# Patient Record
Sex: Female | Born: 1950 | Race: White | Hispanic: No | Smoking: Never smoker
Health system: Southern US, Community
[De-identification: ages and names within clinical notes are randomized; demographics above are authoritative.]

## PROBLEM LIST (undated history)

## (undated) DIAGNOSIS — G4730 Sleep apnea, unspecified: Secondary | ICD-10-CM

## (undated) DIAGNOSIS — K589 Irritable bowel syndrome without diarrhea: Secondary | ICD-10-CM

## (undated) DIAGNOSIS — IMO0001 Reserved for inherently not codable concepts without codable children: Secondary | ICD-10-CM

## (undated) DIAGNOSIS — R7303 Prediabetes: Secondary | ICD-10-CM

## (undated) DIAGNOSIS — M51369 Other intervertebral disc degeneration, lumbar region without mention of lumbar back pain or lower extremity pain: Secondary | ICD-10-CM

## (undated) DIAGNOSIS — R202 Paresthesia of skin: Secondary | ICD-10-CM

## (undated) DIAGNOSIS — R011 Cardiac murmur, unspecified: Secondary | ICD-10-CM

## (undated) DIAGNOSIS — I509 Heart failure, unspecified: Secondary | ICD-10-CM

## (undated) DIAGNOSIS — M069 Rheumatoid arthritis, unspecified: Secondary | ICD-10-CM

## (undated) DIAGNOSIS — M2550 Pain in unspecified joint: Secondary | ICD-10-CM

## (undated) DIAGNOSIS — M1990 Unspecified osteoarthritis, unspecified site: Secondary | ICD-10-CM

## (undated) DIAGNOSIS — R1310 Dysphagia, unspecified: Secondary | ICD-10-CM

## (undated) DIAGNOSIS — Z5189 Encounter for other specified aftercare: Secondary | ICD-10-CM

## (undated) DIAGNOSIS — D649 Anemia, unspecified: Secondary | ICD-10-CM

## (undated) DIAGNOSIS — R200 Anesthesia of skin: Secondary | ICD-10-CM

## (undated) DIAGNOSIS — K449 Diaphragmatic hernia without obstruction or gangrene: Secondary | ICD-10-CM

## (undated) DIAGNOSIS — G629 Polyneuropathy, unspecified: Secondary | ICD-10-CM

## (undated) DIAGNOSIS — K219 Gastro-esophageal reflux disease without esophagitis: Secondary | ICD-10-CM

## (undated) DIAGNOSIS — M797 Fibromyalgia: Secondary | ICD-10-CM

## (undated) DIAGNOSIS — R600 Localized edema: Secondary | ICD-10-CM

## (undated) DIAGNOSIS — E559 Vitamin D deficiency, unspecified: Secondary | ICD-10-CM

## (undated) DIAGNOSIS — G9009 Other idiopathic peripheral autonomic neuropathy: Secondary | ICD-10-CM

## (undated) DIAGNOSIS — M5030 Other cervical disc degeneration, unspecified cervical region: Secondary | ICD-10-CM

## (undated) DIAGNOSIS — M3500 Sicca syndrome, unspecified: Secondary | ICD-10-CM

## (undated) DIAGNOSIS — I5032 Chronic diastolic (congestive) heart failure: Secondary | ICD-10-CM

## (undated) DIAGNOSIS — C801 Malignant (primary) neoplasm, unspecified: Secondary | ICD-10-CM

## (undated) DIAGNOSIS — N189 Chronic kidney disease, unspecified: Secondary | ICD-10-CM

## (undated) DIAGNOSIS — M5136 Other intervertebral disc degeneration, lumbar region: Secondary | ICD-10-CM

## (undated) DIAGNOSIS — I10 Essential (primary) hypertension: Secondary | ICD-10-CM

## (undated) DIAGNOSIS — G4733 Obstructive sleep apnea (adult) (pediatric): Secondary | ICD-10-CM

## (undated) DIAGNOSIS — J189 Pneumonia, unspecified organism: Secondary | ICD-10-CM

## (undated) DIAGNOSIS — I493 Ventricular premature depolarization: Secondary | ICD-10-CM

## (undated) DIAGNOSIS — K829 Disease of gallbladder, unspecified: Secondary | ICD-10-CM

## (undated) DIAGNOSIS — U071 COVID-19: Secondary | ICD-10-CM

## (undated) DIAGNOSIS — I7300 Raynaud's syndrome without gangrene: Secondary | ICD-10-CM

## (undated) DIAGNOSIS — M549 Dorsalgia, unspecified: Secondary | ICD-10-CM

## (undated) DIAGNOSIS — R0602 Shortness of breath: Secondary | ICD-10-CM

## (undated) DIAGNOSIS — E7800 Pure hypercholesterolemia, unspecified: Secondary | ICD-10-CM

## (undated) DIAGNOSIS — L509 Urticaria, unspecified: Secondary | ICD-10-CM

## (undated) HISTORY — PX: BREAST SURGERY: SHX581

## (undated) HISTORY — DX: Anesthesia of skin: R20.0

## (undated) HISTORY — DX: COVID-19: U07.1

## (undated) HISTORY — DX: Polyneuropathy, unspecified: G62.9

## (undated) HISTORY — DX: Dorsalgia, unspecified: M54.9

## (undated) HISTORY — DX: Heart failure, unspecified: I50.9

## (undated) HISTORY — DX: Rheumatoid arthritis, unspecified: M06.9

## (undated) HISTORY — DX: Prediabetes: R73.03

## (undated) HISTORY — DX: Chronic diastolic (congestive) heart failure: I50.32

## (undated) HISTORY — DX: Other cervical disc degeneration, unspecified cervical region: M50.30

## (undated) HISTORY — DX: Other idiopathic peripheral autonomic neuropathy: G90.09

## (undated) HISTORY — DX: Pure hypercholesterolemia, unspecified: E78.00

## (undated) HISTORY — DX: Unspecified osteoarthritis, unspecified site: M19.90

## (undated) HISTORY — DX: Other intervertebral disc degeneration, lumbar region without mention of lumbar back pain or lower extremity pain: M51.369

## (undated) HISTORY — DX: Pain in unspecified joint: M25.50

## (undated) HISTORY — DX: Dysphagia, unspecified: R13.10

## (undated) HISTORY — DX: Essential (primary) hypertension: I10

## (undated) HISTORY — DX: Pneumonia, unspecified organism: J18.9

## (undated) HISTORY — PX: CHOLECYSTECTOMY: SHX55

## (undated) HISTORY — PX: TONSILLECTOMY: SUR1361

## (undated) HISTORY — DX: Sjogren syndrome, unspecified: M35.00

## (undated) HISTORY — PX: BREAST EXCISIONAL BIOPSY: SUR124

## (undated) HISTORY — DX: Disease of gallbladder, unspecified: K82.9

## (undated) HISTORY — DX: Urticaria, unspecified: L50.9

## (undated) HISTORY — PX: FEMUR FRACTURE SURGERY: SHX633

## (undated) HISTORY — DX: Shortness of breath: R06.02

## (undated) HISTORY — DX: Anesthesia of skin: R20.2

## (undated) HISTORY — DX: Fibromyalgia: M79.7

## (undated) HISTORY — PX: DIAGNOSTIC LAPAROSCOPY: SUR761

## (undated) HISTORY — PX: OTHER SURGICAL HISTORY: SHX169

## (undated) HISTORY — DX: Gastro-esophageal reflux disease without esophagitis: K21.9

## (undated) HISTORY — DX: Reserved for inherently not codable concepts without codable children: IMO0001

## (undated) HISTORY — PX: KNEE ARTHROSCOPY: SUR90

## (undated) HISTORY — DX: Raynaud's syndrome without gangrene: I73.00

## (undated) HISTORY — DX: Encounter for other specified aftercare: Z51.89

## (undated) HISTORY — DX: Ventricular premature depolarization: I49.3

## (undated) HISTORY — PX: ABDOMINAL HYSTERECTOMY: SHX81

## (undated) HISTORY — DX: Other intervertebral disc degeneration, lumbar region: M51.36

## (undated) HISTORY — DX: Cardiac murmur, unspecified: R01.1

## (undated) HISTORY — DX: Irritable bowel syndrome, unspecified: K58.9

## (undated) HISTORY — DX: Localized edema: R60.0

## (undated) HISTORY — DX: Diaphragmatic hernia without obstruction or gangrene: K44.9

## (undated) HISTORY — DX: Sleep apnea, unspecified: G47.30

## (undated) HISTORY — DX: Obstructive sleep apnea (adult) (pediatric): G47.33

## (undated) HISTORY — DX: Vitamin D deficiency, unspecified: E55.9

## (undated) HISTORY — PX: TUBAL LIGATION: SHX77

## (undated) HISTORY — PX: ADENOIDECTOMY: SUR15

---
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
## 1978-08-14 HISTORY — PX: TENDON REPAIR: SHX5111

## 1998-04-16 ENCOUNTER — Encounter: Admission: RE | Admit: 1998-04-16 | Discharge: 1998-07-15 | Payer: Self-pay | Admitting: Endocrinology

## 1999-03-18 ENCOUNTER — Encounter: Payer: Self-pay | Admitting: *Deleted

## 1999-03-18 ENCOUNTER — Ambulatory Visit (HOSPITAL_COMMUNITY): Admission: RE | Admit: 1999-03-18 | Discharge: 1999-03-18 | Payer: Self-pay | Admitting: *Deleted

## 1999-04-07 ENCOUNTER — Ambulatory Visit (HOSPITAL_COMMUNITY): Admission: RE | Admit: 1999-04-07 | Discharge: 1999-04-07 | Payer: Self-pay | Admitting: *Deleted

## 1999-04-07 ENCOUNTER — Encounter: Payer: Self-pay | Admitting: *Deleted

## 1999-04-22 ENCOUNTER — Ambulatory Visit (HOSPITAL_COMMUNITY): Admission: RE | Admit: 1999-04-22 | Discharge: 1999-04-22 | Payer: Self-pay | Admitting: *Deleted

## 1999-04-22 ENCOUNTER — Encounter (INDEPENDENT_AMBULATORY_CARE_PROVIDER_SITE_OTHER): Payer: Self-pay | Admitting: Specialist

## 1999-06-20 ENCOUNTER — Other Ambulatory Visit: Admission: RE | Admit: 1999-06-20 | Discharge: 1999-06-20 | Payer: Self-pay | Admitting: Otolaryngology

## 1999-07-18 ENCOUNTER — Ambulatory Visit (HOSPITAL_BASED_OUTPATIENT_CLINIC_OR_DEPARTMENT_OTHER): Admission: RE | Admit: 1999-07-18 | Discharge: 1999-07-19 | Payer: Self-pay | Admitting: Otolaryngology

## 1999-07-18 ENCOUNTER — Encounter (INDEPENDENT_AMBULATORY_CARE_PROVIDER_SITE_OTHER): Payer: Self-pay | Admitting: Specialist

## 2001-05-03 ENCOUNTER — Encounter: Payer: Self-pay | Admitting: Gastroenterology

## 2001-05-03 ENCOUNTER — Ambulatory Visit (HOSPITAL_COMMUNITY): Admission: RE | Admit: 2001-05-03 | Discharge: 2001-05-03 | Payer: Self-pay | Admitting: Gastroenterology

## 2001-05-08 ENCOUNTER — Encounter (INDEPENDENT_AMBULATORY_CARE_PROVIDER_SITE_OTHER): Payer: Self-pay | Admitting: *Deleted

## 2001-05-08 ENCOUNTER — Ambulatory Visit (HOSPITAL_COMMUNITY): Admission: RE | Admit: 2001-05-08 | Discharge: 2001-05-08 | Payer: Self-pay | Admitting: Gastroenterology

## 2001-05-21 ENCOUNTER — Encounter: Payer: Self-pay | Admitting: Gastroenterology

## 2001-05-21 ENCOUNTER — Ambulatory Visit (HOSPITAL_COMMUNITY): Admission: RE | Admit: 2001-05-21 | Discharge: 2001-05-21 | Payer: Self-pay | Admitting: Gastroenterology

## 2001-12-06 ENCOUNTER — Encounter: Admission: RE | Admit: 2001-12-06 | Discharge: 2001-12-06 | Payer: Self-pay | Admitting: Otolaryngology

## 2001-12-06 ENCOUNTER — Encounter: Payer: Self-pay | Admitting: Otolaryngology

## 2003-02-06 ENCOUNTER — Encounter (INDEPENDENT_AMBULATORY_CARE_PROVIDER_SITE_OTHER): Payer: Self-pay | Admitting: *Deleted

## 2003-02-06 ENCOUNTER — Ambulatory Visit (HOSPITAL_COMMUNITY): Admission: RE | Admit: 2003-02-06 | Discharge: 2003-02-06 | Payer: Self-pay | Admitting: Gastroenterology

## 2003-03-06 ENCOUNTER — Ambulatory Visit (HOSPITAL_COMMUNITY): Admission: RE | Admit: 2003-03-06 | Discharge: 2003-03-06 | Payer: Self-pay | Admitting: Gastroenterology

## 2003-03-06 ENCOUNTER — Encounter (INDEPENDENT_AMBULATORY_CARE_PROVIDER_SITE_OTHER): Payer: Self-pay | Admitting: Specialist

## 2003-12-10 ENCOUNTER — Encounter: Admission: RE | Admit: 2003-12-10 | Discharge: 2003-12-10 | Payer: Self-pay | Admitting: Rheumatology

## 2004-05-30 ENCOUNTER — Ambulatory Visit (HOSPITAL_COMMUNITY): Admission: RE | Admit: 2004-05-30 | Discharge: 2004-05-30 | Payer: Self-pay | Admitting: Internal Medicine

## 2004-09-02 ENCOUNTER — Encounter: Payer: Self-pay | Admitting: Gastroenterology

## 2005-06-09 ENCOUNTER — Ambulatory Visit (HOSPITAL_COMMUNITY): Admission: RE | Admit: 2005-06-09 | Discharge: 2005-06-09 | Payer: Self-pay | Admitting: Gastroenterology

## 2005-06-09 ENCOUNTER — Encounter (INDEPENDENT_AMBULATORY_CARE_PROVIDER_SITE_OTHER): Payer: Self-pay | Admitting: *Deleted

## 2007-02-11 ENCOUNTER — Encounter: Payer: Self-pay | Admitting: Cardiology

## 2007-10-13 ENCOUNTER — Emergency Department (HOSPITAL_COMMUNITY): Admission: EM | Admit: 2007-10-13 | Discharge: 2007-10-13 | Payer: Self-pay | Admitting: Emergency Medicine

## 2009-03-29 ENCOUNTER — Encounter: Admission: RE | Admit: 2009-03-29 | Discharge: 2009-03-29 | Payer: Self-pay | Admitting: Geriatric Medicine

## 2009-12-14 ENCOUNTER — Encounter: Admission: RE | Admit: 2009-12-14 | Discharge: 2009-12-14 | Payer: Self-pay | Admitting: Otolaryngology

## 2010-04-06 ENCOUNTER — Encounter: Admission: RE | Admit: 2010-04-06 | Discharge: 2010-04-06 | Payer: Self-pay | Admitting: Rheumatology

## 2010-04-11 ENCOUNTER — Encounter: Admission: RE | Admit: 2010-04-11 | Discharge: 2010-04-11 | Payer: Self-pay | Admitting: Internal Medicine

## 2010-12-30 NOTE — Op Note (Signed)
NAME:  [name], [name]                          ACCOUNT NO.:  [id number]   MEDICAL RECORD NO.:  [id number]                   PATIENT TYPE:  AMB   LOCATION:  ENDO                                 FACILITY:  [facility]   PHYSICIAN:  Nelwyn Salisbury, M.D.               DATE OF BIRTH:  13-Jul-1951   DATE OF PROCEDURE:  02/06/2003  DATE OF DISCHARGE:                                 OPERATIVE REPORT   PROCEDURE:  Esophagogastroduodenoscopy with cold biopsies.   ENDOSCOPIST:  Nelwyn Salisbury, M.D.   INSTRUMENT USED:  Olympus video panendoscope.   INDICATIONS FOR PROCEDURE:  History of Barrett's mucosa and ongoing reflux  with diarrhea and abdominal discomfort mostly in the epigastric and  periumbilical area in a 60 year old white female, rule out peptic ulcer  disease, esophagitis, gastritis, etc.   PREPROCEDURE PREPARATION:  Informed consent was procured from the patient.  The patient fasted for eight hours prior to the procedure.   PREPROCEDURE PHYSICAL EXAMINATION:  VITAL SIGNS: Stable.  NECK:  Supple.  CHEST: Clear to auscultation. S1 and S2 regular.  ABDOMEN: Soft with normal bowel sounds.  Obese.  There is some periumbilical  tenderness on palpation.   DESCRIPTION OF PROCEDURE:  The patient was placed in the left lateral  decubitus position and sedated with 100 mcg of fentanyl and 10 mg of Versed  intravenously. Once the patient was adequately sedated and maintained on low  flow oxygen and continuous cardiac monitoring, the Olympus video  panendoscope was advanced through the mouthpiece, over the tongue, and into  the esophagus under direct vision. The entire esophagus appeared normal with  no evidence of ring, stricture, mass, esophagitis, or Barrett's mucosa. The  Barrett's mucosa seen on previous endoscopy was not longer evident. The  scope was then advanced to the stomach. A small hiatal hernia was seen on  high retroflexion. A small sessile polyp was biopsied from the mid body  of  the stomach, a flat lesion was biopsied from the antrum. No erosions,  ulcerations, masses, or polyps were seen.  Random small bowel biopsies were  done from the post bulbar area to evaluate the patient's diarrhea and rule  out sprue.  No ulcers or erosions were seen in the proximal small bowel. The  patient tolerated the procedure well without complications.   IMPRESSION:  1. Normal appearing esophagus and proximal small bowel.  2. Random small bowel biopsies done to evaluate the patient's diarrhea.  3. Small sessile polyp biopsied from mid body of the stomach and a flat     lesion biopsied from the antrum.  This bled easily, but the bleeding     stopped and this was confirmed before the scope was withdrawn.  4. Small hiatal hernia.   RECOMMENDATIONS:  1. Continue PPI's as before.  2. Await pathology results.  Further recommendations made thereafter.  3.     Avoid nonsteroidals.  4.  Antireflux measures and a weight loss program were advocated.  5. Outpatient follow-up in the next two weeks for further recommendations.                                               Nelwyn Salisbury, M.D.    JNM/MEDQ  D:  02/06/2003  T:  02/07/2003  Job:  [id number]   cc:   Harriette Bouillon, M.D.  [place] [place]  [place] [address]  Fax: [phone]

## 2010-12-30 NOTE — Op Note (Signed)
[place]. [facility]  Patient:    [name]                        MRN: [id number] Proc. Date: 07/18/99 Adm. Date:  [id number] Attending:  Beckie Salts CC:         Donneta Romberg, M.D.                           Operative Report  PREOPERATIVE DIAGNOSIS:  Left parotid mass.  POSTOPERATIVE DIAGNOSIS:  Left parotid mass.  PROCEDURE:  Left superficial parotidectomy with dissection and preservation of he facial nerve.  SURGEON:  Jefry H. Constance Holster, M.D.  ASSISTANT:  Windell Moment, M.D.  ANESTHESIA:  General endotracheal anesthesia.  COMPLICATIONS:  None.  FINDINGS:  Multilobular mass approximately 3-4 cm in diameter.  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  ESTIMATED BLOOD LOSS:  30 cc.  REFERRING PHYSICIAN:  Donneta Romberg, M.D.  BRIEF HISTORY:  This is a 60 year old lady with a history of a left-sided parotid mass.  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  The risks, benefits, alternatives, and complications of the procedure were explained to the patient, and she seemed to understand and agreed to surgery.  DESCRIPTION OF PROCEDURE:  The patient was taken to the operating room and placed on the operating table in the supine position.  Following the induction of general endotracheal anesthesia, the table was turned 90 degrees, and the patients face  was prepped and draped in a standard fashion.  A parotidectomy incision was outlined in a preauricular crease down behind the earlobe, around the mastoid process and down into a cervical crease.  Electrocautery was used to incision the skin and subcutaneous tissue.  A flap was developed anteriorly, just superficial to the parotid fascia.  The great auricular nerve was identified, and the posterior branch to the auricle was dissected out and preserved.  The parotid tail was cleaned off of the upper sternocleidomastoid muscle.  The  gland was then brought forward off of the tragal cartilage and the ear canal.  The posterior belly of he digastric nerve was identified.  The tympanomastoid suture line was identified.  Careful dissection in this area using hemostats, bipolar cautery, and dissecting scissors were used to identify the main trunk of the facial nerve.  The main trunk was then dissected out off of the overlying parotid tissue, out to the pes anserinus.  All five branches of the nerve were then dissected carefully, taking the parotid gland off laterally.  The mass was in between the upper and lower division, right at the pes, and great care was taken in this area of the dissection to make sure not to disrupt the capsule of the tumor.  There was a separate lymph node identified in the superior anterior aspect of the parotid bed that was removed as well.  The entire lateral lobe was dissected free of the five branches and sent for pathologic evaluation.  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 Then 4-0 silk ties and bipolar cautery were used as needed for hemostasis of the parotid bed.  The main trunk and upper and lower divisions were all able to be stimulated at 0.5 mA.  The wound was irrigated with saline and closed in layers  using 4-0 chromic subcutaneous and running 5-0 nylon on the skin.  A #7 [place] round Jackson-Pratt drain was left in  the wound, exited through the inferior aspect of the incision, and secured in place with a nylon suture.  The patient was awakened, extubated, and transferred to the recovery room in stable condition. DD:  07/18/99 TD:  07/19/99 Job: [id number]

## 2010-12-30 NOTE — Op Note (Signed)
[place]. [facility]  Patient:    [name], [name] Visit Number: [id number] MRN: [id number]          Service Type: END Location: ENDO Attending Physician:  Juanita Craver Proc. Date: 05/08/01 Admit Date:  05/08/2001   CC:         Sandy Salaam. Chevis Pretty., M.D.   Operative Report  DATE OF BIRTH:  03/17/1951  PROCEDURE:  Colonoscopy.  ENDOSCOPIST:  Nelwyn Salisbury, M.D.  INSTRUMENT:  Olympus video colonoscope.  INDICATIONS:  Rectal bleeding in a 60 year old white female, rule out colonic polyps, masses, hemorrhoids, etc.  PREPROCEDURE PREPARATION:  Informed consent was procured from the patient. Tpx was fasted for eight hours prior to the procedure and prepped with a bottle magnesium citrate and a gallon of Nulytely the night prior to the procedure.  PREPROCEDURE PHYSICAL EXAMINATION:  VITAL SIGNS: Stable.  NECK: Supple. CHEST: Clear to auscultation.  S1 and S2 regular.  ABDOMEN: Soft with normal bowel sounds.  DESCRIPTION OF PROCEDURE:  The patient was placed in the left lateral decubitus position and sedated with an additional 30 mg of Demerol and 3.5 mg of Versed intravenously.  Once the patient was adequately sedated and maintained on low flow oxygen and continuous cardiac monitoring, the Olympus video colonoscope was advanced from the rectum to cecum and terminal ileum without difficulty.  The entire colonic mucosa appeared healthy with a normal vascular pattern.  No erosions, ulcerations, masses, or polyps were seen. There was one isolated diverticulum seen in the left colon at about 30 cm.  IMPRESSION: 1. 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 2. Otherwise healthy appearing colon from the rectum to cecum and terminal    ileum.  RECOMMENDATIONS:  Small bowel followthrough will be done to further evaluation the patients symptoms and recommendations made as needed. Attending Physician:  Juanita Craver DD:  05/08/01 TD:   05/08/01 Job: [id number]

## 2010-12-30 NOTE — Op Note (Signed)
NAME:  NOVELLA, HUBLEY                          ACCOUNT NO.:  [id number]   MEDICAL RECORD NO.:  [id number]                   PATIENT TYPE:  AMB   LOCATION:  ENDO                                 FACILITY:  [facility]   PHYSICIAN:  Nelwyn Salisbury, M.D.               DATE OF BIRTH:  Feb 04, 1951   DATE OF PROCEDURE:  03/06/2003  DATE OF DISCHARGE:                                 OPERATIVE REPORT   PROCEDURE PERFORMED:  Colonoscopy with random biopsies.   ENDOSCOPIST:  Nelwyn Salisbury, M.D.   INSTRUMENT USED:  Olympus video colonoscope.   INDICATION FOR PROCEDURE:  A 60 year old white female with a history of  rectal bleeding and change in bowel habits, severe diarrhea, and abdominal  pain.  Rule out polyps, masses, etc.  Random biopsies are planned to rule  out collagenous versus microscopic colitis if the mucosa appears normal.   PREPROCEDURE PREPARATION:  Informed consent was procured from the patient.  The patient had fasted for eight hours prior to the procedure and prepped  with a bottle of magnesium citrate and a gallon of GoLYTELY the night prior  to the procedure.   PREPROCEDURE PHYSICAL:  VITAL SIGNS:  The patient had stable vital signs.  NECK:  Supple.  CHEST:  Clear to auscultation.  S1, S2 regular.  ABDOMEN:  Soft with normal bowel sounds.   DESCRIPTION OF PROCEDURE:  The patient was placed in the left lateral  decubitus position and sedated with 60 mcg of fentanyl and 6 mg of Versed  intravenously.  Once the patient was adequately sedate and maintained on low-  flow oxygen and continuous cardiac monitoring, the Olympus video colonoscope  was advanced from the rectum to the cecum and terminal ileum.  There was a  significant amount of residual stool in the colon.  Multiple washes were  done.  No masses, polyps, erosions, ulcerations, or diverticula were seen.  The appendiceal orifice and the ileocecal valve were identified and  photographed.  The terminal ileum appeared  normal and without lesions.  Retroflexion in the rectum revealed no abnormalities.  Random colon biopsies  were done to rule out collagenous versus microscopic colitis.  The patient  tolerated the procedure well without complications.  Small lesions could  been missed secondary to a relatively poor prep.   IMPRESSION:  1. Normal colonoscopy up to the terminal ileum.  No masses, polyps,     diverticulosis, erosions, or ulcerations noted.  2. Random colon biopsies done to rule out microscopic versus collagenous     colitis.   RECOMMENDATIONS:  1. Await pathology results.  2.     Continue a high-fiber diet with liberal fluid intake.  3. Outpatient follow-up in the next two weeks.  We will try to procure the     results of a 24-hour urine study for 5HIAA and make further     recommendations in follow-up.  Nelwyn Salisbury, M.D.    JNM/MEDQ  D:  03/06/2003  T:  03/07/2003  Job:  [id number]   cc:   Harriette Bouillon, M.D.  [place]. [address] [place]  [place] [address]  Fax: [phone]

## 2010-12-30 NOTE — Op Note (Signed)
[place]. [facility]  Patient:    [name], [name] Visit Number: [id number] MRN: [id number]          Service Type: END Location: ENDO Attending Physician:  Juanita Craver Proc. Date: 05/08/01 Admit Date:  05/08/2001   CC:         Sandy Salaam. Chevis Pretty., M.D.   Operative Report  DATE OF BIRTH:  1951-06-27  PROCEDURE:  Esophagogastroduodenoscopy with biopsies.  ENDOSCOPIST:  Nelwyn Salisbury, M.D.  INSTRUMENT:  Olympus video panendoscope.  INDICATIONS:  Previous history of Barretts in a 60 year old white female whose repeat EGD is being done in two years to rule out dysplasia.  PREPROCEDURE PREPARATION:  Informed consent was procured from the patient. The patient was fasted for eight hours prior to the procedure.  PREPROCEDURE PHYSICAL EXAMINATION:  The patient had stable vital signs.  NECK: Supple.  CHEST: Clear to auscultation.  S1 and S2 regular.  ABDOMEN: Soft with normal bowel sounds.  DESCRIPTION OF PROCEDURE:  The patient was placed in the left lateral decubitus position and sedated with 40 mg of Demerol and 4 mg of Versed intravenously.  Once the patient was adequately sedated and maintained on low flow oxygen and continuous cardiac monitoring, the Olympus video panendoscope was advanced through the mouthpiece, over the tongue, and into the esophagus under direct vision.  A small patch of what appeared to be Barretts mucosa was biopsied above the Z-line.  No other abnormalities were seen in the esophagus.  The Z-line was otherwise healthy.  On advancing the scope into the stomach, there were some prominent folds seen in the antrum.  These were biopsied for pathology.  No ulcers, erosions, masses, or polyps were seen. The proximal small bowel appeared normal.  IMPRESSION: 1. Question Barretts mucosa above the Z-line, biopsies done, results pending,    otherwise normal esophagus. 2. Prominent gastric folds in the antrum, biopsies  done, results pending. 3. No erosions, ulcerations, masses, or polyps seen.  RECOMMENDATIONS:  Await pathology results.  Proceed with colonoscopy at this time. Attending Physician:  Juanita Craver DD:  05/08/01 TD:  05/08/01 Job: [id number]

## 2010-12-30 NOTE — Op Note (Signed)
[name], [name]                ACCOUNT NO.:  [id number]   MEDICAL RECORD NO.:  [id number]          PATIENT TYPE:  AMB   LOCATION:  ENDO                         FACILITY:  [facility]   PHYSICIAN:  Nelwyn Salisbury, M.D.  DATE OF BIRTH:  June 28, 1951   DATE OF PROCEDURE:  06/09/2005  DATE OF DISCHARGE:                                 OPERATIVE REPORT   PROCEDURE:  Esophagogastroduodenoscopy with antral biopsy.   ENDOSCOPIST:  Juanita Craver, M.D.   INSTRUMENT USED:  Olympus video panendoscope.   INDICATIONS FOR PROCEDURE:  60 year old white female with a history of  Barrett's mucosa undergoing repeat EGD to rule out dysphagia.   PREPROCEDURE PREPARATION:  Informed consent was obtained from the patient.  The patient was fasted for eight hours prior to the procedure.   PREPROCEDURE PHYSICAL:  Patient with stable vital signs.  Neck supple.  Chest clear to auscultation.  S1 and S2 regular.  Abdomen soft with normal  bowel sounds.   DESCRIPTION OF PROCEDURE:  The patient was placed in the left lateral  decubitus position, sedated with 60 mcg of Fentanyl and 5 mg Versed in slow  incremental doses.  Once the patient was adequately sedated, maintained on  low flow oxygen and continuous cardiac monitoring, the Olympus video  panendoscope was advanced through the mouth piece over the tongue into the  esophagus under direct vision.  The entire esophagus appeared very healthy  with no evidence of ring, strictures, masses, esophagitis, or Barrett's  mucosa.  The scope was then advanced into the stomach.  Antral gastritis  with erosions were noted.  Biopsies were done to rule out the presence of H.  pylori.  The proximal small bowel appeared normal.  Retroflexion in the high  cardia revealed no abnormalities.   IMPRESSION:  1.Normal appearing esophagus and proximal small bowel.  2.Antral gastritis with erosions noted, biopsies done, results pending.   RECOMMENDATIONS:  1.Await pathology results.  2.Continue PPIs.  3.Treat with antibiotics if H. pylori present on biopsies.  4.Repeat EGD in the next two years or earlier if needed.      Nelwyn Salisbury, M.D.  Electronically Signed     JNM/MEDQ  D:  06/09/2005  T:  06/09/2005  Job:  [id number]   cc:   Harriette Bouillon, M.D.  Fax: [phone]

## 2011-04-04 ENCOUNTER — Encounter: Payer: Self-pay | Admitting: Cardiology

## 2011-06-13 ENCOUNTER — Encounter (HOSPITAL_COMMUNITY): Payer: Managed Care, Other (non HMO)

## 2011-06-13 ENCOUNTER — Ambulatory Visit (HOSPITAL_COMMUNITY)
Admission: RE | Admit: 2011-06-13 | Discharge: 2011-06-13 | Disposition: A | Discharge status: Home / Self Care | Payer: Managed Care, Other (non HMO) | Source: Ambulatory Visit | Attending: Orthopedic Surgery | Admitting: Orthopedic Surgery

## 2011-06-13 ENCOUNTER — Other Ambulatory Visit (HOSPITAL_COMMUNITY): Payer: Self-pay | Admitting: Orthopedic Surgery

## 2011-06-13 ENCOUNTER — Encounter (HOSPITAL_COMMUNITY): Payer: Self-pay

## 2011-06-13 DIAGNOSIS — I519 Heart disease, unspecified: Secondary | ICD-10-CM | POA: Insufficient documentation

## 2011-06-13 DIAGNOSIS — Z01818 Encounter for other preprocedural examination: Secondary | ICD-10-CM

## 2011-06-13 DIAGNOSIS — E119 Type 2 diabetes mellitus without complications: Secondary | ICD-10-CM | POA: Insufficient documentation

## 2011-06-13 DIAGNOSIS — Z01812 Encounter for preprocedural laboratory examination: Secondary | ICD-10-CM | POA: Insufficient documentation

## 2011-06-13 DIAGNOSIS — I517 Cardiomegaly: Secondary | ICD-10-CM | POA: Insufficient documentation

## 2011-06-13 LAB — URINALYSIS, ROUTINE W REFLEX MICROSCOPIC
Nitrite: NEGATIVE
Specific Gravity, Urine: 1.018 (ref 1.005–1.030)
Urobilinogen, UA: 0.2 mg/dL (ref 0.0–1.0)
pH: 6 (ref 5.0–8.0)

## 2011-06-13 LAB — SURGICAL PCR SCREEN
MRSA, PCR: NEGATIVE
Staphylococcus aureus: NEGATIVE

## 2011-06-13 LAB — COMPREHENSIVE METABOLIC PANEL
BUN: 13 mg/dL (ref 6–23)
Calcium: 9.8 mg/dL (ref 8.4–10.5)
Creatinine, Ser: 0.7 mg/dL (ref 0.50–1.10)
GFR calc Af Amer: >90 mL/min (ref >90)
Glucose, Bld: 86 mg/dL (ref 70–99)
Total Protein: 6.7 g/dL (ref 6.0–8.3)

## 2011-06-13 LAB — PROTIME-INR
INR: 0.96 (ref 0.00–1.49)
Prothrombin Time: 13 seconds (ref 11.6–15.2)

## 2011-06-13 LAB — CBC
HCT: 44.4 % (ref 36.0–46.0)
Hemoglobin: 14.9 g/dL (ref 12.0–15.0)
MCHC: 33.6 g/dL (ref 30.0–36.0)
MCV: 88.1 fL (ref 78.0–100.0)
WBC: 5.5 10*3/uL (ref 4.0–10.5)

## 2011-06-13 LAB — APTT: aPTT: 30 seconds (ref 24–37)

## 2011-06-13 NOTE — Patient Instructions (Signed)
20 AD[address] W[address]ST  06/13/2011   Your procedure is scheduled on:  06/21/11  Report to [facility] at [place] AM.  Call this number if you have problems the morning of surgery: [phone]   Remember:   Do not eat food:After Midnight. Tuesday night  Do not drink clear liquids: After Midnight.Tuesday night  Take these medicines the morning of surgery with A SIP OF WATER: Nexium with sip water, Vicodin with sip water if needed   Do not wear jewelry, make-up or nail polish.  Do not wear lotions, powders, or perfumes. You may wear deodorant.  Do not shave 48 hours prior to surgery.  Do not bring valuables to the hospital.  Contacts, dentures or bridgework may not be worn into surgery.  Leave suitcase in the car. After surgery it may be brought to your room.  For patients admitted to the hospital, checkout time is 11:00 AM the day of discharge.              Regular soap face and privates,  No shaving x 48 hrs pre soap shower  Patients discharged the day of surgery will not be allowed to drive home.  Name and phone number of your driver: husband/- going to [employer] in [place] and bring it with you on the day of surgery.   Please read over the following fact sheets that you were given: Blood Transfusion Information

## 2011-06-14 NOTE — Pre-Procedure Instructions (Signed)
Eccho report and stress test 8/12 on chart with clearence Dr Grace Isaac RN

## 2011-06-16 MED ORDER — CHLORHEXIDINE GLUCONATE 4 % EX LIQD: 60.0000 mL | Freq: Once | CUTANEOUS | Status: DC

## 2011-06-20 ENCOUNTER — Other Ambulatory Visit: Payer: Self-pay | Admitting: Orthopedic Surgery

## 2011-06-20 MED ORDER — BUPIVACAINE 0.25 % ON-Q PUMP SINGLE CATH 300ML: 300.0000 mL | INJECTION | Status: DC

## 2011-06-20 MED ORDER — BUPIVACAINE 0.25 % ON-Q PUMP SINGLE CATH 300ML
300.0000 mL | INJECTION | Status: DC
  Administered 2011-06-21: 300 mL
  Filled 2011-06-20: qty 300

## 2011-06-20 NOTE — H&P (Signed)
[name] DOB: 09-Oct-1950  Date of Admission: 06/21/2011   History of Present Illness The patient is a 60 year old female who comes in today for a preoperative History and Physical. The patient is scheduled for a right total knee arthroplasty to be performed by Dr. Dione Plover. Aluisio, MD at [facility] on 06/21/2011 .  Allergies IV contrast DEMEROL. IV ADHESIVE. (CAN USE PAPER TAPE AND STERI-STRIPS) Morphine Derivatives. NO ALLERGY but intolerance - not very effective  Medications Lyrica Phenergan Plaquenil Nexium Vicodin Folic Acid Hyscomine Lidocaine Vit. D Enbrel  Family History Cancer. father and grandmother fathers side Cerebrovascular Accident. grandfather mothers side mother and grandfather mothers side Chronic Obstructive Lung Disease. father Congestive Heart Failure. grandmother fathers side and grandfather fathers side sister and grandfather fathers side Depression. mother Heart Disease. mother, brother, grandmother fathers side and grandfather fathers side mother, sister, grandmother fathers side and grandfather fathers side Heart disease in female family member before age 72 Hypertension. mother, brother, grandfather mothers side, grandmother fathers side and grandfather fathers side mother, brother, grandmother mothers side, grandmother fathers side, grandfather fathers side and child Osteoarthritis. mother, brother, grandmother mothers side and grandmother fathers side Rheumatoid Arthritis. grandmother fathers side Severe allergy. child   Social History Alcohol use. current drinker; drinks beer, wine and hard liquor; only occasionally per week current drinker; drinks wine and hard liquor; only occasionally per week Children. 2 Current work status. disabled Drug/Alcohol Rehab (Currently). no Copy of Drug/Alcohol Rehab (Previously). no Exercise. Exercises rarely; does running / walking and other Exercises  weekly; does running / walking Illicit drug use. no Living situation. live with spouse Marital status. married Number of flights of stairs before winded. greater than 5 2-3 Pain Contract. yes Tobacco / smoke exposure. no yes outdoors only Tobacco use. never smoker   Past Surgical History Ankle Surgery. Date: 77. left, tendon repair Appendectomy Arthroscopy of Knee. right, 1998 and 2001 Breast Biopsy. left Colon Polyp Removal - Colonoscopy Dilation and Curettage of Uterus Gallbladder Surgery. laporoscopic Hysterectomy. Date: 73. partial (non-cancerous) Tonsillectomy. Date: 31. Tubal Ligation Paotid Gland Tumor Removal (Noncancerous) . Date: 28. Ovary Removal - Both Bladder Tack Procedure. Date: 64. Wisdom Teeth Extraction. Date: 58.  Medical History Cardiac Arrhythmia . Frequent PVC's Chronic Pain Diabetes Mellitus, Type II. Diet-Controlled Fibromyalgia Gastroesophageal Reflux Disease Hypercholesterolemia Irritable bowel syndrome Migraine Headache Osteoarthritis Osteoporosis Peripheral Neuropathy Rheumatoid Arthritis Skin Cancer. Basal Cell Sleep Apnea. CPAP Barrett's Esophagus Hiatal Hernia Past History of Gastritis Urinary Incontinence. 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 Skin:Not Present- Itching, Rash, Skin Color Changes, Ulcer, Psoriasis and Change in Hair or Nails. HEENT:Not Present- Sensitivity to light, Hearing problems, Nose Bleed and Ringing in the Ears. Neck:Present- Swollen Glands. Not Present- Neck Mass. Respiratory:Not Present- Snoring, Chronic Cough, Bloody sputum and Dyspnea. Cardiovascular:Present- Leg Cramps and Palpitations. Not Present- Shortness of Breath, Chest Pain and Swelling of  Extremities. Gastrointestinal:Not Present- Bloody Stool, Heartburn, Abdominal Pain, Vomiting, Nausea and Incontinence of Stool. Female Genitourinary:Not Present- Blood in Urine, Menstrual Irregularities, Frequency, Incontinence and Nocturia. Musculoskeletal:Present- Joint Stiffness and Joint Pain. Not Present- Muscle Weakness, Muscle Pain, Joint Swelling and Back Pain. Neurological:Present- Tingling and Numbness. Not Present- Burning, Tremor, Headaches and Dizziness. Psychiatric:Not Present- Anxiety, Depression and Memory Loss. Endocrine:Present- Cold Intolerance. Not Present- Heat Intolerance, Excessive hunger and Excessive Thirst. Hematology:Present- Easy Bruising. Not Present-  Abnormal Bleeding, Anemia and Blood Clots.   Vitals Weight: 239 lb Height: 61 in Weight was reported by patient. Height was reported by patient. Body Surface Area: 2.16 m Body Mass Index: 45.16 kg/m Pulse: 68 (Regular) Resp.: 12 (Unlabored) BP: 118/72 (Sitting, Right Arm, Standard)    Physical Exam The physical exam findings are as follows:GENERAL: Patient is a 60 y.o. female, well-nourished, well-developed, no acute distress. Alert, oriented, cooperative. HENT:  Normocephalic, atraumatic. Pupils round and reactive. EOMs intact. NECK:  Supple, no bruits. CHEST:  Clear to anterior and posterior chest walls. No rhonchi, rales, wheezes.  Scoliosis noted. HEART:  Regular, rate and rhythm with occasional ectopic beat.  No murmurs.  S1 and S2 noted. ABDOMEN:  Soft, nontender, bowel sounds present. RECTAL/BREAST/GENITALIA:  Not done, not pertinent to present illness. EXTREMITIES:  Lower Extremity Knee/Patella:Examination of right knee reveals - no swelling or edema. Inspection and Palpation:Crepitus- coarse, (R). Examination reveals - no ecchymosis, (R). Assessment of pain reveals the following findings:: Location:Right- over the lateral aspect and over the medial  aspect. ROJM: Right: Flexion:AROM- 95 . Extension:AROM- 10 .   Assessment & Plan Osteoarthritis, knee (715.96) Impression: Right Knee  Patient to undergo a Right Total Knee Replacement. Risks and benefits of the surgery have been discussed with the patient and they elect to proceed with surgery.  There are on active contraindications to upcoming procedure such as ongoing infection or progressive neurological disease. Patient wants to look into skilled rehab, possible [facility].

## 2011-06-21 ENCOUNTER — Encounter (HOSPITAL_COMMUNITY)
Admission: RE | Disposition: A | Discharge status: Skilled Nursing Facility | Payer: Self-pay | Source: Ambulatory Visit | Attending: Orthopedic Surgery

## 2011-06-21 ENCOUNTER — Inpatient Hospital Stay (HOSPITAL_COMMUNITY): Payer: Managed Care, Other (non HMO) | Admitting: Anesthesiology

## 2011-06-21 ENCOUNTER — Encounter (HOSPITAL_COMMUNITY): Payer: Self-pay | Admitting: *Deleted

## 2011-06-21 ENCOUNTER — Encounter (HOSPITAL_COMMUNITY): Payer: Self-pay | Admitting: Orthopedic Surgery

## 2011-06-21 ENCOUNTER — Inpatient Hospital Stay (HOSPITAL_COMMUNITY)
Admission: RE | Admit: 2011-06-21 | Discharge: 2011-06-24 | DRG: 470 | Disposition: A | Discharge status: Skilled Nursing Facility | Payer: Managed Care, Other (non HMO) | Source: Ambulatory Visit | Attending: Orthopedic Surgery | Admitting: Orthopedic Surgery

## 2011-06-21 ENCOUNTER — Encounter (HOSPITAL_COMMUNITY): Payer: Self-pay | Admitting: Anesthesiology

## 2011-06-21 DIAGNOSIS — Z96651 Presence of right artificial knee joint: Secondary | ICD-10-CM

## 2011-06-21 DIAGNOSIS — K219 Gastro-esophageal reflux disease without esophagitis: Secondary | ICD-10-CM | POA: Diagnosis present

## 2011-06-21 DIAGNOSIS — M1711 Unilateral primary osteoarthritis, right knee: Secondary | ICD-10-CM

## 2011-06-21 DIAGNOSIS — IMO0001 Reserved for inherently not codable concepts without codable children: Secondary | ICD-10-CM | POA: Diagnosis present

## 2011-06-21 DIAGNOSIS — E119 Type 2 diabetes mellitus without complications: Secondary | ICD-10-CM | POA: Diagnosis present

## 2011-06-21 DIAGNOSIS — M1712 Unilateral primary osteoarthritis, left knee: Secondary | ICD-10-CM

## 2011-06-21 DIAGNOSIS — M3500 Sicca syndrome, unspecified: Secondary | ICD-10-CM | POA: Diagnosis present

## 2011-06-21 DIAGNOSIS — G609 Hereditary and idiopathic neuropathy, unspecified: Secondary | ICD-10-CM | POA: Diagnosis present

## 2011-06-21 DIAGNOSIS — E7800 Pure hypercholesterolemia, unspecified: Secondary | ICD-10-CM | POA: Diagnosis present

## 2011-06-21 DIAGNOSIS — M1710 Unilateral primary osteoarthritis, unspecified knee: Principal | ICD-10-CM | POA: Diagnosis present

## 2011-06-21 DIAGNOSIS — I7300 Raynaud's syndrome without gangrene: Secondary | ICD-10-CM | POA: Diagnosis present

## 2011-06-21 HISTORY — PX: TOTAL KNEE ARTHROPLASTY: SHX125

## 2011-06-21 LAB — TYPE AND SCREEN: Antibody Screen: NEGATIVE

## 2011-06-21 LAB — GLUCOSE, CAPILLARY
Glucose-Capillary: 104 mg/dL — ABNORMAL HIGH (ref 70–99)
Glucose-Capillary: 108 mg/dL — ABNORMAL HIGH (ref 70–99)
Glucose-Capillary: 121 mg/dL — ABNORMAL HIGH (ref 70–99)

## 2011-06-21 SURGERY — ARTHROPLASTY, KNEE, TOTAL
Anesthesia: Spinal | Site: Knee | Laterality: Right | Wound class: Clean

## 2011-06-21 MED ORDER — PREGABALIN 75 MG PO CAPS
150.0000 mg | ORAL_CAPSULE | Freq: Every day | ORAL | Status: DC
  Administered 2011-06-21 – 2011-06-23 (×3): 150 mg via ORAL
  Filled 2011-06-21 (×3): qty 2

## 2011-06-21 MED ORDER — METHOCARBAMOL 500 MG PO TABS
500.0000 mg | ORAL_TABLET | Freq: Four times a day (QID) | ORAL | Status: DC | PRN
  Administered 2011-06-21 – 2011-06-24 (×8): 500 mg via ORAL
  Filled 2011-06-21 (×9): qty 1

## 2011-06-21 MED ORDER — NALOXONE HCL 0.4 MG/ML IJ SOLN: 0.4000 mg | INTRAMUSCULAR | Status: DC | PRN

## 2011-06-21 MED ORDER — CEFAZOLIN SODIUM-DEXTROSE 2-3 GM-% IV SOLR
2.0000 g | INTRAVENOUS | Status: AC
  Administered 2011-06-21: 2 g via INTRAVENOUS
  Filled 2011-06-21: qty 50

## 2011-06-21 MED ORDER — CEFAZOLIN SODIUM 1-5 GM-% IV SOLN
1.0000 g | Freq: Four times a day (QID) | INTRAVENOUS | Status: AC
  Administered 2011-06-21 – 2011-06-22 (×3): 1 g via INTRAVENOUS
  Filled 2011-06-21 (×6): qty 50

## 2011-06-21 MED ORDER — PROPOFOL 10 MG/ML IV EMUL
INTRAVENOUS | Status: DC | PRN
  Administered 2011-06-21: 100 ug/kg/min via INTRAVENOUS

## 2011-06-21 MED ORDER — PROMETHAZINE HCL 25 MG/ML IJ SOLN: 6.2500 mg | INTRAMUSCULAR | Status: DC | PRN

## 2011-06-21 MED ORDER — DIPHENHYDRAMINE HCL 50 MG/ML IJ SOLN: 12.5000 mg | Freq: Four times a day (QID) | INTRAMUSCULAR | Status: DC | PRN

## 2011-06-21 MED ORDER — HYOSCYAMINE SULFATE ER 0.375 MG PO TB12
0.3750 mg | ORAL_TABLET | Freq: Two times a day (BID) | ORAL | Status: DC
  Administered 2011-06-21 – 2011-06-24 (×6): 0.375 mg via ORAL
  Filled 2011-06-21 (×8): qty 1

## 2011-06-21 MED ORDER — TEMAZEPAM 15 MG PO CAPS: 15.0000 mg | ORAL_CAPSULE | Freq: Every evening | ORAL | Status: DC | PRN

## 2011-06-21 MED ORDER — MAGNESIUM HYDROXIDE 400 MG/5ML PO SUSP: 30.0000 mL | Freq: Two times a day (BID) | ORAL | Status: DC | PRN

## 2011-06-21 MED ORDER — SODIUM CHLORIDE 0.9 % IR SOLN
Status: DC | PRN
  Administered 2011-06-21: 1000 mL

## 2011-06-21 MED ORDER — METHOCARBAMOL 100 MG/ML IJ SOLN
500.0000 mg | Freq: Four times a day (QID) | INTRAVENOUS | Status: DC | PRN
  Filled 2011-06-21: qty 5

## 2011-06-21 MED ORDER — HYDROMORPHONE 0.3 MG/ML IV SOLN
INTRAVENOUS | Status: DC
  Administered 2011-06-21 (×2): 7.5 mg via INTRAVENOUS
  Administered 2011-06-21: 2.29 mg via INTRAVENOUS
  Administered 2011-06-21: 1.19 mg via INTRAVENOUS
  Administered 2011-06-22: 1.6 mg via INTRAVENOUS
  Filled 2011-06-21 (×2): qty 25

## 2011-06-21 MED ORDER — DOCUSATE SODIUM 100 MG PO CAPS
100.0000 mg | ORAL_CAPSULE | Freq: Two times a day (BID) | ORAL | Status: DC
  Administered 2011-06-21 – 2011-06-24 (×6): 100 mg via ORAL
  Filled 2011-06-21 (×8): qty 1

## 2011-06-21 MED ORDER — RIVAROXABAN 10 MG PO TABS
10.0000 mg | ORAL_TABLET | ORAL | Status: DC
  Administered 2011-06-22 – 2011-06-24 (×3): 10 mg via ORAL
  Filled 2011-06-21 (×3): qty 1

## 2011-06-21 MED ORDER — ONDANSETRON HCL 4 MG PO TABS: 4.0000 mg | ORAL_TABLET | Freq: Four times a day (QID) | ORAL | Status: DC | PRN

## 2011-06-21 MED ORDER — FENTANYL CITRATE 0.05 MG/ML IJ SOLN
INTRAMUSCULAR | Status: DC | PRN
  Administered 2011-06-21: 50 ug via INTRAVENOUS

## 2011-06-21 MED ORDER — METHOCARBAMOL 100 MG/ML IJ SOLN
500.0000 mg | INTRAMUSCULAR | Status: AC
  Administered 2011-06-21: 500 mg via INTRAVENOUS
  Filled 2011-06-21: qty 5

## 2011-06-21 MED ORDER — DIPHENHYDRAMINE HCL 12.5 MG/5ML PO ELIX: 12.5000 mg | ORAL_SOLUTION | Freq: Four times a day (QID) | ORAL | Status: DC | PRN

## 2011-06-21 MED ORDER — SODIUM CHLORIDE 0.9 % IJ SOLN: 9.0000 mL | INTRAMUSCULAR | Status: DC | PRN

## 2011-06-21 MED ORDER — LACTATED RINGERS IV SOLN
INTRAVENOUS | Status: DC | PRN
  Administered 2011-06-21 (×2): via INTRAVENOUS

## 2011-06-21 MED ORDER — BISACODYL 5 MG PO TBEC: 10.0000 mg | DELAYED_RELEASE_TABLET | Freq: Every day | ORAL | Status: DC | PRN

## 2011-06-21 MED ORDER — PANTOPRAZOLE SODIUM 40 MG PO TBEC
80.0000 mg | DELAYED_RELEASE_TABLET | Freq: Every day | ORAL | Status: DC
  Administered 2011-06-21 – 2011-06-23 (×3): 80 mg via ORAL
  Filled 2011-06-21 (×5): qty 2

## 2011-06-21 MED ORDER — ACETAMINOPHEN 650 MG RE SUPP: 650.0000 mg | Freq: Four times a day (QID) | RECTAL | Status: DC | PRN

## 2011-06-21 MED ORDER — PREGABALIN 75 MG PO CAPS
75.0000 mg | ORAL_CAPSULE | Freq: Two times a day (BID) | ORAL | Status: DC
  Administered 2011-06-21 – 2011-06-24 (×6): 75 mg via ORAL
  Filled 2011-06-21 (×6): qty 1

## 2011-06-21 MED ORDER — PROMETHAZINE HCL 25 MG PO TABS: 25.0000 mg | ORAL_TABLET | Freq: Four times a day (QID) | ORAL | Status: DC | PRN

## 2011-06-21 MED ORDER — METOCLOPRAMIDE HCL 5 MG/ML IJ SOLN: 5.0000 mg | Freq: Three times a day (TID) | INTRAMUSCULAR | Status: DC | PRN

## 2011-06-21 MED ORDER — MIDAZOLAM HCL 5 MG/5ML IJ SOLN
INTRAMUSCULAR | Status: DC | PRN
  Administered 2011-06-21: 2 mg via INTRAVENOUS

## 2011-06-21 MED ORDER — POLYETHYLENE GLYCOL 3350 17 G PO PACK
17.0000 g | PACK | Freq: Every day | ORAL | Status: DC | PRN
  Filled 2011-06-21: qty 1

## 2011-06-21 MED ORDER — METHYLPREDNISOLONE ACETATE PF 80 MG/ML IJ SUSP
INTRAMUSCULAR | Status: DC | PRN
  Administered 2011-06-21: 80 mg

## 2011-06-21 MED ORDER — ACETAMINOPHEN 10 MG/ML IV SOLN
1000.0000 mg | Freq: Four times a day (QID) | INTRAVENOUS | Status: AC
  Administered 2011-06-21 – 2011-06-22 (×4): 1000 mg via INTRAVENOUS
  Filled 2011-06-21 (×5): qty 100

## 2011-06-21 MED ORDER — PANTOPRAZOLE SODIUM 40 MG PO TBEC: 40.0000 mg | DELAYED_RELEASE_TABLET | Freq: Every day | ORAL | Status: DC

## 2011-06-21 MED ORDER — OXYCODONE HCL 5 MG PO TABS
5.0000 mg | ORAL_TABLET | ORAL | Status: DC | PRN
  Administered 2011-06-22 – 2011-06-24 (×11): 10 mg via ORAL
  Filled 2011-06-21 (×12): qty 2

## 2011-06-21 MED ORDER — BISACODYL 10 MG RE SUPP: 10.0000 mg | Freq: Every day | RECTAL | Status: DC | PRN

## 2011-06-21 MED ORDER — KCL IN DEXTROSE-NACL 20-5-0.45 MEQ/L-%-% IV SOLN
INTRAVENOUS | Status: DC
  Administered 2011-06-21 – 2011-06-23 (×3): via INTRAVENOUS
  Filled 2011-06-21 (×9): qty 1000

## 2011-06-21 MED ORDER — ONDANSETRON HCL 4 MG/2ML IJ SOLN: 4.0000 mg | Freq: Four times a day (QID) | INTRAMUSCULAR | Status: DC | PRN

## 2011-06-21 MED ORDER — MENTHOL 3 MG MT LOZG
1.0000 | LOZENGE | OROMUCOSAL | Status: DC | PRN
  Filled 2011-06-21: qty 9

## 2011-06-21 MED ORDER — PHENOL 1.4 % MT LIQD
1.0000 | OROMUCOSAL | Status: DC | PRN
  Filled 2011-06-21: qty 177

## 2011-06-21 MED ORDER — ACETAMINOPHEN 325 MG PO TABS
650.0000 mg | ORAL_TABLET | Freq: Four times a day (QID) | ORAL | Status: DC | PRN
  Administered 2011-06-22 – 2011-06-24 (×2): 650 mg via ORAL
  Filled 2011-06-21 (×2): qty 2

## 2011-06-21 MED ORDER — DIPHENHYDRAMINE HCL 12.5 MG/5ML PO ELIX: 12.5000 mg | ORAL_SOLUTION | ORAL | Status: DC | PRN

## 2011-06-21 MED ORDER — FLEET ENEMA 7-19 GM/118ML RE ENEM: 1.0000 | ENEMA | Freq: Every day | RECTAL | Status: DC | PRN

## 2011-06-21 MED ORDER — BUPIVACAINE HCL 0.75 % IJ SOLN
INTRAMUSCULAR | Status: DC | PRN
  Administered 2011-06-21: 1.7 mL

## 2011-06-21 MED ORDER — CEFAZOLIN SODIUM 1-5 GM-% IV SOLN: 1.0000 g | Freq: Four times a day (QID) | INTRAVENOUS | Status: DC

## 2011-06-21 MED ORDER — EPHEDRINE SULFATE 50 MG/ML IJ SOLN
INTRAMUSCULAR | Status: DC | PRN
  Administered 2011-06-21: 10 mg via INTRAVENOUS

## 2011-06-21 MED ORDER — CYCLOSPORINE 0.05 % OP EMUL
2.0000 [drp] | Freq: Two times a day (BID) | OPHTHALMIC | Status: DC
  Administered 2011-06-21 – 2011-06-24 (×6): 2 [drp] via OPHTHALMIC
  Filled 2011-06-21 (×10): qty 1

## 2011-06-21 MED ORDER — METOCLOPRAMIDE HCL 10 MG PO TABS: 5.0000 mg | ORAL_TABLET | Freq: Three times a day (TID) | ORAL | Status: DC | PRN

## 2011-06-21 MED ORDER — BUPIVACAINE ON-Q PAIN PUMP (FOR ORDER SET NO CHG)
INJECTION | Status: DC
  Filled 2011-06-21: qty 1

## 2011-06-21 MED ORDER — HYDROMORPHONE HCL PF 1 MG/ML IJ SOLN
0.2500 mg | INTRAMUSCULAR | Status: DC | PRN
  Administered 2011-06-21 (×2): 0.5 mg via INTRAVENOUS

## 2011-06-21 MED ORDER — ACETAMINOPHEN 10 MG/ML IV SOLN
INTRAVENOUS | Status: DC | PRN
  Administered 2011-06-21: 1000 mg via INTRAVENOUS

## 2011-06-21 SURGICAL SUPPLY — 60 items
BAG SPEC THK2 15X12 ZIP CLS (MISCELLANEOUS) ×1
BAG ZIPLOCK 12X15 (MISCELLANEOUS) ×2 IMPLANT
BANDAGE ELASTIC 6 VELCRO ST LF (GAUZE/BANDAGES/DRESSINGS) ×2 IMPLANT
BANDAGE ESMARK 6X9 LF (GAUZE/BANDAGES/DRESSINGS) ×1 IMPLANT
BLADE SAG 18X100X1.27 (BLADE) ×2 IMPLANT
BLADE SAW SGTL 11.0X1.19X90.0M (BLADE) ×2 IMPLANT
BNDG CMPR 9X6 STRL LF SNTH (GAUZE/BANDAGES/DRESSINGS) ×1
BNDG ESMARK 6X9 LF (GAUZE/BANDAGES/DRESSINGS) ×2
BOWL SMART MIX CTS (DISPOSABLE) ×2 IMPLANT
CATH KIT ON-Q SILVERSOAK 5 (CATHETERS) ×1 IMPLANT
CATH KIT ON-Q SILVERSOAK 5IN (CATHETERS) ×2 IMPLANT
CEMENT HV SMART SET (Cement) ×4 IMPLANT
CEMENT TIBIA MBT SIZE 2.5 (Knees) IMPLANT
CLOTH BEACON ORANGE TIMEOUT ST (SAFETY) ×2 IMPLANT
CUFF TOURN SGL QUICK 34 (TOURNIQUET CUFF) ×2
CUFF TRNQT CYL 34X4X40X1 (TOURNIQUET CUFF) ×1 IMPLANT
DRAPE EXTREMITY T 121X128X90 (DRAPE) ×2 IMPLANT
DRAPE POUCH INSTRU U-SHP 10X18 (DRAPES) ×2 IMPLANT
DRAPE U-SHAPE 47X51 STRL (DRAPES) ×2 IMPLANT
DRSG ADAPTIC 3X8 NADH LF (GAUZE/BANDAGES/DRESSINGS) ×2 IMPLANT
DRSG TEGADERM 4X4.75 (GAUZE/BANDAGES/DRESSINGS) ×1 IMPLANT
DURAPREP 26ML APPLICATOR (WOUND CARE) ×2 IMPLANT
ELECT REM PT RETURN 9FT ADLT (ELECTROSURGICAL) ×2
ELECTRODE REM PT RTRN 9FT ADLT (ELECTROSURGICAL) ×1 IMPLANT
EVACUATOR 1/8 PVC DRAIN (DRAIN) ×2 IMPLANT
FACESHIELD LNG OPTICON STERILE (SAFETY) ×10 IMPLANT
FEMUR SIGMA PS SZ 3.0 R (Femur) ×1 IMPLANT
GLOVE BIO SURGEON STRL SZ7.5 (GLOVE) ×2 IMPLANT
GLOVE BIO SURGEON STRL SZ8 (GLOVE) ×2 IMPLANT
GLOVE BIOGEL PI IND STRL 8 (GLOVE) ×2 IMPLANT
GLOVE BIOGEL PI INDICATOR 8 (GLOVE) ×2
GOWN PREVENTION PLUS XLARGE (GOWN DISPOSABLE) ×2 IMPLANT
GOWN STRL REIN XL XLG (GOWN DISPOSABLE) ×2 IMPLANT
HANDPIECE INTERPULSE COAX TIP (DISPOSABLE) ×2
IMMOBILIZER KNEE 20 (SOFTGOODS) ×2
IMMOBILIZER KNEE 20 THIGH 36 (SOFTGOODS) ×1 IMPLANT
IMMOBILIZER KNEE 22 UNIV (SOFTGOODS) ×1 IMPLANT
KIT BASIN OR (CUSTOM PROCEDURE TRAY) ×2 IMPLANT
MANIFOLD NEPTUNE II (INSTRUMENTS) ×2 IMPLANT
NS IRRIG 1000ML POUR BTL (IV SOLUTION) ×2 IMPLANT
PACK TOTAL JOINT (CUSTOM PROCEDURE TRAY) ×2 IMPLANT
PAD ABD 7.5X8 STRL (GAUZE/BANDAGES/DRESSINGS) ×2 IMPLANT
PADDING CAST COTTON 6X4 STRL (CAST SUPPLIES) ×6 IMPLANT
PADDING WEBRIL 6 STERILE (GAUZE/BANDAGES/DRESSINGS) ×1 IMPLANT
PATELLA DOME PFC 35MM (Knees) ×2 IMPLANT
PLATE ROT INSERT 12.5MM SIZE 3 (Plate) ×2 IMPLANT
POSITIONER SURGICAL ARM (MISCELLANEOUS) ×2 IMPLANT
SET HNDPC FAN SPRY TIP SCT (DISPOSABLE) ×1 IMPLANT
SPONGE GAUZE 4X4 12PLY (GAUZE/BANDAGES/DRESSINGS) ×2 IMPLANT
STRIP CLOSURE SKIN 1/2X4 (GAUZE/BANDAGES/DRESSINGS) ×4 IMPLANT
SUCTION FRAZIER 12FR DISP (SUCTIONS) ×2 IMPLANT
SUT MNCRL AB 4-0 PS2 18 (SUTURE) ×2 IMPLANT
SUT PDS AB 1 CT1 27 (SUTURE) ×6 IMPLANT
SUT VIC AB 2-0 CT1 27 (SUTURE) ×6
SUT VIC AB 2-0 CT1 TAPERPNT 27 (SUTURE) ×3 IMPLANT
TIBIA MBT CEMENT SIZE 2.5 (Knees) ×2 IMPLANT
TOWEL OR 17X26 10 PK STRL BLUE (TOWEL DISPOSABLE) ×4 IMPLANT
TRAY FOLEY CATH 14FRSI W/METER (CATHETERS) ×2 IMPLANT
WATER STERILE IRR 1500ML POUR (IV SOLUTION) ×2 IMPLANT
WRAP KNEE MAXI GEL POST OP (GAUZE/BANDAGES/DRESSINGS) ×2 IMPLANT

## 2011-06-21 NOTE — Op Note (Addendum)
Pre-operative diagnosis- Osteoarthritis Right knee(s)  Post-operative diagnosis- Osteoarthritis Right knee(s)  Surgeon- Dione Plover. Orbin Mayeux, MD  Assistant- Arlee Muslim, PA-C  Anesthesia- Spinal  EBL minimal  Drains Hemovac  Tourniquet time- 31 minutes at [id number]  Complications- None  Condition Stable to PACU  Brief Clinical Note Brief Clinical Note [name] is a 60 y.o. year old female with end stage OA of his left knee with progressively worsening pain and dysfunction. She has constant pain, with activity and at rest and significant functional deficits with difficulties even with ADLs. Se has had extensive non-op management including analgesics, injections of cortisone and viscosupplements, and home exercise program, but remains in significant pain with significant dysfunction. SHe has bone on bone arthritis in the medial and patellofemoral compartments and tibial subluxation on the femur. She presents now for left Total Knee Arthroplasty.  Procedure in detail---  The patient is brought into the operating room and positioned supine on the operating table. After successful administration of Spinal, a tourniquet is placed high on the Right thigh(s) and the lower extremity is prepped and draped in the usual sterile fashion. Time out is performed by the operating team and then the Right lower extremity is wrapped in Esmarch, knee flexed and the tourniquet inflated to 300 mmHg.  A midline incision is made with a ten blade through the subcutaneous tissue to the level of the extensor mechanism. A fresh blade is used to make a medial parapatellar arthrotomy. Soft tissue over the proximal medial tibia is subperiosteally elevated to the joint line with a knife and into the semimembranosus bursa with a Cobb elevator. Soft tissue over the proximal lateral tibia is elevated with attention being paid to avoiding the patellar tendon on the tibial tubercle. The patella is everted, knee flexed 90 degrees and the  ACL and PCL are removed. Findings are bone on bone arthritis of the medial and patellofemoral compartments with marginal osteophytes off the tibia.  The drill is used to create a starting hole in the distal femur and the canal is thoroughly irrigated with sterile saline to remove the fatty contents. The 5 degree Right valgus alignment guide is placed into the femoral canal and the distal femoral cutting block is pinned to remove 11 mm off the distal femur. Resection is made with an oscillating saw.  The tibia is subluxed forward and the menisci are removed. The extramedullary alignment guide is placed referencing proximally at the medial aspect of the tibial tubercle and distally along the second metatarsal axis and tibial crest. The block is pinned to remove 60mm off the more deficient medial side. Resection is made with an oscillating saw. Size 2.5is the most appropriate size for the tibia and the proximal tibia is prepared with the modular drill and keel punch for that size.  The femoral sizing guide is placed and size 3 is most appropriate. Rotation is marked off the epicondylar axis and confirmed by creating a rectangular flexion gap at 90 degrees. The size 3 cutting block is pinned in this rotation and the anterior, posterior and chamfer cuts are made with the oscillating saw. The intercondylar block is then placed and that cut is made.  Trial size 2.5 tibial component, trial size 3 posterior stabilized femur and a 12.5 mm posterior stabilized rotating platform insert trial is placed. Full extension is achieved with excellent varus/valgus and anterior/posterior balance throughout full range of motion. The patella is everted and thickness measured to be 22 mm. Free hand resection is taken  to 12 mm, a 35 template is placed, lug holes are drilled, trial patella is placed, and it tracks normally. Osteophytes are removed off the posterior femur with the trial in place. All trials are removed and the cut bone  surfaces prepared with pulsatile lavage. Cement is mixed and once ready for implantation, the size 2.5 tibial implant, size 3 posterior stabilized femoral component, and the size 35 patella are cemented in place and the patella is held with the clamp. The trial insert is placed and the knee held in full extension. All extruded cement is removed and once the cement is hard the permanent 12.5 mm posterior stabilized rotating platform insert is placed into the tibial tray.  The wound is copiously irrigated with saline solution and the extensor mechanism closed over a hemovac drain with #1 PDS suture. The tourniquet is released for a total tourniquet time of 31 minutes. Flexion against gravity is 140 degrees and the patella tracks normally. Subcutaneous tissue is closed with 2.0 vicryl and subcuticular with running 4.0 Monocryl. The catheter for the Marcaine pain pump is placed and the pump is initiated. The incision is cleaned and dried and steri-strips and a bulky sterile dressing are applied. The limb is placed into a knee immobilizer and the patient is awakened and transported to recovery in stable condition.  Please note that a surgical assistant was a medical necessity for this procedure in order to perform it in a safe and expeditious manner. Surgical assistant was necessary to retract the ligaments and vital neurovascular structures to prevent injury to them and also necessary for proper positioning of the limb to allow for anatomic placement of the prosthesis. 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 At the conclusion of our procedure, after a sterile prep the right trochanteric bursa is injected with 80 mg (85ml) of Depomedrol without difficulty.  Dione Plover Marisa Huyett, MD    06/21/2011, 7:00 PM

## 2011-06-21 NOTE — Anesthesia Procedure Notes (Addendum)
Spinal  Start time: 06/21/2011 8:41 AM Staffing Anesthesiologist: Salley Scarlet CRNA/Resident: [place] Jaycee Mckellips C Performed by: resident/CRNA  Spinal Block Patient position: sitting Prep: Betadine and site prepped and draped Patient monitoring: heart rate, continuous pulse ox and blood pressure Approach: midline Location: L3-4 Injection technique: single-shot Needle Needle type: Sprotte  Needle gauge: 24 G Needle length: 10 cm Assessment Sensory level: T4 Additional Notes No Parasthesia, Clear CSF before and after injection of ).75% Bupivicaine with Epi wash Tray Lot # [id number] Exp. [id number]

## 2011-06-21 NOTE — Brief Op Note (Signed)
06/21/2011  9:34 AM  PATIENT:  [name]  60 y.o. female  PRE-OPERATIVE DIAGNOSIS:  osteoarthris right knee   POST-OPERATIVE DIAGNOSIS:  osteoarthris right knee   PROCEDURE:  Procedure(s): TOTAL KNEE ARTHROPLASTY  SURGEON:  Surgeon(s): Dione Plover Reade Trefz  PHYSICIAN ASSISTANT:   ASSISTANTS: Arlee Muslim, PA-C   ANESTHESIA:   spinal  EBL:  Total I/O In: 1000 [I.V.:1000] Out: 175 [Urine:100; Blood:75]  BLOOD ADMINISTERED:none  DRAINS: (Medium) Hemovact drain(s) in the left knee with  Suction Open   LOCAL MEDICATIONS USED:  NONE  SPECIMEN:  No Specimen  DISPOSITION OF SPECIMEN:  N/A  COUNTS:  YES  TOURNIQUET:  * Missing tourniquet times found for documented tourniquets in log:  5174 *  DICTATION: .Note written in EPIC  PLAN OF CARE: Admit to inpatient   PATIENT DISPOSITION:  PACU - hemodynamically stable.   Delay start of Pharmacological VTE agent (>24hrs) due to surgical blood loss or risk of bleeding:  yes              Pre-operative diagnosis- Osteoarthritis  Right knee(s)  Post-operative diagnosis- Osteoarthritis Right knee(s)  Surgeon- Dione Plover. Dayanira Giovannetti, MD  Assistant- Arlee Muslim, PA-C   Anesthesia-  Spinal EBL minimal  Drains Hemovac  Tourniquet time- 31  minutes at [id number]   Complications- None  Condition Stable to PACU  Brief Clinical Note Brief Clinical Note  [name] is a 60 y.o. year old female with end stage OA of his left knee with progressively worsening pain and dysfunction. She has constant pain, with activity and at rest and significant functional deficits with difficulties even with ADLs. Se has had extensive non-op management including analgesics, injections of cortisone and viscosupplements, and home exercise program, but remains in significant pain with significant dysfunction. SHe has bone on bone arthritis in the medial and patellofemoral compartments and tibial subluxation on the femur. She presents now for left  Total Knee Arthroplasty.     Procedure in detail---   The patient is brought into the operating room and positioned supine on the operating table. After successful administration of  Spinal,   a tourniquet is placed high on the  Right thigh(s) and the lower extremity is prepped and draped in the usual sterile fashion. Time out is performed by the operating team and then the  Right lower extremity is wrapped in Esmarch, knee flexed and the tourniquet inflated to 300 mmHg.       A midline incision is made with a ten blade through the subcutaneous tissue to the level of the extensor mechanism. A fresh blade is used to make a medial parapatellar arthrotomy. Soft tissue over the proximal medial tibia is subperiosteally elevated to the joint line with a knife and into the semimembranosus bursa with a Cobb elevator. Soft tissue over the proximal lateral tibia is elevated with attention being paid to avoiding the patellar tendon on the tibial tubercle. The patella is everted, knee flexed 90 degrees and the ACL and PCL are removed. Findings are bone on bone arthritis of the medial and patellofemoral compartments with marginal osteophytes off the tibia.        The drill is used to create a starting hole in the distal femur and the canal is thoroughly irrigated with sterile saline to remove the fatty contents. The 5 degree Right  valgus alignment guide is placed into the femoral canal and the distal femoral cutting block is pinned to remove 11 mm off the distal femur. Resection is made  with an oscillating saw.      The tibia is subluxed forward and the menisci are removed. The extramedullary alignment guide is placed referencing proximally at the medial aspect of the tibial tubercle and distally along the second metatarsal axis and tibial crest. The block is pinned to remove 48mm off the more deficient medial  side. Resection is made with an oscillating saw. Size 2.5is the most appropriate size for the tibia and the  proximal tibia is prepared with the modular drill and keel punch for that size.      The femoral sizing guide is placed and size 3 is most appropriate. Rotation is marked off the epicondylar axis and confirmed by creating a rectangular flexion gap at 90 degrees. The size 3 cutting block is pinned in this rotation and the anterior, posterior and chamfer cuts are made with the oscillating saw. The intercondylar block is then placed and that cut is made.      Trial size 2.5 tibial component, trial size 3 posterior stabilized femur and a 12.5  mm posterior stabilized rotating platform insert trial is placed. Full extension is achieved with excellent varus/valgus and anterior/posterior balance throughout full range of motion. The patella is everted and thickness measured to be 22  mm. Free hand resection is taken to 12 mm, a 35 template is placed, lug holes are drilled, trial patella is placed, and it tracks normally. Osteophytes are removed off the posterior femur with the trial in place. All trials are removed and the cut bone surfaces prepared with pulsatile lavage. Cement is mixed and once ready for implantation, the size 2.5 tibial implant, size 3 posterior stabilized femoral component, and the size 35 patella are cemented in place and the patella is held with the clamp. The trial insert is placed and the knee held in full extension. All extruded cement is removed and once the cement is hard the permanent 12.5 mm posterior stabilized rotating platform insert is placed into the tibial tray.      The wound is copiously irrigated with saline solution and the extensor mechanism closed over a hemovac drain with #1 PDS suture. The tourniquet is released for a total tourniquet time of 31  minutes. Flexion against gravity is 140 degrees and the patella tracks normally. Subcutaneous tissue is closed with 2.0 vicryl and subcuticular with running 4.0 Monocryl. The catheter for the Marcaine pain pump is placed and the pump  is initiated. The incision is cleaned and dried and steri-strips and a bulky sterile dressing are applied. The limb is placed into a knee immobilizer and the patient is awakened and transported to recovery in stable condition.      Please note that a surgical assistant was a medical necessity for this procedure in order to perform it in a safe and expeditious manner. Surgical assistant was necessary to retract the ligaments and vital neurovascular structures to prevent injury to them and also necessary for proper positioning of the limb to allow for anatomic placement of the prosthesis.  Dione Plover Kendrik Mcshan, MD    06/21/2011, 9:43 AM

## 2011-06-21 NOTE — Preoperative (Signed)
Beta Blockers   Reason not to administer Beta Blockers:Not Applicable 

## 2011-06-21 NOTE — Transfer of Care (Signed)
Immediate Anesthesia Transfer of Care Note  Patient: [name]  Procedure(s) Performed:  TOTAL KNEE ARTHROPLASTY  Patient Location: PACU  Anesthesia Type: MAC and Spinal  Level of Consciousness: awake and oriented  Airway & Oxygen Therapy: Patient Spontanous Breathing and Patient connected to face mask oxygen  Post-op Assessment: Report given to PACU RN and Post -op Vital signs reviewed and stable  Post vital signs: Reviewed and stable  Complications: No apparent anesthesia complications

## 2011-06-21 NOTE — Anesthesia Postprocedure Evaluation (Signed)
  Anesthesia Post-op Note  Patient: [name]  Procedure(s) Performed:  TOTAL KNEE ARTHROPLASTY  Patient Location: PACU  Anesthesia Type: Spinal  Level of Consciousness: awake and alert   Airway and Oxygen Therapy: Patient Spontanous Breathing  Post-op Pain: mild  Post-op Assessment: Post-op Vital signs reviewed, Patient's Cardiovascular Status Stable, Respiratory Function Stable, Patent Airway and No signs of Nausea or vomiting  Post-op Vital Signs: stable  Complications: No apparent anesthesia complications; moving both feet

## 2011-06-21 NOTE — H&P (Signed)
[name] DOB: Dec 04, 1950  Date of Admission: 06/21/2011   History of Present Illness The patient is a 60 year old female who comes in today for a preoperative History and Physical. The patient is scheduled for a right total knee arthroplasty to be performed by Dr. Dione Plover. Waylan Busta, MD at [facility] on 06/21/2011 .  Allergies IV contrast DEMEROL. IV ADHESIVE. (CAN USE PAPER TAPE AND STERI-STRIPS) Morphine Derivatives. NO ALLERGY but intolerance - not very effective  Medications Lyrica Phenergan Plaquenil Nexium Vicodin Folic Acid Hyscomine Lidocaine Vit. D Enbrel  Family History Cancer. father and grandmother fathers side Cerebrovascular Accident. grandfather mothers side mother and grandfather mothers side Chronic Obstructive Lung Disease. father Congestive Heart Failure. grandmother fathers side and grandfather fathers side sister and grandfather fathers side Depression. mother Heart Disease. mother, brother, grandmother fathers side and grandfather fathers side mother, sister, grandmother fathers side and grandfather fathers side Heart disease in female family member before age 9 Hypertension. mother, brother, grandfather mothers side, grandmother fathers side and grandfather fathers side mother, brother, grandmother mothers side, grandmother fathers side, grandfather fathers side and child Osteoarthritis. mother, brother, grandmother mothers side and grandmother fathers side Rheumatoid Arthritis. grandmother fathers side Severe allergy. child   Social History Alcohol use. current drinker; drinks beer, wine and hard liquor; only occasionally per week current drinker; drinks wine and hard liquor; only occasionally per week Children. 2 Current work status. disabled Drug/Alcohol Rehab (Currently). no Copy of Drug/Alcohol Rehab (Previously). no Exercise. Exercises rarely; does running / walking and other Exercises  weekly; does running / walking Illicit drug use. no Living situation. live with spouse Marital status. married Number of flights of stairs before winded. greater than 5 2-3 Pain Contract. yes Tobacco / smoke exposure. no yes outdoors only Tobacco use. never smoker   Past Surgical History Ankle Surgery. Date: 63. left, tendon repair Appendectomy Arthroscopy of Knee. right, 1998 and 2001 Breast Biopsy. left Colon Polyp Removal - Colonoscopy Dilation and Curettage of Uterus Gallbladder Surgery. laporoscopic Hysterectomy. Date: 38. partial (non-cancerous) Tonsillectomy. Date: 12. Tubal Ligation Paotid Gland Tumor Removal (Noncancerous) . Date: 69. Ovary Removal - Both Bladder Tack Procedure. Date: 53. Wisdom Teeth Extraction. Date: 36.  Medical History Cardiac Arrhythmia . Frequent PVC's Chronic Pain Diabetes Mellitus, Type II. Diet-Controlled Fibromyalgia Gastroesophageal Reflux Disease Hypercholesterolemia Irritable bowel syndrome Migraine Headache Osteoarthritis Osteoporosis Peripheral Neuropathy Rheumatoid Arthritis Skin Cancer. Basal Cell Sleep Apnea. CPAP Barrett's Esophagus Hiatal Hernia Past History of Gastritis Urinary Incontinence. 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 Skin:Not Present- Itching, Rash, Skin Color Changes, Ulcer, Psoriasis and Change in Hair or Nails. HEENT:Not Present- Sensitivity to light, Hearing problems, Nose Bleed and Ringing in the Ears. Neck:Present- Swollen Glands. Not Present- Neck Mass. Respiratory:Not Present- Snoring, Chronic Cough, Bloody sputum and Dyspnea. Cardiovascular:Present- Leg Cramps and Palpitations. Not Present- Shortness of Breath, Chest Pain and Swelling of  Extremities. Gastrointestinal:Not Present- Bloody Stool, Heartburn, Abdominal Pain, Vomiting, Nausea and Incontinence of Stool. Female Genitourinary:Not Present- Blood in Urine, Menstrual Irregularities, Frequency, Incontinence and Nocturia. Musculoskeletal:Present- Joint Stiffness and Joint Pain. Not Present- Muscle Weakness, Muscle Pain, Joint Swelling and Back Pain. Neurological:Present- Tingling and Numbness. Not Present- Burning, Tremor, Headaches and Dizziness. Psychiatric:Not Present- Anxiety, Depression and Memory Loss. Endocrine:Present- Cold Intolerance. Not Present- Heat Intolerance, Excessive hunger and Excessive Thirst. Hematology:Present- Easy Bruising. Not  Present- Abnormal Bleeding, Anemia and Blood Clots.   Vitals Weight: 239 lb Height: 61 in Weight was reported by patient. Height was reported by patient. Body Surface Area: 2.16 m Body Mass Index: 45.16 kg/m Pulse: 68 (Regular) Resp.: 12 (Unlabored) BP: 118/72 (Sitting, Right Arm, Standard)    Physical Exam The physical exam findings are as follows:GENERAL: Patient is a 60 y.o. female, well-nourished, well-developed, no acute distress. Alert, oriented, cooperative. HENT:  Normocephalic, atraumatic. Pupils round and reactive. EOMs intact. NECK:  Supple, no bruits. CHEST:  Clear to anterior and posterior chest walls. No rhonchi, rales, wheezes.  Scoliosis noted. HEART:  Regular, rate and rhythm with occasional ectopic beat.  No murmurs.  S1 and S2 noted. ABDOMEN:  Soft, nontender, bowel sounds present. RECTAL/BREAST/GENITALIA:  Not done, not pertinent to present illness. EXTREMITIES:  Lower Extremity Knee/Patella:Examination of right knee reveals - no swelling or edema. Inspection and Palpation:Crepitus- coarse, (R). Examination reveals - no ecchymosis, (R). Assessment of pain reveals the following findings:: Location:Right- over the lateral aspect and over the medial  aspect. ROJM: Right: Flexion:AROM- 95 . Extension:AROM- 10 .   Assessment & Plan Osteoarthritis, knee (715.96) Impression: Right Knee  Patient to undergo a Right Total Knee Replacement. Risks and benefits of the surgery have been discussed with the patient and they elect to proceed with surgery.  There are on active contraindications to upcoming procedure such as ongoing infection or progressive neurological disease. Patient wants to look into skilled rehab, possible [facility].  Pt examined. History and physical unchanged Dione Plover. Jamari Diana, MD    06/21/2011, 8:21 AM

## 2011-06-21 NOTE — Progress Notes (Signed)
Orthopedic Tech Progress Note Patient Details:  [name] 1950/09/21 [id number] Applied CPM at 12:40p CPM Right Knee CPM Right Knee: On Right Knee Flexion (Degrees): 40  Right Knee Extension (Degrees): 5    [name] 06/21/2011, 12:44 PM

## 2011-06-21 NOTE — Anesthesia Preprocedure Evaluation (Addendum)
Anesthesia Evaluation  Patient identified by MRN, date of birth, ID band Patient awake    Reviewed: Allergy & Precautions, H&P , NPO status , Patient's Chart, lab work & pertinent test results, reviewed documented beta blocker date and time   Airway Mallampati: II TM Distance: >3 FB Neck ROM: full    Dental No notable dental hx. (+) Poor Dentition and Missing   Pulmonary neg pulmonary ROS, sleep apnea and Continuous Positive Airway Pressure Ventilation ,  clear to auscultation  Pulmonary exam normal       Cardiovascular Exercise Tolerance: Good neg cardio ROS - pacemakerregular Normal    Neuro/Psych Negative Neurological ROS  Negative Psych ROS   GI/Hepatic negative GI ROS, Neg liver ROS, hiatal hernia, GERD-  ,  Endo/Other  Negative Endocrine ROSDiabetes mellitus-, Well Controlled, Type 2  Renal/GU negative Renal ROS  Genitourinary negative   Musculoskeletal negative musculoskeletal ROS (+) Arthritis -, Rheumatoid disorders,  Fibromyalgia -  Abdominal (+) obese,   Peds negative pediatric ROS (+)  Hematology negative hematology ROS (+)   Anesthesia Other Findings   Reproductive/Obstetrics negative OB ROS                        Anesthesia Physical Anesthesia Plan  ASA: III  Anesthesia Plan: Spinal   Post-op Pain Management:    Induction:   Airway Management Planned: Simple Face Mask  Additional Equipment:   Intra-op Plan:   Post-operative Plan:   Informed Consent: I have reviewed the patients History and Physical, chart, labs and discussed the procedure including the risks, benefits and alternatives for the proposed anesthesia with the patient or authorized representative who has indicated his/her understanding and acceptance.   Dental Advisory Given  Plan Discussed with: CRNA  Anesthesia Plan Comments:        Anesthesia Quick Evaluation

## 2011-06-21 NOTE — Progress Notes (Signed)
Pt placed on CPAP 12cm H2O with 2lpm O2 bled in. Pt has nasal pillows from home. Pt states she is comfortable and no distress noted.

## 2011-06-22 DIAGNOSIS — M1712 Unilateral primary osteoarthritis, left knee: Secondary | ICD-10-CM

## 2011-06-22 LAB — BASIC METABOLIC PANEL
CO2: 28 mEq/L (ref 19–32)
Chloride: 102 mEq/L (ref 96–112)
Sodium: 137 mEq/L (ref 135–145)

## 2011-06-22 LAB — CBC
Platelets: 139 10*3/uL — ABNORMAL LOW (ref 150–400)
RBC: 4.09 MIL/uL (ref 3.87–5.11)
WBC: 6.3 10*3/uL (ref 4.0–10.5)

## 2011-06-22 LAB — GLUCOSE, CAPILLARY: Glucose-Capillary: 140 mg/dL — ABNORMAL HIGH (ref 70–99)

## 2011-06-22 MED ORDER — HYDROMORPHONE HCL PF 1 MG/ML IJ SOLN
1.0000 mg | INTRAMUSCULAR | Status: DC | PRN
  Administered 2011-06-22: 1 mg via INTRAVENOUS
  Filled 2011-06-22: qty 1

## 2011-06-22 NOTE — Progress Notes (Signed)
Physical Therapy Treatment Patient Details Name: [name] MRN: [id number] DOB: 1951-04-21 Today's Date: 06/22/2011 1336 - 31  GT X1 PT Assessment/Plan  PT - Assessment/Plan PT Plan: Discharge plan remains appropriate PT Frequency: 7X/week Follow Up Recommendations: Skilled nursing facility PT Goals  Acute Rehab PT Goals PT Goal: Supine/Side to Sit - Progress: Partly met PT Goal: Sit to Supine/Side - Progress: Partly met PT Transfer Goal: Sit to Stand/Stand to Sit - Progress: Partly met PT Goal: Ambulate - Progress: Partly met  PT Treatment Precautions/Restrictions  Precautions Precautions: Knee Required Braces or Orthoses: Yes Knee Immobilizer: On when out of bed or walking Restrictions Weight Bearing Restrictions: No Mobility (including Balance) Bed Mobility Bed Mobility: Yes Sit to Supine - Right: 3: Mod assist Transfers Transfers: Yes Sit to Stand: 3: Mod assist Stand to Sit: 4: Min assist;3: Mod assist Ambulation/Gait Ambulation/Gait: Yes Ambulation/Gait Assistance: 4: Min assist;3: Mod assist Ambulation/Gait Assistance Details (indicate cue type and reason): cues for posture and position from RW Ambulation Distance (Feet): 78 Feet Assistive device: Rolling walker Gait Pattern: Step-to pattern    Exercise    End of Session PT - End of Session Activity Tolerance: Patient tolerated treatment well Patient left: in bed General Behavior During Session: [facility] for tasks performed Cognition: [facility] for tasks performed  [name] 06/22/2011, 2:42 PM

## 2011-06-22 NOTE — Progress Notes (Signed)
Subjective: 1 Day Post-Op Procedure(s) (LRB): TOTAL KNEE ARTHROPLASTY (Right) Patient reports pain as moderate.  Rested fairly well last night  Objective: Vital signs in last 24 hours: Temp:  [96.7 F (35.9 C)-99.2 F (37.3 C)] 98.4 F (36.9 C) (11/08 0430) Pulse Rate:  [60-90] 81  (11/08 0430) Resp:  [8-20] 18  (11/08 0430) BP: (104-149)/(48-91) 115/74 mmHg (11/08 0430) SpO2:  [92 %-100 %] 94 % (11/08 0430) Weight:  [107.502 kg (237 lb)] 237 lb (107.502 kg) (11/07 1500)  Intake/Output from previous day: 11/07 0701 - 11/08 0700 In: 3745 [P.O.:240; I.V.:3300; IV Piggyback:205] Out: 3670 [Urine:3125; Drains:470; Blood:75] Intake/Output this shift:     Basename 06/22/11 0447  HGB 12.2    Basename 06/22/11 0447  WBC 6.3  RBC 4.09  HCT 36.9  PLT 139*    Basename 06/22/11 0447  NA 137  K 4.5  CL 102  CO2 28  BUN 7  CREATININE 0.64  GLUCOSE 109*  CALCIUM 8.8   No results found for this basename: LABPT:2,INR:2 in the last 72 hours  Neurovascular intact Dressing C/D/I   Assessment/Plan: 1 Day Post-Op Procedure(s) (LRB): TOTAL KNEE ARTHROPLASTY (Right) Advance diet Up with therapy Discharge to SNF Hemovac D/C'ed  Gearlean Alf 06/22/2011, 7:35 AM

## 2011-06-22 NOTE — Progress Notes (Signed)
Physical Therapy Evaluation Patient Details Name: [name] MRN: [id number] DOB: 01-28-1951 Today's Date: 06/22/2011 1032 - 1  Eval, TE Problem List: There is no problem list on file for this patient.   Past Medical History:  Past Medical History  Diagnosis Date  . Heart murmur     as a child  . Pneumonia     1980's  . Sleep apnea     States SEVERE- last study  8/12/CPAP- states setting on 12  . Diabetes mellitus     diet controlled  . GERD (gastroesophageal reflux disease)     Barretts esophagitis per pt  . Blood transfusion     1981  . Arthritis     Rheumatoid arthritis,   . Hiatal hernia     sjorgens syndrome  . Fibromyalgia   . Peripheral autonomic neuropathy of unknown cause   . No pertinent past medical history     [place] note Dr s.Stoneking, [place] on chart   . Dysrhythmia     /benign PVC's/ EKG 8/12 on  chart   Past Surgical History:  Past Surgical History  Procedure Date  . Tonsillectomy   . Knee arthroscopy     x2  . Diagnostic laparoscopy     x3  . Tendon repair 1980    left ankle and tibia  . Abdominal hysterectomy     BTL, BSO  . Cholecystectomy   . Tubal ligation   . Patotid cystectomy     PT Assessment/Plan/Recommendation PT Assessment Clinical Impression Statement: Pt with R TKE 06/21/11 presents with decreased strength, ROM and functional mobility and would benefit from skilled PT intervention to maximize I for next venue of care and eventual return home with spouse PT Recommendation/Assessment: Patient will need skilled PT in the acute care venue PT Problem List: Decreased strength;Decreased range of motion;Decreased activity tolerance;Decreased mobility;Decreased knowledge of use of DME;Decreased knowledge of precautions;Obesity PT Therapy Diagnosis : Difficulty walking PT Plan PT Frequency: 7X/week PT Treatment/Interventions: DME instruction;Gait training;Stair training;Functional mobility training;Therapeutic  exercise;Patient/family education PT Recommendation Recommendations for Other Services: OT consult Follow Up Recommendations: Skilled nursing facility Equipment Recommended: Defer to next venue PT Goals  Acute Rehab PT Goals PT Goal Formulation: With patient Time For Goal Achievement: 7 days Pt will go Supine/Side to Sit: with supervision PT Goal: Supine/Side to Sit - Progress: Not met Pt will go Sit to Supine/Side: with supervision PT Goal: Sit to Supine/Side - Progress: Not met Pt will Transfer Sit to Stand/Stand to Sit: with supervision PT Transfer Goal: Sit to Stand/Stand to Sit - Progress: Not met Pt will Ambulate: 51 - 150 feet PT Goal: Ambulate - Progress: Not met  PT Evaluation Precautions/Restrictions  Precautions Precautions: Knee Required Braces or Orthoses: Yes Knee Immobilizer: On when out of bed or walking Restrictions Weight Bearing Restrictions: No Prior Functioning  Home Living Lives With: Spouse Receives Help From: Family Type of Home: House Home Layout: One level Home Access: Stairs to enter Entrance Stairs-Rails: Right Entrance Stairs-Number of Steps: 3 Prior Function Level of Independence: Independent with gait;Independent with transfers Able to Take Stairs?: Yes Cognition Cognition Arousal/Alertness: Awake/alert Overall Cognitive Status: Appears within functional limits for tasks assessed Orientation Level: Oriented X4 Sensation/Coordination Coordination Gross Motor Movements are Fluid and Coordinated: Yes Fine Motor Movements are Fluid and Coordinated: Yes Extremity Assessment RUE Assessment RUE Assessment: Within Functional Limits LUE Assessment LUE Assessment: Within Functional Limits RLE Assessment RLE Assessment: Exceptions to [facility] RLE PROM (degrees) Right Knee Extension 0-130: -10  (-  10) Right Knee Flexion 0-140: 35  LLE Assessment LLE Assessment: Within Functional Limits Mobility (including Balance) Bed Mobility Bed Mobility:  Yes Supine to Sit: 3: Mod assist Transfers Transfers: Yes Sit to Stand: 1: +2 Total assist Sit to Stand Details (indicate cue type and reason): pt 60% Stand to Sit: 1: +2 Total assist Stand to Sit Details: pt 60% with assist to move R LE fwd Ambulation/Gait Ambulation/Gait: Yes Ambulation/Gait Assistance: 1: +2 Total assist Ambulation/Gait Assistance Details (indicate cue type and reason): pt 70% with cues for sequence, position from RW, and posture Ambulation Distance (Feet): 39 Feet Assistive device: Rolling walker Gait Pattern: Step-to pattern    Exercise  Total Joint Exercises Ankle Circles/Pumps: AROM;10 reps;Supine Quad Sets: AROM;10 reps;Supine Heel Slides: AAROM;10 reps;Supine Straight Leg Raises: AAROM;10 reps;Supine End of Session PT - End of Session Activity Tolerance: Patient tolerated treatment well Patient left: in chair Nurse Communication: Mobility status for transfers;Mobility status for ambulation General Behavior During Session: [facility] for tasks performed Cognition: [facility] for tasks performed  Lauri Purdum 06/22/2011, 11:29 AM

## 2011-06-22 NOTE — Progress Notes (Signed)
CSW has confirmed with [facility] that Mrs. Holderfield can transfer to their facility upon discharge from the hospital. [facility] is aware that discharge may be over the weekend and have agreed to accept her. [facility] request with [facility] for SNF rehab. CSW will follow-up tomorrow to verify that insurance approval has been received for a weekend transfer. [facility] would benefit from receiving the discharge summary and medication list on Friday if possible. CSW assessment is located in shadow chart. FL2 has been completed and placed in shadow chart for MD signature. Mechele Claude, LCSW Clinical Social Worker [phone].

## 2011-06-23 LAB — CBC
MCH: 29.6 pg (ref 26.0–34.0)
MCV: 90.8 fL (ref 78.0–100.0)
Platelets: 143 10*3/uL — ABNORMAL LOW (ref 150–400)
RDW: 12.9 % (ref 11.5–15.5)

## 2011-06-23 LAB — BASIC METABOLIC PANEL
CO2: 29 mEq/L (ref 19–32)
Calcium: 9 mg/dL (ref 8.4–10.5)
Creatinine, Ser: 0.65 mg/dL (ref 0.50–1.10)
GFR calc Af Amer: >90 mL/min (ref >90)

## 2011-06-23 LAB — GLUCOSE, CAPILLARY: Glucose-Capillary: 139 mg/dL — ABNORMAL HIGH (ref 70–99)

## 2011-06-23 MED ORDER — OXYCODONE HCL 5 MG PO TABS: 5.0000 mg | ORAL_TABLET | ORAL | Status: AC | PRN

## 2011-06-23 MED ORDER — METHOCARBAMOL 500 MG PO TABS: 500.0000 mg | ORAL_TABLET | Freq: Four times a day (QID) | ORAL | Status: AC | PRN

## 2011-06-23 NOTE — Progress Notes (Signed)
Order received, noted that plan is for pt to D/C to [facility] hopefully sometime over the weekend. Will defer OT eval to SNF--acute OT signing off.  If for some reason the eval needs to be completed for insurance purposes while pt at hospital you can page [employer], OTR/L at [id number] until 4:30 today (06/23/2011) or Pauline Aus at [phone] tomorrow 3:00 (06/24/2011).

## 2011-06-23 NOTE — Progress Notes (Signed)
Physical Therapy Treatment Patient Details Name: [name] MRN: [id number] DOB: October 24, 1950 Today's Date: 06/23/2011 [id number]  TE PT Assessment/Plan  PT - Assessment/Plan Comments on Treatment Session:  (Pt limited by Pain - OOB deferred at pt request.) PT Plan: Discharge plan remains appropriate PT Frequency: 7X/week Follow Up Recommendations: Skilled nursing facility PT Goals     PT Treatment Precautions/Restrictions  Precautions Precautions: Knee Required Braces or Orthoses: Yes Knee Immobilizer: On when out of bed or walking Restrictions Weight Bearing Restrictions: No Mobility (including Balance) Bed Mobility Bed Mobility: No Transfers Transfers: No Ambulation/Gait Ambulation/Gait: No    Exercise  Total Joint Exercises Ankle Circles/Pumps: AROM;20 reps;Supine Quad Sets: AROM;20 reps;Supine Heel Slides: AAROM;10 reps;Supine Straight Leg Raises: AAROM;Right;20 reps;Supine End of Session PT - End of Session Activity Tolerance: Patient limited by pain Patient left: in bed;with call bell in reach;with family/visitor present General Behavior During Session: [facility] for tasks performed Cognition: [facility] for tasks performed  [name] 06/23/2011, 12:23 PM

## 2011-06-23 NOTE — Progress Notes (Deleted)
Attempted to see pt for OT evaluation.  Pt with 10/10 pain and deferring evaluation at this time.  PT stated pt in great pain during session this am.  Will return as schedule allows.

## 2011-06-23 NOTE — Progress Notes (Signed)
Subjective: 2 Days Post-Op Procedure(s) (LRB): TOTAL KNEE ARTHROPLASTY (Right) Patient reports pain as mild.   Patient seen in rounds with Dr. Wynelle Link. Patient has complaints of only mild pain but was worse last night.  Plan is to go to SNF tomorrow.  Staff said bed will be available at [facility] on Saturday.  Will set up for transfer tomorrow.  Objective: Vital signs in last 24 hours: Temp:  [98.6 F (37 C)-102.6 F (39.2 C)] 99 F (37.2 C) (11/09 0600) Pulse Rate:  [56-96] 93  (11/09 0600) Resp:  [16-18] 18  (11/09 0600) BP: (135-145)/(66-88) 135/71 mmHg (11/09 0600) SpO2:  [90 %-97 %] 97 % (11/09 0600)  Intake/Output from previous day:  Intake/Output Summary (Last 24 hours) at 06/23/11 1247 Last data filed at 06/23/11 0730  Gross per 24 hour  Intake   2245 ml  Output   4800 ml  Net  -2555 ml    Intake/Output this shift: Total I/O In: 240 [P.O.:240] Out: -    Basename 06/23/11 0441 06/22/11 0447  HGB 11.9* 12.2    Basename 06/23/11 0441 06/22/11 0447  WBC 9.5 6.3  RBC 4.02 4.09  HCT 36.5 36.9  PLT 143* 139*    Basename 06/23/11 0441 06/22/11 0447  NA 136 137  K 4.2 4.5  CL 101 102  CO2 29 28  BUN 6 7  CREATININE 0.65 0.64  GLUCOSE 131* 109*  CALCIUM 9.0 8.8    Exam - Neurovascular intact Sensation intact distally Intact pulses distally Dressing/Incision - clean, dry, no drainage Motor function intact - moving foot and toes well on exam.   Assessment/Plan: 2 Days Post-Op Procedure(s) (LRB): TOTAL KNEE ARTHROPLASTY (Right)  Advance diet Up with therapy Discharge to SNF tomorrow morning.  Past Medical History  Diagnosis Date  . Heart murmur     as a child  . Pneumonia     1980's  . Sleep apnea     States SEVERE- last study  8/12/CPAP- states setting on 12  . Diabetes mellitus     diet controlled  . GERD (gastroesophageal reflux disease)     Barretts esophagitis per pt  . Blood transfusion     1981  . Arthritis    Rheumatoid arthritis,   . Hiatal hernia     sjorgens syndrome  . Fibromyalgia   . Peripheral autonomic neuropathy of unknown cause   . No pertinent past medical history     [place] note Dr s.Stoneking, [place] on chart   . Dysrhythmia     /benign PVC's/ EKG 8/12 on  chart    DVT Prophylaxis - Xarelto  Protocol Weight-Bearing as tolerated to right leg Continue therapy. Saline lock IV Transfer tomorrow to SNF - [place] 06/23/2011, 12:47 PM

## 2011-06-23 NOTE — Progress Notes (Addendum)
CSW spoke with [name] today. They have received the approval from [employer] for [name] to transfer to them tomorrow. CSW has updated [name] and [name] at bedside. FL2 in shadow chart still needs MD signature. Also discharge summary is needed and paper signed prescriptions for any narcotics. [name] agrees that ambulance transport is needed. Mechele Claude, LCSW Clinical Social Worker [phone].

## 2011-06-23 NOTE — Progress Notes (Signed)
Physical Therapy Treatment Patient Details Name: [name] MRN: [id number] DOB: [birth year] Today's Date: 06/23/2011 1355 - 64  GT PT Assessment/Plan  PT - Assessment/Plan PT Plan: Discharge plan remains appropriate PT Frequency: 7X/week Follow Up Recommendations: Skilled nursing facility Equipment Recommended: Defer to next venue PT Goals  Acute Rehab PT Goals PT Goal: Supine/Side to Sit - Progress: Partly met PT Goal: Sit to Supine/Side - Progress: Partly met PT Transfer Goal: Sit to Stand/Stand to Sit - Progress: Partly met PT Goal: Ambulate - Progress: Partly met  PT Treatment Precautions/Restrictions  Precautions Precautions: Knee Required Braces or Orthoses: Yes Knee Immobilizer: On when out of bed or walking Restrictions Weight Bearing Restrictions: No Mobility (including Balance) Bed Mobility Bed Mobility: Yes Supine to Sit: 3: Mod assist Sit to Supine - Right: 3: Mod assist Transfers Transfers: Yes Sit to Stand: 3: Mod assist Sit to Stand Details (indicate cue type and reason): pt 70% with cues for use of UEs Stand to Sit: 3: Mod assist Stand to Sit Details: pt 60% with cues for LE position and use of UEs Ambulation/Gait Ambulation/Gait: Yes Ambulation/Gait Assistance: 4: Min assist;3: Mod assist Ambulation/Gait Assistance Details (indicate cue type and reason): 70% with cues for position from RW and posture Ambulation Distance (Feet): 78 Feet Assistive device: Rolling walker Gait Pattern: Step-to pattern    Exercise    End of Session PT - End of Session Activity Tolerance: Patient tolerated treatment well Patient left: in bed Nurse Communication: Other (comment) (Pt pain level communicated and RN assessing) General Behavior During Session: [facility] for tasks performed Cognition: [facility] for tasks performed  [name] 06/23/2011, 2:23 PM

## 2011-06-23 NOTE — Discharge Summary (Signed)
Physician Discharge Summary   Patient ID: [name] MRN: [id number] DOB/AGE: 1950-09-24 60 y.o.  Admit date: 06/21/2011 Discharge date: 06/24/2011  Primary Diagnosis: Osteoarthritis, knee (715.96)  Impression: Right Knee  Admission Diagnoses: Cardiac Arrhythmia . Frequent PVC's  Chronic Pain  Diabetes Mellitus, Type II. Diet-Controlled  Fibromyalgia  Gastroesophageal Reflux Disease  Hypercholesterolemia  Irritable bowel syndrome  Migraine Headache  Osteoarthritis  Osteoporosis  Peripheral Neuropathy  Rheumatoid Arthritis  Skin Cancer. Basal Cell  Sleep Apnea. CPAP  Barrett's Esophagus  Hiatal Hernia  Past History of Gastritis  Urinary Incontinence. 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 year old female with end stage OA of his left knee with progressively worsening pain and dysfunction. She has constant pain, with activity and at rest and significant functional deficits with difficulties even with ADLs. Se has had extensive non-op management including analgesics, injections of cortisone and viscosupplements, and home exercise program, but remains in significant pain with significant dysfunction. SHe has bone on bone arthritis in the medial and patellofemoral compartments and tibial subluxation on the femur. She presents now for left Total Knee Arthroplasty.   Laboratory Data: [facility]  06/23/11 0441  06/22/11 0447   HGB  11.9*  12.2     Basename  06/23/11 0441  06/22/11 0447   WBC  9.5  6.3   RBC  4.02  4.09   HCT  36.5  36.9   PLT  143*  139*     Basename  06/23/11 0441  06/22/11 0447   NA  136  137   K  4.2  4.5   CL  101  102   CO2  29  28   BUN  6  7     CREATININE  0.65  0.64   GLUCOSE  131*  109*   CALCIUM  9.0  8.8      X-Rays:Dg Chest 2 View  06/13/2011  *RADIOLOGY REPORT*  Clinical Data: Preoperative respiratory films.  CHEST - 2 VIEW  Comparison: PA and lateral chest 04/06/2010.  Findings: Lungs are clear.  Cardiomegaly is noted.  No pneumothorax or pleural effusion.  Thoracolumbar scoliosis is again seen.  IMPRESSION: No acute finding.  Stable compared prior exam.  Original Report Authenticated By: Arvid Right. D'ALESSIO, M.D.    EKG:No orders found for this or any previous visit.   Hospital Course:  Patient was admitted to [facility] and taken to the OR and underwent the above state procedure without complications.  Patient tolerated the procedure well and was later transferred to the recovery room and then to the orthopaedic floor for postoperative care.  They were given PO and IV analgesics for pain control following their surgery.  They were given 24 hours of postoperative antibiotics and started on DVT prophylaxis.   PT and OT were ordered for total joint protocol.  Discharge planning consulted to help with postop disposition and equipment needs.  Patient had a decent  night on the evening of surgery and started to get up with therapy on day one.  PCA was discontinued and they were weaned over to PO meds.  Hemovac drain was pulled without difficulty.  Continued to progress with therapy into  day two.  Dressing was changed on day two and the incision was healing well.  We got social worker involved right after surgery because the patient wanted to look into SNF after the stay.  She wanted to look into [facility].  On the afternoon of postop day 2, the staff let Dr. Wynelle Link know that the bed would be available on Saturday for the patient.  We setup discharge for the weekend.  As long as she progresses and doing well on Saturday, she will be transferred over the weekend.    Discharge Medications: cycloSPORINE 2 drop,  Both Eyes, BID   docusate sodium 100 mg, PO, BID   hyoscyamine 0.375 mg, PO, BID   Nexium 40mg  PO Bid pregabalin 75 mg, PO, BID  pregabalin (LYRICA) capsule 150 mg, PO, QHS  rivaroxaban 10 mg, PO, Q24H for 18 more days then discontinue. acetaminophen 650 mg, PO, Q6H PRN bisacodyl 10 mg, PO, Daily PRN diphenhydrAMINE 12.5-25 mg, PO, Q4H PRN magnesium hydroxide 30 mL, PO, Q12H PRN methocarbamol 500 mg, PO, Q6H PRN ondansetron 4 mg, PO, Q6H PRN oxyCODONE 10 mg, PO, Q4H PRN polyethylene glycol 17 g, PO, Daily PRN temazepam 15-30 mg, PO, QHS PRN   Diet: cardiac, heart-healthy  Activity:WBAT   Follow-up:in 2 weeks  Disposition: [facility]  Discharged Condition: stable, transferred when improved and bed available.      SignedMickel Crow 06/23/2011, 6:17 PM

## 2011-06-23 NOTE — Progress Notes (Signed)
Physical Therapy Treatment Patient Details Name: [name] MRN: [id number] DOB: 09-08-1950 Today's Date: 06/23/2011 1105 - 1127  GT PT Assessment/Plan  PT - Assessment/Plan Comments on Treatment Session:  (Pt limited by Pain - OOB deferred at pt request.) PT Plan: Discharge plan needs to be updated PT Frequency: 7X/week Follow Up Recommendations: Skilled nursing facility PT Goals  Acute Rehab PT Goals PT Goal: Supine/Side to Sit - Progress: Not met PT Goal: Sit to Supine/Side - Progress: Not met PT Transfer Goal: Sit to Stand/Stand to Sit - Progress: Not met PT Goal: Ambulate - Progress: Not met  PT Treatment Precautions/Restrictions  Precautions Precautions: Knee Required Braces or Orthoses: Yes Knee Immobilizer: On when out of bed or walking Restrictions Weight Bearing Restrictions: No Mobility (including Balance) Bed Mobility Bed Mobility: Yes Supine to Sit: 3: Mod assist Sit to Supine - Right: 3: Mod assist Transfers Transfers: Yes Sit to Stand: 1: +2 Total assist Sit to Stand Details (indicate cue type and reason): pt 70% with cues for use of UEs Stand to Sit: 1: +2 Total assist Stand to Sit Details: pt 60% with cues for LE position and use of UEs Ambulation/Gait Ambulation/Gait: Yes Ambulation/Gait Assistance: 1: +2 Total assist;Patient percentage (comment) Ambulation/Gait Assistance Details (indicate cue type and reason): 70% with cues for position from RW and posture Ambulation Distance (Feet): 14 Feet Assistive device: Rolling walker Gait Pattern: Step-to pattern   End of Session PT - End of Session Activity Tolerance: Patient limited by pain Patient left: in chair Nurse Communication: Other (comment) (Pt pain level communicated and RN assessing) General Behavior During Session: [facility] for tasks performed Cognition: [facility] for tasks performed  [name] 06/23/2011, 12:31 PM

## 2011-06-24 DIAGNOSIS — Z96651 Presence of right artificial knee joint: Secondary | ICD-10-CM

## 2011-06-24 LAB — CBC
HCT: 36 % (ref 36.0–46.0)
Hemoglobin: 11.7 g/dL — ABNORMAL LOW (ref 12.0–15.0)
MCHC: 32.5 g/dL (ref 30.0–36.0)

## 2011-06-24 MED ORDER — RIVAROXABAN 10 MG PO TABS: 10.0000 mg | ORAL_TABLET | ORAL | Status: DC

## 2011-06-24 MED ORDER — RIVAROXABAN 10 MG PO TABS: 10.0000 mg | ORAL_TABLET | ORAL | Status: AC

## 2011-06-24 NOTE — Progress Notes (Signed)
Contacted facility.  Facility requesting D/c summary.  Faxed D/c summary.  Spoke with Pt and husband.  Discussed D/c and transportation to facility.  Printed information from [facility] for facility.  Contacted EMS.  Pt d/c'd.  Bernita Raisin, [place]

## 2011-06-24 NOTE — Progress Notes (Signed)
Physical Therapy Treatment Patient Details Name: [name] MRN: [id number] DOB: 1951/07/26 Today's Date: 06/24/2011 0920 - 1000  GT, 2TE PT Assessment/Plan  PT - Assessment/Plan Comments on Treatment Session: Marked improvement in activity tolerance.  Knee flex continues ltd by Pain/muscle guarding PT Plan: Discharge plan remains appropriate PT Goals  Acute Rehab PT Goals PT Goal: Supine/Side to Sit - Progress: Partly met PT Goal: Sit to Supine/Side - Progress: Partly met PT Transfer Goal: Sit to Stand/Stand to Sit - Progress: Partly met PT Goal: Ambulate - Progress: Partly met  PT Treatment Precautions/Restrictions  Precautions Precautions: Knee Required Braces or Orthoses: Yes Knee Immobilizer: On when out of bed or walking Restrictions Weight Bearing Restrictions: No Mobility (including Balance) Bed Mobility Supine to Sit: 4: Min assist Transfers Transfers: Yes Sit to Stand: 4: Min assist Stand to Sit: 4: Min assist Ambulation/Gait Ambulation/Gait Assistance: 4: Min assist Ambulation/Gait Assistance Details (indicate cue type and reason): cues for position from RW Ambulation Distance (Feet): 100 Feet Assistive device: Rolling walker Gait Pattern: Step-to pattern    Exercise  Total Joint Exercises Ankle Circles/Pumps: AROM;Both;20 reps;Supine Quad Sets: AROM;Both;20 reps;Supine Heel Slides: AAROM;Right;15 reps;Supine Straight Leg Raises: AAROM;Right;20 reps;Supine End of Session PT - End of Session Activity Tolerance: Patient tolerated treatment well Patient left: in chair;with call bell in reach;with family/visitor present General Behavior During Session: [facility] for tasks performed Cognition: [facility] for tasks performed  Kimball Appleby 06/24/2011, 12:20 PM

## 2011-06-24 NOTE — Progress Notes (Signed)
[name]  MRN: [id number] DOB/Age: 59-Jun-1952 59 y.o. Physician: Ander Slade, M.D. 3 Days Post-Op Procedure(s) (LRB): TOTAL KNEE ARTHROPLASTY (Right)  Subjective: Feeling much better today. Anxious for dc to snf Vital Signs Temp:  [98.6 F (37 C)-101 F (38.3 C)] 98.6 F (37 C) (11/10 0509) Pulse Rate:  [51-65] 65  (11/10 0509) Resp:  [16] 16  (11/10 0509) BP: (106-121)/(67-68) 121/67 mmHg (11/10 0509) SpO2:  [93 %-95 %] 95 % (11/10 0509)  Lab Results  Basename 06/24/11 0450 06/23/11 0441  WBC 8.3 9.5  HGB 11.7* 11.9*  HCT 36.0 36.5  PLT 151 143*   BMET  Basename 06/23/11 0441 06/22/11 0447  NA 136 137  K 4.2 4.5  CL 101 102  CO2 29 28  GLUCOSE 131* 109*  BUN 6 7  CREATININE 0.65 0.64  CALCIUM 9.0 8.8   INR  Date Value Range Status  06/13/2011 0.96  0.00-1.49 (no units) Final     Exam  Dressings dry, nvi,   Plan Dc to snf [name] 06/24/2011, 9:10 AM

## 2011-06-24 NOTE — Progress Notes (Signed)
Report called to sandra at the rehab facility.vw,rn.

## 2011-06-26 ENCOUNTER — Encounter (HOSPITAL_COMMUNITY): Payer: Self-pay | Admitting: Orthopedic Surgery

## 2012-03-26 ENCOUNTER — Other Ambulatory Visit: Payer: Self-pay | Admitting: Otolaryngology

## 2012-03-26 ENCOUNTER — Other Ambulatory Visit (HOSPITAL_COMMUNITY)
Admission: RE | Admit: 2012-03-26 | Discharge: 2012-03-26 | Disposition: A | Discharge status: Home / Self Care | Payer: Managed Care, Other (non HMO) | Source: Ambulatory Visit | Attending: Otolaryngology | Admitting: Otolaryngology

## 2012-03-26 DIAGNOSIS — R221 Localized swelling, mass and lump, neck: Secondary | ICD-10-CM | POA: Insufficient documentation

## 2012-03-26 DIAGNOSIS — R220 Localized swelling, mass and lump, head: Secondary | ICD-10-CM | POA: Insufficient documentation

## 2012-04-03 ENCOUNTER — Other Ambulatory Visit: Payer: Self-pay | Admitting: Otolaryngology

## 2012-04-03 DIAGNOSIS — R221 Localized swelling, mass and lump, neck: Secondary | ICD-10-CM

## 2012-04-05 ENCOUNTER — Ambulatory Visit
Admission: RE | Admit: 2012-04-05 | Discharge: 2012-04-05 | Disposition: A | Discharge status: Home / Self Care | Payer: Managed Care, Other (non HMO) | Source: Ambulatory Visit | Attending: Otolaryngology | Admitting: Otolaryngology

## 2012-04-05 DIAGNOSIS — R221 Localized swelling, mass and lump, neck: Secondary | ICD-10-CM

## 2012-04-05 MED ORDER — IOHEXOL 300 MG/ML  SOLN
75.0000 mL | Freq: Once | INTRAMUSCULAR | Status: AC | PRN
  Administered 2012-04-05: 75 mL via INTRAVENOUS

## 2012-11-20 LAB — HM COLONOSCOPY

## 2013-05-29 ENCOUNTER — Other Ambulatory Visit (HOSPITAL_COMMUNITY): Payer: Self-pay

## 2013-05-30 ENCOUNTER — Ambulatory Visit (HOSPITAL_COMMUNITY)
Admission: RE | Admit: 2013-05-30 | Discharge: 2013-05-30 | Disposition: A | Discharge status: Home / Self Care | Payer: Managed Care, Other (non HMO) | Source: Ambulatory Visit | Attending: Rheumatology | Admitting: Rheumatology

## 2013-05-30 DIAGNOSIS — M810 Age-related osteoporosis without current pathological fracture: Secondary | ICD-10-CM | POA: Insufficient documentation

## 2013-05-30 MED ORDER — ZOLEDRONIC ACID 5 MG/100ML IV SOLN
INTRAVENOUS | Status: AC
  Filled 2013-05-30: qty 100

## 2013-05-30 MED ORDER — ZOLEDRONIC ACID 5 MG/100ML IV SOLN
5.0000 mg | Freq: Once | INTRAVENOUS | Status: AC
  Administered 2013-05-30: 5 mg via INTRAVENOUS

## 2013-06-26 ENCOUNTER — Other Ambulatory Visit: Payer: Self-pay | Admitting: Cardiology

## 2013-10-06 ENCOUNTER — Encounter: Payer: Self-pay | Admitting: Cardiology

## 2013-10-06 DIAGNOSIS — R011 Cardiac murmur, unspecified: Secondary | ICD-10-CM | POA: Insufficient documentation

## 2013-10-06 DIAGNOSIS — G9009 Other idiopathic peripheral autonomic neuropathy: Secondary | ICD-10-CM | POA: Insufficient documentation

## 2013-10-06 DIAGNOSIS — K449 Diaphragmatic hernia without obstruction or gangrene: Secondary | ICD-10-CM | POA: Insufficient documentation

## 2013-10-06 DIAGNOSIS — M069 Rheumatoid arthritis, unspecified: Secondary | ICD-10-CM | POA: Insufficient documentation

## 2013-10-06 DIAGNOSIS — M797 Fibromyalgia: Secondary | ICD-10-CM | POA: Insufficient documentation

## 2013-10-06 DIAGNOSIS — I493 Ventricular premature depolarization: Secondary | ICD-10-CM | POA: Insufficient documentation

## 2013-10-06 DIAGNOSIS — G4733 Obstructive sleep apnea (adult) (pediatric): Secondary | ICD-10-CM | POA: Insufficient documentation

## 2013-10-09 ENCOUNTER — Ambulatory Visit: Payer: Medicare Other | Admitting: Cardiology

## 2013-11-16 ENCOUNTER — Encounter: Payer: Self-pay | Admitting: Cardiology

## 2013-11-17 ENCOUNTER — Ambulatory Visit (INDEPENDENT_AMBULATORY_CARE_PROVIDER_SITE_OTHER): Payer: Private Health Insurance - Indemnity | Admitting: Cardiology

## 2013-11-17 ENCOUNTER — Encounter: Payer: Self-pay | Admitting: Cardiology

## 2013-11-17 ENCOUNTER — Telehealth: Payer: Self-pay

## 2013-11-17 VITALS — BP 128/40 | HR 61 | Ht 62.0 in | Wt 234.6 lb

## 2013-11-17 DIAGNOSIS — G4733 Obstructive sleep apnea (adult) (pediatric): Secondary | ICD-10-CM

## 2013-11-17 DIAGNOSIS — I493 Ventricular premature depolarization: Secondary | ICD-10-CM

## 2013-11-17 DIAGNOSIS — I4949 Other premature depolarization: Secondary | ICD-10-CM

## 2013-11-17 DIAGNOSIS — Z6836 Body mass index (BMI) 36.0-36.9, adult: Secondary | ICD-10-CM | POA: Insufficient documentation

## 2013-11-17 NOTE — Progress Notes (Signed)
[address], [place], [place]  [address] Phone: [phone] Fax:  [phone]  Date:  11/17/2013   ID:  [name], DOB December 11, 1950, MRN [id number]  PCP:  Mathews Argyle, MD  Cardiologist:  Fransico Him, MD     History of Present Illness: [name] is a 63 y.o. female with a history of PVC's, obesity and OSA who presents today for followup.  She is doing well.  She denies any palpitations.  She tolerates her CPAP without any problems.  She feels rested in the am and has no daytime sleepiness.  She tolerates the nasal pillow mask with chin strap and feels the pressure is adequate.  She has had a lot of insomnia and her rheumatologist has given her a prescription for ambien.  She denies any chest pain, SOB, dizziness or syncope.  She has chronic PVC's with she feels but they do not bother her   Wt Readings from Last 3 Encounters:  11/17/13 234 lb 9.6 oz (106.414 kg)  05/30/13 227 lb (102.967 kg)  06/21/11 237 lb (107.502 kg)     Past Medical History  Diagnosis Date  . Heart murmur     as a child  . Pneumonia     1980's  . Sleep apnea     States SEVERE- last study  8/12/CPAP- states setting on 12  . Diabetes mellitus     diet controlled  . GERD (gastroesophageal reflux disease)     Barretts esophagitis per pt  . Blood transfusion     1981  . Arthritis     Rheumatoid arthritis,   . Hiatal hernia     sjorgens syndrome  . Fibromyalgia   . Peripheral autonomic neuropathy of unknown cause   . No pertinent past medical history     [facility] note Dr s.Stoneking, [place] on chart   . Dysrhythmia     /benign PVC's/ EKG 8/12 on  chart  . Diastolic dysfunction     Current Outpatient Prescriptions  Medication Sig Dispense Refill  . cycloSPORINE (RESTASIS) 0.05 % ophthalmic emulsion Place 2 drops into both eyes 2 (two) times daily.       . diphenoxylate-atropine (LOMOTIL) 2.5-0.025 MG per tablet Take 1 tablet by mouth as needed.       [name] SURECLICK  50 MG/ML injection Inject 50 mg into the skin once a week.       . gabapentin (NEURONTIN) 400 MG capsule Take 400 mg by mouth at bedtime.       [name] HYDROcodone-acetaminophen (NORCO/VICODIN) 5-325 MG per tablet Take 1 tablet by mouth every 6 (six) hours as needed for pain.      . hydroxychloroquine (PLAQUENIL) 200 MG tablet Take 200 mg by mouth 2 (two) times daily.      . hyoscyamine (LEVBID) 0.375 MG 12 hr tablet Take 0.375 mg by mouth daily.       . metoprolol succinate (TOPROL-XL) 25 MG 24 hr tablet TAKE 1 TABLET DAILY  90 tablet  1  . Multiple Vit-Min-Calcium-FA (FOLGARD OS PO) Take 1 tablet by mouth 2 (two) times daily.       . pantoprazole (PROTONIX) 40 MG tablet Take 40 mg by mouth daily.      . promethazine (PHENERGAN) 25 MG tablet Take 25 mg by mouth every 6 (six) hours as needed.        . zolpidem (AMBIEN) 10 MG tablet Take 10 mg by mouth at bedtime.  No current facility-administered medications for this visit.   Facility-Administered Medications Ordered in Other Visits  Medication Dose Route Frequency Provider Last Rate Last Dose  . chlorhexidine (HIBICLENS) 4 % liquid 4 application  60 mL Topical Once Gearlean Alf, MD      . chlorhexidine (HIBICLENS) 4 % liquid 4 application  60 mL Topical Once Gearlean Alf, MD        Allergies:    Allergies  Allergen Reactions  . Demerol Hives and Other (See Comments)    Fever   . Ivp Dye [Iodinated Diagnostic Agents] Hives and Other (See Comments)    Fever   . Morphine And Related Other (See Comments)    ineffective  . Adhesive [Tape] Rash    Social History:  The patient  reports that she has never smoked. She has never used smokeless tobacco. She reports that she drinks alcohol. She reports that she does not use illicit drugs.   Family History:  The patient's family history includes Alzheimer's disease in her mother; Cancer in her father; Heart attack in her brother; Heart disease in her brother; Hyperlipidemia in her brother.    ROS:  Please see the history of present illness.      All other systems reviewed and negative.   PHYSICAL EXAM: VS:  Ht 5\' 2"  (1.575 m)  Wt 234 lb 9.6 oz (106.414 kg)  BMI 42.90 kg/m2 Well nourished, well developed, in no acute distress HEENT: normal Neck: no JVD Cardiac:  normal S1, S2; RRR; no murmur Lungs:  clear to auscultation bilaterally, no wheezing, rhonchi or rales Abd: soft, nontender, no hepatomegaly Ext: no edema Skin: warm and dry Neuro:  CNs 2-12 intact, no focal abnormalities noted  EKG:   NSR with PVC's  ASSESSMENT AND PLAN:  1. PVC's - tolerates well - continue metoprolol 2. OSA on CPAP and tolerating well - I will get a download from her DME 3. Obesity - Her exercise is limited by her peripheral neuropathy and back pain  Followup with me in 6 months  Signed, Fransico Him, MD 11/17/2013 11:53 AM

## 2013-11-17 NOTE — Patient Instructions (Signed)
Your physician recommends that you continue on your current medications as directed. Please refer to the Current Medication list given to you today.  [facility] to [facility] to [facility]  Your physician wants you to follow-up in: 6 months You will receive a reminder letter in the mail two months in advance. If you don't receive a letter, please call our office to schedule the follow-up appointment.

## 2013-11-17 NOTE — Telephone Encounter (Signed)
lmom for pt to call back.called to move up pt appt time

## 2013-11-21 ENCOUNTER — Encounter: Payer: Self-pay | Admitting: General Surgery

## 2014-01-09 ENCOUNTER — Other Ambulatory Visit: Payer: Self-pay | Admitting: Cardiology

## 2014-01-19 ENCOUNTER — Ambulatory Visit: Payer: Medicare Other | Admitting: Neurology

## 2014-02-02 ENCOUNTER — Ambulatory Visit (INDEPENDENT_AMBULATORY_CARE_PROVIDER_SITE_OTHER): Payer: Private Health Insurance - Indemnity | Admitting: Neurology

## 2014-02-02 ENCOUNTER — Encounter (INDEPENDENT_AMBULATORY_CARE_PROVIDER_SITE_OTHER): Payer: Self-pay

## 2014-02-02 ENCOUNTER — Encounter: Payer: Self-pay | Admitting: Neurology

## 2014-02-02 VITALS — BP 106/58 | HR 68 | Ht 61.5 in | Wt 234.0 lb

## 2014-02-02 DIAGNOSIS — M797 Fibromyalgia: Secondary | ICD-10-CM

## 2014-02-02 DIAGNOSIS — E1142 Type 2 diabetes mellitus with diabetic polyneuropathy: Secondary | ICD-10-CM

## 2014-02-02 DIAGNOSIS — G629 Polyneuropathy, unspecified: Secondary | ICD-10-CM | POA: Insufficient documentation

## 2014-02-02 DIAGNOSIS — IMO0001 Reserved for inherently not codable concepts without codable children: Secondary | ICD-10-CM

## 2014-02-02 MED ORDER — GABAPENTIN 300 MG PO CAPS: ORAL_CAPSULE | ORAL | Status: DC

## 2014-02-02 NOTE — Progress Notes (Signed)
Reason for visit: Peripheral neuropathy  [name] is a 63 y.o. female  History of present illness:  [name] is a 63 year old right-handed white female with a history of a peripheral neuropathy. The patient has been felt in the past to have diabetes, but her hemoglobin A1c readings have been within normal limits over a number of years. The patient was first felt to have a peripheral neuropathy in 1999, but the symptoms have gradually worsened over the last several years, particularly over the last 6-12 months. She had nerve conduction studies done through this office in 2011 confirming a peripheral neuropathy. She has Sjogren syndrome as well. She indicates that she has some tingling sensations in the feet and up the legs up to the knees, and some tingling in the hands. The symptoms are worse at night, better during the day. She has some chronic low back pain as well. The patient has been on a number of medications in the past that include Lyrica, Cymbalta, and Topamax. She currently is on gabapentin only 400 mg at night. The patient has some gait instability, but she uses a walker for ambulation. She last fell about one year ago. The patient is sent to this office for further evaluation.  Past Medical History  Diagnosis Date  . Heart murmur     as a child  . Pneumonia     1980's  . Sleep apnea     States SEVERE- last study  8/12/CPAP- states setting on 12  . Diabetes mellitus     diet controlled  . GERD (gastroesophageal reflux disease)     Barretts esophagitis per pt  . Blood transfusion     1981  . Arthritis     Rheumatoid arthritis,   . Hiatal hernia     sjorgens syndrome  . Fibromyalgia   . Peripheral autonomic neuropathy of unknown cause   . No pertinent past medical history     [facility]arence note Dr s.Stoneking, [place] on chart   . Dysrhythmia     /benign PVC's/ EKG 8/12 on  chart  . Diastolic dysfunction   . Polyneuropathy in diabetes(357.2) 02/02/2014    Past  Surgical History  Procedure Laterality Date  . Tonsillectomy    . Knee arthroscopy      x2  . Diagnostic laparoscopy      x3  . Tendon repair  1980    left ankle and tibia  . Abdominal hysterectomy      BTL, BSO  . Cholecystectomy    . Tubal ligation    . Patotid cystectomy    . Total knee arthroplasty  06/21/2011    Procedure: TOTAL KNEE ARTHROPLASTY;  Surgeon: Gearlean Alf;  Location: [facility];  Service: Orthopedics;  Laterality: Right;    Family History  Problem Relation Age of Onset  . Alzheimer's disease Mother   . Heart attack Mother   . Hypertension Mother   . Glaucoma Mother   . Cancer Father   . Heart attack Brother   . Heart disease Brother   . Glaucoma Brother   . Hyperlipidemia Brother   . Glaucoma Brother     Social history:  reports that she has never smoked. She has never used smokeless tobacco. She reports that she drinks alcohol. She reports that she does not use illicit drugs.  Medications:  Current Outpatient Prescriptions on File Prior to Visit  Medication Sig Dispense Refill  . cycloSPORINE (RESTASIS) 0.05 % ophthalmic emulsion Place 2 drops  into both eyes 2 (two) times daily.       . diphenoxylate-atropine (LOMOTIL) 2.5-0.025 MG per tablet Take 1 tablet by mouth as needed.       [name] SURECLICK 50 MG/ML injection Inject 50 mg into the skin once a week.       [name] HYDROcodone-acetaminophen (NORCO/VICODIN) 5-325 MG per tablet Take 1 tablet by mouth every 6 (six) hours as needed for pain.      . hydroxychloroquine (PLAQUENIL) 200 MG tablet Take 200 mg by mouth 2 (two) times daily.      . hyoscyamine (LEVBID) 0.375 MG 12 hr tablet Take 0.375 mg by mouth 2 (two) times daily.       . metoprolol succinate (TOPROL-XL) 25 MG 24 hr tablet TAKE 1 TABLET DAILY  90 tablet  0  . Multiple Vit-Min-Calcium-FA (FOLGARD OS PO) Take 1 tablet by mouth 2 (two) times daily.       . pantoprazole (PROTONIX) 40 MG tablet Take 40 mg by mouth daily.      . promethazine  (PHENERGAN) 25 MG tablet Take 25 mg by mouth every 6 (six) hours as needed.        . zolpidem (AMBIEN) 10 MG tablet Take 10 mg by mouth at bedtime as needed.        Current Facility-Administered Medications on File Prior to Visit  Medication Dose Route Frequency Provider Last Rate Last Dose  . chlorhexidine (HIBICLENS) 4 % liquid 4 application  60 mL Topical Once Gaynelle Arabian V, MD      . chlorhexidine (HIBICLENS) 4 % liquid 4 application  60 mL Topical Once Gearlean Alf, MD          Allergies  Allergen Reactions  . Demerol Hives and Other (See Comments)    Fever   . Ivp Dye [Iodinated Diagnostic Agents] Hives and Other (See Comments)    Fever   . Morphine And Related Other (See Comments)    ineffective  . Adhesive [Tape] Rash    ROS:  Out of a complete 14 system review of symptoms, the patient complains only of the following symptoms, and all other reviewed systems are negative.  Weight gain, fatigue Palpitations of the heart Itching Diarrhea Urinary incontinence Easy bruising Feeling cold, flushing Joint pain, joint swelling, muscle cramps, achy muscles Allergies, runny nose Numbness, weakness, tremor Not enough sleep, decreased energy Insomnia, restless legs  Blood pressure 106/58, pulse 68, height 5' 1.5" (1.562 m), weight 234 lb (106.142 kg).  Physical Exam  General: The patient is alert and cooperative at the time of the examination. The patient is markedly obese.  Eyes: Pupils are equal, round, and reactive to light. Discs are flat bilaterally.  Neck: The neck is supple, no carotid bruits are noted.  Respiratory: The respiratory examination is clear.  Cardiovascular: The cardiovascular examination reveals a regular rate and rhythm, no obvious murmurs or rubs are noted.  Skin: Extremities are without significant edema.  Neurologic Exam  Mental status: The patient is alert and oriented x 3 at the time of the examination. The patient has apparent normal  recent and remote memory, with an apparently normal attention span and concentration ability.  Cranial nerves: Facial symmetry is present. There is good sensation of the face to pinprick and soft touch bilaterally. The strength of the facial muscles and the muscles to head turning and shoulder shrug are normal bilaterally. Speech is well enunciated, no aphasia or dysarthria is noted. Extraocular movements are full. Visual fields  are full. The tongue is midline, and the patient has symmetric elevation of the soft palate. No obvious hearing deficits are noted.  Motor: The motor testing reveals 5 over 5 strength of all 4 extremities. Good symmetric motor tone is noted throughout.  Sensory: Sensory testing is intact to pinprick, soft touch, vibration sensation, and position sense on all 4 extremities, with exception that there is a stocking pattern pinprick sensory deficit up to the knees bilaterally. No evidence of extinction is noted.  Coordination: Cerebellar testing reveals good finger-nose-finger and heel-to-shin bilaterally.  Gait and station: Gait is wide-based, slightly unsteady. The patient uses a walker for ambulation. The patient was unable to perform tandem gait. Romberg is negative. No drift is seen.  Reflexes: Deep tendon reflexes are symmetric, but are depressed bilaterally. Toes are downgoing bilaterally.   Assessment/Plan:  1. Obesity  2. Peripheral neuropathy  3. Sjogren's syndrome  4. Fibromyalgia  The patient will be increased on the gabapentin dosing. The patient has been on a number of other medications in the past with incomplete tolerance or incomplete pain control. The patient will have blood work done today. She will followup in 4 months. The patient uses a walker for ambulation with good stability.  Jill Alexanders MD 02/02/2014 8:22 PM  [facility] [address] [place], [place] [address]  Phone [phone] Fax  [phone]

## 2014-02-02 NOTE — Patient Instructions (Signed)
Peripheral Neuropathy Peripheral neuropathy is a type of nerve damage. It affects nerves that carry signals between the spinal cord and other parts of the body. These are called peripheral nerves. With peripheral neuropathy, one nerve or a group of nerves may be damaged.  CAUSES  Many things can damage peripheral nerves. For some people with peripheral neuropathy, the cause is unknown. Some causes include:  Diabetes. This is the most common cause of peripheral neuropathy.  Injury to a nerve.  Pressure or stress on a nerve that lasts a long time.  Too little vitamin B. Alcoholism can lead to this.  Infections.  Autoimmune diseases, such as multiple sclerosis and systemic lupus erythematosus.  Inherited nerve diseases.  Some medicines, such as cancer drugs.  Toxic substances, such as lead and mercury.  Too little blood flowing to the legs.  Kidney disease.  Thyroid disease. SIGNS AND SYMPTOMS  Different people have different symptoms. The symptoms you have will depend on which of your nerves is damaged. Common symptoms include:  Loss of feeling (numbness) in the feet and hands.  Tingling in the feet and hands.  Pain that burns.  Very sensitive skin.  Weakness.  Not being able to move a part of the body (paralysis).  Muscle twitching.  Clumsiness or poor coordination.  Loss of balance.  Not being able to control your bladder.  Feeling dizzy.  Sexual problems. DIAGNOSIS  Peripheral neuropathy is a symptom, not a disease. Finding the cause of peripheral neuropathy can be hard. To figure that out, your health care Hazaiah Edgecombe will take a medical history and do a physical exam. A neurological exam will also be done. This involves checking things affected by your brain, spinal cord, and nerves (nervous system). For example, your health care Deshay Blumenfeld will check your reflexes, how you move, and what you can feel.  Other types of tests may also be ordered, such as:  Blood  tests.  A test of the fluid in your spinal cord.  Imaging tests, such as CT scans or an MRI.  Electromyography (EMG). This test checks the nerves that control muscles.  Nerve conduction velocity tests. These tests check how fast messages pass through your nerves.  Nerve biopsy. A small piece of nerve is removed. It is then checked under a microscope. TREATMENT   Medicine is often used to treat peripheral neuropathy. Medicines may include:  Pain-relieving medicines. Prescription or over-the-counter medicine may be suggested.  Antiseizure medicine. This may be used for pain.  Antidepressants. These also may help ease pain from neuropathy.  Lidocaine. This is a numbing medicine. You might wear a patch or be given a shot.  Mexiletine. This medicine is typically used to help control irregular heart rhythms.  Surgery. Surgery may be needed to relieve pressure on a nerve or to destroy a nerve that is causing pain.  Physical therapy to help movement.  Assistive devices to help movement. HOME CARE INSTRUCTIONS   Only take over-the-counter or prescription medicines as directed by your health care Skyeler Smola. Follow the instructions carefully for any given medicines. Do not take any other medicines without first getting approval from your health care Geordan Xu.  If you have diabetes, work closely with your health care Tehran Rabenold to keep your blood sugar under control.  If you have numbness in your feet:  Check every day for signs of injury or infection. Watch for redness, warmth, and swelling.  Wear padded socks and comfortable shoes. These help protect your feet.  Do not do   things that put pressure on your damaged nerve.  Do not smoke. Smoking keeps blood from getting to damaged nerves.  Avoid or limit alcohol. Too much alcohol can cause a lack of B vitamins. These vitamins are needed for healthy nerves.  Develop a good support system. Coping with peripheral neuropathy can be  stressful. Talk to a mental health specialist or join a support group if you are struggling.  Follow up with your health care Katlin Bortner as directed. SEEK MEDICAL CARE IF:   You have new signs or symptoms of peripheral neuropathy.  You are struggling emotionally from dealing with peripheral neuropathy.  You have a fever. SEEK IMMEDIATE MEDICAL CARE IF:   You have an injury or infection that is not healing.  You feel very dizzy or begin vomiting.  You have chest pain.  You have trouble breathing. Document Released: 07/21/2002 Document Revised: 04/12/2011 Document Reviewed: 04/07/2013 Exit[employer] Patient Information 2015 [employer]. This information is not intended to replace advice given to you by your health care Kais Monje. Make sure you discuss any questions you have with your health care Orpha Dain.  

## 2014-02-05 LAB — IFE AND PE, SERUM
ALPHA 1: 0.2 g/dL (ref 0.1–0.4)
Albumin SerPl Elph-Mcnc: 3.8 g/dL (ref 3.2–5.6)
Albumin/Glob SerPl: 1.6 (ref 0.7–2.0)
Alpha2 Glob SerPl Elph-Mcnc: 0.7 g/dL (ref 0.4–1.2)
B-Globulin SerPl Elph-Mcnc: 0.8 g/dL (ref 0.6–1.3)
Gamma Glob SerPl Elph-Mcnc: 0.7 g/dL (ref 0.5–1.6)
Globulin, Total: 2.5 g/dL (ref 2.0–4.5)
IGA/IMMUNOGLOBULIN A, SERUM: 237 mg/dL (ref 91–414)
IgG (Immunoglobin G), Serum: 753 mg/dL (ref 700–1600)
IgM (Immunoglobulin M), Srm: 171 mg/dL (ref 40–230)
TOTAL PROTEIN: 6.3 g/dL (ref 6.0–8.5)

## 2014-02-05 LAB — VITAMIN B12

## 2014-02-05 LAB — COPPER, SERUM: Copper: 90 ug/dL (ref 72–166)

## 2014-02-05 LAB — ANGIOTENSIN CONVERTING ENZYME: Angio Convert Enzyme: 29 U/L (ref 14–82)

## 2014-02-06 NOTE — Progress Notes (Signed)
Quick Note:  Called and shared labs with patient thru VM message ______

## 2014-03-18 ENCOUNTER — Other Ambulatory Visit: Payer: Self-pay | Admitting: Cardiology

## 2014-03-31 ENCOUNTER — Other Ambulatory Visit: Payer: Self-pay

## 2014-03-31 MED ORDER — GABAPENTIN 300 MG PO CAPS: ORAL_CAPSULE | ORAL | Status: DC

## 2014-05-08 ENCOUNTER — Ambulatory Visit: Payer: Private Health Insurance - Indemnity | Admitting: *Deleted

## 2014-06-04 ENCOUNTER — Encounter: Payer: Self-pay | Admitting: Adult Health

## 2014-06-04 ENCOUNTER — Ambulatory Visit (INDEPENDENT_AMBULATORY_CARE_PROVIDER_SITE_OTHER): Payer: Private Health Insurance - Indemnity | Admitting: Adult Health

## 2014-06-04 VITALS — BP 126/75 | HR 63 | Ht 61.5 in | Wt 238.0 lb

## 2014-06-04 DIAGNOSIS — M797 Fibromyalgia: Secondary | ICD-10-CM

## 2014-06-04 DIAGNOSIS — G609 Hereditary and idiopathic neuropathy, unspecified: Secondary | ICD-10-CM

## 2014-06-04 MED ORDER — GABAPENTIN 300 MG PO CAPS: ORAL_CAPSULE | ORAL | Status: DC

## 2014-06-04 NOTE — Patient Instructions (Signed)

## 2014-06-04 NOTE — Progress Notes (Signed)
I have read the note, and I agree with the clinical assessment and plan.  WILLIS,CHARLES KEITH   

## 2014-06-04 NOTE — Progress Notes (Signed)
PATIENT: [name] DOB: [birth year]  REASON FOR VISIT: follow up HISTORY FROM: patient  HISTORY OF PRESENT ILLNESS: [name] is a 63 year old female with a history of peripheral neuropathy. She returns today for follow-up. She is currently taking gabapentin 300 mg 1 capsule in the AM and 2 capsules in the PM. She reports that she can tell there was some improvement but she does continue to still have discomfort at night. She feels that the pain- burning and tingling- is usually in the toes she also has numbness. She uses a Corporate investment banker when ambulating. Denies any recent falls She does have some insomnia at night due to bursitis in the right hip and bone spurs on the spine. She will be getting injections in the spine as well as starting a prednisone Dosepak.   HISTORY 02/02/14 (CW): 63 year old right-handed white female with a history of a peripheral neuropathy. The patient has been felt in the past to have diabetes, but her hemoglobin A1c readings have been within normal limits over a number of years. The patient was first felt to have a peripheral neuropathy in 1999, but the symptoms have gradually worsened over the last several years, particularly over the last 6-12 months. She had nerve conduction studies done through this office in 2011 confirming a peripheral neuropathy. She has Sjogren syndrome as well. She indicates that she has some tingling sensations in the feet and up the legs up to the knees, and some tingling in the hands. The symptoms are worse at night, better during the day. She has some chronic low back pain as well. The patient has been on a number of medications in the past that include Lyrica, Cymbalta, and Topamax. She currently is on gabapentin only 400 mg at night. The patient has some gait instability, but she uses a walker for ambulation. She last fell about one year ago. The patient is sent to this office for further evaluation.  REVIEW OF SYSTEMS: Full 14 system review of  systems performed and notable only for:  Constitutional: activity change, fatigue Eyes: N/A Ear/Nose/Throat: runny nose  Skin: N/A  Cardiovascular: palpitations Respiratory: N/A  Gastrointestinal: swollen abdomen, diarrhea, nausea Genitourinary: incontinence of bladder Hematology/Lymphatic: swollen lymph nodes Endocrine: cole intolerance Musculoskeletal: joint pain, joint swelling, back pain, aching muscles, walking difficulty Allergy/Immunology: environmental allergies Neurological: memory loss, numbness, weakness, tremor Psychiatric: N/A Sleep: restless leg, insomnia, daytime sleepiness   ALLERGIES: Allergies  Allergen Reactions  . Demerol Hives and Other (See Comments)    Fever   . Ivp Dye [Iodinated Diagnostic Agents] Hives and Other (See Comments)    Fever   . Morphine And Related Other (See Comments)    ineffective  . Adhesive [Tape] Rash    HOME MEDICATIONS: Outpatient Prescriptions Prior to Visit  Medication Sig Dispense Refill  . cycloSPORINE (RESTASIS) 0.05 % ophthalmic emulsion Place 2 drops into both eyes 2 (two) times daily.       . diphenoxylate-atropine (LOMOTIL) 2.5-0.025 MG per tablet Take 1 tablet by mouth as needed.       [name] SURECLICK 50 MG/ML injection Inject 50 mg into the skin once a week.       . gabapentin (NEURONTIN) 300 MG capsule One capsule in the morning and two capsules in the evening  270 capsule  0  . HYDROcodone-acetaminophen (NORCO/VICODIN) 5-325 MG per tablet Take 1 tablet by mouth every 6 (six) hours as needed for pain.      . hydroxychloroquine (PLAQUENIL) 200  MG tablet Take 200 mg by mouth 2 (two) times daily.      . hyoscyamine (LEVBID) 0.375 MG 12 hr tablet Take 0.375 mg by mouth 2 (two) times daily.       . Lido-Capsaicin-Men-Methyl Sal (MEDI-PATCH-LIDOCAINE) 0.5-0.035-5-20 % PTCH Apply 1 patch topically every 12 (twelve) hours as needed.      . metoprolol succinate (TOPROL-XL) 25 MG 24 hr tablet TAKE 1 TABLET DAILY  90 tablet  0   . Multiple Vit-Min-Calcium-FA (FOLGARD OS PO) Take 1 tablet by mouth 2 (two) times daily.       . pantoprazole (PROTONIX) 40 MG tablet Take 40 mg by mouth daily.      . Probiotic Product (ALIGN) 4 MG CAPS Take 1 capsule by mouth daily.      . Probiotic Product ([name]) 170 MG CAPS Take 1 capsule by mouth every morning.      . promethazine (PHENERGAN) 25 MG tablet Take 25 mg by mouth every 6 (six) hours as needed.        . zolpidem (AMBIEN) 10 MG tablet Take 10 mg by mouth at bedtime as needed.        Facility-Administered Medications Prior to Visit  Medication Dose Route Frequency Provider Last Rate Last Dose  . chlorhexidine (HIBICLENS) 4 % liquid 4 application  60 mL Topical Once Marisa Arabian V, MD      . chlorhexidine (HIBICLENS) 4 % liquid 4 application  60 mL Topical Once Marisa Alf, MD        PAST MEDICAL HISTORY: Past Medical History  Diagnosis Date  . Heart murmur     as a child  . Pneumonia     1980's  . Sleep apnea     States SEVERE- last study  8/12/CPAP- states setting on 12  . Diabetes mellitus     diet controlled  . GERD (gastroesophageal reflux disease)     Barretts esophagitis per pt  . Blood transfusion     1981  . Arthritis     Rheumatoid arthritis,   . Hiatal hernia     sjorgens syndrome  . Fibromyalgia   . Peripheral autonomic neuropathy of unknown cause   . No pertinent past medical history     [name], [name] on chart   . Dysrhythmia     /benign PVC's/ EKG 8/12 on  chart  . Diastolic dysfunction   . Polyneuropathy in diabetes(357.2) 02/02/2014    PAST SURGICAL HISTORY: Past Surgical History  Procedure Laterality Date  . Tonsillectomy    . Knee arthroscopy      x2  . Diagnostic laparoscopy      x3  . Tendon repair  1980    left ankle and tibia  . Abdominal hysterectomy      BTL, BSO  . Cholecystectomy    . Tubal ligation    . Patotid cystectomy    . Total knee arthroplasty  06/21/2011     Procedure: TOTAL KNEE ARTHROPLASTY;  Surgeon: [name];  Location: [facility];  Service: Orthopedics;  Laterality: Right;    FAMILY HISTORY: Family History  Problem Relation Age of Onset  . Alzheimer's disease Mother   . Heart attack Mother   . Hypertension Mother   . Glaucoma Mother   . Cancer Father   . Heart attack Brother   . Heart disease Brother   . Glaucoma Brother   . Hyperlipidemia Brother   . Glaucoma Brother  SOCIAL HISTORY: History   Social History  . Marital Status: Married    Spouse Name: N/A    Number of Children: 2  . Years of Education: AS   Occupational History  . disability   . RN    Social History Main Topics  . Smoking status: Never Smoker   . Smokeless tobacco: Never Used  . Alcohol Use: Yes     Comment: less than 1 month  . Drug Use: No  . Sexual Activity: Not on file   Other Topics Concern  . Not on file   Social History Narrative  . No narrative on file      PHYSICAL EXAM  Filed Vitals:   06/04/14 1334  Weight: 238 lb (107.956 kg)   Body mass index is 44.25 kg/(m^2).  Generalized: Well developed, in no acute distress   Neurological examination  Mentation: Alert oriented to time, place, history taking. Follows all commands speech and language fluent Cranial nerve II-XII: Pupils were equal round reactive to light. Extraocular movements were full, visual field were full on confrontational test. Facial sensation and strength were normal.  Uvula tongue midline. Head turning and shoulder shrug  were normal and symmetric. Motor: The motor testing reveals 5 over 5 strength of all 4 extremities. Good symmetric motor tone is noted throughout.  Sensory: Sensory testing is intact to soft touch on all 4 extremities except decreased to light touch on the right lower extremity. No evidence of extinction is noted.  Coordination: Cerebellar testing reveals good finger-nose-finger and heel-to-shin bilaterally.  Gait and station: Gait is  normal. Tandem gait not attempted. Romberg is negative. No drift is seen.  Reflexes: Deep tendon reflexes are symmetric and normal bilaterally.  [name]   DIAGNOSTIC DATA (LABS, IMAGING, TESTING) - I reviewed patient records, labs, notes, testing and imaging myself where available.  Lab Results  Component Value Date   WBC 8.3 06/24/2011   HGB 11.7* 06/24/2011   HCT 36.0 06/24/2011   MCV 91.4 06/24/2011   PLT 151 06/24/2011      Component Value Date/Time   NA 136 06/23/2011 0441   K 4.2 06/23/2011 0441   CL 101 06/23/2011 0441   CO2 29 06/23/2011 0441   GLUCOSE 131* 06/23/2011 0441   BUN 6 06/23/2011 0441   CREATININE 0.65 06/23/2011 0441   CALCIUM 9.0 06/23/2011 0441   PROT 6.3 02/02/2014 1521   PROT 6.7 06/13/2011 1300   ALBUMIN 3.8 06/13/2011 1300   AST 25 06/13/2011 1300   ALT 24 06/13/2011 1300   ALKPHOS 90 06/13/2011 1300   BILITOT 0.5 06/13/2011 1300   GFRNONAA >90 06/23/2011 0441   GFRAA >90 06/23/2011 0441     Lab Results  Component Value Date   VITAMINB12 >1999* 02/02/2014       ASSESSMENT AND PLAN 63 y.o. year old female  has a past medical history of Heart murmur; Pneumonia; Sleep apnea; Diabetes mellitus; GERD (gastroesophageal reflux disease); Blood transfusion; Arthritis; Hiatal hernia; Fibromyalgia; Peripheral autonomic neuropathy of unknown cause; No pertinent past medical history; Dysrhythmia; Diastolic dysfunction; and Polyneuropathy in diabetes(357.2) (02/02/2014). here with:  1. Peripheral neuropathy 2. Fibromyalgia  Patient continues to have discomfort in the lower extremities at night. She states that the increase in gabapentin helped but she continues to have discomfort affecting her sleep. I will increase gabapentin to 1 capsule during the day and 3 capsules at night. In the future the patient may be tried on nortriptyline if the gabapentin does not control her discomfort.  If her symptoms worsen or she develops new symptoms she should let us know. Otherwise she  will follow-up in 6 months or sooner if needed.   Ward Givens, MSN, NP-C 06/04/2014, 1:38 PM [facility] [address] [place], [place] [address] [phone]  Note: This document was prepared with digital dictation and possible smart phrase technology. Any transcriptional errors that result from this process are unintentional.

## 2014-06-16 ENCOUNTER — Other Ambulatory Visit: Payer: Self-pay | Admitting: Cardiology

## 2014-06-22 ENCOUNTER — Ambulatory Visit: Payer: Private Health Insurance - Indemnity | Admitting: Dietician

## 2014-06-24 ENCOUNTER — Encounter: Payer: Managed Care, Other (non HMO) | Attending: Geriatric Medicine | Admitting: Dietician

## 2014-06-24 ENCOUNTER — Encounter: Payer: Self-pay | Admitting: Dietician

## 2014-06-24 DIAGNOSIS — Z6841 Body Mass Index (BMI) 40.0 and over, adult: Secondary | ICD-10-CM | POA: Diagnosis not present

## 2014-06-24 DIAGNOSIS — Z713 Dietary counseling and surveillance: Secondary | ICD-10-CM | POA: Diagnosis not present

## 2014-06-24 NOTE — Progress Notes (Signed)
Medical Nutrition Therapy:  Appt start time: 1510 end time:  1610.  Assessment:  Primary concerns today: [name] is here today since since she would like to lose weight. Has a lot of mobility issues, peripheral neuropathy, and problems with knees. Dosage of gabapentin has gone up over the last month and weight has been going up (about 4 lbs). Travelled recently to [place]. Her weight now is closest to her highest now. Previously was diagnosed with diabetes but blood sugar has been in the normal range recently. Has some episodes of hypoglycemia.   Lives with her husband. She does most of the meal preparation and they both do the shopping. Buys very low sodium items d/t husband's kidney issues. Will miss about 5-6 meals per week (mostly breakfast) since she is not hungry. Has been eating the same way for awhile. Eats about 2-3 meals per week.  Was doing diet soda in January and was told to avoid artificial sweetener since it irritates her stomach. Drinking regular soda but not as much as she used to drink.  Feels like she needs to "move around more". Stress level is high since her husband's health has not been well (in and out of the hospital since 10/1). Was doing physical therapy for a while and mobility is better than it used to be.   Would like to weigh less than 200 lbs.   Preferred Learning Style:   No preference indicated   Learning Readiness:   Ready  MEDICATIONS: see list   DIETARY INTAKE:  Usual eating pattern includes 2-3 meals and 0-1 snacks per day.  Avoided foods include:  none  24-hr recall:  B ( AM): cereal (Cheerios, Shredded Wheat, Frosted Flakes) with skim milk  or eggs with cheese (sometimes), yogurt, pancakes on vacation with water  and juice or part of a soda Snk ( AM): none L ( PM): egg salad, grilled cheese, hamburger, sub (if out), Lean Cuisine Snk ( PM): none D ( PM): pork chops on the grill, salt free chili, meatloaf and has 2 vegetables with dinner  meals Snk ( PM): popcorn, dessert/frozen bars (not often), sherbert, vanilla wafers Beverages: 24 oz of juice per week, iced tea if she out (sometimes sweet), water, 16+ oz soda per day  Usual physical activity: walks around shopping centers with walker about 2 x week  Estimated energy needs: 1600 calories 180 g carbohydrates 120 g protein 44 g fat  Progress Towards Goal(s):  In progress.   Nutritional Diagnosis:  [place]yx-3.3 Overweight/obesity As related to excess consumption of sugar sweetened beverages and limited mobility.  As evidenced by BMI of 45.1.    Intervention:  Nutrition counseling. Plan: Plan to walk with your walker on the street for about 10 minutes and increase by 5 minutes as you are comfortable. Try to arm/chair exercise each day.  Aim to eat 3 meals per day (don't go more than 5 hours without eating).  Consider checking blood sugar a couple times per week and different times of the day (2 hours after a meal).  Aim to make beverages sugar free (calorie free). Try unsweet hot/iced tea.  Trying having a little bit of juice with seltzer water.  Fill half of your plate with vegetables and limit starches to a quarter of your plate.  Consider turning off the TV while eating dinner meal (to help pay attention to fullness feelings).  Teaching Method Utilized:  Visual Auditory Hands on  Handouts given during visit include:  MyPlate Handout  Barriers to learning/adherence to lifestyle change: limited mobility, stress  Demonstrated degree of understanding via:  Teach Back   Monitoring/Evaluation:  Dietary intake, exercise, and body weight in 4 week(s).

## 2014-06-24 NOTE — Patient Instructions (Addendum)
Plan to walk with your walker on the street for about 10 minutes and increase by 5 minutes as you are comfortable. Try to arm/chair exercise each day.  Aim to eat 3 meals per day (don't go more than 5 hours without eating).  Consider checking blood sugar a couple times per week and different times of the day (2 hours after a meal).  Aim to make beverages sugar free (calorie free). Try unsweet hot/iced tea.  Trying having a little bit of juice with seltzer water.  Fill half of your plate with vegetables and limit starches to a quarter of your plate.  Consider turning off the TV while eating dinner meal (to help pay attention to fullness feelings).

## 2014-06-29 ENCOUNTER — Telehealth: Payer: Self-pay | Admitting: Adult Health

## 2014-06-29 MED ORDER — GABAPENTIN 300 MG PO CAPS: ORAL_CAPSULE | ORAL | Status: DC

## 2014-06-29 NOTE — Telephone Encounter (Signed)
I called the patient. She continues to have burning and tingling pain in her feet especially her toes at night. She states she tries to go to bed between 8 and 9 PM and sometimes the pain is so bad that it keeps her up. I will increase her gabapentin to 1 capsule in the AM and 4 capsules in the p.m. The patient verbalized understanding.

## 2014-06-29 NOTE — Telephone Encounter (Signed)
Patient calling to state that she is still having difficulties with her neuropathy in her feet at night, especially in the toes and her feet and wants to know if her medication can be increased. Please return call and advise.

## 2014-07-01 ENCOUNTER — Encounter: Payer: Self-pay | Admitting: Neurology

## 2014-07-07 ENCOUNTER — Encounter: Payer: Self-pay | Admitting: Neurology

## 2014-07-28 ENCOUNTER — Encounter: Payer: Managed Care, Other (non HMO) | Attending: Geriatric Medicine | Admitting: Dietician

## 2014-07-28 DIAGNOSIS — Z713 Dietary counseling and surveillance: Secondary | ICD-10-CM | POA: Diagnosis not present

## 2014-07-28 DIAGNOSIS — Z6841 Body Mass Index (BMI) 40.0 and over, adult: Secondary | ICD-10-CM | POA: Diagnosis not present

## 2014-07-28 NOTE — Patient Instructions (Addendum)
Plan to walk with your walker on the street for about 10 minutes and increase by 5 minutes as you are comfortable. Try to arm/chair exercise each day.  Aim to eat 3 meals per day (don't go more than 5 hours without eating).  Have protein with carbs for snacks/small meals (fruit with nuts, cheese, peanut butter). [place] Protein bars for a snack.  Try a Hydrographic surveyor for a meal (when you are not very hungry).  Aim to make beverages sugar free (calorie free). Try unsweet hot/iced tea.  Trying having a bit seltzer water.  Consider turning off the TV while eating dinner meal (to help pay attention to fullness feelings).

## 2014-07-28 NOTE — Progress Notes (Signed)
  Medical Nutrition Therapy:  Appt start time: 1410 end time:  1430.  Assessment:  Primary concerns today: Pickerill returns today with a 5 lbs weight gain. Has a lot of fluid on her legs. Has been having problems with her health this past month.  Feels like she might not eat enough food. Not feeling very hungry. Drinking less soda. Has not been able to walk much d/t pain.   Will use her husband's meter sometimes and averaging between 90-124 mg/dl. Had one low blood sugar in the past month when she did not eat.    Preferred Learning Style:   No preference indicated   Learning Readiness:   Ready  MEDICATIONS: see list   DIETARY INTAKE:  Usual eating pattern includes 2-3 meals and 0-1 snacks per day.  Avoided foods include:  none  24-hr recall:  B ( AM): cereal (Cheerios, Shredded Wheat, Frosted Flakes) with skim milk  or eggs with cheese (sometimes), yogurt, pancakes on vacation with water  and juice or part of a soda Snk ( AM): none L ( PM): egg salad, grilled cheese, hamburger, sub (if out), Lean Cuisine Snk ( PM): none D ( PM): pork chops on the grill, salt free chili, meatloaf and has 2 vegetables with dinner meals Snk ( PM): popcorn, dessert/frozen bars (not often), sherbert, vanilla wafers Beverages:15 calorie lemonade,  24 oz of juice per week, iced tea if she out (sometimes sweet), water, 10-12 oz soda per day  Usual physical activity: no regular exercise, though will do some chair exercises  Estimated energy needs: 1600 calories 180 g carbohydrates 120 g protein 44 g fat  Progress Towards Goal(s):  In progress.   Nutritional Diagnosis:  [place]ard-3.3 Overweight/obesity As related to excess consumption of sugar sweetened beverages and limited mobility.  As evidenced by BMI of 45.1.    Intervention:  Nutrition counseling. Plan: Plan to walk with your walker on the street for about 10 minutes and increase by 5 minutes as you are comfortable. Try to arm/chair exercise each  day.  Aim to eat 3 meals per day (don't go more than 5 hours without eating).  Have protein with carbs for snacks/small meals (fruit with nuts, cheese, peanut butter). [place] Protein bars for a snack.  Try a Hydrographic surveyor for a meal (when you are not very hungry).  Aim to make beverages sugar free (calorie free). Try unsweet hot/iced tea.  Trying having a bit seltzer water.  Consider turning off the TV while eating dinner meal (to help pay attention to fullness feelings).  Teaching Method Utilized:  Visual Auditory Hands on  Handouts given during visit include:  MyPlate Handout  Barriers to learning/adherence to lifestyle change: limited mobility, stress  Demonstrated degree of understanding via:  Teach Back   Monitoring/Evaluation:  Dietary intake, exercise, and body weight in 6 week(s).

## 2014-07-29 ENCOUNTER — Ambulatory Visit (HOSPITAL_COMMUNITY)
Admission: RE | Admit: 2014-07-29 | Discharge: 2014-07-29 | Disposition: A | Discharge status: Home / Self Care | Payer: Managed Care, Other (non HMO) | Source: Ambulatory Visit | Attending: Geriatric Medicine | Admitting: Geriatric Medicine

## 2014-07-29 ENCOUNTER — Other Ambulatory Visit (HOSPITAL_COMMUNITY): Payer: Self-pay | Admitting: Geriatric Medicine

## 2014-07-29 DIAGNOSIS — R600 Localized edema: Secondary | ICD-10-CM | POA: Insufficient documentation

## 2014-07-29 DIAGNOSIS — R609 Edema, unspecified: Secondary | ICD-10-CM

## 2014-07-29 DIAGNOSIS — M79609 Pain in unspecified limb: Secondary | ICD-10-CM

## 2014-07-29 NOTE — Progress Notes (Signed)
VASCULAR LAB PRELIMINARY  PRELIMINARY  PRELIMINARY  PRELIMINARY  Right lower extremity venous duplex completed.    Preliminary report:  Right:  No evidence of DVT, superficial thrombosis, or Baker's cyst.  Osmin Welz, RVS 07/29/2014, 4:07 PM

## 2014-08-11 ENCOUNTER — Encounter: Payer: Self-pay | Admitting: Nurse Practitioner

## 2014-08-11 ENCOUNTER — Ambulatory Visit (INDEPENDENT_AMBULATORY_CARE_PROVIDER_SITE_OTHER): Payer: Managed Care, Other (non HMO) | Admitting: Nurse Practitioner

## 2014-08-11 VITALS — BP 108/80 | HR 44 | Ht 61.5 in | Wt 244.0 lb

## 2014-08-11 DIAGNOSIS — R0600 Dyspnea, unspecified: Secondary | ICD-10-CM

## 2014-08-11 DIAGNOSIS — G4733 Obstructive sleep apnea (adult) (pediatric): Secondary | ICD-10-CM

## 2014-08-11 DIAGNOSIS — I493 Ventricular premature depolarization: Secondary | ICD-10-CM

## 2014-08-11 DIAGNOSIS — R609 Edema, unspecified: Secondary | ICD-10-CM

## 2014-08-11 LAB — TSH: TSH: 1.23 u[IU]/mL (ref 0.35–4.50)

## 2014-08-11 LAB — BASIC METABOLIC PANEL
BUN: 16 mg/dL (ref 6–23)
CO2: 28 mEq/L (ref 19–32)
Calcium: 9.2 mg/dL (ref 8.4–10.5)
Chloride: 106 mEq/L (ref 96–112)
Creatinine, Ser: 0.8 mg/dL (ref 0.4–1.2)
GFR: 77.21 mL/min (ref >60.00)
Glucose, Bld: 96 mg/dL (ref 70–99)
Potassium: 4.1 mEq/L (ref 3.5–5.1)
Sodium: 141 mEq/L (ref 135–145)

## 2014-08-11 LAB — CBC
HCT: 42.5 % (ref 36.0–46.0)
Hemoglobin: 14.1 g/dL (ref 12.0–15.0)
MCHC: 33.2 g/dL (ref 30.0–36.0)
MCV: 87.1 fl (ref 78.0–100.0)
Platelets: 181 10*3/uL (ref 150.0–400.0)
RBC: 4.88 Mil/uL (ref 3.87–5.11)
RDW: 12.8 % (ref 11.5–15.5)
WBC: 5.4 10*3/uL (ref 4.0–10.5)

## 2014-08-11 LAB — HEPATIC FUNCTION PANEL
ALT: 24 U/L (ref 0–35)
AST: 24 U/L (ref 0–37)
Albumin: 3.9 g/dL (ref 3.5–5.2)
Alkaline Phosphatase: 65 U/L (ref 39–117)
Bilirubin, Direct: 0.1 mg/dL (ref 0.0–0.3)
Total Bilirubin: 0.6 mg/dL (ref 0.2–1.2)
Total Protein: 6.9 g/dL (ref 6.0–8.3)

## 2014-08-11 LAB — BRAIN NATRIURETIC PEPTIDE: Pro B Natriuretic peptide (BNP): 105 pg/mL — ABNORMAL HIGH (ref 0.0–100.0)

## 2014-08-11 MED ORDER — FUROSEMIDE 20 MG PO TABS: 20.0000 mg | ORAL_TABLET | Freq: Every day | ORAL | Status: DC

## 2014-08-11 NOTE — Progress Notes (Signed)
[name] Date of Birth: 04-Sep-1950 Medical Record [id number]  History of Present Illness: [name] is seen back today for a work in visit. Seen for Dr. Radford Pax. She is a 63 y.o. female with a history of PVC's, obesity and OSA. Remote echo in 2012 noted. Mild LVH with normal EF and grade II diastolic dysfunction.   Her other issues include fibromyalgia, neuropathy, chronic pain & NIDDM.   She was last seen here in April - felt to be doing ok.  Comes back today. Here with her husband, [name] - I have seen him in the past as well. She says she is referring herself back here due to swelling. She notes that for the past 2 weeks she has had more swelling in her legs and even in her abdomen. She went to see her PCP - saw the NP - would not give her diuretic due to low BP. Probably gets too much salt. Has RX for support stockings but has not gotten yet. Very sedentary. Morbidly obese. No chest pain. May have some shortness of breath at times. Has chronic PVC's - no syncope reported.   Current Outpatient Prescriptions  Medication Sig Dispense Refill  . cycloSPORINE (RESTASIS) 0.05 % ophthalmic emulsion Place 2 drops into both eyes 2 (two) times daily.     . diphenoxylate-atropine (LOMOTIL) 2.5-0.025 MG per tablet Take 1 tablet by mouth as needed.     [name] SURECLICK 50 MG/ML injection Inject 50 mg into the skin once a week.     . gabapentin (NEURONTIN) 300 MG capsule One capsule in the morning and four capsules in the evening 450 capsule 3  . HYDROcodone-acetaminophen (NORCO/VICODIN) 5-325 MG per tablet Take 1 tablet by mouth every 6 (six) hours as needed for pain.    . hydroxychloroquine (PLAQUENIL) 200 MG tablet Take 200 mg by mouth 2 (two) times daily. 200 in the am 100 in the pm    . hyoscyamine (LEVBID) 0.375 MG 12 hr tablet Take 0.375 mg by mouth 2 (two) times daily.     . Lido-Capsaicin-Men-Methyl Sal (MEDI-PATCH-LIDOCAINE) 0.5-0.035-5-20 % PTCH Apply 1 patch topically every 12  (twelve) hours as needed.    . metoprolol succinate (TOPROL-XL) 25 MG 24 hr tablet TAKE 1 TABLET DAILY 90 tablet 0  . Multiple Vit-Min-Calcium-FA (FOLGARD OS PO) Take 1 tablet by mouth 2 (two) times daily.     . pantoprazole (PROTONIX) 40 MG tablet Take 40 mg by mouth daily.    . Probiotic Product (ALIGN) 4 MG CAPS Take 1 capsule by mouth daily.    . Probiotic Product ([place]) 170 MG CAPS Take 1 capsule by mouth every morning.    . promethazine (PHENERGAN) 25 MG tablet Take 25 mg by mouth every 6 (six) hours as needed.      [name] XIFAXAN 550 MG TABS tablet Take 550 mg by mouth 3 (three) times daily.      No current facility-administered medications for this visit.   Facility-Administered Medications Ordered in Other Visits  Medication Dose Route Frequency Provider Last Rate Last Dose  . chlorhexidine (HIBICLENS) 4 % liquid 4 application  60 mL Topical Once Gaynelle Arabian V, MD      . chlorhexidine (HIBICLENS) 4 % liquid 4 application  60 mL Topical Once Gearlean Alf, MD        Allergies  Allergen Reactions  . Demerol Hives and Other (See Comments)    Fever   . Ivp Dye [Iodinated Diagnostic Agents]  Hives and Other (See Comments)    Fever   . Morphine And Related Other (See Comments)    ineffective  . Adhesive [Tape] Rash    Past Medical History  Diagnosis Date  . Heart murmur     as a child  . Pneumonia     1980's  . Sleep apnea     States SEVERE- last study  8/12/CPAP- states setting on 12  . Diabetes mellitus     diet controlled  . GERD (gastroesophageal reflux disease)     Barretts esophagitis per pt  . Blood transfusion     1981  . Arthritis     Rheumatoid arthritis,   . Hiatal hernia     sjorgens syndrome  . Fibromyalgia   . Peripheral autonomic neuropathy of unknown cause   . No pertinent past medical history     [facility] note Dr s.Stoneking, Turner on chart   . Dysrhythmia     /benign PVC's/ EKG 8/12 on  chart  . Diastolic dysfunction   .  Polyneuropathy in diabetes(357.2) 02/02/2014    Past Surgical History  Procedure Laterality Date  . Tonsillectomy    . Knee arthroscopy      x2  . Diagnostic laparoscopy      x3  . Tendon repair  1980    left ankle and tibia  . Abdominal hysterectomy      BTL, BSO  . Cholecystectomy    . Tubal ligation    . Patotid cystectomy    . Total knee arthroplasty  06/21/2011    Procedure: TOTAL KNEE ARTHROPLASTY;  Surgeon: Gearlean Alf;  Location: [facility];  Service: Orthopedics;  Laterality: Right;    History  Smoking status  . Never Smoker   Smokeless tobacco  . Never Used    History  Alcohol Use  . Yes    Comment: less than 1 month    Family History  Problem Relation Age of Onset  . Alzheimer's disease Mother   . Heart attack Mother   . Hypertension Mother   . Glaucoma Mother   . Cancer Father   . Heart attack Brother   . Heart disease Brother   . Glaucoma Brother   . Hyperlipidemia Brother   . Glaucoma Brother     Review of Systems: The review of systems is per the HPI.  All other systems were reviewed and are negative.  Physical Exam: BP 108/80 mmHg  Pulse 44  Ht 5' 1.5" (1.562 m)  Wt 244 lb (110.678 kg)  BMI 45.36 kg/m2  SpO2 95%  BP was 120/80 by me. Patient is very pleasant and in no acute distress. She is morbidly obese. Skin is warm and dry. Color is normal.  HEENT is unremarkable. Normocephalic/atraumatic. PERRL. Sclera are nonicteric. Neck is supple. No masses. No JVD. Lungs are clear. Cardiac exam shows a regular rate and rhythm. Frequent ectopics.  Abdomen is soft. Extremities are without edema. Gait and ROM are intact. No gross neurologic deficits noted.  Wt Readings from Last 3 Encounters:  08/11/14 244 lb (110.678 kg)  07/28/14 247 lb 11.2 oz (112.356 kg)  06/24/14 242 lb 9.6 oz (110.043 kg)    LABORATORY DATA/PROCEDURES:  EKG today with NSR with PVCs - rate 68.   Lab Results  Component Value Date   WBC 8.3 06/24/2011   HGB 11.7*  06/24/2011   HCT 36.0 06/24/2011   PLT 151 06/24/2011   GLUCOSE 131* 06/23/2011   ALT 24 06/13/2011  AST 25 06/13/2011   NA 136 06/23/2011   K 4.2 06/23/2011   CL 101 06/23/2011   CREATININE 0.65 06/23/2011   BUN 6 06/23/2011   CO2 29 06/23/2011   INR 0.96 06/13/2011    BNP (last 3 results) No results for input(s): PROBNP in the last 8760 hours.   Assessment / Plan:  1. PVCs - this is chronic - may need assessment of PVC burden - will defer to follow up visit. Will see what her echo looks like. I have left her on her beta blocker.   2. Swelling - most likely from too much salt and her obesity - checking labs today. Adding low dose Lasix 20 mg daily for a week and then to take just prn. Needs to really restrict her salt. Stop her soda. Will get her echo updated.   3. Morbid obesity -  This is the crux of her issues. I do not see this changing unfortunately.   4. OSA - on CPAP  Will check labs, update her echo. Needs to get the support stockings, restrict salt. Low dose diuretic. Will get her back to see Dr. Radford Pax in 4 to 6 weeks.   Patient is agreeable to this plan and will call if any problems develop in the interim.   Burtis Junes, RN, [facility] [address] [place], [place]  [address] [phone]

## 2014-08-11 NOTE — Patient Instructions (Addendum)
We will be checking the following labs today - BNP, BMET, CBC, TSH, HPF  We will get an echocardiogram  Stay on your current medicines but I am adding Lasix 20 mg a day - to take daily for one week and then only as needed.  See Dr. Radford Pax in 6 weeks  Limit your salt  Stop the soda  Wear the support stockings - go to [facility] to order/pick up  Call the [place] office at [phone] if you have any questions, problems or concerns.

## 2014-08-12 ENCOUNTER — Ambulatory Visit (HOSPITAL_COMMUNITY): Payer: Managed Care, Other (non HMO) | Attending: Cardiology | Admitting: Cardiology

## 2014-08-12 DIAGNOSIS — R609 Edema, unspecified: Secondary | ICD-10-CM

## 2014-08-12 DIAGNOSIS — E119 Type 2 diabetes mellitus without complications: Secondary | ICD-10-CM | POA: Insufficient documentation

## 2014-08-12 DIAGNOSIS — R0600 Dyspnea, unspecified: Secondary | ICD-10-CM

## 2014-08-12 DIAGNOSIS — G4733 Obstructive sleep apnea (adult) (pediatric): Secondary | ICD-10-CM

## 2014-08-12 DIAGNOSIS — I493 Ventricular premature depolarization: Secondary | ICD-10-CM

## 2014-08-12 NOTE — Progress Notes (Signed)
Echo performed. 

## 2014-08-26 ENCOUNTER — Other Ambulatory Visit: Payer: Self-pay | Admitting: Cardiology

## 2014-08-27 ENCOUNTER — Telehealth: Payer: Self-pay | Admitting: Adult Health

## 2014-08-27 MED ORDER — GABAPENTIN 300 MG PO CAPS: ORAL_CAPSULE | ORAL | Status: DC

## 2014-08-27 NOTE — Telephone Encounter (Signed)
Rx has been sent.  I called back, got no answer.

## 2014-08-27 NOTE — Telephone Encounter (Signed)
Patient requesting 90 day supply of Rx gabapentin (NEURONTIN) 300 MG capsule with new instructions to reflect 300 mg in am and 1200 in pm, forwarded to [facility].  Please call and advise.

## 2014-09-09 ENCOUNTER — Ambulatory Visit: Payer: Private Health Insurance - Indemnity | Admitting: Dietician

## 2014-09-16 ENCOUNTER — Ambulatory Visit: Payer: Managed Care, Other (non HMO) | Admitting: Cardiology

## 2014-09-28 ENCOUNTER — Encounter: Payer: Self-pay | Admitting: Cardiology

## 2014-09-28 NOTE — Progress Notes (Signed)
Cardiology Office Note   Date:  09/29/2014   ID:  [name], DOB Dec 26, 1950, MRN [id number]  PCP:  Mathews Argyle, MD  Cardiologist:   Sueanne Margarita, MD   Chief Complaint  Patient presents with  . Sleep Apnea  . Obesity      History of Present Illness: [name] is a 64 y.o. female with a history of PVC's, obesity and OSA who presents today for followup. She is doing well. She denies any palpitations. She tolerates her CPAP without any problems. She feels rested in the am and has no daytime sleepiness. She tolerates the nasal mask with chin strap and feels the pressure is adequate. She has not used it in a week because her husband is in the hospital.   She denies any chest pain, SOB, dizziness or syncope. She has chronic PVC's with she feels but they do not bother her.  She saw my NP back in December for some LE edema felt secondary to dietary indiscretion with sodium.  Lasix was added PRN.  Her LE edema has improved with compression stockings.  She is very stressed due to her husband's recent CVA and is still in [facility]       Past Medical History  Diagnosis Date  . Heart murmur     as a child  . Pneumonia     1980's  . Sleep apnea     States SEVERE-on CPAP at 12cm H2O  . Diabetes mellitus     diet controlled  . GERD (gastroesophageal reflux disease)     Barretts esophagitis per pt  . Blood transfusion     1981  . Arthritis     Rheumatoid arthritis,   . Hiatal hernia     sjorgens syndrome  . Fibromyalgia   . Peripheral autonomic neuropathy of unknown cause   . No pertinent past medical history     [place] note Dr s.Stoneking, Turner on chart   . Dysrhythmia     /benign PVC's/ EKG 8/12 on  chart  . Diastolic dysfunction   . Polyneuropathy in diabetes(357.2) 02/02/2014    Past Surgical History  Procedure Laterality Date  . Tonsillectomy    . Knee arthroscopy      x2  . Diagnostic laparoscopy      x3  . Tendon repair  1980    left  ankle and tibia  . Abdominal hysterectomy      BTL, BSO  . Cholecystectomy    . Tubal ligation    . Patotid cystectomy    . Total knee arthroplasty  06/21/2011    Procedure: TOTAL KNEE ARTHROPLASTY;  Surgeon: Gearlean Alf;  Location: [facility];  Service: Orthopedics;  Laterality: Right;     Current Outpatient Prescriptions  Medication Sig Dispense Refill  . cycloSPORINE (RESTASIS) 0.05 % ophthalmic emulsion Place 2 drops into both eyes 2 (two) times daily.     . diphenoxylate-atropine (LOMOTIL) 2.5-0.025 MG per tablet Take 1 tablet by mouth as needed.     [name] SURECLICK 50 MG/ML injection Inject 50 mg into the skin once a week.     . furosemide (LASIX) 20 MG tablet Take 1 tablet (20 mg total) by mouth daily. 30 tablet 3  . gabapentin (NEURONTIN) 300 MG capsule One capsule in the morning and four capsules in the evening 450 capsule 3  . HYDROcodone-acetaminophen (NORCO/VICODIN) 5-325 MG per tablet Take 1 tablet by mouth every 6 (six) hours as needed for pain.    [name]  hydroxychloroquine (PLAQUENIL) 200 MG tablet Take 200 mg by mouth 2 (two) times daily. 200 in the am 100 in the pm    . hyoscyamine (LEVBID) 0.375 MG 12 hr tablet Take 0.375 mg by mouth 2 (two) times daily.     . Lido-Capsaicin-Men-Methyl Sal (MEDI-PATCH-LIDOCAINE) 0.5-0.035-5-20 % PTCH Apply 1 patch topically every 12 (twelve) hours as needed.    . metoprolol succinate (TOPROL-XL) 25 MG 24 hr tablet TAKE 1 TABLET DAILY (PATIENT NEEDS AN APPOINTMENT) 90 tablet 1  . Multiple Vit-Min-Calcium-FA (FOLGARD OS PO) Take 1 tablet by mouth 2 (two) times daily.     . pantoprazole (PROTONIX) 40 MG tablet Take 40 mg by mouth daily.    . Probiotic Product (ALIGN) 4 MG CAPS Take 1 capsule by mouth daily.    . Probiotic Product ([place]) 170 MG CAPS Take 1 capsule by mouth every morning.    . promethazine (PHENERGAN) 25 MG tablet Take 25 mg by mouth every 6 (six) hours as needed.      [name] XIFAXAN 550 MG TABS tablet Take 550 mg  by mouth 3 (three) times daily.      No current facility-administered medications for this visit.   Facility-Administered Medications Ordered in Other Visits  Medication Dose Route Frequency Provider Last Rate Last Dose  . chlorhexidine (HIBICLENS) 4 % liquid 4 application  60 mL Topical Once Gaynelle Arabian V, MD      . chlorhexidine (HIBICLENS) 4 % liquid 4 application  60 mL Topical Once Gearlean Alf, MD        Allergies:   Demerol; Ivp dye; Morphine and related; and Adhesive    Social History:  The patient  reports that she has never smoked. She has never used smokeless tobacco. She reports that she drinks alcohol. She reports that she does not use illicit drugs.   Family History:  The patient's family history includes Alzheimer's disease in her mother; Cancer in her father; Glaucoma in her brother, brother, and mother; Heart attack in her brother and mother; Heart disease in her brother; Hyperlipidemia in her brother; Hypertension in her mother.    ROS:  Please see the history of present illness.   Otherwise, review of systems are positive for none.   All other systems are reviewed and negative.    PHYSICAL EXAM: VS:  BP 102/72 mmHg  Pulse 69  Ht 5' 1.5" (1.562 m)  Wt 240 lb (108.863 kg)  BMI 44.62 kg/m2 , BMI Body mass index is 44.62 kg/(m^2). GEN: Well nourished, well developed, in no acute distress HEENT: normal Neck: no JVD, carotid bruits, or masses Cardiac: RRR; no murmurs, rubs, or gallops,no edema  Respiratory:  clear to auscultation bilaterally, normal work of breathing GI: soft, nontender, nondistended, + BS MS: no deformity or atrophy Skin: warm and dry, no rash Neuro:  Strength and sensation are intact Psych: euthymic mood, full affect   EKG:  EKG is not ordered today.    Recent Labs: 08/11/2014: ALT 24; BUN 16; Creatinine 0.8; Hemoglobin 14.1; Platelets 181.0; Potassium 4.1; Pro B Natriuretic peptide (BNP) 105.0*; Sodium 141; TSH 1.23    Lipid  Panel No results found for: CHOL, TRIG, HDL, CHOLHDL, VLDL, LDLCALC, LDLDIRECT    Wt Readings from Last 3 Encounters:  09/29/14 240 lb (108.863 kg)  08/11/14 244 lb (110.678 kg)  07/28/14 247 lb 11.2 oz (112.356 kg)        ASSESSMENT AND PLAN:  1. PVC's - tolerates well - continue metoprolol  2. OSA on CPAP and tolerating well - I will get a download from her DME 3. Obesity - Her exercise is limited by her peripheral neuropathy and back pain 4. LE edema - improved on PRN Lasix and compression hose  Current medicines are reviewed at length with the patient today.  The patient does not have concerns regarding medicines.  The following changes have been made:  no change  Labs/ tests ordered today include: None  No orders of the defined types were placed in this encounter.     Disposition:   FU with me in 6 months   Signed, Sueanne Margarita, MD  09/29/2014 2:13 PM    [facility] [place], [place], [place]  [address] Phone: [phone]; Fax: [phone]

## 2014-09-29 ENCOUNTER — Ambulatory Visit: Payer: Managed Care, Other (non HMO) | Admitting: Dietician

## 2014-09-29 ENCOUNTER — Ambulatory Visit (INDEPENDENT_AMBULATORY_CARE_PROVIDER_SITE_OTHER): Payer: Managed Care, Other (non HMO) | Admitting: Cardiology

## 2014-09-29 ENCOUNTER — Encounter: Payer: Self-pay | Admitting: Cardiology

## 2014-09-29 VITALS — BP 102/72 | HR 69 | Ht 61.5 in | Wt 240.0 lb

## 2014-09-29 DIAGNOSIS — I493 Ventricular premature depolarization: Secondary | ICD-10-CM

## 2014-09-29 DIAGNOSIS — G4733 Obstructive sleep apnea (adult) (pediatric): Secondary | ICD-10-CM

## 2014-09-29 NOTE — Patient Instructions (Signed)
Your physician recommends that you continue on your current medications as directed. Please refer to the Current Medication list given to you today.  Your physician wants you to follow-up in: 6 months with Dr Turner You will receive a reminder letter in the mail two months in advance. If you don't receive a letter, please call our office to schedule the follow-up appointment.  

## 2014-10-28 ENCOUNTER — Ambulatory Visit (INDEPENDENT_AMBULATORY_CARE_PROVIDER_SITE_OTHER): Payer: Private Health Insurance - Indemnity | Admitting: Emergency Medicine

## 2014-10-28 VITALS — BP 125/78 | HR 67 | Temp 98.5°F | Resp 16 | Ht 61.0 in | Wt 245.6 lb

## 2014-10-28 DIAGNOSIS — J40 Bronchitis, not specified as acute or chronic: Secondary | ICD-10-CM

## 2014-10-28 DIAGNOSIS — J209 Acute bronchitis, unspecified: Secondary | ICD-10-CM

## 2014-10-28 MED ORDER — CLARITHROMYCIN 500 MG PO TABS: 500.0000 mg | ORAL_TABLET | Freq: Two times a day (BID) | ORAL | Status: DC

## 2014-10-28 MED ORDER — ALBUTEROL SULFATE (2.5 MG/3ML) 0.083% IN NEBU
5.0000 mg | INHALATION_SOLUTION | Freq: Once | RESPIRATORY_TRACT | Status: AC
Start: 2014-10-28 — End: 2014-10-28
  Administered 2014-10-28: 5 mg via RESPIRATORY_TRACT

## 2014-10-28 MED ORDER — ALBUTEROL SULFATE HFA 108 (90 BASE) MCG/ACT IN AERS: 2.0000 | INHALATION_SPRAY | RESPIRATORY_TRACT | Status: DC | PRN

## 2014-10-28 MED ORDER — IPRATROPIUM BROMIDE 0.02 % IN SOLN
0.5000 mg | Freq: Once | RESPIRATORY_TRACT | Status: AC
  Administered 2014-10-28: 0.5 mg via RESPIRATORY_TRACT

## 2014-10-28 MED ORDER — HYDROCOD POLST-CHLORPHEN POLST 10-8 MG/5ML PO LQCR: 5.0000 mL | Freq: Two times a day (BID) | ORAL | Status: DC | PRN

## 2014-10-28 NOTE — Progress Notes (Signed)
[facility] [address], [place] [address] [phone]  Date:  10/28/2014   Name:  [name]   DOB:  [birth year]   MRN:  [id number]  PCP:  Mathews Argyle, MD    Chief Complaint: Shortness of Breath and Cough   History of Present Illness:  [name] is a 64 y.o. very pleasant female patient who presents with the following:  Ill since Friday with progressive cough, wheezing and shortness of breath Some nasal congestion and drainage. Productive purulent sputum Fever at home No chills No nausea or vomiting No stool change No improvement with over the counter medications or other home remedies.  Denies other complaint or health concern today.   Patient Active Problem List   Diagnosis Date Noted  . Diabetic polyneuropathy 02/02/2014  . Morbid obesity 11/17/2013  . Diastolic dysfunction   . Heart murmur   . OSA (obstructive sleep apnea)   . Arthritis   . Hiatal hernia   . Fibromyalgia   . Peripheral autonomic neuropathy of unknown cause   . PVC's (premature ventricular contractions)   . 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 Osteoarthritis of right knee 06/22/2011    Past Medical History  Diagnosis Date  . Heart murmur     as a child  . Pneumonia     1980's  . Sleep apnea     States SEVERE-on CPAP at 12cm H2O  . Diabetes mellitus     diet controlled  . GERD (gastroesophageal reflux disease)     Barretts esophagitis per pt  . Blood transfusion     1981  . Arthritis     Rheumatoid arthritis,   . Hiatal hernia     sjorgens syndrome  . Fibromyalgia   . Peripheral autonomic neuropathy of unknown cause   . No pertinent past medical history     [place] note Dr s.Stoneking, Turner on chart   . Dysrhythmia     /benign PVC's/ EKG 8/12 on  chart  . Diastolic dysfunction   . Polyneuropathy in diabetes(357.2) 02/02/2014    Past Surgical History  Procedure Laterality Date  . Tonsillectomy    . Knee arthroscopy       x2  . Diagnostic laparoscopy      x3  . Tendon repair  1980    left ankle and tibia  . Abdominal hysterectomy      BTL, BSO  . Cholecystectomy    . Tubal ligation    . Patotid cystectomy    . Total knee arthroplasty  06/21/2011    Procedure: TOTAL KNEE ARTHROPLASTY;  Surgeon: Gearlean Alf;  Location: [facility];  Service: Orthopedics;  Laterality: Right;    History  Substance Use Topics  . Smoking status: Never Smoker   . Smokeless tobacco: Never Used  . Alcohol Use: Yes     Comment: less than 1 month    Family History  Problem Relation Age of Onset  . Alzheimer's disease Mother   . Heart attack Mother   . Hypertension Mother   . Glaucoma Mother   . Cancer Father   . Heart attack Brother   . Heart disease Brother   . Glaucoma Brother   . Hyperlipidemia Brother   . Glaucoma Brother     Allergies  Allergen Reactions  . Demerol Hives and Other (See Comments)    Fever   . Ivp Dye [Iodinated Diagnostic Agents] Hives and Other (See Comments)  Fever   . Morphine And Related Other (See Comments)    ineffective  . Adhesive [Tape] Rash    Medication list has been reviewed and updated.  Current Outpatient Prescriptions on File Prior to Visit  Medication Sig Dispense Refill  . cycloSPORINE (RESTASIS) 0.05 % ophthalmic emulsion Place 2 drops into both eyes 2 (two) times daily.     . diphenoxylate-atropine (LOMOTIL) 2.5-0.025 MG per tablet Take 1 tablet by mouth as needed.     [name] SURECLICK 50 MG/ML injection Inject 50 mg into the skin once a week.     . furosemide (LASIX) 20 MG tablet Take 1 tablet (20 mg total) by mouth daily. 30 tablet 3  . gabapentin (NEURONTIN) 300 MG capsule One capsule in the morning and four capsules in the evening 450 capsule 3  . HYDROcodone-acetaminophen (NORCO/VICODIN) 5-325 MG per tablet Take 1 tablet by mouth every 6 (six) hours as needed for pain.    . hydroxychloroquine (PLAQUENIL) 200 MG tablet Take 200 mg by mouth 2 (two) times  daily. 200 in the am 100 in the pm    . hyoscyamine (LEVBID) 0.375 MG 12 hr tablet Take 0.375 mg by mouth 2 (two) times daily.     . Lido-Capsaicin-Men-Methyl Sal (MEDI-PATCH-LIDOCAINE) 0.5-0.035-5-20 % PTCH Apply 1 patch topically every 12 (twelve) hours as needed.    . metoprolol succinate (TOPROL-XL) 25 MG 24 hr tablet TAKE 1 TABLET DAILY (PATIENT NEEDS AN APPOINTMENT) 90 tablet 1  . Multiple Vit-Min-Calcium-FA (FOLGARD OS PO) Take 1 tablet by mouth 2 (two) times daily.     . pantoprazole (PROTONIX) 40 MG tablet Take 40 mg by mouth daily.    . Probiotic Product (ALIGN) 4 MG CAPS Take 1 capsule by mouth daily.    . promethazine (PHENERGAN) 25 MG tablet Take 25 mg by mouth every 6 (six) hours as needed.      . Probiotic Product ([place]) 170 MG CAPS Take 1 capsule by mouth every morning.    [name] XIFAXAN 550 MG TABS tablet Take 550 mg by mouth 3 (three) times daily.      Current Facility-Administered Medications on File Prior to Visit  Medication Dose Route Frequency Provider Last Rate Last Dose  . chlorhexidine (HIBICLENS) 4 % liquid 4 application  60 mL Topical Once Gaynelle Arabian, MD      . chlorhexidine (HIBICLENS) 4 % liquid 4 application  60 mL Topical Once Gaynelle Arabian, MD        Review of Systems:  As per HPI, otherwise negative.    Physical Examination: Filed Vitals:   10/28/14 1916  BP: 125/78  Pulse: 67  Temp: 98.5 F (36.9 C)  Resp: 16   Filed Vitals:   10/28/14 1916  Height: 5\' 1"  (1.549 m)  Weight: 245 lb 9.6 oz (111.403 kg)   Body mass index is 46.43 kg/(m^2). Ideal Body Weight: Weight in (lb) to have BMI = 25: 132  GEN: morbid obesity, NAD, Non-toxic, A & O x 3 HEENT: Atraumatic, Normocephalic. Neck supple. No masses, No LAD. Ears and Nose: No external deformity. CV: RRR, No M/G/R. No JVD. No thrill. No extra heart sounds. PULM: CTA B, diffuse wheezes, no crackles, rhonchi. No retractions. No resp. distress. No accessory muscle use. ABD: S,  NT, ND, +BS. No rebound. No HSM. EXTR: No c/c/e NEURO Normal gait.  PSYCH: Normally interactive. Conversant. Not depressed or anxious appearing.  Calm demeanor.    Assessment and Plan: Bronchitis with bronchospasm biaxin Albuterol  tussionex   Signed,  Ellison Carwin, MD

## 2014-10-28 NOTE — Patient Instructions (Signed)
Metered Dose Inhaler (No Spacer Used)  Inhaled medicines are the basis of treatment for asthma and other breathing problems. Inhaled medicine can only be effective if used properly. Good technique assures that the medicine reaches the lungs.  Metered dose inhalers (MDIs) are used to deliver a variety of inhaled medicines. These include quick relief or rescue medicines (such as bronchodilators) and controller medicines (such as corticosteroids). The medicine is delivered by pushing down on a metal canister to release a set amount of spray.  If you are using different kinds of inhalers, use your quick relief medicine to open the airways 10-15 minutes before using a steroid, if instructed to do so by your health care provider. If you are unsure which inhalers to use and the order of using them, ask your health care provider, nurse, or respiratory therapist.  HOW TO USE THE INHALER  1. Remove the cap from the inhaler.  2. If you are using the inhaler for the first time, you will need to prime it. Shake the inhaler for 5 seconds and release four puffs into the air, away from your face. Ask your health care provider or pharmacist if you have questions about priming your inhaler.  3. Shake the inhaler for 5 seconds before each breath in (inhalation).  4. Position the inhaler so that the top of the canister faces up.  5. Put your index finger on the top of the medicine canister. Your thumb supports the bottom of the inhaler.  6. Open your mouth.  7. Either place the inhaler between your teeth and place your lips tightly around the mouthpiece, or hold the inhaler 1-2 inches away from your open mouth. If you are unsure of which technique to use, ask your health care provider.  8. Breathe out (exhale) normally and as completely as possible.  9. Press the canister down with the index finger to release the medicine.  10. At the same time as the canister is pressed, inhale deeply and slowly until your lungs are completely filled.  This should take 4-6 seconds. Keep your tongue down.  11. Hold the medicine in your lungs for 5-10 seconds (10 seconds is best). This helps the medicine get into the small airways of your lungs.  12. Breathe out slowly, through pursed lips. Whistling is an example of pursed lips.  13. Wait at least 1 minute between puffs. Continue with the above steps until you have taken the number of puffs your health care provider has ordered. Do not use the inhaler more than your health care provider directs you to.  14. Replace the cap on the inhaler.  15. Follow the directions from your health care provider or the inhaler insert for cleaning the inhaler.  If you are using a steroid inhaler, after your last puff, rinse your mouth with water, gargle, and spit out the water. Do not swallow the water.  AVOID:  · Inhaling before or after starting the spray of medicine. It takes practice to coordinate your breathing with triggering the spray.  · Inhaling through the nose (rather than the mouth) when triggering the spray.  HOW TO DETERMINE IF YOUR INHALER IS FULL OR NEARLY EMPTY  You cannot know when an inhaler is empty by shaking it. Some inhalers are now being made with dose counters. Ask your health care provider for a prescription that has a dose counter if you feel you need that extra help. If your inhaler does not have a counter, ask your health care   provider to help you determine the date you need to refill your inhaler. Write the refill date on a calendar or your inhaler canister. Refill your inhaler 7-10 days before it runs out. Be sure to keep an adequate supply of medicine. This includes making sure it has not expired, and making sure you have a spare inhaler.  SEEK MEDICAL CARE IF:  · Symptoms are only partially relieved with your inhaler.  · You are having trouble using your inhaler.  · You experience an increase in phlegm.  SEEK IMMEDIATE MEDICAL CARE IF:  · You feel little or no relief with your inhalers. You are still  wheezing and feeling shortness of breath, tightness in your chest, or both.  · You have dizziness, headaches, or a fast heart rate.  · You have chills, fever, or night sweats.  · There is a noticeable increase in phlegm production, or there is blood in the phlegm.  MAKE SURE YOU:  · Understand these instructions.  · Will watch your condition.  · Will get help right away if you are not doing well or get worse.  Document Released: 05/28/2007 Document Revised: 12/15/2013 Document Reviewed: 01/16/2013  ExitCare® Patient Information ©2015 [employer]. This information is not intended to replace advice given to you by your health care provider. Make sure you discuss any questions you have with your health care provider.

## 2014-10-28 NOTE — Addendum Note (Signed)
Addended by: Suszanne Finch on: 10/28/2014 07:54 PM   Modules accepted: Orders

## 2014-10-28 NOTE — Addendum Note (Signed)
Addended by: Suszanne Finch on: 10/28/2014 07:47 PM   Modules accepted: Orders

## 2014-11-26 ENCOUNTER — Encounter: Payer: Self-pay | Admitting: Adult Health

## 2014-11-26 ENCOUNTER — Ambulatory Visit (INDEPENDENT_AMBULATORY_CARE_PROVIDER_SITE_OTHER): Payer: Managed Care, Other (non HMO) | Admitting: Adult Health

## 2014-11-26 VITALS — BP 128/65 | HR 66 | Ht 61.0 in | Wt 237.0 lb

## 2014-11-26 DIAGNOSIS — R269 Unspecified abnormalities of gait and mobility: Secondary | ICD-10-CM

## 2014-11-26 DIAGNOSIS — E1142 Type 2 diabetes mellitus with diabetic polyneuropathy: Secondary | ICD-10-CM | POA: Diagnosis not present

## 2014-11-26 MED ORDER — GABAPENTIN 300 MG PO CAPS: ORAL_CAPSULE | ORAL | Status: DC

## 2014-11-26 NOTE — Progress Notes (Signed)
I have read the note, and I agree with the clinical assessment and plan.  Yalena Colon KEITH   

## 2014-11-26 NOTE — Progress Notes (Signed)
PATIENT: [name] DOB: August 09, 1951  REASON FOR VISIT: follow up- peripheral neuropathy HISTORY FROM: patient  HISTORY OF PRESENT ILLNESS: [name] is a 64 year old Cooper with a history of peripheral neuropathy. She returns today for follow-up. Her gabapentin was increased to 1 capsule in the morning and 4 capsules in the evening. She states that she has noticed improvement. However she states that she does have some burning and tingling during the day. She is sleeping better at night with the increase in dosage. She has been concerned about increasing her dose in the day due to drowsiness. The patient continues to use a Rollator when ambulating. She states she has had 2 falls since the last visit. One was when she was going to the bathroom and she is unable to use her Rollator. The second fall occurred when she was walking into a convenience store and again she was not using her Rollator. She denies any new medical issues. She returns today for follow-up.  HISTORY 06/04/14: [name] is a 64 year old Cooper with a history of peripheral neuropathy. She returns today for follow-up. She is currently taking gabapentin 300 mg 1 capsule in the AM and 2 capsules in the PM. She reports that she can tell there was some improvement but she does continue to still have discomfort at night. She feels that the pain- burning and tingling- is usually in the toes she also has numbness. She uses a Corporate investment banker when ambulating. Denies any recent falls She does have some insomnia at night due to bursitis in the right hip and bone spurs on the spine. She will be getting injections in the spine as well as starting a prednisone Dosepak.   HISTORY 02/02/14 (CW): [name] with a history of a peripheral neuropathy. The patient has been felt in the past to have diabetes, but her hemoglobin A1c readings have been within normal limits over a number of years. The patient was first felt to have a  peripheral neuropathy in 1999, but the symptoms have gradually worsened over the last several years, particularly over the last 6-12 months. She had nerve conduction studies done through this office in 2011 confirming a peripheral neuropathy. She has Sjogren syndrome as well. She indicates that she has some tingling sensations in the feet and up the legs up to the knees, and some tingling in the hands. The symptoms are worse at night, better during the day. She has some chronic low back pain as well. The patient has been on a number of medications in the past that include Lyrica, Cymbalta, and Topamax. She currently is on gabapentin only 400 mg at night. The patient has some gait instability, but she uses a walker for ambulation. She last fell about one year ago. The patient is sent to this office for further evaluation.  REVIEW OF SYSTEMS: Out of a complete 14 system review of symptoms, the patient complains only of the following symptoms, and all other reviewed systems are negative.  Activity change, unexpected weight change, runny nose, trouble swallowing, leg swelling, palpitations, eye itching, cold intolerance, diarrhea, nausea, insomnia, apnea, environmental allergies, incontinence of bladder, joint pain, back pain, aching muscles, muscle cramps, walking difficulty, itching, numbness, weakness, tremors, swollen lymph nodes  ALLERGIES: Allergies  Allergen Reactions  . Demerol Hives and Other (See Comments)    Fever   . Ivp Dye [Iodinated Diagnostic Agents] Hives and Other (See Comments)    Fever   . Morphine And Related  Other (See Comments)    ineffective  . Adhesive [Tape] Rash    HOME MEDICATIONS: Outpatient Prescriptions Prior to Visit  Medication Sig Dispense Refill  . albuterol (PROVENTIL HFA;VENTOLIN HFA) 108 (90 BASE) MCG/ACT inhaler Inhale 2 puffs into the lungs every 4 (four) hours as needed for wheezing or shortness of breath (cough, shortness of breath or wheezing.). 1 Inhaler 1   . chlorpheniramine-HYDROcodone (TUSSIONEX PENNKINETIC ER) 10-8 MG/5ML LQCR Take 5 mLs by mouth every 12 (twelve) hours as needed. 60 mL 0  . clarithromycin (BIAXIN) 500 MG tablet Take 1 tablet (500 mg total) by mouth 2 (two) times daily. 20 tablet 0  . cycloSPORINE (RESTASIS) 0.05 % ophthalmic emulsion Place 2 drops into both eyes 2 (two) times daily.     . diphenoxylate-atropine (LOMOTIL) 2.5-0.025 MG per tablet Take 1 tablet by mouth as needed.     [name] SURECLICK 50 MG/ML injection Inject 50 mg into the skin once a week.     . furosemide (LASIX) 20 MG tablet Take 1 tablet (20 mg total) by mouth daily. 30 tablet 3  . gabapentin (NEURONTIN) 300 MG capsule One capsule in the morning and four capsules in the evening 450 capsule 3  . HYDROcodone-acetaminophen (NORCO/VICODIN) 5-325 MG per tablet Take 1 tablet by mouth every 6 (six) hours as needed for pain.    . hydroxychloroquine (PLAQUENIL) 200 MG tablet Take 200 mg by mouth 2 (two) times daily. 200 in the am 100 in the pm    . hyoscyamine (LEVBID) 0.375 MG 12 hr tablet Take 0.375 mg by mouth 2 (two) times daily.     . Lido-Capsaicin-Men-Methyl Sal (MEDI-PATCH-LIDOCAINE) 0.5-0.035-5-20 % PTCH Apply 1 patch topically every 12 (twelve) hours as needed.    . metoprolol succinate (TOPROL-XL) 25 MG 24 hr tablet TAKE 1 TABLET DAILY (PATIENT NEEDS AN APPOINTMENT) 90 tablet 1  . Multiple Vit-Min-Calcium-FA (FOLGARD OS PO) Take 1 tablet by mouth 2 (two) times daily.     . pantoprazole (PROTONIX) 40 MG tablet Take 40 mg by mouth daily.    . Probiotic Product (ALIGN) 4 MG CAPS Take 1 capsule by mouth daily.    . Probiotic Product ([place]) 170 MG CAPS Take 1 capsule by mouth every morning.    . promethazine (PHENERGAN) 25 MG tablet Take 25 mg by mouth every 6 (six) hours as needed.      [name] XIFAXAN 550 MG TABS tablet Take 550 mg by mouth 3 (three) times daily.      Facility-Administered Medications Prior to Visit  Medication Dose Route  Frequency Provider Last Rate Last Dose  . chlorhexidine (HIBICLENS) 4 % liquid 4 application  60 mL Topical Once Gaynelle Arabian, MD      . chlorhexidine (HIBICLENS) 4 % liquid 4 application  60 mL Topical Once Gaynelle Arabian, MD        PAST MEDICAL HISTORY: Past Medical History  Diagnosis Date  . Heart murmur     as a child  . Pneumonia     1980's  . Sleep apnea     States SEVERE-on CPAP at 12cm H2O  . Diabetes mellitus     diet controlled  . GERD (gastroesophageal reflux disease)     Barretts esophagitis per pt  . Blood transfusion     1981  . Arthritis     Rheumatoid arthritis,   . Hiatal hernia     sjorgens syndrome  . Fibromyalgia   . Peripheral autonomic neuropathy of unknown cause   .  No pertinent past medical history     Clearence note Dr s.Stoneking, [place] on chart   . Dysrhythmia     /benign PVC's/ EKG 8/12 on  chart  . Diastolic dysfunction   . Polyneuropathy in diabetes(357.2) 02/02/2014    PAST SURGICAL HISTORY: Past Surgical History  Procedure Laterality Date  . Tonsillectomy    . Knee arthroscopy      x2  . Diagnostic laparoscopy      x3  . Tendon repair  1980    left ankle and tibia  . Abdominal hysterectomy      BTL, BSO  . Cholecystectomy    . Tubal ligation    . Patotid cystectomy    . Total knee arthroplasty  06/21/2011    Procedure: TOTAL KNEE ARTHROPLASTY;  Surgeon: Gearlean Alf;  Location: [facility];  Service: Orthopedics;  Laterality: Right;    FAMILY HISTORY: Family History  Problem Relation Age of Onset  . Alzheimer's disease Mother   . Heart attack Mother   . Hypertension Mother   . Glaucoma Mother   . Cancer Father   . Heart attack Brother   . Heart disease Brother   . Glaucoma Brother   . Hyperlipidemia Brother   . Glaucoma Brother     SOCIAL HISTORY: History   Social History  . Marital Status: Married    Spouse Name: N/A  . Number of Children: 2  . Years of Education: AS   Occupational History  . disability   .  RN    Social History Main Topics  . Smoking status: Never Smoker   . Smokeless tobacco: Never Used  . Alcohol Use: Yes     Comment: less than 1 month  . Drug Use: No  . Sexual Activity: Not on file   Other Topics Concern  . Not on file   Social History Narrative      PHYSICAL EXAM  Filed Vitals:   11/26/14 1153  BP: 128/65  Pulse: 66  Height: 5\' 1"  (1.549 m)  Weight: 237 lb (107.502 kg)   Body mass index is 44.8 kg/(m^2).  Generalized: Well developed, in no acute distress   Neurological examination  Mentation: Alert oriented to time, place, history taking. Follows all commands speech and language fluent Cranial nerve II-XII: Pupils were equal round reactive to light. Extraocular movements were full, visual field were full on confrontational test. Facial sensation and strength were normal. Uvula tongue midline. Head turning and shoulder shrug  were normal and symmetric. Motor: The motor testing reveals 5 over 5 strength of all 4 extremities. Good symmetric motor tone is noted throughout.  Sensory: Sensory testing is intact to soft touch on all 4 extremities. No evidence of extinction is noted.  Coordination: Cerebellar testing reveals good finger-nose-finger and heel-to-shin bilaterally.  Gait and station: Uses a Rollator when ambulating. Tandem gait not attended. Reflexes: Deep tendon reflexes are symmetric and normal bilaterally.    DIAGNOSTIC DATA (LABS, IMAGING, TESTING) - I reviewed patient records, labs, notes, testing and imaging myself where available.  Lab Results  Component Value Date   WBC 5.4 08/11/2014   HGB 14.1 08/11/2014   HCT 42.5 08/11/2014   MCV 87.1 08/11/2014   PLT 181.0 08/11/2014      Component Value Date/Time   NA 141 08/11/2014 1059   K 4.1 08/11/2014 1059   CL 106 08/11/2014 1059   CO2 28 08/11/2014 1059   GLUCOSE 96 08/11/2014 1059   BUN 16 08/11/2014 1059  CREATININE 0.8 08/11/2014 1059   CALCIUM 9.2 08/11/2014 1059   PROT 6.9  08/11/2014 1059   PROT 6.3 02/02/2014 1521   ALBUMIN 3.9 08/11/2014 1059   AST 24 08/11/2014 1059   ALT 24 08/11/2014 1059   ALKPHOS 65 08/11/2014 1059   BILITOT 0.6 08/11/2014 1059   GFRNONAA >90 06/23/2011 0441   GFRAA >90 06/23/2011 0441     Lab Results  Component Value Date   VITAMINB12 >1999* 02/02/2014   Lab Results  Component Value Date   TSH 1.23 08/11/2014      ASSESSMENT AND PLAN 64 y.o. year old Cooper  has a past medical history of Heart murmur; Pneumonia; Sleep apnea; Diabetes mellitus; GERD (gastroesophageal reflux disease); Blood transfusion; Arthritis; Hiatal hernia; Fibromyalgia; Peripheral autonomic neuropathy of unknown cause; No pertinent past medical history; Dysrhythmia; Diastolic dysfunction; and Polyneuropathy in diabetes(357.2) (02/02/2014). here with:  1. Peripheral neuropathy  The patient does report some burning and tingling during the day. I will increase her gabapentin to 1 tablet in the morning one tablet at noon and 4 tablets at bedtime. Patient advised that if this causes drowsiness she can decrease the lunch time dose. Will let us know if this is not beneficial. If her symptoms worsen or she develops new symptoms she she'll let us know. Otherwise she'll follow-up in 6 months or sooner if needed.     Ward Givens, MSN, NP-C 11/26/2014, 12:06 PM [facility] [address] [place], [place] [address] [phone]  Note: This document was prepared with digital dictation and possible smart phrase technology. Any transcriptional errors that result from this process are unintentional.

## 2014-11-26 NOTE — Patient Instructions (Signed)
Increase gabapentin to 1 tablet in the morning and noon and 4 tablets at bedtime.  If this is not beneficial then let us know.

## 2015-01-01 ENCOUNTER — Encounter: Payer: Self-pay | Admitting: Cardiology

## 2015-02-08 ENCOUNTER — Other Ambulatory Visit: Payer: Self-pay

## 2015-02-18 ENCOUNTER — Other Ambulatory Visit: Payer: Self-pay | Admitting: Cardiology

## 2015-02-18 ENCOUNTER — Other Ambulatory Visit: Payer: Self-pay

## 2015-02-18 MED ORDER — METOPROLOL SUCCINATE ER 25 MG PO TB24: ORAL_TABLET | ORAL | Status: DC

## 2015-04-19 NOTE — Progress Notes (Signed)
Cardiology Office Note   Date:  04/20/2015   ID:  [name], DOB 1951-04-16, MRN [id number]  PCP:  Mathews Argyle, MD    Chief Complaint  Patient presents with  . OSA      History of Present Illness: [name] is a 64 y.o. female with a history of PVC's, obesity and OSA who presents today for followup. She is doing well. She denies any palpitations. She tolerates her CPAP without any problems but does not sleep well at night because she is primary caregiver for her husband who is bedbound due to massive CVA.[name] She does feel sleepy in the am and has some daytime sleepiness due to not sleeping well and her RA.[name] She tolerates the full face mask and feels the pressure is adequate. She denies any chest pain, dizziness or syncope. She has chronic PVC's with she feels but they do not bother her. She has chronic DOE and mild LE edema.   Past Medical History  Diagnosis Date  . Heart murmur     as a child  . Pneumonia     1980's  . Sleep apnea     States SEVERE-on CPAP at 12cm H2O  . Diabetes mellitus     diet controlled  . GERD (gastroesophageal reflux disease)     Barretts esophagitis per pt  . Blood transfusion     1981  . Arthritis     Rheumatoid arthritis,   . Hiatal hernia     sjorgens syndrome  . Fibromyalgia   . Peripheral autonomic neuropathy of unknown cause   . No pertinent past medical history     Clearence note Dr s.Stoneking, [place] on chart   . Dysrhythmia     /benign PVC's/ EKG 8/12 on  chart  . Diastolic dysfunction   . Polyneuropathy in diabetes(357.2) 02/02/2014    Past Surgical History  Procedure Laterality Date  . Tonsillectomy    . Knee arthroscopy      x2  . Diagnostic laparoscopy      x3  . Tendon repair  1980    left ankle and tibia  . Abdominal hysterectomy      BTL, BSO  . Cholecystectomy    . Tubal ligation    . Patotid cystectomy    . Total knee arthroplasty  06/21/2011    Procedure: TOTAL KNEE  ARTHROPLASTY;  Surgeon: Gearlean Alf;  Location: [facility];  Service: Orthopedics;  Laterality: Right;     Current Outpatient Prescriptions  Medication Sig Dispense Refill  . Abatacept (ORENCIA) 125 MG/ML SOSY Inject 125 mg into the skin once a week.    [name] albuterol (PROVENTIL HFA;VENTOLIN HFA) 108 (90 BASE) MCG/ACT inhaler Inhale 2 puffs into the lungs every 4 (four) hours as needed for wheezing or shortness of breath (cough, shortness of breath or wheezing.). 1 Inhaler 1  . chlorpheniramine-HYDROcodone (TUSSIONEX PENNKINETIC ER) 10-8 MG/5ML LQCR Take 5 mLs by mouth every 12 (twelve) hours as needed. 60 mL 0  . cycloSPORINE (RESTASIS) 0.05 % ophthalmic emulsion Place 2 drops into both eyes 2 (two) times daily.     . diclofenac (FLECTOR) 1.3 % PTCH Place 1 patch onto the skin as needed.    . diphenoxylate-atropine (LOMOTIL) 2.5-0.025 MG per tablet Take 1 tablet by mouth 4 (four) times daily as needed for diarrhea or loose stools.    . Eluxadoline (VIBERZI) 100 MG TABS  Take 100 tablets by mouth 2 (two) times daily.    . furosemide (LASIX) 20 MG tablet Take 1 tablet (20 mg total) by mouth daily. 30 tablet 3  . gabapentin (NEURONTIN) 300 MG capsule One capsule in the morning and at noon and four capsules in the evening 450 capsule 3  . hydroxychloroquine (PLAQUENIL) 200 MG tablet Take 200 mg by mouth 2 (two) times daily. 200 in the am 100 in the pm    . hyoscyamine (LEVBID) 0.375 MG 12 hr tablet Take 0.375 mg by mouth 2 (two) times daily.     . Lido-Capsaicin-Men-Methyl Sal (MEDI-PATCH-LIDOCAINE) 0.5-0.035-5-20 % PTCH Apply 1 patch topically every 12 (twelve) hours as needed.    . lidocaine (LIDODERM) 5 % Place 1 patch onto the skin as needed. Remove & Discard patch within 12 hours or as directed by MD    . methocarbamol (ROBAXIN) 500 MG tablet Take 500 mg by mouth 4 (four) times daily.    . metoprolol succinate (TOPROL-XL) 25 MG 24 hr tablet TAKE 1 TABLET DAILY (PATIENT NEEDS AN APPOINTMENT) 90  tablet 0  . Multiple Vit-Min-Calcium-FA (FOLGARD OS) 500-1.1 MG TABS Take 1 tablet by mouth 2 (two) times daily.    . pantoprazole (PROTONIX) 40 MG tablet Take 40 mg by mouth daily.    . Probiotic Product (ALIGN) 4 MG CAPS Take 1 capsule by mouth daily.    . Probiotic Product (Comfort) 170 MG CAPS Take 1 capsule by mouth every morning.    . promethazine (PHENERGAN) 25 MG tablet Take 25 mg by mouth every 6 (six) hours as needed.      . silver sulfADIAZINE (SILVADENE) 1 % cream Apply 1 application topically as needed.     No current facility-administered medications for this visit.   Facility-Administered Medications Ordered in Other Visits  Medication Dose Route Frequency Provider Last Rate Last Dose  . chlorhexidine (HIBICLENS) 4 % liquid 4 application  60 mL Topical Once Gaynelle Arabian, MD      . chlorhexidine (HIBICLENS) 4 % liquid 4 application  60 mL Topical Once Gaynelle Arabian, MD        Allergies:   Demerol; Ivp dye; Morphine and related; and Adhesive    Social History:  The patient  reports that she has never smoked. She has never used smokeless tobacco. She reports that she drinks alcohol. She reports that she does not use illicit drugs.   Family History:  The patient's family history includes Alzheimer's disease in her mother; Cancer in her father; Glaucoma in her brother, brother, and mother; Heart attack in her brother and mother; Heart disease in her brother; Hyperlipidemia in her brother; Hypertension in her mother.    ROS:  Please see the history of present illness.   Otherwise, review of systems are positive for none.   All other systems are reviewed and negative.    PHYSICAL EXAM: VS:  BP 118/64 mmHg  Pulse 68  Ht 5\' 1"  (1.549 m)  Wt 224 lb (101.606 kg)  BMI 42.35 kg/m2  SpO2 96% , BMI Body mass index is 42.35 kg/(m^2). GEN: Well nourished, well developed, in no acute distress HEENT: normal Neck: no JVD, carotid bruits, or masses Cardiac: RRR; no  murmurs, rubs, or gallops,no edema  Respiratory:  clear to auscultation bilaterally, normal work of breathing GI: soft, nontender, nondistended, + BS MS: no deformity or atrophy Skin: warm and dry, no rash Neuro:  Strength and sensation are intact Psych: euthymic mood, full affect  EKG:  EKG is not ordered today.    Recent Labs: 08/11/2014: ALT 24; BUN 16; Creatinine, Ser 0.8; Hemoglobin 14.1; Platelets 181.0; Potassium 4.1; Pro B Natriuretic peptide (BNP) 105.0*; Sodium 141; TSH 1.23    Lipid Panel No results found for: CHOL, TRIG, HDL, CHOLHDL, VLDL, LDLCALC, LDLDIRECT    Wt Readings from Last 3 Encounters:  04/20/15 224 lb (101.606 kg)  11/26/14 237 lb (107.502 kg)  10/28/14 245 lb 9.6 oz (111.403 kg)     ASSESSMENT AND PLAN:  1. PVC's - tolerates well - continue metoprolol 2. OSA on CPAP and tolerating well - I will get a download from her DME.  She would like to get a new machine so I will check with her DME. 3. Obesity - Her exercise is limited by her peripheral neuropathy and back pain 4. Chronic diastolic CHF with LE edema - she has LE edema on exam today.  I have recommended that we increase lasix to 20mg  2 tablets daily for 3 days and then daily.   Check BMET in 1 week   Current medicines are reviewed at length with the patient today.  The patient does not have concerns regarding medicines.  The following changes have been made:  no change  Labs/ tests ordered today: See above Assessment and Plan No orders of the defined types were placed in this encounter.     Disposition:   FU with me in 6 months  Signed, Sueanne Margarita, MD  04/20/2015 2:16 PM    [facility] [place], [place], [place]  [address] Phone: [phone]; Fax: [phone]

## 2015-04-20 ENCOUNTER — Encounter: Payer: Self-pay | Admitting: Cardiology

## 2015-04-20 ENCOUNTER — Ambulatory Visit (INDEPENDENT_AMBULATORY_CARE_PROVIDER_SITE_OTHER): Payer: Managed Care, Other (non HMO) | Admitting: Cardiology

## 2015-04-20 VITALS — BP 118/64 | HR 68 | Ht 61.0 in | Wt 224.0 lb

## 2015-04-20 DIAGNOSIS — G4733 Obstructive sleep apnea (adult) (pediatric): Secondary | ICD-10-CM | POA: Diagnosis not present

## 2015-04-20 DIAGNOSIS — I493 Ventricular premature depolarization: Secondary | ICD-10-CM

## 2015-04-20 DIAGNOSIS — I5032 Chronic diastolic (congestive) heart failure: Secondary | ICD-10-CM | POA: Insufficient documentation

## 2015-04-20 MED ORDER — FUROSEMIDE 20 MG PO TABS: 20.0000 mg | ORAL_TABLET | Freq: Every day | ORAL | Status: DC

## 2015-04-20 MED ORDER — METOPROLOL SUCCINATE ER 25 MG PO TB24: 25.0000 mg | ORAL_TABLET | Freq: Every day | ORAL | Status: DC

## 2015-04-20 NOTE — Patient Instructions (Signed)
Medication Instructions:  Your physician has recommended you make the following change in your medication: 1) INCREASE LASIX to 20 mg 2 tablets daily for three days then resume 1 tablet daily  Labwork: Your physician recommends that you return for lab work in: [place] ([facility])  Testing/Procedures: None  Follow-Up: Your physician wants you to follow-up in: 6 months with Dr. Radford Pax. You will receive a reminder letter in the mail two months in advance. If you don't receive a letter, please call our office to schedule the follow-up appointment.   Any Other Special Instructions Will Be Listed Below (If Applicable). [facility] will be calling you soon to talk about new machine eligibility.

## 2015-04-27 ENCOUNTER — Other Ambulatory Visit (INDEPENDENT_AMBULATORY_CARE_PROVIDER_SITE_OTHER): Payer: Managed Care, Other (non HMO) | Admitting: *Deleted

## 2015-04-27 DIAGNOSIS — I5032 Chronic diastolic (congestive) heart failure: Secondary | ICD-10-CM

## 2015-04-27 LAB — BASIC METABOLIC PANEL
BUN: 16 mg/dL (ref 6–23)
CHLORIDE: 102 meq/L (ref 96–112)
CO2: 30 meq/L (ref 19–32)
CREATININE: 0.84 mg/dL (ref 0.40–1.20)
Calcium: 9.5 mg/dL (ref 8.4–10.5)
GFR: 72.81 mL/min (ref >60.00)
Glucose, Bld: 111 mg/dL — ABNORMAL HIGH (ref 70–99)
POTASSIUM: 3.8 meq/L (ref 3.5–5.1)
SODIUM: 140 meq/L (ref 135–145)

## 2015-04-27 NOTE — Addendum Note (Signed)
Addended by: Eulis Foster on: 04/27/2015 02:43 PM   Modules accepted: Orders

## 2015-05-20 ENCOUNTER — Encounter: Payer: Self-pay | Admitting: Cardiology

## 2015-05-27 ENCOUNTER — Encounter: Payer: Self-pay | Admitting: Adult Health

## 2015-05-27 ENCOUNTER — Ambulatory Visit (INDEPENDENT_AMBULATORY_CARE_PROVIDER_SITE_OTHER): Payer: Managed Care, Other (non HMO) | Admitting: Adult Health

## 2015-05-27 VITALS — BP 105/55 | HR 61 | Ht 61.0 in | Wt 237.0 lb

## 2015-05-27 DIAGNOSIS — G609 Hereditary and idiopathic neuropathy, unspecified: Secondary | ICD-10-CM

## 2015-05-27 MED ORDER — GABAPENTIN 300 MG PO CAPS: ORAL_CAPSULE | ORAL | Status: DC

## 2015-05-27 NOTE — Progress Notes (Signed)
I have read the note, and I agree with the clinical assessment and plan.  Sherrill Buikema KEITH   

## 2015-05-27 NOTE — Patient Instructions (Addendum)
Increase gabapentin to 5 tablets at bedtime and 2 tablets in the AM. I will call in compound cream for legs. If your symptoms worsen or you develop new symptoms please let us know.

## 2015-05-27 NOTE — Progress Notes (Signed)
PATIENT: [name] DOB: 20-Dec-1950  REASON FOR VISIT: follow up- peripheral neuropathy HISTORY FROM: patient  HISTORY OF PRESENT ILLNESS: [name] is a 64 year old female with a history of peripheral neuropathy. She returns today for follow-up. At the last visit her gabapentin was increased to 1 tablet in the morning, 1 tablet at noon and 4 tablets at bedtime. She reports that she was often missing the noon dose. Therefore she begin taking 2 tablets in the morning. She states this has helped her discomfort during the day. She states it is now tolerable. She states that she  is continuing to have more discomfort at bedtime. She describes her pain as sharp shooting pains in the legs and in the toes. Denies any changes with her gait or balance. She uses her Rollator consistently. Denies any recent falls. She does report that she is under more stress at this time as her husband is in hospice. She returns today for an evaluation.  HISTORY 11/26/14: [name] is a 64 year old female with a history of peripheral neuropathy. She returns today for follow-up. Her gabapentin was increased to 1 capsule in the morning and 4 capsules in the evening. She states that she has noticed improvement. However she states that she does have some burning and tingling during the day. She is sleeping better at night with the increase in dosage. She has been concerned about increasing her dose in the day due to drowsiness. The patient continues to use a Rollator when ambulating. She states she has had 2 falls since the last visit. One was when she was going to the bathroom and she is unable to use her Rollator. The second fall occurred when she was walking into a convenience store and again she was not using her Rollator. She denies any new medical issues. She returns today for follow-up.  REVIEW OF SYSTEMS: Out of a complete 14 system review of symptoms, the patient complains only of the following symptoms, and all  other reviewed systems are negative.  Appetite change, cold intolerance, leg swelling, restless leg, insomnia, apnea, frequent waking, daytime sleepiness, incontinence of bladder, joint pain, joint swelling, back pain, aching muscles, walking difficulty, neck stiffness, depression, weakness, numbness, swollen lymph nodes  ALLERGIES: Allergies  Allergen Reactions  . Demerol Hives and Other (See Comments)    Fever   . Ivp Dye [Iodinated Diagnostic Agents] Hives and Other (See Comments)    Fever   . Morphine And Related Other (See Comments)    ineffective  . Adhesive [Tape] Rash    HOME MEDICATIONS: Outpatient Prescriptions Prior to Visit  Medication Sig Dispense Refill  . Abatacept (ORENCIA) 125 MG/ML SOSY Inject 125 mg into the skin once a week.    [name] albuterol (PROVENTIL HFA;VENTOLIN HFA) 108 (90 BASE) MCG/ACT inhaler Inhale 2 puffs into the lungs every 4 (four) hours as needed for wheezing or shortness of breath (cough, shortness of breath or wheezing.). 1 Inhaler 1  . chlorpheniramine-HYDROcodone (TUSSIONEX PENNKINETIC ER) 10-8 MG/5ML LQCR Take 5 mLs by mouth every 12 (twelve) hours as needed. 60 mL 0  . cycloSPORINE (RESTASIS) 0.05 % ophthalmic emulsion Place 2 drops into both eyes 2 (two) times daily.     . diclofenac (FLECTOR) 1.3 % PTCH Place 1 patch onto the skin as needed.    . diphenoxylate-atropine (LOMOTIL) 2.5-0.025 MG per tablet Take 1 tablet by mouth 4 (four) times daily as needed for diarrhea or loose stools.    . Eluxadoline (VIBERZI) 100 MG  TABS Take 100 tablets by mouth 2 (two) times daily.    . furosemide (LASIX) 20 MG tablet Take 1 tablet (20 mg total) by mouth daily. 90 tablet 3  . gabapentin (NEURONTIN) 300 MG capsule One capsule in the morning and at noon and four capsules in the evening 450 capsule 3  . hydroxychloroquine (PLAQUENIL) 200 MG tablet Take 200 mg by mouth 2 (two) times daily. 200 in the am 100 in the pm    . hyoscyamine (LEVBID) 0.375 MG 12 hr tablet  Take 0.375 mg by mouth 2 (two) times daily.     [name] lidocaine (LIDODERM) 5 % Place 1 patch onto the skin as needed. Remove & Discard patch within 12 hours or as directed by MD    . methocarbamol (ROBAXIN) 500 MG tablet Take 500 mg by mouth 4 (four) times daily.    . metoprolol succinate (TOPROL-XL) 25 MG 24 hr tablet Take 1 tablet (25 mg total) by mouth daily. 90 tablet 3  . Multiple Vit-Min-Calcium-FA (FOLGARD OS) 500-1.1 MG TABS Take 1 tablet by mouth 2 (two) times daily.    . pantoprazole (PROTONIX) 40 MG tablet Take 40 mg by mouth daily.    . Probiotic Product (ALIGN) 4 MG CAPS Take 1 capsule by mouth daily.    . Probiotic Product ([place]) 170 MG CAPS Take 1 capsule by mouth every morning.    . promethazine (PHENERGAN) 25 MG tablet Take 25 mg by mouth every 6 (six) hours as needed.      . silver sulfADIAZINE (SILVADENE) 1 % cream Apply 1 application topically as needed.    . Lido-Capsaicin-Men-Methyl Sal (MEDI-PATCH-LIDOCAINE) 0.5-0.035-5-20 % PTCH Apply 1 patch topically every 12 (twelve) hours as needed.     Facility-Administered Medications Prior to Visit  Medication Dose Route Frequency Provider Last Rate Last Dose  . chlorhexidine (HIBICLENS) 4 % liquid 4 application  60 mL Topical Once Gaynelle Arabian, MD      . chlorhexidine (HIBICLENS) 4 % liquid 4 application  60 mL Topical Once Gaynelle Arabian, MD        PAST MEDICAL HISTORY: Past Medical History  Diagnosis Date  . Heart murmur     as a child  . Pneumonia     1980's  . Sleep apnea     States SEVERE-on CPAP at 12cm H2O  . Diabetes mellitus     diet controlled  . GERD (gastroesophageal reflux disease)     Barretts esophagitis per pt  . Blood transfusion     1981  . Arthritis     Rheumatoid arthritis,   . Hiatal hernia     sjorgens syndrome  . Fibromyalgia   . Peripheral autonomic neuropathy of unknown cause   . No pertinent past medical history     [place] note Dr s.Stoneking, [place] on chart   .  Dysrhythmia     /benign PVC's/ EKG 8/12 on  chart  . Diastolic dysfunction   . Polyneuropathy in diabetes(357.2) 02/02/2014    PAST SURGICAL HISTORY: Past Surgical History  Procedure Laterality Date  . Tonsillectomy    . Knee arthroscopy      x2  . Diagnostic laparoscopy      x3  . Tendon repair  1980    left ankle and tibia  . Abdominal hysterectomy      BTL, BSO  . Cholecystectomy    . Tubal ligation    . Patotid cystectomy    . Total knee arthroplasty  06/21/2011  Procedure: TOTAL KNEE ARTHROPLASTY;  Surgeon: Gearlean Alf;  Location: [facility];  Service: Orthopedics;  Laterality: Right;    FAMILY HISTORY: Family History  Problem Relation Age of Onset  . Alzheimer's disease Mother   . Heart attack Mother   . Hypertension Mother   . Glaucoma Mother   . Cancer Father   . Heart attack Brother   . Heart disease Brother   . Glaucoma Brother   . Hyperlipidemia Brother   . Glaucoma Brother     SOCIAL HISTORY: Social History   Social History  . Marital Status: Married    Spouse Name: N/A  . Number of Children: 2  . Years of Education: AS   Occupational History  . disability   . RN    Social History Main Topics  . Smoking status: Never Smoker   . Smokeless tobacco: Never Used  . Alcohol Use: Yes     Comment: less than 1 month  . Drug Use: No  . Sexual Activity: Not on file   Other Topics Concern  . Not on file   Social History Narrative      PHYSICAL EXAM  Filed Vitals:   05/27/15 1139  BP: 105/55  Pulse: 61  Height: 5\' 1"  (1.549 m)  Weight: 237 lb (107.502 kg)   Body mass index is 44.8 kg/(m^2).  Generalized: Well developed, in no acute distress   Neurological examination  Mentation: Alert oriented to time, place, history taking. Follows all commands speech and language fluent Cranial nerve II-XII: Pupils were equal round reactive to light. Extraocular movements were full, visual field were full on confrontational test. Facial sensation  and strength were normal. Uvula tongue midline. Head turning and shoulder shrug  were normal and symmetric. Motor: The motor testing reveals 5 over 5 strength of all 4 extremities. Good symmetric motor tone is noted throughout. No sores or open wounds on the feet. Sensory: Sensory testing is intact to soft touch on all 4 extremities. No evidence of extinction is noted.  Coordination: Cerebellar testing reveals good finger-nose-finger and heel-to-shin bilaterally.  Gait and station: Patient uses a Rollator when ambulating. Tandem gait not attempted. Reflexes: Deep tendon reflexes are symmetric and normal bilaterally.   DIAGNOSTIC DATA (LABS, IMAGING, TESTING) - I reviewed patient records, labs, notes, testing and imaging myself where available.  Lab Results  Component Value Date   WBC 5.4 08/11/2014   HGB 14.1 08/11/2014   HCT 42.5 08/11/2014   MCV 87.1 08/11/2014   PLT 181.0 08/11/2014      Component Value Date/Time   NA 140 04/27/2015 1443   K 3.8 04/27/2015 1443   CL 102 04/27/2015 1443   CO2 30 04/27/2015 1443   GLUCOSE 111* 04/27/2015 1443   BUN 16 04/27/2015 1443   CREATININE 0.84 04/27/2015 1443   CALCIUM 9.5 04/27/2015 1443   PROT 6.9 08/11/2014 1059   PROT 6.3 02/02/2014 1521   ALBUMIN 3.9 08/11/2014 1059   AST 24 08/11/2014 1059   ALT 24 08/11/2014 1059   ALKPHOS 65 08/11/2014 1059   BILITOT 0.6 08/11/2014 1059   GFRNONAA >90 06/23/2011 0441   GFRAA >90 06/23/2011 0441    ASSESSMENT AND PLAN 64 y.o. year old female  has a past medical history of Heart murmur; Pneumonia; Sleep apnea; Diabetes mellitus; GERD (gastroesophageal reflux disease); Blood transfusion; Arthritis; Hiatal hernia; Fibromyalgia; Peripheral autonomic neuropathy of unknown cause; No pertinent past medical history; Dysrhythmia; Diastolic dysfunction; and Polyneuropathy in diabetes(357.2) (02/02/2014). here with:  1.  Peripheral neuropathy  The patient continues to have some discomfort at bedtime.  For that reason I'll increase her gabapentin to 5 tablets at bedtime. She will continue taking 2 tablets in the a.m. I will also call  in compounded cream for the patient to use at bedtime. She will let me know if her insurance does not cover this. Patient advised that if her symptoms worsen or she develops new symptoms she should let us know. She will follow-up in 6 months or sooner if needed.    Ward Givens, MSN, NP-C 05/27/2015, 11:47 AM [facility] [address], [place], [place] [address] [phone]

## 2015-06-21 ENCOUNTER — Encounter (INDEPENDENT_AMBULATORY_CARE_PROVIDER_SITE_OTHER): Payer: Medicare Other | Admitting: Ophthalmology

## 2015-06-30 ENCOUNTER — Encounter (INDEPENDENT_AMBULATORY_CARE_PROVIDER_SITE_OTHER): Payer: Managed Care, Other (non HMO) | Admitting: Ophthalmology

## 2015-07-13 ENCOUNTER — Encounter (INDEPENDENT_AMBULATORY_CARE_PROVIDER_SITE_OTHER): Payer: Managed Care, Other (non HMO) | Admitting: Ophthalmology

## 2015-07-13 DIAGNOSIS — M069 Rheumatoid arthritis, unspecified: Secondary | ICD-10-CM | POA: Diagnosis not present

## 2015-07-13 DIAGNOSIS — H43813 Vitreous degeneration, bilateral: Secondary | ICD-10-CM | POA: Diagnosis not present

## 2015-07-13 DIAGNOSIS — H2513 Age-related nuclear cataract, bilateral: Secondary | ICD-10-CM | POA: Diagnosis not present

## 2015-07-13 DIAGNOSIS — Z79899 Other long term (current) drug therapy: Secondary | ICD-10-CM

## 2015-07-29 ENCOUNTER — Telehealth: Payer: Self-pay | Admitting: Adult Health

## 2015-07-29 MED ORDER — GABAPENTIN 300 MG PO CAPS: ORAL_CAPSULE | ORAL | Status: DC

## 2015-07-29 NOTE — Telephone Encounter (Signed)
Rx has been sent.  Receipt confirmed by pharmacy.   

## 2015-07-29 NOTE — Telephone Encounter (Signed)
Patient called to request Rx renewal for gabapentin (NEURONTIN) 300 MG capsule. Request that Rx be faxed to [facility].

## 2015-10-05 DIAGNOSIS — M0579 Rheumatoid arthritis with rheumatoid factor of multiple sites without organ or systems involvement: Secondary | ICD-10-CM | POA: Diagnosis not present

## 2015-10-05 DIAGNOSIS — M797 Fibromyalgia: Secondary | ICD-10-CM | POA: Diagnosis not present

## 2015-10-05 DIAGNOSIS — I7300 Raynaud's syndrome without gangrene: Secondary | ICD-10-CM | POA: Diagnosis not present

## 2015-10-05 DIAGNOSIS — Z09 Encounter for follow-up examination after completed treatment for conditions other than malignant neoplasm: Secondary | ICD-10-CM | POA: Diagnosis not present

## 2015-10-15 DIAGNOSIS — Z5181 Encounter for therapeutic drug level monitoring: Secondary | ICD-10-CM | POA: Diagnosis not present

## 2015-10-22 DIAGNOSIS — Z79899 Other long term (current) drug therapy: Secondary | ICD-10-CM | POA: Diagnosis not present

## 2015-10-25 NOTE — Progress Notes (Signed)
Cardiology Office Note   Date:  10/26/2015   ID:  [name], DOB 28-Oct-1950, MRN [id number]  PCP:  Mathews Argyle, MD    Chief Complaint  Patient presents with  . Sleep Apnea  . Hypertension  . Congestive Heart Failure      History of Present Illness: [name] is a 65 y.o. female with a history of PVC's, obesity and OSA who presents today for followup. She is doing well. She denies any palpitations. She tolerates her CPAP without any problems . She tolerates the full face mask and feels the pressure is adequate. She denies any chest pain, dizziness or syncope. She has chronic PVC's with she feels but they do not bother her. She has chronic DOE and mild LE edema but these are stable.  She has some problems with insomnia.  She goes to sleep without any problems and then wakes up at 4-5am.  She has been trying melatonin.     Past Medical History  Diagnosis Date  . Heart murmur     as a child  . Pneumonia     1980's  . Sleep apnea     States SEVERE-on CPAP at 12cm H2O  . Diabetes mellitus     diet controlled  . GERD (gastroesophageal reflux disease)     Barretts esophagitis per pt  . Blood transfusion     1981  . Arthritis     Rheumatoid arthritis,   . Hiatal hernia     sjorgens syndrome  . Fibromyalgia   . Peripheral autonomic neuropathy of unknown cause   . No pertinent past medical history     [place] note Dr s.Stoneking, Frederico Gerling on chart   . Dysrhythmia     /benign PVC's/ EKG 8/12 on  chart  . Diastolic dysfunction   . Polyneuropathy in diabetes(357.2) 02/02/2014    Past Surgical History  Procedure Laterality Date  . Tonsillectomy    . Knee arthroscopy      x2  . Diagnostic laparoscopy      x3  . Tendon repair  1980    left ankle and tibia  . Abdominal hysterectomy      BTL, BSO  . Cholecystectomy    . Tubal ligation    . Patotid cystectomy    . Total knee arthroplasty  06/21/2011    Procedure: TOTAL KNEE  ARTHROPLASTY;  Surgeon: Gearlean Alf;  Location: [facility];  Service: Orthopedics;  Laterality: Right;     Current Outpatient Prescriptions  Medication Sig Dispense Refill  . cycloSPORINE (RESTASIS) 0.05 % ophthalmic emulsion Place 2 drops into both eyes 2 (two) times daily.     . diclofenac (FLECTOR) 1.3 % PTCH Place 1 patch onto the skin as needed.    . diphenoxylate-atropine (LOMOTIL) 2.5-0.025 MG per tablet Take 1 tablet by mouth 4 (four) times daily as needed for diarrhea or loose stools.    . Eluxadoline (VIBERZI) 100 MG TABS Take 100 tablets by mouth 2 (two) times daily.    . furosemide (LASIX) 20 MG tablet Take 1 tablet (20 mg total) by mouth daily. 90 tablet 3  . gabapentin (NEURONTIN) 300 MG capsule two capsule in the morning and 5 tablets at bedtime 630 capsule 3  . hyoscyamine (LEVBID) 0.375 MG 12 hr tablet Take 0.375 mg by mouth 2 (two) times daily.     [name] leflunomide (ARAVA) 20 MG tablet  Take 20 mg by mouth daily.    [name] lidocaine (LIDODERM) 5 % Place 1 patch onto the skin as needed. Remove & Discard patch within 12 hours or as directed by MD    . methocarbamol (ROBAXIN) 500 MG tablet Take 500 mg by mouth 4 (four) times daily.    . metoprolol succinate (TOPROL-XL) 25 MG 24 hr tablet Take 1 tablet (25 mg total) by mouth daily. 90 tablet 3  . Multiple Vit-Min-Calcium-FA (FOLGARD OS) 500-1.1 MG TABS Take 1 tablet by mouth 2 (two) times daily.    . pantoprazole (PROTONIX) 40 MG tablet Take 40 mg by mouth daily.    . Probiotic Product (ALIGN) 4 MG CAPS Take 1 capsule by mouth daily.    . Probiotic Product ([place]) 170 MG CAPS Take 1 capsule by mouth every morning.    . promethazine (PHENERGAN) 25 MG tablet Take 25 mg by mouth every 6 (six) hours as needed.      . silver sulfADIAZINE (SILVADENE) 1 % cream Apply 1 application topically as needed.    . Abatacept (ORENCIA) 125 MG/ML SOSY Inject 125 mg into the skin once a week. Reported on 10/26/2015     No current  facility-administered medications for this visit.   Facility-Administered Medications Ordered in Other Visits  Medication Dose Route Frequency Provider Last Rate Last Dose  . chlorhexidine (HIBICLENS) 4 % liquid 4 application  60 mL Topical Once Gaynelle Arabian, MD      . chlorhexidine (HIBICLENS) 4 % liquid 4 application  60 mL Topical Once Gaynelle Arabian, MD        Allergies:   Demerol; Ivp dye; Morphine and related; and Adhesive    Social History:  The patient  reports that she has never smoked. She has never used smokeless tobacco. She reports that she drinks alcohol. She reports that she does not use illicit drugs.   Family History:  The patient's family history includes Alzheimer's disease in her mother; Cancer in her father; Glaucoma in her brother, brother, and mother; Heart attack in her brother and mother; Heart disease in her brother; Hyperlipidemia in her brother; Hypertension in her mother.    ROS:  Please see the history of present illness.   Otherwise, review of systems are positive for none.   All other systems are reviewed and negative.    PHYSICAL EXAM: VS:  BP 110/60 mmHg  Pulse 64  Ht 5\' 1"  (1.549 m)  Wt 239 lb (108.41 kg)  BMI 45.18 kg/m2 , BMI Body mass index is 45.18 kg/(m^2). GEN: Well nourished, well developed, in no acute distress HEENT: normal Neck: no JVD, carotid bruits, or masses Cardiac: RRR; no murmurs, rubs, or gallops,no edema  Respiratory:  clear to auscultation bilaterally, normal work of breathing GI: soft, nontender, nondistended, + BS MS: no deformity or atrophy Skin: warm and dry, no rash Neuro:  Strength and sensation are intact Psych: euthymic mood, full affect   EKG:  EKG is not ordered today.    Recent Labs: 04/27/2015: BUN 16; Creatinine, Ser 0.84; Potassium 3.8; Sodium 140    Lipid Panel No results found for: CHOL, TRIG, HDL, CHOLHDL, VLDL, LDLCALC, LDLDIRECT    Wt Readings from Last 3 Encounters:  10/26/15 239 lb (108.41 kg)   05/27/15 237 lb (107.502 kg)  04/20/15 224 lb (101.606 kg)     ASSESSMENT AND PLAN:  1. PVC's - tolerates well - continue metoprolol 2. OSA on CPAP and tolerating well. Her d/l showed an  AHI of 4.8/hr on 12cm H2O but only 47% compliant in using more than 4 hours nightly.  She has had some problems sleeping recently due to her neruopathy.   3. Obesity - Her exercise is limited by her peripheral neuropathy and back pain   4.       Chronic diastolic CHF - she appears euvolemic on exam.  Continue Lasix/BB.    Current medicines are reviewed at length with the patient today.  The patient does not have concerns regarding medicines.  The following changes have been made:  no change  Labs/ tests ordered today: See above Assessment and Plan No orders of the defined types were placed in this encounter.     Disposition:   FU with me in 6 months  Signed, Sueanne Margarita, MD  10/26/2015 11:31 AM    [facility] [place], [place], [place]  [address] Phone: [phone]; Fax: [phone]

## 2015-10-26 ENCOUNTER — Ambulatory Visit (INDEPENDENT_AMBULATORY_CARE_PROVIDER_SITE_OTHER): Payer: PPO | Admitting: Cardiology

## 2015-10-26 ENCOUNTER — Encounter: Payer: Self-pay | Admitting: Cardiology

## 2015-10-26 VITALS — BP 110/60 | HR 64 | Ht 61.0 in | Wt 239.0 lb

## 2015-10-26 DIAGNOSIS — I5032 Chronic diastolic (congestive) heart failure: Secondary | ICD-10-CM | POA: Diagnosis not present

## 2015-10-26 DIAGNOSIS — G4733 Obstructive sleep apnea (adult) (pediatric): Secondary | ICD-10-CM

## 2015-10-26 DIAGNOSIS — I493 Ventricular premature depolarization: Secondary | ICD-10-CM | POA: Diagnosis not present

## 2015-10-26 NOTE — Patient Instructions (Signed)

## 2015-10-27 ENCOUNTER — Ambulatory Visit: Payer: Medicare Other | Admitting: Adult Health

## 2015-11-01 ENCOUNTER — Encounter: Payer: Self-pay | Admitting: Cardiology

## 2015-11-03 ENCOUNTER — Ambulatory Visit (INDEPENDENT_AMBULATORY_CARE_PROVIDER_SITE_OTHER): Payer: PPO | Admitting: Adult Health

## 2015-11-03 ENCOUNTER — Encounter: Payer: Self-pay | Admitting: Adult Health

## 2015-11-03 VITALS — BP 131/72 | HR 59 | Ht 61.0 in | Wt 242.0 lb

## 2015-11-03 DIAGNOSIS — G609 Hereditary and idiopathic neuropathy, unspecified: Secondary | ICD-10-CM | POA: Diagnosis not present

## 2015-11-03 DIAGNOSIS — R269 Unspecified abnormalities of gait and mobility: Secondary | ICD-10-CM | POA: Diagnosis not present

## 2015-11-03 NOTE — Progress Notes (Signed)
I have read the note, and I agree with the clinical assessment and plan.  [name]   

## 2015-11-03 NOTE — Progress Notes (Signed)
PATIENT: [name] DOB: [birth year]  REASON FOR VISIT: follow up-peripheral neuropathy HISTORY FROM: patient  HISTORY OF PRESENT ILLNESS: [name] is a 65 year old female with a history of peripheral neuropathy. She returns today for follow-up. The patient states that even with increasing gabapentin she continues to have discomfort at night. She states that she has burning tingling sensations in the lower extremities. She states that this can become very painful and makes it hard for her to fall asleep and stay asleep. She states that she was able to use some of the cream and found it beneficial however her insurance changed and would not cover the cream any longer. She states that since then she's now on a new insurance. She continues to use a Rollator when ambulating. She denies any recent falls.  HISTORY 05/27/15: [name] is a 65 year old female with a history of peripheral neuropathy. She returns today for follow-up. At the last visit her gabapentin was increased to 1 tablet in the morning, 1 tablet at noon and 4 tablets at bedtime. She reports that she was often missing the noon dose. Therefore she begin taking 2 tablets in the morning. She states this has helped her discomfort during the day. She states it is now tolerable. She states that she is continuing to have more discomfort at bedtime. She describes her pain as sharp shooting pains in the legs and in the toes. Denies any changes with her gait or balance. She uses her Rollator consistently. Denies any recent falls. She does report that she is under more stress at this time as her husband is in hospice. She returns today for an evaluation.  HISTORY 11/26/14: [name] is a 65 year old female with a history of peripheral neuropathy. She returns today for follow-up. Her gabapentin was increased to 1 capsule in the morning and 4 capsules in the evening. She states that she has noticed improvement. However she states that she does  have some burning and tingling during the day. She is sleeping better at night with the increase in dosage. She has been concerned about increasing her dose in the day due to drowsiness. The patient continues to use a Rollator when ambulating. She states she has had 2 falls since the last visit. One was when she was going to the bathroom and she is unable to use her Rollator. The second fall occurred when she was walking into a convenience store and again she was not using her Rollator. She denies any new medical issues. She returns today for follow-up.   REVIEW OF SYSTEMS: Out of a complete 14 system review of symptoms, the patient complains only of the following symptoms, and all other reviewed systems are negative.  Fatigue, unexpected weight change, runny nose, leg swelling, palpitations, insomnia, apnea, frequent waking, daytime sleepiness, nausea, cold intolerance, incontinence of bladder, joint pain, joint swelling, back pain, aching muscles, muscle cramps, itching, numbness, swollen lymph nodes  ALLERGIES: Allergies  Allergen Reactions  . Demerol Hives and Other (See Comments)    Fever   . Ivp Dye [Iodinated Diagnostic Agents] Hives and Other (See Comments)    Fever   . Morphine And Related Other (See Comments)    ineffective  . Adhesive [Tape] Rash    HOME MEDICATIONS: Outpatient Prescriptions Prior to Visit  Medication Sig Dispense Refill  . cycloSPORINE (RESTASIS) 0.05 % ophthalmic emulsion Place 2 drops into both eyes 2 (two) times daily.     . diclofenac (FLECTOR) 1.3 % PTCH Place 1  patch onto the skin as needed.    . diphenoxylate-atropine (LOMOTIL) 2.5-0.025 MG per tablet Take 1 tablet by mouth 4 (four) times daily as needed for diarrhea or loose stools.    . Eluxadoline (VIBERZI) 100 MG TABS Take 100 tablets by mouth 2 (two) times daily.    . furosemide (LASIX) 20 MG tablet Take 1 tablet (20 mg total) by mouth daily. 90 tablet 3  . gabapentin (NEURONTIN) 300 MG capsule two  capsule in the morning and 5 tablets at bedtime 630 capsule 3  . hyoscyamine (LEVBID) 0.375 MG 12 hr tablet Take 0.375 mg by mouth 2 (two) times daily.     [name] leflunomide (ARAVA) 20 MG tablet Take 20 mg by mouth daily.    [name] lidocaine (LIDODERM) 5 % Place 1 patch onto the skin as needed. Remove & Discard patch within 12 hours or as directed by MD    . metoprolol succinate (TOPROL-XL) 25 MG 24 hr tablet Take 1 tablet (25 mg total) by mouth daily. 90 tablet 3  . Multiple Vit-Min-Calcium-FA (FOLGARD OS) 500-1.1 MG TABS Take 1 tablet by mouth 2 (two) times daily.    . pantoprazole (PROTONIX) 40 MG tablet Take 40 mg by mouth daily.    . Probiotic Product (ALIGN) 4 MG CAPS Take 1 capsule by mouth daily.    . Probiotic Product (Empire) 170 MG CAPS Take 1 capsule by mouth every morning.    . promethazine (PHENERGAN) 25 MG tablet Take 25 mg by mouth every 6 (six) hours as needed.      . silver sulfADIAZINE (SILVADENE) 1 % cream Apply 1 application topically as needed.    . Abatacept (ORENCIA) 125 MG/ML SOSY Inject 125 mg into the skin once a week. Reported on 10/26/2015    . methocarbamol (ROBAXIN) 500 MG tablet Take 500 mg by mouth 4 (four) times daily.     Facility-Administered Medications Prior to Visit  Medication Dose Route Frequency Provider Last Rate Last Dose  . chlorhexidine (HIBICLENS) 4 % liquid 4 application  60 mL Topical Once Gaynelle Arabian, MD      . chlorhexidine (HIBICLENS) 4 % liquid 4 application  60 mL Topical Once Gaynelle Arabian, MD        PAST MEDICAL HISTORY: Past Medical History  Diagnosis Date  . Heart murmur     as a child  . Pneumonia     1980's  . Sleep apnea     States SEVERE-on CPAP at 12cm H2O  . Diabetes mellitus     diet controlled  . GERD (gastroesophageal reflux disease)     Barretts esophagitis per pt  . Blood transfusion     1981  . Arthritis     Rheumatoid arthritis,   . Hiatal hernia     sjorgens syndrome  . Fibromyalgia   .  Peripheral autonomic neuropathy of unknown cause   . No pertinent past medical history     [facility] note Dr s.Stoneking, [place] on chart   . Dysrhythmia     /benign PVC's/ EKG 8/12 on  chart  . Diastolic dysfunction   . Polyneuropathy in diabetes(357.2) 02/02/2014    PAST SURGICAL HISTORY: Past Surgical History  Procedure Laterality Date  . Tonsillectomy    . Knee arthroscopy      x2  . Diagnostic laparoscopy      x3  . Tendon repair  1980    left ankle and tibia  . Abdominal hysterectomy  BTL, BSO  . Cholecystectomy    . Tubal ligation    . Patotid cystectomy    . Total knee arthroplasty  06/21/2011    Procedure: TOTAL KNEE ARTHROPLASTY;  Surgeon: Gearlean Alf;  Location: [facility];  Service: Orthopedics;  Laterality: Right;    FAMILY HISTORY: Family History  Problem Relation Age of Onset  . Alzheimer's disease Mother   . Heart attack Mother   . Hypertension Mother   . Glaucoma Mother   . Cancer Father   . Heart attack Brother   . Heart disease Brother   . Glaucoma Brother   . Hyperlipidemia Brother   . Glaucoma Brother     SOCIAL HISTORY: Social History   Social History  . Marital Status: Married    Spouse Name: N/A  . Number of Children: 2  . Years of Education: AS   Occupational History  . disability   . RN    Social History Main Topics  . Smoking status: Never Smoker   . Smokeless tobacco: Never Used  . Alcohol Use: Yes     Comment: less than 1 month  . Drug Use: No  . Sexual Activity: Not on file   Other Topics Concern  . Not on file   Social History Narrative      PHYSICAL EXAM  Filed Vitals:   11/03/15 0834  BP: 131/72  Pulse: 59  Height: 5\' 1"  (1.549 m)  Weight: 242 lb (109.77 kg)   Body mass index is 45.75 kg/(m^2).  Generalized: Well developed, in no acute distress   Neurological examination  Mentation: Alert oriented to time, place, history taking. Follows all commands speech and language fluent Cranial nerve  II-XII: Pupils were equal round reactive to light. Extraocular movements were full, visual field were full on confrontational test. Facial sensation and strength were normal. Uvula tongue midline. Head turning and shoulder shrug  were normal and symmetric. Motor: The motor testing reveals 5 over 5 strength of all 4 extremities. Good symmetric motor tone is noted throughout.  Sensory: Sensory testing is intact to soft touch on all 4 extremities. No evidence of extinction is noted.  Coordination: Cerebellar testing reveals good finger-nose-finger and heel-to-shin bilaterally.  Gait and station: Gait is normal. Tandem gait is normal. Romberg is negative. No drift is seen.  Reflexes: Deep tendon reflexes are symmetric and normal bilaterally.   DIAGNOSTIC DATA (LABS, IMAGING, TESTING) - I reviewed patient records, labs, notes, testing and imaging myself where available.      Component Value Date/Time   NA 140 04/27/2015 1443   K 3.8 04/27/2015 1443   CL 102 04/27/2015 1443   CO2 30 04/27/2015 1443   GLUCOSE 111* 04/27/2015 1443   BUN 16 04/27/2015 1443   CREATININE 0.84 04/27/2015 1443   CALCIUM 9.5 04/27/2015 1443   PROT 6.9 08/11/2014 1059   PROT 6.3 02/02/2014 1521   ALBUMIN 3.9 08/11/2014 1059   AST 24 08/11/2014 1059   ALT 24 08/11/2014 1059   ALKPHOS 65 08/11/2014 1059   BILITOT 0.6 08/11/2014 1059   GFRNONAA >90 06/23/2011 0441   GFRAA >90 06/23/2011 0441       ASSESSMENT AND PLAN 65 y.o. year old female  has a past medical history of Heart murmur; Pneumonia; Sleep apnea; Diabetes mellitus; GERD (gastroesophageal reflux disease); Blood transfusion; Arthritis; Hiatal hernia; Fibromyalgia; Peripheral autonomic neuropathy of unknown cause; No pertinent past medical history; Dysrhythmia; Diastolic dysfunction; and Polyneuropathy in diabetes(357.2) (02/02/2014). here with:  1. Peripheral neuropathy  2. Abnormality of gait  The patient continues to have significant discomfort at  night. She will continue on gabapentin 300 mg 2 tablets in the morning and 5 tablets at bedtime. She did receive some benefit from the compounded cream. I will reorder this. If this does not control her discomfort at night then we May consider increasing the gabapentin or adding on an additional medication. Patient should continue using the Rollator when ambulating. She is advised that if her symptoms worsen or she develops any new symptoms she should let us know. She will follow-up in 6 months with Dr. Jannifer Franklin.  Ward Givens, MSN, NP-C 11/03/2015, 8:37 AM [facility] [address] [place], [place] [address] [phone]

## 2015-11-03 NOTE — Patient Instructions (Signed)
Continue gabapentin  Will order compounded cream for legs If your symptoms worsen or you develop new symptoms please let us know.

## 2015-11-04 ENCOUNTER — Telehealth: Payer: Self-pay | Admitting: Adult Health

## 2015-11-04 MED ORDER — GABAPENTIN 300 MG PO CAPS: ORAL_CAPSULE | ORAL | Status: DC

## 2015-11-04 NOTE — Telephone Encounter (Signed)
Patient needs refill sent to a different pharmacy due to insurance changes.

## 2015-11-04 NOTE — Telephone Encounter (Signed)
Pt called requesting refill for gabapentin (NEURONTIN) 300 MG capsule . Please send to [facility] on [address]. Pt will need this on Saturday

## 2015-11-05 DIAGNOSIS — Z79899 Other long term (current) drug therapy: Secondary | ICD-10-CM | POA: Diagnosis not present

## 2015-11-12 ENCOUNTER — Encounter: Payer: Self-pay | Admitting: Cardiology

## 2015-11-23 ENCOUNTER — Other Ambulatory Visit (HOSPITAL_COMMUNITY): Payer: Self-pay | Admitting: *Deleted

## 2015-11-24 ENCOUNTER — Ambulatory Visit (HOSPITAL_COMMUNITY)
Admission: RE | Admit: 2015-11-24 | Discharge: 2015-11-24 | Disposition: A | Discharge status: Home / Self Care | Payer: PPO | Source: Ambulatory Visit | Attending: Rheumatology | Admitting: Rheumatology

## 2015-11-24 DIAGNOSIS — M069 Rheumatoid arthritis, unspecified: Secondary | ICD-10-CM | POA: Diagnosis not present

## 2015-11-24 LAB — CBC WITH DIFFERENTIAL/PLATELET
BASOS PCT: 1 %
Basophils Absolute: 0 10*3/uL (ref 0.0–0.1)
EOS PCT: 3 %
Eosinophils Absolute: 0.1 10*3/uL (ref 0.0–0.7)
HEMATOCRIT: 40.9 % (ref 36.0–46.0)
HEMOGLOBIN: 13.9 g/dL (ref 12.0–15.0)
LYMPHS ABS: 1.7 10*3/uL (ref 0.7–4.0)
LYMPHS PCT: 35 %
MCH: 29.4 pg (ref 26.0–34.0)
MCHC: 34 g/dL (ref 30.0–36.0)
MCV: 86.7 fL (ref 78.0–100.0)
MONOS PCT: 9 %
Monocytes Absolute: 0.4 10*3/uL (ref 0.1–1.0)
NEUTROS ABS: 2.6 10*3/uL (ref 1.7–7.7)
Neutrophils Relative %: 52 %
Platelets: 185 10*3/uL (ref 150–400)
RBC: 4.72 MIL/uL (ref 3.87–5.11)
RDW: 13.5 % (ref 11.5–15.5)
WBC: 4.8 10*3/uL (ref 4.0–10.5)

## 2015-11-24 LAB — COMPREHENSIVE METABOLIC PANEL
ALK PHOS: 72 U/L (ref 38–126)
ALT: 22 U/L (ref 14–54)
AST: 35 U/L (ref 15–41)
Albumin: 3.5 g/dL (ref 3.5–5.0)
Anion gap: 14 (ref 5–15)
BILIRUBIN TOTAL: 1 mg/dL (ref 0.3–1.2)
BUN: 11 mg/dL (ref 6–20)
CALCIUM: 9 mg/dL (ref 8.9–10.3)
CHLORIDE: 105 mmol/L (ref 101–111)
CO2: 23 mmol/L (ref 22–32)
CREATININE: 0.73 mg/dL (ref 0.44–1.00)
Glucose, Bld: 127 mg/dL — ABNORMAL HIGH (ref 65–99)
Potassium: 4 mmol/L (ref 3.5–5.1)
Sodium: 142 mmol/L (ref 135–145)
Total Protein: 6.7 g/dL (ref 6.5–8.1)

## 2015-11-24 MED ORDER — SODIUM CHLORIDE 0.9 % IV SOLN
INTRAVENOUS | Status: DC
  Administered 2015-11-24 (×2): via INTRAVENOUS

## 2015-11-24 MED ORDER — DIPHENHYDRAMINE HCL 25 MG PO CAPS
25.0000 mg | ORAL_CAPSULE | ORAL | Status: DC
  Administered 2015-11-24: 25 mg via ORAL

## 2015-11-24 MED ORDER — ACETAMINOPHEN 325 MG PO TABS
ORAL_TABLET | ORAL | Status: AC
  Filled 2015-11-24: qty 2

## 2015-11-24 MED ORDER — ACETAMINOPHEN 325 MG PO TABS
650.0000 mg | ORAL_TABLET | ORAL | Status: DC
Start: 2015-11-24 — End: 2015-11-26
  Administered 2015-11-24: 650 mg via ORAL

## 2015-11-24 MED ORDER — DIPHENHYDRAMINE HCL 25 MG PO CAPS
ORAL_CAPSULE | ORAL | Status: AC
  Filled 2015-11-24: qty 1

## 2015-11-24 MED ORDER — SODIUM CHLORIDE 0.9 % IV SOLN
1000.0000 mg | INTRAVENOUS | Status: DC
  Administered 2015-11-24: 1000 mg via INTRAVENOUS
  Filled 2015-11-24: qty 40

## 2015-11-25 ENCOUNTER — Ambulatory Visit: Payer: Medicare Other | Admitting: Adult Health

## 2015-12-07 ENCOUNTER — Other Ambulatory Visit (HOSPITAL_COMMUNITY): Payer: Self-pay | Admitting: *Deleted

## 2015-12-08 ENCOUNTER — Encounter (HOSPITAL_COMMUNITY)
Admission: RE | Admit: 2015-12-08 | Discharge: 2015-12-08 | Disposition: A | Discharge status: Home / Self Care | Payer: PPO | Source: Ambulatory Visit | Attending: Rheumatology | Admitting: Rheumatology

## 2015-12-08 DIAGNOSIS — M069 Rheumatoid arthritis, unspecified: Secondary | ICD-10-CM | POA: Diagnosis not present

## 2015-12-08 MED ORDER — ACETAMINOPHEN 325 MG PO TABS: 650.0000 mg | ORAL_TABLET | ORAL | Status: DC

## 2015-12-08 MED ORDER — SODIUM CHLORIDE 0.9 % IV SOLN
1000.0000 mg | INTRAVENOUS | Status: DC
  Administered 2015-12-08: 1000 mg via INTRAVENOUS
  Filled 2015-12-08: qty 40

## 2015-12-08 MED ORDER — DIPHENHYDRAMINE HCL 25 MG PO CAPS
ORAL_CAPSULE | ORAL | Status: AC
  Filled 2015-12-08: qty 1

## 2015-12-08 MED ORDER — SODIUM CHLORIDE 0.9 % IV SOLN
INTRAVENOUS | Status: DC
  Administered 2015-12-08: 12:00:00 via INTRAVENOUS

## 2015-12-08 MED ORDER — DIPHENHYDRAMINE HCL 25 MG PO CAPS
25.0000 mg | ORAL_CAPSULE | ORAL | Status: DC
  Administered 2015-12-08: 25 mg via ORAL

## 2015-12-31 ENCOUNTER — Other Ambulatory Visit: Payer: Self-pay | Admitting: *Deleted

## 2015-12-31 MED ORDER — METOPROLOL SUCCINATE ER 25 MG PO TB24: 25.0000 mg | ORAL_TABLET | Freq: Every day | ORAL | Status: DC

## 2016-01-03 DIAGNOSIS — Z85828 Personal history of other malignant neoplasm of skin: Secondary | ICD-10-CM | POA: Diagnosis not present

## 2016-01-03 DIAGNOSIS — L570 Actinic keratosis: Secondary | ICD-10-CM | POA: Diagnosis not present

## 2016-01-03 DIAGNOSIS — L821 Other seborrheic keratosis: Secondary | ICD-10-CM | POA: Diagnosis not present

## 2016-01-03 DIAGNOSIS — L820 Inflamed seborrheic keratosis: Secondary | ICD-10-CM | POA: Diagnosis not present

## 2016-01-03 DIAGNOSIS — C44712 Basal cell carcinoma of skin of right lower limb, including hip: Secondary | ICD-10-CM | POA: Diagnosis not present

## 2016-01-03 DIAGNOSIS — D1801 Hemangioma of skin and subcutaneous tissue: Secondary | ICD-10-CM | POA: Diagnosis not present

## 2016-01-03 DIAGNOSIS — D225 Melanocytic nevi of trunk: Secondary | ICD-10-CM | POA: Diagnosis not present

## 2016-01-03 DIAGNOSIS — D2262 Melanocytic nevi of left upper limb, including shoulder: Secondary | ICD-10-CM | POA: Diagnosis not present

## 2016-01-03 DIAGNOSIS — D485 Neoplasm of uncertain behavior of skin: Secondary | ICD-10-CM | POA: Diagnosis not present

## 2016-01-05 ENCOUNTER — Ambulatory Visit (HOSPITAL_COMMUNITY): Payer: PPO

## 2016-01-06 DIAGNOSIS — Z09 Encounter for follow-up examination after completed treatment for conditions other than malignant neoplasm: Secondary | ICD-10-CM | POA: Diagnosis not present

## 2016-01-06 DIAGNOSIS — I7300 Raynaud's syndrome without gangrene: Secondary | ICD-10-CM | POA: Diagnosis not present

## 2016-01-06 DIAGNOSIS — M0579 Rheumatoid arthritis with rheumatoid factor of multiple sites without organ or systems involvement: Secondary | ICD-10-CM | POA: Diagnosis not present

## 2016-01-06 DIAGNOSIS — M797 Fibromyalgia: Secondary | ICD-10-CM | POA: Diagnosis not present

## 2016-01-07 DIAGNOSIS — Z79899 Other long term (current) drug therapy: Secondary | ICD-10-CM | POA: Diagnosis not present

## 2016-01-07 DIAGNOSIS — G4733 Obstructive sleep apnea (adult) (pediatric): Secondary | ICD-10-CM | POA: Diagnosis not present

## 2016-01-07 DIAGNOSIS — R269 Unspecified abnormalities of gait and mobility: Secondary | ICD-10-CM | POA: Diagnosis not present

## 2016-01-07 DIAGNOSIS — Z9225 Personal history of immunosupression therapy: Secondary | ICD-10-CM | POA: Diagnosis not present

## 2016-01-19 ENCOUNTER — Encounter: Payer: Self-pay | Admitting: Family Medicine

## 2016-01-19 ENCOUNTER — Ambulatory Visit (INDEPENDENT_AMBULATORY_CARE_PROVIDER_SITE_OTHER): Payer: PPO | Admitting: Family Medicine

## 2016-01-19 VITALS — BP 112/74 | HR 76 | Ht 60.75 in | Wt 235.7 lb

## 2016-01-19 DIAGNOSIS — M1990 Unspecified osteoarthritis, unspecified site: Secondary | ICD-10-CM

## 2016-01-19 DIAGNOSIS — G629 Polyneuropathy, unspecified: Secondary | ICD-10-CM

## 2016-01-19 DIAGNOSIS — K219 Gastro-esophageal reflux disease without esophagitis: Secondary | ICD-10-CM | POA: Insufficient documentation

## 2016-01-19 DIAGNOSIS — R05 Cough: Secondary | ICD-10-CM | POA: Diagnosis not present

## 2016-01-19 DIAGNOSIS — G4733 Obstructive sleep apnea (adult) (pediatric): Secondary | ICD-10-CM

## 2016-01-19 DIAGNOSIS — R059 Cough, unspecified: Secondary | ICD-10-CM

## 2016-01-19 DIAGNOSIS — R7302 Impaired glucose tolerance (oral): Secondary | ICD-10-CM | POA: Insufficient documentation

## 2016-01-19 DIAGNOSIS — R011 Cardiac murmur, unspecified: Secondary | ICD-10-CM

## 2016-01-19 NOTE — Assessment & Plan Note (Signed)
It is unclear to me whether or not patient truly has diabetes mellitus. Does not appear so as patient states there was just one lone abnormality of her A1c and otherwise all A1c labs have been in the 5.5 range.  MOST RECENT A1c done on 07/29/2015  was 5.4

## 2016-01-19 NOTE — Assessment & Plan Note (Addendum)
Counseling provided. Dietary modifications discussed with patient.     - Recommend bariatric surgery consultation.  Patient states she needs to lose 60 pounds before her insurance will cover it so she declines referral

## 2016-01-19 NOTE — Progress Notes (Signed)
Subjective:    CC: New pt, here to establish care.  - Previously seen by [facility] physicians: Dr. Lajean Manes, last office visit 07/28/2015   HPI:   [name] is a pleasant 65 y.o. female who presents to [place] at [facility] today To become an established patient of ours.  She has an extensive past medical history which I reviewed with her in person today.    Sjogren's syndrome, RA  and FM:  Patient follows with Dr. Patrecia Pour of rheumatology.   Peripheral neuropathy of unknown etiology:   follows with Dr. Jannifer Franklin of neurology. NCV 03/14/2010  PVCs, chronic diastolic CHF:   Dr. Radford Pax of cardiology  OSA:  Wears CPAP nightly, followed by Dr. Radford Pax of cardiology  Barrett's esophagus, GERD, IBS, hiatal hernia:  Dr. Collene Mares of gastroenterology, Had Endo 02//2011 and 05/2012  Urinary incontinence: Dr. Risa Grill  ENT: Dr. Constance Holster  Ophthalmology: Dr. Gershon Crane and Dr. Zigmund Daniel   CC:   Coughing up mucus- thick, mostly white, always with eating- everytime.  At least 2 months.  Sometimes in nite b-4 bed.  More sob with activity then usual.   New dentures so difficult to swallow.    No palpitations/ cp.  everytime she eats- coughs 2-3 times up mucus and then sx are over.  Worst in the evening - at dinner.   Emotionally well: Patient lost her husband this past October 2016 and apparently has been doing quite well with coping. PHQ2 is negative.   Other than losing her medical insurance and now cost of meds is a real issue for her, she has been emotionally stable.    Past Medical History  Diagnosis Date  . Heart murmur     as a child  . Pneumonia     1980's  . Sleep apnea     States SEVERE-on CPAP at 12cm H2O  . Diabetes mellitus     diet controlled  . GERD (gastroesophageal reflux disease)     Barretts esophagitis per pt  . Blood transfusion     1981  . Arthritis     Rheumatoid arthritis,   . Hiatal hernia     sjorgens syndrome  . Fibromyalgia   . Peripheral  autonomic neuropathy of unknown cause   . No pertinent past medical history     [place] note Dr s.Stoneking, [place] on chart   . Dysrhythmia     /benign PVC's/ EKG 8/12 on  chart  . Diastolic dysfunction   . Polyneuropathy in diabetes(357.2) 02/02/2014    Past Surgical History  Procedure Laterality Date  . Tonsillectomy    . Knee arthroscopy      x2  . Diagnostic laparoscopy      x3  . Tendon repair  1980    left ankle and tibia  . Abdominal hysterectomy      BTL, BSO  . Cholecystectomy    . Tubal ligation    . Patotid cystectomy    . Total knee arthroplasty  06/21/2011    Procedure: TOTAL KNEE ARTHROPLASTY;  Surgeon: Gearlean Alf;  Location: [facility];  Service: Orthopedics;  Laterality: Right;    Family History  Problem Relation Age of Onset  . Alzheimer's disease Mother   . Heart attack Mother   . Hypertension Mother   . Glaucoma Mother   . Cancer Father   . Heart attack Brother   . Heart disease Brother   . Glaucoma Brother   . Hyperlipidemia Brother   .  Glaucoma Brother     History  Drug Use No  ,  History  Alcohol Use  . Yes    Comment: less than 1 month  ,  History  Smoking status  . Never Smoker   Smokeless tobacco  . Never Used  ,  History  Sexual Activity  . Sexual Activity: Not on file    Patient's Medications  New Prescriptions   No medications on file  Previous Medications   CYCLOSPORINE (RESTASIS) 0.05 % OPHTHALMIC EMULSION    Place 2 drops into both eyes 2 (two) times daily.    DIPHENOXYLATE-ATROPINE (LOMOTIL) 2.5-0.025 MG PER TABLET    Take 1 tablet by mouth 4 (four) times daily as needed for diarrhea or loose stools.   ELUXADOLINE (VIBERZI) 100 MG TABS    Take 100 tablets by mouth 2 (two) times daily.   FUROSEMIDE (LASIX) 20 MG TABLET    Take 1 tablet (20 mg total) by mouth daily.   GABAPENTIN (NEURONTIN) 300 MG CAPSULE    two capsule in the morning and 5 tablets at bedtime   HYDROCODONE-ACETAMINOPHEN (NORCO/VICODIN) 5-325 MG  TABLET    Take 1 tablet by mouth as needed.   HYOSCYAMINE (LEVBID) 0.375 MG 12 HR TABLET    Take 0.375 mg by mouth 2 (two) times daily.    LEFLUNOMIDE (ARAVA) 20 MG TABLET    Take 20 mg by mouth daily.   LIDOCAINE (LIDODERM) 5 %    Place 1 patch onto the skin as needed. Remove & Discard patch within 12 hours or as directed by MD   METOPROLOL SUCCINATE (TOPROL-XL) 25 MG 24 HR TABLET    Take 1 tablet (25 mg total) by mouth daily.   MULTIPLE VIT-MIN-CALCIUM-FA (FOLGARD OS) 500-1.1 MG TABS    Take 1 tablet by mouth 2 (two) times daily.   PANTOPRAZOLE (PROTONIX) 40 MG TABLET    Take 40 mg by mouth daily.   PROBIOTIC PRODUCT (ALIGN) 4 MG CAPS    Take 1 capsule by mouth daily.   PROBIOTIC PRODUCT (ULTRAFLORA IMMUNE HEALTH) 170 MG CAPS    Take 1 capsule by mouth every morning.   PROMETHAZINE (PHENERGAN) 25 MG TABLET    Take 25 mg by mouth every 6 (six) hours as needed.     SILVER SULFADIAZINE (SILVADENE) 1 % CREAM    Apply 1 application topically as needed.   SULFASALAZINE (AZULFIDINE) 500 MG TABLET    Take 500 mg by mouth 2 (two) times daily.   TIZANIDINE (ZANAFLEX) 4 MG TABLET    Take 1 tablet by mouth as needed.  Modified Medications   No medications on file  Discontinued Medications   DICLOFENAC (FLECTOR) 1.3 % PTCH    Place 1 patch onto the skin as needed.    ALLERGIES: Demerol; Ivp dye; Morphine and related; and Adhesive   Review of Systems: Full 14 point ROS performed via "adult medical history form".  Negative except for noted above and chronic heart palpitations, chronic nausea, chronic problems leaking urine, chronic problem of muscle and joint pain, chronic problem with difficulty of sleep, and chronic cough   Objective:   Blood pressure 112/74, pulse 76, height 5' 0.75" (1.543 m), weight 235 lb 11.2 oz (106.913 kg). Body mass index is 44.91 kg/(m^2).  General:  Developed, well nourished, and in no acute distress.  Neuro: Alert and oriented x3, extra-ocular muscles intact, sensation  grossly intact.  HEENT: Normocephalic, atraumatic, pupils equal round reactive to light, neck supple, no gross masses, No  TM, no carotid bruits, no JVD apprec Skin: no gross suspicious lesions or rashes  Cardiac: Regular rate and rhythm, no murmurs rubs or gallops but difficult to auscultate due to body habitus.  Respiratory: Essentially clear to auscultation bilaterally but distant. Not using accessory muscles, speaking in full sentences.  Abdominal: Soft, morbidly obese. Musculoskeletal: Ambulates w/o diff, FROM * 4 ext.  Vasc: less 2 sec cap RF, warm and pink  Psych:  No HI/SI, judgement and insight good.    Impression and Recommendations:    The patient was counselled, risk factors were discussed, anticipatory guidance given.  Gross side effects, risk and benefits, and alternatives of medications discussed with patient.  Patient is aware that all medications have potential side effects and we are unable to predict every sideeffect or drug-drug interaction that may occur.  Expresses verbal understanding and consents to current therapy plan and treatment regiment.  Note: This document was prepared using Dragon voice recognition software and may include unintentional dictation errors.

## 2016-01-19 NOTE — Assessment & Plan Note (Signed)
Care per neuro, Dr. Jannifer Franklin

## 2016-01-19 NOTE — Assessment & Plan Note (Signed)
Continue CPAP and assessment/treatment per cardiology

## 2016-01-19 NOTE — Assessment & Plan Note (Signed)
Patient understands that I am not sure if her cough is coming from reflux or not but it appears to be related as patient only gets the symptoms with eating or lying down in the evenings.   Keep food and symptom journal and bring in in one month.  Will follow up with Dr. Collene Mares as planned and when necessary

## 2016-01-19 NOTE — Assessment & Plan Note (Signed)
-  It is not clear why patient is coughing up thick mucus 2-3 times whenever she eats.  -She also complains of some nausea from time to time which is chronic in nature. I explained this could be reflux related symptoms.  -I recommend she track her diet along with occurrence of symptoms. -Elevate head of bed -Avoid eating within 3-4 hours of lying down.

## 2016-01-19 NOTE — Patient Instructions (Addendum)
Drink 1/2 weight in ounces of water  Keep a food/ sx diary  Vit D 5,00 IU D3 per day- reck in 91months  Lift head of bed a little higher while sleeping- pt with automatic bed       Cough, Adult Coughing is a reflex that clears your throat and your airways. Coughing helps to heal and protect your lungs. It is normal to cough occasionally, but a cough that happens with other symptoms or lasts a long time may be a sign of a condition that needs treatment. A cough may last only 2-3 weeks (acute), or it may last longer than 8 weeks (chronic). CAUSES Coughing is commonly caused by:  Breathing in substances that irritate your lungs.  A viral or bacterial respiratory infection.  Allergies.  Asthma.  Postnasal drip.  Smoking.  Acid backing up from the stomach into the esophagus (gastroesophageal reflux).  Certain medicines.  Chronic lung problems, including COPD (or rarely, lung cancer).  Other medical conditions such as heart failure. HOME CARE INSTRUCTIONS  Pay attention to any changes in your symptoms. Take these actions to help with your discomfort:  Take medicines only as told by your health care provider.  If you were prescribed an antibiotic medicine, take it as told by your health care provider. Do not stop taking the antibiotic even if you start to feel better.  Talk with your health care provider before you take a cough suppressant medicine.  Drink enough fluid to keep your urine clear or pale yellow.  If the air is dry, use a cold steam vaporizer or humidifier in your bedroom or your home to help loosen secretions.  Avoid anything that causes you to cough at work or at home.  If your cough is worse at night, try sleeping in a semi-upright position.  Avoid cigarette smoke. If you smoke, quit smoking. If you need help quitting, ask your health care provider.  Avoid caffeine.  Avoid alcohol.  Rest as needed. SEEK MEDICAL CARE IF:   You have new  symptoms.  You cough up pus.  Your cough does not get better after 2-3 weeks, or your cough gets worse.  You cannot control your cough with suppressant medicines and you are losing sleep.  You develop pain that is getting worse or pain that is not controlled with pain medicines.  You have a fever.  You have unexplained weight loss.  You have night sweats. SEEK IMMEDIATE MEDICAL CARE IF:  You cough up blood.  You have difficulty breathing.  Your heartbeat is very fast.   This information is not intended to replace advice given to you by your health care provider. Make sure you discuss any questions you have with your health care provider.   Document Released: 01/27/2011 Document Revised: 04/21/2015 Document Reviewed: 10/07/2014 Elsevier Interactive Patient Education 2016 [place] for Gastroesophageal Reflux Disease, Adult When you have gastroesophageal reflux disease (GERD), the foods you eat and your eating habits are very important. Choosing the right foods can help ease the discomfort of GERD. WHAT GENERAL GUIDELINES DO I NEED TO FOLLOW?  Choose fruits, vegetables, whole grains, low-fat dairy products, and low-fat meat, fish, and poultry.  Limit fats such as oils, salad dressings, butter, nuts, and avocado.  Keep a food diary to identify foods that cause symptoms.  Avoid foods that cause reflux. These may be different for different people.  Eat frequent small meals instead of three large meals each  day.  Eat your meals slowly, in a relaxed setting.  Limit fried foods.  Cook foods using methods other than frying.  Avoid drinking alcohol.  Avoid drinking large amounts of liquids with your meals.  Avoid bending over or lying down until 2-3 hours after eating. WHAT FOODS ARE NOT RECOMMENDED? The following are some foods and drinks that may worsen your symptoms: Vegetables Tomatoes. Tomato juice. Tomato and spaghetti sauce. Chili  peppers. Onion and garlic. Horseradish. Fruits Oranges, grapefruit, and lemon (fruit and juice). Meats High-fat meats, fish, and poultry. This includes hot dogs, ribs, ham, sausage, salami, and bacon. Dairy Whole milk and chocolate milk. Sour cream. Cream. Butter. Ice cream. Cream cheese.  Beverages Coffee and tea, with or without caffeine. Carbonated beverages or energy drinks. Condiments Hot sauce. Barbecue sauce.  Sweets/Desserts Chocolate and cocoa. Donuts. Peppermint and spearmint. Fats and Oils High-fat foods, including [place] fries and potato chips. Other Vinegar. Strong spices, such as black pepper, white pepper, red pepper, cayenne, curry powder, cloves, ginger, and chili powder. The items listed above may not be a complete list of foods and beverages to avoid. Contact your dietitian for more information.   This information is not intended to replace advice given to you by your health care provider. Make sure you discuss any questions you have with your health care provider.   Document Released: 07/31/2005 Document Revised: 08/21/2014 Document Reviewed: 06/04/2013 Elsevier Interactive Patient Education [employer].

## 2016-01-19 NOTE — Assessment & Plan Note (Signed)
Patient understands that her being overweight is making her fibromyalgia and chronic joint pains worse 

## 2016-01-24 DIAGNOSIS — R269 Unspecified abnormalities of gait and mobility: Secondary | ICD-10-CM | POA: Diagnosis not present

## 2016-01-24 DIAGNOSIS — G4733 Obstructive sleep apnea (adult) (pediatric): Secondary | ICD-10-CM | POA: Diagnosis not present

## 2016-01-27 ENCOUNTER — Telehealth: Payer: Self-pay | Admitting: Neurology

## 2016-01-27 NOTE — Telephone Encounter (Signed)
Pt called said her feet and legs are burning, shooting pain, hot to touch almost constant increasing over the past couple of months. She has an appt with Dr Jannifer Franklin 9/26, she wants to be seen sooner. She can not come in on Monday 01/31/16, her [facility] is not working and that is the day they are to come. The last appt she had with Jinny Blossom, she advised her it had been so long since she saw Dr Jannifer Franklin she would need to see him the next visit.

## 2016-01-28 NOTE — Telephone Encounter (Signed)
Called both home and cell #s but no answer. Left VM mssg for pt to return call to schedule sooner appt w/ Dr. Jannifer Franklin.

## 2016-01-31 NOTE — Telephone Encounter (Signed)
Patient returned Jennifer's call.

## 2016-01-31 NOTE — Telephone Encounter (Signed)
Returned pt TC. Appt scheduled for next Tues at noon.

## 2016-02-07 NOTE — Telephone Encounter (Signed)
Returned pt TC and appt rescheduled for Thurs @ noon.

## 2016-02-07 NOTE — Telephone Encounter (Addendum)
Pt called in due to having to cancel her 20-Nov-2022 appt . Death in the family. She still needs to come in. Can we work her in?  Please call [phone]

## 2016-02-08 ENCOUNTER — Ambulatory Visit: Payer: Self-pay | Admitting: Neurology

## 2016-02-09 ENCOUNTER — Telehealth: Payer: Self-pay

## 2016-02-09 ENCOUNTER — Encounter: Payer: Self-pay | Admitting: Neurology

## 2016-02-09 ENCOUNTER — Ambulatory Visit (INDEPENDENT_AMBULATORY_CARE_PROVIDER_SITE_OTHER): Payer: PPO | Admitting: Neurology

## 2016-02-09 VITALS — BP 128/70 | HR 60 | Ht 61.0 in | Wt 233.0 lb

## 2016-02-09 DIAGNOSIS — G629 Polyneuropathy, unspecified: Secondary | ICD-10-CM

## 2016-02-09 MED ORDER — CARBAMAZEPINE 200 MG PO TABS: ORAL_TABLET | ORAL | Status: DC

## 2016-02-09 NOTE — Progress Notes (Signed)
Reason for visit: Peripheral neuropathy  [name] is an 64 y.o. female  History of present illness:  [name] is a 65 year old right-handed white female with a history of Sjogren syndrome and rheumatoid arthritis. The patient has a peripheral neuropathy that may be associated with the Sjogren's syndrome. She has been on gabapentin without complete benefit, in the past she has not been able tolerate Cymbalta as she gets "manic" off the medication. She does have some gait instability, she uses a Rollator for ambulation. She has not had any falls since last seen. She believes the symptoms of tingling and sharp lancinating pains have gradually worsened over time, she has difficulty sleeping at night because of it. The symptoms seem to worsen around 4 PM in the evening until around 1 AM. The patient is now having some numbness in the hands. She still is able to operate a motor vehicle. She returns for an evaluation.  Past Medical History  Diagnosis Date  . Heart murmur     as a child  . Blood transfusion     1981  . Arthritis     Rheumatoid arthritis,   . Hiatal hernia     sjorgens syndrome  . Fibromyalgia   . Peripheral autonomic neuropathy of unknown cause     Past Surgical History  Procedure Laterality Date  . Tonsillectomy    . Knee arthroscopy      x2  . Diagnostic laparoscopy      x3  . Tendon repair  1980    left ankle and tibia  . Abdominal hysterectomy      BTL, BSO  . Cholecystectomy    . Tubal ligation    . Patotid cystectomy    . Total knee arthroplasty  06/21/2011    Procedure: TOTAL KNEE ARTHROPLASTY;  Surgeon: Gearlean Alf;  Location: [facility];  Service: Orthopedics;  Laterality: Right;    Family History  Problem Relation Age of Onset  . Alzheimer's disease Mother   . Heart attack Mother   . Hypertension Mother   . Glaucoma Mother   . Cancer Father   . Heart attack Brother   . Heart disease Brother   . Glaucoma Brother   . Hyperlipidemia  Brother   . Glaucoma Brother     Social history:  reports that she has never smoked. She has never used smokeless tobacco. She reports that she drinks alcohol. She reports that she does not use illicit drugs.    Allergies  Allergen Reactions  . Cymbalta [Duloxetine Hcl]     manic  . Demerol Hives and Other (See Comments)    Fever   . Ivp Dye [Iodinated Diagnostic Agents] Hives and Other (See Comments)    Fever   . Morphine And Related Other (See Comments)    ineffective  . Adhesive [Tape] Rash    Medications:  Prior to Admission medications   Medication Sig Start Date End Date Taking? Authorizing Provider  cycloSPORINE (RESTASIS) 0.05 % ophthalmic emulsion Place 2 drops into both eyes 2 (two) times daily.    Yes Historical Provider, MD  diphenoxylate-atropine (LOMOTIL) 2.5-0.025 MG per tablet Take 1 tablet by mouth 4 (four) times daily as needed for diarrhea or loose stools.   Yes Historical Provider, MD  Eluxadoline (VIBERZI) 100 MG TABS Take 100 tablets by mouth 2 (two) times daily.   Yes Historical Provider, MD  furosemide (LASIX) 20 MG tablet Take 1 tablet (20 mg total) by mouth daily.  04/20/15  Yes Sueanne Margarita, MD  gabapentin (NEURONTIN) 300 MG capsule two capsule in the morning and 5 tablets at bedtime 11/04/15  Yes Kathrynn Ducking, MD  HYDROcodone-acetaminophen (NORCO/VICODIN) 5-325 MG tablet Take 1 tablet by mouth as needed. 12/25/15  Yes Historical Provider, MD  hyoscyamine (LEVBID) 0.375 MG 12 hr tablet Take 0.375 mg by mouth 2 (two) times daily.    Yes Historical Provider, MD  leflunomide (ARAVA) 20 MG tablet Take 20 mg by mouth daily.   Yes Historical Provider, MD  lidocaine (LIDODERM) 5 % Place 1 patch onto the skin as needed. Remove & Discard patch within 12 hours or as directed by MD   Yes Historical Provider, MD  metoprolol succinate (TOPROL-XL) 25 MG 24 hr tablet Take 1 tablet (25 mg total) by mouth daily. 12/31/15  Yes Sueanne Margarita, MD  Multiple Vit-Min-Calcium-FA  (FOLGARD OS) 500-1.1 MG TABS Take 1 tablet by mouth 2 (two) times daily.   Yes Historical Provider, MD  pantoprazole (PROTONIX) 40 MG tablet Take 40 mg by mouth daily.   Yes Historical Provider, MD  Probiotic Product (ALIGN) 4 MG CAPS Take 1 capsule by mouth daily.   Yes Historical Provider, MD  Probiotic Product ([place]) 170 MG CAPS Take 1 capsule by mouth every morning.   Yes Historical Provider, MD  promethazine (PHENERGAN) 25 MG tablet Take 25 mg by mouth every 6 (six) hours as needed.     Yes Historical Provider, MD  silver sulfADIAZINE (SILVADENE) 1 % cream Apply 1 application topically as needed.   Yes Historical Provider, MD  sulfaSALAzine (AZULFIDINE) 500 MG EC tablet  01/19/16  Yes Historical Provider, MD  tiZANidine (ZANAFLEX) 4 MG tablet Take 1 tablet by mouth as needed. 01/07/16  Yes Historical Provider, MD    ROS:  Out of a complete 14 system review of symptoms, the patient complains only of the following symptoms, and all other reviewed systems are negative.  Fatigue Runny nose Leg swelling, palpitations of the heart Cold intolerance, heat intolerance Nausea Restless legs, insomnia, sleep apnea Environmental allergies Incontinence of the bladder Joint pain, achy muscles, walking difficulty Swollen lymph nodes Numbness, weakness, tremors  Blood pressure 128/70, pulse 60, height 5\' 1"  (1.549 m), weight 233 lb (105.688 kg).  Physical Exam  General: The patient is alert and cooperative at the time of the examination. The patient is markedly obese.  Skin: 2+ edema below the knees is noted bilaterally.   Neurologic Exam  Mental status: The patient is alert and oriented x 3 at the time of the examination. The patient has apparent normal recent and remote memory, with an apparently normal attention span and concentration ability.   Cranial nerves: Facial symmetry is present. Speech is normal, no aphasia or dysarthria is noted. Extraocular movements are  full. Visual fields are full.  Motor: The patient has good strength in all 4 extremities.  Sensory examination: Soft touch sensation is symmetric on the face, arms, and legs. There is a stocking pattern pinprick sensory deficit up to the knees bilaterally.  Coordination: The patient has good finger-nose-finger and heel-to-shin bilaterally.  Gait and station: The patient has a slightly wide-based, unsteady gait. The patient uses a Rollator for ambulation. Tandem gait was not attempted. Romberg is negative. No drift is seen.  Reflexes: Deep tendon reflexes are symmetric, but are depressed.   Assessment/Plan:  1. Peripheral neuropathy  2. Gait disturbance  3. Sjogren syndrome, rheumatoid arthritis  The patient is followed by Dr.  Deveshwar for the Sjogren syndrome and rheumatoid arthritis. She gets blood work every 3 months or so. She will be started on carbamazepine taking 100 mg twice daily for 3 weeks, then go to 100 mg in the morning and 200 mg in the evening. Hopefully, this may help some of the lancinating pains in the evening hours. The patient will remain on the gabapentin, I do not think that going up on the dose will help her. She will follow-up for her next scheduled revisit in September.  Jill Alexanders MD 02/09/2016 6:42 PM  [facility] [address] [place], [place] [address]  Phone [phone] Fax [phone]

## 2016-02-09 NOTE — Telephone Encounter (Signed)
Pt called earlier to verify appt. Re-scheduled to open appt today.

## 2016-02-11 ENCOUNTER — Ambulatory Visit: Payer: Self-pay | Admitting: Neurology

## 2016-02-22 DIAGNOSIS — Z79899 Other long term (current) drug therapy: Secondary | ICD-10-CM | POA: Diagnosis not present

## 2016-02-22 LAB — HEPATIC FUNCTION PANEL
ALT: 20 U/L (ref 7–35)
AST: 23 U/L (ref 13–35)
Alkaline Phosphatase: 76 U/L (ref 25–125)
BILIRUBIN, TOTAL: 0.5 mg/dL

## 2016-02-22 LAB — CBC AND DIFFERENTIAL
HCT: 40 % (ref 36–46)
HEMOGLOBIN: 13.6 g/dL (ref 12.0–16.0)
PLATELETS: 197 10*3/uL (ref 150–399)
WBC: 5 10*3/mL

## 2016-02-22 LAB — BASIC METABOLIC PANEL
BUN: 11 mg/dL (ref 4–21)
Creatinine: 0.6 mg/dL (ref 0.5–1.1)
GLUCOSE: 132 mg/dL
POTASSIUM: 3.6 mmol/L (ref 3.4–5.3)
Sodium: 141 mmol/L (ref 137–147)

## 2016-02-23 ENCOUNTER — Ambulatory Visit: Payer: PPO | Admitting: Family Medicine

## 2016-02-29 ENCOUNTER — Ambulatory Visit (INDEPENDENT_AMBULATORY_CARE_PROVIDER_SITE_OTHER): Payer: PPO | Admitting: Family Medicine

## 2016-02-29 ENCOUNTER — Encounter: Payer: Self-pay | Admitting: Family Medicine

## 2016-02-29 VITALS — BP 124/74 | HR 82 | Resp 17 | Wt 234.0 lb

## 2016-02-29 DIAGNOSIS — I5032 Chronic diastolic (congestive) heart failure: Secondary | ICD-10-CM | POA: Diagnosis not present

## 2016-02-29 DIAGNOSIS — G629 Polyneuropathy, unspecified: Secondary | ICD-10-CM

## 2016-02-29 DIAGNOSIS — R7302 Impaired glucose tolerance (oral): Secondary | ICD-10-CM

## 2016-02-29 DIAGNOSIS — K219 Gastro-esophageal reflux disease without esophagitis: Secondary | ICD-10-CM | POA: Diagnosis not present

## 2016-02-29 DIAGNOSIS — M3500 Sicca syndrome, unspecified: Secondary | ICD-10-CM | POA: Diagnosis not present

## 2016-02-29 DIAGNOSIS — R05 Cough: Secondary | ICD-10-CM

## 2016-02-29 DIAGNOSIS — Z79899 Other long term (current) drug therapy: Secondary | ICD-10-CM | POA: Insufficient documentation

## 2016-02-29 DIAGNOSIS — R059 Cough, unspecified: Secondary | ICD-10-CM

## 2016-02-29 MED ORDER — POTASSIUM CHLORIDE CRYS ER 10 MEQ PO TBCR: EXTENDED_RELEASE_TABLET | ORAL | Status: DC

## 2016-02-29 NOTE — Patient Instructions (Addendum)
Drink more water and take her potassium daily. Recheck K level needed in 2-3 weeks.  Did d/c pt that she will need a CPE/ yrly with me in near future- obtain A1c etc.  She will look at her records and see when she is due for it.

## 2016-02-29 NOTE — Assessment & Plan Note (Signed)
Continue current medications given to her by GI including Phenergan, Viberzi, probiotics and pantoprazole.  Encouraged patient to continue to keep a diary of food and when she gets symptoms. Told her to bring this to Dr. Lorie Apley office for her per her review at follow-up visit in near future. Explained patient since cough is only related to eating and food intake, Dr. Collene Mares would be the best person to advise further workup at this time we will await her input.

## 2016-02-29 NOTE — Assessment & Plan Note (Addendum)
Dr Jannifer Franklin thinks may be secondary to Sjorens Syn

## 2016-02-29 NOTE — Progress Notes (Signed)
Subjective:    Chief Complaint  Patient presents with  . Cough    HPI: [name] is a 66 y.o. female who presents to [place] at [facility] In follow-up today.   kept food diary- could not id any food that caused sx more than any other.  HOB elevation- no help, And also did not eat within 3-4 hours of lying down and that was little help.Still Coughing when eats- mostly with solids, not with liquids. Occurs Every time.  Sx not W, not better.  Sometimes Nauseated- these sx are nothing new, funny taste in mouth- "weird, sour taste of something."    Has appointmnt with her gastroenterologist Dr. Collene Mares on the 25 th of this month, next week.  Dentures working a little better- staying in.   CHF- sees cards in Sept 25 th.  Denies any new SOB/ CP/palp etc.  Stable.  Less swelling in legs.  She has been on Lasix for many months now and she used to be on potassium supplements and is no longer on them and has questions whether or not she should use them again.  Past Medical History  Diagnosis Date  . Heart murmur     as a child  . Blood transfusion     1981  . Arthritis     Rheumatoid arthritis,   . Hiatal hernia     sjorgens syndrome  . Fibromyalgia   . Peripheral autonomic neuropathy of unknown cause      Past Surgical History  Procedure Laterality Date  . Tonsillectomy    . Knee arthroscopy      x2  . Diagnostic laparoscopy      x3  . Tendon repair  1980    left ankle and tibia  . Abdominal hysterectomy      BTL, BSO  . Cholecystectomy    . Tubal ligation    . Patotid cystectomy    . Total knee arthroplasty  06/21/2011    Procedure: TOTAL KNEE ARTHROPLASTY;  Surgeon: Gearlean Alf;  Location: [facility];  Service: Orthopedics;  Laterality: Right;     Family History  Problem Relation Age of Onset  . Alzheimer's disease Mother   . Heart attack Mother   . Hypertension Mother   . Glaucoma Mother   . Cancer Father   . Heart attack Brother   . Heart  disease Brother   . Glaucoma Brother   . Hyperlipidemia Brother   . Glaucoma Brother      History  Drug Use No  ,  History  Alcohol Use  . Yes    Comment: less than 1 month  ,  History  Smoking status  . Never Smoker   Smokeless tobacco  . Never Used  ,  History  Sexual Activity  . Sexual Activity: Not on file      Current Outpatient Prescriptions on File Prior to Visit  Medication Sig Dispense Refill  . carbamazepine (TEGRETOL) 200 MG tablet 1/2 tablet twice a day for 3 weeks, then take 1/2 tablet in the morning and 1 tablet in the evening 45 tablet 3  . cycloSPORINE (RESTASIS) 0.05 % ophthalmic emulsion Place 2 drops into both eyes 2 (two) times daily.     . diphenoxylate-atropine (LOMOTIL) 2.5-0.025 MG per tablet Take 1 tablet by mouth 4 (four) times daily as needed for diarrhea or loose stools.    . Eluxadoline (VIBERZI) 100 MG TABS Take 100 tablets  by mouth 2 (two) times daily.    . furosemide (LASIX) 20 MG tablet Take 1 tablet (20 mg total) by mouth daily. 90 tablet 3  . gabapentin (NEURONTIN) 300 MG capsule two capsule in the morning and 5 tablets at bedtime 630 capsule 3  . HYDROcodone-acetaminophen (NORCO/VICODIN) 5-325 MG tablet Take 1 tablet by mouth as needed.  0  . hyoscyamine (LEVBID) 0.375 MG 12 hr tablet Take 0.375 mg by mouth 2 (two) times daily.     [name] leflunomide (ARAVA) 20 MG tablet Take 20 mg by mouth daily.    [name] lidocaine (LIDODERM) 5 % Place 1 patch onto the skin as needed. Remove & Discard patch within 12 hours or as directed by MD    . metoprolol succinate (TOPROL-XL) 25 MG 24 hr tablet Take 1 tablet (25 mg total) by mouth daily. 90 tablet 2  . Multiple Vit-Min-Calcium-FA (FOLGARD OS) 500-1.1 MG TABS Take 1 tablet by mouth 2 (two) times daily.    . pantoprazole (PROTONIX) 40 MG tablet Take 40 mg by mouth daily.    . Probiotic Product (ALIGN) 4 MG CAPS Take 1 capsule by mouth daily.    . Probiotic Product ([place]) 170 MG CAPS Take 1  capsule by mouth every morning.    . promethazine (PHENERGAN) 25 MG tablet Take 25 mg by mouth every 6 (six) hours as needed.      . silver sulfADIAZINE (SILVADENE) 1 % cream Apply 1 application topically as needed.    . sulfaSALAzine (AZULFIDINE) 500 MG EC tablet   2  . tiZANidine (ZANAFLEX) 4 MG tablet Take 1 tablet by mouth as needed.  1   Current Facility-Administered Medications on File Prior to Visit  Medication Dose Route Frequency Provider Last Rate Last Dose  . chlorhexidine (HIBICLENS) 4 % liquid 4 application  60 mL Topical Once Gaynelle Arabian, MD      . chlorhexidine (HIBICLENS) 4 % liquid 4 application  60 mL Topical Once Gaynelle Arabian, MD        Allergies  Allergen Reactions  . Cymbalta [Duloxetine Hcl]     manic  . Demerol Hives and Other (See Comments)    Fever   . Ivp Dye [Iodinated Diagnostic Agents] Hives and Other (See Comments)    Fever   . Morphine And Related Other (See Comments)    ineffective  . Adhesive [Tape] Rash      Review of Systems:  ( Completed via adult medical history intake form today ) General:  Denies fever, chills, appetite changes, unexplained weight loss.  Respiratory: Denies SOB, DOE, cough, wheezing.  Cardiovascular: Denies chest pain, palpitations.  Gastrointestinal: Denies nausea, vomiting, diarrhea, abdominal pain.  Genitourinary: Denies dysuria, increased frequency, flank pain. Endocrine: Denies hot or cold intolerance, polyuria, polydipsia. Musculoskeletal: Denies myalgias, back pain, joint swelling, arthralgias, gait problems.  Skin: Denies pallor, rash, suspicious lesions.  Neurological: Denies dizziness, seizures, syncope, unexplained weakness, lightheadedness, numbness and headaches.  Psychiatric/Behavioral: Denies mood changes, suicidal or homicidal ideations, hallucinations, sleep disturbances.    Objective:    Blood pressure 124/74, pulse 82, resp. rate 17, weight 234 lb (106.142 kg), SpO2 96 %. Body mass index is  44.24 kg/(m^2). General: Well Developed, well nourished, and in no acute distress.  HEENT: Normocephalic, atraumatic, pupils equal round reactive to light, neck supple, No carotid bruits no JVD Skin: Warm and dry, cap RF less 2 sec Cardiac: distant but ess regular rate and rhythm, S1, S2 WNL's, no murmurs rubs or gallops  Respiratory: distant but ECTA B/L, Not using accessory muscles, speaking in full sentences. NeuroM-Sk: Ambulates w/o assistance, moves ext * 4, sensation grossly intact.  Psych: A and O *3, judgement and insight good.    Recent Results (from the past 2160 hour(s))  CBC and differential     Status: None   Collection Time: 02/22/16 12:00 AM  Result Value Ref Range   Hemoglobin 13.6 12.0 - 16.0 g/dL   HCT 40 36 - 46 %   Platelets 197 150 - 399 K/L   WBC 5.0 123456  Basic metabolic panel     Status: None   Collection Time: 02/22/16 12:00 AM  Result Value Ref Range   Glucose 132 mg/dL   BUN 11 4 - 21 mg/dL   Creatinine 0.6 0.5 - 1.1 mg/dL   Potassium 3.6 3.4 - 5.3 mmol/L   Sodium 141 137 - 147 mmol/L  Hepatic function panel     Status: None   Collection Time: 02/22/16 12:00 AM  Result Value Ref Range   Alkaline Phosphatase 76 25 - 125 U/L   ALT 20 7 - 35 U/L   AST 23 13 - 35 U/L   Bilirubin, Total 0.5 mg/dL        Impression and Recommendations:    The patient was counseled, risk factors were discussed, anticipatory guidance given.   Peripheral polyneuropathy [facility]) Dr Jannifer Franklin thinks may be secondary to Sjorens Syn  Cough Continue current medications given to her by GI including Phenergan, Viberzi, probiotics and pantoprazole.  Encouraged patient to continue to keep a diary of food and when she gets symptoms. Told her to bring this to Dr. Lorie Apley office for her per her review at follow-up visit in near future. Explained patient since cough is only related to eating and food intake, Dr. Collene Mares would be the best person to advise further workup at this time we  will await her input.  GERD (gastroesophageal reflux disease) Encouraged prudent diet and avoid foods that typically make reflux worse.  Chronic diastolic CHF (congestive heart failure) (HCC) Since patient's potassium is 3.6 and is in low normal range and considering the fact patient has needed potassium supplements in the past, we'll start very low dose. Patient to follow-up with her cardiologist in the near future and will discuss that change with them as well.  Drink more water and take her potassium daily. Recheck K level needed in 2-3 weeks.  Did d/c pt that she will need a CPE/ yrly with me in near future- obtain A1c etc.  She will look at her records and see when she is due for it.   Meds ordered this encounter  Medications  . potassium chloride SA (K-DUR,KLOR-CON) 10 MEQ tablet    Sig: Take daily    Dispense:  30 tablet    Refill:  0    Please see AVS handed out to patient at the end of our visit for further patient instructions/ counseling done pertaining to today's office visit.  Gross side effects, risk and benefits, and alternatives of medications discussed with patient.  Patient is aware that all medications have potential side effects and we are unable to predict every side effect or drug-drug interaction that may occur.  Expresses verbal understanding and consents to current therapy plan and treatment regiment.  Note: This document was prepared using Dragon voice recognition software and may include unintentional dictation errors.

## 2016-02-29 NOTE — Assessment & Plan Note (Signed)
Encouraged prudent diet and avoid foods that typically make reflux worse.

## 2016-02-29 NOTE — Assessment & Plan Note (Signed)
Since patient's potassium is 3.6 and is in low normal range and considering the fact patient has needed potassium supplements in the past, we'll start very low dose. Patient to follow-up with her cardiologist in the near future and will discuss that change with them as well.

## 2016-03-07 ENCOUNTER — Other Ambulatory Visit: Payer: Self-pay | Admitting: Gastroenterology

## 2016-03-07 DIAGNOSIS — R1310 Dysphagia, unspecified: Secondary | ICD-10-CM

## 2016-03-07 DIAGNOSIS — K219 Gastro-esophageal reflux disease without esophagitis: Secondary | ICD-10-CM | POA: Diagnosis not present

## 2016-03-07 DIAGNOSIS — K580 Irritable bowel syndrome with diarrhea: Secondary | ICD-10-CM | POA: Diagnosis not present

## 2016-03-09 ENCOUNTER — Ambulatory Visit: Payer: Self-pay | Admitting: Neurology

## 2016-03-10 ENCOUNTER — Ambulatory Visit
Admission: RE | Admit: 2016-03-10 | Discharge: 2016-03-10 | Disposition: A | Discharge status: Home / Self Care | Payer: PPO | Source: Ambulatory Visit | Attending: Gastroenterology | Admitting: Gastroenterology

## 2016-03-10 DIAGNOSIS — R1310 Dysphagia, unspecified: Secondary | ICD-10-CM | POA: Diagnosis not present

## 2016-03-22 ENCOUNTER — Ambulatory Visit (INDEPENDENT_AMBULATORY_CARE_PROVIDER_SITE_OTHER): Payer: PPO | Admitting: Family Medicine

## 2016-03-22 ENCOUNTER — Encounter: Payer: Self-pay | Admitting: Family Medicine

## 2016-03-22 ENCOUNTER — Other Ambulatory Visit: Payer: Self-pay

## 2016-03-22 VITALS — BP 121/74 | HR 87 | Wt 229.0 lb

## 2016-03-22 DIAGNOSIS — R7302 Impaired glucose tolerance (oral): Secondary | ICD-10-CM | POA: Diagnosis not present

## 2016-03-22 DIAGNOSIS — Z79899 Other long term (current) drug therapy: Secondary | ICD-10-CM | POA: Diagnosis not present

## 2016-03-22 DIAGNOSIS — I5032 Chronic diastolic (congestive) heart failure: Secondary | ICD-10-CM

## 2016-03-22 DIAGNOSIS — E876 Hypokalemia: Secondary | ICD-10-CM | POA: Diagnosis not present

## 2016-03-22 MED ORDER — HYDROXYZINE HCL 50 MG PO TABS: 50.0000 mg | ORAL_TABLET | Freq: Every day | ORAL | 0 refills | Status: DC

## 2016-03-22 MED ORDER — TRIAMCINOLONE ACETONIDE 0.1 % EX OINT: TOPICAL_OINTMENT | CUTANEOUS | 1 refills | Status: DC

## 2016-03-22 NOTE — Progress Notes (Signed)
Impression and Recommendations:    1. Hypokalemia   2. Morbid obesity, unspecified obesity type ([place])   3. Glucose intolerance (impaired glucose tolerance)   4. On potassium wasting diuretic therapy      Obtain K level today depending on results, will adjust potassium dose  Strongly encouraged weight loss. Counseling regarding exercise and nutrition done today.  Reminded patient of prudent diet of low carbohydrate, low fat and high protein. Offered to refer patient to nutritionist which she declined.  Recheck A1c 4 months after last one.  Patient will follow up with her multiple specialists as planned.   Orders Placed This Encounter  Procedures  . Potassium    Patient's Medications  New Prescriptions   HYDROXYZINE (ATARAX/VISTARIL) 50 MG TABLET    Take 1 tablet (50 mg total) by mouth at bedtime. For itching   TRIAMCINOLONE OINTMENT (KENALOG) 0.1 %    To affected areas of itching Three times daily  Previous Medications   CARBAMAZEPINE (TEGRETOL) 200 MG TABLET    1/2 tablet twice a day for 3 weeks, then take 1/2 tablet in the morning and 1 tablet in the evening   CYCLOSPORINE (RESTASIS) 0.05 % OPHTHALMIC EMULSION    Place 2 drops into both eyes 2 (two) times daily.    DIPHENOXYLATE-ATROPINE (LOMOTIL) 2.5-0.025 MG PER TABLET    Take 1 tablet by mouth 4 (four) times daily as needed for diarrhea or loose stools.   FUROSEMIDE (LASIX) 20 MG TABLET    Take 1 tablet (20 mg total) by mouth daily.   GABAPENTIN (NEURONTIN) 300 MG CAPSULE    two capsule in the morning and 5 tablets at bedtime   HYDROCODONE-ACETAMINOPHEN (NORCO/VICODIN) 5-325 MG TABLET    Take 1 tablet by mouth as needed.   HYOSCYAMINE (LEVBID) 0.375 MG 12 HR TABLET    Take 0.375 mg by mouth 2 (two) times daily.    LEFLUNOMIDE (ARAVA) 20 MG TABLET    Take 20 mg by mouth daily.   LIDOCAINE (LIDODERM) 5 %    Place 1 patch onto the skin as needed. Remove & Discard patch within 12 hours or as directed by MD   METOPROLOL  SUCCINATE (TOPROL-XL) 25 MG 24 HR TABLET    Take 1 tablet (25 mg total) by mouth daily.   MULTIPLE VIT-MIN-CALCIUM-FA (FOLGARD OS) 500-1.1 MG TABS    Take 1 tablet by mouth 2 (two) times daily.   PANTOPRAZOLE (PROTONIX) 40 MG TABLET    Take 40 mg by mouth daily.   POTASSIUM CHLORIDE SA (K-DUR,KLOR-CON) 10 MEQ TABLET    Take daily   PROBIOTIC PRODUCT (ALIGN) 4 MG CAPS    Take 1 capsule by mouth daily.   PROBIOTIC PRODUCT (ULTRAFLORA IMMUNE HEALTH) 170 MG CAPS    Take 1 capsule by mouth every morning.   PROMETHAZINE (PHENERGAN) 25 MG TABLET    Take 25 mg by mouth every 6 (six) hours as needed.     SILVER SULFADIAZINE (SILVADENE) 1 % CREAM    Apply 1 application topically as needed.   SULFASALAZINE (AZULFIDINE) 500 MG EC TABLET       TIZANIDINE (ZANAFLEX) 4 MG TABLET    Take 1 tablet by mouth as needed.  Modified Medications   No medications on file  Discontinued Medications   ELUXADOLINE (VIBERZI) 100 MG TABS    Take 100 tablets by mouth 2 (two) times daily.    Return as scheduled already.  The patient was counseled, risk factors were discussed, anticipatory  guidance given.  Gross side effects, risk and benefits, and alternatives of medications discussed with patient.  Patient is aware that all medications have potential side effects and we are unable to predict every side effect or drug-drug interaction that may occur.  Expresses verbal understanding and consents to current therapy plan and treatment regimen.  Please see AVS handed out to patient at the end of our visit for further patient instructions/ counseling done pertaining to today's office visit.    Note: This document was prepared using Dragon voice recognition software and may include unintentional dictation  errors.   --------------------------------------------------------------------------------------------------------------------------------------------------------------------------------------------------------------------------------------------    Subjective:    CC:  Chief Complaint  Patient presents with  . Abnormal Lab    HPI: [name] is a 65 y.o. female who presents to [facility] today for issues as discussed below.   Pt taking the K supp tabs that we started last OV.  Tol well, NO S-E.  Went for barium swallow since last seen.  They think it is Barrett's Esophagitis again.  She has an EGD scheduled with her GI doctor Dr. Collene Mares towards the end of this month. No follow-up office visit with her however. Patient still with chronic cough and sputum production sometimes worse after eating. Recommend patient make follow-up with her.  Wt Readings from Last 3 Encounters:  03/22/16 229 lb (103.9 kg)  02/29/16 234 lb (106.1 kg)  02/09/16 233 lb (105.7 kg)   BP Readings from Last 3 Encounters:  03/22/16 121/74  02/29/16 124/74  02/09/16 128/70   Pulse Readings from Last 3 Encounters:  03/22/16 87  02/29/16 82  02/09/16 60     Patient Active Problem List   Diagnosis Date Noted  . Peripheral polyneuropathy ([place]) 02/02/2014    Priority: High  . Morbid obesity ([place]) 11/17/2013    Priority: High  . Hypokalemia 03/22/2016  . 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 On potassium wasting diuretic therapy 02/29/2016  . GERD (gastroesophageal reflux disease) 01/19/2016  . Cough 01/19/2016  . Glucose intolerance (impaired glucose tolerance) 01/19/2016  . Chronic diastolic CHF (congestive heart failure) ([place]) 04/20/2015  . Heart murmur   . OSA (obstructive sleep apnea)   . Arthritis   . Hiatal hernia   . Fibromyalgia   . Peripheral autonomic neuropathy of unknown cause   . PVC's (premature ventricular contractions)   . Status post total knee  replacement 06/24/2011  . Osteoarthritis of right knee 06/22/2011    Past Medical history, Surgical history, Family history, Social history, Allergies and Medications have been entered into the medical record, reviewed and changed as needed.   Allergies:  Allergies  Allergen Reactions  . Cymbalta [Duloxetine Hcl]     manic  . Demerol Hives and Other (See Comments)    Fever   . Ivp Dye [Iodinated Diagnostic Agents] Hives and Other (See Comments)    Fever   . Morphine And Related Other (See Comments)    ineffective  . Adhesive [Tape] Rash    Review of Systems: No fever/ chills, night sweats, no unintended weight loss, No chest pain, or increased shortness of breath. No N/V/D.  Pertinent positives and negatives noted in HPI above    Objective:   Blood pressure 121/74, pulse 87, weight 229 lb (103.9 kg), SpO2 95 %. Body mass index is 43.27 kg/m.  General: Well Developed, well nourished, appropriate for stated age.  Neuro: Alert and oriented x3, extra-ocular muscles intact, sensation grossly intact.  HEENT:  Normocephalic, atraumatic, neck supple   Skin: Warm and dry, no gross rash. Cardiac: RRR, S1 S2,  no murmurs rubs or gallops.  Respiratory: ECTA B/L, Not using accessory muscles, speaking in full sentences-unlabored. Vascular:  No gross lower ext edema, cap RF less 2 sec. Psych: No SI/HI, Insight and judgement good

## 2016-03-22 NOTE — Patient Instructions (Signed)
We will see you in October for your complete physical exam. Please make sure that's okay with your insurance company.        Mediterranean Diet  Why follow it? Research shows. . Those who follow the Mediterranean diet have a reduced risk of heart disease  . The diet is associated with a reduced incidence of Parkinson's and Alzheimer's diseases . People following the diet may have longer life expectancies and lower rates of chronic diseases  . The Dietary Guidelines for Americans recommends the Mediterranean diet as an eating plan to promote health and prevent disease  What Is the Mediterranean Diet?  . Healthy eating plan based on typical foods and recipes of Mediterranean-style cooking . The diet is primarily a plant based diet; these foods should make up a majority of meals   Starches - Plant based foods should make up a majority of meals - They are an important sources of vitamins, minerals, energy, antioxidants, and fiber - Choose whole grains, foods high in fiber and minimally processed items  - Typical grain sources include wheat, oats, barley, corn, brown rice, bulgar, farro, millet, polenta, couscous  - Various types of beans include chickpeas, lentils, fava beans, black beans, white beans   Fruits  Veggies - Large quantities of antioxidant rich fruits & veggies; 6 or more servings  - Vegetables can be eaten raw or lightly drizzled with oil and cooked  - Vegetables common to the traditional Mediterranean Diet include: artichokes, arugula, beets, broccoli, brussel sprouts, cabbage, carrots, celery, collard greens, cucumbers, eggplant, kale, leeks, lemons, lettuce, mushrooms, okra, onions, peas, peppers, potatoes, pumpkin, radishes, rutabaga, shallots, spinach, sweet potatoes, turnips, zucchini - Fruits common to the Mediterranean Diet include: apples, apricots, avocados, cherries, clementines, dates, figs, grapefruits, grapes, melons, nectarines, oranges, peaches, pears,  pomegranates, strawberries, tangerines  Fats - Replace butter and margarine with healthy oils, such as olive oil, canola oil, and tahini  - Limit nuts to no more than a handful a day  - Nuts include walnuts, almonds, pecans, pistachios, pine nuts  - Limit or avoid candied, honey roasted or heavily salted nuts - Olives are central to the [employer] - can be eaten whole or used in a variety of dishes   Meats Protein - Limiting red meat: no more than a few times a month - When eating red meat: choose lean cuts and keep the portion to the size of deck of cards - Eggs: approx. 0 to 4 times a week  - Fish and lean poultry: at least 2 a week  - Healthy protein sources include, chicken, [place], lean beef, lamb - Increase intake of seafood such as tuna, salmon, trout, mackerel, shrimp, scallops - Avoid or limit high fat processed meats such as sausage and bacon  Dairy - Include moderate amounts of low fat dairy products  - Focus on healthy dairy such as fat free yogurt, skim milk, low or reduced fat cheese - Limit dairy products higher in fat such as whole or 2% milk, cheese, ice cream  Alcohol - Moderate amounts of red wine is ok  - No more than 5 oz daily for women (all ages) and men older than age 69  - No more than 10 oz of wine daily for men younger than 30  Other - Limit sweets and other desserts  - Use herbs and spices instead of salt to flavor foods  - Herbs and spices common to the traditional Mediterranean Diet include: basil, bay leaves, chives, cloves, cumin,  fennel, garlic, lavender, marjoram, mint, oregano, parsley, pepper, rosemary, sage, savory, sumac, tarragon, thyme   It's not just a diet, it's a lifestyle:  . The Mediterranean diet includes lifestyle factors typical of those in the region  . Foods, drinks and meals are best eaten with others and savored . Daily physical activity is important for overall good health . This could be strenuous exercise like running and  aerobics . This could also be more leisurely activities such as walking, housework, yard-work, or taking the stairs . Moderation is the key; a balanced and healthy diet accommodates most foods and drinks . Consider portion sizes and frequency of consumption of certain foods   Meal Ideas & Options:  . Breakfast:  o Whole wheat toast or whole wheat English muffins with peanut butter & hard boiled egg o Steel cut oats topped with apples & cinnamon and skim milk  o Fresh fruit: banana, strawberries, melon, berries, peaches  o Smoothies: strawberries, bananas, greek yogurt, peanut butter o Low fat greek yogurt with blueberries and granola  o Egg white omelet with spinach and mushrooms o Breakfast couscous: whole wheat couscous, apricots, skim milk, cranberries  . Sandwiches:  o Hummus and grilled vegetables (peppers, zucchini, squash) on whole wheat bread   o Grilled chicken on whole wheat pita with lettuce, tomatoes, cucumbers or tzatziki  o Tuna salad on whole wheat bread: tuna salad made with greek yogurt, olives, red peppers, capers, green onions o Garlic rosemary lamb pita: lamb sauted with garlic, rosemary, salt & pepper; add lettuce, cucumber, greek yogurt to pita - flavor with lemon juice and black pepper  . Seafood:  o Mediterranean grilled salmon, seasoned with garlic, basil, parsley, lemon juice and black pepper o Shrimp, lemon, and spinach whole-grain pasta salad made with low fat greek yogurt  o Seared scallops with lemon orzo  o Seared tuna steaks seasoned salt, pepper, coriander topped with tomato mixture of olives, tomatoes, olive oil, minced garlic, parsley, green onions and cappers  . Meats:  o Herbed greek chicken salad with kalamata olives, cucumber, feta  o Red bell peppers stuffed with spinach, bulgur, lean ground beef (or lentils) & topped with feta   o Kebabs: skewers of chicken, tomatoes, onions, zucchini, squash  o [place] burgers: made with red onions, mint, dill,  lemon juice, feta cheese topped with roasted red peppers . Vegetarian o Cucumber salad: cucumbers, artichoke hearts, celery, red onion, feta cheese, tossed in olive oil & lemon juice  o Hummus and whole grain pita points with a greek salad (lettuce, tomato, feta, olives, cucumbers, red onion) o Lentil soup with celery, carrots made with vegetable broth, garlic, salt and pepper  o Tabouli salad: parsley, bulgur, mint, scallions, cucumbers, tomato, radishes, lemon juice, olive oil, salt and pepper.  Exercising to Lose Weight Exercising can help you to lose weight. In order to lose weight through exercise, you need to do vigorous-intensity exercise. You can tell that you are exercising with vigorous intensity if you are breathing very hard and fast and cannot hold a conversation while exercising. Moderate-intensity exercise helps to maintain your current weight. You can tell that you are exercising at a moderate level if you have a higher heart rate and faster breathing, but you are still able to hold a conversation. HOW OFTEN SHOULD I EXERCISE? Choose an activity that you enjoy and set realistic goals. Your health care provider can help you to make an activity plan that works for you. Exercise regularly as directed by  your health care provider. This may include:  Doing resistance training twice each week, such as:  Push-ups.  Sit-ups.  Lifting weights.  Using resistance bands.  Doing a given intensity of exercise for a given amount of time. Choose from these options:  150 minutes of moderate-intensity exercise every week.  75 minutes of vigorous-intensity exercise every week.  A mix of moderate-intensity and vigorous-intensity exercise every week. Children, pregnant women, people who are out of shape, people who are overweight, and older adults may need to consult a health care provider for individual recommendations. If you have any sort of medical condition, be sure to consult your  health care provider before starting a new exercise program. WHAT ARE SOME ACTIVITIES THAT CAN HELP ME TO LOSE WEIGHT?   Walking at a rate of at least 4.5 miles an hour.  Jogging or running at a rate of 5 miles per hour.  Biking at a rate of at least 10 miles per hour.  Lap swimming.  Roller-skating or in-line skating.  Cross-country skiing.  Vigorous competitive sports, such as football, basketball, and soccer.  Jumping rope.  Aerobic dancing. HOW CAN I BE MORE ACTIVE IN MY DAY-TO-DAY ACTIVITIES?  Use the stairs instead of the elevator.  Take a walk during your lunch break.  If you drive, park your car farther away from work or school.  If you take public transportation, get off one stop early and walk the rest of the way.  Make all of your phone calls while standing up and walking around.  Get up, stretch, and walk around every 30 minutes throughout the day. WHAT GUIDELINES SHOULD I FOLLOW WHILE EXERCISING?  Do not exercise so much that you hurt yourself, feel dizzy, or get very short of breath.  Consult your health care provider prior to starting a new exercise program.  Wear comfortable clothes and shoes with good support.  Drink plenty of water while you exercise to prevent dehydration or heat stroke. Body water is lost during exercise and must be replaced.  Work out until you breathe faster and your heart beats faster.   This information is not intended to replace advice given to you by your health care provider. Make sure you discuss any questions you have with your health care provider.   Document Released: 09/02/2010 Document Revised: 08/21/2014 Document Reviewed: 01/01/2014 Elsevier Interactive Patient Education [employer].

## 2016-03-23 LAB — POTASSIUM: POTASSIUM: 3.7 mmol/L (ref 3.5–5.3)

## 2016-03-24 ENCOUNTER — Other Ambulatory Visit: Payer: Self-pay

## 2016-03-24 MED ORDER — POTASSIUM CHLORIDE CRYS ER 10 MEQ PO TBCR
EXTENDED_RELEASE_TABLET | ORAL | 2 refills | Status: DC
Start: 2016-03-24 — End: 2016-07-18

## 2016-04-07 DIAGNOSIS — K219 Gastro-esophageal reflux disease without esophagitis: Secondary | ICD-10-CM | POA: Diagnosis not present

## 2016-04-07 DIAGNOSIS — K2270 Barrett's esophagus without dysplasia: Secondary | ICD-10-CM | POA: Diagnosis not present

## 2016-04-07 DIAGNOSIS — K200 Eosinophilic esophagitis: Secondary | ICD-10-CM | POA: Diagnosis not present

## 2016-04-07 DIAGNOSIS — K210 Gastro-esophageal reflux disease with esophagitis: Secondary | ICD-10-CM | POA: Diagnosis not present

## 2016-04-07 DIAGNOSIS — K228 Other specified diseases of esophagus: Secondary | ICD-10-CM | POA: Diagnosis not present

## 2016-04-07 DIAGNOSIS — R1310 Dysphagia, unspecified: Secondary | ICD-10-CM | POA: Diagnosis not present

## 2016-04-12 DIAGNOSIS — M79642 Pain in left hand: Secondary | ICD-10-CM | POA: Diagnosis not present

## 2016-04-12 DIAGNOSIS — R5383 Other fatigue: Secondary | ICD-10-CM | POA: Diagnosis not present

## 2016-04-12 DIAGNOSIS — M797 Fibromyalgia: Secondary | ICD-10-CM | POA: Diagnosis not present

## 2016-04-12 DIAGNOSIS — M79641 Pain in right hand: Secondary | ICD-10-CM | POA: Diagnosis not present

## 2016-04-28 ENCOUNTER — Other Ambulatory Visit: Payer: Self-pay | Admitting: Family Medicine

## 2016-04-28 DIAGNOSIS — Z1231 Encounter for screening mammogram for malignant neoplasm of breast: Secondary | ICD-10-CM

## 2016-05-02 DIAGNOSIS — K2270 Barrett's esophagus without dysplasia: Secondary | ICD-10-CM | POA: Diagnosis not present

## 2016-05-02 DIAGNOSIS — K580 Irritable bowel syndrome with diarrhea: Secondary | ICD-10-CM | POA: Diagnosis not present

## 2016-05-02 DIAGNOSIS — K219 Gastro-esophageal reflux disease without esophagitis: Secondary | ICD-10-CM | POA: Diagnosis not present

## 2016-05-08 ENCOUNTER — Ambulatory Visit (INDEPENDENT_AMBULATORY_CARE_PROVIDER_SITE_OTHER): Payer: PPO | Admitting: Cardiology

## 2016-05-08 ENCOUNTER — Encounter: Payer: Self-pay | Admitting: Cardiology

## 2016-05-08 VITALS — BP 120/84 | HR 80 | Ht 61.0 in | Wt 224.4 lb

## 2016-05-08 DIAGNOSIS — G4733 Obstructive sleep apnea (adult) (pediatric): Secondary | ICD-10-CM | POA: Diagnosis not present

## 2016-05-08 DIAGNOSIS — I493 Ventricular premature depolarization: Secondary | ICD-10-CM

## 2016-05-08 DIAGNOSIS — I5032 Chronic diastolic (congestive) heart failure: Secondary | ICD-10-CM | POA: Diagnosis not present

## 2016-05-08 NOTE — Patient Instructions (Signed)

## 2016-05-08 NOTE — Progress Notes (Signed)
Cardiology Office Note    Date:  05/08/2016   ID:  [name], DOB [birth year], MRN [id number]  PCP:  Mellody Dance, DO  Cardiologist:  Fransico Him, MD   Chief Complaint  Patient presents with  . Sleep Apnea  . Hypertension  . Congestive Heart Failure    History of Present Illness:  [name] is a 65 y.o. female  with a history of PVC's, chronic diastolic CHF, obesity and OSA who presents today for followup. She is doing well. She denies any palpitations. She tolerates her CPAP without any problems.  She tolerates the full face mask and feels the pressure is adequate.She denies any chest pain, dizziness or syncope. She has chronic PVC's with she feels but they do not bother her. She has chronic DOE and mild LE edema but these are stable.    She goes to sleep without any problems and sleeps well at night.     Past Medical History:  Diagnosis Date  . Arthritis    Rheumatoid arthritis,   . Blood transfusion    1981  . Fibromyalgia   . Heart murmur    as a child  . Hiatal hernia    sjorgens syndrome  . Peripheral autonomic neuropathy of unknown cause     Past Surgical History:  Procedure Laterality Date  . ABDOMINAL HYSTERECTOMY     BTL, BSO  . CHOLECYSTECTOMY    . DIAGNOSTIC LAPAROSCOPY     x3  . KNEE ARTHROSCOPY     x2  . patotid cystectomy    . TENDON REPAIR  1980   left ankle and tibia  . TONSILLECTOMY    . TOTAL KNEE ARTHROPLASTY  06/21/2011   Procedure: TOTAL KNEE ARTHROPLASTY;  Surgeon: Gearlean Alf;  Location: [facility];  Service: Orthopedics;  Laterality: Right;  . TUBAL LIGATION      Current Medications: Outpatient Medications Prior to Visit  Medication Sig Dispense Refill  . carbamazepine (TEGRETOL) 200 MG tablet 1/2 tablet twice a day for 3 weeks, then take 1/2 tablet in the morning and 1 tablet in the evening 45 tablet 3  . cycloSPORINE (RESTASIS) 0.05 % ophthalmic emulsion Place 2 drops into both eyes 2 (two) times daily.     .  diphenoxylate-atropine (LOMOTIL) 2.5-0.025 MG per tablet Take 1 tablet by mouth 4 (four) times daily as needed for diarrhea or loose stools.    . furosemide (LASIX) 20 MG tablet Take 1 tablet (20 mg total) by mouth daily. 90 tablet 3  . gabapentin (NEURONTIN) 300 MG capsule two capsule in the morning and 5 tablets at bedtime 630 capsule 3  . HYDROcodone-acetaminophen (NORCO/VICODIN) 5-325 MG tablet Take 1 tablet by mouth as needed.  0  . hydrOXYzine (ATARAX/VISTARIL) 50 MG tablet Take 1 tablet (50 mg total) by mouth at bedtime. For itching 30 tablet 0  . hyoscyamine (LEVBID) 0.375 MG 12 hr tablet Take 0.375 mg by mouth daily.     [name] leflunomide (ARAVA) 20 MG tablet Take 20 mg by mouth daily.    [name] lidocaine (LIDODERM) 5 % Place 1 patch onto the skin as needed. Remove & Discard patch within 12 hours or as directed by MD    . metoprolol succinate (TOPROL-XL) 25 MG 24 hr tablet Take 1 tablet (25 mg total) by mouth daily. 90 tablet 2  . Multiple Vit-Min-Calcium-FA (FOLGARD OS) 500-1.1 MG TABS Take 1 tablet by mouth 2 (two) times daily.    . pantoprazole (PROTONIX) 40 MG  tablet Take 40 mg by mouth daily.    . potassium chloride (K-DUR,KLOR-CON) 10 MEQ tablet Take daily 30 tablet 2  . Probiotic Product (ALIGN) 4 MG CAPS Take 1 capsule by mouth daily.    . Probiotic Product ([place]) 170 MG CAPS Take 1 capsule by mouth every morning.    . promethazine (PHENERGAN) 25 MG tablet Take 25 mg by mouth every 6 (six) hours as needed.      . silver sulfADIAZINE (SILVADENE) 1 % cream Apply 1 application topically as needed.    . sulfaSALAzine (AZULFIDINE) 500 MG EC tablet   2  . tiZANidine (ZANAFLEX) 4 MG tablet Take 1 tablet by mouth as needed.  1  . triamcinolone ointment (KENALOG) 0.1 % To affected areas of itching Three times daily 80 g 1   Facility-Administered Medications Prior to Visit  Medication Dose Route Frequency Provider Last Rate Last Dose  . chlorhexidine (HIBICLENS) 4 % liquid 4  application  60 mL Topical Once Gaynelle Arabian, MD      . chlorhexidine (HIBICLENS) 4 % liquid 4 application  60 mL Topical Once Gaynelle Arabian, MD         Allergies:   Cymbalta [duloxetine hcl]; Demerol; Ivp dye [iodinated diagnostic agents]; Morphine and related; and Adhesive [tape]   Social History   Social History  . Marital status: Married    Spouse name: N/A  . Number of children: 2  . Years of education: AS   Occupational History  . disability   . RN    Social History Main Topics  . Smoking status: Never Smoker  . Smokeless tobacco: Never Used  . Alcohol use Yes     Comment: less than 1 month  . Drug use: No  . Sexual activity: Not Asked   Other Topics Concern  . None   Social History Narrative   Lives at home alone   Right-handed   Drinks 1 or less cups of coffee and 2 servings of either tea or soda per day     Family History:  The patient's family history includes Alzheimer's disease in her mother; Cancer in her father; Glaucoma in her brother, brother, and mother; Heart attack in her brother and mother; Heart disease in her brother; Hyperlipidemia in her brother; Hypertension in her mother.   ROS:   Please see the history of present illness.    ROS All other systems reviewed and are negative.  No flowsheet data found.     PHYSICAL EXAM:   VS:  BP 120/84   Pulse 80   Ht 5\' 1"  (1.549 m)   Wt 224 lb 6.4 oz (101.8 kg)   SpO2 96%   BMI 42.40 kg/m    GEN: Well nourished, well developed, in no acute distress  HEENT: normal  Neck: no JVD, carotid bruits, or masses Cardiac: RRR; no murmurs, rubs, or gallops,no edema.  Intact distal pulses bilaterally.  Respiratory:  clear to auscultation bilaterally, normal work of breathing GI: soft, nontender, nondistended, + BS MS: no deformity or atrophy  Skin: warm and dry, no rash Neuro:  Alert and Oriented x 3, Strength and sensation are intact Psych: euthymic mood, full affect  Wt Readings from Last 3  Encounters:  05/08/16 224 lb 6.4 oz (101.8 kg)  03/22/16 229 lb (103.9 kg)  02/29/16 234 lb (106.1 kg)      Studies/Labs Reviewed:   EKG:  EKG is not ordered today.    Recent Labs: 02/22/2016: ALT 20;  BUN 11; Creatinine 0.6; Hemoglobin 13.6; Platelets 197; Sodium 141 03/22/2016: Potassium 3.7   Lipid Panel No results found for: CHOL, TRIG, HDL, CHOLHDL, VLDL, LDLCALC, LDLDIRECT  Additional studies/ records that were reviewed today include:  CPAP d/l    ASSESSMENT:    1. OSA (obstructive sleep apnea)   2. Morbid obesity, unspecified obesity type ([place])   3. Chronic diastolic CHF (congestive heart failure) ([place])   4. PVC's (premature ventricular contractions)      PLAN:  In order of problems listed above:  OSA - the patient is tolerating PAP therapy well without any problems. The patient has been using and benefiting from CPAP use and will continue to benefit from therapy.  Her machine is old and it is now not working.  She would like to get a new machine.  I will order her a new one and then get a download. 2.   I have encouraged him to get into a routine exercise program and cut back on carbs and portions but exercise is limited by her arthritic problems.  3.   Chronic diastolic CHF - she appears euvolemic on exam. Continue diuretic and BB.  4.   PVCs - fairly well suppressed on BB.      Medication Adjustments/Labs and Tests Ordered: Current medicines are reviewed at length with the patient today.  Concerns regarding medicines are outlined above.  Medication changes, Labs and Tests ordered today are listed in the Patient Instructions below.  There are no Patient Instructions on file for this visit.   Signed, Fransico Him, MD  05/08/2016 1:38 PM    [facility] [place], [place], [place]  [address] Phone: [phone]; Fax: [phone]

## 2016-05-09 ENCOUNTER — Ambulatory Visit (INDEPENDENT_AMBULATORY_CARE_PROVIDER_SITE_OTHER): Payer: PPO | Admitting: Neurology

## 2016-05-09 ENCOUNTER — Encounter: Payer: Self-pay | Admitting: Neurology

## 2016-05-09 VITALS — BP 115/74 | HR 76 | Ht 61.0 in | Wt 224.0 lb

## 2016-05-09 DIAGNOSIS — G629 Polyneuropathy, unspecified: Secondary | ICD-10-CM

## 2016-05-09 DIAGNOSIS — R269 Unspecified abnormalities of gait and mobility: Secondary | ICD-10-CM | POA: Insufficient documentation

## 2016-05-09 DIAGNOSIS — Z5181 Encounter for therapeutic drug level monitoring: Secondary | ICD-10-CM | POA: Diagnosis not present

## 2016-05-09 MED ORDER — CARBAMAZEPINE 200 MG PO TABS
200.0000 mg | ORAL_TABLET | Freq: Two times a day (BID) | ORAL | 5 refills | Status: DC
Start: 2016-05-09 — End: 2016-11-07

## 2016-05-09 NOTE — Patient Instructions (Addendum)
Fall Prevention in the Home  Falls can cause injuries and can affect people from all age groups. There are many simple things that you can do to make your home safe and to help prevent falls. WHAT CAN I DO ON THE OUTSIDE OF MY HOME?  Regularly repair the edges of walkways and driveways and fix any cracks.  Remove high doorway thresholds.  Trim any shrubbery on the main path into your home.  Use bright outdoor lighting.  Clear walkways of debris and clutter, including tools and rocks.  Regularly check that handrails are securely fastened and in good repair. Both sides of any steps should have handrails.  Install guardrails along the edges of any raised decks or porches.  Have leaves, snow, and ice cleared regularly.  Use sand or salt on walkways during winter months.  In the garage, clean up any spills right away, including grease or oil spills. WHAT CAN I DO IN THE BATHROOM?  Use night lights.  Install grab bars by the toilet and in the tub and shower. Do not use towel bars as grab bars.  Use non-skid mats or decals on the floor of the tub or shower.  If you need to sit down while you are in the shower, use a plastic, non-slip stool..  Keep the floor dry. Immediately clean up any water that spills on the floor.  Remove soap buildup in the tub or shower on a regular basis.  Attach bath mats securely with double-sided non-slip rug tape.  Remove throw rugs and other tripping hazards from the floor. WHAT CAN I DO IN THE BEDROOM?  Use night lights.  Make sure that a bedside light is easy to reach.  Do not use oversized bedding that drapes onto the floor.  Have a firm chair that has side arms to use for getting dressed.  Remove throw rugs and other tripping hazards from the floor. WHAT CAN I DO IN THE KITCHEN?   Clean up any spills right away.  Avoid walking on wet floors.  Place frequently used items in easy-to-reach places.  If you need to reach for something  above you, use a sturdy step stool that has a grab bar.  Keep electrical cables out of the way.  Do not use floor polish or wax that makes floors slippery. If you have to use wax, make sure that it is non-skid floor wax.  Remove throw rugs and other tripping hazards from the floor. WHAT CAN I DO IN THE STAIRWAYS?  Do not leave any items on the stairs.  Make sure that there are handrails on both sides of the stairs. Fix handrails that are broken or loose. Make sure that handrails are as long as the stairways.  Check any carpeting to make sure that it is firmly attached to the stairs. Fix any carpet that is loose or worn.  Avoid having throw rugs at the top or bottom of stairways, or secure the rugs with carpet tape to prevent them from moving.  Make sure that you have a light switch at the top of the stairs and the bottom of the stairs. If you do not have them, have them installed. WHAT ARE SOME OTHER FALL PREVENTION TIPS?  Wear closed-toe shoes that fit well and support your feet. Wear shoes that have rubber soles or low heels.  When you use a stepladder, make sure that it is completely opened and that the sides are firmly locked. Have someone hold the ladder while you   are using it. Do not climb a closed stepladder.  Add color or contrast paint or tape to grab bars and handrails in your home. Place contrasting color strips on the first and last steps.  Use mobility aids as needed, such as canes, walkers, scooters, and crutches.  Turn on lights if it is dark. Replace any light bulbs that burn out.  Set up furniture so that there are clear paths. Keep the furniture in the same spot.  Fix any uneven floor surfaces.  Choose a carpet design that does not hide the edge of steps of a stairway.  Be aware of any and all pets.  Review your medicines with your healthcare provider. Some medicines can cause dizziness or changes in blood pressure, which increase your risk of falling. Talk  with your health care provider about other ways that you can decrease your risk of falls. This may include working with a physical therapist or trainer to improve your strength, balance, and endurance.   This information is not intended to replace advice given to you by your health care provider. Make sure you discuss any questions you have with your health care provider.   Document Released: 07/21/2002 Document Revised: 12/15/2014 Document Reviewed: 09/04/2014 Elsevier Interactive Patient Education 2016 [employer]vier Inc.  

## 2016-05-09 NOTE — Progress Notes (Signed)
Reason for visit: Peripheral neuropathy  [name] is an 65 y.o. female  History of present illness:  [name] is a 65 year old right-handed white female with a history of morbid obesity, rheumatoid arthritis, and Sjogren syndrome. 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 She walks with a walker, she has not had any falls since last seen. [name] is sleeping well, she uses Zanaflex at night. [name] has had some lancinating pains with her neuropathy, Marisa use of carbamazepine has been helpful. [name] has been able lose 20 pounds since last seen, she reports that carbamazepine has suppressed her appetite. [name] has Marisa most discomfort between 6 PM and 12 at night. She is having increased problems with arthritis pain. She returns to this office for an evaluation. She takes gabapentin as well for her neuropathy.  Past Medical History:  Diagnosis Date  . Arthritis    Rheumatoid arthritis,   . Blood transfusion    1981  . Fibromyalgia   . Heart murmur    as a child  . Hiatal hernia    sjorgens syndrome  . Peripheral autonomic neuropathy of unknown cause     Past Surgical History:  Procedure Laterality Date  . ABDOMINAL HYSTERECTOMY     BTL, BSO  . CHOLECYSTECTOMY    . DIAGNOSTIC LAPAROSCOPY     x3  . KNEE ARTHROSCOPY     x2  . patotid cystectomy    . TENDON REPAIR  1980   left ankle and tibia  . TONSILLECTOMY    . TOTAL KNEE ARTHROPLASTY  06/21/2011   Procedure: TOTAL KNEE ARTHROPLASTY;  Surgeon: Gearlean Alf;  Location: [facility];  Service: Orthopedics;  Laterality: Right;  . TUBAL LIGATION      Family History  Problem Relation Age of Onset  . Alzheimer's disease Mother   . Heart attack Mother   . Hypertension Mother   . Glaucoma Mother   . Cancer Father   . Heart attack Brother   . Heart disease Brother   . Glaucoma Brother   . Hyperlipidemia Brother   . Glaucoma Brother     Social history:  reports that  she has never smoked. She has never used smokeless tobacco. She reports that she drinks alcohol. She reports that she does not use drugs.    Allergies  Allergen Reactions  . Cymbalta [Duloxetine Hcl]     manic  . Demerol Hives and Other (See Comments)    Fever   . Ivp Dye [Iodinated Diagnostic Agents] Hives and Other (See Comments)    Fever   . Morphine And Related Other (See Comments)    ineffective  . Adhesive [Tape] Rash    Medications:  Prior to Admission medications   Medication Sig Start Date End Date Taking? Authorizing Provider  carbamazepine (TEGRETOL) 200 MG tablet 1/2 tablet twice a day for 3 weeks, then take 1/2 tablet in Marisa morning and 1 tablet in Marisa evening 02/09/16  Yes Kathrynn Ducking, MD  Cholecalciferol (VITAMIN D3) 5000 units TABS Take by mouth daily.   Yes Historical Provider, MD  Cholesterol POWD Take by mouth daily.   Yes Historical Provider, MD  cycloSPORINE (RESTASIS) 0.05 % ophthalmic emulsion Place 2 drops into both eyes 2 (two) times daily.    Yes Historical Provider, MD  diphenoxylate-atropine (LOMOTIL) 2.5-0.025 MG per tablet Take 1 tablet by mouth 4 (four) times daily as needed for diarrhea or loose stools.  Yes Historical Provider, MD  furosemide (LASIX) 20 MG tablet Take 1 tablet (20 mg total) by mouth daily. 04/20/15  Yes Sueanne Margarita, MD  gabapentin (NEURONTIN) 300 MG capsule two capsule in Marisa morning and 5 tablets at bedtime 11/04/15  Yes Kathrynn Ducking, MD  HYDROcodone-acetaminophen (NORCO/VICODIN) 5-325 MG tablet Take 1 tablet by mouth as needed. 12/25/15  Yes Historical Provider, MD  hydroxychloroquine (PLAQUENIL) 200 MG tablet Take 200 mg by mouth as directed. 1 tab in am and 1/2 tab in pm   Yes Historical Provider, MD  hydrOXYzine (ATARAX/VISTARIL) 50 MG tablet Take 1 tablet (50 mg total) by mouth at bedtime. For itching 03/22/16  Yes Deborah Opalski, DO  hyoscyamine (LEVBID) 0.375 MG 12 hr tablet Take 0.375 mg by mouth daily.    Yes Historical  Provider, MD  leflunomide (ARAVA) 20 MG tablet Take 20 mg by mouth daily.   Yes Historical Provider, MD  lidocaine (LIDODERM) 5 % Place 1 patch onto Marisa skin as needed. Remove & Discard patch within 12 hours or as directed by MD   Yes Historical Provider, MD  metoprolol succinate (TOPROL-XL) 25 MG 24 hr tablet Take 1 tablet (25 mg total) by mouth daily. 12/31/15  Yes Sueanne Margarita, MD  Multiple Vit-Min-Calcium-FA (FOLGARD OS) 500-1.1 MG TABS Take 1 tablet by mouth 2 (two) times daily.   Yes Historical Provider, MD  pantoprazole (PROTONIX) 40 MG tablet Take 40 mg by mouth daily.   Yes Historical Provider, MD  potassium chloride (K-DUR,KLOR-CON) 10 MEQ tablet Take daily 03/24/16  Yes Deborah Opalski, DO  Probiotic Product (ALIGN) 4 MG CAPS Take 1 capsule by mouth daily.   Yes Historical Provider, MD  Probiotic Product ([place]) 170 MG CAPS Take 1 capsule by mouth every morning.   Yes Historical Provider, MD  promethazine (PHENERGAN) 25 MG tablet Take 25 mg by mouth every 6 (six) hours as needed.     Yes Historical Provider, MD  silver sulfADIAZINE (SILVADENE) 1 % cream Apply 1 application topically as needed.   Yes Historical Provider, MD  sulfaSALAzine (AZULFIDINE) 500 MG EC tablet  01/19/16  Yes Historical Provider, MD  tiZANidine (ZANAFLEX) 4 MG tablet Take 1 tablet by mouth as needed. 01/07/16  Yes Historical Provider, MD  triamcinolone ointment (KENALOG) 0.1 % To affected areas of itching Three times daily 03/22/16  Yes Deborah Opalski, DO    ROS:  Out of a complete 14 system review of symptoms, [name] complains only of Marisa following symptoms, and all other reviewed systems are negative.  Decreased appetite, weight loss Difficulty swallowing, drooling Cough Leg swelling, palpitations of Marisa heart Cold intolerance, heat intolerance Diarrhea, nausea Sleep apnea on CPAP Incontinence of Marisa bladder Joint pain, joint swelling, back pain, achy muscles, walking  difficulty Itching Swollen lymph nodes, easy bruising Numbness, weakness  Blood pressure 115/74, pulse 76, height 5\' 1"  (1.549 m), weight 224 lb (101.6 kg).  Physical Exam  General: [name] is alert and cooperative at Marisa time of Marisa examination. [name] is morbidly obese.  Skin: 1+ edema at ankles is noted bilaterally.   Neurologic Exam  Mental status: [name] is alert and oriented x 3 at Marisa time of Marisa examination. [name] has apparent normal recent and remote memory, with an apparently normal attention span and concentration ability.   Cranial nerves: Facial symmetry is present. Speech is normal, no aphasia or dysarthria is noted. Extraocular movements are full. Visual fields are full.  Motor:  [name] has good strength in all 4 extremities.  Sensory examination: Soft touch sensation is symmetric on Marisa face, arms, and legs.  Coordination: [name] has good finger-nose-finger and heel-to-shin bilaterally.  Gait and station: [name] has a wide-based gait, [name] walks with a walker. Romberg is negative, tandem gait was not attempted.  Reflexes: Deep tendon reflexes are symmetric, but are depressed.   Assessment/Plan:  1. Peripheral neuropathy  2. Gait disorder  [name] will be increased on Marisa carbamazepine taking 200 mg twice daily. [name] will have blood work done today, she will follow-up in 6 months, sooner if needed.  Jill Alexanders MD 05/09/2016 2:34 PM  [facility] [address] [place], [place] [address]  Phone [phone] Fax [phone]

## 2016-05-10 LAB — CBC WITH DIFFERENTIAL/PLATELET
BASOS: 1 %
Basophils Absolute: 0 10*3/uL (ref 0.0–0.2)
EOS (ABSOLUTE): 0.1 10*3/uL (ref 0.0–0.4)
EOS: 1 %
HEMATOCRIT: 39.5 % (ref 34.0–46.6)
HEMOGLOBIN: 12.9 g/dL (ref 11.1–15.9)
IMMATURE GRANS (ABS): 0 10*3/uL (ref 0.0–0.1)
Immature Granulocytes: 0 %
LYMPHS: 27 %
Lymphocytes Absolute: 1.3 10*3/uL (ref 0.7–3.1)
MCH: 30.1 pg (ref 26.6–33.0)
MCHC: 32.7 g/dL (ref 31.5–35.7)
MCV: 92 fL (ref 79–97)
Monocytes Absolute: 0.6 10*3/uL (ref 0.1–0.9)
Monocytes: 12 %
NEUTROS ABS: 2.7 10*3/uL (ref 1.4–7.0)
Neutrophils: 59 %
PLATELETS: 180 10*3/uL (ref 150–379)
RBC: 4.29 x10E6/uL (ref 3.77–5.28)
RDW: 13.3 % (ref 12.3–15.4)
WBC: 4.7 10*3/uL (ref 3.4–10.8)

## 2016-05-10 LAB — CARBAMAZEPINE LEVEL, TOTAL: Carbamazepine (Tegretol), S: 4.4 ug/mL (ref 4.0–12.0)

## 2016-05-10 LAB — COMPREHENSIVE METABOLIC PANEL
ALBUMIN: 4 g/dL (ref 3.6–4.8)
ALK PHOS: 98 IU/L (ref 39–117)
ALT: 13 IU/L (ref 0–32)
AST: 17 IU/L (ref 0–40)
Albumin/Globulin Ratio: 1.6 (ref 1.2–2.2)
BILIRUBIN TOTAL: 0.3 mg/dL (ref 0.0–1.2)
BUN / CREAT RATIO: 15 (ref 12–28)
BUN: 9 mg/dL (ref 8–27)
CHLORIDE: 104 mmol/L (ref 96–106)
CO2: 24 mmol/L (ref 18–29)
Calcium: 9.3 mg/dL (ref 8.7–10.3)
Creatinine, Ser: 0.6 mg/dL (ref 0.57–1.00)
GFR calc Af Amer: 112 mL/min/{1.73_m2} (ref >59)
GFR calc non Af Amer: 97 mL/min/{1.73_m2} (ref >59)
GLOBULIN, TOTAL: 2.5 g/dL (ref 1.5–4.5)
Glucose: 89 mg/dL (ref 65–99)
Potassium: 4.1 mmol/L (ref 3.5–5.2)
SODIUM: 143 mmol/L (ref 134–144)
Total Protein: 6.5 g/dL (ref 6.0–8.5)

## 2016-05-11 ENCOUNTER — Ambulatory Visit: Payer: PPO

## 2016-05-18 ENCOUNTER — Ambulatory Visit
Admission: RE | Admit: 2016-05-18 | Discharge: 2016-05-18 | Disposition: A | Discharge status: Home / Self Care | Payer: PPO | Source: Ambulatory Visit | Attending: Family Medicine | Admitting: Family Medicine

## 2016-05-18 DIAGNOSIS — G4733 Obstructive sleep apnea (adult) (pediatric): Secondary | ICD-10-CM | POA: Diagnosis not present

## 2016-05-18 DIAGNOSIS — Z1231 Encounter for screening mammogram for malignant neoplasm of breast: Secondary | ICD-10-CM | POA: Diagnosis not present

## 2016-05-18 DIAGNOSIS — R269 Unspecified abnormalities of gait and mobility: Secondary | ICD-10-CM | POA: Diagnosis not present

## 2016-05-23 ENCOUNTER — Ambulatory Visit (INDEPENDENT_AMBULATORY_CARE_PROVIDER_SITE_OTHER): Payer: PPO | Admitting: Family Medicine

## 2016-05-23 ENCOUNTER — Encounter: Payer: Self-pay | Admitting: Family Medicine

## 2016-05-23 VITALS — BP 114/67 | HR 82 | Ht 61.0 in | Wt 229.7 lb

## 2016-05-23 DIAGNOSIS — Z1159 Encounter for screening for other viral diseases: Secondary | ICD-10-CM | POA: Diagnosis not present

## 2016-05-23 DIAGNOSIS — B354 Tinea corporis: Secondary | ICD-10-CM

## 2016-05-23 DIAGNOSIS — Z114 Encounter for screening for human immunodeficiency virus [HIV]: Secondary | ICD-10-CM

## 2016-05-23 DIAGNOSIS — R7302 Impaired glucose tolerance (oral): Secondary | ICD-10-CM

## 2016-05-23 DIAGNOSIS — Z23 Encounter for immunization: Secondary | ICD-10-CM

## 2016-05-23 DIAGNOSIS — Z0000 Encounter for general adult medical examination without abnormal findings: Secondary | ICD-10-CM | POA: Insufficient documentation

## 2016-05-23 LAB — POCT GLYCOSYLATED HEMOGLOBIN (HGB A1C): Hemoglobin A1C: 4.5

## 2016-05-23 NOTE — Progress Notes (Signed)
Impression and Recommendations:    1. Encounter for wellness examination   2. Glucose intolerance (impaired glucose tolerance)   3. Need for hepatitis C screening test   4. Need for prophylactic vaccination and inoculation against influenza   5. Morbid obesity ([place])   6. Tinea corporis- breast   7. Encounter for screening for HIV     Please see orders section below for further details of actions taken during this office visit.  Gross side effects, risk and benefits, and alternatives of medications discussed with patient.  Patient is aware that all medications have potential side effects and we are unable to predict every side effect or drug-drug interaction that may occur.  Expresses verbal understanding and consents to current therapy plan and treatment regiment.  1) Anticipatory Guidance: Discussed importance of wearing a seatbelt while driving, not texting while driving; sunscreen when outside along with yearly skin surveillance; eating a well balanced and modest diet; physical activity at least 25 minutes per day or 150 min/ week of moderate to intense activity.  2) Immunizations / Screenings / Labs:  All immunizations and screenings that patient agrees to, are up-to-date per recommendations or will be updated today.  Patient understands the needs for q 39mo dental and yearly vision screens which pt will schedule independently. Obtain CBC, CMP, HgA1c, Lipid panel, TSH and vit D when fasting if not already done recently.   3) Weight:   Discussed goal of losing even 5-10% of current body weight which would improve overall feelings of well being and improve objective health data significantly.   Improve nutrient density of diet through increasing intake of fruits and vegetables and decreasing saturated/trans fats, white flour products and refined sugar products.  -  Treat tinea corporis - OTC lamisil. Education given.  - recheck labs.  - educated on diet.  F-up preventative CPE in 1  year. F/up sooner for chronic care management as discussed and/or prn. Orders Placed This Encounter  Procedures  . Flu Vaccine QUAD 36+ mos IM  . Hepatitis C Antibody  . TSH  . Lipid panel  . HIV antibody (with reflex)  . VITAMIN D 25 Hydroxy (Vit-D Deficiency, Fractures)  . Vitamin B12    Standing Status:   Future    Number of Occurrences:   1    Standing Expiration Date:   05/23/2017  . POCT glycosylated hemoglobin (Hb A1C)    Please see orders placed and AVS handed out to patient at the end of our visit for further patient instructions/ counseling done pertaining to today's office visit.   .  Subjective:    Chief Complaint  Patient presents with  . Annual Exam    HPI: [name] is a 65 y.o. female who presents to [facility] today a yearly health maintenance exam.  c/o rash underneath breast. Redness with mild irritation. No vesiclse or associated sxs.    Health Maintenance Summary Reviewed and updated, unless pt declines services.  Alcohol:        No concerns, no excessive use Exercise Habits:     Trying, but not regular exercise STD concerns:     no Drug Use:     None Birth control method:    n/a Menses regular:      N/a- HYST 1981--> TAH w ooph Lumps or breast concerns:      No    Breast Cancer Family History:      no COlonoscopy- UTD- Dr  Collene Mares Has a dermatologist who does yearly skin screening  Wt Readings from Last 3 Encounters:  05/23/16 229 lb 11.2 oz (104.2 kg)  05/09/16 224 lb (101.6 kg)  05/08/16 224 lb 6.4 oz (101.8 kg)   BP Readings from Last 3 Encounters:  05/23/16 114/67  05/09/16 115/74  05/08/16 120/84   Pulse Readings from Last 3 Encounters:  05/23/16 82  05/09/16 76  05/08/16 80     Past Medical History:  Diagnosis Date  . Arthritis    Rheumatoid arthritis,   . Blood transfusion    1981  . Fibromyalgia   . Heart murmur    as a child  . Hiatal hernia    sjorgens syndrome  . Peripheral  autonomic neuropathy of unknown cause       Past Surgical History:  Procedure Laterality Date  . ABDOMINAL HYSTERECTOMY     BTL, BSO  . CHOLECYSTECTOMY    . DIAGNOSTIC LAPAROSCOPY     x3  . KNEE ARTHROSCOPY     x2  . patotid cystectomy    . TENDON REPAIR  1980   left ankle and tibia  . TONSILLECTOMY    . TOTAL KNEE ARTHROPLASTY  06/21/2011   Procedure: TOTAL KNEE ARTHROPLASTY;  Surgeon: Gearlean Alf;  Location: [facility];  Service: Orthopedics;  Laterality: Right;  . TUBAL LIGATION        Family History  Problem Relation Age of Onset  . Alzheimer's disease Mother   . Heart attack Mother   . Hypertension Mother   . Glaucoma Mother   . Cancer Father   . Heart attack Brother   . Heart disease Brother   . Glaucoma Brother   . Hyperlipidemia Brother   . Glaucoma Brother       History  Drug Use No  ,   History  Alcohol Use  . Yes    Comment: less than 1 month  ,   History  Smoking Status  . Never Smoker  Smokeless Tobacco  . Never Used  ,   History  Sexual Activity  . Sexual activity: No    Current Outpatient Prescriptions on File Prior to Visit  Medication Sig Dispense Refill  . carbamazepine (TEGRETOL) 200 MG tablet Take 1 tablet (200 mg total) by mouth 2 (two) times daily. 60 tablet 5  . Cholecalciferol (VITAMIN D3) 5000 units TABS Take by mouth daily.    . Cholesterol POWD Take by mouth daily.    . cycloSPORINE (RESTASIS) 0.05 % ophthalmic emulsion Place 2 drops into both eyes 2 (two) times daily.     . diphenoxylate-atropine (LOMOTIL) 2.5-0.025 MG per tablet Take 1 tablet by mouth 4 (four) times daily as needed for diarrhea or loose stools.    . furosemide (LASIX) 20 MG tablet Take 1 tablet (20 mg total) by mouth daily. 90 tablet 3  . HYDROcodone-acetaminophen (NORCO/VICODIN) 5-325 MG tablet Take 1 tablet by mouth as needed.  0  . hydroxychloroquine (PLAQUENIL) 200 MG tablet Take 200 mg by mouth as directed. 1 tab in am and 1/2 tab in pm    .  hydrOXYzine (ATARAX/VISTARIL) 50 MG tablet Take 1 tablet (50 mg total) by mouth at bedtime. For itching (Patient taking differently: Take 50 mg by mouth as needed. For itching) 30 tablet 0  . hyoscyamine (LEVBID) 0.375 MG 12 hr tablet Take 0.375 mg by mouth daily.     [name] leflunomide (ARAVA) 20 MG tablet Take 20 mg by mouth daily.    [name]  lidocaine (LIDODERM) 5 % Place 1 patch onto the skin as needed. Remove & Discard patch within 12 hours or as directed by MD    . metoprolol succinate (TOPROL-XL) 25 MG 24 hr tablet Take 1 tablet (25 mg total) by mouth daily. 90 tablet 2  . Multiple Vit-Min-Calcium-FA (FOLGARD OS) 500-1.1 MG TABS Take 1 tablet by mouth 2 (two) times daily.    . pantoprazole (PROTONIX) 40 MG tablet Take 40 mg by mouth daily.    . potassium chloride (K-DUR,KLOR-CON) 10 MEQ tablet Take daily 30 tablet 2  . Probiotic Product (ALIGN) 4 MG CAPS Take 1 capsule by mouth daily.    . Probiotic Product (Nazareth) 170 MG CAPS Take 1 capsule by mouth every morning.    . promethazine (PHENERGAN) 25 MG tablet Take 25 mg by mouth every 6 (six) hours as needed.      . silver sulfADIAZINE (SILVADENE) 1 % cream Apply 1 application topically as needed.    . sulfaSALAzine (AZULFIDINE) 500 MG EC tablet Take 1,000 mg by mouth 2 (two) times daily.   2  . tiZANidine (ZANAFLEX) 4 MG tablet Take 1 tablet by mouth as needed.  1  . triamcinolone ointment (KENALOG) 0.1 % To affected areas of itching Three times daily 80 g 1   Current Facility-Administered Medications on File Prior to Visit  Medication Dose Route Frequency Provider Last Rate Last Dose  . chlorhexidine (HIBICLENS) 4 % liquid 4 application  60 mL Topical Once Gaynelle Arabian, MD      . chlorhexidine (HIBICLENS) 4 % liquid 4 application  60 mL Topical Once Gaynelle Arabian, MD        Allergies: Cymbalta [duloxetine hcl]; Demerol; Ivp dye [iodinated diagnostic agents]; Morphine and related; and Adhesive [tape]  Review of Systems    Constitutional: Negative.  Negative for chills, diaphoresis, fever, malaise/fatigue and weight loss.  HENT: Negative.  Negative for congestion, sore throat and tinnitus.   Eyes: Negative.  Negative for blurred vision, double vision and photophobia.  Respiratory: Positive for cough and sputum production. Negative for wheezing.   Cardiovascular: Positive for palpitations. Negative for chest pain.  Gastrointestinal: Negative for blood in stool, nausea and vomiting.  Genitourinary: Negative.  Negative for dysuria, frequency and urgency.  Musculoskeletal: Positive for back pain, joint pain and myalgias.  Skin: Positive for itching. Negative for rash.       Chronic issue  Neurological: Negative.  Negative for dizziness, focal weakness, weakness and headaches.  Endo/Heme/Allergies: Negative for environmental allergies and polydipsia. Bruises/bleeds easily.  Psychiatric/Behavioral: Negative.  Negative for depression and memory loss. The patient is not nervous/anxious and does not have insomnia.      Objective:    Blood pressure 114/67, pulse 82, height 5\' 1"  (1.549 m), weight 229 lb 11.2 oz (104.2 kg). Body mass index is 43.4 kg/m. General Appearance:    Alert, cooperative, no distress, appears stated age  Head:    Normocephalic, without obvious abnormality, atraumatic  Eyes:    PERRL, conjunctiva/corneas clear, EOM's intact, fundi    benign, both eyes  Ears:    Normal TM's and external ear canals, both ears  Nose:   Nares normal, septum midline, mucosa normal, no drainage    or sinus tenderness  Throat:   Lips w/o lesion, mucosa moist, and tongue normal; teeth and   gums normal  Neck:   Supple, symmetrical, trachea midline, no adenopathy;    thyroid:  no enlargement/tenderness/nodules; no carotid   bruit or JVD  Back:     Symmetric, no curvature, ROM normal, no CVA tenderness  Lungs:     Clear to auscultation bilaterally, respirations unlabored, no       Wh/ R/ R  Chest Wall:    No  tenderness or gross deformity; normal excursion   Heart:    Regular rate and rhythm, S1 and S2 normal, no murmur, rub   or gallop  Breast Exam:    Tenia corporis in the breast folds b/l inferiorly   Abdomen:     Soft, non-tender, bowel sounds active all four quadrants, NO   G/R/R, no masses, no organomegaly  Genitalia:  deferred  Rectal:    Extremities:   Extremities normal, atraumatic, no cyanosis or gross edema  Pulses:   2+ and symmetric all extremities  Skin:   Warm, dry, Skin color, texture, turgor normal, rashes under breast- candida Psych: No HI/SI, judgement and insight good, Euthymic mood. Full Affect.  Neurologic:   CNII-XII intact, normal strength, sensation and reflexes    Throughout

## 2016-05-23 NOTE — Patient Instructions (Signed)
Preventive Care for Adults, Female A healthy lifestyle and preventive care can promote health and wellness. Preventive health guidelines for women include the following key practices.   A routine yearly physical is a good way to check with your health care provider about your health and preventive screening. It is a chance to share any concerns and updates on your health and to receive a thorough exam.   Visit your dentist for a routine exam and preventive care every 6 months. Brush your teeth twice a day and floss once a day. Good oral hygiene prevents tooth decay and gum disease.   The frequency of eye exams is based on your age, health, family medical history, use of contact lenses, and other factors. Follow your health care provider's recommendations for frequency of eye exams.   Eat a healthy diet. Foods like vegetables, fruits, whole grains, low-fat dairy products, and lean protein foods contain the nutrients you need without too many calories. Decrease your intake of foods high in solid fats, added sugars, and salt. Eat the right amount of calories for you.Get information about a proper diet from your health care provider, if necessary.   Regular physical exercise is one of the most important things you can do for your health. Most adults should get at least 150 minutes of moderate-intensity exercise (any activity that increases your heart rate and causes you to sweat) each week. In addition, most adults need muscle-strengthening exercises on 2 or more days a week.   Maintain a healthy weight. The body mass index (BMI) is a screening tool to identify possible weight problems. It provides an estimate of body fat based on height and weight. Your health care provider can find your BMI, and can help you achieve or maintain a healthy weight.For adults 20 years and older:   - A BMI below 18.5 is considered underweight.   - A BMI of 18.5 to 24.9 is normal.   - A BMI of 25 to 29.9 is  considered overweight.   - A BMI of 30 and above is considered obese.   Maintain normal blood lipids and cholesterol levels by exercising and minimizing your intake of trans and saturated fats.  Eat a balanced diet with plenty of fruit and vegetables. Blood tests for lipids and cholesterol should begin at age 85 and be repeated every 5 years minimum.  If your lipid or cholesterol levels are high, you are over 40, or you are at high risk for heart disease, you may need your cholesterol levels checked more frequently.Ongoing high lipid and cholesterol levels should be treated with medicines if diet and exercise are not working.   If you smoke, find out from your health care provider how to quit. If you do not use tobacco, do not start.   Lung cancer screening is recommended for adults aged 69-80 years who are at high risk for developing lung cancer because of a history of smoking. A yearly low-dose CT scan of the lungs is recommended for people who have at least a 30-pack-year history of smoking and are a current smoker or have quit within the past 15 years. A pack year of smoking is smoking an average of 1 pack of cigarettes a day for 1 year (for example: 1 pack a day for 30 years or 2 packs a day for 15 years). Yearly screening should continue until the smoker has stopped smoking for at least 15 years. Yearly screening should be stopped for people who develop a health  problem that would prevent them from having lung cancer treatment.   If you are pregnant, do not drink alcohol. If you are breastfeeding, be very cautious about drinking alcohol. If you are not pregnant and choose to drink alcohol, do not have more than 1 drink per day. One drink is considered to be 12 ounces (355 mL) of beer, 5 ounces (148 mL) of wine, or 1.5 ounces (44 mL) of liquor.   Avoid use of street drugs. Do not share needles with anyone. Ask for help if you need support or instructions about stopping the use of  drugs.   High blood pressure causes heart disease and increases the risk of stroke. Your blood pressure should be checked at least yearly.  Ongoing high blood pressure should be treated with medicines if weight loss and exercise do not work.   If you are 62-70 years old, ask your health care provider if you should take aspirin to prevent strokes.   Diabetes screening involves taking a blood sample to check your fasting blood sugar level. This should be done once every 3 years, after age 58, if you are within normal weight and without risk factors for diabetes. Testing should be considered at a younger age or be carried out more frequently if you are overweight and have at least 1 risk factor for diabetes.   Breast cancer screening is essential preventive care for women. You should practice "breast self-awareness."  This means understanding the normal appearance and feel of your breasts and may include breast self-examination.  Any changes detected, no matter how small, should be reported to a health care provider.  Women in their 64s and 30s should have a clinical breast exam (CBE) by a health care provider as part of a regular health exam every 1 to 3 years.  After age 54, women should have a CBE every year.  Starting at age 59, women should consider having a mammogram (breast X-ray test) every year.  Women who have a family history of breast cancer should talk to their health care provider about genetic screening.  Women at a high risk of breast cancer should talk to their health care providers about having an MRI and a mammogram every year.   -Breast cancer gene (BRCA)-related cancer risk assessment is recommended for women who have family members with BRCA-related cancers. BRCA-related cancers include breast, ovarian, tubal, and peritoneal cancers. Having family members with these cancers may be associated with an increased risk for harmful changes (mutations) in the breast cancer genes BRCA1 and  BRCA2. Results of the assessment will determine the need for genetic counseling and BRCA1 and BRCA2 testing.   The Pap test is a screening test for cervical cancer. A Pap test can show cell changes on the cervix that might become cervical cancer if left untreated. A Pap test is a procedure in which cells are obtained and examined from the lower end of the uterus (cervix).   - Women should have a Pap test starting at age 5.   - Between ages 61 and 15, Pap tests should be repeated every 2 years.   - Beginning at age 19, you should have a Pap test every 3 years as long as the past 3 Pap tests have been normal.   - Some women have medical problems that increase the chance of getting cervical cancer. Talk to your health care provider about these problems. It is especially important to talk to your health care provider if a new  problem develops soon after your last Pap test. In these cases, your health care provider may recommend more frequent screening and Pap tests.   - The above recommendations are the same for women who have or have not gotten the vaccine for human papillomavirus (HPV).   - If you had a hysterectomy for a problem that was not cancer or a condition that could lead to cancer, then you no longer need Pap tests. Even if you no longer need a Pap test, a regular exam is a good idea to make sure no other problems are starting.   - If you are between ages 60 and 39 years, and you have had normal Pap tests going back 10 years, you no longer need Pap tests. Even if you no longer need a Pap test, a regular exam is a good idea to make sure no other problems are starting.   - If you have had past treatment for cervical cancer or a condition that could lead to cancer, you need Pap tests and screening for cancer for at least 20 years after your treatment.   - If Pap tests have been discontinued, risk factors (such as a new sexual partner) need to be reassessed to determine if screening should  be resumed.   - The HPV test is an additional test that may be used for cervical cancer screening. The HPV test looks for the virus that can cause the cell changes on the cervix. The cells collected during the Pap test can be tested for HPV. The HPV test could be used to screen women aged 73 years and older, and should be used in women of any age who have unclear Pap test results. After the age of 70, women should have HPV testing at the same frequency as a Pap test.   Colorectal cancer can be detected and often prevented. Most routine colorectal cancer screening begins at the age of 63 years and continues through age 92 years. However, your health care provider may recommend screening at an earlier age if you have risk factors for colon cancer. On a yearly basis, your health care provider may provide home test kits to check for hidden blood in the stool.  Use of a small camera at the end of a tube, to directly examine the colon (sigmoidoscopy or colonoscopy), can detect the earliest forms of colorectal cancer. Talk to your health care provider about this at age 49, when routine screening begins. Direct exam of the colon should be repeated every 5 -10 years through age 54 years, unless early forms of pre-cancerous polyps or small growths are found.   People who are at an increased risk for hepatitis B should be screened for this virus. You are considered at high risk for hepatitis B if:  -You were born in a country where hepatitis B occurs often. Talk with your health care provider about which countries are considered high risk.  - Your parents were born in a high-risk country and you have not received a shot to protect against hepatitis B (hepatitis B vaccine).  - You have HIV or AIDS.  - You use needles to inject street drugs.  - You live with, or have sex with, someone who has Hepatitis B.  - You get hemodialysis treatment.  - You take certain medicines for conditions like cancer, organ  transplantation, and autoimmune conditions.   Hepatitis C blood testing is recommended for all people born from [birth year] through 1965 and any individual with  known risks for hepatitis C.   Practice safe sex. Use condoms and avoid high-risk sexual practices to reduce the spread of sexually transmitted infections (STIs). STIs include gonorrhea, chlamydia, syphilis, trichomonas, herpes, HPV, and human immunodeficiency virus (HIV). Herpes, HIV, and HPV are viral illnesses that have no cure. They can result in disability, cancer, and death. Sexually active women aged 63 years and younger should be checked for chlamydia. Older women with new or multiple partners should also be tested for chlamydia. Testing for other STIs is recommended if you are sexually active and at increased risk.   Osteoporosis is a disease in which the bones lose minerals and strength with aging. This can result in serious bone fractures or breaks. The risk of osteoporosis can be identified using a bone density scan. Women ages 60 years and over and women at risk for fractures or osteoporosis should discuss screening with their health care providers. Ask your health care provider whether you should take a calcium supplement or vitamin D to reduce the rate of osteoporosis.   Menopause can be associated with physical symptoms and risks. Hormone replacement therapy is available to decrease symptoms and risks. You should talk to your health care provider about whether hormone replacement therapy is right for you.   Use sunscreen. Apply sunscreen liberally and repeatedly throughout the day. You should seek shade when your shadow is shorter than you. Protect yourself by wearing long sleeves, pants, a wide-brimmed hat, and sunglasses year round, whenever you are outdoors.   Once a month, do a whole body skin exam, using a mirror to look at the skin on your back. Tell your health care provider of new moles, moles that have irregular borders,  moles that are larger than a pencil eraser, or moles that have changed in shape or color.   Stay current with required vaccines (immunizations).   Influenza vaccine. All adults should be immunized every year.   Tetanus, diphtheria, and acellular pertussis (Td, Tdap) vaccine. Pregnant women should receive 1 dose of Tdap vaccine during each pregnancy. The dose should be obtained regardless of the length of time since the last dose. Immunization is preferred during the 27th 36th week of gestation. An adult who has not previously received Tdap or who does not know her vaccine status should receive 1 dose of Tdap. This initial dose should be followed by tetanus and diphtheria toxoids (Td) booster doses every 10 years. Adults with an unknown or incomplete history of completing a 3-dose immunization series with Td-containing vaccines should begin or complete a primary immunization series including a Tdap dose. Adults should receive a Td booster every 10 years.   Varicella vaccine. An adult without evidence of immunity to varicella should receive 2 doses or a second dose if she has previously received 1 dose. Pregnant females who do not have evidence of immunity should receive the first dose after pregnancy. This first dose should be obtained before leaving the health care facility. The second dose should be obtained 4 8 weeks after the first dose.   Human papillomavirus (HPV) vaccine. Females aged 83 26 years who have not received the vaccine previously should obtain the 3-dose series. The vaccine is not recommended for use in pregnant females. However, pregnancy testing is not needed before receiving a dose. If a female is found to be pregnant after receiving a dose, no treatment is needed. In that case, the remaining doses should be delayed until after the pregnancy. Immunization is recommended for any person with  an immunocompromised condition through the age of 28 years if she did not get any or all  doses earlier. During the 3-dose series, the second dose should be obtained 4 8 weeks after the first dose. The third dose should be obtained 24 weeks after the first dose and 16 weeks after the second dose.   Zoster vaccine. One dose is recommended for adults aged 35 years or older unless certain conditions are present.   Measles, mumps, and rubella (MMR) vaccine. Adults born before 65 generally are considered immune to measles and mumps. Adults born in [birth year] or later should have 1 or more doses of MMR vaccine unless there is a contraindication to the vaccine or there is laboratory evidence of immunity to each of the three diseases. A routine second dose of MMR vaccine should be obtained at least 28 days after the first dose for students attending postsecondary schools, health care workers, or international travelers. People who received inactivated measles vaccine or an unknown type of measles vaccine during 1963 1967 should receive 2 doses of MMR vaccine. People who received inactivated mumps vaccine or an unknown type of mumps vaccine before 1979 and are at high risk for mumps infection should consider immunization with 2 doses of MMR vaccine. For females of childbearing age, rubella immunity should be determined. If there is no evidence of immunity, females who are not pregnant should be vaccinated. If there is no evidence of immunity, females who are pregnant should delay immunization until after pregnancy. Unvaccinated health care workers born before [birth year] who lack laboratory evidence of measles, mumps, or rubella immunity or laboratory confirmation of disease should consider measles and mumps immunization with 2 doses of MMR vaccine or rubella immunization with 1 dose of MMR vaccine.   Pneumococcal 13-valent conjugate (PCV13) vaccine. When indicated, a person who is uncertain of her immunization history and has no record of immunization should receive the PCV13 vaccine. An adult aged 77 years or  older who has certain medical conditions and has not been previously immunized should receive 1 dose of PCV13 vaccine. This PCV13 should be followed with a dose of pneumococcal polysaccharide (PPSV23) vaccine. The PPSV23 vaccine dose should be obtained at least 8 weeks after the dose of PCV13 vaccine. An adult aged 108 years or older who has certain medical conditions and previously received 1 or more doses of PPSV23 vaccine should receive 1 dose of PCV13. The PCV13 vaccine dose should be obtained 1 or more years after the last PPSV23 vaccine dose.   Pneumococcal polysaccharide (PPSV23) vaccine. When PCV13 is also indicated, PCV13 should be obtained first. All adults aged 52 years and older should be immunized. An adult younger than age 19 years who has certain medical conditions should be immunized. Any person who resides in a nursing home or long-term care facility should be immunized. An adult smoker should be immunized. People with an immunocompromised condition and certain other conditions should receive both PCV13 and PPSV23 vaccines. People with human immunodeficiency virus (HIV) infection should be immunized as soon as possible after diagnosis. Immunization during chemotherapy or radiation therapy should be avoided. Routine use of PPSV23 vaccine is not recommended for American Indians, [place]atives, or people younger than 65 years unless there are medical conditions that require PPSV23 vaccine. When indicated, people who have unknown immunization and have no record of immunization should receive PPSV23 vaccine. One-time revaccination 5 years after the first dose of PPSV23 is recommended for people aged 58 64 years who have chronic  kidney failure, nephrotic syndrome, asplenia, or immunocompromised conditions. People who received 1 2 doses of PPSV23 before age 34 years should receive another dose of PPSV23 vaccine at age 28 years or later if at least 5 years have passed since the previous dose. Doses of  PPSV23 are not needed for people immunized with PPSV23 at or after age 48 years.   Meningococcal vaccine. Adults with asplenia or persistent complement component deficiencies should receive 2 doses of quadrivalent meningococcal conjugate (MenACWY-D) vaccine. The doses should be obtained at least 2 months apart. Microbiologists working with certain meningococcal bacteria, [employer] recruits, people at risk during an outbreak, and people who travel to or live in countries with a high rate of meningitis should be immunized. A first-year college student up through age 75 years who is living in a residence hall should receive a dose if she did not receive a dose on or after her 16th birthday. Adults who have certain high-risk conditions should receive one or more doses of vaccine.   Hepatitis A vaccine. Adults who wish to be protected from this disease, have certain high-risk conditions, work with hepatitis A-infected animals, work in hepatitis A research labs, or travel to or work in countries with a high rate of hepatitis A should be immunized. Adults who were previously unvaccinated and who anticipate close contact with an international adoptee during the first 60 days after arrival in the [place] from a country with a high rate of hepatitis A should be immunized.   Hepatitis B vaccine. Adults who wish to be protected from this disease, have certain high-risk conditions, may be exposed to blood or other infectious body fluids, are household contacts or sex partners of hepatitis B positive people, are clients or workers in certain care facilities, or travel to or work in countries with a high rate of hepatitis B should be immunized.   Haemophilus influenzae type b (Hib) vaccine. A previously unvaccinated person with asplenia or sickle cell disease or having a scheduled splenectomy should receive 1 dose of Hib vaccine. Regardless of previous immunization, a recipient of a hematopoietic stem cell  transplant should receive a 3-dose series 6 12 months after her successful transplant. Hib vaccine is not recommended for adults with HIV infection.  Preventive Services / Frequency Ages 35 to 39years  Blood pressure check.** / Every 1 to 2 years.  Lipid and cholesterol check.** / Every 5 years beginning at age 28.  Clinical breast exam.** / Every 3 years for women in their 31s and 7s.  BRCA-related cancer risk assessment.** / For women who have family members with a BRCA-related cancer (breast, ovarian, tubal, or peritoneal cancers).  Pap test.** / Every 2 years from ages 42 through 56. Every 3 years starting at age 68 through age 20 or 27 with a history of 3 consecutive normal Pap tests.  HPV screening.** / Every 3 years from ages 81 through ages 42 to 39 with a history of 3 consecutive normal Pap tests.  Hepatitis C blood test.** / For any individual with known risks for hepatitis C.  Skin self-exam. / Monthly.  Influenza vaccine. / Every year.  Tetanus, diphtheria, and acellular pertussis (Tdap, Td) vaccine.** / Consult your health care provider. Pregnant women should receive 1 dose of Tdap vaccine during each pregnancy. 1 dose of Td every 10 years.  Varicella vaccine.** / Consult your health care provider. Pregnant females who do not have evidence of immunity should receive the first dose after pregnancy.  HPV vaccine. /  3 doses over 6 months, if 26 and younger. The vaccine is not recommended for use in pregnant females. However, pregnancy testing is not needed before receiving a dose.  Measles, mumps, rubella (MMR) vaccine.** / You need at least 1 dose of MMR if you were born in 1957 or later. You may also need a 2nd dose. For females of childbearing age, rubella immunity should be determined. If there is no evidence of immunity, females who are not pregnant should be vaccinated. If there is no evidence of immunity, females who are pregnant should delay immunization until after  pregnancy.  Pneumococcal 13-valent conjugate (PCV13) vaccine.** / Consult your health care provider.  Pneumococcal polysaccharide (PPSV23) vaccine.** / 1 to 2 doses if you smoke cigarettes or if you have certain conditions.  Meningococcal vaccine.** / 1 dose if you are age 26 to 27 years and a Market researcher living in a residence hall, or have one of several medical conditions, you need to get vaccinated against meningococcal disease. You may also need additional booster doses.  Hepatitis A vaccine.** / Consult your health care provider.  Hepatitis B vaccine.** / Consult your health care provider.  Haemophilus influenzae type b (Hib) vaccine.** / Consult your health care provider.  Ages 52 to 64years  Blood pressure check.** / Every 1 to 2 years.  Lipid and cholesterol check.** / Every 5 years beginning at age 12 years.  Lung cancer screening. / Every year if you are aged 75 80 years and have a 30-pack-year history of smoking and currently smoke or have quit within the past 15 years. Yearly screening is stopped once you have quit smoking for at least 15 years or develop a health problem that would prevent you from having lung cancer treatment.  Clinical breast exam.** / Every year after age 42 years.  BRCA-related cancer risk assessment.** / For women who have family members with a BRCA-related cancer (breast, ovarian, tubal, or peritoneal cancers).  Mammogram.** / Every year beginning at age 27 years and continuing for as long as you are in good health. Consult with your health care provider.  Pap test.** / Every 3 years starting at age 32 years through age 80 or 10 years with a history of 3 consecutive normal Pap tests.  HPV screening.** / Every 3 years from ages 72 years through ages 66 to 85 years with a history of 3 consecutive normal Pap tests.  Fecal occult blood test (FOBT) of stool. / Every year beginning at age 3 years and continuing until age 100 years. You may  not need to do this test if you get a colonoscopy every 10 years.  Flexible sigmoidoscopy or colonoscopy.** / Every 5 years for a flexible sigmoidoscopy or every 10 years for a colonoscopy beginning at age 67 years and continuing until age 1 years.  Hepatitis C blood test.** / For all people born from [birth year] through 1964 and any individual with known risks for hepatitis C.  Skin self-exam. / Monthly.  Influenza vaccine. / Every year.  Tetanus, diphtheria, and acellular pertussis (Tdap/Td) vaccine.** / Consult your health care provider. Pregnant women should receive 1 dose of Tdap vaccine during each pregnancy. 1 dose of Td every 10 years.  Varicella vaccine.** / Consult your health care provider. Pregnant females who do not have evidence of immunity should receive the first dose after pregnancy.  Zoster vaccine.** / 1 dose for adults aged 69 years or older.  Measles, mumps, rubella (MMR) vaccine.** / You need at  least 1 dose of MMR if you were born in 1957 or later. You may also need a 2nd dose. For females of childbearing age, rubella immunity should be determined. If there is no evidence of immunity, females who are not pregnant should be vaccinated. If there is no evidence of immunity, females who are pregnant should delay immunization until after pregnancy.  Pneumococcal 13-valent conjugate (PCV13) vaccine.** / Consult your health care provider.  Pneumococcal polysaccharide (PPSV23) vaccine.** / 1 to 2 doses if you smoke cigarettes or if you have certain conditions.  Meningococcal vaccine.** / Consult your health care provider.  Hepatitis A vaccine.** / Consult your health care provider.  Hepatitis B vaccine.** / Consult your health care provider.  Haemophilus influenzae type b (Hib) vaccine.** / Consult your health care provider.  Ages 79 years and over  Blood pressure check.** / Every 1 to 2 years.  Lipid and cholesterol check.** / Every 5 years beginning at age 38  years.  Lung cancer screening. / Every year if you are aged 38 80 years and have a 30-pack-year history of smoking and currently smoke or have quit within the past 15 years. Yearly screening is stopped once you have quit smoking for at least 15 years or develop a health problem that would prevent you from having lung cancer treatment.  Clinical breast exam.** / Every year after age 39 years.  BRCA-related cancer risk assessment.** / For women who have family members with a BRCA-related cancer (breast, ovarian, tubal, or peritoneal cancers).  Mammogram.** / Every year beginning at age 26 years and continuing for as long as you are in good health. Consult with your health care provider.  Pap test.** / Every 3 years starting at age 3 years through age 70 or 79 years with 3 consecutive normal Pap tests. Testing can be stopped between 65 and 70 years with 3 consecutive normal Pap tests and no abnormal Pap or HPV tests in the past 10 years.  HPV screening.** / Every 3 years from ages 44 years through ages 71 or 8 years with a history of 3 consecutive normal Pap tests. Testing can be stopped between 65 and 70 years with 3 consecutive normal Pap tests and no abnormal Pap or HPV tests in the past 10 years.  Fecal occult blood test (FOBT) of stool. / Every year beginning at age 105 years and continuing until age 4 years. You may not need to do this test if you get a colonoscopy every 10 years.  Flexible sigmoidoscopy or colonoscopy.** / Every 5 years for a flexible sigmoidoscopy or every 10 years for a colonoscopy beginning at age 53 years and continuing until age 59 years.  Hepatitis C blood test.** / For all people born from [birth year] through 1964 and any individual with known risks for hepatitis C.  Osteoporosis screening.** / A one-time screening for women ages 67 years and over and women at risk for fractures or osteoporosis.  Skin self-exam. / Monthly.  Influenza vaccine. / Every year.  Tetanus,  diphtheria, and acellular pertussis (Tdap/Td) vaccine.** / 1 dose of Td every 10 years.  Varicella vaccine.** / Consult your health care provider.  Zoster vaccine.** / 1 dose for adults aged 3 years or older.  Pneumococcal 13-valent conjugate (PCV13) vaccine.** / Consult your health care provider.  Pneumococcal polysaccharide (PPSV23) vaccine.** / 1 dose for all adults aged 46 years and older.  Meningococcal vaccine.** / Consult your health care provider.  Hepatitis A vaccine.** / Consult your health  care provider.  Hepatitis B vaccine.** / Consult your health care provider.  Haemophilus influenzae type b (Hib) vaccine.** / Consult your health care provider.     ** Family history and personal history of risk and conditions may change your health care provider's recommendations. Document Released: 09/26/2001 Document Revised: 05/21/2013  Exit[employer] Patient Information 2014 [employer]   EXERCISE AND DIET:  We recommended that you start or continue a regular exercise program for good health. Regular exercise means any activity that makes your heart beat faster and makes you sweat.  We recommend exercising at least 30 minutes per day at least 3 days a week, preferably 5.  We also recommend a diet low in fat and sugar / carbohydrates.  Inactivity, poor dietary choices and obesity can cause diabetes, heart attack, stroke, and kidney damage, among others.     ALCOHOL AND SMOKING:  Women should limit their alcohol intake to no more than 7 drinks/beers/glasses of wine (combined, not each!) per week. Moderation of alcohol intake to this level decreases your risk of breast cancer and liver damage.  ( And of course, no recreational drugs are part of a healthy lifestyle.)  Also, you should not be smoking at all or even being exposed to second hand smoke. Most people know smoking can cause cancer, and various heart and lung diseases, but did you know it also contributes to weakening of your  bones?  Aging of your skin?  Yellowing of your teeth and nails?   CALCIUM AND VITAMIN D:  Adequate intake of calcium and Vitamin D are recommended.  The recommendations for exact amounts of these supplements seem to change often, but generally speaking 600 mg of calcium (either carbonate or citrate) and 800 units of Vitamin D per day seems prudent. Certain women may benefit from higher intake of Vitamin D.  If you are among these women, your doctor will have told you during your visit.     PAP SMEARS:  Pap smears, to check for cervical cancer or precancers,  have traditionally been done yearly, although recent scientific advances have shown that most women can have pap smears less often.  However, every woman still should have a physical exam from her gynecologist or primary care physician every year. It will include a breast check, inspection of the vulva and vagina to check for abnormal growths or skin changes, a visual exam of the cervix, and then an exam to evaluate the size and shape of the uterus and ovaries.  And after 64 years of age, a rectal exam is indicated to check for rectal cancers. We will also provide age appropriate advice regarding health maintenance, like when you should have certain vaccines, screening for sexually transmitted diseases, bone density testing, colonoscopy, mammograms, etc.    MAMMOGRAMS:  All women over 40 years old should have a yearly mammogram. Many facilities now offer a "3D" mammogram, which may cost around $50 extra out of pocket. If possible,  we recommend you accept the option to have the 3D mammogram performed.  It both reduces the number of women who will be called back for extra views which then turn out to be normal, and it is better than the routine mammogram at detecting truly abnormal areas.     COLONOSCOPY:  Colonoscopy to screen for colon cancer is recommended for all women at age 50.  We know, you hate the idea of the prep.  We agree, BUT, having  colon cancer and not knowing it is worse!!    Colon cancer so often starts as a polyp that can be seen and removed at colonscopy, which can quite literally save your life!  And if your first colonoscopy is normal and you have no family history of colon cancer, most women don't have to have it again for 10 years.  Once every ten years, you can do something that may end up saving your life, right?  We will be happy to help you get it scheduled when you are ready.  Be sure to check your insurance coverage so you understand how much it will cost.  It may be covered as a preventative service at no cost, but you should check your particular policy.   

## 2016-05-24 DIAGNOSIS — Z96651 Presence of right artificial knee joint: Secondary | ICD-10-CM | POA: Diagnosis not present

## 2016-05-24 DIAGNOSIS — Z471 Aftercare following joint replacement surgery: Secondary | ICD-10-CM | POA: Diagnosis not present

## 2016-05-24 DIAGNOSIS — M1712 Unilateral primary osteoarthritis, left knee: Secondary | ICD-10-CM | POA: Diagnosis not present

## 2016-05-24 LAB — LIPID PANEL
Cholesterol: 195 mg/dL (ref 125–200)
HDL: 41 mg/dL — ABNORMAL LOW (ref >=46)
LDL CALC: 89 mg/dL (ref <130)
TRIGLYCERIDES: 323 mg/dL — AB (ref <150)
Total CHOL/HDL Ratio: 4.8 Ratio (ref <=5.0)
VLDL: 65 mg/dL — AB (ref <30)

## 2016-05-24 LAB — VITAMIN D 25 HYDROXY (VIT D DEFICIENCY, FRACTURES): VIT D 25 HYDROXY: 53 ng/mL (ref 30–100)

## 2016-05-24 LAB — VITAMIN B12: VITAMIN B 12: 405 pg/mL (ref 200–1100)

## 2016-05-24 LAB — HIV ANTIBODY (ROUTINE TESTING W REFLEX): HIV 1&2 Ab, 4th Generation: NONREACTIVE

## 2016-05-24 LAB — TSH: TSH: 0.9 m[IU]/L

## 2016-05-24 LAB — HEPATITIS C ANTIBODY: HCV AB: NEGATIVE

## 2016-05-25 ENCOUNTER — Other Ambulatory Visit: Payer: Self-pay | Admitting: Family Medicine

## 2016-05-25 DIAGNOSIS — R928 Other abnormal and inconclusive findings on diagnostic imaging of breast: Secondary | ICD-10-CM

## 2016-05-29 ENCOUNTER — Other Ambulatory Visit: Payer: Self-pay | Admitting: Cardiology

## 2016-05-30 ENCOUNTER — Ambulatory Visit
Admission: RE | Admit: 2016-05-30 | Discharge: 2016-05-30 | Disposition: A | Discharge status: Home / Self Care | Payer: PPO | Source: Ambulatory Visit | Attending: Family Medicine | Admitting: Family Medicine

## 2016-05-30 ENCOUNTER — Other Ambulatory Visit: Payer: Self-pay | Admitting: Family Medicine

## 2016-05-30 DIAGNOSIS — N6323 Unspecified lump in the left breast, lower outer quadrant: Secondary | ICD-10-CM | POA: Diagnosis not present

## 2016-05-30 DIAGNOSIS — R928 Other abnormal and inconclusive findings on diagnostic imaging of breast: Secondary | ICD-10-CM

## 2016-06-06 ENCOUNTER — Other Ambulatory Visit: Payer: Self-pay | Admitting: Family Medicine

## 2016-06-06 DIAGNOSIS — R928 Other abnormal and inconclusive findings on diagnostic imaging of breast: Secondary | ICD-10-CM

## 2016-06-07 ENCOUNTER — Ambulatory Visit
Admission: RE | Admit: 2016-06-07 | Discharge: 2016-06-07 | Disposition: A | Discharge status: Home / Self Care | Payer: PPO | Source: Ambulatory Visit | Attending: Family Medicine | Admitting: Family Medicine

## 2016-06-07 ENCOUNTER — Ambulatory Visit: Payer: PPO | Admitting: Family Medicine

## 2016-06-07 ENCOUNTER — Other Ambulatory Visit: Payer: Self-pay | Admitting: Family Medicine

## 2016-06-07 DIAGNOSIS — R928 Other abnormal and inconclusive findings on diagnostic imaging of breast: Secondary | ICD-10-CM

## 2016-06-07 DIAGNOSIS — N6489 Other specified disorders of breast: Secondary | ICD-10-CM | POA: Diagnosis not present

## 2016-06-12 ENCOUNTER — Ambulatory Visit: Payer: PPO | Admitting: Cardiology

## 2016-06-14 ENCOUNTER — Encounter: Payer: Self-pay | Admitting: Family Medicine

## 2016-06-14 DIAGNOSIS — N6002 Solitary cyst of left breast: Secondary | ICD-10-CM | POA: Insufficient documentation

## 2016-06-15 ENCOUNTER — Other Ambulatory Visit (INDEPENDENT_AMBULATORY_CARE_PROVIDER_SITE_OTHER): Payer: Self-pay | Admitting: *Deleted

## 2016-06-15 DIAGNOSIS — Z5181 Encounter for therapeutic drug level monitoring: Secondary | ICD-10-CM

## 2016-06-15 NOTE — Telephone Encounter (Signed)
Patient called and states she needs a refill of her Hydrocodone . Patient states you can call her when the RX is ready for pick up. Her number is [phone]. Please advise provider. Thank you

## 2016-06-16 NOTE — Telephone Encounter (Signed)
04/12/16 last visit next visit 09/04/16 due for UDS gave her the order with last RX, no results Will call her about this

## 2016-06-16 NOTE — Telephone Encounter (Signed)
Called patient discussed with her [name] states she will come in Monday have put in order

## 2016-06-18 ENCOUNTER — Encounter: Payer: Self-pay | Admitting: Cardiology

## 2016-06-18 DIAGNOSIS — G4733 Obstructive sleep apnea (adult) (pediatric): Secondary | ICD-10-CM | POA: Diagnosis not present

## 2016-06-18 DIAGNOSIS — R269 Unspecified abnormalities of gait and mobility: Secondary | ICD-10-CM | POA: Diagnosis not present

## 2016-06-20 DIAGNOSIS — Z79899 Other long term (current) drug therapy: Secondary | ICD-10-CM | POA: Diagnosis not present

## 2016-06-20 DIAGNOSIS — H25013 Cortical age-related cataract, bilateral: Secondary | ICD-10-CM | POA: Diagnosis not present

## 2016-06-20 DIAGNOSIS — H35033 Hypertensive retinopathy, bilateral: Secondary | ICD-10-CM | POA: Diagnosis not present

## 2016-06-20 DIAGNOSIS — H40013 Open angle with borderline findings, low risk, bilateral: Secondary | ICD-10-CM | POA: Diagnosis not present

## 2016-06-21 ENCOUNTER — Telehealth: Payer: Self-pay | Admitting: Radiology

## 2016-06-21 NOTE — Telephone Encounter (Signed)
I received correspondence that Last PLQ visual field was in 2014. Patient is sch for VF Dec 2017. However, there is a note in the patients chart from Dr Gershon Crane that VF was normal 04/2015. This patient appears to go back and forth between Dr Gershon Crane and Dr Herbert Deaner

## 2016-06-26 ENCOUNTER — Other Ambulatory Visit: Payer: Self-pay | Admitting: Radiology

## 2016-06-26 DIAGNOSIS — Z5181 Encounter for therapeutic drug level monitoring: Secondary | ICD-10-CM | POA: Diagnosis not present

## 2016-06-27 LAB — PAIN MGMT, PROFILE 5 W/CONF, U
Amphetamines: NEGATIVE ng/mL (ref <500)
BARBITURATES: NEGATIVE ng/mL (ref <300)
BENZODIAZEPINES: NEGATIVE ng/mL (ref <100)
COCAINE METABOLITE: NEGATIVE ng/mL (ref <150)
CREATININE: 40.5 mg/dL (ref >=20.0)
Marijuana Metabolite: NEGATIVE ng/mL (ref <20)
Methadone Metabolite: NEGATIVE ng/mL (ref <100)
OPIATES: NEGATIVE ng/mL (ref <100)
OXYCODONE: NEGATIVE ng/mL (ref <100)
Oxidant: NEGATIVE ug/mL (ref <200)
pH: 4.75 (ref 4.5–9.0)

## 2016-06-30 ENCOUNTER — Telehealth: Payer: Self-pay | Admitting: Rheumatology

## 2016-06-30 ENCOUNTER — Other Ambulatory Visit: Payer: Self-pay | Admitting: Rheumatology

## 2016-06-30 MED ORDER — HYDROCODONE-ACETAMINOPHEN 5-325 MG PO TABS: 1.0000 | ORAL_TABLET | Freq: Every evening | ORAL | 0 refills | Status: DC | PRN

## 2016-06-30 NOTE — Telephone Encounter (Signed)
Patient would like to know if results of drug screen are back yet? Patient states she would like to pick up hydrocodone rx on Monday if possible.

## 2016-06-30 NOTE — Telephone Encounter (Signed)
Prescription printed and it is on your desk  Last prescription was 05/16/2016 so she is able to get this refilled.  Hydrocodone 5/325 1 pill daily at bedtime thirty-day supply with no refills

## 2016-06-30 NOTE — Telephone Encounter (Signed)
Last Visit: 04/12/16 Next Visit: 09/04/16 UDS:06/27/16 Narc Agreement: 02/29/16  Okay to refill Hydrocodone?

## 2016-06-30 NOTE — Telephone Encounter (Signed)
Patient advised her prescription is ready for pick up

## 2016-07-03 LAB — HM DIABETES EYE EXAM

## 2016-07-12 ENCOUNTER — Ambulatory Visit (INDEPENDENT_AMBULATORY_CARE_PROVIDER_SITE_OTHER): Payer: Medicare Other | Admitting: Ophthalmology

## 2016-07-18 ENCOUNTER — Ambulatory Visit (INDEPENDENT_AMBULATORY_CARE_PROVIDER_SITE_OTHER): Payer: PPO | Admitting: Family Medicine

## 2016-07-18 ENCOUNTER — Encounter: Payer: Self-pay | Admitting: Family Medicine

## 2016-07-18 VITALS — BP 120/71 | HR 78 | Temp 98.8°F | Ht 61.0 in | Wt 226.3 lb

## 2016-07-18 DIAGNOSIS — R269 Unspecified abnormalities of gait and mobility: Secondary | ICD-10-CM | POA: Diagnosis not present

## 2016-07-18 DIAGNOSIS — J069 Acute upper respiratory infection, unspecified: Secondary | ICD-10-CM | POA: Diagnosis not present

## 2016-07-18 DIAGNOSIS — R509 Fever, unspecified: Secondary | ICD-10-CM

## 2016-07-18 DIAGNOSIS — J310 Chronic rhinitis: Secondary | ICD-10-CM | POA: Diagnosis not present

## 2016-07-18 DIAGNOSIS — E781 Pure hyperglyceridemia: Secondary | ICD-10-CM

## 2016-07-18 DIAGNOSIS — G4733 Obstructive sleep apnea (adult) (pediatric): Secondary | ICD-10-CM

## 2016-07-18 DIAGNOSIS — R059 Cough, unspecified: Secondary | ICD-10-CM

## 2016-07-18 DIAGNOSIS — R05 Cough: Secondary | ICD-10-CM | POA: Diagnosis not present

## 2016-07-18 DIAGNOSIS — E876 Hypokalemia: Secondary | ICD-10-CM

## 2016-07-18 MED ORDER — PHENYLEPHRINE-CHLORPHEN-DM 10-4-12.5 MG/5ML PO LIQD: 5.0000 mL | Freq: Three times a day (TID) | ORAL | 0 refills | Status: DC | PRN

## 2016-07-18 MED ORDER — PREDNISONE 20 MG PO TABS: 60.0000 mg | ORAL_TABLET | Freq: Every day | ORAL | 0 refills | Status: DC

## 2016-07-18 MED ORDER — AMOXICILLIN-POT CLAVULANATE 875-125 MG PO TABS: 1.0000 | ORAL_TABLET | Freq: Two times a day (BID) | ORAL | 0 refills | Status: AC

## 2016-07-18 MED ORDER — POTASSIUM CHLORIDE CRYS ER 10 MEQ PO TBCR: EXTENDED_RELEASE_TABLET | ORAL | 1 refills | Status: DC

## 2016-07-18 MED ORDER — HYDROCOD POLST-CPM POLST ER 10-8 MG/5ML PO SUER: 5.0000 mL | Freq: Two times a day (BID) | ORAL | 0 refills | Status: DC | PRN

## 2016-07-18 MED ORDER — METHYLPREDNISOLONE ACETATE 40 MG/ML IJ SUSP
40.0000 mg | Freq: Once | INTRAMUSCULAR | Status: AC
  Administered 2016-07-18: 40 mg via INTRAMUSCULAR

## 2016-07-18 NOTE — Patient Instructions (Signed)
Upper Respiratory Infection, Adult Most upper respiratory infections (URIs) are a viral infection of the air passages leading to the lungs. A URI affects the nose, throat, and upper air passages. The most common type of URI is nasopharyngitis and is typically referred to as "the common cold." URIs run their course and usually go away on their own. Most of the time, a URI does not require medical attention, but sometimes a bacterial infection in the upper airways can follow a viral infection. This is called a secondary infection. Sinus and middle ear infections are common types of secondary upper respiratory infections. Bacterial pneumonia can also complicate a URI. A URI can worsen asthma and chronic obstructive pulmonary disease (COPD). Sometimes, these complications can require emergency medical care and may be life threatening. What are the causes? Almost all URIs are caused by viruses. A virus is a type of germ and can spread from one person to another. What increases the risk? You may be at risk for a URI if:  You smoke.  You have chronic heart or lung disease.  You have a weakened defense (immune) system.  You are very young or very old.  You have nasal allergies or asthma.  You work in crowded or poorly ventilated areas.  You work in health care facilities or schools.  What are the signs or symptoms? Symptoms typically develop 2-3 days after you come in contact with a cold virus. Most viral URIs last 7-10 days. However, viral URIs from the influenza virus (flu virus) can last 14-18 days and are typically more severe. Symptoms may include:  Runny or stuffy (congested) nose.  Sneezing.  Cough.  Sore throat.  Headache.  Fatigue.  Fever.  Loss of appetite.  Pain in your forehead, behind your eyes, and over your cheekbones (sinus pain).  Muscle aches.  How is this diagnosed? Your health care provider may diagnose a URI by:  Physical exam.  Tests to check that your  symptoms are not due to another condition such as: ? Strep throat. ? Sinusitis. ? Pneumonia. ? Asthma.  How is this treated? A URI goes away on its own with time. It cannot be cured with medicines, but medicines may be prescribed or recommended to relieve symptoms. Medicines may help:  Reduce your fever.  Reduce your cough.  Relieve nasal congestion.  Follow these instructions at home:  Take medicines only as directed by your health care provider.  Gargle warm saltwater or take cough drops to comfort your throat as directed by your health care provider.  Use a warm mist humidifier or inhale steam from a shower to increase air moisture. This may make it easier to breathe.  Drink enough fluid to keep your urine clear or pale yellow.  Eat soups and other clear broths and maintain good nutrition.  Rest as needed.  Return to work when your temperature has returned to normal or as your health care provider advises. You may need to stay home longer to avoid infecting others. You can also use a face mask and careful hand washing to prevent spread of the virus.  Increase the usage of your inhaler if you have asthma.  Do not use any tobacco products, including cigarettes, chewing tobacco, or electronic cigarettes. If you need help quitting, ask your health care provider. How is this prevented? The best way to protect yourself from getting a cold is to practice good hygiene.  Avoid oral or hand contact with people with cold symptoms.  Wash your   hands often if contact occurs.  There is no clear evidence that vitamin C, vitamin E, echinacea, or exercise reduces the chance of developing a cold. However, it is always recommended to get plenty of rest, exercise, and practice good nutrition. Contact a health care provider if:  You are getting worse rather than better.  Your symptoms are not controlled by medicine.  You have chills.  You have worsening shortness of breath.  You have  brown or red mucus.  You have yellow or brown nasal discharge.  You have pain in your face, especially when you bend forward.  You have a fever.  You have swollen neck glands.  You have pain while swallowing.  You have white areas in the back of your throat. Get help right away if:  You have severe or persistent: ? Headache. ? Ear pain. ? Sinus pain. ? Chest pain.  You have chronic lung disease and any of the following: ? Wheezing. ? Prolonged cough. ? Coughing up blood. ? A change in your usual mucus.  You have a stiff neck.  You have changes in your: ? Vision. ? Hearing. ? Thinking. ? Mood. This information is not intended to replace advice given to you by your health care provider. Make sure you discuss any questions you have with your health care provider. Document Released: 01/24/2001 Document Revised: 04/02/2016 Document Reviewed: 11/05/2013 Elsevier Interactive Patient Education  2017 [employer]  

## 2016-07-18 NOTE — Progress Notes (Signed)
Assessment and plan:  1. Fever, unspecified fever cause   2. Cough   3. Rhinitis, unspecified chronicity, unspecified type   4. Acute upper respiratory infection   5. Hypokalemia   6. OSA (obstructive sleep apnea)   7. Hypertriglyceridemia    1. URI / SINUSITIS  2. Meds per below 3. Counseling done.  Anticipatory guidance and routine counseling done re: condition, txmnt options and need for follow up. All questions of patient's were answered.  - Viral vs Allergic vs Bacterial causes for pt's symptoms reveiwed.    - Supportive care and various OTC medications discussed in addition to any prescribed. - Call or RTC if new symptoms, or if no improvement or worse over next couple days.   - Will consider ABX at that time if sx continue past 5-7 days and worsening.    New Prescriptions   CHLORPHENIRAMINE-HYDROCODONE (TUSSIONEX) 10-8 MG/5ML SUER    Take 5 mLs by mouth every 12 (twelve) hours as needed for cough (cough, will cause drowsiness.).   PHENYLEPHRINE-CHLORPHEN-DM (NOREL CS) 05-18-11.5 MG/5ML LIQD    Take 5 mLs by mouth 3 (three) times daily between meals as needed.   PREDNISONE (DELTASONE) 20 MG TABLET    Take 3 tablets (60 mg total) by mouth daily.    Modified Medications   Modified Medication Previous Medication   LEFLUNOMIDE (ARAVA) 20 MG TABLET leflunomide (ARAVA) 20 MG tablet      TAKE 1 TABLET BY MOUTH DAILY.    Take 20 mg by mouth daily.   POTASSIUM CHLORIDE (K-DUR,KLOR-CON) 10 MEQ TABLET potassium chloride (K-DUR,KLOR-CON) 10 MEQ tablet      Take daily    Take daily   TIZANIDINE (ZANAFLEX) 4 MG TABLET tiZANidine (ZANAFLEX) 4 MG tablet      TAKE 1 TABLET BY MOUTH DAILY AT BEDTIME    Take 1 tablet by mouth as needed.    Discontinued Medications   HYDROXYZINE (ATARAX/VISTARIL) 50 MG TABLET    Take 1 tablet (50 mg total) by mouth at bedtime. For itching     Gross side effects, risk and benefits, and alternatives of medications discussed with patient.  Patient is aware  that all medications have potential side effects and we are unable to predict every sideeffect or drug-drug interaction that may occur.  Expresses verbal understanding and consents to current therapy plan and treatment regiment.  Return for Fasting blood work near future, then office visit with me sometime in January or February..  Please see AVS handed out to patient at the end of our visit for additional patient instructions/ counseling done pertaining to today's office visit.  Note: This document was prepared using Dragon voice recognition software and may include unintentional dictation errors.    Subjective:    Chief Complaint  Patient presents with  . URI    HPI:  Pt presents with URI sx for 3 days.      C/o rhinorrhea, headache, and cough.   + F/ C- 101 last night.  Tylenol, sudafed.    Denies objective F/C, No face pain or ear pain, No N/V/D, No SOB/DIB, No Rash.  Cant sleep; exhausted, RN- pouring    Patient Active Problem List   Diagnosis Date Noted  . Peripheral polyneuropathy ([place]) 02/02/2014    Priority: High  . Morbid obesity ([place]) 11/17/2013    Priority: High  . Breast cyst, left 06/14/2016  . Encounter for wellness examination 05/23/2016  . Abnormality of gait 05/09/2016  . Hypokalemia 03/22/2016  . Sjoegren syndrome (  [place]) 02/29/2016  . On potassium wasting diuretic therapy 02/29/2016  . GERD (gastroesophageal reflux disease) 01/19/2016  . Cough 01/19/2016  . Glucose intolerance (impaired glucose tolerance) 01/19/2016  . Chronic diastolic CHF (congestive heart failure) ([place]) 04/20/2015  . Heart murmur   . OSA (obstructive sleep apnea)   . Arthritis   . Hiatal hernia   . Fibromyalgia   . Peripheral autonomic neuropathy of unknown cause   . PVC's (premature ventricular contractions)   . 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 Osteoarthritis of right knee 06/22/2011    Past medical history, Surgical history, Family history reviewed and  noted below, Social history, Allergies, and Medications have been entered into the medical record, reviewed and changed as needed.   Allergies  Allergen Reactions  . Cymbalta [Duloxetine Hcl]     manic  . Demerol Hives and Other (See Comments)    Fever   . Ivp Dye [Iodinated Diagnostic Agents] Hives and Other (See Comments)    Fever   . Morphine And Related Other (See Comments)    ineffective  . Adhesive [Tape] Rash    Review of Systems  Constitutional: Positive for fever. Negative for chills, diaphoresis, malaise/fatigue and weight loss.  HENT: Positive for congestion and sinus pain. Negative for sore throat and tinnitus.   Eyes: Negative.  Negative for blurred vision, double vision and photophobia.  Respiratory: Positive for cough and sputum production. Negative for wheezing.   Cardiovascular: Negative.  Negative for chest pain and palpitations.  Gastrointestinal: Positive for nausea. Negative for blood in stool, diarrhea and vomiting.  Genitourinary: Negative.  Negative for dysuria, frequency and urgency.  Musculoskeletal: Negative.  Negative for joint pain and myalgias.  Skin: Negative.  Negative for itching and rash.  Neurological: Negative.  Negative for dizziness, focal weakness, weakness and headaches.  Endo/Heme/Allergies: Negative.  Negative for environmental allergies and polydipsia. Does not bruise/bleed easily.  Psychiatric/Behavioral: Negative.  Negative for depression and memory loss. The patient is not nervous/anxious and does not have insomnia.     Objective:   Blood pressure 120/71, pulse 78, temperature 98.8 F (37.1 C), temperature source Oral, height 5\' 1"  (1.549 m), weight 226 lb 4.8 oz (102.6 kg). Body mass index is 42.76 kg/m. General: Well Developed, well nourished, appropriate for stated age.  Neuro: Alert and oriented x3, extra-ocular muscles intact, sensation grossly intact.  HEENT: Normocephalic, atraumatic, pupils equal round reactive to light, neck  supple, no masses, no painful lymphadenopathy- But present bilateral anterior cervical, TM's intact B/L,  no acute findings. Nares- patent, clear d/c, OP- clear, mild erythema, No TTP sinuses Skin: Warm and dry, no gross rash. Cardiac: RRR, S1 S2,  no murmurs rubs or gallops.  Respiratory: ECTA B/L and A/P, Not using accessory muscles, speaking in full sentences- unlabored. Vascular:  No gross lower ext edema, cap RF less 2 sec. Psych: No HI/SI, judgement and insight good, Euthymic mood. Full Affect.   Patient Care Team    Relationship Specialty Notifications Start End  Mellody Dance, DO PCP - General Family Medicine  01/19/16   Bo Merino, MD Consulting Physician Rheumatology  02/02/14   Sueanne Margarita, MD Consulting Physician Cardiology  05/23/16   Gaynelle Arabian, MD Consulting Physician Orthopedic Surgery  05/23/16   Kathrynn Ducking, MD Consulting Physician Neurology  05/23/16   Juanita Craver, MD Consulting Physician Gastroenterology  05/23/16   Amy Martinique, MD Consulting Physician Dermatology  05/23/16

## 2016-07-25 ENCOUNTER — Other Ambulatory Visit: Payer: Self-pay | Admitting: Rheumatology

## 2016-07-25 ENCOUNTER — Other Ambulatory Visit: Payer: PPO

## 2016-07-25 ENCOUNTER — Other Ambulatory Visit (INDEPENDENT_AMBULATORY_CARE_PROVIDER_SITE_OTHER): Payer: PPO

## 2016-07-25 DIAGNOSIS — Z1211 Encounter for screening for malignant neoplasm of colon: Secondary | ICD-10-CM

## 2016-07-25 LAB — POC HEMOCCULT BLD/STL (HOME/3-CARD/SCREEN)
Card #3 Fecal Occult Blood, POC: NEGATIVE
FECAL OCCULT BLD: NEGATIVE
Fecal Occult Blood, POC: NEGATIVE

## 2016-07-25 NOTE — Telephone Encounter (Signed)
Last Visit: 04/12/16 Next Visit: 09/04/16 Labs: 05/10/16 WNL Patient to update labs next week.   Okay to Tizanidine and Arava?

## 2016-07-27 ENCOUNTER — Encounter: Payer: Self-pay | Admitting: Cardiology

## 2016-07-31 ENCOUNTER — Ambulatory Visit (INDEPENDENT_AMBULATORY_CARE_PROVIDER_SITE_OTHER): Payer: PPO | Admitting: Cardiology

## 2016-07-31 ENCOUNTER — Encounter: Payer: Self-pay | Admitting: Cardiology

## 2016-07-31 VITALS — BP 122/74 | HR 68 | Resp 18 | Wt 223.0 lb

## 2016-07-31 DIAGNOSIS — I5032 Chronic diastolic (congestive) heart failure: Secondary | ICD-10-CM

## 2016-07-31 DIAGNOSIS — G4733 Obstructive sleep apnea (adult) (pediatric): Secondary | ICD-10-CM

## 2016-07-31 DIAGNOSIS — I493 Ventricular premature depolarization: Secondary | ICD-10-CM | POA: Diagnosis not present

## 2016-07-31 LAB — BASIC METABOLIC PANEL
BUN: 11 mg/dL (ref 7–25)
CALCIUM: 8.9 mg/dL (ref 8.6–10.4)
CO2: 27 mmol/L (ref 20–31)
CREATININE: 0.62 mg/dL (ref 0.50–0.99)
Chloride: 106 mmol/L (ref 98–110)
Glucose, Bld: 102 mg/dL — ABNORMAL HIGH (ref 65–99)
Potassium: 4.1 mmol/L (ref 3.5–5.3)
Sodium: 142 mmol/L (ref 135–146)

## 2016-07-31 NOTE — Patient Instructions (Signed)

## 2016-07-31 NOTE — Progress Notes (Signed)
Cardiology Office Note    Date:  07/31/2016   ID:  [name], DOB 1950-11-24, MRN [id number]  PCP:  Mellody Dance, DO  Cardiologist:  Fransico Him, MD   Chief Complaint  Patient presents with  . Office Visit    10 week sleep  . Sleep Apnea  . Hypertension    History of Present Illness:  [name] is a 65 y.o. female with a history of PVC's, chronic diastolic CHF, obesity and OSA who presents today for followup. She is doing well.  She tolerates her CPAP without any problems.  She tolerates the full face mask and feels the pressure is adequate.She sleeps well at night and feels rested in the am and has no daytime sleepiness. She denies any chest pain, SOB, DOE, PND, orthopnea, dizziness or syncope. She has chronic PVC's with she feels but they do not bother her.  Past Medical History:  Diagnosis Date  . Arthritis    Rheumatoid arthritis,   . Blood transfusion    1981  . Chronic diastolic CHF (congestive heart failure) ([place])   . Fibromyalgia   . Heart murmur    as a child  . Hiatal hernia    sjorgens syndrome  . OSA (obstructive sleep apnea)   . Peripheral autonomic neuropathy of unknown cause     Past Surgical History:  Procedure Laterality Date  . ABDOMINAL HYSTERECTOMY     BTL, BSO  . CHOLECYSTECTOMY    . DIAGNOSTIC LAPAROSCOPY     x3  . KNEE ARTHROSCOPY     x2  . patotid cystectomy    . TENDON REPAIR  1980   left ankle and tibia  . TONSILLECTOMY    . TOTAL KNEE ARTHROPLASTY  06/21/2011   Procedure: TOTAL KNEE ARTHROPLASTY;  Surgeon: Gearlean Alf;  Location: [facility];  Service: Orthopedics;  Laterality: Right;  . TUBAL LIGATION      Current Medications: Outpatient Medications Prior to Visit  Medication Sig Dispense Refill  . carbamazepine (TEGRETOL) 200 MG tablet Take 1 tablet (200 mg total) by mouth 2 (two) times daily. 60 tablet 5  . Cholecalciferol (VITAMIN D3) 5000 units TABS Take by mouth daily.    . Cholesterol POWD Take by mouth  as needed.     . cycloSPORINE (RESTASIS) 0.05 % ophthalmic emulsion Place 2 drops into both eyes 2 (two) times daily.     . diphenoxylate-atropine (LOMOTIL) 2.5-0.025 MG per tablet Take 1 tablet by mouth 4 (four) times daily as needed for diarrhea or loose stools.    . furosemide (LASIX) 20 MG tablet TAKE 1 TABLET (20 MG TOTAL) BY MOUTH DAILY. 90 tablet 2  . gabapentin (NEURONTIN) 300 MG capsule Take 300 mg by mouth. Take 2 capsules every morning, 2 capsules in the afternoon and 3 capsules at bedtime    . HYDROcodone-acetaminophen (NORCO/VICODIN) 5-325 MG tablet Take 1 tablet by mouth at bedtime as needed. 30 tablet 0  . hydroxychloroquine (PLAQUENIL) 200 MG tablet Take 200 mg by mouth as directed. 1 tab in am and 1/2 tab in pm    . hyoscyamine (LEVBID) 0.375 MG 12 hr tablet Take 0.375 mg by mouth as needed.     . leflunomide (ARAVA) 20 MG tablet TAKE 1 TABLET BY MOUTH DAILY. 90 tablet 0  . lidocaine (LIDODERM) 5 % Place 1 patch onto the skin as needed. Remove & Discard patch within 12 hours or as directed by MD    . metoprolol succinate (TOPROL-XL) 25  MG 24 hr tablet Take 1 tablet (25 mg total) by mouth daily. 90 tablet 2  . Multiple Vit-Min-Calcium-FA (FOLGARD OS) 500-1.1 MG TABS Take 1 tablet by mouth 2 (two) times daily.    . pantoprazole (PROTONIX) 40 MG tablet Take 40 mg by mouth daily.    . potassium chloride (K-DUR,KLOR-CON) 10 MEQ tablet Take daily 90 tablet 1  . Probiotic Product (ALIGN) 4 MG CAPS Take 1 capsule by mouth daily.    . Probiotic Product ([place]) 170 MG CAPS Take 1 capsule by mouth every morning.    . promethazine (PHENERGAN) 25 MG tablet Take 25 mg by mouth every 6 (six) hours as needed.      . silver sulfADIAZINE (SILVADENE) 1 % cream Apply 1 application topically as needed.    . sulfaSALAzine (AZULFIDINE) 500 MG EC tablet Take 1,000 mg by mouth 2 (two) times daily.   2  . tiZANidine (ZANAFLEX) 4 MG tablet TAKE 1 TABLET BY MOUTH DAILY AT BEDTIME 90 tablet  1  . triamcinolone ointment (KENALOG) 0.1 % To affected areas of itching Three times daily (Patient taking differently: as needed. To affected areas of itching Three times daily) 80 g 1  . chlorpheniramine-HYDROcodone (TUSSIONEX) 10-8 MG/5ML SUER Take 5 mLs by mouth every 12 (twelve) hours as needed for cough (cough, will cause drowsiness.). 120 mL 0  . Phenylephrine-Chlorphen-DM (NOREL CS) 05-18-11.5 MG/5ML LIQD Take 5 mLs by mouth 3 (three) times daily between meals as needed. 120 mL 0  . predniSONE (DELTASONE) 20 MG tablet Take 3 tablets (60 mg total) by mouth daily. 15 tablet 0   Facility-Administered Medications Prior to Visit  Medication Dose Route Frequency Provider Last Rate Last Dose  . chlorhexidine (HIBICLENS) 4 % liquid 4 application  60 mL Topical Once Gaynelle Arabian, MD      . chlorhexidine (HIBICLENS) 4 % liquid 4 application  60 mL Topical Once Gaynelle Arabian, MD         Allergies:   Cymbalta [duloxetine hcl]; Demerol; Ivp dye [iodinated diagnostic agents]; Morphine and related; and Adhesive [tape]   Social History   Social History  . Marital status: Married    Spouse name: N/A  . Number of children: 2  . Years of education: AS   Occupational History  . disability   . RN    Social History Main Topics  . Smoking status: Never Smoker  . Smokeless tobacco: Never Used  . Alcohol use Yes     Comment: less than 1 month  . Drug use: No  . Sexual activity: No   Other Topics Concern  . None   Social History Narrative   Lives at home alone   Right-handed   Drinks 1 or less cups of coffee and 2 servings of either tea or soda per day     Family History:  The patient's family history includes Alzheimer's disease in her mother; Cancer in her father; Glaucoma in her brother, brother, and mother; Heart attack in her brother and mother; Heart disease in her brother; Hyperlipidemia in her brother; Hypertension in her mother.   ROS:   Please see the history of present illness.     ROS All other systems reviewed and are negative.  No flowsheet data found.     PHYSICAL EXAM:   VS:  BP 122/74   Pulse 68   Resp 18   Wt 223 lb (101.2 kg)   SpO2 97%   BMI 42.14 kg/m  GEN: Well nourished, well developed, in no acute distress  HEENT: normal  Neck: no JVD, carotid bruits, or masses Cardiac: RRR; no murmurs, rubs, or gallops. Trace LLE edema.  Intact distal pulses bilaterally.  Respiratory:  clear to auscultation bilaterally, normal work of breathing GI: soft, nontender, nondistended, + BS MS: no deformity or atrophy  Skin: warm and dry, no rash Neuro:  Alert and Oriented x 3, Strength and sensation are intact Psych: euthymic mood, full affect  Wt Readings from Last 3 Encounters:  07/31/16 223 lb (101.2 kg)  07/18/16 226 lb 4.8 oz (102.6 kg)  05/23/16 229 lb 11.2 oz (104.2 kg)      Studies/Labs Reviewed:   EKG:  EKG is not ordered today.    Recent Labs: 02/22/2016: Hemoglobin 13.6 05/09/2016: ALT 13; BUN 9; Creatinine, Ser 0.60; Platelets 180; Potassium 4.1; Sodium 143 05/23/2016: TSH 0.90   Lipid Panel    Component Value Date/Time   CHOL 195 05/23/2016 1426   TRIG 323 (H) 05/23/2016 1426   HDL 41 (L) 05/23/2016 1426   CHOLHDL 4.8 05/23/2016 1426   VLDL 65 (H) 05/23/2016 1426   LDLCALC 89 05/23/2016 1426    Additional studies/ records that were reviewed today include:  CPAP download    ASSESSMENT:    1. OSA (obstructive sleep apnea)   2. Chronic diastolic CHF (congestive heart failure) ([place])   3. PVC's (premature ventricular contractions)   4. Morbid obesity ([place])      PLAN:  In order of problems listed above:  OSA - the patient is tolerating PAP therapy well without any problems. The PAP download was reviewed today and showed an AHI of 3.5/hr on 12 cm H2O with 100% compliance in using more than 4 hours nightly.  The patient has been using and benefiting from CPAP use and will continue to benefit from therapy.  Chronic diastolic  CHF - she appears euvolemic on exam. Continue Lasix/BB.  Check BMET. PVCs - controlled on BB therapy. Morbid obesity - she is limited to exercise due to her scoliosis and neuropathy    Medication Adjustments/Labs and Tests Ordered: Current medicines are reviewed at length with the patient today.  Concerns regarding medicines are outlined above.  Medication changes, Labs and Tests ordered today are listed in the Patient Instructions below.  There are no Patient Instructions on file for this visit.   Signed, Fransico Him, MD  07/31/2016 9:38 AM    [address], [place], [place]  [address] Phone: [phone]; Fax: [phone]

## 2016-08-08 DIAGNOSIS — Z79899 Other long term (current) drug therapy: Secondary | ICD-10-CM | POA: Diagnosis not present

## 2016-08-08 DIAGNOSIS — H40013 Open angle with borderline findings, low risk, bilateral: Secondary | ICD-10-CM | POA: Diagnosis not present

## 2016-08-08 DIAGNOSIS — M069 Rheumatoid arthritis, unspecified: Secondary | ICD-10-CM | POA: Diagnosis not present

## 2016-08-11 ENCOUNTER — Other Ambulatory Visit (INDEPENDENT_AMBULATORY_CARE_PROVIDER_SITE_OTHER): Payer: PPO

## 2016-08-11 DIAGNOSIS — E781 Pure hyperglyceridemia: Secondary | ICD-10-CM

## 2016-08-12 LAB — CBC WITH DIFFERENTIAL/PLATELET
BASOS ABS: 44 {cells}/uL (ref 0–200)
Basophils Relative: 1 %
Eosinophils Absolute: 88 cells/uL (ref 15–500)
Eosinophils Relative: 2 %
HEMATOCRIT: 41.1 % (ref 35.0–45.0)
HEMOGLOBIN: 13.2 g/dL (ref 11.7–15.5)
LYMPHS ABS: 1232 {cells}/uL (ref 850–3900)
Lymphocytes Relative: 28 %
MCH: 28.4 pg (ref 27.0–33.0)
MCHC: 32.1 g/dL (ref 32.0–36.0)
MCV: 88.4 fL (ref 80.0–100.0)
MONO ABS: 484 {cells}/uL (ref 200–950)
MPV: 11 fL (ref 7.5–12.5)
Monocytes Relative: 11 %
NEUTROS PCT: 58 %
Neutro Abs: 2552 cells/uL (ref 1500–7800)
Platelets: 196 10*3/uL (ref 140–400)
RBC: 4.65 MIL/uL (ref 3.80–5.10)
RDW: 13.6 % (ref 11.0–15.0)
WBC: 4.4 10*3/uL (ref 3.8–10.8)

## 2016-08-12 LAB — COMPLETE METABOLIC PANEL WITH GFR
ALBUMIN: 3.9 g/dL (ref 3.6–5.1)
ALK PHOS: 83 U/L (ref 33–130)
ALT: 11 U/L (ref 6–29)
AST: 16 U/L (ref 10–35)
BILIRUBIN TOTAL: 0.3 mg/dL (ref 0.2–1.2)
BUN: 8 mg/dL (ref 7–25)
CALCIUM: 9.1 mg/dL (ref 8.6–10.4)
CO2: 26 mmol/L (ref 20–31)
Chloride: 108 mmol/L (ref 98–110)
Creat: 0.56 mg/dL (ref 0.50–0.99)
GFR, Est African American: >89 mL/min (ref >=60)
Glucose, Bld: 81 mg/dL (ref 65–99)
POTASSIUM: 4.3 mmol/L (ref 3.5–5.3)
Sodium: 142 mmol/L (ref 135–146)
TOTAL PROTEIN: 6.2 g/dL (ref 6.1–8.1)

## 2016-08-12 LAB — LIPID PANEL
CHOL/HDL RATIO: 4.7 ratio (ref <5.0)
CHOLESTEROL: 201 mg/dL — AB (ref <200)
HDL: 43 mg/dL — ABNORMAL LOW (ref >50)
LDL Cholesterol: 99 mg/dL (ref <100)
Triglycerides: 293 mg/dL — ABNORMAL HIGH (ref <150)
VLDL: 59 mg/dL — ABNORMAL HIGH (ref <30)

## 2016-08-17 ENCOUNTER — Other Ambulatory Visit: Payer: Self-pay | Admitting: Rheumatology

## 2016-08-17 DIAGNOSIS — R269 Unspecified abnormalities of gait and mobility: Secondary | ICD-10-CM | POA: Diagnosis not present

## 2016-08-17 DIAGNOSIS — G4733 Obstructive sleep apnea (adult) (pediatric): Secondary | ICD-10-CM | POA: Diagnosis not present

## 2016-08-17 MED ORDER — HYDROCODONE-ACETAMINOPHEN 5-325 MG PO TABS: 1.0000 | ORAL_TABLET | Freq: Every evening | ORAL | 0 refills | Status: DC | PRN

## 2016-08-17 NOTE — Telephone Encounter (Signed)
Patient states she had labs done last week and they are available to view through Epic.   She is also requesting refill of hydrocodone and is requesting to pick that up tomorrow afternoon.

## 2016-08-17 NOTE — Telephone Encounter (Signed)
Last Visit: 04/12/16 Next Visit: 09/04/16 UDS:06/27/16 Narc Agreement: 02/29/16  Okay to refill Hydrocodone?

## 2016-08-18 DIAGNOSIS — G4733 Obstructive sleep apnea (adult) (pediatric): Secondary | ICD-10-CM | POA: Diagnosis not present

## 2016-08-18 DIAGNOSIS — R269 Unspecified abnormalities of gait and mobility: Secondary | ICD-10-CM | POA: Diagnosis not present

## 2016-08-18 NOTE — Telephone Encounter (Signed)
Patient advised prescription ready for pick up

## 2016-08-23 ENCOUNTER — Ambulatory Visit: Payer: PPO | Admitting: Cardiology

## 2016-08-23 ENCOUNTER — Encounter: Payer: Self-pay | Admitting: Family Medicine

## 2016-08-23 ENCOUNTER — Ambulatory Visit (INDEPENDENT_AMBULATORY_CARE_PROVIDER_SITE_OTHER): Payer: PPO | Admitting: Family Medicine

## 2016-08-23 VITALS — BP 122/75 | HR 67 | Resp 16 | Wt 224.0 lb

## 2016-08-23 DIAGNOSIS — E782 Mixed hyperlipidemia: Secondary | ICD-10-CM | POA: Diagnosis not present

## 2016-08-23 DIAGNOSIS — R7302 Impaired glucose tolerance (oral): Secondary | ICD-10-CM | POA: Diagnosis not present

## 2016-08-23 DIAGNOSIS — M069 Rheumatoid arthritis, unspecified: Secondary | ICD-10-CM

## 2016-08-23 DIAGNOSIS — E876 Hypokalemia: Secondary | ICD-10-CM

## 2016-08-23 DIAGNOSIS — R748 Abnormal levels of other serum enzymes: Secondary | ICD-10-CM | POA: Diagnosis not present

## 2016-08-23 MED ORDER — NIACIN ER (ANTIHYPERLIPIDEMIC) 1000 MG PO TBCR: 1000.0000 mg | EXTENDED_RELEASE_TABLET | Freq: Every day | ORAL | 1 refills | Status: DC

## 2016-08-23 NOTE — Assessment & Plan Note (Signed)
Potassium within normal limits at 4.3.[name]

## 2016-08-23 NOTE — Progress Notes (Signed)
Impression and Recommendations:    1. Elevated triglycerides with high cholesterol   2. Low serum HDL   3. Glucose intolerance (impaired glucose tolerance)   4. Morbid obesity ([place])   5. Rheumatoid arthritis involving multiple sites, unspecified rheumatoid factor presence ([place])   6. Hypokalemia     Morbid obesity ([place]) Counseling done- advised wt loss.  Explained to patient that weight loss is 80-90% of what you eat much removed. Encouraged to join Weight Watchers  If interested in actively managing weight:  Use lose it or my fitness pal---> track all food and drinks.   Discussed with patient importance of weight loss to help achieve health goals and how increasing weight, correlates to increasing risk of various diseases. (or increasing risk of not controlling existing diseases.)  F/up sooner than planned if you would like to further discuss strategies to lose weight  - Weight watchers recommended;   Declines nutrition counseling with dietary specialist  Glucose intolerance (impaired glucose tolerance) A1c was 4.5 when last checked in October 2017.  We'll continue to monitor yearly    Elevated triglycerides with high cholesterol Niacin CR given.  Did discuss with the patient sensation of flushing- take aspirin 45 minutes prior to niacin   Prudent diet and exercise discussed.   Handouts on how to lower triglycerides given to patient.   Low serum HDL Eat foods high in mono unsaturated and polyunsaturated fats.  Try to move more.  Rheumatoid arthritis ([place]) Patient understands that her being overweight is making her fibromyalgia and chronic joint pains worse  Hypokalemia Potassium within normal limits at 4.3..    No orders of the defined types were placed in this encounter.    New Prescriptions   NIACIN (NIASPAN) 1000 MG CR TABLET    Take 1 tablet (1,000 mg total) by mouth at bedtime.    Modified Medications   No medications on file    Discontinued  Medications   No medications on file    The patient was counseled, risk factors were discussed, anticipatory guidance given.  Gross side effects, risk and benefits, and alternatives of medications and treatment plan in general discussed with patient.  Patient is aware that all medications have potential side effects and we are unable to predict every side effect or drug-drug interaction that may occur.   Patient will call with any questions prior to using medication if they have concerns.  Expresses verbal understanding and consents to current therapy and treatment regimen.  No barriers to understanding were identified.  Red flag symptoms and signs discussed in detail.  Patient expressed understanding regarding what to do in case of emergency\urgent symptoms  Return in about 6 months (around 02/20/2017).  Please see AVS handed out to patient at the end of our visit for further patient instructions/ counseling done pertaining to today's office visit.    Note: This document was prepared using Dragon voice recognition software and may include unintentional dictation errors.   --------------------------------------------------------------------------------------------------------------------------------------------------------------------------------------------------------------------------------------------    Subjective:    CC:  Chief Complaint  Patient presents with  . Follow-up    HPI: [name] is a 66 y.o. female who presents to [place] at [facility] today for issues as discussed below.   Here to review most recent labs were done.  Lipid panel, CBC, CMP all below.  Patient has some complaints about her peripheral neuropathy _0 fibromyalgia-this significantly limits her ability to walk and exercise per patient.   She's  taking medicines from her rheumatologist and is due to follow up with Dr. Patrecia Pour very soon.   She had some chol abn and has not been eating  well with the holidays and all.   Hypertension-tolerating metoprolol well but patient states she takes this for her PVCs and elevated heart rate.   CBC with Differential/Platelet     Status: None   Collection Time: 08/11/16 10:17 AM  Result Value Ref Range   WBC 4.4 3.8 - 10.8 K/uL   RBC 4.65 3.80 - 5.10 MIL/uL   Hemoglobin 13.2 11.7 - 15.5 g/dL   HCT 41.1 35.0 - 45.0 %   MCV 88.4 80.0 - 100.0 fL   MCH 28.4 27.0 - 33.0 pg   MCHC 32.1 32.0 - 36.0 g/dL   RDW 13.6 11.0 - 15.0 %   Platelets 196 140 - 400 K/uL   MPV 11.0 7.5 - 12.5 fL   Neutro Abs 2,552 1,500 - 7,800 cells/uL   Lymphs Abs 1,232 850 - 3,900 cells/uL   Monocytes Absolute 484 200 - 950 cells/uL   Eosinophils Absolute 88 15 - 500 cells/uL   Basophils Absolute 44 0 - 200 cells/uL   Neutrophils Relative % 58 %   Lymphocytes Relative 28 %   Monocytes Relative 11 %   Eosinophils Relative 2 %   Basophils Relative 1 %   Smear Review Criteria for review not met   COMPLETE METABOLIC PANEL WITH GFR     Status: None   Collection Time: 08/11/16 10:17 AM  Result Value Ref Range   Sodium 142 135 - 146 mmol/L   Potassium 4.3 3.5 - 5.3 mmol/L   Chloride 108 98 - 110 mmol/L   CO2 26 20 - 31 mmol/L   Glucose, Bld 81 65 - 99 mg/dL   BUN 8 7 - 25 mg/dL   Creat 0.56 0.50 - 0.99 mg/dL    Comment:   For patients > or = 65 years of age: The upper reference limit for Creatinine is approximately 13% higher for people identified as African-American.      Total Bilirubin 0.3 0.2 - 1.2 mg/dL   Alkaline Phosphatase 83 33 - 130 U/L   AST 16 10 - 35 U/L   ALT 11 6 - 29 U/L   Total Protein 6.2 6.1 - 8.1 g/dL   Albumin 3.9 3.6 - 5.1 g/dL   Calcium 9.1 8.6 - 10.4 mg/dL   GFR, Est African American >89 >=60 mL/min   GFR, Est Non African American >89 >=60 mL/min  Lipid panel     Status: Abnormal   Collection Time: 08/11/16 10:17 AM  Result Value Ref Range   Cholesterol 201 (H) <200 mg/dL   Triglycerides 293 (H) <150 mg/dL   HDL 43 (L)  >50 mg/dL   Total CHOL/HDL Ratio 4.7 <5.0 Ratio   VLDL 59 (H) <30 mg/dL   LDL Cholesterol 99 <100 mg/dL     Wt Readings from Last 3 Encounters:  08/23/16 224 lb (101.6 kg)  07/31/16 223 lb (101.2 kg)  07/18/16 226 lb 4.8 oz (102.6 kg)   BP Readings from Last 3 Encounters:  08/23/16 122/75  07/31/16 122/74  07/18/16 120/71   Pulse Readings from Last 3 Encounters:  08/23/16 67  07/31/16 68  07/18/16 78   BMI Readings from Last 3 Encounters:  08/23/16 42.32 kg/m  07/31/16 42.14 kg/m  07/18/16 42.76 kg/m     Patient Care Team    Relationship Specialty Notifications Start End  Neoma Laming  Raliegh Scarlet, DO PCP - General Family Medicine  01/19/16   Bo Merino, MD Consulting Physician Rheumatology  02/02/14   Sueanne Margarita, MD Consulting Physician Cardiology  05/23/16   Gaynelle Arabian, MD Consulting Physician Orthopedic Surgery  05/23/16   Kathrynn Ducking, MD Consulting Physician Neurology  05/23/16   Juanita Craver, MD Consulting Physician Gastroenterology  05/23/16   Amy Martinique, Central Physician Dermatology  05/23/16     Patient Active Problem List   Diagnosis Date Noted  . Elevated triglycerides with high cholesterol 08/23/2016    Priority: High  . Peripheral polyneuropathy ([place]) 02/02/2014    Priority: High  . Morbid obesity ([place]) 11/17/2013    Priority: High  . OSA (obstructive sleep apnea)     Priority: High  . Low serum HDL 08/23/2016    Priority: Medium  . GERD (gastroesophageal reflux disease) 01/19/2016    Priority: Medium  . Fibromyalgia     Priority: Medium  . 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 On potassium wasting diuretic therapy 02/29/2016    Priority: Low  . Glucose intolerance (impaired glucose tolerance) 01/19/2016    Priority: Low  . Chronic diastolic CHF (congestive heart failure) ([place]) 04/20/2015    Priority: Low  . Rheumatoid arthritis ([place])     Priority: Low  . Peripheral autonomic neuropathy of unknown cause      Priority: Low  . Breast cyst, left 06/14/2016  . Encounter for wellness examination 05/23/2016  . Abnormality of gait 05/09/2016  . Hypokalemia 03/22/2016  . Heart murmur   . Hiatal hernia   . PVC's (premature ventricular contractions)   . 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 Osteoarthritis of right knee 06/22/2011    Past Medical history, Surgical history, Family history, Social history, Allergies and Medications have been entered into the medical record, reviewed and changed as needed.   Allergies:  Allergies  Allergen Reactions  . Cymbalta [Duloxetine Hcl]     manic  . Demerol Hives and Other (See Comments)    Fever   . Ivp Dye [Iodinated Diagnostic Agents] Hives and Other (See Comments)    Fever   . Morphine And Related Other (See Comments)    ineffective  . Adhesive [Tape] Rash    Review of Systems  Musculoskeletal: Positive for joint pain and myalgias.       Chronic pain due to OA and fibromyalgia.     Objective:   Blood pressure 122/75, pulse 67, resp. rate 16, weight 224 lb (101.6 kg), SpO2 96 %. Body mass index is 42.32 kg/m. General: Well Developed, well nourished, appropriate for stated age.  Neuro: Alert and oriented x3, extra-ocular muscles intact, sensation grossly intact.  HEENT: Normocephalic, atraumatic, neck supple, no carotid bruits appreciated  Skin: no gross rash. Cardiac: RRR, S1 S2 Respiratory: ECTA B/L, Not using accessory muscles, speaking in full sentences-unlabored. Vascular:  Ext warm, dry, pink; cap RF less 2 sec. Psych: No HI/SI, judgement and insight good, Euthymic mood. Full Affect.

## 2016-08-23 NOTE — Assessment & Plan Note (Signed)
Patient understands that her being overweight is making her fibromyalgia and chronic joint pains worse

## 2016-08-23 NOTE — Assessment & Plan Note (Signed)
Niacin CR given.  Did discuss with the patient sensation of flushing- take aspirin 45 minutes prior to niacin   Prudent diet and exercise discussed.   Handouts on how to lower triglycerides given to patient.

## 2016-08-23 NOTE — Assessment & Plan Note (Addendum)
A1c was 4.5 when last checked in October 2017.  We'll continue to monitor yearly

## 2016-08-23 NOTE — Assessment & Plan Note (Addendum)
Counseling done- advised wt loss.  Explained to patient that weight loss is 80-90% of what you eat much removed. Encouraged to join Weight Watchers  If interested in actively managing weight:  Use lose it or my fitness pal---> track all food and drinks.   Discussed with patient importance of weight loss to help achieve health goals and how increasing weight, correlates to increasing risk of various diseases. (or increasing risk of not controlling existing diseases.)  F/up sooner than planned if you would like to further discuss strategies to lose weight  - Weight watchers recommended;   Declines nutrition counseling with dietary specialist

## 2016-08-23 NOTE — Patient Instructions (Addendum)
The quick and dirty--> lower triglyceride levels more through...  1) - Beware of bad fats: Cutting back on saturated fat (in red meat and full-fat dairy foods) and trans fats (in restaurant fried foods and commercially prepared baked goods) can lower triglycerides.  2) - Go for good carbs: Easily digested carbohydrates (such as white bread, white rice, cornflakes, and sugary sodas) give triglycerides a definite boost.   3) - Eating whole grains and cutting back on soda can help control triglycerides.  4) - Check your alcohol use. In some people, alcohol dramatically boosts triglycerides. The only way to know if this is true for you is to avoid alcohol for a few weeks and have your triglycerides tested again.  5) - Go fish. Omega-3 fats in salmon, tuna, sardines, and other fatty fish can lower triglycerides. Having fish twice a week is fine.  6) - Aim for a healthy weight. If you are overweight, losing just 5% to 10% of your weight can help drive down triglycerides.  7) - Get moving. Exercise lowers triglycerides and boosts heart-healthy HDL cholesterol.  8) - quit smoking if you do    For more detailed info----->   For those diagnosed with high triglycerides, it's important to take action to lower your levels and improve your heart health.  Triglyceride is just a fancy word for fat - the fat in our bodies is stored in the form of triglycerides. Triglycerides are found in foods and manufactured in our bodies.  Normal triglyceride levels are defined as less than 150 mg/dL; 150 to 199 is considered borderline high; 200 to 499 is high; and 500 or higher is officially called very high. To me, anything over 150 is a red flag indicating my patient needs to take immediate steps to get the situation under control.   What is the significance of high triglycerides? High triglyceride levels make blood thicker and stickier, which means that it is more likely to form clots. Studies have shown  that triglyceride levels are associated with increased risks of cardiovascular disease and stroke - in both men and women - alone or in combination with other risk factors (high triglycerides combined with high LDL cholesterol can be a particularly deadly combination). For example, in one ground-breaking study, high triglycerides alone increased the risk of cardiovascular disease by 14 percent in men, and by 45 percent in women. But when the test subjects also had low HDL cholesterol (that's the good cholesterol) and other risk factors, high triglycerides increased the risk of disease by 32 percent in men and 76 percent in women.   Fortunately, triglycerides can sometimes be controlled with several diet and lifestyle changes.    What Factors Can Increase Triglycerides? As with cholesterol, eating too much of the wrong kinds of fats will raise your blood triglycerides.  Therefore, it's important to restrict the amounts of saturated fats and trans fats you allow into your diet.  Triglyceride levels can also shoot up after eating foods that are high in carbohydrates or after drinking alcohol.  That's why triglyceride blood tests require an overnight fast.  If you have elevated triglycerides, it's especially important to avoid sugary and refined carbohydrates, including sugar, honey, and other sweeteners, soda and other sugary drinks, candy, baked goods, and anything made with white (refined or enriched) flour, including white bread, rolls, cereals, buns, pastries, regular pasta, and white rice.  You'll also want to limit dried fruit and fruit juice since they're dense in simple sugar.  All of these low-quality carbs cause a sudden rise in insulin, which may lead to a spike in triglycerides.  Triglycerides can also become elevated as a reaction to having diabetes, hypothyroidism, or kidney disease. As with most other heart-related factors, being overweight and inactive also contribute to abnormal triglycerides.  And unfortunately, some people have a genetic predisposition that causes them to manufacture way too much triglycerides on their own, no matter how carefully they eat.   How Can You Lower Your Triglyceride Levels? If you are diagnosed with high triglycerides, it's important to take action. There are several things you can do to help lower your triglyceride levels and improve your heart health:  Lose weight if you are overweight.  There is a clear correlation between obesity and high triglycerides - the heavier people are, the higher their triglyceride levels are likely to be. The good news is that losing weight can significantly lower triglycerides. In a large study of individuals with type 2 diabetes, those assigned to the "lifestyle intervention group" - which involved counseling, a low-calorie meal plan, and customized exercise program - lost 8.6% of their body weight and lowered their triglyceride levels by more than 16%. If you're overweight, find a weight loss plan that works for you and commit to shedding the pounds and getting healthier.  Reduce the amount of saturated fat and trans fat in your diet.  Start by avoiding or dramatically limiting butter, cream cheese, lard, sour cream, doughnuts, cakes, cookies, candy bars, regular ice cream, fried foods, pizza, cheese sauce, cream-based sauces and salad dressings, high-fat meats (including fatty hamburgers, bologna, pepperoni, sausage, bacon, salami, pastrami, spareribs, and hot dogs), high-fat cuts of beef and pork, and whole-milk dairy products.   Other ways to cut back: Choose lean meats only (including skinless chicken and [place], lean beef, lean pork), fish, and reduced-fat or fat-free dairy products.   Experiment with adding whole soy foods to your diet. Although soy itself may not reduce risk of heart disease, it replaces hazardous animal fats with healthier proteins. Choose high-quality soy foods, such as tofu, tempeh, soy milk, and  edamame (whole soybeans).  Always remove skin from poultry.  Prepare foods by baking, roasting, broiling, boiling, poaching, steaming, grilling, or stir-frying in vegetable oil.  Most stick margarines contain trans fats, and trans fats are also found in some packaged baked goods, potato chips, snack foods, fried foods, and fast food that use or create hydrogenated oils.    (All food labels must now list the amount of trans fats, right after the amount of saturated fats - good news for consumers. As a result, many food companies have now reformulated their products to be trans fat free.many, but not all! So it's still just as important to read labels and make sure the packaged foods you buy don't contain trans fats.)     If you use margarine, purchase soft-tub margarine spreads that contain 0 grams trans fats and don't list any partially hydrogenated oils in the ingredients list. By substituting olive oil or vegetable oil for trans fats in just 2 percent of your daily calories, you can reduce your risk of heart disease by 53 percent.   There is no safe amount of trans fats, so try to keep them as far from your plate as possible.  Avoid foods that are concentrated in sugar (even dried fruit and fruit juice). Sugary foods can elevate triglyceride levels in the blood, so keep them to a bare minimum.  Swap out refined  carbohydrates for whole grains.  Refined carbohydrates - like white rice, regular pasta, and anything made with white or "enriched" flour (including white bread, rolls, cereals, buns, and crackers) - raise blood sugar and insulin levels more than fiber-rich whole grains. Higher insulin levels, in turn, can lead to a higher rise in triglycerides after a meal. So, make the switch to whole wheat bread, whole grain pasta, brown or wild rice, and whole grain versions of cereals, crackers, and other bread products. However, it's important to know that individuals with high triglycerides should  moderate even their intake of high-quality starches (since all starches raise blood sugar) - I recommend 1 to 2 servings per meal.  Cut way back on alcohol.  If you have high triglycerides, alcohol should be considered a rare treat - if you indulge at all, since even small amounts of alcohol can dramatically increase triglyceride levels.  Incorporate omega-3 fats.  Heart-healthy fish oils are especially rich in omega-3 fatty acids. In multiple studies over the past two decades, people who ate diets high in omega-3s had 30 to 40 percent reductions in heart disease. Although we don't yet know why fish oil works so well, there are several possibilities. Omega-3s seem to reduce inflammation, reduce high blood pressure, decrease triglycerides, raise HDL cholesterol, and make blood thinner and less sticky so it is less likely to clot. It's as close to a food prescription for heart health as it gets. If you have high triglycerides, I recommend eating at least three servings of one of the omega-3-rich fish every week (fatty fish is the most concentrated food form of omega three fats). If you cannot manage to eat that much fish, speak with your physician about taking fish oil capsules, which offer similar benefits.The best foods for omega-3 fatty acids include wild salmon (fresh, canned), herring, mackerel (not king), sardines, anchovies, rainbow trout, and Pacific oysters. Non-fish sources of omega-3 fats include omega-3-fortified eggs, ground flaxseed, chia seeds, walnuts, butternuts (white walnuts), seaweed, walnut oil, canola oil, and soybeans.  Quit smoking.  Smoking causes inflammation, not just in your lungs, but throughout your body. Inflammation can contribute to atherosclerosis, blood clots, and risk of heart attack. Smoking makes all heart health indicators worse. If you have high cholesterol, high triglycerides, or high blood pressure, smoking magnifies the danger.  Become more physically active.   Even moderate exercise can help improve cholesterol, triglycerides, and blood pressure. Aerobic exercise seems to be able to stop the sharp rise of triglycerides after eating, perhaps because of a decrease in the amount of triglyceride released by the liver, or because active muscle clears triglycerides out of the blood stream more quickly than inactive muscle. If you haven't exercised regularly (or at all) for years, I recommend starting slowly, by walking at an easy pace for 15 minutes a day. Then, as you feel more comfortable, increase the amount. Your ultimate goal should be at least 30 minutes of moderate physical activity, at least five days a week.     Guidelines for Losing Weight  We want weight loss that will last so you should lose 1-2 pounds a week.  THAT IS IT! Please pick THREE things a month to change. Once it is a habit check off the item. Then pick another three items off the list to become habits.  If you are already doing a habit on the list GREAT!  Cross that item off!  Don't drink your calories. Ie, alcohol, soda, fruit juice, and sweet tea.  Drink more water. Drink a glass when you feel hungry or before each meal.   Eat breakfast - Complex carb and protein (likeDannon light and fit yogurt, oatmeal, fruit, eggs, [place] bacon).  Measure your cereal.  Eat no more than one cup a day. (ie Kashi)  Eat an apple a day.  Add a vegetable a day.  Try a new vegetable a month.  Use Pam! Stop using oil or butter to cook.  Don't finish your plate or use smaller plates.  Share your dessert.  Eat sugar free Jello for dessert or frozen grapes.  Don't eat 2-3 hours before bed.  Switch to whole wheat bread, pasta, and brown rice.  Make healthier choices when you eat out. No fries!  Pick baked chicken, NOT fried.  Don't forget to SLOW DOWN when you eat. It is not going anywhere.   Take the stairs.  Park far away in the parking lot  Lift soup cans (or weights) for 10  minutes while watching TV.  Walk at work for 10 minutes during break.  Walk outside 1 time a week with your friend, kids, dog, or significant other.  Start a walking group at church.  Walk the mall as much as you can tolerate.   Keep a food diary.  Weigh yourself daily.  Walk for 15 minutes 3 days per week.  Cook at home more often and eat out less. If life happens and you go back to old habits, it is okay.  Just start over. You can do it!  If you experience chest pain, get short of breath, or tired during the exercise, please stop immediately and inform your doctor.    Before you even begin to attack a weight-loss plan, it pays to remember this: You are not fat. You have fat. Losing weight isn't about blame or shame; it's simply another achievement to accomplish. Dieting is like any other skill-you have to buckle down and work at it. As long as you act in a smart, reasonable way, you'll ultimately get where you want to be. Here are some weight loss pearls for you.   1. It's Not a Diet. It's a Lifestyle Thinking of a diet as something you're on and suffering through only for the short term doesn't work. To shed weight and keep it off, you need to make permanent changes to the way you eat. It's OK to indulge occasionally, of course, but if you cut calories temporarily and then revert to your old way of eating, you'll gain back the weight quicker than you can say yo-yo. Use it to lose it. Research shows that one of the best predictors of long-term weight loss is how many pounds you drop in the first month. For that reason, nutritionists often suggest being stricter for the first two weeks of your new eating strategy to build momentum. Cut out added sugar and alcohol and avoid unrefined carbs. After that, figure out how you can reincorporate them in a way that's healthy and maintainable.  2. There's a Right Way to Exercise Working out burns calories and fat and boosts your metabolism by  building muscle. But those trying to lose weight are notorious for overestimating the number of calories they burn and underestimating the amount they take in. Unfortunately, your system is biologically programmed to hold on to extra pounds and that means when you start exercising, your body senses the deficit and ramps up its hunger signals. If you're not diligent, you'll eat everything you burn  and then some. Use it, to lose it. Cardio gets all the exercise glory, but strength and interval training are the real heroes. They help you build lean muscle, which in turn increases your metabolism and calorie-burning ability 3. Don't Overreact to Mild Hunger Some people have a hard time losing weight because of hunger anxiety. To them, being hungry is bad-something to be avoided at all costs-so they carry snacks with them and eat when they don't need to. Others eat because they're stressed out or bored. While you never want to get to the point of being ravenous (that's when bingeing is likely to happen), a hunger pang, a craving, or the fact that it's 3:00 p.m. should not send you racing for the vending machine or obsessing about the energy bar in your purse. Ideally, you should put off eating until your stomach is growling and it's difficult to concentrate.  Use it to lose it. When you feel the urge to eat, use the HALT method. Ask yourself, Am I really hungry? Or am I angry or anxious, lonely or bored, or tired? If you're still not certain, try the apple test. If you're truly hungry, an apple should seem delicious; if it doesn't, something else is going on. Or you can try drinking water and making yourself busy, if you are still hungry try a healthy snack.  4. Not All Calories Are Created Equal The mechanics of weight loss are pretty simple: Take in fewer calories than you use for energy. But the kind of food you eat makes all the difference. Processed food that's high in saturated fat and refined starch or sugar  can cause inflammation that disrupts the hormone signals that tell your brain you're full. The result: You eat a lot more.  Use it to lose it. Clean up your diet. Swap in whole, unprocessed foods, including vegetables, lean protein, and healthy fats that will fill you up and give you the biggest nutritional bang for your calorie buck. In a few weeks, as your brain starts receiving regular hunger and fullness signals once again, you'll notice that you feel less hungry overall and naturally start cutting back on the amount you eat.  5. Protein, Produce, and Plant-Based Fats Are Your Weight-Loss Trinity Here's why eating the three Ps regularly will help you drop pounds. Protein fills you up. You need it to build lean muscle, which keeps your metabolism humming so that you can torch more fat. People in a weight-loss program who ate double the recommended daily allowance for protein (about 110 grams for a 150-pound woman) lost 70 percent of their weight from fat, while people who ate the RDA lost only about 40 percent, one study found. Produce is packed with filling fiber. "It's very difficult to consume too many calories if you're eating a lot of vegetables. Example: Three cups of broccoli is a lot of food, yet only 93 calories. (Fruit is another story. It can be easy to overeat and can contain a lot of calories from sugar, so be sure to monitor your intake.) Plant-based fats like olive oil and those in avocados and nuts are healthy and extra satiating.  Use it to lose it. Aim to incorporate each of the three Ps into every meal and snack. People who eat protein throughout the day are able to keep weight off, according to a study in the [place] of Clinical Nutrition. In addition to meat, poultry and seafood, good sources are beans, lentils, eggs, tofu, and yogurt. As  for fat, keep portion sizes in check by measuring out salad dressing, oil, and nut butters (shoot for one to two tablespoons). Finally, eat  veggies or a little fruit at every meal. People who did that consumed 308 fewer calories but didn't feel any hungrier than when they didn't eat more produce.  7. How You Eat Is As Important As What You Eat In order for your brain to register that you're full, you need to focus on what you're eating. Sit down whenever you eat, preferably at a table. Turn off the TV or computer, put down your phone, and look at your food. Smell it. Chew slowly, and don't put another bite on your fork until you swallow. When women ate lunch this attentively, they consumed 30 percent less when snacking later than those who listened to an audiobook at lunchtime, according to a study in the [place] of Nutrition. 8. Weighing Yourself Really Works The scale provides the best evidence about whether your efforts are paying off. Seeing the numbers tick up or down or stagnate is motivation to keep going-or to rethink your approach. A 2015 study at [facility] found that daily weigh-ins helped people lose more weight, keep it off, and maintain that loss, even after two years. Use it to lose it. Step on the scale at the same time every day for the best results. If your weight shoots up several pounds from one weigh-in to the next, don't freak out. Eating a lot of salt the night before or having your period is the likely culprit. The number should return to normal in a day or two. It's a steady climb that you need to do something about. 9. Too Much Stress and Too Little Sleep Are Your Enemies When you're tired and frazzled, your body cranks up the production of cortisol, the stress hormone that can cause carb cravings. Not getting enough sleep also boosts your levels of ghrelin, a hormone associated with hunger, while suppressing leptin, a hormone that signals fullness and satiety. People on a diet who slept only five and a half hours a night for two weeks lost 55 percent less fat and were hungrier than those who slept eight  and a half hours, according to a study in the [place]. Use it to lose it. Prioritize sleep, aiming for seven hours or more a night, which research shows helps lower stress. And make sure you're getting quality zzz's. If a snoring spouse or a fidgety cat wakes you up frequently throughout the night, you may end up getting the equivalent of just four hours of sleep, according to a study from [facility]. Keep pets out of the bedroom, and use a white-noise app to drown out snoring. 10. You Will Hit a plateau-And You Can Bust Through It As you slim down, your body releases much less leptin, the fullness hormone.  If you're not strength training, start right now. Building muscle can raise your metabolism to help you overcome a plateau. To keep your body challenged and burning calories, incorporate new moves and more intense intervals into your workouts or add another sweat session to your weekly routine. Alternatively, cut an extra 100 calories or so a day from your diet. Now that you've lost weight, your body simply doesn't need as much fuel.      Since food equals calories, in order to lose weight you must either eat fewer calories, exercise more to burn off calories with activity, or both. Food  that is not used to fuel the body is stored as fat. A major component of losing weight is to make smarter food choices. Here's how:  1)   Limit non-nutritious foods, such as: Sugar, honey, syrups and candy Pastries, donuts, pies, cakes and cookies Soft drinks, sweetened juices and alcoholic beverages  2)  Cut down on high-fat foods by: - Choosing poultry, fish or lean red meat - Choosing low-fat cooking methods, such as baking, broiling, steaming, grilling and boiling - Using low-fat or non-fat dairy products - Using vinaigrette, herbs, lemon or fat-free salad dressings - Avoiding fatty meats, such as bacon, sausage, franks, ribs and luncheon meats - Avoiding high-fat  snacks like nuts, chips and chocolate - Avoiding fried foods - Using less butter, margarine, oil and mayonnaise - Avoiding high-fat gravies, cream sauces and cream-based soups  3) Eat a variety of foods, including: - Fruit and vegetables that are raw, steamed or baked - Whole grains, breads, cereal, rice and pasta - Dairy products, such as low-fat or non-fat milk or yogurt, low-fat cottage cheese and low-fat cheese - Protein-rich foods like chicken, [place], fish, lean meat and legumes, or beans  4) Change your eating habits by: - Eat three balanced meals a day to help control your hunger - Watch portion sizes and eat small servings of a variety of foods - Choose low-calorie snacks - Eat only when you are hungry and stop when you are satisfied - Eat slowly and try not to perform other tasks while eating - Find other activities to distract you from food, such as walking, taking up a hobby or being involved in the community - Include regular exercise in your daily routine ( minimum of 20 min of moderate-intensity exercise at least 5 days/week)  - Find a support group, if necessary, for emotional support in your weight loss journey      Easy ways to cut 100 calories  1. Eat your eggs with hot sauce OR salsa instead of cheese.  Eggs are great for breakfast, but many people consider eggs and cheese to be BFFs. Instead of cheese-1 oz. of cheddar has 114 calories-top your eggs with hot sauce, which contains no calories and helps with satiety and metabolism. Salsa is also a great option!!  2. Top your toast, waffles or pancakes with fresh berries instead of jelly or syrup. Half a cup of berries-fresh, frozen or thawed-has about 40 calories, compared with 2 tbsp. of maple syrup or jelly, which both have about 100 calories. The berries will also give you a good punch of fiber, which helps keep you full and satisfied and won't spike blood sugar quickly like the jelly or syrup. 3. Swap the non-fat  latte for black coffee with a splash of half-and-half. Contrary to its name, that non-fat latte has 130 calories and a startling 19g of carbohydrates per 16 oz. serving. Replacing that 'light' drinkable dessert with a black coffee with a splash of half-and-half saves you more than 100 calories per 16 oz. serving. 4. Sprinkle salads with freeze-dried raspberries instead of dried cranberries. If you want a sweet addition to your nutritious salad, stay away from dried cranberries. They have a whopping 130 calories per  cup and 30g carbohydrates. Instead, sprinkle freeze-dried raspberries guilt-free and save more than 100 calories per  cup serving, adding 3g of belly-filling fiber. 5. Go for mustard in place of mayo on your sandwich. Mustard can add really nice flavor to any sandwich, and there are tons of varieties,  from spicy to honey. A serving of mayo is 95 calories, versus 10 calories in a serving of mustard.  Or try an avocado mayo spread: You can find the recipe few click this link: https://www.californiaavocado.com/recipes/recipe-container/[place]-avocado-mayo 6. Choose a DIY salad dressing instead of the store-bought kind. Mix Dijon or whole grain mustard with low-fat Kefir or red wine vinegar and garlic. 7. Use hummus as a spread instead of a dip. Use hummus as a spread on a high-fiber cracker or tortilla with a sandwich and save on calories without sacrificing taste. 8. Pick just one salad "accessory." Salad isn't automatically a calorie winner. It's easy to over-accessorize with toppings. Instead of topping your salad with nuts, avocado and cranberries (all three will clock in at 313 calories), just pick one. The next day, choose a different accessory, which will also keep your salad interesting. You don't wear all your jewelry every day, right? 9. Ditch the white pasta in favor of spaghetti squash. One cup of cooked spaghetti squash has about 40 calories, compared with traditional  spaghetti, which comes with more than 200. Spaghetti squash is also nutrient-dense. It's a good source of fiber and Vitamins A and C, and it can be eaten just like you would eat pasta-with a great tomato sauce and [place] meatballs or with pesto, tofu and spinach, for example. 10. Dress up your chili, soups and stews with non-fat [place] yogurt instead of sour cream. Just a 'dollop' of sour cream can set you back 115 calories and a whopping 12g of fat-seven of which are of the artery-clogging variety. Added bonus: [place] yogurt is packed with muscle-building protein, calcium and B Vitamins. 11. Mash cauliflower instead of mashed potatoes. One cup of traditional mashed potatoes-in all their creamy goodness-has more than 200 calories, compared to mashed cauliflower, which you can typically eat for less than 100 calories per 1 cup serving. Cauliflower is a great source of the antioxidant indole-3-carbinol (I3C), which may help reduce the risk of some cancers, like breast cancer. 12. Ditch the ice cream sundae in favor of a [place] yogurt parfait. Instead of a cup of ice cream or fro-yo for dessert, try 1 cup of nonfat Greek yogurt topped with fresh berries and a sprinkle of cacao nibs. Both toppings are packed with antioxidants, which can help reduce cellular inflammation and oxidative damage. And the comparison is a no-brainer: One cup of ice cream has about 275 calories; one cup of frozen yogurt has about 230; and a cup of Greek yogurt has just 130, plus twice the protein, so you're less likely to return to the freezer for a second helping. 13. Put olive oil in a spray container instead of using it directly from the bottle. Each tablespoon of olive oil is 120 calories and 15g of fat. Use a mister instead of pouring it straight into the pan or onto a salad. This allows for portion control and will save you more than 100 calories. 14. When baking, substitute canned pumpkin for butter or oil. Canned pumpkin-not  pumpkin pie mix-is loaded with Vitamin A, which is important for skin and eye health, as well as immunity. And the comparisons are pretty crazy:  cup of canned pumpkin has about 40 calories, compared to butter or oil, which has more than 800 calories. Yes, 800 calories. Applesauce and mashed banana can also serve as good substitutions for butter or oil, usually in a 1:1 ratio. 15. Top casseroles with high-fiber cereal instead of breadcrumbs. Breadcrumbs are typically made with white bread,  while breakfast cereals contain 5-9g of fiber per serving. Not only will you save more than 150 calories per  cup serving, the swap will also keep you more full and you'll get a metabolism boost from the added fiber. 16. Snack on pistachios instead of macadamia nuts. Believe it or not, you get the same amount of calories from 35 pistachios (100 calories) as you would from only five macadamia nuts. 17. Chow down on kale chips rather than potato chips. This is my favorite 'don't knock it 'till you try it' swap. Kale chips are so easy to make at home, and you can spice them up with a little grated parmesan or chili powder. Plus, they're a mere fraction of the calories of potato chips, but with the same crunch factor we crave so often. 18. Add seltzer and some fruit slices to your cocktail instead of soda or fruit juice. One cup of soda or fruit juice can pack on as much as 140 calories. Instead, use seltzer and fruit slices. The fruit provides valuable phytochemicals, such as flavonoids and anthocyanins, which help to combat cancer and stave off the aging process.

## 2016-08-23 NOTE — Assessment & Plan Note (Signed)
Eat foods high in mono unsaturated and polyunsaturated fats.  Try to move more.

## 2016-09-02 DIAGNOSIS — Z8669 Personal history of other diseases of the nervous system and sense organs: Secondary | ICD-10-CM | POA: Insufficient documentation

## 2016-09-02 DIAGNOSIS — M359 Systemic involvement of connective tissue, unspecified: Secondary | ICD-10-CM | POA: Insufficient documentation

## 2016-09-02 DIAGNOSIS — Z8719 Personal history of other diseases of the digestive system: Secondary | ICD-10-CM | POA: Insufficient documentation

## 2016-09-02 DIAGNOSIS — Z79899 Other long term (current) drug therapy: Secondary | ICD-10-CM | POA: Insufficient documentation

## 2016-09-02 DIAGNOSIS — Z8679 Personal history of other diseases of the circulatory system: Secondary | ICD-10-CM | POA: Insufficient documentation

## 2016-09-02 NOTE — Progress Notes (Deleted)
Office Visit Note  Patient: [name]             Date of Birth: Dec 19, 1950           MRN: [id number]             PCP: Mellody Dance, DO Referring: Mellody Dance, DO Visit Date: 09/04/2016 Occupation: _0 @    Subjective:  No chief complaint on file.   History of Present Illness: [name] is a 66 y.o. female ***   Activities of Daily Living:  Patient reports morning stiffness for *** {minute/hour:19697}.   Patient {ACTIONS;DENIES/REPORTS:21021675::"Denies"} nocturnal pain.  Difficulty dressing/grooming: {ACTIONS;DENIES/REPORTS:21021675::"Denies"} Difficulty climbing stairs: {ACTIONS;DENIES/REPORTS:21021675::"Denies"} Difficulty getting out of chair: {ACTIONS;DENIES/REPORTS:21021675::"Denies"} Difficulty using hands for taps, buttons, cutlery, and/or writing: {ACTIONS;DENIES/REPORTS:21021675::"Denies"}   No Rheumatology ROS completed.   PMFS History:  Patient Active Problem List   Diagnosis Date Noted  . ANA positive 09/02/2016  . High risk medication use 09/02/2016  . History of esophagitis 09/02/2016  . Elevated triglycerides with high cholesterol 08/23/2016  . Low serum HDL 08/23/2016  . Breast cyst, left 06/14/2016  . Encounter for wellness examination 05/23/2016  . Abnormality of gait 05/09/2016  . Hypokalemia 03/22/2016  . Sjoegren syndrome ([place]) 02/29/2016  . On potassium wasting diuretic therapy 02/29/2016  . GERD (gastroesophageal reflux disease) 01/19/2016  . Glucose intolerance (impaired glucose tolerance) 01/19/2016  . Chronic diastolic CHF (congestive heart failure) ([place]) 04/20/2015  . Peripheral polyneuropathy ([place]) 02/02/2014  . Morbid obesity ([place]) 11/17/2013  . Heart murmur   . OSA (obstructive sleep apnea)   . Rheumatoid arthritis ([place])   . Hiatal hernia   . Fibromyalgia   . Peripheral autonomic neuropathy of unknown cause   . PVC's (premature ventricular contractions)   . History of total knee arthroplasty, right  06/24/2011  . Osteoarthritis of right knee 06/22/2011    Past Medical History:  Diagnosis Date  . Arthritis    Rheumatoid arthritis,   . Blood transfusion    1981  . Chronic diastolic CHF (congestive heart failure) ([place])   . Fibromyalgia   . Heart murmur    as a child  . Hiatal hernia    sjorgens syndrome  . OSA (obstructive sleep apnea)   . Peripheral autonomic neuropathy of unknown cause     Family History  Problem Relation Age of Onset  . Alzheimer's disease Mother   . Heart attack Mother   . Hypertension Mother   . Glaucoma Mother   . Cancer Father   . Heart attack Brother   . Heart disease Brother   . Glaucoma Brother   . Hyperlipidemia Brother   . Glaucoma Brother    Past Surgical History:  Procedure Laterality Date  . ABDOMINAL HYSTERECTOMY     BTL, BSO  . CHOLECYSTECTOMY    . DIAGNOSTIC LAPAROSCOPY     x3  . KNEE ARTHROSCOPY     x2  . patotid cystectomy    . TENDON REPAIR  1980   left ankle and tibia  . TONSILLECTOMY    . TOTAL KNEE ARTHROPLASTY  06/21/2011   Procedure: TOTAL KNEE ARTHROPLASTY;  Surgeon: Gearlean Alf;  Location: [facility];  Service: Orthopedics;  Laterality: Right;  . TUBAL LIGATION     Social History   Social History Narrative   Lives at home alone   Right-handed   Drinks 1 or less cups of coffee and 2 servings of either tea or soda per day     Objective: Vital Signs:  There were no vitals taken for this visit.   Physical Exam   Musculoskeletal Exam: ***  CDAI Exam: No CDAI exam completed.    Investigation: Findings:  04/12/2016 RAPID 3 shows a raw score of 21.3 and index of 7 consistent with high severity. 08/11/2016 CBC normal, CMP normal Records review:  In January of 2011, her ANA was negative.  In October of 2009, her ANA and ENA were negative.  She has had positive ANA prior to her coming to our office which was several years ago.  In 2005, her rheumatoid factor was positive at 29.    0711/2015 bilateral hand films  today which showed no MCP intercarpal joint space changes and no erosive changes.  She has DIP and PIP narrowing. X-rays of bilateral ankle joints showed bilateral calcaneal spurs and ankle joint spurring without any joint space narrowing or erosive changes.   Lab on 08/11/2016  Component Date Value Ref Range Status  . WBC 08/11/2016 4.4  3.8 - 10.8 K/uL Final  . RBC 08/11/2016 4.65  3.80 - 5.10 MIL/uL Final  . Hemoglobin 08/11/2016 13.2  11.7 - 15.5 g/dL Final  . HCT 08/11/2016 41.1  35.0 - 45.0 % Final  . MCV 08/11/2016 88.4  80.0 - 100.0 fL Final  . MCH 08/11/2016 28.4  27.0 - 33.0 pg Final  . MCHC 08/11/2016 32.1  32.0 - 36.0 g/dL Final  . RDW 08/11/2016 13.6  11.0 - 15.0 % Final  . Platelets 08/11/2016 196  140 - 400 K/uL Final  . MPV 08/11/2016 11.0  7.5 - 12.5 fL Final  . Neutro Abs 08/11/2016 2552  1,500 - 7,800 cells/uL Final  . Lymphs Abs 08/11/2016 1232  850 - 3,900 cells/uL Final  . Monocytes Absolute 08/11/2016 484  200 - 950 cells/uL Final  . Eosinophils Absolute 08/11/2016 88  15 - 500 cells/uL Final  . Basophils Absolute 08/11/2016 44  0 - 200 cells/uL Final  . Neutrophils Relative % 08/11/2016 58  % Final  . Lymphocytes Relative 08/11/2016 28  % Final  . Monocytes Relative 08/11/2016 11  % Final  . Eosinophils Relative 08/11/2016 2  % Final  . Basophils Relative 08/11/2016 1  % Final  . Smear Review 08/11/2016 Criteria for review not met   Final  . Sodium 08/11/2016 142  135 - 146 mmol/L Final  . Potassium 08/11/2016 4.3  3.5 - 5.3 mmol/L Final  . Chloride 08/11/2016 108  98 - 110 mmol/L Final  . CO2 08/11/2016 26  20 - 31 mmol/L Final  . Glucose, Bld 08/11/2016 81  65 - 99 mg/dL Final  . BUN 08/11/2016 8  7 - 25 mg/dL Final  . Creat 08/11/2016 0.56  0.50 - 0.99 mg/dL Final   Comment:   For patients > or = 65 years of age: The upper reference limit for Creatinine is approximately 13% higher for people identified as African-American.     . Total Bilirubin  08/11/2016 0.3  0.2 - 1.2 mg/dL Final  . Alkaline Phosphatase 08/11/2016 83  33 - 130 U/L Final  . AST 08/11/2016 16  10 - 35 U/L Final  . ALT 08/11/2016 11  6 - 29 U/L Final  . Total Protein 08/11/2016 6.2  6.1 - 8.1 g/dL Final  . Albumin 08/11/2016 3.9  3.6 - 5.1 g/dL Final  . Calcium 08/11/2016 9.1  8.6 - 10.4 mg/dL Final  . GFR, Est African American 08/11/2016 >89  >=60 mL/min Final  . GFR, Est Non  African American 08/11/2016 >89  >=60 mL/min Final  . Cholesterol 08/11/2016 201* <200 mg/dL Final  . Triglycerides 08/11/2016 293* <150 mg/dL Final  . HDL 08/11/2016 43* >50 mg/dL Final  . Total CHOL/HDL Ratio 08/11/2016 4.7  <5.0 Ratio Final  . VLDL 08/11/2016 59* <30 mg/dL Final  . LDL Cholesterol 08/11/2016 99  <100 mg/dL Final  Office Visit on 07/31/2016  Component Date Value Ref Range Status  . Sodium 07/31/2016 142  135 - 146 mmol/L Final  . Potassium 07/31/2016 4.1  3.5 - 5.3 mmol/L Final  . Chloride 07/31/2016 106  98 - 110 mmol/L Final  . CO2 07/31/2016 27  20 - 31 mmol/L Final  . Glucose, Bld 07/31/2016 102* 65 - 99 mg/dL Final  . BUN 07/31/2016 11  7 - 25 mg/dL Final  . Creat 07/31/2016 0.62  0.50 - 0.99 mg/dL Final   Comment:   For patients > or = 65 years of age: The upper reference limit for Creatinine is approximately 13% higher for people identified as African-American.     . Calcium 07/31/2016 8.9  8.6 - 10.4 mg/dL Final  Orders Only on 07/25/2016  Component Date Value Ref Range Status  . Card #1 Date 07/25/2016 07/19/16   Final  . Fecal Occult Blood, POC 07/25/2016 Negative  Negative Final  . Card #2 Date 07/25/2016 07/20/16   Final  . Card #2 Fecal Occult Blod, POC 07/25/2016 Negative   Final  . Card #3 Date 07/25/2016 07/21/16   Final  . Card #3 Fecal Occult Blood, POC 07/25/2016 Negative   Final  Abstract on 07/10/2016  Component Date Value Ref Range Status  . HM Diabetic Eye Exam 07/03/2016 No Retinopathy  No Retinopathy Final     Imaging: No results  found.  Speciality Comments: No specialty comments available.    Procedures:  No procedures performed Allergies: Cymbalta [duloxetine hcl]; Demerol; Ivp dye [iodinated diagnostic agents]; Morphine and related; and Adhesive [tape]   Assessment / Plan:     Visit Diagnoses: Rheumatoid arthritis involving multiple sites with positive rheumatoid factor (HCC)  ANA positive  High risk medication use - Arava PLQ and SSZ   History of esophagitis - Dr Collene Mares  History of total knee arthroplasty, right    Orders: No orders of the defined types were placed in this encounter.  No orders of the defined types were placed in this encounter.   Face-to-face time spent with patient was *** minutes. 50% of time was spent in counseling and coordination of care.  Follow-Up Instructions: No Follow-up on file.   Amy Littrell, RT  Note - This record has been created using [employer].  Chart creation errors have been sought, but may not always  have been located. Such creation errors do not reflect on  the standard of medical care.

## 2016-09-03 DIAGNOSIS — M810 Age-related osteoporosis without current pathological fracture: Secondary | ICD-10-CM | POA: Insufficient documentation

## 2016-09-03 DIAGNOSIS — Z8719 Personal history of other diseases of the digestive system: Secondary | ICD-10-CM | POA: Insufficient documentation

## 2016-09-04 ENCOUNTER — Ambulatory Visit: Payer: PPO | Admitting: Rheumatology

## 2016-09-09 DIAGNOSIS — M47816 Spondylosis without myelopathy or radiculopathy, lumbar region: Secondary | ICD-10-CM | POA: Insufficient documentation

## 2016-09-09 DIAGNOSIS — Z8639 Personal history of other endocrine, nutritional and metabolic disease: Secondary | ICD-10-CM | POA: Insufficient documentation

## 2016-09-09 NOTE — Progress Notes (Deleted)
Office Visit Note  Patient: [name]             Date of Birth: 1951-03-16           MRN: [id number]             PCP: Mellody Dance, DO Referring: Mellody Dance, DO Visit Date: 09/12/2016 Occupation: @GUAROCC @    Subjective:  No chief complaint on file.   History of Present Illness: [name] is a 66 y.o. female ***   Activities of Daily Living:  Patient reports morning stiffness for *** {minute/hour:19697}.   Patient {ACTIONS;DENIES/REPORTS:21021675::"Denies"} nocturnal pain.  Difficulty dressing/grooming: {ACTIONS;DENIES/REPORTS:21021675::"Denies"} Difficulty climbing stairs: {ACTIONS;DENIES/REPORTS:21021675::"Denies"} Difficulty getting out of chair: {ACTIONS;DENIES/REPORTS:21021675::"Denies"} Difficulty using hands for taps, buttons, cutlery, and/or writing: {ACTIONS;DENIES/REPORTS:21021675::"Denies"}   No Rheumatology ROS completed.   PMFS History:  Patient Active Problem List   Diagnosis Date Noted  . 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 History of diabetes mellitus 09/09/2016  . History of diverticulitis 09/03/2016  . Osteoporosis 09/03/2016  . ANA positive 09/02/2016  . High risk medication use 09/02/2016  . History of esophagitis 09/02/2016  . Elevated triglycerides with high cholesterol 08/23/2016  . Low serum HDL 08/23/2016  . Breast cyst, left 06/14/2016  . Encounter for wellness examination 05/23/2016  . Abnormality of gait 05/09/2016  . Hypokalemia 03/22/2016  . Sjoegren syndrome ([place]) 02/29/2016  . On potassium wasting diuretic therapy 02/29/2016  . GERD (gastroesophageal reflux disease) 01/19/2016  . Glucose intolerance (impaired glucose tolerance) 01/19/2016  . Chronic diastolic CHF (congestive heart failure) ([place]) 04/20/2015  . Peripheral polyneuropathy ([place]) 02/02/2014  . Morbid obesity ([place]) 11/17/2013  . Heart murmur   . OSA (obstructive sleep apnea)   . Rheumatoid arthritis ([place])   . Hiatal hernia   . Fibromyalgia    . PVC's (premature ventricular contractions)   . 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 Osteoarthritis of right knee 06/22/2011    Past Medical History:  Diagnosis Date  . Arthritis    Rheumatoid arthritis,   . Blood transfusion    1981  . Chronic diastolic CHF (congestive heart failure) ([place])   . Fibromyalgia   . Heart murmur    as a child  . Hiatal hernia    sjorgens syndrome  . OSA (obstructive sleep apnea)   . Peripheral autonomic neuropathy of unknown cause     Family History  Problem Relation Age of Onset  . Alzheimer's disease Mother   . Heart attack Mother   . Hypertension Mother   . Glaucoma Mother   . Cancer Father   . Heart attack Brother   . Heart disease Brother   . Glaucoma Brother   . Hyperlipidemia Brother   . Glaucoma Brother    Past Surgical History:  Procedure Laterality Date  . ABDOMINAL HYSTERECTOMY     BTL, BSO  . CHOLECYSTECTOMY    . DIAGNOSTIC LAPAROSCOPY     x3  . KNEE ARTHROSCOPY     x2  . patotid cystectomy    . TENDON REPAIR  1980   left ankle and tibia  . TONSILLECTOMY    . TOTAL KNEE ARTHROPLASTY  06/21/2011   Procedure: TOTAL KNEE ARTHROPLASTY;  Surgeon: Gearlean Alf;  Location: [facility];  Service: Orthopedics;  Laterality: Right;  . TUBAL LIGATION     Social History   Social History Narrative   Lives at home alone   Right-handed   Drinks 1 or less cups of coffee and 2  servings of either tea or soda per day     Objective: Vital Signs: There were no vitals taken for this visit.   Physical Exam   Musculoskeletal Exam: ***  CDAI Exam: No CDAI exam completed.    Investigation: Findings:  Methotrexate caused hair loss, Plaquenil and Orencia discontinued due to cost, October 2015 TB Gold negative 08/11/2016 CBC normal, CMP normal    Imaging: No results found.  Speciality Comments: No specialty comments available.    Procedures:  No procedures performed Allergies: Cymbalta [duloxetine hcl];  Demerol; Ivp dye [iodinated diagnostic agents]; Morphine and related; and Adhesive [tape]   Assessment / Plan:     Visit Diagnoses: Rheumatoid arthritis involving multiple sites with positive rheumatoid factor (HCC)  Sjogren's syndrome with keratoconjunctivitis sicca (HCC) - Positive ANA, parotitis, Raynauds  High risk medication use - Arava 20 mg by mouth daily, sulfasalazine 500 mg 2 tablets twice a day (Orencia IV discontinued due to cost)  Fibromyalgia  History of total knee replacement, right - November 2012 Dr. Maureen Ralphs  DDD lumbar spine - Severe scoliosis  Osteoporosis - Status post [facility] January 2016 T score -2.7  OSA (obstructive sleep apnea)  Morbid obesity (HCC)  Chronic diastolic CHF (congestive heart failure) (HCC)  Gastroesophageal reflux disease with esophagitis  Elevated triglycerides with high cholesterol  History of eosinophilic esophagitis  Peripheral polyneuropathy ([place])  History of diabetes mellitus    Orders: No orders of the defined types were placed in this encounter.  No orders of the defined types were placed in this encounter.   Face-to-face time spent with patient was *** minutes. 50% of time was spent in counseling and coordination of care.  Follow-Up Instructions: No Follow-up on file.   Bo Merino, MD  Note - This record has been created using [employer].  Chart creation errors have been sought, but may not always  have been located. Such creation errors do not reflect on  the standard of medical care.

## 2016-09-11 ENCOUNTER — Other Ambulatory Visit: Payer: Self-pay | Admitting: Rheumatology

## 2016-09-11 NOTE — Telephone Encounter (Signed)
Last Visit: 04/12/16 Next Visit: 09/12/16 Labs: 08/11/16 WNL PLQ Eye Exam: 06/20/16 WNL  Okay to refill PLQ and SSZ?

## 2016-09-12 ENCOUNTER — Ambulatory Visit: Payer: PPO | Admitting: Rheumatology

## 2016-09-18 DIAGNOSIS — G4733 Obstructive sleep apnea (adult) (pediatric): Secondary | ICD-10-CM | POA: Diagnosis not present

## 2016-09-18 DIAGNOSIS — R269 Unspecified abnormalities of gait and mobility: Secondary | ICD-10-CM | POA: Diagnosis not present

## 2016-10-04 DIAGNOSIS — Z8639 Personal history of other endocrine, nutritional and metabolic disease: Secondary | ICD-10-CM | POA: Insufficient documentation

## 2016-10-04 NOTE — Progress Notes (Signed)
Office Visit Note  Patient: [name]             Date of Birth: 1951-05-02           MRN: [id number]             PCP: Mellody Dance, DO Referring: Mellody Dance, DO Visit Date: 10/13/2016 Occupation: '@GUAROCC'$ @    Subjective: Pain hands   History of Present Illness: [name] is a 66 y.o. female with history of rheumatoid arthritis. According to her she is doing fairly well with some discomfort in her hands. He gets intermittent swelling in her hands. She continues to have sicca symptoms. She is tolerating combination therapy of's Arava, sulfasalazine and Plaquenil . She had her eye exam in December 2017 which was normal. She's off and on discomfort in her knee joints. Lower back is doing better. Her fibromyalgia is well controlled with muscle relaxers.  Activities of Daily Living:  Patient reports morning stiffness for minutes.   Patient Denies nocturnal pain.  Difficulty dressing/grooming: Denies Difficulty climbing stairs: Reports Difficulty getting out of chair: Reports Difficulty using hands for taps, buttons, cutlery, and/or writing: Denies   Review of Systems  Constitutional: Positive for fatigue. Negative for night sweats, weight gain, weight loss and weakness.  HENT: Positive for mouth dryness. Negative for mouth sores, trouble swallowing, trouble swallowing and nose dryness.   Eyes: Positive for dryness. Negative for pain, redness and visual disturbance.  Respiratory: Negative for cough, shortness of breath and difficulty breathing.   Cardiovascular: Positive for palpitations. Negative for chest pain, hypertension, irregular heartbeat and swelling in legs/feet.       History of PVCs  Gastrointestinal: Negative for blood in stool, constipation and diarrhea.  Endocrine: Negative for increased urination.  Genitourinary: Negative for vaginal dryness.  Musculoskeletal: Positive for arthralgias, joint pain, myalgias, morning stiffness and myalgias. Negative  for joint swelling, muscle weakness and muscle tenderness.  Skin: Negative for color change, rash, hair loss, skin tightness, ulcers and sensitivity to sunlight.  Allergic/Immunologic: Negative for susceptible to infections.  Neurological: Negative for dizziness, memory loss and night sweats.  Hematological: Negative for swollen glands.  Psychiatric/Behavioral: Negative for depressed mood and sleep disturbance. The patient is not nervous/anxious.     PMFS History:  Patient Active Problem List   Diagnosis Date Noted  . Eosinophilic esophagitis 81/08/7508  . History of hyperlipidemia 10/04/2016  . 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 History of diabetes mellitus 09/09/2016  . History of diverticulitis 09/03/2016  . Osteoporosis 09/03/2016  . ANA positive 09/02/2016  . High risk medication use 09/02/2016  . History of esophagitis 09/02/2016  . Elevated triglycerides with high cholesterol 08/23/2016  . Low serum HDL 08/23/2016  . Breast cyst, left 06/14/2016  . Encounter for wellness examination 05/23/2016  . Abnormality of gait 05/09/2016  . Hypokalemia 03/22/2016  . Sjoegren syndrome ([place]) 02/29/2016  . On potassium wasting diuretic therapy 02/29/2016  . GERD (gastroesophageal reflux disease) 01/19/2016  . Glucose intolerance (impaired glucose tolerance) 01/19/2016  . Chronic diastolic CHF (congestive heart failure) ([place]) 04/20/2015  . Peripheral polyneuropathy ([place]) 02/02/2014  . Morbid obesity ([place]) 11/17/2013  . Heart murmur   . OSA (obstructive sleep apnea)   . Rheumatoid arthritis ([place])   . Hiatal hernia   . Fibromyalgia   . PVC's (premature ventricular contractions)   . 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 Osteoarthritis of right knee 06/22/2011    Past Medical History:  Diagnosis Date  .  Arthritis    Rheumatoid arthritis,   . Blood transfusion    1981  . Chronic diastolic CHF (congestive heart failure) ([place])   . Fibromyalgia   . Heart  murmur    as a child  . Hiatal hernia    sjorgens syndrome  . OSA (obstructive sleep apnea)   . Peripheral autonomic neuropathy of unknown cause     Family History  Problem Relation Age of Onset  . Alzheimer's disease Mother   . Heart attack Mother   . Hypertension Mother   . Glaucoma Mother   . Cancer Father   . Heart attack Brother   . Heart disease Brother   . Glaucoma Brother   . Hyperlipidemia Brother   . Glaucoma Brother    Past Surgical History:  Procedure Laterality Date  . ABDOMINAL HYSTERECTOMY     BTL, BSO  . CHOLECYSTECTOMY    . DIAGNOSTIC LAPAROSCOPY     x3  . KNEE ARTHROSCOPY     x2  . patotid cystectomy    . TENDON REPAIR  1980   left ankle and tibia  . TONSILLECTOMY    . TOTAL KNEE ARTHROPLASTY  06/21/2011   Procedure: TOTAL KNEE ARTHROPLASTY;  Surgeon: Gearlean Alf;  Location: [facility];  Service: Orthopedics;  Laterality: Right;  . TUBAL LIGATION     Social History   Social History Narrative   Lives at home alone   Right-handed   Drinks 1 or less cups of coffee and 2 servings of either tea or soda per day     Objective: Vital Signs: BP 124/68   Pulse 78   Resp 14   Ht 5' 1.5" (1.562 m)   Wt 222 lb (100.7 kg)   BMI 41.27 kg/m    Physical Exam  Constitutional: She is oriented to person, place, and time. She appears well-developed and well-nourished.  HENT:  Head: Normocephalic and atraumatic.  Eyes: Conjunctivae and EOM are normal.  Neck: Normal range of motion.  Cardiovascular: Normal rate, regular rhythm, normal heart sounds and intact distal pulses.   Pulmonary/Chest: Effort normal and breath sounds normal.  Abdominal: Soft. Bowel sounds are normal.  Lymphadenopathy:    She has no cervical adenopathy.  Neurological: She is alert and oriented to person, place, and time.  Skin: Skin is warm and dry. Capillary refill takes less than 2 seconds.  Psychiatric: She has a normal mood and affect. Her behavior is normal.  Nursing note and  vitals reviewed.    Musculoskeletal Exam: C-spine, thoracic, lumbar spine limited range of motion she has thoracic kyphosis. Shoulder joints elbow joints are good range of motion. She is tenderness over her wrist joints and MCP joints. She is synovial thickening over bilateral MCP joints. As described below. Hip joints is limited range of motion area and her right total knee replacement is doing well. Left knee joint has some warmth on palpation. Ankle joints are good range of motion. She is some MTP thickening.  CDAI Exam: CDAI Homunculus Exam:   Tenderness:  RUE: wrist LUE: wrist Right hand: 2nd MCP Left hand: 2nd MCP and 3rd MCP LLE: tibiofemoral  Swelling:  Left hand: 2nd MCP  Joint Counts:  CDAI Tender Joint count: 6 CDAI Swollen Joint count: 1  Global Assessments:  Patient Global Assessment: 5 Provider Global Assessment: 5  CDAI Calculated Score: 17    Investigation: Findings:  02/25/2010 We obtained bilateral hand films today which showed no MCP intercarpal joint space changes and no erosive  changes.  She has DIP and PIP narrowing. X-rays of bilateral ankle joints showed bilateral calcaneal spurs and ankle joint spurring without any joint space narrowing or erosive changes.  Rheumatoid arthritis.  Positive rheumatoid factor and positive ANA  06/20/16 eye exam negative for plaquenil toxicity. 08/11/2016 CBC normal, CMP normal  Lab on 08/11/2016  Component Date Value Ref Range Status  . WBC 08/11/2016 4.4  3.8 - 10.8 K/uL Final  . RBC 08/11/2016 4.65  3.80 - 5.10 MIL/uL Final  . Hemoglobin 08/11/2016 13.2  11.7 - 15.5 g/dL Final  . HCT 08/11/2016 41.1  35.0 - 45.0 % Final  . MCV 08/11/2016 88.4  80.0 - 100.0 fL Final  . MCH 08/11/2016 28.4  27.0 - 33.0 pg Final  . MCHC 08/11/2016 32.1  32.0 - 36.0 g/dL Final  . RDW 08/11/2016 13.6  11.0 - 15.0 % Final  . Platelets 08/11/2016 196  140 - 400 K/uL Final  . MPV 08/11/2016 11.0  7.5 - 12.5 fL Final  . Neutro Abs  08/11/2016 2552  1,500 - 7,800 cells/uL Final  . Lymphs Abs 08/11/2016 1232  850 - 3,900 cells/uL Final  . Monocytes Absolute 08/11/2016 484  200 - 950 cells/uL Final  . Eosinophils Absolute 08/11/2016 88  15 - 500 cells/uL Final  . Basophils Absolute 08/11/2016 44  0 - 200 cells/uL Final  . Neutrophils Relative % 08/11/2016 58  % Final  . Lymphocytes Relative 08/11/2016 28  % Final  . Monocytes Relative 08/11/2016 11  % Final  . Eosinophils Relative 08/11/2016 2  % Final  . Basophils Relative 08/11/2016 1  % Final  . Smear Review 08/11/2016 Criteria for review not met   Final  . Sodium 08/11/2016 142  135 - 146 mmol/L Final  . Potassium 08/11/2016 4.3  3.5 - 5.3 mmol/L Final  . Chloride 08/11/2016 108  98 - 110 mmol/L Final  . CO2 08/11/2016 26  20 - 31 mmol/L Final  . Glucose, Bld 08/11/2016 81  65 - 99 mg/dL Final  . BUN 08/11/2016 8  7 - 25 mg/dL Final  . Creat 08/11/2016 0.56  0.50 - 0.99 mg/dL Final   Comment:   For patients > or = 65 years of age: The upper reference limit for Creatinine is approximately 13% higher for people identified as African-American.     . Total Bilirubin 08/11/2016 0.3  0.2 - 1.2 mg/dL Final  . Alkaline Phosphatase 08/11/2016 83  33 - 130 U/L Final  . AST 08/11/2016 16  10 - 35 U/L Final  . ALT 08/11/2016 11  6 - 29 U/L Final  . Total Protein 08/11/2016 6.2  6.1 - 8.1 g/dL Final  . Albumin 08/11/2016 3.9  3.6 - 5.1 g/dL Final  . Calcium 08/11/2016 9.1  8.6 - 10.4 mg/dL Final  . GFR, Est African American 08/11/2016 >89  >=60 mL/min Final  . GFR, Est Non African American 08/11/2016 >89  >=60 mL/min Final  . Cholesterol 08/11/2016 201* <200 mg/dL Final  . Triglycerides 08/11/2016 293* <150 mg/dL Final  . HDL 08/11/2016 43* >50 mg/dL Final  . Total CHOL/HDL Ratio 08/11/2016 4.7  <5.0 Ratio Final  . VLDL 08/11/2016 59* <30 mg/dL Final  . LDL Cholesterol 08/11/2016 99  <100 mg/dL Final  Office Visit on 07/31/2016  Component Date Value Ref Range Status   . Sodium 07/31/2016 142  135 - 146 mmol/L Final  . Potassium 07/31/2016 4.1  3.5 - 5.3 mmol/L Final  . Chloride 07/31/2016  106  98 - 110 mmol/L Final  . CO2 07/31/2016 27  20 - 31 mmol/L Final  . Glucose, Bld 07/31/2016 102* 65 - 99 mg/dL Final  . BUN 07/31/2016 11  7 - 25 mg/dL Final  . Creat 07/31/2016 0.62  0.50 - 0.99 mg/dL Final   Comment:   For patients > or = 65 years of age: The upper reference limit for Creatinine is approximately 13% higher for people identified as African-American.     . Calcium 07/31/2016 8.9  8.6 - 10.4 mg/dL Final  Orders Only on 07/25/2016  Component Date Value Ref Range Status  . Card #1 Date 07/25/2016 07/19/16   Final  . Fecal Occult Blood, POC 07/25/2016 Negative  Negative Final  . Card #2 Date 07/25/2016 07/20/16   Final  . Card #2 Fecal Occult Blod, POC 07/25/2016 Negative   Final  . Card #3 Date 07/25/2016 07/21/16   Final  . Card #3 Fecal Occult Blood, POC 07/25/2016 Negative   Final     Imaging: No results found.  Speciality Comments: No specialty comments available.    Procedures:  No procedures performed Allergies: Cymbalta [duloxetine hcl]; Demerol; Ivp dye [iodinated diagnostic agents]; Morphine and related; and Adhesive [tape]   Assessment / Plan:     Visit Diagnoses: Rheumatoid arthritis involving multiple sites with positive rheumatoid factor (HCC) - Positive RF, positive ANA. She has minimal synovitis over her MCPs and some synovial thickening overall she is doing quite well as regards to her rheumatoid arthritis.  Sjogren's syndrome with keratoconjunctivitis sicca ([place]): Over-the-counter products were discussed.  High risk medication use - triple therapy Arava SSZ PLQ (Orencia IV discontinued due to cost) - Plan: CBC with Differential/Platelet, COMPLETE METABOLIC PANEL WITH GFR, CBC with Differential/Platelet, COMPLETE METABOLIC PANEL WITH GFR and every 3 months.  Primary osteoarthritis of right knee: Chronic  pain  History of total knee replacement, right - 2012 Dr. Ricki Rodriguez: Doing well  Spondylosis of lumbar region without myelopathy or radiculopathy [place] pain is tolerable  Fibromyalgia: She continues to have some generalized pain and discomfort persists tolerable with the muscle relaxers  Osteoporosis - Status post Forteo.I will schedule bone density again. She was on Reclast infusions which she discontinued due to the co-pay cost. Once we have DEXA results available then we can discuss other treatment options.  Peripheral polyneuropathy (HCC)  Morbid obesity (HCC)  History of esophagitis - Barrett's / treated by Dr Collene Mares  History of diverticulitis  History of diabetes mellitus  Abnormality of gait  History of hyperlipidemia  Eosinophilic esophagitis    Orders: Orders Placed This Encounter  Procedures  . DG DXA FRACTURE ASSESSMENT  . CBC with Differential/Platelet  . COMPLETE METABOLIC PANEL WITH GFR   Meds ordered this encounter  Medications  . leflunomide (ARAVA) 20 MG tablet    Sig: Take 1 tablet (20 mg total) by mouth daily.    Dispense:  90 tablet    Refill:  0    Face-to-face time spent with patient was 30 minutes. 50% of time was spent in counseling and coordination of care.  Follow-Up Instructions: Return in about 5 months (around 03/15/2017) for Rheumatoid arthritis.   Bo Merino, MD  Note - This record has been created using Editor, commissioning.  Chart creation errors have been sought, but may not always  have been located. Such creation errors do not reflect on  the standard of medical care.

## 2016-10-10 ENCOUNTER — Other Ambulatory Visit: Payer: Self-pay | Admitting: Cardiology

## 2016-10-12 DIAGNOSIS — K200 Eosinophilic esophagitis: Secondary | ICD-10-CM | POA: Insufficient documentation

## 2016-10-13 ENCOUNTER — Ambulatory Visit (INDEPENDENT_AMBULATORY_CARE_PROVIDER_SITE_OTHER): Payer: PPO | Admitting: Rheumatology

## 2016-10-13 ENCOUNTER — Encounter: Payer: Self-pay | Admitting: Rheumatology

## 2016-10-13 VITALS — BP 124/68 | HR 78 | Resp 14 | Ht 61.5 in | Wt 222.0 lb

## 2016-10-13 DIAGNOSIS — M1711 Unilateral primary osteoarthritis, right knee: Secondary | ICD-10-CM | POA: Diagnosis not present

## 2016-10-13 DIAGNOSIS — M3501 Sicca syndrome with keratoconjunctivitis: Secondary | ICD-10-CM

## 2016-10-13 DIAGNOSIS — Z96651 Presence of right artificial knee joint: Secondary | ICD-10-CM | POA: Diagnosis not present

## 2016-10-13 DIAGNOSIS — M797 Fibromyalgia: Secondary | ICD-10-CM

## 2016-10-13 DIAGNOSIS — R269 Unspecified abnormalities of gait and mobility: Secondary | ICD-10-CM

## 2016-10-13 DIAGNOSIS — G629 Polyneuropathy, unspecified: Secondary | ICD-10-CM

## 2016-10-13 DIAGNOSIS — M0579 Rheumatoid arthritis with rheumatoid factor of multiple sites without organ or systems involvement: Secondary | ICD-10-CM

## 2016-10-13 DIAGNOSIS — Z8639 Personal history of other endocrine, nutritional and metabolic disease: Secondary | ICD-10-CM | POA: Diagnosis not present

## 2016-10-13 DIAGNOSIS — M47816 Spondylosis without myelopathy or radiculopathy, lumbar region: Secondary | ICD-10-CM

## 2016-10-13 DIAGNOSIS — K200 Eosinophilic esophagitis: Secondary | ICD-10-CM

## 2016-10-13 DIAGNOSIS — Z8719 Personal history of other diseases of the digestive system: Secondary | ICD-10-CM

## 2016-10-13 DIAGNOSIS — Z79899 Other long term (current) drug therapy: Secondary | ICD-10-CM | POA: Diagnosis not present

## 2016-10-13 DIAGNOSIS — M810 Age-related osteoporosis without current pathological fracture: Secondary | ICD-10-CM | POA: Diagnosis not present

## 2016-10-13 LAB — CBC WITH DIFFERENTIAL/PLATELET
BASOS PCT: 1 %
Basophils Absolute: 44 cells/uL (ref 0–200)
EOS ABS: 44 {cells}/uL (ref 15–500)
EOS PCT: 1 %
HCT: 40.4 % (ref 35.0–45.0)
Hemoglobin: 13.1 g/dL (ref 11.7–15.5)
Lymphocytes Relative: 30 %
Lymphs Abs: 1320 cells/uL (ref 850–3900)
MCH: 28.5 pg (ref 27.0–33.0)
MCHC: 32.4 g/dL (ref 32.0–36.0)
MCV: 88 fL (ref 80.0–100.0)
MONOS PCT: 9 %
MPV: 10.3 fL (ref 7.5–12.5)
Monocytes Absolute: 396 cells/uL (ref 200–950)
NEUTROS ABS: 2596 {cells}/uL (ref 1500–7800)
Neutrophils Relative %: 59 %
PLATELETS: 171 10*3/uL (ref 140–400)
RBC: 4.59 MIL/uL (ref 3.80–5.10)
RDW: 13.9 % (ref 11.0–15.0)
WBC: 4.4 10*3/uL (ref 3.8–10.8)

## 2016-10-13 LAB — COMPLETE METABOLIC PANEL WITH GFR
ALT: 10 U/L (ref 6–29)
AST: 14 U/L (ref 10–35)
Albumin: 3.9 g/dL (ref 3.6–5.1)
Alkaline Phosphatase: 91 U/L (ref 33–130)
BUN: 12 mg/dL (ref 7–25)
CO2: 23 mmol/L (ref 20–31)
CREATININE: 0.7 mg/dL (ref 0.50–0.99)
Calcium: 9 mg/dL (ref 8.6–10.4)
Chloride: 107 mmol/L (ref 98–110)
GFR, Est African American: >89 mL/min (ref >=60)
GFR, Est Non African American: >89 mL/min (ref >=60)
GLUCOSE: 96 mg/dL (ref 65–99)
Potassium: 3.9 mmol/L (ref 3.5–5.3)
SODIUM: 141 mmol/L (ref 135–146)
TOTAL PROTEIN: 6.2 g/dL (ref 6.1–8.1)
Total Bilirubin: 0.4 mg/dL (ref 0.2–1.2)

## 2016-10-13 MED ORDER — LEFLUNOMIDE 20 MG PO TABS: 20.0000 mg | ORAL_TABLET | Freq: Every day | ORAL | 0 refills | Status: DC

## 2016-10-13 NOTE — Patient Instructions (Signed)
Standing Labs We placed an order today for your standing lab work.    Please come back and get your standing labs in June 2018 and every 3 months.    We have open lab Monday through Friday from 8:30-11:30 AM and 1:30-4 PM at the office of Dr. Tresa Moore, PA.   The office is located at [address] [place], [place], [place] [address] No appointment is necessary.   Labs are drawn by [facility].  You may receive a bill from [facility] for your lab work.

## 2016-10-13 NOTE — Progress Notes (Addendum)
Rheumatology Medication Review by a Pharmacist Does the patient feel that his/her medications are working for him/her?  Yes Has the patient been experiencing any side effects to the medications prescribed?  No Does the patient have any problems obtaining medications?  No  Issues to address at subsequent visits: None   Pharmacist comments:  [name] is a pleasant 66 yo F who presents for follow up of rheumatoid arthritis.  She is currently taking leflunomide 20 mg daily, sulfasalazine 1000 mg BID, and hydroxychloroquine 200 mg in the morning and 100 mg in the evening.  Her most recent standing labs were on 08/11/16 which were normal.  She is due for labs this month and prefers to have them drawn today while she is in the office.  Patient had hydroxychloroquine eye exam on 06/20/16 which was normal.  Patient denies any questions or concerns regarding her medications at this time.   Patient will be getting a repeat bone density.  Briefly discussed treatment options for osteoporosis depending on patient's bone density.  Patient was previously on Reclast in the past but stopped as insurance was not covering the medication.  I reviewed with her that medicare will pay 80% of Reclast infusion cost, but patient will be responsible for the other 20%.  Advised patient I was unsure how much that 20% would be, but she could maybe call pre-services to see how much the 20% would be.  I reviewed grant foundations and identified that [employer] currently has funding available for osteoporosis.  Patient signed up for the [place] in clinic and was approved.  This grant will assist in paying $500 toward the cost of osteoporosis medications.  I am unsure if this grant will assist in covering Reclast, but could assist in coverage of Prolia if that is considered after patient's bone density.    Member ID: [id number] Group ID: [id number] RxBIN ID: [id number] PCN: [id number] Eligibility start date:  07/15/2016 Eligibility end date: 10/12/2017   Elisabeth Most, Pharm.D., BCPS, CPP Clinical Pharmacist Pager: [phone] Phone: [phone] 10/13/2016 11:08 AM

## 2016-10-16 DIAGNOSIS — G4733 Obstructive sleep apnea (adult) (pediatric): Secondary | ICD-10-CM | POA: Diagnosis not present

## 2016-10-16 DIAGNOSIS — R269 Unspecified abnormalities of gait and mobility: Secondary | ICD-10-CM | POA: Diagnosis not present

## 2016-10-16 NOTE — Progress Notes (Signed)
Labs normal.

## 2016-11-07 ENCOUNTER — Ambulatory Visit (INDEPENDENT_AMBULATORY_CARE_PROVIDER_SITE_OTHER): Payer: PPO | Admitting: Neurology

## 2016-11-07 ENCOUNTER — Encounter: Payer: Self-pay | Admitting: Neurology

## 2016-11-07 VITALS — BP 134/75 | HR 83 | Ht 61.5 in | Wt 219.5 lb

## 2016-11-07 DIAGNOSIS — G629 Polyneuropathy, unspecified: Secondary | ICD-10-CM

## 2016-11-07 NOTE — Progress Notes (Signed)
Reason for visit: Peripheral neuropathy  [name] is an 66 y.o. female  History of present illness:  [name] is a 66 year old right-handed white female with a history of rheumatoid arthritis and obesity. The patient has a peripheral neuropathy, she has done relatively well on gabapentin and with carbamazepine. She has cut back a little bit on the gabapentin, she has noticed an improvement in her gait instability. The patient is no longer having any of the lancinating pains in the feet and legs. She is resting fairly well at night, she does have sleep apnea. The patient is walking with a cane, she no longer needs a walker. She comes back to the office today for an evaluation.  Past Medical History:  Diagnosis Date  . Arthritis    Rheumatoid arthritis,   . Blood transfusion    1981  . Chronic diastolic CHF (congestive heart failure) ([place])   . Fibromyalgia   . Heart murmur    as a child  . Hiatal hernia    sjorgens syndrome  . OSA (obstructive sleep apnea)   . Peripheral autonomic neuropathy of unknown cause     Past Surgical History:  Procedure Laterality Date  . ABDOMINAL HYSTERECTOMY     BTL, BSO  . CHOLECYSTECTOMY    . DIAGNOSTIC LAPAROSCOPY     x3  . KNEE ARTHROSCOPY     x2  . patotid cystectomy    . TENDON REPAIR  1980   left ankle and tibia  . TONSILLECTOMY    . TOTAL KNEE ARTHROPLASTY  06/21/2011   Procedure: TOTAL KNEE ARTHROPLASTY;  Surgeon: Gearlean Alf;  Location: [facility];  Service: Orthopedics;  Laterality: Right;  . TUBAL LIGATION      Family History  Problem Relation Age of Onset  . Alzheimer's disease Mother   . Heart attack Mother   . Hypertension Mother   . Glaucoma Mother   . Cancer Father   . Heart attack Brother   . Heart disease Brother   . Glaucoma Brother   . Hyperlipidemia Brother   . Glaucoma Brother     Social history:  reports that she has never smoked. She has never used smokeless tobacco. She reports that she drinks  alcohol. She reports that she does not use drugs.    Allergies  Allergen Reactions  . Cymbalta [Duloxetine Hcl]     manic  . Demerol Hives and Other (See Comments)    Fever   . Ivp Dye [Iodinated Diagnostic Agents] Hives and Other (See Comments)    Fever   . Morphine And Related Other (See Comments)    ineffective  . Adhesive [Tape] Rash    Medications:  Prior to Admission medications   Medication Sig Start Date End Date Taking? Authorizing Provider  Cholecalciferol (VITAMIN D3) 5000 units TABS Take by mouth daily.   Yes Historical Provider, MD  Cholesterol POWD Take by mouth as needed.    Yes Historical Provider, MD  cycloSPORINE (RESTASIS) 0.05 % ophthalmic emulsion Place 2 drops into both eyes 2 (two) times daily.    Yes Historical Provider, MD  diphenoxylate-atropine (LOMOTIL) 2.5-0.025 MG per tablet Take 1 tablet by mouth 4 (four) times daily as needed for diarrhea or loose stools.   Yes Historical Provider, MD  furosemide (LASIX) 20 MG tablet TAKE 1 TABLET (20 MG TOTAL) BY MOUTH DAILY. 05/29/16  Yes Sueanne Margarita, MD  gabapentin (NEURONTIN) 300 MG capsule Take 300 mg by mouth. Take 2 capsules  every morning and 3 capsules at bedtime   Yes Historical Provider, MD  HYDROcodone-acetaminophen (NORCO/VICODIN) 5-325 MG tablet Take 1 tablet by mouth at bedtime as needed. 08/17/16  Yes Naitik Panwala, PA-C  hydroxychloroquine (PLAQUENIL) 200 MG tablet TAKE 1 TABLET BY MOUTH EVERY MORNING AND 1/2 TABLET AT BEDTIME. 09/11/16  Yes Naitik Panwala, PA-C  hyoscyamine (LEVBID) 0.375 MG 12 hr tablet Take 0.375 mg by mouth as needed.    Yes Historical Provider, MD  leflunomide (ARAVA) 20 MG tablet Take 1 tablet (20 mg total) by mouth daily. 10/13/16  Yes Bo Merino, MD  lidocaine (LIDODERM) 5 % Place 1 patch onto the skin as needed. Remove & Discard patch within 12 hours or as directed by MD   Yes Historical Provider, MD  metoprolol succinate (TOPROL-XL) 25 MG 24 hr tablet TAKE 1 TABLET BY MOUTH  DAILY. 10/11/16  Yes Sueanne Margarita, MD  Multiple Vit-Min-Calcium-FA (FOLGARD OS) 500-1.1 MG TABS Take 1 tablet by mouth 2 (two) times daily.   Yes Historical Provider, MD  pantoprazole (PROTONIX) 40 MG tablet Take 40 mg by mouth daily.   Yes Historical Provider, MD  potassium chloride (K-DUR,KLOR-CON) 10 MEQ tablet Take daily 07/18/16  Yes Deborah Opalski, DO  Probiotic Product (Frazee) 170 MG CAPS Take 1 capsule by mouth every morning.   Yes Historical Provider, MD  promethazine (PHENERGAN) 25 MG tablet Take 25 mg by mouth every 6 (six) hours as needed.     Yes Historical Provider, MD  silver sulfADIAZINE (SILVADENE) 1 % cream Apply 1 application topically as needed.   Yes Historical Provider, MD  sulfaSALAzine (AZULFIDINE) 500 MG EC tablet TAKE 2 TABLETS BY MOUTH 2 TIMES A DAY. 09/11/16  Yes Naitik Panwala, PA-C  tiZANidine (ZANAFLEX) 4 MG tablet TAKE 1 TABLET BY MOUTH DAILY AT BEDTIME 07/25/16  Yes Naitik Panwala, PA-C  triamcinolone ointment (KENALOG) 0.1 % To affected areas of itching Three times daily 03/22/16  Yes Deborah Opalski, DO  niacin (NIASPAN) 1000 MG CR tablet Take 1 tablet (1,000 mg total) by mouth at bedtime. Patient not taking: Reported on 11/07/2016 08/23/16   Mellody Dance, DO    ROS:  Out of a complete 14 system review of symptoms, the patient complains only of the following symptoms, and all other reviewed systems are negative.  Ringing in the ears, runny nose Eye itching Cough Palpitations of the heart Cold intolerance Sleep apnea Environmental allergies, incontinence of the bladder Joint swelling Joint pain, walking difficulty Itching Swollen lymph nodes Numbness, tremors  Blood pressure 134/75, pulse 83, height 5' 1.5" (1.562 m), weight 219 lb 8 oz (99.6 kg).  Physical Exam  General: The patient is alert and cooperative at the time of the examination. The patient is markedly obese.  Skin: No significant peripheral edema is  noted.   Neurologic Exam  Mental status: The patient is alert and oriented x 3 at the time of the examination. The patient has apparent normal recent and remote memory, with an apparently normal attention span and concentration ability.   Cranial nerves: Facial symmetry is present. Speech is normal, no aphasia or dysarthria is noted. Extraocular movements are full. Visual fields are full.  Motor: The patient has good strength in all 4 extremities.  Sensory examination: Soft touch sensation is symmetric on the face, arms, and legs.  Coordination: The patient has good finger-nose-finger and heel-to-shin bilaterally.  Gait and station: The patient has a slightly wide-based gait, the patient walks with a cane. Tandem gait  is unsteady. Romberg is negative. No drift is seen.  Reflexes: Deep tendon reflexes are symmetric, but are depressed.   Assessment/Plan:  1. Peripheral neuropathy  2. Gait instability  3. Rheumatoid arthritis  The patient is doing relatively well at this time, we will try to get her off of the carbamazepine, she will go to 100 mg twice daily for 3 weeks, then stop the drug. If she has recurrence of pain, she can go back on the medication. In the future, we may try to lower the dose of the gabapentin as well. She will follow-up in 6 months.  Jill Alexanders MD 11/07/2016 2:48 PM  [facility] [address] [place], [place] [address]  Phone [phone] Fax [phone]

## 2016-11-07 NOTE — Patient Instructions (Signed)
   With the carbamazepine 200 mg tablets, take 1/2 tablet twice a day for 3 weeks, then stop the medication.

## 2016-11-08 ENCOUNTER — Other Ambulatory Visit: Payer: Self-pay | Admitting: Rheumatology

## 2016-11-08 ENCOUNTER — Telehealth: Payer: Self-pay | Admitting: Rheumatology

## 2016-11-08 NOTE — Telephone Encounter (Signed)
ok 

## 2016-11-08 NOTE — Telephone Encounter (Signed)
Patient left message on voice mail stating that she left her Rx TIZANIDINE down in [place].  She states that she needs  a new Rx called in @ [facility] today or tomorrow morning.

## 2016-11-08 NOTE — Telephone Encounter (Signed)
Prescription sent to the pharmacy.

## 2016-11-08 NOTE — Telephone Encounter (Signed)
Last Visit: 10/13/16 Next Visit: 03/15/17  Okay to refill Tizanidine?

## 2016-11-16 DIAGNOSIS — R269 Unspecified abnormalities of gait and mobility: Secondary | ICD-10-CM | POA: Diagnosis not present

## 2016-11-16 DIAGNOSIS — G4733 Obstructive sleep apnea (adult) (pediatric): Secondary | ICD-10-CM | POA: Diagnosis not present

## 2016-11-17 DIAGNOSIS — R269 Unspecified abnormalities of gait and mobility: Secondary | ICD-10-CM | POA: Diagnosis not present

## 2016-11-17 DIAGNOSIS — G4733 Obstructive sleep apnea (adult) (pediatric): Secondary | ICD-10-CM | POA: Diagnosis not present

## 2016-11-21 ENCOUNTER — Ambulatory Visit: Payer: PPO | Admitting: Family Medicine

## 2016-11-24 ENCOUNTER — Telehealth: Payer: Self-pay | Admitting: Family Medicine

## 2016-11-24 DIAGNOSIS — M818 Other osteoporosis without current pathological fracture: Secondary | ICD-10-CM

## 2016-11-24 NOTE — Telephone Encounter (Signed)
Patient is requesting an order for a bone density. [facility]

## 2016-11-27 NOTE — Addendum Note (Signed)
Addended by: Fonnie Mu on: 11/27/2016 10:32 AM   Modules accepted: Orders

## 2016-11-27 NOTE — Telephone Encounter (Signed)
Order placed per pt request.  [name], CMA

## 2016-12-07 ENCOUNTER — Other Ambulatory Visit: Payer: Self-pay | Admitting: Rheumatology

## 2016-12-07 NOTE — Telephone Encounter (Signed)
10/13/16 last visit  03/15/17 next visit  Labs 10/13/16 Eye exam 06/20/16 Ok to refill per Dr Estanislado Pandy

## 2016-12-16 DIAGNOSIS — R269 Unspecified abnormalities of gait and mobility: Secondary | ICD-10-CM | POA: Diagnosis not present

## 2016-12-16 DIAGNOSIS — G4733 Obstructive sleep apnea (adult) (pediatric): Secondary | ICD-10-CM | POA: Diagnosis not present

## 2016-12-19 ENCOUNTER — Other Ambulatory Visit: Payer: Self-pay

## 2016-12-19 ENCOUNTER — Other Ambulatory Visit: Payer: Self-pay | Admitting: Neurology

## 2016-12-19 DIAGNOSIS — M8589 Other specified disorders of bone density and structure, multiple sites: Secondary | ICD-10-CM

## 2016-12-19 NOTE — Telephone Encounter (Signed)
Called and spoke with patient. She stated she was not able to taper off of carbamazepine. Neuropathy pain became severe. She is currently taking 1/2 tablet in the morning and 1 tablet at night. Advised I will send request to CW,MD.  If there are any further questions or concerns, we will call back. If not, we will refill.  She verbalized understanding.

## 2017-01-09 ENCOUNTER — Other Ambulatory Visit: Payer: Self-pay | Admitting: Family Medicine

## 2017-01-09 ENCOUNTER — Other Ambulatory Visit: Payer: Self-pay | Admitting: Neurology

## 2017-01-09 ENCOUNTER — Other Ambulatory Visit: Payer: Self-pay | Admitting: Rheumatology

## 2017-01-09 MED ORDER — GABAPENTIN 300 MG PO CAPS: 300.0000 mg | ORAL_CAPSULE | Freq: Two times a day (BID) | ORAL | 6 refills | Status: DC

## 2017-01-09 NOTE — Telephone Encounter (Signed)
ok 

## 2017-01-09 NOTE — Telephone Encounter (Signed)
10/13/16 last visit  03/15/17 next visit   Okay to refill Phenergan?

## 2017-01-10 ENCOUNTER — Other Ambulatory Visit: Payer: PPO

## 2017-01-12 ENCOUNTER — Other Ambulatory Visit: Payer: PPO

## 2017-01-16 ENCOUNTER — Other Ambulatory Visit: Payer: PPO

## 2017-01-16 DIAGNOSIS — G4733 Obstructive sleep apnea (adult) (pediatric): Secondary | ICD-10-CM | POA: Diagnosis not present

## 2017-01-16 DIAGNOSIS — R269 Unspecified abnormalities of gait and mobility: Secondary | ICD-10-CM | POA: Diagnosis not present

## 2017-01-18 ENCOUNTER — Ambulatory Visit
Admission: RE | Admit: 2017-01-18 | Discharge: 2017-01-18 | Disposition: A | Discharge status: Home / Self Care | Payer: PPO | Source: Ambulatory Visit | Attending: Family Medicine | Admitting: Family Medicine

## 2017-01-18 DIAGNOSIS — M8589 Other specified disorders of bone density and structure, multiple sites: Secondary | ICD-10-CM | POA: Diagnosis not present

## 2017-01-19 ENCOUNTER — Ambulatory Visit: Payer: PPO | Admitting: Family Medicine

## 2017-01-31 ENCOUNTER — Ambulatory Visit (INDEPENDENT_AMBULATORY_CARE_PROVIDER_SITE_OTHER): Payer: PPO | Admitting: Family Medicine

## 2017-01-31 ENCOUNTER — Encounter: Payer: Self-pay | Admitting: Family Medicine

## 2017-01-31 VITALS — BP 117/72 | HR 72 | Temp 98.7°F | Ht 61.5 in | Wt 217.0 lb

## 2017-01-31 DIAGNOSIS — N6002 Solitary cyst of left breast: Secondary | ICD-10-CM | POA: Insufficient documentation

## 2017-01-31 NOTE — Patient Instructions (Signed)
Breast Cyst A breast cyst is a sac in the breast that is filled with fluid. Breast cysts are usually noncancerous (benign). They are common among women, and they are most often located in the upper, outer portion of the breast. One or more cysts may develop. They form when fluid builds up inside of the breast glands. There are several types of breast cysts:  Macrocyst. This is a cyst that is about 2 inches (5.1 cm) across (in diameter).  Microcyst. This is a very small cyst that you cannot feel, but it can be seen with imaging tests such as an X-ray of the breast (mammogram) or ultrasound.  Galactocele. This is a cyst that contains milk. It may develop if you suddenly stop breastfeeding.  Breast cysts do not increase your risk of breast cancer. They usually disappear after menopause, unless you take artificial hormones (are on hormone therapy). What are the causes? The exact cause of breast cysts is not known. Possible causes include:  Blockage of tubes (ducts) in the breast glands, which leads to fluid buildup. Duct blockage may result from: ? Fibrocystic breast changes. This is a common, benign condition that occurs when women go through hormonal changes during the menstrual cycle. This is a common cause of multiple breast cysts. ? Overgrowth of breast tissue or breast glands. ? Scar tissue in the breast from previous surgery.  Changes in certain female hormones (estrogen and progesterone).  What increases the risk? You may be more likely to develop breast cysts if you have not gone through menopause. What are the signs or symptoms? Symptoms of a breast cyst may include:  Feeling one or more smooth, round, soft lumps (like grapes) in the breast that are easily moveable. The lump(s) may get bigger and more painful before your period and get smaller after your period.  Breast discomfort or pain.  How is this diagnosed? A cyst can be felt during a physical exam by your health care  provider. A mammogram and ultrasound will be done to confirm the diagnosis. Fluid may be removed from the cyst with a needle (fine-needle aspiration) and tested to make sure the cyst is not cancerous. How is this treated? Treatment may not be necessary. Your health care provider may monitor the cyst to see if it goes away on its own. If the cyst is uncomfortable or gets bigger, or if you do not like how the cyst makes your breast look, you may need treatment. Treatment may include:  Hormone treatment.  Fine-needle aspiration, to drain fluid from the cyst. There is a chance of the cyst coming back (recurring) after aspiration.  Surgery to remove the cyst.  Follow these instructions at home:  See your health care provider regularly. ? Get a yearly physical exam. ? If you are 20-40 years old, get a clinical breast exam every 1-3 years. After age 40, get this exam every year. ? Get mammograms as often as directed.  Do a breast self-exam every month, or as often as directed. Having many breast cysts, or "lumpy" breasts, may make it harder to feel for new lumps. Understand how your breasts normally look and feel, and write down any changes in your breasts so you can tell your health care provider about the changes. A breast self-exam involves: ? Comparing your breasts in the mirror. ? Looking for visible changes in your skin or nipples. ? Feeling for lumps or changes.  Take over-the-counter and prescription medicines only as told by your health care   provider.  Wear a supportive bra, especially when exercising.  Follow instructions from your health care provider about eating and drinking restrictions. ? Avoid caffeine. ? Cut down on salt (sodium) in what you eat and drink, especially before your menstrual period. Too much sodium can cause fluid buildup (retention), breast swelling, and discomfort.  Keep all follow-up visits as told your health care provider. This is important. Contact a  health care provider if:  You feel, or think you feel, a lump in your breast.  You notice that both breasts look or feel different than usual.  Your breast is still causing pain after your menstrual period is over.  You find new lumps or bumps that were not there before.  You feel lumps in your armpit (axilla). Get help right away if:  You have severe pain, tenderness, redness, or warmth in your breast.  You have fluid or blood leaking from your nipple.  Your breast lump becomes hard and painful.  You notice dimpling or wrinkling of the breast or nipple. This information is not intended to replace advice given to you by your health care provider. Make sure you discuss any questions you have with your health care provider. Document Released: 07/31/2005 Document Revised: 04/21/2016 Document Reviewed: 04/21/2016 Elsevier Interactive Patient Education  2017 [employer]vier Inc.  

## 2017-01-31 NOTE — Progress Notes (Signed)
Pt here for an acute care OV today   Impression and Recommendations:    1. Cyst (solitary) of breast, left    - Order for ultrasound (breast to be done today or tomorrow.  The patient was counseled, risk factors were discussed, anticipatory guidance given.   Orders Placed This Encounter  Procedures  . MM DIAG BREAST TOMO UNI LEFT  . US BREAST LTD UNI LEFT INC AXILLA     Please see AVS handed out to patient at the end of our visit for further patient instructions/ counseling done pertaining to today's office visit.   Return for F-up of current med issues as previously d/c pt.     Note: This document was prepared using Dragon voice recognition software and may include unintentional dictation errors.  Mellody Dance 2:41 PM --------------------------------------------------------------------------------------------------------------------------------------------------------------------------------------------------------------------------------------------    Subjective:    CC:  Chief Complaint  Patient presents with  . Skin Problem    2 bumps on back noticed 1 month ago   . Breast Problem    skin lesion under left side x 6 wks - bleeding 2 weeks ago     HPI: [name] is a 66 y.o. female who presents to [place] at [facility] today for issues as discussed below.   Has had L breast known cyst which had been ultrasound in October 2017.  She had nothing external on her skin at that time.  It was not a palpable cyst either.    But, "Now something is on the outside".   It has been there for 6 wks she has noticed it.   Occ bloody d/c that she notices on her bra strap at times- 2-3 times.  No pain, no sx at all otherwise.       Problem  Cyst (Solitary) of Breast, Left     Wt Readings from Last 3 Encounters:  01/31/17 217 lb (98.4 kg)  11/07/16 219 lb 8 oz (99.6 kg)  10/13/16 222 lb (100.7 kg)   BP Readings from Last 3 Encounters:    01/31/17 117/72  11/07/16 134/75  10/13/16 124/68   BMI Readings from Last 3 Encounters:  01/31/17 40.34 kg/m  11/07/16 40.80 kg/m  10/13/16 41.27 kg/m     Patient Care Team    Relationship Specialty Notifications Start End  Mellody Dance, DO PCP - General Family Medicine  01/19/16   Bo Merino, MD Consulting Physician Rheumatology  02/02/14   Sueanne Margarita, MD Consulting Physician Cardiology  05/23/16   Gaynelle Arabian, MD Consulting Physician Orthopedic Surgery  05/23/16   Kathrynn Ducking, MD Consulting Physician Neurology  05/23/16   Juanita Craver, MD Consulting Physician Gastroenterology  05/23/16   [place], Amy, [place] Physician Dermatology  05/23/16      Patient Active Problem List   Diagnosis Date Noted  . Elevated triglycerides with high cholesterol 08/23/2016    Priority: High  . Peripheral polyneuropathy 02/02/2014    Priority: High  . Morbid obesity ([place]) 11/17/2013    Priority: High  . OSA (obstructive sleep apnea)     Priority: High  . Low serum HDL 08/23/2016    Priority: Medium  . GERD (gastroesophageal reflux disease) 01/19/2016    Priority: Medium  . Fibromyalgia     Priority: Medium  . Sjoegren syndrome ([place]) 02/29/2016    Priority: Low  . On potassium wasting diuretic therapy 02/29/2016    Priority: Low  . Glucose intolerance (impaired glucose tolerance) 01/19/2016  Priority: Low  . Chronic diastolic CHF (congestive heart failure) ([place]) 04/20/2015    Priority: Low  . Rheumatoid arthritis ([place])     Priority: Low  . Cyst (solitary) of breast, left 01/31/2017  . Eosinophilic esophagitis 89/21/1940  . History of hyperlipidemia 10/04/2016  . 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 History of diabetes mellitus 09/09/2016  . History of diverticulitis 09/03/2016  . Osteoporosis 09/03/2016  . ANA positive 09/02/2016  . High risk medication use 09/02/2016  . History of esophagitis 09/02/2016  . Breast cyst, left 06/14/2016   . Encounter for wellness examination 05/23/2016  . Abnormality of gait 05/09/2016  . Hypokalemia 03/22/2016  . Heart murmur   . Hiatal hernia   . PVC's (premature ventricular contractions)   . 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 Osteoarthritis of right knee 06/22/2011    Past Medical history, Surgical history, Family history, Social history, Allergies and Medications have been entered into the medical record, reviewed and changed as needed.    Current Meds  Medication Sig  . carbamazepine (TEGRETOL) 200 MG tablet 1/2 tablet in the morning and 1 tablet in the evening  . Cholecalciferol (VITAMIN D3) 5000 units TABS Take by mouth daily.  . Cholesterol POWD Take by mouth as needed.   . cycloSPORINE (RESTASIS) 0.05 % ophthalmic emulsion Place 2 drops into both eyes 2 (two) times daily.   . diphenoxylate-atropine (LOMOTIL) 2.5-0.025 MG per tablet Take 1 tablet by mouth 4 (four) times daily as needed for diarrhea or loose stools.  . furosemide (LASIX) 20 MG tablet TAKE 1 TABLET (20 MG TOTAL) BY MOUTH DAILY.  [name] gabapentin (NEURONTIN) 300 MG capsule TAKE 2 CAPSULES IN THE MORNING AND 5 CAPSULES AT BEDTIME  . gabapentin (NEURONTIN) 300 MG capsule Take 1 capsule (300 mg total) by mouth 2 (two) times daily. Take 2 capsules every morning and 3 capsules at bedtime  . HYDROcodone-acetaminophen (NORCO/VICODIN) 5-325 MG tablet Take 1 tablet by mouth at bedtime as needed.  . hydroxychloroquine (PLAQUENIL) 200 MG tablet TAKE 1 TABLET BY MOUTH EVERY MORNING AND 1/2 TABLET AT BEDTIME.  . hyoscyamine (LEVBID) 0.375 MG 12 hr tablet Take 0.375 mg by mouth as needed.   . leflunomide (ARAVA) 20 MG tablet Take 1 tablet (20 mg total) by mouth daily.  [name] lidocaine (LIDODERM) 5 % Place 1 patch onto the skin as needed. Remove & Discard patch within 12 hours or as directed by MD  . metoprolol succinate (TOPROL-XL) 25 MG 24 hr tablet TAKE 1 TABLET BY MOUTH DAILY.  . Multiple Vit-Min-Calcium-FA  (FOLGARD OS) 500-1.1 MG TABS Take 1 tablet by mouth 2 (two) times daily.  . niacin (NIASPAN) 1000 MG CR tablet Take 1 tablet (1,000 mg total) by mouth at bedtime.  . pantoprazole (PROTONIX) 40 MG tablet Take 40 mg by mouth daily.  . potassium chloride (K-DUR) 10 MEQ tablet TAKE 1 TABLET BY MOUTH DAILY  . Probiotic Product ([place]) 170 MG CAPS Take 1 capsule by mouth every morning.  . promethazine (PHENERGAN) 25 MG tablet TAKE 1 TABLET BY MOUTH EVERY 6 HOURS AS NEEDED.  [name] silver sulfADIAZINE (SILVADENE) 1 % cream Apply 1 application topically as needed.  . sulfaSALAzine (AZULFIDINE) 500 MG EC tablet TAKE 2 TABLETS BY MOUTH 2 TIMES A DAY.  [name] tiZANidine (ZANAFLEX) 4 MG tablet TAKE 1 TABLET BY MOUTH DAILY AT BEDTIME  . triamcinolone ointment (KENALOG) 0.1 % To affected areas of itching Three times daily    Allergies:  Allergies  Allergen Reactions  . Cymbalta [Duloxetine Hcl]     manic  . Demerol Hives and Other (See Comments)    Fever   . Ivp Dye [Iodinated Diagnostic Agents] Hives and Other (See Comments)    Fever   . Morphine And Related Other (See Comments)    ineffective  . Adhesive [Tape] Rash     Review of Systems: General:   Denies fever, chills, unexplained weight loss.  Optho/Auditory:   Denies visual changes, blurred vision/LOV Respiratory:   Denies wheeze, DOE more than baseline levels.  Cardiovascular:   Denies chest pain, palpitations, new onset peripheral edema  Gastrointestinal:   Denies nausea, vomiting, diarrhea, abd pain.  Genitourinary: Denies dysuria, freq/ urgency, flank pain or discharge from genitals.  Endocrine:     Denies hot or cold intolerance, polyuria, polydipsia. Musculoskeletal:   Denies unexplained myalgias, joint swelling, unexplained arthralgias, gait problems.  Skin:  Denies new onset rash, suspicious lesions Neurological:     Denies dizziness, unexplained weakness, numbness  Psychiatric/Behavioral:   Denies mood changes,  suicidal or homicidal ideations, hallucinations    Objective:   Blood pressure 117/72, pulse 72, temperature 98.7 F (37.1 C), height 5' 1.5" (1.562 m), weight 217 lb (98.4 kg), SpO2 97 %. Body mass index is 40.34 kg/m. General:  Well Developed, well nourished, appropriate for stated age.  Neuro:  Alert and oriented,  extra-ocular muscles intact  HEENT:  Normocephalic, atraumatic, neck supple Skin:  no gross rash, warm, pink.  Left breast around 5:00 position near skin fold is a 1 cm indurated spherical mass and superficial skin.  Not very mobile.  There is no fluctuance or pustule.  No evidence of infection/ cellulitis, the rest of her left breast is unremarkable, no palpable masses appreciated.  No lymphadenopathy in the axilla region.  No bloody nipple discharge. unlabored. Vascular:  Ext warm, no cyanosis apprec.; cap RF less 2 sec. Psych:  No HI/SI, judgement and insight good, Euthymic mood. Full Affect.

## 2017-02-01 ENCOUNTER — Ambulatory Visit
Admission: RE | Admit: 2017-02-01 | Discharge: 2017-02-01 | Disposition: A | Discharge status: Home / Self Care | Payer: PPO | Source: Ambulatory Visit | Attending: Family Medicine | Admitting: Family Medicine

## 2017-02-01 DIAGNOSIS — N6323 Unspecified lump in the left breast, lower outer quadrant: Secondary | ICD-10-CM | POA: Diagnosis not present

## 2017-02-01 DIAGNOSIS — N6002 Solitary cyst of left breast: Secondary | ICD-10-CM

## 2017-02-01 DIAGNOSIS — R928 Other abnormal and inconclusive findings on diagnostic imaging of breast: Secondary | ICD-10-CM | POA: Diagnosis not present

## 2017-02-03 ENCOUNTER — Ambulatory Visit (HOSPITAL_COMMUNITY)
Admission: EM | Admit: 2017-02-03 | Discharge: 2017-02-03 | Disposition: A | Discharge status: Home / Self Care | Payer: PPO | Attending: Physician Assistant | Admitting: Physician Assistant

## 2017-02-03 ENCOUNTER — Encounter (HOSPITAL_COMMUNITY): Payer: Self-pay | Admitting: Emergency Medicine

## 2017-02-03 DIAGNOSIS — S61012A Laceration without foreign body of left thumb without damage to nail, initial encounter: Secondary | ICD-10-CM | POA: Diagnosis not present

## 2017-02-03 DIAGNOSIS — W268XXA Contact with other sharp object(s), not elsewhere classified, initial encounter: Secondary | ICD-10-CM

## 2017-02-03 NOTE — ED Provider Notes (Signed)
CSN: [id number]     Arrival date & time 02/03/17  1452 History   None    Chief Complaint  Patient presents with  . Finger Injury   (Consider location/radiation/quality/duration/timing/severity/associated sxs/prior Treatment) 66 yo female comes in with 1 day history of left thumb laceration. She was removing her food processor, and sliced her left thumb on the blade last night. Was able to control bleeding but would like to have checked. She washed wound with soap and water. Denies foreign object. Denies fever, numbness, tingling. Denies increased warmth, redness. Patient is on NSAID for RA, which does cause more bleeding than her baseline. No blood disorders.       Past Medical History:  Diagnosis Date  . Arthritis    Rheumatoid arthritis,   . Blood transfusion    1981  . Chronic diastolic CHF (congestive heart failure) ([place])   . Fibromyalgia   . Heart murmur    as a child  . Hiatal hernia    sjorgens syndrome  . OSA (obstructive sleep apnea)   . Peripheral autonomic neuropathy of unknown cause    Past Surgical History:  Procedure Laterality Date  . ABDOMINAL HYSTERECTOMY     BTL, BSO  . CHOLECYSTECTOMY    . DIAGNOSTIC LAPAROSCOPY     x3  . KNEE ARTHROSCOPY     x2  . patotid cystectomy    . TENDON REPAIR  1980   left ankle and tibia  . TONSILLECTOMY    . TOTAL KNEE ARTHROPLASTY  06/21/2011   Procedure: TOTAL KNEE ARTHROPLASTY;  Surgeon: Gearlean Alf;  Location: [facility];  Service: Orthopedics;  Laterality: Right;  . TUBAL LIGATION     Family History  Problem Relation Age of Onset  . Alzheimer's disease Mother   . Heart attack Mother   . Hypertension Mother   . Glaucoma Mother   . Cancer Father   . Heart attack Brother   . Heart disease Brother   . Glaucoma Brother   . Hyperlipidemia Brother   . Glaucoma Brother    Social History  Substance Use Topics  . Smoking status: Never Smoker  . Smokeless tobacco: Never Used  . Alcohol use Yes     Comment: less  than 1 month   OB History    No data available     Review of Systems  Constitutional: Negative for chills, diaphoresis and fever.  Musculoskeletal: Negative for arthralgias, joint swelling and myalgias.  Skin: Positive for wound. Negative for rash.  Neurological: Negative for numbness.    Allergies  Cymbalta [duloxetine hcl]; Demerol; Ivp dye [iodinated diagnostic agents]; Morphine and related; and Adhesive [tape]  Home Medications   Prior to Admission medications   Medication Sig Start Date End Date Taking? Authorizing Provider  carbamazepine (TEGRETOL) 200 MG tablet 1/2 tablet in the morning and 1 tablet in the evening 12/19/16   Kathrynn Ducking, MD  Cholecalciferol (VITAMIN D3) 5000 units TABS Take by mouth daily.    [provider]  Cholesterol POWD Take by mouth as needed.     [provider]  cycloSPORINE (RESTASIS) 0.05 % ophthalmic emulsion Place 2 drops into both eyes 2 (two) times daily.     [provider]  diphenoxylate-atropine (LOMOTIL) 2.5-0.025 MG per tablet Take 1 tablet by mouth 4 (four) times daily as needed for diarrhea or loose stools.    [provider]  furosemide (LASIX) 20 MG tablet TAKE 1 TABLET (20 MG TOTAL) BY MOUTH DAILY. 05/29/16  Sueanne Margarita, MD  gabapentin (NEURONTIN) 300 MG capsule TAKE 2 CAPSULES IN THE MORNING AND 5 CAPSULES AT BEDTIME 01/09/17   Kathrynn Ducking, MD  gabapentin (NEURONTIN) 300 MG capsule Take 1 capsule (300 mg total) by mouth 2 (two) times daily. Take 2 capsules every morning and 3 capsules at bedtime 01/09/17   Kathrynn Ducking, MD  HYDROcodone-acetaminophen (NORCO/VICODIN) 5-325 MG tablet Take 1 tablet by mouth at bedtime as needed. 08/17/16   Panwala, Naitik, PA-C  hydroxychloroquine (PLAQUENIL) 200 MG tablet TAKE 1 TABLET BY MOUTH EVERY MORNING AND 1/2 TABLET AT BEDTIME. 12/07/16   Deveshwar, Abel Presto, MD  hyoscyamine (LEVBID) 0.375 MG 12 hr tablet Take 0.375 mg by mouth as needed.     [provider]  leflunomide (ARAVA) 20 MG tablet Take 1 tablet (20 mg total) by mouth daily. 10/13/16   Bo Merino, MD  lidocaine (LIDODERM) 5 % Place 1 patch onto the skin as needed. Remove & Discard patch within 12 hours or as directed by MD    [provider]  metoprolol succinate (TOPROL-XL) 25 MG 24 hr tablet TAKE 1 TABLET BY MOUTH DAILY. 10/11/16   Sueanne Margarita, MD  Multiple Vit-Min-Calcium-FA (FOLGARD OS) 500-1.1 MG TABS Take 1 tablet by mouth 2 (two) times daily.    [provider]  niacin (NIASPAN) 1000 MG CR tablet Take 1 tablet (1,000 mg total) by mouth at bedtime. 08/23/16   Opalski, Deborah, DO  pantoprazole (PROTONIX) 40 MG tablet Take 40 mg by mouth daily.    [provider]  potassium chloride (K-DUR) 10 MEQ tablet TAKE 1 TABLET BY MOUTH DAILY 01/09/17   Opalski, Neoma Laming, DO  Probiotic Product ([place]) 170 MG CAPS Take 1 capsule by mouth every morning.    [provider]  promethazine (PHENERGAN) 25 MG tablet TAKE 1 TABLET BY MOUTH EVERY 6 HOURS AS NEEDED. 01/09/17   Bo Merino, MD  silver sulfADIAZINE (SILVADENE) 1 % cream Apply 1 application topically as needed.    [provider]  sulfaSALAzine (AZULFIDINE) 500 MG EC tablet TAKE 2 TABLETS BY MOUTH 2 TIMES A DAY. 12/07/16   Bo Merino, MD  tiZANidine (ZANAFLEX) 4 MG tablet TAKE 1 TABLET BY MOUTH DAILY AT BEDTIME 11/08/16   Bo Merino, MD  triamcinolone ointment (KENALOG) 0.1 % To affected areas of itching Three times daily 03/22/16   Mellody Dance, DO   Meds Ordered and Administered this Visit  Medications - No data to display  BP (!) 121/91 (BP Location: Right Arm)   Pulse (!) 1   Temp 98.6 F (37 C) (Oral)   Resp (!) 1   Ht 5\' 1"  (1.549 m)   Wt 217 lb (98.4 kg)   SpO2 97%   BMI 41.00 kg/m  No data found.   Physical Exam  Constitutional: She is oriented to person, place, and time. She appears well-developed and well-nourished. No  distress.  HENT:  Head: Normocephalic and atraumatic.  Eyes: Conjunctivae are normal. Pupils are equal, round, and reactive to light.  Cardiovascular: Normal rate and regular rhythm.   Pulmonary/Chest: Effort normal and breath sounds normal.  Musculoskeletal:  1.5cm laceration of left thumb. No swelling, redness, increased warmth. Full ROM of fingers and wrist. Strength normal and equal bilaterally. Sensation intact. No bony tenderness  Neurological: She is alert and oriented to person, place, and time. She has normal strength. No sensory deficit.  Skin: Skin is warm and dry. Capillary refill takes less than 2  seconds.  Psychiatric: She has a normal mood and affect. Her behavior is normal. Judgment normal.    Urgent Care Course     .Marland KitchenLaceration Repair Date/Time: 02/03/2017 4:07 PM Performed by: Tasia Catchings Ramani Riva V Authorized by: Cathlean Sauer V   Consent:    Consent obtained:  Verbal   Consent given by:  Patient   Risks discussed:  Infection, pain, need for additional repair and poor cosmetic result   Alternatives discussed:  No treatment Anesthesia (see MAR for exact dosages):    Anesthesia method:  None Laceration details:    Location:  Finger   Finger location:  L thumb   Length (cm):  1.5   Depth (mm):  1 (at 45 degrees angle) Repair type:    Repair type:  Simple Exploration:    Hemostasis obtained with: No bleeding at examination.   Wound exploration: wound explored through full range of motion     Wound extent: no foreign bodies/material noted, no muscle damage noted, no tendon damage noted and no underlying fracture noted     Contaminated: no   Treatment:    Area cleansed with:  Betadine   Amount of cleaning:  Standard   Visualized foreign bodies/material removed: no   Skin repair:    Repair method:  Steri-Strips   Number of Steri-Strips:  2 Post-procedure details:    Dressing:  Bulky dressing   Patient tolerance of procedure:  Tolerated well, no immediate complications     (including critical care time)  Labs Review Labs Reviewed - No data to display  Imaging Review    MDM   1. Laceration of left thumb without foreign body without damage to nail, initial encounter    1. Discussed with patient options of suture vs steristrips. Given laceration is at an angle, steristrips was chosen to approximate wound. Patient expressed understanding and agrees to plan. Wound wrapped with gauze to prevent bending of the left thumb and pulling of the wound. Wound care instructions given. Patient to monitor for increased redness, warmth, discharge.  2. Patient last Tdap in 2009. Given small/clean laceration, tdap booster not indicated. Patient expressed understanding.     Ok Edwards, PA-C 02/03/17 [place], Bud Kaeser V, PA-C 02/03/17 1635

## 2017-02-03 NOTE — ED Triage Notes (Signed)
Pt. Stated, cut of left thumb

## 2017-02-03 NOTE — ED Notes (Signed)
Wrong documentation Resp 16 not 1

## 2017-02-06 ENCOUNTER — Ambulatory Visit (INDEPENDENT_AMBULATORY_CARE_PROVIDER_SITE_OTHER): Payer: PPO | Admitting: Adult Health

## 2017-02-06 ENCOUNTER — Encounter: Payer: Self-pay | Admitting: Adult Health

## 2017-02-06 ENCOUNTER — Telehealth: Payer: Self-pay

## 2017-02-06 ENCOUNTER — Telehealth: Payer: Self-pay | Admitting: Cardiology

## 2017-02-06 ENCOUNTER — Ambulatory Visit: Payer: PPO

## 2017-02-06 ENCOUNTER — Other Ambulatory Visit: Payer: Self-pay | Admitting: Rheumatology

## 2017-02-06 VITALS — BP 121/72 | HR 83 | Temp 98.7°F | Ht 61.0 in | Wt 230.0 lb

## 2017-02-06 DIAGNOSIS — B029 Zoster without complications: Secondary | ICD-10-CM | POA: Diagnosis not present

## 2017-02-06 DIAGNOSIS — I5032 Chronic diastolic (congestive) heart failure: Secondary | ICD-10-CM

## 2017-02-06 DIAGNOSIS — R635 Abnormal weight gain: Secondary | ICD-10-CM | POA: Insufficient documentation

## 2017-02-06 DIAGNOSIS — I509 Heart failure, unspecified: Secondary | ICD-10-CM | POA: Diagnosis not present

## 2017-02-06 MED ORDER — FUROSEMIDE 20 MG PO TABS: 20.0000 mg | ORAL_TABLET | Freq: Two times a day (BID) | ORAL | 2 refills | Status: DC

## 2017-02-06 MED ORDER — FAMCICLOVIR 500 MG PO TABS: 500.0000 mg | ORAL_TABLET | Freq: Three times a day (TID) | ORAL | 0 refills | Status: DC

## 2017-02-06 NOTE — Assessment & Plan Note (Signed)
Famciclovir 500mg  TID x 7 days. Continue gabapentin 300mg  as directed. OTC Acetaminophen. F/u PRN.

## 2017-02-06 NOTE — Telephone Encounter (Signed)
-----   Message from Esaw Grandchild, NP sent at 02/06/2017  4:23 PM EDT ----- Please call Ms. Ator and share that her CXR: IMPRESSION: No acute cardiopulmonary disease. Mild thoracic aortic Atherosclerosis. Please continue with increase in Furosemide dosage and cards appt tomorrow. Thanks! Valetta Fuller

## 2017-02-06 NOTE — Patient Instructions (Addendum)
Shingles Shingles, which is also known as herpes zoster, is an infection that causes a painful skin rash and fluid-filled blisters. Shingles is not related to genital herpes, which is a sexually transmitted infection. Shingles only develops in people who:  Have had chickenpox.  Have received the chickenpox vaccine. (This is rare.)  What are the causes? Shingles is caused by varicella-zoster virus (VZV). This is the same virus that causes chickenpox. After exposure to VZV, the virus stays in the body in an inactive (dormant) state. Shingles develops if the virus reactivates. This can happen many years after the initial exposure to VZV. It is not known what causes this virus to reactivate. What increases the risk? People who have had chickenpox or received the chickenpox vaccine are at risk for shingles. Infection is more common in people who:  Are older than age 50.  Have a weakened defense (immune) system, such as those with HIV, AIDS, or cancer.  Are taking medicines that weaken the immune system, such as transplant medicines.  Are under great stress.  What are the signs or symptoms? Early symptoms of this condition include itching, tingling, and pain in an area on your skin. Pain may be described as burning, stabbing, or throbbing. A few days or weeks after symptoms start, a painful red rash appears, usually on one side of the body in a bandlike or beltlike pattern. The rash eventually turns into fluid-filled blisters that break open, scab over, and dry up in about 2-3 weeks. At any time during the infection, you may also develop:  A fever.  Chills.  A headache.  An upset stomach.  How is this diagnosed? This condition is diagnosed with a skin exam. Sometimes, skin or fluid samples are taken from the blisters before a diagnosis is made. These samples are examined under a microscope or sent to a lab for testing. How is this treated? There is no specific cure for this condition.  Your health care provider will probably prescribe medicines to help you manage pain, recover more quickly, and avoid long-term problems. Medicines may include:  Antiviral drugs.  Anti-inflammatory drugs.  Pain medicines.  If the area involved is on your face, you may be referred to a specialist, such as an eye doctor (ophthalmologist) or an ear, nose, and throat (ENT) doctor to help you avoid eye problems, chronic pain, or disability. Follow these instructions at home: Medicines  Take medicines only as directed by your health care provider.  Apply an anti-itch or numbing cream to the affected area as directed by your health care provider. Blister and Rash Care  Take a cool bath or apply cool compresses to the area of the rash or blisters as directed by your health care provider. This may help with pain and itching.  Keep your rash covered with a loose bandage (dressing). Wear loose-fitting clothing to help ease the pain of material rubbing against the rash.  Keep your rash and blisters clean with mild soap and cool water or as directed by your health care provider.  Check your rash every day for signs of infection. These include redness, swelling, and pain that lasts or increases.  Do not pick your blisters.  Do not scratch your rash. General instructions  Rest as directed by your health care provider.  Keep all follow-up visits as directed by your health care provider. This is important.  Until your blisters scab over, your infection can cause chickenpox in people who have never had it or been vaccinated   against it. To prevent this from happening, avoid contact with other people, especially: ? Babies. ? Pregnant women. ? Children who have eczema. ? Elderly people who have transplants. ? People who have chronic illnesses, such as leukemia or AIDS. Contact a health care provider if:  Your pain is not relieved with prescribed medicines.  Your pain does not get better after  the rash heals.  Your rash looks infected. Signs of infection include redness, swelling, and pain that lasts or increases. Get help right away if:  The rash is on your face or nose.  You have facial pain, pain around your eye area, or loss of feeling on one side of your face.  You have ear pain or you have ringing in your ear.  You have loss of taste.  Your condition gets worse. This information is not intended to replace advice given to you by your health care provider. Make sure you discuss any questions you have with your health care provider. Document Released: 07/31/2005 Document Revised: 03/26/2016 Document Reviewed: 06/11/2014 Elsevier Interactive Patient Education  2017 [place].  Famciclovir 500mg  three times day, for 7 days. Continue Gabapentin as directed. OTC Acetaminophen per manufacturer's instructions.    Heart Failure Heart failure is a condition in which the heart has trouble pumping blood because it has become weak or stiff. This means that the heart does not pump blood efficiently for the body to work well. For some people with heart failure, fluid may back up into the lungs and there may be swelling (edema) in the lower legs. Heart failure is usually a long-term (chronic) condition. It is important for you to take good care of yourself and follow the treatment plan from your health care provider. What are the causes? This condition is caused by some health problems, including:  High blood pressure (hypertension). Hypertension causes the heart muscle to work harder than normal. High blood pressure eventually causes the heart to become stiff and weak.  Coronary artery disease (CAD). CAD is the buildup of cholesterol and fat (plaques) in the arteries of the heart.  Heart attack (myocardial infarction). Injured tissue, which is caused by the heart attack, does not contract as well and the heart's ability to pump blood is weakened.  Abnormal heart valves. When  the heart valves do not open and close properly, the heart muscle must pump harder to keep the blood flowing.  Heart muscle disease (cardiomyopathy or myocarditis). Heart muscle disease is damage to the heart muscle from a variety of causes, such as drug or alcohol abuse, infections, or unknown causes. These can increase the risk of heart failure.  Lung disease. When the lungs do not work properly, the heart must work harder.  What increases the risk? Risk of heart failure increases as a person ages. This condition is also more likely to develop in people who:  Are overweight.  Are female.  Smoke or chew tobacco.  Abuse alcohol or illegal drugs.  Have taken medicines that can damage the heart, such as chemotherapy drugs.  Have diabetes. ? High blood sugar (glucose) is associated with high fat (lipid) levels in the blood. ? Diabetes can also damage tiny blood vessels that carry nutrients to the heart muscle.  Have abnormal heart rhythms.  Have thyroid problems.  Have low blood counts (anemia).  What are the signs or symptoms? Symptoms of this condition include:  Shortness of breath with activity, such as when climbing stairs.  Persistent cough.  Swelling of the feet,  ankles, legs, or abdomen.  Unexplained weight gain.  Difficulty breathing when lying flat (orthopnea).  Waking from sleep because of the need to sit up and get more air.  Rapid heartbeat.  Fatigue and loss of energy.  Feeling light-headed, dizzy, or close to fainting.  Loss of appetite.  Nausea.  Increased urination during the night (nocturia).  Confusion.  How is this diagnosed? This condition is diagnosed based on:  Medical history, symptoms, and a physical exam.  Diagnostic tests, which may include: ? Echocardiogram. ? Electrocardiogram (ECG). ? Chest X-ray. ? Blood tests. ? Exercise stress test. ? Radionuclide scans. ? Cardiac catheterization and angiogram.  How is this  treated? Treatment for this condition is aimed at managing the symptoms of heart failure. Medicines, behavioral changes, or other treatments may be necessary to treat heart failure. Medicines These may include:  Angiotensin-converting enzyme (ACE) inhibitors. This type of medicine blocks the effects of a blood protein called angiotensin-converting enzyme. ACE inhibitors relax (dilate) the blood vessels and help to lower blood pressure.  Angiotensin receptor blockers (ARBs). This type of medicine blocks the actions of a blood protein called angiotensin. ARBs dilate the blood vessels and help to lower blood pressure.  Water pills (diuretics). Diuretics cause the kidneys to remove salt and water from the blood. The extra fluid is removed through urination, leaving a lower volume of blood that the heart has to pump.  Beta blockers. These improve heart muscle strength and they prevent the heart from beating too quickly.  Digoxin. This increases the force of the heartbeat.  Healthy behavior changes These may include:  Reaching and maintaining a healthy weight.  Stopping smoking or chewing tobacco.  Eating heart-healthy foods.  Limiting or avoiding alcohol.  Stopping use of street drugs (illegal drugs).  Physical activity.  Other treatments These may include:  Surgery to open blocked coronary arteries or repair damaged heart valves.  Placement of a biventricular pacemaker to improve heart muscle function (cardiac resynchronization therapy). This device paces both the right ventricle and left ventricle.  Placement of a device to treat serious abnormal heart rhythms (implantable cardioverter defibrillator, or ICD).  Placement of a device to improve the pumping ability of the heart (left ventricular assist device, or LVAD).  Heart transplant. This can cure heart failure, and it is considered for certain patients who do not improve with other therapies.  Follow these instructions at  home: Medicines  Take over-the-counter and prescription medicines only as told by your health care provider. Medicines are important in reducing the workload of your heart, slowing the progression of heart failure, and improving your symptoms. ? Do not stop taking your medicine unless your health care provider told you to do that. ? Do not skip any dose of medicine. ? Refill your prescriptions before you run out of medicine. You need your medicines every day. Eating and drinking   Eat heart-healthy foods. Talk with a dietitian to make an eating plan that is right for you. ? Choose foods that contain no trans fat and are low in saturated fat and cholesterol. Healthy choices include fresh or frozen fruits and vegetables, fish, lean meats, legumes, fat-free or low-fat dairy products, and whole-grain or high-fiber foods. ? Limit salt (sodium) if directed by your health care provider. Sodium restriction may reduce symptoms of heart failure. Ask a dietitian to recommend heart-healthy seasonings. ? Use healthy cooking methods instead of frying. Healthy methods include roasting, grilling, broiling, baking, poaching, steaming, and stir-frying.  Limit your fluid  intake if directed by your health care provider. Fluid restriction may reduce symptoms of heart failure. Lifestyle  Stop smoking or using chewing tobacco. Nicotine and tobacco can damage your heart and your blood vessels. Do not use nicotine gum or patches before talking to your health care provider.  Limit alcohol intake to no more than 1 drink per day for non-pregnant women and 2 drinks per day for men. One drink equals 12 oz of beer, 5 oz of wine, or 1 oz of hard liquor. ? Drinking more than that is harmful to your heart. Tell your health care provider if you drink alcohol several times a week. ? Talk with your health care provider about whether any level of alcohol use is safe for you. ? If your heart has already been damaged by alcohol or  you have severe heart failure, drinking alcohol should be stopped completely.  Stop use of illegal drugs.  Lose weight if directed by your health care provider. Weight loss may reduce symptoms of heart failure.  Do moderate physical activity if directed by your health care provider. People who are elderly and people with severe heart failure should consult with a health care provider for physical activity recommendations. Monitor important information  Weigh yourself every day. Keeping track of your weight daily helps you to notice excess fluid sooner. ? Weigh yourself every morning after you urinate and before you eat breakfast. ? Wear the same amount of clothing each time you weigh yourself. ? Record your daily weight. Provide your health care provider with your weight record.  Monitor and record your blood pressure as told by your health care provider.  Check your pulse as told by your health care provider. Dealing with extreme temperatures  If the weather is extremely hot: ? Avoid vigorous physical activity. ? Use air conditioning or fans or seek a cooler location. ? Avoid caffeine and alcohol. ? Wear loose-fitting, lightweight, and light-colored clothing.  If the weather is extremely cold: ? Avoid vigorous physical activity. ? Layer your clothes. ? Wear mittens or gloves, a hat, and a scarf when you go outside. ? Avoid alcohol. General instructions  Manage other health conditions such as hypertension, diabetes, thyroid disease, or abnormal heart rhythms as told by your health care provider.  Learn to manage stress. If you need help to do this, ask your health care provider.  Plan rest periods when fatigued.  Get ongoing education and support as needed.  Participate in or seek rehabilitation as needed to maintain or improve independence and quality of life.  Stay up to date with immunizations. Keeping current on pneumococcal and influenza immunizations is especially  important to prevent respiratory infections.  Keep all follow-up visits as told by your health care provider. This is important. Contact a health care provider if:  You have a rapid weight gain.  You have increasing shortness of breath that is unusual for you.  You are unable to participate in your usual physical activities.  You tire easily.  You cough more than normal, especially with physical activity.  You have any swelling or more swelling in areas such as your hands, feet, ankles, or abdomen.  You are unable to sleep because it is hard to breathe.  You feel like your heart is beating quickly (palpitations).  You become dizzy or light-headed when you stand up. Get help right away if:  You have difficulty breathing.  You notice or your family notices a change in your awareness, such as having  trouble staying awake or having difficulty with concentration.  You have pain or discomfort in your chest.  You have an episode of fainting (syncope). This information is not intended to replace advice given to you by your health care provider. Make sure you discuss any questions you have with your health care provider. Document Released: 07/31/2005 Document Revised: 04/04/2016 Document Reviewed: 02/23/2016 Elsevier Interactive Patient Education  2017 [place].  Increase Furosemide 20mg  from once a day to twice a day. Cards appt scheduled for tomorrow. Will need follow-up for  BMP on Friday-either here or with cards. We will call when CXR and lab results are available. Please call clinic with any questions/concerns.

## 2017-02-06 NOTE — Progress Notes (Signed)
Subjective:    Patient ID: [name], female    DOB: 02/20/1951, 66 y.o.   MRN: [id number]  HPI:  [name] presents with "red rash and tingling" that developed on RLQ approx 24 hrs ago.  She has previous shingles outbreak in 2013 and "this feels exactly the same". She reports that the area is tender to the touch, rated at 4/10.  She has not taken anything to treat sx's.  Of Note:  Her wt today on clinic scale 230, wt on same clinic scale 217 at last OV 01/31/17- just 6 days ago.  She does not weight at home. She has hx of Diastolic HF and is followed by cards/Dr. Radford Pax, last OV 07/2017 She denies SOB, however she reports "felling puny" and "just off today".  She has taken all medication as directed and denies any change in diet/fluid intake, etc in last week. She denies this ever occurring before, esp the significant increase in wt (13lbs in last 6 days).    Review of Systems  Constitutional: Positive for fatigue and unexpected weight change. Negative for activity change, appetite change, chills, diaphoresis and fever.  Eyes: Negative for visual disturbance.  Respiratory: Negative for cough, chest tightness, shortness of breath, wheezing and stridor.   Cardiovascular: Positive for leg swelling. Negative for chest pain and palpitations.  Gastrointestinal: Negative for abdominal distention, abdominal pain, blood in stool, constipation, diarrhea, nausea and vomiting.  Endocrine: Negative for cold intolerance, heat intolerance, polydipsia, polyphagia and polyuria.  Genitourinary: Negative for difficulty urinating, flank pain and hematuria.  Musculoskeletal: Positive for arthralgias, back pain, gait problem, joint swelling and myalgias.  Skin: Negative for color change, pallor, rash and wound.  Neurological: Negative for dizziness and headaches.  Hematological: Does not bruise/bleed easily.  Psychiatric/Behavioral: The patient is not nervous/anxious.        Objective:   Physical Exam    Constitutional: She is oriented to person, place, and time. She appears well-developed and well-nourished. No distress.  HENT:  Head: Normocephalic and atraumatic.  Right Ear: External ear normal.  Left Ear: External ear normal.  Eyes: Conjunctivae are normal. Pupils are equal, round, and reactive to light.  Neck: Normal range of motion. Neck supple.  Cardiovascular: Normal rate, regular rhythm, normal heart sounds and intact distal pulses.   No murmur heard. Pulmonary/Chest: Effort normal and breath sounds normal. No respiratory distress. She has no wheezes. She has no rales. She exhibits no tenderness.  Musculoskeletal: She exhibits edema, tenderness and deformity.       Right ankle: She exhibits swelling.       Left ankle: She exhibits swelling.  R ankle edema<L ankle edema  Lymphadenopathy:    She has no cervical adenopathy.  Neurological: She is alert and oriented to person, place, and time.  Skin: Skin is warm and dry. Rash noted. She is not diaphoretic. There is erythema. No pallor.     Herpes zoster outbreak. No open tissue/drainage/streaking/excessive warmth noted.   Psychiatric: She has a normal mood and affect. Her behavior is normal. Judgment and thought content normal.  Nursing note and vitals reviewed.         Assessment & Plan:   1. Abnormal weight gain   2. Chronic diastolic CHF (congestive heart failure) (HCC)   3. Herpes zoster without complication     Chronic diastolic CHF (congestive heart failure) ([place]) CXR-normal-pt called/updated. Increased Furosemide 20mg  from daily to BID. Instructed to RTC Friday to check BMP. She has appt with cards/Dr.  Turner this week.   Shingles outbreak Famciclovir 500mg  TID x 7 days. Continue gabapentin 300mg  as directed. OTC Acetaminophen. F/u PRN.    FOLLOW-UP:  Return in about 3 days (around 02/09/2017) for Unusual Wt Gain.

## 2017-02-06 NOTE — Telephone Encounter (Signed)
New Message     Please follow up with patient , doctors office called in to schedule appt for 02/07/17 with Cecilie Kicks 10a due to pt having swelling.  They are not requesting a call back , but the patient has had a 15lb wt gain in 3 days.

## 2017-02-06 NOTE — Telephone Encounter (Signed)
Informed patient of xray results and appointment information.

## 2017-02-06 NOTE — Telephone Encounter (Signed)
10/13/16 last visit  03/15/17 next visit  Labs: 10/13/16 WNL  Okay to refill Arava per Dr. Estanislado Pandy.  Patient updated labs with PCP today.

## 2017-02-06 NOTE — Telephone Encounter (Signed)
Left message for patient to call back. Reviewed patient's office visit today with Mina Marble NP at her PCP's office. Patient has gained 13 lbs in 6 days. Patient's PCP increase patient's Lasix to 20 mg by mouth twice daily. Patient is suppose to have BMET on Friday or sooner if ordered by Cecilie Kicks NP at office visit on Thursday. Will see how patient is doing when she returns our call.

## 2017-02-06 NOTE — Assessment & Plan Note (Signed)
CXR-normal-pt called/updated. Increased Furosemide 20mg  from daily to BID. Instructed to RTC Friday to check BMP. She has appt with cards/Dr. Radford Pax this week.

## 2017-02-07 ENCOUNTER — Telehealth: Payer: Self-pay

## 2017-02-07 LAB — CBC WITH DIFFERENTIAL/PLATELET
BASOS: 1 %
Basophils Absolute: 0 10*3/uL (ref 0.0–0.2)
EOS (ABSOLUTE): 0.1 10*3/uL (ref 0.0–0.4)
Eos: 2 %
HEMATOCRIT: 42.2 % (ref 34.0–46.6)
Hemoglobin: 13.3 g/dL (ref 11.1–15.9)
IMMATURE GRANULOCYTES: 0 %
Immature Grans (Abs): 0 10*3/uL (ref 0.0–0.1)
LYMPHS ABS: 1.5 10*3/uL (ref 0.7–3.1)
Lymphs: 28 %
MCH: 29.6 pg (ref 26.6–33.0)
MCHC: 31.5 g/dL (ref 31.5–35.7)
MCV: 94 fL (ref 79–97)
MONOS ABS: 0.5 10*3/uL (ref 0.1–0.9)
Monocytes: 10 %
NEUTROS ABS: 3.1 10*3/uL (ref 1.4–7.0)
NEUTROS PCT: 59 %
PLATELETS: 185 10*3/uL (ref 150–379)
RBC: 4.49 x10E6/uL (ref 3.77–5.28)
RDW: 13.7 % (ref 12.3–15.4)
WBC: 5.2 10*3/uL (ref 3.4–10.8)

## 2017-02-07 LAB — COMPREHENSIVE METABOLIC PANEL
A/G RATIO: 2 (ref 1.2–2.2)
ALT: 15 IU/L (ref 0–32)
AST: 21 IU/L (ref 0–40)
Albumin: 4.3 g/dL (ref 3.6–4.8)
Alkaline Phosphatase: 98 IU/L (ref 39–117)
BILIRUBIN TOTAL: 0.2 mg/dL (ref 0.0–1.2)
BUN/Creatinine Ratio: 24 (ref 12–28)
BUN: 13 mg/dL (ref 8–27)
CHLORIDE: 103 mmol/L (ref 96–106)
CO2: 23 mmol/L (ref 20–29)
Calcium: 9.4 mg/dL (ref 8.7–10.3)
Creatinine, Ser: 0.54 mg/dL — ABNORMAL LOW (ref 0.57–1.00)
GFR calc Af Amer: 115 mL/min/{1.73_m2} (ref >59)
GFR calc non Af Amer: 100 mL/min/{1.73_m2} (ref >59)
GLOBULIN, TOTAL: 2.2 g/dL (ref 1.5–4.5)
Glucose: 128 mg/dL — ABNORMAL HIGH (ref 65–99)
POTASSIUM: 4.2 mmol/L (ref 3.5–5.2)
SODIUM: 145 mmol/L — AB (ref 134–144)
Total Protein: 6.5 g/dL (ref 6.0–8.5)

## 2017-02-07 NOTE — Telephone Encounter (Signed)
Patient aware of lab results.

## 2017-02-07 NOTE — Progress Notes (Signed)
Cardiology Office Note   Date:  02/08/2017   ID:  [name], DOB March 10, 1951, MRN [id number]  PCP:  Mellody Dance, DO  Cardiologist:  Dr. Radford Pax    Chief Complaint  Patient presents with  . Edema      History of Present Illness: [name] is a 66 y.o. female who presents for edema, 13-15 lb wt gain in 3 days.    Pt with hx of PVC's, chronic diastolic HF, obesity, OSA on Cpap.  Last seem by Dr.Turner 07/2016.  Echo in 2015 with EF 45-50%,  No RWMA, G1DD. Trivial pulmonic regurg.   Today her wt is down 5 lbs after being her lasix has been increased to 20 mg BID.  She suddenly gained the weight, did not change diet, or increase salt, no new meds.  She was not aware of swelling but found wt to be up at PCP office..  She as no SOB or chest pain.   Sleeping on same amount of pillows.  She thinks her PVCs have increased but not significantly.   She is stressed about the fluid, also feels the fluid is in her abd as well. She also has shingles.  Past Medical History:  Diagnosis Date  . Arthritis    Rheumatoid arthritis,   . Blood transfusion    1981  . Chronic diastolic CHF (congestive heart failure) ([place])   . Fibromyalgia   . Heart murmur    as a child  . Hiatal hernia    sjorgens syndrome  . OSA (obstructive sleep apnea)   . Peripheral autonomic neuropathy of unknown cause     Past Surgical History:  Procedure Laterality Date  . ABDOMINAL HYSTERECTOMY     BTL, BSO  . CHOLECYSTECTOMY    . DIAGNOSTIC LAPAROSCOPY     x3  . KNEE ARTHROSCOPY     x2  . patotid cystectomy    . TENDON REPAIR  1980   left ankle and tibia  . TONSILLECTOMY    . TOTAL KNEE ARTHROPLASTY  06/21/2011   Procedure: TOTAL KNEE ARTHROPLASTY;  Surgeon: Gearlean Alf;  Location: [facility];  Service: Orthopedics;  Laterality: Right;  . TUBAL LIGATION       Current Outpatient Prescriptions  Medication Sig Dispense Refill  . carbamazepine (TEGRETOL) 200 MG tablet 1/2 tablet in the morning and  1 tablet in the evening 135 tablet 3  . Cholecalciferol (VITAMIN D3) 5000 units TABS Take by mouth daily.    . Cholesterol POWD Take by mouth as needed.     . cycloSPORINE (RESTASIS) 0.05 % ophthalmic emulsion Place 2 drops into both eyes 2 (two) times daily.     . diphenoxylate-atropine (LOMOTIL) 2.5-0.025 MG per tablet Take 1 tablet by mouth 4 (four) times daily as needed for diarrhea or loose stools.    . famciclovir (FAMVIR) 500 MG tablet Take 1 tablet (500 mg total) by mouth 3 (three) times daily. 21 tablet 0  . furosemide (LASIX) 20 MG tablet Take 1 tablet (20 mg total) by mouth 2 (two) times daily. 90 tablet 2  . gabapentin (NEURONTIN) 300 MG capsule Take 300 mg by mouth 2 (two) times daily. Take 2 capsules in the morning and 3 capsules at bedtime    . HYDROcodone-acetaminophen (NORCO/VICODIN) 5-325 MG tablet Take 1 tablet by mouth at bedtime as needed. 30 tablet 0  . hydroxychloroquine (PLAQUENIL) 200 MG tablet TAKE 1 TABLET BY MOUTH EVERY MORNING AND 1/2 TABLET AT BEDTIME. 135 tablet 1  .  hyoscyamine (LEVBID) 0.375 MG 12 hr tablet Take 0.375 mg by mouth as needed.     . leflunomide (ARAVA) 20 MG tablet TAKE 1 TABLET (20 MG TOTAL) BY MOUTH DAILY. 90 tablet 0  . metoprolol succinate (TOPROL-XL) 25 MG 24 hr tablet TAKE 1 TABLET BY MOUTH DAILY. 90 tablet 2  . pantoprazole (PROTONIX) 40 MG tablet Take 40 mg by mouth daily.    . potassium chloride (K-DUR) 10 MEQ tablet TAKE 1 TABLET BY MOUTH DAILY 90 tablet 0  . Probiotic Product ([place]) 170 MG CAPS Take 1 capsule by mouth every morning.    . promethazine (PHENERGAN) 25 MG tablet TAKE 1 TABLET BY MOUTH EVERY 6 HOURS AS NEEDED. 120 tablet 2  . silver sulfADIAZINE (SILVADENE) 1 % cream Apply 1 application topically as needed.    . sulfaSALAzine (AZULFIDINE) 500 MG EC tablet TAKE 2 TABLETS BY MOUTH 2 TIMES A DAY. 360 tablet 0  . tiZANidine (ZANAFLEX) 4 MG tablet TAKE 1 TABLET BY MOUTH DAILY AT BEDTIME 90 tablet 1  .  triamcinolone ointment (KENALOG) 0.1 % To affected areas of itching Three times daily 80 g 1   No current facility-administered medications for this visit.    Facility-Administered Medications Ordered in Other Visits  Medication Dose Route Frequency Provider Last Rate Last Dose  . chlorhexidine (HIBICLENS) 4 % liquid 4 application  60 mL Topical Once Aluisio, Pilar Plate, MD      . chlorhexidine (HIBICLENS) 4 % liquid 4 application  60 mL Topical Once Gaynelle Arabian, MD        Allergies:   Cymbalta [duloxetine hcl]; Demerol; Ivp dye [iodinated diagnostic agents]; Morphine and related; and Adhesive [tape]    Social History:  The patient  reports that she has never smoked. She has never used smokeless tobacco. She reports that she drinks alcohol. She reports that she does not use drugs.   Family History:  The patient's family history includes Alzheimer's disease in her mother; Cancer in her father; Glaucoma in her brother, brother, and mother; Heart attack in her brother and mother; Heart disease in her brother; Hyperlipidemia in her brother; Hypertension in her mother.    ROS:  General:no colds or fevers, weight up 13 lbs and now back down 5 lbs  Skin:no rashes or ulcers--+ shingles HEENT:no blurred vision, no congestion CV:see HPI PUL:see HPI GI:no diarrhea constipation or melena, no indigestion GU:no hematuria, no dysuria MS:+RA pain, no claudication Neuro:no syncope, no lightheadedness Endo:no diabetes, no thyroid disease  Wt Readings from Last 3 Encounters:  02/08/17 225 lb (102.1 kg)  02/06/17 230 lb (104.3 kg)  02/03/17 217 lb (98.4 kg)     PHYSICAL EXAM: VS:  BP 120/80   Pulse 85   Ht 5\' 1"  (1.549 m)   Wt 225 lb (102.1 kg)   SpO2 93%   BMI 42.51 kg/m  , BMI Body mass index is 42.51 kg/m. General:Pleasant affect, NAD Skin:Warm and dry, brisk capillary refill HEENT:normocephalic, sclera clear, mucus membranes moist Neck:supple, no JVD, no bruits  Heart:S1S2 RRR without  murmur, gallup, rub or click Lungs:clear without rales, rhonchi, or wheezes QVZ:DGLO, non tender, + BS, do not palpate liver spleen or masses Ext:no lower ext edema, 2+ pedal pulses, 2+ radial pulses Neuro:alert and oriented X 3, MAE, follows commands, + facial symmetry    EKG:  EKG is ordered today. The ekg ordered today demonstrates SR and incomplete RBBB   Recent Labs: 05/23/2016: TSH 0.90 02/06/2017: ALT 15; BUN 13; Creatinine,  Ser 0.54; Hemoglobin 13.3; Platelets 185; Potassium 4.2; Sodium 145    Lipid Panel    Component Value Date/Time   CHOL 201 (H) 08/11/2016 1017   TRIG 293 (H) 08/11/2016 1017   HDL 43 (L) 08/11/2016 1017   CHOLHDL 4.7 08/11/2016 1017   VLDL 59 (H) 08/11/2016 1017   LDLCALC 99 08/11/2016 1017       Other studies Reviewed: Additional studies/ records that were reviewed today include:  Echo 08/12/14  Study Conclusions  - Left ventricle: The cavity size was normal. There was mild concentric hypertrophy. Systolic function was mildly reduced. The estimated ejection fraction was in the range of 45% to 50%. Wall motion was normal; there were no regional wall motion abnormalities. There was an increased relative contribution of atrial contraction to ventricular filling. Doppler parameters are consistent with abnormal left ventricular relaxation (grade 1 diastolic dysfunction). - Ventricular septum: Septal motion showed paradox. - Pulmonic valve: There was trivial regurgitation.  ASSESSMENT AND PLAN:  1.  Leg edema - wt is down 5 lbs since increase of lasix.  Will have her take 40 in AM and check BMP today.  If no improvement she may need higher dose or metolazone.   Will check Echo to eval LV function.      2. Chronic diastolic HF.  Will recheck echo with leg edema  3. PCVs still with occ, check Echo to see if has caused decrease in EF.  4.  Morbid obesity, with her RA exercise is difficult. She does not eat salt, but does need  diet .  Will discuss more once edema resolved.  5. OSA continue CPAP.  Current medicines are reviewed with the patient today.  The patient Has no concerns regarding medicines.  The following changes have been made:  See above Labs/ tests ordered today include:see above  Disposition:   FU:  see above  Signed, Cecilie Kicks, NP  02/08/2017 10:35 AM    [address], [place], [place], [place] Phone: [phone]; Fax: [phone]

## 2017-02-07 NOTE — Telephone Encounter (Signed)
-----   Message from Esaw Grandchild, NP sent at 02/07/2017  8:22 AM EDT ----- Please call Ms. Eulas Post and share that her CBC and CMP are both stable- she is a retired Biomedical scientist, so she has a strong understanding of the labs, CXR, measure we took yesterday. Thanks! Valetta Fuller

## 2017-02-08 ENCOUNTER — Ambulatory Visit (INDEPENDENT_AMBULATORY_CARE_PROVIDER_SITE_OTHER): Payer: PPO | Admitting: Cardiology

## 2017-02-08 ENCOUNTER — Encounter: Payer: Self-pay | Admitting: Cardiology

## 2017-02-08 VITALS — BP 120/80 | HR 85 | Ht 61.0 in | Wt 225.0 lb

## 2017-02-08 DIAGNOSIS — R0602 Shortness of breath: Secondary | ICD-10-CM

## 2017-02-08 DIAGNOSIS — R609 Edema, unspecified: Secondary | ICD-10-CM

## 2017-02-08 DIAGNOSIS — G4733 Obstructive sleep apnea (adult) (pediatric): Secondary | ICD-10-CM

## 2017-02-08 DIAGNOSIS — I5032 Chronic diastolic (congestive) heart failure: Secondary | ICD-10-CM | POA: Diagnosis not present

## 2017-02-08 DIAGNOSIS — R635 Abnormal weight gain: Secondary | ICD-10-CM

## 2017-02-08 LAB — BASIC METABOLIC PANEL
BUN / CREAT RATIO: 18 (ref 12–28)
BUN: 12 mg/dL (ref 8–27)
CHLORIDE: 101 mmol/L (ref 96–106)
CO2: 24 mmol/L (ref 20–29)
Calcium: 9.5 mg/dL (ref 8.7–10.3)
Creatinine, Ser: 0.66 mg/dL (ref 0.57–1.00)
GFR calc Af Amer: 108 mL/min/{1.73_m2} (ref >59)
GFR calc non Af Amer: 94 mL/min/{1.73_m2} (ref >59)
GLUCOSE: 93 mg/dL (ref 65–99)
Potassium: 4 mmol/L (ref 3.5–5.2)
SODIUM: 140 mmol/L (ref 134–144)

## 2017-02-08 MED ORDER — FUROSEMIDE 20 MG PO TABS: 40.0000 mg | ORAL_TABLET | Freq: Every day | ORAL | 3 refills | Status: DC

## 2017-02-08 NOTE — Patient Instructions (Signed)
Medication Instructions:  1. CHANGE LASIX TO 40 MG EVERY MORNING, (THIS WILL BE 2 OF THE 20 MG TABLETS EVERY MORNING)  2. OK TO TAKE OTC CLARITIN OR [name] FOR ALLERGIES, MAKE SURE TO TAKE JUST THE PLAIN VERSION OF MEDICATION AND THAT THEY DO NOT HAVE THE D ON THE BOTTLE   Labwork: TODAY BMET  Testing/Procedures: Your physician has requested that you have an echocardiogram. Echocardiography is a painless test that uses sound waves to create images of your heart. It provides your doctor with information about the size and shape of your heart and how well your heart's chambers and valves are working. This procedure takes approximately one hour. There are no restrictions for this procedure. NOT AN URGENT ECHO,  TO BE DONE IF POSSIBLE WITHIN THE NEXT 2 WEEKS    Follow-Up: 2-3 WEEKS WITH Cecilie Kicks, NP  Any Other Special Instructions Will Be Listed Below (If Applicable).     If you need a refill on your cardiac medications before your next appointment, please call your pharmacy.

## 2017-02-08 NOTE — Telephone Encounter (Signed)
Pt seen in office today by Cecilie Kicks, NP.  Please see that note for further information.

## 2017-02-09 ENCOUNTER — Ambulatory Visit: Payer: PPO | Admitting: Family Medicine

## 2017-02-15 ENCOUNTER — Other Ambulatory Visit: Payer: Self-pay | Admitting: Rheumatology

## 2017-02-15 ENCOUNTER — Telehealth: Payer: Self-pay | Admitting: Cardiology

## 2017-02-15 DIAGNOSIS — R269 Unspecified abnormalities of gait and mobility: Secondary | ICD-10-CM | POA: Diagnosis not present

## 2017-02-15 DIAGNOSIS — G4733 Obstructive sleep apnea (adult) (pediatric): Secondary | ICD-10-CM | POA: Diagnosis not present

## 2017-02-15 MED ORDER — HYDROCODONE-ACETAMINOPHEN 5-325 MG PO TABS: 1.0000 | ORAL_TABLET | Freq: Every evening | ORAL | 0 refills | Status: DC | PRN

## 2017-02-15 NOTE — Telephone Encounter (Signed)
Returned pts call.  She advises that she has increased her Lasix 40mg  daily and has been watching her fluid/sodium intake and she has gained 3 lbs in 24 hours. She states her weight this am is 228 Pt advised I would send Cecilie Kicks, NP, a message and get her recommendations and call her back. Pt verbalized understanding.  Please advise!

## 2017-02-15 NOTE — Telephone Encounter (Signed)
New message    Pt was instructed to call and notify us of her weight gain and she states that she has gained 3lbs in 1 day. She wants medications to be added to help her.

## 2017-02-15 NOTE — Telephone Encounter (Signed)
Patient requesting a refill on Hydrocodone. Patient has had labs just last week with Cardiologist, and PCP whom are both with [facility]. Patient would like to pick up rx tomorrow, 6th if possible. Please call if any questions, or if patient can not pick up rx Friday the 6th.

## 2017-02-15 NOTE — Telephone Encounter (Signed)
Patient advised prescription ready for pick up. Patient advised will need to update narcotic agreement and UDS

## 2017-02-15 NOTE — Telephone Encounter (Signed)
10/13/16 last visit  03/15/17 next visit  UDS:06/27/16 Narc Agreement: 02/29/16  Okay to refill Hydrocodone?

## 2017-02-15 NOTE — Telephone Encounter (Signed)
Patient returned your call.

## 2017-02-16 ENCOUNTER — Ambulatory Visit: Payer: PPO | Admitting: Family Medicine

## 2017-02-16 ENCOUNTER — Ambulatory Visit (HOSPITAL_COMMUNITY): Payer: PPO | Attending: Cardiovascular Disease

## 2017-02-16 ENCOUNTER — Telehealth: Payer: Self-pay | Admitting: *Deleted

## 2017-02-16 ENCOUNTER — Other Ambulatory Visit: Payer: Self-pay | Admitting: *Deleted

## 2017-02-16 ENCOUNTER — Other Ambulatory Visit: Payer: Self-pay

## 2017-02-16 DIAGNOSIS — Z5181 Encounter for therapeutic drug level monitoring: Secondary | ICD-10-CM

## 2017-02-16 DIAGNOSIS — R0602 Shortness of breath: Secondary | ICD-10-CM | POA: Diagnosis not present

## 2017-02-16 DIAGNOSIS — I5032 Chronic diastolic (congestive) heart failure: Secondary | ICD-10-CM | POA: Diagnosis not present

## 2017-02-16 DIAGNOSIS — R011 Cardiac murmur, unspecified: Secondary | ICD-10-CM | POA: Diagnosis not present

## 2017-02-16 DIAGNOSIS — G4733 Obstructive sleep apnea (adult) (pediatric): Secondary | ICD-10-CM | POA: Insufficient documentation

## 2017-02-16 DIAGNOSIS — R609 Edema, unspecified: Secondary | ICD-10-CM

## 2017-02-16 DIAGNOSIS — M797 Fibromyalgia: Secondary | ICD-10-CM | POA: Insufficient documentation

## 2017-02-16 DIAGNOSIS — Z79899 Other long term (current) drug therapy: Secondary | ICD-10-CM

## 2017-02-16 MED ORDER — METOLAZONE 2.5 MG PO TABS: ORAL_TABLET | ORAL | 0 refills | Status: DC

## 2017-02-16 NOTE — Telephone Encounter (Signed)
Pt asked to speak to me after her echo was done.  She has been advised to start Metolazone 2.5 mg daily 30 mins prior to taking Lasix, for 4 days, then drop the Metolazone to qod.  Pt also advised to get her some compression stockings.  Pt verbalized understanding.

## 2017-02-16 NOTE — Telephone Encounter (Signed)
-----   Message from Isaiah Serge, NP sent at 02/16/2017  3:20 PM EDT ----- Echo looks good, normal pump action and very mild LV stiffness.  BMP on Monday.

## 2017-02-16 NOTE — Telephone Encounter (Signed)
Add metolazone 2.5 mg daily 30 min prior to lasix for 4 days then go to every other day for metolazone and daily for lasix.  She needs support stocking the ones at drug store are good for now.  Continue jwith daily weights.

## 2017-02-17 LAB — PAIN MGMT, PROFILE 5 W/CONF, U
Amphetamines: NEGATIVE ng/mL (ref <500)
Barbiturates: NEGATIVE ng/mL (ref <300)
Benzodiazepines: NEGATIVE ng/mL (ref <100)
CREATININE: 47.9 mg/dL (ref >=20.0)
Cocaine Metabolite: NEGATIVE ng/mL (ref <150)
MARIJUANA METABOLITE: NEGATIVE ng/mL (ref <20)
Methadone Metabolite: NEGATIVE ng/mL (ref <100)
OXYCODONE: NEGATIVE ng/mL (ref <100)
Opiates: NEGATIVE ng/mL (ref <100)
Oxidant: NEGATIVE ug/mL (ref <200)
PH: 5.68 (ref 4.5–9.0)

## 2017-02-19 ENCOUNTER — Other Ambulatory Visit: Payer: PPO

## 2017-02-19 ENCOUNTER — Telehealth: Payer: Self-pay | Admitting: Cardiology

## 2017-02-19 NOTE — Telephone Encounter (Signed)
New message    Pt is calling. She states that she was supposed to start a dieretic Friday, but she had to go out of town and is going to start it today. She wants to move lab appt to Thursday when she is here to see Mickel Baas.

## 2017-02-19 NOTE — Progress Notes (Signed)
Labs are consistent with treatment

## 2017-02-19 NOTE — Telephone Encounter (Signed)
Pt called and advised me that she wasn't able to start the Metolazone until Today, due to a family emergency.  She already has a f/u with you on Thursday, so she will just repeat bmet at that time.

## 2017-02-21 ENCOUNTER — Ambulatory Visit: Payer: PPO | Admitting: Family Medicine

## 2017-02-21 DIAGNOSIS — G4733 Obstructive sleep apnea (adult) (pediatric): Secondary | ICD-10-CM | POA: Diagnosis not present

## 2017-02-21 DIAGNOSIS — R269 Unspecified abnormalities of gait and mobility: Secondary | ICD-10-CM | POA: Diagnosis not present

## 2017-02-21 NOTE — Progress Notes (Signed)
11

## 2017-02-22 ENCOUNTER — Ambulatory Visit (INDEPENDENT_AMBULATORY_CARE_PROVIDER_SITE_OTHER): Payer: PPO | Admitting: Cardiology

## 2017-02-22 ENCOUNTER — Encounter: Payer: Self-pay | Admitting: Cardiology

## 2017-02-22 ENCOUNTER — Other Ambulatory Visit: Payer: PPO | Admitting: *Deleted

## 2017-02-22 VITALS — BP 124/76 | HR 80 | Ht 61.0 in | Wt 224.0 lb

## 2017-02-22 DIAGNOSIS — I5032 Chronic diastolic (congestive) heart failure: Secondary | ICD-10-CM

## 2017-02-22 DIAGNOSIS — Z79899 Other long term (current) drug therapy: Secondary | ICD-10-CM

## 2017-02-22 DIAGNOSIS — I493 Ventricular premature depolarization: Secondary | ICD-10-CM

## 2017-02-22 DIAGNOSIS — R609 Edema, unspecified: Secondary | ICD-10-CM | POA: Diagnosis not present

## 2017-02-22 DIAGNOSIS — R635 Abnormal weight gain: Secondary | ICD-10-CM

## 2017-02-22 NOTE — Patient Instructions (Signed)
Medication Instructions:   Your physician recommends that you continue on your current medications as directed. Please refer to the Current Medication list given to you today.   If you need a refill on your cardiac medications before your next appointment, please call your pharmacy.  Labwork:  BMET TODAY     Testing/Procedures: NONE ORDERED  TODAY    Follow-Up: IN 2 MONTHS WITH DR TURNER    Any Other Special Instructions Will Be Listed Below (If Applicable).

## 2017-02-22 NOTE — Progress Notes (Signed)
Cardiology Office Note   Date:  02/22/2017   ID:  [name], DOB Sep 13, 1950, MRN [id number]  PCP:  Mellody Dance, DO  Cardiologist:  Dr. Radford Pax    Chief Complaint  Patient presents with  . Leg Swelling      History of Present Illness: [name] is a 66 y.o. female who presents for lower ext edema follow up.  Edema did not improve with increased lasix so metolazone was started.   Pt with hx of PVC's, chronic diastolic HF, obesity, OSA on Cpap. Last seem by Dr.Turner 07/2016. Echo in 2015 with EF 45-50%, No RWMA, G1DD. Trivial pulmonic regurg   I ordered a Echo which revealed EF 60-65% with moderate LVH, G1DD, MV mildly calcified annulus.  EF had improved.from 45-50%.     Today more frustrated that she has not lost more weight.  She is voiding more with the metolazone.  She cannot exercise due to RA --BMI is 42 - she has done [employer] in the past but nothing recently.  We discussed different groups to lose wt- [employer], [facility] and [facility] [employer] [facility] wt loss. She should look at programs and decide.   No chest pain and no SOB.  We reviewed her echo which is improved.    Past Medical History:  Diagnosis Date  . Arthritis    Rheumatoid arthritis,   . Blood transfusion    1981  . Chronic diastolic CHF (congestive heart failure) ([place])   . Fibromyalgia   . Heart murmur    as a child  . Hiatal hernia    sjorgens syndrome  . OSA (obstructive sleep apnea)   . Peripheral autonomic neuropathy of unknown cause     Past Surgical History:  Procedure Laterality Date  . ABDOMINAL HYSTERECTOMY     BTL, BSO  . CHOLECYSTECTOMY    . DIAGNOSTIC LAPAROSCOPY     x3  . KNEE ARTHROSCOPY     x2  . patotid cystectomy    . TENDON REPAIR  1980   left ankle and tibia  . TONSILLECTOMY    . TOTAL KNEE ARTHROPLASTY  06/21/2011   Procedure: TOTAL KNEE ARTHROPLASTY;  Surgeon: Gearlean Alf;  Location: [facility];  Service: Orthopedics;  Laterality: Right;  . TUBAL LIGATION       Current  Outpatient Prescriptions  Medication Sig Dispense Refill  . carbamazepine (TEGRETOL) 200 MG tablet 1/2 tablet in the morning and 1 tablet in the evening 135 tablet 3  . Cholecalciferol (VITAMIN D3) 5000 units TABS Take by mouth daily.    . Cholesterol POWD Take by mouth as needed.     . cycloSPORINE (RESTASIS) 0.05 % ophthalmic emulsion Place 2 drops into both eyes 2 (two) times daily.     . diphenoxylate-atropine (LOMOTIL) 2.5-0.025 MG per tablet Take 1 tablet by mouth 4 (four) times daily as needed for diarrhea or loose stools.    . famciclovir (FAMVIR) 500 MG tablet Take 1 tablet (500 mg total) by mouth 3 (three) times daily. 21 tablet 0  . furosemide (LASIX) 20 MG tablet Take 2 tablets (40 mg total) by mouth daily. 180 tablet 3  . gabapentin (NEURONTIN) 300 MG capsule Take 300 mg by mouth 2 (two) times daily. Take 2 capsules in the morning and 3 capsules at bedtime    . HYDROcodone-acetaminophen (NORCO/VICODIN) 5-325 MG tablet Take 1 tablet by mouth at bedtime as needed. 30 tablet 0  . hydroxychloroquine (PLAQUENIL) 200 MG tablet TAKE 1 TABLET BY MOUTH  EVERY MORNING AND 1/2 TABLET AT BEDTIME. 135 tablet 1  . hyoscyamine (LEVBID) 0.375 MG 12 hr tablet Take 0.375 mg by mouth as needed.     . leflunomide (ARAVA) 20 MG tablet TAKE 1 TABLET (20 MG TOTAL) BY MOUTH DAILY. 90 tablet 0  . metolazone (ZAROXOLYN) 2.5 MG tablet TAKE 1 TABLET BY MOUTH 30 MINS BEFORE LASIX FOR 4 DAYS THEN GO TO TAKING 1 TABLET BY MOUTH 3O MINS BEFORE LASIX EVERY OTHER DAY 40 tablet 0  . metoprolol succinate (TOPROL-XL) 25 MG 24 hr tablet TAKE 1 TABLET BY MOUTH DAILY. 90 tablet 2  . pantoprazole (PROTONIX) 40 MG tablet Take 40 mg by mouth daily.    . potassium chloride (K-DUR) 10 MEQ tablet TAKE 1 TABLET BY MOUTH DAILY 90 tablet 0  . Probiotic Product ([place]) 170 MG CAPS Take 1 capsule by mouth every morning.    . promethazine (PHENERGAN) 25 MG tablet TAKE 1 TABLET BY MOUTH EVERY 6 HOURS AS NEEDED. 120  tablet 2  . silver sulfADIAZINE (SILVADENE) 1 % cream Apply 1 application topically as needed.    . sulfaSALAzine (AZULFIDINE) 500 MG EC tablet TAKE 2 TABLETS BY MOUTH 2 TIMES A DAY. 360 tablet 0  . tiZANidine (ZANAFLEX) 4 MG tablet TAKE 1 TABLET BY MOUTH DAILY AT BEDTIME 90 tablet 1  . triamcinolone ointment (KENALOG) 0.1 % To affected areas of itching Three times daily 80 g 1   No current facility-administered medications for this visit.    Facility-Administered Medications Ordered in Other Visits  Medication Dose Route Frequency Provider Last Rate Last Dose  . chlorhexidine (HIBICLENS) 4 % liquid 4 application  60 mL Topical Once Aluisio, Pilar Plate, MD      . chlorhexidine (HIBICLENS) 4 % liquid 4 application  60 mL Topical Once Gaynelle Arabian, MD        Allergies:   Cymbalta [duloxetine hcl]; Demerol; Ivp dye [iodinated diagnostic agents]; Morphine and related; and Adhesive [tape]    Social History:  The patient  reports that she has never smoked. She has never used smokeless tobacco. She reports that she drinks alcohol. She reports that she does not use drugs.   Family History:  The patient's family history includes Alzheimer's disease in her mother; Cancer in her father; Glaucoma in her brother, brother, and mother; Heart attack in her brother and mother; Heart disease in her brother; Hyperlipidemia in her brother; Hypertension in her mother.    ROS:  General:no colds or fevers, mild weight changes down Skin:no rashes or ulcers HEENT:no blurred vision, no congestion CV:see HPI PUL:see HPI GI:no diarrhea constipation or melena, no indigestion GU:no hematuria, no dysuria MS:+ joint pain, no claudication Neuro:no syncope, no lightheadedness Endo:no diabetes, no thyroid disease  Wt Readings from Last 3 Encounters:  02/22/17 224 lb (101.6 kg)  02/08/17 225 lb (102.1 kg)  02/06/17 230 lb (104.3 kg)     PHYSICAL EXAM: VS:  BP 124/76   Pulse 80   Ht 5\' 1"  (1.549 m)   Wt 224 lb  (101.6 kg)   BMI 42.32 kg/m  , BMI Body mass index is 42.32 kg/m. General:Pleasant affect, NAD HEENT:normocephalic, sclera clear, mucus membranes moist Heart:S1S2 RRR without murmur, gallup, rub or click Lungs:clear without rales, rhonchi, or wheezes WUJ:WJXBJ,YNWG, non tender, + BS, do not palpate liver spleen or masses Ext:tr to 1+ lower ext edema, 2+ pedal pulses, 2+ radial pulses Neuro:alert and oriented X 3, MAE, follows commands, + facial symmetry  EKG:  EKG is NOT ordered today.    Recent Labs: 05/23/2016: TSH 0.90 02/06/2017: ALT 15; Hemoglobin 13.3; Platelets 185 02/08/2017: BUN 12; Creatinine, Ser 0.66; Potassium 4.0; Sodium 140    Lipid Panel    Component Value Date/Time   CHOL 201 (H) 08/11/2016 1017   TRIG 293 (H) 08/11/2016 1017   HDL 43 (L) 08/11/2016 1017   CHOLHDL 4.7 08/11/2016 1017   VLDL 59 (H) 08/11/2016 1017   LDLCALC 99 08/11/2016 1017       Other studies Reviewed: Additional studies/ records that were reviewed today include: . Echo 02/16/17 Study Conclusions  - Left ventricle: The cavity size was normal. Wall thickness was   increased in a pattern of moderate LVH. Systolic function was   normal. The estimated ejection fraction was in the range of 60%   to 65%. Wall motion was normal; there were no regional wall   motion abnormalities. Doppler parameters are consistent with   abnormal left ventricular relaxation (grade 1 diastolic   dysfunction). - Mitral valve: Mildly calcified annulus.   ASSESSMENT AND PLAN:  1.  Edema with wt gain, continues to lose wt.  Doing better with lasix and metolazone but will recheck BMP today to make sure stable if it is will leave on current dose for a month.  Will need refill of metolazone.  Keep to low salt diet. -follow up with Dr. Radford Pax in 2-3 months.  2. Chronic diastolic HF stable  3. PVCs occ.  EF has improved  4.  Morbid obesity discussed diet.  Perhaps return to [facility] or other wt loss program.     Current medicines are reviewed with the patient today.  The patient Has no concerns regarding medicines.  The following changes have been made:  See above Labs/ tests ordered today include:see above  Disposition:   FU:  see above  Signed, Cecilie Kicks, NP  02/22/2017 11:55 AM    [address], [place], [place], [place] Phone: [phone]; Fax: [phone]

## 2017-02-23 ENCOUNTER — Telehealth: Payer: Self-pay

## 2017-02-23 DIAGNOSIS — I5032 Chronic diastolic (congestive) heart failure: Secondary | ICD-10-CM

## 2017-02-23 LAB — BASIC METABOLIC PANEL
BUN / CREAT RATIO: 18 (ref 12–28)
BUN: 12 mg/dL (ref 8–27)
CO2: 25 mmol/L (ref 20–29)
CREATININE: 0.65 mg/dL (ref 0.57–1.00)
Calcium: 9.8 mg/dL (ref 8.7–10.3)
Chloride: 97 mmol/L (ref 96–106)
GFR calc Af Amer: 109 mL/min/{1.73_m2} (ref >59)
GFR, EST NON AFRICAN AMERICAN: 94 mL/min/{1.73_m2} (ref >59)
GLUCOSE: 92 mg/dL (ref 65–99)
Potassium: 3.9 mmol/L (ref 3.5–5.2)
SODIUM: 142 mmol/L (ref 134–144)

## 2017-02-23 MED ORDER — METOLAZONE 2.5 MG PO TABS: ORAL_TABLET | ORAL | 6 refills | Status: DC

## 2017-02-23 NOTE — Telephone Encounter (Signed)
Informed patient of results and verbal understanding expressed.  Patient understands to continue current medications. Repeat BMET scheduled 8/10. Patient agrees with treatment plan.

## 2017-02-23 NOTE — Addendum Note (Signed)
Addended by: Harland German A on: 02/23/2017 10:25 AM   Modules accepted: Orders

## 2017-02-23 NOTE — Telephone Encounter (Signed)
-----   Message from Isaiah Serge, NP sent at 02/23/2017  9:59 AM EDT ----- Labs are good continue meds lasix and metolazone for 4 weeks then repeat BMP.  She may need refills.

## 2017-02-26 ENCOUNTER — Ambulatory Visit: Payer: Self-pay | Admitting: Surgery

## 2017-02-26 DIAGNOSIS — N6320 Unspecified lump in the left breast, unspecified quadrant: Secondary | ICD-10-CM | POA: Diagnosis not present

## 2017-02-26 NOTE — H&P (Signed)
History of Present Illness [name]. Donatello Kleve MD; 02/26/2017 9:58 AM) The patient is a 66 year old female who presents with a breast mass. Cardiology-Dr. Golden Hurter Referred by Dr. Denyce Robert for left breast mass This is a 66 year old female with multiple medical problems who presents with a one-year history of a left breast mass. Initially this was just seen on mammogram. This was determined that it was just below the skin at the inframammary crease and likely represented a sebaceous cyst. However this has become larger and now protrudes through the skin. This area has reduced occasionally. Ultrasound showed that there does seem to be blood flow through this area. Based on these concerns, she is now referred for excision of this area.  Family history of breast cancer only in a maternal aunt  CLINICAL DATA: 32 year old patient presents for evaluation of the left breast. The patient was scheduled for a stereotactic left breast biopsy for an asymmetry in the lower outer quadrant of the left breast in October 2017. When she presented for biopsy, it was determined that the mass was localized to the skin, and on ultrasound, it had appearances consistent with a sebaceous cyst in the 5 o'clock position near the inframammary fold. The patient wanted the area evaluated today, as she feels it is more outwardly projecting at this time. She states that there has been bloody fluid emanate from this focal lesion in the skin.  EXAM: 2D DIGITAL DIAGNOSTIC LEFT MAMMOGRAM WITH CAD AND ADJUNCT TOMO  ULTRASOUND LEFT BREAST  COMPARISON: Previous exam(s).  ACR Breast Density Category a: The breast tissue is almost entirely fatty.  FINDINGS: A metallic skin marker was placed on the site of palpable concern near the inframammary fold of the left breast, 5 o'clock region. On mammography, this superficial density is slightly less apparent on today's exam, which may partially be related to the  presence of the metallic skin marker. No breast mass is identified. Negative for architectural distortion or suspicious microcalcification.  Mammographic images were processed with CAD.  On physical exam, there is an approximately 1.0 cm firm superficial lump near the inframammary fold of the left breast at 5 o'clock position. The overlying skin appears intact and normal in color. There is no fluid drainage from this area at this time. No prominent pore is identified in the overlying skin.  Targeted ultrasound is performed, showing an irregularly-shaped hypoechoic mass within the dermis at the site of the palpable lump at 5 o'clock position inframammary fold. This dermal mass measures approximately 1.1 x 0.4 x 0.6 cm and shows no significant change in size compared to the prior ultrasound of October 2017. On color and Doppler evaluation, there is definite arterial vascular flow within this lesion.  The left axilla is negative for lymphadenopathy.  IMPRESSION: 1.1 cm irregular palpable mass in the dermis of the left breast near the inframammary fold at 5 o'clock position. Given the presence of definite arterial flow within the mass, malignancy cannot be excluded.  The patient is due for bilateral mammography in October 2018.  RECOMMENDATION: Surgical consultation for excisional biopsy is suggested. Our staff will contact [facility] to arrange a surgical consultation for the patient.  I have discussed the findings and recommendations with the patient. Results were also provided in writing at the conclusion of the visit. If applicable, a reminder letter will be sent to the patient regarding the next appointment.  BI-RADS CATEGORY 4: Suspicious.   Electronically Signed By: Curlene Dolphin M.D. On: 02/01/2017 16:50  Allergies (Tanisha A. Owens Shark, RMA; 02/26/2017 9:36 AM) Cymbalta *ANTIDEPRESSANTS* Demerol *ANALGESICS - OPIOID* Dyes Morphine Sulfate  (PF) *ANALGESICS - OPIOID* Adhesive Tape Allergies Reconciled  Medication History (Tanisha A. Owens Shark, [place]; 02/26/2017 9:37 AM) Hydrocodone-Acetaminophen (5-325MG  Tablet, Oral) Active. CarBAMazepine (200MG  Tablet, Oral) Active. Cholestyramine (4GM Packet, Oral) Active. Furosemide (20MG  Tablet, Oral) Active. Gabapentin (300MG  Capsule, Oral) Active. Hydroxychloroquine Sulfate (200MG  Tablet, Oral) Active. Leflunomide (20MG  Tablet, Oral) Active. MetOLazone (2.5MG  Tablet, Oral) Active. Metoprolol Succinate ER (25MG  Tablet ER 24HR, Oral) Active. Pantoprazole Sodium (40MG  Tablet DR, Oral) Active. Potassium Chloride ER (10MEQ Tablet ER, Oral) Active. Promethazine HCl (25MG  Tablet, Oral) Active. Restasis Multidose (0.05% Emulsion, Ophthalmic) Active. TiZANidine HCl (4MG  Tablet, Oral) Active. Medications Reconciled    Vitals (Tanisha A. Brown RMA; 02/26/2017 9:35 AM) 02/26/2017 9:34 AM Weight: 216.4 lb Height: 62in Body Surface Area: 1.98 m Body Mass Index: 39.58 kg/m  Temp.: 97.22F  Pulse: 88 (Regular)  P.OX: 93% (Room air) BP: 124/84 (Sitting, Left Arm, Standard)      Physical Exam Rodman Key K. [place]ville Dani MD; 02/26/2017 10:00 AM)  The physical exam findings are as follows: Note:WDWN in NAD Eyes: Pupils equal, round; sclera anicteric HENT: Oral mucosa moist; good dentition Neck: No masses palpated, no thyromegaly Lungs: CTA bilaterally; normal respiratory effort Breasts: symmetric; pendulous; no nipple retraction or discharge No axillary lymphadenopathy No dominant masses in right breast Left breast - inframammary crease at 5:00 - 1.5 cm protruding mass with underlying firmness; no drainage currently CV: Regular rate and rhythm; no murmurs; extremities well-perfused with no edema Abd: +bowel sounds, soft, non-tender, no palpable organomegaly; no palpable hernias Skin: Warm, dry; no sign of jaundice Psychiatric - alert and oriented x 4; calm mood and  affect    Assessment & Plan Rodman Key K. Anivea Velasques MD; 02/26/2017 9:50 AM)  LEFT BREAST MASS (N63.20) Impression: 1.5 cm - inframammary crease  Current Plans Schedule for Surgery - Excision of left breast mass. The surgical procedure has been discussed with the patient. Potential risks, benefits, alternative treatments, and expected outcomes have been explained. All of the patient's questions at this time have been answered. The likelihood of reaching the patient's treatment goal is good. The patient understand the proposed surgical procedure and wishes to proceed.  [name]. Georgette Dover, MD, [facility]  General/ Trauma Surgery  02/26/2017 10:00 AM

## 2017-03-09 ENCOUNTER — Other Ambulatory Visit: Payer: Self-pay | Admitting: Rheumatology

## 2017-03-09 NOTE — Telephone Encounter (Signed)
10/13/16 last visit  03/15/17 next visit  Labs: 02/06/17 stable   Okay to refill per Dr. Estanislado Pandy

## 2017-03-12 DIAGNOSIS — I7300 Raynaud's syndrome without gangrene: Secondary | ICD-10-CM | POA: Insufficient documentation

## 2017-03-12 NOTE — Progress Notes (Deleted)
Office Visit Note  Patient: [name]             Date of Birth: 06/06/1951           MRN: [id number]             PCP: Mellody Dance, DO Referring: Mellody Dance, DO Visit Date: 03/15/2017 Occupation: @GUAROCC @    Subjective:  No chief complaint on file.   History of Present Illness: [name] is a 66 y.o. female ***   Activities of Daily Living:  Patient reports morning stiffness for *** {minute/hour:19697}.   Patient {ACTIONS;DENIES/REPORTS:21021675::"Denies"} nocturnal pain.  Difficulty dressing/grooming: {ACTIONS;DENIES/REPORTS:21021675::"Denies"} Difficulty climbing stairs: {ACTIONS;DENIES/REPORTS:21021675::"Denies"} Difficulty getting out of chair: {ACTIONS;DENIES/REPORTS:21021675::"Denies"} Difficulty using hands for taps, buttons, cutlery, and/or writing: {ACTIONS;DENIES/REPORTS:21021675::"Denies"}   No Rheumatology ROS completed.   PMFS History:  Patient Active Problem List   Diagnosis Date Noted  . Raynaud's disease without gangrene 03/12/2017  . Abnormal weight gain 02/06/2017  . Shingles outbreak 02/06/2017  . Cyst (solitary) of breast, left 01/31/2017  . Eosinophilic esophagitis 62/69/4854  . History of hyperlipidemia 10/04/2016  . 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 History of diabetes mellitus 09/09/2016  . History of diverticulitis 09/03/2016  . Osteoporosis 09/03/2016  . Autoimmune disease ([employer]) 09/02/2016  . High risk medication use 09/02/2016  . History of esophagitis 09/02/2016  . Elevated triglycerides with high cholesterol 08/23/2016  . Low serum HDL 08/23/2016  . Breast cyst, left 06/14/2016  . Encounter for wellness examination 05/23/2016  . Abnormality of gait 05/09/2016  . Hypokalemia 03/22/2016  . Sjoegren syndrome ([place]) 02/29/2016  . On potassium wasting diuretic therapy 02/29/2016  . GERD (gastroesophageal reflux disease) 01/19/2016  . Glucose intolerance (impaired glucose tolerance) 01/19/2016  . Chronic  diastolic CHF (congestive heart failure) ([place]) 04/20/2015  . Peripheral polyneuropathy 02/02/2014  . Morbid obesity ([place]) 11/17/2013  . Heart murmur   . OSA (obstructive sleep apnea)   . Rheumatoid arthritis ([place])   . Hiatal hernia   . Fibromyalgia   . PVC's (premature ventricular contractions)   . 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 Unilateral primary osteoarthritis, left knee 06/22/2011    Past Medical History:  Diagnosis Date  . Arthritis    Rheumatoid arthritis,   . Blood transfusion    1981  . Chronic diastolic CHF (congestive heart failure) ([place])   . Fibromyalgia   . Heart murmur    as a child  . Hiatal hernia    sjorgens syndrome  . OSA (obstructive sleep apnea)   . Peripheral autonomic neuropathy of unknown cause     Family History  Problem Relation Age of Onset  . Alzheimer's disease Mother   . Heart attack Mother   . Hypertension Mother   . Glaucoma Mother   . Cancer Father   . Heart attack Brother   . Heart disease Brother   . Glaucoma Brother   . Hyperlipidemia Brother   . Glaucoma Brother    Past Surgical History:  Procedure Laterality Date  . ABDOMINAL HYSTERECTOMY     BTL, BSO  . CHOLECYSTECTOMY    . DIAGNOSTIC LAPAROSCOPY     x3  . KNEE ARTHROSCOPY     x2  . patotid cystectomy    . TENDON REPAIR  1980   left ankle and tibia  . TONSILLECTOMY    . TOTAL KNEE ARTHROPLASTY  06/21/2011   Procedure: TOTAL KNEE ARTHROPLASTY;  Surgeon: Gearlean Alf;  Location: [facility];  Service: Orthopedics;  Laterality: Right;  . TUBAL LIGATION     Social History   Social History Narrative   Lives at home alone   Right-handed   Drinks 1 or less cups of coffee and 2 servings of either tea or soda per day     Objective: Vital Signs: There were no vitals taken for this visit.   Physical Exam   Musculoskeletal Exam: ***  CDAI Exam: No CDAI exam completed.    Investigation: Findings:  UDS and Narcotic agreement  02/16/2017  06/2015  eye exam no Plaquenil Toxicity   CBC Latest Ref Rng & Units 02/06/2017 10/13/2016 08/11/2016  WBC 3.4 - 10.8 x10E3/uL 5.2 4.4 4.4  Hemoglobin 11.1 - 15.9 g/dL 13.3 13.1 13.2  Hematocrit 34.0 - 46.6 % 42.2 40.4 41.1  Platelets 150 - 379 x10E3/uL 185 171 196   CMP Latest Ref Rng & Units 02/22/2017 02/08/2017 02/06/2017  Glucose 65 - 99 mg/dL 92 93 128(H)  BUN 8 - 27 mg/dL 12 12 13   Creatinine 0.57 - 1.00 mg/dL 0.65 0.66 0.54(L)  Sodium 134 - 144 mmol/L 142 140 145(H)  Potassium 3.5 - 5.2 mmol/L 3.9 4.0 4.2  Chloride 96 - 106 mmol/L 97 101 103  CO2 20 - 29 mmol/L 25 24 23   Calcium 8.7 - 10.3 mg/dL 9.8 9.5 9.4  Total Protein 6.0 - 8.5 g/dL - - 6.5  Total Bilirubin 0.0 - 1.2 mg/dL - - 0.2  Alkaline Phos 39 - 117 IU/L - - 98  AST 0 - 40 IU/L - - 21  ALT 0 - 32 IU/L - - 15    Imaging: No results found.  Speciality Comments: No specialty comments available.    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  No orders of the defined types were placed in this encounter.   Face-to-face time spent with patient was *** minutes. 50% of time was spent in counseling and  coordination of care.  Follow-Up Instructions: No Follow-up on file.   Amy Littrell, RT  Note - This record has been created using [employer].  Chart creation errors have been sought, but may not always  have been located. Such creation errors do not reflect on  the standard of medical care.

## 2017-03-13 ENCOUNTER — Telehealth: Payer: Self-pay | Admitting: Cardiology

## 2017-03-13 ENCOUNTER — Encounter: Payer: Self-pay | Admitting: *Deleted

## 2017-03-13 NOTE — Telephone Encounter (Signed)
Yes pt is low cardiac risk for breast mass excision.

## 2017-03-13 NOTE — Telephone Encounter (Signed)
Called pt back and let her know, per Cecilie Kicks, NP, that pt could be cleared for her upcoming surgery at a low risk. Mickel Baas is out of the office so I will take it to a colleague to get her clearance signed.  Pt thanked me for all my help.

## 2017-03-13 NOTE — Telephone Encounter (Signed)
F/u message  Pt call requesting to speak with RN about her Cardiac clearance. Please call back to discuss

## 2017-03-13 NOTE — Telephone Encounter (Signed)
Returned pts call.  Pt is awaiting to be scheduled for Excision of Left Breast Mass and is needing cardiac clearance. Pt was just seen in office 02/22/17 with Cecilie Kicks, NP, and has had a recent Echo done 02/19/17. I advised pt I would send Mickel Baas a message to see if she could clear pt based off that appt or would she need another one.  Pt advised that I would call her back when I hear back from [place]. Pt verbalized appreciation and understanding.   Mickel Baas, please advise!

## 2017-03-15 ENCOUNTER — Ambulatory Visit: Payer: PPO | Admitting: Rheumatology

## 2017-03-18 DIAGNOSIS — R269 Unspecified abnormalities of gait and mobility: Secondary | ICD-10-CM | POA: Diagnosis not present

## 2017-03-18 DIAGNOSIS — G4733 Obstructive sleep apnea (adult) (pediatric): Secondary | ICD-10-CM | POA: Diagnosis not present

## 2017-03-19 NOTE — Progress Notes (Signed)
Cardiac clearance Dorene Ar NP [phone] epic  LOV cardiac Dorene Ar NP 02-22-17 epic  BMP 02-22-17 epic  EKG 02-08-17 epicCXR 02-06-17 epic

## 2017-03-19 NOTE — Patient Instructions (Signed)
[name]  03/19/2017   Your procedure is scheduled on: 03-22-17  Report to [place]  elevators to 3rd floor to  [place] at [facility].    Call this number if you have problems the morning of surgery [phone]   Remember: ONLY 1 PERSON MAY GO WITH YOU TO SHORT STAY TO GET  READY MORNING OF [place].  Do not eat food or drink liquids :After Midnight.     Take these medicines the morning of surgery with A SIP OF WATER: carbamazepine(tegretol) , eye drops, hydrocodone as needed, pantoprazole(protonix)                                 You may not have any metal on your body including hair pins and              piercings  Do not wear jewelry, make-up, lotions, powders or perfumes, deodorant             Do not wear nail polish.  Do not shave  48 hours prior to surgery.        Do not bring valuables to the hospital. [place].  Contacts, dentures or bridgework may not be worn into surgery.      Patients discharged the day of surgery will not be allowed to drive home.  Name and phone number of your driver:  Special Instructions: N/A              Please read over the following fact sheets you were given: _____________________________________________________________________           [facility] - Preparing for Surgery Before surgery, you can play an important role.  Because skin is not sterile, your skin needs to be as free of germs as possible.  You can reduce the number of germs on your skin by washing with CHG (chlorahexidine gluconate) soap before surgery.  CHG is an antiseptic cleaner which kills germs and bonds with the skin to continue killing germs even after washing. Please DO NOT use if you have an allergy to CHG or antibacterial soaps.  If your skin becomes reddened/irritated stop using the CHG and inform your nurse when you arrive at Short Stay. Do not shave  (including legs and underarms) for at least 48 hours prior to the first CHG shower.  You may shave your face/neck. Please follow these instructions carefully:  1.  Shower with CHG Soap the night before surgery and the  morning of Surgery.  2.  If you choose to wash your hair, wash your hair first as usual with your  normal  shampoo.  3.  After you shampoo, rinse your hair and body thoroughly to remove the  shampoo.                           4.  Use CHG as you would any other liquid soap.  You can apply chg directly  to the skin and wash                       Gently with a scrungie or clean washcloth.  5.  Apply the CHG Soap to your body ONLY FROM THE NECK DOWN.   Do not use on face/ open                           Wound or open sores. Avoid contact with eyes, ears mouth and genitals (private parts).                       Wash face,  Genitals (private parts) with your normal soap.             6.  Wash thoroughly, paying special attention to the area where your surgery  will be performed.  7.  Thoroughly rinse your body with warm water from the neck down.  8.  DO NOT shower/wash with your normal soap after using and rinsing off  the CHG Soap.                9.  Pat yourself dry with a clean towel.            10.  Wear clean pajamas.            11.  Place clean sheets on your bed the night of your first shower and do not  sleep with pets. Day of Surgery : Do not apply any lotions/deodorants the morning of surgery.  Please wear clean clothes to the hospital/surgery center.  FAILURE TO FOLLOW THESE INSTRUCTIONS MAY RESULT IN THE CANCELLATION OF YOUR SURGERY PATIENT SIGNATURE_________________________________  NURSE SIGNATURE__________________________________  ________________________________________________________________________

## 2017-03-20 ENCOUNTER — Other Ambulatory Visit (HOSPITAL_COMMUNITY): Payer: Self-pay | Admitting: Emergency Medicine

## 2017-03-20 ENCOUNTER — Encounter (HOSPITAL_COMMUNITY)
Admission: RE | Admit: 2017-03-20 | Discharge: 2017-03-20 | Disposition: A | Discharge status: Home / Self Care | Payer: PPO | Source: Ambulatory Visit | Attending: Surgery | Admitting: Surgery

## 2017-03-20 ENCOUNTER — Encounter (HOSPITAL_COMMUNITY): Payer: Self-pay

## 2017-03-20 DIAGNOSIS — Z79899 Other long term (current) drug therapy: Secondary | ICD-10-CM | POA: Diagnosis not present

## 2017-03-20 DIAGNOSIS — N6320 Unspecified lump in the left breast, unspecified quadrant: Secondary | ICD-10-CM | POA: Diagnosis not present

## 2017-03-20 DIAGNOSIS — M797 Fibromyalgia: Secondary | ICD-10-CM | POA: Diagnosis not present

## 2017-03-20 DIAGNOSIS — M1990 Unspecified osteoarthritis, unspecified site: Secondary | ICD-10-CM | POA: Diagnosis not present

## 2017-03-20 DIAGNOSIS — I509 Heart failure, unspecified: Secondary | ICD-10-CM | POA: Diagnosis not present

## 2017-03-20 DIAGNOSIS — K219 Gastro-esophageal reflux disease without esophagitis: Secondary | ICD-10-CM | POA: Diagnosis not present

## 2017-03-20 DIAGNOSIS — D242 Benign neoplasm of left breast: Secondary | ICD-10-CM | POA: Diagnosis not present

## 2017-03-20 DIAGNOSIS — G4730 Sleep apnea, unspecified: Secondary | ICD-10-CM | POA: Diagnosis not present

## 2017-03-20 LAB — CBC
HEMATOCRIT: 40.3 % (ref 36.0–46.0)
Hemoglobin: 13.8 g/dL (ref 12.0–15.0)
MCH: 29.7 pg (ref 26.0–34.0)
MCHC: 34.2 g/dL (ref 30.0–36.0)
MCV: 86.9 fL (ref 78.0–100.0)
PLATELETS: 177 10*3/uL (ref 150–400)
RBC: 4.64 MIL/uL (ref 3.87–5.11)
RDW: 13 % (ref 11.5–15.5)
WBC: 5.8 10*3/uL (ref 4.0–10.5)

## 2017-03-20 LAB — BASIC METABOLIC PANEL
Anion gap: 14 (ref 5–15)
BUN: 13 mg/dL (ref 6–20)
CALCIUM: 9.3 mg/dL (ref 8.9–10.3)
CO2: 34 mmol/L — ABNORMAL HIGH (ref 22–32)
CREATININE: 0.69 mg/dL (ref 0.44–1.00)
Chloride: 90 mmol/L — ABNORMAL LOW (ref 101–111)
GFR calc Af Amer: >60 mL/min (ref >60)
GLUCOSE: 122 mg/dL — AB (ref 65–99)
POTASSIUM: 2.6 mmol/L — AB (ref 3.5–5.1)
Sodium: 138 mmol/L (ref 135–145)

## 2017-03-20 NOTE — Progress Notes (Signed)
[name], please get this patient on KCl 40 mEq PO BID as soon as possible.  Thanks

## 2017-03-20 NOTE — Progress Notes (Signed)
CRITICAL VALUE ALERT  Critical Value:  Potassium 2.6  Date & Time Notied:  03-20-17; 1521  Provider Notified: RN called [facility] at 1326;  spoke to Abigail Butts , she says she will have to page Dr Gershon Crane ; gave Abigail Butts RN contact info.   1548 call received by Jeralene Peters RN as she is holding phone for Dr Gershon Crane that is currently doing surgery. RN gave critical lab , Vaughan Basta called it out to [facility], [facility] wanting medication orders for patient, RN informed no doctor here to put in orders and RN can only put in anesthesia orders. Per Catha Brow says ok, when he finishes surgery , he will call RN back.  Orders Received/Actions taken: currently none received at 1548   Update 1745 No call back from [place]. RN called [place] [facility], spoke to robin. She says [facility]se[facility] is out of surgery now and she gave RN his pager. Rn paged. Waiting for reply.   Update 1752. Dr Gershon Crane called back. Says he will have his nurse call in rx for patient. Also would like repeat of potassium day of surgery. RN used [facility] to confirm recommendation and will place anesthesia order for repeat lab

## 2017-03-22 ENCOUNTER — Ambulatory Visit (HOSPITAL_COMMUNITY): Payer: PPO | Admitting: Registered Nurse

## 2017-03-22 ENCOUNTER — Ambulatory Visit (HOSPITAL_COMMUNITY)
Admission: RE | Admit: 2017-03-22 | Discharge: 2017-03-22 | Disposition: A | Discharge status: Home / Self Care | Payer: PPO | Source: Ambulatory Visit | Attending: Surgery | Admitting: Surgery

## 2017-03-22 ENCOUNTER — Encounter (HOSPITAL_COMMUNITY)
Admission: RE | Disposition: A | Discharge status: Home / Self Care | Payer: Self-pay | Source: Ambulatory Visit | Attending: Surgery

## 2017-03-22 DIAGNOSIS — M797 Fibromyalgia: Secondary | ICD-10-CM | POA: Insufficient documentation

## 2017-03-22 DIAGNOSIS — N6320 Unspecified lump in the left breast, unspecified quadrant: Secondary | ICD-10-CM | POA: Diagnosis not present

## 2017-03-22 DIAGNOSIS — G4730 Sleep apnea, unspecified: Secondary | ICD-10-CM | POA: Insufficient documentation

## 2017-03-22 DIAGNOSIS — I509 Heart failure, unspecified: Secondary | ICD-10-CM | POA: Diagnosis not present

## 2017-03-22 DIAGNOSIS — M1990 Unspecified osteoarthritis, unspecified site: Secondary | ICD-10-CM | POA: Diagnosis not present

## 2017-03-22 DIAGNOSIS — D242 Benign neoplasm of left breast: Secondary | ICD-10-CM | POA: Diagnosis not present

## 2017-03-22 DIAGNOSIS — G4733 Obstructive sleep apnea (adult) (pediatric): Secondary | ICD-10-CM | POA: Diagnosis not present

## 2017-03-22 DIAGNOSIS — K219 Gastro-esophageal reflux disease without esophagitis: Secondary | ICD-10-CM | POA: Diagnosis not present

## 2017-03-22 DIAGNOSIS — Z79899 Other long term (current) drug therapy: Secondary | ICD-10-CM | POA: Diagnosis not present

## 2017-03-22 HISTORY — PX: MASS EXCISION: SHX2000

## 2017-03-22 LAB — BASIC METABOLIC PANEL
Anion gap: 12 (ref 5–15)
BUN: 9 mg/dL (ref 6–20)
CALCIUM: 9.2 mg/dL (ref 8.9–10.3)
CO2: 29 mmol/L (ref 22–32)
CREATININE: 0.67 mg/dL (ref 0.44–1.00)
Chloride: 95 mmol/L — ABNORMAL LOW (ref 101–111)
GFR calc Af Amer: >60 mL/min (ref >60)
GLUCOSE: 122 mg/dL — AB (ref 65–99)
POTASSIUM: 3.4 mmol/L — AB (ref 3.5–5.1)
SODIUM: 136 mmol/L (ref 135–145)

## 2017-03-22 SURGERY — EXCISION MASS
Anesthesia: General | Site: Breast | Laterality: Left

## 2017-03-22 MED ORDER — ACETAMINOPHEN 500 MG PO TABS
1000.0000 mg | ORAL_TABLET | ORAL | Status: AC
  Administered 2017-03-22: 1000 mg via ORAL
  Filled 2017-03-22: qty 2

## 2017-03-22 MED ORDER — BUPIVACAINE-EPINEPHRINE (PF) 0.25% -1:200000 IJ SOLN
INTRAMUSCULAR | Status: AC
  Filled 2017-03-22: qty 30

## 2017-03-22 MED ORDER — LACTATED RINGERS IV SOLN
INTRAVENOUS | Status: DC
  Administered 2017-03-22: 09:00:00 via INTRAVENOUS

## 2017-03-22 MED ORDER — CEFAZOLIN SODIUM-DEXTROSE 2-4 GM/100ML-% IV SOLN
2.0000 g | INTRAVENOUS | Status: AC
  Administered 2017-03-22: 2 g via INTRAVENOUS
  Filled 2017-03-22: qty 100

## 2017-03-22 MED ORDER — MIDAZOLAM HCL 2 MG/2ML IJ SOLN
INTRAMUSCULAR | Status: AC
  Filled 2017-03-22: qty 2

## 2017-03-22 MED ORDER — BUPIVACAINE HCL (PF) 0.25 % IJ SOLN
INTRAMUSCULAR | Status: AC
  Filled 2017-03-22: qty 30

## 2017-03-22 MED ORDER — FENTANYL CITRATE (PF) 100 MCG/2ML IJ SOLN
INTRAMUSCULAR | Status: DC | PRN
  Administered 2017-03-22: 50 ug via INTRAVENOUS

## 2017-03-22 MED ORDER — GLYCOPYRROLATE 0.2 MG/ML IJ SOLN
INTRAMUSCULAR | Status: DC | PRN
  Administered 2017-03-22: 0.2 mg via INTRAVENOUS

## 2017-03-22 MED ORDER — BUPIVACAINE-EPINEPHRINE 0.25% -1:200000 IJ SOLN
INTRAMUSCULAR | Status: DC | PRN
  Administered 2017-03-22: 10 mL

## 2017-03-22 MED ORDER — PROPOFOL 10 MG/ML IV BOLUS
INTRAVENOUS | Status: AC
  Filled 2017-03-22: qty 20

## 2017-03-22 MED ORDER — HYDROCODONE-ACETAMINOPHEN 5-325 MG PO TABS
1.0000 | ORAL_TABLET | ORAL | Status: DC | PRN
  Administered 2017-03-22: 1 via ORAL
  Filled 2017-03-22: qty 1

## 2017-03-22 MED ORDER — DEXAMETHASONE SODIUM PHOSPHATE 10 MG/ML IJ SOLN
INTRAMUSCULAR | Status: AC
  Filled 2017-03-22: qty 1

## 2017-03-22 MED ORDER — FENTANYL CITRATE (PF) 100 MCG/2ML IJ SOLN
INTRAMUSCULAR | Status: AC
  Filled 2017-03-22: qty 2

## 2017-03-22 MED ORDER — LIDOCAINE 2% (20 MG/ML) 5 ML SYRINGE
INTRAMUSCULAR | Status: AC
  Filled 2017-03-22: qty 5

## 2017-03-22 MED ORDER — MIDAZOLAM HCL 5 MG/5ML IJ SOLN
INTRAMUSCULAR | Status: DC | PRN
  Administered 2017-03-22: 2 mg via INTRAVENOUS

## 2017-03-22 MED ORDER — LIDOCAINE HCL (CARDIAC) 10 MG/ML IV SOLN
INTRAVENOUS | Status: DC | PRN
  Administered 2017-03-22: 100 mg via INTRAVENOUS

## 2017-03-22 MED ORDER — ONDANSETRON HCL 4 MG/2ML IJ SOLN
INTRAMUSCULAR | Status: DC | PRN
  Administered 2017-03-22: 4 mg via INTRAVENOUS

## 2017-03-22 MED ORDER — PROPOFOL 10 MG/ML IV BOLUS
INTRAVENOUS | Status: DC | PRN
  Administered 2017-03-22: 200 mg via INTRAVENOUS

## 2017-03-22 MED ORDER — CHLORHEXIDINE GLUCONATE CLOTH 2 % EX PADS: 6.0000 | MEDICATED_PAD | Freq: Once | CUTANEOUS | Status: DC

## 2017-03-22 MED ORDER — ONDANSETRON HCL 4 MG/2ML IJ SOLN
INTRAMUSCULAR | Status: AC
  Filled 2017-03-22: qty 2

## 2017-03-22 MED ORDER — LIDOCAINE HCL 1 % IJ SOLN
INTRAMUSCULAR | Status: AC
  Filled 2017-03-22: qty 20

## 2017-03-22 MED ORDER — GABAPENTIN 300 MG PO CAPS
300.0000 mg | ORAL_CAPSULE | ORAL | Status: AC
  Administered 2017-03-22: 300 mg via ORAL
  Filled 2017-03-22: qty 1

## 2017-03-22 MED ORDER — FENTANYL CITRATE (PF) 100 MCG/2ML IJ SOLN: 25.0000 ug | INTRAMUSCULAR | Status: DC | PRN

## 2017-03-22 MED ORDER — DEXAMETHASONE SODIUM PHOSPHATE 10 MG/ML IJ SOLN
INTRAMUSCULAR | Status: DC | PRN
  Administered 2017-03-22: 10 mg via INTRAVENOUS

## 2017-03-22 SURGICAL SUPPLY — 37 items
ADH SKN CLS APL DERMABOND .7 (GAUZE/BANDAGES/DRESSINGS)
APL SKNCLS STERI-STRIP NONHPOA (GAUZE/BANDAGES/DRESSINGS) ×1
BENZOIN TINCTURE PRP APPL 2/3 (GAUZE/BANDAGES/DRESSINGS) ×2 IMPLANT
BLADE HEX COATED 2.75 (ELECTRODE) IMPLANT
BLADE SURG 15 STRL LF DISP TIS (BLADE) ×1 IMPLANT
BLADE SURG 15 STRL SS (BLADE) ×3
CANISTER SUCTION 1200CC (MISCELLANEOUS) IMPLANT
CHLORAPREP W/TINT 26ML (MISCELLANEOUS) ×3 IMPLANT
CLOSURE WOUND 1/2 X4 (GAUZE/BANDAGES/DRESSINGS) ×1
DECANTER SPIKE VIAL GLASS SM (MISCELLANEOUS) IMPLANT
DERMABOND ADVANCED (GAUZE/BANDAGES/DRESSINGS)
DERMABOND ADVANCED .7 DNX12 (GAUZE/BANDAGES/DRESSINGS) ×1 IMPLANT
DRAPE UTILITY XL STRL (DRAPES) ×3 IMPLANT
DRSG TEGADERM 4X4.75 (GAUZE/BANDAGES/DRESSINGS) ×3 IMPLANT
ELECT COATED BLADE 2.86 ST (ELECTRODE) ×2 IMPLANT
ELECT REM PT RETURN 15FT ADLT (MISCELLANEOUS) ×3 IMPLANT
GAUZE PACKING IODOFORM 1/4X15 (GAUZE/BANDAGES/DRESSINGS) IMPLANT
GAUZE SPONGE 2X2 8PLY STRL LF (GAUZE/BANDAGES/DRESSINGS) ×1 IMPLANT
GAUZE SPONGE 4X4 12PLY STRL (GAUZE/BANDAGES/DRESSINGS) IMPLANT
GLOVE BIO SURGEON STRL SZ7 (GLOVE) ×3 IMPLANT
GLOVE BIOGEL PI IND STRL 7.5 (GLOVE) ×1 IMPLANT
GLOVE BIOGEL PI INDICATOR 7.5 (GLOVE) ×2
GOWN STRL REUS W/TWL LRG LVL3 (GOWN DISPOSABLE) ×3 IMPLANT
GOWN STRL REUS W/TWL XL LVL3 (GOWN DISPOSABLE) ×1 IMPLANT
NEEDLE HYPO 25X1 1.5 SAFETY (NEEDLE) ×3 IMPLANT
NS IRRIG 1000ML POUR BTL (IV SOLUTION) ×2 IMPLANT
PACK GENERAL/GYN (CUSTOM PROCEDURE TRAY) ×3 IMPLANT
SPONGE GAUZE 2X2 STER 10/PKG (GAUZE/BANDAGES/DRESSINGS) ×2
STRIP CLOSURE SKIN 1/2X4 (GAUZE/BANDAGES/DRESSINGS) ×1 IMPLANT
SUT ETHILON 4 0 PS 2 18 (SUTURE) IMPLANT
SUT MNCRL AB 4-0 PS2 18 (SUTURE) ×3 IMPLANT
SUT SILK 2 0 SH (SUTURE) IMPLANT
SUT VIC AB 3-0 SH 27 (SUTURE) ×3
SUT VIC AB 3-0 SH 27XBRD (SUTURE) ×1 IMPLANT
SUT VIC AB 4-0 SH 18 (SUTURE) IMPLANT
SYR CONTROL 10ML LL (SYRINGE) ×3 IMPLANT
TOWEL OR 17X26 10 PK STRL BLUE (TOWEL DISPOSABLE) ×3 IMPLANT

## 2017-03-22 NOTE — Anesthesia Postprocedure Evaluation (Signed)
Anesthesia Post Note  Patient: [name]  Procedure(s) Performed: Procedure(s) (LRB): EXCISION OF LEFT BREAST MASS (Left)     Patient location during evaluation: PACU Anesthesia Type: General Level of consciousness: awake Pain management: pain level controlled Vital Signs Assessment: post-procedure vital signs reviewed and stable Respiratory status: spontaneous breathing Cardiovascular status: stable Postop Assessment: no signs of nausea or vomiting Anesthetic complications: no    Last Vitals:  Vitals:   03/22/17 1202 03/22/17 1247  BP: 133/70 132/60  Pulse: 68 76  Resp: 16 18  Temp: 36.7 C 36.8 C  SpO2: 96% 94%    Last Pain:  Vitals:   03/22/17 1247  TempSrc: Oral  PainSc: 2                  Makih Stefanko

## 2017-03-22 NOTE — Interval H&P Note (Signed)
History and Physical Interval Note:  03/22/2017 8:56 AM  [name]  has presented today for surgery, with the diagnosis of Left breast mass - inframammary crease  The various methods of treatment have been discussed with the patient and family. After consideration of risks, benefits and other options for treatment, the patient has consented to  Procedure(s): EXCISION OF LEFT BREAST MASS (Left) as a surgical intervention .  The patient's history has been reviewed, patient examined, no change in status, stable for surgery.  I have reviewed the patient's chart and labs.  Questions were answered to the patient's satisfaction.     [name]

## 2017-03-22 NOTE — Anesthesia Procedure Notes (Signed)
Procedure Name: LMA Insertion Date/Time: 03/22/2017 10:43 AM Performed by: Enrigue Catena E Pre-anesthesia Checklist: Patient identified, Emergency Drugs available, Suction available and Patient being monitored Patient Re-evaluated:Patient Re-evaluated prior to induction Oxygen Delivery Method: Circle system utilized Preoxygenation: Pre-oxygenation with 100% oxygen Induction Type: IV induction Ventilation: Mask ventilation without difficulty LMA: LMA with gastric port inserted LMA Size: 4.0 Number of attempts: 1 Airway Equipment and Method: Oral airway Placement Confirmation: positive ETCO2 Tube secured with: Tape Dental Injury: Teeth and Oropharynx as per pre-operative assessment

## 2017-03-22 NOTE — Op Note (Signed)
Preop diagnosis: Left breast mass Postop diagnosis: same Procedure performed: Leftexcisional breast biopsy Surgeon:TSUEI,MATTHEW K. Anesthesia: Gen. Via LMA Indications: This is a 65-year-old female who presents with a one-year history of a palpable left breast mass. This area is just below the skin at the inframammary crease. This has become larger and is more easily palpable. She presents now for excision of this area.  Description of procedure: The patient is brought to the operating room and placed in a supine position on the operative table. After an adequate level general anesthesia was obtained she was placed in  Trendelenburg nd her breast was taped superiorly. We prepped the lower part of the breast and inframammary crease with ChloraPrep and draped in sterile fashion.  Thin mass is easily palpable measures about 2 cm across.A timeout was taken to ensure the proper patient proper procedure. We infiltrated the area ov the mass with 0.25% Marcaine with epinephrine. I made an elliptical incision around the entire mass. We dissected down to the dermis down into normal breast tissue. We completely excised the mass. The mass was oriented with a paint kit sent for pathologic examination. The wound was inspected for hemostasis. We closed the wound with 3-0 Vicryl and 4 Monocryl. Benzoin Steri-Strips were applied. The patient was then extubated and brought to the recovery room in stable condition. All sponge, instrument and needle counts are correct.  Matthew K. Tsuei, MD, FACS [facility]  General/ Trauma Surgery  03/22/2017 11:18 AM  

## 2017-03-22 NOTE — H&P (View-Only) (Signed)
History of Present Illness [name]. Marisa Swift MD; 02/26/2017 9:58 AM) The patient is a 66 year old female who presents with a breast mass. Cardiology-Dr. Golden Hurter Referred by Dr. Denyce Robert for left breast mass This is a 65 year old female with multiple medical problems who presents with a one-year history of a left breast mass. Initially this was just seen on mammogram. This was determined that it was just below the skin at the inframammary crease and likely represented a sebaceous cyst. However this has become larger and now protrudes through the skin. This area has reduced occasionally. Ultrasound showed that there does seem to be blood flow through this area. Based on these concerns, she is now referred for excision of this area.  Family history of breast cancer only in a maternal aunt  CLINICAL DATA: 58 year old patient presents for evaluation of the left breast. The patient was scheduled for a stereotactic left breast biopsy for an asymmetry in the lower outer quadrant of the left breast in October 2017. When she presented for biopsy, it was determined that the mass was localized to the skin, and on ultrasound, it had appearances consistent with a sebaceous cyst in the 5 o'clock position near the inframammary fold. The patient wanted the area evaluated today, as she feels it is more outwardly projecting at this time. She states that there has been bloody fluid emanate from this focal lesion in the skin.  EXAM: 2D DIGITAL DIAGNOSTIC LEFT MAMMOGRAM WITH CAD AND ADJUNCT TOMO  ULTRASOUND LEFT BREAST  COMPARISON: Previous exam(s).  ACR Breast Density Category a: The breast tissue is almost entirely fatty.  FINDINGS: A metallic skin marker was placed on the site of palpable concern near the inframammary fold of the left breast, 5 o'clock region. On mammography, this superficial density is slightly less apparent on today's exam, which may partially be related to the  presence of the metallic skin marker. No breast mass is identified. Negative for architectural distortion or suspicious microcalcification.  Mammographic images were processed with CAD.  On physical exam, there is an approximately 1.0 cm firm superficial lump near the inframammary fold of the left breast at 5 o'clock position. The overlying skin appears intact and normal in color. There is no fluid drainage from this area at this time. No prominent pore is identified in the overlying skin.  Targeted ultrasound is performed, showing an irregularly-shaped hypoechoic mass within the dermis at the site of the palpable lump at 5 o'clock position inframammary fold. This dermal mass measures approximately 1.1 x 0.4 x 0.6 cm and shows no significant change in size compared to the prior ultrasound of October 2017. On color and Doppler evaluation, there is definite arterial vascular flow within this lesion.  The left axilla is negative for lymphadenopathy.  IMPRESSION: 1.1 cm irregular palpable mass in the dermis of the left breast near the inframammary fold at 5 o'clock position. Given the presence of definite arterial flow within the mass, malignancy cannot be excluded.  The patient is due for bilateral mammography in October 2018.  RECOMMENDATION: Surgical consultation for excisional biopsy is suggested. Our staff will contact [facility] to arrange a surgical consultation for the patient.  I have discussed the findings and recommendations with the patient. Results were also provided in writing at the conclusion of the visit. If applicable, a reminder letter will be sent to the patient regarding the next appointment.  BI-RADS CATEGORY 4: Suspicious.   Electronically Signed By: Curlene Dolphin M.D. On: 02/01/2017 16:50  Allergies (Tanisha A. Owens Shark, RMA; 02/26/2017 9:36 AM) Cymbalta *ANTIDEPRESSANTS* Demerol *ANALGESICS - OPIOID* Dyes Morphine Sulfate  (PF) *ANALGESICS - OPIOID* Adhesive Tape Allergies Reconciled  Medication History (Tanisha A. Owens Shark, [place]; 02/26/2017 9:37 AM) Hydrocodone-Acetaminophen (5-325MG  Tablet, Oral) Active. CarBAMazepine (200MG  Tablet, Oral) Active. Cholestyramine (4GM Packet, Oral) Active. Furosemide (20MG  Tablet, Oral) Active. Gabapentin (300MG  Capsule, Oral) Active. Hydroxychloroquine Sulfate (200MG  Tablet, Oral) Active. Leflunomide (20MG  Tablet, Oral) Active. MetOLazone (2.5MG  Tablet, Oral) Active. Metoprolol Succinate ER (25MG  Tablet ER 24HR, Oral) Active. Pantoprazole Sodium (40MG  Tablet DR, Oral) Active. Potassium Chloride ER (10MEQ Tablet ER, Oral) Active. Promethazine HCl (25MG  Tablet, Oral) Active. Restasis Multidose (0.05% Emulsion, Ophthalmic) Active. TiZANidine HCl (4MG  Tablet, Oral) Active. Medications Reconciled    Vitals (Tanisha A. Brown RMA; 02/26/2017 9:35 AM) 02/26/2017 9:34 AM Weight: 216.4 lb Height: 62in Body Surface Area: 1.98 m Body Mass Index: 39.58 kg/m  Temp.: 97.22F  Pulse: 88 (Regular)  P.OX: 93% (Room air) BP: 124/84 (Sitting, Left Arm, Standard)      Physical Exam Rodman Key K. Vivian Neuwirth MD; 02/26/2017 10:00 AM)  The physical exam findings are as follows: Note:WDWN in NAD Eyes: Pupils equal, round; sclera anicteric HENT: Oral mucosa moist; good dentition Neck: No masses palpated, no thyromegaly Lungs: CTA bilaterally; normal respiratory effort Breasts: symmetric; pendulous; no nipple retraction or discharge No axillary lymphadenopathy No dominant masses in right breast Left breast - inframammary crease at 5:00 - 1.5 cm protruding mass with underlying firmness; no drainage currently CV: Regular rate and rhythm; no murmurs; extremities well-perfused with no edema Abd: +bowel sounds, soft, non-tender, no palpable organomegaly; no palpable hernias Skin: Warm, dry; no sign of jaundice Psychiatric - alert and oriented x 4; calm mood and  affect    Assessment & Plan Rodman Key K. Demarie Hyneman MD; 02/26/2017 9:50 AM)  LEFT BREAST MASS (N63.20) Impression: 1.5 cm - inframammary crease  Current Plans Schedule for Surgery - Excision of left breast mass. The surgical procedure has been discussed with the patient. Potential risks, benefits, alternative treatments, and expected outcomes have been explained. All of the patient's questions at this time have been answered. The likelihood of reaching the patient's treatment goal is good. The patient understand the proposed surgical procedure and wishes to proceed.  [name]. Georgette Dover, MD, [facility]  General/ Trauma Surgery  02/26/2017 10:00 AM

## 2017-03-22 NOTE — Anesthesia Preprocedure Evaluation (Signed)
Anesthesia Evaluation  Patient identified by MRN, date of birth, ID band Patient awake    Reviewed: Allergy & Precautions, NPO status , Patient's Chart, lab work & pertinent test results  Airway Mallampati: II  TM Distance: >3 FB     Dental   Pulmonary sleep apnea ,    breath sounds clear to auscultation       Cardiovascular + Peripheral Vascular Disease and +CHF  + Valvular Problems/Murmurs  Rhythm:Regular Rate:Normal     Neuro/Psych  Neuromuscular disease    GI/Hepatic Neg liver ROS, hiatal hernia, GERD  ,  Endo/Other  negative endocrine ROS  Renal/GU negative Renal ROS     Musculoskeletal  (+) Arthritis , Fibromyalgia -  Abdominal   Peds  Hematology   Anesthesia Other Findings   Reproductive/Obstetrics                             Anesthesia Physical Anesthesia Plan  ASA: III  Anesthesia Plan: General   Post-op Pain Management:    Induction: Intravenous  PONV Risk Score and Plan: 3 and Ondansetron, Dexamethasone, Midazolam, Propofol infusion and Treatment may vary due to age or medical condition  Airway Management Planned: Oral ETT  Additional Equipment:   Intra-op Plan:   Post-operative Plan: Extubation in OR  Informed Consent: I have reviewed the patients History and Physical, chart, labs and discussed the procedure including the risks, benefits and alternatives for the proposed anesthesia with the patient or authorized representative who has indicated his/her understanding and acceptance.   Dental advisory given  Plan Discussed with: CRNA and Anesthesiologist  Anesthesia Plan Comments:         Anesthesia Quick Evaluation

## 2017-03-22 NOTE — Transfer of Care (Signed)
Immediate Anesthesia Transfer of Care Note  Patient: [name]  Procedure(s) Performed: Procedure(s): EXCISION OF LEFT BREAST MASS (Left)  Patient Location: PACU  Anesthesia Type:General  Level of Consciousness: awake, alert , oriented and patient cooperative  Airway & Oxygen Therapy: Patient Spontanous Breathing and Patient connected to face mask oxygen  Post-op Assessment: Report given to RN, Post -op Vital signs reviewed and stable and Patient moving all extremities X 4  Post vital signs: stable  Last Vitals:  Vitals:   03/22/17 0859 03/22/17 1120  BP: (!) 132/102   Pulse: 76   Resp: 16   Temp: 36.8 C (P) 36.9 C  SpO2: 95%     Last Pain:  Vitals:   03/22/17 0859  TempSrc: Oral      Patients Stated Pain Goal: 4 (54/00/86 [id number])  Complications: No apparent anesthesia complications

## 2017-03-22 NOTE — Discharge Instructions (Signed)
[facility] °Office Phone Number [phone] ° °BREAST BIOPSY/ PARTIAL MASTECTOMY: POST OP INSTRUCTIONS ° °Always review your discharge instruction sheet given to you by the facility where your surgery was performed. ° °IF YOU HAVE DISABILITY OR FAMILY LEAVE FORMS, YOU MUST BRING THEM TO THE OFFICE FOR PROCESSING.  DO NOT GIVE THEM TO YOUR DOCTOR. ° °1. A prescription for pain medication may be given to you upon discharge.  Take your pain medication as prescribed, if needed.  If narcotic pain medicine is not needed, then you may take acetaminophen (Tylenol) or ibuprofen (Advil) as needed. °2. Take your usually prescribed medications unless otherwise directed °3. If you need a refill on your pain medication, please contact your pharmacy.  They will contact our office to request authorization.  Prescriptions will not be filled after 5pm or on week-ends. °4. You should eat very light the first 24 hours after surgery, such as soup, crackers, pudding, etc.  Resume your normal diet the day after surgery. °5. Most patients will experience some swelling and bruising in the breast.  Ice packs and a good support bra will help.  Swelling and bruising can take several days to resolve.  °6. It is common to experience some constipation if taking pain medication after surgery.  Increasing fluid intake and taking a stool softener will usually help or prevent this problem from occurring.  A mild laxative (Milk of Magnesia or Miralax) should be taken according to package directions if there are no bowel movements after 48 hours. °7. Unless discharge instructions indicate otherwise, you may remove your bandages 24-48 hours after surgery, and you may shower at that time.  You may have steri-strips (small skin tapes) in place directly over the incision.  These strips should be left on the skin for 7-10 days.  If your surgeon used skin glue on the incision, you may shower in 24 hours.  The glue will flake off over the  next 2-3 weeks.  Any sutures or staples will be removed at the office during your follow-up visit. °8. ACTIVITIES:  You may resume regular daily activities (gradually increasing) beginning the next day.  Wearing a good support bra or sports bra minimizes pain and swelling.  You may have sexual intercourse when it is comfortable. °a. You may drive when you no longer are taking prescription pain medication, you can comfortably wear a seatbelt, and you can safely maneuver your car and apply brakes. °b. RETURN TO WORK:  ______________________________________________________________________________________ °9. You should see your doctor in the office for a follow-up appointment approximately two weeks after your surgery.  Your doctor’s nurse will typically make your follow-up appointment when she calls you with your pathology report.  Expect your pathology report 2-3 business days after your surgery.  You may call to check if you do not hear from us after three days. °10. OTHER INSTRUCTIONS: _______________________________________________________________________________________________ _____________________________________________________________________________________________________________________________________ °_____________________________________________________________________________________________________________________________________ °_____________________________________________________________________________________________________________________________________ ° °WHEN TO CALL YOUR DOCTOR: °1. Fever over 101.0 °2. Nausea and/or vomiting. °3. Extreme swelling or bruising. °4. Continued bleeding from incision. °5. Increased pain, redness, or drainage from the incision. ° °The clinic staff is available to answer your questions during regular business hours.  Please don’t hesitate to call and ask to speak to one of the nurses for clinical concerns.  If you have a medical emergency, go to the nearest  emergency room or call 911.  A surgeon from [facility] is always on call at the hospital. ° °For further questions, please visit [email or web address]  ° ° °  General Anesthesia, Adult, Care After These instructions provide you with information about caring for yourself after your procedure. Your health care provider may also give you more specific instructions. Your treatment has been planned according to current medical practices, but problems sometimes occur. Call your health care provider if you have any problems or questions after your procedure. What can I expect after the procedure? After the procedure, it is common to have:  Vomiting.  A sore throat.  Mental slowness.  It is common to feel:  Nauseous.  Cold or shivery.  Sleepy.  Tired.  Sore or achy, even in parts of your body where you did not have surgery.  Follow these instructions at home: For at least 24 hours after the procedure:  Do not: ? Participate in activities where you could fall or become injured. ? Drive. ? Use heavy machinery. ? Drink alcohol. ? Take sleeping pills or medicines that cause drowsiness. ? Make important decisions or sign legal documents. ? Take care of children on your own.  Rest. Eating and drinking  If you vomit, drink water, juice, or soup when you can drink without vomiting.  Drink enough fluid to keep your urine clear or pale yellow.  Make sure you have little or no nausea before eating solid foods.  Follow the diet recommended by your health care provider. General instructions  Have a responsible adult stay with you until you are awake and alert.  Return to your normal activities as told by your health care provider. Ask your health care provider what activities are safe for you.  Take over-the-counter and prescription medicines only as told by your health care provider.  If you smoke, do not smoke without supervision.  Keep all follow-up visits as  told by your health care provider. This is important. Contact a health care provider if:  You continue to have nausea or vomiting at home, and medicines are not helpful.  You cannot drink fluids or start eating again.  You cannot urinate after 8-12 hours.  You develop a skin rash.  You have fever.  You have increasing redness at the site of your procedure. Get help right away if:  You have difficulty breathing.  You have chest pain.  You have unexpected bleeding.  You feel that you are having a life-threatening or urgent problem. This information is not intended to replace advice given to you by your health care provider. Make sure you discuss any questions you have with your health care provider. Document Released: 11/06/2000 Document Revised: 01/03/2016 Document Reviewed: 07/15/2015 Elsevier Interactive Patient Education  [employer].

## 2017-03-23 ENCOUNTER — Encounter (HOSPITAL_COMMUNITY): Payer: Self-pay | Admitting: Surgery

## 2017-03-23 ENCOUNTER — Other Ambulatory Visit: Payer: PPO

## 2017-03-27 NOTE — Progress Notes (Signed)
Office Visit Note  Patient: [name]             Date of Birth: 02-Oct-1950           MRN: [id number]             PCP: Mellody Dance, DO Referring: Mellody Dance, DO Visit Date: 04/03/2017 Occupation: @GUAROCC @    Subjective:  Pain in hands and hips.   History of Present Illness: [name] is a 66 y.o. female with history of sero positive rheumatoid arthritis. She states she is doing quite well on combination therapy for rheumatoid arthritis. She has some discomfort in her hands and bilateral hips. She reports some swelling in her bilateral first MCP joints. Her Raynaud's get activated by exposure to cold temperatures. Dry mouth and dry eyes persist. Right total knee replacement is doing well. She is some discomfort in her left knee. She describes some discomfort over trochanteric area. She has some lower back pain which could be related to disc disease of lumbar spine. She has some generalized muscle pain. Any other concerns. Coronary stiff in the morning and 30  Activities of Daily Living:  Patient reports morning stiffness for  minutes.   Patient Denies nocturnal pain.  Difficulty dressing/grooming: Reports Difficulty climbing stairs: Denies Difficulty getting out of chair: Reports Difficulty using hands for taps, buttons, cutlery, and/or writing: Reports   Review of Systems  Constitutional: Negative for fatigue, night sweats, weight gain, weight loss and weakness.  HENT: Positive for mouth dryness. Negative for mouth sores, trouble swallowing, trouble swallowing and nose dryness.   Eyes: Positive for dryness. Negative for pain, redness and visual disturbance.  Respiratory: Negative for cough, shortness of breath and difficulty breathing.   Cardiovascular: Positive for palpitations. Negative for chest pain, hypertension, irregular heartbeat and swelling in legs/feet.       History  of PVCs   Gastrointestinal: Negative for blood in stool, constipation and  diarrhea.  Endocrine: Negative for increased urination.  Genitourinary: Negative for vaginal dryness.  Musculoskeletal: Positive for arthralgias, joint pain and morning stiffness. Negative for joint swelling, myalgias, muscle weakness, muscle tenderness and myalgias.  Skin: Positive for color change. Negative for rash, hair loss, skin tightness, ulcers and sensitivity to sunlight.  Allergic/Immunologic: Negative for susceptible to infections.  Neurological: Negative for dizziness, memory loss and night sweats.  Hematological: Negative for swollen glands.  Psychiatric/Behavioral: Positive for sleep disturbance. Negative for depressed mood. The patient is not nervous/anxious.     PMFS History:  Patient Active Problem List   Diagnosis Date Noted  . Raynaud's disease without gangrene 03/12/2017  . Abnormal weight gain 02/06/2017  . Shingles outbreak 02/06/2017  . Cyst (solitary) of breast, left 01/31/2017  . Eosinophilic esophagitis 03/70/4888  . History of hyperlipidemia 10/04/2016  . 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 History of diabetes mellitus 09/09/2016  . History of diverticulitis 09/03/2016  . Osteoporosis 09/03/2016  . Autoimmune disease ([place]) 09/02/2016  . High risk medication use 09/02/2016  . History of esophagitis 09/02/2016  . Elevated triglycerides with high cholesterol 08/23/2016  . Low serum HDL 08/23/2016  . Breast cyst, left 06/14/2016  . Encounter for wellness examination 05/23/2016  . Abnormality of gait 05/09/2016  . Hypokalemia 03/22/2016  . Sjoegren syndrome ([place]) 02/29/2016  . On potassium wasting diuretic therapy 02/29/2016  . GERD (gastroesophageal reflux disease) 01/19/2016  . Glucose intolerance (impaired glucose tolerance) 01/19/2016  . Chronic diastolic CHF (congestive heart failure) ([place]) 04/20/2015  . Peripheral  polyneuropathy 02/02/2014  . Morbid obesity ([place]) 11/17/2013  . Heart murmur   . OSA (obstructive sleep apnea)   . Rheumatoid  arthritis ([place])   . Hiatal hernia   . Fibromyalgia   . PVC's (premature ventricular contractions)   . 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 Unilateral primary osteoarthritis, left knee 06/22/2011    Past Medical History:  Diagnosis Date  . Arthritis    Rheumatoid arthritis,   . Blood transfusion    1981  . Chronic diastolic CHF (congestive heart failure) (HCC)    diastolic   . Fibromyalgia   . Heart murmur    as a child  . Hiatal hernia    sjorgens syndrome  . OSA (obstructive sleep apnea)   . Peripheral autonomic neuropathy of unknown cause     Family History  Problem Relation Age of Onset  . Alzheimer's disease Mother   . Heart attack Mother   . Hypertension Mother   . Glaucoma Mother   . Cancer Father   . Heart attack Brother   . Heart disease Brother   . Glaucoma Brother   . Hyperlipidemia Brother   . Glaucoma Brother    Past Surgical History:  Procedure Laterality Date  . ABDOMINAL HYSTERECTOMY     BTL, BSO  . BREAST SURGERY     mass removal   . CHOLECYSTECTOMY    . DIAGNOSTIC LAPAROSCOPY     x3  . KNEE ARTHROSCOPY     x2  . MASS EXCISION Left 03/22/2017   Procedure: EXCISION OF LEFT BREAST MASS;  Surgeon: Donnie Mesa, MD;  Location: [facility];  Service: General;  Laterality: Left;  . patotid cystectomy    . TENDON REPAIR  1980   left ankle and tibia  . TONSILLECTOMY    . TOTAL KNEE ARTHROPLASTY  06/21/2011   Procedure: TOTAL KNEE ARTHROPLASTY;  Surgeon: Gearlean Alf;  Location: [facility];  Service: Orthopedics;  Laterality: Right;  . TUBAL LIGATION     Social History   Social History Narrative   Lives at home alone   Right-handed   Drinks 1 or less cups of coffee and 2 servings of either tea or soda per day     Objective: Vital Signs: BP 120/78   Pulse 76   Resp 18   Ht 5\' 2"  (1.575 m)   Wt 223 lb (101.2 kg)   BMI 40.79 kg/m    Physical Exam  Constitutional: She is oriented to person, place, and time. She appears  well-developed and well-nourished.  HENT:  Head: Normocephalic and atraumatic.  Eyes: Conjunctivae and EOM are normal.  Neck: Normal range of motion.  Cardiovascular: Normal rate, regular rhythm, normal heart sounds and intact distal pulses.   Pulmonary/Chest: Effort normal and breath sounds normal.  Abdominal: Soft. Bowel sounds are normal.  Lymphadenopathy:    She has no cervical adenopathy.  Neurological: She is alert and oriented to person, place, and time.  Skin: Skin is warm and dry. Capillary refill takes less than 2 seconds.  Psychiatric: She has a normal mood and affect. Her behavior is normal.  Nursing note and vitals reviewed.    Musculoskeletal Exam: C-spine good range of motion. She has limited range of motion of thoracic lumbar spine with thoracolumbar scoliosis. Shoulder joints although joints wrist joints MCPs PIPs DIPs are good range of motion. She has thickening of MCPs PIPs DIP joints but no active synovitis. Hip joints are good range of motion. Her right total  knee replacement is doing well. She acute in her points.  CDAI Exam: CDAI Homunculus Exam:   Joint Counts:  CDAI Tender Joint count: 0 CDAI Swollen Joint count: 0  Global Assessments:  Patient Global Assessment: 3 Provider Global Assessment: 3  CDAI Calculated Score: 6    Investigation: Eye exam December 2017 No additional findings. CBC Latest Ref Rng & Units 03/20/2017 02/06/2017 10/13/2016  WBC 4.0 - 10.5 K/uL 5.8 5.2 4.4  Hemoglobin 12.0 - 15.0 g/dL 13.8 13.3 13.1  Hematocrit 36.0 - 46.0 % 40.3 42.2 40.4  Platelets 150 - 400 K/uL 177 185 171   CMP Latest Ref Rng & Units 03/22/2017 03/20/2017 02/22/2017  Glucose 65 - 99 mg/dL 122(H) 122(H) 92  BUN 6 - 20 mg/dL 9 13 12   Creatinine 0.44 - 1.00 mg/dL 0.67 0.69 0.65  Sodium 135 - 145 mmol/L 136 138 142  Potassium 3.5 - 5.1 mmol/L 3.4(L) 2.6(LL) 3.9  Chloride 101 - 111 mmol/L 95(L) 90(L) 97  CO2 22 - 32 mmol/L 29 34(H) 25  Calcium 8.9 - 10.3 mg/dL 9.2 9.3  9.8  Total Protein 6.0 - 8.5 g/dL - - -  Total Bilirubin 0.0 - 1.2 mg/dL - - -  Alkaline Phos 39 - 117 IU/L - - -  AST 0 - 40 IU/L - - -  ALT 0 - 32 IU/L - - -   Imaging: No results found.  Speciality Comments: No specialty comments available.    Procedures:  No procedures performed Allergies: Cymbalta [duloxetine hcl]; Demerol; Ivp dye [iodinated diagnostic agents]; Morphine and related; and Adhesive [tape]   Assessment / Plan:     Visit Diagnoses: Rheumatoid arthritis involving multiple sites with positive rheumatoid factor (HCC) - Positive RF, positive ANA. She has no synovitis on examination. She has some synovial thickening only.  High risk medication use - triple therapy Arava SSZ PLQ (Orencia IV discontinued due to cost) - Plan: COMPLETE METABOLIC PANEL WITH GFR as her potassium was low recently. Her next labs will be due in November and then every 3 months to monitor for drug toxicity.  Raynaud's disease without gangrene: She continues to have mild symptoms.  Sjogren's syndrome with keratoconjunctivitis sicca ([place]): Over-the-counter products were discussed.  Unilateral primary osteoarthritis, left knee: Chronic pain   History of total knee replacement, right - 2012: Doing well  DDD (degenerative disc disease), lumbar: Chronic pain  Fibromyalgia: She has minimal discomfort  Age-related osteoporosis without current pathological fracture - Status post Forteo. Treated with Reclast infusions in the past which were discontinued due to the cost. DXA 01/2017 showed T score of -1.8 right femoral neck   Other medical problems are listed as follows:[name]  History of diabetes mellitus  History of diverticulitis  History of eosinophilic esophagitis and Barretts esophagus  - Dr Collene Mares   History of hyperlipidemia  History of peripheral neuropathy  History of cardiac murmur/ history of PVC's  History of depression    Orders: Orders Placed This Encounter  Procedures  .  COMPLETE METABOLIC PANEL WITH GFR   No orders of the defined types were placed in this encounter.   Face-to-face time spent with patient was 30 minutes. Greater than 50% of time was spent in counseling and coordination of care.  Follow-Up Instructions: Return in about 5 months (around 09/03/2017) for Rheumatoid arthritis.   Bo Merino, MD  Note - This record has been created using Editor, commissioning.  Chart creation errors have been sought, but may not always  have been located.  Such creation errors do not reflect on  the standard of medical care.

## 2017-04-03 ENCOUNTER — Ambulatory Visit (INDEPENDENT_AMBULATORY_CARE_PROVIDER_SITE_OTHER): Payer: PPO | Admitting: Rheumatology

## 2017-04-03 ENCOUNTER — Encounter: Payer: Self-pay | Admitting: Rheumatology

## 2017-04-03 VITALS — BP 120/78 | HR 76 | Resp 18 | Ht 62.0 in | Wt 223.0 lb

## 2017-04-03 DIAGNOSIS — I7300 Raynaud's syndrome without gangrene: Secondary | ICD-10-CM | POA: Diagnosis not present

## 2017-04-03 DIAGNOSIS — M810 Age-related osteoporosis without current pathological fracture: Secondary | ICD-10-CM

## 2017-04-03 DIAGNOSIS — M51369 Other intervertebral disc degeneration, lumbar region without mention of lumbar back pain or lower extremity pain: Secondary | ICD-10-CM

## 2017-04-03 DIAGNOSIS — M3501 Sicca syndrome with keratoconjunctivitis: Secondary | ICD-10-CM

## 2017-04-03 DIAGNOSIS — L821 Other seborrheic keratosis: Secondary | ICD-10-CM | POA: Diagnosis not present

## 2017-04-03 DIAGNOSIS — Z8639 Personal history of other endocrine, nutritional and metabolic disease: Secondary | ICD-10-CM

## 2017-04-03 DIAGNOSIS — M5136 Other intervertebral disc degeneration, lumbar region: Secondary | ICD-10-CM | POA: Diagnosis not present

## 2017-04-03 DIAGNOSIS — D2211 Melanocytic nevi of right eyelid, including canthus: Secondary | ICD-10-CM | POA: Diagnosis not present

## 2017-04-03 DIAGNOSIS — M1712 Unilateral primary osteoarthritis, left knee: Secondary | ICD-10-CM | POA: Diagnosis not present

## 2017-04-03 DIAGNOSIS — D2262 Melanocytic nevi of left upper limb, including shoulder: Secondary | ICD-10-CM | POA: Diagnosis not present

## 2017-04-03 DIAGNOSIS — Z8669 Personal history of other diseases of the nervous system and sense organs: Secondary | ICD-10-CM | POA: Diagnosis not present

## 2017-04-03 DIAGNOSIS — Z79899 Other long term (current) drug therapy: Secondary | ICD-10-CM | POA: Diagnosis not present

## 2017-04-03 DIAGNOSIS — Z8719 Personal history of other diseases of the digestive system: Secondary | ICD-10-CM

## 2017-04-03 DIAGNOSIS — L700 Acne vulgaris: Secondary | ICD-10-CM | POA: Diagnosis not present

## 2017-04-03 DIAGNOSIS — M0579 Rheumatoid arthritis with rheumatoid factor of multiple sites without organ or systems involvement: Secondary | ICD-10-CM

## 2017-04-03 DIAGNOSIS — L720 Epidermal cyst: Secondary | ICD-10-CM | POA: Diagnosis not present

## 2017-04-03 DIAGNOSIS — Z8659 Personal history of other mental and behavioral disorders: Secondary | ICD-10-CM

## 2017-04-03 DIAGNOSIS — M797 Fibromyalgia: Secondary | ICD-10-CM

## 2017-04-03 DIAGNOSIS — Z85828 Personal history of other malignant neoplasm of skin: Secondary | ICD-10-CM | POA: Diagnosis not present

## 2017-04-03 DIAGNOSIS — D225 Melanocytic nevi of trunk: Secondary | ICD-10-CM | POA: Diagnosis not present

## 2017-04-03 DIAGNOSIS — Z96651 Presence of right artificial knee joint: Secondary | ICD-10-CM

## 2017-04-03 DIAGNOSIS — L570 Actinic keratosis: Secondary | ICD-10-CM | POA: Diagnosis not present

## 2017-04-03 DIAGNOSIS — Z8679 Personal history of other diseases of the circulatory system: Secondary | ICD-10-CM

## 2017-04-03 DIAGNOSIS — D2272 Melanocytic nevi of left lower limb, including hip: Secondary | ICD-10-CM | POA: Diagnosis not present

## 2017-04-03 NOTE — Progress Notes (Signed)
Rheumatology Medication Review by a Pharmacist Does the patient feel that his/her medications are working for him/her?  Yes Has the patient been experiencing any side effects to the medications prescribed?  No Does the patient have any problems obtaining medications?  No  Issues to address at subsequent visits: None   Pharmacist comments:  [name] is a pleasant 65 yo F who presents for follow up of rheumatoid arthritis.  She is currently taking leflunomide 20 mg daily, sulfasalazine 1000 mg BID, and hydroxychloroquine 200 mg in the morning and 100 mg in the evening.  Her most recent CBC was normal on 03/20/17.  Most recent BMP was normal on 03/22/17 except for potassium of 3.4.  Patient requests repeat CMP today.  She will be due for standing labs again in November 2018 and every 3 months.  Patient had eye exam in November 2017 and was scheduled to get visual field exam in December.  I spoke to [name] at [facility] who confirms patient had visual field exam on 08/08/16.  She will fax results to our office.  Patient confirms she already has eye exam scheduled for this year.  Patient denies any questions or concerns regarding her medications at this time.   Elisabeth Most, Pharm.D., BCPS, CPP Clinical Pharmacist Pager: [phone] Phone: [phone] 04/03/2017 10:36 AM

## 2017-04-03 NOTE — Patient Instructions (Addendum)
Standing Labs We placed an order today for your standing lab work.    Please come back and get your standing labs in November 2018 and every 3 months.  We have open lab Monday through Friday from 8:30-11:30 AM and 1:30-4 PM at the office of Dr. Bo Merino.   The office is located at [address], [place], [place], [place] [address] No appointment is necessary.   Labs are drawn by [facility].  You may receive a bill from [facility] for your lab work. If you have any questions regarding directions or hours of operation,  please call [phone].     [facility] [address], [place], [place], [place] [address] Telephone: [phone]  Fax: [phone]  Our mutual patient _____________________ (DOB: _____ / _____ / _____) is in need of a BASELINE hydroxychloroquine (Plaquenil) toxicity eye exam or FOLLOW UP Plaquenil eye exam.   Please fill out the form below and fax to our office when the Plaquenil eye exam is completed.   Thank you for your assistance in this matter.   Sincerely,   Bo Merino, MD         Date of Plaquenil Toxicity Eye Exam: _____ / _____ / _____  Plaquenil Toxicity Eye Exam Was:   Normal   ABNORMAL  Plaquenil Should Be:     Continued  DISCONTINUED  Date of Follow-up Plaquenil Eye Exam:  6 mo.          12 mo.     Other: ___ / ___ / ___    Practice Name:       Name of Eye Doctor:      ___________________________   ___________________________     Practice Phone Number:     Signature of Eye Doctor:      ___________________________              ___________________________

## 2017-04-04 ENCOUNTER — Telehealth: Payer: Self-pay | Admitting: Cardiology

## 2017-04-04 DIAGNOSIS — I5032 Chronic diastolic (congestive) heart failure: Secondary | ICD-10-CM

## 2017-04-04 LAB — COMPLETE METABOLIC PANEL WITH GFR
ALBUMIN: 4.1 g/dL (ref 3.6–5.1)
ALK PHOS: 104 U/L (ref 33–130)
ALT: 14 U/L (ref 6–29)
AST: 18 U/L (ref 10–35)
BUN: 14 mg/dL (ref 7–25)
CO2: 27 mmol/L (ref 20–32)
Calcium: 9.1 mg/dL (ref 8.6–10.4)
Chloride: 94 mmol/L — ABNORMAL LOW (ref 98–110)
Creat: 0.66 mg/dL (ref 0.50–0.99)
GFR, Est African American: >89 mL/min (ref >=60)
GLUCOSE: 135 mg/dL — AB (ref 65–99)
Potassium: 3 mmol/L — ABNORMAL LOW (ref 3.5–5.3)
SODIUM: 137 mmol/L (ref 135–146)
Total Bilirubin: 0.5 mg/dL (ref 0.2–1.2)
Total Protein: 6.4 g/dL (ref 6.1–8.1)

## 2017-04-04 NOTE — Telephone Encounter (Signed)
[name] is calling about her potassium . Her potassium is 3.0 and wants to know if she needs to up her potassium .   Please call on either home or cell phone , If after 4 call the home number

## 2017-04-04 NOTE — Progress Notes (Signed)
Potassium is low, please notify patient and fax results to her PCP

## 2017-04-04 NOTE — Telephone Encounter (Signed)
Left message for patient to get in touch with her PCP ASAP for K instructions.   Will call in the AM to follow up.

## 2017-04-05 MED ORDER — KLOR-CON 10 10 MEQ PO TBCR: 20.0000 meq | EXTENDED_RELEASE_TABLET | ORAL | 3 refills | Status: DC

## 2017-04-05 NOTE — Telephone Encounter (Signed)
Spoke with Dr. Raliegh Scarlet about patient's results. Informed her patient would be called with instructions.  Confirmed with patient she is taking Lasix 40 mg daily, Kdur 20 meq daily, and metolazone 2.5 mg qod. Instructed patient to take an extra 40 meq of Kdur  NOW and an extra 20 meq in the morning with her AM dose. Instructed patient to INCREASE KDUR to 40 meq on the days she takes metolazone and continue 20 meq days she does not after that.  BMET scheduled Monday. Patient agrees with treatment plan.

## 2017-04-09 ENCOUNTER — Ambulatory Visit: Payer: PPO | Admitting: Family Medicine

## 2017-04-09 ENCOUNTER — Other Ambulatory Visit: Payer: PPO | Admitting: *Deleted

## 2017-04-09 DIAGNOSIS — I5032 Chronic diastolic (congestive) heart failure: Secondary | ICD-10-CM

## 2017-04-10 ENCOUNTER — Telehealth: Payer: Self-pay

## 2017-04-10 DIAGNOSIS — I5032 Chronic diastolic (congestive) heart failure: Secondary | ICD-10-CM

## 2017-04-10 LAB — BASIC METABOLIC PANEL
BUN / CREAT RATIO: 22 (ref 12–28)
BUN: 18 mg/dL (ref 8–27)
CO2: 22 mmol/L (ref 20–29)
CREATININE: 0.83 mg/dL (ref 0.57–1.00)
Calcium: 9.2 mg/dL (ref 8.7–10.3)
Chloride: 98 mmol/L (ref 96–106)
GFR, EST AFRICAN AMERICAN: 86 mL/min/{1.73_m2} (ref >59)
GFR, EST NON AFRICAN AMERICAN: 75 mL/min/{1.73_m2} (ref >59)
GLUCOSE: 163 mg/dL — AB (ref 65–99)
Potassium: 3.3 mmol/L — ABNORMAL LOW (ref 3.5–5.2)
SODIUM: 142 mmol/L (ref 134–144)

## 2017-04-10 MED ORDER — KLOR-CON 10 10 MEQ PO TBCR: 40.0000 meq | EXTENDED_RELEASE_TABLET | Freq: Every day | ORAL | 3 refills | Status: DC

## 2017-04-10 NOTE — Telephone Encounter (Signed)
Informed patient of results and verbal understanding expressed.  Instructed patient to INCREASE KDUR to 40 meq daily. Per Dr. Radford Pax, instructed her to take an extra Kdur 40 meq now. BMET scheduled 9/4. Patient agrees with treatment plan.

## 2017-04-10 NOTE — Telephone Encounter (Signed)
-----   Message from Sueanne Margarita, MD sent at 04/10/2017 10:45 AM EDT ----- Increase Kdur to 59meq daily and repeat BMET in 1 weeks

## 2017-04-17 ENCOUNTER — Other Ambulatory Visit: Payer: PPO

## 2017-04-18 ENCOUNTER — Other Ambulatory Visit: Payer: PPO

## 2017-04-18 DIAGNOSIS — G4733 Obstructive sleep apnea (adult) (pediatric): Secondary | ICD-10-CM | POA: Diagnosis not present

## 2017-04-18 DIAGNOSIS — R269 Unspecified abnormalities of gait and mobility: Secondary | ICD-10-CM | POA: Diagnosis not present

## 2017-04-20 ENCOUNTER — Other Ambulatory Visit: Payer: PPO | Admitting: *Deleted

## 2017-04-20 DIAGNOSIS — I5032 Chronic diastolic (congestive) heart failure: Secondary | ICD-10-CM | POA: Diagnosis not present

## 2017-04-21 LAB — BASIC METABOLIC PANEL
BUN / CREAT RATIO: 22 (ref 12–28)
BUN: 13 mg/dL (ref 8–27)
CALCIUM: 9.8 mg/dL (ref 8.7–10.3)
CHLORIDE: 97 mmol/L (ref 96–106)
CO2: 25 mmol/L (ref 20–29)
Creatinine, Ser: 0.6 mg/dL (ref 0.57–1.00)
GFR calc non Af Amer: 97 mL/min/{1.73_m2} (ref >59)
GFR, EST AFRICAN AMERICAN: 111 mL/min/{1.73_m2} (ref >59)
Glucose: 95 mg/dL (ref 65–99)
POTASSIUM: 3.3 mmol/L — AB (ref 3.5–5.2)
Sodium: 142 mmol/L (ref 134–144)

## 2017-04-23 ENCOUNTER — Telehealth: Payer: Self-pay

## 2017-04-23 DIAGNOSIS — I5032 Chronic diastolic (congestive) heart failure: Secondary | ICD-10-CM

## 2017-04-23 MED ORDER — KLOR-CON 10 10 MEQ PO TBCR: 40.0000 meq | EXTENDED_RELEASE_TABLET | Freq: Two times a day (BID) | ORAL | 3 refills | Status: DC

## 2017-04-23 NOTE — Telephone Encounter (Signed)
Instructed patient to INCREASE KDUR to 40 meq BID. She cannot come Friday for blood work. She will come Tuesday for repeat BMET. She was grateful for assistance.

## 2017-04-23 NOTE — Telephone Encounter (Signed)
-----   Message from Sueanne Margarita, MD sent at 04/23/2017  5:02 PM EDT ----- Increase Kdur to 36meq BID and repeat BMET Friday

## 2017-04-30 ENCOUNTER — Telehealth: Payer: Self-pay | Admitting: Rheumatology

## 2017-04-30 MED ORDER — PREDNISONE 5 MG PO TABS: ORAL_TABLET | ORAL | 0 refills | Status: DC

## 2017-04-30 MED ORDER — LIDOCAINE 5 % EX PTCH: 1.0000 | MEDICATED_PATCH | CUTANEOUS | 0 refills | Status: DC

## 2017-04-30 NOTE — Telephone Encounter (Signed)
Patient calling due to back/ bil. hip issue x2 weeks. Patient requesting rx for Prednisone, and box of Lidocaine patches. Patient has done everything she can think of to help, with no relief. Patient uses [facility] on [address]. Please call patient to advise.

## 2017-04-30 NOTE — Telephone Encounter (Signed)
Patient states she is having flare for about 2 weeks. Patient states she is having pain in her lower back and bilateral hips. Patient states she has tried her Hydrocodone at bedtime and the Zanaflex. Patient states she is having trouble with sleeping and her activities. Patient is not getting any relief with anything she has tried. Patient is requesting a prescription for Prednisone and Lidocaine patches. Please advise.

## 2017-04-30 NOTE — Telephone Encounter (Signed)
Pred taper 5mg  15mg  x2d, 10 mg x2d, 5mg  x2d, 2.5 mg x2d . Ok to fill Lidoderm patch

## 2017-04-30 NOTE — Telephone Encounter (Signed)
Patient advised prescription sent to the pharmacy.  

## 2017-05-01 ENCOUNTER — Other Ambulatory Visit: Payer: PPO

## 2017-05-01 ENCOUNTER — Telehealth: Payer: Self-pay

## 2017-05-01 DIAGNOSIS — I5032 Chronic diastolic (congestive) heart failure: Secondary | ICD-10-CM

## 2017-05-01 NOTE — Telephone Encounter (Signed)
Received a fax from [facility] regarding a prior authorization DENIAL for Lidocaine 5% Patch.   Reference [id number] Phone [phone]  Will send document to scan center.  Called patient to update her. Left message.   Khamari Sheehan, [place], CPhT 3:05 PM

## 2017-05-01 NOTE — Telephone Encounter (Signed)
A prior authorization for Lidocain 5% patches has been submitted to pt insurance via cover my meds. Will update once we receive a response.   Svetlana Bagby, [place], CPhT 10:26 AM

## 2017-05-02 ENCOUNTER — Telehealth: Payer: Self-pay

## 2017-05-02 DIAGNOSIS — E876 Hypokalemia: Secondary | ICD-10-CM

## 2017-05-02 LAB — BASIC METABOLIC PANEL
BUN / CREAT RATIO: 24 (ref 12–28)
BUN: 16 mg/dL (ref 8–27)
CALCIUM: 9.2 mg/dL (ref 8.7–10.3)
CHLORIDE: 96 mmol/L (ref 96–106)
CO2: 26 mmol/L (ref 20–29)
CREATININE: 0.67 mg/dL (ref 0.57–1.00)
GFR calc Af Amer: 107 mL/min/{1.73_m2} (ref >59)
GFR calc non Af Amer: 93 mL/min/{1.73_m2} (ref >59)
GLUCOSE: 164 mg/dL — AB (ref 65–99)
Potassium: 3 mmol/L — ABNORMAL LOW (ref 3.5–5.2)
Sodium: 142 mmol/L (ref 134–144)

## 2017-05-02 NOTE — Telephone Encounter (Signed)
-----   Message from Sueanne Margarita, MD sent at 05/02/2017 10:05 AM EDT ----- Please refer to nephrology for persistent hypokalemia - please document again how much Kdur she is taking

## 2017-05-02 NOTE — Telephone Encounter (Signed)
Pt aware of lab results and Dr.Turner's recommendation.  "Please refer to nephrology for persistent hypokalemia - please document again how much Kdur she is taking"  Pt verifies that she is taking K-Dur 40 meq bid Pt is agreeable with ref to nephrology. Pt rqst to see Dr.Goldsborough @ [facility]. Ref placed in epic. Pt would like to know what she needs to do in the meantime. She has a upcoming trip planned and she will be out of town for 1 week, she is concerned that her potassium levels will continue to drop, and wants to know if it is ok to travel. Adv her that I will fwd her concern to Dr.Turner and call back with her response. Pt verbalized understanding and agreeable with plan

## 2017-05-07 ENCOUNTER — Other Ambulatory Visit: Payer: Self-pay | Admitting: *Deleted

## 2017-05-07 ENCOUNTER — Other Ambulatory Visit: Payer: Self-pay | Admitting: Cardiology

## 2017-05-07 DIAGNOSIS — E876 Hypokalemia: Secondary | ICD-10-CM

## 2017-05-07 NOTE — Telephone Encounter (Signed)
Medication Detail    Disp Refills Start End   metolazone (ZAROXOLYN) 2.5 MG tablet 15 tablet 6 02/23/2017    Sig: TAKE 1 TABLET BY MOUTH 30 MINS BEFORE LASIX EVERY OTHER DAY   Sent to pharmacy as: metolazone (ZAROXOLYN) 2.5 MG tablet   E-Prescribing Status: Receipt confirmed by pharmacy (02/23/2017 10:25 AM EDT)   Pharmacy   [facility], [place]

## 2017-05-16 ENCOUNTER — Ambulatory Visit (INDEPENDENT_AMBULATORY_CARE_PROVIDER_SITE_OTHER): Payer: PPO | Admitting: Neurology

## 2017-05-16 ENCOUNTER — Encounter: Payer: Self-pay | Admitting: Neurology

## 2017-05-16 ENCOUNTER — Other Ambulatory Visit: Payer: PPO

## 2017-05-16 ENCOUNTER — Ambulatory Visit (INDEPENDENT_AMBULATORY_CARE_PROVIDER_SITE_OTHER): Payer: PPO | Admitting: Cardiology

## 2017-05-16 ENCOUNTER — Encounter: Payer: Self-pay | Admitting: Cardiology

## 2017-05-16 VITALS — BP 118/74 | HR 80 | Ht 62.0 in | Wt 229.0 lb

## 2017-05-16 VITALS — BP 118/62 | HR 89 | Ht 62.0 in

## 2017-05-16 DIAGNOSIS — M797 Fibromyalgia: Secondary | ICD-10-CM | POA: Diagnosis not present

## 2017-05-16 DIAGNOSIS — G629 Polyneuropathy, unspecified: Secondary | ICD-10-CM | POA: Diagnosis not present

## 2017-05-16 DIAGNOSIS — G4733 Obstructive sleep apnea (adult) (pediatric): Secondary | ICD-10-CM | POA: Diagnosis not present

## 2017-05-16 DIAGNOSIS — E876 Hypokalemia: Secondary | ICD-10-CM

## 2017-05-16 DIAGNOSIS — I493 Ventricular premature depolarization: Secondary | ICD-10-CM

## 2017-05-16 DIAGNOSIS — I5032 Chronic diastolic (congestive) heart failure: Secondary | ICD-10-CM

## 2017-05-16 MED ORDER — SPIRONOLACTONE 25 MG PO TABS: 25.0000 mg | ORAL_TABLET | Freq: Every day | ORAL | 1 refills | Status: DC

## 2017-05-16 MED ORDER — TORSEMIDE 20 MG PO TABS: 20.0000 mg | ORAL_TABLET | Freq: Two times a day (BID) | ORAL | 1 refills | Status: DC

## 2017-05-16 NOTE — Progress Notes (Signed)
Cardiology Office Note:    Date:  05/16/2017   ID:  [name], DOB 05/31/1951, MRN [id number]  PCP:  Mellody Dance, DO  Cardiologist:  Fransico Him, MD   Referring MD: Mellody Dance, DO   Chief Complaint  Patient presents with  . Congestive Heart Failure  . Cardiomyopathy  . Sleep Apnea    History of Present Illness:    [name] is a 66 y.o. female with a hx of  PVC's, chronic diastolic CHF, DCM with normalization of LVF with EF 60-65%, obesity and OSA on CPAP.  She has had some problems with her potassium being low despite significant potassium repletion.    She is here today for followup and is doing well but just got back from [facility] and now is 9lbs up and has had increased LE edema.  She ate out a lot in [place].  She denies any anginal chest pain or pressure, SOB, DOE, PND, orthopnea,  Dizziness, or syncope. She occasionally will feel her PVCs. She has chronic LE edema which is controlled on diuretics.  She is compliant with her meds and is tolerating meds with no SE.  She is doing well with her CPAP device and thinks that she has gotten used to it.  She tolerates the full face mask and feels the pressure is adequate.  She is sleepy some during the day but she is not sleeping well due to severe back pain and waking up several times weekly.   She has Sjogrens so she has chronic mouth dryness.  Past Medical History:  Diagnosis Date  . Arthritis    Rheumatoid arthritis,   . Blood transfusion    1981  . Chronic diastolic CHF (congestive heart failure) (HCC)    diastolic   . Fibromyalgia   . Heart murmur    as a child  . Hiatal hernia    sjorgens syndrome  . OSA (obstructive sleep apnea)   . Peripheral autonomic neuropathy of unknown cause     Past Surgical History:  Procedure Laterality Date  . ABDOMINAL HYSTERECTOMY     BTL, BSO  . BREAST SURGERY     mass removal   . CHOLECYSTECTOMY    . DIAGNOSTIC LAPAROSCOPY     x3  . KNEE ARTHROSCOPY     x2    . MASS EXCISION Left 03/22/2017   Procedure: EXCISION OF LEFT BREAST MASS;  Surgeon: Donnie Mesa, MD;  Location: [facility];  Service: General;  Laterality: Left;  . patotid cystectomy    . TENDON REPAIR  1980   left ankle and tibia  . TONSILLECTOMY    . TOTAL KNEE ARTHROPLASTY  06/21/2011   Procedure: TOTAL KNEE ARTHROPLASTY;  Surgeon: Gearlean Alf;  Location: [facility];  Service: Orthopedics;  Laterality: Right;  . TUBAL LIGATION      Current Medications: Current Meds  Medication Sig  . carbamazepine (TEGRETOL) 200 MG tablet 1/2 tablet in the morning and 1 tablet in the evening (Patient taking differently: Take 100-200 mg by mouth 2 (two) times daily. 1/2 tablet in the morning and 1 tablet in the evening)  . Cholecalciferol (VITAMIN D3) 5000 units TABS Take 5,000 Units by mouth daily.   . cycloSPORINE (RESTASIS) 0.05 % ophthalmic emulsion Place 2 drops into both eyes 2 (two) times daily.   . diphenoxylate-atropine (LOMOTIL) 2.5-0.025 MG per tablet Take 1 tablet by mouth 4 (four) times daily as needed for diarrhea or loose stools.  . gabapentin (NEURONTIN) 300 MG  capsule Take 600-900 mg by mouth 2 (two) times daily. Take 2 capsules in the morning and 3 capsules at bedtime   . HYDROcodone-acetaminophen (NORCO/VICODIN) 5-325 MG tablet Take 1 tablet by mouth at bedtime as needed.  . hydroxychloroquine (PLAQUENIL) 200 MG tablet TAKE 1 TABLET BY MOUTH EVERY MORNING AND 1/2 TABLET AT BEDTIME.  . hyoscyamine (LEVBID) 0.375 MG 12 hr tablet Take 0.375 mg by mouth 2 (two) times daily as needed (for abdominal cramping/pain.).   [name] KLOR-CON 10 10 MEQ tablet Take 4 tablets (40 mEq total) by mouth 2 (two) times daily.  [name] leflunomide (ARAVA) 20 MG tablet TAKE 1 TABLET (20 MG TOTAL) BY MOUTH DAILY.  [name] lidocaine (LIDODERM) 5 % Place 1 patch onto the skin daily. Remove & Discard patch within 12 hours or as directed by MD  . metolazone (ZAROXOLYN) 2.5 MG tablet TAKE 1 TABLET BY MOUTH 30 MINS BEFORE LASIX EVERY  OTHER DAY  . metoprolol succinate (TOPROL-XL) 25 MG 24 hr tablet TAKE 1 TABLET BY MOUTH DAILY. (Patient taking differently: TAKE 1 TABLET BY MOUTH DAILY AT NIGHT)  . Multiple Vitamin (MULTIVITAMIN WITH MINERALS) TABS tablet Take 1 tablet by mouth daily.  . pantoprazole (PROTONIX) 40 MG tablet Take 40 mg by mouth daily before breakfast.   . Probiotic Product ([place]) 170 MG CAPS Take 1 capsule by mouth every morning.  . promethazine (PHENERGAN) 25 MG tablet TAKE 1 TABLET BY MOUTH EVERY 6 HOURS AS NEEDED. (Patient taking differently: TAKE 1 TABLET BY MOUTH EVERY 6 HOURS AS NEEDED FOR NAUSEA/VOMITING)  . silver sulfADIAZINE (SILVADENE) 1 % cream Apply 1 application topically daily as needed (for skin infection/irritation.).   [name] sulfaSALAzine (AZULFIDINE) 500 MG EC tablet TAKE 2 TABLETS BY MOUTH 2 TIMES A DAY.  [name] tiZANidine (ZANAFLEX) 4 MG tablet TAKE 1 TABLET BY MOUTH DAILY AT BEDTIME  . triamcinolone ointment (KENALOG) 0.1 % To affected areas of itching Three times daily (Patient taking differently: Apply 1 application topically 3 (three) times daily as needed (FOR ITCHY SKIN.). )     Allergies:   Cymbalta [duloxetine hcl]; Demerol; Ivp dye [iodinated diagnostic agents]; Morphine and related; and Adhesive [tape]   Social History   Social History  . Marital status: Married    Spouse name: N/A  . Number of children: 2  . Years of education: AS   Occupational History  . disability   . RN    Social History Main Topics  . Smoking status: Never Smoker  . Smokeless tobacco: Never Used  . Alcohol use Yes     Comment: less than 1 month  . Drug use: No  . Sexual activity: No   Other Topics Concern  . None   Social History Narrative   Lives at home alone   Right-handed   Drinks 1 or less cups of coffee and 2 servings of either tea or soda per day     Family History: The patient's family history includes Alzheimer's disease in her mother; Cancer in her father; Glaucoma  in her brother, brother, and mother; Heart attack in her brother and mother; Heart disease in her brother; Hyperlipidemia in her brother; Hypertension in her mother.  ROS:   Please see the history of present illness.    ROS  All other systems reviewed and negative.   EKGs/Labs/Other Studies Reviewed:    The following studies were reviewed today: CPAP download  EKG:  EKG is not ordered today.    Recent Labs: 05/23/2016: TSH  0.90 03/20/2017: Hemoglobin 13.8; Platelets 177 04/03/2017: ALT 14 05/01/2017: BUN 16; Creatinine, Ser 0.67; Potassium 3.0; Sodium 142   Recent Lipid Panel    Component Value Date/Time   CHOL 201 (H) 08/11/2016 1017   TRIG 293 (H) 08/11/2016 1017   HDL 43 (L) 08/11/2016 1017   CHOLHDL 4.7 08/11/2016 1017   VLDL 59 (H) 08/11/2016 1017   LDLCALC 99 08/11/2016 1017    Physical Exam:    VS:  BP 118/62   Pulse 89   Ht 5\' 2"  (1.575 m)   SpO2 94%     Wt Readings from Last 3 Encounters:  04/03/17 223 lb (101.2 kg)  03/22/17 221 lb (100.2 kg)  03/20/17 221 lb 6.4 oz (100.4 kg)     GEN:  Well nourished, well developed in no acute distress HEENT: Normal NECK: No JVD; No carotid bruits LYMPHATICS: No lymphadenopathy CARDIAC: RRR, no murmurs, rubs, gallops RESPIRATORY:  Clear to auscultation without rales, wheezing or rhonchi  ABDOMEN: Soft, non-tender, non-distended MUSCULOSKELETAL:  No edema; No deformity  SKIN: Warm and dry4.3 NEUROLOGIC:  Alert and oriented x 3 PSYCHIATRIC:  Normal affect   ASSESSMENT:    1. Chronic diastolic CHF (congestive heart failure) ([place])   2. PVC's (premature ventricular contractions)   3. OSA (obstructive sleep apnea)   4. Morbid obesity ([place])    PLAN:    In order of problems listed above:  1.  Chronic diastolic CHF - she appears volume overloaded today.  She is up 10lbs in the past week and feels it in her abdomen and has LE edema.  The Lasix dose not work well for her even with the metolazone.  I have recommended  changing her to Demadex 20mg  BID to see if she has better absorption and add aldactone 25mg  daily as additional diuretic which will help retain K+ since we are having a hard time keep her potassium repleted on diuretic. She will stop the Metolazone.  Stressed again the importance of a low sodium diet and encouraged to limit sodium to 1500mg  daily.  I will check a BMET today and in 1 week as well as the PA.  2.  PVC's - her PVCs are well suppressed on BB.  3.  OSA - the patient is tolerating PAP therapy well without any problems. The PAP download was reviewed today and showed an AHI of 4.3/hr on 12cm H2O with 100% compliance in using more than 4 hours nightly.  The patient has been using and benefiting from CPAP use and will continue to benefit from therapy.   4.  Morbid obesity - her exercise is limited by orthopedic problems.    Medication Adjustments/Labs and Tests Ordered: Current medicines are reviewed at length with the patient today.  Concerns regarding medicines are outlined above.  No orders of the defined types were placed in this encounter.  No orders of the defined types were placed in this encounter.   Signed, Fransico Him, MD  05/16/2017 1:19 PM    [facility]

## 2017-05-16 NOTE — Progress Notes (Signed)
Reason for visit: Peripheral neuropathy  [name] is an 66 y.o. female  History of present illness:  [name] is a 66 year old right-handed white female with a history of rheumatoid arthritis and fibromyalgia as well as a peripheral neuropathy. The patient has had lancinating pains associated with her neuropathy that has been improved significantly by the use of carbamazepine and gabapentin. She is on the 200 mg tablet of carbamazepine taking one half tablet in the morning and at full tablet at night. She takes 600 mg of gabapentin in the morning and 900 mg at night. These medications have offered good benefit with her discomfort. The patient is sleeping fairly well. She does have some mild gait instability, she denies any recent falls. She may occasionally use a cane when outside the house. She returns to this office for further evaluation.  Past Medical History:  Diagnosis Date  . Arthritis    Rheumatoid arthritis,   . Blood transfusion    1981  . Chronic diastolic CHF (congestive heart failure) (HCC)    diastolic   . Fibromyalgia   . Heart murmur    as a child  . Hiatal hernia    sjorgens syndrome  . OSA (obstructive sleep apnea)   . Peripheral autonomic neuropathy of unknown cause     Past Surgical History:  Procedure Laterality Date  . ABDOMINAL HYSTERECTOMY     BTL, BSO  . BREAST SURGERY     mass removal   . CHOLECYSTECTOMY    . DIAGNOSTIC LAPAROSCOPY     x3  . KNEE ARTHROSCOPY     x2  . MASS EXCISION Left 03/22/2017   Procedure: EXCISION OF LEFT BREAST MASS;  Surgeon: Donnie Mesa, MD;  Location: [facility];  Service: General;  Laterality: Left;  . patotid cystectomy    . TENDON REPAIR  1980   left ankle and tibia  . TONSILLECTOMY    . TOTAL KNEE ARTHROPLASTY  06/21/2011   Procedure: TOTAL KNEE ARTHROPLASTY;  Surgeon: Gearlean Alf;  Location: [facility];  Service: Orthopedics;  Laterality: Right;  . TUBAL LIGATION      Family History  Problem Relation  Age of Onset  . Alzheimer's disease Mother   . Heart attack Mother   . Hypertension Mother   . Glaucoma Mother   . Cancer Father   . Heart attack Brother   . Heart disease Brother   . Glaucoma Brother   . Hyperlipidemia Brother   . Glaucoma Brother     Social history:  reports that she has never smoked. She has never used smokeless tobacco. She reports that she drinks alcohol. She reports that she does not use drugs.    Allergies  Allergen Reactions  . Cymbalta [Duloxetine Hcl]     manic  . Demerol Hives and Other (See Comments)    Fever   . Ivp Dye [Iodinated Diagnostic Agents] Hives and Other (See Comments)    Fever   . Morphine And Related Other (See Comments)    ineffective  . Adhesive [Tape] Rash    Medications:  Prior to Admission medications   Medication Sig Start Date End Date Taking? Authorizing Provider  carbamazepine (TEGRETOL) 200 MG tablet 1/2 tablet in the morning and 1 tablet in the evening Patient taking differently: Take 100-200 mg by mouth 2 (two) times daily. 1/2 tablet in the morning and 1 tablet in the evening 12/19/16  Yes Kathrynn Ducking, MD  Cholecalciferol (VITAMIN D3) 5000 units TABS  Take 5,000 Units by mouth daily.    Yes [provider]  cycloSPORINE (RESTASIS) 0.05 % ophthalmic emulsion Place 2 drops into both eyes 2 (two) times daily.    Yes [provider]  diphenoxylate-atropine (LOMOTIL) 2.5-0.025 MG per tablet Take 1 tablet by mouth 4 (four) times daily as needed for diarrhea or loose stools.   Yes [provider]  gabapentin (NEURONTIN) 300 MG capsule Take 600-900 mg by mouth 2 (two) times daily. Take 2 capsules in the morning and 3 capsules at bedtime    Yes [provider]  HYDROcodone-acetaminophen (NORCO/VICODIN) 5-325 MG tablet Take 1 tablet by mouth at bedtime as needed. 02/15/17  Yes Deveshwar, Abel Presto, MD  hydroxychloroquine (PLAQUENIL) 200 MG tablet TAKE 1 TABLET BY MOUTH EVERY MORNING AND 1/2 TABLET  AT BEDTIME. 12/07/16  Yes Deveshwar, Abel Presto, MD  hyoscyamine (LEVBID) 0.375 MG 12 hr tablet Take 0.375 mg by mouth 2 (two) times daily as needed (for abdominal cramping/pain.).    Yes [provider]  KLOR-CON 10 10 MEQ tablet Take 4 tablets (40 mEq total) by mouth 2 (two) times daily. 04/23/17 04/18/18 Yes Turner, Eber Hong, MD  leflunomide (ARAVA) 20 MG tablet TAKE 1 TABLET (20 MG TOTAL) BY MOUTH DAILY. 02/06/17  Yes Deveshwar, Abel Presto, MD  lidocaine (LIDODERM) 5 % Place 1 patch onto the skin daily. Remove & Discard patch within 12 hours or as directed by MD 04/30/17  Yes Deveshwar, Abel Presto, MD  metoprolol succinate (TOPROL-XL) 25 MG 24 hr tablet TAKE 1 TABLET BY MOUTH DAILY. Patient taking differently: TAKE 1 TABLET BY MOUTH DAILY AT NIGHT 10/11/16  Yes Turner, Eber Hong, MD  Multiple Vitamin (MULTIVITAMIN WITH MINERALS) TABS tablet Take 1 tablet by mouth daily.   Yes [provider]  pantoprazole (PROTONIX) 40 MG tablet Take 40 mg by mouth daily before breakfast.    Yes [provider]  Probiotic Product ([place]) 170 MG CAPS Take 1 capsule by mouth every morning.   Yes [provider]  promethazine (PHENERGAN) 25 MG tablet TAKE 1 TABLET BY MOUTH EVERY 6 HOURS AS NEEDED. Patient taking differently: TAKE 1 TABLET BY MOUTH EVERY 6 HOURS AS NEEDED FOR NAUSEA/VOMITING 01/09/17  Yes Deveshwar, Abel Presto, MD  silver sulfADIAZINE (SILVADENE) 1 % cream Apply 1 application topically daily as needed (for skin infection/irritation.).    Yes [provider]  spironolactone (ALDACTONE) 25 MG tablet Take 1 tablet (25 mg total) by mouth daily. 05/16/17 08/14/17 Yes Turner, Eber Hong, MD  sulfaSALAzine (AZULFIDINE) 500 MG EC tablet TAKE 2 TABLETS BY MOUTH 2 TIMES A DAY. 03/09/17  Yes Deveshwar, Abel Presto, MD  tiZANidine (ZANAFLEX) 4 MG tablet TAKE 1 TABLET BY MOUTH DAILY AT BEDTIME 11/08/16  Yes Deveshwar, Abel Presto, MD  torsemide (DEMADEX) 20 MG tablet Take 1 tablet (20 mg total)  by mouth 2 (two) times daily. 05/16/17 08/14/17 Yes Turner, Eber Hong, MD  triamcinolone ointment (KENALOG) 0.1 % To affected areas of itching Three times daily Patient taking differently: Apply 1 application topically 3 (three) times daily as needed (FOR ITCHY SKIN.).  03/22/16  Yes Opalski, Deborah, DO    ROS:  Out of a complete 14 system review of symptoms, the patient complains only of the following symptoms, and all other reviewed systems are negative.  Unexpected weight change, excessive sweating Leg swelling, palpitations of the heart Cold intolerance Swollen abdomen Insomnia, sleep apnea, daytime sleepiness Environmental allergies Joint pain, swelling, back pain, muscle cramps, neck stiffness Itching numbness, weakness  Blood pressure 118/74, pulse 80, height 5\' 2"  (1.575 m), weight 229 lb (103.9 kg).  Physical Exam  General: The patient is alert and cooperative at the time of the examination.The patient is markedly obese.  Skin: 2+ edema below the knees is noted bilaterally.   Neurologic Exam  Mental status: The patient is alert and oriented x 3 at the time of the examination. The patient has apparent normal recent and remote memory, with an apparently normal attention span and concentration ability.   Cranial nerves: Facial symmetry is present. Speech is normal, no aphasia or dysarthria is noted. Extraocular movements are full. Visual fields are full.  Motor: The patient has good strength in all 4 extremities.  Sensory examination: Soft touch sensation is symmetric on the face, arms, and legs.  Coordination: The patient has good finger-nose-finger and heel-to-shin bilaterally.  Gait and station: The patient has a wide-based gait, the patient is able to ambulate independently. Tandem gait is unsteady.  Romberg is negative. No drift is seen.  Reflexes: Deep tendon reflexes are symmetric, but they are depressed.   Assessment/Plan:  1. Peripheral neuropathy   2.  Fibromyalgia   3. Gait instability   The patient will continue her current doses of gabapentin and carbamazepine, she is doing well with this. She has had blood work done recently that included a CBC and a comprehensive metabolic profile. The patient will follow-up through this office in 6 months.   Jill Alexanders MD 05/16/2017 2:48 PM  [facility] [address] [place], [place] [address]  Phone [phone] Fax [phone]

## 2017-05-16 NOTE — Patient Instructions (Addendum)
Medication Instructions:  Stop lasix (furosemide).  Stop metolazone.  Start torsemide 20 mg two times a day.  Start aldactone 25 mg daily.  Labwork: Your physician recommends that you have  lab work today--BMET.  Your physician recommends that you return for lab work in: 1 week--BMET.    Testing/Procedures: None  Follow-Up: Your physician recommends that you schedule a follow-up appointment in: 1 week with [facility] or [facility].  Your physician recommends that you schedule a follow-up appointment in: 3 months with Dr Radford Pax.      Any Other Special Instructions Will Be Listed Below (If Applicable). You have been referred to Nephrology.   If you need a refill on your cardiac medications before your next appointment, please call your pharmacy.

## 2017-05-17 ENCOUNTER — Telehealth: Payer: Self-pay | Admitting: *Deleted

## 2017-05-17 LAB — BASIC METABOLIC PANEL
BUN / CREAT RATIO: 15 (ref 12–28)
BUN: 11 mg/dL (ref 8–27)
CALCIUM: 9.2 mg/dL (ref 8.7–10.3)
CO2: 24 mmol/L (ref 20–29)
CREATININE: 0.75 mg/dL (ref 0.57–1.00)
Chloride: 103 mmol/L (ref 96–106)
GFR calc non Af Amer: 85 mL/min/{1.73_m2} (ref >59)
GFR, EST AFRICAN AMERICAN: 97 mL/min/{1.73_m2} (ref >59)
Glucose: 99 mg/dL (ref 65–99)
Potassium: 4.1 mmol/L (ref 3.5–5.2)
Sodium: 142 mmol/L (ref 134–144)

## 2017-05-17 MED ORDER — KLOR-CON 10 10 MEQ PO TBCR: 40.0000 meq | EXTENDED_RELEASE_TABLET | Freq: Every day | ORAL | 3 refills | Status: DC

## 2017-05-17 NOTE — Telephone Encounter (Signed)
-----   Message from Sueanne Margarita, MD sent at 05/17/2017  9:03 AM EDT ----- Please let patient know that labs were normal.  Please drop Kdur to 35meq daily since we added aldactone and make sure BMET is ordered for 1 week

## 2017-05-17 NOTE — Telephone Encounter (Signed)
Patient informed and verbalized understanding of plan. BMET already scheduled for 05/22/17. Rx sent to pharmacy with message to profile/patient will call when ready for refill.

## 2017-05-18 ENCOUNTER — Other Ambulatory Visit: Payer: Self-pay | Admitting: Rheumatology

## 2017-05-18 DIAGNOSIS — R269 Unspecified abnormalities of gait and mobility: Secondary | ICD-10-CM | POA: Diagnosis not present

## 2017-05-18 DIAGNOSIS — G4733 Obstructive sleep apnea (adult) (pediatric): Secondary | ICD-10-CM | POA: Diagnosis not present

## 2017-05-21 ENCOUNTER — Telehealth: Payer: Self-pay | Admitting: Cardiology

## 2017-05-21 NOTE — Telephone Encounter (Signed)
Spoke with patient who states she has stopped the spironolactone after dose on Saturday 10/6 due to side effects. She states she took 3 doses of spironolactone 25 mg, all of which made her have redness, tingling, and heat in her arms and face, followed by intense itching in her extremities. She states the symptoms resolved on their own without having to take benadryl. She reports her weight is down only 2 lb since last Wednesday. She states the swelling in her lower extremities is not improved. I advised that I will review with Dr. Radford Pax and call her back with her advice. She has a lab appointment for tomorrow 10/9 which I advised may need to be postponed. She thanked me for the call.

## 2017-05-21 NOTE — Telephone Encounter (Signed)
Advised patient that after talking to her earlier I realized Dr. Radford Pax is out of the office until tomorrow. I advised that we will cancel lab appointment for tomorrow and that I will call her back tomorrow with Dr. Theodosia Blender advice about her medications. She verbalized understanding and agreement and thanked me for the call.

## 2017-05-21 NOTE — Telephone Encounter (Signed)
Last Visit: 04/03/17 Next Visit: 09/06/17 Labs: 04/03/17 Low potassium and PCP aware  Okay to refill per Dr. Estanislado Pandy

## 2017-05-21 NOTE — Telephone Encounter (Signed)
New message    Pt c/o medication issue:  1. Name of Medication: spironolactone  2. How are you currently taking this medication (dosage and times per day)? Once daily  3. Are you having a reaction (difficulty breathing--STAT)? Red in face, tingly, burning, itching  4. What is your medication issue? Pt states she stopped taking the medication.  Pt c/o swelling: STAT is pt has developed SOB within 24 hours  1) How much weight have you gained and in what time span? 2 lbs since Wednesday   2) If swelling, where is the swelling located? Legs and feet  3) Are you currently taking a fluid pill? yes  4) Are you currently SOB? No   5) Do you have a log of your daily weights (if so, list)? no  6) Have you gained 3 pounds in a day or 5 pounds in a week? Lost two pounds since Wednesday  7) Have you traveled recently? no

## 2017-05-22 ENCOUNTER — Other Ambulatory Visit: Payer: PPO

## 2017-05-22 MED ORDER — TORSEMIDE 20 MG PO TABS: 40.0000 mg | ORAL_TABLET | Freq: Two times a day (BID) | ORAL | 11 refills | Status: DC

## 2017-05-22 MED ORDER — POTASSIUM CHLORIDE ER 10 MEQ PO TBCR: 40.0000 meq | EXTENDED_RELEASE_TABLET | Freq: Two times a day (BID) | ORAL | 11 refills | Status: DC

## 2017-05-22 NOTE — Telephone Encounter (Signed)
Received call back from patient and reviewed medication changes per Dr. Radford Pax. She verbalized understanding and agreement to d/c spironolactone and increase torsemide (demadex) to 40 mg BID and Kdur to 40 meq BID. She is scheduled to see Ferol Luz, PA next Thursday 10/18 and will get a bmet at that time. I advised her to call back with questions or concerns or if symptoms do not improve prior to appointment. She verbalized understanding and agreement and thanked me for the call.

## 2017-05-22 NOTE — Telephone Encounter (Signed)
Increase demadex to 40mg  BID and check BMET in 1 week.  Stop spironolactone.  She will need to increase her Kdur back to 21meq BID.  Please get her in to see PA in 1 week.

## 2017-05-23 ENCOUNTER — Telehealth: Payer: Self-pay | Admitting: Cardiology

## 2017-05-23 NOTE — Telephone Encounter (Signed)
Patient calling because she received a phone call earlier stating that her appointment with PA on 10/18 needed to be rescheduled because the provider is not going to be in the office. She states that she was rescheduled for 11/2.   Patient was instructed 2 days ago to increase demadex to 40mg  BID and Kdur 40 meq BID, stop spironolactone and check BMET and see PA in 1 week and patient was scheduled for appointment with PA and lab appointment on 10/18.   Patient states that she is not SOB and that her weight is down today to 225 lbs from 232 lbs on 10/7 and that the swelling in her legs and feet are better. Since patient is feeling better and symptoms have improved, I advised for patient to keep her lab appointment on 10/18 and we can re-evaluate her K+ and Cr and go from there. Advised patient to let us know if her symptoms worsen. Patient verbalized understanding and thanked me for the call.

## 2017-05-23 NOTE — Telephone Encounter (Signed)
New message     Patient wanting to be seen sooner that 11/2. Patient concerned this is too long to wait for appt.       Pt c/o swelling: STAT is pt has developed SOB within 24 hours  1) How much weight have you gained and in what time span? 11 lbs in 1 week  2) If swelling, where is the swelling located? Legs and ankles  3) Are you currently taking a fluid pill? yes  4) Are you currently SOB? no  5) Do you have a log of your daily weights (if so, list)? 219 on 9/25, today 225, was 232 on 10/7  6) Have you gained 3 pounds in a day or 5 pounds in a week? yes  7) Have you traveled recently? no

## 2017-05-28 DIAGNOSIS — R269 Unspecified abnormalities of gait and mobility: Secondary | ICD-10-CM | POA: Diagnosis not present

## 2017-05-28 DIAGNOSIS — G4733 Obstructive sleep apnea (adult) (pediatric): Secondary | ICD-10-CM | POA: Diagnosis not present

## 2017-05-31 ENCOUNTER — Ambulatory Visit: Payer: PPO | Admitting: Physician Assistant

## 2017-05-31 ENCOUNTER — Other Ambulatory Visit: Payer: PPO

## 2017-06-01 ENCOUNTER — Other Ambulatory Visit (INDEPENDENT_AMBULATORY_CARE_PROVIDER_SITE_OTHER): Payer: PPO | Admitting: *Deleted

## 2017-06-01 DIAGNOSIS — Z23 Encounter for immunization: Secondary | ICD-10-CM | POA: Diagnosis not present

## 2017-06-01 DIAGNOSIS — G4733 Obstructive sleep apnea (adult) (pediatric): Secondary | ICD-10-CM

## 2017-06-01 DIAGNOSIS — I5032 Chronic diastolic (congestive) heart failure: Secondary | ICD-10-CM

## 2017-06-01 DIAGNOSIS — I493 Ventricular premature depolarization: Secondary | ICD-10-CM | POA: Diagnosis not present

## 2017-06-02 LAB — BASIC METABOLIC PANEL
BUN / CREAT RATIO: 11 — AB (ref 12–28)
BUN: 8 mg/dL (ref 8–27)
CO2: 25 mmol/L (ref 20–29)
CREATININE: 0.7 mg/dL (ref 0.57–1.00)
Calcium: 9.2 mg/dL (ref 8.7–10.3)
Chloride: 103 mmol/L (ref 96–106)
GFR calc Af Amer: 106 mL/min/{1.73_m2} (ref >59)
GFR, EST NON AFRICAN AMERICAN: 92 mL/min/{1.73_m2} (ref >59)
Glucose: 156 mg/dL — ABNORMAL HIGH (ref 65–99)
Potassium: 3.5 mmol/L (ref 3.5–5.2)
SODIUM: 144 mmol/L (ref 134–144)

## 2017-06-04 ENCOUNTER — Telehealth: Payer: Self-pay

## 2017-06-04 DIAGNOSIS — I5032 Chronic diastolic (congestive) heart failure: Secondary | ICD-10-CM

## 2017-06-04 MED ORDER — POTASSIUM CHLORIDE ER 20 MEQ PO TBCR: 40.0000 meq | EXTENDED_RELEASE_TABLET | Freq: Three times a day (TID) | ORAL | 11 refills | Status: DC

## 2017-06-04 NOTE — Telephone Encounter (Signed)
Per Dr. Theodosia Blender note, called patient to verify how much potassium she is taking. She stated she is taking the 40 meq's twice daily as prescribed. She also stated she is taking Torsemide 2 tablets twice daily. She would also like to know about her Creatinine and BUN. Let patient know I will forward to Dr. Radford Pax and wait for her response and get back to her.

## 2017-06-04 NOTE — Telephone Encounter (Signed)
-----   Message from Sueanne Margarita, MD sent at 06/04/2017 11:31 AM EDT ----- Creatinine and BUN are fine.  Increase kdur to 65meq TID and repeat BMET in 1 week

## 2017-06-04 NOTE — Telephone Encounter (Signed)
Instructed patient to INCREASE KDUR to 40 meq TID. Patient will have BMET at [facility] with L. Ingold next Friday. She agrees with treatment plan.

## 2017-06-14 NOTE — Progress Notes (Deleted)
Cardiology Office Note   Date:  06/14/2017   ID:  [name], DOB 23-Jan-1951, MRN [id number]  PCP:  Mellody Dance, DO  Cardiologist:  Dr. Radford Pax    No chief complaint on file.     History of Present Illness: [name] is a 66 y.o. female who presents for ***  hx of PVC's, chronic diastolic CHF, DCM with normalization of LVF with EF 60-65%, obesity and OSA on CPAP.  She has had some problems with her potassium being low despite significant potassium repletion.  Volume overloaded on last fvisit and PVCs are suppressed with BB OSA  Last seen by Dr. Radford Pax 05/16/17   Past Medical History:  Diagnosis Date  . Arthritis    Rheumatoid arthritis,   . Blood transfusion    1981  . Chronic diastolic CHF (congestive heart failure) (HCC)    diastolic   . Fibromyalgia   . Heart murmur    as a child  . Hiatal hernia    sjorgens syndrome  . OSA (obstructive sleep apnea)   . Peripheral autonomic neuropathy of unknown cause     Past Surgical History:  Procedure Laterality Date  . ABDOMINAL HYSTERECTOMY     BTL, BSO  . BREAST SURGERY     mass removal   . CHOLECYSTECTOMY    . DIAGNOSTIC LAPAROSCOPY     x3  . KNEE ARTHROSCOPY     x2  . MASS EXCISION Left 03/22/2017   Procedure: EXCISION OF LEFT BREAST MASS;  Surgeon: Donnie Mesa, MD;  Location: [facility];  Service: General;  Laterality: Left;  . patotid cystectomy    . TENDON REPAIR  1980   left ankle and tibia  . TONSILLECTOMY    . TOTAL KNEE ARTHROPLASTY  06/21/2011   Procedure: TOTAL KNEE ARTHROPLASTY;  Surgeon: Gearlean Alf;  Location: [facility];  Service: Orthopedics;  Laterality: Right;  . TUBAL LIGATION       Current Outpatient Prescriptions  Medication Sig Dispense Refill  . carbamazepine (TEGRETOL) 200 MG tablet 1/2 tablet in the morning and 1 tablet in the evening (Patient taking differently: Take 100-200 mg by mouth 2 (two) times daily. 1/2 tablet in the morning and 1 tablet in the evening) 135 tablet 3    . Cholecalciferol (VITAMIN D3) 5000 units TABS Take 5,000 Units by mouth daily.     . cycloSPORINE (RESTASIS) 0.05 % ophthalmic emulsion Place 2 drops into both eyes 2 (two) times daily.     . diphenoxylate-atropine (LOMOTIL) 2.5-0.025 MG per tablet Take 1 tablet by mouth 4 (four) times daily as needed for diarrhea or loose stools.    . gabapentin (NEURONTIN) 300 MG capsule Take 600-900 mg by mouth 2 (two) times daily. Take 2 capsules in the morning and 3 capsules at bedtime     . HYDROcodone-acetaminophen (NORCO/VICODIN) 5-325 MG tablet Take 1 tablet by mouth at bedtime as needed. 30 tablet 0  . hydroxychloroquine (PLAQUENIL) 200 MG tablet TAKE 1 TABLET BY MOUTH EVERY MORNING AND 1/2 TABLET AT BEDTIME. 135 tablet 1  . hyoscyamine (LEVBID) 0.375 MG 12 hr tablet Take 0.375 mg by mouth 2 (two) times daily as needed (for abdominal cramping/pain.).     [name] leflunomide (ARAVA) 20 MG tablet TAKE 1 TABLET BY MOUTH DAILY. 90 tablet 0  . lidocaine (LIDODERM) 5 % Place 1 patch onto the skin daily. Remove & Discard patch within 12 hours or as directed by MD 30 patch 0  . metoprolol succinate (TOPROL-XL)  25 MG 24 hr tablet TAKE 1 TABLET BY MOUTH DAILY. (Patient taking differently: TAKE 1 TABLET BY MOUTH DAILY AT NIGHT) 90 tablet 2  . Multiple Vitamin (MULTIVITAMIN WITH MINERALS) TABS tablet Take 1 tablet by mouth daily.    . pantoprazole (PROTONIX) 40 MG tablet Take 40 mg by mouth daily before breakfast.     . potassium chloride 20 MEQ TBCR Take 40 mEq by mouth 3 (three) times daily. 180 tablet 11  . Probiotic Product ([place]) 170 MG CAPS Take 1 capsule by mouth every morning.    . promethazine (PHENERGAN) 25 MG tablet TAKE 1 TABLET BY MOUTH EVERY 6 HOURS AS NEEDED. (Patient taking differently: TAKE 1 TABLET BY MOUTH EVERY 6 HOURS AS NEEDED FOR NAUSEA/VOMITING) 120 tablet 2  . silver sulfADIAZINE (SILVADENE) 1 % cream Apply 1 application topically daily as needed (for skin  infection/irritation.).     [name] sulfaSALAzine (AZULFIDINE) 500 MG EC tablet TAKE 2 TABLETS BY MOUTH 2 TIMES A DAY. 360 tablet 0  . tiZANidine (ZANAFLEX) 4 MG tablet TAKE 1 TABLET BY MOUTH DAILY AT BEDTIME 90 tablet 1  . torsemide (DEMADEX) 20 MG tablet Take 2 tablets (40 mg total) by mouth 2 (two) times daily. 120 tablet 11  . triamcinolone ointment (KENALOG) 0.1 % To affected areas of itching Three times daily (Patient taking differently: Apply 1 application topically 3 (three) times daily as needed (FOR ITCHY SKIN.). ) 80 g 1   No current facility-administered medications for this visit.     Allergies:   Cymbalta [duloxetine hcl]; Demerol; Ivp dye [iodinated diagnostic agents]; Morphine and related; Spironolactone; and Adhesive [tape]    Social History:  The patient  reports that she has never smoked. She has never used smokeless tobacco. She reports that she drinks alcohol. She reports that she does not use drugs.   Family History:  The patient's ***family history includes Alzheimer's disease in her mother; Cancer in her father; Glaucoma in her brother, brother, and mother; Heart attack in her brother and mother; Heart disease in her brother; Hyperlipidemia in her brother; Hypertension in her mother.    ROS:  General:no colds or fevers, no weight changes Skin:no rashes or ulcers HEENT:no blurred vision, no congestion CV:see HPI PUL:see HPI GI:no diarrhea constipation or melena, no indigestion GU:no hematuria, no dysuria MS:no joint pain, no claudication Neuro:no syncope, no lightheadedness Endo:no diabetes, no thyroid disease Wt Readings from Last 3 Encounters:  05/16/17 229 lb (103.9 kg)  04/03/17 223 lb (101.2 kg)  03/22/17 221 lb (100.2 kg)     PHYSICAL EXAM: VS:  There were no vitals taken for this visit. , BMI There is no height or weight on file to calculate BMI. General:Pleasant affect, NAD Skin:Warm and dry, brisk capillary refill HEENT:normocephalic, sclera clear, mucus  membranes moist Neck:supple, no JVD, no bruits  Heart:S1S2 RRR without murmur, gallup, rub or click Lungs:clear without rales, rhonchi, or wheezes QQP:YPPJ, non tender, + BS, do not palpate liver spleen or masses Ext:no lower ext edema, 2+ pedal pulses, 2+ radial pulses Neuro:alert and oriented, MAE, follows commands, + facial symmetry    EKG:  EKG is ordered today. The ekg ordered today demonstrates ***   Recent Labs: 03/20/2017: Hemoglobin 13.8; Platelets 177 04/03/2017: ALT 14 06/01/2017: BUN 8; Creatinine, Ser 0.70; Potassium 3.5; Sodium 144    Lipid Panel    Component Value Date/Time   CHOL 201 (H) 08/11/2016 1017   TRIG 293 (H) 08/11/2016 1017   HDL 43 (L)  08/11/2016 1017   CHOLHDL 4.7 08/11/2016 1017   VLDL 59 (H) 08/11/2016 1017   LDLCALC 99 08/11/2016 1017       Other studies Reviewed: Additional studies/ records that were reviewed today include: ***.   ASSESSMENT AND PLAN:  1.  ***   Current medicines are reviewed with the patient today.  The patient Has no concerns regarding medicines.  The following changes have been made:  See above Labs/ tests ordered today include:see above  Disposition:   FU:  see above  Signed, Cecilie Kicks, NP  06/14/2017 10:55 PM    [address], [place], [place], [place] Phone: [phone]; Fax: [phone]

## 2017-06-15 ENCOUNTER — Other Ambulatory Visit: Payer: PPO

## 2017-06-15 ENCOUNTER — Ambulatory Visit: Payer: PPO | Admitting: Cardiology

## 2017-06-18 ENCOUNTER — Other Ambulatory Visit: Payer: Self-pay | Admitting: Rheumatology

## 2017-06-18 NOTE — Telephone Encounter (Addendum)
Last Visit: 04/03/17 Next Visit:09/06/17 Labs: 03/20/17 Decreased potassium (being followed by cardiology)  PLQ Eye Exam: 07/2016 WNL  Okay to refill per Dr. Estanislado Pandy

## 2017-06-19 ENCOUNTER — Other Ambulatory Visit: Payer: PPO

## 2017-06-20 ENCOUNTER — Telehealth: Payer: Self-pay

## 2017-06-20 ENCOUNTER — Telehealth: Payer: Self-pay | Admitting: Rheumatology

## 2017-06-20 ENCOUNTER — Other Ambulatory Visit: Payer: PPO

## 2017-06-20 ENCOUNTER — Other Ambulatory Visit: Payer: Self-pay

## 2017-06-20 ENCOUNTER — Other Ambulatory Visit: Payer: Self-pay | Admitting: Rheumatology

## 2017-06-20 DIAGNOSIS — I5032 Chronic diastolic (congestive) heart failure: Secondary | ICD-10-CM

## 2017-06-20 NOTE — Telephone Encounter (Signed)
FYI- Patient wanted to let our office know that she had her lab work drawn at Dr. Theodosia Blender office.

## 2017-06-20 NOTE — Telephone Encounter (Signed)
Patient needs refill on Plaquenil to [facility].

## 2017-06-20 NOTE — Telephone Encounter (Signed)
Spoke with patient and advised need updated labs. Patient will update. Prescription sent to pharmacy.

## 2017-06-21 DIAGNOSIS — I5032 Chronic diastolic (congestive) heart failure: Secondary | ICD-10-CM | POA: Diagnosis not present

## 2017-06-21 DIAGNOSIS — E876 Hypokalemia: Secondary | ICD-10-CM | POA: Diagnosis not present

## 2017-06-21 LAB — COMPREHENSIVE METABOLIC PANEL WITH GFR
ALT: 16 IU/L (ref 0–32)
AST: 19 IU/L (ref 0–40)
Albumin/Globulin Ratio: 2 (ref 1.2–2.2)
Albumin: 4.7 g/dL (ref 3.6–4.8)
Alkaline Phosphatase: 147 IU/L — ABNORMAL HIGH (ref 39–117)
BUN/Creatinine Ratio: 27 (ref 12–28)
BUN: 23 mg/dL (ref 8–27)
Bilirubin Total: 0.2 mg/dL (ref 0.0–1.2)
CO2: 30 mmol/L — ABNORMAL HIGH (ref 20–29)
Calcium: 10.3 mg/dL (ref 8.7–10.3)
Chloride: 97 mmol/L (ref 96–106)
Creatinine, Ser: 0.84 mg/dL (ref 0.57–1.00)
GFR calc Af Amer: 84 mL/min/1.73
GFR calc non Af Amer: 73 mL/min/1.73
Globulin, Total: 2.4 g/dL (ref 1.5–4.5)
Glucose: 94 mg/dL (ref 65–99)
Potassium: 3.8 mmol/L (ref 3.5–5.2)
Sodium: 144 mmol/L (ref 134–144)
Total Protein: 7.1 g/dL (ref 6.0–8.5)

## 2017-06-21 LAB — CBC
Hematocrit: 42.8 % (ref 34.0–46.6)
Hemoglobin: 14.4 g/dL (ref 11.1–15.9)
MCH: 29.8 pg (ref 26.6–33.0)
MCHC: 33.6 g/dL (ref 31.5–35.7)
MCV: 88 fL (ref 79–97)
PLATELETS: 290 10*3/uL (ref 150–379)
RBC: 4.84 x10E6/uL (ref 3.77–5.28)
RDW: 13.5 % (ref 12.3–15.4)
WBC: 8.2 10*3/uL (ref 3.4–10.8)

## 2017-06-22 ENCOUNTER — Encounter: Payer: Self-pay | Admitting: Cardiology

## 2017-06-25 ENCOUNTER — Telehealth: Payer: Self-pay

## 2017-06-25 NOTE — Telephone Encounter (Signed)
Informed patient that per Dr. Radford Pax she should follow up with Dr. Moshe Cipro for further recommendations on switching the torsemide back to lasix. Patient verbalized understanding and thanked me for the call.

## 2017-06-25 NOTE — Telephone Encounter (Signed)
Called to inform patient of lab results. Patient stated that her weight is continuing to fluctuate and the torsemide is not working as well as the lasix with the metolazone. Patient is requesting if she could go back on the lasix and metolazone regimen because it worked better for her. Patient also stated that Dr. Bonnita Hollow started her back on spironolactone 25 mg daily on 06/21/17 and is tolerating it, patient instructed to increase to BID on 06/28/17 since she is able to tolerate once a day. Labs sent over to pcp. Informed her that I would send to Dr. Radford Pax for further recommendations. Patient verbalized understanding and thanked me for the call.

## 2017-06-25 NOTE — Telephone Encounter (Signed)
I would like her to check with Dr. Moshe Cipro on her recommendations for Lasix vx. Torsemide - I'm fine with either

## 2017-06-28 ENCOUNTER — Telehealth: Payer: Self-pay

## 2017-06-28 NOTE — Telephone Encounter (Signed)
Patient calling to get clarification on PLQ RX directions.  Stated that the directions were different than before.  Please advise.  Cb# is [phone].  Thank You.

## 2017-06-29 NOTE — Telephone Encounter (Signed)
Patient advised she is to take PLQ 1 tablet twice a day Monday- Friday only. Patient verbalized understanding.

## 2017-07-02 ENCOUNTER — Encounter: Payer: Self-pay | Admitting: Family Medicine

## 2017-07-02 ENCOUNTER — Ambulatory Visit (INDEPENDENT_AMBULATORY_CARE_PROVIDER_SITE_OTHER): Payer: PPO | Admitting: Family Medicine

## 2017-07-02 VITALS — BP 96/61 | HR 66 | Ht 62.0 in | Wt 228.0 lb

## 2017-07-02 DIAGNOSIS — R7302 Impaired glucose tolerance (oral): Secondary | ICD-10-CM

## 2017-07-02 DIAGNOSIS — R748 Abnormal levels of other serum enzymes: Secondary | ICD-10-CM | POA: Diagnosis not present

## 2017-07-02 DIAGNOSIS — E782 Mixed hyperlipidemia: Secondary | ICD-10-CM | POA: Diagnosis not present

## 2017-07-02 DIAGNOSIS — M359 Systemic involvement of connective tissue, unspecified: Secondary | ICD-10-CM

## 2017-07-02 DIAGNOSIS — M810 Age-related osteoporosis without current pathological fracture: Secondary | ICD-10-CM

## 2017-07-02 DIAGNOSIS — E876 Hypokalemia: Secondary | ICD-10-CM | POA: Diagnosis not present

## 2017-07-02 DIAGNOSIS — D8989 Other specified disorders involving the immune mechanism, not elsewhere classified: Secondary | ICD-10-CM

## 2017-07-02 DIAGNOSIS — Z0000 Encounter for general adult medical examination without abnormal findings: Secondary | ICD-10-CM

## 2017-07-02 DIAGNOSIS — M0579 Rheumatoid arthritis with rheumatoid factor of multiple sites without organ or systems involvement: Secondary | ICD-10-CM

## 2017-07-02 DIAGNOSIS — Z23 Encounter for immunization: Secondary | ICD-10-CM

## 2017-07-02 MED ORDER — PNEUMOCOCCAL 13-VAL CONJ VACC IM SUSP
0.5000 mL | Freq: Once | INTRAMUSCULAR | Status: AC
  Administered 2017-07-02: 0.5 mL via INTRAMUSCULAR

## 2017-07-02 MED ORDER — PNEUMOCOCCAL VAC POLYVALENT 25 MCG/0.5ML IJ INJ: 0.5000 mL | INJECTION | INTRAMUSCULAR | Status: DC

## 2017-07-02 NOTE — Patient Instructions (Addendum)
[name], please have [name] update this pt's picture in the chart prior to leaving the office today. Thank you.   - Please update allergies-DC spironolactone from the list at sulfa, add niacin as intolerant.  -Please order full set of fasting labs on patient- to be drawn today.   She will follow-up in 2 weeks for those results.  Please send patient home with home stool cards.    Preventive Care for Adults, Female  A healthy lifestyle and preventive care can promote health and wellness. Preventive health guidelines for women include the following key practices.   A routine yearly physical is a good way to check with your health care provider about your health and preventive screening. It is a chance to share any concerns and updates on your health and to receive a thorough exam.   Visit your dentist for a routine exam and preventive care every 6 months. Brush your teeth twice a day and floss once a day. Good oral hygiene prevents tooth decay and gum disease.   The frequency of eye exams is based on your age, health, family medical history, use of contact lenses, and other factors. Follow your health care provider's recommendations for frequency of eye exams.   Eat a healthy diet. Foods like vegetables, fruits, whole grains, low-fat dairy products, and lean protein foods contain the nutrients you need without too many calories. Decrease your intake of foods high in solid fats, added sugars, and salt. Eat the right amount of calories for you.Get information about a proper diet from your health care provider, if necessary.   Regular physical exercise is one of the most important things you can do for your health. Most adults should get at least 150 minutes of moderate-intensity exercise (any activity that increases your heart rate and causes you to sweat) each week. In addition, most adults need muscle-strengthening exercises on 2 or more days a week.   Maintain a healthy weight. The body mass  index (BMI) is a screening tool to identify possible weight problems. It provides an estimate of body fat based on height and weight. Your health care provider can find your BMI, and can help you achieve or maintain a healthy weight.For adults 20 years and older:   - A BMI below 18.5 is considered underweight.   - A BMI of 18.5 to 24.9 is normal.   - A BMI of 25 to 29.9 is considered overweight.   - A BMI of 30 and above is considered obese.   Maintain normal blood lipids and cholesterol levels by exercising and minimizing your intake of trans and saturated fats.  Eat a balanced diet with plenty of fruit and vegetables. Blood tests for lipids and cholesterol should begin at age 5 and be repeated every 5 years minimum.  If your lipid or cholesterol levels are high, you are over 40, or you are at high risk for heart disease, you may need your cholesterol levels checked more frequently.Ongoing high lipid and cholesterol levels should be treated with medicines if diet and exercise are not working.   If you smoke, find out from your health care provider how to quit. If you do not use tobacco, do not start.   Lung cancer screening is recommended for adults aged 57-80 years who are at high risk for developing lung cancer because of a history of smoking. A yearly low-dose CT scan of the lungs is recommended for people who have at least a 30-pack-year history of smoking and are  a current smoker or have quit within the past 15 years. A pack year of smoking is smoking an average of 1 pack of cigarettes a day for 1 year (for example: 1 pack a day for 30 years or 2 packs a day for 15 years). Yearly screening should continue until the smoker has stopped smoking for at least 15 years. Yearly screening should be stopped for people who develop a health problem that would prevent them from having lung cancer treatment.   If you are pregnant, do not drink alcohol. If you are breastfeeding, be very cautious about  drinking alcohol. If you are not pregnant and choose to drink alcohol, do not have more than 1 drink per day. One drink is considered to be 12 ounces (355 mL) of beer, 5 ounces (148 mL) of wine, or 1.5 ounces (44 mL) of liquor.   Avoid use of street drugs. Do not share needles with anyone. Ask for help if you need support or instructions about stopping the use of drugs.   High blood pressure causes heart disease and increases the risk of stroke. Your blood pressure should be checked at least yearly.  Ongoing high blood pressure should be treated with medicines if weight loss and exercise do not work.   If you are 67-32 years old, ask your health care provider if you should take aspirin to prevent strokes.   Diabetes screening involves taking a blood sample to check your fasting blood sugar level. This should be done once every 3 years, after age 47, if you are within normal weight and without risk factors for diabetes. Testing should be considered at a younger age or be carried out more frequently if you are overweight and have at least 1 risk factor for diabetes.   Breast cancer screening is essential preventive care for women. You should practice "breast self-awareness."  This means understanding the normal appearance and feel of your breasts and may include breast self-examination.  Any changes detected, no matter how small, should be reported to a health care provider.  Women in their 72s and 30s should have a clinical breast exam (CBE) by a health care provider as part of a regular health exam every 1 to 3 years.  After age 38, women should have a CBE every year.  Starting at age 49, women should consider having a mammogram (breast X-ray test) every year.  Women who have a family history of breast cancer should talk to their health care provider about genetic screening.  Women at a high risk of breast cancer should talk to their health care providers about having an MRI and a mammogram every  year.   -Breast cancer gene (BRCA)-related cancer risk assessment is recommended for women who have family members with BRCA-related cancers. BRCA-related cancers include breast, ovarian, tubal, and peritoneal cancers. Having family members with these cancers may be associated with an increased risk for harmful changes (mutations) in the breast cancer genes BRCA1 and BRCA2. Results of the assessment will determine the need for genetic counseling and BRCA1 and BRCA2 testing.   The Pap test is a screening test for cervical cancer. A Pap test can show cell changes on the cervix that might become cervical cancer if left untreated. A Pap test is a procedure in which cells are obtained and examined from the lower end of the uterus (cervix).   - Women should have a Pap test starting at age 69.   - Between ages 26 and 55, Pap  tests should be repeated every 2 years.   - Beginning at age 59, you should have a Pap test every 3 years as long as the past 3 Pap tests have been normal.   - Some women have medical problems that increase the chance of getting cervical cancer. Talk to your health care provider about these problems. It is especially important to talk to your health care provider if a new problem develops soon after your last Pap test. In these cases, your health care provider may recommend more frequent screening and Pap tests.   - The above recommendations are the same for women who have or have not gotten the vaccine for human papillomavirus (HPV).   - If you had a hysterectomy for a problem that was not cancer or a condition that could lead to cancer, then you no longer need Pap tests. Even if you no longer need a Pap test, a regular exam is a good idea to make sure no other problems are starting.   - If you are between ages 6 and 2 years, and you have had normal Pap tests going back 10 years, you no longer need Pap tests. Even if you no longer need a Pap test, a regular exam is a good idea to  make sure no other problems are starting.   - If you have had past treatment for cervical cancer or a condition that could lead to cancer, you need Pap tests and screening for cancer for at least 20 years after your treatment.   - If Pap tests have been discontinued, risk factors (such as a new sexual partner) need to be reassessed to determine if screening should be resumed.   - The HPV test is an additional test that may be used for cervical cancer screening. The HPV test looks for the virus that can cause the cell changes on the cervix. The cells collected during the Pap test can be tested for HPV. The HPV test could be used to screen women aged 51 years and older, and should be used in women of any age who have unclear Pap test results. After the age of 34, women should have HPV testing at the same frequency as a Pap test.   Colorectal cancer can be detected and often prevented. Most routine colorectal cancer screening begins at the age of 42 years and continues through age 28 years. However, your health care provider may recommend screening at an earlier age if you have risk factors for colon cancer. On a yearly basis, your health care provider may provide home test kits to check for hidden blood in the stool.  Use of a small camera at the end of a tube, to directly examine the colon (sigmoidoscopy or colonoscopy), can detect the earliest forms of colorectal cancer. Talk to your health care provider about this at age 15, when routine screening begins. Direct exam of the colon should be repeated every 5 -10 years through age 41 years, unless early forms of pre-cancerous polyps or small growths are found.   People who are at an increased risk for hepatitis B should be screened for this virus. You are considered at high risk for hepatitis B if:  -You were born in a country where hepatitis B occurs often. Talk with your health care provider about which countries are considered high risk.  - Your  parents were born in a high-risk country and you have not received a shot to protect against hepatitis B (hepatitis  B vaccine).  - You have HIV or AIDS.  - You use needles to inject street drugs.  - You live with, or have sex with, someone who has Hepatitis B.  - You get hemodialysis treatment.  - You take certain medicines for conditions like cancer, organ transplantation, and autoimmune conditions.   Hepatitis C blood testing is recommended for all people born from [birth year] through 1965 and any individual with known risks for hepatitis C.   Practice safe sex. Use condoms and avoid high-risk sexual practices to reduce the spread of sexually transmitted infections (STIs). STIs include gonorrhea, chlamydia, syphilis, trichomonas, herpes, HPV, and human immunodeficiency virus (HIV). Herpes, HIV, and HPV are viral illnesses that have no cure. They can result in disability, cancer, and death. Sexually active women aged 81 years and younger should be checked for chlamydia. Older women with new or multiple partners should also be tested for chlamydia. Testing for other STIs is recommended if you are sexually active and at increased risk.   Osteoporosis is a disease in which the bones lose minerals and strength with aging. This can result in serious bone fractures or breaks. The risk of osteoporosis can be identified using a bone density scan. Women ages 28 years and over and women at risk for fractures or osteoporosis should discuss screening with their health care providers. Ask your health care provider whether you should take a calcium supplement or vitamin D to There are also several preventive steps women can take to avoid osteoporosis and resulting fractures or to keep osteoporosis from worsening. -->Recommendations include:  Eat a balanced diet high in fruits, vegetables, calcium, and vitamins.  Get enough calcium. The recommended total intake of is 1,200 mg daily; for best absorption, if taking  supplements, divide doses into 250-500 mg doses throughout the day. Of the two types of calcium, calcium carbonate is best absorbed when taken with food but calcium citrate can be taken on an empty stomach.  Get enough vitamin D. [employer] and the [employer] recommend at least 1,000 IU per day for women age 90 and over who are at risk of vitamin D deficiency. Vitamin D deficiency can be caused by inadequate sun exposure (for example, those who live in [place]).  Avoid alcohol and smoking. Heavy alcohol intake (more than 7 drinks per week) increases the risk of falls and hip fracture and women smokers tend to lose bone more rapidly and have lower bone mass than nonsmokers. Stopping smoking is one of the most important changes women can make to improve their health and decrease risk for disease.  Be physically active every day. Weight-bearing exercise (for example, fast walking, hiking, jogging, and weight training) may strengthen bones or slow the rate of bone loss that comes with aging. Balancing and muscle-strengthening exercises can reduce the risk of falling and fracture.  Consider therapeutic medications. Currently, several types of effective drugs are available. Healthcare providers can recommend the type most appropriate for each woman.  Eliminate environmental factors that may contribute to accidents. Falls cause nearly 90% of all osteoporotic fractures, so reducing this risk is an important bone-health strategy. Measures include ample lighting, removing obstructions to walking, using nonskid rugs on floors, and placing mats and/or grab bars in showers.  Be aware of medication side effects. Some common medicines make bones weaker. These include a type of steroid drug called glucocorticoids used for arthritis and asthma, some antiseizure drugs, certain sleeping pills, treatments for endometriosis, and some cancer drugs. An overactive  thyroid gland or using too much  thyroid hormone for an underactive thyroid can also be a problem. If you are taking these medicines, talk to your doctor about what you can do to help protect your bones.reduce the rate of osteoporosis.    Menopause can be associated with physical symptoms and risks. Hormone replacement therapy is available to decrease symptoms and risks. You should talk to your health care provider about whether hormone replacement therapy is right for you.   Use sunscreen. Apply sunscreen liberally and repeatedly throughout the day. You should seek shade when your shadow is shorter than you. Protect yourself by wearing long sleeves, pants, a wide-brimmed hat, and sunglasses year round, whenever you are outdoors.   Once a month, do a whole body skin exam, using a mirror to look at the skin on your back. Tell your health care provider of new moles, moles that have irregular borders, moles that are larger than a pencil eraser, or moles that have changed in shape or color.   -Stay current with required vaccines (immunizations).   Influenza vaccine. All adults should be immunized every year.  Tetanus, diphtheria, and acellular pertussis (Td, Tdap) vaccine. Pregnant women should receive 1 dose of Tdap vaccine during each pregnancy. The dose should be obtained regardless of the length of time since the last dose. Immunization is preferred during the 27th 36th week of gestation. An adult who has not previously received Tdap or who does not know her vaccine status should receive 1 dose of Tdap. This initial dose should be followed by tetanus and diphtheria toxoids (Td) booster doses every 10 years. Adults with an unknown or incomplete history of completing a 3-dose immunization series with Td-containing vaccines should begin or complete a primary immunization series including a Tdap dose. Adults should receive a Td booster every 10 years.  Varicella vaccine. An adult without evidence of immunity to varicella should  receive 2 doses or a second dose if she has previously received 1 dose. Pregnant females who do not have evidence of immunity should receive the first dose after pregnancy. This first dose should be obtained before leaving the health care facility. The second dose should be obtained 4 8 weeks after the first dose.  Human papillomavirus (HPV) vaccine. Females aged 35 26 years who have not received the vaccine previously should obtain the 3-dose series. The vaccine is not recommended for use in pregnant females. However, pregnancy testing is not needed before receiving a dose. If a female is found to be pregnant after receiving a dose, no treatment is needed. In that case, the remaining doses should be delayed until after the pregnancy. Immunization is recommended for any person with an immunocompromised condition through the age of 47 years if she did not get any or all doses earlier. During the 3-dose series, the second dose should be obtained 4 8 weeks after the first dose. The third dose should be obtained 24 weeks after the first dose and 16 weeks after the second dose.  Zoster vaccine. One dose is recommended for adults aged 25 years or older unless certain conditions are present.  Measles, mumps, and rubella (MMR) vaccine. Adults born before [birth year] generally are considered immune to measles and mumps. Adults born in [birth year] or later should have 1 or more doses of MMR vaccine unless there is a contraindication to the vaccine or there is laboratory evidence of immunity to each of the three diseases. A routine second dose of MMR vaccine should be obtained  at least 28 days after the first dose for students attending postsecondary schools, health care workers, or international travelers. People who received inactivated measles vaccine or an unknown type of measles vaccine during 1963 1967 should receive 2 doses of MMR vaccine. People who received inactivated mumps vaccine or an unknown type of mumps vaccine before  1979 and are at high risk for mumps infection should consider immunization with 2 doses of MMR vaccine. For females of childbearing age, rubella immunity should be determined. If there is no evidence of immunity, females who are not pregnant should be vaccinated. If there is no evidence of immunity, females who are pregnant should delay immunization until after pregnancy. Unvaccinated health care workers born before 75 who lack laboratory evidence of measles, mumps, or rubella immunity or laboratory confirmation of disease should consider measles and mumps immunization with 2 doses of MMR vaccine or rubella immunization with 1 dose of MMR vaccine.  Pneumococcal 13-valent conjugate (PCV13) vaccine. When indicated, a person who is uncertain of her immunization history and has no record of immunization should receive the PCV13 vaccine. An adult aged 40 years or older who has certain medical conditions and has not been previously immunized should receive 1 dose of PCV13 vaccine. This PCV13 should be followed with a dose of pneumococcal polysaccharide (PPSV23) vaccine. The PPSV23 vaccine dose should be obtained at least 8 weeks after the dose of PCV13 vaccine. An adult aged 23 years or older who has certain medical conditions and previously received 1 or more doses of PPSV23 vaccine should receive 1 dose of PCV13. The PCV13 vaccine dose should be obtained 1 or more years after the last PPSV23 vaccine dose.  Pneumococcal polysaccharide (PPSV23) vaccine. When PCV13 is also indicated, PCV13 should be obtained first. All adults aged 17 years and older should be immunized. An adult younger than age 24 years who has certain medical conditions should be immunized. Any person who resides in a nursing home or long-term care facility should be immunized. An adult smoker should be immunized. People with an immunocompromised condition and certain other conditions should receive both PCV13 and PPSV23 vaccines. People with human  immunodeficiency virus (HIV) infection should be immunized as soon as possible after diagnosis. Immunization during chemotherapy or radiation therapy should be avoided. Routine use of PPSV23 vaccine is not recommended for American Indians, [place] Natives, or people younger than 65 years unless there are medical conditions that require PPSV23 vaccine. When indicated, people who have unknown immunization and have no record of immunization should receive PPSV23 vaccine. One-time revaccination 5 years after the first dose of PPSV23 is recommended for people aged 68 64 years who have chronic kidney failure, nephrotic syndrome, asplenia, or immunocompromised conditions. People who received 1 2 doses of PPSV23 before age 9 years should receive another dose of PPSV23 vaccine at age 53 years or later if at least 5 years have passed since the previous dose. Doses of PPSV23 are not needed for people immunized with PPSV23 at or after age 1 years.  Meningococcal vaccine. Adults with asplenia or persistent complement component deficiencies should receive 2 doses of quadrivalent meningococcal conjugate (MenACWY-D) vaccine. The doses should be obtained at least 2 months apart. Microbiologists working with certain meningococcal bacteria, [employer] recruits, people at risk during an outbreak, and people who travel to or live in countries with a high rate of meningitis should be immunized. A first-year college student up through age 14 years who is living in a residence hall should receive a  dose if she did not receive a dose on or after her 16th birthday. Adults who have certain high-risk conditions should receive one or more doses of vaccine.  Hepatitis A vaccine. Adults who wish to be protected from this disease, have certain high-risk conditions, work with hepatitis A-infected animals, work in hepatitis A research labs, or travel to or work in countries with a high rate of hepatitis A should be immunized. Adults who were  previously unvaccinated and who anticipate close contact with an international adoptee during the first 60 days after arrival in the [place] from a country with a high rate of hepatitis A should be immunized.  Hepatitis B vaccine.  Adults who wish to be protected from this disease, have certain high-risk conditions, may be exposed to blood or other infectious body fluids, are household contacts or sex partners of hepatitis B positive people, are clients or workers in certain care facilities, or travel to or work in countries with a high rate of hepatitis B should be immunized.  Haemophilus influenzae type b (Hib) vaccine. A previously unvaccinated person with asplenia or sickle cell disease or having a scheduled splenectomy should receive 1 dose of Hib vaccine. Regardless of previous immunization, a recipient of a hematopoietic stem cell transplant should receive a 3-dose series 6 12 months after her successful transplant. Hib vaccine is not recommended for adults with HIV infection.  Preventive Services / Frequency Ages 54 to 39years  Blood pressure check.** / Every 1 to 2 years.  Lipid and cholesterol check.** / Every 5 years beginning at age 40.  Clinical breast exam.** / Every 3 years for women in their 88s and 73s.  BRCA-related cancer risk assessment.** / For women who have family members with a BRCA-related cancer (breast, ovarian, tubal, or peritoneal cancers).  Pap test.** / Every 2 years from ages 40 through 45. Every 3 years starting at age 2 through age 81 or 43 with a history of 3 consecutive normal Pap tests.  HPV screening.** / Every 3 years from ages 67 through ages 61 to 39 with a history of 3 consecutive normal Pap tests.  Hepatitis C blood test.** / For any individual with known risks for hepatitis C.  Skin self-exam. / Monthly.  Influenza vaccine. / Every year.  Tetanus, diphtheria, and acellular pertussis (Tdap, Td) vaccine.** / Consult your health care  provider. Pregnant women should receive 1 dose of Tdap vaccine during each pregnancy. 1 dose of Td every 10 years.  Varicella vaccine.** / Consult your health care provider. Pregnant females who do not have evidence of immunity should receive the first dose after pregnancy.  HPV vaccine. / 3 doses over 6 months, if 17 and younger. The vaccine is not recommended for use in pregnant females. However, pregnancy testing is not needed before receiving a dose.  Measles, mumps, rubella (MMR) vaccine.** / You need at least 1 dose of MMR if you were born in 1957 or later. You may also need a 2nd dose. For females of childbearing age, rubella immunity should be determined. If there is no evidence of immunity, females who are not pregnant should be vaccinated. If there is no evidence of immunity, females who are pregnant should delay immunization until after pregnancy.  Pneumococcal 13-valent conjugate (PCV13) vaccine.** / Consult your health care provider.  Pneumococcal polysaccharide (PPSV23) vaccine.** / 1 to 2 doses if you smoke cigarettes or if you have certain conditions.  Meningococcal vaccine.** / 1 dose if you are age 58 to  21 years and a Market researcher living in a residence hall, or have one of several medical conditions, you need to get vaccinated against meningococcal disease. You may also need additional booster doses.  Hepatitis A vaccine.** / Consult your health care provider.  Hepatitis B vaccine.** / Consult your health care provider.  Haemophilus influenzae type b (Hib) vaccine.** / Consult your health care provider.  Ages 48 to 64years  Blood pressure check.** / Every 1 to 2 years.  Lipid and cholesterol check.** / Every 5 years beginning at age 37 years.  Lung cancer screening. / Every year if you are aged 37 80 years and have a 30-pack-year history of smoking and currently smoke or have quit within the past 15 years. Yearly screening is stopped once you have quit  smoking for at least 15 years or develop a health problem that would prevent you from having lung cancer treatment.  Clinical breast exam.** / Every year after age 20 years.  BRCA-related cancer risk assessment.** / For women who have family members with a BRCA-related cancer (breast, ovarian, tubal, or peritoneal cancers).  Mammogram.** / Every year beginning at age 65 years and continuing for as long as you are in good health. Consult with your health care provider.  Pap test.** / Every 3 years starting at age 96 years through age 98 or 76 years with a history of 3 consecutive normal Pap tests.  HPV screening.** / Every 3 years from ages 27 years through ages 42 to 47 years with a history of 3 consecutive normal Pap tests.  Fecal occult blood test (FOBT) of stool. / Every year beginning at age 28 years and continuing until age 24 years. You may not need to do this test if you get a colonoscopy every 10 years.  Flexible sigmoidoscopy or colonoscopy.** / Every 5 years for a flexible sigmoidoscopy or every 10 years for a colonoscopy beginning at age 101 years and continuing until age 40 years.  Hepatitis C blood test.** / For all people born from [birth year] through 1965 and any individual with known risks for hepatitis C.  Skin self-exam. / Monthly.  Influenza vaccine. / Every year.  Tetanus, diphtheria, and acellular pertussis (Tdap/Td) vaccine.** / Consult your health care provider. Pregnant women should receive 1 dose of Tdap vaccine during each pregnancy. 1 dose of Td every 10 years.  Varicella vaccine.** / Consult your health care provider. Pregnant females who do not have evidence of immunity should receive the first dose after pregnancy.  Zoster vaccine.** / 1 dose for adults aged 4 years or older.  Measles, mumps, rubella (MMR) vaccine.** / You need at least 1 dose of MMR if you were born in 1957 or later. You may also need a 2nd dose. For females of childbearing age, rubella immunity  should be determined. If there is no evidence of immunity, females who are not pregnant should be vaccinated. If there is no evidence of immunity, females who are pregnant should delay immunization until after pregnancy.  Pneumococcal 13-valent conjugate (PCV13) vaccine.** / Consult your health care provider.  Pneumococcal polysaccharide (PPSV23) vaccine.** / 1 to 2 doses if you smoke cigarettes or if you have certain conditions.  Meningococcal vaccine.** / Consult your health care provider.  Hepatitis A vaccine.** / Consult your health care provider.  Hepatitis B vaccine.** / Consult your health care provider.  Haemophilus influenzae type b (Hib) vaccine.** / Consult your health care provider.  Ages 21 years and over  Blood pressure  check.** / Every 1 to 2 years.  Lipid and cholesterol check.** / Every 5 years beginning at age 82 years.  Lung cancer screening. / Every year if you are aged 48 80 years and have a 30-pack-year history of smoking and currently smoke or have quit within the past 15 years. Yearly screening is stopped once you have quit smoking for at least 15 years or develop a health problem that would prevent you from having lung cancer treatment.  Clinical breast exam.** / Every year after age 91 years.  BRCA-related cancer risk assessment.** / For women who have family members with a BRCA-related cancer (breast, ovarian, tubal, or peritoneal cancers).  Mammogram.** / Every year beginning at age 3 years and continuing for as long as you are in good health. Consult with your health care provider.  Pap test.** / Every 3 years starting at age 94 years through age 33 or 56 years with 3 consecutive normal Pap tests. Testing can be stopped between 65 and 70 years with 3 consecutive normal Pap tests and no abnormal Pap or HPV tests in the past 10 years.  HPV screening.** / Every 3 years from ages 41 years through ages 38 or 37 years with a history of 3 consecutive normal Pap  tests. Testing can be stopped between 65 and 70 years with 3 consecutive normal Pap tests and no abnormal Pap or HPV tests in the past 10 years.  Fecal occult blood test (FOBT) of stool. / Every year beginning at age 43 years and continuing until age 31 years. You may not need to do this test if you get a colonoscopy every 10 years.  Flexible sigmoidoscopy or colonoscopy.** / Every 5 years for a flexible sigmoidoscopy or every 10 years for a colonoscopy beginning at age 39 years and continuing until age 27 years.  Hepatitis C blood test.** / For all people born from [birth year] through 1964 and any individual with known risks for hepatitis C.  Osteoporosis screening.** / A one-time screening for women ages 75 years and over and women at risk for fractures or osteoporosis.  Skin self-exam. / Monthly.  Influenza vaccine. / Every year.  Tetanus, diphtheria, and acellular pertussis (Tdap/Td) vaccine.** / 1 dose of Td every 10 years.  Varicella vaccine.** / Consult your health care provider.  Zoster vaccine.** / 1 dose for adults aged 65 years or older.  Pneumococcal 13-valent conjugate (PCV13) vaccine.** / Consult your health care provider.  Pneumococcal polysaccharide (PPSV23) vaccine.** / 1 dose for all adults aged 99 years and older.  Meningococcal vaccine.** / Consult your health care provider.  Hepatitis A vaccine.** / Consult your health care provider.  Hepatitis B vaccine.** / Consult your health care provider.  Haemophilus influenzae type b (Hib) vaccine.** / Consult your health care provider. ** Family history and personal history of risk and conditions may change your health care provider's recommendations. Document Released: 09/26/2001 Document Revised: 05/21/2013  [facility] Patient Information 2014 [place], [place].   EXERCISE AND DIET:  We recommended that you start or continue a regular exercise program for good health. Regular exercise means any activity that makes your heart  beat faster and makes you sweat.  We recommend exercising at least 30 minutes per day at least 3 days a week, preferably 5.  We also recommend a diet low in fat and sugar / carbohydrates.  Inactivity, poor dietary choices and obesity can cause diabetes, heart attack, stroke, and kidney damage, among others.     ALCOHOL AND  SMOKING:  Women should limit their alcohol intake to no more than 7 drinks/beers/glasses of wine (combined, not each!) per week. Moderation of alcohol intake to this level decreases your risk of breast cancer and liver damage.  ( And of course, no recreational drugs are part of a healthy lifestyle.)  Also, you should not be smoking at all or even being exposed to second hand smoke. Most people know smoking can cause cancer, and various heart and lung diseases, but did you know it also contributes to weakening of your bones?  Aging of your skin?  Yellowing of your teeth and nails?   CALCIUM AND VITAMIN D:  Adequate intake of calcium and Vitamin D are recommended.  The recommendations for exact amounts of these supplements seem to change often, but generally speaking 600 mg of calcium (either carbonate or citrate) and 800 units of Vitamin D per day seems prudent. Certain women may benefit from higher intake of Vitamin D.  If you are among these women, your doctor will have told you during your visit.     PAP SMEARS:  Pap smears, to check for cervical cancer or precancers,  have traditionally been done yearly, although recent scientific advances have shown that most women can have pap smears less often.  However, every woman still should have a physical exam from her gynecologist or primary care physician every year. It will include a breast check, inspection of the vulva and vagina to check for abnormal growths or skin changes, a visual exam of the cervix, and then an exam to evaluate the size and shape of the uterus and ovaries.  And after 65 years of age, a rectal exam is indicated to  check for rectal cancers. We will also provide age appropriate advice regarding health maintenance, like when you should have certain vaccines, screening for sexually transmitted diseases, bone density testing, colonoscopy, mammograms, etc.    MAMMOGRAMS:  All women over 45 years old should have a yearly mammogram. Many facilities now offer a "3D" mammogram, which may cost around $50 extra out of pocket. If possible,  we recommend you accept the option to have the 3D mammogram performed.  It both reduces the number of women who will be called back for extra views which then turn out to be normal, and it is better than the routine mammogram at detecting truly abnormal areas.     COLONOSCOPY:  Colonoscopy to screen for colon cancer is recommended for all women at age 8.  We know, you hate the idea of the prep.  We agree, BUT, having colon cancer and not knowing it is worse!!  Colon cancer so often starts as a polyp that can be seen and removed at colonscopy, which can quite literally save your life!  And if your first colonoscopy is normal and you have no family history of colon cancer, most women don't have to have it again for 10 years.  Once every ten years, you can do something that may end up saving your life, right?  We will be happy to help you get it scheduled when you are ready.  Be sure to check your insurance coverage so you understand how much it will cost.  It may be covered as a preventative service at no cost, but you should check your particular policy.

## 2017-07-02 NOTE — Progress Notes (Signed)
Impression and Recommendations:    No diagnosis found.  Please see orders section below for further details of actions taken during this office visit.  Gross side effects, risk and benefits, and alternatives of medications discussed with patient.  Patient is aware that all medications have potential side effects and we are unable to predict every side effect or drug-drug interaction that may occur.  Expresses verbal understanding and consents to current therapy plan and treatment regiment.  1) Anticipatory Guidance: Discussed importance of wearing a seatbelt while driving, not texting while driving; sunscreen when outside along with yearly skin surveillance; eating a well balanced and modest diet; physical activity at least 25 minutes per day or 150 min/ week of moderate to intense activity.  2) Immunizations / Screenings / Labs:  All immunizations and screenings that patient agrees to, are up-to-date per recommendations or will be updated today.  Patient understands the needs for q 15mo dental and yearly vision screens which pt will schedule independently. Obtain CBC, CMP, HgA1c, Lipid panel, TSH and vit D when fasting if not already done recently.   3) Weight:   Discussed goal of losing even 5-10% of current body weight which would improve overall feelings of well being and improve objective health data significantly.   Improve nutrient density of diet through increasing intake of fruits and vegetables and decreasing saturated/trans fats, white flour products and refined sugar products.   F-up preventative CPE in 1 year. F/up sooner for chronic care management as discussed and/or prn.   No problem-specific Assessment & Plan notes found for this encounter.    No orders of the defined types were placed in this encounter.    Meds ordered this encounter  Medications  . hydroxychloroquine (PLAQUENIL) 200 MG tablet    Sig: Take 1 tablet daily by mouth.    Refill:  0     This  SmartLink is deprecated. Use AVSMEDLIST instead to display the medication list for a patient.    Please see orders placed and AVS handed out to patient at the end of our visit for further patient instructions/ counseling done pertaining to today's office visit.     Subjective:    Chief Complaint  Patient presents with  . Annual Exam    HPI: [name] is a 66 y.o. female who presents to [facility] today a yearly health maintenance exam.  Health Maintenance Summary Reviewed and updated, unless pt declines services.  Aspirin: administering 81 mg daily Tobacco History Reviewed:   Y  Alcohol:    No concerns, no excessive use Exercise Habits:   Not meeting AHA guidelines STD concerns:   none Drug Use:   None Birth control method:   n/a Menses regular:     n/a Lumps or breast concerns:     None currently.  Recently had biopsy. Breast Cancer Family History:      No Bone/ DEXA scan: Up-to-date per health maintenance -She goes for every 6 month post skin screenings with Dr. Amy Martinique.    Health Maintenance  Topic Date Due  . Pneumonia vaccines (1 of 2 - PCV13) 06/11/2017  . Tetanus Vaccine  10/15/2017  . Mammogram  05/18/2018  . Colon Cancer Screening  11/21/2022  . Flu Shot  Completed  . DEXA scan (bone density measurement)  Completed  .  Hepatitis C: One time screening is recommended by Center for Disease Control  (CDC) for  adults born from [birth year] through 1964.  Completed  . HIV Screening  Completed  . Pap Smear  Discontinued     Immunization History  Administered Date(s) Administered  . Influenza,inj,Quad PF,6+ Mos 05/23/2016, 06/01/2017  . Pneumococcal Polysaccharide-23 05/17/2005  . Td 10/16/2007      Health Maintenance  Topic Date Due  . PNA vac Low Risk Adult (1 of 2 - PCV13) 06/11/2017  . TETANUS/TDAP  10/15/2017  . MAMMOGRAM  05/18/2018  . COLONOSCOPY  11/21/2022  . INFLUENZA VACCINE  Completed  . DEXA SCAN  Completed    . Hepatitis C Screening  Completed  . HIV Screening  Completed  . PAP SMEAR  Discontinued     Wt Readings from Last 3 Encounters:  07/02/17 228 lb (103.4 kg)  05/16/17 229 lb (103.9 kg)  04/03/17 223 lb (101.2 kg)   BP Readings from Last 3 Encounters:  07/02/17 96/61  05/16/17 118/74  05/16/17 118/62   Pulse Readings from Last 3 Encounters:  07/02/17 66  05/16/17 80  05/16/17 89     Past Medical History:  Diagnosis Date  . Arthritis    Rheumatoid arthritis,   . Blood transfusion    1981  . Chronic diastolic CHF (congestive heart failure) (HCC)    diastolic   . Fibromyalgia   . Heart murmur    as a child  . Hiatal hernia    sjorgens syndrome  . OSA (obstructive sleep apnea)   . Peripheral autonomic neuropathy of unknown cause       Past Surgical History:  Procedure Laterality Date  . ABDOMINAL HYSTERECTOMY     BTL, BSO  . BREAST SURGERY     mass removal   . CHOLECYSTECTOMY    . DIAGNOSTIC LAPAROSCOPY     x3  . EXCISION OF LEFT BREAST MASS Left 03/22/2017   Performed by Donnie Mesa, MD at [facility] ORS  . KNEE ARTHROSCOPY     x2  . patotid cystectomy    . TENDON REPAIR  1980   left ankle and tibia  . TONSILLECTOMY    . TOTAL KNEE ARTHROPLASTY Right 06/21/2011   Performed by Gearlean Alf, MD at [facility] ORS  . TUBAL LIGATION        Family History  Problem Relation Age of Onset  . Alzheimer's disease Mother   . Heart attack Mother   . Hypertension Mother   . Glaucoma Mother   . Cancer Father   . Heart attack Brother   . Heart disease Brother   . Glaucoma Brother   . Hyperlipidemia Brother   . Glaucoma Brother       Social History   Substance and Sexual Activity  Drug Use No  ,   Social History   Substance and Sexual Activity  Alcohol Use Yes   Comment: less than 1 month  ,   Social History   Tobacco Use  Smoking Status Never Smoker  Smokeless Tobacco Never Used  ,   Social History   Substance and Sexual Activity  Sexual  Activity No    Current Outpatient Medications on File Prior to Visit  Medication Sig Dispense Refill  . carbamazepine (TEGRETOL) 200 MG tablet 1/2 tablet in the morning and 1 tablet in the evening (Patient taking differently: Take 100-200 mg by mouth 2 (two) times daily. 1/2 tablet in the morning and 1 tablet in the evening) 135 tablet 3  . Cholecalciferol (VITAMIN D3) 5000 units TABS Take 5,000 Units by mouth daily.     . cycloSPORINE (RESTASIS)  0.05 % ophthalmic emulsion Place 2 drops into both eyes 2 (two) times daily.     . diphenoxylate-atropine (LOMOTIL) 2.5-0.025 MG per tablet Take 1 tablet by mouth 4 (four) times daily as needed for diarrhea or loose stools.    . gabapentin (NEURONTIN) 300 MG capsule Take 600-900 mg by mouth 2 (two) times daily. Take 2 capsules in the morning and 3 capsules at bedtime     . HYDROcodone-acetaminophen (NORCO/VICODIN) 5-325 MG tablet Take 1 tablet by mouth at bedtime as needed. 30 tablet 0  . lidocaine (LIDODERM) 5 % Place 1 patch onto the skin daily. Remove & Discard patch within 12 hours or as directed by MD 30 patch 0  . metoprolol succinate (TOPROL-XL) 25 MG 24 hr tablet TAKE 1 TABLET BY MOUTH DAILY. (Patient taking differently: TAKE 1 TABLET BY MOUTH DAILY AT NIGHT) 90 tablet 2  . Multiple Vitamin (MULTIVITAMIN WITH MINERALS) TABS tablet Take 1 tablet by mouth daily.    . pantoprazole (PROTONIX) 40 MG tablet Take 40 mg by mouth daily before breakfast.     . potassium chloride 20 MEQ TBCR Take 40 mEq by mouth 3 (three) times daily. 180 tablet 11  . Probiotic Product ([place]) 170 MG CAPS Take 1 capsule by mouth every morning.    . promethazine (PHENERGAN) 25 MG tablet TAKE 1 TABLET BY MOUTH EVERY 6 HOURS AS NEEDED. (Patient taking differently: TAKE 1 TABLET BY MOUTH EVERY 6 HOURS AS NEEDED FOR NAUSEA/VOMITING) 120 tablet 2  . silver sulfADIAZINE (SILVADENE) 1 % cream Apply 1 application topically daily as needed (for skin  infection/irritation.).     [name] spironolactone (ALDACTONE) 25 MG tablet Take 25 mg daily by mouth. Patient will increase to 25 mg BID on 06/28/17,if tolerating once a day.    . sulfaSALAzine (AZULFIDINE) 500 MG EC tablet TAKE 2 TABLETS BY MOUTH 2 TIMES A DAY. 360 tablet 0  . tiZANidine (ZANAFLEX) 4 MG tablet TAKE 1 TABLET BY MOUTH DAILY AT BEDTIME 90 tablet 1  . torsemide (DEMADEX) 20 MG tablet Take 2 tablets (40 mg total) by mouth 2 (two) times daily. 120 tablet 11  . triamcinolone ointment (KENALOG) 0.1 % To affected areas of itching Three times daily (Patient taking differently: Apply 1 application topically 3 (three) times daily as needed (FOR ITCHY SKIN.). ) 80 g 1  . hydroxychloroquine (PLAQUENIL) 200 MG tablet Take 1 tablet daily by mouth.  0   No current facility-administered medications on file prior to visit.     Allergies: Cymbalta [duloxetine hcl]; Demerol; Ivp dye [iodinated diagnostic agents]; Morphine and related; Spironolactone; and Adhesive [tape]  Review of Systems: General:   Denies fever, chills, unexplained weight loss.  Optho/Auditory:   Denies visual changes, blurred vision/LOV Respiratory:   Denies SOB, DOE more than baseline levels.  Cardiovascular:   Denies chest pain, palpitations, new onset peripheral edema  Gastrointestinal:   Denies nausea, vomiting, diarrhea.  Genitourinary: Denies dysuria, freq/ urgency, flank pain or discharge from genitals.  Endocrine:     Denies hot or cold intolerance, polyuria, polydipsia. Musculoskeletal:   Denies unexplained myalgias, joint swelling, unexplained arthralgias, gait problems.  Skin:  Denies rash, suspicious lesions Neurological:     Denies dizziness, unexplained weakness, numbness  Psychiatric/Behavioral:   Denies mood changes, suicidal or homicidal ideations, hallucinations    Objective:    Blood pressure 96/61, pulse 66, height 5\' 2"  (1.575 m), weight 228 lb (103.4 kg). Body mass index is 41.7 kg/m. General  Appearance:  Alert, cooperative, no distress, appears stated age  Head:    Normocephalic, without obvious abnormality, atraumatic  Eyes:    PERRL, conjunctiva/corneas clear, EOM's intact, fundi    benign, both eyes  Ears:    Normal TM's and external ear canals, both ears  Nose:   Nares normal, septum midline, mucosa normal, no drainage    or sinus tenderness  Throat:   Lips w/o lesion, mucosa moist, and tongue normal; teeth and   gums normal  Neck:   Supple, symmetrical, trachea midline, no adenopathy;    thyroid:  no enlargement/tenderness/nodules; no carotid   bruit or JVD  Back:     Symmetric, no curvature, ROM normal, no CVA tenderness  Lungs:     Clear to auscultation bilaterally, respirations unlabored, no       Wh/ R/ R  Chest Wall:    No tenderness or gross deformity; normal excursion   Heart:    Regular rate and rhythm, S1 and S2 normal, no murmur, rub   or gallop  Breast Exam:    No tenderness, masses, or nipple abnormality b/l; no d/c  Abdomen:     Soft, non-tender, bowel sounds active all four quadrants, NO   G/R/R, no masses, no organomegaly  Genitalia:    Ext genitalia: without lesion, no rash or discharge, No         tenderness;  Cervix: WNL's w/o discharge or lesion;        Adnexa:  No tenderness or palpable masses   Rectal:    Normal tone, no masses or tenderness;   guaiac negative stool  Extremities:   Extremities normal, atraumatic, no cyanosis or gross edema  Pulses:   2+ and symmetric all extremities  Skin:   Warm, dry, Skin color, texture, turgor normal, no obvious rashes or lesions Psych: No HI/SI, judgement and insight good, Euthymic mood. Full Affect.  Neurologic:   CNII-XII intact, normal strength, sensation and reflexes    Throughout   DEXA:    [email or web address]   Based on the [place] FRAX tool, a 65 year old white woman with no other risk factors has a 9.3% 10-year risk for any osteoporotic fracture. White women between the ages of 9 and 64  years with equivalent or greater 10-year fracture risks based on specific risk factors include but are not limited to the following persons: 33) a 65 year old current smoker with a BMI less than 21 kg/m2, daily alcohol use, and parental fracture history; 44) a 65 year old woman with a parental fracture history; 18) a 65 year old woman with a BMI less than 21 kg/m2 and daily alcohol use; and 65) a 65 year old current smoker with daily alcohol use.   The FRAX tool also predicts 10-year fracture risks for black, Asian, and Hispanic women in the [place]. In general, estimated fracture risks in nonwhite women are lower than those for white women of the same age. Although the USPSTF recommends using a 9.3% 10-year fracture risk threshold to screen women aged 33 to 106 years,

## 2017-07-03 LAB — LIPID PANEL
CHOL/HDL RATIO: 5 ratio — AB (ref 0.0–4.4)
Cholesterol, Total: 249 mg/dL — ABNORMAL HIGH (ref 100–199)
HDL: 50 mg/dL (ref >39)
LDL CALC: 138 mg/dL — AB (ref 0–99)
Triglycerides: 304 mg/dL — ABNORMAL HIGH (ref 0–149)
VLDL Cholesterol Cal: 61 mg/dL — ABNORMAL HIGH (ref 5–40)

## 2017-07-03 LAB — CBC WITH DIFFERENTIAL/PLATELET
BASOS ABS: 0.1 10*3/uL (ref 0.0–0.2)
Basos: 1 %
EOS (ABSOLUTE): 0.1 10*3/uL (ref 0.0–0.4)
Eos: 1 %
Hematocrit: 38.9 % (ref 34.0–46.6)
Hemoglobin: 13.4 g/dL (ref 11.1–15.9)
Immature Grans (Abs): 0 10*3/uL (ref 0.0–0.1)
Immature Granulocytes: 0 %
LYMPHS ABS: 1.7 10*3/uL (ref 0.7–3.1)
Lymphs: 27 %
MCH: 30.3 pg (ref 26.6–33.0)
MCHC: 34.4 g/dL (ref 31.5–35.7)
MCV: 88 fL (ref 79–97)
MONOCYTES: 10 %
Monocytes Absolute: 0.6 10*3/uL (ref 0.1–0.9)
Neutrophils Absolute: 3.8 10*3/uL (ref 1.4–7.0)
Neutrophils: 61 %
PLATELETS: 176 10*3/uL (ref 150–379)
RBC: 4.42 x10E6/uL (ref 3.77–5.28)
RDW: 13.3 % (ref 12.3–15.4)
WBC: 6.3 10*3/uL (ref 3.4–10.8)

## 2017-07-03 LAB — VITAMIN B12: VITAMIN B 12: 491 pg/mL (ref 232–1245)

## 2017-07-03 LAB — VITAMIN D 25 HYDROXY (VIT D DEFICIENCY, FRACTURES): Vit D, 25-Hydroxy: 36.3 ng/mL (ref 30.0–100.0)

## 2017-07-03 LAB — COMPREHENSIVE METABOLIC PANEL
A/G RATIO: 1.8 (ref 1.2–2.2)
ALT: 15 IU/L (ref 0–32)
AST: 17 IU/L (ref 0–40)
Albumin: 4.4 g/dL (ref 3.6–4.8)
Alkaline Phosphatase: 134 IU/L — ABNORMAL HIGH (ref 39–117)
BUN/Creatinine Ratio: 27 (ref 12–28)
BUN: 23 mg/dL (ref 8–27)
Bilirubin Total: 0.3 mg/dL (ref 0.0–1.2)
CALCIUM: 9.4 mg/dL (ref 8.7–10.3)
CO2: 27 mmol/L (ref 20–29)
CREATININE: 0.84 mg/dL (ref 0.57–1.00)
Chloride: 101 mmol/L (ref 96–106)
GFR calc Af Amer: 84 mL/min/{1.73_m2} (ref >59)
GFR, EST NON AFRICAN AMERICAN: 73 mL/min/{1.73_m2} (ref >59)
GLUCOSE: 98 mg/dL (ref 65–99)
Globulin, Total: 2.5 g/dL (ref 1.5–4.5)
POTASSIUM: 4 mmol/L (ref 3.5–5.2)
Sodium: 144 mmol/L (ref 134–144)
Total Protein: 6.9 g/dL (ref 6.0–8.5)

## 2017-07-03 LAB — HEMOGLOBIN A1C
Est. average glucose Bld gHb Est-mCnc: 120 mg/dL
HEMOGLOBIN A1C: 5.8 % — AB (ref 4.8–5.6)

## 2017-07-03 LAB — TSH: TSH: 1.25 u[IU]/mL (ref 0.450–4.500)

## 2017-07-11 NOTE — Progress Notes (Signed)
Cardiology Office Note   Date:  07/12/2017   ID:  [name], DOB [birth year], MRN [id number]  PCP:  Mellody Dance, DO  Cardiologist:  Dr. Radford Pax    Chief Complaint  Patient presents with  . Leg Swelling      History of Present Illness: [name] is a 65 y.o. female who presents for edema.  She has a hx of PVC's, chronic diastolic CHF, DCM with normalization of LVF with EF 60-65%, obesity and OSA on CPAP.  She has had some problems with her potassium being low despite significant potassium repletion.  She is doing well with her CPAP device and thinks that she has gotten used to it.  She tolerates the full face mask and feels the pressure is adequate  On last visit her wt was up 10 lbs and + edema.  She was changed from lasix and metolazone to demadex 20 mg BID.  And aldactone 25 mg  Added.    She originally did not tolerate the aldactone.  She had stopped then saw Dr.Goldsborough with [facility] and plan was to try again, so pt began slowly and is now at 25 mg BID and tolerating.  She is not sure the torsemide is helping but her wt is labile up and down 3-6 lbs in a week.  We discussed options.  No chest pain and no SOB.  She cannot exercise so we discussed water exercise.  She will check on this.  Last K+ was 4.0.    Pt has 10 meq of Kdur and takes 40 meq TID.      Past Medical History:  Diagnosis Date  . Arthritis    Rheumatoid arthritis,   . Blood transfusion    1981  . Chronic diastolic CHF (congestive heart failure) (HCC)    diastolic   . Fibromyalgia   . Heart murmur    as a child  . Hiatal hernia    sjorgens syndrome  . OSA (obstructive sleep apnea)   . Peripheral autonomic neuropathy of unknown cause     Past Surgical History:  Procedure Laterality Date  . ABDOMINAL HYSTERECTOMY     BTL, BSO  . BREAST SURGERY     mass removal   . CHOLECYSTECTOMY    . DIAGNOSTIC LAPAROSCOPY     x3  . KNEE ARTHROSCOPY     x2  . MASS EXCISION Left  03/22/2017   Procedure: EXCISION OF LEFT BREAST MASS;  Surgeon: Donnie Mesa, MD;  Location: [facility];  Service: General;  Laterality: Left;  . patotid cystectomy    . TENDON REPAIR  1980   left ankle and tibia  . TONSILLECTOMY    . TOTAL KNEE ARTHROPLASTY  06/21/2011   Procedure: TOTAL KNEE ARTHROPLASTY;  Surgeon: Gearlean Alf;  Location: [facility];  Service: Orthopedics;  Laterality: Right;  . TUBAL LIGATION       Current Outpatient Medications  Medication Sig Dispense Refill  . carbamazepine (TEGRETOL) 200 MG tablet 1/2 tablet in the morning and 1 tablet in the evening (Patient taking differently: Take 100-200 mg by mouth 2 (two) times daily. 1/2 tablet in the morning and 1 tablet in the evening) 135 tablet 3  . Cholecalciferol (VITAMIN D3) 5000 units TABS Take 5,000 Units by mouth daily.     . cycloSPORINE (RESTASIS) 0.05 % ophthalmic emulsion Place 2 drops into both eyes 2 (two) times daily.     . diphenoxylate-atropine (LOMOTIL) 2.5-0.025 MG per tablet Take 1 tablet  by mouth 4 (four) times daily as needed for diarrhea or loose stools.    . gabapentin (NEURONTIN) 300 MG capsule Take 600-900 mg by mouth 2 (two) times daily. Take 2 capsules in the morning and 3 capsules at bedtime     . HYDROcodone-acetaminophen (NORCO/VICODIN) 5-325 MG tablet Take 1 tablet by mouth at bedtime as needed. 30 tablet 0  . hydroxychloroquine (PLAQUENIL) 200 MG tablet Take 1 tablet by mouth 2 (two) times daily. Monday though Friday  0  . lidocaine (LIDODERM) 5 % Place 1 patch onto the skin daily. Remove & Discard patch within 12 hours or as directed by MD 30 patch 0  . metoprolol succinate (TOPROL-XL) 25 MG 24 hr tablet TAKE 1 TABLET BY MOUTH DAILY. (Patient taking differently: TAKE 1 TABLET BY MOUTH DAILY AT NIGHT) 90 tablet 2  . Multiple Vitamin (MULTIVITAMIN WITH MINERALS) TABS tablet Take 1 tablet by mouth daily.    . pantoprazole (PROTONIX) 40 MG tablet Take 40 mg by mouth daily before breakfast.     .  potassium chloride 20 MEQ TBCR Take 40 mEq by mouth 3 (three) times daily. 180 tablet 11  . Probiotic Product (Keyes) 170 MG CAPS Take 1 capsule by mouth every morning.    . promethazine (PHENERGAN) 25 MG tablet TAKE 1 TABLET BY MOUTH EVERY 6 HOURS AS NEEDED. (Patient taking differently: TAKE 1 TABLET BY MOUTH EVERY 6 HOURS AS NEEDED FOR NAUSEA/VOMITING) 120 tablet 2  . silver sulfADIAZINE (SILVADENE) 1 % cream Apply 1 application topically daily as needed (for skin infection/irritation.).     [name] spironolactone (ALDACTONE) 25 MG tablet Take 25 mg daily by mouth. Patient will increase to 25 mg BID on 06/28/17,if tolerating once a day.    [name] tiZANidine (ZANAFLEX) 4 MG tablet TAKE 1 TABLET BY MOUTH DAILY AT BEDTIME 90 tablet 1  . torsemide (DEMADEX) 20 MG tablet Take 2 tablets (40 mg total) by mouth 2 (two) times daily. 120 tablet 11  . triamcinolone ointment (KENALOG) 0.1 % To affected areas of itching Three times daily (Patient taking differently: Apply 1 application topically 3 (three) times daily as needed (FOR ITCHY SKIN.). ) 80 g 1   No current facility-administered medications for this visit.     Allergies:   Sulfa antibiotics; Cymbalta [duloxetine hcl]; Demerol; Ivp dye [iodinated diagnostic agents]; Morphine and related; and Adhesive [tape]    Social History:  The patient  reports that  has never smoked. she has never used smokeless tobacco. She reports that she drinks alcohol. She reports that she does not use drugs.   Family History:  The patient's family history includes Alzheimer's disease in her mother; Cancer in her father; Glaucoma in her brother, brother, and mother; Heart attack in her brother and mother; Heart disease in her brother; Hyperlipidemia in her brother; Hypertension in her mother.    ROS:  General:no colds or fevers, no weight changes Skin:no rashes or ulcers HEENT:no blurred vision, no congestion CV:see HPI PUL:see HPI GI:no diarrhea constipation  or melena, no indigestion GU:no hematuria, no dysuria MS:no joint pain, no claudication, + RA and raynaud's  Neuro:no syncope, no lightheadedness Endo:no diabetes, no thyroid disease  Wt Readings from Last 3 Encounters:  07/12/17 229 lb (103.9 kg)  07/02/17 228 lb (103.4 kg)  05/16/17 229 lb (103.9 kg)     PHYSICAL EXAM: VS:  BP 102/72   Pulse (!) 55   Resp 16   Ht 5\' 1"  (1.549 m)   Wt  229 lb (103.9 kg)   SpO2 97%   BMI 43.27 kg/m  , BMI Body mass index is 43.27 kg/m. General:Pleasant affect, NAD Skin:Warm and dry, brisk capillary refill HEENT:normocephalic, sclera clear, mucus membranes moist Neck:supple, no JVD, no bruits  Heart:S1S2 RRR without murmur, gallup, rub or click Lungs:clear without rales, rhonchi, or wheezes DEY:CXKG, non tender, + BS, do not palpate liver spleen or masses Ext:no to trace lower ext edema, 2+ pedal pulses, 2+ radial pulses Neuro:alert and oriented X 3, MAE, follows commands, + facial symmetry    EKG:  EKG is NOT ordered today.    Recent Labs: 07/02/2017: ALT 15; BUN 23; Creatinine, Ser 0.84; Hemoglobin 13.4; Platelets 176; Potassium 4.0; Sodium 144; TSH 1.250    Lipid Panel    Component Value Date/Time   CHOL 249 (H) 07/02/2017 1415   TRIG 304 (H) 07/02/2017 1415   HDL 50 07/02/2017 1415   CHOLHDL 5.0 (H) 07/02/2017 1415   CHOLHDL 4.7 08/11/2016 1017   VLDL 59 (H) 08/11/2016 1017   LDLCALC 138 (H) 07/02/2017 1415       Other studies Reviewed: Additional studies/ records that were reviewed today include: . ECHO 02/16/17 Study Conclusions  - Left ventricle: The cavity size was normal. Wall thickness was   increased in a pattern of moderate LVH. Systolic function was   normal. The estimated ejection fraction was in the range of 60%   to 65%. Wall motion was normal; there were no regional wall   motion abnormalities. Doppler parameters are consistent with   abnormal left ventricular relaxation (grade 1 diastolic    dysfunction). - Mitral valve: Mildly calcified annulus.   ASSESSMENT AND PLAN:  1.  Chronic diastolic HF now back on aldactone and is on torsemide will check with Dr. Radford Pax if pt could take PRN metolazone with the torsemide.  Follow up with Dr. Radford Pax.    2.  Hypokalemia will check BMP today with addition of aldactone.  Has seen Nephrology  3.  PVCs stable  4.  Increased raynaud's I have asked her to follow with rheumatology for this - they are following the RA     5.  Morbid obesity, needs wt loss and exercise, water aerobics would be beneficial with her joint issues.   Current medicines are reviewed with the patient today.  The patient Has no concerns regarding medicines.  The following changes have been made:  See above Labs/ tests ordered today include:see above  Disposition:   FU:  see above  Signed, Cecilie Kicks, NP  07/12/2017 10:09 AM    [facility], [place] [place], [place], [place] Phone: [phone]; Fax: [phone]

## 2017-07-12 ENCOUNTER — Encounter: Payer: Self-pay | Admitting: Cardiology

## 2017-07-12 ENCOUNTER — Ambulatory Visit: Payer: PPO | Admitting: Cardiology

## 2017-07-12 VITALS — BP 102/72 | HR 55 | Resp 16 | Ht 61.0 in | Wt 229.0 lb

## 2017-07-12 DIAGNOSIS — I5032 Chronic diastolic (congestive) heart failure: Secondary | ICD-10-CM

## 2017-07-12 DIAGNOSIS — I493 Ventricular premature depolarization: Secondary | ICD-10-CM | POA: Diagnosis not present

## 2017-07-12 DIAGNOSIS — I7300 Raynaud's syndrome without gangrene: Secondary | ICD-10-CM

## 2017-07-12 DIAGNOSIS — E876 Hypokalemia: Secondary | ICD-10-CM

## 2017-07-12 LAB — BASIC METABOLIC PANEL
BUN/Creatinine Ratio: 21 (ref 12–28)
BUN: 21 mg/dL (ref 8–27)
CO2: 25 mmol/L (ref 20–29)
CREATININE: 1 mg/dL (ref 0.57–1.00)
Calcium: 9.3 mg/dL (ref 8.7–10.3)
Chloride: 97 mmol/L (ref 96–106)
GFR, EST AFRICAN AMERICAN: 68 mL/min/{1.73_m2} (ref >59)
GFR, EST NON AFRICAN AMERICAN: 59 mL/min/{1.73_m2} — AB (ref >59)
Glucose: 97 mg/dL (ref 65–99)
Potassium: 4.1 mmol/L (ref 3.5–5.2)
SODIUM: 139 mmol/L (ref 134–144)

## 2017-07-12 NOTE — Patient Instructions (Addendum)
Medication Instructions:  None ordered  Labwork: TODAY:  BMET  Testing/Procedures: None ordered  Follow-Up: Your physician recommends that you schedule a follow-up appointment in: [place]    Any Other Special Instructions Will Be Listed Below (If Applicable). THESE ARE THE OVER THE COUNTER MEDICATIONS THAT YOU CAN USE: [facility]  If you need a refill on your cardiac medications before your next appointment, please call your pharmacy.

## 2017-07-17 ENCOUNTER — Ambulatory Visit: Payer: PPO | Admitting: Family Medicine

## 2017-07-17 ENCOUNTER — Encounter: Payer: Self-pay | Admitting: Family Medicine

## 2017-07-17 VITALS — BP 121/78 | HR 68 | Ht 61.0 in | Wt 229.9 lb

## 2017-07-17 DIAGNOSIS — R7302 Impaired glucose tolerance (oral): Secondary | ICD-10-CM | POA: Diagnosis not present

## 2017-07-17 DIAGNOSIS — E559 Vitamin D deficiency, unspecified: Secondary | ICD-10-CM | POA: Diagnosis not present

## 2017-07-17 DIAGNOSIS — Z8249 Family history of ischemic heart disease and other diseases of the circulatory system: Secondary | ICD-10-CM | POA: Insufficient documentation

## 2017-07-17 DIAGNOSIS — R748 Abnormal levels of other serum enzymes: Secondary | ICD-10-CM

## 2017-07-17 DIAGNOSIS — Z8639 Personal history of other endocrine, nutritional and metabolic disease: Secondary | ICD-10-CM | POA: Diagnosis not present

## 2017-07-17 DIAGNOSIS — E782 Mixed hyperlipidemia: Secondary | ICD-10-CM | POA: Diagnosis not present

## 2017-07-17 HISTORY — DX: Family history of ischemic heart disease and other diseases of the circulatory system: Z82.49

## 2017-07-17 MED ORDER — ATORVASTATIN CALCIUM 20 MG PO TABS: 20.0000 mg | ORAL_TABLET | Freq: Every day | ORAL | 3 refills | Status: DC

## 2017-07-17 MED ORDER — VITAMIN D (ERGOCALCIFEROL) 1.25 MG (50000 UNIT) PO CAPS: 50000.0000 [IU] | ORAL_CAPSULE | ORAL | 10 refills | Status: DC

## 2017-07-17 NOTE — Patient Instructions (Addendum)
Recheck ALT in 4-6 weeks.   Risk factors for prediabetes and type 2 diabetes  Researchers don't fully understand why some people develop prediabetes and type 2 diabetes and others don't.  It's clear that certain factors increase the risk, however, including:  Weight. The more fatty tissue you have, the more resistant your cells become to insulin.  Inactivity. The less active you are, the greater your risk. Physical activity helps you control your weight, uses up glucose as energy and makes your cells more sensitive to insulin.  Family history. Your risk increases if a parent or sibling has type 2 diabetes.  Race. Although it's unclear why, people of certain races - including blacks, Hispanics, American Indians and Asian-Americans - are at higher risk.  Age. Your risk increases as you get older. This may be because you tend to exercise less, lose muscle mass and gain weight as you age. But type 2 diabetes is also increasing dramatically among children, adolescents and younger adults.  Gestational diabetes. If you developed gestational diabetes when you were pregnant, your risk of developing prediabetes and type 2 diabetes later increases. If you gave birth to a baby weighing more than 9 pounds (4 kilograms), you're also at risk of type 2 diabetes.  Polycystic ovary syndrome. For women, having polycystic ovary syndrome - a common condition characterized by irregular menstrual periods, excess hair growth and obesity - increases the risk of diabetes.  High blood pressure. Having blood pressure over 140/90 millimeters of mercury (mm Hg) is linked to an increased risk of type 2 diabetes.  Abnormal cholesterol and triglyceride levels. If you have low levels of high-density lipoprotein (HDL), or "good," cholesterol, your risk of type 2 diabetes is higher. Triglycerides are another type of fat carried in the blood. People with high levels of triglycerides have an increased risk of type 2 diabetes. Your doctor  can let you know what your cholesterol and triglyceride levels are.  A good guide to good carbs: The glycemic index ---If you have diabetes, or at risk for diabetes, you know all too well that when you eat carbohydrates, your blood sugar goes up. The total amount of carbs you consume at a meal or in a snack mostly determines what your blood sugar will do. But the food itself also plays a role. A serving of white rice has almost the same effect as eating pure table sugar - a quick, high spike in blood sugar. A serving of lentils has a slower, smaller effect.  ---Picking good sources of carbs can help you control your blood sugar and your weight. Even if you don't have diabetes, eating healthier carbohydrate-rich foods can help ward off a host of chronic conditions, from heart disease to various cancers to, well, diabetes.  ---One way to choose foods is with the glycemic index (GI). This tool measures how much a food boosts blood sugar.  The glycemic index rates the effect of a specific amount of a food on blood sugar compared with the same amount of pure glucose. A food with a glycemic index of 28 boosts blood sugar only 28% as much as pure glucose. One with a GI of 95 acts like pure glucose.    High glycemic foods result in a quick spike in insulin and blood sugar (also known as blood glucose).  Low glycemic foods have a slower, smaller effect- these are healthier for you.   Using the glycemic index Using the glycemic index is easy: choose foods in the low GI  category instead of those in the high GI category (see below), and go easy on those in between. Low glycemic index (GI of 55 or less): Most fruits and vegetables, beans, minimally processed grains, pasta, low-fat dairy foods, and nuts.  Moderate glycemic index (GI 56 to 69): White and sweet potatoes, corn, white rice, couscous, breakfast cereals such as Cream of Wheat and Mini Wheats.  High glycemic index (GI of 70 or higher): White bread, rice  cakes, most crackers, bagels, cakes, doughnuts, croissants, most packaged breakfast cereals. You can see the values for 100 commons foods and get links to more at www.[email or web address].si.  Swaps for lowering glycemic index  Instead of this high-glycemic index food Eat this lower-glycemic index food  White rice Brown rice or converted rice  Instant oatmeal Steel-cut oats  Cornflakes Bran flakes  Baked potato Pasta, bulgur  White bread Whole-grain bread  Corn Peas or leafy greens       Prediabetes Eating Plan  Prediabetes--also called impaired glucose tolerance or impaired fasting glucose--is a condition that causes blood sugar (blood glucose) levels to be higher than normal. Following a healthy diet can help to keep prediabetes under control. It can also help to lower the risk of type 2 diabetes and heart disease, which are increased in people who have prediabetes. Along with regular exercise, a healthy diet:  Promotes weight loss.  Helps to control blood sugar levels.  Helps to improve the way that the body uses insulin.   WHAT DO I NEED TO KNOW ABOUT THIS EATING PLAN?   Use the glycemic index (GI) to plan your meals. The index tells you how quickly a food will raise your blood sugar. Choose low-GI foods. These foods take a longer time to raise blood sugar.  Pay close attention to the amount of carbohydrates in the food that you eat. Carbohydrates increase blood sugar levels.  Keep track of how many calories you take in. Eating the right amount of calories will help you to achieve a healthy weight. Losing about 7 percent of your starting weight can help to prevent type 2 diabetes.  You may want to follow a Mediterranean diet. This diet includes a lot of vegetables, lean meats or fish, whole grains, fruits, and healthy oils and fats.   WHAT FOODS CAN I EAT?  Grains Whole grains, such as whole-wheat or whole-grain breads, crackers, cereals, and pasta. Unsweetened  oatmeal. Bulgur. Barley. Quinoa. Brown rice. Corn or whole-wheat flour tortillas or taco shells. Vegetables Lettuce. Spinach. Peas. Beets. Cauliflower. Cabbage. Broccoli. Carrots. Tomatoes. Squash. Eggplant. Herbs. Peppers. Onions. Cucumbers. Brussels sprouts. Fruits Berries. Bananas. Apples. Oranges. Grapes. Papaya. Mango. Pomegranate. Kiwi. Grapefruit. Cherries. Meats and Other Protein Sources Seafood. Lean meats, such as chicken and [place] or lean cuts of pork and beef. Tofu. Eggs. Nuts. Beans. Dairy Low-fat or fat-free dairy products, such as yogurt, cottage cheese, and cheese. Beverages Water. Tea. Coffee. Sugar-free or diet soda. Seltzer water. Milk. Milk alternatives, such as soy or almond milk. Condiments Mustard. Relish. Low-fat, low-sugar ketchup. Low-fat, low-sugar barbecue sauce. Low-fat or fat-free mayonnaise. Sweets and Desserts Sugar-free or low-fat pudding. Sugar-free or low-fat ice cream and other frozen treats. Fats and Oils Avocado. Walnuts. Olive oil. The items listed above may not be a complete list of recommended foods or beverages. Contact your dietitian for more options.    WHAT FOODS ARE NOT RECOMMENDED?  Grains Refined white flour and flour products, such as bread, pasta, snack foods, and cereals. Beverages Sweetened drinks, such as sweet  iced tea and soda. Sweets and Desserts Baked goods, such as cake, cupcakes, pastries, cookies, and cheesecake. The items listed above may not be a complete list of foods and beverages to avoid. Contact your dietitian for more information.   This information is not intended to replace advice given to you by your health care provider. Make sure you discuss any questions you have with your health care provider.   Document Released: 12/15/2014 Document Reviewed: 12/15/2014 Elsevier Interactive Patient Education 2016 [place] and dirty--> lower triglyceride levels more through...  1) - Beware of bad fats:  Cutting back on saturated fat (in red meat and full-fat dairy foods) and trans fats (in restaurant fried foods and commercially prepared baked goods) can lower triglycerides.  2) - Go for good carbs: Easily digested carbohydrates (such as white bread, white rice, cornflakes, and sugary sodas) give triglycerides a definite boost.   3) - Eating whole grains and cutting back on soda can help control triglycerides.  4) - Check your alcohol use. In some people, alcohol dramatically boosts triglycerides. The only way to know if this is true for you is to avoid alcohol for a few weeks and have your triglycerides tested again.  5) - Go fish. Omega-3 fats in salmon, tuna, sardines, and other fatty fish can lower triglycerides. Having fish twice a week is fine.  6) - Aim for a healthy weight. If you are overweight, losing just 5% to 10% of your weight can help drive down triglycerides.  7) - Get moving. Exercise lowers triglycerides and boosts heart-healthy HDL cholesterol.  8) - quit smoking if you do  --> for more information, see below; or go to  www.heart.org  and do a search for desired topics   For those diagnosed with high triglycerides, it's important to take action to lower your levels and improve your heart health.  Triglyceride is just a fancy word for fat - the fat in our bodies is stored in the form of triglycerides. Triglycerides are found in foods and manufactured in our bodies.  Normal triglyceride levels are defined as less than 150 mg/dL; 150 to 199 is considered borderline high; 200 to 499 is high; and 500 or higher is officially called very high. To me, anything over 150 is a red flag indicating my patient needs to take immediate steps to get the situation under control.   What is the significance of high triglycerides? High triglyceride levels make blood thicker and stickier, which means that it is more likely to form clots. Studies have shown that triglyceride levels are  associated with increased risks of cardiovascular disease and stroke - in both men and women - alone or in combination with other risk factors (high triglycerides combined with high LDL cholesterol can be a particularly deadly combination). For example, in one ground-breaking study, high triglycerides alone increased the risk of cardiovascular disease by 14 percent in men, and by 14 percent in women. But when the test subjects also had low HDL cholesterol (that's the good cholesterol) and other risk factors, high triglycerides increased the risk of disease by 32 percent in men and 76 percent in women.   Fortunately, triglycerides can sometimes be controlled with several diet and lifestyle changes.    What Factors Can Increase Triglycerides? As with cholesterol, eating too much of the wrong kinds of fats will raise your blood triglycerides.  Therefore, it's important to restrict the amounts of saturated fats and trans fats you allow into  your diet.  Triglyceride levels can also shoot up after eating foods that are high in carbohydrates or after drinking alcohol.  That's why triglyceride blood tests require an overnight fast.  If you have elevated triglycerides, it's especially important to avoid sugary and refined carbohydrates, including sugar, honey, and other sweeteners, soda and other sugary drinks, candy, baked goods, and anything made with white (refined or enriched) flour, including white bread, rolls, cereals, buns, pastries, regular pasta, and white rice.  You'll also want to limit dried fruit and fruit juice since they're dense in simple sugar.  All of these low-quality carbs cause a sudden rise in insulin, which may lead to a spike in triglycerides.  Triglycerides can also become elevated as a reaction to having diabetes, hypothyroidism, or kidney disease. As with most other heart-related factors, being overweight and inactive also contribute to abnormal triglycerides. And unfortunately, some people  have a genetic predisposition that causes them to manufacture way too much triglycerides on their own, no matter how carefully they eat.     How Can You Lower Your Triglyceride Levels? If you are diagnosed with high triglycerides, it's important to take action. There are several things you can do to help lower your triglyceride levels and improve your heart health:  --> Lose weight if you are overweight.  There is a clear correlation between obesity and high triglycerides - the heavier people are, the higher their triglyceride levels are likely to be. The good news is that losing weight can significantly lower triglycerides. In a large study of individuals with type 2 diabetes, those assigned to the "lifestyle intervention group" - which involved counseling, a low-calorie meal plan, and customized exercise program - lost 8.6% of their body weight and lowered their triglyceride levels by more than 16%. If you're overweight, find a weight loss plan that works for you and commit to shedding the pounds and getting healthier.  --> Reduce the amount of saturated fat and trans fat in your diet.  Start by avoiding or dramatically limiting butter, cream cheese, lard, sour cream, doughnuts, cakes, cookies, candy bars, regular ice cream, fried foods, pizza, cheese sauce, cream-based sauces and salad dressings, high-fat meats (including fatty hamburgers, bologna, pepperoni, sausage, bacon, salami, pastrami, spareribs, and hot dogs), high-fat cuts of beef and pork, and whole-milk dairy products.   Other ways to cut back: Choose lean meats only (including skinless chicken and [place], lean beef, lean pork), fish, and reduced-fat or fat-free dairy products.   Experiment with adding whole soy foods to your diet. Although soy itself may not reduce risk of heart disease, it replaces hazardous animal fats with healthier proteins. Choose high-quality soy foods, such as tofu, tempeh, soy milk, and edamame (whole  soybeans).  Always remove skin from poultry.  Prepare foods by baking, roasting, broiling, boiling, poaching, steaming, grilling, or stir-frying in vegetable oil.  Most stick margarines contain trans fats, and trans fats are also found in some packaged baked goods, potato chips, snack foods, fried foods, and fast food that use or create hydrogenated oils.    (All food labels must now list the amount of trans fats, right after the amount of saturated fats - good news for consumers. As a result, many food companies have now reformulated their products to be trans fat free.many, but not all! So it's still just as important to read labels and make sure the packaged foods you buy don't contain trans fats.)     If you use margarine, purchase soft-tub margarine  spreads that contain 0 grams trans fats and don't list any partially hydrogenated oils in the ingredients list. By substituting olive oil or vegetable oil for trans fats in just 2 percent of your daily calories, you can reduce your risk of heart disease by 53 percent.   There is no safe amount of trans fats, so try to keep them as far from your plate as possible.  -->  Avoid foods that are concentrated in sugar (even dried fruit and fruit juice). Sugary foods can elevate triglyceride levels in the blood, so keep them to a bare minimum.  --> Swap out refined carbohydrates for whole grains.  Refined carbohydrates - like white rice, regular pasta, and anything made with white or "enriched" flour (including white bread, rolls, cereals, buns, and crackers) - raise blood sugar and insulin levels more than fiber-rich whole grains. Higher insulin levels, in turn, can lead to a higher rise in triglycerides after a meal. So, make the switch to whole wheat bread, whole grain pasta, brown or wild rice, and whole grain versions of cereals, crackers, and other bread products. However, it's important to know that individuals with high triglycerides should moderate  even their intake of high-quality starches (since all starches raise blood sugar) - I recommend 1 to 2 servings per meal.  --> Cut way back on alcohol.  If you have high triglycerides, alcohol should be considered a rare treat - if you indulge at all, since even small amounts of alcohol can dramatically increase triglyceride levels.  --> Incorporate omega-3 fats.  Heart-healthy fish oils are especially rich in omega-3 fatty acids. In multiple studies over the past two decades, people who ate diets high in omega-3s had 30 to 40 percent reductions in heart disease. Although we don't yet know why fish oil works so well, there are several possibilities. Omega-3s seem to reduce inflammation, reduce high blood pressure, decrease triglycerides, raise HDL cholesterol, and make blood thinner and less sticky so it is less likely to clot. It's as close to a food prescription for heart health as it gets. If you have high triglycerides, I recommend eating at least three servings of one of the omega-3-rich fish every week (fatty fish is the most concentrated food form of omega three fats). If you cannot manage to eat that much fish, speak with your physician about taking fish oil capsules, which offer similar benefits.The best foods for omega-3 fatty acids include wild salmon (fresh, canned), herring, mackerel (not king), sardines, anchovies, rainbow trout, and Pacific oysters. Non-fish sources of omega-3 fats include omega-3-fortified eggs, ground flaxseed, chia seeds, walnuts, butternuts (white walnuts), seaweed, walnut oil, canola oil, and soybeans.  --> Quit smoking.  Smoking causes inflammation, not just in your lungs, but throughout your body. Inflammation can contribute to atherosclerosis, blood clots, and risk of heart attack. Smoking makes all heart health indicators worse. If you have high cholesterol, high triglycerides, or high blood pressure, smoking magnifies the danger.  --> Become more physically  active.  Even moderate exercise can help improve cholesterol, triglycerides, and blood pressure. Aerobic exercise seems to be able to stop the sharp rise of triglycerides after eating, perhaps because of a decrease in the amount of triglyceride released by the liver, or because active muscle clears triglycerides out of the blood stream more quickly than inactive muscle. If you haven't exercised regularly (or at all) for years, I recommend starting slowly, by walking at an easy pace for 15 minutes a day. Then, as you feel more  comfortable, increase the amount. Your ultimate goal should be at least 30 minutes of moderate physical activity, at least five days a week.

## 2017-07-17 NOTE — Progress Notes (Signed)
Assessment and plan:  1. Mixed hyperlipidemia   2. Family history of coronary arteriosclerosis- strong fam h/o CAD and early CAD.    3. Vitamin D deficiency   4. Morbid obesity ([place])   5. Low serum HDL   6. Elevated triglycerides with high cholesterol   7. History of diabetes mellitus   8. Glucose intolerance (impaired glucose tolerance)     Education and routine counseling performed. Handouts provided.  Cont meds; start lipitor after r/b meds d/c pt  Return for 4-6 wks reck ALT, then OV with me 9mo sooner for wt loss if desired..   Anticipatory guidance and routine counseling done re: condition, txmnt options and need for follow up. All questions of patient's were answered.   Gross side effects, risk and benefits, and alternatives of medications discussed with patient.  Patient is aware that all medications have potential side effects and we are unable to predict every sideeffect or drug-drug interaction that may occur.  Expresses verbal understanding and consents to current therapy plan and treatment regiment.  Please see AVS handed out to patient at the end of our visit for additional patient instructions/ counseling done pertaining to today's office visit.  Note: This document was prepared using Dragon voice recognition software and may include unintentional dictation errors.   ----------------------------------------------------------------------------------------------------------------------  Subjective:   CC:   Marisa DUFFis a 66y.o. female [facility] that was done.  1. All recent blood work that we ordered was reviewed with patient today.  Patient was counseled on all abnormalities and we discussed dietary and lifestyle changes that could help those values (also medications when appropriate).  Extensive  health counseling performed and all patient's concerns/ questions were addressed.     Wt Readings from Last 3 Encounters:  11/20/17 237 lb 6.4 oz (107.7 kg)  10/11/17 232 lb 4 oz (105.3 kg)  09/26/17 235 lb (106.6 kg)   BP Readings from Last 3 Encounters:  11/20/17 120/76  10/11/17 120/60  09/27/17 111/72   Pulse Readings from Last 3 Encounters:  11/20/17 76  10/11/17 84  09/27/17 86   BMI Readings from Last 3 Encounters:  11/20/17 43.42 kg/m  10/11/17 42.48 kg/m  09/26/17 42.98 kg/m     Patient Care Team    Relationship Specialty Notifications Start End  OMellody Dance DO PCP - General Family Medicine  01/19/16   DBo Merino MD Consulting Physician Rheumatology  02/02/14   TSueanne Margarita MD Consulting Physician Cardiology  05/23/16   AGaynelle Arabian MD Consulting Physician Orthopedic Surgery  05/23/16   WKathrynn Ducking MD Consulting Physician Neurology  05/23/16   MJuanita Craver MD Consulting Physician Gastroenterology  05/23/16   JMartinique Amy, MD Consulting Physician Dermatology  05/23/16   GCorliss Parish MD Consulting Physician Nephrology  07/02/17    Comment: Seen for hypokalemia.    Full medical history updated and reviewed in the office today  Patient Active Problem List   Diagnosis Date Noted  . Elevated triglycerides with high cholesterol 08/23/2016    Priority: High  . Peripheral polyneuropathy 02/02/2014    Priority: High  . Morbid obesity ([place] 11/17/2013    Priority: High  . OSA (obstructive sleep apnea)     Priority: High  . Low serum HDL 08/23/2016    Priority: Medium  . GERD (gastroesophageal reflux disease) 01/19/2016    Priority: Medium  .  Fibromyalgia     Priority: Medium  . Sjoegren syndrome 02/29/2016    Priority: Low  . On potassium wasting diuretic therapy 02/29/2016    Priority: Low  . Glucose intolerance (impaired glucose tolerance) 01/19/2016    Priority: Low  . Chronic diastolic CHF (congestive heart failure)  ([place]) 04/20/2015    Priority: Low  . Rheumatoid arthritis ([place])     Priority: Low  . Vitamin D deficiency 11/20/2017  . Mixed hyperlipidemia 11/20/2017  . Family history of coronary arteriosclerosis- strong fam h/o CAD and early CAD.  07/17/2017  . Raynaud's disease without gangrene 03/12/2017  . Abnormal weight gain 02/06/2017  . Shingles outbreak 02/06/2017  . Cyst (solitary) of breast, left 01/31/2017  . Eosinophilic esophagitis 81/27/5170  . History of hyperlipidemia 10/04/2016  . 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 History of diabetes mellitus 09/09/2016  . History of diverticulitis 09/03/2016  . Osteoporosis 09/03/2016  . Autoimmune disease ([place]) 09/02/2016  . High risk medication use 09/02/2016  . History of esophagitis 09/02/2016  . Breast cyst, left 06/14/2016  . Encounter for wellness examination 05/23/2016  . Abnormality of gait 05/09/2016  . Hypokalemia 03/22/2016  . Heart murmur   . Hiatal hernia   . PVC's (premature ventricular contractions)   . 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 Unilateral primary osteoarthritis, left knee 06/22/2011    Past Medical History:  Diagnosis Date  . Arthritis    Rheumatoid arthritis,   . Blood transfusion    1981  . Chronic diastolic CHF (congestive heart failure) ([facility])    diastolic   . Fibromyalgia   . Heart murmur    as a child  . Hiatal hernia    sjorgens syndrome  . OSA (obstructive sleep apnea)   . Peripheral autonomic neuropathy of unknown cause     Past Surgical History:  Procedure Laterality Date  . ABDOMINAL HYSTERECTOMY     BTL, BSO  . BREAST SURGERY     mass removal   . CHOLECYSTECTOMY    . DIAGNOSTIC LAPAROSCOPY     x3  . KNEE ARTHROSCOPY     x2  . MASS EXCISION Left 03/22/2017   Procedure: EXCISION OF LEFT BREAST MASS;  Surgeon: Donnie Mesa, MD;  Location: [facility];  Service: General;  Laterality: Left;  . patotid cystectomy    . TENDON REPAIR  1980   left ankle and tibia   . TONSILLECTOMY    . TOTAL KNEE ARTHROPLASTY  06/21/2011   Procedure: TOTAL KNEE ARTHROPLASTY;  Surgeon: Gearlean Alf;  Location: [facility];  Service: Orthopedics;  Laterality: Right;  . TUBAL LIGATION      Social History   Tobacco Use  . Smoking status: Never Smoker  . Smokeless tobacco: Never Used  Substance Use Topics  . Alcohol use: Yes    Comment: socially     Family Hx: Family History  Problem Relation Age of Onset  . Alzheimer's disease Mother   . Heart attack Mother   . Hypertension Mother   . Glaucoma Mother   . Cancer Father   . Heart attack Brother   . Heart disease Brother   . Glaucoma Brother   . Hyperlipidemia Brother   . Glaucoma Brother      Medications: Current Outpatient Medications  Medication Sig Dispense Refill  . cycloSPORINE (RESTASIS) 0.05 % ophthalmic emulsion Place 2 drops into both eyes 2 (two) times daily.     . diphenoxylate-atropine (LOMOTIL) 2.5-0.025 MG per tablet Take  1 tablet by mouth 4 (four) times daily as needed for diarrhea or loose stools.    . gabapentin (NEURONTIN) 300 MG capsule Take 600-900 mg by mouth See admin instructions. 600 mg by mouth in the morning and 900 mg at bedtime    . hydroxychloroquine (PLAQUENIL) 200 MG tablet Take 200 mg by mouth See admin instructions. 200 mg by mouth two times a day on Mon/Tues/Wed/Thurs/Fri only  0  . lidocaine (LIDODERM) 5 % Place 1 patch onto the skin daily. Remove & Discard patch within 12 hours or as directed by MD (Patient taking differently: Place 1 patch onto the skin daily as needed (for pain). Remove & Discard patch within 12 hours or as directed by MD) 30 patch 0  . Multiple Vitamin (MULTIVITAMIN WITH MINERALS) TABS tablet Take 1 tablet by mouth daily.    . Probiotic Product ([place]) 170 MG CAPS Take 1 capsule by mouth every morning.    . promethazine (PHENERGAN) 25 MG tablet TAKE 1 TABLET BY MOUTH EVERY 6 HOURS AS NEEDED. 120 tablet 2  . silver sulfADIAZINE  (SILVADENE) 1 % cream Apply 1 application topically daily as needed (for skin infection/irritation.).     [name] aspirin EC 81 MG tablet Take 81 mg by mouth at bedtime.    [name] atorvastatin (LIPITOR) 20 MG tablet Take 1 tablet (20 mg total) by mouth daily. 90 tablet 3  . carbamazepine (TEGRETOL) 200 MG tablet Take 200 mg by mouth at bedtime.    [name] HYDROcodone-acetaminophen (NORCO/VICODIN) 5-325 MG tablet Take 1 tablet by mouth at bedtime as needed. 30 tablet 0  . ibuprofen (ADVIL,MOTRIN) 400 MG tablet Take 1 tablet (400 mg total) by mouth every 6 (six) hours as needed. 30 tablet 0  . leflunomide (ARAVA) 20 MG tablet Take 20 mg by mouth daily.    . metolazone (ZAROXOLYN) 2.5 MG tablet Take 2.5 mg by mouth daily as needed.    . metoprolol succinate (TOPROL-XL) 25 MG 24 hr tablet TAKE 1 TABLET BY MOUTH DAILY. 90 tablet 2  . NONFORMULARY OR COMPOUNDED ITEM Triamcinolone 0.1% & Silvadene cream 1:1- Apply as directed to affected areas    . potassium chloride (K-DUR) 10 MEQ tablet Take 6 tablets (60 mEq total) by mouth every morning AND 4 tablets (40 mEq total) daily with lunch AND 4 tablets (40 mEq total) every evening. 1260 tablet 3  . spironolactone (ALDACTONE) 25 MG tablet Take 25 mg by mouth 2 (two) times daily.    [name] tiZANidine (ZANAFLEX) 4 MG tablet TAKE 1 TABLET BY MOUTH DAILY AT BEDTIME 90 tablet 1  . torsemide (DEMADEX) 20 MG tablet Take 3 tablets (60 mg total) by mouth 2 (two) times daily. 540 tablet 3  . UNABLE TO FIND CPAP: AT bedtime; setting is "12"    . Vitamin D, Ergocalciferol, (DRISDOL) 50000 units CAPS capsule Take 50,000 Units by mouth every Tuesday.      No current facility-administered medications for this visit.     Allergies:  Allergies  Allergen Reactions  . Sulfa Antibiotics Hives and Itching    Flushing also  . Cymbalta [Duloxetine Hcl] Other (See Comments)    Caused a manic reaction  . Demerol Hives and Other (See Comments)    Fever also  . Ivp Dye [Iodinated Diagnostic  Agents] Hives and Other (See Comments)    Fever also  . Morphine And Related Other (See Comments)    ineffective  . Sulfasalazine Other (See Comments)    Reaction ??  .  Adhesive [Tape] Rash     Review of Systems: General:   No F/C, wt loss Pulm:   No DIB, SOB, pleuritic chest pain Card:  No CP, palpitations Abd:  No n/v/d or pain Ext:  No inc edema from baseline  Objective:  Blood pressure 121/78, pulse 68, height '5\' 1"'$  (1.549 m), weight 229 lb 14.4 oz (104.3 kg), SpO2 99 %. Body mass index is 43.44 kg/m. Gen:   Well NAD, A and O *3 HEENT:    [place]/AT, EOMI,  MMM Lungs:   Normal work of breathing. CTA B/L, no Wh, rhonchi Heart:   RRR, S1, S2 WNL's, no MRG Abd:   No gross distention Exts:    warm, pink,  Brisk capillary refill, warm and well perfused.  Psych:    No HI/SI, judgement and insight good, Euthymic mood. Full Affect.   Recent Results (from the past 2160 hour(s))  Basic metabolic panel     Status: Abnormal   Collection Time: 08/30/17  2:42 PM  Result Value Ref Range   Glucose 109 (H) 65 - 99 mg/dL   BUN 20 8 - 27 mg/dL   Creatinine, Ser 1.07 (H) 0.57 - 1.00 mg/dL   GFR calc non Af Amer 55 (L) >59 mL/min/1.73   GFR calc Af Amer 63 >59 mL/min/1.73   BUN/Creatinine Ratio 19 12 - 28   Sodium 142 134 - 144 mmol/L   Potassium 3.5 3.5 - 5.2 mmol/L   Chloride 94 (L) 96 - 106 mmol/L   CO2 27 20 - 29 mmol/L   Calcium 9.7 8.7 - 10.3 mg/dL  CBC with Differential/Platelet     Status: None   Collection Time: 09/06/17  3:23 PM  Result Value Ref Range   WBC 7.9 3.8 - 10.8 Thousand/uL   RBC 4.63 3.80 - 5.10 Million/uL   Hemoglobin 13.7 11.7 - 15.5 g/dL   HCT 40.1 35.0 - 45.0 %   MCV 86.6 80.0 - 100.0 fL   MCH 29.6 27.0 - 33.0 pg   MCHC 34.2 32.0 - 36.0 g/dL   RDW 13.2 11.0 - 15.0 %   Platelets 213 140 - 400 Thousand/uL   MPV 11.4 7.5 - 12.5 fL   Neutro Abs 5,246 1,500 - 7,800 cells/uL   Lymphs Abs 1,699 850 - 3,900 cells/uL   WBC mixed population 822 200 - 950 cells/uL    Eosinophils Absolute 71 15 - 500 cells/uL   Basophils Absolute 63 0 - 200 cells/uL   Neutrophils Relative % 66.4 %   Total Lymphocyte 21.5 %   Monocytes Relative 10.4 %   Eosinophils Relative 0.9 %   Basophils Relative 0.8 %  COMPLETE METABOLIC PANEL WITH GFR     Status: None   Collection Time: 09/06/17  3:23 PM  Result Value Ref Range   Glucose, Bld 89 65 - 99 mg/dL    Comment: .            Fasting reference interval .    BUN 21 7 - 25 mg/dL   Creat 0.90 0.50 - 0.99 mg/dL    Comment: For patients >47 years of age, the reference limit for Creatinine is approximately 13% higher for people identified as African-American. .    GFR, Est Non African American 67 > OR = 60 mL/min/1.90m   GFR, Est African American 78 > OR = 60 mL/min/1.777m  BUN/Creatinine Ratio NOT APPLICABLE 6 - 22 (calc)   Sodium 141 135 - 146 mmol/L   Potassium 4.1 3.5 -  5.3 mmol/L   Chloride 102 98 - 110 mmol/L   CO2 29 20 - 32 mmol/L   Calcium 9.8 8.6 - 10.4 mg/dL   Total Protein 6.8 6.1 - 8.1 g/dL   Albumin 4.3 3.6 - 5.1 g/dL   Globulin 2.5 1.9 - 3.7 g/dL (calc)   AG Ratio 1.7 1.0 - 2.5 (calc)   Total Bilirubin 0.3 0.2 - 1.2 mg/dL   Alkaline phosphatase (APISO) 110 33 - 130 U/L   AST 23 10 - 35 U/L   ALT 17 6 - 29 U/L  Pain Mgmt, Profile 5 w/Conf, U     Status: None   Collection Time: 09/06/17  3:23 PM  Result Value Ref Range   Creatinine 49.7 > or = 20. mg/dL   pH 6.14 4.5 - 9.0   Oxidant NEGATIVE <200 mcg/mL   Amphetamines NEGATIVE <500 ng/mL   medMATCH Amphetamines CONSISTENT    Barbiturates NEGATIVE <300 ng/mL   medMATCH Barbiturates CONSISTENT    Benzodiazepines NEGATIVE <100 ng/mL   medMATCH Benzodiazepines CONSISTENT    Marijuana Metabolite NEGATIVE <20 ng/mL   medMATCH Marijuana Metab CONSISTENT    Cocaine Metabolite NEGATIVE <150 ng/mL   medMATCH Cocaine Metab CONSISTENT    Methadone Metabolite NEGATIVE <100 ng/mL   [facility] Methadone Metab CONSISTENT    Opiates NEGATIVE <100 ng/mL    medMATCH Opiates CONSISTENT    Oxycodone NEGATIVE <100 ng/mL   medMATCH Oxycodone CONSISTENT     Comment: This drug testing is for medical treatment only.   Analysis was performed as non-forensic testing and  these results should be used only by healthcare  providers to render diagnosis or treatment, or to  monitor progress of medical conditions. Hazel Sams comments are:  - present when drug test results may be the result of     metabolism of one or more drugs or when results are     inconsistent with prescribed medication(s) listed.  - may be blank when drug results are consistent with     prescribed medication(s) listed. . For assistance with interpreting these drug results,  please contact a [employer] Toxicology  Specialist: [phone] [place] [phone]), M-F,  8am-6pm EST.   Basic metabolic panel     Status: Abnormal   Collection Time: 09/20/17 12:37 PM  Result Value Ref Range   Sodium 138 135 - 145 mmol/L   Potassium 3.9 3.5 - 5.1 mmol/L   Chloride 102 101 - 111 mmol/L   CO2 25 22 - 32 mmol/L   Glucose, Bld 107 (H) 65 - 99 mg/dL   BUN 14 6 - 20 mg/dL   Creatinine, Ser 0.82 0.44 - 1.00 mg/dL   Calcium 9.3 8.9 - 10.3 mg/dL   GFR calc non Af Amer >60 >60 mL/min   GFR calc Af Amer >60 >60 mL/min    Comment: (NOTE) The eGFR has been calculated using the CKD EPI equation. This calculation has not been validated in all clinical situations. eGFR's persistently <60 mL/min signify possible Chronic Kidney Disease.    Anion gap 11 5 - 15    Comment: Performed at [facility] [address] [place], [place] [address]  Basic metabolic panel     Status: Abnormal   Collection Time: 09/26/17  1:22 PM  Result Value Ref Range   Sodium 140 135 - 145 mmol/L   Potassium 4.1 3.5 - 5.1 mmol/L   Chloride 101 101 - 111 mmol/L   CO2 24 22 - 32 mmol/L   Glucose,  Bld 119 (H) 65 - 99 mg/dL   BUN 17 6 - 20 mg/dL   Creatinine, Ser 1.14 (H) 0.44 - 1.00 mg/dL   Calcium  9.1 8.9 - 10.3 mg/dL   GFR calc non Af Amer 49 (L) >60 mL/min   GFR calc Af Amer 57 (L) >60 mL/min    Comment: (NOTE) The eGFR has been calculated using the CKD EPI equation. This calculation has not been validated in all clinical situations. eGFR's persistently <60 mL/min signify possible Chronic Kidney Disease.    Anion gap 15 5 - 15    Comment: Performed at [facility] [address], [place], [place] [address]  CBC     Status: Abnormal   Collection Time: 09/26/17  1:22 PM  Result Value Ref Range   WBC 11.0 (H) 4.0 - 10.5 K/uL   RBC 4.82 3.87 - 5.11 MIL/uL   Hemoglobin 14.5 12.0 - 15.0 g/dL   HCT 43.5 36.0 - 46.0 %   MCV 90.2 78.0 - 100.0 fL   MCH 30.1 26.0 - 34.0 pg   MCHC 33.3 30.0 - 36.0 g/dL   RDW 13.4 11.5 - 15.5 %   Platelets 198 150 - 400 K/uL    Comment: Performed at [facility] [address], [place], [place] [address]  Brain natriuretic peptide     Status: None   Collection Time: 09/26/17  1:25 PM  Result Value Ref Range   B Natriuretic Peptide 44.4 0.0 - 100.0 pg/mL    Comment: Performed at [facility] [address] [place], [place] [address]  I-stat troponin, ED     Status: None   Collection Time: 09/26/17  1:35 PM  Result Value Ref Range   Troponin i, poc 0.00 0.00 - 0.08 ng/mL   Comment 3            Comment: Due to the release kinetics of cTnI, a negative result within the first hours of the onset of symptoms does not rule out myocardial infarction with certainty. If myocardial infarction is still suspected, repeat the test at appropriate intervals.   I-stat troponin, ED     Status: None   Collection Time: 09/26/17  8:27 PM  Result Value Ref Range   Troponin i, poc 0.00 0.00 - 0.08 ng/mL   Comment 3            Comment: Due to the release kinetics of cTnI, a negative result within the first hours of the onset of symptoms does not rule out myocardial infarction with certainty. If myocardial infarction is still  suspected, repeat the test at appropriate intervals.   Basic metabolic panel     Status: Abnormal   Collection Time: 10/11/17 11:46 AM  Result Value Ref Range   Sodium 138 135 - 145 mmol/L   Potassium 3.8 3.5 - 5.1 mmol/L   Chloride 94 (L) 101 - 111 mmol/L   CO2 27 22 - 32 mmol/L   Glucose, Bld 126 (H) 65 - 99 mg/dL   BUN 18 6 - 20 mg/dL   Creatinine, Ser 1.08 (H) 0.44 - 1.00 mg/dL   Calcium 9.3 8.9 - 10.3 mg/dL   GFR calc non Af Amer 53 (L) >60 mL/min   GFR calc Af Amer >60 >60 mL/min    Comment: (NOTE) The eGFR has been calculated using the CKD EPI equation. This calculation has not been validated in all clinical situations. eGFR's persistently <60 mL/min signify possible Chronic Kidney Disease.  Anion gap 17 (H) 5 - 15    Comment: Performed at [facility], [place] [address], [place], [place] [address]

## 2017-07-18 ENCOUNTER — Other Ambulatory Visit: Payer: Self-pay | Admitting: Rheumatology

## 2017-07-18 NOTE — Telephone Encounter (Signed)
Patient call requesting an RX refill on Hydrocodone.  Also she wanted to let Dr. Estanislado Pandy know that her sulfa drug caused a reaction so her PCP took her off the medication.  CB#[phone].  Thank you.

## 2017-07-19 MED ORDER — HYDROCODONE-ACETAMINOPHEN 5-325 MG PO TABS: 1.0000 | ORAL_TABLET | Freq: Every evening | ORAL | 0 refills | Status: DC | PRN

## 2017-07-19 NOTE — Telephone Encounter (Signed)
ok 

## 2017-07-19 NOTE — Telephone Encounter (Signed)
Also she wanted to let Dr. Estanislado Pandy know that her sulfa drug caused a reaction so her PCP took her off the medication.  Patietn was on SSZ, Arava and PLQ   Last Visit: 04/03/17 Next Visit:09/06/17 UDS: 02/16/17 Narc Agreement: 02/16/17  Okay to refill Hydrocodone?

## 2017-07-30 ENCOUNTER — Other Ambulatory Visit: Payer: Self-pay | Admitting: Rheumatology

## 2017-07-30 NOTE — Telephone Encounter (Signed)
Last Visit: 04/03/17 Next Visit:09/06/17  Okay to refill per Dr. Estanislado Pandy

## 2017-08-13 ENCOUNTER — Other Ambulatory Visit: Payer: PPO

## 2017-08-13 ENCOUNTER — Other Ambulatory Visit: Payer: Self-pay

## 2017-08-13 DIAGNOSIS — Z79899 Other long term (current) drug therapy: Secondary | ICD-10-CM

## 2017-08-13 NOTE — Progress Notes (Signed)
alt

## 2017-08-15 ENCOUNTER — Other Ambulatory Visit (INDEPENDENT_AMBULATORY_CARE_PROVIDER_SITE_OTHER): Payer: PPO

## 2017-08-15 DIAGNOSIS — Z79899 Other long term (current) drug therapy: Secondary | ICD-10-CM | POA: Diagnosis not present

## 2017-08-16 LAB — ALT: ALT: 18 IU/L (ref 0–32)

## 2017-08-23 NOTE — Progress Notes (Signed)
Office Visit Note  Patient: [name]             Date of Birth: 04/03/1951           MRN: [id number]             PCP: Mellody Dance, DO Referring: Mellody Dance, DO Visit Date: 09/06/2017 Occupation: @GUAROCC @    Subjective:  Right hip and  Pain in hands.   History of Present Illness: [name] is a 67 y.o. female with history of some sero positive rheumatoid arthritis, Sjogren's and osteoarthritis. She had to come off [place] for about 3 months due to being and doughnut hole. She is resumed her [place] in January and is taking 20 mg a day along with Plaquenil. She tried sulfasalazine but had allergic reaction to sulfa and discontinued the medication. She states she's been having some discomfort in her hands and right hip. None of the joints or swelling. She continues to have sicca symptoms. Her right total knee replacement distal: Some discomfort. She continues to have lower back pain. She's been having increased muscle pain recently. She is uncertain if it's rated to Lipitor.  Activities of Daily Living:  Patient reports morning stiffness for 10  minutes.   Patient Reports nocturnal pain.  Difficulty dressing/grooming: Denies Difficulty climbing stairs: Reports Difficulty getting out of chair: Reports Difficulty using hands for taps, buttons, cutlery, and/or writing: Reports   Review of Systems  Constitutional: Positive for fatigue. Negative for night sweats, weight gain, weight loss and weakness.  HENT: Positive for mouth dryness. Negative for mouth sores, trouble swallowing, trouble swallowing and nose dryness.   Eyes: Positive for dryness. Negative for pain, redness and visual disturbance.  Respiratory: Negative for cough, shortness of breath and difficulty breathing.   Cardiovascular: Positive for swelling in legs/feet. Negative for chest pain, palpitations, hypertension and irregular heartbeat.  Gastrointestinal: Negative for blood in stool, constipation and  diarrhea.  Endocrine: Negative for increased urination.  Genitourinary: Negative for vaginal dryness.  Musculoskeletal: Positive for arthralgias, joint pain and morning stiffness. Negative for joint swelling, myalgias, muscle weakness, muscle tenderness and myalgias.  Skin: Negative for color change, rash, hair loss, skin tightness, ulcers and sensitivity to sunlight.  Allergic/Immunologic: Negative for susceptible to infections.  Neurological: Negative for dizziness, memory loss and night sweats.  Hematological: Negative for swollen glands.  Psychiatric/Behavioral: Positive for sleep disturbance. Negative for depressed mood. The patient is not nervous/anxious.     PMFS History:  Patient Active Problem List   Diagnosis Date Noted  . Family history of coronary arteriosclerosis- strong fam h/o CAD and early CAD.  07/17/2017  . Raynaud's disease without gangrene 03/12/2017  . Abnormal weight gain 02/06/2017  . Shingles outbreak 02/06/2017  . Cyst (solitary) of breast, left 01/31/2017  . Eosinophilic esophagitis 33/82/5053  . History of hyperlipidemia 10/04/2016  . 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 History of diabetes mellitus 09/09/2016  . History of diverticulitis 09/03/2016  . Osteoporosis 09/03/2016  . Autoimmune disease ([place]) 09/02/2016  . High risk medication use 09/02/2016  . History of esophagitis 09/02/2016  . Elevated triglycerides with high cholesterol 08/23/2016  . Low serum HDL 08/23/2016  . Breast cyst, left 06/14/2016  . Encounter for wellness examination 05/23/2016  . Abnormality of gait 05/09/2016  . Hypokalemia 03/22/2016  . Sjoegren syndrome ([place]) 02/29/2016  . On potassium wasting diuretic therapy 02/29/2016  . GERD (gastroesophageal reflux disease) 01/19/2016  . Glucose intolerance (impaired glucose tolerance) 01/19/2016  .  Chronic diastolic CHF (congestive heart failure) ([place]) 04/20/2015  . Peripheral polyneuropathy 02/02/2014  . Morbid obesity  ([place]) 11/17/2013  . Heart murmur   . OSA (obstructive sleep apnea)   . Rheumatoid arthritis ([place])   . Hiatal hernia   . Fibromyalgia   . PVC's (premature ventricular contractions)   . 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 Unilateral primary osteoarthritis, left knee 06/22/2011    Past Medical History:  Diagnosis Date  . Arthritis    Rheumatoid arthritis,   . Blood transfusion    1981  . Chronic diastolic CHF (congestive heart failure) (HCC)    diastolic   . Fibromyalgia   . Heart murmur    as a child  . Hiatal hernia    sjorgens syndrome  . OSA (obstructive sleep apnea)   . Peripheral autonomic neuropathy of unknown cause     Family History  Problem Relation Age of Onset  . Alzheimer's disease Mother   . Heart attack Mother   . Hypertension Mother   . Glaucoma Mother   . Cancer Father   . Heart attack Brother   . Heart disease Brother   . Glaucoma Brother   . Hyperlipidemia Brother   . Glaucoma Brother    Past Surgical History:  Procedure Laterality Date  . ABDOMINAL HYSTERECTOMY     BTL, BSO  . BREAST SURGERY     mass removal   . CHOLECYSTECTOMY    . DIAGNOSTIC LAPAROSCOPY     x3  . KNEE ARTHROSCOPY     x2  . MASS EXCISION Left 03/22/2017   Procedure: EXCISION OF LEFT BREAST MASS;  Surgeon: Donnie Mesa, MD;  Location: [facility];  Service: General;  Laterality: Left;  . patotid cystectomy    . TENDON REPAIR  1980   left ankle and tibia  . TONSILLECTOMY    . TOTAL KNEE ARTHROPLASTY  06/21/2011   Procedure: TOTAL KNEE ARTHROPLASTY;  Surgeon: Gearlean Alf;  Location: [facility];  Service: Orthopedics;  Laterality: Right;  . TUBAL LIGATION     Social History   Social History Narrative   Lives at home alone   Right-handed   Drinks 1 or less cups of coffee and 2 servings of either tea or soda per day     Objective: Vital Signs: BP 122/64 (BP Location: Left Arm, Patient Position: Sitting, Cuff Size: Normal)   Pulse 78   Resp 17   Ht 5\' 2"   (1.575 m)   Wt 240 lb (108.9 kg)   BMI 43.90 kg/m    Physical Exam  Constitutional: She is oriented to person, place, and time. She appears well-developed and well-nourished.  HENT:  Head: Normocephalic and atraumatic.  Eyes: Conjunctivae and EOM are normal.  Neck: Normal range of motion.  Cardiovascular: Normal rate, regular rhythm, normal heart sounds and intact distal pulses.  Pulmonary/Chest: Effort normal and breath sounds normal.  Abdominal: Soft. Bowel sounds are normal.  Lymphadenopathy:    She has no cervical adenopathy.  Neurological: She is alert and oriented to person, place, and time.  Skin: Skin is warm and dry. Capillary refill takes less than 2 seconds.  Psychiatric: She has a normal mood and affect. Her behavior is normal.  Nursing note and vitals reviewed.    Musculoskeletal Exam: C-spine good range of motion. She has limited range of motion thoracic spine with thoracic kyphosis and scoliosis. She's synovial thickening over her MCP joints without any synovitis. She has PIP/DIP thickening in her  hands and feet. She has right total knee replacement without any effusion. She is painful range of motion of her hip joints. Fibromyalgia tender points with 10 out of 18 positive.  CDAI Exam: CDAI Homunculus Exam:   Joint Counts:  CDAI Tender Joint count: 0 CDAI Swollen Joint count: 0  Global Assessments:  Patient Global Assessment: 4 Provider Global Assessment: 3  CDAI Calculated Score: 7    Investigation: No additional findings. CBC Latest Ref Rng & Units 07/02/2017 06/20/2017 03/20/2017  WBC 3.4 - 10.8 x10E3/uL 6.3 8.2 5.8  Hemoglobin 11.1 - 15.9 g/dL 13.4 14.4 13.8  Hematocrit 34.0 - 46.6 % 38.9 42.8 40.3  Platelets 150 - 379 x10E3/uL 176 290 177   CMP Latest Ref Rng & Units 08/30/2017 08/15/2017 07/12/2017  Glucose 65 - 99 mg/dL 109(H) - 97  BUN 8 - 27 mg/dL 20 - 21  Creatinine 0.57 - 1.00 mg/dL 1.07(H) - 1.00  Sodium 134 - 144 mmol/L 142 - 139  Potassium  3.5 - 5.2 mmol/L 3.5 - 4.1  Chloride 96 - 106 mmol/L 94(L) - 97  CO2 20 - 29 mmol/L 27 - 25  Calcium 8.7 - 10.3 mg/dL 9.7 - 9.3  Total Protein 6.0 - 8.5 g/dL - - -  Total Bilirubin 0.0 - 1.2 mg/dL - - -  Alkaline Phos 39 - 117 IU/L - - -  AST 0 - 40 IU/L - - -  ALT 0 - 32 IU/L - 18 -    Imaging: No results found.  Speciality Comments: No specialty comments available.    Procedures:  No procedures performed Allergies: Sulfa antibiotics; Cymbalta [duloxetine hcl]; Demerol; Ivp dye [iodinated diagnostic agents]; Morphine and related; Sulfasalazine; and Adhesive [tape]   Assessment / Plan:     Visit Diagnoses: Rheumatoid arthritis involving multiple sites with positive rheumatoid factor (HCC) - Positive RF, positive ANA. She has no synovitis on examination although she continues to have some arthralgias. She had interruption in her therapy due to insurance issues. She has resumed [place] now.  High risk medication use - Arava 20 mg po qd, PLQ 200 (Orencia IV discontinued due to cost), hydrocodone,SSZ- reactionNarc agreement: 7/6/2018UDS: 02/16/2017 - Plan: CBC with Differential/Platelet, COMPLETE METABOLIC PANEL WITH GFR, Pain Mgmt, Profile 5 w/Conf, U  Raynaud's disease without gangrene: Currently not very active.  Sjogren's syndrome with keratoconjunctivitis sicca ([place]): She continues to have sicca symptoms.  Unilateral primary osteoarthritis, left knee: Chronic pain  History of total knee replacement, right - 2012: She continues to have a lot of discomfort.  Fibromyalgia: She's having increased myalgias which she relates to statin use.  DDD (degenerative disc disease), lumbar: She continues to have lower back discomfort. She's been taking hydrocodone for pain management which is been helpful she uses sparingly.  Chronic pain: She is on hydrocodone. As She refill was given today. UDS was also obtained.  Age-related osteoporosis without current pathological fracture - Status post  [place]. Treated with Reclast infusions in the past which were discontinued due to the cost. DXA 01/2017 showed T score of -1.8 right femoral neck   History of depression  History of diabetes mellitus  History of cardiac murmur - history of PVC's  History of diverticulitis  History of hyperlipidemia  Eosinophilic esophagitis - history of and and Barretts esophagus  - Dr Collene Mares   History of peripheral neuropathy    Orders: Orders Placed This Encounter  Procedures  . CBC with Differential/Platelet  . COMPLETE METABOLIC PANEL WITH GFR  . Pain Mgmt,  Profile 5 w/Conf, U   No orders of the defined types were placed in this encounter.   Face-to-face time spent with patient was 30 minutes. Greater than 50% of time was spent in counseling and coordination of care.  Follow-Up Instructions: Return in about 5 months (around 02/04/2018) for Rheumatoid arthritis, Osteoarthritis, Osteoporosis.   Bo Merino, MD  Note - This record has been created using Editor, commissioning.  Chart creation errors have been sought, but may not always  have been located. Such creation errors do not reflect on  the standard of medical care.

## 2017-08-24 ENCOUNTER — Telehealth: Payer: Self-pay | Admitting: Cardiology

## 2017-08-24 NOTE — Telephone Encounter (Signed)
Informed patient of Dr.Turner's recommendation to follow up with PCP for LE edema as well as follow up with Dr. Radford Pax on 1/17. Patient verbalized understanding and thanked me for the call.

## 2017-08-24 NOTE — Telephone Encounter (Signed)
Returned call to patient. Patient states she went to the pharmacy to refill her potassium. Per pharmacy its to soon for a refill and they have the patient listed as taking kdur 46meq every other day. Per chart patient is taking kdur 40 mEq 3 times a day. Patient states that is how she has been taking. She stated she purchased some over the counter and will follow up with correct dosage at her follow up appt with Dr. Radford Pax on 08/30/17.   Patient also stated she has been having some increased swelling within the past week and half that is not getting better with diuretics. She denies sob or chest pain. Patient states she feels okay to wait till her follow up appt on 08/30/17. Informed patient to keep her appt and if symptoms get worse to call us a call back. Patient in agreement with plan and verbalized understanding, and thanked me for the call.

## 2017-08-24 NOTE — Telephone Encounter (Signed)
New message  Patient calling to confirm the dosage and frequency of medication. Please call  Pt c/o medication issue:  1. Name of Medication: potassium chloride 20 MEQ TBCR  2. How are you currently taking this medication (dosage and times per day)? n/a  3. Are you having a reaction (difficulty breathing--STAT)? No  4. What is your medication issue? Pt wants to confirm  how she should be taking medication.

## 2017-08-24 NOTE — Telephone Encounter (Signed)
Please have patient see her PCP early next week for LE edema.

## 2017-08-29 NOTE — Progress Notes (Signed)
Cardiology Office Note:    Date:  08/30/2017   ID:  [name], DOB 07-26-1951, MRN [id number]  PCP:  Mellody Dance, DO  Cardiologist:  No primary care provider on file.    Referring MD: Mellody Dance, DO   Chief Complaint  Patient presents with  . Congestive Heart Failure  . Sleep Apnea    History of Present Illness:    [name] is a 67 y.o. female with a hx of PVC's, chronic diastolic CHF,DCM with normalization of LVF with EF 60-65%,obesity and OSAon CPAP.  She also has had significant problems with hypokalemia despite aggressive potassium repletion.  She has had some problems with volume overload in the past and was changed from Lasix and metolazone to Demadex and aldactone to help keep potassium up.  She was seen by Cecilie Kicks, NP in November 2018 and was doing well.    She is here today for followup and is doing well.  She denies any chest pain or pressure, SOB, DOE, PND, orthopnea, dizziness, palpitations or syncope. She has been having more LE edema and has gained 6lbs.  She is compliant with her meds and is tolerating meds with no SE.  She is doing well with her CPAP device.  She tolerates the mask and feels the pressure is adequate.  Since going on CPAP she feels rested in the am and has no significant daytime sleepiness.  She denies any significant mouth or nasal dryness or nasal congestion.  She does not think that he snores.     Past Medical History:  Diagnosis Date  . Arthritis    Rheumatoid arthritis,   . Blood transfusion    1981  . Chronic diastolic CHF (congestive heart failure) (HCC)    diastolic   . Fibromyalgia   . Heart murmur    as a child  . Hiatal hernia    sjorgens syndrome  . OSA (obstructive sleep apnea)   . Peripheral autonomic neuropathy of unknown cause     Past Surgical History:  Procedure Laterality Date  . ABDOMINAL HYSTERECTOMY     BTL, BSO  . BREAST SURGERY     mass removal   . CHOLECYSTECTOMY    . DIAGNOSTIC  LAPAROSCOPY     x3  . KNEE ARTHROSCOPY     x2  . MASS EXCISION Left 03/22/2017   Procedure: EXCISION OF LEFT BREAST MASS;  Surgeon: Donnie Mesa, MD;  Location: [facility];  Service: General;  Laterality: Left;  . patotid cystectomy    . TENDON REPAIR  1980   left ankle and tibia  . TONSILLECTOMY    . TOTAL KNEE ARTHROPLASTY  06/21/2011   Procedure: TOTAL KNEE ARTHROPLASTY;  Surgeon: Gearlean Alf;  Location: [facility];  Service: Orthopedics;  Laterality: Right;  . TUBAL LIGATION      Current Medications: Current Meds  Medication Sig  . atorvastatin (LIPITOR) 20 MG tablet Take 1 tablet (20 mg total) by mouth daily.  . carbamazepine (TEGRETOL) 200 MG tablet 1/2 tablet in the morning and 1 tablet in the evening (Patient taking differently: Take 100-200 mg by mouth 2 (two) times daily. 1/2 tablet in the morning and 1 tablet in the evening)  . Cholecalciferol (VITAMIN D3) 5000 units TABS Take 5,000 Units by mouth daily.   . cycloSPORINE (RESTASIS) 0.05 % ophthalmic emulsion Place 2 drops into both eyes 2 (two) times daily.   . diphenoxylate-atropine (LOMOTIL) 2.5-0.025 MG per tablet Take 1 tablet by mouth 4 (four)  times daily as needed for diarrhea or loose stools.  . gabapentin (NEURONTIN) 300 MG capsule Take 600-900 mg by mouth 2 (two) times daily. Take 2 capsules in the morning and 3 capsules at bedtime   . HYDROcodone-acetaminophen (NORCO/VICODIN) 5-325 MG tablet Take 1 tablet by mouth at bedtime as needed.  . hydroxychloroquine (PLAQUENIL) 200 MG tablet Take 1 tablet by mouth 2 (two) times daily. Monday though Friday  . leflunomide (ARAVA) 20 MG tablet Take 20 mg by mouth daily.  [name] lidocaine (LIDODERM) 5 % Place 1 patch onto the skin daily. Remove & Discard patch within 12 hours or as directed by MD  . metoprolol succinate (TOPROL-XL) 25 MG 24 hr tablet TAKE 1 TABLET BY MOUTH DAILY. (Patient taking differently: TAKE 1 TABLET BY MOUTH DAILY AT NIGHT)  . Multiple Vitamin (MULTIVITAMIN WITH  MINERALS) TABS tablet Take 1 tablet by mouth daily.  . pantoprazole (PROTONIX) 40 MG tablet Take 40 mg by mouth daily before breakfast.   . potassium chloride 20 MEQ TBCR Take 40 mEq by mouth 3 (three) times daily.  . Probiotic Product ([place]) 170 MG CAPS Take 1 capsule by mouth every morning.  . promethazine (PHENERGAN) 25 MG tablet TAKE 1 TABLET BY MOUTH EVERY 6 HOURS AS NEEDED. (Patient taking differently: TAKE 1 TABLET BY MOUTH EVERY 6 HOURS AS NEEDED FOR NAUSEA/VOMITING)  . silver sulfADIAZINE (SILVADENE) 1 % cream Apply 1 application topically daily as needed (for skin infection/irritation.).   [name] spironolactone (ALDACTONE) 25 MG tablet Take 25 mg daily by mouth. Patient will increase to 25 mg BID on 06/28/17,if tolerating once a day.  [name] tiZANidine (ZANAFLEX) 4 MG tablet TAKE 1 TABLET BY MOUTH DAILY AT BEDTIME  . torsemide (DEMADEX) 20 MG tablet Take 2 tablets (40 mg total) by mouth 2 (two) times daily.  . Vitamin D, Ergocalciferol, (DRISDOL) 50000 units CAPS capsule Take 1 capsule (50,000 Units total) by mouth every 7 (seven) days.     Allergies:   Sulfa antibiotics; Cymbalta [duloxetine hcl]; Demerol; Ivp dye [iodinated diagnostic agents]; Morphine and related; and Adhesive [tape]   Social History   Socioeconomic History  . Marital status: Married    Spouse name: None  . Number of children: 2  . Years of education: AS  . Highest education level: None  Social Needs  . Financial resource strain: None  . Food insecurity - worry: None  . Food insecurity - inability: None  . Transportation needs - medical: None  . Transportation needs - non-medical: None  Occupational History  . Occupation: disability  . Occupation: Therapist, sports  Tobacco Use  . Smoking status: Never Smoker  . Smokeless tobacco: Never Used  Substance and Sexual Activity  . Alcohol use: Yes    Comment: less than 1 month  . Drug use: No  . Sexual activity: No  Other Topics Concern  . None  Social  History Narrative   Lives at home alone   Right-handed   Drinks 1 or less cups of coffee and 2 servings of either tea or soda per day     Family History: The patient's family history includes Alzheimer's disease in her mother; Cancer in her father; Glaucoma in her brother, brother, and mother; Heart attack in her brother and mother; Heart disease in her brother; Hyperlipidemia in her brother; Hypertension in her mother.  ROS:   Please see the history of present illness.    ROS  All other systems reviewed and negative.   EKGs/Labs/Other Studies  Reviewed:    The following studies were reviewed today: CPAP download  EKG:  EKG is not ordered today.    Recent Labs: 07/02/2017: Hemoglobin 13.4; Platelets 176; TSH 1.250 07/12/2017: BUN 21; Creatinine, Ser 1.00; Potassium 4.1; Sodium 139 08/15/2017: ALT 18   Recent Lipid Panel    Component Value Date/Time   CHOL 249 (H) 07/02/2017 1415   TRIG 304 (H) 07/02/2017 1415   HDL 50 07/02/2017 1415   CHOLHDL 5.0 (H) 07/02/2017 1415   CHOLHDL 4.7 08/11/2016 1017   VLDL 59 (H) 08/11/2016 1017   LDLCALC 138 (H) 07/02/2017 1415    Physical Exam:    VS:  BP (!) 141/83   Pulse 76   Ht 5\' 1"  (1.549 m)   Wt 235 lb 9.6 oz (106.9 kg)   BMI 44.52 kg/m     Wt Readings from Last 3 Encounters:  08/30/17 235 lb 9.6 oz (106.9 kg)  07/17/17 229 lb 14.4 oz (104.3 kg)  07/12/17 229 lb (103.9 kg)     GEN:  Well nourished, well developed in no acute distress HEENT: Normal NECK: No JVD; No carotid bruits LYMPHATICS: No lymphadenopathy CARDIAC: RRR, no murmurs, rubs, gallops RESPIRATORY:  Clear to auscultation without rales, wheezing or rhonchi  ABDOMEN: Soft, non-tender, non-distended MUSCULOSKELETAL:  No edema; No deformity  SKIN: Warm and dry NEUROLOGIC:  Alert and oriented x 3 PSYCHIATRIC:  Normal affect   ASSESSMENT:    1. Chronic diastolic CHF (congestive heart failure) ([place])   2. PVC's (premature ventricular contractions)   3. OSA  (obstructive sleep apnea)   4. Morbid obesity ([place])   5. Hypokalemia    PLAN:    In order of problems listed above:  1.  Chronic diastolic CHF - she appears mildly volume overloaded on exam.  Her weight is stable.  She thinks that the addition of metolazone in the past helped keep the fluid off but she had profound hypokalemia on it.  I have recommended that we increase demadex to 60mg  BID and continue aldactone.  I asked her to call me next week and let me know if she has lost the weight and edema improved.  If not then will add metolazone 2.5mg  twice weekly and refer to AHF service.   2.  PVC's - well suppressed on Toprol XL 25mg  daily  3.  OSA - the patient is tolerating PAP therapy well without any problems. The PAP download was reviewed today and showed an AHI of 2.5/hr on 12 cm H2O with 77% compliance in using more than 4 hours nightly.  The patient has been using and benefiting from PAP use and will continue to benefit from therapy.   4.  Morbid obesity - I have encouraged her to get into a routine exercise program and cut back on carbs and portions.   5 .  Hypokalemia - secondary to chronic diuretic use.  She will continue on Aldactone and demadex.  Her last potassium in November was 4.1.  She is followed by [facility].  I will repeat a BMET today.     Medication Adjustments/Labs and Tests Ordered: Current medicines are reviewed at length with the patient today.  Concerns regarding medicines are outlined above.  No orders of the defined types were placed in this encounter.  No orders of the defined types were placed in this encounter.   Signed, Fransico Him, MD  08/30/2017 2:02 PM    [place]

## 2017-08-30 ENCOUNTER — Ambulatory Visit: Payer: PPO | Admitting: Cardiology

## 2017-08-30 ENCOUNTER — Encounter: Payer: Self-pay | Admitting: Cardiology

## 2017-08-30 VITALS — BP 141/83 | HR 76 | Ht 61.0 in | Wt 235.6 lb

## 2017-08-30 DIAGNOSIS — G4733 Obstructive sleep apnea (adult) (pediatric): Secondary | ICD-10-CM | POA: Diagnosis not present

## 2017-08-30 DIAGNOSIS — E876 Hypokalemia: Secondary | ICD-10-CM

## 2017-08-30 DIAGNOSIS — I493 Ventricular premature depolarization: Secondary | ICD-10-CM

## 2017-08-30 DIAGNOSIS — I5032 Chronic diastolic (congestive) heart failure: Secondary | ICD-10-CM

## 2017-08-30 MED ORDER — TORSEMIDE 20 MG PO TABS: 60.0000 mg | ORAL_TABLET | Freq: Two times a day (BID) | ORAL | 3 refills | Status: DC

## 2017-08-30 MED ORDER — POTASSIUM CHLORIDE ER 20 MEQ PO TBCR: 40.0000 meq | EXTENDED_RELEASE_TABLET | Freq: Three times a day (TID) | ORAL | 11 refills | Status: DC

## 2017-08-30 NOTE — Patient Instructions (Signed)
Medication Instructions:  Your physician has recommended you make the following change in your medication:   INCREASE: demadex to 60 mg (3 tablets) two times a day    Labwork: Today for kidney function test  Your physician recommends that you return for lab work in 1 week for repeat BMET(kidney function)   Testing/Procedures: None ordered   Follow-Up: Your physician recommends that you schedule a follow-up appointment in 2 weeks with APP.   Your physician wants you to follow-up in: 6 months with Dr. Radford Pax. You will receive a reminder letter in the mail two months in advance. If you don't receive a letter, please call our office to schedule the follow-up appointment.  Any Other Special Instructions Will Be Listed Below (If Applicable).     If you need a refill on your cardiac medications before your next appointment, please call your pharmacy.

## 2017-08-31 LAB — BASIC METABOLIC PANEL
BUN / CREAT RATIO: 19 (ref 12–28)
BUN: 20 mg/dL (ref 8–27)
CHLORIDE: 94 mmol/L — AB (ref 96–106)
CO2: 27 mmol/L (ref 20–29)
Calcium: 9.7 mg/dL (ref 8.7–10.3)
Creatinine, Ser: 1.07 mg/dL — ABNORMAL HIGH (ref 0.57–1.00)
GFR calc non Af Amer: 55 mL/min/{1.73_m2} — ABNORMAL LOW (ref >59)
GFR, EST AFRICAN AMERICAN: 63 mL/min/{1.73_m2} (ref >59)
Glucose: 109 mg/dL — ABNORMAL HIGH (ref 65–99)
POTASSIUM: 3.5 mmol/L (ref 3.5–5.2)
Sodium: 142 mmol/L (ref 134–144)

## 2017-09-03 ENCOUNTER — Other Ambulatory Visit: Payer: PPO

## 2017-09-04 ENCOUNTER — Other Ambulatory Visit: Payer: PPO

## 2017-09-05 ENCOUNTER — Telehealth: Payer: Self-pay

## 2017-09-05 MED ORDER — POTASSIUM CHLORIDE ER 10 MEQ PO TBCR: EXTENDED_RELEASE_TABLET | ORAL | 3 refills | Status: DC

## 2017-09-05 NOTE — Telephone Encounter (Signed)
Returned call to patient. Per Dr. Theodosia Blender recommendation, Instructed patient to increase kdur to 60 mEq in the morning and 40 mEq lunch and evening.   Patient states she has a follow up appt with [place], [place] on 09/13/17 and needs to know if she should keep that appt or follow up at the HF clinic per Dr. Radford Pax. Patient also wanted to know if she needs to start metolazone 2.5 mg twice a week with the demadex. Informed patient I would send to Dr. Radford Pax for further recommendations. Patient verbalized understanding and thanked me for the call.

## 2017-09-05 NOTE — Telephone Encounter (Signed)
Informed patient that Dr. Radford Pax would rather patient be seen in HF clinic, and informed patient its okay to restart Metolazone. Patient stated she took that with lasix and she will wait till she is seen in the HF clinic before starting. Patient in agreement with plan and thanked me for the call.

## 2017-09-05 NOTE — Telephone Encounter (Signed)
I would like her to see AHF clinic instead of [facility] in the next week.  OK to to restart Metolazone.

## 2017-09-06 ENCOUNTER — Other Ambulatory Visit: Payer: PPO

## 2017-09-06 ENCOUNTER — Ambulatory Visit: Payer: PPO | Admitting: Rheumatology

## 2017-09-06 ENCOUNTER — Telehealth: Payer: Self-pay

## 2017-09-06 ENCOUNTER — Encounter: Payer: Self-pay | Admitting: Rheumatology

## 2017-09-06 VITALS — BP 122/64 | HR 78 | Resp 17 | Ht 62.0 in | Wt 240.0 lb

## 2017-09-06 DIAGNOSIS — K200 Eosinophilic esophagitis: Secondary | ICD-10-CM

## 2017-09-06 DIAGNOSIS — M3501 Sicca syndrome with keratoconjunctivitis: Secondary | ICD-10-CM | POA: Diagnosis not present

## 2017-09-06 DIAGNOSIS — M797 Fibromyalgia: Secondary | ICD-10-CM | POA: Diagnosis not present

## 2017-09-06 DIAGNOSIS — M810 Age-related osteoporosis without current pathological fracture: Secondary | ICD-10-CM | POA: Diagnosis not present

## 2017-09-06 DIAGNOSIS — Z8719 Personal history of other diseases of the digestive system: Secondary | ICD-10-CM

## 2017-09-06 DIAGNOSIS — Z8679 Personal history of other diseases of the circulatory system: Secondary | ICD-10-CM

## 2017-09-06 DIAGNOSIS — Z79899 Other long term (current) drug therapy: Secondary | ICD-10-CM | POA: Diagnosis not present

## 2017-09-06 DIAGNOSIS — M1712 Unilateral primary osteoarthritis, left knee: Secondary | ICD-10-CM | POA: Diagnosis not present

## 2017-09-06 DIAGNOSIS — Z8669 Personal history of other diseases of the nervous system and sense organs: Secondary | ICD-10-CM

## 2017-09-06 DIAGNOSIS — Z96651 Presence of right artificial knee joint: Secondary | ICD-10-CM | POA: Diagnosis not present

## 2017-09-06 DIAGNOSIS — Z8639 Personal history of other endocrine, nutritional and metabolic disease: Secondary | ICD-10-CM | POA: Diagnosis not present

## 2017-09-06 DIAGNOSIS — M5136 Other intervertebral disc degeneration, lumbar region: Secondary | ICD-10-CM | POA: Diagnosis not present

## 2017-09-06 DIAGNOSIS — I7300 Raynaud's syndrome without gangrene: Secondary | ICD-10-CM | POA: Diagnosis not present

## 2017-09-06 DIAGNOSIS — Z8659 Personal history of other mental and behavioral disorders: Secondary | ICD-10-CM | POA: Diagnosis not present

## 2017-09-06 DIAGNOSIS — M0579 Rheumatoid arthritis with rheumatoid factor of multiple sites without organ or systems involvement: Secondary | ICD-10-CM

## 2017-09-06 MED ORDER — HYDROCODONE-ACETAMINOPHEN 5-325 MG PO TABS: 1.0000 | ORAL_TABLET | Freq: Every evening | ORAL | 0 refills | Status: DC | PRN

## 2017-09-06 NOTE — Patient Instructions (Signed)
Standing Labs We placed an order today for your standing lab work.    Please come back and get your standing labs in May and every 3 months  We have open lab Monday through Friday from 8:30-11:30 AM and 1:30-4 PM at the office of Dr. Bo Merino.   The office is located at [address], [place], [place], [place] [address] No appointment is necessary.   Labs are drawn by [facility].  You may receive a bill from [facility] for your lab work. If you have any questions regarding directions or hours of operation,  please call [phone].

## 2017-09-06 NOTE — Telephone Encounter (Signed)
Informed patient that [facility] prescription will be at the front desk for pick up when she comes in for labs today. Patient in agreement with plan and thanked me for the call.

## 2017-09-07 LAB — COMPLETE METABOLIC PANEL WITH GFR
AG Ratio: 1.7 (calc) (ref 1.0–2.5)
ALKALINE PHOSPHATASE (APISO): 110 U/L (ref 33–130)
ALT: 17 U/L (ref 6–29)
AST: 23 U/L (ref 10–35)
Albumin: 4.3 g/dL (ref 3.6–5.1)
BILIRUBIN TOTAL: 0.3 mg/dL (ref 0.2–1.2)
BUN: 21 mg/dL (ref 7–25)
CHLORIDE: 102 mmol/L (ref 98–110)
CO2: 29 mmol/L (ref 20–32)
CREATININE: 0.9 mg/dL (ref 0.50–0.99)
Calcium: 9.8 mg/dL (ref 8.6–10.4)
GFR, Est African American: 78 mL/min/{1.73_m2} (ref >=60)
GFR, Est Non African American: 67 mL/min/{1.73_m2} (ref >=60)
GLOBULIN: 2.5 g/dL (ref 1.9–3.7)
GLUCOSE: 89 mg/dL (ref 65–99)
POTASSIUM: 4.1 mmol/L (ref 3.5–5.3)
SODIUM: 141 mmol/L (ref 135–146)
Total Protein: 6.8 g/dL (ref 6.1–8.1)

## 2017-09-07 LAB — PAIN MGMT, PROFILE 5 W/CONF, U
Amphetamines: NEGATIVE ng/mL (ref <500)
Barbiturates: NEGATIVE ng/mL (ref <300)
Benzodiazepines: NEGATIVE ng/mL (ref <100)
CREATININE: 49.7 mg/dL
Cocaine Metabolite: NEGATIVE ng/mL (ref <150)
MARIJUANA METABOLITE: NEGATIVE ng/mL (ref <20)
Methadone Metabolite: NEGATIVE ng/mL (ref <100)
OPIATES: NEGATIVE ng/mL (ref <100)
OXIDANT: NEGATIVE ug/mL (ref <200)
OXYCODONE: NEGATIVE ng/mL (ref <100)
PH: 6.14 (ref 4.5–9.0)

## 2017-09-07 LAB — CBC WITH DIFFERENTIAL/PLATELET
BASOS PCT: 0.8 %
Basophils Absolute: 63 cells/uL (ref 0–200)
EOS ABS: 71 {cells}/uL (ref 15–500)
Eosinophils Relative: 0.9 %
HCT: 40.1 % (ref 35.0–45.0)
HEMOGLOBIN: 13.7 g/dL (ref 11.7–15.5)
Lymphs Abs: 1699 cells/uL (ref 850–3900)
MCH: 29.6 pg (ref 27.0–33.0)
MCHC: 34.2 g/dL (ref 32.0–36.0)
MCV: 86.6 fL (ref 80.0–100.0)
MONOS PCT: 10.4 %
MPV: 11.4 fL (ref 7.5–12.5)
NEUTROS ABS: 5246 {cells}/uL (ref 1500–7800)
Neutrophils Relative %: 66.4 %
PLATELETS: 213 10*3/uL (ref 140–400)
RBC: 4.63 10*6/uL (ref 3.80–5.10)
RDW: 13.2 % (ref 11.0–15.0)
Total Lymphocyte: 21.5 %
WBC: 7.9 10*3/uL (ref 3.8–10.8)
WBCMIX: 822 {cells}/uL (ref 200–950)

## 2017-09-07 NOTE — Progress Notes (Signed)
WNL

## 2017-09-10 ENCOUNTER — Other Ambulatory Visit: Payer: Self-pay | Admitting: Rheumatology

## 2017-09-10 ENCOUNTER — Encounter (HOSPITAL_COMMUNITY): Payer: PPO

## 2017-09-11 NOTE — Telephone Encounter (Addendum)
Last Visit: 09/06/17 Next Visit: 02/05/18 Labs: 09/06/17 WNL PLQ Eye Exam: 06/20/16 WNL  Patient has an appointment scheduled for 10/16/17 as it was the soonest appointment she could get.   Okay to refill per Dr. Estanislado Pandy

## 2017-09-12 ENCOUNTER — Encounter (HOSPITAL_COMMUNITY): Payer: PPO

## 2017-09-12 ENCOUNTER — Ambulatory Visit (HOSPITAL_COMMUNITY)
Admission: RE | Admit: 2017-09-12 | Discharge: 2017-09-12 | Disposition: A | Discharge status: Home / Self Care | Payer: PPO | Source: Ambulatory Visit | Attending: Cardiology | Admitting: Cardiology

## 2017-09-12 VITALS — BP 138/78 | HR 89 | Wt 235.5 lb

## 2017-09-12 DIAGNOSIS — Z8249 Family history of ischemic heart disease and other diseases of the circulatory system: Secondary | ICD-10-CM | POA: Diagnosis not present

## 2017-09-12 DIAGNOSIS — M069 Rheumatoid arthritis, unspecified: Secondary | ICD-10-CM | POA: Insufficient documentation

## 2017-09-12 DIAGNOSIS — I110 Hypertensive heart disease with heart failure: Secondary | ICD-10-CM | POA: Diagnosis not present

## 2017-09-12 DIAGNOSIS — Z79891 Long term (current) use of opiate analgesic: Secondary | ICD-10-CM | POA: Insufficient documentation

## 2017-09-12 DIAGNOSIS — Z8489 Family history of other specified conditions: Secondary | ICD-10-CM | POA: Insufficient documentation

## 2017-09-12 DIAGNOSIS — G4733 Obstructive sleep apnea (adult) (pediatric): Secondary | ICD-10-CM | POA: Diagnosis not present

## 2017-09-12 DIAGNOSIS — L03031 Cellulitis of right toe: Secondary | ICD-10-CM

## 2017-09-12 DIAGNOSIS — M797 Fibromyalgia: Secondary | ICD-10-CM | POA: Diagnosis not present

## 2017-09-12 DIAGNOSIS — M7989 Other specified soft tissue disorders: Secondary | ICD-10-CM | POA: Diagnosis not present

## 2017-09-12 DIAGNOSIS — Z809 Family history of malignant neoplasm, unspecified: Secondary | ICD-10-CM | POA: Insufficient documentation

## 2017-09-12 DIAGNOSIS — Z6841 Body Mass Index (BMI) 40.0 and over, adult: Secondary | ICD-10-CM | POA: Insufficient documentation

## 2017-09-12 DIAGNOSIS — L03115 Cellulitis of right lower limb: Secondary | ICD-10-CM | POA: Insufficient documentation

## 2017-09-12 DIAGNOSIS — I5032 Chronic diastolic (congestive) heart failure: Secondary | ICD-10-CM | POA: Diagnosis not present

## 2017-09-12 DIAGNOSIS — Z79899 Other long term (current) drug therapy: Secondary | ICD-10-CM | POA: Diagnosis not present

## 2017-09-12 DIAGNOSIS — K449 Diaphragmatic hernia without obstruction or gangrene: Secondary | ICD-10-CM | POA: Diagnosis not present

## 2017-09-12 DIAGNOSIS — I493 Ventricular premature depolarization: Secondary | ICD-10-CM | POA: Insufficient documentation

## 2017-09-12 MED ORDER — CEPHALEXIN 500 MG PO CAPS: 500.0000 mg | ORAL_CAPSULE | Freq: Three times a day (TID) | ORAL | 0 refills | Status: DC

## 2017-09-12 NOTE — Patient Instructions (Signed)
START Keflex 500 mg tablet three times daily for five days.  Will schedule you for lower extremity ultrasound at [facility]. Address: [address], [place], [place] [address]  Phone: [phone]  Return next week for labs.  Follow up 2-3 weeks with Dr. Haroldine Laws.  Take all medication as prescribed the day of your appointment. Bring all medications with you to your appointment.  Do the following things EVERYDAY: 1) Weigh yourself in the morning before breakfast. Write it down and keep it in a log. 2) Take your medicines as prescribed 3) Eat low salt foods-Limit salt (sodium) to 2000 mg per day.  4) Stay as active as you can everyday 5) Limit all fluids for the day to less than 2 liters

## 2017-09-12 NOTE — Progress Notes (Signed)
ADVANCED HF CLINIC CONSULT NOTE   PCP: Primary Cardiologist: Dr Radford Pax  HPI: [name] was referred to the HF clinic by Dr Radford Pax for diastolic heart failure   [name] is a 67 year old with a history of PVCs, morbid obesity, OSA on CPAP, RA, and chronic diastolic heart failure.   Says she started having problems with volume overload in July 2018. She has been followed by Dr Radford Pax. Has tried multiple diuretics but says nothing has worked very well except lasix and metolazone but that bottoms out her potassium. Last seen on 08/1717. At that time torsemide was increased to 60 mg twice a day due to volume overload. Weight at that time was 235 pounds.   Denies h/o high BP. Says BP has always been on low side. Drinks a lot of water every day. Denies CP. Can do ADLs but is limited by RA and neuropathy in legs. Neuropathy is up to her knees feels it is stable with neurontin and tegretol. Denies orthopnea or PND. Feels like her abdomen is bloated.   Weighs herself every day. Weight 233-240. Says she is peeing a lot on torsemide but weight still up. Says she tries to eat low sodium but doesn't know how much sodium is really too much.     ECHO 02/2017  - Left ventricle: The cavity size was normal. Wall thickness was   increased in a pattern of mild to moderate LVH. Systolic function was   normal. The estimated ejection fraction was in the range of 60%   to 65%. Wall motion was normal; there were no regional wall   motion abnormalities. Doppler parameters are consistent with   abnormal left ventricular relaxation (grade 1 diastolic   dysfunction).  ROS: All systems negative except as listed in HPI, PMH and Problem List.  SH:  Social History   Socioeconomic History  . Marital status: Married    Spouse name: Not on file  . Number of children: 2  . Years of education: AS  . Highest education level: Not on file  Social Needs  . Financial resource strain: Not on file  . Food insecurity -  worry: Not on file  . Food insecurity - inability: Not on file  . Transportation needs - medical: Not on file  . Transportation needs - non-medical: Not on file  Occupational History  . Occupation: disability  . Occupation: Therapist, sports  Tobacco Use  . Smoking status: Never Smoker  . Smokeless tobacco: Never Used  Substance and Sexual Activity  . Alcohol use: Yes    Comment: socially   . Drug use: No  . Sexual activity: No  Other Topics Concern  . Not on file  Social History Narrative   Lives at home alone   Right-handed   Drinks 1 or less cups of coffee and 2 servings of either tea or soda per day    FH:  Family History  Problem Relation Age of Onset  . Alzheimer's disease Mother   . Heart attack Mother   . Hypertension Mother   . Glaucoma Mother   . Cancer Father   . Heart attack Brother   . Heart disease Brother   . Glaucoma Brother   . Hyperlipidemia Brother   . Glaucoma Brother     Past Medical History:  Diagnosis Date  . Arthritis    Rheumatoid arthritis,   . Blood transfusion    1981  . Chronic diastolic CHF (congestive heart failure) ([facility])  diastolic   . Fibromyalgia   . Heart murmur    as a child  . Hiatal hernia    sjorgens syndrome  . OSA (obstructive sleep apnea)   . Peripheral autonomic neuropathy of unknown cause     Current Outpatient Medications  Medication Sig Dispense Refill  . atorvastatin (LIPITOR) 20 MG tablet Take 1 tablet (20 mg total) by mouth daily. 90 tablet 3  . cycloSPORINE (RESTASIS) 0.05 % ophthalmic emulsion Place 2 drops into both eyes 2 (two) times daily.     . diphenoxylate-atropine (LOMOTIL) 2.5-0.025 MG per tablet Take 1 tablet by mouth 4 (four) times daily as needed for diarrhea or loose stools.    . gabapentin (NEURONTIN) 300 MG capsule Take 600-900 mg by mouth 2 (two) times daily. Take 2 capsules in the morning and 3 capsules at bedtime     . HYDROcodone-acetaminophen (NORCO/VICODIN) 5-325 MG tablet Take 1 tablet by mouth at  bedtime as needed. 30 tablet 0  . hydroxychloroquine (PLAQUENIL) 200 MG tablet Take 1 tablet by mouth 2 (two) times daily. Monday though Friday  0  . leflunomide (ARAVA) 20 MG tablet Take 20 mg by mouth daily.    [name] lidocaine (LIDODERM) 5 % Place 1 patch onto the skin daily. Remove & Discard patch within 12 hours or as directed by MD 30 patch 0  . metoprolol succinate (TOPROL-XL) 25 MG 24 hr tablet TAKE 1 TABLET BY MOUTH DAILY. (Patient taking differently: TAKE 1 TABLET BY MOUTH DAILY AT NIGHT) 90 tablet 2  . Multiple Vitamin (MULTIVITAMIN WITH MINERALS) TABS tablet Take 1 tablet by mouth daily.    . pantoprazole (PROTONIX) 40 MG tablet Take 40 mg by mouth daily before breakfast.     . potassium chloride (K-DUR) 10 MEQ tablet Take 6 tablets (60 mEq total) by mouth every morning AND 4 tablets (40 mEq total) daily with lunch AND 4 tablets (40 mEq total) every evening. 1260 tablet 3  . Probiotic Product ([place]) 170 MG CAPS Take 1 capsule by mouth every morning.    . promethazine (PHENERGAN) 25 MG tablet TAKE 1 TABLET BY MOUTH EVERY 6 HOURS AS NEEDED. (Patient taking differently: TAKE 1 TABLET BY MOUTH EVERY 6 HOURS AS NEEDED FOR NAUSEA/VOMITING) 120 tablet 2  . silver sulfADIAZINE (SILVADENE) 1 % cream Apply 1 application topically daily as needed (for skin infection/irritation.).     [name] spironolactone (ALDACTONE) 25 MG tablet Take 25 mg daily by mouth. Patient will increase to 25 mg BID on 06/28/17,if tolerating once a day.    [name] tiZANidine (ZANAFLEX) 4 MG tablet TAKE 1 TABLET BY MOUTH DAILY AT BEDTIME 90 tablet 1  . torsemide (DEMADEX) 20 MG tablet Take 3 tablets (60 mg total) by mouth 2 (two) times daily. 540 tablet 3  . Vitamin D, Ergocalciferol, (DRISDOL) 50000 units CAPS capsule Take 1 capsule (50,000 Units total) by mouth every 7 (seven) days. 12 capsule 10   No current facility-administered medications for this encounter.     Vitals:   09/12/17 1436  BP: 138/78  Pulse: 89   SpO2: 96%  Weight: 235 lb 8 oz (106.8 kg)    PHYSICAL EXAM:  General:  Obese woman. Walked slowly into clinic. NAD. No resp difficulty HEENT: normal Neck: supple. JVP 5-6. Carotids 2+ bilaterally; no bruits. No lymphadenopathy or thryomegaly appreciated. Cor: PMI normal. Regular rate & rhythm. No rubs, gallops or murmurs. Lungs: clear Abdomen: markedly obese soft, nontender, nondistended. No hepatosplenomegaly. No bruits or masses. Good  bowel sounds. Extremities: no cyanosis, clubbing, rash. S/p R TKA  2-3+ edema RLE with overlying erythema. LLE norma  Neuro: alert & orientedx3, cranial nerves grossly intact. Moves all 4 extremities w/o difficulty. Affect pleasant.   ECG: NSR 76. LVH No ST-T wave abnormalities. Normal volts. Personally reviewed   ASSESSMENT & PLAN:  1. Chronic Diastolic Heart Failure  - Echo 7/18 reviewed personally. EF normal with mild to moderate LVH. Grade 1 DD. RV normal - Overall volume status looks quite good today although she feels she is bloated. She does drink a lot of fluid and I cautioned her to try to cut this back - On exam with asymmetric LE R>>L. May be due to venous insufficiency after previous TKA but need to exclude DVT. Also has overlying cellulitis. Check u/s and start keflex. Will need compression hose - Will repeat echo at next visit and we discussed possibility of [place] if she want definitive evaluation of her filling pressures - Cautioned against overdiuresis. Check BMET next week - With neuropathy will need to keep in mind possibility of TTR amyloid but suspicion is low at this point.   2. PVCs - per Dr. Radford Pax  3. Right lower extremity edema and cellulitis - as above check u/s and start Keflex   3. OSA -Continue CPAP nightly  4. Obesity Body mass index is 43.07 kg/m.  - I suspect this is a major part of her symptoms currently - Encouraged weight loss.    5. HTN - denies h/o HTN. However BP moderately elevated here. Will follow.    Glori Bickers, MD  3:31 PM

## 2017-09-13 ENCOUNTER — Ambulatory Visit: Payer: PPO | Admitting: Cardiology

## 2017-09-14 ENCOUNTER — Ambulatory Visit (HOSPITAL_COMMUNITY)
Admission: RE | Admit: 2017-09-14 | Discharge: 2017-09-14 | Disposition: A | Discharge status: Home / Self Care | Payer: PPO | Source: Ambulatory Visit | Attending: Cardiology | Admitting: Cardiology

## 2017-09-14 DIAGNOSIS — M7989 Other specified soft tissue disorders: Secondary | ICD-10-CM | POA: Insufficient documentation

## 2017-09-19 ENCOUNTER — Other Ambulatory Visit (HOSPITAL_COMMUNITY): Payer: PPO

## 2017-09-20 ENCOUNTER — Ambulatory Visit (HOSPITAL_COMMUNITY)
Admission: RE | Admit: 2017-09-20 | Discharge: 2017-09-20 | Disposition: A | Discharge status: Home / Self Care | Payer: PPO | Source: Ambulatory Visit | Attending: Internal Medicine | Admitting: Internal Medicine

## 2017-09-20 DIAGNOSIS — E876 Hypokalemia: Secondary | ICD-10-CM | POA: Diagnosis not present

## 2017-09-20 DIAGNOSIS — I5032 Chronic diastolic (congestive) heart failure: Secondary | ICD-10-CM | POA: Insufficient documentation

## 2017-09-20 DIAGNOSIS — L03119 Cellulitis of unspecified part of limb: Secondary | ICD-10-CM | POA: Diagnosis not present

## 2017-09-20 LAB — BASIC METABOLIC PANEL
ANION GAP: 11 (ref 5–15)
BUN: 14 mg/dL (ref 6–20)
CHLORIDE: 102 mmol/L (ref 101–111)
CO2: 25 mmol/L (ref 22–32)
Calcium: 9.3 mg/dL (ref 8.9–10.3)
Creatinine, Ser: 0.82 mg/dL (ref 0.44–1.00)
GFR calc Af Amer: >60 mL/min (ref >60)
Glucose, Bld: 107 mg/dL — ABNORMAL HIGH (ref 65–99)
POTASSIUM: 3.9 mmol/L (ref 3.5–5.1)
SODIUM: 138 mmol/L (ref 135–145)

## 2017-09-24 DIAGNOSIS — Z85828 Personal history of other malignant neoplasm of skin: Secondary | ICD-10-CM | POA: Diagnosis not present

## 2017-09-24 DIAGNOSIS — L820 Inflamed seborrheic keratosis: Secondary | ICD-10-CM | POA: Diagnosis not present

## 2017-09-24 DIAGNOSIS — L570 Actinic keratosis: Secondary | ICD-10-CM | POA: Diagnosis not present

## 2017-09-24 DIAGNOSIS — D1801 Hemangioma of skin and subcutaneous tissue: Secondary | ICD-10-CM | POA: Diagnosis not present

## 2017-09-26 ENCOUNTER — Emergency Department (HOSPITAL_COMMUNITY)
Admission: EM | Admit: 2017-09-26 | Discharge: 2017-09-27 | Disposition: A | Discharge status: Home / Self Care | Payer: PPO | Attending: Emergency Medicine | Admitting: Emergency Medicine

## 2017-09-26 ENCOUNTER — Other Ambulatory Visit: Payer: Self-pay

## 2017-09-26 ENCOUNTER — Encounter (HOSPITAL_COMMUNITY): Payer: Self-pay

## 2017-09-26 ENCOUNTER — Emergency Department (HOSPITAL_COMMUNITY): Payer: PPO

## 2017-09-26 DIAGNOSIS — Z79899 Other long term (current) drug therapy: Secondary | ICD-10-CM | POA: Diagnosis not present

## 2017-09-26 DIAGNOSIS — I5032 Chronic diastolic (congestive) heart failure: Secondary | ICD-10-CM | POA: Insufficient documentation

## 2017-09-26 DIAGNOSIS — Z7982 Long term (current) use of aspirin: Secondary | ICD-10-CM | POA: Diagnosis not present

## 2017-09-26 DIAGNOSIS — Z96651 Presence of right artificial knee joint: Secondary | ICD-10-CM | POA: Diagnosis not present

## 2017-09-26 DIAGNOSIS — R091 Pleurisy: Secondary | ICD-10-CM | POA: Diagnosis not present

## 2017-09-26 DIAGNOSIS — R079 Chest pain, unspecified: Secondary | ICD-10-CM | POA: Diagnosis not present

## 2017-09-26 DIAGNOSIS — I700 Atherosclerosis of aorta: Secondary | ICD-10-CM | POA: Diagnosis not present

## 2017-09-26 DIAGNOSIS — R05 Cough: Secondary | ICD-10-CM | POA: Insufficient documentation

## 2017-09-26 LAB — CBC
HEMATOCRIT: 43.5 % (ref 36.0–46.0)
HEMOGLOBIN: 14.5 g/dL (ref 12.0–15.0)
MCH: 30.1 pg (ref 26.0–34.0)
MCHC: 33.3 g/dL (ref 30.0–36.0)
MCV: 90.2 fL (ref 78.0–100.0)
Platelets: 198 10*3/uL (ref 150–400)
RBC: 4.82 MIL/uL (ref 3.87–5.11)
RDW: 13.4 % (ref 11.5–15.5)
WBC: 11 10*3/uL — ABNORMAL HIGH (ref 4.0–10.5)

## 2017-09-26 LAB — BASIC METABOLIC PANEL
ANION GAP: 15 (ref 5–15)
BUN: 17 mg/dL (ref 6–20)
CALCIUM: 9.1 mg/dL (ref 8.9–10.3)
CO2: 24 mmol/L (ref 22–32)
Chloride: 101 mmol/L (ref 101–111)
Creatinine, Ser: 1.14 mg/dL — ABNORMAL HIGH (ref 0.44–1.00)
GFR, EST AFRICAN AMERICAN: 57 mL/min — AB (ref >60)
GFR, EST NON AFRICAN AMERICAN: 49 mL/min — AB (ref >60)
Glucose, Bld: 119 mg/dL — ABNORMAL HIGH (ref 65–99)
POTASSIUM: 4.1 mmol/L (ref 3.5–5.1)
Sodium: 140 mmol/L (ref 135–145)

## 2017-09-26 LAB — BRAIN NATRIURETIC PEPTIDE: B NATRIURETIC PEPTIDE 5: 44.4 pg/mL (ref 0.0–100.0)

## 2017-09-26 LAB — I-STAT TROPONIN, ED
TROPONIN I, POC: 0 ng/mL (ref 0.00–0.08)
TROPONIN I, POC: 0 ng/mL (ref 0.00–0.08)

## 2017-09-26 MED ORDER — HYDROCORTISONE NA SUCCINATE PF 250 MG IJ SOLR
200.0000 mg | Freq: Once | INTRAMUSCULAR | Status: AC
Start: 2017-09-26 — End: 2017-09-26
  Administered 2017-09-26: 200 mg via INTRAVENOUS
  Filled 2017-09-26: qty 200

## 2017-09-26 MED ORDER — IOPAMIDOL (ISOVUE-370) INJECTION 76%
INTRAVENOUS | Status: AC
  Administered 2017-09-26: 100 mL
  Filled 2017-09-26: qty 100

## 2017-09-26 MED ORDER — FENTANYL CITRATE (PF) 100 MCG/2ML IJ SOLN
50.0000 ug | Freq: Once | INTRAMUSCULAR | Status: AC
  Administered 2017-09-26: 50 ug via INTRAVENOUS
  Filled 2017-09-26: qty 2

## 2017-09-26 MED ORDER — DIPHENHYDRAMINE HCL 50 MG/ML IJ SOLN
50.0000 mg | Freq: Once | INTRAMUSCULAR | Status: AC
  Filled 2017-09-26: qty 1

## 2017-09-26 MED ORDER — DIPHENHYDRAMINE HCL 25 MG PO CAPS
50.0000 mg | ORAL_CAPSULE | Freq: Once | ORAL | Status: AC
  Administered 2017-09-26: 50 mg via ORAL
  Filled 2017-09-26: qty 2

## 2017-09-26 NOTE — ED Provider Notes (Signed)
  Patient placed in Quick Look pathway, seen and evaluated for chief complaint of chest pain since 0200 this morning and some slight shortness of breath. She reports history of CHF. Most recent echo with EF of 55-60%.  Pertinent H&P findings include speech is clear and coherent, no increased work of breathing, non-toxic apperaing, not diaphoretic.   Based on initial evaluation, labs are indicated and radiology studies are indicated.  Patient counseled on process, plan, and necessity for staying for completing the evaluation.   Vitals:   09/26/17 1321 09/26/17 1322  BP:  124/69  Pulse:  84  Resp:  16  Temp: 98.9 F (37.2 C)   SpO2:  97%      Waynetta Pean, PA-C 09/26/17 1328    Lajean Saver, MD 09/26/17 1440

## 2017-09-26 NOTE — ED Triage Notes (Signed)
Per Pt, Pt is coming from home with complaints of CP that started at 0200 this morning. Mid-center chest pain that is radiating to the middle of the back. Denies any N/V. Some SOB noted.

## 2017-09-26 NOTE — ED Notes (Signed)
Pharmacist notified to send pt.'s Solucortef.

## 2017-09-26 NOTE — ED Provider Notes (Signed)
[facility] Provider Note   CSN: [id number] Arrival date & time: 09/26/17  1305     History   Chief Complaint Chief Complaint  Patient presents with  . Chest Pain    HPI [name] is a 67 y.o. female.  HPI Pt started having chest pain around 2am.  THe pain was very intense and severe.  The pain radiated the shoulder blades.  The pain waxes and wanes.  Certain movements and deep breathing increase the pain.   SHe has been coughing.  Dry cough. No congestion.  NO hx of heart disease. Past Medical History:  Diagnosis Date  . Arthritis    Rheumatoid arthritis,   . Blood transfusion    1981  . Chronic diastolic CHF (congestive heart failure) ([facility])    diastolic   . Fibromyalgia   . Heart murmur    as a child  . Hiatal hernia    sjorgens syndrome  . OSA (obstructive sleep apnea)   . Peripheral autonomic neuropathy of unknown cause     Patient Active Problem List   Diagnosis Date Noted  . Family history of coronary arteriosclerosis- strong fam h/o CAD and early CAD.  07/17/2017  . Raynaud's disease without gangrene 03/12/2017  . Abnormal weight gain 02/06/2017  . Shingles outbreak 02/06/2017  . Cyst (solitary) of breast, left 01/31/2017  . Eosinophilic esophagitis 53/61/4431  . History of hyperlipidemia 10/04/2016  . 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 History of diabetes mellitus 09/09/2016  . History of diverticulitis 09/03/2016  . Osteoporosis 09/03/2016  . Autoimmune disease ([place]) 09/02/2016  . High risk medication use 09/02/2016  . History of esophagitis 09/02/2016  . Elevated triglycerides with high cholesterol 08/23/2016  . Low serum HDL 08/23/2016  . Breast cyst, left 06/14/2016  . Encounter for wellness examination 05/23/2016  . Abnormality of gait 05/09/2016  . Hypokalemia 03/22/2016  . 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 On potassium wasting diuretic therapy 02/29/2016  . GERD (gastroesophageal  reflux disease) 01/19/2016  . Glucose intolerance (impaired glucose tolerance) 01/19/2016  . Chronic diastolic CHF (congestive heart failure) ([facility]) 04/20/2015  . Peripheral polyneuropathy 02/02/2014  . Morbid obesity ([facility]) 11/17/2013  . Heart murmur   . OSA (obstructive sleep apnea)   . Rheumatoid arthritis ([place])   . Hiatal hernia   . Fibromyalgia   . PVC's (premature ventricular contractions)   . 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 Unilateral primary osteoarthritis, left knee 06/22/2011    Past Surgical History:  Procedure Laterality Date  . ABDOMINAL HYSTERECTOMY     BTL, BSO  . BREAST SURGERY     mass removal   . CHOLECYSTECTOMY    . DIAGNOSTIC LAPAROSCOPY     x3  . KNEE ARTHROSCOPY     x2  . MASS EXCISION Left 03/22/2017   Procedure: EXCISION OF LEFT BREAST MASS;  Surgeon: Donnie Mesa, MD;  Location: [facility];  Service: General;  Laterality: Left;  . patotid cystectomy    . TENDON REPAIR  1980   left ankle and tibia  . TONSILLECTOMY    . TOTAL KNEE ARTHROPLASTY  06/21/2011   Procedure: TOTAL KNEE ARTHROPLASTY;  Surgeon: Gearlean Alf;  Location: [facility];  Service: Orthopedics;  Laterality: Right;  . TUBAL LIGATION      OB History    No data available       Home Medications    Prior to Admission medications   Medication Sig Start  Date End Date Taking? Authorizing Provider  aspirin EC 81 MG tablet Take 81 mg by mouth at bedtime.   Yes [provider]  atorvastatin (LIPITOR) 20 MG tablet Take 1 tablet (20 mg total) by mouth daily. 07/17/17  Yes Opalski, Neoma Laming, DO  carbamazepine (TEGRETOL) 200 MG tablet Take 200 mg by mouth at bedtime.   Yes [provider]  cephALEXin (KEFLEX) 250 MG capsule Take 750 mg by mouth 2 (two) times daily.   Yes [provider]  cycloSPORINE (RESTASIS) 0.05 % ophthalmic emulsion Place 2 drops into both eyes 2 (two) times daily.    Yes [provider]  diphenoxylate-atropine (LOMOTIL)  2.5-0.025 MG per tablet Take 1 tablet by mouth 4 (four) times daily as needed for diarrhea or loose stools.   Yes [provider]  gabapentin (NEURONTIN) 300 MG capsule Take 600-900 mg by mouth See admin instructions. 600 mg by mouth in the morning and 900 mg at bedtime   Yes [provider]  HYDROcodone-acetaminophen (NORCO/VICODIN) 5-325 MG tablet Take 1 tablet by mouth at bedtime as needed. Patient taking differently: Take 1 tablet by mouth 2 (two) times daily as needed (for pain).  09/06/17  Yes Deveshwar, Abel Presto, MD  hydroxychloroquine (PLAQUENIL) 200 MG tablet Take 200 mg by mouth See admin instructions. 200 mg by mouth two times a day on Mon/Tues/Wed/Thurs/Fri only 06/20/17  Yes [provider]  leflunomide (ARAVA) 20 MG tablet Take 20 mg by mouth daily.   Yes [provider]  lidocaine (LIDODERM) 5 % Place 1 patch onto the skin daily. Remove & Discard patch within 12 hours or as directed by MD Patient taking differently: Place 1 patch onto the skin daily as needed (for pain). Remove & Discard patch within 12 hours or as directed by MD 04/30/17  Yes Deveshwar, Abel Presto, MD  metoprolol succinate (TOPROL-XL) 25 MG 24 hr tablet TAKE 1 TABLET BY MOUTH DAILY. Patient taking differently: Take 25 mg by mouth at bedtime 10/11/16  Yes Turner, Eber Hong, MD  Multiple Vitamin (MULTIVITAMIN WITH MINERALS) TABS tablet Take 1 tablet by mouth daily.   Yes [provider]  NONFORMULARY OR COMPOUNDED ITEM Triamcinolone 0.1% & Silvadene cream 1:1- Apply as directed to affected areas   Yes [provider]  potassium chloride (K-DUR) 10 MEQ tablet Take 6 tablets (60 mEq total) by mouth every morning AND 4 tablets (40 mEq total) daily with lunch AND 4 tablets (40 mEq total) every evening. Patient taking differently: Take 60 mEq by mouth in the morning then 40 mEq in the afternoon then 40 mEq at bedtime 09/05/17 09/05/18 Yes Turner, Eber Hong, MD  Probiotic Product  ([place]) 170 MG CAPS Take 1 capsule by mouth every morning.   Yes [provider]  promethazine (PHENERGAN) 25 MG tablet TAKE 1 TABLET BY MOUTH EVERY 6 HOURS AS NEEDED. Patient taking differently: Take 25 mg by mouth every 6 hours as needed for nausea and/or vomiting 01/09/17  Yes Deveshwar, Abel Presto, MD  silver sulfADIAZINE (SILVADENE) 1 % cream Apply 1 application topically daily as needed (for skin infection/irritation.).    Yes [provider]  spironolactone (ALDACTONE) 25 MG tablet Take 25 mg by mouth 2 (two) times daily.   Yes [provider]  tiZANidine (ZANAFLEX) 4 MG tablet TAKE 1 TABLET BY MOUTH DAILY AT BEDTIME Patient taking differently: Take 4 mg by mouth at bedtime 07/30/17  Yes Deveshwar, Abel Presto, MD  torsemide (DEMADEX) 20 MG tablet Take 3 tablets (60  mg total) by mouth 2 (two) times daily. 08/30/17  Yes Turner, Eber Hong, MD  UNABLE TO FIND CPAP: AT bedtime; setting is "12"   Yes [provider]  Vitamin D, Ergocalciferol, (DRISDOL) 50000 units CAPS capsule Take 50,000 Units by mouth every Tuesday.    Yes [provider]  cephALEXin (KEFLEX) 500 MG capsule Take 1 capsule (500 mg total) by mouth 3 (three) times daily. Patient not taking: Reported on 09/26/2017 09/12/17   Bensimhon, Shaune Pascal, MD    Family History Family History  Problem Relation Age of Onset  . Alzheimer's disease Mother   . Heart attack Mother   . Hypertension Mother   . Glaucoma Mother   . Cancer Father   . Heart attack Brother   . Heart disease Brother   . Glaucoma Brother   . Hyperlipidemia Brother   . Glaucoma Brother     Social History Social History   Tobacco Use  . Smoking status: Never Smoker  . Smokeless tobacco: Never Used  Substance Use Topics  . Alcohol use: Yes    Comment: socially   . Drug use: No     Allergies   Sulfa antibiotics; Cymbalta [duloxetine hcl]; Demerol; Ivp dye [iodinated diagnostic agents]; Morphine and  related; Sulfasalazine; and Adhesive [tape]   Review of Systems Review of Systems  All other systems reviewed and are negative.    Physical Exam Updated Vital Signs BP 128/67 (BP Location: Left Arm)   Pulse 86   Temp 98.9 F (37.2 C) (Oral)   Resp 16   Ht 1.575 m (5\' 2" )   Wt 106.6 kg (235 lb)   SpO2 99%   BMI 42.98 kg/m   Physical Exam  Constitutional: She appears well-developed and well-nourished. No distress.  HENT:  Head: Normocephalic and atraumatic.  Right Ear: External ear normal.  Left Ear: External ear normal.  Eyes: Conjunctivae are normal. Right eye exhibits no discharge. Left eye exhibits no discharge. No scleral icterus.  Neck: Neck supple. No tracheal deviation present.  Cardiovascular: Normal rate, regular rhythm and intact distal pulses.  Pulmonary/Chest: Effort normal and breath sounds normal. No stridor. No respiratory distress. She has no wheezes. She has no rales.  Abdominal: Soft. Bowel sounds are normal. She exhibits no distension. There is no tenderness. There is no rebound and no guarding.  Musculoskeletal: She exhibits no edema or tenderness.  Neurological: She is alert. She has normal strength. No cranial nerve deficit (no facial droop, extraocular movements intact, no slurred speech) or sensory deficit. She exhibits normal muscle tone. She displays no seizure activity. Coordination normal.  Skin: Skin is warm and dry. No rash noted.  Psychiatric: She has a normal mood and affect.  Nursing note and vitals reviewed.    ED Treatments / Results  Labs (all labs ordered are listed, but only abnormal results are displayed) Labs Reviewed  BASIC METABOLIC PANEL - Abnormal; Notable for the following components:      Result Value   Glucose, Bld 119 (*)    Creatinine, Ser 1.14 (*)    GFR calc non Af Amer 49 (*)    GFR calc Af Amer 57 (*)    All other components within normal limits  CBC - Abnormal; Notable for the following components:   WBC 11.0  (*)    All other components within normal limits  BRAIN NATRIURETIC PEPTIDE  I-STAT TROPONIN, ED  I-STAT TROPONIN, ED    EKG  EKG Interpretation  Date/Time:  Wednesday September 26 2017 13:12:12 EST Ventricular Rate:  89 PR Interval:  156 QRS Duration: 92 QT Interval:  368 QTC Calculation: 447 R Axis:   -29 Text Interpretation:  Sinus rhythm with frequent Premature ventricular complexes Cannot rule out Anterior infarct , age undetermined Abnormal ECG pvc are new since last tracing Confirmed by Dorie Rank [phone]) on 09/26/2017 6:23:58 PM Also confirmed by Dorie Rank [phone])  on 09/26/2017 11:43:23 PM       Radiology Dg Chest 2 View  Result Date: 09/26/2017 CLINICAL DATA:  Chest pain. EXAM: CHEST  2 VIEW COMPARISON:  Chest x-ray dated 02/06/2017 and chest CT dated 03/29/2009 FINDINGS: Heart size and pulmonary vascularity are normal. Slight atelectasis or small infiltrate at the left lung base. Prominent left pericardial fat pad as demonstrated on the prior CT scan. No effusions.  No acute bone abnormality. IMPRESSION: Small area of focal atelectasis or infiltrate at the left lung base. Electronically Signed   By: Lorriane Shire M.D.   On: 09/26/2017 13:57    Procedures Procedures (including critical care time)  Medications Ordered in ED Medications  fentaNYL (SUBLIMAZE) injection 50 mcg (50 mcg Intravenous Given 09/26/17 1924)  hydrocortisone sodium succinate (SOLU-CORTEF) injection 200 mg (200 mg Intravenous Given 09/26/17 1945)  diphenhydrAMINE (BENADRYL) capsule 50 mg (50 mg Oral Given 09/26/17 2132)    Or  diphenhydrAMINE (BENADRYL) injection 50 mg ( Intravenous See Alternative 09/26/17 2132)  iopamidol (ISOVUE-370) 76 % injection (100 mLs  Contrast Given 09/26/17 2352)     Initial Impression / Assessment and Plan / ED Course  I have reviewed the triage vital signs and the nursing notes.  Pertinent labs & imaging results that were available during my care of the patient were  reviewed by me and considered in my medical decision making (see chart for details).  Clinical Course as of Sep 28 7  Wed Sep 26, 2017  2337 2nd troponin is normal.  Updated pt on result.  [JK]    Clinical Course User Index [JK] Dorie Rank, MD    Pt presented to the ED with chest pain.  Low to moderate risk of MACE per heart score.  2 sets of troponin are normal.  With her severe pain radiating to the back CT scan of the chest ordered to evaluate for dissection.  Plan on outpatient pcp, cardiology follow up if the CT scan is negative.  Dr Randal Buba will follow up on the CT scan  Final Clinical Impressions(s) / ED Diagnoses   Final diagnoses:  Chest pain, unspecified type    ED Discharge Orders    None       Dorie Rank, MD 09/27/17 0010

## 2017-09-27 DIAGNOSIS — I700 Atherosclerosis of aorta: Secondary | ICD-10-CM | POA: Diagnosis not present

## 2017-09-27 DIAGNOSIS — R079 Chest pain, unspecified: Secondary | ICD-10-CM | POA: Diagnosis not present

## 2017-09-27 MED ORDER — IBUPROFEN 400 MG PO TABS: 400.0000 mg | ORAL_TABLET | Freq: Four times a day (QID) | ORAL | 0 refills | Status: DC | PRN

## 2017-09-27 NOTE — Discharge Instructions (Signed)
Follow up with your cardiologist, take the medications as prescribed, return as needed for worsening symptoms

## 2017-10-11 ENCOUNTER — Ambulatory Visit (HOSPITAL_COMMUNITY)
Admission: RE | Admit: 2017-10-11 | Discharge: 2017-10-11 | Disposition: A | Discharge status: Home / Self Care | Payer: PPO | Source: Ambulatory Visit | Attending: Internal Medicine | Admitting: Internal Medicine

## 2017-10-11 ENCOUNTER — Other Ambulatory Visit: Payer: Self-pay

## 2017-10-11 ENCOUNTER — Encounter (HOSPITAL_COMMUNITY): Payer: Self-pay | Admitting: Internal Medicine

## 2017-10-11 DIAGNOSIS — Z7982 Long term (current) use of aspirin: Secondary | ICD-10-CM | POA: Insufficient documentation

## 2017-10-11 DIAGNOSIS — I5032 Chronic diastolic (congestive) heart failure: Secondary | ICD-10-CM | POA: Diagnosis not present

## 2017-10-11 DIAGNOSIS — Z809 Family history of malignant neoplasm, unspecified: Secondary | ICD-10-CM | POA: Insufficient documentation

## 2017-10-11 DIAGNOSIS — Z8249 Family history of ischemic heart disease and other diseases of the circulatory system: Secondary | ICD-10-CM | POA: Diagnosis not present

## 2017-10-11 DIAGNOSIS — M069 Rheumatoid arthritis, unspecified: Secondary | ICD-10-CM | POA: Diagnosis not present

## 2017-10-11 DIAGNOSIS — I110 Hypertensive heart disease with heart failure: Secondary | ICD-10-CM | POA: Diagnosis not present

## 2017-10-11 DIAGNOSIS — Z79899 Other long term (current) drug therapy: Secondary | ICD-10-CM | POA: Diagnosis not present

## 2017-10-11 DIAGNOSIS — Z83511 Family history of glaucoma: Secondary | ICD-10-CM | POA: Insufficient documentation

## 2017-10-11 DIAGNOSIS — I493 Ventricular premature depolarization: Secondary | ICD-10-CM | POA: Insufficient documentation

## 2017-10-11 DIAGNOSIS — R072 Precordial pain: Secondary | ICD-10-CM

## 2017-10-11 DIAGNOSIS — Z6841 Body Mass Index (BMI) 40.0 and over, adult: Secondary | ICD-10-CM | POA: Insufficient documentation

## 2017-10-11 DIAGNOSIS — G4733 Obstructive sleep apnea (adult) (pediatric): Secondary | ICD-10-CM | POA: Diagnosis not present

## 2017-10-11 DIAGNOSIS — Z820 Family history of epilepsy and other diseases of the nervous system: Secondary | ICD-10-CM | POA: Insufficient documentation

## 2017-10-11 DIAGNOSIS — G909 Disorder of the autonomic nervous system, unspecified: Secondary | ICD-10-CM | POA: Diagnosis not present

## 2017-10-11 DIAGNOSIS — M797 Fibromyalgia: Secondary | ICD-10-CM | POA: Diagnosis not present

## 2017-10-11 LAB — BASIC METABOLIC PANEL
ANION GAP: 17 — AB (ref 5–15)
BUN: 18 mg/dL (ref 6–20)
CO2: 27 mmol/L (ref 22–32)
Calcium: 9.3 mg/dL (ref 8.9–10.3)
Chloride: 94 mmol/L — ABNORMAL LOW (ref 101–111)
Creatinine, Ser: 1.08 mg/dL — ABNORMAL HIGH (ref 0.44–1.00)
GFR, EST NON AFRICAN AMERICAN: 53 mL/min — AB (ref >60)
Glucose, Bld: 126 mg/dL — ABNORMAL HIGH (ref 65–99)
Potassium: 3.8 mmol/L (ref 3.5–5.1)
Sodium: 138 mmol/L (ref 135–145)

## 2017-10-11 NOTE — Progress Notes (Signed)
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   Says she started having problems with volume overload in July 2018. She has been followed by Dr Radford Pax. Has tried multiple diuretics but says nothing has worked very well except lasix and metolazone but that bottoms out her potassium. Last seen on 08/1717. At that time torsemide was increased to 60 mg twice a day due to volume overload. Weight at that time was 235 pounds.   Returns for follow-op. Was seen in ER on 2/13 with CP. Troponin 0.0. CT chest no PE. Minimal coronary calcium. Diagnosed with pleurisy. Says overall doing very well. Fluid much improved. Taking metolazone about once a week. Cutting back on fluids. CP much improved. Says she is going out constantly with her friends and doing fine.     ECHO 02/2017  - Left ventricle: The cavity size was normal. Wall thickness was   increased in a pattern of mild to moderate LVH. Systolic function was   normal. The estimated ejection fraction was in the range of 60%   to 65%. Wall motion was normal; there were no regional wall   motion abnormalities. Doppler parameters are consistent with   abnormal left ventricular relaxation (grade 1 diastolic   dysfunction).  ROS: All systems negative except as listed in HPI, PMH and Problem List.  SH:  Social History   Socioeconomic History  . Marital status: Married    Spouse name: Not on file  . Number of children: 2  . Years of education: AS  . Highest education level: Not on file  Social Needs  . Financial resource strain: Not on file  . Food insecurity - worry: Not on file  . Food insecurity - inability: Not on file  . Transportation needs - medical: Not on file  . Transportation needs - non-medical:  Not on file  Occupational History  . Occupation: disability  . Occupation: Therapist, sports  Tobacco Use  . Smoking status: Never Smoker  . Smokeless tobacco: Never Used  Substance and Sexual Activity  . Alcohol use: Yes    Comment: socially   . Drug use: No  . Sexual activity: No  Other Topics Concern  . Not on file  Social History Narrative   Lives at home alone   Right-handed   Drinks 1 or less cups of coffee and 2 servings of either tea or soda per day    FH:  Family History  Problem Relation Age of Onset  . Alzheimer's disease Mother   . Heart attack Mother   . Hypertension Mother   . Glaucoma Mother   . Cancer Father   . Heart attack Brother   . Heart disease Brother   . Glaucoma Brother   . Hyperlipidemia Brother   . Glaucoma Brother     Past Medical History:  Diagnosis Date  . Arthritis    Rheumatoid arthritis,   . Blood transfusion    1981  . Chronic diastolic CHF (congestive heart failure) (HCC)    diastolic   . Fibromyalgia   . Heart murmur    as a child  . Hiatal hernia    sjorgens syndrome  . OSA (obstructive sleep apnea)   . Peripheral autonomic neuropathy  of unknown cause     Current Outpatient Medications  Medication Sig Dispense Refill  . aspirin EC 81 MG tablet Take 81 mg by mouth at bedtime.    [name] atorvastatin (LIPITOR) 20 MG tablet Take 1 tablet (20 mg total) by mouth daily. 90 tablet 3  . carbamazepine (TEGRETOL) 200 MG tablet Take 200 mg by mouth at bedtime.    . cycloSPORINE (RESTASIS) 0.05 % ophthalmic emulsion Place 2 drops into both eyes 2 (two) times daily.     . diphenoxylate-atropine (LOMOTIL) 2.5-0.025 MG per tablet Take 1 tablet by mouth 4 (four) times daily as needed for diarrhea or loose stools.    . gabapentin (NEURONTIN) 300 MG capsule Take 600-900 mg by mouth See admin instructions. 600 mg by mouth in the morning and 900 mg at bedtime    . HYDROcodone-acetaminophen (NORCO/VICODIN) 5-325 MG tablet Take 1 tablet by mouth at bedtime as  needed. 30 tablet 0  . hydroxychloroquine (PLAQUENIL) 200 MG tablet Take 200 mg by mouth See admin instructions. 200 mg by mouth two times a day on Mon/Tues/Wed/Thurs/Fri only  0  . ibuprofen (ADVIL,MOTRIN) 400 MG tablet Take 1 tablet (400 mg total) by mouth every 6 (six) hours as needed. 30 tablet 0  . leflunomide (ARAVA) 20 MG tablet Take 20 mg by mouth daily.    [name] lidocaine (LIDODERM) 5 % Place 1 patch onto the skin daily. Remove & Discard patch within 12 hours or as directed by MD (Patient taking differently: Place 1 patch onto the skin daily as needed (for pain). Remove & Discard patch within 12 hours or as directed by MD) 30 patch 0  . metolazone (ZAROXOLYN) 2.5 MG tablet Take 2.5 mg by mouth daily as needed.    . metoprolol succinate (TOPROL-XL) 25 MG 24 hr tablet TAKE 1 TABLET BY MOUTH DAILY. 90 tablet 2  . Multiple Vitamin (MULTIVITAMIN WITH MINERALS) TABS tablet Take 1 tablet by mouth daily.    . NONFORMULARY OR COMPOUNDED ITEM Triamcinolone 0.1% & Silvadene cream 1:1- Apply as directed to affected areas    . potassium chloride (K-DUR) 10 MEQ tablet Take 6 tablets (60 mEq total) by mouth every morning AND 4 tablets (40 mEq total) daily with lunch AND 4 tablets (40 mEq total) every evening. 1260 tablet 3  . Probiotic Product ([place]) 170 MG CAPS Take 1 capsule by mouth every morning.    . promethazine (PHENERGAN) 25 MG tablet TAKE 1 TABLET BY MOUTH EVERY 6 HOURS AS NEEDED. 120 tablet 2  . silver sulfADIAZINE (SILVADENE) 1 % cream Apply 1 application topically daily as needed (for skin infection/irritation.).     [name] spironolactone (ALDACTONE) 25 MG tablet Take 25 mg by mouth 2 (two) times daily.    [name] tiZANidine (ZANAFLEX) 4 MG tablet TAKE 1 TABLET BY MOUTH DAILY AT BEDTIME 90 tablet 1  . torsemide (DEMADEX) 20 MG tablet Take 3 tablets (60 mg total) by mouth 2 (two) times daily. 540 tablet 3  . UNABLE TO FIND CPAP: AT bedtime; setting is "12"    . Vitamin D, Ergocalciferol,  (DRISDOL) 50000 units CAPS capsule Take 50,000 Units by mouth every Tuesday.      No current facility-administered medications for this encounter.     Vitals:   10/11/17 1106  BP: 120/60  Pulse: 84  SpO2: 98%  Weight: 232 lb 4 oz (105.3 kg)    PHYSICAL EXAM: General:  Obese woman. No resp difficulty HEENT: normal Neck: supple. no JVD. Carotids  2+ bilat; no bruits. No lymphadenopathy or thryomegaly appreciated. Cor: PMI nondisplaced. Regular rate & rhythm. No rubs, gallops or murmurs. Lungs: clear Abdomen: obese soft, nontender, nondistended. No hepatosplenomegaly. No bruits or masses. Good bowel sounds. Extremities: no cyanosis, clubbing, rash, edema mild erythema R ankle with trace edema  Neuro: alert & orientedx3, cranial nerves grossly intact. moves all 4 extremities w/o difficulty. Affect pleasant   ASSESSMENT & PLAN:  1. Chronic Diastolic Heart Failure  - Echo 7/18 reviewed personally. EF normal with mild to moderate LVH. Grade 1 DD. RV normal - Volume status looks good. Continue current diuretic regimen with sliding scale as above - Check labs today - R ankle seems to have some venous insufficiency. Encourage her to wrap.   2. PVCs - per Dr. Radford Pax  3. CP - doubt ischemic. No further symptoms. Chest CT reviewed personally - minimal calcium. Would continue to manage medically if symptoms recur can consider L/R cath  4. OSA -Continue CPAP nightly  5. Obesity Body mass index is 42.48 kg/m.  - I suspect this is a major part of her symptoms currently - Encouraged weight loss.    6. HTN Blood pressure well controlled. Continue current regimen.  Glori Bickers, MD  11:11 AM

## 2017-10-11 NOTE — Patient Instructions (Signed)
Labs today  We will contact you in 6 months to schedule your next appointment.  

## 2017-10-11 NOTE — Addendum Note (Signed)
Encounter addended by: Scarlette Calico, RN on: 10/11/2017 11:35 AM  Actions taken: Order list changed, Diagnosis association updated, Sign clinical note

## 2017-10-16 DIAGNOSIS — H35033 Hypertensive retinopathy, bilateral: Secondary | ICD-10-CM | POA: Diagnosis not present

## 2017-10-16 DIAGNOSIS — M069 Rheumatoid arthritis, unspecified: Secondary | ICD-10-CM | POA: Diagnosis not present

## 2017-10-16 DIAGNOSIS — H40013 Open angle with borderline findings, low risk, bilateral: Secondary | ICD-10-CM | POA: Diagnosis not present

## 2017-10-16 DIAGNOSIS — Z79899 Other long term (current) drug therapy: Secondary | ICD-10-CM | POA: Diagnosis not present

## 2017-10-23 ENCOUNTER — Other Ambulatory Visit: Payer: Self-pay | Admitting: Cardiology

## 2017-11-08 DIAGNOSIS — K580 Irritable bowel syndrome with diarrhea: Secondary | ICD-10-CM | POA: Diagnosis not present

## 2017-11-15 ENCOUNTER — Ambulatory Visit: Payer: PPO | Admitting: Family Medicine

## 2017-11-20 ENCOUNTER — Ambulatory Visit (INDEPENDENT_AMBULATORY_CARE_PROVIDER_SITE_OTHER): Payer: PPO | Admitting: Family Medicine

## 2017-11-20 ENCOUNTER — Telehealth: Payer: Self-pay | Admitting: Neurology

## 2017-11-20 ENCOUNTER — Encounter: Payer: Self-pay | Admitting: Family Medicine

## 2017-11-20 ENCOUNTER — Encounter: Payer: Self-pay | Admitting: Neurology

## 2017-11-20 ENCOUNTER — Ambulatory Visit: Payer: PPO | Admitting: Neurology

## 2017-11-20 VITALS — BP 120/76 | HR 76 | Ht 62.0 in | Wt 237.4 lb

## 2017-11-20 DIAGNOSIS — R252 Cramp and spasm: Secondary | ICD-10-CM

## 2017-11-20 DIAGNOSIS — R7302 Impaired glucose tolerance (oral): Secondary | ICD-10-CM | POA: Diagnosis not present

## 2017-11-20 DIAGNOSIS — Z79899 Other long term (current) drug therapy: Secondary | ICD-10-CM

## 2017-11-20 DIAGNOSIS — E782 Mixed hyperlipidemia: Secondary | ICD-10-CM | POA: Diagnosis not present

## 2017-11-20 DIAGNOSIS — E559 Vitamin D deficiency, unspecified: Secondary | ICD-10-CM | POA: Diagnosis not present

## 2017-11-20 DIAGNOSIS — E876 Hypokalemia: Secondary | ICD-10-CM

## 2017-11-20 DIAGNOSIS — Z8639 Personal history of other endocrine, nutritional and metabolic disease: Secondary | ICD-10-CM

## 2017-11-20 DIAGNOSIS — I5032 Chronic diastolic (congestive) heart failure: Secondary | ICD-10-CM

## 2017-11-20 DIAGNOSIS — M810 Age-related osteoporosis without current pathological fracture: Secondary | ICD-10-CM | POA: Diagnosis not present

## 2017-11-20 DIAGNOSIS — E785 Hyperlipidemia, unspecified: Secondary | ICD-10-CM | POA: Insufficient documentation

## 2017-11-20 MED ORDER — DTAP-IPV VACCINE IM SUSP: 0.5000 mL | Freq: Once | INTRAMUSCULAR | 0 refills | Status: AC

## 2017-11-20 NOTE — Patient Instructions (Addendum)
Please make sure you are following up with the congestive heart failure clinic as instructed.  In the near future please come in for fasting blood work so we can recheck your cholesterol since starting you on a statin back in December 2018.  -Also very recheck your vitamin D, electrolytes, A1c etc. in addition.  Please see all the labs will be checking in the near future under orders.     Risk factors for prediabetes and type 2 diabetes  Researchers don't fully understand why some people develop prediabetes and type 2 diabetes and others don't.  It's clear that certain factors increase the risk, however, including:  Weight. The more fatty tissue you have, the more resistant your cells become to insulin.  Inactivity. The less active you are, the greater your risk. Physical activity helps you control your weight, uses up glucose as energy and makes your cells more sensitive to insulin.  Family history. Your risk increases if a parent or sibling has type 2 diabetes.  Race. Although it's unclear why, people of certain races - including blacks, Hispanics, American Indians and Asian-Americans - are at higher risk.  Age. Your risk increases as you get older. This may be because you tend to exercise less, lose muscle mass and gain weight as you age. But type 2 diabetes is also increasing dramatically among children, adolescents and younger adults.  Gestational diabetes. If you developed gestational diabetes when you were pregnant, your risk of developing prediabetes and type 2 diabetes later increases. If you gave birth to a baby weighing more than 9 pounds (4 kilograms), you're also at risk of type 2 diabetes.  Polycystic ovary syndrome. For women, having polycystic ovary syndrome - a common condition characterized by irregular menstrual periods, excess hair growth and obesity - increases the risk of diabetes.  High blood pressure. Having blood pressure over 140/90 millimeters of mercury (mm Hg) is linked  to an increased risk of type 2 diabetes.  Abnormal cholesterol and triglyceride levels. If you have low levels of high-density lipoprotein (HDL), or "good," cholesterol, your risk of type 2 diabetes is higher. Triglycerides are another type of fat carried in the blood. People with high levels of triglycerides have an increased risk of type 2 diabetes. Your doctor can let you know what your cholesterol and triglyceride levels are.  A good guide to good carbs: The glycemic index ---If you have diabetes, or at risk for diabetes, you know all too well that when you eat carbohydrates, your blood sugar goes up. The total amount of carbs you consume at a meal or in a snack mostly determines what your blood sugar will do. But the food itself also plays a role. A serving of white rice has almost the same effect as eating pure table sugar - a quick, high spike in blood sugar. A serving of lentils has a slower, smaller effect.  ---Picking good sources of carbs can help you control your blood sugar and your weight. Even if you don't have diabetes, eating healthier carbohydrate-rich foods can help ward off a host of chronic conditions, from heart disease to various cancers to, well, diabetes.  ---One way to choose foods is with the glycemic index (GI). This tool measures how much a food boosts blood sugar.  The glycemic index rates the effect of a specific amount of a food on blood sugar compared with the same amount of pure glucose. A food with a glycemic index of 28 boosts blood sugar only 28% as  much as pure glucose. One with a GI of 95 acts like pure glucose.    High glycemic foods result in a quick spike in insulin and blood sugar (also known as blood glucose).  Low glycemic foods have a slower, smaller effect- these are healthier for you.   Using the glycemic index Using the glycemic index is easy: choose foods in the low GI category instead of those in the high GI category (see below), and go easy on those  in between. Low glycemic index (GI of 55 or less): Most fruits and vegetables, beans, minimally processed grains, pasta, low-fat dairy foods, and nuts.  Moderate glycemic index (GI 56 to 69): White and sweet potatoes, corn, white rice, couscous, breakfast cereals such as Cream of Wheat and Mini Wheats.  High glycemic index (GI of 70 or higher): White bread, rice cakes, most crackers, bagels, cakes, doughnuts, croissants, most packaged breakfast cereals. You can see the values for 100 commons foods and get links to more at www.health.CheapToothpicks.si.  Swaps for lowering glycemic index  Instead of this high-glycemic index food Eat this lower-glycemic index food  White rice Brown rice or converted rice  Instant oatmeal Steel-cut oats  Cornflakes Bran flakes  Baked potato Pasta, bulgur  White bread Whole-grain bread  Corn Peas or leafy greens       Prediabetes Eating Plan  Prediabetes--also called impaired glucose tolerance or impaired fasting glucose--is a condition that causes blood sugar (blood glucose) levels to be higher than normal. Following a healthy diet can help to keep prediabetes under control. It can also help to lower the risk of type 2 diabetes and heart disease, which are increased in people who have prediabetes. Along with regular exercise, a healthy diet:  Promotes weight loss.  Helps to control blood sugar levels.  Helps to improve the way that the body uses insulin.   WHAT DO I NEED TO KNOW ABOUT THIS EATING PLAN?   Use the glycemic index (GI) to plan your meals. The index tells you how quickly a food will raise your blood sugar. Choose low-GI foods. These foods take a longer time to raise blood sugar.  Pay close attention to the amount of carbohydrates in the food that you eat. Carbohydrates increase blood sugar levels.  Keep track of how many calories you take in. Eating the right amount of calories will help you to achieve a healthy weight. Losing about 7  percent of your starting weight can help to prevent type 2 diabetes.  You may want to follow a Mediterranean diet. This diet includes a lot of vegetables, lean meats or fish, whole grains, fruits, and healthy oils and fats.   WHAT FOODS CAN I EAT?  Grains Whole grains, such as whole-wheat or whole-grain breads, crackers, cereals, and pasta. Unsweetened oatmeal. Bulgur. Barley. Quinoa. Brown rice. Corn or whole-wheat flour tortillas or taco shells. Vegetables Lettuce. Spinach. Peas. Beets. Cauliflower. Cabbage. Broccoli. Carrots. Tomatoes. Squash. Eggplant. Herbs. Peppers. Onions. Cucumbers. Brussels sprouts. Fruits Berries. Bananas. Apples. Oranges. Grapes. Papaya. Mango. Pomegranate. Kiwi. Grapefruit. Cherries. Meats and Other Protein Sources Seafood. Lean meats, such as chicken and [place] or lean cuts of pork and beef. Tofu. Eggs. Nuts. Beans. Dairy Low-fat or fat-free dairy products, such as yogurt, cottage cheese, and cheese. Beverages Water. Tea. Coffee. Sugar-free or diet soda. Seltzer water. Milk. Milk alternatives, such as soy or almond milk. Condiments Mustard. Relish. Low-fat, low-sugar ketchup. Low-fat, low-sugar barbecue sauce. Low-fat or fat-free mayonnaise. Sweets and Desserts Sugar-free or low-fat pudding.  Sugar-free or low-fat ice cream and other frozen treats. Fats and Oils Avocado. Walnuts. Olive oil. The items listed above may not be a complete list of recommended foods or beverages. Contact your dietitian for more options.    WHAT FOODS ARE NOT RECOMMENDED?  Grains Refined white flour and flour products, such as bread, pasta, snack foods, and cereals. Beverages Sweetened drinks, such as sweet iced tea and soda. Sweets and Desserts Baked goods, such as cake, cupcakes, pastries, cookies, and cheesecake. The items listed above may not be a complete list of foods and beverages to avoid. Contact your dietitian for more information.   This information is not  intended to replace advice given to you by your health care provider. Make sure you discuss any questions you have with your health care provider.   Document Released: 12/15/2014 Document Reviewed: 12/15/2014 Elsevier Interactive Patient Education [employer].

## 2017-11-20 NOTE — Progress Notes (Signed)
Impression and Recommendations:    1. High risk medication use   2. Morbid obesity ([place])   3. History of diabetes mellitus   4. Vitamin D deficiency   5. Mixed hyperlipidemia   6. Elevated triglycerides with high cholesterol   7. On potassium wasting diuretic therapy   8. Glucose intolerance (impaired glucose tolerance)   9. Chronic diastolic CHF (congestive heart failure) ([place])   10. Hypokalemia   11. Osteoporosis   12. Muscle cramps     1. Congestive Heart Failure - Patient knows that it is critically important for her to continue following up with the CHF clinic.  - Patient does not have an appointment scheduled currently, but needs a potassium draw.  - Encouraged patient to ask cardiology if some kind of cardiac rehab may help with her mobility.  2. HLD - Started on Lipitor last appointment.  Patient notes cramps, but is limited in her water intake due to CHF.    - Overall notes that the Lipitor is reasonable to tolerate.  Patient knows that muscle cramps can occur on Lipitor, which can be minimized by increased water intake and increased physical activity.  - Cardiology did not object to beginning treatment on Lipitor.  3. HTN - Blood pressure well controlled.  Continue on medications as prescribed.  4. Prediabetes/Glucose Intolerance - Will re-check A1c with next blood work.  5. General Health Maintenance Explained to patient what BMI refers to, and what it means medically.    Told patient to think about it as a "medical risk stratification measurement" and how increasing BMI is associated with increasing risk/ or worsening state of various diseases such as hypertension, hyperlipidemia, diabetes, premature OA, depression etc.  American Heart Association guidelines for healthy diet, basically Mediterranean diet, and exercise guidelines of 30 minutes 5 days per week or more discussed in detail.  Health counseling performed.  All questions answered.  - Advised  patient to continue working toward exercising to improve health.    - PT may begin with 15 minutes of activity daily.  Recommended that the patient eventually strive for at least 150 minutes of cardio per week according to guidelines established by the [facility].   - Healthy dietary habits encouraged, including low-carb, and high amounts of lean protein in diet.   - Patient should also consume adequate amounts of water - half of body weight in oz of water per day  6. Follow-Up - Due for lab work 4 months from starting her Lipitor December 2018. - Patient will return for fasting lab work at her earliest convenience. - Lab orders placed for near future - cholesterol, D, A1c, magnesium.   Orders Placed This Encounter  Procedures  . VITAMIN D 25 Hydroxy (Vit-D Deficiency, Fractures)  . Magnesium  . Phosphorus  . Lipid panel  . Hemoglobin A1c  . TSH  . T4, free  . Comprehensive metabolic panel  . CK Total (and CKMB)    Meds ordered this encounter  Medications  . DTaP-IPV Lynann Bologna) injection    Sig: Inject 0.5 mLs into the muscle once for 1 dose.    Dispense:  0.5 mL    Refill:  0    Gross side effects, risk and benefits, and alternatives of medications and treatment plan in general discussed with patient.  Patient is aware that all medications have potential side effects and we are unable to predict every side effect or drug-drug interaction that may occur.   Patient will call  with any questions prior to using medication if they have concerns.  Expresses verbal understanding and consents to current therapy and treatment regimen.  No barriers to understanding were identified.  Red flag symptoms and signs discussed in detail.  Patient expressed understanding regarding what to do in case of emergency\urgent symptoms  Please see AVS handed out to patient at the end of our visit for further patient instructions/ counseling done pertaining to today's office visit.   Return for 26moro so - reck  Chronic conditions.    Note: This note was prepared with assistance of Dragon voice recognition software. Occasional wrong-word or sound-a-like substitutions may have occurred due to the inherent limitations of voice recognition software.   This document serves as a record of services personally performed by DMellody Dance DO. It was created on her behalf by KToni Amend a trained medical scribe. The creation of this record is based on the scribe's personal observations and the provider's statements to them.   I have reviewed the above medical documentation for accuracy and completeness and I concur.  DMellody Dance04/09/19 6:13 PM   ----------------------------------------------------------------------------------------------------------------------------------------------------------   Subjective:     HPI: Marisa SKAFFis a 67y.o. female [facility].  [place]  Plans to take her walker with her for mobility.  Overall emotionally she feels well.  "I tolerate these things, I just do."  Congestive Heart Failure No new concerns about her CHF.   Follows up at high risk congestive heart failure clinic.  Seeing Dr. TGolden Hurterand Dr. BHaroldine Laws She's been told to call the clinic if her weight goes up 5 or 6 lbs per week.  Comments about her weight on diuretics and notes doing a Metolazone lasix coctail when her weight is really up. The rest of the time, she takes 120 mg of Demadex per day.  Hasn't had a potassium check in a month.   Notes that her potassium level keeps bottoming in spite of taking a large amount.  At night, most of the time, her breathing is okay.  Denies alarming cardiac symptoms.  Prediabetes Patient has no idea what her A1c would measure lately. She has not been good with her diet regarding sugars and carbs.  Patient notes that  she's been walking as much as she can, but to walk any distance at all, she needs her walker.  She often avoids using the walker due to her pride.  Cholesterol 1. 67y.o. female here for cholesterol follow-up.   - Patient started on Lipitor last appointment, 07/17/2017.  She was compliant with getting lab work to check her liver enzymes.  - Patient reports good compliance with medications or treatment plan.  Takes the Lipitor every night before bed, and continues on baby aspirin.  - Notes having cramps on the Lipitor.  Notes that she may need a magnesium check. Notes that she has congestive heart failure, so her water intake is limited.   Overall, taking the medication is tolerable.   - Smoking Status noted   - She denies new onset of: chest pain, exercise intolerance, shortness of breath, dizziness, visual changes, headache, lower extremity swelling or claudication.   The cholesterol last visit was:  Lab Results  Component Value Date   CHOL 249 (H) 07/02/2017   HDL 50 07/02/2017   LDLCALC 138 (H) 07/02/2017   TRIG  304 (H) 07/02/2017   CHOLHDL 5.0 (H) 07/02/2017    Hepatic Function Latest Ref Rng & Units 09/06/2017 08/15/2017 07/02/2017  Total Protein 6.1 - 8.1 g/dL 6.8 - 6.9  Albumin 3.6 - 4.8 g/dL - - 4.4  AST 10 - 35 U/L 23 - 17  ALT 6 - 29 U/L '17 18 15  '$ Alk Phosphatase 39 - 117 IU/L - - 134(H)  Total Bilirubin 0.2 - 1.2 mg/dL 0.3 - 0.3  Bilirubin, Direct 0.0 - 0.3 mg/dL - - -    Wt Readings from Last 3 Encounters:  11/20/17 237 lb 6.4 oz (107.7 kg)  10/11/17 232 lb 4 oz (105.3 kg)  09/26/17 235 lb (106.6 kg)   BP Readings from Last 3 Encounters:  11/20/17 120/76  10/11/17 120/60  09/27/17 111/72   Pulse Readings from Last 3 Encounters:  11/20/17 76  10/11/17 84  09/27/17 86   BMI Readings from Last 3 Encounters:  11/20/17 43.42 kg/m  10/11/17 42.48 kg/m  09/26/17 42.98 kg/m     Patient Care Team    Relationship Specialty Notifications Start End    Mellody Dance, DO PCP - General Family Medicine  01/19/16   Bo Merino, MD Consulting Physician Rheumatology  02/02/14   Sueanne Margarita, MD Consulting Physician Cardiology  05/23/16   Gaynelle Arabian, MD Consulting Physician Orthopedic Surgery  05/23/16   Kathrynn Ducking, MD Consulting Physician Neurology  05/23/16   Juanita Craver, MD Consulting Physician Gastroenterology  05/23/16   [place], Amy, MD Consulting Physician Dermatology  05/23/16   Corliss Parish, MD Consulting Physician Nephrology  07/02/17    Comment: Seen for hypokalemia.     Patient Active Problem List   Diagnosis Date Noted  . Elevated triglycerides with high cholesterol 08/23/2016    Priority: High  . Peripheral polyneuropathy 02/02/2014    Priority: High  . Morbid obesity ([place]) 11/17/2013    Priority: High  . OSA (obstructive sleep apnea)     Priority: High  . Low serum HDL 08/23/2016    Priority: Medium  . GERD (gastroesophageal reflux disease) 01/19/2016    Priority: Medium  . Fibromyalgia     Priority: Medium  . Sjoegren syndrome 02/29/2016    Priority: Low  . On potassium wasting diuretic therapy 02/29/2016    Priority: Low  . Glucose intolerance (impaired glucose tolerance) 01/19/2016    Priority: Low  . Chronic diastolic CHF (congestive heart failure) ([place]) 04/20/2015    Priority: Low  . Rheumatoid arthritis ([place])     Priority: Low  . Vitamin D deficiency 11/20/2017  . Mixed hyperlipidemia 11/20/2017  . Muscle cramps 11/20/2017  . Family history of coronary arteriosclerosis- strong fam h/o CAD and early CAD.  07/17/2017  . Raynaud's disease without gangrene 03/12/2017  . Abnormal weight gain 02/06/2017  . Shingles outbreak 02/06/2017  . Cyst (solitary) of breast, left 01/31/2017  . Eosinophilic esophagitis 88/50/2774  . History of hyperlipidemia 10/04/2016  . 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 History of diabetes mellitus 09/09/2016  . History of diverticulitis  09/03/2016  . Osteoporosis 09/03/2016  . Autoimmune disease ([place]) 09/02/2016  . High risk medication use 09/02/2016  . History of esophagitis 09/02/2016  . Breast cyst, left 06/14/2016  . Encounter for wellness examination 05/23/2016  . Abnormality of gait 05/09/2016  . Hypokalemia 03/22/2016  . Heart murmur   . Hiatal hernia   . PVC's (premature ventricular contractions)   . 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 Unilateral  primary osteoarthritis, left knee 06/22/2011    Past Medical history, Surgical history, Family history, Social history, Allergies and Medications have been entered into the medical record, reviewed and changed as needed.    Current Meds  Medication Sig  . aspirin EC 81 MG tablet Take 81 mg by mouth at bedtime.  [name] atorvastatin (LIPITOR) 20 MG tablet Take 1 tablet (20 mg total) by mouth daily.  . carbamazepine (TEGRETOL) 200 MG tablet Take 200 mg by mouth at bedtime.  . cycloSPORINE (RESTASIS) 0.05 % ophthalmic emulsion Place 2 drops into both eyes 2 (two) times daily.   . diphenoxylate-atropine (LOMOTIL) 2.5-0.025 MG per tablet Take 1 tablet by mouth 4 (four) times daily as needed for diarrhea or loose stools.  . gabapentin (NEURONTIN) 300 MG capsule Take 600-900 mg by mouth See admin instructions. 600 mg by mouth in the morning and 900 mg at bedtime  . HYDROcodone-acetaminophen (NORCO/VICODIN) 5-325 MG tablet Take 1 tablet by mouth at bedtime as needed.  . hydroxychloroquine (PLAQUENIL) 200 MG tablet Take 200 mg by mouth See admin instructions. 200 mg by mouth two times a day on Mon/Tues/Wed/Thurs/Fri only  . ibuprofen (ADVIL,MOTRIN) 400 MG tablet Take 1 tablet (400 mg total) by mouth every 6 (six) hours as needed.  . leflunomide (ARAVA) 20 MG tablet Take 20 mg by mouth daily.  [name] lidocaine (LIDODERM) 5 % Place 1 patch onto the skin daily. Remove & Discard patch within 12 hours or as directed by MD (Patient taking differently: Place 1 patch onto the  skin daily as needed (for pain). Remove & Discard patch within 12 hours or as directed by MD)  . metolazone (ZAROXOLYN) 2.5 MG tablet Take 2.5 mg by mouth daily as needed.  . metoprolol succinate (TOPROL-XL) 25 MG 24 hr tablet TAKE 1 TABLET BY MOUTH DAILY.  . Multiple Vitamin (MULTIVITAMIN WITH MINERALS) TABS tablet Take 1 tablet by mouth daily.  . NONFORMULARY OR COMPOUNDED ITEM Triamcinolone 0.1% & Silvadene cream 1:1- Apply as directed to affected areas  . potassium chloride (K-DUR) 10 MEQ tablet Take 6 tablets (60 mEq total) by mouth every morning AND 4 tablets (40 mEq total) daily with lunch AND 4 tablets (40 mEq total) every evening.  . Probiotic Product ([place]) 170 MG CAPS Take 1 capsule by mouth every morning.  . promethazine (PHENERGAN) 25 MG tablet TAKE 1 TABLET BY MOUTH EVERY 6 HOURS AS NEEDED.  [name] silver sulfADIAZINE (SILVADENE) 1 % cream Apply 1 application topically daily as needed (for skin infection/irritation.).   [name] spironolactone (ALDACTONE) 25 MG tablet Take 25 mg by mouth 2 (two) times daily.  [name] tiZANidine (ZANAFLEX) 4 MG tablet TAKE 1 TABLET BY MOUTH DAILY AT BEDTIME  . torsemide (DEMADEX) 20 MG tablet Take 3 tablets (60 mg total) by mouth 2 (two) times daily.  [name] UNABLE TO FIND CPAP: AT bedtime; setting is "12"  . Vitamin D, Ergocalciferol, (DRISDOL) 50000 units CAPS capsule Take 50,000 Units by mouth every Tuesday.     Allergies:  Allergies  Allergen Reactions  . Sulfa Antibiotics Hives and Itching    Flushing also  . Cymbalta [Duloxetine Hcl] Other (See Comments)    Caused a manic reaction  . Demerol Hives and Other (See Comments)    Fever also  . Ivp Dye [Iodinated Diagnostic Agents] Hives and Other (See Comments)    Fever also  . Morphine And Related Other (See Comments)    ineffective  . Sulfasalazine Other (See Comments)  Reaction ??  . Adhesive [Tape] Rash    Review of Systems:  A fourteen system review of systems was performed and  found to be positive as per HPI.   Objective:   Blood pressure 120/76, pulse 76, height '5\' 2"'$  (1.575 m), weight 237 lb 6.4 oz (107.7 kg), SpO2 97 %. Body mass index is 43.42 kg/m. General:  Obese body habitus.  Well Developed, well nourished, appropriate for stated age.  Neuro:  Alert and oriented,  extra-ocular muscles intact  HEENT:  Normocephalic, atraumatic, neck supple, no carotid bruits appreciated  Skin:  no gross rash, warm, pink. Cardiac:  Distant HS due to body habitus, but essentially RRR, S1 S2  Respiratory:  ECTA B/L and A/P, Not using accessory muscles, speaking in full sentences- unlabored. Vascular:  Ext warm, no cyanosis apprec.; cap RF less 2 sec. 2-3 pitting edema in RLE. 1-2 pitting edema on LLE.  No erythema or skin abnormality otherwise. Psych:  No HI/SI, judgement and insight good, Euthymic mood. Full Affect.

## 2017-11-20 NOTE — Telephone Encounter (Signed)
This patient canceled the same day of her appointment.

## 2017-11-21 ENCOUNTER — Telehealth: Payer: Self-pay | Admitting: Cardiology

## 2017-11-21 NOTE — Telephone Encounter (Signed)
New Message   Patient states that Dr. Danelle Earthly has released her back to Dr. Radford Pax. She states that she has not had any labs in over a month. She wants to know if Dr. Radford Pax needs to around labs for her. Please call to discuss.

## 2017-11-21 NOTE — Telephone Encounter (Signed)
I spoke with the patient she states that she has been discharged from Dr. Dianna Limbo care and instructed to f/u with Dr.Turner. Per last OV 08/30/17 on  patient is scheduled to f/u with Dr. Radford Pax in 6 months. Patient has f/u appt 02/26/18 and patient has an upcoming CMET scheduled with her primary MD to check potassium level. Informed patient to have labs faxed to Dr. Radford Pax. Patient verbalized understanding and thankful for the call

## 2017-11-26 ENCOUNTER — Other Ambulatory Visit (INDEPENDENT_AMBULATORY_CARE_PROVIDER_SITE_OTHER): Payer: PPO

## 2017-11-26 ENCOUNTER — Other Ambulatory Visit: Payer: Self-pay | Admitting: Rheumatology

## 2017-11-26 DIAGNOSIS — E782 Mixed hyperlipidemia: Secondary | ICD-10-CM | POA: Diagnosis not present

## 2017-11-26 DIAGNOSIS — Z8639 Personal history of other endocrine, nutritional and metabolic disease: Secondary | ICD-10-CM | POA: Diagnosis not present

## 2017-11-26 DIAGNOSIS — Z79899 Other long term (current) drug therapy: Secondary | ICD-10-CM | POA: Diagnosis not present

## 2017-11-26 DIAGNOSIS — E559 Vitamin D deficiency, unspecified: Secondary | ICD-10-CM | POA: Diagnosis not present

## 2017-11-26 DIAGNOSIS — R7302 Impaired glucose tolerance (oral): Secondary | ICD-10-CM

## 2017-11-26 DIAGNOSIS — R252 Cramp and spasm: Secondary | ICD-10-CM

## 2017-11-26 DIAGNOSIS — E876 Hypokalemia: Secondary | ICD-10-CM | POA: Diagnosis not present

## 2017-11-26 DIAGNOSIS — I5032 Chronic diastolic (congestive) heart failure: Secondary | ICD-10-CM | POA: Diagnosis not present

## 2017-11-26 DIAGNOSIS — M810 Age-related osteoporosis without current pathological fracture: Secondary | ICD-10-CM

## 2017-11-26 NOTE — Telephone Encounter (Signed)
Last Visit: 09/06/17 Next Visit: 02/05/18 Labs: 09/06/17 WNL  Okay to refill per Dr. Estanislado Pandy

## 2017-11-27 ENCOUNTER — Telehealth: Payer: Self-pay

## 2017-11-27 ENCOUNTER — Telehealth: Payer: Self-pay | Admitting: Rheumatology

## 2017-11-27 LAB — COMPREHENSIVE METABOLIC PANEL
ALBUMIN: 4.2 g/dL (ref 3.6–4.8)
ALK PHOS: 122 IU/L — AB (ref 39–117)
ALT: 17 IU/L (ref 0–32)
AST: 19 IU/L (ref 0–40)
Albumin/Globulin Ratio: 1.8 (ref 1.2–2.2)
BILIRUBIN TOTAL: 0.3 mg/dL (ref 0.0–1.2)
BUN/Creatinine Ratio: 17 (ref 12–28)
BUN: 15 mg/dL (ref 8–27)
CHLORIDE: 96 mmol/L (ref 96–106)
CO2: 28 mmol/L (ref 20–29)
CREATININE: 0.86 mg/dL (ref 0.57–1.00)
Calcium: 9.4 mg/dL (ref 8.7–10.3)
GFR calc Af Amer: 82 mL/min/{1.73_m2} (ref >59)
GFR calc non Af Amer: 71 mL/min/{1.73_m2} (ref >59)
GLOBULIN, TOTAL: 2.4 g/dL (ref 1.5–4.5)
GLUCOSE: 151 mg/dL — AB (ref 65–99)
Potassium: 3.3 mmol/L — ABNORMAL LOW (ref 3.5–5.2)
SODIUM: 139 mmol/L (ref 134–144)
Total Protein: 6.6 g/dL (ref 6.0–8.5)

## 2017-11-27 LAB — HEMOGLOBIN A1C
Est. average glucose Bld gHb Est-mCnc: 128 mg/dL
Hgb A1c MFr Bld: 6.1 % — ABNORMAL HIGH (ref 4.8–5.6)

## 2017-11-27 LAB — TSH: TSH: 1.2 u[IU]/mL (ref 0.450–4.500)

## 2017-11-27 LAB — LIPID PANEL
Chol/HDL Ratio: 3 ratio (ref 0.0–4.4)
Cholesterol, Total: 143 mg/dL (ref 100–199)
HDL: 47 mg/dL (ref >39)
LDL Calculated: 43 mg/dL (ref 0–99)
Triglycerides: 263 mg/dL — ABNORMAL HIGH (ref 0–149)
VLDL Cholesterol Cal: 53 mg/dL — ABNORMAL HIGH (ref 5–40)

## 2017-11-27 LAB — VITAMIN D 25 HYDROXY (VIT D DEFICIENCY, FRACTURES): VIT D 25 HYDROXY: 65.8 ng/mL (ref 30.0–100.0)

## 2017-11-27 LAB — CK TOTAL AND CKMB (NOT AT ARMC)
CK TOTAL: 57 U/L (ref 24–173)
CK-MB Index: 1.7 ng/mL (ref 0.0–5.3)

## 2017-11-27 LAB — T4, FREE: FREE T4: 0.97 ng/dL (ref 0.82–1.77)

## 2017-11-27 LAB — MAGNESIUM: Magnesium: 2.2 mg/dL (ref 1.6–2.3)

## 2017-11-27 LAB — PHOSPHORUS: PHOSPHORUS: 2.8 mg/dL (ref 2.5–4.5)

## 2017-11-27 NOTE — Telephone Encounter (Signed)
Rob from [employer] called stating they need the clinical diagnosis for patient's medication of Lidocaine patches.   Please call [phone] opt 0, opt 3, opt 1   Reference #[id number]

## 2017-11-27 NOTE — Telephone Encounter (Signed)
Received a prior authorization request for Lidocaine 5% patches from [facility]. Authorization was submitted to pts insurance via cover my meds. Will update once we have a response.   Zaliah Wissner, [place], CPhT 9:08 AM

## 2017-11-28 DIAGNOSIS — G4733 Obstructive sleep apnea (adult) (pediatric): Secondary | ICD-10-CM | POA: Diagnosis not present

## 2017-11-28 DIAGNOSIS — R269 Unspecified abnormalities of gait and mobility: Secondary | ICD-10-CM | POA: Diagnosis not present

## 2017-11-29 NOTE — Telephone Encounter (Signed)
Called [facility]. Spoke with Altha Harm who updated the pts profile for the diagnois for lidoderm patches. She has a history of diabetes but is being treated for RA, Fibromyalgia and DDD in our clinic. Will update once we have a response.   Ola Raap, [place], CPhT 9:56 AM

## 2017-12-03 NOTE — Telephone Encounter (Signed)
Received a fax from [facility] regarding a prior authorization DENIAL for lidocaine 5% patches.    Reference number: none  Phone number: [phone]  Will send document to scan center.  Called pt to update. Pt states she will pay out of pocket at her pharmacy as these are the only patches that help her with her pain.   Burnett Spray, [place], CPhT 11:06 AM

## 2017-12-05 ENCOUNTER — Other Ambulatory Visit: Payer: Self-pay | Admitting: Rheumatology

## 2017-12-05 NOTE — Telephone Encounter (Signed)
Last Visit: 09/06/17 Next Visit: 02/05/18 Labs: 09/06/17 WNL PLQ Eye Exam: 10/16/17 WNL   Okay to refill per Dr. Estanislado Pandy

## 2017-12-10 DIAGNOSIS — D22111 Melanocytic nevi of right upper eyelid, including canthus: Secondary | ICD-10-CM | POA: Diagnosis not present

## 2017-12-10 DIAGNOSIS — Z85828 Personal history of other malignant neoplasm of skin: Secondary | ICD-10-CM | POA: Diagnosis not present

## 2017-12-10 DIAGNOSIS — L821 Other seborrheic keratosis: Secondary | ICD-10-CM | POA: Diagnosis not present

## 2017-12-10 DIAGNOSIS — D1801 Hemangioma of skin and subcutaneous tissue: Secondary | ICD-10-CM | POA: Diagnosis not present

## 2017-12-17 ENCOUNTER — Telehealth: Payer: Self-pay | Admitting: Rheumatology

## 2017-12-17 NOTE — Telephone Encounter (Signed)
Patient is requesting an rx for Hydrocodone sent to [facility].

## 2017-12-18 MED ORDER — HYDROCODONE-ACETAMINOPHEN 5-325 MG PO TABS: 1.0000 | ORAL_TABLET | Freq: Every evening | ORAL | 0 refills | Status: DC | PRN

## 2017-12-18 NOTE — Telephone Encounter (Signed)
Last visit: 09/06/17 Next visit: 02/05/18 UDS: 09/06/17 Neg  Narc Agreement: 02/16/17  Last Fill: 09/06/17  Okay to refill Hydrocodone?

## 2018-01-21 ENCOUNTER — Other Ambulatory Visit: Payer: Self-pay | Admitting: Rheumatology

## 2018-01-21 MED ORDER — HYDROCODONE-ACETAMINOPHEN 5-325 MG PO TABS: 1.0000 | ORAL_TABLET | Freq: Every evening | ORAL | 0 refills | Status: DC | PRN

## 2018-01-21 NOTE — Telephone Encounter (Signed)
Last Visit: 09/06/17 Next Visit: 02/05/18  Okay to refill per Dr. Estanislado Pandy

## 2018-01-21 NOTE — Telephone Encounter (Signed)
Last visit: 09/06/17 Next visit: 02/05/18 UDS: 09/06/17 Neg  Narc Agreement: 02/16/17  Last Fill: 12/18/17  Okay to refill Hydrocodone? Patient states she would also like to get a prescription of Prednisone for her flair-up.

## 2018-01-21 NOTE — Telephone Encounter (Signed)
Patient left a voicemail requesting prescription refill of Hydrocodone.  Patient states she would also like to get a prescription of Prednisone for her flair-up.

## 2018-01-22 NOTE — Progress Notes (Addendum)
Office Visit Note  Patient: [name]             Date of Birth: 1950-11-28           MRN: [id number]             PCP: Mellody Dance, DO Referring: Mellody Dance, DO Visit Date: 02/05/2018 Occupation: @GUAROCC @    Subjective:  Right hip and right foot pain.   History of Present Illness: [name] is a 67 y.o. female with seropositive rheumatoid arthritis, Sjogren's and osteoarthritis.  She states about a week ago she fell and landed up on her right hip joint.  After that she went for a beach trip.  She states she did not go for evaluation and she continues to have pain and discomfort in her right hip.  She continues to have some discomfort in her right knee joint.  She also has been having some discomfort in her right foot.  She has bilateral lower extremity pedal edema due to underlying congestive heart failure.  Her sicca symptoms are tolerable.  She has been taking diuretics which makes it worse.  Right total knee replacement is doing okay.  Her fibromyalgia symptoms are controlled.  Patient reports that she used some CBD oil recently at her beach trip.  Activities of Daily Living:  Patient reports morning stiffness for 30 minutes.   Patient Reports nocturnal pain.  Difficulty dressing/grooming: Reports Difficulty climbing stairs: Reports Difficulty getting out of chair: Reports Difficulty using hands for taps, buttons, cutlery, and/or writing: Reports   Review of Systems  Constitutional: Negative for fatigue, night sweats, weight gain and weight loss.  HENT: Positive for mouth dryness. Negative for mouth sores, trouble swallowing, trouble swallowing and nose dryness.   Eyes: Positive for dryness. Negative for pain, redness and visual disturbance.  Respiratory: Negative for cough, shortness of breath and difficulty breathing.   Cardiovascular: Negative for chest pain, palpitations, hypertension, irregular heartbeat and swelling in legs/feet.  Gastrointestinal:  Positive for diarrhea. Negative for abdominal pain, blood in stool and constipation.       History of IBS  Endocrine: Negative for increased urination.  Genitourinary: Negative for pelvic pain and vaginal dryness.  Musculoskeletal: Positive for arthralgias, joint pain, joint swelling and morning stiffness. Negative for myalgias, muscle weakness, muscle tenderness and myalgias.  Skin: Negative for color change, rash, hair loss, skin tightness, ulcers and sensitivity to sunlight.  Allergic/Immunologic: Negative for susceptible to infections.  Neurological: Negative for dizziness, light-headedness, headaches, memory loss, night sweats and weakness.  Hematological: Negative for bruising/bleeding tendency and swollen glands.  Psychiatric/Behavioral: Negative for depressed mood, confusion and sleep disturbance. The patient is not nervous/anxious.     PMFS History:  Patient Active Problem List   Diagnosis Date Noted  . Vitamin D deficiency 11/20/2017  . Mixed hyperlipidemia 11/20/2017  . Muscle cramps 11/20/2017  . Family history of coronary arteriosclerosis- strong fam h/o CAD and early CAD.  07/17/2017  . Raynaud's disease without gangrene 03/12/2017  . Abnormal weight gain 02/06/2017  . Shingles outbreak 02/06/2017  . Cyst (solitary) of breast, left 01/31/2017  . Eosinophilic esophagitis 02/72/5366  . History of hyperlipidemia 10/04/2016  . 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 History of diabetes mellitus 09/09/2016  . History of diverticulitis 09/03/2016  . Osteoporosis 09/03/2016  . Autoimmune disease ([place]) 09/02/2016  . High risk medication use 09/02/2016  . History of esophagitis 09/02/2016  . Elevated triglycerides with high cholesterol 08/23/2016  . Low serum HDL  08/23/2016  . Breast cyst, left 06/14/2016  . Encounter for wellness examination 05/23/2016  . Abnormality of gait 05/09/2016  . Hypokalemia 03/22/2016  . Sjoegren syndrome 02/29/2016  . On potassium wasting  diuretic therapy 02/29/2016  . GERD (gastroesophageal reflux disease) 01/19/2016  . Glucose intolerance (impaired glucose tolerance) 01/19/2016  . Chronic diastolic CHF (congestive heart failure) ([place]) 04/20/2015  . Peripheral polyneuropathy 02/02/2014  . Morbid obesity ([place]) 11/17/2013  . Heart murmur   . OSA (obstructive sleep apnea)   . Rheumatoid arthritis ([place])   . Hiatal hernia   . Fibromyalgia   . PVC's (premature ventricular contractions)   . 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 Unilateral primary osteoarthritis, left knee 06/22/2011    Past Medical History:  Diagnosis Date  . Arthritis    Rheumatoid arthritis,   . Blood transfusion    1981  . Chronic diastolic CHF (congestive heart failure) (HCC)    diastolic   . Fibromyalgia   . Heart murmur    as a child  . Hiatal hernia    sjorgens syndrome  . OSA (obstructive sleep apnea)   . Peripheral autonomic neuropathy of unknown cause     Family History  Problem Relation Age of Onset  . Alzheimer's disease Mother   . Heart attack Mother   . Hypertension Mother   . Glaucoma Mother   . Cancer Father   . Heart attack Brother   . Heart disease Brother   . Glaucoma Brother   . Hyperlipidemia Brother   . Glaucoma Brother    Past Surgical History:  Procedure Laterality Date  . ABDOMINAL HYSTERECTOMY     BTL, BSO  . BREAST SURGERY     mass removal   . CHOLECYSTECTOMY    . DIAGNOSTIC LAPAROSCOPY     x3  . KNEE ARTHROSCOPY     x2  . MASS EXCISION Left 03/22/2017   Procedure: EXCISION OF LEFT BREAST MASS;  Surgeon: Donnie Mesa, MD;  Location: [facility];  Service: General;  Laterality: Left;  . patotid cystectomy    . TENDON REPAIR  1980   left ankle and tibia  . TONSILLECTOMY    . TOTAL KNEE ARTHROPLASTY  06/21/2011   Procedure: TOTAL KNEE ARTHROPLASTY;  Surgeon: Gearlean Alf;  Location: [facility];  Service: Orthopedics;  Laterality: Right;  . TUBAL LIGATION     Social History   Social History  Narrative   Lives at home alone   Right-handed   Drinks 1 or less cups of coffee and 2 servings of either tea or soda per day     Objective: Vital Signs: BP 120/78 (BP Location: Left Arm, Patient Position: Sitting, Cuff Size: Large)   Pulse 77   Resp 17   Ht 5\' 2"  (1.575 m)   Wt 246 lb (111.6 kg)   BMI 44.99 kg/m    Physical Exam  Constitutional: She is oriented to person, place, and time. She appears well-developed and well-nourished.  HENT:  Head: Normocephalic and atraumatic.  Eyes: Conjunctivae and EOM are normal.  Neck: Normal range of motion.  Cardiovascular: Normal rate, regular rhythm, normal heart sounds and intact distal pulses.  Pulmonary/Chest: Effort normal and breath sounds normal.  Abdominal: Soft. Bowel sounds are normal.  Lymphadenopathy:    She has no cervical adenopathy.  Neurological: She is alert and oriented to person, place, and time.  Skin: Skin is warm and dry. Capillary refill takes less than 2 seconds.  Psychiatric: She has  a normal mood and affect. Her behavior is normal.  Nursing note and vitals reviewed.    Musculoskeletal Exam: C-spine good range of motion.  She has thoracic kyphosis.  Lumbar spine range of motion was difficult to assess.  Shoulder joints elbow joints wrist joints in good range of motion.  She has some synovitis over MCPs and PIPs as described below.  She has tenderness over right gluteal and over the oximeter one third of her femur.  Right total knee replacement is doing well.  She has some crepitus in her left knee.  She had pedal edema over bilateral lower extremity without synovitis.  CDAI Exam: CDAI Homunculus Exam:   Tenderness:  Right hand: 2nd MCP and 3rd PIP  Swelling:  Right hand: 3rd MCP and 3rd PIP  Joint Counts:  CDAI Tender Joint count: 2 CDAI Swollen Joint count: 2  Global Assessments:  Patient Global Assessment: 5 Provider Global Assessment: 5  CDAI Calculated Score: 14    Investigation: No  additional findings.PLQ eye exam: 10/16/2017 CBC Latest Ref Rng & Units 09/26/2017 09/06/2017 07/02/2017  WBC 4.0 - 10.5 K/uL 11.0(H) 7.9 6.3  Hemoglobin 12.0 - 15.0 g/dL 14.5 13.7 13.4  Hematocrit 36.0 - 46.0 % 43.5 40.1 38.9  Platelets 150 - 400 K/uL 198 213 176   CMP Latest Ref Rng & Units 11/26/2017 10/11/2017 09/26/2017  Glucose 65 - 99 mg/dL 151(H) 126(H) 119(H)  BUN 8 - 27 mg/dL 15 18 17   Creatinine 0.57 - 1.00 mg/dL 0.86 1.08(H) 1.14(H)  Sodium 134 - 144 mmol/L 139 138 140  Potassium 3.5 - 5.2 mmol/L 3.3(L) 3.8 4.1  Chloride 96 - 106 mmol/L 96 94(L) 101  CO2 20 - 29 mmol/L 28 27 24   Calcium 8.7 - 10.3 mg/dL 9.4 9.3 9.1  Total Protein 6.0 - 8.5 g/dL 6.6 - -  Total Bilirubin 0.0 - 1.2 mg/dL 0.3 - -  Alkaline Phos 39 - 117 IU/L 122(H) - -  AST 0 - 40 IU/L 19 - -  ALT 0 - 32 IU/L 17 - -    Imaging: No results found.  Speciality Comments: PLQ Eye Exam: 10/16/17 WNL @ [facility] follow up in 1 year    Procedures:  No procedures performed Allergies: Sulfa antibiotics; Cymbalta [duloxetine hcl]; Demerol; Ivp dye [iodinated diagnostic agents]; Morphine and related; Sulfasalazine; and Adhesive [tape]   Assessment / Plan:     Visit Diagnoses: Rheumatoid arthritis involving multiple sites with positive rheumatoid factor (HCC) - Positive RF, positive ANA she is having a flare of rheumatoid arthritis as she cannot afford Biologics.  She had recent prednisone taper.  She states she does well when she is on low-dose prednisone.  She would like to try low-dose prednisone along with her current DMARDs.  We had detailed discussion regarding side effects of prednisone.  Plan is to start her on prednisone 4 mg p.o. daily.  She will taper the dose to the least tolerable dose where her rheumatoid arthritis is controlled.  Due to chronic pain and discomfort she does take hydrocodone intermittently.  Recent fall-patient states that she tripped on her shoes as she was not wearing proper fitting  shoes.  She has been having increased pain and discomfort in her right pelvis and right hip area.  I will obtain x-ray of her right pelvis and right femur to rule out fracture.  Pain management-she is on hydrocodone which she takes on PRN basis only.  She states in the last week she is taking it  on a regular basis due to severe pain and discomfort from the fall.  We had detailed discussion regarding the risk of falling and the side effects of hydrocodone.  She believes the fall was secondary to wearing and proper fitting shoes.  She states while she is on prednisone she does not require hydrocodone and she would like to go on prednisone.  She also reported that she has recently used some CBD oil.  I discouraged the use of CBD oil.  High risk medication use - Arava 20 mg po qd, PLQ 200, hydrocodone (Orencia IV discontinued due to cost), SSZ- reaction. UDS: 09/06/2017 Negative. Narc agreement: 02/16/2017 - Plan: Pain Mgmt, Profile 5 w/Conf, U, CBC with Differential/Platelet, COMPLETE METABOLIC PANEL WITH GFR today and then every 3 months.  Sjogren's syndrome with keratoconjunctivitis sicca (HCC)-her sicca symptoms are manageable.  Raynaud's disease without gangrene-currently not active.  Unilateral primary osteoarthritis, left knee-chronic pain  History of total knee replacement, right - 2012.  Fibromyalgia-she continues to have some generalized pain.  Although she believes her fibromyalgia is not that active.  Age-related osteoporosis without current pathological fracture - Status post Forteo. Treated with Reclast infusions in the past which were discontinued due to the cost. DXA 01/2017 showed T score of -1.8 right femoral neck   DDD (degenerative disc disease), lumbar-chronic pain  History of peripheral neuropathy-she has been followed up by Dr. Jannifer Franklin.  History of depression  Eosinophilic esophagitis - history of and and Barretts esophagus  - Dr Collene Mares   History of diverticulitis  History of  cardiac murmur - history of PVC's  History of diabetes mellitus  History of hyperlipidemia    Orders: Orders Placed This Encounter  Procedures  . DG Pelvis 1-2 Views  . DG FEMUR, MIN 2 VIEWS RIGHT  . Pain Mgmt, Profile 5 w/Conf, U  . CBC with Differential/Platelet  . COMPLETE METABOLIC PANEL WITH GFR   Meds ordered this encounter  Medications  . predniSONE (DELTASONE) 1 MG tablet    Sig: Take 4 tablets (4 mg total) by mouth daily with breakfast.    Dispense:  120 tablet    Refill:  2    Face-to-face time spent with patient was 30 minutes.. 50% of time was spent in counseling and coordination of care.  Follow-Up Instructions: Return in about 5 months (around 07/08/2018) for Rheumatoid arthritis, Osteoarthritis.   Bo Merino, MD  Note - This record has been created using Editor, commissioning.  Chart creation errors have been sought, but may not always  have been located. Such creation errors do not reflect on  the standard of medical care.

## 2018-01-24 ENCOUNTER — Telehealth: Payer: Self-pay | Admitting: *Deleted

## 2018-01-24 MED ORDER — PREDNISONE 5 MG PO TABS: ORAL_TABLET | ORAL | 0 refills | Status: DC

## 2018-01-24 NOTE — Telephone Encounter (Signed)
Patient states she is having pain in fingers, wrists hips and back, knees are aching. Patient states she is having swelling in her fingers. Patient is requesting a prescription on Prednisone. Per Dr. Estanislado Pandy okay to send Prednisone taper.

## 2018-01-24 NOTE — Telephone Encounter (Signed)
Patient states she is having pain in fingers, wrists hips and back, knees are aching. Patient states she is having swelling in her fingers. Patient is requesting a prescription on Prednisone.

## 2018-02-05 ENCOUNTER — Encounter: Payer: Self-pay | Admitting: Neurology

## 2018-02-05 ENCOUNTER — Encounter: Payer: Self-pay | Admitting: Rheumatology

## 2018-02-05 ENCOUNTER — Ambulatory Visit: Payer: PPO | Admitting: Rheumatology

## 2018-02-05 ENCOUNTER — Ambulatory Visit (HOSPITAL_COMMUNITY)
Admission: RE | Admit: 2018-02-05 | Discharge: 2018-02-05 | Disposition: A | Discharge status: Home / Self Care | Payer: PPO | Source: Ambulatory Visit | Attending: Rheumatology | Admitting: Rheumatology

## 2018-02-05 ENCOUNTER — Ambulatory Visit: Payer: PPO | Admitting: Neurology

## 2018-02-05 VITALS — BP 115/67 | HR 74 | Ht 62.0 in | Wt 243.0 lb

## 2018-02-05 VITALS — BP 120/78 | HR 77 | Resp 17 | Ht 62.0 in | Wt 246.0 lb

## 2018-02-05 DIAGNOSIS — M0579 Rheumatoid arthritis with rheumatoid factor of multiple sites without organ or systems involvement: Secondary | ICD-10-CM

## 2018-02-05 DIAGNOSIS — R102 Pelvic and perineal pain: Secondary | ICD-10-CM

## 2018-02-05 DIAGNOSIS — Z96651 Presence of right artificial knee joint: Secondary | ICD-10-CM | POA: Diagnosis not present

## 2018-02-05 DIAGNOSIS — M797 Fibromyalgia: Secondary | ICD-10-CM

## 2018-02-05 DIAGNOSIS — Z8679 Personal history of other diseases of the circulatory system: Secondary | ICD-10-CM

## 2018-02-05 DIAGNOSIS — Z9181 History of falling: Secondary | ICD-10-CM | POA: Diagnosis not present

## 2018-02-05 DIAGNOSIS — K200 Eosinophilic esophagitis: Secondary | ICD-10-CM | POA: Diagnosis not present

## 2018-02-05 DIAGNOSIS — M3501 Sicca syndrome with keratoconjunctivitis: Secondary | ICD-10-CM | POA: Diagnosis not present

## 2018-02-05 DIAGNOSIS — G629 Polyneuropathy, unspecified: Secondary | ICD-10-CM

## 2018-02-05 DIAGNOSIS — I7300 Raynaud's syndrome without gangrene: Secondary | ICD-10-CM | POA: Diagnosis not present

## 2018-02-05 DIAGNOSIS — M1712 Unilateral primary osteoarthritis, left knee: Secondary | ICD-10-CM

## 2018-02-05 DIAGNOSIS — Z8719 Personal history of other diseases of the digestive system: Secondary | ICD-10-CM

## 2018-02-05 DIAGNOSIS — Z8639 Personal history of other endocrine, nutritional and metabolic disease: Secondary | ICD-10-CM

## 2018-02-05 DIAGNOSIS — Z8659 Personal history of other mental and behavioral disorders: Secondary | ICD-10-CM

## 2018-02-05 DIAGNOSIS — M810 Age-related osteoporosis without current pathological fracture: Secondary | ICD-10-CM | POA: Diagnosis not present

## 2018-02-05 DIAGNOSIS — S79921A Unspecified injury of right thigh, initial encounter: Secondary | ICD-10-CM | POA: Diagnosis not present

## 2018-02-05 DIAGNOSIS — S3993XA Unspecified injury of pelvis, initial encounter: Secondary | ICD-10-CM | POA: Diagnosis not present

## 2018-02-05 DIAGNOSIS — M5136 Other intervertebral disc degeneration, lumbar region: Secondary | ICD-10-CM | POA: Diagnosis not present

## 2018-02-05 DIAGNOSIS — Z8669 Personal history of other diseases of the nervous system and sense organs: Secondary | ICD-10-CM | POA: Diagnosis not present

## 2018-02-05 DIAGNOSIS — M79651 Pain in right thigh: Secondary | ICD-10-CM | POA: Diagnosis not present

## 2018-02-05 DIAGNOSIS — M51369 Other intervertebral disc degeneration, lumbar region without mention of lumbar back pain or lower extremity pain: Secondary | ICD-10-CM

## 2018-02-05 DIAGNOSIS — Z79899 Other long term (current) drug therapy: Secondary | ICD-10-CM | POA: Diagnosis not present

## 2018-02-05 MED ORDER — PREDNISONE 1 MG PO TABS: 4.0000 mg | ORAL_TABLET | Freq: Every day | ORAL | 2 refills | Status: DC

## 2018-02-05 NOTE — Patient Instructions (Signed)
Standing Labs We placed an order today for your standing lab work.    Please come back and get your standing labs in September and every 3 months   We have open lab Monday through Friday from 8:30-11:30 AM and 1:30-4:00 PM  at the office of Dr. Takayla Baillie.   You may experience shorter wait times on Monday and Friday afternoons. The office is located at [address], [address], [place], [place] [address] No appointment is necessary.   Labs are drawn by [facility].  You may receive a bill from [facility] for your lab work. If you have any questions regarding directions or hours of operation,  please call [phone].    

## 2018-02-05 NOTE — Progress Notes (Signed)
Reason for visit: Peripheral neuropathy  [name] is an 67 y.o. female  History of present illness:  [name] is a 67 year old right-handed white female with a history of morbid obesity and rheumatoid arthritis as well as a peripheral neuropathy.  The patient has chronic low back pain as well.  She has had lancinating pains with her neuropathy that has responded quite well to the combination of gabapentin and carbamazepine.  The patient takes 200 mg of carbamazepine at night, she is on gabapentin taking 600 mg in the morning and 900 mg in the evening.  She did have one fall on 28 January 2018.  The patient has had some right lower back pain since that time.  The patient has been sleeping fairly well.  She does not use a cane or other assistive device for ambulation.  She returns for an evaluation.  She has had recent blood work done in April 2019.  Past Medical History:  Diagnosis Date  . Arthritis    Rheumatoid arthritis,   . Blood transfusion    1981  . Chronic diastolic CHF (congestive heart failure) (HCC)    diastolic   . Fibromyalgia   . Heart murmur    as a child  . Hiatal hernia    sjorgens syndrome  . OSA (obstructive sleep apnea)   . Peripheral autonomic neuropathy of unknown cause     Past Surgical History:  Procedure Laterality Date  . ABDOMINAL HYSTERECTOMY     BTL, BSO  . BREAST SURGERY     mass removal   . CHOLECYSTECTOMY    . DIAGNOSTIC LAPAROSCOPY     x3  . KNEE ARTHROSCOPY     x2  . MASS EXCISION Left 03/22/2017   Procedure: EXCISION OF LEFT BREAST MASS;  Surgeon: Donnie Mesa, MD;  Location: [facility];  Service: General;  Laterality: Left;  . patotid cystectomy    . TENDON REPAIR  1980   left ankle and tibia  . TONSILLECTOMY    . TOTAL KNEE ARTHROPLASTY  06/21/2011   Procedure: TOTAL KNEE ARTHROPLASTY;  Surgeon: Gearlean Alf;  Location: [facility];  Service: Orthopedics;  Laterality: Right;  . TUBAL LIGATION      Family History  Problem Relation  Age of Onset  . Alzheimer's disease Mother   . Heart attack Mother   . Hypertension Mother   . Glaucoma Mother   . Cancer Father   . Heart attack Brother   . Heart disease Brother   . Glaucoma Brother   . Hyperlipidemia Brother   . Glaucoma Brother     Social history:  reports that she has never smoked. She has never used smokeless tobacco. She reports that she drinks alcohol. She reports that she does not use drugs.    Allergies  Allergen Reactions  . Sulfa Antibiotics Hives and Itching    Flushing also  . Cymbalta [Duloxetine Hcl] Other (See Comments)    Caused a manic reaction  . Demerol Hives and Other (See Comments)    Fever also  . Ivp Dye [Iodinated Diagnostic Agents] Hives and Other (See Comments)    Fever also  . Morphine And Related Other (See Comments)    ineffective  . Sulfasalazine Other (See Comments)    Reaction ??  . Adhesive [Tape] Rash    Medications:  Prior to Admission medications   Medication Sig Start Date End Date Taking? Authorizing Provider  aspirin EC 81 MG tablet Take 81 mg by  mouth at bedtime.   Yes [provider]  atorvastatin (LIPITOR) 20 MG tablet Take 1 tablet (20 mg total) by mouth daily. 07/17/17  Yes Opalski, Neoma Laming, DO  carbamazepine (TEGRETOL) 200 MG tablet Take 200 mg by mouth at bedtime.   Yes [provider]  cycloSPORINE (RESTASIS) 0.05 % ophthalmic emulsion Place 2 drops into both eyes 2 (two) times daily.    Yes [provider]  diphenoxylate-atropine (LOMOTIL) 2.5-0.025 MG per tablet Take 1 tablet by mouth 4 (four) times daily as needed for diarrhea or loose stools.   Yes [provider]  gabapentin (NEURONTIN) 300 MG capsule Take 600-900 mg by mouth See admin instructions. 600 mg by mouth in the morning and 900 mg at bedtime   Yes [provider]  HYDROcodone-acetaminophen (NORCO/VICODIN) 5-325 MG tablet Take 1 tablet by mouth at bedtime as needed. 01/21/18  Yes Ofilia Neas, PA-C    hydroxychloroquine (PLAQUENIL) 200 MG tablet Take 200 mg by mouth See admin instructions. 200 mg by mouth two times a day on Mon/Tues/Wed/Thurs/Fri only 06/20/17  Yes [provider]  ibuprofen (ADVIL,MOTRIN) 400 MG tablet Take 1 tablet (400 mg total) by mouth every 6 (six) hours as needed. 09/27/17  Yes Dorie Rank, MD  leflunomide (ARAVA) 20 MG tablet Take 20 mg by mouth daily.   Yes [provider]  lidocaine (LIDODERM) 5 % Place 1 patch onto the skin daily as needed (for pain). Remove & Discard patch within 12 hours or as directed by MD 11/26/17  Yes Deveshwar, Abel Presto, MD  metolazone (ZAROXOLYN) 2.5 MG tablet Take 2.5 mg by mouth daily as needed.   Yes [provider]  metoprolol succinate (TOPROL-XL) 25 MG 24 hr tablet TAKE 1 TABLET BY MOUTH DAILY. 10/24/17  Yes Turner, Eber Hong, MD  Multiple Vitamin (MULTIVITAMIN WITH MINERALS) TABS tablet Take 1 tablet by mouth daily.   Yes [provider]  NONFORMULARY OR COMPOUNDED ITEM Triamcinolone 0.1% & Silvadene cream 1:1- Apply as directed to affected areas   Yes [provider]  potassium chloride (K-DUR) 10 MEQ tablet Take 6 tablets (60 mEq total) by mouth every morning AND 4 tablets (40 mEq total) daily with lunch AND 4 tablets (40 mEq total) every evening. 09/05/17 09/05/18 Yes Turner, Eber Hong, MD  Probiotic Product ([place]) 170 MG CAPS Take 1 capsule by mouth every morning.   Yes [provider]  promethazine (PHENERGAN) 25 MG tablet TAKE 1 TABLET BY MOUTH EVERY 6 HOURS AS NEEDED. 01/09/17  Yes Deveshwar, Abel Presto, MD  silver sulfADIAZINE (SILVADENE) 1 % cream Apply 1 application topically daily as needed (for skin infection/irritation.).    Yes [provider]  spironolactone (ALDACTONE) 25 MG tablet Take 25 mg by mouth 2 (two) times daily.   Yes [provider]  tiZANidine (ZANAFLEX) 4 MG tablet TAKE 1 TABLET BY MOUTH DAILY AT BEDTIME 01/21/18  Yes Deveshwar, Abel Presto, MD   torsemide (DEMADEX) 20 MG tablet Take 3 tablets (60 mg total) by mouth 2 (two) times daily. 08/30/17  Yes Turner, Eber Hong, MD  UNABLE TO FIND CPAP: AT bedtime; setting is "12"   Yes [provider]  Vitamin D, Ergocalciferol, (DRISDOL) 50000 units CAPS capsule Take 50,000 Units by mouth every Tuesday.    Yes [provider]    ROS:  Out of a complete 14 system review of symptoms, the patient complains only of the following symptoms, and all other reviewed systems are negative.  Runny nose Swollen  abdomen, diarrhea, irritable bowel syndrome Sleep apnea Incontinence of the bladder Numbness, weakness  Blood pressure 115/67, pulse 74, height 5\' 2"  (1.575 m), weight 243 lb (110.2 kg).  Physical Exam  General: The patient is alert and cooperative at the time of the examination.  The patient is morbidly obese.  Skin: 3+ edema below the knees is seen bilaterally..   Neurologic Exam  Mental status: The patient is alert and oriented x 3 at the time of the examination. The patient has apparent normal recent and remote memory, with an apparently normal attention span and concentration ability.   Cranial nerves: Facial symmetry is present. Speech is normal, no aphasia or dysarthria is noted. Extraocular movements are full. Visual fields are full.  Motor: The patient has good strength in all 4 extremities.  Sensory examination: Soft touch sensation is symmetric on the face, arms, and legs.  Coordination: The patient has good finger-nose-finger and heel-to-shin bilaterally.  Gait and station: The patient has a slightly wide-based gait, the patient can walk independently.  Tandem gait was not attempted.  Romberg is negative. No drift is seen.  Reflexes: Deep tendon reflexes are symmetric, but are depressed.   Assessment/Plan:  1.  Chronic low back pain  2.  Peripheral neuropathy  3.  Gait disorder  The patient will continue her gabapentin and carbamazepine, she  seems to be doing quite well with this.  She will call if she needs refills, she will otherwise follow-up in 6 months.  Jill Alexanders MD 02/05/2018 11:09 AM  [facility] [address] [place], [place] [address]  Phone [phone] Fax [phone]

## 2018-02-06 NOTE — Progress Notes (Signed)
X-ray did not show any fracture.

## 2018-02-06 NOTE — Progress Notes (Signed)
Please notify patient the pelvis and femur x-ray did not show any fracture.

## 2018-02-08 LAB — PAIN MGMT, PROFILE 5 W/CONF, U
AMPHETAMINES: NEGATIVE ng/mL (ref <500)
BENZODIAZEPINES: NEGATIVE ng/mL (ref <100)
Barbiturates: NEGATIVE ng/mL (ref <300)
Cocaine Metabolite: NEGATIVE ng/mL (ref <150)
Codeine: NEGATIVE ng/mL (ref <50)
Creatinine: 142 mg/dL
Hydrocodone: 578 ng/mL — ABNORMAL HIGH (ref <50)
Hydromorphone: 200 ng/mL — ABNORMAL HIGH (ref <50)
MARIJUANA METABOLITE: POSITIVE ng/mL — AB (ref <20)
METHADONE METABOLITE: NEGATIVE ng/mL (ref <100)
MORPHINE: NEGATIVE ng/mL (ref <50)
Marijuana Metabolite: 5 ng/mL — ABNORMAL HIGH (ref <5)
Norhydrocodone: 828 ng/mL — ABNORMAL HIGH (ref <50)
OPIATES: POSITIVE ng/mL — AB (ref <100)
OXIDANT: NEGATIVE ug/mL (ref <200)
Oxycodone: NEGATIVE ng/mL (ref <100)
pH: 6.27 (ref 4.5–9.0)

## 2018-02-08 LAB — COMPLETE METABOLIC PANEL WITH GFR
AG Ratio: 1.7 (calc) (ref 1.0–2.5)
ALBUMIN MSPROF: 3.9 g/dL (ref 3.6–5.1)
ALKALINE PHOSPHATASE (APISO): 95 U/L (ref 33–130)
ALT: 16 U/L (ref 6–29)
AST: 18 U/L (ref 10–35)
BILIRUBIN TOTAL: 0.4 mg/dL (ref 0.2–1.2)
BUN: 16 mg/dL (ref 7–25)
CHLORIDE: 107 mmol/L (ref 98–110)
CO2: 27 mmol/L (ref 20–32)
CREATININE: 0.79 mg/dL (ref 0.50–0.99)
Calcium: 9.4 mg/dL (ref 8.6–10.4)
GFR, Est African American: 91 mL/min/{1.73_m2} (ref >=60)
GFR, Est Non African American: 79 mL/min/{1.73_m2} (ref >=60)
GLUCOSE: 157 mg/dL — AB (ref 65–99)
Globulin: 2.3 g/dL (calc) (ref 1.9–3.7)
Potassium: 3.8 mmol/L (ref 3.5–5.3)
Sodium: 143 mmol/L (ref 135–146)
TOTAL PROTEIN: 6.2 g/dL (ref 6.1–8.1)

## 2018-02-08 LAB — CBC WITH DIFFERENTIAL/PLATELET
BASOS PCT: 0.8 %
Basophils Absolute: 54 cells/uL (ref 0–200)
EOS PCT: 1.5 %
Eosinophils Absolute: 101 cells/uL (ref 15–500)
HCT: 39.3 % (ref 35.0–45.0)
HEMOGLOBIN: 13.2 g/dL (ref 11.7–15.5)
Lymphs Abs: 1226 cells/uL (ref 850–3900)
MCH: 28.9 pg (ref 27.0–33.0)
MCHC: 33.6 g/dL (ref 32.0–36.0)
MCV: 86.2 fL (ref 80.0–100.0)
MPV: 11.7 fL (ref 7.5–12.5)
Monocytes Relative: 7.2 %
NEUTROS ABS: 4837 {cells}/uL (ref 1500–7800)
Neutrophils Relative %: 72.2 %
PLATELETS: 189 10*3/uL (ref 140–400)
RBC: 4.56 10*6/uL (ref 3.80–5.10)
RDW: 13.1 % (ref 11.0–15.0)
TOTAL LYMPHOCYTE: 18.3 %
WBC mixed population: 482 cells/uL (ref 200–950)
WBC: 6.7 10*3/uL (ref 3.8–10.8)

## 2018-02-08 NOTE — Progress Notes (Signed)
Please notify patient that her UDS was positive for marijuana.  Patient informed me during the last visit that she used CBD oil.  She will not use CBD oil in future.  It is okay to refill her hydrocodone for right now.

## 2018-02-18 ENCOUNTER — Other Ambulatory Visit: Payer: Self-pay | Admitting: Rheumatology

## 2018-02-19 NOTE — Telephone Encounter (Signed)
Last Visit: 02/05/18 Next Visit: 07/16/18 Labs: 02/05/18 Glucose is 157. All other labs are WNL.  Okay to refill per Dr. Estanislado Pandy

## 2018-02-21 ENCOUNTER — Other Ambulatory Visit: Payer: Self-pay | Admitting: Neurology

## 2018-02-21 ENCOUNTER — Other Ambulatory Visit: Payer: Self-pay | Admitting: Rheumatology

## 2018-02-22 ENCOUNTER — Other Ambulatory Visit: Payer: Self-pay | Admitting: *Deleted

## 2018-02-22 MED ORDER — CARBAMAZEPINE 200 MG PO TABS: 200.0000 mg | ORAL_TABLET | Freq: Every day | ORAL | 1 refills | Status: DC

## 2018-02-22 NOTE — Telephone Encounter (Signed)
Last Visit: 02/05/18 Next Visit: 07/16/18  Okay to refill per Dr. Estanislado Pandy

## 2018-02-26 ENCOUNTER — Ambulatory Visit: Payer: PPO | Admitting: Cardiology

## 2018-03-04 ENCOUNTER — Other Ambulatory Visit: Payer: Self-pay | Admitting: Rheumatology

## 2018-03-04 DIAGNOSIS — G4733 Obstructive sleep apnea (adult) (pediatric): Secondary | ICD-10-CM | POA: Diagnosis not present

## 2018-03-04 DIAGNOSIS — R269 Unspecified abnormalities of gait and mobility: Secondary | ICD-10-CM | POA: Diagnosis not present

## 2018-03-04 NOTE — Telephone Encounter (Signed)
Last Visit: 02/05/18 Next Visit: 07/16/18 Labs: 02/05/18 Glucose is 157. All other labs are WNL. PLQ Eye Exam: 10/16/17 WNL  Okay to refill per Dr. Estanislado Pandy

## 2018-03-08 ENCOUNTER — Telehealth: Payer: Self-pay | Admitting: *Deleted

## 2018-03-08 NOTE — Telephone Encounter (Signed)
Informed patient of compliance results and patient understanding was verbalized. Patient is aware and agreeable to AHI being within range at 3.7. Patient is aware and agreeable to being in compliance with machine usage. Patient is aware and agreeable to no change in current pressures.

## 2018-03-08 NOTE — Telephone Encounter (Signed)
-----   Message from Sueanne Margarita, MD sent at 03/03/2018 12:25 PM EDT ----- Good AHI and compliance.  Continue current PAP settings.

## 2018-03-20 ENCOUNTER — Other Ambulatory Visit: Payer: Self-pay | Admitting: Physician Assistant

## 2018-03-20 ENCOUNTER — Other Ambulatory Visit: Payer: Self-pay | Admitting: Neurology

## 2018-03-20 NOTE — Telephone Encounter (Signed)
Last Visit: 02/05/18 Next visit: 07/16/18 UDS: 02/05/18 Narc Agreement: 02/05/18  Last Fill: 01/21/18  Okay to refill Hydrocodone?

## 2018-03-21 ENCOUNTER — Encounter: Payer: Self-pay | Admitting: Family Medicine

## 2018-03-21 ENCOUNTER — Ambulatory Visit (INDEPENDENT_AMBULATORY_CARE_PROVIDER_SITE_OTHER): Payer: PPO | Admitting: Family Medicine

## 2018-03-21 VITALS — BP 112/63 | HR 95 | Ht 62.0 in | Wt 240.5 lb

## 2018-03-21 DIAGNOSIS — Z8639 Personal history of other endocrine, nutritional and metabolic disease: Secondary | ICD-10-CM | POA: Diagnosis not present

## 2018-03-21 DIAGNOSIS — E785 Hyperlipidemia, unspecified: Secondary | ICD-10-CM

## 2018-03-21 DIAGNOSIS — Z79899 Other long term (current) drug therapy: Secondary | ICD-10-CM

## 2018-03-21 DIAGNOSIS — I10 Essential (primary) hypertension: Secondary | ICD-10-CM

## 2018-03-21 DIAGNOSIS — Z23 Encounter for immunization: Secondary | ICD-10-CM

## 2018-03-21 DIAGNOSIS — I5032 Chronic diastolic (congestive) heart failure: Secondary | ICD-10-CM

## 2018-03-21 DIAGNOSIS — R7302 Impaired glucose tolerance (oral): Secondary | ICD-10-CM | POA: Diagnosis not present

## 2018-03-21 LAB — POCT GLYCOSYLATED HEMOGLOBIN (HGB A1C): Hemoglobin A1C: 5.4 % (ref 4.0–5.6)

## 2018-03-21 MED ORDER — TETANUS-DIPHTH-ACELL PERTUSSIS 5-2.5-18.5 LF-MCG/0.5 IM SUSP: 0.5000 mL | Freq: Once | INTRAMUSCULAR | 0 refills | Status: AC

## 2018-03-21 NOTE — Progress Notes (Signed)
Impression and Recommendations:    1. Hypertension, unspecified type   2. Morbid obesity ([place])   3. Hyperlipidemia, unspecified hyperlipidemia type   4. Chronic diastolic CHF (congestive heart failure) ([place])   5. Glucose intolerance (impaired glucose tolerance)   6. History of diabetes mellitus   7. On potassium wasting diuretic therapy   8. Need for Td vaccine     1. Congestive Heart Failure - Continue CHF treatment as prescribed by cardiology and CHF Clinic.  - Patient currently lost to follow up with CHF clinic until October.  Encouraged patient to try to follow up with CHF clinic sooner if at all possible.  - Patient knows that it is critically important for her to continue following up with the CHF clinic.  Educated her at length to try to follow up, with a new provider if needed.  -We will check potassium level today since on high risk diuretics  - Advised patient to elevate feet as often as possible when at home.  - Encouraged patient to move around as often as possible and be more physically active.  2. HLD - Lipid panel up to date and at goal. - Patient maintained on current treatment.  See med list today. - Will continue to monitor.  3. HTN - Blood pressure well controlled at this time. - Patient tolerates meds well without S-E.  See med list below. - Continue on medications as prescribed.  4. Prediabetes/Glucose Intolerance with history of diabetes - HbA1c currently 5.4, down from 6.1 three months ago.  - Patient knows that if her A1c increases above 6.5, she will receive diabetes diagnosis and need to begin treatment. -A1c was  5. BMI Counseling Explained to patient what BMI refers to, and what it means medically.    Told patient to think about it as a "medical risk stratification measurement" and how increasing BMI is associated with increasing risk/ or worsening state of various diseases such as hypertension, hyperlipidemia, diabetes, premature  OA, depression etc.  American Heart Association guidelines for healthy diet, basically Mediterranean diet, and exercise guidelines of 30 minutes 5 days per week or more discussed in detail.  Health counseling performed.  All questions answered.  6. Lifestyle & Preventative Health Maintenance - Advised patient to continue working toward exercising to improve overall mental, physical, and emotional health.    - Encouraged patient to engage in daily physical activity, especially a formal exercise routine.  Recommended that the patient eventually strive for at least 150 minutes of moderate cardiovascular activity per week according to guidelines established by the [facility].   - Healthy dietary habits encouraged, including low-carb, and high amounts of lean protein in diet.   - Patient should also consume adequate amounts of water - half of body weight in oz of water per day.  7. Follow-Up - Need for potassium check; BMP drawn today.  - Continue to return for regularly scheduled chronic follow up, along with continuing to follow up with specialists as recommended.   Education and routine counseling performed. Handouts provided.   Orders Placed This Encounter  Procedures  . Basic Metabolic Panel (BMET)  . POCT glycosylated hemoglobin (Hb A1C)    Meds ordered this encounter  Medications  . Tdap (BOOSTRIX) 5-2.5-18.5 LF-MCG/0.5 injection    Sig: Inject 0.5 mLs into the muscle once for 1 dose.    Dispense:  0.5 mL    Refill:  0    Medications Discontinued During This Encounter  Medication Reason  .  hydroxychloroquine (PLAQUENIL) 119 MG tablet Duplicate  . leflunomide (ARAVA) 20 MG tablet Duplicate     The patient was counseled, risk factors were discussed, anticipatory guidance given.  Gross side effects, risk and benefits, and alternatives of medications discussed with patient.  Patient is aware that all medications have potential side effects and we are unable to predict every side  effect or drug-drug interaction that may occur.  Expresses verbal understanding and consents to current therapy plan and treatment regimen.  Return for Follow-up 4 months.  Please see AVS handed out to patient at the end of our visit for further patient instructions/ counseling done pertaining to today's office visit.    Note:  This document was prepared using Dragon voice recognition software and may include unintentional dictation errors.    This document serves as a record of services personally performed by Mellody Dance, DO. It was created on her behalf by [name], a trained medical scribe. The creation of this record is based on the scribe's personal observations and the provider's statements to them.   I have reviewed the above medical documentation for accuracy and completeness and I concur.  Mellody Dance 05/05/18 5:08 PM    Subjective:    HPI: [name] is a 67 y.o. female who presents to [place] [facility] today for follow up for HTN.    States "I'm alright, I guess."  Notes she's gaining weight, but other than that, nothing new.  Notes she doesn't eat all the time, so she's feeling confused about her weight gain.  Mood has been slightly poor lately, but patient declines need for medication or treatment for her mood today.  Recently used some CBD oil and got in trouble with her other specialists due to potential exposure to [facility].  Congestive Heart Failure Notes still having issues with fluid if she doesn't take diuretics.  Notes that due to fluid, she can lose six lbs in a day, or gain 3-4 lbs in one day.  Patient takes between 40-60 mg of Lasix "depending on how I am."  Before that, takes the metolazone.  The rest of the time, she's supposed to take Demadex.  Notes she's not always compliant with this.  States she only does her treatment when she's feeling overloaded.  Notes a little bit of trouble lying flat.  "I've never laid  completely flat."  Sx may be a little worse than usual.  Feels the fluid more in her lungs.  States it's hard to get a good deep breath.  Also notes that she has had pains in her neck similar to when she went to the ER.  Is able to keep up with her activities, but feeling a little SOB.  She isn't following up with the CHF clinic anymore "because they said I didn't need it anymore."  Notes that Dr. Radford Pax doesn't check her potassium and she needs her potassium checked.  Lifestyle Habits She hasn't been exercising.  Notes that she fell and bruised her pelvis/hipbone/femur badly on one side.  This pain has prevented her from exercising for the past 3-4 weeks.  Patient states she hasn't eaten today; "I just was busy and haven't had anything."  HTN:  -  Her blood pressure has been controlled at home.   - Patient reports good compliance with blood pressure medications  - Denies medication S-E   - Smoking Status noted   - She denies new onset of: chest pain, exercise intolerance, shortness  of breath, dizziness, visual changes, headache, lower extremity swelling or claudication.   Last 3 blood pressure readings in our office are as follows: BP Readings from Last 3 Encounters:  04/25/18 (!) 96/58  03/21/18 112/63  02/05/18 120/78    Pulse Readings from Last 3 Encounters:  04/25/18 71  03/21/18 95  02/05/18 77    Filed Weights   03/21/18 1512  Weight: 240 lb 8 oz (109.1 kg)      Patient Care Team    Relationship Specialty Notifications Start End  Mellody Dance, DO PCP - General Family Medicine  01/19/16   Bo Merino, MD Consulting Physician Rheumatology  02/02/14   Sueanne Margarita, MD Consulting Physician Cardiology  05/23/16   Gaynelle Arabian, MD Consulting Physician Orthopedic Surgery  05/23/16   Kathrynn Ducking, MD Consulting Physician Neurology  05/23/16   Juanita Craver, MD Consulting Physician Gastroenterology  05/23/16   Martinique, Amy, MD Consulting Physician  Dermatology  05/23/16   Corliss Parish, MD Consulting Physician Nephrology  07/02/17    Comment: Seen for hypokalemia.     Lab Results  Component Value Date   CREATININE 1.00 03/21/2018   BUN 13 03/21/2018   NA 137 03/21/2018   K 3.6 03/21/2018   CL 93 (L) 03/21/2018   CO2 24 03/21/2018    Lab Results  Component Value Date   CHOL 143 11/26/2017   CHOL 249 (H) 07/02/2017   CHOL 201 (H) 08/11/2016    Lab Results  Component Value Date   HDL 47 11/26/2017   HDL 50 07/02/2017   HDL 43 (L) 08/11/2016    Lab Results  Component Value Date   LDLCALC 43 11/26/2017   LDLCALC 138 (H) 07/02/2017   LDLCALC 99 08/11/2016    Lab Results  Component Value Date   TRIG 263 (H) 11/26/2017   TRIG 304 (H) 07/02/2017   TRIG 293 (H) 08/11/2016    Lab Results  Component Value Date   CHOLHDL 3.0 11/26/2017   CHOLHDL 5.0 (H) 07/02/2017   CHOLHDL 4.7 08/11/2016    No results found for: LDLDIRECT ===================================================================   Patient Active Problem List   Diagnosis Date Noted  . Elevated triglycerides with high cholesterol 08/23/2016    Priority: High  . Peripheral polyneuropathy 02/02/2014    Priority: High  . Morbid obesity ([place]) 11/17/2013    Priority: High  . OSA (obstructive sleep apnea)     Priority: High  . Low serum HDL 08/23/2016    Priority: Medium  . GERD (gastroesophageal reflux disease) 01/19/2016    Priority: Medium  . Fibromyalgia     Priority: Medium  . Sjoegren syndrome 02/29/2016    Priority: Low  . On potassium wasting diuretic therapy 02/29/2016    Priority: Low  . Glucose intolerance (impaired glucose tolerance) 01/19/2016    Priority: Low  . Chronic diastolic CHF (congestive heart failure) ([place]) 04/20/2015    Priority: Low  . Rheumatoid arthritis ([place])     Priority: Low  . Vitamin D deficiency 11/20/2017  . Mixed hyperlipidemia 11/20/2017  . Muscle cramps 11/20/2017  . Family history of coronary  arteriosclerosis- strong fam h/o CAD and early CAD.  07/17/2017  . Raynaud's disease without gangrene 03/12/2017  . Abnormal weight gain 02/06/2017  . Shingles outbreak 02/06/2017  . Cyst (solitary) of breast, left 01/31/2017  . Eosinophilic esophagitis 67/61/9509  . History of hyperlipidemia 10/04/2016  . 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 History of diabetes mellitus 09/09/2016  . History of  diverticulitis 09/03/2016  . Osteoporosis 09/03/2016  . Autoimmune disease ([place]) 09/02/2016  . High risk medication use 09/02/2016  . History of esophagitis 09/02/2016  . Breast cyst, left 06/14/2016  . Encounter for wellness examination 05/23/2016  . Abnormality of gait 05/09/2016  . Hypokalemia 03/22/2016  . Heart murmur   . Hiatal hernia   . PVC's (premature ventricular contractions)   . 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 Unilateral primary osteoarthritis, left knee 06/22/2011     Past Medical History:  Diagnosis Date  . Arthritis    Rheumatoid arthritis,   . Blood transfusion    1981  . Chronic diastolic CHF (congestive heart failure) ([facility])    diastolic   . Fibromyalgia   . Heart murmur    as a child  . Hiatal hernia    sjorgens syndrome  . OSA (obstructive sleep apnea)   . Peripheral autonomic neuropathy of unknown cause      Past Surgical History:  Procedure Laterality Date  . ABDOMINAL HYSTERECTOMY     BTL, BSO  . BREAST SURGERY     mass removal   . CHOLECYSTECTOMY    . DIAGNOSTIC LAPAROSCOPY     x3  . KNEE ARTHROSCOPY     x2  . MASS EXCISION Left 03/22/2017   Procedure: EXCISION OF LEFT BREAST MASS;  Surgeon: Donnie Mesa, MD;  Location: [facility];  Service: General;  Laterality: Left;  . patotid cystectomy    . TENDON REPAIR  1980   left ankle and tibia  . TONSILLECTOMY    . TOTAL KNEE ARTHROPLASTY  06/21/2011   Procedure: TOTAL KNEE ARTHROPLASTY;  Surgeon: Gearlean Alf;  Location: [facility];  Service: Orthopedics;  Laterality:  Right;  . TUBAL LIGATION       Family History  Problem Relation Age of Onset  . Alzheimer's disease Mother   . Heart attack Mother   . Hypertension Mother   . Glaucoma Mother   . Cancer Father   . Heart attack Brother   . Heart disease Brother   . Glaucoma Brother   . Hyperlipidemia Brother   . Glaucoma Brother      Social History   Substance and Sexual Activity  Drug Use No  ,  Social History   Substance and Sexual Activity  Alcohol Use Yes   Comment: socially   ,  Social History   Tobacco Use  Smoking Status Never Smoker  Smokeless Tobacco Never Used  ,    Current Outpatient Medications on File Prior to Visit  Medication Sig Dispense Refill  . aspirin EC 81 MG tablet Take 81 mg by mouth at bedtime.    [name] atorvastatin (LIPITOR) 20 MG tablet Take 1 tablet (20 mg total) by mouth daily. 90 tablet 3  . carbamazepine (TEGRETOL) 200 MG tablet Take 1 tablet (200 mg total) by mouth at bedtime. 90 tablet 1  . cycloSPORINE (RESTASIS) 0.05 % ophthalmic emulsion Place 2 drops into both eyes 2 (two) times daily.     . diphenoxylate-atropine (LOMOTIL) 2.5-0.025 MG per tablet Take 1 tablet by mouth 4 (four) times daily as needed for diarrhea or loose stools.    . furosemide (LASIX) 20 MG tablet Take 20 mg by mouth as needed (60-80mg  as needed).    . gabapentin (NEURONTIN) 300 MG capsule TAKE 2 CAPSULES IN THE MORNING AND 3 CAPSULES AT BEDTIME 450 capsule 3  . HYDROcodone-acetaminophen (NORCO/VICODIN) 5-325 MG tablet TAKE 1 TABLET BY MOUTH AT BEDTIME AS  NEEDED. 30 tablet 0  . hydroxychloroquine (PLAQUENIL) 200 MG tablet Take 200 mg by mouth See admin instructions. 200 mg by mouth two times a day on Mon/Tues/Wed/Thurs/Fri only  0  . ibuprofen (ADVIL,MOTRIN) 400 MG tablet Take 1 tablet (400 mg total) by mouth every 6 (six) hours as needed. 30 tablet 0  . leflunomide (ARAVA) 20 MG tablet TAKE 1 TABLET BY MOUTH DAILY. 90 tablet 0  . lidocaine (LIDODERM) 5 % Place 1 patch onto the skin  daily as needed (for pain). Remove & Discard patch within 12 hours or as directed by MD 90 patch 0  . metolazone (ZAROXOLYN) 2.5 MG tablet Take 2.5 mg by mouth daily as needed.    . metoprolol succinate (TOPROL-XL) 25 MG 24 hr tablet TAKE 1 TABLET BY MOUTH DAILY. 90 tablet 2  . Multiple Vitamin (MULTIVITAMIN WITH MINERALS) TABS tablet Take 1 tablet by mouth daily.    . NONFORMULARY OR COMPOUNDED ITEM Triamcinolone 0.1% & Silvadene cream 1:1- Apply as directed to affected areas    . potassium chloride (K-DUR) 10 MEQ tablet Take 6 tablets (60 mEq total) by mouth every morning AND 4 tablets (40 mEq total) daily with lunch AND 4 tablets (40 mEq total) every evening. 1260 tablet 3  . predniSONE (DELTASONE) 1 MG tablet Take 4 tablets (4 mg total) by mouth daily with breakfast. (Patient not taking: Reported on 04/25/2018) 120 tablet 2  . Probiotic Product ([place]) 170 MG CAPS Take 1 capsule by mouth every morning.    . promethazine (PHENERGAN) 25 MG tablet TAKE 1 TABLET BY MOUTH EVERY 6 HOURS AS NEEDED. 120 tablet 2  . silver sulfADIAZINE (SILVADENE) 1 % cream Apply 1 application topically daily as needed (for skin infection/irritation.).     [name] spironolactone (ALDACTONE) 25 MG tablet Take 25 mg by mouth 2 (two) times daily.    [name] tiZANidine (ZANAFLEX) 4 MG tablet TAKE 1 TABLET BY MOUTH DAILY AT BEDTIME 90 tablet 1  . torsemide (DEMADEX) 20 MG tablet Take 3 tablets (60 mg total) by mouth 2 (two) times daily. 540 tablet 3  . UNABLE TO FIND CPAP: AT bedtime; setting is "12"    . Vitamin D, Ergocalciferol, (DRISDOL) 50000 units CAPS capsule Take 50,000 Units by mouth every Tuesday.      No current facility-administered medications on file prior to visit.      Allergies  Allergen Reactions  . Sulfa Antibiotics Hives and Itching    Flushing also  . Cymbalta [Duloxetine Hcl] Other (See Comments)    Caused a manic reaction  . Demerol Hives and Other (See Comments)    Fever also  . Ivp  Dye [Iodinated Diagnostic Agents] Hives and Other (See Comments)    Fever also  . Morphine And Related Other (See Comments)    ineffective  . Sulfasalazine Other (See Comments)    Reaction ??  . Adhesive [Tape] Rash     Review of Systems:   General:  Denies fever, chills Optho/Auditory:   Denies visual changes, blurred vision Respiratory:   Denies SOB, cough, wheeze, DIB  Cardiovascular:   Denies chest pain, palpitations, painful respirations Gastrointestinal:   Denies nausea, vomiting, diarrhea.  Endocrine:     Denies new hot or cold intolerance Musculoskeletal:  Denies joint swelling, gait issues, or new unexplained myalgias/ arthralgias Skin:  Denies rash, suspicious lesions  Neurological:    Denies dizziness, unexplained weakness, numbness  Psychiatric/Behavioral:   Denies mood changes  Objective:    Blood  pressure 112/63, pulse 95, height 5\' 2"  (1.575 m), weight 240 lb 8 oz (109.1 kg), SpO2 96 %.  Body mass index is 43.99 kg/m.  General: Well Developed, well nourished, and in no acute distress.  HEENT: Normocephalic, atraumatic, pupils equal round reactive to light, neck supple, No carotid bruits, no JVD Skin: Warm and dry, cap RF less 2 sec Cardiac: Regular rate and rhythm, S1, S2 WNL's, no murmurs rubs or gallops Respiratory: ECTA B/L, Not using accessory muscles, speaking in full sentences. NeuroM-Sk: Ambulates w/o assistance, moves ext * 4 w/o difficulty, sensation grossly intact.  Ext: scant edema b/l lower ext Psych: No HI/SI, judgement and insight good, Euthymic mood. Full Affect.

## 2018-03-21 NOTE — Patient Instructions (Signed)
[name] as we discussed please obtain a BMP prior to patient leaving the office today and please send a copy to her cardiologist who manages patient's congestive heart failure.   Heart Failure Heart failure is a condition in which the heart has trouble pumping blood because it has become weak or stiff. This means that the heart does not pump blood efficiently for the body to work well. For some people with heart failure, fluid may back up into the lungs and there may be swelling (edema) in the lower legs. Heart failure is usually a long-term (chronic) condition. It is important for you to take good care of yourself and follow the treatment plan from your health care provider. What are the causes? This condition is caused by some health problems, including:  High blood pressure (hypertension). Hypertension causes the heart muscle to work harder than normal. High blood pressure eventually causes the heart to become stiff and weak.  Coronary artery disease (CAD). CAD is the buildup of cholesterol and fat (plaques) in the arteries of the heart.  Heart attack (myocardial infarction). Injured tissue, which is caused by the heart attack, does not contract as well and the heart's ability to pump blood is weakened.  Abnormal heart valves. When the heart valves do not open and close properly, the heart muscle must pump harder to keep the blood flowing.  Heart muscle disease (cardiomyopathy or myocarditis). Heart muscle disease is damage to the heart muscle from a variety of causes, such as drug or alcohol abuse, infections, or unknown causes. These can increase the risk of heart failure.  Lung disease. When the lungs do not work properly, the heart must work harder.  What increases the risk? Risk of heart failure increases as a person ages. This condition is also more likely to develop in people who:  Are overweight.  Are female.  Smoke or chew tobacco.  Abuse alcohol or illegal drugs.  Have taken  medicines that can damage the heart, such as chemotherapy drugs.  Have diabetes. ? High blood sugar (glucose) is associated with high fat (lipid) levels in the blood. ? Diabetes can also damage tiny blood vessels that carry nutrients to the heart muscle.  Have abnormal heart rhythms.  Have thyroid problems.  Have low blood counts (anemia).  What are the signs or symptoms? Symptoms of this condition include:  Shortness of breath with activity, such as when climbing stairs.  Persistent cough.  Swelling of the feet, ankles, legs, or abdomen.  Unexplained weight gain.  Difficulty breathing when lying flat (orthopnea).  Waking from sleep because of the need to sit up and get more air.  Rapid heartbeat.  Fatigue and loss of energy.  Feeling light-headed, dizzy, or close to fainting.  Loss of appetite.  Nausea.  Increased urination during the night (nocturia).  Confusion.  How is this diagnosed? This condition is diagnosed based on:  Medical history, symptoms, and a physical exam.  Diagnostic tests, which may include: ? Echocardiogram. ? Electrocardiogram (ECG). ? Chest X-ray. ? Blood tests. ? Exercise stress test. ? Radionuclide scans. ? Cardiac catheterization and angiogram.  How is this treated? Treatment for this condition is aimed at managing the symptoms of heart failure. Medicines, behavioral changes, or other treatments may be necessary to treat heart failure. Medicines These may include:  Angiotensin-converting enzyme (ACE) inhibitors. This type of medicine blocks the effects of a blood protein called angiotensin-converting enzyme. ACE inhibitors relax (dilate) the blood vessels and help to lower blood pressure.  Angiotensin receptor blockers (ARBs). This type of medicine blocks the actions of a blood protein called angiotensin. ARBs dilate the blood vessels and help to lower blood pressure.  Water pills (diuretics). Diuretics cause the kidneys to  remove salt and water from the blood. The extra fluid is removed through urination, leaving a lower volume of blood that the heart has to pump.  Beta blockers. These improve heart muscle strength and they prevent the heart from beating too quickly.  Digoxin. This increases the force of the heartbeat.  Healthy behavior changes These may include:  Reaching and maintaining a healthy weight.  Stopping smoking or chewing tobacco.  Eating heart-healthy foods.  Limiting or avoiding alcohol.  Stopping use of street drugs (illegal drugs).  Physical activity.  Other treatments These may include:  Surgery to open blocked coronary arteries or repair damaged heart valves.  Placement of a biventricular pacemaker to improve heart muscle function (cardiac resynchronization therapy). This device paces both the right ventricle and left ventricle.  Placement of a device to treat serious abnormal heart rhythms (implantable cardioverter defibrillator, or ICD).  Placement of a device to improve the pumping ability of the heart (left ventricular assist device, or LVAD).  Heart transplant. This can cure heart failure, and it is considered for certain patients who do not improve with other therapies.  Follow these instructions at home: Medicines  Take over-the-counter and prescription medicines only as told by your health care provider. Medicines are important in reducing the workload of your heart, slowing the progression of heart failure, and improving your symptoms. ? Do not stop taking your medicine unless your health care provider told you to do that. ? Do not skip any dose of medicine. ? Refill your prescriptions before you run out of medicine. You need your medicines every day. Eating and drinking   Eat heart-healthy foods. Talk with a dietitian to make an eating plan that is right for you. ? Choose foods that contain no trans fat and are low in saturated fat and cholesterol. Healthy  choices include fresh or frozen fruits and vegetables, fish, lean meats, legumes, fat-free or low-fat dairy products, and whole-grain or high-fiber foods. ? Limit salt (sodium) if directed by your health care provider. Sodium restriction may reduce symptoms of heart failure. Ask a dietitian to recommend heart-healthy seasonings. ? Use healthy cooking methods instead of frying. Healthy methods include roasting, grilling, broiling, baking, poaching, steaming, and stir-frying.  Limit your fluid intake if directed by your health care provider. Fluid restriction may reduce symptoms of heart failure. Lifestyle  Stop smoking or using chewing tobacco. Nicotine and tobacco can damage your heart and your blood vessels. Do not use nicotine gum or patches before talking to your health care provider.  Limit alcohol intake to no more than 1 drink per day for non-pregnant women and 2 drinks per day for men. One drink equals 12 oz of beer, 5 oz of wine, or 1 oz of hard liquor. ? Drinking more than that is harmful to your heart. Tell your health care provider if you drink alcohol several times a week. ? Talk with your health care provider about whether any level of alcohol use is safe for you. ? If your heart has already been damaged by alcohol or you have severe heart failure, drinking alcohol should be stopped completely.  Stop use of illegal drugs.  Lose weight if directed by your health care provider. Weight loss may reduce symptoms of heart failure.  Do moderate  physical activity if directed by your health care provider. People who are elderly and people with severe heart failure should consult with a health care provider for physical activity recommendations. Monitor important information  Weigh yourself every day. Keeping track of your weight daily helps you to notice excess fluid sooner. ? Weigh yourself every morning after you urinate and before you eat breakfast. ? Wear the same amount of clothing  each time you weigh yourself. ? Record your daily weight. Provide your health care provider with your weight record.  Monitor and record your blood pressure as told by your health care provider.  Check your pulse as told by your health care provider. Dealing with extreme temperatures  If the weather is extremely hot: ? Avoid vigorous physical activity. ? Use air conditioning or fans or seek a cooler location. ? Avoid caffeine and alcohol. ? Wear loose-fitting, lightweight, and light-colored clothing.  If the weather is extremely cold: ? Avoid vigorous physical activity. ? Layer your clothes. ? Wear mittens or gloves, a hat, and a scarf when you go outside. ? Avoid alcohol. General instructions  Manage other health conditions such as hypertension, diabetes, thyroid disease, or abnormal heart rhythms as told by your health care provider.  Learn to manage stress. If you need help to do this, ask your health care provider.  Plan rest periods when fatigued.  Get ongoing education and support as needed.  Participate in or seek rehabilitation as needed to maintain or improve independence and quality of life.  Stay up to date with immunizations. Keeping current on pneumococcal and influenza immunizations is especially important to prevent respiratory infections.  Keep all follow-up visits as told by your health care provider. This is important. Contact a health care provider if:  You have a rapid weight gain.  You have increasing shortness of breath that is unusual for you.  You are unable to participate in your usual physical activities.  You tire easily.  You cough more than normal, especially with physical activity.  You have any swelling or more swelling in areas such as your hands, feet, ankles, or abdomen.  You are unable to sleep because it is hard to breathe.  You feel like your heart is beating quickly (palpitations).  You become dizzy or light-headed when you  stand up. Get help right away if:  You have difficulty breathing.  You notice or your family notices a change in your awareness, such as having trouble staying awake or having difficulty with concentration.  You have pain or discomfort in your chest.  You have an episode of fainting (syncope). This information is not intended to replace advice given to you by your health care provider. Make sure you discuss any questions you have with your health care provider. Document Released: 07/31/2005 Document Revised: 04/04/2016 Document Reviewed: 02/23/2016 Elsevier Interactive Patient Education  [employer].

## 2018-03-22 LAB — BASIC METABOLIC PANEL
BUN/Creatinine Ratio: 13 (ref 12–28)
BUN: 13 mg/dL (ref 8–27)
CO2: 24 mmol/L (ref 20–29)
CREATININE: 1 mg/dL (ref 0.57–1.00)
Calcium: 9.5 mg/dL (ref 8.7–10.3)
Chloride: 93 mmol/L — ABNORMAL LOW (ref 96–106)
GFR, EST AFRICAN AMERICAN: 68 mL/min/{1.73_m2} (ref >59)
GFR, EST NON AFRICAN AMERICAN: 59 mL/min/{1.73_m2} — AB (ref >59)
Glucose: 134 mg/dL — ABNORMAL HIGH (ref 65–99)
Potassium: 3.6 mmol/L (ref 3.5–5.2)
Sodium: 137 mmol/L (ref 134–144)

## 2018-04-23 DIAGNOSIS — K580 Irritable bowel syndrome with diarrhea: Secondary | ICD-10-CM | POA: Diagnosis not present

## 2018-04-23 DIAGNOSIS — K5730 Diverticulosis of large intestine without perforation or abscess without bleeding: Secondary | ICD-10-CM | POA: Diagnosis not present

## 2018-04-24 ENCOUNTER — Ambulatory Visit: Payer: PPO | Admitting: Family Medicine

## 2018-04-25 ENCOUNTER — Encounter: Payer: Self-pay | Admitting: Family Medicine

## 2018-04-25 ENCOUNTER — Ambulatory Visit (INDEPENDENT_AMBULATORY_CARE_PROVIDER_SITE_OTHER): Payer: PPO | Admitting: Family Medicine

## 2018-04-25 VITALS — BP 96/58 | HR 71 | Ht 62.0 in | Wt 234.0 lb

## 2018-04-25 DIAGNOSIS — L509 Urticaria, unspecified: Secondary | ICD-10-CM | POA: Diagnosis not present

## 2018-04-25 MED ORDER — PREDNISONE 10 MG PO TABS: ORAL_TABLET | ORAL | 0 refills | Status: DC

## 2018-04-25 NOTE — Progress Notes (Signed)
Pt here for an acute care OV today   Impression and Recommendations:    1. Hives of unknown origin   2. Localized hives     1. Acute Skin Rash - Hives of Unknown Origin - Educated patient at length about onset of skin symptoms.    - Discussed that the hives are likely being caused by exposure to an allergen, and exposure MUST be avoided to resolve symptoms.  Encouraged patient to assess what she has been exposed to, especially foods.   - Referral to allergist recommended and placed today.  - Prednisone taper prescribed today.  See med list today.   - Discussed with patient at length that prednisone will not assist with complete resolution of symptoms without allergen removal.  - If itching occurs, patient may begin taking Zantac daily along with Benadryl, according to guidelines.  - Patient will have labs drawn today to investigate common allergens.  2. Follow-Up - Return if symptoms worsen or do not resolve. - Otherwise, return for regularly scheduled chronic follow up, and other acute concerns PRN.   Meds ordered this encounter  Medications  . predniSONE (DELTASONE) 10 MG tablet    Sig: 20 mg TID x3 days, 10 mg 3 times daily x3 days, 5 mg TID x 3 days, then 5 mg BID x 3 days, then 5 mg QD x2 d    Dispense:  45 tablet    Refill:  0     Orders Placed This Encounter  Procedures  . Allergy Panel 11, Mold Group  . Allergy Panel 18, Nut Mix Group  . Allergy Panel 19, Seafood Group  . Allergy Panel, Animal Group  . Allergen Profile, Shellfish  . Allergen Profile, Mold  . Allergen Profile, Vegetable II  . Allergen Profile, Food-Grain  . Ambulatory referral to Allergy     Education and routine counseling performed. Handouts provided  Gross side effects, risk and benefits, and alternatives of medications and treatment plan in general discussed with patient.  Patient is aware that all medications have potential side effects and we are unable to predict every side  effect or drug-drug interaction that may occur.   Patient will call with any questions prior to using medication if they have concerns.  Expresses verbal understanding and consents to current therapy and treatment regimen.  No barriers to understanding were identified.  Red flag symptoms and signs discussed in detail.  Patient expressed understanding regarding what to do in case of emergency\urgent symptoms   Please see AVS handed out to patient at the end of our visit for further patient instructions/ counseling done pertaining to today's office visit.   Return if symptoms worsen or fail to improve, for Also follow-up chronic care as previously discussed.     Note:  This document was prepared occasionally using Dragon voice recognition software and may include unintentional dictation errors in addition to a scribe.  This document serves as a record of services personally performed by Mellody Dance, DO. It was created on her behalf by [name], a trained medical scribe. The creation of this record is based on the scribe's personal observations and the provider's statements to them.   I have reviewed the above medical documentation for accuracy and completeness and I concur.  Mellody Dance 04/25/18 3:51 PM   ----------------------------------------------------------------------------------------------------------------    Subjective:    CC:  Chief Complaint  Patient presents with  . Rash    HPI: [name] is a 67 y.o. female  who presents to [place] at [facility] today for issues as discussed below.  Acute Skin Rash Patient has an acute skin rash, including today.  Notes "today it's very light."  States it waxes and wanes but never goes.  Describes it as hives.  Notes that the hives do not itch.  They only occur on the ipper extremities and central trunk; patient confirms that it's all occuring in the upper body.  Denies onset on face, neck, or  lower extremities.  Denies wheezing, SOB, difficulty breathing, swelling, or other anaphylaxis.  Denies itch.  Cannot remember doing anything new or eating anything new.  States that none of her habits have changed.  Notes she eats cheese, eggs, bread; she tries to avoid spices at all costs due to having a bad stomach.  She is also not having an autoimmune flare up at this time.  Prednisone Treatment Patient has not been treated on prednisone for a while.  Acute Diarrheal Concerns Patient was put on Xifaxan by Dr. Collene Mares for diarrhea (this occurs periodically).  Started antibiotic on Tuesday, and notes she only needs to take it for five days.    Wt Readings from Last 3 Encounters:  04/25/18 234 lb (106.1 kg)  03/21/18 240 lb 8 oz (109.1 kg)  02/05/18 246 lb (111.6 kg)   BP Readings from Last 3 Encounters:  04/25/18 (!) 96/58  03/21/18 112/63  02/05/18 120/78   BMI Readings from Last 3 Encounters:  04/25/18 42.80 kg/m  03/21/18 43.99 kg/m  02/05/18 44.99 kg/m     Patient Care Team    Relationship Specialty Notifications Start End  Mellody Dance, DO PCP - General Family Medicine  01/19/16   Bo Merino, MD Consulting Physician Rheumatology  02/02/14   Sueanne Margarita, MD Consulting Physician Cardiology  05/23/16   Gaynelle Arabian, MD Consulting Physician Orthopedic Surgery  05/23/16   Kathrynn Ducking, MD Consulting Physician Neurology  05/23/16   Juanita Craver, MD Consulting Physician Gastroenterology  05/23/16   Martinique, Amy, MD Consulting Physician Dermatology  05/23/16   Corliss Parish, MD Consulting Physician Nephrology  07/02/17    Comment: Seen for hypokalemia.     Patient Active Problem List   Diagnosis Date Noted  . Elevated triglycerides with high cholesterol 08/23/2016    Priority: High  . Peripheral polyneuropathy 02/02/2014    Priority: High  . Morbid obesity ([place]) 11/17/2013    Priority: High  . OSA (obstructive sleep apnea)     Priority:  High  . Low serum HDL 08/23/2016    Priority: Medium  . GERD (gastroesophageal reflux disease) 01/19/2016    Priority: Medium  . Fibromyalgia     Priority: Medium  . Sjoegren syndrome 02/29/2016    Priority: Low  . On potassium wasting diuretic therapy 02/29/2016    Priority: Low  . Glucose intolerance (impaired glucose tolerance) 01/19/2016    Priority: Low  . Chronic diastolic CHF (congestive heart failure) ([place]) 04/20/2015    Priority: Low  . Rheumatoid arthritis ([place])     Priority: Low  . Vitamin D deficiency 11/20/2017  . Mixed hyperlipidemia 11/20/2017  . Muscle cramps 11/20/2017  . Family history of coronary arteriosclerosis- strong fam h/o CAD and early CAD.  07/17/2017  . Raynaud's disease without gangrene 03/12/2017  . Abnormal weight gain 02/06/2017  . Shingles outbreak 02/06/2017  . Cyst (solitary) of breast, left 01/31/2017  . Eosinophilic esophagitis 15/40/0867  . History of hyperlipidemia 10/04/2016  . Osteoarthritis of lumbar  spine 09/09/2016  . History of diabetes mellitus 09/09/2016  . History of diverticulitis 09/03/2016  . Osteoporosis 09/03/2016  . Autoimmune disease ([place]) 09/02/2016  . High risk medication use 09/02/2016  . History of esophagitis 09/02/2016  . Breast cyst, left 06/14/2016  . Encounter for wellness examination 05/23/2016  . Abnormality of gait 05/09/2016  . Hypokalemia 03/22/2016  . Heart murmur   . Hiatal hernia   . PVC's (premature ventricular contractions)   . 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 Unilateral primary osteoarthritis, left knee 06/22/2011    Past Medical history, Surgical history, Family history, Social history, Allergies and Medications have been entered into the medical record, reviewed and changed as needed.    Current Meds  Medication Sig  . aspirin EC 81 MG tablet Take 81 mg by mouth at bedtime.  [name] atorvastatin (LIPITOR) 20 MG tablet Take 1 tablet (20 mg total) by mouth daily.  .  carbamazepine (TEGRETOL) 200 MG tablet Take 1 tablet (200 mg total) by mouth at bedtime.  . cycloSPORINE (RESTASIS) 0.05 % ophthalmic emulsion Place 2 drops into both eyes 2 (two) times daily.   . diphenoxylate-atropine (LOMOTIL) 2.5-0.025 MG per tablet Take 1 tablet by mouth 4 (four) times daily as needed for diarrhea or loose stools.  . furosemide (LASIX) 20 MG tablet Take 20 mg by mouth as needed (60-80mg  as needed).  . gabapentin (NEURONTIN) 300 MG capsule TAKE 2 CAPSULES IN THE MORNING AND 3 CAPSULES AT BEDTIME  . HYDROcodone-acetaminophen (NORCO/VICODIN) 5-325 MG tablet TAKE 1 TABLET BY MOUTH AT BEDTIME AS NEEDED.  . hydroxychloroquine (PLAQUENIL) 200 MG tablet Take 200 mg by mouth See admin instructions. 200 mg by mouth two times a day on Mon/Tues/Wed/Thurs/Fri only  . ibuprofen (ADVIL,MOTRIN) 400 MG tablet Take 1 tablet (400 mg total) by mouth every 6 (six) hours as needed.  . leflunomide (ARAVA) 20 MG tablet TAKE 1 TABLET BY MOUTH DAILY.  [name] lidocaine (LIDODERM) 5 % Place 1 patch onto the skin daily as needed (for pain). Remove & Discard patch within 12 hours or as directed by MD  . metolazone (ZAROXOLYN) 2.5 MG tablet Take 2.5 mg by mouth daily as needed.  . metoprolol succinate (TOPROL-XL) 25 MG 24 hr tablet TAKE 1 TABLET BY MOUTH DAILY.  . Multiple Vitamin (MULTIVITAMIN WITH MINERALS) TABS tablet Take 1 tablet by mouth daily.  . NONFORMULARY OR COMPOUNDED ITEM Triamcinolone 0.1% & Silvadene cream 1:1- Apply as directed to affected areas  . potassium chloride (K-DUR) 10 MEQ tablet Take 6 tablets (60 mEq total) by mouth every morning AND 4 tablets (40 mEq total) daily with lunch AND 4 tablets (40 mEq total) every evening.  . Probiotic Product (RESTORA PO) Take by mouth.  . Probiotic Product (Cos Cob) 170 MG CAPS Take 1 capsule by mouth every morning.  . promethazine (PHENERGAN) 25 MG tablet TAKE 1 TABLET BY MOUTH EVERY 6 HOURS AS NEEDED.  [name] silver sulfADIAZINE (SILVADENE)  1 % cream Apply 1 application topically daily as needed (for skin infection/irritation.).   [name] spironolactone (ALDACTONE) 25 MG tablet Take 25 mg by mouth 2 (two) times daily.  [name] tiZANidine (ZANAFLEX) 4 MG tablet TAKE 1 TABLET BY MOUTH DAILY AT BEDTIME  . torsemide (DEMADEX) 20 MG tablet Take 3 tablets (60 mg total) by mouth 2 (two) times daily.  [name] UNABLE TO FIND CPAP: AT bedtime; setting is "12"  . Vitamin D, Ergocalciferol, (DRISDOL) 50000 units CAPS capsule Take 50,000 Units by mouth every  Tuesday.     Allergies:  Allergies  Allergen Reactions  . Sulfa Antibiotics Hives and Itching    Flushing also  . Cymbalta [Duloxetine Hcl] Other (See Comments)    Caused a manic reaction  . Demerol Hives and Other (See Comments)    Fever also  . Ivp Dye [Iodinated Diagnostic Agents] Hives and Other (See Comments)    Fever also  . Morphine And Related Other (See Comments)    ineffective  . Sulfasalazine Other (See Comments)    Reaction ??  . Adhesive [Tape] Rash     Review of Systems: General:   Denies fever, chills, unexplained weight loss.  Optho/Auditory:   Denies visual changes, blurred vision/LOV Respiratory:   Denies wheeze, DOE more than baseline levels.   Cardiovascular:   Denies chest pain, palpitations, new onset peripheral edema  Gastrointestinal:   Denies nausea, vomiting, diarrhea, abd pain.  Genitourinary: Denies dysuria, freq/ urgency, flank pain or discharge from genitals.  Endocrine:     Denies hot or cold intolerance, polyuria, polydipsia. Musculoskeletal:   Denies unexplained myalgias, joint swelling, unexplained arthralgias, gait problems.  Skin:  + new onset rash/hives, denies other suspicious lesions Neurological:     Denies dizziness, unexplained weakness, numbness  Psychiatric/Behavioral:   Denies mood changes, suicidal or homicidal ideations, hallucinations    Objective:   Blood pressure (!) 96/58, pulse 71, height 5\' 2"  (1.575 m), weight 234 lb (106.1 kg), SpO2  97 %. Body mass index is 42.8 kg/m. General:  Well Developed, well nourished, appropriate for stated age.  Neuro:  Alert and oriented,  extra-ocular muscles intact  HEENT:  Normocephalic, atraumatic, neck supple Skin: Multiple hives over back, abdomen, and upper extremities, ranging from 1 cm in diameter, to 3 cm in diameter.  Otherwise warm, pink. Cardiac:  RRR, S1 S2 Respiratory:  ECTA B/L and A/P, Not using accessory muscles, speaking in full sentences- unlabored. Vascular:  Ext warm, no cyanosis apprec.; cap RF less 2 sec. Psych:  No HI/SI, judgement and insight good, Euthymic mood. Full Affect.

## 2018-04-25 NOTE — Patient Instructions (Signed)
Hives  Hives (urticaria) are itchy, red, swollen areas on your skin. Hives can appear on any part of your body and can vary in size. They can be as small as the tip of a pen or much larger. Hives often fade within 24 hours (acute hives). In other cases, new hives appear after old ones fade. This cycle can continue for several days or weeks (chronic hives).  Hives result from your body's reaction to an irritant or to something that you are allergic to (trigger). When you are exposed to a trigger, your body releases a chemical (histamine) that causes redness, itching, and swelling. You can get hives immediately after being exposed to a trigger or hours later.  Hives do not spread from person to person (are not contagious). Your hives may get worse with scratching, exercise, and emotional stress.  What are the causes?  Causes of this condition include:   Allergies to certain foods or ingredients.   Insect bites or stings.   Exposure to pollen or pet dander.   Contact with latex or chemicals.   Spending time in sunlight, heat, or cold (exposure).   Exercise.   Stress.    You can also get hives from some medical conditions and treatments. These include:   Viruses, including the common cold.   Bacterial infections, such as urinary tract infections and strep throat.   Disorders such as vasculitis, lupus, or thyroid disease.   Certain medications.   Allergy shots.   Blood transfusions.    Sometimes, the cause of hives is not known (idiopathic hives).  What increases the risk?  This condition is more likely to develop in:   Women.   People who have food allergies, especially to citrus fruits, milk, eggs, peanuts, tree nuts, or shellfish.   People who are allergic to:  ? Medicines.  ? Latex.  ? Insects.  ? Animals.  ? Pollen.   People who have certain medical conditions, includinglupus or thyroid disease.    What are the signs or symptoms?  The main symptom of this condition is raised, itchyred or white  bumps or patches on your skin. These areas may:   Become large and swollen (welts).   Change in shape and location, quickly and repeatedly.   Be separate hives or connect over a large area of skin.   Sting or become painful.   Turn white when pressed in the center (blanch).    In severe cases, yourhands, feet, and face may also become swollen. This may occur if hives develop deeper in your skin.  How is this diagnosed?  This condition is diagnosed based on your symptoms, medical history, and physical exam. Your skin, urine, or blood may be tested to find out what is causing your hives and to rule out other health issues. Your health care provider may also remove a small sample of skin from the affected area and examine it under a microscope (biopsy).  How is this treated?  Treatment depends on the severity of your condition. Your health care provider may recommend using cool, wet cloths (cool compresses) or taking cool showers to relieve itching. Hives are sometimes treated with medicines, including:   Antihistamines.   Corticosteroids.   Antibiotics.   An injectable medicine (omalizumab). Your health care provider may prescribe this if you have chronic idiopathic hives and you continue to have symptoms even after treatment with antihistamines.    Severe cases may require an emergency injection of adrenaline (epinephrine) to prevent a   life-threatening allergic reaction (anaphylaxis).  Follow these instructions at home:  Medicines   Take or apply over-the-counter and prescription medicines only as told by your health care provider.   If you were prescribed an antibiotic medicine, use it as told by your health care provider. Do not stop taking the antibiotic even if you start to feel better.  Skin Care   Apply cool compresses to the affected areas.   Do not scratch or rub your skin.  General instructions   Do not take hot showers or baths. This can make itching worse.   Do not wear tight-fitting  clothing.   Use sunscreen and wear protective clothing when you are outside.   Avoid any substances that cause your hives. Keep a journal to help you track what causes your hives. Write down:  ? What medicines you take.  ? What you eat and drink.  ? What products you use on your skin.   Keep all follow-up visits as told by your health care provider. This is important.  Contact a health care provider if:   Your symptoms are not controlled with medicine.   Your joints are painful or swollen.  Get help right away if:   You have a fever.   You have pain in your abdomen.   Your tongue or lips are swollen.   Your eyelids are swollen.   Your chest or throat feels tight.   You have trouble breathing or swallowing.  These symptoms may represent a serious problem that is an emergency. Do not wait to see if the symptoms will go away. Get medical help right away. Call your local emergency services (911 in the [place] Do not drive yourself to the hospital.  This information is not intended to replace advice given to you by your health care provider. Make sure you discuss any questions you have with your health care provider.  Document Released: 07/31/2005 Document Revised: 12/29/2015 Document Reviewed: 05/19/2015  Elsevier Interactive Patient Education  2018 [employer]

## 2018-04-30 LAB — ALLERGEN PROFILE, MOLD
Aureobasidi Pullulans IgE: <0.1 kU/L
Candida Albicans IgE: <0.1 kU/L
M009-IgE Fusarium proliferatum: <0.1 kU/L
M014-IgE Epicoccum purpur: <0.1 kU/L
Penicillium Chrysogen IgE: <0.1 kU/L
Phoma Betae IgE: <0.1 kU/L
Stemphylium Herbarum IgE: <0.1 kU/L

## 2018-04-30 LAB — ALLERGEN PROFILE, VEGETABLE II
Allergen Carrot IgE: <0.1 kU/L
Allergen Green Pea IgE: <0.1 kU/L
Allergen Onion IgE: <0.1 kU/L
Allergen Tomato, IgE: <0.1 kU/L
Kidney Bean IgE: <0.1 kU/L
Soybean IgE: <0.1 kU/L

## 2018-04-30 LAB — ALLERGEN PROFILE, SHELLFISH: Scallop IgE: <0.1 kU/L

## 2018-04-30 LAB — ALLERGEN PROFILE, VEGETABLE I
Allergen Broccoli: <0.1 kU/L
Allergen Cabbage IgE: <0.1 kU/L
Allergen Celery IgE: <0.1 kU/L
Allergen Cucumber IgE: <0.1 kU/L
Allergen Lettuce IgE: <0.1 kU/L

## 2018-04-30 LAB — ALLERGEN PROFILE, FOOD-GRAIN
Allergen Corn, IgE: <0.1 kU/L
Allergen Rice IgE: <0.1 kU/L
Rye IgE: <0.1 kU/L

## 2018-04-30 LAB — ALLERGY PANEL 19, SEAFOOD GROUP
Catfish: <0.1 kU/L
F023-IgE Crab: <0.1 kU/L
F080-IgE Lobster: <0.1 kU/L
Shrimp IgE: <0.1 kU/L
Tuna: <0.1 kU/L

## 2018-04-30 LAB — ALLERGY PANEL, ANIMAL GROUP
Goose Feathers IgE: <0.1 kU/L
Mouse Urine IgE: <0.1 kU/L

## 2018-04-30 LAB — ALLERGY PANEL 18, NUT MIX GROUP
Hazelnut (Filbert) IgE: <0.1 kU/L
Peanut IgE: <0.1 kU/L
Sesame Seed IgE: <0.1 kU/L

## 2018-04-30 LAB — ALLERGEN PROFILE, SEASONAL ALLERGEN, SUMMER - GRASS IGE
Bahia Grass IgE: <0.1 kU/L
Bermuda Grass IgE: <0.1 kU/L
G004-IgE Fescue, Meadow: <0.1 kU/L
Timothy Grass IgE: <0.1 kU/L

## 2018-05-10 DIAGNOSIS — Z85828 Personal history of other malignant neoplasm of skin: Secondary | ICD-10-CM | POA: Diagnosis not present

## 2018-05-10 DIAGNOSIS — D225 Melanocytic nevi of trunk: Secondary | ICD-10-CM | POA: Diagnosis not present

## 2018-05-10 DIAGNOSIS — L509 Urticaria, unspecified: Secondary | ICD-10-CM | POA: Diagnosis not present

## 2018-05-10 DIAGNOSIS — D1801 Hemangioma of skin and subcutaneous tissue: Secondary | ICD-10-CM | POA: Diagnosis not present

## 2018-05-10 DIAGNOSIS — L821 Other seborrheic keratosis: Secondary | ICD-10-CM | POA: Diagnosis not present

## 2018-05-15 ENCOUNTER — Other Ambulatory Visit: Payer: Self-pay | Admitting: Rheumatology

## 2018-05-15 NOTE — Telephone Encounter (Signed)
Last Visit: 02/05/18 Next visit: 07/16/18 Labs: 02/05/18 Glucose is 157. All other labs are WNL. PLQ Eye Exam: 10/16/17 WNL  Okay to refill per Dr. Estanislado Pandy

## 2018-05-16 ENCOUNTER — Other Ambulatory Visit: Payer: Self-pay | Admitting: Physician Assistant

## 2018-05-17 NOTE — Telephone Encounter (Signed)
Last Visit: 02/05/18 Next visit: 07/16/18 UDS: 02/05/18 Narc Agreement: 02/05/18  Okay to refill Hydrocodone?

## 2018-05-20 ENCOUNTER — Encounter: Payer: Self-pay | Admitting: Allergy and Immunology

## 2018-05-20 ENCOUNTER — Ambulatory Visit: Payer: PPO | Admitting: Allergy and Immunology

## 2018-05-20 VITALS — BP 128/80 | HR 86 | Temp 99.0°F | Resp 16 | Ht 62.0 in | Wt 243.4 lb

## 2018-05-20 DIAGNOSIS — L500 Allergic urticaria: Secondary | ICD-10-CM | POA: Insufficient documentation

## 2018-05-20 DIAGNOSIS — J310 Chronic rhinitis: Secondary | ICD-10-CM | POA: Insufficient documentation

## 2018-05-20 DIAGNOSIS — K2970 Gastritis, unspecified, without bleeding: Secondary | ICD-10-CM | POA: Diagnosis not present

## 2018-05-20 MED ORDER — FAMOTIDINE 20 MG PO TABS: 20.0000 mg | ORAL_TABLET | Freq: Two times a day (BID) | ORAL | 5 refills | Status: DC

## 2018-05-20 MED ORDER — FLUTICASONE PROPIONATE 50 MCG/ACT NA SUSP: NASAL | 5 refills | Status: DC

## 2018-05-20 MED ORDER — LEVOCETIRIZINE DIHYDROCHLORIDE 5 MG PO TABS: 5.0000 mg | ORAL_TABLET | Freq: Every evening | ORAL | 5 refills | Status: DC

## 2018-05-20 NOTE — Assessment & Plan Note (Addendum)
Unclear etiology. Skin tests to select food allergens were negative today. NSAIDs and emotional stress commonly exacerbate urticaria but are not the underlying etiology in this case. Physical urticarias are negative by history (i.e. pressure-induced, temperature, vibration, solar, etc.). History and lesions are not consistent with urticaria pigmentosa so I am not suspicious for mastocytosis. There are no concomitant symptoms concerning for anaphylaxis or constitutional symptoms worrisome for an underlying malignancy. We will rule out other potential etiologies with labs. For symptom relief, patient is to take oral antihistamines as directed.  The following labs have been ordered: Anti-thyroglobulin antibody, thyroid peroxidase antibody, tryptase, urea breath test, CBC, ANA, ESR, and galactose-alpha-1,3-galactose IgE level.  The patient will be called with further recommendations after lab results have returned.  Instructions have been discussed and provided for H1/H2 receptor blockade with titration to find lowest effective dose.  A prescription has been provided for levocetirizine 5 mg daily if needed.  A prescription has been provided for famotidine (Pepcid) 20 mg twice daily if needed.  Should there be a significant increase or change in symptoms, a journal is to be kept recording any foods eaten, beverages consumed, medications taken within a 6 hour period prior to the onset of symptoms, as well as record activities being performed, and environmental conditions. For any symptoms concerning for anaphylaxis, 911 is to be called immediately.

## 2018-05-20 NOTE — Patient Instructions (Addendum)
Recurrent urticaria Unclear etiology. Skin tests to select food allergens were negative today. NSAIDs and emotional stress commonly exacerbate urticaria but are not the underlying etiology in this case. Physical urticarias are negative by history (i.e. pressure-induced, temperature, vibration, solar, etc.). History and lesions are not consistent with urticaria pigmentosa so I am not suspicious for mastocytosis. There are no concomitant symptoms concerning for anaphylaxis or constitutional symptoms worrisome for an underlying malignancy. We will rule out other potential etiologies with labs. For symptom relief, patient is to take oral antihistamines as directed.  The following labs have been ordered: Anti-thyroglobulin antibody, thyroid peroxidase antibody, tryptase, urea breath test, CBC, ANA, ESR, and galactose-alpha-1,3-galactose IgE level.  The patient will be called with further recommendations after lab results have returned.  Instructions have been discussed and provided for H1/H2 receptor blockade with titration to find lowest effective dose.  A prescription has been provided for levocetirizine 5 mg daily if needed.  A prescription has been provided for famotidine (Pepcid) 20 mg twice daily if needed.  Should there be a significant increase or change in symptoms, a journal is to be kept recording any foods eaten, beverages consumed, medications taken within a 6 hour period prior to the onset of symptoms, as well as record activities being performed, and environmental conditions. For any symptoms concerning for anaphylaxis, 911 is to be called immediately.  Rhinitis Perennial and seasonal aeroallergens were negative today despite a positive histamine control.  A prescription has been provided for fluticasone nasal spray, one spray per nostril 1-2 times daily as needed. Proper nasal spray technique has been discussed and demonstrated.  Nasal saline spray (i.e. Simply Saline) is recommended  prior to medicated nasal sprays and as needed.   When lab results have returned the patient will be called with further recommendations and follow up instructions.  Urticaria (Hives)  . Levocetirizine (Xyzal) 5 mg twice a day and famotidine (Pepcid) 20 mg twice a day. If no symptoms for 7-14 days then decrease to. . Levocetirizine (Xyzal) 5 mg twice a day and famotidine (Pepcid) 20 mg once a day.  If no symptoms for 7-14 days then decrease to. . Levocetirizine (Xyzal) 5 mg twice a day.  If no symptoms for 7-14 days then decrease to. . Levocetirizine (Xyzal) 5 mg once a day.  May use Benadryl (diphenhydramine) as needed for breakthrough symptoms       If symptoms return, then step up dosage  

## 2018-05-20 NOTE — Progress Notes (Signed)
New Patient Note  RE: [name] MRN: [id number] DOB: 05-13-1951 Date of Office Visit: 05/20/2018  Referring provider: Mellody Dance, DO Primary care provider: Mellody Dance, DO  Chief Complaint: Urticaria; Allergic Reaction; and Nasal Congestion   History of present illness: [name] is a 67 y.o. female seen today in consultation requested by Mellody Dance, DO.  Over the past 6 weeks, [name] has experienced recurrent episodes of hives. The hives have appeared at different times over her chest, back, arms and abdomen.  The lesions are described as erythematous, raised, and pruritic.  Individual hives last less than 24 hours without leaving residual pigmentation or bruising. She denies concomitant angioedema, cardiopulmonary symptoms and GI symptoms. She has not experienced unexpected weight loss, recurrent fevers or drenching night sweats. No specific medication, food, skin care product, detergent, soap, or other environmental triggers have been identified. She does consume dairy products almost daily and so wonders if cow's milk may be the culprit. The symptoms do not seem to correlate with NSAIDs use or emotional stress. She did not have symptoms consistent with a respiratory tract infection at the time of symptom onset. [name] has tried to control symptoms with OTC antihistamines which have offered adequate temporary relief. She has not been evaluated and treated in the emergency department for these symptoms. Skin biopsy has not been performed. Recently, she has been experiencing hives daily.  She had some labs drawn recently which did not reveal serum specific IgE elevation to foods or environmental panels, however she was on prednisone at the time of the testing for rheumatoid arthritis and Sjogren's syndrome. [name] experiences rhinorrhea and sneezing.  The symptoms occur most frequently during the springtime and in the fall.  She attempts to control the symptoms with  cetirizine.  Assessment and plan: Recurrent urticaria Unclear etiology. Skin tests to select food allergens were negative today. NSAIDs and emotional stress commonly exacerbate urticaria but are not the underlying etiology in this case. Physical urticarias are negative by history (i.e. pressure-induced, temperature, vibration, solar, etc.). History and lesions are not consistent with urticaria pigmentosa so I am not suspicious for mastocytosis. There are no concomitant symptoms concerning for anaphylaxis or constitutional symptoms worrisome for an underlying malignancy. We will rule out other potential etiologies with labs. For symptom relief, patient is to take oral antihistamines as directed.  The following labs have been ordered: Anti-thyroglobulin antibody, thyroid peroxidase antibody, tryptase, urea breath test, CBC, ANA, ESR, and galactose-alpha-1,3-galactose IgE level.  The patient will be called with further recommendations after lab results have returned.  Instructions have been discussed and provided for H1/H2 receptor blockade with titration to find lowest effective dose.  A prescription has been provided for levocetirizine 5 mg daily if needed.  A prescription has been provided for famotidine (Pepcid) 20 mg twice daily if needed.  Should there be a significant increase or change in symptoms, a journal is to be kept recording any foods eaten, beverages consumed, medications taken within a 6 hour period prior to the onset of symptoms, as well as record activities being performed, and environmental conditions. For any symptoms concerning for anaphylaxis, 911 is to be called immediately.  Rhinitis Perennial and seasonal aeroallergens were negative today despite a positive histamine control.  A prescription has been provided for fluticasone nasal spray, one spray per nostril 1-2 times daily as needed. Proper nasal spray technique has been discussed and demonstrated.  Nasal saline spray  (i.e. Simply Saline) is recommended prior to medicated nasal  sprays and as needed.   Meds ordered this encounter  Medications  . levocetirizine (XYZAL) 5 MG tablet    Sig: Take 1 tablet (5 mg total) by mouth every evening.    Dispense:  30 tablet    Refill:  5  . famotidine (PEPCID) 20 MG tablet    Sig: Take 1 tablet (20 mg total) by mouth 2 (two) times daily.    Dispense:  60 tablet    Refill:  5  . fluticasone (FLONASE) 50 MCG/ACT nasal spray    Sig: Use 1 spray per nostril 1-2 times daily as needed    Dispense:  16 g    Refill:  5    Diagnostics: Environmental skin testing: Negative despite a positive histamine control. Food allergen skin testing: Negative despite a positive histamine control.    Physical examination: Blood pressure 128/80, pulse 86, temperature 99 F (37.2 C), temperature source Oral, resp. rate 16, height '5\' 2"'$  (1.575 m), weight 243 lb 6.4 oz (110.4 kg), SpO2 95 %.  General: Alert, interactive, in no acute distress. HEENT: TMs pearly gray, turbinates moderately edematous with thick discharge, post-pharynx moderately erythematous. Neck: Supple without lymphadenopathy. Lungs: Clear to auscultation without wheezing, rhonchi or rales. CV: Normal S1, S2 without murmurs. Abdomen: Nondistended, nontender. Skin: Warm and dry, without lesions or rashes. Extremities:  No clubbing, cyanosis or edema. Neuro:   Grossly intact.  Review of systems:  Review of systems negative except as noted in HPI / PMHx or noted below: Review of Systems  Constitutional: Negative.   HENT: Negative.   Eyes: Negative.   Respiratory: Negative.   Cardiovascular: Negative.   Gastrointestinal: Negative.   Genitourinary: Negative.   Musculoskeletal: Negative.   Skin: Negative.   Neurological: Negative.   Endo/Heme/Allergies: Negative.   Psychiatric/Behavioral: Negative.     Past medical history:  Past Medical History:  Diagnosis Date  . Arthritis    Rheumatoid arthritis,    . Blood transfusion    1981  . Chronic diastolic CHF (congestive heart failure) (HCC)    diastolic   . Fibromyalgia   . Heart murmur    as a child  . Hiatal hernia    sjorgens syndrome  . OSA (obstructive sleep apnea)   . Peripheral autonomic neuropathy of unknown cause   . Urticaria     Past surgical history:  Past Surgical History:  Procedure Laterality Date  . ABDOMINAL HYSTERECTOMY     BTL, BSO  . ADENOIDECTOMY    . BREAST SURGERY     mass removal   . CHOLECYSTECTOMY    . DIAGNOSTIC LAPAROSCOPY     x3  . KNEE ARTHROSCOPY     x2  . MASS EXCISION Left 03/22/2017   Procedure: EXCISION OF LEFT BREAST MASS;  Surgeon: Donnie Mesa, MD;  Location: [facility];  Service: General;  Laterality: Left;  . patotid cystectomy    . TENDON REPAIR  1980   left ankle and tibia  . TONSILLECTOMY    . TOTAL KNEE ARTHROPLASTY  06/21/2011   Procedure: TOTAL KNEE ARTHROPLASTY;  Surgeon: Gearlean Alf;  Location: [facility];  Service: Orthopedics;  Laterality: Right;  . TUBAL LIGATION      Family history: Family History  Problem Relation Age of Onset  . Alzheimer's disease Mother   . Heart attack Mother   . Hypertension Mother   . Glaucoma Mother   . Cancer Father   . Heart attack Brother   . Heart disease Brother   .  Glaucoma Brother   . Hyperlipidemia Brother   . Glaucoma Brother   . Asthma Son   . Allergic rhinitis Neg Hx   . Angioedema Neg Hx   . Eczema Neg Hx   . Urticaria Neg Hx     Social history: Social History   Socioeconomic History  . Marital status: Married    Spouse name: Not on file  . Number of children: 2  . Years of education: AS  . Highest education level: Not on file  Occupational History  . Occupation: disability  . Occupation: Therapist, sports  Social Needs  . Financial resource strain: Not on file  . Food insecurity:    Worry: Not on file    Inability: Not on file  . Transportation needs:    Medical: Not on file    Non-medical: Not on file  Tobacco Use  .  Smoking status: Never Smoker  . Smokeless tobacco: Never Used  Substance and Sexual Activity  . Alcohol use: Yes    Comment: socially   . Drug use: No  . Sexual activity: Never  Lifestyle  . Physical activity:    Days per week: Not on file    Minutes per session: Not on file  . Stress: Not on file  Relationships  . Social connections:    Talks on phone: Not on file    Gets together: Not on file    Attends religious service: Not on file    Active member of club or organization: Not on file    Attends meetings of clubs or organizations: Not on file    Relationship status: Not on file  . Intimate partner violence:    Fear of current or ex partner: Not on file    Emotionally abused: Not on file    Physically abused: Not on file    Forced sexual activity: Not on file  Other Topics Concern  . Not on file  Social History Narrative   Lives at home alone   Right-handed   Drinks 1 or less cups of coffee and 2 servings of either tea or soda per day   Environmental History: The patient lives in a 65 year old house with carpeting throughout and central air/heat.  There is no known mold/water damage in the home.  She is a non-smoker without pets.  Allergies as of 05/20/2018      Reactions   Sulfa Antibiotics Hives, Itching   Flushing also   Cymbalta [duloxetine Hcl] Other (See Comments)   Caused a manic reaction   Demerol Hives, Other (See Comments)   Fever also   Ivp Dye [iodinated Diagnostic Agents] Hives, Other (See Comments)   Fever also   Morphine And Related Other (See Comments)   ineffective   Sulfasalazine Other (See Comments)   Reaction ??   Adhesive [tape] Rash      Medication List        Accurate as of 05/20/18 10:48 PM. Always use your most recent med list.          aspirin EC 81 MG tablet Take 81 mg by mouth at bedtime.   atorvastatin 20 MG tablet Commonly known as:  LIPITOR Take 1 tablet (20 mg total) by mouth daily.   carbamazepine 200 MG  tablet Commonly known as:  TEGRETOL Take 1 tablet (200 mg total) by mouth at bedtime.   cycloSPORINE 0.05 % ophthalmic emulsion Commonly known as:  RESTASIS Place 2 drops into both eyes 2 (two) times daily.   diphenoxylate-atropine  2.5-0.025 MG tablet Commonly known as:  LOMOTIL Take 1 tablet by mouth 4 (four) times daily as needed for diarrhea or loose stools.   famotidine 20 MG tablet Commonly known as:  PEPCID Take 1 tablet (20 mg total) by mouth 2 (two) times daily.   fluticasone 50 MCG/ACT nasal spray Commonly known as:  FLONASE Use 1 spray per nostril 1-2 times daily as needed   furosemide 20 MG tablet Commonly known as:  LASIX Take 20 mg by mouth as needed (60-'80mg'$  as needed).   gabapentin 300 MG capsule Commonly known as:  NEURONTIN TAKE 2 CAPSULES IN THE MORNING AND 3 CAPSULES AT BEDTIME   HYDROcodone-acetaminophen 5-325 MG tablet Commonly known as:  NORCO/VICODIN TAKE 1 TABLET BY MOUTH AT BEDTIME AS NEEDED.   hydroxychloroquine 200 MG tablet Commonly known as:  PLAQUENIL Take 200 mg by mouth See admin instructions. 200 mg by mouth two times a day on Mon/Tues/Wed/Thurs/Fri only   hydroxychloroquine 200 MG tablet Commonly known as:  PLAQUENIL TAKE 1 TABLET BY MOUTH TWICE A DAY MONDAY THROUGH FRIDAY.   ibuprofen 400 MG tablet Commonly known as:  ADVIL,MOTRIN Take 1 tablet (400 mg total) by mouth every 6 (six) hours as needed.   leflunomide 20 MG tablet Commonly known as:  ARAVA TAKE 1 TABLET BY MOUTH DAILY.   levocetirizine 5 MG tablet Commonly known as:  XYZAL Take 1 tablet (5 mg total) by mouth every evening.   lidocaine 5 % Commonly known as:  LIDODERM Place 1 patch onto the skin daily as needed (for pain). Remove & Discard patch within 12 hours or as directed by MD   metolazone 2.5 MG tablet Commonly known as:  ZAROXOLYN Take 2.5 mg by mouth daily as needed.   metoprolol succinate 25 MG 24 hr tablet Commonly known as:  TOPROL-XL TAKE 1 TABLET  BY MOUTH DAILY.   multivitamin with minerals Tabs tablet Take 1 tablet by mouth daily.   NONFORMULARY OR COMPOUNDED ITEM Triamcinolone 0.1% & Silvadene cream 1:1- Apply as directed to affected areas   potassium chloride 10 MEQ tablet Commonly known as:  K-DUR Take 6 tablets (60 mEq total) by mouth every morning AND 4 tablets (40 mEq total) daily with lunch AND 4 tablets (40 mEq total) every evening.   promethazine 25 MG tablet Commonly known as:  PHENERGAN TAKE 1 TABLET BY MOUTH EVERY 6 HOURS AS NEEDED.   silver sulfADIAZINE 1 % cream Commonly known as:  SILVADENE Apply 1 application topically daily as needed (for skin infection/irritation.).   spironolactone 25 MG tablet Commonly known as:  ALDACTONE Take 25 mg by mouth 2 (two) times daily.   tiZANidine 4 MG tablet Commonly known as:  ZANAFLEX TAKE 1 TABLET BY MOUTH DAILY AT BEDTIME   torsemide 20 MG tablet Commonly known as:  DEMADEX Take 3 tablets (60 mg total) by mouth 2 (two) times daily.   ULTRAFLORA IMMUNE HEALTH 170 MG Caps Take 1 capsule by mouth every morning.   RESTORA PO Take by mouth.   UNABLE TO FIND CPAP: AT bedtime; setting is "12"   Vitamin D (Ergocalciferol) 50000 units Caps capsule Commonly known as:  DRISDOL Take 50,000 Units by mouth every Tuesday.       Known medication allergies: Allergies  Allergen Reactions  . Sulfa Antibiotics Hives and Itching    Flushing also  . Cymbalta [Duloxetine Hcl] Other (See Comments)    Caused a manic reaction  . Demerol Hives and Other (See Comments)  Fever also  . Ivp Dye [Iodinated Diagnostic Agents] Hives and Other (See Comments)    Fever also  . Morphine And Related Other (See Comments)    ineffective  . Sulfasalazine Other (See Comments)    Reaction ??  . Adhesive [Tape] Rash    I appreciate the opportunity to take part in [name]'s care. Please do not hesitate to contact me with questions.  Sincerely,   R. Edgar Frisk, MD

## 2018-05-20 NOTE — Assessment & Plan Note (Signed)
Perennial and seasonal aeroallergens were negative today despite a positive histamine control.  A prescription has been provided for fluticasone nasal spray, one spray per nostril 1-2 times daily as needed. Proper nasal spray technique has been discussed and demonstrated.  Nasal saline spray (i.e. Simply Saline) is recommended prior to medicated nasal sprays and as needed.

## 2018-05-22 LAB — H. PYLORI BREATH TEST: H pylori Breath Test: NEGATIVE

## 2018-05-24 LAB — ALPHA-GAL PANEL
Alpha Gal IgE*: <0.1 kU/L (ref <0.10)
Beef (Bos spp) IgE: <0.1 kU/L (ref <0.35)
Class Interpretation: 0
Class Interpretation: 0
Class Interpretation: 0
Lamb/Mutton (Ovis spp) IgE: <0.1 kU/L (ref <0.35)
Pork (Sus spp) IgE: <0.1 kU/L (ref <0.35)

## 2018-05-24 LAB — CHRONIC URTICARIA: cu index: 2.6 (ref <10)

## 2018-05-24 LAB — CBC WITH DIFFERENTIAL/PLATELET
Basophils Absolute: 0 10*3/uL (ref 0.0–0.2)
Basos: 0 %
EOS (ABSOLUTE): 0.1 10*3/uL (ref 0.0–0.4)
Eos: 1 %
Hematocrit: 42.2 % (ref 34.0–46.6)
Hemoglobin: 13.8 g/dL (ref 11.1–15.9)
Immature Grans (Abs): 0.1 10*3/uL (ref 0.0–0.1)
Immature Granulocytes: 1 %
Lymphocytes Absolute: 0.6 10*3/uL — ABNORMAL LOW (ref 0.7–3.1)
Lymphs: 9 %
MCH: 28 pg (ref 26.6–33.0)
MCHC: 32.7 g/dL (ref 31.5–35.7)
MCV: 86 fL (ref 79–97)
Monocytes Absolute: 0.3 10*3/uL (ref 0.1–0.9)
Monocytes: 4 %
Neutrophils Absolute: 6.1 10*3/uL (ref 1.4–7.0)
Neutrophils: 85 %
Platelets: 187 10*3/uL (ref 150–450)
RBC: 4.93 x10E6/uL (ref 3.77–5.28)
RDW: 13 % (ref 12.3–15.4)
WBC: 7.1 10*3/uL (ref 3.4–10.8)

## 2018-05-24 LAB — THYROGLOBULIN ANTIBODY: Thyroglobulin Antibody: <1 IU/mL (ref 0.0–0.9)

## 2018-05-24 LAB — THYROID PEROXIDASE ANTIBODY: Thyroperoxidase Ab SerPl-aCnc: 6 IU/mL (ref 0–34)

## 2018-05-24 LAB — SEDIMENTATION RATE: Sed Rate: 54 mm/hr — ABNORMAL HIGH (ref 0–40)

## 2018-05-24 LAB — TRYPTASE: Tryptase: 2.8 ug/L (ref 2.2–13.2)

## 2018-05-24 LAB — ANA: Anti Nuclear Antibody(ANA): NEGATIVE

## 2018-05-27 ENCOUNTER — Ambulatory Visit: Payer: PPO | Admitting: Cardiology

## 2018-06-19 DIAGNOSIS — M419 Scoliosis, unspecified: Secondary | ICD-10-CM | POA: Diagnosis not present

## 2018-06-19 DIAGNOSIS — S32029A Unspecified fracture of second lumbar vertebra, initial encounter for closed fracture: Secondary | ICD-10-CM | POA: Diagnosis not present

## 2018-06-19 DIAGNOSIS — S73102A Unspecified sprain of left hip, initial encounter: Secondary | ICD-10-CM | POA: Diagnosis not present

## 2018-06-19 DIAGNOSIS — R937 Abnormal findings on diagnostic imaging of other parts of musculoskeletal system: Secondary | ICD-10-CM | POA: Diagnosis not present

## 2018-06-19 DIAGNOSIS — M542 Cervicalgia: Secondary | ICD-10-CM | POA: Diagnosis not present

## 2018-06-19 DIAGNOSIS — S79912A Unspecified injury of left hip, initial encounter: Secondary | ICD-10-CM | POA: Diagnosis not present

## 2018-06-19 DIAGNOSIS — S3992XA Unspecified injury of lower back, initial encounter: Secondary | ICD-10-CM | POA: Diagnosis not present

## 2018-06-19 DIAGNOSIS — I5030 Unspecified diastolic (congestive) heart failure: Secondary | ICD-10-CM | POA: Diagnosis not present

## 2018-06-19 DIAGNOSIS — Z9989 Dependence on other enabling machines and devices: Secondary | ICD-10-CM | POA: Diagnosis not present

## 2018-06-20 DIAGNOSIS — M542 Cervicalgia: Secondary | ICD-10-CM | POA: Diagnosis not present

## 2018-06-20 DIAGNOSIS — R937 Abnormal findings on diagnostic imaging of other parts of musculoskeletal system: Secondary | ICD-10-CM | POA: Diagnosis not present

## 2018-06-24 ENCOUNTER — Other Ambulatory Visit: Payer: Self-pay | Admitting: Physician Assistant

## 2018-06-24 DIAGNOSIS — Z5181 Encounter for therapeutic drug level monitoring: Secondary | ICD-10-CM

## 2018-06-24 NOTE — Telephone Encounter (Signed)
Last Visit: 02/05/18 Next visit: 07/16/18 UDS: 02/05/18 Narc Agreement: 02/05/18  Okay to refill Hydrocodone?

## 2018-06-24 NOTE — Telephone Encounter (Signed)
Pt needs to update UDS and narcotic agreement.

## 2018-06-25 NOTE — Telephone Encounter (Signed)
Attempted to contact the patient and left message for patient to call the office.  

## 2018-06-25 NOTE — Telephone Encounter (Signed)
Patient updating UDS and narcotic agreement 06/26/18

## 2018-06-25 NOTE — Telephone Encounter (Signed)
Ok to send refill once UDS is updated.

## 2018-07-02 NOTE — Progress Notes (Signed)
Office Visit Note  Patient: [name]             Date of Birth: May 22, 1951           MRN: [id number]             PCP: Mellody Dance, DO Referring: Mellody Dance, DO Visit Date: 07/16/2018 Occupation: @GUAROCC @  Subjective:  Medication management.    History of Present Illness: [name] is a 67 y.o. female  with seropositive rheumatoid arthritis, Sjogren's and osteoarthritis.  Patient reports that she went to [place] about a month ago where she had a fall and she landed up on her back.  She states she was seen by an orthopedic surgeon after that and was told that she had some vertebral fracture in her lumbar spine.  She also had left knee joint pain and discomfort.  The left knee joint continues to hurt.  She states when she came back she saw Dr. Charlestine Night PA who gave her a prednisone taper which helped her with the joint pain and discomfort.  She states currently she is doing well without any joint pain.  She continues to have sicca symptoms with dry mouth and watering of her eyes.  She states her hands and stay cold.  Activities of Daily Living:  Patient reports morning stiffness for 20 minutes.   Patient Reports nocturnal pain.  Difficulty dressing/grooming: Reports Difficulty climbing stairs: Reports Difficulty getting out of chair: Denies Difficulty using hands for taps, buttons, cutlery, and/or writing: Denies  Review of Systems  Constitutional: Negative for fatigue, night sweats, weight gain and weight loss.  HENT: Positive for mouth dryness. Negative for mouth sores, trouble swallowing, trouble swallowing and nose dryness.   Eyes: Positive for dryness. Negative for pain, redness and visual disturbance.  Respiratory: Negative for cough, shortness of breath and difficulty breathing.   Cardiovascular: Positive for palpitations. Negative for chest pain, hypertension, irregular heartbeat and swelling in legs/feet.  Gastrointestinal: Positive for diarrhea. Negative  for blood in stool and constipation.  Endocrine: Negative for increased urination.  Genitourinary: Negative for vaginal dryness.  Musculoskeletal: Positive for arthralgias, joint pain, myalgias, morning stiffness and myalgias. Negative for joint swelling, muscle weakness and muscle tenderness.  Skin: Positive for color change. Negative for rash, hair loss, skin tightness, ulcers and sensitivity to sunlight.  Allergic/Immunologic: Negative for susceptible to infections.  Neurological: Negative for dizziness, memory loss, night sweats and weakness.  Hematological: Negative for swollen glands.  Psychiatric/Behavioral: Positive for sleep disturbance. Negative for depressed mood. The patient is not nervous/anxious.     PMFS History:  Patient Active Problem List   Diagnosis Date Noted  . Recurrent urticaria 05/20/2018  . Rhinitis 05/20/2018  . Vitamin D deficiency 11/20/2017  . Mixed hyperlipidemia 11/20/2017  . Muscle cramps 11/20/2017  . Family history of coronary arteriosclerosis- strong fam h/o CAD and early CAD.  07/17/2017  . Raynaud's disease without gangrene 03/12/2017  . Abnormal weight gain 02/06/2017  . Shingles outbreak 02/06/2017  . Cyst (solitary) of breast, left 01/31/2017  . Eosinophilic esophagitis 93/81/0175  . History of hyperlipidemia 10/04/2016  . 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 History of diabetes mellitus 09/09/2016  . History of diverticulitis 09/03/2016  . Osteoporosis 09/03/2016  . Autoimmune disease ([place]) 09/02/2016  . High risk medication use 09/02/2016  . History of esophagitis 09/02/2016  . Elevated triglycerides with high cholesterol 08/23/2016  . Low serum HDL 08/23/2016  . Breast cyst, left 06/14/2016  . Encounter for  wellness examination 05/23/2016  . Abnormality of gait 05/09/2016  . Hypokalemia 03/22/2016  . Sjoegren syndrome 02/29/2016  . On potassium wasting diuretic therapy 02/29/2016  . GERD (gastroesophageal reflux disease)  01/19/2016  . Glucose intolerance (impaired glucose tolerance) 01/19/2016  . Chronic diastolic CHF (congestive heart failure) ([place]) 04/20/2015  . Peripheral polyneuropathy 02/02/2014  . Morbid obesity ([place]) 11/17/2013  . Heart murmur   . OSA (obstructive sleep apnea)   . Rheumatoid arthritis ([place])   . Hiatal hernia   . Fibromyalgia   . PVC's (premature ventricular contractions)   . 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 Unilateral primary osteoarthritis, left knee 06/22/2011    Past Medical History:  Diagnosis Date  . Arthritis    Rheumatoid arthritis,   . Blood transfusion    1981  . Chronic diastolic CHF (congestive heart failure) (HCC)    diastolic   . Fibromyalgia   . Heart murmur    as a child  . Hiatal hernia    sjorgens syndrome  . OSA (obstructive sleep apnea)   . Peripheral autonomic neuropathy of unknown cause   . Urticaria     Family History  Problem Relation Age of Onset  . Alzheimer's disease Mother   . Heart attack Mother   . Hypertension Mother   . Glaucoma Mother   . Cancer Father   . Heart attack Brother   . Heart disease Brother   . Glaucoma Brother   . Hyperlipidemia Brother   . Glaucoma Brother   . Asthma Son   . Allergic rhinitis Neg Hx   . Angioedema Neg Hx   . Eczema Neg Hx   . Urticaria Neg Hx    Past Surgical History:  Procedure Laterality Date  . ABDOMINAL HYSTERECTOMY     BTL, BSO  . ADENOIDECTOMY    . BREAST SURGERY     mass removal   . CHOLECYSTECTOMY    . DIAGNOSTIC LAPAROSCOPY     x3  . KNEE ARTHROSCOPY     x2  . MASS EXCISION Left 03/22/2017   Procedure: EXCISION OF LEFT BREAST MASS;  Surgeon: Donnie Mesa, MD;  Location: [facility];  Service: General;  Laterality: Left;  . patotid cystectomy    . TENDON REPAIR  1980   left ankle and tibia  . TONSILLECTOMY    . TOTAL KNEE ARTHROPLASTY  06/21/2011   Procedure: TOTAL KNEE ARTHROPLASTY;  Surgeon: Gearlean Alf;  Location: [facility];  Service: Orthopedics;   Laterality: Right;  . TUBAL LIGATION     Social History   Social History Narrative   Lives at home alone   Right-handed   Drinks 1 or less cups of coffee and 2 servings of either tea or soda per day    Objective: Vital Signs: BP 117/76 (BP Location: Left Arm, Patient Position: Sitting, Cuff Size: Large)   Pulse 72   Resp 16   Ht 5\' 2"  (1.575 m)   Wt 238 lb (108 kg)   BMI 43.53 kg/m    Physical Exam  Constitutional: She is oriented to person, place, and time. She appears well-developed and well-nourished.  HENT:  Head: Normocephalic and atraumatic.  Eyes: Conjunctivae and EOM are normal.  Neck: Normal range of motion.  Cardiovascular: Normal rate, regular rhythm, normal heart sounds and intact distal pulses.  Pulmonary/Chest: Effort normal and breath sounds normal.  Abdominal: Soft. Bowel sounds are normal.  Lymphadenopathy:    She has no cervical adenopathy.  Neurological: She  is alert and oriented to person, place, and time.  Skin: Skin is warm and dry. Capillary refill takes less than 2 seconds.  Psychiatric: She has a normal mood and affect. Her behavior is normal.  Nursing note and vitals reviewed.    Musculoskeletal Exam: C-spine good range of motion.  She has limited range of motion of her lumbar spine.  Shoulder joints elbow joints wrist joints with good range of motion.  She has synovial thickening over MCP and PIP joints.  No synovitis was noted.  Hip joints were in good range of motion.  Her right knee joint is replaced without any swelling.  She has some crepitus in her left knee joint.  She has generalized hyperalgesia.  CDAI Exam: CDAI Score: 0.7  Patient Global Assessment: 5 (mm); Provider Global Assessment: 2 (mm) Swollen: 0 ; Tender: 0  Joint Exam   Not documented   There is currently no information documented on the homunculus. Go to the Rheumatology activity and complete the homunculus joint exam.  Investigation: No additional  findings.  Imaging: No results found.  Recent Labs: Lab Results  Component Value Date   WBC 7.1 05/20/2018   HGB 13.8 05/20/2018   PLT 187 05/20/2018   NA 137 03/21/2018   K 3.6 03/21/2018   CL 93 (L) 03/21/2018   CO2 24 03/21/2018   GLUCOSE 134 (H) 03/21/2018   BUN 13 03/21/2018   CREATININE 1.00 03/21/2018   BILITOT 0.4 02/05/2018   ALKPHOS 122 (H) 11/26/2017   AST 18 02/05/2018   ALT 16 02/05/2018   PROT 6.2 02/05/2018   ALBUMIN 4.2 11/26/2017   CALCIUM 9.5 03/21/2018   GFRAA 68 03/21/2018    Speciality Comments: PLQ Eye Exam: 10/16/17 WNL @ [facility] follow up in 1 year  Procedures:  No procedures performed Allergies: Sulfa antibiotics; Cymbalta [duloxetine hcl]; Demerol; Ivp dye [iodinated diagnostic agents]; Morphine and related; Sulfasalazine; and Adhesive [tape]   Assessment / Plan:     Visit Diagnoses: Rheumatoid arthritis involving multiple sites with positive rheumatoid factor (HCC) - Positive RF, positive ANA.  Her arthritis seems to be quite well controlled currently.  She is on Plaquenil and Arava combination.  She is currently on prednisone for exertion she will be finishing the taper soon.  She did really well on the IV Orencia which was discontinued secondary to the cost.  High risk medication use -Current regimen includes Arava 20 mg daily and Plaquenil 200 mg twice daily Monday through Friday, prednisone?.  Last PLQ eye exam normal on 10/16/17.  Most recent CBC within normal limits on 05/20/18.  Most recent BMP stable on 03/21/18.  Recommend yearly dental exams.Arava, PLQ, hydrocodone (Orencia IV discontinued due to cost), SSZ- reaction.eye exam: 10/16/2017 - Plan: Pain Mgmt, Profile 5 w/Conf, U, CBC with Differential/Platelet, COMPLETE METABOLIC PANEL WITH GFR  Pain management -patient reports that she takes hydrocodone only on PRN basis.  She needs a refill today.  We will refill after we get UDS results.. UDS: 6/25/2019Narc agreement:  02/05/2018  Sjogren's syndrome with keratoconjunctivitis sicca (HCC)-she continues to have severe sicca symptoms.  Over-the-counter products were discussed.  Raynaud's disease without gangrene-use of warm clothing during wintertime was discussed.  Unilateral primary osteoarthritis, left knee-experiencing increased left knee joint discomfort since her fall.  Has been followed by Dr. Gladstone Lighter.  History of total knee replacement, right - 2012.  Fibromyalgia  Age-related osteoporosis without current pathological fracture - Status post Forteo. Treated with Reclast infusions in the past which were  discontinued due to the cost. DXA 01/2017 showed T score of -1.8 right femoral neck   DDD (degenerative disc disease), lumbar-she has been having lower back pain as well.  She states she had some vertebral fractures on the x-ray after the fall.  I do not have that report available.  History of hyperlipidemia-weight loss diet and exercise was discussed.  History of peripheral neuropathy - she has been followed up by Dr. Jannifer Franklin.  History of diverticulitis  History of diabetes mellitus  History of depression  Eosinophilic esophagitis - history of and and Barretts esophagus  - Dr Collene Mares   History of cardiac murmur - history of PVC's   Orders: Orders Placed This Encounter  Procedures  . Pain Mgmt, Profile 5 w/Conf, U  . CBC with Differential/Platelet  . COMPLETE METABOLIC PANEL WITH GFR   No orders of the defined types were placed in this encounter.     Follow-Up Instructions: Return in about 5 months (around 12/15/2018) for Rheumatoid arthritis, Osteoarthritis, Sjogren's.   Bo Merino, MD  Note - This record has been created using Editor, commissioning.  Chart creation errors have been sought, but may not always  have been located. Such creation errors do not reflect on  the standard of medical care.

## 2018-07-04 DIAGNOSIS — M25562 Pain in left knee: Secondary | ICD-10-CM | POA: Diagnosis not present

## 2018-07-04 DIAGNOSIS — M545 Low back pain, unspecified: Secondary | ICD-10-CM | POA: Insufficient documentation

## 2018-07-04 DIAGNOSIS — M25362 Other instability, left knee: Secondary | ICD-10-CM | POA: Diagnosis not present

## 2018-07-14 DIAGNOSIS — J189 Pneumonia, unspecified organism: Secondary | ICD-10-CM

## 2018-07-14 HISTORY — DX: Pneumonia, unspecified organism: J18.9

## 2018-07-16 ENCOUNTER — Encounter: Payer: Self-pay | Admitting: Rheumatology

## 2018-07-16 ENCOUNTER — Other Ambulatory Visit: Payer: Self-pay | Admitting: Rheumatology

## 2018-07-16 ENCOUNTER — Ambulatory Visit: Payer: PPO | Admitting: Rheumatology

## 2018-07-16 VITALS — BP 117/76 | HR 72 | Resp 16 | Ht 62.0 in | Wt 238.0 lb

## 2018-07-16 DIAGNOSIS — M5136 Other intervertebral disc degeneration, lumbar region: Secondary | ICD-10-CM

## 2018-07-16 DIAGNOSIS — Z8639 Personal history of other endocrine, nutritional and metabolic disease: Secondary | ICD-10-CM | POA: Diagnosis not present

## 2018-07-16 DIAGNOSIS — R52 Pain, unspecified: Secondary | ICD-10-CM | POA: Diagnosis not present

## 2018-07-16 DIAGNOSIS — M0579 Rheumatoid arthritis with rheumatoid factor of multiple sites without organ or systems involvement: Secondary | ICD-10-CM | POA: Diagnosis not present

## 2018-07-16 DIAGNOSIS — Z8679 Personal history of other diseases of the circulatory system: Secondary | ICD-10-CM

## 2018-07-16 DIAGNOSIS — Z8669 Personal history of other diseases of the nervous system and sense organs: Secondary | ICD-10-CM | POA: Diagnosis not present

## 2018-07-16 DIAGNOSIS — I7300 Raynaud's syndrome without gangrene: Secondary | ICD-10-CM | POA: Diagnosis not present

## 2018-07-16 DIAGNOSIS — M797 Fibromyalgia: Secondary | ICD-10-CM

## 2018-07-16 DIAGNOSIS — M810 Age-related osteoporosis without current pathological fracture: Secondary | ICD-10-CM

## 2018-07-16 DIAGNOSIS — Z79899 Other long term (current) drug therapy: Secondary | ICD-10-CM | POA: Diagnosis not present

## 2018-07-16 DIAGNOSIS — Z8719 Personal history of other diseases of the digestive system: Secondary | ICD-10-CM

## 2018-07-16 DIAGNOSIS — M3501 Sicca syndrome with keratoconjunctivitis: Secondary | ICD-10-CM | POA: Diagnosis not present

## 2018-07-16 DIAGNOSIS — Z96651 Presence of right artificial knee joint: Secondary | ICD-10-CM | POA: Diagnosis not present

## 2018-07-16 DIAGNOSIS — M1712 Unilateral primary osteoarthritis, left knee: Secondary | ICD-10-CM | POA: Diagnosis not present

## 2018-07-16 DIAGNOSIS — Z8659 Personal history of other mental and behavioral disorders: Secondary | ICD-10-CM

## 2018-07-16 DIAGNOSIS — K200 Eosinophilic esophagitis: Secondary | ICD-10-CM

## 2018-07-16 MED ORDER — HYDROCODONE-ACETAMINOPHEN 5-325 MG PO TABS: 1.0000 | ORAL_TABLET | Freq: Every evening | ORAL | 0 refills | Status: DC | PRN

## 2018-07-16 NOTE — Patient Instructions (Signed)
Standing Labs We placed an order today for your standing lab work.    Please come back and get your standing labs in March and every 3 months  We have open lab Monday through Friday from 8:30-11:30 AM and 1:30-4:00 PM  at the office of Dr. Mahitha Hickling.   You may experience shorter wait times on Monday and Friday afternoons. The office is located at [address], [address], [place], [place] [address] No appointment is necessary.   Labs are drawn by [facility].  You may receive a bill from [facility] for your lab work. If you have any questions regarding directions or hours of operation,  please call [phone].   Just as a reminder please drink plenty of water prior to coming for your lab work. Thanks!  

## 2018-07-17 LAB — CBC WITH DIFFERENTIAL/PLATELET
BASOS ABS: 62 {cells}/uL (ref 0–200)
Basophils Relative: 0.9 %
Eosinophils Absolute: 110 cells/uL (ref 15–500)
Eosinophils Relative: 1.6 %
HEMATOCRIT: 41.7 % (ref 35.0–45.0)
Hemoglobin: 13.7 g/dL (ref 11.7–15.5)
LYMPHS ABS: 1283 {cells}/uL (ref 850–3900)
MCH: 28.2 pg (ref 27.0–33.0)
MCHC: 32.9 g/dL (ref 32.0–36.0)
MCV: 85.8 fL (ref 80.0–100.0)
MPV: 11.7 fL (ref 7.5–12.5)
Monocytes Relative: 8.9 %
NEUTROS PCT: 70 %
Neutro Abs: 4830 cells/uL (ref 1500–7800)
Platelets: 204 10*3/uL (ref 140–400)
RBC: 4.86 10*6/uL (ref 3.80–5.10)
RDW: 13.3 % (ref 11.0–15.0)
Total Lymphocyte: 18.6 %
WBC: 6.9 10*3/uL (ref 3.8–10.8)
WBCMIX: 614 {cells}/uL (ref 200–950)

## 2018-07-17 LAB — COMPLETE METABOLIC PANEL WITH GFR
AG RATIO: 2.1 (calc) (ref 1.0–2.5)
ALBUMIN MSPROF: 4 g/dL (ref 3.6–5.1)
ALT: 13 U/L (ref 6–29)
AST: 16 U/L (ref 10–35)
Alkaline phosphatase (APISO): 137 U/L — ABNORMAL HIGH (ref 33–130)
BUN: 12 mg/dL (ref 7–25)
CALCIUM: 9 mg/dL (ref 8.6–10.4)
CO2: 28 mmol/L (ref 20–32)
Chloride: 105 mmol/L (ref 98–110)
Creat: 0.98 mg/dL (ref 0.50–0.99)
GFR, Est African American: 70 mL/min/{1.73_m2} (ref >=60)
GFR, Est Non African American: 60 mL/min/{1.73_m2} (ref >=60)
GLOBULIN: 1.9 g/dL (ref 1.9–3.7)
Glucose, Bld: 114 mg/dL — ABNORMAL HIGH (ref 65–99)
POTASSIUM: 3.8 mmol/L (ref 3.5–5.3)
SODIUM: 141 mmol/L (ref 135–146)
Total Bilirubin: 0.4 mg/dL (ref 0.2–1.2)
Total Protein: 5.9 g/dL — ABNORMAL LOW (ref 6.1–8.1)

## 2018-07-17 LAB — PAIN MGMT, PROFILE 5 W/CONF, U
AMPHETAMINES: NEGATIVE ng/mL (ref <500)
Barbiturates: NEGATIVE ng/mL (ref <300)
Benzodiazepines: NEGATIVE ng/mL (ref <100)
COCAINE METABOLITE: NEGATIVE ng/mL (ref <150)
Creatinine: 122.3 mg/dL
Marijuana Metabolite: NEGATIVE ng/mL (ref <20)
Methadone Metabolite: NEGATIVE ng/mL (ref <100)
Opiates: NEGATIVE ng/mL (ref <100)
Oxidant: NEGATIVE ug/mL (ref <200)
Oxycodone: NEGATIVE ng/mL (ref <100)
PH: 6.35 (ref 4.5–9.0)

## 2018-07-17 NOTE — Progress Notes (Signed)
stable °

## 2018-07-18 NOTE — Progress Notes (Signed)
C/w

## 2018-07-24 ENCOUNTER — Ambulatory Visit: Payer: PPO | Admitting: Family Medicine

## 2018-08-15 ENCOUNTER — Other Ambulatory Visit: Payer: Self-pay | Admitting: Rheumatology

## 2018-08-15 ENCOUNTER — Other Ambulatory Visit: Payer: Self-pay | Admitting: Family Medicine

## 2018-08-15 ENCOUNTER — Other Ambulatory Visit: Payer: Self-pay | Admitting: Cardiology

## 2018-08-15 ENCOUNTER — Telehealth: Payer: Self-pay | Admitting: Rheumatology

## 2018-08-15 DIAGNOSIS — E782 Mixed hyperlipidemia: Secondary | ICD-10-CM

## 2018-08-15 MED ORDER — HYDROCODONE-ACETAMINOPHEN 5-325 MG PO TABS: 1.0000 | ORAL_TABLET | Freq: Every evening | ORAL | 0 refills | Status: DC | PRN

## 2018-08-15 NOTE — Telephone Encounter (Signed)
Patient request refill on Hydrocodone sent to [facility] in [place].

## 2018-08-15 NOTE — Telephone Encounter (Signed)
Last Visit: 07/16/18 Next Visit: 12/17/18 UDS: 07/16/18 Narc agreement: 07/16/18  Okay to refill Hydrocodone?

## 2018-08-15 NOTE — Telephone Encounter (Signed)
Last Visit: 07/16/18 Next Visit: 12/17/18 Labs: 07/16/18 stable PLQ Eye Exam: 10/16/17 WNL  Okay to refill per Dr. Estanislado Pandy

## 2018-08-20 ENCOUNTER — Encounter: Payer: Self-pay | Admitting: Family Medicine

## 2018-08-20 ENCOUNTER — Ambulatory Visit (INDEPENDENT_AMBULATORY_CARE_PROVIDER_SITE_OTHER): Payer: PPO | Admitting: Family Medicine

## 2018-08-20 DIAGNOSIS — I10 Essential (primary) hypertension: Secondary | ICD-10-CM | POA: Diagnosis not present

## 2018-08-20 DIAGNOSIS — R748 Abnormal levels of other serum enzymes: Secondary | ICD-10-CM | POA: Diagnosis not present

## 2018-08-20 DIAGNOSIS — Z79899 Other long term (current) drug therapy: Secondary | ICD-10-CM

## 2018-08-20 DIAGNOSIS — Z8639 Personal history of other endocrine, nutritional and metabolic disease: Secondary | ICD-10-CM | POA: Diagnosis not present

## 2018-08-20 DIAGNOSIS — E559 Vitamin D deficiency, unspecified: Secondary | ICD-10-CM

## 2018-08-20 DIAGNOSIS — M797 Fibromyalgia: Secondary | ICD-10-CM

## 2018-08-20 DIAGNOSIS — E782 Mixed hyperlipidemia: Secondary | ICD-10-CM | POA: Diagnosis not present

## 2018-08-20 DIAGNOSIS — I5032 Chronic diastolic (congestive) heart failure: Secondary | ICD-10-CM

## 2018-08-20 DIAGNOSIS — R7302 Impaired glucose tolerance (oral): Secondary | ICD-10-CM | POA: Diagnosis not present

## 2018-08-20 MED ORDER — SPIRONOLACTONE 25 MG PO TABS: 25.0000 mg | ORAL_TABLET | Freq: Two times a day (BID) | ORAL | 1 refills | Status: DC

## 2018-08-20 NOTE — Progress Notes (Signed)
Impression and Recommendations:    1. elevated BMI    2. Chronic diastolic CHF (congestive heart failure) (HCC)   3. Elevated triglycerides with high cholesterol   4. Low serum HDL   5. Fibromyalgia   6. Glucose intolerance (impaired glucose tolerance)   7. On potassium wasting diuretic therapy   8. Hypertension, unspecified type   9. History of diabetes mellitus   10. Vitamin D deficiency   11. High risk medication use     1. Chronic Diastolic CHF - On Potassium Wasting Diuretic Therapy - Reviewed critical importance of patient following up with all specialists as recommended, especially cardiology for management of her chronic diastolic CHF.  2. Hypertension - Spironolactone refilled today.  Formerly provided by nephrologist, who patient is no longer seeing.  - Stable at this time. - Continue management as prescribed.  See med list below. - Patient tolerating meds well without complication.  Denies S-E  3. Elevated Triglycerides with High Cholesterol, Low Serum HDL - Lipid Panel Triglycerides = 263, elevated. HDL = 47 LDL = 43  - Dietary changes such as low saturated & trans fat and low carb/ ketogenic diets discussed with patient.  Encouraged regular exercise and weight loss when appropriate.   4. Kidney Function - Stable at this time. - Serum creatinine was 0.98 most recently.  - Continue management as prescribed.  No changes made today.  - Will continue to monitor.   5. BMI Counseling - BMI of 42.80 Explained to patient what BMI refers to, and what it means medically.    Told patient to think about it as a "medical risk stratification measurement" and how increasing BMI is associated with increasing risk/ or worsening state of various diseases such as hypertension, hyperlipidemia, diabetes, premature OA, depression etc.  American Heart Association guidelines for healthy diet, basically Mediterranean diet, and exercise guidelines of 30 minutes 5 days per week  or more discussed in detail.  Health counseling performed.  All questions answered.  6. Lifestyle & Preventative Health Maintenance - Advised patient to continue working toward exercising to improve overall mental, physical, and emotional health.    - Reviewed the "spokes of the wheel" of mood and health management.  Stressed the importance of ongoing prudent habits, including regular exercise, appropriate sleep hygiene, healthful dietary habits, and prayer/meditation to relax.  - Encouraged patient to engage in daily physical activity, especially a formal exercise routine.  Recommended that the patient eventually strive for at least 150 minutes of moderate cardiovascular activity per week according to guidelines established by the [facility].   - Healthy dietary habits encouraged, including low-carb, and high amounts of lean protein in diet.   - Patient should also consume adequate amounts of water, per recommendations of cardiology.    Orders Placed This Encounter  Procedures  . Lipid panel  . Hemoglobin A1c  . Comprehensive metabolic panel  . CBC with Differential/Platelet  . Magnesium  . Phosphorus  . TSH  . VITAMIN D 25 Hydroxy (Vit-D Deficiency, Fractures)    Meds ordered this encounter  Medications  . spironolactone (ALDACTONE) 25 MG tablet    Sig: Take 1 tablet (25 mg total) by mouth 2 (two) times daily.    Dispense:  90 tablet    Refill:  1    Medications Discontinued During This Encounter  Medication Reason  . Probiotic Product (Branch) 170 MG CAPS Patient Preference  . potassium chloride (K-DUR) 10 MEQ tablet   . Probiotic  Product (RESTORA PO) Patient Preference  . famotidine (PEPCID) 20 MG tablet Patient Preference  . predniSONE (DELTASONE) 10 MG tablet Completed Course  . spironolactone (ALDACTONE) 25 MG tablet Reorder     Gross side effects, risk and benefits, and alternatives of medications and treatment plan in general discussed with patient.   Patient is aware that all medications have potential side effects and we are unable to predict every side effect or drug-drug interaction that may occur.   Patient will call with any questions prior to using medication if they have concerns.    Expresses verbal understanding and consents to current therapy and treatment regimen.  No barriers to understanding were identified.  Red flag symptoms and signs discussed in detail.  Patient expressed understanding regarding what to do in case of emergency\urgent symptoms  Please see AVS handed out to patient at the end of our visit for further patient instructions/ counseling done pertaining to today's office visit.   Return for near future for medicare wellness.     Note:  This note was prepared with assistance of Dragon voice recognition software. Occasional wrong-word or sound-a-like substitutions may have occurred due to the inherent limitations of voice recognition software.   This document serves as a record of services personally performed by Mellody Dance, DO. It was created on her behalf by [name], a trained medical scribe. The creation of this record is based on the scribe's personal observations and the provider's statements to them.   I have reviewed the above medical documentation for accuracy and completeness and I concur.  Mellody Dance, DO 08/21/2018 9:18 PM       -------------------------------------------------------------------------------------------------------------------------------------   Subjective:     HPI: ERSEL WADLEIGH is a 68 y.o. female who presents to [place] at [facility] today for issues as discussed below.  She was sick over Christmas.  States she had a fever of 102 and aching all over.  States she still coughs up a lot of mucus.  "I have a lot of trouble with mucus."  Recent Fall Was in [place], at [place], in November.  Notes that she had a really bad fall.  Broke  vertebrae, did something to her hip.  There was some water on the floor of the condo in the middle of the floor.  She had to go to the ER in the ambulance "on the board and everything."  She has been following up with an orthopedist since, Dr. Gladstone Lighter.  States "I'm doing ok mostly."  She's now walking without the walker.  Feels everything is slowly improving.  Mood Denies issues with her mood.  Feels she sleeps "off and on," gets up in the middle of the night to use the bathroom, "It's just me, nothing to write home about."  Cardiovascular Sx - Followed by Cardiology Feels that her LE swelling is doing better.  States her potassium is holding.  She returns to cardiology Thursday for a check-up/ modify med dose if needed.   Patient discusses how many diuretics she's taken in the past.  Says she was recently dismissed by the kidney doctor.  Denies symptoms of congestive heart failure or fluid overload.  States that she sometimes has trouble breathing while laying flat, but these symptoms have been occurring for many months- nothing acute.  She elevates the head of her bed for personal comfort.  States "I'm always elevated on my sleep number."  Denies SOB, denies more SOB with activity than usual.  1.  68 y.o. female here for cholesterol follow-up.   - Patient reports good compliance with medications or treatment plan  - Denies medication S-E   - Smoking Status noted   - She denies new onset of: chest pain, exercise intolerance, shortness of breath, dizziness, visual changes, headache, lower extremity swelling or claudication.   Denies myalgias  The cholesterol last visit was:  Lab Results  Component Value Date   CHOL 129 08/20/2018   HDL 45 08/20/2018   LDLCALC 37 08/20/2018   TRIG 233 (H) 08/20/2018   CHOLHDL 2.9 08/20/2018    Hepatic Function Latest Ref Rng & Units 08/20/2018 07/16/2018 02/05/2018  Total Protein 6.0 - 8.5 g/dL 6.5 5.9(L) 6.2  Albumin 3.6 - 4.8 g/dL 4.0 - -  AST  0 - 40 IU/L '18 16 18  '$ ALT 0 - 32 IU/L '14 13 16  '$ Alk Phosphatase 39 - 117 IU/L 110 - -  Total Bilirubin 0.0 - 1.2 mg/dL 0.2 0.4 0.4  Bilirubin, Direct 0.0 - 0.3 mg/dL - - -     Wt Readings from Last 3 Encounters:  08/20/18 234 lb (106.1 kg)  07/16/18 238 lb (108 kg)  05/20/18 243 lb 6.4 oz (110.4 kg)   BP Readings from Last 3 Encounters:  08/20/18 126/78  07/16/18 117/76  05/20/18 128/80   Pulse Readings from Last 3 Encounters:  08/20/18 71  07/16/18 72  05/20/18 86   BMI Readings from Last 3 Encounters:  08/20/18 42.80 kg/m  07/16/18 43.53 kg/m  05/20/18 44.52 kg/m     Patient Care Team    Relationship Specialty Notifications Start End  Mellody Dance, DO PCP - General Family Medicine  01/19/16   Bo Merino, MD Consulting Physician Rheumatology  02/02/14   Sueanne Margarita, MD Consulting Physician Cardiology  05/23/16   Gaynelle Arabian, MD Consulting Physician Orthopedic Surgery  05/23/16   Kathrynn Ducking, MD Consulting Physician Neurology  05/23/16   Juanita Craver, MD Consulting Physician Gastroenterology  05/23/16   Martinique, Amy, MD Consulting Physician Dermatology  05/23/16   Corliss Parish, MD Consulting Physician Nephrology  07/02/17    Comment: Seen for hypokalemia.  Bobbitt, Sedalia Muta, MD Consulting Physician Allergy and Immunology  08/20/18      Patient Active Problem List   Diagnosis Date Noted  . Elevated triglycerides with high cholesterol 08/23/2016    Priority: High  . Peripheral polyneuropathy 02/02/2014    Priority: High  . Morbid obesity ([place]) 11/17/2013    Priority: High  . OSA (obstructive sleep apnea)     Priority: High  . Low serum HDL 08/23/2016    Priority: Medium  . GERD (gastroesophageal reflux disease) 01/19/2016    Priority: Medium  . Fibromyalgia     Priority: Medium  . Sjoegren syndrome 02/29/2016    Priority: Low  . On potassium wasting diuretic therapy 02/29/2016    Priority: Low  . Glucose intolerance  (impaired glucose tolerance) 01/19/2016    Priority: Low  . Chronic diastolic CHF (congestive heart failure) ([place]) 04/20/2015    Priority: Low  . Rheumatoid arthritis ([place])     Priority: Low  . Hypertension 08/20/2018  . Lumbar pain 07/04/2018  . Recurrent urticaria 05/20/2018  . Rhinitis 05/20/2018  . Vitamin D deficiency 11/20/2017  . Mixed hyperlipidemia 11/20/2017  . Muscle cramps 11/20/2017  . Family history of coronary arteriosclerosis- strong fam h/o CAD and early CAD.  07/17/2017  . Raynaud's disease without gangrene 03/12/2017  . Abnormal weight gain 02/06/2017  .  Shingles outbreak 02/06/2017  . Cyst (solitary) of breast, left 01/31/2017  . Eosinophilic esophagitis 16/08/930  . History of hyperlipidemia 10/04/2016  . 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 History of diabetes mellitus 09/09/2016  . History of diverticulitis 09/03/2016  . Osteoporosis 09/03/2016  . Autoimmune disease ([place]) 09/02/2016  . High risk medication use 09/02/2016  . History of esophagitis 09/02/2016  . Breast cyst, left 06/14/2016  . Encounter for wellness examination 05/23/2016  . Abnormality of gait 05/09/2016  . Hypokalemia 03/22/2016  . Heart murmur   . Hiatal hernia   . PVC's (premature ventricular contractions)   . 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 Unilateral primary osteoarthritis, left knee 06/22/2011    Past Medical history, Surgical history, Family history, Social history, Allergies and Medications have been entered into the medical record, reviewed and changed as needed.    Current Meds  Medication Sig  . aspirin EC 81 MG tablet Take 81 mg by mouth at bedtime.  [name] atorvastatin (LIPITOR) 20 MG tablet TAKE 1 TABLET BY MOUTH DAILY.  . carbamazepine (TEGRETOL) 200 MG tablet Take 1 tablet (200 mg total) by mouth at bedtime.  . cycloSPORINE (RESTASIS) 0.05 % ophthalmic emulsion Place 2 drops into both eyes 2 (two) times daily.   . diphenoxylate-atropine  (LOMOTIL) 2.5-0.025 MG per tablet Take 1 tablet by mouth 4 (four) times daily as needed for diarrhea or loose stools.  . fluticasone (FLONASE) 50 MCG/ACT nasal spray Use 1 spray per nostril 1-2 times daily as needed  . furosemide (LASIX) 20 MG tablet Take 20 mg by mouth as needed (60-'80mg'$  as needed).  . gabapentin (NEURONTIN) 300 MG capsule TAKE 2 CAPSULES IN THE MORNING AND 3 CAPSULES AT BEDTIME  . HYDROcodone-acetaminophen (NORCO/VICODIN) 5-325 MG tablet Take 1 tablet by mouth at bedtime as needed.  . hydroxychloroquine (PLAQUENIL) 200 MG tablet TAKE 1 TABLET BY MOUTH TWICE A DAY MONDAY THROUGH FRIDAY.  . hyoscyamine (ANASPAZ) 0.125 MG TBDP disintergrating tablet Place 0.25 mg under the tongue.  . leflunomide (ARAVA) 20 MG tablet TAKE 1 TABLET BY MOUTH DAILY.  [name] levocetirizine (XYZAL) 5 MG tablet Take 1 tablet (5 mg total) by mouth every evening.  . lidocaine (LIDODERM) 5 % Place 1 patch onto the skin daily as needed (for pain). Remove & Discard patch within 12 hours or as directed by MD  . metolazone (ZAROXOLYN) 2.5 MG tablet Take 2.5 mg by mouth daily as needed.  . metoprolol succinate (TOPROL-XL) 25 MG 24 hr tablet TAKE 1 TABLET BY MOUTH DAILY.  . Multiple Vitamin (MULTIVITAMIN WITH MINERALS) TABS tablet Take 1 tablet by mouth daily.  . NONFORMULARY OR COMPOUNDED ITEM Triamcinolone 0.1% & Silvadene cream 1:1- Apply as directed to affected areas  . promethazine (PHENERGAN) 25 MG tablet TAKE 1 TABLET BY MOUTH EVERY 6 HOURS AS NEEDED.  [name] silver sulfADIAZINE (SILVADENE) 1 % cream Apply 1 application topically daily as needed (for skin infection/irritation.).   [name] spironolactone (ALDACTONE) 25 MG tablet Take 1 tablet (25 mg total) by mouth 2 (two) times daily.  [name] tiZANidine (ZANAFLEX) 4 MG tablet TAKE 1 TABLET BY MOUTH DAILY AT BEDTIME  . torsemide (DEMADEX) 20 MG tablet Take 3 tablets (60 mg total) by mouth 2 (two) times daily. (Patient taking differently: Take 60 mg by mouth as needed. )  .  UNABLE TO FIND CPAP: AT bedtime; setting is "12"  . Vitamin D, Ergocalciferol, (DRISDOL) 50000 units CAPS capsule Take 50,000 Units by mouth every Tuesday.   . [  DISCONTINUED] spironolactone (ALDACTONE) 25 MG tablet Take 25 mg by mouth 2 (two) times daily.    Allergies:  Allergies  Allergen Reactions  . Sulfa Antibiotics Hives and Itching    Flushing also  . Cymbalta [Duloxetine Hcl] Other (See Comments)    Caused a manic reaction  . Demerol Hives and Other (See Comments)    Fever also  . Ivp Dye [Iodinated Diagnostic Agents] Hives and Other (See Comments)    Fever also  . Morphine And Related Other (See Comments)    ineffective  . Sulfasalazine Other (See Comments)    Reaction ??  . Adhesive [Tape] Rash     Review of Systems:  A fourteen system review of systems was performed and found to be positive as per HPI.   Objective:   Blood pressure 126/78, pulse 71, temperature 98.3 F (36.8 C), height '5\' 2"'$  (1.575 m), weight 234 lb (106.1 kg), SpO2 98 %. Body mass index is 42.8 kg/m. General:  Well Developed, well nourished, appropriate for stated age.  Neuro:  Alert and oriented,  extra-ocular muscles intact  HEENT:  Normocephalic, atraumatic, neck supple, no carotid bruits appreciated  Skin:  no gross rash, warm, pink. Cardiac:  RRR, S1 S2 Respiratory:  ECTA B/L and A/P, Not using accessory muscles, speaking in full sentences- unlabored. Vascular:  Ext warm, no cyanosis apprec.; cap RF less 2 sec. Bilateral LE: Pitting edema b/l.  Psych:  No HI/SI, judgement and insight good, Euthymic mood. Full Affect.

## 2018-08-20 NOTE — Patient Instructions (Addendum)
Please realize, EXERCISE IS MEDICINE!  -  American Heart Association ( AHA) guidelines for exercise : If you are in good health, without any medical conditions, you should engage in 150-300 minutes of moderate intensity aerobic activity per week.  This means you should be huffing and puffing throughout your workout.   Engaging in regular exercise will improve brain function and memory, as well as improve mood, boost immune system and help with weight management.  As well as the other, more well-known effects of exercise such as decreasing blood sugar levels, decreasing blood pressure,  and decreasing bad cholesterol levels/ increasing good cholesterol levels.     -  The AHA strongly endorses consumption of a diet that contains a variety of foods from all the food categories with an emphasis on fruits and vegetables; fat-free and low-fat dairy products; cereal and grain products; legumes and nuts; and fish, poultry, and/or extra lean meats.    Excessive food intake, especially of foods high in saturated and trans fats, sugar, and salt, should be avoided.    Adequate water intake of roughly 1/2 of your weight in pounds, should equal the ounces of water per day you should drink.  So for instance, if you're 200 pounds, that would be 100 ounces of water per day.         Mediterranean Diet  Why follow it? Research shows. . Those who follow the Mediterranean diet have a reduced risk of heart disease  . The diet is associated with a reduced incidence of Parkinson's and Alzheimer's diseases . People following the diet may have longer life expectancies and lower rates of chronic diseases  . The Dietary Guidelines for Americans recommends the Mediterranean diet as an eating plan to promote health and prevent disease  What Is the Mediterranean Diet?  . Healthy eating plan based on typical foods and recipes of Mediterranean-style cooking . The diet is primarily a plant based diet; these foods should make up a  majority of meals   Starches - Plant based foods should make up a majority of meals - They are an important sources of vitamins, minerals, energy, antioxidants, and fiber - Choose whole grains, foods high in fiber and minimally processed items  - Typical grain sources include wheat, oats, barley, corn, brown rice, bulgar, farro, millet, polenta, couscous  - Various types of beans include chickpeas, lentils, fava beans, black beans, white beans   Fruits  Veggies - Large quantities of antioxidant rich fruits & veggies; 6 or more servings  - Vegetables can be eaten raw or lightly drizzled with oil and cooked  - Vegetables common to the traditional Mediterranean Diet include: artichokes, arugula, beets, broccoli, brussel sprouts, cabbage, carrots, celery, collard greens, cucumbers, eggplant, kale, leeks, lemons, lettuce, mushrooms, okra, onions, peas, peppers, potatoes, pumpkin, radishes, rutabaga, shallots, spinach, sweet potatoes, turnips, zucchini - Fruits common to the Mediterranean Diet include: apples, apricots, avocados, cherries, clementines, dates, figs, grapefruits, grapes, melons, nectarines, oranges, peaches, pears, pomegranates, strawberries, tangerines  Fats - Replace butter and margarine with healthy oils, such as olive oil, canola oil, and tahini  - Limit nuts to no more than a handful a day  - Nuts include walnuts, almonds, pecans, pistachios, pine nuts  - Limit or avoid candied, honey roasted or heavily salted nuts - Olives are central to the Mediterranean diet - can be eaten whole or used in a variety of dishes   Meats Protein - Limiting red meat: no more than a few times a month -   When eating red meat: choose lean cuts and keep the portion to the size of deck of cards - Eggs: approx. 0 to 4 times a week  - Fish and lean poultry: at least 2 a week  - Healthy protein sources include, chicken, [place], lean beef, lamb - Increase intake of seafood such as tuna, salmon, trout,  mackerel, shrimp, scallops - Avoid or limit high fat processed meats such as sausage and bacon  Dairy - Include moderate amounts of low fat dairy products  - Focus on healthy dairy such as fat free yogurt, skim milk, low or reduced fat cheese - Limit dairy products higher in fat such as whole or 2% milk, cheese, ice cream  Alcohol - Moderate amounts of red wine is ok  - No more than 5 oz daily for women (all ages) and men older than age 72  - No more than 10 oz of wine daily for men younger than 12  Other - Limit sweets and other desserts  - Use herbs and spices instead of salt to flavor foods  - Herbs and spices common to the traditional Mediterranean Diet include: basil, bay leaves, chives, cloves, cumin, fennel, garlic, lavender, marjoram, mint, oregano, parsley, pepper, rosemary, sage, savory, sumac, tarragon, thyme   It's not just a diet, it's a lifestyle:  . The Mediterranean diet includes lifestyle factors typical of those in the region  . Foods, drinks and meals are best eaten with others and savored . Daily physical activity is important for overall good health . This could be strenuous exercise like running and aerobics . This could also be more leisurely activities such as walking, housework, yard-work, or taking the stairs . Moderation is the key; a balanced and healthy diet accommodates most foods and drinks . Consider portion sizes and frequency of consumption of certain foods   Meal Ideas & Options:  . Breakfast:  o Whole wheat toast or whole wheat English muffins with peanut butter & hard boiled egg o Steel cut oats topped with apples & cinnamon and skim milk  o Fresh fruit: banana, strawberries, melon, berries, peaches  o Smoothies: strawberries, bananas, greek yogurt, peanut butter o Low fat greek yogurt with blueberries and granola  o Egg white omelet with spinach and mushrooms o Breakfast couscous: whole wheat couscous, apricots, skim milk, cranberries  . Sandwiches:   o Hummus and grilled vegetables (peppers, zucchini, squash) on whole wheat bread   o Grilled chicken on whole wheat pita with lettuce, tomatoes, cucumbers or tzatziki  o Tuna salad on whole wheat bread: tuna salad made with greek yogurt, olives, red peppers, capers, green onions o Garlic rosemary lamb pita: lamb sauted with garlic, rosemary, salt & pepper; add lettuce, cucumber, greek yogurt to pita - flavor with lemon juice and black pepper  . Seafood:  o Mediterranean grilled salmon, seasoned with garlic, basil, parsley, lemon juice and black pepper o Shrimp, lemon, and spinach whole-grain pasta salad made with low fat greek yogurt  o Seared scallops with lemon orzo  o Seared tuna steaks seasoned salt, pepper, coriander topped with tomato mixture of olives, tomatoes, olive oil, minced garlic, parsley, green onions and cappers  . Meats:  o Herbed greek chicken salad with kalamata olives, cucumber, feta  o Red bell peppers stuffed with spinach, bulgur, lean ground beef (or lentils) & topped with feta   o Kebabs: skewers of chicken, tomatoes, onions, zucchini, squash  o [place] burgers: made with red onions, mint, dill, lemon juice, feta  cheese topped with roasted red peppers . Vegetarian o Cucumber salad: cucumbers, artichoke hearts, celery, red onion, feta cheese, tossed in olive oil & lemon juice  o Hummus and whole grain pita points with a greek salad (lettuce, tomato, feta, olives, cucumbers, red onion) o Lentil soup with celery, carrots made with vegetable broth, garlic, salt and pepper  o Tabouli salad: parsley, bulgur, mint, scallions, cucumbers, tomato, radishes, lemon juice, olive oil, salt and pepper.    How to Increase Your Level of Physical Activity  Getting regular physical activity is important for your overall health and well-being. Most people do not get enough exercise. There are easy ways to increase your level of physical activity, even if you have not been very active in  the past or you are just starting out. Why is physical activity important? Physical activity has many short-term and long-term health benefits. Regular exercise can:  Help you lose weight or maintain a healthy weight.  Strengthen your muscles and bones.  Boost your mood and improve self-esteem.  Reduce your risk of certain long-term (chronic) diseases, like heart disease, cancer, and diabetes.  Help you stay capable of walking and moving around (mobile) as you age.  Prevent accidents, such as falls, as you age.  Increase life expectancy.  What are the benefits of being physically active on a regular basis? In addition to improving your physical health, being physically active on most days of the week can help you in ways that you may not expect. Benefits of regular physical activity may include:  Feeling good about your body.  Being able to move around more easily and for longer periods of time without getting tired (increased stamina).  Finding new sources of fun and enjoyment.  Meeting new people who share a common interest.  Being able to fight off illness better (enhanced immunity).  Being able to sleep better.  What can happen if I am not physically active on a regular basis? Not getting enough physical activity can lead to an unhealthy lifestyle and future health problems. This can increase your chances of:  Becoming overweight or obese.  Becoming sick.  Developing chronic illnesses, like heart disease or diabetes.  Having mental health problems, like depression or anxiety.  Having sleep problems.  Having trouble walking or getting yourself around (reduced mobility).  Injuring yourself in a fall as you get older.  What steps can I take to be more physically active?  Check with your health care provider about how to get started. Ask your health care provider what activities are safe for you.  Start out slowly. Walking or doing some simple chair exercises is  a good place to start, especially if you have not been active before or for a long time.  Try to find activities that you enjoy. You are more likely to commit to an exercise routine if it does not feel like a chore.  If you have bone or joint problems, choose low-impact exercises, like walking or swimming.  Include physical activity in your everyday routine.  Invite friends or family members to exercise with you. This also will help you commit to your workout plan.  Set goals that you can work toward.  Aim for at least 150 minutes of moderate-intensity exercise each week. Examples of moderate-intensity exercise include walking or riding a bike. Where to find more information:  Centers for Disease Control and Prevention: BowlingGrip.is  President's [employer] on [employer], Sports & Nutrition www.http://villegas.org/  ChooseMyPlate: WirelessM[employer]ages.dk Contact a health  care provider if:  You have headaches, muscle aches, or joint pain.  You feel dizzy or light-headed while exercising.  You faint.  You have chest pain while exercising. Summary  Exercise benefits your mind and body at any age, even if you are just starting out.  If you have a chronic illness or have not been active for a while, check with your health care provider before increasing your physical activity.  Choose activities that are safe and enjoyable for you.Ask your health care provider what activities are safe for you.  Start slowly. Tell your health care provider if you have problems as you start to increase your activity level. This information is not intended to replace advice given to you by your health care provider. Make sure you discuss any questions you have with your health care provider. Document Released: 07/20/2016 Document Revised: 07/20/2016 Document Reviewed: 07/20/2016 Elsevier Interactive Patient Education  [employer].

## 2018-08-22 ENCOUNTER — Encounter

## 2018-08-22 ENCOUNTER — Ambulatory Visit: Payer: PPO | Admitting: Cardiology

## 2018-08-22 LAB — CBC WITH DIFFERENTIAL/PLATELET
Basophils Absolute: 0.1 10*3/uL (ref 0.0–0.2)
Basos: 1 %
EOS (ABSOLUTE): 0.1 10*3/uL (ref 0.0–0.4)
EOS: 1 %
HEMATOCRIT: 39.9 % (ref 34.0–46.6)
Hemoglobin: 13.5 g/dL (ref 11.1–15.9)
Immature Grans (Abs): 0.1 10*3/uL (ref 0.0–0.1)
Immature Granulocytes: 1 %
Lymphocytes Absolute: 1.8 10*3/uL (ref 0.7–3.1)
Lymphs: 29 %
MCH: 28.4 pg (ref 26.6–33.0)
MCHC: 33.8 g/dL (ref 31.5–35.7)
MCV: 84 fL (ref 79–97)
Monocytes Absolute: 0.6 10*3/uL (ref 0.1–0.9)
Monocytes: 9 %
Neutrophils Absolute: 3.5 10*3/uL (ref 1.4–7.0)
Neutrophils: 59 %
Platelets: 266 10*3/uL (ref 150–450)
RBC: 4.76 x10E6/uL (ref 3.77–5.28)
RDW: 13.2 % (ref 11.7–15.4)
WBC: 6.1 10*3/uL (ref 3.4–10.8)

## 2018-08-22 LAB — LIPID PANEL
Chol/HDL Ratio: 2.9 ratio (ref 0.0–4.4)
Cholesterol, Total: 129 mg/dL (ref 100–199)
HDL: 45 mg/dL (ref >39)
LDL Calculated: 37 mg/dL (ref 0–99)
Triglycerides: 233 mg/dL — ABNORMAL HIGH (ref 0–149)
VLDL Cholesterol Cal: 47 mg/dL — ABNORMAL HIGH (ref 5–40)

## 2018-08-22 LAB — COMPREHENSIVE METABOLIC PANEL
ALT: 14 IU/L (ref 0–32)
AST: 18 IU/L (ref 0–40)
Albumin/Globulin Ratio: 1.6 (ref 1.2–2.2)
Albumin: 4 g/dL (ref 3.6–4.8)
Alkaline Phosphatase: 110 IU/L (ref 39–117)
BUN/Creatinine Ratio: 16 (ref 12–28)
BUN: 11 mg/dL (ref 8–27)
Bilirubin Total: 0.2 mg/dL (ref 0.0–1.2)
CO2: 23 mmol/L (ref 20–29)
Calcium: 9.2 mg/dL (ref 8.7–10.3)
Chloride: 103 mmol/L (ref 96–106)
Creatinine, Ser: 0.69 mg/dL (ref 0.57–1.00)
GFR calc Af Amer: 105 mL/min/{1.73_m2} (ref >59)
GFR calc non Af Amer: 91 mL/min/{1.73_m2} (ref >59)
GLOBULIN, TOTAL: 2.5 g/dL (ref 1.5–4.5)
Glucose: 86 mg/dL (ref 65–99)
POTASSIUM: 4.1 mmol/L (ref 3.5–5.2)
SODIUM: 143 mmol/L (ref 134–144)
Total Protein: 6.5 g/dL (ref 6.0–8.5)

## 2018-08-22 LAB — HEMOGLOBIN A1C
Est. average glucose Bld gHb Est-mCnc: 114 mg/dL
Hgb A1c MFr Bld: 5.6 % (ref 4.8–5.6)

## 2018-08-22 LAB — PHOSPHORUS: Phosphorus: 3.6 mg/dL (ref 2.5–4.5)

## 2018-08-22 LAB — MAGNESIUM: Magnesium: 2.2 mg/dL (ref 1.6–2.3)

## 2018-08-22 LAB — VITAMIN D 25 HYDROXY (VIT D DEFICIENCY, FRACTURES): Vit D, 25-Hydroxy: 72.7 ng/mL (ref 30.0–100.0)

## 2018-08-22 LAB — TSH: TSH: 1.03 u[IU]/mL (ref 0.450–4.500)

## 2018-08-23 DIAGNOSIS — M545 Low back pain: Secondary | ICD-10-CM | POA: Diagnosis not present

## 2018-08-23 DIAGNOSIS — M25562 Pain in left knee: Secondary | ICD-10-CM | POA: Diagnosis not present

## 2018-09-02 ENCOUNTER — Ambulatory Visit: Payer: PPO | Admitting: Adult Health

## 2018-09-02 ENCOUNTER — Telehealth: Payer: Self-pay | Admitting: *Deleted

## 2018-09-02 ENCOUNTER — Encounter: Payer: Self-pay | Admitting: Adult Health

## 2018-09-02 VITALS — BP 122/62 | HR 73 | Ht 62.0 in | Wt 230.8 lb

## 2018-09-02 DIAGNOSIS — G629 Polyneuropathy, unspecified: Secondary | ICD-10-CM

## 2018-09-02 MED ORDER — LIDOCAINE 5 % EX PTCH: 1.0000 | MEDICATED_PATCH | Freq: Every day | CUTANEOUS | 0 refills | Status: DC | PRN

## 2018-09-02 MED ORDER — GABAPENTIN 300 MG PO CAPS: ORAL_CAPSULE | ORAL | 3 refills | Status: DC

## 2018-09-02 MED ORDER — CARBAMAZEPINE 200 MG PO TABS: 200.0000 mg | ORAL_TABLET | Freq: Every day | ORAL | 3 refills | Status: DC

## 2018-09-02 NOTE — Progress Notes (Signed)
PATIENT: [name] DOB: 09-29-1950  REASON FOR VISIT: follow up HISTORY FROM: patient  HISTORY OF PRESENT ILLNESS: Today 09/02/18 :  [name] is a 68 year old female with a history of rheumatoid arthritis and peripheral neuropathy.  She returns today for follow-up.  She is currently taking carbamazepine and gabapentin.  She reports that this is managing her neuropathy pain well.  She states that she still has some numbness and tingling throughout the day but it is manageable.  She denies any significant changes with her gait or balance.  She did have a fall back in November while in [place].  She states that she did break some vertebrae but has seen orthopedist and has since been released.  She states that she will use a cane and walker only for long distances.  She states that she was given lidocaine patches by her rheumatologist for RA.  She reports that she is used those on her feet and legs and it is beneficial for her neuropathy.  She would like [place] to try to refill this to see if it will be approved by her insurance.  Patient also states that gabapentin seems to have decreased her libido.  She expressed that she would like to wean off this medication as long as her pain is under control.  She returns today for evaluation.  HISTORY [name] is a 68 year old right-handed white female with a history of morbid obesity and rheumatoid arthritis as well as a peripheral neuropathy.  The patient has chronic low back pain as well.  She has had lancinating pains with her neuropathy that has responded quite well to the combination of gabapentin and carbamazepine.  The patient takes 200 mg of carbamazepine at night, she is on gabapentin taking 600 mg in the morning and 900 mg in the evening.  She did have one fall on 28 January 2018.  The patient has had some right lower back pain since that time.  The patient has been sleeping fairly well.  She does not use a cane or other assistive device for  ambulation.  She returns for an evaluation.  She has had recent blood work done in April 2019.  REVIEW OF SYSTEMS: Out of a complete 14 system review of symptoms, the patient complains only of the following symptoms, and all other reviewed systems are negative.  Appetite change, fatigue, unexpected weight change, runny nose, cough, shortness of breath, chest pain, leg swelling, palpitations, apnea, diarrhea, cold intolerance, joint pain, joint swelling, back pain, aching muscles, walking difficulty, numbness, weakness  ALLERGIES: Allergies  Allergen Reactions  . Sulfa Antibiotics Hives and Itching    Flushing also  . Cymbalta [Duloxetine Hcl] Other (See Comments)    Caused a manic reaction  . Demerol Hives and Other (See Comments)    Fever also  . Ivp Dye [Iodinated Diagnostic Agents] Hives and Other (See Comments)    Fever also  . Morphine And Related Other (See Comments)    ineffective  . Sulfasalazine Other (See Comments)    Reaction ??  . Adhesive [Tape] Rash    HOME MEDICATIONS: Outpatient Medications Prior to Visit  Medication Sig Dispense Refill  . aspirin EC 81 MG tablet Take 81 mg by mouth at bedtime.    [name] atorvastatin (LIPITOR) 20 MG tablet TAKE 1 TABLET BY MOUTH DAILY. 90 tablet 1  . carbamazepine (TEGRETOL) 200 MG tablet Take 1 tablet (200 mg total) by mouth at bedtime. 90 tablet 1  . cycloSPORINE (RESTASIS)  0.05 % ophthalmic emulsion Place 2 drops into both eyes 2 (two) times daily.     . diphenoxylate-atropine (LOMOTIL) 2.5-0.025 MG per tablet Take 1 tablet by mouth 4 (four) times daily as needed for diarrhea or loose stools.    . fluticasone (FLONASE) 50 MCG/ACT nasal spray Use 1 spray per nostril 1-2 times daily as needed 16 g 5  . furosemide (LASIX) 20 MG tablet Take 20 mg by mouth as needed (60-80mg  as needed).    . gabapentin (NEURONTIN) 300 MG capsule TAKE 2 CAPSULES IN THE MORNING AND 3 CAPSULES AT BEDTIME 450 capsule 3  . HYDROcodone-acetaminophen  (NORCO/VICODIN) 5-325 MG tablet Take 1 tablet by mouth at bedtime as needed. 30 tablet 0  . hydroxychloroquine (PLAQUENIL) 200 MG tablet TAKE 1 TABLET BY MOUTH TWICE A DAY MONDAY THROUGH FRIDAY. 120 tablet 0  . hyoscyamine (ANASPAZ) 0.125 MG TBDP disintergrating tablet Place 0.25 mg under the tongue.    [name] ibuprofen (ADVIL,MOTRIN) 400 MG tablet Take 1 tablet (400 mg total) by mouth every 6 (six) hours as needed. 30 tablet 0  . leflunomide (ARAVA) 20 MG tablet TAKE 1 TABLET BY MOUTH DAILY. 90 tablet 0  . levocetirizine (XYZAL) 5 MG tablet Take 1 tablet (5 mg total) by mouth every evening. 30 tablet 5  . lidocaine (LIDODERM) 5 % Place 1 patch onto the skin daily as needed (for pain). Remove & Discard patch within 12 hours or as directed by MD 90 patch 0  . metolazone (ZAROXOLYN) 2.5 MG tablet Take 2.5 mg by mouth daily as needed.    . metoprolol succinate (TOPROL-XL) 25 MG 24 hr tablet TAKE 1 TABLET BY MOUTH DAILY. 90 tablet 0  . Multiple Vitamin (MULTIVITAMIN WITH MINERALS) TABS tablet Take 1 tablet by mouth daily.    . NONFORMULARY OR COMPOUNDED ITEM Triamcinolone 0.1% & Silvadene cream 1:1- Apply as directed to affected areas    . promethazine (PHENERGAN) 25 MG tablet TAKE 1 TABLET BY MOUTH EVERY 6 HOURS AS NEEDED. 120 tablet 2  . silver sulfADIAZINE (SILVADENE) 1 % cream Apply 1 application topically daily as needed (for skin infection/irritation.).     [name] spironolactone (ALDACTONE) 25 MG tablet Take 1 tablet (25 mg total) by mouth 2 (two) times daily. 90 tablet 1  . tiZANidine (ZANAFLEX) 4 MG tablet TAKE 1 TABLET BY MOUTH DAILY AT BEDTIME 90 tablet 0  . torsemide (DEMADEX) 20 MG tablet Take 3 tablets (60 mg total) by mouth 2 (two) times daily. (Patient taking differently: Take 60 mg by mouth as needed. ) 540 tablet 3  . UNABLE TO FIND CPAP: AT bedtime; setting is "12"    . Vitamin D, Ergocalciferol, (DRISDOL) 50000 units CAPS capsule Take 50,000 Units by mouth every Tuesday.      No  facility-administered medications prior to visit.     PAST MEDICAL HISTORY: Past Medical History:  Diagnosis Date  . Arthritis    Rheumatoid arthritis,   . Blood transfusion    1981  . Chronic diastolic CHF (congestive heart failure) (HCC)    diastolic   . Fibromyalgia   . Heart murmur    as a child  . Hiatal hernia    sjorgens syndrome  . OSA (obstructive sleep apnea)   . Peripheral autonomic neuropathy of unknown cause   . Urticaria     PAST SURGICAL HISTORY: Past Surgical History:  Procedure Laterality Date  . ABDOMINAL HYSTERECTOMY     BTL, BSO  . ADENOIDECTOMY    .  BREAST SURGERY     mass removal   . CHOLECYSTECTOMY    . DIAGNOSTIC LAPAROSCOPY     x3  . KNEE ARTHROSCOPY     x2  . MASS EXCISION Left 03/22/2017   Procedure: EXCISION OF LEFT BREAST MASS;  Surgeon: Donnie Mesa, MD;  Location: [facility];  Service: General;  Laterality: Left;  . patotid cystectomy    . TENDON REPAIR  1980   left ankle and tibia  . TONSILLECTOMY    . TOTAL KNEE ARTHROPLASTY  06/21/2011   Procedure: TOTAL KNEE ARTHROPLASTY;  Surgeon: Gearlean Alf;  Location: [facility];  Service: Orthopedics;  Laterality: Right;  . TUBAL LIGATION      FAMILY HISTORY: Family History  Problem Relation Age of Onset  . Alzheimer's disease Mother   . Heart attack Mother   . Hypertension Mother   . Glaucoma Mother   . Cancer Father   . Heart attack Brother   . Heart disease Brother   . Glaucoma Brother   . Hyperlipidemia Brother   . Glaucoma Brother   . Asthma Son   . Allergic rhinitis Neg Hx   . Angioedema Neg Hx   . Eczema Neg Hx   . Urticaria Neg Hx     SOCIAL HISTORY: Social History   Socioeconomic History  . Marital status: Widowed    Spouse name: Not on file  . Number of children: 2  . Years of education: AS  . Highest education level: Not on file  Occupational History  . Occupation: disability  . Occupation: Therapist, sports  Social Needs  . Financial resource strain: Not on file  . Food  insecurity:    Worry: Not on file    Inability: Not on file  . Transportation needs:    Medical: Not on file    Non-medical: Not on file  Tobacco Use  . Smoking status: Never Smoker  . Smokeless tobacco: Never Used  Substance and Sexual Activity  . Alcohol use: Yes    Comment: socially   . Drug use: No  . Sexual activity: Never  Lifestyle  . Physical activity:    Days per week: Not on file    Minutes per session: Not on file  . Stress: Not on file  Relationships  . Social connections:    Talks on phone: Not on file    Gets together: Not on file    Attends religious service: Not on file    Active member of club or organization: Not on file    Attends meetings of clubs or organizations: Not on file    Relationship status: Not on file  . Intimate partner violence:    Fear of current or ex partner: Not on file    Emotionally abused: Not on file    Physically abused: Not on file    Forced sexual activity: Not on file  Other Topics Concern  . Not on file  Social History Narrative   Lives at home alone   Right-handed   Drinks 1 or less cups of coffee and 2 servings of either tea or soda per day      PHYSICAL EXAM  Vitals:   09/02/18 1323  BP: 122/62  Pulse: 73  Weight: 230 lb 12.8 oz (104.7 kg)  Height: 5\' 2"  (1.575 m)   Body mass index is 42.21 kg/m.  Generalized: Well developed, in no acute distress   Neurological examination  Mentation: Alert oriented to time, place, history taking. Follows all  commands speech and language fluent Cranial nerve II-XII: Pupils were equal round reactive to light. Extraocular movements were full, visual field were full on confrontational test. Facial sensation and strength were normal. Uvula tongue midline. Head turning and shoulder shrug  were normal and symmetric. Motor: The motor testing reveals 5 over 5 strength of all 4 extremities. Good symmetric motor tone is noted throughout.  Sensory: Sensory testing is intact to soft  touch on all 4 extremities. No evidence of extinction is noted.  Coordination: Cerebellar testing reveals good finger-nose-finger and heel-to-shin bilaterally.  Gait and station: Gait is slightly wide-based.  Tandem gait not attempted. Reflexes: Deep tendon reflexes are symmetric and normal bilaterally.   DIAGNOSTIC DATA (LABS, IMAGING, TESTING) - I reviewed patient records, labs, notes, testing and imaging myself where available.  Lab Results  Component Value Date   WBC 6.1 08/20/2018   HGB 13.5 08/20/2018   HCT 39.9 08/20/2018   MCV 84 08/20/2018   PLT 266 08/20/2018      Component Value Date/Time   NA 143 08/20/2018 1636   K 4.1 08/20/2018 1636   CL 103 08/20/2018 1636   CO2 23 08/20/2018 1636   GLUCOSE 86 08/20/2018 1636   GLUCOSE 114 (H) 07/16/2018 1200   BUN 11 08/20/2018 1636   CREATININE 0.69 08/20/2018 1636   CREATININE 0.98 07/16/2018 1200   CALCIUM 9.2 08/20/2018 1636   PROT 6.5 08/20/2018 1636   ALBUMIN 4.0 08/20/2018 1636   AST 18 08/20/2018 1636   ALT 14 08/20/2018 1636   ALKPHOS 110 08/20/2018 1636   BILITOT 0.2 08/20/2018 1636   GFRNONAA 91 08/20/2018 1636   GFRNONAA 60 07/16/2018 1200   GFRAA 105 08/20/2018 1636   GFRAA 70 07/16/2018 1200   Lab Results  Component Value Date   CHOL 129 08/20/2018   HDL 45 08/20/2018   LDLCALC 37 08/20/2018   TRIG 233 (H) 08/20/2018   CHOLHDL 2.9 08/20/2018   Lab Results  Component Value Date   HGBA1C 5.6 08/20/2018   Lab Results  Component Value Date   [id number] 07/02/2017   Lab Results  Component Value Date   TSH 1.030 08/20/2018      ASSESSMENT AND PLAN 68 y.o. year old female  has a past medical history of Arthritis, Blood transfusion, Chronic diastolic CHF (congestive heart failure) ([place]), Fibromyalgia, Heart murmur, Hiatal hernia, OSA (obstructive sleep apnea), Peripheral autonomic neuropathy of unknown cause, and Urticaria. here with:  1.  Peripheral neuropathy  The patient will continue on  carbamazepine and gabapentin.  She expressed wanting to decrease her dose of gabapentin.  Advised that she can do a trial of taking gabapentin 2 tablets twice a day.  If her pain worsens she can return to taking 2 tablets in the morning and 3 tablets at bedtime.  I will refill lidocaine patches today.  If this is not approved through her insurance we could try a compounded cream to use on her legs.  She voiced understanding.  She will follow-up in 6 months or sooner if needed.  I spent 15 minutes with the patient. 50% of this time was spent discussing plan of care   Ward Givens, MSN, NP-C 09/02/2018, 1:20 PM [facility] [address], [place], [place] [address] [phone]

## 2018-09-02 NOTE — Patient Instructions (Addendum)
Your Plan:  Continue carbamazepine and Gabapentin  Lidocaine patches  If your symptoms worsen or you develop new symptoms please let us know.   Thank you for coming to see [place] at [facility]. I hope we have been able to provide you high quality care today.  You may receive a patient satisfaction survey over the next few weeks. We would appreciate your feedback and comments so that we may continue to improve ourselves and the health of our patients.

## 2018-09-02 NOTE — Progress Notes (Signed)
I have read the note, and I agree with the clinical assessment and plan.  Besnik Febus K Jonesha Tsuchiya   

## 2018-09-02 NOTE — Telephone Encounter (Signed)
PA for Lidocaine 5% patch PA completed and faxed to [facility], fax# [phone].  Dx: Diabetic peripheral neuropathy (E11.42)/fim

## 2018-09-03 NOTE — Telephone Encounter (Addendum)
Received fax from [facility], Lidocaine patch 5% approved through 09/02/19, 90 patches per 30 days. Approval letter and original fax request from [place] successfully faxed to [facility].

## 2018-09-11 ENCOUNTER — Other Ambulatory Visit: Payer: Self-pay | Admitting: Family Medicine

## 2018-09-11 DIAGNOSIS — G4733 Obstructive sleep apnea (adult) (pediatric): Secondary | ICD-10-CM | POA: Diagnosis not present

## 2018-09-11 DIAGNOSIS — R269 Unspecified abnormalities of gait and mobility: Secondary | ICD-10-CM | POA: Diagnosis not present

## 2018-09-18 ENCOUNTER — Ambulatory Visit: Payer: PPO | Admitting: Physician Assistant

## 2018-09-23 ENCOUNTER — Ambulatory Visit: Payer: PPO | Admitting: Family Medicine

## 2018-09-26 IMAGING — CT CT ANGIO CHEST
3 of 7 series · 17 of 46 positions shown · IV contrast (APPLIED)
Comparison: None.

Chest radiograph September 26, 2017 and CT chest March 29, 2009

CLINICAL DATA: Chest pain radiating to back, assess for thoracic
aortic dissection.

EXAM:
CT ANGIOGRAPHY CHEST WITH CONTRAST
TECHNIQUE: Multidetector CT imaging of the chest was performed using the
standard protocol during bolus administration of intravenous
contrast. Multiplanar CT image reconstructions and MIPs were
obtained to evaluate the vascular anatomy.
CONTRAST:  100mL SL08V0-DQC IOPAMIDOL (SL08V0-DQC) INJECTION 76%

[Series 6: arterial · axial · arterial · 0.72mm/px · z∈[+1296,+1476]mm · 11 of 108 slices shown]
[im 9/108  lung]
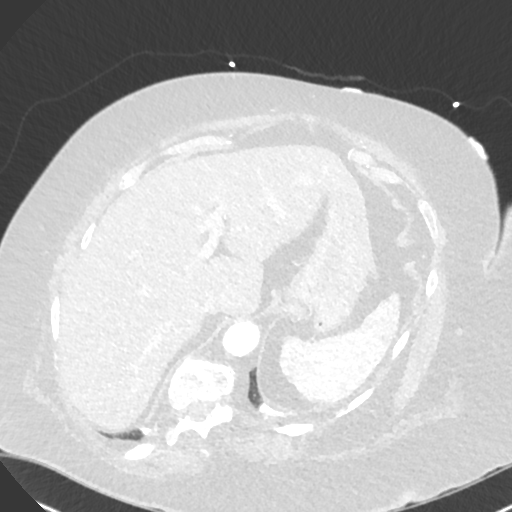
[im 18/108  soft-tissue]
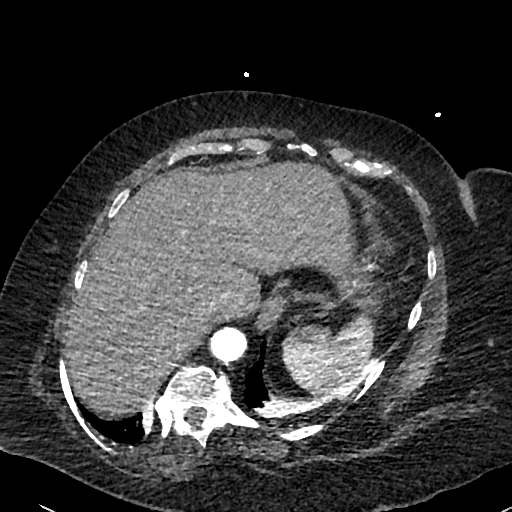
[im 27/108  lung]
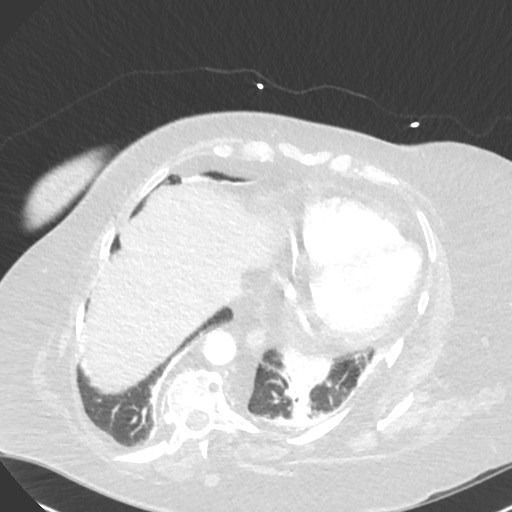
[im 36/108  soft-tissue]
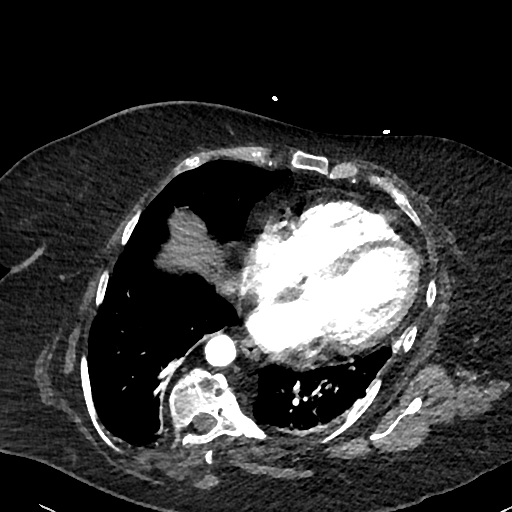
[im 45/108  lung]
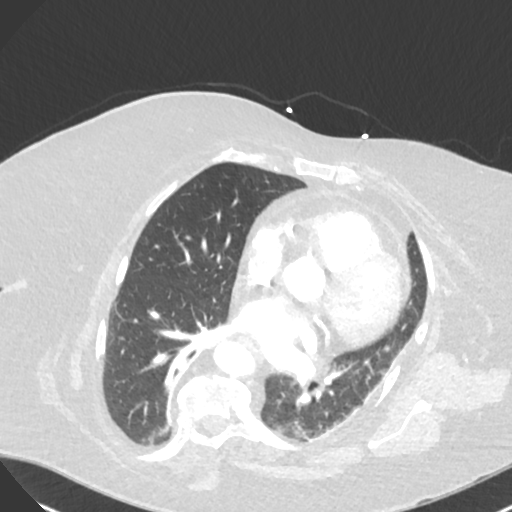
[im 54/108  soft-tissue]
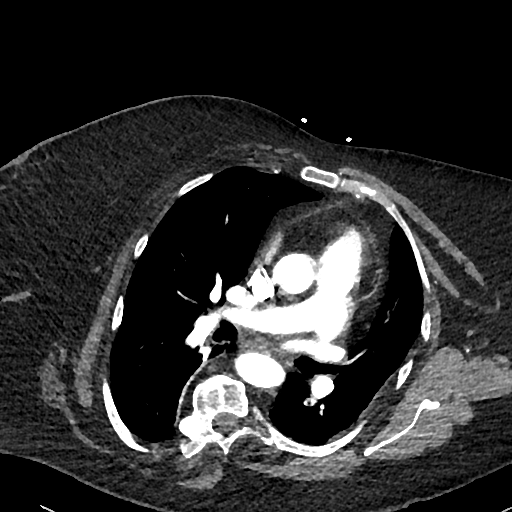
[im 63/108  lung]
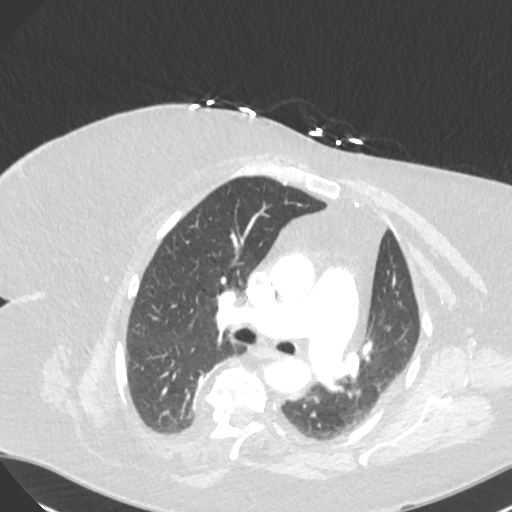
[im 72/108  soft-tissue]
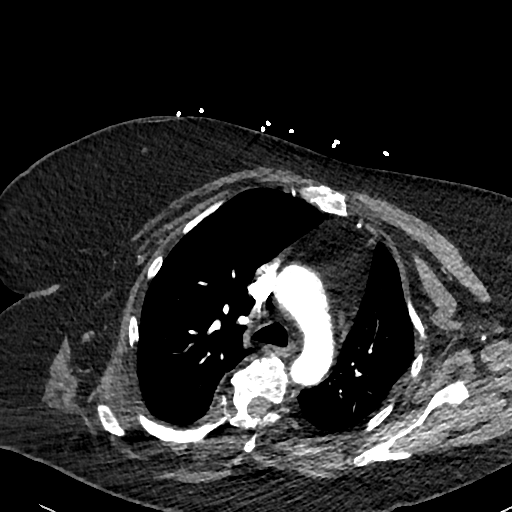
[im 81/108  lung]
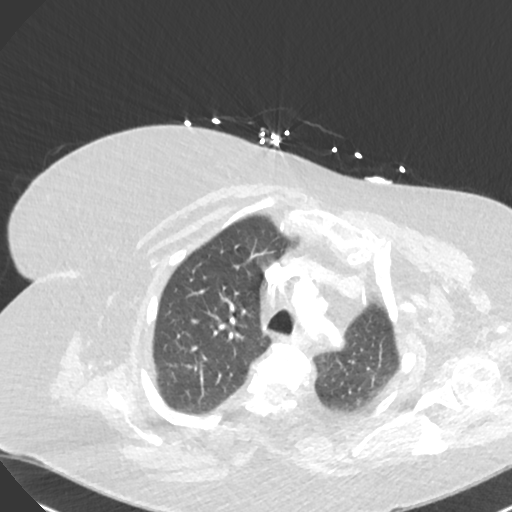
[im 90/108  soft-tissue]
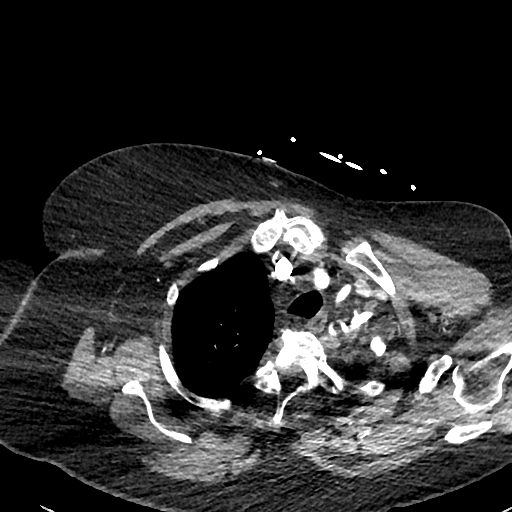
[im 99/108  lung]
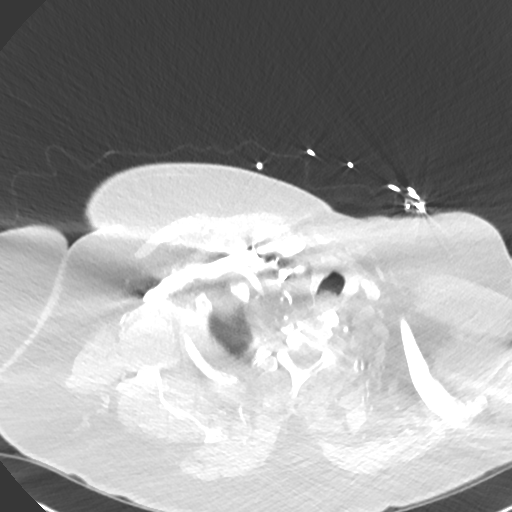

[Series 7: lung · axial · 0.72mm/px · z∈[+1296,+1350]mm · 3 of 108 slices shown]
[im 9/108  soft-tissue]
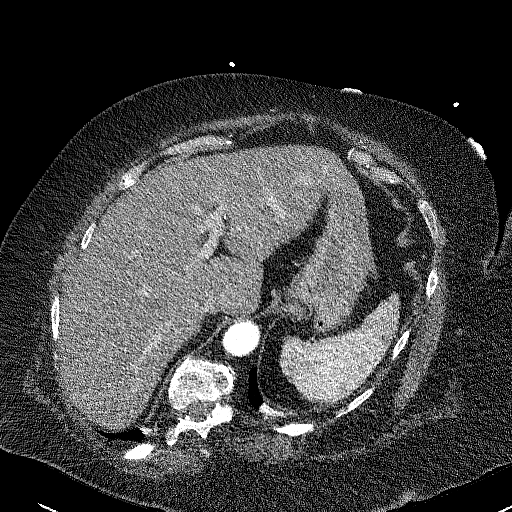
[im 27/108  soft-tissue]
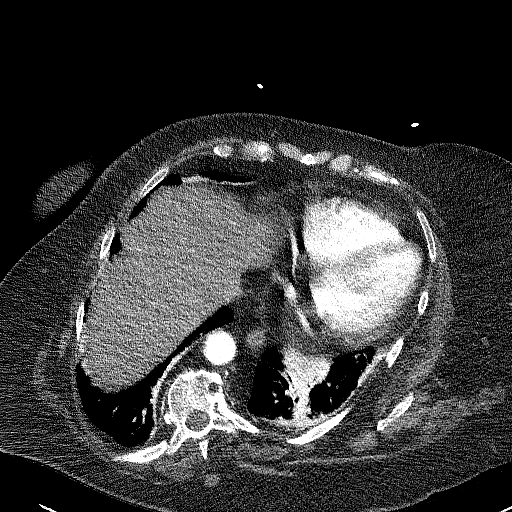
[im 36/108  soft-tissue]
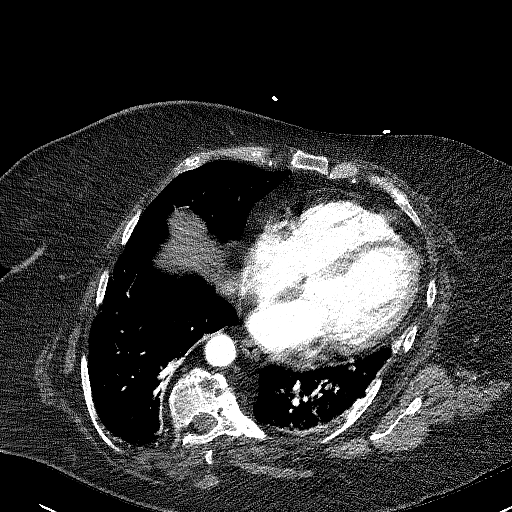

[Series 9: cor · coronal · 0.43mm/px · 3 of 154 slices shown]
[im 39/154  soft-tissue]
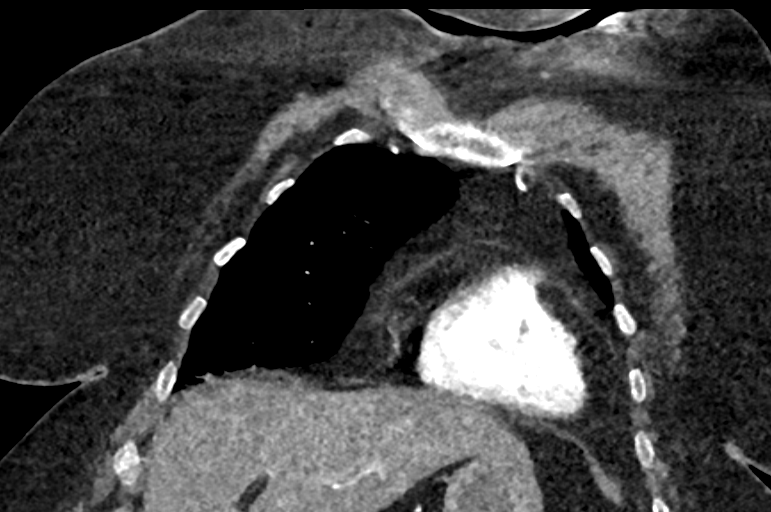
[im 77/154  soft-tissue]
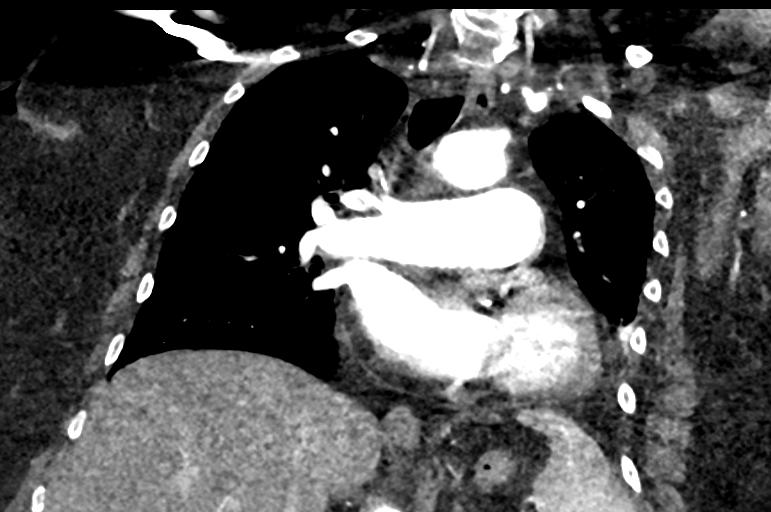
[im 115/154  soft-tissue]
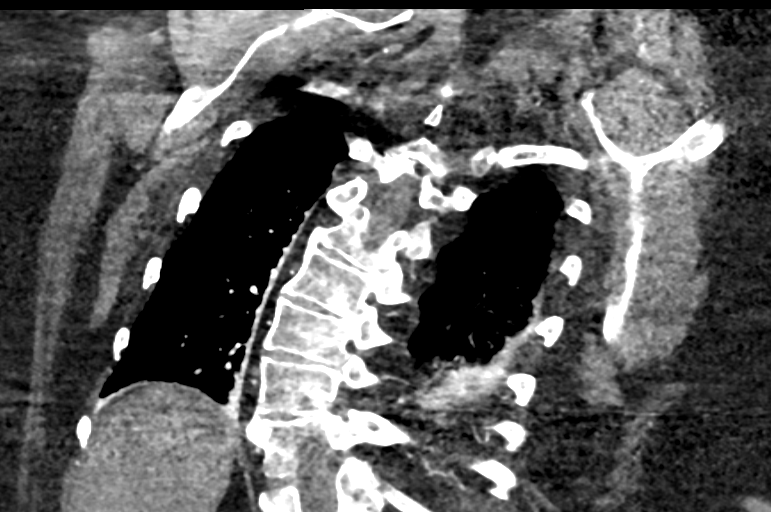

[17 of 46 positions shown; findings below may reference images not displayed]

FINDINGS: CARDIOVASCULAR: Thoracic aorta is normal course and caliber. No
intrinsic density on noncontrast CT. Homogeneous contrast
opacification of thoracic aorta without dissection, aneurysm,
luminal irregularity, periaortic fluid collections, or contrast
extravasation. Trace calcific atherosclerosis aortic arch. Heart
size is mildly enlarged. Mild coronary artery calcifications. No
pericardial effusion. Though not tailored for evaluation, no central
pulmonary embolus.

MEDIASTINUM/NODES: No mediastinal mass or lymphadenopathy by CT size
criteria. Prominent posterior mediastinal/prevertebral fat.

LUNGS/PLEURA: Tracheobronchial tree is patent, no pneumothorax. LEFT
lung base enhancing atelectasis. No pleural effusion or focal
consolidation.

UPPER ABDOMEN: Nonacute.

MUSCULOSKELETAL: Asymmetrically atrophic RIGHT chest wall
musculature versus patient positioning. Severe LEFT
acromioclavicular osteoarthrosis. Severe dextroscoliosis.

Review of the MIP images confirms the above findings.
IMPRESSION: 1. No acute aortic injury.
2. Mild cardiomegaly.
3. LEFT lung base atelectasis.

Aortic Atherosclerosis ([id number]).

## 2018-09-30 ENCOUNTER — Telehealth: Payer: Self-pay | Admitting: Rheumatology

## 2018-09-30 MED ORDER — HYDROCODONE-ACETAMINOPHEN 5-325 MG PO TABS: 1.0000 | ORAL_TABLET | Freq: Every evening | ORAL | 0 refills | Status: DC | PRN

## 2018-09-30 NOTE — Telephone Encounter (Signed)
Patient called requesting prescription refill of Hydrocodone to be sent to [facility].

## 2018-09-30 NOTE — Telephone Encounter (Signed)
Last Visit: 07/16/18 Next Visit: 12/17/18 UDS: 07/16/18 Narc Agreement: 07/16/18  Okay to refill Hydrocodone?

## 2018-10-15 ENCOUNTER — Encounter: Payer: Self-pay | Admitting: Physician Assistant

## 2018-10-15 ENCOUNTER — Ambulatory Visit: Payer: PPO | Admitting: Physician Assistant

## 2018-10-15 VITALS — BP 104/60 | HR 79 | Ht 62.0 in | Wt 227.2 lb

## 2018-10-15 DIAGNOSIS — I493 Ventricular premature depolarization: Secondary | ICD-10-CM | POA: Diagnosis not present

## 2018-10-15 DIAGNOSIS — G4733 Obstructive sleep apnea (adult) (pediatric): Secondary | ICD-10-CM

## 2018-10-15 DIAGNOSIS — I5032 Chronic diastolic (congestive) heart failure: Secondary | ICD-10-CM

## 2018-10-15 DIAGNOSIS — I10 Essential (primary) hypertension: Secondary | ICD-10-CM

## 2018-10-15 NOTE — Progress Notes (Signed)
Cardiology Office Note    Date:  10/15/2018   ID:  [name], DOB 1950/08/28, MRN [id number]  PCP:  Mellody Dance, DO  Cardiologist: Fransico Him, MD EPS: None Advanced heart failure Dr. Haroldine Laws Chief Complaint  Patient presents with  . 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  Patient was last seen by Dr. Haroldine Laws 09/2017 after emergency room visit for chest pain that was felt to be nonischemic.  Chest CT showed minimal calcium.  02/2017 normal LVEF, mild to moderate LVH with grade 1 DD.  Patient comes in for yearly f/u. Overall doing well. Swelling controlled, not taking any diuretics at this time.  Compliant with CPAP. Thinks she had the flu in December, and in January she thinks she had pleurisy. Didn't go to Dr. She is worried about coronavirus.  Her sons travels internationally all the time for work.    Past Medical History:  Diagnosis Date  . Arthritis    Rheumatoid arthritis,   . Blood transfusion    1981  . Chronic diastolic CHF (congestive heart failure) (HCC)    diastolic   . Fibromyalgia   . Heart murmur    as a child  . Hiatal hernia    sjorgens syndrome  . OSA (obstructive sleep apnea)   . Peripheral autonomic neuropathy of unknown cause   . Pneumonia 07/2018  . Urticaria     Past Surgical History:  Procedure Laterality Date  . ABDOMINAL HYSTERECTOMY     BTL, BSO  . ADENOIDECTOMY    . BREAST SURGERY     mass removal   . CHOLECYSTECTOMY    . DIAGNOSTIC LAPAROSCOPY     x3  . KNEE ARTHROSCOPY     x2  . MASS EXCISION Left 03/22/2017   Procedure: EXCISION OF LEFT BREAST MASS;  Surgeon: Donnie Mesa, MD;  Location: [facility];  Service: General;  Laterality: Left;  . patotid cystectomy    . TENDON REPAIR  1980   left ankle and tibia  . TONSILLECTOMY    . TOTAL KNEE ARTHROPLASTY  06/21/2011   Procedure: TOTAL KNEE  ARTHROPLASTY;  Surgeon: Gearlean Alf;  Location: [facility];  Service: Orthopedics;  Laterality: Right;  . TUBAL LIGATION      Current Medications: Current Meds  Medication Sig  . aspirin EC 81 MG tablet Take 81 mg by mouth at bedtime.  [name] atorvastatin (LIPITOR) 20 MG tablet TAKE 1 TABLET BY MOUTH DAILY.  . carbamazepine (TEGRETOL) 200 MG tablet Take 1 tablet (200 mg total) by mouth at bedtime.  . cycloSPORINE (RESTASIS) 0.05 % ophthalmic emulsion Place 2 drops into both eyes 2 (two) times daily.   . diphenoxylate-atropine (LOMOTIL) 2.5-0.025 MG per tablet Take 1 tablet by mouth 4 (four) times daily as needed for diarrhea or loose stools.  . fluticasone (FLONASE) 50 MCG/ACT nasal spray Use 1 spray per nostril 1-2 times daily as needed  . furosemide (LASIX) 20 MG tablet Take 20 mg by mouth as needed (60-80mg  as needed).  . gabapentin (NEURONTIN) 300 MG capsule TAKE 2 CAPSULES IN THE MORNING AND 3 CAPSULES AT BEDTIME  . HYDROcodone-acetaminophen (NORCO/VICODIN) 5-325 MG tablet Take 1 tablet by mouth at bedtime as needed.  . hydroxychloroquine (PLAQUENIL) 200 MG tablet TAKE 1 TABLET BY MOUTH TWICE A DAY MONDAY THROUGH FRIDAY.  . hyoscyamine (ANASPAZ) 0.125 MG TBDP  disintergrating tablet Place 0.25 mg under the tongue.  . leflunomide (ARAVA) 20 MG tablet TAKE 1 TABLET BY MOUTH DAILY.  [name] levocetirizine (XYZAL) 5 MG tablet Take 1 tablet (5 mg total) by mouth every evening.  . lidocaine (LIDODERM) 5 % Place 1 patch onto the skin daily as needed (for pain). Remove & Discard patch within 12 hours or as directed by MD  . metolazone (ZAROXOLYN) 2.5 MG tablet Take 2.5 mg by mouth daily as needed.  . metoprolol succinate (TOPROL-XL) 25 MG 24 hr tablet TAKE 1 TABLET BY MOUTH DAILY.  . Multiple Vitamin (MULTIVITAMIN WITH MINERALS) TABS tablet Take 1 tablet by mouth daily.  . NONFORMULARY OR COMPOUNDED ITEM Triamcinolone 0.1% & Silvadene cream 1:1- Apply as directed to affected areas  . promethazine  (PHENERGAN) 25 MG tablet TAKE 1 TABLET BY MOUTH EVERY 6 HOURS AS NEEDED.  [name] spironolactone (ALDACTONE) 25 MG tablet Take 1 tablet (25 mg total) by mouth 2 (two) times daily.  [name] tiZANidine (ZANAFLEX) 4 MG tablet TAKE 1 TABLET BY MOUTH DAILY AT BEDTIME  . torsemide (DEMADEX) 20 MG tablet Take 3 tablets (60 mg total) by mouth 2 (two) times daily.  [name] UNABLE TO FIND CPAP: AT bedtime; setting is "12"  . Vitamin D, Ergocalciferol, (DRISDOL) 1.25 MG (50000 UT) CAPS capsule TAKE 1 CAPSULE BY MOUTH EVERY 7 DAYS.     Allergies:   Sulfa antibiotics; Cymbalta [duloxetine hcl]; Demerol; Ivp dye [iodinated diagnostic agents]; Morphine and related; Sulfasalazine; and Adhesive [tape]   Social History   Socioeconomic History  . Marital status: Widowed    Spouse name: Not on file  . Number of children: 2  . Years of education: AS  . Highest education level: Not on file  Occupational History  . Occupation: disability  . Occupation: Therapist, sports  Social Needs  . Financial resource strain: Not on file  . Food insecurity:    Worry: Not on file    Inability: Not on file  . Transportation needs:    Medical: Not on file    Non-medical: Not on file  Tobacco Use  . Smoking status: Never Smoker  . Smokeless tobacco: Never Used  Substance and Sexual Activity  . Alcohol use: Yes    Comment: socially   . Drug use: No  . Sexual activity: Never  Lifestyle  . Physical activity:    Days per week: Not on file    Minutes per session: Not on file  . Stress: Not on file  Relationships  . Social connections:    Talks on phone: Not on file    Gets together: Not on file    Attends religious service: Not on file    Active member of club or organization: Not on file    Attends meetings of clubs or organizations: Not on file    Relationship status: Not on file  Other Topics Concern  . Not on file  Social History Narrative   Lives at home alone   Right-handed   Drinks 1 or less cups of coffee and 2 servings of either  tea or soda per day     Family History:  The patient's family history includes Alzheimer's disease in her mother; Asthma in her son; Cancer in her father; Glaucoma in her brother, brother, and mother; Heart attack in her brother and mother; Heart disease in her brother; Hyperlipidemia in her brother; Hypertension in her mother.   ROS:   Please see the history of present illness.  ROS All other systems reviewed and are negative.   PHYSICAL EXAM:   VS:  BP 104/60   Pulse 79   Ht 5\' 2"  (1.575 m)   Wt 227 lb 3.2 oz (103.1 kg)   SpO2 93%   BMI 41.56 kg/m   Physical Exam  OHY:WVPXT, in no acute distress  Neck: no JVD, carotid bruits, or masses Cardiac:RRR; no murmurs, rubs, or gallops  Respiratory:  Decrease breath sounds but clear GI: soft, nontender, nondistended, + BS Ext: without cyanosis, clubbing, or edema, Good distal pulses bilaterally Neuro:  Alert and Oriented x 3 Psych: euthymic mood, full affect  Wt Readings from Last 3 Encounters:  10/15/18 227 lb 3.2 oz (103.1 kg)  09/02/18 230 lb 12.8 oz (104.7 kg)  08/20/18 234 lb (106.1 kg)      Studies/Labs Reviewed:   EKG:  EKG is  ordered today.  The ekg ordered today demonstrates NSR with incomplete RBBB  Recent Labs: 08/20/2018: ALT 14; BUN 11; Creatinine, Ser 0.69; Hemoglobin 13.5; Magnesium 2.2; Platelets 266; Potassium 4.1; Sodium 143; TSH 1.030   Lipid Panel    Component Value Date/Time   CHOL 129 08/20/2018 1636   TRIG 233 (H) 08/20/2018 1636   HDL 45 08/20/2018 1636   CHOLHDL 2.9 08/20/2018 1636   CHOLHDL 4.7 08/11/2016 1017   VLDL 59 (H) 08/11/2016 1017   LDLCALC 37 08/20/2018 1636    Additional studies/ records that were reviewed today include:  CTA 2/2019CARDIOVASCULAR: Thoracic aorta is normal course and caliber. No intrinsic density on noncontrast CT. Homogeneous contrast opacification of thoracic aorta without dissection, aneurysm, luminal irregularity, periaortic fluid collections, or  contrast extravasation. Trace calcific atherosclerosis aortic arch. Heart size is mildly enlarged. Mild coronary artery calcifications. No pericardial effusion. Though not tailored for evaluation, no central pulmonary embolus.   IMPRESSION: 1. No acute aortic injury. 2. Mild cardiomegaly. 3. LEFT lung base atelectasis.   Aortic Atherosclerosis (ICD10-I70.0).     Electronically Signed   By: Elon Alas M.D.   On: 09/27/2017 00:22   2D echo 02/16/2017  Study Conclusions   - Left ventricle: The cavity size was normal. Wall thickness was   increased in a pattern of moderate LVH. Systolic function was   normal. The estimated ejection fraction was in the range of 60%   to 65%. Wall motion was normal; there were no regional wall   motion abnormalities. Doppler parameters are consistent with   abnormal left ventricular relaxation (grade 1 diastolic   dysfunction). - Mitral valve: Mildly calcified annulus.      ASSESSMENT:    1. Chronic diastolic CHF (congestive heart failure) ([facility])   2. Hypertension, unspecified type   3. PVC's (premature ventricular contractions)   4. OSA (obstructive sleep apnea)      PLAN:  In order of problems listed above:  Chronic diastolic CHF compensated on prn diuretics.  I told her not to take Zaroxolyn and Demadex and to only take Lasix as needed.  Compression stockings and low-sodium diet.  Blood pressure was quite low today so she is better off not using diuretics.  Continue spironolactone.  Essential hypertension blood pressure low would hold off on diuretics  PVCs trolled with metoprolol  OSA compliant with CPAP-Dr. Radford Pax manages.    Medication Adjustments/Labs and Tests Ordered: Current medicines are reviewed at length with the patient today.  Concerns regarding medicines are outlined above.  Medication changes, Labs and Tests ordered today are listed in the Patient Instructions below. Patient Instructions  Medication  Instructions:  Your physician recommends that you continue on your current medications as directed. Please refer to the Current Medication list given to you today.  If you need a refill on your cardiac medications before your next appointment, please call your pharmacy.   Lab work: None Ordered  If you have labs (blood work) drawn today and your tests are completely normal, you will receive your results only by: [name] MyChart Message (if you have MyChart) OR . A paper copy in the mail If you have any lab test that is abnormal or we need to change your treatment, we will call you to review the results.  Testing/Procedures: None ordered  Follow-Up: At [facility], you and your health needs are our priority.  As part of our continuing mission to provide you with exceptional heart care, we have created designated Provider Care Teams.  These Care Teams include your primary Cardiologist (physician) and Advanced Practice Providers (APPs -  Physician Assistants and Nurse Practitioners) who all work together to provide you with the care you need, when you need it. . You will need a follow up appointment in 1 year.  Please call our office 2 months in advance to schedule this appointment.  You may see Fransico Him, MD or one of the following Advanced Practice Providers on your designated Care Team:   . Lyda Jester, PA-C . Dayna Dunn, PA-C . Ermalinda Barrios, PA-C  Any Other Special Instructions Will Be Listed Below (If Applicable).       Signed, Ermalinda Barrios, PA-C  10/15/2018 1:47 PM    [facility] [address], [place], [place]  [address] Phone: [phone]; Fax: [phone]

## 2018-10-15 NOTE — Patient Instructions (Signed)
Medication Instructions:  Your physician recommends that you continue on your current medications as directed. Please refer to the Current Medication list given to you today.  If you need a refill on your cardiac medications before your next appointment, please call your pharmacy.   Lab work: None Ordered  If you have labs (blood work) drawn today and your tests are completely normal, you will receive your results only by: . MyChart Message (if you have MyChart) OR . A paper copy in the mail If you have any lab test that is abnormal or we need to change your treatment, we will call you to review the results.  Testing/Procedures: None ordered  Follow-Up: At [facility], you and your health needs are our priority.  As part of our continuing mission to provide you with exceptional heart care, we have created designated Provider Care Teams.  These Care Teams include your primary Cardiologist (physician) and Advanced Practice Providers (APPs -  Physician Assistants and Nurse Practitioners) who all work together to provide you with the care you need, when you need it. . You will need a follow up appointment in 1 year.  Please call our office 2 months in advance to schedule this appointment.  You may see Traci Turner, MD or one of the following Advanced Practice Providers on your designated Care Team:   . Brittainy Simmons, PA-C . Dayna Dunn, PA-C . Michele Lenze, PA-C  Any Other Special Instructions Will Be Listed Below (If Applicable).    

## 2018-10-17 ENCOUNTER — Ambulatory Visit: Payer: PPO | Admitting: Family Medicine

## 2018-10-24 ENCOUNTER — Other Ambulatory Visit: Payer: Self-pay | Admitting: Cardiology

## 2018-10-25 NOTE — Telephone Encounter (Signed)
Per recent office note with [name],   PLAN:  In order of problems listed above:  Chronic diastolic CHF compensated on prn diuretics.  I told her not to take Zaroxolyn and Demadex and to only take Lasix as needed.  Compression stockings and low-sodium diet.  Blood pressure was quite low today so she is better off not using diuretics.  Continue spironolactone.

## 2018-10-28 ENCOUNTER — Telehealth: Payer: Self-pay | Admitting: *Deleted

## 2018-10-28 NOTE — Telephone Encounter (Signed)
-----   Message from Sueanne Margarita, MD sent at 10/27/2018 11:44 PM EDT ----- Good AHI and compliance.  Continue current PAP settings.

## 2018-10-28 NOTE — Telephone Encounter (Signed)
Informed patient of sleep compliance results and patient understanding was verbalized. Patient is aware and agreeable to AHI being within range at 2.7. Patient is aware and agreeable to being in compliance with machine usage. Patient is aware and agreeable to no change in current pressures.

## 2018-10-31 ENCOUNTER — Other Ambulatory Visit: Payer: Self-pay | Admitting: Rheumatology

## 2018-11-01 NOTE — Telephone Encounter (Signed)
Last Visit: 07/16/18 Next Visit: 12/17/18 Labs: 08/20/18 WNL PLQ Eye Exam:  10/16/17 WNL  Okay to refill per Dr. Estanislado Pandy

## 2018-11-05 ENCOUNTER — Other Ambulatory Visit: Payer: Self-pay | Admitting: Rheumatology

## 2018-11-05 ENCOUNTER — Other Ambulatory Visit: Payer: Self-pay | Admitting: Cardiology

## 2018-11-05 ENCOUNTER — Other Ambulatory Visit: Payer: Self-pay | Admitting: Physician Assistant

## 2018-11-05 NOTE — Telephone Encounter (Signed)
Last Visit: 07/16/18 Next Visit: 12/17/18 Labs: 08/20/18 WNL  Okay to refill per Dr. Estanislado Pandy

## 2018-11-05 NOTE — Telephone Encounter (Signed)
Last Visit: 07/16/18 Next Visit: 12/17/18 UDS: 07/16/18 Narc Agreement: 07/16/18  Okay to refill Hydrocodone?

## 2018-11-11 ENCOUNTER — Ambulatory Visit: Payer: PPO | Admitting: Family Medicine

## 2018-11-12 ENCOUNTER — Other Ambulatory Visit: Payer: Self-pay | Admitting: Rheumatology

## 2018-11-12 ENCOUNTER — Telehealth: Payer: Self-pay | Admitting: Rheumatology

## 2018-11-12 MED ORDER — TIZANIDINE HCL 4 MG PO TABS: 4.0000 mg | ORAL_TABLET | Freq: Every day | ORAL | 0 refills | Status: DC

## 2018-11-12 NOTE — Telephone Encounter (Signed)
Patient called stating she is in [place] staying with relatives and is requesting a prescription refill of Tizanidine.  Patient states [facility] is unable to transfer the medication and requested that she call Dr. Estanislado Pandy to have prescription sent.  Patient states the pharmacy in [place] is [facility].

## 2018-11-12 NOTE — Telephone Encounter (Signed)
Last Visit: 07/16/18 Next Visit: 12/17/18  Okay to refill per Dr. Estanislado Pandy

## 2018-11-21 ENCOUNTER — Ambulatory Visit: Payer: PPO | Admitting: Family Medicine

## 2018-12-11 ENCOUNTER — Other Ambulatory Visit: Payer: Self-pay | Admitting: Family Medicine

## 2018-12-11 ENCOUNTER — Other Ambulatory Visit: Payer: Self-pay | Admitting: Rheumatology

## 2018-12-11 ENCOUNTER — Telehealth: Payer: Self-pay | Admitting: Rheumatology

## 2018-12-11 ENCOUNTER — Other Ambulatory Visit: Payer: Self-pay | Admitting: Allergy and Immunology

## 2018-12-11 DIAGNOSIS — I5032 Chronic diastolic (congestive) heart failure: Secondary | ICD-10-CM

## 2018-12-11 DIAGNOSIS — I10 Essential (primary) hypertension: Secondary | ICD-10-CM

## 2018-12-11 NOTE — Telephone Encounter (Signed)
Patient requested prescription refill of Hydrocodone to be sent to [facility] [place].  Patient rescheduled her appointment with Dr. Estanislado Pandy for 5/29 and plans to have labwork at that appointment.

## 2018-12-11 NOTE — Telephone Encounter (Signed)
Last Visit: 07/16/18 Next Visit: 12/17/18 UDS: 07/16/18 Narc Agreement: 07/16/18  Okay to refill Hydrocodone?

## 2018-12-12 MED ORDER — HYDROCODONE-ACETAMINOPHEN 5-325 MG PO TABS: 1.0000 | ORAL_TABLET | Freq: Every evening | ORAL | 0 refills | Status: DC | PRN

## 2018-12-12 NOTE — Telephone Encounter (Signed)
Last Visit: 07/16/18 Next Visit: 12/17/18  Okay to refill per Dr. Estanislado Pandy

## 2018-12-12 NOTE — Telephone Encounter (Signed)
Ok to refill 

## 2018-12-17 ENCOUNTER — Ambulatory Visit: Payer: PPO | Admitting: Rheumatology

## 2018-12-23 ENCOUNTER — Ambulatory Visit: Payer: Self-pay | Admitting: Rheumatology

## 2018-12-24 ENCOUNTER — Telehealth: Payer: Self-pay

## 2018-12-24 NOTE — Telephone Encounter (Signed)
I have not seen her for a year.  She will need a BMET first

## 2018-12-25 NOTE — Telephone Encounter (Signed)
After reviewing her meds she shouldn't need potassium. She is on spironolactone which helps retain potassium and only taking diuretics as needed. Potassium was normal in Jan.

## 2018-12-25 NOTE — Telephone Encounter (Signed)
Pt advised recommendations to hold her K supplements.. Pt agreed and having repeat labs 01/11/19 with her Rheumatologist.. Dr. Jackquline Berlin on 01/10/19 will forward to [place] for review.

## 2018-12-27 NOTE — Progress Notes (Deleted)
Office Visit Note  Patient: [name]             Date of Birth: 02-03-1951           MRN: [id number]             PCP: Mellody Dance, DO Referring: Mellody Dance, DO Visit Date: 01/10/2019 Occupation: @GUAROCC @  Subjective:  No chief complaint on file.  Arava 20 mg daily and Plaquenil 200 mg twice daily Monday through Friday only.  Last Plaquenil eye exam normal on 10/16/2017.  Most recent CBC/CMP within normal limits on 08/20/2018.  She is overdue for labs.  CBC/CMP ordered for today and will monitor every 3 months.  Standing orders placed.  She received her flu vaccine in October and previously Pneumovax 23 and Prevnar 13.  Recommend Shingrix as indicated.  History of Present Illness: [name] is a 68 y.o. female ***   Activities of Daily Living:  Patient reports morning stiffness for *** {minute/hour:19697}.   Patient {ACTIONS;DENIES/REPORTS:21021675::"Denies"} nocturnal pain.  Difficulty dressing/grooming: {ACTIONS;DENIES/REPORTS:21021675::"Denies"} Difficulty climbing stairs: {ACTIONS;DENIES/REPORTS:21021675::"Denies"} Difficulty getting out of chair: {ACTIONS;DENIES/REPORTS:21021675::"Denies"} Difficulty using hands for taps, buttons, cutlery, and/or writing: {ACTIONS;DENIES/REPORTS:21021675::"Denies"}  No Rheumatology ROS completed.   PMFS History:  Patient Active Problem List   Diagnosis Date Noted  . Hypertension 08/20/2018  . Lumbar pain 07/04/2018  . Recurrent urticaria 05/20/2018  . Rhinitis 05/20/2018  . Vitamin D deficiency 11/20/2017  . Mixed hyperlipidemia 11/20/2017  . Muscle cramps 11/20/2017  . Family history of coronary arteriosclerosis- strong fam h/o CAD and early CAD.  07/17/2017  . Raynaud's disease without gangrene 03/12/2017  . Abnormal weight gain 02/06/2017  . Shingles outbreak 02/06/2017  . Cyst (solitary) of breast, left 01/31/2017  . Eosinophilic esophagitis 52/77/8242  . History of hyperlipidemia 10/04/2016  . Osteoarthritis of  lumbar spine 09/09/2016  . History of diabetes mellitus 09/09/2016  . History of diverticulitis 09/03/2016  . Osteoporosis 09/03/2016  . Autoimmune disease ([place]) 09/02/2016  . High risk medication use 09/02/2016  . History of esophagitis 09/02/2016  . Elevated triglycerides with high cholesterol 08/23/2016  . Low serum HDL 08/23/2016  . Breast cyst, left 06/14/2016  . Encounter for wellness examination 05/23/2016  . Abnormality of gait 05/09/2016  . Hypokalemia 03/22/2016  . Sjoegren syndrome 02/29/2016  . On potassium wasting diuretic therapy 02/29/2016  . GERD (gastroesophageal reflux disease) 01/19/2016  . Glucose intolerance (impaired glucose tolerance) 01/19/2016  . Chronic diastolic CHF (congestive heart failure) ([place]) 04/20/2015  . Peripheral polyneuropathy 02/02/2014  . Morbid obesity ([place]) 11/17/2013  . Heart murmur   . OSA (obstructive sleep apnea)   . Rheumatoid arthritis ([place])   . Hiatal hernia   . Fibromyalgia   . PVC's (premature ventricular contractions)   . 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 Unilateral primary osteoarthritis, left knee 06/22/2011    Past Medical History:  Diagnosis Date  . Arthritis    Rheumatoid arthritis,   . Blood transfusion    1981  . Chronic diastolic CHF (congestive heart failure) (HCC)    diastolic   . Fibromyalgia   . Heart murmur    as a child  . Hiatal hernia    sjorgens syndrome  . OSA (obstructive sleep apnea)   . Peripheral autonomic neuropathy of unknown cause   . Pneumonia 07/2018  . Urticaria     Family History  Problem Relation Age of Onset  . Alzheimer's disease Mother   . Heart attack Mother   .  Hypertension Mother   . Glaucoma Mother   . Cancer Father   . Heart attack Brother   . Heart disease Brother   . Glaucoma Brother   . Hyperlipidemia Brother   . Glaucoma Brother   . Asthma Son   . Allergic rhinitis Neg Hx   . Angioedema Neg Hx   . Eczema Neg Hx   . Urticaria Neg Hx    Past  Surgical History:  Procedure Laterality Date  . ABDOMINAL HYSTERECTOMY     BTL, BSO  . ADENOIDECTOMY    . BREAST SURGERY     mass removal   . CHOLECYSTECTOMY    . DIAGNOSTIC LAPAROSCOPY     x3  . KNEE ARTHROSCOPY     x2  . MASS EXCISION Left 03/22/2017   Procedure: EXCISION OF LEFT BREAST MASS;  Surgeon: Donnie Mesa, MD;  Location: [facility];  Service: General;  Laterality: Left;  . patotid cystectomy    . TENDON REPAIR  1980   left ankle and tibia  . TONSILLECTOMY    . TOTAL KNEE ARTHROPLASTY  06/21/2011   Procedure: TOTAL KNEE ARTHROPLASTY;  Surgeon: Gearlean Alf;  Location: [facility];  Service: Orthopedics;  Laterality: Right;  . TUBAL LIGATION     Social History   Social History Narrative   Lives at home alone   Right-handed   Drinks 1 or less cups of coffee and 2 servings of either tea or soda per day   Immunization History  Administered Date(s) Administered  . Influenza, High Dose Seasonal PF 06/12/2018  . Influenza,inj,Quad PF,6+ Mos 05/23/2016, 06/01/2017  . Influenza-Unspecified 06/05/2018  . Pneumococcal Conjugate-13 07/02/2017  . Pneumococcal Polysaccharide-23 05/17/2005  . Td 10/16/2007  . Tdap 03/21/2018     Objective: Vital Signs: There were no vitals taken for this visit.   Physical Exam   Musculoskeletal Exam: ***  CDAI Exam: CDAI Score: Not documented Patient Global Assessment: Not documented; Provider Global Assessment: Not documented Swollen: Not documented; Tender: Not documented Joint Exam   Not documented   There is currently no information documented on the homunculus. Go to the Rheumatology activity and complete the homunculus joint exam.  Investigation: No additional findings.  Imaging: No results found.  Recent Labs: Lab Results  Component Value Date   WBC 6.1 08/20/2018   HGB 13.5 08/20/2018   PLT 266 08/20/2018   NA 143 08/20/2018   K 4.1 08/20/2018   CL 103 08/20/2018   CO2 23 08/20/2018   GLUCOSE 86 08/20/2018   BUN  11 08/20/2018   CREATININE 0.69 08/20/2018   BILITOT 0.2 08/20/2018   ALKPHOS 110 08/20/2018   AST 18 08/20/2018   ALT 14 08/20/2018   PROT 6.5 08/20/2018   ALBUMIN 4.0 08/20/2018   CALCIUM 9.2 08/20/2018   GFRAA 105 08/20/2018    Speciality Comments: PLQ Eye Exam: 10/16/17 WNL @ [facility] follow up in 1 year  Procedures:  No procedures performed Allergies: Sulfa antibiotics; Cymbalta [duloxetine hcl]; Demerol; Ivp dye [iodinated diagnostic agents]; Morphine and related; Sulfasalazine; and Adhesive [tape]   Assessment / Plan:     Visit Diagnoses: No diagnosis found.   Orders: No orders of the defined types were placed in this encounter.  No orders of the defined types were placed in this encounter.   Face-to-face time spent with patient was *** minutes. Greater than 50% of time was spent in counseling and coordination of care.  Follow-Up Instructions: No follow-ups on file.   Ofilia Neas,  PA-C  Note - This record has been created using [employer].  Chart creation errors have been sought, but may not always  have been located. Such creation errors do not reflect on  the standard of medical care.

## 2019-01-09 ENCOUNTER — Telehealth: Payer: Self-pay | Admitting: Family Medicine

## 2019-01-09 NOTE — Telephone Encounter (Signed)
Patient called states she wonders if she had Coronavirus earlier this year when she was sick & tested (neg ) for Flu with [place]--- Wonders if she can be tested .  --Forwarding message to medical assistant to contact pt with advice. --glh

## 2019-01-09 NOTE — Telephone Encounter (Signed)
Called the patient and unable to reach her no voicemail set up. MPulliam, CMA/RT(R)

## 2019-01-10 ENCOUNTER — Ambulatory Visit: Payer: PPO | Admitting: Physician Assistant

## 2019-01-14 ENCOUNTER — Telehealth: Payer: Self-pay | Admitting: Family Medicine

## 2019-01-14 NOTE — Progress Notes (Deleted)
Office Visit Note  Patient: [name]             Date of Birth: Aug 21, 1950           MRN: [id number]             PCP: Mellody Dance, DO Referring: Mellody Dance, DO Visit Date: 01/21/2019 Occupation: @GUAROCC @  Subjective:  No chief complaint on file.  She is on Plaquenil 200 mg twice daily Monday through Friday only and Arava 20 mg daily.  Last Plaquenil eye exam normal on 10/16/2017.  Most recent CBC/CMP within normal limits on 08/20/2018.  Due for CBC/CMP today and will monitor every 3 months.  Standing orders placed.  She received her flu vaccine in October and previously Prevnar 13 and Pneumovax 23.  Recommend Shingrix vaccine as indicated.  History of Present Illness: [name] is a 68 y.o. female ***   Activities of Daily Living:  Patient reports morning stiffness for *** {minute/hour:19697}.   Patient {ACTIONS;DENIES/REPORTS:21021675::"Denies"} nocturnal pain.  Difficulty dressing/grooming: {ACTIONS;DENIES/REPORTS:21021675::"Denies"} Difficulty climbing stairs: {ACTIONS;DENIES/REPORTS:21021675::"Denies"} Difficulty getting out of chair: {ACTIONS;DENIES/REPORTS:21021675::"Denies"} Difficulty using hands for taps, buttons, cutlery, and/or writing: {ACTIONS;DENIES/REPORTS:21021675::"Denies"}  No Rheumatology ROS completed.   PMFS History:  Patient Active Problem List   Diagnosis Date Noted  . Hypertension 08/20/2018  . Lumbar pain 07/04/2018  . Recurrent urticaria 05/20/2018  . Rhinitis 05/20/2018  . Vitamin D deficiency 11/20/2017  . Mixed hyperlipidemia 11/20/2017  . Muscle cramps 11/20/2017  . Family history of coronary arteriosclerosis- strong fam h/o CAD and early CAD.  07/17/2017  . Raynaud's disease without gangrene 03/12/2017  . Abnormal weight gain 02/06/2017  . Shingles outbreak 02/06/2017  . Cyst (solitary) of breast, left 01/31/2017  . Eosinophilic esophagitis 46/56/8127  . History of hyperlipidemia 10/04/2016  . Osteoarthritis of lumbar  spine 09/09/2016  . History of diabetes mellitus 09/09/2016  . History of diverticulitis 09/03/2016  . Osteoporosis 09/03/2016  . Autoimmune disease ([place]) 09/02/2016  . High risk medication use 09/02/2016  . History of esophagitis 09/02/2016  . Elevated triglycerides with high cholesterol 08/23/2016  . Low serum HDL 08/23/2016  . Breast cyst, left 06/14/2016  . Encounter for wellness examination 05/23/2016  . Abnormality of gait 05/09/2016  . Hypokalemia 03/22/2016  . Sjoegren syndrome 02/29/2016  . On potassium wasting diuretic therapy 02/29/2016  . GERD (gastroesophageal reflux disease) 01/19/2016  . Glucose intolerance (impaired glucose tolerance) 01/19/2016  . Chronic diastolic CHF (congestive heart failure) ([place]) 04/20/2015  . Peripheral polyneuropathy 02/02/2014  . Morbid obesity ([place]) 11/17/2013  . Heart murmur   . OSA (obstructive sleep apnea)   . Rheumatoid arthritis ([place])   . Hiatal hernia   . Fibromyalgia   . PVC's (premature ventricular contractions)   . 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 Unilateral primary osteoarthritis, left knee 06/22/2011    Past Medical History:  Diagnosis Date  . Arthritis    Rheumatoid arthritis,   . Blood transfusion    1981  . Chronic diastolic CHF (congestive heart failure) ([facility])    diastolic   . Fibromyalgia   . Heart murmur    as a child  . Hiatal hernia    sjorgens syndrome  . OSA (obstructive sleep apnea)   . Peripheral autonomic neuropathy of unknown cause   . Pneumonia 07/2018  . Urticaria     Family History  Problem Relation Age of Onset  . Alzheimer's disease Mother   . Heart attack Mother   . Hypertension Mother   .  Glaucoma Mother   . Cancer Father   . Heart attack Brother   . Heart disease Brother   . Glaucoma Brother   . Hyperlipidemia Brother   . Glaucoma Brother   . Asthma Son   . Allergic rhinitis Neg Hx   . Angioedema Neg Hx   . Eczema Neg Hx   . Urticaria Neg Hx    Past  Surgical History:  Procedure Laterality Date  . ABDOMINAL HYSTERECTOMY     BTL, BSO  . ADENOIDECTOMY    . BREAST SURGERY     mass removal   . CHOLECYSTECTOMY    . DIAGNOSTIC LAPAROSCOPY     x3  . KNEE ARTHROSCOPY     x2  . MASS EXCISION Left 03/22/2017   Procedure: EXCISION OF LEFT BREAST MASS;  Surgeon: Donnie Mesa, MD;  Location: [facility];  Service: General;  Laterality: Left;  . patotid cystectomy    . TENDON REPAIR  1980   left ankle and tibia  . TONSILLECTOMY    . TOTAL KNEE ARTHROPLASTY  06/21/2011   Procedure: TOTAL KNEE ARTHROPLASTY;  Surgeon: Gearlean Alf;  Location: [facility];  Service: Orthopedics;  Laterality: Right;  . TUBAL LIGATION     Social History   Social History Narrative   Lives at home alone   Right-handed   Drinks 1 or less cups of coffee and 2 servings of either tea or soda per day   Immunization History  Administered Date(s) Administered  . Influenza, High Dose Seasonal PF 06/12/2018  . Influenza,inj,Quad PF,6+ Mos 05/23/2016, 06/01/2017  . Influenza-Unspecified 06/05/2018  . Pneumococcal Conjugate-13 07/02/2017  . Pneumococcal Polysaccharide-23 05/17/2005  . Td 10/16/2007  . Tdap 03/21/2018     Objective: Vital Signs: There were no vitals taken for this visit.   Physical Exam   Musculoskeletal Exam: ***  CDAI Exam: CDAI Score: Not documented Patient Global Assessment: Not documented; Provider Global Assessment: Not documented Swollen: Not documented; Tender: Not documented Joint Exam   Not documented   There is currently no information documented on the homunculus. Go to the Rheumatology activity and complete the homunculus joint exam.  Investigation: No additional findings.  Imaging: No results found.  Recent Labs: Lab Results  Component Value Date   WBC 6.1 08/20/2018   HGB 13.5 08/20/2018   PLT 266 08/20/2018   NA 143 08/20/2018   K 4.1 08/20/2018   CL 103 08/20/2018   CO2 23 08/20/2018   GLUCOSE 86 08/20/2018   BUN  11 08/20/2018   CREATININE 0.69 08/20/2018   BILITOT 0.2 08/20/2018   ALKPHOS 110 08/20/2018   AST 18 08/20/2018   ALT 14 08/20/2018   PROT 6.5 08/20/2018   ALBUMIN 4.0 08/20/2018   CALCIUM 9.2 08/20/2018   GFRAA 105 08/20/2018    Speciality Comments: PLQ Eye Exam: 10/16/17 WNL @ [facility] follow up in 1 year  Procedures:  No procedures performed Allergies: Sulfa antibiotics; Cymbalta [duloxetine hcl]; Demerol; Ivp dye [iodinated diagnostic agents]; Morphine and related; Sulfasalazine; and Adhesive [tape]   Assessment / Plan:     Visit Diagnoses: No diagnosis found.   Orders: No orders of the defined types were placed in this encounter.  No orders of the defined types were placed in this encounter.   Face-to-face time spent with patient was *** minutes. Greater than 50% of time was spent in counseling and coordination of care.  Follow-Up Instructions: No follow-ups on file.   Ofilia Neas, PA-C  Note - This  record has been created using [employer].  Chart creation errors have been sought, but may not always  have been located. Such creation errors do not reflect on  the standard of medical care.

## 2019-01-14 NOTE — Telephone Encounter (Signed)
Called and notified patient that we are not testing unless patient has active symptoms.  Patient would like the antibody test advised patient that I will have to find out about how and if we are doing that testing at this time. MPulliam, CMA/RT(R)

## 2019-01-21 ENCOUNTER — Ambulatory Visit: Payer: PPO | Admitting: Physician Assistant

## 2019-01-27 NOTE — Progress Notes (Signed)
Office Visit Note  Patient: [name]             Date of Birth: [birth year]           MRN: [id number]             PCP: Mellody Dance, DO Referring: Mellody Dance, DO Visit Date: 02/10/2019 Occupation: @GUAROCC @  Subjective:  Pain in both hands    History of Present Illness: [name] is a 68 y.o. female with history of seropositive rheumatoid arthritis, osteoarthritis, Sjogren's, and fibromyalgia. She is taking plaquenil 200 mg 1 tablet by mouth twice daily M-F and Arava 20 mg po daily.  She has not missed any doses recently.  She has not had any recent infections.  She continues to have intermittent pain in bilateral hands and bilateral wrist joints.  She has occasional knee joint pain which is activity dependent.  She continues to have symptoms of neuropathy in both feet which cause some discomfort at night.  She has generalized muscle aches and muscle tenderness due to fibromyalgia.  She continues to have some tender points.  She reports her level of fatigue has been chronic and stable.  She has some difficulty falling asleep at night but is able to sleep through the night usually.  She continues her chronic sicca symptoms and intermittent symptoms of Raynaud's     Activities of Daily Living:  Patient reports morning stiffness for 30 minutes.   Patient Reports nocturnal pain.  Difficulty dressing/grooming: Denies Difficulty climbing stairs: Reports  Difficulty getting out of chair: Reports Difficulty using hands for taps, buttons, cutlery, and/or writing: Denies  Review of Systems  Constitutional: Positive for fatigue.  HENT: Positive for mouth dryness. Negative for mouth sores and nose dryness.   Eyes: Positive for dryness. Negative for pain and visual disturbance.  Respiratory: Negative for cough, hemoptysis, shortness of breath and difficulty breathing.   Cardiovascular: Positive for swelling in legs/feet. Negative for chest pain, palpitations and hypertension.   Gastrointestinal: Negative for blood in stool, constipation and diarrhea.  Endocrine: Negative for increased urination.  Genitourinary: Negative for painful urination.  Musculoskeletal: Positive for arthralgias, joint pain, joint swelling, myalgias, morning stiffness, muscle tenderness and myalgias. Negative for muscle weakness.  Skin: Negative for color change, pallor, rash, hair loss, nodules/bumps, skin tightness, ulcers and sensitivity to sunlight.  Allergic/Immunologic: Negative for susceptible to infections.  Neurological: Negative for dizziness, numbness, headaches and weakness.  Hematological: Negative for swollen glands.  Psychiatric/Behavioral: Negative for depressed mood and sleep disturbance. The patient is not nervous/anxious.     PMFS History:  Patient Active Problem List   Diagnosis Date Noted  . Hypertension 08/20/2018  . Lumbar pain 07/04/2018  . Recurrent urticaria 05/20/2018  . Rhinitis 05/20/2018  . Vitamin D deficiency 11/20/2017  . Mixed hyperlipidemia 11/20/2017  . Muscle cramps 11/20/2017  . Family history of coronary arteriosclerosis- strong fam h/o CAD and early CAD.  07/17/2017  . Raynaud's disease without gangrene 03/12/2017  . Abnormal weight gain 02/06/2017  . Shingles outbreak 02/06/2017  . Cyst (solitary) of breast, left 01/31/2017  . Eosinophilic esophagitis 24/23/5361  . History of hyperlipidemia 10/04/2016  . 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 History of diabetes mellitus 09/09/2016  . History of diverticulitis 09/03/2016  . Osteoporosis 09/03/2016  . Autoimmune disease ([place]) 09/02/2016  . High risk medication use 09/02/2016  . History of esophagitis 09/02/2016  . Elevated triglycerides with high cholesterol 08/23/2016  . Low serum HDL 08/23/2016  .  Breast cyst, left 06/14/2016  . Encounter for wellness examination 05/23/2016  . Abnormality of gait 05/09/2016  . Hypokalemia 03/22/2016  . Sjoegren syndrome 02/29/2016  . On  potassium wasting diuretic therapy 02/29/2016  . GERD (gastroesophageal reflux disease) 01/19/2016  . Glucose intolerance (impaired glucose tolerance) 01/19/2016  . Chronic diastolic CHF (congestive heart failure) ([place]) 04/20/2015  . Peripheral polyneuropathy 02/02/2014  . Morbid obesity ([place]) 11/17/2013  . Heart murmur   . OSA (obstructive sleep apnea)   . Rheumatoid arthritis ([place])   . Hiatal hernia   . Fibromyalgia   . PVC's (premature ventricular contractions)   . 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 Unilateral primary osteoarthritis, left knee 06/22/2011    Past Medical History:  Diagnosis Date  . Arthritis    Rheumatoid arthritis,   . Blood transfusion    1981  . Chronic diastolic CHF (congestive heart failure) (H[facility])    diastolic   . Fibromyalgia   . Heart murmur    as a child  . Hiatal hernia    sjorgens syndrome  . OSA (obstructive sleep apnea)   . Peripheral autonomic neuropathy of unknown cause   . Pneumonia 07/2018  . Urticaria     Family History  Problem Relation Age of Onset  . Alzheimer's disease Mother   . Heart attack Mother   . Hypertension Mother   . Glaucoma Mother   . Cancer Father   . Heart attack Brother   . Heart disease Brother   . Glaucoma Brother   . Hyperlipidemia Brother   . Glaucoma Brother   . Asthma Son   . Allergic rhinitis Neg Hx   . Angioedema Neg Hx   . Eczema Neg Hx   . Urticaria Neg Hx    Past Surgical History:  Procedure Laterality Date  . ABDOMINAL HYSTERECTOMY     BTL, BSO  . ADENOIDECTOMY    . BREAST SURGERY     mass removal   . CHOLECYSTECTOMY    . dental implants    . DIAGNOSTIC LAPAROSCOPY     x3  . KNEE ARTHROSCOPY     x2  . MASS EXCISION Left 03/22/2017   Procedure: EXCISION OF LEFT BREAST MASS;  Surgeon: Donnie Mesa, MD;  Location: [facility];  Service: General;  Laterality: Left;  . patotid cystectomy    . TENDON REPAIR  1980   left ankle and tibia  . TONSILLECTOMY    . TOTAL KNEE  ARTHROPLASTY  06/21/2011   Procedure: TOTAL KNEE ARTHROPLASTY;  Surgeon: Gearlean Alf;  Location: [facility];  Service: Orthopedics;  Laterality: Right;  . TUBAL LIGATION     Social History   Social History Narrative   Lives at home alone   Right-handed   Drinks 1 or less cups of coffee and 2 servings of either tea or soda per day   Immunization History  Administered Date(s) Administered  . Influenza, High Dose Seasonal PF 06/12/2018  . Influenza,inj,Quad PF,6+ Mos 05/23/2016, 06/01/2017  . Influenza-Unspecified 06/05/2018  . Pneumococcal Conjugate-13 07/02/2017  . Pneumococcal Polysaccharide-23 05/17/2005  . Td 10/16/2007  . Tdap 03/21/2018     Objective: Vital Signs: BP 114/63 (BP Location: Left Arm, Patient Position: Sitting, Cuff Size: Large)   Pulse 76   Resp 14   Ht 5' 2.5" (1.588 m)   Wt 239 lb (108.4 kg)   BMI 43.02 kg/m    Physical Exam Vitals signs and nursing note reviewed.  Constitutional:  Appearance: She is well-developed.  HENT:     Head: Normocephalic and atraumatic.  Eyes:     Conjunctiva/sclera: Conjunctivae normal.  Neck:     Musculoskeletal: Normal range of motion.  Cardiovascular:     Rate and Rhythm: Normal rate and regular rhythm.     Heart sounds: Normal heart sounds.  Pulmonary:     Effort: Pulmonary effort is normal.     Breath sounds: Normal breath sounds.  Abdominal:     General: Bowel sounds are normal.     Palpations: Abdomen is soft.  Lymphadenopathy:     Cervical: No cervical adenopathy.  Skin:    General: Skin is warm and dry.     Capillary Refill: Capillary refill takes less than 2 seconds.  Neurological:     Mental Status: She is alert and oriented to person, place, and time.  Psychiatric:        Behavior: Behavior normal.      Musculoskeletal Exam: Generalized hyperalgesia and positive tender points on exam.  C-spine limited range of motion with lateral rotation.  She has limited range of motion lumbar spine with some  discomfort.  Thoracolumbar scoliosis noted.  Shoulder joints and elbow joints have good ROM.  Limited ROM of both wrist joints.  She is synovial thickening of MCP and PIP joints.  No synovitis was noted.  Hip joints have good range of motion no discomfort.  Right knee replacement has good range of motion with no warmth or effusion.  Left knee crepitus noted.  CDAI Exam: CDAI Score: 1  Patient Global: 5 mm; Provider Global: 5 mm Swollen: 0 ; Tender: 0  Joint Exam   No joint exam has been documented for this visit   There is currently no information documented on the homunculus. Go to the Rheumatology activity and complete the homunculus joint exam.  Investigation: No additional findings.  Imaging: No results found.  Recent Labs: Lab Results  Component Value Date   WBC 6.1 08/20/2018   HGB 13.5 08/20/2018   PLT 266 08/20/2018   NA 143 08/20/2018   K 4.1 08/20/2018   CL 103 08/20/2018   CO2 23 08/20/2018   GLUCOSE 86 08/20/2018   BUN 11 08/20/2018   CREATININE 0.69 08/20/2018   BILITOT 0.2 08/20/2018   ALKPHOS 110 08/20/2018   AST 18 08/20/2018   ALT 14 08/20/2018   PROT 6.5 08/20/2018   ALBUMIN 4.0 08/20/2018   CALCIUM 9.2 08/20/2018   GFRAA 105 08/20/2018    Speciality Comments: PLQ Eye Exam:01/29/2019 @ [facility] follow up in 1 year  Procedures:  No procedures performed Allergies: Sulfa antibiotics, Cymbalta [duloxetine hcl], Demerol, Ivp dye [iodinated diagnostic agents], Morphine and related, Sulfasalazine, and Adhesive [tape]       Assessment / Plan:     Visit Diagnoses: Rheumatoid arthritis involving multiple sites with positive rheumatoid factor ([place]) - Positive RF, positive ANA. - She has no synovitis on exam.  She has not had any recent rheumatoid arthritis flares.  She is clinically doing well on Arava 20 mg 1 tablet by mouth daily and Plaquenil 200 mg 1 tablet by mouth twice daily Monday through Friday.  She has synovial thickening of all MCP and  PIP joints.  No tenderness was noted.  She does have intermittent pain in bilateral hands and bilateral wrist joints.  She has occasional pain in bilateral knee joints.  Her right knee replacement is doing well with good range of motion no warmth or effusion.  Left  knee has crepitus and some discomfort with extension but no warmth or effusion was noted.  She is having a tooth extracted and implant placed on Wednesday and was advised to hold [place] prior to the procedure.  She is currently taking antibiotics for a tooth infection.  We discussed that anytime she has an infection or is placed on antibiotics she is to hold Regino Ramirez and resume once the infection is completely cleared.  She was advised to be cleared by her oral surgeon prior to restarting on Arava.  She advised to notify us if shops increase joint pain or joint swelling.  She will follow-up in the office in 5 months.  High risk medication use -Arava 20 mg daily and Plaquenil 200 mg 1 tablet by mouth twice daily Monday through Friday. D/c IV orencia due to cost. Last Plaquenil eye exam normal on 10/16/2017.  Most recent CBC/CMP within normal limits on 08/20/2018.  Due for CBC/CMP today and will monitor every 3 months.  Standing orders placed.  She received her flu vaccine in October and previously Prevnar 13 and Pneumovax 23.   Plan: COMPLETE METABOLIC PANEL WITH GFR, CBC with Differential/Platelet  Sjogren's syndrome with keratoconjunctivitis sicca ([place]) - She continues to have chronic sicca symptoms.  She is taking plaquenil 200 mg 1 tablet by mouth BID M-F.  She has been experiencing recurrent tooth infections and has had have several root canals.  She is having a tooth extraction on Wednesday and will be having implant placed.  We discussed the importance of holding [place] while on antibiotics prior to the procedure.  We discussed the importance of good oral hygiene.  She will continue taking Plaquenil as prescribed.  Raynaud's disease without gangrene -  She has intermittent symptoms of Raynaud's.  We discussed importance of keeping her core body temperature warm and wearing gloves as needed.  No digital ulcerations or signs of gangrene were noted.  We discussed avoiding triggers of her symptoms.   Unilateral primary osteoarthritis, left knee - She has chronic left knee joint pain.  She has discomfort with extension and left knee joint crepitus. No warmth or effusion was noted.   History of total knee replacement, right - Doing well.  Good ROM with no discomfort.  No warmth or effusion.   Fibromyalgia - She has generalized hyperalgesia and positive tender points on exam.  She has generalized muscle aches muscle tenderness due to fibromyalgia.  She continues to have intermittent fibromyalgia flares.  She takes Zanaflex 4 mg by mouth at bedtime as needed for muscle spasms and hydrocodone as prescribed for pain relief.  She was encouraged to stay active and exercise on a regular basis.  Medication monitoring encounter -UDS was ordered today on 02/10/19.  Plan: Pain Mgmt, Profile 5 w/Conf, U  Osteopenia of right hip -DEXA 01/18/17: The BMD measured at Femur Neck Right is 0.783 g/cm2 with a T-score of -1.8.   A future order for DEXA was placed today.  She continues to take vitamin D 50,000 units by mouth once weekly.  Plan: DG BONE DENSITY (DXA)   DDD (degenerative disc disease), lumbar -She has limited ROM with some discomfort.  She has thoracolumbar scoliosis.  She experiences pain when standing for prolonged periods of time.   Other medical conditions are listed as follows:   History of hyperlipidemia  History of peripheral neuropathy   History of diverticulitis   History of diabetes mellitus   History of depression   Eosinophilic esophagitis  History of cardiac  murmur   Orders: Orders Placed This Encounter  Procedures  . DG BONE DENSITY (DXA)  . COMPLETE METABOLIC PANEL WITH GFR  . CBC with Differential/Platelet  . Pain Mgmt, Profile  5 w/Conf, U   No orders of the defined types were placed in this encounter.   Face-to-face time spent with patient was 30 minutes. Greater than 50% of time was spent in counseling and coordination of care.  Follow-Up Instructions: Return in about 5 months (around 07/13/2019) for Rheumatoid arthritis, Osteoarthritis, Fibromyalgia.   Ofilia Neas, PA-C  Note - This record has been created using Dragon software.  Chart creation errors have been sought, but may not always  have been located. Such creation errors do not reflect on  the standard of medical care.

## 2019-01-29 ENCOUNTER — Encounter: Payer: Self-pay | Admitting: Family Medicine

## 2019-01-29 DIAGNOSIS — H40013 Open angle with borderline findings, low risk, bilateral: Secondary | ICD-10-CM | POA: Diagnosis not present

## 2019-01-29 DIAGNOSIS — H524 Presbyopia: Secondary | ICD-10-CM | POA: Diagnosis not present

## 2019-01-29 DIAGNOSIS — H35033 Hypertensive retinopathy, bilateral: Secondary | ICD-10-CM | POA: Diagnosis not present

## 2019-01-29 DIAGNOSIS — M069 Rheumatoid arthritis, unspecified: Secondary | ICD-10-CM | POA: Diagnosis not present

## 2019-01-29 DIAGNOSIS — Z79899 Other long term (current) drug therapy: Secondary | ICD-10-CM | POA: Diagnosis not present

## 2019-02-04 ENCOUNTER — Other Ambulatory Visit: Payer: Self-pay

## 2019-02-04 ENCOUNTER — Other Ambulatory Visit: Payer: Self-pay | Admitting: Family Medicine

## 2019-02-04 ENCOUNTER — Other Ambulatory Visit: Payer: Self-pay | Admitting: Rheumatology

## 2019-02-04 ENCOUNTER — Other Ambulatory Visit: Payer: Self-pay | Admitting: Adult Health

## 2019-02-04 DIAGNOSIS — I10 Essential (primary) hypertension: Secondary | ICD-10-CM

## 2019-02-04 DIAGNOSIS — I5032 Chronic diastolic (congestive) heart failure: Secondary | ICD-10-CM

## 2019-02-04 DIAGNOSIS — E782 Mixed hyperlipidemia: Secondary | ICD-10-CM

## 2019-02-04 MED ORDER — TIZANIDINE HCL 4 MG PO TABS: 4.0000 mg | ORAL_TABLET | Freq: Every day | ORAL | 0 refills | Status: DC

## 2019-02-04 MED ORDER — HYDROCODONE-ACETAMINOPHEN 5-325 MG PO TABS: 1.0000 | ORAL_TABLET | Freq: Every evening | ORAL | 0 refills | Status: DC | PRN

## 2019-02-04 NOTE — Telephone Encounter (Addendum)
Last Visit: 07/16/18 Next Visit: 02/10/19 Labs: 08/20/18 WNL  Patient to update labs at appointment on 02/10/19  Okay to refill 30 day supply per Dr. Estanislado Pandy

## 2019-02-04 NOTE — Telephone Encounter (Signed)
Prescription refill request sent to office on 02/04/19 was from [facility] in [place]. Prescription refill sent to that pharmacy. Patient states pharmacy refuses to call and have prescription transferred. Contacted [facility] and cancelled Tizanidine prescription. Sent prescription of Tizanidine to the requested pharmacy.  Patient is also requesting a refill on Hydrocodone. Patient will be in the office 02/10/19 for an appointment and will update labs then.   Last Visit: 07/16/18 Next Visit: 02/10/19 UDS: 07/16/18 Narc Agreement: 07/16/18  Okay to refill Hydrocodone?

## 2019-02-04 NOTE — Telephone Encounter (Signed)
Last Visit: 07/16/18 Next Visit: 02/10/19  Okay to refill per Dr. Estanislado Pandy

## 2019-02-04 NOTE — Telephone Encounter (Signed)
Patient left message requesting a refill of tizanidine 4mg  to be sent to [facility].

## 2019-02-04 NOTE — Telephone Encounter (Signed)
Patient advised she is due for UDS and narc agreement every 3 months. Patient will update on 02/10/19 and then again in September 2020

## 2019-02-04 NOTE — Telephone Encounter (Signed)
She should get UDS every 3 months.

## 2019-02-10 ENCOUNTER — Other Ambulatory Visit: Payer: Self-pay

## 2019-02-10 ENCOUNTER — Ambulatory Visit (INDEPENDENT_AMBULATORY_CARE_PROVIDER_SITE_OTHER): Payer: PPO | Admitting: Physician Assistant

## 2019-02-10 ENCOUNTER — Encounter: Payer: Self-pay | Admitting: Physician Assistant

## 2019-02-10 VITALS — BP 114/63 | HR 76 | Resp 14 | Ht 62.5 in | Wt 239.0 lb

## 2019-02-10 DIAGNOSIS — M1712 Unilateral primary osteoarthritis, left knee: Secondary | ICD-10-CM | POA: Diagnosis not present

## 2019-02-10 DIAGNOSIS — I7300 Raynaud's syndrome without gangrene: Secondary | ICD-10-CM

## 2019-02-10 DIAGNOSIS — M810 Age-related osteoporosis without current pathological fracture: Secondary | ICD-10-CM | POA: Diagnosis not present

## 2019-02-10 DIAGNOSIS — M3501 Sicca syndrome with keratoconjunctivitis: Secondary | ICD-10-CM | POA: Diagnosis not present

## 2019-02-10 DIAGNOSIS — M5136 Other intervertebral disc degeneration, lumbar region: Secondary | ICD-10-CM

## 2019-02-10 DIAGNOSIS — Z8719 Personal history of other diseases of the digestive system: Secondary | ICD-10-CM | POA: Diagnosis not present

## 2019-02-10 DIAGNOSIS — Z96651 Presence of right artificial knee joint: Secondary | ICD-10-CM

## 2019-02-10 DIAGNOSIS — M797 Fibromyalgia: Secondary | ICD-10-CM

## 2019-02-10 DIAGNOSIS — Z8659 Personal history of other mental and behavioral disorders: Secondary | ICD-10-CM

## 2019-02-10 DIAGNOSIS — Z5181 Encounter for therapeutic drug level monitoring: Secondary | ICD-10-CM | POA: Diagnosis not present

## 2019-02-10 DIAGNOSIS — Z8639 Personal history of other endocrine, nutritional and metabolic disease: Secondary | ICD-10-CM

## 2019-02-10 DIAGNOSIS — Z79899 Other long term (current) drug therapy: Secondary | ICD-10-CM | POA: Diagnosis not present

## 2019-02-10 DIAGNOSIS — K200 Eosinophilic esophagitis: Secondary | ICD-10-CM

## 2019-02-10 DIAGNOSIS — M0579 Rheumatoid arthritis with rheumatoid factor of multiple sites without organ or systems involvement: Secondary | ICD-10-CM | POA: Diagnosis not present

## 2019-02-10 DIAGNOSIS — Z8669 Personal history of other diseases of the nervous system and sense organs: Secondary | ICD-10-CM | POA: Diagnosis not present

## 2019-02-10 DIAGNOSIS — Z8679 Personal history of other diseases of the circulatory system: Secondary | ICD-10-CM

## 2019-02-10 DIAGNOSIS — M85851 Other specified disorders of bone density and structure, right thigh: Secondary | ICD-10-CM

## 2019-02-11 NOTE — Progress Notes (Signed)
CBC and CMP WNL

## 2019-02-13 LAB — CBC WITH DIFFERENTIAL/PLATELET
Absolute Monocytes: 485 cells/uL (ref 200–950)
Basophils Absolute: 62 cells/uL (ref 0–200)
Basophils Relative: 1.3 %
Eosinophils Absolute: 110 cells/uL (ref 15–500)
Eosinophils Relative: 2.3 %
HCT: 40.6 % (ref 35.0–45.0)
Hemoglobin: 13.5 g/dL (ref 11.7–15.5)
Lymphs Abs: 1157 cells/uL (ref 850–3900)
MCH: 28.4 pg (ref 27.0–33.0)
MCHC: 33.3 g/dL (ref 32.0–36.0)
MCV: 85.3 fL (ref 80.0–100.0)
MPV: 11.7 fL (ref 7.5–12.5)
Monocytes Relative: 10.1 %
Neutro Abs: 2986 cells/uL (ref 1500–7800)
Neutrophils Relative %: 62.2 %
Platelets: 184 10*3/uL (ref 140–400)
RBC: 4.76 10*6/uL (ref 3.80–5.10)
RDW: 13.2 % (ref 11.0–15.0)
Total Lymphocyte: 24.1 %
WBC: 4.8 10*3/uL (ref 3.8–10.8)

## 2019-02-13 LAB — COMPLETE METABOLIC PANEL WITH GFR
AG Ratio: 1.8 (calc) (ref 1.0–2.5)
ALT: 20 U/L (ref 6–29)
AST: 21 U/L (ref 10–35)
Albumin: 4.1 g/dL (ref 3.6–5.1)
Alkaline phosphatase (APISO): 86 U/L (ref 37–153)
BUN: 19 mg/dL (ref 7–25)
CO2: 25 mmol/L (ref 20–32)
Calcium: 9.5 mg/dL (ref 8.6–10.4)
Chloride: 107 mmol/L (ref 98–110)
Creat: 0.76 mg/dL (ref 0.50–0.99)
GFR, Est African American: 95 mL/min/{1.73_m2} (ref >=60)
GFR, Est Non African American: 82 mL/min/{1.73_m2} (ref >=60)
Globulin: 2.3 g/dL (calc) (ref 1.9–3.7)
Glucose, Bld: 94 mg/dL (ref 65–99)
Potassium: 4.1 mmol/L (ref 3.5–5.3)
Sodium: 142 mmol/L (ref 135–146)
Total Bilirubin: 0.4 mg/dL (ref 0.2–1.2)
Total Protein: 6.4 g/dL (ref 6.1–8.1)

## 2019-02-13 LAB — PAIN MGMT, PROFILE 5 W/CONF, U
Amphetamines: NEGATIVE ng/mL
Barbiturates: NEGATIVE ng/mL
Benzodiazepines: NEGATIVE ng/mL
Cocaine Metabolite: NEGATIVE ng/mL
Codeine: NEGATIVE ng/mL
Creatinine: 139 mg/dL
Hydrocodone: 151 ng/mL
Hydromorphone: 68 ng/mL
Marijuana Metabolite: NEGATIVE ng/mL
Methadone Metabolite: NEGATIVE ng/mL
Morphine: NEGATIVE ng/mL
Norhydrocodone: 387 ng/mL
Opiates: POSITIVE ng/mL
Oxidant: NEGATIVE ug/mL
Oxycodone: NEGATIVE ng/mL
pH: 5.1 (ref 4.5–9.0)

## 2019-02-13 NOTE — Progress Notes (Signed)
Labs are stable.

## 2019-02-27 ENCOUNTER — Telehealth: Payer: Self-pay | Admitting: Family Medicine

## 2019-02-27 DIAGNOSIS — E2839 Other primary ovarian failure: Secondary | ICD-10-CM

## 2019-02-27 NOTE — Telephone Encounter (Signed)
Order placed. MPulliam, CMA/RT(R)

## 2019-02-27 NOTE — Telephone Encounter (Signed)
Placed standing order for DEXA scan. MPulliam, CMA/RT(R)

## 2019-02-27 NOTE — Addendum Note (Signed)
Addended by: Lanier Prude D on: 02/27/2019 04:04 PM   Modules accepted: Orders

## 2019-02-27 NOTE — Telephone Encounter (Signed)
Patient is due for a bone density exam (per pt) can I please place an order for this and let [name] know when it is done (dexa does not come to [place]). Thanks

## 2019-03-01 ENCOUNTER — Other Ambulatory Visit: Payer: Self-pay | Admitting: Family Medicine

## 2019-03-01 DIAGNOSIS — Z1231 Encounter for screening mammogram for malignant neoplasm of breast: Secondary | ICD-10-CM

## 2019-03-17 ENCOUNTER — Telehealth: Payer: Self-pay | Admitting: Rheumatology

## 2019-03-17 NOTE — Telephone Encounter (Signed)
Patient request a refill on Hydrocodone sent to [facility].

## 2019-03-17 NOTE — Telephone Encounter (Signed)
Last Visit: 02/10/2019 Next Visit: 07/14/2019 UDS: 02/10/2019  Narc Agreement: 07/16/2018  Last fill: 02/04/2019 #30   Okay to refill hydrocodone?

## 2019-03-18 MED ORDER — HYDROCODONE-ACETAMINOPHEN 5-325 MG PO TABS: 1.0000 | ORAL_TABLET | Freq: Every evening | ORAL | 0 refills | Status: DC | PRN

## 2019-03-19 DIAGNOSIS — G4733 Obstructive sleep apnea (adult) (pediatric): Secondary | ICD-10-CM | POA: Diagnosis not present

## 2019-03-20 ENCOUNTER — Other Ambulatory Visit: Payer: Self-pay | Admitting: Rheumatology

## 2019-03-21 NOTE — Telephone Encounter (Signed)
Last Visit: 02/10/2019 Next Visit: 07/14/2019 Labs: 02/10/19 WNL  Okay to refill per Dr. Estanislado Pandy

## 2019-03-31 ENCOUNTER — Ambulatory Visit: Payer: PPO | Admitting: Adult Health

## 2019-04-29 ENCOUNTER — Encounter: Payer: Self-pay | Admitting: Family Medicine

## 2019-04-29 ENCOUNTER — Other Ambulatory Visit: Payer: Self-pay

## 2019-04-29 ENCOUNTER — Ambulatory Visit (INDEPENDENT_AMBULATORY_CARE_PROVIDER_SITE_OTHER): Payer: PPO | Admitting: Family Medicine

## 2019-04-29 VITALS — HR 72 | Temp 97.8°F | Resp 16 | Ht 62.5 in | Wt 229.0 lb

## 2019-04-29 DIAGNOSIS — B029 Zoster without complications: Secondary | ICD-10-CM | POA: Diagnosis not present

## 2019-04-29 DIAGNOSIS — Z0000 Encounter for general adult medical examination without abnormal findings: Secondary | ICD-10-CM

## 2019-04-29 DIAGNOSIS — M359 Systemic involvement of connective tissue, unspecified: Secondary | ICD-10-CM | POA: Diagnosis not present

## 2019-04-29 DIAGNOSIS — Z23 Encounter for immunization: Secondary | ICD-10-CM | POA: Diagnosis not present

## 2019-04-29 MED ORDER — ZOSTER VAC RECOMB ADJUVANTED 50 MCG/0.5ML IM SUSR: 0.5000 mL | Freq: Once | INTRAMUSCULAR | 0 refills | Status: AC

## 2019-04-29 NOTE — Progress Notes (Signed)
Subjective:   [name] is a 68 y.o. female who presents for Medicare Annual (Subsequent) preventive examination.  [address] in Nov 2019- due to accident.        Objective:    Vitals: Pulse 72   Temp 97.8 F (36.6 C)   Resp 16   Ht 5' 2.5" (1.588 m)   Wt 229 lb (103.9 kg)   BMI 41.22 kg/m   Body mass index is 41.22 kg/m.  Advanced Directives 09/26/2017 03/20/2017 02/03/2017 01/31/2017 01/19/2016 06/24/2014 06/21/2011  Does Patient Have a Medical Advance Directive? Yes Yes No No;Yes Yes Yes Patient would not like information  Type of Advance Directive [place];Living will [place];Living will - Healthcare Power of [place] - -  Does patient want to make changes to medical advance directive? - No - Patient declined - - No - Patient declined - -  Copy of [place] in Chart? - (No Data) - No - copy requested - No - copy requested -    Tobacco Social History   Tobacco Use  Smoking Status Never Smoker  Smokeless Tobacco Never Used     Counseling given: Not Answered     Past Medical History:  Diagnosis Date  . Arthritis    Rheumatoid arthritis,   . Blood transfusion    1981  . Chronic diastolic CHF (congestive heart failure) (HCC)    diastolic   . Fibromyalgia   . Heart murmur    as a child  . Hiatal hernia    sjorgens syndrome  . OSA (obstructive sleep apnea)   . Peripheral autonomic neuropathy of unknown cause   . Pneumonia 07/2018  . Urticaria    Past Surgical History:  Procedure Laterality Date  . ABDOMINAL HYSTERECTOMY     BTL, BSO  . ADENOIDECTOMY    . BREAST SURGERY     mass removal   . CHOLECYSTECTOMY    . dental implants    . DIAGNOSTIC LAPAROSCOPY     x3  . KNEE ARTHROSCOPY     x2  . MASS EXCISION Left 03/22/2017   Procedure: EXCISION OF LEFT BREAST MASS;  Surgeon: Donnie Mesa, MD;  Location: [facility];  Service: General;  Laterality: Left;  . patotid cystectomy     . TENDON REPAIR  1980   left ankle and tibia  . TONSILLECTOMY    . TOTAL KNEE ARTHROPLASTY  06/21/2011   Procedure: TOTAL KNEE ARTHROPLASTY;  Surgeon: Gearlean Alf;  Location: [facility];  Service: Orthopedics;  Laterality: Right;  . TUBAL LIGATION     Family History  Problem Relation Age of Onset  . Alzheimer's disease Mother   . Heart attack Mother   . Hypertension Mother   . Glaucoma Mother   . Cancer Father   . Heart attack Brother   . Heart disease Brother   . Glaucoma Brother   . Hyperlipidemia Brother   . Glaucoma Brother   . Asthma Son   . Allergic rhinitis Neg Hx   . Angioedema Neg Hx   . Eczema Neg Hx   . Urticaria Neg Hx    Social History   Socioeconomic History  . Marital status: Widowed    Spouse name: Not on file  . Number of children: 2  . Years of education: AS  . Highest education level: Not on file  Occupational History  . Occupation: disability  . Occupation: Therapist, sports  Social Needs  .  Financial resource strain: Not on file  . Food insecurity    Worry: Not on file    Inability: Not on file  . Transportation needs    Medical: Not on file    Non-medical: Not on file  Tobacco Use  . Smoking status: Never Smoker  . Smokeless tobacco: Never Used  Substance and Sexual Activity  . Alcohol use: Yes    Comment: socially   . Drug use: No  . Sexual activity: Never  Lifestyle  . Physical activity    Days per week: Not on file    Minutes per session: Not on file  . Stress: Not on file  Relationships  . Social Herbalist on phone: Not on file    Gets together: Not on file    Attends religious service: Not on file    Active member of club or organization: Not on file    Attends meetings of clubs or organizations: Not on file    Relationship status: Not on file  Other Topics Concern  . Not on file  Social History Narrative   Lives at home alone   Right-handed   Drinks 1 or less cups of coffee and 2 servings of either tea or soda per day     Outpatient Encounter Medications as of 04/29/2019  Medication Sig  . aspirin EC 81 MG tablet Take 81 mg by mouth at bedtime.  [name] atorvastatin (LIPITOR) 20 MG tablet TAKE 1 TABLET BY MOUTH DAILY.  . carbamazepine (TEGRETOL) 200 MG tablet Take 1 tablet (200 mg total) by mouth at bedtime.  . clindamycin (CLEOCIN) 300 MG capsule   . cycloSPORINE (RESTASIS) 0.05 % ophthalmic emulsion Place 2 drops into both eyes 2 (two) times daily.   . diphenoxylate-atropine (LOMOTIL) 2.5-0.025 MG per tablet Take 1 tablet by mouth 4 (four) times daily as needed for diarrhea or loose stools.  . fluticasone (FLONASE) 50 MCG/ACT nasal spray Use 1 spray per nostril 1-2 times daily as needed  . furosemide (LASIX) 20 MG tablet Take 1 tablet (20 mg total) by mouth daily as needed.  . gabapentin (NEURONTIN) 300 MG capsule TAKE 2 CAPSULES IN THE MORNING AND 3 CAPSULES AT BEDTIME  . HYDROcodone-acetaminophen (NORCO/VICODIN) 5-325 MG tablet Take 1 tablet by mouth at bedtime as needed.  . hydroxychloroquine (PLAQUENIL) 200 MG tablet TAKE 1 TABLET BY MOUTH TWICE A DAY MONDAY THROUGH FRIDAY.  . hyoscyamine (ANASPAZ) 0.125 MG TBDP disintergrating tablet Place 0.25 mg under the tongue.  [name] ibuprofen (ADVIL) 800 MG tablet   . leflunomide (ARAVA) 20 MG tablet TAKE 1 TABLET BY MOUTH DAILY.  [name] levocetirizine (XYZAL) 5 MG tablet TAKE 1 TABLET BY MOUTH EVERY EVENING.  [name] lidocaine (LIDODERM) 5 % PLACE 1 PATCH ONTO THE SKIN DAILY AS NEEDED (FOR PAIN). REMOVE & DISCARD PATCH WITHIN 12 HOURS OR AS DIRECTED BY MD  . metoprolol succinate (TOPROL-XL) 25 MG 24 hr tablet TAKE 1 TABLET BY MOUTH DAILY.  . Multiple Vitamin (MULTIVITAMIN WITH MINERALS) TABS tablet Take 1 tablet by mouth daily.  . NONFORMULARY OR COMPOUNDED ITEM Triamcinolone 0.1% & Silvadene cream 1:1- Apply as directed to affected areas  . predniSONE (DELTASONE) 1 MG tablet   . promethazine (PHENERGAN) 25 MG tablet TAKE 1 TABLET BY MOUTH EVERY 6 HOURS AS NEEDED.  [name]  spironolactone (ALDACTONE) 25 MG tablet Take 1 tablet (25 mg total) by mouth 2 (two) times daily.  [name] tiZANidine (ZANAFLEX) 4 MG tablet Take 1 tablet (4 mg total)  by mouth at bedtime.  [name] UNABLE TO FIND CPAP: AT bedtime; setting is "12"  . Vitamin D, Ergocalciferol, (DRISDOL) 1.25 MG (50000 UT) CAPS capsule TAKE 1 CAPSULE BY MOUTH EVERY 7 DAYS.  [name] Zoster Vaccine Adjuvanted [facility]) injection Inject 0.5 mLs into the muscle once for 1 dose.   No facility-administered encounter medications on file as of 04/29/2019.     Activities of Daily Living In your present state of health, do you have any difficulty performing the following activities: 04/29/2019  Hearing? N  Vision? N  Difficulty concentrating or making decisions? N  Walking or climbing stairs? Y  Dressing or bathing? N  Doing errands, shopping? N  Some recent data might be hidden    Patient Care Team: Mellody Dance, DO as PCP - General (Family Medicine) Sueanne Margarita, MD as PCP - Cardiology (Cardiology) Bo Merino, MD as Consulting Physician (Rheumatology) Sueanne Margarita, MD as Consulting Physician (Cardiology) Gaynelle Arabian, MD as Consulting Physician (Orthopedic Surgery) Kathrynn Ducking, MD as Consulting Physician (Neurology) Juanita Craver, MD as Consulting Physician (Gastroenterology) Martinique, Amy, MD as Consulting Physician (Dermatology) Corliss Parish, MD as Consulting Physician (Nephrology) Bobbitt, Sedalia Muta, MD as Consulting Physician (Allergy and Immunology)     Assessment:   This is a routine wellness examination for [employer].  Exercise Activities and Dietary recommendations    Goals   None     Fall Risk Fall Risk  04/29/2019 04/29/2019 09/02/2018 03/21/2018 11/20/2017  Falls in the past year? 1 1 1  No No  Number falls in past yr: 1 1 0 - -  Injury with Fall? 1 1 1  - -  Risk for fall due to : - - Other (Comment) - -  Risk for fall due to: Comment - - fell on water onto back, fx vertebrae,  bruises - -   Is the patient's home free of loose throw rugs in walkways, pet beds, electrical cords, etc?   yes      Grab bars in the bathroom? yes      Handrails on the stairs?   no stairs inside      Adequate lighting?   yes  Timed Get Up and Go performed: over the phone  Depression Screen PHQ 2/9 Scores 04/29/2019 08/20/2018 04/25/2018 03/21/2018  PHQ - 2 Score 0 0 0 2  PHQ- 9 Score 0 3 2 5       Depression screen [facility] 2/9 04/29/2019 08/20/2018 04/25/2018 03/21/2018 11/20/2017  Decreased Interest 0 0 0 1 0  Down, Depressed, Hopeless 0 0 0 1 0  PHQ - 2 Score 0 0 0 2 0  Altered sleeping 0 1 1 1 1   Tired, decreased energy 0 1 1 1  0  Change in appetite 0 1 0 1 0  Feeling bad or failure about yourself  0 0 0 0 0  Trouble concentrating 0 0 0 0 0  Moving slowly or fidgety/restless 0 0 0 0 0  Suicidal thoughts 0 0 0 0 0  PHQ-9 Score 0 3 2 5 1   Difficult doing work/chores Not difficult at all Not difficult at all Not difficult at all - Not difficult at all     Cognitive Function   6CIT Screen 04/29/2019  What Year? 0 points  What month? 0 points  What time? 0 points  Count back from 20 0 points  Months in reverse 0 points  Repeat phrase 0 points  Total Score 0    Immunization History  Administered Date(s) Administered  .  Influenza, High Dose Seasonal PF 06/12/2018  . Influenza,inj,Quad PF,6+ Mos 05/23/2016, 06/01/2017  . Influenza-Unspecified 06/05/2018  . Pneumococcal Conjugate-13 07/02/2017  . Pneumococcal Polysaccharide-23 05/17/2005  . Td 10/16/2007  . Tdap 03/21/2018    Qualifies for Shingles Vaccine?  C/I per pt by her Rheum doc-  They won't let her have one due to her immune system.    Screening Tests Health Maintenance  Topic Date Due  . INFLUENZA VACCINE  03/15/2019  . MAMMOGRAM  08/21/2019 (Originally 05/18/2018)  . PNA vac Low Risk Adult (2 of 2 - PPSV23) 08/21/2019 (Originally 07/02/2018)  . COLONOSCOPY  11/21/2022  . TETANUS/TDAP  03/21/2028  . DEXA SCAN   Completed  . Hepatitis C Screening  Completed    Cancer Screenings: Lung: Low Dose CT Chest recommended if Age 12-80 years, 30 pack-year currently smoking OR have quit w/in 15years. Patient does not qualify. Breast:  Up to date on Mammogram? Yes , gets them done in August/September each year Up to date of Bone Density/Dexa? Yes, she has a scheduled for the end of this month.  She will get this done along with her mammogram. Colorectal: sees GI- Last colonoscopy was in for 2014 done by Dr. Collene Mares was within normal limits told to repeat in 10 years.   Additional Screenings: no: Hepatitis C Screening:       Plan:    I have personally reviewed and noted the following in the patient's chart:   . Medical and social history . Use of alcohol, tobacco or illicit drugs  . Current medications and supplements . Functional ability and status . Nutritional status . Physical activity . Advanced directives . List of other physicians . Hospitalizations, surgeries, and ER visits in previous 12 months . Vitals . Screenings to include cognitive, depression, and falls . Referrals and appointments  In addition, I have reviewed and discussed with patient certain preventive protocols, quality metrics, and best practice recommendations. A written personalized care plan for preventive services as well as general preventive health recommendations were provided to patient.     Mellody Dance, DO  04/29/2019

## 2019-05-01 ENCOUNTER — Other Ambulatory Visit: Payer: Self-pay | Admitting: Rheumatology

## 2019-05-01 DIAGNOSIS — I7300 Raynaud's syndrome without gangrene: Secondary | ICD-10-CM

## 2019-05-01 DIAGNOSIS — M3501 Sicca syndrome with keratoconjunctivitis: Secondary | ICD-10-CM

## 2019-05-01 DIAGNOSIS — M0579 Rheumatoid arthritis with rheumatoid factor of multiple sites without organ or systems involvement: Secondary | ICD-10-CM

## 2019-05-02 NOTE — Telephone Encounter (Signed)
Last Visit:02/10/2019 Next Visit:07/14/2019 Labs: 02/10/19 WNL PLQ Eye Exam:01/29/2019   Okay to refill per Dr. Estanislado Pandy

## 2019-05-05 ENCOUNTER — Telehealth: Payer: Self-pay | Admitting: Rheumatology

## 2019-05-05 MED ORDER — HYDROCODONE-ACETAMINOPHEN 5-325 MG PO TABS: 1.0000 | ORAL_TABLET | Freq: Every evening | ORAL | 0 refills | Status: DC | PRN

## 2019-05-05 NOTE — Telephone Encounter (Signed)
Last Visit:02/10/2019 Next Visit:07/14/2019 UDS: 02/10/19 Narc Agreement: 02/10/19   Okay to refill Hydrocodone?

## 2019-05-05 NOTE — Telephone Encounter (Signed)
Patient left a message on 05/02/2019 requesting a refill on Hydrocodone.

## 2019-05-14 ENCOUNTER — Ambulatory Visit: Payer: PPO

## 2019-05-14 ENCOUNTER — Other Ambulatory Visit: Payer: PPO

## 2019-05-21 ENCOUNTER — Telehealth: Payer: Self-pay | Admitting: Family Medicine

## 2019-05-21 NOTE — Telephone Encounter (Signed)
Patient called, stating that she has never been able to get the shingles shot because of her autoimmune disease, but said that Dr. Jenetta Downer was going to speak with her rheumatologist to see if it was possible for her to receive the shingles shot this year, and she was following up on that, if she can or can't get it. Also, wanted to let you know she got her Flu shot last week at [facility]. Please Advise -- am.

## 2019-05-24 ENCOUNTER — Encounter: Payer: Self-pay | Admitting: Family Medicine

## 2019-05-24 NOTE — Telephone Encounter (Signed)
I did speak with her rheumatologist Dr. Dora Sims, and she did confirm what I told patient at last office visit.   I told pt I would get in touch with her ONLY if I was told anything different than what I already told pt.  ( I told her she can get it but if her specialist said anything different, then I would let her know.)   Patient can get the 2-shot shingles series vaccination.   There is no contraindication because of her "autoimmune disorder ".  Patient can schedule this as a "medical assistant-only visit " at her earliest convenience.   Please remind her she gets 1 injection and then has to repeat in 2-6 months in order to complete the series.   -Dr Raliegh Scarlet

## 2019-05-26 NOTE — Telephone Encounter (Signed)
Pt informed.  Pt expressed understanding and was transferred to front desk to schedule appt.  [name], CMA

## 2019-06-05 ENCOUNTER — Other Ambulatory Visit: Payer: PPO

## 2019-06-05 ENCOUNTER — Ambulatory Visit: Payer: PPO

## 2019-06-06 ENCOUNTER — Other Ambulatory Visit: Payer: Self-pay | Admitting: Rheumatology

## 2019-06-06 ENCOUNTER — Other Ambulatory Visit: Payer: Self-pay | Admitting: *Deleted

## 2019-06-06 DIAGNOSIS — R52 Pain, unspecified: Secondary | ICD-10-CM

## 2019-06-06 MED ORDER — HYDROCODONE-ACETAMINOPHEN 5-325 MG PO TABS: 1.0000 | ORAL_TABLET | Freq: Every evening | ORAL | 0 refills | Status: DC | PRN

## 2019-06-06 NOTE — Telephone Encounter (Signed)
Last Visit:02/10/2019 Next Visit:07/14/2019 Labs: 02/10/19 WNL  Patient advised she is due to update labs. Patient will update labs next week .  Okay to refill per Dr. Estanislado Pandy

## 2019-06-06 NOTE — Telephone Encounter (Signed)
Patient requested refill on Hydrocodone   Last Visit:02/10/2019 Next Visit:07/14/2019 UDS: 02/10/19 Narc Agreement: 02/10/19  Patient to update UDS and narc agreement next week.   Okay to refill Hydrocodone?

## 2019-06-09 ENCOUNTER — Ambulatory Visit: Payer: PPO

## 2019-06-10 ENCOUNTER — Ambulatory Visit (INDEPENDENT_AMBULATORY_CARE_PROVIDER_SITE_OTHER): Payer: PPO

## 2019-06-10 ENCOUNTER — Other Ambulatory Visit: Payer: Self-pay

## 2019-06-10 DIAGNOSIS — Z23 Encounter for immunization: Secondary | ICD-10-CM | POA: Diagnosis not present

## 2019-06-10 NOTE — Progress Notes (Signed)
Pt here for Shingrix vaccine.  Screening questionnaire reviewed, VIS provided to patient, and any/all patient questions answered.  Advised pt to return for 2nd Shingrix within 2-6 months.  [name], CMA

## 2019-06-17 ENCOUNTER — Encounter: Payer: Self-pay | Admitting: Family Medicine

## 2019-06-17 ENCOUNTER — Other Ambulatory Visit: Payer: Self-pay

## 2019-06-17 ENCOUNTER — Ambulatory Visit (INDEPENDENT_AMBULATORY_CARE_PROVIDER_SITE_OTHER): Payer: PPO | Admitting: Family Medicine

## 2019-06-17 VITALS — BP 145/85 | HR 75 | Temp 98.4°F | Resp 14 | Ht 62.5 in | Wt 249.3 lb

## 2019-06-17 DIAGNOSIS — Z1331 Encounter for screening for depression: Secondary | ICD-10-CM | POA: Diagnosis not present

## 2019-06-17 DIAGNOSIS — R0602 Shortness of breath: Secondary | ICD-10-CM

## 2019-06-17 DIAGNOSIS — L500 Allergic urticaria: Secondary | ICD-10-CM | POA: Diagnosis not present

## 2019-06-17 DIAGNOSIS — G4733 Obstructive sleep apnea (adult) (pediatric): Secondary | ICD-10-CM | POA: Diagnosis not present

## 2019-06-17 DIAGNOSIS — L509 Urticaria, unspecified: Secondary | ICD-10-CM

## 2019-06-17 DIAGNOSIS — R635 Abnormal weight gain: Secondary | ICD-10-CM | POA: Diagnosis not present

## 2019-06-17 DIAGNOSIS — R197 Diarrhea, unspecified: Secondary | ICD-10-CM | POA: Diagnosis not present

## 2019-06-17 DIAGNOSIS — I5032 Chronic diastolic (congestive) heart failure: Secondary | ICD-10-CM | POA: Diagnosis not present

## 2019-06-17 DIAGNOSIS — R5383 Other fatigue: Secondary | ICD-10-CM

## 2019-06-17 NOTE — Progress Notes (Signed)
Telehealth office visit note for [name], D.O- at Primary Care at [facility]   I connected with current patient today and verified that I am speaking with the correct person using two identifiers.   . Location of the patient: Home . Location of the provider: Office Only the patient (+/- their family members at pt's discretion) and myself were participating in the encounter - This visit type was conducted due to national recommendations for restrictions regarding the COVID-19 Pandemic (e.g. social distancing) in an effort to limit this patient's exposure and mitigate transmission in our community.  This format is felt to be most appropriate for this patient at this time.   - The patient did not have access to video technology or had technical difficulties with video requiring transitioning to audio format only. - No physical exam could be performed with this format, beyond that communicated to [place] by the patient/ family members as noted.   - Additionally my office staff/ schedulers discussed with the patient that there may be a monetary charge related to this service, depending on their medical insurance.   The patient expressed understanding, and agreed to proceed.       History of Present Illness:  I, [name], am serving as scribe for Dr. Mellody Cooper.  Patient today complaining of SOB, fatigue, diarrhea, and mainly concerns about rash.  Says "I've got a stomach issue anyway and it's not unusual for me to have diarrhea."  Says "I don't think this is COVID since I've gained twenty pounds in the last two months."  - Skin Rash, Hives Says she had the same type of rash back in January; was sent to allergist and "they couldn't find that I was allergic to anything."  Notes her symptoms from back then actually resolved for a while, but came back in August.  Says "the hives are not COVID; I dunno what they are."  Says her SOB and rash may be related to anxiety.  "I  don't think I'm anxious, but if I am, I want to do something about these hives."  Says these are "big circles;" "little hives and big circle hives."  Notes her hives are all over her arms "and people look at you like you've got leprosy or something, and if something can be done about it, I want it to be done."  Says the rash doesn't change no matter what she eats, what she tries to avoid eating, etc.  Reiterates that she "went to an allergist and went through all the tests."  Says she has some rash on the abdomen; there may be some on her back, but mainly her arms.  Notes "there's no pattern [to causing the outbreak] at all."  - Diarrhea, SOB, Fatigue - History of CHF Says the diarrhea has been worse this week.  Notes experiencing SOB and increasing fatigue (more than usual) for a couple of weeks.  States "the SOB started sometime last week, and the fatigue sometime last week too."  States she wakes up at 3-4 in the morning every night to go to the bathroom, and can't get back to bed until about 6-7 AM.  "I just attributed that to being more tired."  At present, with the SOB, states she cannot do some activities.  Notes her oxygen saturation is around 92, 91, usually, and gets down to 88 sometimes "but always goes back up."  Thinks her oxygen is normally around 95-96, "never higher than 97."  Says "I can't lay flat; I do have to elevate the head a little bit."  This is not new.  Uses a bed that elevates her head and feet when she sleeps.  Has been doing this for a long time.  She has not increased the elevation lately.  Regarding the quality of her fatigue, notes "I'm tired right now; I could go home and go to sleep."  She does continue using her CPAP "religiously."  Notes the swelling in her bilateral feet is "not any more than usual."  Notes she is up 20 lbs, and this weight gain has occurred in the past month.  Says "the past month, all of a sudden, it was here."  Experiences SOB globally  all day.  Notes she's never had shortness of breath until now.  Says "it's worse in the evenings," but currently experiencing a little SOB getting out to her car.    Denies chest pain.  Says she has heart palpitations; long history of PVC's.  Denies more heart palpitations than usual.  Denies dizziness any different or more than usual.  Says she has increased sweating in her upper torso/neck, worse than usual.  Thinks that this sweating occurs with activity.  Denies nausea with activities.  Denies new jaw pain or radiating arm pain.  Does not monitor her BP at home.  Notes that 145/85 is "up for me."  States she's on medication for hypertension "but I've never been diagnosed with that."  Says she takes the medication for PVC's.  States she's not been taking her lasix as often, but she takes her aldactone religiously.  Says she takes the lasix only if she feels her leg is more swollen than usual.  Says she hates that she has to pee more often while on the lasix.   No flowsheet data found.  Depression screen [facility] 2/9 06/17/2019 04/29/2019 08/20/2018 04/25/2018 03/21/2018  Decreased Interest 0 0 0 0 1  Down, Depressed, Hopeless 1 0 0 0 1  PHQ - 2 Score 1 0 0 0 2  Altered sleeping 3 0 1 1 1   Tired, decreased energy 2 0 1 1 1   Change in appetite 0 0 1 0 1  Feeling bad or failure about yourself  0 0 0 0 0  Trouble concentrating 0 0 0 0 0  Moving slowly or fidgety/restless 0 0 0 0 0  Suicidal thoughts 0 0 0 0 0  PHQ-9 Score 6 0 3 2 5   Difficult doing work/chores Not difficult at all Not difficult at all Not difficult at all Not difficult at all -  Some recent data might be hidden      Impression and Recommendations:    1. SOB (shortness of breath)   2. Other fatigue   3. Diarrhea, unspecified type   4. Localized hives- abdomen, back but mostly b/l ARMS   5. Recurrent urticaria   6. Chronic diastolic CHF (congestive heart failure) ([place])   7. Morbid obesity ([place])   8. OSA (obstructive sleep  apnea)   9. Weight gain      SOB, Fatigue, Diarrhea - Reviewed that patient's symptoms today (SOB, fatigue, diarrhea) possibly align with symptoms of COVID. - Advised obtaining COVID-19 test in near future ASAP. - Gave testing site location to patient today, [employer]. - Told patient to quarantine until she receives a negative result.    Recurrent Urticaria - Localized Hives: Abdomen, Back, Mostly Bilateral Arms - Advised referral to dermatology in near future. -  Per patient, already has 1 and follows up with Dr. Amy Martinique of [facility]. - Encouraged patient to call in and be seen by somebody in the practice.  - Extensive education provided to patient today. - Discussed that patient symptoms may absolutely be emotionally mediated. - Lengthy conversation held and all questions answered.  - Will continue to monitor.   History of CHF - New SOB, Worsening Fatigue past 1-2 weeks, Sweating, Weight Gain - Discussed red flag cardiac symptoms at length with patient today. - Advised patient to begin ambulatory BP monitoring at home.  - Encouraged patient to follow up with [facility]. - Discussed need for echocardiogram in near future to evaluate the heart. - Order for echocardiogram placed today. - Advised patient to ultimately defer to recommendations of cardiology.  - Patient agrees to call and schedule follow-up with her existing cardiology team.  - In the meantime, advised patient to: 1. Weigh herself daily,  2. Measure her BP daily, 3. Decrease salt intake.  - Will continue to monitor closely. - Patient knows to call in if symptoms worsen or change.   OSA on CPAP - Managed on CPAP - Advised patient to continue use of CPAP as established.   - As part of my medical decision making, I reviewed the following data within the [place] History obtained from pt /family, CMA notes reviewed and incorporated if applicable, Labs  reviewed, Radiograph/ tests reviewed if applicable and OV notes from prior OV's with me, as well as other specialists she/he has seen since seeing me last, were all reviewed and used in my medical decision making process today.    - Additionally, discussion had with patient regarding our treatment plan, and their biases/concerns about that plan were used in my medical decision making today.    - The patient agreed with the plan and demonstrated an understanding of the instructions.   No barriers to understanding were identified.    - Red flag symptoms and signs discussed in detail.  Patient expressed understanding regarding what to do in case of emergency\ urgent symptoms.   - The patient was advised to call back or seek an in-person evaluation if the symptoms worsen or if the condition fails to improve as anticipated.   Return for In near future if symptoms persist once negative Covid test.    Orders Placed This Encounter  Procedures  . ECHOCARDIOGRAM COMPLETE    I provided 28 minutes of non face-to-face time during this encounter.  Additional time was spent with charting and coordination of care after the actual visit commenced.   Note:  This note was prepared with assistance of Dragon voice recognition software. Occasional wrong-word or sound-a-like substitutions may have occurred due to the inherent limitations of voice recognition software.   This document serves as a record of services personally performed by Marisa Dance, DO. It was created on her behalf by [name], a trained medical scribe. The creation of this record is based on the scribe's personal observations and the provider's statements to them.   This case required medical decision making of at least moderate complexity. The above documentation has been reviewed to be accurate and was completed by Marjory Sneddon, D.O.      Patient Care Team    Relationship Specialty Notifications Start End  Marisa Dance, DO PCP - General Family Medicine  01/19/16   Sueanne Margarita, MD PCP - Cardiology Cardiology Admissions 10/15/18   Bo Merino,  MD Consulting Physician Rheumatology  02/02/14   Sueanne Margarita, MD Consulting Physician Cardiology  05/23/16   Gaynelle Arabian, MD Consulting Physician Orthopedic Surgery  05/23/16   Kathrynn Ducking, MD Consulting Physician Neurology  05/23/16   Juanita Craver, MD Consulting Physician Gastroenterology  05/23/16   Martinique, Amy, MD Consulting Physician Dermatology  05/23/16   Corliss Parish, MD Consulting Physician Nephrology  07/02/17    Comment: Seen for hypokalemia.  Bobbitt, Sedalia Muta, MD Consulting Physician Allergy and Immunology  08/20/18      -Vitals obtained; medications/ allergies reconciled;  personal medical, social, Sx etc.histories were updated by CMA, reviewed by me and are reflected in chart   Patient Active Problem List   Diagnosis Date Noted  . Elevated triglycerides with high cholesterol 08/23/2016    Priority: High  . Peripheral polyneuropathy 02/02/2014    Priority: High  . Morbid obesity ([place]) 11/17/2013    Priority: High  . OSA (obstructive sleep apnea)     Priority: High  . Low serum HDL 08/23/2016    Priority: Medium  . GERD (gastroesophageal reflux disease) 01/19/2016    Priority: Medium  . Fibromyalgia     Priority: Medium  . Sjoegren syndrome 02/29/2016    Priority: Low  . Glucose intolerance (impaired glucose tolerance) 01/19/2016    Priority: Low  . Chronic diastolic CHF (congestive heart failure) ([place]) 04/20/2015    Priority: Low  . Rheumatoid arthritis ([place])     Priority: Low  . Pain in left knee 08/23/2018  . Lumbar pain 07/04/2018  . Recurrent urticaria 05/20/2018  . Rhinitis 05/20/2018  . Vitamin D deficiency 11/20/2017  . Mixed hyperlipidemia 11/20/2017  . Family history of coronary arteriosclerosis- strong fam h/o CAD and early CAD.  07/17/2017  . Raynaud's disease without gangrene 03/12/2017   . Abnormal weight gain 02/06/2017  . Shingles outbreak 02/06/2017  . Cyst (solitary) of breast, left 01/31/2017  . Eosinophilic esophagitis 45/62/5638  . History of hyperlipidemia 10/04/2016  . 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 History of diverticulitis 09/03/2016  . Osteoporosis 09/03/2016  . Autoimmune disease ([place]) 09/02/2016  . High risk medication use 09/02/2016  . History of esophagitis 09/02/2016  . Breast cyst, left 06/14/2016  . Encounter for wellness examination 05/23/2016  . Abnormality of gait 05/09/2016  . Heart murmur   . Hiatal hernia   . PVC's (premature ventricular contractions)   . 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 Unilateral primary osteoarthritis, left knee 06/22/2011     Current Meds  Medication Sig  . aspirin EC 81 MG tablet Take 81 mg by mouth at bedtime.  [name] atorvastatin (LIPITOR) 20 MG tablet TAKE 1 TABLET BY MOUTH DAILY.  . carbamazepine (TEGRETOL) 200 MG tablet Take 1 tablet (200 mg total) by mouth at bedtime.  . cycloSPORINE (RESTASIS) 0.05 % ophthalmic emulsion Place 2 drops into both eyes 2 (two) times daily.   . diphenoxylate-atropine (LOMOTIL) 2.5-0.025 MG per tablet Take 1 tablet by mouth 4 (four) times daily as needed for diarrhea or loose stools.  . fluticasone (FLONASE) 50 MCG/ACT nasal spray Use 1 spray per nostril 1-2 times daily as needed  . furosemide (LASIX) 20 MG tablet Take 1 tablet (20 mg total) by mouth daily as needed.  . gabapentin (NEURONTIN) 300 MG capsule TAKE 2 CAPSULES IN THE MORNING AND 3 CAPSULES AT BEDTIME  . HYDROcodone-acetaminophen (NORCO/VICODIN) 5-325 MG tablet Take 1 tablet by mouth at bedtime as needed.  . hydroxychloroquine (PLAQUENIL) 200  MG tablet TAKE 1 TABLET BY MOUTH TWICE A DAY MONDAY THROUGH FRIDAY.  . hyoscyamine (ANASPAZ) 0.125 MG TBDP disintergrating tablet Place 0.25 mg under the tongue.  . leflunomide (ARAVA) 20 MG tablet TAKE 1 TABLET BY MOUTH DAILY.  [name] levocetirizine  (XYZAL) 5 MG tablet TAKE 1 TABLET BY MOUTH EVERY EVENING.  [name] lidocaine (LIDODERM) 5 % PLACE 1 PATCH ONTO THE SKIN DAILY AS NEEDED (FOR PAIN). REMOVE & DISCARD PATCH WITHIN 12 HOURS OR AS DIRECTED BY MD  . metoprolol succinate (TOPROL-XL) 25 MG 24 hr tablet TAKE 1 TABLET BY MOUTH DAILY.  . NONFORMULARY OR COMPOUNDED ITEM Triamcinolone 0.1% & Silvadene cream 1:1- Apply as directed to affected areas  . promethazine (PHENERGAN) 25 MG tablet TAKE 1 TABLET BY MOUTH EVERY 6 HOURS AS NEEDED.  [name] spironolactone (ALDACTONE) 25 MG tablet Take 1 tablet (25 mg total) by mouth 2 (two) times daily.  [name] tiZANidine (ZANAFLEX) 4 MG tablet TAKE 1 TABLET BY MOUTH AT BEDTIME.  [name] UNABLE TO FIND CPAP: AT bedtime; setting is "12"  . Vitamin D, Ergocalciferol, (DRISDOL) 1.25 MG (50000 UT) CAPS capsule TAKE 1 CAPSULE BY MOUTH EVERY 7 DAYS.     Allergies:  Allergies  Allergen Reactions  . Sulfa Antibiotics Hives and Itching    Flushing also  . Cymbalta [Duloxetine Hcl] Other (See Comments)    Caused a manic reaction  . Demerol Hives and Other (See Comments)    Fever also  . Ivp Dye [Iodinated Diagnostic Agents] Hives and Other (See Comments)    Fever also  . Morphine And Related Other (See Comments)    ineffective  . Sulfasalazine Other (See Comments)    Reaction ??  . Adhesive [Tape] Rash     ROS:  See above HPI for pertinent positives and negatives   Objective:   Blood pressure (!) 145/85, pulse 75, temperature 98.4 F (36.9 C), temperature source Oral, resp. rate 14, height 5' 2.5" (1.588 m), weight 249 lb 4.8 oz (113.1 kg), SpO2 96 %.  (if some vitals are omitted, this means that patient was UNABLE to obtain them even though they were asked to get them prior to OV today.  They were asked to call us at their earliest convenience with these once obtained. )  General: A & O * 3; sounds in no acute distress; in usual state of health.  Skin: Pt confirms warm and dry extremities and pink fingertips HEENT:  Pt confirms lips non-cyanotic Chest: Patient confirms normal chest excursion and movement Respiratory: speaking in full sentences, no conversational dyspnea; patient confirms no use of accessory muscles Psych: insight appears good, mood- appears full

## 2019-06-18 ENCOUNTER — Other Ambulatory Visit: Payer: Self-pay

## 2019-06-18 DIAGNOSIS — Z20822 Contact with and (suspected) exposure to covid-19: Secondary | ICD-10-CM

## 2019-06-19 ENCOUNTER — Other Ambulatory Visit (HOSPITAL_COMMUNITY): Payer: PPO

## 2019-06-19 LAB — NOVEL CORONAVIRUS, NAA: SARS-CoV-2, NAA: NOT DETECTED

## 2019-06-20 ENCOUNTER — Telehealth: Payer: Self-pay | Admitting: Family Medicine

## 2019-06-20 NOTE — Telephone Encounter (Signed)
Negative COVID results given. Patient results "NOT Detected." Caller expressed understanding. ° °

## 2019-06-24 ENCOUNTER — Ambulatory Visit (HOSPITAL_COMMUNITY): Payer: PPO | Attending: Internal Medicine

## 2019-06-24 ENCOUNTER — Other Ambulatory Visit: Payer: Self-pay

## 2019-06-24 ENCOUNTER — Encounter: Payer: Self-pay | Admitting: Family Medicine

## 2019-06-24 DIAGNOSIS — R5383 Other fatigue: Secondary | ICD-10-CM | POA: Diagnosis not present

## 2019-06-24 DIAGNOSIS — R0602 Shortness of breath: Secondary | ICD-10-CM

## 2019-06-24 DIAGNOSIS — I5032 Chronic diastolic (congestive) heart failure: Secondary | ICD-10-CM | POA: Insufficient documentation

## 2019-06-30 NOTE — Progress Notes (Signed)
Virtual Visit via Telephone Note  I connected with [name] on 07/14/19 at 12:30 PM EST by telephone and verified that I am speaking with the correct person using two identifiers.  Location: Patient: Home  Provider: Clinic  This service was conducted via virtual visit. The patient was located at home. I was located in my office.  Consent was obtained prior to the virtual visit and is aware of possible charges through their insurance for this visit.  The patient is an established patient.  Dr. Estanislado Pandy, MD conducted the virtual visit.  Office staff helped with scheduling follow up visits after the service was conducted.   I discussed the limitations, risks, security and privacy concerns of performing an evaluation and management service by telephone and the availability of in person appointments. I also discussed with the patient that there may be a patient responsible charge related to this service. The patient expressed understanding and agreed to proceed.  CC: Pain in both hands History of Present Illness: Patient is a 68 year old female with a past medical history of seropositive rheumatoid arthritis, Sjogren's syndrome, raynaud's, and fibromyalgia.  She is on Arava 20 mg po daily and plaquenil 200 mg 1 tablet by mouth twice daily M-F.  She has intermittent pain and swelling in both hands. She has morning stiffness for about 30 minutes every morning. She has chronic lower back pain but no symptoms of radiculopathy. She takes zanaflex 4 mg po at bedtime and hydrocodone sparingly for pain relief.  She denies any other joint pain or joint swelling at this time.  She continues to have chronic sicca symptoms.    Review of Systems  Constitutional: Positive for malaise/fatigue. Negative for fever.  HENT:       +Dry mouth  Eyes: Negative for photophobia, pain, discharge and redness.       +Dry eyes  Respiratory: Positive for shortness of breath. Negative for cough and wheezing.    Cardiovascular: Negative for chest pain and palpitations.  Gastrointestinal: Negative for blood in stool, constipation and diarrhea.  Genitourinary: Negative for dysuria.  Musculoskeletal: Positive for joint pain. Negative for back pain, myalgias and neck pain.       +Morning stiffness   Skin: Negative for rash.  Neurological: Negative for dizziness and headaches.  Psychiatric/Behavioral: Negative for depression. The patient is not nervous/anxious and does not have insomnia.       Observations/Objective: Physical Exam  Constitutional: She is oriented to person, place, and time.  Neurological: She is alert and oriented to person, place, and time.  Psychiatric: Mood, memory, affect and judgment normal.   Patient reports morning stiffness for 30-40 minutes.   Patient denies nocturnal pain.  Difficulty dressing/grooming: Denies Difficulty climbing stairs: Reports Difficulty getting out of chair: Denies Difficulty using hands for taps, buttons, cutlery, and/or writing: Denies  She currently rates her RA a 5/10.  Assessment and Plan: Visit Diagnoses: Rheumatoid arthritis involving multiple sites with positive rheumatoid factor (HCC) - Positive RF, positive ANA. -She has not had any recent rheumatoid arthritis flares.  She is clinically doing well on Arava 20 mg po daily and Plaquenil 200 mg 1 tablet by mouth twice daily M-F.  She continues to have intermittent pain and inflammation in her hands, but she has not noticed any new or worsening symptoms.  She has morning stiffness lasting about 30 minutes.  She will continue on the current treatment regimen .  She does not need any refills at this time.  She  was advised to notify us if she develops increased joint pain or joint swelling.  She will follow up in 4 months.  High risk medication use -Arava 20 mg daily and Plaquenil 200 mg 1 tablet by mouth twice daily Monday through Friday. D/c IV orencia due to cost. PLQ Eye Exam:01/29/2019 @ [facility]  Opthalmology follow up in 1 year She received her flu vaccine in October and previously Prevnar 13 and Pneumovax 23.  CBC and CMP drawn on 07/07/19.  Plts low at that time.   Sjogren's syndrome with keratoconjunctivitis sicca ([place]) - She has chronic sicca symptoms. She uses restasis eye drops as needed.    Raynaud's disease without gangrene - She has active symptoms of raynaud's.  No digital ulcerations or signs of gangrene.   Unilateral primary osteoarthritis, left knee - She has chronic left knee joint.  No joint swelling.   History of total knee replacement, right - Doing well.  She has no discomfort at this time.  Fibromyalgia -  She has occasional generalized muscle aches and muscle tenderness due to fibromyalgia. She takes Zanaflex 4 mg by mouth at bedtime as needed for muscle spasms and hydrocodone as prescribed for pain relief.  She was advised to try to taper off of hydrocodone.  We discussed the long term risks of opioid use.  She is due to update UDS.  Future order is in place.   Medication monitoring encounter -UDS was ordered on 02/10/19.  She is due to update UDS.  Future order in place.  Osteopenia of right hip -DEXA 01/18/17: The BMD measured at Femur Neck Right is 0.783 g/cm2 with a T-score of -1.8. DEXA scheduled for 08/27/19.  DDD (degenerative disc disease), lumbar -She has chronic lower back pain and stiffness.  No symptoms of radiculopathy. She uses llidoderm patches as needed for pain relief.   Other medical conditions are listed as follows:   History of hyperlipidemia  History of peripheral neuropathy   History of diverticulitis   History of diabetes mellitus   History of depression   Eosinophilic esophagitis  History of cardiac murmur   Follow Up Instructions: She will follow up in 4 months.    I discussed the assessment and treatment plan with the patient. The patient was provided an opportunity to ask questions and all were answered. The  patient agreed with the plan and demonstrated an understanding of the instructions.   The patient was advised to call back or seek an in-person evaluation if the symptoms worsen or if the condition fails to improve as anticipated.  I provided 25 minutes of non-face-to-face time during this encounter.   Bo Merino, MD   Scribed by-  Hazel Sams, PA-C

## 2019-07-04 ENCOUNTER — Other Ambulatory Visit: Payer: Self-pay | Admitting: Rheumatology

## 2019-07-07 ENCOUNTER — Other Ambulatory Visit: Payer: Self-pay

## 2019-07-07 ENCOUNTER — Encounter (HOSPITAL_COMMUNITY): Payer: Self-pay

## 2019-07-07 ENCOUNTER — Ambulatory Visit (HOSPITAL_COMMUNITY)
Admission: RE | Admit: 2019-07-07 | Discharge: 2019-07-07 | Disposition: A | Discharge status: Home / Self Care | Payer: PPO | Source: Ambulatory Visit | Attending: Cardiology | Admitting: Cardiology

## 2019-07-07 VITALS — BP 123/80 | HR 66 | Wt 245.4 lb

## 2019-07-07 DIAGNOSIS — Z91041 Radiographic dye allergy status: Secondary | ICD-10-CM | POA: Diagnosis not present

## 2019-07-07 DIAGNOSIS — Z791 Long term (current) use of non-steroidal anti-inflammatories (NSAID): Secondary | ICD-10-CM | POA: Diagnosis not present

## 2019-07-07 DIAGNOSIS — Z8249 Family history of ischemic heart disease and other diseases of the circulatory system: Secondary | ICD-10-CM | POA: Insufficient documentation

## 2019-07-07 DIAGNOSIS — Z882 Allergy status to sulfonamides status: Secondary | ICD-10-CM | POA: Diagnosis not present

## 2019-07-07 DIAGNOSIS — Z79899 Other long term (current) drug therapy: Secondary | ICD-10-CM | POA: Diagnosis not present

## 2019-07-07 DIAGNOSIS — M797 Fibromyalgia: Secondary | ICD-10-CM | POA: Diagnosis not present

## 2019-07-07 DIAGNOSIS — Z885 Allergy status to narcotic agent status: Secondary | ICD-10-CM | POA: Insufficient documentation

## 2019-07-07 DIAGNOSIS — G4733 Obstructive sleep apnea (adult) (pediatric): Secondary | ICD-10-CM | POA: Diagnosis not present

## 2019-07-07 DIAGNOSIS — Z809 Family history of malignant neoplasm, unspecified: Secondary | ICD-10-CM | POA: Diagnosis not present

## 2019-07-07 DIAGNOSIS — M069 Rheumatoid arthritis, unspecified: Secondary | ICD-10-CM | POA: Diagnosis not present

## 2019-07-07 DIAGNOSIS — I5032 Chronic diastolic (congestive) heart failure: Secondary | ICD-10-CM | POA: Insufficient documentation

## 2019-07-07 DIAGNOSIS — I080 Rheumatic disorders of both mitral and aortic valves: Secondary | ICD-10-CM | POA: Diagnosis not present

## 2019-07-07 DIAGNOSIS — Z7952 Long term (current) use of systemic steroids: Secondary | ICD-10-CM | POA: Diagnosis not present

## 2019-07-07 DIAGNOSIS — Z7982 Long term (current) use of aspirin: Secondary | ICD-10-CM | POA: Diagnosis not present

## 2019-07-07 DIAGNOSIS — Z888 Allergy status to other drugs, medicaments and biological substances status: Secondary | ICD-10-CM | POA: Insufficient documentation

## 2019-07-07 LAB — COMPREHENSIVE METABOLIC PANEL
ALT: 23 U/L (ref 0–44)
AST: 21 U/L (ref 15–41)
Albumin: 3.5 g/dL (ref 3.5–5.0)
Alkaline Phosphatase: 79 U/L (ref 38–126)
Anion gap: 10 (ref 5–15)
BUN: 17 mg/dL (ref 8–23)
CO2: 25 mmol/L (ref 22–32)
Calcium: 8.9 mg/dL (ref 8.9–10.3)
Chloride: 106 mmol/L (ref 98–111)
Creatinine, Ser: 0.9 mg/dL (ref 0.44–1.00)
GFR calc Af Amer: >60 mL/min (ref >60)
GFR calc non Af Amer: >60 mL/min (ref >60)
Glucose, Bld: 120 mg/dL — ABNORMAL HIGH (ref 70–99)
Potassium: 3.9 mmol/L (ref 3.5–5.1)
Sodium: 141 mmol/L (ref 135–145)
Total Bilirubin: 1 mg/dL (ref 0.3–1.2)
Total Protein: 6.3 g/dL — ABNORMAL LOW (ref 6.5–8.1)

## 2019-07-07 LAB — CBC
HCT: 43 % (ref 36.0–46.0)
Hemoglobin: 13.9 g/dL (ref 12.0–15.0)
MCH: 28.5 pg (ref 26.0–34.0)
MCHC: 32.3 g/dL (ref 30.0–36.0)
MCV: 88.3 fL (ref 80.0–100.0)
Platelets: 148 10*3/uL — ABNORMAL LOW (ref 150–400)
RBC: 4.87 MIL/uL (ref 3.87–5.11)
RDW: 13 % (ref 11.5–15.5)
WBC: 4.8 10*3/uL (ref 4.0–10.5)
nRBC: 0 % (ref 0.0–0.2)

## 2019-07-07 MED ORDER — TORSEMIDE 20 MG PO TABS: 40.0000 mg | ORAL_TABLET | Freq: Every day | ORAL | 3 refills | Status: DC

## 2019-07-07 NOTE — Progress Notes (Signed)
Advanced Heart Failure Clinic Note   Referring Physician: PCP: Mellody Dance, DO PCP-Cardiologist: Fransico Him, MD  AHFC: Dr. Haroldine Laws   HPI:  [name] is a 68 year old with a history of PVCs, morbid obesity, OSA on CPAP, RA, and chronic diastolic heart failure. Previously followed by Dr. Radford Pax but was referred to the [facility] in 2019 for management of diastolic HF given difficulties managing volume despite diuretics.   Had initial consult w/ Dr. Haroldine Laws. Says she started having problems with volume overload in July 2018. Has tried multiple diuretics but says nothing has worked very well except lasix and metolazone but that bottoms out her potassium. Last seen on 08/1717. At that time torsemide was increased to 60 mg twice a day due to volume overload. Weight at that time was 235 pounds. Per initial consult, there was low suspicion for TTR amyloid at the time.   Was seen in ER on 2/13 with CP. Troponin 0.0. CT chest no PE. Minimal coronary calcium. Diagnosed with pleurisy.  At last [facility] visit on 10/11/2017, her volume status was felt to be stable. At that time, her diuretic regimen included torsemide 60 mg bid + spiro 25 mg bid and 2.5 mg of metolazone PRN. She was lost to f/u for a period of time but had seen general cardiology. She now reports back to clinic w/ complaints of progressive exertional and resting dyspnea over the last 2 months. Also notes ~20 lb weight gain over that time frame. Report compliance w/ diuretics. However her diuretic regimen is completely changed and it is not well outlined when and why regimen was altered. Pt is also unsure herself but she is only on a very low dose of Lasix, 20 mg daily. She is still on spiro 25 mg bid. Abdomen is obese. She has 1+ bilateral LEE on exam, R>L. She has never had a RHC. Denies CP. She has history of polyneuropathy. Recent 2D echo 11/10 showed normal LVEF, moderate LVH, G1DD, normal RV and normal R/LA size. No significant valvular  disease. No estimated RVSP on report.     ECHO 06/24/2019  1. Left ventricular ejection fraction, by visual estimation, is 60 to 65%. The left ventricle has normal function. There is moderately increased left ventricular hypertrophy.  2. Left ventricular diastolic parameters are consistent with Grade I diastolic dysfunction (impaired relaxation).  3. Global right ventricle has normal systolic function.The right ventricular size is normal. No increase in right ventricular wall thickness.  4. Left atrial size was normal.  5. Right atrial size was normal.  6. The mitral valve is abnormal. Trace mitral valve regurgitation.  7. The tricuspid valve is grossly normal. Tricuspid valve regurgitation is trivial.  8. The aortic valve is tricuspid. Aortic valve regurgitation is trivial. Mild aortic valve sclerosis without stenosis.  9. The pulmonic valve was grossly normal. Pulmonic valve regurgitation is not visualized. 10. The inferior vena cava is normal in size with greater than 50% respiratory variability, suggesting right atrial pressure of 3 mmHg.   Review of Systems: [y] = yes, [ ]  = no   General: Weight gain [ ] ; Weight loss [ ] ; Anorexia [ ] ; Fatigue [ ] ; Fever [ ] ; Chills [ ] ; Weakness [ ]   Cardiac: Chest pain/pressure [ ] ; Resting SOB [ ] ; Exertional SOB [ ] ; Orthopnea [ ] ; Pedal Edema [ ] ; Palpitations [ ] ; Syncope [ ] ; Presyncope [ ] ; Paroxysmal nocturnal dyspnea[ ]   Pulmonary: Cough [ ] ; Wheezing[ ] ; Hemoptysis[ ] ; Sputum [ ] ; Snoring [ ]   GI: Vomiting[ ] ; Dysphagia[ ] ; Melena[ ] ; Hematochezia [ ] ; Heartburn[ ] ; Abdominal pain [ ] ; Constipation [ ] ; Diarrhea [ ] ; BRBPR [ ]   GU: Hematuria[ ] ; Dysuria [ ] ; Nocturia[ ]   Vascular: Pain in legs with walking [ ] ; Pain in feet with lying flat [ ] ; Non-healing sores [ ] ; Stroke [ ] ; TIA [ ] ; Slurred speech [ ] ;  Neuro: Headaches[ ] ; Vertigo[ ] ; Seizures[ ] ; Paresthesias[ ] ;Blurred vision [ ] ; Diplopia [ ] ; Vision changes [ ]   Ortho/Skin:  Arthritis [ ] ; Joint pain [ ] ; Muscle pain [ ] ; Joint swelling [ ] ; Back Pain [ ] ; Rash [ ]   Psych: Depression[ ] ; Anxiety[ ]   Heme: Bleeding problems [ ] ; Clotting disorders [ ] ; Anemia [ ]   Endocrine: Diabetes [ ] ; Thyroid dysfunction[ ]    Past Medical History:  Diagnosis Date   Arthritis    Rheumatoid arthritis,    Blood transfusion    1981   Chronic diastolic CHF (congestive heart failure) (HCC)    diastolic    Fibromyalgia    Heart murmur    as a child   Hiatal hernia    sjorgens syndrome   OSA (obstructive sleep apnea)    Peripheral autonomic neuropathy of unknown cause    Pneumonia 07/2018   Urticaria     Current Outpatient Medications  Medication Sig Dispense Refill   aspirin EC 81 MG tablet Take 81 mg by mouth at bedtime.     atorvastatin (LIPITOR) 20 MG tablet TAKE 1 TABLET BY MOUTH DAILY. 90 tablet 1   carbamazepine (TEGRETOL) 200 MG tablet Take 1 tablet (200 mg total) by mouth at bedtime. 90 tablet 3   cycloSPORINE (RESTASIS) 0.05 % ophthalmic emulsion Place 2 drops into both eyes 2 (two) times daily.      diphenoxylate-atropine (LOMOTIL) 2.5-0.025 MG per tablet Take 1 tablet by mouth 4 (four) times daily as needed for diarrhea or loose stools.     fluticasone (FLONASE) 50 MCG/ACT nasal spray Use 1 spray per nostril 1-2 times daily as needed 16 g 5   gabapentin (NEURONTIN) 300 MG capsule TAKE 2 CAPSULES IN THE MORNING AND 3 CAPSULES AT BEDTIME 450 capsule 3   hydroxychloroquine (PLAQUENIL) 200 MG tablet TAKE 1 TABLET BY MOUTH TWICE A DAY MONDAY THROUGH FRIDAY. 120 tablet 1   hyoscyamine (ANASPAZ) 0.125 MG TBDP disintergrating tablet Place 0.25 mg under the tongue.     levocetirizine (XYZAL) 5 MG tablet TAKE 1 TABLET BY MOUTH EVERY EVENING. 30 tablet 5   lidocaine (LIDODERM) 5 % PLACE 1 PATCH ONTO THE SKIN DAILY AS NEEDED (FOR PAIN). REMOVE & DISCARD PATCH WITHIN 12 HOURS OR AS DIRECTED BY MD 90 patch 0   metoprolol succinate (TOPROL-XL) 25 MG 24  hr tablet TAKE 1 TABLET BY MOUTH DAILY. 90 tablet 3   NONFORMULARY OR COMPOUNDED ITEM Triamcinolone 0.1% & Silvadene cream 1:1- Apply as directed to affected areas     promethazine (PHENERGAN) 25 MG tablet TAKE 1 TABLET BY MOUTH EVERY 6 HOURS AS NEEDED. 120 tablet 2   spironolactone (ALDACTONE) 25 MG tablet Take 1 tablet (25 mg total) by mouth 2 (two) times daily. 180 tablet 1   tiZANidine (ZANAFLEX) 4 MG tablet TAKE 1 TABLET BY MOUTH AT BEDTIME. 90 tablet 0   UNABLE TO FIND CPAP: AT bedtime; setting is "12"     Vitamin D, Ergocalciferol, (DRISDOL) 1.25 MG (50000 UT) CAPS capsule TAKE 1 CAPSULE BY MOUTH EVERY 7 DAYS. 12 capsule 10   clindamycin (  CLEOCIN) 300 MG capsule      HYDROcodone-acetaminophen (NORCO/VICODIN) 5-325 MG tablet TAKE 1 TABLET BY MOUTH AT BEDTIME AS NEEDED. 30 tablet 0   ibuprofen (ADVIL) 800 MG tablet      leflunomide (ARAVA) 20 MG tablet TAKE 1 TABLET BY MOUTH DAILY. 90 tablet 0   Multiple Vitamin (MULTIVITAMIN WITH MINERALS) TABS tablet Take 1 tablet by mouth daily.     predniSONE (DELTASONE) 1 MG tablet      torsemide (DEMADEX) 20 MG tablet Take 2 tablets (40 mg total) by mouth daily. 60 tablet 3   No current facility-administered medications for this encounter.     Allergies  Allergen Reactions   Sulfa Antibiotics Hives and Itching    Flushing also   Cymbalta [Duloxetine Hcl] Other (See Comments)    Caused a manic reaction   Demerol Hives and Other (See Comments)    Fever also   Ivp Dye [Iodinated Diagnostic Agents] Hives and Other (See Comments)    Fever also   Morphine And Related Other (See Comments)    ineffective   Sulfasalazine Other (See Comments)    Reaction ??   Adhesive [Tape] Rash      Social History   Socioeconomic History   Marital status: Widowed    Spouse name: Not on file   Number of children: 2   Years of education: AS   Highest education level: Not on file  Occupational History   Occupation: disability    Occupation: Designer, multimedia strain: Not on file   Food insecurity    Worry: Not on file    Inability: Not on file   Transportation needs    Medical: Not on file    Non-medical: Not on file  Tobacco Use   Smoking status: Never Smoker   Smokeless tobacco: Never Used  Substance and Sexual Activity   Alcohol use: Yes    Comment: socially    Drug use: No   Sexual activity: Never  Lifestyle   Physical activity    Days per week: Not on file    Minutes per session: Not on file   Stress: Not on file  Relationships   Social connections    Talks on phone: Not on file    Gets together: Not on file    Attends religious service: Not on file    Active member of club or organization: Not on file    Attends meetings of clubs or organizations: Not on file    Relationship status: Not on file   Intimate partner violence    Fear of current or ex partner: Not on file    Emotionally abused: Not on file    Physically abused: Not on file    Forced sexual activity: Not on file  Other Topics Concern   Not on file  Social History Narrative   Lives at home alone   Right-handed   Drinks 1 or less cups of coffee and 2 servings of either tea or soda per day      Family History  Problem Relation Age of Onset   Alzheimer's disease Mother    Heart attack Mother    Hypertension Mother    Glaucoma Mother    Cancer Father    Heart attack Brother    Heart disease Brother    Glaucoma Brother    Hyperlipidemia Brother    Glaucoma Brother    Asthma Son    Allergic rhinitis Neg Hx  Angioedema Neg Hx    Eczema Neg Hx    Urticaria Neg Hx     Vitals:   07/07/19 0904  BP: 123/80  Pulse: 66  SpO2: 96%  Weight: 111.3 kg (245 lb 6.4 oz)    PHYSICAL EXAM: PHYSICAL EXAM: General:  Morbidly obese WF, Well appearing. No respiratory difficulty HEENT: normal Neck: supple. no JVD. Carotids 2+ bilat; no bruits. No lymphadenopathy or thyromegaly  appreciated. Cor: PMI nondisplaced. Regular rate & rhythm. No rubs, gallops or murmurs. Lungs: clear Abdomen: obese, soft, nontender, nondistended. No hepatosplenomegaly. No bruits or masses. Good bowel sounds. Extremities: no cyanosis, clubbing, rash, 1+ bilateral LE, R>L edema Neuro: alert & oriented x 3, cranial nerves grossly intact. moves all 4 extremities w/o difficulty. Affect pleasant.   ECG: Not performed.   ASSESSMENT & PLAN:  1. Chronic Diastolic HF: Recent 2D echo 11/10 showed normal LVEF, moderate LVH, G1DD, normal RV and normal R/LA size. No significant valvular disease. No estimated RVSP on report.  - NYHA Class III symptoms. Endorses recent 20 lb wt gain. Obesity makes volume assessment difficult but she does have 1+ bilateral LEE on exam, R>L - Stop Lasix and switch to Torsemide 40 mg daily. Check BMP today and again in 7 days. Will add supp K if needed. May need further titration of torsemide +/- addition of metolazone to help w/ diuresis.  -if volume status fails to improve despite diuretic dose adjustments, may benefit from [place] in the near future to better assess cardiac/pulmonary pressures.  -given polyneuropathy, will also need to consider w/u to r/o TTR amyloid.  Will consider ordering PYP scan at next visit. Will discuss w/ Dr. Haroldine Laws.   Return visit in 2 weeks to reassess response to therapy.    Lyda Jester, PA-C 07/07/19

## 2019-07-07 NOTE — Telephone Encounter (Signed)
ok.  If her Covid test comes positive then she should hold [place].

## 2019-07-07 NOTE — Telephone Encounter (Signed)
Last Visit: 02/10/2019 Next Visit: 07/14/2019 Labs: 07/07/2019 elevated glucose, total protein 6.3, platelets 148.  UDS: 02/10/2019 narc agreement: 07/16/2018   Advised patient she is due to update UDS and patient states she came to our office last week to update but has recently been tested for COVID. Patient states she will try to go to [place] next week to update UDS. Patient will call us prior so order can be released.   Okay to refill arava and hydrocodone?

## 2019-07-07 NOTE — Patient Instructions (Signed)
STOP Lasix  START Torsemide 40mg  daily  Routine lab work today. Will notify you of abnormal results  Repeat labs in 1 week.  Follow up in 2 weeks.

## 2019-07-14 ENCOUNTER — Other Ambulatory Visit (HOSPITAL_COMMUNITY): Payer: PPO

## 2019-07-14 ENCOUNTER — Telehealth (INDEPENDENT_AMBULATORY_CARE_PROVIDER_SITE_OTHER): Payer: PPO | Admitting: Rheumatology

## 2019-07-14 ENCOUNTER — Encounter: Payer: Self-pay | Admitting: Rheumatology

## 2019-07-14 ENCOUNTER — Other Ambulatory Visit: Payer: Self-pay

## 2019-07-14 DIAGNOSIS — M0579 Rheumatoid arthritis with rheumatoid factor of multiple sites without organ or systems involvement: Secondary | ICD-10-CM

## 2019-07-14 DIAGNOSIS — M3501 Sicca syndrome with keratoconjunctivitis: Secondary | ICD-10-CM

## 2019-07-14 DIAGNOSIS — Z96651 Presence of right artificial knee joint: Secondary | ICD-10-CM | POA: Diagnosis not present

## 2019-07-14 DIAGNOSIS — M797 Fibromyalgia: Secondary | ICD-10-CM

## 2019-07-14 DIAGNOSIS — Z8639 Personal history of other endocrine, nutritional and metabolic disease: Secondary | ICD-10-CM | POA: Diagnosis not present

## 2019-07-14 DIAGNOSIS — M810 Age-related osteoporosis without current pathological fracture: Secondary | ICD-10-CM | POA: Diagnosis not present

## 2019-07-14 DIAGNOSIS — Z79899 Other long term (current) drug therapy: Secondary | ICD-10-CM

## 2019-07-14 DIAGNOSIS — M1712 Unilateral primary osteoarthritis, left knee: Secondary | ICD-10-CM | POA: Diagnosis not present

## 2019-07-14 DIAGNOSIS — I7300 Raynaud's syndrome without gangrene: Secondary | ICD-10-CM | POA: Diagnosis not present

## 2019-07-14 DIAGNOSIS — Z8719 Personal history of other diseases of the digestive system: Secondary | ICD-10-CM

## 2019-07-14 DIAGNOSIS — Z8659 Personal history of other mental and behavioral disorders: Secondary | ICD-10-CM

## 2019-07-14 DIAGNOSIS — M51369 Other intervertebral disc degeneration, lumbar region without mention of lumbar back pain or lower extremity pain: Secondary | ICD-10-CM

## 2019-07-14 DIAGNOSIS — Z8669 Personal history of other diseases of the nervous system and sense organs: Secondary | ICD-10-CM

## 2019-07-14 DIAGNOSIS — M5136 Other intervertebral disc degeneration, lumbar region: Secondary | ICD-10-CM

## 2019-07-14 DIAGNOSIS — K200 Eosinophilic esophagitis: Secondary | ICD-10-CM

## 2019-07-14 NOTE — Progress Notes (Signed)
I do not see anything in here that states why this medical record was sent to me.    Can you maybe call the patient and see why Dr. Patrecia Pour would send this to me in a "patient call" message?  When she told to follow-up with [place] for some reason?  Or what

## 2019-07-14 NOTE — Progress Notes (Signed)
Please ask patient Dr. Jefferson Fuel question. AS, CMA

## 2019-07-14 NOTE — Progress Notes (Signed)
Left message for patient to call office. AS, CMA

## 2019-07-15 ENCOUNTER — Other Ambulatory Visit: Payer: Self-pay

## 2019-07-15 ENCOUNTER — Ambulatory Visit (HOSPITAL_COMMUNITY)
Admission: RE | Admit: 2019-07-15 | Discharge: 2019-07-15 | Disposition: A | Discharge status: Home / Self Care | Payer: PPO | Source: Ambulatory Visit | Attending: Cardiology | Admitting: Cardiology

## 2019-07-15 DIAGNOSIS — I5032 Chronic diastolic (congestive) heart failure: Secondary | ICD-10-CM | POA: Diagnosis not present

## 2019-07-15 DIAGNOSIS — Z79899 Other long term (current) drug therapy: Secondary | ICD-10-CM | POA: Diagnosis not present

## 2019-07-15 LAB — BASIC METABOLIC PANEL
Anion gap: 15 (ref 5–15)
BUN: 13 mg/dL (ref 8–23)
CO2: 24 mmol/L (ref 22–32)
Calcium: 9.1 mg/dL (ref 8.9–10.3)
Chloride: 101 mmol/L (ref 98–111)
Creatinine, Ser: 0.8 mg/dL (ref 0.44–1.00)
GFR calc Af Amer: >60 mL/min (ref >60)
GFR calc non Af Amer: >60 mL/min (ref >60)
Glucose, Bld: 115 mg/dL — ABNORMAL HIGH (ref 70–99)
Potassium: 3.8 mmol/L (ref 3.5–5.1)
Sodium: 140 mmol/L (ref 135–145)

## 2019-07-15 LAB — CBC WITH DIFFERENTIAL/PLATELET
Abs Immature Granulocytes: 0.03 10*3/uL (ref 0.00–0.07)
Basophils Absolute: 0.1 10*3/uL (ref 0.0–0.1)
Basophils Relative: 1 %
Eosinophils Absolute: 0.1 10*3/uL (ref 0.0–0.5)
Eosinophils Relative: 3 %
HCT: 44.7 % (ref 36.0–46.0)
Hemoglobin: 14.4 g/dL (ref 12.0–15.0)
Immature Granulocytes: 1 %
Lymphocytes Relative: 27 %
Lymphs Abs: 1.4 10*3/uL (ref 0.7–4.0)
MCH: 28.6 pg (ref 26.0–34.0)
MCHC: 32.2 g/dL (ref 30.0–36.0)
MCV: 88.9 fL (ref 80.0–100.0)
Monocytes Absolute: 0.6 10*3/uL (ref 0.1–1.0)
Monocytes Relative: 11 %
Neutro Abs: 3 10*3/uL (ref 1.7–7.7)
Neutrophils Relative %: 57 %
Platelets: 180 10*3/uL (ref 150–400)
RBC: 5.03 MIL/uL (ref 3.87–5.11)
RDW: 13.2 % (ref 11.5–15.5)
WBC: 5.2 10*3/uL (ref 4.0–10.5)
nRBC: 0 % (ref 0.0–0.2)

## 2019-07-15 NOTE — Progress Notes (Signed)
CBC WNL

## 2019-07-17 ENCOUNTER — Encounter (HOSPITAL_COMMUNITY): Payer: Self-pay | Admitting: *Deleted

## 2019-07-21 ENCOUNTER — Ambulatory Visit: Payer: PPO

## 2019-07-21 ENCOUNTER — Ambulatory Visit (HOSPITAL_COMMUNITY)
Admission: RE | Admit: 2019-07-21 | Discharge: 2019-07-21 | Disposition: A | Discharge status: Home / Self Care | Payer: PPO | Source: Ambulatory Visit | Attending: Cardiology | Admitting: Cardiology

## 2019-07-21 ENCOUNTER — Encounter (HOSPITAL_COMMUNITY): Payer: Self-pay

## 2019-07-21 ENCOUNTER — Other Ambulatory Visit: Payer: Self-pay

## 2019-07-21 VITALS — BP 128/86 | HR 71 | Wt 241.8 lb

## 2019-07-21 DIAGNOSIS — Z8249 Family history of ischemic heart disease and other diseases of the circulatory system: Secondary | ICD-10-CM | POA: Diagnosis not present

## 2019-07-21 DIAGNOSIS — G4733 Obstructive sleep apnea (adult) (pediatric): Secondary | ICD-10-CM | POA: Diagnosis not present

## 2019-07-21 DIAGNOSIS — I10 Essential (primary) hypertension: Secondary | ICD-10-CM

## 2019-07-21 DIAGNOSIS — R0602 Shortness of breath: Secondary | ICD-10-CM | POA: Diagnosis not present

## 2019-07-21 DIAGNOSIS — Z79899 Other long term (current) drug therapy: Secondary | ICD-10-CM | POA: Diagnosis not present

## 2019-07-21 DIAGNOSIS — I110 Hypertensive heart disease with heart failure: Secondary | ICD-10-CM | POA: Diagnosis not present

## 2019-07-21 DIAGNOSIS — M797 Fibromyalgia: Secondary | ICD-10-CM | POA: Diagnosis not present

## 2019-07-21 DIAGNOSIS — M069 Rheumatoid arthritis, unspecified: Secondary | ICD-10-CM | POA: Insufficient documentation

## 2019-07-21 DIAGNOSIS — I2510 Atherosclerotic heart disease of native coronary artery without angina pectoris: Secondary | ICD-10-CM | POA: Diagnosis not present

## 2019-07-21 DIAGNOSIS — R0600 Dyspnea, unspecified: Secondary | ICD-10-CM | POA: Diagnosis not present

## 2019-07-21 DIAGNOSIS — Z7982 Long term (current) use of aspirin: Secondary | ICD-10-CM | POA: Diagnosis not present

## 2019-07-21 DIAGNOSIS — R0609 Other forms of dyspnea: Secondary | ICD-10-CM

## 2019-07-21 DIAGNOSIS — I5032 Chronic diastolic (congestive) heart failure: Secondary | ICD-10-CM | POA: Diagnosis not present

## 2019-07-21 LAB — CBC
HCT: 43.8 % (ref 36.0–46.0)
Hemoglobin: 14.5 g/dL (ref 12.0–15.0)
MCH: 28.8 pg (ref 26.0–34.0)
MCHC: 33.1 g/dL (ref 30.0–36.0)
MCV: 86.9 fL (ref 80.0–100.0)
Platelets: 163 10*3/uL (ref 150–400)
RBC: 5.04 MIL/uL (ref 3.87–5.11)
RDW: 13 % (ref 11.5–15.5)
WBC: 4.7 10*3/uL (ref 4.0–10.5)
nRBC: 0 % (ref 0.0–0.2)

## 2019-07-21 LAB — BASIC METABOLIC PANEL
Anion gap: 12 (ref 5–15)
BUN: 16 mg/dL (ref 8–23)
CO2: 27 mmol/L (ref 22–32)
Calcium: 8.7 mg/dL — ABNORMAL LOW (ref 8.9–10.3)
Chloride: 100 mmol/L (ref 98–111)
Creatinine, Ser: 0.86 mg/dL (ref 0.44–1.00)
GFR calc Af Amer: >60 mL/min (ref >60)
GFR calc non Af Amer: >60 mL/min (ref >60)
Glucose, Bld: 127 mg/dL — ABNORMAL HIGH (ref 70–99)
Potassium: 3.3 mmol/L — ABNORMAL LOW (ref 3.5–5.1)
Sodium: 139 mmol/L (ref 135–145)

## 2019-07-21 LAB — PROTIME-INR
INR: 1 (ref 0.8–1.2)
Prothrombin Time: 13.2 seconds (ref 11.4–15.2)

## 2019-07-21 MED ORDER — PREDNISONE 10 MG PO TABS: 10.0000 mg | ORAL_TABLET | ORAL | 0 refills | Status: DC

## 2019-07-21 MED ORDER — DIPHENHYDRAMINE HCL 50 MG PO TABS: 50.0000 mg | ORAL_TABLET | ORAL | 0 refills | Status: DC

## 2019-07-21 NOTE — Progress Notes (Signed)
Advanced Heart Failure Clinic Note   Referring Physician: PCP: Mellody Dance, DO PCP-Cardiologist: Fransico Him, MD  AHFC: Dr. Haroldine Laws   HPI:  [name] is a 68 year old with a history of PVCs, morbid obesity, OSA on CPAP, RA, and chronic diastolic heart failure. Previously followed by Dr. Radford Pax but was referred to the [facility] in 2019 for management of diastolic HF given difficulties managing volume despite diuretics.   Had initial consult w/ Dr. Haroldine Laws. Says she started having problems with volume overload in July 2018. Has tried multiple diuretics but says nothing has worked very well except lasix and metolazone but that bottoms out her potassium. Last seen on 08/1717. At that time torsemide was increased to 60 mg twice a day due to volume overload. Weight at that time was 235 pounds. Per initial consult, there was low suspicion for TTR amyloid at the time.   Was seen in ER on 2/13 with CP. Troponin 0.0. CT chest no PE. Minimal coronary calcium. Diagnosed with pleurisy.  Seen in clinic 10/11/2017 and her volume status was felt to be stable. At that time, her diuretic regimen included torsemide 60 mg bid + spiro 25 mg bid and 2.5 mg of metolazone PRN. She was lost to f/u for a period of time but had seen general cardiology. She returned to AHF clinic 07/07/19 w/ complaints of progressive exertional and resting dyspnea over the last 2 months. Also noted ~20 lb weight gain over that time frame. Reported compliance w/ diuretics. However her diuretic regimen was completely changed and it was not well outlined when and why regimen was altered. Pt was also unsure herself and was on a very low dose of Lasix, 20 mg daily. Was still on spiro 25 mg bid. Abdomen was obese on examination and she had 1+ bilateral LEE on exam, R>L. She has never had a RHC. She denied CP. She has history of polyneuropathy. Recent 2D echo 11/10 showed normal LVEF, moderate LVH, G1DD, normal RV and normal R/LA size. No  significant valvular disease. No estimated RVSP on report. Her diuretics were changed. Lasix was discontinued she she was placed back on Torsemide 40 mg daily.   She returned last week for repeat BMP. SCr and K stable w/ torsemide. She presents to clinic today for f/u. Slightly improved but still SOB w/ exertion. Denies resting dyspnea. Wt down 4 lb from last OV. Denies CP but evidence of coronary calcification on chest CT in 2019 and aortic valve calcification on recent echo. No AS. Reports full nightly compliance w/ CPAP.     ECHO 06/24/2019  1. Left ventricular ejection fraction, by visual estimation, is 60 to 65%. The left ventricle has normal function. There is moderately increased left ventricular hypertrophy.  2. Left ventricular diastolic parameters are consistent with Grade I diastolic dysfunction (impaired relaxation).  3. Global right ventricle has normal systolic function.The right ventricular size is normal. No increase in right ventricular wall thickness.  4. Left atrial size was normal.  5. Right atrial size was normal.  6. The mitral valve is abnormal. Trace mitral valve regurgitation.  7. The tricuspid valve is grossly normal. Tricuspid valve regurgitation is trivial.  8. The aortic valve is tricuspid. Aortic valve regurgitation is trivial. Mild aortic valve sclerosis without stenosis.  9. The pulmonic valve was grossly normal. Pulmonic valve regurgitation is not visualized. 10. The inferior vena cava is normal in size with greater than 50% respiratory variability, suggesting right atrial pressure of 3 mmHg.  Review of Systems: [y] = yes, [ ]  = no   General: Weight gain [ ] ; Weight loss [ ] ; Anorexia [ ] ; Fatigue [ ] ; Fever [ ] ; Chills [ ] ; Weakness [ ]   Cardiac: Chest pain/pressure [ ] ; Resting SOB [ ] ; Exertional SOB [ ] ; Orthopnea [ ] ; Pedal Edema [ ] ; Palpitations [ ] ; Syncope [ ] ; Presyncope [ ] ; Paroxysmal nocturnal dyspnea[ ]   Pulmonary: Cough [ ] ; Wheezing[ ] ;  Hemoptysis[ ] ; Sputum [ ] ; Snoring [ ]   GI: Vomiting[ ] ; Dysphagia[ ] ; Melena[ ] ; Hematochezia [ ] ; Heartburn[ ] ; Abdominal pain [ ] ; Constipation [ ] ; Diarrhea [ ] ; BRBPR [ ]   GU: Hematuria[ ] ; Dysuria [ ] ; Nocturia[ ]   Vascular: Pain in legs with walking [ ] ; Pain in feet with lying flat [ ] ; Non-healing sores [ ] ; Stroke [ ] ; TIA [ ] ; Slurred speech [ ] ;  Neuro: Headaches[ ] ; Vertigo[ ] ; Seizures[ ] ; Paresthesias[ ] ;Blurred vision [ ] ; Diplopia [ ] ; Vision changes [ ]   Ortho/Skin: Arthritis [ ] ; Joint pain [ ] ; Muscle pain [ ] ; Joint swelling [ ] ; Back Pain [ ] ; Rash [ ]   Psych: Depression[ ] ; Anxiety[ ]   Heme: Bleeding problems [ ] ; Clotting disorders [ ] ; Anemia [ ]   Endocrine: Diabetes [ ] ; Thyroid dysfunction[ ]    Past Medical History:  Diagnosis Date   Arthritis    Rheumatoid arthritis,    Blood transfusion    1981   Chronic diastolic CHF (congestive heart failure) (HCC)    diastolic    Fibromyalgia    Heart murmur    as a child   Hiatal hernia    sjorgens syndrome   OSA (obstructive sleep apnea)    Peripheral autonomic neuropathy of unknown cause    Pneumonia 07/2018   Urticaria     Current Outpatient Medications  Medication Sig Dispense Refill   aspirin EC 81 MG tablet Take 81 mg by mouth at bedtime.     atorvastatin (LIPITOR) 20 MG tablet TAKE 1 TABLET BY MOUTH DAILY. 90 tablet 1   carbamazepine (TEGRETOL) 200 MG tablet Take 1 tablet (200 mg total) by mouth at bedtime. 90 tablet 3   cycloSPORINE (RESTASIS) 0.05 % ophthalmic emulsion Place 2 drops into both eyes 2 (two) times daily.      diphenoxylate-atropine (LOMOTIL) 2.5-0.025 MG per tablet Take 1 tablet by mouth 4 (four) times daily as needed for diarrhea or loose stools.     fluticasone (FLONASE) 50 MCG/ACT nasal spray Use 1 spray per nostril 1-2 times daily as needed 16 g 5   gabapentin (NEURONTIN) 300 MG capsule TAKE 2 CAPSULES IN THE MORNING AND 3 CAPSULES AT BEDTIME 450 capsule 3    HYDROcodone-acetaminophen (NORCO/VICODIN) 5-325 MG tablet TAKE 1 TABLET BY MOUTH AT BEDTIME AS NEEDED. 30 tablet 0   hydroxychloroquine (PLAQUENIL) 200 MG tablet TAKE 1 TABLET BY MOUTH TWICE A DAY MONDAY THROUGH FRIDAY. 120 tablet 1   hyoscyamine (ANASPAZ) 0.125 MG TBDP disintergrating tablet Place 0.25 mg under the tongue.     leflunomide (ARAVA) 20 MG tablet TAKE 1 TABLET BY MOUTH DAILY. 90 tablet 0   levocetirizine (XYZAL) 5 MG tablet TAKE 1 TABLET BY MOUTH EVERY EVENING. 30 tablet 5   lidocaine (LIDODERM) 5 % PLACE 1 PATCH ONTO THE SKIN DAILY AS NEEDED (FOR PAIN). REMOVE & DISCARD PATCH WITHIN 12 HOURS OR AS DIRECTED BY MD 90 patch 0   metoprolol succinate (TOPROL-XL) 25 MG 24 hr tablet TAKE 1 TABLET BY MOUTH DAILY.  90 tablet 3   NONFORMULARY OR COMPOUNDED ITEM Triamcinolone 0.1% & Silvadene cream 1:1- Apply as directed to affected areas     promethazine (PHENERGAN) 25 MG tablet TAKE 1 TABLET BY MOUTH EVERY 6 HOURS AS NEEDED. 120 tablet 2   spironolactone (ALDACTONE) 25 MG tablet Take 1 tablet (25 mg total) by mouth 2 (two) times daily. 180 tablet 1   tiZANidine (ZANAFLEX) 4 MG tablet TAKE 1 TABLET BY MOUTH AT BEDTIME. 90 tablet 0   torsemide (DEMADEX) 20 MG tablet Take 2 tablets (40 mg total) by mouth daily. 60 tablet 3   UNABLE TO FIND CPAP: AT bedtime; setting is "12"     Vitamin D, Ergocalciferol, (DRISDOL) 1.25 MG (50000 UT) CAPS capsule TAKE 1 CAPSULE BY MOUTH EVERY 7 DAYS. 12 capsule 10   clindamycin (CLEOCIN) 300 MG capsule      ibuprofen (ADVIL) 800 MG tablet      Multiple Vitamin (MULTIVITAMIN WITH MINERALS) TABS tablet Take 1 tablet by mouth daily.     predniSONE (DELTASONE) 1 MG tablet      No current facility-administered medications for this encounter.     Allergies  Allergen Reactions   Sulfa Antibiotics Hives and Itching    Flushing also   Cymbalta [Duloxetine Hcl] Other (See Comments)    Caused a manic reaction   Demerol Hives and Other (See  Comments)    Fever also   Ivp Dye [Iodinated Diagnostic Agents] Hives and Other (See Comments)    Fever also   Morphine And Related Other (See Comments)    ineffective   Sulfasalazine Other (See Comments)    Reaction ??   Adhesive [Tape] Rash      Social History   Socioeconomic History   Marital status: Widowed    Spouse name: Not on file   Number of children: 2   Years of education: AS   Highest education level: Not on file  Occupational History   Occupation: disability   Occupation: Designer, multimedia strain: Not on file   Food insecurity    Worry: Not on file    Inability: Not on file   Transportation needs    Medical: Not on file    Non-medical: Not on file  Tobacco Use   Smoking status: Never Smoker   Smokeless tobacco: Never Used  Substance and Sexual Activity   Alcohol use: Yes    Comment: socially    Drug use: No   Sexual activity: Never  Lifestyle   Physical activity    Days per week: Not on file    Minutes per session: Not on file   Stress: Not on file  Relationships   Social connections    Talks on phone: Not on file    Gets together: Not on file    Attends religious service: Not on file    Active member of club or organization: Not on file    Attends meetings of clubs or organizations: Not on file    Relationship status: Not on file   Intimate partner violence    Fear of current or ex partner: Not on file    Emotionally abused: Not on file    Physically abused: Not on file    Forced sexual activity: Not on file  Other Topics Concern   Not on file  Social History Narrative   Lives at home alone   Right-handed   Drinks 1 or less cups of coffee and 2 servings  of either tea or soda per day      Family History  Problem Relation Age of Onset   Alzheimer's disease Mother    Heart attack Mother    Hypertension Mother    Glaucoma Mother    Cancer Father    Heart attack Brother    Heart  disease Brother    Glaucoma Brother    Hyperlipidemia Brother    Glaucoma Brother    Asthma Son    Allergic rhinitis Neg Hx    Angioedema Neg Hx    Eczema Neg Hx    Urticaria Neg Hx     Vitals:   07/21/19 0944  BP: 128/86  Pulse: 71  SpO2: 95%  Weight: 109.7 kg (241 lb 12.8 oz)    PHYSICAL EXAM: PHYSICAL EXAM: General:  Obese middle aged WF No respiratory difficulty HEENT: normal Neck: supple. Mildly elevated JVD. Carotids 2+ bilat; no bruits. No lymphadenopathy or thyromegaly appreciated. Cor: PMI nondisplaced. Regular rate & rhythm. No rubs, gallops or murmurs. Lungs: clear Abdomen: obese, soft, nontender, nondistended. No hepatosplenomegaly. No bruits or masses. Good bowel sounds. Extremities: no cyanosis, clubbing, rash, 1+ bilateral LE edema, R>L Neuro: alert & oriented x 3, cranial nerves grossly intact. moves all 4 extremities w/o difficulty. Affect pleasant.   ECG: Not performed.   ASSESSMENT & PLAN:  1. Chronic Diastolic HF: Recent 2D echo 11/10 showed normal LVEF, moderate LVH, G1DD, normal RV and normal R/LA size. No significant valvular disease. No estimated RVSP on report.  - NYHA Class III symptoms w/ slight improvement after torsemide addition.  Obesity makes volume assessment difficult. Discussed case w/ Dr. Haroldine Laws, we will plan [facility] to assess cardiac/pulmonary pressures and check for underlying coronary ischemia. -I have reviewed the risks, indications, and alternatives to cardiac catheterization and possible angioplasty/stenting with the patient. Risks include but are not limited to bleeding, infection, vascular injury, stroke, myocardial infection, arrhythmia, kidney injury, radiation-related injury in the case of prolonged fluoroscopy use, emergency cardiac surgery, and death. The patient understands the risks of serious complication is low (<0%).  - she has contrast allergy. Will premedicate prior to cath. -given polyneuropathy, will also need  to consider future w/u to r/o TTR amyloid. Will determine need for w/u after cardiac cath. -continue torsemide 40 mg daily for now (instructed to hold morning of cath) -continue  blocker therapy w/ metoprolol.   2. OSA: continue CPAP. Followed by Dr. Radford Pax  3. Obesity: encouraged wt loss. Recommend [employer].   F/u:  1-2 weeks after cardiac cath.   Lyda Jester, PA-C 07/21/19   Patient seen and examined with the above-signed Advanced Practice Provider and/or Housestaff. I personally reviewed laboratory data, imaging studies and relevant notes. I independently examined the patient and formulated the important aspects of the plan. I have edited the note to reflect any of my changes or salient points. I have personally discussed the plan with the patient and/or family.  68 y/o obese woman with OSA on CPAP, chronic diastolic HF and HTN. Has persistent, progressive dyspnea on exertion not improving with HF therapy. Also occasional chest tightness.   General:  Obese woman.  No resp difficulty HEENT: normal Neck: supple. JVP hard to see. Carotids 2+ bilat; no bruits. No lymphadenopathy or thryomegaly appreciated. Cor: PMI nondisplaced. Regular rate & rhythm. No rubs, gallops or murmurs. Lungs: clear Abdomen: soft, nontender, nondistended. No hepatosplenomegaly. No bruits or masses. Good bowel sounds. Extremities: no cyanosis, clubbing, rash, edema Neuro: alert & orientedx3, cranial nerves  grossly intact. moves all 4 extremities w/o difficulty. Affect pleasant   Will proceed with R/L cath to further evaluate R/L sided pressures, CO and coronary status. We discussed the procedure and she would like to proceed. Will need premed for contrast dye allergy.   Glori Bickers, MD  8:15 PM

## 2019-07-21 NOTE — Patient Instructions (Signed)
It was great to see you today! No medication changes are needed at this time.  Your physician recommends that you schedule a follow-up appointment in: 7-14 Days with Dr Haroldine Laws    You are scheduled for a Cardiac Catheterization on Friday, December 11 with Dr. Glori Bickers.  1. Please arrive at the [facility] (Main Entrance A) at [facility]: [address] [place], [place] [address] at 5:30 AM (This time is two hours before your procedure to ensure your preparation). Free valet parking service is available.   Special note: Every effort is made to have your procedure done on time. Please understand that emergencies sometimes delay scheduled procedures.  2. Diet: Do not eat solid foods after midnight.  The patient may have clear liquids until 5am upon the day of the procedure.  3. Labs: Pre procedure labs done 07/21/19 You will need a pre procedure COVID test on 07/22/2019 any time between 9am-3pm [facility] [id number] -you must remain home in quarantine until you return for your procedure after you are tested   4. Medication instructions in preparation for your procedure:   Contrast Allergy: Yes, Please take Prednisone 50mg  by mouth at: Thirteen hours prior to cath 6:00pm on Thursday Seven hours prior to cath 1:00am on Friday And prior to leaving home please take last dose of Prednisone 50mg  and Benadryl 50mg  by mouth.      Stop taking, Torsemide (Demadex) Friday, December 11,      On the morning of your procedure, take your Aspirin and any morning medicines NOT listed above.  You may use sips of water.  5. Plan for one night stay--bring personal belongings. 6. Bring a current list of your medications and current insurance cards. 7. You MUST have a responsible person to drive you home. 8. Someone MUST be with you the first 24 hours after you arrive home or your discharge will be delayed. 9. Please wear clothes that are  easy to get on and off and wear slip-on shoes.  Thank you for allowing [place] to care for you!   -- [facility]

## 2019-07-21 NOTE — H&P (View-Only) (Signed)
Advanced Heart Failure Clinic Note   Referring Physician: PCP: Mellody Dance, DO PCP-Cardiologist: Fransico Him, MD  AHFC: Dr. Haroldine Laws   HPI:  [name] is a 68 year old with a history of PVCs, morbid obesity, OSA on CPAP, RA, and chronic diastolic heart failure. Previously followed by Dr. Radford Pax but was referred to the [facility] in 2019 for management of diastolic HF given difficulties managing volume despite diuretics.   Had initial consult w/ Dr. Haroldine Laws. Says she started having problems with volume overload in July 2018. Has tried multiple diuretics but says nothing has worked very well except lasix and metolazone but that bottoms out her potassium. Last seen on 08/1717. At that time torsemide was increased to 60 mg twice a day due to volume overload. Weight at that time was 235 pounds. Per initial consult, there was low suspicion for TTR amyloid at the time.   Was seen in ER on 2/13 with CP. Troponin 0.0. CT chest no PE. Minimal coronary calcium. Diagnosed with pleurisy.  Seen in clinic 10/11/2017 and her volume status was felt to be stable. At that time, her diuretic regimen included torsemide 60 mg bid + spiro 25 mg bid and 2.5 mg of metolazone PRN. She was lost to f/u for a period of time but had seen general cardiology. She returned to AHF clinic 07/07/19 w/ complaints of progressive exertional and resting dyspnea over the last 2 months. Also noted ~20 lb weight gain over that time frame. Reported compliance w/ diuretics. However her diuretic regimen was completely changed and it was not well outlined when and why regimen was altered. Pt was also unsure herself and was on a very low dose of Lasix, 20 mg daily. Was still on spiro 25 mg bid. Abdomen was obese on examination and she had 1+ bilateral LEE on exam, R>L. She has never had a RHC. She denied CP. She has history of polyneuropathy. Recent 2D echo 11/10 showed normal LVEF, moderate LVH, G1DD, normal RV and normal R/LA size. No  significant valvular disease. No estimated RVSP on report. Her diuretics were changed. Lasix was discontinued she she was placed back on Torsemide 40 mg daily.   She returned last week for repeat BMP. SCr and K stable w/ torsemide. She presents to clinic today for f/u. Slightly improved but still SOB w/ exertion. Denies resting dyspnea. Wt down 4 lb from last OV. Denies CP but evidence of coronary calcification on chest CT in 2019 and aortic valve calcification on recent echo. No AS. Reports full nightly compliance w/ CPAP.     ECHO 06/24/2019  1. Left ventricular ejection fraction, by visual estimation, is 60 to 65%. The left ventricle has normal function. There is moderately increased left ventricular hypertrophy.  2. Left ventricular diastolic parameters are consistent with Grade I diastolic dysfunction (impaired relaxation).  3. Global right ventricle has normal systolic function.The right ventricular size is normal. No increase in right ventricular wall thickness.  4. Left atrial size was normal.  5. Right atrial size was normal.  6. The mitral valve is abnormal. Trace mitral valve regurgitation.  7. The tricuspid valve is grossly normal. Tricuspid valve regurgitation is trivial.  8. The aortic valve is tricuspid. Aortic valve regurgitation is trivial. Mild aortic valve sclerosis without stenosis.  9. The pulmonic valve was grossly normal. Pulmonic valve regurgitation is not visualized. 10. The inferior vena cava is normal in size with greater than 50% respiratory variability, suggesting right atrial pressure of 3 mmHg.  Review of Systems: [y] = yes, [ ]  = no   General: Weight gain [ ] ; Weight loss [ ] ; Anorexia [ ] ; Fatigue [ ] ; Fever [ ] ; Chills [ ] ; Weakness [ ]   Cardiac: Chest pain/pressure [ ] ; Resting SOB [ ] ; Exertional SOB [ ] ; Orthopnea [ ] ; Pedal Edema [ ] ; Palpitations [ ] ; Syncope [ ] ; Presyncope [ ] ; Paroxysmal nocturnal dyspnea[ ]   Pulmonary: Cough [ ] ; Wheezing[ ] ;  Hemoptysis[ ] ; Sputum [ ] ; Snoring [ ]   GI: Vomiting[ ] ; Dysphagia[ ] ; Melena[ ] ; Hematochezia [ ] ; Heartburn[ ] ; Abdominal pain [ ] ; Constipation [ ] ; Diarrhea [ ] ; BRBPR [ ]   GU: Hematuria[ ] ; Dysuria [ ] ; Nocturia[ ]   Vascular: Pain in legs with walking [ ] ; Pain in feet with lying flat [ ] ; Non-healing sores [ ] ; Stroke [ ] ; TIA [ ] ; Slurred speech [ ] ;  Neuro: Headaches[ ] ; Vertigo[ ] ; Seizures[ ] ; Paresthesias[ ] ;Blurred vision [ ] ; Diplopia [ ] ; Vision changes [ ]   Ortho/Skin: Arthritis [ ] ; Joint pain [ ] ; Muscle pain [ ] ; Joint swelling [ ] ; Back Pain [ ] ; Rash [ ]   Psych: Depression[ ] ; Anxiety[ ]   Heme: Bleeding problems [ ] ; Clotting disorders [ ] ; Anemia [ ]   Endocrine: Diabetes [ ] ; Thyroid dysfunction[ ]    Past Medical History:  Diagnosis Date   Arthritis    Rheumatoid arthritis,    Blood transfusion    1981   Chronic diastolic CHF (congestive heart failure) (HCC)    diastolic    Fibromyalgia    Heart murmur    as a child   Hiatal hernia    sjorgens syndrome   OSA (obstructive sleep apnea)    Peripheral autonomic neuropathy of unknown cause    Pneumonia 07/2018   Urticaria     Current Outpatient Medications  Medication Sig Dispense Refill   aspirin EC 81 MG tablet Take 81 mg by mouth at bedtime.     atorvastatin (LIPITOR) 20 MG tablet TAKE 1 TABLET BY MOUTH DAILY. 90 tablet 1   carbamazepine (TEGRETOL) 200 MG tablet Take 1 tablet (200 mg total) by mouth at bedtime. 90 tablet 3   cycloSPORINE (RESTASIS) 0.05 % ophthalmic emulsion Place 2 drops into both eyes 2 (two) times daily.      diphenoxylate-atropine (LOMOTIL) 2.5-0.025 MG per tablet Take 1 tablet by mouth 4 (four) times daily as needed for diarrhea or loose stools.     fluticasone (FLONASE) 50 MCG/ACT nasal spray Use 1 spray per nostril 1-2 times daily as needed 16 g 5   gabapentin (NEURONTIN) 300 MG capsule TAKE 2 CAPSULES IN THE MORNING AND 3 CAPSULES AT BEDTIME 450 capsule 3    HYDROcodone-acetaminophen (NORCO/VICODIN) 5-325 MG tablet TAKE 1 TABLET BY MOUTH AT BEDTIME AS NEEDED. 30 tablet 0   hydroxychloroquine (PLAQUENIL) 200 MG tablet TAKE 1 TABLET BY MOUTH TWICE A DAY MONDAY THROUGH FRIDAY. 120 tablet 1   hyoscyamine (ANASPAZ) 0.125 MG TBDP disintergrating tablet Place 0.25 mg under the tongue.     leflunomide (ARAVA) 20 MG tablet TAKE 1 TABLET BY MOUTH DAILY. 90 tablet 0   levocetirizine (XYZAL) 5 MG tablet TAKE 1 TABLET BY MOUTH EVERY EVENING. 30 tablet 5   lidocaine (LIDODERM) 5 % PLACE 1 PATCH ONTO THE SKIN DAILY AS NEEDED (FOR PAIN). REMOVE & DISCARD PATCH WITHIN 12 HOURS OR AS DIRECTED BY MD 90 patch 0   metoprolol succinate (TOPROL-XL) 25 MG 24 hr tablet TAKE 1 TABLET BY MOUTH DAILY.  90 tablet 3   NONFORMULARY OR COMPOUNDED ITEM Triamcinolone 0.1% & Silvadene cream 1:1- Apply as directed to affected areas     promethazine (PHENERGAN) 25 MG tablet TAKE 1 TABLET BY MOUTH EVERY 6 HOURS AS NEEDED. 120 tablet 2   spironolactone (ALDACTONE) 25 MG tablet Take 1 tablet (25 mg total) by mouth 2 (two) times daily. 180 tablet 1   tiZANidine (ZANAFLEX) 4 MG tablet TAKE 1 TABLET BY MOUTH AT BEDTIME. 90 tablet 0   torsemide (DEMADEX) 20 MG tablet Take 2 tablets (40 mg total) by mouth daily. 60 tablet 3   UNABLE TO FIND CPAP: AT bedtime; setting is "12"     Vitamin D, Ergocalciferol, (DRISDOL) 1.25 MG (50000 UT) CAPS capsule TAKE 1 CAPSULE BY MOUTH EVERY 7 DAYS. 12 capsule 10   clindamycin (CLEOCIN) 300 MG capsule      ibuprofen (ADVIL) 800 MG tablet      Multiple Vitamin (MULTIVITAMIN WITH MINERALS) TABS tablet Take 1 tablet by mouth daily.     predniSONE (DELTASONE) 1 MG tablet      No current facility-administered medications for this encounter.     Allergies  Allergen Reactions   Sulfa Antibiotics Hives and Itching    Flushing also   Cymbalta [Duloxetine Hcl] Other (See Comments)    Caused a manic reaction   Demerol Hives and Other (See  Comments)    Fever also   Ivp Dye [Iodinated Diagnostic Agents] Hives and Other (See Comments)    Fever also   Morphine And Related Other (See Comments)    ineffective   Sulfasalazine Other (See Comments)    Reaction ??   Adhesive [Tape] Rash      Social History   Socioeconomic History   Marital status: Widowed    Spouse name: Not on file   Number of children: 2   Years of education: AS   Highest education level: Not on file  Occupational History   Occupation: disability   Occupation: Designer, multimedia strain: Not on file   Food insecurity    Worry: Not on file    Inability: Not on file   Transportation needs    Medical: Not on file    Non-medical: Not on file  Tobacco Use   Smoking status: Never Smoker   Smokeless tobacco: Never Used  Substance and Sexual Activity   Alcohol use: Yes    Comment: socially    Drug use: No   Sexual activity: Never  Lifestyle   Physical activity    Days per week: Not on file    Minutes per session: Not on file   Stress: Not on file  Relationships   Social connections    Talks on phone: Not on file    Gets together: Not on file    Attends religious service: Not on file    Active member of club or organization: Not on file    Attends meetings of clubs or organizations: Not on file    Relationship status: Not on file   Intimate partner violence    Fear of current or ex partner: Not on file    Emotionally abused: Not on file    Physically abused: Not on file    Forced sexual activity: Not on file  Other Topics Concern   Not on file  Social History Narrative   Lives at home alone   Right-handed   Drinks 1 or less cups of coffee and 2 servings  of either tea or soda per day      Family History  Problem Relation Age of Onset   Alzheimer's disease Mother    Heart attack Mother    Hypertension Mother    Glaucoma Mother    Cancer Father    Heart attack Brother    Heart  disease Brother    Glaucoma Brother    Hyperlipidemia Brother    Glaucoma Brother    Asthma Son    Allergic rhinitis Neg Hx    Angioedema Neg Hx    Eczema Neg Hx    Urticaria Neg Hx     Vitals:   07/21/19 0944  BP: 128/86  Pulse: 71  SpO2: 95%  Weight: 109.7 kg (241 lb 12.8 oz)    PHYSICAL EXAM: PHYSICAL EXAM: General:  Obese middle aged WF No respiratory difficulty HEENT: normal Neck: supple. Mildly elevated JVD. Carotids 2+ bilat; no bruits. No lymphadenopathy or thyromegaly appreciated. Cor: PMI nondisplaced. Regular rate & rhythm. No rubs, gallops or murmurs. Lungs: clear Abdomen: obese, soft, nontender, nondistended. No hepatosplenomegaly. No bruits or masses. Good bowel sounds. Extremities: no cyanosis, clubbing, rash, 1+ bilateral LE edema, R>L Neuro: alert & oriented x 3, cranial nerves grossly intact. moves all 4 extremities w/o difficulty. Affect pleasant.   ECG: Not performed.   ASSESSMENT & PLAN:  1. Chronic Diastolic HF: Recent 2D echo 11/10 showed normal LVEF, moderate LVH, G1DD, normal RV and normal R/LA size. No significant valvular disease. No estimated RVSP on report.  - NYHA Class III symptoms w/ slight improvement after torsemide addition.  Obesity makes volume assessment difficult. Discussed case w/ Dr. Haroldine Laws, we will plan [facility] to assess cardiac/pulmonary pressures and check for underlying coronary ischemia. -I have reviewed the risks, indications, and alternatives to cardiac catheterization and possible angioplasty/stenting with the patient. Risks include but are not limited to bleeding, infection, vascular injury, stroke, myocardial infection, arrhythmia, kidney injury, radiation-related injury in the case of prolonged fluoroscopy use, emergency cardiac surgery, and death. The patient understands the risks of serious complication is low (<0%).  - she has contrast allergy. Will premedicate prior to cath. -given polyneuropathy, will also need  to consider future w/u to r/o TTR amyloid. Will determine need for w/u after cardiac cath. -continue torsemide 40 mg daily for now (instructed to hold morning of cath) -continue  blocker therapy w/ metoprolol.   2. OSA: continue CPAP. Followed by Dr. Radford Pax  3. Obesity: encouraged wt loss. Recommend [employer].   F/u:  1-2 weeks after cardiac cath.   Lyda Jester, PA-C 07/21/19   Patient seen and examined with the above-signed Advanced Practice Provider and/or Housestaff. I personally reviewed laboratory data, imaging studies and relevant notes. I independently examined the patient and formulated the important aspects of the plan. I have edited the note to reflect any of my changes or salient points. I have personally discussed the plan with the patient and/or family.  68 y/o obese woman with OSA on CPAP, chronic diastolic HF and HTN. Has persistent, progressive dyspnea on exertion not improving with HF therapy. Also occasional chest tightness.   General:  Obese woman.  No resp difficulty HEENT: normal Neck: supple. JVP hard to see. Carotids 2+ bilat; no bruits. No lymphadenopathy or thryomegaly appreciated. Cor: PMI nondisplaced. Regular rate & rhythm. No rubs, gallops or murmurs. Lungs: clear Abdomen: soft, nontender, nondistended. No hepatosplenomegaly. No bruits or masses. Good bowel sounds. Extremities: no cyanosis, clubbing, rash, edema Neuro: alert & orientedx3, cranial nerves  grossly intact. moves all 4 extremities w/o difficulty. Affect pleasant   Will proceed with R/L cath to further evaluate R/L sided pressures, CO and coronary status. We discussed the procedure and she would like to proceed. Will need premed for contrast dye allergy.   Glori Bickers, MD  8:15 PM

## 2019-07-22 ENCOUNTER — Telehealth (HOSPITAL_COMMUNITY): Payer: Self-pay

## 2019-07-22 ENCOUNTER — Encounter (HOSPITAL_COMMUNITY): Payer: Self-pay

## 2019-07-22 ENCOUNTER — Other Ambulatory Visit (HOSPITAL_COMMUNITY): Payer: Self-pay | Admitting: Cardiology

## 2019-07-22 ENCOUNTER — Other Ambulatory Visit (HOSPITAL_COMMUNITY)
Admission: RE | Admit: 2019-07-22 | Discharge: 2019-07-22 | Disposition: A | Discharge status: Home / Self Care | Payer: PPO | Source: Ambulatory Visit | Attending: Internal Medicine | Admitting: Internal Medicine

## 2019-07-22 DIAGNOSIS — I5032 Chronic diastolic (congestive) heart failure: Secondary | ICD-10-CM

## 2019-07-22 DIAGNOSIS — Z01812 Encounter for preprocedural laboratory examination: Secondary | ICD-10-CM | POA: Insufficient documentation

## 2019-07-22 DIAGNOSIS — Z20828 Contact with and (suspected) exposure to other viral communicable diseases: Secondary | ICD-10-CM | POA: Diagnosis not present

## 2019-07-22 MED ORDER — POTASSIUM CHLORIDE ER 10 MEQ PO TBCR: 20.0000 meq | EXTENDED_RELEASE_TABLET | Freq: Every day | ORAL | 3 refills | Status: DC

## 2019-07-22 NOTE — Telephone Encounter (Signed)
Pt called asking questions about instructions for taking pre meds for cath, reviewed with patient.  Also sent to patient via mychart. Pt states she lost the instructions from visit

## 2019-07-22 NOTE — Telephone Encounter (Signed)
Pt called to report that she received wrong dose for prednisone.  New order with 50mg  x3  tabs sent to pharmacy. Per MD, Pt start potassium 20 meq daily will take 60 meq today. Pt aware of all. Will repeat bmet in 1 week. Verbalized understanding.

## 2019-07-23 ENCOUNTER — Other Ambulatory Visit (HOSPITAL_COMMUNITY): Payer: PPO

## 2019-07-23 LAB — NOVEL CORONAVIRUS, NAA (HOSP ORDER, SEND-OUT TO REF LAB; TAT 18-24 HRS): SARS-CoV-2, NAA: NOT DETECTED

## 2019-07-25 ENCOUNTER — Ambulatory Visit (HOSPITAL_COMMUNITY)
Admission: RE | Admit: 2019-07-25 | Discharge: 2019-07-25 | Disposition: A | Discharge status: Home / Self Care | Payer: PPO | Attending: Internal Medicine | Admitting: Internal Medicine

## 2019-07-25 ENCOUNTER — Other Ambulatory Visit: Payer: Self-pay

## 2019-07-25 ENCOUNTER — Encounter (HOSPITAL_COMMUNITY)
Admission: RE | Disposition: A | Discharge status: Home / Self Care | Payer: Self-pay | Source: Home / Self Care | Attending: Internal Medicine

## 2019-07-25 ENCOUNTER — Encounter: Payer: Self-pay | Admitting: *Deleted

## 2019-07-25 DIAGNOSIS — G4733 Obstructive sleep apnea (adult) (pediatric): Secondary | ICD-10-CM | POA: Diagnosis not present

## 2019-07-25 DIAGNOSIS — R0609 Other forms of dyspnea: Secondary | ICD-10-CM | POA: Diagnosis not present

## 2019-07-25 DIAGNOSIS — I110 Hypertensive heart disease with heart failure: Secondary | ICD-10-CM | POA: Insufficient documentation

## 2019-07-25 DIAGNOSIS — Z79899 Other long term (current) drug therapy: Secondary | ICD-10-CM | POA: Insufficient documentation

## 2019-07-25 DIAGNOSIS — M069 Rheumatoid arthritis, unspecified: Secondary | ICD-10-CM | POA: Insufficient documentation

## 2019-07-25 DIAGNOSIS — Z6841 Body Mass Index (BMI) 40.0 and over, adult: Secondary | ICD-10-CM | POA: Insufficient documentation

## 2019-07-25 DIAGNOSIS — R0600 Dyspnea, unspecified: Secondary | ICD-10-CM | POA: Diagnosis not present

## 2019-07-25 DIAGNOSIS — Z888 Allergy status to other drugs, medicaments and biological substances status: Secondary | ICD-10-CM | POA: Diagnosis not present

## 2019-07-25 DIAGNOSIS — R0789 Other chest pain: Secondary | ICD-10-CM

## 2019-07-25 DIAGNOSIS — Z885 Allergy status to narcotic agent status: Secondary | ICD-10-CM | POA: Diagnosis not present

## 2019-07-25 DIAGNOSIS — Z882 Allergy status to sulfonamides status: Secondary | ICD-10-CM | POA: Diagnosis not present

## 2019-07-25 DIAGNOSIS — I5032 Chronic diastolic (congestive) heart failure: Secondary | ICD-10-CM | POA: Diagnosis not present

## 2019-07-25 DIAGNOSIS — Z7982 Long term (current) use of aspirin: Secondary | ICD-10-CM | POA: Diagnosis not present

## 2019-07-25 DIAGNOSIS — Z006 Encounter for examination for normal comparison and control in clinical research program: Secondary | ICD-10-CM

## 2019-07-25 HISTORY — PX: RIGHT/LEFT HEART CATH AND CORONARY ANGIOGRAPHY: CATH118266

## 2019-07-25 LAB — POCT I-STAT EG7
Acid-base deficit: 1 mmol/L (ref 0.0–2.0)
Acid-base deficit: 1 mmol/L (ref 0.0–2.0)
Acid-base deficit: 2 mmol/L (ref 0.0–2.0)
Bicarbonate: 25.1 mmol/L (ref 20.0–28.0)
Bicarbonate: 25.1 mmol/L (ref 20.0–28.0)
Bicarbonate: 24.8 mmol/L (ref 20.0–28.0)
Calcium, Ion: 0.98 mmol/L — ABNORMAL LOW (ref 1.15–1.40)
Calcium, Ion: 0.99 mmol/L — ABNORMAL LOW (ref 1.15–1.40)
Calcium, Ion: 0.98 mmol/L — ABNORMAL LOW (ref 1.15–1.40)
HCT: 42 % (ref 36.0–46.0)
HCT: 43 % (ref 36.0–46.0)
HCT: 39 % (ref 36.0–46.0)
Hemoglobin: 14.3 g/dL (ref 12.0–15.0)
Hemoglobin: 14.6 g/dL (ref 12.0–15.0)
Hemoglobin: 13.3 g/dL (ref 12.0–15.0)
O2 Saturation: 75 %
O2 Saturation: 75 %
O2 Saturation: 68 %
Potassium: 4.3 mmol/L (ref 3.5–5.1)
Potassium: 4.3 mmol/L (ref 3.5–5.1)
Potassium: 4.3 mmol/L (ref 3.5–5.1)
Sodium: 138 mmol/L (ref 135–145)
Sodium: 138 mmol/L (ref 135–145)
Sodium: 138 mmol/L (ref 135–145)
TCO2: 26 mmol/L (ref 22–32)
TCO2: 27 mmol/L (ref 22–32)
TCO2: 26 mmol/L (ref 22–32)
pCO2, Ven: 44.8 mmHg (ref 44.0–60.0)
pCO2, Ven: 46.1 mmHg (ref 44.0–60.0)
pCO2, Ven: 48.4 mmHg (ref 44.0–60.0)
pH, Ven: 7.344 (ref 7.250–7.430)
pH, Ven: 7.356 (ref 7.250–7.430)
pH, Ven: 7.318 (ref 7.250–7.430)
pO2, Ven: 42 mmHg (ref 32.0–45.0)
pO2, Ven: 43 mmHg (ref 32.0–45.0)
pO2, Ven: 38 mmHg (ref 32.0–45.0)

## 2019-07-25 LAB — POCT I-STAT 7, (LYTES, BLD GAS, ICA,H+H)
Acid-base deficit: 2 mmol/L (ref 0.0–2.0)
Bicarbonate: 22.1 mmol/L (ref 20.0–28.0)
Calcium, Ion: 0.85 mmol/L — CL (ref 1.15–1.40)
HCT: 39 % (ref 36.0–46.0)
Hemoglobin: 13.3 g/dL (ref 12.0–15.0)
O2 Saturation: 98 %
Potassium: 3.9 mmol/L (ref 3.5–5.1)
Sodium: 142 mmol/L (ref 135–145)
TCO2: 23 mmol/L (ref 22–32)
pCO2 arterial: 36.6 mmHg (ref 32.0–48.0)
pH, Arterial: 7.389 (ref 7.350–7.450)
pO2, Arterial: 100 mmHg (ref 83.0–108.0)

## 2019-07-25 SURGERY — RIGHT/LEFT HEART CATH AND CORONARY ANGIOGRAPHY
Anesthesia: LOCAL

## 2019-07-25 MED ORDER — SODIUM CHLORIDE 0.9 % IV SOLN: 250.0000 mL | INTRAVENOUS | Status: DC | PRN

## 2019-07-25 MED ORDER — LIDOCAINE HCL (PF) 1 % IJ SOLN
INTRAMUSCULAR | Status: AC
  Filled 2019-07-25: qty 30

## 2019-07-25 MED ORDER — SODIUM CHLORIDE 0.9% FLUSH: 3.0000 mL | Freq: Two times a day (BID) | INTRAVENOUS | Status: DC

## 2019-07-25 MED ORDER — HYDRALAZINE HCL 20 MG/ML IJ SOLN: 10.0000 mg | INTRAMUSCULAR | Status: DC | PRN

## 2019-07-25 MED ORDER — ONDANSETRON HCL 4 MG/2ML IJ SOLN: 4.0000 mg | Freq: Four times a day (QID) | INTRAMUSCULAR | Status: DC | PRN

## 2019-07-25 MED ORDER — VERAPAMIL HCL 2.5 MG/ML IV SOLN
INTRAVENOUS | Status: DC | PRN
  Administered 2019-07-25: 10 mL via INTRA_ARTERIAL
  Administered 2019-07-25: 5 mL via INTRA_ARTERIAL

## 2019-07-25 MED ORDER — SODIUM CHLORIDE 0.9 % IV SOLN: INTRAVENOUS | Status: AC

## 2019-07-25 MED ORDER — FENTANYL CITRATE (PF) 100 MCG/2ML IJ SOLN
INTRAMUSCULAR | Status: DC | PRN
  Administered 2019-07-25 (×2): 25 ug via INTRAVENOUS

## 2019-07-25 MED ORDER — NITROGLYCERIN 1 MG/10 ML FOR IR/CATH LAB
INTRA_ARTERIAL | Status: DC | PRN
  Administered 2019-07-25: 200 ug via INTRA_ARTERIAL

## 2019-07-25 MED ORDER — LABETALOL HCL 5 MG/ML IV SOLN: 10.0000 mg | INTRAVENOUS | Status: DC | PRN

## 2019-07-25 MED ORDER — ACETAMINOPHEN 325 MG PO TABS: 650.0000 mg | ORAL_TABLET | ORAL | Status: DC | PRN

## 2019-07-25 MED ORDER — HEPARIN SODIUM (PORCINE) 1000 UNIT/ML IJ SOLN
INTRAMUSCULAR | Status: DC | PRN
  Administered 2019-07-25: 5000 [IU] via INTRAVENOUS

## 2019-07-25 MED ORDER — VERAPAMIL HCL 2.5 MG/ML IV SOLN
INTRAVENOUS | Status: AC
  Filled 2019-07-25: qty 2

## 2019-07-25 MED ORDER — ATROPINE SULFATE 1 MG/10ML IJ SOSY
PREFILLED_SYRINGE | INTRAMUSCULAR | Status: AC
  Filled 2019-07-25: qty 10

## 2019-07-25 MED ORDER — HEPARIN (PORCINE) IN NACL 1000-0.9 UT/500ML-% IV SOLN
INTRAVENOUS | Status: AC
  Filled 2019-07-25: qty 1000

## 2019-07-25 MED ORDER — HEPARIN SODIUM (PORCINE) 1000 UNIT/ML IJ SOLN
INTRAMUSCULAR | Status: AC
  Filled 2019-07-25: qty 1

## 2019-07-25 MED ORDER — LIDOCAINE HCL (PF) 1 % IJ SOLN
INTRAMUSCULAR | Status: DC | PRN
  Administered 2019-07-25: 2 mL via INTRADERMAL
  Administered 2019-07-25: 3 mL via INTRADERMAL
  Administered 2019-07-25: 2 mL via INTRADERMAL

## 2019-07-25 MED ORDER — SODIUM CHLORIDE 0.9% FLUSH: 3.0000 mL | INTRAVENOUS | Status: DC | PRN

## 2019-07-25 MED ORDER — ASPIRIN 81 MG PO CHEW
81.0000 mg | CHEWABLE_TABLET | ORAL | Status: AC
  Administered 2019-07-25: 81 mg via ORAL
  Filled 2019-07-25: qty 1

## 2019-07-25 MED ORDER — NITROGLYCERIN 1 MG/10 ML FOR IR/CATH LAB
INTRA_ARTERIAL | Status: AC
  Filled 2019-07-25: qty 10

## 2019-07-25 MED ORDER — MIDAZOLAM HCL 2 MG/2ML IJ SOLN
INTRAMUSCULAR | Status: DC | PRN
  Administered 2019-07-25 (×2): 2 mg via INTRAVENOUS
  Administered 2019-07-25: 1 mg via INTRAVENOUS

## 2019-07-25 MED ORDER — SODIUM CHLORIDE 0.9 % IV SOLN
INTRAVENOUS | Status: DC
  Administered 2019-07-25: 06:00:00 via INTRAVENOUS

## 2019-07-25 MED ORDER — MIDAZOLAM HCL 2 MG/2ML IJ SOLN
INTRAMUSCULAR | Status: AC
  Filled 2019-07-25: qty 2

## 2019-07-25 MED ORDER — FENTANYL CITRATE (PF) 100 MCG/2ML IJ SOLN
INTRAMUSCULAR | Status: AC
  Filled 2019-07-25: qty 2

## 2019-07-25 MED ORDER — ATROPINE SULFATE 1 MG/10ML IJ SOSY
PREFILLED_SYRINGE | INTRAMUSCULAR | Status: DC | PRN
  Administered 2019-07-25 (×3): 0.5 mg via INTRAVENOUS

## 2019-07-25 MED ORDER — HEPARIN (PORCINE) IN NACL 1000-0.9 UT/500ML-% IV SOLN
INTRAVENOUS | Status: DC | PRN
  Administered 2019-07-25 (×3): 500 mL

## 2019-07-25 SURGICAL SUPPLY — 11 items
CATH 5FR JL3.5 JR4 ANG PIG MP (CATHETERS) ×1 IMPLANT
CATH BALLN WEDGE 5F 110CM (CATHETERS) ×1 IMPLANT
CATH INFINITI MULTIPACK ANG 4F (CATHETERS) ×1 IMPLANT
DEVICE RAD TR BAND REGULAR (VASCULAR PRODUCTS) ×1 IMPLANT
GLIDESHEATH SLEND SS 6F .021 (SHEATH) ×1 IMPLANT
GUIDEWIRE INQWIRE 1.5J.035X260 (WIRE) IMPLANT
INQWIRE 1.5J .035X260CM (WIRE) ×2
KIT SINGLE USE MANIFOLD (KITS) ×2 IMPLANT
PACK CARDIAC CATHETERIZATION (CUSTOM PROCEDURE TRAY) ×2 IMPLANT
SHEATH GLIDE SLENDER 4/5FR (SHEATH) ×2 IMPLANT
TRANSDUCER W/STOPCOCK (MISCELLANEOUS) ×2 IMPLANT

## 2019-07-25 NOTE — Interval H&P Note (Signed)
History and Physical Interval Note:  07/25/2019 7:11 AM  [name]  has presented today for surgery, with the diagnosis of congestive heart failure.  The various methods of treatment have been discussed with the patient and family. After consideration of risks, benefits and other options for treatment, the patient has consented to  Procedure(s): RIGHT/LEFT HEART CATH AND CORONARY ANGIOGRAPHY (N/A) and possible percutaneous coronary angioplasty. as a surgical intervention.  The patient's history has been reviewed, patient examined, no change in status, stable for surgery.  I have reviewed the patient's chart and labs.  Questions were answered to the patient's satisfaction.     Alyssah Algeo

## 2019-07-25 NOTE — Discharge Instructions (Signed)
DRINK PLENTY OF FLUIDS FOR THE NEXT 2-3 DAYS.  KEEP ARM ELEVATED THE REMAINDER OF THE DAY.  Radial Site Care  This sheet gives you information about how to care for yourself after your procedure. Your health care provider may also give you more specific instructions. If you have problems or questions, contact your health care provider. What can I expect after the procedure? After the procedure, it is common to have:  Bruising and tenderness at the catheter insertion area. Follow these instructions at home: Medicines  Take over-the-counter and prescription medicines only as told by your health care provider. Insertion site care  Follow instructions from your health care provider about how to take care of your insertion site. Make sure you: ? Wash your hands with soap and water before you change your bandage (dressing). If soap and water are not available, use hand sanitizer. ? Change your dressing as told by your health care provider.   Check your insertion site every day for signs of infection. Check for: ? Redness, swelling, or pain. ? Fluid or blood. ? Pus or a bad smell. ? Warmth.  Do not take baths, swim, or use a hot tub until your health care provider approves.  You may shower 24-48 hours after the procedure. ? Remove the dressing and gently wash the site with plain soap and water. ? Pat the area dry with a clean towel. ? Do not rub the site. That could cause bleeding.  Do not apply powder or lotion to the site. Activity   For 24 hours after the procedure, or as directed by your health care provider: ? Do not flex or bend the affected arm. ? Do not push or pull heavy objects with the affected arm. ? Do not drive yourself home from the hospital or clinic. You may drive 24 hours after the procedure. ? Do not operate machinery or power tools.  Do not lift anything that is heavier than 10 lb for 5 days.  Ask your health care provider when it is okay to: ? Return to  work or school. ? Resume usual physical activities or sports. ? Resume sexual activity. General instructions  If the catheter site starts to bleed, raise your arm and put firm pressure on the site. If the bleeding does not stop, get help right away. This is a medical emergency.  If you went home on the same day as your procedure, a responsible adult should be with you for the first 24 hours after you arrive home.  Keep all follow-up visits as told by your health care provider. This is important. Contact a health care provider if:  You have a fever.  You have redness, swelling, or yellow drainage around your insertion site. Get help right away if:  You have unusual pain at the radial site.  The catheter insertion area swells very fast.  The insertion area is bleeding, and the bleeding does not stop when you hold steady pressure on the area.  Your arm or hand becomes pale, cool, tingly, or numb. These symptoms may represent a serious problem that is an emergency. Do not wait to see if the symptoms will go away. Get medical help right away. Call your local emergency services (911 in the [place] Do not drive yourself to the hospital. Summary  After the procedure, it is common to have bruising and tenderness at the site.  Follow instructions from your health care provider about how to take care of your radial site wound.  Check the wound every day for signs of infection.  Do not lift anything that is heavier than 10 lb for 5 days.  This information is not intended to replace advice given to you by your health care provider. Make sure you discuss any questions you have with your health care provider. Document Released: 09/02/2010 Document Revised: 09/05/2017 Document Reviewed: 09/05/2017 Elsevier Patient Education  2020 [employer].

## 2019-07-25 NOTE — Research (Signed)
[place] Informed Consent                  Subject Name:   Genova Kiner   Subject met inclusion and exclusion criteria.  The informed consent form, study requirements and expectations were reviewed with the subject and questions and concerns were addressed prior to the signing of the consent form.  The subject verbalized understanding of the trial requirements.  The subject agreed to participate in the [facility] trial and signed the informed consent.  The informed consent was obtained prior to performance of any protocol-specific procedures for the subject.  A copy of the signed informed consent was given to the subject and a copy was placed in the subject's medical record.   [place] Diavian Furgason, Research Assistant  07/25/2019 06:48 a.m.

## 2019-07-28 NOTE — Telephone Encounter (Signed)
I LMOM for patient to call, and schedule follow up appointment around 11/06/2019 with Dr. Estanislado Pandy.

## 2019-07-28 NOTE — Telephone Encounter (Signed)
-----   Message from Shona Needles, RT sent at 07/16/2019 11:43 AM EST ----- Regarding: ######DISREGARD PREVIOUS MESSAGE IN OFFICE F/U IN 4 MONTHS

## 2019-07-30 ENCOUNTER — Other Ambulatory Visit (HOSPITAL_COMMUNITY): Payer: PPO

## 2019-08-05 ENCOUNTER — Encounter (HOSPITAL_COMMUNITY): Payer: Self-pay

## 2019-08-05 ENCOUNTER — Encounter (HOSPITAL_COMMUNITY): Payer: PPO | Admitting: Internal Medicine

## 2019-08-05 ENCOUNTER — Ambulatory Visit (HOSPITAL_COMMUNITY)
Admission: RE | Admit: 2019-08-05 | Discharge: 2019-08-05 | Disposition: A | Discharge status: Home / Self Care | Payer: PPO | Source: Ambulatory Visit | Attending: Internal Medicine | Admitting: Internal Medicine

## 2019-08-05 ENCOUNTER — Encounter (HOSPITAL_COMMUNITY): Payer: Self-pay | Admitting: Internal Medicine

## 2019-08-05 ENCOUNTER — Other Ambulatory Visit: Payer: Self-pay

## 2019-08-05 VITALS — BP 128/76 | HR 80 | Wt 245.2 lb

## 2019-08-05 DIAGNOSIS — G4733 Obstructive sleep apnea (adult) (pediatric): Secondary | ICD-10-CM | POA: Diagnosis not present

## 2019-08-05 DIAGNOSIS — Z6841 Body Mass Index (BMI) 40.0 and over, adult: Secondary | ICD-10-CM | POA: Insufficient documentation

## 2019-08-05 DIAGNOSIS — R0609 Other forms of dyspnea: Secondary | ICD-10-CM

## 2019-08-05 DIAGNOSIS — Z91041 Radiographic dye allergy status: Secondary | ICD-10-CM | POA: Insufficient documentation

## 2019-08-05 DIAGNOSIS — Z8249 Family history of ischemic heart disease and other diseases of the circulatory system: Secondary | ICD-10-CM | POA: Insufficient documentation

## 2019-08-05 DIAGNOSIS — Z825 Family history of asthma and other chronic lower respiratory diseases: Secondary | ICD-10-CM | POA: Insufficient documentation

## 2019-08-05 DIAGNOSIS — Z888 Allergy status to other drugs, medicaments and biological substances status: Secondary | ICD-10-CM | POA: Diagnosis not present

## 2019-08-05 DIAGNOSIS — M797 Fibromyalgia: Secondary | ICD-10-CM | POA: Diagnosis not present

## 2019-08-05 DIAGNOSIS — G909 Disorder of the autonomic nervous system, unspecified: Secondary | ICD-10-CM | POA: Insufficient documentation

## 2019-08-05 DIAGNOSIS — Z79899 Other long term (current) drug therapy: Secondary | ICD-10-CM | POA: Insufficient documentation

## 2019-08-05 DIAGNOSIS — Z885 Allergy status to narcotic agent status: Secondary | ICD-10-CM | POA: Diagnosis not present

## 2019-08-05 DIAGNOSIS — I2510 Atherosclerotic heart disease of native coronary artery without angina pectoris: Secondary | ICD-10-CM | POA: Diagnosis not present

## 2019-08-05 DIAGNOSIS — M069 Rheumatoid arthritis, unspecified: Secondary | ICD-10-CM | POA: Insufficient documentation

## 2019-08-05 DIAGNOSIS — R0600 Dyspnea, unspecified: Secondary | ICD-10-CM | POA: Diagnosis not present

## 2019-08-05 DIAGNOSIS — I5032 Chronic diastolic (congestive) heart failure: Secondary | ICD-10-CM | POA: Diagnosis not present

## 2019-08-05 DIAGNOSIS — Z7982 Long term (current) use of aspirin: Secondary | ICD-10-CM | POA: Insufficient documentation

## 2019-08-05 DIAGNOSIS — Z882 Allergy status to sulfonamides status: Secondary | ICD-10-CM | POA: Insufficient documentation

## 2019-08-05 LAB — BASIC METABOLIC PANEL
Anion gap: 13 (ref 5–15)
BUN: 14 mg/dL (ref 8–23)
CO2: 24 mmol/L (ref 22–32)
Calcium: 9 mg/dL (ref 8.9–10.3)
Chloride: 103 mmol/L (ref 98–111)
Creatinine, Ser: 0.89 mg/dL (ref 0.44–1.00)
GFR calc Af Amer: >60 mL/min (ref >60)
GFR calc non Af Amer: >60 mL/min (ref >60)
Glucose, Bld: 127 mg/dL — ABNORMAL HIGH (ref 70–99)
Potassium: 4.1 mmol/L (ref 3.5–5.1)
Sodium: 140 mmol/L (ref 135–145)

## 2019-08-05 NOTE — Progress Notes (Signed)
Advanced [facility] Failure Clinic Note   Referring Physician: PCP: Mellody Dance, DO PCP-Cardiologist: Fransico Him, MD  [facility]: Dr. Haroldine Laws   HPI:  Ms Tavella is a 68 year old with a history of PVCs, morbid obesity, OSA on CPAP, RA, and chronic diastolic heart failure. Previously followed by Dr. Radford Pax but was referred to the [facility] in 2019 for management of diastolic HF given difficulties managing volume despite diuretics.   Had initial consult w/ Dr. Haroldine Laws. Says she started having problems with volume overload in July 2018. Has tried multiple diuretics but says nothing has worked very well except lasix and metolazone but that bottoms out her potassium. Last seen on 08/1717. At that time torsemide was increased to 60 mg twice a day due to volume overload. Weight at that time was 235 pounds. Per initial consult, there was low suspicion for TTR amyloid at the time.   Was seen in ER on 2/13 with CP. Troponin 0.0. CT chest no PE. Minimal coronary calcium. Diagnosed with pleurisy.  Seen in clinic 10/11/2017 and her volume status was felt to be stable. At that time, her diuretic regimen included torsemide 60 mg bid + spiro 25 mg bid and 2.5 mg of metolazone PRN. She was lost to f/u for a period of time but had seen general cardiology. She returned to AHF clinic 07/07/19 w/ complaints of progressive exertional and resting dyspnea over the last 2 months. Also noted ~20 lb weight gain over that time frame. Reported compliance w/ diuretics. However her diuretic regimen was completely changed and it was not well outlined when and why regimen was altered. Pt was also unsure herself and was on a very low dose of Lasix, 20 mg daily. Was still on spiro 25 mg bid. Abdomen was obese on examination and she had 1+ bilateral LEE on exam, R>L. She has never had a RHC. She denied CP. She has history of polyneuropathy. Recent 2D echo 11/10 showed normal LVEF, moderate LVH, G1DD, normal RV and normal R/LA size. No  significant valvular disease. No estimated RVSP on report. Her diuretics were changed. Lasix was discontinued she she was placed back on Torsemide 40 mg daily.   Underwent R/L cath on 07/25/19 Ao = 129/72 (97) LV = 128/16 RA = 5 RV = 26/8 PA = 25/11 (18) PCW = 7 Fick cardiac output/index = 5.9/2.8 PVR = 1.9 WU Ao sat = 96% PA sat = 68%, 75%  Assessment: 1. Mild coronary plaque 2. Normal LV function by echo 3. Normal hemodynamics   She is here for post-cath follow-up. Still SOB with activity. Weight up and down. Drinking a lot fluid. Not dizzy. Occasional edema. Wearing CPAP religiously. Ambulation limited by arthritis and back pain.     ECHO 06/24/2019  1. Left ventricular ejection fraction, by visual estimation, is 60 to 65%. The left ventricle has normal function. There is moderately increased left ventricular hypertrophy.  2. Left ventricular diastolic parameters are consistent with Grade I diastolic dysfunction (impaired relaxation).  3. Global right ventricle has normal systolic function.The right ventricular size is normal. No increase in right ventricular wall thickness.    Past Medical History:  Diagnosis Date  . Arthritis    Rheumatoid arthritis,   . Blood transfusion    1981  . Chronic diastolic CHF (congestive heart failure) (HCC)    diastolic   . Fibromyalgia   . Heart murmur    as a child  . Hiatal hernia    sjorgens syndrome  . OSA (  obstructive sleep apnea)   . Peripheral autonomic neuropathy of unknown cause   . Pneumonia 07/2018  . Urticaria     Current Outpatient Medications  Medication Sig Dispense Refill  . aspirin EC 81 MG tablet Take 81 mg by mouth at bedtime.    [name] atorvastatin (LIPITOR) 20 MG tablet TAKE 1 TABLET BY MOUTH DAILY. 90 tablet 1  . carbamazepine (TEGRETOL) 200 MG tablet Take 1 tablet (200 mg total) by mouth at bedtime. 90 tablet 3  . cycloSPORINE (RESTASIS) 0.05 % ophthalmic emulsion Place 2 drops into both eyes 2 (two) times  daily.     . diphenoxylate-atropine (LOMOTIL) 2.5-0.025 MG per tablet Take 1 tablet by mouth 4 (four) times daily as needed for diarrhea or loose stools.    . fluticasone (FLONASE) 50 MCG/ACT nasal spray Use 1 spray per nostril 1-2 times daily as needed 16 g 5  . gabapentin (NEURONTIN) 300 MG capsule TAKE 2 CAPSULES IN THE MORNING AND 3 CAPSULES AT BEDTIME 450 capsule 3  . HYDROcodone-acetaminophen (NORCO/VICODIN) 5-325 MG tablet TAKE 1 TABLET BY MOUTH AT BEDTIME AS NEEDED. 30 tablet 0  . hydroxychloroquine (PLAQUENIL) 200 MG tablet TAKE 1 TABLET BY MOUTH TWICE A DAY MONDAY THROUGH FRIDAY. 120 tablet 1  . hyoscyamine (ANASPAZ) 0.125 MG TBDP disintergrating tablet Place 0.25 mg under the tongue every 6 (six) hours as needed for cramping.     . leflunomide (ARAVA) 20 MG tablet TAKE 1 TABLET BY MOUTH DAILY. 90 tablet 0  . levocetirizine (XYZAL) 5 MG tablet TAKE 1 TABLET BY MOUTH EVERY EVENING. 30 tablet 5  . lidocaine (LIDODERM) 5 % PLACE 1 PATCH ONTO THE SKIN DAILY AS NEEDED (FOR PAIN). REMOVE & DISCARD PATCH WITHIN 12 HOURS OR AS DIRECTED BY MD 90 patch 0  . metoprolol succinate (TOPROL-XL) 25 MG 24 hr tablet TAKE 1 TABLET BY MOUTH DAILY. 90 tablet 3  . NONFORMULARY OR COMPOUNDED ITEM Triamcinolone 0.1% & Silvadene cream 1:1- Apply as directed to affected areas as needed    . potassium chloride (KLOR-CON) 10 MEQ tablet Take 2 tablets (20 mEq total) by mouth daily. 186 tablet 3  . promethazine (PHENERGAN) 25 MG tablet TAKE 1 TABLET BY MOUTH EVERY 6 HOURS AS NEEDED. 120 tablet 2  . spironolactone (ALDACTONE) 25 MG tablet Take 1 tablet (25 mg total) by mouth 2 (two) times daily. 180 tablet 1  . tiZANidine (ZANAFLEX) 4 MG tablet TAKE 1 TABLET BY MOUTH AT BEDTIME. 90 tablet 0  . torsemide (DEMADEX) 20 MG tablet Take 2 tablets (40 mg total) by mouth daily. 60 tablet 3  . UNABLE TO FIND CPAP: AT bedtime; setting is "12"    . Vitamin D, Ergocalciferol, (DRISDOL) 1.25 MG (50000 UT) CAPS capsule TAKE 1  CAPSULE BY MOUTH EVERY 7 DAYS. 12 capsule 10  . predniSONE (DELTASONE) 50 MG tablet Take 50mg  (1 tab) 13 hours, 7 hours and 1 hour before your procedure 3 tablet 0   No current facility-administered medications for this encounter.    Allergies  Allergen Reactions  . Sulfa Antibiotics Hives and Itching    Flushing also  . Cymbalta [Duloxetine Hcl] Other (See Comments)    Caused a manic reaction  . Demerol Hives and Other (See Comments)    Fever also  . Ivp Dye [Iodinated Diagnostic Agents] Hives and Other (See Comments)    Fever also  . Morphine And Related Other (See Comments)    ineffective  . Sulfasalazine Other (See Comments)    Reaction ??  .  Adhesive [Tape] Rash      Social History   Socioeconomic History  . Marital status: Widowed    Spouse name: Not on file  . Number of children: 2  . Years of education: AS  . Highest education level: Not on file  Occupational History  . Occupation: disability  . Occupation: Therapist, sports  Tobacco Use  . Smoking status: Never Smoker  . Smokeless tobacco: Never Used  Substance and Sexual Activity  . Alcohol use: Yes    Comment: socially   . Drug use: No  . Sexual activity: Never  Other Topics Concern  . Not on file  Social History Narrative   Lives at home alone   Right-handed   Drinks 1 or less cups of coffee and 2 servings of either tea or soda per day   Social Determinants of Health   Financial Resource Strain:   . Difficulty of Paying Living Expenses: Not on file  Food Insecurity:   . Worried About Charity fundraiser in the Last Year: Not on file  . Ran Out of Food in the Last Year: Not on file  Transportation Needs:   . Lack of Transportation (Medical): Not on file  . Lack of Transportation (Non-Medical): Not on file  Physical Activity:   . Days of Exercise per Week: Not on file  . Minutes of Exercise per Session: Not on file  Stress:   . Feeling of Stress : Not on file  Social Connections:   . Frequency of  Communication with Friends and Family: Not on file  . Frequency of Social Gatherings with Friends and Family: Not on file  . Attends Religious Services: Not on file  . Active Member of Clubs or Organizations: Not on file  . Attends Archivist Meetings: Not on file  . Marital Status: Not on file  Intimate Partner Violence:   . Fear of Current or Ex-Partner: Not on file  . Emotionally Abused: Not on file  . Physically Abused: Not on file  . Sexually Abused: Not on file      Family History  Problem Relation Age of Onset  . Alzheimer's disease Mother   . Heart attack Mother   . Hypertension Mother   . Glaucoma Mother   . Cancer Father   . Heart attack Brother   . Heart disease Brother   . Glaucoma Brother   . Hyperlipidemia Brother   . Glaucoma Brother   . Asthma Son   . Allergic rhinitis Neg Hx   . Angioedema Neg Hx   . Eczema Neg Hx   . Urticaria Neg Hx     Vitals:   08/05/19 1044  BP: 128/76  Pulse: 80  SpO2: 96%  Weight: 111.2 kg (245 lb 3.2 oz)    PHYSICAL EXAM: General:  Well appearing. No resp difficulty HEENT: normal Neck: supple. no JVD. Carotids 2+ bilat; no bruits. No lymphadenopathy or thryomegaly appreciated. Cor: PMI nondisplaced. Regular rate & rhythm. No rubs, gallops or murmurs. Lungs: clear Abdomen: obese soft, nontender, nondistended. No hepatosplenomegaly. No bruits or masses. Good bowel sounds. Extremities: no cyanosis, clubbing, rash, 1+ edema Neuro: alert & orientedx3, cranial nerves grossly intact. moves all 4 extremities w/o difficulty. Affect pleasant   ASSESSMENT & PLAN:  1. Dyspnea/Chronic Diastolic HF:  - 2D echo 16/10 showed normal LVEF, moderate LVH, G1DD, normal RV and normal R/LA size. No significant valvular disease. No estimated RVSP on report.  - cath 07/25/19 with normal coronaries and filling  pressures. Likely has mild diastolic HF but currently well controlled. - Doubt HF is major component to her symptoms. -  continue torsemide 40 mg daily. Need to make sure she isn't over diuresed. Check BMET today - continue  blocker therapy w/ metoprolol.  - Needs more exercise and weight loss - I explained to her that I suspect her obesity is major driver of her dyspnea but she seemed to be dubious of this as she said she has been heavy for a long time but the SOB started much more acutely. I explained that we lose 10% of our muscle mass every decade after age 10 and that over time the ratio of muscle to fat goes down and patients often develop symptoms in their 50-60s as this ratio becomes more lopsided. She was ~110 pounds when she graduated HS. I set goal of HS + 15 pounds for dry weight for most people. She is now 245 pounds. I suggested losing at least 40 pounds and suspect symptoms will improve greatly.   2. OSA: continue CPAP. Followed by Dr. Radford Pax  3. Obesity: encouraged wt loss as above. Again discussed [facility] and f/u with Dr. Radford Pax as needed.   Glori Bickers, MD 08/05/19

## 2019-08-05 NOTE — Patient Instructions (Signed)
Labs done today, we will notify you for abnormal results  Please follow up with Dr Radford Pax in March 2021

## 2019-08-12 ENCOUNTER — Other Ambulatory Visit: Payer: Self-pay | Admitting: Family Medicine

## 2019-08-12 ENCOUNTER — Other Ambulatory Visit: Payer: Self-pay | Admitting: Rheumatology

## 2019-08-12 ENCOUNTER — Telehealth: Payer: Self-pay | Admitting: Family Medicine

## 2019-08-12 DIAGNOSIS — M359 Systemic involvement of connective tissue, unspecified: Secondary | ICD-10-CM

## 2019-08-12 DIAGNOSIS — R748 Abnormal levels of other serum enzymes: Secondary | ICD-10-CM

## 2019-08-12 DIAGNOSIS — I5032 Chronic diastolic (congestive) heart failure: Secondary | ICD-10-CM

## 2019-08-12 DIAGNOSIS — E782 Mixed hyperlipidemia: Secondary | ICD-10-CM

## 2019-08-12 DIAGNOSIS — E559 Vitamin D deficiency, unspecified: Secondary | ICD-10-CM

## 2019-08-12 DIAGNOSIS — Z8639 Personal history of other endocrine, nutritional and metabolic disease: Secondary | ICD-10-CM

## 2019-08-12 DIAGNOSIS — R7302 Impaired glucose tolerance (oral): Secondary | ICD-10-CM

## 2019-08-12 DIAGNOSIS — I7300 Raynaud's syndrome without gangrene: Secondary | ICD-10-CM

## 2019-08-12 NOTE — Telephone Encounter (Signed)
Virtual appointment has been made for 08/27/2019 and fasting labs a week prior to that. AM

## 2019-08-12 NOTE — Telephone Encounter (Signed)
Patient called and was questioning when she was due for another Cholesterol check. Please Advise. AM

## 2019-08-12 NOTE — Telephone Encounter (Signed)
Per Dr. Raliegh Scarlet patient needs 'chronic care' follow up. Please schedule patient to come in for fasting labs and then schedule patient a doxy or telehealth for 1 week after for chronic issues. Thank you, AS, CMA

## 2019-08-12 NOTE — Telephone Encounter (Signed)
Noted. AS, CMA 

## 2019-08-13 NOTE — Telephone Encounter (Signed)
Last Visit: 07/14/2019 telemedicine  Next Visit: message sent to the front desk to schedule.   Okay to refill per Dr. Estanislado Pandy.

## 2019-08-20 ENCOUNTER — Other Ambulatory Visit: Payer: PPO

## 2019-08-25 ENCOUNTER — Ambulatory Visit: Payer: PPO | Admitting: Family Medicine

## 2019-08-26 ENCOUNTER — Other Ambulatory Visit: Payer: PPO

## 2019-08-26 ENCOUNTER — Other Ambulatory Visit: Payer: Self-pay

## 2019-08-26 DIAGNOSIS — I7300 Raynaud's syndrome without gangrene: Secondary | ICD-10-CM

## 2019-08-26 DIAGNOSIS — M359 Systemic involvement of connective tissue, unspecified: Secondary | ICD-10-CM

## 2019-08-26 DIAGNOSIS — E559 Vitamin D deficiency, unspecified: Secondary | ICD-10-CM

## 2019-08-26 DIAGNOSIS — Z8639 Personal history of other endocrine, nutritional and metabolic disease: Secondary | ICD-10-CM

## 2019-08-26 DIAGNOSIS — E782 Mixed hyperlipidemia: Secondary | ICD-10-CM | POA: Diagnosis not present

## 2019-08-26 DIAGNOSIS — R7302 Impaired glucose tolerance (oral): Secondary | ICD-10-CM | POA: Diagnosis not present

## 2019-08-26 DIAGNOSIS — I5032 Chronic diastolic (congestive) heart failure: Secondary | ICD-10-CM

## 2019-08-26 DIAGNOSIS — R748 Abnormal levels of other serum enzymes: Secondary | ICD-10-CM

## 2019-08-27 ENCOUNTER — Ambulatory Visit: Payer: PPO | Admitting: Family Medicine

## 2019-08-27 ENCOUNTER — Ambulatory Visit
Admission: RE | Admit: 2019-08-27 | Discharge: 2019-08-27 | Disposition: A | Discharge status: Home / Self Care | Payer: PPO | Source: Ambulatory Visit | Attending: Family Medicine | Admitting: Family Medicine

## 2019-08-27 DIAGNOSIS — M85851 Other specified disorders of bone density and structure, right thigh: Secondary | ICD-10-CM | POA: Diagnosis not present

## 2019-08-27 DIAGNOSIS — Z780 Asymptomatic menopausal state: Secondary | ICD-10-CM | POA: Diagnosis not present

## 2019-08-27 DIAGNOSIS — E2839 Other primary ovarian failure: Secondary | ICD-10-CM

## 2019-08-27 LAB — CBC WITH DIFFERENTIAL/PLATELET
Basophils Absolute: 0.1 10*3/uL (ref 0.0–0.2)
Basos: 1 %
EOS (ABSOLUTE): 0.1 10*3/uL (ref 0.0–0.4)
Eos: 2 %
Hematocrit: 43.3 % (ref 34.0–46.6)
Hemoglobin: 14.4 g/dL (ref 11.1–15.9)
Immature Grans (Abs): 0 10*3/uL (ref 0.0–0.1)
Immature Granulocytes: 1 %
Lymphocytes Absolute: 1.4 10*3/uL (ref 0.7–3.1)
Lymphs: 26 %
MCH: 28.8 pg (ref 26.6–33.0)
MCHC: 33.3 g/dL (ref 31.5–35.7)
MCV: 87 fL (ref 79–97)
Monocytes Absolute: 0.5 10*3/uL (ref 0.1–0.9)
Monocytes: 10 %
Neutrophils Absolute: 3.2 10*3/uL (ref 1.4–7.0)
Neutrophils: 60 %
Platelets: 191 10*3/uL (ref 150–450)
RBC: 5 x10E6/uL (ref 3.77–5.28)
RDW: 13 % (ref 11.7–15.4)
WBC: 5.3 10*3/uL (ref 3.4–10.8)

## 2019-08-27 LAB — COMPREHENSIVE METABOLIC PANEL
ALT: 23 IU/L (ref 0–32)
AST: 25 IU/L (ref 0–40)
Albumin/Globulin Ratio: 1.7 (ref 1.2–2.2)
Albumin: 4.1 g/dL (ref 3.8–4.8)
Alkaline Phosphatase: 111 IU/L (ref 39–117)
BUN/Creatinine Ratio: 22 (ref 12–28)
BUN: 18 mg/dL (ref 8–27)
Bilirubin Total: 0.4 mg/dL (ref 0.0–1.2)
CO2: 22 mmol/L (ref 20–29)
Calcium: 9.2 mg/dL (ref 8.7–10.3)
Chloride: 101 mmol/L (ref 96–106)
Creatinine, Ser: 0.82 mg/dL (ref 0.57–1.00)
GFR calc Af Amer: 86 mL/min/{1.73_m2} (ref >59)
GFR calc non Af Amer: 74 mL/min/{1.73_m2} (ref >59)
Globulin, Total: 2.4 g/dL (ref 1.5–4.5)
Glucose: 121 mg/dL — ABNORMAL HIGH (ref 65–99)
Potassium: 3.9 mmol/L (ref 3.5–5.2)
Sodium: 139 mmol/L (ref 134–144)
Total Protein: 6.5 g/dL (ref 6.0–8.5)

## 2019-08-27 LAB — T4, FREE: Free T4: 0.89 ng/dL (ref 0.82–1.77)

## 2019-08-27 LAB — HEMOGLOBIN A1C
Est. average glucose Bld gHb Est-mCnc: 128 mg/dL
Hgb A1c MFr Bld: 6.1 % — ABNORMAL HIGH (ref 4.8–5.6)

## 2019-08-27 LAB — VITAMIN D 25 HYDROXY (VIT D DEFICIENCY, FRACTURES): Vit D, 25-Hydroxy: 61.3 ng/mL (ref 30.0–100.0)

## 2019-08-27 LAB — LIPID PANEL
Chol/HDL Ratio: 3.5 ratio (ref 0.0–4.4)
Cholesterol, Total: 160 mg/dL (ref 100–199)
HDL: 46 mg/dL (ref >39)
LDL Chol Calc (NIH): 63 mg/dL (ref 0–99)
Triglycerides: 325 mg/dL — ABNORMAL HIGH (ref 0–149)
VLDL Cholesterol Cal: 51 mg/dL — ABNORMAL HIGH (ref 5–40)

## 2019-08-27 LAB — TSH: TSH: 1.52 u[IU]/mL (ref 0.450–4.500)

## 2019-09-02 ENCOUNTER — Ambulatory Visit (INDEPENDENT_AMBULATORY_CARE_PROVIDER_SITE_OTHER): Payer: PPO | Admitting: Family Medicine

## 2019-09-02 ENCOUNTER — Encounter: Payer: Self-pay | Admitting: Family Medicine

## 2019-09-02 ENCOUNTER — Other Ambulatory Visit: Payer: Self-pay

## 2019-09-02 VITALS — BP 144/94 | HR 69 | Ht 62.0 in | Wt 238.0 lb

## 2019-09-02 DIAGNOSIS — I5032 Chronic diastolic (congestive) heart failure: Secondary | ICD-10-CM

## 2019-09-02 DIAGNOSIS — R748 Abnormal levels of other serum enzymes: Secondary | ICD-10-CM

## 2019-09-02 DIAGNOSIS — Z780 Asymptomatic menopausal state: Secondary | ICD-10-CM

## 2019-09-02 DIAGNOSIS — E559 Vitamin D deficiency, unspecified: Secondary | ICD-10-CM

## 2019-09-02 DIAGNOSIS — Z79899 Other long term (current) drug therapy: Secondary | ICD-10-CM

## 2019-09-02 DIAGNOSIS — E782 Mixed hyperlipidemia: Secondary | ICD-10-CM

## 2019-09-02 DIAGNOSIS — R7303 Prediabetes: Secondary | ICD-10-CM | POA: Diagnosis not present

## 2019-09-02 MED ORDER — GNP CALCIUM 1200 1200-1000 MG-UNIT PO CHEW: CHEWABLE_TABLET | ORAL | Status: DC

## 2019-09-02 NOTE — Progress Notes (Signed)
Telehealth office visit note for [name], D.O- at Primary Care at [facility]   I connected with current patient today and verified that I am speaking with the correct person using two identifiers.   . Location of the patient: Home . Location of the provider: Office Only the patient (+/- their family members at pt's discretion) and myself were participating in the encounter - This visit type was conducted due to national recommendations for restrictions regarding the COVID-19 Pandemic (e.g. social distancing) in an effort to limit this patient's exposure and mitigate transmission in our community.  This format is felt to be most appropriate for this patient at this time.   - No physical exam could be performed with this format, beyond that communicated to [place] by the patient/ family members as noted.   - Additionally my office staff/ schedulers discussed with the patient that there may be a monetary charge related to this service, depending on their medical insurance.   The patient expressed understanding, and agreed to proceed.       History of Present Illness: Hyperlipidemia    I, [name], am serving as scribe for Dr. Mellody Cooper.   She is going to get her first dose of the COVID-19 vaccine tomorrow.  Patient notes "well, I'm alive."  Emotionally states she's been "okay, alright."  Dealing with stress "pretty good I guess."   - MO:  Eating Habits & Weight Notes "I don't really get hungry."  Says "a lot of days, I may not eat but one time; I eat twice at the most."  Says when she goes to make the food, she eats 5-6 bites and gets full, and puts the rest away for later.  She has lost 11 lbs since November and is interested in assistance with weight loss.   - Glucose Intolerance Notes she will never take metformin for glucose intolerance because everyone she has ever known has had trouble with diarrhea on metformin.    HTN and Chronic Diastolic CHF  -  Her blood pressure at home has been running: "Most of the time it's okay."  Typically runs 126, 118, 133/77, mid to upper 70's.  Says sometimes it'll go up to 145/85.  States "I have no idea why it's high today."  Denies CHF sx-  Per patient, she is on the beta blocker strictly to manage her PVC's.  Says "I don't have to go to the heart failure clinic anymore because I graduated."    ---> Notes she had a lot occur during her last visits to the heart failure clinic, originally going in for assessment because she "gained 20 lbs of fluid very quickly".  Says she still gets a little bit SOB walking to the end of the driveway and back, but "nowhere near like it was."   Denies chest pain or tightness; states "I still have fluid, it is what it is."    - Patient reports good compliance with medication  - Her denies acute concerns or problems related to treatment plan  - She denies new onset of: chest pain, exercise intolerance, shortness of breath, dizziness, visual changes, headache, lower extremity swelling or claudication.   Last 3 blood pressure readings in our office are as follows: BP Readings from Last 3 Encounters:  09/02/19 (!) 144/94  08/05/19 128/76  07/25/19 115/68   Filed Weights   09/02/19 1010  Weight: 238 lb (108 kg)      HPI:  Hyperlipidemia:  69 y.o. female here for cholesterol follow-up.   - Patient reports good compliance with treatment plan of:  medication and/ or lifestyle management.    - Patient denies any acute concerns or problems with management plan   - She denies new onset of: myalgias, arthralgias, increased fatigue more than normal, chest pains, exercise intolerance, shortness of breath, dizziness, visual changes, headache, lower extremity swelling or claudication.   Most recent cholesterol panel was:  Lab Results  Component Value Date   CHOL 160 08/26/2019   HDL 46 08/26/2019   LDLCALC 63 08/26/2019   TRIG 325 (H) 08/26/2019   CHOLHDL 3.5  08/26/2019   Hepatic Function Latest Ref Rng & Units 08/26/2019 07/07/2019 02/10/2019  Total Protein 6.0 - 8.5 g/dL 6.5 6.3(L) 6.4  Albumin 3.8 - 4.8 g/dL 4.1 3.5 -  AST 0 - 40 IU/L '25 21 21  '$ ALT 0 - 32 IU/L '23 23 20  '$ Alk Phosphatase 39 - 117 IU/L 111 79 -  Total Bilirubin 0.0 - 1.2 mg/dL 0.4 1.0 0.4  Bilirubin, Direct 0.0 - 0.3 mg/dL - - -     Depression screen [facility] 2/9 09/02/2019 06/17/2019 04/29/2019 08/20/2018 04/25/2018  Decreased Interest 0 0 0 0 0  Down, Depressed, Hopeless 0 1 0 0 0  PHQ - 2 Score 0 1 0 0 0  Altered sleeping 1 3 0 1 1  Tired, decreased energy 0 2 0 1 1  Change in appetite 0 0 0 1 0  Feeling bad or failure about yourself  0 0 0 0 0  Trouble concentrating 0 0 0 0 0  Moving slowly or fidgety/restless 0 0 0 0 0  Suicidal thoughts 0 0 0 0 0  PHQ-9 Score 1 6 0 3 2  Difficult doing work/chores Not difficult at all Not difficult at all Not difficult at all Not difficult at all Not difficult at all  Some recent data might be hidden      Impression and Recommendations:    1. Chronic diastolic CHF (congestive heart failure) ([place])   2. Morbid obesity ([place])   3. Mixed hyperlipidemia   4. Low serum HDL   5. Elevated triglycerides with high cholesterol   6. Prediabetes   7. Post-menopausal   8. Vitamin D deficiency   9. High risk medication use      - Reviewed recent lab work (08/26/2019) in depth with patient today.  All lab work within normal limits unless otherwise noted.  Extensive education provided and all questions answered.   HTN and Chronic Diastolic CHF  -  Goal BP less than 140/90---> NOT at goal today but most all home values at goal. - Pt will focus on healthy eating, less salt and wt loss as txmnt plan now in addition to med mgt - Patient is monitored by cardiology for CHF. - Blood pressure currently is stable, per patient overall in 120's-130's/70's-84.  - Patient will continue current treatment regimen plus wt loss. - Patient will continue to  obtain relevant medications and management through specialist as well as monitoring by [place].  - Counseled patient on pathophysiology of disease and discussed various treatment options, which always includes dietary and lifestyle modification as first line.   - Lifestyle changes such as dash and heart healthy diets and engaging in a regular exercise program discussed extensively with patient.   - Rec Wt Loss.   - Ambulatory blood pressure monitoring encouraged at least 3 times weekly.  Keep log and bring in every  office visit.  Reminded patient that if they ever feel poorly in any way, to check their blood pressure and pulse.  - We will continue to monitor alongside Cards    Vitamin D Deficiency - Stable at 61.3 last check, down from 72.7 one year prior. - Discussed need for maintenance in 40-60 range. - Continue management as established.  See med list.  - Will continue to monitor.   Post-Menopausal Calcium Supplementation - Given her age, advised patient to add 1200 mg calcium to her treatment plan. - Begin supplementation as advised. - Will continue to monitor.   Glucose Intolerance/ Pre-DM - A1c elevated to 6.1, elevated, up from 5.6 prior, worsening.  - Counseled patient on prevention of diabetes and discussed dietary and lifestyle modification as first line.    - Importance of low carb, heart-healthy diet discussed with patient in addition to regular aerobic exercise of 81mn 5d/week or more.   - Importance of weight loss emphasized with patient.  Discussed option of metformin for assistance.  Patient declined metformin today.  - Will continue to monitor and re-check as discussed.    Mixed Hyperlipidemia, Low Serum HDL, Elevated Triglycerides - Last FLP obtained 08/26/2019: Triglycerides = 325, elevated, up from 233 prior. HDL = 46, WNL, stable from 45 prior. LDL = 63, WNL   - Pt will continue current treatment regimen.  See med list.  - Prudent dietary changes such  as low saturated & trans fat diets for hyperlipidemia and low carb diets and dec intake for hypertriglyceridemia discussed with patient.    - To improve HDL, encouraged patient to follow AHA guidelines for regular exercise and also engage in weight loss if BMI above 25.   - We will continue to monitor and re-check in 4-6 months as discussed.   BMI Counseling - Body mass index is 43.53 kg/m, Morbid Obesity - Worsening at this time.  - Explained to patient what BMI refers to, and what it means medically.    Told patient to think about it as a "medical risk stratification measurement" and how increasing BMI is associated with increasing risk/ or worsening state of various diseases such as hypertension, hyperlipidemia, diabetes, premature OA, depression etc.  - American Heart Association guidelines for healthy diet, basically Mediterranean diet, and exercise guidelines of 30 minutes 5 days per week or more discussed in detail.  - Discussed option of referral to Healthy Weight and Wellness, to help implement weight loss to reduce weight-related health risks and improve quality of life.  Ambulatory referral to Healthy Weight and Wellness provided.  - Health counseling performed.  All questions answered.   Lifestyle & Preventative Health Maintenance - Advised patient to continue working toward exercising to improve overall mental, physical, and emotional health.    - Reviewed the "spokes of the wheel" of mood and health management.  Stressed the importance of ongoing prudent habits, including regular exercise, appropriate sleep hygiene, healthful dietary habits, and prayer/meditation to relax.  - Encouraged patient to engage in daily physical activity as tolerated, especially a formal exercise routine.  Recommended that the patient eventually strive for at least 150 minutes of moderate cardiovascular activity per week according to guidelines established by the [facility]   - Healthy dietary habits  encouraged, including low-carb, and high amounts of lean protein in diet.   - Patient should also consume adequate amounts of water, per recommendations of cardiology.  Recommendations - Re-check A1c lab before follow-up OV in 4-6 months ( ONLY IF NOT DONE  BY [facility] which they always do on each pt)   - As part of my medical decision making, I reviewed the following data within the [place] History obtained from pt /family, CMA notes reviewed and incorporated if applicable, Labs reviewed, Radiograph/ tests reviewed if applicable and OV notes from prior OV's with me, as well as other specialists she/he has seen since seeing me last, were all reviewed and used in my medical decision making process today.    - Additionally, discussion had with patient regarding our treatment plan, and their biases/concerns about that plan were used in my medical decision making today.    - The patient agreed with the plan and demonstrated an understanding of the instructions.   No barriers to understanding were identified.    - Red flag symptoms and signs discussed in detail.  Patient expressed understanding regarding what to do in case of emergency\ urgent symptoms.   - The patient was advised to call back or seek an in-person evaluation if the symptoms worsen or if the condition fails to improve as anticipated.   Return for f/up 4-6 months or sooner if changes to lifestyle.    Orders Placed This Encounter  Procedures  . Amb Ref to Medical Weight Management    Meds ordered this encounter  Medications  . Calcium Carbonate-Vit D-Min (GNP CALCIUM 1200) 1200-1000 MG-UNIT CHEW    Sig: Calcium '1200mg'$  qd OTC    Dispense:  30 tablet    Medications Discontinued During This Encounter  Medication Reason  . predniSONE (DELTASONE) 50 MG tablet Error     I provided 30+ minutes of non face-to-face time during this encounter.  Additional time was spent with charting and coordination of care before  and after the actual visit commenced.   Note:  This note was prepared with assistance of Dragon voice recognition software. Occasional wrong-word or sound-a-like substitutions may have occurred due to the inherent limitations of voice recognition software.  This document serves as a record of services personally performed by Marisa Dance, DO. It was created on her behalf by [name], a trained medical scribe. The creation of this record is based on the scribe's personal observations and the provider's statements to them.   This case required medical decision making of at least moderate complexity. The above documentation has been reviewed to be accurate and was completed by Marjory Sneddon, D.O.      Patient Care Team    Relationship Specialty Notifications Start End  Marisa Dance, DO PCP - General Family Medicine  01/19/16   Sueanne Margarita, MD PCP - Cardiology Cardiology Admissions 10/15/18   Bo Merino, MD Consulting Physician Rheumatology  02/02/14   Sueanne Margarita, MD Consulting Physician Cardiology  05/23/16   Gaynelle Arabian, MD Consulting Physician Orthopedic Surgery  05/23/16   Kathrynn Ducking, MD Consulting Physician Neurology  05/23/16   Juanita Craver, MD Consulting Physician Gastroenterology  05/23/16   Martinique, Amy, MD Consulting Physician Dermatology  05/23/16   Corliss Parish, MD Consulting Physician Nephrology  07/02/17    Comment: Seen for hypokalemia.  Bobbitt, Sedalia Muta, MD Consulting Physician Allergy and Immunology  08/20/18      -Vitals obtained; medications/ allergies reconciled;  personal medical, social, Sx etc.histories were updated by CMA, reviewed by me and are reflected in chart   Patient Active Problem List   Diagnosis Date Noted  . Mixed hyperlipidemia 11/20/2017  . Elevated triglycerides with high cholesterol 08/23/2016  . Chronic diastolic  CHF (congestive heart failure) ([place]) 04/20/2015  . Peripheral polyneuropathy  02/02/2014  . Morbid obesity ([place]) 11/17/2013  . OSA (obstructive sleep apnea)   . Low serum HDL 08/23/2016  . GERD (gastroesophageal reflux disease) 01/19/2016  . Glucose intolerance (impaired glucose tolerance) 01/19/2016  . Fibromyalgia   . Sjoegren syndrome 02/29/2016  . Rheumatoid arthritis ([place])   . Post-menopausal 09/02/2019  . Pain in left knee 08/23/2018  . Lumbar pain 07/04/2018  . Recurrent urticaria 05/20/2018  . Rhinitis 05/20/2018  . Vitamin D deficiency 11/20/2017  . Family history of coronary arteriosclerosis- strong fam h/o CAD and early CAD.  07/17/2017  . Raynaud's disease without gangrene 03/12/2017  . Abnormal weight gain 02/06/2017  . Shingles outbreak 02/06/2017  . Cyst (solitary) of breast, left 01/31/2017  . Eosinophilic esophagitis 16/05/9603  . History of hyperlipidemia 10/04/2016  . 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 History of diverticulitis 09/03/2016  . Osteoporosis 09/03/2016  . Autoimmune disease ([place]) 09/02/2016  . High risk medication use 09/02/2016  . History of esophagitis 09/02/2016  . Breast cyst, left 06/14/2016  . Encounter for wellness examination 05/23/2016  . Abnormality of gait 05/09/2016  . Heart murmur   . Hiatal hernia   . PVC's (premature ventricular contractions)   . 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 Unilateral primary osteoarthritis, left knee 06/22/2011     Current Meds  Medication Sig  . aspirin EC 81 MG tablet Take 81 mg by mouth at bedtime.  [name] atorvastatin (LIPITOR) 20 MG tablet TAKE 1 TABLET BY MOUTH DAILY.  . carbamazepine (TEGRETOL) 200 MG tablet Take 1 tablet (200 mg total) by mouth at bedtime.  . cycloSPORINE (RESTASIS) 0.05 % ophthalmic emulsion Place 2 drops into both eyes 2 (two) times daily.   . diphenoxylate-atropine (LOMOTIL) 2.5-0.025 MG per tablet Take 1 tablet by mouth 4 (four) times daily as needed for diarrhea or loose stools.  . fluticasone (FLONASE) 50 MCG/ACT nasal  spray Use 1 spray per nostril 1-2 times daily as needed  . gabapentin (NEURONTIN) 300 MG capsule TAKE 2 CAPSULES IN THE MORNING AND 3 CAPSULES AT BEDTIME  . HYDROcodone-acetaminophen (NORCO/VICODIN) 5-325 MG tablet TAKE 1 TABLET BY MOUTH AT BEDTIME AS NEEDED.  . hydroxychloroquine (PLAQUENIL) 200 MG tablet TAKE 1 TABLET BY MOUTH TWICE A DAY MONDAY THROUGH FRIDAY.  . hyoscyamine (ANASPAZ) 0.125 MG TBDP disintergrating tablet Place 0.25 mg under the tongue every 6 (six) hours as needed for cramping.   . leflunomide (ARAVA) 20 MG tablet TAKE 1 TABLET BY MOUTH DAILY.  [name] levocetirizine (XYZAL) 5 MG tablet TAKE 1 TABLET BY MOUTH EVERY EVENING.  [name] lidocaine (LIDODERM) 5 % PLACE 1 PATCH ONTO THE SKIN DAILY AS NEEDED (FOR PAIN). REMOVE & DISCARD PATCH WITHIN 12 HOURS OR AS DIRECTED BY MD  . metoprolol succinate (TOPROL-XL) 25 MG 24 hr tablet TAKE 1 TABLET BY MOUTH DAILY.  . NONFORMULARY OR COMPOUNDED ITEM Triamcinolone 0.1% & Silvadene cream 1:1- Apply as directed to affected areas as needed  . potassium chloride (KLOR-CON) 10 MEQ tablet Take 2 tablets (20 mEq total) by mouth daily.  . promethazine (PHENERGAN) 25 MG tablet TAKE 1 TABLET BY MOUTH EVERY 6 HOURS AS NEEDED.  [name] spironolactone (ALDACTONE) 25 MG tablet Take 1 tablet (25 mg total) by mouth 2 (two) times daily.  [name] tiZANidine (ZANAFLEX) 4 MG tablet TAKE 1 TABLET BY MOUTH AT BEDTIME.  [name] torsemide (DEMADEX) 20 MG tablet Take 2 tablets (40 mg total) by mouth daily.  [name]  UNABLE TO FIND CPAP: AT bedtime; setting is "12"  . Vitamin D, Ergocalciferol, (DRISDOL) 1.25 MG (50000 UT) CAPS capsule TAKE 1 CAPSULE BY MOUTH EVERY 7 DAYS.     Allergies:  Allergies  Allergen Reactions  . Sulfa Antibiotics Hives and Itching    Flushing also  . Cymbalta [Duloxetine Hcl] Other (See Comments)    Caused a manic reaction  . Demerol Hives and Other (See Comments)    Fever also  . Ivp Dye [Iodinated Diagnostic Agents] Hives and Other (See Comments)    Fever also   . Morphine And Related Other (See Comments)    ineffective  . Sulfasalazine Other (See Comments)    Reaction ??  . Adhesive [Tape] Rash     ROS:  See above HPI for pertinent positives and negatives   Objective:   Blood pressure (!) 144/94, pulse 69, height '5\' 2"'$  (1.575 m), weight 238 lb (108 kg).  (if some vitals are omitted, this means that patient was UNABLE to obtain them even though they were asked to get them prior to OV today.  They were asked to call us at their earliest convenience with these once obtained. )  General: A & O * 3; sounds in no acute distress; in usual state of health.  Skin: Pt confirms warm and dry extremities and pink fingertips HEENT: Pt confirms lips non-cyanotic Chest: Patient confirms normal chest excursion and movement Respiratory: speaking in full sentences, no conversational dyspnea; patient confirms no use of accessory muscles Psych: insight appears good, mood- appears full

## 2019-09-10 ENCOUNTER — Other Ambulatory Visit: Payer: Self-pay

## 2019-09-10 ENCOUNTER — Ambulatory Visit
Admission: RE | Admit: 2019-09-10 | Discharge: 2019-09-10 | Disposition: A | Discharge status: Home / Self Care | Payer: PPO | Source: Ambulatory Visit | Attending: Family Medicine | Admitting: Family Medicine

## 2019-09-10 DIAGNOSIS — Z1231 Encounter for screening mammogram for malignant neoplasm of breast: Secondary | ICD-10-CM

## 2019-09-11 ENCOUNTER — Ambulatory Visit: Payer: PPO

## 2019-09-15 ENCOUNTER — Ambulatory Visit: Payer: PPO | Admitting: Adult Health

## 2019-09-18 ENCOUNTER — Other Ambulatory Visit: Payer: Self-pay | Admitting: Adult Health

## 2019-10-03 ENCOUNTER — Encounter: Payer: Self-pay | Admitting: Family Medicine

## 2019-10-06 ENCOUNTER — Encounter: Payer: Self-pay | Admitting: Family Medicine

## 2019-10-08 DIAGNOSIS — I10 Essential (primary) hypertension: Secondary | ICD-10-CM

## 2019-10-08 DIAGNOSIS — I5032 Chronic diastolic (congestive) heart failure: Secondary | ICD-10-CM

## 2019-10-09 ENCOUNTER — Other Ambulatory Visit: Payer: Self-pay | Admitting: Family Medicine

## 2019-10-09 ENCOUNTER — Other Ambulatory Visit: Payer: Self-pay | Admitting: Rheumatology

## 2019-10-09 ENCOUNTER — Other Ambulatory Visit: Payer: Self-pay | Admitting: Adult Health

## 2019-10-09 DIAGNOSIS — I5032 Chronic diastolic (congestive) heart failure: Secondary | ICD-10-CM

## 2019-10-09 DIAGNOSIS — I10 Essential (primary) hypertension: Secondary | ICD-10-CM

## 2019-10-09 MED ORDER — SPIRONOLACTONE 25 MG PO TABS: 25.0000 mg | ORAL_TABLET | Freq: Two times a day (BID) | ORAL | 1 refills | Status: DC

## 2019-10-09 NOTE — Telephone Encounter (Signed)
Last Visit: 07/14/2019 telemedicine  Next Visit: 10/29/2019 Labs: 08/26/19 glucose 121  Okay to refill per Dr. Estanislado Pandy

## 2019-10-10 NOTE — Telephone Encounter (Signed)
Patient got this medication from Dr. Radford Pax. AS, CMA

## 2019-10-11 ENCOUNTER — Ambulatory Visit: Payer: PPO

## 2019-10-14 ENCOUNTER — Telehealth: Payer: Self-pay

## 2019-10-14 NOTE — Telephone Encounter (Signed)
Pending renewal for Lidocaine 5% patch Key: [id number] Rx #: [id number] PA Case ID: [id number]   I will update once a decision has been made.

## 2019-10-22 ENCOUNTER — Telehealth: Payer: Self-pay | Admitting: *Deleted

## 2019-10-22 NOTE — Progress Notes (Signed)
Office Visit Note  Patient: [name]             Date of Birth: 1951-03-14           MRN: [id number]             PCP: Mellody Dance, DO Referring: Mellody Dance, DO Visit Date: 10/29/2019 Occupation: @GUAROCC @  Subjective:  Medication management   History of Present Illness: [name] is a 69 y.o. female with history of rheumatoid arthritis, osteoarthritis and degenerative disc disease.  She states she continues to have a lot of discomfort in her entire spine.  She is intermittent pain and swelling in her hands.  Overall she is doing quite well on Arava and Plaquenil combination.  Patient is concerned that she may have underlying ankylosing spondylitis that she has chronic lower back pain and SI joint pain.  She would like to have x-rays.  She has been also having discomfort in her right shoulder joint and having difficulty lifting her arm sometimes.  Activities of Daily Living:  Patient reports morning stiffness for 15-20 minutes.   Patient Reports nocturnal pain.  Difficulty dressing/grooming: Denies Difficulty climbing stairs: Reports Difficulty getting out of chair: Reports Difficulty using hands for taps, buttons, cutlery, and/or writing: Reports  Review of Systems  Constitutional: Positive for fatigue. Negative for night sweats, weight gain and weight loss.  HENT: Positive for mouth dryness and nose dryness. Negative for mouth sores, trouble swallowing and trouble swallowing.   Eyes: Positive for dryness. Negative for pain, redness and visual disturbance.  Respiratory: Negative for cough, shortness of breath and difficulty breathing.   Cardiovascular: Negative for chest pain, palpitations, hypertension, irregular heartbeat and swelling in legs/feet.  Gastrointestinal: Positive for diarrhea. Negative for blood in stool and constipation.  Endocrine: Negative for increased urination.  Genitourinary: Negative for difficulty urinating, painful urination and vaginal  dryness.  Musculoskeletal: Positive for arthralgias, joint pain, joint swelling, muscle weakness, morning stiffness and muscle tenderness. Negative for myalgias and myalgias.  Skin: Negative for color change, rash, hair loss, redness, skin tightness, ulcers and sensitivity to sunlight.  Allergic/Immunologic: Negative for susceptible to infections.  Neurological: Positive for numbness. Negative for dizziness, headaches, memory loss, night sweats and weakness.  Hematological: Negative for bruising/bleeding tendency and swollen glands.  Psychiatric/Behavioral: Negative for depressed mood, confusion and sleep disturbance. The patient is not nervous/anxious.     PMFS History:  Patient Active Problem List   Diagnosis Date Noted  . Post-menopausal 09/02/2019  . Pain in left knee 08/23/2018  . Lumbar pain 07/04/2018  . Recurrent urticaria 05/20/2018  . Rhinitis 05/20/2018  . Vitamin D deficiency 11/20/2017  . Mixed hyperlipidemia 11/20/2017  . Family history of coronary arteriosclerosis- strong fam h/o CAD and early CAD.  07/17/2017  . Raynaud's disease without gangrene 03/12/2017  . Abnormal weight gain 02/06/2017  . Shingles outbreak 02/06/2017  . Cyst (solitary) of breast, left 01/31/2017  . Eosinophilic esophagitis 70/26/3785  . History of hyperlipidemia 10/04/2016  . 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 History of diverticulitis 09/03/2016  . Osteoporosis 09/03/2016  . Autoimmune disease ([place]) 09/02/2016  . High risk medication use 09/02/2016  . History of esophagitis 09/02/2016  . Elevated triglycerides with high cholesterol 08/23/2016  . Low serum HDL 08/23/2016  . Breast cyst, left 06/14/2016  . Encounter for wellness examination 05/23/2016  . Abnormality of gait 05/09/2016  . Sjoegren syndrome 02/29/2016  . GERD (gastroesophageal reflux disease) 01/19/2016  . Glucose intolerance (impaired glucose  tolerance) 01/19/2016  . Chronic diastolic CHF (congestive heart  failure) ([place]) 04/20/2015  . Peripheral polyneuropathy 02/02/2014  . Morbid obesity ([place]) 11/17/2013  . Heart murmur   . OSA (obstructive sleep apnea)   . Rheumatoid arthritis ([place])   . Hiatal hernia   . Fibromyalgia   . PVC's (premature ventricular contractions)   . 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 Unilateral primary osteoarthritis, left knee 06/22/2011    Past Medical History:  Diagnosis Date  . Arthritis    Rheumatoid arthritis,   . Blood transfusion    1981  . Chronic diastolic CHF (congestive heart failure) (HCC)    diastolic   . Fibromyalgia   . Heart murmur    as a child  . Hiatal hernia    sjorgens syndrome  . OSA (obstructive sleep apnea)   . Peripheral autonomic neuropathy of unknown cause   . Pneumonia 07/2018  . Urticaria     Family History  Problem Relation Age of Onset  . Alzheimer's disease Mother   . Heart attack Mother   . Hypertension Mother   . Glaucoma Mother   . Cancer Father   . Heart attack Brother   . Heart disease Brother   . Glaucoma Brother   . Hyperlipidemia Brother   . Glaucoma Brother   . Asthma Son   . Allergic rhinitis Neg Hx   . Angioedema Neg Hx   . Eczema Neg Hx   . Urticaria Neg Hx    Past Surgical History:  Procedure Laterality Date  . ABDOMINAL HYSTERECTOMY     BTL, BSO  . ADENOIDECTOMY    . BREAST SURGERY     mass removal   . CHOLECYSTECTOMY    . dental implants    . DIAGNOSTIC LAPAROSCOPY     x3  . KNEE ARTHROSCOPY     x2  . MASS EXCISION Left 03/22/2017   Procedure: EXCISION OF LEFT BREAST MASS;  Surgeon: Donnie Mesa, MD;  Location: [facility];  Service: General;  Laterality: Left;  . patotid cystectomy    . RIGHT/LEFT HEART CATH AND CORONARY ANGIOGRAPHY N/A 07/25/2019   Procedure: RIGHT/LEFT HEART CATH AND CORONARY ANGIOGRAPHY;  Surgeon: Jolaine Artist, MD;  Location: [facility];  Service: Cardiovascular;  Laterality: N/A;  . TENDON REPAIR  1980   left ankle and tibia  .  TONSILLECTOMY    . TOTAL KNEE ARTHROPLASTY  06/21/2011   Procedure: TOTAL KNEE ARTHROPLASTY;  Surgeon: Gearlean Alf;  Location: [facility];  Service: Orthopedics;  Laterality: Right;  . TUBAL LIGATION     Social History   Social History Narrative   Lives at home alone   Right-handed   Drinks 1 or less cups of coffee and 2 servings of either tea or soda per day   Immunization History  Administered Date(s) Administered  . Influenza, High Dose Seasonal PF 06/12/2018, 05/17/2019  . Influenza,inj,Quad PF,6+ Mos 05/23/2016, 06/01/2017  . Influenza-Unspecified 06/05/2018, 05/17/2019  . Pneumococcal Conjugate-13 07/02/2017  . Pneumococcal Polysaccharide-23 05/17/2005  . Td 10/16/2007  . Tdap 03/21/2018  . Zoster Recombinat (Shingrix) 06/10/2019     Objective: Vital Signs: BP 119/78 (BP Location: Left Wrist, Patient Position: Sitting, Cuff Size: Normal)   Pulse 72   Resp 16   Ht 5\' 2"  (1.575 m)   Wt 245 lb (111.1 kg)   BMI 44.81 kg/m    Physical Exam Vitals and nursing note reviewed.  Constitutional:      Appearance: She  is well-developed.  HENT:     Head: Normocephalic and atraumatic.  Eyes:     Conjunctiva/sclera: Conjunctivae normal.  Cardiovascular:     Rate and Rhythm: Normal rate and regular rhythm.     Heart sounds: Normal heart sounds.  Pulmonary:     Effort: Pulmonary effort is normal.     Breath sounds: Normal breath sounds.  Abdominal:     General: Bowel sounds are normal.     Palpations: Abdomen is soft.  Musculoskeletal:     Cervical back: Normal range of motion.  Lymphadenopathy:     Cervical: No cervical adenopathy.  Skin:    General: Skin is warm and dry.     Capillary Refill: Capillary refill takes less than 2 seconds.  Neurological:     Mental Status: She is alert and oriented to person, place, and time.  Psychiatric:        Behavior: Behavior normal.      Musculoskeletal Exam: C-spine was in good range of motion.  She has severe thoracolumbar  scoliosis with limited range of motion.  She has some tenderness over her SI joint area.  She had tenderness over right subacromial region.  Shoulder joint was in full range of motion.  Left shoulder joint was in full range of motion as well.  Elbow joints and wrist joints were in good range of motion.  She had right second MCP thickening but no synovitis was noted.  PIP and DIP thickening was noted.  She has good range of motion of her hip joints and knee joints without any swelling.  No tenderness was noted over MTPs.  CDAI Exam: CDAI Score: 1.8  Patient Global: 5 mm; Provider Global: 3 mm Swollen: 0 ; Tender: 4  Joint Exam 10/29/2019      Right  Left  Glenohumeral   Tender     Lumbar Spine   Tender     Sacroiliac   Tender   Tender     Investigation: No additional findings.  Imaging: XR Lumbar Spine 2-3 Views  Result Date: 10/29/2019 Severe levo scoliosis with osteophytes was noted.  Kissing osteophytes were noted at almost all levels. Impression: These findings are consistent with severe degenerative disc disease and scoliosis.  XR Pelvis 1-2 Views  Result Date: 10/29/2019 No SI joint sclerosis or narrowing was noted. Impression: Unremarkable x-ray of the SI joints.  XR Shoulder Right  Result Date: 10/29/2019 No glenohumeral joint space narrowing was noted.  Mild acromioclavicular arthritis and spurring was noted.  No chondrocalcinosis was noted. Impression: These findings are consistent with acromioclavicular arthritis of the shoulder.   Recent Labs: Lab Results  Component Value Date   WBC 5.3 08/26/2019   HGB 14.4 08/26/2019   PLT 191 08/26/2019   NA 139 08/26/2019   K 3.9 08/26/2019   CL 101 08/26/2019   CO2 22 08/26/2019   GLUCOSE 121 (H) 08/26/2019   BUN 18 08/26/2019   CREATININE 0.82 08/26/2019   BILITOT 0.4 08/26/2019   ALKPHOS 111 08/26/2019   AST 25 08/26/2019   ALT 23 08/26/2019   PROT 6.5 08/26/2019   ALBUMIN 4.1 08/26/2019   CALCIUM 9.2 08/26/2019    GFRAA 86 08/26/2019    Speciality Comments: PLQ Eye Exam:01/29/2019 @ [facility] follow up in 1 year  Procedures:  No procedures performed Allergies: Sulfa antibiotics, Cymbalta [duloxetine hcl], Demerol, Ivp dye [iodinated diagnostic agents], Morphine and related, Sulfasalazine, and Adhesive [tape]   Assessment / Plan:     Visit Diagnoses:  Rheumatoid arthritis involving multiple sites with positive rheumatoid factor (HCC) - Positive RF, positive ANA.  I do not see any synovitis on examination.  She has been on Arava and Plaquenil combination.  Orencia had to be discontinued due to the insurance issues.  High risk medication use - Arava 20 mg daily and Plaquenil 200 mg 1 tablet by mouth twice daily Monday through Friday. D/c IV orencia due to cost. PLQ Eye Exam:01/29/2019.  Her labs are stable.  We will check her labs again in April and then every 3 months to monitor for drug toxicity.  Sjogren's syndrome with keratoconjunctivitis sicca (HCC)-she has been using over-the-counter products which has been helpful.  Raynaud's disease without gangrene-she has intermittent symptoms with the colder weather.  Chronic right shoulder pain -x-ray of the right shoulder.  X-ray of the shoulder joint showed acromioclavicular arthritis.  Unilateral primary osteoarthritis, left knee-chronic pain.  History of total knee replacement, right-doing well.  Chronic SI joint pain -she continues to have bilateral SI joint pain.  Plan: XR Pelvis 1-2 Views.  No SI joint sclerosis or narrowing was noted.  DDD (degenerative disc disease), lumbar -she has severe scoliosis.  She has been having increased lower back pain.  I will obtain x-ray of her lumbar spine today.  X-ray showed severe levoscoliosis and multilevel kissing osteophytes.  She states the pain is severe and she cannot tolerated.  Will refer her to spinal scoliosis center.  I will also call in Robaxin 500 mg p.o. daily as needed total 30 with 2  refills.  Fibromyalgia - Zanaflex 4 mg by mouth at bedtime as needed.  Age-related osteoporosis without current pathological fracture-she had a DEXA scan in January 2021 which was consistent with osteopenia.  Other medical problems are listed as follows:  History of hyperlipidemia  History of diabetes mellitus  History of peripheral neuropathy  Eosinophilic esophagitis  History of diverticulitis  History of depression  History of cardiac murmur  Orders: Orders Placed This Encounter  Procedures  . XR Pelvis 1-2 Views  . XR Lumbar Spine 2-3 Views  . XR Shoulder Right  . Ambulatory referral to Spine Surgery   Meds ordered this encounter  Medications  . methocarbamol (ROBAXIN) 500 MG tablet    Sig: Take 1 tablet (500 mg total) by mouth daily as needed for muscle spasms.    Dispense:  30 tablet    Refill:  0    Face-to-face time spent with patient was 30 minutes. Greater than 50% of time was spent in counseling and coordination of care.  Follow-Up Instructions: Return in about 5 months (around 03/30/2020) for Rheumatoid arthritis.   Bo Merino, MD  Note - This record has been created using Editor, commissioning.  Chart creation errors have been sought, but may not always  have been located. Such creation errors do not reflect on  the standard of medical care.

## 2019-10-22 NOTE — Telephone Encounter (Signed)

## 2019-10-23 ENCOUNTER — Ambulatory Visit: Payer: PPO | Admitting: Cardiology

## 2019-10-23 NOTE — Telephone Encounter (Signed)
Follow Up  Patient is returning call. States that she can not get cpap to download and would like a call back to discuss. Please give patient a call back.

## 2019-10-23 NOTE — Telephone Encounter (Signed)
Returned call to patient: [name] got it. Let me know when they fix it so I can pull the download in my system. Thanks

## 2019-10-27 NOTE — Telephone Encounter (Signed)
Lidocaine patch approved through 10/13/2020.

## 2019-10-29 ENCOUNTER — Ambulatory Visit: Payer: Self-pay

## 2019-10-29 ENCOUNTER — Other Ambulatory Visit: Payer: Self-pay

## 2019-10-29 ENCOUNTER — Ambulatory Visit: Payer: PPO | Admitting: Rheumatology

## 2019-10-29 ENCOUNTER — Encounter: Payer: Self-pay | Admitting: Physician Assistant

## 2019-10-29 VITALS — BP 119/78 | HR 72 | Resp 16 | Ht 62.0 in | Wt 245.0 lb

## 2019-10-29 DIAGNOSIS — I7300 Raynaud's syndrome without gangrene: Secondary | ICD-10-CM | POA: Diagnosis not present

## 2019-10-29 DIAGNOSIS — M797 Fibromyalgia: Secondary | ICD-10-CM

## 2019-10-29 DIAGNOSIS — M25511 Pain in right shoulder: Secondary | ICD-10-CM

## 2019-10-29 DIAGNOSIS — M5136 Other intervertebral disc degeneration, lumbar region: Secondary | ICD-10-CM

## 2019-10-29 DIAGNOSIS — M1712 Unilateral primary osteoarthritis, left knee: Secondary | ICD-10-CM | POA: Diagnosis not present

## 2019-10-29 DIAGNOSIS — Z8719 Personal history of other diseases of the digestive system: Secondary | ICD-10-CM

## 2019-10-29 DIAGNOSIS — M810 Age-related osteoporosis without current pathological fracture: Secondary | ICD-10-CM

## 2019-10-29 DIAGNOSIS — Z8679 Personal history of other diseases of the circulatory system: Secondary | ICD-10-CM

## 2019-10-29 DIAGNOSIS — Z96651 Presence of right artificial knee joint: Secondary | ICD-10-CM | POA: Diagnosis not present

## 2019-10-29 DIAGNOSIS — M3501 Sicca syndrome with keratoconjunctivitis: Secondary | ICD-10-CM

## 2019-10-29 DIAGNOSIS — M0579 Rheumatoid arthritis with rheumatoid factor of multiple sites without organ or systems involvement: Secondary | ICD-10-CM | POA: Diagnosis not present

## 2019-10-29 DIAGNOSIS — Z79899 Other long term (current) drug therapy: Secondary | ICD-10-CM | POA: Diagnosis not present

## 2019-10-29 DIAGNOSIS — M533 Sacrococcygeal disorders, not elsewhere classified: Secondary | ICD-10-CM | POA: Diagnosis not present

## 2019-10-29 DIAGNOSIS — Z8639 Personal history of other endocrine, nutritional and metabolic disease: Secondary | ICD-10-CM | POA: Diagnosis not present

## 2019-10-29 DIAGNOSIS — G8929 Other chronic pain: Secondary | ICD-10-CM

## 2019-10-29 DIAGNOSIS — Z8669 Personal history of other diseases of the nervous system and sense organs: Secondary | ICD-10-CM

## 2019-10-29 DIAGNOSIS — K200 Eosinophilic esophagitis: Secondary | ICD-10-CM

## 2019-10-29 DIAGNOSIS — Z8659 Personal history of other mental and behavioral disorders: Secondary | ICD-10-CM

## 2019-10-29 MED ORDER — METHOCARBAMOL 500 MG PO TABS: 500.0000 mg | ORAL_TABLET | Freq: Every day | ORAL | 0 refills | Status: DC | PRN

## 2019-10-29 NOTE — Patient Instructions (Signed)
Standing Labs We placed an order today for your standing lab work.    Please come back and get your standing labs in April and every 3 months   We have open lab daily Monday through Thursday from 8:30-12:30 PM and 1:30-4:30 PM and Friday from 8:30-12:30 PM and 1:30-4:00 PM at the office of Dr. Bryah [place].   You may experience shorter wait times on Monday and Friday afternoons. The office is located at [address], [address], [place], [place] [address] No appointment is necessary.   Labs are drawn by [facility].  You may receive a bill from [facility] for your lab work.  If you wish to have your labs drawn at another location, please call the office 24 hours in advance to send orders.  If you have any questions regarding directions or hours of operation,  please call [phone].   Just as a reminder please drink plenty of water prior to coming for your lab work. Thanks!   

## 2019-10-30 ENCOUNTER — Other Ambulatory Visit: Payer: Self-pay | Admitting: *Deleted

## 2019-10-30 DIAGNOSIS — G4733 Obstructive sleep apnea (adult) (pediatric): Secondary | ICD-10-CM | POA: Diagnosis not present

## 2019-10-30 NOTE — Telephone Encounter (Signed)
Submitted a Prior Authorization request to [facility] for Methocarbamol via Cover My Meds. Will update once we receive a response.

## 2019-11-04 NOTE — Telephone Encounter (Signed)
Received a fax regarding Prior Authorization from [facility] for [place]. Authorization has been DENIED.

## 2019-11-11 ENCOUNTER — Ambulatory Visit (INDEPENDENT_AMBULATORY_CARE_PROVIDER_SITE_OTHER): Payer: PPO | Admitting: Family Medicine

## 2019-11-11 DIAGNOSIS — K5730 Diverticulosis of large intestine without perforation or abscess without bleeding: Secondary | ICD-10-CM | POA: Diagnosis not present

## 2019-11-11 DIAGNOSIS — K580 Irritable bowel syndrome with diarrhea: Secondary | ICD-10-CM | POA: Diagnosis not present

## 2019-11-12 ENCOUNTER — Other Ambulatory Visit: Payer: Self-pay | Admitting: Family Medicine

## 2019-11-12 ENCOUNTER — Ambulatory Visit: Payer: PPO | Admitting: Adult Health

## 2019-11-12 ENCOUNTER — Encounter: Payer: Self-pay | Admitting: Family Medicine

## 2019-11-12 ENCOUNTER — Other Ambulatory Visit: Payer: Self-pay | Admitting: Cardiology

## 2019-11-12 ENCOUNTER — Other Ambulatory Visit: Payer: Self-pay | Admitting: Rheumatology

## 2019-11-12 DIAGNOSIS — M3501 Sicca syndrome with keratoconjunctivitis: Secondary | ICD-10-CM

## 2019-11-12 DIAGNOSIS — M0579 Rheumatoid arthritis with rheumatoid factor of multiple sites without organ or systems involvement: Secondary | ICD-10-CM

## 2019-11-13 NOTE — Telephone Encounter (Signed)
Last Visit: 10/29/19 Next Visit: 04/01/20 Labs: 08/26/19 Glucose 121 PLQ Eye Exam:01/29/2019   Current dose per office note on 10/29/19: Plaquenil 200 mg 1 tablet by mouth twice daily Monday through Friday  Okay to refill per Dr. Estanislado Pandy

## 2019-12-01 ENCOUNTER — Ambulatory Visit (INDEPENDENT_AMBULATORY_CARE_PROVIDER_SITE_OTHER): Payer: PPO | Admitting: Bariatrics

## 2019-12-01 ENCOUNTER — Other Ambulatory Visit: Payer: Self-pay

## 2019-12-01 ENCOUNTER — Encounter (INDEPENDENT_AMBULATORY_CARE_PROVIDER_SITE_OTHER): Payer: Self-pay | Admitting: Bariatrics

## 2019-12-01 VITALS — BP 103/52 | HR 75 | Temp 98.8°F | Ht 61.0 in | Wt 239.0 lb

## 2019-12-01 DIAGNOSIS — E782 Mixed hyperlipidemia: Secondary | ICD-10-CM

## 2019-12-01 DIAGNOSIS — Z6841 Body Mass Index (BMI) 40.0 and over, adult: Secondary | ICD-10-CM | POA: Diagnosis not present

## 2019-12-01 DIAGNOSIS — R0602 Shortness of breath: Secondary | ICD-10-CM | POA: Diagnosis not present

## 2019-12-01 DIAGNOSIS — I5030 Unspecified diastolic (congestive) heart failure: Secondary | ICD-10-CM | POA: Diagnosis not present

## 2019-12-01 DIAGNOSIS — R5383 Other fatigue: Secondary | ICD-10-CM | POA: Diagnosis not present

## 2019-12-01 DIAGNOSIS — R7303 Prediabetes: Secondary | ICD-10-CM | POA: Diagnosis not present

## 2019-12-01 DIAGNOSIS — G4733 Obstructive sleep apnea (adult) (pediatric): Secondary | ICD-10-CM | POA: Diagnosis not present

## 2019-12-01 DIAGNOSIS — K219 Gastro-esophageal reflux disease without esophagitis: Secondary | ICD-10-CM

## 2019-12-01 DIAGNOSIS — Z1331 Encounter for screening for depression: Secondary | ICD-10-CM

## 2019-12-01 DIAGNOSIS — Z0289 Encounter for other administrative examinations: Secondary | ICD-10-CM

## 2019-12-01 DIAGNOSIS — E559 Vitamin D deficiency, unspecified: Secondary | ICD-10-CM

## 2019-12-02 ENCOUNTER — Encounter: Payer: Self-pay | Admitting: Cardiology

## 2019-12-02 ENCOUNTER — Telehealth: Payer: Self-pay | Admitting: *Deleted

## 2019-12-02 ENCOUNTER — Ambulatory Visit: Payer: PPO | Admitting: Cardiology

## 2019-12-02 ENCOUNTER — Encounter (INDEPENDENT_AMBULATORY_CARE_PROVIDER_SITE_OTHER): Payer: Self-pay | Admitting: Bariatrics

## 2019-12-02 VITALS — BP 110/64 | HR 77 | Ht 61.0 in | Wt 242.6 lb

## 2019-12-02 DIAGNOSIS — I493 Ventricular premature depolarization: Secondary | ICD-10-CM | POA: Diagnosis not present

## 2019-12-02 DIAGNOSIS — G4733 Obstructive sleep apnea (adult) (pediatric): Secondary | ICD-10-CM

## 2019-12-02 DIAGNOSIS — I10 Essential (primary) hypertension: Secondary | ICD-10-CM | POA: Diagnosis not present

## 2019-12-02 DIAGNOSIS — I5032 Chronic diastolic (congestive) heart failure: Secondary | ICD-10-CM | POA: Diagnosis not present

## 2019-12-02 LAB — COMPREHENSIVE METABOLIC PANEL
ALT: 23 IU/L (ref 0–32)
AST: 28 IU/L (ref 0–40)
Albumin/Globulin Ratio: 1.7 (ref 1.2–2.2)
Albumin: 4.1 g/dL (ref 3.8–4.8)
Alkaline Phosphatase: 117 IU/L (ref 39–117)
BUN/Creatinine Ratio: 16 (ref 12–28)
BUN: 15 mg/dL (ref 8–27)
Bilirubin Total: 0.5 mg/dL (ref 0.0–1.2)
CO2: 19 mmol/L — ABNORMAL LOW (ref 20–29)
Calcium: 9.4 mg/dL (ref 8.7–10.3)
Chloride: 101 mmol/L (ref 96–106)
Creatinine, Ser: 0.95 mg/dL (ref 0.57–1.00)
GFR calc Af Amer: 72 mL/min/{1.73_m2} (ref >59)
GFR calc non Af Amer: 62 mL/min/{1.73_m2} (ref >59)
Globulin, Total: 2.4 g/dL (ref 1.5–4.5)
Glucose: 91 mg/dL (ref 65–99)
Potassium: 4 mmol/L (ref 3.5–5.2)
Sodium: 141 mmol/L (ref 134–144)
Total Protein: 6.5 g/dL (ref 6.0–8.5)

## 2019-12-02 LAB — LIPID PANEL WITH LDL/HDL RATIO
Cholesterol, Total: 137 mg/dL (ref 100–199)
HDL: 44 mg/dL (ref >39)
LDL Chol Calc (NIH): 58 mg/dL (ref 0–99)
LDL/HDL Ratio: 1.3 ratio (ref 0.0–3.2)
Triglycerides: 213 mg/dL — ABNORMAL HIGH (ref 0–149)
VLDL Cholesterol Cal: 35 mg/dL (ref 5–40)

## 2019-12-02 LAB — HEMOGLOBIN A1C
Est. average glucose Bld gHb Est-mCnc: 131 mg/dL
Hgb A1c MFr Bld: 6.2 % — ABNORMAL HIGH (ref 4.8–5.6)

## 2019-12-02 LAB — T3: T3, Total: 141 ng/dL (ref 71–180)

## 2019-12-02 LAB — TSH: TSH: 1.55 u[IU]/mL (ref 0.450–4.500)

## 2019-12-02 LAB — VITAMIN D 25 HYDROXY (VIT D DEFICIENCY, FRACTURES): Vit D, 25-Hydroxy: 62.9 ng/mL (ref 30.0–100.0)

## 2019-12-02 LAB — INSULIN, RANDOM: INSULIN: 32.3 u[IU]/mL — ABNORMAL HIGH (ref 2.6–24.9)

## 2019-12-02 LAB — T4, FREE: Free T4: 1.1 ng/dL (ref 0.82–1.77)

## 2019-12-02 NOTE — Telephone Encounter (Signed)
New dme referral made to [facility].

## 2019-12-02 NOTE — Telephone Encounter (Signed)
-----   Message from Antonieta Iba, RN sent at 12/02/2019  1:42 PM EDT ----- Please switch patient from [facility] to [facility].   Thanks!

## 2019-12-02 NOTE — Progress Notes (Signed)
Dear Dr. Mellody Dance,   Thank you for referring [name] to our clinic. The following note includes my evaluation and treatment recommendations.  Chief Complaint:   OBESITY [name] (MR# [id number]) Cooper a 69 y.o. female who presents for evaluation and treatment of obesity and related comorbidities. Current BMI Cooper Body mass index Cooper 45.16 kg/m.[name] [name] has been struggling with her weight for many years and has been unsuccessful in either losing weight, maintaining weight loss, or reaching her healthy weight goal.  [name] Cooper currently in the action stage of change and ready to dedicate time achieving and maintaining a healthier weight. [name] Cooper interested in becoming our patient and working on intensive lifestyle modifications including (but not limited to) diet and exercise for weight loss.  [name] likes to [employer]. She skips breakfast.   [name]'s habits were reviewed today and are as follows: her desired weight loss Cooper 69+ lbs, she has been heavy most of her life, she started gaining weight after the birth of her children at age 24, her heaviest weight ever was 249 pounds, she craves salty foods and Dr. Malachi Bonds, she skips breakfast almost daily, she Cooper frequently drinking liquids with calories, she sometimes makes poor food choices, she sometimes eats larger portions than normal and she struggles with emotional eating.  Depression Screen [name] (modified PHQ-9) score was 6.  Depression screen PHQ 2/9 12/01/2019  Decreased Interest 2  Down, Depressed, Hopeless 1  PHQ - 2 Score 3  Altered sleeping 0  Tired, decreased energy 1  Change in appetite 1  Feeling bad or failure about yourself  0  Trouble concentrating 0  Moving slowly or fidgety/restless 1  Suicidal thoughts 0  PHQ-9 Score 6  Difficult doing work/chores Not difficult at all  Some recent data might be hidden   Subjective:   Other fatigue. [name] denies daytime somnolence. [name] generally gets 7-7.5 hours of  sleep per night, and states that she has generally restful sleep. Snoring Cooper present. Apneic episodes are not present. Epworth Sleepiness Score Cooper 5.  SOB (shortness of breath) on exertion. [name] notes increasing shortness of breath with certain activities and seems to be worsening over time with weight gain. She notes getting out of breath sooner with activity than she used to. This has gotten worse recently. [name] denies shortness of breath at rest or orthopnea.  Diastolic congestive heart failure, unspecified HF chronicity ([name]). [name] Cooper taking Aspirin, Demadex, spironolactone, Toprol XL, and Lipitor.  Marisa (obstructive sleep apnea). [name] Cooper using CPAP and reports fairly restful sleep.  Gastroesophageal reflux disease without esophagitis. [name] Cooper taking Lomotil and Anaspaz.  Mixed hyperlipidemia. [name] Cooper taking Lipitor. Triglycerides elevated at 325 on 08/26/2019 with HDL 46.  Vitamin D deficiency. [name] Cooper taking high dose Vitamin D. Last Vitamin D 61.3 on 08/26/2019.  Prediabetes. [name] has a diagnosis of prediabetes based on her elevated HgA1c and was informed this puts her at greater risk of developing diabetes. She continues to work on diet and exercise to decrease her risk of diabetes. She denies nausea or hypoglycemia. [name] reports low appetite. Last A1c 6.1 on 08/26/2019.  Depression screening. [name] had a mildly positive depression screen with a PHQ-9 score of 6.  Assessment/Plan:   Other fatigue. [name] does feel that her weight Cooper causing her energy to be lower than it should be. Fatigue may be related to obesity, depression or many other causes. Labs will be ordered, and in the meanwhile,  [name] will focus on self care including making healthy food choices, increasing physical activity and focusing on stress reduction. EKG 12-Lead, T3, T4, free, TSH testing ordered today.  SOB (shortness of breath) on exertion. [name] does feel that she gets out of breath more easily that she used to when  she exercises. [name]'s shortness of breath appears to be obesity related and exercise induced. She has agreed to work on weight loss and gradually increase exercise to treat her exercise induced shortness of breath. Will continue to monitor closely. T3, T4, free, TSH labs ordered today.  Diastolic congestive heart failure, unspecified HF chronicity ([name]). [name] will follow-up with her PCP and with Cardiology and continue her medications as directed. Comprehensive metabolic panel ordered today.  Marisa (obstructive sleep apnea). Intensive lifestyle modifications are the first line treatment for this issue. We discussed several lifestyle modifications today and she will continue to work on diet, exercise and weight loss efforts. We will continue to monitor. Orders and follow up as documented in patient record. [name] will wear CPAP nightly.  Counseling  Sleep apnea Cooper a condition in which breathing pauses or becomes shallow during sleep. This happens over and over during the night. This disrupts your sleep and keeps your body from getting the rest that it needs, which can cause tiredness and lack of energy (fatigue) during the day.  Sleep apnea treatment: If you were given a device to open your airway while you sleep, USE IT!  Sleep hygiene:   Limit or avoid alcohol, caffeinated beverages, and cigarettes, especially close to bedtime.   Do not eat a large meal or eat spicy foods right before bedtime. This can lead to digestive discomfort that can make it hard for you to sleep.  Keep a sleep diary to help you and your health care provider figure out what could be causing your insomnia.  . Make your bedroom a dark, comfortable place where it Cooper easy to fall asleep. ? Put up shades or blackout curtains to block light from outside. ? Use a white noise machine to block noise. ? Keep the temperature cool. . Limit screen use before bedtime. This includes: ? Watching TV. ? Using your smartphone, tablet, or  computer. . Stick to a routine that includes going to bed and waking up at the same times every day and night. This can help you fall asleep faster. Consider making a quiet activity, such as reading, part of your nighttime routine. . Try to avoid taking naps during the day so that you sleep better at night. . Get out of bed if you are still awake after 15 minutes of trying to sleep. Keep the lights down, but try reading or doing a quiet activity. When you feel sleepy, go back to bed.  Gastroesophageal reflux disease without esophagitis. Intensive lifestyle modifications are the first line treatment for this issue. We discussed several lifestyle modifications today and she will continue to work on diet, exercise and weight loss efforts. Orders and follow up as documented in patient record. Sirenity will continue her medications as directed.   Counseling . If a person has gastroesophageal reflux disease (GERD), food and stomach acid move back up into the esophagus and cause symptoms or problems such as damage to the esophagus. . Anti-reflux measures include: raising the head of the bed, avoiding tight clothing or belts, avoiding eating late at night, not lying down shortly after mealtime, and achieving weight loss. . Avoid ASA, NSAID's, caffeine, alcohol, and tobacco.  .  OTC Pepcid and/or Tums are often very helpful for as needed use.  [name] However, for persisting chronic or daily symptoms, stronger medications like Omeprazole may be needed. . You may need to avoid foods and drinks such as: ? Coffee and tea (with or without caffeine). ? Drinks that contain alcohol. ? Energy drinks and sports drinks. ? Bubbly (carbonated) drinks or sodas. ? Chocolate and cocoa. ? Peppermint and mint flavorings. ? Garlic and onions. ? Horseradish. ? Spicy and acidic foods. These include peppers, chili powder, curry powder, vinegar, hot sauces, and BBQ sauce. ? Citrus fruit juices and citrus fruits, such as oranges,  lemons, and limes. ? Tomato-based foods. These include red sauce, chili, salsa, and pizza with red sauce. ? Fried and fatty foods. These include donuts, french fries, potato chips, and high-fat dressings. ? High-fat meats. These include hot dogs, rib eye steak, sausage, ham, and bacon.  Mixed hyperlipidemia. Cardiovascular risk and specific lipid/LDL goals reviewed.  We discussed several lifestyle modifications today and Kyann will continue to work on diet, exercise and weight loss efforts. Orders and follow up as documented in patient record. Lipid Panel With LDL/HDL Ratio labs ordered today.  Counseling Intensive lifestyle modifications are the first line treatment for this issue. . Dietary changes: Increase soluble fiber. Decrease simple carbohydrates. . Exercise changes: Moderate to vigorous-intensity aerobic activity 150 minutes per week if tolerated. . Lipid-lowering medications: see documented in medical record.     Vitamin D deficiency. Low Vitamin D level contributes to fatigue and are associated with obesity, breast, and colon cancer. VITAMIN D 25 Hydroxy (Vit-D Deficiency, Fractures) level ordered today.  Prediabetes. Arturo will continue to work on weight loss, exercise, and decreasing simple carbohydrates to help decrease the risk of diabetes. Hemoglobin A1c, Insulin, random labs ordered.  Depression screening. Tayjah had a positive depression screening. Depression Cooper commonly associated with obesity and often results in emotional eating behaviors. We will monitor this closely and work on CBT to help improve the non-hunger eating patterns. Referral to Psychology may be required if no improvement Cooper seen as she continues in our clinic.  Class 3 severe obesity with serious comorbidity and body mass index (BMI) of 45.0 to 49.9 in adult, unspecified obesity type ([place]).  Tyarra Cooper currently in the action stage of change and her goal Cooper to continue with weight loss efforts. I recommend Daana  begin the structured treatment plan as follows:  She has agreed to the Category 3 Plan.  She will work on meal planning.  We independently reviewed with the patient labs from 08/26/2019 including CMP, lipids, Vitamin D, and CBC.  Exercise goals: Older adults should follow the adult guidelines. When older adults cannot meet the adult guidelines, they should be as physically active as their abilities and conditions will allow.    Behavioral modification strategies: increasing lean protein intake, decreasing simple carbohydrates, increasing vegetables, increasing water intake, decreasing eating out, no skipping meals, meal planning and cooking strategies, keeping healthy foods in the home and planning for success.  She was informed of the importance of frequent follow-up visits to maximize her success with intensive lifestyle modifications for her multiple health conditions. She was informed we would discuss her lab results at her next visit unless there Cooper a critical issue that needs to be addressed sooner. Lakiah agreed to keep her next visit at the agreed upon time to discuss these results.  Objective:   Blood pressure (!) 103/52, pulse 75, temperature 98.8 F (37.1 C), temperature source  Oral, height 5\' 1"  (1.549 m), weight 239 lb (108.4 kg), SpO2 96 %. Body mass index Cooper 45.16 kg/m.  EKG: Sinus  Rhythm with a rate of 76 BPM. RSR(V1) - nondiagnostic. Probably normal.   Indirect Calorimeter completed today shows a VO2 of 281 and a REE of 1957.  Her calculated basal metabolic rate Cooper 3338 thus her basal metabolic rate Cooper better than expected.  General: Cooperative, alert, well developed, in no acute distress. HEENT: Conjunctivae and lids unremarkable. Cardiovascular: Regular rhythm.  Lungs: Normal work of breathing. Neurologic: No focal deficits.   Lab Results  Component Value Date   CREATININE 0.95 12/01/2019   BUN 15 12/01/2019   NA 141 12/01/2019   K 4.0 12/01/2019   CL 101  12/01/2019   CO2 19 (L) 12/01/2019   Lab Results  Component Value Date   ALT 23 12/01/2019   AST 28 12/01/2019   ALKPHOS 117 12/01/2019   BILITOT 0.5 12/01/2019   Lab Results  Component Value Date   HGBA1C 6.2 (H) 12/01/2019   HGBA1C 6.1 (H) 08/26/2019   HGBA1C 5.6 08/20/2018   HGBA1C 5.4 03/21/2018   HGBA1C 6.1 (H) 11/26/2017   Lab Results  Component Value Date   INSULIN 32.3 (H) 12/01/2019   Lab Results  Component Value Date   TSH 1.550 12/01/2019   Lab Results  Component Value Date   CHOL 137 12/01/2019   HDL 44 12/01/2019   LDLCALC 58 12/01/2019   TRIG 213 (H) 12/01/2019   CHOLHDL 3.5 08/26/2019   Lab Results  Component Value Date   WBC 5.3 08/26/2019   HGB 14.4 08/26/2019   HCT 43.3 08/26/2019   MCV 87 08/26/2019   PLT 191 08/26/2019   No results found for: IRON, TIBC, FERRITIN  Obesity Behavioral Intervention Visit Documentation for Insurance:   Approximately 15 minutes were spent on the discussion below.  ASK: We discussed the diagnosis of obesity with [name] today and Madeline agreed to give [place] permission to discuss obesity behavioral modification therapy today.  ASSESS: Betsi has the diagnosis of obesity and her BMI today Cooper 45.2. Gulianna Cooper in the action stage of change.   ADVISE: Phallon was educated on the multiple health risks of obesity as well as the benefit of weight loss to improve her health. She was advised of the need for long term treatment and the importance of lifestyle modifications to improve her current health and to decrease her risk of future health problems.  AGREE: Multiple dietary modification options and treatment options were discussed and Mikena agreed to follow the recommendations documented in the above note.  ARRANGE: Zaida was educated on the importance of frequent visits to treat obesity as outlined per CMS and USPSTF guidelines and agreed to schedule her next follow up appointment today.  Attestation Statements:   Reviewed by  clinician on day of visit: allergies, medications, problem list, medical history, surgical history, family history, social history, and previous encounter notes.  Migdalia Dk, am acting as Location manager for [employer], DO   I have reviewed the above documentation for accuracy and completeness, and I agree with the above. Jearld Lesch, DO

## 2019-12-02 NOTE — Progress Notes (Signed)
Cardiology Office Note:    Date:  12/02/2019   ID:  [name], DOB [birth year], MRN [id number]  PCP:  Marisa Dance, DO  Cardiologist:  Marisa Him, MD    Referring MD: Marisa Dance, DO   Chief Complaint  Patient presents with  . Congestive Heart Failure  . Sleep Apnea    History of Present Illness:    [name] is a 69 y.o. female with a hx of Chronic diastolic CHF, normal coronary arteries with normal filling pressures in 07/2019, chronic DOE related to obesity and sedentary state (evaluated in AHF clinic and felt that sx were related to this and now so much CHF), morbid obesity and OSA.    She is here today for followup and is doing well.  She continues to have chronic DOE which is unchanged. She denies any chest pain or pressure,  PND, orthopnea, LE edema, dizziness, palpitations or syncope. She is compliant with her meds and is tolerating meds with no SE.  She is doing well with her CPAP device and thinks that she has gotten used to it.  SHe tolerates the mask and feels the pressure is adequate.  Since going on CPAP she feels rested in the am and has no significant daytime sleepiness.  She denies any significant mouth or nasal dryness or nasal congestion.  She does not think that he snores.     Past Medical History:  Diagnosis Date  . Arthritis    Rheumatoid arthritis,   . Back pain   . Blood transfusion    1981  . CHF (congestive heart failure) ([place])   . Chronic diastolic CHF (congestive heart failure) ([facility])    diastolic   . DDD (degenerative disc disease), cervical   . DDD (degenerative disc disease), lumbar   . Edema of both lower extremities   . Fibromyalgia   . Gallbladder problem   . GERD (gastroesophageal reflux disease)   . Heart murmur    as a child  . Hiatal hernia    sjorgens syndrome  . High blood pressure   . High cholesterol   . IBS (irritable bowel syndrome)   . Joint pain   . Numbness and tingling of both feet   . Numbness of  fingers   . OSA (obstructive sleep apnea)   . Osteoarthritis   . Peripheral autonomic neuropathy of unknown cause   . Pneumonia 07/2018  . Polyneuropathy   . Pre-diabetes   . PVC (premature ventricular contraction)   . Raynaud disease   . Rheumatoid arthritis ([place])   . Sjogren's disease ([place])   . Sleep apnea   . SOB (shortness of breath)   . Swallowing difficulty   . Urticaria   . Vitamin D deficiency     Past Surgical History:  Procedure Laterality Date  . ABDOMINAL HYSTERECTOMY     BTL, BSO  . ADENOIDECTOMY    . BREAST SURGERY     mass removal   . CHOLECYSTECTOMY    . dental implants    . DIAGNOSTIC LAPAROSCOPY     x3  . KNEE ARTHROSCOPY     x2  . MASS EXCISION Left 03/22/2017   Procedure: EXCISION OF LEFT BREAST MASS;  Surgeon: Donnie Mesa, MD;  Location: [facility];  Service: General;  Laterality: Left;  . patotid cystectomy    . RIGHT/LEFT HEART CATH AND CORONARY ANGIOGRAPHY N/A 07/25/2019   Procedure: RIGHT/LEFT HEART CATH AND CORONARY ANGIOGRAPHY;  Surgeon: Jolaine Artist, MD;  Location:  [facility];  Service: Cardiovascular;  Laterality: N/A;  . TENDON REPAIR  1980   left ankle and tibia  . TONSILLECTOMY    . TOTAL KNEE ARTHROPLASTY  06/21/2011   Procedure: TOTAL KNEE ARTHROPLASTY;  Surgeon: Gearlean Alf;  Location: [facility];  Service: Orthopedics;  Laterality: Right;  . TUBAL LIGATION      Current Medications: Current Meds  Medication Sig  . aspirin EC 81 MG tablet Take 81 mg by mouth at bedtime.  [name] atorvastatin (LIPITOR) 20 MG tablet TAKE 1 TABLET BY MOUTH DAILY.  . Calcium Carbonate-Vit D-Min (GNP CALCIUM 1200) 1200-1000 MG-UNIT CHEW Calcium 1200mg  qd OTC  . cycloSPORINE (RESTASIS) 0.05 % ophthalmic emulsion Place 2 drops into both eyes 2 (two) times daily.   . diphenoxylate-atropine (LOMOTIL) 2.5-0.025 MG per tablet Take 1 tablet by mouth 4 (four) times daily as needed for diarrhea or loose stools.  . fluticasone (FLONASE) 50 MCG/ACT nasal  spray Use 1 spray per nostril 1-2 times daily as needed  . gabapentin (NEURONTIN) 300 MG capsule TAKE 2 CAPSULES IN THE MORNING AND 3 CAPSULES AT BEDTIME  . hydroxychloroquine (PLAQUENIL) 200 MG tablet TAKE 1 TABLET BY MOUTH TWICE A DAY MONDAY THROUGH FRIDAY.  . hyoscyamine (ANASPAZ) 0.125 MG TBDP disintergrating tablet Place 0.25 mg under the tongue every 6 (six) hours as needed for cramping.   . leflunomide (ARAVA) 20 MG tablet TAKE 1 TABLET BY MOUTH DAILY.  [name] levocetirizine (XYZAL) 5 MG tablet TAKE 1 TABLET BY MOUTH EVERY EVENING.  [name] lidocaine (LIDODERM) 5 % PLACE 1 PATCH ONTO THE SKIN DAILY AS NEEDED FOR PAIN. REMOVE AND DISCARD PATCH WITHIN 12 HOURS OR AS DIRECTED BY MD  . methocarbamol (ROBAXIN) 500 MG tablet Take 1 tablet (500 mg total) by mouth daily as needed for muscle spasms.  . metoprolol succinate (TOPROL-XL) 25 MG 24 hr tablet Take 1 tablet (25 mg total) by mouth daily. Please keep upcoming appt with Dr. Radford Pax in April for future refills. Thank you  . NONFORMULARY OR COMPOUNDED ITEM Triamcinolone 0.1% & Silvadene cream 1:1- Apply as directed to affected areas as needed  . potassium chloride (KLOR-CON) 10 MEQ tablet Take 2 tablets (20 mEq total) by mouth daily.  . promethazine (PHENERGAN) 25 MG tablet TAKE 1 TABLET BY MOUTH EVERY 6 HOURS AS NEEDED.  [name] spironolactone (ALDACTONE) 25 MG tablet Take 1 tablet (25 mg total) by mouth 2 (two) times daily.  [name] tiZANidine (ZANAFLEX) 4 MG tablet TAKE 1 TABLET BY MOUTH AT BEDTIME.  [name] torsemide (DEMADEX) 20 MG tablet Take 2 tablets (40 mg total) by mouth daily.  [name] UNABLE TO FIND CPAP: AT bedtime; setting is "12"  . Vitamin D, Ergocalciferol, (DRISDOL) 1.25 MG (50000 UNIT) CAPS capsule TAKE 1 CAPSULE BY MOUTH EVERY 7 DAYS.     Allergies:   Sulfa antibiotics, Cymbalta [duloxetine hcl], Demerol, Ivp dye [iodinated diagnostic agents], Morphine and related, Sulfasalazine, and Adhesive [tape]   Social History   Socioeconomic History  . Marital  status: Widowed    Spouse name: Not on file  . Number of children: 2  . Years of education: AS  . Highest education level: Not on file  Occupational History  . Occupation: retired Marine scientist  . Occupation: Therapist, sports  Tobacco Use  . Smoking status: Never Smoker  . Smokeless tobacco: Never Used  Substance and Sexual Activity  . Alcohol use: Yes    Comment: socially   . Drug use: No  . Sexual activity: Never  Other Topics Concern  . Not on file  Social History Narrative   Lives at home alone   Right-handed   Drinks 1 or less cups of coffee and 2 servings of either tea or soda per day   Social Determinants of Health   Financial Resource Strain:   . Difficulty of Paying Living Expenses:   Food Insecurity:   . Worried About Charity fundraiser in the Last Year:   . Arboriculturist in the Last Year:   Transportation Needs:   . Film/video editor (Medical):   [name] Lack of Transportation (Non-Medical):   Physical Activity:   . Days of Exercise per Week:   . Minutes of Exercise per Session:   Stress:   . Feeling of Stress :   Social Connections:   . Frequency of Communication with Friends and Family:   . Frequency of Social Gatherings with Friends and Family:   . Attends Religious Services:   . Active Member of Clubs or Organizations:   . Attends Archivist Meetings:   [name] Marital Status:      Family History: The patient's family history includes Alzheimer's disease in her mother; Asthma in her son; Cancer in her father; Depression in her mother; Glaucoma in her brother, brother, and mother; Heart attack in her brother and mother; Heart disease in her brother, father, and mother; High Cholesterol in her father and mother; Hyperlipidemia in her brother; Hypertension in her mother; Sleep apnea in her father. There is no history of Allergic rhinitis, Angioedema, Eczema, or Urticaria.  ROS:   Please see the history of present illness.    ROS  All other systems reviewed and  negative.   EKGs/Labs/Other Studies Reviewed:    The following studies were reviewed today: [facility]  EKG:  EKG is  ordered today and showed NSR with PVCs  Recent Labs: 08/26/2019: Hemoglobin 14.4; Platelets 191 12/01/2019: ALT 23; BUN 15; Creatinine, Ser 0.95; Potassium 4.0; Sodium 141; TSH 1.550   Recent Lipid Panel    Component Value Date/Time   CHOL 137 12/01/2019 1418   TRIG 213 (H) 12/01/2019 1418   HDL 44 12/01/2019 1418   CHOLHDL 3.5 08/26/2019 1048   CHOLHDL 4.7 08/11/2016 1017   VLDL 59 (H) 08/11/2016 1017   LDLCALC 58 12/01/2019 1418    Physical Exam:    VS:  BP 110/64   Pulse 77   Ht 5\' 1"  (1.549 m)   Wt 242 lb 9.6 oz (110 kg)   SpO2 95%   BMI 45.84 kg/m     Wt Readings from Last 3 Encounters:  12/02/19 242 lb 9.6 oz (110 kg)  12/01/19 239 lb (108.4 kg)  10/29/19 245 lb (111.1 kg)     GEN:  Well nourished, well developed in no acute distress HEENT: Normal NECK: No JVD; No carotid bruits LYMPHATICS: No lymphadenopathy CARDIAC: RRR, no murmurs, rubs, gallops RESPIRATORY:  Clear to auscultation without rales, wheezing or rhonchi  ABDOMEN: Soft, non-tender, non-distended MUSCULOSKELETAL:  No edema; No deformity  SKIN: Warm and dry NEUROLOGIC:  Alert and oriented x 3 PSYCHIATRIC:  Normal affect   ASSESSMENT:    1. OSA (obstructive sleep apnea)   2. Morbid obesity, unspecified obesity type ([place])   3. Chronic diastolic CHF (congestive heart failure) ([place])   4. Hypertension, unspecified type    PLAN:    In order of problems listed above:  1.  OSA -  The patient is tolerating  PAP therapy well without any problems. The PAP download was reviewed today and showed an AHI of 3.3/hr on 12 cm H2O with 67% compliance in using more than 4 hours nightly.  The patient has been using and benefiting from PAP use and will continue to benefit from therapy. I encouraged her to be more compliant with her device.   2.  Morbid Obesity -I have  encouraged her to get into a routine exercise program and cut back on carbs and portions.  -she is joining the weight [facility] program at [facility]  3.  Chronic diastolic CHF -evaluated in AHF clinic and felt that chronic SOB related to morbid obesity and sedentary state and recommended a goal of weight loss as her high school weight + 15lbs for her dry weight.   -normal coronary arteries and filling pressures on cath 07/2019 -she does not appear volume overloaded on exam today -continue Torsemide 40mg  daily and Spiro 25mg  BID -creatinine was stable at 0.95 and K+ 4 yesterday  4.  HTN -BP controlled -continue Arlyce Harman 25mg  daily, Toprol XL 25mg  daily   Medication Adjustments/Labs and Tests Ordered: Current medicines are reviewed at length with the patient today.  Concerns regarding medicines are outlined above.  No orders of the defined types were placed in this encounter.  No orders of the defined types were placed in this encounter.   Signed, Marisa Him, MD  12/02/2019 1:21 PM    [facility]

## 2019-12-02 NOTE — Patient Instructions (Signed)
Medication Instructions:  Your physician recommends that you continue on your current medications as directed. Please refer to the Current Medication list given to you today.  *If you need a refill on your cardiac medications before your next appointment, please call your pharmacy*   Follow-Up: At [facility], you and your health needs are our priority.  As part of our continuing mission to provide you with exceptional heart care, we have created designated Provider Care Teams.  These Care Teams include your primary Cardiologist (physician) and Advanced Practice Providers (APPs -  Physician Assistants and Nurse Practitioners) who all work together to provide you with the care you need, when you need it.  Your next appointment:   1 year(s)  The format for your next appointment:   Either In Person or Virtual  Provider:   Traci Turner, MD    

## 2019-12-11 DIAGNOSIS — M419 Scoliosis, unspecified: Secondary | ICD-10-CM | POA: Diagnosis not present

## 2019-12-11 DIAGNOSIS — M4156 Other secondary scoliosis, lumbar region: Secondary | ICD-10-CM | POA: Diagnosis not present

## 2019-12-12 ENCOUNTER — Encounter: Payer: Self-pay | Admitting: Family Medicine

## 2019-12-15 ENCOUNTER — Ambulatory Visit (INDEPENDENT_AMBULATORY_CARE_PROVIDER_SITE_OTHER): Payer: PPO | Admitting: Bariatrics

## 2019-12-16 ENCOUNTER — Ambulatory Visit (INDEPENDENT_AMBULATORY_CARE_PROVIDER_SITE_OTHER): Payer: PPO | Admitting: Family Medicine

## 2019-12-16 ENCOUNTER — Encounter (INDEPENDENT_AMBULATORY_CARE_PROVIDER_SITE_OTHER): Payer: Self-pay | Admitting: Family Medicine

## 2019-12-16 ENCOUNTER — Other Ambulatory Visit: Payer: Self-pay

## 2019-12-16 VITALS — BP 129/81 | HR 78 | Temp 98.7°F | Ht 61.0 in | Wt 228.0 lb

## 2019-12-16 DIAGNOSIS — R7303 Prediabetes: Secondary | ICD-10-CM | POA: Diagnosis not present

## 2019-12-16 DIAGNOSIS — E7849 Other hyperlipidemia: Secondary | ICD-10-CM

## 2019-12-16 DIAGNOSIS — E559 Vitamin D deficiency, unspecified: Secondary | ICD-10-CM

## 2019-12-16 DIAGNOSIS — Z6841 Body Mass Index (BMI) 40.0 and over, adult: Secondary | ICD-10-CM | POA: Diagnosis not present

## 2019-12-16 NOTE — Progress Notes (Signed)
Chief Complaint:   OBESITY [name] is here to discuss her progress with her obesity treatment plan along with follow-up of her obesity related diagnoses. [name] is on the Category 3 Plan and states she is following her eating plan approximately 95% of the time. [name] states she is walking more.  Today's visit was #: 2 Starting weight: 239 lbs Starting date: 12/01/2019 Today's weight: 228 lbs Today's date: 12/16/2019 Total lbs lost to date: 11 lbs Total lbs lost since last in-office visit: 11 lbs  Interim History: [name] reports that it is hard to eat everything.  She says she eats eggs sometimes but not everyday (alternating between options).  She has no appetite.  She is able to eat everything at lunch.  At dinner, she is able to eat the whole amount of protein.  For snacks, she is having peanut butter and celery, yogurt, flatbread crackers with Laughing Cow cheese.  Subjective:   1. Prediabetes [name] has a diagnosis of prediabetes based on her elevated HgA1c and was informed this puts her at greater risk of developing diabetes. She continues to work on diet and exercise to decrease her risk of diabetes. She denies nausea or hypoglycemia.  She is not on metformin but she has very significant GI distress and IBS with significant diarrhea, so metformin is not a possible medication.  Lab Results  Component Value Date   HGBA1C 6.2 (H) 12/01/2019   Lab Results  Component Value Date   INSULIN 32.3 (H) 12/01/2019   2. Vitamin D deficiency [name]'s Vitamin D level was 62.9 on 12/01/2019. She is currently taking prescription vitamin D 50,000 IU each week. She denies nausea, vomiting or muscle weakness.  She endorses fatigue.  3. Other hyperlipidemia [name] has hyperlipidemia and has been trying to improve her cholesterol levels with intensive lifestyle modification including a low saturated fat diet, exercise and weight loss. She denies any chest pain, claudication or myalgias.  LDL is well-controled.   She is taking atorvastatin.  Lab Results  Component Value Date   ALT 23 12/01/2019   AST 28 12/01/2019   ALKPHOS 117 12/01/2019   BILITOT 0.5 12/01/2019   Lab Results  Component Value Date   CHOL 137 12/01/2019   HDL 44 12/01/2019   LDLCALC 58 12/01/2019   TRIG 213 (H) 12/01/2019   CHOLHDL 3.5 08/26/2019   Assessment/Plan:   1. Prediabetes [name] will continue to work on weight loss, exercise, and decreasing simple carbohydrates to help decrease the risk of diabetes.   2. Vitamin D deficiency Low Vitamin D level contributes to fatigue and are associated with obesity, breast, and colon cancer. She agrees to continue to take prescription Vitamin D @50 ,000 IU every week and will follow-up for routine testing of Vitamin D, at least 2-3 times per year to avoid over-replacement.  3. Other hyperlipidemia Cardiovascular risk and specific lipid/LDL goals reviewed.  We discussed several lifestyle modifications today and [name] will continue to work on diet, exercise and weight loss efforts. Orders and follow up as documented in patient record.   Counseling Intensive lifestyle modifications are the first line treatment for this issue. . Dietary changes: Increase soluble fiber. Decrease simple carbohydrates. . Exercise changes: Moderate to vigorous-intensity aerobic activity 150 minutes per week if tolerated. . Lipid-lowering medications: see documented in medical record.  4. Class 3 severe obesity with serious comorbidity and body mass index (BMI) of 40.0 to 44.9 in adult, unspecified obesity type (HCC) [name] is currently in the action stage  of change. As such, her goal is to continue with weight loss efforts. She has agreed to the Category 3 Plan.   Exercise goals: No exercise has been prescribed at this time.  Behavioral modification strategies: increasing lean protein intake, meal planning and cooking strategies, keeping healthy foods in the home and planning for success.  [name] has  agreed to follow-up with our clinic in 2 weeks. She was informed of the importance of frequent follow-up visits to maximize her success with intensive lifestyle modifications for her multiple health conditions.   Objective:   Blood pressure 129/81, pulse 78, temperature 98.7 F (37.1 C), temperature source Oral, height 5\' 1"  (1.549 m), weight 228 lb (103.4 kg), SpO2 92 %. Body mass index is 43.08 kg/m.  General: Cooperative, alert, well developed, in no acute distress. HEENT: Conjunctivae and lids unremarkable. Cardiovascular: Regular rhythm.  Lungs: Normal work of breathing. Neurologic: No focal deficits.   Lab Results  Component Value Date   CREATININE 0.95 12/01/2019   BUN 15 12/01/2019   NA 141 12/01/2019   K 4.0 12/01/2019   CL 101 12/01/2019   CO2 19 (L) 12/01/2019   Lab Results  Component Value Date   ALT 23 12/01/2019   AST 28 12/01/2019   ALKPHOS 117 12/01/2019   BILITOT 0.5 12/01/2019   Lab Results  Component Value Date   HGBA1C 6.2 (H) 12/01/2019   HGBA1C 6.1 (H) 08/26/2019   HGBA1C 5.6 08/20/2018   HGBA1C 5.4 03/21/2018   HGBA1C 6.1 (H) 11/26/2017   Lab Results  Component Value Date   INSULIN 32.3 (H) 12/01/2019   Lab Results  Component Value Date   TSH 1.550 12/01/2019   Lab Results  Component Value Date   CHOL 137 12/01/2019   HDL 44 12/01/2019   LDLCALC 58 12/01/2019   TRIG 213 (H) 12/01/2019   CHOLHDL 3.5 08/26/2019   Lab Results  Component Value Date   WBC 5.3 08/26/2019   HGB 14.4 08/26/2019   HCT 43.3 08/26/2019   MCV 87 08/26/2019   PLT 191 08/26/2019   Obesity Behavioral Intervention Documentation for Insurance:   Approximately 15 minutes were spent on the discussion below.  ASK: We discussed the diagnosis of obesity with [name] today and [name] agreed to give [place] permission to discuss obesity behavioral modification therapy today.  ASSESS: [name] has the diagnosis of obesity and her BMI today is 43.2. [name] is in the action stage of  change.   ADVISE: [name] was educated on the multiple health risks of obesity as well as the benefit of weight loss to improve her health. She was advised of the need for long term treatment and the importance of lifestyle modifications to improve her current health and to decrease her risk of future health problems.  AGREE: Multiple dietary modification options and treatment options were discussed and [name] agreed to follow the recommendations documented in the above note.  ARRANGE: [name] was educated on the importance of frequent visits to treat obesity as outlined per CMS and USPSTF guidelines and agreed to schedule her next follow up appointment today.  Attestation Statements:   Reviewed by clinician on day of visit: allergies, medications, problem list, medical history, surgical history, family history, social history, and previous encounter notes.  I, Water quality scientist, CMA, am acting as transcriptionist for Gypsy Balsam, MD.  I have reviewed the above documentation for accuracy and completeness, and I agree with the above. - Jinny Blossom, MD

## 2019-12-29 ENCOUNTER — Ambulatory Visit (INDEPENDENT_AMBULATORY_CARE_PROVIDER_SITE_OTHER): Payer: PPO | Admitting: Family Medicine

## 2019-12-29 ENCOUNTER — Encounter (INDEPENDENT_AMBULATORY_CARE_PROVIDER_SITE_OTHER): Payer: Self-pay | Admitting: Family Medicine

## 2019-12-29 ENCOUNTER — Other Ambulatory Visit: Payer: Self-pay

## 2019-12-29 VITALS — BP 128/74 | HR 59 | Temp 98.2°F | Ht 61.0 in | Wt 226.0 lb

## 2019-12-29 DIAGNOSIS — E559 Vitamin D deficiency, unspecified: Secondary | ICD-10-CM | POA: Diagnosis not present

## 2019-12-29 DIAGNOSIS — Z6841 Body Mass Index (BMI) 40.0 and over, adult: Secondary | ICD-10-CM | POA: Diagnosis not present

## 2019-12-29 DIAGNOSIS — R7303 Prediabetes: Secondary | ICD-10-CM

## 2019-12-29 MED ORDER — VITAMIN D (ERGOCALCIFEROL) 1.25 MG (50000 UNIT) PO CAPS: ORAL_CAPSULE | ORAL | 0 refills | Status: DC

## 2019-12-29 NOTE — Progress Notes (Signed)
Chief Complaint:   OBESITY Phiona is here to discuss her progress with her obesity treatment plan along with follow-up of her obesity related diagnoses. Shirlyn is on the Category 3 Plan and states she is following her eating plan approximately 80-90% of the time. Izabelle states she is exercising for 0 minutes 0 times per week.  Today's visit was #: 3 Starting weight: 239 lbs Starting date: 12/01/2019 Today's weight: 226 lbs Today's date: 12/29/2019 Total lbs lost to date: 13 lbs Total lbs lost since last in-office visit: 2 lbs  Interim History: Jaye says she is not experiencing hunger.  She is still noting some difficulty eating all the food.  No difficulty with protein.  She has been reversing dinner and lunch.  She says she has no plans for the next few weeks.  She is going to [facility] the second week of June.  Subjective:   1. Vitamin D deficiency Grayce's Vitamin D level was 62.9 on 12/01/2019. She is currently taking prescription vitamin D 50,000 IU each week. She denies nausea, vomiting or muscle weakness.  She endorses fatigue.  2. Prediabetes Jazzelle has a diagnosis of prediabetes based on her elevated HgA1c and was informed this puts her at greater risk of developing diabetes. She continues to work on diet and exercise to decrease her risk of diabetes. She denies nausea or hypoglycemia.  She is not on metformin.  Lab Results  Component Value Date   HGBA1C 6.2 (H) 12/01/2019   Lab Results  Component Value Date   INSULIN 32.3 (H) 12/01/2019   Assessment/Plan:   1. Vitamin D deficiency Low Vitamin D level contributes to fatigue and are associated with obesity, breast, and colon cancer. She agrees to continue to take prescription Vitamin D @50 ,000 IU every week and will follow-up for routine testing of Vitamin D, at least 2-3 times per year to avoid over-replacement. - Vitamin D, Ergocalciferol, (DRISDOL) 1.25 MG (50000 UNIT) CAPS capsule; TAKE 1 CAPSULE BY MOUTH EVERY 7 DAYS.   Dispense: 4 capsule; Refill: 0  2. Prediabetes Najai will continue to work on weight loss, exercise, and decreasing simple carbohydrates to help decrease the risk of diabetes.   3. Class 3 severe obesity with serious comorbidity and body mass index (BMI) of 40.0 to 44.9 in adult, unspecified obesity type (HCC) Valeri is currently in the action stage of change. As such, her goal is to continue with weight loss efforts. She has agreed to the Category 3 Plan.   Exercise goals: No exercise has been prescribed at this time.  Behavioral modification strategies: increasing lean protein intake, increasing vegetables, meal planning and cooking strategies, keeping healthy foods in the home and planning for success.  Marcine has agreed to follow-up with our clinic in 2 weeks. She was informed of the importance of frequent follow-up visits to maximize her success with intensive lifestyle modifications for her multiple health conditions.   Objective:   Blood pressure 128/74, pulse (!) 59, temperature 98.2 F (36.8 C), temperature source Oral, height 5\' 1"  (1.549 m), weight 226 lb (102.5 kg), SpO2 96 %. Body mass index is 42.7 kg/m.  General: Cooperative, alert, well developed, in no acute distress. HEENT: Conjunctivae and lids unremarkable. Cardiovascular: Regular rhythm.  Lungs: Normal work of breathing. Neurologic: No focal deficits.   Lab Results  Component Value Date   CREATININE 0.95 12/01/2019   BUN 15 12/01/2019   NA 141 12/01/2019   K 4.0 12/01/2019   CL 101 12/01/2019  CO2 19 (L) 12/01/2019   Lab Results  Component Value Date   ALT 23 12/01/2019   AST 28 12/01/2019   ALKPHOS 117 12/01/2019   BILITOT 0.5 12/01/2019   Lab Results  Component Value Date   HGBA1C 6.2 (H) 12/01/2019   HGBA1C 6.1 (H) 08/26/2019   HGBA1C 5.6 08/20/2018   HGBA1C 5.4 03/21/2018   HGBA1C 6.1 (H) 11/26/2017   Lab Results  Component Value Date   INSULIN 32.3 (H) 12/01/2019   Lab Results  Component  Value Date   TSH 1.550 12/01/2019   Lab Results  Component Value Date   CHOL 137 12/01/2019   HDL 44 12/01/2019   LDLCALC 58 12/01/2019   TRIG 213 (H) 12/01/2019   CHOLHDL 3.5 08/26/2019   Lab Results  Component Value Date   WBC 5.3 08/26/2019   HGB 14.4 08/26/2019   HCT 43.3 08/26/2019   MCV 87 08/26/2019   PLT 191 08/26/2019   Obesity Behavioral Intervention Documentation for Insurance:   Approximately 15 minutes were spent on the discussion below.  ASK: We discussed the diagnosis of obesity with [name] today and Micaela agreed to give [place] permission to discuss obesity behavioral modification therapy today.  ASSESS: Amilliana has the diagnosis of obesity and her BMI today is 42.7. Alveda is in the action stage of change.   ADVISE: Shalissa was educated on the multiple health risks of obesity as well as the benefit of weight loss to improve her health. She was advised of the need for long term treatment and the importance of lifestyle modifications to improve her current health and to decrease her risk of future health problems.  AGREE: Multiple dietary modification options and treatment options were discussed and Donnisha agreed to follow the recommendations documented in the above note.  ARRANGE: Chellie was educated on the importance of frequent visits to treat obesity as outlined per CMS and USPSTF guidelines and agreed to schedule her next follow up appointment today.  Attestation Statements:   Reviewed by clinician on day of visit: allergies, medications, problem list, medical history, surgical history, family history, social history, and previous encounter notes.  I, Water quality scientist, CMA, am acting as transcriptionist for Coralie Common, MD.  I have reviewed the above documentation for accuracy and completeness, and I agree with the above. - Jinny Blossom, MD

## 2019-12-31 ENCOUNTER — Other Ambulatory Visit: Payer: Self-pay | Admitting: Rheumatology

## 2019-12-31 ENCOUNTER — Other Ambulatory Visit (HOSPITAL_COMMUNITY): Payer: Self-pay | Admitting: Cardiology

## 2019-12-31 ENCOUNTER — Other Ambulatory Visit: Payer: Self-pay | Admitting: Adult Health

## 2020-01-01 ENCOUNTER — Telehealth: Payer: Self-pay | Admitting: *Deleted

## 2020-01-01 ENCOUNTER — Telehealth: Payer: Self-pay | Admitting: Rheumatology

## 2020-01-01 DIAGNOSIS — Z79899 Other long term (current) drug therapy: Secondary | ICD-10-CM

## 2020-01-01 NOTE — Telephone Encounter (Signed)
-----   Message from Sueanne Margarita, MD sent at 12/29/2019  2:51 PM EDT ----- Good AHI on PAP but needs to improve compliance

## 2020-01-01 NOTE — Telephone Encounter (Signed)
Patient is aware and agreeable to AHI being within range at 3.3. Patient is aware and agreeable to improving in compliance with machine usage.

## 2020-01-01 NOTE — Telephone Encounter (Signed)
Last Visit: 10/29/2019 Next Visit: 04/01/2020 Labs: 12/01/2019 CMP: CO2 19 08/26/2019 CBC WNL   Attempted to contact patient and left message on machine to advise patient she is due to update lab (CBC).   Okay to refill 30 day supply of arava and phenergan?

## 2020-01-01 NOTE — Telephone Encounter (Signed)
ok 

## 2020-01-01 NOTE — Telephone Encounter (Signed)
Patient left a voicemail stating she had her labs 6 weeks ago at [facility].  Patient states a copy of the results should be in her chart.  Please advise.

## 2020-01-01 NOTE — Telephone Encounter (Signed)
Advised patient we can view the labs but are in need of an updated CBC. Patient verbalized understanding and will get it drawn soon. Standing orders are in place.

## 2020-01-14 ENCOUNTER — Encounter (INDEPENDENT_AMBULATORY_CARE_PROVIDER_SITE_OTHER): Payer: Self-pay | Admitting: Family Medicine

## 2020-01-14 ENCOUNTER — Other Ambulatory Visit: Payer: Self-pay

## 2020-01-14 ENCOUNTER — Encounter: Payer: Self-pay | Admitting: Rheumatology

## 2020-01-14 ENCOUNTER — Ambulatory Visit (INDEPENDENT_AMBULATORY_CARE_PROVIDER_SITE_OTHER): Payer: PPO | Admitting: Family Medicine

## 2020-01-14 VITALS — BP 116/59 | HR 74 | Temp 98.3°F | Ht 61.0 in | Wt 221.0 lb

## 2020-01-14 DIAGNOSIS — Z6841 Body Mass Index (BMI) 40.0 and over, adult: Secondary | ICD-10-CM

## 2020-01-14 DIAGNOSIS — M069 Rheumatoid arthritis, unspecified: Secondary | ICD-10-CM | POA: Diagnosis not present

## 2020-01-14 DIAGNOSIS — Z79899 Other long term (current) drug therapy: Secondary | ICD-10-CM | POA: Diagnosis not present

## 2020-01-14 DIAGNOSIS — R7303 Prediabetes: Secondary | ICD-10-CM | POA: Diagnosis not present

## 2020-01-14 DIAGNOSIS — E7849 Other hyperlipidemia: Secondary | ICD-10-CM

## 2020-01-14 DIAGNOSIS — H40013 Open angle with borderline findings, low risk, bilateral: Secondary | ICD-10-CM | POA: Diagnosis not present

## 2020-01-14 DIAGNOSIS — H524 Presbyopia: Secondary | ICD-10-CM | POA: Diagnosis not present

## 2020-01-14 DIAGNOSIS — H04123 Dry eye syndrome of bilateral lacrimal glands: Secondary | ICD-10-CM | POA: Diagnosis not present

## 2020-01-14 NOTE — Progress Notes (Signed)
Chief Complaint:   OBESITY [name] is here to discuss her progress with her obesity treatment plan along with follow-up of her obesity related diagnoses. [name] is on the Category 3 Plan and states she is following her eating plan approximately 90% of the time. [name] states she is walking and taking the stairs more often.  Today's visit was #: 4 Starting weight: 239 lbs Starting date: 12/01/2019 Today's weight: 221 lbs Today's date: 01/14/2020 Total lbs lost to date: 18 lbs Total lbs lost since last in-office visit: 5 lbs  Interim History: [name] splurged on a baked potato and a side of vegetables yesterday.  She then had mostly yogurt and cottage cheese.  She denies hunger.  She says she is not getting the food in more often than not.  She is often skipping at least 1 meal per day.  She eats as much as she can and then stops.  She is [place] the beach on [place] with another patient in the clinic.  Subjective:   1. Prediabetes [name] has a diagnosis of prediabetes based on her elevated HgA1c and was informed this puts her at greater risk of developing diabetes. She continues to work on diet and exercise to decrease her risk of diabetes. She denies nausea or hypoglycemia.  She is not taking metformin.  Lab Results  Component Value Date   HGBA1C 6.2 (H) 12/01/2019   Lab Results  Component Value Date   INSULIN 32.3 (H) 12/01/2019   2. Other hyperlipidemia [name] has hyperlipidemia and has been trying to improve her cholesterol levels with intensive lifestyle modification including a low saturated fat diet, exercise and weight loss. She denies any chest pain, claudication or myalgias.  She is on a statin but is not on fenofibrate.  Lab Results  Component Value Date   ALT 23 12/01/2019   AST 28 12/01/2019   ALKPHOS 117 12/01/2019   BILITOT 0.5 12/01/2019   Lab Results  Component Value Date   CHOL 137 12/01/2019   HDL 44 12/01/2019   LDLCALC 58 12/01/2019   TRIG 213 (H) 12/01/2019     CHOLHDL 3.5 08/26/2019   Assessment/Plan:   1. Prediabetes [name] will continue to work on weight loss, exercise, and decreasing simple carbohydrates to help decrease the risk of diabetes.  Will repeat labs in early August.  2. Other hyperlipidemia Cardiovascular risk and specific lipid/LDL goals reviewed.  We discussed several lifestyle modifications today and [name] will continue to work on diet, exercise and weight loss efforts. Orders and follow up as documented in patient record.   Counseling Intensive lifestyle modifications are the first line treatment for this issue. . Dietary changes: Increase soluble fiber. Decrease simple carbohydrates. . Exercise changes: Moderate to vigorous-intensity aerobic activity 150 minutes per week if tolerated. . Lipid-lowering medications: see documented in medical record.  3. Class 3 severe obesity with serious comorbidity and body mass index (BMI) of 40.0 to 44.9 in adult, unspecified obesity type (HCC) [name] is currently in the action stage of change. As such, her goal is to continue with weight loss efforts. She has agreed to the Category 3 Plan.   Exercise goals: As is.  Behavioral modification strategies: increasing lean protein intake, meal planning and cooking strategies, keeping healthy foods in the home and planning for success.  [name] has agreed to follow-up with our clinic in 3 weeks. She was informed of the importance of frequent follow-up visits to maximize her success with intensive lifestyle modifications for  her multiple health conditions.   Objective:   Blood pressure (!) 116/59, pulse 74, temperature 98.3 F (36.8 C), temperature source Oral, height 5\' 1"  (1.549 m), weight 221 lb (100.2 kg), SpO2 95 %. Body mass index is 41.76 kg/m.  General: Cooperative, alert, well developed, in no acute distress. HEENT: Conjunctivae and lids unremarkable. Cardiovascular: Regular rhythm.  Lungs: Normal work of breathing. Neurologic: No focal  deficits.   Lab Results  Component Value Date   CREATININE 0.95 12/01/2019   BUN 15 12/01/2019   NA 141 12/01/2019   K 4.0 12/01/2019   CL 101 12/01/2019   CO2 19 (L) 12/01/2019   Lab Results  Component Value Date   ALT 23 12/01/2019   AST 28 12/01/2019   ALKPHOS 117 12/01/2019   BILITOT 0.5 12/01/2019   Lab Results  Component Value Date   HGBA1C 6.2 (H) 12/01/2019   HGBA1C 6.1 (H) 08/26/2019   HGBA1C 5.6 08/20/2018   HGBA1C 5.4 03/21/2018   HGBA1C 6.1 (H) 11/26/2017   Lab Results  Component Value Date   INSULIN 32.3 (H) 12/01/2019   Lab Results  Component Value Date   TSH 1.550 12/01/2019   Lab Results  Component Value Date   CHOL 137 12/01/2019   HDL 44 12/01/2019   LDLCALC 58 12/01/2019   TRIG 213 (H) 12/01/2019   CHOLHDL 3.5 08/26/2019   Lab Results  Component Value Date   WBC 5.3 08/26/2019   HGB 14.4 08/26/2019   HCT 43.3 08/26/2019   MCV 87 08/26/2019   PLT 191 08/26/2019   Obesity Behavioral Intervention Documentation for Insurance:   Approximately 15 minutes were spent on the discussion below.  ASK: We discussed the diagnosis of obesity with [name] today and [name] agreed to give [place] permission to discuss obesity behavioral modification therapy today.  ASSESS: [name] has the diagnosis of obesity and her BMI today is 41.9. [name] is in the action stage of change.   ADVISE: [name] was educated on the multiple health risks of obesity as well as the benefit of weight loss to improve her health. She was advised of the need for long term treatment and the importance of lifestyle modifications to improve her current health and to decrease her risk of future health problems.  AGREE: Multiple dietary modification options and treatment options were discussed and [name] agreed to follow the recommendations documented in the above note.  ARRANGE: [name] was educated on the importance of frequent visits to treat obesity as outlined per CMS and USPSTF guidelines and  agreed to schedule her next follow up appointment today.  Attestation Statements:   Reviewed by clinician on day of visit: allergies, medications, problem list, medical history, surgical history, family history, social history, and previous encounter notes.  Time spent on visit including pre-visit chart review and post-visit care and charting was 15 minutes.   I, Water quality scientist, CMA, am acting as transcriptionist for Coralie Common, MD. I have reviewed the above documentation for accuracy and completeness, and I agree with the above. - Jinny Blossom, MD

## 2020-02-03 DIAGNOSIS — G4733 Obstructive sleep apnea (adult) (pediatric): Secondary | ICD-10-CM | POA: Diagnosis not present

## 2020-02-05 ENCOUNTER — Encounter (INDEPENDENT_AMBULATORY_CARE_PROVIDER_SITE_OTHER): Payer: Self-pay | Admitting: Family Medicine

## 2020-02-05 ENCOUNTER — Ambulatory Visit (INDEPENDENT_AMBULATORY_CARE_PROVIDER_SITE_OTHER): Payer: PPO | Admitting: Family Medicine

## 2020-02-05 ENCOUNTER — Other Ambulatory Visit: Payer: Self-pay

## 2020-02-05 VITALS — BP 98/58 | HR 58 | Temp 98.3°F | Ht 61.0 in | Wt 220.0 lb

## 2020-02-05 DIAGNOSIS — I9589 Other hypotension: Secondary | ICD-10-CM | POA: Diagnosis not present

## 2020-02-05 DIAGNOSIS — F3289 Other specified depressive episodes: Secondary | ICD-10-CM | POA: Diagnosis not present

## 2020-02-05 DIAGNOSIS — Z6841 Body Mass Index (BMI) 40.0 and over, adult: Secondary | ICD-10-CM

## 2020-02-09 ENCOUNTER — Other Ambulatory Visit: Payer: Self-pay | Admitting: Family Medicine

## 2020-02-09 ENCOUNTER — Other Ambulatory Visit: Payer: Self-pay | Admitting: Cardiology

## 2020-02-09 ENCOUNTER — Other Ambulatory Visit: Payer: Self-pay | Admitting: Rheumatology

## 2020-02-09 DIAGNOSIS — E559 Vitamin D deficiency, unspecified: Secondary | ICD-10-CM

## 2020-02-09 DIAGNOSIS — M3501 Sicca syndrome with keratoconjunctivitis: Secondary | ICD-10-CM

## 2020-02-09 DIAGNOSIS — M0579 Rheumatoid arthritis with rheumatoid factor of multiple sites without organ or systems involvement: Secondary | ICD-10-CM

## 2020-02-09 DIAGNOSIS — E782 Mixed hyperlipidemia: Secondary | ICD-10-CM

## 2020-02-09 NOTE — Telephone Encounter (Addendum)
Last Visit: 10/29/2019 Next Visit: 04/01/2020 Labs: 12/01/2019 CMP: CO2 19 08/26/2019 CBC WNL  PLQ Eye Exam: 01/14/2020 WNL  Attempted to contact the patient and left message to advise patient she is due to update labs.  Current Dose per office note on 10/29/2019: Arava 20 mg daily and Plaquenil 200 mg 1 tablet by mouth twice daily Zanaflex 4 mg by mouth at bedtime as needed  Okay to refill PLQ, Zanaflex and Arava?

## 2020-02-09 NOTE — Progress Notes (Signed)
Chief Complaint:   OBESITY Leliana is here to discuss her progress with her obesity treatment plan along with follow-up of her obesity related diagnoses. Dayanna is on the Category 3 Plan and states she is following her eating plan approximately 75-80% of the time. Kyna states she is walking more, and swimming more.  Today's visit was #: 5 Starting weight: 239 lbs Starting date: 12/01/2019 Today's weight: 220 lbs Today's date: 02/05/2020 Total lbs lost to date: 19 Total lbs lost since last in-office visit: 1  Interim History: Lux did go to [facility] over the last few weeks and she found she did go back to her vice of soda. She has had a very stressful day. She has no plans for the upcoming few weeks, and obstacles for the next upcoming 2 weeks. For snack calories she is occasionally getting in all calories. She notes occasionally cravings for potatoes or rice.  Subjective:   1. Other specified hypotension Belvia's blood pressure is slightly low today. She denies dizziness or syncope.  2. Other depression with emotional eating  Kellyjo denies suicidal ideas or homicidal ideas. She is not on medications currently.  Assessment/Plan:   1. Other specified hypotension Ellamay is working on healthy weight loss and exercise to improve blood pressure control. We will watch for signs of hypotension as she continues her lifestyle modifications. We will follow up on her blood pressure at her next appointment.  2. Other depression with emotional eating  Behavior modification techniques were discussed today to help Kenyon deal with her emotional/non-hunger eating behaviors. We will refer to [facility] for psychotherapy. Orders and follow up as documented in patient record.   - Ambulatory referral to Psychiatry  3. Class 3 severe obesity with serious comorbidity and body mass index (BMI) of 40.0 to 44.9 in adult, unspecified obesity type (HCC) Raynie is currently in the action stage of change. As such,  her goal is to continue with weight loss efforts. She has agreed to the Category 3 Plan.   Exercise goals: As is.  Behavioral modification strategies: increasing lean protein intake, increasing vegetables, meal planning and cooking strategies and keeping healthy foods in the home.  Cleora has agreed to follow-up with our clinic in 2 to 3 weeks. She was informed of the importance of frequent follow-up visits to maximize her success with intensive lifestyle modifications for her multiple health conditions.   Objective:   Blood pressure (!) 98/58, pulse (!) 58, temperature 98.3 F (36.8 C), temperature source Oral, height 5\' 1"  (1.549 m), weight 220 lb (99.8 kg), SpO2 95 %. Body mass index is 41.57 kg/m.  General: Cooperative, alert, well developed, in no acute distress. HEENT: Conjunctivae and lids unremarkable. Cardiovascular: Regular rhythm.  Lungs: Normal work of breathing. Neurologic: No focal deficits.   Lab Results  Component Value Date   CREATININE 0.95 12/01/2019   BUN 15 12/01/2019   NA 141 12/01/2019   K 4.0 12/01/2019   CL 101 12/01/2019   CO2 19 (L) 12/01/2019   Lab Results  Component Value Date   ALT 23 12/01/2019   AST 28 12/01/2019   ALKPHOS 117 12/01/2019   BILITOT 0.5 12/01/2019   Lab Results  Component Value Date   HGBA1C 6.2 (H) 12/01/2019   HGBA1C 6.1 (H) 08/26/2019   HGBA1C 5.6 08/20/2018   HGBA1C 5.4 03/21/2018   HGBA1C 6.1 (H) 11/26/2017   Lab Results  Component Value Date   INSULIN 32.3 (H) 12/01/2019   Lab Results  Component Value Date   TSH 1.550 12/01/2019   Lab Results  Component Value Date   CHOL 137 12/01/2019   HDL 44 12/01/2019   LDLCALC 58 12/01/2019   TRIG 213 (H) 12/01/2019   CHOLHDL 3.5 08/26/2019   Lab Results  Component Value Date   WBC 5.3 08/26/2019   HGB 14.4 08/26/2019   HCT 43.3 08/26/2019   MCV 87 08/26/2019   PLT 191 08/26/2019   No results found for: IRON, TIBC, FERRITIN  Obesity Behavioral Intervention  Documentation for Insurance:   Approximately 15 minutes were spent on the discussion below.  ASK: We discussed the diagnosis of obesity with [name] today and Deyja agreed to give [place] permission to discuss obesity behavioral modification therapy today.  ASSESS: Sefora has the diagnosis of obesity and her BMI today is 41.59. [name] is in the action stage of change.   ADVISE: Gissela was educated on the multiple health risks of obesity as well as the benefit of weight loss to improve her health. She was advised of the need for long term treatment and the importance of lifestyle modifications to improve her current health and to decrease her risk of future health problems.  AGREE: Multiple dietary modification options and treatment options were discussed and Chole agreed to follow the recommendations documented in the above note.  ARRANGE: Marzetta was educated on the importance of frequent visits to treat obesity as outlined per CMS and USPSTF guidelines and agreed to schedule her next follow up appointment today.  Attestation Statements:   Reviewed by clinician on day of visit: allergies, medications, problem list, medical history, surgical history, family history, social history, and previous encounter notes.   I, Trixie Dredge, am acting as transcriptionist for Coralie Common, MD.  I have reviewed the above documentation for accuracy and completeness, and I agree with the above. - Jinny Blossom, MD

## 2020-02-10 ENCOUNTER — Encounter: Payer: Self-pay | Admitting: Adult Health

## 2020-02-10 ENCOUNTER — Other Ambulatory Visit: Payer: Self-pay

## 2020-02-10 ENCOUNTER — Ambulatory Visit: Payer: PPO | Admitting: Adult Health

## 2020-02-10 VITALS — BP 136/64 | HR 83 | Ht 61.0 in | Wt 218.0 lb

## 2020-02-10 DIAGNOSIS — G629 Polyneuropathy, unspecified: Secondary | ICD-10-CM | POA: Diagnosis not present

## 2020-02-10 DIAGNOSIS — Z79899 Other long term (current) drug therapy: Secondary | ICD-10-CM | POA: Diagnosis not present

## 2020-02-10 LAB — CBC WITH DIFFERENTIAL/PLATELET
Absolute Monocytes: 674 cells/uL (ref 200–950)
Basophils Absolute: 63 cells/uL (ref 0–200)
Basophils Relative: 1 %
Eosinophils Absolute: 69 cells/uL (ref 15–500)
Eosinophils Relative: 1.1 %
HCT: 40.6 % (ref 35.0–45.0)
Hemoglobin: 13.4 g/dL (ref 11.7–15.5)
Lymphs Abs: 907 cells/uL (ref 850–3900)
MCH: 28.9 pg (ref 27.0–33.0)
MCHC: 33 g/dL (ref 32.0–36.0)
MCV: 87.7 fL (ref 80.0–100.0)
MPV: 12.5 fL (ref 7.5–12.5)
Monocytes Relative: 10.7 %
Neutro Abs: 4586 cells/uL (ref 1500–7800)
Neutrophils Relative %: 72.8 %
Platelets: 176 10*3/uL (ref 140–400)
RBC: 4.63 10*6/uL (ref 3.80–5.10)
RDW: 13.8 % (ref 11.0–15.0)
Total Lymphocyte: 14.4 %
WBC: 6.3 10*3/uL (ref 3.8–10.8)

## 2020-02-10 LAB — COMPLETE METABOLIC PANEL WITH GFR
AG Ratio: 1.8 (calc) (ref 1.0–2.5)
ALT: 19 U/L (ref 6–29)
AST: 20 U/L (ref 10–35)
Albumin: 3.9 g/dL (ref 3.6–5.1)
Alkaline phosphatase (APISO): 97 U/L (ref 37–153)
BUN/Creatinine Ratio: 15 (calc) (ref 6–22)
BUN: 19 mg/dL (ref 7–25)
CO2: 24 mmol/L (ref 20–32)
Calcium: 9.1 mg/dL (ref 8.6–10.4)
Chloride: 105 mmol/L (ref 98–110)
Creat: 1.24 mg/dL — ABNORMAL HIGH (ref 0.50–0.99)
GFR, Est African American: 52 mL/min/{1.73_m2} — ABNORMAL LOW (ref >=60)
GFR, Est Non African American: 45 mL/min/{1.73_m2} — ABNORMAL LOW (ref >=60)
Globulin: 2.2 g/dL (calc) (ref 1.9–3.7)
Glucose, Bld: 129 mg/dL — ABNORMAL HIGH (ref 65–99)
Potassium: 4.2 mmol/L (ref 3.5–5.3)
Sodium: 138 mmol/L (ref 135–146)
Total Bilirubin: 0.5 mg/dL (ref 0.2–1.2)
Total Protein: 6.1 g/dL (ref 6.1–8.1)

## 2020-02-10 NOTE — Patient Instructions (Signed)
Your Plan:  Decrease Gabapentin to 2 tablets twice a day Compound cream ordered If your symptoms worsen or you develop new symptoms please let us know.   Thank you for coming to see [place] at [facility]. I hope we have been able to provide you high quality care today.  You may receive a patient satisfaction survey over the next few weeks. We would appreciate your feedback and comments so that we may continue to improve ourselves and the health of our patients.

## 2020-02-10 NOTE — Progress Notes (Signed)
PATIENT: [name] DOB: 1951/03/18  REASON FOR VISIT: follow up HISTORY FROM: patient  HISTORY OF PRESENT ILLNESS: Today 02/10/20:  [name] is a 69 year old female with a history of peripheral neuropathy.  She returns today for follow-up.  Reports that she was able to wean herself off of carbamazepine.  She remains on gabapentin 600 mg in the morning and 900 mg at bedtime.  She states that this controls her discomfort.  Reports that she does have numbness and tingling in the lower extremities.  Denies any significant changes with her gait or balance.  No falls.  She returns today for an evaluation.  HISTORY 09/02/18 :  [name] is a 69 year old female with a history of rheumatoid arthritis and peripheral neuropathy.  She returns today for follow-up.  She is currently taking carbamazepine and gabapentin.  She reports that this is managing her neuropathy pain well.  She states that she still has some numbness and tingling throughout the day but it is manageable.  She denies any significant changes with her gait or balance.  She did have a fall back in November while in [place].  She states that she did break some vertebrae but has seen orthopedist and has since been released.  She states that she will use a cane and walker only for long distances.  She states that she was given lidocaine patches by her rheumatologist for RA.  She reports that she is used those on her feet and legs and it is beneficial for her neuropathy.  She would like [place] to try to refill this to see if it will be approved by her insurance.  Patient also states that gabapentin seems to have decreased her libido.  She expressed that she would like to wean off this medication as long as her pain is under control.  She returns today for evaluation.  REVIEW OF SYSTEMS: Out of a complete 14 system review of symptoms, the patient complains only of the following symptoms, and all other reviewed systems are negative.  See  HPI  ALLERGIES: Allergies  Allergen Reactions  . Sulfa Antibiotics Hives and Itching    Flushing also  . Cymbalta [Duloxetine Hcl] Other (See Comments)    Caused a manic reaction  . Demerol Hives and Other (See Comments)    Fever also  . Ivp Dye [Iodinated Diagnostic Agents] Hives and Other (See Comments)    Fever also  . Morphine And Related Other (See Comments)    ineffective  . Sulfasalazine Other (See Comments)    Reaction ??  . Adhesive [Tape] Rash    HOME MEDICATIONS: Outpatient Medications Prior to Visit  Medication Sig Dispense Refill  . aspirin EC 81 MG tablet Take 81 mg by mouth at bedtime.    [name] atorvastatin (LIPITOR) 20 MG tablet TAKE 1 TABLET BY MOUTH DAILY. 90 tablet 1  . Calcium Carbonate-Vit D-Min (GNP CALCIUM 1200) 1200-1000 MG-UNIT CHEW Calcium 1200mg  qd OTC 30 tablet   . cycloSPORINE (RESTASIS) 0.05 % ophthalmic emulsion Place 2 drops into both eyes 2 (two) times daily.     . diphenoxylate-atropine (LOMOTIL) 2.5-0.025 MG per tablet Take 1 tablet by mouth 4 (four) times daily as needed for diarrhea or loose stools.    . fluticasone (FLONASE) 50 MCG/ACT nasal spray Use 1 spray per nostril 1-2 times daily as needed 16 g 5  . gabapentin (NEURONTIN) 300 MG capsule TAKE 2 CAPSULES BY MOUTH IN THE MORNING AND 3 CAPSULES AT BEDTIME 450 capsule  1  . hydroxychloroquine (PLAQUENIL) 200 MG tablet TAKE 1 TABLET BY MOUTH TWICE A DAY MONDAY THROUGH FRIDAY. 120 tablet 0  . hyoscyamine (ANASPAZ) 0.125 MG TBDP disintergrating tablet Place 0.25 mg under the tongue every 6 (six) hours as needed for cramping.     . leflunomide (ARAVA) 20 MG tablet TAKE 1 TABLET BY MOUTH DAILY. 30 tablet 0  . levocetirizine (XYZAL) 5 MG tablet TAKE 1 TABLET BY MOUTH EVERY EVENING. 30 tablet 5  . lidocaine (LIDODERM) 5 % PLACE 1 PATCH ONTO THE SKIN DAILY AS NEEDED FOR PAIN. REMOVE AND DISCARD PATCH WITHIN 12 HOURS OR AS DIRECTED BY MD 90 patch 0  . methocarbamol (ROBAXIN) 500 MG tablet Take 1 tablet  (500 mg total) by mouth daily as needed for muscle spasms. 30 tablet 0  . metoprolol succinate (TOPROL-XL) 25 MG 24 hr tablet Take 1 tablet (25 mg total) by mouth daily. Please keep upcoming appt with Dr. Radford Pax in April for future refills. Thank you 90 tablet 0  . NONFORMULARY OR COMPOUNDED ITEM Triamcinolone 0.1% & Silvadene cream 1:1- Apply as directed to affected areas as needed    . potassium chloride (KLOR-CON) 10 MEQ tablet Take 2 tablets (20 mEq total) by mouth daily. 186 tablet 3  . Probiotic Product (PROBIOTIC DAILY PO) Take by mouth. Ultraflora IB Probiotic    . promethazine (PHENERGAN) 25 MG tablet TAKE 1 TABLET BY MOUTH EVERY 6 HOURS AS NEEDED. 120 tablet 0  . spironolactone (ALDACTONE) 25 MG tablet Take 1 tablet (25 mg total) by mouth 2 (two) times daily. 180 tablet 1  . tiZANidine (ZANAFLEX) 4 MG tablet TAKE 1 TABLET BY MOUTH AT BEDTIME. 90 tablet 0  . torsemide (DEMADEX) 20 MG tablet Take 1 tablet (20 mg total) by mouth 2 (two) times daily. 60 tablet 3  . UNABLE TO FIND CPAP: AT bedtime; setting is "12"    . Vitamin D, Ergocalciferol, (DRISDOL) 1.25 MG (50000 UNIT) CAPS capsule TAKE 1 CAPSULE BY MOUTH EVERY 7 DAYS. 4 capsule 0   No facility-administered medications prior to visit.    PAST MEDICAL HISTORY: Past Medical History:  Diagnosis Date  . Arthritis    Rheumatoid arthritis,   . Back pain   . Blood transfusion    1981  . CHF (congestive heart failure) ([place])   . Chronic diastolic CHF (congestive heart failure) (HCC)    diastolic   . DDD (degenerative disc disease), cervical   . DDD (degenerative disc disease), lumbar   . Edema of both lower extremities   . Fibromyalgia   . Gallbladder problem   . GERD (gastroesophageal reflux disease)   . Heart murmur    as a child  . Hiatal hernia    sjorgens syndrome  . High blood pressure   . High cholesterol   . IBS (irritable bowel syndrome)   . Joint pain   . Numbness and tingling of both feet   . Numbness of fingers    . OSA (obstructive sleep apnea)   . Osteoarthritis   . Peripheral autonomic neuropathy of unknown cause   . Pneumonia 07/2018  . Polyneuropathy   . Pre-diabetes   . PVC (premature ventricular contraction)   . Raynaud disease   . Rheumatoid arthritis ([place])   . Sjogren's disease ([place])   . Sleep apnea   . SOB (shortness of breath)   . Swallowing difficulty   . Urticaria   . Vitamin D deficiency     PAST SURGICAL HISTORY: Past  Surgical History:  Procedure Laterality Date  . ABDOMINAL HYSTERECTOMY     BTL, BSO  . ADENOIDECTOMY    . BREAST SURGERY     mass removal   . CHOLECYSTECTOMY    . dental implants    . DIAGNOSTIC LAPAROSCOPY     x3  . KNEE ARTHROSCOPY     x2  . MASS EXCISION Left 03/22/2017   Procedure: EXCISION OF LEFT BREAST MASS;  Surgeon: Donnie Mesa, MD;  Location: [facility];  Service: General;  Laterality: Left;  . patotid cystectomy    . RIGHT/LEFT HEART CATH AND CORONARY ANGIOGRAPHY N/A 07/25/2019   Procedure: RIGHT/LEFT HEART CATH AND CORONARY ANGIOGRAPHY;  Surgeon: Jolaine Artist, MD;  Location: [facility];  Service: Cardiovascular;  Laterality: N/A;  . TENDON REPAIR  1980   left ankle and tibia  . TONSILLECTOMY    . TOTAL KNEE ARTHROPLASTY  06/21/2011   Procedure: TOTAL KNEE ARTHROPLASTY;  Surgeon: Gearlean Alf;  Location: [facility];  Service: Orthopedics;  Laterality: Right;  . TUBAL LIGATION      FAMILY HISTORY: Family History  Problem Relation Age of Onset  . Alzheimer's disease Mother   . Heart attack Mother   . Hypertension Mother   . Glaucoma Mother   . High Cholesterol Mother   . Heart disease Mother   . Depression Mother   . Cancer Father   . High Cholesterol Father   . Heart disease Father   . Sleep apnea Father   . Heart attack Brother   . Heart disease Brother   . Glaucoma Brother   . Hyperlipidemia Brother   . Glaucoma Brother   . Asthma Son   . Allergic rhinitis Neg Hx   . Angioedema Neg Hx   . Eczema Neg Hx   .  Urticaria Neg Hx     SOCIAL HISTORY: Social History   Socioeconomic History  . Marital status: Widowed    Spouse name: Not on file  . Number of children: 2  . Years of education: AS  . Highest education level: Not on file  Occupational History  . Occupation: retired Marine scientist  . Occupation: Therapist, sports  Tobacco Use  . Smoking status: Never Smoker  . Smokeless tobacco: Never Used  Vaping Use  . Vaping Use: Never used  Substance and Sexual Activity  . Alcohol use: Yes    Comment: socially   . Drug use: No  . Sexual activity: Never  Other Topics Concern  . Not on file  Social History Narrative   Lives at home alone   Right-handed   Drinks 1 or less cups of coffee and 2 servings of either tea or soda per day   Social Determinants of Health   Financial Resource Strain:   . Difficulty of Paying Living Expenses:   Food Insecurity:   . Worried About Charity fundraiser in the Last Year:   . Arboriculturist in the Last Year:   Transportation Needs:   . Film/video editor (Medical):   [name] Lack of Transportation (Non-Medical):   Physical Activity:   . Days of Exercise per Week:   . Minutes of Exercise per Session:   Stress:   . Feeling of Stress :   Social Connections:   . Frequency of Communication with Friends and Family:   . Frequency of Social Gatherings with Friends and Family:   . Attends Religious Services:   . Active Member of Clubs or Organizations:   .  Attends Archivist Meetings:   [name] Marital Status:   Intimate Partner Violence:   . Fear of Current or Ex-Partner:   . Emotionally Abused:   [name] Physically Abused:   . Sexually Abused:       PHYSICAL EXAM  Vitals:   02/10/20 1127  BP: 136/64  Pulse: 83  Weight: 218 lb (98.9 kg)  Height: 5\' 1"  (1.549 m)   Body mass index is 41.19 kg/m.  Generalized: Well developed, in no acute distress   Neurological examination  Mentation: Alert oriented to time, place, history taking. Follows all commands speech  and language fluent Cranial nerve II-XII: Pupils were equal round reactive to light. Extraocular movements were full, visual field were full on confrontational test. Facial sensation and strength were normal. Uvula tongue midline. Head turning and shoulder shrug  were normal and symmetric. Motor: The motor testing reveals 5 over 5 strength of all 4 extremities. Good symmetric motor tone is noted throughout.  Sensory: Sensory testing is intact to soft touch on all 4 extremities. No evidence of extinction is noted.  Coordination: Cerebellar testing reveals good finger-nose-finger and heel-to-shin bilaterally.  Gait and station: Gait is normal. Tandem gait is normal. Romberg is negative. No drift is seen.  Reflexes: Deep tendon reflexes are symmetric and normal bilaterally.   DIAGNOSTIC DATA (LABS, IMAGING, TESTING) - I reviewed patient records, labs, notes, testing and imaging myself where available.  Lab Results  Component Value Date   WBC 5.3 08/26/2019   HGB 14.4 08/26/2019   HCT 43.3 08/26/2019   MCV 87 08/26/2019   PLT 191 08/26/2019      Component Value Date/Time   NA 141 12/01/2019 1418   K 4.0 12/01/2019 1418   CL 101 12/01/2019 1418   CO2 19 (L) 12/01/2019 1418   GLUCOSE 91 12/01/2019 1418   GLUCOSE 127 (H) 08/05/2019 1128   BUN 15 12/01/2019 1418   CREATININE 0.95 12/01/2019 1418   CREATININE 0.76 02/10/2019 1422   CALCIUM 9.4 12/01/2019 1418   PROT 6.5 12/01/2019 1418   ALBUMIN 4.1 12/01/2019 1418   AST 28 12/01/2019 1418   ALT 23 12/01/2019 1418   ALKPHOS 117 12/01/2019 1418   BILITOT 0.5 12/01/2019 1418   GFRNONAA 62 12/01/2019 1418   GFRNONAA 82 02/10/2019 1422   GFRAA 72 12/01/2019 1418   GFRAA 95 02/10/2019 1422   Lab Results  Component Value Date   CHOL 137 12/01/2019   HDL 44 12/01/2019   LDLCALC 58 12/01/2019   TRIG 213 (H) 12/01/2019   CHOLHDL 3.5 08/26/2019   Lab Results  Component Value Date   HGBA1C 6.2 (H) 12/01/2019   Lab Results   Component Value Date   IONGEXBM84 132 07/02/2017   Lab Results  Component Value Date   TSH 1.550 12/01/2019      ASSESSMENT AND PLAN 69 y.o. year old female  has a past medical history of Arthritis, Back pain, Blood transfusion, CHF (congestive heart failure) (H[facility]), Chronic diastolic CHF (congestive heart failure) ([place]), DDD (degenerative disc disease), cervical, DDD (degenerative disc disease), lumbar, Edema of both lower extremities, Fibromyalgia, Gallbladder problem, GERD (gastroesophageal reflux disease), Heart murmur, Hiatal hernia, High blood pressure, High cholesterol, IBS (irritable bowel syndrome), Joint pain, Numbness and tingling of both feet, Numbness of fingers, OSA (obstructive sleep apnea), Osteoarthritis, Peripheral autonomic neuropathy of unknown cause, Pneumonia (07/2018), Polyneuropathy, Pre-diabetes, PVC (premature ventricular contraction), Raynaud disease, Rheumatoid arthritis ([place]), Sjogren's disease ([place]), Sleep apnea, SOB (shortness of breath), Swallowing difficulty, Urticaria,  and Vitamin D deficiency. here with:  Peripheral neuropathy   Continue gabapentin-- can try reducing the dose to 600 mg twice a day  Advised if symptoms worsen or she develops new symptoms she should let us know  Follow-up in 6 months or sooner if needed   I spent he is better today minutes of face-to-face and non-face-to-face time with patient.  This included previsit chart review, lab review, study review, order entry, electronic health record documentation, patient education.  Ward Givens, MSN, NP-C 02/10/2020, 11:33 AM [facility] [address], [place], [place] [address] [phone]

## 2020-02-10 NOTE — Telephone Encounter (Signed)
Patient returned call to the office and states she is coming to the office 02/10/2020 to have labs. Patient is requesting a 90 day supply of [place].

## 2020-02-11 NOTE — Progress Notes (Signed)
Glucose is mildly elevated, patient is diabetic.  Creatinine is elevated.  She is on diuretics.  Please forward labs to her PCP.

## 2020-02-12 NOTE — Progress Notes (Signed)
I have read the note, and I agree with the clinical assessment and plan.  Dianna Deshler K Saaya Procell   

## 2020-02-16 ENCOUNTER — Encounter (INDEPENDENT_AMBULATORY_CARE_PROVIDER_SITE_OTHER): Payer: Self-pay | Admitting: Family Medicine

## 2020-02-17 DIAGNOSIS — G4733 Obstructive sleep apnea (adult) (pediatric): Secondary | ICD-10-CM | POA: Diagnosis not present

## 2020-02-17 DIAGNOSIS — R269 Unspecified abnormalities of gait and mobility: Secondary | ICD-10-CM | POA: Diagnosis not present

## 2020-02-17 NOTE — Telephone Encounter (Signed)
Please review

## 2020-02-24 ENCOUNTER — Telehealth: Payer: Self-pay | Admitting: General Practice

## 2020-02-24 ENCOUNTER — Ambulatory Visit (INDEPENDENT_AMBULATORY_CARE_PROVIDER_SITE_OTHER): Payer: PPO | Admitting: Family Medicine

## 2020-02-24 DIAGNOSIS — E559 Vitamin D deficiency, unspecified: Secondary | ICD-10-CM

## 2020-02-24 DIAGNOSIS — E782 Mixed hyperlipidemia: Secondary | ICD-10-CM

## 2020-02-24 MED ORDER — ATORVASTATIN CALCIUM 20 MG PO TABS: 20.0000 mg | ORAL_TABLET | Freq: Every day | ORAL | 0 refills | Status: DC

## 2020-02-24 MED ORDER — VITAMIN D (ERGOCALCIFEROL) 1.25 MG (50000 UNIT) PO CAPS: ORAL_CAPSULE | ORAL | 0 refills | Status: DC

## 2020-02-24 NOTE — Telephone Encounter (Signed)
Rxs for 60 days sent to pharmacy.  No further refills until pt has had a f/u.  Please call pt to schedule appt.  [name], CMA

## 2020-02-24 NOTE — Telephone Encounter (Signed)
Patient is requesting a refill of her Lipitor and Vit D, if approved please send to [facility].  Also, patient sent MyChart message last week but was routed to previous PCP. She had labs done and [facility], she had some clinical questions about the results and was directed to contact PCP office for answers.

## 2020-02-24 NOTE — Addendum Note (Signed)
Addended by: Fonnie Mu on: 02/24/2020 11:32 AM   Modules accepted: Orders

## 2020-02-24 NOTE — Telephone Encounter (Signed)
Pt is concerned about her kidney function tests.  Please advise.  [name], CMA

## 2020-02-25 NOTE — Telephone Encounter (Signed)
Per chart review, patient's previous kidney function has been stable and within normal limits. Has anything changed recently such as starting new medications, new supplements, taking excessive NSAIDs like Advil or Aleve, is she staying well hydrated? She is followed by cardiology for CHF and is on torsemide, which is a diuretic. Recommend to hold off on torsemide for now, avoid NSAIDs, and stay well hydrated- at least 64 fl oz, and want to repeat her kidney function in 1 week.   Thank you, Herb Grays

## 2020-02-26 NOTE — Telephone Encounter (Signed)
MyChart message sent to pt.  T. Beretta Ginsberg, CMA 

## 2020-03-01 ENCOUNTER — Other Ambulatory Visit: Payer: Self-pay

## 2020-03-01 ENCOUNTER — Ambulatory Visit (INDEPENDENT_AMBULATORY_CARE_PROVIDER_SITE_OTHER): Payer: PPO | Admitting: Psychologist

## 2020-03-01 ENCOUNTER — Encounter (INDEPENDENT_AMBULATORY_CARE_PROVIDER_SITE_OTHER): Payer: Self-pay | Admitting: Family Medicine

## 2020-03-01 ENCOUNTER — Ambulatory Visit (INDEPENDENT_AMBULATORY_CARE_PROVIDER_SITE_OTHER): Payer: PPO | Admitting: Family Medicine

## 2020-03-01 VITALS — BP 96/62 | HR 83 | Temp 98.6°F | Ht 61.0 in | Wt 215.0 lb

## 2020-03-01 DIAGNOSIS — N1831 Chronic kidney disease, stage 3a: Secondary | ICD-10-CM | POA: Diagnosis not present

## 2020-03-01 DIAGNOSIS — F320 Major depressive disorder, single episode, mild: Secondary | ICD-10-CM | POA: Diagnosis not present

## 2020-03-01 DIAGNOSIS — Z6841 Body Mass Index (BMI) 40.0 and over, adult: Secondary | ICD-10-CM

## 2020-03-01 DIAGNOSIS — F3289 Other specified depressive episodes: Secondary | ICD-10-CM | POA: Diagnosis not present

## 2020-03-02 LAB — BASIC METABOLIC PANEL
BUN/Creatinine Ratio: 18 (ref 12–28)
BUN: 20 mg/dL (ref 8–27)
CO2: 23 mmol/L (ref 20–29)
Calcium: 10 mg/dL (ref 8.7–10.3)
Chloride: 104 mmol/L (ref 96–106)
Creatinine, Ser: 1.11 mg/dL — ABNORMAL HIGH (ref 0.57–1.00)
GFR calc Af Amer: 59 mL/min/{1.73_m2} — ABNORMAL LOW (ref >59)
GFR calc non Af Amer: 52 mL/min/{1.73_m2} — ABNORMAL LOW (ref >59)
Glucose: 125 mg/dL — ABNORMAL HIGH (ref 65–99)
Potassium: 4.5 mmol/L (ref 3.5–5.2)
Sodium: 142 mmol/L (ref 134–144)

## 2020-03-03 NOTE — Progress Notes (Signed)
Chief Complaint:   OBESITY [name] is here to discuss her progress with her obesity treatment plan along with follow-up of her obesity related diagnoses. [name] is on the Category 3 Plan and states she is following her eating plan approximately 85% of the time. Steph states she is walking 2-3 times per week.  Today's visit was #: 6 Starting weight: 239 lbs Starting date: 12/01/2019 Today's weight: 215 lbs Today's date: 03/01/2020 Total lbs lost to date: 24 Total lbs lost since last in-office visit: 5  Interim History: [name] has had a good few weeks food wise but still cant finish all the food on the plan. She is adding cottage cheese or cheese to food to increase protein in her food. She didn't feels she has indulged due to emotional eating.  Subjective:   1. Other depression, with emotional eating [name] sees Psychiatry, and her next appointment is in 1 week. She is not on medications and she denies suicidal ideas or homicidal ideas.  2. Stage 3a chronic kidney disease [name]'s creatinine was 1.24. She sees a Tax adviser at [facility].  Assessment/Plan:   1. Other depression, with emotional eating Behavior modification techniques were discussed today to help [name] deal with her emotional/non-hunger eating behaviors. She will continue with Psychiatry and may want to consider treatment with [facility]. Orders and follow up as documented in patient record.   2. Stage 3a chronic kidney disease We will check labs today. [name] will continue to follow up as directed.  - Basic metabolic panel  3. Class 3 severe obesity with serious comorbidity and body mass index (BMI) of 40.0 to 44.9 in adult, unspecified obesity type (HCC) [name] is currently in the action stage of change. As such, her goal is to continue with weight loss efforts. She has agreed to the Category 3 Plan.   Exercise goals: All adults should avoid inactivity. Some physical activity is better than none, and adults who  participate in any amount of physical activity gain some health benefits.  Behavioral modification strategies: increasing lean protein intake, meal planning and cooking strategies, keeping healthy foods in the home and emotional eating strategies.  [name] has agreed to follow-up with our clinic in 3 weeks. She was informed of the importance of frequent follow-up visits to maximize her success with intensive lifestyle modifications for her multiple health conditions.   [name] was informed we would discuss her lab results at her next visit unless there is a critical issue that needs to be addressed sooner. [name] agreed to keep her next visit at the agreed upon time to discuss these results.  Objective:   Blood pressure 96/62, pulse 83, temperature 98.6 F (37 C), temperature source Oral, height 5\' 1"  (1.549 m), weight 215 lb (97.5 kg), SpO2 95 %. Body mass index is 40.62 kg/m.  General: Cooperative, alert, well developed, in no acute distress. HEENT: Conjunctivae and lids unremarkable. Cardiovascular: Regular rhythm.  Lungs: Normal work of breathing. Neurologic: No focal deficits.   Lab Results  Component Value Date   CREATININE 1.11 (H) 03/01/2020   BUN 20 03/01/2020   NA 142 03/01/2020   K 4.5 03/01/2020   CL 104 03/01/2020   CO2 23 03/01/2020   Lab Results  Component Value Date   ALT 19 02/10/2020   AST 20 02/10/2020   ALKPHOS 117 12/01/2019   BILITOT 0.5 02/10/2020   Lab Results  Component Value Date   HGBA1C 6.2 (H) 12/01/2019   HGBA1C 6.1 (H) 08/26/2019  HGBA1C 5.6 08/20/2018   HGBA1C 5.4 03/21/2018   HGBA1C 6.1 (H) 11/26/2017   Lab Results  Component Value Date   INSULIN 32.3 (H) 12/01/2019   Lab Results  Component Value Date   TSH 1.550 12/01/2019   Lab Results  Component Value Date   CHOL 137 12/01/2019   HDL 44 12/01/2019   LDLCALC 58 12/01/2019   TRIG 213 (H) 12/01/2019   CHOLHDL 3.5 08/26/2019   Lab Results  Component Value Date   WBC 6.3  02/10/2020   HGB 13.4 02/10/2020   HCT 40.6 02/10/2020   MCV 87.7 02/10/2020   PLT 176 02/10/2020   No results found for: IRON, TIBC, FERRITIN  Obesity Behavioral Intervention Documentation for Insurance:   Approximately 15 minutes were spent on the discussion below.  ASK: We discussed the diagnosis of obesity with [name] today and [name] agreed to give [place] permission to discuss obesity behavioral modification therapy today.  ASSESS: [name] has the diagnosis of obesity and her BMI today is 40.64. [name] is in the action stage of change.   ADVISE: [name] was educated on the multiple health risks of obesity as well as the benefit of weight loss to improve her health. She was advised of the need for long term treatment and the importance of lifestyle modifications to improve her current health and to decrease her risk of future health problems.  AGREE: Multiple dietary modification options and treatment options were discussed and [name] agreed to follow the recommendations documented in the above note.  ARRANGE: [name] was educated on the importance of frequent visits to treat obesity as outlined per CMS and USPSTF guidelines and agreed to schedule her next follow up appointment today.  Attestation Statements:   Reviewed by clinician on day of visit: allergies, medications, problem list, medical history, surgical history, family history, social history, and previous encounter notes.   I, Trixie Dredge, am acting as transcriptionist for Coralie Common, MD.  I have reviewed the above documentation for accuracy and completeness, and I agree with the above. - Jinny Blossom, MD

## 2020-03-11 ENCOUNTER — Ambulatory Visit (INDEPENDENT_AMBULATORY_CARE_PROVIDER_SITE_OTHER): Payer: PPO | Admitting: Psychologist

## 2020-03-11 DIAGNOSIS — F320 Major depressive disorder, single episode, mild: Secondary | ICD-10-CM

## 2020-03-17 ENCOUNTER — Encounter: Payer: Self-pay | Admitting: Physician Assistant

## 2020-03-17 ENCOUNTER — Other Ambulatory Visit: Payer: Self-pay

## 2020-03-17 ENCOUNTER — Ambulatory Visit (INDEPENDENT_AMBULATORY_CARE_PROVIDER_SITE_OTHER): Payer: PPO | Admitting: Physician Assistant

## 2020-03-17 VITALS — BP 121/65 | HR 60 | Ht 61.0 in | Wt 221.5 lb

## 2020-03-17 DIAGNOSIS — I10 Essential (primary) hypertension: Secondary | ICD-10-CM | POA: Diagnosis not present

## 2020-03-17 DIAGNOSIS — G629 Polyneuropathy, unspecified: Secondary | ICD-10-CM | POA: Diagnosis not present

## 2020-03-17 DIAGNOSIS — Z6841 Body Mass Index (BMI) 40.0 and over, adult: Secondary | ICD-10-CM | POA: Diagnosis not present

## 2020-03-17 DIAGNOSIS — E782 Mixed hyperlipidemia: Secondary | ICD-10-CM

## 2020-03-17 DIAGNOSIS — N1831 Chronic kidney disease, stage 3a: Secondary | ICD-10-CM

## 2020-03-17 DIAGNOSIS — I5032 Chronic diastolic (congestive) heart failure: Secondary | ICD-10-CM | POA: Diagnosis not present

## 2020-03-17 NOTE — Progress Notes (Signed)
Established Patient Office Visit  Subjective:  Patient ID: [name], female    DOB: 05/15/1951  Age: 69 y.o. MRN: [id number]  CC:  Chief Complaint  Patient presents with  . Hypertension  . Hyperlipidemia    HPI [name] presents for follow-up on hypertension and hyperlipidemia.  Patient has no acute concerns today.  HTN, CHF: Followed by cardiology. Pt denies new onset of chest pain, palpitations, dizziness or lower extremity swelling. Pt has chronic shortness of breath and is currently taking 1 tablet of furosemide once daily due to her worsening renal function (reports she has established with a nephrologist at [facility]), and dyspnea is manageable.  Followed by cardiology. Taking medication as directed without side effects. Checks BP at home sometimes.   HLD: Reports medication compliance.  Denies side effects. She follows a meal plan as recommended by weight loss program.  Neuropathy: Reports her symptoms are tolerable.  She is followed by neurology.  Obesity: Followed by healthy weight and wellness program for weight loss.   Past Medical History:  Diagnosis Date  . Arthritis    Rheumatoid arthritis,   . Back pain   . Blood transfusion    1981  . CHF (congestive heart failure) ([place])   . Chronic diastolic CHF (congestive heart failure) (H[facility])    diastolic   . DDD (degenerative disc disease), cervical   . DDD (degenerative disc disease), lumbar   . Edema of both lower extremities   . Fibromyalgia   . Gallbladder problem   . GERD (gastroesophageal reflux disease)   . Heart murmur    as a child  . Hiatal hernia    sjorgens syndrome  . High blood pressure   . High cholesterol   . IBS (irritable bowel syndrome)   . Joint pain   . Numbness and tingling of both feet   . Numbness of fingers   . OSA (obstructive sleep apnea)   . Osteoarthritis   . Peripheral autonomic neuropathy of unknown cause   . Pneumonia 07/2018  . Polyneuropathy   .  Pre-diabetes   . PVC (premature ventricular contraction)   . Raynaud disease   . Rheumatoid arthritis ([place])   . Sjogren's disease ([place])   . Sleep apnea   . SOB (shortness of breath)   . Swallowing difficulty   . Urticaria   . Vitamin D deficiency     Past Surgical History:  Procedure Laterality Date  . ABDOMINAL HYSTERECTOMY     BTL, BSO  . ADENOIDECTOMY    . BREAST SURGERY     mass removal   . CHOLECYSTECTOMY    . dental implants    . DIAGNOSTIC LAPAROSCOPY     x3  . KNEE ARTHROSCOPY     x2  . MASS EXCISION Left 03/22/2017   Procedure: EXCISION OF LEFT BREAST MASS;  Surgeon: Donnie Mesa, MD;  Location: [facility];  Service: General;  Laterality: Left;  . patotid cystectomy    . RIGHT/LEFT HEART CATH AND CORONARY ANGIOGRAPHY N/A 07/25/2019   Procedure: RIGHT/LEFT HEART CATH AND CORONARY ANGIOGRAPHY;  Surgeon: Jolaine Artist, MD;  Location: [facility];  Service: Cardiovascular;  Laterality: N/A;  . TENDON REPAIR  1980   left ankle and tibia  . TONSILLECTOMY    . TOTAL KNEE ARTHROPLASTY  06/21/2011   Procedure: TOTAL KNEE ARTHROPLASTY;  Surgeon: Gearlean Alf;  Location: [facility];  Service: Orthopedics;  Laterality: Right;  . TUBAL LIGATION  Family History  Problem Relation Age of Onset  . Alzheimer's disease Mother   . Heart attack Mother   . Hypertension Mother   . Glaucoma Mother   . High Cholesterol Mother   . Heart disease Mother   . Depression Mother   . Cancer Father   . High Cholesterol Father   . Heart disease Father   . Sleep apnea Father   . Heart attack Brother   . Heart disease Brother   . Glaucoma Brother   . Hyperlipidemia Brother   . Glaucoma Brother   . Asthma Son   . Allergic rhinitis Neg Hx   . Angioedema Neg Hx   . Eczema Neg Hx   . Urticaria Neg Hx     Social History   Socioeconomic History  . Marital status: Widowed    Spouse name: Not on file  . Number of children: 2  . Years of education: AS  . Highest education  level: Not on file  Occupational History  . Occupation: retired Marine scientist  . Occupation: Therapist, sports  Tobacco Use  . Smoking status: Never Smoker  . Smokeless tobacco: Never Used  Vaping Use  . Vaping Use: Never used  Substance and Sexual Activity  . Alcohol use: Yes    Comment: socially   . Drug use: No  . Sexual activity: Never  Other Topics Concern  . Not on file  Social History Narrative   Lives at home alone   Right-handed   Drinks 1 or less cups of coffee and 2 servings of either tea or soda per day   Social Determinants of Health   Financial Resource Strain:   . Difficulty of Paying Living Expenses: Not on file  Food Insecurity:   . Worried About Charity fundraiser in the Last Year: Not on file  . Ran Out of Food in the Last Year: Not on file  Transportation Needs:   . Lack of Transportation (Medical): Not on file  . Lack of Transportation (Non-Medical): Not on file  Physical Activity:   . Days of Exercise per Week: Not on file  . Minutes of Exercise per Session: Not on file  Stress:   . Feeling of Stress : Not on file  Social Connections:   . Frequency of Communication with Friends and Family: Not on file  . Frequency of Social Gatherings with Friends and Family: Not on file  . Attends Religious Services: Not on file  . Active Member of Clubs or Organizations: Not on file  . Attends Archivist Meetings: Not on file  . Marital Status: Not on file  Intimate Partner Violence:   . Fear of Current or Ex-Partner: Not on file  . Emotionally Abused: Not on file  . Physically Abused: Not on file  . Sexually Abused: Not on file    Outpatient Medications Prior to Visit  Medication Sig Dispense Refill  . aspirin EC 81 MG tablet Take 81 mg by mouth at bedtime.    [name] atorvastatin (LIPITOR) 20 MG tablet Take 1 tablet (20 mg total) by mouth daily. 60 tablet 0  . Calcium Carbonate-Vit D-Min (GNP CALCIUM 1200) 1200-1000 MG-UNIT CHEW Calcium 1200mg  qd OTC 30 tablet   .  cycloSPORINE (RESTASIS) 0.05 % ophthalmic emulsion Place 2 drops into both eyes 2 (two) times daily.     . diphenoxylate-atropine (LOMOTIL) 2.5-0.025 MG per tablet Take 1 tablet by mouth 4 (four) times daily as needed for diarrhea or loose stools.    [name]  fluticasone (FLONASE) 50 MCG/ACT nasal spray Use 1 spray per nostril 1-2 times daily as needed 16 g 5  . gabapentin (NEURONTIN) 300 MG capsule TAKE 2 CAPSULES BY MOUTH IN THE MORNING AND 3 CAPSULES AT BEDTIME (Patient taking differently: TAKE 2 CAPSULES BY MOUTH IN THE MORNING AND 2 CAPSULES AT BEDTIME) 450 capsule 1  . hydroxychloroquine (PLAQUENIL) 200 MG tablet TAKE 1 TABLET BY MOUTH TWICE A DAY MONDAY THROUGH FRIDAY. 120 tablet 0  . hyoscyamine (ANASPAZ) 0.125 MG TBDP disintergrating tablet Place 0.25 mg under the tongue every 6 (six) hours as needed for cramping.     . leflunomide (ARAVA) 20 MG tablet TAKE 1 TABLET BY MOUTH DAILY. 90 tablet 0  . levocetirizine (XYZAL) 5 MG tablet TAKE 1 TABLET BY MOUTH EVERY EVENING. 30 tablet 5  . lidocaine (LIDODERM) 5 % PLACE 1 PATCH ONTO THE SKIN DAILY AS NEEDED FOR PAIN. REMOVE AND DISCARD PATCH WITHIN 12 HOURS OR AS DIRECTED BY MD 90 patch 0  . metoprolol succinate (TOPROL-XL) 25 MG 24 hr tablet TAKE 1 TABLET BY MOUTH DAILY. PLEASE KEEP UPCOMING APPT WITH DR. Radford Pax IN [facility] FOR FUTURE REFILLS. THANK YOU 90 tablet 3  . NONFORMULARY OR COMPOUNDED ITEM Triamcinolone 0.1% & Silvadene cream 1:1- Apply as directed to affected areas as needed    . potassium chloride (KLOR-CON) 10 MEQ tablet Take 2 tablets (20 mEq total) by mouth daily. 186 tablet 3  . Probiotic Product (PROBIOTIC DAILY PO) Take by mouth. Ultraflora IB Probiotic    . promethazine (PHENERGAN) 25 MG tablet TAKE 1 TABLET BY MOUTH EVERY 6 HOURS AS NEEDED. 120 tablet 0  . tiZANidine (ZANAFLEX) 4 MG tablet TAKE 1 TABLET BY MOUTH AT BEDTIME. 90 tablet 0  . torsemide (DEMADEX) 20 MG tablet Take 1 tablet (20 mg total) by mouth 2 (two) times daily. 60 tablet  3  . UNABLE TO FIND CPAP: AT bedtime; setting is "12"    . methocarbamol (ROBAXIN) 500 MG tablet Take 1 tablet (500 mg total) by mouth daily as needed for muscle spasms. 30 tablet 0  . spironolactone (ALDACTONE) 25 MG tablet Take 1 tablet (25 mg total) by mouth 2 (two) times daily. 180 tablet 1  . Vitamin D, Ergocalciferol, (DRISDOL) 1.25 MG (50000 UNIT) CAPS capsule TAKE 1 CAPSULE BY MOUTH EVERY 7 DAYS. 8 capsule 0   No facility-administered medications prior to visit.    Allergies  Allergen Reactions  . Sulfa Antibiotics Hives and Itching    Flushing also  . Cymbalta [Duloxetine Hcl] Other (See Comments)    Caused a manic reaction  . Demerol Hives and Other (See Comments)    Fever also  . Ivp Dye [Iodinated Diagnostic Agents] Hives and Other (See Comments)    Fever also  . Morphine And Related Other (See Comments)    ineffective  . Sulfasalazine Other (See Comments)    Reaction ??  . Adhesive [Tape] Rash    ROS Review of Systems Review of Systems:  A fourteen system review of systems was performed and found to be positive as per HPI.  Objective:    Physical Exam General:  Well Developed, well nourished, appropriate for stated age.  Neuro:  Alert and oriented,  extra-ocular muscles intact  HEENT:  Normocephalic, atraumatic, neck supple Skin:  no gross rash, warm, pink. Cardiac:  RRR, S1 S2 Respiratory:  ECTA B/L, Not using accessory muscles, speaking in full sentences- unlabored. Vascular:  Ext warm, no cyanosis apprec.; cap RF less 2 sec.  Psych:  No HI/SI, judgement and insight good, Euthymic mood. Full Affect.  BP 121/65   Pulse 60   Ht 5\' 1"  (1.549 m)   Wt 221 lb 8 oz (100.5 kg)   SpO2 95%   BMI 41.85 kg/m  Wt Readings from Last 3 Encounters:  04/01/20 220 lb 9.6 oz (100.1 kg)  03/22/20 215 lb (97.5 kg)  03/17/20 221 lb 8 oz (100.5 kg)     Health Maintenance Due  Topic Date Due  . INFLUENZA VACCINE  03/14/2020    There are no preventive care reminders  to display for this patient.  Lab Results  Component Value Date   TSH 1.550 12/01/2019   Lab Results  Component Value Date   WBC 6.3 02/10/2020   HGB 13.4 02/10/2020   HCT 40.6 02/10/2020   MCV 87.7 02/10/2020   PLT 176 02/10/2020   Lab Results  Component Value Date   NA 142 03/01/2020   K 4.5 03/01/2020   CO2 23 03/01/2020   GLUCOSE 125 (H) 03/01/2020   BUN 20 03/01/2020   CREATININE 1.11 (H) 03/01/2020   BILITOT 0.5 02/10/2020   ALKPHOS 117 12/01/2019   AST 20 02/10/2020   ALT 19 02/10/2020   PROT 6.1 02/10/2020   ALBUMIN 4.1 12/01/2019   CALCIUM 10.0 03/01/2020   ANIONGAP 13 08/05/2019   GFR 72.81 04/27/2015   Lab Results  Component Value Date   CHOL 137 12/01/2019   Lab Results  Component Value Date   HDL 44 12/01/2019   Lab Results  Component Value Date   LDLCALC 58 12/01/2019   Lab Results  Component Value Date   TRIG 213 (H) 12/01/2019   Lab Results  Component Value Date   CHOLHDL 3.5 08/26/2019   Lab Results  Component Value Date   HGBA1C 6.2 (H) 12/01/2019      Assessment & Plan:   Problem List Items Addressed This Visit      Cardiovascular and Mediastinum   Chronic diastolic CHF (congestive heart failure) (HCC) (Chronic)     Nervous and Auditory   Peripheral polyneuropathy     Other   Mixed hyperlipidemia    Other Visit Diagnoses    Hypertension, unspecified type    -  Primary   Stage 3a chronic kidney disease       Class 3 severe obesity with serious comorbidity and body mass index (BMI) of 40.0 to 44.9 in adult, unspecified obesity type ([place])         Hypertension: -BP stable -Continue current medication regimen. -Follow low-sodium diet. -Continue with dietary and lifestyle modifications for weight loss.  Mixed hyperlipidemia: -Last lipid panel: Total cholesterol 137, triglycerides 213, LDL 58, HDL 44 -Continue atorvastatin 20 mg -Follow a heart healthy diet and stay as active as possible. -Plan to recheck lipid panel  and hepatic function next OV.  CKD: -Most recent creatinine 1.11, GFR 52 improved from prior -Continue to follow-up with Nephrology. -Recommend to continue with current dose of torsemide and avoid nephrotoxic substances such as NSAIDs. -Stay well-hydrated.  Chronic diastolic CHF: -Stable. -Followed by cardiology. -Continue current medication regimen.  Peripheral polyneuropathy: -Followed by neurology. -Continue current medication regimen.  Class III severe obesity with serious comorbidity and BMI of 40.0-44.9 in adult: -Followed by weight management center. -Reviewed last OV, patient has lost 24 pounds since starting the program. -Continue with current treatment plan.  No orders of the defined types were placed in this encounter.   Follow-up: Return for HLD, Mood,  PreDM in 3-4 months and FBW (lipid panel, cmp, a1c, vit d).    Lorrene Reid, PA-C

## 2020-03-18 NOTE — Progress Notes (Signed)
Office Visit Note  Patient: [name]             Date of Birth: 1950-10-12           MRN: [id number]             PCP: Lorrene Reid, PA-C Referring: Mellody Dance, DO Visit Date: 04/01/2020 Occupation: @GUAROCC @  Subjective:  Medication management.   History of Present Illness: [name] is a 69 y.o. female with history of rheumatoid arthritis, Sjogren's, osteoarthritis and fibromyalgia.  She states she continues to have pain and stiffness in her joints.  She has not noticed any swelling.  She continues to have dry mouth and dry eyes.  Her right knee replacement is doing well.  She has some low back pain.  Activities of Daily Living:  Patient reports morning stiffness for 30  minutes.   Patient Reports nocturnal pain.  Difficulty dressing/grooming: Reports Difficulty climbing stairs: Reports Difficulty getting out of chair: Reports Difficulty using hands for taps, buttons, cutlery, and/or writing: Reports  Review of Systems  Constitutional: Positive for fatigue.  HENT: Positive for mouth dryness and nose dryness. Negative for mouth sores.   Eyes: Positive for dryness. Negative for itching.  Respiratory: Negative for shortness of breath and difficulty breathing.   Cardiovascular: Negative for chest pain and palpitations.  Gastrointestinal: Positive for diarrhea. Negative for blood in stool and constipation.  Endocrine: Negative for increased urination.  Genitourinary: Negative for difficulty urinating.  Musculoskeletal: Positive for arthralgias, joint pain, morning stiffness and muscle tenderness. Negative for joint swelling, myalgias and myalgias.  Skin: Positive for color change. Negative for rash and redness.  Allergic/Immunologic: Negative for susceptible to infections.  Neurological: Positive for numbness. Negative for dizziness, headaches, memory loss and weakness.  Hematological: Negative for bruising/bleeding tendency.  Psychiatric/Behavioral: Negative  for confusion.    PMFS History:  Patient Active Problem List   Diagnosis Date Noted  . Post-menopausal 09/02/2019  . Pain in left knee 08/23/2018  . Lumbar pain 07/04/2018  . Recurrent urticaria 05/20/2018  . Rhinitis 05/20/2018  . Vitamin D deficiency 11/20/2017  . Mixed hyperlipidemia 11/20/2017  . Family history of coronary arteriosclerosis- strong fam h/o CAD and early CAD.  07/17/2017  . Raynaud's disease without gangrene 03/12/2017  . Abnormal weight gain 02/06/2017  . Shingles outbreak 02/06/2017  . Cyst (solitary) of breast, left 01/31/2017  . Eosinophilic esophagitis 81/85/6314  . History of hyperlipidemia 10/04/2016  . 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 History of diverticulitis 09/03/2016  . Osteoporosis 09/03/2016  . Autoimmune disease ([place]) 09/02/2016  . High risk medication use 09/02/2016  . History of esophagitis 09/02/2016  . Elevated triglycerides with high cholesterol 08/23/2016  . Low serum HDL 08/23/2016  . Breast cyst, left 06/14/2016  . Encounter for wellness examination 05/23/2016  . Abnormality of gait 05/09/2016  . Sjoegren syndrome 02/29/2016  . GERD (gastroesophageal reflux disease) 01/19/2016  . Glucose intolerance (impaired glucose tolerance) 01/19/2016  . Chronic diastolic CHF (congestive heart failure) ([place]) 04/20/2015  . Peripheral polyneuropathy 02/02/2014  . Morbid obesity ([place]) 11/17/2013  . Heart murmur   . OSA (obstructive sleep apnea)   . Rheumatoid arthritis ([place])   . Hiatal hernia   . Fibromyalgia   . PVC's (premature ventricular contractions)   . 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 Unilateral primary osteoarthritis, left knee 06/22/2011    Past Medical History:  Diagnosis Date  . Arthritis    Rheumatoid arthritis,   .  Back pain   . Blood transfusion    1981  . CHF (congestive heart failure) ([place])   . Chronic diastolic CHF (congestive heart failure) (HCC)    diastolic   . DDD (degenerative  disc disease), cervical   . DDD (degenerative disc disease), lumbar   . Edema of both lower extremities   . Fibromyalgia   . Gallbladder problem   . GERD (gastroesophageal reflux disease)   . Heart murmur    as a child  . Hiatal hernia    sjorgens syndrome  . High blood pressure   . High cholesterol   . IBS (irritable bowel syndrome)   . Joint pain   . Numbness and tingling of both feet   . Numbness of fingers   . OSA (obstructive sleep apnea)   . Osteoarthritis   . Peripheral autonomic neuropathy of unknown cause   . Pneumonia 07/2018  . Polyneuropathy   . Pre-diabetes   . PVC (premature ventricular contraction)   . Raynaud disease   . Rheumatoid arthritis ([place])   . Sjogren's disease ([place])   . Sleep apnea   . SOB (shortness of breath)   . Swallowing difficulty   . Urticaria   . Vitamin D deficiency     Family History  Problem Relation Age of Onset  . Alzheimer's disease Mother   . Heart attack Mother   . Hypertension Mother   . Glaucoma Mother   . High Cholesterol Mother   . Heart disease Mother   . Depression Mother   . Cancer Father   . High Cholesterol Father   . Heart disease Father   . Sleep apnea Father   . Heart attack Brother   . Heart disease Brother   . Glaucoma Brother   . Hyperlipidemia Brother   . Glaucoma Brother   . Asthma Son   . Allergic rhinitis Neg Hx   . Angioedema Neg Hx   . Eczema Neg Hx   . Urticaria Neg Hx    Past Surgical History:  Procedure Laterality Date  . ABDOMINAL HYSTERECTOMY     BTL, BSO  . ADENOIDECTOMY    . BREAST SURGERY     mass removal   . CHOLECYSTECTOMY    . dental implants    . DIAGNOSTIC LAPAROSCOPY     x3  . KNEE ARTHROSCOPY     x2  . MASS EXCISION Left 03/22/2017   Procedure: EXCISION OF LEFT BREAST MASS;  Surgeon: Donnie Mesa, MD;  Location: [facility];  Service: General;  Laterality: Left;  . patotid cystectomy    . RIGHT/LEFT HEART CATH AND CORONARY ANGIOGRAPHY N/A 07/25/2019   Procedure: RIGHT/LEFT  HEART CATH AND CORONARY ANGIOGRAPHY;  Surgeon: Jolaine Artist, MD;  Location: [facility];  Service: Cardiovascular;  Laterality: N/A;  . TENDON REPAIR  1980   left ankle and tibia  . TONSILLECTOMY    . TOTAL KNEE ARTHROPLASTY  06/21/2011   Procedure: TOTAL KNEE ARTHROPLASTY;  Surgeon: Gearlean Alf;  Location: [facility];  Service: Orthopedics;  Laterality: Right;  . TUBAL LIGATION     Social History   Social History Narrative   Lives at home alone   Right-handed   Drinks 1 or less cups of coffee and 2 servings of either tea or soda per day   Immunization History  Administered Date(s) Administered  . Influenza, High Dose Seasonal PF 06/12/2018, 05/17/2019  . Influenza,inj,Quad PF,6+ Mos 05/23/2016, 06/01/2017  . Influenza-Unspecified 06/05/2018, 05/17/2019  .  PFIZER SARS-COV-2 Vaccination 09/03/2019, 09/24/2019, 04/01/2020  . Pneumococcal Conjugate-13 07/02/2017  . Pneumococcal Polysaccharide-23 05/17/2005  . Td 10/16/2007  . Tdap 03/21/2018  . Zoster Recombinat (Shingrix) 06/10/2019, 12/11/2019     Objective: Vital Signs: BP 110/68 (BP Location: Left Arm, Patient Position: Sitting, Cuff Size: Normal)   Pulse 70   Resp 16   Ht 5\' 1"  (1.549 m)   Wt 220 lb 9.6 oz (100.1 kg)   BMI 41.68 kg/m    Physical Exam Vitals and nursing note reviewed.  Constitutional:      Appearance: She is well-developed.  HENT:     Head: Normocephalic and atraumatic.  Eyes:     Conjunctiva/sclera: Conjunctivae normal.  Cardiovascular:     Rate and Rhythm: Normal rate and regular rhythm.     Heart sounds: Normal heart sounds.  Pulmonary:     Effort: Pulmonary effort is normal.     Breath sounds: Normal breath sounds.  Abdominal:     General: Bowel sounds are normal.     Palpations: Abdomen is soft.  Musculoskeletal:     Cervical back: Normal range of motion.  Lymphadenopathy:     Cervical: No cervical adenopathy.  Skin:    General: Skin is warm and dry.     Capillary Refill:  Capillary refill takes less than 2 seconds.  Neurological:     Mental Status: She is alert and oriented to person, place, and time.  Psychiatric:        Behavior: Behavior normal.      Musculoskeletal Exam: C-spine was in good range of motion.  She has marked scoliosis.  She had no point tenderness.  Shoulder joints, elbow joints, wrist joints were in good range of motion.  She has some PIP and DIP thickening some MCP thickening was noted.  She has mild tenderness over MCPs but no synovitis was noted.  The right total knee replacement is doing well.  She had no discomfort range of motion of her left knee joint.  There was no tenderness across MTPs.  Mild pedal edema was noted.  CDAI Exam: CDAI Score: 4.8  Patient Global: 5 mm; Provider Global: 3 mm Swollen: 0 ; Tender: 4  Joint Exam 04/01/2020      Right  Left  MCP 2   Tender   Tender  MCP 3   Tender   Tender     Investigation: No additional findings.  Imaging: No results found.  Recent Labs: Lab Results  Component Value Date   WBC 6.3 02/10/2020   HGB 13.4 02/10/2020   PLT 176 02/10/2020   NA 142 03/01/2020   K 4.5 03/01/2020   CL 104 03/01/2020   CO2 23 03/01/2020   GLUCOSE 125 (H) 03/01/2020   BUN 20 03/01/2020   CREATININE 1.11 (H) 03/01/2020   BILITOT 0.5 02/10/2020   ALKPHOS 117 12/01/2019   AST 20 02/10/2020   ALT 19 02/10/2020   PROT 6.1 02/10/2020   ALBUMIN 4.1 12/01/2019   CALCIUM 10.0 03/01/2020   GFRAA 59 (L) 03/01/2020    Speciality Comments: PLQ Eye Exam: 01/14/2020 [facility]@ [facility] follow up in 1 year  Procedures:  No procedures performed Allergies: Sulfa antibiotics, Cymbalta [duloxetine hcl], Demerol, Ivp dye [iodinated diagnostic agents], Morphine and related, Sulfasalazine, and Adhesive [tape]   Assessment / Plan:     Visit Diagnoses: Rheumatoid arthritis involving multiple sites with positive rheumatoid factor (HCC) - Positive RF, positive ANA.  -She continues to have some discomfort  in her joints.  No synovitis was noted.  She has some tenderness on palpation.  She had done better on Enbrel in the past but it was not covered by her insurance.  Plan: XR Hand 2 View Right, XR Hand 2 View Left, XR Foot 2 Views Right, XR Foot 2 Views Left.  X-rays are consistent with osteoarthritis.  No erosive changes were noted.  X-ray findings were reviewed with the patient.  High risk medication use - Arava 20 mg daily and Plaquenil 200 mg 1 tablet by mouth twice daily Monday through Friday. D/c IV orencia due to cost. PLQ Eye Exam: 01/14/2020.  Her labs have been stable.  We will monitor labs every 3 months.  Sjogren's syndrome with keratoconjunctivitis sicca (HCC)-she continues to have sicca symptoms.  Over-the-counter products were discussed.  Raynaud's disease without gangrene-warm clothing and keeping core temperature warm was discussed.  Chronic right shoulder pain - X-ray of the shoulder joint showed acromioclavicular arthritis.  She is currently not having much discomfort.  Unilateral primary osteoarthritis, left knee-pain is manageable.  History of total knee replacement, right-doing better.  Chronic SI joint pain-she continues to have some SI joint discomfort.  DDD (degenerative disc disease), lumbar - X-ray showed severe levoscoliosis and multilevel kissing osteophytes.  She has chronic lower back pain.  Fibromyalgia -she gets benefit from Zanaflex 4 mg by mouth at bedtime as needed.  She has generalized pain and discomfort.  Need for regular exercise was emphasized.  Age-related osteoporosis without current pathological fracture - The BMD measured at Femur Total Right is 0.782 g/cm2 with a T-scoreof -1.8. 08/2019.  DEXA findings were reviewed.  Use of calcium, vitamin D and resistive exercises was discussed.  Other medical problems are listed as follows:  History of cardiac murmur  Eosinophilic esophagitis  History of peripheral neuropathy  History of hyperlipidemia-she  is going to the weight loss clinic which has been helpful.  Weight loss diet and exercise was emphasized.  History of diabetes mellitus  History of depression  History of diverticulitis  Patient is fully immunized against COVID-19.  She had her booster today.  She was advised to contact us in case she develops COVID-19 vaccine as she may be a candidate for monoclonal antibody infusion.  Use of mask, social distancing and hand hygiene was discussed.  Orders: Orders Placed This Encounter  Procedures  . XR Hand 2 View Right  . XR Hand 2 View Left  . XR Foot 2 Views Right  . XR Foot 2 Views Left   No orders of the defined types were placed in this encounter.     Follow-Up Instructions: Return in about 5 months (around 09/01/2020) for Rheumatoid arthritis, Osteoarthritis.   Bo Merino, MD  Note - This record has been created using Editor, commissioning.  Chart creation errors have been sought, but may not always  have been located. Such creation errors do not reflect on  the standard of medical care.

## 2020-03-22 ENCOUNTER — Encounter (INDEPENDENT_AMBULATORY_CARE_PROVIDER_SITE_OTHER): Payer: Self-pay | Admitting: Family Medicine

## 2020-03-22 ENCOUNTER — Other Ambulatory Visit: Payer: Self-pay

## 2020-03-22 ENCOUNTER — Ambulatory Visit (INDEPENDENT_AMBULATORY_CARE_PROVIDER_SITE_OTHER): Payer: PPO | Admitting: Family Medicine

## 2020-03-22 VITALS — BP 143/77 | HR 56 | Temp 98.5°F | Ht 61.0 in | Wt 215.0 lb

## 2020-03-22 DIAGNOSIS — I5032 Chronic diastolic (congestive) heart failure: Secondary | ICD-10-CM | POA: Diagnosis not present

## 2020-03-22 DIAGNOSIS — Z6841 Body Mass Index (BMI) 40.0 and over, adult: Secondary | ICD-10-CM | POA: Diagnosis not present

## 2020-03-22 NOTE — Progress Notes (Signed)
Chief Complaint:   OBESITY [name] is here to discuss her progress with her obesity treatment plan along with follow-up of her obesity related diagnoses. [name] is on the Category 3 Plan and states she is following her eating plan approximately 85% of the time. [name] states she is walking for 4 times per week.  Today's visit was #: 7 Starting weight: 239 lbs Starting date: 12/01/2019 Today's weight: 215 lbs Today's date: 03/23/2020 Total lbs lost to date: 24 Total lbs lost since last in-office visit: 0  Interim History: [name] struggles with lack of appetite. She finds it hard to eat all of the food on the plan. She is trying to eat more protein. She generally drinks one soda daily and occasionally has sweet tea.  Subjective:   1. Diastolic congestive heart failure, unspecified HF chronicity ([place]) [name] takes one Demadex 20 mg daily and spironolactone BID. She denies chest pain but she notes shortness of breath with exertion but better than it was. She notes decreased lower extremity edema. It has much improved with loss of 34 lbs. She had lost 10 lbs before starting our program.   Assessment/Plan:   1. Diastolic congestive heart failure, unspecified HF chronicity (HCC) [name] will continue diuretics and will follow up with Cardiology as directed.  2. Class 3 severe obesity with serious comorbidity and body mass index (BMI) of 40.0 to 44.9 in adult, unspecified obesity type (HCC) [name] is currently in the action stage of change. As such, her goal is to continue with weight loss efforts. She has agreed to the Category 2 Plan or the Category 3 Plan.  We discussed that drinking soda could be decreasing her appetite.  Handouts given today: Protein Substitutions and Category 2.  Exercise goals: As is.  Behavioral modification strategies: increasing lean protein intake, decreasing liquid calories and planning for success.  [name] has agreed to follow-up with our clinic in 2 to 3 weeks. She was  informed of the importance of frequent follow-up visits to maximize her success with intensive lifestyle modifications for her multiple health conditions.   Objective:   Blood pressure (!) 143/77, pulse (!) 56, temperature 98.5 F (36.9 C), temperature source Oral, height 5\' 1"  (1.549 m), weight 215 lb (97.5 kg), SpO2 94 %. Body mass index is 40.62 kg/m.  General: Cooperative, alert, well developed, in no acute distress. HEENT: Conjunctivae and lids unremarkable. Cardiovascular: Regular rhythm.  Lungs: Normal work of breathing. Neurologic: No focal deficits.   Lab Results  Component Value Date   CREATININE 1.11 (H) 03/01/2020   BUN 20 03/01/2020   NA 142 03/01/2020   K 4.5 03/01/2020   CL 104 03/01/2020   CO2 23 03/01/2020   Lab Results  Component Value Date   ALT 19 02/10/2020   AST 20 02/10/2020   ALKPHOS 117 12/01/2019   BILITOT 0.5 02/10/2020   Lab Results  Component Value Date   HGBA1C 6.2 (H) 12/01/2019   HGBA1C 6.1 (H) 08/26/2019   HGBA1C 5.6 08/20/2018   HGBA1C 5.4 03/21/2018   HGBA1C 6.1 (H) 11/26/2017   Lab Results  Component Value Date   INSULIN 32.3 (H) 12/01/2019   Lab Results  Component Value Date   TSH 1.550 12/01/2019   Lab Results  Component Value Date   CHOL 137 12/01/2019   HDL 44 12/01/2019   LDLCALC 58 12/01/2019   TRIG 213 (H) 12/01/2019   CHOLHDL 3.5 08/26/2019   Lab Results  Component Value Date   WBC 6.3  02/10/2020   HGB 13.4 02/10/2020   HCT 40.6 02/10/2020   MCV 87.7 02/10/2020   PLT 176 02/10/2020   No results found for: IRON, TIBC, FERRITIN  Obesity Behavioral Intervention Documentation for Insurance:   Approximately 15 minutes were spent on the discussion below.  ASK: We discussed the diagnosis of obesity with [name] today and [name] agreed to give [place] permission to discuss obesity behavioral modification therapy today.  ASSESS: [name] has the diagnosis of obesity and her BMI today is 40.64. [name] is in the action stage of  change.   ADVISE: [name] was educated on the multiple health risks of obesity as well as the benefit of weight loss to improve her health. She was advised of the need for long term treatment and the importance of lifestyle modifications to improve her current health and to decrease her risk of future health problems.  AGREE: Multiple dietary modification options and treatment options were discussed and Joselin agreed to follow the recommendations documented in the above note.  ARRANGE: [name] was educated on the importance of frequent visits to treat obesity as outlined per CMS and USPSTF guidelines and agreed to schedule her next follow up appointment today.  Attestation Statements:   Reviewed by clinician on day of visit: allergies, medications, problem list, medical history, surgical history, family history, social history, and previous encounter notes.   [name], am acting as Location manager for [employer], FNP-C.  I have reviewed the above documentation for accuracy and completeness, and I agree with the above. -  Marisa Fick, FNP

## 2020-03-23 ENCOUNTER — Encounter (INDEPENDENT_AMBULATORY_CARE_PROVIDER_SITE_OTHER): Payer: Self-pay | Admitting: Family Medicine

## 2020-03-25 ENCOUNTER — Ambulatory Visit: Payer: PPO | Admitting: Psychologist

## 2020-03-26 ENCOUNTER — Other Ambulatory Visit: Payer: Self-pay | Admitting: Rheumatology

## 2020-03-26 ENCOUNTER — Other Ambulatory Visit: Payer: Self-pay | Admitting: Physician Assistant

## 2020-03-26 ENCOUNTER — Other Ambulatory Visit: Payer: Self-pay | Admitting: Cardiology

## 2020-03-26 ENCOUNTER — Other Ambulatory Visit: Payer: Self-pay | Admitting: Allergy and Immunology

## 2020-03-26 DIAGNOSIS — I10 Essential (primary) hypertension: Secondary | ICD-10-CM

## 2020-03-26 DIAGNOSIS — I5032 Chronic diastolic (congestive) heart failure: Secondary | ICD-10-CM

## 2020-03-26 DIAGNOSIS — E559 Vitamin D deficiency, unspecified: Secondary | ICD-10-CM

## 2020-04-01 ENCOUNTER — Ambulatory Visit: Payer: Self-pay

## 2020-04-01 ENCOUNTER — Ambulatory Visit (INDEPENDENT_AMBULATORY_CARE_PROVIDER_SITE_OTHER): Payer: PPO | Admitting: Rheumatology

## 2020-04-01 ENCOUNTER — Other Ambulatory Visit: Payer: Self-pay

## 2020-04-01 ENCOUNTER — Encounter: Payer: Self-pay | Admitting: Rheumatology

## 2020-04-01 ENCOUNTER — Ambulatory Visit (INDEPENDENT_AMBULATORY_CARE_PROVIDER_SITE_OTHER): Payer: PPO

## 2020-04-01 VITALS — BP 110/68 | HR 70 | Resp 16 | Ht 61.0 in | Wt 220.6 lb

## 2020-04-01 DIAGNOSIS — M0579 Rheumatoid arthritis with rheumatoid factor of multiple sites without organ or systems involvement: Secondary | ICD-10-CM

## 2020-04-01 DIAGNOSIS — M25511 Pain in right shoulder: Secondary | ICD-10-CM

## 2020-04-01 DIAGNOSIS — Z96651 Presence of right artificial knee joint: Secondary | ICD-10-CM | POA: Diagnosis not present

## 2020-04-01 DIAGNOSIS — G8929 Other chronic pain: Secondary | ICD-10-CM

## 2020-04-01 DIAGNOSIS — K200 Eosinophilic esophagitis: Secondary | ICD-10-CM

## 2020-04-01 DIAGNOSIS — Z8679 Personal history of other diseases of the circulatory system: Secondary | ICD-10-CM | POA: Diagnosis not present

## 2020-04-01 DIAGNOSIS — M1712 Unilateral primary osteoarthritis, left knee: Secondary | ICD-10-CM

## 2020-04-01 DIAGNOSIS — Z8719 Personal history of other diseases of the digestive system: Secondary | ICD-10-CM

## 2020-04-01 DIAGNOSIS — I7300 Raynaud's syndrome without gangrene: Secondary | ICD-10-CM

## 2020-04-01 DIAGNOSIS — M5136 Other intervertebral disc degeneration, lumbar region: Secondary | ICD-10-CM

## 2020-04-01 DIAGNOSIS — Z8669 Personal history of other diseases of the nervous system and sense organs: Secondary | ICD-10-CM

## 2020-04-01 DIAGNOSIS — M3501 Sicca syndrome with keratoconjunctivitis: Secondary | ICD-10-CM | POA: Diagnosis not present

## 2020-04-01 DIAGNOSIS — M797 Fibromyalgia: Secondary | ICD-10-CM | POA: Diagnosis not present

## 2020-04-01 DIAGNOSIS — Z79899 Other long term (current) drug therapy: Secondary | ICD-10-CM

## 2020-04-01 DIAGNOSIS — M810 Age-related osteoporosis without current pathological fracture: Secondary | ICD-10-CM | POA: Diagnosis not present

## 2020-04-01 DIAGNOSIS — Z8659 Personal history of other mental and behavioral disorders: Secondary | ICD-10-CM

## 2020-04-01 DIAGNOSIS — Z8639 Personal history of other endocrine, nutritional and metabolic disease: Secondary | ICD-10-CM

## 2020-04-01 DIAGNOSIS — M533 Sacrococcygeal disorders, not elsewhere classified: Secondary | ICD-10-CM | POA: Diagnosis not present

## 2020-04-01 DIAGNOSIS — M51369 Other intervertebral disc degeneration, lumbar region without mention of lumbar back pain or lower extremity pain: Secondary | ICD-10-CM

## 2020-04-01 NOTE — Patient Instructions (Addendum)
COVID-19 vaccine recommendations:   COVID-19 vaccine is recommended for everyone (unless you are allergic to a vaccine component), even if you are on a medication that suppresses your immune system.   If you are on Methotrexate, Cellcept (mycophenolate), Rinvoq, Morrie Sheldon, and Olumiant- hold the medication for 1 week after each vaccine. Hold Methotrexate for 2 weeks after the single dose COVID-19 vaccine.   If you are on Orencia subcutaneous injection - hold medication one week prior to and one week after the first COVID-19 vaccine dose (only).   If you are on Orencia IV infusions- time vaccination administration so that the first COVID-19 vaccination will occur four weeks after the infusion and postpone the subsequent infusion by one week.   If you are on Cyclophosphamide or Rituxan infusions please contact your doctor prior to receiving the COVID-19 vaccine.   Do not take Tylenol or any anti-inflammatory medications (NSAIDs) 24 hours prior to the COVID-19 vaccination.   There is no direct evidence about the efficacy of the COVID-19 vaccine in individuals who are on medications that suppress the immune system.   Even if you are fully vaccinated, and you are on any medications that suppress your immune system, please continue to wear a mask, maintain at least six feet social distance and practice hand hygiene.   If you develop a COVID-19 infection, please contact your PCP or our office to determine if you need antibody infusion.  The booster vaccine is now available for immunocompromised patients. It is advised that if you had Pfizer vaccine you should get [employer] booster.  If you had a Moderna vaccine then you should get a Moderna booster. Johnson and Wynetta Emery does not have a booster vaccine at this time.  Please see the following web sites for updated information.    https://[email or web address]/[email or web address]/0/Files/COVID-19-Vaccination-Patient-Resources.pdf  [email or web address]://[email or web address]/About-Us/[email or web address]-Releases/ID/1159  Standing Labs We placed an order today for your standing lab work.   Please have your standing labs drawn in September and every 3 months  If possible, please have your labs drawn 2 weeks prior to your appointment so that the provider can discuss your results at your appointment.  We have open lab daily Monday through Thursday from 8:30-12:30 PM and 1:30-4:30 PM and Friday from 8:30-12:30 PM and 1:30-4:00 PM at the office of Dr. Bo Merino, [facility].   Please be advised, patients with office appointments requiring lab work will take precedents over walk-in lab work.  If possible, please come for your lab work on Monday and Friday afternoons, as you may experience shorter wait times. The office is located at [address] [place], [place], [place] [address] No appointment is necessary.   Labs are drawn by [facility]. Please bring your co-pay at the time of your lab draw.  You may receive a bill from [facility] for your lab work.  If you wish to have your labs drawn at another location, please call the office 24 hours in advance to send orders.  If you have any questions regarding directions or hours of operation,  please call [phone].   As a reminder, please drink plenty of water prior to coming for your lab work. Thanks!

## 2020-04-06 ENCOUNTER — Ambulatory Visit (INDEPENDENT_AMBULATORY_CARE_PROVIDER_SITE_OTHER): Payer: PPO | Admitting: Family Medicine

## 2020-04-07 ENCOUNTER — Ambulatory Visit (INDEPENDENT_AMBULATORY_CARE_PROVIDER_SITE_OTHER): Payer: PPO | Admitting: Psychologist

## 2020-04-07 ENCOUNTER — Other Ambulatory Visit: Payer: PPO

## 2020-04-07 ENCOUNTER — Other Ambulatory Visit: Payer: Self-pay

## 2020-04-07 DIAGNOSIS — F320 Major depressive disorder, single episode, mild: Secondary | ICD-10-CM

## 2020-04-07 DIAGNOSIS — Z20822 Contact with and (suspected) exposure to covid-19: Secondary | ICD-10-CM

## 2020-04-08 ENCOUNTER — Ambulatory Visit: Payer: PPO | Admitting: Psychologist

## 2020-04-08 LAB — SARS-COV-2, NAA 2 DAY TAT

## 2020-04-08 LAB — NOVEL CORONAVIRUS, NAA: SARS-CoV-2, NAA: NOT DETECTED

## 2020-04-09 ENCOUNTER — Ambulatory Visit
Admission: RE | Admit: 2020-04-09 | Discharge: 2020-04-09 | Disposition: A | Discharge status: Home / Self Care | Payer: PPO | Source: Ambulatory Visit | Attending: Emergency Medicine | Admitting: Emergency Medicine

## 2020-04-09 ENCOUNTER — Other Ambulatory Visit: Payer: Self-pay

## 2020-04-09 ENCOUNTER — Ambulatory Visit (INDEPENDENT_AMBULATORY_CARE_PROVIDER_SITE_OTHER): Payer: PPO

## 2020-04-09 ENCOUNTER — Telehealth: Payer: Self-pay | Admitting: Physician Assistant

## 2020-04-09 VITALS — BP 119/67 | HR 66 | Temp 99.1°F | Resp 18

## 2020-04-09 DIAGNOSIS — R05 Cough: Secondary | ICD-10-CM | POA: Diagnosis not present

## 2020-04-09 DIAGNOSIS — R5383 Other fatigue: Secondary | ICD-10-CM

## 2020-04-09 DIAGNOSIS — R059 Cough, unspecified: Secondary | ICD-10-CM

## 2020-04-09 MED ORDER — FLUTICASONE PROPIONATE 50 MCG/ACT NA SUSP: 1.0000 | Freq: Every day | NASAL | 0 refills | Status: DC

## 2020-04-09 MED ORDER — PREDNISONE 20 MG PO TABS: 20.0000 mg | ORAL_TABLET | Freq: Every day | ORAL | 0 refills | Status: DC

## 2020-04-09 MED ORDER — BENZONATATE 100 MG PO CAPS: 100.0000 mg | ORAL_CAPSULE | Freq: Three times a day (TID) | ORAL | 0 refills | Status: DC

## 2020-04-09 MED ORDER — CETIRIZINE HCL 10 MG PO TABS: 10.0000 mg | ORAL_TABLET | Freq: Every day | ORAL | 0 refills | Status: DC

## 2020-04-09 MED ORDER — ALBUTEROL SULFATE HFA 108 (90 BASE) MCG/ACT IN AERS: 2.0000 | INHALATION_SPRAY | RESPIRATORY_TRACT | 0 refills | Status: DC | PRN

## 2020-04-09 NOTE — ED Triage Notes (Signed)
Pt presents with complaints of cough, nasal drainage, and fatigue x 1 week. Reports being fully vaccinated. Reports having her booster covid vaccine on Thursday. Negative covid test today.

## 2020-04-09 NOTE — Telephone Encounter (Signed)
Patient states she has been coughing, nasal congestion, sore throat and yellow mucus x 1 week. States she had covid test that was negative and is requesting medication to be sent to pharmacy.   I advised patient we were unable to send any medication to pharmacy without seeing her for issue. Our office has no apts available today. Suggested patient go to UC for evaluation and treatment.   Patient verbalized understanding. AS, CMA

## 2020-04-09 NOTE — Telephone Encounter (Signed)
Patient called requesting a call back from clinic staff about some URI symptoms she has been having, and wants to have questions about care answered. Please contact when available.

## 2020-04-09 NOTE — ED Provider Notes (Signed)
[name]    CSN: [id number] Arrival date & time: 04/09/20  1651      History   Chief Complaint Chief Complaint  Patient presents with  . Appointment  . Cough    HPI [name] is a 69 y.o. female with history of DDD cervical, lumbar, fibromyalgia, scoliosis, OSA presenting for cough, nasal drainage, fatigue for the last week.  Is fully vaccinated: Received her booster on Thursday.  Tested negative for Covid yesterday.  No shortness of breath, chest pain, fever, known sick contacts.  Tried OTC cold medicines without relief.    Past Medical History:  Diagnosis Date  . Arthritis    Rheumatoid arthritis,   . Back pain   . Blood transfusion    1981  . CHF (congestive heart failure) ([name])   . Chronic diastolic CHF (congestive heart failure) (HCC)    diastolic   . DDD (degenerative disc disease), cervical   . DDD (degenerative disc disease), lumbar   . Edema of both lower extremities   . Fibromyalgia   . Gallbladder problem   . GERD (gastroesophageal reflux disease)   . Heart murmur    as a child  . Hiatal hernia    sjorgens syndrome  . High blood pressure   . High cholesterol   . IBS (irritable bowel syndrome)   . Joint pain   . Numbness and tingling of both feet   . Numbness of fingers   . OSA (obstructive sleep apnea)   . Osteoarthritis   . Peripheral autonomic neuropathy of unknown cause   . Pneumonia 07/2018  . Polyneuropathy   . Pre-diabetes   . PVC (premature ventricular contraction)   . Raynaud disease   . Rheumatoid arthritis ([place])   . Sjogren's disease ([place])   . Sleep apnea   . SOB (shortness of breath)   . Swallowing difficulty   . Urticaria   . Vitamin D deficiency     Patient Active Problem List   Diagnosis Date Noted  . Post-menopausal 09/02/2019  . Pain in left knee 08/23/2018  . Lumbar pain 07/04/2018  . Recurrent urticaria 05/20/2018  . Rhinitis 05/20/2018  . Vitamin D deficiency 11/20/2017  . Mixed hyperlipidemia  11/20/2017  . Family history of coronary arteriosclerosis- strong fam h/o CAD and early CAD.  07/17/2017  . Raynaud's disease without gangrene 03/12/2017  . Abnormal weight gain 02/06/2017  . Shingles outbreak 02/06/2017  . Cyst (solitary) of breast, left 01/31/2017  . Eosinophilic esophagitis 42/70/6237  . History of hyperlipidemia 10/04/2016  . 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 History of diverticulitis 09/03/2016  . Osteoporosis 09/03/2016  . Autoimmune disease ([place]) 09/02/2016  . High risk medication use 09/02/2016  . History of esophagitis 09/02/2016  . Elevated triglycerides with high cholesterol 08/23/2016  . Low serum HDL 08/23/2016  . Breast cyst, left 06/14/2016  . Encounter for wellness examination 05/23/2016  . Abnormality of gait 05/09/2016  . Sjoegren syndrome 02/29/2016  . GERD (gastroesophageal reflux disease) 01/19/2016  . Glucose intolerance (impaired glucose tolerance) 01/19/2016  . Chronic diastolic CHF (congestive heart failure) ([place]) 04/20/2015  . Peripheral polyneuropathy 02/02/2014  . Morbid obesity ([place]) 11/17/2013  . Heart murmur   . OSA (obstructive sleep apnea)   . Rheumatoid arthritis ([place])   . Hiatal hernia   . Fibromyalgia   . PVC's (premature ventricular contractions)   . 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 Unilateral primary osteoarthritis, left knee 06/22/2011  Past Surgical History:  Procedure Laterality Date  . ABDOMINAL HYSTERECTOMY     BTL, BSO  . ADENOIDECTOMY    . BREAST SURGERY     mass removal   . CHOLECYSTECTOMY    . dental implants    . DIAGNOSTIC LAPAROSCOPY     x3  . KNEE ARTHROSCOPY     x2  . MASS EXCISION Left 03/22/2017   Procedure: EXCISION OF LEFT BREAST MASS;  Surgeon: Donnie Mesa, MD;  Location: [facility];  Service: General;  Laterality: Left;  . patotid cystectomy    . RIGHT/LEFT HEART CATH AND CORONARY ANGIOGRAPHY N/A 07/25/2019   Procedure: RIGHT/LEFT HEART CATH AND CORONARY  ANGIOGRAPHY;  Surgeon: Jolaine Artist, MD;  Location: [facility];  Service: Cardiovascular;  Laterality: N/A;  . TENDON REPAIR  1980   left ankle and tibia  . TONSILLECTOMY    . TOTAL KNEE ARTHROPLASTY  06/21/2011   Procedure: TOTAL KNEE ARTHROPLASTY;  Surgeon: Gearlean Alf;  Location: [facility];  Service: Orthopedics;  Laterality: Right;  . TUBAL LIGATION      OB History    Gravida  3   Para  3   Term      Preterm      AB      Living        SAB      TAB      Ectopic      Multiple      Live Births               Home Medications    Prior to Admission medications   Medication Sig Start Date End Date Taking? Authorizing Provider  albuterol (VENTOLIN HFA) 108 (90 Base) MCG/ACT inhaler Inhale 2 puffs into the lungs every 4 (four) hours as needed for wheezing or shortness of breath. 04/09/20   Hall-Potvin, [place], PA-C  aspirin EC 81 MG tablet Take 81 mg by mouth at bedtime.    [provider]  atorvastatin (LIPITOR) 20 MG tablet Take 1 tablet (20 mg total) by mouth daily. 02/24/20   Lorrene Reid, PA-C  benzonatate (TESSALON) 100 MG capsule Take 1 capsule (100 mg total) by mouth every 8 (eight) hours. 04/09/20   Hall-Potvin, [place], PA-C  Calcium Carbonate-Vit D-Min (GNP CALCIUM 1200) 1200-1000 MG-UNIT CHEW Calcium 1200mg  qd OTC 09/02/19   Opalski, Neoma Laming, DO  cetirizine (ZYRTEC ALLERGY) 10 MG tablet Take 1 tablet (10 mg total) by mouth daily. 04/09/20   Hall-Potvin, [place], PA-C  cycloSPORINE (RESTASIS) 0.05 % ophthalmic emulsion Place 2 drops into both eyes 2 (two) times daily.     [provider]  diphenoxylate-atropine (LOMOTIL) 2.5-0.025 MG per tablet Take 1 tablet by mouth 4 (four) times daily as needed for diarrhea or loose stools.    [provider]  fluticasone (FLONASE) 50 MCG/ACT nasal spray Place 1 spray into both nostrils daily. 04/09/20   Hall-Potvin, [place], PA-C  gabapentin (NEURONTIN) 300 MG capsule TAKE 2  CAPSULES BY MOUTH IN THE MORNING AND 3 CAPSULES AT BEDTIME Patient taking differently: TAKE 2 CAPSULES BY MOUTH IN THE MORNING AND 2 CAPSULES AT BEDTIME 01/01/20   Ward Givens, NP  hydroxychloroquine (PLAQUENIL) 200 MG tablet TAKE 1 TABLET BY MOUTH TWICE A DAY MONDAY THROUGH FRIDAY. 02/10/20   Bo Merino, MD  hyoscyamine (ANASPAZ) 0.125 MG TBDP disintergrating tablet Place 0.25 mg under the tongue every 6 (six) hours as needed for cramping.     [provider]  leflunomide (ARAVA) 20  MG tablet TAKE 1 TABLET BY MOUTH DAILY. 02/10/20   Bo Merino, MD  levocetirizine (XYZAL) 5 MG tablet TAKE 1 TABLET BY MOUTH EVERY EVENING. 12/11/18   Bobbitt, Sedalia Muta, MD  lidocaine (LIDODERM) 5 % PLACE 1 PATCH ONTO THE SKIN DAILY AS NEEDED FOR PAIN. REMOVE AND DISCARD PATCH WITHIN 12 HOURS OR AS DIRECTED BY MD 10/09/19   Ward Givens, NP  methocarbamol (ROBAXIN) 500 MG tablet TAKE 1 TABLET BY MOUTH DAILY AS NEEDED FOR MUSCLE SPASMS. 03/26/20   Bo Merino, MD  metoprolol succinate (TOPROL-XL) 25 MG 24 hr tablet TAKE 1 TABLET BY MOUTH DAILY. PLEASE KEEP UPCOMING APPT WITH DR. Radford Pax IN [place] FOR FUTURE REFILLS. [place] YOU 02/10/20   Sueanne Margarita, MD  NONFORMULARY OR COMPOUNDED ITEM Triamcinolone 0.1% & Silvadene cream 1:1- Apply as directed to affected areas as needed    [provider]  NONFORMULARY OR COMPOUNDED ITEM     [provider]  potassium chloride (KLOR-CON) 10 MEQ tablet Take 2 tablets (20 mEq total) by mouth daily. 07/22/19   Bensimhon, Shaune Pascal, MD  predniSONE (DELTASONE) 20 MG tablet Take 1 tablet (20 mg total) by mouth daily. 04/09/20   Hall-Potvin, [place], PA-C  Probiotic Product (PROBIOTIC DAILY PO) Take by mouth. Ultraflora IB Probiotic    [provider]  promethazine (PHENERGAN) 25 MG tablet TAKE 1 TABLET BY MOUTH EVERY 6 HOURS AS NEEDED. 01/01/20   Bo Merino, MD  spironolactone (ALDACTONE) 25 MG tablet TAKE 1 TABLET BY MOUTH 2  TIMES DAILY. 03/26/20   Sueanne Margarita, MD  tiZANidine (ZANAFLEX) 4 MG tablet TAKE 1 TABLET BY MOUTH AT BEDTIME. 02/10/20   Bo Merino, MD  torsemide (DEMADEX) 20 MG tablet Take 1 tablet (20 mg total) by mouth 2 (two) times daily. 01/01/20   Simmons, Brittainy M, PA-C  UNABLE TO FIND CPAP: AT bedtime; setting is "12"    [provider]  Vitamin D, Ergocalciferol, (DRISDOL) 1.25 MG (50000 UNIT) CAPS capsule TAKE 1 CAPSULE BY MOUTH EVERY 7 DAYS. 03/29/20   Lorrene Reid, PA-C    Family History Family History  Problem Relation Age of Onset  . Alzheimer's disease Mother   . Heart attack Mother   . Hypertension Mother   . Glaucoma Mother   . High Cholesterol Mother   . Heart disease Mother   . Depression Mother   . Cancer Father   . High Cholesterol Father   . Heart disease Father   . Sleep apnea Father   . Heart attack Brother   . Heart disease Brother   . Glaucoma Brother   . Hyperlipidemia Brother   . Glaucoma Brother   . Asthma Son   . Allergic rhinitis Neg Hx   . Angioedema Neg Hx   . Eczema Neg Hx   . Urticaria Neg Hx     Social History Social History   Tobacco Use  . Smoking status: Never Smoker  . Smokeless tobacco: Never Used  Vaping Use  . Vaping Use: Never used  Substance Use Topics  . Alcohol use: Yes    Comment: socially   . Drug use: No     Allergies   Sulfa antibiotics, Cymbalta [duloxetine hcl], Demerol, Ivp dye [iodinated diagnostic agents], Morphine and related, Sulfasalazine, and Adhesive [tape]   Review of Systems As per HPI   Physical Exam Triage Vital Signs ED Triage Vitals [04/09/20 1704]  Enc Vitals Group     BP 119/67     Pulse Rate  66     Resp 18     Temp 99.1 F (37.3 C)     Temp src      SpO2 95 %     Weight      Height      Head Circumference      Peak Flow      Pain Score 0     Pain Loc      Pain Edu?      Excl. in [place]?    No data found.  Updated Vital Signs BP 119/67   Pulse 66   Temp 99.1 F  (37.3 C)   Resp 18   SpO2 95%   Visual Acuity Right Eye Distance:   Left Eye Distance:   Bilateral Distance:    Right Eye Near:   Left Eye Near:    Bilateral Near:     Physical Exam Constitutional:      General: She is not in acute distress. HENT:     Head: Normocephalic and atraumatic.  Eyes:     General: No scleral icterus.    Pupils: Pupils are equal, round, and reactive to light.  Cardiovascular:     Rate and Rhythm: Normal rate and regular rhythm.  Pulmonary:     Effort: Pulmonary effort is normal. No respiratory distress.     Breath sounds: No stridor. No wheezing, rhonchi or rales.  Skin:    Coloration: Skin is not jaundiced or pale.  Neurological:     Mental Status: She is alert and oriented to person, place, and time.      UC Treatments / Results  Labs (all labs ordered are listed, but only abnormal results are displayed) Labs Reviewed - No data to display  EKG   Radiology DG Chest 2 View  Result Date: 04/09/2020 CLINICAL DATA:  Cough and fatigue for 1 week. EXAM: CHEST - 2 VIEW COMPARISON:  Radiograph 09/26/2017 FINDINGS: The cardiomediastinal contours are normal. The lungs are clear. Pulmonary vasculature is normal. No evidence of focal mass or nodule. No consolidation, pleural effusion, or pneumothorax. Moderate broad-based scoliotic curvature of the spine with multilevel degenerative change. No acute osseous abnormalities are seen. IMPRESSION: 1. No acute chest findings.  No explanation for cough. 2. Moderate scoliosis with multilevel degenerative change. Electronically Signed   By: Keith Rake M.D.   On: 04/09/2020 18:09    Procedures Procedures (including critical care time)  Medications Ordered in UC Medications - No data to display  Initial Impression / Assessment and Plan / UC Course  I have reviewed the triage vital signs and the nursing notes.  Pertinent labs & imaging results that were available during my care of the patient were  reviewed by me and considered in my medical decision making (see chart for details).     Chest x-ray negative.  Likely bronchitis/viral: We will treat supportively as outlined below.  Return precautions discussed, pt verbalized understanding and is agreeable to plan. Final Clinical Impressions(s) / UC Diagnoses   Final diagnoses:  Cough     Discharge Instructions     Tessalon for cough. Start flonase, atrovent nasal spray for nasal congestion/drainage. You can use over the counter nasal saline rinse such as neti pot for nasal congestion. Keep hydrated, your urine should be clear to pale yellow in color. Tylenol/motrin for fever and pain. Monitor for any worsening of symptoms, chest pain, shortness of breath, wheezing, swelling of the throat, go to the emergency department for further evaluation needed.  ED Prescriptions    Medication Sig Dispense Auth. Provider   predniSONE (DELTASONE) 20 MG tablet Take 1 tablet (20 mg total) by mouth daily. 5 tablet Hall-Potvin, [place], PA-C   albuterol (VENTOLIN HFA) 108 (90 Base) MCG/ACT inhaler Inhale 2 puffs into the lungs every 4 (four) hours as needed for wheezing or shortness of breath. 18 g Hall-Potvin, [place], PA-C   benzonatate (TESSALON) 100 MG capsule Take 1 capsule (100 mg total) by mouth every 8 (eight) hours. 21 capsule Hall-Potvin, [place], PA-C   cetirizine (ZYRTEC ALLERGY) 10 MG tablet Take 1 tablet (10 mg total) by mouth daily. 30 tablet Hall-Potvin, [place], PA-C   fluticasone (FLONASE) 50 MCG/ACT nasal spray Place 1 spray into both nostrils daily. 16 g Hall-Potvin, [place], PA-C     PDMP not reviewed this encounter.   [place], [place] 04/09/20 1929

## 2020-04-09 NOTE — Discharge Instructions (Signed)

## 2020-04-13 ENCOUNTER — Encounter (INDEPENDENT_AMBULATORY_CARE_PROVIDER_SITE_OTHER): Payer: Self-pay | Admitting: Family Medicine

## 2020-04-13 ENCOUNTER — Ambulatory Visit (INDEPENDENT_AMBULATORY_CARE_PROVIDER_SITE_OTHER): Payer: PPO | Admitting: Family Medicine

## 2020-04-13 ENCOUNTER — Other Ambulatory Visit: Payer: Self-pay

## 2020-04-13 VITALS — BP 126/74 | HR 63 | Temp 98.5°F | Ht 61.0 in | Wt 212.0 lb

## 2020-04-13 DIAGNOSIS — Z6841 Body Mass Index (BMI) 40.0 and over, adult: Secondary | ICD-10-CM

## 2020-04-13 DIAGNOSIS — E559 Vitamin D deficiency, unspecified: Secondary | ICD-10-CM

## 2020-04-13 DIAGNOSIS — I5030 Unspecified diastolic (congestive) heart failure: Secondary | ICD-10-CM

## 2020-04-13 DIAGNOSIS — E66813 Obesity, class 3: Secondary | ICD-10-CM

## 2020-04-14 NOTE — Progress Notes (Signed)
Chief Complaint:   OBESITY [name] is here to discuss her progress with her obesity treatment plan along with follow-up of her obesity related diagnoses. [name] is on the Category 2 Plan or the Category 3 Plan and states she is following her eating plan approximately 50% of the time. [name] states she is doing 0 minutes 0 times per week.  Today's visit was #: 8 Starting weight: 239 lbs Starting date: 12/01/2019 Today's weight: 212 lbs Today's date: 04/13/2020 Total lbs lost to date: 27 Total lbs lost since last in-office visit: 3  Interim History: [name] voices she has had COVID type symptoms and tested negative for COVID. She has eaten and drank whatever she can tolerate. She is on prednisone currently. She is finally starting to feel a little better, and has gotten a COVID booster as well.  Subjective:   1. Vitamin D deficiency [name] denies nausea, vomiting, or muscle weakness, but she notes fatigue. She is on prescription Vit D.  2. Diastolic congestive heart failure, unspecified HF chronicity ([place]) [name] is on a congestive heart failure cocktail of medication. She sees Cardiology.  Assessment/Plan:   1. Vitamin D deficiency Low Vitamin D level contributes to fatigue and are associated with obesity, breast, and colon cancer. [name] agreed to continue taking prescription Vitamin D 50,000 IU every week, no refill needed. She will follow-up for routine testing of Vitamin D, at least 2-3 times per year to avoid over-replacement.  2. Diastolic congestive heart failure, unspecified HF chronicity (HCC) [name] is to follow up with Cardiology at previously scheduled appointment, no change in dosage of medications.  3. Class 3 severe obesity with serious comorbidity and body mass index (BMI) of 40.0 to 44.9 in adult, unspecified obesity type (HCC) [name] is currently in the action stage of change. As such, her goal is to continue with weight loss efforts. She has agreed to the Category 2 Plan or the  Category 3 Plan.   Exercise goals: No exercise has been prescribed at this time.  Behavioral modification strategies: increasing lean protein intake, increasing vegetables, meal planning and cooking strategies and keeping healthy foods in the home.  [name] has agreed to follow-up with our clinic in 2 weeks. She was informed of the importance of frequent follow-up visits to maximize her success with intensive lifestyle modifications for her multiple health conditions.   Objective:   Blood pressure 126/74, pulse 63, temperature 98.5 F (36.9 C), temperature source Oral, height 5\' 1"  (1.549 m), weight 212 lb (96.2 kg), SpO2 98 %. Body mass index is 40.06 kg/m.  General: Cooperative, alert, well developed, in no acute distress. HEENT: Conjunctivae and lids unremarkable. Cardiovascular: Regular rhythm.  Lungs: Normal work of breathing. Neurologic: No focal deficits.   Lab Results  Component Value Date   CREATININE 1.11 (H) 03/01/2020   BUN 20 03/01/2020   NA 142 03/01/2020   K 4.5 03/01/2020   CL 104 03/01/2020   CO2 23 03/01/2020   Lab Results  Component Value Date   ALT 19 02/10/2020   AST 20 02/10/2020   ALKPHOS 117 12/01/2019   BILITOT 0.5 02/10/2020   Lab Results  Component Value Date   HGBA1C 6.2 (H) 12/01/2019   HGBA1C 6.1 (H) 08/26/2019   HGBA1C 5.6 08/20/2018   HGBA1C 5.4 03/21/2018   HGBA1C 6.1 (H) 11/26/2017   Lab Results  Component Value Date   INSULIN 32.3 (H) 12/01/2019   Lab Results  Component Value Date   TSH 1.550 12/01/2019  Lab Results  Component Value Date   CHOL 137 12/01/2019   HDL 44 12/01/2019   LDLCALC 58 12/01/2019   TRIG 213 (H) 12/01/2019   CHOLHDL 3.5 08/26/2019   Lab Results  Component Value Date   WBC 6.3 02/10/2020   HGB 13.4 02/10/2020   HCT 40.6 02/10/2020   MCV 87.7 02/10/2020   PLT 176 02/10/2020   No results found for: IRON, TIBC, FERRITIN  Attestation Statements:   Reviewed by clinician on day of visit:  allergies, medications, problem list, medical history, surgical history, family history, social history, and previous encounter notes.  Time spent on visit including pre-visit chart review and post-visit care and charting was 16 minutes.    I, Trixie Dredge, am acting as transcriptionist for Coralie Common, MD.  I have reviewed the above documentation for accuracy and completeness, and I agree with the above. - Jinny Blossom, MD

## 2020-04-15 ENCOUNTER — Ambulatory Visit (INDEPENDENT_AMBULATORY_CARE_PROVIDER_SITE_OTHER): Payer: PPO | Admitting: Psychologist

## 2020-04-15 DIAGNOSIS — F320 Major depressive disorder, single episode, mild: Secondary | ICD-10-CM | POA: Diagnosis not present

## 2020-04-27 ENCOUNTER — Ambulatory Visit (INDEPENDENT_AMBULATORY_CARE_PROVIDER_SITE_OTHER): Payer: PPO | Admitting: Family Medicine

## 2020-04-27 ENCOUNTER — Encounter (INDEPENDENT_AMBULATORY_CARE_PROVIDER_SITE_OTHER): Payer: Self-pay | Admitting: Family Medicine

## 2020-04-27 ENCOUNTER — Other Ambulatory Visit: Payer: Self-pay

## 2020-04-27 VITALS — BP 102/60 | HR 80 | Ht 61.0 in | Wt 212.0 lb

## 2020-04-27 DIAGNOSIS — J3089 Other allergic rhinitis: Secondary | ICD-10-CM

## 2020-04-27 DIAGNOSIS — Z6841 Body Mass Index (BMI) 40.0 and over, adult: Secondary | ICD-10-CM

## 2020-04-27 DIAGNOSIS — R7303 Prediabetes: Secondary | ICD-10-CM

## 2020-04-27 MED ORDER — LEVOCETIRIZINE DIHYDROCHLORIDE 5 MG PO TABS
5.0000 mg | ORAL_TABLET | Freq: Every evening | ORAL | 0 refills | Status: DC
Start: 2020-04-27 — End: 2020-07-22

## 2020-04-28 ENCOUNTER — Other Ambulatory Visit: Payer: Self-pay | Admitting: Physician Assistant

## 2020-04-28 DIAGNOSIS — E782 Mixed hyperlipidemia: Secondary | ICD-10-CM

## 2020-04-28 NOTE — Progress Notes (Signed)
Chief Complaint:   OBESITY [name] is here to discuss her progress with her obesity treatment plan along with follow-up of her obesity related diagnoses. [name] is on the Category 2 Plan or the Category 3 Plan and states she is following her eating plan approximately 90% of the time. [name] states she is walking for 15-20 minutes 2 times per week.  Today's visit was #: 9 Starting weight: 239 lbs Starting date: 12/01/2019 Today's weight: 212 lbs Today's date: 04/27/2020 Total lbs lost to date: 27 Total lbs lost since last in-office visit: 0  Interim History: [name] feels she is getting all the food in, and she feels she has hit a plateau. She took her folder into a restaurant and so she lost all the meal plan. Her [place]. She doesn't anticipate this will impact her weight loss.  Subjective:   1. Allergic rhinitis due to other allergic trigger, unspecified seasonality [name]'s symptoms are well controlled with Xyzal. Previously on Zyrtec without much improvement in symptoms.  2. Pre-diabetes [name]'s last A1c was 6.2 and insulin 32.3. she is not on medications. She still notes occasional carbohydrate cravings.  Assessment/Plan:   1. Allergic rhinitis due to other allergic trigger, unspecified seasonality We will refill Xyzal for 1 month, and [name] will continue to follow up as directed.  2. Pre-diabetes [name] will continue to work on weight loss, exercise, and decreasing simple carbohydrates to help decrease the risk of diabetes. We will repeat labs in 2 months.  3. Class 3 severe obesity with serious comorbidity and body mass index (BMI) of 40.0 to 44.9 in adult, unspecified obesity type (HCC) [name] is currently in the action stage of change. As such, her goal is to continue with weight loss efforts. She has agreed to the Category 2 Plan or the Category 3 Plan.   Exercise goals: All adults should avoid inactivity. Some physical activity is better than  none, and adults who participate in any amount of physical activity gain some health benefits.  Behavioral modification strategies: increasing lean protein intake, meal planning and cooking strategies, keeping healthy foods in the home and planning for success.  [name] has agreed to follow-up with our clinic in 2 to 3 weeks. She was informed of the importance of frequent follow-up visits to maximize her success with intensive lifestyle modifications for her multiple health conditions.   Objective:   Pulse 80, height 5\' 1"  (1.549 m), weight 212 lb (96.2 kg), SpO2 95 %. Body mass index is 40.06 kg/m.  General: Cooperative, alert, well developed, in no acute distress. HEENT: Conjunctivae and lids unremarkable. Cardiovascular: Regular rhythm.  Lungs: Normal work of breathing. Neurologic: No focal deficits.   Lab Results  Component Value Date   CREATININE 1.11 (H) 03/01/2020   BUN 20 03/01/2020   NA 142 03/01/2020   K 4.5 03/01/2020   CL 104 03/01/2020   CO2 23 03/01/2020   Lab Results  Component Value Date   ALT 19 02/10/2020   AST 20 02/10/2020   ALKPHOS 117 12/01/2019   BILITOT 0.5 02/10/2020   Lab Results  Component Value Date   HGBA1C 6.2 (H) 12/01/2019   HGBA1C 6.1 (H) 08/26/2019   HGBA1C 5.6 08/20/2018   HGBA1C 5.4 03/21/2018   HGBA1C 6.1 (H) 11/26/2017   Lab Results  Component Value Date   INSULIN 32.3 (H) 12/01/2019   Lab Results  Component Value Date   TSH 1.550 12/01/2019   Lab Results  Component Value  Date   CHOL 137 12/01/2019   HDL 44 12/01/2019   LDLCALC 58 12/01/2019   TRIG 213 (H) 12/01/2019   CHOLHDL 3.5 08/26/2019   Lab Results  Component Value Date   WBC 6.3 02/10/2020   HGB 13.4 02/10/2020   HCT 40.6 02/10/2020   MCV 87.7 02/10/2020   PLT 176 02/10/2020   No results found for: IRON, TIBC, FERRITIN  Obesity Behavioral Intervention:   Approximately 15 minutes were spent on the discussion below.  ASK: We discussed the diagnosis of  obesity with [name] today and [name] agreed to give [place] permission to discuss obesity behavioral modification therapy today.  ASSESS: [name] has the diagnosis of obesity and her BMI today is 40.08. [name] is in the action stage of change.   ADVISE: [name] was educated on the multiple health risks of obesity as well as the benefit of weight loss to improve her health. She was advised of the need for long term treatment and the importance of lifestyle modifications to improve her current health and to decrease her risk of future health problems.  AGREE: Multiple dietary modification options and treatment options were discussed and [name] agreed to follow the recommendations documented in the above note.  ARRANGE: [name] was educated on the importance of frequent visits to treat obesity as outlined per CMS and USPSTF guidelines and agreed to schedule her next follow up appointment today.  Attestation Statements:   Reviewed by clinician on day of visit: allergies, medications, problem list, medical history, surgical history, family history, social history, and previous encounter notes.   I, Trixie Dredge, am acting as transcriptionist for Coralie Common, MD.  I have reviewed the above documentation for accuracy and completeness, and I agree with the above. - Jinny Blossom, MD

## 2020-05-04 ENCOUNTER — Ambulatory Visit (INDEPENDENT_AMBULATORY_CARE_PROVIDER_SITE_OTHER): Payer: PPO | Admitting: Psychologist

## 2020-05-04 DIAGNOSIS — F320 Major depressive disorder, single episode, mild: Secondary | ICD-10-CM

## 2020-05-11 ENCOUNTER — Ambulatory Visit (INDEPENDENT_AMBULATORY_CARE_PROVIDER_SITE_OTHER): Payer: PPO | Admitting: Family Medicine

## 2020-05-11 ENCOUNTER — Other Ambulatory Visit: Payer: Self-pay

## 2020-05-11 ENCOUNTER — Encounter (INDEPENDENT_AMBULATORY_CARE_PROVIDER_SITE_OTHER): Payer: Self-pay | Admitting: Family Medicine

## 2020-05-11 ENCOUNTER — Other Ambulatory Visit: Payer: Self-pay | Admitting: Rheumatology

## 2020-05-11 VITALS — BP 138/64 | HR 84 | Temp 98.1°F | Ht 61.0 in | Wt 210.0 lb

## 2020-05-11 DIAGNOSIS — R7303 Prediabetes: Secondary | ICD-10-CM | POA: Diagnosis not present

## 2020-05-11 DIAGNOSIS — Z6839 Body mass index (BMI) 39.0-39.9, adult: Secondary | ICD-10-CM | POA: Diagnosis not present

## 2020-05-11 DIAGNOSIS — M0579 Rheumatoid arthritis with rheumatoid factor of multiple sites without organ or systems involvement: Secondary | ICD-10-CM | POA: Diagnosis not present

## 2020-05-11 DIAGNOSIS — G4709 Other insomnia: Secondary | ICD-10-CM | POA: Diagnosis not present

## 2020-05-11 NOTE — Telephone Encounter (Addendum)
Last Visit: 04/01/2020 Next Visit: 09/02/2020 Labs: 02/10/2020 Glucose is mildly elevated, patient is diabetic. Creatinine is elevated. She is on diuretics.   Current Dose per office note 04/01/2020: Arava 20 mg daily  DX: Rheumatoid arthritis involving multiple sites with positive rheumatoid factor   Patient advised she is due to update labs. Patient states she will update them this week.  Okay to refill Arava?

## 2020-05-12 ENCOUNTER — Other Ambulatory Visit: Payer: Self-pay | Admitting: Rheumatology

## 2020-05-12 ENCOUNTER — Telehealth: Payer: Self-pay

## 2020-05-12 DIAGNOSIS — M0579 Rheumatoid arthritis with rheumatoid factor of multiple sites without organ or systems involvement: Secondary | ICD-10-CM

## 2020-05-12 DIAGNOSIS — M3501 Sicca syndrome with keratoconjunctivitis: Secondary | ICD-10-CM

## 2020-05-12 LAB — COMPREHENSIVE METABOLIC PANEL
ALT: 16 IU/L (ref 0–32)
AST: 19 IU/L (ref 0–40)
Albumin/Globulin Ratio: 1.8 (ref 1.2–2.2)
Albumin: 4.2 g/dL (ref 3.8–4.8)
Alkaline Phosphatase: 94 IU/L (ref 44–121)
BUN/Creatinine Ratio: 21 (ref 12–28)
BUN: 25 mg/dL (ref 8–27)
Bilirubin Total: 0.3 mg/dL (ref 0.0–1.2)
CO2: 19 mmol/L — ABNORMAL LOW (ref 20–29)
Calcium: 9.7 mg/dL (ref 8.7–10.3)
Chloride: 104 mmol/L (ref 96–106)
Creatinine, Ser: 1.17 mg/dL — ABNORMAL HIGH (ref 0.57–1.00)
GFR calc Af Amer: 56 mL/min/{1.73_m2} — ABNORMAL LOW (ref >59)
GFR calc non Af Amer: 48 mL/min/{1.73_m2} — ABNORMAL LOW (ref >59)
Globulin, Total: 2.4 g/dL (ref 1.5–4.5)
Glucose: 91 mg/dL (ref 65–99)
Potassium: 4.8 mmol/L (ref 3.5–5.2)
Sodium: 140 mmol/L (ref 134–144)
Total Protein: 6.6 g/dL (ref 6.0–8.5)

## 2020-05-12 LAB — CBC WITH DIFFERENTIAL/PLATELET
Basophils Absolute: 0.1 10*3/uL (ref 0.0–0.2)
Basos: 1 %
EOS (ABSOLUTE): 0.1 10*3/uL (ref 0.0–0.4)
Eos: 1 %
Hematocrit: 40 % (ref 34.0–46.6)
Hemoglobin: 13.2 g/dL (ref 11.1–15.9)
Immature Grans (Abs): 0.1 10*3/uL (ref 0.0–0.1)
Immature Granulocytes: 1 %
Lymphocytes Absolute: 1.1 10*3/uL (ref 0.7–3.1)
Lymphs: 14 %
MCH: 28.7 pg (ref 26.6–33.0)
MCHC: 33 g/dL (ref 31.5–35.7)
MCV: 87 fL (ref 79–97)
Monocytes Absolute: 0.8 10*3/uL (ref 0.1–0.9)
Monocytes: 11 %
Neutrophils Absolute: 5.6 10*3/uL (ref 1.4–7.0)
Neutrophils: 72 %
Platelets: 181 10*3/uL (ref 150–450)
RBC: 4.6 x10E6/uL (ref 3.77–5.28)
RDW: 13.9 % (ref 11.7–15.4)
WBC: 7.7 10*3/uL (ref 3.4–10.8)

## 2020-05-12 LAB — HEMOGLOBIN A1C
Est. average glucose Bld gHb Est-mCnc: 120 mg/dL
Hgb A1c MFr Bld: 5.8 % — ABNORMAL HIGH (ref 4.8–5.6)

## 2020-05-12 NOTE — Telephone Encounter (Signed)
Patient called stating she had labwork yesterday with Dr. Adair Patter.  Patient states the results are in [place].

## 2020-05-12 NOTE — Progress Notes (Signed)
Chief Complaint:   OBESITY [name] is here to discuss her progress with her obesity treatment plan along with follow-up of her obesity related diagnoses. [name] is on the Category 2 Plan or the Category 3 Plan and states she is following her eating plan approximately 90% of the time. [name] states she is walking for 20-25 minutes 7 times per week.  Today's visit was #: 10 Starting weight: 239 lbs Starting date: 12/01/2019 Today's weight: 210 lbs Today's date: 05/11/2020 Total lbs lost to date: 29 Total lbs lost since last in-office visit: 2  Interim History: [name]'s family has just returned from overseas and she has been spending significant amount of time with them. She is planning to go to [place] this weekend. Also planning to go to [facility] and [place] for a few trips.  Subjective:   1. Pre-diabetes [name]'s last A1c was 6.2 and insulin 32.3. She is not on metformin.  2. Rheumatoid arthritis involving multiple sites with positive rheumatoid factor (HCC) [name] sees Dr. Estanislado Pandy. She is on Leflunomide, and she has an appointment with Dr. Estanislado Pandy in the near future.  3. Other insomnia [name] is struggling with getting lengthy amount of sleep in. She is not on medications, and she denies a history of obstructive sleep apnea.  Assessment/Plan:   1. Pre-diabetes [name] will continue to work on weight loss, exercise, and decreasing simple carbohydrates to help decrease the risk of diabetes. We will check labs today.  - Hemoglobin A1c - Comprehensive metabolic panel  2. Rheumatoid arthritis involving multiple sites with positive rheumatoid factor (HCC) We will check labs today. [name] will follow up with Dr. Estanislado Pandy.  - CBC w/Diff/Platelet  3. Other insomnia The problem of recurrent insomnia was discussed. Orders and follow up as documented in patient record. Counseling: Intensive lifestyle modifications are the first line treatment for this issue. We discussed several  lifestyle modifications today. Sleep hygiene was discussed, and melatonin and sleep handout was given today. [name] will continue to work on diet, exercise and weight loss efforts.   Counseling  Limit or avoid alcohol, caffeinated beverages, and cigarettes, especially close to bedtime.   Do not eat a large meal or eat spicy foods right before bedtime. This can lead to digestive discomfort that can make it hard for you to sleep.  Keep a sleep diary to help you and your health care provider figure out what could be causing your insomnia.  . Make your bedroom a dark, comfortable place where it is easy to fall asleep. ? Put up shades or blackout curtains to block light from outside. ? Use a white noise machine to block noise. ? Keep the temperature cool. . Limit screen use before bedtime. This includes: ? Watching TV. ? Using your smartphone, tablet, or computer. . Stick to a routine that includes going to bed and waking up at the same times every day and night. This can help you fall asleep faster. Consider making a quiet activity, such as reading, part of your nighttime routine. . Try to avoid taking naps during the day so that you sleep better at night. . Get out of bed if you are still awake after 15 minutes of trying to sleep. Keep the lights down, but try reading or doing a quiet activity. When you feel sleepy, go back to bed.  4. Class 2 severe obesity with serious comorbidity and body mass index (BMI) of 39.0 to 39.9 in adult, unspecified obesity type (HCC) [name] is  currently in the action stage of change. As such, her goal is to continue with weight loss efforts. She has agreed to the Category 2 Plan or the Category 3 Plan.   Exercise goals: All adults should avoid inactivity. Some physical activity is better than none, and adults who participate in any amount of physical activity gain some health benefits.  Behavioral modification strategies: increasing lean protein intake, increasing  vegetables, meal planning and cooking strategies and keeping healthy foods in the home.  [name] has agreed to follow-up with our clinic in 2 to 3 weeks. She was informed of the importance of frequent follow-up visits to maximize her success with intensive lifestyle modifications for her multiple health conditions.   Objective:   Blood pressure 138/64, pulse 84, temperature 98.1 F (36.7 C), temperature source Oral, height 5\' 1"  (1.549 m), weight 210 lb (95.3 kg), SpO2 97 %. Body mass index is 39.68 kg/m.  General: Cooperative, alert, well developed, in no acute distress. HEENT: Conjunctivae and lids unremarkable. Cardiovascular: Regular rhythm.  Lungs: Normal work of breathing. Neurologic: No focal deficits.   Lab Results  Component Value Date   CREATININE 1.17 (H) 05/11/2020   BUN 25 05/11/2020   NA 140 05/11/2020   K 4.8 05/11/2020   CL 104 05/11/2020   CO2 19 (L) 05/11/2020   Lab Results  Component Value Date   ALT 16 05/11/2020   AST 19 05/11/2020   ALKPHOS 94 05/11/2020   BILITOT 0.3 05/11/2020   Lab Results  Component Value Date   HGBA1C 5.8 (H) 05/11/2020   HGBA1C 6.2 (H) 12/01/2019   HGBA1C 6.1 (H) 08/26/2019   HGBA1C 5.6 08/20/2018   HGBA1C 5.4 03/21/2018   Lab Results  Component Value Date   INSULIN 32.3 (H) 12/01/2019   Lab Results  Component Value Date   TSH 1.550 12/01/2019   Lab Results  Component Value Date   CHOL 137 12/01/2019   HDL 44 12/01/2019   LDLCALC 58 12/01/2019   TRIG 213 (H) 12/01/2019   CHOLHDL 3.5 08/26/2019   Lab Results  Component Value Date   WBC 7.7 05/11/2020   HGB 13.2 05/11/2020   HCT 40.0 05/11/2020   MCV 87 05/11/2020   PLT 181 05/11/2020   No results found for: IRON, TIBC, FERRITIN  Obesity Behavioral Intervention:   Approximately 15 minutes were spent on the discussion below.  ASK: We discussed the diagnosis of obesity with [name] today and [name] agreed to give [place] permission to discuss obesity behavioral  modification therapy today.  ASSESS: [name] has the diagnosis of obesity and her BMI today is 39.7. [name] is in the action stage of change.   ADVISE: [name] was educated on the multiple health risks of obesity as well as the benefit of weight loss to improve her health. She was advised of the need for long term treatment and the importance of lifestyle modifications to improve her current health and to decrease her risk of future health problems.  AGREE: Multiple dietary modification options and treatment options were discussed and [name] agreed to follow the recommendations documented in the above note.  ARRANGE: [name] was educated on the importance of frequent visits to treat obesity as outlined per CMS and USPSTF guidelines and agreed to schedule her next follow up appointment today.  Attestation Statements:   Reviewed by clinician on day of visit: allergies, medications, problem list, medical history, surgical history, family history, social history, and previous encounter notes.   Wilhemena Durie, am acting  as transcriptionist for Coralie Common, MD.  I have reviewed the above documentation for accuracy and completeness, and I agree with the above. - Jinny Blossom, MD

## 2020-05-12 NOTE — Telephone Encounter (Signed)
Patient is on Arava 20 mg.. Labs: CBC/CMP Creatinine 1.17, previously 1.11 GFR 48, previously 52 CO2 19  All the rest of labs are WNL

## 2020-05-12 NOTE — Telephone Encounter (Signed)
Last Visit: 04/01/2020 Next Visit: 09/02/2020 Labs: 05/11/2020 Creatinine 1.17, previously 1.11 GFR 48, previously 52 CO2 19 PLQ Eye Exam: 01/14/2020 WNL  Current Dose per office note on 04/01/2020: Plaquenil 200 mg 1 tablet by mouth twice daily Monday through Friday Dx: Rheumatoid arthritis involving multiple sites with positive rheumatoid factor   Okay to refill PLQ and Tizanidine?

## 2020-05-12 NOTE — Telephone Encounter (Signed)
I reviewed her lab work from 05/11/20. I also sent refills of PLQ and zanaflex to the pharmacy.

## 2020-05-18 DIAGNOSIS — G4733 Obstructive sleep apnea (adult) (pediatric): Secondary | ICD-10-CM | POA: Diagnosis not present

## 2020-05-24 ENCOUNTER — Ambulatory Visit: Payer: PPO | Admitting: Psychologist

## 2020-06-01 ENCOUNTER — Encounter (INDEPENDENT_AMBULATORY_CARE_PROVIDER_SITE_OTHER): Payer: Self-pay | Admitting: Family Medicine

## 2020-06-01 ENCOUNTER — Other Ambulatory Visit: Payer: Self-pay

## 2020-06-01 ENCOUNTER — Ambulatory Visit (INDEPENDENT_AMBULATORY_CARE_PROVIDER_SITE_OTHER): Payer: PPO | Admitting: Family Medicine

## 2020-06-01 ENCOUNTER — Ambulatory Visit (INDEPENDENT_AMBULATORY_CARE_PROVIDER_SITE_OTHER): Payer: PPO | Admitting: Psychologist

## 2020-06-01 VITALS — BP 129/69 | HR 61 | Temp 98.3°F | Ht 61.0 in | Wt 212.0 lb

## 2020-06-01 DIAGNOSIS — R7303 Prediabetes: Secondary | ICD-10-CM | POA: Diagnosis not present

## 2020-06-01 DIAGNOSIS — E7849 Other hyperlipidemia: Secondary | ICD-10-CM | POA: Diagnosis not present

## 2020-06-01 DIAGNOSIS — Z6841 Body Mass Index (BMI) 40.0 and over, adult: Secondary | ICD-10-CM

## 2020-06-01 DIAGNOSIS — F320 Major depressive disorder, single episode, mild: Secondary | ICD-10-CM

## 2020-06-02 LAB — INSULIN, RANDOM: INSULIN: 21 u[IU]/mL (ref 2.6–24.9)

## 2020-06-08 ENCOUNTER — Encounter: Payer: Self-pay | Admitting: Physician Assistant

## 2020-06-08 NOTE — Progress Notes (Signed)
Chief Complaint:   OBESITY [name] is here to discuss her progress with her obesity treatment plan along with follow-up of her obesity related diagnoses. [name] is on the Category 2 Plan and the Category 3 Plan (mainly Category 3) and states she is following her eating plan approximately 50-60% of the time. [name] states she is walking more 7 times per week.  Today's visit was #: 11 Starting weight: 239 lbs Starting date: 12/01/2019 Today's weight: 212 lbs Today's date: 06/01/2020 Total lbs lost to date: 27 Total lbs lost since last in-office visit: 0  Interim History: [name] fell 2 weeks ago and fractured her sacrum - treatment is pain control. She has had company so she wasn't able to follow the plan as strictly as she wanted to. She is going out for her birthday in the upcoming few weeks.  Subjective:   Prediabetes. [name] has a diagnosis of prediabetes based on her elevated HgA1c and was informed this puts her at greater risk of developing diabetes. She continues to work on diet and exercise to decrease her risk of diabetes. She denies nausea or hypoglycemia. A1c dropped from 6.2 to 5.8. We didn't get insulin level as the patient wasn't fasting. [name] is on no medication. She reports doing some indulgent eating over the last few weeks.   Lab Results  Component Value Date   HGBA1C 5.8 (H) 05/11/2020   Lab Results  Component Value Date   INSULIN 32.3 (H) 12/01/2019   Other hyperlipidemia. [name] is on atorvastatin and denies myalgias or transaminitis.   Lab Results  Component Value Date   CHOL 137 12/01/2019   HDL 44 12/01/2019   LDLCALC 58 12/01/2019   TRIG 213 (H) 12/01/2019   CHOLHDL 3.5 08/26/2019   Lab Results  Component Value Date   ALT 16 05/11/2020   AST 19 05/11/2020   ALKPHOS 94 05/11/2020   BILITOT 0.3 05/11/2020   The 10-year ASCVD risk score [name] DC Jr., et al., 2013) is: 8.5%   Values used to calculate the score:     Age: 64 years     Sex: Female      Is Non-Hispanic African American: No     Diabetic: No     Tobacco smoker: No     Systolic Blood Pressure: 809 mmHg     Is BP treated: Yes     HDL Cholesterol: 44 mg/dL     Total Cholesterol: 137 mg/dL  Assessment/Plan:   Prediabetes. Pippa will continue to work on weight loss, exercise, and decreasing simple carbohydrates to help decrease the risk of diabetes. Insulin, random level will be checked today.  Other hyperlipidemia. Cardiovascular risk and specific lipid/LDL goals reviewed.  We discussed several lifestyle modifications today and [name] will continue to work on diet, exercise and weight loss efforts. Orders and follow up as documented in patient record. She will continue her current medication at the current dose.  Counseling Intensive lifestyle modifications are the first line treatment for this issue. . Dietary changes: Increase soluble fiber. Decrease simple carbohydrates. . Exercise changes: Moderate to vigorous-intensity aerobic activity 150 minutes per week if tolerated. . Lipid-lowering medications: see documented in medical record.  Class 3 severe obesity with serious comorbidity and body mass index (BMI) of 40.0 to 44.9 in adult, unspecified obesity type ([place]).  [name] is currently in the action stage of change. As such, her goal is to continue with weight loss efforts. She has agreed to the Category 3 Plan.  Exercise goals: Older adults should follow the adult guidelines. When older adults cannot meet the adult guidelines, they should be as physically active as their abilities and conditions will allow.   Behavioral modification strategies: increasing lean protein intake, meal planning and cooking strategies, keeping healthy foods in the home, dealing with family or coworker sabotage, avoiding temptations and planning for success.  [name] has agreed to follow-up with our clinic in 2-3 weeks. She was informed of the importance of frequent follow-up visits to maximize her  success with intensive lifestyle modifications for her multiple health conditions.   [name] was informed we would discuss her lab results at her next visit unless there is a critical issue that needs to be addressed sooner. [name] agreed to keep her next visit at the agreed upon time to discuss these results.  Objective:   Blood pressure 129/69, pulse 61, temperature 98.3 F (36.8 C), temperature source Oral, height 5\' 1"  (1.549 m), weight 212 lb (96.2 kg), SpO2 98 %. Body mass index is 40.06 kg/m.  General: Cooperative, alert, well developed, in no acute distress. HEENT: Conjunctivae and lids unremarkable. Cardiovascular: Regular rhythm.  Lungs: Normal work of breathing. Neurologic: No focal deficits.   Lab Results  Component Value Date   CREATININE 1.17 (H) 05/11/2020   BUN 25 05/11/2020   NA 140 05/11/2020   K 4.8 05/11/2020   CL 104 05/11/2020   CO2 19 (L) 05/11/2020   Lab Results  Component Value Date   ALT 16 05/11/2020   AST 19 05/11/2020   ALKPHOS 94 05/11/2020   BILITOT 0.3 05/11/2020   Lab Results  Component Value Date   HGBA1C 5.8 (H) 05/11/2020   HGBA1C 6.2 (H) 12/01/2019   HGBA1C 6.1 (H) 08/26/2019   HGBA1C 5.6 08/20/2018   HGBA1C 5.4 03/21/2018   Lab Results  Component Value Date   INSULIN 32.3 (H) 12/01/2019   Lab Results  Component Value Date   TSH 1.550 12/01/2019   Lab Results  Component Value Date   CHOL 137 12/01/2019   HDL 44 12/01/2019   LDLCALC 58 12/01/2019   TRIG 213 (H) 12/01/2019   CHOLHDL 3.5 08/26/2019   Lab Results  Component Value Date   WBC 7.7 05/11/2020   HGB 13.2 05/11/2020   HCT 40.0 05/11/2020   MCV 87 05/11/2020   PLT 181 05/11/2020   No results found for: IRON, TIBC, FERRITIN  Obesity Behavioral Intervention:   Approximately 15 minutes were spent on the discussion below.  ASK: We discussed the diagnosis of obesity with [name] today and [name] agreed to give [place] permission to discuss obesity behavioral modification  therapy today.  ASSESS: [name] has the diagnosis of obesity and her BMI today is 40.2. [name] is in the action stage of change.   ADVISE: [name] was educated on the multiple health risks of obesity as well as the benefit of weight loss to improve her health. She was advised of the need for long term treatment and the importance of lifestyle modifications to improve her current health and to decrease her risk of future health problems.  AGREE: Multiple dietary modification options and treatment options were discussed and Chriselda agreed to follow the recommendations documented in the above note.  ARRANGE: Kayana was educated on the importance of frequent visits to treat obesity as outlined per CMS and USPSTF guidelines and agreed to schedule her next follow up appointment today.  Attestation Statements:   Reviewed by clinician on day of visit: allergies, medications, problem list,  medical history, surgical history, family history, social history, and previous encounter notes.  I, Michaelene Song, am acting as transcriptionist for Coralie Common, MD   I have reviewed the above documentation for accuracy and completeness, and I agree with the above. - Jinny Blossom, MD

## 2020-06-10 ENCOUNTER — Ambulatory Visit (INDEPENDENT_AMBULATORY_CARE_PROVIDER_SITE_OTHER): Payer: PPO | Admitting: Psychologist

## 2020-06-10 DIAGNOSIS — F320 Major depressive disorder, single episode, mild: Secondary | ICD-10-CM

## 2020-06-14 DIAGNOSIS — D225 Melanocytic nevi of trunk: Secondary | ICD-10-CM | POA: Diagnosis not present

## 2020-06-14 DIAGNOSIS — L853 Xerosis cutis: Secondary | ICD-10-CM | POA: Diagnosis not present

## 2020-06-14 DIAGNOSIS — L905 Scar conditions and fibrosis of skin: Secondary | ICD-10-CM | POA: Diagnosis not present

## 2020-06-14 DIAGNOSIS — L821 Other seborrheic keratosis: Secondary | ICD-10-CM | POA: Diagnosis not present

## 2020-06-14 DIAGNOSIS — L720 Epidermal cyst: Secondary | ICD-10-CM | POA: Diagnosis not present

## 2020-06-14 DIAGNOSIS — Z85828 Personal history of other malignant neoplasm of skin: Secondary | ICD-10-CM | POA: Diagnosis not present

## 2020-06-14 DIAGNOSIS — D1801 Hemangioma of skin and subcutaneous tissue: Secondary | ICD-10-CM | POA: Diagnosis not present

## 2020-06-15 ENCOUNTER — Other Ambulatory Visit: Payer: Self-pay | Admitting: Rheumatology

## 2020-06-16 NOTE — Telephone Encounter (Signed)
Last Visit:04/01/2020 Next Visit:09/02/2020  Okay to refill Promethazine?

## 2020-06-22 ENCOUNTER — Ambulatory Visit (INDEPENDENT_AMBULATORY_CARE_PROVIDER_SITE_OTHER): Payer: PPO | Admitting: Family Medicine

## 2020-06-23 ENCOUNTER — Ambulatory Visit (INDEPENDENT_AMBULATORY_CARE_PROVIDER_SITE_OTHER): Payer: PPO | Admitting: Psychologist

## 2020-06-23 DIAGNOSIS — F320 Major depressive disorder, single episode, mild: Secondary | ICD-10-CM | POA: Diagnosis not present

## 2020-06-24 ENCOUNTER — Encounter (INDEPENDENT_AMBULATORY_CARE_PROVIDER_SITE_OTHER): Payer: Self-pay | Admitting: Family Medicine

## 2020-06-24 ENCOUNTER — Other Ambulatory Visit: Payer: Self-pay

## 2020-06-24 ENCOUNTER — Ambulatory Visit (INDEPENDENT_AMBULATORY_CARE_PROVIDER_SITE_OTHER): Payer: PPO | Admitting: Family Medicine

## 2020-06-24 VITALS — BP 108/56 | HR 80 | Temp 98.2°F | Ht 61.0 in | Wt 209.0 lb

## 2020-06-24 DIAGNOSIS — Z6839 Body mass index (BMI) 39.0-39.9, adult: Secondary | ICD-10-CM | POA: Diagnosis not present

## 2020-06-24 DIAGNOSIS — R7303 Prediabetes: Secondary | ICD-10-CM | POA: Diagnosis not present

## 2020-06-24 DIAGNOSIS — E559 Vitamin D deficiency, unspecified: Secondary | ICD-10-CM | POA: Diagnosis not present

## 2020-06-29 NOTE — Progress Notes (Signed)
Chief Complaint:   OBESITY [name] is here to discuss her progress with her obesity treatment plan along with follow-up of her obesity related diagnoses. [name] is on the Category 3 Plan and states she is following her eating plan approximately 85% of the time. [name] states she is walking for 30 minutes 3 times per week.  Today's visit was #: 12 Starting weight: 239 lbs Starting date: 12/01/2019 Today's weight: 209 lbs Today's date: 06/24/2020 Total lbs lost to date: 30 Total lbs lost since last in-office visit: 3  Interim History: [name]'s family left on 06/10/2020. She has been doing her best to follow meal plan as strictly as she can the last few weeks. The biggest struggle is eating all of the food on the plan. She is going to [place] for [place]. She said the hardest thing she still deals with is limiting sugar sweetened beverages.   Subjective:   1. Vitamin D deficiency [name] denies nausea, vomiting, or muscle weakness, but she notes fatigue. She is on prescription Vit D.  2. Pre-diabetes [name]'s last A1c was 5.8 and insulin 21. She is not on medications.  Assessment/Plan:   1. Vitamin D deficiency Low Vitamin D level contributes to fatigue and are associated with obesity, breast, and colon cancer. [name] agreed to continue taking prescription Vitamin D 50,000 IU every week, no refill needed. She will follow-up for routine testing of Vitamin D, at least 2-3 times per year to avoid over-replacement.  2. Pre-diabetes [name] will continue to work on weight loss, exercise, and decreasing simple carbohydrates to help decrease the risk of diabetes. We will repeat labs in February 2022.   3. Class 2 severe obesity with serious comorbidity and body mass index (BMI) of 39.0 to 39.9 in adult, unspecified obesity type (HCC) [name] is currently in the action stage of change. As such, her goal is to continue with weight loss efforts. She has agreed to the Category 3 Plan and the Category  4 Plan.   Exercise goals: As is, she is to look up possible exercise options in the next few weeks.  Behavioral modification strategies: increasing lean protein intake, meal planning and cooking strategies, keeping healthy foods in the home and holiday eating strategies .  [name] has agreed to follow-up with our clinic in 3 to 4 weeks. She was informed of the importance of frequent follow-up visits to maximize her success with intensive lifestyle modifications for her multiple health conditions.   Objective:   Blood pressure (!) 108/56, pulse 80, temperature 98.2 F (36.8 C), temperature source Oral, height 5\' 1"  (1.549 m), weight 209 lb (94.8 kg), SpO2 96 %. Body mass index is 39.49 kg/m.  General: Cooperative, alert, well developed, in no acute distress. HEENT: Conjunctivae and lids unremarkable. Cardiovascular: Regular rhythm.  Lungs: Normal work of breathing. Neurologic: No focal deficits.   Lab Results  Component Value Date   CREATININE 1.17 (H) 05/11/2020   BUN 25 05/11/2020   NA 140 05/11/2020   K 4.8 05/11/2020   CL 104 05/11/2020   CO2 19 (L) 05/11/2020   Lab Results  Component Value Date   ALT 16 05/11/2020   AST 19 05/11/2020   ALKPHOS 94 05/11/2020   BILITOT 0.3 05/11/2020   Lab Results  Component Value Date   HGBA1C 5.8 (H) 05/11/2020   HGBA1C 6.2 (H) 12/01/2019   HGBA1C 6.1 (H) 08/26/2019   HGBA1C 5.6 08/20/2018   HGBA1C 5.4 03/21/2018   Lab Results  Component Value  Date   INSULIN 21.0 06/01/2020   INSULIN 32.3 (H) 12/01/2019   Lab Results  Component Value Date   TSH 1.550 12/01/2019   Lab Results  Component Value Date   CHOL 137 12/01/2019   HDL 44 12/01/2019   LDLCALC 58 12/01/2019   TRIG 213 (H) 12/01/2019   CHOLHDL 3.5 08/26/2019   Lab Results  Component Value Date   WBC 7.7 05/11/2020   HGB 13.2 05/11/2020   HCT 40.0 05/11/2020   MCV 87 05/11/2020   PLT 181 05/11/2020   No results found for: IRON, TIBC, FERRITIN   Attestation  Statements:   Reviewed by clinician on day of visit: allergies, medications, problem list, medical history, surgical history, family history, social history, and previous encounter notes.  Time spent on visit including pre-visit chart review and post-visit care and charting was 17 minutes.    I, Trixie Dredge, am acting as transcriptionist for Coralie Common, MD.  I have reviewed the above documentation for accuracy and completeness, and I agree with the above. - Jinny Blossom, MD

## 2020-07-05 ENCOUNTER — Other Ambulatory Visit: Payer: Self-pay | Admitting: Physician Assistant

## 2020-07-05 ENCOUNTER — Other Ambulatory Visit: Payer: Self-pay | Admitting: Adult Health

## 2020-07-05 DIAGNOSIS — E559 Vitamin D deficiency, unspecified: Secondary | ICD-10-CM

## 2020-07-19 ENCOUNTER — Ambulatory Visit (INDEPENDENT_AMBULATORY_CARE_PROVIDER_SITE_OTHER): Payer: PPO | Admitting: Psychologist

## 2020-07-19 DIAGNOSIS — F320 Major depressive disorder, single episode, mild: Secondary | ICD-10-CM | POA: Diagnosis not present

## 2020-07-21 ENCOUNTER — Ambulatory Visit: Payer: PPO | Admitting: Physician Assistant

## 2020-07-22 ENCOUNTER — Ambulatory Visit (INDEPENDENT_AMBULATORY_CARE_PROVIDER_SITE_OTHER): Payer: PPO | Admitting: Family Medicine

## 2020-07-22 ENCOUNTER — Encounter (INDEPENDENT_AMBULATORY_CARE_PROVIDER_SITE_OTHER): Payer: Self-pay | Admitting: Family Medicine

## 2020-07-22 ENCOUNTER — Other Ambulatory Visit: Payer: Self-pay

## 2020-07-22 VITALS — BP 116/64 | HR 67 | Temp 98.6°F | Ht 61.0 in | Wt 208.0 lb

## 2020-07-22 DIAGNOSIS — J3089 Other allergic rhinitis: Secondary | ICD-10-CM

## 2020-07-22 DIAGNOSIS — Z6839 Body mass index (BMI) 39.0-39.9, adult: Secondary | ICD-10-CM | POA: Diagnosis not present

## 2020-07-22 DIAGNOSIS — R7303 Prediabetes: Secondary | ICD-10-CM | POA: Diagnosis not present

## 2020-07-22 MED ORDER — LEVOCETIRIZINE DIHYDROCHLORIDE 5 MG PO TABS: 5.0000 mg | ORAL_TABLET | Freq: Every evening | ORAL | 0 refills | Status: DC

## 2020-07-26 ENCOUNTER — Other Ambulatory Visit: Payer: Self-pay | Admitting: Rheumatology

## 2020-07-26 ENCOUNTER — Other Ambulatory Visit: Payer: Self-pay | Admitting: Physician Assistant

## 2020-07-26 DIAGNOSIS — M0579 Rheumatoid arthritis with rheumatoid factor of multiple sites without organ or systems involvement: Secondary | ICD-10-CM

## 2020-07-26 DIAGNOSIS — M3501 Sicca syndrome with keratoconjunctivitis: Secondary | ICD-10-CM

## 2020-07-26 DIAGNOSIS — E782 Mixed hyperlipidemia: Secondary | ICD-10-CM

## 2020-07-26 NOTE — Progress Notes (Signed)
Chief Complaint:   OBESITY [name] is here to discuss her progress with her obesity treatment plan along with follow-up of her obesity related diagnoses. [name] is on the Category 3 Plan or the Category 4 Plan and states she is following her eating plan approximately 70% of the time. [name] states she is walking 1 mile 5 times per week.   Today's visit was #: 13 Starting weight: 239 lbs Starting date: 12/01/2019 Today's weight: 208 lbs Today's date: 07/22/2020 Total lbs lost to date: 31 Total lbs lost since last in-office visit: 1  Interim History: [name] voices she plans to Christmas shop today. For Thanksgiving she went to her grandson's wife's family. She went to [place] with her girlfriends recently and did indulgent eating. She is not sure what she is planning for Christmas. She reports she knows that being surrounded by indulgent eating will make the next few weeks harder.  Subjective:   1. Allergic rhinitis due to other allergic trigger, Marisa seasonality [name] is on Xyzal, and her symptoms are well controlled on her medications. She denies side effects.  2. Pre-diabetes [name]'s last A1c was 5.8 and insulin 21.0. She is not currently on metformin.  Assessment/Plan:   1. Allergic rhinitis due to other allergic trigger, Marisa seasonality We will refill Xyzal for 90 days with no refills. [name] will continue to follow up as directed.  - levocetirizine (XYZAL) 5 MG tablet; Take 1 tablet (5 mg total) by mouth every evening.  Dispense: 90 tablet; Refill: 0  2. Pre-diabetes [name] will continue to work on weight loss, exercise, and decreasing simple carbohydrates to help decrease the risk of diabetes. We will repeat labs in 3 months.  3. Class 2 severe obesity with serious comorbidity and body mass index (BMI) of 39.0 to 39.9 in adult, Marisa obesity type ([name]) [name] is currently in the action stage of change. As such, her goal is to continue with weight loss efforts. She  has agreed to the Category 3 Plan or the Category 4 Plan.   Exercise goals: All adults should avoid inactivity. Some physical activity is better than none, and adults who participate in any amount of physical activity gain some health benefits.  Behavioral modification strategies: increasing lean protein intake, meal planning and cooking strategies, keeping healthy foods in the home and planning for success.  [name] has agreed to follow-up with our clinic in 4 weeks. She was informed of the importance of frequent follow-up visits to maximize her success with intensive lifestyle modifications for her multiple health conditions.   Objective:   Blood pressure 116/64, pulse 67, temperature 98.6 F (37 C), temperature source Oral, height 5\' 1"  (1.549 m), weight 208 lb (94.3 kg), SpO2 98 %. Body mass index is 39.3 kg/m.  General: Cooperative, alert, well developed, in no acute distress. HEENT: Conjunctivae and lids unremarkable. Cardiovascular: Regular rhythm.  Lungs: Normal work of breathing. Neurologic: No focal deficits.   Lab Results  Component Value Date   CREATININE 1.17 (H) 05/11/2020   BUN 25 05/11/2020   NA 140 05/11/2020   K 4.8 05/11/2020   CL 104 05/11/2020   CO2 19 (L) 05/11/2020   Lab Results  Component Value Date   ALT 16 05/11/2020   AST 19 05/11/2020   ALKPHOS 94 05/11/2020   BILITOT 0.3 05/11/2020   Lab Results  Component Value Date   HGBA1C 5.8 (H) 05/11/2020   HGBA1C 6.2 (H) 12/01/2019   HGBA1C 6.1 (H) 08/26/2019   HGBA1C 5.6  08/20/2018   HGBA1C 5.4 03/21/2018   Lab Results  Component Value Date   INSULIN 21.0 06/01/2020   INSULIN 32.3 (H) 12/01/2019   Lab Results  Component Value Date   TSH 1.550 12/01/2019   Lab Results  Component Value Date   CHOL 137 12/01/2019   HDL 44 12/01/2019   LDLCALC 58 12/01/2019   TRIG 213 (H) 12/01/2019   CHOLHDL 3.5 08/26/2019   Lab Results  Component Value Date   WBC 7.7 05/11/2020   HGB 13.2 05/11/2020    HCT 40.0 05/11/2020   MCV 87 05/11/2020   PLT 181 05/11/2020   No results found for: IRON, TIBC, FERRITIN  Obesity Behavioral Intervention:   Approximately 15 minutes were spent on the discussion below.  ASK: We discussed the diagnosis of obesity with [name] today and [name] agreed to give [place] permission to discuss obesity behavioral modification therapy today.  ASSESS: [name] has the diagnosis of obesity and her BMI today is 39.32. [name] is in the action stage of change.   ADVISE: [name] was educated on the multiple health risks of obesity as well as the benefit of weight loss to improve her health. She was advised of the need for long term treatment and the importance of lifestyle modifications to improve her current health and to decrease her risk of future health problems.  AGREE: Multiple dietary modification options and treatment options were discussed and [name] agreed to follow the recommendations documented in the above note.  ARRANGE: [name] was educated on the importance of frequent visits to treat obesity as outlined per CMS and USPSTF guidelines and agreed to schedule her next follow up appointment today.  Attestation Statements:   Reviewed by clinician on day of visit: allergies, medications, problem list, medical history, surgical history, family history, social history, and previous encounter notes.   I, Trixie Dredge, am acting as transcriptionist for Coralie Common, MD.  I have reviewed the above documentation for accuracy and completeness, and I agree with the above. - Jinny Blossom, MD

## 2020-07-27 ENCOUNTER — Ambulatory Visit: Payer: PPO | Admitting: Psychologist

## 2020-07-28 ENCOUNTER — Ambulatory Visit (INDEPENDENT_AMBULATORY_CARE_PROVIDER_SITE_OTHER): Payer: PPO | Admitting: Physician Assistant

## 2020-07-28 ENCOUNTER — Other Ambulatory Visit: Payer: Self-pay

## 2020-07-28 ENCOUNTER — Encounter: Payer: Self-pay | Admitting: Physician Assistant

## 2020-07-28 ENCOUNTER — Encounter: Payer: Self-pay | Admitting: Rheumatology

## 2020-07-28 ENCOUNTER — Encounter: Payer: Self-pay | Admitting: Adult Health

## 2020-07-28 VITALS — BP 105/69 | HR 71 | Temp 99.5°F | Ht 61.0 in | Wt 212.4 lb

## 2020-07-28 DIAGNOSIS — M0579 Rheumatoid arthritis with rheumatoid factor of multiple sites without organ or systems involvement: Secondary | ICD-10-CM

## 2020-07-28 DIAGNOSIS — M62830 Muscle spasm of back: Secondary | ICD-10-CM | POA: Diagnosis not present

## 2020-07-28 DIAGNOSIS — N289 Disorder of kidney and ureter, unspecified: Secondary | ICD-10-CM | POA: Diagnosis not present

## 2020-07-28 DIAGNOSIS — M3501 Sicca syndrome with keratoconjunctivitis: Secondary | ICD-10-CM

## 2020-07-28 DIAGNOSIS — R809 Proteinuria, unspecified: Secondary | ICD-10-CM

## 2020-07-28 DIAGNOSIS — M797 Fibromyalgia: Secondary | ICD-10-CM | POA: Diagnosis not present

## 2020-07-28 LAB — POCT URINALYSIS DIPSTICK
Bilirubin, UA: NEGATIVE
Blood, UA: NEGATIVE
Glucose, UA: NEGATIVE
Ketones, UA: NEGATIVE
Nitrite, UA: NEGATIVE
Protein, UA: POSITIVE — AB
Spec Grav, UA: >=1.03 — AB (ref 1.010–1.025)
Urobilinogen, UA: 0.2 E.U./dL
pH, UA: 5 (ref 5.0–8.0)

## 2020-07-28 LAB — POCT UA - MICROALBUMIN
Creatinine, POC: 300 mg/dL
Microalbumin Ur, POC: 80 mg/L

## 2020-07-28 MED ORDER — AMBULATORY NON FORMULARY MEDICATION: 0 refills | Status: DC

## 2020-07-28 NOTE — Patient Instructions (Signed)
Chronic Kidney Disease, Adult Chronic kidney disease (CKD) occurs when the kidneys become damaged slowly over a long period of time. The kidneys are a pair of organs that do many important jobs in the body, including:  Removing waste and extra fluid from the blood to make urine.  Making hormones that maintain the amount of fluid in tissues and blood vessels.  Maintaining the right amount of fluids and chemicals in the body. A small amount of kidney damage may not cause problems, but a large amount of damage may make it hard or impossible for the kidneys to work the way they should. If steps are not taken to slow down kidney damage or to stop it from getting worse, the kidneys may stop working permanently (end-stage renal disease or ESRD). Most of the time, CKD does not go away, but it can often be controlled. People who have CKD are usually able to live normal lives. What are the causes? The most common causes of this condition are diabetes and high blood pressure (hypertension). Other causes include:  Heart and blood vessel (cardiovascular) disease.  Kidney diseases, such as: ? Glomerulonephritis. ? Interstitial nephritis. ? Polycystic kidney disease. ? Renal vascular disease.  Diseases that affect the immune system.  Genetic diseases.  Medicines that damage the kidneys, such as anti-inflammatory medicines.  Being around or being in contact with poisonous (toxic) substances.  A kidney or urinary infection that occurs again and again (recurs).  Vasculitis. This is swelling or inflammation of the blood vessels.  A problem with urine flow that may be caused by: ? Cancer. ? Having kidney stones more than one time. ? An enlarged prostate, in males. What increases the risk? You are more likely to develop this condition if you:  Are older than age 60.  Are female.  Are African-American, Hispanic, Asian, Pacific Islander, or American Indian.  Are a current or former smoker.   Are obese.  Have a family history of kidney disease or failure.  Often take medicines that are damaging to the kidneys. What are the signs or symptoms? Symptoms of this condition include:  Swelling (edema) of the face, legs, ankles, or feet.  Tiredness (lethargy) and having less energy.  Nausea or vomiting.  Confusion or trouble concentrating.  Problems with urination, such as: ? Painful or burning feeling during urination. ? Decreased urine production. ? Frequent urination, especially at night. ? Bloody urine.  Muscle twitches and cramps, especially in the legs.  Shortness of breath.  Weakness.  Loss of appetite.  Metallic taste in the mouth.  Trouble sleeping.  Dry, itchy skin.  A low blood count (anemia).  Pale lining of the eyelids and surface of the eye (conjunctiva). Symptoms develop slowly and may not be obvious until the kidney damage becomes severe. It is possible to have kidney disease for years without having any symptoms. How is this diagnosed? This condition may be diagnosed based on:  Blood tests.  Urine tests.  Imaging tests, such as an ultrasound or CT scan.  A test in which a sample of tissue is removed from the kidneys to be examined under a microscope (kidney biopsy). These test results will help your health care provider determine how serious the CKD is. How is this treated? There is no cure for most cases of this condition, but treatment usually relieves symptoms and prevents or slows the progression of the disease. Treatment may include:  Making diet changes, which may require you to avoid alcohol, salty foods (sodium),   and foods that are high in potassium, calcium, and protein.  Medicines: ? To lower blood pressure. ? To control blood glucose. ? To relieve anemia. ? To relieve swelling. ? To protect your bones. ? To improve the balance of electrolytes in your blood.  Removing toxic waste from the body through types of dialysis, if  the kidneys can no longer do their job (kidney failure).  Managing any other conditions that are causing your CKD or making it worse. Follow these instructions at home: Medicines  Take over-the-counter and prescription medicines only as told by your health care provider. The dose of some medicines that you take may need to be adjusted.  Do not take any new medicines unless approved by your health care provider. Many medicines can worsen your kidney damage.  Do not take any vitamin and mineral supplements unless approved by your health care provider. Many nutritional supplements can worsen your kidney damage. General instructions  Follow your prescribed diet as told by your health care provider.  Do not use any products that contain nicotine or tobacco, such as cigarettes and e-cigarettes. If you need help quitting, ask your health care provider.  Monitor and track your blood pressure at home. Report changes in your blood pressure as told by your health care provider.  If you are being treated for diabetes, monitor and track your blood sugar (blood glucose) levels as told by your health care provider.  Maintain a healthy weight. If you need help with this, ask your health care provider.  Start or continue an exercise plan. Exercise at least 30 minutes a day, 5 days a week.  Keep your immunizations up to date as told by your health care provider.  Keep all follow-up visits as told by your health care provider. This is important. Where to find more information  American Association of Kidney Patients: www.aakp.org  National Kidney Foundation: www.kidney.org  American Kid[employer] Fund: www.akfinc.org  [facility]: www.lifeoptions.org and www.kidneyschool.org Contact a health care provider if:  Your symptoms get worse.  You develop new symptoms. Get help right away if:  You develop symptoms of ESRD, which include: ? Headaches. ? Numbness in the hands or  feet. ? Easy bruising. ? Frequent hiccups. ? Chest pain. ? Shortness of breath. ? Lack of menstruation, in women.  You have a fever.  You have decreased urine production.  You have pain or bleeding when you urinate. Summary  Chronic kidney disease (CKD) occurs when the kidneys become damaged slowly over a long period of time.  The most common causes of this condition are diabetes and high blood pressure (hypertension).  There is no cure for most cases of this condition, but treatment usually relieves symptoms and prevents or slows the progression of the disease. Treatment may include a combination of medicines and lifestyle changes. This information is not intended to replace advice given to you by your health care provider. Make sure you discuss any questions you have with your health care provider. Document Revised: 07/13/2017 Document Reviewed: 09/07/2016 [employer] Patient Education  2020 [employer]  

## 2020-07-28 NOTE — Progress Notes (Signed)
Established Patient Office Visit  Subjective:  Patient ID: [name], female    DOB: 1950-12-28  Age: 69 y.o. MRN: [id number]  CC:  Chief Complaint  Patient presents with  . Diabetes  . Hyperlipidemia    HPI [name] presents for medication refills   [name] is a pleasant 69 YO with complex PMH of HTN, diastolic CHF, PVS's, OSA, CKD, RA, Sjogrens, polyneuropathy, fibromyalgia, depression  [name] was last seen in primary care on 03/17/20 for HTN, HLD, CKD. She last had labs performed 2.5 months ago on 9.28.21  She is followed by Cardiology for chronic diastolic HF and OSA - has chronic SOB and takes Torsemide 40 mg daily and Spiro 25 mg bid . She takes Toprol XL 25 mg for HTN. She is compliant with CPAP nightly. Last appt 12/02/19  She is followed by [facility] for polyneuropathy and takes Gabapentin. Last appt 02/10/20  She is followed by [facility] for prediabetes and weight management  She is followed by Rheum for RA, Sjogren's, OA and fibromyalgia. She is taking Arava 20 mg (immunosuppressant), Plaquenil 200 mg bid on weekdays. Last appt 02/10/20  She is followed by a counselor for depression - she states she was told that she is lonely and not depressed. She sees them every couple of weeks  She was referred to [facility] for CKD and states she was dismissed a few years ago. Her kidney function is monitored by her Rheumatologist every 3 months now.  She has a history of hypokalemia and takes potassium 20 meq daily.  Reports she has lower back spasms that radiate to the kidney area. Has been prescribed Methocarbamol 500 mg and takes this twice a day as needed and also takes Tizanidine 4 mg nightly for fibormyalgia.  Past Medical History:  Diagnosis Date  . Arthritis    Rheumatoid arthritis,   . Back pain   . Blood transfusion    1981  . CHF (congestive heart failure) ([place])   . Chronic diastolic CHF (congestive heart failure) (HCC)     diastolic   . DDD (degenerative disc disease), cervical   . DDD (degenerative disc disease), lumbar   . Edema of both lower extremities   . Fibromyalgia   . Gallbladder problem   . GERD (gastroesophageal reflux disease)   . Heart murmur    as a child  . Hiatal hernia    sjorgens syndrome  . High blood pressure   . High cholesterol   . IBS (irritable bowel syndrome)   . Joint pain   . Numbness and tingling of both feet   . Numbness of fingers   . OSA (obstructive sleep apnea)   . Osteoarthritis   . Peripheral autonomic neuropathy of unknown cause   . Pneumonia 07/2018  . Polyneuropathy   . Pre-diabetes   . PVC (premature ventricular contraction)   . Raynaud disease   . Rheumatoid arthritis ([place])   . Sjogren's disease ([place])   . Sleep apnea   . SOB (shortness of breath)   . Swallowing difficulty   . Urticaria   . Vitamin D deficiency     Past Surgical History:  Procedure Laterality Date  . ABDOMINAL HYSTERECTOMY     BTL, BSO  . ADENOIDECTOMY    . BREAST SURGERY     mass removal   . CHOLECYSTECTOMY    . dental implants    . DIAGNOSTIC LAPAROSCOPY     x3  . KNEE ARTHROSCOPY  x2  . MASS EXCISION Left 03/22/2017   Procedure: EXCISION OF LEFT BREAST MASS;  Surgeon: Donnie Mesa, MD;  Location: [facility];  Service: General;  Laterality: Left;  . patotid cystectomy    . RIGHT/LEFT HEART CATH AND CORONARY ANGIOGRAPHY N/A 07/25/2019   Procedure: RIGHT/LEFT HEART CATH AND CORONARY ANGIOGRAPHY;  Surgeon: Jolaine Artist, MD;  Location: [facility];  Service: Cardiovascular;  Laterality: N/A;  . TENDON REPAIR  1980   left ankle and tibia  . TONSILLECTOMY    . TOTAL KNEE ARTHROPLASTY  06/21/2011   Procedure: TOTAL KNEE ARTHROPLASTY;  Surgeon: Gearlean Alf;  Location: [facility];  Service: Orthopedics;  Laterality: Right;  . TUBAL LIGATION      Family History  Problem Relation Age of Onset  . Alzheimer's disease Mother   . Heart attack Mother   . Hypertension  Mother   . Glaucoma Mother   . High Cholesterol Mother   . Heart disease Mother   . Depression Mother   . Cancer Father   . High Cholesterol Father   . Heart disease Father   . Sleep apnea Father   . Heart attack Brother   . Heart disease Brother   . Glaucoma Brother   . Hyperlipidemia Brother   . Glaucoma Brother   . Asthma Son   . Allergic rhinitis Neg Hx   . Angioedema Neg Hx   . Eczema Neg Hx   . Urticaria Neg Hx     Social History   Socioeconomic History  . Marital status: Widowed    Spouse name: Not on file  . Number of children: 2  . Years of education: AS  . Highest education level: Not on file  Occupational History  . Occupation: retired Marine scientist  . Occupation: Therapist, sports  Tobacco Use  . Smoking status: Never Smoker  . Smokeless tobacco: Never Used  Vaping Use  . Vaping Use: Never used  Substance and Sexual Activity  . Alcohol use: Yes    Comment: socially   . Drug use: No  . Sexual activity: Never  Other Topics Concern  . Not on file  Social History Narrative   Lives at home alone   Right-handed   Drinks 1 or less cups of coffee and 2 servings of either tea or soda per day   Social Determinants of Health   Financial Resource Strain: Not on file  Food Insecurity: Not on file  Transportation Needs: Not on file  Physical Activity: Not on file  Stress: Not on file  Social Connections: Not on file  Intimate Partner Violence: Not on file    Outpatient Medications Prior to Visit  Medication Sig Dispense Refill  . albuterol (VENTOLIN HFA) 108 (90 Base) MCG/ACT inhaler Inhale 2 puffs into the lungs every 4 (four) hours as needed for wheezing or shortness of breath. 18 g 0  . aspirin EC 81 MG tablet Take 81 mg by mouth at bedtime.    [name] atorvastatin (LIPITOR) 20 MG tablet TAKE 1 TABLET BY MOUTH DAILY. 90 tablet 0  . Calcium Carbonate-Vit D-Min (GNP CALCIUM 1200) 1200-1000 MG-UNIT CHEW Calcium 1200mg  qd OTC 30 tablet   . cycloSPORINE (RESTASIS) 0.05 % ophthalmic  emulsion Place 2 drops into both eyes 2 (two) times daily.    . diphenoxylate-atropine (LOMOTIL) 2.5-0.025 MG per tablet Take 1 tablet by mouth 4 (four) times daily as needed for diarrhea or loose stools.    . fluticasone (FLONASE) 50 MCG/ACT nasal spray Place  1 spray into both nostrils daily. 16 g 0  . hyoscyamine (ANASPAZ) 0.125 MG TBDP disintergrating tablet Place 0.25 mg under the tongue every 6 (six) hours as needed for cramping.     [name] levocetirizine (XYZAL) 5 MG tablet Take 1 tablet (5 mg total) by mouth every evening. 90 tablet 0  . lidocaine (LIDODERM) 5 % PLACE 1 PATCH ONTO THE SKIN DAILY AS NEEDED FOR PAIN. REMOVE AND DISCARD PATCH WITHIN 12 HOURS OR AS DIRECTED BY MD 90 patch 0  . methocarbamol (ROBAXIN) 500 MG tablet TAKE 1 TABLET BY MOUTH DAILY AS NEEDED FOR MUSCLE SPASMS. 30 tablet 0  . metoprolol succinate (TOPROL-XL) 25 MG 24 hr tablet TAKE 1 TABLET BY MOUTH DAILY. PLEASE KEEP UPCOMING APPT WITH DR. Radford Pax IN [facility] FOR FUTURE REFILLS. THANK YOU 90 tablet 3  . NONFORMULARY OR COMPOUNDED ITEM Triamcinolone 0.1% & Silvadene cream 1:1- Apply as directed to affected areas as needed    . NONFORMULARY OR COMPOUNDED ITEM     . potassium chloride (KLOR-CON) 10 MEQ tablet Take 2 tablets (20 mEq total) by mouth daily. 186 tablet 3  . Probiotic Product (PROBIOTIC DAILY PO) Take by mouth. Ultraflora IB Probiotic    . promethazine (PHENERGAN) 25 MG tablet TAKE 1 TABLET BY MOUTH EVERY 6 HOURS AS NEEDED. 120 tablet 0  . spironolactone (ALDACTONE) 25 MG tablet TAKE 1 TABLET BY MOUTH 2 TIMES DAILY. 180 tablet 3  . torsemide (DEMADEX) 20 MG tablet Take 1 tablet (20 mg total) by mouth 2 (two) times daily. 60 tablet 3  . UNABLE TO FIND CPAP: AT bedtime; setting is "12"    . Vitamin D, Ergocalciferol, (DRISDOL) 1.25 MG (50000 UNIT) CAPS capsule TAKE 1 CAPSULE BY MOUTH EVERY 7 DAYS. 12 capsule 0  . Biotin w/ Vitamins C & E (HAIR SKIN & NAILS GUMMIES PO) Take by mouth.    . gabapentin (NEURONTIN) 300 MG  capsule TAKE 2 CAPSULES BY MOUTH IN THE MORNING AND 3 CAPSULES AT BEDTIME (Patient taking differently: TAKE 2 CAPSULES BY MOUTH IN THE MORNING AND 2 CAPSULES AT BEDTIME) 450 capsule 1  . hydroxychloroquine (PLAQUENIL) 200 MG tablet TAKE 1 TABLET BY MOUTH TWICE A DAY MONDAY THROUGH FRIDAY. 120 tablet 0  . leflunomide (ARAVA) 20 MG tablet TAKE 1 TABLET BY MOUTH DAILY. 90 tablet 0  . tiZANidine (ZANAFLEX) 4 MG tablet TAKE 1 TABLET BY MOUTH AT BEDTIME. 90 tablet 0   No facility-administered medications prior to visit.    Allergies  Allergen Reactions  . Sulfa Antibiotics Hives and Itching    Flushing also  . Cymbalta [Duloxetine Hcl] Other (See Comments)    Caused a manic reaction  . Demerol Hives and Other (See Comments)    Fever also  . Ivp Dye [Iodinated Diagnostic Agents] Hives and Other (See Comments)    Fever also  . Morphine And Related Other (See Comments)    ineffective  . Sulfasalazine Other (See Comments)    Reaction ??  . Adhesive [Tape] Rash    ROS Review of Systems  Musculoskeletal: Positive for arthralgias, back pain, joint swelling and myalgias.  All other systems reviewed and are negative.     Objective:    Physical Exam Vitals reviewed.  Constitutional:      Appearance: She is not ill-appearing.  Cardiovascular:     Rate and Rhythm: Normal rate and regular rhythm.     Heart sounds: No murmur heard.   Pulmonary:     Effort: Pulmonary effort is  normal.     Breath sounds: Normal breath sounds.  Musculoskeletal:        General: Tenderness (bilateral lumbar area) and deformity (dextroscoliosis) present.  Skin:    General: Skin is warm and dry.  Neurological:     Mental Status: She is alert.  Psychiatric:        Mood and Affect: Mood normal.        Behavior: Behavior normal.        Thought Content: Thought content normal.     BP 105/69   Pulse 71   Temp 99.5 F (37.5 C) (Oral)   Ht 5\' 1"  (1.549 m)   Wt 212 lb 6.4 oz (96.3 kg)   SpO2 97%  Comment: on RA  BMI 40.13 kg/m  Wt Readings from Last 3 Encounters:  07/28/20 212 lb 6.4 oz (96.3 kg)  07/22/20 208 lb (94.3 kg)  06/24/20 209 lb (94.8 kg)     There are no preventive care reminders to display for this patient.  There are no preventive care reminders to display for this patient.  Lab Results  Component Value Date   TSH 1.550 12/01/2019   Lab Results  Component Value Date   WBC 7.7 05/11/2020   HGB 13.2 05/11/2020   HCT 40.0 05/11/2020   MCV 87 05/11/2020   PLT 181 05/11/2020   Lab Results  Component Value Date   NA 140 07/28/2020   K 4.4 07/28/2020   CO2 24 07/28/2020   GLUCOSE 99 07/28/2020   BUN 19 07/28/2020   CREATININE 0.99 07/28/2020   BILITOT 0.5 07/28/2020   ALKPHOS 119 07/28/2020   AST 21 07/28/2020   ALT 15 07/28/2020   PROT 6.5 07/28/2020   ALBUMIN 4.1 07/28/2020   CALCIUM 9.7 07/28/2020   ANIONGAP 13 08/05/2019   GFR 72.81 04/27/2015   Lab Results  Component Value Date   CHOL 137 12/01/2019   Lab Results  Component Value Date   HDL 44 12/01/2019   Lab Results  Component Value Date   LDLCALC 58 12/01/2019   Lab Results  Component Value Date   TRIG 213 (H) 12/01/2019   Lab Results  Component Value Date   CHOLHDL 3.5 08/26/2019   Lab Results  Component Value Date   HGBA1C 5.8 (H) 05/11/2020   Urinalysis    Component Value Date/Time   COLORURINE YELLOW 06/13/2011 1230   APPEARANCEUR CLOUDY (A) 06/13/2011 1230   LABSPEC 1.018 06/13/2011 1230   PHURINE 6.0 06/13/2011 1230   GLUCOSEU NEGATIVE 06/13/2011 1230   Aline 06/13/2011 1230   BILIRUBINUR negative 07/28/2020 1351   KETONESUR NEGATIVE 06/13/2011 1230   PROTEINUR Positive (A) 07/28/2020 1351   PROTEINUR NEGATIVE 06/13/2011 1230   UROBILINOGEN 0.2 07/28/2020 1351   UROBILINOGEN 0.2 06/13/2011 1230   NITRITE negative 07/28/2020 1351   NITRITE NEGATIVE 06/13/2011 1230   LEUKOCYTESUR Trace (A) 07/28/2020 1351       Assessment & Plan:   Problem  List Items Addressed This Visit      Other   Mixed hyperlipidemia - Primary   Low serum HDL (Chronic)   Fibromyalgia (Chronic)   Relevant Medications   AMBULATORY NON FORMULARY MEDICATION   Family history of coronary arteriosclerosis- strong fam h/o CAD and early CAD.  (Chronic)    Other Visit Diagnoses    Renal insufficiency       Relevant Orders   Comprehensive metabolic panel (Completed)   POCT UA - Microalbumin (Completed)   POCT Urinalysis Dipstick (Completed)  Muscle spasm of back       Relevant Medications   AMBULATORY NON FORMULARY MEDICATION      Meds ordered this encounter  Medications  . AMBULATORY NON FORMULARY MEDICATION    Sig: Electrostimulation x 30 mins three times daily as needed for musculoskeletal pain/spasm    Dispense:  1 each    Refill:  0    Fax Attn Larene [place] Doctor [phone]    Order Specific Question:   Supervising Provider    Answer:   Emeterio Reeve [[id number]]   Renal insufficiency Scr range (12 mos) 0.89-1.24, GFR 45-74 Microalbuminuria present on exam today UA + proteinuria CMP pending No anemia Continue to monitor Q4mos Avoid nephrotoxins CKD handout provided on AVS  Muscle spasm of back Polypharmacy - she is already taking 2 different muscle relaxers Discussed alternative therapies to include home rehab exercises, formal PT and e-stim/TENS unit Rx for TENS - apply to the skin x 30 mins tid prn spasm  Follow-up: Return in about 6 months (around 01/26/2021).     Trixie Dredge, [place]

## 2020-07-29 LAB — COMPREHENSIVE METABOLIC PANEL
ALT: 15 IU/L (ref 0–32)
AST: 21 IU/L (ref 0–40)
Albumin/Globulin Ratio: 1.7 (ref 1.2–2.2)
Albumin: 4.1 g/dL (ref 3.8–4.8)
Alkaline Phosphatase: 119 IU/L (ref 44–121)
BUN/Creatinine Ratio: 19 (ref 12–28)
BUN: 19 mg/dL (ref 8–27)
Bilirubin Total: 0.5 mg/dL (ref 0.0–1.2)
CO2: 24 mmol/L (ref 20–29)
Calcium: 9.7 mg/dL (ref 8.7–10.3)
Chloride: 102 mmol/L (ref 96–106)
Creatinine, Ser: 0.99 mg/dL (ref 0.57–1.00)
GFR calc Af Amer: 68 mL/min/{1.73_m2} (ref >59)
GFR calc non Af Amer: 59 mL/min/{1.73_m2} — ABNORMAL LOW (ref >59)
Globulin, Total: 2.4 g/dL (ref 1.5–4.5)
Glucose: 99 mg/dL (ref 65–99)
Potassium: 4.4 mmol/L (ref 3.5–5.2)
Sodium: 140 mmol/L (ref 134–144)
Total Protein: 6.5 g/dL (ref 6.0–8.5)

## 2020-07-29 MED ORDER — LEFLUNOMIDE 20 MG PO TABS
20.0000 mg | ORAL_TABLET | Freq: Every day | ORAL | 0 refills | Status: DC
Start: 2020-07-29 — End: 2020-11-04

## 2020-07-29 MED ORDER — HYDROXYCHLOROQUINE SULFATE 200 MG PO TABS: ORAL_TABLET | ORAL | 0 refills | Status: DC

## 2020-07-29 MED ORDER — TIZANIDINE HCL 4 MG PO TABS
4.0000 mg | ORAL_TABLET | Freq: Every day | ORAL | 0 refills | Status: DC
Start: 2020-07-29 — End: 2020-11-04

## 2020-07-29 MED ORDER — GABAPENTIN 300 MG PO CAPS: 600.0000 mg | ORAL_CAPSULE | Freq: Two times a day (BID) | ORAL | 1 refills | Status: DC

## 2020-07-29 NOTE — Telephone Encounter (Signed)
Last Visit: 04/01/2020 Next Visit: 09/02/2020 Labs: 05/11/2020 Creat. 1.17, GFR 48, CO2 19 PLQ Eye Exam: 01/14/2020 WNL  Current Dose per office note 04/01/2020: Arava 20 mg daily and Plaquenil 200 mg 1 tablet by mouth twice daily Monday through Friday.  Zanaflex 4 mg by mouth at bedtime as needed DX: Rheumatoid arthritis involving multiple sites with positive rheumatoid factor Fibromyalgia   Okay to refill Arava, PLQ and Tizanidine?

## 2020-07-29 NOTE — Telephone Encounter (Signed)
Pt taking 600mg  po bid. Changed prescription to this amount.  Was taking 5 daily previously.

## 2020-07-29 NOTE — Telephone Encounter (Signed)
I will refill medications.  Please notify patient that her labs are due this month which should include CBC and CMP.

## 2020-08-01 DIAGNOSIS — M62830 Muscle spasm of back: Secondary | ICD-10-CM | POA: Insufficient documentation

## 2020-08-01 DIAGNOSIS — N289 Disorder of kidney and ureter, unspecified: Secondary | ICD-10-CM | POA: Insufficient documentation

## 2020-08-01 DIAGNOSIS — R809 Proteinuria, unspecified: Secondary | ICD-10-CM | POA: Insufficient documentation

## 2020-08-01 DIAGNOSIS — N1832 Chronic kidney disease, stage 3b: Secondary | ICD-10-CM | POA: Diagnosis present

## 2020-08-11 ENCOUNTER — Ambulatory Visit: Payer: PPO | Admitting: Adult Health

## 2020-08-19 ENCOUNTER — Other Ambulatory Visit: Payer: Self-pay

## 2020-08-19 ENCOUNTER — Encounter (INDEPENDENT_AMBULATORY_CARE_PROVIDER_SITE_OTHER): Payer: Self-pay | Admitting: Family Medicine

## 2020-08-19 ENCOUNTER — Ambulatory Visit (INDEPENDENT_AMBULATORY_CARE_PROVIDER_SITE_OTHER): Payer: PPO | Admitting: Family Medicine

## 2020-08-19 VITALS — BP 111/59 | HR 73 | Temp 98.0°F | Ht 61.0 in | Wt 208.0 lb

## 2020-08-19 DIAGNOSIS — Z6839 Body mass index (BMI) 39.0-39.9, adult: Secondary | ICD-10-CM | POA: Diagnosis not present

## 2020-08-19 DIAGNOSIS — R7303 Prediabetes: Secondary | ICD-10-CM | POA: Diagnosis not present

## 2020-08-19 DIAGNOSIS — I5030 Unspecified diastolic (congestive) heart failure: Secondary | ICD-10-CM

## 2020-08-19 NOTE — Progress Notes (Signed)
Chief Complaint:   OBESITY [name] is here to discuss her progress with her obesity treatment plan along with follow-up of her obesity related diagnoses. [name] is on Category 2 and 3 Plan aand states she is following her eating plan approximately 60% of the time. [name] states she is walking 1-2 miles.  Today's visit was #: 14 Starting weight: 239 lbs Starting date: 12/01/2019 Today's weight: 208 lbs Today's date: 08/19/2020 Total lbs lost to date: 31 lbs Total lbs lost since last in-office visit: 0  Interim History: [name] is considering going to [place] for winter as she is tired of being in [place]. She did not follow plan as strictly as she was previously. [name] has realized that her tastes have changed. She is still not able to eat all foods from plan.  Subjective:   1. Diastolic congestive heart failure, unspecified HF chronicity (HCC) [name] sees Cardiology and she is on medication.  2. Prediabetes [name] has a diagnosis of prediabetes based on her elevated HgA1c and was informed this puts her at greater risk of developing diabetes. She continues to work on diet and exercise to decrease her risk of diabetes. She denies nausea or hypoglycemia. Last A1c was 5.8 and insulin 21.0. She is not on medication.   Lab Results  Component Value Date   HGBA1C 5.8 (H) 05/11/2020   Lab Results  Component Value Date   INSULIN 21.0 06/01/2020   INSULIN 32.3 (H) 12/01/2019      Assessment/Plan:   1. Diastolic congestive heart failure, unspecified HF chronicity (HCC) [name] will follow up with Dr Radford Pax. No change in medications.  2. Prediabetes Will do follow up labs in March.  3. Class 2 severe obesity with serious comorbidity and body mass index (BMI) of 39.0 to 39.9 in adult, unspecified obesity type (HCC)  [name] is currently in the action stage of change. As such, her goal is to continue with weight loss efforts. She has agreed to the Category 2 Plan or the Category 3 Plan.  Exercise  goals: Some exercise.  Behavioral modification strategies: increasing lean protein intake, meal planning and cooking strategies and keeping healthy foods in the home.  [name] has agreed to follow-up with our clinic in 3 weeks. She was informed of the importance of frequent follow-up visits to maximize her success with intensive lifestyle modifications for her multiple health conditions.    Objective:   Blood pressure (!) 111/59, pulse 73, temperature 98 F (36.7 C), temperature source Oral, height 5\' 1"  (1.549 m), weight 208 lb (94.3 kg), SpO2 98 %. Body mass index is 39.3 kg/m.  General: Cooperative, alert, well developed, in no acute distress. HEENT: Conjunctivae and lids unremarkable. Cardiovascular: Regular rhythm.  Lungs: Normal work of breathing. Neurologic: No focal deficits.   Lab Results  Component Value Date   CREATININE 0.99 07/28/2020   BUN 19 07/28/2020   NA 140 07/28/2020   K 4.4 07/28/2020   CL 102 07/28/2020   CO2 24 07/28/2020   Lab Results  Component Value Date   ALT 15 07/28/2020   AST 21 07/28/2020   ALKPHOS 119 07/28/2020   BILITOT 0.5 07/28/2020   Lab Results  Component Value Date   HGBA1C 5.8 (H) 05/11/2020   HGBA1C 6.2 (H) 12/01/2019   HGBA1C 6.1 (H) 08/26/2019   HGBA1C 5.6 08/20/2018   HGBA1C 5.4 03/21/2018   Lab Results  Component Value Date   INSULIN 21.0 06/01/2020   INSULIN 32.3 (H) 12/01/2019   Lab Results  Component Value Date   TSH 1.550 12/01/2019   Lab Results  Component Value Date   CHOL 137 12/01/2019   HDL 44 12/01/2019   LDLCALC 58 12/01/2019   TRIG 213 (H) 12/01/2019   CHOLHDL 3.5 08/26/2019   Lab Results  Component Value Date   WBC 7.7 05/11/2020   HGB 13.2 05/11/2020   HCT 40.0 05/11/2020   MCV 87 05/11/2020   PLT 181 05/11/2020   No results found for: IRON, TIBC, FERRITIN  Obesity Behavioral Intervention:   Approximately 15 minutes were spent on the discussion below.  ASK: We discussed the diagnosis of  obesity with [name] today and [name] agreed to give [place] permission to discuss obesity behavioral modification therapy today.  ASSESS: [name] has the diagnosis of obesity and her BMI today is 39.5. [name] is in the action stage of change.   ADVISE: [name] was educated on the multiple health risks of obesity as well as the benefit of weight loss to improve her health. She was advised of the need for long term treatment and the importance of lifestyle modifications to improve her current health and to decrease her risk of future health problems.  AGREE: Multiple dietary modification options and treatment options were discussed and [name] agreed to follow the recommendations documented in the above note.  ARRANGE: [name] was educated on the importance of frequent visits to treat obesity as outlined per CMS and USPSTF guidelines and agreed to schedule her next follow up appointment today.  Attestation Statements:   Reviewed by clinician on day of visit: allergies, medications, problem list, medical history, surgical history, family history, social history, and previous encounter notes.  Time spent on visit including pre-visit chart review and post-visit care and charting was 15 minutes.   I, Para March, am acting as transcriptionist for Coralie Common, MD.  I have reviewed the above documentation for accuracy and completeness, and I agree with the above. - Jinny Blossom, MD

## 2020-08-19 NOTE — Progress Notes (Deleted)
Office Visit Note  Patient: [name]             Date of Birth: [birth year]           MRN: [id number]             PCP: Lorrene Reid, PA-C Referring: Lorrene Reid, PA-C Visit Date: 09/02/2020 Occupation: @GUAROCC @  Subjective:  No chief complaint on file.   History of Present Illness: [name] is a 70 y.o. female ***   Activities of Daily Living:  Patient reports morning stiffness for *** {minute/hour:19697}.   Patient {ACTIONS;DENIES/REPORTS:21021675::"Denies"} nocturnal pain.  Difficulty dressing/grooming: {ACTIONS;DENIES/REPORTS:21021675::"Denies"} Difficulty climbing stairs: {ACTIONS;DENIES/REPORTS:21021675::"Denies"} Difficulty getting out of chair: {ACTIONS;DENIES/REPORTS:21021675::"Denies"} Difficulty using hands for taps, buttons, cutlery, and/or writing: {ACTIONS;DENIES/REPORTS:21021675::"Denies"}  No Rheumatology ROS completed.   PMFS History:  Patient Active Problem List   Diagnosis Date Noted  . Renal insufficiency 08/01/2020  . Muscle spasm of back 08/01/2020  . Microalbuminuria 08/01/2020  . Post-menopausal 09/02/2019  . Pain in left knee 08/23/2018  . Lumbar pain 07/04/2018  . Recurrent urticaria 05/20/2018  . Rhinitis 05/20/2018  . Vitamin D deficiency 11/20/2017  . Mixed hyperlipidemia 11/20/2017  . Family history of coronary arteriosclerosis- strong fam h/o CAD and early CAD.  07/17/2017  . Raynaud's disease without gangrene 03/12/2017  . Abnormal weight gain 02/06/2017  . Shingles outbreak 02/06/2017  . Cyst (solitary) of breast, left 01/31/2017  . Eosinophilic esophagitis 42/68/3419  . History of hyperlipidemia 10/04/2016  . 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 History of diverticulitis 09/03/2016  . Osteoporosis 09/03/2016  . Autoimmune disease ([place]) 09/02/2016  . High risk medication use 09/02/2016  . History of esophagitis 09/02/2016  . Elevated triglycerides with high cholesterol 08/23/2016  . Low serum HDL  08/23/2016  . Breast cyst, left 06/14/2016  . Encounter for wellness examination 05/23/2016  . Abnormality of gait 05/09/2016  . Sjoegren syndrome 02/29/2016  . GERD (gastroesophageal reflux disease) 01/19/2016  . Glucose intolerance (impaired glucose tolerance) 01/19/2016  . Chronic diastolic CHF (congestive heart failure) ([place]) 04/20/2015  . Peripheral polyneuropathy 02/02/2014  . Morbid obesity ([place]) 11/17/2013  . Heart murmur   . OSA (obstructive sleep apnea)   . Rheumatoid arthritis ([place])   . Hiatal hernia   . Fibromyalgia   . PVC's (premature ventricular contractions)   . 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 Unilateral primary osteoarthritis, left knee 06/22/2011    Past Medical History:  Diagnosis Date  . Arthritis    Rheumatoid arthritis,   . Back pain   . Blood transfusion    1981  . CHF (congestive heart failure) ([place])   . Chronic diastolic CHF (congestive heart failure) ([facility])    diastolic   . DDD (degenerative disc disease), cervical   . DDD (degenerative disc disease), lumbar   . Edema of both lower extremities   . Fibromyalgia   . Gallbladder problem   . GERD (gastroesophageal reflux disease)   . Heart murmur    as a child  . Hiatal hernia    sjorgens syndrome  . High blood pressure   . High cholesterol   . IBS (irritable bowel syndrome)   . Joint pain   . Numbness and tingling of both feet   . Numbness of fingers   . OSA (obstructive sleep apnea)   . Osteoarthritis   . Peripheral autonomic neuropathy of unknown cause   . Pneumonia 07/2018  . Polyneuropathy   . Pre-diabetes   . PVC (  premature ventricular contraction)   . Raynaud disease   . Rheumatoid arthritis ([place])   . Sjogren's disease ([place])   . Sleep apnea   . SOB (shortness of breath)   . Swallowing difficulty   . Urticaria   . Vitamin D deficiency     Family History  Problem Relation Age of Onset  . Alzheimer's disease Mother   . Heart attack Mother   . Hypertension  Mother   . Glaucoma Mother   . High Cholesterol Mother   . Heart disease Mother   . Depression Mother   . Cancer Father   . High Cholesterol Father   . Heart disease Father   . Sleep apnea Father   . Heart attack Brother   . Heart disease Brother   . Glaucoma Brother   . Hyperlipidemia Brother   . Glaucoma Brother   . Asthma Son   . Allergic rhinitis Neg Hx   . Angioedema Neg Hx   . Eczema Neg Hx   . Urticaria Neg Hx    Past Surgical History:  Procedure Laterality Date  . ABDOMINAL HYSTERECTOMY     BTL, BSO  . ADENOIDECTOMY    . BREAST SURGERY     mass removal   . CHOLECYSTECTOMY    . dental implants    . DIAGNOSTIC LAPAROSCOPY     x3  . KNEE ARTHROSCOPY     x2  . MASS EXCISION Left 03/22/2017   Procedure: EXCISION OF LEFT BREAST MASS;  Surgeon: Donnie Mesa, MD;  Location: [facility];  Service: General;  Laterality: Left;  . patotid cystectomy    . RIGHT/LEFT HEART CATH AND CORONARY ANGIOGRAPHY N/A 07/25/2019   Procedure: RIGHT/LEFT HEART CATH AND CORONARY ANGIOGRAPHY;  Surgeon: Jolaine Artist, MD;  Location: [facility];  Service: Cardiovascular;  Laterality: N/A;  . TENDON REPAIR  1980   left ankle and tibia  . TONSILLECTOMY    . TOTAL KNEE ARTHROPLASTY  06/21/2011   Procedure: TOTAL KNEE ARTHROPLASTY;  Surgeon: Gearlean Alf;  Location: [facility];  Service: Orthopedics;  Laterality: Right;  . TUBAL LIGATION     Social History   Social History Narrative   Lives at home alone   Right-handed   Drinks 1 or less cups of coffee and 2 servings of either tea or soda per day   Immunization History  Administered Date(s) Administered  . Influenza, High Dose Seasonal PF 06/12/2018, 05/17/2019  . Influenza,inj,Quad PF,6+ Mos 05/23/2016, 06/01/2017  . Influenza-Unspecified 06/05/2018, 05/17/2019, 06/07/2020  . PFIZER SARS-COV-2 Vaccination 09/03/2019, 09/24/2019, 04/01/2020  . Pneumococcal Conjugate-13 07/02/2017  . Pneumococcal Polysaccharide-23 05/17/2005  .  Td 10/16/2007  . Tdap 03/21/2018  . Zoster Recombinat (Shingrix) 06/10/2019, 12/11/2019     Objective: Vital Signs: There were no vitals taken for this visit.   Physical Exam   Musculoskeletal Exam: ***  CDAI Exam: CDAI Score: -- Patient Global: --; Provider Global: -- Swollen: --; Tender: -- Joint Exam 09/02/2020   No joint exam has been documented for this visit   There is currently no information documented on the homunculus. Go to the Rheumatology activity and complete the homunculus joint exam.  Investigation: No additional findings.  Imaging: No results found.  Recent Labs: Lab Results  Component Value Date   WBC 7.7 05/11/2020   HGB 13.2 05/11/2020   PLT 181 05/11/2020   NA 140 07/28/2020   K 4.4 07/28/2020   CL 102 07/28/2020   CO2 24 07/28/2020   GLUCOSE  99 07/28/2020   BUN 19 07/28/2020   CREATININE 0.99 07/28/2020   BILITOT 0.5 07/28/2020   ALKPHOS 119 07/28/2020   AST 21 07/28/2020   ALT 15 07/28/2020   PROT 6.5 07/28/2020   ALBUMIN 4.1 07/28/2020   CALCIUM 9.7 07/28/2020   GFRAA 68 07/28/2020    Speciality Comments: PLQ Eye Exam: 01/14/2020 MCR@ [facility] follow up in 1 year  Procedures:  No procedures performed Allergies: Sulfa antibiotics, Cymbalta [duloxetine hcl], Demerol, Ivp dye [iodinated diagnostic agents], Morphine and related, Sulfasalazine, and Adhesive [tape]   Assessment / Plan:     Visit Diagnoses: No diagnosis found.  Orders: No orders of the defined types were placed in this encounter.  No orders of the defined types were placed in this encounter.   Face-to-face time spent with patient was *** minutes. Greater than 50% of time was spent in counseling and coordination of care.  Follow-Up Instructions: No follow-ups on file.   Earnestine Mealing, CMA  Note - This record has been created using Editor, commissioning.  Chart creation errors have been sought, but may not always  have been located. Such creation errors  do not reflect on  the standard of medical care.

## 2020-08-23 ENCOUNTER — Ambulatory Visit (INDEPENDENT_AMBULATORY_CARE_PROVIDER_SITE_OTHER): Payer: PPO | Admitting: Psychologist

## 2020-08-23 DIAGNOSIS — F320 Major depressive disorder, single episode, mild: Secondary | ICD-10-CM

## 2020-08-24 DIAGNOSIS — G4733 Obstructive sleep apnea (adult) (pediatric): Secondary | ICD-10-CM | POA: Diagnosis not present

## 2020-08-25 ENCOUNTER — Ambulatory Visit: Payer: PPO | Admitting: Adult Health

## 2020-08-25 ENCOUNTER — Encounter: Payer: Self-pay | Admitting: Adult Health

## 2020-08-25 VITALS — BP 124/84 | HR 74 | Ht 61.0 in | Wt 212.0 lb

## 2020-08-25 DIAGNOSIS — G629 Polyneuropathy, unspecified: Secondary | ICD-10-CM

## 2020-08-25 MED ORDER — GABAPENTIN 300 MG PO CAPS: 600.0000 mg | ORAL_CAPSULE | Freq: Two times a day (BID) | ORAL | 3 refills | Status: DC

## 2020-08-25 NOTE — Patient Instructions (Signed)
Your Plan:  Continue gabapentin  If your symptoms worsen or you develop new symptoms please let us know.    Thank you for coming to see us at [facility]. I hope we have been able to provide you high quality care today.  You may receive a patient satisfaction survey over the next few weeks. We would appreciate your feedback and comments so that we may continue to improve ourselves and the health of our patients.  

## 2020-08-25 NOTE — Progress Notes (Signed)
PATIENT: [name] DOB: 1951-08-06  REASON FOR VISIT: follow up HISTORY FROM: patient  HISTORY OF PRESENT ILLNESS: Today 08/25/20:  [name] is a 70 year old female with a history of peripheral neuropathy.  She remains on gabapentin 600 mg twice a day.  She states that she continues to have some discomfort in the lower extremities but she feels it is manageable.  She would like to continue to wean off of gabapentin but does not feel she will be able to tolerate the discomfort if she chooses to do so.  The patient denies any significant changes with her gait or balance.  Denies any recent falls.  She does have a cane or walker but only uses it if she feels that she needs it.  She returns today for follow-up.  6/29/21[name] is a 70 year old female with a history of peripheral neuropathy.  She returns today for follow-up.  Reports that she was able to wean herself off of carbamazepine.  She remains on gabapentin 600 mg in the morning and 900 mg at bedtime.  She states that this controls her discomfort.  Reports that she does have numbness and tingling in the lower extremities.  Denies any significant changes with her gait or balance.  No falls.  She returns today for an evaluation.  HISTORY 09/02/18 :  [name] is a 70 year old female with a history of rheumatoid arthritis and peripheral neuropathy.  She returns today for follow-up.  She is currently taking carbamazepine and gabapentin.  She reports that this is managing her neuropathy pain well.  She states that she still has some numbness and tingling throughout the day but it is manageable.  She denies any significant changes with her gait or balance.  She did have a fall back in November while in [place].  She states that she did break some vertebrae but has seen orthopedist and has since been released.  She states that she will use a cane and walker only for long distances.  She states that she was given lidocaine patches by her  rheumatologist for RA.  She reports that she is used those on her feet and legs and it is beneficial for her neuropathy.  She would like [place] to try to refill this to see if it will be approved by her insurance.  Patient also states that gabapentin seems to have decreased her libido.  She expressed that she would like to wean off this medication as long as her pain is under control.  She returns today for evaluation.  REVIEW OF SYSTEMS: Out of a complete 14 system review of symptoms, the patient complains only of the following symptoms, and all other reviewed systems are negative.  See HPI  ALLERGIES: Allergies  Allergen Reactions  . Sulfa Antibiotics Hives and Itching    Flushing also  . Cymbalta [Duloxetine Hcl] Other (See Comments)    Caused a manic reaction  . Demerol Hives and Other (See Comments)    Fever also  . Ivp Dye [Iodinated Diagnostic Agents] Hives and Other (See Comments)    Fever also  . Morphine And Related Other (See Comments)    ineffective  . Sulfasalazine Other (See Comments)    Reaction ??  . Adhesive [Tape] Rash    HOME MEDICATIONS: Outpatient Medications Prior to Visit  Medication Sig Dispense Refill  . albuterol (VENTOLIN HFA) 108 (90 Base) MCG/ACT inhaler Inhale 2 puffs into the lungs every 4 (four) hours as needed for wheezing or shortness  of breath. 18 g 0  . AMBULATORY NON FORMULARY MEDICATION Electrostimulation x 30 mins three times daily as needed for musculoskeletal pain/spasm 1 each 0  . aspirin EC 81 MG tablet Take 81 mg by mouth at bedtime.    [name] atorvastatin (LIPITOR) 20 MG tablet TAKE 1 TABLET BY MOUTH DAILY. 90 tablet 0  . Calcium Carbonate-Vit D-Min (GNP CALCIUM 1200) 1200-1000 MG-UNIT CHEW Calcium 1200mg  qd OTC 30 tablet   . cycloSPORINE (RESTASIS) 0.05 % ophthalmic emulsion Place 2 drops into both eyes 2 (two) times daily.    . diphenoxylate-atropine (LOMOTIL) 2.5-0.025 MG per tablet Take 1 tablet by mouth 4 (four) times daily as needed for  diarrhea or loose stools.    . fluticasone (FLONASE) 50 MCG/ACT nasal spray Place 1 spray into both nostrils daily. 16 g 0  . gabapentin (NEURONTIN) 300 MG capsule Take 2 capsules (600 mg total) by mouth 2 (two) times daily. 360 capsule 1  . hydroxychloroquine (PLAQUENIL) 200 MG tablet Take 1 tab po BID Monday-Friday only. 120 tablet 0  . hyoscyamine (ANASPAZ) 0.125 MG TBDP disintergrating tablet Place 0.25 mg under the tongue every 6 (six) hours as needed for cramping.     . leflunomide (ARAVA) 20 MG tablet Take 1 tablet (20 mg total) by mouth daily. 90 tablet 0  . levocetirizine (XYZAL) 5 MG tablet Take 1 tablet (5 mg total) by mouth every evening. 90 tablet 0  . lidocaine (LIDODERM) 5 % PLACE 1 PATCH ONTO THE SKIN DAILY AS NEEDED FOR PAIN. REMOVE AND DISCARD PATCH WITHIN 12 HOURS OR AS DIRECTED BY MD 90 patch 0  . methocarbamol (ROBAXIN) 500 MG tablet TAKE 1 TABLET BY MOUTH DAILY AS NEEDED FOR MUSCLE SPASMS. 30 tablet 0  . metoprolol succinate (TOPROL-XL) 25 MG 24 hr tablet TAKE 1 TABLET BY MOUTH DAILY. PLEASE KEEP UPCOMING APPT WITH DR. Radford Pax IN [place] FOR FUTURE REFILLS. THANK YOU 90 tablet 3  . NONFORMULARY OR COMPOUNDED ITEM Triamcinolone 0.1% & Silvadene cream 1:1- Apply as directed to affected areas as needed    . NONFORMULARY OR COMPOUNDED ITEM     . potassium chloride (KLOR-CON) 10 MEQ tablet Take 2 tablets (20 mEq total) by mouth daily. 186 tablet 3  . Probiotic Product (PROBIOTIC DAILY PO) Take by mouth. Ultraflora IB Probiotic    . promethazine (PHENERGAN) 25 MG tablet TAKE 1 TABLET BY MOUTH EVERY 6 HOURS AS NEEDED. 120 tablet 0  . spironolactone (ALDACTONE) 25 MG tablet TAKE 1 TABLET BY MOUTH 2 TIMES DAILY. 180 tablet 3  . tiZANidine (ZANAFLEX) 4 MG tablet Take 1 tablet (4 mg total) by mouth at bedtime. 90 tablet 0  . torsemide (DEMADEX) 20 MG tablet Take 1 tablet (20 mg total) by mouth 2 (two) times daily. 60 tablet 3  . UNABLE TO FIND CPAP: AT bedtime; setting is "12"    . Vitamin  D, Ergocalciferol, (DRISDOL) 1.25 MG (50000 UNIT) CAPS capsule TAKE 1 CAPSULE BY MOUTH EVERY 7 DAYS. 12 capsule 0   No facility-administered medications prior to visit.    PAST MEDICAL HISTORY: Past Medical History:  Diagnosis Date  . Arthritis    Rheumatoid arthritis,   . Back pain   . Blood transfusion    1981  . CHF (congestive heart failure) ([place])   . Chronic diastolic CHF (congestive heart failure) ([facility])    diastolic   . DDD (degenerative disc disease), cervical   . DDD (degenerative disc disease), lumbar   . Edema of both lower  extremities   . Fibromyalgia   . Gallbladder problem   . GERD (gastroesophageal reflux disease)   . Heart murmur    as a child  . Hiatal hernia    sjorgens syndrome  . High blood pressure   . High cholesterol   . IBS (irritable bowel syndrome)   . Joint pain   . Numbness and tingling of both feet   . Numbness of fingers   . OSA (obstructive sleep apnea)   . Osteoarthritis   . Peripheral autonomic neuropathy of unknown cause   . Pneumonia 07/2018  . Polyneuropathy   . Pre-diabetes   . PVC (premature ventricular contraction)   . Raynaud disease   . Rheumatoid arthritis ([place])   . Sjogren's disease ([place])   . Sleep apnea   . SOB (shortness of breath)   . Swallowing difficulty   . Urticaria   . Vitamin D deficiency     PAST SURGICAL HISTORY: Past Surgical History:  Procedure Laterality Date  . ABDOMINAL HYSTERECTOMY     BTL, BSO  . ADENOIDECTOMY    . BREAST SURGERY     mass removal   . CHOLECYSTECTOMY    . dental implants    . DIAGNOSTIC LAPAROSCOPY     x3  . KNEE ARTHROSCOPY     x2  . MASS EXCISION Left 03/22/2017   Procedure: EXCISION OF LEFT BREAST MASS;  Surgeon: Donnie Mesa, MD;  [facility]: [facility];  Service: General;  Laterality: Left;  . patotid cystectomy    . RIGHT/LEFT HEART CATH AND CORONARY ANGIOGRAPHY N/A 07/25/2019   Procedure: RIGHT/LEFT HEART CATH AND CORONARY ANGIOGRAPHY;  Surgeon: Jolaine Artist, MD;   Location: [facility];  Service: Cardiovascular;  Laterality: N/A;  . TENDON REPAIR  1980   left ankle and tibia  . TONSILLECTOMY    . TOTAL KNEE ARTHROPLASTY  06/21/2011   Procedure: TOTAL KNEE ARTHROPLASTY;  Surgeon: Gearlean Alf;  Location: [facility];  Service: Orthopedics;  Laterality: Right;  . TUBAL LIGATION      FAMILY HISTORY: Family History  Problem Relation Age of Onset  . Alzheimer's disease Mother   . Heart attack Mother   . Hypertension Mother   . Glaucoma Mother   . High Cholesterol Mother   . Heart disease Mother   . Depression Mother   . Cancer Father   . High Cholesterol Father   . Heart disease Father   . Sleep apnea Father   . Heart attack Brother   . Heart disease Brother   . Glaucoma Brother   . Hyperlipidemia Brother   . Glaucoma Brother   . Asthma Son   . Allergic rhinitis Neg Hx   . Angioedema Neg Hx   . Eczema Neg Hx   . Urticaria Neg Hx     SOCIAL HISTORY: Social History   Socioeconomic History  . Marital status: Widowed    Spouse name: Not on file  . Number of children: 2  . Years of education: AS  . Highest education level: Not on file  Occupational History  . Occupation: retired Marine scientist  . Occupation: Therapist, sports  Tobacco Use  . Smoking status: Never Smoker  . Smokeless tobacco: Never Used  Vaping Use  . Vaping Use: Never used  Substance and Sexual Activity  . Alcohol use: Yes    Comment: socially   . Drug use: No  . Sexual activity: Never  Other Topics Concern  . Not on file  Social History  Narrative   Lives at home alone   Right-handed   Drinks 1 or less cups of coffee and 2 servings of either tea or soda per day   Social Determinants of Health   Financial Resource Strain: Not on file  Food Insecurity: Not on file  Transportation Needs: Not on file  Physical Activity: Not on file  Stress: Not on file  Social Connections: Not on file  Intimate Partner Violence: Not on file      PHYSICAL EXAM  Vitals:   08/25/20  0954  BP: 124/84  Pulse: 74  Weight: 212 lb (96.2 kg)  Height: 5\' 1"  (1.549 m)   Body mass index is 40.06 kg/m.  Generalized: Well developed, in no acute distress   Neurological examination  Mentation: Alert oriented to time, place, history taking. Follows all commands speech and language fluent Cranial nerve II-XII: Pupils were equal round reactive to light. Extraocular movements were full, visual field were full on confrontational test. Facial sensation and strength were normal. Uvula tongue midline. Head turning and shoulder shrug  were normal and symmetric. Motor: The motor testing reveals 5 over 5 strength of all 4 extremities. Good symmetric motor tone is noted throughout.  Sensory: Sensory testing is intact to soft touch on all 4 extremities. No evidence of extinction is noted.  Coordination: Cerebellar testing reveals good finger-nose-finger and heel-to-shin bilaterally.  Gait and station: Gait is slightly wide-based. Reflexes: Deep tendon reflexes are symmetric and normal bilaterally.   DIAGNOSTIC DATA (LABS, IMAGING, TESTING) - I reviewed patient records, labs, notes, testing and imaging myself where available.  Lab Results  Component Value Date   WBC 7.7 05/11/2020   HGB 13.2 05/11/2020   HCT 40.0 05/11/2020   MCV 87 05/11/2020   PLT 181 05/11/2020      Component Value Date/Time   NA 140 07/28/2020 1340   K 4.4 07/28/2020 1340   CL 102 07/28/2020 1340   CO2 24 07/28/2020 1340   GLUCOSE 99 07/28/2020 1340   GLUCOSE 129 (H) 02/10/2020 1222   BUN 19 07/28/2020 1340   CREATININE 0.99 07/28/2020 1340   CREATININE 1.24 (H) 02/10/2020 1222   CALCIUM 9.7 07/28/2020 1340   PROT 6.5 07/28/2020 1340   ALBUMIN 4.1 07/28/2020 1340   AST 21 07/28/2020 1340   ALT 15 07/28/2020 1340   ALKPHOS 119 07/28/2020 1340   BILITOT 0.5 07/28/2020 1340   GFRNONAA 59 (L) 07/28/2020 1340   GFRNONAA 45 (L) 02/10/2020 1222   GFRAA 68 07/28/2020 1340   GFRAA 52 (L) 02/10/2020 1222    Lab Results  Component Value Date   CHOL 137 12/01/2019   HDL 44 12/01/2019   LDLCALC 58 12/01/2019   TRIG 213 (H) 12/01/2019   CHOLHDL 3.5 08/26/2019   Lab Results  Component Value Date   HGBA1C 5.8 (H) 05/11/2020   Lab Results  Component Value Date   VITAMINB12 491 07/02/2017   Lab Results  Component Value Date   TSH 1.550 12/01/2019      ASSESSMENT AND PLAN 70 y.o. year old female  has a past medical history of Arthritis, Back pain, Blood transfusion, CHF (congestive heart failure) ([facility]), Chronic diastolic CHF (congestive heart failure) ([place]), DDD (degenerative disc disease), cervical, DDD (degenerative disc disease), lumbar, Edema of both lower extremities, Fibromyalgia, Gallbladder problem, GERD (gastroesophageal reflux disease), Heart murmur, Hiatal hernia, High blood pressure, High cholesterol, IBS (irritable bowel syndrome), Joint pain, Numbness and tingling of both feet, Numbness of fingers, OSA (obstructive sleep apnea),  Osteoarthritis, Peripheral autonomic neuropathy of unknown cause, Pneumonia (07/2018), Polyneuropathy, Pre-diabetes, PVC (premature ventricular contraction), Raynaud disease, Rheumatoid arthritis ([place]), Sjogren's disease ([place]), Sleep apnea, SOB (shortness of breath), Swallowing difficulty, Urticaria, and Vitamin D deficiency. here with:  Peripheral neuropathy   Continue gabapentin-600 mg twice a day   advised if symptoms worsen or she develops new symptoms she should let us know  Follow-up in 1 year or sooner if needed   I spent 25 minutes of face-to-face and non-face-to-face time with patient.  This included previsit chart review, lab review, study review, order entry, electronic health record documentation, patient education.  Ward Givens, MSN, NP-C 08/25/2020, 10:03 AM [facility] [address], [place], [place] [address] [phone]

## 2020-09-01 ENCOUNTER — Other Ambulatory Visit: Payer: Self-pay | Admitting: Physician Assistant

## 2020-09-01 DIAGNOSIS — Z1231 Encounter for screening mammogram for malignant neoplasm of breast: Secondary | ICD-10-CM

## 2020-09-02 ENCOUNTER — Ambulatory Visit: Payer: PPO | Admitting: Rheumatology

## 2020-09-02 DIAGNOSIS — G8929 Other chronic pain: Secondary | ICD-10-CM

## 2020-09-02 DIAGNOSIS — M1712 Unilateral primary osteoarthritis, left knee: Secondary | ICD-10-CM

## 2020-09-02 DIAGNOSIS — M0579 Rheumatoid arthritis with rheumatoid factor of multiple sites without organ or systems involvement: Secondary | ICD-10-CM

## 2020-09-02 DIAGNOSIS — Z96651 Presence of right artificial knee joint: Secondary | ICD-10-CM

## 2020-09-02 DIAGNOSIS — I7300 Raynaud's syndrome without gangrene: Secondary | ICD-10-CM

## 2020-09-02 DIAGNOSIS — M810 Age-related osteoporosis without current pathological fracture: Secondary | ICD-10-CM

## 2020-09-02 DIAGNOSIS — M3501 Sicca syndrome with keratoconjunctivitis: Secondary | ICD-10-CM

## 2020-09-02 DIAGNOSIS — Z8669 Personal history of other diseases of the nervous system and sense organs: Secondary | ICD-10-CM

## 2020-09-02 DIAGNOSIS — Z8719 Personal history of other diseases of the digestive system: Secondary | ICD-10-CM

## 2020-09-02 DIAGNOSIS — Z79899 Other long term (current) drug therapy: Secondary | ICD-10-CM

## 2020-09-02 DIAGNOSIS — M5136 Other intervertebral disc degeneration, lumbar region: Secondary | ICD-10-CM

## 2020-09-02 DIAGNOSIS — K200 Eosinophilic esophagitis: Secondary | ICD-10-CM

## 2020-09-02 DIAGNOSIS — Z8679 Personal history of other diseases of the circulatory system: Secondary | ICD-10-CM

## 2020-09-02 DIAGNOSIS — M797 Fibromyalgia: Secondary | ICD-10-CM

## 2020-09-02 DIAGNOSIS — Z8659 Personal history of other mental and behavioral disorders: Secondary | ICD-10-CM

## 2020-09-02 DIAGNOSIS — Z8639 Personal history of other endocrine, nutritional and metabolic disease: Secondary | ICD-10-CM

## 2020-09-08 ENCOUNTER — Ambulatory Visit (INDEPENDENT_AMBULATORY_CARE_PROVIDER_SITE_OTHER): Payer: PPO | Admitting: Psychologist

## 2020-09-08 DIAGNOSIS — F320 Major depressive disorder, single episode, mild: Secondary | ICD-10-CM | POA: Diagnosis not present

## 2020-09-09 ENCOUNTER — Encounter (INDEPENDENT_AMBULATORY_CARE_PROVIDER_SITE_OTHER): Payer: Self-pay | Admitting: Family Medicine

## 2020-09-09 ENCOUNTER — Telehealth (INDEPENDENT_AMBULATORY_CARE_PROVIDER_SITE_OTHER): Payer: PPO | Admitting: Family Medicine

## 2020-09-09 DIAGNOSIS — E559 Vitamin D deficiency, unspecified: Secondary | ICD-10-CM | POA: Diagnosis not present

## 2020-09-09 DIAGNOSIS — Z6839 Body mass index (BMI) 39.0-39.9, adult: Secondary | ICD-10-CM

## 2020-09-09 DIAGNOSIS — G4709 Other insomnia: Secondary | ICD-10-CM | POA: Diagnosis not present

## 2020-09-09 MED ORDER — VITAMIN D (ERGOCALCIFEROL) 1.25 MG (50000 UNIT) PO CAPS: 50000.0000 [IU] | ORAL_CAPSULE | ORAL | 0 refills | Status: DC

## 2020-09-09 MED ORDER — DOXYLAMINE SUCCINATE (SLEEP) 25 MG PO TABS: 25.0000 mg | ORAL_TABLET | Freq: Every evening | ORAL | 0 refills | Status: DC | PRN

## 2020-09-10 NOTE — Progress Notes (Signed)
Office Visit Note  Patient: [name]             Date of Birth: 1951/02/06           MRN: [id number]             PCP: Lorrene Reid, PA-C Referring: Lorrene Reid, PA-C Visit Date: 09/23/2020 Occupation: @GUAROCC @  Subjective:  Back pain.   History of Present Illness: [name] is a 70 y.o. female with history of rheumatoid arthritis, osteoarthritis, Sjogren's syndrome and degenerative disc disease.  She states she continues to have pain and discomfort in her lower back.  She denies any joint swelling.  She has been tolerating Arava and Plaquenil well.  She continues to have sicca symptoms.  She states overall her joints are better than before.  Activities of Daily Living:  Patient reports morning stiffness for 5-10 minutes.   Patient Reports nocturnal pain.  Difficulty dressing/grooming: Denies Difficulty climbing stairs: Reports Difficulty getting out of chair: Reports Difficulty using hands for taps, buttons, cutlery, and/or writing: Reports  Review of Systems  Constitutional: Positive for fatigue.  HENT: Positive for mouth dryness. Negative for mouth sores and nose dryness.   Eyes: Positive for dryness. Negative for pain, itching and visual disturbance.  Respiratory: Negative for cough, hemoptysis, shortness of breath and difficulty breathing.   Cardiovascular: Positive for swelling in legs/feet. Negative for chest pain and palpitations.  Gastrointestinal: Positive for diarrhea. Negative for abdominal pain, blood in stool and constipation.  Endocrine: Negative for increased urination.  Genitourinary: Negative for painful urination.  Musculoskeletal: Positive for arthralgias, joint pain, myalgias, morning stiffness and myalgias. Negative for joint swelling, muscle weakness and muscle tenderness.  Skin: Negative for color change, rash and redness.  Allergic/Immunologic: Negative for susceptible to infections.  Neurological: Negative for dizziness, numbness,  headaches, memory loss and weakness.  Hematological: Positive for swollen glands.  Psychiatric/Behavioral: Negative for confusion and sleep disturbance.    PMFS History:  Patient Active Problem List   Diagnosis Date Noted  . Renal insufficiency 08/01/2020  . Muscle spasm of back 08/01/2020  . Microalbuminuria 08/01/2020  . Post-menopausal 09/02/2019  . Pain in left knee 08/23/2018  . Lumbar pain 07/04/2018  . Recurrent urticaria 05/20/2018  . Rhinitis 05/20/2018  . Vitamin D deficiency 11/20/2017  . Mixed hyperlipidemia 11/20/2017  . Family history of coronary arteriosclerosis- strong fam h/o CAD and early CAD.  07/17/2017  . Raynaud's disease without gangrene 03/12/2017  . Abnormal weight gain 02/06/2017  . Shingles outbreak 02/06/2017  . Cyst (solitary) of breast, left 01/31/2017  . Eosinophilic esophagitis 78/29/5621  . History of hyperlipidemia 10/04/2016  . 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 History of diverticulitis 09/03/2016  . Osteoporosis 09/03/2016  . Autoimmune disease ([place]) 09/02/2016  . High risk medication use 09/02/2016  . History of esophagitis 09/02/2016  . Elevated triglycerides with high cholesterol 08/23/2016  . Low serum HDL 08/23/2016  . Breast cyst, left 06/14/2016  . Encounter for wellness examination 05/23/2016  . Abnormality of gait 05/09/2016  . Sjoegren syndrome 02/29/2016  . GERD (gastroesophageal reflux disease) 01/19/2016  . Glucose intolerance (impaired glucose tolerance) 01/19/2016  . Chronic diastolic CHF (congestive heart failure) ([place]) 04/20/2015  . Peripheral polyneuropathy 02/02/2014  . Morbid obesity ([place]) 11/17/2013  . Heart murmur   . OSA (obstructive sleep apnea)   . Rheumatoid arthritis ([place])   . Hiatal hernia   . Fibromyalgia   . PVC's (premature ventricular contractions)   . History of  total knee replacement, right 06/24/2011  . Unilateral primary osteoarthritis, left knee 06/22/2011    Past Medical History:   Diagnosis Date  . Arthritis    Rheumatoid arthritis,   . Back pain   . Blood transfusion    1981  . CHF (congestive heart failure) ([place])   . Chronic diastolic CHF (congestive heart failure) (HCC)    diastolic   . DDD (degenerative disc disease), cervical   . DDD (degenerative disc disease), lumbar   . Edema of both lower extremities   . Fibromyalgia   . Gallbladder problem   . GERD (gastroesophageal reflux disease)   . Heart murmur    as a child  . Hiatal hernia    sjorgens syndrome  . High blood pressure   . High cholesterol   . IBS (irritable bowel syndrome)   . Joint pain   . Numbness and tingling of both feet   . Numbness of fingers   . OSA (obstructive sleep apnea)   . Osteoarthritis   . Peripheral autonomic neuropathy of unknown cause   . Pneumonia 07/2018  . Polyneuropathy   . Pre-diabetes   . PVC (premature ventricular contraction)   . Raynaud disease   . Rheumatoid arthritis ([place])   . Sjogren's disease ([place])   . Sleep apnea   . SOB (shortness of breath)   . Swallowing difficulty   . Urticaria   . Vitamin D deficiency     Family History  Problem Relation Age of Onset  . Alzheimer's disease Mother   . Heart attack Mother   . Hypertension Mother   . Glaucoma Mother   . High Cholesterol Mother   . Heart disease Mother   . Depression Mother   . Cancer Father   . High Cholesterol Father   . Heart disease Father   . Sleep apnea Father   . Heart attack Brother   . Heart disease Brother   . Glaucoma Brother   . Hyperlipidemia Brother   . Glaucoma Brother   . Asthma Son   . Allergic rhinitis Neg Hx   . Angioedema Neg Hx   . Eczema Neg Hx   . Urticaria Neg Hx    Past Surgical History:  Procedure Laterality Date  . ABDOMINAL HYSTERECTOMY     BTL, BSO  . ADENOIDECTOMY    . BREAST SURGERY     mass removal   . CHOLECYSTECTOMY    . dental implants    . DIAGNOSTIC LAPAROSCOPY     x3  . KNEE ARTHROSCOPY     x2  . MASS EXCISION Left 03/22/2017    Procedure: EXCISION OF LEFT BREAST MASS;  Surgeon: Donnie Mesa, MD;  Location: [facility];  Service: General;  Laterality: Left;  . patotid cystectomy    . RIGHT/LEFT HEART CATH AND CORONARY ANGIOGRAPHY N/A 07/25/2019   Procedure: RIGHT/LEFT HEART CATH AND CORONARY ANGIOGRAPHY;  Surgeon: Jolaine Artist, MD;  Location: [facility];  Service: Cardiovascular;  Laterality: N/A;  . TENDON REPAIR  1980   left ankle and tibia  . TONSILLECTOMY    . TOTAL KNEE ARTHROPLASTY  06/21/2011   Procedure: TOTAL KNEE ARTHROPLASTY;  Surgeon: Gearlean Alf;  Location: [facility];  Service: Orthopedics;  Laterality: Right;  . TUBAL LIGATION     Social History   Social History Narrative   Lives at home alone   Right-handed   Drinks 1 or less cups of coffee and 2 servings of either tea or soda  per day   Immunization History  Administered Date(s) Administered  . Influenza, High Dose Seasonal PF 06/12/2018, 05/17/2019  . Influenza,inj,Quad PF,6+ Mos 05/23/2016, 06/01/2017  . Influenza-Unspecified 06/05/2018, 05/17/2019, 06/07/2020  . PFIZER(Purple Top)SARS-COV-2 Vaccination 09/03/2019, 09/24/2019, 04/01/2020  . Pneumococcal Conjugate-13 07/02/2017  . Pneumococcal Polysaccharide-23 05/17/2005  . Td 10/16/2007  . Tdap 03/21/2018  . Zoster Recombinat (Shingrix) 06/10/2019, 12/11/2019     Objective: Vital Signs: BP 113/63 (BP Location: Left Arm, Patient Position: Sitting, Cuff Size: Normal)   Pulse 75   Ht 5\' 1"  (1.549 m)   Wt 211 lb (95.7 kg)   BMI 39.87 kg/m    Physical Exam Vitals and nursing note reviewed.  Constitutional:      Appearance: She is well-developed and well-nourished.  HENT:     Head: Normocephalic and atraumatic.     Comments: Right parotid swelling and cervical lymphadenopathy was noted. Eyes:     Extraocular Movements: EOM normal.     Conjunctiva/sclera: Conjunctivae normal.  Cardiovascular:     Rate and Rhythm: Normal rate and regular rhythm.     Pulses: Intact  distal pulses.     Heart sounds: Normal heart sounds.  Pulmonary:     Effort: Pulmonary effort is normal.     Breath sounds: Normal breath sounds.  Abdominal:     General: Bowel sounds are normal.     Palpations: Abdomen is soft.  Musculoskeletal:     Cervical back: Normal range of motion.  Lymphadenopathy:     Cervical: No cervical adenopathy.  Skin:    General: Skin is warm and dry.     Capillary Refill: Capillary refill takes less than 2 seconds.  Neurological:     Mental Status: She is alert and oriented to person, place, and time.  Psychiatric:        Mood and Affect: Mood and affect normal.        Behavior: Behavior normal.      Musculoskeletal Exam: C-spine was in good range of motion.  She has thoracic and lumbar kyphosis and scoliosis.  Shoulder joints, elbow joints, wrist joints, MCPs PIPs and DIPs with good range of motion with no synovitis.  Hip joints and knee joints with good range of motion.  She had no tenderness over ankles or MTPs.  CDAI Exam: CDAI Score: 0.6  Patient Global: 3 mm; Provider Global: 3 mm Swollen: 0 ; Tender: 0  Joint Exam 09/23/2020   No joint exam has been documented for this visit   There is currently no information documented on the homunculus. Go to the Rheumatology activity and complete the homunculus joint exam.  Investigation: No additional findings.  Imaging: No results found.  Recent Labs: Lab Results  Component Value Date   WBC 7.7 05/11/2020   HGB 13.2 05/11/2020   PLT 181 05/11/2020   NA 140 07/28/2020   K 4.4 07/28/2020   CL 102 07/28/2020   CO2 24 07/28/2020   GLUCOSE 99 07/28/2020   BUN 19 07/28/2020   CREATININE 0.99 07/28/2020   BILITOT 0.5 07/28/2020   ALKPHOS 119 07/28/2020   AST 21 07/28/2020   ALT 15 07/28/2020   PROT 6.5 07/28/2020   ALBUMIN 4.1 07/28/2020   CALCIUM 9.7 07/28/2020   GFRAA 68 07/28/2020    Speciality Comments: PLQ Eye Exam: 01/14/2020 [facility] follow up in 1  year  Procedures:  No procedures performed Allergies: Sulfa antibiotics, Cymbalta [duloxetine hcl], Demerol, Ivp dye [iodinated diagnostic agents], Morphine and related, Sulfasalazine, and Adhesive [  tape]   Assessment / Plan:     Visit Diagnoses: Rheumatoid arthritis involving multiple sites with positive rheumatoid factor (HCC) - Positive RF, positive ANA.  She had no synovitis on my examination.  High risk medication use - Arava 20 mg daily and Plaquenil 200 mg 1 tablet by mouth twice daily Monday through Friday. D/c IV orencia due to cost. PLQ Eye Exam: 01/14/2020.  She will get eye examination in June 2022.  She has not been compliant with the labs.  I discussed obtaining labs every 3 months are important to monitor for drug toxicity.  She had labs today at her PCPs office.  We will refill prescriptions once labs are available.  Sjogren's syndrome with keratoconjunctivitis sicca (HCC)-she continues to have sicca symptoms.  She has some right parotid enlargement and cervical lymphadenopathy.  Have advised her to schedule an appointment with her ENT for evaluation.  She has chronic cervical lymphadenopathy.  Increased risk of lymphoma with Sjogren's was also discussed.  Raynaud's disease without gangrene-currently not active.  Unilateral primary osteoarthritis, left knee-chronic pain off and on.  History of total knee replacement, right-doing well.  Chronic SI joint pain-currently not symptomatic.  DDD (degenerative disc disease), lumbar - X-ray showed severe levoscoliosis and multilevel kissing osteophytes.  She continues to have lower back pain and discomfort.  She wants to try TENS unit.  A prescription for TENS unit was given.  Fibromyalgia -she has generalized pain and discomfort from fibromyalgia.  She uses methocarbamol and Zanaflex on as needed basis.  Per her request a prescription for methocarbamol was given today.  Zanaflex 4 mg by mouth at bedtime as needed.  Side effects of muscle  relaxers were discussed.  She was advised not to drive or use any machinery while she is taking muscle relaxer.  Increased risk of falling was also discussed.  Age-related osteoporosis without current pathological fracture - The BMD measured at [facility] Right is 0.782 g/cm2 with a T-scoreof -1.8. 08/2019.  Other medical problems are listed as follows:  Eosinophilic esophagitis  History of hyperlipidemia  History of peripheral neuropathy  History of cardiac murmur  History of depression  History of diabetes mellitus  History of diverticulitis  Orders: No orders of the defined types were placed in this encounter.  Meds ordered this encounter  Medications  . methocarbamol (ROBAXIN) 500 MG tablet    Sig: TAKE 1 TABLET BY MOUTH DAILY AS NEEDED FOR MUSCLE SPASMS.    Dispense:  30 tablet    Refill:  0      Follow-Up Instructions: Return in about 5 months (around 02/20/2021) for Rheumatoid arthritis, Sjogren's.   Bo Merino, MD  Note - This record has been created using Editor, commissioning.  Chart creation errors have been sought, but may not always  have been located. Such creation errors do not reflect on  the standard of medical care.

## 2020-09-13 NOTE — Progress Notes (Signed)
TeleHealth Visit:  Due to the COVID-19 pandemic, this visit was completed with telemedicine (audio/video) technology to reduce patient and provider exposure as well as to preserve personal protective equipment.   [name] has verbally consented to this TeleHealth visit. The patient is located at home, the provider is located at the [facility] office. The participants in this visit include the listed provider and patient. The visit was conducted today via MyChart Video.   Chief Complaint: OBESITY [name] is here to discuss her progress with her obesity treatment plan along with follow-up of her obesity related diagnoses. [name] is on the Category 3 Plan and states she is following her eating plan approximately 75-80% of the time. [name] states she is walking in house.  Today's visit was #: 15 Starting weight: 239 lbs Starting date: 12/01/2019  Interim History: [name] has been sick on and off for almost 2 weeks. Feeling slightly better today than previous few days. Only times she is off meal plan is when she isn't eating enough. [name] will do protein shakes to add to quantity of food. [name] really only wants 1 meal daily, 1 snack and a protein shake. [name] may be interested in weight loss medication.  Subjective:   1. Other insomnia  [name] is having trouble sleeping almost nightly. She is not taking medications.  2. Vitamin D deficiency  [name] is on prescription Vitamin D. She denies nausea, vomiting, or muscle weakness, but notes fatigue.   Assessment/Plan:   1. Other insomnia The problem of recurrent insomnia was discussed. Orders and follow up as documented in patient record. Counseling: Intensive lifestyle modifications are the first line treatment for this issue. We discussed several lifestyle modifications today and she will continue to work on diet, exercise and weight loss efforts. Sleep hygiene discussed. Unisom (doxylamine) 25 mg po qhs #30 no refill.  Counseling  Limit or  avoid alcohol, caffeinated beverages, and cigarettes, especially close to bedtime.   Do not eat a large meal or eat spicy foods right before bedtime. This can lead to digestive discomfort that can make it hard for you to sleep.  Keep a sleep diary to help you and your health care provider figure out what could be causing your insomnia.  . Make your bedroom a dark, comfortable place where it is easy to fall asleep. ? Put up shades or blackout curtains to block light from outside. ? Use a white noise machine to block noise. ? Keep the temperature cool. . Limit screen use before bedtime. This includes: ? Watching TV. ? Using your smartphone, tablet, or computer. . Stick to a routine that includes going to bed and waking up at the same times every day and night. This can help you fall asleep faster. Consider making a quiet activity, such as reading, part of your nighttime routine. . Try to avoid taking naps during the day so that you sleep better at night. . Get out of bed if you are still awake after 15 minutes of trying to sleep. Keep the lights down, but try reading or doing a quiet activity. When you feel sleepy, go back to bed.   - doxylamine, Sleep, (UNISOM) 25 MG tablet; Take 1 tablet (25 mg total) by mouth at bedtime as needed.  Dispense: 30 tablet; Refill: 0  2. Vitamin D deficiency Low Vitamin D level contributes to fatigue and are associated with obesity, breast, and colon cancer. She agrees to continue to take prescription Vitamin D @50 ,000 IU every  week and will follow-up for routine testing of Vitamin D, at least 2-3 times per year to avoid over-replacement. We will refill prescription Vitamin D 50,000 unit weekly #4 no refill.  - Vitamin D, Ergocalciferol, (DRISDOL) 1.25 MG (50000 UNIT) CAPS capsule; Take 1 capsule (50,000 Units total) by mouth every 7 (seven) days.  Dispense: 12 capsule; Refill: 0  3. Class 2 severe obesity with serious comorbidity and body mass index (BMI) of  39.0 to 39.9 in adult, unspecified obesity type (HCC)  [name] is currently in the action stage of change. As such, her goal is to continue with weight loss efforts. She has agreed to the Category 3 Plan.   Exercise goals: No exercise has been prescribed at this time.  Behavioral modification strategies: increasing lean protein intake, no skipping meals, meal planning and cooking strategies and keeping healthy foods in the home.  [name] has agreed to follow-up with our clinic in 2 weeks. She was informed of the importance of frequent follow-up visits to maximize her success with intensive lifestyle modifications for her multiple health conditions.     Objective:   VITALS: Per patient if applicable, see vitals. GENERAL: Alert and in no acute distress. CARDIOPULMONARY: No increased WOB. Speaking in clear sentences.  PSYCH: Pleasant and cooperative. Speech normal rate and rhythm. Affect is appropriate. Insight and judgement are appropriate. Attention is focused, linear, and appropriate.  NEURO: Oriented as arrived to appointment on time with no prompting.   Lab Results  Component Value Date   CREATININE 0.99 07/28/2020   BUN 19 07/28/2020   NA 140 07/28/2020   K 4.4 07/28/2020   CL 102 07/28/2020   CO2 24 07/28/2020   Lab Results  Component Value Date   ALT 15 07/28/2020   AST 21 07/28/2020   ALKPHOS 119 07/28/2020   BILITOT 0.5 07/28/2020   Lab Results  Component Value Date   HGBA1C 5.8 (H) 05/11/2020   HGBA1C 6.2 (H) 12/01/2019   HGBA1C 6.1 (H) 08/26/2019   HGBA1C 5.6 08/20/2018   HGBA1C 5.4 03/21/2018   Lab Results  Component Value Date   INSULIN 21.0 06/01/2020   INSULIN 32.3 (H) 12/01/2019   Lab Results  Component Value Date   TSH 1.550 12/01/2019   Lab Results  Component Value Date   CHOL 137 12/01/2019   HDL 44 12/01/2019   LDLCALC 58 12/01/2019   TRIG 213 (H) 12/01/2019   CHOLHDL 3.5 08/26/2019   Lab Results  Component Value Date   WBC 7.7 05/11/2020    HGB 13.2 05/11/2020   HCT 40.0 05/11/2020   MCV 87 05/11/2020   PLT 181 05/11/2020   No results found for: IRON, TIBC, FERRITIN  Attestation Statements:   Reviewed by clinician on day of visit: allergies, medications, problem list, medical history, surgical history, family history, social history, and previous encounter notes.    I, Para March, am acting as transcriptionist for Coralie Common, MD.  I have reviewed the above documentation for accuracy and completeness, and I agree with the above. - Jinny Blossom, MD

## 2020-09-21 ENCOUNTER — Ambulatory Visit (INDEPENDENT_AMBULATORY_CARE_PROVIDER_SITE_OTHER): Payer: PPO | Admitting: Psychologist

## 2020-09-21 DIAGNOSIS — F320 Major depressive disorder, single episode, mild: Secondary | ICD-10-CM | POA: Diagnosis not present

## 2020-09-23 ENCOUNTER — Ambulatory Visit (INDEPENDENT_AMBULATORY_CARE_PROVIDER_SITE_OTHER): Payer: PPO | Admitting: Family Medicine

## 2020-09-23 ENCOUNTER — Other Ambulatory Visit: Payer: Self-pay

## 2020-09-23 ENCOUNTER — Encounter (INDEPENDENT_AMBULATORY_CARE_PROVIDER_SITE_OTHER): Payer: Self-pay | Admitting: Family Medicine

## 2020-09-23 ENCOUNTER — Other Ambulatory Visit: Payer: Self-pay | Admitting: Rheumatology

## 2020-09-23 ENCOUNTER — Ambulatory Visit: Payer: PPO | Admitting: Rheumatology

## 2020-09-23 ENCOUNTER — Other Ambulatory Visit (HOSPITAL_COMMUNITY): Payer: Self-pay | Admitting: Cardiology

## 2020-09-23 ENCOUNTER — Encounter: Payer: Self-pay | Admitting: Rheumatology

## 2020-09-23 ENCOUNTER — Other Ambulatory Visit (INDEPENDENT_AMBULATORY_CARE_PROVIDER_SITE_OTHER): Payer: Self-pay | Admitting: Family Medicine

## 2020-09-23 ENCOUNTER — Telehealth: Payer: Self-pay | Admitting: Radiology

## 2020-09-23 VITALS — BP 110/82 | HR 67 | Temp 98.5°F | Ht 61.0 in | Wt 206.0 lb

## 2020-09-23 VITALS — BP 113/63 | HR 75 | Ht 61.0 in | Wt 211.0 lb

## 2020-09-23 DIAGNOSIS — K200 Eosinophilic esophagitis: Secondary | ICD-10-CM

## 2020-09-23 DIAGNOSIS — Z8639 Personal history of other endocrine, nutritional and metabolic disease: Secondary | ICD-10-CM

## 2020-09-23 DIAGNOSIS — M810 Age-related osteoporosis without current pathological fracture: Secondary | ICD-10-CM

## 2020-09-23 DIAGNOSIS — M5136 Other intervertebral disc degeneration, lumbar region: Secondary | ICD-10-CM

## 2020-09-23 DIAGNOSIS — M3501 Sicca syndrome with keratoconjunctivitis: Secondary | ICD-10-CM

## 2020-09-23 DIAGNOSIS — Z6839 Body mass index (BMI) 39.0-39.9, adult: Secondary | ICD-10-CM

## 2020-09-23 DIAGNOSIS — M797 Fibromyalgia: Secondary | ICD-10-CM

## 2020-09-23 DIAGNOSIS — Z8719 Personal history of other diseases of the digestive system: Secondary | ICD-10-CM

## 2020-09-23 DIAGNOSIS — Z8659 Personal history of other mental and behavioral disorders: Secondary | ICD-10-CM

## 2020-09-23 DIAGNOSIS — Z79899 Other long term (current) drug therapy: Secondary | ICD-10-CM | POA: Diagnosis not present

## 2020-09-23 DIAGNOSIS — Z96651 Presence of right artificial knee joint: Secondary | ICD-10-CM

## 2020-09-23 DIAGNOSIS — M0579 Rheumatoid arthritis with rheumatoid factor of multiple sites without organ or systems involvement: Secondary | ICD-10-CM

## 2020-09-23 DIAGNOSIS — R739 Hyperglycemia, unspecified: Secondary | ICD-10-CM | POA: Diagnosis not present

## 2020-09-23 DIAGNOSIS — Z8669 Personal history of other diseases of the nervous system and sense organs: Secondary | ICD-10-CM

## 2020-09-23 DIAGNOSIS — R632 Polyphagia: Secondary | ICD-10-CM | POA: Diagnosis not present

## 2020-09-23 DIAGNOSIS — M533 Sacrococcygeal disorders, not elsewhere classified: Secondary | ICD-10-CM | POA: Diagnosis not present

## 2020-09-23 DIAGNOSIS — M1712 Unilateral primary osteoarthritis, left knee: Secondary | ICD-10-CM

## 2020-09-23 DIAGNOSIS — G8929 Other chronic pain: Secondary | ICD-10-CM

## 2020-09-23 DIAGNOSIS — E782 Mixed hyperlipidemia: Secondary | ICD-10-CM | POA: Diagnosis not present

## 2020-09-23 DIAGNOSIS — E559 Vitamin D deficiency, unspecified: Secondary | ICD-10-CM | POA: Diagnosis not present

## 2020-09-23 DIAGNOSIS — I7300 Raynaud's syndrome without gangrene: Secondary | ICD-10-CM | POA: Diagnosis not present

## 2020-09-23 DIAGNOSIS — Z8679 Personal history of other diseases of the circulatory system: Secondary | ICD-10-CM

## 2020-09-23 MED ORDER — METHOCARBAMOL 500 MG PO TABS
ORAL_TABLET | ORAL | 0 refills | Status: DC
Start: 2020-09-23 — End: 2021-05-10

## 2020-09-23 NOTE — Patient Instructions (Addendum)
Standing Labs We placed an order today for your standing lab work.   Please have your standing labs drawn in May and every 3 months  If possible, please have your labs drawn 2 weeks prior to your appointment so that the provider can discuss your results at your appointment.  We have open lab daily Monday through Thursday from 1:30-4:30 PM and Friday from 1:30-4:00 PM at the office of Dr. Bo Merino, [facility].   Please be advised, all patients with office appointments requiring lab work will take precedents over walk-in lab work.  If possible, please come for your lab work on Monday and Friday afternoons, as you may experience shorter wait times. The office is located at [address] [place], [place], [place] [address] No appointment is necessary.   Labs are drawn by [facility]. Please bring your co-pay at the time of your lab draw.  You may receive a bill from [facility] for your lab work.  If you wish to have your labs drawn at another location, please call the office 24 hours in advance to send orders.  If you have any questions regarding directions or hours of operation,  please call [phone].   As a reminder, please drink plenty of water prior to coming for your lab work. Thanks!   COVID-19 vaccine recommendations:   COVID-19 vaccine is recommended for everyone (unless you are allergic to a vaccine component), even if you are on a medication that suppresses your immune system.   It is recommended that  immunocompromised individuals should get Covid-19 vaccine 3 doses 1 month apart and then fourth dose (booster) 6 months after the third dose.  Do not take Tylenol or any anti-inflammatory medications (NSAIDs) 24 hours prior to the COVID-19 vaccination.   There is no direct evidence about the efficacy of the COVID-19 vaccine in individuals who are on medications that suppress the immune system.   Even if you are fully vaccinated, and you are on any medications  that suppress your immune system, please continue to wear a mask, maintain at least six feet social distance and practice hand hygiene.   If you develop a COVID-19 infection, please contact your PCP or our office to determine if you need monoclonal antibody infusion.  The booster vaccine is now available for immunocompromised patients.   Please see the following web sites for updated information.   [email or web address]   Please schedule an appointment with your ENT surgeon for cervical lymph node enlargement and parotid enlargement.

## 2020-09-23 NOTE — Telephone Encounter (Signed)
Last Visit: 09/23/2020 Next Visit: 02/24/2021  Current Dose per office note on 09/23/2020, not discussed Dx:  Rheumatoid arthritis involving multiple sites with positive rheumatoid factor   Last Fill: 06/16/2020  Okay to refill Phenergan?

## 2020-09-23 NOTE — Telephone Encounter (Signed)
Spoke with Marcille Blanco with Dr. Avie Arenas office. Since patient had labs drawn at their office today they are willing to add on CMP w/ GFR and CBC w/ Diff/Plat.

## 2020-09-23 NOTE — Telephone Encounter (Signed)
Noted. Dr. Estanislado Pandy is also aware labs were added to labs drawn at Dr. Avie Arenas office.

## 2020-09-24 LAB — COMPREHENSIVE METABOLIC PANEL
ALT: 14 IU/L (ref 0–32)
AST: 20 IU/L (ref 0–40)
Albumin/Globulin Ratio: 1.5 (ref 1.2–2.2)
Albumin: 3.7 g/dL — ABNORMAL LOW (ref 3.8–4.8)
Alkaline Phosphatase: 111 IU/L (ref 44–121)
BUN/Creatinine Ratio: 15 (ref 12–28)
BUN: 17 mg/dL (ref 8–27)
Bilirubin Total: 0.4 mg/dL (ref 0.0–1.2)
CO2: 20 mmol/L (ref 20–29)
Calcium: 9 mg/dL (ref 8.7–10.3)
Chloride: 108 mmol/L — ABNORMAL HIGH (ref 96–106)
Creatinine, Ser: 1.17 mg/dL — ABNORMAL HIGH (ref 0.57–1.00)
GFR calc Af Amer: 55 mL/min/{1.73_m2} — ABNORMAL LOW (ref >59)
GFR calc non Af Amer: 48 mL/min/{1.73_m2} — ABNORMAL LOW (ref >59)
Globulin, Total: 2.4 g/dL (ref 1.5–4.5)
Glucose: 95 mg/dL (ref 65–99)
Potassium: 4.6 mmol/L (ref 3.5–5.2)
Sodium: 143 mmol/L (ref 134–144)
Total Protein: 6.1 g/dL (ref 6.0–8.5)

## 2020-09-24 LAB — LIPID PANEL WITH LDL/HDL RATIO
Cholesterol, Total: 108 mg/dL (ref 100–199)
HDL: 36 mg/dL — ABNORMAL LOW (ref >39)
LDL Chol Calc (NIH): 39 mg/dL (ref 0–99)
LDL/HDL Ratio: 1.1 ratio (ref 0.0–3.2)
Triglycerides: 210 mg/dL — ABNORMAL HIGH (ref 0–149)
VLDL Cholesterol Cal: 33 mg/dL (ref 5–40)

## 2020-09-24 LAB — GLOM FILT RATE, ESTIMATED
Creatinine, Ser: 1.13 mg/dL — ABNORMAL HIGH (ref 0.57–1.00)
GFR calc Af Amer: 58 mL/min/{1.73_m2} — ABNORMAL LOW (ref >59)
GFR calc non Af Amer: 50 mL/min/{1.73_m2} — ABNORMAL LOW (ref >59)

## 2020-09-24 LAB — CBC WITH DIFFERENTIAL/PLATELET
Basophils Absolute: 0.1 10*3/uL (ref 0.0–0.2)
Basos: 1 %
EOS (ABSOLUTE): 0.1 10*3/uL (ref 0.0–0.4)
Eos: 2 %
Hematocrit: 35.6 % (ref 34.0–46.6)
Hemoglobin: 11.7 g/dL (ref 11.1–15.9)
Immature Grans (Abs): 0.1 10*3/uL (ref 0.0–0.1)
Immature Granulocytes: 1 %
Lymphocytes Absolute: 1.1 10*3/uL (ref 0.7–3.1)
Lymphs: 20 %
MCH: 28.1 pg (ref 26.6–33.0)
MCHC: 32.9 g/dL (ref 31.5–35.7)
MCV: 85 fL (ref 79–97)
Monocytes Absolute: 0.6 10*3/uL (ref 0.1–0.9)
Monocytes: 11 %
Neutrophils Absolute: 3.7 10*3/uL (ref 1.4–7.0)
Neutrophils: 65 %
Platelets: 206 10*3/uL (ref 150–450)
RBC: 4.17 x10E6/uL (ref 3.77–5.28)
RDW: 14 % (ref 11.7–15.4)
WBC: 5.7 10*3/uL (ref 3.4–10.8)

## 2020-09-24 LAB — VITAMIN D 25 HYDROXY (VIT D DEFICIENCY, FRACTURES): Vit D, 25-Hydroxy: 92.2 ng/mL (ref 30.0–100.0)

## 2020-09-24 LAB — INSULIN, RANDOM: INSULIN: 25.4 u[IU]/mL — ABNORMAL HIGH (ref 2.6–24.9)

## 2020-09-24 LAB — HEMOGLOBIN A1C
Est. average glucose Bld gHb Est-mCnc: 111 mg/dL
Hgb A1c MFr Bld: 5.5 % (ref 4.8–5.6)

## 2020-09-27 NOTE — Progress Notes (Signed)
Chief Complaint:   OBESITY [name] is here to discuss her progress with her obesity treatment plan along with follow-up of her obesity related diagnoses. [name] is on the Category 3 Plan and states she is following her eating plan approximately 85-90% of the time. [name] states she is doing 0 minutes 0 times per week.  Today's visit was #: 76 Starting weight: 239 lbs Starting date: 12/01/2019 Today's weight: 206 lbs Today's date: 09/23/2020 Total lbs lost to date: 33 lbs Total lbs lost since last in-office visit: 2 lbs  Interim History: Pt is finally feeling better with eating more but still working on getting all the food in. She has gotten some Outshine fruit popsicles. She denies cravings. She is interested in trying more assertive medication measures.  Subjective:   1. Polyphagia Pt reports a significant drive to eat carbs. She cannot tolerate Metformin due to GI upset concerns (already struggling with diarrhea due to IBS).   2. Mixed hyperlipidemia Pt's last LDL 58, HDL 44, and triglycerides 213. She is on statin therapy with no myalgias and transaminitis.   3. Hyperglycemia Pt's last A1c 5.8 and insulin level 21.0. She is not on medication.  4. Rheumatoid arthritis involving multiple sites with positive rheumatoid factor ([name]) Pt sees Rheumatology. She is on Plaquenil.  5. Vitamin D deficiency Pt denies nausea, vomiting, and muscle weakness but notes fatigue. Pt is on prescription Vit D.  Assessment/Plan:   1. Polyphagia Start Ozempic 0.25 mg SubQ weekly.  2. Mixed hyperlipidemia Cardiovascular risk and specific lipid/LDL goals reviewed.  We discussed several lifestyle modifications today and [name] will continue to work on diet, exercise and weight loss efforts. Orders and follow up as documented in patient record. Repeat labs today.  Counseling Intensive lifestyle modifications are the first line treatment for this issue. . Dietary changes: Increase soluble fiber.  Decrease simple carbohydrates. . Exercise changes: Moderate to vigorous-intensity aerobic activity 150 minutes per week if tolerated. . Lipid-lowering medications: see documented in medical record.  - Comprehensive metabolic panel - Lipid Panel With LDL/HDL Ratio  3. Hyperglycemia Fasting labs will be obtained and results with be discussed with [name] in 2 weeks at her follow up visit. In the meanwhile [name] was started on a lower simple carbohydrate diet and will work on weight loss efforts. Check labs today.  - Hemoglobin A1c - Insulin, random  4. Rheumatoid arthritis involving multiple sites with positive rheumatoid factor ([name]) Check CBC today.  - CBC with Differential/Platelet - COMPLETE METABOLIC PANEL WITH GFR  5. Vitamin D deficiency Low Vitamin D level contributes to fatigue and are associated with obesity, breast, and colon cancer. She agrees to continue to take prescription Vitamin D @50 ,000 IU every week and will follow-up for routine testing of Vitamin D, at least 2-3 times per year to avoid over-replacement. Check level today.  - VITAMIN D 25 Hydroxy (Vit-D Deficiency, Fractures)  6. Class 2 severe obesity with serious comorbidity and body mass index (BMI) of 39.0 to 39.9 in adult, unspecified obesity type ([name]) [name] is currently in the action stage of change. As such, her goal is to continue with weight loss efforts. She has agreed to the Category 3 Plan.   Exercise goals: As is  Behavioral modification strategies: increasing lean protein intake, meal planning and cooking strategies, keeping healthy foods in the home and planning for success.  [name] has agreed to follow-up with our clinic in 3 weeks. She was informed of the importance of frequent follow-up  visits to maximize her success with intensive lifestyle modifications for her multiple health conditions.   Objective:   Blood pressure 110/82, pulse 67, temperature 98.5 F (36.9 C), temperature source Oral, height  5\' 1"  (1.549 m), weight 206 lb (93.4 kg), SpO2 92 %. Body mass index is 38.92 kg/m.  General: Cooperative, alert, well developed, in no acute distress. HEENT: Conjunctivae and lids unremarkable. Cardiovascular: Regular rhythm.  Lungs: Normal work of breathing. Neurologic: No focal deficits.   Lab Results  Component Value Date   CREATININE 1.13 (H) 09/23/2020   BUN 17 09/23/2020   NA 143 09/23/2020   K 4.6 09/23/2020   CL 108 (H) 09/23/2020   CO2 20 09/23/2020   Lab Results  Component Value Date   ALT 14 09/23/2020   AST 20 09/23/2020   ALKPHOS 111 09/23/2020   BILITOT 0.4 09/23/2020   Lab Results  Component Value Date   HGBA1C 5.5 09/23/2020   HGBA1C 5.8 (H) 05/11/2020   HGBA1C 6.2 (H) 12/01/2019   HGBA1C 6.1 (H) 08/26/2019   HGBA1C 5.6 08/20/2018   Lab Results  Component Value Date   INSULIN 25.4 (H) 09/23/2020   INSULIN 21.0 06/01/2020   INSULIN 32.3 (H) 12/01/2019   Lab Results  Component Value Date   TSH 1.550 12/01/2019   Lab Results  Component Value Date   CHOL 108 09/23/2020   HDL 36 (L) 09/23/2020   LDLCALC 39 09/23/2020   TRIG 210 (H) 09/23/2020   CHOLHDL 3.5 08/26/2019   Lab Results  Component Value Date   WBC 5.7 09/23/2020   HGB 11.7 09/23/2020   HCT 35.6 09/23/2020   MCV 85 09/23/2020   PLT 206 09/23/2020   No results found for: IRON, TIBC, FERRITIN  Obesity Behavioral Intervention:   Approximately 15 minutes were spent on the discussion below.  ASK: We discussed the diagnosis of obesity with [name] today and [name] agreed to give [place] permission to discuss obesity behavioral modification therapy today.  ASSESS: [name] has the diagnosis of obesity and her BMI today is 39.0. [name] is in the action stage of change.   ADVISE: [name] was educated on the multiple health risks of obesity as well as the benefit of weight loss to improve her health. She was advised of the need for long term treatment and the importance of lifestyle modifications to  improve her current health and to decrease her risk of future health problems.  AGREE: Multiple dietary modification options and treatment options were discussed and [name] agreed to follow the recommendations documented in the above note.  ARRANGE: [name] was educated on the importance of frequent visits to treat obesity as outlined per CMS and USPSTF guidelines and agreed to schedule her next follow up appointment today.  Attestation Statements:   Reviewed by clinician on day of visit: allergies, medications, problem list, medical history, surgical history, family history, social history, and previous encounter notes.  Coral Ceo, am acting as transcriptionist for Coralie Common, MD.   I have reviewed the above documentation for accuracy and completeness, and I agree with the above. - Jinny Blossom, MD

## 2020-10-05 ENCOUNTER — Ambulatory Visit (INDEPENDENT_AMBULATORY_CARE_PROVIDER_SITE_OTHER): Payer: PPO | Admitting: Psychologist

## 2020-10-05 DIAGNOSIS — F320 Major depressive disorder, single episode, mild: Secondary | ICD-10-CM | POA: Diagnosis not present

## 2020-10-06 ENCOUNTER — Encounter: Payer: Self-pay | Admitting: Physician Assistant

## 2020-10-06 ENCOUNTER — Telehealth: Payer: Self-pay | Admitting: Physician Assistant

## 2020-10-06 NOTE — Telephone Encounter (Signed)
The shingles vaccines are in patients chart. AS, CMA

## 2020-10-06 NOTE — Telephone Encounter (Signed)
[facility] called to notify us that patient was given her second shingles vaccine there on 12-11-19. Pharmacy states it is in [employer].

## 2020-10-11 ENCOUNTER — Other Ambulatory Visit: Payer: Self-pay | Admitting: Adult Health

## 2020-10-18 ENCOUNTER — Encounter (INDEPENDENT_AMBULATORY_CARE_PROVIDER_SITE_OTHER): Payer: Self-pay | Admitting: Family Medicine

## 2020-10-18 ENCOUNTER — Ambulatory Visit (INDEPENDENT_AMBULATORY_CARE_PROVIDER_SITE_OTHER): Payer: PPO | Admitting: Family Medicine

## 2020-10-18 ENCOUNTER — Other Ambulatory Visit: Payer: Self-pay

## 2020-10-18 VITALS — BP 111/49 | HR 70 | Temp 98.3°F | Ht 61.0 in | Wt 206.0 lb

## 2020-10-18 DIAGNOSIS — Z6838 Body mass index (BMI) 38.0-38.9, adult: Secondary | ICD-10-CM | POA: Diagnosis not present

## 2020-10-18 DIAGNOSIS — R7303 Prediabetes: Secondary | ICD-10-CM

## 2020-10-18 DIAGNOSIS — Z6839 Body mass index (BMI) 39.0-39.9, adult: Secondary | ICD-10-CM | POA: Diagnosis not present

## 2020-10-18 DIAGNOSIS — E782 Mixed hyperlipidemia: Secondary | ICD-10-CM

## 2020-10-18 DIAGNOSIS — E559 Vitamin D deficiency, unspecified: Secondary | ICD-10-CM

## 2020-10-18 MED ORDER — TOPIRAMATE 25 MG PO TABS: 25.0000 mg | ORAL_TABLET | Freq: Every day | ORAL | 0 refills | Status: DC

## 2020-10-19 DIAGNOSIS — H6123 Impacted cerumen, bilateral: Secondary | ICD-10-CM | POA: Diagnosis not present

## 2020-10-19 DIAGNOSIS — K118 Other diseases of salivary glands: Secondary | ICD-10-CM | POA: Diagnosis not present

## 2020-10-20 ENCOUNTER — Ambulatory Visit (INDEPENDENT_AMBULATORY_CARE_PROVIDER_SITE_OTHER): Payer: PPO | Admitting: Psychologist

## 2020-10-20 DIAGNOSIS — F320 Major depressive disorder, single episode, mild: Secondary | ICD-10-CM | POA: Diagnosis not present

## 2020-10-20 NOTE — Progress Notes (Signed)
Chief Complaint:   OBESITY [name] is here to discuss her progress with her obesity treatment plan along with follow-up of her obesity related diagnoses. [name] is on the Category 3 Plan and states she is following her eating plan approximately 90% of the time. [name] states she is walking 0 minutes 0 times per week.  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 She is occasionally drinking a coke. She is still having a hard time eating a lot. She gets full very quickly and doesn't have much hunger. If she goes out she often is full after about 5 bites. She is trying to eat protein 1st and the rest of the food later. She has no planned travel in the upcoming few weeks.  Subjective:   1. Pre-diabetes [name]'s last A1c was 5.5 with an insulin level of 25.4. She is not on meds. [name] can't take Metformin due to GI issues.   Lab Results  Component Value Date   HGBA1C 5.5 09/23/2020   Lab Results  Component Value Date   INSULIN 25.4 (H) 09/23/2020   INSULIN 21.0 06/01/2020   INSULIN 32.3 (H) 12/01/2019    2. Vitamin D deficiency [name]'s Vit D level is 92.2. Pt denies nausea, vomiting, and muscle weakness but notes fatigue. Pt is on OTC Vit D.  3. Mixed hyperlipidemia [name] is on Lipitor. Her triglycerides are over 200. She is not on fenofibrate.  Lab Results  Component Value Date   ALT 14 09/23/2020   AST 20 09/23/2020   ALKPHOS 111 09/23/2020   BILITOT 0.4 09/23/2020   Lab Results  Component Value Date   CHOL 108 09/23/2020   HDL 36 (L) 09/23/2020   LDLCALC 39 09/23/2020   TRIG 210 (H) 09/23/2020   CHOLHDL 3.5 08/26/2019    Assessment/Plan:   1. Pre-diabetes [name] will continue to work on weight loss, exercise, and decreasing simple carbohydrates to help decrease the  risk of diabetes. Follow up labs in 3 months.  2. Vitamin D deficiency Discontinue Vit D. Pt doesn't need daily Vit D until next lab. Repeat lab in May 2022.  3. Mixed hyperlipidemia Cardiovascular risk and specific lipid/LDL goals reviewed.  We discussed several lifestyle modifications today and [name] will continue to work on diet, exercise and weight loss efforts. Orders and follow up as documented in patient record. Repeat labs in 3 months.  Counseling Intensive lifestyle modifications are the first line treatment for this issue. . Dietary changes: Increase soluble fiber. Decrease simple carbohydrates. . Exercise changes: Moderate to vigorous-intensity aerobic activity 150 minutes per week if tolerated. . Lipid-lowering medications: see documented in medical record.  4. Class 2 severe obesity with serious comorbidity and body mass index (BMI) of 38.0 to 38.9 in adult, unspecified obesity type ([facility]) [name] is currently in the action stage of change. As such, her goal is to continue with weight loss efforts. She has agreed to the Category 3 Plan with protein substitutions.   We discussed various medication options to help [name] with her weight loss efforts and we both agreed to start Topamax 25 mg, as per below. - topiramate (TOPAMAX) 25 MG tablet; Take 1 tablet (25 mg total) by mouth at bedtime.  Dispense: 30 tablet; Refill: 0  Exercise goals: All adults should avoid  inactivity. Some physical activity is better than none, and adults who participate in any amount of physical activity gain some health benefits.  Behavioral modification strategies: increasing lean protein intake, meal planning and cooking strategies and keeping healthy foods in the home.  [name] has agreed to follow-up with our clinic in 3 weeks. She was informed of the importance of frequent follow-up visits to maximize her success with intensive lifestyle modifications for her multiple health conditions.   Objective:   Blood  pressure (!) 111/49, pulse 70, temperature 98.3 F (36.8 C), temperature source Oral, height 5\' 1"  (1.549 m), weight 206 lb (93.4 kg), SpO2 96 %. Body mass index is 38.92 kg/m.  General: Cooperative, alert, well developed, in no acute distress. HEENT: Conjunctivae and lids unremarkable. Cardiovascular: Regular rhythm.  Lungs: Normal work of breathing. Neurologic: No focal deficits.   Lab Results  Component Value Date   CREATININE 1.13 (H) 09/23/2020   BUN 17 09/23/2020   NA 143 09/23/2020   K 4.6 09/23/2020   CL 108 (H) 09/23/2020   CO2 20 09/23/2020   Lab Results  Component Value Date   ALT 14 09/23/2020   AST 20 09/23/2020   ALKPHOS 111 09/23/2020   BILITOT 0.4 09/23/2020   Lab Results  Component Value Date   HGBA1C 5.5 09/23/2020   HGBA1C 5.8 (H) 05/11/2020   HGBA1C 6.2 (H) 12/01/2019   HGBA1C 6.1 (H) 08/26/2019   HGBA1C 5.6 08/20/2018   Lab Results  Component Value Date   INSULIN 25.4 (H) 09/23/2020   INSULIN 21.0 06/01/2020   INSULIN 32.3 (H) 12/01/2019   Lab Results  Component Value Date   TSH 1.550 12/01/2019   Lab Results  Component Value Date   CHOL 108 09/23/2020   HDL 36 (L) 09/23/2020   LDLCALC 39 09/23/2020   TRIG 210 (H) 09/23/2020   CHOLHDL 3.5 08/26/2019   Lab Results  Component Value Date   WBC 5.7 09/23/2020   HGB 11.7 09/23/2020   HCT 35.6 09/23/2020   MCV 85 09/23/2020   PLT 206 09/23/2020   No results found for: IRON, TIBC, FERRITIN  Obesity Behavioral Intervention:   Approximately 15 minutes were spent on the discussion below.  ASK: We discussed the diagnosis of obesity with [name] today and [name] agreed to give [place] permission to discuss obesity behavioral modification therapy today.  ASSESS: [name] has the diagnosis of obesity and her BMI today is 38.9. [name] is in the action stage of change.   ADVISE: [name] was educated on the multiple health risks of obesity as well as the benefit of weight loss to improve her health. She was  advised of the need for long term treatment and the importance of lifestyle modifications to improve her current health and to decrease her risk of future health problems.  AGREE: Multiple dietary modification options and treatment options were discussed and [name] agreed to follow the recommendations documented in the above note.  ARRANGE: [name] was educated on the importance of frequent visits to treat obesity as outlined per CMS and USPSTF guidelines and agreed to schedule her next follow up appointment today.  Attestation Statements:   Reviewed by clinician on day of visit: allergies, medications, problem list, medical history, surgical history, family history, social history, and previous encounter notes.  Coral Ceo, am acting as transcriptionist for Marisa Common, MD.  I have reviewed the above documentation for accuracy and completeness, and I agree with the above. - Marisa Blossom, MD

## 2020-11-04 ENCOUNTER — Other Ambulatory Visit: Payer: Self-pay | Admitting: Rheumatology

## 2020-11-04 DIAGNOSIS — M3501 Sicca syndrome with keratoconjunctivitis: Secondary | ICD-10-CM

## 2020-11-04 DIAGNOSIS — M0579 Rheumatoid arthritis with rheumatoid factor of multiple sites without organ or systems involvement: Secondary | ICD-10-CM

## 2020-11-04 NOTE — Telephone Encounter (Signed)
Last Visit: 09/23/2020 Next Visit: 02/24/2021 Labs: 09/23/2020, Creatinine 1.17, GFR 48, Chloride 108, Albumin 3.7, Insulin 25.4, Triglycerides 210, HDL 36,   Eye exam: 01/14/2020  Current Dose per office note 09/23/2020,  Zanaflex 4 mg by mouth at bedtime as needed, Arava 20 mg daily and Plaquenil 200 mg 1 tablet by mouth twice daily Monday through Friday  DX:   Rheumatoid arthritis involving multiple sites with positive rheumatoid factor and Fibromyalgia  Last Fill: 07/29/2020  Okay to refill Plaquenil, Arava and Zanaflex?

## 2020-11-08 ENCOUNTER — Other Ambulatory Visit: Payer: Self-pay

## 2020-11-08 ENCOUNTER — Ambulatory Visit (INDEPENDENT_AMBULATORY_CARE_PROVIDER_SITE_OTHER): Payer: PPO | Admitting: Family Medicine

## 2020-11-08 ENCOUNTER — Encounter (INDEPENDENT_AMBULATORY_CARE_PROVIDER_SITE_OTHER): Payer: Self-pay | Admitting: Family Medicine

## 2020-11-08 VITALS — BP 128/81 | HR 83 | Temp 98.3°F | Ht 61.0 in | Wt 212.0 lb

## 2020-11-08 DIAGNOSIS — R7309 Other abnormal glucose: Secondary | ICD-10-CM | POA: Diagnosis not present

## 2020-11-08 DIAGNOSIS — Z6841 Body Mass Index (BMI) 40.0 and over, adult: Secondary | ICD-10-CM | POA: Diagnosis not present

## 2020-11-08 DIAGNOSIS — Z6838 Body mass index (BMI) 38.0-38.9, adult: Secondary | ICD-10-CM

## 2020-11-08 DIAGNOSIS — J3089 Other allergic rhinitis: Secondary | ICD-10-CM

## 2020-11-08 MED ORDER — LEVOCETIRIZINE DIHYDROCHLORIDE 5 MG PO TABS: 5.0000 mg | ORAL_TABLET | Freq: Every evening | ORAL | 0 refills | Status: DC

## 2020-11-08 MED ORDER — TOPIRAMATE 50 MG PO TABS: 50.0000 mg | ORAL_TABLET | Freq: Every day | ORAL | 0 refills | Status: DC

## 2020-11-08 MED ORDER — OZEMPIC (0.25 OR 0.5 MG/DOSE) 2 MG/1.5ML ~~LOC~~ SOPN: 0.2500 mg | PEN_INJECTOR | SUBCUTANEOUS | 0 refills | Status: DC

## 2020-11-10 NOTE — Progress Notes (Signed)
Chief Complaint:   OBESITY [name] is here to discuss her progress with her obesity treatment plan along with follow-up of her obesity related diagnoses. [name] is on the Category 3 Plan and states she is following her eating plan approximately 50% of the time. [name] states she is swimming/pool 2-3 times per week.  Today's visit was #: 8 Starting weight: 239 lbs Starting date: 12/01/2019 Today's weight: 212 lbs Today's date: 11/08/2020 Total lbs lost to date: 27 lbs Total lbs lost since last in-office visit: 0  Interim History: [name] had a fall recently after which she really wasn't able to do much forms of activity. She has plans to start in the pool again this week. Pt voices that the Topamax did not change the taste of soda. She is still trying to work on getting all food in.   Subjective:   1. Elevated random blood glucose level [name]'s blood sugar runs 121-129. She is experiencing a propensity for carbohydrates.   2. Allergic rhinitis due to other allergic trigger, unspecified seasonality [name] is on Xyzal with significant improvement in symptoms. [name] reports significant increase in symptoms with Spring pollen.  Assessment/Plan:   1. Elevated random blood glucose level Fasting labs will be obtained and results with be discussed with [name] in 2 weeks at her follow up visit. In the meanwhile [name] was started on a lower simple carbohydrate diet and will work on weight loss efforts. Start Ozempic 0.25 mg, as per below.  - Semaglutide,0.25 or 0.5MG /DOS, (OZEMPIC, 0.25 OR 0.5 MG/DOSE,) 2 MG/1.5ML SOPN; Inject 0.25 mg into the skin once a week.  Dispense: 1.5 mL; Refill: 0  2. Allergic rhinitis due to other allergic trigger, unspecified seasonality Refill Xyzal 5 mg, as per below.  - levocetirizine (XYZAL) 5 MG tablet; Take 1 tablet (5 mg total) by mouth every evening.  Dispense: 90 tablet; Refill: 0  3. Obesity with current BMI of 40 [name] is currently in the action stage of change.  As such, her goal is to continue with weight loss efforts. She has agreed to the Category 3 Plan.   We discussed various medication options to help [name] with her weight loss efforts and we both agreed to increase Topamax 50 mg, as per below. - topiramate (TOPAMAX) 50 MG tablet; Take 1 tablet (50 mg total) by mouth at bedtime.  Dispense: 30 tablet; Refill: 0  Exercise goals: All adults should avoid inactivity. Some physical activity is better than none, and adults who participate in any amount of physical activity gain some health benefits.  Behavioral modification strategies: increasing lean protein intake, meal planning and cooking strategies and keeping healthy foods in the home.  [name] has agreed to follow-up with our clinic in 3 weeks. She was informed of the importance of frequent follow-up visits to maximize her success with intensive lifestyle modifications for her multiple health conditions.   Objective:   Blood pressure 128/81, pulse 83, temperature 98.3 F (36.8 C), temperature source Oral, height 5\' 1"  (1.549 m), weight 212 lb (96.2 kg), SpO2 98 %. Body mass index is 40.06 kg/m.  General: Cooperative, alert, well developed, in no acute distress. HEENT: Conjunctivae and lids unremarkable. Cardiovascular: Regular rhythm.  Lungs: Normal work of breathing. Neurologic: No focal deficits.   Lab Results  Component Value Date   CREATININE 1.13 (H) 09/23/2020   BUN 17 09/23/2020   NA 143 09/23/2020   K 4.6 09/23/2020   CL 108 (H) 09/23/2020   CO2 20 09/23/2020  Lab Results  Component Value Date   ALT 14 09/23/2020   AST 20 09/23/2020   ALKPHOS 111 09/23/2020   BILITOT 0.4 09/23/2020   Lab Results  Component Value Date   HGBA1C 5.5 09/23/2020   HGBA1C 5.8 (H) 05/11/2020   HGBA1C 6.2 (H) 12/01/2019   HGBA1C 6.1 (H) 08/26/2019   HGBA1C 5.6 08/20/2018   Lab Results  Component Value Date   INSULIN 25.4 (H) 09/23/2020   INSULIN 21.0 06/01/2020   INSULIN 32.3 (H)  12/01/2019   Lab Results  Component Value Date   TSH 1.550 12/01/2019   Lab Results  Component Value Date   CHOL 108 09/23/2020   HDL 36 (L) 09/23/2020   LDLCALC 39 09/23/2020   TRIG 210 (H) 09/23/2020   CHOLHDL 3.5 08/26/2019   Lab Results  Component Value Date   WBC 5.7 09/23/2020   HGB 11.7 09/23/2020   HCT 35.6 09/23/2020   MCV 85 09/23/2020   PLT 206 09/23/2020   No results found for: IRON, TIBC, FERRITIN  Obesity Behavioral Intervention:   Approximately 15 minutes were spent on the discussion below.  ASK: We discussed the diagnosis of obesity with [name] today and Arthelia agreed to give [place] permission to discuss obesity behavioral modification therapy today.  ASSESS: Aurorah has the diagnosis of obesity and her BMI today is 40.1. Aleria is in the action stage of change.   ADVISE: Xiadani was educated on the multiple health risks of obesity as well as the benefit of weight loss to improve her health. She was advised of the need for long term treatment and the importance of lifestyle modifications to improve her current health and to decrease her risk of future health problems.  AGREE: Multiple dietary modification options and treatment options were discussed and Ahjanae agreed to follow the recommendations documented in the above note.  ARRANGE: Mahrukh was educated on the importance of frequent visits to treat obesity as outlined per CMS and USPSTF guidelines and agreed to schedule her next follow up appointment today.  Attestation Statements:   Reviewed by clinician on day of visit: allergies, medications, problem list, medical history, surgical history, family history, social history, and previous encounter notes.  Coral Ceo, am acting as transcriptionist for Coralie Common, MD.   I have reviewed the above documentation for accuracy and completeness, and I agree with the above. - Jinny Blossom, MD

## 2020-11-15 ENCOUNTER — Telehealth (INDEPENDENT_AMBULATORY_CARE_PROVIDER_SITE_OTHER): Payer: Self-pay

## 2020-11-15 NOTE — Telephone Encounter (Signed)
Napsi with Cover My Meds #[id number] Ph [phone]  Regarding Prior Auth for Ozempic.  The DOB on the paperwork submitted has [birth year].  Patient's MRN has DOB as December 08, 1950.  Please resubmit paperwork with correct DOB or call Cover My Meds.  Thank you

## 2020-11-16 DIAGNOSIS — K9089 Other intestinal malabsorption: Secondary | ICD-10-CM | POA: Diagnosis not present

## 2020-11-16 DIAGNOSIS — K2270 Barrett's esophagus without dysplasia: Secondary | ICD-10-CM | POA: Diagnosis not present

## 2020-11-16 DIAGNOSIS — K219 Gastro-esophageal reflux disease without esophagitis: Secondary | ICD-10-CM | POA: Diagnosis not present

## 2020-11-16 DIAGNOSIS — K580 Irritable bowel syndrome with diarrhea: Secondary | ICD-10-CM | POA: Diagnosis not present

## 2020-11-16 NOTE — Telephone Encounter (Signed)
Dr.Ukleja 

## 2020-11-17 ENCOUNTER — Inpatient Hospital Stay: Admission: RE | Admit: 2020-11-17 | Payer: PPO | Source: Ambulatory Visit

## 2020-11-17 ENCOUNTER — Ambulatory Visit (INDEPENDENT_AMBULATORY_CARE_PROVIDER_SITE_OTHER): Payer: PPO | Admitting: Psychologist

## 2020-11-17 DIAGNOSIS — F320 Major depressive disorder, single episode, mild: Secondary | ICD-10-CM | POA: Diagnosis not present

## 2020-11-29 ENCOUNTER — Ambulatory Visit: Payer: PPO | Admitting: Cardiology

## 2020-11-29 ENCOUNTER — Other Ambulatory Visit: Payer: Self-pay

## 2020-11-29 ENCOUNTER — Ambulatory Visit (INDEPENDENT_AMBULATORY_CARE_PROVIDER_SITE_OTHER): Payer: PPO | Admitting: Family Medicine

## 2020-11-29 ENCOUNTER — Encounter (INDEPENDENT_AMBULATORY_CARE_PROVIDER_SITE_OTHER): Payer: Self-pay | Admitting: Family Medicine

## 2020-11-29 VITALS — BP 96/61 | HR 81 | Temp 98.0°F | Ht 61.0 in | Wt 205.0 lb

## 2020-11-29 DIAGNOSIS — E782 Mixed hyperlipidemia: Secondary | ICD-10-CM

## 2020-11-29 DIAGNOSIS — R7309 Other abnormal glucose: Secondary | ICD-10-CM | POA: Diagnosis not present

## 2020-11-29 DIAGNOSIS — Z6841 Body Mass Index (BMI) 40.0 and over, adult: Secondary | ICD-10-CM | POA: Diagnosis not present

## 2020-11-29 DIAGNOSIS — Z6838 Body mass index (BMI) 38.0-38.9, adult: Secondary | ICD-10-CM | POA: Diagnosis not present

## 2020-11-29 MED ORDER — TOPIRAMATE 50 MG PO TABS: 50.0000 mg | ORAL_TABLET | Freq: Every day | ORAL | 0 refills | Status: DC

## 2020-11-29 MED ORDER — OZEMPIC (0.25 OR 0.5 MG/DOSE) 2 MG/1.5ML ~~LOC~~ SOPN: 0.2500 mg | PEN_INJECTOR | SUBCUTANEOUS | 0 refills | Status: DC

## 2020-11-30 ENCOUNTER — Ambulatory Visit
Admission: RE | Admit: 2020-11-30 | Discharge: 2020-11-30 | Disposition: A | Discharge status: Home / Self Care | Payer: PPO | Source: Ambulatory Visit | Attending: Physician Assistant | Admitting: Physician Assistant

## 2020-11-30 DIAGNOSIS — Z1231 Encounter for screening mammogram for malignant neoplasm of breast: Secondary | ICD-10-CM | POA: Diagnosis not present

## 2020-11-30 NOTE — Progress Notes (Signed)
Chief Complaint:   OBESITY [name] is here to discuss her progress with her obesity treatment plan along with follow-up of her obesity related diagnoses. [name] is on the Category 3 Plan and states she is following her eating plan approximately 85% of the time. [name] states she is walking and swimming 30-60 minutes 3 times per week.  Today's visit was #: 48 Starting weight: 239 lbs Starting date: 12/01/2019 Today's weight: 205 lbs Today's date: 11/29/2020 Total lbs lost to date: 34 lbs Total lbs lost since last in-office visit: 7  Interim History: [name] has had a good few weeks and is doing well on Ozempic. She reports no appetite. She is still getting food in and working on making sure she gets all food in.  Subjective:   1. Elevated random blood glucose level [name] is on Ozempic now with a decrease in appetite. She denies GI side effects.  2. Mixed hyperlipidemia [name]'s last LDL was 39, HDL 36, and triglycerides 210. She is on Lipitor.  Assessment/Plan:   1. Elevated random blood glucose level Fasting labs will be obtained and results with be discussed with [name] in 2 weeks at her follow up visit. In the meanwhile [name] was started on a lower simple carbohydrate diet and will work on weight loss efforts.  - Semaglutide,0.25 or 0.5MG /DOS, (OZEMPIC, 0.25 OR 0.5 MG/DOSE,) 2 MG/1.5ML SOPN; Inject 0.25 mg into the skin once a week.  Dispense: 1.5 mL; Refill: 0  2. Mixed hyperlipidemia Cardiovascular risk and specific lipid/LDL goals reviewed.  We discussed several lifestyle modifications today and [name] will continue to work on diet, exercise and weight loss efforts. Orders and follow up as documented in patient record. Continue Lipitor. No refill needed today.  Counseling Intensive lifestyle modifications are the first line treatment for this issue. . Dietary changes: Increase soluble fiber. Decrease simple carbohydrates. . Exercise changes: Moderate to vigorous-intensity aerobic  activity 150 minutes per week if tolerated. . Lipid-lowering medications: see documented in medical record.  3. Obesity with current BMI of 38.8 [name] is currently in the action stage of change. As such, her goal is to continue with weight loss efforts. She has agreed to the Category 2 Plan and the Category 3 Plan.   We discussed various medication options to help [name] with her weight loss efforts and we both agreed to continue Topamax. - topiramate (TOPAMAX) 50 MG tablet; Take 1 tablet (50 mg total) by mouth at bedtime.  Dispense: 30 tablet; Refill: 0  Exercise goals: As is  Behavioral modification strategies: increasing lean protein intake, meal planning and cooking strategies, keeping healthy foods in the home and planning for success.  [name] has agreed to follow-up with our clinic in 3-4 weeks. She was informed of the importance of frequent follow-up visits to maximize her success with intensive lifestyle modifications for her multiple health conditions.   Objective:   Blood pressure 96/61, pulse 81, temperature 98 F (36.7 C), height 5\' 1"  (1.549 m), weight 205 lb (93 kg), SpO2 98 %. Body mass index is 38.73 kg/m.  General: Cooperative, alert, well developed, in no acute distress. HEENT: Conjunctivae and lids unremarkable. Cardiovascular: Regular rhythm.  Lungs: Normal work of breathing. Neurologic: No focal deficits.   Lab Results  Component Value Date   CREATININE 1.13 (H) 09/23/2020   BUN 17 09/23/2020   NA 143 09/23/2020   K 4.6 09/23/2020   CL 108 (H) 09/23/2020   CO2 20 09/23/2020   Lab Results  Component Value  Date   ALT 14 09/23/2020   AST 20 09/23/2020   ALKPHOS 111 09/23/2020   BILITOT 0.4 09/23/2020   Lab Results  Component Value Date   HGBA1C 5.5 09/23/2020   HGBA1C 5.8 (H) 05/11/2020   HGBA1C 6.2 (H) 12/01/2019   HGBA1C 6.1 (H) 08/26/2019   HGBA1C 5.6 08/20/2018   Lab Results  Component Value Date   INSULIN 25.4 (H) 09/23/2020   INSULIN 21.0  06/01/2020   INSULIN 32.3 (H) 12/01/2019   Lab Results  Component Value Date   TSH 1.550 12/01/2019   Lab Results  Component Value Date   CHOL 108 09/23/2020   HDL 36 (L) 09/23/2020   LDLCALC 39 09/23/2020   TRIG 210 (H) 09/23/2020   CHOLHDL 3.5 08/26/2019   Lab Results  Component Value Date   WBC 5.7 09/23/2020   HGB 11.7 09/23/2020   HCT 35.6 09/23/2020   MCV 85 09/23/2020   PLT 206 09/23/2020   No results found for: IRON, TIBC, FERRITIN  Obesity Behavioral Intervention:   Approximately 15 minutes were spent on the discussion below.  ASK: We discussed the diagnosis of obesity with [name] today and [name] agreed to give [place] permission to discuss obesity behavioral modification therapy today.  ASSESS: [name] has the diagnosis of obesity and her BMI today is 38.8. [name] is in the action stage of change.   ADVISE: [name] was educated on the multiple health risks of obesity as well as the benefit of weight loss to improve her health. She was advised of the need for long term treatment and the importance of lifestyle modifications to improve her current health and to decrease her risk of future health problems.  AGREE: Multiple dietary modification options and treatment options were discussed and [name] agreed to follow the recommendations documented in the above note.  ARRANGE: [name] was educated on the importance of frequent visits to treat obesity as outlined per CMS and USPSTF guidelines and agreed to schedule her next follow up appointment today.  Attestation Statements:   Reviewed by clinician on day of visit: allergies, medications, problem list, medical history, surgical history, family history, social history, and previous encounter notes.  Coral Ceo, am acting as transcriptionist for Coralie Common, MD.   I have reviewed the above documentation for accuracy and completeness, and I agree with the above. - Jinny Blossom, MD

## 2020-12-20 ENCOUNTER — Encounter (INDEPENDENT_AMBULATORY_CARE_PROVIDER_SITE_OTHER): Payer: Self-pay | Admitting: Family Medicine

## 2020-12-21 ENCOUNTER — Encounter (INDEPENDENT_AMBULATORY_CARE_PROVIDER_SITE_OTHER): Payer: Self-pay | Admitting: Family Medicine

## 2020-12-28 ENCOUNTER — Other Ambulatory Visit: Payer: Self-pay

## 2020-12-28 ENCOUNTER — Ambulatory Visit (INDEPENDENT_AMBULATORY_CARE_PROVIDER_SITE_OTHER): Payer: PPO | Admitting: Family Medicine

## 2020-12-28 ENCOUNTER — Encounter (INDEPENDENT_AMBULATORY_CARE_PROVIDER_SITE_OTHER): Payer: Self-pay | Admitting: Family Medicine

## 2020-12-28 VITALS — BP 109/69 | HR 69 | Temp 98.1°F | Ht 61.0 in | Wt 195.0 lb

## 2020-12-28 DIAGNOSIS — Z6838 Body mass index (BMI) 38.0-38.9, adult: Secondary | ICD-10-CM | POA: Diagnosis not present

## 2020-12-28 DIAGNOSIS — R632 Polyphagia: Secondary | ICD-10-CM | POA: Diagnosis not present

## 2020-12-28 DIAGNOSIS — R7309 Other abnormal glucose: Secondary | ICD-10-CM | POA: Diagnosis not present

## 2020-12-28 MED ORDER — TOPIRAMATE 50 MG PO TABS: 50.0000 mg | ORAL_TABLET | Freq: Every day | ORAL | 0 refills | Status: DC

## 2020-12-28 MED ORDER — OZEMPIC (0.25 OR 0.5 MG/DOSE) 2 MG/1.5ML ~~LOC~~ SOPN: 0.5000 mg | PEN_INJECTOR | SUBCUTANEOUS | 0 refills | Status: DC

## 2020-12-29 NOTE — Progress Notes (Signed)
Chief Complaint:   OBESITY [name] is here to discuss her progress with her obesity treatment plan along with follow-up of her obesity related diagnoses. [name] is on the Category 2 Plan and the Category 3 Plan and states she is following her eating plan approximately 90-95% of the time. [name] states she is doing water cardio classes 60-90 minutes 3 times per week.  Today's visit was #: 20 Starting weight: 239 lbs Starting date: 12/01/2019 Today's weight: 195 lbs Today's date: 12/28/2020 Total lbs lost to date: 44 Total lbs lost since last in-office visit: 10  Interim History: [name] has been trying to eat a bit more and follow meal plan a bit more. She is now on Ozempic 0.5 mg. She has not been hungry. Pt denies cravings that she has acted on. Soft drinks are now not tasting good.  Subjective:   1. Polyphagia [name] is on Topamax and reports some control. She denies dizziness or fatigue.  2. Elevated random blood glucose level [name] is on Ozempic 0.5 mg. She denies GI side effects. She reports good control of food choices.   Assessment/Plan:   1. Polyphagia Intensive lifestyle modifications are the first line treatment for this issue. We discussed several lifestyle modifications today and she will continue to work on diet, exercise and weight loss efforts. Orders and follow up as documented in patient record.  Counseling . Polyphagia is excessive hunger. . Causes can include: low blood sugars, hypERthyroidism, PMS, lack of sleep, stress, insulin resistance, diabetes, certain medications, and diets that are deficient in protein and fiber.   - topiramate (TOPAMAX) 50 MG tablet; Take 1 tablet (50 mg total) by mouth at bedtime.  Dispense: 30 tablet; Refill: 0  2. Elevated random blood glucose level Fasting labs will be obtained and results with be discussed with [name] in 2 weeks at her follow up visit. In the meanwhile Tezra was started on a lower simple carbohydrate diet and will work on  weight loss efforts.  - Semaglutide,0.25 or 0.5MG /DOS, (OZEMPIC, 0.25 OR 0.5 MG/DOSE,) 2 MG/1.5ML SOPN; Inject 0.5 mg into the skin every 7 (seven) days.  Dispense: 1.5 mL; Refill: 0  3. Obesity with current BMI of 38.8  Dessa is currently in the action stage of change. As such, her goal is to continue with weight loss efforts. She has agreed to the Category 2 Plan and the Category 3 Plan.   Exercise goals: As is- continue some physical activity  Behavioral modification strategies: increasing lean protein intake, meal planning and cooking strategies, keeping healthy foods in the home and planning for success.  [name] has agreed to follow-up with our clinic in 3-4 weeks. She was informed of the importance of frequent follow-up visits to maximize her success with intensive lifestyle modifications for her multiple health conditions.   Objective:   Blood pressure 109/69, pulse 69, temperature 98.1 F (36.7 C), height 5\' 1"  (1.549 m), weight 195 lb (88.5 kg), SpO2 96 %. Body mass index is 36.84 kg/m.  General: Cooperative, alert, well developed, in no acute distress. HEENT: Conjunctivae and lids unremarkable. Cardiovascular: Regular rhythm.  Lungs: Normal work of breathing. Neurologic: No focal deficits.   Lab Results  Component Value Date   CREATININE 1.13 (H) 09/23/2020   BUN 17 09/23/2020   NA 143 09/23/2020   K 4.6 09/23/2020   CL 108 (H) 09/23/2020   CO2 20 09/23/2020   Lab Results  Component Value Date   ALT 14 09/23/2020   AST 20  09/23/2020   ALKPHOS 111 09/23/2020   BILITOT 0.4 09/23/2020   Lab Results  Component Value Date   HGBA1C 5.5 09/23/2020   HGBA1C 5.8 (H) 05/11/2020   HGBA1C 6.2 (H) 12/01/2019   HGBA1C 6.1 (H) 08/26/2019   HGBA1C 5.6 08/20/2018   Lab Results  Component Value Date   INSULIN 25.4 (H) 09/23/2020   INSULIN 21.0 06/01/2020   INSULIN 32.3 (H) 12/01/2019   Lab Results  Component Value Date   TSH 1.550 12/01/2019   Lab Results  Component  Value Date   CHOL 108 09/23/2020   HDL 36 (L) 09/23/2020   LDLCALC 39 09/23/2020   TRIG 210 (H) 09/23/2020   CHOLHDL 3.5 08/26/2019   Lab Results  Component Value Date   WBC 5.7 09/23/2020   HGB 11.7 09/23/2020   HCT 35.6 09/23/2020   MCV 85 09/23/2020   PLT 206 09/23/2020   No results found for: IRON, TIBC, FERRITIN  Obesity Behavioral Intervention:   Approximately 15 minutes were spent on the discussion below.  ASK: We discussed the diagnosis of obesity with [name] today and Chantrell agreed to give [place] permission to discuss obesity behavioral modification therapy today.  ASSESS: Aamira has the diagnosis of obesity and her BMI today is 37.0. Sanjuana is in the action stage of change.   ADVISE: Edlyn was educated on the multiple health risks of obesity as well as the benefit of weight loss to improve her health. She was advised of the need for long term treatment and the importance of lifestyle modifications to improve her current health and to decrease her risk of future health problems.  AGREE: Multiple dietary modification options and treatment options were discussed and Sharnise agreed to follow the recommendations documented in the above note.  ARRANGE: Jermeka was educated on the importance of frequent visits to treat obesity as outlined per CMS and USPSTF guidelines and agreed to schedule her next follow up appointment today.  Attestation Statements:   Reviewed by clinician on day of visit: allergies, medications, problem list, medical history, surgical history, family history, social history, and previous encounter notes.  Coral Ceo, CMA, am acting as transcriptionist for Coralie Common, MD.   I have reviewed the above documentation for accuracy and completeness, and I agree with the above. - Jinny Blossom, MD

## 2021-01-05 ENCOUNTER — Ambulatory Visit: Payer: PPO | Admitting: Cardiology

## 2021-01-11 DIAGNOSIS — K118 Other diseases of salivary glands: Secondary | ICD-10-CM | POA: Diagnosis not present

## 2021-01-12 ENCOUNTER — Other Ambulatory Visit: Payer: Self-pay | Admitting: Physician Assistant

## 2021-01-12 ENCOUNTER — Other Ambulatory Visit (HOSPITAL_COMMUNITY): Payer: Self-pay

## 2021-01-12 ENCOUNTER — Other Ambulatory Visit (INDEPENDENT_AMBULATORY_CARE_PROVIDER_SITE_OTHER): Payer: Self-pay | Admitting: Family Medicine

## 2021-01-12 DIAGNOSIS — J3089 Other allergic rhinitis: Secondary | ICD-10-CM

## 2021-01-12 MED ORDER — TORSEMIDE 40 MG PO TABS: 40.0000 mg | ORAL_TABLET | Freq: Every day | ORAL | 0 refills | Status: AC

## 2021-01-12 NOTE — Telephone Encounter (Signed)
Last Visit: 09/23/2020 Next Visit: 02/24/2021  Current Dose per office note on 09/23/2020, not discussed Dx: Rheumatoid arthritis involving multiple sites with positive rheumatoid factor   Last Fill: 09/24/2020  Okay to refill Phenergan?

## 2021-01-26 ENCOUNTER — Ambulatory Visit: Payer: PPO | Admitting: Physician Assistant

## 2021-01-31 ENCOUNTER — Encounter (INDEPENDENT_AMBULATORY_CARE_PROVIDER_SITE_OTHER): Payer: Self-pay | Admitting: Family Medicine

## 2021-01-31 ENCOUNTER — Ambulatory Visit (INDEPENDENT_AMBULATORY_CARE_PROVIDER_SITE_OTHER): Payer: PPO | Admitting: Family Medicine

## 2021-01-31 ENCOUNTER — Other Ambulatory Visit: Payer: Self-pay

## 2021-01-31 VITALS — BP 103/60 | HR 68 | Temp 98.3°F | Ht 61.0 in | Wt 191.0 lb

## 2021-01-31 DIAGNOSIS — E7849 Other hyperlipidemia: Secondary | ICD-10-CM

## 2021-01-31 DIAGNOSIS — J3089 Other allergic rhinitis: Secondary | ICD-10-CM

## 2021-01-31 DIAGNOSIS — R7309 Other abnormal glucose: Secondary | ICD-10-CM

## 2021-01-31 DIAGNOSIS — Z6841 Body Mass Index (BMI) 40.0 and over, adult: Secondary | ICD-10-CM | POA: Diagnosis not present

## 2021-01-31 DIAGNOSIS — R632 Polyphagia: Secondary | ICD-10-CM | POA: Diagnosis not present

## 2021-01-31 MED ORDER — OZEMPIC (0.25 OR 0.5 MG/DOSE) 2 MG/1.5ML ~~LOC~~ SOPN: 0.5000 mg | PEN_INJECTOR | SUBCUTANEOUS | 0 refills | Status: DC

## 2021-01-31 MED ORDER — LEVOCETIRIZINE DIHYDROCHLORIDE 5 MG PO TABS: 5.0000 mg | ORAL_TABLET | Freq: Every evening | ORAL | 0 refills | Status: DC

## 2021-01-31 MED ORDER — TOPIRAMATE 50 MG PO TABS: 50.0000 mg | ORAL_TABLET | Freq: Every day | ORAL | 0 refills | Status: DC

## 2021-02-01 LAB — COMPREHENSIVE METABOLIC PANEL
ALT: 15 IU/L (ref 0–32)
AST: 16 IU/L (ref 0–40)
Albumin/Globulin Ratio: 1.9 (ref 1.2–2.2)
Albumin: 4.1 g/dL (ref 3.8–4.8)
Alkaline Phosphatase: 143 IU/L — ABNORMAL HIGH (ref 44–121)
BUN/Creatinine Ratio: 16 (ref 12–28)
BUN: 32 mg/dL — ABNORMAL HIGH (ref 8–27)
Bilirubin Total: 0.2 mg/dL (ref 0.0–1.2)
CO2: 17 mmol/L — ABNORMAL LOW (ref 20–29)
Calcium: 9.2 mg/dL (ref 8.7–10.3)
Chloride: 105 mmol/L (ref 96–106)
Creatinine, Ser: 1.94 mg/dL — ABNORMAL HIGH (ref 0.57–1.00)
Globulin, Total: 2.2 g/dL (ref 1.5–4.5)
Glucose: 71 mg/dL (ref 65–99)
Potassium: 4.1 mmol/L (ref 3.5–5.2)
Sodium: 140 mmol/L (ref 134–144)
Total Protein: 6.3 g/dL (ref 6.0–8.5)
eGFR: 28 mL/min/{1.73_m2} — ABNORMAL LOW (ref >59)

## 2021-02-01 LAB — HEMOGLOBIN A1C
Est. average glucose Bld gHb Est-mCnc: 103 mg/dL
Hgb A1c MFr Bld: 5.2 % (ref 4.8–5.6)

## 2021-02-01 LAB — LIPID PANEL WITH LDL/HDL RATIO
Cholesterol, Total: 167 mg/dL (ref 100–199)
HDL: 39 mg/dL — ABNORMAL LOW (ref >39)
LDL Chol Calc (NIH): 86 mg/dL (ref 0–99)
LDL/HDL Ratio: 2.2 ratio (ref 0.0–3.2)
Triglycerides: 253 mg/dL — ABNORMAL HIGH (ref 0–149)
VLDL Cholesterol Cal: 42 mg/dL — ABNORMAL HIGH (ref 5–40)

## 2021-02-01 LAB — INSULIN, RANDOM: INSULIN: 14.2 u[IU]/mL (ref 2.6–24.9)

## 2021-02-01 NOTE — Progress Notes (Signed)
Chief Complaint:   OBESITY [name] is here to discuss her progress with her obesity treatment plan along with follow-up of her obesity related diagnoses. [name] is on the Category 2 Plan and the Category 3 Plan and states she is following her eating plan approximately 85% of the time. [name] states she is swimming, doing water aerobics and walking 2-3 miles 5-7 times per week.  Today's visit was #: 21 Starting weight: 239 lbs Starting date: 12/01/2019 Today's weight: 191 lbs Today's date: 01/31/2021 Total lbs lost to date: 48 Total lbs lost since last in-office visit: 4  Interim History: [name] had a beach trip with her girlfriends over last week. She still can't eat as much as she is supposed to. She feels like her ability to eat has increased her, already, minimal appetite. She denies hunger or cravings. She has been more active in the past few weeks.  Subjective:   1. Elevated random blood glucose level [name] is on Ozempic 0.5 mg weekly.  2. Polyphagia [name] is on Topamax with good control of cravings. She denies side effects.  3. Allergic rhinitis due to other allergic trigger, unspecified seasonality [name] reports good control on Xyzal. She has had no increase in symptoms recently.  4. Other hyperlipidemia [name] is on Lipitor. She denies myalgias.  Assessment/Plan:   1. Elevated random blood glucose level Refill Ozempic 0.5 mg, as written below. Check labs today.  - Semaglutide,0.25 or 0.5MG /DOS, (OZEMPIC, 0.25 OR 0.5 MG/DOSE,) 2 MG/1.5ML SOPN; Inject 0.5 mg into the skin every 7 (seven) days.  Dispense: 1.5 mL; Refill: 0  - Hemoglobin A1c - Insulin, random - Comprehensive metabolic panel  2. Polyphagia Intensive lifestyle modifications are the first line treatment for this issue. We discussed several lifestyle modifications today and she will continue to work on diet, exercise and weight loss efforts. Orders and follow up as documented in patient  record.  Counseling Polyphagia is excessive hunger. Causes can include: low blood sugars, hypERthyroidism, PMS, lack of sleep, stress, insulin resistance, diabetes, certain medications, and diets that are deficient in protein and fiber.   - topiramate (TOPAMAX) 50 MG tablet; Take 1 tablet (50 mg total) by mouth at bedtime.  Dispense: 90 tablet; Refill: 0  3. Allergic rhinitis due to other allergic trigger, unspecified seasonality Continue current treatment plan. Refill Xyzal, as written below.  - levocetirizine (XYZAL) 5 MG tablet; Take 1 tablet (5 mg total) by mouth every evening.  Dispense: 90 tablet; Refill: 0  4. Other hyperlipidemia Cardiovascular risk and specific lipid/LDL goals reviewed.  We discussed several lifestyle modifications today and Manjot will continue to work on diet, exercise and weight loss efforts. Orders and follow up as documented in patient record. Check labs today.  Counseling Intensive lifestyle modifications are the first line treatment for this issue. Dietary changes: Increase soluble fiber. Decrease simple carbohydrates. Exercise changes: Moderate to vigorous-intensity aerobic activity 150 minutes per week if tolerated. Lipid-lowering medications: see documented in medical record.  - Lipid Panel With LDL/HDL Ratio  5. Class 3 severe obesity with serious comorbidity and body mass index (BMI) of 40.0 to 44.9 in adult, unspecified obesity type (HCC)  [name] is currently in the action stage of change. As such, her goal is to continue with weight loss efforts. She has agreed to the Category 2 Plan and the Category 3 Plan.   Exercise goals:  As is  Behavioral modification strategies: increasing lean protein intake, meal planning and cooking strategies, keeping healthy foods in  the home, and planning for success.  [name] has agreed to follow-up with our clinic in 3-4 weeks. She was informed of the importance of frequent follow-up visits to maximize her success with  intensive lifestyle modifications for her multiple health conditions.   [name] was informed we would discuss her lab results at her next visit unless there is a critical issue that needs to be addressed sooner. [name] agreed to keep her next visit at the agreed upon time to discuss these results.  Objective:   Blood pressure 103/60, pulse 68, temperature 98.3 F (36.8 C), height 5\' 1"  (1.549 m), weight 191 lb (86.6 kg), SpO2 99 %. Body mass index is 36.09 kg/m.  General: Cooperative, alert, well developed, in no acute distress. HEENT: Conjunctivae and lids unremarkable. Cardiovascular: Regular rhythm.  Lungs: Normal work of breathing. Neurologic: No focal deficits.   Lab Results  Component Value Date   CREATININE 1.94 (H) 01/31/2021   BUN 32 (H) 01/31/2021   NA 140 01/31/2021   K 4.1 01/31/2021   CL 105 01/31/2021   CO2 17 (L) 01/31/2021   Lab Results  Component Value Date   ALT 15 01/31/2021   AST 16 01/31/2021   ALKPHOS 143 (H) 01/31/2021   BILITOT 0.2 01/31/2021   Lab Results  Component Value Date   HGBA1C 5.2 01/31/2021   HGBA1C 5.5 09/23/2020   HGBA1C 5.8 (H) 05/11/2020   HGBA1C 6.2 (H) 12/01/2019   HGBA1C 6.1 (H) 08/26/2019   Lab Results  Component Value Date   INSULIN 14.2 01/31/2021   INSULIN 25.4 (H) 09/23/2020   INSULIN 21.0 06/01/2020   INSULIN 32.3 (H) 12/01/2019   Lab Results  Component Value Date   TSH 1.550 12/01/2019   Lab Results  Component Value Date   CHOL 167 01/31/2021   HDL 39 (L) 01/31/2021   LDLCALC 86 01/31/2021   TRIG 253 (H) 01/31/2021   CHOLHDL 3.5 08/26/2019   Lab Results  Component Value Date   WBC 5.7 09/23/2020   HGB 11.7 09/23/2020   HCT 35.6 09/23/2020   MCV 85 09/23/2020   PLT 206 09/23/2020   No results found for: IRON, TIBC, FERRITIN  Obesity Behavioral Intervention:   Approximately 15 minutes were spent on the discussion below.  ASK: We discussed the diagnosis of obesity with [name] today and Delana agreed to  give [place] permission to discuss obesity behavioral modification therapy today.  ASSESS: Tashawn has the diagnosis of obesity and her BMI today is 36.1. Leala is in the action stage of change.   ADVISE: Sanja was educated on the multiple health risks of obesity as well as the benefit of weight loss to improve her health. She was advised of the need for long term treatment and the importance of lifestyle modifications to improve her current health and to decrease her risk of future health problems.  AGREE: Multiple dietary modification options and treatment options were discussed and Gladie agreed to follow the recommendations documented in the above note.  ARRANGE: Freya was educated on the importance of frequent visits to treat obesity as outlined per CMS and USPSTF guidelines and agreed to schedule her next follow up appointment today.  Attestation Statements:   Reviewed by clinician on day of visit: allergies, medications, problem list, medical history, surgical history, family history, social history, and previous encounter notes.  Coral Ceo, CMA, am acting as transcriptionist for Coralie Common, MD.   I have reviewed the above documentation for accuracy and completeness, and I agree  with the above. - Jinny Blossom, MD

## 2021-02-02 ENCOUNTER — Ambulatory Visit: Payer: PPO | Admitting: Physician Assistant

## 2021-02-04 ENCOUNTER — Encounter: Payer: Self-pay | Admitting: Nurse Practitioner

## 2021-02-04 ENCOUNTER — Other Ambulatory Visit: Payer: Self-pay

## 2021-02-04 ENCOUNTER — Ambulatory Visit (INDEPENDENT_AMBULATORY_CARE_PROVIDER_SITE_OTHER): Payer: PPO | Admitting: Nurse Practitioner

## 2021-02-04 VITALS — BP 99/63 | HR 56 | Temp 98.4°F | Ht 61.0 in | Wt 192.4 lb

## 2021-02-04 DIAGNOSIS — R7303 Prediabetes: Secondary | ICD-10-CM | POA: Diagnosis not present

## 2021-02-04 DIAGNOSIS — N289 Disorder of kidney and ureter, unspecified: Secondary | ICD-10-CM | POA: Diagnosis not present

## 2021-02-04 DIAGNOSIS — Z6836 Body mass index (BMI) 36.0-36.9, adult: Secondary | ICD-10-CM | POA: Diagnosis not present

## 2021-02-04 DIAGNOSIS — R5383 Other fatigue: Secondary | ICD-10-CM

## 2021-02-04 NOTE — Progress Notes (Signed)
Established Patient Office Visit  Subjective:  Patient ID: [name], female    DOB: [birth year]  Age: 70 y.o. MRN: [id number]  CC:  Chief Complaint  Patient presents with   Follow-up    HPI [name] presents for follow-up visit.  She is concerned about elevated renal functions on most recent labs.  [facility]  Most recent blood work was done 01/31/2021.  She had an elevated creatinine of 1.94 with decreased GFR 28 her BUN was 32.  Renal functions became elevated after increasing dose of Topamax and adding Ozempic to help with weight loss.  She states that she is losing weight however she does not want to continue on medications that may be causing damage to her kidneys.  After reviewing potential negative side effects of both Topamax and Ozempic.  It is found that either or both medications together could be causing the problem with her renal functions.  After discussion we have decided to have her taper off Topamax and we will recheck her renal functions.  She is scheduled for a lab check on 02/21/2021. She does not have current concerns or complaints.  She denies chest pain, chest pressure, or shortness of breath. She denies headaches or visual disturbances. She denies abdominal pain, nausea, vomiting, or changes in bowel or bladder habits.    Past Medical History:  Diagnosis Date   Arthritis    Rheumatoid arthritis,    Back pain    Blood transfusion    1981   CHF (congestive heart failure) (HCC)    Chronic diastolic CHF (congestive heart failure) (HCC)    diastolic    DDD (degenerative disc disease), cervical    DDD (degenerative disc disease), lumbar    Edema of both lower extremities    Fibromyalgia    Gallbladder problem    GERD (gastroesophageal reflux disease)    Heart murmur    as a child   Hiatal hernia    sjorgens syndrome   High blood pressure    High cholesterol    IBS (irritable bowel syndrome)    Joint pain     Numbness and tingling of both feet    Numbness of fingers    OSA (obstructive sleep apnea)    Osteoarthritis    Peripheral autonomic neuropathy of unknown cause    Pneumonia 07/2018   Polyneuropathy    Pre-diabetes    PVC (premature ventricular contraction)    Raynaud disease    Rheumatoid arthritis (HCC)    Sjogren's disease (HCC)    Sleep apnea    SOB (shortness of breath)    Swallowing difficulty    Urticaria    Vitamin D deficiency     Past Surgical History:  Procedure Laterality Date   ABDOMINAL HYSTERECTOMY     BTL, BSO   ADENOIDECTOMY     BREAST SURGERY     mass removal    CHOLECYSTECTOMY     dental implants     DIAGNOSTIC LAPAROSCOPY     x3   KNEE ARTHROSCOPY     x2   MASS EXCISION Left 03/22/2017   Procedure: EXCISION OF LEFT BREAST MASS;  Surgeon: Manus Rudd, MD;  Location: [facility];  Service: General;  Laterality: Left;   patotid cystectomy     RIGHT/LEFT HEART CATH AND CORONARY ANGIOGRAPHY N/A 07/25/2019   Procedure: RIGHT/LEFT HEART CATH AND CORONARY ANGIOGRAPHY;  Surgeon: Dolores Patty, MD;  Location: [facility];  Service: Cardiovascular;  Laterality: N/A;   TENDON REPAIR  1980   left ankle and tibia   TONSILLECTOMY     TOTAL KNEE ARTHROPLASTY  06/21/2011   Procedure: TOTAL KNEE ARTHROPLASTY;  Surgeon: Gearlean Alf;  Location: [facility];  Service: Orthopedics;  Laterality: Right;   TUBAL LIGATION      Family History  Problem Relation Age of Onset   Alzheimer's disease Mother    Heart attack Mother    Hypertension Mother    Glaucoma Mother    High Cholesterol Mother    Heart disease Mother    Depression Mother    Cancer Father    High Cholesterol Father    Heart disease Father    Sleep apnea Father    Heart attack Brother    Heart disease Brother    Glaucoma Brother    Hyperlipidemia Brother    Glaucoma Brother    Asthma Son    Allergic rhinitis Neg Hx    Angioedema Neg Hx    Eczema Neg Hx    Urticaria Neg Hx     Social  History   Socioeconomic History   Marital status: Widowed    Spouse name: Not on file   Number of children: 2   Years of education: AS   Highest education level: Not on file  Occupational History   Occupation: retired Marine scientist   Occupation: Therapist, sports  Tobacco Use   Smoking status: Never   Smokeless tobacco: Never  Vaping Use   Vaping Use: Never used  Substance and Sexual Activity   Alcohol use: Yes    Comment: socially    Drug use: No   Sexual activity: Never  Other Topics Concern   Not on file  Social History Narrative   Lives at home alone   Right-handed   Drinks 1 or less cups of coffee and 2 servings of either tea or soda per day   Social Determinants of Health   Financial Resource Strain: Not on file  Food Insecurity: Not on file  Transportation Needs: Not on file  Physical Activity: Not on file  Stress: Not on file  Social Connections: Not on file  Intimate Partner Violence: Not on file    Outpatient Medications Prior to Visit  Medication Sig Dispense Refill   albuterol (VENTOLIN HFA) 108 (90 Base) MCG/ACT inhaler Inhale 2 puffs into the lungs every 4 (four) hours as needed for wheezing or shortness of breath. 18 g 0   aspirin EC 81 MG tablet Take 81 mg by mouth at bedtime.     atorvastatin (LIPITOR) 20 MG tablet TAKE 1 TABLET BY MOUTH DAILY. 90 tablet 0   Calcium Carbonate-Vit D-Min (GNP CALCIUM 1200) 1200-1000 MG-UNIT CHEW Calcium $RemoveBefo'1200mg'[id number]$  qd OTC 30 tablet    cycloSPORINE (RESTASIS) 0.05 % ophthalmic emulsion Place 2 drops into both eyes 2 (two) times daily.     diphenoxylate-atropine (LOMOTIL) 2.5-0.025 MG per tablet Take 1 tablet by mouth 4 (four) times daily as needed for diarrhea or loose stools.     doxylamine, Sleep, (UNISOM) 25 MG tablet Take 1 tablet (25 mg total) by mouth at bedtime as needed. 30 tablet 0   fluticasone (FLONASE) 50 MCG/ACT nasal spray Place 1 spray into both nostrils daily. 16 g 0   gabapentin (NEURONTIN) 300 MG capsule Take 2 capsules (600 mg  total) by mouth 2 (two) times daily. 360 capsule 3   hyoscyamine (ANASPAZ) 0.125 MG TBDP disintergrating tablet Place 0.25 mg under the tongue every  6 (six) hours as needed for cramping.      leflunomide (ARAVA) 20 MG tablet TAKE 1 TABLET BY MOUTH DAILY. 90 tablet 0   levocetirizine (XYZAL) 5 MG tablet Take 1 tablet (5 mg total) by mouth every evening. 90 tablet 0   lidocaine (LIDODERM) 5 % PLACE 1 PATCH ONTO THE SKIN DAILY AS NEEDED FOR PAIN. REMOVE AND DISCARD PATCH WITHIN 12 HOURS OR AS DIRECTED BY MD 90 patch 0   methocarbamol (ROBAXIN) 500 MG tablet TAKE 1 TABLET BY MOUTH DAILY AS NEEDED FOR MUSCLE SPASMS. 30 tablet 0   NONFORMULARY OR COMPOUNDED ITEM Triamcinolone 0.1% & Silvadene cream 1:1- Apply as directed to affected areas as needed     NONFORMULARY OR COMPOUNDED ITEM      potassium chloride (KLOR-CON) 10 MEQ tablet Take 2 tablets (20 mEq total) by mouth daily. 186 tablet 3   Probiotic Product (PROBIOTIC DAILY PO) Take by mouth. Ultraflora IB Probiotic     promethazine (PHENERGAN) 25 MG tablet TAKE 1 TABLET BY MOUTH EVERY 6 HOURS AS NEEDED. 90 tablet 0   Pseudoephedrine-APAP-DM (DAYQUIL PO) Take by mouth.     Semaglutide,0.25 or 0.5MG /DOS, (OZEMPIC, 0.25 OR 0.5 MG/DOSE,) 2 MG/1.5ML SOPN Inject 0.5 mg into the skin every 7 (seven) days. 1.5 mL 0   spironolactone (ALDACTONE) 25 MG tablet TAKE 1 TABLET BY MOUTH 2 TIMES DAILY. 180 tablet 3   topiramate (TOPAMAX) 50 MG tablet Take 1 tablet (50 mg total) by mouth at bedtime. 90 tablet 0   torsemide 40 MG TABS Take 40 mg by mouth daily. 30 tablet 0   UNABLE TO FIND CPAP: AT bedtime; setting is "12"     hydroxychloroquine (PLAQUENIL) 200 MG tablet TAKE 1 TABLET BY MOUTH TWICE A DAY MONDAY THROUGH FRIDAY. 120 tablet 0   metoprolol succinate (TOPROL-XL) 25 MG 24 hr tablet TAKE 1 TABLET BY MOUTH DAILY. PLEASE KEEP UPCOMING APPT WITH DR. Radford Pax IN [facility] FOR FUTURE REFILLS. THANK YOU 90 tablet 3   tiZANidine (ZANAFLEX) 4 MG tablet TAKE 1 TABLET BY  MOUTH AT BEDTIME. 90 tablet 0   guaiFENesin-Codeine 100-6.3 MG/5ML SOLN Take by mouth. (Patient not taking: Reported on 02/04/2021)     No facility-administered medications prior to visit.    Allergies  Allergen Reactions   Sulfa Antibiotics Hives and Itching    Flushing also   Cymbalta [Duloxetine Hcl] Other (See Comments)    Caused a manic reaction   Demerol Hives and Other (See Comments)    Fever also   Ivp Dye [Iodinated Diagnostic Agents] Hives and Other (See Comments)    Fever also   Morphine And Related Other (See Comments)    ineffective   Sulfasalazine Other (See Comments)    Reaction ??   Adhesive [Tape] Rash    ROS Review of Systems  Constitutional:  Positive for fatigue. Negative for activity change, chills and fever.       She has had a 13 pound weight loss since April 2022.  HENT:  Negative for congestion, postnasal drip, rhinorrhea, sinus pressure and sinus pain.   Eyes: Negative.   Respiratory:  Negative for cough, chest tightness, shortness of breath and wheezing.   Cardiovascular:  Negative for chest pain and palpitations.  Gastrointestinal:  Negative for constipation, diarrhea, nausea and vomiting.  Endocrine: Negative for cold intolerance, heat intolerance, polydipsia and polyuria.       Improving blood sugar since starting on Ozempic.  Musculoskeletal:  Positive for arthralgias. Negative for back pain and  myalgias.  Skin:  Negative for rash.  Allergic/Immunologic: Negative.   Neurological:  Negative for dizziness, weakness and headaches.  Psychiatric/Behavioral:  The patient is nervous/anxious.      Objective:    Physical Exam Vitals and nursing note reviewed.  Constitutional:      Appearance: Normal appearance. She is well-developed. She is obese.  HENT:     Head: Normocephalic and atraumatic.     Nose: Nose normal.     Mouth/Throat:     Mouth: Mucous membranes are moist.  Eyes:     Extraocular Movements: Extraocular movements intact.      Conjunctiva/sclera: Conjunctivae normal.     Pupils: Pupils are equal, round, and reactive to light.  Cardiovascular:     Rate and Rhythm: Normal rate and regular rhythm.     Pulses: Normal pulses.     Heart sounds: Normal heart sounds.  Pulmonary:     Effort: Pulmonary effort is normal.     Breath sounds: Normal breath sounds.  Abdominal:     Palpations: Abdomen is soft.  Musculoskeletal:        General: Normal range of motion.     Cervical back: Normal range of motion and neck supple.  Lymphadenopathy:     Cervical: No cervical adenopathy.  Skin:    General: Skin is warm and dry.     Capillary Refill: Capillary refill takes less than 2 seconds.  Neurological:     General: No focal deficit present.     Mental Status: She is alert and oriented to person, place, and time.  Psychiatric:        Mood and Affect: Mood normal.        Behavior: Behavior normal.        Thought Content: Thought content normal.        Judgment: Judgment normal.   Today's Vitals   02/04/21 1130  BP: 99/63  Pulse: (!) 56  Temp: 98.4 F (36.9 C)  SpO2: 97%  Weight: 192 lb 6.4 oz (87.3 kg)   Body mass index is 36.35 kg/m.   Wt Readings from Last 3 Encounters:  02/04/21 192 lb 6.4 oz (87.3 kg)  01/31/21 191 lb (86.6 kg)  12/28/20 195 lb (88.5 kg)     Health Maintenance Due  Topic Date Due   COVID-19 Vaccine (4 - Booster for Pfizer series) 07/02/2020    There are no preventive care reminders to display for this patient.  Lab Results  Component Value Date   TSH 1.550 12/01/2019   Lab Results  Component Value Date   WBC 6.6 02/04/2021   HGB 12.7 02/04/2021   HCT 38.4 02/04/2021   MCV 86 02/04/2021   PLT 170 02/04/2021   Lab Results  Component Value Date   NA 140 01/31/2021   K 4.1 01/31/2021   CO2 17 (L) 01/31/2021   GLUCOSE 71 01/31/2021   BUN 32 (H) 01/31/2021   CREATININE 1.94 (H) 01/31/2021   BILITOT 0.2 01/31/2021   ALKPHOS 143 (H) 01/31/2021   AST 16 01/31/2021   ALT  15 01/31/2021   PROT 6.3 01/31/2021   ALBUMIN 4.1 01/31/2021   CALCIUM 9.2 01/31/2021   ANIONGAP 13 08/05/2019   EGFR 28 (L) 01/31/2021   GFR 72.81 04/27/2015   Lab Results  Component Value Date   CHOL 167 01/31/2021   Lab Results  Component Value Date   HDL 39 (L) 01/31/2021   Lab Results  Component Value Date   LDLCALC 86 01/31/2021  Lab Results  Component Value Date   TRIG 253 (H) 01/31/2021   Lab Results  Component Value Date   CHOLHDL 3.5 08/26/2019   Lab Results  Component Value Date   HGBA1C 5.2 01/31/2021      Assessment & Plan:  1. Other fatigue Check CBC while in the office today. - CBC with Differential/Platelet  2. Abnormal kidney function Elevated renal functions [facility].  Elevation coincides with increased Topamax dose and addition of Ozempic.  We will have patient wean down and off Topamax.  We will recheck BMP and follow-up in 3 months.  3. Body mass index (BMI) of 36.0-36.9 in adult Patiently currently a [facility].  She has lost 13 pounds since April.  Weaning off Topamax but will continue Ozempic.  She will follow up with them routinely.  4. Prediabetes Patient currently on Ozempic to help control blood sugars and prevent development of diabetes.  Also prescribed to help with weight management.  Continue current dose as prescribed.  [facility].  Monitor renal functions closely.  Problem List Items Addressed This Visit       Other   Abnormal kidney function   Other fatigue - Primary   Relevant Orders   CBC with Differential/Platelet (Completed)   Body mass index (BMI) of 36.0-36.9 in adult   Prediabetes     Follow-up: Return in about 3 months (around 05/07/2021) for high cholesterol and CKD - needs lab, BMP drawn on 02/21/2021 in AM.    Ronnell Freshwater, NP

## 2021-02-05 LAB — CBC WITH DIFFERENTIAL/PLATELET
Basophils Absolute: 0.1 10*3/uL (ref 0.0–0.2)
Basos: 1 %
EOS (ABSOLUTE): 0.1 10*3/uL (ref 0.0–0.4)
Eos: 2 %
Hematocrit: 38.4 % (ref 34.0–46.6)
Hemoglobin: 12.7 g/dL (ref 11.1–15.9)
Immature Grans (Abs): 0.1 10*3/uL (ref 0.0–0.1)
Immature Granulocytes: 1 %
Lymphocytes Absolute: 1 10*3/uL (ref 0.7–3.1)
Lymphs: 16 %
MCH: 28.3 pg (ref 26.6–33.0)
MCHC: 33.1 g/dL (ref 31.5–35.7)
MCV: 86 fL (ref 79–97)
Monocytes Absolute: 0.6 10*3/uL (ref 0.1–0.9)
Monocytes: 8 %
Neutrophils Absolute: 4.8 10*3/uL (ref 1.4–7.0)
Neutrophils: 72 %
Platelets: 170 10*3/uL (ref 150–450)
RBC: 4.48 x10E6/uL (ref 3.77–5.28)
RDW: 13.9 % (ref 11.7–15.4)
WBC: 6.6 10*3/uL (ref 3.4–10.8)

## 2021-02-06 NOTE — Progress Notes (Signed)
CBC is normal.

## 2021-02-07 ENCOUNTER — Encounter: Payer: Self-pay | Admitting: Rheumatology

## 2021-02-07 ENCOUNTER — Other Ambulatory Visit: Payer: Self-pay | Admitting: Cardiology

## 2021-02-07 ENCOUNTER — Other Ambulatory Visit: Payer: Self-pay | Admitting: Physician Assistant

## 2021-02-07 DIAGNOSIS — M0579 Rheumatoid arthritis with rheumatoid factor of multiple sites without organ or systems involvement: Secondary | ICD-10-CM

## 2021-02-07 DIAGNOSIS — R944 Abnormal results of kidney function studies: Secondary | ICD-10-CM

## 2021-02-07 DIAGNOSIS — M3501 Sicca syndrome with keratoconjunctivitis: Secondary | ICD-10-CM

## 2021-02-07 NOTE — Telephone Encounter (Signed)
Please advise patient to get an eye examination.  Her GFR has dropped down significantly.  Please discuss with patient if she is seeing a nephrologist.  Decrease hydroxychloroquine to 1 tablet p.o. daily Monday to Friday.  Decrease tizanidine to 2 mg p.o. nightly.

## 2021-02-07 NOTE — Telephone Encounter (Signed)
Patent to get an eye examination.  Her GFR has dropped down significantly.  Patient states she is not seeing a nephrologist. Patient states she is working with her PCP to address. Patient advised to Decrease hydroxychloroquine to 1 tablet p.o. daily Monday to Friday.  Decrease tizanidine to 2 mg p.o. nightly. Patient was not happy with recommendations but states she will follow them.

## 2021-02-07 NOTE — Telephone Encounter (Signed)
Last Visit: 09/23/2020  Next Visit: 02/24/2021  Labs: 01/31/2021 BUN 32, Creat. 1.94 GFR 28, CO 2 17, Alk. Phos. [place] Exam: 01/14/2020 WNL  Current Dose per office note on 09/23/2020, not discussed  Dx:  Rheumatoid arthritis involving multiple sites with positive rheumatoid factor   Current Dose per office note 09/23/2020,  Zanaflex 4 mg by mouth at bedtime as needed, Plaquenil 200 mg 1 tablet by mouth twice daily Monday through Friday  Okay to refill PLQ and Tizanidine?

## 2021-02-07 NOTE — Telephone Encounter (Signed)
I called patient and advised that she should see a nephrologist.  She stated that she has seen a nephrologist in the past but right now her PCP is monitoring her renal functions and the PCP believes that the drop in the renal function is related to Topamax.  She is tapering off Topamax and we will recheck her creatinine.

## 2021-02-08 ENCOUNTER — Encounter: Payer: Self-pay | Admitting: Rheumatology

## 2021-02-08 DIAGNOSIS — Z79899 Other long term (current) drug therapy: Secondary | ICD-10-CM | POA: Diagnosis not present

## 2021-02-08 DIAGNOSIS — M069 Rheumatoid arthritis, unspecified: Secondary | ICD-10-CM | POA: Diagnosis not present

## 2021-02-08 DIAGNOSIS — H524 Presbyopia: Secondary | ICD-10-CM | POA: Diagnosis not present

## 2021-02-08 DIAGNOSIS — H40013 Open angle with borderline findings, low risk, bilateral: Secondary | ICD-10-CM | POA: Diagnosis not present

## 2021-02-08 DIAGNOSIS — H04123 Dry eye syndrome of bilateral lacrimal glands: Secondary | ICD-10-CM | POA: Diagnosis not present

## 2021-02-08 LAB — HM DIABETES EYE EXAM

## 2021-02-08 NOTE — Telephone Encounter (Signed)
Please placed the referral to nephrology and notify patient.

## 2021-02-11 NOTE — Progress Notes (Signed)
Office Visit Note  Patient: [name]             Date of Birth: 17-Jul-1951           MRN: [id number]             PCP: Lorrene Reid, PA-C Referring: Lorrene Reid, PA-C Visit Date: 02/24/2021 Occupation: @GUAROCC @  Subjective:  Pain in multiple joints   History of Present Illness: [name] is a 70 y.o. female with history of rheumatoid arthritis, sicca symptoms, osteoarthritis, degenerative disc disease and fibromyalgia syndrome.  She states she continues to have pain and discomfort in her hands, knee joints, lower back and SI joints.  She has not noticed any increased joint swelling.  She has significant stiffness every morning.  She also has neuropathy for which she has been followed by Dr. Jannifer Franklin.  Her Raynauds symptoms are not very active currently during the warmer weather.  Activities of Daily Living:  Patient reports morning stiffness for several hours.   Patient Reports nocturnal pain.  Difficulty dressing/grooming: Reports Difficulty climbing stairs: Denies Difficulty getting out of chair: Denies Difficulty using hands for taps, buttons, cutlery, and/or writing: Reports  Review of Systems  Constitutional:  Positive for fatigue.  HENT:  Positive for mouth dryness. Negative for mouth sores and nose dryness.   Eyes:  Positive for itching and dryness. Negative for pain and visual disturbance.  Respiratory:  Positive for cough. Negative for hemoptysis, shortness of breath and difficulty breathing.   Cardiovascular:  Positive for palpitations. Negative for chest pain and swelling in legs/feet.  Gastrointestinal:  Positive for constipation and diarrhea. Negative for abdominal pain and blood in stool.  Endocrine: Negative for increased urination.  Genitourinary:  Negative for painful urination.  Musculoskeletal:  Positive for joint pain, joint pain, joint swelling, myalgias, muscle weakness, morning stiffness, muscle tenderness and myalgias.  Skin:  Negative for  color change, rash and redness.  Allergic/Immunologic: Negative for susceptible to infections.  Neurological:  Positive for numbness. Negative for dizziness, headaches, memory loss and weakness.  Hematological:  Negative for swollen glands.  Psychiatric/Behavioral:  Positive for sleep disturbance. Negative for confusion.    PMFS History:  Patient Active Problem List   Diagnosis Date Noted   Other fatigue 02/14/2021   Body mass index (BMI) of 36.0-36.9 in adult 02/14/2021   Prediabetes 02/14/2021   Abnormal kidney function 08/01/2020   Muscle spasm of back 08/01/2020   Microalbuminuria 08/01/2020   Post-menopausal 09/02/2019   Pain in left knee 08/23/2018   Lumbar pain 07/04/2018   Recurrent urticaria 05/20/2018   Rhinitis 05/20/2018   Vitamin D deficiency 11/20/2017   Mixed hyperlipidemia 11/20/2017   Family history of coronary arteriosclerosis- strong fam h/o CAD and early CAD.  07/17/2017   Raynaud's disease without gangrene 03/12/2017   Abnormal weight gain 02/06/2017   Shingles outbreak 02/06/2017   Cyst (solitary) of breast, left 49/70/2637   Eosinophilic esophagitis 85/88/5027   History of hyperlipidemia 10/04/2016   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   History of diverticulitis 09/03/2016   Osteoporosis 09/03/2016   Autoimmune disease ([place]) 09/02/2016   High risk medication use 09/02/2016   History of esophagitis 09/02/2016   Elevated triglycerides with high cholesterol 08/23/2016   Low serum HDL 08/23/2016   Breast cyst, left 06/14/2016   Encounter for wellness examination 05/23/2016   Abnormality of gait 05/09/2016   Sjoegren syndrome 02/29/2016   GERD (gastroesophageal reflux disease) 01/19/2016   Glucose intolerance (impaired glucose tolerance) 01/19/2016  Chronic diastolic CHF (congestive heart failure) ([place]) 04/20/2015   Peripheral polyneuropathy 02/02/2014   Morbid obesity ([place]) 11/17/2013   Heart murmur    OSA (obstructive sleep apnea)     Rheumatoid arthritis (HCC)    Hiatal hernia    Fibromyalgia    PVC's (premature ventricular contractions)    History of total knee replacement, right 06/24/2011   Unilateral primary osteoarthritis, left knee 06/22/2011    Past Medical History:  Diagnosis Date   Arthritis    Rheumatoid arthritis,    Back pain    Blood transfusion    1981   CHF (congestive heart failure) (HCC)    Chronic diastolic CHF (congestive heart failure) (HCC)    diastolic    DDD (degenerative disc disease), cervical    DDD (degenerative disc disease), lumbar    Edema of both lower extremities    Fibromyalgia    Gallbladder problem    GERD (gastroesophageal reflux disease)    Heart murmur    as a child   Hiatal hernia    sjorgens syndrome   High blood pressure    High cholesterol    IBS (irritable bowel syndrome)    Joint pain    Numbness and tingling of both feet    Numbness of fingers    OSA (obstructive sleep apnea)    Osteoarthritis    Peripheral autonomic neuropathy of unknown cause    Pneumonia 07/2018   Polyneuropathy    Pre-diabetes    PVC (premature ventricular contraction)    Raynaud disease    Rheumatoid arthritis ([place])    Sjogren's disease ([place])    Sleep apnea    SOB (shortness of breath)    Swallowing difficulty    Urticaria    Vitamin D deficiency     Family History  Problem Relation Age of Onset   Alzheimer's disease Mother    Heart attack Mother    Hypertension Mother    Glaucoma Mother    High Cholesterol Mother    Heart disease Mother    Depression Mother    Cancer Father    High Cholesterol Father    Heart disease Father    Sleep apnea Father    Heart attack Brother    Heart disease Brother    Glaucoma Brother    Hyperlipidemia Brother    Glaucoma Brother    Asthma Son    Allergic rhinitis Neg Hx    Angioedema Neg Hx    Eczema Neg Hx    Urticaria Neg Hx    Past Surgical History:  Procedure Laterality Date   ABDOMINAL HYSTERECTOMY     BTL, BSO    ADENOIDECTOMY     BREAST SURGERY     mass removal    CHOLECYSTECTOMY     dental implants     DIAGNOSTIC LAPAROSCOPY     x3   KNEE ARTHROSCOPY     x2   MASS EXCISION Left 03/22/2017   Procedure: EXCISION OF LEFT BREAST MASS;  Surgeon: Donnie Mesa, MD;  Location: [facility];  Service: General;  Laterality: Left;   patotid cystectomy     RIGHT/LEFT HEART CATH AND CORONARY ANGIOGRAPHY N/A 07/25/2019   Procedure: RIGHT/LEFT HEART CATH AND CORONARY ANGIOGRAPHY;  Surgeon: Jolaine Artist, MD;  Location: [facility];  Service: Cardiovascular;  Laterality: N/A;   TENDON REPAIR  1980   left ankle and tibia   TONSILLECTOMY     TOTAL KNEE ARTHROPLASTY  06/21/2011   Procedure: TOTAL KNEE  ARTHROPLASTY;  Surgeon: Gearlean Alf;  Location: [facility];  Service: Orthopedics;  Laterality: Right;   TUBAL LIGATION     Social History   Social History Narrative   Lives at home alone   Right-handed   Drinks 1 or less cups of coffee and 2 servings of either tea or soda per day   Immunization History  Administered Date(s) Administered   Influenza, High Dose Seasonal PF 06/12/2018, 05/17/2019   Influenza,inj,Quad PF,6+ Mos 05/23/2016, 06/01/2017   Influenza-Unspecified 06/05/2018, 05/17/2019, 06/07/2020   PFIZER(Purple Top)SARS-COV-2 Vaccination 09/03/2019, 09/24/2019, 04/01/2020   Pneumococcal Conjugate-13 07/02/2017   Pneumococcal Polysaccharide-23 05/17/2005   Td 10/16/2007   Tdap 03/21/2018   Zoster Recombinat (Shingrix) 06/10/2019, 12/11/2019     Objective: Vital Signs: BP 122/78 (BP Location: Left Arm, Patient Position: Sitting, Cuff Size: Normal)   Pulse (!) 45   Ht 5\' 1"  (1.549 m)   Wt 192 lb 12.8 oz (87.5 kg)   BMI 36.43 kg/m    Physical Exam Vitals and nursing note reviewed.  Constitutional:      Appearance: She is well-developed.  HENT:     Head: Normocephalic and atraumatic.  Eyes:     Conjunctiva/sclera: Conjunctivae normal.  Cardiovascular:     Rate and Rhythm:  Normal rate and regular rhythm.     Heart sounds: Normal heart sounds.  Pulmonary:     Effort: Pulmonary effort is normal.     Breath sounds: Normal breath sounds.  Abdominal:     General: Bowel sounds are normal.     Palpations: Abdomen is soft.  Musculoskeletal:     Cervical back: Normal range of motion.  Lymphadenopathy:     Cervical: No cervical adenopathy.  Skin:    General: Skin is warm and dry.     Capillary Refill: Capillary refill takes less than 2 seconds.  Neurological:     Mental Status: She is alert and oriented to person, place, and time.  Psychiatric:        Behavior: Behavior normal.     Musculoskeletal Exam: C-spine was in good range of motion.  She has severe kyphosis and thoracoscoliosis.  She had lymph node palpable in the left cervical chain.  Shoulder joints and elbow joints with good range of motion.  She had no synovitis of her wrist joints and MCPs.  PIP and DIP thickening was noted.  Hip joints and knee joints in good range of motion with no synovitis.  There was no tenderness over ankles or MTPs.  CDAI Exam: CDAI Score: 0.4  Patient Global: 2 mm; Provider Global: 2 mm Swollen: 0 ; Tender: 0  Joint Exam 02/24/2021   No joint exam has been documented for this visit   There is currently no information documented on the homunculus. Go to the Rheumatology activity and complete the homunculus joint exam.  Investigation: No additional findings.  Imaging: No results found.  Recent Labs: Lab Results  Component Value Date   WBC 6.6 02/04/2021   HGB 12.7 02/04/2021   PLT 170 02/04/2021   NA 140 01/31/2021   K 4.1 01/31/2021   CL 105 01/31/2021   CO2 17 (L) 01/31/2021   GLUCOSE 71 01/31/2021   BUN 32 (H) 01/31/2021   CREATININE 1.94 (H) 01/31/2021   BILITOT 0.2 01/31/2021   ALKPHOS 143 (H) 01/31/2021   AST 16 01/31/2021   ALT 15 01/31/2021   PROT 6.3 01/31/2021   ALBUMIN 4.1 01/31/2021   CALCIUM 9.2 01/31/2021   GFRAA 58 (L) 09/23/2020  Speciality Comments: PLQ Eye Exam: 01/14/2020 [facility] follow up in 1 year Patient had PLQ eye exam updated in 01/2021- PLQ eye exam has been faxed to [facility] ([phone])  Procedures:  No procedures performed Allergies: Sulfa antibiotics, Cymbalta [duloxetine hcl], Demerol, Ivp dye [iodinated diagnostic agents], Morphine and related, Sulfasalazine, and Adhesive [tape]   Assessment / Plan:     Visit Diagnoses: Rheumatoid arthritis involving multiple sites with positive rheumatoid factor ([place]) - Positive RF, positive ANA.  Patient had no synovitis on examination today.  She continues to have pain and discomfort due to underlying osteoarthritis.  High risk medication use - Arava 20 mg daily and Plaquenil 200 mg 1 tablet by mouth daily Monday through Friday. D/c IV orencia due to cost.PLQ Eye Exam: 01/14/2020.  Labs on February 04, 2021 showed GFR 28.  She was advised to schedule an appointment with the nephrologist.  She is still awaiting to hear from the nephrology.  I discussed reducing the dose of leflunomide to 10 mg.  She does not want to decrease the dose yet.  She has been advised to stop leflunomide and Plaquenil in case she develops an infection.  She may resume medications once infection resolves.  Updated information regarding realization was placed in the AVS.  Chronic kidney disease a stage IV-GFR 28 patient has sudden drop in her GFR between February and June.  She is awaiting nephrology appointment.  Sjogren's syndrome with keratoconjunctivitis sicca (HCC) - Plan: ANA, Anti-DNA antibody, double-stranded, Sjogrens syndrome-A extractable nuclear antibody, Sjogrens syndrome-B extractable nuclear antibody, C3 and C4, Serum protein electrophoresis with reflex with next labs.  Raynaud's disease without gangrene-Raynauds symptoms are currently not active.  Unilateral primary osteoarthritis, left knee-she has been experiencing pain and discomfort in her knee joints but no  synovitis was noted.  History of total knee replacement, right-doing well.  Chronic SI joint pain-she has intermittent SI joint discomfort.  DDD (degenerative disc disease), lumbar - X-ray showed severe levoscoliosis and multilevel kissing osteophytes.  She has chronic lower back pain.  Fibromyalgia - methocarbamol and Zanaflex on as needed basis.  Age-related osteoporosis without current pathological fracture - The BMD measured at [facility] Total Right is 0.782 g/cm2 with a T-scoreof -1.8. 08/2019.  Bradycardia-heart rate was 45 in the office today.  She states she wears an apple watch and her heart rate has been running low.  Advised her to contact her cardiologist today.  She states she will contact the cardiologist office as soon as she leaves the office.  History of hyperlipidemia  Eosinophilic esophagitis  History of peripheral neuropathy  History of depression  History of cardiac murmur  History of diabetes mellitus  History of diverticulitis  Orders: Orders Placed This Encounter  Procedures   ANA   Anti-DNA antibody, double-stranded   Sjogrens syndrome-A extractable nuclear antibody   Sjogrens syndrome-B extractable nuclear antibody   C3 and C4   Serum protein electrophoresis with reflex    No orders of the defined types were placed in this encounter.    Follow-Up Instructions: Return in about 3 months (around 05/27/2021) for Rheumatoid arthritis.   Bo Merino, MD  Note - This record has been created using Editor, commissioning.  Chart creation errors have been sought, but may not always  have been located. Such creation errors do not reflect on  the standard of medical care.

## 2021-02-14 DIAGNOSIS — R7303 Prediabetes: Secondary | ICD-10-CM | POA: Insufficient documentation

## 2021-02-14 DIAGNOSIS — R5383 Other fatigue: Secondary | ICD-10-CM | POA: Insufficient documentation

## 2021-02-14 DIAGNOSIS — Z6836 Body mass index (BMI) 36.0-36.9, adult: Secondary | ICD-10-CM | POA: Insufficient documentation

## 2021-02-14 NOTE — Patient Instructions (Signed)
Fat and Cholesterol Restricted Eating Plan Getting too much fat and cholesterol in your diet may cause health problems. Choosing the right foods helps keep your fat and cholesterol at normal levels.This can keep you from getting certain diseases. Your doctor may recommend an eating plan that includes: Total fat: ______% or less of total calories a day. Saturated fat: ______% or less of total calories a day. Cholesterol: less than _________mg a day. Fiber: ______g a day. What are tips for following this plan? Meal planning At meals, divide your plate into four equal parts: Fill one-half of your plate with vegetables and green salads. Fill one-fourth of your plate with whole grains. Fill one-fourth of your plate with low-fat (lean) protein foods. Eat fish that is high in omega-3 fats at least two times a week. This includes mackerel, tuna, sardines, and salmon. Eat foods that are high in fiber, such as whole grains, beans, apples, broccoli, carrots, peas, and barley. General tips  Work with your doctor to lose weight if you need to. Avoid: Foods with added sugar. Fried foods. Foods with partially hydrogenated oils. Limit alcohol intake to no more than 1 drink a day for nonpregnant women and 2 drinks a day for men. One drink equals 12 oz of beer, 5 oz of wine, or 1 oz of hard liquor.  Reading food labels Check food labels for: Trans fats. Partially hydrogenated oils. Saturated fat (g) in each serving. Cholesterol (mg) in each serving. Fiber (g) in each serving. Choose foods with healthy fats, such as: Monounsaturated fats. Polyunsaturated fats. Omega-3 fats. Choose grain products that have whole grains. Look for the word "whole" as the first word in the ingredient list. Cooking Cook foods using low-fat methods. These include baking, boiling, grilling, and broiling. Eat more home-cooked foods. Eat at restaurants and buffets less often. Avoid cooking using saturated fats, such as  butter, cream, palm oil, palm kernel oil, and coconut oil. Recommended foods  Fruits All fresh, canned (in natural juice), or frozen fruits. Vegetables Fresh or frozen vegetables (raw, steamed, roasted, or grilled). Green salads. Grains Whole grains, such as whole wheat or whole grain breads, crackers, cereals, and pasta. Unsweetened oatmeal, bulgur, barley, quinoa, or brown rice. Corn or whole wheat flour tortillas. Meats and other protein foods Ground beef (85% or leaner), grass-fed beef, or beef trimmed of fat. Skinless chicken or turkey. Ground chicken or turkey. Pork trimmed of fat. All fish and seafood. Egg whites. Dried beans, peas, or lentils. Unsalted nuts or seeds. Unsalted canned beans. Nut butters without added sugar or oil. Dairy Low-fat or nonfat dairy products, such as skim or 1% milk, 2% or reduced-fat cheeses, low-fat and fat-free ricotta or cottage cheese, or plain low-fat and nonfat yogurt. Fats and oils Tub margarine without trans fats. Light or reduced-fat mayonnaise and salad dressings. Avocado. Olive, canola, sesame, or safflower oils. The items listed above may not be a complete list of foods and beverages youcan eat. Contact a dietitian for more information. Foods to avoid Fruits Canned fruit in heavy syrup. Fruit in cream or butter sauce. Fried fruit. Vegetables Vegetables cooked in cheese, cream, or butter sauce. Fried vegetables. Grains White bread. White pasta. White rice. Cornbread. Bagels, pastries, and croissants. Crackers and snack foods that contain trans fat and hydrogenated oils. Meats and other protein foods Fatty cuts of meat. Ribs, chicken wings, bacon, sausage, bologna, salami, chitterlings, fatback, hot dogs, bratwurst, and packaged lunch meats. Liver and organ meats. Whole eggs and egg yolks. Chicken and turkey   with skin. Fried meat. Dairy Whole or 2% milk, cream, half-and-half, and cream cheese. Whole milk cheeses. Whole-fat or sweetened yogurt.  Full-fat cheeses. Nondairy creamers and whipped toppings. Processed cheese, cheese spreads, and cheese curds. Beverages Alcohol. Sugar-sweetened drinks such as sodas, lemonade, and fruit drinks. Fats and oils Butter, stick margarine, lard, shortening, ghee, or bacon fat. Coconut, palm kernel, and palm oils. Sweets and desserts Corn syrup, sugars, honey, and molasses. Candy. Jam and jelly. Syrup. Sweetened cereals. Cookies, pies, cakes, donuts, muffins, and ice cream. The items listed above may not be a complete list of foods and beverages youshould avoid. Contact a dietitian for more information. Summary Choosing the right foods helps keep your fat and cholesterol at normal levels. This can keep you from getting certain diseases. At meals, fill one-half of your plate with vegetables and green salads. Eat high-fiber foods, like whole grains, beans, apples, carrots, peas, and barley. Limit added sugar, saturated fats, alcohol, and fried foods. This information is not intended to replace advice given to you by your health care provider. Make sure you discuss any questions you have with your healthcare provider. Document Revised: 12/03/2019 Document Reviewed: 12/03/2019 Elsevier Patient Education  2022 Elsevier Inc.  

## 2021-02-16 ENCOUNTER — Other Ambulatory Visit (HOSPITAL_COMMUNITY): Payer: Self-pay | Admitting: Internal Medicine

## 2021-02-21 ENCOUNTER — Other Ambulatory Visit: Payer: Self-pay

## 2021-02-21 ENCOUNTER — Other Ambulatory Visit: Payer: PPO

## 2021-02-21 ENCOUNTER — Ambulatory Visit (INDEPENDENT_AMBULATORY_CARE_PROVIDER_SITE_OTHER): Payer: PPO | Admitting: Family Medicine

## 2021-02-21 ENCOUNTER — Encounter (INDEPENDENT_AMBULATORY_CARE_PROVIDER_SITE_OTHER): Payer: Self-pay | Admitting: Family Medicine

## 2021-02-21 VITALS — BP 120/70 | HR 73 | Temp 97.9°F | Ht 61.0 in | Wt 188.0 lb

## 2021-02-21 DIAGNOSIS — N289 Disorder of kidney and ureter, unspecified: Secondary | ICD-10-CM | POA: Diagnosis not present

## 2021-02-21 DIAGNOSIS — E65 Localized adiposity: Secondary | ICD-10-CM | POA: Diagnosis not present

## 2021-02-21 DIAGNOSIS — Z6838 Body mass index (BMI) 38.0-38.9, adult: Secondary | ICD-10-CM

## 2021-02-23 NOTE — Progress Notes (Signed)
Chief Complaint:   OBESITY [name] is here to discuss her progress with her obesity treatment plan along with follow-up of her obesity related diagnoses. [name] is on the Category 2 Plan and the Category 3 Plan and states she is following her eating plan approximately 90% of the time. [name] states she is swimming, walking, and water aerobics 90-120 minutes 4 times per week.  Today's visit was #: 22 Starting weight: 239 lbs Starting date: 12/01/2019 Today's weight: 188 lbs Today's date: 02/21/2021 Total lbs lost to date: 51 Total lbs lost since last in-office visit: 3  Interim History: [name] is off Topamax due to kidney labs/creatinine level. She has seen Rheumatology and PCP. She is in donut hole currently and paying out of pocket for Ozempic. She is not eating all protein she is supposed to. She wants to start being more mindful. Pt is still exercising.  Subjective:   1. Abnormal kidney function Recent increase in creatinine (1.94 from 1.13). [name] has recent referral to [facility].  2. Central adiposity Pt may be interested in seeing plastic surgery. She still has significant waist circumference.  Assessment/Plan:   1. Abnormal kidney function Follow up with pt after next appt.  2. Central adiposity Follow up at next appt whether pt wants referral.  3. Obesity with current BMI of 35.6  [name] is currently in the action stage of change. As such, her goal is to continue with weight loss efforts. She has agreed to the Category 2 Plan and the Category 3 Plan.   Have a protein shake at least 1x/day.  Exercise goals:  As is  Behavioral modification strategies: increasing lean protein intake, meal planning and cooking strategies, keeping healthy foods in the home, and planning for success.  [name] has agreed to follow-up with our clinic in 3 weeks. She was informed of the importance of frequent follow-up visits to maximize her success with intensive lifestyle modifications for her  multiple health conditions.   Objective:   Blood pressure 120/70, pulse 73, temperature 97.9 F (36.6 C), height 5\' 1"  (1.549 m), weight 188 lb (85.3 kg), SpO2 98 %. Body mass index is 35.52 kg/m.  General: Cooperative, alert, well developed, in no acute distress. HEENT: Conjunctivae and lids unremarkable. Cardiovascular: Regular rhythm.  Lungs: Normal work of breathing. Neurologic: No focal deficits.   Lab Results  Component Value Date   CREATININE 1.94 (H) 01/31/2021   BUN 32 (H) 01/31/2021   NA 140 01/31/2021   K 4.1 01/31/2021   CL 105 01/31/2021   CO2 17 (L) 01/31/2021   Lab Results  Component Value Date   ALT 15 01/31/2021   AST 16 01/31/2021   ALKPHOS 143 (H) 01/31/2021   BILITOT 0.2 01/31/2021   Lab Results  Component Value Date   HGBA1C 5.2 01/31/2021   HGBA1C 5.5 09/23/2020   HGBA1C 5.8 (H) 05/11/2020   HGBA1C 6.2 (H) 12/01/2019   HGBA1C 6.1 (H) 08/26/2019   Lab Results  Component Value Date   INSULIN 14.2 01/31/2021   INSULIN 25.4 (H) 09/23/2020   INSULIN 21.0 06/01/2020   INSULIN 32.3 (H) 12/01/2019   Lab Results  Component Value Date   TSH 1.550 12/01/2019   Lab Results  Component Value Date   CHOL 167 01/31/2021   HDL 39 (L) 01/31/2021   LDLCALC 86 01/31/2021   TRIG 253 (H) 01/31/2021   CHOLHDL 3.5 08/26/2019   Lab Results  Component Value Date   VD25OH 92.2 09/23/2020   VD25OH  62.9 12/01/2019   VD25OH 61.3 08/26/2019   Lab Results  Component Value Date   WBC 6.6 02/04/2021   HGB 12.7 02/04/2021   HCT 38.4 02/04/2021   MCV 86 02/04/2021   PLT 170 02/04/2021   No results found for: IRON, TIBC, FERRITIN  Attestation Statements:   Reviewed by clinician on day of visit: allergies, medications, problem list, medical history, surgical history, family history, social history, and previous encounter notes.  Time spent on visit including pre-visit chart review and post-visit care and charting was 17 minutes.   Coral Ceo,  CMA, am acting as transcriptionist for Coralie Common, MD.  I have reviewed the above documentation for accuracy and completeness, and I agree with the above. - Coralie Common, MD

## 2021-02-24 ENCOUNTER — Other Ambulatory Visit: Payer: PPO

## 2021-02-24 ENCOUNTER — Ambulatory Visit: Payer: PPO | Admitting: Rheumatology

## 2021-02-24 ENCOUNTER — Telehealth: Payer: Self-pay | Admitting: Cardiology

## 2021-02-24 ENCOUNTER — Encounter: Payer: Self-pay | Admitting: Rheumatology

## 2021-02-24 ENCOUNTER — Other Ambulatory Visit: Payer: Self-pay

## 2021-02-24 VITALS — BP 122/78 | HR 45 | Ht 61.0 in | Wt 192.8 lb

## 2021-02-24 DIAGNOSIS — Z8659 Personal history of other mental and behavioral disorders: Secondary | ICD-10-CM

## 2021-02-24 DIAGNOSIS — G8929 Other chronic pain: Secondary | ICD-10-CM

## 2021-02-24 DIAGNOSIS — M533 Sacrococcygeal disorders, not elsewhere classified: Secondary | ICD-10-CM

## 2021-02-24 DIAGNOSIS — Z96651 Presence of right artificial knee joint: Secondary | ICD-10-CM | POA: Diagnosis not present

## 2021-02-24 DIAGNOSIS — I7300 Raynaud's syndrome without gangrene: Secondary | ICD-10-CM

## 2021-02-24 DIAGNOSIS — E7849 Other hyperlipidemia: Secondary | ICD-10-CM | POA: Diagnosis not present

## 2021-02-24 DIAGNOSIS — M5136 Other intervertebral disc degeneration, lumbar region: Secondary | ICD-10-CM | POA: Diagnosis not present

## 2021-02-24 DIAGNOSIS — I10 Essential (primary) hypertension: Secondary | ICD-10-CM | POA: Diagnosis not present

## 2021-02-24 DIAGNOSIS — M1712 Unilateral primary osteoarthritis, left knee: Secondary | ICD-10-CM | POA: Diagnosis not present

## 2021-02-24 DIAGNOSIS — Z79899 Other long term (current) drug therapy: Secondary | ICD-10-CM

## 2021-02-24 DIAGNOSIS — Z8719 Personal history of other diseases of the digestive system: Secondary | ICD-10-CM

## 2021-02-24 DIAGNOSIS — Z8639 Personal history of other endocrine, nutritional and metabolic disease: Secondary | ICD-10-CM | POA: Diagnosis not present

## 2021-02-24 DIAGNOSIS — Z8669 Personal history of other diseases of the nervous system and sense organs: Secondary | ICD-10-CM

## 2021-02-24 DIAGNOSIS — M797 Fibromyalgia: Secondary | ICD-10-CM

## 2021-02-24 DIAGNOSIS — M3501 Sicca syndrome with keratoconjunctivitis: Secondary | ICD-10-CM | POA: Diagnosis not present

## 2021-02-24 DIAGNOSIS — M0579 Rheumatoid arthritis with rheumatoid factor of multiple sites without organ or systems involvement: Secondary | ICD-10-CM | POA: Diagnosis not present

## 2021-02-24 DIAGNOSIS — N289 Disorder of kidney and ureter, unspecified: Secondary | ICD-10-CM

## 2021-02-24 DIAGNOSIS — Z8679 Personal history of other diseases of the circulatory system: Secondary | ICD-10-CM

## 2021-02-24 DIAGNOSIS — M810 Age-related osteoporosis without current pathological fracture: Secondary | ICD-10-CM | POA: Diagnosis not present

## 2021-02-24 DIAGNOSIS — R001 Bradycardia, unspecified: Secondary | ICD-10-CM | POA: Diagnosis not present

## 2021-02-24 DIAGNOSIS — K200 Eosinophilic esophagitis: Secondary | ICD-10-CM

## 2021-02-24 DIAGNOSIS — N184 Chronic kidney disease, stage 4 (severe): Secondary | ICD-10-CM

## 2021-02-24 NOTE — Telephone Encounter (Signed)
STAT if HR is under 50 or over 120 (normal HR is 60-100 beats per minute)  What is your heart rate?  75 currently   Do you have a log of your heart rate readings (document readings)?  No log available, but patient states her HR has been fluctuating all day about 45-75  Do you have any other symptoms? No

## 2021-02-24 NOTE — Patient Instructions (Addendum)
Standing Labs We placed an order today for your standing lab work.   Please have your standing labs drawn in September and every 3 months  If possible, please have your labs drawn 2 weeks prior to your appointment so that the provider can discuss your results at your appointment.  Please note that you may see your imaging and lab results in [place] before we have reviewed them. We may be awaiting multiple results to interpret others before contacting you. Please allow our office up to 72 hours to thoroughly review all of the results before contacting the office for clarification of your results.  We have open lab daily: Monday through Thursday from 1:30-4:30 PM and Friday from 1:30-4:00 PM at the office of Dr. Bo Merino, [facility].   Please be advised, all patients with office appointments requiring lab work will take precedent over walk-in lab work.  If possible, please come for your lab work on Monday and Friday afternoons, as you may experience shorter wait times. The office is located at [address] [place], [place], [place] [address] No appointment is necessary.   Labs are drawn by [facility]. Please bring your co-pay at the time of your lab draw.  You may receive a bill from [facility] for your lab work.  If you wish to have your labs drawn at another location, please call the office 24 hours in advance to send orders.  If you have any questions regarding directions or hours of operation,  please call [phone].   As a reminder, please drink plenty of water prior to coming for your lab work. Thanks!  If you test POSITIVE for COVID19 and have MILD to MODERATE symptoms: First, call your PCP if you would like to receive COVID19 treatment AND Hold your medications during the infection and for at least 1 week after your symptoms have resolved: Injectable medication (Benlysta, Cimzia, Cosentyx, Enbrel, Humira, Orencia, Remicade, Simponi, Stelara, Taltz,  Tremfya) Methotrexate Leflunomide (Arava) Mycophenolate (Cellcept) Morrie Sheldon, Olumiant, or Rinvoq If you take Actemra or Kevzara, you DO NOT need to hold these for COVID19 infection.  If you test POSITIVE for COVID19 and have NO symptoms: First, call your PCP if you would like to receive COVID19 treatment AND Hold your medications for at least 10 days after the day that you tested positive Injectable medication (Benlysta, Cimzia, Cosentyx, Enbrel, Humira, Orencia, Remicade, Simponi, Stelara, Taltz, Tremfya) Methotrexate Leflunomide (Arava) Mycophenolate (Cellcept) Morrie Sheldon, Olumiant, or Rinvoq If you take Actemra or Kevzara, you DO NOT need to hold these for COVID19 infection.  If you have signs or symptoms of an infection or start antibiotics: First, call your PCP for workup of your infection. Hold your medication through the infection, until you complete your antibiotics, and until symptoms resolve if you take the following: Injectable medication (Actemra, Benlysta, Cimzia, Cosentyx, Enbrel, Humira, Kevzara, Orencia, Remicade, Simponi, Stelara, Taltz, Tremfya) Methotrexate Leflunomide (Arava) Mycophenolate (Cellcept) Morrie Sheldon, Olumiant, or Rinvoq  Heart Disease Prevention   Your inflammatory disease increases your risk of heart disease which includes heart attack, stroke, atrial fibrillation (irregular heartbeats), high blood pressure, heart failure and atherosclerosis (plaque in the arteries).  It is important to reduce your risk by:   Keep blood pressure, cholesterol, and blood sugar at healthy levels   Smoking Cessation   Maintain a healthy weight  BMI 20-25   Eat a healthy diet  Plenty of fresh fruit, vegetables, and whole grains  Limit saturated fats, foods high in sodium, and added sugars  DASH  and Mediterranean diet   Increase physical activity  Recommend moderate physically activity for 150 minutes per week/ 30 minutes a day for five days a week These can be broken up  into three separate ten-minute sessions during the day.   Reduce Stress  Meditation, slow breathing exercises, yoga, coloring books  Dental visits twice a year   Vaccines You are taking a medication(s) that can suppress your immune system.  The following immunizations are recommended: Flu annually Covid-19  Td/Tdap (tetanus, diphtheria, pertussis) every 10 years Pneumonia (Prevnar 15 then Pneumovax 23 at least 1 year apart.  Alternatively, can take Prevnar 20 without needing additional dose) Shingrix (after age 43): 2 doses from 4 weeks to 6 months apart  Please check with your PCP to make sure you are up to date.

## 2021-02-24 NOTE — Telephone Encounter (Signed)
Returned call to pt.  Per Pt she was advised to call office by Rheumatology office d/t HR of 45.  Per Pt HR was obtained with pulse ox.  Pt's apical or radial pulse was not checked to confirm heart rate.  Pt has history of PVC's.  Pt understands that pulse ox may not be accurate if she is having PVC's when pulse ox is used.   Per Pt she is not having any symptoms of shortness of breath, dizziness, presyncope, fatigue.  Pt states she has noticed her heart rate will dip in the 40's per her apple watch but it comes right back up.    Pt did not express concern with HR.    Pt will continue to monitor HR and call office if she has any symptoms that correlate with slow heart rate.  Pt has cardiology appt scheduled for August.  No action needed at this time.

## 2021-02-25 ENCOUNTER — Encounter: Payer: Self-pay | Admitting: Nurse Practitioner

## 2021-02-25 LAB — BASIC METABOLIC PANEL
BUN/Creatinine Ratio: 17 (ref 12–28)
BUN: 25 mg/dL (ref 8–27)
CO2: 22 mmol/L (ref 20–29)
Calcium: 9.7 mg/dL (ref 8.7–10.3)
Chloride: 103 mmol/L (ref 96–106)
Creatinine, Ser: 1.43 mg/dL — ABNORMAL HIGH (ref 0.57–1.00)
Glucose: 85 mg/dL (ref 65–99)
Potassium: 4.6 mmol/L (ref 3.5–5.2)
Sodium: 139 mmol/L (ref 134–144)
eGFR: 40 mL/min/{1.73_m2} — ABNORMAL LOW (ref >59)

## 2021-02-25 NOTE — Progress Notes (Signed)
Overall, these kidney functions are improving in short period of time. Patient does have referral to nephrology that was made per rheumatologist on 6/28

## 2021-03-15 ENCOUNTER — Ambulatory Visit (INDEPENDENT_AMBULATORY_CARE_PROVIDER_SITE_OTHER): Payer: PPO | Admitting: Family Medicine

## 2021-03-15 ENCOUNTER — Other Ambulatory Visit: Payer: Self-pay | Admitting: Physician Assistant

## 2021-03-15 ENCOUNTER — Encounter (INDEPENDENT_AMBULATORY_CARE_PROVIDER_SITE_OTHER): Payer: Self-pay | Admitting: Family Medicine

## 2021-03-15 ENCOUNTER — Other Ambulatory Visit: Payer: Self-pay

## 2021-03-15 VITALS — BP 122/77 | HR 75 | Temp 98.4°F | Ht 61.0 in | Wt 192.0 lb

## 2021-03-15 DIAGNOSIS — Z6841 Body Mass Index (BMI) 40.0 and over, adult: Secondary | ICD-10-CM

## 2021-03-15 DIAGNOSIS — E781 Pure hyperglyceridemia: Secondary | ICD-10-CM

## 2021-03-15 DIAGNOSIS — R7309 Other abnormal glucose: Secondary | ICD-10-CM | POA: Diagnosis not present

## 2021-03-15 MED ORDER — OZEMPIC (0.25 OR 0.5 MG/DOSE) 2 MG/1.5ML ~~LOC~~ SOPN: 0.5000 mg | PEN_INJECTOR | SUBCUTANEOUS | 0 refills | Status: DC

## 2021-03-15 NOTE — Telephone Encounter (Signed)
Next Visit: 05/26/2021  Last Visit: 02/24/2021  Last Fill: 11/04/2020  DX:  Rheumatoid arthritis involving multiple sites with positive rheumatoid factor  Current Dose per office note 02/24/2021: Arava 20 mg daily  Labs: 02/04/2021,  CBC is normal, BMP 02/24/2021, Creatinine 1.43, eGFR 40,   Okay to refill Arava?

## 2021-03-16 NOTE — Progress Notes (Signed)
Chief Complaint:   OBESITY [name] is here to discuss her progress with her obesity treatment plan along with follow-up of her obesity related diagnoses. [name] is on the Category 2 Plan and states she is following her eating plan approximately 75% of the time. [name] states she is water walking 60 minutes 2-3 times per week.  Today's visit was #: 23 Starting weight: 239 lbs Starting date: 12/01/2019 Today's weight: 192 lbs Today's date: 03/15/2021 Total lbs lost to date: 47 Total lbs lost since last in-office visit: 0  Interim History: [name] is noticing more of an appetite recently. She is wondering if there is an increase in water weight. She feels very discouraged with particularly stomach area. At night, she is craving tomato sandwiches. She is getting close to 6 oz meat at dinner.  Subjective:   1. Elevated random blood glucose level [name] is on Ozempic 0.5 mg. She likes the feeling of satiety. She denies GI side effects.  2. Hypertriglyceridemia [name]'s last triglycerides 253, HDL 39, and LDL 86. She is not on statin therapy.  Assessment/Plan:   1. Elevated random blood glucose level We will refill Ozempic 0.5 mg, as prescribed below.  Refill- Semaglutide,0.25 or 0.5MG /DOS, (OZEMPIC, 0.25 OR 0.5 MG/DOSE,) 2 MG/1.5ML SOPN; Inject 0.5 mg into the skin every 7 (seven) days.  Dispense: 1.5 mL; Refill: 0  2. Hypertriglyceridemia Repeat labs in 3 months. Cardiovascular risk and specific lipid/LDL goals reviewed.  We discussed several lifestyle modifications today and [name] will continue to work on diet, exercise and weight loss efforts. Orders and follow up as documented in patient record.   Counseling Intensive lifestyle modifications are the first line treatment for this issue. Dietary changes: Increase soluble fiber. Decrease simple carbohydrates. Exercise changes: Moderate to vigorous-intensity aerobic activity 150 minutes per week if tolerated. Lipid-lowering medications: see  documented in medical record.  3. Obesity with current BMI of 36.4  [name] is currently in the action stage of change. As such, her goal is to continue with weight loss efforts. She has agreed to the Category 2 Plan.   Exercise goals:  As is- Add 10-15 minutes of resistance training 2-3 times a week.  Behavioral modification strategies: increasing lean protein intake, meal planning and cooking strategies, and keeping healthy foods in the home.  [name] has agreed to follow-up with our clinic in 4 weeks. She was informed of the importance of frequent follow-up visits to maximize her success with intensive lifestyle modifications for her multiple health conditions.   Objective:   Blood pressure 122/77, pulse 75, temperature 98.4 F (36.9 C), height 5\' 1"  (1.549 m), weight 192 lb (87.1 kg), SpO2 97 %. Body mass index is 36.28 kg/m.  General: Cooperative, alert, well developed, in no acute distress. HEENT: Conjunctivae and lids unremarkable. Cardiovascular: Regular rhythm.  Lungs: Normal work of breathing. Neurologic: No focal deficits.   Lab Results  Component Value Date   CREATININE 1.43 (H) 02/24/2021   BUN 25 02/24/2021   NA 139 02/24/2021   K 4.6 02/24/2021   CL 103 02/24/2021   CO2 22 02/24/2021   Lab Results  Component Value Date   ALT 15 01/31/2021   AST 16 01/31/2021   ALKPHOS 143 (H) 01/31/2021   BILITOT 0.2 01/31/2021   Lab Results  Component Value Date   HGBA1C 5.2 01/31/2021   HGBA1C 5.5 09/23/2020   HGBA1C 5.8 (H) 05/11/2020   HGBA1C 6.2 (H) 12/01/2019   HGBA1C 6.1 (H) 08/26/2019   Lab Results  Component Value Date   INSULIN 14.2 01/31/2021   INSULIN 25.4 (H) 09/23/2020   INSULIN 21.0 06/01/2020   INSULIN 32.3 (H) 12/01/2019   Lab Results  Component Value Date   TSH 1.550 12/01/2019   Lab Results  Component Value Date   CHOL 167 01/31/2021   HDL 39 (L) 01/31/2021   LDLCALC 86 01/31/2021   TRIG 253 (H) 01/31/2021   CHOLHDL 3.5 08/26/2019   Lab  Results  Component Value Date   VD25OH 92.2 09/23/2020   VD25OH 62.9 12/01/2019   VD25OH 61.3 08/26/2019   Lab Results  Component Value Date   WBC 6.6 02/04/2021   HGB 12.7 02/04/2021   HCT 38.4 02/04/2021   MCV 86 02/04/2021   PLT 170 02/04/2021   No results found for: IRON, TIBC, FERRITIN  Obesity Behavioral Intervention:   Approximately 15 minutes were spent on the discussion below.  ASK: We discussed the diagnosis of obesity with [name] today and [name] agreed to give [place] permission to discuss obesity behavioral modification therapy today.  ASSESS: [name] has the diagnosis of obesity and her BMI today is 36.4. [name] is in the action stage of change.   ADVISE: [name] was educated on the multiple health risks of obesity as well as the benefit of weight loss to improve her health. She was advised of the need for long term treatment and the importance of lifestyle modifications to improve her current health and to decrease her risk of future health problems.  AGREE: Multiple dietary modification options and treatment options were discussed and [name] agreed to follow the recommendations documented in the above note.  ARRANGE: [name] was educated on the importance of frequent visits to treat obesity as outlined per CMS and USPSTF guidelines and agreed to schedule her next follow up appointment today.  Attestation Statements:   Reviewed by clinician on day of visit: allergies, medications, problem list, medical history, surgical history, family history, social history, and previous encounter notes.  Coral Ceo, CMA, am acting as transcriptionist for Coralie Common, MD.  I have reviewed the above documentation for accuracy and completeness, and I agree with the above. - Coralie Common, MD

## 2021-03-23 DIAGNOSIS — K118 Other diseases of salivary glands: Secondary | ICD-10-CM | POA: Diagnosis not present

## 2021-04-11 ENCOUNTER — Other Ambulatory Visit (INDEPENDENT_AMBULATORY_CARE_PROVIDER_SITE_OTHER): Payer: Self-pay | Admitting: Family Medicine

## 2021-04-11 ENCOUNTER — Other Ambulatory Visit: Payer: Self-pay | Admitting: Rheumatology

## 2021-04-11 DIAGNOSIS — R7309 Other abnormal glucose: Secondary | ICD-10-CM

## 2021-04-11 NOTE — Telephone Encounter (Signed)
Next Visit: 05/26/2021  Last Visit: 02/24/2021  Last Fill:01/12/2021  Current Dose per office note on 02/24/2021: not discussed  Okay to refill Promethazine?

## 2021-04-12 ENCOUNTER — Ambulatory Visit: Payer: PPO | Admitting: Cardiology

## 2021-04-13 ENCOUNTER — Ambulatory Visit (INDEPENDENT_AMBULATORY_CARE_PROVIDER_SITE_OTHER): Payer: PPO | Admitting: Family Medicine

## 2021-04-13 ENCOUNTER — Encounter (INDEPENDENT_AMBULATORY_CARE_PROVIDER_SITE_OTHER): Payer: Self-pay | Admitting: Family Medicine

## 2021-04-13 ENCOUNTER — Other Ambulatory Visit: Payer: Self-pay

## 2021-04-13 VITALS — BP 110/61 | HR 74 | Temp 98.2°F | Ht 61.0 in | Wt 190.0 lb

## 2021-04-13 DIAGNOSIS — R739 Hyperglycemia, unspecified: Secondary | ICD-10-CM

## 2021-04-13 DIAGNOSIS — E1169 Type 2 diabetes mellitus with other specified complication: Secondary | ICD-10-CM

## 2021-04-13 DIAGNOSIS — E785 Hyperlipidemia, unspecified: Secondary | ICD-10-CM

## 2021-04-13 DIAGNOSIS — R7309 Other abnormal glucose: Secondary | ICD-10-CM

## 2021-04-13 DIAGNOSIS — Z6841 Body Mass Index (BMI) 40.0 and over, adult: Secondary | ICD-10-CM

## 2021-04-13 DIAGNOSIS — E66813 Obesity, class 3: Secondary | ICD-10-CM

## 2021-04-13 MED ORDER — SEMAGLUTIDE (1 MG/DOSE) 4 MG/3ML ~~LOC~~ SOPN: 1.0000 mg | PEN_INJECTOR | SUBCUTANEOUS | 0 refills | Status: DC

## 2021-04-13 NOTE — Progress Notes (Signed)
Chief Complaint:   OBESITY [name] is here to discuss her progress with her obesity treatment plan along with follow-up of her obesity related diagnoses. [name] is on the Category 2 Plan and states she is following her eating plan approximately 50% of the time. [name] states she is walking and swimming 2-3 hours 7 times per week.  Today's visit was #: 24 Starting weight: 239 lbs Starting date: 12/01/2019 Today's weight: 190 lbs Today's date: 04/13/2021 Total lbs lost to date: 49 Total lbs lost since last in-office visit: 2  Interim History: [name] went to the beach for 12 days and exercised quite a bit. She was not as on meal plan as she has been previously. She has her 50th high school reunion in the next month. Pt acknowledges obstacle in the next few weeks is eating out with family.  Subjective:   1. Elevated random blood glucose level  [name] is on Ozempic 0.5 mg with some satiety.  2. Hyperlipidemia associated with type 2 diabetes mellitus ([place]) She is not on statin therapy. Pt's last LDL was 86, HDL 39, and triglycerides 253.   Assessment/Plan:   1. Elevated random blood glucose level Increase Ozempic to 1 mg weekly.  Refill- Semaglutide, 1 MG/DOSE, 4 MG/3ML SOPN; Inject 1 mg as directed once a week.  Dispense: 3 mL; Refill: 0  2. Hyperlipidemia associated with type 2 diabetes mellitus ([place]) Cardiovascular risk and specific lipid/LDL goals reviewed.  We discussed several lifestyle modifications today and [name] will continue to work on diet, exercise and weight loss efforts. Orders and follow up as documented in patient record. Follow up labs in November.  Counseling Intensive lifestyle modifications are the first line treatment for this issue. Dietary changes: Increase soluble fiber. Decrease simple carbohydrates. Exercise changes: Moderate to vigorous-intensity aerobic activity 150 minutes per week if tolerated. Lipid-lowering medications: see documented in medical record.  3.  Obesity with current BMI of 35.9  [name] is currently in the action stage of change. As such, her goal is to continue with weight loss efforts. She has agreed to the Category 2 Plan.   Exercise goals: All adults should avoid inactivity. Some physical activity is better than none, and adults who participate in any amount of physical activity gain some health benefits.  Behavioral modification strategies: increasing lean protein intake, meal planning and cooking strategies, keeping healthy foods in the home, and planning for success.  [name] has agreed to follow-up with our clinic in 3-4 weeks. She was informed of the importance of frequent follow-up visits to maximize her success with intensive lifestyle modifications for her multiple health conditions.   Objective:   Blood pressure 110/61, pulse 74, temperature 98.2 F (36.8 C), height 5\' 1"  (1.549 m), weight 190 lb (86.2 kg), SpO2 99 %. Body mass index is 35.9 kg/m.  General: Cooperative, alert, well developed, in no acute distress. HEENT: Conjunctivae and lids unremarkable. Cardiovascular: Regular rhythm.  Lungs: Normal work of breathing. Neurologic: No focal deficits.   Lab Results  Component Value Date   CREATININE 1.43 (H) 02/24/2021   BUN 25 02/24/2021   NA 139 02/24/2021   K 4.6 02/24/2021   CL 103 02/24/2021   CO2 22 02/24/2021   Lab Results  Component Value Date   ALT 15 01/31/2021   AST 16 01/31/2021   ALKPHOS 143 (H) 01/31/2021   BILITOT 0.2 01/31/2021   Lab Results  Component Value Date   HGBA1C 5.2 01/31/2021   HGBA1C 5.5 09/23/2020  HGBA1C 5.8 (H) 05/11/2020   HGBA1C 6.2 (H) 12/01/2019   HGBA1C 6.1 (H) 08/26/2019   Lab Results  Component Value Date   INSULIN 14.2 01/31/2021   INSULIN 25.4 (H) 09/23/2020   INSULIN 21.0 06/01/2020   INSULIN 32.3 (H) 12/01/2019   Lab Results  Component Value Date   TSH 1.550 12/01/2019   Lab Results  Component Value Date   CHOL 167 01/31/2021   HDL 39 (L)  01/31/2021   LDLCALC 86 01/31/2021   TRIG 253 (H) 01/31/2021   CHOLHDL 3.5 08/26/2019   Lab Results  Component Value Date   VD25OH 92.2 09/23/2020   VD25OH 62.9 12/01/2019   VD25OH 61.3 08/26/2019   Lab Results  Component Value Date   WBC 6.6 02/04/2021   HGB 12.7 02/04/2021   HCT 38.4 02/04/2021   MCV 86 02/04/2021   PLT 170 02/04/2021   No results found for: IRON, TIBC, FERRITIN  Obesity Behavioral Intervention:   Approximately 15 minutes were spent on the discussion below.  ASK: We discussed the diagnosis of obesity with [name] today and [name] agreed to give [place] permission to discuss obesity behavioral modification therapy today.  ASSESS: [name] has the diagnosis of obesity and her BMI today is 35.9. [name] is in the action stage of change.   ADVISE: [name] was educated on the multiple health risks of obesity as well as the benefit of weight loss to improve her health. She was advised of the need for long term treatment and the importance of lifestyle modifications to improve her current health and to decrease her risk of future health problems.  AGREE: Multiple dietary modification options and treatment options were discussed and [name] agreed to follow the recommendations documented in the above note.  ARRANGE: Maxima was educated on the importance of frequent visits to treat obesity as outlined per CMS and USPSTF guidelines and agreed to schedule her next follow up appointment today.  Attestation Statements:   Reviewed by clinician on day of visit: allergies, medications, problem list, medical history, surgical history, family history, social history, and previous encounter notes.  Coral Ceo, CMA, am acting as transcriptionist for Marisa Common, MD.   I have reviewed the above documentation for accuracy and completeness, and I agree with the above. - Marisa Common, MD

## 2021-04-14 DIAGNOSIS — E876 Hypokalemia: Secondary | ICD-10-CM | POA: Diagnosis not present

## 2021-04-14 DIAGNOSIS — N1831 Chronic kidney disease, stage 3a: Secondary | ICD-10-CM | POA: Diagnosis not present

## 2021-04-14 DIAGNOSIS — I5032 Chronic diastolic (congestive) heart failure: Secondary | ICD-10-CM | POA: Diagnosis not present

## 2021-04-22 ENCOUNTER — Telehealth: Payer: Self-pay | Admitting: *Deleted

## 2021-04-22 NOTE — Telephone Encounter (Signed)
Labs received from:[facility]  Drawn on:04/14/2021  Reviewed by:Hazel Sams, PA-C  Labs drawn:Renal Function Panel  Results:Creatinine 1.59, GFR 35

## 2021-05-04 ENCOUNTER — Ambulatory Visit (INDEPENDENT_AMBULATORY_CARE_PROVIDER_SITE_OTHER): Payer: PPO | Admitting: Family Medicine

## 2021-05-04 ENCOUNTER — Other Ambulatory Visit: Payer: Self-pay

## 2021-05-04 ENCOUNTER — Encounter (INDEPENDENT_AMBULATORY_CARE_PROVIDER_SITE_OTHER): Payer: Self-pay | Admitting: Family Medicine

## 2021-05-04 VITALS — BP 122/76 | HR 73 | Temp 98.3°F | Ht 61.0 in | Wt 186.0 lb

## 2021-05-04 DIAGNOSIS — Z6841 Body Mass Index (BMI) 40.0 and over, adult: Secondary | ICD-10-CM

## 2021-05-04 DIAGNOSIS — J3089 Other allergic rhinitis: Secondary | ICD-10-CM

## 2021-05-04 DIAGNOSIS — R7309 Other abnormal glucose: Secondary | ICD-10-CM

## 2021-05-04 DIAGNOSIS — I5030 Unspecified diastolic (congestive) heart failure: Secondary | ICD-10-CM | POA: Diagnosis not present

## 2021-05-04 MED ORDER — POTASSIUM CHLORIDE ER 10 MEQ PO TBCR: 20.0000 meq | EXTENDED_RELEASE_TABLET | Freq: Every day | ORAL | 3 refills | Status: AC

## 2021-05-04 MED ORDER — SEMAGLUTIDE (1 MG/DOSE) 4 MG/3ML ~~LOC~~ SOPN: 1.0000 mg | PEN_INJECTOR | SUBCUTANEOUS | 0 refills | Status: DC

## 2021-05-04 MED ORDER — LEVOCETIRIZINE DIHYDROCHLORIDE 5 MG PO TABS: 5.0000 mg | ORAL_TABLET | Freq: Every evening | ORAL | 0 refills | Status: DC

## 2021-05-04 NOTE — Progress Notes (Signed)
Chief Complaint:   OBESITY [name] is here to discuss her progress with her obesity treatment plan along with follow-up of her obesity related diagnoses. Dorinne is on the Category 2 Plan and states she is following her eating plan approximately 75% of the time. Candelaria states she is walking 1 mile 7 times per week.  Today's visit was #: 25 Starting weight: 239 lbs Starting date: 12/01/2019 Today's weight: 186 lbs Today's date: 05/04/2021 Total lbs lost to date: 53 Total lbs lost since last in-office visit: 4  Interim History: [name] is going to her 50th high school reunion this weekend and she is staying in downtown [place] for the weekend. She has been spending lots of time with her grandkids and kids, who live overseas and will be moving back there mid November.  Subjective:   1. Allergic rhinitis due to other allergic trigger, unspecified seasonality Bedie is on Xyzal with good control of symptoms.  2. Diastolic congestive heart failure, unspecified HF chronicity ([place]) She takes torsemide. Pt's last potassium level was within normal limits at 4.6.  3. Elevated random blood glucose level Pt is on Ozempic with good control of food intake. She denies Gi side effects.  Assessment/Plan:   1. Allergic rhinitis due to other allergic trigger, unspecified seasonality Continue current treatment plan.  Refill- levocetirizine (XYZAL) 5 MG tablet; Take 1 tablet (5 mg total) by mouth every evening.  Dispense: 90 tablet; Refill: 0  2. Diastolic congestive heart failure, unspecified HF chronicity ([place]) Continue current treatment plan.  Refill- potassium chloride (KLOR-CON) 10 MEQ tablet; Take 2 tablets (20 mEq total) by mouth daily.  Dispense: 180 tablet; Refill: 3  3. Elevated random blood glucose level Continue Ozempic 1 mg.  Refill- Semaglutide, 1 MG/DOSE, 4 MG/3ML SOPN; Inject 1 mg as directed once a week.  Dispense: 3 mL; Refill: 0  4. Obesity with current BMI of 35.3  [name] is  currently in the action stage of change. As such, her goal is to continue with weight loss efforts. She has agreed to the Category 2 Plan.   Exercise goals:  As is  Behavioral modification strategies: increasing lean protein intake, meal planning and cooking strategies, and keeping healthy foods in the home.  [name] has agreed to follow-up with our clinic in 3-4 weeks. She was informed of the importance of frequent follow-up visits to maximize her success with intensive lifestyle modifications for her multiple health conditions.   Objective:   Blood pressure 122/76, pulse 73, temperature 98.3 F (36.8 C), height 5\' 1"  (1.549 m), weight 186 lb (84.4 kg), SpO2 97 %. Body mass index is 35.14 kg/m.  General: Cooperative, alert, well developed, in no acute distress. HEENT: Conjunctivae and lids unremarkable. Cardiovascular: Regular rhythm.  Lungs: Normal work of breathing. Neurologic: No focal deficits.   Lab Results  Component Value Date   CREATININE 1.43 (H) 02/24/2021   BUN 25 02/24/2021   NA 139 02/24/2021   K 4.6 02/24/2021   CL 103 02/24/2021   CO2 22 02/24/2021   Lab Results  Component Value Date   ALT 15 01/31/2021   AST 16 01/31/2021   ALKPHOS 143 (H) 01/31/2021   BILITOT 0.2 01/31/2021   Lab Results  Component Value Date   HGBA1C 5.2 01/31/2021   HGBA1C 5.5 09/23/2020   HGBA1C 5.8 (H) 05/11/2020   HGBA1C 6.2 (H) 12/01/2019   HGBA1C 6.1 (H) 08/26/2019   Lab Results  Component Value Date   INSULIN 14.2 01/31/2021  INSULIN 25.4 (H) 09/23/2020   INSULIN 21.0 06/01/2020   INSULIN 32.3 (H) 12/01/2019   Lab Results  Component Value Date   TSH 1.550 12/01/2019   Lab Results  Component Value Date   CHOL 167 01/31/2021   HDL 39 (L) 01/31/2021   LDLCALC 86 01/31/2021   TRIG 253 (H) 01/31/2021   CHOLHDL 3.5 08/26/2019   Lab Results  Component Value Date   VD25OH 92.2 09/23/2020   VD25OH 62.9 12/01/2019   VD25OH 61.3 08/26/2019   Lab Results  Component  Value Date   WBC 6.6 02/04/2021   HGB 12.7 02/04/2021   HCT 38.4 02/04/2021   MCV 86 02/04/2021   PLT 170 02/04/2021   No results found for: IRON, TIBC, FERRITIN  Obesity Behavioral Intervention:   Approximately 15 minutes were spent on the discussion below.  ASK: We discussed the diagnosis of obesity with [name] today and Anthonia agreed to give [name] permission to discuss obesity behavioral modification therapy today.  ASSESS: [name] has the diagnosis of obesity and her BMI today is 35.3. [name] is in the action stage of change.   ADVISE: [name] was educated on the multiple health risks of obesity as well as the benefit of weight loss to improve her health. She was advised of the need for long term treatment and the importance of lifestyle modifications to improve her current health and to decrease her risk of future health problems.  AGREE: Multiple dietary modification options and treatment options were discussed and [name] agreed to follow the recommendations documented in the above note.  ARRANGE: [name] was educated on the importance of frequent visits to treat obesity as outlined per CMS and USPSTF guidelines and agreed to schedule her next follow up appointment today.  Attestation Statements:   Reviewed by clinician on day of visit: allergies, medications, problem list, medical history, surgical history, family history, social history, and previous encounter notes.  Coral Ceo, CMA, am acting as transcriptionist for Coralie Common, MD.   I have reviewed the above documentation for accuracy and completeness, and I agree with the above. - Coralie Common, MD

## 2021-05-09 ENCOUNTER — Ambulatory Visit: Payer: PPO | Admitting: Physician Assistant

## 2021-05-10 ENCOUNTER — Other Ambulatory Visit: Payer: Self-pay | Admitting: Cardiology

## 2021-05-10 ENCOUNTER — Other Ambulatory Visit: Payer: Self-pay | Admitting: Rheumatology

## 2021-05-10 ENCOUNTER — Other Ambulatory Visit: Payer: Self-pay

## 2021-05-10 ENCOUNTER — Encounter: Payer: Self-pay | Admitting: Physician Assistant

## 2021-05-10 ENCOUNTER — Ambulatory Visit (INDEPENDENT_AMBULATORY_CARE_PROVIDER_SITE_OTHER): Payer: PPO | Admitting: Physician Assistant

## 2021-05-10 VITALS — BP 93/62 | HR 72 | Temp 98.9°F | Ht 61.0 in | Wt 191.0 lb

## 2021-05-10 DIAGNOSIS — M0579 Rheumatoid arthritis with rheumatoid factor of multiple sites without organ or systems involvement: Secondary | ICD-10-CM

## 2021-05-10 DIAGNOSIS — E782 Mixed hyperlipidemia: Secondary | ICD-10-CM | POA: Diagnosis not present

## 2021-05-10 DIAGNOSIS — I10 Essential (primary) hypertension: Secondary | ICD-10-CM

## 2021-05-10 DIAGNOSIS — N1832 Chronic kidney disease, stage 3b: Secondary | ICD-10-CM | POA: Diagnosis not present

## 2021-05-10 DIAGNOSIS — R7303 Prediabetes: Secondary | ICD-10-CM | POA: Diagnosis not present

## 2021-05-10 DIAGNOSIS — I5030 Unspecified diastolic (congestive) heart failure: Secondary | ICD-10-CM

## 2021-05-10 DIAGNOSIS — I5032 Chronic diastolic (congestive) heart failure: Secondary | ICD-10-CM

## 2021-05-10 DIAGNOSIS — M3501 Sicca syndrome with keratoconjunctivitis: Secondary | ICD-10-CM

## 2021-05-10 NOTE — Telephone Encounter (Signed)
Please clarify if the patient would like a 90-day supply-120 tablets.

## 2021-05-10 NOTE — Telephone Encounter (Signed)
Next Visit: 05/26/2021  Last Visit: 02/24/2021  Labs: 04/14/2021 Renal Function Panel Creatinine 1.59, GFR 35, 02/04/2021 CBC WNL  Eye exam: 02/08/2021 WNL   Current Dose per office note 02/24/2021: Plaquenil 200 mg 1 tablet by mouth daily Monday through Friday  AJ:[facility]IN[facility] arthritis involving multiple sites with positive rheumatoid factor  Last Fill: 02/07/2021  Okay to refill Plaquenil?

## 2021-05-10 NOTE — Progress Notes (Signed)
New Patient Office Visit  Subjective:  Patient ID: [name], female    DOB: 1950/09/08  Age: 70 y.o. MRN: [id number]  CC:  Chief Complaint  Patient presents with   Follow-up    HPI [name] presents for follow up on prediabetes and weight. Patient is followed by [facility] for weight loss. Patient has been struggling with her kidney function and has established with [facility]. States her medications have been adjusted to help preserve her kidney function. Continues to take torsemide for CHF. Denies shortness of breath, chest pain or palpitations. Patient was tapered off Topamax to improve kidney function and states has been doing ok without medication.    Past Medical History:  Diagnosis Date   Arthritis    Rheumatoid arthritis,    Back pain    Blood transfusion    1981   CHF (congestive heart failure) (HCC)    Chronic diastolic CHF (congestive heart failure) (HCC)    diastolic    DDD (degenerative disc disease), cervical    DDD (degenerative disc disease), lumbar    Edema of both lower extremities    Fibromyalgia    Gallbladder problem    GERD (gastroesophageal reflux disease)    Heart murmur    as a child   Hiatal hernia    sjorgens syndrome   High blood pressure    High cholesterol    IBS (irritable bowel syndrome)    Joint pain    Numbness and tingling of both feet    Numbness of fingers    OSA (obstructive sleep apnea)    Osteoarthritis    Peripheral autonomic neuropathy of unknown cause    Pneumonia 07/2018   Polyneuropathy    Pre-diabetes    PVC (premature ventricular contraction)    Raynaud disease    Rheumatoid arthritis ([name])    Sjogren's disease ([place])    Sleep apnea    SOB (shortness of breath)    Swallowing difficulty    Urticaria    Vitamin D deficiency     Past Surgical History:  Procedure Laterality Date   ABDOMINAL HYSTERECTOMY     BTL, BSO   ADENOIDECTOMY     BREAST SURGERY     mass  removal    CHOLECYSTECTOMY     dental implants     DIAGNOSTIC LAPAROSCOPY     x3   KNEE ARTHROSCOPY     x2   MASS EXCISION Left 03/22/2017   Procedure: EXCISION OF LEFT BREAST MASS;  Surgeon: Donnie Mesa, MD;  Location: [facility];  Service: General;  Laterality: Left;   patotid cystectomy     RIGHT/LEFT HEART CATH AND CORONARY ANGIOGRAPHY N/A 07/25/2019   Procedure: RIGHT/LEFT HEART CATH AND CORONARY ANGIOGRAPHY;  Surgeon: Jolaine Artist, MD;  Location: [facility];  Service: Cardiovascular;  Laterality: N/A;   TENDON REPAIR  1980   left ankle and tibia   TONSILLECTOMY     TOTAL KNEE ARTHROPLASTY  06/21/2011   Procedure: TOTAL KNEE ARTHROPLASTY;  Surgeon: Gearlean Alf;  Location: [facility];  Service: Orthopedics;  Laterality: Right;   TUBAL LIGATION      Family History  Problem Relation Age of Onset   Alzheimer's disease Mother    Heart attack Mother    Hypertension Mother    Glaucoma Mother    High Cholesterol Mother    Heart disease Mother    Depression Mother    Cancer Father  High Cholesterol Father    Heart disease Father    Sleep apnea Father    Heart attack Brother    Heart disease Brother    Glaucoma Brother    Hyperlipidemia Brother    Glaucoma Brother    Asthma Son    Allergic rhinitis Neg Hx    Angioedema Neg Hx    Eczema Neg Hx    Urticaria Neg Hx     Social History   Socioeconomic History   Marital status: Widowed    Spouse name: Not on file   Number of children: 2   Years of education: AS   Highest education level: Not on file  Occupational History   Occupation: retired Marine scientist   Occupation: Therapist, sports  Tobacco Use   Smoking status: Never   Smokeless tobacco: Never  Vaping Use   Vaping Use: Never used  Substance and Sexual Activity   Alcohol use: Yes    Comment: socially    Drug use: No   Sexual activity: Never  Other Topics Concern   Not on file  Social History Narrative   Lives at home alone   Right-handed   Drinks 1 or less cups  of coffee and 2 servings of either tea or soda per day   Social Determinants of Health   Financial Resource Strain: Not on file  Food Insecurity: Not on file  Transportation Needs: Not on file  Physical Activity: Not on file  Stress: Not on file  Social Connections: Not on file  Intimate Partner Violence: Not on file    ROS Review of Systems Review of Systems:  A fourteen system review of systems was performed and found to be positive as per HPI.  Objective:   Today's Vitals: BP 93/62   Pulse 72   Temp 98.9 F (37.2 C)   Ht 5\' 1"  (1.549 m)   Wt 191 lb (86.6 kg)   SpO2 98%   BMI 36.09 kg/m   Physical Exam General:  Well Developed, well nourished, appropriate for stated age.  Neuro:  Alert and oriented,  extra-ocular muscles intact  HEENT:  Normocephalic, atraumatic, neck supple Skin:  no gross rash, warm, pink. Cardiac:  RRR, S1 S2 Respiratory:  CTA B/L, Not using accessory muscles, speaking in full sentences- unlabored. Vascular:  Ext warm, no cyanosis apprec.; cap RF less 2 sec. Psych:  No HI/SI, judgement and insight good, Euthymic mood. Full Affect.  Assessment & Plan:   Problem List Items Addressed This Visit       Other   Mixed hyperlipidemia   Prediabetes   Other Visit Diagnoses     Diastolic congestive heart failure, unspecified HF chronicity ([place])    -  Primary   Stage 3b chronic kidney disease ([place])          Diastolic congestive heart failure, unspecified HF chronicity: -Followed by Cardiology. -Echocardiogram 06/24/2019: LVEF 60-65% -On diuretic therapy, monitored by cardiology and nephrology.  Mixed hyperlipidemia: -Last lipid panel: total cholesterol 167, triglycerides 253, HDL 39, LDL 86. -Patient was previously on atorvastatin 20 mg. Reviewed med history, atorvastatin 20 mg discontinued 02/21/2021 ([facility]). -Recommend to follow a low fat diet and monitor simple carbohydrates.  Prediabetes: -Last A1c 5.2, controlled. -Continue with weight  loss efforts and monitor carbohydrates/glucose intake.  Stage 3b CKD: -Followed by [facility]. -Reviewed consult 04/14/2021. -Avoid nephrotoxic substances.     Outpatient Encounter Medications as of 05/10/2021  Medication Sig   albuterol (VENTOLIN HFA) 108 (90 Base) MCG/ACT inhaler Inhale 2  puffs into the lungs every 4 (four) hours as needed for wheezing or shortness of breath. (Patient not taking: Reported on 05/10/2021)   aspirin EC 81 MG tablet Take 81 mg by mouth at bedtime.   cholestyramine (QUESTRAN) 4 GM/DOSE powder Take by mouth 3 (three) times daily with meals.   cycloSPORINE (RESTASIS) 0.05 % ophthalmic emulsion Place 2 drops into both eyes 2 (two) times daily.   diphenoxylate-atropine (LOMOTIL) 2.5-0.025 MG per tablet Take 1 tablet by mouth 4 (four) times daily as needed for diarrhea or loose stools.   doxylamine, Sleep, (UNISOM) 25 MG tablet Take 1 tablet (25 mg total) by mouth at bedtime as needed. (Patient taking differently: Take 25 mg by mouth at bedtime.)   fluticasone (FLONASE) 50 MCG/ACT nasal spray Place 1 spray into both nostrils daily.   gabapentin (NEURONTIN) 300 MG capsule Take 2 capsules (600 mg total) by mouth 2 (two) times daily.   hydroxychloroquine (PLAQUENIL) 200 MG tablet TAKE 1 TABLET BY MOUTH DAILY MONDAY THROUGH FRIDAY.   hyoscyamine (ANASPAZ) 0.125 MG TBDP disintergrating tablet Place 0.25 mg under the tongue every 6 (six) hours as needed for cramping.    leflunomide (ARAVA) 20 MG tablet TAKE 1 TABLET BY MOUTH DAILY.   levocetirizine (XYZAL) 5 MG tablet Take 1 tablet (5 mg total) by mouth every evening.   lidocaine (LIDODERM) 5 % PLACE 1 PATCH ONTO THE SKIN DAILY AS NEEDED FOR PAIN. REMOVE AND DISCARD PATCH WITHIN 12 HOURS OR AS DIRECTED BY MD   metoprolol succinate (TOPROL-XL) 25 MG 24 hr tablet Take 1 tablet (25 mg total) by mouth daily. Please keep upcoming appointment in August 2022 for future refills. Thank you (Patient taking differently: Take 12.5 mg by mouth  daily. Please keep upcoming appointment in August 2022 for future refills. Thank you)   NONFORMULARY OR COMPOUNDED ITEM Triamcinolone 0.1% & Silvadene cream 1:1- Apply as directed to affected areas as needed   NONFORMULARY OR COMPOUNDED ITEM    potassium chloride (KLOR-CON) 10 MEQ tablet Take 2 tablets (20 mEq total) by mouth daily.   Probiotic Product (PROBIOTIC DAILY PO) Take by mouth. Ultraflora IB Probiotic   promethazine (PHENERGAN) 25 MG tablet TAKE 1 TABLET BY MOUTH EVERY 6 HOURS AS NEEDED.   Semaglutide, 1 MG/DOSE, 4 MG/3ML SOPN Inject 1 mg as directed once a week.   spironolactone (ALDACTONE) 25 MG tablet Take 1 tablet (25 mg total) by mouth 2 (two) times daily. Please keep upcoming appt in December 2022 with Dr. Radford Pax before anymore refills. Thank you Final Attempt (Patient taking differently: Take 25 mg by mouth daily. Please keep upcoming appt in December 2022 with Dr. Radford Pax before anymore refills. Thank you Final Attempt)   torsemide 40 MG TABS Take 40 mg by mouth daily.   UNABLE TO FIND CPAP: AT bedtime; setting is "12"   [DISCONTINUED] methocarbamol (ROBAXIN) 500 MG tablet TAKE 1 TABLET BY MOUTH DAILY AS NEEDED FOR MUSCLE SPASMS. (Patient not taking: Reported on 05/04/2021)   [DISCONTINUED] tizanidine (ZANAFLEX) 2 MG capsule Take 1 capsule (2 mg total) by mouth at bedtime as needed for muscle spasms.   No facility-administered encounter medications on file as of 05/10/2021.    Follow-up: Return in about 4 months (around 09/09/2021) for [place].   Lorrene Reid, PA-C

## 2021-05-12 ENCOUNTER — Telehealth: Payer: Self-pay

## 2021-05-12 ENCOUNTER — Telehealth: Payer: Self-pay | Admitting: *Deleted

## 2021-05-12 NOTE — Telephone Encounter (Signed)
Spoke with patient and advised we did received her last Renal Function Panel. Patient advised there are other additional labs that are needed. Patient states she had them performed on 04/14/2021 with her other labs. Patient advised we did not receive them at this time. Patient states "you should be able to view them". I advised patient since [facility] is not on the Epic we have no way of viewing the labs. I have contacted [facility] and they are faxing results.

## 2021-05-12 NOTE — Telephone Encounter (Signed)
Labs received from:[facility]  Drawn on:04/14/2021  Reviewed by:Hazel Sams, PA-C  Labs drawn:ANA, C3, C4, dsDNA, SSA, SSB, SPEP  Results:ANA: Neg  C3   165  C4 23  Ds DNA <1  SSA <0.2  SSB <0.2

## 2021-05-12 NOTE — Telephone Encounter (Signed)
Patient states she had labwork at [facility] in September and wanted to make sure Dr. Estanislado Pandy was able to see the results.  Patient states she has another kidney function test tomorrow, 05/13/21 and requested a return call to let her know if any additional labwork is needed.

## 2021-05-13 NOTE — Progress Notes (Signed)
Office Visit Note  Patient: [name]             Date of Birth: Dec 25, 1950           MRN: [id number]             PCP: Lorrene Reid, PA-C Referring: Lorrene Reid, PA-C Visit Date: 05/26/2021 Occupation: @GUAROCC @  Subjective:  Pain in multiple joints  History of Present Illness: [name] is a 70 y.o. female with history of seropositive rheumatoid arthritis, Sjogren's syndrome, fibromyalgia, and osteoarthritis.  She is currently taking Arava 20 mg 1 tablet by mouth daily and Plaquenil 200 mg 1 tablet by mouth daily Monday through Friday.  The dose of Plaquenil was reduced due to the concern of elevated creatinine and low GFR.  She has noticed increased joint pain, stiffness, and swelling on the reduced dose of Plaquenil.  According to the patient she has had several medication changes by her nephrologist, Dr. Moshe Cipro, due to the concern of her drop in GFR.  Her kidney function has gradually started to improve.  She has no longer taking tizanidine or methocarbamol and has noticed increased myalgias and muscle tenderness secondary to fibromyalgia.  She has been having more difficulty sleeping at night without being able to take the tizanidine. She continues to have chronic dry mouth and dry eyes.  Her symptoms are unchanged.  She denies any swollen lymph nodes.  She has intermittent symptoms of Raynaud's but denies any digital ulcerations.  Her symptoms are typically exacerbated by exposure to cold temperatures.    Activities of Daily Living:  Patient reports morning stiffness for 10-15 minutes.   Patient Reports nocturnal pain.  Difficulty dressing/grooming: Denies Difficulty climbing stairs: Reports Difficulty getting out of chair: Reports Difficulty using hands for taps, buttons, cutlery, and/or writing: Reports  Review of Systems  Constitutional:  Positive for fatigue.  HENT:  Positive for mouth dryness. Negative for mouth sores and nose dryness.   Eyes:  Positive  for itching and dryness. Negative for pain.  Respiratory:  Negative for shortness of breath and difficulty breathing.   Cardiovascular:  Positive for palpitations. Negative for chest pain.  Gastrointestinal:  Negative for blood in stool, constipation and diarrhea.  Endocrine: Negative for increased urination.  Genitourinary:  Negative for difficulty urinating.  Musculoskeletal:  Positive for joint pain, joint pain, myalgias, morning stiffness, muscle tenderness and myalgias. Negative for joint swelling.  Skin:  Positive for color change. Negative for rash and redness.  Allergic/Immunologic: Positive for susceptible to infections.  Neurological:  Positive for numbness and weakness. Negative for dizziness, headaches and memory loss.  Hematological:  Positive for bruising/bleeding tendency.  Psychiatric/Behavioral:  Negative for confusion.    PMFS History:  Patient Active Problem List   Diagnosis Date Noted   Other fatigue 02/14/2021   Body mass index (BMI) of 36.0-36.9 in adult 02/14/2021   Prediabetes 02/14/2021   Abnormal kidney function 08/01/2020   Muscle spasm of back 08/01/2020   Microalbuminuria 08/01/2020   Post-menopausal 09/02/2019   Pain in left knee 08/23/2018   Lumbar pain 07/04/2018   Recurrent urticaria 05/20/2018   Rhinitis 05/20/2018   Vitamin D deficiency 11/20/2017   Mixed hyperlipidemia 11/20/2017   Family history of coronary arteriosclerosis- strong fam h/o CAD and early CAD.  07/17/2017   Raynaud's disease without gangrene 03/12/2017   Abnormal weight gain 02/06/2017   Shingles outbreak 02/06/2017   Cyst (solitary) of breast, left 02/04/7627   Eosinophilic esophagitis 31/51/7616   History  of hyperlipidemia 10/04/2016   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   History of diverticulitis 09/03/2016   Osteoporosis 09/03/2016   Autoimmune disease ([place]) 09/02/2016   High risk medication use 09/02/2016   History of esophagitis 09/02/2016   Elevated  triglycerides with high cholesterol 08/23/2016   Low serum HDL 08/23/2016   Breast cyst, left 06/14/2016   Encounter for wellness examination 05/23/2016   Abnormality of gait 05/09/2016   Sjoegren syndrome 02/29/2016   GERD (gastroesophageal reflux disease) 01/19/2016   Glucose intolerance (impaired glucose tolerance) 01/19/2016   Chronic diastolic CHF (congestive heart failure) ([place]) 04/20/2015   Peripheral polyneuropathy 02/02/2014   Morbid obesity ([place]) 11/17/2013   Heart murmur    OSA (obstructive sleep apnea)    Rheumatoid arthritis ([place])    Hiatal hernia    Fibromyalgia    PVC's (premature ventricular contractions)    History of total knee replacement, right 06/24/2011   Unilateral primary osteoarthritis, left knee 06/22/2011    Past Medical History:  Diagnosis Date   Arthritis    Rheumatoid arthritis,    Back pain    Blood transfusion    1981   CHF (congestive heart failure) (HCC)    Chronic diastolic CHF (congestive heart failure) (HCC)    diastolic    DDD (degenerative disc disease), cervical    DDD (degenerative disc disease), lumbar    Edema of both lower extremities    Fibromyalgia    Gallbladder problem    GERD (gastroesophageal reflux disease)    Heart murmur    as a child   Hiatal hernia    sjorgens syndrome   High blood pressure    High cholesterol    IBS (irritable bowel syndrome)    Joint pain    Numbness and tingling of both feet    Numbness of fingers    OSA (obstructive sleep apnea)    Osteoarthritis    Peripheral autonomic neuropathy of unknown cause    Pneumonia 07/2018   Polyneuropathy    Pre-diabetes    PVC (premature ventricular contraction)    Raynaud disease    Rheumatoid arthritis ([place])    Sjogren's disease ([place])    Sleep apnea    SOB (shortness of breath)    Swallowing difficulty    Urticaria    Vitamin D deficiency     Family History  Problem Relation Age of Onset   Alzheimer's disease Mother    Heart attack Mother     Hypertension Mother    Glaucoma Mother    High Cholesterol Mother    Heart disease Mother    Depression Mother    Cancer Father    High Cholesterol Father    Heart disease Father    Sleep apnea Father    Heart attack Brother    Heart disease Brother    Glaucoma Brother    Hyperlipidemia Brother    Glaucoma Brother    Asthma Son    Allergic rhinitis Neg Hx    Angioedema Neg Hx    Eczema Neg Hx    Urticaria Neg Hx    Past Surgical History:  Procedure Laterality Date   ABDOMINAL HYSTERECTOMY     BTL, BSO   ADENOIDECTOMY     BREAST SURGERY     mass removal    CHOLECYSTECTOMY     dental implants     DIAGNOSTIC LAPAROSCOPY     x3   KNEE ARTHROSCOPY     x2   MASS EXCISION Left 03/22/2017  Procedure: EXCISION OF LEFT BREAST MASS;  Surgeon: Donnie Mesa, MD;  Location: [facility];  Service: General;  Laterality: Left;   patotid cystectomy     RIGHT/LEFT HEART CATH AND CORONARY ANGIOGRAPHY N/A 07/25/2019   Procedure: RIGHT/LEFT HEART CATH AND CORONARY ANGIOGRAPHY;  Surgeon: Jolaine Artist, MD;  Location: [facility];  Service: Cardiovascular;  Laterality: N/A;   TENDON REPAIR  1980   left ankle and tibia   TONSILLECTOMY     TOTAL KNEE ARTHROPLASTY  06/21/2011   Procedure: TOTAL KNEE ARTHROPLASTY;  Surgeon: Gearlean Alf;  Location: [facility];  Service: Orthopedics;  Laterality: Right;   TUBAL LIGATION     Social History   Social History Narrative   Lives at home alone   Right-handed   Drinks 1 or less cups of coffee and 2 servings of either tea or soda per day   Immunization History  Administered Date(s) Administered   Fluad Quad(high Dose 65+) 05/05/2021   Influenza, High Dose Seasonal PF 06/12/2018, 05/17/2019   Influenza,inj,Quad PF,6+ Mos 05/23/2016, 06/01/2017   Influenza-Unspecified 06/05/2018, 05/17/2019, 06/07/2020   PFIZER(Purple Top)SARS-COV-2 Vaccination 09/03/2019, 09/24/2019, 04/01/2020, 10/04/2020   Pfizer Covid-19 Vaccine Bivalent Booster 1yrs &  up 05/05/2021   Pneumococcal Conjugate-13 07/02/2017   Pneumococcal Polysaccharide-23 05/17/2005   Td 10/16/2007   Tdap 03/21/2018   Zoster Recombinat (Shingrix) 06/10/2019, 12/11/2019     Objective: Vital Signs: BP 94/60 (BP Location: Left Arm, Patient Position: Sitting, Cuff Size: Normal)   Ht 5\' 1"  (1.549 m)   Wt 192 lb 9.6 oz (87.4 kg)   BMI 36.39 kg/m    Physical Exam Vitals and nursing note reviewed.  Constitutional:      Appearance: She is well-developed.  HENT:     Head: Normocephalic and atraumatic.  Eyes:     Conjunctiva/sclera: Conjunctivae normal.  Pulmonary:     Effort: Pulmonary effort is normal.  Abdominal:     Palpations: Abdomen is soft.  Musculoskeletal:     Cervical back: Normal range of motion.  Skin:    General: Skin is warm and dry.     Capillary Refill: Capillary refill takes less than 2 seconds.  Neurological:     Mental Status: She is alert and oriented to person, place, and time.  Psychiatric:        Behavior: Behavior normal.     Musculoskeletal Exam: Generalized hyperalgesia and positive tender points on exam.  C-spine has slightly limited range of motion with lateral rotation.  No trapezius muscle tension or tenderness at this time. Tenderness over right SI joint. Shoulder joints have good range of motion with some tenderness bilaterally.  Elbow joints have good range of motion with no tenderness or inflammation.  Wrist joints have good range of motion with tenderness over the right wrist.  She has tenderness and synovitis of the right second PIP and left third MCP joint.  Right knee replacement has good range of motion with warmth but no effusion.  Left knee joint has good range of motion with no warmth or effusion.  Ankle joints have good range of motion with no tenderness.  CDAI Exam: CDAI Score: -- Patient Global: --; Provider Global: -- Swollen: 2 ; Tender: 9  Joint Exam 05/26/2021      Right  Left  Wrist   Tender     MCP 3   Tender   Swollen Tender  MCP 4   Tender     PIP 2  Swollen Tender  PIP 3   Tender   Tender  PIP 4      Tender  PIP 5      Tender     Investigation: No additional findings.  Imaging: No results found.  Recent Labs: Lab Results  Component Value Date   WBC 6.6 02/04/2021   HGB 12.7 02/04/2021   PLT 170 02/04/2021   NA 139 02/24/2021   K 4.6 02/24/2021   CL 103 02/24/2021   CO2 22 02/24/2021   GLUCOSE 85 02/24/2021   BUN 25 02/24/2021   CREATININE 1.43 (H) 02/24/2021   BILITOT 0.2 01/31/2021   ALKPHOS 143 (H) 01/31/2021   AST 16 01/31/2021   ALT 15 01/31/2021   PROT 6.3 01/31/2021   ALBUMIN 4.1 01/31/2021   CALCIUM 9.7 02/24/2021   GFRAA 58 (L) 09/23/2020    Speciality Comments: PLQ Eye Exam: 02/08/2021 [facility] follow up in 1 year   Procedures:  No procedures performed Allergies: Sulfa antibiotics, Cymbalta [duloxetine hcl], Demerol, Ivp dye [iodinated diagnostic agents], Morphine and related, Sulfasalazine, and Adhesive [tape]     Assessment / Plan:     Visit Diagnoses: Rheumatoid arthritis involving multiple sites with positive rheumatoid factor ([place]) - Positive RF, positive ANA: She presents today with increased pain and stiffness in both hands.  She has tenderness over the right wrist and several MCP and PIP joints as described above.  Mild synovitis of the right second PIP and left third MCP joint noted.  She was able to make a complete fist bilaterally.  She has been taking Arava 20 mg 1 tablet by mouth daily and Plaquenil 200 mg 1 tablet by mouth daily Monday through Friday.  Patient has history of chronic kidney disease but had a drop in GFR to 28 on 02/04/2021.  She recently established care at [facility] with Dr. Moshe Cipro for management of chronic kidney disease stage IV.  She has had several medication changes including discontinuing tizanidine, methocarbamol, dose reduction of Plaquenil, and dose reduction of metoprolol.  The patient reports  that Dr. Moshe Cipro did not feel that her drop in GFR was due to the Plaquenil dose and was okay with her increasing the dose of Plaquenil back to twice daily Monday through Friday.  I will call to obtain the most recent records.  She has noticed increased joint pain and inflammation since reducing the dose of Plaquenil so she would like to try resuming her previous dose.  We will try increasing the dose of Plaquenil back to twice daily Monday through Friday with close lab monitoring.  She has upcoming lab work at [facility] for further evaluation.  Discussed that Jolee Ewing can occasionally worsen renal function so we may need to consider reducing Arava from 20 mg to 10 mg in the future if her kidney disease continues to progress.  She is avoiding the use of all NSAIDs.  She was advised to notify us if her joint pain and inflammation persists or worsens.  She will follow up in 3 months.   High risk medication use - Arava 20 mg daily and Plaquenil 200 mg 1 tablet by mouth twice daily Monday through Friday. D/c IV orencia due to cost.  PLQ Eye Exam: 02/08/2021 [facility] follow up in 1 year.  CMP drawn on 04/14/2021.  Results were reviewed today in the office.  Her BMP was checked more recently but she does not have these records with her.  We will call [facility] to obtain the most  recent lab results.  CBC will be drawn today to monitor for drug toxicity.  She will continue to require close lab monitoring. - Plan: CBC with Differential/Platelet She has not had any recent infections.  Discussed the importance of holding [place] if she develops signs or symptoms of an infection and to resume once the infection has completely cleared.  Sjogren's syndrome with keratoconjunctivitis sicca ([place]): ANA-, Ro-, and La-: No objective findings confirming oral or ocular dryness-not biopsy proven or proven by imaging.  Patient had updated lab work on 04/14/2021: UA normal, creatinine 1.59 with GFR 35, ANA  negative, C3 165, C4 23, double-stranded DNA negative, Ro-, La-, and SPEP/IFE: Elevation of regions containing acute phase proteins suggesting an acute/subacute inflammatory process.  Discussed lab results with the patient today in the office.  She continues to have chronic sicca symptoms but does not currently meet clinical criteria for Sjogren's syndrome.  According to the patient she has been told that her neuropathy is secondary to Sjogren's syndrome.  Discussed the use of over-the-counter products.  She was advised to notify us if she develops any new or worsening symptoms.  Raynaud's disease without gangrene: She experiences intermittent symptoms of Raynaud's in her fingertips.  Discussed the importance of avoiding exposure to cold temperatures, cigarettes, and extreme emotional stress.  She was advised to notify us if she develops any new or worsening symptoms.  No signs of sclerodactyly were noted on examination today.  No signs of digital ulcerations or gangrene were noted.  Unilateral primary osteoarthritis, left knee: She is good range of motion of the left knee joint.  No warmth or effusion was noted.  History of total knee replacement, right: Doing well.  She has good range of motion with mild warmth but no effusion.  Chronic SI joint pain: She has chronic pain and stiffness in her right SI joint.  Tenderness over the right SI joint was noted on examination today.  DDD (degenerative disc disease), lumbar: No symptoms of radiculopathy at this time.  Fibromyalgia: She is been experiencing more frequent fibromyalgia flares since discontinuing methocarbamol and tizanidine.  She is also been having increased difficulty sleeping at night since discontinuing tizanidine.  The prescription for tizanidine was discontinued due to chronic kidney disease.  I do not recommend restarting tizanidine or methocarbamol at this time.  We discussed the importance of regular exercise and good sleep hygiene for  management of fibromyalgia.  Age-related osteoporosis without current pathological fracture: The BMD measured at Femur Total Right is 0.782 g/cm2 with a T-score of -1.8.  She has not had a fall in over 6 months.  Discussed the importance of fall prevention.  Other medical conditions are listed as follows:  History of hyperlipidemia  Eosinophilic esophagitis  History of peripheral neuropathy  History of depression  History of cardiac murmur  History of diabetes mellitus  History of diverticulitis  CKD (chronic kidney disease) stage 4, GFR 15-29 ml/min (HCC)  Orders: Orders Placed This Encounter  Procedures   CBC with Differential/Platelet   No orders of the defined types were placed in this encounter.    Follow-Up Instructions: Return in about 5 months (around 10/24/2021) for Rheumatoid arthritis, Sjogren's syndrome, Fibromyalgia, Osteoarthritis.   Ofilia Neas, PA-C  Note - This record has been created using Dragon software.  Chart creation errors have been sought, but may not always  have been located. Such creation errors do not reflect on  the standard of medical care.

## 2021-05-17 DIAGNOSIS — N1831 Chronic kidney disease, stage 3a: Secondary | ICD-10-CM | POA: Diagnosis not present

## 2021-05-26 ENCOUNTER — Other Ambulatory Visit: Payer: Self-pay

## 2021-05-26 ENCOUNTER — Ambulatory Visit: Payer: PPO | Admitting: Physician Assistant

## 2021-05-26 ENCOUNTER — Encounter: Payer: Self-pay | Admitting: Physician Assistant

## 2021-05-26 VITALS — BP 94/60 | Ht 61.0 in | Wt 192.6 lb

## 2021-05-26 DIAGNOSIS — M3501 Sicca syndrome with keratoconjunctivitis: Secondary | ICD-10-CM

## 2021-05-26 DIAGNOSIS — M0579 Rheumatoid arthritis with rheumatoid factor of multiple sites without organ or systems involvement: Secondary | ICD-10-CM

## 2021-05-26 DIAGNOSIS — Z96651 Presence of right artificial knee joint: Secondary | ICD-10-CM | POA: Diagnosis not present

## 2021-05-26 DIAGNOSIS — Z8639 Personal history of other endocrine, nutritional and metabolic disease: Secondary | ICD-10-CM | POA: Diagnosis not present

## 2021-05-26 DIAGNOSIS — M810 Age-related osteoporosis without current pathological fracture: Secondary | ICD-10-CM | POA: Diagnosis not present

## 2021-05-26 DIAGNOSIS — K200 Eosinophilic esophagitis: Secondary | ICD-10-CM

## 2021-05-26 DIAGNOSIS — Z79899 Other long term (current) drug therapy: Secondary | ICD-10-CM | POA: Diagnosis not present

## 2021-05-26 DIAGNOSIS — M1712 Unilateral primary osteoarthritis, left knee: Secondary | ICD-10-CM | POA: Diagnosis not present

## 2021-05-26 DIAGNOSIS — M5136 Other intervertebral disc degeneration, lumbar region: Secondary | ICD-10-CM

## 2021-05-26 DIAGNOSIS — I7300 Raynaud's syndrome without gangrene: Secondary | ICD-10-CM | POA: Diagnosis not present

## 2021-05-26 DIAGNOSIS — N184 Chronic kidney disease, stage 4 (severe): Secondary | ICD-10-CM

## 2021-05-26 DIAGNOSIS — M797 Fibromyalgia: Secondary | ICD-10-CM | POA: Diagnosis not present

## 2021-05-26 DIAGNOSIS — Z8719 Personal history of other diseases of the digestive system: Secondary | ICD-10-CM

## 2021-05-26 DIAGNOSIS — M533 Sacrococcygeal disorders, not elsewhere classified: Secondary | ICD-10-CM

## 2021-05-26 DIAGNOSIS — Z8669 Personal history of other diseases of the nervous system and sense organs: Secondary | ICD-10-CM

## 2021-05-26 DIAGNOSIS — G8929 Other chronic pain: Secondary | ICD-10-CM

## 2021-05-26 DIAGNOSIS — Z8659 Personal history of other mental and behavioral disorders: Secondary | ICD-10-CM

## 2021-05-26 DIAGNOSIS — Z8679 Personal history of other diseases of the circulatory system: Secondary | ICD-10-CM

## 2021-05-26 LAB — CBC WITH DIFFERENTIAL/PLATELET
Absolute Monocytes: 673 cells/uL (ref 200–950)
Basophils Absolute: 89 cells/uL (ref 0–200)
Basophils Relative: 1.2 %
Eosinophils Absolute: 222 cells/uL (ref 15–500)
Eosinophils Relative: 3 %
HCT: 42.6 % (ref 35.0–45.0)
Hemoglobin: 14.1 g/dL (ref 11.7–15.5)
Lymphs Abs: 1709 cells/uL (ref 850–3900)
MCH: 28.8 pg (ref 27.0–33.0)
MCHC: 33.1 g/dL (ref 32.0–36.0)
MCV: 87.1 fL (ref 80.0–100.0)
MPV: 11.9 fL (ref 7.5–12.5)
Monocytes Relative: 9.1 %
Neutro Abs: 4706 cells/uL (ref 1500–7800)
Neutrophils Relative %: 63.6 %
Platelets: 219 10*3/uL (ref 140–400)
RBC: 4.89 10*6/uL (ref 3.80–5.10)
RDW: 12.3 % (ref 11.0–15.0)
Total Lymphocyte: 23.1 %
WBC: 7.4 10*3/uL (ref 3.8–10.8)

## 2021-05-26 NOTE — Patient Instructions (Signed)
Standing Labs We placed an order today for your standing lab work.   Please have your standing labs drawn in January and every 3 months   If possible, please have your labs drawn 2 weeks prior to your appointment so that the provider can discuss your results at your appointment.  Please note that you may see your imaging and lab results in MyChart before we have reviewed them. We may be awaiting multiple results to interpret others before contacting you. Please allow our office up to 72 hours to thoroughly review all of the results before contacting the office for clarification of your results.  We have open lab daily: Monday through Thursday from 1:30-4:30 PM and Friday from 1:30-4:00 PM at the office of Dr. Shaili Deveshwar, [facility].   Please be advised, all patients with office appointments requiring lab work will take precedent over walk-in lab work.  If possible, please come for your lab work on Monday and Friday afternoons, as you may experience shorter wait times. The office is located at [address], [address], [place], [place] [address] No appointment is necessary.   Labs are drawn by [facility]. Please bring your co-pay at the time of your lab draw.  You may receive a bill from [facility] for your lab work.  If you wish to have your labs drawn at another location, please call the office 24 hours in advance to send orders.  If you have any questions regarding directions or hours of operation,  please call [phone].   As a reminder, please drink plenty of water prior to coming for your lab work. Thanks!  

## 2021-05-27 ENCOUNTER — Telehealth: Payer: Self-pay | Admitting: Physician Assistant

## 2021-05-27 ENCOUNTER — Other Ambulatory Visit: Payer: Self-pay

## 2021-05-27 DIAGNOSIS — M3501 Sicca syndrome with keratoconjunctivitis: Secondary | ICD-10-CM

## 2021-05-27 DIAGNOSIS — M0579 Rheumatoid arthritis with rheumatoid factor of multiple sites without organ or systems involvement: Secondary | ICD-10-CM

## 2021-05-27 MED ORDER — HYDROXYCHLOROQUINE SULFATE 200 MG PO TABS: ORAL_TABLET | ORAL | 0 refills | Status: DC

## 2021-05-27 MED ORDER — LEFLUNOMIDE 20 MG PO TABS: 20.0000 mg | ORAL_TABLET | Freq: Every day | ORAL | 0 refills | Status: DC

## 2021-05-27 NOTE — Telephone Encounter (Signed)
Refills requested yesterday: Arava and PLQ. (PLQ prescription reflects dose change you advised yesterday). Please review and send to the pharmacy. Thanks!

## 2021-05-27 NOTE — Telephone Encounter (Signed)
Lab work from 05/17/2021 was received today from [facility].  Creatinine was 1.21 and a GFR was 49-improved.   I also reviewed the most recent office visit note with Dr. Moshe Cipro from 04/14/21.   Drop in GFR was not felt to be due to a rheumatologic cause.   Hazel Sams, PA-C

## 2021-05-27 NOTE — Progress Notes (Signed)
CBC WNL

## 2021-05-31 ENCOUNTER — Ambulatory Visit (INDEPENDENT_AMBULATORY_CARE_PROVIDER_SITE_OTHER): Payer: PPO | Admitting: Family Medicine

## 2021-06-14 ENCOUNTER — Ambulatory Visit (INDEPENDENT_AMBULATORY_CARE_PROVIDER_SITE_OTHER): Payer: PPO | Admitting: Family Medicine

## 2021-06-14 ENCOUNTER — Encounter (INDEPENDENT_AMBULATORY_CARE_PROVIDER_SITE_OTHER): Payer: Self-pay | Admitting: Family Medicine

## 2021-06-14 ENCOUNTER — Other Ambulatory Visit: Payer: Self-pay

## 2021-06-14 ENCOUNTER — Other Ambulatory Visit: Payer: Self-pay | Admitting: Cardiology

## 2021-06-14 VITALS — BP 122/74 | HR 73 | Temp 98.2°F | Ht 61.0 in | Wt 189.0 lb

## 2021-06-14 DIAGNOSIS — J3089 Other allergic rhinitis: Secondary | ICD-10-CM

## 2021-06-14 DIAGNOSIS — R7309 Other abnormal glucose: Secondary | ICD-10-CM

## 2021-06-14 DIAGNOSIS — Z6841 Body Mass Index (BMI) 40.0 and over, adult: Secondary | ICD-10-CM

## 2021-06-14 MED ORDER — SEMAGLUTIDE (1 MG/DOSE) 4 MG/3ML ~~LOC~~ SOPN: 1.0000 mg | PEN_INJECTOR | SUBCUTANEOUS | 0 refills | Status: DC

## 2021-06-14 MED ORDER — FLUTICASONE PROPIONATE 50 MCG/ACT NA SUSP: 1.0000 | Freq: Every day | NASAL | 0 refills | Status: DC

## 2021-06-15 NOTE — Progress Notes (Signed)
Chief Complaint:   OBESITY [name] is here to discuss her progress with her obesity treatment plan along with follow-up of her obesity related diagnoses. [name] is on the Category 2 Plan and the Category 3 Plan and states she is following her eating plan approximately 75-80% of the time. [name] states she is walking 1 mile 6-7 times per week.  Today's visit was #: 26 Starting weight: 239 lbs Starting date: 12/01/2019 Today's weight: 189 lbs Today's date: 06/14/2021 Total lbs lost to date: 50 Total lbs lost since last in-office visit: 0  Interim History: [name] feels like she is retaining some fluid, which she attributes to change in her diuretic. She has no definitive follow up with [facility]. Pt has her 50th high school reunion and her birthday since her last appt. She is celebrating Thanksgiving early and then her kids are going oversees. She is not hungry on [name].  Subjective:   1. Allergic rhinitis due to other allergic trigger, unspecified seasonality [name] is on Flonase and Xyzal with good control of symptoms.  2. Elevated random blood glucose level [name] is on [name] with good control of hunger/appetite. Her last A1c was 5.2 and insulin level 14.2.  Assessment/Plan:   1. Allergic rhinitis due to other allergic trigger, unspecified seasonality Continue current treatment plan.  Refill- fluticasone (FLONASE) 50 MCG/ACT nasal spray; Place 1 spray into both nostrils daily.  Dispense: 16 g; Refill: 0  2. Elevated random blood glucose level Continue [name] as directed.  Refill- Semaglutide, 1 MG/DOSE, 4 MG/3ML SOPN; Inject 1 mg as directed once a week.  Dispense: 3 mL; Refill: 0  3. Obesity BMI today 69  [name] is currently in the action stage of change. As such, her goal is to continue with weight loss efforts. She has agreed to the Category 2 Plan and the Category 3 Plan.   Exercise goals:  As is  Behavioral modification strategies: increasing lean protein intake, meal  planning and cooking strategies, keeping healthy foods in the home, and planning for success.  [name] has agreed to follow-up with our clinic in 3 weeks. She was informed of the importance of frequent follow-up visits to maximize her success with intensive lifestyle modifications for her multiple health conditions.   Objective:   Blood pressure 122/74, pulse 73, temperature 98.2 F (36.8 C), height 5\' 1"  (1.549 m), weight 189 lb (85.7 kg), SpO2 97 %. Body mass index is 35.71 kg/m.  General: Cooperative, alert, well developed, in no acute distress. HEENT: Conjunctivae and lids unremarkable. Cardiovascular: Regular rhythm.  Lungs: Normal work of breathing. Neurologic: No focal deficits.   Lab Results  Component Value Date   CREATININE 1.43 (H) 02/24/2021   BUN 25 02/24/2021   NA 139 02/24/2021   K 4.6 02/24/2021   CL 103 02/24/2021   CO2 22 02/24/2021   Lab Results  Component Value Date   ALT 15 01/31/2021   AST 16 01/31/2021   ALKPHOS 143 (H) 01/31/2021   BILITOT 0.2 01/31/2021   Lab Results  Component Value Date   HGBA1C 5.2 01/31/2021   HGBA1C 5.5 09/23/2020   HGBA1C 5.8 (H) 05/11/2020   HGBA1C 6.2 (H) 12/01/2019   HGBA1C 6.1 (H) 08/26/2019   Lab Results  Component Value Date   INSULIN 14.2 01/31/2021   INSULIN 25.4 (H) 09/23/2020   INSULIN 21.0 06/01/2020   INSULIN 32.3 (H) 12/01/2019   Lab Results  Component Value Date   TSH 1.550 12/01/2019   Lab Results  Component Value Date   CHOL 167 01/31/2021   HDL 39 (L) 01/31/2021   LDLCALC 86 01/31/2021   TRIG 253 (H) 01/31/2021   CHOLHDL 3.5 08/26/2019   Lab Results  Component Value Date   VD25OH 92.2 09/23/2020   VD25OH 62.9 12/01/2019   VD25OH 61.3 08/26/2019   Lab Results  Component Value Date   WBC 7.4 05/26/2021   HGB 14.1 05/26/2021   HCT 42.6 05/26/2021   MCV 87.1 05/26/2021   PLT 219 05/26/2021   No results found for: IRON, TIBC, FERRITIN  Obesity Behavioral Intervention:   Approximately  15 minutes were spent on the discussion below.  ASK: We discussed the diagnosis of obesity with [name] today and [name] agreed to give [place] permission to discuss obesity behavioral modification therapy today.  ASSESS: [name] has the diagnosis of obesity and her BMI today is 35.7. [name] is in the action stage of change.   ADVISE: [name] was educated on the multiple health risks of obesity as well as the benefit of weight loss to improve her health. She was advised of the need for long term treatment and the importance of lifestyle modifications to improve her current health and to decrease her risk of future health problems.  AGREE: Multiple dietary modification options and treatment options were discussed and [name] agreed to follow the recommendations documented in the above note.  ARRANGE: [name] was educated on the importance of frequent visits to treat obesity as outlined per CMS and USPSTF guidelines and agreed to schedule her next follow up appointment today.  Attestation Statements:   Reviewed by clinician on day of visit: allergies, medications, problem list, medical history, surgical history, family history, social history, and previous encounter notes.  Coral Ceo, CMA, am acting as transcriptionist for Coralie Common, MD.   I have reviewed the above documentation for accuracy and completeness, and I agree with the above. - Coralie Common, MD

## 2021-06-29 NOTE — Telephone Encounter (Signed)
Contacted [facility]. The pharmacist states she did not receive the prescription for PLQ which was sent on 05/27/2021. Gave verbal for the dose change as it was sent on 05/27/2021.

## 2021-07-05 ENCOUNTER — Other Ambulatory Visit: Payer: Self-pay

## 2021-07-05 ENCOUNTER — Ambulatory Visit (INDEPENDENT_AMBULATORY_CARE_PROVIDER_SITE_OTHER): Payer: PPO | Admitting: Family Medicine

## 2021-07-05 ENCOUNTER — Encounter (INDEPENDENT_AMBULATORY_CARE_PROVIDER_SITE_OTHER): Payer: Self-pay | Admitting: Family Medicine

## 2021-07-05 VITALS — BP 95/63 | HR 86 | Temp 98.9°F | Ht 61.0 in | Wt 190.0 lb

## 2021-07-05 DIAGNOSIS — E781 Pure hyperglyceridemia: Secondary | ICD-10-CM | POA: Diagnosis not present

## 2021-07-05 DIAGNOSIS — Z6841 Body Mass Index (BMI) 40.0 and over, adult: Secondary | ICD-10-CM | POA: Diagnosis not present

## 2021-07-05 DIAGNOSIS — R7309 Other abnormal glucose: Secondary | ICD-10-CM

## 2021-07-05 DIAGNOSIS — R739 Hyperglycemia, unspecified: Secondary | ICD-10-CM

## 2021-07-05 MED ORDER — OZEMPIC (0.25 OR 0.5 MG/DOSE) 2 MG/1.5ML ~~LOC~~ SOPN: 0.5000 mg | PEN_INJECTOR | SUBCUTANEOUS | 0 refills | Status: DC

## 2021-07-05 NOTE — Progress Notes (Signed)
Chief Complaint:   OBESITY [name] is here to discuss her progress with her obesity treatment plan along with follow-up of her obesity related diagnoses. [name] is on the Category 2 Plan and the Category 3 Plan and states she is following her eating plan approximately 85% of the time. [name] states she is walking 1 mile 3 times per week.  Today's visit was #: 30 Starting weight: 239 lbs Starting date: 12/01/2019 Today's weight: 190 lbs Today's date: 07/05/2021 Total lbs lost to date: 14 Total lbs lost since last in-office visit: 0  Interim History: [name]'s family left to go back overseas a week ago. She is excited about her trip to [place] next week. She had Thanksgiving at her house a week ago and has a Christmas party in 6 days. Yesterday, pt didn't eat anything except protein all day.  Subjective:   1. Elevated random blood glucose level [name] is not always able to finish meals. She is focusing on protein intake. She randomly may be able to get up to ~1350 calories.  2. Hypertriglyceridemia Pt's triglycerides are 253, HDL 39, and LDL 86. She is not on statin therapy.  Assessment/Plan:   1. Elevated random blood glucose level [name] will decrease Ozempic to 0.5 mg as directed.  Refill- Semaglutide,0.25 or 0.5MG /DOS, (OZEMPIC, 0.25 OR 0.5 MG/DOSE,) 2 MG/1.5ML SOPN; Inject 0.5 mg into the skin once a week.  Dispense: 1.5 mL; Refill: 0  2. Hypertriglyceridemia Follow up labs January 2023. Cardiovascular risk and specific lipid/LDL goals reviewed.  We discussed several lifestyle modifications today and [name] will continue to work on diet, exercise and weight loss efforts. Orders and follow up as documented in patient record.   Counseling Intensive lifestyle modifications are the first line treatment for this issue. Dietary changes: Increase soluble fiber. Decrease simple carbohydrates. Exercise changes: Moderate to vigorous-intensity aerobic activity 150 minutes per week if  tolerated. Lipid-lowering medications: see documented in medical record.  3. Obesity BMI today is 51  [name] is currently in the action stage of change. As such, her goal is to continue with weight loss efforts. She has agreed to the Category 2 Plan and the Category 3 Plan.   Exercise goals: All adults should avoid inactivity. Some physical activity is better than none, and adults who participate in any amount of physical activity gain some health benefits.  Behavioral modification strategies: increasing lean protein intake, no skipping meals, meal planning and cooking strategies, travel eating strategies, and planning for success.  [name] has agreed to follow-up with our clinic in 3 weeks. She was informed of the importance of frequent follow-up visits to maximize her success with intensive lifestyle modifications for her multiple health conditions.   Objective:   Blood pressure 95/63, pulse 86, temperature 98.9 F (37.2 C), height 5\' 1"  (1.549 m), weight 190 lb (86.2 kg), SpO2 99 %. Body mass index is 35.9 kg/m.  General: Cooperative, alert, well developed, in no acute distress. HEENT: Conjunctivae and lids unremarkable. Cardiovascular: Regular rhythm.  Lungs: Normal work of breathing. Neurologic: No focal deficits.   Lab Results  Component Value Date   CREATININE 1.43 (H) 02/24/2021   BUN 25 02/24/2021   NA 139 02/24/2021   K 4.6 02/24/2021   CL 103 02/24/2021   CO2 22 02/24/2021   Lab Results  Component Value Date   ALT 15 01/31/2021   AST 16 01/31/2021   ALKPHOS 143 (H) 01/31/2021   BILITOT 0.2 01/31/2021   Lab Results  Component Value  Date   HGBA1C 5.2 01/31/2021   HGBA1C 5.5 09/23/2020   HGBA1C 5.8 (H) 05/11/2020   HGBA1C 6.2 (H) 12/01/2019   HGBA1C 6.1 (H) 08/26/2019   Lab Results  Component Value Date   INSULIN 14.2 01/31/2021   INSULIN 25.4 (H) 09/23/2020   INSULIN 21.0 06/01/2020   INSULIN 32.3 (H) 12/01/2019   Lab Results  Component Value Date   TSH  1.550 12/01/2019   Lab Results  Component Value Date   CHOL 167 01/31/2021   HDL 39 (L) 01/31/2021   LDLCALC 86 01/31/2021   TRIG 253 (H) 01/31/2021   CHOLHDL 3.5 08/26/2019   Lab Results  Component Value Date   VD25OH 92.2 09/23/2020   VD25OH 62.9 12/01/2019   VD25OH 61.3 08/26/2019   Lab Results  Component Value Date   WBC 7.4 05/26/2021   HGB 14.1 05/26/2021   HCT 42.6 05/26/2021   MCV 87.1 05/26/2021   PLT 219 05/26/2021    Attestation Statements:   Reviewed by clinician on day of visit: allergies, medications, problem list, medical history, surgical history, family history, social history, and previous encounter notes.  Coral Ceo, CMA, am acting as transcriptionist for Coralie Common, MD.   I have reviewed the above documentation for accuracy and completeness, and I agree with the above. - Coralie Common, MD

## 2021-07-05 NOTE — Telephone Encounter (Signed)
Please advise 

## 2021-07-11 ENCOUNTER — Other Ambulatory Visit (INDEPENDENT_AMBULATORY_CARE_PROVIDER_SITE_OTHER): Payer: Self-pay | Admitting: Family Medicine

## 2021-07-11 DIAGNOSIS — R7309 Other abnormal glucose: Secondary | ICD-10-CM

## 2021-07-11 MED ORDER — SEMAGLUTIDE (1 MG/DOSE) 4 MG/3ML ~~LOC~~ SOPN: 1.0000 mg | PEN_INJECTOR | SUBCUTANEOUS | 0 refills | Status: DC

## 2021-07-11 NOTE — Telephone Encounter (Signed)
Please advise 

## 2021-07-11 NOTE — Telephone Encounter (Signed)
Request 1mg  to be sent to pharmacy

## 2021-07-18 ENCOUNTER — Ambulatory Visit: Payer: PPO | Admitting: Cardiology

## 2021-07-19 DIAGNOSIS — E876 Hypokalemia: Secondary | ICD-10-CM | POA: Diagnosis not present

## 2021-07-19 DIAGNOSIS — I5032 Chronic diastolic (congestive) heart failure: Secondary | ICD-10-CM | POA: Diagnosis not present

## 2021-07-19 DIAGNOSIS — N1831 Chronic kidney disease, stage 3a: Secondary | ICD-10-CM | POA: Diagnosis not present

## 2021-07-27 ENCOUNTER — Ambulatory Visit (INDEPENDENT_AMBULATORY_CARE_PROVIDER_SITE_OTHER): Payer: PPO | Admitting: Family Medicine

## 2021-07-27 ENCOUNTER — Encounter (INDEPENDENT_AMBULATORY_CARE_PROVIDER_SITE_OTHER): Payer: Self-pay

## 2021-07-27 DIAGNOSIS — I5032 Chronic diastolic (congestive) heart failure: Secondary | ICD-10-CM | POA: Diagnosis not present

## 2021-07-27 DIAGNOSIS — E261 Secondary hyperaldosteronism: Secondary | ICD-10-CM | POA: Diagnosis not present

## 2021-07-27 DIAGNOSIS — I739 Peripheral vascular disease, unspecified: Secondary | ICD-10-CM | POA: Diagnosis not present

## 2021-07-27 DIAGNOSIS — Z6834 Body mass index (BMI) 34.0-34.9, adult: Secondary | ICD-10-CM | POA: Diagnosis not present

## 2021-07-27 DIAGNOSIS — G629 Polyneuropathy, unspecified: Secondary | ICD-10-CM | POA: Diagnosis not present

## 2021-07-27 DIAGNOSIS — Z7982 Long term (current) use of aspirin: Secondary | ICD-10-CM | POA: Diagnosis not present

## 2021-07-27 DIAGNOSIS — I110 Hypertensive heart disease with heart failure: Secondary | ICD-10-CM | POA: Diagnosis not present

## 2021-08-14 ENCOUNTER — Encounter (INDEPENDENT_AMBULATORY_CARE_PROVIDER_SITE_OTHER): Payer: Self-pay

## 2021-08-15 ENCOUNTER — Other Ambulatory Visit: Payer: Self-pay | Admitting: Cardiology

## 2021-08-16 ENCOUNTER — Other Ambulatory Visit (INDEPENDENT_AMBULATORY_CARE_PROVIDER_SITE_OTHER): Payer: Self-pay | Admitting: Family Medicine

## 2021-08-16 DIAGNOSIS — R7309 Other abnormal glucose: Secondary | ICD-10-CM

## 2021-08-16 MED ORDER — SEMAGLUTIDE (1 MG/DOSE) 4 MG/3ML ~~LOC~~ SOPN: 1.0000 mg | PEN_INJECTOR | SUBCUTANEOUS | 0 refills | Status: DC

## 2021-08-16 NOTE — Telephone Encounter (Signed)
What rx does she need?

## 2021-08-16 NOTE — Telephone Encounter (Signed)
Ozempic  LAST APPOINTMENT DATE: 07/05/21 NEXT APPOINTMENT DATE: 08/23/21   [facility] - [place], [place] [address] Phone: [phone] Fax: [phone]  Patient is requesting a refill of the following medications: No prescriptions requested or ordered in this encounter   Date last filled: 07/05/21 Previously prescribed by Dr Jearld Shines  Lab Results      Component                Value               Date                      HGBA1C                   5.2                 01/31/2021                HGBA1C                   5.5                 09/23/2020                HGBA1C                   5.8 (H)             05/11/2020           Lab Results      Component                Value               Date                      MICROALBUR               80                  07/28/2020                LDLCALC                  86                  01/31/2021                CREATININE               1.43 (H)            02/24/2021           Lab Results      Component                Value               Date                      VD25OH                   92.2                09/23/2020                VD25OH  62.9                12/01/2019                VD25OH                   61.3                08/26/2019            BP Readings from Last 3 Encounters: 07/05/21 : 95/63 06/14/21 : 122/74 05/26/21 : 94/60

## 2021-08-18 NOTE — Progress Notes (Signed)
Office Visit Note  Patient: [name]             Date of Birth: 04-11-1951           MRN: [id number]             PCP: Lorrene Reid, PA-C Referring: Lorrene Reid, PA-C Visit Date: 08/31/2021 Occupation: @GUAROCC @  Subjective:  Right shoulder pain.   History of Present Illness: [name] is a 71 y.o. female with a history of rheumatoid arthritis, sicca symptoms, osteoarthritis, degenerative disc disease, osteoporosis and fibromyalgia syndrome.  She continues to have some generalized pain and discomfort.  She has chronic pain in her lower back which comes and goes.  She has pain and stiffness in her bilateral hands.  Recently she has been experiencing pain in her right shoulder.  She has difficulty raising her right arm and sleeping on the right side.  She gives history of nocturnal pain.  She has difficulty climbing stairs and getting up from the chair.  She continues to have dry mouth and dry eyes symptoms.  She has been using over-the-counter products.  She has generalized pain from fibromyalgia.  Bilateral knee replacements are doing well.    Activities of Daily Living:  Patient reports morning stiffness for 15-20 minutes.   Patient Reports nocturnal pain.  Difficulty dressing/grooming: Reports Difficulty climbing stairs: Reports Difficulty getting out of chair: Reports Difficulty using hands for taps, buttons, cutlery, and/or writing: Reports  Review of Systems  Constitutional:  Positive for fatigue.  HENT:  Positive for mouth dryness. Negative for mouth sores and nose dryness.   Eyes:  Positive for itching and dryness. Negative for pain.  Respiratory:  Negative for shortness of breath and difficulty breathing.   Cardiovascular:  Positive for palpitations. Negative for chest pain.  Gastrointestinal:  Negative for blood in stool, constipation and diarrhea.  Endocrine: Negative for increased urination.  Genitourinary:  Negative for difficulty urinating.   Musculoskeletal:  Positive for joint pain, joint pain, joint swelling, myalgias, morning stiffness, muscle tenderness and myalgias.  Skin:  Positive for color change. Negative for rash and redness.  Allergic/Immunologic: Negative for susceptible to infections.  Neurological:  Positive for numbness. Negative for dizziness, headaches, memory loss and weakness.  Hematological:  Positive for bruising/bleeding tendency.  Psychiatric/Behavioral:  Negative for confusion.    PMFS History:  Patient Active Problem List   Diagnosis Date Noted   Other fatigue 02/14/2021   Body mass index (BMI) of 36.0-36.9 in adult 02/14/2021   Prediabetes 02/14/2021   Abnormal kidney function 08/01/2020   Muscle spasm of back 08/01/2020   Microalbuminuria 08/01/2020   Post-menopausal 09/02/2019   Pain in left knee 08/23/2018   Lumbar pain 07/04/2018   Recurrent urticaria 05/20/2018   Rhinitis 05/20/2018   Vitamin D deficiency 11/20/2017   Mixed hyperlipidemia 11/20/2017   Family history of coronary arteriosclerosis- strong fam h/o CAD and early CAD.  07/17/2017   Raynaud's disease without gangrene 03/12/2017   Abnormal weight gain 02/06/2017   Shingles outbreak 02/06/2017   Cyst (solitary) of breast, left 76/22/6333   Eosinophilic esophagitis 54/56/2563   History of hyperlipidemia 10/04/2016   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   History of diverticulitis 09/03/2016   Osteoporosis 09/03/2016   Autoimmune disease ([place]) 09/02/2016   High risk medication use 09/02/2016   History of esophagitis 09/02/2016   Elevated triglycerides with high cholesterol 08/23/2016   Low serum HDL 08/23/2016   Breast cyst, left 06/14/2016   Encounter for  wellness examination 05/23/2016   Abnormality of gait 05/09/2016   Sjoegren syndrome 02/29/2016   GERD (gastroesophageal reflux disease) 01/19/2016   Glucose intolerance (impaired glucose tolerance) 01/19/2016   Chronic diastolic CHF (congestive heart failure)  ([place]) 04/20/2015   Peripheral polyneuropathy 02/02/2014   Morbid obesity ([place]) 11/17/2013   Heart murmur    OSA (obstructive sleep apnea)    Rheumatoid arthritis ([place])    Hiatal hernia    Fibromyalgia    PVC's (premature ventricular contractions)    History of total knee replacement, right 06/24/2011   Unilateral primary osteoarthritis, left knee 06/22/2011    Past Medical History:  Diagnosis Date   Arthritis    Rheumatoid arthritis,    Back pain    Blood transfusion    1981   CHF (congestive heart failure) (HCC)    Chronic diastolic CHF (congestive heart failure) (HCC)    diastolic    DDD (degenerative disc disease), cervical    DDD (degenerative disc disease), lumbar    Edema of both lower extremities    Fibromyalgia    Gallbladder problem    GERD (gastroesophageal reflux disease)    Heart murmur    as a child   Hiatal hernia    sjorgens syndrome   High blood pressure    High cholesterol    IBS (irritable bowel syndrome)    Joint pain    Numbness and tingling of both feet    Numbness of fingers    OSA (obstructive sleep apnea)    Osteoarthritis    Peripheral autonomic neuropathy of unknown cause    Pneumonia 07/2018   Polyneuropathy    Pre-diabetes    PVC (premature ventricular contraction)    Raynaud disease    Rheumatoid arthritis ([place])    Sjogren's disease ([place])    Sleep apnea    SOB (shortness of breath)    Swallowing difficulty    Urticaria    Vitamin D deficiency     Family History  Problem Relation Age of Onset   Alzheimer's disease Mother    Heart attack Mother    Hypertension Mother    Glaucoma Mother    High Cholesterol Mother    Heart disease Mother    Depression Mother    Cancer Father    High Cholesterol Father    Heart disease Father    Sleep apnea Father    Heart attack Brother    Heart disease Brother    Glaucoma Brother    Hyperlipidemia Brother    Glaucoma Brother    Asthma Son    Allergic rhinitis Neg Hx    Angioedema Neg  Hx    Eczema Neg Hx    Urticaria Neg Hx    Neuropathy Neg Hx    Past Surgical History:  Procedure Laterality Date   ABDOMINAL HYSTERECTOMY     BTL, BSO   ADENOIDECTOMY     BREAST SURGERY     mass removal    CHOLECYSTECTOMY     dental implants     DIAGNOSTIC LAPAROSCOPY     x3   KNEE ARTHROSCOPY     x2   MASS EXCISION Left 03/22/2017   Procedure: EXCISION OF LEFT BREAST MASS;  Surgeon: Donnie Mesa, MD;  Location: [facility];  Service: General;  Laterality: Left;   patotid cystectomy     RIGHT/LEFT HEART CATH AND CORONARY ANGIOGRAPHY N/A 07/25/2019   Procedure: RIGHT/LEFT HEART CATH AND CORONARY ANGIOGRAPHY;  Surgeon: Jolaine Artist, MD;  Location: [facility];  Service: Cardiovascular;  Laterality: N/A;   TENDON REPAIR  1980   left ankle and tibia   TONSILLECTOMY     TOTAL KNEE ARTHROPLASTY  06/21/2011   Procedure: TOTAL KNEE ARTHROPLASTY;  Surgeon: Gearlean Alf;  Location: [facility];  Service: Orthopedics;  Laterality: Right;   TUBAL LIGATION     Social History   Social History Narrative   Lives at home alone   Right-handed   Drinks 1 or less cups of coffee and 2 servings of either tea or soda per day   Immunization History  Administered Date(s) Administered   Fluad Quad(high Dose 65+) 05/05/2021   Influenza, High Dose Seasonal PF 06/12/2018, 05/17/2019   Influenza,inj,Quad PF,6+ Mos 05/23/2016, 06/01/2017   Influenza-Unspecified 06/05/2018, 05/17/2019, 06/07/2020   PFIZER(Purple Top)SARS-COV-2 Vaccination 09/03/2019, 09/24/2019, 04/01/2020, 10/04/2020   Pfizer Covid-19 Vaccine Bivalent Booster 34yrs & up 05/05/2021   Pneumococcal Conjugate-13 07/02/2017   Pneumococcal Polysaccharide-23 05/17/2005   Td 10/16/2007   Tdap 03/21/2018   Zoster Recombinat (Shingrix) 06/10/2019, 12/11/2019     Objective: Vital Signs: BP 121/74 (BP Location: Left Arm, Patient Position: Sitting, Cuff Size: Large)    Pulse 80    Ht 5\' 1"  (1.549 m)    Wt 198 lb 9.6 oz (90.1 kg)     BMI 37.53 kg/m    Physical Exam Vitals and nursing note reviewed.  Constitutional:      Appearance: She is well-developed.  HENT:     Head: Normocephalic and atraumatic.  Eyes:     Conjunctiva/sclera: Conjunctivae normal.  Cardiovascular:     Rate and Rhythm: Normal rate and regular rhythm.     Heart sounds: Normal heart sounds.  Pulmonary:     Effort: Pulmonary effort is normal.     Breath sounds: Normal breath sounds.  Abdominal:     General: Bowel sounds are normal.     Palpations: Abdomen is soft.  Musculoskeletal:     Cervical back: Normal range of motion.  Lymphadenopathy:     Cervical: No cervical adenopathy.  Skin:    General: Skin is warm and dry.     Capillary Refill: Capillary refill takes less than 2 seconds.  Neurological:     Mental Status: She is alert and oriented to person, place, and time.  Psychiatric:        Behavior: Behavior normal.     Musculoskeletal Exam: Limited range of motion of the cervical spine.  She had thoracolumbar scoliosis.  She had painful limited range of motion of her right shoulder joint.  She also had some discomfort in the right arm.  Elbow joints with good range of motion.  Wrist joints, MCPs PIPs and DIPs with good range of motion with no synovitis.  Hip joints and knee joints in good range of motion.  She had no tenderness over ankles or MTPs.  CDAI Exam: CDAI Score: 0.9  Patient Global: 5 mm; Provider Global: 4 mm Swollen: 0 ; Tender: 0  Joint Exam 08/31/2021   No joint exam has been documented for this visit   There is currently no information documented on the homunculus. Go to the Rheumatology activity and complete the homunculus joint exam.  Investigation: No additional findings.  Imaging: XR Shoulder Right  Result Date: 08/31/2021 Prominent subarticular space at that glenoid humeral head articulation.  Acromioclavicular joint space narrowing was noted.  No chondrocalcinosis was noted. Impression.:  These findings  are consistent with acromioclavicular arthritis and possible rotator cuff tear.   Recent  Labs: Lab Results  Component Value Date   WBC 7.4 05/26/2021   HGB 14.1 05/26/2021   PLT 219 05/26/2021   NA 139 02/24/2021   K 4.6 02/24/2021   CL 103 02/24/2021   CO2 22 02/24/2021   GLUCOSE 85 02/24/2021   BUN 25 02/24/2021   CREATININE 1.43 (H) 02/24/2021   BILITOT 0.2 01/31/2021   ALKPHOS 143 (H) 01/31/2021   AST 16 01/31/2021   ALT 15 01/31/2021   PROT 6.3 01/31/2021   ALBUMIN 4.1 01/31/2021   CALCIUM 9.7 02/24/2021   GFRAA 58 (L) 09/23/2020    Speciality Comments: PLQ Eye Exam: 02/08/2021 BTD@ [facility] follow up in 1 year   Procedures:  No procedures performed Allergies: Sulfa antibiotics, Cymbalta [duloxetine hcl], Demerol, Ivp dye [iodinated contrast media], Morphine and related, Sulfasalazine, and Adhesive [tape]   Assessment / Plan:     Visit Diagnoses: Rheumatoid arthritis involving multiple sites with positive rheumatoid factor ([place]) - Positive RF, positive ANA: She had no synovitis on my examination.  She has intermittent pain and discomfort.  High risk medication use - Arava 20 mg daily and Plaquenil 200 mg 1 tablet by mouth twice daily Monday through Friday. D/c IV orencia due to cost.  PLQ Eye Exam: 02/08/2021 - Plan: CBC with Differential/Platelet, COMPLETE METABOLIC PANEL WITH GFR  Sicca syndrome (HCC) - ANA-, Ro-, and La-: She continues to have dry mouth and dry eyes.  Over-the-counter products have been helpful.  Raynaud's disease without gangrene-she has intermittent Raynaud's symptoms.  Chronic right shoulder pain -she has been having increased pain and discomfort in her right shoulder and difficulty lifting her right arm.  Patient states that she took some ibuprofen over-the-counter recently.  I discouraged the use of NSAIDs due to chronic renal disease.  Plan: XR Shoulder Right.  The x-rays show prominent subarticular glenoid humeral space suspicious of  rotator cuff tear.  Acromioclavicular joint space narrowing was noted.  X-ray findings were discussed with the patient.  She would like to give it some time.  If her symptoms persist then she would contact us.  Unilateral primary osteoarthritis, left knee-she had good range of motion without discomfort.  History of total knee replacement, right-she had good range of motion without discomfort.  Chronic SI joint pain-she continues to have chronic discomfort intermittently.  DDD (degenerative disc disease), lumbar-intermittent pain.  Fibromyalgia-she has generalized pain and discomfort.  Age-related osteoporosis without current pathological fracture - DEXA 08/27/2019: The BMD measured at [facility] Right is 0.782 g/cm2 with a T-score of -1.8. s/p Forte[place].  We will get repeat DEXA scan.  Other medical problems are listed as follows:  History of hyperlipidemia  History of cardiac murmur  History of diabetes mellitus  CKD (chronic kidney disease) stage 4, GFR 15-29 ml/min (HCC)-she is followed by nephrologist.  History of peripheral neuropathy  History of diverticulitis  Eosinophilic esophagitis  History of depression  Orders: Orders Placed This Encounter  Procedures   XR Shoulder Right   DG Bone Density   CBC with Differential/Platelet   COMPLETE METABOLIC PANEL WITH GFR   No orders of the defined types were placed in this encounter.    Follow-Up Instructions: Return in about 5 months (around 01/29/2022) for Rheumatoid arthritis, Osteoarthritis, Osteoporosis.   Bo Merino, MD  Note - This record has been created using Editor, commissioning.  Chart creation errors have been sought, but may not always  have been located. Such creation errors do not reflect on  the standard of  medical care.

## 2021-08-23 ENCOUNTER — Encounter (INDEPENDENT_AMBULATORY_CARE_PROVIDER_SITE_OTHER): Payer: Self-pay | Admitting: Family Medicine

## 2021-08-23 ENCOUNTER — Ambulatory Visit (INDEPENDENT_AMBULATORY_CARE_PROVIDER_SITE_OTHER): Payer: PPO | Admitting: Family Medicine

## 2021-08-23 ENCOUNTER — Other Ambulatory Visit: Payer: Self-pay

## 2021-08-23 VITALS — BP 118/81 | HR 69 | Temp 98.2°F | Ht 61.0 in | Wt 192.0 lb

## 2021-08-23 DIAGNOSIS — Z6836 Body mass index (BMI) 36.0-36.9, adult: Secondary | ICD-10-CM

## 2021-08-23 DIAGNOSIS — R7309 Other abnormal glucose: Secondary | ICD-10-CM | POA: Diagnosis not present

## 2021-08-23 DIAGNOSIS — E669 Obesity, unspecified: Secondary | ICD-10-CM | POA: Diagnosis not present

## 2021-08-23 DIAGNOSIS — J3089 Other allergic rhinitis: Secondary | ICD-10-CM

## 2021-08-23 DIAGNOSIS — Z6841 Body Mass Index (BMI) 40.0 and over, adult: Secondary | ICD-10-CM

## 2021-08-23 MED ORDER — SEMAGLUTIDE (1 MG/DOSE) 4 MG/3ML ~~LOC~~ SOPN: 1.0000 mg | PEN_INJECTOR | SUBCUTANEOUS | 0 refills | Status: DC

## 2021-08-23 MED ORDER — LEVOCETIRIZINE DIHYDROCHLORIDE 5 MG PO TABS: 5.0000 mg | ORAL_TABLET | Freq: Every evening | ORAL | 1 refills | Status: DC

## 2021-08-25 ENCOUNTER — Encounter: Payer: Self-pay | Admitting: Adult Health

## 2021-08-25 ENCOUNTER — Other Ambulatory Visit: Payer: Self-pay

## 2021-08-25 ENCOUNTER — Ambulatory Visit: Payer: PPO | Admitting: Adult Health

## 2021-08-25 VITALS — BP 116/64 | HR 68 | Ht 61.0 in | Wt 200.0 lb

## 2021-08-25 DIAGNOSIS — G629 Polyneuropathy, unspecified: Secondary | ICD-10-CM

## 2021-08-25 DIAGNOSIS — R269 Unspecified abnormalities of gait and mobility: Secondary | ICD-10-CM

## 2021-08-25 NOTE — Progress Notes (Signed)
PATIENT: [name] DOB: April 17, 1951  REASON FOR VISIT: follow up HISTORY FROM: patient  HISTORY OF PRESENT ILLNESS: Today 08/25/21:  [name] is a 71 year old female with a history of Peripheral Neuropathy. She returns today for follow-up. Continues on gabapentin 600 mg twice a day.  She reports that this controls her symptoms.  Occasionally she does have breakthrough symptoms.  She also uses lidocaine patches which she finds beneficial.  She does note that her balance has worsening since the last visit.  Her last fall was in the spring.  She does have a cane that she uses intermittently.  She returns today for an evaluation.  08/25/20: [name] is a 71 year old female with a history of peripheral neuropathy.  She remains on gabapentin 600 mg twice a day.  She states that she continues to have some discomfort in the lower extremities but she feels it is manageable.  She would like to continue to wean off of gabapentin but does not feel she will be able to tolerate the discomfort if she chooses to do so.  The patient denies any significant changes with her gait or balance.  Denies any recent falls.  She does have a cane or walker but only uses it if she feels that she needs it.  She returns today for follow-up.  6/29/21[name] is a 71 year old female with a history of peripheral neuropathy.  She returns today for follow-up.  Reports that she was able to wean herself off of carbamazepine.  She remains on gabapentin 600 mg in the morning and 900 mg at bedtime.  She states that this controls her discomfort.  Reports that she does have numbness and tingling in the lower extremities.  Denies any significant changes with her gait or balance.  No falls.  She returns today for an evaluation.  HISTORY 09/02/18 :   [name] is a 71 year old female with a history of rheumatoid arthritis and peripheral neuropathy.  She returns today for follow-up.  She is currently taking carbamazepine and  gabapentin.  She reports that this is managing her neuropathy pain well.  She states that she still has some numbness and tingling throughout the day but it is manageable.  She denies any significant changes with her gait or balance.  She did have a fall back in November while in [place].  She states that she did break some vertebrae but has seen orthopedist and has since been released.  She states that she will use a cane and walker only for long distances.  She states that she was given lidocaine patches by her rheumatologist for RA.  She reports that she is used those on her feet and legs and it is beneficial for her neuropathy.  She would like [place] to try to refill this to see if it will be approved by her insurance.  Patient also states that gabapentin seems to have decreased her libido.  She expressed that she would like to wean off this medication as long as her pain is under control.  She returns today for evaluation.  REVIEW OF SYSTEMS: Out of a complete 14 system review of symptoms, the patient complains only of the following symptoms, and all other reviewed systems are negative.  See HPI  ALLERGIES: Allergies  Allergen Reactions   Sulfa Antibiotics Hives and Itching    Flushing also   Cymbalta [Duloxetine Hcl] Other (See Comments)    Caused a manic reaction   Demerol Hives and Other (  See Comments)    Fever also   Ivp Dye [Iodinated Contrast Media] Hives and Other (See Comments)    Fever also   Morphine And Related Other (See Comments)    ineffective   Sulfasalazine Other (See Comments)    Reaction ??   Adhesive [Tape] Rash    HOME MEDICATIONS: Outpatient Medications Prior to Visit  Medication Sig Dispense Refill   albuterol (VENTOLIN HFA) 108 (90 Base) MCG/ACT inhaler Inhale 2 puffs into the lungs every 4 (four) hours as needed for wheezing or shortness of breath. 18 g 0   aspirin EC 81 MG tablet Take 81 mg by mouth at bedtime.     cholestyramine (QUESTRAN) 4 GM/DOSE powder Take  by mouth 3 (three) times daily with meals.     cycloSPORINE (RESTASIS) 0.05 % ophthalmic emulsion Place 2 drops into both eyes 2 (two) times daily.     diphenoxylate-atropine (LOMOTIL) 2.5-0.025 MG per tablet Take 1 tablet by mouth 4 (four) times daily as needed for diarrhea or loose stools.     doxylamine, Sleep, (UNISOM) 25 MG tablet Take 1 tablet (25 mg total) by mouth at bedtime as needed. (Patient taking differently: Take 25 mg by mouth at bedtime.) 30 tablet 0   fluticasone (FLONASE) 50 MCG/ACT nasal spray Place 1 spray into both nostrils daily. 16 g 0   gabapentin (NEURONTIN) 300 MG capsule Take 2 capsules (600 mg total) by mouth 2 (two) times daily. 360 capsule 3   hydroxychloroquine (PLAQUENIL) 200 MG tablet Take 200mg  by mouth twice daily, Monday through Friday only. 120 tablet 0   hyoscyamine (ANASPAZ) 0.125 MG TBDP disintergrating tablet Place 0.25 mg under the tongue every 6 (six) hours as needed for cramping.      leflunomide (ARAVA) 20 MG tablet Take 1 tablet (20 mg total) by mouth daily. 90 tablet 0   levocetirizine (XYZAL) 5 MG tablet Take 1 tablet (5 mg total) by mouth every evening. 90 tablet 1   lidocaine (LIDODERM) 5 % PLACE 1 PATCH ONTO THE SKIN DAILY AS NEEDED FOR PAIN. REMOVE AND DISCARD PATCH WITHIN 12 HOURS OR AS DIRECTED BY MD 90 patch 0   metoprolol succinate (TOPROL-XL) 25 MG 24 hr tablet Take 1 tablet (25 mg total) by mouth daily. Pt must keep upcoming appt in Jan, 2023 for further refills 30 tablet 0   NONFORMULARY OR COMPOUNDED ITEM Triamcinolone 0.1% & Silvadene cream 1:1- Apply as directed to affected areas as needed     NONFORMULARY OR COMPOUNDED ITEM      potassium chloride (KLOR-CON) 10 MEQ tablet Take 2 tablets (20 mEq total) by mouth daily. 180 tablet 3   Probiotic Product (PROBIOTIC DAILY PO) Take by mouth. Ultraflora IB Probiotic     promethazine (PHENERGAN) 25 MG tablet TAKE 1 TABLET BY MOUTH EVERY 6 HOURS AS NEEDED. 90 tablet 0   Semaglutide, 1 MG/DOSE, 4  MG/3ML SOPN Inject 1 mg as directed once a week. 3 mL 0   spironolactone (ALDACTONE) 25 MG tablet Take 1 tablet (25 mg total) by mouth 2 (two) times daily. Please keep upcoming appt in December 2022 with Dr. Radford Pax before anymore refills. Thank you Final Attempt (Patient taking differently: Take 25 mg by mouth daily. Please keep upcoming appt in December 2022 with Dr. Radford Pax before anymore refills. Thank you Final Attempt) 180 tablet 0   torsemide 40 MG TABS Take 40 mg by mouth daily. 30 tablet 0   UNABLE TO FIND CPAP: AT bedtime; setting is "12"  No facility-administered medications prior to visit.    PAST MEDICAL HISTORY: Past Medical History:  Diagnosis Date   Arthritis    Rheumatoid arthritis,    Back pain    Blood transfusion    1981   CHF (congestive heart failure) (HCC)    Chronic diastolic CHF (congestive heart failure) (HCC)    diastolic    DDD (degenerative disc disease), cervical    DDD (degenerative disc disease), lumbar    Edema of both lower extremities    Fibromyalgia    Gallbladder problem    GERD (gastroesophageal reflux disease)    Heart murmur    as a child   Hiatal hernia    sjorgens syndrome   High blood pressure    High cholesterol    IBS (irritable bowel syndrome)    Joint pain    Numbness and tingling of both feet    Numbness of fingers    OSA (obstructive sleep apnea)    Osteoarthritis    Peripheral autonomic neuropathy of unknown cause    Pneumonia 07/2018   Polyneuropathy    Pre-diabetes    PVC (premature ventricular contraction)    Raynaud disease    Rheumatoid arthritis ([place])    Sjogren's disease ([place])    Sleep apnea    SOB (shortness of breath)    Swallowing difficulty    Urticaria    Vitamin D deficiency     PAST SURGICAL HISTORY: Past Surgical History:  Procedure Laterality Date   ABDOMINAL HYSTERECTOMY     BTL, BSO   ADENOIDECTOMY     BREAST SURGERY     mass removal    CHOLECYSTECTOMY     dental implants     DIAGNOSTIC  LAPAROSCOPY     x3   KNEE ARTHROSCOPY     x2   MASS EXCISION Left 03/22/2017   Procedure: EXCISION OF LEFT BREAST MASS;  Surgeon: Donnie Mesa, MD;  Location: [facility];  Service: General;  Laterality: Left;   patotid cystectomy     RIGHT/LEFT HEART CATH AND CORONARY ANGIOGRAPHY N/A 07/25/2019   Procedure: RIGHT/LEFT HEART CATH AND CORONARY ANGIOGRAPHY;  Surgeon: Jolaine Artist, MD;  Location: [facility];  Service: Cardiovascular;  Laterality: N/A;   TENDON REPAIR  1980   left ankle and tibia   TONSILLECTOMY     TOTAL KNEE ARTHROPLASTY  06/21/2011   Procedure: TOTAL KNEE ARTHROPLASTY;  Surgeon: Gearlean Alf;  Location: [facility];  Service: Orthopedics;  Laterality: Right;   TUBAL LIGATION      FAMILY HISTORY: Family History  Problem Relation Age of Onset   Alzheimer's disease Mother    Heart attack Mother    Hypertension Mother    Glaucoma Mother    High Cholesterol Mother    Heart disease Mother    Depression Mother    Cancer Father    High Cholesterol Father    Heart disease Father    Sleep apnea Father    Heart attack Brother    Heart disease Brother    Glaucoma Brother    Hyperlipidemia Brother    Glaucoma Brother    Asthma Son    Allergic rhinitis Neg Hx    Angioedema Neg Hx    Eczema Neg Hx    Urticaria Neg Hx     SOCIAL HISTORY: Social History   Socioeconomic History   Marital status: Widowed    Spouse name: Not on file   Number of children: 2   Years of  education: AS   Highest education level: Not on file  Occupational History   Occupation: retired Marine scientist   Occupation: Therapist, sports  Tobacco Use   Smoking status: Never   Smokeless tobacco: Never  Vaping Use   Vaping Use: Never used  Substance and Sexual Activity   Alcohol use: Yes    Comment: socially    Drug use: No   Sexual activity: Never  Other Topics Concern   Not on file  Social History Narrative   Lives at home alone   Right-handed   Drinks 1 or less cups of coffee and 2 servings of  either tea or soda per day   Social Determinants of Health   Financial Resource Strain: Not on file  Food Insecurity: Not on file  Transportation Needs: Not on file  Physical Activity: Not on file  Stress: Not on file  Social Connections: Not on file  Intimate Partner Violence: Not on file      PHYSICAL EXAM  Vitals:   08/25/21 1259  BP: 116/64  Pulse: 68  Weight: 200 lb (90.7 kg)  Height: 5\' 1"  (1.549 m)    Body mass index is 37.79 kg/m.  Generalized: Well developed, in no acute distress   Neurological examination  Mentation: Alert oriented to time, place, history taking. Follows all commands speech and language fluent Cranial nerve II-XII: Pupils were equal round reactive to light. Extraocular movements were full, visual field were full on confrontational test. Facial sensation and strength were normal. Uvula tongue midline. Head turning and shoulder shrug  were normal and symmetric. Motor: The motor testing reveals 5 over 5 strength of all 4 extremities. Good symmetric motor tone is noted throughout.  Sensory: Sensory testing is intact to soft touch on all 4 extremities. No evidence of extinction is noted.  Coordination: Cerebellar testing reveals good finger-nose-finger and heel-to-shin bilaterally.  Gait and station: Gait is slightly wide-based- toes turned out when ambulating Reflexes: Deep tendon reflexes are symmetric and normal bilaterally.   DIAGNOSTIC DATA (LABS, IMAGING, TESTING) - I reviewed patient records, labs, notes, testing and imaging myself where available.  Lab Results  Component Value Date   WBC 7.4 05/26/2021   HGB 14.1 05/26/2021   HCT 42.6 05/26/2021   MCV 87.1 05/26/2021   PLT 219 05/26/2021      Component Value Date/Time   NA 139 02/24/2021 1133   K 4.6 02/24/2021 1133   CL 103 02/24/2021 1133   CO2 22 02/24/2021 1133   GLUCOSE 85 02/24/2021 1133   GLUCOSE 129 (H) 02/10/2020 1222   BUN 25 02/24/2021 1133   CREATININE 1.43 (H)  02/24/2021 1133   CREATININE 1.24 (H) 02/10/2020 1222   CALCIUM 9.7 02/24/2021 1133   PROT 6.3 01/31/2021 1406   ALBUMIN 4.1 01/31/2021 1406   AST 16 01/31/2021 1406   ALT 15 01/31/2021 1406   ALKPHOS 143 (H) 01/31/2021 1406   BILITOT 0.2 01/31/2021 1406   GFRNONAA 50 (L) 09/23/2020 1627   GFRNONAA 45 (L) 02/10/2020 1222   GFRAA 58 (L) 09/23/2020 1627   GFRAA 52 (L) 02/10/2020 1222   Lab Results  Component Value Date   CHOL 167 01/31/2021   HDL 39 (L) 01/31/2021   LDLCALC 86 01/31/2021   TRIG 253 (H) 01/31/2021   CHOLHDL 3.5 08/26/2019   Lab Results  Component Value Date   HGBA1C 5.2 01/31/2021   Lab Results  Component Value Date   VITAMINB12 491 07/02/2017   Lab Results  Component Value Date  TSH 1.550 12/01/2019      ASSESSMENT AND PLAN 71 y.o. year old female  has a past medical history of Arthritis, Back pain, Blood transfusion, CHF (congestive heart failure) (HCC), Chronic diastolic CHF (congestive heart failure) ([place]), DDD (degenerative disc disease), cervical, DDD (degenerative disc disease), lumbar, Edema of both lower extremities, Fibromyalgia, Gallbladder problem, GERD (gastroesophageal reflux disease), Heart murmur, Hiatal hernia, High blood pressure, High cholesterol, IBS (irritable bowel syndrome), Joint pain, Numbness and tingling of both feet, Numbness of fingers, OSA (obstructive sleep apnea), Osteoarthritis, Peripheral autonomic neuropathy of unknown cause, Pneumonia (07/2018), Polyneuropathy, Pre-diabetes, PVC (premature ventricular contraction), Raynaud disease, Rheumatoid arthritis ([place]), Sjogren's disease ([place]), Sleep apnea, SOB (shortness of breath), Swallowing difficulty, Urticaria, and Vitamin D deficiency. here with:  Peripheral neuropathy Gait and Balance abnormality   Continue gabapentin-600 mg twice a day  Referral to PT advised if symptoms worsen or she develops new symptoms she should let us know Follow-up in 1 year or sooner if  needed     Ward Givens, MSN, NP-C 08/25/2021, 12:58 PM [facility] [address] [place], [place] [address] [phone]

## 2021-08-25 NOTE — Progress Notes (Signed)
Chief Complaint:   OBESITY [name] is here to discuss her progress with her obesity treatment plan along with follow-up of her obesity related diagnoses. [name] is on the Category 2 Plan and the Category 3 Plan and states she is following her eating plan approximately 70% of the time. [name] states she is walking 1 mile 3-4 times per week.  Today's visit was #: 28 Starting weight: 239 lbs Starting date: 12/01/2019 Today's weight: 192 lbs Today's date: 08/23/2021 Total lbs lost to date: 47 Total lbs lost since last in-office visit: 0  Interim History: Pt had a good holiday- went to her son's and a friend's house. She had a few gatherings with friends. She is not always getting all food in but focused on getting protein in daily. Pt notes decreased carb cravings and low carb intake. She has started drinking soda again. She recognizes she may need to increase protein intake.  Subjective:   1. Allergic rhinitis due to other allergic trigger, unspecified seasonality [name] is on Xyzal with good control of symptoms. She take Xyzal daily.  2. Elevated random blood glucose level Pt is on Ozempic with good control of symptoms. She is doing well on Ozempic with no GI side effects.  Assessment/Plan:   1. Allergic rhinitis due to other allergic trigger, unspecified seasonality Continue current treatment plan.  Refill- levocetirizine (XYZAL) 5 MG tablet; Take 1 tablet (5 mg total) by mouth every evening.  Dispense: 90 tablet; Refill: 1  2. Elevated random blood glucose level Continue current treatment plan.  Refill- Semaglutide, 1 MG/DOSE, 4 MG/3ML SOPN; Inject 1 mg as directed once a week.  Dispense: 3 mL; Refill: 0  3. Obesity with current BMI of 36.3  [name] is currently in the action stage of change. As such, her goal is to continue with weight loss efforts. She has agreed to the Category 3 Plan and keeping a food journal and adhering to recommended goals of 1450-1600 calories and 90+ grams  protein.   Exercise goals:  Pt is to start going to the [facility] more consistently.  Behavioral modification strategies: increasing lean protein intake, meal planning and cooking strategies, planning for success, and keeping a strict food journal.  [name] has agreed to follow-up with our clinic in 3-4 weeks. She was informed of the importance of frequent follow-up visits to maximize her success with intensive lifestyle modifications for her multiple health conditions.   Objective:   Blood pressure 118/81, pulse 69, temperature 98.2 F (36.8 C), height 5\' 1"  (1.549 m), weight 192 lb (87.1 kg), SpO2 98 %. Body mass index is 36.28 kg/m.  General: Cooperative, alert, well developed, in no acute distress. HEENT: Conjunctivae and lids unremarkable. Cardiovascular: Regular rhythm.  Lungs: Normal work of breathing. Neurologic: No focal deficits.   Lab Results  Component Value Date   CREATININE 1.43 (H) 02/24/2021   BUN 25 02/24/2021   NA 139 02/24/2021   K 4.6 02/24/2021   CL 103 02/24/2021   CO2 22 02/24/2021   Lab Results  Component Value Date   ALT 15 01/31/2021   AST 16 01/31/2021   ALKPHOS 143 (H) 01/31/2021   BILITOT 0.2 01/31/2021   Lab Results  Component Value Date   HGBA1C 5.2 01/31/2021   HGBA1C 5.5 09/23/2020   HGBA1C 5.8 (H) 05/11/2020   HGBA1C 6.2 (H) 12/01/2019   HGBA1C 6.1 (H) 08/26/2019   Lab Results  Component Value Date   INSULIN 14.2 01/31/2021   INSULIN 25.4 (H) 09/23/2020  INSULIN 21.0 06/01/2020   INSULIN 32.3 (H) 12/01/2019   Lab Results  Component Value Date   TSH 1.550 12/01/2019   Lab Results  Component Value Date   CHOL 167 01/31/2021   HDL 39 (L) 01/31/2021   LDLCALC 86 01/31/2021   TRIG 253 (H) 01/31/2021   CHOLHDL 3.5 08/26/2019   Lab Results  Component Value Date   VD25OH 92.2 09/23/2020   VD25OH 62.9 12/01/2019   VD25OH 61.3 08/26/2019   Lab Results  Component Value Date   WBC 7.4 05/26/2021   HGB 14.1 05/26/2021   HCT 42.6  05/26/2021   MCV 87.1 05/26/2021   PLT 219 05/26/2021    Attestation Statements:   Reviewed by clinician on day of visit: allergies, medications, problem list, medical history, surgical history, family history, social history, and previous encounter notes.  Coral Ceo, CMA, am acting as transcriptionist for Coralie Common, MD.   I have reviewed the above documentation for accuracy and completeness, and I agree with the above. - Coralie Common, MD

## 2021-08-26 ENCOUNTER — Ambulatory Visit (INDEPENDENT_AMBULATORY_CARE_PROVIDER_SITE_OTHER): Payer: PPO

## 2021-08-26 ENCOUNTER — Telehealth: Payer: Self-pay

## 2021-08-26 ENCOUNTER — Encounter: Payer: Self-pay | Admitting: Cardiology

## 2021-08-26 ENCOUNTER — Other Ambulatory Visit: Payer: Self-pay

## 2021-08-26 ENCOUNTER — Telehealth (INDEPENDENT_AMBULATORY_CARE_PROVIDER_SITE_OTHER): Payer: PPO | Admitting: Cardiology

## 2021-08-26 VITALS — BP 106/60 | HR 70 | Ht 61.0 in | Wt 192.0 lb

## 2021-08-26 DIAGNOSIS — G4733 Obstructive sleep apnea (adult) (pediatric): Secondary | ICD-10-CM | POA: Diagnosis not present

## 2021-08-26 DIAGNOSIS — R001 Bradycardia, unspecified: Secondary | ICD-10-CM | POA: Diagnosis not present

## 2021-08-26 DIAGNOSIS — I5032 Chronic diastolic (congestive) heart failure: Secondary | ICD-10-CM | POA: Diagnosis not present

## 2021-08-26 DIAGNOSIS — I493 Ventricular premature depolarization: Secondary | ICD-10-CM

## 2021-08-26 DIAGNOSIS — I10 Essential (primary) hypertension: Secondary | ICD-10-CM

## 2021-08-26 NOTE — Progress Notes (Signed)
Virtual Visit via Video Note   This visit type was conducted due to national recommendations for restrictions regarding the COVID-19 Pandemic (e.g. social distancing) in an effort to limit this patient's exposure and mitigate transmission in our community.  Due to her co-morbid illnesses, this patient is at least at moderate risk for complications without adequate follow up.  This format is felt to be most appropriate for this patient at this time.  All issues noted in this document were discussed and addressed.  A limited physical exam was performed with this format.  Please refer to the patient's chart for her consent to telehealth for [name].     Date:  08/26/2021   ID:  [name], DOB 1950-09-17, MRN [id number] The patient was identified using 2 identifiers.  Patient Location: Home Provider Location: Home Office   PCP:  Lorrene Reid, PA-C   [place] [facility] Cardiologist:  Fransico Him, MD     Evaluation Performed:  Follow-Up Visit  Chief Complaint:  CHF, OSA  History of Present Illness:    [name] is a 71 y.o. female with a hx of Chronic diastolic CHF, normal coronary arteries with normal filling pressures in 07/2019, chronic DOE related to obesity and sedentary state (evaluated in [facility] clinic and felt that sx were related to this and now so much CHF), morbid obesity and OSA.     She is here today for followup and is doing well.  SHe denies any chest pain or pressure, SOB, DOE, PND, orthopnea, LE edema, dizziness, palpitations or syncope. She is compliant with her meds and is tolerating meds with no SE.     The patient does not have symptoms concerning for COVID-19 infection (fever, chills, cough, or new shortness of breath).    Past Medical History:  Diagnosis Date   Arthritis    Rheumatoid arthritis,    Back pain    Blood transfusion    1981   CHF (congestive heart failure) (HCC)    Chronic diastolic CHF (congestive heart failure) (HCC)     diastolic    DDD (degenerative disc disease), cervical    DDD (degenerative disc disease), lumbar    Edema of both lower extremities    Fibromyalgia    Gallbladder problem    GERD (gastroesophageal reflux disease)    Heart murmur    as a child   Hiatal hernia    sjorgens syndrome   High blood pressure    High cholesterol    IBS (irritable bowel syndrome)    Joint pain    Numbness and tingling of both feet    Numbness of fingers    OSA (obstructive sleep apnea)    Osteoarthritis    Peripheral autonomic neuropathy of unknown cause    Pneumonia 07/2018   Polyneuropathy    Pre-diabetes    PVC (premature ventricular contraction)    Raynaud disease    Rheumatoid arthritis (HCC)    Sjogren's disease (HCC)    Sleep apnea    SOB (shortness of breath)    Swallowing difficulty    Urticaria    Vitamin D deficiency    Past Surgical History:  Procedure Laterality Date   ABDOMINAL HYSTERECTOMY     BTL, BSO   ADENOIDECTOMY     BREAST SURGERY     mass removal    CHOLECYSTECTOMY     dental implants     DIAGNOSTIC LAPAROSCOPY     x3   KNEE ARTHROSCOPY  x2   MASS EXCISION Left 03/22/2017   Procedure: EXCISION OF LEFT BREAST MASS;  Surgeon: Donnie Mesa, MD;  Location: [facility];  Service: General;  Laterality: Left;   patotid cystectomy     RIGHT/LEFT HEART CATH AND CORONARY ANGIOGRAPHY N/A 07/25/2019   Procedure: RIGHT/LEFT HEART CATH AND CORONARY ANGIOGRAPHY;  Surgeon: Jolaine Artist, MD;  Location: [facility];  Service: Cardiovascular;  Laterality: N/A;   TENDON REPAIR  1980   left ankle and tibia   TONSILLECTOMY     TOTAL KNEE ARTHROPLASTY  06/21/2011   Procedure: TOTAL KNEE ARTHROPLASTY;  Surgeon: Gearlean Alf;  Location: [facility];  Service: Orthopedics;  Laterality: Right;   TUBAL LIGATION       Current Meds  Medication Sig   albuterol (VENTOLIN HFA) 108 (90 Base) MCG/ACT inhaler Inhale 2 puffs into the lungs every 4 (four) hours as needed for wheezing  or shortness of breath.   aspirin EC 81 MG tablet Take 81 mg by mouth at bedtime.   cholestyramine (QUESTRAN) 4 GM/DOSE powder Take by mouth 3 (three) times daily with meals. As needed   cycloSPORINE (RESTASIS) 0.05 % ophthalmic emulsion Place 2 drops into both eyes 2 (two) times daily.   diphenoxylate-atropine (LOMOTIL) 2.5-0.025 MG per tablet Take 1 tablet by mouth 4 (four) times daily as needed for diarrhea or loose stools.   doxylamine, Sleep, (UNISOM) 25 MG tablet Take 1 tablet (25 mg total) by mouth at bedtime as needed.   fluticasone (FLONASE) 50 MCG/ACT nasal spray Place 1 spray into both nostrils daily.   gabapentin (NEURONTIN) 300 MG capsule Take 2 capsules (600 mg total) by mouth 2 (two) times daily.   hydroxychloroquine (PLAQUENIL) 200 MG tablet Take 200mg  by mouth twice daily, Monday through Friday only.   hyoscyamine (ANASPAZ) 0.125 MG TBDP disintergrating tablet Place 0.25 mg under the tongue every 6 (six) hours as needed for cramping.    leflunomide (ARAVA) 20 MG tablet Take 1 tablet (20 mg total) by mouth daily.   levocetirizine (XYZAL) 5 MG tablet Take 1 tablet (5 mg total) by mouth every evening.   lidocaine (LIDODERM) 5 % PLACE 1 PATCH ONTO THE SKIN DAILY AS NEEDED FOR PAIN. REMOVE AND DISCARD PATCH WITHIN 12 HOURS OR AS DIRECTED BY MD   metoprolol succinate (TOPROL-XL) 25 MG 24 hr tablet Take 12.5 mg by mouth daily.   NONFORMULARY OR COMPOUNDED ITEM Triamcinolone 0.1% & Silvadene cream 1:1- Apply as directed to affected areas as needed   potassium chloride (KLOR-CON) 10 MEQ tablet Take 2 tablets (20 mEq total) by mouth daily.   Probiotic Product (PROBIOTIC DAILY PO) Take by mouth. Ultraflora IB Probiotic   promethazine (PHENERGAN) 25 MG tablet TAKE 1 TABLET BY MOUTH EVERY 6 HOURS AS NEEDED.   Semaglutide, 1 MG/DOSE, 4 MG/3ML SOPN Inject 1 mg as directed once a week.   spironolactone (ALDACTONE) 25 MG tablet Take 25 mg by mouth daily.   torsemide 40 MG TABS Take 40 mg by mouth  daily.     Allergies:   Sulfa antibiotics, Cymbalta [duloxetine hcl], Demerol, Ivp dye [iodinated contrast media], Morphine and related, Sulfasalazine, and Adhesive [tape]   Social History   Tobacco Use   Smoking status: Never   Smokeless tobacco: Never  Vaping Use   Vaping Use: Never used  Substance Use Topics   Alcohol use: Yes    Comment: socially    Drug use: No     Family Hx: The patient's family history  includes Alzheimer's disease in her mother; Asthma in her son; Cancer in her father; Depression in her mother; Glaucoma in her brother, brother, and mother; Heart attack in her brother and mother; Heart disease in her brother, father, and mother; High Cholesterol in her father and mother; Hyperlipidemia in her brother; Hypertension in her mother; Sleep apnea in her father. There is no history of Allergic rhinitis, Angioedema, Eczema, Urticaria, or Neuropathy.  ROS:   Please see the history of present illness.     All other systems reviewed and are negative.   Prior CV studies:   The following studies were reviewed today:  none  Labs/Other Tests and Data Reviewed:    EKG:  No ECG reviewed.  Recent Labs: 01/31/2021: ALT 15 02/24/2021: BUN 25; Creatinine, Ser 1.43; Potassium 4.6; Sodium 139 05/26/2021: Hemoglobin 14.1; Platelets 219   Recent Lipid Panel Lab Results  Component Value Date/Time   CHOL 167 01/31/2021 02:06 PM   TRIG 253 (H) 01/31/2021 02:06 PM   HDL 39 (L) 01/31/2021 02:06 PM   CHOLHDL 3.5 08/26/2019 10:48 AM   CHOLHDL 4.7 08/11/2016 10:17 AM   LDLCALC 86 01/31/2021 02:06 PM    Wt Readings from Last 3 Encounters:  08/26/21 192 lb (87.1 kg)  08/25/21 200 lb (90.7 kg)  08/23/21 192 lb (87.1 kg)     Risk Assessment/Calculations:          Objective:    Vital Signs:  BP 106/60    Pulse 70    Ht 5\' 1"  (1.549 m)    Wt 192 lb (87.1 kg)    BMI 36.28 kg/m    VITAL SIGNS:  reviewed GEN:  no acute distress EYES:  sclerae anicteric, EOMI -  Extraocular Movements Intact RESPIRATORY:  normal respiratory effort, symmetric expansion CARDIOVASCULAR:  no peripheral edema SKIN:  no rash, lesions or ulcers. MUSCULOSKELETAL:  no obvious deformities. NEURO:  alert and oriented x 3, no obvious focal deficit PSYCH:  normal affect  ASSESSMENT & PLAN:     OSA  -she stopped using CPAP last Spring when she got a boyfriend and started traveling and says that she did not want to use it and feels fine sleeping well at night and denies any excessive daytime sleepiness or snoring -I explained the relationship between CHF and OSA -I will get a HST with It[facility] to reassess degree of OSA.   Chronic diastolic CHF -evaluated in [facility] clinic and felt that chronic SOB related to morbid obesity and sedentary state and recommended a goal of weight loss as her high school weight + 15lbs for her dry weight.   -normal coronary arteries and filling pressures on cath 07/2019 -Chronic shortness of breath and LE edema appears stable and has not really had any SOB recently -Weights seem a little bit off as she weighed 192 pounds on 08/23/2021 and now says she weighs 200 pounds.  Her weight on 07/05/2021 was 190 pounds as well. -Continue prescription drug management with torsemide 40 mg daily and spironolactone 25 mg daily with as needed refills -she has CKD followed by nephrology   HTN -BP is well controlled on exam today -Continue prescription drug management with Toprol-XL 12.5 mg daily, spironolactone 25 mg daily with as needed refills     PVCs -she has occasional palpitations but are very sporadic and well tolerated  -continue low dose BB>>her nephrologist does not want to push the BB due to her underlying renal disease -she says that she has had her HR register in  the 40's at times but does not feel bad except a need to take a deep breath and I suspect that it is the PVCs -I will get a 2 week Ziopatch to assess for bradycardia further   COVID-19  Education: The signs and symptoms of COVID-19 were discussed with the patient and how to seek care for testing (follow up with PCP or arrange E-visit).  The importance of social distancing was discussed today.  Time:   Today, I have spent 20 minutes with the patient with telehealth technology discussing the above problems.     Medication Adjustments/Labs and Tests Ordered: Current medicines are reviewed at length with the patient today.  Concerns regarding medicines are outlined above.   Tests Ordered: No orders of the defined types were placed in this encounter.   Medication Changes: No orders of the defined types were placed in this encounter.   Follow Up:  In Person in 1 year(s)  Signed, Fransico Him, MD  08/26/2021 2:12 PM    [facility]

## 2021-08-26 NOTE — Progress Notes (Unsigned)
Enrolled patient for a 14 day Zio XT  monitor to be mailed to patients home  °

## 2021-08-26 NOTE — Telephone Encounter (Signed)
[name] sleep study was ordered by Dr. Radford Pax today. Called [name] to see when she could come by the office so that we could give her a sleep study and go over instructions. Left message for [name] to call back.

## 2021-08-26 NOTE — Addendum Note (Signed)
Addended by: Antonieta Iba on: 08/26/2021 02:27 PM   Modules accepted: Orders

## 2021-08-26 NOTE — Patient Instructions (Signed)
Medication Instructions:  Your physician recommends that you continue on your current medications as directed. Please refer to the Current Medication list given to you today.  *If you need a refill on your cardiac medications before your next appointment, please call your pharmacy*  Testing/Procedures: Your physician has recommended that you have a sleep study. This test records several body functions during sleep, including: brain activity, eye movement, oxygen and carbon dioxide blood levels, heart rate and rhythm, breathing rate and rhythm, the flow of air through your mouth and nose, snoring, body muscle movements, and chest and belly movement.  Your physician has recommended that you wear an event monitor. Event monitors are medical devices that record the hearts electrical activity. Doctors most often [place] these monitors to diagnose arrhythmias. Arrhythmias are problems with the speed or rhythm of the heartbeat. The monitor is a small, portable device. You can wear one while you do your normal daily activities. This is usually used to diagnose what is causing palpitations/syncope (passing out).  Follow-Up: At [facility], you and your health needs are our priority.  As part of our continuing mission to provide you with exceptional heart care, we have created designated Provider Care Teams.  These Care Teams include your primary Cardiologist (physician) and Advanced Practice Providers (APPs -  Physician Assistants and Nurse Practitioners) who all work together to provide you with the care you need, when you need it.  Your next appointment:   1 year(s)  The format for your next appointment:   In Person  Provider:   Fransico Him, MD     Other Instructions:  [facility]ryn Gulling- Long Term Monitor Instructions  Your physician has requested you wear a ZIO patch monitor for 14 days.  This is a single patch monitor. [employer] supplies one patch monitor per enrollment. Additional stickers are not  available. Please do not apply patch if you will be having a Nuclear Stress Test,  Echocardiogram, Cardiac CT, MRI, or Chest Xray during the period you would be wearing the  monitor. The patch cannot be worn during these tests. You cannot remove and re-apply the  ZIO XT patch monitor.  Your ZIO patch monitor will be mailed 3 day USPS to your address on file. It may take 3-5 days  to receive your monitor after you have been enrolled.  Once you have received your monitor, please review the enclosed instructions. Your monitor  has already been registered assigning a specific monitor serial # to you.  Billing and Patient Assistance Program Information  We have supplied [employer] with any of your insurance information on file for billing purposes. [employer] offers a sliding scale Patient Assistance Program for patients that do not have  insurance, or whose insurance does not completely cover the cost of the ZIO monitor.  You must apply for the Patient Assistance Program to qualify for this discounted rate.  To apply, please call [employer] at [phone], select option 4, select option 2, ask to apply for  Patient Assistance Program. Theodore Demark will ask your household income, and how many people  are in your household. They will quote your out-of-pocket cost based on that information.  [employer] will also be able to set up a 69-month, interest-free payment plan if needed.  Applying the monitor   Shave hair from upper left chest.  Hold abrader disc by orange tab. Rub abrader in 40 strokes over the upper left chest as  indicated in your monitor instructions.  Clean area with 4 enclosed alcohol pads.  Let dry.  Apply patch as indicated in monitor instructions. Patch will be placed under collarbone on left  side of chest with arrow pointing upward.  Rub patch adhesive wings for 2 minutes. Remove white label marked "1". Remove the white  label marked "2". Rub patch adhesive wings for 2 additional minutes.   While looking in a mirror, press and release button in center of patch. A small green light will  flash 3-4 times. This will be your only indicator that the monitor has been turned on.  Do not shower for the first 24 hours. You may shower after the first 24 hours.  Press the button if you feel a symptom. You will hear a small click. Record Date, Time and  Symptom in the Patient Logbook.  When you are ready to remove the patch, follow instructions on the last 2 pages of Patient  Logbook. Stick patch monitor onto the last page of Patient Logbook.  Place Patient Logbook in the blue and white box. Use locking tab on box and tape box closed  securely. The blue and white box has prepaid postage on it. Please place it in the mailbox as  soon as possible. Your physician should have your test results approximately 7 days after the  monitor has been mailed back to [facility].  Call [place] at [phone] if you have questions regarding  your ZIO XT patch monitor. Call them immediately if you see an orange light blinking on your  monitor.  If your monitor falls off in less than 4 days, contact our Monitor department at [phone].  If your monitor becomes loose or falls off after 4 days call [employer] at [phone] for  suggestions on securing your monitor

## 2021-08-26 NOTE — Telephone Encounter (Signed)
Spoke with the patient and she will come by the office on Tuesday afternoon to pick up her sleep study.

## 2021-08-30 NOTE — Telephone Encounter (Signed)
Pt did not come by the office today to pick up her [facility] sleep study and to set up application. I will inform Dr. Theodosia Blender nurse.

## 2021-08-31 ENCOUNTER — Ambulatory Visit: Payer: Self-pay

## 2021-08-31 ENCOUNTER — Other Ambulatory Visit: Payer: Self-pay

## 2021-08-31 ENCOUNTER — Encounter: Payer: Self-pay | Admitting: Rheumatology

## 2021-08-31 ENCOUNTER — Ambulatory Visit: Payer: PPO | Admitting: Rheumatology

## 2021-08-31 VITALS — BP 121/74 | HR 80 | Ht 61.0 in | Wt 198.6 lb

## 2021-08-31 DIAGNOSIS — G8929 Other chronic pain: Secondary | ICD-10-CM

## 2021-08-31 DIAGNOSIS — Z79899 Other long term (current) drug therapy: Secondary | ICD-10-CM | POA: Diagnosis not present

## 2021-08-31 DIAGNOSIS — M3500 Sicca syndrome, unspecified: Secondary | ICD-10-CM

## 2021-08-31 DIAGNOSIS — M5136 Other intervertebral disc degeneration, lumbar region: Secondary | ICD-10-CM | POA: Diagnosis not present

## 2021-08-31 DIAGNOSIS — Z8659 Personal history of other mental and behavioral disorders: Secondary | ICD-10-CM

## 2021-08-31 DIAGNOSIS — M1712 Unilateral primary osteoarthritis, left knee: Secondary | ICD-10-CM

## 2021-08-31 DIAGNOSIS — I7300 Raynaud's syndrome without gangrene: Secondary | ICD-10-CM

## 2021-08-31 DIAGNOSIS — M810 Age-related osteoporosis without current pathological fracture: Secondary | ICD-10-CM

## 2021-08-31 DIAGNOSIS — Z8639 Personal history of other endocrine, nutritional and metabolic disease: Secondary | ICD-10-CM

## 2021-08-31 DIAGNOSIS — M533 Sacrococcygeal disorders, not elsewhere classified: Secondary | ICD-10-CM

## 2021-08-31 DIAGNOSIS — M25511 Pain in right shoulder: Secondary | ICD-10-CM

## 2021-08-31 DIAGNOSIS — M51369 Other intervertebral disc degeneration, lumbar region without mention of lumbar back pain or lower extremity pain: Secondary | ICD-10-CM

## 2021-08-31 DIAGNOSIS — Z96651 Presence of right artificial knee joint: Secondary | ICD-10-CM

## 2021-08-31 DIAGNOSIS — M0579 Rheumatoid arthritis with rheumatoid factor of multiple sites without organ or systems involvement: Secondary | ICD-10-CM | POA: Diagnosis not present

## 2021-08-31 DIAGNOSIS — M797 Fibromyalgia: Secondary | ICD-10-CM

## 2021-08-31 DIAGNOSIS — Z8669 Personal history of other diseases of the nervous system and sense organs: Secondary | ICD-10-CM

## 2021-08-31 DIAGNOSIS — Z8719 Personal history of other diseases of the digestive system: Secondary | ICD-10-CM

## 2021-08-31 DIAGNOSIS — Z8679 Personal history of other diseases of the circulatory system: Secondary | ICD-10-CM

## 2021-08-31 DIAGNOSIS — M3501 Sicca syndrome with keratoconjunctivitis: Secondary | ICD-10-CM

## 2021-08-31 DIAGNOSIS — K200 Eosinophilic esophagitis: Secondary | ICD-10-CM

## 2021-08-31 DIAGNOSIS — N184 Chronic kidney disease, stage 4 (severe): Secondary | ICD-10-CM

## 2021-08-31 LAB — COMPLETE METABOLIC PANEL WITH GFR
AG Ratio: 1.7 (calc) (ref 1.0–2.5)
ALT: 17 U/L (ref 6–29)
AST: 20 U/L (ref 10–35)
Albumin: 4 g/dL (ref 3.6–5.1)
Alkaline phosphatase (APISO): 97 U/L (ref 37–153)
BUN/Creatinine Ratio: 19 (calc) (ref 6–22)
BUN: 23 mg/dL (ref 7–25)
CO2: 29 mmol/L (ref 20–32)
Calcium: 9.4 mg/dL (ref 8.6–10.4)
Chloride: 104 mmol/L (ref 98–110)
Creat: 1.23 mg/dL — ABNORMAL HIGH (ref 0.50–1.05)
Globulin: 2.4 g/dL (calc) (ref 1.9–3.7)
Glucose, Bld: 89 mg/dL (ref 65–99)
Potassium: 4.4 mmol/L (ref 3.5–5.3)
Sodium: 141 mmol/L (ref 135–146)
Total Bilirubin: 0.3 mg/dL (ref 0.2–1.2)
Total Protein: 6.4 g/dL (ref 6.1–8.1)
eGFR: 48 mL/min/{1.73_m2} — ABNORMAL LOW (ref >=60)

## 2021-08-31 LAB — CBC WITH DIFFERENTIAL/PLATELET
Absolute Monocytes: 693 cells/uL (ref 200–950)
Basophils Absolute: 77 cells/uL (ref 0–200)
Basophils Relative: 1 %
Eosinophils Absolute: 123 cells/uL (ref 15–500)
Eosinophils Relative: 1.6 %
HCT: 40.5 % (ref 35.0–45.0)
Hemoglobin: 13.5 g/dL (ref 11.7–15.5)
Lymphs Abs: 1117 cells/uL (ref 850–3900)
MCH: 28.5 pg (ref 27.0–33.0)
MCHC: 33.3 g/dL (ref 32.0–36.0)
MCV: 85.6 fL (ref 80.0–100.0)
MPV: 12.1 fL (ref 7.5–12.5)
Monocytes Relative: 9 %
Neutro Abs: 5690 cells/uL (ref 1500–7800)
Neutrophils Relative %: 73.9 %
Platelets: 193 10*3/uL (ref 140–400)
RBC: 4.73 10*6/uL (ref 3.80–5.10)
RDW: 13.4 % (ref 11.0–15.0)
Total Lymphocyte: 14.5 %
WBC: 7.7 10*3/uL (ref 3.8–10.8)

## 2021-08-31 NOTE — Patient Instructions (Addendum)
Standing Labs We placed an order today for your standing lab work.   Please have your standing labs drawn in April and every 3 months  If possible, please have your labs drawn 2 weeks prior to your appointment so that the provider can discuss your results at your appointment.  Please note that you may see your imaging and lab results in [facility] before we have reviewed them. We may be awaiting multiple results to interpret others before contacting you. Please allow our office up to 72 hours to thoroughly review all of the results before contacting the office for clarification of your results.  We have open lab daily: Monday through Thursday from 1:30-4:30 PM and Friday from 1:30-4:00 PM at the office of Dr. Bo Merino, [facility].   Please be advised, all patients with office appointments requiring lab work will take precedent over walk-in lab work.  If possible, please come for your lab work on Monday and Friday afternoons, as you may experience shorter wait times. The office is located at [address] [place], [place], [place] [address] No appointment is necessary.   Labs are drawn by [facility]. Please bring your co-pay at the time of your lab draw.  You may receive a bill from [facility] for your lab work.  Please note if you are on Hydroxychloroquine and and an order has been placed for a Hydroxychloroquine level, you will need to have it drawn 4 hours or more after your last dose.  If you wish to have your labs drawn at another location, please call the office 24 hours in advance to send orders.  If you have any questions regarding directions or hours of operation,  please call [phone].   As a reminder, please drink plenty of water prior to coming for your lab work. Thanks!   Vaccines You are taking a medication(s) that can suppress your immune system.  The following immunizations are recommended: Flu annually Covid-19  Td/Tdap (tetanus, diphtheria, pertussis)  every 10 years Pneumonia (Prevnar 15 then Pneumovax 23 at least 1 year apart.  Alternatively, can take Prevnar 20 without needing additional dose) Shingrix: 2 doses from 4 weeks to 6 months apart  Please check with your PCP to make sure you are up to date.   If you have signs or symptoms of an infection or start antibiotics: First, call your PCP for workup of your infection. Hold your medication through the infection, until you complete your antibiotics, and until symptoms resolve if you take the following: Injectable medication (Actemra, Benlysta, Cimzia, Cosentyx, Enbrel, Humira, Kevzara, Orencia, Remicade, Simponi, Stelara, Taltz, Tremfya) Methotrexate Leflunomide (Arava) Mycophenolate (Cellcept) Roma Kayser, or Rinvoq  Shoulder Exercises Ask your health care provider which exercises are safe for you. Do exercises exactly as told by your health care provider and adjust them as directed. It is normal to feel mild stretching, pulling, tightness, or discomfort as you do these exercises. Stop right away if you feel sudden pain or your pain gets worse. Do not begin these exercises until told by your health care provider. Stretching exercises External rotation and abduction This exercise is sometimes called corner stretch. This exercise rotates your arm outward (external rotation) and moves your arm out from your body (abduction). Stand in a doorway with one of your feet slightly in front of the other. This is called a staggered stance. If you cannot reach your forearms to the door frame, stand facing a corner of a room. Choose one of the following positions  as told by your health care provider: Place your hands and forearms on the door frame above your head. Place your hands and forearms on the door frame at the height of your head. Place your hands on the door frame at the height of your elbows. Slowly move your weight onto your front foot until you feel a stretch across your chest and  in the front of your shoulders. Keep your head and chest upright and keep your abdominal muscles tight. Hold for __________ seconds. To release the stretch, shift your weight to your back foot. Repeat __________ times. Complete this exercise __________ times a day. Extension, standing Stand and hold a broomstick, a cane, or a similar object behind your back. Your hands should be a little wider than shoulder width apart. Your palms should face away from your back. Keeping your elbows straight and your shoulder muscles relaxed, move the stick away from your body until you feel a stretch in your shoulders (extension). Avoid shrugging your shoulders while you move the stick. Keep your shoulder blades tucked down toward the middle of your back. Hold for __________ seconds. Slowly return to the starting position. Repeat __________ times. Complete this exercise __________ times a day. Range-of-motion exercises Pendulum  Stand near a wall or a surface that you can hold onto for balance. Bend at the waist and let your left / right arm hang straight down. Use your other arm to support you. Keep your back straight and do not lock your knees. Relax your left / right arm and shoulder muscles, and move your hips and your trunk so your left / right arm swings freely. Your arm should swing because of the motion of your body, not because you are using your arm or shoulder muscles. Keep moving your hips and trunk so your arm swings in the following directions, as told by your health care provider: Side to side. Forward and backward. In clockwise and counterclockwise circles. Continue each motion for __________ seconds, or for as long as told by your health care provider. Slowly return to the starting position. Repeat __________ times. Complete this exercise __________ times a day. Shoulder flexion, standing  Stand and hold a broomstick, a cane, or a similar object. Place your hands a little more than  shoulder width apart on the object. Your left / right hand should be palm up, and your other hand should be palm down. Keep your elbow straight and your shoulder muscles relaxed. Push the stick up with your healthy arm to raise your left / right arm in front of your body, and then over your head until you feel a stretch in your shoulder (flexion). Avoid shrugging your shoulder while you raise your arm. Keep your shoulder blade tucked down toward the middle of your back. Hold for __________ seconds. Slowly return to the starting position. Repeat __________ times. Complete this exercise __________ times a day. Shoulder abduction, standing Stand and hold a broomstick, a cane, or a similar object. Place your hands a little more than shoulder width apart on the object. Your left / right hand should be palm up, and your other hand should be palm down. Keep your elbow straight and your shoulder muscles relaxed. Push the object across your body toward your left / right side. Raise your left / right arm to the side of your body (abduction) until you feel a stretch in your shoulder. Do not raise your arm above shoulder height unless your health care provider tells you to  do that. If directed, raise your arm over your head. Avoid shrugging your shoulder while you raise your arm. Keep your shoulder blade tucked down toward the middle of your back. Hold for __________ seconds. Slowly return to the starting position. Repeat __________ times. Complete this exercise __________ times a day. Internal rotation  Place your left / right hand behind your back, palm up. Use your other hand to dangle an exercise band, a towel, or a similar object over your shoulder. Grasp the band with your left / right hand so you are holding on to both ends. Gently pull up on the band until you feel a stretch in the front of your left / right shoulder. The movement of your arm toward the center of your body is called internal  rotation. Avoid shrugging your shoulder while you raise your arm. Keep your shoulder blade tucked down toward the middle of your back. Hold for __________ seconds. Release the stretch by letting go of the band and lowering your hands. Repeat __________ times. Complete this exercise __________ times a day. Strengthening exercises External rotation  Sit in a stable chair without armrests. Secure an exercise band to a stable object at elbow height on your left / right side. Place a soft object, such as a folded towel or a small pillow, between your left / right upper arm and your body to move your elbow about 4 inches (10 cm) away from your side. Hold the end of the exercise band so it is tight and there is no slack. Keeping your elbow pressed against the soft object, slowly move your forearm out, away from your abdomen (external rotation). Keep your body steady so only your forearm moves. Hold for __________ seconds. Slowly return to the starting position. Repeat __________ times. Complete this exercise __________ times a day. Shoulder abduction  Sit in a stable chair without armrests, or stand up. Hold a __________ weight in your left / right hand, or hold an exercise band with both hands. Start with your arms straight down and your left / right palm facing in, toward your body. Slowly lift your left / right hand out to your side (abduction). Do not lift your hand above shoulder height unless your health care provider tells you that this is safe. Keep your arms straight. Avoid shrugging your shoulder while you do this movement. Keep your shoulder blade tucked down toward the middle of your back. Hold for __________ seconds. Slowly lower your arm, and return to the starting position. Repeat __________ times. Complete this exercise __________ times a day. Shoulder extension Sit in a stable chair without armrests, or stand up. Secure an exercise band to a stable object in front of you so it  is at shoulder height. Hold one end of the exercise band in each hand. Your palms should face each other. Straighten your elbows and lift your hands up to shoulder height. Step back, away from the secured end of the exercise band, until the band is tight and there is no slack. Squeeze your shoulder blades together as you pull your hands down to the sides of your thighs (extension). Stop when your hands are straight down by your sides. Do not let your hands go behind your body. Hold for __________ seconds. Slowly return to the starting position. Repeat __________ times. Complete this exercise __________ times a day. Shoulder row Sit in a stable chair without armrests, or stand up. Secure an exercise band to a stable object in front of  you so it is at waist height. Hold one end of the exercise band in each hand. Position your palms so that your thumbs are facing the ceiling (neutral position). Bend each of your elbows to a 90-degree angle (right angle) and keep your upper arms at your sides. Step back until the band is tight and there is no slack. Slowly pull your elbows back behind you. Hold for __________ seconds. Slowly return to the starting position. Repeat __________ times. Complete this exercise __________ times a day. Shoulder press-ups  Sit in a stable chair that has armrests. Sit upright, with your feet flat on the floor. Put your hands on the armrests so your elbows are bent and your fingers are pointing forward. Your hands should be about even with the sides of your body. Push down on the armrests and use your arms to lift yourself off the chair. Straighten your elbows and lift yourself up as much as you comfortably can. Move your shoulder blades down, and avoid letting your shoulders move up toward your ears. Keep your feet on the ground. As you get stronger, your feet should support less of your body weight as you lift yourself up. Hold for __________ seconds. Slowly lower  yourself back into the chair. Repeat __________ times. Complete this exercise __________ times a day. Wall push-ups  Stand so you are facing a stable wall. Your feet should be about one arm-length away from the wall. Lean forward and place your palms on the wall at shoulder height. Keep your feet flat on the floor as you bend your elbows and lean forward toward the wall. Hold for __________ seconds. Straighten your elbows to push yourself back to the starting position. Repeat __________ times. Complete this exercise __________ times a day. This information is not intended to replace advice given to you by your health care provider. Make sure you discuss any questions you have with your health care provider. Document Revised: 11/22/2018 Document Reviewed: 08/30/2018 Elsevier Patient Education  [employer].

## 2021-09-01 NOTE — Progress Notes (Signed)
Creatinine is better.  CBC is normal.

## 2021-09-05 NOTE — Telephone Encounter (Signed)
I am forwarding this phone note to Rico Junker. RMA, Janan Halter, Clinic Supervisor and Drue Novel, Clinic Supervisor. Pt is coming in on my day off and has been informed she will need to ask for one of these 3 co-workers to set up her device. Please see previous notes as well.

## 2021-09-12 ENCOUNTER — Ambulatory Visit: Payer: PPO | Admitting: Physician Assistant

## 2021-09-14 ENCOUNTER — Encounter (INDEPENDENT_AMBULATORY_CARE_PROVIDER_SITE_OTHER): Payer: Self-pay | Admitting: Family Medicine

## 2021-09-14 ENCOUNTER — Telehealth (INDEPENDENT_AMBULATORY_CARE_PROVIDER_SITE_OTHER): Payer: PPO | Admitting: Family Medicine

## 2021-09-14 ENCOUNTER — Other Ambulatory Visit: Payer: Self-pay

## 2021-09-14 ENCOUNTER — Other Ambulatory Visit: Payer: Self-pay | Admitting: Physician Assistant

## 2021-09-14 DIAGNOSIS — Z6836 Body mass index (BMI) 36.0-36.9, adult: Secondary | ICD-10-CM

## 2021-09-14 DIAGNOSIS — E669 Obesity, unspecified: Secondary | ICD-10-CM | POA: Diagnosis not present

## 2021-09-14 DIAGNOSIS — R001 Bradycardia, unspecified: Secondary | ICD-10-CM

## 2021-09-14 NOTE — Telephone Encounter (Signed)
Next Visit: 01/31/2022  Last Visit: 08/31/2021  Last Fill: 04/11/2021  Dx: Rheumatoid arthritis involving multiple sites with positive rheumatoid factor  Current Dose per office note on 08/31/2021: not discussed  Okay to refill Phenergan?

## 2021-09-14 NOTE — Telephone Encounter (Signed)
I s/w RN for ordering MD. We have been able to set up for pt to come in the office for Itamar set up. I am going to remove from my in box at this time. I will forward this to RN. I am happy to help set up Itamar if pt does come in the office.

## 2021-09-14 NOTE — Telephone Encounter (Signed)
Per Ayush at [facility] No PA required for itamar study.

## 2021-09-15 NOTE — Progress Notes (Signed)
TeleHealth Visit:  Due to the COVID-19 pandemic, this visit was completed with telemedicine (audio/video) technology to reduce patient and provider exposure as well as to preserve personal protective equipment.   [name] has verbally consented to this TeleHealth visit. The patient is located at home, the provider is located at the [facility] office. The participants in this visit include the listed provider and patient. The visit was conducted today via video.   Chief Complaint: OBESITY [name] is here to discuss her progress with her obesity treatment plan along with follow-up of her obesity related diagnoses. [name] is on the Category 3 Plan and states she is following her eating plan approximately 80-85% of the time. [name] states she is walking 1 mile 3 times per week.  Today's visit was #: 6 Starting weight: 239 lbs Starting date: 12/01/2019  Interim History: Pt is at home with a GI bug. She started getting sick around 4 AM. Prior to getting sick, she was wearing a heart monitor for 2 weeks. Pt has been limited in her walking due to rain. She is doing plan 3 most of the time- getting full fairly easily. She is drinking protein shake every other day. Pt wants to get back into more consistent exercise.   Subjective:   1. Bradycardia Wearing Zio heart monitor for 2 weeks. Pt saw Dr. Radford Pax for assessment- thinks it is PVCs.  Assessment/Plan:   1. Bradycardia Follow up on results of monitor.  2. Obesity with current BMI of 36.3 [name] is currently in the action stage of change. As such, her goal is to continue with weight loss efforts. She has agreed to the Category 3 Plan.   Exercise goals: All adults should avoid inactivity. Some physical activity is better than none, and adults who participate in any amount of physical activity gain some health benefits.  Behavioral modification strategies: increasing lean protein intake, meal planning and cooking strategies, keeping  healthy foods in the home, and planning for success.  [name] has agreed to follow-up with our clinic in 3 weeks. She was informed of the importance of frequent follow-up visits to maximize her success with intensive lifestyle modifications for her multiple health conditions.  Objective:   VITALS: Per patient if applicable, see vitals. GENERAL: Alert and in no acute distress. CARDIOPULMONARY: No increased WOB. Speaking in clear sentences.  PSYCH: Pleasant and cooperative. Speech normal rate and rhythm. Affect is appropriate. Insight and judgement are appropriate. Attention is focused, linear, and appropriate.  NEURO: Oriented as arrived to appointment on time with no prompting.   Lab Results  Component Value Date   CREATININE 1.23 (H) 08/31/2021   BUN 23 08/31/2021   NA 141 08/31/2021   K 4.4 08/31/2021   CL 104 08/31/2021   CO2 29 08/31/2021   Lab Results  Component Value Date   ALT 17 08/31/2021   AST 20 08/31/2021   ALKPHOS 143 (H) 01/31/2021   BILITOT 0.3 08/31/2021   Lab Results  Component Value Date   HGBA1C 5.2 01/31/2021   HGBA1C 5.5 09/23/2020   HGBA1C 5.8 (H) 05/11/2020   HGBA1C 6.2 (H) 12/01/2019   HGBA1C 6.1 (H) 08/26/2019   Lab Results  Component Value Date   INSULIN 14.2 01/31/2021   INSULIN 25.4 (H) 09/23/2020   INSULIN 21.0 06/01/2020   INSULIN 32.3 (H) 12/01/2019   Lab Results  Component Value Date   TSH 1.550 12/01/2019   Lab Results  Component Value Date   CHOL 167 01/31/2021  HDL 39 (L) 01/31/2021   LDLCALC 86 01/31/2021   TRIG 253 (H) 01/31/2021   CHOLHDL 3.5 08/26/2019   Lab Results  Component Value Date   VD25OH 92.2 09/23/2020   VD25OH 62.9 12/01/2019   VD25OH 61.3 08/26/2019   Lab Results  Component Value Date   WBC 7.7 08/31/2021   HGB 13.5 08/31/2021   HCT 40.5 08/31/2021   MCV 85.6 08/31/2021   PLT 193 08/31/2021   No results found for: IRON, TIBC, FERRITIN  Attestation Statements:   Reviewed by clinician on day of  visit: allergies, medications, problem list, medical history, surgical history, family history, social history, and previous encounter notes.  Coral Ceo, CMA, am acting as transcriptionist for Coralie Common, MD.   I have reviewed the above documentation for accuracy and completeness, and I agree with the above. - Coralie Common, MD

## 2021-09-20 ENCOUNTER — Encounter: Payer: PPO | Admitting: Physician Assistant

## 2021-09-21 NOTE — Addendum Note (Signed)
Addended by: Shona Needles on: 09/21/2021 10:34 AM   Modules accepted: Orders

## 2021-09-22 ENCOUNTER — Telehealth: Payer: Self-pay | Admitting: *Deleted

## 2021-09-22 ENCOUNTER — Encounter: Payer: Self-pay | Admitting: *Deleted

## 2021-09-22 DIAGNOSIS — G4733 Obstructive sleep apnea (adult) (pediatric): Secondary | ICD-10-CM

## 2021-09-22 NOTE — Telephone Encounter (Signed)
Pt did not come in for the Itamar sleep study set up. I will update Carly, RN for Dr. Radford Pax that the pt did not pick up the study today as planned. I will remove from my basket at this time.

## 2021-09-22 NOTE — Telephone Encounter (Signed)
This encounter was created in error - please disregard.

## 2021-09-22 NOTE — Telephone Encounter (Signed)
Patient Calls (Newest Message First) Freada Bergeron, CMA  You 1 hour ago (9:35 AM)   Per Meet S. @ [facility] NO PA required    Freada Bergeron, CMA 1 hour ago (9:30 AM)   Prior Authorization for [employer] sent to [facility] via Phone. Reference # [id number].    Per Meet S. @ [facility] NO PA required      Note

## 2021-09-22 NOTE — Telephone Encounter (Signed)
Prior Authorization for [employer] sent to [facility] via Phone. Reference # [id number].   Per Meet S. @ [facility] NO PA required

## 2021-09-23 NOTE — Telephone Encounter (Signed)
Pt walked in the office today to see if she could get her sleep study. I met with the pt and help set up her Itamar Sleep study. Pt has been approved already per [place]. CMA sleep auth. Pt has been given the PIN # [id number] and ok to proceed. Pt states she will do the sleep study tomorrow.   Called and made the patient aware that she may proceed with the [facility] Sleep Study. PIN # [id number] provided to the patient. Patient made aware that she will be contacted after the test has been read with the results and any recommendations. Patient verbalized understanding and thanked me for the call.

## 2021-09-23 NOTE — Telephone Encounter (Signed)
Patient Name:         DOB:       Height:     Weight:  Office Name:         Referring Provider:  Today's Date:  Date:   STOP BANG RISK ASSESSMENT S (snore) Have you been told that you snore?     YES   T (tired) Are you often tired, fatigued, or sleepy during the day?   YES  O (obstruction) Do you stop breathing, choke, or gasp during sleep? NO   P (pressure) Do you have or are you being treated for high blood pressure? NO   B (BMI) Is your body index greater than 35 kg/m? YES  A (age) Are you 8 years old or older? YES   N (neck) Do you have a neck circumference greater than 16 inches?   YES   G (gender) Are you a female? NO   TOTAL STOP/BANG YES ANSWERS 5                                                                       For Office Use Only              Procedure Order Form    YES to 3+ Stop Bang questions OR two clinical symptoms - patient qualifies for WatchPAT (CPT 95800)             Clinical Notes: Will consult Sleep Specialist and refer for management of therapy due to patient increased risk of Sleep Apnea. Ordering a sleep study due to the following two clinical symptoms: Excessive daytime sleepiness G47.10 / Gastroesophageal reflux K21.9 / Nocturia R35.1 / Morning Headaches G44.221 / Difficulty concentrating R41.840 / Memory problems or poor judgment G31.84 / Personality changes or irritability R45.4 / Loud snoring R06.83 / Depression F32.9 / Unrefreshed by sleep G47.8 / Impotence N52.9 / History of high blood pressure R03.0 / Insomnia G47.00    I understand that I am proceeding with a home sleep apnea test as ordered by my treating physician. I understand that untreated sleep apnea is a serious cardiovascular risk factor and it is my responsibility to perform the test and seek management for sleep apnea. I will be contacted with the results and be managed for sleep apnea by a local sleep physician. I will be receiving equipment and further instructions from [facility]. I shall promptly ship back the equipment via the included mailing label. I understand my insurance will be billed for the test and as the patient I am responsible for any insurance related out-of-pocket costs incurred. I have been provided with written instructions and can call for additional video or telephonic instruction, with 24-hour availability of qualified personnel to answer any questions: Patient Help Desk [phone].  Patient Signature ______________________________________________________   Date______________________ Patient Telemedicine Verbal Consent

## 2021-09-26 ENCOUNTER — Encounter (INDEPENDENT_AMBULATORY_CARE_PROVIDER_SITE_OTHER): Payer: PPO | Admitting: Cardiology

## 2021-09-26 DIAGNOSIS — G4733 Obstructive sleep apnea (adult) (pediatric): Secondary | ICD-10-CM | POA: Diagnosis not present

## 2021-09-27 ENCOUNTER — Ambulatory Visit (INDEPENDENT_AMBULATORY_CARE_PROVIDER_SITE_OTHER): Payer: PPO | Admitting: Physician Assistant

## 2021-09-27 ENCOUNTER — Ambulatory Visit: Payer: PPO

## 2021-09-27 ENCOUNTER — Other Ambulatory Visit: Payer: Self-pay

## 2021-09-27 ENCOUNTER — Encounter: Payer: Self-pay | Admitting: Physician Assistant

## 2021-09-27 VITALS — BP 103/69 | HR 69 | Temp 97.8°F | Ht 61.5 in | Wt 194.0 lb

## 2021-09-27 DIAGNOSIS — M810 Age-related osteoporosis without current pathological fracture: Secondary | ICD-10-CM | POA: Diagnosis not present

## 2021-09-27 DIAGNOSIS — I10 Essential (primary) hypertension: Secondary | ICD-10-CM

## 2021-09-27 DIAGNOSIS — I493 Ventricular premature depolarization: Secondary | ICD-10-CM

## 2021-09-27 DIAGNOSIS — Z0000 Encounter for general adult medical examination without abnormal findings: Secondary | ICD-10-CM | POA: Diagnosis not present

## 2021-09-27 DIAGNOSIS — I5032 Chronic diastolic (congestive) heart failure: Secondary | ICD-10-CM

## 2021-09-27 DIAGNOSIS — G4733 Obstructive sleep apnea (adult) (pediatric): Secondary | ICD-10-CM

## 2021-09-27 DIAGNOSIS — R001 Bradycardia, unspecified: Secondary | ICD-10-CM

## 2021-09-27 NOTE — Patient Instructions (Addendum)
Mammogram in April after the 19th Preventive Care 65 Years and Older, Female Preventive care refers to lifestyle choices and visits with your health care provider that can promote health and wellness. Preventive care visits are also called wellness exams. What can I expect for my preventive care visit? Counseling Your health care provider may ask you questions about your: Medical history, including: Past medical problems. Family medical history. Pregnancy and menstrual history. History of falls. Current health, including: Memory and ability to understand (cognition). Emotional well-being. Home life and relationship well-being. Sexual activity and sexual health. Lifestyle, including: Alcohol, nicotine or tobacco, and drug use. Access to firearms. Diet, exercise, and sleep habits. Work and work Statistician. Sunscreen use. Safety issues such as seatbelt and bike helmet use. Physical exam Your health care provider will check your: Height and weight. These may be used to calculate your BMI (body mass index). BMI is a measurement that tells if you are at a healthy weight. Waist circumference. This measures the distance around your waistline. This measurement also tells if you are at a healthy weight and may help predict your risk of certain diseases, such as type 2 diabetes and high blood pressure. Heart rate and blood pressure. Body temperature. Skin for abnormal spots. What immunizations do I need? Vaccines are usually given at various ages, according to a schedule. Your health care provider will recommend vaccines for you based on your age, medical history, and lifestyle or other factors, such as travel or where you work. What tests do I need? Screening Your health care provider may recommend screening tests for certain conditions. This may include: Lipid and cholesterol levels. Hepatitis C test. Hepatitis B test. HIV (human immunodeficiency virus) test. STI (sexually transmitted  infection) testing, if you are at risk. Lung cancer screening. Colorectal cancer screening. Diabetes screening. This is done by checking your blood sugar (glucose) after you have not eaten for a while (fasting). Mammogram. Talk with your health care provider about how often you should have regular mammograms. BRCA-related cancer screening. This may be done if you have a family history of breast, ovarian, tubal, or peritoneal cancers. Bone density scan. This is done to screen for osteoporosis. Talk with your health care provider about your test results, treatment options, and if necessary, the need for more tests. Follow these instructions at home: Eating and drinking  Eat a diet that includes fresh fruits and vegetables, whole grains, lean protein, and low-fat dairy products. Limit your intake of foods with high amounts of sugar, saturated fats, and salt. Take vitamin and mineral supplements as recommended by your health care provider. Do not drink alcohol if your health care provider tells you not to drink. If you drink alcohol: Limit how much you have to 0-1 drink a day. Know how much alcohol is in your drink. In the [place] one drink equals one 12 oz bottle of beer (355 mL), one 5 oz glass of wine (148 mL), or one 1 oz glass of hard liquor (44 mL). Lifestyle Brush your teeth every morning and night with fluoride toothpaste. Floss one time each day. Exercise for at least 30 minutes 5 or more days each week. Do not use any products that contain nicotine or tobacco. These products include cigarettes, chewing tobacco, and vaping devices, such as e-cigarettes. If you need help quitting, ask your health care provider. Do not use drugs. If you are sexually active, practice safe sex. Use a condom or other form of protection in order to prevent STIs. Take  aspirin only as told by your health care provider. Make sure that you understand how much to take and what form to take. Work with your health care  provider to find out whether it is safe and beneficial for you to take aspirin daily. Ask your health care provider if you need to take a cholesterol-lowering medicine (statin). Find healthy ways to manage stress, such as: Meditation, yoga, or listening to music. Journaling. Talking to a trusted person. Spending time with friends and family. Minimize exposure to UV radiation to reduce your risk of skin cancer. Safety Always wear your seat belt while driving or riding in a vehicle. Do not drive: If you have been drinking alcohol. Do not ride with someone who has been drinking. When you are tired or distracted. While texting. If you have been using any mind-altering substances or drugs. Wear a helmet and other protective equipment during sports activities. If you have firearms in your house, make sure you follow all gun safety procedures. What's next? Visit your health care provider once a year for an annual wellness visit. Ask your health care provider how often you should have your eyes and teeth checked. Stay up to date on all vaccines. This information is not intended to replace advice given to you by your health care provider. Make sure you discuss any questions you have with your health care provider. Document Revised: 01/26/2021 Document Reviewed: 01/26/2021 Elsevier Patient Education  [employer].

## 2021-09-27 NOTE — Procedures (Signed)
° °  Sleep Study Report  Patient Information Study Date: 09/26/21 Patient Name: [name] Patient ID: [id number] Birth Date: 2050-11-09 Age: 71 Gender: Female BMI: 37.5 (W=198 lb, H=5' 1'') Neck Circ.: 22 ''  Referring Physician: Fransico Him, MD  TEST DESCRIPTION: Home sleep apnea testing was completed using the WatchPat, a Type 1 device, utilizing peripheral arterial tonometry (PAT), chest movement, actigraphy, pulse oximetry, pulse rate, body position and snore. AHI was calculated with apnea and hypopnea using valid sleep time as the denominator. RDI includes apneas, hypopneas, and RERAs. The data acquired and the scoring of sleep and all associated events were performed in accordance with the recommended standards and specifications as outlined in the AASM Manual for the Scoring of Sleep and Associated Events 2.2.0 (2015).  FINDINGS: 1. Moderate Obstructive Sleep Apnea with AHI15.2 /hr. 2. No Central Sleep Apnea with pAHIc 0.7/hr. 3. Oxygen desaturations as low as 85%. 4. Mild to modeate snoring was present. O2 sats were < 88% for 0.6 min. 5. Total sleep time was 6 hrs and 10 min. 6. 0% of total sleep time was spent in REM sleep. 7. Shortened sleep onset latency at 6 min 8. No REM sleep 9. Total awakenings were 9.  DIAGNOSIS: Moderate Obstructive Sleep Apnea (G47.33)  RECOMMENDATIONS: 1. Clinical correlation of these findings is necessary. The decision to treat obstructive sleep apnea (OSA) is usually based on the presence of apnea symptoms or the presence of associated medical conditions such as Hypertension, Congestive Heart Failure, Atrial Fibrillation or Obesity. The most common symptoms of OSA are snoring, gasping for breath while sleeping, daytime sleepiness and fatigue.  2. Initiating apnea therapy is recommended given the presence of symptoms and/or associated conditions. Recommend proceeding with one of the following:   a. Auto-CPAP therapy with a pressure  range of 5-20cm H2O.   b. An oral appliance (OA) that can be obtained from certain dentists with expertise in sleep medicine. These are primarily of use in non-obese patients with mild and moderate disease.   c. An ENT consultation which may be useful to look for specific causes of obstruction and possible treatment options.   d. If patient is intolerant to PAP therapy, consider referral to ENT for evaluation for hypoglossal nerve stimulator.  3. Close follow-up is necessary to ensure success with CPAP or oral appliance therapy for maximum benefit .  4. A follow-up oximetry study on CPAP is recommended to assess the adequacy of therapy and determine the need for supplemental oxygen or the potential need for Bi-level therapy. An arterial blood gas to determine the adequacy of baseline ventilation and oxygenation should also be considered.  5. Healthy sleep recommendations include: adequate nightly sleep (normal 7-9 hrs/night), avoidance of caffeine after noon and alcohol near bedtime, and maintaining a sleep environment that is cool, dark and quiet.  6. Weight loss for overweight patients is recommended. Even modest amounts of weight loss can significantly improve the severity of sleep apnea.  7. Snoring recommendations include: weight loss where appropriate, side sleeping, and avoidance of alcohol before bed.  8. Operation of motor vehicle should not be performed when sleepy.  Signature: Electronically Signed: 09/27/21 Fransico Him, MD; [facility]; [place], American Board of Sleep Medicine

## 2021-09-27 NOTE — Progress Notes (Signed)
Subjective:   [name] is a 71 y.o. female who presents for Medicare Annual (Subsequent) preventive examination.  Review of Systems    General:   No F/C, wt loss Pulm:   No DIB, SOB, pleuritic chest pain Card:  No CP, palpitations Abd:  No n/v/d or pain Ext:  No inc edema from baseline    Objective:    Today's Vitals   09/27/21 1540  BP: 103/69  Pulse: 69  Temp: 97.8 F (36.6 C)  SpO2: 96%  Weight: 194 lb (88 kg)  Height: 5' 1.5" (1.562 m)   Body mass index is 36.06 kg/m.  Advanced Directives 07/25/2019 09/26/2017 03/20/2017 02/03/2017 01/31/2017 01/19/2016 06/24/2014  Does Patient Have a Medical Advance Directive? Yes Yes Yes No No;Yes Yes Yes  Type of Paramedic of [place];Living will [place];Living will [place];Living will - Healthcare Power of [place] -  Does patient want to make changes to medical advance directive? No - Patient declined - No - Patient declined - - No - Patient declined -  Copy of [place] in Chart? Yes - validated most recent copy scanned in chart (See row information) - (No Data) - No - copy requested - No - copy requested    Current Medications (verified) Outpatient Encounter Medications as of 09/27/2021  Medication Sig   albuterol (VENTOLIN HFA) 108 (90 Base) MCG/ACT inhaler Inhale 2 puffs into the lungs every 4 (four) hours as needed for wheezing or shortness of breath.   aspirin EC 81 MG tablet Take 81 mg by mouth at bedtime.   cholestyramine (QUESTRAN) 4 GM/DOSE powder Take by mouth 3 (three) times daily with meals. As needed   cycloSPORINE (RESTASIS) 0.05 % ophthalmic emulsion Place 2 drops into both eyes 2 (two) times daily.   diphenoxylate-atropine (LOMOTIL) 2.5-0.025 MG per tablet Take 1 tablet by mouth 4 (four) times daily as needed for diarrhea or loose stools.   doxylamine, Sleep, (UNISOM) 25 MG tablet Take 1 tablet (25 mg  total) by mouth at bedtime as needed.   fluticasone (FLONASE) 50 MCG/ACT nasal spray Place 1 spray into both nostrils daily. (Patient taking differently: Place 1 spray into both nostrils as needed.)   gabapentin (NEURONTIN) 300 MG capsule Take 2 capsules (600 mg total) by mouth 2 (two) times daily.   hydroxychloroquine (PLAQUENIL) 200 MG tablet Take 200mg  by mouth twice daily, Monday through Friday only.   hyoscyamine (ANASPAZ) 0.125 MG TBDP disintergrating tablet Place 0.25 mg under the tongue every 6 (six) hours as needed for cramping.    leflunomide (ARAVA) 20 MG tablet Take 1 tablet (20 mg total) by mouth daily.   levocetirizine (XYZAL) 5 MG tablet Take 1 tablet (5 mg total) by mouth every evening.   lidocaine (LIDODERM) 5 % PLACE 1 PATCH ONTO THE SKIN DAILY AS NEEDED FOR PAIN. REMOVE AND DISCARD PATCH WITHIN 12 HOURS OR AS DIRECTED BY MD   metoprolol succinate (TOPROL-XL) 25 MG 24 hr tablet Take 12.5 mg by mouth daily.   NONFORMULARY OR COMPOUNDED ITEM Triamcinolone 0.1% & Silvadene cream 1:1- Apply as directed to affected areas as needed   potassium chloride (KLOR-CON) 10 MEQ tablet Take 2 tablets (20 mEq total) by mouth daily.   Probiotic Product (PROBIOTIC DAILY PO) Take by mouth. Ultraflora IB Probiotic   promethazine (PHENERGAN) 25 MG tablet TAKE 1 TABLET BY MOUTH EVERY 6 HOURS AS NEEDED.   Semaglutide, 1 MG/DOSE, 4  MG/3ML SOPN Inject 1 mg as directed once a week.   spironolactone (ALDACTONE) 25 MG tablet Take 25 mg by mouth daily.   torsemide 40 MG TABS Take 40 mg by mouth daily.   UNABLE TO FIND CPAP: AT bedtime; setting is "12"   No facility-administered encounter medications on file as of 09/27/2021.    Allergies (verified) Sulfa antibiotics, Cymbalta [duloxetine hcl], Demerol, Ivp dye [iodinated contrast media], Morphine and related, Sulfasalazine, and Adhesive [tape]   History: Past Medical History:  Diagnosis Date   Arthritis    Rheumatoid arthritis,    Back pain     Blood transfusion    1981   CHF (congestive heart failure) (HCC)    Chronic diastolic CHF (congestive heart failure) (HCC)    diastolic    DDD (degenerative disc disease), cervical    DDD (degenerative disc disease), lumbar    Edema of both lower extremities    Fibromyalgia    Gallbladder problem    GERD (gastroesophageal reflux disease)    Heart murmur    as a child   Hiatal hernia    sjorgens syndrome   High blood pressure    High cholesterol    IBS (irritable bowel syndrome)    Joint pain    Numbness and tingling of both feet    Numbness of fingers    OSA (obstructive sleep apnea)    Osteoarthritis    Peripheral autonomic neuropathy of unknown cause    Pneumonia 07/2018   Polyneuropathy    Pre-diabetes    PVC (premature ventricular contraction)    Raynaud disease    Rheumatoid arthritis ([place])    Sjogren's disease ([place])    Sleep apnea    SOB (shortness of breath)    Swallowing difficulty    Urticaria    Vitamin D deficiency    Past Surgical History:  Procedure Laterality Date   ABDOMINAL HYSTERECTOMY     BTL, BSO   ADENOIDECTOMY     BREAST SURGERY     mass removal    CHOLECYSTECTOMY     dental implants     DIAGNOSTIC LAPAROSCOPY     x3   KNEE ARTHROSCOPY     x2   MASS EXCISION Left 03/22/2017   Procedure: EXCISION OF LEFT BREAST MASS;  Surgeon: Donnie Mesa, MD;  Location: [facility];  Service: General;  Laterality: Left;   patotid cystectomy     RIGHT/LEFT HEART CATH AND CORONARY ANGIOGRAPHY N/A 07/25/2019   Procedure: RIGHT/LEFT HEART CATH AND CORONARY ANGIOGRAPHY;  Surgeon: Jolaine Artist, MD;  Location: [place] [facility];  Service: Cardiovascular;  Laterality: N/A;   TENDON REPAIR  1980   left ankle and tibia   TONSILLECTOMY     TOTAL KNEE ARTHROPLASTY  06/21/2011   Procedure: TOTAL KNEE ARTHROPLASTY;  Surgeon: Gearlean Alf;  Location: [facility];  Service: Orthopedics;  Laterality: Right;   TUBAL LIGATION     Family History  Problem Relation Age  of Onset   Alzheimer's disease Mother    Heart attack Mother    Hypertension Mother    Glaucoma Mother    High Cholesterol Mother    Heart disease Mother    Depression Mother    Cancer Father    High Cholesterol Father    Heart disease Father    Sleep apnea Father    Heart attack Brother    Heart disease Brother    Glaucoma Brother    Hyperlipidemia Brother    Glaucoma Brother  Asthma Son    Allergic rhinitis Neg Hx    Angioedema Neg Hx    Eczema Neg Hx    Urticaria Neg Hx    Neuropathy Neg Hx    Social History   Socioeconomic History   Marital status: Widowed    Spouse name: Not on file   Number of children: 2   Years of education: AS   Highest education level: Not on file  Occupational History   Occupation: retired Marine scientist   Occupation: Therapist, sports  Tobacco Use   Smoking status: Never   Smokeless tobacco: Never  Vaping Use   Vaping Use: Never used  Substance and Sexual Activity   Alcohol use: Yes    Comment: socially    Drug use: No   Sexual activity: Never  Other Topics Concern   Not on file  Social History Narrative   Lives at home alone   Right-handed   Drinks 1 or less cups of coffee and 2 servings of either tea or soda per day   Social Determinants of Health   Financial Resource Strain: Not on file  Food Insecurity: Not on file  Transportation Needs: Not on file  Physical Activity: Not on file  Stress: Not on file  Social Connections: Not on file    Tobacco Counseling Counseling given: Not Answered    Diabetic? No, hx of prediabetes     Activities of Daily Living In your present state of health, do you have any difficulty performing the following activities: 09/16/2021 05/10/2021  Hearing? N N  Vision? N N  Difficulty concentrating or making decisions? Y N  Walking or climbing stairs? Y Y  Dressing or bathing? N N  Doing errands, shopping? N N  Preparing Food and eating ? N -  Using the Toilet? N -  In the past six months, have you  accidently leaked urine? N -  Do you have problems with loss of bowel control? Y -  Managing your Medications? N -  Managing your Finances? N -  Housekeeping or managing your Housekeeping? Y -  Some recent data might be hidden    Patient Care Team: Lorrene Reid, PA-C as PCP - General (Physician Assistant) Sueanne Margarita, MD as PCP - Cardiology (Cardiology) Bo Merino, MD as Consulting Physician (Rheumatology) Sueanne Margarita, MD as Consulting Physician (Cardiology) Gaynelle Arabian, MD as Consulting Physician (Orthopedic Surgery) Kathrynn Ducking, MD (Inactive) as Consulting Physician (Neurology) Juanita Craver, MD as Consulting Physician (Gastroenterology) Martinique, Amy, MD as Consulting Physician (Dermatology) Corliss Parish, MD as Consulting Physician (Nephrology) Bobbitt, Sed[place] Muta, MD as Consulting Physician (Allergy and Immunology)  Indicate any recent Medical Services you may have received from other than [facility] providers in the past year (date may be approximate).     Assessment:   This is a routine wellness examination for [name].  Hearing/Vision screen No results found.  Dietary issues and exercise activities discussed: -Followed by [facility] and follow a specific diet plan. Tries to stay as active as possible.   Goals Addressed   None   Depression Screen PHQ 2/9 Scores 05/10/2021 02/04/2021 07/28/2020 03/17/2020 12/01/2019 09/02/2019 06/17/2019  PHQ - 2 Score 0 0 0 0 3 0 1  PHQ- 9 Score 4 5 6 7 6 1 6   Exception Documentation - - - - Medical reason - -    Fall Risk Fall Risk  09/16/2021 05/10/2021 02/04/2021 07/28/2020 03/17/2020  Falls in the past year? 1 1 1 1  1  Number falls in past yr: 0 1 1 0 1  Injury with Fall? 0 0 1 1 0  Risk for fall due to : - History of fall(s);Impaired balance/gait;Impaired mobility - History of fall(s) -  Risk for fall due to: Comment - - - - -  Follow up - Falls evaluation completed Falls evaluation completed  Falls evaluation completed Falls evaluation completed    [place]:  Any stairs in or around the home? Yes  If so, are there any without handrails? No  Home free of loose throw rugs in walkways, pet beds, electrical cords, etc? Yes  Adequate lighting in your home to reduce risk of falls? Yes   ASSISTIVE DEVICES UTILIZED TO PREVENT FALLS:  Life alert? No  Use of a cane, walker or w/c? Yes  Cane on occasion  Grab bars in the bathroom? Yes  Shower chair or bench in shower? Yes  Elevated toilet seat or a handicapped toilet? Yes   TIMED UP AND GO:  Was the test performed? Yes .  Length of time to ambulate 10 feet: 25 sec.   Gait slow and steady without use of assistive device  Cognitive Function: wnl's      6CIT Screen 09/27/2021 04/29/2019  What Year? 0 points 0 points  What month? 0 points 0 points  What time? 0 points 0 points  Count back from 20 0 points 0 points  Months in reverse 0 points 0 points  Repeat phrase 0 points 0 points  Total Score 0 0    Immunizations Immunization History  Administered Date(s) Administered   Fluad Quad(high Dose 65+) 05/05/2021   Influenza, High Dose Seasonal PF 06/12/2018, 05/17/2019   Influenza,inj,Quad PF,6+ Mos 05/23/2016, 06/01/2017   Influenza-Unspecified 06/05/2018, 05/17/2019, 06/07/2020   PFIZER(Purple Top)SARS-COV-2 Vaccination 09/03/2019, 09/24/2019, 04/01/2020, 10/04/2020   Pfizer Covid-19 Vaccine Bivalent Booster 64yrs & up 05/05/2021   Pneumococcal Conjugate-13 07/02/2017   Pneumococcal Polysaccharide-23 05/17/2005   Td 10/16/2007   Tdap 03/21/2018   Zoster Recombinat (Shingrix) 06/10/2019, 12/11/2019    TDAP status: Up to date  Flu Vaccine status: Up to date  Pneumococcal vaccine status: Up to date  Covid-19 vaccine status: Completed vaccines  Qualifies for Shingles Vaccine? Yes   Zostavax completed No   Shingrix Completed?: Yes  Screening Tests Health Maintenance  Topic  Date Due   Pneumonia Vaccine 51+ Years old (3 - PPSV23 if available, else PCV20) 07/02/2018   URINE MICROALBUMIN  07/28/2021   COLONOSCOPY (Pts 45-108yrs Insurance coverage will need to be confirmed)  11/21/2022   MAMMOGRAM  12/01/2022   TETANUS/TDAP  03/21/2028   INFLUENZA VACCINE  Completed   DEXA SCAN  Completed   COVID-19 Vaccine  Completed   Hepatitis C Screening  Completed   Zoster Vaccines- Shingrix  Completed   HPV VACCINES  Aged Out    Health Maintenance  Health Maintenance Due  Topic Date Due   Pneumonia Vaccine 65+ Years old (27 - PPSV23 if available, else PCV20) 07/02/2018   URINE MICROALBUMIN  07/28/2021    Colorectal cancer screening: Type of screening: Colonoscopy. Completed 11/20/2012. Repeat every 10 years  Mammogram status: Completed 11/30/2020. Repeat every year  Bone Density status: Ordered 09/27/2021. Pt provided with contact info and advised to call to schedule appt.  Lung Cancer Screening: (Low Dose CT Chest recommended if Age 57-80 years, 30 pack-year currently smoking OR have quit w/in 15years.) does not qualify.   Lung Cancer Screening Referral: n/a  Additional Screening:  Hepatitis C Screening: does qualify; Patient declined screening.   Vision Screening: Recommended annual ophthalmology exams for early detection of glaucoma and other disorders of the eye. Is the patient up to date with their annual eye exam?  Yes  Who is the provider or what is the name of the office in which the patient attends annual eye exams? [facility], appt in May 2023 If pt is not established with a provider, would they like to be referred to a provider to establish care? No .   Dental Screening: Recommended annual dental exams for proper oral hygiene  Community Resource Referral / Chronic Care Management: CRR required this visit?  No   CCM required this visit?  No      Plan:  -Schedule lab visit for FBW. -Continue to follow-up with various  specialists. -Discussed the use of cane for ambulation assistance.  -Follow up in 6 months for HLD, CKD, prediabetes  I have personally reviewed and noted the following in the patients chart:   Medical and social history Use of alcohol, tobacco or illicit drugs  Current medications and supplements including opioid prescriptions.  Functional ability and status Nutritional status Physical activity Advanced directives List of other physicians Hospitalizations, surgeries, and ER visits in previous 12 months Vitals Screenings to include cognitive, depression, and falls Referrals and appointments  In addition, I have reviewed and discussed with patient certain preventive protocols, quality metrics, and best practice recommendations. A written personalized care plan for preventive services as well as general preventive health recommendations were provided to patient.   Lorrene Reid, PA-C   09/27/2021

## 2021-09-28 ENCOUNTER — Telehealth: Payer: Self-pay | Admitting: *Deleted

## 2021-09-28 DIAGNOSIS — I5030 Unspecified diastolic (congestive) heart failure: Secondary | ICD-10-CM | POA: Diagnosis not present

## 2021-09-28 DIAGNOSIS — M059 Rheumatoid arthritis with rheumatoid factor, unspecified: Secondary | ICD-10-CM | POA: Diagnosis not present

## 2021-09-28 NOTE — Telephone Encounter (Signed)
We received notification from [employer] that 14 day ZIO XT serial #[id number] was returned to [employer]io with no data on the monitor.  Contacted patient, who stated, she had worn the monitor for 2 weeks.  Apologized that for some reason her monitor did not record data.  She stated she did have some trouble getting monitor to stick.  She is willing to wear another monitor. I will contact our [employer] representative to have another device mailed to the patients home and send instructions to patient via My Chart.

## 2021-10-03 ENCOUNTER — Telehealth (INDEPENDENT_AMBULATORY_CARE_PROVIDER_SITE_OTHER): Payer: PPO | Admitting: Family Medicine

## 2021-10-03 ENCOUNTER — Encounter (INDEPENDENT_AMBULATORY_CARE_PROVIDER_SITE_OTHER): Payer: Self-pay | Admitting: Family Medicine

## 2021-10-03 ENCOUNTER — Telehealth: Payer: Self-pay | Admitting: *Deleted

## 2021-10-03 ENCOUNTER — Other Ambulatory Visit: Payer: Self-pay

## 2021-10-03 DIAGNOSIS — E559 Vitamin D deficiency, unspecified: Secondary | ICD-10-CM | POA: Diagnosis not present

## 2021-10-03 DIAGNOSIS — E669 Obesity, unspecified: Secondary | ICD-10-CM

## 2021-10-03 DIAGNOSIS — R7309 Other abnormal glucose: Secondary | ICD-10-CM

## 2021-10-03 DIAGNOSIS — Z6836 Body mass index (BMI) 36.0-36.9, adult: Secondary | ICD-10-CM | POA: Diagnosis not present

## 2021-10-03 DIAGNOSIS — Z6841 Body Mass Index (BMI) 40.0 and over, adult: Secondary | ICD-10-CM

## 2021-10-03 MED ORDER — SEMAGLUTIDE (1 MG/DOSE) 4 MG/3ML ~~LOC~~ SOPN: 1.0000 mg | PEN_INJECTOR | SUBCUTANEOUS | 0 refills | Status: DC

## 2021-10-03 NOTE — Telephone Encounter (Signed)
Spoke with patient and gave her the results of her sleep study. She agrees to proceed with in lab titration. She is aware that we will get this precerted through her insurance and coordinate a date and time with the sleep lab and call her back at a later date with this information.

## 2021-10-03 NOTE — Progress Notes (Signed)
Spoke with patient and gave her the results of her sleep study. She agrees to proceed with in lab titration. She is aware that we will get this precerted through her insurance and coordinate a date and time with the sleep lab and call her back at a later date with this information.

## 2021-10-04 DIAGNOSIS — R001 Bradycardia, unspecified: Secondary | ICD-10-CM | POA: Diagnosis not present

## 2021-10-04 DIAGNOSIS — I493 Ventricular premature depolarization: Secondary | ICD-10-CM

## 2021-10-04 DIAGNOSIS — G4733 Obstructive sleep apnea (adult) (pediatric): Secondary | ICD-10-CM | POA: Diagnosis not present

## 2021-10-04 DIAGNOSIS — I5032 Chronic diastolic (congestive) heart failure: Secondary | ICD-10-CM

## 2021-10-04 DIAGNOSIS — I10 Essential (primary) hypertension: Secondary | ICD-10-CM | POA: Diagnosis not present

## 2021-10-04 NOTE — Progress Notes (Signed)
TeleHealth Visit:  Due to the COVID-19 pandemic, this visit was completed with telemedicine (audio/video) technology to reduce patient and provider exposure as well as to preserve personal protective equipment.   [name] has verbally consented to this TeleHealth visit. The patient is located at home, the provider is located at the [facility] office. The participants in this visit include the listed provider and patient. The visit was conducted today via video.  Chief Complaint: OBESITY [name] is here to discuss her progress with her obesity treatment plan along with follow-up of her obesity related diagnoses. [name] is on the Category 3 Plan and states she is following her eating plan approximately 90% of the time. [name] states she is walking 10-15 minutes 7 times per week.  Today's visit was #: 10 Starting weight: 239 lbs Starting date: 12/01/2019  Interim History: [name] is having some eye issues today as well as sinus tenderness. Her eye is red and has some discharge. Pt's children are moving back to the [place] and will be back tomorrow evening. She is not always eating as much as she should. She may drink a Premier at supper or some cheese but is not necessarily all protein component. Pt's Zio monitor malfunctioned so now she has to wear the monitor for another 2 weeks.  Subjective:   1. Elevated random blood glucose level [name] is on Ozempic 1 mg and does feel an improvement in cravings.  2. Vitamin D deficiency Pt's last Vit D level was 92 in February 2022. She is not on Vit D supplementation.  Assessment/Plan:   1. Elevated random blood glucose level Continue current treatment plan.  Refill- Semaglutide, 1 MG/DOSE, 4 MG/3ML SOPN; Inject 1 mg as directed once a week.  Dispense: 3 mL; Refill: 0  2. Vitamin D deficiency Low Vitamin D level contributes to fatigue and are associated with obesity, breast, and colon cancer. She will repeat Vit D level at next appt- pt  aware.  3. Obesity with current BMI of 36.3 [name] is currently in the action stage of change. As such, her goal is to continue with weight loss efforts. She has agreed to the Category 3 Plan.   Exercise goals: All adults should avoid inactivity. Some physical activity is better than none, and adults who participate in any amount of physical activity gain some health benefits. Activity as tolerated with Zio Patch.  Behavioral modification strategies: increasing lean protein intake, meal planning and cooking strategies, keeping healthy foods in the home, and planning for success.  [name] has agreed to follow-up with our clinic in 4 weeks. She was informed of the importance of frequent follow-up visits to maximize her success with intensive lifestyle modifications for her multiple health conditions.  Objective:   VITALS: Per patient if applicable, see vitals. GENERAL: Alert and in no acute distress. CARDIOPULMONARY: No increased WOB. Speaking in clear sentences.  PSYCH: Pleasant and cooperative. Speech normal rate and rhythm. Affect is appropriate. Insight and judgement are appropriate. Attention is focused, linear, and appropriate.  NEURO: Oriented as arrived to appointment on time with no prompting.   Lab Results  Component Value Date   CREATININE 1.23 (H) 08/31/2021   BUN 23 08/31/2021   NA 141 08/31/2021   K 4.4 08/31/2021   CL 104 08/31/2021   CO2 29 08/31/2021   Lab Results  Component Value Date   ALT 17 08/31/2021   AST 20 08/31/2021   ALKPHOS 143 (H) 01/31/2021   BILITOT 0.3 08/31/2021  Lab Results  Component Value Date   HGBA1C 5.2 01/31/2021   HGBA1C 5.5 09/23/2020   HGBA1C 5.8 (H) 05/11/2020   HGBA1C 6.2 (H) 12/01/2019   HGBA1C 6.1 (H) 08/26/2019   Lab Results  Component Value Date   INSULIN 14.2 01/31/2021   INSULIN 25.4 (H) 09/23/2020   INSULIN 21.0 06/01/2020   INSULIN 32.3 (H) 12/01/2019   Lab Results  Component Value Date   TSH 1.550 12/01/2019   Lab  Results  Component Value Date   CHOL 167 01/31/2021   HDL 39 (L) 01/31/2021   LDLCALC 86 01/31/2021   TRIG 253 (H) 01/31/2021   CHOLHDL 3.5 08/26/2019   Lab Results  Component Value Date   VD25OH 92.2 09/23/2020   VD25OH 62.9 12/01/2019   VD25OH 61.3 08/26/2019   Lab Results  Component Value Date   WBC 7.7 08/31/2021   HGB 13.5 08/31/2021   HCT 40.5 08/31/2021   MCV 85.6 08/31/2021   PLT 193 08/31/2021   No results found for: IRON, TIBC, FERRITIN  Attestation Statements:   Reviewed by clinician on day of visit: allergies, medications, problem list, medical history, surgical history, family history, social history, and previous encounter notes.  Coral Ceo, CMA, am acting as transcriptionist for Coralie Common, MD.   I have reviewed the above documentation for accuracy and completeness, and I agree with the above. - Coralie Common, MD

## 2021-10-05 ENCOUNTER — Other Ambulatory Visit: Payer: Self-pay | Admitting: Cardiology

## 2021-10-05 ENCOUNTER — Other Ambulatory Visit: Payer: Self-pay | Admitting: Physician Assistant

## 2021-10-05 DIAGNOSIS — M3501 Sicca syndrome with keratoconjunctivitis: Secondary | ICD-10-CM

## 2021-10-05 DIAGNOSIS — M0579 Rheumatoid arthritis with rheumatoid factor of multiple sites without organ or systems involvement: Secondary | ICD-10-CM

## 2021-10-05 NOTE — Telephone Encounter (Signed)
Next Visit: 01/31/2022  Last Visit: 08/31/2021  Labs: 08/31/2021 Creatinine is better.  CBC is normal.  Eye exam:  02/08/2021    Current Dose per office note 08/31/2021: Arava 20 mg daily and Plaquenil 200 mg 1 tablet by mouth twice daily Monday through Friday.  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

## 2021-10-10 DIAGNOSIS — J320 Chronic maxillary sinusitis: Secondary | ICD-10-CM | POA: Insufficient documentation

## 2021-10-10 DIAGNOSIS — R600 Localized edema: Secondary | ICD-10-CM | POA: Diagnosis not present

## 2021-10-10 DIAGNOSIS — K219 Gastro-esophageal reflux disease without esophagitis: Secondary | ICD-10-CM | POA: Insufficient documentation

## 2021-10-10 DIAGNOSIS — K118 Other diseases of salivary glands: Secondary | ICD-10-CM | POA: Diagnosis not present

## 2021-10-10 DIAGNOSIS — R682 Dry mouth, unspecified: Secondary | ICD-10-CM | POA: Diagnosis not present

## 2021-10-10 DIAGNOSIS — J3489 Other specified disorders of nose and nasal sinuses: Secondary | ICD-10-CM | POA: Diagnosis not present

## 2021-10-10 DIAGNOSIS — J342 Deviated nasal septum: Secondary | ICD-10-CM | POA: Diagnosis not present

## 2021-10-10 NOTE — Telephone Encounter (Signed)
Prior Authorization for TITR sent to [facility] via web portal. AUTH Number [id number]. VALID 10-26-21--01-24-22.

## 2021-10-11 ENCOUNTER — Other Ambulatory Visit: Payer: PPO

## 2021-10-11 DIAGNOSIS — E782 Mixed hyperlipidemia: Secondary | ICD-10-CM | POA: Diagnosis not present

## 2021-10-11 DIAGNOSIS — R7309 Other abnormal glucose: Secondary | ICD-10-CM | POA: Diagnosis not present

## 2021-10-11 DIAGNOSIS — N1832 Chronic kidney disease, stage 3b: Secondary | ICD-10-CM

## 2021-10-11 NOTE — Addendum Note (Signed)
Addended by: Freada Bergeron on: 10/11/2021 06:26 PM   Modules accepted: Orders

## 2021-10-12 LAB — LIPID PANEL

## 2021-10-18 ENCOUNTER — Encounter: Payer: Self-pay | Admitting: Physician Assistant

## 2021-10-18 LAB — COMPREHENSIVE METABOLIC PANEL
ALT: 14 IU/L (ref 0–32)
AST: 20 IU/L (ref 0–40)
Albumin/Globulin Ratio: 1.5 (ref 1.2–2.2)
Albumin: 4 g/dL (ref 3.8–4.8)
Alkaline Phosphatase: 133 IU/L — ABNORMAL HIGH (ref 44–121)
BUN/Creatinine Ratio: 18 (ref 12–28)
BUN: 21 mg/dL (ref 8–27)
Bilirubin Total: <0.2 mg/dL (ref 0.0–1.2)
CO2: 22 mmol/L (ref 20–29)
Calcium: 9.9 mg/dL (ref 8.7–10.3)
Chloride: 101 mmol/L (ref 96–106)
Creatinine, Ser: 1.19 mg/dL — ABNORMAL HIGH (ref 0.57–1.00)
Globulin, Total: 2.6 g/dL (ref 1.5–4.5)
Glucose: 93 mg/dL (ref 70–99)
Potassium: 4.1 mmol/L (ref 3.5–5.2)
Sodium: 138 mmol/L (ref 134–144)
Total Protein: 6.6 g/dL (ref 6.0–8.5)
eGFR: 49 mL/min/{1.73_m2} — ABNORMAL LOW (ref >59)

## 2021-10-18 LAB — LIPID PANEL
Chol/HDL Ratio: 5.3 ratio — ABNORMAL HIGH (ref 0.0–4.4)
Cholesterol, Total: 187 mg/dL (ref 100–199)
HDL: 35 mg/dL — ABNORMAL LOW (ref >39)
LDL Chol Calc (NIH): 64 mg/dL (ref 0–99)
Triglycerides: 576 mg/dL (ref 0–149)
VLDL Cholesterol Cal: 88 mg/dL — ABNORMAL HIGH (ref 5–40)

## 2021-10-18 LAB — HEMOGLOBIN A1C
Est. average glucose Bld gHb Est-mCnc: 108 mg/dL
Hgb A1c MFr Bld: 5.4 % (ref 4.8–5.6)

## 2021-10-18 LAB — TSH: TSH: 1 u[IU]/mL (ref 0.450–4.500)

## 2021-10-25 NOTE — Telephone Encounter (Signed)
Patient is scheduled for CPAP Titration on 11/17/21. ?Patient understands her titration study will be done at [facility] sleep lab. ?Patient understands she will receive a letter in a week or so detailing appointment, date, time, and location. ?Patient understands to call if she does not receive the letter  in a timely manner. ?Patient agrees with treatment and thanked me for call.  ?

## 2021-10-27 ENCOUNTER — Other Ambulatory Visit: Payer: Self-pay

## 2021-10-27 ENCOUNTER — Encounter (INDEPENDENT_AMBULATORY_CARE_PROVIDER_SITE_OTHER): Payer: Self-pay | Admitting: Family Medicine

## 2021-10-27 ENCOUNTER — Ambulatory Visit (INDEPENDENT_AMBULATORY_CARE_PROVIDER_SITE_OTHER): Payer: PPO | Admitting: Family Medicine

## 2021-10-27 VITALS — BP 105/70 | HR 83 | Temp 98.1°F | Ht 61.0 in | Wt 197.0 lb

## 2021-10-27 DIAGNOSIS — R7309 Other abnormal glucose: Secondary | ICD-10-CM

## 2021-10-27 DIAGNOSIS — E669 Obesity, unspecified: Secondary | ICD-10-CM | POA: Diagnosis not present

## 2021-10-27 DIAGNOSIS — J3089 Other allergic rhinitis: Secondary | ICD-10-CM

## 2021-10-27 DIAGNOSIS — E876 Hypokalemia: Secondary | ICD-10-CM | POA: Diagnosis not present

## 2021-10-27 DIAGNOSIS — N1831 Chronic kidney disease, stage 3a: Secondary | ICD-10-CM | POA: Diagnosis not present

## 2021-10-27 DIAGNOSIS — E781 Pure hyperglyceridemia: Secondary | ICD-10-CM

## 2021-10-27 DIAGNOSIS — Z6837 Body mass index (BMI) 37.0-37.9, adult: Secondary | ICD-10-CM | POA: Diagnosis not present

## 2021-10-27 DIAGNOSIS — I5032 Chronic diastolic (congestive) heart failure: Secondary | ICD-10-CM | POA: Diagnosis not present

## 2021-10-27 MED ORDER — CETIRIZINE HCL 10 MG PO TABS: 10.0000 mg | ORAL_TABLET | Freq: Every day | ORAL | 0 refills | Status: DC

## 2021-10-27 MED ORDER — SEMAGLUTIDE (1 MG/DOSE) 4 MG/3ML ~~LOC~~ SOPN: 1.0000 mg | PEN_INJECTOR | SUBCUTANEOUS | 0 refills | Status: DC

## 2021-10-27 MED ORDER — FENOFIBRATE 48 MG PO TABS: 48.0000 mg | ORAL_TABLET | Freq: Every day | ORAL | 0 refills | Status: DC

## 2021-10-29 DIAGNOSIS — I5032 Chronic diastolic (congestive) heart failure: Secondary | ICD-10-CM | POA: Diagnosis not present

## 2021-10-29 DIAGNOSIS — I493 Ventricular premature depolarization: Secondary | ICD-10-CM | POA: Diagnosis not present

## 2021-10-29 DIAGNOSIS — G4733 Obstructive sleep apnea (adult) (pediatric): Secondary | ICD-10-CM | POA: Diagnosis not present

## 2021-10-29 DIAGNOSIS — I10 Essential (primary) hypertension: Secondary | ICD-10-CM | POA: Diagnosis not present

## 2021-11-01 NOTE — Progress Notes (Signed)
? ? ? ?Chief Complaint:  ? ?OBESITY ?[name] is here to discuss her progress with her obesity treatment plan along with follow-up of her obesity related diagnoses. Farrell is on the Category 3 Plan and states she is following her eating plan approximately 80% of the time. Aldina states she has been walking more. ? ?Today's visit was #: 30 ?Starting weight: 239 lbs ?Starting date: 12/01/2019 ?Today's weight: 197 lbs ?Today's date: 10/27/2021 ?Total lbs lost to date: 12 ?Total lbs lost since last in-office visit: 0 ? ?Interim History: This is [name]'s first in office appointment since 08/23/2021. She has been eating out more frequently and she voices she has some extra fluid on her. She wants to get a bit more on track than she was previously. She doesn't want to get over 200 lbs again. She realizes she need to not get so discouraged. She also voices that she is drinking too many soft drinks. She has a few upcoming trips to the beach in the upcoming months. ? ?Subjective:  ? ?1. Hypertriglyceridemia ?[name]'s last triglycerides was 576 (previously 253). She is not on medications (no statin or fenofibrate).  ? ?2. Elevated random blood glucose level ?[name] is on Ozempic 1 mg, and she is doing well with no GI side effects. ? ?3. Allergic rhinitis due to other allergic trigger, unspecified seasonality ?[name] is on Zyrtec currently. Her side effects are better managed.  ? ?Assessment/Plan:  ? ?1. Hypertriglyceridemia ?We will recheck labs in 6 weeks. Clarisse agreed to start fenofibrate 48 mg PO daily with no refills. We discussed side effect risks of pancreatitis and LFT elevations.  ? ?- fenofibrate (TRICOR) 48 MG tablet; Take 1 tablet (48 mg total) by mouth daily.  Dispense: 30 tablet; Refill: 0 ? ?2. Elevated random blood glucose level ?Kortlynn will continue Ozempic 1 mg, and we will refill for 1 month. ? ?- Semaglutide, 1 MG/DOSE, 4 MG/3ML SOPN; Inject 1 mg as directed once a week.  Dispense: 3 mL; Refill: 0 ? ?3. Allergic rhinitis due to  other allergic trigger, unspecified seasonality ?Alys will continue Zyrtec, and we will refill for 90 days with no refills. ? ?- cetirizine (ZYRTEC) 10 MG tablet; Take 1 tablet (10 mg total) by mouth daily.  Dispense: 90 tablet; Refill: 0 ? ?4. Obesity with current BMI of 37.3 ?[name] is currently in the action stage of change. As such, her goal is to continue with weight loss efforts. She has agreed to the Category 3 Plan.  ? ?Exercise goals: All adults should avoid inactivity. Some physical activity is better than none, and adults who participate in any amount of physical activity gain some health benefits. ? ?Behavioral modification strategies: increasing lean protein intake, meal planning and cooking strategies, keeping healthy foods in the home, and planning for success. ? ?[name] has agreed to follow-up with our clinic in 3 weeks. She was informed of the importance of frequent follow-up visits to maximize her success with intensive lifestyle modifications for her multiple health conditions.  ? ?Objective:  ? ?Blood pressure 105/70, pulse 83, temperature 98.1 ?F (36.7 ?C), height '5\' 1"'$  (1.549 m), weight 197 lb (89.4 kg), SpO2 98 %. ?Body mass index is 37.22 kg/m?. ? ?General: Cooperative, alert, well developed, in no acute distress. ?HEENT: Conjunctivae and lids unremarkable. ?Cardiovascular: Regular rhythm.  ?Lungs: Normal work of breathing. ?Neurologic: No focal deficits.  ? ?Lab Results  ?Component Value Date  ? CREATININE 1.19 (H) 10/11/2021  ? BUN 21 10/11/2021  ? NA 138 10/11/2021  ?  K 4.1 10/11/2021  ? CL 101 10/11/2021  ? CO2 22 10/11/2021  ? ?Lab Results  ?Component Value Date  ? ALT 14 10/11/2021  ? AST 20 10/11/2021  ? ALKPHOS 133 (H) 10/11/2021  ? BILITOT <0.2 10/11/2021  ? ?Lab Results  ?Component Value Date  ? HGBA1C 5.4 10/11/2021  ? HGBA1C 5.2 01/31/2021  ? HGBA1C 5.5 09/23/2020  ? HGBA1C 5.8 (H) 05/11/2020  ? HGBA1C 6.2 (H) 12/01/2019  ? ?Lab Results  ?Component Value Date  ? INSULIN 14.2  01/31/2021  ? INSULIN 25.4 (H) 09/23/2020  ? INSULIN 21.0 06/01/2020  ? INSULIN 32.3 (H) 12/01/2019  ? ?Lab Results  ?Component Value Date  ? TSH 1.000 10/11/2021  ? ?Lab Results  ?Component Value Date  ? CHOL 187 10/11/2021  ? HDL 35 (L) 10/11/2021  ? [place] 64 10/11/2021  ? TRIG 576 (HH) 10/11/2021  ? CHOLHDL 5.3 (H) 10/11/2021  ? ?Lab Results  ?Component Value Date  ? VD25OH 92.2 09/23/2020  ? VD25OH 62.9 12/01/2019  ? VD25OH 61.3 08/26/2019  ? ?Lab Results  ?Component Value Date  ? WBC 7.7 08/31/2021  ? HGB 13.5 08/31/2021  ? HCT 40.5 08/31/2021  ? MCV 85.6 08/31/2021  ? PLT 193 08/31/2021  ? ?No results found for: IRON, TIBC, FERRITIN ? ?Attestation Statements:  ? ?Reviewed by clinician on day of visit: allergies, medications, problem list, medical history, surgical history, family history, social history, and previous encounter notes. ? ? ?I, Trixie Dredge, am acting as transcriptionist for Coralie Common, MD. ? ?I have reviewed the above documentation for accuracy and completeness, and I agree with the above. Coralie Common, MD ? ? ?

## 2021-11-02 ENCOUNTER — Other Ambulatory Visit: Payer: Self-pay | Admitting: Cardiology

## 2021-11-02 ENCOUNTER — Telehealth: Payer: Self-pay

## 2021-11-02 DIAGNOSIS — G4733 Obstructive sleep apnea (adult) (pediatric): Secondary | ICD-10-CM

## 2021-11-02 DIAGNOSIS — I5032 Chronic diastolic (congestive) heart failure: Secondary | ICD-10-CM

## 2021-11-02 DIAGNOSIS — I493 Ventricular premature depolarization: Secondary | ICD-10-CM

## 2021-11-02 NOTE — Telephone Encounter (Signed)
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 Lovena Le or Dr. Quentin Ore for further evaluation.  Please get an echo to assess LVF. Please get a coronary Ca score.  ?

## 2021-11-02 NOTE — Telephone Encounter (Signed)
The patient has been notified of the result and verbalized understanding.  All questions (if any) were answered. ?Antonieta Iba, RN 11/02/2021 4:21 PM  ?Referral has been placed. Echocardiogram and CA score have been ordered.  ?

## 2021-11-15 ENCOUNTER — Ambulatory Visit
Admission: RE | Admit: 2021-11-15 | Discharge: 2021-11-15 | Disposition: A | Discharge status: Home / Self Care | Payer: Self-pay | Source: Ambulatory Visit | Attending: Cardiology | Admitting: Cardiology

## 2021-11-15 ENCOUNTER — Ambulatory Visit (HOSPITAL_COMMUNITY): Payer: PPO | Attending: Cardiology

## 2021-11-15 DIAGNOSIS — I493 Ventricular premature depolarization: Secondary | ICD-10-CM

## 2021-11-15 DIAGNOSIS — G4733 Obstructive sleep apnea (adult) (pediatric): Secondary | ICD-10-CM | POA: Insufficient documentation

## 2021-11-15 DIAGNOSIS — I5032 Chronic diastolic (congestive) heart failure: Secondary | ICD-10-CM

## 2021-11-15 LAB — ECHOCARDIOGRAM COMPLETE
Area-P 1/2: 3.03 cm2
S' Lateral: 2.8 cm

## 2021-11-17 ENCOUNTER — Ambulatory Visit (HOSPITAL_BASED_OUTPATIENT_CLINIC_OR_DEPARTMENT_OTHER): Payer: PPO | Attending: Cardiology | Admitting: Cardiology

## 2021-11-17 ENCOUNTER — Other Ambulatory Visit: Payer: Self-pay | Admitting: Adult Health

## 2021-11-17 ENCOUNTER — Encounter (HOSPITAL_BASED_OUTPATIENT_CLINIC_OR_DEPARTMENT_OTHER): Payer: Self-pay | Admitting: Cardiology

## 2021-11-18 ENCOUNTER — Encounter: Payer: Self-pay | Admitting: Internal Medicine

## 2021-11-18 ENCOUNTER — Ambulatory Visit: Payer: PPO | Admitting: Internal Medicine

## 2021-11-18 VITALS — BP 90/58 | HR 71 | Ht 61.0 in | Wt 197.0 lb

## 2021-11-18 DIAGNOSIS — I493 Ventricular premature depolarization: Secondary | ICD-10-CM

## 2021-11-18 DIAGNOSIS — Z5181 Encounter for therapeutic drug level monitoring: Secondary | ICD-10-CM | POA: Diagnosis not present

## 2021-11-18 DIAGNOSIS — Z79899 Other long term (current) drug therapy: Secondary | ICD-10-CM | POA: Diagnosis not present

## 2021-11-18 LAB — HEPATIC FUNCTION PANEL
ALT: 13 IU/L (ref 0–32)
AST: 24 IU/L (ref 0–40)
Albumin: 4.5 g/dL (ref 3.8–4.8)
Alkaline Phosphatase: 98 IU/L (ref 44–121)
Bilirubin Total: 0.5 mg/dL (ref 0.0–1.2)
Bilirubin, Direct: 0.16 mg/dL (ref 0.00–0.40)
Total Protein: 7 g/dL (ref 6.0–8.5)

## 2021-11-18 LAB — BASIC METABOLIC PANEL
BUN/Creatinine Ratio: 29 — ABNORMAL HIGH (ref 12–28)
BUN: 41 mg/dL — ABNORMAL HIGH (ref 8–27)
CO2: 23 mmol/L (ref 20–29)
Calcium: 9.5 mg/dL (ref 8.7–10.3)
Chloride: 100 mmol/L (ref 96–106)
Creatinine, Ser: 1.41 mg/dL — ABNORMAL HIGH (ref 0.57–1.00)
Glucose: 84 mg/dL (ref 70–99)
Potassium: 4.3 mmol/L (ref 3.5–5.2)
Sodium: 143 mmol/L (ref 134–144)
eGFR: 40 mL/min/{1.73_m2} — ABNORMAL LOW (ref >59)

## 2021-11-18 LAB — TSH: TSH: 0.883 u[IU]/mL (ref 0.450–4.500)

## 2021-11-18 MED ORDER — AMIODARONE HCL 200 MG PO TABS: 200.0000 mg | ORAL_TABLET | Freq: Every day | ORAL | 3 refills | Status: DC

## 2021-11-18 NOTE — Patient Instructions (Addendum)
Medication Instructions:  ?Your physician has recommended you make the following change in your medication:  ? ? START taking amiodarone 200 mg-  Take one tablet by mouth daily ? ?Labwork: ?You will get lab work today:  liver panel, TSH and BMP ? ?Testing/Procedures: ?None ordered. ? ?Follow-Up: ?Your physician wants you to follow-up in: 5 months with Cristopher Peru, MD  ? ? ?Any Other Special Instructions Will Be Listed Below (If Applicable). ? ?If you need a refill on your cardiac medications before your next appointment, please call your pharmacy.  ? ?Amiodarone Tablets ?What is this medication? ?AMIODARONE (a MEE oh da rone) prevents and treats a fast or irregular heartbeat (arrhythmia). It works by slowing down overactive electric signals in the heart, which stabilizes your heart rhythm. It belongs to a group of medications called antiarrhythmics. ?This medicine may be used for other purposes; ask your health care provider or pharmacist if you have questions. ?COMMON BRAND NAME(S): Cordarone, Pacerone ?What should I tell my care team before I take this medication? ?They need to know if you have any of these conditions: ?Liver disease ?Lung disease ?Other heart problems ?Thyroid disease ?An unusual or allergic reaction to amiodarone, iodine, other medications, foods, dyes, or preservatives ?Pregnant or trying to get pregnant ?Breast-feeding ?How should I use this medication? ?Take this medication by mouth with a glass of water. Follow the directions on the prescription label. You can take this medication with or without food. However, you should always take it the same way each time. Take your doses at regular intervals. Do not take your medication more often than directed. Do not stop taking except on the advice of your care team. ?A special MedGuide will be given to you by the pharmacist with each prescription and refill. Be sure to read this information carefully each time. ?Talk to your care team regarding the  use of this medication in children. Special care may be needed. ?Overdosage: If you think you have taken too much of this medicine contact a poison control center or emergency room at once. ?NOTE: This medicine is only for you. Do not share this medicine with others. ?What if I miss a dose? ?If you miss a dose, take it as soon as you can. If it is almost time for your next dose, take only that dose. Do not take double or extra doses. ?What may interact with this medication? ?Do not take this medication with any of the following: ?Abarelix ?Apomorphine ?Arsenic trioxide ?Certain antibiotics like erythromycin, gemifloxacin, levofloxacin, pentamidine ?Certain medications for depression like amoxapine, tricyclic antidepressants ?Certain medications for fungal infections like fluconazole, itraconazole, ketoconazole, posaconazole, voriconazole ?Certain medications for irregular heartbeat like disopyramide, dronedarone, ibutilide, propafenone, sotalol ?Certain medications for malaria like chloroquine, halofantrine ?Cisapride ?Droperidol ?Haloperidol ?[place] ?Maprotiline ?Methadone ?Phenothiazines like chlorpromazine, mesoridazine, thioridazine ?Pimozide ?Ranolazine ?Red yeast rice ?Vardenafil ?This medication may also interact with the following: ?Antiviral medications for HIV or AIDS ?Certain medications for blood pressure, heart disease, irregular heart beat ?Certain medications for cholesterol like atorvastatin, cerivastatin, lovastatin, simvastatin ?Certain medications for hepatitis C like sofosbuvir and ledipasvir; sofosbuvir ?Certain medications for seizures like phenytoin ?Certain medications for thyroid problems ?Certain medications that treat or prevent blood clots like warfarin ?Cholestyramine ?Cimetidine ?Clopidogrel ?Cyclosporine ?Dextromethorphan ?Diuretics ?Dofetilide ?Fentanyl ?General anesthetics ?Grapefruit juice ?Lidocaine ?Loratadine ?Methotrexate ?Other medications that prolong the QT interval (cause  an abnormal heart rhythm) ?Procainamide ?Quinidine ?Rifabutin, rifampin, or rifapentine ?[place] ?Trazodone ?Ziprasidone ?This list may not describe all possible interactions. Give  your health care provider a list of all the medicines, herbs, non-prescription drugs, or dietary supplements you use. Also tell them if you smoke, drink alcohol, or use illegal drugs. Some items may interact with your medicine. ?What should I watch for while using this medication? ?Your condition will be monitored closely when you first begin therapy. Often, this medication is first started in a hospital or other monitored health care setting. Once you are on maintenance therapy, visit your care team for regular checks on your progress. Because your condition and use of this medication carry some risk, it is a good idea to carry an identification card, necklace or bracelet with details of your condition, medications, and care team. ?You may get drowsy or dizzy. Do not drive, use machinery, or do anything that needs mental alertness until you know how this medication affects you. Do not stand or sit up quickly, especially if you are an older patient. This reduces the risk of dizzy or fainting spells. ?This medication can make you more sensitive to the sun. Keep out of the sun. If you cannot avoid being in the sun, wear protective clothing and use sunscreen. Do not use sun lamps or tanning beds/booths. ?You should have regular eye exams before and during treatment. Call your care team if you have blurred vision, see halos, or your eyes become sensitive to light. Your eyes may get dry. It may be helpful to use a lubricating eye solution or artificial tears solution. ?If you are going to have surgery or a procedure that requires contrast dyes, tell your care team that you are taking this medication. ?What side effects may I notice from receiving this medication? ?Side effects that you should report to your care team as soon as  possible: ?Allergic reactions--skin rash, itching, hives, swelling of the face, lips, tongue, or throat ?Bluish-gray skin ?Change in vision such as blurry vision, seeing halos around lights, vision loss ?Heart failure--shortness of breath, swelling of the ankles, feet, or hands, sudden weight gain, unusual weakness or fatigue ?Heart rhythm changes--fast or irregular heartbeat, dizziness, feeling faint or lightheaded, chest pain, trouble breathing ?High thyroid levels (hyperthyroidism)--fast or irregular heartbeat, weight loss, excessive sweating or sensitivity to heat, tremors or shaking, anxiety, nervousness, irregular menstrual cycle or spotting ?Liver injury--right upper belly pain, loss of appetite, nausea, light-colored stool, dark yellow or brown urine, yellowing skin or eyes, unusual weakness or fatigue ?Low thyroid levels (hypothyroidism)--unusual weakness or fatigue, sensitivity to cold, constipation, hair loss, dry skin, weight gain, feelings of depression ?Lung injury--shortness of breath or trouble breathing, cough, spitting up blood, chest pain, fever ?Pain, tingling, or numbness in the hands or feet, muscle weakness, trouble walking, loss of balance or coordination ?Side effects that usually do not require medical attention (report to your care team if they continue or are bothersome): ?Nausea ?Vomiting ?This list may not describe all possible side effects. Call your doctor for medical advice about side effects. You may report side effects to FDA at 1-800-FDA-1088. ?Where should I keep my medication? ?Keep out of the reach of children and pets. ?Store at room temperature between 20 and 25 degrees C (68 and 77 degrees F). Protect from light. Keep container tightly closed. Throw away any unused medication after the expiration date. ?NOTE: This sheet is a summary. It may not cover all possible information. If you have questions about this medicine, talk to your doctor, pharmacist, or health care  provider. ?? 2022 [employer]vier/Gold Standard (2020-09-24 00:00:00) ? ? ? ? ?

## 2021-11-18 NOTE — Progress Notes (Signed)
? ? ? ? ?HPI ?[name] is referred by Dr. Radford Pax for evaluation of PVC's. 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 She wore a cardiac monitor which demonstrated 16% PVC's. She has not had syncope. She has preserved LV function by echo. She had a marked elevated CT calcium score. She does not have angina. She has lost weight.  ?Allergies  ?Allergen Reactions  ? Sulfa Antibiotics Hives and Itching  ?  Flushing also  ? Cymbalta [Duloxetine Hcl] Other (See Comments)  ?  Caused a manic reaction  ? Demerol Hives and Other (See Comments)  ?  Fever also  ? Ivp Dye [Iodinated Contrast Media] Hives and Other (See Comments)  ?  Fever also  ? Morphine And Related Other (See Comments)  ?  ineffective  ? Sulfasalazine Other (See Comments)  ?  Reaction ??  ? Adhesive [Tape] Rash  ? ? ? ?Current Outpatient Medications  ?Medication Sig Dispense Refill  ? albuterol (VENTOLIN HFA) 108 (90 Base) MCG/ACT inhaler Inhale 2 puffs into the lungs every 4 (four) hours as needed for wheezing or shortness of breath. 18 g 0  ? aspirin EC 81 MG tablet Take 81 mg by mouth at bedtime.    ? cetirizine (ZYRTEC) 10 MG tablet Take 1 tablet (10 mg total) by mouth daily. 90 tablet 0  ? cholestyramine (QUESTRAN) 4 GM/DOSE powder Take by mouth 3 (three) times daily with meals. As needed    ? cycloSPORINE (RESTASIS) 0.05 % ophthalmic emulsion Place 2 drops into both eyes 2 (two) times daily.    ? diphenoxylate-atropine (LOMOTIL) 2.5-0.025 MG per tablet Take 1 tablet by mouth 4 (four) times daily as needed for diarrhea or loose stools.    ? doxylamine, Sleep, (UNISOM) 25 MG tablet Take 1 tablet (25 mg total) by mouth at bedtime as needed. 30 tablet 0  ? fenofibrate (TRICOR) 48 MG tablet Take 1 tablet (48 mg total) by mouth daily. 30 tablet 0  ? fluticasone (FLONASE) 50 MCG/ACT nasal spray Place 1 spray into both nostrils daily. (Patient taking differently: Place 1 spray into both nostrils as needed.) 16 g 0  ? gabapentin  (NEURONTIN) 300 MG capsule TAKE 2 CAPSULES BY MOUTH 2 TIMES DAILY. 360 capsule 3  ? hydroxychloroquine (PLAQUENIL) 200 MG tablet TAKE 2 TABLETS BY MOUTH ONCE DAILY MONDAY THROUGH FRIDAY 120 tablet 0  ? hyoscyamine (ANASPAZ) 0.125 MG TBDP disintergrating tablet Place 0.25 mg under the tongue every 6 (six) hours as needed for cramping.     ? leflunomide (ARAVA) 20 MG tablet TAKE 1 TABLET BY MOUTH DAILY. 90 tablet 0  ? lidocaine (LIDODERM) 5 % PLACE 1 PATCH ONTO THE SKIN DAILY AS NEEDED FOR PAIN. REMOVE AND DISCARD PATCH WITHIN 12 HOURS OR AS DIRECTED BY MD 90 patch 0  ? metoprolol succinate (TOPROL-XL) 25 MG 24 hr tablet Take 0.5 tablets (12.5 mg total) by mouth daily. 45 tablet 3  ? NONFORMULARY OR COMPOUNDED ITEM Triamcinolone 0.1% & Silvadene cream 1:1- Apply as directed to affected areas as needed    ? omeprazole (PRILOSEC) 40 MG capsule Take 40 mg by mouth as needed.    ? potassium chloride (KLOR-CON) 10 MEQ tablet Take 2 tablets (20 mEq total) by mouth daily. 180 tablet 3  ? Probiotic Product (PROBIOTIC DAILY PO) Take by mouth. Ultraflora IB Probiotic    ? promethazine (PHENERGAN) 25 MG tablet TAKE 1 TABLET BY MOUTH EVERY 6 HOURS AS NEEDED. 90 tablet 0  ?  Semaglutide, 1 MG/DOSE, 4 MG/3ML SOPN Inject 1 mg as directed once a week. 3 mL 0  ? spironolactone (ALDACTONE) 25 MG tablet Take 1 tablet (25 mg total) by mouth daily. 180 tablet 3  ? torsemide 40 MG TABS Take 40 mg by mouth daily. 30 tablet 0  ? UNABLE TO FIND CPAP: AT bedtime; setting is "12"    ? ?No current facility-administered medications for this visit.  ? ? ? ?Past Medical History:  ?Diagnosis Date  ? Arthritis   ? Rheumatoid arthritis,   ? Back pain   ? Blood transfusion   ? 1981  ? CHF (congestive heart failure) ([place])   ? Chronic diastolic CHF (congestive heart failure) ([place])   ? diastolic   ? DDD (degenerative disc disease), cervical   ? DDD (degenerative disc disease), lumbar   ? Edema of both lower extremities   ? Fibromyalgia   ? Gallbladder  problem   ? GERD (gastroesophageal reflux disease)   ? Heart murmur   ? as a child  ? Hiatal hernia   ? sjorgens syndrome  ? High blood pressure   ? High cholesterol   ? IBS (irritable bowel syndrome)   ? Joint pain   ? Numbness and tingling of both feet   ? Numbness of fingers   ? OSA (obstructive sleep apnea)   ? Osteoarthritis   ? Peripheral autonomic neuropathy of unknown cause   ? Pneumonia 07/2018  ? Polyneuropathy   ? Pre-diabetes   ? PVC (premature ventricular contraction)   ? Raynaud disease   ? Rheumatoid arthritis ([place])   ? Sjogren's disease ([place])   ? Sleep apnea   ? SOB (shortness of breath)   ? Swallowing difficulty   ? Urticaria   ? Vitamin D deficiency   ? ? ?ROS: ? ? All systems reviewed and negative except as noted in the HPI. ? ? ?Past Surgical History:  ?Procedure Laterality Date  ? ABDOMINAL HYSTERECTOMY    ? BTL, BSO  ? ADENOIDECTOMY    ? BREAST SURGERY    ? mass removal   ? CHOLECYSTECTOMY    ? dental implants    ? DIAGNOSTIC LAPAROSCOPY    ? x3  ? KNEE ARTHROSCOPY    ? x2  ? MASS EXCISION Left 03/22/2017  ? Procedure: EXCISION OF LEFT BREAST MASS;  Surgeon: Donnie Mesa, MD;  Location: [facility];  Service: General;  Laterality: Left;  ? patotid cystectomy    ? RIGHT/LEFT HEART CATH AND CORONARY ANGIOGRAPHY N/A 07/25/2019  ? Procedure: RIGHT/LEFT HEART CATH AND CORONARY ANGIOGRAPHY;  Surgeon: Jolaine Artist, MD;  Location: [facility];  Service: Cardiovascular;  Laterality: N/A;  ? [place]  ? left ankle and tibia  ? TONSILLECTOMY    ? TOTAL KNEE ARTHROPLASTY  06/21/2011  ? Procedure: TOTAL KNEE ARTHROPLASTY;  Surgeon: Gearlean Alf;  Location: [facility];  Service: Orthopedics;  Laterality: Right;  ? TUBAL LIGATION    ? ? ? ?Family History  ?Problem Relation Age of Onset  ? Alzheimer's disease Mother   ? Heart attack Mother   ? Hypertension Mother   ? Glaucoma Mother   ? High Cholesterol Mother   ? Heart disease Mother   ? Depression Mother   ? Cancer Father   ? High  Cholesterol Father   ? Heart disease Father   ? Sleep apnea Father   ? Heart attack Brother   ? Heart disease Brother   ?  Glaucoma Brother   ? Hyperlipidemia Brother   ? Glaucoma Brother   ? Asthma Son   ? Allergic rhinitis Neg Hx   ? Angioedema Neg Hx   ? Eczema Neg Hx   ? Urticaria Neg Hx   ? Neuropathy Neg Hx   ? ? ? ?Social History  ? ?Socioeconomic History  ? Marital status: Widowed  ?  Spouse name: Not on file  ? Number of children: 2  ? Years of education: AS  ? Highest education level: Not on file  ?Occupational History  ? Occupation: retired Marine scientist  ? Occupation: Therapist, sports  ?Tobacco Use  ? Smoking status: Never  ? Smokeless tobacco: Never  ?Vaping Use  ? Vaping Use: Never used  ?Substance and Sexual Activity  ? Alcohol use: Yes  ?  Comment: socially   ? Drug use: No  ? Sexual activity: Never  ?Other Topics Concern  ? Not on file  ?Social History Narrative  ? Lives at home alone  ? Right-handed  ? Drinks 1 or less cups of coffee and 2 servings of either tea or soda per day  ? ?Social Determinants of Health  ? ?Financial Resource Strain: Not on file  ?Food Insecurity: Not on file  ?Transportation Needs: Not on file  ?Physical Activity: Not on file  ?Stress: Not on file  ?Social Connections: Not on file  ?Intimate Partner Violence: Not on file  ? ? ? ?BP (!) 90/58   Pulse 71   Ht '5\' 1"'$  (1.549 m)   Wt 197 lb (89.4 kg)   SpO2 98%   BMI 37.22 kg/m?  ? ?Physical Exam: ? ?Well appearing NAD ?HEENT: Unremarkable ?Neck:  No JVD, no thyromegally ?Lymphatics:  No adenopathy ?Back:  No CVA tenderness ?Lungs:  Clear with no wheezes ?HEART:  Regular rate rhythm, no murmurs, no rubs, no clicks ?Abd:  soft, positive bowel sounds, no organomegally, no rebound, no guarding ?Ext:  2 plus pulses, no edema, no cyanosis, no clubbing ?Skin:  No rashes no nodules ?Neuro:  CN II through XII intact, motor grossly intact ? ?EKG - nsr ? ?ECG review demonstrates NSR with PVC's the morphology is within the septum with a QR complex in V1 and  inferior axis. ? ?Assess/Plan:  ?PVC's - I discussed the treatment options with the patient and I do not think catheter ablation an option as the PVC's appear to be coming from the septum deep with the same morphology as our l

## 2021-11-21 ENCOUNTER — Ambulatory Visit (INDEPENDENT_AMBULATORY_CARE_PROVIDER_SITE_OTHER): Payer: PPO | Admitting: Family Medicine

## 2021-11-21 ENCOUNTER — Telehealth: Payer: Self-pay

## 2021-11-21 ENCOUNTER — Encounter (INDEPENDENT_AMBULATORY_CARE_PROVIDER_SITE_OTHER): Payer: Self-pay

## 2021-11-21 DIAGNOSIS — E782 Mixed hyperlipidemia: Secondary | ICD-10-CM

## 2021-11-21 MED ORDER — ROSUVASTATIN CALCIUM 10 MG PO TABS: 10.0000 mg | ORAL_TABLET | Freq: Every day | ORAL | 3 refills | Status: DC

## 2021-11-21 NOTE — Telephone Encounter (Signed)
-----   Message from Sueanne Margarita, MD sent at 11/18/2021 10:14 AM EDT ----- ?Elevated coronary calcium score 856 which puts her in the 87 percentile for age race and sex matched controls.  Her LDL is at goal but her triglycerides 2 months ago was very high.  I would like her referred to Dr. Debara Pickett in advanced lipid clinic.  For now I would like her to start on Crestor 10 mg daily with fasting lipid and ALT in 6 weeks ?

## 2021-11-21 NOTE — Telephone Encounter (Signed)
The patient has been notified of the result and verbalized understanding.  All questions (if any) were answered. ?Antonieta Iba, RN 11/21/2021 3:05 PM  ?Rx has been sent in for crestor. Referral has been placed. Labs ordered.  ?

## 2021-12-01 ENCOUNTER — Other Ambulatory Visit (INDEPENDENT_AMBULATORY_CARE_PROVIDER_SITE_OTHER): Payer: Self-pay | Admitting: Family Medicine

## 2021-12-01 DIAGNOSIS — R7309 Other abnormal glucose: Secondary | ICD-10-CM

## 2021-12-01 DIAGNOSIS — E781 Pure hyperglyceridemia: Secondary | ICD-10-CM

## 2021-12-01 NOTE — Telephone Encounter (Signed)
Dr.Ukleja 

## 2021-12-04 ENCOUNTER — Encounter (HOSPITAL_BASED_OUTPATIENT_CLINIC_OR_DEPARTMENT_OTHER): Payer: Self-pay | Admitting: Cardiology

## 2021-12-05 ENCOUNTER — Other Ambulatory Visit: Payer: Self-pay | Admitting: Physician Assistant

## 2021-12-05 ENCOUNTER — Other Ambulatory Visit (INDEPENDENT_AMBULATORY_CARE_PROVIDER_SITE_OTHER): Payer: Self-pay | Admitting: Family Medicine

## 2021-12-05 ENCOUNTER — Telehealth (INDEPENDENT_AMBULATORY_CARE_PROVIDER_SITE_OTHER): Payer: Self-pay | Admitting: Family Medicine

## 2021-12-05 DIAGNOSIS — R7309 Other abnormal glucose: Secondary | ICD-10-CM

## 2021-12-05 DIAGNOSIS — J3089 Other allergic rhinitis: Secondary | ICD-10-CM

## 2021-12-05 DIAGNOSIS — E781 Pure hyperglyceridemia: Secondary | ICD-10-CM

## 2021-12-05 DIAGNOSIS — R739 Hyperglycemia, unspecified: Secondary | ICD-10-CM

## 2021-12-05 DIAGNOSIS — Z1231 Encounter for screening mammogram for malignant neoplasm of breast: Secondary | ICD-10-CM

## 2021-12-05 NOTE — Telephone Encounter (Signed)
Dr.Ukleja 

## 2021-12-05 NOTE — Telephone Encounter (Signed)
LAST APPOINTMENT DATE:11/06/21 ?NEXT APPOINTMENT DATE: 12/20/21 ? ? ?[facility] [address] ?[address] ?[address] ?[place] [address] ?Phone: [phone] Fax: [phone] ? ?Patient is requesting a refill of the following medications: ?Pending Prescriptions:                       Disp   Refills ?  fenofibrate (TRICOR) 48 MG tablet          30 tab*0       ?Sig: Take 1 tablet (48 mg total) by mouth daily. ?  Semaglutide, 1 MG/DOSE, 4 MG/3ML SOPN      3 mL   0       ?Sig: Inject 1 mg as directed once a week. ?  cetirizine (ZYRTEC) 10 MG tablet           90 tab*0       ?Sig: Take 1 tablet (10 mg total) by mouth daily. ? ? ?Date last filled: 10/27/21 ?Previously prescribed by Dr Jearld Shines ? ?Lab Results ?     Component                Value               Date                 ?     HGBA1C                   5.4                 10/11/2021           ?     HGBA1C                   5.2                 01/31/2021           ?     HGBA1C                   5.5                 09/23/2020           ?Lab Results ?     Component                Value               Date                 ?     MICROALBUR               80                  07/28/2020           ?     [place]                  64                  10/11/2021           ?     CREATININE               1.41 (H)            11/18/2021           ?Lab Results ?  Component                Value               Date                 ?     VD25OH                   92.2                09/23/2020           ?     VD25OH                   62.9                12/01/2019           ?     VD25OH                   61.3                08/26/2019           ? ?BP Readings from Last 3 Encounters: ?11/18/21 : (!) 90/58 ?10/27/21 : 105/70 ?09/27/21 : 103/69 ? ?

## 2021-12-05 NOTE — Telephone Encounter (Signed)
Patient called to report appt 11/21/21 was rescheduled due to provider out of the office. Next appt 12/20/21. Please approved refills for  Ozempic (1 dose left) Tricor (zero refills) last written 10/27/21. Pt requesting more than 30 day supply called in.

## 2021-12-05 NOTE — Telephone Encounter (Signed)
Message sent to Dr Jearld Shines

## 2021-12-06 MED ORDER — FENOFIBRATE 48 MG PO TABS: 48.0000 mg | ORAL_TABLET | Freq: Every day | ORAL | 0 refills | Status: DC

## 2021-12-06 MED ORDER — SEMAGLUTIDE (1 MG/DOSE) 4 MG/3ML ~~LOC~~ SOPN: 1.0000 mg | PEN_INJECTOR | SUBCUTANEOUS | 0 refills | Status: DC

## 2021-12-07 MED ORDER — FENOFIBRATE 48 MG PO TABS: 48.0000 mg | ORAL_TABLET | Freq: Every day | ORAL | 0 refills | Status: DC

## 2021-12-07 MED ORDER — SEMAGLUTIDE (1 MG/DOSE) 4 MG/3ML ~~LOC~~ SOPN: 1.0000 mg | PEN_INJECTOR | SUBCUTANEOUS | 0 refills | Status: DC

## 2021-12-07 MED ORDER — CETIRIZINE HCL 10 MG PO TABS: 10.0000 mg | ORAL_TABLET | Freq: Every day | ORAL | 0 refills | Status: DC

## 2021-12-13 ENCOUNTER — Ambulatory Visit (HOSPITAL_BASED_OUTPATIENT_CLINIC_OR_DEPARTMENT_OTHER): Payer: PPO | Attending: Cardiology | Admitting: Cardiology

## 2021-12-13 VITALS — Ht 61.5 in | Wt 192.0 lb

## 2021-12-13 DIAGNOSIS — G4733 Obstructive sleep apnea (adult) (pediatric): Secondary | ICD-10-CM

## 2021-12-16 NOTE — Procedures (Signed)
? ?  Patient Name: [name], [name] ?Study Date: 12/13/2021 ?Gender: Female ?D.O.B: 1951/06/10 ?Age (years): 67 ?Referring Provider: Fransico Him MD, ABSM ?Height (inches): 62 ?Interpreting Physician: Fransico Him MD, ABSM ?Weight (lbs): 192 ?RPSGT: Jorge Ny ?BMI: 36 ?MRN: [id number] ?Neck Size: 14.50 ? ?CLINICAL INFORMATION ?The patient is referred for a CPAP titration to treat sleep apnea. ? ?SLEEP STUDY TECHNIQUE ?As per the AASM Manual for the Scoring of Sleep and Associated Events v2.3 (April 2016) with a hypopnea requiring 4% desaturations. ? ?The channels recorded and monitored were frontal, central and occipital EEG, electrooculogram (EOG), submentalis EMG (chin), nasal and oral airflow, thoracic and abdominal wall motion, anterior tibialis EMG, snore microphone, electrocardiogram, and pulse oximetry. Continuous positive airway pressure (CPAP) was initiated at the beginning of the study and titrated to treat sleep-disordered breathing. ? ?MEDICATIONS ?Medications self-administered by patient taken the night of the study : CRESTOR, SPIRONOLACTONE, METOPROLOL SUCCINATE, PLAQUENIL, GABAPENTIN, TRICOR, UNISOM, ASPIRIN, ZYRTEC, IBUPROFEN ? ?TECHNICIAN COMMENTS ?Comments added by technician: None ?Comments added by scorer: N/A ? ?RESPIRATORY PARAMETERS ?Optimal PAP Pressure (cm): 10  ?AHI at Optimal Pressure (/hr):N/A ?Overall Minimal O2 (%):80.0  ?Supine % at Optimal Pressure (%): N/A ?Minimal O2 at Optimal Pressure (%): 80.0  ? ?SLEEP ARCHITECTURE ?The study was initiated at 10:59:40 PM and ended at 5:18:54 AM. ? ?Sleep onset time was 4.0 minutes and the sleep efficiency was 97.8%. The total sleep time was 370.7 minutes. ? ?The patient spent 1.6% of the night in stage N1 sleep, 52.1% in stage N2 sleep, 33.6% in stage N3 and 12.7% in REM.Stage REM latency was 128.5 minutes ? ?Wake after sleep onset was 4.5. Alpha intrusion was absent. Supine sleep was 51.93%. ? ?CARDIAC DATA ?The 2 lead EKG demonstrated sinus  rhythm. The mean heart rate was 63.0 beats per minute. Other EKG findings include: PVCs. ? ?LEG MOVEMENT DATA ?The total Periodic Limb Movements of Sleep (PLMS) were 0. The PLMS index was 0.0. A PLMS index of <15 is considered normal in adults. ? ?IMPRESSIONS ?- An optimal PAP pressure of 10cm H2O was obtained. ?- Severe oxygen desaturations were observed during this titration (min O2 = 80.0%). ?- No snoring was audible during this study. ?- 2-lead EKG demonstrated: PVCs ?- Clinically significant periodic limb movements were not noted during this study. Arousals associated with PLMs were rare. ? ?DIAGNOSIS ?- Obstructive Sleep Apnea (G47.33) ? ?RECOMMENDATIONS ?- Recommend ResMed auto CPAP from 4 to 15cm H2O with heated humidity and mask of choice.  ?- Avoid alcohol, sedatives and other CNS depressants that may worsen sleep apnea and disrupt normal sleep architecture. ?- Sleep hygiene should be reviewed to assess factors that may improve sleep quality. ?- Weight management and regular exercise should be initiated or continued. ?- Return to Sleep Center for re-evaluation after 6 weeks of therapy ? ?[Electronically signed] 12/16/2021 04:12 PM ? ?Fransico Him MD, ABSM ?Diplomate, Tax adviser of Sleep Medicine ?

## 2021-12-19 ENCOUNTER — Other Ambulatory Visit: Payer: PPO

## 2021-12-19 DIAGNOSIS — E782 Mixed hyperlipidemia: Secondary | ICD-10-CM | POA: Diagnosis not present

## 2021-12-19 DIAGNOSIS — N1832 Chronic kidney disease, stage 3b: Secondary | ICD-10-CM

## 2021-12-19 DIAGNOSIS — R7309 Other abnormal glucose: Secondary | ICD-10-CM | POA: Diagnosis not present

## 2021-12-19 DIAGNOSIS — R739 Hyperglycemia, unspecified: Secondary | ICD-10-CM

## 2021-12-20 ENCOUNTER — Encounter (INDEPENDENT_AMBULATORY_CARE_PROVIDER_SITE_OTHER): Payer: Self-pay | Admitting: Family Medicine

## 2021-12-20 ENCOUNTER — Telehealth (INDEPENDENT_AMBULATORY_CARE_PROVIDER_SITE_OTHER): Payer: PPO | Admitting: Family Medicine

## 2021-12-20 ENCOUNTER — Encounter: Payer: Self-pay | Admitting: Physician Assistant

## 2021-12-20 ENCOUNTER — Ambulatory Visit
Admission: RE | Admit: 2021-12-20 | Discharge: 2021-12-20 | Disposition: A | Discharge status: Home / Self Care | Payer: PPO | Source: Ambulatory Visit

## 2021-12-20 VITALS — Ht 61.0 in | Wt 198.0 lb

## 2021-12-20 DIAGNOSIS — E66813 Obesity, class 3: Secondary | ICD-10-CM

## 2021-12-20 DIAGNOSIS — E781 Pure hyperglyceridemia: Secondary | ICD-10-CM | POA: Diagnosis not present

## 2021-12-20 DIAGNOSIS — R7309 Other abnormal glucose: Secondary | ICD-10-CM

## 2021-12-20 DIAGNOSIS — Z6837 Body mass index (BMI) 37.0-37.9, adult: Secondary | ICD-10-CM

## 2021-12-20 DIAGNOSIS — Z1231 Encounter for screening mammogram for malignant neoplasm of breast: Secondary | ICD-10-CM | POA: Diagnosis not present

## 2021-12-20 DIAGNOSIS — E669 Obesity, unspecified: Secondary | ICD-10-CM

## 2021-12-20 DIAGNOSIS — R739 Hyperglycemia, unspecified: Secondary | ICD-10-CM

## 2021-12-20 LAB — LIPID PANEL
Chol/HDL Ratio: 2.1 ratio (ref 0.0–4.4)
Cholesterol, Total: 118 mg/dL (ref 100–199)
HDL: 55 mg/dL (ref >39)
LDL Chol Calc (NIH): 45 mg/dL (ref 0–99)
Triglycerides: 96 mg/dL (ref 0–149)
VLDL Cholesterol Cal: 18 mg/dL (ref 5–40)

## 2021-12-20 MED ORDER — SEMAGLUTIDE (1 MG/DOSE) 4 MG/3ML ~~LOC~~ SOPN: 1.0000 mg | PEN_INJECTOR | SUBCUTANEOUS | 0 refills | Status: DC

## 2021-12-20 MED ORDER — FENOFIBRATE 48 MG PO TABS: 48.0000 mg | ORAL_TABLET | Freq: Every day | ORAL | 0 refills | Status: DC

## 2021-12-22 ENCOUNTER — Telehealth: Payer: Self-pay | Admitting: *Deleted

## 2021-12-22 NOTE — Telephone Encounter (Signed)
The patient has been notified of the result and verbalized understanding.  All questions (if any) were answered. ?[name], [place] 12/22/2021 4:21 PM   ? ?Upon patient request DME selection is [facility] ?Patient understands he will be contacted by [facility] to set up his cpap. ?Patient understands to call if [facility] does not contact him with new setup in a timely manner. ?Patient understands they will be called once confirmation has been received from [facility]  that they have received their new machine to schedule 10 week follow up appointment. ?  ?[facility] notified of new cpap order  ?Please add to airview ?Patient was grateful for the call and thanked me.   ?

## 2021-12-22 NOTE — Telephone Encounter (Signed)
-----   Message from Lauralee Evener, [place] sent at 12/19/2021  9:02 AM EDT ----- ? ?----- Message ----- ?From: Sueanne Margarita, MD ?Sent: 12/16/2021   4:14 PM EDT ?To: [facility] Div Sleep Studies ? ?Please let patient know that they had a successful PAP titration and let DME know that orders are in EPIC.  Please set up 6 week OV with me.  ?  ? ?

## 2021-12-28 DIAGNOSIS — Z79899 Other long term (current) drug therapy: Secondary | ICD-10-CM | POA: Diagnosis not present

## 2021-12-28 DIAGNOSIS — H5213 Myopia, bilateral: Secondary | ICD-10-CM | POA: Diagnosis not present

## 2021-12-28 DIAGNOSIS — H2513 Age-related nuclear cataract, bilateral: Secondary | ICD-10-CM | POA: Diagnosis not present

## 2021-12-28 NOTE — Progress Notes (Signed)
TeleHealth Visit:  Due to the COVID-19 pandemic, this visit was completed with telemedicine (audio/video) technology to reduce patient and provider exposure as well as to preserve personal protective equipment.   [name] has verbally consented to this TeleHealth visit. The patient is located at  the [facility] office and the provider is located at home. The participants in this visit include the listed provider and patient. The visit was conducted today via Mychart video.  Chief Complaint: OBESITY [name] is here to discuss her progress with her obesity treatment plan along with follow-up of her obesity related diagnoses. [name] is on the Category 3 Plan and states she is following her eating plan approximately 85% of the time. [name] states she is walking in water 60-90 minutes 2-3 times per week.  Today's visit was #: 31 Starting weight: 239 lbs Starting date: 12/01/2019  Interim History: [name] seen virtually today. Her weight on our scale of 198 lbs. She is struggling with motivation daily. She recognizes she has been eating a bit more carbs and drinking access soda. Awaiting a referral to Dr. Debara Pickett. She wants to get back to being motivated.  Subjective:   1. Hypertriglyceridemia [name] is currently taking Tricor. Get control of Triglycerides with addition of Tricor (recent Trigs of 96).  2. Elevated random blood glucose level [name] is doing well on Ozempic with no GI side effects. Her last A1c of 5.4.  Assessment/Plan:   1. Hypertriglyceridemia We will refill Tricor 48 mg by mouth daily for 3 months with zero refills.  -Refill fenofibrate (TRICOR) 48 MG tablet; Take 1 tablet (48 mg total) by mouth daily.  Dispense: 90 tablet; Refill: 0  2. Elevated random blood glucose level We will refill Ozempic 1 mg subcutaneous/weekly for 1 month with zero refills.  -Refill Semaglutide, 1 MG/DOSE, 4 MG/3ML SOPN; Inject 1 mg as directed once a week.  Dispense: 3 mL; Refill: 0  3.  Obesity with current BMI of 37.5 [name] is currently in the action stage of change. As such, her goal is to continue with weight loss efforts. She has agreed to the Category 3 Plan.   Exercise goals: Older adults should follow the adult guidelines. When older adults cannot meet the adult guidelines, they should be as physically active as their abilities and conditions will allow.   Behavioral modification strategies: meal planning and cooking strategies, keeping healthy foods in the home, and planning for success.  [name] has agreed to follow-up with our clinic in 4 weeks. She was informed of the importance of frequent follow-up visits to maximize her success with intensive lifestyle modifications for her multiple health conditions.  Objective:   VITALS: Per patient if applicable, see vitals. GENERAL: Alert and in no acute distress. CARDIOPULMONARY: No increased WOB. Speaking in clear sentences.  PSYCH: Pleasant and cooperative. Speech normal rate and rhythm. Affect is appropriate. Insight and judgement are appropriate. Attention is focused, linear, and appropriate.  NEURO: Oriented as arrived to appointment on time with no prompting.   Lab Results  Component Value Date   CREATININE 1.41 (H) 11/18/2021   BUN 41 (H) 11/18/2021   NA 143 11/18/2021   K 4.3 11/18/2021   CL 100 11/18/2021   CO2 23 11/18/2021   Lab Results  Component Value Date   ALT 13 11/18/2021   AST 24 11/18/2021   ALKPHOS 98 11/18/2021   BILITOT 0.5 11/18/2021   Lab Results  Component Value Date   HGBA1C 5.4 10/11/2021   HGBA1C  5.2 01/31/2021   HGBA1C 5.5 09/23/2020   HGBA1C 5.8 (H) 05/11/2020   HGBA1C 6.2 (H) 12/01/2019   Lab Results  Component Value Date   INSULIN 14.2 01/31/2021   INSULIN 25.4 (H) 09/23/2020   INSULIN 21.0 06/01/2020   INSULIN 32.3 (H) 12/01/2019   Lab Results  Component Value Date   TSH 0.883 11/18/2021   Lab Results  Component Value Date   CHOL 118 12/19/2021   HDL 55 12/19/2021    LDLCALC 45 12/19/2021   TRIG 96 12/19/2021   CHOLHDL 2.1 12/19/2021   Lab Results  Component Value Date   VD25OH 92.2 09/23/2020   VD25OH 62.9 12/01/2019   VD25OH 61.3 08/26/2019   Lab Results  Component Value Date   WBC 7.7 08/31/2021   HGB 13.5 08/31/2021   HCT 40.5 08/31/2021   MCV 85.6 08/31/2021   PLT 193 08/31/2021   No results found for: IRON, TIBC, FERRITIN  Attestation Statements:   Reviewed by clinician on day of visit: allergies, medications, problem list, medical history, surgical history, family history, social history, and previous encounter notes.  I, Elnora Morrison, RMA am acting as transcriptionist for Coralie Common, MD.  I have reviewed the above documentation for accuracy and completeness, and I agree with the above. - Coralie Common, MD

## 2021-12-29 ENCOUNTER — Encounter (INDEPENDENT_AMBULATORY_CARE_PROVIDER_SITE_OTHER): Payer: Self-pay | Admitting: Family Medicine

## 2022-01-10 ENCOUNTER — Other Ambulatory Visit: Payer: Self-pay | Admitting: Physician Assistant

## 2022-01-10 DIAGNOSIS — M3501 Sicca syndrome with keratoconjunctivitis: Secondary | ICD-10-CM

## 2022-01-10 DIAGNOSIS — M0579 Rheumatoid arthritis with rheumatoid factor of multiple sites without organ or systems involvement: Secondary | ICD-10-CM

## 2022-01-10 DIAGNOSIS — Z79899 Other long term (current) drug therapy: Secondary | ICD-10-CM

## 2022-01-11 NOTE — Addendum Note (Signed)
Addended by: Carole Binning on: 01/11/2022 12:25 PM   Modules accepted: Orders

## 2022-01-11 NOTE — Telephone Encounter (Signed)
Next Visit: 01/31/2022   Last Visit: 08/31/2021   Labs: 08/31/2021 Creatinine is better.  CBC is normal.   Eye exam:  02/08/2021     Current Dose per office note 08/31/2021: Arava 20 mg daily and Plaquenil 200 mg 1 tablet by mouth twice daily Monday through Friday.   DB:[id number] arthritis involving multiple sites with positive rheumatoid factor    Last Fill 10/05/2021  Patient advised she is due to update her labs. Patient states she will be coming on Friday to update.   Do you want to update autoimmune labs with standing labs? There are some already pended.    Okay to refill Plaquenil and Arava?

## 2022-01-11 NOTE — Telephone Encounter (Signed)
All autoimmune labs were negative in July 2022.  Please have her come in for CBC with differential, CMP with GFR, sed rate, ANA, dsDNA, Ro, complements and UA.

## 2022-01-12 ENCOUNTER — Encounter (INDEPENDENT_AMBULATORY_CARE_PROVIDER_SITE_OTHER): Payer: Self-pay | Admitting: Family Medicine

## 2022-01-12 ENCOUNTER — Ambulatory Visit (INDEPENDENT_AMBULATORY_CARE_PROVIDER_SITE_OTHER): Payer: PPO | Admitting: Family Medicine

## 2022-01-12 VITALS — BP 108/58 | HR 68 | Temp 98.1°F | Ht 61.0 in | Wt 196.0 lb

## 2022-01-12 DIAGNOSIS — E669 Obesity, unspecified: Secondary | ICD-10-CM | POA: Diagnosis not present

## 2022-01-12 DIAGNOSIS — Z7985 Long-term (current) use of injectable non-insulin antidiabetic drugs: Secondary | ICD-10-CM

## 2022-01-12 DIAGNOSIS — E781 Pure hyperglyceridemia: Secondary | ICD-10-CM | POA: Diagnosis not present

## 2022-01-12 DIAGNOSIS — Z6837 Body mass index (BMI) 37.0-37.9, adult: Secondary | ICD-10-CM

## 2022-01-12 DIAGNOSIS — R7309 Other abnormal glucose: Secondary | ICD-10-CM | POA: Diagnosis not present

## 2022-01-12 MED ORDER — SEMAGLUTIDE (1 MG/DOSE) 4 MG/3ML ~~LOC~~ SOPN: 1.0000 mg | PEN_INJECTOR | SUBCUTANEOUS | 0 refills | Status: DC

## 2022-01-18 NOTE — Progress Notes (Signed)
Chief Complaint:   OBESITY [name] is here to discuss her progress with her obesity treatment plan along with follow-up of her obesity related diagnoses. [name] is on the Category 3 Plan and states she is following her eating plan approximately 80-85% of the time. [name] states she is walking 2 miles 7 times per week.  Today's visit was #: 73 Starting weight: 239 lbs Starting date: 12/01/2019 Today's weight: 196 lbs Today's date: 01/12/2022 Total lbs lost to date: 43 lbs Total lbs lost since last in-office visit: 1  Interim History: [name] went on a trip recently to celebrate her daughters Masters school graduation. Noted she tried to eat a lot of seafood and fish. She did a significant amount of walking. She wants to get back on Cat 3. Going to the beach June 9th with girlfriends.  Subjective:   1. Elevated random blood glucose level [name] is currently on Ozempic 1 mg with some increase in satiety. No GI side effects.  2. Hypertriglyceridemia [name] is currently on Fenofibrate. Her Lenor [name] has significantly improved from 576 to 96.  Assessment/Plan:   1. Elevated random blood glucose level We will refill Ozempic 1 mg subcutaneous once weekly for 1 month with 0 refills.  -Refill Semaglutide, 1 MG/DOSE, 4 MG/3ML SOPN; Inject 1 mg as directed once a week.  Dispense: 3 mL; Refill: 0  2. Hypertriglyceridemia [name] will continue taking Fenofibrate with no changes in dose.  3. Obesity with current BMI of 37.1 [name] is currently in the action stage of change. As such, her goal is to continue with weight loss efforts. She has agreed to the Category 3 Plan.   Exercise goals: All adults should avoid inactivity. Some physical activity is better than none, and adults who participate in any amount of physical activity gain some health benefits.  Behavioral modification strategies: increasing lean protein intake, meal planning and cooking strategies, keeping healthy foods in the home, and planning for  success.  [name] has agreed to follow-up with our clinic in 4 weeks. She was informed of the importance of frequent follow-up visits to maximize her success with intensive lifestyle modifications for her multiple health conditions.   Objective:   Blood pressure (!) 108/58, pulse 68, temperature 98.1 F (36.7 C), height '5\' 1"'$  (1.549 m), weight 196 lb (88.9 kg), SpO2 95 %. Body mass index is 37.03 kg/m.  General: Cooperative, alert, well developed, in no acute distress. HEENT: Conjunctivae and lids unremarkable. Cardiovascular: Regular rhythm.  Lungs: Normal work of breathing. Neurologic: No focal deficits.   Lab Results  Component Value Date   CREATININE 1.41 (H) 11/18/2021   BUN 41 (H) 11/18/2021   NA 143 11/18/2021   K 4.3 11/18/2021   CL 100 11/18/2021   CO2 23 11/18/2021   Lab Results  Component Value Date   ALT 13 11/18/2021   AST 24 11/18/2021   ALKPHOS 98 11/18/2021   BILITOT 0.5 11/18/2021   Lab Results  Component Value Date   HGBA1C 5.4 10/11/2021   HGBA1C 5.2 01/31/2021   HGBA1C 5.5 09/23/2020   HGBA1C 5.8 (H) 05/11/2020   HGBA1C 6.2 (H) 12/01/2019   Lab Results  Component Value Date   INSULIN 14.2 01/31/2021   INSULIN 25.4 (H) 09/23/2020   INSULIN 21.0 06/01/2020   INSULIN 32.3 (H) 12/01/2019   Lab Results  Component Value Date   TSH 0.883 11/18/2021   Lab Results  Component Value Date   CHOL 118 12/19/2021   HDL 55 12/19/2021  LDLCALC 45 12/19/2021   TRIG 96 12/19/2021   CHOLHDL 2.1 12/19/2021   Lab Results  Component Value Date   VD25OH 92.2 09/23/2020   VD25OH 62.9 12/01/2019   VD25OH 61.3 08/26/2019   Lab Results  Component Value Date   WBC 7.7 08/31/2021   HGB 13.5 08/31/2021   HCT 40.5 08/31/2021   MCV 85.6 08/31/2021   PLT 193 08/31/2021   No results found for: IRON, TIBC, FERRITIN  Obesity Behavioral Intervention:   Approximately 15 minutes were spent on the discussion below.  ASK: We discussed the diagnosis of obesity  with [name] today and [name] agreed to give [place] permission to discuss obesity behavioral modification therapy today.  ASSESS: [name] has the diagnosis of obesity and her BMI today is 37.1. [place] is in the action stage of change.   ADVISE: [name] was educated on the multiple health risks of obesity as well as the benefit of weight loss to improve her health. She was advised of the need for long term treatment and the importance of lifestyle modifications to improve her current health and to decrease her risk of future health problems.  AGREE: Multiple dietary modification options and treatment options were discussed and [name] agreed to follow the recommendations documented in the above note.  ARRANGE: [name] was educated on the importance of frequent visits to treat obesity as outlined per CMS and USPSTF guidelines and agreed to schedule her next follow up appointment today.  Attestation Statements:   Reviewed by clinician on day of visit: allergies, medications, problem list, medical history, surgical history, family history, social history, and previous encounter notes.  I, Elnora Morrison, RMA am acting as transcriptionist for Marisa Common, MD.  I have reviewed the above documentation for accuracy and completeness, and I agree with the above. - Marisa Common, MD

## 2022-01-18 NOTE — Progress Notes (Unsigned)
Office Visit Note  Patient: [name]             Date of Birth: 1950/12/11           MRN: [id number]             PCP: Lorrene Reid, PA-C Referring: Lorrene Reid, PA-C Visit Date: 01/31/2022 Occupation: _0 @  Subjective:  Medication monitoring  History of Present Illness: [name] is a 71 y.o. female with history of seropositive rheumatoid arthritis, osteoarthritis, DDD, and fibromyalgia. She is taking Arava 20 mg daily and Plaquenil 200 mg 1 tablet by mouth twice daily Monday through Friday.  She is tolerating both medications without any side effects and has not missed any doses recently.  She denies any recent rheumatoid arthritis flares.  She states that her pain due to fibromyalgia has been tolerable overall.  Her symptoms remain stable.  She continues to have chronic sicca symptoms.  She also has ongoing pain in her feet due to neuropathy.  She has been following up closely with her nephrologist as well as cardiologist's.     Activities of Daily Living:  Patient reports morning stiffness for 10 minutes  Patient Denies nocturnal pain.  Difficulty dressing/grooming: Denies Difficulty climbing stairs: Denies Difficulty getting out of chair: Reports Difficulty using hands for taps, buttons, cutlery, and/or writing: Denies  Review of Systems  Constitutional:  Positive for fatigue.  HENT:  Positive for mouth dryness. Negative for mouth sores and nose dryness.   Eyes:  Positive for dryness. Negative for pain and visual disturbance.  Respiratory:  Negative for cough, hemoptysis, shortness of breath and difficulty breathing.   Cardiovascular:  Positive for palpitations. Negative for chest pain, hypertension and swelling in legs/feet.  Gastrointestinal:  Positive for diarrhea (IBS). Negative for blood in stool and constipation.  Endocrine: Negative for increased urination.  Genitourinary:  Negative for painful urination.  Musculoskeletal:  Positive for joint pain,  joint pain and morning stiffness. Negative for joint swelling, myalgias, muscle weakness, muscle tenderness and myalgias.  Skin:  Negative for color change, pallor, rash, hair loss, nodules/bumps, skin tightness, ulcers and sensitivity to sunlight.  Allergic/Immunologic: Negative for susceptible to infections.  Neurological:  Negative for dizziness, numbness, headaches and weakness.  Hematological:  Negative for swollen glands.  Psychiatric/Behavioral:  Positive for sleep disturbance. Negative for depressed mood. The patient is not nervous/anxious.     PMFS History:  Patient Active Problem List   Diagnosis Date Noted   Other fatigue 02/14/2021   Body mass index (BMI) of 36.0-36.9 in adult 02/14/2021   Prediabetes 02/14/2021   Abnormal kidney function 08/01/2020   Muscle spasm of back 08/01/2020   Microalbuminuria 08/01/2020   Post-menopausal 09/02/2019   Pain in left knee 08/23/2018   Lumbar pain 07/04/2018   Recurrent urticaria 05/20/2018   Rhinitis 05/20/2018   Vitamin D deficiency 11/20/2017   Mixed hyperlipidemia 11/20/2017   Family history of coronary arteriosclerosis- strong fam h/o CAD and early CAD.  07/17/2017   Raynaud's disease without gangrene 03/12/2017   Abnormal weight gain 02/06/2017   Shingles outbreak 02/06/2017   Cyst (solitary) of breast, left 92/06/9416   Eosinophilic esophagitis 40/81/4481   History of hyperlipidemia 10/04/2016   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   History of diverticulitis 09/03/2016   Osteoporosis 09/03/2016   Autoimmune disease ([place]) 09/02/2016   High risk medication use 09/02/2016   History of esophagitis 09/02/2016   Elevated triglycerides with high cholesterol 08/23/2016   Low serum HDL 08/23/2016  Breast cyst, left 06/14/2016   Encounter for wellness examination 05/23/2016   Abnormality of gait 05/09/2016   Sjoegren syndrome 02/29/2016   GERD (gastroesophageal reflux disease) 01/19/2016   Glucose intolerance (impaired  glucose tolerance) 01/19/2016   Chronic diastolic CHF (congestive heart failure) ([place]) 04/20/2015   Peripheral polyneuropathy 02/02/2014   Morbid obesity ([place]) 11/17/2013   Heart murmur    OSA (obstructive sleep apnea)    Rheumatoid arthritis ([place])    Hiatal hernia    Fibromyalgia    PVC's (premature ventricular contractions)    History of total knee replacement, right 06/24/2011   Unilateral primary osteoarthritis, left knee 06/22/2011    Past Medical History:  Diagnosis Date   Arthritis    Rheumatoid arthritis,    Back pain    Blood transfusion    1981   CHF (congestive heart failure) (HCC)    Chronic diastolic CHF (congestive heart failure) (HCC)    diastolic    DDD (degenerative disc disease), cervical    DDD (degenerative disc disease), lumbar    Edema of both lower extremities    Fibromyalgia    Gallbladder problem    GERD (gastroesophageal reflux disease)    Heart murmur    as a child   Hiatal hernia    sjorgens syndrome   High blood pressure    High cholesterol    IBS (irritable bowel syndrome)    Joint pain    Numbness and tingling of both feet    Numbness of fingers    OSA (obstructive sleep apnea)    Osteoarthritis    Peripheral autonomic neuropathy of unknown cause    Pneumonia 07/2018   Polyneuropathy    Pre-diabetes    PVC (premature ventricular contraction)    Raynaud disease    Rheumatoid arthritis ([place])    Sjogren's disease ([place])    Sleep apnea    SOB (shortness of breath)    Swallowing difficulty    Urticaria    Vitamin D deficiency     Family History  Problem Relation Age of Onset   Alzheimer's disease Mother    Heart attack Mother    Hypertension Mother    Glaucoma Mother    High Cholesterol Mother    Heart disease Mother    Depression Mother    Cancer Father    High Cholesterol Father    Heart disease Father    Sleep apnea Father    Breast cancer Maternal Aunt    Heart attack Brother    Heart disease Brother    Glaucoma  Brother    Hyperlipidemia Brother    Glaucoma Brother    Asthma Son    Allergic rhinitis Neg Hx    Angioedema Neg Hx    Eczema Neg Hx    Urticaria Neg Hx    Neuropathy Neg Hx    Past Surgical History:  Procedure Laterality Date   ABDOMINAL HYSTERECTOMY     BTL, BSO   ADENOIDECTOMY     BREAST EXCISIONAL BIOPSY Left    BREAST SURGERY     mass removal    CHOLECYSTECTOMY     dental implants     DIAGNOSTIC LAPAROSCOPY     x3   KNEE ARTHROSCOPY     x2   MASS EXCISION Left 03/22/2017   Procedure: EXCISION OF LEFT BREAST MASS;  Surgeon: Donnie Mesa, MD;  Location: [facility];  Service: General;  Laterality: Left;   patotid cystectomy     RIGHT/LEFT HEART CATH AND CORONARY  ANGIOGRAPHY N/A 07/25/2019   Procedure: RIGHT/LEFT HEART CATH AND CORONARY ANGIOGRAPHY;  Surgeon: Jolaine Artist, MD;  Location: [facility];  Service: Cardiovascular;  Laterality: N/A;   TENDON REPAIR  1980   left ankle and tibia   TONSILLECTOMY     TOTAL KNEE ARTHROPLASTY  06/21/2011   Procedure: TOTAL KNEE ARTHROPLASTY;  Surgeon: Gearlean Alf;  Location: [facility];  Service: Orthopedics;  Laterality: Right;   TUBAL LIGATION     Social History   Social History Narrative   Lives at home alone   Right-handed   Drinks 1 or less cups of coffee and 2 servings of either tea or soda per day   Immunization History  Administered Date(s) Administered   Fluad Quad(high Dose 65+) 05/05/2021   Influenza, High Dose Seasonal PF 06/12/2018, 05/17/2019   Influenza,inj,Quad PF,6+ Mos 05/23/2016, 06/01/2017   Influenza-Unspecified 06/05/2018, 05/17/2019, 06/07/2020   PFIZER(Purple Top)SARS-COV-2 Vaccination 09/03/2019, 09/24/2019, 04/01/2020, 10/04/2020   Pfizer Covid-19 Vaccine Bivalent Booster 53yr & up 05/05/2021   Pneumococcal Conjugate-13 07/02/2017   Pneumococcal Polysaccharide-23 05/17/2005   Td 10/16/2007   Tdap 03/21/2018   Zoster Recombinat (Shingrix) 06/10/2019, 12/11/2019      Objective: Vital Signs: BP 106/71 (BP Location: Left Arm, Patient Position: Sitting, Cuff Size: Small)   Pulse 71   Resp 12   Ht 5' 1.5" (1.562 m)   Wt 201 lb (91.2 kg)   BMI 37.36 kg/m    Physical Exam Vitals and nursing note reviewed.  Constitutional:      Appearance: She is well-developed.  HENT:     Head: Normocephalic and atraumatic.  Eyes:     Conjunctiva/sclera: Conjunctivae normal.  Cardiovascular:     Rate and Rhythm: Normal rate and regular rhythm.     Heart sounds: Normal heart sounds.  Pulmonary:     Effort: Pulmonary effort is normal.     Breath sounds: Normal breath sounds.  Abdominal:     General: Bowel sounds are normal.     Palpations: Abdomen is soft.  Musculoskeletal:     Cervical back: Normal range of motion.  Skin:    General: Skin is warm and dry.     Capillary Refill: Capillary refill takes less than 2 seconds.  Neurological:     Mental Status: She is alert and oriented to person, place, and time.  Psychiatric:        Behavior: Behavior normal.      Musculoskeletal Exam:   CDAI Exam: CDAI Score: -- Patient Global: --; Provider Global: -- Swollen: --; Tender: -- Joint Exam 01/31/2022   No joint exam has been documented for this visit   There is currently no information documented on the homunculus. Go to the Rheumatology activity and complete the homunculus joint exam.  Investigation: No additional findings.  Imaging: No results found.  Recent Labs: Lab Results  Component Value Date   WBC 5.3 01/19/2022   HGB 13.6 01/19/2022   PLT 201 01/19/2022   NA 142 01/19/2022   K 4.0 01/19/2022   CL 101 01/19/2022   CO2 25 01/19/2022   GLUCOSE 87 01/19/2022   BUN 34 (H) 01/19/2022   CREATININE 1.96 (H) 01/19/2022   BILITOT 0.4 01/19/2022   ALKPHOS 98 11/18/2021   AST 25 01/19/2022   ALT 21 01/19/2022   PROT 6.8 01/19/2022   ALBUMIN 4.5 11/18/2021   CALCIUM 9.3 01/19/2022   GFRAA 58 (L) 09/23/2020    Speciality Comments: PLQ  Eye Exam: 02/08/2021 [facility]  follow up in 1 year   Procedures:  No procedures performed Allergies: Sulfa antibiotics, Cymbalta [duloxetine hcl], Demerol, Ivp dye [iodinated contrast media], Morphine and related, Sulfasalazine, and Adhesive [tape]      Assessment / Plan:     Visit Diagnoses: Rheumatoid arthritis involving multiple sites with positive rheumatoid factor (HCC) - Positive RF, positive ANA: She has no synovitis on examination today.  She has not had any signs or symptoms of a rheumatoid arthritis flare.  She has occasional discomfort and stiffness in both hands due to underlying osteoarthritis.  Overall her rheumatoid arthritis has been well controlled taking Arava 20 mg 1 tablet by mouth daily and Plaquenil 200 mg 1 tablet by mouth twice daily Monday through Friday.  She has been tolerating both medications without any side effects.  Discussed that the current recommendation is for her to reduce the dose of Plaquenil to 200 mg 1 tablet daily due to chronic kidney disease (GFR 27 on 01/19/22).  Patient continues to follow-up closely with her nephrologist.  A new prescription for Plaquenil 200 mg 1 tablet by mouth daily will be sent to the pharmacy today along with her refill for arava.  She was advised to notify us if she develops increased joint pain or joint swelling on the reduced dose of Plaquenil.  She will follow-up in the office in 5 months or sooner if needed.- Plan: hydroxychloroquine (PLAQUENIL) 200 MG tablet  High risk medication use - Arava 20 mg 1 tablet by mouth daily and Plaquenil 200 mg 1 tablet by mouth by mouth daily.  Advised to reduce the dose of plaquenil due to CKD.  A new prescription for Plaquenil was sent to the pharmacy today.  D/c IV orencia due to cost.  CBC and CMP updated on 01/19/22.  She will be due to update lab work in September and every 3 months.   Discussed the importance of holding arava if she develops signs or symptoms of an infection and  to resume once the infection has completely cleared.  PLQ Eye Exam: 02/08/2021 [facility] follow up in 1 year.  Due to update plaquenil eye exam.  Sicca syndrome ([place]) - ANA-, Ro-, and La-: She continues to have ongoing sicca symptoms. Lab work from 01/19/22 was reviewed today in the office: ANA negative, dsDNA negative, ESR WNL, complements WNL, and Ro antibody negative.  Patient remains concerned about the possible diagnosis of Sjogren's syndrome.  Discussed that in order to confirm the diagnosis a lip biopsy would need to be performed.  She does not want to proceed with any further testing at this time.  Raynaud's disease without gangrene: Not currently active.  No digital ulcerations or signs of sclerodactyly noted.  Unilateral primary osteoarthritis, left knee: Good range of motion with some discomfort and crepitus.  No warmth or effusion noted.  History of total knee replacement, right: Doing well.  Good range of motion with no discomfort at this time.  Chronic SI joint pain  DDD (degenerative disc disease), lumbar: Chronic pain.  Fibromyalgia: She experiences intermittent myalgias and muscle tenderness due to fibromyalgia.  Overall her symptoms have been tolerable.  Discussed the importance of regular exercise and good sleep hygiene.  Age-related osteoporosis without current pathological fracture: DEXA 08/27/2019: The BMD measured at Femur Total Right is 0.782 g/cm2 with a T-score of -1.8. s/p Forteo.   DEXA scheduled on 04/24/22.   CKD (chronic kidney disease) stage 4, GFR 15-29 ml/min ([place]): Discussed reducing the dose of Plaquenil from  200 mg 1 tablet twice daily Monday through Friday to 200 mg 1 tablet daily. A new prescription for the reduced dose of plaquenil was sent to the pharmacy.  Discussed the importance of avoiding NSAID use.  Other medical conditions are listed as follows:  History of hyperlipidemia  History of cardiac murmur  History of diabetes  mellitus  History of peripheral neuropathy  History of diverticulitis  Eosinophilic esophagitis  History of depression    Orders: No orders of the defined types were placed in this encounter.  Meds ordered this encounter  Medications   leflunomide (ARAVA) 20 MG tablet    Sig: Take 1 tablet (20 mg total) by mouth daily.    Dispense:  90 tablet    Refill:  0   DISCONTD: hydroxychloroquine (PLAQUENIL) 200 MG tablet    Sig: Take 1 tablet by mouth twice Monday through Friday only.    Dispense:  120 tablet    Refill:  0   hydroxychloroquine (PLAQUENIL) 200 MG tablet    Sig: Take 1 tablet (200 mg total) by mouth daily.    Dispense:  90 tablet    Refill:  0      Follow-Up Instructions: Return in about 5 months (around 07/03/2022) for Rheumatoid arthritis, Osteoarthritis, Osteoporosis, DDD, Fibromyalgia.   Ofilia Neas, PA-C  Note - This record has been created using Dragon software.  Chart creation errors have been sought, but may not always  have been located. Such creation errors do not reflect on  the standard of medical care.

## 2022-01-19 ENCOUNTER — Other Ambulatory Visit: Payer: Self-pay | Admitting: *Deleted

## 2022-01-19 DIAGNOSIS — M3501 Sicca syndrome with keratoconjunctivitis: Secondary | ICD-10-CM

## 2022-01-19 DIAGNOSIS — Z79899 Other long term (current) drug therapy: Secondary | ICD-10-CM

## 2022-01-19 DIAGNOSIS — M0579 Rheumatoid arthritis with rheumatoid factor of multiple sites without organ or systems involvement: Secondary | ICD-10-CM

## 2022-01-20 NOTE — Progress Notes (Signed)
Drop in GFR noted.  Most likely due to diuretic use.  Please forward results to her PCP and nephrologist.  Most of her other labs are pending.

## 2022-01-21 LAB — CBC WITH DIFFERENTIAL/PLATELET
Absolute Monocytes: 477 cells/uL (ref 200–950)
Basophils Absolute: 48 cells/uL (ref 0–200)
Basophils Relative: 0.9 %
Eosinophils Absolute: 80 cells/uL (ref 15–500)
Eosinophils Relative: 1.5 %
HCT: 41.1 % (ref 35.0–45.0)
Hemoglobin: 13.6 g/dL (ref 11.7–15.5)
Lymphs Abs: 837 cells/uL — ABNORMAL LOW (ref 850–3900)
MCH: 28.6 pg (ref 27.0–33.0)
MCHC: 33.1 g/dL (ref 32.0–36.0)
MCV: 86.5 fL (ref 80.0–100.0)
MPV: 11.8 fL (ref 7.5–12.5)
Monocytes Relative: 9 %
Neutro Abs: 3858 cells/uL (ref 1500–7800)
Neutrophils Relative %: 72.8 %
Platelets: 201 10*3/uL (ref 140–400)
RBC: 4.75 10*6/uL (ref 3.80–5.10)
RDW: 13.1 % (ref 11.0–15.0)
Total Lymphocyte: 15.8 %
WBC: 5.3 10*3/uL (ref 3.8–10.8)

## 2022-01-21 LAB — COMPLETE METABOLIC PANEL WITH GFR
AG Ratio: 1.6 (calc) (ref 1.0–2.5)
ALT: 21 U/L (ref 6–29)
AST: 25 U/L (ref 10–35)
Albumin: 4.2 g/dL (ref 3.6–5.1)
Alkaline phosphatase (APISO): 71 U/L (ref 37–153)
BUN/Creatinine Ratio: 17 (calc) (ref 6–22)
BUN: 34 mg/dL — ABNORMAL HIGH (ref 7–25)
CO2: 25 mmol/L (ref 20–32)
Calcium: 9.3 mg/dL (ref 8.6–10.4)
Chloride: 101 mmol/L (ref 98–110)
Creat: 1.96 mg/dL — ABNORMAL HIGH (ref 0.50–1.05)
Globulin: 2.6 g/dL (calc) (ref 1.9–3.7)
Glucose, Bld: 87 mg/dL (ref 65–99)
Potassium: 4 mmol/L (ref 3.5–5.3)
Sodium: 142 mmol/L (ref 135–146)
Total Bilirubin: 0.4 mg/dL (ref 0.2–1.2)
Total Protein: 6.8 g/dL (ref 6.1–8.1)
eGFR: 27 mL/min/{1.73_m2} — ABNORMAL LOW (ref >=60)

## 2022-01-21 LAB — URINALYSIS, ROUTINE W REFLEX MICROSCOPIC
Bacteria, UA: NONE SEEN /HPF
Bilirubin Urine: NEGATIVE
Glucose, UA: NEGATIVE
Hgb urine dipstick: NEGATIVE
Ketones, ur: NEGATIVE
Nitrite: NEGATIVE
Protein, ur: NEGATIVE
RBC / HPF: NONE SEEN /HPF (ref 0–2)
Specific Gravity, Urine: 1.008 (ref 1.001–1.035)
pH: <=5 (ref 5.0–8.0)

## 2022-01-21 LAB — SJOGRENS SYNDROME-A EXTRACTABLE NUCLEAR ANTIBODY: SSA (Ro) (ENA) Antibody, IgG: <1 AI

## 2022-01-21 LAB — ANTI-DNA ANTIBODY, DOUBLE-STRANDED: ds DNA Ab: <1 IU/mL

## 2022-01-21 LAB — SEDIMENTATION RATE: Sed Rate: 22 mm/h (ref 0–30)

## 2022-01-21 LAB — ANA: Anti Nuclear Antibody (ANA): NEGATIVE

## 2022-01-21 LAB — MICROSCOPIC MESSAGE

## 2022-01-21 LAB — C3 AND C4
C3 Complement: 171 mg/dL (ref 83–193)
C4 Complement: 26 mg/dL (ref 15–57)

## 2022-01-21 NOTE — Progress Notes (Signed)
UA negative, CBC normal except low lymphocyte due to immunosuppression, ANA negative, Ro antibody negative, double-stranded DNA negative, complements normal, sed rate normal.  Labs do not indicate a disease flare.  All the labs for Sjogren's are negative.

## 2022-01-31 ENCOUNTER — Telehealth: Payer: Self-pay | Admitting: Rheumatology

## 2022-01-31 ENCOUNTER — Encounter: Payer: Self-pay | Admitting: Physician Assistant

## 2022-01-31 ENCOUNTER — Ambulatory Visit (INDEPENDENT_AMBULATORY_CARE_PROVIDER_SITE_OTHER): Payer: PPO | Admitting: Physician Assistant

## 2022-01-31 VITALS — BP 106/71 | HR 71 | Resp 12 | Ht 61.5 in | Wt 201.0 lb

## 2022-01-31 DIAGNOSIS — M533 Sacrococcygeal disorders, not elsewhere classified: Secondary | ICD-10-CM | POA: Diagnosis not present

## 2022-01-31 DIAGNOSIS — M5136 Other intervertebral disc degeneration, lumbar region: Secondary | ICD-10-CM | POA: Diagnosis not present

## 2022-01-31 DIAGNOSIS — M3501 Sicca syndrome with keratoconjunctivitis: Secondary | ICD-10-CM

## 2022-01-31 DIAGNOSIS — I7300 Raynaud's syndrome without gangrene: Secondary | ICD-10-CM | POA: Diagnosis not present

## 2022-01-31 DIAGNOSIS — M0579 Rheumatoid arthritis with rheumatoid factor of multiple sites without organ or systems involvement: Secondary | ICD-10-CM

## 2022-01-31 DIAGNOSIS — M3500 Sicca syndrome, unspecified: Secondary | ICD-10-CM

## 2022-01-31 DIAGNOSIS — K200 Eosinophilic esophagitis: Secondary | ICD-10-CM

## 2022-01-31 DIAGNOSIS — Z8679 Personal history of other diseases of the circulatory system: Secondary | ICD-10-CM | POA: Diagnosis not present

## 2022-01-31 DIAGNOSIS — M1712 Unilateral primary osteoarthritis, left knee: Secondary | ICD-10-CM

## 2022-01-31 DIAGNOSIS — Z8659 Personal history of other mental and behavioral disorders: Secondary | ICD-10-CM

## 2022-01-31 DIAGNOSIS — M797 Fibromyalgia: Secondary | ICD-10-CM

## 2022-01-31 DIAGNOSIS — Z8639 Personal history of other endocrine, nutritional and metabolic disease: Secondary | ICD-10-CM

## 2022-01-31 DIAGNOSIS — M810 Age-related osteoporosis without current pathological fracture: Secondary | ICD-10-CM | POA: Diagnosis not present

## 2022-01-31 DIAGNOSIS — Z79899 Other long term (current) drug therapy: Secondary | ICD-10-CM | POA: Diagnosis not present

## 2022-01-31 DIAGNOSIS — Z96651 Presence of right artificial knee joint: Secondary | ICD-10-CM

## 2022-01-31 DIAGNOSIS — Z8719 Personal history of other diseases of the digestive system: Secondary | ICD-10-CM

## 2022-01-31 DIAGNOSIS — N184 Chronic kidney disease, stage 4 (severe): Secondary | ICD-10-CM

## 2022-01-31 DIAGNOSIS — Z8669 Personal history of other diseases of the nervous system and sense organs: Secondary | ICD-10-CM

## 2022-01-31 DIAGNOSIS — G8929 Other chronic pain: Secondary | ICD-10-CM

## 2022-01-31 MED ORDER — HYDROXYCHLOROQUINE SULFATE 200 MG PO TABS: 200.0000 mg | ORAL_TABLET | Freq: Every day | ORAL | 0 refills | Status: DC

## 2022-01-31 MED ORDER — LEFLUNOMIDE 20 MG PO TABS: 20.0000 mg | ORAL_TABLET | Freq: Every day | ORAL | 0 refills | Status: DC

## 2022-01-31 MED ORDER — HYDROXYCHLOROQUINE SULFATE 200 MG PO TABS: ORAL_TABLET | ORAL | 0 refills | Status: DC

## 2022-01-31 NOTE — Patient Instructions (Signed)
Standing Labs We placed an order today for your standing lab work.   Please have your standing labs drawn in September and every 3 months   If possible, please have your labs drawn 2 weeks prior to your appointment so that the provider can discuss your results at your appointment.  Please note that you may see your imaging and lab results in MyChart before we have reviewed them. We may be awaiting multiple results to interpret others before contacting you. Please allow our office up to 72 hours to thoroughly review all of the results before contacting the office for clarification of your results.  We have open lab daily: Monday through Thursday from 1:30-4:30 PM and Friday from 1:30-4:00 PM at the office of Dr. Shaili Deveshwar, [facility].   Please be advised, all patients with office appointments requiring lab work will take precedent over walk-in lab work.  If possible, please come for your lab work on Monday and Friday afternoons, as you may experience shorter wait times. The office is located at [address], [address], [place], [place] [address] No appointment is necessary.   Labs are drawn by [facility]. Please bring your co-pay at the time of your lab draw.  You may receive a bill from [facility] for your lab work.  Please note if you are on Hydroxychloroquine and and an order has been placed for a Hydroxychloroquine level, you will need to have it drawn 4 hours or more after your last dose.  If you wish to have your labs drawn at another location, please call the office 24 hours in advance to send orders.  If you have any questions regarding directions or hours of operation,  please call [phone].   As a reminder, please drink plenty of water prior to coming for your lab work. Thanks!   If you have signs or symptoms of an infection or start antibiotics: First, call your PCP for workup of your infection. Hold your medication through the infection, until you complete  your antibiotics, and until symptoms resolve if you take the following: Injectable medication (Actemra, Benlysta, Cimzia, Cosentyx, Enbrel, Humira, Kevzara, Orencia, Remicade, Simponi, Stelara, Taltz, Tremfya) Methotrexate Leflunomide (Arava) Mycophenolate (Cellcept) Xeljanz, Olumiant, or Rinvoq  

## 2022-01-31 NOTE — Telephone Encounter (Signed)
I called patient to discuss reduced renal function and the reason we are reducing the dose of hydroxychloroquine.  She voiced understanding.  I also discussed the possible decrease in the dose of leflunomide.  She is hesitant to reduce the dose of leflunomide as she is concerned about a flare of rheumatoid arthritis.  I advised her to discuss the dose of leflunomide with her nephrologist.  She was in agreement. Bo Merino, MD

## 2022-02-01 ENCOUNTER — Encounter: Payer: Self-pay | Admitting: Cardiology

## 2022-02-02 NOTE — Telephone Encounter (Signed)
Patient's dme is [facility].

## 2022-02-07 ENCOUNTER — Encounter (INDEPENDENT_AMBULATORY_CARE_PROVIDER_SITE_OTHER): Payer: Self-pay | Admitting: Family Medicine

## 2022-02-07 ENCOUNTER — Ambulatory Visit (INDEPENDENT_AMBULATORY_CARE_PROVIDER_SITE_OTHER): Payer: PPO | Admitting: Family Medicine

## 2022-02-07 VITALS — BP 130/79 | HR 67 | Temp 98.5°F | Ht 61.0 in | Wt 195.0 lb

## 2022-02-07 DIAGNOSIS — J3089 Other allergic rhinitis: Secondary | ICD-10-CM

## 2022-02-07 DIAGNOSIS — R7309 Other abnormal glucose: Secondary | ICD-10-CM | POA: Diagnosis not present

## 2022-02-07 DIAGNOSIS — R0602 Shortness of breath: Secondary | ICD-10-CM | POA: Diagnosis not present

## 2022-02-07 DIAGNOSIS — E669 Obesity, unspecified: Secondary | ICD-10-CM

## 2022-02-07 DIAGNOSIS — Z6836 Body mass index (BMI) 36.0-36.9, adult: Secondary | ICD-10-CM | POA: Diagnosis not present

## 2022-02-07 MED ORDER — CETIRIZINE HCL 10 MG PO TABS: 10.0000 mg | ORAL_TABLET | Freq: Every day | ORAL | 0 refills | Status: DC

## 2022-02-07 MED ORDER — SEMAGLUTIDE (1 MG/DOSE) 4 MG/3ML ~~LOC~~ SOPN: 1.0000 mg | PEN_INJECTOR | SUBCUTANEOUS | 0 refills | Status: DC

## 2022-02-08 NOTE — Progress Notes (Signed)
Chief Complaint:   OBESITY [name] is here to discuss her progress with her obesity treatment plan along with follow-up of her obesity related diagnoses. [name] is on the Category 3 Plan and states she is following her eating plan approximately 80% of the time. [name] states she is walking/pool 90 minutes 3 times per week.  Today's visit was #: 64 Starting weight: 239 lbs Starting date: 12/01/2019 Today's weight: 195 lbs Today's date: 02/07/2022 Total lbs lost to date: 44 lbs Total lbs lost since last in-office visit: 1  Interim History: [name] went to the beach while away and recognized she stuck to plan when she was able. Since coming home she has tried to get back to meal plan. !st week in August she is going back to the beach.  Subjective:   1. SOBOE (shortness of breath on exertion) Kerah's symptoms slightly improved from previously. Her original IC was 12.  2. Allergic rhinitis due to other allergic trigger, unspecified seasonality [name] is currently taking Zyrtec and Flonase. Symptoms well controlled today.  3. Elevated random blood glucose level [name] is currently on Ozempic 1 mg. Since medication initiated blood sugars are better controlled.  Assessment/Plan:   1. SOBOE (shortness of breath on exertion) IC today was 1670.  2. Allergic rhinitis due to other allergic trigger, unspecified seasonality We will refill Zyrtec 10 mg by mouth daily for 3 months with 0 refills.  -Refill cetirizine (ZYRTEC) 10 MG tablet; Take 1 tablet (10 mg total) by mouth daily.  Dispense: 90 tablet; Refill: 0  3. Elevated random blood glucose level We will refill Ozempic 1 mg SubQ once weekly for 1 month with 0 refills.  -Refill Semaglutide, 1 MG/DOSE, 4 MG/3ML SOPN; Inject 1 mg as directed once a week.  Dispense: 3 mL; Refill: 0  4. Obesity with current BMI of 36.8 [name] is currently in the action stage of change. As such, her goal is to continue with weight loss efforts. She has agreed to the  Category 2 Plan with 8oz +100.   Exercise goals: All adults should avoid inactivity. Some physical activity is better than none, and adults who participate in any amount of physical activity gain some health benefits.  Behavioral modification strategies: increasing lean protein intake, meal planning and cooking strategies, and keeping healthy foods in the home.  [name] has agreed to follow-up with our clinic in 5 weeks. She was informed of the importance of frequent follow-up visits to maximize her success with intensive lifestyle modifications for her multiple health conditions.   Objective:   Blood pressure 130/79, pulse 67, temperature 98.5 F (36.9 C), height '5\' 1"'$  (1.549 m), weight 195 lb (88.5 kg), SpO2 97 %. Body mass index is 36.84 kg/m.  General: Cooperative, alert, well developed, in no acute distress. HEENT: Conjunctivae and lids unremarkable. Cardiovascular: Regular rhythm.  Lungs: Normal work of breathing. Neurologic: No focal deficits.   Lab Results  Component Value Date   CREATININE 1.96 (H) 01/19/2022   BUN 34 (H) 01/19/2022   NA 142 01/19/2022   K 4.0 01/19/2022   CL 101 01/19/2022   CO2 25 01/19/2022   Lab Results  Component Value Date   ALT 21 01/19/2022   AST 25 01/19/2022   ALKPHOS 98 11/18/2021   BILITOT 0.4 01/19/2022   Lab Results  Component Value Date   HGBA1C 5.4 10/11/2021   HGBA1C 5.2 01/31/2021   HGBA1C 5.5 09/23/2020   HGBA1C 5.8 (H) 05/11/2020   HGBA1C 6.2 (H) 12/01/2019  Lab Results  Component Value Date   INSULIN 14.2 01/31/2021   INSULIN 25.4 (H) 09/23/2020   INSULIN 21.0 06/01/2020   INSULIN 32.3 (H) 12/01/2019   Lab Results  Component Value Date   TSH 0.883 11/18/2021   Lab Results  Component Value Date   CHOL 118 12/19/2021   HDL 55 12/19/2021   LDLCALC 45 12/19/2021   TRIG 96 12/19/2021   CHOLHDL 2.1 12/19/2021   Lab Results  Component Value Date   VD25OH 92.2 09/23/2020   VD25OH 62.9 12/01/2019   VD25OH 61.3  08/26/2019   Lab Results  Component Value Date   WBC 5.3 01/19/2022   HGB 13.6 01/19/2022   HCT 41.1 01/19/2022   MCV 86.5 01/19/2022   PLT 201 01/19/2022   No results found for: "IRON", "TIBC", "FERRITIN"  Obesity Behavioral Intervention:   Approximately 15 minutes were spent on the discussion below.  ASK: We discussed the diagnosis of obesity with [name] today and [name] agreed to give [place] permission to discuss obesity behavioral modification therapy today.  ASSESS: [name] has the diagnosis of obesity and her BMI today is 36.8. [name] is in the action stage of change.   ADVISE: [name] was educated on the multiple health risks of obesity as well as the benefit of weight loss to improve her health. She was advised of the need for long term treatment and the importance of lifestyle modifications to improve her current health and to decrease her risk of future health problems.  AGREE: Multiple dietary modification options and treatment options were discussed and [name] agreed to follow the recommendations documented in the above note.  ARRANGE: [name] was educated on the importance of frequent visits to treat obesity as outlined per CMS and USPSTF guidelines and agreed to schedule her next follow up appointment today.  Attestation Statements:   Reviewed by clinician on day of visit: allergies, medications, problem list, medical history, surgical history, family history, social history, and previous encounter notes.  I, Elnora Morrison, RMA am acting as transcriptionist for Coralie Common, MD.  I have reviewed the above documentation for accuracy and completeness, and I agree with the above. - Coralie Common, MD

## 2022-02-24 DIAGNOSIS — G4733 Obstructive sleep apnea (adult) (pediatric): Secondary | ICD-10-CM | POA: Diagnosis not present

## 2022-02-24 DIAGNOSIS — R269 Unspecified abnormalities of gait and mobility: Secondary | ICD-10-CM | POA: Diagnosis not present

## 2022-03-01 ENCOUNTER — Other Ambulatory Visit: Payer: Self-pay | Admitting: Rheumatology

## 2022-03-01 NOTE — Telephone Encounter (Signed)
Next Visit: 07/25/2022  Last Visit: 01/31/2022  Last Fill: 09/14/2021   Okay to refill Promethazine?

## 2022-03-02 DIAGNOSIS — G4733 Obstructive sleep apnea (adult) (pediatric): Secondary | ICD-10-CM | POA: Diagnosis not present

## 2022-03-06 ENCOUNTER — Ambulatory Visit (INDEPENDENT_AMBULATORY_CARE_PROVIDER_SITE_OTHER): Payer: PPO | Admitting: Family Medicine

## 2022-03-06 ENCOUNTER — Encounter (INDEPENDENT_AMBULATORY_CARE_PROVIDER_SITE_OTHER): Payer: Self-pay | Admitting: Family Medicine

## 2022-03-06 VITALS — BP 102/71 | HR 72 | Temp 98.2°F | Ht 61.0 in | Wt 201.0 lb

## 2022-03-06 DIAGNOSIS — R7309 Other abnormal glucose: Secondary | ICD-10-CM

## 2022-03-06 DIAGNOSIS — E7849 Other hyperlipidemia: Secondary | ICD-10-CM

## 2022-03-06 DIAGNOSIS — E669 Obesity, unspecified: Secondary | ICD-10-CM

## 2022-03-06 DIAGNOSIS — Z6838 Body mass index (BMI) 38.0-38.9, adult: Secondary | ICD-10-CM

## 2022-03-06 MED ORDER — SEMAGLUTIDE (1 MG/DOSE) 4 MG/3ML ~~LOC~~ SOPN: 1.0000 mg | PEN_INJECTOR | SUBCUTANEOUS | 0 refills | Status: DC

## 2022-03-06 MED ORDER — FENOFIBRATE 48 MG PO TABS: 48.0000 mg | ORAL_TABLET | Freq: Every day | ORAL | 0 refills | Status: DC

## 2022-03-07 DIAGNOSIS — K2270 Barrett's esophagus without dysplasia: Secondary | ICD-10-CM | POA: Diagnosis not present

## 2022-03-07 DIAGNOSIS — K580 Irritable bowel syndrome with diarrhea: Secondary | ICD-10-CM | POA: Diagnosis not present

## 2022-03-07 DIAGNOSIS — K219 Gastro-esophageal reflux disease without esophagitis: Secondary | ICD-10-CM | POA: Diagnosis not present

## 2022-03-07 DIAGNOSIS — K9089 Other intestinal malabsorption: Secondary | ICD-10-CM | POA: Diagnosis not present

## 2022-03-07 DIAGNOSIS — K449 Diaphragmatic hernia without obstruction or gangrene: Secondary | ICD-10-CM | POA: Diagnosis not present

## 2022-03-08 NOTE — Progress Notes (Signed)
Chief Complaint:   OBESITY [name] is here to discuss her progress with her obesity treatment plan along with follow-up of her obesity related diagnoses. [name] is on the Category 2 Plan and states she is following her eating plan approximately 90% of the time. [name] states she is walking 60-90 minutes 2-7 times per week.  Today's visit was #: 46 Starting weight: 239 lbs Starting date: 12/01/2019 Today's weight: 201 lbs Today's date: 03/06/2022 Total lbs lost to date: 38 lbs Total lbs lost since last in-office visit: 0  Interim History: [name] has been more active recently. She is trying to get all food in on plan. She has been hungrier but has not given in to her hunger. She has a beach trip scheduled Aug 5 for a week with her family.   Subjective:   1. Other hyperlipidemia [name] is currently on Tricor and Crestor with no myalgias or transaminitis.   2. Elevated random blood glucose level [name] is currently on Ozempic with good blood sugar control, last A1c 5.4.  Assessment/Plan:   1. Other hyperlipidemia We will refill Tricor 48 mg by mouth daily for 3 months with 0 refills.   -Refill fenofibrate (TRICOR) 48 MG tablet; Take 1 tablet (48 mg total) by mouth daily.  Dispense: 90 tablet; Refill: 0  2. Elevated random blood glucose level We will refill Ozempic 1 mg SubQ once weekly for 1 month with 0 refills.  -Refill Semaglutide, 1 MG/DOSE, 4 MG/3ML SOPN; Inject 1 mg as directed once a week.  Dispense: 3 mL; Refill: 0  3. Obesity with current BMI of 38.0 [name] is currently in the action stage of change. As such, her goal is to continue with weight loss efforts. She has agreed to the Category 2 Plan.   Exercise goals: All adults should avoid inactivity. Some physical activity is better than none, and adults who participate in any amount of physical activity gain some health benefits.  Behavioral modification strategies: increasing lean protein intake, meal planning and cooking  strategies, keeping healthy foods in the home, and planning for success.  [name] has agreed to follow-up with our clinic in 4 weeks. She was informed of the importance of frequent follow-up visits to maximize her success with intensive lifestyle modifications for her multiple health conditions.   Objective:   Blood pressure 102/71, pulse 72, temperature 98.2 F (36.8 C), height '5\' 1"'$  (1.549 m), weight 201 lb (91.2 kg), SpO2 100 %. Body mass index is 37.98 kg/m.  General: Cooperative, alert, well developed, in no acute distress. HEENT: Conjunctivae and lids unremarkable. Cardiovascular: Regular rhythm.  Lungs: Normal work of breathing. Neurologic: No focal deficits.   Lab Results  Component Value Date   CREATININE 1.96 (H) 01/19/2022   BUN 34 (H) 01/19/2022   NA 142 01/19/2022   K 4.0 01/19/2022   CL 101 01/19/2022   CO2 25 01/19/2022   Lab Results  Component Value Date   ALT 21 01/19/2022   AST 25 01/19/2022   ALKPHOS 98 11/18/2021   BILITOT 0.4 01/19/2022   Lab Results  Component Value Date   HGBA1C 5.4 10/11/2021   HGBA1C 5.2 01/31/2021   HGBA1C 5.5 09/23/2020   HGBA1C 5.8 (H) 05/11/2020   HGBA1C 6.2 (H) 12/01/2019   Lab Results  Component Value Date   INSULIN 14.2 01/31/2021   INSULIN 25.4 (H) 09/23/2020   INSULIN 21.0 06/01/2020   INSULIN 32.3 (H) 12/01/2019   Lab Results  Component Value Date   TSH  0.883 11/18/2021   Lab Results  Component Value Date   CHOL 118 12/19/2021   HDL 55 12/19/2021   LDLCALC 45 12/19/2021   TRIG 96 12/19/2021   CHOLHDL 2.1 12/19/2021   Lab Results  Component Value Date   VD25OH 92.2 09/23/2020   VD25OH 62.9 12/01/2019   VD25OH 61.3 08/26/2019   Lab Results  Component Value Date   WBC 5.3 01/19/2022   HGB 13.6 01/19/2022   HCT 41.1 01/19/2022   MCV 86.5 01/19/2022   PLT 201 01/19/2022   No results found for: "IRON", "TIBC", "FERRITIN"  Attestation Statements:   Reviewed by clinician on day of visit: allergies,  medications, problem list, medical history, surgical history, family history, social history, and previous encounter notes.  I, Elnora Morrison, RMA am acting as transcriptionist for Coralie Common, MD.  I have reviewed the above documentation for accuracy and completeness, and I agree with the above. - Coralie Common, MD

## 2022-03-22 ENCOUNTER — Encounter (INDEPENDENT_AMBULATORY_CARE_PROVIDER_SITE_OTHER): Payer: Self-pay

## 2022-03-27 ENCOUNTER — Encounter: Payer: Self-pay | Admitting: Physician Assistant

## 2022-03-27 ENCOUNTER — Ambulatory Visit (INDEPENDENT_AMBULATORY_CARE_PROVIDER_SITE_OTHER): Payer: PPO | Admitting: Physician Assistant

## 2022-03-27 VITALS — BP 121/71 | HR 67 | Temp 97.7°F | Ht 62.5 in | Wt 203.0 lb

## 2022-03-27 DIAGNOSIS — N1832 Chronic kidney disease, stage 3b: Secondary | ICD-10-CM

## 2022-03-27 DIAGNOSIS — E782 Mixed hyperlipidemia: Secondary | ICD-10-CM

## 2022-03-27 DIAGNOSIS — M545 Low back pain, unspecified: Secondary | ICD-10-CM | POA: Diagnosis not present

## 2022-03-27 DIAGNOSIS — G629 Polyneuropathy, unspecified: Secondary | ICD-10-CM

## 2022-03-27 DIAGNOSIS — R7303 Prediabetes: Secondary | ICD-10-CM | POA: Diagnosis not present

## 2022-03-27 DIAGNOSIS — I493 Ventricular premature depolarization: Secondary | ICD-10-CM | POA: Diagnosis not present

## 2022-03-27 DIAGNOSIS — R269 Unspecified abnormalities of gait and mobility: Secondary | ICD-10-CM | POA: Diagnosis not present

## 2022-03-27 DIAGNOSIS — G4733 Obstructive sleep apnea (adult) (pediatric): Secondary | ICD-10-CM | POA: Diagnosis not present

## 2022-03-27 MED ORDER — METHYLPREDNISOLONE 4 MG PO TBPK: ORAL_TABLET | ORAL | 0 refills | Status: DC

## 2022-03-27 NOTE — Assessment & Plan Note (Signed)
-  Last A1c 5.4, will repeat A1c today. Recommend to continue weight loss efforts with diet changes including monitoring carbohydrates and sugar intake. Continue medication therapy. Continue to follow-up with [facility].

## 2022-03-27 NOTE — Progress Notes (Signed)
Established patient visit   Patient: [name]   DOB: April 11, 1951   71 y.o. Female  MRN: [id number] Visit Date: 03/27/2022  Chief Complaint  Patient presents with   Follow-up   Subjective    HPI  Patient presents for chronic follow-up. Patient reports fell a few days ago at the nail salon. Tripped over a box when coming out of the bathroom. Reports has been applying ice and taking Ibuprofen to help with low back pain mostly right sided which is the side she fell on. No head injury or LOC.  Prediabetes: No increased urination or thirst. On Ozempic which she tolerates without issues. Followed by [facility].  Heart/PVCs: Pt denies chest pain or dizziness. Reports was started on amiodarone which has reduced the frequency of heart palpitations/fluttering sensation. States has noticed more lower extremity swelling with reduced dose of spironolactone 12.5 mg daily. Some of her medications were reduced by nephrology to help with CKD.   HLD: Pt reports tolerating rosuvastatin 10 mg and fenofibrate 48 mg without issues.   Neuropathy: Reports Gabapentin helps to make her symptoms tolerable. Dose has been decreased to help with kidney function.   Medications: Outpatient Medications Prior to Visit  Medication Sig   amiodarone (PACERONE) 200 MG tablet Take 1 tablet (200 mg total) by mouth daily.   aspirin EC 81 MG tablet Take 81 mg by mouth at bedtime.   cetirizine (ZYRTEC) 10 MG tablet Take 1 tablet (10 mg total) by mouth daily.   cholestyramine (QUESTRAN) 4 GM/DOSE powder Take by mouth 3 (three) times daily with meals. As needed   cycloSPORINE (RESTASIS) 0.05 % ophthalmic emulsion Place 2 drops into both eyes 2 (two) times daily.   diphenoxylate-atropine (LOMOTIL) 2.5-0.025 MG per tablet Take 1 tablet by mouth 4 (four) times daily as needed for diarrhea or loose stools.   doxylamine, Sleep, (UNISOM) 25 MG tablet Take 1 tablet (25 mg total) by mouth at bedtime as needed.   fenofibrate (TRICOR) 48 MG  tablet Take 1 tablet (48 mg total) by mouth daily.   fluticasone (FLONASE) 50 MCG/ACT nasal spray Place 1 spray into both nostrils daily. (Patient taking differently: Place 1 spray into both nostrils as needed.)   gabapentin (NEURONTIN) 300 MG capsule TAKE 2 CAPSULES BY MOUTH 2 TIMES DAILY.   hydroxychloroquine (PLAQUENIL) 200 MG tablet Take 1 tablet (200 mg total) by mouth daily. (Patient taking differently: Take 200 mg by mouth daily. 1 tab M-F only)   hyoscyamine (ANASPAZ) 0.125 MG TBDP disintergrating tablet Place 0.25 mg under the tongue every 6 (six) hours as needed for cramping.    leflunomide (ARAVA) 20 MG tablet Take 1 tablet (20 mg total) by mouth daily.   lidocaine (LIDODERM) 5 % PLACE 1 PATCH ONTO THE SKIN DAILY AS NEEDED FOR PAIN. REMOVE AND DISCARD PATCH WITHIN 12 HOURS OR AS DIRECTED BY MD   metoprolol succinate (TOPROL-XL) 25 MG 24 hr tablet Take 0.5 tablets (12.5 mg total) by mouth daily.   NONFORMULARY OR COMPOUNDED ITEM Triamcinolone 0.1% & Silvadene cream 1:1- Apply as directed to affected areas as needed   omeprazole (PRILOSEC) 40 MG capsule Take 40 mg by mouth as needed.   potassium chloride (KLOR-CON) 10 MEQ tablet Take 2 tablets (20 mEq total) by mouth daily.   Probiotic Product (PROBIOTIC DAILY PO) Take by mouth. Ultraflora IB Probiotic   promethazine (PHENERGAN) 25 MG tablet TAKE 1 TABLET BY MOUTH EVERY 6 HOURS AS NEEDED.   rosuvastatin (CRESTOR) 10 MG tablet Take 1  tablet (10 mg total) by mouth daily.   Semaglutide, 1 MG/DOSE, 4 MG/3ML SOPN Inject 1 mg as directed once a week.   spironolactone (ALDACTONE) 25 MG tablet Take 1 tablet (25 mg total) by mouth daily. (Patient taking differently: Take 12.5 mg by mouth daily.)   torsemide 40 MG TABS Take 40 mg by mouth daily.   UNABLE TO FIND CPAP: AT bedtime; setting is "12"   No facility-administered medications prior to visit.    Review of Systems Review of Systems:  A fourteen system review of systems was performed and  found to be positive as per HPI.   Last CBC Lab Results  Component Value Date   WBC 5.3 01/19/2022   HGB 13.6 01/19/2022   HCT 41.1 01/19/2022   MCV 86.5 01/19/2022   MCH 28.6 01/19/2022   RDW 13.1 01/19/2022   PLT 201 64/68/0321   Last metabolic panel Lab Results  Component Value Date   GLUCOSE 87 01/19/2022   NA 142 01/19/2022   K 4.0 01/19/2022   CL 101 01/19/2022   CO2 25 01/19/2022   BUN 34 (H) 01/19/2022   CREATININE 1.96 (H) 01/19/2022   EGFR 27 (L) 01/19/2022   CALCIUM 9.3 01/19/2022   PHOS 3.6 08/20/2018   PROT 6.8 01/19/2022   ALBUMIN 4.5 11/18/2021   LABGLOB 2.6 10/11/2021   AGRATIO 1.5 10/11/2021   BILITOT 0.4 01/19/2022   ALKPHOS 98 11/18/2021   AST 25 01/19/2022   ALT 21 01/19/2022   ANIONGAP 13 08/05/2019   Last lipids Lab Results  Component Value Date   CHOL 118 12/19/2021   HDL 55 12/19/2021   LDLCALC 45 12/19/2021   TRIG 96 12/19/2021   CHOLHDL 2.1 12/19/2021   Last hemoglobin A1c Lab Results  Component Value Date   HGBA1C 5.4 10/11/2021   Last thyroid functions Lab Results  Component Value Date   TSH 0.883 11/18/2021   T3TOTAL 141 12/01/2019   Last vitamin D Lab Results  Component Value Date   VD25OH 92.2 09/23/2020       Objective    BP 121/71   Pulse 67   Temp 97.7 F (36.5 C)   Ht 5' 2.5" (1.588 m)   Wt 203 lb (92.1 kg)   SpO2 96%   BMI 36.54 kg/m  BP Readings from Last 3 Encounters:  03/27/22 121/71  03/06/22 102/71  02/07/22 130/79   Wt Readings from Last 3 Encounters:  03/27/22 203 lb (92.1 kg)  03/06/22 201 lb (91.2 kg)  02/07/22 195 lb (88.5 kg)    Physical Exam  General:  Well Developed, well nourished, appropriate for stated age.  Neuro:  Alert and oriented,  extra-ocular muscles intact  HEENT:  Normocephalic, atraumatic, neck supple  Skin:  no gross rash, warm, pink. Cardiac:  RRR, S1 S2 Respiratory: CTA B/L  MSK: tenderness of right low back, +scoliosis  Vascular:  Ext warm, no cyanosis  apprec.; cap RF less 2 sec. 1+ pitting edema bilaterally Psych:  No HI/SI, judgement and insight good, Euthymic mood. Full Affect.   No results found for any visits on 03/27/22.  Assessment & Plan      Problem List Items Addressed This Visit       Cardiovascular and Mediastinum   PVC's (premature ventricular contractions)    -Followed by cardiology. -Improved with medication therapy. On amiodarone 200 mg daily.        Nervous and Auditory   Peripheral polyneuropathy    -Followed by neurology. -On Gabapentin  300 mg 2 capsules BID.        Other   Mixed hyperlipidemia    -Last lipid panel: HDL 55, LDL 45, triglycerides 96 -11/15/2021 CT Cardiac scoring: IMPRESSION:Coronary calcium score of 856. This was 97th percentile for age-,race-, and sex-matched controls. -Pt is fasting today so will repeat lipid panel and hepatic function. Recommend to continue current medication regimen.      Relevant Orders   Lipid panel   Comprehensive metabolic panel   Hemoglobin A1c   Prediabetes - Primary    -Last A1c 5.4, will repeat A1c today. Recommend to continue weight loss efforts with diet changes including monitoring carbohydrates and sugar intake. Continue medication therapy. Continue to follow-up with [facility].      Relevant Orders   Lipid panel   Comprehensive metabolic panel   Hemoglobin A1c   Other Visit Diagnoses     Acute right-sided low back pain without sciatica       Relevant Medications   methylPREDNISolone (MEDROL DOSEPAK) 4 MG TBPK tablet   Stage 3b chronic kidney disease (HCC)       Relevant Orders   Lipid panel   Comprehensive metabolic panel   Hemoglobin A1c      Stage 3b CKD: -Followed by nephrology.  -Recommend to avoid NSAIDs, will send rx for oral steroid taper to help with acute back pain.   Return in about 6 months (around 09/27/2022) for [facility] with [facility] few days before.        Lorrene Reid, PA-C  [facility] at [facility] [phone]  (phone) [phone] (fax)  [place]

## 2022-03-27 NOTE — Patient Instructions (Signed)
Understanding Your Risk for Falls Each year, millions of people have serious injuries from falls. It is important to understand your risk for falling. Talk with your health care provider about your risk and what you can do to lower it. There are actions you can take at home to lower your risk and prevent falls. If you do have a serious fall, make sure to tell your health care provider. Falling once raises your risk of falling again. How can falls affect me? Serious injuries from falls are common. These include: Broken bones, such as hip fractures. Head injuries, such as traumatic brain injuries (TBI) or concussion. A fear of falling can cause you to avoid activities and stay at home. This can make your muscles weaker and actually raise your risk for a fall. What can increase my risk? There are a number of risk factors that increase your risk for falling. The more risk factors you have, the higher your risk of falling. Serious injuries from a fall happen most often to people older than age 78. Children and young adults ages 38-29 are also at higher risk. Common risk factors include: Weakness in the lower body. Lack (deficiency) of vitamin D. Being generally weak or confused due to long-term (chronic) illness. Dizziness or balance problems. Poor vision. Medicines that cause dizziness or drowsiness. These can include medicines for your blood pressure, heart, anxiety, insomnia, or edema, as well as pain medicines and muscle relaxants. Other risk factors include: Drinking alcohol. Having had a fall in the past. Having depression. Having foot pain or wearing improper footwear. Working at a dangerous job. Having any of the following in your home: Tripping hazards, such as floor clutter or loose rugs. Poor lighting. Pets. Having dementia or memory loss. What actions can I take to lower my risk of falling?     Physical activity Maintain physical fitness. Do strength and balance exercises.  Consider taking a regular class to build strength and balance. Yoga and tai chi are good options. Vision Have your eyes checked every year and your vision prescription updated as needed. Walking aids and footwear Wear nonskid shoes. Do not wear high heels. Do not walk around the house in socks or slippers. Use a cane or walker as told by your health care provider. Home safety Attach secure railings on both sides of your stairs. Install grab bars for your tub, shower, and toilet. Use a bath mat in your tub or shower. Use good lighting in all rooms. Keep a flashlight near your bed. Make sure there is a clear path from your bed to the bathroom. Use night-lights. Do not use throw rugs. Make sure all carpeting is taped or tacked down securely. Remove all clutter from walkways and stairways, including extension cords. Repair uneven or broken steps. Avoid walking on icy or slippery surfaces. Walk on the grass instead of on icy or slick sidewalks. Use ice melt to get rid of ice on walkways. Use a cordless phone. Questions to ask your health care provider Can you help me check my risk for a fall? Do any of my medicines make me more likely to fall? Should I take a vitamin D supplement? What exercises can I do to improve my strength and balance? Should I make an appointment to have my vision checked? Do I need a bone density test to check for weak bones or osteoporosis? Would it help to use a cane or a walker? Where to find more information Centers for Disease Control and Prevention,  [place]: www.cdc.gov Community-Based Fall Prevention Programs: www.cdc.gov National Institute on Aging: www.nia.nih.gov Contact a health care provider if: You fall at home. You are afraid of falling at home. You feel weak, drowsy, or dizzy. Summary Serious injuries from a fall happen most often to people older than age 65. Children and young adults ages 15-29 are also at higher risk. Talk with your health care  provider about your risks for falling and how to lower those risks. Taking certain precautions at home can lower your risk for falling. If you fall, always tell your health care provider. This information is not intended to replace advice given to you by your health care provider. Make sure you discuss any questions you have with your health care provider. Document Revised: 03/03/2020 Document Reviewed: 03/03/2020 Elsevier Patient Education  2023 Elsevier Inc.  

## 2022-03-27 NOTE — Assessment & Plan Note (Signed)
-  Followed by cardiology. -Improved with medication therapy. On amiodarone 200 mg daily.

## 2022-03-27 NOTE — Assessment & Plan Note (Signed)
-  Followed by neurology. -On Gabapentin 300 mg 2 capsules BID.

## 2022-03-27 NOTE — Assessment & Plan Note (Signed)
-  Last lipid panel: HDL 55, LDL 45, triglycerides 96 -11/15/2021 CT Cardiac scoring: IMPRESSION:Coronary calcium score of 856. This was 97th percentile for age-,race-, and sex-matched controls. -Pt is fasting today so will repeat lipid panel and hepatic function. Recommend to continue current medication regimen.

## 2022-03-28 LAB — COMPREHENSIVE METABOLIC PANEL
ALT: 14 IU/L (ref 0–32)
AST: 27 IU/L (ref 0–40)
Albumin/Globulin Ratio: 1.8 (ref 1.2–2.2)
Albumin: 4.3 g/dL (ref 3.9–4.9)
Alkaline Phosphatase: 80 IU/L (ref 44–121)
BUN/Creatinine Ratio: 25 (ref 12–28)
BUN: 35 mg/dL — ABNORMAL HIGH (ref 8–27)
Bilirubin Total: 0.5 mg/dL (ref 0.0–1.2)
CO2: 19 mmol/L — ABNORMAL LOW (ref 20–29)
Calcium: 9.3 mg/dL (ref 8.7–10.3)
Chloride: 103 mmol/L (ref 96–106)
Creatinine, Ser: 1.4 mg/dL — ABNORMAL HIGH (ref 0.57–1.00)
Globulin, Total: 2.4 g/dL (ref 1.5–4.5)
Glucose: 84 mg/dL (ref 70–99)
Potassium: 4.4 mmol/L (ref 3.5–5.2)
Sodium: 140 mmol/L (ref 134–144)
Total Protein: 6.7 g/dL (ref 6.0–8.5)
eGFR: 41 mL/min/{1.73_m2} — ABNORMAL LOW (ref >59)

## 2022-03-28 LAB — LIPID PANEL
Chol/HDL Ratio: 2.3 ratio (ref 0.0–4.4)
Cholesterol, Total: 157 mg/dL (ref 100–199)
HDL: 68 mg/dL (ref >39)
LDL Chol Calc (NIH): 64 mg/dL (ref 0–99)
Triglycerides: 145 mg/dL (ref 0–149)
VLDL Cholesterol Cal: 25 mg/dL (ref 5–40)

## 2022-03-28 LAB — HEMOGLOBIN A1C
Est. average glucose Bld gHb Est-mCnc: 105 mg/dL
Hgb A1c MFr Bld: 5.3 % (ref 4.8–5.6)

## 2022-03-31 ENCOUNTER — Encounter (HOSPITAL_BASED_OUTPATIENT_CLINIC_OR_DEPARTMENT_OTHER): Payer: Self-pay | Admitting: Internal Medicine

## 2022-03-31 ENCOUNTER — Ambulatory Visit (HOSPITAL_BASED_OUTPATIENT_CLINIC_OR_DEPARTMENT_OTHER): Payer: PPO | Admitting: Internal Medicine

## 2022-03-31 ENCOUNTER — Encounter: Payer: Self-pay | Admitting: Physician Assistant

## 2022-03-31 VITALS — BP 116/68 | HR 74 | Ht 62.0 in | Wt 204.1 lb

## 2022-03-31 DIAGNOSIS — G4733 Obstructive sleep apnea (adult) (pediatric): Secondary | ICD-10-CM | POA: Diagnosis not present

## 2022-03-31 DIAGNOSIS — E782 Mixed hyperlipidemia: Secondary | ICD-10-CM

## 2022-03-31 DIAGNOSIS — I5032 Chronic diastolic (congestive) heart failure: Secondary | ICD-10-CM | POA: Diagnosis not present

## 2022-03-31 DIAGNOSIS — I10 Essential (primary) hypertension: Secondary | ICD-10-CM | POA: Diagnosis not present

## 2022-03-31 NOTE — Patient Instructions (Addendum)
Medication Instructions:  NO CHANGES  *If you need a refill on your cardiac medications before your next appointment, please call your pharmacy*   Follow Up: At [facility], you and your health needs are our priority.  As part of our continuing mission to provide you with exceptional heart care, we have created designated Provider Care Teams.  These Care Teams include your primary Cardiologist (physician) and Advanced Practice Providers (APPs -  Physician Assistants and Nurse Practitioners) who all work together to provide you with the care you need, when you need it.  We recommend signing up for the patient portal called "MyChart".  Sign up information is provided on this After Visit Summary.  MyChart is used to connect with patients for Virtual Visits (Telemedicine).  Patients are able to view lab/test results, encounter notes, upcoming appointments, etc.  Non-urgent messages can be sent to your provider as well.   To learn more about what you can do with MyChart, go to [email or web address].    Your next appointment:    AS NEEDED with Dr. Debara Pickett

## 2022-04-02 NOTE — Progress Notes (Signed)
LIPID CLINIC CONSULT NOTE  Chief Complaint:  Dyslipidemia  Primary Care Physician: Lorrene Reid, PA-C  Primary Cardiologist:  Fransico Him, MD  HPI:  [name] is a 71 y.o. female who is being seen today for the evaluation of dyslipidemia at the request of Turner, Traci R, MD. this is a pleasant 71 year old female with a history of chronic diastolic heart failure, normal coronary arteries, morbid obesity and obstructive sleep apnea followed by Dr. Radford Pax.  She was referred for dyslipidemia, particularly high triglycerides.  Lipid profile in February 2023 showed total cholesterol 187, triglycerides 576, HDL 35, LDL 64.  Her baseline triglycerides appear to be in the 2-300 range.  More recently she had a follow-up lipid profile.  This was on treatment with 20 mg of rosuvastatin and fenofibrate 48 m daily.  Her labs had come down to cholesterol 118, triglycerides 96, HDL 55 and LDL 45, but subsequently 3 months later the cholesterol had gone up somewhat to total 157, triglycerides 145, HDL 68 and LDL 64.  PMHx:  Past Medical History:  Diagnosis Date   Arthritis    Rheumatoid arthritis,    Back pain    Blood transfusion    1981   CHF (congestive heart failure) (HCC)    Chronic diastolic CHF (congestive heart failure) (HCC)    diastolic    DDD (degenerative disc disease), cervical    DDD (degenerative disc disease), lumbar    Edema of both lower extremities    Fibromyalgia    Gallbladder problem    GERD (gastroesophageal reflux disease)    Heart murmur    as a child   Hiatal hernia    sjorgens syndrome   High blood pressure    High cholesterol    IBS (irritable bowel syndrome)    Joint pain    Numbness and tingling of both feet    Numbness of fingers    OSA (obstructive sleep apnea)    Osteoarthritis    Peripheral autonomic neuropathy of unknown cause    Pneumonia 07/2018   Polyneuropathy    Pre-diabetes    PVC (premature ventricular contraction)    Raynaud  disease    Rheumatoid arthritis (HCC)    Sjogren's disease ([place])    Sleep apnea    SOB (shortness of breath)    Swallowing difficulty    Urticaria    Vitamin D deficiency     Past Surgical History:  Procedure Laterality Date   ABDOMINAL HYSTERECTOMY     BTL, BSO   ADENOIDECTOMY     BREAST EXCISIONAL BIOPSY Left    BREAST SURGERY     mass removal    CHOLECYSTECTOMY     dental implants     DIAGNOSTIC LAPAROSCOPY     x3   KNEE ARTHROSCOPY     x2   MASS EXCISION Left 03/22/2017   Procedure: EXCISION OF LEFT BREAST MASS;  Surgeon: Donnie Mesa, MD;  Location: [facility];  Service: General;  Laterality: Left;   patotid cystectomy     RIGHT/LEFT HEART CATH AND CORONARY ANGIOGRAPHY N/A 07/25/2019   Procedure: RIGHT/LEFT HEART CATH AND CORONARY ANGIOGRAPHY;  Surgeon: Jolaine Artist, MD;  Location: [place] [facility];  Service: Cardiovascular;  Laterality: N/A;   TENDON REPAIR  1980   left ankle and tibia   TONSILLECTOMY     TOTAL KNEE ARTHROPLASTY  06/21/2011   Procedure: TOTAL KNEE ARTHROPLASTY;  Surgeon: Gearlean Alf;  Location: [facility];  Service: Orthopedics;  Laterality:  Right;   TUBAL LIGATION      FAMHx:  Family History  Problem Relation Age of Onset   Alzheimer's disease Mother    Heart attack Mother    Hypertension Mother    Glaucoma Mother    High Cholesterol Mother    Heart disease Mother    Depression Mother    Cancer Father    High Cholesterol Father    Heart disease Father    Sleep apnea Father    Breast cancer Maternal Aunt    Heart attack Brother    Heart disease Brother    Glaucoma Brother    Hyperlipidemia Brother    Glaucoma Brother    Asthma Son    Allergic rhinitis Neg Hx    Angioedema Neg Hx    Eczema Neg Hx    Urticaria Neg Hx    Neuropathy Neg Hx     SOCHx:   reports that she has never smoked. She has been exposed to tobacco smoke. She has never used smokeless tobacco. She reports current alcohol use. She reports that she does not  use drugs.  ALLERGIES:  Allergies  Allergen Reactions   Sulfa Antibiotics Hives and Itching    Flushing also   Cymbalta [Duloxetine Hcl] Other (See Comments)    Caused a manic reaction   Demerol Hives and Other (See Comments)    Fever also   Ivp Dye [Iodinated Contrast Media] Hives and Other (See Comments)    Fever also   Morphine And Related Other (See Comments)    ineffective   Sulfasalazine Other (See Comments)    Reaction ??   Adhesive [Tape] Rash    ROS: Pertinent items noted in HPI and remainder of comprehensive ROS otherwise negative.  HOME MEDS: Current Outpatient Medications on File Prior to Visit  Medication Sig Dispense Refill   amiodarone (PACERONE) 200 MG tablet Take 1 tablet (200 mg total) by mouth daily. 90 tablet 3   aspirin EC 81 MG tablet Take 81 mg by mouth at bedtime.     cetirizine (ZYRTEC) 10 MG tablet Take 1 tablet (10 mg total) by mouth daily. 90 tablet 0   cholestyramine (QUESTRAN) 4 GM/DOSE powder Take by mouth 3 (three) times daily with meals. As needed     cycloSPORINE (RESTASIS) 0.05 % ophthalmic emulsion Place 2 drops into both eyes 2 (two) times daily.     diphenoxylate-atropine (LOMOTIL) 2.5-0.025 MG per tablet Take 1 tablet by mouth 4 (four) times daily as needed for diarrhea or loose stools.     doxylamine, Sleep, (UNISOM) 25 MG tablet Take 1 tablet (25 mg total) by mouth at bedtime as needed. 30 tablet 0   fenofibrate (TRICOR) 48 MG tablet Take 1 tablet (48 mg total) by mouth daily. 90 tablet 0   fluticasone (FLONASE) 50 MCG/ACT nasal spray Place 1 spray into both nostrils daily. (Patient taking differently: Place 1 spray into both nostrils as needed.) 16 g 0   gabapentin (NEURONTIN) 300 MG capsule TAKE 2 CAPSULES BY MOUTH 2 TIMES DAILY. 360 capsule 3   hydroxychloroquine (PLAQUENIL) 200 MG tablet Take 1 tablet (200 mg total) by mouth daily. (Patient taking differently: Take 200 mg by mouth daily. 1 tab M-F only) 90 tablet 0   hyoscyamine  (ANASPAZ) 0.125 MG TBDP disintergrating tablet Place 0.25 mg under the tongue every 6 (six) hours as needed for cramping.      leflunomide (ARAVA) 20 MG tablet Take 1 tablet (20 mg total) by mouth daily. [place]  tablet 0   lidocaine (LIDODERM) 5 % PLACE 1 PATCH ONTO THE SKIN DAILY AS NEEDED FOR PAIN. REMOVE AND DISCARD PATCH WITHIN 12 HOURS OR AS DIRECTED BY MD 90 patch 0   methylPREDNISolone (MEDROL DOSEPAK) 4 MG TBPK tablet Take as directed 21 tablet 0   metoprolol succinate (TOPROL-XL) 25 MG 24 hr tablet Take 0.5 tablets (12.5 mg total) by mouth daily. 45 tablet 3   NONFORMULARY OR COMPOUNDED ITEM Triamcinolone 0.1% & Silvadene cream 1:1- Apply as directed to affected areas as needed     omeprazole (PRILOSEC) 40 MG capsule Take 40 mg by mouth as needed.     potassium chloride (KLOR-CON) 10 MEQ tablet Take 2 tablets (20 mEq total) by mouth daily. 180 tablet 3   Probiotic Product (PROBIOTIC DAILY PO) Take by mouth. Ultraflora IB Probiotic     promethazine (PHENERGAN) 25 MG tablet TAKE 1 TABLET BY MOUTH EVERY 6 HOURS AS NEEDED. 90 tablet 0   rosuvastatin (CRESTOR) 10 MG tablet Take 1 tablet (10 mg total) by mouth daily. 90 tablet 3   Semaglutide, 1 MG/DOSE, 4 MG/3ML SOPN Inject 1 mg as directed once a week. 3 mL 0   spironolactone (ALDACTONE) 25 MG tablet Take 1 tablet (25 mg total) by mouth daily. (Patient taking differently: Take 12.5 mg by mouth daily.) 180 tablet 3   torsemide 40 MG TABS Take 40 mg by mouth daily. 30 tablet 0   UNABLE TO FIND CPAP: AT bedtime; setting is "12"     No current facility-administered medications on file prior to visit.    LABS/IMAGING: No results found for this or any previous visit (from the past 48 hour(s)). No results found.  LIPID PANEL:    Component Value Date/Time   CHOL 157 03/27/2022 1417   TRIG 145 03/27/2022 1417   HDL 68 03/27/2022 1417   CHOLHDL 2.3 03/27/2022 1417   CHOLHDL 4.7 08/11/2016 1017   VLDL 59 (H) 08/11/2016 1017   LDLCALC 64  03/27/2022 1417    WEIGHTS: Wt Readings from Last 3 Encounters:  03/31/22 204 lb 1.6 oz (92.6 kg)  03/27/22 203 lb (92.1 kg)  03/06/22 201 lb (91.2 kg)    VITALS: BP 116/68   Pulse 74   Ht '5\' 2"'$  (1.575 m)   Wt 204 lb 1.6 oz (92.6 kg)   SpO2 95%   BMI 37.33 kg/m   EXAM: Deferred  EKG: Deferred  ASSESSMENT: Mixed dyslipidemia with high triglycerides  PLAN: 1.   Ms. Bradshaw has a mixed dyslipidemia with a history of high triglycerides however her last 2 lab readings show good control of her triglycerides and a relatively low LDL cholesterol.  She seems to be tolerating a lower dose fenofibrate and rosuvastatin.  Her ALT was recently normal at 14.  The fenofibrate is dose adjusted for creatinine of 1.4 (GFR less than 50).  Overall she seems to be doing appropriately better with her lipids on this current regimen.  I would continue that as long she tolerates it.  No further recommendations at this time.  If her triglycerides go higher I would suspect to be related to diet or an activity.  Could consider the addition of omega-3 fatty acids such as Vascepa if her triglycerides go back up.  We will plan follow-up as needed.  Thanks again for the kind referral.  Pixie Casino, MD, FACC, [place] Director of the [facility]  [facility]  Diplomate of the [employer] of Clinical Lipidology Attending Cardiologist  Direct Dial: [phone]  Fax: [phone]  Website:  [email or web address] 04/02/2022, 9:49 PM

## 2022-04-04 ENCOUNTER — Encounter (INDEPENDENT_AMBULATORY_CARE_PROVIDER_SITE_OTHER): Payer: Self-pay

## 2022-04-04 ENCOUNTER — Ambulatory Visit (INDEPENDENT_AMBULATORY_CARE_PROVIDER_SITE_OTHER): Payer: PPO | Admitting: Family Medicine

## 2022-04-05 ENCOUNTER — Encounter: Payer: Self-pay | Admitting: Rheumatology

## 2022-04-05 ENCOUNTER — Telehealth: Payer: Self-pay | Admitting: Rheumatology

## 2022-04-05 DIAGNOSIS — Z79899 Other long term (current) drug therapy: Secondary | ICD-10-CM

## 2022-04-05 NOTE — Telephone Encounter (Signed)
Did autoimmune labs in June.  Please have her come in for autoimmune labs.  She recently had CBC with differential and CMP with GFR.  The labs should include urine protein creatinine ratio, ANA, dsDNA, complements and sed rate.

## 2022-04-05 NOTE — Telephone Encounter (Signed)
See mychart message for details.

## 2022-04-05 NOTE — Telephone Encounter (Signed)
Patient called the office stating she has a new rash on her face and is concerned she may have Lupus because she has other autoimmune illnesses. Patient requests a call back on what to do.

## 2022-04-07 ENCOUNTER — Other Ambulatory Visit: Payer: Self-pay

## 2022-04-07 DIAGNOSIS — Z79899 Other long term (current) drug therapy: Secondary | ICD-10-CM

## 2022-04-08 LAB — ANTI-DNA ANTIBODY, DOUBLE-STRANDED: ds DNA Ab: <1 IU/mL

## 2022-04-08 LAB — PROTEIN / CREATININE RATIO, URINE
Creatinine, Urine: 24 mg/dL (ref 20–275)
Total Protein, Urine: <4 mg/dL — ABNORMAL LOW (ref 5–24)

## 2022-04-08 LAB — C3 AND C4
C3 Complement: 177 mg/dL (ref 83–193)
C4 Complement: 25 mg/dL (ref 15–57)

## 2022-04-08 LAB — ANA: Anti Nuclear Antibody (ANA): NEGATIVE

## 2022-04-08 LAB — SEDIMENTATION RATE: Sed Rate: 29 mm/h (ref 0–30)

## 2022-04-10 NOTE — Progress Notes (Signed)
Urine protein is negative, ANA negative, double-stranded DNA negative, complements normal, sed rate normal.  Labs do not indicate an active autoimmune disease.

## 2022-04-11 ENCOUNTER — Encounter (INDEPENDENT_AMBULATORY_CARE_PROVIDER_SITE_OTHER): Payer: Self-pay | Admitting: Family Medicine

## 2022-04-11 ENCOUNTER — Telehealth (INDEPENDENT_AMBULATORY_CARE_PROVIDER_SITE_OTHER): Payer: PPO | Admitting: Family Medicine

## 2022-04-11 DIAGNOSIS — E669 Obesity, unspecified: Secondary | ICD-10-CM | POA: Diagnosis not present

## 2022-04-11 DIAGNOSIS — N289 Disorder of kidney and ureter, unspecified: Secondary | ICD-10-CM

## 2022-04-11 DIAGNOSIS — R7309 Other abnormal glucose: Secondary | ICD-10-CM | POA: Diagnosis not present

## 2022-04-11 DIAGNOSIS — Z6838 Body mass index (BMI) 38.0-38.9, adult: Secondary | ICD-10-CM | POA: Diagnosis not present

## 2022-04-11 MED ORDER — SEMAGLUTIDE (1 MG/DOSE) 4 MG/3ML ~~LOC~~ SOPN: 1.0000 mg | PEN_INJECTOR | SUBCUTANEOUS | 0 refills | Status: DC

## 2022-04-12 DIAGNOSIS — H2513 Age-related nuclear cataract, bilateral: Secondary | ICD-10-CM | POA: Diagnosis not present

## 2022-04-12 DIAGNOSIS — H16202 Unspecified keratoconjunctivitis, left eye: Secondary | ICD-10-CM | POA: Diagnosis not present

## 2022-04-13 DIAGNOSIS — E785 Hyperlipidemia, unspecified: Secondary | ICD-10-CM | POA: Diagnosis not present

## 2022-04-13 DIAGNOSIS — N184 Chronic kidney disease, stage 4 (severe): Secondary | ICD-10-CM | POA: Diagnosis not present

## 2022-04-17 NOTE — Progress Notes (Signed)
TeleHealth Visit:  Due to the COVID-19 pandemic, this visit was completed with telemedicine (audio/video) technology to reduce patient and provider exposure as well as to preserve personal protective equipment.   [name] has verbally consented to this TeleHealth visit. The patient is located at home, the provider is located at the [facility] office. The participants in this visit include the listed provider and patient. The visit was conducted today via Mychart video.  Chief Complaint: OBESITY [name] is here to discuss her progress with her obesity treatment plan along with follow-up of her obesity related diagnoses. [name] is on the Category 2 Plan and states she is following her eating plan approximately 85% of the time. [name] states she is walking 20 minutes 5 times per week.  Today's visit was #: 45 Starting weight: 239 lbs Starting date: 12/01/2019  Interim History: [name] has an eye infection and cannot drive currently. She is trying to stick to Category 2 and get in all of her food. [name] mentions that she just doesn't eat as much as she used too. She has occasionally indulged, but has mostly stayed within her calorie allotment. Sometimes, she can't eat all the protein in her meal. [name] doesn't eat fruit everyday.   Subjective:   1. Elevated random blood glucose level [name] is on Ozempic '1mg'$  weekly. [name] is still working on getting in all of her food. She denies GI side effects.  2. Abnormal kidney function [name]'s last creatinine was 1.4 and her BUN was 36. She sees nephrology.  Assessment/Plan:   1. Elevated random blood glucose level [name] agrees to continue taking Ozempic '1mg'$  and will follow up as directed.  - Semaglutide, 1 MG/DOSE, 4 MG/3ML SOPN; Inject 1 mg as directed once a week.  Dispense: 3 mL; Refill: 0  2. Abnormal kidney function [name] agrees to follow up her kidney functions with nephrology at her next scheduled appointment.  3. Obesity with current BMI of  38.0 [name] is currently in the action stage of change. As such, her goal is to continue with weight loss efforts. She has agreed to the Category 2 Plan.   Exercise goals: Older adults should follow the adult guidelines. When older adults cannot meet the adult guidelines, they should be as physically active as their abilities and conditions will allow.   Behavioral modification strategies: increasing lean protein intake, meal planning and cooking strategies, keeping healthy foods in the home, and planning for success.  [name] has agreed to follow-up with our clinic in 4 weeks. She was informed of the importance of frequent follow-up visits to maximize her success with intensive lifestyle modifications for her multiple health conditions.  Objective:   VITALS: Per patient if applicable, see vitals. GENERAL: Alert and in no acute distress. CARDIOPULMONARY: No increased WOB. Speaking in clear sentences.  PSYCH: Pleasant and cooperative. Speech normal rate and rhythm. Affect is appropriate. Insight and judgement are appropriate. Attention is focused, linear, and appropriate.  NEURO: Oriented as arrived to appointment on time with no prompting.   Lab Results  Component Value Date   CREATININE 1.40 (H) 03/27/2022   BUN 35 (H) 03/27/2022   NA 140 03/27/2022   K 4.4 03/27/2022   CL 103 03/27/2022   CO2 19 (L) 03/27/2022   Lab Results  Component Value Date   ALT 14 03/27/2022   AST 27 03/27/2022   ALKPHOS 80 03/27/2022   BILITOT 0.5 03/27/2022   Lab Results  Component Value Date   HGBA1C 5.3  03/27/2022   HGBA1C 5.4 10/11/2021   HGBA1C 5.2 01/31/2021   HGBA1C 5.5 09/23/2020   HGBA1C 5.8 (H) 05/11/2020   Lab Results  Component Value Date   INSULIN 14.2 01/31/2021   INSULIN 25.4 (H) 09/23/2020   INSULIN 21.0 06/01/2020   INSULIN 32.3 (H) 12/01/2019   Lab Results  Component Value Date   TSH 0.883 11/18/2021   Lab Results  Component Value Date   CHOL 157 03/27/2022   HDL 68  03/27/2022   LDLCALC 64 03/27/2022   TRIG 145 03/27/2022   CHOLHDL 2.3 03/27/2022   Lab Results  Component Value Date   VD25OH 92.2 09/23/2020   VD25OH 62.9 12/01/2019   VD25OH 61.3 08/26/2019   Lab Results  Component Value Date   WBC 5.3 01/19/2022   HGB 13.6 01/19/2022   HCT 41.1 01/19/2022   MCV 86.5 01/19/2022   PLT 201 01/19/2022   No results found for: "IRON", "TIBC", "FERRITIN"  Attestation Statements:   Reviewed by clinician on day of visit: allergies, medications, problem list, medical history, surgical history, family history, social history, and previous encounter notes.  IMarcille Blanco, CMA, am acting as transcriptionist for Coralie Common, MD  I have reviewed the above documentation for accuracy and completeness, and I agree with the above. - Coralie Common, MD

## 2022-04-18 ENCOUNTER — Encounter: Payer: Self-pay | Admitting: Physician Assistant

## 2022-04-18 ENCOUNTER — Telehealth: Payer: Self-pay | Admitting: *Deleted

## 2022-04-18 NOTE — Patient Outreach (Signed)
  Care Coordination   Initial Visit Note   04/18/2022 Name: [name] MRN: [id number] DOB: Nov 27, 1950  [name] is a 71 y.o. year old female who sees [name], [place] for primary care. I spoke with  [name] by phone today.  What matters to the patients health and wellness today?  No concerns expressed    Goals Addressed             This Visit's Progress    Patient advised to contact PCP for AWV and vaccines          SDOH assessments and interventions completed:  No     Care Coordination Interventions Activated:  Yes  Care Coordination Interventions:  Yes, provided   Follow up plan: No further intervention required.   Encounter Outcome:  [name] Care Management [phone]

## 2022-04-19 DIAGNOSIS — N1831 Chronic kidney disease, stage 3a: Secondary | ICD-10-CM | POA: Diagnosis not present

## 2022-04-19 DIAGNOSIS — E876 Hypokalemia: Secondary | ICD-10-CM | POA: Diagnosis not present

## 2022-04-19 DIAGNOSIS — I5032 Chronic diastolic (congestive) heart failure: Secondary | ICD-10-CM | POA: Diagnosis not present

## 2022-04-20 DIAGNOSIS — H16203 Unspecified keratoconjunctivitis, bilateral: Secondary | ICD-10-CM | POA: Diagnosis not present

## 2022-04-20 DIAGNOSIS — H2513 Age-related nuclear cataract, bilateral: Secondary | ICD-10-CM | POA: Diagnosis not present

## 2022-04-24 ENCOUNTER — Other Ambulatory Visit: Payer: PPO

## 2022-04-24 ENCOUNTER — Other Ambulatory Visit: Payer: Self-pay | Admitting: Physician Assistant

## 2022-04-24 DIAGNOSIS — M810 Age-related osteoporosis without current pathological fracture: Secondary | ICD-10-CM

## 2022-04-27 DIAGNOSIS — G4733 Obstructive sleep apnea (adult) (pediatric): Secondary | ICD-10-CM | POA: Diagnosis not present

## 2022-04-27 DIAGNOSIS — R269 Unspecified abnormalities of gait and mobility: Secondary | ICD-10-CM | POA: Diagnosis not present

## 2022-05-03 ENCOUNTER — Other Ambulatory Visit: Payer: Self-pay | Admitting: Adult Health

## 2022-05-03 ENCOUNTER — Other Ambulatory Visit (INDEPENDENT_AMBULATORY_CARE_PROVIDER_SITE_OTHER): Payer: Self-pay | Admitting: Family Medicine

## 2022-05-03 ENCOUNTER — Other Ambulatory Visit: Payer: Self-pay | Admitting: Physician Assistant

## 2022-05-03 ENCOUNTER — Ambulatory Visit: Payer: PPO | Admitting: Internal Medicine

## 2022-05-03 DIAGNOSIS — J3089 Other allergic rhinitis: Secondary | ICD-10-CM

## 2022-05-03 NOTE — Telephone Encounter (Signed)
Next Visit: 07/25/2022   Last Visit: 01/31/2022   Last Fill: 01/31/2022  Dx: Rheumatoid arthritis involving multiple sites with positive rheumatoid factor   Current Dose per office note on 01/31/2022: Arava 20 mg 1 tablet by mouth daily   Labs: 03/27/2022 CMP: BUN 35, Creat. 1.40, GFR 41, CO2 19, CBC 01/19/2022 Lymphs Ans 837  Okay to refill Arava?

## 2022-05-04 MED ORDER — LIDOCAINE 5 % EX PTCH: MEDICATED_PATCH | CUTANEOUS | 0 refills | Status: DC

## 2022-05-08 ENCOUNTER — Encounter (INDEPENDENT_AMBULATORY_CARE_PROVIDER_SITE_OTHER): Payer: Self-pay | Admitting: Family Medicine

## 2022-05-08 ENCOUNTER — Other Ambulatory Visit: Payer: Self-pay | Admitting: *Deleted

## 2022-05-08 ENCOUNTER — Ambulatory Visit (INDEPENDENT_AMBULATORY_CARE_PROVIDER_SITE_OTHER): Payer: PPO | Admitting: Family Medicine

## 2022-05-08 VITALS — BP 133/77 | HR 68 | Temp 98.2°F | Ht 61.0 in | Wt 198.0 lb

## 2022-05-08 DIAGNOSIS — R7309 Other abnormal glucose: Secondary | ICD-10-CM | POA: Diagnosis not present

## 2022-05-08 DIAGNOSIS — Z6837 Body mass index (BMI) 37.0-37.9, adult: Secondary | ICD-10-CM | POA: Diagnosis not present

## 2022-05-08 DIAGNOSIS — Z6841 Body Mass Index (BMI) 40.0 and over, adult: Secondary | ICD-10-CM

## 2022-05-08 DIAGNOSIS — E669 Obesity, unspecified: Secondary | ICD-10-CM

## 2022-05-08 DIAGNOSIS — E7849 Other hyperlipidemia: Secondary | ICD-10-CM | POA: Diagnosis not present

## 2022-05-08 MED ORDER — FENOFIBRATE 48 MG PO TABS: 48.0000 mg | ORAL_TABLET | Freq: Every day | ORAL | 0 refills | Status: DC

## 2022-05-08 MED ORDER — HYDROXYCHLOROQUINE SULFATE 200 MG PO TABS: 200.0000 mg | ORAL_TABLET | Freq: Every day | ORAL | 0 refills | Status: DC

## 2022-05-08 MED ORDER — SEMAGLUTIDE (1 MG/DOSE) 4 MG/3ML ~~LOC~~ SOPN: 1.0000 mg | PEN_INJECTOR | SUBCUTANEOUS | 0 refills | Status: DC

## 2022-05-08 NOTE — Telephone Encounter (Signed)
Next Visit: 07/25/2022  Last Visit: 01/31/2022  Labs: CMP 03/27/2022 BUN 35, Creat. 1.40, GFR 41, CO2 19, 01/19/2022 CBC: Lymph Abs 837  Eye exam: 02/08/2021 WNL   Current Dose per office note 01/31/2022: Plaquenil 200 mg 1 tablet by mouth by mouth daily  DX: Rheumatoid arthritis involving multiple sites with positive rheumatoid factor   Last Fill: 01/31/2022  Patient advised she is due to update her PLQ eye exam. Patient states she updated it in June 2023 and will call the eye doctor to fax records.   Okay to refill Plaquenil?

## 2022-05-10 ENCOUNTER — Telehealth: Payer: Self-pay

## 2022-05-10 NOTE — Telephone Encounter (Signed)
PA for Lidocaine 5% patches  sumbitted via CMM Key: [place] information has been sent to [employer].awaiting determination

## 2022-05-10 NOTE — Progress Notes (Unsigned)
Chief Complaint:   OBESITY [name] is here to discuss her progress with her obesity treatment plan along with follow-up of her obesity related diagnoses. [name] is on the Category 2 Plan and states she is following her eating plan approximately 25-30% of the time. [name] states she is walking the dog 7 times per week.  Today's visit was #: 50 Starting weight: 239 lbs Starting date: 12/01/2019 Today's weight: 198 lbs Today's date: 05/08/2022 Total lbs lost to date: 41 lbs Total lbs lost since last in-office visit: 3  Interim History: [name] has had some dental work done and has been eating mostly soft foods: Getting permanent crowns in a week. Going to [place] for a few days to take care of granddaughter. Feels some fatigue of following plan.   Subjective:   1. Other hyperlipidemia [name] is on Fenofibrate 48 mg daily. Last triglyceride 145.  2. Elevated random blood glucose level [name] is on Ozempic 1 mg weekly.  Assessment/Plan:   1. Other hyperlipidemia We will refill Tricor 48 mg by mouth daily for 3 months with 0 refills.  -Refill fenofibrate (TRICOR) 48 MG tablet; Take 1 tablet (48 mg total) by mouth daily.  Dispense: 90 tablet; Refill: 0  2. Elevated random blood glucose level We will refill Ozempic 1 mg SubQ once weekly for 1 month with 0 refills.  -Refill Semaglutide, 1 MG/DOSE, 4 MG/3ML SOPN; Inject 1 mg as directed once a week.  Dispense: 9 mL; Refill: 0  3. Obesity with current BMI of 37.4 [name] is currently in the action stage of change. As such, her goal is to continue with weight loss efforts. She has agreed to the Category 2 Plan and practicing portion control and making smarter food choices, such as increasing vegetables and decreasing simple carbohydrates.   Exercise goals: All adults should avoid inactivity. Some physical activity is better than none, and adults who participate in any amount of physical activity gain some health benefits.  Behavioral  modification strategies: increasing lean protein intake, meal planning and cooking strategies, and keeping healthy foods in the home.  [name] has agreed to follow-up with our clinic in 12 weeks. She was informed of the importance of frequent follow-up visits to maximize her success with intensive lifestyle modifications for her multiple health conditions.   Objective:   Blood pressure 133/77, pulse 68, temperature 98.2 F (36.8 C), height '5\' 1"'$  (1.549 m), weight 198 lb (89.8 kg), SpO2 96 %. Body mass index is 37.41 kg/m.  General: Cooperative, alert, well developed, in no acute distress. HEENT: Conjunctivae and lids unremarkable. Cardiovascular: Regular rhythm.  Lungs: Normal work of breathing. Neurologic: No focal deficits.   Lab Results  Component Value Date   CREATININE 1.40 (H) 03/27/2022   BUN 35 (H) 03/27/2022   NA 140 03/27/2022   K 4.4 03/27/2022   CL 103 03/27/2022   CO2 19 (L) 03/27/2022   Lab Results  Component Value Date   ALT 14 03/27/2022   AST 27 03/27/2022   ALKPHOS 80 03/27/2022   BILITOT 0.5 03/27/2022   Lab Results  Component Value Date   HGBA1C 5.3 03/27/2022   HGBA1C 5.4 10/11/2021   HGBA1C 5.2 01/31/2021   HGBA1C 5.5 09/23/2020   HGBA1C 5.8 (H) 05/11/2020   Lab Results  Component Value Date   INSULIN 14.2 01/31/2021   INSULIN 25.4 (H) 09/23/2020   INSULIN 21.0 06/01/2020   INSULIN 32.3 (H) 12/01/2019   Lab Results  Component Value Date  TSH 0.883 11/18/2021   Lab Results  Component Value Date   CHOL 157 03/27/2022   HDL 68 03/27/2022   LDLCALC 64 03/27/2022   TRIG 145 03/27/2022   CHOLHDL 2.3 03/27/2022   Lab Results  Component Value Date   VD25OH 92.2 09/23/2020   VD25OH 62.9 12/01/2019   VD25OH 61.3 08/26/2019   Lab Results  Component Value Date   WBC 5.3 01/19/2022   HGB 13.6 01/19/2022   HCT 41.1 01/19/2022   MCV 86.5 01/19/2022   PLT 201 01/19/2022   No results found for: "IRON", "TIBC", "FERRITIN"  Attestation  Statements:   Reviewed by clinician on day of visit: allergies, medications, problem list, medical history, surgical history, family history, social history, and previous encounter notes.  I, Elnora Morrison, RMA am acting as transcriptionist for Coralie Common, MD.  I have reviewed the above documentation for accuracy and completeness, and I agree with the above. - Coralie Common, MD

## 2022-05-14 DIAGNOSIS — R931 Abnormal findings on diagnostic imaging of heart and coronary circulation: Secondary | ICD-10-CM

## 2022-05-14 HISTORY — DX: Abnormal findings on diagnostic imaging of heart and coronary circulation: R93.1

## 2022-05-16 NOTE — Telephone Encounter (Signed)
Received fax stating Lidocaine 5% patch denied by insurance. It is only covered for post herpetic neuralgia. If appeal desired, fax within 60 days to [phone].

## 2022-05-24 ENCOUNTER — Encounter: Payer: Self-pay | Admitting: Physician Assistant

## 2022-05-25 ENCOUNTER — Encounter: Payer: Self-pay | Admitting: Cardiology

## 2022-05-25 ENCOUNTER — Ambulatory Visit: Payer: PPO | Attending: Cardiology | Admitting: Cardiology

## 2022-05-25 VITALS — BP 112/68 | HR 68 | Ht 61.0 in | Wt 195.0 lb

## 2022-05-25 DIAGNOSIS — I110 Hypertensive heart disease with heart failure: Secondary | ICD-10-CM

## 2022-05-25 DIAGNOSIS — G4733 Obstructive sleep apnea (adult) (pediatric): Secondary | ICD-10-CM

## 2022-05-25 DIAGNOSIS — I493 Ventricular premature depolarization: Secondary | ICD-10-CM | POA: Diagnosis not present

## 2022-05-25 DIAGNOSIS — E782 Mixed hyperlipidemia: Secondary | ICD-10-CM

## 2022-05-25 DIAGNOSIS — I10 Essential (primary) hypertension: Secondary | ICD-10-CM

## 2022-05-25 DIAGNOSIS — R931 Abnormal findings on diagnostic imaging of heart and coronary circulation: Secondary | ICD-10-CM | POA: Insufficient documentation

## 2022-05-25 DIAGNOSIS — I5032 Chronic diastolic (congestive) heart failure: Secondary | ICD-10-CM | POA: Diagnosis not present

## 2022-05-25 NOTE — Patient Instructions (Signed)
Medication Instructions:  Your physician recommends that you continue on your current medications as directed. Please refer to the Current Medication list given to you today.  *If you need a refill on your cardiac medications before your next appointment, please call your pharmacy*  Follow-Up: At [facility], you and your health needs are our priority.  As part of our continuing mission to provide you with exceptional heart care, we have created designated Provider Care Teams.  These Care Teams include your primary Cardiologist (physician) and Advanced Practice Providers (APPs -  Physician Assistants and Nurse Practitioners) who all work together to provide you with the care you need, when you need it.  Your next appointment:   1 year(s)  The format for your next appointment:   In Person  Provider:   Traci Turner, MD     Important Information About Sugar       

## 2022-05-25 NOTE — Progress Notes (Signed)
Virtual Visit via Video Note   This visit type was conducted due to national recommendations for restrictions regarding the COVID-19 Pandemic (e.g. social distancing) in an effort to limit this patient's exposure and mitigate transmission in our community.  Due to her co-morbid illnesses, this patient is at least at moderate risk for complications without adequate follow up.  This format is felt to be most appropriate for this patient at this time.  All issues noted in this document were discussed and addressed.  A limited physical exam was performed with this format.  Please refer to the patient's chart for her consent to telehealth for [facility].     Date:  05/25/2022   ID:  [name], DOB 1950/12/01, MRN [id number] The patient was identified using 2 identifiers.  Patient Location: Home Provider Location: Home Office   PCP:  Lorrene Reid, PA-C   [place] [facility] Cardiologist:  Fransico Him, MD     Evaluation Performed:  Follow-Up Visit  Chief Complaint:  CHF, OSA  History of Present Illness:    [name] is a 71 y.o. female with a hx of Chronic diastolic CHF, normal coronary arteries with normal filling pressures in 07/2019, chronic DOE related to obesity and sedentary state (evaluated in AHF clinic and felt that sx were related to this and now so much CHF), morbid obesity and OSA.  She had a coronary Ca score in 4/23 that was 813 and   When I saw her last January she had stopped using her CPAP there year before because she was traveling a lot and was not have any sleepiness.  At that time I explained the relationship between her CHF and OSA and she agreed to a home sleep study to reassess her OSA.  Her HST showed moderate OSA with an AHI of 15/hr and she underwent CPAP titration and was started on auto CPAP from 4 to 15cm H2O.     She is here today for followup and is doing well.  She denies any chest pain or pressure, SOB, DOE, PND, orthopnea, dizziness,  palpitations or syncope. She has chronic LE edema which is stable. She is compliant with her meds and is tolerating meds with no SE.    She is doing well with her PAP device and thinks that she has gotten used to it.  She tolerates the full face mask and feels the pressure is adequate.  Since going on PAP she feels rested in the am and has no significant daytime sleepiness.  She denies any nasal dryness or nasal congestion.  She does not think that he snores.  She has Sjogrens syndrome so has chronic dry mouth.   The patient does not have symptoms concerning for COVID-19 infection (fever, chills, cough, or new shortness of breath).    Past Medical History:  Diagnosis Date   Arthritis    Rheumatoid arthritis,    Back pain    Blood transfusion    1981   CHF (congestive heart failure) (HCC)    Chronic diastolic CHF (congestive heart failure) (HCC)    diastolic    DDD (degenerative disc disease), cervical    DDD (degenerative disc disease), lumbar    Edema of both lower extremities    Fibromyalgia    Gallbladder problem    GERD (gastroesophageal reflux disease)    Heart murmur    as a child   Hiatal hernia    sjorgens syndrome   High blood pressure  High cholesterol    IBS (irritable bowel syndrome)    Joint pain    Numbness and tingling of both feet    Numbness of fingers    OSA (obstructive sleep apnea)    Osteoarthritis    Peripheral autonomic neuropathy of unknown cause    Pneumonia 07/2018   Polyneuropathy    Pre-diabetes    PVC (premature ventricular contraction)    Raynaud disease    Rheumatoid arthritis ([place])    Sjogren's disease ([place])    Sleep apnea    SOB (shortness of breath)    Swallowing difficulty    Urticaria    Vitamin D deficiency    Past Surgical History:  Procedure Laterality Date   ABDOMINAL HYSTERECTOMY     BTL, BSO   ADENOIDECTOMY     BREAST EXCISIONAL BIOPSY Left    BREAST SURGERY     mass removal    CHOLECYSTECTOMY     dental implants      DIAGNOSTIC LAPAROSCOPY     x3   KNEE ARTHROSCOPY     x2   MASS EXCISION Left 03/22/2017   Procedure: EXCISION OF LEFT BREAST MASS;  Surgeon: Donnie Mesa, MD;  Location: [facility];  Service: General;  Laterality: Left;   patotid cystectomy     RIGHT/LEFT HEART CATH AND CORONARY ANGIOGRAPHY N/A 07/25/2019   Procedure: RIGHT/LEFT HEART CATH AND CORONARY ANGIOGRAPHY;  Surgeon: Jolaine Artist, MD;  Location: [facility];  Service: Cardiovascular;  Laterality: N/A;   TENDON REPAIR  1980   left ankle and tibia   TONSILLECTOMY     TOTAL KNEE ARTHROPLASTY  06/21/2011   Procedure: TOTAL KNEE ARTHROPLASTY;  Surgeon: Gearlean Alf;  Location: [facility];  Service: Orthopedics;  Laterality: Right;   TUBAL LIGATION       Current Meds  Medication Sig   amiodarone (PACERONE) 200 MG tablet Take 1 tablet (200 mg total) by mouth daily.   aspirin EC 81 MG tablet Take 81 mg by mouth at bedtime.   cetirizine (ZYRTEC) 10 MG tablet Take 1 tablet (10 mg total) by mouth daily.   colestipol (COLESTID) 1 g tablet Take 2 g by mouth 2 (two) times daily.   cycloSPORINE (RESTASIS) 0.05 % ophthalmic emulsion Place 2 drops into both eyes 2 (two) times daily.   diphenoxylate-atropine (LOMOTIL) 2.5-0.025 MG per tablet Take 1 tablet by mouth 4 (four) times daily as needed for diarrhea or loose stools.   doxylamine, Sleep, (UNISOM) 25 MG tablet Take 1 tablet (25 mg total) by mouth at bedtime as needed.   fenofibrate (TRICOR) 48 MG tablet Take 1 tablet (48 mg total) by mouth daily.   fluticasone (FLONASE) 50 MCG/ACT nasal spray Place 1 spray into both nostrils daily. (Patient taking differently: Place 1 spray into both nostrils as needed.)   gabapentin (NEURONTIN) 300 MG capsule TAKE 2 CAPSULES BY MOUTH 2 TIMES DAILY.   hydroxychloroquine (PLAQUENIL) 200 MG tablet Take 1 tablet (200 mg total) by mouth daily.   hyoscyamine (ANASPAZ) 0.125 MG TBDP disintergrating tablet Place 0.25 mg under the tongue every 6 (six)  hours as needed for cramping.    leflunomide (ARAVA) 20 MG tablet TAKE 1 TABLET (20 MG TOTAL) BY MOUTH DAILY.   lidocaine (LIDODERM) 5 % PLACE 1 PATCH ONTO THE SKIN DAILY AS NEEDED FOR PAIN. REMOVE AND DISCARD PATCH WITHIN 12 HOURS OR AS DIRECTED BY MD.   metoprolol succinate (TOPROL-XL) 25 MG 24 hr tablet Take 0.5 tablets (12.5 mg total)  by mouth daily.   NONFORMULARY OR COMPOUNDED ITEM Triamcinolone 0.1% & Silvadene cream 1:1- Apply as directed to affected areas as needed   omeprazole (PRILOSEC) 40 MG capsule Take 40 mg by mouth as needed.   potassium chloride (KLOR-CON) 10 MEQ tablet Take 2 tablets (20 mEq total) by mouth daily.   Probiotic Product (PROBIOTIC DAILY PO) Take by mouth. Ultraflora IB Probiotic   promethazine (PHENERGAN) 25 MG tablet TAKE 1 TABLET BY MOUTH EVERY 6 HOURS AS NEEDED.   rosuvastatin (CRESTOR) 10 MG tablet Take 1 tablet (10 mg total) by mouth daily.   Semaglutide, 1 MG/DOSE, 4 MG/3ML SOPN Inject 1 mg as directed once a week.   spironolactone (ALDACTONE) 25 MG tablet Take 1 tablet (25 mg total) by mouth daily. (Patient taking differently: Take 12.5 mg by mouth daily.)   torsemide 40 MG TABS Take 40 mg by mouth daily.   UNABLE TO FIND CPAP: AT bedtime; setting is "12"   [DISCONTINUED] cholestyramine (QUESTRAN) 4 GM/DOSE powder Take by mouth 3 (three) times daily with meals. As needed     Allergies:   Sulfa antibiotics, Cymbalta [duloxetine hcl], Demerol, Ivp dye [iodinated contrast media], Morphine and related, Sulfasalazine, and Adhesive [tape]   Social History   Tobacco Use   Smoking status: Never    Passive exposure: Past   Smokeless tobacco: Never  Vaping Use   Vaping Use: Never used  Substance Use Topics   Alcohol use: Yes    Comment: socially    Drug use: No     Family Hx: The patient's family history includes Alzheimer's disease in her mother; Asthma in her son; Breast cancer in her maternal aunt; Cancer in her father; Depression in her mother;  Glaucoma in her brother, brother, and mother; Heart attack in her brother and mother; Heart disease in her brother, father, and mother; High Cholesterol in her father and mother; Hyperlipidemia in her brother; Hypertension in her mother; Sleep apnea in her father. There is no history of Allergic rhinitis, Angioedema, Eczema, Urticaria, or Neuropathy.  ROS:   Please see the history of present illness.     All other systems reviewed and are negative.   Prior CV studies:   The following studies were reviewed today:  none  Labs/Other Tests and Data Reviewed:    EKG:  No ECG reviewed.  Recent Labs: 11/18/2021: TSH 0.883 01/19/2022: Hemoglobin 13.6; Platelets 201 03/27/2022: ALT 14; BUN 35; Creatinine, Ser 1.40; Potassium 4.4; Sodium 140   Recent Lipid Panel Lab Results  Component Value Date/Time   CHOL 157 03/27/2022 02:17 PM   TRIG 145 03/27/2022 02:17 PM   HDL 68 03/27/2022 02:17 PM   CHOLHDL 2.3 03/27/2022 02:17 PM   CHOLHDL 4.7 08/11/2016 10:17 AM   LDLCALC 64 03/27/2022 02:17 PM    Wt Readings from Last 3 Encounters:  05/25/22 195 lb (88.5 kg)  05/08/22 198 lb (89.8 kg)  03/31/22 204 lb 1.6 oz (92.6 kg)     Risk Assessment/Calculations:          Objective:    Vital Signs:  BP 112/68   Pulse 68   Ht '5\' 1"'$  (1.549 m)   Wt 195 lb (88.5 kg)   SpO2 96%   BMI 36.84 kg/m   Well nourished, well developed female in no acute distress. Well appearing, alert and conversant, regular work of breathing,  good skin color  Eyes- anicteric mouth- oral mucosa is pink  neuro- grossly intact skin- no apparent rash or lesions or  cyanosis   ASSESSMENT & PLAN:    OSA - The patient is tolerating PAP therapy well without any problems. The PAP download performed by his DME was personally reviewed and interpreted by me today and showed an AHI of 3.3/hr on auto CPAP from 4-15 cm H2O with 83% compliance in using more than 4 hours nightly.  The patient has been using and benefiting from PAP  use and will continue to benefit from therapy.   Chronic diastolic CHF -evaluated in AHF clinic and felt that chronic SOB related to morbid obesity and sedentary state and recommended a goal of weight loss as her high school weight + 15lbs for her dry weight.   -normal coronary arteries and filling pressures on cath 07/2019 -Weights seem a little bit off as she weighed 192 pounds on 08/23/2021 and now says she weighs 200 pounds.  Her weight on 07/05/2021 was 190 pounds as well. -SOB seems to be stable -continue prescription drug management with Torsemide '40mg'$  daily and spiro '25mg'$  daily with PRN refills -she has CKD followed by nephrology   HTN -BP adequately controlled -Continue prescription drug management with Toprol XL 12.'5mg'$  daily, spiro 12.'5mg'$  daily with PRN refills  PVCs -PVCs well controlled with Toprol 12.'5mg'$  daily and Amio '200mg'$  daily -followed by Dr. Lovena Le with EP and was started on low dose Amio and has really been able to tell that they have reduced in frequency -continue low dose BB>>her nephrologist does not want to push the BB due to her underlying renal disease  Coronary artery calcifications -her coronary Ca score was 856 in 2023 -she is completely asymptomatic  and I have instructed her to call if she develops chest pain or pressure, exertional fatigue or DOE but currently no indication for stress testing -needs aggressive risk factor modification -I have personally reviewed and interpreted outside labs performed by patient's PCP which showed HbA1C 5.3% -lipids at goal -continue ASA '81mg'$  daily, BB and statin  HLD -LDL goal < 70 -followed in lipid clinic -I have personally reviewed and interpreted outside labs performed by patient's PCP which showed LDL 64, HDL 68, TAGS 145 -continue prescription drug management with Crestor '10mg'$  daily, Fenofibrate '48mg'$  daily and Questran with PRN refills    COVID-19 Education: The signs and symptoms of COVID-19 were discussed  with the patient and how to seek care for testing (follow up with PCP or arrange E-visit).  The importance of social distancing was discussed today.  Time:   Today, I have spent 20 minutes with the patient with telehealth technology discussing the above problems.     Medication Adjustments/Labs and Tests Ordered: Current medicines are reviewed at length with the patient today.  Concerns regarding medicines are outlined above.   Tests Ordered: No orders of the defined types were placed in this encounter.   Medication Changes: No orders of the defined types were placed in this encounter.   Follow Up:  In Person in 1 year(s)  Signed, Fransico Him, MD  05/25/2022 10:33 AM    [place]

## 2022-05-27 DIAGNOSIS — R269 Unspecified abnormalities of gait and mobility: Secondary | ICD-10-CM | POA: Diagnosis not present

## 2022-05-27 DIAGNOSIS — G4733 Obstructive sleep apnea (adult) (pediatric): Secondary | ICD-10-CM | POA: Diagnosis not present

## 2022-05-31 DIAGNOSIS — G4733 Obstructive sleep apnea (adult) (pediatric): Secondary | ICD-10-CM | POA: Diagnosis not present

## 2022-06-12 DIAGNOSIS — N1831 Chronic kidney disease, stage 3a: Secondary | ICD-10-CM | POA: Diagnosis not present

## 2022-06-21 ENCOUNTER — Encounter (HOSPITAL_COMMUNITY): Payer: Self-pay | Admitting: Internal Medicine

## 2022-06-21 ENCOUNTER — Emergency Department (HOSPITAL_COMMUNITY): Payer: PPO

## 2022-06-21 ENCOUNTER — Inpatient Hospital Stay (HOSPITAL_COMMUNITY): Payer: PPO

## 2022-06-21 ENCOUNTER — Inpatient Hospital Stay (HOSPITAL_COMMUNITY)
Admission: EM | Admit: 2022-06-21 | Discharge: 2022-06-28 | DRG: 480 | Disposition: A | Discharge status: Skilled Nursing Facility | Payer: PPO | Attending: Internal Medicine | Admitting: Internal Medicine

## 2022-06-21 DIAGNOSIS — D62 Acute posthemorrhagic anemia: Secondary | ICD-10-CM | POA: Diagnosis not present

## 2022-06-21 DIAGNOSIS — I493 Ventricular premature depolarization: Secondary | ICD-10-CM | POA: Diagnosis present

## 2022-06-21 DIAGNOSIS — R0902 Hypoxemia: Secondary | ICD-10-CM | POA: Diagnosis not present

## 2022-06-21 DIAGNOSIS — Z66 Do not resuscitate: Secondary | ICD-10-CM | POA: Diagnosis not present

## 2022-06-21 DIAGNOSIS — S82831A Other fracture of upper and lower end of right fibula, initial encounter for closed fracture: Secondary | ICD-10-CM | POA: Diagnosis not present

## 2022-06-21 DIAGNOSIS — M3500 Sicca syndrome, unspecified: Secondary | ICD-10-CM | POA: Diagnosis not present

## 2022-06-21 DIAGNOSIS — S72021A Displaced fracture of epiphysis (separation) (upper) of right femur, initial encounter for closed fracture: Secondary | ICD-10-CM | POA: Diagnosis not present

## 2022-06-21 DIAGNOSIS — M797 Fibromyalgia: Secondary | ICD-10-CM | POA: Diagnosis not present

## 2022-06-21 DIAGNOSIS — R600 Localized edema: Secondary | ICD-10-CM | POA: Diagnosis not present

## 2022-06-21 DIAGNOSIS — K529 Noninfective gastroenteritis and colitis, unspecified: Secondary | ICD-10-CM | POA: Diagnosis not present

## 2022-06-21 DIAGNOSIS — R8289 Other abnormal findings on cytological and histological examination of urine: Secondary | ICD-10-CM | POA: Diagnosis not present

## 2022-06-21 DIAGNOSIS — S7291XD Unspecified fracture of right femur, subsequent encounter for closed fracture with routine healing: Secondary | ICD-10-CM | POA: Diagnosis present

## 2022-06-21 DIAGNOSIS — I130 Hypertensive heart and chronic kidney disease with heart failure and stage 1 through stage 4 chronic kidney disease, or unspecified chronic kidney disease: Secondary | ICD-10-CM | POA: Diagnosis not present

## 2022-06-21 DIAGNOSIS — M545 Low back pain, unspecified: Secondary | ICD-10-CM | POA: Diagnosis not present

## 2022-06-21 DIAGNOSIS — E785 Hyperlipidemia, unspecified: Secondary | ICD-10-CM | POA: Diagnosis not present

## 2022-06-21 DIAGNOSIS — Z96651 Presence of right artificial knee joint: Secondary | ICD-10-CM | POA: Diagnosis not present

## 2022-06-21 DIAGNOSIS — I509 Heart failure, unspecified: Secondary | ICD-10-CM | POA: Diagnosis not present

## 2022-06-21 DIAGNOSIS — Z888 Allergy status to other drugs, medicaments and biological substances status: Secondary | ICD-10-CM

## 2022-06-21 DIAGNOSIS — G4733 Obstructive sleep apnea (adult) (pediatric): Secondary | ICD-10-CM | POA: Diagnosis present

## 2022-06-21 DIAGNOSIS — S72461A Displaced supracondylar fracture with intracondylar extension of lower end of right femur, initial encounter for closed fracture: Secondary | ICD-10-CM | POA: Diagnosis not present

## 2022-06-21 DIAGNOSIS — M25561 Pain in right knee: Secondary | ICD-10-CM | POA: Diagnosis not present

## 2022-06-21 DIAGNOSIS — N1832 Chronic kidney disease, stage 3b: Secondary | ICD-10-CM | POA: Diagnosis present

## 2022-06-21 DIAGNOSIS — M6281 Muscle weakness (generalized): Secondary | ICD-10-CM | POA: Diagnosis not present

## 2022-06-21 DIAGNOSIS — Z4789 Encounter for other orthopedic aftercare: Secondary | ICD-10-CM | POA: Diagnosis not present

## 2022-06-21 DIAGNOSIS — Z79899 Other long term (current) drug therapy: Secondary | ICD-10-CM | POA: Diagnosis not present

## 2022-06-21 DIAGNOSIS — Z6841 Body Mass Index (BMI) 40.0 and over, adult: Secondary | ICD-10-CM | POA: Diagnosis not present

## 2022-06-21 DIAGNOSIS — Z882 Allergy status to sulfonamides status: Secondary | ICD-10-CM

## 2022-06-21 DIAGNOSIS — Z885 Allergy status to narcotic agent status: Secondary | ICD-10-CM

## 2022-06-21 DIAGNOSIS — E559 Vitamin D deficiency, unspecified: Secondary | ICD-10-CM | POA: Diagnosis not present

## 2022-06-21 DIAGNOSIS — E7800 Pure hypercholesterolemia, unspecified: Secondary | ICD-10-CM | POA: Diagnosis not present

## 2022-06-21 DIAGNOSIS — S72491D Other fracture of lower end of right femur, subsequent encounter for closed fracture with routine healing: Secondary | ICD-10-CM | POA: Diagnosis not present

## 2022-06-21 DIAGNOSIS — Z91048 Other nonmedicinal substance allergy status: Secondary | ICD-10-CM

## 2022-06-21 DIAGNOSIS — Z7982 Long term (current) use of aspirin: Secondary | ICD-10-CM

## 2022-06-21 DIAGNOSIS — E6609 Other obesity due to excess calories: Secondary | ICD-10-CM

## 2022-06-21 DIAGNOSIS — S72451A Displaced supracondylar fracture without intracondylar extension of lower end of right femur, initial encounter for closed fracture: Secondary | ICD-10-CM | POA: Diagnosis not present

## 2022-06-21 DIAGNOSIS — I110 Hypertensive heart disease with heart failure: Secondary | ICD-10-CM | POA: Diagnosis not present

## 2022-06-21 DIAGNOSIS — K219 Gastro-esophageal reflux disease without esophagitis: Secondary | ICD-10-CM | POA: Diagnosis not present

## 2022-06-21 DIAGNOSIS — R278 Other lack of coordination: Secondary | ICD-10-CM | POA: Diagnosis not present

## 2022-06-21 DIAGNOSIS — M8588 Other specified disorders of bone density and structure, other site: Secondary | ICD-10-CM | POA: Diagnosis not present

## 2022-06-21 DIAGNOSIS — Z8249 Family history of ischemic heart disease and other diseases of the circulatory system: Secondary | ICD-10-CM

## 2022-06-21 DIAGNOSIS — E119 Type 2 diabetes mellitus without complications: Secondary | ICD-10-CM | POA: Diagnosis not present

## 2022-06-21 DIAGNOSIS — I2510 Atherosclerotic heart disease of native coronary artery without angina pectoris: Secondary | ICD-10-CM | POA: Diagnosis not present

## 2022-06-21 DIAGNOSIS — S72401A Unspecified fracture of lower end of right femur, initial encounter for closed fracture: Secondary | ICD-10-CM

## 2022-06-21 DIAGNOSIS — I959 Hypotension, unspecified: Secondary | ICD-10-CM | POA: Diagnosis not present

## 2022-06-21 DIAGNOSIS — Z7401 Bed confinement status: Secondary | ICD-10-CM | POA: Diagnosis not present

## 2022-06-21 DIAGNOSIS — R931 Abnormal findings on diagnostic imaging of heart and coronary circulation: Secondary | ICD-10-CM | POA: Diagnosis not present

## 2022-06-21 DIAGNOSIS — Z6836 Body mass index (BMI) 36.0-36.9, adult: Secondary | ICD-10-CM

## 2022-06-21 DIAGNOSIS — M069 Rheumatoid arthritis, unspecified: Secondary | ICD-10-CM | POA: Diagnosis not present

## 2022-06-21 DIAGNOSIS — S7290XA Unspecified fracture of unspecified femur, initial encounter for closed fracture: Secondary | ICD-10-CM | POA: Diagnosis present

## 2022-06-21 DIAGNOSIS — Z7985 Long-term (current) use of injectable non-insulin antidiabetic drugs: Secondary | ICD-10-CM

## 2022-06-21 DIAGNOSIS — R4182 Altered mental status, unspecified: Secondary | ICD-10-CM | POA: Diagnosis not present

## 2022-06-21 DIAGNOSIS — I7300 Raynaud's syndrome without gangrene: Secondary | ICD-10-CM | POA: Diagnosis present

## 2022-06-21 DIAGNOSIS — R7303 Prediabetes: Secondary | ICD-10-CM | POA: Diagnosis present

## 2022-06-21 DIAGNOSIS — M9711XA Periprosthetic fracture around internal prosthetic right knee joint, initial encounter: Secondary | ICD-10-CM | POA: Diagnosis not present

## 2022-06-21 DIAGNOSIS — G4730 Sleep apnea, unspecified: Secondary | ICD-10-CM | POA: Diagnosis not present

## 2022-06-21 DIAGNOSIS — Z91041 Radiographic dye allergy status: Secondary | ICD-10-CM

## 2022-06-21 DIAGNOSIS — R269 Unspecified abnormalities of gait and mobility: Secondary | ICD-10-CM | POA: Diagnosis not present

## 2022-06-21 DIAGNOSIS — W19XXXA Unspecified fall, initial encounter: Principal | ICD-10-CM

## 2022-06-21 DIAGNOSIS — M810 Age-related osteoporosis without current pathological fracture: Secondary | ICD-10-CM | POA: Diagnosis not present

## 2022-06-21 DIAGNOSIS — W010XXA Fall on same level from slipping, tripping and stumbling without subsequent striking against object, initial encounter: Secondary | ICD-10-CM | POA: Diagnosis present

## 2022-06-21 DIAGNOSIS — M898X9 Other specified disorders of bone, unspecified site: Secondary | ICD-10-CM | POA: Diagnosis present

## 2022-06-21 DIAGNOSIS — M47812 Spondylosis without myelopathy or radiculopathy, cervical region: Secondary | ICD-10-CM | POA: Diagnosis not present

## 2022-06-21 DIAGNOSIS — J189 Pneumonia, unspecified organism: Secondary | ICD-10-CM | POA: Diagnosis not present

## 2022-06-21 DIAGNOSIS — G629 Polyneuropathy, unspecified: Secondary | ICD-10-CM | POA: Diagnosis present

## 2022-06-21 DIAGNOSIS — I5032 Chronic diastolic (congestive) heart failure: Secondary | ICD-10-CM | POA: Diagnosis present

## 2022-06-21 DIAGNOSIS — S72401D Unspecified fracture of lower end of right femur, subsequent encounter for closed fracture with routine healing: Secondary | ICD-10-CM | POA: Diagnosis not present

## 2022-06-21 DIAGNOSIS — J329 Chronic sinusitis, unspecified: Secondary | ICD-10-CM | POA: Diagnosis not present

## 2022-06-21 DIAGNOSIS — R2689 Other abnormalities of gait and mobility: Secondary | ICD-10-CM | POA: Diagnosis not present

## 2022-06-21 DIAGNOSIS — I10 Essential (primary) hypertension: Secondary | ICD-10-CM | POA: Diagnosis not present

## 2022-06-21 DIAGNOSIS — Z83438 Family history of other disorder of lipoprotein metabolism and other lipidemia: Secondary | ICD-10-CM

## 2022-06-21 LAB — PROTIME-INR
INR: 1.1 (ref 0.8–1.2)
Prothrombin Time: 13.7 seconds (ref 11.4–15.2)

## 2022-06-21 LAB — CBC
HCT: 37.3 % (ref 36.0–46.0)
Hemoglobin: 12 g/dL (ref 12.0–15.0)
MCH: 28.5 pg (ref 26.0–34.0)
MCHC: 32.2 g/dL (ref 30.0–36.0)
MCV: 88.6 fL (ref 80.0–100.0)
Platelets: 207 10*3/uL (ref 150–400)
RBC: 4.21 MIL/uL (ref 3.87–5.11)
RDW: 14 % (ref 11.5–15.5)
WBC: 7.8 10*3/uL (ref 4.0–10.5)
nRBC: 0 % (ref 0.0–0.2)

## 2022-06-21 LAB — BASIC METABOLIC PANEL
Anion gap: 13 (ref 5–15)
BUN: 30 mg/dL — ABNORMAL HIGH (ref 8–23)
CO2: 25 mmol/L (ref 22–32)
Calcium: 8.8 mg/dL — ABNORMAL LOW (ref 8.9–10.3)
Chloride: 103 mmol/L (ref 98–111)
Creatinine, Ser: 1.51 mg/dL — ABNORMAL HIGH (ref 0.44–1.00)
GFR, Estimated: 37 mL/min — ABNORMAL LOW (ref >60)
Glucose, Bld: 94 mg/dL (ref 70–99)
Potassium: 3.6 mmol/L (ref 3.5–5.1)
Sodium: 141 mmol/L (ref 135–145)

## 2022-06-21 LAB — HIV ANTIBODY (ROUTINE TESTING W REFLEX): HIV Screen 4th Generation wRfx: NONREACTIVE

## 2022-06-21 LAB — TYPE AND SCREEN
ABO/RH(D): A POS
Antibody Screen: NEGATIVE

## 2022-06-21 LAB — APTT: aPTT: 26 seconds (ref 24–36)

## 2022-06-21 MED ORDER — ONDANSETRON HCL 4 MG/2ML IJ SOLN
4.0000 mg | Freq: Once | INTRAMUSCULAR | Status: AC
  Administered 2022-06-21: 4 mg via INTRAVENOUS
  Filled 2022-06-21: qty 2

## 2022-06-21 MED ORDER — ACETAMINOPHEN 325 MG PO TABS: 650.0000 mg | ORAL_TABLET | Freq: Four times a day (QID) | ORAL | Status: DC | PRN

## 2022-06-21 MED ORDER — DOCUSATE SODIUM 100 MG PO CAPS
100.0000 mg | ORAL_CAPSULE | Freq: Two times a day (BID) | ORAL | Status: DC
  Administered 2022-06-21: 100 mg via ORAL
  Filled 2022-06-21: qty 1

## 2022-06-21 MED ORDER — METOPROLOL SUCCINATE ER 25 MG PO TB24
12.5000 mg | ORAL_TABLET | Freq: Every day | ORAL | Status: DC
  Administered 2022-06-22 – 2022-06-28 (×6): 12.5 mg via ORAL
  Filled 2022-06-21 (×6): qty 1

## 2022-06-21 MED ORDER — OXYCODONE HCL 5 MG PO TABS
5.0000 mg | ORAL_TABLET | ORAL | Status: DC | PRN
  Administered 2022-06-21 – 2022-06-27 (×20): 10 mg via ORAL
  Administered 2022-06-27: 5 mg via ORAL
  Administered 2022-06-28 (×2): 10 mg via ORAL
  Filled 2022-06-21: qty 2
  Filled 2022-06-21: qty 1
  Filled 2022-06-21 (×21): qty 2

## 2022-06-21 MED ORDER — OXYCODONE HCL 5 MG PO TABS: 5.0000 mg | ORAL_TABLET | Freq: Once | ORAL | Status: DC

## 2022-06-21 MED ORDER — LORATADINE 10 MG PO TABS
10.0000 mg | ORAL_TABLET | Freq: Every day | ORAL | Status: DC
  Administered 2022-06-21 – 2022-06-27 (×7): 10 mg via ORAL
  Filled 2022-06-21 (×7): qty 1

## 2022-06-21 MED ORDER — ASPIRIN 81 MG PO TBEC
81.0000 mg | DELAYED_RELEASE_TABLET | Freq: Every day | ORAL | Status: DC
  Administered 2022-06-21 – 2022-06-27 (×7): 81 mg via ORAL
  Filled 2022-06-21 (×7): qty 1

## 2022-06-21 MED ORDER — DOXYLAMINE SUCCINATE (SLEEP) 25 MG PO TABS
25.0000 mg | ORAL_TABLET | Freq: Every day | ORAL | Status: DC
  Administered 2022-06-21 – 2022-06-27 (×7): 25 mg via ORAL
  Filled 2022-06-21 (×8): qty 1

## 2022-06-21 MED ORDER — LEFLUNOMIDE 10 MG PO TABS
20.0000 mg | ORAL_TABLET | Freq: Every day | ORAL | Status: DC
  Administered 2022-06-23 – 2022-06-28 (×6): 20 mg via ORAL
  Filled 2022-06-21 (×7): qty 2
  Filled 2022-06-21: qty 1

## 2022-06-21 MED ORDER — METHOCARBAMOL 500 MG PO TABS
500.0000 mg | ORAL_TABLET | Freq: Four times a day (QID) | ORAL | Status: DC | PRN
  Administered 2022-06-23 – 2022-06-28 (×15): 500 mg via ORAL
  Filled 2022-06-21 (×15): qty 1

## 2022-06-21 MED ORDER — HYDROMORPHONE HCL 1 MG/ML IJ SOLN
0.5000 mg | INTRAMUSCULAR | Status: DC | PRN
  Administered 2022-06-21 – 2022-06-26 (×4): 0.5 mg via INTRAVENOUS
  Filled 2022-06-21: qty 0.5
  Filled 2022-06-21: qty 1
  Filled 2022-06-21: qty 0.5
  Filled 2022-06-21 (×2): qty 1

## 2022-06-21 MED ORDER — FENOFIBRATE 54 MG PO TABS
54.0000 mg | ORAL_TABLET | Freq: Every day | ORAL | Status: DC
  Administered 2022-06-21 – 2022-06-27 (×7): 54 mg via ORAL
  Filled 2022-06-21 (×8): qty 1

## 2022-06-21 MED ORDER — CYCLOSPORINE 0.05 % OP EMUL: 2.0000 [drp] | Freq: Two times a day (BID) | OPHTHALMIC | Status: DC | PRN

## 2022-06-21 MED ORDER — METHOCARBAMOL 1000 MG/10ML IJ SOLN: 500.0000 mg | Freq: Four times a day (QID) | INTRAVENOUS | Status: DC | PRN

## 2022-06-21 MED ORDER — COLESTIPOL HCL 1 G PO TABS
2.0000 g | ORAL_TABLET | Freq: Every day | ORAL | Status: DC
  Administered 2022-06-21 – 2022-06-27 (×7): 2 g via ORAL
  Filled 2022-06-21 (×8): qty 2

## 2022-06-21 MED ORDER — ROSUVASTATIN CALCIUM 5 MG PO TABS
10.0000 mg | ORAL_TABLET | Freq: Every day | ORAL | Status: DC
  Administered 2022-06-23 – 2022-06-27 (×5): 10 mg via ORAL
  Filled 2022-06-21 (×5): qty 2

## 2022-06-21 MED ORDER — HYOSCYAMINE SULFATE 0.125 MG PO TBDP: 0.2500 mg | ORAL_TABLET | Freq: Four times a day (QID) | ORAL | Status: DC | PRN

## 2022-06-21 MED ORDER — POLYETHYLENE GLYCOL 3350 17 G PO PACK: 17.0000 g | PACK | Freq: Every day | ORAL | Status: DC | PRN

## 2022-06-21 MED ORDER — BISACODYL 5 MG PO TBEC: 5.0000 mg | DELAYED_RELEASE_TABLET | Freq: Every day | ORAL | Status: DC | PRN

## 2022-06-21 MED ORDER — HYDROMORPHONE HCL 1 MG/ML IJ SOLN
0.5000 mg | Freq: Once | INTRAMUSCULAR | Status: AC
  Administered 2022-06-21: 0.5 mg via INTRAVENOUS
  Filled 2022-06-21: qty 1

## 2022-06-21 MED ORDER — ONDANSETRON HCL 4 MG/2ML IJ SOLN: 4.0000 mg | Freq: Four times a day (QID) | INTRAMUSCULAR | Status: DC | PRN

## 2022-06-21 MED ORDER — AMIODARONE HCL 200 MG PO TABS
200.0000 mg | ORAL_TABLET | Freq: Every day | ORAL | Status: DC
  Administered 2022-06-23 – 2022-06-28 (×6): 200 mg via ORAL
  Filled 2022-06-21 (×6): qty 1

## 2022-06-21 MED ORDER — HYDROXYCHLOROQUINE SULFATE 200 MG PO TABS
200.0000 mg | ORAL_TABLET | ORAL | Status: DC
  Administered 2022-06-22 – 2022-06-28 (×5): 200 mg via ORAL
  Filled 2022-06-21 (×6): qty 1

## 2022-06-21 MED ORDER — GABAPENTIN 300 MG PO CAPS
600.0000 mg | ORAL_CAPSULE | Freq: Two times a day (BID) | ORAL | Status: DC
  Administered 2022-06-21 – 2022-06-28 (×13): 600 mg via ORAL
  Filled 2022-06-21 (×13): qty 2

## 2022-06-21 NOTE — ED Notes (Signed)
Pt assisted onto fracture pan to urinate. Pt voided 569m of urine.

## 2022-06-21 NOTE — ED Provider Notes (Signed)
[facility] EMERGENCY DEPARTMENT Provider Note   CSN: [id number] Arrival date & time: 06/21/22  1320     History  Chief Complaint  Patient presents with   [name]    [name] is a 71 y.o. female.  With PMH of CHF, HTN, HLD, OA s/p R TKA in 2012 who presents with right knee pain after mechanical fall.  Has been walking around her house trying to clean it out with her walker when the walker got caught on clothing or something on the floor causing her to fall to her right side with the knee flexed onto her right knee.  She had no pop or sound when falling onto her knee however she had significant pain in her knee and was unable to stand up after the fall.  She did not hit her head or lose consciousness.  She denies any preceding symptoms leading to the fall.  She is not on anticoagulation.  She says she has neuropathy at baseline but no new loss of sensation or change of sensation in her leg.  She has been having difficulties flexing the right knee.   Fall       Home Medications Prior to Admission medications   Medication Sig Start Date End Date Taking? Authorizing Provider  amiodarone (PACERONE) 200 MG tablet Take 1 tablet (200 mg total) by mouth daily. 11/18/21   Evans Lance, MD  aspirin EC 81 MG tablet Take 81 mg by mouth at bedtime.    [provider]  cetirizine (ZYRTEC) 10 MG tablet Take 1 tablet (10 mg total) by mouth daily. 02/07/22   Laqueta Linden, MD  colestipol (COLESTID) 1 g tablet Take 2 g by mouth 2 (two) times daily.    [provider]  cycloSPORINE (RESTASIS) 0.05 % ophthalmic emulsion Place 2 drops into both eyes 2 (two) times daily.    [provider]  diphenoxylate-atropine (LOMOTIL) 2.5-0.025 MG per tablet Take 1 tablet by mouth 4 (four) times daily as needed for diarrhea or loose stools.    [provider]  doxylamine, Sleep, (UNISOM) 25 MG tablet Take 1 tablet (25 mg total) by mouth at bedtime as  needed. 09/09/20   Laqueta Linden, MD  fenofibrate (TRICOR) 48 MG tablet Take 1 tablet (48 mg total) by mouth daily. 05/08/22   Laqueta Linden, MD  fluticasone (FLONASE) 50 MCG/ACT nasal spray Place 1 spray into both nostrils daily. Patient taking differently: Place 1 spray into both nostrils as needed. 06/14/21   Laqueta Linden, MD  gabapentin (NEURONTIN) 300 MG capsule TAKE 2 CAPSULES BY MOUTH 2 TIMES DAILY. 11/17/21   Ward Givens, NP  hydroxychloroquine (PLAQUENIL) 200 MG tablet Take 1 tablet (200 mg total) by mouth daily. 05/08/22   Bo Merino, MD  hyoscyamine (ANASPAZ) 0.125 MG TBDP disintergrating tablet Place 0.25 mg under the tongue every 6 (six) hours as needed for cramping.     [provider]  leflunomide (ARAVA) 20 MG tablet TAKE 1 TABLET (20 MG TOTAL) BY MOUTH DAILY. 05/03/22   Bo Merino, MD  lidocaine (LIDODERM) 5 % PLACE 1 PATCH ONTO THE SKIN DAILY AS NEEDED FOR PAIN. REMOVE AND DISCARD PATCH WITHIN 12 HOURS OR AS DIRECTED BY MD. 05/04/22   Ward Givens, NP  metoprolol succinate (TOPROL-XL) 25 MG 24 hr tablet Take 0.5 tablets (12.5 mg total) by mouth daily. 10/05/21   Sueanne Margarita, MD  NONFORMULARY OR COMPOUNDED ITEM Triamcinolone 0.1% & Silvadene cream 1:1-  Apply as directed to affected areas as needed    [provider]  omeprazole (PRILOSEC) 40 MG capsule Take 40 mg by mouth as needed. 10/05/21   [provider]  potassium chloride (KLOR-CON) 10 MEQ tablet Take 2 tablets (20 mEq total) by mouth daily. 05/04/21   Laqueta Linden, MD  Probiotic Product (PROBIOTIC DAILY PO) Take by mouth. Ultraflora IB Probiotic    [provider]  promethazine (PHENERGAN) 25 MG tablet TAKE 1 TABLET BY MOUTH EVERY 6 HOURS AS NEEDED. 03/01/22   Ofilia Neas, PA-C  rosuvastatin (CRESTOR) 10 MG tablet Take 1 tablet (10 mg total) by mouth daily. 11/21/21   Sueanne Margarita, MD  Semaglutide, 1 MG/DOSE, 4 MG/3ML SOPN Inject 1 mg as  directed once a week. 05/08/22   Laqueta Linden, MD  spironolactone (ALDACTONE) 25 MG tablet Take 1 tablet (25 mg total) by mouth daily. Patient taking differently: Take 12.5 mg by mouth daily. 11/03/21   Sueanne Margarita, MD  torsemide 40 MG TABS Take 40 mg by mouth daily. 01/12/21   Bensimhon, Shaune Pascal, MD  UNABLE TO FIND CPAP: AT bedtime; setting is "61"    [provider]      Allergies    Sulfa antibiotics, Cymbalta [duloxetine hcl], Demerol, Ivp dye [iodinated contrast media], Morphine and related, Sulfasalazine, and Adhesive [tape]    Review of Systems   Review of Systems  Physical Exam Updated Vital Signs BP (!) 109/57 (BP Location: Right Arm)   Pulse 71   Temp 97.8 F (36.6 C)   Resp 18   SpO2 95%  Physical Exam Constitutional: Alert and oriented. Uncomfortable but nontoxic Eyes: Conjunctivae are normal. ENT      Head: Normocephalic and atraumatic. Cardiovascular: S1, S2, palpable DP pulses, warm and well-perfused Respiratory: Normal respiratory effort Gastrointestinal: Soft Musculoskeletal: Normal range of motion in all extremities.      Right lower leg: Laying in extension, no pain with logroll, pain with flexion of right knee, no gross valgus or varus instability of right knee, ttp of right distal femur with swelling and tightness but no tight compartment, ttp right anterior knee with swelling, no ttp or deformity of right tib/fib, full ROM intact right foot, palpable DP/PT pulse, sensation intact distal right knee      Left lower leg: No tenderness or edema. Neurologic: Normal speech and language.  Skin: Skin is warm, dry and intact. No rash noted. Psychiatric: Mood and affect are normal. Speech and behavior are normal.  ED Results / Procedures / Treatments   Labs (all labs ordered are listed, but only abnormal results are displayed) Labs Reviewed  CBC  BASIC METABOLIC PANEL  PROTIME-INR  APTT  TYPE AND SCREEN    EKG None  Radiology DG Femur  Min 2 Views Right  Result Date: 06/21/2022 CLINICAL DATA:  Fall EXAM: RIGHT FEMUR 2 VIEWS COMPARISON:  06/21/2022 FINDINGS: Right hip negative. Right knee replacement. Supracondylar fracture distal right femur with a 1/2 shaft with of lateral displacement. Nearly 1 shaft with posterior displacement. No change from earlier today IMPRESSION: Displaced supracondylar fracture distal right femur. Right knee replacement. No other femur fracture. Electronically Signed   By: Franchot Gallo M.D.   On: 06/21/2022 15:01   DG Knee Complete 4 Views Right  Result Date: 06/21/2022 CLINICAL DATA:  Pt tripped and fell onto right side onto hardwood floor today - having pain around right knee area, hx of right knee replacement concern for dislocated TKA  EXAM: RIGHT KNEE - COMPLETE 4+ VIEW COMPARISON:  None Available. FINDINGS: Oblique fracture through the distal RIGHT femoral metaphysis just above the distal femoral prosthetic. 2 cm lateral displacement the distal fracture fragment. On lateral projection there is posterior angulation of the distal fracture fragment and override approximately 1.7 cm IMPRESSION: Acute oblique fracture through the distal femoral metaphysis above the prosthetic. Electronically Signed   By: Suzy Bouchard M.D.   On: 06/21/2022 14:54    Procedures Procedures    Medications Ordered in ED Medications  HYDROmorphone (DILAUDID) injection 0.5 mg (0.5 mg Intravenous Given 06/21/22 1358)  ondansetron (ZOFRAN) injection 4 mg (4 mg Intravenous Given 06/21/22 1357)    ED Course/ Medical Decision Making/ A&P Clinical Course as of 06/21/22 1534  Wed Jun 21, 2022  1505 PP femur fracture. MJ to see. [CC]  1505 HTN/HLD. No DM. No thinners. [CC]  1506 CHF/OSA [CC]    Clinical Course User Index [CC] Tretha Sciara, MD                           Medical Decision Making LATRESE CAROLAN is a 71 y.o. female.  With PMH of CHF, HTN, HLD, OA s/p R TKA in 2012 who presents with right knee pain after  mechanical fall.    No concern for syncope and no requirement for head imaging.  RLE neurovascularly intact, no concern for ischemic limb. Plain films obtained of right knee and right femur which I personally reviewed and showed periprosthetic femur fracture.  Ordered pre-op labs and consulted on call ortho Jaci Standard has evaluated patient and requests medical admission for plans for surgery tomorrow morning.  Amount and/or Complexity of Data Reviewed Labs: ordered. Radiology: ordered.  Risk Prescription drug management. Decision regarding hospitalization.    Final Clinical Impression(s) / ED Diagnoses Final diagnoses:  Fall, initial encounter  Closed fracture of distal end of right femur, unspecified fracture morphology, initial encounter [facility])    Rx / DC Orders ED Discharge Orders     None         Elgie Congo, MD 06/21/22 1534

## 2022-06-21 NOTE — ED Notes (Signed)
Pt assisted onto fracture pan to urinate. Pt voided 572m of urine.

## 2022-06-21 NOTE — ED Notes (Signed)
Pt assisted onto fracture pan to urinate. Pt voided 362m of urine.

## 2022-06-21 NOTE — H&P (Addendum)
History and Physical    Patient: [name] DZH:[id number] DOB: May 07, 1951 DOA: 06/21/2022 DOS: the patient was seen and examined on 06/21/2022 PCP: Lorrene Reid, PA-C  Patient coming from: Home - lives with daughter, son-in-law, grandchild; NOK: Daughter, Reginia Forts, [phone]   Chief Complaint: Fall  HPI: [name] is a 71 y.o. female with medical history significant of RA; chronic diastolic CHF; HTN; HLD; and OSA presenting with a fall.   She reports that they are in the process of cleaning out her house to downsize.  She was moving clothes and placed things on her rolling walker seat.  The clothes fell off and got caught in the wheel of the walker, causing her to trip.  She landed directly on her R side.    ER Course:  [place] today, periprosthetic distal femur fracture.  Surgery tomorrow.     Review of Systems: As mentioned in the history of present illness. All other systems reviewed and are negative. Past Medical History:  Diagnosis Date   Agatston coronary artery calcium score greater than 400 05/2022   coronary Ca score 856   Back pain    Blood transfusion    1981   Chronic diastolic CHF (congestive heart failure) (HCC)    diastolic    DDD (degenerative disc disease), cervical    DDD (degenerative disc disease), lumbar    Fibromyalgia    GERD (gastroesophageal reflux disease)    Heart murmur    as a child   Hiatal hernia    sjorgens syndrome   High blood pressure    High cholesterol    IBS (irritable bowel syndrome)    OSA (obstructive sleep apnea)    Osteoarthritis    Peripheral autonomic neuropathy of unknown cause    Pre-diabetes    PVC (premature ventricular contraction)    Raynaud disease    Rheumatoid arthritis (HCC)    Sjogren's disease (HCC)    Urticaria    Vitamin D deficiency    Past Surgical History:  Procedure Laterality Date   ABDOMINAL HYSTERECTOMY     BTL, BSO   ADENOIDECTOMY     BREAST EXCISIONAL BIOPSY Left    BREAST  SURGERY     mass removal    CHOLECYSTECTOMY     dental implants     DIAGNOSTIC LAPAROSCOPY     x3   KNEE ARTHROSCOPY     x2   MASS EXCISION Left 03/22/2017   Procedure: EXCISION OF LEFT BREAST MASS;  Surgeon: Donnie Mesa, MD;  Location: [facility];  Service: General;  Laterality: Left;   patotid cystectomy     RIGHT/LEFT HEART CATH AND CORONARY ANGIOGRAPHY N/A 07/25/2019   Procedure: RIGHT/LEFT HEART CATH AND CORONARY ANGIOGRAPHY;  Surgeon: Jolaine Artist, MD;  Location: [facility];  Service: Cardiovascular;  Laterality: N/A;   TENDON REPAIR  1980   left ankle and tibia   TONSILLECTOMY     TOTAL KNEE ARTHROPLASTY  06/21/2011   Procedure: TOTAL KNEE ARTHROPLASTY;  Surgeon: Gearlean Alf;  Location: [facility];  Service: Orthopedics;  Laterality: Right;   TUBAL LIGATION     Social History:  reports that she has never smoked. She has been exposed to tobacco smoke. She has never used smokeless tobacco. She reports current alcohol use. She reports that she does not use drugs.  Allergies  Allergen Reactions   Sulfa Antibiotics Hives and Itching    Flushing   Cymbalta [Duloxetine Hcl] Other (See Comments)  Manic reaction   Demerol Hives and Other (See Comments)    Fever    Ivp Dye [Iodinated Contrast Media] Hives and Other (See Comments)    Fever   Morphine And Related Other (See Comments)    ineffective   Sulfasalazine Hives and Other (See Comments)    Flushing   Adhesive [Tape] Rash    Family History  Problem Relation Age of Onset   Alzheimer's disease Mother    Heart attack Mother    Hypertension Mother    Glaucoma Mother    High Cholesterol Mother    Heart disease Mother    Depression Mother    Cancer Father    High Cholesterol Father    Heart disease Father    Sleep apnea Father    Breast cancer Maternal Aunt    Heart attack Brother    Heart disease Brother    Glaucoma Brother    Hyperlipidemia Brother    Glaucoma Brother    Asthma Son    Allergic  rhinitis Neg Hx    Angioedema Neg Hx    Eczema Neg Hx    Urticaria Neg Hx    Neuropathy Neg Hx     Prior to Admission medications   Medication Sig Start Date End Date Taking? Authorizing Provider  amiodarone (PACERONE) 200 MG tablet Take 1 tablet (200 mg total) by mouth daily. 11/18/21   Evans Lance, MD  aspirin EC 81 MG tablet Take 81 mg by mouth at bedtime.    [provider]  cetirizine (ZYRTEC) 10 MG tablet Take 1 tablet (10 mg total) by mouth daily. 02/07/22   Laqueta Linden, MD  colestipol (COLESTID) 1 g tablet Take 2 g by mouth 2 (two) times daily.    [provider]  cycloSPORINE (RESTASIS) 0.05 % ophthalmic emulsion Place 2 drops into both eyes 2 (two) times daily.    [provider]  diphenoxylate-atropine (LOMOTIL) 2.5-0.025 MG per tablet Take 1 tablet by mouth 4 (four) times daily as needed for diarrhea or loose stools.    [provider]  doxylamine, Sleep, (UNISOM) 25 MG tablet Take 1 tablet (25 mg total) by mouth at bedtime as needed. 09/09/20   Laqueta Linden, MD  fenofibrate (TRICOR) 48 MG tablet Take 1 tablet (48 mg total) by mouth daily. 05/08/22   Laqueta Linden, MD  fluticasone (FLONASE) 50 MCG/ACT nasal spray Place 1 spray into both nostrils daily. Patient taking differently: Place 1 spray into both nostrils as needed. 06/14/21   Laqueta Linden, MD  gabapentin (NEURONTIN) 300 MG capsule TAKE 2 CAPSULES BY MOUTH 2 TIMES DAILY. 11/17/21   Ward Givens, NP  hydroxychloroquine (PLAQUENIL) 200 MG tablet Take 1 tablet (200 mg total) by mouth daily. 05/08/22   Bo Merino, MD  hyoscyamine (ANASPAZ) 0.125 MG TBDP disintergrating tablet Place 0.25 mg under the tongue every 6 (six) hours as needed for cramping.     [provider]  leflunomide (ARAVA) 20 MG tablet TAKE 1 TABLET (20 MG TOTAL) BY MOUTH DAILY. 05/03/22   Bo Merino, MD  lidocaine (LIDODERM) 5 % PLACE 1 PATCH ONTO THE SKIN DAILY AS NEEDED  FOR PAIN. REMOVE AND DISCARD PATCH WITHIN 12 HOURS OR AS DIRECTED BY MD. 05/04/22   Ward Givens, NP  metoprolol succinate (TOPROL-XL) 25 MG 24 hr tablet Take 0.5 tablets (12.5 mg total) by mouth daily. 10/05/21   Sueanne Margarita, MD  NONFORMULARY OR COMPOUNDED ITEM Triamcinolone 0.1% & Silvadene cream 1:1-  Apply as directed to affected areas as needed    [provider]  omeprazole (PRILOSEC) 40 MG capsule Take 40 mg by mouth as needed. 10/05/21   [provider]  potassium chloride (KLOR-CON) 10 MEQ tablet Take 2 tablets (20 mEq total) by mouth daily. 05/04/21   Laqueta Linden, MD  Probiotic Product (PROBIOTIC DAILY PO) Take by mouth. Ultraflora IB Probiotic    [provider]  promethazine (PHENERGAN) 25 MG tablet TAKE 1 TABLET BY MOUTH EVERY 6 HOURS AS NEEDED. 03/01/22   Ofilia Neas, PA-C  rosuvastatin (CRESTOR) 10 MG tablet Take 1 tablet (10 mg total) by mouth daily. 11/21/21   Sueanne Margarita, MD  Semaglutide, 1 MG/DOSE, 4 MG/3ML SOPN Inject 1 mg as directed once a week. 05/08/22   Laqueta Linden, MD  spironolactone (ALDACTONE) 25 MG tablet Take 1 tablet (25 mg total) by mouth daily. Patient taking differently: Take 12.5 mg by mouth daily. 11/03/21   Sueanne Margarita, MD  torsemide 40 MG TABS Take 40 mg by mouth daily. 01/12/21   Bensimhon, Shaune Pascal, MD  UNABLE TO FIND CPAP: AT bedtime; setting is "44"    [provider]    Physical Exam: Vitals:   06/21/22 1321 06/21/22 1705  BP: (!) 109/57   Pulse: 71   Resp: 18   Temp: 97.8 F (36.6 C) 98 F (36.7 C)  SpO2: 95%    General:  Appears calm and comfortable and is in NAD; R leg pain with movement Eyes:  EOMI, normal lids, iris ENT:  grossly normal lips & tongue, mmm; artificial dentition Neck:  no LAD, masses or thyromegaly Cardiovascular:  RRR, no m/r/g. No LE edema.  Respiratory:   CTA bilaterally with no wheezes/rales/rhonchi.  Normal respiratory effort.   Abdomen:  soft, NT, ND Skin:   no rash or induration seen on limited exam Musculoskeletal:  R leg is in knee immobilizer, significant pain with movement Lower extremity:  No LE edema.  Limited foot exam with no ulcerations.  2+ distal pulses. Psychiatric:  grossly normal mood and affect, speech fluent and appropriate, A&O x 3 Neurologic:  CN 2-12 grossly intact, moves all extremities in coordinated fashion other than RLE   Radiological Exams on Admission: Independently reviewed - see discussion in A/P where applicable  DG Femur Min 2 Views Right  Result Date: 06/21/2022 CLINICAL DATA:  Fall EXAM: RIGHT FEMUR 2 VIEWS COMPARISON:  06/21/2022 FINDINGS: Right hip negative. Right knee replacement. Supracondylar fracture distal right femur with a 1/2 shaft with of lateral displacement. Nearly 1 shaft with posterior displacement. No change from earlier today IMPRESSION: Displaced supracondylar fracture distal right femur. Right knee replacement. No other femur fracture. Electronically Signed   By: Franchot Gallo M.D.   On: 06/21/2022 15:01   DG Knee Complete 4 Views Right  Result Date: 06/21/2022 CLINICAL DATA:  Pt tripped and fell onto right side onto hardwood floor today - having pain around right knee area, hx of right knee replacement concern for dislocated TKA EXAM: RIGHT KNEE - COMPLETE 4+ VIEW COMPARISON:  None Available. FINDINGS: Oblique fracture through the distal RIGHT femoral metaphysis just above the distal femoral prosthetic. 2 cm lateral displacement the distal fracture fragment. On lateral projection there is posterior angulation of the distal fracture fragment and override approximately 1.7 cm IMPRESSION: Acute oblique fracture through the distal femoral metaphysis above the prosthetic. Electronically Signed   By: Suzy Bouchard M.D.   On: 06/21/2022 14:54  EKG: pending   Labs on Admission: I have personally reviewed the available labs and imaging studies at the time of the admission.  Pertinent labs:   BUN  30/Creatinine 1.51/GFR 37 - stable Normal CBC INR 1.0   Assessment and Plan: Principal Problem:   Femur fracture (HCC) Active Problems:   OSA (obstructive sleep apnea)   Rheumatoid arthritis (HCC)   PVC's (premature ventricular contractions)   Class 2 obesity due to excess calories with body mass index (BMI) of 36.0 to 36.9 in adult   Chronic diastolic CHF (congestive heart failure) (HCC)   Chronic kidney disease, stage 3b (HCC)   DNR (do not resuscitate)    Periprosthetic distal femur fracture -Apparently mechanical fall resulting in periprosthetic distal femur fracture -Orthopedics consulted -NPO after midnight in anticipation of surgical repair tomorrow -SCDs overnight, start Lovenox post-operatively (or as per ortho) -Pain control with Tylenol, Robaxin, Oxycodone, and Dilaudid prn -TOC team consult for rehab placement -Will need PT consult post-operatively -Hip fracture order set utilized -TXA per orthopedics  Pre-operative stratification -Orthopedic/spinal surgery is associated with an intermediate (1-5%) cardiovascular risk for cardiac death and nonfatal MI -Her revised cardiac index gives a risk estimate of 3.9% -Because of this low risk, it is reasonable for her to go to the OR without additional evaluation  RA -Continue hydroxychloroquine, leflunomide -Continue gabapentin  Chronic diastolic CHF -Echo on 4/4 with preserved EF and grade 1 DD -Appears compensated -Continue ASA -Hold torsemide and spironolactone for now  Colitis -Continue Colestid, hyoscyamine  PVCs -Continue amiodarone, Toprol XL  HTN -Continue Toprol XL  HLD -Continue fenofibrate, rosuvastatin  OSA -Continue CPAP  Stage 3b CKD -Appears to be stable at this time -Attempt to avoid nephrotoxic medications -Recheck BMP in AM   Obesity -Prior BMI was 36.9; recheck weight requested -Weight loss should be encouraged -Hold Ozempic -Outpatient PCP/bariatric medicine/bariatric surgery  f/u encouraged   DNR -I have discussed code status with the patient and her daughter and  they are in agreement that the patient would not desire resuscitation and would prefer to die a natural death should that situation arise. -She will need a gold out of facility DNR form at the time of discharge     Advance Care Planning:   Code Status: DNR   Consults: Orthopedics; SW, Nutrition; will need PT post-operatively  DVT Prophylaxis: SCDs until approved for Lovenox by orthopedics  Family Communication: Daughter was present throughout evaluation  Severity of Illness: The appropriate patient status for this patient is INPATIENT. Inpatient status is judged to be reasonable and necessary in order to provide the required intensity of service to ensure the patient's safety. The patient's presenting symptoms, physical exam findings, and initial radiographic and laboratory data in the context of their chronic comorbidities is felt to place them at high risk for further clinical deterioration. Furthermore, it is not anticipated that the patient will be medically stable for discharge from the hospital within 2 midnights of admission.   * I certify that at the point of admission it is my clinical judgment that the patient will require inpatient hospital care spanning beyond 2 midnights from the point of admission due to high intensity of service, high risk for further deterioration and high frequency of surveillance required.*  Author: Karmen Bongo, MD 06/21/2022 6:19 PM  For on call review [email or web address].

## 2022-06-21 NOTE — Consult Note (Signed)
Reason for Consult:Right distal femur fx Referring Physician: Georgina Snell Time called: [id number] Time at bedside: [name] is an 71 y.o. female.  HPI: Estee was cleaning her house. She was ambulating down a hallway with some clothes on her RW when she tripped and fell onto her right side. She had immediate right knee pain and could not get up. She was brought to the ED where x-rays showed a right periprosthetic distal femur fx and orthopedic surgery was consulted. She lives at home (with her 2 daughters currently) and uses a RW to ambulate distances.  Past Medical History:  Diagnosis Date   Agatston coronary artery calcium score greater than 400 05/2022   coronary Ca score 856   Arthritis    Rheumatoid arthritis,    Back pain    Blood transfusion    1981   CHF (congestive heart failure) (HCC)    Chronic diastolic CHF (congestive heart failure) (HCC)    diastolic    DDD (degenerative disc disease), cervical    DDD (degenerative disc disease), lumbar    Edema of both lower extremities    Fibromyalgia    Gallbladder problem    GERD (gastroesophageal reflux disease)    Heart murmur    as a child   Hiatal hernia    sjorgens syndrome   High blood pressure    High cholesterol    IBS (irritable bowel syndrome)    Joint pain    Numbness and tingling of both feet    Numbness of fingers    OSA (obstructive sleep apnea)    Osteoarthritis    Peripheral autonomic neuropathy of unknown cause    Pneumonia 07/2018   Polyneuropathy    Pre-diabetes    PVC (premature ventricular contraction)    Raynaud disease    Rheumatoid arthritis ([place])    Sjogren's disease ([place])    Sleep apnea    SOB (shortness of breath)    Swallowing difficulty    Urticaria    Vitamin D deficiency     Past Surgical History:  Procedure Laterality Date   ABDOMINAL HYSTERECTOMY     BTL, BSO   ADENOIDECTOMY     BREAST EXCISIONAL BIOPSY Left    BREAST SURGERY     mass removal    CHOLECYSTECTOMY      dental implants     DIAGNOSTIC LAPAROSCOPY     x3   KNEE ARTHROSCOPY     x2   MASS EXCISION Left 03/22/2017   Procedure: EXCISION OF LEFT BREAST MASS;  Surgeon: Donnie Mesa, MD;  Location: [facility];  Service: General;  Laterality: Left;   patotid cystectomy     RIGHT/LEFT HEART CATH AND CORONARY ANGIOGRAPHY N/A 07/25/2019   Procedure: RIGHT/LEFT HEART CATH AND CORONARY ANGIOGRAPHY;  Surgeon: Jolaine Artist, MD;  Location: [facility];  Service: Cardiovascular;  Laterality: N/A;   TENDON REPAIR  1980   left ankle and tibia   TONSILLECTOMY     TOTAL KNEE ARTHROPLASTY  06/21/2011   Procedure: TOTAL KNEE ARTHROPLASTY;  Surgeon: Gearlean Alf;  Location: [facility];  Service: Orthopedics;  Laterality: Right;   TUBAL LIGATION      Family History  Problem Relation Age of Onset   Alzheimer's disease Mother    Heart attack Mother    Hypertension Mother    Glaucoma Mother    High Cholesterol Mother    Heart disease Mother    Depression Mother    Cancer Father  High Cholesterol Father    Heart disease Father    Sleep apnea Father    Breast cancer Maternal Aunt    Heart attack Brother    Heart disease Brother    Glaucoma Brother    Hyperlipidemia Brother    Glaucoma Brother    Asthma Son    Allergic rhinitis Neg Hx    Angioedema Neg Hx    Eczema Neg Hx    Urticaria Neg Hx    Neuropathy Neg Hx     Social History:  reports that she has never smoked. She has been exposed to tobacco smoke. She has never used smokeless tobacco. She reports current alcohol use. She reports that she does not use drugs.  Allergies:  Allergies  Allergen Reactions   Sulfa Antibiotics Hives and Itching    Flushing also   Cymbalta [Duloxetine Hcl] Other (See Comments)    Caused a manic reaction   Demerol Hives and Other (See Comments)    Fever also   Ivp Dye [Iodinated Contrast Media] Hives and Other (See Comments)    Fever also   Morphine And Related Other (See Comments)     ineffective   Sulfasalazine Other (See Comments)    Reaction ??   Adhesive [Tape] Rash    Medications: I have reviewed the patient's current medications.  No results found for this or any previous visit (from the past 48 hour(s)).  DG Femur Min 2 Views Right  Result Date: 06/21/2022 CLINICAL DATA:  Fall EXAM: RIGHT FEMUR 2 VIEWS COMPARISON:  06/21/2022 FINDINGS: Right hip negative. Right knee replacement. Supracondylar fracture distal right femur with a 1/2 shaft with of lateral displacement. Nearly 1 shaft with posterior displacement. No change from earlier today IMPRESSION: Displaced supracondylar fracture distal right femur. Right knee replacement. No other femur fracture. Electronically Signed   By: Franchot Gallo M.D.   On: 06/21/2022 15:01   DG Knee Complete 4 Views Right  Result Date: 06/21/2022 CLINICAL DATA:  Pt tripped and fell onto right side onto hardwood floor today - having pain around right knee area, hx of right knee replacement concern for dislocated TKA EXAM: RIGHT KNEE - COMPLETE 4+ VIEW COMPARISON:  None Available. FINDINGS: Oblique fracture through the distal RIGHT femoral metaphysis just above the distal femoral prosthetic. 2 cm lateral displacement the distal fracture fragment. On lateral projection there is posterior angulation of the distal fracture fragment and override approximately 1.7 cm IMPRESSION: Acute oblique fracture through the distal femoral metaphysis above the prosthetic. Electronically Signed   By: Suzy Bouchard M.D.   On: 06/21/2022 14:54    Review of Systems  HENT:  Negative for ear discharge, ear pain, hearing loss and tinnitus.   Eyes:  Negative for photophobia and pain.  Respiratory:  Negative for cough and shortness of breath.   Cardiovascular:  Negative for chest pain.  Gastrointestinal:  Negative for abdominal pain, nausea and vomiting.  Genitourinary:  Negative for dysuria, flank pain, frequency and urgency.  Musculoskeletal:  Positive for  arthralgias (Right knee). Negative for back pain, myalgias and neck pain.  Neurological:  Negative for dizziness and headaches.  Hematological:  Does not bruise/bleed easily.  Psychiatric/Behavioral:  The patient is not nervous/anxious.    Blood pressure (!) 109/57, pulse 71, temperature 97.8 F (36.6 C), resp. rate 18, SpO2 95 %. Physical Exam Constitutional:      General: She is not in acute distress.    Appearance: She is well-developed. She is not diaphoretic.  HENT:  Head: Normocephalic and atraumatic.  Eyes:     General: No scleral icterus.       Right eye: No discharge.        Left eye: No discharge.     Conjunctiva/sclera: Conjunctivae normal.  Cardiovascular:     Rate and Rhythm: Normal rate and regular rhythm.  Pulmonary:     Effort: Pulmonary effort is normal. No respiratory distress.  Musculoskeletal:     Cervical back: Normal range of motion.     Comments: RLE No traumatic wounds, ecchymosis, or rash  Mod TTP knee  No ankle effusion  Sens DPN, SPN, TN intact  Motor EHL, ext, flex, evers 5/5  DP 2+, PT 2+, 2+ pitting edema  Skin:    General: Skin is warm and dry.  Neurological:     Mental Status: She is alert.  Psychiatric:        Mood and Affect: Mood normal.        Behavior: Behavior normal.     Assessment/Plan: Right distal femur fx -- plan ORIF tomorrow with Dr. Marcelino Scot. Please keep NPO after MN. Multiple medical problems including CHF, HTN, HLD, and unspecified dysrhythmias -- per primary service. May need cardiac clearance prior to OR.    Lisette Abu, PA-C Orthopedic Surgery [phone] 06/21/2022, 3:13 PM

## 2022-06-21 NOTE — Progress Notes (Signed)
Orthopedic Tech Progress Note Patient Details:  [name] May 19, 1951 [id number]  Ortho Devices Type of Ortho Device: Knee Immobilizer Ortho Device/Splint Location: RLE Ortho Device/Splint Interventions: Ordered, Application, Adjustment   Post Interventions Patient Tolerated: Well Instructions Provided: Care of device, Adjustment of device  [name] 06/21/2022, 7:25 PM

## 2022-06-21 NOTE — ED Notes (Signed)
Ortho tech at bedside to apply knee immobilizer.

## 2022-06-21 NOTE — ED Triage Notes (Signed)
Pt BIB [facility] for tripping and falling. Pt reports hx or R knee replacement and tripped and fell this morning. Deformity to right knee noted. DP/PT pulses present. Pt right leg splinted. Pt denies LOC or hitting head.

## 2022-06-22 ENCOUNTER — Other Ambulatory Visit: Payer: Self-pay

## 2022-06-22 ENCOUNTER — Inpatient Hospital Stay (HOSPITAL_COMMUNITY): Payer: PPO | Admitting: Anesthesiology

## 2022-06-22 ENCOUNTER — Inpatient Hospital Stay (HOSPITAL_COMMUNITY): Payer: PPO

## 2022-06-22 ENCOUNTER — Encounter (HOSPITAL_COMMUNITY)
Admission: EM | Disposition: A | Discharge status: Skilled Nursing Facility | Payer: Self-pay | Source: Home / Self Care | Attending: Internal Medicine

## 2022-06-22 ENCOUNTER — Encounter (HOSPITAL_COMMUNITY): Payer: Self-pay | Admitting: Internal Medicine

## 2022-06-22 DIAGNOSIS — G4733 Obstructive sleep apnea (adult) (pediatric): Secondary | ICD-10-CM | POA: Diagnosis not present

## 2022-06-22 DIAGNOSIS — I5032 Chronic diastolic (congestive) heart failure: Secondary | ICD-10-CM

## 2022-06-22 DIAGNOSIS — I509 Heart failure, unspecified: Secondary | ICD-10-CM | POA: Diagnosis not present

## 2022-06-22 DIAGNOSIS — S72401A Unspecified fracture of lower end of right femur, initial encounter for closed fracture: Secondary | ICD-10-CM

## 2022-06-22 DIAGNOSIS — I2510 Atherosclerotic heart disease of native coronary artery without angina pectoris: Secondary | ICD-10-CM | POA: Diagnosis not present

## 2022-06-22 DIAGNOSIS — N1832 Chronic kidney disease, stage 3b: Secondary | ICD-10-CM

## 2022-06-22 DIAGNOSIS — I110 Hypertensive heart disease with heart failure: Secondary | ICD-10-CM

## 2022-06-22 DIAGNOSIS — S7291XD Unspecified fracture of right femur, subsequent encounter for closed fracture with routine healing: Secondary | ICD-10-CM

## 2022-06-22 HISTORY — PX: ORIF FEMUR FRACTURE: SHX2119

## 2022-06-22 LAB — CBC
HCT: 33.6 % — ABNORMAL LOW (ref 36.0–46.0)
Hemoglobin: 10.9 g/dL — ABNORMAL LOW (ref 12.0–15.0)
MCH: 28.9 pg (ref 26.0–34.0)
MCHC: 32.4 g/dL (ref 30.0–36.0)
MCV: 89.1 fL (ref 80.0–100.0)
Platelets: 164 10*3/uL (ref 150–400)
RBC: 3.77 MIL/uL — ABNORMAL LOW (ref 3.87–5.11)
RDW: 14 % (ref 11.5–15.5)
WBC: 5.9 10*3/uL (ref 4.0–10.5)
nRBC: 0 % (ref 0.0–0.2)

## 2022-06-22 LAB — SURGICAL PCR SCREEN
MRSA, PCR: NEGATIVE
Staphylococcus aureus: NEGATIVE

## 2022-06-22 LAB — BASIC METABOLIC PANEL
Anion gap: 9 (ref 5–15)
BUN: 26 mg/dL — ABNORMAL HIGH (ref 8–23)
CO2: 25 mmol/L (ref 22–32)
Calcium: 8.6 mg/dL — ABNORMAL LOW (ref 8.9–10.3)
Chloride: 104 mmol/L (ref 98–111)
Creatinine, Ser: 1.6 mg/dL — ABNORMAL HIGH (ref 0.44–1.00)
GFR, Estimated: 34 mL/min — ABNORMAL LOW (ref >60)
Glucose, Bld: 113 mg/dL — ABNORMAL HIGH (ref 70–99)
Potassium: 3.9 mmol/L (ref 3.5–5.1)
Sodium: 138 mmol/L (ref 135–145)

## 2022-06-22 LAB — ABO/RH: ABO/RH(D): A POS

## 2022-06-22 SURGERY — OPEN REDUCTION INTERNAL FIXATION (ORIF) DISTAL FEMUR FRACTURE
Anesthesia: General | Site: Leg Upper | Laterality: Right

## 2022-06-22 MED ORDER — ROCURONIUM BROMIDE 10 MG/ML (PF) SYRINGE
PREFILLED_SYRINGE | INTRAVENOUS | Status: DC | PRN
  Administered 2022-06-22: 70 mg via INTRAVENOUS

## 2022-06-22 MED ORDER — ONDANSETRON HCL 4 MG/2ML IJ SOLN
INTRAMUSCULAR | Status: DC | PRN
  Administered 2022-06-22: 4 mg via INTRAVENOUS

## 2022-06-22 MED ORDER — CHLORHEXIDINE GLUCONATE 4 % EX LIQD: 60.0000 mL | Freq: Once | CUTANEOUS | Status: DC

## 2022-06-22 MED ORDER — CHLORHEXIDINE GLUCONATE 0.12 % MT SOLN
OROMUCOSAL | Status: AC
  Administered 2022-06-22: 15 mL via OROMUCOSAL
  Filled 2022-06-22: qty 15

## 2022-06-22 MED ORDER — ORAL CARE MOUTH RINSE: 15.0000 mL | Freq: Once | OROMUCOSAL | Status: AC

## 2022-06-22 MED ORDER — LACTATED RINGERS IV SOLN: INTRAVENOUS | Status: DC

## 2022-06-22 MED ORDER — ONDANSETRON HCL 4 MG PO TABS: 4.0000 mg | ORAL_TABLET | Freq: Four times a day (QID) | ORAL | Status: DC | PRN

## 2022-06-22 MED ORDER — LIDOCAINE 2% (20 MG/ML) 5 ML SYRINGE
INTRAMUSCULAR | Status: AC
  Filled 2022-06-22: qty 5

## 2022-06-22 MED ORDER — ROCURONIUM BROMIDE 10 MG/ML (PF) SYRINGE
PREFILLED_SYRINGE | INTRAVENOUS | Status: AC
  Filled 2022-06-22: qty 10

## 2022-06-22 MED ORDER — ACETAMINOPHEN 500 MG PO TABS
1000.0000 mg | ORAL_TABLET | Freq: Once | ORAL | Status: AC
  Administered 2022-06-22: 1000 mg via ORAL
  Filled 2022-06-22: qty 2

## 2022-06-22 MED ORDER — DEXAMETHASONE SODIUM PHOSPHATE 10 MG/ML IJ SOLN
INTRAMUSCULAR | Status: DC | PRN
  Administered 2022-06-22: 5 mg via INTRAVENOUS

## 2022-06-22 MED ORDER — DOCUSATE SODIUM 100 MG PO CAPS
100.0000 mg | ORAL_CAPSULE | Freq: Two times a day (BID) | ORAL | Status: DC
  Administered 2022-06-22 – 2022-06-27 (×12): 100 mg via ORAL
  Filled 2022-06-22 (×12): qty 1

## 2022-06-22 MED ORDER — DEXAMETHASONE SODIUM PHOSPHATE 10 MG/ML IJ SOLN
INTRAMUSCULAR | Status: AC
  Filled 2022-06-22: qty 1

## 2022-06-22 MED ORDER — ENOXAPARIN SODIUM 40 MG/0.4ML IJ SOSY
40.0000 mg | PREFILLED_SYRINGE | INTRAMUSCULAR | Status: DC
  Administered 2022-06-23 – 2022-06-28 (×6): 40 mg via SUBCUTANEOUS
  Filled 2022-06-22 (×6): qty 0.4

## 2022-06-22 MED ORDER — PROPOFOL 10 MG/ML IV BOLUS
INTRAVENOUS | Status: DC | PRN
  Administered 2022-06-22: 130 mg via INTRAVENOUS

## 2022-06-22 MED ORDER — PHENYLEPHRINE 80 MCG/ML (10ML) SYRINGE FOR IV PUSH (FOR BLOOD PRESSURE SUPPORT)
PREFILLED_SYRINGE | INTRAVENOUS | Status: DC | PRN
  Administered 2022-06-22: 80 ug via INTRAVENOUS
  Administered 2022-06-22: 160 ug via INTRAVENOUS

## 2022-06-22 MED ORDER — LIDOCAINE 2% (20 MG/ML) 5 ML SYRINGE
INTRAMUSCULAR | Status: DC | PRN
  Administered 2022-06-22: 80 mg via INTRAVENOUS

## 2022-06-22 MED ORDER — SUGAMMADEX SODIUM 200 MG/2ML IV SOLN
INTRAVENOUS | Status: DC | PRN
  Administered 2022-06-22: 200 mg via INTRAVENOUS

## 2022-06-22 MED ORDER — FENTANYL CITRATE (PF) 250 MCG/5ML IJ SOLN
INTRAMUSCULAR | Status: AC
  Filled 2022-06-22: qty 5

## 2022-06-22 MED ORDER — BUPIVACAINE HCL (PF) 0.25 % IJ SOLN
INTRAMUSCULAR | Status: DC | PRN
  Administered 2022-06-22: 30 mL

## 2022-06-22 MED ORDER — METOCLOPRAMIDE HCL 5 MG PO TABS: 5.0000 mg | ORAL_TABLET | Freq: Three times a day (TID) | ORAL | Status: DC | PRN

## 2022-06-22 MED ORDER — POVIDONE-IODINE 10 % EX SWAB: 2.0000 | Freq: Once | CUTANEOUS | Status: DC

## 2022-06-22 MED ORDER — PHENYLEPHRINE HCL-NACL 20-0.9 MG/250ML-% IV SOLN
INTRAVENOUS | Status: DC | PRN
  Administered 2022-06-22: 30 ug/min via INTRAVENOUS

## 2022-06-22 MED ORDER — FENTANYL CITRATE (PF) 100 MCG/2ML IJ SOLN: 25.0000 ug | INTRAMUSCULAR | Status: DC | PRN

## 2022-06-22 MED ORDER — HYOSCYAMINE SULFATE 0.125 MG PO TBDP: 0.2500 mg | ORAL_TABLET | Freq: Four times a day (QID) | ORAL | Status: DC | PRN

## 2022-06-22 MED ORDER — ONDANSETRON HCL 4 MG/2ML IJ SOLN: 4.0000 mg | Freq: Four times a day (QID) | INTRAMUSCULAR | Status: DC | PRN

## 2022-06-22 MED ORDER — 0.9 % SODIUM CHLORIDE (POUR BTL) OPTIME
TOPICAL | Status: DC | PRN
  Administered 2022-06-22: 1000 mL

## 2022-06-22 MED ORDER — CEFAZOLIN SODIUM-DEXTROSE 1-4 GM/50ML-% IV SOLN
1.0000 g | Freq: Two times a day (BID) | INTRAVENOUS | Status: AC
  Administered 2022-06-22: 1 g via INTRAVENOUS
  Filled 2022-06-22: qty 50

## 2022-06-22 MED ORDER — CEFAZOLIN SODIUM-DEXTROSE 2-4 GM/100ML-% IV SOLN
2.0000 g | INTRAVENOUS | Status: AC
  Administered 2022-06-22: 2 g via INTRAVENOUS
  Filled 2022-06-22: qty 100

## 2022-06-22 MED ORDER — POLYVINYL ALCOHOL 1.4 % OP SOLN: 1.0000 [drp] | Freq: Two times a day (BID) | OPHTHALMIC | Status: DC | PRN

## 2022-06-22 MED ORDER — ONDANSETRON HCL 4 MG/2ML IJ SOLN: 4.0000 mg | Freq: Once | INTRAMUSCULAR | Status: DC | PRN

## 2022-06-22 MED ORDER — ONDANSETRON HCL 4 MG/2ML IJ SOLN
INTRAMUSCULAR | Status: AC
  Filled 2022-06-22: qty 2

## 2022-06-22 MED ORDER — METOCLOPRAMIDE HCL 5 MG/ML IJ SOLN: 5.0000 mg | Freq: Three times a day (TID) | INTRAMUSCULAR | Status: DC | PRN

## 2022-06-22 MED ORDER — FENTANYL CITRATE (PF) 250 MCG/5ML IJ SOLN
INTRAMUSCULAR | Status: DC | PRN
  Administered 2022-06-22 (×3): 50 ug via INTRAVENOUS
  Administered 2022-06-22: 100 ug via INTRAVENOUS

## 2022-06-22 MED ORDER — PROPOFOL 10 MG/ML IV BOLUS
INTRAVENOUS | Status: AC
  Filled 2022-06-22: qty 20

## 2022-06-22 MED ORDER — CHLORHEXIDINE GLUCONATE 0.12 % MT SOLN: 15.0000 mL | Freq: Once | OROMUCOSAL | Status: AC

## 2022-06-22 MED ORDER — BUPIVACAINE HCL (PF) 0.25 % IJ SOLN
INTRAMUSCULAR | Status: AC
  Filled 2022-06-22: qty 30

## 2022-06-22 SURGICAL SUPPLY — 81 items
BAG COUNTER SPONGE SURGICOUNT (BAG) ×1 IMPLANT
BAG SPNG CNTER NS LX DISP (BAG) ×1
BIT DRILL 4.3 (BIT) ×1
BIT DRILL 4.3X300MM (BIT) IMPLANT
BIT DRILL LONG 3.3 (BIT) IMPLANT
BIT DRILL QC 3.3X195 (BIT) ×2 IMPLANT
BLADE CLIPPER SURG (BLADE) IMPLANT
BNDG ELASTIC 4X5.8 VLCR STR LF (GAUZE/BANDAGES/DRESSINGS) ×1 IMPLANT
BNDG ELASTIC 6X5.8 VLCR STR LF (GAUZE/BANDAGES/DRESSINGS) ×1 IMPLANT
BNDG GAUZE DERMACEA FLUFF 4 (GAUZE/BANDAGES/DRESSINGS) ×1 IMPLANT
BNDG GZE DERMACEA 4 6PLY (GAUZE/BANDAGES/DRESSINGS) ×1
BRUSH SCRUB EZ PLAIN DRY (MISCELLANEOUS) ×2 IMPLANT
CANISTER SUCT 3000ML PPV (MISCELLANEOUS) ×1 IMPLANT
CAP LOCK NCB (Cap) IMPLANT
COVER SURGICAL LIGHT HANDLE (MISCELLANEOUS) ×1 IMPLANT
DRAPE C-ARM 42X72 X-RAY (DRAPES) ×1 IMPLANT
DRAPE C-ARMOR (DRAPES) ×1 IMPLANT
DRAPE IMP U-DRAPE 54X76 (DRAPES) ×1 IMPLANT
DRAPE ORTHO SPLIT 77X108 STRL (DRAPES) ×3
DRAPE SURG ORHT 6 SPLT 77X108 (DRAPES) ×3 IMPLANT
DRAPE U-SHAPE 47X51 STRL (DRAPES) ×1 IMPLANT
DRSG ADAPTIC 3X8 NADH LF (GAUZE/BANDAGES/DRESSINGS) ×1 IMPLANT
DRSG MEPILEX POST OP 4X8 (GAUZE/BANDAGES/DRESSINGS) IMPLANT
ELECT REM PT RETURN 9FT ADLT (ELECTROSURGICAL) ×1
ELECTRODE REM PT RTRN 9FT ADLT (ELECTROSURGICAL) ×1 IMPLANT
EVACUATOR 1/8 PVC DRAIN (DRAIN) IMPLANT
EVACUATOR 3/16  PVC DRAIN (DRAIN)
EVACUATOR 3/16 PVC DRAIN (DRAIN) IMPLANT
GAUZE PAD ABD 8X10 STRL (GAUZE/BANDAGES/DRESSINGS) ×4 IMPLANT
GAUZE SPONGE 4X4 12PLY STRL (GAUZE/BANDAGES/DRESSINGS) ×1 IMPLANT
GLOVE BIO SURGEON STRL SZ7.5 (GLOVE) ×1 IMPLANT
GLOVE BIO SURGEON STRL SZ8 (GLOVE) ×1 IMPLANT
GLOVE BIOGEL PI IND STRL 7.5 (GLOVE) ×1 IMPLANT
GLOVE BIOGEL PI IND STRL 8 (GLOVE) ×1 IMPLANT
GLOVE SURG ORTHO LTX SZ7.5 (GLOVE) ×2 IMPLANT
GOWN STRL REUS W/ TWL LRG LVL3 (GOWN DISPOSABLE) ×2 IMPLANT
GOWN STRL REUS W/ TWL XL LVL3 (GOWN DISPOSABLE) ×1 IMPLANT
GOWN STRL REUS W/TWL LRG LVL3 (GOWN DISPOSABLE) ×2
GOWN STRL REUS W/TWL XL LVL3 (GOWN DISPOSABLE) ×1
K-WIRE 2.0 (WIRE) ×4
K-WIRE FXSTD 280X2XNS SS (WIRE) ×4
KIT BASIN OR (CUSTOM PROCEDURE TRAY) ×1 IMPLANT
KIT TURNOVER KIT B (KITS) ×1 IMPLANT
KWIRE FXSTD 280X2XNS SS (WIRE) IMPLANT
NDL 22X1.5 STRL (OR ONLY) (MISCELLANEOUS) IMPLANT
NEEDLE 22X1.5 STRL (OR ONLY) (MISCELLANEOUS) IMPLANT
NS IRRIG 1000ML POUR BTL (IV SOLUTION) ×1 IMPLANT
PACK TOTAL JOINT (CUSTOM PROCEDURE TRAY) ×1 IMPLANT
PACK UNIVERSAL I (CUSTOM PROCEDURE TRAY) ×1 IMPLANT
PAD ARMBOARD 7.5X6 YLW CONV (MISCELLANEOUS) ×2 IMPLANT
PAD CAST 4YDX4 CTTN HI CHSV (CAST SUPPLIES) ×1 IMPLANT
PADDING CAST COTTON 4X4 STRL (CAST SUPPLIES) ×1
PADDING CAST COTTON 6X4 STRL (CAST SUPPLIES) ×1 IMPLANT
PLATE NCB PPP 9H (Plate) IMPLANT
SCREW 5.0 70MM (Screw) IMPLANT
SCREW CORTICAL 5.0X22MM (Screw) IMPLANT
SCREW CORTICAL NCB 5.0X40 (Screw) IMPLANT
SCREW CORTICAL NCB 5.0X65 (Screw) IMPLANT
SCREW NCB 3.5X75X5X6.2XST (Screw) IMPLANT
SCREW NCB 4.0MX44M (Screw) IMPLANT
SCREW NCB 4.0X36MM (Screw) IMPLANT
SCREW NCB 5.0MMX24 (Screw) IMPLANT
SCREW NCB 5.0X36MM (Screw) IMPLANT
SCREW NCB 5.0X75MM (Screw) ×4 IMPLANT
SPONGE T-LAP 18X18 ~~LOC~~+RFID (SPONGE) ×1 IMPLANT
STAPLER VISISTAT 35W (STAPLE) ×1 IMPLANT
SUCTION FRAZIER HANDLE 10FR (MISCELLANEOUS) ×1
SUCTION TUBE FRAZIER 10FR DISP (MISCELLANEOUS) ×1 IMPLANT
SUT ETHILON 2 0 PSLX (SUTURE) IMPLANT
SUT PROLENE 0 CT 2 (SUTURE) IMPLANT
SUT VIC AB 0 CT1 27 (SUTURE)
SUT VIC AB 0 CT1 27XBRD ANBCTR (SUTURE) ×2 IMPLANT
SUT VIC AB 1 CT1 27 (SUTURE) ×1
SUT VIC AB 1 CT1 27XBRD ANBCTR (SUTURE) ×2 IMPLANT
SUT VIC AB 2-0 CT1 27 (SUTURE) ×1
SUT VIC AB 2-0 CT1 TAPERPNT 27 (SUTURE) ×2 IMPLANT
SYR 20ML ECCENTRIC (SYRINGE) IMPLANT
TOWEL GREEN STERILE (TOWEL DISPOSABLE) ×2 IMPLANT
TOWEL GREEN STERILE FF (TOWEL DISPOSABLE) ×1 IMPLANT
TRAY FOLEY MTR SLVR 16FR STAT (SET/KITS/TRAYS/PACK) IMPLANT
WATER STERILE IRR 1000ML POUR (IV SOLUTION) ×2 IMPLANT

## 2022-06-22 NOTE — Progress Notes (Signed)
See full consultation nearby.  The risks and benefits of surgery for right distal femur repair were discussed with the patient, including the possibility of infection, nerve injury, vessel injury, wound breakdown, arthritis, symptomatic hardware, DVT/ PE, loss of motion, malunion, nonunion, and need for further surgery among others.  We also specifically discussed the possibility of blood loss that could require transfusion. These risks were acknowledged and consent provided to proceed.   Marisa [place], MD [facility], [facility] [phone]

## 2022-06-22 NOTE — ED Notes (Signed)
TC to OR this Probation officer spoke to pre-op RN. Anti-Bx will be hung by OR staff. IV anti-BX is available in OR.

## 2022-06-22 NOTE — Anesthesia Postprocedure Evaluation (Signed)
Anesthesia Post Note  Patient: [name]  Procedure(s) Performed: RIGHT OPEN REDUCTION INTERNAL FIXATION (ORIF) DISTAL FEMUR FRACTURE (Right: Leg Upper)     Patient location during evaluation: PACU Anesthesia Type: General Level of consciousness: awake and alert Pain management: pain level controlled Vital Signs Assessment: post-procedure vital signs reviewed and stable Respiratory status: spontaneous breathing, nonlabored ventilation, respiratory function stable and patient connected to nasal cannula oxygen Cardiovascular status: blood pressure returned to baseline and stable Postop Assessment: no apparent nausea or vomiting Anesthetic complications: no   No notable events documented.  Last Vitals:  Vitals:   06/22/22 1340 06/22/22 1359  BP: 123/61 (!) 128/47  Pulse: 68   Resp: 14 14  Temp: 37.2 C   SpO2: 97%     Last Pain:  Vitals:   06/22/22 1412  TempSrc:   PainSc: [place]

## 2022-06-22 NOTE — ED Notes (Signed)
Report called to pre-op

## 2022-06-22 NOTE — Progress Notes (Signed)
PROGRESS NOTE  [name] WGN:[id number] DOB: Nov 16, 1950 DOA: 06/21/2022 PCP: Lorrene Reid, PA-C   LOS: 1 day   Brief Narrative / Interim history: 71 year old female with history of RA, chronic diastolic CHF, HTN, HLD, OSA comes in with a fall.  She was cleaning her house, they are looking to downsize, and apparently tripped and had a ground-level fall.  She experienced right-sided hip pain and came to the hospital.  Imaging in the ED showed a periprosthetic distal femur fracture, orthopedic surgery was consulted and we are asked to admit  Subjective / 24h Interval events: She is doing well, denies any significant pain.  She denies any loss of consciousness, palpitations, chest pains prior to fall.  She was in her normal state of health up until this happened  Assesement and Plan: Principal Problem:   Femur fracture ([place]) Active Problems:   OSA (obstructive sleep apnea)   Rheumatoid arthritis (HCC)   PVC's (premature ventricular contractions)   Class 2 obesity due to excess calories with body mass index (BMI) of 36.0 to 36.9 in adult   Chronic diastolic CHF (congestive heart failure) (HCC)   Chronic kidney disease, stage 3b (HCC)   DNR (do not resuscitate)  Principal problem Periprosthetic distal femur fracture-orthopedic surgery consulted, appreciate input.  It looks like she will be taken for ORIF today by Dr. Ginette Pitman  Active problems RA -Continue home medications   Chronic diastolic CHF-Echo on 4/4 with preserved EF and grade 1 DD.  Currently appears euvolemic, hold diuretics for now and resume postoperatively  Colitis -Continue Colestid, hyoscyamine   PVCs-Continue amiodarone, Toprol XL   HTN-Continue Toprol XL   HLD-Continue fenofibrate, rosuvastatin   OSA-Continue CPAP   Stage 3b CKD-Appears to be stable at this time.  Continue to monitor    Scheduled Meds:  acetaminophen  1,000 mg Oral Once   amiodarone  200 mg Oral Daily   aspirin EC  81 mg Oral QHS    chlorhexidine  60 mL Topical Once   colestipol  2 g Oral QHS   docusate sodium  100 mg Oral BID   doxylamine (Sleep)  25 mg Oral QHS   fenofibrate  54 mg Oral QHS   gabapentin  600 mg Oral BID   hydroxychloroquine  200 mg Oral Q MTWThF   leflunomide  20 mg Oral Daily   loratadine  10 mg Oral QHS   metoprolol succinate  12.5 mg Oral Daily   povidone-iodine  2 Application Topical Once   rosuvastatin  10 mg Oral Daily   Continuous Infusions:   ceFAZolin (ANCEF) IV     methocarbamol (ROBAXIN) IV     PRN Meds:.acetaminophen, bisacodyl, cycloSPORINE, HYDROmorphone (DILAUDID) injection, hyoscyamine, methocarbamol **OR** methocarbamol (ROBAXIN) IV, ondansetron (ZOFRAN) IV, oxyCODONE, polyethylene glycol  Current Outpatient Medications  Medication Instructions   amiodarone (PACERONE) 200 mg, Oral, Daily   aspirin EC 81 mg, Oral, Daily at bedtime   cetirizine (ZYRTEC) 10 mg, Oral, Daily   colestipol (COLESTID) 2 g, Oral, Daily at bedtime   cycloSPORINE (RESTASIS) 0.05 % ophthalmic emulsion 2 drops, Both Eyes, 2 times daily PRN   diphenoxylate-atropine (LOMOTIL) 2.5-0.025 MG per tablet 1 tablet, Oral, 4 times daily PRN   doxylamine (Sleep) (UNISOM) 25 mg, Oral, At bedtime PRN   fenofibrate (TRICOR) 48 mg, Oral, Daily   fluticasone (FLONASE) 50 MCG/ACT nasal spray 1 spray, Each Nare, Daily   gabapentin (NEURONTIN) 300 MG capsule TAKE 2 CAPSULES BY MOUTH 2 TIMES DAILY.   hydroxychloroquine (PLAQUENIL) 200  mg, Oral, Daily   hyoscyamine (ANASPAZ) 0.25 mg, Sublingual, Every 6 hours PRN   ibuprofen (ADVIL) 800 mg, Oral, Every 6 hours PRN   leflunomide (ARAVA) 20 mg, Oral, Daily   lidocaine (LIDODERM) 5 % PLACE 1 PATCH ONTO THE SKIN DAILY AS NEEDED FOR PAIN. REMOVE AND DISCARD PATCH WITHIN 12 HOURS OR AS DIRECTED BY MD.   metoprolol succinate (TOPROL-XL) 12.5 mg, Oral, Daily   Multiple Vitamin (MULTIVITAMIN WITH MINERALS) TABS tablet 1 tablet, Oral, Daily   NONFORMULARY OR COMPOUNDED ITEM  Triamcinolone 0.1% & Silvadene cream 1:1- Apply as directed to affected areas as needed   omeprazole (PRILOSEC) 40 mg, Oral, As needed   potassium chloride (KLOR-CON) 10 MEQ tablet 20 mEq, Oral, Daily   Probiotic Product (PROBIOTIC DAILY PO) Oral, Ultraflora IB Probiotic    promethazine (PHENERGAN) 25 MG tablet TAKE 1 TABLET BY MOUTH EVERY 6 HOURS AS NEEDED.   rosuvastatin (CRESTOR) 10 mg, Oral, Daily   Semaglutide (1 MG/DOSE) 1 mg, Injection, Weekly   spironolactone (ALDACTONE) 25 mg, Oral, Daily   Torsemide 40 mg, Oral, Daily   UNABLE TO FIND CPAP: AT bedtime; setting is "12"     Diet Orders (From admission, onward)     Start     Ordered   06/21/22 1612  Diet Heart Room service appropriate? Yes; Fluid consistency: Thin  Diet effective now       Question Answer Comment  Room service appropriate? Yes   Fluid consistency: Thin      06/21/22 1611            DVT prophylaxis: SCDs Start: 06/21/22 1611   Lab Results  Component Value Date   PLT 164 06/22/2022      Code Status: DNR  Family Communication: daughter at bedside   Status is: Inpatient  Remains inpatient appropriate because: surgery today   Level of care: Med-Surg  Consultants:  Orthopedic surgery  Objective: Vitals:   06/22/22 0100 06/22/22 0130 06/22/22 0615 06/22/22 0935  BP: 133/66 107/63 (!) 127/91 118/68  Pulse: 69 71 72 75  Resp: '10 16 10 13  '$ Temp:    98 F (36.7 C)  SpO2: 94% 91% 91% 95%   No intake or output data in the 24 hours ending 06/22/22 0936 Wt Readings from Last 3 Encounters:  05/25/22 88.5 kg  05/08/22 89.8 kg  03/31/22 92.6 kg    Examination:  Constitutional: NAD Eyes: no scleral icterus ENMT: Mucous membranes are moist.  Neck: normal, supple Respiratory: clear to auscultation bilaterally, no wheezing, no crackles. Normal respiratory effort. No accessory muscle use.  Cardiovascular: Regular rate and rhythm, no murmurs / rubs / gallops. No LE edema.  Abdomen: non  distended, no tenderness. Bowel sounds positive.  Musculoskeletal: no clubbing / cyanosis.   Data Reviewed: I have independently reviewed following labs and imaging studies   CBC Recent Labs  Lab 06/21/22 1620 06/22/22 0742  WBC 7.8 5.9  HGB 12.0 10.9*  HCT 37.3 33.6*  PLT 207 164  MCV 88.6 89.1  MCH 28.5 28.9  MCHC 32.2 32.4  RDW 14.0 14.0    Recent Labs  Lab 06/21/22 1620 06/22/22 0742  NA 141 138  K 3.6 3.9  CL 103 104  CO2 25 25  GLUCOSE 94 113*  BUN 30* 26*  CREATININE 1.51* 1.60*  CALCIUM 8.8* 8.6*  INR 1.1  --     ------------------------------------------------------------------------------------------------------------------ No results for input(s): "CHOL", "HDL", "LDLCALC", "TRIG", "CHOLHDL", "LDLDIRECT" in the last 72 hours.  Lab  Results  Component Value Date   HGBA1C 5.3 03/27/2022   ------------------------------------------------------------------------------------------------------------------ No results for input(s): "TSH", "T4TOTAL", "T3FREE", "THYROIDAB" in the last 72 hours.  Invalid input(s): "FREET3"  Cardiac Enzymes No results for input(s): "CKMB", "TROPONINI", "MYOGLOBIN" in the last 168 hours.  Invalid input(s): "CK" ------------------------------------------------------------------------------------------------------------------    Component Value Date/Time   BNP 44.4 09/26/2017 1325    CBG: No results for input(s): "GLUCAP" in the last 168 hours.  No results found for this or any previous visit (from the past 240 hour(s)).   Radiology Studies: DG Cervical Spine With Flex & Extend  Result Date: 06/21/2022 CLINICAL DATA:  Rheumatoid arthritis. EXAM: CERVICAL SPINE COMPLETE WITH FLEXION AND EXTENSION VIEWS COMPARISON:  None Available. FINDINGS: Evaluation is very limited due to positioning and superimposition of the soft tissues of the cervical spine. No definite acute fracture or subluxation. The bones are osteopenic. The  visualized odontoid and the posterior elements appear intact. There is anatomic alignment of the lateral masses of C1 and C2. The soft tissues are grossly unremarkable. IMPRESSION: 1. Very limited study. No definite acute fracture or subluxation. 2. Osteopenia. Electronically Signed   By: Anner Crete M.D.   On: 06/21/2022 18:26   DG Femur Min 2 Views Right  Result Date: 06/21/2022 CLINICAL DATA:  Fall EXAM: RIGHT FEMUR 2 VIEWS COMPARISON:  06/21/2022 FINDINGS: Right hip negative. Right knee replacement. Supracondylar fracture distal right femur with a 1/2 shaft with of lateral displacement. Nearly 1 shaft with posterior displacement. No change from earlier today IMPRESSION: Displaced supracondylar fracture distal right femur. Right knee replacement. No other femur fracture. Electronically Signed   By: Franchot Gallo M.D.   On: 06/21/2022 15:01   DG Knee Complete 4 Views Right  Result Date: 06/21/2022 CLINICAL DATA:  Pt tripped and fell onto right side onto hardwood floor today - having pain around right knee area, hx of right knee replacement concern for dislocated TKA EXAM: RIGHT KNEE - COMPLETE 4+ VIEW COMPARISON:  None Available. FINDINGS: Oblique fracture through the distal RIGHT femoral metaphysis just above the distal femoral prosthetic. 2 cm lateral displacement the distal fracture fragment. On lateral projection there is posterior angulation of the distal fracture fragment and override approximately 1.7 cm IMPRESSION: Acute oblique fracture through the distal femoral metaphysis above the prosthetic. Electronically Signed   By: Suzy Bouchard M.D.   On: 06/21/2022 14:54     Marzetta Board, MD, PhD [facility]  Between 7 am - 7 pm I am available, please contact me via Amion (for emergencies) or Securechat (non urgent messages)  Between 7 pm - 7 am I am not available, please contact night coverage MD/APP via Amion

## 2022-06-22 NOTE — Transfer of Care (Signed)
Immediate Anesthesia Transfer of Care Note  Patient: [name]  Procedure(s) Performed: RIGHT OPEN REDUCTION INTERNAL FIXATION (ORIF) DISTAL FEMUR FRACTURE (Right: Leg Upper)  Patient Location: PACU  Anesthesia Type:General  Level of Consciousness: awake, alert , and oriented  Airway & Oxygen Therapy: Patient Spontanous Breathing and Patient connected to nasal cannula oxygen  Post-op Assessment: Report given to RN, Post -op Vital signs reviewed and stable, and Patient moving all extremities  Post vital signs: Reviewed and stable  Last Vitals:  Vitals Value Taken Time  BP 134/78 06/22/22 1310  Temp 37.2 C 06/22/22 1310  Pulse 69 06/22/22 1313  Resp 11 06/22/22 1313  SpO2 95 % 06/22/22 1313  Vitals shown include unvalidated device data.  Last Pain:  Vitals:   06/22/22 1310  TempSrc:   PainSc: 7       Patients Stated Pain Goal: 0 (18/59/09 3112)  Complications: No notable events documented.

## 2022-06-22 NOTE — Anesthesia Preprocedure Evaluation (Addendum)
Anesthesia Evaluation  Patient identified by MRN, date of birth, ID band Patient awake    Reviewed: Allergy & Precautions, NPO status , Patient's Chart, lab work & pertinent test results, reviewed documented beta blocker date and time   Airway Mallampati: III  TM Distance: >3 FB Neck ROM: Full    Dental  (+) Dental Advisory Given, Edentulous Upper, Missing   Pulmonary sleep apnea    Pulmonary exam normal breath sounds clear to auscultation       Cardiovascular hypertension, Pt. on medications and Pt. on home beta blockers + CAD and +CHF  Normal cardiovascular exam+ dysrhythmias  Rhythm:Regular Rate:Normal     Neuro/Psych  Neuromuscular disease    GI/Hepatic Neg liver ROS, hiatal hernia,GERD  Medicated,,  Endo/Other  negative endocrine ROS    Renal/GU Renal InsufficiencyRenal disease     Musculoskeletal  (+) Arthritis , Rheumatoid disorders,  Fibromyalgia -Right Distal Femur Fracture  sjorgens syndrome   Abdominal   Peds  Hematology  (+) Blood dyscrasia, anemia   Anesthesia Other Findings Day of surgery medications reviewed with the patient.  Reproductive/Obstetrics                             Anesthesia Physical Anesthesia Plan  ASA: 3  Anesthesia Plan: General   Post-op Pain Management: Tylenol PO (pre-op)* and Gabapentin PO (pre-op)*   Induction: Intravenous and Rapid sequence  PONV Risk Score and Plan: 3 and Dexamethasone and Ondansetron  Airway Management Planned: Oral ETT  Additional Equipment:   Intra-op Plan:   Post-operative Plan: Extubation in OR  Informed Consent: I have reviewed the patients History and Physical, chart, labs and discussed the procedure including the risks, benefits and alternatives for the proposed anesthesia with the patient or authorized representative who has indicated his/her understanding and acceptance.   Patient has DNR.  Discussed DNR  with patient and Suspend DNR.   Dental advisory given  Plan Discussed with: CRNA  Anesthesia Plan Comments:        Anesthesia Quick Evaluation

## 2022-06-22 NOTE — Anesthesia Procedure Notes (Addendum)
Procedure Name: Intubation Date/Time: 06/22/2022 11:00 AM  Performed by: Santa Lighter, MDPre-anesthesia Checklist: Patient identified, Emergency Drugs available, Suction available and Patient being monitored Patient Re-evaluated:Patient Re-evaluated prior to induction Oxygen Delivery Method: Circle system utilized Preoxygenation: Pre-oxygenation with 100% oxygen Induction Type: IV induction Ventilation: Mask ventilation without difficulty Laryngoscope Size: Glidescope and 3 Grade View: Grade I Tube type: Oral Tube size: 7.0 mm Number of attempts: 1 Airway Equipment and Method: Stylet and Oral airway Placement Confirmation: ETT inserted through vocal cords under direct vision, positive ETCO2 and breath sounds checked- equal and bilateral Secured at: 21 cm Tube secured with: Tape Dental Injury: Teeth and Oropharynx as per pre-operative assessment

## 2022-06-22 NOTE — Progress Notes (Signed)
Pt resting comfortably with home CPAP and 2LPM bled in. RN at bedside. RT will continue to monitor.

## 2022-06-22 NOTE — Progress Notes (Signed)
PHARMACY NOTE:  ANTIMICROBIAL RENAL DOSAGE ADJUSTMENT  Current antimicrobial regimen includes a mismatch between antimicrobial dosage and estimated renal function.  As per policy approved by the Pharmacy & Therapeutics and Medical Executive Committees, the antimicrobial dosage will be adjusted accordingly.  Current antimicrobial dosage:  cefazolin 1g q6 hr  Indication: surgical prophylaxis  Renal Function:  Estimated Creatinine Clearance: 33.8 mL/min (A) (by C-G formula based on SCr of 1.6 mg/dL (H)).     Antimicrobial dosage has been changed to:  1g q12 hr  Additional comments:   Thank you for allowing pharmacy to be a part of this patient's care.   Benetta Spar, PharmD, BCPS, BCCP Clinical Pharmacist  Please check AMION for all [place] phone numbers After 10:00 PM, call [place] [phone]

## 2022-06-23 ENCOUNTER — Inpatient Hospital Stay (HOSPITAL_COMMUNITY): Payer: PPO

## 2022-06-23 DIAGNOSIS — S7291XD Unspecified fracture of right femur, subsequent encounter for closed fracture with routine healing: Secondary | ICD-10-CM | POA: Diagnosis not present

## 2022-06-23 DIAGNOSIS — I5032 Chronic diastolic (congestive) heart failure: Secondary | ICD-10-CM | POA: Diagnosis not present

## 2022-06-23 DIAGNOSIS — N1832 Chronic kidney disease, stage 3b: Secondary | ICD-10-CM | POA: Diagnosis not present

## 2022-06-23 LAB — CBC
HCT: 31.8 % — ABNORMAL LOW (ref 36.0–46.0)
Hemoglobin: 10.3 g/dL — ABNORMAL LOW (ref 12.0–15.0)
MCH: 28.8 pg (ref 26.0–34.0)
MCHC: 32.4 g/dL (ref 30.0–36.0)
MCV: 88.8 fL (ref 80.0–100.0)
Platelets: 160 10*3/uL (ref 150–400)
RBC: 3.58 MIL/uL — ABNORMAL LOW (ref 3.87–5.11)
RDW: 13.7 % (ref 11.5–15.5)
WBC: 8.2 10*3/uL (ref 4.0–10.5)
nRBC: 0 % (ref 0.0–0.2)

## 2022-06-23 LAB — COMPREHENSIVE METABOLIC PANEL
ALT: 15 U/L (ref 0–44)
AST: 26 U/L (ref 15–41)
Albumin: 2.8 g/dL — ABNORMAL LOW (ref 3.5–5.0)
Alkaline Phosphatase: 45 U/L (ref 38–126)
Anion gap: 10 (ref 5–15)
BUN: 18 mg/dL (ref 8–23)
CO2: 26 mmol/L (ref 22–32)
Calcium: 8.5 mg/dL — ABNORMAL LOW (ref 8.9–10.3)
Chloride: 99 mmol/L (ref 98–111)
Creatinine, Ser: 1.34 mg/dL — ABNORMAL HIGH (ref 0.44–1.00)
GFR, Estimated: 43 mL/min — ABNORMAL LOW (ref >60)
Glucose, Bld: 139 mg/dL — ABNORMAL HIGH (ref 70–99)
Potassium: 4.2 mmol/L (ref 3.5–5.1)
Sodium: 135 mmol/L (ref 135–145)
Total Bilirubin: 0.7 mg/dL (ref 0.3–1.2)
Total Protein: 5.4 g/dL — ABNORMAL LOW (ref 6.5–8.1)

## 2022-06-23 LAB — MAGNESIUM: Magnesium: 2.2 mg/dL (ref 1.7–2.4)

## 2022-06-23 LAB — CYTOLOGY - NON PAP

## 2022-06-23 LAB — URINE CULTURE
Culture: NO GROWTH
Special Requests: NORMAL

## 2022-06-23 LAB — VITAMIN D 25 HYDROXY (VIT D DEFICIENCY, FRACTURES): Vit D, 25-Hydroxy: 41.92 ng/mL (ref 30–100)

## 2022-06-23 MED ORDER — ADULT MULTIVITAMIN W/MINERALS CH
1.0000 | ORAL_TABLET | Freq: Every day | ORAL | Status: DC
  Administered 2022-06-23 – 2022-06-28 (×5): 1 via ORAL
  Filled 2022-06-23 (×5): qty 1

## 2022-06-23 MED ORDER — GLUCERNA SHAKE PO LIQD
237.0000 mL | Freq: Two times a day (BID) | ORAL | Status: DC
  Administered 2022-06-23 – 2022-06-28 (×6): 237 mL via ORAL
  Filled 2022-06-23 (×2): qty 237

## 2022-06-23 MED ORDER — ACETAMINOPHEN 325 MG PO TABS
650.0000 mg | ORAL_TABLET | Freq: Three times a day (TID) | ORAL | Status: DC
  Administered 2022-06-23 – 2022-06-28 (×16): 650 mg via ORAL
  Filled 2022-06-23 (×16): qty 2

## 2022-06-23 NOTE — Progress Notes (Signed)
Orthopedic Tech Progress Note Patient Details:  [name] 02/11/1951 [id number]  Air cast applied and adjusted to RLE without complication. Fresh ice packs were also placed to R thigh. Call bell is within reach and all needs met at this time.   Ortho Devices Type of Ortho Device: Ankle Air splint Ortho Device/Splint Location: RLE Ortho Device/Splint Interventions: Ordered, Application, Adjustment   Post Interventions Patient Tolerated: Fair Instructions Provided: Care of device, Adjustment of device  Elliett Guarisco Jeri Modena 06/23/2022, 5:08 PM

## 2022-06-23 NOTE — Evaluation (Addendum)
Physical Therapy Evaluation Patient Details Name: [name] MRN: [id number] DOB: 26-Jan-1951 Today's Date: 06/23/2022  History of Present Illness  Patient is a 71 year old female with history of RA, chronic diastolic CHF, HTN, HLD, OSA comes in with a fall.  She was cleaning her house, they are looking to downsize, and apparently tripped and had a ground-level fall.  She experienced right-sided hip pain and came to the hospital.  Imaging in the ED showed a periprosthetic distal femur fracture s/p ORIF of right distance femur.  Clinical Impression  The patient is agreeable to PT evaluation and motivated to participate. Multiple family members live with her in a home with a ramped entrance. The patient uses a rollator or a cane intermittently at baseline but is independent otherwise with mobility at home.  Today, the patient required moderate assistance with bed mobility with cues for sequencing. Increased pain in the right leg with movement. No dizziness while sitting upright. Of note, patient complains of mild right ankle pain that is tender to palpation at right lateral malleolus area (MD alerted and ordered X-ray). Min A- Mod A for incremental lateral scooting transfer with cues for technique to increase independence. Standing not attempted as patient requesting to wait for imaging to be completed for the right ankle. She was instructed on therapeutic exercises for RLE and performed with assistance. Recommend to continue PT to maximize independence and to decrease caregiver burden. Patient and family are requesting inpatient rehab at this time.      Recommendations for follow up therapy are one component of a multi-disciplinary discharge planning process, led by the attending physician.  Recommendations may be updated based on patient status, additional functional criteria and insurance authorization.  Follow Up Recommendations Acute inpatient rehab (3hours/day)      Assistance Recommended at  Discharge Intermittent Supervision/Assistance  Patient can return home with the following  A lot of help with walking and/or transfers;A little help with bathing/dressing/bathroom;Assist for transportation;Help with stairs or ramp for entrance    Equipment Recommendations Wheelchair (measurements PT);BSC/3in1 (if going home)  Recommendations for Other Services       Functional Status Assessment Patient has had a recent decline in their functional status and demonstrates the ability to make significant improvements in function in a reasonable and predictable amount of time.     Precautions / Restrictions Precautions Precautions: Fall Restrictions Weight Bearing Restrictions: Yes RLE Weight Bearing: Weight bearing as tolerated Other Position/Activity Restrictions: ROM as tolerated R knee      Mobility  Bed Mobility Overal bed mobility: Needs Assistance Bed Mobility: Supine to Sit, Sit to Supine     Supine to sit: Mod assist Sit to supine: Mod assist   General bed mobility comments: assistance for RLE support and occasional LLE support provided. verbal cues for sequencing and technique    Transfers Overall transfer level: Needs assistance                Lateral/Scoot Transfers: Min assist, Mod assist General transfer comment: patient performed 3 bouts of scooting to the left with Min A and then required Mod A due to fatigue. verbal cues for technique. of note, the right ankle was tender to palpation andpatient deferred attempting to stand until imaging completed (MD alerted in the room and ordered X-ray)    Ambulation/Gait                  Stairs            Wheelchair  Mobility    Modified Rankin (Stroke Patients Only)       Balance Overall balance assessment: Needs assistance Sitting-balance support: Feet supported, No upper extremity supported Sitting balance-Leahy Scale: Fair                                       Pertinent  Vitals/Pain Pain Assessment Pain Assessment: 0-10 Pain Score: 4  Pain Location: R leg Pain Descriptors / Indicators: Discomfort, Sore Pain Intervention(s): Limited activity within patient's tolerance, Monitored during session, Premedicated before session, Repositioned, Ice applied    Home Living Family/patient expects to be discharged to:: Private residence Living Arrangements: Children Available Help at Discharge: Family;Available 24 hours/day Type of Home: House Home Access: Ramped entrance       Home Layout: One level Home Equipment: Rollator (4 wheels);Cane - single point;Shower seat      Prior Function Prior Level of Function : Independent/Modified Independent;History of Falls (last six months)             Mobility Comments: 2-3 falls over the past 6 months. patient uses rollator or cane intermittently as needed secondary to balance issues and RA ADLs Comments: independent     Hand Dominance        Extremity/Trunk Assessment   Upper Extremity Assessment Upper Extremity Assessment: Overall WFL for tasks assessed (patient has RA; endurance likely impaired for sustianed activity)    Lower Extremity Assessment Lower Extremity Assessment: RLE deficits/detail (LLE history of peripherial neuropathy) RLE Deficits / Details: patient able to activate hip/knee/ankle movement. AROM limited by pain RLE Sensation: history of peripheral neuropathy       Communication   Communication: No difficulties  Cognition Arousal/Alertness: Awake/alert Behavior During Therapy: WFL for tasks assessed/performed Overall Cognitive Status: Within Functional Limits for tasks assessed                                          General Comments      Exercises General Exercises - Lower Extremity Ankle Circles/Pumps: AROM, Strengthening, Right, 10 reps, Supine Quad Sets: AROM, Strengthening, Right, 5 reps, Supine Heel Slides: AAROM, Strengthening, Right, 5 reps,  Supine Straight Leg Raises: AROM, Strengthening, Right, 5 reps, Supine Other Exercises Other Exercises: verbal cues for exercise technique for strengthening   Assessment/Plan    PT Assessment Patient needs continued PT services  PT Problem List Decreased strength;Decreased range of motion;Decreased activity tolerance;Decreased balance;Decreased mobility;Decreased safety awareness;Decreased knowledge of use of DME;Pain;Decreased knowledge of precautions       PT Treatment Interventions DME instruction;Gait training;Stair training;Functional mobility training;Therapeutic activities;Therapeutic exercise;Balance training;Neuromuscular re-education;Cognitive remediation;Patient/family education;Wheelchair mobility training    PT Goals (Current goals can be found in the Care Plan section)  Acute Rehab PT Goals Patient Stated Goal: to regain independence PT Goal Formulation: With patient/family Time For Goal Achievement: 06/30/22 Potential to Achieve Goals: Good    Frequency Min 5X/week     Co-evaluation               AM-PAC PT "6 Clicks" Mobility  Outcome Measure Help needed turning from your back to your side while in a flat bed without using bedrails?: A Lot Help needed moving from lying on your back to sitting on the side of a flat bed without using bedrails?: A Lot Help needed moving to and from  a bed to a chair (including a wheelchair)?: A Lot Help needed standing up from a chair using your arms (e.g., wheelchair or bedside chair)?: A Lot Help needed to walk in hospital room?: A Lot Help needed climbing 3-5 steps with a railing? : Total 6 Click Score: 11    End of Session Equipment Utilized During Treatment: Gait belt Activity Tolerance: Patient tolerated treatment well;Patient limited by pain Patient left: in bed;with call bell/phone within reach;with family/visitor present Nurse Communication: Mobility status PT Visit Diagnosis: Muscle weakness (generalized)  (M62.81);Difficulty in walking, not elsewhere classified (R26.2)    Time: 3154-0086 PT Time Calculation (min) (ACUTE ONLY): 53 min   Charges:   PT Evaluation $PT Eval Moderate Complexity: 1 Mod          Minna Merritts, PT, MPT   Percell Locus 06/23/2022, 12:15 PM

## 2022-06-23 NOTE — Op Note (Signed)
06/22/2022  10:42 AM  PATIENT:  [name]  71 y.o. female  PRE-OPERATIVE DIAGNOSIS:  RIGHT PERIPROSTHETIC SUPRACONDYLAR FEMUR FRACTURE  POST-OPERATIVE DIAGNOSIS:  RIGHT PERIPROSTHETIC SUPRACONDYLAR FEMUR FRACTURE  PROCEDURE:  OPEN REDUCTION INTERNAL FIXATION OF RIGHT PERIPROSTHETIC FEMUR FRACTURE WITH BIOMET NCB PLATE  SURGEON:  Altamese Alma, MD  PHYSICIAN ASSISTANT: Ainsley Spinner, PA-C  ANESTHESIA:   GENERAL  EBL: 120 CC  SPECIMEN:  No Specimen  TOURNIQUET:  NONE  COMPLICATIONS: NONE  DICTATION: Note written in EPIC  DISPOSITION: TO PACU  CONDITION: STABLE  DELAY START OF DVT PROPHYLAXIS BECAUSE OF BLEEDING RISK: NO  BRIEF SUMMARY OF INDICATION FOR PROCEDURE:  [name] is a 71 y.o. who sustained a comminuted supracondylar femur fracture, which was quite distal and near to the femoral implant, in ground level fall. The risks and benefits of surgery were discussed with the patient and family, including the possibility of infection, nerve injury, vessel injury, wound breakdown, arthritis, symptomatic hardware, DVT/ PE, loss of motion, malunion, nonunion, heart attack, stroke, death, implant loosening, and need for further surgery among others.  These risks were acknowledged and consent provided to proceed.   BRIEF SUMMARY OF PROCEDURE:  The patient was taken to the operating room where general anesthesia was induced and after receipt of preoperative antibiotics.  The right lower extremity was prepped and draped in usual sterile fashion.  No tourniquet was used during the procedure.  A radiolucent triangle was placed underneath the femur and towel bumps to restore appropriate alignment and length. C-arm was brought in to confirm the appropriate position of the distal incision.  This was checked on lateral as well.  An incision was then made.  Dissection was carried down to the IT band.  It was split in line with the incision.  The deep protractor was placed.  Hematoma  evacuated. My assistant pulled and maintained traction and dialed in the rotation for alignment.  I placed a k wire to maintain provisional reduction then introduced the Biomet NCB plate.  I placed a pin distally parallel to the femoral component and joint line of the tibial tray and then checked the position on both AP and lateral views proximally, placing a single screw.  This was tightened while adjusting distally to make sure that proper alignment was maintained throughout. After the plate was apposed distally I then placed a drill bit for the small caliber screw in the shaft. To create a bridge construct, I then placed multiple screws in the articular block, checking their trajectory and alignment with fluoro.  Additional standard screws were placed proximally.  Locking caps were placed over the heads of the screws distally ater confirming appropriate position of all screws on orthogonal views.  Wounds were irrigated thoroughly and then closed in standard layered fashion using #1 Vicryl for the tensor, 0 Vicryl for the deep subcu, 2-0 Vicryl and 3-0 vertical mattress sutures for the skin.  A sterile gently compressive dressing was then applied with an Ace wrap from foot to thigh as well as a knee immobilizer until the patient wakes up adequately from anesthesia at which time she will be allowed unrestricted range of motion. Ainsley Spinner, PA-C, assisted me throughout as did a PA student and an assistant was necessary to obtain and maintain reduction during provisional and definitive fixation and also assisted with wound closure.   PROGNOSIS:  Because of comorbidities and increased BMI, patient is at increased risk for perioperative complications. PT/ OT to assist with progressive weightbearing  to tolerance and unrestricted range of motion of the knee without bracing.  Formal pharmacologic DVT prophylaxis with Lovenox. F/u in 10-14 days for removal of sutures.     [name]. Marcelino Scot, M.D.

## 2022-06-23 NOTE — Progress Notes (Signed)
PROGRESS NOTE  [name]:[id number] DOB: [birth year] DOA: 06/21/2022 PCP: Lorrene Reid, PA-C   LOS: 2 days   Brief Narrative / Interim history: 71 year old female with history of RA, chronic diastolic CHF, HTN, HLD, OSA comes in with a fall.  She was cleaning her house, they are looking to downsize, and apparently tripped and had a ground-level fall.  She experienced right-sided hip pain and came to the hospital.  Imaging in the ED showed a periprosthetic distal femur fracture, orthopedic surgery was consulted and we are asked to admit  Subjective / 24h Interval events: States that pain is controlled.  Mainly concerned about low oxygen levels even with the CPAP, sats apparently in the 70s last night.  She was asymptomatic.  Stable with 2 L nasal cannula  Assesement and Plan: Principal Problem:   Femur fracture ([place]) Active Problems:   OSA (obstructive sleep apnea)   Rheumatoid arthritis (HCC)   PVC's (premature ventricular contractions)   Class 2 obesity due to excess calories with body mass index (BMI) of 36.0 to 36.9 in adult   Chronic diastolic CHF (congestive heart failure) (HCC)   Chronic kidney disease, stage 3b (HCC)   DNR (do not resuscitate)  Principal problem Periprosthetic distal femur fracture-orthopedic surgery consulted, appreciate input.  She was taken to the OR by Dr. Ginette Pitman on 11/9 and she is status post ORIF with plate.  Active problems Hypoxemia-patient was noted to be in the 70s last night, while on CPAP.  I took her off oxygen this morning and she stayed in the upper 90s.  Would be good to see how her oxygen levels are when she is working with PT this afternoon.  Maybe she has a degree of atelectasis from being in bed and undergoing surgery.  Obtain a chest x-ray today  RA -Continue home medications   Chronic diastolic CHF-Echo on 4/4 with preserved EF and grade 1 DD.  Currently appears euvolemic, hold diuretics today still given soft blood  pressures  Colitis -Continue Colestid, hyoscyamine   PVCs-Continue amiodarone, Toprol XL   HTN-Continue Toprol XL   HLD-Continue fenofibrate, rosuvastatin   OSA-Continue CPAP   Stage 3b CKD-Appears to be stable at this time.  Continue to monitor    Scheduled Meds:  acetaminophen  650 mg Oral Q8H   amiodarone  200 mg Oral Daily   aspirin EC  81 mg Oral QHS   colestipol  2 g Oral QHS   docusate sodium  100 mg Oral BID   doxylamine (Sleep)  25 mg Oral QHS   enoxaparin (LOVENOX) injection  40 mg Subcutaneous Q24H   fenofibrate  54 mg Oral QHS   gabapentin  600 mg Oral BID   hydroxychloroquine  200 mg Oral Q MTWThF   leflunomide  20 mg Oral Daily   loratadine  10 mg Oral QHS   metoprolol succinate  12.5 mg Oral Daily   rosuvastatin  10 mg Oral Daily   Continuous Infusions:  methocarbamol (ROBAXIN) IV     PRN Meds:.bisacodyl, HYDROmorphone (DILAUDID) injection, hyoscyamine, methocarbamol **OR** methocarbamol (ROBAXIN) IV, metoCLOPramide **OR** metoCLOPramide (REGLAN) injection, ondansetron **OR** ondansetron (ZOFRAN) IV, oxyCODONE, polyethylene glycol, polyvinyl alcohol  Current Outpatient Medications  Medication Instructions   amiodarone (PACERONE) 200 mg, Oral, Daily   aspirin EC 81 mg, Oral, Daily at bedtime   cetirizine (ZYRTEC) 10 mg, Oral, Daily   colestipol (COLESTID) 2 g, Oral, Daily at bedtime   cycloSPORINE (RESTASIS) 0.05 % ophthalmic emulsion 2 drops, Both Eyes, 2 times  daily PRN   diphenoxylate-atropine (LOMOTIL) 2.5-0.025 MG per tablet 1 tablet, Oral, 4 times daily PRN   doxylamine (Sleep) (UNISOM) 25 mg, Oral, At bedtime PRN   fenofibrate (TRICOR) 48 mg, Oral, Daily   fluticasone (FLONASE) 50 MCG/ACT nasal spray 1 spray, Each Nare, Daily   gabapentin (NEURONTIN) 300 MG capsule TAKE 2 CAPSULES BY MOUTH 2 TIMES DAILY.   hydroxychloroquine (PLAQUENIL) 200 mg, Oral, Daily   hyoscyamine (ANASPAZ) 0.25 mg, Sublingual, Every 6 hours PRN   ibuprofen (ADVIL) 800 mg,  Oral, Every 6 hours PRN   leflunomide (ARAVA) 20 mg, Oral, Daily   lidocaine (LIDODERM) 5 % PLACE 1 PATCH ONTO THE SKIN DAILY AS NEEDED FOR PAIN. REMOVE AND DISCARD PATCH WITHIN 12 HOURS OR AS DIRECTED BY MD.   metoprolol succinate (TOPROL-XL) 12.5 mg, Oral, Daily   Multiple Vitamin (MULTIVITAMIN WITH MINERALS) TABS tablet 1 tablet, Oral, Daily   NONFORMULARY OR COMPOUNDED ITEM Triamcinolone 0.1% & Silvadene cream 1:1- Apply as directed to affected areas as needed   omeprazole (PRILOSEC) 40 mg, Oral, As needed   potassium chloride (KLOR-CON) 10 MEQ tablet 20 mEq, Oral, Daily   Probiotic Product (PROBIOTIC DAILY PO) Oral, Ultraflora IB Probiotic    promethazine (PHENERGAN) 25 MG tablet TAKE 1 TABLET BY MOUTH EVERY 6 HOURS AS NEEDED.   rosuvastatin (CRESTOR) 10 mg, Oral, Daily   Semaglutide (1 MG/DOSE) 1 mg, Injection, Weekly   spironolactone (ALDACTONE) 25 mg, Oral, Daily   Torsemide 40 mg, Oral, Daily   UNABLE TO FIND CPAP: AT bedtime; setting is "12"     Diet Orders (From admission, onward)     Start     Ordered   06/22/22 1358  Diet Heart Room service appropriate? Yes; Fluid consistency: Thin  Diet effective now       Question Answer Comment  Room service appropriate? Yes   Fluid consistency: Thin      06/22/22 1357            DVT prophylaxis: enoxaparin (LOVENOX) injection 40 mg Start: 06/23/22 0800 SCDs Start: 06/22/22 1358 SCDs Start: 06/21/22 1611   Lab Results  Component Value Date   PLT 160 06/23/2022      Code Status: DNR  Family Communication: daughter at bedside   Status is: Inpatient  Remains inpatient appropriate because: surgery today   Level of care: Med-Surg  Consultants:  Orthopedic surgery  Objective: Vitals:   06/23/22 0500 06/23/22 0605 06/23/22 0816 06/23/22 0906  BP:  (!) 108/49 (!) 109/42 (!) 100/49  Pulse:  68 71 70  Resp:  16 17   Temp:  97.7 F (36.5 C) 98.7 F (37.1 C)   TempSrc:  Axillary    SpO2:  99% 96%   Weight: 102.9  kg     Height:        Intake/Output Summary (Last 24 hours) at 06/23/2022 1118 Last data filed at 06/23/2022 0600 Gross per 24 hour  Intake 1050 ml  Output 1950 ml  Net -900 ml   Wt Readings from Last 3 Encounters:  06/23/22 102.9 kg  05/25/22 88.5 kg  05/08/22 89.8 kg    Examination:  Constitutional: NAD Eyes: lids and conjunctivae normal, no scleral icterus ENMT: mmm Neck: normal, supple Respiratory: Faint rhonchi left lung base, no wheezing, otherwise clear Cardiovascular: Regular rate and rhythm, no murmurs / rubs / gallops. No LE edema. Abdomen: soft, no distention, no tenderness. Bowel sounds positive.  Skin: no rashes Neurologic: no focal deficits, equal strength  Data Reviewed:  I have independently reviewed following labs and imaging studies   CBC Recent Labs  Lab 06/21/22 1620 06/22/22 0742 06/23/22 0344  WBC 7.8 5.9 8.2  HGB 12.0 10.9* 10.3*  HCT 37.3 33.6* 31.8*  PLT 207 164 160  MCV 88.6 89.1 88.8  MCH 28.5 28.9 28.8  MCHC 32.2 32.4 32.4  RDW 14.0 14.0 13.7     Recent Labs  Lab 06/21/22 1620 06/22/22 0742 06/23/22 0344  NA 141 138 135  K 3.6 3.9 4.2  CL 103 104 99  CO2 '25 25 26  '$ GLUCOSE 94 113* 139*  BUN 30* 26* 18  CREATININE 1.51* 1.60* 1.34*  CALCIUM 8.8* 8.6* 8.5*  AST  --   --  26  ALT  --   --  15  ALKPHOS  --   --  45  BILITOT  --   --  0.7  ALBUMIN  --   --  2.8*  MG  --   --  2.2  INR 1.1  --   --      ------------------------------------------------------------------------------------------------------------------ No results for input(s): "CHOL", "HDL", "LDLCALC", "TRIG", "CHOLHDL", "LDLDIRECT" in the last 72 hours.  Lab Results  Component Value Date   HGBA1C 5.3 03/27/2022   ------------------------------------------------------------------------------------------------------------------ No results for input(s): "TSH", "T4TOTAL", "T3FREE", "THYROIDAB" in the last 72 hours.  Invalid input(s): "FREET3"  Cardiac  Enzymes No results for input(s): "CKMB", "TROPONINI", "MYOGLOBIN" in the last 168 hours.  Invalid input(s): "CK" ------------------------------------------------------------------------------------------------------------------    Component Value Date/Time   BNP 44.4 09/26/2017 1325    CBG: No results for input(s): "GLUCAP" in the last 168 hours.  Recent Results (from the past 240 hour(s))  Surgical pcr screen     Status: None   Collection Time: 06/22/22 10:10 AM   Specimen: Nasal Mucosa; Nasal Swab  Result Value Ref Range Status   MRSA, PCR NEGATIVE NEGATIVE Final   Staphylococcus aureus NEGATIVE NEGATIVE Final    Comment: (NOTE) The Xpert SA Assay (FDA approved for NASAL specimens in patients 43 years of age and older), is one component of a comprehensive surveillance program. It is not intended to diagnose infection nor to guide or monitor treatment. Performed at [facility], [place] [address] [place], [place] [address]      Radiology Studies: DG CHEST PORT 1 VIEW  Result Date: 06/23/2022 CLINICAL DATA:  Hypoxemia status post recent ORIF distal femur fracture EXAM: PORTABLE CHEST 1 VIEW COMPARISON:  Chest radiograph dated 04/09/2020 FINDINGS: Asymmetrically decreased left lung volume. Increased hazy left lung opacity and patchy left retrocardiac opacities. No pleural effusion or pneumothorax. Similar enlarged cardiomediastinal silhouette. The visualized skeletal structures are unremarkable. IMPRESSION: Asymmetrically decreased left lung volume and increased hazy left lung opacity and patchy left retrocardiac opacities, likely atelectasis. Electronically Signed   By: Darrin Nipper M.D.   On: 06/23/2022 10:32   DG FEMUR PORT, MIN 2 VIEWS RIGHT  Result Date: 06/22/2022 CLINICAL DATA:  Fracture, postop. EXAM: PORTABLE RIGHT KNEE - 1-2 VIEW; RIGHT FEMUR PORTABLE 2 VIEW COMPARISON:  Preoperative imaging. FINDINGS: Lateral plate and multi screw fixation of distal femur fracture.  The fracture is in improved alignment with mild persistent displacement. There is no new periprosthetic lucency. Right knee arthroplasty is in place. Alignment remains congruent. Recent postsurgical change includes air and edema in the joint space and soft tissues. IMPRESSION: ORIF distal femur fracture, in improved alignment. No immediate postoperative complication. Electronically Signed   By: Keith Rake M.D.   On: 06/22/2022 15:08  DG Knee Right Port  Result Date: 06/22/2022 CLINICAL DATA:  Fracture, postop. EXAM: PORTABLE RIGHT KNEE - 1-2 VIEW; RIGHT FEMUR PORTABLE 2 VIEW COMPARISON:  Preoperative imaging. FINDINGS: Lateral plate and multi screw fixation of distal femur fracture. The fracture is in improved alignment with mild persistent displacement. There is no new periprosthetic lucency. Right knee arthroplasty is in place. Alignment remains congruent. Recent postsurgical change includes air and edema in the joint space and soft tissues. IMPRESSION: ORIF distal femur fracture, in improved alignment. No immediate postoperative complication. Electronically Signed   By: Keith Rake M.D.   On: 06/22/2022 15:08   DG FEMUR, MIN 2 VIEWS RIGHT  Result Date: 06/22/2022 CLINICAL DATA:  Elective surgery. EXAM: RIGHT FEMUR 2 VIEWS COMPARISON:  Preoperative imaging. FINDINGS: Six fluoroscopic spot views of the right femur obtained in the operating room. Lateral plate and multi screw fixation of distal femur fracture. Fluoroscopy time 40 seconds. Dose 3.95 mGy. IMPRESSION: Intraoperative fluoroscopy during distal femur fracture ORIF. Electronically Signed   By: Keith Rake M.D.   On: 06/22/2022 14:04   DG C-Arm 1-60 Min-No Report  Result Date: 06/22/2022 Fluoroscopy was utilized by the requesting physician.  No radiographic interpretation.   DG C-Arm 1-60 Min-No Report  Result Date: 06/22/2022 Fluoroscopy was utilized by the requesting physician.  No radiographic interpretation.      Marzetta Board, MD, PhD [facility]  Between 7 am - 7 pm I am available, please contact me via Amion (for emergencies) or Securechat (non urgent messages)  Between 7 pm - 7 am I am not available, please contact night coverage MD/APP via Amion

## 2022-06-23 NOTE — Evaluation (Signed)
Occupational Therapy Evaluation Patient Details Name: [name] MRN: [id number] DOB: Aug 25, 1950 Today's Date: 06/23/2022   History of Present Illness Patient is a 71 year old female with history of RA, chronic diastolic CHF, HTN, HLD, OSA comes in with a fall.  She was cleaning her house, they are looking to downsize, and apparently tripped and had a ground-level fall.  She experienced right-sided hip pain and came to the hospital.  Imaging in the ED showed a periprosthetic distal femur fracture s/p ORIF of right distance femur.  Also with recent complaints of right ankle pain also limiting weightbearing.   Clinical Impression   Pt currently unable to stand on eval secondary to right knee and ankle pain.  Attempted standing enough to replace bed pad with pt only lifting a couple inches with total assist from therapist.  Needed only min assist for scooting small scoots up to the left when transitioning back to supine position.  Mod assist for bed mobility with max assist for bed level to sitting LB selfcare.  She has 24 hr supervision from her family and will benefit from acute care OT to help progress ADL independence sit to stand.  Feel she will benefit from AIR level therapy for increased intensity and to progress to min assist level or better prior to discharge home.       Recommendations for follow up therapy are one component of a multi-disciplinary discharge planning process, led by the attending physician.  Recommendations may be updated based on patient status, additional functional criteria and insurance authorization.   Follow Up Recommendations  Acute inpatient rehab (3hours/day)    Assistance Recommended at Discharge Frequent or constant Supervision/Assistance  Patient can return home with the following A little help with walking and/or transfers;A little help with bathing/dressing/bathroom;Assistance with cooking/housework;Assist for transportation;Help with stairs or ramp for  entrance;Direct supervision/assist for medications management    Functional Status Assessment  Patient has had a recent decline in their functional status and demonstrates the ability to make significant improvements in function in a reasonable and predictable amount of time.  Equipment Recommendations  Other (comment) (TBD next venue of care)    Recommendations for Other Services Rehab consult     Precautions / Restrictions Precautions Precautions: Fall Precaution Comments: right ankle pain Restrictions Weight Bearing Restrictions: No RLE Weight Bearing: Weight bearing as tolerated Other Position/Activity Restrictions: ROM as tolerated R knee      Mobility Bed Mobility Overal bed mobility: Needs Assistance Bed Mobility: Supine to Sit, Sit to Supine     Supine to sit: Mod assist Sit to supine: Mod assist   General bed mobility comments: Assist for moving the RLE when getting out and back in bed as well as support for scooting out to the EOB once she used the bedrails to sit up.    Transfers Overall transfer level: Needs assistance Equipment used: None              Lateral/Scoot Transfers: Min assist General transfer comment: Able to scoot to the left up the EOB with min assist.  Decreased weightbearing over the RLE with decreased clearance of bottom as well.      Balance Overall balance assessment: Needs assistance Sitting-balance support: Feet supported, No upper extremity supported Sitting balance-Leahy Scale: Fair                                     ADL  either performed or assessed with clinical judgement   ADL Overall ADL's : Needs assistance/impaired Eating/Feeding: Independent;Bed level   Grooming: Wash/dry hands;Wash/dry face;Sitting Grooming Details (indicate cue type and reason): simulated Upper Body Bathing: Supervision/ safety;Sitting Upper Body Bathing Details (indicate cue type and reason): simulated         Lower Body  Dressing: Maximal assistance;Sitting/lateral leans                 General ADL Comments: Pt unable to stand this session scondary to right knee and ankle pain.  X-ray completed just prior to session but results unknown.  Pt able to complete simple scoots up toward the top of the bed with min assist with with decreased ability to clear her bottom.  Good family support available, recommend AIR consult for follow-up.     Vision Baseline Vision/History: 4 Cataracts Ability to See in Adequate Light: 0 Adequate Patient Visual Report: No change from baseline Vision Assessment?: No apparent visual deficits     Perception Perception Perception: Not tested   Praxis Praxis Praxis: Not tested    Pertinent Vitals/Pain Pain Assessment Pain Assessment: 0-10 Pain Score: 6  Pain Location: ankle and knee Pain Descriptors / Indicators: Discomfort Pain Intervention(s): Limited activity within patient's tolerance, Repositioned, Monitored during session     Hand Dominance  right   Extremity/Trunk Assessment Upper Extremity Assessment Upper Extremity Assessment: Overall WFL for tasks assessed   Lower Extremity Assessment Lower Extremity Assessment: Defer to PT evaluation RLE Deficits / Details: patient able to activate hip/knee/ankle movement. AROM limited by pain RLE Sensation: history of peripheral neuropathy   Cervical / Trunk Assessment Cervical / Trunk Assessment: Normal   Communication Communication Communication: No difficulties   Cognition Arousal/Alertness: Awake/alert Behavior During Therapy: WFL for tasks assessed/performed Overall Cognitive Status: Within Functional Limits for tasks assessed                                                  Home Living Family/patient expects to be discharged to:: Private residence Living Arrangements: Children Available Help at Discharge: Family;Available 24 hours/day Type of Home: House Home Access: Ramped  entrance     Home Layout: One level               Home Equipment: Rollator (4 wheels);Cane - single point;Shower seat          Prior Functioning/Environment Prior Level of Function : Independent/Modified Independent;History of Falls (last six months)             Mobility Comments: 2-3 falls over the past 6 months. patient uses rollator or cane intermittently as needed secondary to balance issues and RA ADLs Comments: independent        OT Problem List: Decreased knowledge of use of DME or AE;Decreased activity tolerance;Impaired balance (sitting and/or standing);Pain      OT Treatment/Interventions: Self-care/ADL training;Patient/family education;Balance training;Therapeutic activities;DME and/or AE instruction    OT Goals(Current goals can be found in the care plan section) Acute Rehab OT Goals Patient Stated Goal: Pt wants to go to inpatient rehab and get stronger. OT Goal Formulation: With patient/family Time For Goal Achievement: 07/07/22 Potential to Achieve Goals: Good  OT Frequency: Min 2X/week       AM-PAC OT "6 Clicks" Daily Activity     Outcome Measure Help from another person eating meals?: None Help  from another person taking care of personal grooming?: A Little Help from another person toileting, which includes using toliet, bedpan, or urinal?: Total Help from another person bathing (including washing, rinsing, drying)?: Total Help from another person to put on and taking off regular upper body clothing?: None Help from another person to put on and taking off regular lower body clothing?: Total 6 Click Score: 14   End of Session Nurse Communication: Need for lift equipment  Activity Tolerance: Patient tolerated treatment well Patient left: in bed;with call bell/phone within reach  OT Visit Diagnosis: Unsteadiness on feet (R26.81);Muscle weakness (generalized) (M62.81);Other abnormalities of gait and mobility (R26.89);Pain Pain - Right/Left:  Right Pain - part of body: Leg;Ankle and joints of foot                Time: 8757-9728 OT Time Calculation (min): 32 min Charges:  OT General Charges $OT Visit: 1 Visit OT Treatments $Self Care/Home Management : 8-22 mins Kaila Devries OTR/L 06/23/2022, 1:10 PM

## 2022-06-23 NOTE — Progress Notes (Signed)
Orthopaedic Trauma Service Progress Note  Patient ID: [name] MRN: [id number] DOB/AGE: 1951-07-05 71 y.o.  Subjective:  Doing well  Pain controlled No complaints  Daughter at bedside  Daughter, son-in-law and grand-daughter currently live with pt as they just moved back to the [place] from being overseas  Pt uses walker at baseline  Not opposed to SNF  Single story house Has a ramp to get into the house    ROS As above  Objective:   VITALS:   Vitals:   06/23/22 0500 06/23/22 0605 06/23/22 0816 06/23/22 0906  BP:  (!) 108/49 (!) 109/42 (!) 100/49  Pulse:  68 71 70  Resp:  16 17   Temp:  97.7 F (36.5 C) 98.7 F (37.1 C)   TempSrc:  Axillary    SpO2:  99% 96%   Weight: 102.9 kg     Height:        Estimated body mass index is 41.49 kg/m as calculated from the following:   Height as of this encounter: '5\' 2"'$  (1.575 m).   Weight as of this encounter: 102.9 kg.   Intake/Output      11/09 0701 11/10 0700 11/10 0701 11/11 0700   P.O. 250    I.V. (mL/kg) 800 (7.8)    IV Piggyback 100    Total Intake(mL/kg) 1150 (11.2)    Urine (mL/kg/hr) 1850 (0.7)    Blood 100    Total Output 1950    Net -800           LABS  Results for orders placed or performed during the hospital encounter of 06/21/22 (from the past 24 hour(s))  Comprehensive metabolic panel     Status: Abnormal   Collection Time: 06/23/22  3:44 AM  Result Value Ref Range   Sodium 135 135 - 145 mmol/L   Potassium 4.2 3.5 - 5.1 mmol/L   Chloride 99 98 - 111 mmol/L   CO2 26 22 - 32 mmol/L   Glucose, Bld 139 (H) 70 - 99 mg/dL   BUN 18 8 - 23 mg/dL   Creatinine, Ser 1.34 (H) 0.44 - 1.00 mg/dL   Calcium 8.5 (L) 8.9 - 10.3 mg/dL   Total Protein 5.4 (L) 6.5 - 8.1 g/dL   Albumin 2.8 (L) 3.5 - 5.0 g/dL   AST 26 15 - 41 U/L   ALT 15 0 - 44 U/L   Alkaline Phosphatase 45 38 - 126 U/L   Total Bilirubin 0.7 0.3 - 1.2 mg/dL    GFR, Estimated 43 (L) >60 mL/min   Anion gap 10 5 - 15  CBC     Status: Abnormal   Collection Time: 06/23/22  3:44 AM  Result Value Ref Range   WBC 8.2 4.0 - 10.5 K/uL   RBC 3.58 (L) 3.87 - 5.11 MIL/uL   Hemoglobin 10.3 (L) 12.0 - 15.0 g/dL   HCT 31.8 (L) 36.0 - 46.0 %   MCV 88.8 80.0 - 100.0 fL   MCH 28.8 26.0 - 34.0 pg   MCHC 32.4 30.0 - 36.0 g/dL   RDW 13.7 11.5 - 15.5 %   Platelets 160 150 - 400 K/uL   nRBC 0.0 0.0 - 0.2 %  Magnesium     Status: None   Collection Time: 06/23/22  3:44 AM  Result Value Ref  Range   Magnesium 2.2 1.7 - 2.4 mg/dL  VITAMIN D 25 Hydroxy (Vit-D Deficiency, Fractures)     Status: None   Collection Time: 06/23/22  3:44 AM  Result Value Ref Range   Vit D, 25-Hydroxy 41.92 30 - 100 ng/mL     PHYSICAL EXAM:   Gen: sitting up in bed, NAD, appears well Lungs: unlabored Cardiac: regular  Abd: + BS, NTND Ext:       Right Lower Extremity   Dressings c/d/I  TED hose in place  Ext warm   + DP pulse  Ankle F/E/INV/EVR intact  Sensation at baseline. She has pre-existing neuropathy   No DCT  Compartments are soft     Assessment/Plan: 1 Day Post-Op      Anti-infectives (From admission, onward)    Start     Dose/Rate Route Frequency Ordered Stop   06/22/22 2300  ceFAZolin (ANCEF) IVPB 1 g/50 mL premix        1 g 100 mL/hr over 30 Minutes Intravenous Every 12 hours 06/22/22 1357 06/22/22 2308   06/22/22 0848  hydroxychloroquine (PLAQUENIL) tablet 200 mg        200 mg Oral Every M-T-W-Th-Fr 06/21/22 1810     06/22/22 0800  ceFAZolin (ANCEF) IVPB 2g/100 mL premix        2 g 200 mL/hr over 30 Minutes Intravenous On call to O.R. 06/22/22 0755 06/22/22 1113     .  POD/HD#: 1  71 y/o female s/p fall with R periprosthetic distal femur fracture   -fall, walker dependent  - R periprosthetic distal femur fracture s/p fall   WBAT R leg  ROM as tolerated R knee  PT/OT evals  Ice and elevate for swelling and pain control  SW consult for SNF      Dressing changes starting tomorrow   PT- please teach HEP for R knee ROM- AROM, PROM. No ROM restrictions.  Quad sets, SLR, LAQ, SAQ, heel slides, stretching  Ankle theraband program, heel cord stretching, toe towel curls, etc  No pillows under bend of knee when at rest, ok to place under heel to help work on extension. Can also use zero knee bone foam if available  - Pain management:  Multimodal  - ABL anemia/Hemodynamics  Stable  Monitor  - Medical issues   Per primary   - DVT/PE prophylaxis:  Lovenox x 30 days  - ID:   Periop abx  - Metabolic Bone Disease:  Vitamin d levels look good. However, fracture mechanism suggestive of osteoporosis  Outpatient osteoporosis clinic referral upon dc   - Activity:  As above  - Impediments to fracture healing:  Poor bone quality   Fall risk   RA meds   Diabetes    - Dispo:  Therapy evals  Pt would like to return home but not opposed to snf if needed      Jari Pigg, PA-C [phone] (C) 06/23/2022, 10:19 AM  [facility] [place] [id number] [phone] Domingo Sep (F)    After 5pm and on the weekends please log on to Amion, go to orthopaedics and the look under the [facility] Call for the provider(s) on call. You can also call our office at [phone] and then follow the prompts to be connected to the call team.  Patient ID: [name], female   DOB: 11-Oct-1950, 71 y.o.   MRN: [id number]

## 2022-06-23 NOTE — Progress Notes (Signed)
Orthopaedic Trauma Service   R ankle pain noted when working with PT  Xrays show a nondisplaced distal fibula fracture which is stable Pt can WBAT in air cast  Air cast only needs to be on when mobilizing No motion restrictions to R ankle   Jari Pigg, PA-C [phone] (C) 06/23/2022, 2:43 PM  [facility] [place] [address] [phone] [name] (F)      Patient ID: [name], female   DOB: February 18, 1951, 70 y.o.   MRN: [id number]

## 2022-06-23 NOTE — Progress Notes (Signed)
? ?  Inpatient Rehab Admissions Coordinator : ? ?Per therapy recommendations, patient was screened for CIR candidacy by Hermenia Fritcher RN MSN.  At this time patient appears to be a potential candidate for CIR. I will place a rehab consult per protocol for full assessment. Please call me with any questions. ? ?Shantae Vantol RN MSN ?Admissions Coordinator ?[phone] ?  ?

## 2022-06-23 NOTE — Progress Notes (Signed)
Initial Nutrition Assessment  DOCUMENTATION CODES:   Morbid obesity  INTERVENTION:  - Add Glucerna Shake po BID, each supplement provides 220 kcal and 10 grams of protein  - Add MVI q day.   NUTRITION DIAGNOSIS:   Increased nutrient needs related to hip fracture as evidenced by estimated needs.  GOAL:   Patient will meet greater than or equal to 90% of their needs  MONITOR:   PO intake, Supplement acceptance  REASON FOR ASSESSMENT:   Consult Hip fracture protocol  ASSESSMENT:   71 y.o. female admits related to fall. PMH includes: RA, CHF, HTN, HLD, and OSA. Pt is currently receiving medical management for femur fracture.  Meds include: colace. Labs reviewed.   Pt reports that she has been eating well and has a good appetite. She denies any weight loss. Pt states that she has even experienced a weight gain over the past month. She states that she has been taking Ozempic for several months now and that has definitely affected how much she can eat at once. RD discussed the importance of adequate protein in setting of fracture for healing. Pt agreed to Glucerna BID. RD will continue to monitor PO intakes.   NUTRITION - FOCUSED PHYSICAL EXAM:  Flowsheet Row Most Recent Value  Orbital Region No depletion  Upper Arm Region No depletion  Thoracic and Lumbar Region No depletion  Buccal Region No depletion  Temple Region No depletion  Clavicle Bone Region No depletion  Clavicle and Acromion Bone Region No depletion  Scapular Bone Region No depletion  Dorsal Hand No depletion  Patellar Region No depletion  Anterior Thigh Region No depletion  Posterior Calf Region No depletion  Edema (RD Assessment) None  Hair Reviewed  Eyes Reviewed  Mouth Reviewed  Skin Reviewed  Nails Reviewed       Diet Order:   Diet Order             Diet Heart Room service appropriate? Yes; Fluid consistency: Thin  Diet effective now                   EDUCATION NEEDS:   Education  needs have been addressed  Skin:  Skin Assessment: Skin Integrity Issues: Skin Integrity Issues:: Incisions Incisions: right leg  Last BM:  PTA  Height:   Ht Readings from Last 1 Encounters:  06/22/22 '5\' 2"'$  (1.575 m)    Weight:   Wt Readings from Last 1 Encounters:  06/23/22 102.9 kg    Ideal Body Weight:  50 kg  BMI:  Body mass index is 41.49 kg/m.  Estimated Nutritional Needs:   Kcal:  2058-2260 kcals  Protein:  100-115 gm  Fluid:  >/= 2 L  Thalia Bloodgood, RD, LDN, CNSC.

## 2022-06-23 NOTE — Plan of Care (Signed)
  Problem: Education: Goal: Knowledge of General Education information will improve Description: Including pain rating scale, medication(s)/side effects and non-pharmacologic comfort measures Outcome: Not Progressing   Problem: Health Behavior/Discharge Planning: Goal: Ability to manage health-related needs will improve Outcome: Not Progressing   Problem: Clinical Measurements: Goal: Ability to maintain clinical measurements within normal limits will improve Outcome: Not Progressing Goal: Will remain free from infection Outcome: Not Progressing Goal: Diagnostic test results will improve Outcome: Not Progressing Goal: Respiratory complications will improve Outcome: Not Progressing Goal: Cardiovascular complication will be avoided Outcome: Not Progressing   Problem: Clinical Measurements: Goal: Ability to maintain clinical measurements within normal limits will improve Outcome: Not Progressing Goal: Will remain free from infection Outcome: Not Progressing Goal: Diagnostic test results will improve Outcome: Not Progressing Goal: Respiratory complications will improve Outcome: Not Progressing Goal: Cardiovascular complication will be avoided Outcome: Not Progressing   Problem: Activity: Goal: Risk for activity intolerance will decrease Outcome: Not Progressing   Problem: Nutrition: Goal: Adequate nutrition will be maintained Outcome: Not Progressing   Problem: Coping: Goal: Level of anxiety will decrease Outcome: Not Progressing   Problem: Elimination: Goal: Will not experience complications related to bowel motility Outcome: Not Progressing Goal: Will not experience complications related to urinary retention Outcome: Not Progressing

## 2022-06-24 DIAGNOSIS — G4733 Obstructive sleep apnea (adult) (pediatric): Secondary | ICD-10-CM | POA: Diagnosis not present

## 2022-06-24 DIAGNOSIS — I5032 Chronic diastolic (congestive) heart failure: Secondary | ICD-10-CM | POA: Diagnosis not present

## 2022-06-24 DIAGNOSIS — N1832 Chronic kidney disease, stage 3b: Secondary | ICD-10-CM | POA: Diagnosis not present

## 2022-06-24 DIAGNOSIS — S7291XD Unspecified fracture of right femur, subsequent encounter for closed fracture with routine healing: Secondary | ICD-10-CM | POA: Diagnosis not present

## 2022-06-24 LAB — BASIC METABOLIC PANEL
Anion gap: 5 (ref 5–15)
BUN: 20 mg/dL (ref 8–23)
CO2: 26 mmol/L (ref 22–32)
Calcium: 8.3 mg/dL — ABNORMAL LOW (ref 8.9–10.3)
Chloride: 106 mmol/L (ref 98–111)
Creatinine, Ser: 1.28 mg/dL — ABNORMAL HIGH (ref 0.44–1.00)
GFR, Estimated: 45 mL/min — ABNORMAL LOW (ref >60)
Glucose, Bld: 122 mg/dL — ABNORMAL HIGH (ref 70–99)
Potassium: 4.3 mmol/L (ref 3.5–5.1)
Sodium: 137 mmol/L (ref 135–145)

## 2022-06-24 LAB — CBC
HCT: 30.4 % — ABNORMAL LOW (ref 36.0–46.0)
Hemoglobin: 9.5 g/dL — ABNORMAL LOW (ref 12.0–15.0)
MCH: 28.4 pg (ref 26.0–34.0)
MCHC: 31.3 g/dL (ref 30.0–36.0)
MCV: 90.7 fL (ref 80.0–100.0)
Platelets: 162 10*3/uL (ref 150–400)
RBC: 3.35 MIL/uL — ABNORMAL LOW (ref 3.87–5.11)
RDW: 13.8 % (ref 11.5–15.5)
WBC: 6.7 10*3/uL (ref 4.0–10.5)
nRBC: 0 % (ref 0.0–0.2)

## 2022-06-24 MED ORDER — AMOXICILLIN-POT CLAVULANATE 875-125 MG PO TABS: 1.0000 | ORAL_TABLET | Freq: Two times a day (BID) | ORAL | Status: DC

## 2022-06-24 MED ORDER — AMOXICILLIN-POT CLAVULANATE 875-125 MG PO TABS
1.0000 | ORAL_TABLET | Freq: Two times a day (BID) | ORAL | Status: AC
  Administered 2022-06-24 – 2022-06-26 (×6): 1 via ORAL
  Filled 2022-06-24 (×6): qty 1

## 2022-06-24 NOTE — Progress Notes (Signed)
IP rehab admissions - I met with patient and her daughter at the bedside.  I explained inpatient rehab and I gave them rehab brochures.  Patient has H[employer] insurance so we would need authorization for admission to CIR.  I will have my partners follow up on Monday for plans.  Call me for questions.  [phone]

## 2022-06-24 NOTE — Progress Notes (Signed)
Orthopaedic Trauma Service Progress Note  Patient ID: [name] MRN: [id number] DOB/AGE: 70-Sep-1952 70 y.o.  Subjective:  Doing well Sitting in chair Pain controlled No R ankle pain today    ROS As above  Objective:   VITALS:   Vitals:   06/23/22 1953 06/24/22 0411 06/24/22 0813 06/24/22 1116  BP: 99/63 (!) 106/55 110/64 (!) 104/52  Pulse: 74 63 61 70  Resp: 17 15    Temp: 99.9 F (37.7 C) 98 F (36.7 C) 98.3 F (36.8 C) 98.1 F (36.7 C)  TempSrc: Oral   Oral  SpO2: 93% 92%  95%  Weight:      Height:        Estimated body mass index is 41.49 kg/m as calculated from the following:   Height as of this encounter: '5\' 2"'$  (1.575 m).   Weight as of this encounter: 102.9 kg.   Intake/Output      11/10 0701 11/11 0700 11/11 0701 11/12 0700   P.O. 240    I.V. (mL/kg)     IV Piggyback     Total Intake(mL/kg) 240 (2.3)    Urine (mL/kg/hr) 2200 (0.9)    Blood     Total Output 2200    Net -1960           LABS  Results for orders placed or performed during the hospital encounter of 06/21/22 (from the past 24 hour(s))  CBC     Status: Abnormal   Collection Time: 06/24/22  1:55 AM  Result Value Ref Range   WBC 6.7 4.0 - 10.5 K/uL   RBC 3.35 (L) 3.87 - 5.11 MIL/uL   Hemoglobin 9.5 (L) 12.0 - 15.0 g/dL   HCT 30.4 (L) 36.0 - 46.0 %   MCV 90.7 80.0 - 100.0 fL   MCH 28.4 26.0 - 34.0 pg   MCHC 31.3 30.0 - 36.0 g/dL   RDW 13.8 11.5 - 15.5 %   Platelets 162 150 - 400 K/uL   nRBC 0.0 0.0 - 0.2 %  Basic metabolic panel     Status: Abnormal   Collection Time: 06/24/22  1:55 AM  Result Value Ref Range   Sodium 137 135 - 145 mmol/L   Potassium 4.3 3.5 - 5.1 mmol/L   Chloride 106 98 - 111 mmol/L   CO2 26 22 - 32 mmol/L   Glucose, Bld 122 (H) 70 - 99 mg/dL   BUN 20 8 - 23 mg/dL   Creatinine, Ser 1.28 (H) 0.44 - 1.00 mg/dL   Calcium 8.3 (L) 8.9 - 10.3 mg/dL   GFR, Estimated 45 (L) >60  mL/min   Anion gap 5 5 - 15     PHYSICAL EXAM:   Gen: sitting up in chair, NAD, appears well Lungs: unlabored Cardiac: regular  Abd: + BS, NTND Ext:       Right Lower Extremity              Dressings c/d/I             TED hose in place  Air cast R ankle fitting well             Ext warm              + DP pulse  Ankle F/E/INV/EVR intact             Sensation at baseline. She has pre-existing neuropathy              No DCT             Compartments are soft     Assessment/Plan: 2 Days Post-Op   Principal Problem:   Femur fracture (HCC) Active Problems:   OSA (obstructive sleep apnea)   Rheumatoid arthritis (HCC)   PVC's (premature ventricular contractions)   Class 2 obesity due to excess calories with body mass index (BMI) of 36.0 to 36.9 in adult   Chronic diastolic CHF (congestive heart failure) ([place])   Chronic kidney disease, stage 3b ([place])   DNR (do not resuscitate)   Anti-infectives (From admission, onward)    Start     Dose/Rate Route Frequency Ordered Stop   06/24/22 1030  amoxicillin-clavulanate (AUGMENTIN) 875-125 MG per tablet 1 tablet  Status:  Discontinued        1 tablet Oral Every 12 hours 06/24/22 0937 06/24/22 0937   06/24/22 1030  amoxicillin-clavulanate (AUGMENTIN) 875-125 MG per tablet 1 tablet        1 tablet Oral Every 12 hours 06/24/22 0937 06/27/22 0959   06/22/22 2300  ceFAZolin (ANCEF) IVPB 1 g/50 mL premix        1 g 100 mL/hr over 30 Minutes Intravenous Every 12 hours 06/22/22 1357 06/22/22 2308   06/22/22 0848  hydroxychloroquine (PLAQUENIL) tablet 200 mg        200 mg Oral Every M-T-W-Th-Fr 06/21/22 1810     06/22/22 0800  ceFAZolin (ANCEF) IVPB 2g/100 mL premix        2 g 200 mL/hr over 30 Minutes Intravenous On call to O.R. 06/22/22 0755 06/22/22 1113     .  POD/HD#: 2  71 y/o female s/p fall with R periprosthetic distal femur fracture    -fall, walker dependent   - R periprosthetic distal femur fracture s/p fall               WBAT R leg             ROM as tolerated R knee             PT/OT evals             Ice and elevate for swelling and pain control             SW consult for SNF                           Dressing changes as needed   PT- please teach HEP for R knee ROM- AROM, PROM. No ROM restrictions.  Quad sets, SLR, LAQ, SAQ, heel slides, stretching   Ankle theraband program, heel cord stretching, toe towel curls, etc   No pillows under bend of knee when at rest, ok to place under heel to help work on extension. Can also use zero knee bone foam if available  - R distal fibula fracture  Non-op   WBAT in air cast   Only needs to be on when mobilizing   - Pain management:             Multimodal   - ABL anemia/Hemodynamics             Stable             Monitor   - Medical  issues              Per primary    - DVT/PE prophylaxis:             Lovenox x 30 days   - ID:              Periop abx   - Metabolic Bone Disease:             Vitamin d levels look good. However, fracture mechanism suggestive of osteoporosis             Outpatient osteoporosis clinic referral upon dc    - Activity:             As above   - Impediments to fracture healing:             Poor bone quality              Fall risk              RA meds              Diabetes      - Dispo:             Therapies             Pt would like to return home but not opposed to snf if needed    Jari Pigg, PA-C [phone] (C) 06/24/2022, 11:30 AM  [facility] [place] [id number] [phone] Domingo Sep (F)    After 5pm and on the weekends please log on to Amion, go to orthopaedics and the look under the [facility] Call for the provider(s) on call. You can also call our office at [phone] and then follow the prompts to be connected to the call team.  Patient ID: [name], female   DOB: [birth year], 71 y.o.   MRN: [id number]

## 2022-06-24 NOTE — Progress Notes (Signed)
PROGRESS NOTE  [name] QQP:[id number] DOB: 1951/05/30 DOA: 06/21/2022 PCP: Lorrene Reid, PA-C   LOS: 3 days   Brief Narrative / Interim history: 71 year old female with history of RA, chronic diastolic CHF, HTN, HLD, OSA comes in with a fall.  She was cleaning her house, they are looking to downsize, and apparently tripped and had a ground-level fall.  She experienced right-sided hip pain and came to the hospital.  Imaging in the ED showed a periprosthetic distal femur fracture, orthopedic surgery was consulted and we are asked to admit  Subjective / 24h Interval events: Overall doing well this morning.  She denies any shortness of breath.  Assesement and Plan: Principal Problem:   Femur fracture (HCC) Active Problems:   OSA (obstructive sleep apnea)   Rheumatoid arthritis (HCC)   PVC's (premature ventricular contractions)   Class 2 obesity due to excess calories with body mass index (BMI) of 36.0 to 36.9 in adult   Chronic diastolic CHF (congestive heart failure) (HCC)   Chronic kidney disease, stage 3b (HCC)   DNR (do not resuscitate)  Principal problem Periprosthetic distal femur fracture-orthopedic surgery consulted, appreciate input.  She was taken to the OR by Dr. Ginette Pitman on 11/9 and she is status post ORIF with plate.  PT recommends CIR  Active problems Hypoxemia, concern for CAP-patient was noted to be in the 70s tonight after the surgery, while on CPAP.   Maybe she has a degree of atelectasis from being in bed and undergoing surgery.  Chest x-ray shows possible left lower lobe infiltrate, she did have a low-grade temp of 99.9 last night.  Definitely is at risk for developing pneumonia.  Short course of Augmentin for 3 days  RA -Continue home medications   Chronic diastolic CHF-Echo on 4/4 with preserved EF and grade 1 DD.  Currently appears euvolemic, blood pressure still soft, hold diuretics  Colitis -Continue Colestid, hyoscyamine   PVCs-Continue amiodarone,  Toprol XL   HTN-Continue Toprol XL   HLD-Continue fenofibrate, rosuvastatin   OSA-Continue CPAP   Stage 3b CKD-Appears to be stable at this time.  Continue to monitor    Scheduled Meds:  acetaminophen  650 mg Oral Q8H   amiodarone  200 mg Oral Daily   amoxicillin-clavulanate  1 tablet Oral Q12H   aspirin EC  81 mg Oral QHS   colestipol  2 g Oral QHS   docusate sodium  100 mg Oral BID   doxylamine (Sleep)  25 mg Oral QHS   enoxaparin (LOVENOX) injection  40 mg Subcutaneous Q24H   feeding supplement (GLUCERNA SHAKE)  237 mL Oral BID BM   fenofibrate  54 mg Oral QHS   gabapentin  600 mg Oral BID   hydroxychloroquine  200 mg Oral Q MTWThF   leflunomide  20 mg Oral Daily   loratadine  10 mg Oral QHS   metoprolol succinate  12.5 mg Oral Daily   multivitamin with minerals  1 tablet Oral Daily   rosuvastatin  10 mg Oral Daily   Continuous Infusions:  methocarbamol (ROBAXIN) IV     PRN Meds:.bisacodyl, HYDROmorphone (DILAUDID) injection, hyoscyamine, methocarbamol **OR** methocarbamol (ROBAXIN) IV, metoCLOPramide **OR** metoCLOPramide (REGLAN) injection, ondansetron **OR** ondansetron (ZOFRAN) IV, oxyCODONE, polyethylene glycol, polyvinyl alcohol  Current Outpatient Medications  Medication Instructions   amiodarone (PACERONE) 200 mg, Oral, Daily   aspirin EC 81 mg, Oral, Daily at bedtime   cetirizine (ZYRTEC) 10 mg, Oral, Daily   colestipol (COLESTID) 2 g, Oral, Daily at bedtime   cycloSPORINE (  RESTASIS) 0.05 % ophthalmic emulsion 2 drops, Both Eyes, 2 times daily PRN   diphenoxylate-atropine (LOMOTIL) 2.5-0.025 MG per tablet 1 tablet, Oral, 4 times daily PRN   doxylamine (Sleep) (UNISOM) 25 mg, Oral, At bedtime PRN   fenofibrate (TRICOR) 48 mg, Oral, Daily   fluticasone (FLONASE) 50 MCG/ACT nasal spray 1 spray, Each Nare, Daily   gabapentin (NEURONTIN) 300 MG capsule TAKE 2 CAPSULES BY MOUTH 2 TIMES DAILY.   hydroxychloroquine (PLAQUENIL) 200 mg, Oral, Daily   hyoscyamine  (ANASPAZ) 0.25 mg, Sublingual, Every 6 hours PRN   ibuprofen (ADVIL) 800 mg, Oral, Every 6 hours PRN   leflunomide (ARAVA) 20 mg, Oral, Daily   lidocaine (LIDODERM) 5 % PLACE 1 PATCH ONTO THE SKIN DAILY AS NEEDED FOR PAIN. REMOVE AND DISCARD PATCH WITHIN 12 HOURS OR AS DIRECTED BY MD.   metoprolol succinate (TOPROL-XL) 12.5 mg, Oral, Daily   Multiple Vitamin (MULTIVITAMIN WITH MINERALS) TABS tablet 1 tablet, Oral, Daily   NONFORMULARY OR COMPOUNDED ITEM Triamcinolone 0.1% & Silvadene cream 1:1- Apply as directed to affected areas as needed   omeprazole (PRILOSEC) 40 mg, Oral, As needed   potassium chloride (KLOR-CON) 10 MEQ tablet 20 mEq, Oral, Daily   Probiotic Product (PROBIOTIC DAILY PO) Oral, Ultraflora IB Probiotic    promethazine (PHENERGAN) 25 MG tablet TAKE 1 TABLET BY MOUTH EVERY 6 HOURS AS NEEDED.   rosuvastatin (CRESTOR) 10 mg, Oral, Daily   Semaglutide (1 MG/DOSE) 1 mg, Injection, Weekly   spironolactone (ALDACTONE) 25 mg, Oral, Daily   Torsemide 40 mg, Oral, Daily   UNABLE TO FIND CPAP: AT bedtime; setting is "12"     Diet Orders (From admission, onward)     Start     Ordered   06/22/22 1358  Diet Heart Room service appropriate? Yes; Fluid consistency: Thin  Diet effective now       Question Answer Comment  Room service appropriate? Yes   Fluid consistency: Thin      06/22/22 1357            DVT prophylaxis: enoxaparin (LOVENOX) injection 40 mg Start: 06/23/22 0800 SCDs Start: 06/22/22 1358 SCDs Start: 06/21/22 1611   Lab Results  Component Value Date   PLT 162 06/24/2022      Code Status: DNR  Family Communication: daughter at bedside   Status is: Inpatient  Remains inpatient appropriate because: surgery today   Level of care: Med-Surg  Consultants:  Orthopedic surgery  Objective: Vitals:   06/23/22 1953 06/24/22 0411 06/24/22 0813 06/24/22 1116  BP: 99/63 (!) 106/55 110/64 (!) 104/52  Pulse: 74 63 61 70  Resp: 17 15    Temp: 99.9 F (37.7  C) 98 F (36.7 C) 98.3 F (36.8 C) 98.1 F (36.7 C)  TempSrc: Oral   Oral  SpO2: 93% 92%  95%  Weight:      Height:        Intake/Output Summary (Last 24 hours) at 06/24/2022 1335 Last data filed at 06/23/2022 2252 Gross per 24 hour  Intake 240 ml  Output 2200 ml  Net -1960 ml    Wt Readings from Last 3 Encounters:  06/23/22 102.9 kg  05/25/22 88.5 kg  05/08/22 89.8 kg    Examination:  Constitutional: NAD Eyes: lids and conjunctivae normal, no scleral icterus ENMT: mmm Neck: normal, supple Respiratory: Faint rhonchi in the left lung base, no wheezing Cardiovascular: Regular rate and rhythm, no murmurs / rubs / gallops.  Trace edema Abdomen: soft, no distention, no tenderness.  Bowel sounds positive.  Skin: no rashes Neurologic: no focal deficits, equal strength  Data Reviewed: I have independently reviewed following labs and imaging studies   CBC Recent Labs  Lab 06/21/22 1620 06/22/22 0742 06/23/22 0344 06/24/22 0155  WBC 7.8 5.9 8.2 6.7  HGB 12.0 10.9* 10.3* 9.5*  HCT 37.3 33.6* 31.8* 30.4*  PLT 207 164 160 162  MCV 88.6 89.1 88.8 90.7  MCH 28.5 28.9 28.8 28.4  MCHC 32.2 32.4 32.4 31.3  RDW 14.0 14.0 13.7 13.8     Recent Labs  Lab 06/21/22 1620 06/22/22 0742 06/23/22 0344 06/24/22 0155  NA 141 138 135 137  K 3.6 3.9 4.2 4.3  CL 103 104 99 106  CO2 '25 25 26 26  '$ GLUCOSE 94 113* 139* 122*  BUN 30* 26* 18 20  CREATININE 1.51* 1.60* 1.34* 1.28*  CALCIUM 8.8* 8.6* 8.5* 8.3*  AST  --   --  26  --   ALT  --   --  15  --   ALKPHOS  --   --  45  --   BILITOT  --   --  0.7  --   ALBUMIN  --   --  2.8*  --   MG  --   --  2.2  --   INR 1.1  --   --   --      ------------------------------------------------------------------------------------------------------------------ No results for input(s): "CHOL", "HDL", "LDLCALC", "TRIG", "CHOLHDL", "LDLDIRECT" in the last 72 hours.  Lab Results  Component Value Date   HGBA1C 5.3 03/27/2022    ------------------------------------------------------------------------------------------------------------------ No results for input(s): "TSH", "T4TOTAL", "T3FREE", "THYROIDAB" in the last 72 hours.  Invalid input(s): "FREET3"  Cardiac Enzymes No results for input(s): "CKMB", "TROPONINI", "MYOGLOBIN" in the last 168 hours.  Invalid input(s): "CK" ------------------------------------------------------------------------------------------------------------------    Component Value Date/Time   BNP 44.4 09/26/2017 1325    CBG: No results for input(s): "GLUCAP" in the last 168 hours.  Recent Results (from the past 240 hour(s))  Surgical pcr screen     Status: None   Collection Time: 06/22/22 10:10 AM   Specimen: Nasal Mucosa; Nasal Swab  Result Value Ref Range Status   MRSA, PCR NEGATIVE NEGATIVE Final   Staphylococcus aureus NEGATIVE NEGATIVE Final    Comment: (NOTE) The Xpert SA Assay (FDA approved for NASAL specimens in patients 102 years of age and older), is one component of a comprehensive surveillance program. It is not intended to diagnose infection nor to guide or monitor treatment. Performed at [facility], [place] [address] [place], [place] [address]   Urine Culture     Status: None   Collection Time: 06/22/22 12:41 PM   Specimen: Urine, Clean Catch  Result Value Ref Range Status   Specimen Description URINE, CLEAN CATCH  Final   Special Requests Normal  Final   Culture   Final    NO GROWTH Performed at [facility], [place] [address] [place], [place] [address]    Report Status 06/23/2022 FINAL  Final     Radiology Studies: No results found.   Marzetta Board, MD, PhD [facility]  Between 7 am - 7 pm I am available, please contact me via Amion (for emergencies) or Securechat (non urgent messages)  Between 7 pm - 7 am I am not available, please contact night coverage MD/APP via Amion

## 2022-06-24 NOTE — Progress Notes (Signed)
Physical Therapy Treatment Patient Details Name: [name] MRN: [id number] DOB: 05/31/1951 Today's Date: 06/24/2022   History of Present Illness Patient is a 71 year old female with history of RA, chronic diastolic CHF, HTN, HLD, OSA comes in with a fall.  She was cleaning her house, they are looking to downsize, and apparently tripped and had a ground-level fall.  She experienced right-sided hip pain and came to the hospital.  Imaging in the ED showed a periprosthetic distal femur fracture s/p ORIF of right distance femur.  Also with recent complaints of right ankle pain also limiting weightbearing.    PT Comments    Pt was able to progress OOB to [facility] and recliner chair today. She is anxious about mobility but willing to push through with encouragement to participate with therapy.  AIR coordinator arrived at end of session. Current plan remains appropriate. Will continue to follow acutely.    Recommendations for follow up therapy are one component of a multi-disciplinary discharge planning process, led by the attending physician.  Recommendations may be updated based on patient status, additional functional criteria and insurance authorization.  Follow Up Recommendations  Acute inpatient rehab (3hours/day)     Assistance Recommended at Discharge Intermittent Supervision/Assistance  Patient can return home with the following A lot of help with walking and/or transfers;A little help with bathing/dressing/bathroom;Assist for transportation;Help with stairs or ramp for entrance   Equipment Recommendations  Wheelchair (measurements PT);BSC/3in1 (if going home)    Recommendations for Other Services       Precautions / Restrictions Precautions Precautions: Fall Required Braces or Orthoses: Other Brace Other Brace: Air Cast on R ankle Restrictions Weight Bearing Restrictions: No RLE Weight Bearing: Weight bearing as tolerated Other Position/Activity Restrictions: ROM as tolerated R  knee and ankle     Mobility  Bed Mobility Overal bed mobility: Needs Assistance Bed Mobility: Supine to Sit     Supine to sit: Min assist     General bed mobility comments: min A for LE initiation and trunk stability. Able to scoot forward with use of bed rails.    Transfers Overall transfer level: Needs assistance Equipment used: None Transfers: Sit to/from Stand, Bed to chair/wheelchair/BSC Sit to Stand: Mod assist, Min assist Stand pivot transfers: Mod assist, Min assist         General transfer comment: initial stand performed via face to face with HHA as pt was unable to power up from bed with RW. Once standing pt was able to transition to RW to perform small step pivot to [facility]. Second attempt to stand pt retuired min A using arm rests from [facility] and RW for support. Min A required for SPT to recliner chair.    Ambulation/Gait                   Stairs             Wheelchair Mobility    Modified Rankin (Stroke Patients Only)       Balance Overall balance assessment: Needs assistance Sitting-balance support: Feet supported, No upper extremity supported Sitting balance-Leahy Scale: Fair                                      Cognition Arousal/Alertness: Awake/alert Behavior During Therapy: WFL for tasks assessed/performed, Anxious Overall Cognitive Status: Within Functional Limits for tasks assessed  General Comments: anxious about movement and requiring constant encouragement. Even after performing 2 transfers, she continued to beat herself up for not doing enough.        Exercises      General Comments        Pertinent Vitals/Pain Pain Assessment Pain Assessment: 0-10 Pain Score: 4  Pain Location: ankle and knee Pain Descriptors / Indicators: Discomfort Pain Intervention(s): Monitored during session, Limited activity within patient's tolerance, Repositioned    Home Living                           Prior Function            PT Goals (current goals can now be found in the care plan section) Acute Rehab PT Goals Patient Stated Goal: to regain independence PT Goal Formulation: With patient/family Time For Goal Achievement: 06/30/22 Potential to Achieve Goals: Good Progress towards PT goals: Progressing toward goals    Frequency    Min 5X/week      PT Plan Current plan remains appropriate    Co-evaluation              AM-PAC PT "6 Clicks" Mobility   Outcome Measure  Help needed turning from your back to your side while in a flat bed without using bedrails?: A Little Help needed moving from lying on your back to sitting on the side of a flat bed without using bedrails?: A Little Help needed moving to and from a bed to a chair (including a wheelchair)?: A Lot Help needed standing up from a chair using your arms (e.g., wheelchair or bedside chair)?: A Lot Help needed to walk in hospital room?: A Lot Help needed climbing 3-5 steps with a railing? : Total 6 Click Score: 13    End of Session Equipment Utilized During Treatment: Gait belt Activity Tolerance: Patient tolerated treatment well Patient left: with call bell/phone within reach;with family/visitor present;in chair Nurse Communication: Mobility status PT Visit Diagnosis: Muscle weakness (generalized) (M62.81);Difficulty in walking, not elsewhere classified (R26.2)     Time: 8115-7262 PT Time Calculation (min) (ACUTE ONLY): 38 min  Charges:  $Therapeutic Activity: 38-52 mins                     Benjiman Core, PTA Acute Rehab  Allena Katz 06/24/2022, 10:40 AM

## 2022-06-24 NOTE — Plan of Care (Signed)

## 2022-06-25 DIAGNOSIS — S7291XD Unspecified fracture of right femur, subsequent encounter for closed fracture with routine healing: Secondary | ICD-10-CM | POA: Diagnosis not present

## 2022-06-25 DIAGNOSIS — I5032 Chronic diastolic (congestive) heart failure: Secondary | ICD-10-CM | POA: Diagnosis not present

## 2022-06-25 DIAGNOSIS — G4733 Obstructive sleep apnea (adult) (pediatric): Secondary | ICD-10-CM | POA: Diagnosis not present

## 2022-06-25 DIAGNOSIS — N1832 Chronic kidney disease, stage 3b: Secondary | ICD-10-CM | POA: Diagnosis not present

## 2022-06-25 LAB — CBC
HCT: 29.3 % — ABNORMAL LOW (ref 36.0–46.0)
Hemoglobin: 9.5 g/dL — ABNORMAL LOW (ref 12.0–15.0)
MCH: 29 pg (ref 26.0–34.0)
MCHC: 32.4 g/dL (ref 30.0–36.0)
MCV: 89.3 fL (ref 80.0–100.0)
Platelets: 176 10*3/uL (ref 150–400)
RBC: 3.28 MIL/uL — ABNORMAL LOW (ref 3.87–5.11)
RDW: 13.8 % (ref 11.5–15.5)
WBC: 5.9 10*3/uL (ref 4.0–10.5)
nRBC: 0 % (ref 0.0–0.2)

## 2022-06-25 LAB — BASIC METABOLIC PANEL
Anion gap: 7 (ref 5–15)
BUN: 23 mg/dL (ref 8–23)
CO2: 26 mmol/L (ref 22–32)
Calcium: 8.2 mg/dL — ABNORMAL LOW (ref 8.9–10.3)
Chloride: 103 mmol/L (ref 98–111)
Creatinine, Ser: 1.28 mg/dL — ABNORMAL HIGH (ref 0.44–1.00)
GFR, Estimated: 45 mL/min — ABNORMAL LOW (ref >60)
Glucose, Bld: 106 mg/dL — ABNORMAL HIGH (ref 70–99)
Potassium: 4.2 mmol/L (ref 3.5–5.1)
Sodium: 136 mmol/L (ref 135–145)

## 2022-06-25 LAB — MAGNESIUM: Magnesium: 2.4 mg/dL (ref 1.7–2.4)

## 2022-06-25 NOTE — Progress Notes (Signed)
Pt has home CPAP, no assistance needed.  

## 2022-06-25 NOTE — Progress Notes (Signed)
PROGRESS NOTE  [name] TOI:[id number] DOB: 1951/04/23 DOA: 06/21/2022 PCP: Lorrene Reid, PA-C   LOS: 4 days   Brief Narrative / Interim history: 71 year old female with history of RA, chronic diastolic CHF, HTN, HLD, OSA comes in with a fall.  She was cleaning her house, they are looking to downsize, and apparently tripped and had a ground-level fall.  She experienced right-sided hip pain and came to the hospital.  Imaging in the ED showed a periprosthetic distal femur fracture, orthopedic surgery was consulted and we are asked to admit  Subjective / 24h Interval events: No significant complaints this morning, no chest pain, no abdominal pain, no nausea or vomiting.  No shortness of breath.  Assesement and Plan: Principal Problem:   Femur fracture (HCC) Active Problems:   OSA (obstructive sleep apnea)   Rheumatoid arthritis (HCC)   PVC's (premature ventricular contractions)   Class 2 obesity due to excess calories with body mass index (BMI) of 36.0 to 36.9 in adult   Chronic diastolic CHF (congestive heart failure) (HCC)   Chronic kidney disease, stage 3b (HCC)   DNR (do not resuscitate)  Principal problem Periprosthetic distal femur fracture-orthopedic surgery consulted, appreciate input.  She was taken to the OR by Dr. Ginette Pitman on 11/9 and she is status post ORIF with plate.  PT recommends CIR to which I completely agree.  We will call for peer to peer today per insurance request  Active problems Hypoxemia, concern for CAP-patient was noted to be in the 70s tonight after the surgery, while on CPAP.   Maybe she has a degree of atelectasis from being in bed and undergoing surgery.  Chest x-ray shows possible left lower lobe infiltrate, she did have a low-grade temp of 99.9 2 nights ago.  Definitely is at risk for developing pneumonia.  Short course of Augmentin for 3 days, today's day #2  RA -Continue home medications   Chronic diastolic CHF-Echo on 4/4 with preserved EF and  grade 1 DD.  Currently appears euvolemic, blood pressure still soft, hold diuretics  Colitis -Continue Colestid, hyoscyamine   PVCs-Continue amiodarone, Toprol XL   HTN-Continue Toprol XL   HLD-Continue fenofibrate, rosuvastatin   OSA-Continue CPAP   Stage 3b CKD-Appears to be stable at this time.  Continue to monitor    Scheduled Meds:  acetaminophen  650 mg Oral Q8H   amiodarone  200 mg Oral Daily   amoxicillin-clavulanate  1 tablet Oral Q12H   aspirin EC  81 mg Oral QHS   colestipol  2 g Oral QHS   docusate sodium  100 mg Oral BID   doxylamine (Sleep)  25 mg Oral QHS   enoxaparin (LOVENOX) injection  40 mg Subcutaneous Q24H   feeding supplement (GLUCERNA SHAKE)  237 mL Oral BID BM   fenofibrate  54 mg Oral QHS   gabapentin  600 mg Oral BID   hydroxychloroquine  200 mg Oral Q MTWThF   leflunomide  20 mg Oral Daily   loratadine  10 mg Oral QHS   metoprolol succinate  12.5 mg Oral Daily   multivitamin with minerals  1 tablet Oral Daily   rosuvastatin  10 mg Oral Daily   Continuous Infusions:  methocarbamol (ROBAXIN) IV     PRN Meds:.bisacodyl, HYDROmorphone (DILAUDID) injection, hyoscyamine, methocarbamol **OR** methocarbamol (ROBAXIN) IV, metoCLOPramide **OR** metoCLOPramide (REGLAN) injection, ondansetron **OR** ondansetron (ZOFRAN) IV, oxyCODONE, polyethylene glycol, polyvinyl alcohol  Current Outpatient Medications  Medication Instructions   amiodarone (PACERONE) 200 mg, Oral, Daily  aspirin EC 81 mg, Oral, Daily at bedtime   cetirizine (ZYRTEC) 10 mg, Oral, Daily   colestipol (COLESTID) 2 g, Oral, Daily at bedtime   cycloSPORINE (RESTASIS) 0.05 % ophthalmic emulsion 2 drops, Both Eyes, 2 times daily PRN   diphenoxylate-atropine (LOMOTIL) 2.5-0.025 MG per tablet 1 tablet, Oral, 4 times daily PRN   doxylamine (Sleep) (UNISOM) 25 mg, Oral, At bedtime PRN   fenofibrate (TRICOR) 48 mg, Oral, Daily   fluticasone (FLONASE) 50 MCG/ACT nasal spray 1 spray, Each Nare,  Daily   gabapentin (NEURONTIN) 300 MG capsule TAKE 2 CAPSULES BY MOUTH 2 TIMES DAILY.   hydroxychloroquine (PLAQUENIL) 200 mg, Oral, Daily   hyoscyamine (ANASPAZ) 0.25 mg, Sublingual, Every 6 hours PRN   ibuprofen (ADVIL) 800 mg, Oral, Every 6 hours PRN   leflunomide (ARAVA) 20 mg, Oral, Daily   lidocaine (LIDODERM) 5 % PLACE 1 PATCH ONTO THE SKIN DAILY AS NEEDED FOR PAIN. REMOVE AND DISCARD PATCH WITHIN 12 HOURS OR AS DIRECTED BY MD.   metoprolol succinate (TOPROL-XL) 12.5 mg, Oral, Daily   Multiple Vitamin (MULTIVITAMIN WITH MINERALS) TABS tablet 1 tablet, Oral, Daily   NONFORMULARY OR COMPOUNDED ITEM Triamcinolone 0.1% & Silvadene cream 1:1- Apply as directed to affected areas as needed   omeprazole (PRILOSEC) 40 mg, Oral, As needed   potassium chloride (KLOR-CON) 10 MEQ tablet 20 mEq, Oral, Daily   Probiotic Product (PROBIOTIC DAILY PO) Oral, Ultraflora IB Probiotic    promethazine (PHENERGAN) 25 MG tablet TAKE 1 TABLET BY MOUTH EVERY 6 HOURS AS NEEDED.   rosuvastatin (CRESTOR) 10 mg, Oral, Daily   Semaglutide (1 MG/DOSE) 1 mg, Injection, Weekly   spironolactone (ALDACTONE) 25 mg, Oral, Daily   Torsemide 40 mg, Oral, Daily   UNABLE TO FIND CPAP: AT bedtime; setting is "12"     Diet Orders (From admission, onward)     Start     Ordered   06/22/22 1358  Diet Heart Room service appropriate? Yes; Fluid consistency: Thin  Diet effective now       Question Answer Comment  Room service appropriate? Yes   Fluid consistency: Thin      06/22/22 1357            DVT prophylaxis: enoxaparin (LOVENOX) injection 40 mg Start: 06/23/22 0800 SCDs Start: 06/22/22 1358 SCDs Start: 06/21/22 1611   Lab Results  Component Value Date   PLT 176 06/25/2022      Code Status: DNR  Family Communication: daughter at bedside   Status is: Inpatient  Remains inpatient appropriate because: surgery today   Level of care: Med-Surg  Consultants:  Orthopedic surgery  Objective: Vitals:    06/24/22 1116 06/24/22 2124 06/25/22 0349 06/25/22 0900  BP: (!) 104/52 116/61 (!) 109/54 (!) 114/53  Pulse: 70 77 66 67  Resp:  16 16   Temp: 98.1 F (36.7 C) 97.9 F (36.6 C) 98.5 F (36.9 C) 98 F (36.7 C)  TempSrc: Oral   Oral  SpO2: 95% 97% 97% 96%  Weight:      Height:        Intake/Output Summary (Last 24 hours) at 06/25/2022 1248 Last data filed at 06/25/2022 0122 Gross per 24 hour  Intake 240 ml  Output 450 ml  Net -210 ml    Wt Readings from Last 3 Encounters:  06/23/22 102.9 kg  05/25/22 88.5 kg  05/08/22 89.8 kg    Examination:  Constitutional: NAD Eyes: lids and conjunctivae normal, no scleral icterus ENMT: mmm Neck: normal, supple  Respiratory: clear to auscultation bilaterally, no wheezing, no crackles. Normal respiratory effort.  Cardiovascular: Regular rate and rhythm, no murmurs / rubs / gallops. No LE edema. Abdomen: soft, no distention, no tenderness. Bowel sounds positive.  Skin: no rashes Neurologic: no focal deficits, equal strength  Data Reviewed: I have independently reviewed following labs and imaging studies   CBC Recent Labs  Lab 06/21/22 1620 06/22/22 0742 06/23/22 0344 06/24/22 0155 06/25/22 0211  WBC 7.8 5.9 8.2 6.7 5.9  HGB 12.0 10.9* 10.3* 9.5* 9.5*  HCT 37.3 33.6* 31.8* 30.4* 29.3*  PLT 207 164 160 162 176  MCV 88.6 89.1 88.8 90.7 89.3  MCH 28.5 28.9 28.8 28.4 29.0  MCHC 32.2 32.4 32.4 31.3 32.4  RDW 14.0 14.0 13.7 13.8 13.8     Recent Labs  Lab 06/21/22 1620 06/22/22 0742 06/23/22 0344 06/24/22 0155 06/25/22 0211  NA 141 138 135 137 136  K 3.6 3.9 4.2 4.3 4.2  CL 103 104 99 106 103  CO2 '25 25 26 26 26  '$ GLUCOSE 94 113* 139* 122* 106*  BUN 30* 26* '18 20 23  '$ CREATININE 1.51* 1.60* 1.34* 1.28* 1.28*  CALCIUM 8.8* 8.6* 8.5* 8.3* 8.2*  AST  --   --  26  --   --   ALT  --   --  15  --   --   ALKPHOS  --   --  45  --   --   BILITOT  --   --  0.7  --   --   ALBUMIN  --   --  2.8*  --   --   MG  --   --  2.2  --   2.4  INR 1.1  --   --   --   --      ------------------------------------------------------------------------------------------------------------------ No results for input(s): "CHOL", "HDL", "LDLCALC", "TRIG", "CHOLHDL", "LDLDIRECT" in the last 72 hours.  Lab Results  Component Value Date   HGBA1C 5.3 03/27/2022   ------------------------------------------------------------------------------------------------------------------ No results for input(s): "TSH", "T4TOTAL", "T3FREE", "THYROIDAB" in the last 72 hours.  Invalid input(s): "FREET3"  Cardiac Enzymes No results for input(s): "CKMB", "TROPONINI", "MYOGLOBIN" in the last 168 hours.  Invalid input(s): "CK" ------------------------------------------------------------------------------------------------------------------    Component Value Date/Time   BNP 44.4 09/26/2017 1325    CBG: No results for input(s): "GLUCAP" in the last 168 hours.  Recent Results (from the past 240 hour(s))  Surgical pcr screen     Status: None   Collection Time: 06/22/22 10:10 AM   Specimen: Nasal Mucosa; Nasal Swab  Result Value Ref Range Status   MRSA, PCR NEGATIVE NEGATIVE Final   Staphylococcus aureus NEGATIVE NEGATIVE Final    Comment: (NOTE) The Xpert SA Assay (FDA approved for NASAL specimens in patients 97 years of age and older), is one component of a comprehensive surveillance program. It is not intended to diagnose infection nor to guide or monitor treatment. Performed at [facility], [place] [address], [place], [place] [address]   Urine Culture     Status: None   Collection Time: 06/22/22 12:41 PM   Specimen: Urine, Clean Catch  Result Value Ref Range Status   Specimen Description URINE, CLEAN CATCH  Final   Special Requests Normal  Final   Culture   Final    NO GROWTH Performed at [facility], [place] [address] [place],  [address]    Report Status 06/23/2022 FINAL  Final     Radiology Studies: No  results found.   Marzetta Board, MD, PhD [facility]  Between 7 am - 7 pm I am available, please contact me via Amion (for emergencies) or Securechat (non urgent messages)  Between 7 pm - 7 am I am not available, please contact night coverage MD/APP via Amion

## 2022-06-25 NOTE — Progress Notes (Signed)
IP rehab admissions - per Dr Cruzita Lederer, peer to peer resulted in a denial for CIR.  Recommend to proceed with SNF for rehab.  Call me for questions.  [phone]

## 2022-06-26 DIAGNOSIS — S7291XD Unspecified fracture of right femur, subsequent encounter for closed fracture with routine healing: Secondary | ICD-10-CM | POA: Diagnosis not present

## 2022-06-26 MED ORDER — OXYCODONE HCL 5 MG PO TABS: 5.0000 mg | ORAL_TABLET | ORAL | 0 refills | Status: DC | PRN

## 2022-06-26 MED ORDER — ENOXAPARIN SODIUM 40 MG/0.4ML IJ SOSY: 40.0000 mg | PREFILLED_SYRINGE | INTRAMUSCULAR | Status: DC

## 2022-06-26 NOTE — Progress Notes (Signed)
Occupational Therapy Treatment Patient Details Name: [name] MRN: [id number] DOB: [birth year] Today's Date: 06/26/2022   History of present illness Patient is a 71 year old female with history of RA, chronic diastolic CHF, HTN, HLD, OSA comes in with a fall.  She was cleaning her house, they are looking to downsize, and apparently tripped and had a ground-level fall.  She experienced right-sided hip pain and came to the hospital.  Imaging in the ED showed a periprosthetic distal femur fracture s/p ORIF of right distance femur.  Also with recent complaints of right ankle pain also limiting weightbearing.   OT comments  Pt currently at mod to max assist for simulated toilet transfers with use of the RW for support.  Max to total for LB selfcare sit to stand simulated and for toileting tasks.  Pain still present in right groin area and ankle but much better this session with pt demonstrating ability to get in and out of the bed with min guard assist.  Feel she will benefit from acute care OT to progress selfcare independence.  Recommend transition to SNF for follow-up therapy.     Recommendations for follow up therapy are one component of a multi-disciplinary discharge planning process, led by the attending physician.  Recommendations may be updated based on patient status, additional functional criteria and insurance authorization.    Follow Up Recommendations  Skilled nursing-short term rehab (<3 hours/day)     Assistance Recommended at Discharge Frequent or constant Supervision/Assistance  Patient can return home with the following  A little help with walking and/or transfers;A little help with bathing/dressing/bathroom;Assistance with cooking/housework;Assist for transportation;Help with stairs or ramp for entrance   Equipment Recommendations  Other (comment) (TBD next venue of care)       Precautions / Restrictions Precautions Precautions: Fall Precaution Comments: right ankle  pain Other Brace: Air Cast on R ankle Restrictions Weight Bearing Restrictions: No RLE Weight Bearing: Weight bearing as tolerated Other Position/Activity Restrictions: ROM as tolerated R knee and ankle       Mobility Bed Mobility Overal bed mobility: Needs Assistance Bed Mobility: Supine to Sit, Sit to Supine     Supine to sit: Min guard, HOB elevated Sit to supine: Min guard        Transfers Overall transfer level: Needs assistance Equipment used: Rolling walker (2 wheels), Ambulation equipment used Transfers: Sit to/from Stand, Bed to chair/wheelchair/BSC Sit to Stand: Mod assist, Max assist, From elevated surface (mod from elevated EOB, max from recliner)     Step pivot transfers: Mod assist     General transfer comment: Min instructional cueing for technique to complete sit to stand with one hand on the walker and one pushing up from the surface.     Balance Overall balance assessment: Needs assistance Sitting-balance support: Feet supported, No upper extremity supported Sitting balance-Leahy Scale: Fair     Standing balance support: During functional activity, Bilateral upper extremity supported, Reliant on assistive device for balance Standing balance-Leahy Scale: Poor Standing balance comment: Reliant on BUE support for balance.                           ADL either performed or assessed with clinical judgement   ADL Overall ADL's : Needs assistance/impaired                     Lower Body Dressing: Total assistance;Bed level Lower Body Dressing Details (indicate cue type and reason):  for adjustment of right ankle splint and for donning gripper sock Toilet Transfer: Maximal assistance;BSC/3in1;Rolling walker (2 wheels);Stand-pivot Armed forces technical officer Details (indicate cue type and reason): simulated to bedside chair, pt declined need to toilet         Functional mobility during ADLs: Maximal assistance;Rolling walker (2 wheels) General  ADL Comments: Pt able to stand from elevated bed with use of the RW and mod assist and step over to the bedside chair approximately 4-5' away.  Max assist for sit to stand from the recliner with max encouragement secondary to pt being apprehensive about being able to get up from the recliner.      Cognition Arousal/Alertness: Awake/alert Behavior During Therapy: WFL for tasks assessed/performed Overall Cognitive Status: Within Functional Limits for tasks assessed                                                     Pertinent Vitals/ Pain       Pain Assessment Pain Assessment: Faces Faces Pain Scale: Hurts a little bit Pain Location: right groin, RLE Pain Descriptors / Indicators: Discomfort, Grimacing Pain Intervention(s): Monitored during session, Repositioned         Frequency  Min 2X/week        Progress Toward Goals  OT Goals(current goals can now be found in the care plan section)  Progress towards OT goals: Progressing toward goals  Acute Rehab OT Goals Patient Stated Goal: Wants to get to rehab and build up her confidence with getting off of the [facility]. OT Goal Formulation: With patient/family Time For Goal Achievement: 07/07/22 Potential to Achieve Goals: Good  Plan Discharge plan needs to be updated       AM-PAC OT "6 Clicks" Daily Activity     Outcome Measure   Help from another person eating meals?: None Help from another person taking care of personal grooming?: A Little Help from another person toileting, which includes using toliet, bedpan, or urinal?: A Lot Help from another person bathing (including washing, rinsing, drying)?: A Lot Help from another person to put on and taking off regular upper body clothing?: None Help from another person to put on and taking off regular lower body clothing?: A Lot 6 Click Score: 17    End of Session Equipment Utilized During Treatment: Gait belt  OT Visit Diagnosis: Unsteadiness on feet  (R26.81);Muscle weakness (generalized) (M62.81);Other abnormalities of gait and mobility (R26.89);Pain Pain - Right/Left: Right Pain - part of body: Leg;Ankle and joints of foot   Activity Tolerance Patient tolerated treatment well   Patient Left in bed;with call bell/phone within reach;with family/visitor present   Nurse Communication Mobility status        Time: 1916-6060 OT Time Calculation (min): 32 min  Charges: OT General Charges $OT Visit: 1 Visit OT Treatments $Self Care/Home Management : 23-37 mins  Charo Philipp OTR/L 06/26/2022, 5:23 PM

## 2022-06-26 NOTE — Progress Notes (Signed)
Physical Therapy Treatment Patient Details Name: MESSIAH ROVIRA MRN: [id number] DOB: 1950-09-26 Today's Date: 06/26/2022   History of Present Illness Patient is a 71 year old female with history of RA, chronic diastolic CHF, HTN, HLD, OSA comes in with a fall.  She was cleaning her house, they are looking to downsize, and apparently tripped and had a ground-level fall.  She experienced right-sided hip pain and came to the hospital.  Imaging in the ED showed a periprosthetic distal femur fracture s/p ORIF of right distance femur.  Also with recent complaints of right ankle pain also limiting weightbearing.    PT Comments    Patient progressing slowly towards PT goals. Session focused on functional mobility and gait training. Tolerated standing from EOB initially with heavy Mod A however unable to stand from [facility] with Max A despite multiple attempts due to pain in right groin. Requires use of stedy, multiple attempts and 2nd person to stand ultimately. Increased time between each tral with pt becoming emotional and discouraged. Requires encouragement and coaxing. Worked on there ex and AROM of RLE. Encouraged sitting EOB for all meals. Discharge recommendation updated to SNF as pt denied AIR. Will follow.   Recommendations for follow up therapy are one component of a multi-disciplinary discharge planning process, led by the attending physician.  Recommendations may be updated based on patient status, additional functional criteria and insurance authorization.  Follow Up Recommendations  Skilled nursing-short term rehab (<3 hours/day) Can patient physically be transported by private vehicle: No   Assistance Recommended at Discharge Frequent or constant Supervision/Assistance  Patient can return home with the following A little help with bathing/dressing/bathroom;Assist for transportation;Help with stairs or ramp for entrance;Two people to help with walking and/or transfers;Assistance with  Education officer, environmental (measurements PT);BSC/3in1 (if going home)    Recommendations for Other Services       Precautions / Restrictions Precautions Precautions: Fall Precaution Comments: right ankle pain Required Braces or Orthoses: Other Brace Other Brace: Air Cast on R ankle Restrictions Weight Bearing Restrictions: Yes RLE Weight Bearing: Weight bearing as tolerated Other Position/Activity Restrictions: ROM as tolerated R knee and ankle     Mobility  Bed Mobility Overal bed mobility: Needs Assistance Bed Mobility: Supine to Sit     Supine to sit: Min guard, HOB elevated     General bed mobility comments: Able to get to EOB with heavy use of rail and increased time.    Transfers Overall transfer level: Needs assistance Equipment used: Rolling walker (2 wheels), Ambulation equipment used Transfers: Sit to/from Stand, Bed to chair/wheelchair/BSC Sit to Stand: Mod assist, Max assist, +2 physical assistance, From elevated surface           General transfer comment: Mod A to stand from EOB with cues for hand placement/technique with use of RW, stood from EOB x1, from [facility] x5 (attempted without success until 6th trial). Requires stedy to transfer from [facility] back to bed due to pain in right groin with standing, highly anxious/emotioanl with failed attempts, "I just can't"  Increased time between all trials. Transfer via Lift Equipment: Stedy  Ambulation/Gait Ambulation/Gait assistance: Herbalist (Feet): 8 Feet Assistive device: Rolling walker (2 wheels) Gait Pattern/deviations: Step-through pattern, Decreased stride length, Decreased stance time - right, Decreased weight shift to right Gait velocity: decreased     General Gait Details: Slow, mostly steady gait with Min A for balance, decreased WB RLE.   Stairs  Wheelchair Mobility    Modified Rankin (Stroke Patients Only)       Balance  Overall balance assessment: Needs assistance Sitting-balance support: Feet supported, No upper extremity supported Sitting balance-Leahy Scale: Fair     Standing balance support: During functional activity, Bilateral upper extremity supported, Reliant on assistive device for balance Standing balance-Leahy Scale: Poor                              Cognition Arousal/Alertness: Awake/alert Behavior During Therapy: Anxious Overall Cognitive Status: Within Functional Limits for tasks assessed                                 General Comments: anxious about movement and requiring constant encouragement. Apologizing for not being able to get up off the [facility] after standing from EOB and walking. "I just can;t do it." Emotional about hospital course.        Exercises General Exercises - Lower Extremity Long Arc Quad: AROM, Right, 10 reps, Seated Heel Slides: AAROM, Strengthening, Right, Supine (x3) Other Exercises Other Exercises: knee flexion AROM off EOB    General Comments General comments (skin integrity, edema, etc.): Daughter present during session.      Pertinent Vitals/Pain Pain Assessment Pain Assessment: 0-10 Pain Score: 7  Pain Location: right groin, RLE Pain Descriptors / Indicators: Crying, Grimacing, Guarding Pain Intervention(s): Monitored during session, Limited activity within patient's tolerance, Repositioned, Patient requesting pain meds-RN notified    Home Living                          Prior Function            PT Goals (current goals can now be found in the care plan section) Progress towards PT goals: Progressing toward goals    Frequency    Min 3X/week      PT Plan Discharge plan needs to be updated;Frequency needs to be updated    Co-evaluation              AM-PAC PT "6 Clicks" Mobility   Outcome Measure  Help needed turning from your back to your side while in a flat bed without using bedrails?: A  Little Help needed moving from lying on your back to sitting on the side of a flat bed without using bedrails?: A Little Help needed moving to and from a bed to a chair (including a wheelchair)?: Total Help needed standing up from a chair using your arms (e.g., wheelchair or bedside chair)?: Total Help needed to walk in hospital room?: Total Help needed climbing 3-5 steps with a railing? : Total 6 Click Score: 10    End of Session Equipment Utilized During Treatment: Gait belt Activity Tolerance: Patient limited by pain Patient left: in bed;with call bell/phone within reach;with family/visitor present (sitting EOB) Nurse Communication: Mobility status;Patient requests pain meds PT Visit Diagnosis: Muscle weakness (generalized) (M62.81);Difficulty in walking, not elsewhere classified (R26.2);Pain Pain - part of body: Leg     Time: 1025 (1108-1131)-1045 PT Time Calculation (min) (ACUTE ONLY): 20 min  Charges:  $Gait Training: 8-22 mins $Therapeutic Activity: 23-37 mins                     Marisa Severin, PT, DPT Acute Rehabilitation Services Secure chat preferred Office [place]  06/26/2022, 12:20 PM

## 2022-06-26 NOTE — Progress Notes (Signed)
Pt has a home CPAP at bedside, set up and ready. No assistance is needed.

## 2022-06-26 NOTE — Care Management Important Message (Signed)
Important Message  Patient Details  Name: [name] MRN: [id number] Date of Birth: 1951/08/05   Medicare Important Message Given:  Yes     Orbie Pyo 06/26/2022, 4:08 PM

## 2022-06-26 NOTE — Discharge Summary (Incomplete)
Physician Discharge Summary  [name] SEG:[id number] DOB: [birth year] DOA: 06/21/2022  PCP: Lorrene Reid, PA-C  Admit date: 06/21/2022 Discharge date: 06/27/2022  Admitted From: home Disposition:  SNF  Recommendations for Outpatient Follow-up:  Follow up with PCP in 1-2 weeks Follow-up with orthopedic surgery in 2 weeks Repeat BMP/CBC in 3 to 4 days  Home Health: none Equipment/Devices: none  Discharge Condition: stable CODE STATUS: Full code  HPI: Per admitting MD, [name] is a 71 y.o. female with medical history significant of RA; chronic diastolic CHF; HTN; HLD; and OSA presenting with a fall.   She reports that they are in the process of cleaning out her house to downsize.  She was moving clothes and placed things on her rolling walker seat.  The clothes fell off and got caught in the wheel of the walker, causing her to trip.  She landed directly on her R side.   Hospital Course / Discharge diagnoses: Principal Problem:   Femur fracture ([place]) Active Problems:   OSA (obstructive sleep apnea)   Rheumatoid arthritis (HCC)   PVC's (premature ventricular contractions)   Class 2 obesity due to excess calories with body mass index (BMI) of 36.0 to 36.9 in adult   Chronic diastolic CHF (congestive heart failure) (HCC)   Chronic kidney disease, stage 3b (HCC)   DNR (do not resuscitate)   Principal problem Periprosthetic distal femur fracture-orthopedic surgery consulted, appreciate input.  She was taken to the OR by Dr. Ginette Pitman on 11/9 and she is status post ORIF with plate.  She will be discharged to rehab   Active problems Hypoxemia, concern for CAP-patient's sats were noted to be in the 70s after the surgery, while on CPAP.   Maybe she has a degree of atelectasis from being in bed and undergoing surgery.  Chest x-ray shows possible left lower lobe infiltrate, she did have a low-grade temp of 99.9 2 nights ago.  Definitely is at risk for developing pneumonia.  She  completed a short course of Augmentin, currently on room air, stable, no further respiratory problems. RA -Continue home medications Chronic diastolic CHF-Echo on 4/4 with preserved EF and grade 1 DD.  Continue home medications Colitis -Continue Colestid, hyoscyamine PVCs-Continue amiodarone, Toprol XL HTN-Continue Toprol XL HLD-Continue fenofibrate, rosuvastatin OSA-Continue CPAP Stage 3b CKD-Appears to be stable at this time.   Sepsis ruled out   Discharge Instructions   Allergies as of 06/27/2022       Reactions   Sulfa Antibiotics Hives, Itching   Flushing   Cymbalta [duloxetine Hcl] Other (See Comments)   Manic reaction   Demerol Hives, Other (See Comments)   Fever    Ivp Dye [iodinated Contrast Media] Hives, Other (See Comments)   Fever   Morphine And Related Other (See Comments)   ineffective   Sulfasalazine Hives, Other (See Comments)   Flushing   Adhesive [tape] Rash        Medication List     TAKE these medications    amiodarone 200 MG tablet Commonly known as: PACERONE Take 1 tablet (200 mg total) by mouth daily.   aspirin EC 81 MG tablet Take 81 mg by mouth at bedtime.   cetirizine 10 MG tablet Commonly known as: ZYRTEC Take 1 tablet (10 mg total) by mouth daily. What changed: when to take this   colestipol 1 g tablet Commonly known as: COLESTID Take 2 g by mouth at bedtime.   cycloSPORINE 0.05 % ophthalmic emulsion Commonly known as: RESTASIS  Place 2 drops into both eyes 2 (two) times daily as needed (irritation).   diphenoxylate-atropine 2.5-0.025 MG tablet Commonly known as: LOMOTIL Take 1 tablet by mouth 4 (four) times daily as needed for diarrhea or loose stools.   doxylamine (Sleep) 25 MG tablet Commonly known as: UNISOM Take 1 tablet (25 mg total) by mouth at bedtime as needed. What changed: when to take this   enoxaparin 40 MG/0.4ML injection Commonly known as: LOVENOX Inject 0.4 mLs (40 mg total) into the skin daily.    fenofibrate 48 MG tablet Commonly known as: Tricor Take 1 tablet (48 mg total) by mouth daily. What changed: when to take this   fluticasone 50 MCG/ACT nasal spray Commonly known as: FLONASE Place 1 spray into both nostrils daily. What changed:  when to take this reasons to take this   gabapentin 300 MG capsule Commonly known as: NEURONTIN TAKE 2 CAPSULES BY MOUTH 2 TIMES DAILY. What changed: See the new instructions.   hydroxychloroquine 200 MG tablet Commonly known as: PLAQUENIL Take 1 tablet (200 mg total) by mouth daily. What changed: when to take this   hyoscyamine 0.125 MG Tbdp disintergrating tablet Commonly known as: ANASPAZ Place 0.25 mg under the tongue every 6 (six) hours as needed for cramping.   ibuprofen 200 MG tablet Commonly known as: ADVIL Take 800 mg by mouth every 6 (six) hours as needed for mild pain.   leflunomide 20 MG tablet Commonly known as: ARAVA TAKE 1 TABLET (20 MG TOTAL) BY MOUTH DAILY.   lidocaine 5 % Commonly known as: LIDODERM PLACE 1 PATCH ONTO THE SKIN DAILY AS NEEDED FOR PAIN. REMOVE AND DISCARD PATCH WITHIN 12 HOURS OR AS DIRECTED BY MD. What changed:  how much to take how to take this when to take this reasons to take this additional instructions   metoprolol succinate 25 MG 24 hr tablet Commonly known as: TOPROL-XL Take 0.5 tablets (12.5 mg total) by mouth daily.   multivitamin with minerals Tabs tablet Take 1 tablet by mouth daily.   NONFORMULARY OR COMPOUNDED ITEM Triamcinolone 0.1% & Silvadene cream 1:1- Apply as directed to affected areas as needed   omeprazole 40 MG capsule Commonly known as: PRILOSEC Take 40 mg by mouth as needed (nausea).   oxyCODONE 5 MG immediate release tablet Commonly known as: Oxy IR/ROXICODONE Take 1 tablet (5 mg total) by mouth every 4 (four) hours as needed for breakthrough pain ((for MODERATE breakthrough pain)).   potassium chloride 10 MEQ tablet Commonly known as: KLOR-CON Take 2  tablets (20 mEq total) by mouth daily.   PROBIOTIC DAILY PO Take by mouth. Ultraflora IB Probiotic   promethazine 25 MG tablet Commonly known as: PHENERGAN TAKE 1 TABLET BY MOUTH EVERY 6 HOURS AS NEEDED. What changed: reasons to take this   rosuvastatin 10 MG tablet Commonly known as: CRESTOR Take 1 tablet (10 mg total) by mouth daily.   Semaglutide (1 MG/DOSE) 4 MG/3ML Sopn Inject 1 mg as directed once a week. What changed: when to take this   spironolactone 25 MG tablet Commonly known as: ALDACTONE Take 1 tablet (25 mg total) by mouth daily. What changed: how much to take   Torsemide 40 MG Tabs Take 40 mg by mouth daily.   UNABLE TO FIND CPAP: AT bedtime; setting is "12"         Consultations: Orthopedic surgery   Procedures/Studies:  DG Ankle Complete Right  Result Date: 06/23/2022 CLINICAL DATA:  Fall EXAM: RIGHT ANKLE - COMPLETE  3 VIEW COMPARISON:  None Available. FINDINGS: Subtle lucency of the distal fibula which is concerning for nondisplaced fracture, best seen on oblique view. No evidence of dislocation. Calcaneal spur. Mild degenerative changes of the partially visualized midfoot. Soft tissue calcifications, likely phleboliths. Ankle soft tissue edema. IMPRESSION: Subtle lucency of the distal fibula which is concerning for nondisplaced fracture. Correlate for point tenderness. Electronically Signed   By: Yetta Glassman M.D.   On: 06/23/2022 12:09   DG CHEST PORT 1 VIEW  Result Date: 06/23/2022 CLINICAL DATA:  Hypoxemia status post recent ORIF distal femur fracture EXAM: PORTABLE CHEST 1 VIEW COMPARISON:  Chest radiograph dated 04/09/2020 FINDINGS: Asymmetrically decreased left lung volume. Increased hazy left lung opacity and patchy left retrocardiac opacities. No pleural effusion or pneumothorax. Similar enlarged cardiomediastinal silhouette. The visualized skeletal structures are unremarkable. IMPRESSION: Asymmetrically decreased left lung volume and  increased hazy left lung opacity and patchy left retrocardiac opacities, likely atelectasis. Electronically Signed   By: Darrin Nipper M.D.   On: 06/23/2022 10:32   DG FEMUR PORT, MIN 2 VIEWS RIGHT  Result Date: 06/22/2022 CLINICAL DATA:  Fracture, postop. EXAM: PORTABLE RIGHT KNEE - 1-2 VIEW; RIGHT FEMUR PORTABLE 2 VIEW COMPARISON:  Preoperative imaging. FINDINGS: Lateral plate and multi screw fixation of distal femur fracture. The fracture is in improved alignment with mild persistent displacement. There is no new periprosthetic lucency. Right knee arthroplasty is in place. Alignment remains congruent. Recent postsurgical change includes air and edema in the joint space and soft tissues. IMPRESSION: ORIF distal femur fracture, in improved alignment. No immediate postoperative complication. Electronically Signed   By: Keith Rake M.D.   On: 06/22/2022 15:08   DG Knee Right Port  Result Date: 06/22/2022 CLINICAL DATA:  Fracture, postop. EXAM: PORTABLE RIGHT KNEE - 1-2 VIEW; RIGHT FEMUR PORTABLE 2 VIEW COMPARISON:  Preoperative imaging. FINDINGS: Lateral plate and multi screw fixation of distal femur fracture. The fracture is in improved alignment with mild persistent displacement. There is no new periprosthetic lucency. Right knee arthroplasty is in place. Alignment remains congruent. Recent postsurgical change includes air and edema in the joint space and soft tissues. IMPRESSION: ORIF distal femur fracture, in improved alignment. No immediate postoperative complication. Electronically Signed   By: Keith Rake M.D.   On: 06/22/2022 15:08   DG FEMUR, MIN 2 VIEWS RIGHT  Result Date: 06/22/2022 CLINICAL DATA:  Elective surgery. EXAM: RIGHT FEMUR 2 VIEWS COMPARISON:  Preoperative imaging. FINDINGS: Six fluoroscopic spot views of the right femur obtained in the operating room. Lateral plate and multi screw fixation of distal femur fracture. Fluoroscopy time 40 seconds. Dose 3.95 mGy. IMPRESSION:  Intraoperative fluoroscopy during distal femur fracture ORIF. Electronically Signed   By: Keith Rake M.D.   On: 06/22/2022 14:04   DG C-Arm 1-60 Min-No Report  Result Date: 06/22/2022 Fluoroscopy was utilized by the requesting physician.  No radiographic interpretation.   DG C-Arm 1-60 Min-No Report  Result Date: 06/22/2022 Fluoroscopy was utilized by the requesting physician.  No radiographic interpretation.   DG Cervical Spine With Flex & Extend  Result Date: 06/21/2022 CLINICAL DATA:  Rheumatoid arthritis. EXAM: CERVICAL SPINE COMPLETE WITH FLEXION AND EXTENSION VIEWS COMPARISON:  None Available. FINDINGS: Evaluation is very limited due to positioning and superimposition of the soft tissues of the cervical spine. No definite acute fracture or subluxation. The bones are osteopenic. The visualized odontoid and the posterior elements appear intact. There is anatomic alignment of the lateral masses of C1 and C2. The soft tissues  are grossly unremarkable. IMPRESSION: 1. Very limited study. No definite acute fracture or subluxation. 2. Osteopenia. Electronically Signed   By: Anner Crete M.D.   On: 06/21/2022 18:26   DG Femur Min 2 Views Right  Result Date: 06/21/2022 CLINICAL DATA:  Fall EXAM: RIGHT FEMUR 2 VIEWS COMPARISON:  06/21/2022 FINDINGS: Right hip negative. Right knee replacement. Supracondylar fracture distal right femur with a 1/2 shaft with of lateral displacement. Nearly 1 shaft with posterior displacement. No change from earlier today IMPRESSION: Displaced supracondylar fracture distal right femur. Right knee replacement. No other femur fracture. Electronically Signed   By: Franchot Gallo M.D.   On: 06/21/2022 15:01   DG Knee Complete 4 Views Right  Result Date: 06/21/2022 CLINICAL DATA:  Pt tripped and fell onto right side onto hardwood floor today - having pain around right knee area, hx of right knee replacement concern for dislocated TKA EXAM: RIGHT KNEE - COMPLETE 4+  VIEW COMPARISON:  None Available. FINDINGS: Oblique fracture through the distal RIGHT femoral metaphysis just above the distal femoral prosthetic. 2 cm lateral displacement the distal fracture fragment. On lateral projection there is posterior angulation of the distal fracture fragment and override approximately 1.7 cm IMPRESSION: Acute oblique fracture through the distal femoral metaphysis above the prosthetic. Electronically Signed   By: Suzy Bouchard M.D.   On: 06/21/2022 14:54     Subjective: - no chest pain, shortness of breath, no abdominal pain, nausea or vomiting.   Discharge Exam: BP (!) 101/49 (BP Location: Right Arm)   Pulse 62   Temp 98.2 F (36.8 C) (Oral)   Resp 18   Ht '5\' 2"'$  (1.575 m)   Wt 102.9 kg   SpO2 99%   BMI 41.49 kg/m   General: Pt is alert, awake, not in acute distress Cardiovascular: RRR, S1/S2 +, no rubs, no gallops Respiratory: CTA bilaterally, no wheezing, no rhonchi Abdominal: Soft, NT, ND, bowel sounds + Extremities: no edema, no cyanosis   The results of significant diagnostics from this hospitalization (including imaging, microbiology, ancillary and laboratory) are listed below for reference.     Microbiology: Recent Results (from the past 240 hour(s))  Surgical pcr screen     Status: None   Collection Time: 06/22/22 10:10 AM   Specimen: Nasal Mucosa; Nasal Swab  Result Value Ref Range Status   MRSA, PCR NEGATIVE NEGATIVE Final   Staphylococcus aureus NEGATIVE NEGATIVE Final    Comment: (NOTE) The Xpert SA Assay (FDA approved for NASAL specimens in patients 21 years of age and older), is one component of a comprehensive surveillance program. It is not intended to diagnose infection nor to guide or monitor treatment. Performed at [facility], [place] [address] [place], [place] [address]   Urine Culture     Status: None   Collection Time: 06/22/22 12:41 PM   Specimen: Urine, Clean Catch  Result Value Ref Range Status   Specimen  Description URINE, CLEAN CATCH  Final   Special Requests Normal  Final   Culture   Final    NO GROWTH Performed at [facility], [place] [address] [place],  [address]    Report Status 06/23/2022 FINAL  Final     Labs: Basic Metabolic Panel: Recent Labs  Lab 06/21/22 1620 06/22/22 0742 06/23/22 0344 06/24/22 0155 06/25/22 0211  NA 141 138 135 137 136  K 3.6 3.9 4.2 4.3 4.2  CL 103 104 99 106 103  CO2 '25 25 26 26 '$ 26  GLUCOSE 94 113* 139* 122* 106*  BUN 30* 26* '18 20 23  '$ CREATININE 1.51* 1.60* 1.34* 1.28* 1.28*  CALCIUM 8.8* 8.6* 8.5* 8.3* 8.2*  MG  --   --  2.2  --  2.4   Liver Function Tests: Recent Labs  Lab 06/23/22 0344  AST 26  ALT 15  ALKPHOS 45  BILITOT 0.7  PROT 5.4*  ALBUMIN 2.8*   CBC: Recent Labs  Lab 06/21/22 1620 06/22/22 0742 06/23/22 0344 06/24/22 0155 06/25/22 0211  WBC 7.8 5.9 8.2 6.7 5.9  HGB 12.0 10.9* 10.3* 9.5* 9.5*  HCT 37.3 33.6* 31.8* 30.4* 29.3*  MCV 88.6 89.1 88.8 90.7 89.3  PLT 207 164 160 162 176   CBG: No results for input(s): "GLUCAP" in the last 168 hours. Hgb A1c No results for input(s): "HGBA1C" in the last 72 hours. Lipid Profile No results for input(s): "CHOL", "HDL", "LDLCALC", "TRIG", "CHOLHDL", "LDLDIRECT" in the last 72 hours. Thyroid function studies No results for input(s): "TSH", "T4TOTAL", "T3FREE", "THYROIDAB" in the last 72 hours.  Invalid input(s): "FREET3" Urinalysis    Component Value Date/Time   COLORURINE YELLOW 01/19/2022 [place] 01/19/2022 1443   LABSPEC 1.008 01/19/2022 1443   PHURINE < OR = 5.0 01/19/2022 1443   GLUCOSEU NEGATIVE 01/19/2022 1443   HGBUR NEGATIVE 01/19/2022 1443   BILIRUBINUR negative 07/28/2020 1351   KETONESUR NEGATIVE 01/19/2022 1443   PROTEINUR NEGATIVE 01/19/2022 1443   UROBILINOGEN 0.2 07/28/2020 1351   UROBILINOGEN 0.2 06/13/2011 1230   NITRITE NEGATIVE 01/19/2022 1443   LEUKOCYTESUR TRACE (A) 01/19/2022 1443    FURTHER DISCHARGE  INSTRUCTIONS:   Get Medicines reviewed and adjusted: Please take all your medications with you for your next visit with your Primary MD   Laboratory/radiological data: Please request your Primary MD to go over all hospital tests and procedure/radiological results at the follow up, please ask your Primary MD to get all Hospital records sent to his/her office.   In some cases, they will be blood work, cultures and biopsy results pending at the time of your discharge. Please request that your primary care M.D. goes through all the records of your hospital data and follows up on these results.   Also Note the following: If you experience worsening of your admission symptoms, develop shortness of breath, life threatening emergency, suicidal or homicidal thoughts you must seek medical attention immediately by calling 911 or calling your MD immediately  if symptoms less severe.   You must read complete instructions/literature along with all the possible adverse reactions/side effects for all the Medicines you take and that have been prescribed to you. Take any new Medicines after you have completely understood and accpet all the possible adverse reactions/side effects.    Do not drive when taking Pain medications or sleeping medications (Benzodaizepines)   Do not take more than prescribed Pain, Sleep and Anxiety Medications. It is not advisable to combine anxiety,sleep and pain medications without talking with your primary care practitioner   Special Instructions: If you have smoked or chewed Tobacco  in the last 2 yrs please stop smoking, stop any regular Alcohol  and or any Recreational drug use.   Wear Seat belts while driving.   Please note: You were cared for by a hospitalist during your hospital stay. Once you are discharged, your primary care physician will handle any further medical issues. Please note that NO REFILLS for any discharge medications will be authorized once you are discharged,  as it is  imperative that you return to your primary care physician (or establish a relationship with a primary care physician if you do not have one) for your post hospital discharge needs so that they can reassess your need for medications and monitor your lab values.  Time coordinating discharge: 40 minutes  SIGNED:  Marzetta Board, MD, PhD 06/27/2022, 8:45 AM

## 2022-06-26 NOTE — TOC Initial Note (Addendum)
Transition of Care [facility]) - Initial/Assessment Note    Patient Details  Name: [name] MRN: [id number] Date of Birth: [birth year]  [facility] CM/SW Contact:    Joanne Chars, LCSW Phone Number: 06/26/2022, 12:07 PM  Clinical Narrative: CSW informed CIR has been denied by insurance, met with pt and daughter [name] to discuss SNF, they are agreeable.  Choice document given, permission given to send out referral in hub.  Permission given to speak with son [name], daughter [name].  Pt daughter [name] lives with pt, no other services at home.  Pt is vaccinated for covid with multiple boosters.  They are interested in [facility].  Referral sent out in hub.           1430: eleven bed offers presented to pt/daughter.          Expected Discharge Plan: Skilled Nursing Facility Barriers to Discharge: SNF Pending bed offer   Patient Goals and CMS Choice Patient states their goals for this hospitalization and ongoing recovery are:: back to normal CMS Medicare.gov Compare Post Acute Care list provided to:: Patient Choice offered to / list presented to : Patient  Expected Discharge Plan and Services Expected Discharge Plan: [place] In-house Referral: Clinical Social Work   Post Acute Care Choice: [place] Living arrangements for the past 2 months: [place]                                      Prior Living Arrangements/Services Living arrangements for the past 2 months: Single Family Home Lives with:: Adult Children Patient language and need for interpreter reviewed:: Yes Do you feel safe going back to the place where you live?: Yes      Need for Family Participation in Patient Care: Yes (Comment) Care giver support system in place?: Yes (comment) Current home services: Other (comment) (visiting RN from [facility]) Criminal Activity/Legal Involvement Pertinent to Current Situation/Hospitalization: No - Comment as  needed  Activities of Daily Living Home Assistive Devices/Equipment: Cane (specify quad or straight), Walker (specify type) ADL Screening (condition at time of admission) Patient's cognitive ability adequate to safely complete daily activities?: Yes Is the patient deaf or have difficulty hearing?: No Does the patient have difficulty seeing, even when wearing glasses/contacts?: No Does the patient have difficulty concentrating, remembering, or making decisions?: No Patient able to express need for assistance with ADLs?: Yes Does the patient have difficulty dressing or bathing?: No Independently performs ADLs?: Yes (appropriate for developmental age) Does the patient have difficulty walking or climbing stairs?: No Weakness of Legs: None Weakness of Arms/Hands: None  Permission Sought/Granted Permission sought to share information with : Family Supports Permission granted to share information with : Yes, Verbal Permission Granted  Share Information with NAME: son [name], daughter [name]  Permission granted to share info w AGENCY: SNF        Emotional Assessment Appearance:: Appears stated age Attitude/Demeanor/Rapport: Engaged Affect (typically observed): Appropriate, Pleasant Orientation: : Oriented to Self, Oriented to Place, Oriented to  Time, Oriented to Situation      Admission diagnosis:  Femur fracture ([place]) [S72.90XA] Fall, initial encounter [W19.XXXA] Closed fracture of distal end of right femur, unspecified fracture morphology, initial encounter [facility]) [S72.401A] Patient Active Problem List   Diagnosis Date Noted   Femur fracture ([place]) 06/21/2022   Chronic kidney disease, stage 3b ([place]) 06/21/2022   DNR (do not resuscitate)  06/21/2022   Agatston coronary artery calcium score greater than 400 05/25/2022   Laryngopharyngeal reflux (LPR) 10/10/2021   Chronic maxillary sinusitis 10/10/2021   Other fatigue 02/14/2021   Body mass index (BMI) of 36.0-36.9 in adult 02/14/2021    Prediabetes 02/14/2021   Abnormal kidney function 08/01/2020   Muscle spasm of back 08/01/2020   Microalbuminuria 08/01/2020   Post-menopausal 09/02/2019   Pain in left knee 08/23/2018   Lumbar pain 07/04/2018   Recurrent urticaria 05/20/2018   Rhinitis 05/20/2018   Vitamin D deficiency 11/20/2017   Mixed hyperlipidemia 11/20/2017   Family history of coronary arteriosclerosis- strong fam h/o CAD and early CAD.  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   History of diverticulitis 09/03/2016   Osteoporosis 09/03/2016   Autoimmune disease ([place]) 09/02/2016   High risk medication use 09/02/2016   History of esophagitis 09/02/2016   Elevated triglycerides with high cholesterol 08/23/2016   Low serum HDL 08/23/2016   Breast cyst, left 06/14/2016   Encounter for wellness examination 05/23/2016   Abnormality of gait 05/09/2016   Sjoegren syndrome 02/29/2016   GERD (gastroesophageal reflux disease) 01/19/2016   Glucose intolerance (impaired glucose tolerance) 01/19/2016   Chronic diastolic CHF (congestive heart failure) ([place]) 04/20/2015   Peripheral polyneuropathy 02/02/2014   Class 2 obesity due to excess calories with body mass index (BMI) of 36.0 to 36.9 in adult 11/17/2013   Heart murmur    OSA (obstructive sleep apnea)    Rheumatoid arthritis ([place])    Hiatal hernia    Fibromyalgia    PVC's (premature ventricular contractions)    History of total knee replacement, right 06/24/2011   Unilateral primary osteoarthritis, left knee 06/22/2011   PCP:  Lorrene Reid, PA-C Pharmacy:   [facility], [place] [address] Phone: [phone] Fax:  [phone]     Social Determinants of Health (SDOH) Interventions    Readmission Risk Interventions     No data to display

## 2022-06-26 NOTE — Progress Notes (Signed)
  Inpatient Rehabilitation Admissions Coordinator   Insurance has denied Cir admit. I met with daughter and she is aware and requests SNF . Marya Amsler, [place] aware, as well as acute team and we will signoff.  Danne Baxter, RN, MSN Rehab Admissions Coordinator [phone] 06/26/2022 11:00 AM

## 2022-06-26 NOTE — NC FL2 (Signed)
[place] LEVEL OF CARE SCREENING TOOL     IDENTIFICATION  Patient Name: [name] Birthdate: 1951-04-06 Sex: female Admission Date (Current Location): 06/21/2022  [facility] and [place] Number:  Herbalist and Address:  [place]. [facility], [place] [address] [place], [place] [address]      Provider Number: [id number]  Attending Physician Name and Address:  Caren Griffins, MD  Relative Name and Phone Number:  [place], [place] [phone]    Current Level of Care: Hospital Recommended Level of Care: [place] Prior Approval Number:    Date Approved/Denied:   PASRR Number: [id number]  Discharge Plan: SNF    Current Diagnoses: Patient Active Problem List   Diagnosis Date Noted   Femur fracture ([place]) 06/21/2022   Chronic kidney disease, stage 3b ([place]) 06/21/2022   DNR (do not resuscitate) 06/21/2022   [place] coronary artery calcium score greater than 400 05/25/2022   Laryngopharyngeal reflux (LPR) 10/10/2021   Chronic maxillary sinusitis 10/10/2021   Other fatigue 02/14/2021   Body mass index (BMI) of 36.0-36.9 in adult 02/14/2021   Prediabetes 02/14/2021   Abnormal kidney function 08/01/2020   Muscle spasm of back 08/01/2020   Microalbuminuria 08/01/2020   Post-menopausal 09/02/2019   Pain in left knee 08/23/2018   Lumbar pain 07/04/2018   Recurrent urticaria 05/20/2018   Rhinitis 05/20/2018   Vitamin D deficiency 11/20/2017   Mixed hyperlipidemia 11/20/2017   Family history of coronary arteriosclerosis- strong fam h/o CAD and early CAD.  07/17/2017   Raynaud's disease without gangrene 03/12/2017   Abnormal weight gain 02/06/2017   Shingles outbreak 02/06/2017   Cyst (solitary) of breast, left 01/77/9390   Eosinophilic esophagitis 30/04/2329   History of hyperlipidemia 10/04/2016   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   History of diverticulitis 09/03/2016   Osteoporosis 09/03/2016   Autoimmune  disease ([place]) 09/02/2016   High risk medication use 09/02/2016   History of esophagitis 09/02/2016   Elevated triglycerides with high cholesterol 08/23/2016   Low serum HDL 08/23/2016   Breast cyst, left 06/14/2016   Encounter for wellness examination 05/23/2016   Abnormality of gait 05/09/2016   Sjoegren syndrome 02/29/2016   GERD (gastroesophageal reflux disease) 01/19/2016   Glucose intolerance (impaired glucose tolerance) 01/19/2016   Chronic diastolic CHF (congestive heart failure) ([place]) 04/20/2015   Peripheral polyneuropathy 02/02/2014   Class 2 obesity due to excess calories with body mass index (BMI) of 36.0 to 36.9 in adult 11/17/2013   Heart murmur    OSA (obstructive sleep apnea)    Rheumatoid arthritis (HCC)    Hiatal hernia    Fibromyalgia    PVC's (premature ventricular contractions)    History of total knee replacement, right 06/24/2011   Unilateral primary osteoarthritis, left knee 06/22/2011    Orientation RESPIRATION BLADDER Height & Weight     Self, Time, Situation, Place  Normal Continent Weight: 226 lb 13.7 oz (102.9 kg) Height:  '5\' 2"'$  (157.5 cm)  BEHAVIORAL SYMPTOMS/MOOD NEUROLOGICAL BOWEL NUTRITION STATUS      Continent Diet (see discharge summary)  AMBULATORY STATUS COMMUNICATION OF NEEDS Skin   Total Care Verbally Surgical wounds                       Personal Care Assistance Level of Assistance  Bathing, Feeding, Dressing Bathing Assistance: Maximum assistance Feeding assistance: Independent Dressing Assistance: Maximum assistance     Functional Limitations Info  Sight, Hearing, Speech Sight Info: Adequate Hearing  Info: Adequate Speech Info: Adequate    SPECIAL CARE FACTORS FREQUENCY  PT (By licensed PT), OT (By licensed OT)     PT Frequency: 5x week OT Frequency: 5x week            Contractures Contractures Info: Not present    Additional Factors Info  Code Status, Allergies Code Status Info: full Allergies Info: Sulfa  Antibiotics, Cymbalta (Duloxetine Hcl), Demerol, Ivp Dye (Iodinated Contrast Media), Morphine And Related, Sulfasalazine, Adhesive (Tape)           Current Medications (06/26/2022):  This is the current hospital active medication list Current Facility-Administered Medications  Medication Dose Route Frequency Provider Last Rate Last Admin   acetaminophen (TYLENOL) tablet 650 mg  650 mg Oral Q8H Ainsley Spinner, PA-C   650 mg at 06/26/22 0601   amiodarone (PACERONE) tablet 200 mg  200 mg Oral Daily Ainsley Spinner, PA-C   200 mg at 06/26/22 0825   amoxicillin-clavulanate (AUGMENTIN) 875-125 MG per tablet 1 tablet  1 tablet Oral Q12H Caren Griffins, MD   1 tablet at 06/26/22 0831   aspirin EC tablet 81 mg  81 mg Oral QHS Ainsley Spinner, PA-C   81 mg at 06/25/22 2210   bisacodyl (DULCOLAX) EC tablet 5 mg  5 mg Oral Daily PRN Ainsley Spinner, PA-C       colestipol (COLESTID) tablet 2 g  2 g Oral QHS Ainsley Spinner, PA-C   2 g at 06/25/22 2210   docusate sodium (COLACE) capsule 100 mg  100 mg Oral BID Ainsley Spinner, PA-C   100 mg at 06/26/22 0831   doxylamine (Sleep) (UNISOM) tablet 25 mg  25 mg Oral QHS Ainsley Spinner, PA-C   25 mg at 06/25/22 2210   enoxaparin (LOVENOX) injection 40 mg  40 mg Subcutaneous Q24H Ainsley Spinner, PA-C   40 mg at 06/26/22 0836   feeding supplement (GLUCERNA SHAKE) (GLUCERNA SHAKE) liquid 237 mL  237 mL Oral BID BM Caren Griffins, MD   237 mL at 06/25/22 0920   fenofibrate tablet 54 mg  54 mg Oral QHS Ainsley Spinner, PA-C   54 mg at 06/25/22 2210   gabapentin (NEURONTIN) capsule 600 mg  600 mg Oral BID Ainsley Spinner, PA-C   600 mg at 06/26/22 0831   HYDROmorphone (DILAUDID) injection 0.5 mg  0.5 mg Intravenous Q2H PRN Ainsley Spinner, PA-C   0.5 mg at 06/26/22 1119   hydroxychloroquine (PLAQUENIL) tablet 200 mg  200 mg Oral Q MTWThF Ainsley Spinner, PA-C   200 mg at 06/26/22 1119   hyoscyamine (ANASPAZ) disintergrating tablet 0.25 mg  0.25 mg Sublingual Q6H PRN Benetta Spar D, RPH       leflunomide  (ARAVA) tablet 20 mg  20 mg Oral Daily Ainsley Spinner, PA-C   20 mg at 06/26/22 1119   loratadine (CLARITIN) tablet 10 mg  10 mg Oral QHS Ainsley Spinner, PA-C   10 mg at 06/25/22 2210   methocarbamol (ROBAXIN) tablet 500 mg  500 mg Oral Q6H PRN Ainsley Spinner, PA-C   500 mg at 06/26/22 6010   Or   methocarbamol (ROBAXIN) 500 mg in dextrose 5 % 50 mL IVPB  500 mg Intravenous Q6H PRN Ainsley Spinner, PA-C       metoCLOPramide (REGLAN) tablet 5-10 mg  5-10 mg Oral Q8H PRN Ainsley Spinner, PA-C       Or   metoCLOPramide (REGLAN) injection 5-10 mg  5-10 mg Intravenous Q8H PRN Ainsley Spinner, PA-C  metoprolol succinate (TOPROL-XL) 24 hr tablet 12.5 mg  12.5 mg Oral Daily Ainsley Spinner, PA-C   12.5 mg at 06/26/22 0825   multivitamin with minerals tablet 1 tablet  1 tablet Oral Daily Caren Griffins, MD   1 tablet at 06/26/22 0825   ondansetron (ZOFRAN) tablet 4 mg  4 mg Oral Q6H PRN Ainsley Spinner, PA-C       Or   ondansetron [facility]) injection 4 mg  4 mg Intravenous Q6H PRN Ainsley Spinner, PA-C       oxyCODONE (Oxy IR/ROXICODONE) immediate release tablet 5-10 mg  5-10 mg Oral Q4H PRN Ainsley Spinner, PA-C   10 mg at 06/26/22 0841   polyethylene glycol (MIRALAX / GLYCOLAX) packet 17 g  17 g Oral Daily PRN Ainsley Spinner, PA-C       polyvinyl alcohol (LIQUIFILM TEARS) 1.4 % ophthalmic solution 1 drop  1 drop Both Eyes BID PRN Benetta Spar D, RPH       rosuvastatin (CRESTOR) tablet 10 mg  10 mg Oral Daily Ainsley Spinner, PA-C   10 mg at 06/25/22 2209     Discharge Medications: Please see discharge summary for a list of discharge medications.  Relevant Imaging Results:  Relevant Lab Results:   Additional Information SSN: [id number].  Pt is vaccinated for covid with multiple boosters.  Joanne Chars, LCSW

## 2022-06-26 NOTE — Progress Notes (Signed)
PROGRESS NOTE  [name] TIR:[id number] DOB: June 24, 1951 DOA: 06/21/2022 PCP: Lorrene Reid, PA-C   LOS: 5 days   Brief Narrative / Interim history: 71 year old female with history of RA, chronic diastolic CHF, HTN, HLD, OSA comes in with a fall.  She was cleaning her house, they are looking to downsize, and apparently tripped and had a ground-level fall.  She experienced right-sided hip pain and came to the hospital.  Imaging in the ED showed a periprosthetic distal femur fracture, orthopedic surgery was consulted and we are asked to admit  Subjective / 24h Interval events: Doing well, wants SNF now that her insurance denied CIR  Assesement and Plan: Principal Problem:   Femur fracture ([place]) Active Problems:   OSA (obstructive sleep apnea)   Rheumatoid arthritis (HCC)   PVC's (premature ventricular contractions)   Class 2 obesity due to excess calories with body mass index (BMI) of 36.0 to 36.9 in adult   Chronic diastolic CHF (congestive heart failure) ([place])   Chronic kidney disease, stage 3b ([place])   DNR (do not resuscitate)  Principal problem Periprosthetic distal femur fracture-orthopedic surgery consulted, appreciate input.  She was taken to the OR by Dr. Ginette Pitman on 11/9 and she is status post ORIF with plate.  PT recommends CIR to which I completely agree, however the insurance unilaterally declined to cover. Pursue SNF  Active problems Hypoxemia, concern for CAP-patient was noted to be in the 70s tonight after the surgery, while on CPAP.   Maybe she has a degree of atelectasis from being in bed and undergoing surgery.  Chest x-ray shows possible left lower lobe infiltrate, she did have a low-grade temp of 99.9 2 nights ago.  Definitely is at risk for developing pneumonia.  Short course of Augmentin for 3 days, today's day #3  RA -Continue home medications   Chronic diastolic CHF-Echo on 4/4 with preserved EF and grade 1 DD.  Currently appears euvolemic, blood pressure still  soft, hold diuretics  Colitis -Continue Colestid, hyoscyamine   PVCs-Continue amiodarone, Toprol XL   HTN-Continue Toprol XL   HLD-Continue fenofibrate, rosuvastatin   OSA-Continue CPAP   Stage 3b CKD-Appears to be stable at this time.  Continue to monitor    Scheduled Meds:  acetaminophen  650 mg Oral Q8H   amiodarone  200 mg Oral Daily   amoxicillin-clavulanate  1 tablet Oral Q12H   aspirin EC  81 mg Oral QHS   colestipol  2 g Oral QHS   docusate sodium  100 mg Oral BID   doxylamine (Sleep)  25 mg Oral QHS   enoxaparin (LOVENOX) injection  40 mg Subcutaneous Q24H   feeding supplement (GLUCERNA SHAKE)  237 mL Oral BID BM   fenofibrate  54 mg Oral QHS   gabapentin  600 mg Oral BID   hydroxychloroquine  200 mg Oral Q MTWThF   leflunomide  20 mg Oral Daily   loratadine  10 mg Oral QHS   metoprolol succinate  12.5 mg Oral Daily   multivitamin with minerals  1 tablet Oral Daily   rosuvastatin  10 mg Oral Daily   Continuous Infusions:  methocarbamol (ROBAXIN) IV     PRN Meds:.bisacodyl, HYDROmorphone (DILAUDID) injection, hyoscyamine, methocarbamol **OR** methocarbamol (ROBAXIN) IV, metoCLOPramide **OR** metoCLOPramide (REGLAN) injection, ondansetron **OR** ondansetron (ZOFRAN) IV, oxyCODONE, polyethylene glycol, polyvinyl alcohol  Current Outpatient Medications  Medication Instructions   amiodarone (PACERONE) 200 mg, Oral, Daily   aspirin EC 81 mg, Oral, Daily at bedtime   cetirizine (ZYRTEC) 10  mg, Oral, Daily   colestipol (COLESTID) 2 g, Oral, Daily at bedtime   cycloSPORINE (RESTASIS) 0.05 % ophthalmic emulsion 2 drops, Both Eyes, 2 times daily PRN   diphenoxylate-atropine (LOMOTIL) 2.5-0.025 MG per tablet 1 tablet, Oral, 4 times daily PRN   doxylamine (Sleep) (UNISOM) 25 mg, Oral, At bedtime PRN   [START ON 06/27/2022] enoxaparin (LOVENOX) 40 mg, Subcutaneous, Every 24 hours   fenofibrate (TRICOR) 48 mg, Oral, Daily   fluticasone (FLONASE) 50 MCG/ACT nasal spray 1  spray, Each Nare, Daily   gabapentin (NEURONTIN) 300 MG capsule TAKE 2 CAPSULES BY MOUTH 2 TIMES DAILY.   hydroxychloroquine (PLAQUENIL) 200 mg, Oral, Daily   hyoscyamine (ANASPAZ) 0.25 mg, Sublingual, Every 6 hours PRN   ibuprofen (ADVIL) 800 mg, Oral, Every 6 hours PRN   leflunomide (ARAVA) 20 mg, Oral, Daily   lidocaine (LIDODERM) 5 % PLACE 1 PATCH ONTO THE SKIN DAILY AS NEEDED FOR PAIN. REMOVE AND DISCARD PATCH WITHIN 12 HOURS OR AS DIRECTED BY MD.   metoprolol succinate (TOPROL-XL) 12.5 mg, Oral, Daily   Multiple Vitamin (MULTIVITAMIN WITH MINERALS) TABS tablet 1 tablet, Oral, Daily   NONFORMULARY OR COMPOUNDED ITEM Triamcinolone 0.1% & Silvadene cream 1:1- Apply as directed to affected areas as needed   omeprazole (PRILOSEC) 40 mg, Oral, As needed   oxyCODONE (OXY IR/ROXICODONE) 5 mg, Oral, Every 4 hours PRN   potassium chloride (KLOR-CON) 10 MEQ tablet 20 mEq, Oral, Daily   Probiotic Product (PROBIOTIC DAILY PO) Oral, Ultraflora IB Probiotic    promethazine (PHENERGAN) 25 MG tablet TAKE 1 TABLET BY MOUTH EVERY 6 HOURS AS NEEDED.   rosuvastatin (CRESTOR) 10 mg, Oral, Daily   Semaglutide (1 MG/DOSE) 1 mg, Injection, Weekly   spironolactone (ALDACTONE) 25 mg, Oral, Daily   Torsemide 40 mg, Oral, Daily   UNABLE TO FIND CPAP: AT bedtime; setting is "12"     Diet Orders (From admission, onward)     Start     Ordered   06/22/22 1358  Diet Heart Room service appropriate? Yes; Fluid consistency: Thin  Diet effective now       Question Answer Comment  Room service appropriate? Yes   Fluid consistency: Thin      06/22/22 1357            DVT prophylaxis: enoxaparin (LOVENOX) injection 40 mg Start: 06/23/22 0800 SCDs Start: 06/22/22 1358 SCDs Start: 06/21/22 1611   Lab Results  Component Value Date   PLT 176 06/25/2022      Code Status: Full Code  Family Communication: daughter at bedside   Status is: Inpatient  Remains inpatient appropriate because: ready for  SNF Level of care: Med-Surg  Consultants:  Orthopedic surgery  Objective: Vitals:   06/25/22 2200 06/26/22 0600 06/26/22 0730 06/26/22 0825  BP: (!) 105/48 (!) 102/57 (!) 112/54 (!) 121/55  Pulse: 68 65 65   Resp: 18  18   Temp: 98 F (36.7 C)  98.7 F (37.1 C)   TempSrc: Oral  Oral   SpO2: 99% 94% 100%   Weight:      Height:        Intake/Output Summary (Last 24 hours) at 06/26/2022 1312 Last data filed at 06/25/2022 1617 Gross per 24 hour  Intake --  Output 200 ml  Net -200 ml    Wt Readings from Last 3 Encounters:  06/23/22 102.9 kg  05/25/22 88.5 kg  05/08/22 89.8 kg    Examination:  Constitutional: nad Respiratory: CTA Cardiovascular: RRR  Data Reviewed:  I have independently reviewed following labs and imaging studies   CBC Recent Labs  Lab 06/21/22 1620 06/22/22 0742 06/23/22 0344 06/24/22 0155 06/25/22 0211  WBC 7.8 5.9 8.2 6.7 5.9  HGB 12.0 10.9* 10.3* 9.5* 9.5*  HCT 37.3 33.6* 31.8* 30.4* 29.3*  PLT 207 164 160 162 176  MCV 88.6 89.1 88.8 90.7 89.3  MCH 28.5 28.9 28.8 28.4 29.0  MCHC 32.2 32.4 32.4 31.3 32.4  RDW 14.0 14.0 13.7 13.8 13.8     Recent Labs  Lab 06/21/22 1620 06/22/22 0742 06/23/22 0344 06/24/22 0155 06/25/22 0211  NA 141 138 135 137 136  K 3.6 3.9 4.2 4.3 4.2  CL 103 104 99 106 103  CO2 '25 25 26 26 26  '$ GLUCOSE 94 113* 139* 122* 106*  BUN 30* 26* '18 20 23  '$ CREATININE 1.51* 1.60* 1.34* 1.28* 1.28*  CALCIUM 8.8* 8.6* 8.5* 8.3* 8.2*  AST  --   --  26  --   --   ALT  --   --  15  --   --   ALKPHOS  --   --  45  --   --   BILITOT  --   --  0.7  --   --   ALBUMIN  --   --  2.8*  --   --   MG  --   --  2.2  --  2.4  INR 1.1  --   --   --   --      ------------------------------------------------------------------------------------------------------------------ No results for input(s): "CHOL", "HDL", "LDLCALC", "TRIG", "CHOLHDL", "LDLDIRECT" in the last 72 hours.  Lab Results  Component Value Date   HGBA1C 5.3  03/27/2022   ------------------------------------------------------------------------------------------------------------------ No results for input(s): "TSH", "T4TOTAL", "T3FREE", "THYROIDAB" in the last 72 hours.  Invalid input(s): "FREET3"  Cardiac Enzymes No results for input(s): "CKMB", "TROPONINI", "MYOGLOBIN" in the last 168 hours.  Invalid input(s): "CK" ------------------------------------------------------------------------------------------------------------------    Component Value Date/Time   BNP 44.4 09/26/2017 1325    CBG: No results for input(s): "GLUCAP" in the last 168 hours.  Recent Results (from the past 240 hour(s))  Surgical pcr screen     Status: None   Collection Time: 06/22/22 10:10 AM   Specimen: Nasal Mucosa; Nasal Swab  Result Value Ref Range Status   MRSA, PCR NEGATIVE NEGATIVE Final   Staphylococcus aureus NEGATIVE NEGATIVE Final    Comment: (NOTE) The Xpert SA Assay (FDA approved for NASAL specimens in patients 73 years of age and older), is one component of a comprehensive surveillance program. It is not intended to diagnose infection nor to guide or monitor treatment. Performed at [facility], [place] [address], [place], [place] [address]   Urine Culture     Status: None   Collection Time: 06/22/22 12:41 PM   Specimen: Urine, Clean Catch  Result Value Ref Range Status   Specimen Description URINE, CLEAN CATCH  Final   Special Requests Normal  Final   Culture   Final    NO GROWTH Performed at [facility], [place] [address], [place], [place] [address]    Report Status 06/23/2022 FINAL  Final     Radiology Studies: No results found.   Marzetta Board, MD, PhD [facility]  Between 7 am - 7 pm I am available, please contact me via Amion (for emergencies) or Securechat (non urgent messages)  Between 7 pm - 7 am I am not available, please contact night coverage MD/APP via  Amion

## 2022-06-27 DIAGNOSIS — G4733 Obstructive sleep apnea (adult) (pediatric): Secondary | ICD-10-CM | POA: Diagnosis not present

## 2022-06-27 DIAGNOSIS — I5032 Chronic diastolic (congestive) heart failure: Secondary | ICD-10-CM | POA: Diagnosis not present

## 2022-06-27 DIAGNOSIS — N1832 Chronic kidney disease, stage 3b: Secondary | ICD-10-CM | POA: Diagnosis not present

## 2022-06-27 NOTE — TOC Progression Note (Addendum)
Transition of Care [facility]) - Progression Note    Patient Details  Name: [name] MRN: [id number] Date of Birth: 1950/09/12  [facility]) CM/SW Contact  Joanne Chars, LCSW Phone Number: 06/27/2022, 8:48 AM  Clinical Narrative:   Josem Kaufmann request made to HTA.  1045: TC daughter [name].  She will be visiting [place] shortly and will call back afterwards.   1115: TC Kristi.  They want to accept offer at [facility].  [facility] informed.  HTA informed.   1140: [facility] will have bed available tomorrow.  Expected Discharge Plan: [place] Barriers to Discharge: SNF Pending bed offer  Expected Discharge Plan and Services Expected Discharge Plan: [place] In-house Referral: Clinical Social Work   Post Acute Care Choice: [place] Living arrangements for the past 2 months: Single Family Home Expected Discharge Date: 06/27/22                                     Social Determinants of Health (SDOH) Interventions    Readmission Risk Interventions     No data to display

## 2022-06-27 NOTE — Progress Notes (Signed)
Pt has a home CPAP at bedside, pt stated she will place on herself when ready.

## 2022-06-28 ENCOUNTER — Encounter (HOSPITAL_COMMUNITY): Payer: Self-pay | Admitting: Orthopedic Surgery

## 2022-06-28 DIAGNOSIS — M3500 Sicca syndrome, unspecified: Secondary | ICD-10-CM | POA: Diagnosis not present

## 2022-06-28 DIAGNOSIS — Z8639 Personal history of other endocrine, nutritional and metabolic disease: Secondary | ICD-10-CM | POA: Diagnosis not present

## 2022-06-28 DIAGNOSIS — M79661 Pain in right lower leg: Secondary | ICD-10-CM | POA: Diagnosis not present

## 2022-06-28 DIAGNOSIS — N1832 Chronic kidney disease, stage 3b: Secondary | ICD-10-CM | POA: Diagnosis not present

## 2022-06-28 DIAGNOSIS — I5022 Chronic systolic (congestive) heart failure: Secondary | ICD-10-CM | POA: Diagnosis not present

## 2022-06-28 DIAGNOSIS — E119 Type 2 diabetes mellitus without complications: Secondary | ICD-10-CM | POA: Diagnosis not present

## 2022-06-28 DIAGNOSIS — S72401A Unspecified fracture of lower end of right femur, initial encounter for closed fracture: Secondary | ICD-10-CM | POA: Diagnosis not present

## 2022-06-28 DIAGNOSIS — Z7689 Persons encountering health services in other specified circumstances: Secondary | ICD-10-CM | POA: Diagnosis not present

## 2022-06-28 DIAGNOSIS — M25511 Pain in right shoulder: Secondary | ICD-10-CM | POA: Diagnosis not present

## 2022-06-28 DIAGNOSIS — E559 Vitamin D deficiency, unspecified: Secondary | ICD-10-CM | POA: Diagnosis not present

## 2022-06-28 DIAGNOSIS — M810 Age-related osteoporosis without current pathological fracture: Secondary | ICD-10-CM | POA: Diagnosis not present

## 2022-06-28 DIAGNOSIS — I509 Heart failure, unspecified: Secondary | ICD-10-CM | POA: Diagnosis not present

## 2022-06-28 DIAGNOSIS — R4182 Altered mental status, unspecified: Secondary | ICD-10-CM | POA: Diagnosis not present

## 2022-06-28 DIAGNOSIS — S72401D Unspecified fracture of lower end of right femur, subsequent encounter for closed fracture with routine healing: Secondary | ICD-10-CM | POA: Diagnosis not present

## 2022-06-28 DIAGNOSIS — M533 Sacrococcygeal disorders, not elsewhere classified: Secondary | ICD-10-CM | POA: Diagnosis not present

## 2022-06-28 DIAGNOSIS — N390 Urinary tract infection, site not specified: Secondary | ICD-10-CM | POA: Diagnosis not present

## 2022-06-28 DIAGNOSIS — I10 Essential (primary) hypertension: Secondary | ICD-10-CM | POA: Diagnosis not present

## 2022-06-28 DIAGNOSIS — S72321D Displaced transverse fracture of shaft of right femur, subsequent encounter for closed fracture with routine healing: Secondary | ICD-10-CM | POA: Diagnosis not present

## 2022-06-28 DIAGNOSIS — M1712 Unilateral primary osteoarthritis, left knee: Secondary | ICD-10-CM | POA: Diagnosis not present

## 2022-06-28 DIAGNOSIS — R2689 Other abnormalities of gait and mobility: Secondary | ICD-10-CM | POA: Diagnosis not present

## 2022-06-28 DIAGNOSIS — R350 Frequency of micturition: Secondary | ICD-10-CM | POA: Diagnosis not present

## 2022-06-28 DIAGNOSIS — E785 Hyperlipidemia, unspecified: Secondary | ICD-10-CM | POA: Diagnosis not present

## 2022-06-28 DIAGNOSIS — M545 Low back pain, unspecified: Secondary | ICD-10-CM | POA: Diagnosis not present

## 2022-06-28 DIAGNOSIS — Z7401 Bed confinement status: Secondary | ICD-10-CM | POA: Diagnosis not present

## 2022-06-28 DIAGNOSIS — J329 Chronic sinusitis, unspecified: Secondary | ICD-10-CM | POA: Diagnosis not present

## 2022-06-28 DIAGNOSIS — M6281 Muscle weakness (generalized): Secondary | ICD-10-CM | POA: Diagnosis not present

## 2022-06-28 DIAGNOSIS — R600 Localized edema: Secondary | ICD-10-CM | POA: Diagnosis not present

## 2022-06-28 DIAGNOSIS — Z9181 History of falling: Secondary | ICD-10-CM | POA: Diagnosis not present

## 2022-06-28 DIAGNOSIS — M0579 Rheumatoid arthritis with rheumatoid factor of multiple sites without organ or systems involvement: Secondary | ICD-10-CM | POA: Diagnosis not present

## 2022-06-28 DIAGNOSIS — Z96651 Presence of right artificial knee joint: Secondary | ICD-10-CM | POA: Diagnosis not present

## 2022-06-28 DIAGNOSIS — G4733 Obstructive sleep apnea (adult) (pediatric): Secondary | ICD-10-CM | POA: Diagnosis not present

## 2022-06-28 DIAGNOSIS — I7300 Raynaud's syndrome without gangrene: Secondary | ICD-10-CM | POA: Diagnosis not present

## 2022-06-28 DIAGNOSIS — Z79899 Other long term (current) drug therapy: Secondary | ICD-10-CM | POA: Diagnosis not present

## 2022-06-28 DIAGNOSIS — R269 Unspecified abnormalities of gait and mobility: Secondary | ICD-10-CM | POA: Diagnosis not present

## 2022-06-28 DIAGNOSIS — K219 Gastro-esophageal reflux disease without esophagitis: Secondary | ICD-10-CM | POA: Diagnosis not present

## 2022-06-28 DIAGNOSIS — M069 Rheumatoid arthritis, unspecified: Secondary | ICD-10-CM | POA: Diagnosis not present

## 2022-06-28 DIAGNOSIS — S72451D Displaced supracondylar fracture without intracondylar extension of lower end of right femur, subsequent encounter for closed fracture with routine healing: Secondary | ICD-10-CM | POA: Diagnosis not present

## 2022-06-28 DIAGNOSIS — I5032 Chronic diastolic (congestive) heart failure: Secondary | ICD-10-CM | POA: Diagnosis not present

## 2022-06-28 DIAGNOSIS — M5136 Other intervertebral disc degeneration, lumbar region: Secondary | ICD-10-CM | POA: Diagnosis not present

## 2022-06-28 DIAGNOSIS — R2242 Localized swelling, mass and lump, left lower limb: Secondary | ICD-10-CM | POA: Diagnosis not present

## 2022-06-28 DIAGNOSIS — R278 Other lack of coordination: Secondary | ICD-10-CM | POA: Diagnosis not present

## 2022-06-28 DIAGNOSIS — Z66 Do not resuscitate: Secondary | ICD-10-CM | POA: Diagnosis not present

## 2022-06-28 DIAGNOSIS — M797 Fibromyalgia: Secondary | ICD-10-CM | POA: Diagnosis not present

## 2022-06-28 DIAGNOSIS — R931 Abnormal findings on diagnostic imaging of heart and coronary circulation: Secondary | ICD-10-CM | POA: Diagnosis not present

## 2022-06-28 MED ORDER — METHOCARBAMOL 500 MG PO TABS: 500.0000 mg | ORAL_TABLET | Freq: Four times a day (QID) | ORAL | Status: DC | PRN

## 2022-06-28 NOTE — Progress Notes (Signed)
Mobility Specialist Progress Note   06/28/22 1400  Mobility  Activity Transferred to/from [facility];Transferred from chair to bed  Level of Assistance Minimal assist, patient does 75% or more  Assistive Device Front wheel walker  Range of Motion/Exercises Active;All extremities  RLE Weight Bearing WBAT  Activity Response Tolerated well   Patient received in chair requesting assistance to [facility] for BM. Transferred to [facility] and PTAR arrived for DC to facility. Required total A for pericare then transferred to stretcher minA-MinG. Tolerated without complaint or incident. Was left with PTAR with all needs met.   [place] Nas Wafer, BS EXP Mobility Specialist Please contact via SecureChat or Rehab office at [phone]

## 2022-06-28 NOTE — TOC Progression Note (Addendum)
Transition of Care [facility]) - Progression Note    Patient Details  Name: [name] MRN: [id number] Date of Birth: 01/16/1951  [facility] CM/SW Contact  Joanne Chars, LCSW Phone Number: 06/28/2022, 9:55 AM  Clinical Narrative:   Lavonne Chick, HTA.  Auth request has been sent to MD for med review.  Still pending.   1315: Auth approved by HTA: SNF [id number], [facility].  [facility] and pt both notified.  Expected Discharge Plan: [place] Barriers to Discharge: SNF Pending bed offer  Expected Discharge Plan and Services Expected Discharge Plan: [place] In-house Referral: Clinical Social Work   Post Acute Care Choice: [place] Living arrangements for the past 2 months: Single Family Home Expected Discharge Date: 06/27/22                                     Social Determinants of Health (SDOH) Interventions    Readmission Risk Interventions     No data to display

## 2022-06-28 NOTE — Progress Notes (Signed)
Physical Therapy Treatment Patient Details Name: [name] MRN: [id number] DOB: 01/16/1951 Today's Date: 06/28/2022   History of Present Illness Patient is a 71 year old female with history of RA, chronic diastolic CHF, HTN, HLD, OSA comes in with a fall. She was cleaning her house, they are looking to downsize, and apparently tripped and had a ground-level fall.  She experienced right-sided hip pain and came to the hospital.  Imaging in the ED showed a periprosthetic distal femur fracture s/p ORIF of right distance femur. Also with recent complaints of right ankle pain also limiting weightbearing.    PT Comments    Pt received in supine, agreeable to therapy session after premedication and with good effort for transfer and gait training at bedside. Pt able to progress to ~17f with RW and minA prior to needing seated break, x2 trials. Pt needing bed height elevated and up to maxA for sit<>stand to RW with use of momentum strategy. Encouraged pt to sit up at least one hour but not more than 2 hours at a time to protect her skin. Pt continues to benefit from PT services to progress toward functional mobility goals.   Recommendations for follow up therapy are one component of a multi-disciplinary discharge planning process, led by the attending physician.  Recommendations may be updated based on patient status, additional functional criteria and insurance authorization.  Follow Up Recommendations  Skilled nursing-short term rehab (<3 hours/day) Can patient physically be transported by private vehicle: No   Assistance Recommended at Discharge Frequent or constant Supervision/Assistance  Patient can return home with the following A little help with bathing/dressing/bathroom;Assist for transportation;Help with stairs or ramp for entrance;Two people to help with walking and/or transfers;Assistance with cEducation officer, environmental(measurements PT);BSC/3in1 (if home)     Recommendations for Other Services       Precautions / Restrictions Precautions Precautions: Fall Precaution Comments: right ankle pain Required Braces or Orthoses: Other Brace Other Brace: Air Cast on R ankle Restrictions Weight Bearing Restrictions: Yes RLE Weight Bearing: Weight bearing as tolerated Other Position/Activity Restrictions: ROM as tolerated R knee and ankle     Mobility  Bed Mobility Overal bed mobility: Needs Assistance Bed Mobility: Supine to Sit     Supine to sit: Min guard, HOB elevated     General bed mobility comments: use of bed features/rails    Transfers Overall transfer level: Needs assistance Equipment used: Rolling walker (2 wheels), Ambulation equipment used Transfers: Sit to/from Stand, Bed to chair/wheelchair/BSC Sit to Stand: Mod assist, Max assist, From elevated surface (maxA from elevated EOB, modA from recliner)           General transfer comment: Min instructional cueing for technique to complete sit to stand with one hand on the walker and one pushing up from the surface, pt did better pushing from chair with both UE on second trial. Pt needs heavy cues for anterior lean as she tends to lean back somewhat when standing. x4 total attempts as pt unable to stand on initial attempt from EOB    Ambulation/Gait Ambulation/Gait assistance: Min assist Gait Distance (Feet): 15 Feet (158f seated break, 1029fAssistive device: Rolling walker (2 wheels) Gait Pattern/deviations: Step-through pattern, Decreased stride length, Decreased stance time - right, Decreased weight shift to right Gait velocity: decreased   Pre-gait activities: lateral steps toward chair prior to forward gait progression in room General Gait Details: Slow, mostly steady gait with Min A for balance, antalgic RLE; no dizziness  reported, pain moderate after premedication.   Stairs             Wheelchair Mobility    Modified Rankin (Stroke Patients Only)        Balance Overall balance assessment: Needs assistance Sitting-balance support: Feet supported, No upper extremity supported Sitting balance-Leahy Scale: Fair     Standing balance support: During functional activity, Bilateral upper extremity supported, Reliant on assistive device for balance Standing balance-Leahy Scale: Poor Standing balance comment: Reliant on BUE support for balance.                            Cognition Arousal/Alertness: Awake/alert Behavior During Therapy: WFL for tasks assessed/performed Overall Cognitive Status: Within Functional Limits for tasks assessed                                 General Comments: Anxious prior to mobility but good effort throughout.        Exercises General Exercises - Lower Extremity Ankle Circles/Pumps: AROM, Strengthening, 10 reps, Supine, Both Long Arc Quad: AROM, Right, 10 reps, Seated Other Exercises Other Exercises: IS x 10 reps ~1,100-1,300 mL    General Comments General comments (skin integrity, edema, etc.): R lateral thigh dressing c/d/i no visible drainage      Pertinent Vitals/Pain Pain Assessment Pain Assessment: Faces Faces Pain Scale: Hurts little more Pain Location: right lateral thigh over incisional area Pain Descriptors / Indicators: Discomfort, Grimacing Pain Intervention(s): Monitored during session, Premedicated before session, Repositioned, Ice applied    Home Living                          Prior Function            PT Goals (current goals can now be found in the care plan section) Acute Rehab PT Goals Patient Stated Goal: to regain independence PT Goal Formulation: With patient/family Time For Goal Achievement: 06/30/22 Progress towards PT goals: Progressing toward goals    Frequency    Min 3X/week      PT Plan Current plan remains appropriate    Co-evaluation              AM-PAC PT "6 Clicks" Mobility   Outcome Measure  Help  needed turning from your back to your side while in a flat bed without using bedrails?: A Little Help needed moving from lying on your back to sitting on the side of a flat bed without using bedrails?: A Little Help needed moving to and from a bed to a chair (including a wheelchair)?: A Lot Help needed standing up from a chair using your arms (e.g., wheelchair or bedside chair)?: A Lot Help needed to walk in hospital room?: Total (<24f) Help needed climbing 3-5 steps with a railing? : Total 6 Click Score: 12    End of Session Equipment Utilized During Treatment: Gait belt (air cast RLE) Activity Tolerance: Patient tolerated treatment well Patient left: in chair;with call bell/phone within reach;with chair alarm set;with family/visitor present Nurse Communication: Mobility status;Need for lift equipment;Other (comment) (Stedy with +2 assist if pt has trouble standing to RW) PT Visit Diagnosis: Muscle weakness (generalized) (M62.81);Difficulty in walking, not elsewhere classified (R26.2);Pain Pain - Right/Left: Right Pain - part of body: Leg     Time: 13235-5732PT Time Calculation (min) (ACUTE ONLY): 30 min  Charges:  $Gait Training:  8-22 mins $Therapeutic Activity: 8-22 mins                     Brynnley Dayrit P., PTA Acute Rehabilitation Services Secure Chat Preferred 9a-5:30pm Office: [place] 06/28/2022, 12:23 PM

## 2022-06-28 NOTE — Discharge Instructions (Signed)
Orthopaedic Discharge instructions    - Right periprosthetic distal femur fracture s/p fall              WBAT R leg             ROM as tolerated R knee             PT/OT daily             Ice and elevate for swelling and pain control                          Dressing changes as needed right thigh   Ok to leave open to air    Ok to [employer] and clean with soap and water only    PT- please teach HEP for R knee ROM- AROM, PROM. No ROM restrictions.  Quad sets, SLR, LAQ, SAQ, heel slides, stretching   Ankle theraband program, heel cord stretching, toe towel curls, etc   No pillows under bend of knee when at rest, ok to place under heel to help work on extension. Can also use zero knee bone foam if available   - Right distal fibula fracture             Non-op              WBAT in air cast                         Only needs to be on when mobilizing  [place]   Discharge Pin Site Instructions  Dress pins daily with Kerlix roll starting on POD 2. Wrap the Kerlix so that it tamps the skin down around the pin-skin interface to prevent/limit motion of the skin relative to the pin.  (Pin-skin motion is the primary cause of pain and infection related to external fixator pin sites).  Remove any crust or coagulum that may obstruct drainage with soap and water.  After POD 3, if there is no discernable drainage on the pin site dressing, the interval for change can by increased to every other day.  You may shower with the fixator, cleaning all pin sites gently with soap and water.  If you have a surgical wound this needs to be completely dry and without drainage before showering. Alternatively you can use a washcloth with soap and water and gently clean the injured extremity and external fixator, including all pinsites and surgical wounds   The extremity can be lifted by the fixator to facilitate wound care and transfers.  Notify the office/Doctor if you experience  increasing drainage, redness, or pain from a pin site, or if you notice purulent (thick, snot-like) drainage. As we discussed pin tract infections are common in this is most likely as a result of mechanical irritation from the skin pin interface. Primary treatment is hygiene and cleaning with soap and water. If this does not resolve with regular cleaning contact the office  Discharge Wound Care Instructions  Do NOT apply any ointments, solutions or lotions to pin sites or surgical wounds.  These prevent needed drainage and even though solutions like hydrogen peroxide kill bacteria, they also damage cells lining the pin sites that help fight infection.  Applying lotions or ointments can keep the wounds moist and can cause them to breakdown and open up as well. This can increase the risk for infection. When in doubt call the office.  Surgical incisions should  be dressed daily.  If any drainage is noted, use one layer of adaptic or Mepitel, then gauze, Kerlix, and an ace wrap. Alternatively you can use a silicone foam dressing such as a mepilex   [email or web address]  These dressing supplies should be available at local medical supply stores ([facility], [facility], etc). They are not usually carried at places like [facility], [facility], [facility], etc  Once the incision is completely dry and without drainage, it may be left open to air out.  Showering may begin 36-48 hours later.  Cleaning gently with soap and water.  Traumatic wounds should be dressed daily as well.    One layer of adaptic, gauze, Kerlix, then ace wrap.  The adaptic can be discontinued once the draining has ceased    If you have a wet to dry dressing: wet the gauze with saline the squeeze as much saline out so the gauze is moist (not soaking wet), place  moistened gauze over wound, then place a dry gauze over the moist one, followed by Kerlix wrap, then ace wrap.

## 2022-06-28 NOTE — Discharge Summary (Addendum)
Patient stable for d/c.  Await insurance authorization     Physician Discharge Summary  [name] LPF:[id number] DOB: 1951/05/31 DOA: 06/21/2022  PCP: Lorrene Reid, PA-C  Admit date: 06/21/2022 Discharge date: 06/28/2022  Admitted From: home Disposition:  SNF  Recommendations for Outpatient Follow-up:  Follow up with PCP in 1-2 weeks Follow-up with orthopedic surgery in 2 weeks Repeat BMP/CBC in 3 to 4 days  Home Health: none Equipment/Devices: none  Discharge Condition: stable CODE STATUS: Full code  HPI: Per admitting MD, [name] is a 71 y.o. female with medical history significant of RA; chronic diastolic CHF; HTN; HLD; and OSA presenting with a fall.   She reports that they are in the process of cleaning out her house to downsize.  She was moving clothes and placed things on her rolling walker seat.  The clothes fell off and got caught in the wheel of the walker, causing her to trip.  She landed directly on her R side.   Hospital Course / Discharge diagnoses: Principal Problem:   Femur fracture ([place]) Active Problems:   OSA (obstructive sleep apnea)   Rheumatoid arthritis (HCC)   PVC's (premature ventricular contractions)   Class 2 obesity due to excess calories with body mass index (BMI) of 36.0 to 36.9 in adult   Chronic diastolic CHF (congestive heart failure) (HCC)   Chronic kidney disease, stage 3b (HCC)   DNR (do not resuscitate)   Principal problem Periprosthetic distal femur fracture-orthopedic surgery consulted, appreciate input.  She was taken to the OR by Dr. Ginette Pitman on 11/9 and she is status post ORIF with plate.  She will be discharged to rehab   Active problems Hypoxemia, concern for CAP-patient's sats were noted to be in the 70s after the surgery, while on CPAP.   Maybe she has a degree of atelectasis from being in bed and undergoing surgery.  Chest x-ray shows possible left lower lobe infiltrate, she did have a low-grade temp of 99.9 2 nights  ago.  Definitely is at risk for developing pneumonia.  She completed a short course of Augmentin, currently on room air, stable, no further respiratory problems. RA -Continue home medications Chronic diastolic CHF-Echo on 4/4 with preserved EF and grade 1 DD.  Continue home medications Colitis -Continue Colestid, hyoscyamine PVCs-Continue amiodarone, Toprol XL HTN-Continue Toprol XL HLD-Continue fenofibrate, rosuvastatin OSA-Continue CPAP Stage 3b CKD-Appears to be stable at this time.   Sepsis ruled out   Discharge Instructions   Allergies as of 06/28/2022       Reactions   Sulfa Antibiotics Hives, Itching   Flushing   Cymbalta [duloxetine Hcl] Other (See Comments)   Manic reaction   Demerol Hives, Other (See Comments)   Fever    Ivp Dye [iodinated Contrast Media] Hives, Other (See Comments)   Fever   Morphine And Related Other (See Comments)   ineffective   Sulfasalazine Hives, Other (See Comments)   Flushing   Adhesive [tape] Rash        Medication List     TAKE these medications    amiodarone 200 MG tablet Commonly known as: PACERONE Take 1 tablet (200 mg total) by mouth daily.   aspirin EC 81 MG tablet Take 81 mg by mouth at bedtime.   cetirizine 10 MG tablet Commonly known as: ZYRTEC Take 1 tablet (10 mg total) by mouth daily. What changed: when to take this   colestipol 1 g tablet Commonly known as: COLESTID Take 2 g by mouth at bedtime.  cycloSPORINE 0.05 % ophthalmic emulsion Commonly known as: RESTASIS Place 2 drops into both eyes 2 (two) times daily as needed (irritation).   diphenoxylate-atropine 2.5-0.025 MG tablet Commonly known as: LOMOTIL Take 1 tablet by mouth 4 (four) times daily as needed for diarrhea or loose stools.   doxylamine (Sleep) 25 MG tablet Commonly known as: UNISOM Take 1 tablet (25 mg total) by mouth at bedtime as needed. What changed: when to take this   enoxaparin 40 MG/0.4ML injection Commonly known as:  LOVENOX Inject 0.4 mLs (40 mg total) into the skin daily.   fenofibrate 48 MG tablet Commonly known as: Tricor Take 1 tablet (48 mg total) by mouth daily. What changed: when to take this   fluticasone 50 MCG/ACT nasal spray Commonly known as: FLONASE Place 1 spray into both nostrils daily. What changed:  when to take this reasons to take this   gabapentin 300 MG capsule Commonly known as: NEURONTIN TAKE 2 CAPSULES BY MOUTH 2 TIMES DAILY. What changed: See the new instructions.   hydroxychloroquine 200 MG tablet Commonly known as: PLAQUENIL Take 1 tablet (200 mg total) by mouth daily. What changed: when to take this   hyoscyamine 0.125 MG Tbdp disintergrating tablet Commonly known as: ANASPAZ Place 0.25 mg under the tongue every 6 (six) hours as needed for cramping.   ibuprofen 200 MG tablet Commonly known as: ADVIL Take 800 mg by mouth every 6 (six) hours as needed for mild pain.   leflunomide 20 MG tablet Commonly known as: ARAVA TAKE 1 TABLET (20 MG TOTAL) BY MOUTH DAILY.   lidocaine 5 % Commonly known as: LIDODERM PLACE 1 PATCH ONTO THE SKIN DAILY AS NEEDED FOR PAIN. REMOVE AND DISCARD PATCH WITHIN 12 HOURS OR AS DIRECTED BY MD. What changed:  how much to take how to take this when to take this reasons to take this additional instructions   methocarbamol 500 MG tablet Commonly known as: ROBAXIN Take 1 tablet (500 mg total) by mouth every 6 (six) hours as needed for muscle spasms.   metoprolol succinate 25 MG 24 hr tablet Commonly known as: TOPROL-XL Take 0.5 tablets (12.5 mg total) by mouth daily.   multivitamin with minerals Tabs tablet Take 1 tablet by mouth daily.   NONFORMULARY OR COMPOUNDED ITEM Triamcinolone 0.1% & Silvadene cream 1:1- Apply as directed to affected areas as needed   omeprazole 40 MG capsule Commonly known as: PRILOSEC Take 40 mg by mouth as needed (nausea).   oxyCODONE 5 MG immediate release tablet Commonly known as: Oxy  IR/ROXICODONE Take 1 tablet (5 mg total) by mouth every 4 (four) hours as needed for breakthrough pain ((for MODERATE breakthrough pain)).   potassium chloride 10 MEQ tablet Commonly known as: KLOR-CON Take 2 tablets (20 mEq total) by mouth daily.   PROBIOTIC DAILY PO Take by mouth. Ultraflora IB Probiotic   promethazine 25 MG tablet Commonly known as: PHENERGAN TAKE 1 TABLET BY MOUTH EVERY 6 HOURS AS NEEDED. What changed: reasons to take this   rosuvastatin 10 MG tablet Commonly known as: CRESTOR Take 1 tablet (10 mg total) by mouth daily.   Semaglutide (1 MG/DOSE) 4 MG/3ML Sopn Inject 1 mg as directed once a week. What changed: when to take this   spironolactone 25 MG tablet Commonly known as: ALDACTONE Take 1 tablet (25 mg total) by mouth daily. What changed: how much to take   Torsemide 40 MG Tabs Take 40 mg by mouth daily.   UNABLE TO FIND CPAP: AT  bedtime; setting is "12"        Contact information for follow-up providers     Altamese Taney, MD. Schedule an appointment as soon as possible for a visit in 2 week(s).   Specialty: Orthopedic Surgery Contact information: [place] [address] [phone]              Contact information for after-discharge care     Destination     [facility] .   Service: Skilled Nursing Contact information: [address] [place] [phone]                     Consultations: Orthopedic surgery   Procedures/Studies:  DG Ankle Complete Right  Result Date: 06/23/2022 CLINICAL DATA:  Fall EXAM: RIGHT ANKLE - COMPLETE 3 VIEW COMPARISON:  None Available. FINDINGS: Subtle lucency of the distal fibula which is concerning for nondisplaced fracture, best seen on oblique view. No evidence of dislocation. Calcaneal spur. Mild degenerative changes of the partially visualized midfoot. Soft tissue calcifications, likely phleboliths. Ankle soft  tissue edema. IMPRESSION: Subtle lucency of the distal fibula which is concerning for nondisplaced fracture. Correlate for point tenderness. Electronically Signed   By: Yetta Glassman M.D.   On: 06/23/2022 12:09   DG CHEST PORT 1 VIEW  Result Date: 06/23/2022 CLINICAL DATA:  Hypoxemia status post recent ORIF distal femur fracture EXAM: PORTABLE CHEST 1 VIEW COMPARISON:  Chest radiograph dated 04/09/2020 FINDINGS: Asymmetrically decreased left lung volume. Increased hazy left lung opacity and patchy left retrocardiac opacities. No pleural effusion or pneumothorax. Similar enlarged cardiomediastinal silhouette. The visualized skeletal structures are unremarkable. IMPRESSION: Asymmetrically decreased left lung volume and increased hazy left lung opacity and patchy left retrocardiac opacities, likely atelectasis. Electronically Signed   By: Darrin Nipper M.D.   On: 06/23/2022 10:32   DG FEMUR PORT, MIN 2 VIEWS RIGHT  Result Date: 06/22/2022 CLINICAL DATA:  Fracture, postop. EXAM: PORTABLE RIGHT KNEE - 1-2 VIEW; RIGHT FEMUR PORTABLE 2 VIEW COMPARISON:  Preoperative imaging. FINDINGS: Lateral plate and multi screw fixation of distal femur fracture. The fracture is in improved alignment with mild persistent displacement. There is no new periprosthetic lucency. Right knee arthroplasty is in place. Alignment remains congruent. Recent postsurgical change includes air and edema in the joint space and soft tissues. IMPRESSION: ORIF distal femur fracture, in improved alignment. No immediate postoperative complication. Electronically Signed   By: Keith Rake M.D.   On: 06/22/2022 15:08   DG Knee Right Port  Result Date: 06/22/2022 CLINICAL DATA:  Fracture, postop. EXAM: PORTABLE RIGHT KNEE - 1-2 VIEW; RIGHT FEMUR PORTABLE 2 VIEW COMPARISON:  Preoperative imaging. FINDINGS: Lateral plate and multi screw fixation of distal femur fracture. The fracture is in improved alignment with mild persistent displacement. There  is no new periprosthetic lucency. Right knee arthroplasty is in place. Alignment remains congruent. Recent postsurgical change includes air and edema in the joint space and soft tissues. IMPRESSION: ORIF distal femur fracture, in improved alignment. No immediate postoperative complication. Electronically Signed   By: Keith Rake M.D.   On: 06/22/2022 15:08   DG FEMUR, MIN 2 VIEWS RIGHT  Result Date: 06/22/2022 CLINICAL DATA:  Elective surgery. EXAM: RIGHT FEMUR 2 VIEWS COMPARISON:  Preoperative imaging. FINDINGS: Six fluoroscopic spot views of the right femur obtained in the operating room. Lateral plate and multi screw fixation of distal femur fracture. Fluoroscopy time 40 seconds. Dose 3.95 mGy. IMPRESSION: Intraoperative fluoroscopy during distal femur fracture  ORIF. Electronically Signed   By: Keith Rake M.D.   On: 06/22/2022 14:04   DG C-Arm 1-60 Min-No Report  Result Date: 06/22/2022 Fluoroscopy was utilized by the requesting physician.  No radiographic interpretation.   DG C-Arm 1-60 Min-No Report  Result Date: 06/22/2022 Fluoroscopy was utilized by the requesting physician.  No radiographic interpretation.   DG Cervical Spine With Flex & Extend  Result Date: 06/21/2022 CLINICAL DATA:  Rheumatoid arthritis. EXAM: CERVICAL SPINE COMPLETE WITH FLEXION AND EXTENSION VIEWS COMPARISON:  None Available. FINDINGS: Evaluation is very limited due to positioning and superimposition of the soft tissues of the cervical spine. No definite acute fracture or subluxation. The bones are osteopenic. The visualized odontoid and the posterior elements appear intact. There is anatomic alignment of the lateral masses of C1 and C2. The soft tissues are grossly unremarkable. IMPRESSION: 1. Very limited study. No definite acute fracture or subluxation. 2. Osteopenia. Electronically Signed   By: Anner Crete M.D.   On: 06/21/2022 18:26   DG Femur Min 2 Views Right  Result Date: 06/21/2022 CLINICAL  DATA:  Fall EXAM: RIGHT FEMUR 2 VIEWS COMPARISON:  06/21/2022 FINDINGS: Right hip negative. Right knee replacement. Supracondylar fracture distal right femur with a 1/2 shaft with of lateral displacement. Nearly 1 shaft with posterior displacement. No change from earlier today IMPRESSION: Displaced supracondylar fracture distal right femur. Right knee replacement. No other femur fracture. Electronically Signed   By: Franchot Gallo M.D.   On: 06/21/2022 15:01   DG Knee Complete 4 Views Right  Result Date: 06/21/2022 CLINICAL DATA:  Pt tripped and fell onto right side onto hardwood floor today - having pain around right knee area, hx of right knee replacement concern for dislocated TKA EXAM: RIGHT KNEE - COMPLETE 4+ VIEW COMPARISON:  None Available. FINDINGS: Oblique fracture through the distal RIGHT femoral metaphysis just above the distal femoral prosthetic. 2 cm lateral displacement the distal fracture fragment. On lateral projection there is posterior angulation of the distal fracture fragment and override approximately 1.7 cm IMPRESSION: Acute oblique fracture through the distal femoral metaphysis above the prosthetic. Electronically Signed   By: Suzy Bouchard M.D.   On: 06/21/2022 14:54        The results of significant diagnostics from this hospitalization (including imaging, microbiology, ancillary and laboratory) are listed below for reference.     Microbiology: Recent Results (from the past 240 hour(s))  Surgical pcr screen     Status: None   Collection Time: 06/22/22 10:10 AM   Specimen: Nasal Mucosa; Nasal Swab  Result Value Ref Range Status   MRSA, PCR NEGATIVE NEGATIVE Final   Staphylococcus aureus NEGATIVE NEGATIVE Final    Comment: (NOTE) The Xpert SA Assay (FDA approved for NASAL specimens in patients 51 years of age and older), is one component of a comprehensive surveillance program. It is not intended to diagnose infection nor to guide or monitor treatment. Performed at  [facility], [place] [address] [place], [place] [address]   Urine Culture     Status: None   Collection Time: 06/22/22 12:41 PM   Specimen: Urine, Clean Catch  Result Value Ref Range Status   Specimen Description URINE, CLEAN CATCH  Final   Special Requests Normal  Final   Culture   Final    NO GROWTH Performed at [facility], [place] [address], [place], [place] [address]    Report Status 06/23/2022 FINAL  Final     Labs: Basic Metabolic Panel:  Recent Labs  Lab 06/21/22 1620 06/22/22 0742 06/23/22 0344 06/24/22 0155 06/25/22 0211  NA 141 138 135 137 136  K 3.6 3.9 4.2 4.3 4.2  CL 103 104 99 106 103  CO2 '25 25 26 26 26  '$ GLUCOSE 94 113* 139* 122* 106*  BUN 30* 26* '18 20 23  '$ CREATININE 1.51* 1.60* 1.34* 1.28* 1.28*  CALCIUM 8.8* 8.6* 8.5* 8.3* 8.2*  MG  --   --  2.2  --  2.4   Liver Function Tests: Recent Labs  Lab 06/23/22 0344  AST 26  ALT 15  ALKPHOS 45  BILITOT 0.7  PROT 5.4*  ALBUMIN 2.8*   CBC: Recent Labs  Lab 06/21/22 1620 06/22/22 0742 06/23/22 0344 06/24/22 0155 06/25/22 0211  WBC 7.8 5.9 8.2 6.7 5.9  HGB 12.0 10.9* 10.3* 9.5* 9.5*  HCT 37.3 33.6* 31.8* 30.4* 29.3*  MCV 88.6 89.1 88.8 90.7 89.3  PLT 207 164 160 162 176   CBG: No results for input(s): "GLUCAP" in the last 168 hours. Hgb A1c No results for input(s): "HGBA1C" in the last 72 hours. Lipid Profile No results for input(s): "CHOL", "HDL", "LDLCALC", "TRIG", "CHOLHDL", "LDLDIRECT" in the last 72 hours. Thyroid function studies No results for input(s): "TSH", "T4TOTAL", "T3FREE", "THYROIDAB" in the last 72 hours.  Invalid input(s): "FREET3" Urinalysis    Component Value Date/Time   COLORURINE YELLOW 01/19/2022 [place] 01/19/2022 1443   LABSPEC 1.008 01/19/2022 1443   PHURINE < OR = 5.0 01/19/2022 1443   GLUCOSEU NEGATIVE 01/19/2022 1443   HGBUR NEGATIVE 01/19/2022 1443   BILIRUBINUR negative 07/28/2020 1351   KETONESUR NEGATIVE 01/19/2022 1443    PROTEINUR NEGATIVE 01/19/2022 1443   UROBILINOGEN 0.2 07/28/2020 1351   UROBILINOGEN 0.2 06/13/2011 1230   NITRITE NEGATIVE 01/19/2022 1443   LEUKOCYTESUR TRACE (A) 01/19/2022 1443    FURTHER DISCHARGE INSTRUCTIONS:   Get Medicines reviewed and adjusted: Please take all your medications with you for your next visit with your Primary MD   Laboratory/radiological data: Please request your Primary MD to go over all hospital tests and procedure/radiological results at the follow up, please ask your Primary MD to get all Hospital records sent to his/her office.   In some cases, they will be blood work, cultures and biopsy results pending at the time of your discharge. Please request that your primary care M.D. goes through all the records of your hospital data and follows up on these results.   Also Note the following: If you experience worsening of your admission symptoms, develop shortness of breath, life threatening emergency, suicidal or homicidal thoughts you must seek medical attention immediately by calling 911 or calling your MD immediately  if symptoms less severe.   You must read complete instructions/literature along with all the possible adverse reactions/side effects for all the Medicines you take and that have been prescribed to you. Take any new Medicines after you have completely understood and accpet all the possible adverse reactions/side effects.    Do not drive when taking Pain medications or sleeping medications (Benzodaizepines)   Do not take more than prescribed Pain, Sleep and Anxiety Medications. It is not advisable to combine anxiety,sleep and pain medications without talking with your primary care practitioner   Special Instructions: If you have smoked or chewed Tobacco  in the last 2 yrs please stop smoking, stop any regular Alcohol  and or any Recreational drug use.   Wear Seat belts while driving.   Please note: You were  cared for by a hospitalist during your  hospital stay. Once you are discharged, your primary care physician will handle any further medical issues. Please note that NO REFILLS for any discharge medications will be authorized once you are discharged, as it is imperative that you return to your primary care physician (or establish a relationship with a primary care physician if you do not have one) for your post hospital discharge needs so that they can reassess your need for medications and monitor your lab values.  Time coordinating discharge: 25 minutes  SIGNED:  Geradine Girt 06/28/2022, 2:06 PM

## 2022-06-28 NOTE — TOC Transition Note (Signed)
Transition of Care [facility]) - CM/SW Discharge Note   Patient Details  Name: [name] MRN: [id number] Date of Birth: Apr 23, 1951  [facility] CM/SW Contact:  Joanne Chars, LCSW Phone Number: 06/28/2022, 1:35 PM   Clinical Narrative:   Pt discharging to [facility], room 505P.  RN call report to [phone].     Final next level of care: Skilled Nursing Facility Barriers to Discharge: Barriers Resolved   Patient Goals and CMS Choice Patient states their goals for this hospitalization and ongoing recovery are:: back to normal CMS Medicare.gov Compare Post Acute Care list provided to:: Patient Choice offered to / list presented to : Patient  Discharge Placement              [facility] at:  [facility] Patient to be transferred to facility by: [place] Name of family member notified: daughter [name] Patient and family notified of of transfer: 06/28/22  Discharge Plan and Services In-house Referral: Clinical Social Work   Post Acute Care Choice: [place]                               Social Determinants of Health (SDOH) Interventions     Readmission Risk Interventions     No data to display

## 2022-06-29 DIAGNOSIS — N1832 Chronic kidney disease, stage 3b: Secondary | ICD-10-CM | POA: Diagnosis not present

## 2022-06-29 DIAGNOSIS — I5032 Chronic diastolic (congestive) heart failure: Secondary | ICD-10-CM | POA: Diagnosis not present

## 2022-06-29 DIAGNOSIS — S72321D Displaced transverse fracture of shaft of right femur, subsequent encounter for closed fracture with routine healing: Secondary | ICD-10-CM | POA: Diagnosis not present

## 2022-06-29 DIAGNOSIS — G4733 Obstructive sleep apnea (adult) (pediatric): Secondary | ICD-10-CM | POA: Diagnosis not present

## 2022-07-03 ENCOUNTER — Encounter (HOSPITAL_COMMUNITY): Payer: Self-pay | Admitting: Orthopedic Surgery

## 2022-07-05 DIAGNOSIS — I10 Essential (primary) hypertension: Secondary | ICD-10-CM | POA: Diagnosis not present

## 2022-07-05 DIAGNOSIS — E785 Hyperlipidemia, unspecified: Secondary | ICD-10-CM | POA: Diagnosis not present

## 2022-07-05 DIAGNOSIS — I5032 Chronic diastolic (congestive) heart failure: Secondary | ICD-10-CM | POA: Diagnosis not present

## 2022-07-05 DIAGNOSIS — E119 Type 2 diabetes mellitus without complications: Secondary | ICD-10-CM | POA: Diagnosis not present

## 2022-07-08 ENCOUNTER — Encounter (HOSPITAL_COMMUNITY): Payer: Self-pay | Admitting: Orthopedic Surgery

## 2022-07-11 DIAGNOSIS — R350 Frequency of micturition: Secondary | ICD-10-CM | POA: Diagnosis not present

## 2022-07-11 DIAGNOSIS — R2242 Localized swelling, mass and lump, left lower limb: Secondary | ICD-10-CM | POA: Diagnosis not present

## 2022-07-11 DIAGNOSIS — M79661 Pain in right lower leg: Secondary | ICD-10-CM | POA: Diagnosis not present

## 2022-07-11 DIAGNOSIS — I509 Heart failure, unspecified: Secondary | ICD-10-CM | POA: Diagnosis not present

## 2022-07-12 DIAGNOSIS — S72451D Displaced supracondylar fracture without intracondylar extension of lower end of right femur, subsequent encounter for closed fracture with routine healing: Secondary | ICD-10-CM | POA: Diagnosis not present

## 2022-07-12 DIAGNOSIS — Z7689 Persons encountering health services in other specified circumstances: Secondary | ICD-10-CM | POA: Diagnosis not present

## 2022-07-12 DIAGNOSIS — R600 Localized edema: Secondary | ICD-10-CM | POA: Diagnosis not present

## 2022-07-13 NOTE — Progress Notes (Deleted)
Office Visit Note  Patient: [name]             Date of Birth: [birth year]           MRN: [id number]             PCP: Lorrene Reid, PA-C Referring: Lorrene Reid, PA-C Visit Date: 07/25/2022 Occupation: '@GUAROCC'$ @  Subjective:  No chief complaint on file.   History of Present Illness: [name] is a 71 y.o. female ***   Activities of Daily Living:  Patient reports morning stiffness for *** {minute/hour:19697}.   Patient {ACTIONS;DENIES/REPORTS:21021675::"Denies"} nocturnal pain.  Difficulty dressing/grooming: {ACTIONS;DENIES/REPORTS:21021675::"Denies"} Difficulty climbing stairs: {ACTIONS;DENIES/REPORTS:21021675::"Denies"} Difficulty getting out of chair: {ACTIONS;DENIES/REPORTS:21021675::"Denies"} Difficulty using hands for taps, buttons, cutlery, and/or writing: {ACTIONS;DENIES/REPORTS:21021675::"Denies"}  No Rheumatology ROS completed.   PMFS History:  Patient Active Problem List   Diagnosis Date Noted  . Femur fracture ([place]) 06/21/2022  . Chronic kidney disease, stage 3b ([place]) 06/21/2022  . DNR (do not resuscitate) 06/21/2022  . Agatston coronary artery calcium score greater than 400 05/25/2022  . Laryngopharyngeal reflux (LPR) 10/10/2021  . Chronic maxillary sinusitis 10/10/2021  . Other fatigue 02/14/2021  . Body mass index (BMI) of 36.0-36.9 in adult 02/14/2021  . Prediabetes 02/14/2021  . Abnormal kidney function 08/01/2020  . Muscle spasm of back 08/01/2020  . Microalbuminuria 08/01/2020  . Post-menopausal 09/02/2019  . Pain in left knee 08/23/2018  . Lumbar pain 07/04/2018  . Recurrent urticaria 05/20/2018  . Rhinitis 05/20/2018  . Vitamin D deficiency 11/20/2017  . Mixed hyperlipidemia 11/20/2017  . Family history of coronary arteriosclerosis- strong fam h/o CAD and early CAD.  07/17/2017  . Raynaud's disease without gangrene 03/12/2017  . Abnormal weight gain 02/06/2017  . Shingles outbreak 02/06/2017  . Cyst (solitary) of breast, left  01/31/2017  . Eosinophilic esophagitis 02/63/7858  . History of hyperlipidemia 10/04/2016  . 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 History of diverticulitis 09/03/2016  . Osteoporosis 09/03/2016  . Autoimmune disease ([place]) 09/02/2016  . High risk medication use 09/02/2016  . History of esophagitis 09/02/2016  . Elevated triglycerides with high cholesterol 08/23/2016  . Low serum HDL 08/23/2016  . Breast cyst, left 06/14/2016  . Encounter for wellness examination 05/23/2016  . Abnormality of gait 05/09/2016  . Sjoegren syndrome 02/29/2016  . GERD (gastroesophageal reflux disease) 01/19/2016  . Glucose intolerance (impaired glucose tolerance) 01/19/2016  . Chronic diastolic CHF (congestive heart failure) ([place]) 04/20/2015  . Peripheral polyneuropathy 02/02/2014  . Class 2 obesity due to excess calories with body mass index (BMI) of 36.0 to 36.9 in adult 11/17/2013  . Heart murmur   . OSA (obstructive sleep apnea)   . Rheumatoid arthritis ([place])   . Hiatal hernia   . Fibromyalgia   . PVC's (premature ventricular contractions)   . 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 Unilateral primary osteoarthritis, left knee 06/22/2011    Past Medical History:  Diagnosis Date  . Agatston coronary artery calcium score greater than 400 05/2022   coronary Ca score 856  . Back pain   . Blood transfusion    1981  . Chronic diastolic CHF (congestive heart failure) (HCC)    diastolic   . DDD (degenerative disc disease), cervical   . DDD (degenerative disc disease), lumbar   . Fibromyalgia   . GERD (gastroesophageal reflux disease)   . Heart murmur    as a child  . Hiatal hernia    sjorgens syndrome  . High blood pressure   .  High cholesterol   . IBS (irritable bowel syndrome)   . OSA (obstructive sleep apnea)   . Osteoarthritis   . Peripheral autonomic neuropathy of unknown cause   . Pre-diabetes   . PVC (premature ventricular contraction)   . Raynaud disease   .  Rheumatoid arthritis ([place])   . Sjogren's disease ([place])   . Urticaria   . Vitamin D deficiency     Family History  Problem Relation Age of Onset  . Alzheimer's disease Mother   . Heart attack Mother   . Hypertension Mother   . Glaucoma Mother   . High Cholesterol Mother   . Heart disease Mother   . Depression Mother   . Cancer Father   . High Cholesterol Father   . Heart disease Father   . Sleep apnea Father   . Breast cancer Maternal Aunt   . Heart attack Brother   . Heart disease Brother   . Glaucoma Brother   . Hyperlipidemia Brother   . Glaucoma Brother   . Asthma Son   . Allergic rhinitis Neg Hx   . Angioedema Neg Hx   . Eczema Neg Hx   . Urticaria Neg Hx   . Neuropathy Neg Hx    Past Surgical History:  Procedure Laterality Date  . ABDOMINAL HYSTERECTOMY     BTL, BSO  . ADENOIDECTOMY    . BREAST EXCISIONAL BIOPSY Left   . BREAST SURGERY     mass removal   . CHOLECYSTECTOMY    . dental implants    . DIAGNOSTIC LAPAROSCOPY     x3  . KNEE ARTHROSCOPY     x2  . MASS EXCISION Left 03/22/2017   Procedure: EXCISION OF LEFT BREAST MASS;  Surgeon: Donnie Mesa, MD;  Location: [facility];  Service: General;  Laterality: Left;  . ORIF FEMUR FRACTURE Right 06/22/2022   Procedure: RIGHT OPEN REDUCTION INTERNAL FIXATION (ORIF) DISTAL FEMUR FRACTURE;  Surgeon: Altamese Manass[place] Park, MD;  Location: [facility];  Service: Orthopedics;  Laterality: Right;  . patotid cystectomy    . RIGHT/LEFT HEART CATH AND CORONARY ANGIOGRAPHY N/A 07/25/2019   Procedure: RIGHT/LEFT HEART CATH AND CORONARY ANGIOGRAPHY;  Surgeon: Jolaine Artist, MD;  Location: [facility];  Service: Cardiovascular;  Laterality: N/A;  . TENDON REPAIR  1980   left ankle and tibia  . TONSILLECTOMY    . TOTAL KNEE ARTHROPLASTY  06/21/2011   Procedure: TOTAL KNEE ARTHROPLASTY;  Surgeon: Gearlean Alf;  Location: [facility];  Service: Orthopedics;  Laterality: Right;  . TUBAL LIGATION     Social History   Social  History Narrative   Lives at home alone   Right-handed   Drinks 1 or less cups of coffee and 2 servings of either tea or soda per day   Immunization History  Administered Date(s) Administered  . Fluad Quad(high Dose 65+) 05/05/2021  . Influenza, High Dose Seasonal PF 06/12/2018, 05/17/2019  . Influenza,inj,Quad PF,6+ Mos 05/23/2016, 06/01/2017  . Influenza-Unspecified 06/05/2018, 05/17/2019, 06/07/2020, 05/18/2022  . PFIZER(Purple Top)SARS-COV-2 Vaccination 09/03/2019, 09/24/2019, 04/01/2020, 10/04/2020  . Pension scheme manager 76yr & up 05/05/2021, 05/18/2022  . Pneumococcal Conjugate-13 07/02/2017  . Pneumococcal Polysaccharide-23 05/17/2005  . Respiratory Syncytial Virus Vaccine,Recomb Aduvanted(Arexvy) 05/18/2022  . Td 10/16/2007  . Tdap 03/21/2018  . Zoster Recombinat (Shingrix) 06/10/2019, 12/11/2019     Objective: Vital Signs: There were no vitals taken for this visit.   Physical Exam   Musculoskeletal Exam: ***  CDAI Exam: CDAI Score: --  Patient Global: --; Provider Global: -- Swollen: --; Tender: -- Joint Exam 07/25/2022   No joint exam has been documented for this visit   There is currently no information documented on the homunculus. Go to the Rheumatology activity and complete the homunculus joint exam.  Investigation: No additional findings.  Imaging: DG Ankle Complete Right  Result Date: 06/23/2022 CLINICAL DATA:  Fall EXAM: RIGHT ANKLE - COMPLETE 3 VIEW COMPARISON:  None Available. FINDINGS: Subtle lucency of the distal fibula which is concerning for nondisplaced fracture, best seen on oblique view. No evidence of dislocation. Calcaneal spur. Mild degenerative changes of the partially visualized midfoot. Soft tissue calcifications, likely phleboliths. Ankle soft tissue edema. IMPRESSION: Subtle lucency of the distal fibula which is concerning for nondisplaced fracture. Correlate for point tenderness. Electronically Signed   By: Yetta Glassman M.D.   On: 06/23/2022 12:09   DG CHEST PORT 1 VIEW  Result Date: 06/23/2022 CLINICAL DATA:  Hypoxemia status post recent ORIF distal femur fracture EXAM: PORTABLE CHEST 1 VIEW COMPARISON:  Chest radiograph dated 04/09/2020 FINDINGS: Asymmetrically decreased left lung volume. Increased hazy left lung opacity and patchy left retrocardiac opacities. No pleural effusion or pneumothorax. Similar enlarged cardiomediastinal silhouette. The visualized skeletal structures are unremarkable. IMPRESSION: Asymmetrically decreased left lung volume and increased hazy left lung opacity and patchy left retrocardiac opacities, likely atelectasis. Electronically Signed   By: Darrin Nipper M.D.   On: 06/23/2022 10:32   DG FEMUR PORT, MIN 2 VIEWS RIGHT  Result Date: 06/22/2022 CLINICAL DATA:  Fracture, postop. EXAM: PORTABLE RIGHT KNEE - 1-2 VIEW; RIGHT FEMUR PORTABLE 2 VIEW COMPARISON:  Preoperative imaging. FINDINGS: Lateral plate and multi screw fixation of distal femur fracture. The fracture is in improved alignment with mild persistent displacement. There is no new periprosthetic lucency. Right knee arthroplasty is in place. Alignment remains congruent. Recent postsurgical change includes air and edema in the joint space and soft tissues. IMPRESSION: ORIF distal femur fracture, in improved alignment. No immediate postoperative complication. Electronically Signed   By: Keith Rake M.D.   On: 06/22/2022 15:08   DG Knee Right Port  Result Date: 06/22/2022 CLINICAL DATA:  Fracture, postop. EXAM: PORTABLE RIGHT KNEE - 1-2 VIEW; RIGHT FEMUR PORTABLE 2 VIEW COMPARISON:  Preoperative imaging. FINDINGS: Lateral plate and multi screw fixation of distal femur fracture. The fracture is in improved alignment with mild persistent displacement. There is no new periprosthetic lucency. Right knee arthroplasty is in place. Alignment remains congruent. Recent postsurgical change includes air and edema in the joint space and  soft tissues. IMPRESSION: ORIF distal femur fracture, in improved alignment. No immediate postoperative complication. Electronically Signed   By: Keith Rake M.D.   On: 06/22/2022 15:08   DG FEMUR, MIN 2 VIEWS RIGHT  Result Date: 06/22/2022 CLINICAL DATA:  Elective surgery. EXAM: RIGHT FEMUR 2 VIEWS COMPARISON:  Preoperative imaging. FINDINGS: Six fluoroscopic spot views of the right femur obtained in the operating room. Lateral plate and multi screw fixation of distal femur fracture. Fluoroscopy time 40 seconds. Dose 3.95 mGy. IMPRESSION: Intraoperative fluoroscopy during distal femur fracture ORIF. Electronically Signed   By: Keith Rake M.D.   On: 06/22/2022 14:04   DG C-Arm 1-60 Min-No Report  Result Date: 06/22/2022 Fluoroscopy was utilized by the requesting physician.  No radiographic interpretation.   DG C-Arm 1-60 Min-No Report  Result Date: 06/22/2022 Fluoroscopy was utilized by the requesting physician.  No radiographic interpretation.   DG Cervical Spine With Flex & Extend  Result Date: 06/21/2022  CLINICAL DATA:  Rheumatoid arthritis. EXAM: CERVICAL SPINE COMPLETE WITH FLEXION AND EXTENSION VIEWS COMPARISON:  None Available. FINDINGS: Evaluation is very limited due to positioning and superimposition of the soft tissues of the cervical spine. No definite acute fracture or subluxation. The bones are osteopenic. The visualized odontoid and the posterior elements appear intact. There is anatomic alignment of the lateral masses of C1 and C2. The soft tissues are grossly unremarkable. IMPRESSION: 1. Very limited study. No definite acute fracture or subluxation. 2. Osteopenia. Electronically Signed   By: Anner Crete M.D.   On: 06/21/2022 18:26   DG Femur Min 2 Views Right  Result Date: 06/21/2022 CLINICAL DATA:  Fall EXAM: RIGHT FEMUR 2 VIEWS COMPARISON:  06/21/2022 FINDINGS: Right hip negative. Right knee replacement. Supracondylar fracture distal right femur with a 1/2 shaft  with of lateral displacement. Nearly 1 shaft with posterior displacement. No change from earlier today IMPRESSION: Displaced supracondylar fracture distal right femur. Right knee replacement. No other femur fracture. Electronically Signed   By: Franchot Gallo M.D.   On: 06/21/2022 15:01   DG Knee Complete 4 Views Right  Result Date: 06/21/2022 CLINICAL DATA:  Pt tripped and fell onto right side onto hardwood floor today - having pain around right knee area, hx of right knee replacement concern for dislocated TKA EXAM: RIGHT KNEE - COMPLETE 4+ VIEW COMPARISON:  None Available. FINDINGS: Oblique fracture through the distal RIGHT femoral metaphysis just above the distal femoral prosthetic. 2 cm lateral displacement the distal fracture fragment. On lateral projection there is posterior angulation of the distal fracture fragment and override approximately 1.7 cm IMPRESSION: Acute oblique fracture through the distal femoral metaphysis above the prosthetic. Electronically Signed   By: Suzy Bouchard M.D.   On: 06/21/2022 14:54    Recent Labs: Lab Results  Component Value Date   WBC 5.9 06/25/2022   HGB 9.5 (L) 06/25/2022   PLT 176 06/25/2022   NA 136 06/25/2022   K 4.2 06/25/2022   CL 103 06/25/2022   CO2 26 06/25/2022   GLUCOSE 106 (H) 06/25/2022   BUN 23 06/25/2022   CREATININE 1.28 (H) 06/25/2022   BILITOT 0.7 06/23/2022   ALKPHOS 45 06/23/2022   AST 26 06/23/2022   ALT 15 06/23/2022   PROT 5.4 (L) 06/23/2022   ALBUMIN 2.8 (L) 06/23/2022   CALCIUM 8.2 (L) 06/25/2022   GFRAA 58 (L) 09/23/2020    Speciality Comments: PLQ Eye Exam: 02/08/2021 OIB@ [facility] follow up in 1 year   Procedures:  No procedures performed Allergies: Sulfa antibiotics, Cymbalta [duloxetine hcl], Demerol, Ivp dye [iodinated contrast media], Morphine and related, Sulfasalazine, and Adhesive [tape]   Assessment / Plan:     Visit Diagnoses: No diagnosis found.  Orders: No orders of the defined types  were placed in this encounter.  No orders of the defined types were placed in this encounter.   Face-to-face time spent with patient was *** minutes. Greater than 50% of time was spent in counseling and coordination of care.  Follow-Up Instructions: No follow-ups on file.   Earnestine Mealing, CMA  Note - This record has been created using Editor, commissioning.  Chart creation errors have been sought, but may not always  have been located. Such creation errors do not reflect on  the standard of medical care.

## 2022-07-15 ENCOUNTER — Encounter: Payer: Self-pay | Admitting: Internal Medicine

## 2022-07-15 ENCOUNTER — Encounter: Payer: Self-pay | Admitting: Rheumatology

## 2022-07-18 NOTE — Progress Notes (Signed)
Virtual Visit via Video Note  I connected with [name] on 07/18/22 at  1:00 PM EST by a video enabled telemedicine application and verified that I am speaking with the correct person using two identifiers.  Location: Patient: Rehab facility Provider: Office   I discussed the limitations of evaluation and management by telemedicine and the availability of in person appointments. The patient expressed understanding and agreed to proceed.  Chief complaint: pain in right leg  History of Present Illness: [name] is 71 years old female with history of seropositive rheumatoid arthritis, osteoarthritis, degenerative disc disease and fibromyalgia syndrome.  She states she fell on June 21, 2022 and fractured her right femur.  She underwent surgery and now is in a rehab facility.  She is a still in a wheelchair and is gradually recovering.  She states she has been having some discomfort in her right knee joint which is replaced and appears swollen.  None of the other joints are painful or swollen.  She has been taking leflunomide and hydroxychloroquine on a regular basis without any interruption.  She continues to have some generalized pain and discomfort from osteoarthritis and fibromyalgia syndrome.  She also continues to have dry mouth and dry eyes for which she has been using over-the-counter products.  She states her hands are stiff in the morning.  She expects to be discharged prior to Christmas.  Review of Systems  Constitutional:  Positive for malaise/fatigue.  HENT:  Negative for congestion.   Eyes:  Negative for pain.  Respiratory:  Negative for shortness of breath.   Cardiovascular:  Positive for palpitations and leg swelling.  Gastrointestinal:  Positive for constipation.  Genitourinary:  Positive for urgency.  Musculoskeletal:  Positive for back pain, falls and joint pain.  Skin:  Negative for rash.  Neurological:  Positive for weakness.  Endo/Heme/Allergies:  Negative for  environmental allergies.  Psychiatric/Behavioral:  The patient has insomnia.      Physical Exam HENT:     Head: Normocephalic.  Eyes:     Conjunctiva/sclera: Conjunctivae normal.  Neurological:     Mental Status: She is alert and oriented to person, place, and time.  Psychiatric:        Mood and Affect: Mood normal.        Behavior: Behavior normal.        Thought Content: Thought content normal.       Patient reports morning stiffness for 15 minutes.   Patient reports nocturnal pain.  Difficulty dressing/grooming: Reports Difficulty climbing stairs: Reports Difficulty getting out of chair: Reports Difficulty using hands for taps, buttons, cutlery, and/or writing: Reports    Observations/Objective: Patient was oriented to time place and person.  Assessment and Plan: Visit Diagnoses: Rheumatoid arthritis involving multiple sites with positive rheumatoid factor (HCC) - Positive RF, positive ANA: She states she continues to have joint pain and discomfort.  She has been having increased pain and swelling in her right knee joint since she had her surgery.  None of the other joints are swollen.  She continues to have some discomfort due to underlying osteoarthritis and fibromyalgia.  She has been taking leflunomide 20 mg p.o. daily and hydroxychloroquine 200 mg p.o. twice daily Monday to Friday without any interruption.  She denies any side effects from the medications.   High risk medication use - Arava 20 mg daily and Plaquenil 200 mg 1 tablet by mouth twice daily Monday through Friday. D/c IV orencia due to cost.  PLQ Eye Exam: 02/08/2021 -  I reviewed her labs from June 25, 2022 CBC was normal except hemoglobin of 9.5.  Creatinine was elevated at 1.28.  LFTs were normal on June 23, 2022.  Lab findings were discussed with the patient.  She was advised to get labs in February and every 3 months to monitor for drug toxicity.  Information on immunization was also provided.  She was  advised to stop leflunomide if she develops an infection and resume after the infection resolves.  Plan: CBC with Differential/Platelet, COMPLETE METABOLIC PANEL WITH GFR   Sicca syndrome (HCC) - ANA-, Ro-, and La-: She states her sicca symptoms are worse with the medication use.  Over-the-counter products were discussed.   Raynaud's disease without gangrene-she has intermittent symptoms.  Warm clothing was discussed.   Chronic right shoulder pain -she is currently not having much discomfort she is in a wheelchair.  Status post femur fracture-patient states she fell on November 03/2022 and had right femur fracture.  She had surgery on June 22, 2022 by Dr. Marcelino Scot.  She is gradually recovering from the femur fracture.  She is a still in a wheelchair.   Unilateral primary osteoarthritis, left knee-she continues to have discomfort in her knee joints.   History of total knee replacement, right-she reports increased swelling in her right knee joint since her surgery.   Chronic SI joint pain-she continues to have chronic pain.   DDD (degenerative disc disease), lumbar-she is experiencing lower back pain intermittently.  Pain medications are helpful.   Fibromyalgia-she complains of generalized pain and discomfort which is difficult for her to differentiate from osteoarthritis.   Age-related osteoporosis without current pathological fracture - DEXA 08/27/2019: The BMD measured at Femur Total Right is 0.782 g/cm2 with a T-score of -1.8. s/p Forteo.  We will get repeat DEXA scan when she gets better.  Other medical problems are listed as follows:   History of hyperlipidemia   History of cardiac murmur   History of diabetes mellitus   CKD (chronic kidney disease) stage 4, GFR 15-29 ml/min (HCC)-she is followed by nephrologist.   History of peripheral neuropathy   History of diverticulitis   Eosinophilic esophagitis   History of depression     Follow Up Instructions:    I discussed  the assessment and treatment plan with the patient. The patient was provided an opportunity to ask questions and all were answered. The patient agreed with the plan and demonstrated an understanding of the instructions.   The patient was advised to call back or seek an in-person evaluation if the symptoms worsen or if the condition fails to improve as anticipated.  I provided 15 minutes of non-face-to-face time during this encounter.   Bo Merino, MD

## 2022-07-18 NOTE — Telephone Encounter (Signed)
We can do a virtual visit on December 12 at 1 PM.

## 2022-07-19 DIAGNOSIS — E119 Type 2 diabetes mellitus without complications: Secondary | ICD-10-CM | POA: Diagnosis not present

## 2022-07-19 DIAGNOSIS — I5032 Chronic diastolic (congestive) heart failure: Secondary | ICD-10-CM | POA: Diagnosis not present

## 2022-07-19 DIAGNOSIS — I10 Essential (primary) hypertension: Secondary | ICD-10-CM | POA: Diagnosis not present

## 2022-07-19 DIAGNOSIS — E785 Hyperlipidemia, unspecified: Secondary | ICD-10-CM | POA: Diagnosis not present

## 2022-07-20 ENCOUNTER — Telehealth: Payer: Self-pay

## 2022-07-20 NOTE — Telephone Encounter (Signed)
Pt in PT / Rehab for a broken Rt Femur and ankle.  Pt had appointment 08/01/22 at 1130 am, but will not be able to make this appointment with Dr. Lovena Le.   [place] sent message to reschedule the 12/19 appointment for a clinic day between 12/27 to 08/18/22 with Dr. Lovena Le.  Pt takes Amio, and is overdue for an EKG / provider assessment.  Waiting on new appointment.

## 2022-07-21 NOTE — Telephone Encounter (Signed)
Per Dr. Lovena Le, Message sent to scheduling to see patient mid-January.

## 2022-07-25 ENCOUNTER — Encounter: Payer: Self-pay | Admitting: Rheumatology

## 2022-07-25 ENCOUNTER — Ambulatory Visit: Payer: PPO | Admitting: Rheumatology

## 2022-07-25 ENCOUNTER — Ambulatory Visit: Payer: PPO | Attending: Rheumatology | Admitting: Rheumatology

## 2022-07-25 VITALS — Ht 62.0 in

## 2022-07-25 DIAGNOSIS — M1712 Unilateral primary osteoarthritis, left knee: Secondary | ICD-10-CM

## 2022-07-25 DIAGNOSIS — Z8669 Personal history of other diseases of the nervous system and sense organs: Secondary | ICD-10-CM

## 2022-07-25 DIAGNOSIS — M0579 Rheumatoid arthritis with rheumatoid factor of multiple sites without organ or systems involvement: Secondary | ICD-10-CM

## 2022-07-25 DIAGNOSIS — Z79899 Other long term (current) drug therapy: Secondary | ICD-10-CM

## 2022-07-25 DIAGNOSIS — Z96651 Presence of right artificial knee joint: Secondary | ICD-10-CM

## 2022-07-25 DIAGNOSIS — I7300 Raynaud's syndrome without gangrene: Secondary | ICD-10-CM

## 2022-07-25 DIAGNOSIS — Z8719 Personal history of other diseases of the digestive system: Secondary | ICD-10-CM

## 2022-07-25 DIAGNOSIS — M3500 Sicca syndrome, unspecified: Secondary | ICD-10-CM

## 2022-07-25 DIAGNOSIS — Z8781 Personal history of (healed) traumatic fracture: Secondary | ICD-10-CM

## 2022-07-25 DIAGNOSIS — N184 Chronic kidney disease, stage 4 (severe): Secondary | ICD-10-CM

## 2022-07-25 DIAGNOSIS — Z8639 Personal history of other endocrine, nutritional and metabolic disease: Secondary | ICD-10-CM

## 2022-07-25 DIAGNOSIS — Z8679 Personal history of other diseases of the circulatory system: Secondary | ICD-10-CM

## 2022-07-25 DIAGNOSIS — G8929 Other chronic pain: Secondary | ICD-10-CM

## 2022-07-25 DIAGNOSIS — K200 Eosinophilic esophagitis: Secondary | ICD-10-CM

## 2022-07-25 DIAGNOSIS — R600 Localized edema: Secondary | ICD-10-CM | POA: Diagnosis not present

## 2022-07-25 DIAGNOSIS — M810 Age-related osteoporosis without current pathological fracture: Secondary | ICD-10-CM

## 2022-07-25 DIAGNOSIS — M25511 Pain in right shoulder: Secondary | ICD-10-CM | POA: Diagnosis not present

## 2022-07-25 DIAGNOSIS — M797 Fibromyalgia: Secondary | ICD-10-CM

## 2022-07-25 DIAGNOSIS — Z8659 Personal history of other mental and behavioral disorders: Secondary | ICD-10-CM

## 2022-07-25 DIAGNOSIS — M533 Sacrococcygeal disorders, not elsewhere classified: Secondary | ICD-10-CM | POA: Diagnosis not present

## 2022-07-25 DIAGNOSIS — M5136 Other intervertebral disc degeneration, lumbar region: Secondary | ICD-10-CM

## 2022-07-25 DIAGNOSIS — M51369 Other intervertebral disc degeneration, lumbar region without mention of lumbar back pain or lower extremity pain: Secondary | ICD-10-CM

## 2022-07-25 NOTE — Patient Instructions (Signed)
Standing Labs We placed an order today for your standing lab work.   Please have your standing labs drawn in February  Please have your labs drawn 2 weeks prior to your appointment so that the provider can discuss your lab results at your appointment.  Please note that you may see your imaging and lab results in [place] before we have reviewed them. We will contact you once all results are reviewed. Please allow our office up to 72 hours to thoroughly review all of the results before contacting the office for clarification of your results.  Lab hours are:   Monday through Thursday from 8:00 am -12:30 pm and 1:00 pm-5:00 pm and Friday from 8:00 am-12:00 pm.  Please be advised, all patients with office appointments requiring lab work will take precedent over walk-in lab work.   Labs are drawn by [facility]. Please bring your co-pay at the time of your lab draw.  You may receive a bill from [facility] for your lab work.  Please note if you are on Hydroxychloroquine and and an order has been placed for a Hydroxychloroquine level, you will need to have it drawn 4 hours or more after your last dose.  If you wish to have your labs drawn at another location, please call the office 24 hours in advance so we can fax the orders.  The office is located at [address], [place], [place], [place] [address] No appointment is necessary.    If you have any questions regarding directions or hours of operation,  please call [phone].   As a reminder, please drink plenty of water prior to coming for your lab work. Thanks!   Vaccines You are taking a medication(s) that can suppress your immune system.  The following immunizations are recommended: Flu annually Covid-19  Td/Tdap (tetanus, diphtheria, pertussis) every 10 years Pneumonia (Prevnar 15 then Pneumovax 23 at least 1 year apart.  Alternatively, can take Prevnar 20 without needing additional dose) Shingrix: 2 doses from 4 weeks to 6 months  apart  Please check with your PCP to make sure you are up to date.   If you have signs or symptoms of an infection or start antibiotics: First, call your PCP for workup of your infection. Hold your medication through the infection, until you complete your antibiotics, and until symptoms resolve if you take the following: Injectable medication (Actemra, Benlysta, Cimzia, Cosentyx, Enbrel, Humira, Kevzara, Orencia, Remicade, Simponi, Stelara, Taltz, Tremfya) Methotrexate Leflunomide (Arava) Mycophenolate (Cellcept) Morrie Sheldon, Olumiant, or Rinvoq

## 2022-07-28 DIAGNOSIS — Z9181 History of falling: Secondary | ICD-10-CM | POA: Diagnosis not present

## 2022-07-28 DIAGNOSIS — R2689 Other abnormalities of gait and mobility: Secondary | ICD-10-CM | POA: Diagnosis not present

## 2022-07-28 DIAGNOSIS — M6281 Muscle weakness (generalized): Secondary | ICD-10-CM | POA: Diagnosis not present

## 2022-08-01 ENCOUNTER — Ambulatory Visit: Payer: PPO | Admitting: Internal Medicine

## 2022-08-01 DIAGNOSIS — N1832 Chronic kidney disease, stage 3b: Secondary | ICD-10-CM | POA: Diagnosis not present

## 2022-08-01 DIAGNOSIS — M545 Low back pain, unspecified: Secondary | ICD-10-CM | POA: Diagnosis not present

## 2022-08-01 DIAGNOSIS — Z7982 Long term (current) use of aspirin: Secondary | ICD-10-CM | POA: Diagnosis not present

## 2022-08-01 DIAGNOSIS — Z7985 Long-term (current) use of injectable non-insulin antidiabetic drugs: Secondary | ICD-10-CM | POA: Diagnosis not present

## 2022-08-01 DIAGNOSIS — Z96641 Presence of right artificial hip joint: Secondary | ICD-10-CM | POA: Diagnosis not present

## 2022-08-01 DIAGNOSIS — M069 Rheumatoid arthritis, unspecified: Secondary | ICD-10-CM | POA: Diagnosis not present

## 2022-08-01 DIAGNOSIS — M80051D Age-related osteoporosis with current pathological fracture, right femur, subsequent encounter for fracture with routine healing: Secondary | ICD-10-CM | POA: Diagnosis not present

## 2022-08-01 DIAGNOSIS — I7300 Raynaud's syndrome without gangrene: Secondary | ICD-10-CM | POA: Diagnosis not present

## 2022-08-01 DIAGNOSIS — L89612 Pressure ulcer of right heel, stage 2: Secondary | ICD-10-CM | POA: Diagnosis not present

## 2022-08-01 DIAGNOSIS — M3500 Sicca syndrome, unspecified: Secondary | ICD-10-CM | POA: Diagnosis not present

## 2022-08-01 DIAGNOSIS — I5032 Chronic diastolic (congestive) heart failure: Secondary | ICD-10-CM | POA: Diagnosis not present

## 2022-08-01 DIAGNOSIS — I130 Hypertensive heart and chronic kidney disease with heart failure and stage 1 through stage 4 chronic kidney disease, or unspecified chronic kidney disease: Secondary | ICD-10-CM | POA: Diagnosis not present

## 2022-08-01 DIAGNOSIS — E1122 Type 2 diabetes mellitus with diabetic chronic kidney disease: Secondary | ICD-10-CM | POA: Diagnosis not present

## 2022-08-01 DIAGNOSIS — Z9181 History of falling: Secondary | ICD-10-CM | POA: Diagnosis not present

## 2022-08-01 DIAGNOSIS — G4733 Obstructive sleep apnea (adult) (pediatric): Secondary | ICD-10-CM | POA: Diagnosis not present

## 2022-08-01 DIAGNOSIS — L89314 Pressure ulcer of right buttock, stage 4: Secondary | ICD-10-CM | POA: Diagnosis not present

## 2022-08-01 DIAGNOSIS — J329 Chronic sinusitis, unspecified: Secondary | ICD-10-CM | POA: Diagnosis not present

## 2022-08-01 DIAGNOSIS — M810 Age-related osteoporosis without current pathological fracture: Secondary | ICD-10-CM | POA: Diagnosis not present

## 2022-08-01 DIAGNOSIS — E1151 Type 2 diabetes mellitus with diabetic peripheral angiopathy without gangrene: Secondary | ICD-10-CM | POA: Diagnosis not present

## 2022-08-01 DIAGNOSIS — Z87310 Personal history of (healed) osteoporosis fracture: Secondary | ICD-10-CM | POA: Diagnosis not present

## 2022-08-01 DIAGNOSIS — M797 Fibromyalgia: Secondary | ICD-10-CM | POA: Diagnosis not present

## 2022-08-01 DIAGNOSIS — K219 Gastro-esophageal reflux disease without esophagitis: Secondary | ICD-10-CM | POA: Diagnosis not present

## 2022-08-01 DIAGNOSIS — M9701XD Periprosthetic fracture around internal prosthetic right hip joint, subsequent encounter: Secondary | ICD-10-CM | POA: Diagnosis not present

## 2022-08-01 DIAGNOSIS — G8929 Other chronic pain: Secondary | ICD-10-CM | POA: Diagnosis not present

## 2022-08-02 DIAGNOSIS — S72451D Displaced supracondylar fracture without intracondylar extension of lower end of right femur, subsequent encounter for closed fracture with routine healing: Secondary | ICD-10-CM | POA: Diagnosis not present

## 2022-08-03 ENCOUNTER — Other Ambulatory Visit: Payer: Self-pay | Admitting: Rheumatology

## 2022-08-03 NOTE — Telephone Encounter (Signed)
Next Visit: 11/28/2022  Last Visit: 07/25/2022  Last Fill: 05/03/2022  DX: Rheumatoid arthritis involving multiple sites with positive rheumatoid factor   Current Dose per office note 07/25/2022: Arava 20 mg daily   Labs: 06/25/2022 RBC 3.28, Hgb 9.5, Hct 29.3, glucose 106, Creat. 1.28 GFR 45,Calcium 8.2  Okay to refill Arava?

## 2022-08-10 ENCOUNTER — Encounter (INDEPENDENT_AMBULATORY_CARE_PROVIDER_SITE_OTHER): Payer: Self-pay | Admitting: Family Medicine

## 2022-08-12 DIAGNOSIS — M6281 Muscle weakness (generalized): Secondary | ICD-10-CM | POA: Diagnosis not present

## 2022-08-12 DIAGNOSIS — I5022 Chronic systolic (congestive) heart failure: Secondary | ICD-10-CM | POA: Diagnosis not present

## 2022-08-12 DIAGNOSIS — R2689 Other abnormalities of gait and mobility: Secondary | ICD-10-CM | POA: Diagnosis not present

## 2022-08-12 DIAGNOSIS — S72401D Unspecified fracture of lower end of right femur, subsequent encounter for closed fracture with routine healing: Secondary | ICD-10-CM | POA: Diagnosis not present

## 2022-08-14 NOTE — Progress Notes (Signed)
Established patient visit   Patient: [name]   DOB: 04-08-1951   72 y.o. Female  MRN: [id number] Visit Date: 08/15/2022   Chief Complaint  Patient presents with   Hospitalization Follow-up   Hip Injury    Right side    Subjective    HPI HPI     Hip Injury    Additional comments: Right side       Last edited by Gemma Payor, CMA on 08/15/2022  9:26 AM.      Follow up from recent hospital admission. -hospitalized from 06/21/2022 to 06/28/2022 -fell at home and fractured right femur.  -was moving clothes and placed things on her rolling walker seat.  The clothes fell off and got caught in the wheel of the walker, causing her to trip.  She landed directly on her R side.  -surgical repair done 06/22/2022 -was discharged to rehab -was released on 07/29/2022.  -she continues to have physical therapy coming to her home a few times per week.  -after that is completed, she will go into the office for PT if needed.  -currently using a walker to walk short distances. She is otherwise in wheelchair.  --she states that she is able to stand a bit better today than last week.  -she taking ibuprofen 800 ng up to three times daily. Was discharged with 200 mg ibuprfen tablets and advised to take 800 mg  -does need to get refill for her robaxin.  -was discharged with oxycodone for 30 days. She is trying to slowly wean herself down the dosing. States that she was discharged with the 30 days and was told to follow up with primary care provider.   -reporting bed sore on bottom --has been present for about a week.  --was tender, no longer very sore.  -she denies drainage. -states that It appears to be open but not draining.   Also congested cough.  --two days.    Medications: Outpatient Medications Prior to Visit  Medication Sig   aspirin EC 81 MG tablet Take 81 mg by mouth at bedtime.   cetirizine (ZYRTEC) 10 MG tablet Take 1 tablet (10 mg total) by mouth daily. (Patient taking  differently: Take 10 mg by mouth at bedtime.)   colestipol (COLESTID) 1 g tablet Take 2 g by mouth at bedtime.   cycloSPORINE (RESTASIS) 0.05 % ophthalmic emulsion Place 2 drops into both eyes 2 (two) times daily as needed (irritation).   diphenoxylate-atropine (LOMOTIL) 2.5-0.025 MG per tablet Take 1 tablet by mouth 4 (four) times daily as needed for diarrhea or loose stools.   doxylamine, Sleep, (UNISOM) 25 MG tablet Take 1 tablet (25 mg total) by mouth at bedtime as needed. (Patient taking differently: Take 25 mg by mouth at bedtime.)   fenofibrate (TRICOR) 48 MG tablet Take 1 tablet (48 mg total) by mouth daily. (Patient taking differently: Take 48 mg by mouth at bedtime.)   fluticasone (FLONASE) 50 MCG/ACT nasal spray Place 1 spray into both nostrils daily. (Patient taking differently: Place 1 spray into both nostrils as needed for allergies.)   gabapentin (NEURONTIN) 300 MG capsule TAKE 2 CAPSULES BY MOUTH 2 TIMES DAILY. (Patient taking differently: Take 600 mg by mouth 2 (two) times daily.)   hydroxychloroquine (PLAQUENIL) 200 MG tablet Take 1 tablet (200 mg total) by mouth daily. (Patient taking differently: Take 200 mg by mouth every Monday, Tuesday, Wednesday, Thursday, and Friday.)   hyoscyamine (ANASPAZ) 0.125 MG TBDP disintergrating tablet Place 0.25 mg under  the tongue every 6 (six) hours as needed for cramping.    leflunomide (ARAVA) 20 MG tablet TAKE 1 TABLET (20 MG TOTAL) BY MOUTH DAILY.   lidocaine (LIDODERM) 5 % PLACE 1 PATCH ONTO THE SKIN DAILY AS NEEDED FOR PAIN. REMOVE AND DISCARD PATCH WITHIN 12 HOURS OR AS DIRECTED BY MD. (Patient taking differently: Place 1 patch onto the skin 3 (three) times daily as needed (pain).)   metoprolol succinate (TOPROL-XL) 25 MG 24 hr tablet Take 0.5 tablets (12.5 mg total) by mouth daily.   Multiple Vitamin (MULTIVITAMIN WITH MINERALS) TABS tablet Take 1 tablet by mouth daily.   NONFORMULARY OR COMPOUNDED ITEM Triamcinolone 0.1% & Silvadene cream  1:1- Apply as directed to affected areas as needed   omeprazole (PRILOSEC) 40 MG capsule Take 40 mg by mouth as needed (nausea).   potassium chloride (KLOR-CON) 10 MEQ tablet Take 2 tablets (20 mEq total) by mouth daily.   Probiotic Product (PROBIOTIC DAILY PO) Take by mouth. Ultraflora IB Probiotic   promethazine (PHENERGAN) 25 MG tablet TAKE 1 TABLET BY MOUTH EVERY 6 HOURS AS NEEDED. (Patient taking differently: Take 25 mg by mouth every 6 (six) hours as needed for nausea or vomiting.)   rosuvastatin (CRESTOR) 10 MG tablet Take 1 tablet (10 mg total) by mouth daily.   Semaglutide, 1 MG/DOSE, 4 MG/3ML SOPN Inject 1 mg as directed once a week. (Patient taking differently: Inject 1 mg as directed every Sunday.)   torsemide 40 MG TABS Take 40 mg by mouth daily.   UNABLE TO FIND CPAP: AT bedtime; setting is "12"   [DISCONTINUED] amiodarone (PACERONE) 200 MG tablet Take 1 tablet (200 mg total) by mouth daily.   [DISCONTINUED] ibuprofen (ADVIL) 200 MG tablet Take 800 mg by mouth every 6 (six) hours as needed for mild pain.   [DISCONTINUED] methocarbamol (ROBAXIN) 500 MG tablet Take 1 tablet (500 mg total) by mouth every 6 (six) hours as needed for muscle spasms.   [DISCONTINUED] oxyCODONE (OXY IR/ROXICODONE) 5 MG immediate release tablet Take 1 tablet (5 mg total) by mouth every 4 (four) hours as needed for breakthrough pain ((for MODERATE breakthrough pain)).   [DISCONTINUED] spironolactone (ALDACTONE) 25 MG tablet Take 1 tablet (25 mg total) by mouth daily. (Patient taking differently: Take 12.5 mg by mouth daily.)   [DISCONTINUED] enoxaparin (LOVENOX) 40 MG/0.4ML injection Inject 0.4 mLs (40 mg total) into the skin daily.   No facility-administered medications prior to visit.    Review of Systems  Constitutional:  Positive for activity change and fatigue. Negative for appetite change, chills and fever.       Significant decrease in regular activity level since injury to right femur,.   HENT:   Negative for congestion, postnasal drip, rhinorrhea, sinus pressure, sinus pain, sneezing and sore throat.   Eyes: Negative.   Respiratory:  Negative for cough, chest tightness, shortness of breath and wheezing.   Cardiovascular:  Negative for chest pain and palpitations.  Gastrointestinal:  Negative for abdominal pain, constipation, diarrhea, nausea and vomiting.  Endocrine: Negative for cold intolerance, heat intolerance, polydipsia and polyuria.  Genitourinary:  Negative for dyspareunia, dysuria, flank pain, frequency and urgency.  Musculoskeletal:  Positive for arthralgias, joint swelling and myalgias. Negative for back pain.       Patient is primarily wheelchair bound.   Skin:  Negative for rash.       Skin sore on right buttock. Tender and red.   Allergic/Immunologic: Negative for environmental allergies.  Neurological:  Negative for dizziness,  weakness and headaches.  Hematological:  Negative for adenopathy.  Psychiatric/Behavioral:  Positive for dysphoric mood. The patient is not nervous/anxious.     Last CBC Lab Results  Component Value Date   WBC 5.9 06/25/2022   HGB 9.5 (L) 06/25/2022   HCT 29.3 (L) 06/25/2022   MCV 89.3 06/25/2022   MCH 29.0 06/25/2022   RDW 13.8 06/25/2022   PLT 176 24/26/8341   Last metabolic panel Lab Results  Component Value Date   GLUCOSE 106 (H) 06/25/2022   NA 136 06/25/2022   K 4.2 06/25/2022   CL 103 06/25/2022   CO2 26 06/25/2022   BUN 23 06/25/2022   CREATININE 1.28 (H) 06/25/2022   GFRNONAA 45 (L) 06/25/2022   CALCIUM 8.2 (L) 06/25/2022   PHOS 3.6 08/20/2018   PROT 5.4 (L) 06/23/2022   ALBUMIN 2.8 (L) 06/23/2022   LABGLOB 2.4 03/27/2022   AGRATIO 1.8 03/27/2022   BILITOT 0.7 06/23/2022   ALKPHOS 45 06/23/2022   AST 26 06/23/2022   ALT 15 06/23/2022   ANIONGAP 7 06/25/2022   Last lipids Lab Results  Component Value Date   CHOL 157 03/27/2022   HDL 68 03/27/2022   LDLCALC 64 03/27/2022   TRIG 145 03/27/2022   CHOLHDL 2.3  03/27/2022   Last hemoglobin A1c Lab Results  Component Value Date   HGBA1C 5.3 03/27/2022   Last thyroid functions Lab Results  Component Value Date   TSH 0.883 11/18/2021   T3TOTAL 141 12/01/2019   Last vitamin D Lab Results  Component Value Date   VD25OH 41.92 06/23/2022   Last vitamin B12 and Folate Lab Results  Component Value Date   VITAMINB12 491 07/02/2017       Objective     Today's Vitals   08/15/22 0926  BP: 129/84  Pulse: 66  Resp: 18  SpO2: 98%  PainSc: 4   PainLoc: Hip   There is no height or weight on file to calculate BMI.  BP Readings from Last 3 Encounters:  08/31/22 (Abnormal) 104/56  08/29/22 (Abnormal) 103/51  08/15/22 129/84    Wt Readings from Last 3 Encounters:  08/31/22 207 lb (93.9 kg)  06/23/22 226 lb 13.7 oz (102.9 kg)  05/25/22 195 lb (88.5 kg)    Physical Exam Vitals and nursing note reviewed.  Constitutional:      Appearance: Normal appearance. She is well-developed.  HENT:     Head: Normocephalic and atraumatic.  Eyes:     Pupils: Pupils are equal, round, and reactive to light.  Cardiovascular:     Rate and Rhythm: Normal rate and regular rhythm.     Pulses: Normal pulses.     Heart sounds: Normal heart sounds.  Pulmonary:     Effort: Pulmonary effort is normal.     Breath sounds: Normal breath sounds.  Abdominal:     Palpations: Abdomen is soft.  Musculoskeletal:        General: Normal range of motion.     Cervical back: Normal range of motion and neck supple.     Comments: Activity level decreased. Currently in wheelchair. Uses manual wheelchair. Doing well propelling chair with arms and "good leg." Moderate pain in right upper leg and hip.   Lymphadenopathy:     Cervical: No cervical adenopathy.  Skin:    General: Skin is warm and dry.     Capillary Refill: Capillary refill takes less than 2 seconds.     Comments: Mild skin break down of right lower buttock. Area  red. Tender to palpate. Slightly warm. No  drainage noted.   Neurological:     General: No focal deficit present.     Mental Status: She is alert and oriented to person, place, and time.  Psychiatric:        Mood and Affect: Mood normal.        Behavior: Behavior normal.        Thought Content: Thought content normal.        Judgment: Judgment normal.      Assessment & Plan    1. Pressure sore of left ischial area, stage II (HCC) Pressure injury developing on right buttock. Refer to wound care/management through home health suervices.  - Ambulatory referral to Home Health  2. Aftercare for healing traumatic fracture of right femur Patient recently discharged to home after long stay in rehab facility after fracturing her right femur. Was not given adequate pain medication to control pain. Discussed parameters for prescribing pain medications in primary care provider. She voiced understanding and agreement. Single prescription for oxycodone 5 mg tablets which may be taken up to  every 6 hours as needed for moderate to severe pain. May take ibuprofen 800 mg three times daily as needed for mild to moderate pain. She may use robaxin 500 mg four times daily as needed for muscle spasms. Advised she follow up with orthopedic surgeon for more sufficient pain management.  - Ambulatory referral to [place] - ibuprofen (ADVIL) 800 MG tablet; Take 1 tablet (800 mg total) by mouth every 6 (six) hours as needed for mild pain.  Dispense: 90 tablet; Refill: 1 - methocarbamol (ROBAXIN) 500 MG tablet; Take 1 tablet (500 mg total) by mouth every 6 (six) hours as needed for muscle spasms.  Dispense: 120 tablet; Refill: 1 - oxyCODONE (OXY IR/ROXICODONE) 5 MG immediate release tablet; Take 1 tablet (5 mg total) by mouth every 6 (six) hours as needed for up to 14 days for breakthrough pain ((for MODERATE breakthrough pain)).  Dispense: 56 tablet; Refill: 0  3. Pain in right leg Discussed parameters for prescribing pain medications in primary care  provider. She voiced understanding and agreement. Single prescription for oxycodone 5 mg tablets which may be taken up to  every 6 hours as needed for moderate to severe pain. May take ibuprofen 800 mg three times daily as needed for mild to moderate pain. She may use robaxin 500 mg four times daily as needed for muscle spasms. Advised she follow up with orthopedic surgeon for more sufficient pain management.  - ibuprofen (ADVIL) 800 MG tablet; Take 1 tablet (800 mg total) by mouth every 6 (six) hours as needed for mild pain.  Dispense: 90 tablet; Refill: 1 - methocarbamol (ROBAXIN) 500 MG tablet; Take 1 tablet (500 mg total) by mouth every 6 (six) hours as needed for muscle spasms.  Dispense: 120 tablet; Refill: 1 - oxyCODONE (OXY IR/ROXICODONE) 5 MG immediate release tablet; Take 1 tablet (5 mg total) by mouth every 6 (six) hours as needed for up to 14 days for breakthrough pain ((for MODERATE breakthrough pain)).  Dispense: 56 tablet; Refill: 0    Problem List Items Addressed This Visit       Musculoskeletal and Integument   Aftercare for healing traumatic fracture of right femur   Relevant Medications   ibuprofen (ADVIL) 800 MG tablet   methocarbamol (ROBAXIN) 500 MG tablet   Other Relevant Orders   Ambulatory referral to [name]     Other   Pressure  sore of left ischial area, stage II ([place]) - Primary   Relevant Orders   Ambulatory referral to [facility]   Pain in right leg   Relevant Medications   ibuprofen (ADVIL) 800 MG tablet   methocarbamol (ROBAXIN) 500 MG tablet     Return in about 3 weeks (around 09/05/2022) for post operative right femur fracture .         Ronnell Freshwater, NP  [facility] at [facility] [phone] (phone) [phone] (fax)  [place]

## 2022-08-15 ENCOUNTER — Ambulatory Visit (INDEPENDENT_AMBULATORY_CARE_PROVIDER_SITE_OTHER): Payer: PPO | Admitting: Nurse Practitioner

## 2022-08-15 ENCOUNTER — Encounter: Payer: Self-pay | Admitting: Nurse Practitioner

## 2022-08-15 VITALS — BP 129/84 | HR 66 | Resp 18

## 2022-08-15 DIAGNOSIS — S7291XD Unspecified fracture of right femur, subsequent encounter for closed fracture with routine healing: Secondary | ICD-10-CM | POA: Diagnosis not present

## 2022-08-15 DIAGNOSIS — M79604 Pain in right leg: Secondary | ICD-10-CM | POA: Diagnosis not present

## 2022-08-15 DIAGNOSIS — L89322 Pressure ulcer of left buttock, stage 2: Secondary | ICD-10-CM | POA: Diagnosis not present

## 2022-08-15 MED ORDER — METHOCARBAMOL 500 MG PO TABS: 500.0000 mg | ORAL_TABLET | Freq: Four times a day (QID) | ORAL | 1 refills | Status: DC | PRN

## 2022-08-15 MED ORDER — DOXYCYCLINE HYCLATE 100 MG PO TABS: 100.0000 mg | ORAL_TABLET | Freq: Two times a day (BID) | ORAL | 0 refills | Status: DC

## 2022-08-15 MED ORDER — OXYCODONE HCL 5 MG PO TABS: 5.0000 mg | ORAL_TABLET | Freq: Four times a day (QID) | ORAL | 0 refills | Status: DC | PRN

## 2022-08-15 MED ORDER — IBUPROFEN 800 MG PO TABS: 800.0000 mg | ORAL_TABLET | Freq: Four times a day (QID) | ORAL | 1 refills | Status: DC | PRN

## 2022-08-16 ENCOUNTER — Telehealth: Payer: Self-pay

## 2022-08-16 NOTE — Telephone Encounter (Signed)
Maggie called to get VO for Pt to have a Nursing Eval for Rt buttock pressure ulcer-unable to stage the area per PT. Also wanting to know how many days she will be taking the doxycycline (VIBRA-TABS) 100 MG tablet .  Per secure chat Nira Conn approved the VO. I returned call and gave her the VO for Nursing and let her know she was taking the medication for 14 days.  FYI

## 2022-08-17 ENCOUNTER — Telehealth: Payer: Self-pay

## 2022-08-17 NOTE — Telephone Encounter (Signed)
[name] is calling for VO to start nursing. Frequency is  1 time a week for 1 week 2 times a week for 2 weeks 1 time a week for 5 weeks  I advised her that was approved

## 2022-08-21 ENCOUNTER — Ambulatory Visit (INDEPENDENT_AMBULATORY_CARE_PROVIDER_SITE_OTHER): Payer: PPO | Admitting: Family Medicine

## 2022-08-23 DIAGNOSIS — M069 Rheumatoid arthritis, unspecified: Secondary | ICD-10-CM | POA: Diagnosis not present

## 2022-08-23 DIAGNOSIS — M3500 Sicca syndrome, unspecified: Secondary | ICD-10-CM | POA: Diagnosis not present

## 2022-08-23 DIAGNOSIS — G8929 Other chronic pain: Secondary | ICD-10-CM | POA: Diagnosis not present

## 2022-08-23 DIAGNOSIS — J329 Chronic sinusitis, unspecified: Secondary | ICD-10-CM | POA: Diagnosis not present

## 2022-08-23 DIAGNOSIS — N1832 Chronic kidney disease, stage 3b: Secondary | ICD-10-CM | POA: Diagnosis not present

## 2022-08-23 DIAGNOSIS — K219 Gastro-esophageal reflux disease without esophagitis: Secondary | ICD-10-CM | POA: Diagnosis not present

## 2022-08-23 DIAGNOSIS — E1122 Type 2 diabetes mellitus with diabetic chronic kidney disease: Secondary | ICD-10-CM | POA: Diagnosis not present

## 2022-08-23 DIAGNOSIS — M80051D Age-related osteoporosis with current pathological fracture, right femur, subsequent encounter for fracture with routine healing: Secondary | ICD-10-CM | POA: Diagnosis not present

## 2022-08-23 DIAGNOSIS — I5032 Chronic diastolic (congestive) heart failure: Secondary | ICD-10-CM | POA: Diagnosis not present

## 2022-08-23 DIAGNOSIS — G4733 Obstructive sleep apnea (adult) (pediatric): Secondary | ICD-10-CM | POA: Diagnosis not present

## 2022-08-23 DIAGNOSIS — M9701XD Periprosthetic fracture around internal prosthetic right hip joint, subsequent encounter: Secondary | ICD-10-CM | POA: Diagnosis not present

## 2022-08-23 DIAGNOSIS — M545 Low back pain, unspecified: Secondary | ICD-10-CM | POA: Diagnosis not present

## 2022-08-23 DIAGNOSIS — M797 Fibromyalgia: Secondary | ICD-10-CM | POA: Diagnosis not present

## 2022-08-23 DIAGNOSIS — I130 Hypertensive heart and chronic kidney disease with heart failure and stage 1 through stage 4 chronic kidney disease, or unspecified chronic kidney disease: Secondary | ICD-10-CM | POA: Diagnosis not present

## 2022-08-23 DIAGNOSIS — Z7982 Long term (current) use of aspirin: Secondary | ICD-10-CM | POA: Diagnosis not present

## 2022-08-23 DIAGNOSIS — Z9181 History of falling: Secondary | ICD-10-CM | POA: Diagnosis not present

## 2022-08-27 DIAGNOSIS — G4733 Obstructive sleep apnea (adult) (pediatric): Secondary | ICD-10-CM | POA: Diagnosis not present

## 2022-08-27 DIAGNOSIS — R269 Unspecified abnormalities of gait and mobility: Secondary | ICD-10-CM | POA: Diagnosis not present

## 2022-08-28 ENCOUNTER — Ambulatory Visit: Payer: PPO | Admitting: Adult Health

## 2022-08-29 ENCOUNTER — Encounter: Payer: Self-pay | Admitting: Adult Health

## 2022-08-29 ENCOUNTER — Ambulatory Visit: Payer: PPO | Admitting: Adult Health

## 2022-08-29 VITALS — BP 103/51 | HR 72 | Ht 62.0 in

## 2022-08-29 DIAGNOSIS — R269 Unspecified abnormalities of gait and mobility: Secondary | ICD-10-CM

## 2022-08-29 DIAGNOSIS — G629 Polyneuropathy, unspecified: Secondary | ICD-10-CM | POA: Diagnosis not present

## 2022-08-29 NOTE — Progress Notes (Signed)
PATIENT: [name] DOB: 07/26/1951  REASON FOR VISIT: follow up HISTORY FROM: patient  Chief Complaint  Patient presents with   Follow-up    Pt in 19  Pt here for Neuropathy f/u Pt states neuropathy is better Pt states had covid 3 weeks ago       HISTORY OF PRESENT ILLNESS: Today 08/29/22:  [name] is a 72 y.o. female with a history of peripheral neuropathy. Returns today for follow-up.  She reports that overall she has been doing well.  She feels that her neuropathy is better.  Remains on gabapentin 600 mg twice a day.  Fell in November and broke her leg (femur). Surgery 11/9. Currently using a wheelchair but can ambulate short distances.  Returns today for evaluation.  08/25/21 [name] is a 72 year old female with a history of Peripheral Neuropathy. She returns today for follow-up. Continues on gabapentin 600 mg twice a day.  She reports that this controls her symptoms.  Occasionally she does have breakthrough symptoms.  She also uses lidocaine patches which she finds beneficial.  She does note that her balance has worsening since the last visit.  Her last fall was in the spring.  She does have a cane that she uses intermittently.  She returns today for an evaluation.  08/25/20: [name] is a 72 year old female with a history of peripheral neuropathy.  She remains on gabapentin 600 mg twice a day.  She states that she continues to have some discomfort in the lower extremities but she feels it is manageable.  She would like to continue to wean off of gabapentin but does not feel she will be able to tolerate the discomfort if she chooses to do so.  The patient denies any significant changes with her gait or balance.  Denies any recent falls.  She does have a cane or walker but only uses it if she feels that she needs it.  She returns today for follow-up.  6/29/21[name] is a 72 year old female with a history of peripheral neuropathy.  She returns today for follow-up.   Reports that she was able to wean herself off of carbamazepine.  She remains on gabapentin 600 mg in the morning and 900 mg at bedtime.  She states that this controls her discomfort.  Reports that she does have numbness and tingling in the lower extremities.  Denies any significant changes with her gait or balance.  No falls.  She returns today for an evaluation.  HISTORY 09/02/18 :   [name] is a 72 year old female with a history of rheumatoid arthritis and peripheral neuropathy.  She returns today for follow-up.  She is currently taking carbamazepine and gabapentin.  She reports that this is managing her neuropathy pain well.  She states that she still has some numbness and tingling throughout the day but it is manageable.  She denies any significant changes with her gait or balance.  She did have a fall back in November while in [place].  She states that she did break some vertebrae but has seen orthopedist and has since been released.  She states that she will use a cane and walker only for long distances.  She states that she was given lidocaine patches by her rheumatologist for RA.  She reports that she is used those on her feet and legs and it is beneficial for her neuropathy.  She would like [place] to try to refill this to see if it will be approved  by her insurance.  Patient also states that gabapentin seems to have decreased her libido.  She expressed that she would like to wean off this medication as long as her pain is under control.  She returns today for evaluation.  REVIEW OF SYSTEMS: Out of a complete 14 system review of symptoms, the patient complains only of the following symptoms, and all other reviewed systems are negative.  See HPI  ALLERGIES: Allergies  Allergen Reactions   Sulfa Antibiotics Hives and Itching    Flushing   Cymbalta [Duloxetine Hcl] Other (See Comments)    Manic reaction   Demerol Hives and Other (See Comments)    Fever    Ivp Dye [Iodinated Contrast Media]  Hives and Other (See Comments)    Fever   Morphine And Related Other (See Comments)    ineffective   Sulfasalazine Hives and Other (See Comments)    Flushing   Adhesive [Tape] Rash    HOME MEDICATIONS: Outpatient Medications Prior to Visit  Medication Sig Dispense Refill   amiodarone (PACERONE) 200 MG tablet Take 1 tablet (200 mg total) by mouth daily. 90 tablet 3   aspirin EC 81 MG tablet Take 81 mg by mouth at bedtime.     cetirizine (ZYRTEC) 10 MG tablet Take 1 tablet (10 mg total) by mouth daily. (Patient taking differently: Take 10 mg by mouth at bedtime.) 90 tablet 0   colestipol (COLESTID) 1 g tablet Take 2 g by mouth at bedtime.     cycloSPORINE (RESTASIS) 0.05 % ophthalmic emulsion Place 2 drops into both eyes 2 (two) times daily as needed (irritation).     diphenoxylate-atropine (LOMOTIL) 2.5-0.025 MG per tablet Take 1 tablet by mouth 4 (four) times daily as needed for diarrhea or loose stools.     doxylamine, Sleep, (UNISOM) 25 MG tablet Take 1 tablet (25 mg total) by mouth at bedtime as needed. (Patient taking differently: Take 25 mg by mouth at bedtime.) 30 tablet 0   fenofibrate (TRICOR) 48 MG tablet Take 1 tablet (48 mg total) by mouth daily. (Patient taking differently: Take 48 mg by mouth at bedtime.) 90 tablet 0   fluticasone (FLONASE) 50 MCG/ACT nasal spray Place 1 spray into both nostrils daily. (Patient taking differently: Place 1 spray into both nostrils as needed for allergies.) 16 g 0   gabapentin (NEURONTIN) 300 MG capsule TAKE 2 CAPSULES BY MOUTH 2 TIMES DAILY. (Patient taking differently: Take 600 mg by mouth 2 (two) times daily.) 360 capsule 3   hydroxychloroquine (PLAQUENIL) 200 MG tablet Take 1 tablet (200 mg total) by mouth daily. (Patient taking differently: Take 200 mg by mouth every Monday, Tuesday, Wednesday, Thursday, and Friday.) 90 tablet 0   hyoscyamine (ANASPAZ) 0.125 MG TBDP disintergrating tablet Place 0.25 mg under the tongue every 6 (six) hours as  needed for cramping.      ibuprofen (ADVIL) 800 MG tablet Take 1 tablet (800 mg total) by mouth every 6 (six) hours as needed for mild pain. 90 tablet 1   leflunomide (ARAVA) 20 MG tablet TAKE 1 TABLET (20 MG TOTAL) BY MOUTH DAILY. 90 tablet 0   lidocaine (LIDODERM) 5 % PLACE 1 PATCH ONTO THE SKIN DAILY AS NEEDED FOR PAIN. REMOVE AND DISCARD PATCH WITHIN 12 HOURS OR AS DIRECTED BY MD. (Patient taking differently: Place 1 patch onto the skin 3 (three) times daily as needed (pain).) 90 patch 0   methocarbamol (ROBAXIN) 500 MG tablet Take 1 tablet (500 mg total) by mouth every 6 (  six) hours as needed for muscle spasms. 120 tablet 1   metoprolol succinate (TOPROL-XL) 25 MG 24 hr tablet Take 0.5 tablets (12.5 mg total) by mouth daily. 45 tablet 3   Multiple Vitamin (MULTIVITAMIN WITH MINERALS) TABS tablet Take 1 tablet by mouth daily.     NONFORMULARY OR COMPOUNDED ITEM Triamcinolone 0.1% & Silvadene cream 1:1- Apply as directed to affected areas as needed     omeprazole (PRILOSEC) 40 MG capsule Take 40 mg by mouth as needed (nausea).     oxyCODONE (OXY IR/ROXICODONE) 5 MG immediate release tablet Take 1 tablet (5 mg total) by mouth every 6 (six) hours as needed for up to 14 days for breakthrough pain ((for MODERATE breakthrough pain)). 56 tablet 0   potassium chloride (KLOR-CON) 10 MEQ tablet Take 2 tablets (20 mEq total) by mouth daily. 180 tablet 3   Probiotic Product (PROBIOTIC DAILY PO) Take by mouth. Ultraflora IB Probiotic     promethazine (PHENERGAN) 25 MG tablet TAKE 1 TABLET BY MOUTH EVERY 6 HOURS AS NEEDED. (Patient taking differently: Take 25 mg by mouth every 6 (six) hours as needed for nausea or vomiting.) 90 tablet 0   rosuvastatin (CRESTOR) 10 MG tablet Take 1 tablet (10 mg total) by mouth daily. 90 tablet 3   Semaglutide, 1 MG/DOSE, 4 MG/3ML SOPN Inject 1 mg as directed once a week. (Patient taking differently: Inject 1 mg as directed every Sunday.) 9 mL 0   spironolactone (ALDACTONE) 25  MG tablet Take 1 tablet (25 mg total) by mouth daily. (Patient taking differently: Take 12.5 mg by mouth daily.) 180 tablet 3   torsemide 40 MG TABS Take 40 mg by mouth daily. 30 tablet 0   UNABLE TO FIND CPAP: AT bedtime; setting is "12"     doxycycline (VIBRA-TABS) 100 MG tablet Take 1 tablet (100 mg total) by mouth 2 (two) times daily. 28 tablet 0   enoxaparin (LOVENOX) 40 MG/0.4ML injection Inject 0.4 mLs (40 mg total) into the skin daily. 0 mL    No facility-administered medications prior to visit.    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/2022   coronary Ca score 856   Back pain    Blood transfusion    1981   Chronic diastolic CHF (congestive heart failure) (HCC)    diastolic    COVID    DDD (degenerative disc disease), cervical    DDD (degenerative disc disease), lumbar    Fibromyalgia    GERD (gastroesophageal reflux disease)    Heart murmur    as a child   Hiatal hernia    sjorgens syndrome   High blood pressure    High cholesterol    IBS (irritable bowel syndrome)    OSA (obstructive sleep apnea)    Osteoarthritis    Peripheral autonomic neuropathy of unknown cause    Pre-diabetes    PVC (premature ventricular contraction)    Raynaud disease    Rheumatoid arthritis (HCC)    Sjogren's disease (HCC)    Urticaria    Vitamin D deficiency     PAST SURGICAL HISTORY: Past Surgical History:  Procedure Laterality Date   ABDOMINAL HYSTERECTOMY     BTL, BSO   ADENOIDECTOMY     BREAST EXCISIONAL BIOPSY Left    BREAST SURGERY     mass removal    CHOLECYSTECTOMY     dental implants     DIAGNOSTIC LAPAROSCOPY  x3   FEMUR FRACTURE SURGERY Right    KNEE ARTHROSCOPY     x2   MASS EXCISION Left 03/22/2017   Procedure: EXCISION OF LEFT BREAST MASS;  Surgeon: Donnie Mesa, MD;  Location: [facility];  Service: General;  Laterality: Left;   ORIF FEMUR FRACTURE Right 06/22/2022    Procedure: RIGHT OPEN REDUCTION INTERNAL FIXATION (ORIF) DISTAL FEMUR FRACTURE;  Surgeon: Altamese Center, MD;  Location: [facility];  Service: Orthopedics;  Laterality: Right;   patotid cystectomy     RIGHT/LEFT HEART CATH AND CORONARY ANGIOGRAPHY N/A 07/25/2019   Procedure: RIGHT/LEFT HEART CATH AND CORONARY ANGIOGRAPHY;  Surgeon: Jolaine Artist, MD;  Location: [facility];  Service: Cardiovascular;  Laterality: N/A;   TENDON REPAIR  1980   left ankle and tibia   TONSILLECTOMY     TOTAL KNEE ARTHROPLASTY  06/21/2011   Procedure: TOTAL KNEE ARTHROPLASTY;  Surgeon: Gearlean Alf;  Location: [facility];  Service: Orthopedics;  Laterality: Right;   TUBAL LIGATION      FAMILY HISTORY: Family History  Problem Relation Age of Onset   Alzheimer's disease Mother    Heart attack Mother    Hypertension Mother    Glaucoma Mother    High Cholesterol Mother    Heart disease Mother    Depression Mother    Cancer Father    High Cholesterol Father    Heart disease Father    Sleep apnea Father    Breast cancer Maternal Aunt    Heart attack Brother    Heart disease Brother    Glaucoma Brother    Hyperlipidemia Brother    Glaucoma Brother    Asthma Son    Allergic rhinitis Neg Hx    Angioedema Neg Hx    Eczema Neg Hx    Urticaria Neg Hx    Neuropathy Neg Hx     SOCIAL HISTORY: Social History   Socioeconomic History   Marital status: Widowed    Spouse name: Not on file   Number of children: 2   Years of education: AS   Highest education level: Not on file  Occupational History   Occupation: retired Marine scientist   Occupation: RN  Tobacco Use   Smoking status: Never    Passive exposure: Past   Smokeless tobacco: Never  Vaping Use   Vaping Use: Never used  Substance and Sexual Activity   Alcohol use: Yes    Comment: socially    Drug use: No   Sexual activity: Never  Other Topics Concern   Not on file  Social History Narrative   Lives at home alone   Right-handed   Drinks 1  or less cups of coffee and 2 servings of either tea or soda per day   Social Determinants of Health   Financial Resource Strain: Not on file  Food Insecurity: No Food Insecurity (06/22/2022)   Hunger Vital Sign    Worried About Running Out of Food in the Last Year: Never true    Ran Out of Food in the Last Year: Never true  Transportation Needs: No Transportation Needs (06/22/2022)   PRAPARE - Hydrologist (Medical): No    Lack of Transportation (Non-Medical): No  Physical Activity: Not on file  Stress: Not on file  Social Connections: Not on file  Intimate Partner Violence: Not At Risk (06/22/2022)   Humiliation, Afraid, Rape, and Kick questionnaire    Fear of Current or Ex-Partner: No    Emotionally  Abused: No    Physically Abused: No    Sexually Abused: No      PHYSICAL EXAM  Vitals:   08/29/22 1054  BP: (!) 103/51  Pulse: 72  Height: '5\' 2"'$  (1.575 m)    Body mass index is 41.49 kg/m.  Generalized: Well developed, in no acute distress   Neurological examination  Mentation: Alert oriented to time, place, history taking. Follows all commands speech and language fluent Cranial nerve II-XII: Pupils were equal round reactive to light. Extraocular movements were full, visual field were full on confrontational test. Facial sensation and strength were normal. Uvula tongue midline. Head turning and shoulder shrug  were normal and symmetric. Motor: The motor testing reveals 5 over 5 strength of all 4 extremities. Good symmetric motor tone is noted throughout.  Sensory: Sensory testing is intact to soft touch on all 4 extremities. No evidence of extinction is noted.  Coordination: Cerebellar testing reveals good finger-nose-finger and heel-to-shin bilaterally.  Gait and station: in a wheelchair Reflexes: Deep tendon reflexes are symmetric and normal bilaterally.   DIAGNOSTIC DATA (LABS, IMAGING, TESTING) - I reviewed patient records, labs, notes,  testing and imaging myself where available.  Lab Results  Component Value Date   WBC 5.9 06/25/2022   HGB 9.5 (L) 06/25/2022   HCT 29.3 (L) 06/25/2022   MCV 89.3 06/25/2022   PLT 176 06/25/2022      Component Value Date/Time   NA 136 06/25/2022 0211   NA 140 03/27/2022 1417   K 4.2 06/25/2022 0211   CL 103 06/25/2022 0211   CO2 26 06/25/2022 0211   GLUCOSE 106 (H) 06/25/2022 0211   BUN 23 06/25/2022 0211   BUN 35 (H) 03/27/2022 1417   CREATININE 1.28 (H) 06/25/2022 0211   CREATININE 1.96 (H) 01/19/2022 1443   CALCIUM 8.2 (L) 06/25/2022 0211   PROT 5.4 (L) 06/23/2022 0344   PROT 6.7 03/27/2022 1417   ALBUMIN 2.8 (L) 06/23/2022 0344   ALBUMIN 4.3 03/27/2022 1417   AST 26 06/23/2022 0344   ALT 15 06/23/2022 0344   ALKPHOS 45 06/23/2022 0344   BILITOT 0.7 06/23/2022 0344   BILITOT 0.5 03/27/2022 1417   GFRNONAA 45 (L) 06/25/2022 0211   GFRNONAA 45 (L) 02/10/2020 1222   GFRAA 58 (L) 09/23/2020 1627   GFRAA 52 (L) 02/10/2020 1222   Lab Results  Component Value Date   CHOL 157 03/27/2022   HDL 68 03/27/2022   LDLCALC 64 03/27/2022   TRIG 145 03/27/2022   CHOLHDL 2.3 03/27/2022   Lab Results  Component Value Date   HGBA1C 5.3 03/27/2022   Lab Results  Component Value Date   VITAMINB12 491 07/02/2017   Lab Results  Component Value Date   TSH 0.883 11/18/2021      ASSESSMENT AND PLAN 72 y.o. year old female  has a past medical history of Agatston coronary artery calcium score greater than 400 (05/2022), Back pain, Blood transfusion, Chronic diastolic CHF (congestive heart failure) ([place]), COVID, DDD (degenerative disc disease), cervical, DDD (degenerative disc disease), lumbar, Fibromyalgia, GERD (gastroesophageal reflux disease), Heart murmur, Hiatal hernia, High blood pressure, High cholesterol, IBS (irritable bowel syndrome), OSA (obstructive sleep apnea), Osteoarthritis, Peripheral autonomic neuropathy of unknown cause, Pre-diabetes, PVC (premature ventricular  contraction), Raynaud disease, Rheumatoid arthritis ([place]), Sjogren's disease ([place]), Urticaria, and Vitamin D deficiency. here with:  Peripheral neuropathy Gait and Balance abnormality   Continue gabapentin-600 mg twice a day  Referral to PT advised if symptoms worsen or she develops new  symptoms she should let us know Follow-up in 1 year or sooner if needed     Ward Givens, MSN, NP-C 08/29/2022, 11:00 AM [facility] [address], [place], [place] [address] [phone]

## 2022-08-31 ENCOUNTER — Ambulatory Visit: Payer: PPO | Attending: Internal Medicine | Admitting: Internal Medicine

## 2022-08-31 ENCOUNTER — Encounter: Payer: Self-pay | Admitting: Internal Medicine

## 2022-08-31 VITALS — BP 104/56 | HR 72 | Ht 62.0 in | Wt 207.0 lb

## 2022-08-31 DIAGNOSIS — I493 Ventricular premature depolarization: Secondary | ICD-10-CM | POA: Diagnosis not present

## 2022-08-31 DIAGNOSIS — I5032 Chronic diastolic (congestive) heart failure: Secondary | ICD-10-CM | POA: Diagnosis not present

## 2022-08-31 MED ORDER — AMIODARONE HCL 200 MG PO TABS: 200.0000 mg | ORAL_TABLET | Freq: Every day | ORAL | 3 refills | Status: DC

## 2022-08-31 NOTE — Patient Instructions (Addendum)
Medication Instructions:  Your physician has recommended you make the following change in your medication: Medication Decreased: Amiodarone   Amiodarone:   You will take:  Take 1 tablet (200 mg total) by mouth daily. NO Amiodarone taken on Sundays    Lab Work: None ordered.  If you have labs (blood work) drawn today and your tests are completely normal, you will receive your results only by: [place] (if you have MyChart) OR A paper copy in the mail If you have any lab test that is abnormal or we need to change your treatment, we will call you to review the results.  Testing/Procedures: None ordered.  Follow-Up: At [facility], you and your health needs are our priority.  As part of our continuing mission to provide you with exceptional heart care, we have created designated Provider Care Teams.  These Care Teams include your primary Cardiologist (physician) and Advanced Practice Providers (APPs -  Physician Assistants and Nurse Practitioners) who all work together to provide you with the care you need, when you need it.  We recommend signing up for the patient portal called "MyChart".  Sign up information is provided on this After Visit Summary.  MyChart is used to connect with patients for Virtual Visits (Telemedicine).  Patients are able to view lab/test results, encounter notes, upcoming appointments, etc.  Non-urgent messages can be sent to your provider as well.   To learn more about what you can do with MyChart, go to [email or web address].    Your next appointment:   1 year(s)  The format for your next appointment:   In Person  Provider:   Cristopher Peru, MD{or one of the following Advanced Practice Providers on your designated Care Team:   [name], [place] Legrand Como "[facility]" [place], [place]

## 2022-08-31 NOTE — Progress Notes (Signed)
HPI [name] returns for ongoing evaluation of PVC's. 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 She wore a cardiac monitor which demonstrated 16% PVC's. She was started on amiodarone by me. She broke her femur in the interim but otherwise feels well. He palpitations are much improved on amio. Allergies  Allergen Reactions   Sulfa Antibiotics Hives and Itching    Flushing   Cymbalta [Duloxetine Hcl] Other (See Comments)    Manic reaction   Demerol Hives and Other (See Comments)    Fever    Ivp Dye [Iodinated Contrast Media] Hives and Other (See Comments)    Fever   Morphine And Related Other (See Comments)    ineffective   Sulfasalazine Hives and Other (See Comments)    Flushing   Adhesive [Tape] Rash     Current Outpatient Medications  Medication Sig Dispense Refill   amiodarone (PACERONE) 200 MG tablet Take 1 tablet (200 mg total) by mouth daily. 90 tablet 3   aspirin EC 81 MG tablet Take 81 mg by mouth at bedtime.     cetirizine (ZYRTEC) 10 MG tablet Take 1 tablet (10 mg total) by mouth daily. (Patient taking differently: Take 10 mg by mouth at bedtime.) 90 tablet 0   colestipol (COLESTID) 1 g tablet Take 2 g by mouth at bedtime.     cycloSPORINE (RESTASIS) 0.05 % ophthalmic emulsion Place 2 drops into both eyes 2 (two) times daily as needed (irritation).     diphenoxylate-atropine (LOMOTIL) 2.5-0.025 MG per tablet Take 1 tablet by mouth 4 (four) times daily as needed for diarrhea or loose stools.     doxylamine, Sleep, (UNISOM) 25 MG tablet Take 1 tablet (25 mg total) by mouth at bedtime as needed. (Patient taking differently: Take 25 mg by mouth at bedtime.) 30 tablet 0   fenofibrate (TRICOR) 48 MG tablet Take 1 tablet (48 mg total) by mouth daily. (Patient taking differently: Take 48 mg by mouth at bedtime.) 90 tablet 0   fluticasone (FLONASE) 50 MCG/ACT nasal spray Place 1 spray into both nostrils daily. (Patient taking differently: Place 1  spray into both nostrils as needed for allergies.) 16 g 0   gabapentin (NEURONTIN) 300 MG capsule TAKE 2 CAPSULES BY MOUTH 2 TIMES DAILY. (Patient taking differently: Take 600 mg by mouth 2 (two) times daily.) 360 capsule 3   hydroxychloroquine (PLAQUENIL) 200 MG tablet Take 1 tablet (200 mg total) by mouth daily. (Patient taking differently: Take 200 mg by mouth every Monday, Tuesday, Wednesday, Thursday, and Friday.) 90 tablet 0   hyoscyamine (ANASPAZ) 0.125 MG TBDP disintergrating tablet Place 0.25 mg under the tongue every 6 (six) hours as needed for cramping.      ibuprofen (ADVIL) 800 MG tablet Take 1 tablet (800 mg total) by mouth every 6 (six) hours as needed for mild pain. 90 tablet 1   leflunomide (ARAVA) 20 MG tablet TAKE 1 TABLET (20 MG TOTAL) BY MOUTH DAILY. 90 tablet 0   lidocaine (LIDODERM) 5 % PLACE 1 PATCH ONTO THE SKIN DAILY AS NEEDED FOR PAIN. REMOVE AND DISCARD PATCH WITHIN 12 HOURS OR AS DIRECTED BY MD. (Patient taking differently: Place 1 patch onto the skin 3 (three) times daily as needed (pain).) 90 patch 0   methocarbamol (ROBAXIN) 500 MG tablet Take 1 tablet (500 mg total) by mouth every 6 (six) hours as needed for muscle spasms. 120 tablet 1   metoprolol succinate (TOPROL-XL) 25 MG 24  hr tablet Take 0.5 tablets (12.5 mg total) by mouth daily. 45 tablet 3   Multiple Vitamin (MULTIVITAMIN WITH MINERALS) TABS tablet Take 1 tablet by mouth daily.     NONFORMULARY OR COMPOUNDED ITEM Triamcinolone 0.1% & Silvadene cream 1:1- Apply as directed to affected areas as needed     omeprazole (PRILOSEC) 40 MG capsule Take 40 mg by mouth as needed (nausea).     potassium chloride (KLOR-CON) 10 MEQ tablet Take 2 tablets (20 mEq total) by mouth daily. 180 tablet 3   Probiotic Product (PROBIOTIC DAILY PO) Take by mouth. Ultraflora IB Probiotic     promethazine (PHENERGAN) 25 MG tablet TAKE 1 TABLET BY MOUTH EVERY 6 HOURS AS NEEDED. (Patient taking differently: Take 25 mg by mouth every 6 (six)  hours as needed for nausea or vomiting.) 90 tablet 0   rosuvastatin (CRESTOR) 10 MG tablet Take 1 tablet (10 mg total) by mouth daily. 90 tablet 3   Semaglutide, 1 MG/DOSE, 4 MG/3ML SOPN Inject 1 mg as directed once a week. (Patient taking differently: Inject 1 mg as directed every Sunday.) 9 mL 0   spironolactone (ALDACTONE) 25 MG tablet Take 12.5 mg by mouth daily.     torsemide 40 MG TABS Take 40 mg by mouth daily. 30 tablet 0   UNABLE TO FIND CPAP: AT bedtime; setting is "12"     No current facility-administered medications for this visit.     Past Medical History:  Diagnosis Date   Agatston coronary artery calcium score greater than 400 05/2022   coronary Ca score 856   Back pain    Blood transfusion    1981   Chronic diastolic CHF (congestive heart failure) (HCC)    diastolic    COVID    DDD (degenerative disc disease), cervical    DDD (degenerative disc disease), lumbar    Fibromyalgia    GERD (gastroesophageal reflux disease)    Heart murmur    as a child   Hiatal hernia    sjorgens syndrome   High blood pressure    High cholesterol    IBS (irritable bowel syndrome)    OSA (obstructive sleep apnea)    Osteoarthritis    Peripheral autonomic neuropathy of unknown cause    Pre-diabetes    PVC (premature ventricular contraction)    Raynaud disease    Rheumatoid arthritis (HCC)    Sjogren's disease (HCC)    Urticaria    Vitamin D deficiency     ROS:   All systems reviewed and negative except as noted in the HPI.   Past Surgical History:  Procedure Laterality Date   ABDOMINAL HYSTERECTOMY     BTL, BSO   ADENOIDECTOMY     BREAST EXCISIONAL BIOPSY Left    BREAST SURGERY     mass removal    CHOLECYSTECTOMY     dental implants     DIAGNOSTIC LAPAROSCOPY     x3   FEMUR FRACTURE SURGERY Right    KNEE ARTHROSCOPY     x2   MASS EXCISION Left 03/22/2017   Procedure: EXCISION OF LEFT BREAST MASS;  Surgeon: Donnie Mesa, MD;  Location: [facility];  Service:  General;  Laterality: Left;   ORIF FEMUR FRACTURE Right 06/22/2022   Procedure: RIGHT OPEN REDUCTION INTERNAL FIXATION (ORIF) DISTAL FEMUR FRACTURE;  Surgeon: Altamese Fieldale, MD;  Location: [facility];  Service: Orthopedics;  Laterality: Right;   patotid cystectomy     RIGHT/LEFT HEART CATH AND CORONARY ANGIOGRAPHY N/A 07/25/2019  Procedure: RIGHT/LEFT HEART CATH AND CORONARY ANGIOGRAPHY;  Surgeon: Jolaine Artist, MD;  Location: [facility];  Service: Cardiovascular;  Laterality: N/A;   TENDON REPAIR  1980   left ankle and tibia   TONSILLECTOMY     TOTAL KNEE ARTHROPLASTY  06/21/2011   Procedure: TOTAL KNEE ARTHROPLASTY;  Surgeon: Gearlean Alf;  Location: [facility];  Service: Orthopedics;  Laterality: Right;   TUBAL LIGATION       Family History  Problem Relation Age of Onset   Alzheimer's disease Mother    Heart attack Mother    Hypertension Mother    Glaucoma Mother    High Cholesterol Mother    Heart disease Mother    Depression Mother    Cancer Father    High Cholesterol Father    Heart disease Father    Sleep apnea Father    Breast cancer Maternal Aunt    Heart attack Brother    Heart disease Brother    Glaucoma Brother    Hyperlipidemia Brother    Glaucoma Brother    Asthma Son    Allergic rhinitis Neg Hx    Angioedema Neg Hx    Eczema Neg Hx    Urticaria Neg Hx    Neuropathy Neg Hx      Social History   Socioeconomic History   Marital status: Widowed    Spouse name: Not on file   Number of children: 2   Years of education: AS   Highest education level: Not on file  Occupational History   Occupation: retired Marine scientist   Occupation: RN  Tobacco Use   Smoking status: Never    Passive exposure: Past   Smokeless tobacco: Never  Vaping Use   Vaping Use: Never used  Substance and Sexual Activity   Alcohol use: Yes    Comment: socially    Drug use: No   Sexual activity: Never  Other Topics Concern   Not on file  Social History Narrative   Lives  at home alone   Right-handed   Drinks 1 or less cups of coffee and 2 servings of either tea or soda per day   Social Determinants of Health   Financial Resource Strain: Not on file  Food Insecurity: No Food Insecurity (06/22/2022)   Hunger Vital Sign    Worried About Running Out of Food in the Last Year: Never true    Ran Out of Food in the Last Year: Never true  Transportation Needs: No Transportation Needs (06/22/2022)   PRAPARE - Hydrologist (Medical): No    Lack of Transportation (Non-Medical): No  Physical Activity: Not on file  Stress: Not on file  Social Connections: Not on file  Intimate Partner Violence: Not At Risk (06/22/2022)   Humiliation, Afraid, Rape, and Kick questionnaire    Fear of Current or Ex-Partner: No    Emotionally Abused: No    Physically Abused: No    Sexually Abused: No     BP (!) 104/56   Pulse 72   Ht '5\' 2"'$  (1.575 m)   Wt 207 lb (93.9 kg)   SpO2 95%   BMI 37.86 kg/m   Physical Exam:  Chronically ill appearing NAD HEENT: Unremarkable Neck:  No JVD, no thyromegally Lymphatics:  No adenopathy Back:  No CVA tenderness Lungs:  Clear with no wheezes HEART:  Regular rate rhythm, no murmurs, no rubs, no clicks Abd:  soft, positive bowel sounds, no organomegally, no rebound,  no guarding Ext:  2 plus pulses, no edema, no cyanosis, no clubbing Skin:  No rashes no nodules Neuro:  CN II through XII intact, motor grossly intact  EKG - nsr with rare PVC  Assess/Plan:  PVC's -She will continue amio and I have asked her to reduce to 6 tabs a week, none on Sunday.  CAD - her coronary CT scan is elevated. I'l;l defer management to Dr. Radford Pax.   Cristopher Peru, MD

## 2022-09-04 ENCOUNTER — Telehealth: Payer: Self-pay | Admitting: *Deleted

## 2022-09-04 NOTE — Telephone Encounter (Signed)
Calling about this patient to let provider know that she has been out to see patient and that patient has a wound on her butt and it looked pretty bad.  She wanted to see about a referral for wound care but between the call earlier and this one she said that Dr. Marcelino Scot placed the referral for her.  Routing to PCP as an Pharmacist, hospital. Marland Kitchenmemd

## 2022-09-04 NOTE — Telephone Encounter (Signed)
Thank you :)

## 2022-09-05 ENCOUNTER — Telehealth: Payer: Self-pay | Admitting: *Deleted

## 2022-09-05 ENCOUNTER — Other Ambulatory Visit: Payer: Self-pay | Admitting: Nurse Practitioner

## 2022-09-05 DIAGNOSIS — L89322 Pressure ulcer of left buttock, stage 2: Secondary | ICD-10-CM

## 2022-09-05 MED ORDER — MUPIROCIN 2 % EX OINT: 1.0000 | TOPICAL_OINTMENT | Freq: Two times a day (BID) | CUTANEOUS | 1 refills | Status: DC

## 2022-09-05 MED ORDER — AMOXICILLIN-POT CLAVULANATE 875-125 MG PO TABS: 1.0000 | ORAL_TABLET | Freq: Two times a day (BID) | ORAL | 0 refills | Status: DC

## 2022-09-05 NOTE — Telephone Encounter (Signed)
Pt calling in to say that she does not get to see wound care until 09/26/22.  She wanted to know if provider could send in antibiotic for the wound on her buttocks.  She said that she was seen previously and did have sore then and it is getting worse. Please advise.

## 2022-09-05 NOTE — Telephone Encounter (Signed)
I have sent augmentin 875 mg twice daily for 10 days. I also added bactroban ointment. This is applied to effected area twice daily for the time she is on antibiotics and then as needed. She should keep the area clean and dry. Sit on cushion or keep area from further pressure injury.

## 2022-09-06 NOTE — Telephone Encounter (Signed)
Informed pt of below and she asked about the cream said that it instructed her to put it in her nose and I clarified with provider and it is to be put on wound. [name], CMA

## 2022-09-07 ENCOUNTER — Encounter (HOSPITAL_BASED_OUTPATIENT_CLINIC_OR_DEPARTMENT_OTHER): Payer: PPO | Attending: General Surgery | Admitting: General Surgery

## 2022-09-07 DIAGNOSIS — W19XXXA Unspecified fall, initial encounter: Secondary | ICD-10-CM | POA: Insufficient documentation

## 2022-09-07 DIAGNOSIS — I5032 Chronic diastolic (congestive) heart failure: Secondary | ICD-10-CM | POA: Diagnosis not present

## 2022-09-07 DIAGNOSIS — G4733 Obstructive sleep apnea (adult) (pediatric): Secondary | ICD-10-CM | POA: Diagnosis not present

## 2022-09-07 DIAGNOSIS — M069 Rheumatoid arthritis, unspecified: Secondary | ICD-10-CM | POA: Insufficient documentation

## 2022-09-07 DIAGNOSIS — I7300 Raynaud's syndrome without gangrene: Secondary | ICD-10-CM | POA: Diagnosis not present

## 2022-09-07 DIAGNOSIS — L89314 Pressure ulcer of right buttock, stage 4: Secondary | ICD-10-CM | POA: Insufficient documentation

## 2022-09-07 DIAGNOSIS — N1832 Chronic kidney disease, stage 3b: Secondary | ICD-10-CM | POA: Diagnosis not present

## 2022-09-07 DIAGNOSIS — L89313 Pressure ulcer of right buttock, stage 3: Secondary | ICD-10-CM | POA: Diagnosis not present

## 2022-09-07 NOTE — Progress Notes (Addendum)
RENIKA, SHIFLET ([id number]) [id number] Page 1 of 12 Visit Report for 09/07/2022 Chief Complaint Document Details Patient Name: Date of Service: [name], [name] 09/07/2022 9:15 A M Medical Record Number: [id number] Patient Account Number: [id number] Date of Birth/Sex: Treating RN: [birth year] (72 y.o. F) Primary Care Provider: Leretha Pol Other Clinician: Referring Provider: Treating Provider/Extender: Olin Pia in Treatment: 0 Information Obtained from: Patient Chief Complaint Patient is at the clinic for treatment of open pressure ulcers Electronic Signature(s) Signed: 09/07/2022 11:54:27 AM By: Fredirick Maudlin MD FACS Previous Signature: 09/07/2022 10:43:49 AM Version By: Fredirick Maudlin MD FACS Previous Signature: 09/07/2022 9:13:41 AM Version By: Fredirick Maudlin MD FACS Entered By: Fredirick Maudlin on 09/07/2022 11:54:26 -------------------------------------------------------------------------------- Debridement Details Patient Name: Date of Service: [name]. 09/07/2022 9:15 A M Medical Record Number: [id number] Patient Account Number: [id number] Date of Birth/Sex: Treating RN: [birth year] (72 y.o. [name] Primary Care Provider: Leretha Pol Other Clinician: Referring Provider: Treating Provider/Extender: Olin Pia in Treatment: 0 Debridement Performed for Assessment: Wound #2 Right,Distal Gluteus Performed By: Physician Fredirick Maudlin, MD Debridement Type: Debridement Level of Consciousness (Pre-procedure): Awake and Alert Pre-procedure Verification/Time Out Yes - 10:15 Taken: Start Time: 10:15 Pain Control: Lidocaine 5% topical ointment T Area Debrided (L x W): otal 0.7 (cm) x 1 (cm) = 0.7 (cm) Tissue and other material debrided: Non-Viable, Oxbow Level: Non-Viable Tissue Debridement Description: Selective/Open Wound Instrument: Curette Bleeding:  Minimum Hemostasis Achieved: Pressure Procedural Pain: 0 Post Procedural Pain: 0 Response to Treatment: Procedure was tolerated well Level of Consciousness (Post- Awake and Alert procedure): Post Debridement Measurements of Total Wound Length: (cm) 0.7 Stage: Category/Stage III Width: (cm) 1 Depth: (cm) 0.1 Volume: (cm) 0.055 Character of Wound/Ulcer Post Debridement: Improved Post Procedure Diagnosis [name], [name] ([id number]) [id number] Page 2 of 12 Same as Pre-procedure Notes scribed by Baruch Gouty, RN for Dr. Celine Ahr Electronic Signature(s) Signed: 09/07/2022 12:10:53 PM By: Fredirick Maudlin MD FACS Signed: 09/07/2022 4:03:34 PM By: Baruch Gouty RN, BSN Entered By: Baruch Gouty on 09/07/2022 10:29:55 -------------------------------------------------------------------------------- Debridement Details Patient Name: Date of Service: [name] 09/07/2022 9:15 A M Medical Record Number: [id number] Patient Account Number: [id number] Date of Birth/Sex: Treating RN: 06-Oct-1950 (72 y.o. [name] Primary Care Provider: Leretha Pol Other Clinician: Referring Provider: Treating Provider/Extender: Olin Pia in Treatment: 0 Debridement Performed for Assessment: Wound #1 Right Gluteus Performed By: Physician Fredirick Maudlin, MD Debridement Type: Debridement Level of Consciousness (Pre-procedure): Awake and Alert Pre-procedure Verification/Time Out Yes - 10:15 Taken: Start Time: 10:15 Pain Control: Lidocaine 5% topical ointment T Area Debrided (L x W): otal 3.5 (cm) x 8 (cm) = 28 (cm) Tissue and other material debrided: Viable, Non-Viable, Eschar, Fat, Muscle, Slough, Subcutaneous, Slough Level: Skin/Subcutaneous Tissue/Muscle Debridement Description: Excisional Instrument: Curette, Forceps, Scissors Bleeding: Minimum Hemostasis Achieved: Pressure Procedural Pain: 4 Post Procedural Pain:  2 Response to Treatment: Procedure was tolerated well Level of Consciousness (Post- Awake and Alert procedure): Post Debridement Measurements of Total Wound Length: (cm) 3.5 Stage: Category/Stage IV Width: (cm) 8 Depth: (cm) 6 Volume: (cm) 131.947 Character of Wound/Ulcer Post Debridement: Improved Post Procedure Diagnosis Same as Pre-procedure Notes scribed by Baruch Gouty, RN for Dr. Celine Ahr Electronic Signature(s) Signed: 09/07/2022 12:10:53 PM By: Fredirick Maudlin MD FACS Signed: 09/07/2022 4:03:34 PM By: Baruch Gouty RN, BSN Entered By: Baruch Gouty on 09/07/2022 11:54:52 [name] ([id number]) [id number] Page 3 of 12 -------------------------------------------------------------------------------- HPI Details Patient Name: Date of  Service: Marisa, Cooper 09/07/2022 9:15 A M Medical Record Number: [id number] Patient Account Number: [id number] Date of Birth/Sex: Treating RN: April 21, 1951 (72 y.o. F) Primary Care Provider: Leretha Pol Other Clinician: Referring Provider: Treating Provider/Extender: Olin Pia in Treatment: 0 History of Present Illness HPI Description: ADMISSION 09/07/2023 This is a 72 year old woman with a past medical history notable for obesity, congestive heart failure, Raynaud's syndrome, CKD stage IIIb, osteoporosis, and rheumatoid arthritis. In November 2023, she suffered a fall that resulted in a femur fracture. She was hospitalized for about a week and underwent surgical repair of the fracture. She subsequently developed pressure ulcers on her right buttocks and ischium. She has been receiving home health services and they have been applying Medihoney. They have been reporting the wounds as stage II, but on evaluation, the large ulcer was probably unstageable at the time they were evaluating it but it is clearly a stage IV at this point. The smaller ulcer does have fat layer exposure  and therefore is a stage III. The patient is accompanied by her daughter. She says she has been sleeping on a regular bed but recently ordered an air mattress T opper, but does not have it yet. She is on Ozempic for weight loss and therefore has a poor appetite and struggles to get adequate protein intake. Electronic Signature(s) Signed: 09/07/2022 11:54:47 AM By: Fredirick Maudlin MD FACS Previous Signature: 09/07/2022 10:45:30 AM Version By: Fredirick Maudlin MD FACS Previous Signature: 09/07/2022 9:15:20 AM Version By: Fredirick Maudlin MD FACS Entered By: Fredirick Maudlin on 09/07/2022 11:54:47 -------------------------------------------------------------------------------- Physical Exam Details Patient Name: Date of Service: [name] 09/07/2022 9:15 A M Medical Record Number: [id number] Patient Account Number: [id number] Date of Birth/Sex: Treating RN: 01-28-1951 (72 y.o. F) Primary Care Provider: Leretha Pol Other Clinician: Referring Provider: Treating Provider/Extender: Olin Pia in Treatment: 0 Constitutional . . . . No acute distress. Respiratory Normal work of breathing on room air. Notes 09/07/2022: At the mid portion of her right buttock, she has a large ulcer with thick black eschar. It has peeled away at the edge to reveal a deep cavity. Bone is covered with tissue, but the wound extends to that point. There is extensive necrotic muscle and purulent drainage. There is a smaller and more superficial ulcer caudal to this. There is some abraded skin more laterally and then medial to this there is an ulcer with slough accumulation on the surface. It does extend into the fat layer. Electronic Signature(s) Signed: 09/07/2022 11:55:03 AM By: Fredirick Maudlin MD FACS Previous Signature: 09/07/2022 10:51:34 AM Version By: Fredirick Maudlin MD FACS Entered By: Fredirick Maudlin on 09/07/2022  11:55:02 -------------------------------------------------------------------------------- Physician Orders Details Patient Name: Date of Service: [name] 09/07/2022 9:15 A M Medical Record Number: [id number] Patient Account Number: [id number] Date of Birth/Sex: Treating RN: 05/22/1951 (72 y.o. [name] Primary Care Provider: Leretha Pol Other Clinician: KELYN, PONCIANO ([id number]) [id number] Page 4 of 12 Referring Provider: Treating Provider/Extender: Olin Pia in Treatment: 0 Verbal / Phone Orders: No Diagnosis Coding ICD-10 Coding Code Description L89.314 Pressure ulcer of right buttock, stage 4 L89.313 Pressure ulcer of right buttock, stage 3 I73.00 Raynaud's syndrome without gangrene O17.51 Chronic diastolic (congestive) heart failure N18.32 Chronic kidney disease, stage 3b R73.02 Impaired glucose tolerance (oral) E66.9 Obesity, unspecified Follow-up Appointments ppointment in 1 week. - Dr. Celine Ahr RM 1 Return A Friday 2/2 at 2:00 pm Anesthetic Wound #1 Right Gluteus (In  clinic) Topical Lidocaine 5% applied to wound bed Wound #2 Right,Distal Gluteus (In clinic) Topical Lidocaine 5% applied to wound bed Bathing/ Shower/ Hygiene May shower and wash wound with soap and water. Off-Loading Low air-loss mattress (Group 2) - ordered from [facility] needs hospital bed Turn and reposition every 2 hours - avoid lying on back, stand at least every hour while out of bed Additional Orders / Instructions Follow Nutritious Diet - increase protein to 70-80 gms per day, hold ozempic Home Health New wound care orders this week; continue Home Health for wound care. May utilize formulary equivalent dressing for wound treatment orders unless otherwise specified. - see orders Other Home Health Orders/Instructions: - [facility] Wound Treatment Wound #1 - Gluteus Wound Laterality: Right Cleanser: Wound  Cleanser 1 x Per Day/30 Days Discharge Instructions: Cleanse the wound with wound cleanser prior to applying a clean dressing using gauze sponges, not tissue or cotton balls. Prim Dressing: Dakin's Solution 0.25%, 16 (oz) 1 x Per Day/30 Days ary Discharge Instructions: Moisten gauze with Dakin's solution and pack into wound Secondary Dressing: Zetuvit Plus Silicone Border Sacrum Dressing, Sm, 7x7 (in/in) 1 x Per Day/30 Days Discharge Instructions: Apply silicone border over primary dressing as directed. Wound #2 - Gluteus Wound Laterality: Right, Distal Prim Dressing: Sorbalgon [employer] Dressing 2x2 (in/in) 1 x Per Day/30 Days ary Discharge Instructions: Apply to wound bed as instructed Secondary Dressing: Zetuvit Plus Silicone Border Dressing 4x4 (in/in) 1 x Per Day/30 Days Discharge Instructions: Apply silicone border over primary dressing as directed. Laboratory naerobe culture (MICRO) - right gluteus Bacteria identified in Unspecified specimen by A LOINC Code: 169-6 Convenience Name: Anaerobic culture Patient Medications llergies: Sulfa (Sulfonamide Antibiotics), Cymbalta, Demerol, Iodinated Contrast Media, morphine, sulfasalazine, adhesive A Notifications Medication Indication [address] SE[place], ENGH ([id number]) [id number] Page 5 of 12 prior to debridement 09/07/2022 lidocaine DOSE topical 5 % cream - cream topical Electronic Signature(s) Signed: 09/07/2022 12:10:53 PM By: Fredirick Maudlin MD FACS Entered By: Fredirick Maudlin on 09/07/2022 11:55:16 -------------------------------------------------------------------------------- Problem List Details Patient Name: Date of Service: [name]. 09/07/2022 9:15 A M Medical Record Number: [id number] Patient Account Number: [id number] Date of Birth/Sex: Treating RN: [birth year] (72 y.o. 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, stage 4 09/07/2022 No Yes L89.313 Pressure ulcer of right buttock, stage 3 09/07/2022 No Yes I73.00 Raynaud's syndrome without gangrene 09/07/2022 No Yes P82.42 Chronic diastolic (congestive) heart failure 09/07/2022 No Yes N18.32 Chronic kidney disease, stage 3b 09/07/2022 No Yes R73.02 Impaired glucose tolerance (oral) 09/07/2022 No Yes E66.9 Obesity, unspecified 09/07/2022 No Yes Inactive Problems Resolved Problems Electronic Signature(s) Signed: 09/07/2022 11:53:34 AM By: Fredirick Maudlin MD FACS Previous Signature: 09/07/2022 10:42:44 AM Version By: Fredirick Maudlin MD FACS Previous Signature: 09/07/2022 10:42:16 AM Version By: Fredirick Maudlin MD FACS Previous Signature: 09/07/2022 9:12:28 AM Version By: Fredirick Maudlin MD FACS Entered By: Fredirick Maudlin on 09/07/2022 11:53:34 [name] ([id number]) [id number] Page 6 of 12 -------------------------------------------------------------------------------- Progress Note Details Patient Name: Date of Service: [name], [name] 09/07/2022 9:15 A M Medical Record Number: [id number] Patient Account Number: [id number] Date of Birth/Sex: Treating RN: 1951/05/02 (72 y.o. F) Primary Care Provider: Leretha Pol Other Clinician: Referring Provider: Treating Provider/Extender: Olin Pia in Treatment: 0 Subjective Chief Complaint Information obtained from Patient Patient is at the clinic for treatment  of open pressure ulcers History of Present Illness (HPI) ADMISSION 09/07/2023 This is a 72 year old woman with a past medical history notable for obesity, congestive heart failure, Raynaud's syndrome, CKD stage IIIb, osteoporosis, and rheumatoid arthritis. In November 2023, she suffered a fall that resulted in a femur  fracture. She was hospitalized for about a week and underwent surgical repair of the fracture. She subsequently developed pressure ulcers on her right buttocks and ischium. She has been receiving home health services and they have been applying Medihoney. They have been reporting the wounds as stage II, but on evaluation, the large ulcer was probably unstageable at the time they were evaluating it but it is clearly a stage IV at this point. The smaller ulcer does have fat layer exposure and therefore is a stage III. The patient is accompanied by her daughter. She says she has been sleeping on a regular bed but recently ordered an air mattress T opper, but does not have it yet. She is on Ozempic for weight loss and therefore has a poor appetite and struggles to get adequate protein intake. Patient History Information obtained from Patient, Caregiver, Chart. Allergies Sulfa (Sulfonamide Antibiotics) (Reaction: hives), Cymbalta (Reaction: manic), Demerol (Reaction: hives), Iodinated Contrast Media (Reaction: hives), morphine, sulfasalazine (Reaction: hives), adhesive (Reaction: rash) Family History Cancer - Father,Paternal Grandparents, Heart Disease - Mother,Father,Paternal Grandparents, Hypertension - Mother, Stroke - Maternal Grandparents, No family history of Diabetes, Hereditary Spherocytosis, Kidney Disease, Lung Disease, Seizures, Thyroid Problems, Tuberculosis. 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 Medical History Eyes Denies history of Cataracts, Glaucoma, Optic Neuritis Ear/Nose/Mouth/Throat Patient has history of Chronic sinus problems/congestion - chronic rhinitis Denies history of Middle ear problems Respiratory Patient has history of Sleep Apnea - uses CPAP Cardiovascular Patient has history of Congestive Heart Failure Endocrine Denies history of Type I Diabetes, Type II  Diabetes Genitourinary Denies history of End Stage Renal Disease Immunological Patient has history of Raynaudoos Denies history of Lupus Erythematosus, Scleroderma Integumentary (Skin) Denies history of History of Burn Musculoskeletal Patient has history of Rheumatoid Arthritis Denies history of Gout, Osteoarthritis, Osteomyelitis Neurologic Patient has history of Neuropathy Denies history of Dementia, Quadriplegia, Paraplegia, Seizure Disorder Oncologic Denies history of Received Chemotherapy, Received Radiation Psychiatric Denies history of Anorexia/bulimia, Confinement Anxiety Hospitalization/Surgery History - right femur fx ORIR. - right knee replacement. - left breast mass excision. - left ankle tibia repair. - abdominal hysterectomy. - adnoidectomy/tonsillectomy. - cholecystectomy. - dental implants. - patoid cystectomy. - tubal ligation. Medical A Surgical History Notes nd Constitutional Symptoms (General Health) morbid obesity Cardiovascular hyperlipidemia Gastrointestinal SHARDEA, CWYNAR ([id number]) [id number] Page 7 of 12 GERD, IBS, eosinophilic esophagitis, diverticulosis, Genitourinary CKD stage 3 Immunological Sjoegren syndrome, fibromyalgia Review of Systems (ROS) Constitutional Symptoms (General Health) Denies complaints or symptoms of Fatigue, Fever, Chills, Marked Weight Change. Eyes Complains or has symptoms of Glasses / Contacts - contacts. Denies complaints or symptoms of Dry Eyes, Vision Changes. Ear/Nose/Mouth/Throat Denies complaints or symptoms of Chronic sinus problems or rhinitis. Respiratory Denies complaints or symptoms of Chronic or frequent coughs, Shortness of Breath. Cardiovascular Denies complaints or symptoms of Chest pain. Gastrointestinal Denies complaints or symptoms of Frequent diarrhea, Nausea, Vomiting. Endocrine Denies complaints or symptoms of Heat/cold intolerance. Genitourinary Denies complaints  or symptoms of Frequent urination. Integumentary (Skin) Complains or has symptoms of Wounds - buttocks. Musculoskeletal Complains or has symptoms of Muscle Pain. Neurologic Complains or has symptoms  of Numbness/parasthesias - feet. Psychiatric Denies complaints or symptoms of Claustrophobia. Objective Constitutional No acute distress. Vitals Time Taken: 9:23 AM, Height: 62 in, Weight: 207 lbs, BMI: 37.9, Temperature: 97.4 F, Pulse: 81 bpm, Respiratory Rate: 20 breaths/min, Blood Pressure: 114/69 mmHg. Respiratory Normal work of breathing on room air. General Notes: 09/07/2022: At the mid portion of her right buttock, she has a large ulcer with thick black eschar. It has peeled away at the edge to reveal a deep cavity. Bone is covered with tissue, but the wound extends to that point. There is extensive necrotic muscle and purulent drainage. There is a smaller and more superficial ulcer caudal to this. There is some abraded skin more laterally and then medial to this there is an ulcer with slough accumulation on the surface. It does extend into the fat layer. Integumentary (Hair, Skin) Wound #1 status is Open. Original cause of wound was Pressure Injury. The date acquired was: 08/11/2022. The wound is located on the Right Gluteus. The wound measures 3.5cm length x 8cm width x 6cm depth; 21.991cm^2 area and 131.947cm^3 volume. There is Fat Layer (Subcutaneous Tissue) exposed. There is no undermining noted, however, there is tunneling at 12:00 with a maximum distance of 8cm. There is a large amount of serosanguineous drainage noted. The wound margin is well defined and not attached to the wound base. There is medium (34-66%) red granulation within the wound bed. There is a medium (34- 66%) amount of necrotic tissue within the wound bed including Eschar and Adherent Slough. The periwound skin appearance had no abnormalities noted for texture. The periwound skin appearance had no abnormalities  noted for moisture. The periwound skin appearance had no abnormalities noted for color. Periwound temperature was noted as No Abnormality. The periwound has tenderness on palpation. Wound #2 status is Open. Original cause of wound was Pressure Injury. The date acquired was: 08/11/2022. The wound is located on the Right,Distal Gluteus. The wound measures 0.7cm length x 1cm width x 0.1cm depth; 0.55cm^2 area and 0.055cm^3 volume. There is Fat Layer (Subcutaneous Tissue) exposed. There is no tunneling or undermining noted. There is a medium amount of serosanguineous drainage noted. The wound margin is distinct with the outline attached to the wound base. There is no granulation within the wound bed. There is a large (67-100%) amount of necrotic tissue within the wound bed including Adherent Slough. The periwound skin appearance had no abnormalities noted for texture. The periwound skin appearance had no abnormalities noted for moisture. The periwound skin appearance had no abnormalities noted for color. The periwound has tenderness on palpation. Assessment Active Problems ICD-10 Pressure ulcer of right buttock, stage 4 Pressure ulcer of right buttock, stage 3 Raynaud's syndrome without gangrene Chronic diastolic (congestive) heart failure Chronic kidney disease, stage 3b Impaired glucose tolerance (oral) Obesity, unspecified LOUVINIA, CUMBO ([id number]) [id number] Page 8 of 12 Procedures Wound #1 Pre-procedure diagnosis of Wound #1 is a Pressure Ulcer located on the Right Gluteus . There was a Excisional Skin/Subcutaneous Tissue/Muscle Debridement with a total area of 28 sq cm performed by Fredirick Maudlin, MD. With the following instrument(s): Curette, Forceps, and Scissors to remove Viable and Non- Viable tissue/material. Material removed includes Fat, Muscle, Eschar, Subcutaneous Tissue, and Slough after achieving pain control using Lidocaine 5% topical ointment. A  time out was conducted at 10:15, prior to the start of the procedure. A Minimum amount of bleeding was controlled with Pressure. The procedure was tolerated well with a pain level of 4 throughout and a  pain level of 2 following the procedure. Post Debridement Measurements: 3.5cm length x 8cm width x 6cm depth; 131.947cm^3 volume. Post debridement Stage noted as Category/Stage IV. Character of Wound/Ulcer Post Debridement is improved. Post procedure Diagnosis Wound #1: Same as Pre-Procedure General Notes: scribed by Baruch Gouty, RN for Dr. Celine Ahr. Wound #2 Pre-procedure diagnosis of Wound #2 is a Pressure Ulcer located on the Right,Distal Gluteus . There was a Selective/Open Wound Non-Viable Tissue Debridement with a total area of 0.7 sq cm performed by Fredirick Maudlin, MD. With the following instrument(s): Curette to remove Non-Viable tissue/material. Material removed includes [facility] after achieving pain control using Lidocaine 5% topical ointment. A time out was conducted at 10:15, prior to the start of the procedure. A Minimum amount of bleeding was controlled with Pressure. The procedure was tolerated well with a pain level of 0 throughout and a pain level of 0 following the procedure. Post Debridement Measurements: 0.7cm length x 1cm width x 0.1cm depth; 0.055cm^3 volume. Post debridement Stage noted as Category/Stage III. Character of Wound/Ulcer Post Debridement is improved. Post procedure Diagnosis Wound #2: Same as Pre-Procedure General Notes: scribed by Baruch Gouty, RN for Dr. Celine Ahr. Plan Follow-up Appointments: Return Appointment in 1 week. - Dr. Celine Ahr RM 1 Friday 2/2 at 2:00 pm Anesthetic: Wound #1 Right Gluteus: (In clinic) Topical Lidocaine 5% applied to wound bed Wound #2 Right,Distal Gluteus: (In clinic) Topical Lidocaine 5% applied to wound bed Bathing/ Shower/ Hygiene: May shower and wash wound with soap and water. Off-Loading: Low air-loss mattress (Group 2) -  ordered from [facility] [employer] needs hospital bed Turn and reposition every 2 hours - avoid lying on back, stand at least every hour while out of bed Additional Orders / Instructions: Follow Nutritious Diet - increase protein to 70-80 gms per day, hold ozempic Home Health: New wound care orders this week; continue Home Health for wound care. May utilize formulary equivalent dressing for wound treatment orders unless otherwise specified. - see orders Other Home Health Orders/Instructions: - [facility] Laboratory ordered were: Anaerobic culture - right gluteus The following medication(s) was prescribed: lidocaine topical 5 % cream cream topical for prior to debridement was prescribed at facility WOUND #1: - Gluteus Wound Laterality: Right Cleanser: Wound Cleanser 1 x Per Day/30 Days Discharge Instructions: Cleanse the wound with wound cleanser prior to applying a clean dressing using gauze sponges, not tissue or cotton balls. Prim Dressing: Dakin's Solution 0.25%, 16 (oz) 1 x Per Day/30 Days ary Discharge Instructions: Moisten gauze with Dakin's solution and pack into wound Secondary Dressing: Zetuvit Plus Silicone Border Sacrum Dressing, Sm, 7x7 (in/in) 1 x Per Day/30 Days Discharge Instructions: Apply silicone border over primary dressing as directed. WOUND #2: - Gluteus Wound Laterality: Right, Distal Prim Dressing: Sorbalgon AG Dressing 2x2 (in/in) 1 x Per Day/30 Days ary Discharge Instructions: Apply to wound bed as instructed Secondary Dressing: Zetuvit Plus Silicone Border Dressing 4x4 (in/in) 1 x Per Day/30 Days Discharge Instructions: Apply silicone border over primary dressing as directed. 09/07/2022: This is a 72 year old woman who fractured her femur and underwent surgical repair. She has been fairly immobilized since that operation and has developed pressure ulcers. At the mid portion of her right buttock, she has a large ulcer with thick black eschar. It has peeled away at the  edge to reveal a deep cavity. Bone is covered with tissue, but the wound extends to that point. There is extensive necrotic muscle and purulent drainage. There is a smaller and more superficial ulcer caudal to  this. There is some abraded skin more laterally and then medial to this there is an ulcer with slough accumulation on the surface. It does extend into the fat layer. I used forceps and Metzenbaum scissors to cut away the eschar, nonviable subcutaneous tissue, necrotic fat and muscle from the large pressure ulcer. I took a culture due to the purulent drainage. I used a curette to debride slough from the smaller more superficial wound. We will apply silver alginate and a foam border dressing to the smaller wound. We will pack the larger wound with Dakin's moistened gauze. Once it is cleaner, she would be a good candidate for a wound VAC. She is not getting adequate protein intake secondary to being on Ozempic which is curbing her appetite to the point where she is not eating well. I have asked JENETTA, WEASE ([id number]) [id number] Page 9 of 12 her to hold the Ozempic for a period while we work on getting her wound healed. She needs to increase her oral protein intake to between 70 to 100 g of protein per day. We discussed strategies with the patient and her daughter about how she could do that. She needs to offload better and we have ordered a level 2 mattress and hospital bed for her. Once the cultures return I will prescribe appropriate antibiotic therapy if indicated. She will follow-up in 1 week. Electronic Signature(s) Signed: 09/07/2022 11:56:05 AM By: Fredirick Maudlin MD FACS Previous Signature: 09/07/2022 10:55:01 AM Version By: Fredirick Maudlin MD FACS Entered By: Fredirick Maudlin on 09/07/2022 11:56:05 -------------------------------------------------------------------------------- HxROS Details Patient Name: Date of Service: Marisa Ar Cooper. 09/07/2022  9:15 A M Medical Record Number: [id number] Patient Account Number: [id number] Date of Birth/Sex: Treating RN: 1950/12/23 (72 y.o. [name] Primary Care Provider: Leretha Pol Other Clinician: Referring Provider: Treating Provider/Extender: Olin Pia in Treatment: 0 Information Obtained From Patient Caregiver Chart Constitutional Symptoms (General Health) Complaints and Symptoms: Negative for: Fatigue; Fever; Chills; Marked Weight Change Medical History: Past Medical History Notes: morbid obesity Eyes Complaints and Symptoms: Positive for: Glasses / Contacts - contacts Negative for: Dry Eyes; Vision Changes Medical History: Negative for: Cataracts; Glaucoma; Optic Neuritis Ear/Nose/Mouth/Throat Complaints and Symptoms: Negative for: Chronic sinus problems or rhinitis Medical History: Positive for: Chronic sinus problems/congestion - chronic rhinitis Negative for: Middle ear problems Respiratory Complaints and Symptoms: Negative for: Chronic or frequent coughs; Shortness of Breath Medical History: Positive for: Sleep Apnea - uses CPAP Cardiovascular Complaints and Symptoms: Negative for: Chest pain Medical History: Positive for: Congestive Heart Failure Past Medical History Notes: hyperlipidemia Gastrointestinal RYLIN, SAEZ ([id number]) [id number] Page 10 of 12 Complaints and Symptoms: Negative for: Frequent diarrhea; Nausea; Vomiting Medical History: Past Medical History Notes: GERD, IBS, eosinophilic esophagitis, diverticulosis, Endocrine Complaints and Symptoms: Negative for: Heat/cold intolerance Medical History: Negative for: Type I Diabetes; Type II Diabetes Genitourinary Complaints and Symptoms: Negative for: Frequent urination Medical History: Negative for: End Stage Renal Disease Past Medical History Notes: CKD stage 3 Integumentary (Skin) Complaints and Symptoms: Positive for:  Wounds - buttocks Medical History: Negative for: History of Burn Musculoskeletal Complaints and Symptoms: Positive for: Muscle Pain Medical History: Positive for: Rheumatoid Arthritis Negative for: Gout; Osteoarthritis; Osteomyelitis Neurologic Complaints and Symptoms: Positive for: Numbness/parasthesias - feet Medical History: Positive for: Neuropathy Negative for: Dementia; Quadriplegia; Paraplegia; Seizure Disorder Psychiatric Complaints and Symptoms: Negative for: Claustrophobia Medical History: Negative for: Anorexia/bulimia; Confinement Anxiety Hematologic/Lymphatic Immunological Medical History: Positive for: Raynauds Negative for: Lupus Erythematosus; Scleroderma Past Medical History  Notes: Sjoegren syndrome, fibromyalgia Oncologic Medical History: Negative for: Received Chemotherapy; Received Radiation HBO Extended History Items Ear/Nose/Mouth/Throat: Chronic sinus problems/congestion Immunizations Pneumococcal Vaccine: ARRAYA, BUCK ([id number]) [id number] Page 11 of 12 Received Pneumococcal Vaccination: Yes Received Pneumococcal Vaccination On or After 60th Birthday: Yes Implantable Devices None Hospitalization / Surgery History Type of Hospitalization/Surgery right femur fx ORIR right knee replacement left breast mass excision left ankle tibia repair abdominal hysterectomy adnoidectomy/tonsillectomy cholecystectomy dental implants patoid cystectomy tubal ligation Family and Social History Cancer: Yes - Father,Paternal Grandparents; Diabetes: No; Heart Disease: Yes - Mother,Father,Paternal Grandparents; Hereditary Spherocytosis: No; Hypertension: Yes - Mother; Kidney Disease: No; Lung Disease: No; Seizures: No; Stroke: Yes - Maternal Grandparents; Thyroid Problems: No; Tuberculosis: No; Never smoker; Marital Status - Widowed; Alcohol Use: Rarely; Drug Use: Prior History - TCH; Caffeine Use: Daily - soda, tea; Financial  Concerns: No; Food, Clothing or Shelter Needs: No; Support System Lacking: No; Transportation Concerns: No Engineer, maintenance) Signed: 09/07/2022 12:10:53 PM By: Fredirick Maudlin MD FACS Signed: 09/07/2022 4:03:34 PM By: Baruch Gouty RN, BSN Entered By: Baruch Gouty on 09/07/2022 09:41:49 -------------------------------------------------------------------------------- [name] Details Patient Name: Date of Service: [name] 09/07/2022 Medical Record Number: [id number] Patient Account Number: [id number] Date of Birth/Sex: Treating RN: 1950/09/01 (72 y.o. 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 - DEB MUSC/FASCIA 20 SQ CM/< ICD-10 Diagnosis Description L89.314 Pressure ulcer of right buttock, stage 4 Modifier: Quantity: 1 : CPT4 Code: [id number] Description: 11046 - DEB MUSC/FASCIA EA ADDL 20 CM ICD-10 Diagnosis Description L89.314 Pressure ulcer of right buttock, stage 4 Modifier: Quantity: 1 : [name], [name] Code: [id number] YNN Cooper ([id number]) I Description: 70263 - DEBRIDE WOUND 1ST 20 SQ CM OR < ICD-10 Diagnosis Description [id number] CD-10 Diagnosis Description L89.313 Pressure ulcer of right buttock, stage 3 Modifier: _[id number] Quantity: 1 df Page 12 of 12 Physician Procedures : CPT4 Code Description Modifier [id number] 09628 - WC PHYS LEVEL 4 - NEW PT 25 ICD-10 Diagnosis Description L89.314 Pressure ulcer of right buttock,  stage 4 L89.313 Pressure ulcer of right buttock, stage 3 Z66.29 Chronic diastolic (congestive) heart  failure N18.32 Chronic kidney disease, stage 3b Quantity: 1 : [id number] 11043 - WC PHYS DEBR MUSCLE/FASCIA 20 SQ CM ICD-10 Diagnosis Description L89.314 Pressure ulcer of right buttock, stage 4 Quantity: 1 : [id number] 11046 - WC PHYS DEB MUSC/FASC EA ADDL 20 CM ICD-10 Diagnosis Description L89.314 Pressure ulcer of right buttock, stage 4 Quantity: 1 : [id number] 00174 - WC PHYS DEBR WO ANESTH 20 SQ CM ICD-10 Diagnosis Description L89.313 Pressure ulcer of right buttock, stage 3 Quantity: 1 Electronic Signature(s) Signed: 09/07/2022 11:56:27 AM By: Fredirick Maudlin MD FACS Previous Signature: 09/07/2022 10:56:57 AM Version By: Fredirick Maudlin MD FACS Entered By: Fredirick Maudlin on 09/07/2022 11:56:27

## 2022-09-07 NOTE — Progress Notes (Signed)
Marisa, Cooper ([id number]) [id number] Page 1 of 4 Visit Report for 09/07/2022 Abuse Risk Screen Details Patient Name: Date of Service: [name], [name] 09/07/2022 9:15 A M Medical Record Number: [id number] Patient Account Number: [id number] Date of Birth/Sex: Treating RN: 1951/04/06 (72 y.o. [name] Primary Care Damean Poffenberger: Leretha Pol Other Clinician: Referring Jaedon Siler: Treating Mieshia Pepitone/Extender: Olin Pia in Treatment: 0 Abuse Risk Screen Items Answer ABUSE RISK SCREEN: Has anyone close to you tried to hurt or harm you recentlyo No Do you feel uncomfortable with anyone in your familyo No Has anyone forced you do things that you didnt want to doo No Electronic Signature(s) Signed: 09/07/2022 4:03:34 PM By: Baruch Gouty RN, BSN Entered By: Baruch Gouty on 09/07/2022 09:42:06 -------------------------------------------------------------------------------- Activities of Daily Living Details Patient Name: Date of Service: Marisa, Cooper 09/07/2022 9:15 A M Medical Record Number: [id number] Patient Account Number: [id number] Date of Birth/Sex: Treating RN: 1950-09-17 (72 y.o. [name] Primary Care Tevis Dunavan: Leretha Pol Other Clinician: Referring Reika Callanan: Treating Kollin Udell/Extender: Olin Pia in Treatment: 0 Activities of Daily Living Items Answer Activities of Daily Living (Please select one for each item) Drive Automobile Not Able T Medications ake Completely Able Use T elephone Completely Able Care for Appearance Need Assistance Use T oilet Completely Able Bath / Shower Need Assistance Dress Self Need Assistance Feed Self Completely Able Walk Need Assistance Get In / Out Bed Need Assistance Housework Need Assistance Prepare Meals Need Assistance Handle Money Completely Able Shop for Self Need Assistance Electronic Signature(s) Signed:  09/07/2022 4:03:34 PM By: Baruch Gouty RN, BSN Entered By: Baruch Gouty on 09/07/2022 09:43:00 [name] ([id number]) [id number] Page 2 of 4 -------------------------------------------------------------------------------- Education Screening Details Patient Name: Date of Service: Marisa, Cooper 09/07/2022 9:15 A M Medical Record Number: [id number] Patient Account Number: [id number] Date of Birth/Sex: Treating RN: Mar 11, 1951 (72 y.o. [name] Primary Care Layann Bluett: Leretha Pol Other Clinician: Referring Lener Ventresca: Treating Siyah Mault/Extender: Olin Pia in Treatment: 0 Primary Learner Assessed: Patient Learning Preferences/Education Level/Primary Language Learning Preference: Explanation, Demonstration, Printed Material Highest Education Level: College or Above Preferred Language: English Cognitive Barrier Language Barrier: No Translator Needed: No Memory Deficit: No Emotional Barrier: No Cultural/Religious Beliefs Affecting Medical Care: No Physical Barrier Impaired Vision: No Impaired Hearing: No Decreased Hand dexterity: No Knowledge/Comprehension Knowledge Level: High Comprehension Level: High Ability to understand written instructions: High Ability to understand verbal instructions: High Motivation Anxiety Level: Calm Cooperation: Cooperative Education Importance: Acknowledges Need Interest in Health Problems: Asks Questions Perception: Coherent Willingness to Engage in Self-Management High Activities: Readiness to Engage in Self-Management High Activities: Electronic Signature(s) Signed: 09/07/2022 4:03:34 PM By: Baruch Gouty RN, BSN Entered By: Baruch Gouty on 09/07/2022 09:43:29 -------------------------------------------------------------------------------- Fall Risk Assessment Details Patient Name: Date of Service: [name] 09/07/2022 9:15 A M Medical Record  Number: [id number] Patient Account Number: [id number] Date of Birth/Sex: Treating RN: [birth year] (71 y.o. [name] Primary Care Hjalmar Ballengee: Leretha Pol Other Clinician: Referring Antionetta Ator: Treating Maryana Pittmon/Extender: Olin Pia in Treatment: 0 Fall Risk Assessment Items Have you had 2 or more falls in the last 12 monthso 0 No Marisa, Cooper E ([id number]) [id number] Page 3 of 4 Have you had any fall that resulted in injury in the last 12 monthso 0 Yes FALLS RISK SCREEN History of falling - immediate or within 3 months 25 Yes Secondary diagnosis (Do you have 2 or more medical diagnoseso)  0 No Ambulatory aid None/bed rest/wheelchair/nurse 0 No Crutches/cane/walker 15 Yes Furniture 0 No Intravenous therapy Access/Saline/Heparin Lock 0 No Gait/Transferring Normal/ bed rest/ wheelchair 0 No Weak (short steps with or without shuffle, stooped but able to lift head while walking, may seek 10 Yes support from furniture) Impaired (short steps with shuffle, may have difficulty arising from chair, head down, impaired 0 No balance) Mental Status Oriented to own ability 0 Yes Electronic Signature(s) Signed: 09/07/2022 4:03:34 PM By: Baruch Gouty RN, BSN Entered By: Baruch Gouty on 09/07/2022 09:44:07 -------------------------------------------------------------------------------- Foot Assessment Details Patient Name: Date of Service: [name]. 09/07/2022 9:15 A M Medical Record Number: [id number] Patient Account Number: [id number] Date of Birth/Sex: Treating RN: 03-16-1951 (72 y.o. [name] Primary Care Othmar Ringer: Leretha Pol Other Clinician: Referring Rhys Anchondo: Treating Vedanth Sirico/Extender: Olin Pia in Treatment: 0 Foot Assessment Items Site Locations + = Sensation present, - = Sensation absent, C = Callus, U = Ulcer R = Redness, W = Warmth, M = Maceration, PU =  Pre-ulcerative lesion F = Fissure, S = Swelling, D = Dryness Assessment Right: Left: Other Deformity: No No Prior Foot Ulcer: No No Prior Amputation: No No Charcot Joint: No No Ambulatory Status: Ambulatory With Help Assistance Device: ZETHA, KUHAR ([id number]) [phone] [id number] Page 4 of 4 Gait: Steady Electronic Signature(s) Signed: 09/07/2022 4:03:34 PM By: Baruch Gouty RN, BSN Entered By: Baruch Gouty on 09/07/2022 09:44:56 -------------------------------------------------------------------------------- Nutrition Risk Screening Details Patient Name: Date of Service: MARIJAH, LARRANAGA 09/07/2022 9:15 A M Medical Record Number: [id number] Patient Account Number: [id number] Date of Birth/Sex: Treating RN: [birth year] (72 y.o. [name] Primary Care Graceland Wachter: Leretha Pol Other Clinician: Referring Peg Fifer: Treating Eliyana Pagliaro/Extender: Olin Pia in Treatment: 0 Height (in): 62 Weight (lbs): 207 Body Mass Index (BMI): 37.9 Nutrition Risk Screening Items Score Screening NUTRITION RISK SCREEN: I have an illness or condition that made me change the kind and/or amount of food I eat 0 No I eat fewer than two meals per day 0 No I eat few fruits and vegetables, or milk products 0 No I have three or more drinks of beer, liquor or wine almost every day 0 No I have tooth or mouth problems that make it hard for me to eat 0 No I don't always have enough money to buy the food I need 0 No I eat alone most of the time 0 No I take three or more different prescribed or over-the-counter drugs a day 1 Yes Without wanting to, I have lost or gained 10 pounds in the last six months 0 No I am not always physically able to shop, cook and/or feed myself 0 No Nutrition Protocols Good Risk Protocol 0 No interventions needed Moderate Risk Protocol High Risk Proctocol Risk Level: Good Risk Score: 1 Electronic  Signature(s) Signed: 09/07/2022 4:03:34 PM By: Baruch Gouty RN, BSN Entered By: Baruch Gouty on 09/07/2022 09:44:44

## 2022-09-07 NOTE — Progress Notes (Signed)
[name], [name] ([id number]) [id number] Page 1 of 7 Visit Report for 09/07/2022 Allergy List Details Patient Name: Date of Service: [name], [name] 09/07/2022 9:15 A M Medical Record Number: [id number] Patient Account Number: [id number] Date of Birth/Sex: Treating RN: [birth year] (72 y.o. [name] Primary Care Rena Sweeden: Leretha Pol Other Clinician: Referring Rosaleah Person: Treating Oswald Pott/Extender: Olin Pia in Treatment: Cooper Allergies Active Allergies Sulfa (Sulfonamide Antibiotics) Reaction: hives Cymbalta Reaction: manic Demerol Reaction: hives Iodinated Contrast Media Reaction: hives morphine sulfasalazine Reaction: hives adhesive Reaction: rash Allergy Notes Electronic Signature(s) Signed: 09/07/2022 4:03:34 PM By: Baruch Gouty RN, BSN Entered By: Baruch Gouty on 09/07/2022 09:28:16 -------------------------------------------------------------------------------- [place] Details Patient Name: Date of Service: [name]. 09/07/2022 9:15 A M Medical Record Number: [id number] Patient Account Number: [id number] Date of Birth/Sex: Treating RN: May 11, 1951 (72 y.o. F) Primary Care Candice Lunney: Leretha Pol Other Clinician: Referring Salley Boxley: Treating Lamine Laton/Extender: Olin Pia in Treatment: Cooper Visit Information Patient Arrived: Wheel Chair Arrival Time: 09:23 Accompanied By: daughter Transfer Assistance: Manual Patient Identification Verified: Yes Secondary Verification Process Completed: Yes Patient Requires Transmission-Based Precautions: No Patient Has Alerts: No [name], [name] ([id number]) [id number] Page 2 of 7 Electronic Signature(s) Signed: 09/07/2022 4:03:34 PM By: Baruch Gouty RN, BSN Entered By: Baruch Gouty on 09/07/2022  09:25:55 -------------------------------------------------------------------------------- Lower Extremity Assessment Details Patient Name: Date of Service: [name], [name] 09/07/2022 9:15 A M Medical Record Number: [id number] Patient Account Number: [id number] Date of Birth/Sex: Treating RN: 1950-10-20 (72 y.o. [name] Primary Care Fatmata Legere: Leretha Pol Other Clinician: Referring Shyam Dawson: Treating Tyann Niehaus/Extender: Olin Pia in Treatment: Cooper Electronic Signature(s) Signed: 09/07/2022 4:03:34 PM By: Baruch Gouty RN, BSN Entered By: Baruch Gouty on 09/07/2022 09:45:03 -------------------------------------------------------------------------------- Multi Wound Chart Details Patient Name: Date of Service: [name] 09/07/2022 9:15 A M Medical Record Number: [id number] Patient Account Number: [id number] Date of Birth/Sex: Treating RN: May 11, 1951 (72 y.o. F) Primary Care Susumu Hackler: Leretha Pol Other Clinician: Referring Yulieth Carrender: Treating Logyn Dedominicis/Extender: Olin Pia in Treatment: Cooper Vital Signs Height(in): 75 Pulse(bpm): 81 Weight(lbs): 207 Blood Pressure(mmHg): 114/69 Body Mass Index(BMI): 37.9 Temperature(F): 97.4 Respiratory Rate(breaths/min): 20 [1:Photos:] [N/A:N/A] Right Gluteus Right, Distal Gluteus N/A Wound Location: Pressure Injury Pressure Injury N/A Wounding Event: Pressure Ulcer Pressure Ulcer N/A Primary Etiology: Chronic sinus problems/congestion, Chronic sinus problems/congestion, N/A Comorbid History: Sleep Apnea, Congestive Heart Sleep Apnea, Congestive Heart Failure, Raynauds, Rheumatoid Failure, Raynauds, Rheumatoid Arthritis, Neuropathy Arthritis, Neuropathy 08/11/2022 08/11/2022 N/A Date Acquired: Cooper Cooper N/A Weeks of Treatment: Open Open N/A Wound Status: No No N/A Wound Recurrence: 3.5x8x6 Cooper.7x1x0.1 N/A Measurements L x W x D (cm) 21.991 Cooper.55 N/A A (cm)  : rea 131.947 Cooper.055 N/A Volume (cm) : [name], [name] ([id number]) [id number] Page 3 of 7 12 Position 1 (o'clock): 8 Maximum Distance 1 (cm): Yes No N/A Tunneling: Category/Stage IV Category/Stage III N/A Classification: Large Medium N/A Exudate A mount: Serosanguineous Serosanguineous N/A Exudate Type: red, brown red, brown N/A Exudate Color: Well defined, not attached Distinct, outline attached N/A Wound Margin: Medium (34-66%) None Present (Cooper%) N/A Granulation A mount: Red N/A N/A Granulation Quality: Medium (34-66%) Large (67-100%) N/A Necrotic A mount: Eschar, Adherent Slough Adherent Slough N/A Necrotic Tissue: Fat Layer (Subcutaneous Tissue): Yes Fat Layer (Subcutaneous Tissue): Yes N/A Exposed Structures: Fascia: No Fascia: No Tendon: No Tendon: No Muscle: No Muscle: No Joint: No Joint: No Bone: No Bone: No N/A None N/A Epithelialization: Debridement - Excisional Debridement - Selective/Open  Wound N/A Debridement: Pre-procedure Verification/Time Out 10:15 10:15 N/A Taken: Lidocaine 5% topical ointment Lidocaine 5% topical ointment N/A Pain Control: Necrotic/Eschar, Muscle, Fat, Slough N/A Tissue Debrided: Subcutaneous, Slough Skin/Subcutaneous Tissue/Muscle Non-Viable Tissue N/A Level: 28 Cooper.7 N/A Debridement A (sq cm): rea Curette, Forceps, Scissors Curette N/A Instrument: Minimum Minimum N/A Bleeding: Pressure Pressure N/A Hemostasis Achieved: 4 Cooper N/A Procedural Pain: 2 Cooper N/A Post Procedural Pain: Debridement Treatment Response: Procedure was tolerated well Procedure was tolerated well N/A Post Debridement Measurements L x 3.5x8x6 Cooper.7x1x0.1 N/A W x D (cm) 131.947 Cooper.055 N/A Post Debridement Volume: (cm) Category/Stage III Category/Stage III N/A Post Debridement Stage: Excoriation: No Excoriation: No N/A Periwound Skin Texture: Induration: No Induration: No Callus: No Callus: No Crepitus: No Crepitus:  No Rash: No Rash: No Scarring: No Scarring: No Maceration: No Maceration: No N/A Periwound Skin Moisture: Dry/Scaly: No Dry/Scaly: No Atrophie Blanche: No Atrophie Blanche: No N/A Periwound Skin Color: Cyanosis: No Cyanosis: No Ecchymosis: No Ecchymosis: No Erythema: No Erythema: No Hemosiderin Staining: No Hemosiderin Staining: No Mottled: No Mottled: No Pallor: No Pallor: No Rubor: No Rubor: No No Abnormality N/A N/A Temperature: Yes Yes N/A Tenderness on Palpation: Debridement Debridement N/A Procedures Performed: Treatment Notes Electronic Signature(s) Signed: 09/07/2022 11:54:11 AM By: Fredirick Maudlin MD FACS Previous Signature: 09/07/2022 11:53:42 AM Version By: Fredirick Maudlin MD FACS Previous Signature: 09/07/2022 10:43:35 AM Version By: Fredirick Maudlin MD FACS Entered By: Fredirick Maudlin on 09/07/2022 11:54:11 -------------------------------------------------------------------------------- Pain Assessment Details Patient Name: Date of Service: [name]. 09/07/2022 9:15 A M Medical Record Number: [id number] Patient Account Number: [id number] Date of Birth/Sex: Treating RN: 24-Jun-1951 (72 y.o. [name] Primary Care Serayah Yazdani: Leretha Pol Other Clinician: Referring Gerber Penza: Treating Malkia Nippert/Extender: Olin Pia in Treatment: [address] [name], [name] ([id number]) [id number] Page 4 of 7 Active Problems Location of Pain Severity and Description of Pain Patient Has Paino Yes Site Locations Pain Location: Pain in Ulcers With Dressing Change: Yes Duration of the Pain. Constant / Intermittento Intermittent Rate the pain. Current Pain Level: 2 Worst Pain Level: 7 Least Pain Level: Cooper Character of Pain Describe the Pain: Aching Pain Management and Medication Current Pain Management: Medication: Yes Other: reposition Is the Current Pain Management Adequate: Adequate How does your  wound impact your activities of daily livingo Sleep: No Bathing: No Appetite: No Relationship With Others: No Bladder Continence: No Emotions: No Bowel Continence: No Hobbies: No Toileting: No Dressing: No Electronic Signature(s) Signed: 09/07/2022 4:03:34 PM By: Baruch Gouty RN, BSN Entered By: Baruch Gouty on 09/07/2022 10:05:35 -------------------------------------------------------------------------------- Wound Assessment Details Patient Name: Date of Service: [name] 09/07/2022 9:15 A M Medical Record Number: [id number] Patient Account Number: [id number] Date of Birth/Sex: Treating RN: [birth year] (72 y.o. 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 Wound Open Wounding Event: Pressure Injury Status: Date Acquired: 08/11/2022 Comorbid Chronic sinus problems/congestion, Sleep Apnea, Congestive Heart Weeks Of Treatment: Cooper History: Failure, Raynauds, Rheumatoid Arthritis, Neuropathy Clustered Wound: No Photos [name], [name] ([id number]) [id number] Page 5 of 7 Wound Measurements Length: (cm) 3.5 Width: (cm) 8 Depth: (cm) 6 Area: (cm) 21.991 Volume: (cm) 131.947 % Reduction in Area: % Reduction in Volume: Tunneling: Yes Position (o'clock): 12 Maximum Distance: (cm) 8 Undermining: No Wound Description Classification: Category/Stage IV Wound Margin: Well defined, not attached Exudate Amount: Large Exudate Type: Serosanguineous  Exudate Color: red, brown Foul Odor After Cleansing: No Slough/Fibrino No Wound Bed Granulation Amount: Medium (34-66%) Exposed Structure Granulation Quality: Red Fascia Exposed: No Necrotic Amount: Medium (34-66%) Fat Layer (Subcutaneous Tissue) Exposed: Yes Necrotic  Quality: Eschar, Adherent Slough Tendon Exposed: No Muscle Exposed: No Joint Exposed: No Bone Exposed: No Periwound Skin Texture Texture Color No Abnormalities Noted: Yes No Abnormalities Noted: Yes Moisture Temperature / Pain No Abnormalities Noted: Yes Temperature: No Abnormality Tenderness on Palpation: Yes Electronic Signature(s) Signed: 09/07/2022 10:43:20 AM By: Fredirick Maudlin MD FACS Signed: 09/07/2022 4:03:34 PM By: Baruch Gouty RN, BSN Entered By: Fredirick Maudlin on 09/07/2022 10:43:20 -------------------------------------------------------------------------------- Wound Assessment Details Patient Name: Date of Service: [name]. 09/07/2022 9:15 A M Medical Record Number: [id number] Patient Account Number: [id number] Date of Birth/Sex: Treating RN: [birth year] (72 y.o. [name] Primary Care [place] Talcott: Leretha Pol Other Clinician: Referring Roniesha Hollingshead: Treating [place] Catarino/Extender: Olin Pia in Treatment: Cooper Wound Status Wound Number: 2 Primary Pressure Ulcer Etiology: Wound Location: Right, Distal Gluteus Wound Open Wounding Event: Pressure Injury Status: Date Acquired: 08/11/2022 Comorbid Chronic sinus problems/congestion, Sleep Apnea, Congestive Heart Weeks Of Treatment: Cooper History: Failure, Raynauds, Rheumatoid Arthritis, Neuropathy [name], [name] ([id number]) [id number] Page 6 of 7 History: Failure, Raynauds, Rheumatoid Arthritis, Neuropathy Clustered Wound: No Photos Wound Measurements Length: (cm) Cooper.7 Width: (cm) 1 Depth: (cm) Cooper.1 Area: (cm) Cooper.55 Volume: (cm) Cooper.055 % Reduction in Area: % Reduction in Volume: Epithelialization: None Tunneling: No Undermining: No Wound Description Classification: Category/Stage III Wound Margin: Distinct, outline attached Exudate Amount: Medium Exudate Type: Serosanguineous Exudate Color: red, brown Foul Odor After Cleansing:  No Slough/Fibrino Yes Wound Bed Granulation Amount: None Present (Cooper%) Exposed Structure Necrotic Amount: Large (67-100%) Fascia Exposed: No Necrotic Quality: Adherent Slough Fat Layer (Subcutaneous Tissue) Exposed: Yes Tendon Exposed: No Muscle Exposed: No Joint Exposed: No Bone Exposed: No Periwound Skin Texture Texture Color No Abnormalities Noted: Yes No Abnormalities Noted: Yes Moisture Temperature / Pain No Abnormalities Noted: Yes Tenderness on Palpation: Yes Electronic Signature(s) Signed: 09/07/2022 4:03:34 PM By: Baruch Gouty RN, BSN Entered By: Baruch Gouty on 09/07/2022 10:03:56 -------------------------------------------------------------------------------- [place] Details Patient Name: Date of Service: [name]. 09/07/2022 9:15 A M Medical Record Number: [id number] Patient Account Number: [id number] Date of Birth/Sex: Treating RN: [birth year] (72 y.o. F) Primary Care Romon Devereux: Leretha Pol Other Clinician: Referring Shyanna Klingel: Treating Gretel Cantu/Extender: Olin Pia in Treatment: Cooper Vital Signs Time Taken: 09:23 Temperature (F): 97.4 Height (in): 62 Pulse (bpm): 81 Weight (lbs): 207 Respiratory Rate (breaths/min): 20 Body Mass Index (BMI): 37.9 Blood Pressure (mmHg): 114/69 Reference Range: 80 - 120 mg / dl INFANTOF, VILLAGOMEZ ([id number]) [id number] Page 7 of 7 Electronic Signature(s) Signed: 09/07/2022 10:01:21 AM By: Worthy Rancher Entered By: Worthy Rancher on 09/07/2022 09:24:26

## 2022-09-10 DIAGNOSIS — L89324 Pressure ulcer of left buttock, stage 4: Secondary | ICD-10-CM | POA: Insufficient documentation

## 2022-09-10 DIAGNOSIS — M79604 Pain in right leg: Secondary | ICD-10-CM | POA: Insufficient documentation

## 2022-09-10 DIAGNOSIS — L89322 Pressure ulcer of left buttock, stage 2: Secondary | ICD-10-CM | POA: Insufficient documentation

## 2022-09-11 DIAGNOSIS — S72451D Displaced supracondylar fracture without intracondylar extension of lower end of right femur, subsequent encounter for closed fracture with routine healing: Secondary | ICD-10-CM | POA: Diagnosis not present

## 2022-09-12 ENCOUNTER — Other Ambulatory Visit: Payer: Self-pay | Admitting: Physician Assistant

## 2022-09-12 DIAGNOSIS — K219 Gastro-esophageal reflux disease without esophagitis: Secondary | ICD-10-CM | POA: Diagnosis not present

## 2022-09-12 DIAGNOSIS — M3500 Sicca syndrome, unspecified: Secondary | ICD-10-CM | POA: Diagnosis not present

## 2022-09-12 DIAGNOSIS — M6281 Muscle weakness (generalized): Secondary | ICD-10-CM | POA: Diagnosis not present

## 2022-09-12 DIAGNOSIS — S72401D Unspecified fracture of lower end of right femur, subsequent encounter for closed fracture with routine healing: Secondary | ICD-10-CM | POA: Diagnosis not present

## 2022-09-12 DIAGNOSIS — G4733 Obstructive sleep apnea (adult) (pediatric): Secondary | ICD-10-CM | POA: Diagnosis not present

## 2022-09-12 DIAGNOSIS — M9701XD Periprosthetic fracture around internal prosthetic right hip joint, subsequent encounter: Secondary | ICD-10-CM | POA: Diagnosis not present

## 2022-09-12 DIAGNOSIS — I5022 Chronic systolic (congestive) heart failure: Secondary | ICD-10-CM | POA: Diagnosis not present

## 2022-09-12 DIAGNOSIS — M069 Rheumatoid arthritis, unspecified: Secondary | ICD-10-CM | POA: Diagnosis not present

## 2022-09-12 DIAGNOSIS — E1122 Type 2 diabetes mellitus with diabetic chronic kidney disease: Secondary | ICD-10-CM | POA: Diagnosis not present

## 2022-09-12 DIAGNOSIS — G8929 Other chronic pain: Secondary | ICD-10-CM | POA: Diagnosis not present

## 2022-09-12 DIAGNOSIS — M797 Fibromyalgia: Secondary | ICD-10-CM | POA: Diagnosis not present

## 2022-09-12 DIAGNOSIS — I130 Hypertensive heart and chronic kidney disease with heart failure and stage 1 through stage 4 chronic kidney disease, or unspecified chronic kidney disease: Secondary | ICD-10-CM | POA: Diagnosis not present

## 2022-09-12 DIAGNOSIS — Z7982 Long term (current) use of aspirin: Secondary | ICD-10-CM | POA: Diagnosis not present

## 2022-09-12 DIAGNOSIS — Z9181 History of falling: Secondary | ICD-10-CM | POA: Diagnosis not present

## 2022-09-12 DIAGNOSIS — R2689 Other abnormalities of gait and mobility: Secondary | ICD-10-CM | POA: Diagnosis not present

## 2022-09-12 DIAGNOSIS — M80051D Age-related osteoporosis with current pathological fracture, right femur, subsequent encounter for fracture with routine healing: Secondary | ICD-10-CM | POA: Diagnosis not present

## 2022-09-12 DIAGNOSIS — M545 Low back pain, unspecified: Secondary | ICD-10-CM | POA: Diagnosis not present

## 2022-09-12 DIAGNOSIS — I5032 Chronic diastolic (congestive) heart failure: Secondary | ICD-10-CM | POA: Diagnosis not present

## 2022-09-12 DIAGNOSIS — J329 Chronic sinusitis, unspecified: Secondary | ICD-10-CM | POA: Diagnosis not present

## 2022-09-12 DIAGNOSIS — N1832 Chronic kidney disease, stage 3b: Secondary | ICD-10-CM | POA: Diagnosis not present

## 2022-09-12 NOTE — Telephone Encounter (Signed)
Next Visit: 11/28/2022  Last Visit: 07/25/2022  Last Fill: 03/01/2022  DX: High risk medication use   Current Dose per office note on 07/25/2022: not discussed.   Okay to refill phenergan?

## 2022-09-13 DIAGNOSIS — R7302 Impaired glucose tolerance (oral): Secondary | ICD-10-CM | POA: Diagnosis not present

## 2022-09-13 DIAGNOSIS — L89314 Pressure ulcer of right buttock, stage 4: Secondary | ICD-10-CM | POA: Diagnosis not present

## 2022-09-13 DIAGNOSIS — N1831 Chronic kidney disease, stage 3a: Secondary | ICD-10-CM | POA: Diagnosis not present

## 2022-09-13 DIAGNOSIS — I5032 Chronic diastolic (congestive) heart failure: Secondary | ICD-10-CM | POA: Diagnosis not present

## 2022-09-13 DIAGNOSIS — E876 Hypokalemia: Secondary | ICD-10-CM | POA: Diagnosis not present

## 2022-09-13 DIAGNOSIS — L89313 Pressure ulcer of right buttock, stage 3: Secondary | ICD-10-CM | POA: Diagnosis not present

## 2022-09-13 DIAGNOSIS — N1832 Chronic kidney disease, stage 3b: Secondary | ICD-10-CM | POA: Diagnosis not present

## 2022-09-14 DIAGNOSIS — Z9181 History of falling: Secondary | ICD-10-CM | POA: Diagnosis not present

## 2022-09-14 DIAGNOSIS — M797 Fibromyalgia: Secondary | ICD-10-CM | POA: Diagnosis not present

## 2022-09-14 DIAGNOSIS — M3500 Sicca syndrome, unspecified: Secondary | ICD-10-CM | POA: Diagnosis not present

## 2022-09-14 DIAGNOSIS — G8929 Other chronic pain: Secondary | ICD-10-CM | POA: Diagnosis not present

## 2022-09-14 DIAGNOSIS — I130 Hypertensive heart and chronic kidney disease with heart failure and stage 1 through stage 4 chronic kidney disease, or unspecified chronic kidney disease: Secondary | ICD-10-CM | POA: Diagnosis not present

## 2022-09-14 DIAGNOSIS — G4733 Obstructive sleep apnea (adult) (pediatric): Secondary | ICD-10-CM | POA: Diagnosis not present

## 2022-09-14 DIAGNOSIS — K219 Gastro-esophageal reflux disease without esophagitis: Secondary | ICD-10-CM | POA: Diagnosis not present

## 2022-09-14 DIAGNOSIS — E1122 Type 2 diabetes mellitus with diabetic chronic kidney disease: Secondary | ICD-10-CM | POA: Diagnosis not present

## 2022-09-14 DIAGNOSIS — M545 Low back pain, unspecified: Secondary | ICD-10-CM | POA: Diagnosis not present

## 2022-09-14 DIAGNOSIS — J329 Chronic sinusitis, unspecified: Secondary | ICD-10-CM | POA: Diagnosis not present

## 2022-09-14 DIAGNOSIS — N1832 Chronic kidney disease, stage 3b: Secondary | ICD-10-CM | POA: Diagnosis not present

## 2022-09-14 DIAGNOSIS — M80051D Age-related osteoporosis with current pathological fracture, right femur, subsequent encounter for fracture with routine healing: Secondary | ICD-10-CM | POA: Diagnosis not present

## 2022-09-14 DIAGNOSIS — M069 Rheumatoid arthritis, unspecified: Secondary | ICD-10-CM | POA: Diagnosis not present

## 2022-09-14 DIAGNOSIS — Z7982 Long term (current) use of aspirin: Secondary | ICD-10-CM | POA: Diagnosis not present

## 2022-09-14 DIAGNOSIS — I5032 Chronic diastolic (congestive) heart failure: Secondary | ICD-10-CM | POA: Diagnosis not present

## 2022-09-14 DIAGNOSIS — M9701XD Periprosthetic fracture around internal prosthetic right hip joint, subsequent encounter: Secondary | ICD-10-CM | POA: Diagnosis not present

## 2022-09-15 ENCOUNTER — Encounter (HOSPITAL_BASED_OUTPATIENT_CLINIC_OR_DEPARTMENT_OTHER): Payer: PPO | Attending: General Surgery | Admitting: General Surgery

## 2022-09-15 DIAGNOSIS — L89314 Pressure ulcer of right buttock, stage 4: Secondary | ICD-10-CM | POA: Diagnosis not present

## 2022-09-15 DIAGNOSIS — E669 Obesity, unspecified: Secondary | ICD-10-CM | POA: Insufficient documentation

## 2022-09-15 DIAGNOSIS — M069 Rheumatoid arthritis, unspecified: Secondary | ICD-10-CM | POA: Diagnosis not present

## 2022-09-15 DIAGNOSIS — Z7985 Long-term (current) use of injectable non-insulin antidiabetic drugs: Secondary | ICD-10-CM | POA: Insufficient documentation

## 2022-09-15 DIAGNOSIS — R7302 Impaired glucose tolerance (oral): Secondary | ICD-10-CM | POA: Insufficient documentation

## 2022-09-15 DIAGNOSIS — Z6837 Body mass index (BMI) 37.0-37.9, adult: Secondary | ICD-10-CM | POA: Diagnosis not present

## 2022-09-15 DIAGNOSIS — N1832 Chronic kidney disease, stage 3b: Secondary | ICD-10-CM | POA: Insufficient documentation

## 2022-09-15 DIAGNOSIS — I5032 Chronic diastolic (congestive) heart failure: Secondary | ICD-10-CM | POA: Insufficient documentation

## 2022-09-15 DIAGNOSIS — L89313 Pressure ulcer of right buttock, stage 3: Secondary | ICD-10-CM | POA: Diagnosis not present

## 2022-09-15 DIAGNOSIS — I7300 Raynaud's syndrome without gangrene: Secondary | ICD-10-CM | POA: Insufficient documentation

## 2022-09-15 NOTE — Progress Notes (Signed)
[name] ([id number]) [id number] Page 1 of 10 Visit Report for 09/15/2022 Chief Complaint Document Details Patient Name: Date of Service: [name], [name] 09/15/2022 2:00 PM Medical Record Number: [id number] Patient Account Number: [id number] Date of Birth/Sex: Treating RN: February 22, 1951 (72 y.o. F) Primary Care Provider: Leretha Pol Other Clinician: Referring Provider: Treating Provider/Extender: Olin Pia in Treatment: 1 Information Obtained from: Patient Chief Complaint Patient is at the clinic for treatment of open pressure ulcers Electronic Signature(s) Signed: 09/15/2022 2:41:09 PM By: Fredirick Maudlin MD FACS Entered By: Fredirick Maudlin on 09/15/2022 14:41:09 -------------------------------------------------------------------------------- Debridement Details Patient Name: Date of Service: [name]. 09/15/2022 2:00 PM Medical Record Number: [id number] Patient Account Number: [id number] Date of Birth/Sex: Treating RN: May 22, 1951 (72 y.o. [name] Primary Care Provider: Leretha Pol Other Clinician: Referring Provider: Treating Provider/Extender: Olin Pia in Treatment: 1 Debridement Performed for Assessment: Wound #1 Right Gluteus Performed By: Physician Fredirick Maudlin, MD Debridement Type: Debridement Level of Consciousness (Pre-procedure): Awake and Alert Pre-procedure Verification/Time Out Yes - 14:32 Taken: Start Time: 14:33 Pain Control: Lidocaine 5% topical ointment T Area Debrided (L x W): otal 4.2 (cm) x 4.2 (cm) = 17.64 (cm) Tissue and other material debrided: Non-Viable, [place] Level: Non-Viable Tissue Debridement Description: Selective/Open Wound Instrument: Curette Bleeding: Minimum Hemostasis Achieved: Pressure Response to Treatment: Procedure was tolerated well Level of Consciousness (Post- Awake and Alert procedure): Post Debridement  Measurements of Total Wound Length: (cm) 4.2 Stage: Category/Stage IV Width: (cm) 4.2 Depth: (cm) 7.5 Volume: (cm) 103.908 Character of Wound/Ulcer Post Debridement: Requires Further Debridement Post Procedure Diagnosis Same as [name] ([id number]) [id number] Page 2 of 10 Notes Scribed for Dr. Celine Cooper by Marisa East, RN Electronic Signature(s) Signed: 09/15/2022 2:52:04 PM By: Fredirick Maudlin MD FACS Signed: 09/15/2022 5:05:53 PM By: Marisa East RN Entered By: [name] on 09/15/2022 14:34:51 -------------------------------------------------------------------------------- Debridement Details Patient Name: Date of Service: [name]. 09/15/2022 2:00 PM Medical Record Number: [id number] Patient Account Number: [id number] Date of Birth/Sex: Treating RN: 03-12-1951 (72 y.o. [name] Primary Care Provider: Leretha Pol Other Clinician: Referring Provider: Treating Provider/Extender: Olin Pia in Treatment: 1 Debridement Performed for Assessment: Wound #2 Right,Distal Gluteus Performed By: Physician Fredirick Maudlin, MD Debridement Type: Debridement Level of Consciousness (Pre-procedure): Awake and Alert Pre-procedure Verification/Time Out Yes - 14:32 Taken: Start Time: 14:33 Pain Control: Lidocaine 5% topical ointment T Area Debrided (L x W): otal 0.9 (cm) x 0.9 (cm) = 0.81 (cm) Tissue and other material debrided: Non-Viable, Slough, Slough Level: Non-Viable Tissue Debridement Description: Selective/Open Wound Instrument: Curette Bleeding: Minimum Hemostasis Achieved: Pressure Response to Treatment: Procedure was tolerated well Level of Consciousness (Post- Awake and Alert procedure): Post Debridement Measurements of Total Wound Length: (cm) 0.9 Stage: Category/Stage III Width: (cm) 0.9 Depth: (cm) 0.1 Volume: (cm) 0.064 Character of Wound/Ulcer Post Debridement: Requires  Further Debridement Post Procedure Diagnosis Same as Pre-procedure Notes Scribed for Dr. Celine Cooper by Marisa East, RN Electronic Signature(s) Signed: 09/15/2022 2:52:04 PM By: Fredirick Maudlin MD FACS Signed: 09/15/2022 5:05:53 PM By: Marisa East RN Entered By: [name] on 09/15/2022 14:35:44 -------------------------------------------------------------------------------- HPI Details Patient Name: Date of Service: Marisa Ar Cooper. 09/15/2022 2:00 PM Medical Record Number: [id number] Patient Account Number: [id number] Date of Birth/Sex: Treating RN: [birth year] (72 y.o. F) Primary Care Provider: Leretha Pol Other Clinician: HONG, TIMM ([id number]) [id number] Page 3 of 10 Referring Provider: Treating Provider/Extender: Olin Pia in Treatment:  1 History of Present Illness HPI Description: ADMISSION 09/07/2023 This is a 72 year old woman with a past medical history notable for obesity, congestive heart failure, Raynaud's syndrome, CKD stage IIIb, osteoporosis, and rheumatoid arthritis. In November 2023, she suffered a fall that resulted in a femur fracture. She was hospitalized for about a week and underwent surgical repair of the fracture. She subsequently developed pressure ulcers on her right buttocks and ischium. She has been receiving home health services and they have been applying Medihoney. They have been reporting the wounds as stage II, but on evaluation, the large ulcer was probably unstageable at the time they were evaluating it but it is clearly a stage IV at this point. The smaller ulcer does have fat layer exposure and therefore is a stage III. The patient is accompanied by her daughter. She says she has been sleeping on a regular bed but recently ordered an air mattress T opper, but does not have it yet. She is on Ozempic for weight loss and therefore has a poor appetite and struggles to get adequate protein  intake. 09/15/2022: The large stage IV ulcer is substantially cleaner this week. The stage III ulcer is a little smaller. Both have slough accumulation. The culture that I took last week was polymicrobial. The Augmentin that I prescribed was adequate coverage for the species and she continues to take this. We have also ordered Keystone topical antibiotic compound, but this has not yet arrived. Electronic Signature(s) Signed: 09/15/2022 2:42:09 PM By: Fredirick Maudlin MD FACS Entered By: Fredirick Maudlin on 09/15/2022 14:42:09 -------------------------------------------------------------------------------- Physical Exam Details Patient Name: Date of Service: [name] 09/15/2022 2:00 PM Medical Record Number: [id number] Patient Account Number: [id number] Date of Birth/Sex: Treating RN: 05/07/1951 (72 y.o. F) Primary Care Provider: Leretha Pol Other Clinician: Referring Provider: Treating Provider/Extender: Olin Pia in Treatment: 1 Constitutional . Slightly tachycardic. . . no acute distress. Respiratory Normal work of breathing on room air. Notes 09/15/2022: The large stage IV ulcer is substantially cleaner this week. The stage III ulcer is a little smaller. Both have slough accumulation. Electronic Signature(s) Signed: 09/15/2022 2:44:10 PM By: Fredirick Maudlin MD FACS Entered By: Fredirick Maudlin on 09/15/2022 14:44:10 -------------------------------------------------------------------------------- Physician Orders Details Patient Name: Date of Service: Marisa Ar Cooper. 09/15/2022 2:00 PM Medical Record Number: [id number] Patient Account Number: [id number] Date of Birth/Sex: Treating RN: 02/03/1951 (72 y.o. [name] Primary Care Provider: Leretha Pol Other Clinician: Referring Provider: Treating Provider/Extender: Olin Pia in Treatment: 1 Verbal / Phone Orders: No Diagnosis Coding Follow-up  Appointments AZARIYA, FREEMAN ([id number]) [id number] Page 4 of 10 ppointment in 1 week. - Dr. Celine Cooper RM 1 Return A Friday 2/2 at 2:00 pm Anesthetic Wound #1 Right Gluteus (In clinic) Topical Lidocaine 5% applied to wound bed Wound #2 Right,Distal Gluteus (In clinic) Topical Lidocaine 5% applied to wound bed Bathing/ Shower/ Hygiene May shower and wash wound with soap and water. Off-Loading Low air-loss mattress (Group 2) - ordered from [employer] needs hospital bed Turn and reposition every 2 hours - avoid lying on back, stand at least every hour while out of bed Additional Orders / Instructions Follow Nutritious Diet - increase protein to 70-80 gms per day, hold ozempic Home Health New wound care orders this week; continue Home Health for wound care. May utilize formulary equivalent dressing for wound treatment orders unless otherwise specified. - see orders Other Home Health Orders/Instructions: - [facility] Wound Treatment Wound #1 - Gluteus Wound Laterality:  Right Cleanser: Wound Cleanser 1 x Per Day/30 Days Discharge Instructions: Cleanse the wound with wound cleanser prior to applying a clean dressing using gauze sponges, not tissue or cotton balls. Prim Dressing: Dakin's Solution 0.25%, 16 (oz) 1 x Per Day/30 Days ary Discharge Instructions: Moisten gauze with Dakin's solution and pack into wound Secondary Dressing: Zetuvit Plus Silicone Border Sacrum Dressing, Sm, 7x7 (in/in) 1 x Per Day/30 Days Discharge Instructions: Apply silicone border over primary dressing as directed. Wound #2 - Gluteus Wound Laterality: Right, Distal Prim Dressing: Sorbalgon AG Dressing 2x2 (in/in) 1 x Per Day/30 Days ary Discharge Instructions: Apply to wound bed as instructed Secondary Dressing: Zetuvit Plus Silicone Border Dressing 4x4 (in/in) 1 x Per Day/30 Days Discharge Instructions: Apply silicone border over primary dressing as directed. Electronic  Signature(s) Signed: 09/15/2022 2:52:04 PM By: Fredirick Maudlin MD FACS Entered By: Fredirick Maudlin on 09/15/2022 14:44:30 -------------------------------------------------------------------------------- Problem List Details Patient Name: Date of Service: [name]. 09/15/2022 2:00 PM Medical Record Number: [id number] Patient Account Number: [id number] Date of Birth/Sex: Treating RN: Jun 04, 1951 (72 y.o. F) Primary Care Provider: Leretha Pol Other Clinician: Referring Provider: Treating Provider/Extender: Olin Pia in Treatment: 1 Active Problems ICD-10 Encounter Code Description Active Date MDM Diagnosis L89.314 Pressure ulcer of right buttock, stage 4 09/07/2022 No Yes LAURISSA, COWPER ([id number]) [id number] Page 5 of 10 L89.313 Pressure ulcer of right buttock, stage 3 09/07/2022 No Yes I73.00 Raynaud's syndrome without gangrene 09/07/2022 No Yes J28.78 Chronic diastolic (congestive) heart failure 09/07/2022 No Yes N18.32 Chronic kidney disease, stage 3b 09/07/2022 No Yes R73.02 Impaired glucose tolerance (oral) 09/07/2022 No Yes E66.9 Obesity, unspecified 09/07/2022 No Yes Inactive Problems Resolved Problems Electronic Signature(s) Signed: 09/15/2022 2:40:51 PM By: Fredirick Maudlin MD FACS Entered By: Fredirick Maudlin on 09/15/2022 14:40:50 -------------------------------------------------------------------------------- Progress Note Details Patient Name: Date of Service: Marisa Verda Cumins. 09/15/2022 2:00 PM Medical Record Number: [id number] Patient Account Number: [id number] Date of Birth/Sex: Treating RN: 02-09-1951 (72 y.o. F) Primary Care Provider: Leretha Pol Other Clinician: Referring Provider: Treating Provider/Extender: Olin Pia in Treatment: 1 Subjective Chief Complaint Information obtained from Patient Patient is at the clinic for treatment of open pressure ulcers History of  Present Illness (HPI) ADMISSION 09/07/2023 This is a 72 year old woman with a past medical history notable for obesity, congestive heart failure, Raynaud's syndrome, CKD stage IIIb, osteoporosis, and rheumatoid arthritis. In November 2023, she suffered a fall that resulted in a femur fracture. She was hospitalized for about a week and underwent surgical repair of the fracture. She subsequently developed pressure ulcers on her right buttocks and ischium. She has been receiving home health services and they have been applying Medihoney. They have been reporting the wounds as stage II, but on evaluation, the large ulcer was probably unstageable at the time they were evaluating it but it is clearly a stage IV at this point. The smaller ulcer does have fat layer exposure and therefore is a stage III. The patient is accompanied by her daughter. She says she has been sleeping on a regular bed but recently ordered an air mattress T opper, but does not have it yet. She is on Ozempic for weight loss and therefore has a poor appetite and struggles to get adequate protein intake. 09/15/2022: The large stage IV ulcer is substantially cleaner this week. The stage III ulcer is a little smaller. Both have slough accumulation. The culture that I took last week was polymicrobial. The Augmentin that I prescribed was  adequate coverage for the species and she continues to take this. We have also ordered Keystone topical antibiotic compound, but this has not yet arrived. Patient History Information obtained from Patient, Caregiver, Chart. Family History Cancer - Father,Paternal Grandparents, Heart Disease - Mother,Father,Paternal Grandparents, Hypertension - Mother, Stroke - Maternal Grandparents, No family history of Diabetes, Hereditary Spherocytosis, Kidney Disease, Lung Disease, Seizures, Thyroid Problems, Tuberculosis. [name], [name] ([id number]) [id number] Page 6 of 10 Social  History Never smoker, Marital Status - Widowed, Alcohol Use - Rarely, Drug Use - Prior History - TCH, Caffeine Use - Daily - soda, tea. Medical History Eyes Denies history of Cataracts, Glaucoma, Optic Neuritis Ear/Nose/Mouth/Throat Patient has history of Chronic sinus problems/congestion - chronic rhinitis Denies history of Middle ear problems Respiratory Patient has history of Sleep Apnea - uses CPAP Cardiovascular Patient has history of Congestive Heart Failure Endocrine Denies history of Type I Diabetes, Type II Diabetes Genitourinary Denies history of End Stage Renal Disease Immunological Patient has history of Raynaudoos Denies history of Lupus Erythematosus, Scleroderma Integumentary (Skin) Denies history of History of Burn Musculoskeletal Patient has history of Rheumatoid Arthritis Denies history of Gout, Osteoarthritis, Osteomyelitis Neurologic Patient has history of Neuropathy Denies history of Dementia, Quadriplegia, Paraplegia, Seizure Disorder Oncologic Denies history of Received Chemotherapy, Received Radiation Psychiatric Denies history of Anorexia/bulimia, Confinement Anxiety Hospitalization/Surgery History - right femur fx ORIR. - right knee replacement. - left breast mass excision. - left ankle tibia repair. - abdominal hysterectomy. - adnoidectomy/tonsillectomy. - cholecystectomy. - dental implants. - patoid cystectomy. - tubal ligation. Medical A Surgical History Notes nd Constitutional Symptoms (General Health) morbid obesity Cardiovascular hyperlipidemia Gastrointestinal GERD, IBS, eosinophilic esophagitis, diverticulosis, Genitourinary CKD stage 3 Immunological Sjoegren syndrome, fibromyalgia Objective Constitutional Slightly tachycardic. no acute distress. Vitals Time Taken: 2:13 PM, Height: 62 in, Weight: 207 lbs, BMI: 37.9, Temperature: 98.2 F, Pulse: 101 bpm, Respiratory Rate: 18 breaths/min, Blood Pressure: 127/73  mmHg. Respiratory Normal work of breathing on room air. General Notes: 09/15/2022: The large stage IV ulcer is substantially cleaner this week. The stage III ulcer is a little smaller. Both have slough accumulation. Integumentary (Hair, Skin) Wound #1 status is Open. Original cause of wound was Pressure Injury. The date acquired was: 08/11/2022. The wound has been in treatment 1 weeks. The wound is located on the Right Gluteus. The wound measures 4.2cm length x 4.2cm width x 7.5cm depth; 13.854cm^2 area and 103.908cm^3 volume. There is Fat Layer (Subcutaneous Tissue) exposed. There is no undermining noted, however, there is tunneling at 12:00 with a maximum distance of 8.5cm. There is a large amount of serosanguineous drainage noted. The wound margin is well defined and not attached to the wound base. There is medium (34-66%) red granulation within the wound bed. There is a medium (34-66%) amount of necrotic tissue within the wound bed including Eschar and Adherent Slough. The periwound skin appearance had no abnormalities noted for texture. The periwound skin appearance had no abnormalities noted for moisture. The periwound skin appearance had no abnormalities noted for color. Periwound temperature was noted as No Abnormality. The periwound has tenderness on palpation. Wound #2 status is Open. Original cause of wound was Pressure Injury. The date acquired was: 08/11/2022. The wound has been in treatment 1 weeks. The wound is located on the Right,Distal Gluteus. The wound measures 0.9cm length x 0.9cm width x 0.1cm depth; 0.636cm^2 area and 0.064cm^3 volume. There is Fat Layer (Subcutaneous Tissue) exposed. There is no tunneling or undermining noted. There is a medium amount of serosanguineous drainage  noted. The wound margin is distinct with the outline attached to the wound base. There is small (1-33%) pale granulation within the wound bed. There is a large (67-100%) amount of necrotic tissue within  the wound bed including Adherent Slough. The periwound skin appearance had no abnormalities noted for texture. The periwound skin appearance had no abnormalities noted for moisture. The periwound skin appearance had no abnormalities noted for color. The periwound has tenderness on palpation. [name], [name] ([id number]) [id number] Page 7 of 10 Assessment Active Problems ICD-10 Pressure ulcer of right buttock, stage 4 Pressure ulcer of right buttock, stage 3 Raynaud's syndrome without gangrene Chronic diastolic (congestive) heart failure Chronic kidney disease, stage 3b Impaired glucose tolerance (oral) Obesity, unspecified Procedures Wound #1 Pre-procedure diagnosis of Wound #1 is a Pressure Ulcer located on the Right Gluteus . There was a Selective/Open Wound Non-Viable Tissue Debridement with a total area of 17.64 sq cm performed by Fredirick Maudlin, MD. With the following instrument(s): Curette to remove Non-Viable tissue/material. Material removed includes [facility] after achieving pain control using Lidocaine 5% topical ointment. No specimens were taken. A time out was conducted at 14:32, prior to the start of the procedure. A Minimum amount of bleeding was controlled with Pressure. The procedure was tolerated well. Post Debridement Measurements: 4.2cm length x 4.2cm width x 7.5cm depth; 103.908cm^3 volume. Post debridement Stage noted as Category/Stage IV. Character of Wound/Ulcer Post Debridement requires further debridement. Post procedure Diagnosis Wound #1: Same as Pre-Procedure General Notes: Scribed for Dr. Celine Cooper by Marisa East, RN. Wound #2 Pre-procedure diagnosis of Wound #2 is a Pressure Ulcer located on the Right,Distal Gluteus . There was a Selective/Open Wound Non-Viable Tissue Debridement with a total area of 0.81 sq cm performed by Fredirick Maudlin, MD. With the following instrument(s): Curette to remove Non-Viable tissue/material. Material  removed includes [facility] after achieving pain control using Lidocaine 5% topical ointment. No specimens were taken. A time out was conducted at 14:32, prior to the start of the procedure. A Minimum amount of bleeding was controlled with Pressure. The procedure was tolerated well. Post Debridement Measurements: 0.9cm length x 0.9cm width x 0.1cm depth; 0.064cm^3 volume. Post debridement Stage noted as Category/Stage III. Character of Wound/Ulcer Post Debridement requires further debridement. Post procedure Diagnosis Wound #2: Same as Pre-Procedure General Notes: Scribed for Dr. Celine Cooper by Marisa East, RN. Plan Follow-up Appointments: Return Appointment in 1 week. - Dr. Celine Cooper RM 1 Friday 2/2 at 2:00 pm Anesthetic: Wound #1 Right Gluteus: (In clinic) Topical Lidocaine 5% applied to wound bed Wound #2 Right,Distal Gluteus: (In clinic) Topical Lidocaine 5% applied to wound bed Bathing/ Shower/ Hygiene: May shower and wash wound with soap and water. Off-Loading: Low air-loss mattress (Group 2) - ordered from [employer] needs hospital bed Turn and reposition every 2 hours - avoid lying on back, stand at least every hour while out of bed Additional Orders / Instructions: Follow Nutritious Diet - increase protein to 70-80 gms per day, hold ozempic Home Health: New wound care orders this week; continue Home Health for wound care. May utilize formulary equivalent dressing for wound treatment orders unless otherwise specified. - see orders Other Home Health Orders/Instructions: - [facility] WOUND #1: - Gluteus Wound Laterality: Right Cleanser: Wound Cleanser 1 x Per Day/30 Days Discharge Instructions: Cleanse the wound with wound cleanser prior to applying a clean dressing using gauze sponges, not tissue or cotton balls. Prim Dressing: Dakin's Solution 0.25%, 16 (oz) 1 x Per Day/30 Days ary Discharge Instructions: Moisten gauze with  Dakin's solution and pack into wound Secondary Dressing:  Zetuvit Plus Silicone Border Sacrum Dressing, Sm, 7x7 (in/in) 1 x Per Day/30 Days Discharge Instructions: Apply silicone border over primary dressing as directed. WOUND #2: - Gluteus Wound Laterality: Right, Distal Prim Dressing: Sorbalgon AG Dressing 2x2 (in/in) 1 x Per Day/30 Days ary Discharge Instructions: Apply to wound bed as instructed Secondary Dressing: Zetuvit Plus Silicone Border Dressing 4x4 (in/in) 1 x Per Day/30 Days Discharge Instructions: Apply silicone border over primary dressing as directed. 09/15/2022: The large stage IV ulcer is substantially cleaner this week. The stage III ulcer is a little smaller. Both have slough accumulation. I used a curette to debride slough from all of the wound surfaces. She will complete her course of oral Augmentin. Once the [facility] compound arrives, they may begin using it on the stage IV wound surface. We will continue to pack that wound with Dakin's moistened gauze. Once it is cleaner, I think she is a good candidate for wound VAC, but there is still too much nonviable surface present. Continue silver alginate to the stage III ulcer. Follow-up in 1 week. [name], [name] ([id number]) [id number] Page 8 of 10 Electronic Signature(s) Signed: 09/15/2022 2:50:34 PM By: Fredirick Maudlin MD FACS Previous Signature: 09/15/2022 2:44:39 PM Version By: Fredirick Maudlin MD FACS Entered By: Fredirick Maudlin on 09/15/2022 14:50:34 -------------------------------------------------------------------------------- Marisa Details Patient Name: Date of Service: Marisa Franchot Gallo Cooper. 09/15/2022 2:00 PM Medical Record Number: [id number] Patient Account Number: [id number] Date of Birth/Sex: Treating RN: Mar 11, 1951 (72 y.o. F) Primary Care Provider: Leretha Pol Other Clinician: Referring Provider: Treating Provider/Extender: Olin Pia in Treatment: 1 Information Obtained From Patient Caregiver  Chart Constitutional Symptoms (General Health) Medical History: Past Medical History Notes: morbid obesity Eyes Medical History: Negative for: Cataracts; Glaucoma; Optic Neuritis Ear/Nose/Mouth/Throat Medical History: Positive for: Chronic sinus problems/congestion - chronic rhinitis Negative for: Middle ear problems Respiratory Medical History: Positive for: Sleep Apnea - uses CPAP Cardiovascular Medical History: Positive for: Congestive Heart Failure Past Medical History Notes: hyperlipidemia Gastrointestinal Medical History: Past Medical History Notes: GERD, IBS, eosinophilic esophagitis, diverticulosis, Endocrine Medical History: Negative for: Type I Diabetes; Type II Diabetes Genitourinary Medical History: Negative for: End Stage Renal Disease Past Medical History Notes: CKD stage 3 Immunological Medical History: Positive forAHNESTI, TOWNSEND ([id number]) [id number] Page 9 of 10 Negative for: Lupus Erythematosus; Scleroderma Past Medical History Notes: Sjoegren syndrome, fibromyalgia Integumentary (Skin) Medical History: Negative for: History of Burn Musculoskeletal Medical History: Positive for: Rheumatoid Arthritis Negative for: Gout; Osteoarthritis; Osteomyelitis Neurologic Medical History: Positive for: Neuropathy Negative for: Dementia; Quadriplegia; Paraplegia; Seizure Disorder Oncologic Medical History: Negative for: Received Chemotherapy; Received Radiation Psychiatric Medical History: Negative for: Anorexia/bulimia; Confinement Anxiety HBO Extended History Items Ear/Nose/Mouth/Throat: Chronic sinus problems/congestion Immunizations Pneumococcal Vaccine: Received Pneumococcal Vaccination: Yes Received Pneumococcal Vaccination On or After 60th Birthday: Yes Implantable Devices None Hospitalization / Surgery History Type of Hospitalization/Surgery right femur fx ORIR right knee replacement left breast  mass excision left ankle tibia repair abdominal hysterectomy adnoidectomy/tonsillectomy cholecystectomy dental implants patoid cystectomy tubal ligation Family and Social History Cancer: Yes - Father,Paternal Grandparents; Diabetes: No; Heart Disease: Yes - Mother,Father,Paternal Grandparents; Hereditary Spherocytosis: No; Hypertension: Yes - Mother; Kidney Disease: No; Lung Disease: No; Seizures: No; Stroke: Yes - Maternal Grandparents; Thyroid Problems: No; Tuberculosis: No; Never smoker; Marital Status - Widowed; Alcohol Use: Rarely; Drug Use: Prior History - TCH; Caffeine Use: Daily - soda, tea; Financial Concerns: No; Food, Clothing or Shelter Needs: No; Support System Lacking:  No; Transportation Concerns: No Electronic Signature(s) Signed: 09/15/2022 2:52:04 PM By: Fredirick Maudlin MD FACS Entered By: Fredirick Maudlin on 09/15/2022 14:42:32 [name], [name] ([id number]) [id number] Page 10 of 10 -------------------------------------------------------------------------------- SuperBill Details Patient Name: Date of Service: [name], [name] 09/15/2022 Medical Record Number: [id number] Patient Account Number: [id number] Date of Birth/Sex: Treating RN: 1950/11/30 (72 y.o. 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) E66.9 Obesity, unspecified Facility Procedures : CPT4 Code: [id number] Description: 08811 - DEBRIDE WOUND 1ST 20 SQ CM OR < ICD-10 Diagnosis Description L89.314 Pressure ulcer of right buttock, stage 4 L89.313  Pressure ulcer of right buttock, stage 3 Modifier: Quantity: 1 Physician Procedures : CPT4 Code Description Modifier [id number] - WC PHYS LEVEL 4 - EST PT 25 ICD-10 Diagnosis Description L89.314 Pressure ulcer of right buttock, stage 4 L89.313 Pressure ulcer of right buttock, stage 3 E66.9 Obesity, unspecified W44.62 Chronic  diastolic (congestive) heart failure Quantity: 1 : [id number] - WC PHYS DEBR WO ANESTH 20 SQ CM ICD-10 Diagnosis Description L89.314 Pressure ulcer of right buttock, stage 4 L89.313 Pressure ulcer of right buttock, stage 3 Quantity: 1 Electronic Signature(s) Signed: 09/15/2022 2:50:57 PM By: Fredirick Maudlin MD FACS Entered By: Fredirick Maudlin on 09/15/2022 14:50:56

## 2022-09-15 NOTE — Progress Notes (Signed)
[name], [name] ([id number]) [phone].pdf Page 1 of 9 Visit Report for 09/15/2022 Arrival Information Details Patient Name: Date of Service: [name], [name] 09/15/2022 2:00 PM Medical Record Number: [id number] Patient Account Number: [id number] Date of Birth/Sex: Treating RN: 1950/09/26 (72 y.o. [name] Primary Care [name]: [name] Other Clinician: Referring [name]: Treating [name]/Extender: [name] in Treatment: 1 Visit Information History Since Last Visit Added or deleted any medications: No Patient Arrived: Wheel Chair Any new allergies or adverse reactions: No Arrival Time: 14:12 Had a fall or experienced change in No Accompanied By: daughter activities of daily living that may affect Transfer Assistance: Manual risk of falls: Patient Identification Verified: Yes Signs or symptoms of abuse/neglect since last visito No Secondary Verification Process Completed: Yes Hospitalized since last visit: No Patient Requires Transmission-Based Precautions: No Implantable device outside of the clinic excluding No Patient Has Alerts: No cellular tissue based products placed in the center since last visit: Has Dressing in Place as Prescribed: Yes Pain Present Now: Yes Electronic Signature(s) Signed: 09/15/2022 5:05:53 PM By: Marisa East RN Entered By: [name] on 09/15/2022 14:13:14 -------------------------------------------------------------------------------- Encounter Discharge Information Details Patient Name: Date of Service: [name]. 09/15/2022 2:00 PM Medical Record Number: [id number] Patient Account Number: [id number] Date of Birth/Sex: Treating RN: 01/07/1951 (72 y.o. [name] Primary Care Baker Kogler: [name] Other Clinician: Referring Kawana Hegel: Treating Siya Flurry/Extender: [name] in Treatment: 1 Encounter Discharge Information Items Post  Procedure Vitals Discharge Condition: Stable Temperature (F): 98.2 Ambulatory Status: Wheelchair Pulse (bpm): 101 Discharge Destination: Home Respiratory Rate (breaths/min): 18 Transportation: Private Auto Blood Pressure (mmHg): 124/73 Accompanied By: self Schedule Follow-up Appointment: Yes Clinical Summary of Care: Electronic Signature(s) Signed: 09/15/2022 5:05:53 PM By: Marisa East RN Entered By: [name] on 09/15/2022 14:50:46 [name] ([id number]) [id number] Page 2 of 9 -------------------------------------------------------------------------------- Lower Extremity Assessment Details Patient Name: Date of Service: [name], [name] 09/15/2022 2:00 PM Medical Record Number: [id number] Patient Account Number: [id number] Date of Birth/Sex: Treating RN: 03-07-1951 (72 y.o. [name] Primary Care Marvelle Caudill: [name] Other Clinician: Referring Stanislawa Gaffin: Treating Shinichi Anguiano/Extender: [name] in Treatment: 1 Electronic Signature(s) Signed: 09/15/2022 5:05:53 PM By: Marisa East RN Entered By: [name] on 09/15/2022 14:14:06 -------------------------------------------------------------------------------- Multi Wound Chart Details Patient Name: Date of Service: [name]. 09/15/2022 2:00 PM Medical Record Number: [id number] Patient Account Number: [id number] Date of Birth/Sex: Treating RN: 12-12-1950 (72 y.o. F) Primary Care Angeles Zehner: [name] Other Clinician: Referring Emie Sommerfeld: Treating Orli Degrave/Extender: [name] in Treatment: 1 Vital Signs Height(in): 62 Pulse(bpm): 101 Weight(lbs): 207 Blood Pressure(mmHg): 127/73 Body Mass Index(BMI): 37.9 Temperature(F): 98.2 Respiratory Rate(breaths/min): 18 [1:Photos:] [N/A:N/A] Right Gluteus Right, Distal Gluteus N/A Wound Location: Pressure Injury Pressure Injury N/A Wounding Event: Pressure Ulcer  Pressure Ulcer N/A Primary Etiology: Chronic sinus problems/congestion, Chronic sinus problems/congestion, N/A Comorbid History: Sleep Apnea, Congestive Heart Sleep Apnea, Congestive Heart Failure, Raynauds, Rheumatoid Failure, Raynauds, Rheumatoid Arthritis, Neuropathy Arthritis, Neuropathy 08/11/2022 08/11/2022 N/A Date Acquired: 1 1 N/A Weeks of Treatment: Open Open N/A Wound Status: No No N/A Wound Recurrence: 4.2x4.2x7.5 0.9x0.9x0.1 N/A Measurements L x W x D (cm) 13.854 0.636 N/A A (cm) : rea 103.908 0.064 N/A Volume (cm) : 37.00% -15.60% N/A % Reduction in A rea: 21.30% -16.40% N/A % Reduction in Volume: 12 Position 1 (o'clock): 8.5 Maximum Distance 1 (cm): Yes No N/A Tunneling: Category/Stage IV Category/Stage III N/A Classification: Large Medium N/A Exudate  A mount: Serosanguineous Serosanguineous N/A Exudate Type: [name] ([id number]) [id number] Page 3 of 9 red, brown red, brown N/A Exudate Color: Well defined, not attached Distinct, outline attached N/A Wound Margin: Medium (34-66%) Small (1-33%) N/A Granulation Amount: Red Pale N/A Granulation Quality: Medium (34-66%) Large (67-100%) N/A Necrotic Amount: Eschar, Adherent Slough Adherent Slough N/A Necrotic Tissue: Fat Layer (Subcutaneous Tissue): Yes Fat Layer (Subcutaneous Tissue): Yes N/A Exposed Structures: Fascia: No Fascia: No Tendon: No Tendon: No Muscle: No Muscle: No Joint: No Joint: No Bone: No Bone: No N/A None N/A Epithelialization: Debridement - Selective/Open Wound Debridement - Selective/Open Wound N/A Debridement: Pre-procedure Verification/Time Out 14:32 14:32 N/A Taken: Lidocaine 5% topical ointment Lidocaine 5% topical ointment N/A Pain Control: [employer] N/A Tissue Debrided: Non-Viable Tissue Non-Viable Tissue N/A Level: 17.64 0.81 N/A Debridement A (sq cm): rea Curette Curette N/A Instrument: Minimum Minimum  N/A Bleeding: Pressure Pressure N/A Hemostasis A chieved: Procedure was tolerated well Procedure was tolerated well N/A Debridement Treatment Response: 4.2x4.2x7.5 0.9x0.9x0.1 N/A Post Debridement Measurements L x W x D (cm) 103.908 0.064 N/A Post Debridement Volume: (cm) Category/Stage IV Category/Stage III N/A Post Debridement Stage: Excoriation: No Excoriation: No N/A Periwound Skin Texture: Induration: No Induration: No Callus: No Callus: No Crepitus: No Crepitus: No Rash: No Rash: No Scarring: No Scarring: No Maceration: No Maceration: No N/A Periwound Skin Moisture: Dry/Scaly: No Dry/Scaly: No Atrophie Marisa: No Atrophie Marisa: No N/A Periwound Skin Color: Cyanosis: No Cyanosis: No Ecchymosis: No Ecchymosis: No Erythema: No Erythema: No Hemosiderin Staining: No Hemosiderin Staining: No Mottled: No Mottled: No Pallor: No Pallor: No Rubor: No Rubor: No No Abnormality N/A N/A Temperature: Yes Yes N/A Tenderness on Palpation: Debridement Debridement N/A Procedures Performed: Treatment Notes Wound #1 (Gluteus) Wound Laterality: Right Cleanser Wound Cleanser Discharge Instruction: Cleanse the wound with wound cleanser prior to applying a clean dressing using gauze sponges, not tissue or cotton balls. Peri-Wound Care Topical Primary Dressing Dakin's Solution 0.25%, 16 (oz) Discharge Instruction: Moisten gauze with Dakin's solution and pack into wound Secondary Dressing Zetuvit Plus Silicone Border Sacrum Dressing, Sm, 7x7 (in/in) Discharge Instruction: Apply silicone border over primary dressing as directed. Secured With Compression Wrap Compression Stockings Add-Ons Wound #2 (Gluteus) Wound Laterality: Right, Distal Cleanser Peri-Wound Care Topical BREAN, CARBERRY ([id number]) [id number] Page 4 of 9 Primary Dressing Sorbalgon AG Dressing 2x2 (in/in) Discharge Instruction: Apply to wound bed as  instructed Secondary Dressing Zetuvit Plus Silicone Border Dressing 4x4 (in/in) Discharge Instruction: Apply silicone border over primary dressing as directed. Secured With Compression Wrap Compression Stockings Environmental education officer) Signed: 09/15/2022 2:41:02 PM By: Fredirick Maudlin MD FACS Entered By: Fredirick Maudlin on 09/15/2022 14:41:02 -------------------------------------------------------------------------------- Multi-Disciplinary Care Plan Details Patient Name: Date of Service: [name]. 09/15/2022 2:00 PM Medical Record Number: [id number] Patient Account Number: [id number] Date of Birth/Sex: Treating RN: Oct 04, 1950 (72 y.o. [name] Primary Care Taletha Twiford: [name] Other Clinician: Referring Kerstie Agent: Treating Jearlene Bridwell/Extender: [name] in Treatment: 1 Active Inactive Medication Nursing Diagnoses: Knowledge deficit related to medication safety: actual or potential Goals: Patient/caregiver will demonstrate understanding of all current medications Date Initiated: 09/15/2022 [place] ar[place] Resolution Date: 10/19/2022 Goal Status: Active Interventions: Assess patient/caregiver ability to manage medication regimen upon admission and as needed Patient/Caregiver given reconciled medication list upon admission, changes in medications and discharge from the [place]: New medication prescribed at [place] : 09/15/2022 Notes: Pressure Nursing Diagnoses: Knowledge deficit related to management of pressures ulcers Goals: Patient will remain  free of pressure ulcers Date Initiated: 09/15/2022 Target Resolution Date: 11/09/2022 Goal Status: Active Patient/caregiver will verbalize risk factors for pressure ulcer development Date Initiated: 09/15/2022 Target Resolution Date: 11/09/2022 Goal Status: Active Interventions: Assess potential for pressure ulcer upon admission and as needed [name], [name]  ([id number]) [id number] Page 5 of 9 Treatment Activities: Patient referred for pressure reduction/relief devices : 09/15/2022 Pressure reduction/relief device ordered : 09/15/2022 Notes: Electronic Signature(s) Signed: 09/15/2022 5:05:53 PM By: Marisa East RN Entered By: [name] on 09/15/2022 14:18:13 -------------------------------------------------------------------------------- Pain Assessment Details Patient Name: Date of Service: [name], [name] 09/15/2022 2:00 PM Medical Record Number: [id number] Patient Account Number: [id number] Date of Birth/Sex: Treating RN: 1950/10/06 (72 y.o. [name] Primary Care Aviona Martenson: [name] Other Clinician: Referring Amritpal Shropshire: Treating Lanell Carpenter/Extender: [name] in Treatment: 1 Active Problems Location of Pain Severity and Description of Pain Patient Has Paino No Site Locations Rate the pain. Current Pain Level: 0 Pain Management and Medication Current Pain Management: Electronic Signature(s) Signed: 09/15/2022 5:05:53 PM By: Marisa East RN Entered By: [name] on 09/15/2022 14:13:59 -------------------------------------------------------------------------------- Patient/Caregiver Education Details Patient Name: Date of Service: [name] 2/2/2024andnbsp2:00 PM Medical Record Number: [id number] Patient Account Number: [id number] Date of Birth/Gender: Treating RN: [birth year] (72 y.o. [name] Primary Care Physician: [name] Other Clinician: Referring Physician: Treating Physician/Extender: [name] in Treatment: 1 [name], [name] ([id number]) [id number] Page 6 of 9 Education Assessment Education Provided To: Patient Education Topics Provided Wound Debridement: Methods: Explain/Verbal Responses: Reinforcements needed, State content correctly Wound/Skin Impairment: Methods:  Explain/Verbal Responses: Reinforcements needed, State content correctly Electronic Signature(s) Signed: 09/15/2022 5:05:53 PM By: Marisa East RN Entered By: [name] on 09/15/2022 14:18:40 -------------------------------------------------------------------------------- Wound Assessment Details Patient Name: Date of Service: [name], [name] 09/15/2022 2:00 PM Medical Record Number: [id number] Patient Account Number: [id number] Date of Birth/Sex: Treating RN: [birth year] (72 y.o. F) Primary Care Dolores Ewing: [name] Other Clinician: Referring Neeya Prigmore: Treating Jayko Voorhees/Extender: [name] in Treatment: 1 Wound Status Wound Number: 1 Primary Pressure Ulcer Etiology: Wound Location: Right Gluteus Wound Open Wounding Event: Pressure Injury Status: Date Acquired: 08/11/2022 Comorbid Chronic sinus problems/congestion, Sleep Apnea, Congestive Heart Weeks Of Treatment: 1 History: Failure, Raynauds, Rheumatoid Arthritis, Neuropathy Clustered Wound: No Photos Wound Measurements Length: (cm) 4.2 Width: (cm) 4.2 Depth: (cm) 7.5 Area: (cm) 13.854 Volume: (cm) 103.908 % Reduction in Area: 37% % Reduction in Volume: 21.3% Tunneling: Yes Position (o'clock): 12 Maximum Distance: (cm) 8.5 Undermining: No Wound Description Classification: Category/Stage IV Wound Margin: Well defined, not attached Exudate Amount: Large [name], [name] ([id number]) Exudate Type: Serosanguineous Exudate Color: red, brown Foul Odor After Cleansing: No Slough/Fibrino No [id number] Page 7 of 9 Wound Bed Granulation Amount: Medium (34-66%) Exposed Structure Granulation Quality: Red Fascia Exposed: No Necrotic Amount: Medium (34-66%) Fat Layer (Subcutaneous Tissue) Exposed: Yes Necrotic Quality: Eschar, Adherent Slough Tendon Exposed: No Muscle Exposed: No Joint Exposed: No Bone Exposed: No Periwound Skin Texture Texture Color No  Abnormalities Noted: Yes No Abnormalities Noted: Yes Moisture Temperature / Pain No Abnormalities Noted: Yes Temperature: No Abnormality Tenderness on Palpation: Yes Treatment Notes Wound #1 (Gluteus) Wound Laterality: Right Cleanser Wound Cleanser Discharge Instruction: Cleanse the wound with wound cleanser prior to applying a clean dressing using gauze sponges, not tissue or cotton balls. Peri-Wound Care Topical Primary Dressing Dakin's Solution 0.25%, 16 (oz) Discharge Instruction: Moisten gauze with Dakin's solution and pack into wound Secondary Dressing Zetuvit Plus Silicone Border  Sacrum Dressing, Sm, 7x7 (in/in) Discharge Instruction: Apply silicone border over primary dressing as directed. Secured With Compression Wrap Compression Stockings Environmental education officer) Signed: 09/15/2022 5:05:53 PM By: Marisa East RN Entered By: [name] on 09/15/2022 14:26:20 -------------------------------------------------------------------------------- Wound Assessment Details Patient Name: Date of Service: [name], [name] 09/15/2022 2:00 PM Medical Record Number: [id number] Patient Account Number: [id number] Date of Birth/Sex: Treating RN: June 18, 1951 (72 y.o. F) Primary Care Neno Hohensee: [name] Other Clinician: Referring Andriel Omalley: Treating Pike Scantlebury/Extender: [name] in Treatment: 1 Wound Status Wound Number: 2 Primary Pressure Ulcer Etiology: Wound Location: Right, Distal Gluteus Wound Open Wounding Event: Pressure Injury Status: Date Acquired: 08/11/2022 Comorbid Chronic sinus problems/congestion, Sleep Apnea, Congestive Heart Weeks Of Treatment: 1 History: Failure, Raynauds, Rheumatoid Arthritis, Neuropathy Clustered Wound: No [name], [name] ([id number]) [id number] Page 8 of 9 Photos Wound Measurements Length: (cm) 0.9 Width: (cm) 0.9 Depth: (cm) 0.1 Area: (cm) 0.636 Volume: (cm) 0.064 %  Reduction in Area: -15.6% % Reduction in Volume: -16.4% Epithelialization: None Tunneling: No Undermining: No Wound Description Classification: Category/Stage III Wound Margin: Distinct, outline attached Exudate Amount: Medium Exudate Type: Serosanguineous Exudate Color: red, brown Foul Odor After Cleansing: No Slough/Fibrino Yes Wound Bed Granulation Amount: Small (1-33%) Exposed Structure Granulation Quality: Pale Fascia Exposed: No Necrotic Amount: Large (67-100%) Fat Layer (Subcutaneous Tissue) Exposed: Yes Necrotic Quality: Adherent Slough Tendon Exposed: No Muscle Exposed: No Joint Exposed: No Bone Exposed: No Periwound Skin Texture Texture Color No Abnormalities Noted: Yes No Abnormalities Noted: Yes Moisture Temperature / Pain No Abnormalities Noted: Yes Tenderness on Palpation: Yes Treatment Notes Wound #2 (Gluteus) Wound Laterality: Right, Distal Cleanser Peri-Wound Care Topical Primary Dressing Sorbalgon AG Dressing 2x2 (in/in) Discharge Instruction: Apply to wound bed as instructed Secondary Dressing Zetuvit Plus Silicone Border Dressing 4x4 (in/in) Discharge Instruction: Apply silicone border over primary dressing as directed. Secured With Compression Wrap Compression Stockings Environmental education officer) Signed: 09/15/2022 5:05:53 PM By: Marisa East RN Entered By: [name] on 09/15/2022 14:27:01 [name] ([id number]) [id number] Page 9 of 9 -------------------------------------------------------------------------------- Vitals Details Patient Name: Date of Service: [name], [name] 09/15/2022 2:00 PM Medical Record Number: [id number] Patient Account Number: [id number] Date of Birth/Sex: Treating RN: 1950-10-06 (72 y.o. Iver Nestle, Jamie Primary Care Luise Yamamoto: [name] Other Clinician: Referring Tayshaun Kroh: Treating Cope Marte/Extender: [name] in Treatment: 1 Vital  Signs Time Taken: 14:13 Temperature (F): 98.2 Height (in): 62 Pulse (bpm): 101 Weight (lbs): 207 Respiratory Rate (breaths/min): 18 Body Mass Index (BMI): 37.9 Blood Pressure (mmHg): 127/73 Reference Range: 80 - 120 mg / dl Electronic Signature(s) Signed: 09/15/2022 5:05:53 PM By: Marisa East RN Entered By: [name] on 09/15/2022 14:13:52

## 2022-09-18 ENCOUNTER — Ambulatory Visit (INDEPENDENT_AMBULATORY_CARE_PROVIDER_SITE_OTHER): Payer: PPO | Admitting: Nurse Practitioner

## 2022-09-18 ENCOUNTER — Encounter: Payer: Self-pay | Admitting: Nurse Practitioner

## 2022-09-18 VITALS — BP 110/64 | HR 65 | Resp 18

## 2022-09-18 DIAGNOSIS — M79604 Pain in right leg: Secondary | ICD-10-CM | POA: Diagnosis not present

## 2022-09-18 DIAGNOSIS — F418 Other specified anxiety disorders: Secondary | ICD-10-CM

## 2022-09-18 DIAGNOSIS — S7291XD Unspecified fracture of right femur, subsequent encounter for closed fracture with routine healing: Secondary | ICD-10-CM

## 2022-09-18 MED ORDER — OXYCODONE HCL 5 MG PO TABS: 5.0000 mg | ORAL_TABLET | Freq: Four times a day (QID) | ORAL | 0 refills | Status: DC | PRN

## 2022-09-18 MED ORDER — LORAZEPAM 0.5 MG PO TABS: ORAL_TABLET | ORAL | 1 refills | Status: DC

## 2022-09-18 NOTE — Progress Notes (Signed)
Established patient visit   Patient: [name]   DOB: Oct 28, 1950   72 y.o. Female  MRN: [id number] Visit Date: 09/18/2022   Chief Complaint  Patient presents with   Hip Injury   Subjective    HPI  Follow up  -injury to right femur --leg pain is getting some better -developed pressure injury to left buttock. She is now seeing th wound center.  -pressure sore is the most painful.  -initially, she had expected to come off pain medication by now. 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  -She denies chest pain, chest pressure, or shortness of breath. She denies headaches or visual disturbances. She denies abdominal pain, nausea, vomiting, or changes in bowel or bladder habits.     Medications: Outpatient Medications Prior to Visit  Medication Sig   amiodarone (PACERONE) 200 MG tablet Take 1 tablet (200 mg total) by mouth daily. NO Amiodarone taken on Sundays.   aspirin EC 81 MG tablet Take 81 mg by mouth at bedtime.   cetirizine (ZYRTEC) 10 MG tablet Take 1 tablet (10 mg total) by mouth daily. (Patient taking differently: Take 10 mg by mouth at bedtime.)   colestipol (COLESTID) 1 g tablet Take 2 g by mouth at bedtime.   cycloSPORINE (RESTASIS) 0.05 % ophthalmic emulsion Place 2 drops into both eyes 2 (two) times daily as needed (irritation).   diphenoxylate-atropine (LOMOTIL) 2.5-0.025 MG per tablet Take 1 tablet by mouth 4 (four) times daily as needed for diarrhea or loose stools.   doxylamine, Sleep, (UNISOM) 25 MG tablet Take 1 tablet (25 mg total) by mouth at bedtime as needed. (Patient taking differently: Take 25 mg by mouth at bedtime.)   fluticasone (FLONASE) 50 MCG/ACT nasal spray Place 1 spray into both nostrils daily. (Patient taking differently: Place 1 spray into both nostrils as needed for allergies.)   gabapentin (NEURONTIN) 300 MG capsule TAKE 2 CAPSULES BY MOUTH 2 TIMES DAILY. (Patient taking differently: Take 600 mg by  mouth 2 (two) times daily.)   hydroxychloroquine (PLAQUENIL) 200 MG tablet Take 1 tablet (200 mg total) by mouth daily. (Patient taking differently: Take 200 mg by mouth every Monday, Tuesday, Wednesday, Thursday, and Friday.)   hyoscyamine (ANASPAZ) 0.125 MG TBDP disintergrating tablet Place 0.25 mg under the tongue every 6 (six) hours as needed for cramping.    ibuprofen (ADVIL) 800 MG tablet Take 1 tablet (800 mg total) by mouth every 6 (six) hours as needed for mild pain.   leflunomide (ARAVA) 20 MG tablet TAKE 1 TABLET (20 MG TOTAL) BY MOUTH DAILY.   lidocaine (LIDODERM) 5 % PLACE 1 PATCH ONTO THE SKIN DAILY AS NEEDED FOR PAIN. REMOVE AND DISCARD PATCH WITHIN 12 HOURS OR AS DIRECTED BY MD. (Patient taking differently: Place 1 patch onto the skin 3 (three) times daily as needed (pain).)   [EXPIRED] methocarbamol (ROBAXIN) 500 MG tablet Take 1 tablet (500 mg total) by mouth every 6 (six) hours as needed for muscle spasms.   metoprolol succinate (TOPROL-XL) 25 MG 24 hr tablet Take 0.5 tablets (12.5 mg total) by mouth daily.   Multiple Vitamin (MULTIVITAMIN WITH MINERALS) TABS tablet Take 1 tablet by mouth daily.   mupirocin ointment (BACTROBAN) 2 % Place 1 Application into the nose 2 (two) times daily. Apply small amount ointment to effected areas twice daily for 10 days then as needed   NONFORMULARY OR COMPOUNDED ITEM Triamcinolone 0.1% & Silvadene cream 1:1- Apply as directed to  affected areas as needed   omeprazole (PRILOSEC) 40 MG capsule Take 40 mg by mouth as needed (nausea).   potassium chloride (KLOR-CON) 10 MEQ tablet Take 2 tablets (20 mEq total) by mouth daily.   Probiotic Product (PROBIOTIC DAILY PO) Take by mouth. Ultraflora IB Probiotic   promethazine (PHENERGAN) 25 MG tablet TAKE 1 TABLET BY MOUTH EVERY 6 HOURS AS NEEDED.   rosuvastatin (CRESTOR) 10 MG tablet Take 1 tablet (10 mg total) by mouth daily.   spironolactone (ALDACTONE) 25 MG tablet Take 12.5 mg by mouth daily.    torsemide 40 MG TABS Take 40 mg by mouth daily.   UNABLE TO FIND CPAP: AT bedtime; setting is "12"   [DISCONTINUED] fenofibrate (TRICOR) 48 MG tablet Take 1 tablet (48 mg total) by mouth daily. (Patient taking differently: Take 48 mg by mouth at bedtime.)   [DISCONTINUED] amoxicillin-clavulanate (AUGMENTIN) 875-125 MG tablet Take 1 tablet by mouth 2 (two) times daily. (Patient not taking: Reported on 09/18/2022)   [DISCONTINUED] Semaglutide, 1 MG/DOSE, 4 MG/3ML SOPN Inject 1 mg as directed once a week. (Patient taking differently: Inject 1 mg as directed every Sunday.)   No facility-administered medications prior to visit.    Review of Systems See HPI      Objective     Today's Vitals   09/18/22 1424  BP: 110/64  Pulse: 65  Resp: 18  SpO2: 99%   There is no height or weight on file to calculate BMI.  BP Readings from Last 3 Encounters:  09/18/22 110/64  08/31/22 (Abnormal) 104/56  08/29/22 (Abnormal) 103/51    Wt Readings from Last 3 Encounters:  08/31/22 207 lb (93.9 kg)  06/23/22 226 lb 13.7 oz (102.9 kg)  05/25/22 195 lb (88.5 kg)    Physical Exam Vitals and nursing note reviewed.  Constitutional:      Appearance: Normal appearance. She is well-developed.  HENT:     Head: Normocephalic and atraumatic.     Nose: Nose normal.  Eyes:     Extraocular Movements: Extraocular movements intact.     Conjunctiva/sclera: Conjunctivae normal.     Pupils: Pupils are equal, round, and reactive to light.  Cardiovascular:     Rate and Rhythm: Normal rate and regular rhythm.     Pulses: Normal pulses.     Heart sounds: Normal heart sounds.  Pulmonary:     Effort: Pulmonary effort is normal.     Breath sounds: Normal breath sounds.  Abdominal:     Palpations: Abdomen is soft.  Musculoskeletal:        General: Normal range of motion.     Cervical back: Normal range of motion and neck supple.     Comments: Activity level decreased. Currently in wheelchair. Uses manual  wheelchair. Doing well propelling chair with arms and "good leg." Moderate pain in right upper leg and hip.   Lymphadenopathy:     Cervical: No cervical adenopathy.  Skin:    General: Skin is warm and dry.     Capillary Refill: Capillary refill takes less than 2 seconds.     Comments: Wound of right buttock currently covered in clean and dry dressing. Very tender.   Neurological:     General: No focal deficit present.     Mental Status: She is alert and oriented to person, place, and time.  Psychiatric:        Mood and Affect: Mood normal.        Behavior: Behavior normal.  Thought Content: Thought content normal.        Judgment: Judgment normal.      Assessment & Plan    1. Pain in right leg Will continue oxycodone 5 mg which may be taken up to 4 times daily as needed for severe pain. New prescription for #56 tablets sent to pharmacy.  - oxyCODONE (ROXICODONE) 5 MG immediate release tablet; Take 1 tablet (5 mg total) by mouth every 6 (six) hours as needed for severe pain.  Dispense: 56 tablet; Refill: 0  2. Aftercare for healing traumatic fracture of right femur New prescription for oxycodone 5 mg sent to pharmacy. Patient to use especially prior to dressing changes for pressure ulcer.  - oxyCODONE (ROXICODONE) 5 MG immediate release tablet; Take 1 tablet (5 mg total) by mouth every 6 (six) hours as needed for severe pain.  Dispense: 56 tablet; Refill: 0  3. Situational anxiety Trial lorazepam 0.5 mg  daily 1 hour prior to dressing changes. Reassess at next visit.  - LORazepam (ATIVAN) 0.5 MG tablet; Take 1 tablet po QD prn 1 hour prior to dressing changes  Dispense: 30 tablet; Refill: 1   Problem List Items Addressed This Visit       Musculoskeletal and Integument   Aftercare for healing traumatic fracture of right femur   Relevant Medications   oxyCODONE (ROXICODONE) 5 MG immediate release tablet     Other   Pain in right leg - Primary   Relevant Medications    oxyCODONE (ROXICODONE) 5 MG immediate release tablet   Situational anxiety   Relevant Medications   LORazepam (ATIVAN) 0.5 MG tablet     Return in about 4 weeks (around 10/16/2022) for mood, ?need for pain medication. please reschedule her [place] for next time, 2or 3 mos. ok for me.         Ronnell Freshwater, NP  [facility] [phone] (phone) [phone] (fax)  [place]

## 2022-09-19 ENCOUNTER — Encounter (INDEPENDENT_AMBULATORY_CARE_PROVIDER_SITE_OTHER): Payer: Self-pay | Admitting: Family Medicine

## 2022-09-21 ENCOUNTER — Encounter (HOSPITAL_BASED_OUTPATIENT_CLINIC_OR_DEPARTMENT_OTHER): Payer: PPO | Admitting: General Surgery

## 2022-09-21 DIAGNOSIS — L89313 Pressure ulcer of right buttock, stage 3: Secondary | ICD-10-CM | POA: Diagnosis not present

## 2022-09-21 DIAGNOSIS — L89314 Pressure ulcer of right buttock, stage 4: Secondary | ICD-10-CM | POA: Diagnosis not present

## 2022-09-21 NOTE — Progress Notes (Signed)
[name], [name] ([id number]) [id number] Page 1 of 11 Visit Report for 09/21/2022 Chief Complaint Document Details Patient Name: Date of Service: [name], [name] 09/21/2022 1:30 PM Medical Record Number: [id number] Patient Account Number: [id number] Date of Birth/Sex: Treating RN: 09/01/1950 (72 y.o. F) Primary Care Provider: Leretha Pol Other Clinician: Referring Provider: Treating Provider/Extender: [name] in Treatment: 2 Information Obtained from: Patient Chief Complaint Patient is at the clinic for treatment of open pressure ulcers Electronic Signature(s) Signed: 09/21/2022 2:05:35 PM By: Fredirick Maudlin MD FACS Entered By: Fredirick Maudlin on 09/21/2022 14:05:35 -------------------------------------------------------------------------------- Debridement Details Patient Name: Date of Service: [name]. 09/21/2022 1:30 PM Medical Record Number: [id number] Patient Account Number: [id number] Date of Birth/Sex: Treating RN: 1950-09-17 (72 y.o. [name] Primary Care Provider: Leretha Pol Other Clinician: Referring Provider: Treating Provider/Extender: [name] in Treatment: 2 Debridement Performed for Assessment: Wound #2 Right,Distal Gluteus Performed By: Physician Fredirick Maudlin, MD Debridement Type: Debridement Level of Consciousness (Pre-procedure): Awake and Alert Pre-procedure Verification/Time Out Yes - 13:54 Taken: Start Time: 13:55 Pain Control: Lidocaine 4% T opical Solution T Area Debrided (L x W): otal 0.5 (cm) x 0.8 (cm) = 0.4 (cm) Tissue and other material debrided: Non-Viable, Slough, Slough Level: Non-Viable Tissue Debridement Description: Selective/Open Wound Instrument: Curette Bleeding: Minimum Hemostasis Achieved: Pressure Response to Treatment: Procedure was tolerated well Level of Consciousness (Post- Awake and Alert procedure): Post  Debridement Measurements of Total Wound Length: (cm) 0.5 Stage: Category/Stage III Width: (cm) 0.8 Depth: (cm) 0.1 Volume: (cm) 0.031 Character of Wound/Ulcer Post Debridement: Requires Further Debridement Post Procedure Diagnosis Same as [name], [name] ([id number]) [id number] Page 2 of 11 Notes Scribed for Dr. Celine Cooper by Blanche East, RN Electronic Signature(s) Signed: 09/21/2022 3:13:47 PM By: Fredirick Maudlin MD FACS Signed: 09/21/2022 3:36:20 PM By: Blanche East RN Entered By: Blanche East on 09/21/2022 13:57:45 -------------------------------------------------------------------------------- Debridement Details Patient Name: Date of Service: [name] 09/21/2022 1:30 PM Medical Record Number: [id number] Patient Account Number: [id number] Date of Birth/Sex: Treating RN: [birth year] (72 y.o. [name] Primary Care Provider: Leretha Pol Other Clinician: Referring Provider: Treating Provider/Extender: [name] in Treatment: 2 Debridement Performed for Assessment: Wound #1 Right Gluteus Performed By: Physician Fredirick Maudlin, MD Debridement Type: Debridement Level of Consciousness (Pre-procedure): Awake and Alert Pre-procedure Verification/Time Out Yes - 13:54 Taken: Start Time: 13:55 Pain Control: Lidocaine 4% T opical Solution T Area Debrided (L x W): otal 3.8 (cm) x 7 (cm) = 26.6 (cm) Tissue and other material debrided: Non-Viable, Muscle, Slough, Slough Level: Skin/Subcutaneous Tissue/Muscle Debridement Description: Excisional Instrument: Curette Bleeding: Minimum Hemostasis Achieved: Pressure Response to Treatment: Procedure was tolerated well Level of Consciousness (Post- Awake and Alert procedure): Post Debridement Measurements of Total Wound Length: (cm) 3.8 Stage: Category/Stage IV Width: (cm) 7 Depth: (cm) 3.6 Volume: (cm) 75.21 Character of Wound/Ulcer Post  Debridement: Requires Further Debridement Post Procedure Diagnosis Same as Pre-procedure Notes Scribed for Dr. Celine Cooper by Blanche East, RN Electronic Signature(s) Signed: 09/21/2022 3:13:47 PM By: Fredirick Maudlin MD FACS Signed: 09/21/2022 3:36:20 PM By: Blanche East RN Entered By: Blanche East on 09/21/2022 13:59:52 -------------------------------------------------------------------------------- HPI Details Patient Name: Date of Service: Marisa Ar Cooper. 09/21/2022 1:30 PM Medical Record Number: [id number] Patient Account Number: [id number] Date of Birth/Sex: Treating RN: Jan 21, 1951 (72 y.o. F) Primary Care Provider: Leretha Pol Other Clinician: [name], [name] ([id number]) [id number] Page 3 of 11 Referring Provider: Treating Provider/Extender: [name]  in Treatment: 2 History of Present Illness HPI Description: ADMISSION 09/07/2023 This is a 72 year old woman with a past medical history notable for obesity, congestive heart failure, Raynaud's syndrome, CKD stage IIIb, osteoporosis, and rheumatoid arthritis. In November 2023, she suffered a fall that resulted in a femur fracture. She was hospitalized for about a week and underwent surgical repair of the fracture. She subsequently developed pressure ulcers on her right buttocks and ischium. She has been receiving home health services and they have been applying Medihoney. They have been reporting the wounds as stage II, but on evaluation, the large ulcer was probably unstageable at the time they were evaluating it but it is clearly a stage IV at this point. The smaller ulcer does have fat layer exposure and therefore is a stage III. The patient is accompanied by her daughter. She says she has been sleeping on a regular bed but recently ordered an air mattress T opper, but does not have it yet. She is on Ozempic for weight loss and therefore has a poor appetite and struggles to get  adequate protein intake. 09/15/2022: The large stage IV ulcer is substantially cleaner this week. The stage III ulcer is a little smaller. Both have slough accumulation. The culture that I took last week was polymicrobial. The Augmentin that I prescribed was adequate coverage for the species and she continues to take this. We have also ordered Keystone topical antibiotic compound, but this has not yet arrived. 09/21/2022: The stage IV ulcer has some necrotic muscle at the base but is otherwise fairly clean. The stage III ulcer is smaller again with light slough on the surface. She has her Keystone topical antibiotic compound with her today. Electronic Signature(s) Signed: 09/21/2022 2:06:13 PM By: Fredirick Maudlin MD FACS Entered By: Fredirick Maudlin on 09/21/2022 14:06:12 -------------------------------------------------------------------------------- Physical Exam Details Patient Name: Date of Service: [name] 09/21/2022 1:30 PM Medical Record Number: [id number] Patient Account Number: [id number] Date of Birth/Sex: Treating RN: 01/12/1951 (72 y.o. F) Primary Care Provider: Leretha Pol Other Clinician: Referring Provider: Treating Provider/Extender: [name] in Treatment: 2 Constitutional Hypotensive, but normal for this patient. . . . no acute distress. Respiratory Normal work of breathing on room air. Notes 09/21/2022: The stage IV ulcer has some necrotic muscle at the base but is otherwise fairly clean. The stage III ulcer is smaller again with light slough on the surface. Electronic Signature(s) Signed: 09/21/2022 2:06:57 PM By: Fredirick Maudlin MD FACS Entered By: Fredirick Maudlin on 09/21/2022 14:06:57 -------------------------------------------------------------------------------- Physician Orders Details Patient Name: Date of Service: [name] 09/21/2022 1:30 PM Medical Record Number: [id number] Patient Account Number: [id number] Date of  Birth/Sex: Treating RN: 1950/09/26 (72 y.o. [name] Primary Care Provider: Leretha Pol Other Clinician: Referring Provider: Treating Provider/Extender: [name] in Treatment: 2 Verbal / Phone Orders: No MALORI, MYERS ([id number]) [id number] Page 4 of 11 Diagnosis Coding ICD-10 Coding Code Description L89.314 Pressure ulcer of right buttock, stage 4 L89.313 Pressure ulcer of right buttock, stage 3 I73.00 Raynaud's syndrome without gangrene L24.40 Chronic diastolic (congestive) heart failure N18.32 Chronic kidney disease, stage 3b R73.02 Impaired glucose tolerance (oral) E66.9 Obesity, unspecified Follow-up Appointments ppointment in 1 week. - Dr. Celine Cooper RM 1 Return A Thursday 09/28/22 at 2:45PM Anesthetic Wound #1 Right Gluteus (In clinic) Topical Lidocaine 5% applied to wound bed Bathing/ Shower/ Hygiene May shower and wash wound with soap and water. Off-Loading Low air-loss mattress (Group 2) - ordered from [facility] needs hospital bed Turn and reposition every 2 hours - avoid lying on back, stand at least every hour while out of bed Additional Orders / Instructions Follow Nutritious Diet - increase protein to 70-80 gms per day, hold ozempic Home Health New wound care orders this week; continue Home Health for wound care. May utilize formulary equivalent dressing for wound treatment orders unless otherwise specified. - see orders Other Home Health Orders/Instructions: - [facility] Wound Treatment Wound #1 - Gluteus Wound Laterality: Right Cleanser: Wound Cleanser 1 x Per Day/30 Days Discharge Instructions: Cleanse the wound with wound cleanser prior to applying a clean dressing using gauze sponges, not tissue or cotton balls. Prim Dressing: Dakin's Solution 0.25%, 16 (oz) 1 x Per Day/30 Days ary Discharge Instructions: Moisten gauze with Dakin's solution and pack into wound Secondary  Dressing: Zetuvit Plus Silicone Border Sacrum Dressing, Sm, 7x7 (in/in) 1 x Per Day/30 Days Discharge Instructions: Apply silicone border over primary dressing as directed. Wound #2 - Gluteus Wound Laterality: Right, Distal Prim Dressing: Sorbalgon AG Dressing 2x2 (in/in) 1 x Per Day/30 Days ary Discharge Instructions: Apply to wound bed as instructed Secondary Dressing: Zetuvit Plus Silicone Border Dressing 4x4 (in/in) 1 x Per Day/30 Days Discharge Instructions: Apply silicone border over primary dressing as directed. Electronic Signature(s) Signed: 09/21/2022 3:13:47 PM By: Fredirick Maudlin MD FACS Entered By: Fredirick Maudlin on 09/21/2022 14:07:44 -------------------------------------------------------------------------------- Problem List Details Patient Name: Date of Service: [name] 09/21/2022 1:30 PM Medical Record Number: [id number] Patient Account Number: [id number] Date of Birth/Sex: Treating RN: Jun 30, 1951 (72 y.o. F) Primary Care Provider: Leretha Pol Other Clinician: MYLANI, GENTRY ([id number]) [id number] Page 5 of 11 Referring Provider: Treating Provider/Extender: [name] in Treatment: 2 Active Problems ICD-10 Encounter Code Description Active Date MDM Diagnosis L89.314 Pressure ulcer of right buttock, stage 4 09/07/2022 No Yes L89.313 Pressure ulcer of right buttock, stage 3 09/07/2022 No Yes I73.00 Raynaud's syndrome without gangrene 09/07/2022 No Yes R42.70 Chronic diastolic (congestive) heart failure 09/07/2022 No Yes N18.32 Chronic kidney disease, stage 3b 09/07/2022 No Yes R73.02 Impaired glucose tolerance (oral) 09/07/2022 No Yes E66.9 Obesity, unspecified 09/07/2022 No Yes Inactive Problems Resolved Problems Electronic Signature(s) Signed: 09/21/2022 2:05:14 PM By: Fredirick Maudlin MD FACS Entered By: Fredirick Maudlin on 09/21/2022  14:05:14 -------------------------------------------------------------------------------- Progress Note Details Patient Name: Date of Service: Marisa Verda Cumins. 09/21/2022 1:30 PM Medical Record Number: [id number] Patient Account Number: [id number] Date of Birth/Sex: Treating RN: April 03, 1951 (73 y.o. F) Primary Care Provider: Leretha Pol Other Clinician: Referring Provider: Treating Provider/Extender: [name] in Treatment: 2 Subjective Chief Complaint Information obtained from Patient Patient is at the clinic for treatment of open pressure ulcers History of Present Illness (HPI) ADMISSION 09/07/2023 This is a 72 year old woman with a past medical history notable for obesity, congestive heart failure, Raynaud's syndrome, CKD stage IIIb, osteoporosis, and rheumatoid arthritis. In November 2023, she suffered a fall that resulted in a femur fracture. She was hospitalized for about a week and underwent surgical repair of the fracture. She subsequently developed pressure ulcers on her right buttocks and ischium. She has been receiving home health services and they [name], [name] ([id number]) [phone].pdf Page 6 of 11 have been applying Medihoney. They have been reporting the wounds as stage II, but on evaluation, the large ulcer was probably unstageable at the time they were evaluating it but it is clearly a stage IV at this point. The smaller ulcer does have fat layer exposure and therefore  is a stage III. The patient is accompanied by her daughter. She says she has been sleeping on a regular bed but recently ordered an air mattress T opper, but does not have it yet. She is on Ozempic for weight loss and therefore has a poor appetite and struggles to get adequate protein intake. 09/15/2022: The large stage IV ulcer is substantially cleaner this week. The stage III ulcer is a little smaller. Both have slough accumulation. The culture that  I took last week was polymicrobial. The Augmentin that I prescribed was adequate coverage for the species and she continues to take this. We have also ordered Keystone topical antibiotic compound, but this has not yet arrived. 09/21/2022: The stage IV ulcer has some necrotic muscle at the base but is otherwise fairly clean. The stage III ulcer is smaller again with light slough on the surface. She has her Keystone topical antibiotic compound with her today. Patient History Information obtained from Patient, Caregiver, Chart. Family History Cancer - Father,Paternal Grandparents, Heart Disease - Mother,Father,Paternal Grandparents, Hypertension - Mother, Stroke - Maternal Grandparents, No family history of Diabetes, Hereditary Spherocytosis, Kidney Disease, Lung Disease, Seizures, Thyroid Problems, Tuberculosis. 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 Medical History Eyes Denies history of Cataracts, Glaucoma, Optic Neuritis Ear/Nose/Mouth/Throat Patient has history of Chronic sinus problems/congestion - chronic rhinitis Denies history of Middle ear problems Respiratory Patient has history of Sleep Apnea - uses CPAP Cardiovascular Patient has history of Congestive Heart Failure Endocrine Denies history of Type I Diabetes, Type II Diabetes Genitourinary Denies history of End Stage Renal Disease Immunological Patient has history of Raynaudoos Denies history of Lupus Erythematosus, Scleroderma Integumentary (Skin) Denies history of History of Burn Musculoskeletal Patient has history of Rheumatoid Arthritis Denies history of Gout, Osteoarthritis, Osteomyelitis Neurologic Patient has history of Neuropathy Denies history of Dementia, Quadriplegia, Paraplegia, Seizure Disorder Oncologic Denies history of Received Chemotherapy, Received Radiation Psychiatric Denies history of  Anorexia/bulimia, Confinement Anxiety Hospitalization/Surgery History - right femur fx ORIR. - right knee replacement. - left breast mass excision. - left ankle tibia repair. - abdominal hysterectomy. - adnoidectomy/tonsillectomy. - cholecystectomy. - dental implants. - patoid cystectomy. - tubal ligation. Medical A Surgical History Notes nd Constitutional Symptoms (General Health) morbid obesity Cardiovascular hyperlipidemia Gastrointestinal GERD, IBS, eosinophilic esophagitis, diverticulosis, Genitourinary CKD stage 3 Immunological Sjoegren syndrome, fibromyalgia Objective Constitutional Hypotensive, but normal for this patient. no acute distress. Vitals Time Taken: 1:45 PM, Height: 62 in, Weight: 207 lbs, BMI: 37.9, Temperature: 98.1 F, Pulse: 69 bpm, Respiratory Rate: 18 breaths/min, Blood Pressure: 95/43 mmHg. Respiratory Normal work of breathing on room air. [name], [name] ([id number]) [id number] Page 7 of 11 General Notes: 09/21/2022: The stage IV ulcer has some necrotic muscle at the base but is otherwise fairly clean. The stage III ulcer is smaller again with light slough on the surface. Integumentary (Hair, Skin) Wound #1 status is Open. Original cause of wound was Pressure Injury. The date acquired was: 08/11/2022. The wound has been in treatment 2 weeks. The wound is located on the Right Gluteus. The wound measures 3.8cm length x 7cm width x 3.6cm depth; 20.892cm^2 area and 75.21cm^3 volume. There is Fat Layer (Subcutaneous Tissue) exposed. There is no undermining noted, however, there is tunneling at 11:00 with a maximum distance of 8.9cm. There is a large amount of serosanguineous drainage noted. The wound margin is well defined and not attached to the  wound base. There is medium (34-66%) red granulation within the wound bed. There is a medium (34-66%) amount of necrotic tissue within the wound bed including Eschar and Adherent Slough. The  periwound skin appearance had no abnormalities noted for texture. The periwound skin appearance had no abnormalities noted for moisture. The periwound skin appearance had no abnormalities noted for color. Periwound temperature was noted as No Abnormality. The periwound has tenderness on palpation. Wound #2 status is Open. Original cause of wound was Pressure Injury. The date acquired was: 08/11/2022. The wound has been in treatment 2 weeks. The wound is located on the Right,Distal Gluteus. The wound measures 0.5cm length x 0.8cm width x 0.1cm depth; 0.314cm^2 area and 0.031cm^3 volume. There is Fat Layer (Subcutaneous Tissue) exposed. There is no tunneling or undermining noted. There is a medium amount of serosanguineous drainage noted. The wound margin is distinct with the outline attached to the wound base. There is small (1-33%) pale granulation within the wound bed. There is a large (67-100%) amount of necrotic tissue within the wound bed including Adherent Slough. The periwound skin appearance had no abnormalities noted for texture. The periwound skin appearance had no abnormalities noted for moisture. The periwound skin appearance had no abnormalities noted for color. The periwound has tenderness on palpation. Assessment Active Problems ICD-10 Pressure ulcer of right buttock, stage 4 Pressure ulcer of right buttock, stage 3 Raynaud's syndrome without gangrene Chronic diastolic (congestive) heart failure Chronic kidney disease, stage 3b Impaired glucose tolerance (oral) Obesity, unspecified Procedures Wound #1 Pre-procedure diagnosis of Wound #1 is a Pressure Ulcer located on the Right Gluteus . There was a Excisional Skin/Subcutaneous Tissue/Muscle Debridement with a total area of 26.6 sq cm performed by Fredirick Maudlin, MD. With the following instrument(s): Curette to remove Non-Viable tissue/material. Material removed includes Muscle and Slough and after achieving pain control using  Lidocaine 4% T opical Solution. No specimens were taken. A time out was conducted at 13:54, prior to the start of the procedure. A Minimum amount of bleeding was controlled with Pressure. The procedure was tolerated well. Post Debridement Measurements: 3.8cm length x 7cm width x 3.6cm depth; 75.21cm^3 volume. Post debridement Stage noted as Category/Stage IV. Character of Wound/Ulcer Post Debridement requires further debridement. Post procedure Diagnosis Wound #1: Same as Pre-Procedure General Notes: Scribed for Dr. Celine Cooper by Blanche East, RN. Wound #2 Pre-procedure diagnosis of Wound #2 is a Pressure Ulcer located on the Right,Distal Gluteus . There was a Selective/Open Wound Non-Viable Tissue Debridement with a total area of 0.4 sq cm performed by Fredirick Maudlin, MD. With the following instrument(s): Curette to remove Non-Viable tissue/material. Material removed includes [facility] after achieving pain control using Lidocaine 4% Topical Solution. No specimens were taken. A time out was conducted at 13:54, prior to the start of the procedure. A Minimum amount of bleeding was controlled with Pressure. The procedure was tolerated well. Post Debridement Measurements: 0.5cm length x 0.8cm width x 0.1cm depth; 0.031cm^3 volume. Post debridement Stage noted as Category/Stage III. Character of Wound/Ulcer Post Debridement requires further debridement. Post procedure Diagnosis Wound #2: Same as Pre-Procedure General Notes: Scribed for Dr. Celine Cooper by Blanche East, RN. Plan Follow-up Appointments: Return Appointment in 1 week. - Dr. Celine Cooper RM 1 Thursday 09/28/22 at 2:45PM Anesthetic: Wound #1 Right Gluteus: (In clinic) Topical Lidocaine 5% applied to wound bed Bathing/ Shower/ Hygiene: May shower and wash wound with soap and water. Off-Loading: Low air-loss mattress (Group 2) - ordered from [facility] needs hospital bed Turn and reposition  every 2 hours - avoid lying on back, stand at least every  hour while out of bed Additional Orders / Instructions: Follow Nutritious Diet - increase protein to 70-80 gms per day, hold ozempic Home Health: New wound care orders this week; continue Home Health for wound care. May utilize formulary equivalent dressing for wound treatment orders unless otherwise specified. - see orders Other Home Health Orders/Instructions: - [facility] WOUND #1: - Gluteus Wound Laterality: Right Cleanser: Wound Cleanser 1 x Per Day/30 Days Discharge Instructions: Cleanse the wound with wound cleanser prior to applying a clean dressing using gauze sponges, not tissue or cotton balls. [name], [name] ([id number]) [id number] Page 8 of 11 Prim Dressing: Dakin's Solution 0.25%, 16 (oz) 1 x Per Day/30 Days ary Discharge Instructions: Moisten gauze with Dakin's solution and pack into wound Secondary Dressing: Zetuvit Plus Silicone Border Sacrum Dressing, Sm, 7x7 (in/in) 1 x Per Day/30 Days Discharge Instructions: Apply silicone border over primary dressing as directed. WOUND #2: - Gluteus Wound Laterality: Right, Distal Prim Dressing: Sorbalgon AG Dressing 2x2 (in/in) 1 x Per Day/30 Days ary Discharge Instructions: Apply to wound bed as instructed Secondary Dressing: Zetuvit Plus Silicone Border Dressing 4x4 (in/in) 1 x Per Day/30 Days Discharge Instructions: Apply silicone border over primary dressing as directed. 09/21/2022: The stage IV ulcer has some necrotic muscle at the base but is otherwise fairly clean. The stage III ulcer is smaller again with light slough on the surface. I used a curette to debride slough from the stage III ulcer and from the surface of the stage IV ulcer. I then used forceps and tissue scissors to cut away the necrotic muscle at the base of the stage IV ulcer. We will continue to apply silver alginate to the stage III ulcer. We will use her Keystone topical antibiotic compound on the surface of the stage IV wound and  packed the cavity with Dakin's moistened gauze. Follow-up in 1 week. Electronic Signature(s) Signed: 09/21/2022 2:08:32 PM By: Fredirick Maudlin MD FACS Entered By: Fredirick Maudlin on 09/21/2022 14:08:32 -------------------------------------------------------------------------------- HxROS Details Patient Name: Date of Service: Marisa Franchot Gallo Cooper. 09/21/2022 1:30 PM Medical Record Number: [id number] Patient Account Number: [id number] Date of Birth/Sex: Treating RN: [birth year] (72 y.o. F) Primary Care Provider: Leretha Pol Other Clinician: Referring Provider: Treating Provider/Extender: [name] in Treatment: 2 Information Obtained From Patient Caregiver Chart Constitutional Symptoms (General Health) Medical History: Past Medical History Notes: morbid obesity Eyes Medical History: Negative for: Cataracts; Glaucoma; Optic Neuritis Ear/Nose/Mouth/Throat Medical History: Positive for: Chronic sinus problems/congestion - chronic rhinitis Negative for: Middle ear problems Respiratory Medical History: Positive for: Sleep Apnea - uses CPAP Cardiovascular Medical History: Positive for: Congestive Heart Failure Past Medical History Notes: hyperlipidemia Gastrointestinal Medical History: Past Medical History Notes: GERD, IBS, eosinophilic esophagitis, diverticulosis, [name], [name] ([id number]) [id number] Page 9 of 11 Endocrine Medical History: Negative for: Type I Diabetes; Type II Diabetes Genitourinary Medical History: Negative for: End Stage Renal Disease Past Medical History Notes: CKD stage 3 Immunological Medical History: Positive for: Raynauds Negative for: Lupus Erythematosus; Scleroderma Past Medical History Notes: Sjoegren syndrome, fibromyalgia Integumentary (Skin) Medical History: Negative for: History of Burn Musculoskeletal Medical History: Positive for: Rheumatoid Arthritis Negative for: Gout;  Osteoarthritis; Osteomyelitis Neurologic Medical History: Positive for: Neuropathy Negative for: Dementia; Quadriplegia; Paraplegia; Seizure Disorder Oncologic Medical History: Negative for: Received Chemotherapy; Received Radiation Psychiatric Medical History: Negative for: Anorexia/bulimia; Confinement Anxiety HBO Extended History Items Ear/Nose/Mouth/Throat: Chronic sinus problems/congestion Immunizations Pneumococcal Vaccine: Received Pneumococcal Vaccination: Yes  Received Pneumococcal Vaccination On or After 60th Birthday: Yes Implantable Devices None Hospitalization / Surgery History Type of Hospitalization/Surgery right femur fx ORIR right knee replacement left breast mass excision left ankle tibia repair abdominal hysterectomy adnoidectomy/tonsillectomy cholecystectomy dental implants patoid cystectomy tubal ligation Family and Social History Cancer: Yes - Father,Paternal Grandparents; Diabetes: No; Heart Disease: Yes - Mother,Father,Paternal Grandparents; Hereditary Spherocytosis: No; Hypertension: Yes - Mother; Kidney Disease: No; Lung Disease: No; Seizures: No; Stroke: Yes - Maternal Grandparents; Thyroid Problems: No; Tuberculosis: [name], [name] ([id number]) [id number] Page 10 of 11 No; Never smoker; Marital Status - Widowed; Alcohol Use: Rarely; Drug Use: Prior History - TCH; Caffeine Use: Daily - soda, tea; Financial Concerns: No; Food, Clothing or Shelter Needs: No; Support System Lacking: No; Transportation Concerns: No Electronic Signature(s) Signed: 09/21/2022 3:13:47 PM By: Fredirick Maudlin MD FACS Entered By: Fredirick Maudlin on 09/21/2022 14:06:32 -------------------------------------------------------------------------------- SuperBill Details Patient Name: Date of Service: [name] 09/21/2022 Medical Record Number: [id number] Patient Account Number: [id number] Date of Birth/Sex: Treating RN: 10-28-1950 (72 y.o.  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 Obesity, unspecified Facility Procedures : CPT4 Code: 63846659 Description: Yell - DEB MUSC/FASCIA 20 SQ CM/< ICD-10 Diagnosis Description L89.314 Pressure ulcer of right buttock, stage 4 Modifier: Quantity: 1 : CPT4 Code: 93570177 Description: 11046 - DEB MUSC/FASCIA EA ADDL 20 CM ICD-10 Diagnosis Description L89.314 Pressure ulcer of right buttock, stage 4 Modifier: Quantity: 1 : CPT4 Code: 93903009 Description: 23300 - DEBRIDE WOUND 1ST 20 SQ CM OR < ICD-10 Diagnosis Description L89.313 Pressure ulcer of right buttock, stage 3 Modifier: Quantity: 1 Physician Procedures : CPT4 Code Description Modifier [id number] 35456 - WC PHYS LEVEL 4 - EST PT 25 ICD-10 Diagnosis Description L89.314 Pressure ulcer of right buttock, stage 4 L89.313 Pressure ulcer of right buttock, stage 3 N18.32 Chronic kidney disease, stage 3b Y56.38  Chronic diastolic (congestive) heart failure Quantity: 1 : 9373428 11043 - WC PHYS DEBR MUSCLE/FASCIA 20 SQ CM ICD-10 Diagnosis Description L89.314 Pressure ulcer of right buttock, stage 4 Quantity: 1 : 7681157 11046 - WC PHYS DEB MUSC/FASC EA ADDL 20 CM ICD-10 Diagnosis Description L89.314 Pressure ulcer of right buttock, stage 4 Quantity: 1 : 2620355 97597 - WC PHYS DEBR WO ANESTH 20 SQ CM [place], KILCREASE Cooper ([id number]) [id number] Quantity: 1 Page 11 of 11 : ICD-10 Diagnosis Description L89.313 Pressure ulcer of right buttock, stage  3 Quantity: Electronic Signature(s) Signed: 09/21/2022 2:08:59 PM By: Fredirick Maudlin MD FACS Entered By: Fredirick Maudlin on 09/21/2022 14:08:59

## 2022-09-21 NOTE — Progress Notes (Signed)
[name], [name] ([id number]) [phone].pdf Page 1 of 7 Visit Report for 09/21/2022 Arrival Information Details Patient Name: Date of Service: [name], [name] 09/21/2022 1:30 PM Medical Record Number: [id number] Patient Account Number: [id number] Date of Birth/Sex: Treating RN: 11-18-1950 (72 y.o. [name] Primary Care [name]: [name] Other Clinician: Referring [name]: Treating [name]/Extender: [name] in Treatment: 2 Visit Information History Since Last Visit Added or deleted any medications: No Patient Arrived: Wheel Chair Any new allergies or adverse reactions: No Arrival Time: 13:43 Had a fall or experienced change in No Accompanied By: daughter activities of daily living that may affect Transfer Assistance: Manual risk of falls: Patient Identification Verified: Yes Signs or symptoms of abuse/neglect since last visito No Secondary Verification Process Completed: Yes Hospitalized since last visit: No Patient Requires Transmission-Based Precautions: No Implantable device outside of the clinic excluding No Patient Has Alerts: No cellular tissue based products placed in the center since last visit: Has Dressing in Place as Prescribed: Yes Pain Present Now: No Electronic Signature(s) Signed: 09/21/2022 3:36:20 PM By: Marisa East RN Entered By: [name] on 09/21/2022 13:43:40 -------------------------------------------------------------------------------- Lower Extremity Assessment Details Patient Name: Date of Service: [name], [name] 09/21/2022 1:30 PM Medical Record Number: [id number] Patient Account Number: [id number] Date of Birth/Sex: Treating RN: [birth year] (72 y.o. [name] Primary Care [name]: [name] Other Clinician: Referring [name]: Treating [name]/Extender: [name] in Treatment: 2 Electronic Signature(s) Signed: 09/21/2022 3:36:20 PM  By: Marisa East RN Entered By: [name] on 09/21/2022 13:44:25 -------------------------------------------------------------------------------- Multi Wound Chart Details Patient Name: Date of Service: [name]. 09/21/2022 1:30 PM Medical Record Number: [id number] Patient Account Number: [id number] Date of Birth/Sex: Treating RN: [birth year] (72 y.o. F) Primary Care [name]: [name] Other Clinician: Referring Nakota Ackert: Treating Kyzen Horn/Extender: [name] in Treatment: [address] [name] ([id number]) [id number] Page 2 of 7 Vital Signs Height(in): 62 Pulse(bpm): 69 Weight(lbs): 207 Blood Pressure(mmHg): 95/43 Body Mass Index(BMI): 37.9 Temperature(F): 98.1 Respiratory Rate(breaths/min): 18 [1:Photos:] [N/A:N/A] Right Gluteus Right, Distal Gluteus N/A Wound Location: Pressure Injury Pressure Injury N/A Wounding Event: Pressure Ulcer Pressure Ulcer N/A Primary Etiology: Chronic sinus problems/congestion, Chronic sinus problems/congestion, N/A Comorbid History: Sleep Apnea, Congestive Heart Sleep Apnea, Congestive Heart Failure, Raynauds, Rheumatoid Failure, Raynauds, Rheumatoid Arthritis, Neuropathy Arthritis, Neuropathy 08/11/2022 08/11/2022 N/A Date Acquired: 2 2 N/A Weeks of Treatment: Open Open N/A Wound Status: No No N/A Wound Recurrence: 3.8x7x3.6 0.5x0.8x0.1 N/A Measurements L x W x D (cm) 20.892 0.314 N/A A (cm) : rea 75.21 0.031 N/A Volume (cm) : 5.00% 42.90% N/A % Reduction in A rea: 43.00% 43.60% N/A % Reduction in Volume: 11 Position 1 (o'clock): 8.9 Maximum Distance 1 (cm): Yes No N/A Tunneling: Category/Stage IV Category/Stage III N/A Classification: Large Medium N/A Exudate A mount: Serosanguineous Serosanguineous N/A Exudate Type: red, brown red, brown N/A Exudate Color: Well defined, not attached Distinct, outline attached N/A Wound Margin: Medium (34-66%)  Small (1-33%) N/A Granulation A mount: Red Pale N/A Granulation Quality: Medium (34-66%) Large (67-100%) N/A Necrotic A mount: Eschar, Adherent Slough Adherent Slough N/A Necrotic Tissue: Fat Layer (Subcutaneous Tissue): Yes Fat Layer (Subcutaneous Tissue): Yes N/A Exposed Structures: Fascia: No Fascia: No Tendon: No Tendon: No Muscle: No Muscle: No Joint: No Joint: No Bone: No Bone: No N/A None N/A Epithelialization: Debridement - Excisional Debridement - Selective/Open Wound N/A Debridement: Pre-procedure Verification/Time Out 13:54 13:54 N/A Taken: Lidocaine 4% Topical Solution Lidocaine 4% Topical Solution N/A Pain  Control: Muscle, [name] N/A Tissue Debrided: Skin/Subcutaneous Tissue/Muscle Non-Viable Tissue N/A Level: 26.6 0.4 N/A Debridement A (sq cm): rea Curette Curette N/A Instrument: Minimum Minimum N/A Bleeding: Pressure Pressure N/A Hemostasis A chieved: Procedure was tolerated well Procedure was tolerated well N/A Debridement Treatment Response: 3.8x7x3.6 0.5x0.8x0.1 N/A Post Debridement Measurements L x W x D (cm) 75.21 0.031 N/A Post Debridement Volume: (cm) Category/Stage IV Category/Stage III N/A Post Debridement Stage: Excoriation: No Excoriation: No N/A Periwound Skin Texture: Induration: No Induration: No Callus: No Callus: No Crepitus: No Crepitus: No Rash: No Rash: No Scarring: No Scarring: No Maceration: No Maceration: No N/A Periwound Skin Moisture: Dry/Scaly: No Dry/Scaly: No Atrophie Marisa: No Atrophie Marisa: No N/A Periwound Skin Color: Cyanosis: No Cyanosis: No Ecchymosis: No Ecchymosis: No Erythema: No Erythema: No Hemosiderin Staining: No Hemosiderin Staining: No Mottled: No Mottled: No [name], [name] ([id number]) [id number].pdf Page 3 of 7 Pallor: No Pallor: No Rubor: No Rubor: No No Abnormality N/A N/A Temperature: Yes Yes N/A Tenderness on Palpation: Debridement  Debridement N/A Procedures Performed: Treatment Notes Electronic Signature(s) Signed: 09/21/2022 2:05:28 PM By: Marisa Maudlin MD FACS Entered By: [name] on 09/21/2022 14:05:28 -------------------------------------------------------------------------------- [place] Details Patient Name: Date of Service: [name]. 09/21/2022 1:30 PM Medical Record Number: [id number] Patient Account Number: [id number] Date of Birth/Sex: Treating RN: 06-23-1951 (72 y.o. [name] Primary Care Roald Lukacs: [name] Other Clinician: Referring Anthonette Lesage: Treating Giorgi Debruin/Extender: [name] in Treatment: 2 Active Inactive Medication Nursing Diagnoses: Knowledge deficit related to medication safety: actual or potential Goals: Patient/caregiver will demonstrate understanding of all current medications Date Initiated: 09/15/2022 T arget Resolution Date: 10/19/2022 Goal Status: Active Interventions: Assess patient/caregiver ability to manage medication regimen upon admission and as needed Patient/Caregiver given reconciled medication list upon admission, changes in medications and discharge from the [facility]: New medication prescribed at [place] : 09/15/2022 Notes: Pressure Nursing Diagnoses: Knowledge deficit related to management of pressures ulcers Goals: Patient will remain free of pressure ulcers Date Initiated: 09/15/2022 Target Resolution Date: 11/09/2022 Goal Status: Active Patient/caregiver will verbalize risk factors for pressure ulcer development Date Initiated: 09/15/2022 Target Resolution Date: 11/09/2022 Goal Status: Active Interventions: Assess potential for pressure ulcer upon admission and as needed Treatment Activities: Patient referred for pressure reduction/relief devices : 09/15/2022 Pressure reduction/relief device ordered : 09/15/2022 Notes: Electronic Signature(s) Signed: 09/21/2022  3:36:20 PM By: Marisa East RN Eulas Post, Signed:E2/03/2023 3:36:20 PM By: Marisa East RN [name] ([id number]) [id number] Page 4 of 7 Entered By: [name] on 09/21/2022 13:50:36 -------------------------------------------------------------------------------- Pain Assessment Details Patient Name: Date of Service: [name], [name] 09/21/2022 1:30 PM Medical Record Number: [id number] Patient Account Number: [id number] Date of Birth/Sex: Treating RN: 12/15/1950 (72 y.o. [name] Primary Care Glendell Schlottman: [name] Other Clinician: Referring Korry Dalgleish: Treating Dontaye Hur/Extender: [name] in Treatment: 2 Active Problems Location of Pain Severity and Description of Pain Patient Has Paino No Site Locations Rate the pain. Current Pain Level: 0 Pain Management and Medication Current Pain Management: Electronic Signature(s) Signed: 09/21/2022 3:36:20 PM By: Marisa East RN Entered By: [name] on 09/21/2022 13:44:18 -------------------------------------------------------------------------------- Patient/Caregiver Education Details Patient Name: Date of Service: [name] 2/8/2024andnbsp1:30 PM Medical Record Number: [id number] Patient Account Number: [id number] Date of Birth/Gender: Treating RN: 14-Apr-1951 (72 y.o. [name] Primary Care Physician: [name] Other Clinician: Referring Physician: Treating Physician/Extender: [name] in Treatment: 2 Education Assessment Education Provided To: Patient Education Topics  Provided [name], [name] ([id number]) [phone].pdf Page 5 of 7 Wound Debridement: Methods: Explain/Verbal Responses: Reinforcements needed, State content correctly Wound/Skin Impairment: Methods: Explain/Verbal Responses: Reinforcements needed, State content correctly Electronic Signature(s) Signed: 09/21/2022 3:36:20 PM By:  Marisa East RN Entered By: [name] on 09/21/2022 13:51:07 -------------------------------------------------------------------------------- Wound Assessment Details Patient Name: Date of Service: [name], [name] 09/21/2022 1:30 PM Medical Record Number: [id number] Patient Account Number: [id number] Date of Birth/Sex: Treating RN: 1951/06/09 (72 y.o. Iver Nestle, Jamie Primary Care Jerricka Carvey: [name] Other Clinician: Referring Dondi Aime: Treating Valmore Arabie/Extender: [name] in Treatment: 2 Wound Status Wound Number: 1 Primary Pressure Ulcer Etiology: Wound Location: Right Gluteus Wound Open Wounding Event: Pressure Injury Status: Date Acquired: 08/11/2022 Comorbid Chronic sinus problems/congestion, Sleep Apnea, Congestive Heart Weeks Of Treatment: 2 History: Failure, Raynauds, Rheumatoid Arthritis, Neuropathy Clustered Wound: No Photos Wound Measurements Length: (cm) 3.8 Width: (cm) 7 Depth: (cm) 3.6 Area: (cm) 20.892 Volume: (cm) 75.21 % Reduction in Area: 5% % Reduction in Volume: 43% Tunneling: Yes Position (o'clock): 11 Maximum Distance: (cm) 8.9 Undermining: No Wound Description Classification: Category/Stage IV Wound Margin: Well defined, not attached Exudate Amount: Large Exudate Type: Serosanguineous Exudate Color: red, brown Foul Odor After Cleansing: No Slough/Fibrino No Wound Bed Granulation Amount: Medium (34-66%) Exposed Structure Granulation Quality: Red Fascia Exposed: No Necrotic Amount: Medium (34-66%) Fat Layer (Subcutaneous Tissue) Exposed: Yes Necrotic Quality: Eschar, Adherent Slough Tendon Exposed: No [name], [name] ([id number]) [id number] Page 6 of 7 Muscle Exposed: No Joint Exposed: No Bone Exposed: No Periwound Skin Texture Texture Color No Abnormalities Noted: Yes No Abnormalities Noted: Yes Moisture Temperature / Pain No Abnormalities Noted: Yes Temperature: No  Abnormality Tenderness on Palpation: Yes Electronic Signature(s) Signed: 09/21/2022 3:36:20 PM By: Marisa East RN Entered By: [name] on 09/21/2022 13:49:58 -------------------------------------------------------------------------------- Wound Assessment Details Patient Name: Date of Service: [name] 09/21/2022 1:30 PM Medical Record Number: [id number] Patient Account Number: [id number] Date of Birth/Sex: Treating RN: 1951-02-03 (73 y.o. Iver Nestle, Jamie Primary Care Jenice Leiner: [name] Other Clinician: Referring Marvella Jenning: Treating Lovella Hardie/Extender: [name] in Treatment: 2 Wound Status Wound Number: 2 Primary Pressure Ulcer Etiology: Wound Location: Right, Distal Gluteus Wound Open Wounding Event: Pressure Injury Status: Date Acquired: 08/11/2022 Comorbid Chronic sinus problems/congestion, Sleep Apnea, Congestive Heart Weeks Of Treatment: 2 History: Failure, Raynauds, Rheumatoid Arthritis, Neuropathy Clustered Wound: No Photos Wound Measurements Length: (cm) 0.5 Width: (cm) 0.8 Depth: (cm) 0.1 Area: (cm) 0.314 Volume: (cm) 0.031 % Reduction in Area: 42.9% % Reduction in Volume: 43.6% Epithelialization: None Tunneling: No Undermining: No Wound Description Classification: Category/Stage III Wound Margin: Distinct, outline attached Exudate Amount: Medium Exudate Type: Serosanguineous Exudate Color: red, brown Foul Odor After Cleansing: No Slough/Fibrino Yes Wound Bed Granulation Amount: Small (1-33%) Exposed Structure Granulation Quality: Pale Fascia Exposed: No Necrotic Amount: Large (67-100%) Fat Layer (Subcutaneous Tissue) Exposed: Yes Necrotic Quality: Adherent Slough Tendon Exposed: No [name], [name] ([id number]) [id number] Page 7 of 7 Muscle Exposed: No Joint Exposed: No Bone Exposed: No Periwound Skin Texture Texture Color No Abnormalities Noted: Yes No Abnormalities  Noted: Yes Moisture Temperature / Pain No Abnormalities Noted: Yes Tenderness on Palpation: Yes Electronic Signature(s) Signed: 09/21/2022 3:36:20 PM By: Marisa East RN Entered By: [name] on 09/21/2022 13:50:20 -------------------------------------------------------------------------------- Vitals Details Patient Name: Date of Service: [name]. 09/21/2022 1:30 PM Medical Record Number: [id number] Patient Account Number: [id number] Date of Birth/Sex: Treating RN: [birth year] (72 y.o. Iver Nestle, Jamie Primary Care Sergi Gellner: [name]  Other Clinician: Referring Heer Justiss: Treating Jameon Deller/Extender: [name] in Treatment: 2 Vital Signs Time Taken: 13:45 Temperature (F): 98.1 Height (in): 62 Pulse (bpm): 69 Weight (lbs): 207 Respiratory Rate (breaths/min): 18 Body Mass Index (BMI): 37.9 Blood Pressure (mmHg): 95/43 Reference Range: 80 - 120 mg / dl Electronic Signature(s) Signed: 09/21/2022 3:36:20 PM By: Marisa East RN Entered By: [name] on 09/21/2022 13:45:33

## 2022-09-26 ENCOUNTER — Encounter (HOSPITAL_BASED_OUTPATIENT_CLINIC_OR_DEPARTMENT_OTHER): Payer: PPO | Admitting: General Surgery

## 2022-09-26 ENCOUNTER — Telehealth: Payer: Self-pay | Admitting: *Deleted

## 2022-09-26 NOTE — Telephone Encounter (Signed)
Labs received from: [facility]   Drawn on: 09/13/2022  Reviewed by: Hazel Sams, PA-C  Labs drawn:Iron and TIBC, Ferritin, Renal Function Panel, Potassium   Results: Ferritin 146     Potassium 2.7    Calcium 8.5    Phosphorus 2.4   Albumin 3.4   Hgb 9.6  Per [facility], Potassium very low. Please make sure patient has been evaluated by PCP or nephrology for this. Spoke with patient and she states nephrology is evaluating.

## 2022-09-27 ENCOUNTER — Telehealth: Payer: Self-pay

## 2022-09-27 ENCOUNTER — Encounter: Payer: PPO | Admitting: Family Medicine

## 2022-09-27 DIAGNOSIS — G4733 Obstructive sleep apnea (adult) (pediatric): Secondary | ICD-10-CM | POA: Diagnosis not present

## 2022-09-27 DIAGNOSIS — R269 Unspecified abnormalities of gait and mobility: Secondary | ICD-10-CM | POA: Diagnosis not present

## 2022-09-27 NOTE — Telephone Encounter (Signed)
Requested additional records from [facility] per Dr. Radford Pax.

## 2022-09-27 NOTE — Telephone Encounter (Signed)
-----   Message from Sueanne Margarita, MD sent at 09/22/2022  5:47 PM EST ----- That does not make sense to fluid restrict for somebody with a low potassium.  Can you please get the last office note from that nephrologist ----- Message ----- From: Joni Reining, RN Sent: 09/22/2022   5:44 PM EST To: Sueanne Margarita, MD  FYI-patient w/ recent  K+  of 2.7 found by nephrologist, who recommends fluid restriction.

## 2022-09-28 ENCOUNTER — Encounter (HOSPITAL_BASED_OUTPATIENT_CLINIC_OR_DEPARTMENT_OTHER): Payer: PPO | Admitting: General Surgery

## 2022-09-28 DIAGNOSIS — L89314 Pressure ulcer of right buttock, stage 4: Secondary | ICD-10-CM | POA: Diagnosis not present

## 2022-09-28 DIAGNOSIS — L89610 Pressure ulcer of right heel, unstageable: Secondary | ICD-10-CM | POA: Diagnosis not present

## 2022-09-29 DIAGNOSIS — N1831 Chronic kidney disease, stage 3a: Secondary | ICD-10-CM | POA: Diagnosis not present

## 2022-09-30 NOTE — Progress Notes (Addendum)
KEMBA, LEEDOM ([id number]) [phone].pdf Page 1 of 11 Visit Report for 09/28/2022 Arrival Information Details Patient Name: Date of Service: [name], [name] 09/28/2022 2:45 PM Medical Record Number: [id number] Patient Account Number: [id number] Date of Birth/Sex: Treating RN: 11-07-1950 (72 y.o. [name] Primary Care Zoria Rawlinson: Leretha Pol Other Clinician: Referring Floyd Wade: Treating Hiromi Knodel/Extender: Olin Pia in Treatment: 3 Visit Information History Since Last Visit Added or deleted any medications: No Patient Arrived: Wheel Chair Any new allergies or adverse reactions: No Arrival Time: 14:56 Had a fall or experienced change in No Accompanied By: daughter activities of daily living that may affect Transfer Assistance: None risk of falls: Patient Identification Verified: Yes Signs or symptoms of abuse/neglect since last visito No Secondary Verification Process Completed: Yes Hospitalized since last visit: No Patient Requires Transmission-Based Precautions: No Implantable device outside of the clinic excluding No Patient Has Alerts: No cellular tissue based products placed in the center since last visit: Has Dressing in Place as Prescribed: Yes Pain Present Now: Yes Electronic Signature(s) Signed: 09/28/2022 5:36:36 PM By: Baruch Gouty RN, BSN Entered By: Baruch Gouty on 09/28/2022 15:07:50 -------------------------------------------------------------------------------- Encounter Discharge Information Details Patient Name: Date of Service: [name] 09/28/2022 2:45 PM Medical Record Number: [id number] Patient Account Number: [id number] Date of Birth/Sex: Treating RN: 08-16-1950 (72 y.o. [name] Primary Care Larra Crunkleton: Leretha Pol Other Clinician: Referring Rannie Craney: Treating Katya Rolston/Extender: Olin Pia in Treatment: 3 Encounter Discharge Information  Items Post Procedure Vitals Discharge Condition: Stable Temperature (F): 97.6 Ambulatory Status: Wheelchair Pulse (bpm): 67 Discharge Destination: Home Respiratory Rate (breaths/min): 18 Transportation: Private Auto Blood Pressure (mmHg): 95/62 Accompanied By: daughter Schedule Follow-up Appointment: Yes Clinical Summary of Care: Patient Declined Electronic Signature(s) Signed: 09/28/2022 5:36:36 PM By: Baruch Gouty RN, BSN Entered By: Baruch Gouty on 09/28/2022 17:04:29 [name] ([id number].pdf Page 2 of 11 -------------------------------------------------------------------------------- Lower Extremity Assessment Details Patient Name: Date of Service: [name], [name] 09/28/2022 2:45 PM Medical Record Number: [id number] Patient Account Number: [id number] Date of Birth/Sex: Treating RN: [birth year] (72 y.o. [name] Primary Care Elie Gragert: Leretha Pol Other Clinician: Referring Osualdo Hansell: Treating Yanelle Sousa/Extender: Olin Pia in Treatment: 3 Vascular Assessment Blood Pressure: Brachial: [Left:150] [Right:150] Dorsalis Pedis: 176 Ankle: [Left:Dorsalis Pedis: 178 1.19] [Right:Posterior Tibial: 178 1.19] Electronic Signature(s) Signed: 09/28/2022 5:36:36 PM By: Baruch Gouty RN, BSN Entered By: Baruch Gouty on 09/28/2022 15:25:45 -------------------------------------------------------------------------------- Multi Wound Chart Details Patient Name: Date of Service: [name] 09/28/2022 2:45 PM Medical Record Number: [id number] Patient Account Number: [id number] Date of Birth/Sex: Treating RN: [birth year] (72 y.o. F) Primary Care Cooper Visconti: Leretha Pol Other Clinician: Referring Kathee Tumlin: Treating Mabry Santarelli/Extender: Olin Pia in Treatment: 3 Vital Signs Height(in): 19 Pulse(bpm): 19 Weight(lbs): 207 Blood Pressure(mmHg): 95/62 Body Mass  Index(BMI): 37.9 Temperature(F): 97.6 Respiratory Rate(breaths/min): 20 [1:Photos:] Right Gluteus Right, Distal Gluteus Right Calcaneus Wound Location: Pressure Injury Pressure Injury Pressure Injury Wounding Event: Pressure Ulcer Pressure Ulcer Pressure Ulcer Primary Etiology: Chronic sinus problems/congestion, Chronic sinus problems/congestion, Chronic sinus problems/congestion, Comorbid History: Sleep Apnea, Congestive Heart Sleep Apnea, Congestive Heart Sleep Apnea, Congestive Heart Failure, Raynauds, Rheumatoid Failure, Raynauds, Rheumatoid Failure, Raynauds, Rheumatoid Arthritis, Neuropathy Arthritis, Neuropathy Arthritis, Neuropathy 08/11/2022 08/11/2022 09/21/2022 Date Acquired: 3 3 0 Weeks of Treatment: Open Healed - Epithelialized Open Wound Status: No No No Wound Recurrence: 2.8x6x5.8 0x0x0 3x4.1x0.1 Measurements L x W x D (cm) 13.195 0 9.66 A (cm) : [name], [name] ([id number]) [phone].pdf  Page 3 of 11 76.529 0 0.966 Volume (cm) : 40.00% 100.00% N/A % Reduction in A rea: 42.00% 100.00% N/A % Reduction in Volume: 12 Starting Position 1 (o'clock): 3 Ending Position 1 (o'clock): 8.2 Maximum Distance 1 (cm): Yes No No Undermining: Category/Stage IV Category/Stage III Unstageable/Unclassified Classification: Large None Present Medium Exudate A mount: Serosanguineous N/A Serosanguineous Exudate Type: red, brown N/A red, brown Exudate Color: Distinct, outline attached N/A Flat and Intact Wound Margin: Large (67-100%) None Present (0%) None Present (0%) Granulation A mount: Red N/A N/A Granulation Quality: Small (1-33%) None Present (0%) Large (67-100%) Necrotic A mount: Fat Layer (Subcutaneous Tissue): Yes Fascia: No Fat Layer (Subcutaneous Tissue): Yes Exposed Structures: Fascia: No Fat Layer (Subcutaneous Tissue): No Fascia: No Tendon: No Tendon: No Tendon: No Muscle: No Muscle: No Muscle: No Joint: No Joint:  No Joint: No Bone: No Bone: No Bone: No None Large (67-100%) Small (1-33%) Epithelialization: Debridement - Excisional N/A Debridement - Selective/Open Wound Debridement: Pre-procedure Verification/Time Out 15:45 N/A 15:45 Taken: Lidocaine 4% Topical Solution N/A Lidocaine 4% Topical Solution Pain Control: Muscle, Subcutaneous, Slough N/A Slough Tissue Debrided: Skin/Subcutaneous Tissue/Muscle N/A Skin/Epidermis Level: 5.6 N/A 12.3 Debridement A (sq cm): rea Curette N/A Curette Instrument: Minimum N/A Minimum Bleeding: Pressure N/A Pressure Hemostasis A chieved: 6 N/A 3 Procedural Pain: 3 N/A 1 Post Procedural Pain: Procedure was tolerated well N/A Procedure was tolerated well Debridement Treatment Response: 2.8x6x5.8 N/A 3x4.1x0.1 Post Debridement Measurements L x W x D (cm) 76.529 N/A 0.966 Post Debridement Volume: (cm) Category/Stage IV N/A Unstageable/Unclassified Post Debridement Stage: Excoriation: No Excoriation: No No Abnormalities Noted Periwound Skin Texture: Induration: No Induration: No Callus: No Callus: No Crepitus: No Crepitus: No Rash: No Rash: No Scarring: No Scarring: No Maceration: No Maceration: No Maceration: Yes Periwound Skin Moisture: Dry/Scaly: No Dry/Scaly: No Dry/Scaly: No Atrophie Blanche: No Atrophie Blanche: No No Abnormalities Noted Periwound Skin Color: Cyanosis: No Cyanosis: No Ecchymosis: No Ecchymosis: No Erythema: No Erythema: No Hemosiderin Staining: No Hemosiderin Staining: No Mottled: No Mottled: No Pallor: No Pallor: No Rubor: No Rubor: No No Abnormality N/A No Abnormality Temperature: Yes Yes Yes Tenderness on Palpation: Debridement N/A Debridement Procedures Performed: Treatment Notes Wound #1 (Gluteus) Wound Laterality: Right Cleanser Wound Cleanser Discharge Instruction: Cleanse the wound with wound cleanser prior to applying a clean dressing using gauze sponges, not tissue or cotton  balls. Peri-Wound Care Topical Keystone antibiotic compound Discharge Instruction: squirt and spray into base of wound before packing Primary Dressing Dakin's Solution 0.25%, 16 (oz) Discharge Instruction: Moisten gauze with Dakin's solution and pack into wound Secondary Dressing Zetuvit Plus Silicone Border Sacrum Dressing, Sm, 7x7 (in/in) Discharge Instruction: Apply silicone border over primary dressing as directed. Secured With AIDYN, GUNSALLUS ([id number]) [phone].pdf Page 4 of 11 Compression Wrap Compression Stockings Add-Ons Wound #3 (Calcaneus) Wound Laterality: Right Cleanser Peri-Wound Care Topical Primary Dressing Sorbalgon AG Dressing, 4x4 (in/in) Discharge Instruction: or equivalent silver alginate. Apply to wound bed as instructed Secondary Dressing ALLEVYN Heel 4 1/2in x 5 1/2in / 10.5cm x 13.5cm Discharge Instruction: Apply over primary dressing as directed. Woven Gauze Sponge, Non-Sterile 4x4 in Discharge Instruction: Apply over primary dressing as directed. Secured With [employer], 4.5x3.1 (in/yd) Discharge Instruction: Secure with Kerlix as directed. Paper Tape, 2x10 (in/yd) Discharge Instruction: Secure dressing with tape as directed. Compression Wrap Compression Stockings Add-Ons Electronic Signature(s) Signed: 09/29/2022 7:40:00 AM By: Fredirick Maudlin MD FACS Entered By: Fredirick Maudlin on 09/29/2022 07:40:00 -------------------------------------------------------------------------------- Multi-Disciplinary Care Plan Details Patient Name: Date of  Service: MY, BONGIOVANNI 09/28/2022 2:45 PM Medical Record Number: [id number] Patient Account Number: [id number] Date of Birth/Sex: Treating RN: Jul 17, 1951 (72 y.o. [name] Primary Care Bowyn Mercier: Leretha Pol Other Clinician: Referring Rayaan Lorah: Treating Khaden Gater/Extender: Olin Pia in Treatment: 3 Multidisciplinary Care Plan  reviewed with physician Active Inactive Medication Nursing Diagnoses: Knowledge deficit related to medication safety: actual or potential Goals: Patient/caregiver will demonstrate understanding of all current medications Date Initiated: 09/15/2022 T arget Resolution Date: 10/19/2022 Goal Status: Active Interventions: Assess patient/caregiver ability to manage medication regimen upon admission and as needed MORINE, LINGLEY ([id number]) [phone].pdf Page 5 of 11 Patient/Caregiver given reconciled medication list upon admission, changes in medications and discharge from the [place]: New medication prescribed at [place] : 09/15/2022 Notes: Pressure Nursing Diagnoses: Knowledge deficit related to management of pressures ulcers Goals: Patient will remain free of pressure ulcers Date Initiated: 09/15/2022 Date Inactivated: 09/28/2022 Target Resolution Date: 11/09/2022 Unmet Reason: new pressure ulcer Goal Status: Unmet right heel Patient/caregiver will verbalize risk factors for pressure ulcer development Date Initiated: 09/15/2022 Target Resolution Date: 11/09/2022 Goal Status: Active Interventions: Assess potential for pressure ulcer upon admission and as needed Treatment Activities: Patient referred for pressure reduction/relief devices : 09/15/2022 Pressure reduction/relief device ordered : 09/15/2022 Notes: Wound/Skin Impairment Nursing Diagnoses: Impaired tissue integrity Knowledge deficit related to ulceration/compromised skin integrity Goals: Patient/caregiver will verbalize understanding of skin care regimen Date Initiated: 09/28/2022 Target Resolution Date: 11/02/2022 Goal Status: Active Ulcer/skin breakdown will have a volume reduction of 50% by week 8 Date Initiated: 09/28/2022 Target Resolution Date: 11/02/2022 Goal Status: Active Interventions: Assess patient/caregiver ability to obtain necessary supplies Assess  patient/caregiver ability to perform ulcer/skin care regimen upon admission and as needed Assess ulceration(s) every visit Provide education on ulcer and skin care Treatment Activities: Skin care regimen initiated : 09/28/2022 Topical wound management initiated : 09/28/2022 Notes: Electronic Signature(s) Signed: 09/28/2022 5:36:36 PM By: Baruch Gouty RN, BSN Entered By: Baruch Gouty on 09/28/2022 16:59:00 -------------------------------------------------------------------------------- Pain Assessment Details Patient Name: Date of Service: [name] 09/28/2022 2:45 PM Medical Record Number: [id number] Patient Account Number: [id number] Date of Birth/Sex: Treating RN: 10/11/1950 (72 y.o. [name] Primary Care Camden Mazzaferro: Leretha Pol Other Clinician: Referring Jaynell Castagnola: Treating Mikeala Girdler/Extender: Olin Pia in Treatment: 3 MALYNA, VERZOSA ([id number]) [id number] Page 6 of 11 Active Problems Location of Pain Severity and Description of Pain Patient Has Paino Yes Site Locations Pain Location: Pain in Ulcers With Dressing Change: Yes Duration of the Pain. Constant / Intermittento Intermittent Rate the pain. Current Pain Level: 1 Worst Pain Level: 4 Least Pain Level: 0 Character of Pain Describe the Pain: Aching, Other: sore Pain Management and Medication Current Pain Management: Medication: Yes Is the Current Pain Management Adequate: Adequate How does your wound impact your activities of daily livingo Sleep: No Bathing: No Appetite: No Relationship With Others: No Bladder Continence: No Emotions: No Bowel Continence: No Work: No Toileting: No Drive: No Dressing: No Hobbies: No Electronic Signature(s) Signed: 09/28/2022 5:36:36 PM By: Baruch Gouty RN, BSN Entered By: Baruch Gouty on 09/28/2022  15:09:30 -------------------------------------------------------------------------------- Patient/Caregiver Education Details Patient Name: Date of Service: [name] 2/15/2024andnbsp2:45 PM Medical Record Number: [id number] Patient Account Number: [id number] Date of Birth/Gender: Treating RN: 23-Jul-1951 (72 y.o. [name] Primary Care Physician: Leretha Pol Other Clinician: Referring Physician: Treating Physician/Extender: Olin Pia in Treatment: 3 Education Assessment Education Provided To: Patient Education Topics Provided Pressure: Methods:  Explain/Verbal Responses: Reinforcements needed, State content correctly Wound/Skin Impairment: Methods: Explain/Verbal Responses: Reinforcements needed, State content correctly KHAMARIA, HENES ([id number]) [phone].pdf Page 7 of 11 Electronic Signature(s) Signed: 09/28/2022 5:36:36 PM By: Baruch Gouty RN, BSN Entered By: Baruch Gouty on 09/28/2022 16:59:35 -------------------------------------------------------------------------------- Wound Assessment Details Patient Name: Date of Service: TIMBERLYNN, STANBACK 09/28/2022 2:45 PM Medical Record Number: [id number] Patient Account Number: [id number] Date of Birth/Sex: Treating RN: 1951/04/04 (72 y.o. [name] Primary Care Vail Basista: Leretha Pol Other Clinician: Referring Julia Kulzer: Treating Jacier Gladu/Extender: Olin Pia in Treatment: 3 Wound Status Wound Number: 1 Primary Pressure Ulcer Etiology: Wound Location: Right Gluteus Wound Open Wounding Event: Pressure Injury Status: Date Acquired: 08/11/2022 Comorbid Chronic sinus problems/congestion, Sleep Apnea, Congestive Heart Weeks Of Treatment: 3 History: Failure, Raynauds, Rheumatoid Arthritis, Neuropathy Clustered Wound: No Photos Wound Measurements Length: (cm) 2.8 Width: (cm) 6 Depth: (cm) 5.8 Area: (cm)  13.195 Volume: (cm) 76.529 % Reduction in Area: 40% % Reduction in Volume: 42% Epithelialization: None Tunneling: No Undermining: Yes Starting Position (o'clock): 12 Ending Position (o'clock): 3 Maximum Distance: (cm) 8.2 Wound Description Classification: Category/Stage IV Wound Margin: Distinct, outline attached Exudate Amount: Large Exudate Type: Serosanguineous Exudate Color: red, brown Foul Odor After Cleansing: No Slough/Fibrino Yes Wound Bed Granulation Amount: Large (67-100%) Exposed Structure Granulation Quality: Red Fascia Exposed: No Necrotic Amount: Small (1-33%) Fat Layer (Subcutaneous Tissue) Exposed: Yes Necrotic Quality: Adherent Slough Tendon Exposed: No Muscle Exposed: No Joint Exposed: No Bone Exposed: No [address] ANDRIA, MASLOWSKI E ([id number]) [phone].pdf Page 8 of 11 Texture Color No Abnormalities Noted: Yes No Abnormalities Noted: Yes Moisture Temperature / Pain No Abnormalities Noted: Yes Temperature: No Abnormality Tenderness on Palpation: Yes Treatment Notes Wound #1 (Gluteus) Wound Laterality: Right Cleanser Wound Cleanser Discharge Instruction: Cleanse the wound with wound cleanser prior to applying a clean dressing using gauze sponges, not tissue or cotton balls. Peri-Wound Care Topical Keystone antibiotic compound Discharge Instruction: squirt and spray into base of wound before packing Primary Dressing Dakin's Solution 0.25%, 16 (oz) Discharge Instruction: Moisten gauze with Dakin's solution and pack into wound Secondary Dressing Zetuvit Plus Silicone Border Sacrum Dressing, Sm, 7x7 (in/in) Discharge Instruction: Apply silicone border over primary dressing as directed. Secured With Compression Wrap Compression Stockings Environmental education officer) Signed: 09/28/2022 5:36:36 PM By: Baruch Gouty RN, BSN Entered By: Baruch Gouty on 09/28/2022  15:41:25 -------------------------------------------------------------------------------- Wound Assessment Details Patient Name: Date of Service: [name] 09/28/2022 2:45 PM Medical Record Number: [id number] Patient Account Number: [id number] Date of Birth/Sex: Treating RN: Jan 29, 1951 (72 y.o. [name] Primary Care Melburn Treiber: Leretha Pol Other Clinician: Referring Demya Scruggs: Treating Garnie Borchardt/Extender: Olin Pia in Treatment: 3 Wound Status Wound Number: 2 Primary Pressure Ulcer Etiology: Wound Location: Right, Distal Gluteus Wound Healed - Epithelialized Wounding Event: Pressure Injury Status: Date Acquired: 08/11/2022 Comorbid Chronic sinus problems/congestion, Sleep Apnea, Congestive Heart Weeks Of Treatment: 3 History: Failure, Raynauds, Rheumatoid Arthritis, Neuropathy Clustered Wound: No Photos [name], [name] ([id number]) [phone].pdf Page 9 of 11 Wound Measurements Length: (cm) Width: (cm) Depth: (cm) Area: (cm) Volume: (cm) 0 % Reduction in Area: 100% 0 % Reduction in Volume: 100% 0 Epithelialization: Large (67-100%) 0 Tunneling: No 0 Undermining: No Wound Description Classification: Category/Stage III Exudate Amount: None Present Foul Odor After Cleansing: No Slough/Fibrino No Wound Bed Granulation Amount: None Present (0%) Exposed Structure Necrotic Amount: None Present (0%) Fascia Exposed: No Fat Layer (Subcutaneous Tissue) Exposed: No Tendon Exposed: No Muscle Exposed: No Joint Exposed:  No Bone Exposed: No Periwound Skin Texture Texture Color No Abnormalities Noted: Yes No Abnormalities Noted: Yes Moisture Temperature / Pain No Abnormalities Noted: Yes Tenderness on Palpation: Yes Electronic Signature(s) Signed: 09/28/2022 5:36:36 PM By: Baruch Gouty RN, BSN Entered By: Baruch Gouty on 09/28/2022  15:41:55 -------------------------------------------------------------------------------- Wound Assessment Details Patient Name: Date of Service: [name] 09/28/2022 2:45 PM Medical Record Number: [id number] Patient Account Number: [id number] Date of Birth/Sex: Treating RN: Nov 06, 1950 (72 y.o. [name] Primary Care Natsha Guidry: Leretha Pol Other Clinician: Referring Carsyn Boster: Treating Trica Usery/Extender: Olin Pia in Treatment: 3 Wound Status Wound Number: 3 Primary Pressure Ulcer Etiology: Wound Location: Right Calcaneus Wound Open Wounding Event: Pressure Injury Status: Date Acquired: 09/21/2022 Comorbid Chronic sinus problems/congestion, Sleep Apnea, Congestive Heart Weeks Of Treatment: 0 History: Failure, Raynauds, Rheumatoid Arthritis, Neuropathy Clustered Wound: No Photos [name], [name] ([id number]) [phone].pdf Page 10 of 11 Wound Measurements Length: (cm) 3 Width: (cm) 4.1 Depth: (cm) 0.1 Area: (cm) 9.66 Volume: (cm) 0.966 % Reduction in Area: % Reduction in Volume: Epithelialization: Small (1-33%) Tunneling: No Undermining: No Wound Description Classification: Unstageable/Unclassified Wound Margin: Flat and Intact Exudate Amount: Medium Exudate Type: Serosanguineous Exudate Color: red, brown Foul Odor After Cleansing: No Slough/Fibrino Yes Wound Bed Granulation Amount: None Present (0%) Exposed Structure Necrotic Amount: Large (67-100%) Fascia Exposed: No Necrotic Quality: Adherent Slough Fat Layer (Subcutaneous Tissue) Exposed: Yes Tendon Exposed: No Muscle Exposed: No Joint Exposed: No Bone Exposed: No Periwound Skin Texture Texture Color No Abnormalities Noted: Yes No Abnormalities Noted: Yes Moisture Temperature / Pain No Abnormalities Noted: No Temperature: No Abnormality Dry / Scaly: No Tenderness on Palpation: Yes Maceration: Yes Treatment Notes Wound #3 (Calcaneus)  Wound Laterality: Right Cleanser Peri-Wound Care Topical Primary Dressing Sorbalgon AG Dressing, 4x4 (in/in) Discharge Instruction: or equivalent silver alginate. Apply to wound bed as instructed Secondary Dressing ALLEVYN Heel 4 1/2in x 5 1/2in / 10.5cm x 13.5cm Discharge Instruction: Apply over primary dressing as directed. Woven Gauze Sponge, Non-Sterile 4x4 in Discharge Instruction: Apply over primary dressing as directed. Secured With [employer], 4.5x3.1 (in/yd) Discharge Instruction: Secure with Kerlix as directed. Paper Tape, 2x10 (in/yd) Discharge Instruction: Secure dressing with tape as directed. Compression Wrap Compression Stockings SANJU, [name] ([id number]) [phone].pdf Page 11 of 11 Add-Ons Electronic Signature(s) Signed: 09/28/2022 5:36:36 PM By: Baruch Gouty RN, BSN Entered By: Baruch Gouty on 09/28/2022 15:39:22 -------------------------------------------------------------------------------- Vitals Details Patient Name: Date of Service: [name] 09/28/2022 2:45 PM Medical Record Number: [id number] Patient Account Number: [id number] Date of Birth/Sex: Treating RN: Feb 19, 1951 (72 y.o. [name] Primary Care Grant Swager: Leretha Pol Other Clinician: Referring Alaja Goldinger: Treating Marykathleen Russi/Extender: Olin Pia in Treatment: 3 Vital Signs Time Taken: 15:07 Temperature (F): 97.6 Height (in): 62 Pulse (bpm): 67 Weight (lbs): 207 Respiratory Rate (breaths/min): 20 Body Mass Index (BMI): 37.9 Blood Pressure (mmHg): 95/62 Reference Range: 80 - 120 mg / dl Electronic Signature(s) Signed: 09/28/2022 5:36:36 PM By: Baruch Gouty RN, BSN Entered By: Baruch Gouty on 09/28/2022 15:08:40

## 2022-09-30 NOTE — Progress Notes (Signed)
[name], [name] ([id number]) [phone].pdf Page 1 of 11 Visit Report for 09/28/2022 Chief Complaint Document Details Patient Name: Date of Service: [name], [name] 09/28/2022 2:45 PM Medical Record Number: [id number] Patient Account Number: [id number] Date of Birth/Sex: Treating RN: 1950-08-19 (72 y.o. F) Primary Care Provider: Leretha Pol Other Clinician: Referring Provider: Treating Provider/Extender: Olin Pia in Treatment: 3 Information Obtained from: Patient Chief Complaint Patient is at the clinic for treatment of open pressure ulcers Electronic Signature(s) Signed: 09/29/2022 7:40:06 AM By: Fredirick Maudlin MD FACS Entered By: Fredirick Maudlin on 09/29/2022 07:40:06 -------------------------------------------------------------------------------- Debridement Details Patient Name: Date of Service: [name] 09/28/2022 2:45 PM Medical Record Number: [id number] Patient Account Number: [id number] Date of Birth/Sex: Treating RN: 16-Oct-1950 (72 y.o. [name] Primary Care Provider: Leretha Pol Other Clinician: Referring Provider: Treating Provider/Extender: Olin Pia in Treatment: 3 Debridement Performed for Assessment: Wound #3 Right Calcaneus Performed By: Physician Fredirick Maudlin, MD Debridement Type: Debridement Level of Consciousness (Pre-procedure): Awake and Alert Pre-procedure Verification/Time Out Yes - 15:45 Taken: Start Time: 15:46 Pain Control: Lidocaine 4% T opical Solution T Area Debrided (L x W): otal 3 (cm) x 4.1 (cm) = 12.3 (cm) Tissue and other material debrided: Non-Viable, Slough, Skin: Epidermis, Slough Level: Skin/Epidermis Debridement Description: Selective/Open Wound Instrument: Curette Bleeding: Minimum Hemostasis Achieved: Pressure Procedural Pain: 3 Post Procedural Pain: 1 Response to Treatment: Procedure was tolerated well Level of  Consciousness (Post- Awake and Alert procedure): Post Debridement Measurements of Total Wound Length: (cm) 3 Stage: Unstageable/Unclassified Width: (cm) 4.1 Depth: (cm) 0.1 Volume: (cm) 0.966 Character of Wound/Ulcer Post Debridement: Requires Further Debridement Post Procedure Diagnosis Same as [name], [name] ([id number] Page 2 of 11 Notes Scribed for Dr. Celine Ahr by Baruch Gouty, RN Electronic Signature(s) Signed: 09/28/2022 4:10:50 PM By: Fredirick Maudlin MD FACS Signed: 09/28/2022 5:36:36 PM By: Baruch Gouty RN, BSN Entered By: Baruch Gouty on 09/28/2022 15:54:12 -------------------------------------------------------------------------------- Debridement Details Patient Name: Date of Service: [name] 09/28/2022 2:45 PM Medical Record Number: [id number] Patient Account Number: [id number] Date of Birth/Sex: Treating RN: 17-Dec-1950 (72 y.o. [name], [name] Primary Care Provider: Leretha Pol Other Clinician: Referring Provider: Treating Provider/Extender: Olin Pia in Treatment: 3 Debridement Performed for Assessment: Wound #1 Right Gluteus Performed By: Physician Fredirick Maudlin, MD Debridement Type: Debridement Level of Consciousness (Pre-procedure): Awake and Alert Pre-procedure Verification/Time Out Yes - 15:45 Taken: Start Time: 15:46 Pain Control: Lidocaine 4% T opical Solution T Area Debrided (L x W): otal 2.8 (cm) x 2 (cm) = 5.6 (cm) Tissue and other material debrided: Viable, Non-Viable, Muscle, Slough, Subcutaneous, Slough Level: Skin/Subcutaneous Tissue/Muscle Debridement Description: Excisional Instrument: Curette Bleeding: Minimum Hemostasis Achieved: Pressure Procedural Pain: 6 Post Procedural Pain: 3 Response to Treatment: Procedure was tolerated well Level of Consciousness (Post- Awake and Alert procedure): Post Debridement Measurements of Total  Wound Length: (cm) 2.8 Stage: Category/Stage IV Width: (cm) 6 Depth: (cm) 5.8 Volume: (cm) 76.529 Character of Wound/Ulcer Post Debridement: Improved Post Procedure Diagnosis Same as Pre-procedure Notes Scribed for Dr. Celine Ahr by Baruch Gouty, RN Electronic Signature(s) Signed: 09/28/2022 4:10:50 PM By: Fredirick Maudlin MD FACS Signed: 09/28/2022 5:36:36 PM By: Baruch Gouty RN, BSN Entered By: Baruch Gouty on 09/28/2022 15:55:47 HPI Details -------------------------------------------------------------------------------- [name] ([id number]) [id number] Page 3 of 11 Patient Name: Date of Service: [name], [name] 09/28/2022 2:45 PM Medical Record Number: [id number] Patient Account Number: [id number] Date of Birth/Sex: Treating RN: Apr 20, 1951 (72 y.o. F)  Primary Care Provider: Leretha Pol Other Clinician: Referring Provider: Treating Provider/Extender: Olin Pia in Treatment: 3 History of Present Illness HPI Description: ADMISSION 09/07/2023 This is a 72 year old woman with a past medical history notable for obesity, congestive heart failure, Raynaud's syndrome, CKD stage IIIb, osteoporosis, and rheumatoid arthritis. In November 2023, she suffered a fall that resulted in a femur fracture. She was hospitalized for about a week and underwent surgical repair of the fracture. She subsequently developed pressure ulcers on her right buttocks and ischium. She has been receiving home health services and they have been applying Medihoney. They have been reporting the wounds as stage II, but on evaluation, the large ulcer was probably unstageable at the time they were evaluating it but it is clearly a stage IV at this point. The smaller ulcer does have fat layer exposure and therefore is a stage III. The patient is accompanied by her daughter. She says she has been sleeping on a regular bed but recently ordered an air  mattress T opper, but does not have it yet. She is on Ozempic for weight loss and therefore has a poor appetite and struggles to get adequate protein intake. 09/15/2022: The large stage IV ulcer is substantially cleaner this week. The stage III ulcer is a little smaller. Both have slough accumulation. The culture that I took last week was polymicrobial. The Augmentin that I prescribed was adequate coverage for the species and she continues to take this. We have also ordered Keystone topical antibiotic compound, but this has not yet arrived. 09/21/2022: The stage IV ulcer has some necrotic muscle at the base but is otherwise fairly clean. The stage III ulcer is smaller again with light slough on the surface. She has her Keystone topical antibiotic compound with her today. 09/29/2022: There is still some necrotic muscle at the base of the stage IV ulcer that I was unable to get to last week. The stage III ulcer has healed. She unfortunately has developed a new pressure ulcer on her right heel. Electronic Signature(s) Signed: 09/29/2022 7:42:36 AM By: Fredirick Maudlin MD FACS Entered By: Fredirick Maudlin on 09/29/2022 07:42:35 -------------------------------------------------------------------------------- Physical Exam Details Patient Name: Date of Service: [name] 09/28/2022 2:45 PM Medical Record Number: [id number] Patient Account Number: [id number] Date of Birth/Sex: Treating RN: 1950/10/02 (72 y.o. F) Primary Care Provider: Leretha Pol Other Clinician: Referring Provider: Treating Provider/Extender: Olin Pia in Treatment: 3 Constitutional Hypotensive, but normal for this patient. . . . no acute distress. Respiratory . Notes 09/29/2022: There is still some necrotic muscle at the base of the stage IV ulcer that I was unable to get to last week. The stage III ulcer has healed. She unfortunately has developed a new pressure ulcer on her right  heel. Electronic Signature(s) Signed: 09/29/2022 7:43:02 AM By: Fredirick Maudlin MD FACS Entered By: Fredirick Maudlin on 09/29/2022 07:43:02 -------------------------------------------------------------------------------- Physician Orders Details Patient Name: Date of Service: [name] 09/28/2022 2:45 PM Medical Record Number: [id number] Patient Account Number: [id number] [name], [name] ([id number]) [phone].pdf Page 4 of 11 Date of Birth/Sex: Treating RN: 05-07-1951 (72 y.o. [name] Primary Care Provider: Other Clinician: Leretha Pol Referring Provider: Treating Provider/Extender: Olin Pia in Treatment: 3 Verbal / Phone Orders: No Diagnosis Coding ICD-10 Coding Code Description L89.314 Pressure ulcer of right buttock, stage 4 L89.610 Pressure ulcer of right heel, unstageable I73.00 Raynaud's syndrome without gangrene 99991111 Chronic diastolic (congestive) heart failure N18.32 Chronic kidney disease,  stage 3b R73.02 Impaired glucose tolerance (oral) E66.9 Obesity, unspecified Follow-up Appointments ppointment in 1 week. - Dr. Celine Ahr Return A Friday 10/06/22 at 10:15 am RM 3 Thursday 2/29 @ 1:15 pm RM 1 Anesthetic Wound #1 Right Gluteus (In clinic) Topical Lidocaine 5% applied to wound bed (In clinic) Topical Lidocaine 4% applied to wound bed Wound #3 Right Calcaneus (In clinic) Topical Lidocaine 5% applied to wound bed (In clinic) Topical Lidocaine 4% applied to wound bed Bathing/ Shower/ Hygiene May shower and wash wound with soap and water. Off-Loading Low air-loss mattress (Group 2) - ordered from [facility] needs hospital bed Turn and reposition every 2 hours - avoid lying on back, stand at least every hour while out of bed, float heels off bed with pillows under calves while in bed Prevalon Boot - righ tfoot Additional Orders / Instructions Follow Nutritious Diet - increase protein to 70-80 gms  per day, hold ozempic Home Health New wound care orders this week; continue Home Health for wound care. May utilize formulary equivalent dressing for wound treatment orders unless otherwise specified. - see orders Other Home Health Orders/Instructions: - [facility] Wound Treatment Wound #1 - Gluteus Wound Laterality: Right Cleanser: Wound Cleanser 1 x Per Day/30 Days Discharge Instructions: Cleanse the wound with wound cleanser prior to applying a clean dressing using gauze sponges, not tissue or cotton balls. Topical: Keystone antibiotic compound 1 x Per Day/30 Days Discharge Instructions: squirt and spray into base of wound before packing Prim Dressing: Dakin's Solution 0.25%, 16 (oz) 1 x Per Day/30 Days ary Discharge Instructions: Moisten gauze with Dakin's solution and pack into wound Secondary Dressing: Zetuvit Plus Silicone Border Sacrum Dressing, Sm, 7x7 (in/in) 1 x Per Day/30 Days Discharge Instructions: Apply silicone border over primary dressing as directed. Wound #3 - Calcaneus Wound Laterality: Right Prim Dressing: Sorbalgon [employer] Dressing, 4x4 (in/in) 3 x Per Week/30 Days ary Discharge Instructions: or equivalent silver alginate. Apply to wound bed as instructed Secondary Dressing: ALLEVYN Heel 4 1/2in x 5 1/2in / 10.5cm x 13.5cm 3 x Per Week/30 Days Discharge Instructions: Apply over primary dressing as directed. Secondary Dressing: Woven Gauze Sponge, Non-Sterile 4x4 in 3 x Per Week/30 Days Discharge Instructions: Apply over primary dressing as directed. Secured With: [employer], 4.5x3.1 (in/yd) 3 x Per Week/30 Days Discharge Instructions: Secure with Kerlix as directed. [name], [name] ([id number]) [phone].pdf Page 5 of 11 Secured With: Paper Tape, 2x10 (in/yd) 3 x Per Week/30 Days Discharge Instructions: Secure dressing with tape as directed. Electronic Signature(s) Signed: 09/29/2022 7:45:54 AM By: Fredirick Maudlin MD FACS Previous  Signature: 09/28/2022 4:10:50 PM Version By: Fredirick Maudlin MD FACS Previous Signature: 09/28/2022 5:36:36 PM Version By: Baruch Gouty RN, BSN Entered By: Fredirick Maudlin on 09/29/2022 07:43:29 -------------------------------------------------------------------------------- Problem List Details Patient Name: Date of Service: [name] 09/28/2022 2:45 PM Medical Record Number: [id number] Patient Account Number: [id number] Date of Birth/Sex: Treating RN: 25-Nov-1950 (72 y.o. [name] Primary Care Provider: Leretha Pol Other Clinician: Referring Provider: Treating Provider/Extender: Olin Pia in Treatment: 3 Active Problems ICD-10 Encounter Code Description Active Date MDM Diagnosis L89.314 Pressure ulcer of right buttock, stage 4 09/07/2022 No Yes L89.610 Pressure ulcer of right heel, unstageable 09/28/2022 No Yes I73.00 Raynaud's syndrome without gangrene 09/07/2022 No Yes [id number] Chronic diastolic (congestive) heart failure 09/07/2022 No Yes N18.32 Chronic kidney disease, stage 3b 09/07/2022 No Yes R73.02 Impaired glucose tolerance (oral) 09/07/2022 No Yes E66.9 Obesity, unspecified 09/07/2022 No Yes Inactive Problems ICD-10 Code Description  Active Date Inactive Date L89.313 Pressure ulcer of right buttock, stage 3 09/07/2022 09/07/2022 Resolved Problems Electronic Signature(s) [name], [name] ([id number]) [id number].pdf Page 6 of 11 Signed: 09/29/2022 7:39:47 AM By: Fredirick Maudlin MD FACS Previous Signature: 09/28/2022 5:36:36 PM Version By: Baruch Gouty RN, BSN Entered By: Fredirick Maudlin on 09/29/2022 07:39:47 -------------------------------------------------------------------------------- Progress Note Details Patient Name: Date of Service: [name] 09/28/2022 2:45 PM Medical Record Number: [id number] Patient Account Number: [id number] Date of Birth/Sex: Treating RN: [birth year] (72 y.o.  F) Primary Care Provider: Leretha Pol Other Clinician: Referring Provider: Treating Provider/Extender: Olin Pia in Treatment: 3 Subjective Chief Complaint Information obtained from Patient Patient is at the clinic for treatment of open pressure ulcers History of Present Illness (HPI) ADMISSION 09/07/2023 This is a 72 year old woman with a past medical history notable for obesity, congestive heart failure, Raynaud's syndrome, CKD stage IIIb, osteoporosis, and rheumatoid arthritis. In November 2023, she suffered a fall that resulted in a femur fracture. She was hospitalized for about a week and underwent surgical repair of the fracture. She subsequently developed pressure ulcers on her right buttocks and ischium. She has been receiving home health services and they have been applying Medihoney. They have been reporting the wounds as stage II, but on evaluation, the large ulcer was probably unstageable at the time they were evaluating it but it is clearly a stage IV at this point. The smaller ulcer does have fat layer exposure and therefore is a stage III. The patient is accompanied by her daughter. She says she has been sleeping on a regular bed but recently ordered an air mattress T opper, but does not have it yet. She is on Ozempic for weight loss and therefore has a poor appetite and struggles to get adequate protein intake. 09/15/2022: The large stage IV ulcer is substantially cleaner this week. The stage III ulcer is a little smaller. Both have slough accumulation. The culture that I took last week was polymicrobial. The Augmentin that I prescribed was adequate coverage for the species and she continues to take this. We have also ordered Keystone topical antibiotic compound, but this has not yet arrived. 09/21/2022: The stage IV ulcer has some necrotic muscle at the base but is otherwise fairly clean. The stage III ulcer is smaller again with light slough on  the surface. She has her Keystone topical antibiotic compound with her today. 09/29/2022: There is still some necrotic muscle at the base of the stage IV ulcer that I was unable to get to last week. The stage III ulcer has healed. She unfortunately has developed a new pressure ulcer on her right heel. Patient History Information obtained from Patient, Caregiver, Chart. Family History Cancer - Father,Paternal Grandparents, Heart Disease - Mother,Father,Paternal Grandparents, Hypertension - Mother, Stroke - Maternal Grandparents, No family history of Diabetes, Hereditary Spherocytosis, Kidney Disease, Lung Disease, Seizures, Thyroid Problems, Tuberculosis. 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 Medical History Eyes Denies history of Cataracts, Glaucoma, Optic Neuritis Ear/Nose/Mouth/Throat Patient has history of Chronic sinus problems/congestion - chronic rhinitis Denies history of Middle ear problems Respiratory Patient has history of Sleep Apnea - uses CPAP Cardiovascular Patient has history of Congestive Heart Failure Endocrine Denies history of Type I Diabetes, Type II Diabetes Genitourinary Denies history of End Stage Renal Disease Immunological Patient has history of Raynaudoos Denies history of Lupus Erythematosus, Scleroderma Integumentary (Skin) Denies history  of History of Burn Musculoskeletal Patient has history of Rheumatoid Arthritis Denies history of Gout, Osteoarthritis, Osteomyelitis Neurologic Patient has history of Neuropathy Denies history of Dementia, Quadriplegia, Paraplegia, Seizure Disorder Oncologic Denies history of Received Chemotherapy, Received Radiation [name], [name] ([id number]) [phone].pdf Page 7 of 11 Psychiatric Denies history of Anorexia/bulimia, Confinement Anxiety Hospitalization/Surgery History - right femur fx  [name]. - right knee replacement. - left breast mass excision. - left ankle tibia repair. - abdominal hysterectomy. - adnoidectomy/tonsillectomy. - cholecystectomy. - dental implants. - patoid cystectomy. - tubal ligation. Medical A Surgical History Notes nd Constitutional Symptoms (General Health) morbid obesity Cardiovascular hyperlipidemia Gastrointestinal GERD, IBS, eosinophilic esophagitis, diverticulosis, Genitourinary CKD stage 3 Immunological Sjoegren syndrome, fibromyalgia Objective Constitutional Hypotensive, but normal for this patient. no acute distress. Vitals Time Taken: 3:07 PM, Height: 62 in, Weight: 207 lbs, BMI: 37.9, Temperature: 97.6 F, Pulse: 67 bpm, Respiratory Rate: 20 breaths/min, Blood Pressure: 95/62 mmHg. General Notes: 09/29/2022: There is still some necrotic muscle at the base of the stage IV ulcer that I was unable to get to last week. The stage III ulcer has healed. She unfortunately has developed a new pressure ulcer on her right heel. Integumentary (Hair, Skin) Wound #1 status is Open. Original cause of wound was Pressure Injury. The date acquired was: 08/11/2022. The wound has been in treatment 3 weeks. The wound is located on the Right Gluteus. The wound measures 2.8cm length x 6cm width x 5.8cm depth; 13.195cm^2 area and 76.529cm^3 volume. There is Fat Layer (Subcutaneous Tissue) exposed. There is no tunneling noted, however, there is undermining starting at 12:00 and ending at 3:00 with a maximum distance of 8.2cm. There is a large amount of serosanguineous drainage noted. The wound margin is distinct with the outline attached to the wound base. There is large (67-100%) red granulation within the wound bed. There is a small (1-33%) amount of necrotic tissue within the wound bed including Adherent Slough. The periwound skin appearance had no abnormalities noted for texture. The periwound skin appearance had no abnormalities noted for moisture. The  periwound skin appearance had no abnormalities noted for color. Periwound temperature was noted as No Abnormality. The periwound has tenderness on palpation. Wound #2 status is Healed - Epithelialized. Original cause of wound was Pressure Injury. The date acquired was: 08/11/2022. The wound has been in treatment 3 weeks. The wound is located on the Right,Distal Gluteus. The wound measures 0cm length x 0cm width x 0cm depth; 0cm^2 area and 0cm^3 volume. There is no tunneling or undermining noted. There is a none present amount of drainage noted. There is no granulation within the wound bed. There is no necrotic tissue within the wound bed. The periwound skin appearance had no abnormalities noted for texture. The periwound skin appearance had no abnormalities noted for moisture. The periwound skin appearance had no abnormalities noted for color. The periwound has tenderness on palpation. Wound #3 status is Open. Original cause of wound was Pressure Injury. The date acquired was: 09/21/2022. The wound is located on the Right Calcaneus. The wound measures 3cm length x 4.1cm width x 0.1cm depth; 9.66cm^2 area and 0.966cm^3 volume. There is Fat Layer (Subcutaneous Tissue) exposed. There is no tunneling or undermining noted. There is a medium amount of serosanguineous drainage noted. The wound margin is flat and intact. There is no granulation within the wound bed. There is a large (67-100%) amount of necrotic tissue within the wound bed including Adherent Slough. The periwound skin appearance had no abnormalities noted for  texture. The periwound skin appearance had no abnormalities noted for color. The periwound skin appearance exhibited: Maceration. The periwound skin appearance did not exhibit: Dry/Scaly. Periwound temperature was noted as No Abnormality. The periwound has tenderness on palpation. Assessment Active Problems ICD-10 Pressure ulcer of right buttock, stage 4 Pressure ulcer of right heel,  unstageable Raynaud's syndrome without gangrene Chronic diastolic (congestive) heart failure Chronic kidney disease, stage 3b Impaired glucose tolerance (oral) Obesity, unspecified Procedures Wound #1 Pre-procedure diagnosis of Wound #1 is a Pressure Ulcer located on the Right Gluteus . There was a Excisional Skin/Subcutaneous Tissue/Muscle Debridement [name], [name] ([id number]) [phone].pdf Page 8 of 11 with a total area of 5.6 sq cm performed by Fredirick Maudlin, MD. With the following instrument(s): Curette to remove Viable and Non-Viable tissue/material. Material removed includes Muscle, Subcutaneous Tissue, and Slough after achieving pain control using Lidocaine 4% T opical Solution. No specimens were taken. A time out was conducted at 15:45, prior to the start of the procedure. A Minimum amount of bleeding was controlled with Pressure. The procedure was tolerated well with a pain level of 6 throughout and a pain level of 3 following the procedure. Post Debridement Measurements: 2.8cm length x 6cm width x 5.8cm depth; 76.529cm^3 volume. Post debridement Stage noted as Category/Stage IV. Character of Wound/Ulcer Post Debridement is improved. Post procedure Diagnosis Wound #1: Same as Pre-Procedure General Notes: Scribed for Dr. Celine Ahr by Baruch Gouty, RN. Wound #3 Pre-procedure diagnosis of Wound #3 is a Pressure Ulcer located on the Right Calcaneus . There was a Selective/Open Wound Skin/Epidermis Debridement with a total area of 12.3 sq cm performed by Fredirick Maudlin, MD. With the following instrument(s): Curette to remove Non-Viable tissue/material. Material removed includes [facility] and Skin: Epidermis and after achieving pain control using Lidocaine 4% T opical Solution. No specimens were taken. A time out was conducted at 15:45, prior to the start of the procedure. A Minimum amount of bleeding was controlled with Pressure. The procedure was tolerated well  with a pain level of 3 throughout and a pain level of 1 following the procedure. Post Debridement Measurements: 3cm length x 4.1cm width x 0.1cm depth; 0.966cm^3 volume. Post debridement Stage noted as Unstageable/Unclassified. Character of Wound/Ulcer Post Debridement requires further debridement. Post procedure Diagnosis Wound #3: Same as Pre-Procedure General Notes: Scribed for Dr. Celine Ahr by Baruch Gouty, RN. Plan Follow-up Appointments: Return Appointment in 1 week. - Dr. Celine Ahr Friday 10/06/22 at 10:15 am RM 3 Thursday 2/29 @ 1:15 pm RM 1 Anesthetic: Wound #1 Right Gluteus: (In clinic) Topical Lidocaine 5% applied to wound bed (In clinic) Topical Lidocaine 4% applied to wound bed Wound #3 Right Calcaneus: (In clinic) Topical Lidocaine 5% applied to wound bed (In clinic) Topical Lidocaine 4% applied to wound bed Bathing/ Shower/ Hygiene: May shower and wash wound with soap and water. Off-Loading: Low air-loss mattress (Group 2) - ordered from [employer] needs hospital bed Turn and reposition every 2 hours - avoid lying on back, stand at least every hour while out of bed, float heels off bed with pillows under calves while in bed Prevalon Boot - righ tfoot Additional Orders / Instructions: Follow Nutritious Diet - increase protein to 70-80 gms per day, hold ozempic Home Health: New wound care orders this week; continue Home Health for wound care. May utilize formulary equivalent dressing for wound treatment orders unless otherwise specified. - see orders Other Home Health Orders/Instructions: - [facility] WOUND #1: - Gluteus Wound Laterality: Right Cleanser: Wound Cleanser 1 x Per Day/30  Days Discharge Instructions: Cleanse the wound with wound cleanser prior to applying a clean dressing using gauze sponges, not tissue or cotton balls. Topical: Keystone antibiotic compound 1 x Per Day/30 Days Discharge Instructions: squirt and spray into base of wound before packing Prim Dressing:  Dakin's Solution 0.25%, 16 (oz) 1 x Per Day/30 Days ary Discharge Instructions: Moisten gauze with Dakin's solution and pack into wound Secondary Dressing: Zetuvit Plus Silicone Border Sacrum Dressing, Sm, 7x7 (in/in) 1 x Per Day/30 Days Discharge Instructions: Apply silicone border over primary dressing as directed. WOUND #3: - Calcaneus Wound Laterality: Right Prim Dressing: Sorbalgon AG Dressing, 4x4 (in/in) 3 x Per Week/30 Days ary Discharge Instructions: or equivalent silver alginate. Apply to wound bed as instructed Secondary Dressing: ALLEVYN Heel 4 1/2in x 5 1/2in / 10.5cm x 13.5cm 3 x Per Week/30 Days Discharge Instructions: Apply over primary dressing as directed. Secondary Dressing: Woven Gauze Sponge, Non-Sterile 4x4 in 3 x Per Week/30 Days Discharge Instructions: Apply over primary dressing as directed. Secured With: [employer], 4.5x3.1 (in/yd) 3 x Per Week/30 Days Discharge Instructions: Secure with Kerlix as directed. Secured With: Paper T ape, 2x10 (in/yd) 3 x Per Week/30 Days Discharge Instructions: Secure dressing with tape as directed. 09/29/2022: There is still some necrotic muscle at the base of the stage IV ulcer that I was unable to get to last week. The stage III ulcer has healed. She unfortunately has developed a new pressure ulcer on her right heel. I used scissors and forceps to debride the necrotic muscle from the stage IV ulcer. I used scissors and forceps to remove the overlying dead skin from the heel ulcer. It looks like it is currently a stage II, but the degree of deep tissue injury may progress and resulted in a stage III ulcer. We will continue to use Keystone topical antibiotic compound and Dakin's moist gauze packing to the gluteal ulcer. We will apply silver alginate to the heel and use a heel cup and Prevalon boot. Instructions for floating that heel were discussed with the patient and her daughter. They will follow-up in 1 week. Electronic  Signature(s) Signed: 09/29/2022 7:45:00 AM By: Fredirick Maudlin MD FACS Entered By: Fredirick Maudlin on 09/29/2022 07:45:00 [name] ([id number] Page 9 of 11 -------------------------------------------------------------------------------- HxROS Details Patient Name: Date of Service: [name], [name] 09/28/2022 2:45 PM Medical Record Number: [id number] Patient Account Number: [id number] Date of Birth/Sex: Treating RN: 08/22/1950 (72 y.o. F) Primary Care Provider: Leretha Pol Other Clinician: Referring Provider: Treating Provider/Extender: Olin Pia in Treatment: 3 Information Obtained From Patient Caregiver Chart Constitutional Symptoms (General Health) Medical History: Past Medical History Notes: morbid obesity Eyes Medical History: Negative for: Cataracts; Glaucoma; Optic Neuritis Ear/Nose/Mouth/Throat Medical History: Positive for: Chronic sinus problems/congestion - chronic rhinitis Negative for: Middle ear problems Respiratory Medical History: Positive for: Sleep Apnea - uses CPAP Cardiovascular Medical History: Positive for: Congestive Heart Failure Past Medical History Notes: hyperlipidemia Gastrointestinal Medical History: Past Medical History Notes: GERD, IBS, eosinophilic esophagitis, diverticulosis, Endocrine Medical History: Negative for: Type I Diabetes; Type II Diabetes Genitourinary Medical History: Negative for: End Stage Renal Disease Past Medical History Notes: CKD stage 3 Immunological Medical History: Positive for: Raynauds Negative for: Lupus Erythematosus; Scleroderma Past Medical History Notes: Sjoegren syndrome, fibromyalgia Integumentary (Skin) Medical History: Negative for: History of Burn [name], [name] ([id number]) [id number] Page 10 of 11 Musculoskeletal Medical History: Positive for: Rheumatoid Arthritis Negative for: Gout;  Osteoarthritis; Osteomyelitis Neurologic Medical History: Positive for:  Neuropathy Negative for: Dementia; Quadriplegia; Paraplegia; Seizure Disorder Oncologic Medical History: Negative for: Received Chemotherapy; Received Radiation Psychiatric Medical History: Negative for: Anorexia/bulimia; Confinement Anxiety HBO Extended History Items Ear/Nose/Mouth/Throat: Chronic sinus problems/congestion Immunizations Pneumococcal Vaccine: Received Pneumococcal Vaccination: Yes Received Pneumococcal Vaccination On or After 60th Birthday: Yes Implantable Devices None Hospitalization / Surgery History Type of Hospitalization/Surgery right femur fx [name] right knee replacement left breast mass excision left ankle tibia repair abdominal hysterectomy adnoidectomy/tonsillectomy cholecystectomy dental implants patoid cystectomy tubal ligation Family and Social History Cancer: Yes - Father,Paternal Grandparents; Diabetes: No; Heart Disease: Yes - Mother,Father,Paternal Grandparents; Hereditary Spherocytosis: No; Hypertension: Yes - Mother; Kidney Disease: No; Lung Disease: No; Seizures: No; Stroke: Yes - Maternal Grandparents; Thyroid Problems: No; Tuberculosis: No; Never smoker; Marital Status - Widowed; Alcohol Use: Rarely; Drug Use: Prior History - TCH; Caffeine Use: Daily - soda, tea; Financial Concerns: No; Food, Clothing or Shelter Needs: No; Support System Lacking: No; Transportation Concerns: No Electronic Signature(s) Signed: 09/29/2022 7:45:54 AM By: Fredirick Maudlin MD FACS Entered By: Fredirick Maudlin on 09/29/2022 07:42:41 -------------------------------------------------------------------------------- SuperBill Details Patient Name: Date of Service: [name] 09/28/2022 Medical Record Number: [id number] Patient Account Number: [id number] Date of Birth/Sex: Treating RN: Dec 26, 1950 (72 y.o. 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) E66.9 Obesity, unspecified Facility Procedures : CPT4 Code: [id number] Description: [id number] - DEB MUSC/FASCIA 20 SQ CM/< ICD-10 Diagnosis Description L89.314 Pressure ulcer of right buttock, stage 4 Modifier: Quantity: 1 : CPT4 Code: [id number] Description: 7597 - DEBRIDE WOUND 1ST 20 SQ CM OR < ICD-10 Diagnosis Description L89.610 Pressure ulcer of right heel, unstageable Modifier: Quantity: 1 Physician Procedures : CPT4 Code Description Modifier [id number] - WC PHYS LEVEL 4 - EST PT 25 ICD-10 Diagnosis Description L89.314 Pressure ulcer of right buttock, stage 4 L89.610 Pressure ulcer of right heel, unstageable [id number] Chronic diastolic (congestive) heart  failure N18.32 Chronic kidney disease, stage 3b Quantity: 1 : [id number] - WC PHYS DEBR MUSCLE/FASCIA 20 SQ CM ICD-10 Diagnosis Description L89.314 Pressure ulcer of right buttock, stage 4 Quantity: 1 : [id number] - WC PHYS DEBR WO ANESTH 20 SQ CM ICD-10 Diagnosis Description L89.610 Pressure ulcer of right heel, unstageable Quantity: 1 Electronic Signature(s) Signed: 09/29/2022 7:45:30 AM By: Fredirick Maudlin MD FACS Previous Signature: 09/28/2022 5:36:36 PM Version By: Baruch Gouty RN, BSN Entered By: Fredirick Maudlin on 09/29/2022 07:45:30

## 2022-10-02 ENCOUNTER — Ambulatory Visit (INDEPENDENT_AMBULATORY_CARE_PROVIDER_SITE_OTHER): Payer: PPO | Admitting: Family Medicine

## 2022-10-03 ENCOUNTER — Telehealth (INDEPENDENT_AMBULATORY_CARE_PROVIDER_SITE_OTHER): Payer: PPO | Admitting: Family Medicine

## 2022-10-03 ENCOUNTER — Telehealth: Payer: Self-pay | Admitting: *Deleted

## 2022-10-03 ENCOUNTER — Encounter (INDEPENDENT_AMBULATORY_CARE_PROVIDER_SITE_OTHER): Payer: Self-pay | Admitting: Family Medicine

## 2022-10-03 DIAGNOSIS — Z7985 Long-term (current) use of injectable non-insulin antidiabetic drugs: Secondary | ICD-10-CM | POA: Diagnosis not present

## 2022-10-03 DIAGNOSIS — G8929 Other chronic pain: Secondary | ICD-10-CM | POA: Diagnosis not present

## 2022-10-03 DIAGNOSIS — E669 Obesity, unspecified: Secondary | ICD-10-CM

## 2022-10-03 DIAGNOSIS — Z6837 Body mass index (BMI) 37.0-37.9, adult: Secondary | ICD-10-CM | POA: Diagnosis not present

## 2022-10-03 DIAGNOSIS — E7849 Other hyperlipidemia: Secondary | ICD-10-CM

## 2022-10-03 DIAGNOSIS — M545 Low back pain, unspecified: Secondary | ICD-10-CM | POA: Diagnosis not present

## 2022-10-03 DIAGNOSIS — J329 Chronic sinusitis, unspecified: Secondary | ICD-10-CM | POA: Diagnosis not present

## 2022-10-03 DIAGNOSIS — Z9181 History of falling: Secondary | ICD-10-CM | POA: Diagnosis not present

## 2022-10-03 DIAGNOSIS — R7302 Impaired glucose tolerance (oral): Secondary | ICD-10-CM | POA: Diagnosis not present

## 2022-10-03 DIAGNOSIS — Z7982 Long term (current) use of aspirin: Secondary | ICD-10-CM | POA: Diagnosis not present

## 2022-10-03 DIAGNOSIS — M9701XD Periprosthetic fracture around internal prosthetic right hip joint, subsequent encounter: Secondary | ICD-10-CM | POA: Diagnosis not present

## 2022-10-03 DIAGNOSIS — L89612 Pressure ulcer of right heel, stage 2: Secondary | ICD-10-CM | POA: Diagnosis not present

## 2022-10-03 DIAGNOSIS — I5032 Chronic diastolic (congestive) heart failure: Secondary | ICD-10-CM | POA: Diagnosis not present

## 2022-10-03 DIAGNOSIS — E1122 Type 2 diabetes mellitus with diabetic chronic kidney disease: Secondary | ICD-10-CM | POA: Diagnosis not present

## 2022-10-03 DIAGNOSIS — M80051D Age-related osteoporosis with current pathological fracture, right femur, subsequent encounter for fracture with routine healing: Secondary | ICD-10-CM | POA: Diagnosis not present

## 2022-10-03 DIAGNOSIS — L89314 Pressure ulcer of right buttock, stage 4: Secondary | ICD-10-CM | POA: Diagnosis not present

## 2022-10-03 DIAGNOSIS — L89313 Pressure ulcer of right buttock, stage 3: Secondary | ICD-10-CM | POA: Diagnosis not present

## 2022-10-03 DIAGNOSIS — K219 Gastro-esophageal reflux disease without esophagitis: Secondary | ICD-10-CM | POA: Diagnosis not present

## 2022-10-03 DIAGNOSIS — L89154 Pressure ulcer of sacral region, stage 4: Secondary | ICD-10-CM

## 2022-10-03 DIAGNOSIS — M3500 Sicca syndrome, unspecified: Secondary | ICD-10-CM | POA: Diagnosis not present

## 2022-10-03 DIAGNOSIS — L89310 Pressure ulcer of right buttock, unstageable: Secondary | ICD-10-CM | POA: Diagnosis not present

## 2022-10-03 DIAGNOSIS — I130 Hypertensive heart and chronic kidney disease with heart failure and stage 1 through stage 4 chronic kidney disease, or unspecified chronic kidney disease: Secondary | ICD-10-CM | POA: Diagnosis not present

## 2022-10-03 DIAGNOSIS — G4733 Obstructive sleep apnea (adult) (pediatric): Secondary | ICD-10-CM | POA: Diagnosis not present

## 2022-10-03 DIAGNOSIS — M069 Rheumatoid arthritis, unspecified: Secondary | ICD-10-CM | POA: Diagnosis not present

## 2022-10-03 DIAGNOSIS — N1832 Chronic kidney disease, stage 3b: Secondary | ICD-10-CM | POA: Diagnosis not present

## 2022-10-03 DIAGNOSIS — M797 Fibromyalgia: Secondary | ICD-10-CM | POA: Diagnosis not present

## 2022-10-03 MED ORDER — FENOFIBRATE 48 MG PO TABS: 48.0000 mg | ORAL_TABLET | Freq: Every day | ORAL | 1 refills | Status: DC

## 2022-10-03 NOTE — Telephone Encounter (Signed)
Kim with [facility] calling for a verbal on patient.  1 week of 4 continuing, 1 week of 4 for OT.  Verbal given and form to follow via fax per Maudie Mercury. Routing to provider as an FYI to be on look out for this.

## 2022-10-04 ENCOUNTER — Other Ambulatory Visit: Payer: PPO

## 2022-10-04 ENCOUNTER — Telehealth: Payer: Self-pay | Admitting: *Deleted

## 2022-10-04 NOTE — Telephone Encounter (Signed)
Left message on the patient's answering machine for her to return call.

## 2022-10-04 NOTE — Telephone Encounter (Signed)
Patient called the office and left message stating she has a stage 4 wound and inflammation. Patient has continued to take her [place]. Please advise.

## 2022-10-05 NOTE — Telephone Encounter (Signed)
I called patient today and advised her to stop leflunomide and hydroxychloroquine as she has a wound.  She has been going to wound care.  Once the wound starts healing she can resume hydroxychloroquine.  She will need clearance from her wound care doctor before restarting leflunomide.

## 2022-10-06 ENCOUNTER — Encounter (HOSPITAL_BASED_OUTPATIENT_CLINIC_OR_DEPARTMENT_OTHER): Payer: PPO | Admitting: General Surgery

## 2022-10-06 DIAGNOSIS — L89314 Pressure ulcer of right buttock, stage 4: Secondary | ICD-10-CM | POA: Diagnosis not present

## 2022-10-06 DIAGNOSIS — L89610 Pressure ulcer of right heel, unstageable: Secondary | ICD-10-CM | POA: Diagnosis not present

## 2022-10-07 NOTE — Progress Notes (Signed)
Marisa, Cooper ([id number]) [id number] Page 1 of 9 Visit Report for 10/06/2022 Arrival Information Details Patient Name: Date of Service: [name] 10/06/2022 10:15 A M Medical Record Number: [id number] Patient Account Number: [id number] Date of Birth/Sex: Treating Cooper: 1951/01/07 (72 y.o. F) Primary Care [name]: [name] Other Clinician: Referring [name]: Treating [name] in Cooper: 4 Visit Information History Since Last Visit All ordered tests and consults were completed: No Patient Arrived: Wheel Chair Added or deleted any medications: No Arrival Time: 10:22 Any new allergies or adverse reactions: No Accompanied By: daughter Had a fall or experienced change in No Transfer Assistance: None activities of daily living that may affect Patient Identification Verified: Yes risk of falls: Secondary Verification Process Completed: Yes Signs or symptoms of abuse/neglect since last visito No Patient Requires Transmission-Based Precautions: No Hospitalized since last visit: No Patient Has Alerts: No Implantable device outside of the clinic excluding No cellular tissue based products placed in the center since last visit: Pain Present Now: No Electronic Signature(s) Signed: 10/06/2022 10:52:22 AM By: Worthy Cooper Entered By: Worthy Cooper on 10/06/2022 10:23:02 -------------------------------------------------------------------------------- Encounter Discharge Information Details Patient Name: Date of Service: Marisa MILEVA, Cooper 10/06/2022 10:15 A M Medical Record Number: [id number] Patient Account Number: [id number] Date of Birth/Sex: Treating Cooper: September 23, 1950 (72 y.o. America Brown Primary Care [name]: [name] Other Clinician: Referring [name]: Treating [name] in Cooper: 4 Encounter Discharge Information Items Post  Procedure Vitals Discharge Condition: Stable Temperature (F): 97.9 Ambulatory Status: Wheelchair Pulse (bpm): 61 Discharge Destination: Home Respiratory Rate (breaths/min): 20 Transportation: Private Auto Blood Pressure (mmHg): 123/76 Accompanied By: daughter Schedule Follow-up Appointment: Yes Clinical Summary of Care: Patient Declined Electronic Signature(s) Signed: 10/06/2022 12:21:52 PM By: [name] Entered By: [name] on 10/06/2022 12:15:11 [name] ([id number]) [id number] Page 2 of 9 -------------------------------------------------------------------------------- Lower Extremity Assessment Details Patient Name: Date of Service: Marisa, Cooper 10/06/2022 10:15 A M Medical Record Number: [id number] Patient Account Number: [id number] Date of Birth/Sex: Treating Cooper: 06/26/1951 (72 y.o. America Brown Primary Care Dezirea Mccollister: [name] Other Clinician: Referring [name]: Treating [name]/Extender: [name]: 4 Electronic Signature(s) Signed: 10/06/2022 12:21:52 PM By: [name] Entered By: [name] on 10/06/2022 11:04:53 -------------------------------------------------------------------------------- Multi Wound Chart Details Patient Name: Date of Service: [name] 10/06/2022 10:15 A M Medical Record Number: [id number] Patient Account Number: [id number] Date of Birth/Sex: Treating Cooper: 01-Jun-1951 (72 y.o. F) Primary Care [name]: [name] Other Clinician: Referring [name]: Treating [name]: [name]: 4 Vital Signs Height(in): 62 Pulse(bpm): 61 Weight(lbs): 207 Blood Pressure(mmHg): 123/76 Body Mass Index(BMI): 37.9 Temperature(F): 97.9 Respiratory Rate(breaths/min): 20 [1:Photos:] [N/A:N/A] Right Gluteus Right Calcaneus N/A Wound Location: Pressure Injury Pressure Injury  N/A Wounding Event: Pressure Ulcer Pressure Ulcer N/A Primary Etiology: Chronic sinus problems/congestion, Chronic sinus problems/congestion, N/A Comorbid History: Sleep Apnea, Congestive Heart Sleep Apnea, Congestive Heart Failure, Raynauds, Rheumatoid Failure, Raynauds, Rheumatoid Arthritis, Neuropathy Arthritis, Neuropathy 08/11/2022 09/21/2022 N/A Date Acquired: 4 1 N/A Weeks of Cooper: Open Open N/A Wound Status: No No N/A Wound Recurrence: 2x5.5x4.5 2.9x3.9x0.1 N/A Measurements L x W x D (cm) 8.639 8.883 N/A A (cm) : rea 38.877 0.888 N/A Volume (cm) : 60.70% 8.00% N/A % Reduction in A rea: 70.50% 8.10% N/A % Reduction in Volume: Category/Stage IV Unstageable/Unclassified N/A Classification: Large Medium N/A Exudate A mount: Serosanguineous Serosanguineous N/A Exudate Type: red, brown red, brown  N/A Exudate Color: Distinct, outline attached Flat and Intact N/A Wound Margin: Large (67-100%) Small (1-33%) N/A Granulation A mount: [name] ([id number]) [id number] Page 3 of 9 Red Red N/A Granulation Quality: Small (1-33%) Large (67-100%) N/A Necrotic Amount: Adherent Slough Eschar, Adherent Slough N/A Necrotic Tissue: Fat Layer (Subcutaneous Tissue): Yes Fat Layer (Subcutaneous Tissue): Yes N/A Exposed Structures: Fascia: No Fascia: No Tendon: No Tendon: No Muscle: No Muscle: No Joint: No Joint: No Bone: No Bone: No None Small (1-33%) N/A Epithelialization: Debridement - Selective/Open Wound Debridement - Excisional N/A Debridement: Pre-procedure Verification/Time Out 10:59 10:59 N/A Taken: Lidocaine 4% Topical Solution Lidocaine 4% Topical Solution N/A Pain Control: Other, Slough Subcutaneous, Slough N/A Tissue Debrided: Non-Viable Tissue Skin/Subcutaneous Tissue N/A Level: 11 11.31 N/A Debridement A (sq cm): rea Curette Curette N/A Instrument: Minimum Minimum N/A Bleeding: Pressure Pressure  N/A Hemostasis A chieved: 0 0 N/A Procedural Pain: 0 0 N/A Post Procedural Pain: Procedure was tolerated well Procedure was tolerated well N/A Debridement Cooper Response: 2x5.5x4.5 2.9x3.9x0.1 N/A Post Debridement Measurements L x W x D (cm) 38.877 0.888 N/A Post Debridement Volume: (cm) Category/Stage IV Unstageable/Unclassified N/A Post Debridement Stage: Excoriation: No No Abnormalities Noted N/A Periwound Skin Texture: Induration: No Callus: No Crepitus: No Rash: No Scarring: No Maceration: No Maceration: Yes N/A Periwound Skin Moisture: Dry/Scaly: No Dry/Scaly: No Atrophie Blanche: No No Abnormalities Noted N/A Periwound Skin Color: Cyanosis: No Ecchymosis: No Erythema: No Hemosiderin Staining: No Mottled: No Pallor: No Rubor: No No Abnormality No Abnormality N/A Temperature: Yes Yes N/A Tenderness on Palpation: Debridement Debridement N/A Procedures Performed: Cooper Notes Electronic Signature(s) Signed: 10/06/2022 11:25:51 AM By: Fredirick Maudlin MD FACS Entered By: Fredirick Maudlin on 10/06/2022 11:25:51 -------------------------------------------------------------------------------- Multi-Disciplinary Care Plan Details Patient Name: Date of Service: [name]. 10/06/2022 10:15 A M Medical Record Number: [id number] Patient Account Number: [id number] Date of Birth/Sex: Treating Cooper: 04-18-1951 (72 y.o. America Brown Primary Care Tennyson Wacha: [name] Other Clinician: Referring Karlyn Glasco: Treating Justyce Yeater/Extender: [name]: 4 Multidisciplinary Care Plan reviewed with physician Active Inactive Medication Nursing Diagnoses: Knowledge deficit related to medication safety: actual or potential Marisa, Cooper ([id number]) [phone].pdf Page 4 of 9 Goals: Patient/caregiver will demonstrate understanding of all current medications Date Initiated: 09/15/2022 T arget  Resolution Date: 01/12/2023 Goal Status: Active Interventions: Assess patient/caregiver ability to manage medication regimen upon admission and as needed Patient/Caregiver given reconciled medication list upon admission, changes in medications and discharge from the [place]: New medication prescribed at [place] : 09/15/2022 Notes: Pressure Nursing Diagnoses: Knowledge deficit related to management of pressures ulcers Goals: Patient will remain free of pressure ulcers Date Initiated: 09/15/2022 Date Inactivated: 09/28/2022 Target Resolution Date: 11/09/2022 Unmet Reason: new pressure ulcer Goal Status: Unmet right heel Patient/caregiver will verbalize risk factors for pressure ulcer development Date Initiated: 09/15/2022 Target Resolution Date: 01/12/2023 Goal Status: Active Interventions: Assess potential for pressure ulcer upon admission and as needed Cooper Activities: Patient referred for pressure reduction/relief devices : 09/15/2022 Pressure reduction/relief device ordered : 09/15/2022 Notes: Wound/Skin Impairment Nursing Diagnoses: Impaired tissue integrity Knowledge deficit related to ulceration/compromised skin integrity Goals: Patient/caregiver will verbalize understanding of skin care regimen Date Initiated: 09/28/2022 Target Resolution Date: 01/12/2023 Goal Status: Active Ulcer/skin breakdown will have a volume reduction of 50% by week 8 Date Initiated: 09/28/2022 Target Resolution Date: 01/12/2023 Goal Status: Active Interventions: Assess patient/caregiver ability to obtain necessary supplies Assess patient/caregiver ability to perform ulcer/skin care regimen upon admission and as needed  Assess ulceration(s) every visit Provide education on ulcer and skin care Cooper Activities: Skin care regimen initiated : 09/28/2022 Topical wound management initiated : 09/28/2022 Notes: Electronic Signature(s) Signed: 10/06/2022 12:21:52 PM By:  [name] Entered By: [name] on 10/06/2022 12:13:56 Pain Assessment Details -------------------------------------------------------------------------------- [name] ([id number]) [id number] Page 5 of 9 Patient Name: Date of Service: Marisa, Cooper 10/06/2022 10:15 A M Medical Record Number: [id number] Patient Account Number: [id number] Date of Birth/Sex: Treating Cooper: 12-31-1950 (72 y.o. F) Primary Care Kayle Passarelli: [name] Other Clinician: Referring Gurveer Colucci: Treating Decie Verne/Extender: [name]: 4 Active Problems Location of Pain Severity and Description of Pain Patient Has Paino No Site Locations Pain Management and Medication Current Pain Management: Electronic Signature(s) Signed: 10/06/2022 10:52:22 AM By: Worthy Cooper Entered By: Worthy Cooper on 10/06/2022 10:23:33 -------------------------------------------------------------------------------- Patient/Caregiver Education Details Patient Name: Date of Service: [place], [place] 2/23/2024andnbsp10:15 [place] Record Number: [id number] Patient Account Number: [id number] Date of Birth/Gender: Treating Cooper: 1951-02-06 (72 y.o. America Brown Primary Care Physician: [name] Other Clinician: Referring Physician: Treating Physician/Extender: [name]: 4 Education Assessment Education Provided To: Patient Education Topics Provided Wound/Skin Impairment: Methods: Explain/Verbal Responses: Return demonstration correctly Electronic Signature(s) Signed: 10/06/2022 12:21:52 PM By: [name] Entered By: [name] on 10/06/2022 12:14:12 [name] ([id number]) [id number] Page 6 of 9 -------------------------------------------------------------------------------- Wound Assessment Details Patient Name: Date of Service: Marisa, Cooper  10/06/2022 10:15 A M Medical Record Number: [id number] Patient Account Number: [id number] Date of Birth/Sex: Treating Cooper: 1951/05/30 (72 y.o. F) Primary Care Mahika Vanvoorhis: [name] Other Clinician: Referring Hamzah Savoca: Treating Idrissa Beville/Extender: [name]: 4 Wound Status Wound Number: 1 Primary Pressure Ulcer Etiology: Wound Location: Right Gluteus Wound Open Wounding Event: Pressure Injury Status: Date Acquired: 08/11/2022 Comorbid Chronic sinus problems/congestion, Sleep Apnea, Congestive Heart Weeks Of Cooper: 4 History: Failure, Raynauds, Rheumatoid Arthritis, Neuropathy Clustered Wound: No Photos Wound Measurements Length: (cm) 2 Width: (cm) 5.5 Depth: (cm) 4.5 Area: (cm) 8.639 Volume: (cm) 38.877 % Reduction in Area: 60.7% % Reduction in Volume: 70.5% Epithelialization: None Wound Description Classification: Category/Stage IV Wound Margin: Distinct, outline attached Exudate Amount: Large Exudate Type: Serosanguineous Exudate Color: red, brown Foul Odor After Cleansing: No Slough/Fibrino Yes Wound Bed Granulation Amount: Large (67-100%) Exposed Structure Granulation Quality: Red Fascia Exposed: No Necrotic Amount: Small (1-33%) Fat Layer (Subcutaneous Tissue) Exposed: Yes Necrotic Quality: Adherent Slough Tendon Exposed: No Muscle Exposed: No Joint Exposed: No Bone Exposed: No Periwound Skin Texture Texture Color No Abnormalities Noted: Yes No Abnormalities Noted: Yes Moisture Temperature / Pain No Abnormalities Noted: Yes Temperature: No Abnormality Tenderness on Palpation: Yes Cooper Notes Wound #1 (Gluteus) Wound Laterality: Right Cleanser Wound Cleanser Marisa, LOOTENS Cooper ([id number]) [phone].pdf Page 7 of 9 Discharge Instruction: Cleanse the wound with wound cleanser prior to applying a clean dressing using gauze sponges, not tissue or cotton balls. Peri-Wound  Care Topical Keystone antibiotic compound Discharge Instruction: squirt and spray into base of wound before packing Primary Dressing Dakin's Solution 0.25%, 16 (oz) Discharge Instruction: Moisten gauze with Dakin's solution and pack into wound Secondary Dressing Zetuvit Plus Silicone Border Sacrum Dressing, Sm, 7x7 (in/in) Discharge Instruction: Apply silicone border over primary dressing as directed. Secured With Compression Wrap Compression Stockings Environmental education officer) Signed: 10/06/2022 12:21:52 PM By: [name] Previous Signature: 10/06/2022 10:52:22 AM Version By: Worthy Cooper Entered By: [name] on 10/06/2022 11:05:39 -------------------------------------------------------------------------------- Wound Assessment  Details Patient Name: Date of Service: Marisa, Cooper 10/06/2022 10:15 A M Medical Record Number: [id number] Patient Account Number: [id number] Date of Birth/Sex: Treating Cooper: 06-03-1951 (72 y.o. F) Primary Care Samit Sylve: [name] Other Clinician: Referring Jammie Clink: Treating Jaclyn Carew/Extender: [name]: 4 Wound Status Wound Number: 3 Primary Pressure Ulcer Etiology: Wound Location: Right Calcaneus Wound Open Wounding Event: Pressure Injury Status: Date Acquired: 09/21/2022 Comorbid Chronic sinus problems/congestion, Sleep Apnea, Congestive Heart Weeks Of Cooper: 1 History: Failure, Raynauds, Rheumatoid Arthritis, Neuropathy Clustered Wound: No Photos Wound Measurements Length: (cm) 2.9 Width: (cm) 3.9 Depth: (cm) 0.1 Area: (cm) 8.883 Volume: (cm) 0.888 % Reduction in Area: 8% % Reduction in Volume: 8.1% Epithelialization: Small (1-33%) Tunneling: No Undermining: No Wound Description Marisa, Cooper ([id number]) Classification: Unstageable/Unclassified Wound Margin: Flat and Intact Exudate Amount: Medium Exudate Type: Serosanguineous Exudate Color: red,  brown [id number] Page 8 of 9 Foul Odor After Cleansing: No Slough/Fibrino Yes Wound Bed Granulation Amount: Small (1-33%) Exposed Structure Granulation Quality: Red Fascia Exposed: No Necrotic Amount: Large (67-100%) Fat Layer (Subcutaneous Tissue) Exposed: Yes Necrotic Quality: Eschar, Adherent Slough Tendon Exposed: No Muscle Exposed: No Joint Exposed: No Bone Exposed: No Periwound Skin Texture Texture Color No Abnormalities Noted: Yes No Abnormalities Noted: Yes Moisture Temperature / Pain No Abnormalities Noted: No Temperature: No Abnormality Dry / Scaly: No Tenderness on Palpation: Yes Maceration: Yes Cooper Notes Wound #3 (Calcaneus) Wound Laterality: Right Cleanser Peri-Wound Care Topical Primary Dressing Sorbalgon AG Dressing, 4x4 (in/in) Discharge Instruction: or equivalent silver alginate. Apply to wound bed as instructed Secondary Dressing ALLEVYN Heel 4 1/2in x 5 1/2in / 10.5cm x 13.5cm Discharge Instruction: Apply over primary dressing as directed. Woven Gauze Sponge, Non-Sterile 4x4 in Discharge Instruction: Apply over primary dressing as directed. Secured With [employer], 4.5x3.1 (in/yd) Discharge Instruction: Secure with Kerlix as directed. Paper Tape, 2x10 (in/yd) Discharge Instruction: Secure dressing with tape as directed. Compression Wrap Compression Stockings Add-Ons Electronic Signature(s) Signed: 10/06/2022 12:21:52 PM By: [name] Previous Signature: 10/06/2022 10:52:22 AM Version By: Worthy Cooper Entered By: [name] on 10/06/2022 11:06:08 -------------------------------------------------------------------------------- Vitals Details Patient Name: Date of Service: [name]. 10/06/2022 10:15 A M Medical Record Number: [id number] Patient Account Number: [id number] Date of Birth/Sex: Treating Cooper: 1951/02/22 (72 y.o. F) Primary Care Fitz Matsuo: [name] Other  Clinician: Referring Addalyn Speedy: Treating Aiva Miskell/Extender: [name], [name] ([id number]) [id number] Page 9 of 9 Weeks in Cooper: 4 Vital Signs Time Taken: 10:23 Temperature (F): 97.9 Height (in): 62 Pulse (bpm): 61 Weight (lbs): 207 Respiratory Rate (breaths/min): 20 Body Mass Index (BMI): 37.9 Blood Pressure (mmHg): 123/76 Reference Range: 80 - 120 mg / dl Electronic Signature(s) Signed: 10/06/2022 10:52:22 AM By: Worthy Cooper Entered By: Worthy Cooper on 10/06/2022 10:23:26

## 2022-10-07 NOTE — Progress Notes (Signed)
[name], [name] ([id number]) [id number] Page 1 of 11 Visit Report for 10/06/2022 Chief Complaint Document Details Patient Name: Date of Service: Marisa, Cooper 10/06/2022 10:15 A M Medical Record Number: [id number] Patient Account Number: [id number] Date of Birth/Sex: Treating RN: [birth year] (72 y.o. F) Primary Care Provider: Leretha Pol Other Clinician: Referring Provider: Treating Provider/Extender: Olin Pia in Treatment: 4 Information Obtained from: Patient Chief Complaint Patient is at the clinic for treatment of open pressure ulcers Electronic Signature(s) Signed: 10/06/2022 11:25:59 AM By: Fredirick Maudlin MD FACS Entered By: Fredirick Maudlin on 10/06/2022 11:25:59 -------------------------------------------------------------------------------- Debridement Details Patient Name: Date of Service: [name] 10/06/2022 10:15 A M Medical Record Number: [id number] Patient Account Number: [id number] Date of Birth/Sex: Treating RN: [birth year] (72 y.o. F) Primary Care Provider: Leretha Pol Other Clinician: Referring Provider: Treating Provider/Extender: Olin Pia in Treatment: 4 Debridement Performed for Assessment: Wound #1 Right Gluteus Performed By: Physician Fredirick Maudlin, MD Debridement Type: Debridement Level of Consciousness (Pre-procedure): Awake and Alert Pre-procedure Verification/Time Out Yes - 10:59 Taken: Start Time: 10:59 Pain Control: Lidocaine 4% T opical Solution T Area Debrided (L x W): otal 2 (cm) x 5.5 (cm) = 11 (cm) Tissue and other material debrided: Non-Viable, Muscle, Slough, Fibrin/Exudate, Slough Level: Skin/Subcutaneous Tissue/Muscle Debridement Description: Excisional Instrument: Curette Bleeding: Minimum Hemostasis Achieved: Pressure End Time: 11:00 Procedural Pain: 0 Post Procedural Pain: 0 Response to Treatment: Procedure was tolerated  well Level of Consciousness (Post- Awake and Alert procedure): Post Debridement Measurements of Total Wound Length: (cm) 2 Stage: Category/Stage IV Width: (cm) 5.5 Depth: (cm) 4.5 Volume: (cm) 38.877 Character of Wound/Ulcer Post Debridement: Improved Post Procedure Diagnosis Same as Pre-procedure Notes Scribed for Dr. Celine Ahr by J.[place] Electronic Signature(s) Signed: 10/06/2022 11:28:56 AM By: Fredirick Maudlin MD FACS Entered By: Fredirick Maudlin on 10/06/2022 11:28:55 [name] ([id number]) [id number] Page 2 of 11 -------------------------------------------------------------------------------- Debridement Details Patient Name: Date of Service: [name], [name] 10/06/2022 10:15 A M Medical Record Number: [id number] Patient Account Number: [id number] Date of Birth/Sex: Treating RN: 09/01/1950 (72 y.o. F) Primary Care Provider: Leretha Pol Other Clinician: Referring Provider: Treating Provider/Extender: Olin Pia in Treatment: 4 Debridement Performed for Assessment: Wound #3 Right Calcaneus Performed By: Physician Fredirick Maudlin, MD Debridement Type: Debridement Level of Consciousness (Pre-procedure): Awake and Alert Pre-procedure Verification/Time Out Yes - 10:59 Taken: Start Time: 10:59 Pain Control: Lidocaine 4% T opical Solution T Area Debrided (L x W): otal 2.9 (cm) x 3.9 (cm) = 11.31 (cm) Tissue and other material debrided: Non-Viable, Slough, Subcutaneous, Slough Level: Skin/Subcutaneous Tissue Debridement Description: Excisional Instrument: Curette Bleeding: Minimum Hemostasis Achieved: Pressure End Time: 11:00 Procedural Pain: 0 Post Procedural Pain: 0 Response to Treatment: Procedure was tolerated well Level of Consciousness (Post- Awake and Alert procedure): Post Debridement Measurements of Total Wound Length: (cm) 2.9 Stage: Unstageable/Unclassified Width: (cm) 3.9 Depth: (cm)  0.1 Volume: (cm) 0.888 Character of Wound/Ulcer Post Debridement: Improved Post Procedure Diagnosis Same as Pre-procedure Notes Scribed for Dr. Celine Ahr by J.Scotton Electronic Signature(s) Signed: 10/06/2022 11:29:09 AM By: Fredirick Maudlin MD FACS Entered By: Fredirick Maudlin on 10/06/2022 11:29:09 -------------------------------------------------------------------------------- HPI Details Patient Name: Date of Service: [name]. 10/06/2022 10:15 A M Medical Record Number: [id number] Patient Account Number: [id number] Date of Birth/Sex: Treating RN: Apr 30, 1951 (72 y.o. F) Primary Care Provider: Leretha Pol Other Clinician: Referring Provider: Treating Provider/Extender: Olin Pia in Treatment: 4 History of Present Illness HPI Description: ADMISSION 09/07/2023 This is  a 72 year old woman with a past medical history notable for obesity, congestive heart failure, Raynaud's syndrome, CKD stage IIIb, osteoporosis, and rheumatoid arthritis. In November 2023, she suffered a fall that resulted in a femur fracture. She was hospitalized for about a week and underwent surgical repair of the fracture. She subsequently developed pressure ulcers on her right buttocks and ischium. She has been receiving home health services and they have been applying Medihoney. They have been reporting the wounds as stage II, but on evaluation, the large ulcer was probably unstageable at the time they were evaluating it but it is clearly a stage IV at this point. The smaller ulcer does have fat layer exposure and therefore is a stage III. The patient is accompanied by her daughter. She says she has been sleeping on a regular bed but recently ordered an air mattress T opper, but does not have it yet. She is on Ozempic for weight loss and therefore has a poor appetite and struggles to get adequate protein intake. Marisa, Cooper ([id number]) [id number]  Page 3 of 11 09/15/2022: The large stage IV ulcer is substantially cleaner this week. The stage III ulcer is a little smaller. Both have slough accumulation. The culture that I took last week was polymicrobial. The Augmentin that I prescribed was adequate coverage for the species and she continues to take this. We have also ordered Keystone topical antibiotic compound, but this has not yet arrived. 09/21/2022: The stage IV ulcer has some necrotic muscle at the base but is otherwise fairly clean. The stage III ulcer is smaller again with light slough on the surface. She has her Keystone topical antibiotic compound with her today. 09/29/2022: There is still some necrotic muscle at the base of the stage IV ulcer that I was unable to get to last week. The stage III ulcer has healed. She unfortunately has developed a new pressure ulcer on her right heel. 10/06/2022: Still with some devitalized muscle at the base of the stage IV ulcer, but otherwise this wound is looking quite clean. The pressure induced tissue injury on her heel is demarcating and much of the area appears to be epithelialized, but there is still 2 areas that remain questionable. Electronic Signature(s) Signed: 10/06/2022 11:27:10 AM By: Fredirick Maudlin MD FACS Entered By: Fredirick Maudlin on 10/06/2022 11:27:10 -------------------------------------------------------------------------------- Physical Exam Details Patient Name: Date of Service: [name], [name] 10/06/2022 10:15 A M Medical Record Number: [id number] Patient Account Number: [id number] Date of Birth/Sex: Treating RN: 25-Oct-1950 (72 y.o. F) Primary Care Provider: Leretha Pol Other Clinician: Referring Provider: Treating Provider/Extender: Olin Pia in Treatment: 4 Constitutional . . . . no acute distress. Respiratory Normal work of breathing on room air. Notes 10/06/2022: Still with some devitalized muscle at the base of the stage IV ulcer,  but otherwise this wound is looking quite clean. The pressure induced tissue injury on her heel is demarcating and much of the area appears to be epithelialized, but there is still 2 areas that remain questionable. Electronic Signature(s) Signed: 10/06/2022 11:27:41 AM By: Fredirick Maudlin MD FACS Entered By: Fredirick Maudlin on 10/06/2022 11:27:40 -------------------------------------------------------------------------------- Physician Orders Details Patient Name: Date of Service: [name] 10/06/2022 10:15 A M Medical Record Number: [id number] Patient Account Number: [id number] Date of Birth/Sex: Treating RN: 1950-09-16 (72 y.o. [place] Brown Primary Care Provider: Leretha Pol Other Clinician: Referring Provider: Treating Provider/Extender: Olin Pia in Treatment: 4 Verbal / Phone Orders: No Diagnosis Coding ICD-10 Coding  Code Description L89.314 Pressure ulcer of right buttock, stage 4 L89.610 Pressure ulcer of right heel, unstageable I73.00 Raynaud's syndrome without gangrene 99991111 Chronic diastolic (congestive) heart failure N18.32 Chronic kidney disease, stage 3b R73.02 Impaired glucose tolerance (oral) E66.9 Obesity, unspecified Follow-up Appointments ppointment in 1 week. - Dr. Celine Ahr Room 1 Return A Thursday 2/29 @ 1:15 pm RM 1 Anesthetic TONEISHA, STRANGER E ([id number]) [id number] Page 4 of 11 Wound #1 Right Gluteus (In clinic) Topical Lidocaine 5% applied to wound bed (In clinic) Topical Lidocaine 4% applied to wound bed Wound #3 Right Calcaneus (In clinic) Topical Lidocaine 5% applied to wound bed (In clinic) Topical Lidocaine 4% applied to wound bed Bathing/ Shower/ Hygiene May shower and wash wound with soap and water. Negative Presssure Wound Therapy Wound #1 Right Gluteus Wound Vac to wound continuously at 116m/hg pressure - RUN IVR for wound vac for Right Gluteus Black  Foam Off-Loading Low air-loss mattress (Group 2) - ordered from [facility] needs hospital bed Turn and reposition every 2 hours - avoid lying on back, stand at least every hour while out of bed, float heels off bed with pillows under calves while in bed Prevalon Boot - righ tfoot Additional Orders / Instructions Follow Nutritious Diet - increase protein to 70-80 gms per day, hold ozempic Home Health New wound care orders this week; continue Home Health for wound care. May utilize formulary equivalent dressing for wound treatment orders unless otherwise specified. - see orders Other Home Health Orders/Instructions: - [facility] Wound Treatment Wound #1 - Gluteus Wound Laterality: Right Cleanser: Wound Cleanser 1 x Per Day/30 Days Discharge Instructions: Cleanse the wound with wound cleanser prior to applying a clean dressing using gauze sponges, not tissue or cotton balls. Topical: Keystone antibiotic compound 1 x Per Day/30 Days Discharge Instructions: squirt and spray into base of wound before packing Prim Dressing: Dakin's Solution 0.25%, 16 (oz) 1 x Per Day/30 Days ary Discharge Instructions: Moisten gauze with Dakin's solution and pack into wound Secondary Dressing: Zetuvit Plus Silicone Border Sacrum Dressing, Sm, 7x7 (in/in) 1 x Per Day/30 Days Discharge Instructions: Apply silicone border over primary dressing as directed. Wound #3 - Calcaneus Wound Laterality: Right Prim Dressing: Sorbalgon [employer] Dressing, 4x4 (in/in) 3 x Per Week/30 Days ary Discharge Instructions: or equivalent silver alginate. Apply to wound bed as instructed Secondary Dressing: ALLEVYN Heel 4 1/2in x 5 1/2in / 10.5cm x 13.5cm 3 x Per Week/30 Days Discharge Instructions: Apply over primary dressing as directed. Secondary Dressing: Woven Gauze Sponge, Non-Sterile 4x4 in 3 x Per Week/30 Days Discharge Instructions: Apply over primary dressing as directed. Secured With: [employer] 4.5x3.1 (in/yd) 3 x Per  Week/30 Days Discharge Instructions: Secure with Kerlix as directed. Secured With: Paper Tape, 2x10 (in/yd) 3 x Per Week/30 Days Discharge Instructions: Secure dressing with tape as directed. Electronic Signature(s) Signed: 10/06/2022 12:21:52 PM By: SDellie CatholicRN Signed: 10/06/2022 12:30:01 PM By: CFredirick MaudlinMD FACS Entered By: SDellie Catholicon 10/06/2022 12:16:04 -------------------------------------------------------------------------------- Problem List Details Patient Name: Date of Service: CRojelio Brenner2/23/2024 10:15 A M Medical Record Number: [id number] Account Number: [id number] of Birth/Sex: Treating RN: 1February 18, 1952(72y.o. F) Primary Care Provider: BLeretha PolOther Clinician: CZYERA, LICHTMAN([id number] Page 5 of 11 Referring Provider: Treating Provider/Extender: COlin Piain Treatment: 4 Active Problems ICD-10 Encounter Code Description Active Date MDM Diagnosis L89.314 Pressure ulcer of right buttock, stage 4 09/07/2022 No Yes L89.610 Pressure ulcer of right heel, unstageable  09/28/2022 No Yes I73.00 Raynaud's syndrome without gangrene 09/07/2022 No Yes [id number] Chronic diastolic (congestive) heart failure 09/07/2022 No Yes N18.32 Chronic kidney disease, stage 3b 09/07/2022 No Yes R73.02 Impaired glucose tolerance (oral) 09/07/2022 No Yes E66.9 Obesity, unspecified 09/07/2022 No Yes Inactive Problems ICD-10 Code Description Active Date Inactive Date L89.313 Pressure ulcer of right buttock, stage 3 09/07/2022 09/07/2022 Resolved Problems Electronic Signature(s) Signed: 10/06/2022 11:22:34 AM By: Fredirick Maudlin MD FACS Entered By: Fredirick Maudlin on 10/06/2022 11:22:33 -------------------------------------------------------------------------------- Progress Note Details Patient Name: Date of Service: [name] 10/06/2022 10:15 A M Medical Record Number:  [id number] Patient Account Number: [id number] Date of Birth/Sex: Treating RN: [birth year] (72 y.o. F) Primary Care Provider: Leretha Pol Other Clinician: Referring Provider: Treating Provider/Extender: Olin Pia in Treatment: 4 Subjective Chief Complaint Information obtained from Patient Patient is at the clinic for treatment of open pressure ulcers History of Present Illness (HPI) ADMISSION 09/07/2023 This is a 72 year old woman with a past medical history notable for obesity, congestive heart failure, Raynaud's syndrome, CKD stage IIIb, osteoporosis, and rheumatoid arthritis. In November 2023, she suffered a fall that resulted in a femur fracture. She was hospitalized for about a week and underwent surgical JULLIETTE, HAGEMAN ([id number]) [id number] Page 6 of 11 repair of the fracture. She subsequently developed pressure ulcers on her right buttocks and ischium. She has been receiving home health services and they have been applying Medihoney. They have been reporting the wounds as stage II, but on evaluation, the large ulcer was probably unstageable at the time they were evaluating it but it is clearly a stage IV at this point. The smaller ulcer does have fat layer exposure and therefore is a stage III. The patient is accompanied by her daughter. She says she has been sleeping on a regular bed but recently ordered an air mattress T opper, but does not have it yet. She is on Ozempic for weight loss and therefore has a poor appetite and struggles to get adequate protein intake. 09/15/2022: The large stage IV ulcer is substantially cleaner this week. The stage III ulcer is a little smaller. Both have slough accumulation. The culture that I took last week was polymicrobial. The Augmentin that I prescribed was adequate coverage for the species and she continues to take this. We have also ordered Keystone topical antibiotic compound, but this  has not yet arrived. 09/21/2022: The stage IV ulcer has some necrotic muscle at the base but is otherwise fairly clean. The stage III ulcer is smaller again with light slough on the surface. She has her Keystone topical antibiotic compound with her today. 09/29/2022: There is still some necrotic muscle at the base of the stage IV ulcer that I was unable to get to last week. The stage III ulcer has healed. She unfortunately has developed a new pressure ulcer on her right heel. 10/06/2022: Still with some devitalized muscle at the base of the stage IV ulcer, but otherwise this wound is looking quite clean. The pressure induced tissue injury on her heel is demarcating and much of the area appears to be epithelialized, but there is still 2 areas that remain questionable. Patient History Information obtained from Patient, Caregiver, Chart. Family History Cancer - Father,Paternal Grandparents, Heart Disease - Mother,Father,Paternal Grandparents, Hypertension - Mother, Stroke - Maternal Grandparents, No family history of Diabetes, Hereditary Spherocytosis, Kidney Disease, Lung Disease, Seizures, Thyroid Problems, Tuberculosis. Social History Never smoker, Marital Status - Widowed, Alcohol Use - Rarely, Drug Use - Prior History -  TCH, Caffeine Use - Daily - soda, tea. Medical History Eyes Denies history of Cataracts, Glaucoma, Optic Neuritis Ear/Nose/Mouth/Throat Patient has history of Chronic sinus problems/congestion - chronic rhinitis Denies history of Middle ear problems Respiratory Patient has history of Sleep Apnea - uses CPAP Cardiovascular Patient has history of Congestive Heart Failure Endocrine Denies history of Type I Diabetes, Type II Diabetes Genitourinary Denies history of End Stage Renal Disease Immunological Patient has history of Raynaudoos Denies history of Lupus Erythematosus, Scleroderma Integumentary (Skin) Denies history of History of Burn Musculoskeletal Patient has  history of Rheumatoid Arthritis Denies history of Gout, Osteoarthritis, Osteomyelitis Neurologic Patient has history of Neuropathy Denies history of Dementia, Quadriplegia, Paraplegia, Seizure Disorder Oncologic Denies history of Received Chemotherapy, Received Radiation Psychiatric Denies history of Anorexia/bulimia, Confinement Anxiety Hospitalization/Surgery History - right femur fx ORIR. - right knee replacement. - left breast mass excision. - left ankle tibia repair. - abdominal hysterectomy. - adnoidectomy/tonsillectomy. - cholecystectomy. - dental implants. - patoid cystectomy. - tubal ligation. Medical A Surgical History Notes nd Constitutional Symptoms (General Health) morbid obesity Cardiovascular hyperlipidemia Gastrointestinal GERD, IBS, eosinophilic esophagitis, diverticulosis, Genitourinary CKD stage 3 Immunological Sjoegren syndrome, fibromyalgia Objective Constitutional no acute distress. Vitals Time Taken: 10:23 AM, Height: 62 in, Weight: 207 lbs, BMI: 37.9, Temperature: 97.9 F, Pulse: 61 bpm, Respiratory Rate: 20 breaths/min, Blood Pressure: 123/76 mmHg. NARAYANI, THUMANN ([id number]) [id number] Page 7 of 11 Respiratory Normal work of breathing on room air. General Notes: 10/06/2022: Still with some devitalized muscle at the base of the stage IV ulcer, but otherwise this wound is looking quite clean. The pressure induced tissue injury on her heel is demarcating and much of the area appears to be epithelialized, but there is still 2 areas that remain questionable. Integumentary (Hair, Skin) Wound #1 status is Open. Original cause of wound was Pressure Injury. The date acquired was: 08/11/2022. The wound has been in treatment 4 weeks. The wound is located on the Right Gluteus. The wound measures 2cm length x 5.5cm width x 4.5cm depth; 8.639cm^2 area and 38.877cm^3 volume. There is Fat Layer (Subcutaneous Tissue) exposed. There is a large  amount of serosanguineous drainage noted. The wound margin is distinct with the outline attached to the wound base. There is large (67-100%) red granulation within the wound bed. There is a small (1-33%) amount of necrotic tissue within the wound bed including Adherent Slough. The periwound skin appearance had no abnormalities noted for texture. The periwound skin appearance had no abnormalities noted for moisture. The periwound skin appearance had no abnormalities noted for color. Periwound temperature was noted as No Abnormality. The periwound has tenderness on palpation. Wound #3 status is Open. Original cause of wound was Pressure Injury. The date acquired was: 09/21/2022. The wound has been in treatment 1 weeks. The wound is located on the Right Calcaneus. The wound measures 2.9cm length x 3.9cm width x 0.1cm depth; 8.883cm^2 area and 0.888cm^3 volume. There is Fat Layer (Subcutaneous Tissue) exposed. There is no tunneling or undermining noted. There is a medium amount of serosanguineous drainage noted. The wound margin is flat and intact. There is small (1-33%) red granulation within the wound bed. There is a large (67-100%) amount of necrotic tissue within the wound bed including Eschar and Adherent Slough. The periwound skin appearance had no abnormalities noted for texture. The periwound skin appearance had no abnormalities noted for color. The periwound skin appearance exhibited: Maceration. The periwound skin appearance did not exhibit: Dry/Scaly. Periwound temperature was noted as No Abnormality. The  periwound has tenderness on palpation. Assessment Active Problems ICD-10 Pressure ulcer of right buttock, stage 4 Pressure ulcer of right heel, unstageable Raynaud's syndrome without gangrene Chronic diastolic (congestive) heart failure Chronic kidney disease, stage 3b Impaired glucose tolerance (oral) Obesity, unspecified Procedures Wound #1 Pre-procedure diagnosis of Wound #1 is a  Pressure Ulcer located on the Right Gluteus . There was a Excisional Skin/Subcutaneous Tissue/Muscle Debridement with a total area of 11 sq cm performed by Fredirick Maudlin, MD. With the following instrument(s): Curette to remove Non-Viable tissue/material. Material removed includes Muscle, Slough, Fibrin/Exudate, and Other: necrotic muscle after achieving pain control using Lidocaine 4% T opical Solution. No specimens were taken. A time out was conducted at 10:59, prior to the start of the procedure. A Minimum amount of bleeding was controlled with Pressure. The procedure was tolerated well with a pain level of 0 throughout and a pain level of 0 following the procedure. Post Debridement Measurements: 2cm length x 5.5cm width x 4.5cm depth; 38.877cm^3 volume. Post debridement Stage noted as Category/Stage IV. Character of Wound/Ulcer Post Debridement is improved. Post procedure Diagnosis Wound #1: Same as Pre-Procedure General Notes: Scribed for Dr. Celine Ahr by J.[place]. Wound #3 Pre-procedure diagnosis of Wound #3 is a Pressure Ulcer located on the Right Calcaneus . There was a Excisional Skin/Subcutaneous Tissue Debridement with a total area of 11.31 sq cm performed by Fredirick Maudlin, MD. With the following instrument(s): Curette to remove Non-Viable tissue/material. Material removed includes Subcutaneous Tissue, Slough, and Other: necrotic muscle after achieving pain control using Lidocaine 4% T opical Solution. No specimens were taken. A time out was conducted at 10:59, prior to the start of the procedure. A Minimum amount of bleeding was controlled with Pressure. The procedure was tolerated well with a pain level of 0 throughout and a pain level of 0 following the procedure. Post Debridement Measurements: 2.9cm length x 3.9cm width x 0.1cm depth; 0.888cm^3 volume. Post debridement Stage noted as Unstageable/Unclassified. Character of Wound/Ulcer Post Debridement is improved. Post procedure  Diagnosis Wound #3: Same as Pre-Procedure General Notes: Scribed for Dr. Celine Ahr by J.Scotton. Plan Follow-up Appointments: Return Appointment in 1 week. - Dr. Celine Ahr Room 1 Thursday 2/29 @ 1:15 pm RM 1 Anesthetic: Wound #1 Right Gluteus: (In clinic) Topical Lidocaine 5% applied to wound bed (In clinic) Topical Lidocaine 4% applied to wound bed Wound #3 Right Calcaneus: (In clinic) Topical Lidocaine 5% applied to wound bed (In clinic) Topical Lidocaine 4% applied to wound bed Bathing/ Shower/ Hygiene: May shower and wash wound with soap and water. EMMALENE, PLATANIA ([id number]) [id number] Page 8 of 11 Negative Presssure Wound Therapy: Wound #1 Right Gluteus: Wound Vac to wound continuously at 113m/hg pressure - RUN IVR for wound vac for Right Gluteus Black Foam Off-Loading: Low air-loss mattress (Group 2) - ordered from [facility] needs hospital bed Turn and reposition every 2 hours - avoid lying on back, stand at least every hour while out of bed, float heels off bed with pillows under calves while in bed Prevalon Boot - righ tfoot Additional Orders / Instructions: Follow Nutritious Diet - increase protein to 70-80 gms per day, hold ozempic Home Health: New wound care orders this week; continue Home Health for wound care. May utilize formulary equivalent dressing for wound treatment orders unless otherwise specified. - see orders Other Home Health Orders/Instructions: - [facility] WOUND #1: - Gluteus Wound Laterality: Right Cleanser: Wound Cleanser 1 x Per Day/30 Days Discharge Instructions: Cleanse the wound with wound cleanser prior to applying a clean  dressing using gauze sponges, not tissue or cotton balls. Topical: Keystone antibiotic compound 1 x Per Day/30 Days Discharge Instructions: squirt and spray into base of wound before packing Prim Dressing: Dakin's Solution 0.25%, 16 (oz) 1 x Per Day/30 Days ary Discharge Instructions: Moisten gauze with  Dakin's solution and pack into wound Secondary Dressing: Zetuvit Plus Silicone Border Sacrum Dressing, Sm, 7x7 (in/in) 1 x Per Day/30 Days Discharge Instructions: Apply silicone border over primary dressing as directed. WOUND #3: - Calcaneus Wound Laterality: Right Prim Dressing: Sorbalgon AG Dressing, 4x4 (in/in) 3 x Per Week/30 Days ary Discharge Instructions: or equivalent silver alginate. Apply to wound bed as instructed Secondary Dressing: ALLEVYN Heel 4 1/2in x 5 1/2in / 10.5cm x 13.5cm 3 x Per Week/30 Days Discharge Instructions: Apply over primary dressing as directed. Secondary Dressing: Woven Gauze Sponge, Non-Sterile 4x4 in 3 x Per Week/30 Days Discharge Instructions: Apply over primary dressing as directed. Secured With: [employer], 4.5x3.1 (in/yd) 3 x Per Week/30 Days Discharge Instructions: Secure with Kerlix as directed. Secured With: Paper T ape, 2x10 (in/yd) 3 x Per Week/30 Days Discharge Instructions: Secure dressing with tape as directed. 10/06/2022: Still with some devitalized muscle at the base of the stage IV ulcer, but otherwise this wound is looking quite clean. The pressure induced tissue injury on her heel is demarcating and much of the area appears to be epithelialized, but there is still 2 areas that remain questionable. I used a curette to debride the necrotic muscle from the base of the stage IV ulcer. I think at this point I have cleaned it all out, as I am unable to visualize any further areas of dead tissue. We will continue Keystone topical antibiotic compound and Dakin's moistened gauze packing on this site. We are going to run her insurance for a wound VAC. On the heel, I debrided slough and nonviable subcutaneous tissue. Continue silver alginate and Prevalon boot/heel floating. She did receive her low-air-loss mattress and this seems to be helping, as well. Follow-up in 1 week. Electronic Signature(s) Signed: 10/09/2022 4:58:37 PM By: Deon Pilling  RN, BSN Signed: 10/09/2022 5:09:54 PM By: Fredirick Maudlin MD FACS Previous Signature: 10/06/2022 11:30:42 AM Version By: Fredirick Maudlin MD FACS Entered By: Deon Pilling on 10/09/2022 14:19:02 -------------------------------------------------------------------------------- HxROS Details Patient Name: Date of Service: Marisa, Cooper 10/06/2022 10:15 A M Medical Record Number: [id number] Patient Account Number: [id number] Date of Birth/Sex: Treating RN: 06-26-1951 (72 y.o. F) Primary Care Provider: Leretha Pol Other Clinician: Referring Provider: Treating Provider/Extender: Olin Pia in Treatment: 4 Information Obtained From Patient Caregiver Chart Constitutional Symptoms (General Health) Medical History: Past Medical History Notes: morbid obesity Eyes Medical History: Negative for: Cataracts; Glaucoma; Optic Neuritis Ear/Nose/Mouth/Throat Medical History: Positive for: Chronic sinus problems/congestion - chronic rhinitis LANET, VANDEHEY ([id number]) [id number] Page 9 of 11 Negative for: Middle ear problems Respiratory Medical History: Positive for: Sleep Apnea - uses CPAP Cardiovascular Medical History: Positive for: Congestive Heart Failure Past Medical History Notes: hyperlipidemia Gastrointestinal Medical History: Past Medical History Notes: GERD, IBS, eosinophilic esophagitis, diverticulosis, Endocrine Medical History: Negative for: Type I Diabetes; Type II Diabetes Genitourinary Medical History: Negative for: End Stage Renal Disease Past Medical History Notes: CKD stage 3 Immunological Medical History: Positive for: Raynauds Negative for: Lupus Erythematosus; Scleroderma Past Medical History Notes: Sjoegren syndrome, fibromyalgia Integumentary (Skin) Medical History: Negative for: History of Burn Musculoskeletal Medical History: Positive for: Rheumatoid Arthritis Negative for: Gout;  Osteoarthritis; Osteomyelitis Neurologic Medical History: Positive for: Neuropathy  Negative for: Dementia; Quadriplegia; Paraplegia; Seizure Disorder Oncologic Medical History: Negative for: Received Chemotherapy; Received Radiation Psychiatric Medical History: Negative for: Anorexia/bulimia; Confinement Anxiety HBO Extended History Items Ear/Nose/Mouth/Throat: Chronic sinus problems/congestion Immunizations Pneumococcal Vaccine: Received Pneumococcal Vaccination: Yes Received Pneumococcal Vaccination On or After 60th Birthday: Yes Implantable [address] [name], [name] ([id number]) [id number] Page 10 of 11 None Hospitalization / Surgery History Type of Hospitalization/Surgery right femur fx ORIR right knee replacement left breast mass excision left ankle tibia repair abdominal hysterectomy adnoidectomy/tonsillectomy cholecystectomy dental implants patoid cystectomy tubal ligation Family and Social History Cancer: Yes - Father,Paternal Grandparents; Diabetes: No; Heart Disease: Yes - Mother,Father,Paternal Grandparents; Hereditary Spherocytosis: No; Hypertension: Yes - Mother; Kidney Disease: No; Lung Disease: No; Seizures: No; Stroke: Yes - Maternal Grandparents; Thyroid Problems: No; Tuberculosis: No; Never smoker; Marital Status - Widowed; Alcohol Use: Rarely; Drug Use: Prior History - TCH; Caffeine Use: Daily - soda, tea; Financial Concerns: No; Food, Clothing or Shelter Needs: No; Support System Lacking: No; Transportation Concerns: No Electronic Signature(s) Signed: 10/06/2022 12:30:01 PM By: Fredirick Maudlin MD FACS Entered By: Fredirick Maudlin on 10/06/2022 11:27:15 -------------------------------------------------------------------------------- SuperBill Details Patient Name: Date of Service: [name] 10/06/2022 Medical Record Number: [id number] Patient Account Number: [id number] Date of Birth/Sex: Treating RN: [birth year] (72 y.o.  F) Primary Care Provider: Leretha Pol Other Clinician: Referring Provider: Treating Provider/Extender: Olin Pia in Treatment: 4 Diagnosis Coding ICD-10 Codes Code Description L89.314 Pressure ulcer of right buttock, stage 4 L89.610 Pressure ulcer of right heel, unstageable I73.00 Raynaud's syndrome without gangrene [id number] Chronic diastolic (congestive) heart failure N18.32 Chronic kidney disease, stage 3b R73.02 Impaired glucose tolerance (oral) E66.9 Obesity, unspecified Facility Procedures : CPT4 Code: [id number] Description: [id number] - DEB SUBQ TISSUE 20 SQ CM/< ICD-10 Diagnosis Description L89.610 Pressure ulcer of right heel, unstageable Modifier: Quantity: 1 : CPT4 Code: [id number] Description: [id number] - DEB MUSC/FASCIA 20 SQ CM/< ICD-10 Diagnosis Description L89.314 Pressure ulcer of right buttock, stage 4 Modifier: Quantity: 1 Physician Procedures : CPT4 Code Description Modifier [id number] - WC PHYS LEVEL 4 - EST PT 25 ICD-10 Diagnosis Description L89.314 Pressure ulcer of right buttock, stage 4 L89.610 Pressure ulcer of right heel, unstageable [id number] Chronic diastolic (congestive) heart  failure N18.32 Chronic kidney disease, stage 3b Quantity: 1 : [id number] - WC PHYS SUBQ TISS 20 SQ CM RAMESHA, FRISQUE E ([id number]) [id number] ICD-10 Diagnosis Description L89.610 Pressure ulcer of right heel, unstageable Quantity: 1 f Page 11 of 11 : [id number] - WC PHYS DEBR MUSCLE/FASCIA 20 SQ CM 1 ICD-10 Diagnosis Description L89.314 Pressure ulcer of right buttock, stage 4 Quantity: Electronic Signature(s) Signed: 10/06/2022 11:31:13 AM By: Fredirick Maudlin MD FACS Entered By: Fredirick Maudlin on 10/06/2022 11:31:13

## 2022-10-10 DIAGNOSIS — K219 Gastro-esophageal reflux disease without esophagitis: Secondary | ICD-10-CM | POA: Diagnosis not present

## 2022-10-10 DIAGNOSIS — I5032 Chronic diastolic (congestive) heart failure: Secondary | ICD-10-CM | POA: Diagnosis not present

## 2022-10-10 DIAGNOSIS — G8929 Other chronic pain: Secondary | ICD-10-CM | POA: Diagnosis not present

## 2022-10-10 DIAGNOSIS — J329 Chronic sinusitis, unspecified: Secondary | ICD-10-CM | POA: Diagnosis not present

## 2022-10-10 DIAGNOSIS — G4733 Obstructive sleep apnea (adult) (pediatric): Secondary | ICD-10-CM | POA: Diagnosis not present

## 2022-10-10 DIAGNOSIS — N1832 Chronic kidney disease, stage 3b: Secondary | ICD-10-CM | POA: Diagnosis not present

## 2022-10-10 DIAGNOSIS — L89612 Pressure ulcer of right heel, stage 2: Secondary | ICD-10-CM | POA: Diagnosis not present

## 2022-10-10 DIAGNOSIS — M80051D Age-related osteoporosis with current pathological fracture, right femur, subsequent encounter for fracture with routine healing: Secondary | ICD-10-CM | POA: Diagnosis not present

## 2022-10-10 DIAGNOSIS — M3500 Sicca syndrome, unspecified: Secondary | ICD-10-CM | POA: Diagnosis not present

## 2022-10-10 DIAGNOSIS — M797 Fibromyalgia: Secondary | ICD-10-CM | POA: Diagnosis not present

## 2022-10-10 DIAGNOSIS — Z9181 History of falling: Secondary | ICD-10-CM | POA: Diagnosis not present

## 2022-10-10 DIAGNOSIS — M069 Rheumatoid arthritis, unspecified: Secondary | ICD-10-CM | POA: Diagnosis not present

## 2022-10-10 DIAGNOSIS — Z7982 Long term (current) use of aspirin: Secondary | ICD-10-CM | POA: Diagnosis not present

## 2022-10-10 DIAGNOSIS — M9701XD Periprosthetic fracture around internal prosthetic right hip joint, subsequent encounter: Secondary | ICD-10-CM | POA: Diagnosis not present

## 2022-10-10 DIAGNOSIS — I130 Hypertensive heart and chronic kidney disease with heart failure and stage 1 through stage 4 chronic kidney disease, or unspecified chronic kidney disease: Secondary | ICD-10-CM | POA: Diagnosis not present

## 2022-10-10 DIAGNOSIS — M545 Low back pain, unspecified: Secondary | ICD-10-CM | POA: Diagnosis not present

## 2022-10-10 DIAGNOSIS — Z7985 Long-term (current) use of injectable non-insulin antidiabetic drugs: Secondary | ICD-10-CM | POA: Diagnosis not present

## 2022-10-10 DIAGNOSIS — L89310 Pressure ulcer of right buttock, unstageable: Secondary | ICD-10-CM | POA: Diagnosis not present

## 2022-10-10 DIAGNOSIS — E1122 Type 2 diabetes mellitus with diabetic chronic kidney disease: Secondary | ICD-10-CM | POA: Diagnosis not present

## 2022-10-10 NOTE — Progress Notes (Signed)
TeleHealth Visit:  Due to the COVID-19 pandemic, this visit was completed with telemedicine (audio/video) technology to reduce patient and provider exposure as well as to preserve personal protective equipment.   [name] has verbally consented to this TeleHealth visit. The patient is located at home, the provider is located at the [facility] office. The participants in this visit include the listed provider and patient. The visit was conducted today via MyChart Video.  Chief Complaint: OBESITY [name] is here to discuss her progress with her obesity treatment plan along with follow-up of her obesity related diagnoses. [name] is on the Category 2 Plan and states she is following her eating plan approximately 0% of the time. [name] states she is walking when able.   Today's visit was #: 33 Starting weight: 239 lbs Starting date: 12/01/2019  Interim History: [name] fell in the hallway of her house and broke her femur in November.  Went to rehab and then came to house mid December.  Still getting therapy at home.  Has stage IV ulcer on her backside.  She is off Ozempic and was not eating much.  Subjective:   1. Other hyperlipidemia On Fenofibrate.  No side effects.  Sees cardiology.  2. Sacral decubitus ulcer, stage IV ([name]) [name] is seeing wound care weekly and getting daily dressing changes.  Pain particularly when is undergoing changes.  Assessment/Plan:   1. Other hyperlipidemia Will refill Fenofibrate 48 mg by mouth daily for 3 months with 0 refills.  -Refill fenofibrate (TRICOR) 48 MG tablet; Take 1 tablet (48 mg total) by mouth daily.  Dispense: 90 tablet; Refill: 1  2. Sacral decubitus ulcer, stage IV ([name]) Will follow-up with wound care weekly.  3. BMI 37.0-37.9, adult  4. Obesity with starting BMI of 45.1 [name] is currently in the action stage of change. As such, her goal is to continue with weight loss efforts. She has agreed to the Category 2 Plan.   Exercise  goals: Older adults should follow the adult guidelines. When older adults cannot meet the adult guidelines, they should be as physically active as their abilities and conditions will allow.   Behavioral modification strategies: increasing lean protein intake, meal planning and cooking strategies, keeping healthy foods in the home, and planning for success.  [name] has agreed to follow-up with our clinic in 8 weeks. She was informed of the importance of frequent follow-up visits to maximize her success with intensive lifestyle modifications for her multiple health conditions.  Objective:   VITALS: Per patient if applicable, see vitals. GENERAL: Alert and in no acute distress. CARDIOPULMONARY: No increased WOB. Speaking in clear sentences.  PSYCH: Pleasant and cooperative. Speech normal rate and rhythm. Affect is appropriate. Insight and judgement are appropriate. Attention is focused, linear, and appropriate.  NEURO: Oriented as arrived to appointment on time with no prompting.   Lab Results  Component Value Date   CREATININE 1.28 (H) 06/25/2022   BUN 23 06/25/2022   NA 136 06/25/2022   K 4.2 06/25/2022   CL 103 06/25/2022   CO2 26 06/25/2022   Lab Results  Component Value Date   ALT 15 06/23/2022   AST 26 06/23/2022   ALKPHOS 45 06/23/2022   BILITOT 0.7 06/23/2022   Lab Results  Component Value Date   HGBA1C 5.3 03/27/2022   HGBA1C 5.4 10/11/2021   HGBA1C 5.2 01/31/2021   HGBA1C 5.5 09/23/2020   HGBA1C 5.8 (H) 05/11/2020   Lab Results  Component Value Date  INSULIN 14.2 01/31/2021   INSULIN 25.4 (H) 09/23/2020   INSULIN 21.0 06/01/2020   INSULIN 32.3 (H) 12/01/2019   Lab Results  Component Value Date   TSH 0.883 11/18/2021   Lab Results  Component Value Date   CHOL 157 03/27/2022   HDL 68 03/27/2022   LDLCALC 64 03/27/2022   TRIG 145 03/27/2022   CHOLHDL 2.3 03/27/2022   Lab Results  Component Value Date   VD25OH 41.92 06/23/2022   VD25OH 92.2 09/23/2020    VD25OH 62.9 12/01/2019   Lab Results  Component Value Date   WBC 5.9 06/25/2022   HGB 9.5 (L) 06/25/2022   HCT 29.3 (L) 06/25/2022   MCV 89.3 06/25/2022   PLT 176 06/25/2022   No results found for: "IRON", "TIBC", "FERRITIN"  Attestation Statements:   Reviewed by clinician on day of visit: allergies, medications, problem list, medical history, surgical history, family history, social history, and previous encounter notes.  I, Elnora Morrison, RMA am acting as transcriptionist for Coralie Common, MD.  I have reviewed the above documentation for accuracy and completeness, and I agree with the above. - Coralie Common, MD

## 2022-10-12 ENCOUNTER — Encounter (HOSPITAL_BASED_OUTPATIENT_CLINIC_OR_DEPARTMENT_OTHER): Payer: PPO | Admitting: General Surgery

## 2022-10-12 DIAGNOSIS — L89610 Pressure ulcer of right heel, unstageable: Secondary | ICD-10-CM | POA: Diagnosis not present

## 2022-10-12 DIAGNOSIS — I5032 Chronic diastolic (congestive) heart failure: Secondary | ICD-10-CM | POA: Diagnosis not present

## 2022-10-12 DIAGNOSIS — L89314 Pressure ulcer of right buttock, stage 4: Secondary | ICD-10-CM | POA: Diagnosis not present

## 2022-10-12 DIAGNOSIS — S72401D Unspecified fracture of lower end of right femur, subsequent encounter for closed fracture with routine healing: Secondary | ICD-10-CM | POA: Diagnosis not present

## 2022-10-12 DIAGNOSIS — L89313 Pressure ulcer of right buttock, stage 3: Secondary | ICD-10-CM | POA: Diagnosis not present

## 2022-10-12 DIAGNOSIS — R2689 Other abnormalities of gait and mobility: Secondary | ICD-10-CM | POA: Diagnosis not present

## 2022-10-12 DIAGNOSIS — I5022 Chronic systolic (congestive) heart failure: Secondary | ICD-10-CM | POA: Diagnosis not present

## 2022-10-12 DIAGNOSIS — R7302 Impaired glucose tolerance (oral): Secondary | ICD-10-CM | POA: Diagnosis not present

## 2022-10-12 DIAGNOSIS — N1832 Chronic kidney disease, stage 3b: Secondary | ICD-10-CM | POA: Diagnosis not present

## 2022-10-12 DIAGNOSIS — M6281 Muscle weakness (generalized): Secondary | ICD-10-CM | POA: Diagnosis not present

## 2022-10-13 DIAGNOSIS — M80051D Age-related osteoporosis with current pathological fracture, right femur, subsequent encounter for fracture with routine healing: Secondary | ICD-10-CM | POA: Diagnosis not present

## 2022-10-13 DIAGNOSIS — Z7985 Long-term (current) use of injectable non-insulin antidiabetic drugs: Secondary | ICD-10-CM | POA: Diagnosis not present

## 2022-10-13 DIAGNOSIS — E1122 Type 2 diabetes mellitus with diabetic chronic kidney disease: Secondary | ICD-10-CM | POA: Diagnosis not present

## 2022-10-13 DIAGNOSIS — J329 Chronic sinusitis, unspecified: Secondary | ICD-10-CM | POA: Diagnosis not present

## 2022-10-13 DIAGNOSIS — M545 Low back pain, unspecified: Secondary | ICD-10-CM | POA: Diagnosis not present

## 2022-10-13 DIAGNOSIS — M069 Rheumatoid arthritis, unspecified: Secondary | ICD-10-CM | POA: Diagnosis not present

## 2022-10-13 DIAGNOSIS — Z9181 History of falling: Secondary | ICD-10-CM | POA: Diagnosis not present

## 2022-10-13 DIAGNOSIS — L89314 Pressure ulcer of right buttock, stage 4: Secondary | ICD-10-CM | POA: Diagnosis not present

## 2022-10-13 DIAGNOSIS — G4733 Obstructive sleep apnea (adult) (pediatric): Secondary | ICD-10-CM | POA: Diagnosis not present

## 2022-10-13 DIAGNOSIS — M3500 Sicca syndrome, unspecified: Secondary | ICD-10-CM | POA: Diagnosis not present

## 2022-10-13 DIAGNOSIS — K219 Gastro-esophageal reflux disease without esophagitis: Secondary | ICD-10-CM | POA: Diagnosis not present

## 2022-10-13 DIAGNOSIS — M797 Fibromyalgia: Secondary | ICD-10-CM | POA: Diagnosis not present

## 2022-10-13 DIAGNOSIS — L89612 Pressure ulcer of right heel, stage 2: Secondary | ICD-10-CM | POA: Diagnosis not present

## 2022-10-13 DIAGNOSIS — L89310 Pressure ulcer of right buttock, unstageable: Secondary | ICD-10-CM | POA: Diagnosis not present

## 2022-10-13 DIAGNOSIS — I5032 Chronic diastolic (congestive) heart failure: Secondary | ICD-10-CM | POA: Diagnosis not present

## 2022-10-13 DIAGNOSIS — I130 Hypertensive heart and chronic kidney disease with heart failure and stage 1 through stage 4 chronic kidney disease, or unspecified chronic kidney disease: Secondary | ICD-10-CM | POA: Diagnosis not present

## 2022-10-13 DIAGNOSIS — N1832 Chronic kidney disease, stage 3b: Secondary | ICD-10-CM | POA: Diagnosis not present

## 2022-10-13 DIAGNOSIS — Z7982 Long term (current) use of aspirin: Secondary | ICD-10-CM | POA: Diagnosis not present

## 2022-10-13 DIAGNOSIS — G8929 Other chronic pain: Secondary | ICD-10-CM | POA: Diagnosis not present

## 2022-10-13 DIAGNOSIS — M9701XD Periprosthetic fracture around internal prosthetic right hip joint, subsequent encounter: Secondary | ICD-10-CM | POA: Diagnosis not present

## 2022-10-14 NOTE — Progress Notes (Signed)
Marisa, Cooper ([id number]) [id number] Page 1 of 9 Visit Report for 10/12/2022 Chief Complaint Document Details Patient Name: Date of Service: [name], [name] 10/12/2022 1:15 PM Medical Record Number: [id number] Patient Account Number: [id number] Date of Birth/Sex: Treating RN: [birth year] (72 y.o. F) Primary Care Provider: Leretha Pol Other Clinician: Referring Provider: Treating Provider/Extender: Olin Pia in Treatment: 5 Information Obtained from: Patient Chief Complaint Patient is at the clinic for treatment of open pressure ulcers Electronic Signature(s) Signed: 10/12/2022 2:21:35 PM By: Fredirick Maudlin MD FACS Entered By: Fredirick Maudlin on 10/12/2022 14:21:35 -------------------------------------------------------------------------------- HPI Details Patient Name: Date of Service: [name] 10/12/2022 1:15 PM Medical Record Number: [id number] Patient Account Number: [id number] Date of Birth/Sex: Treating RN: 27-Oct-1950 (72 y.o. F) Primary Care Provider: Leretha Pol Other Clinician: Referring Provider: Treating Provider/Extender: Olin Pia in Treatment: 5 History of Present Illness HPI Description: ADMISSION 09/07/2023 This is a 72 year old woman with a past medical history notable for obesity, congestive heart failure, Raynaud's syndrome, CKD stage IIIb, osteoporosis, and rheumatoid arthritis. In November 2023, she suffered a fall that resulted in a femur fracture. She was hospitalized for about a week and underwent surgical repair of the fracture. She subsequently developed pressure ulcers on her right buttocks and ischium. She has been receiving home health services and they have been applying Medihoney. They have been reporting the wounds as stage II, but on evaluation, the large ulcer was probably unstageable at the time they were evaluating it but it is clearly a stage IV  at this point. The smaller ulcer does have fat layer exposure and therefore is a stage III. The patient is accompanied by her daughter. She says she has been sleeping on a regular bed but recently ordered an air mattress T opper, but does not have it yet. She is on Ozempic for weight loss and therefore has a poor appetite and struggles to get adequate protein intake. 09/15/2022: The large stage IV ulcer is substantially cleaner this week. The stage III ulcer is a little smaller. Both have slough accumulation. The culture that I took last week was polymicrobial. The Augmentin that I prescribed was adequate coverage for the species and she continues to take this. We have also ordered Keystone topical antibiotic compound, but this has not yet arrived. 09/21/2022: The stage IV ulcer has some necrotic muscle at the base but is otherwise fairly clean. The stage III ulcer is smaller again with light slough on the surface. She has her Keystone topical antibiotic compound with her today. 09/29/2022: There is still some necrotic muscle at the base of the stage IV ulcer that I was unable to get to last week. The stage III ulcer has healed. She unfortunately has developed a new pressure ulcer on her right heel. 10/06/2022: Still with some devitalized muscle at the base of the stage IV ulcer, but otherwise this wound is looking quite clean. The pressure induced tissue injury on her heel is demarcating and much of the area appears to be epithelialized, but there is still 2 areas that remain questionable. 10/12/2022: The stage IV ulcer is very clean and ready for wound VAC, which will be delivered tomorrow according to the patient. The tissue injury on her heel is drying up and does not feel particularly boggy this week. Electronic Signature(s) Signed: 10/12/2022 2:22:37 PM By: Fredirick Maudlin MD FACS Entered By: Fredirick Maudlin on 10/12/2022  14:22:37 -------------------------------------------------------------------------------- Physical Exam Details Patient Name: Date of Service: CA RTER,  [name]. 10/12/2022 1:15 PM Medical Record Number: [id number] Patient Account Number: [id number] Date of Birth/Sex: Treating RN: 12/16/1950 (72 y.o. [name], [name] ([id number]) [id number] Page 2 of 9 Primary Care Provider: Leretha Pol Other Clinician: Referring Provider: Treating Provider/Extender: Olin Pia in Treatment: 5 Constitutional . . . . no acute distress. Respiratory Normal work of breathing on room air. Notes 10/12/2022: The stage IV ulcer is very clean and ready for wound VAC, which will be delivered tomorrow according to the patient. The tissue injury on her heel is drying up and does not feel particularly boggy this week. Electronic Signature(s) Signed: 10/12/2022 2:31:57 PM By: Fredirick Maudlin MD FACS Entered By: Fredirick Maudlin on 10/12/2022 14:31:56 -------------------------------------------------------------------------------- Physician Orders Details Patient Name: Date of Service: [name], [name] 10/12/2022 1:15 PM Medical Record Number: [id number] Patient Account Number: [id number] Date of Birth/Sex: Treating RN: 1951-08-08 (72 y.o. [name] Primary Care Provider: Leretha Pol Other Clinician: Referring Provider: Treating Provider/Extender: Olin Pia in Treatment: 5 Verbal / Phone Orders: No Diagnosis Coding ICD-10 Coding Code Description L89.314 Pressure ulcer of right buttock, stage 4 L89.610 Pressure ulcer of right heel, unstageable I73.00 Raynaud's syndrome without gangrene 99991111 Chronic diastolic (congestive) heart failure N18.32 Chronic kidney disease, stage 3b R73.02 Impaired glucose tolerance (oral) E66.9 Obesity, unspecified Follow-up Appointments ppointment in 1 week. - Dr. Celine Ahr Room  1 Return A Thursday 3/7 @ 12:30 pm RM 1 Anesthetic Wound #1 Right Gluteus (In clinic) Topical Lidocaine 5% applied to wound bed (In clinic) Topical Lidocaine 4% applied to wound bed Wound #3 Right Calcaneus (In clinic) Topical Lidocaine 5% applied to wound bed (In clinic) Topical Lidocaine 4% applied to wound bed Bathing/ Shower/ Hygiene May shower and wash wound with soap and water. Negative Presssure Wound Therapy Wound #1 Right Gluteus Wound Vac to wound continuously at 142m/hg pressure - Home health to apply once available. and change three times a week. Black Foam Off-Loading Low air-loss mattress (Group 2) - ordered from [facility] needs hospital bed Turn and reposition every 2 hours - avoid lying on back, stand at least every hour while out of bed, float heels off bed with pillows under calves while in bed Prevalon Boot - right foot esp. while in bed. Additional Orders / Instructions Follow Nutritious Diet - increase protein to 70-80 gms per day, hold ozempic CJALIYHA, PERRIS([id number] Page 3 [place] 9[place] continue Home Health for wound care. May utilize formulary equivalent dressing for wound treatment orders unless otherwise specified. - Home health to d/c Dakin's and apply wound vac. Other Home Health Orders/Instructions: - [facility] Wound Treatment Wound #1 - Gluteus Wound Laterality: Right Cleanser: Wound Cleanser 1 x Per Day/30 Days Discharge Instructions: Cleanse the wound with wound cleanser prior to applying a clean dressing using gauze sponges, not tissue or cotton balls. Topical: Keystone antibiotic compound 1 x Per Day/30 Days Discharge Instructions: squirt and spray into base of wound before packing Prim Dressing: Dakin's Solution 0.25%, 16 (oz) 1 x Per Day/30 Days ary Discharge Instructions: Moisten gauze with Dakin's solution and pack into wound Secondary Dressing: Zetuvit Plus  Silicone Border Sacrum Dressing, Sm, 7x7 (in/in) 1 x Per Day/30 Days Discharge Instructions: Apply silicone border over primary dressing as directed. Wound #3 - Calcaneus Wound Laterality: Right Topical: Betadine Solution 1 x Per Day/30 Days Discharge Instructions: paint right heel with betadine Secondary Dressing: ALLEVYN Heel 4 1/2in x 5 1/2in /  10.5cm x 13.5cm 1 x Per Day/30 Days Discharge Instructions: Apply over primary dressing as directed. Secondary Dressing: Woven Gauze Sponge, Non-Sterile 4x4 in 1 x Per Day/30 Days Discharge Instructions: Apply over primary dressing as directed. Secured With: [employer], 4.5x3.1 (in/yd) 1 x Per Day/30 Days Discharge Instructions: Secure with Kerlix as directed. Secured With: Paper Tape, 2x10 (in/yd) 1 x Per Day/30 Days Discharge Instructions: Secure dressing with tape as directed. Electronic Signature(s) Signed: 10/12/2022 3:36:47 PM By: Fredirick Maudlin MD FACS Entered By: Fredirick Maudlin on 10/12/2022 14:34:12 -------------------------------------------------------------------------------- Problem List Details Patient Name: Date of Service: [name], [name] 10/12/2022 1:15 PM Medical Record Number: [id number] Patient Account Number: [id number] Date of Birth/Sex: Treating RN: 16-Nov-1950 (72 y.o. [name] Primary Care Provider: Leretha Pol Other Clinician: Referring Provider: Treating Provider/Extender: Olin Pia in Treatment: 5 Active Problems ICD-10 Encounter Code Description Active Date MDM Diagnosis L89.314 Pressure ulcer of right buttock, stage 4 09/07/2022 No Yes L89.610 Pressure ulcer of right heel, unstageable 09/28/2022 No Yes I73.00 Raynaud's syndrome without gangrene 09/07/2022 No Yes [id number] Chronic diastolic (congestive) heart failure 09/07/2022 No Yes PATINA, MCCARTHA ([id number]) [id number] Page 4 of 9 N18.32 Chronic kidney disease, stage 3b 09/07/2022  No Yes R73.02 Impaired glucose tolerance (oral) 09/07/2022 No Yes E66.9 Obesity, unspecified 09/07/2022 No Yes Inactive Problems ICD-10 Code Description Active Date Inactive Date L89.313 Pressure ulcer of right buttock, stage 3 09/07/2022 09/07/2022 Resolved Problems Electronic Signature(s) Signed: 10/12/2022 2:12:27 PM By: Fredirick Maudlin MD FACS Entered By: Fredirick Maudlin on 10/12/2022 14:12:27 -------------------------------------------------------------------------------- Progress Note Details Patient Name: Date of Service: [name] 10/12/2022 1:15 PM Medical Record Number: [id number] Patient Account Number: [id number] Date of Birth/Sex: Treating RN: [birth year] (72 y.o. F) Primary Care Provider: Leretha Pol Other Clinician: Referring Provider: Treating Provider/Extender: Olin Pia in Treatment: 5 Subjective Chief Complaint Information obtained from Patient Patient is at the clinic for treatment of open pressure ulcers History of Present Illness (HPI) ADMISSION 09/07/2023 This is a 72 year old woman with a past medical history notable for obesity, congestive heart failure, Raynaud's syndrome, CKD stage IIIb, osteoporosis, and rheumatoid arthritis. In November 2023, she suffered a fall that resulted in a femur fracture. She was hospitalized for about a week and underwent surgical repair of the fracture. She subsequently developed pressure ulcers on her right buttocks and ischium. She has been receiving home health services and they have been applying Medihoney. They have been reporting the wounds as stage II, but on evaluation, the large ulcer was probably unstageable at the time they were evaluating it but it is clearly a stage IV at this point. The smaller ulcer does have fat layer exposure and therefore is a stage III. The patient is accompanied by her daughter. She says she has been sleeping on a regular bed but recently ordered an air  mattress T opper, but does not have it yet. She is on Ozempic for weight loss and therefore has a poor appetite and struggles to get adequate protein intake. 09/15/2022: The large stage IV ulcer is substantially cleaner this week. The stage III ulcer is a little smaller. Both have slough accumulation. The culture that I took last week was polymicrobial. The Augmentin that I prescribed was adequate coverage for the species and she continues to take this. We have also ordered Keystone topical antibiotic compound, but this has not yet arrived. 09/21/2022: The stage IV ulcer has some necrotic muscle at the base but is otherwise fairly  clean. The stage III ulcer is smaller again with light slough on the surface. She has her Keystone topical antibiotic compound with her today. 09/29/2022: There is still some necrotic muscle at the base of the stage IV ulcer that I was unable to get to last week. The stage III ulcer has healed. She unfortunately has developed a new pressure ulcer on her right heel. 10/06/2022: Still with some devitalized muscle at the base of the stage IV ulcer, but otherwise this wound is looking quite clean. The pressure induced tissue injury on her heel is demarcating and much of the area appears to be epithelialized, but there is still 2 areas that remain questionable. 10/12/2022: The stage IV ulcer is very clean and ready for wound VAC, which will be delivered tomorrow according to the patient. The tissue injury on her heel is drying up and does not feel particularly boggy this week. Patient History Information obtained from Patient, Caregiver, Chart. Family History Cancer - Father,Paternal Grandparents, Heart Disease - Mother,Father,Paternal Grandparents, Hypertension - Mother, Stroke - Maternal Grandparents, No family history of Diabetes, Hereditary Spherocytosis, Kidney Disease, Lung Disease, Seizures, Thyroid Problems, Tuberculosis. RIGHTEOUS, [name] ([id number])  [id number] Page 5 of 9 Social History Never smoker, Marital Status - Widowed, Alcohol Use - Rarely, Drug Use - Prior History - TCH, Caffeine Use - Daily - soda, tea. Medical History Eyes Denies history of Cataracts, Glaucoma, Optic Neuritis Ear/Nose/Mouth/Throat Patient has history of Chronic sinus problems/congestion - chronic rhinitis Denies history of Middle ear problems Respiratory Patient has history of Sleep Apnea - uses CPAP Cardiovascular Patient has history of Congestive Heart Failure Endocrine Denies history of Type I Diabetes, Type II Diabetes Genitourinary Denies history of End Stage Renal Disease Immunological Patient has history of Raynaudoos Denies history of Lupus Erythematosus, Scleroderma Integumentary (Skin) Denies history of History of Burn Musculoskeletal Patient has history of Rheumatoid Arthritis Denies history of Gout, Osteoarthritis, Osteomyelitis Neurologic Patient has history of Neuropathy Denies history of Dementia, Quadriplegia, Paraplegia, Seizure Disorder Oncologic Denies history of Received Chemotherapy, Received Radiation Psychiatric Denies history of Anorexia/bulimia, Confinement Anxiety Hospitalization/Surgery History - right femur fx ORIR. - right knee replacement. - left breast mass excision. - left ankle tibia repair. - abdominal hysterectomy. - adnoidectomy/tonsillectomy. - cholecystectomy. - dental implants. - patoid cystectomy. - tubal ligation. Medical A Surgical History Notes nd Constitutional Symptoms (General Health) morbid obesity Cardiovascular hyperlipidemia Gastrointestinal GERD, IBS, eosinophilic esophagitis, diverticulosis, Genitourinary CKD stage 3 Immunological Sjoegren syndrome, fibromyalgia Objective Constitutional no acute distress. Vitals Time Taken: 1:15 AM, Height: 62 in, Weight: 207 lbs, BMI: 37.9, Temperature: 98.2 F, Pulse: 71 bpm, Respiratory Rate: 20 breaths/min, Blood  Pressure: 117/58 mmHg. Respiratory Normal work of breathing on room air. General Notes: 10/12/2022: The stage IV ulcer is very clean and ready for wound VAC, which will be delivered tomorrow according to the patient. The tissue injury on her heel is drying up and does not feel particularly boggy this week. Integumentary (Hair, Skin) Wound #1 status is Open. Original cause of wound was Pressure Injury. The date acquired was: 08/11/2022. The wound has been in treatment 5 weeks. The wound is located on the Right Gluteus. The wound measures 3cm length x 6.4cm width x 5.6cm depth; 15.08cm^2 area and 84.446cm^3 volume. There is Fat Layer (Subcutaneous Tissue) exposed. There is no tunneling noted, however, there is undermining starting at 10:00 and ending at 2:00 with a maximum distance of 10.8cm. There is a large amount of serosanguineous drainage noted. The wound margin is distinct with  the outline attached to the wound base. There is large (67-100%) red granulation within the wound bed. There is a small (1-33%) amount of necrotic tissue within the wound bed including Adherent Slough. The periwound skin appearance had no abnormalities noted for texture. The periwound skin appearance had no abnormalities noted for moisture. The periwound skin appearance had no abnormalities noted for color. Periwound temperature was noted as No Abnormality. The periwound has tenderness on palpation. Wound #3 status is Open. Original cause of wound was Pressure Injury. The date acquired was: 09/21/2022. The wound has been in treatment 2 weeks. The wound is located on the Right Calcaneus. The wound measures 2.6cm length x 3.1cm width x 0.1cm depth; 6.33cm^2 area and 0.633cm^3 volume. There is Fat Layer (Subcutaneous Tissue) exposed. There is no tunneling or undermining noted. There is a none present amount of drainage noted. The wound margin is flat and intact. There is no granulation within the wound bed. There is a large  (67-100%) amount of necrotic tissue within the wound bed including Eschar. The periwound skin appearance had no abnormalities noted for texture. The periwound skin appearance had no abnormalities noted for color. The periwound skin appearance did not exhibit: Dry/Scaly, Maceration. Periwound temperature was noted as No Abnormality. The periwound has tenderness on palpation. BERNELL, [name] ([id number]) [id number] Page 6 of 9 Assessment Active Problems ICD-10 Pressure ulcer of right buttock, stage 4 Pressure ulcer of right heel, unstageable Raynaud's syndrome without gangrene Chronic diastolic (congestive) heart failure Chronic kidney disease, stage 3b Impaired glucose tolerance (oral) Obesity, unspecified Plan Follow-up Appointments: Return Appointment in 1 week. - Dr. Celine Ahr Room 1 Thursday 3/7 @ 12:30 pm RM 1 Anesthetic: Wound #1 Right Gluteus: (In clinic) Topical Lidocaine 5% applied to wound bed (In clinic) Topical Lidocaine 4% applied to wound bed Wound #3 Right Calcaneus: (In clinic) Topical Lidocaine 5% applied to wound bed (In clinic) Topical Lidocaine 4% applied to wound bed Bathing/ Shower/ Hygiene: May shower and wash wound with soap and water. Negative Presssure Wound Therapy: Wound #1 Right Gluteus: Wound Vac to wound continuously at 123m/hg pressure - Home health to apply once available. and change three times a week. Black Foam Off-Loading: Low air-loss mattress (Group 2) - ordered from [facility] needs hospital bed Turn and reposition every 2 hours - avoid lying on back, stand at least every hour while out of bed, float heels off bed with pillows under calves while in bed Prevalon Boot - right foot esp. while in bed. Additional Orders / Instructions: Follow Nutritious Diet - increase protein to 70-80 gms per day, hold ozempic Home Health: New wound care orders this week; continue Home Health for wound care. May utilize formulary equivalent  dressing for wound treatment orders unless otherwise specified. - Home health to d/c Dakin's and apply wound vac. Other Home Health Orders/Instructions: - [facility] WOUND #1: - Gluteus Wound Laterality: Right Cleanser: Wound Cleanser 1 x Per Day/30 Days Discharge Instructions: Cleanse the wound with wound cleanser prior to applying a clean dressing using gauze sponges, not tissue or cotton balls. Topical: Keystone antibiotic compound 1 x Per Day/30 Days Discharge Instructions: squirt and spray into base of wound before packing Prim Dressing: Dakin's Solution 0.25%, 16 (oz) 1 x Per Day/30 Days ary Discharge Instructions: Moisten gauze with Dakin's solution and pack into wound Secondary Dressing: Zetuvit Plus Silicone Border Sacrum Dressing, Sm, 7x7 (in/in) 1 x Per Day/30 Days Discharge Instructions: Apply silicone border over primary dressing as directed. WOUND #3: - Calcaneus Wound  Laterality: Right Topical: Betadine Solution 1 x Per Day/30 Days Discharge Instructions: paint right heel with betadine Secondary Dressing: ALLEVYN Heel 4 1/2in x 5 1/2in / 10.5cm x 13.5cm 1 x Per Day/30 Days Discharge Instructions: Apply over primary dressing as directed. Secondary Dressing: Woven Gauze Sponge, Non-Sterile 4x4 in 1 x Per Day/30 Days Discharge Instructions: Apply over primary dressing as directed. Secured With: [employer], 4.5x3.1 (in/yd) 1 x Per Day/30 Days Discharge Instructions: Secure with Kerlix as directed. Secured With: Paper T ape, 2x10 (in/yd) 1 x Per Day/30 Days Discharge Instructions: Secure dressing with tape as directed. 10/12/2022: The stage IV ulcer is very clean and ready for wound VAC, which will be delivered tomorrow according to the patient. The tissue injury on her heel is drying up and does not feel particularly boggy this week. No debridement was necessary for either of her wounds this week. We will pack the large ulcer with Dakin's moistened gauze and her home  health providers will apply the wound VAC when it arrives. I am going to have them paint her heel with Betadine on a daily basis and I anticipate that the outer layers of devitalized tissue will dry up and leave an eschar that I can peel off easily. She will follow-up in 1 week. Electronic Signature(s) Signed: 10/12/2022 2:35:13 PM By: Fredirick Maudlin MD FACS Entered By: Fredirick Maudlin on 10/12/2022 14:35:12 [name] ([id number]) [id number] Page 7 of 9 -------------------------------------------------------------------------------- HxROS Details Patient Name: Date of Service: [name], SNAWDER 10/12/2022 1:15 PM Medical Record Number: [id number] Patient Account Number: [id number] Date of Birth/Sex: Treating RN: 10-11-1950 (72 y.o. F) Primary Care Provider: Leretha Pol Other Clinician: Referring Provider: Treating Provider/Extender: Olin Pia in Treatment: 5 Information Obtained From Patient Caregiver Chart Constitutional Symptoms (General Health) Medical History: Past Medical History Notes: morbid obesity Eyes Medical History: Negative for: Cataracts; Glaucoma; Optic Neuritis Ear/Nose/Mouth/Throat Medical History: Positive for: Chronic sinus problems/congestion - chronic rhinitis Negative for: Middle ear problems Respiratory Medical History: Positive for: Sleep Apnea - uses CPAP Cardiovascular Medical History: Positive for: Congestive Heart Failure Past Medical History Notes: hyperlipidemia Gastrointestinal Medical History: Past Medical History Notes: GERD, IBS, eosinophilic esophagitis, diverticulosis, Endocrine Medical History: Negative for: Type I Diabetes; Type II Diabetes Genitourinary Medical History: Negative for: End Stage Renal Disease Past Medical History Notes: CKD stage 3 Immunological Medical History: Positive for: Raynauds Negative for: Lupus Erythematosus; Scleroderma Past Medical  History Notes: Sjoegren syndrome, fibromyalgia Integumentary (Skin) Medical History: Negative for: History of Burn Musculoskeletal Medical History: Positive for: Rheumatoid Arthritis Negative for: Gout; Osteoarthritis; Osteomyelitis [name], [name] ([id number]) [id number] Page 8 of 9 Neurologic Medical History: Positive for: Neuropathy Negative for: Dementia; Quadriplegia; Paraplegia; Seizure Disorder Oncologic Medical History: Negative for: Received Chemotherapy; Received Radiation Psychiatric Medical History: Negative for: Anorexia/bulimia; Confinement Anxiety HBO Extended History Items Ear/Nose/Mouth/Throat: Chronic sinus problems/congestion Immunizations Pneumococcal Vaccine: Received Pneumococcal Vaccination: Yes Received Pneumococcal Vaccination On or After 60th Birthday: Yes Implantable Devices None Hospitalization / Surgery History Type of Hospitalization/Surgery right femur fx ORIR right knee replacement left breast mass excision left ankle tibia repair abdominal hysterectomy adnoidectomy/tonsillectomy cholecystectomy dental implants patoid cystectomy tubal ligation Family and Social History Cancer: Yes - Father,Paternal Grandparents; Diabetes: No; Heart Disease: Yes - Mother,Father,Paternal Grandparents; Hereditary Spherocytosis: No; Hypertension: Yes - Mother; Kidney Disease: No; Lung Disease: No; Seizures: No; Stroke: Yes - Maternal Grandparents; Thyroid Problems: No; Tuberculosis: No; Never smoker; Marital Status - Widowed; Alcohol Use: Rarely; Drug Use: Prior History - TCH; Caffeine  Use: Daily - soda, tea; Financial Concerns: No; Food, Clothing or Shelter Needs: No; Support System Lacking: No; Transportation Concerns: No Electronic Signature(s) Signed: 10/12/2022 3:36:47 PM By: Fredirick Maudlin MD FACS Entered By: Fredirick Maudlin on 10/12/2022  14:31:34 -------------------------------------------------------------------------------- SuperBill Details Patient Name: Date of Service: [name] 10/12/2022 Medical Record Number: [id number] Patient Account Number: [id number] Date of Birth/Sex: Treating RN: Jan 29, 1951 (72 y.o. [name] Primary Care Provider: Leretha Pol Other Clinician: Referring Provider: Treating Provider/Extender: Olin Pia in Treatment: 5 Diagnosis Coding ICD-10 Codes Code Description L89.314 Pressure ulcer of right buttock, stage 4 L89.610 Pressure ulcer of right heel, unstageable I73.00 Raynaud's syndrome without gangrene [id number] Chronic diastolic (congestive) heart failure N18.32 Chronic kidney disease, stage 3b [name], [name] ([id number]) [id number] Page 9 of 9 R73.02 Impaired glucose tolerance (oral) E66.9 Obesity, unspecified Facility Procedures : CPT4 Code: [id number] Description: [id number] - WOUND CARE VISIT-LEV 4 EST PT Modifier: Quantity: 1 Physician Procedures : CPT4 Code Description Modifier [id number] - WC PHYS LEVEL 4 - EST PT ICD-10 Diagnosis Description L89.314 Pressure ulcer of right buttock, stage 4 L89.610 Pressure ulcer of right heel, unstageable [id number] Chronic diastolic (congestive) heart failure  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

## 2022-10-15 DIAGNOSIS — F418 Other specified anxiety disorders: Secondary | ICD-10-CM | POA: Insufficient documentation

## 2022-10-16 DIAGNOSIS — M797 Fibromyalgia: Secondary | ICD-10-CM | POA: Diagnosis not present

## 2022-10-16 DIAGNOSIS — G4733 Obstructive sleep apnea (adult) (pediatric): Secondary | ICD-10-CM | POA: Diagnosis not present

## 2022-10-16 DIAGNOSIS — Z7985 Long-term (current) use of injectable non-insulin antidiabetic drugs: Secondary | ICD-10-CM | POA: Diagnosis not present

## 2022-10-16 DIAGNOSIS — Z7982 Long term (current) use of aspirin: Secondary | ICD-10-CM | POA: Diagnosis not present

## 2022-10-16 DIAGNOSIS — E1122 Type 2 diabetes mellitus with diabetic chronic kidney disease: Secondary | ICD-10-CM | POA: Diagnosis not present

## 2022-10-16 DIAGNOSIS — I130 Hypertensive heart and chronic kidney disease with heart failure and stage 1 through stage 4 chronic kidney disease, or unspecified chronic kidney disease: Secondary | ICD-10-CM | POA: Diagnosis not present

## 2022-10-16 DIAGNOSIS — L89310 Pressure ulcer of right buttock, unstageable: Secondary | ICD-10-CM | POA: Diagnosis not present

## 2022-10-16 DIAGNOSIS — M3500 Sicca syndrome, unspecified: Secondary | ICD-10-CM | POA: Diagnosis not present

## 2022-10-16 DIAGNOSIS — I5032 Chronic diastolic (congestive) heart failure: Secondary | ICD-10-CM | POA: Diagnosis not present

## 2022-10-16 DIAGNOSIS — M545 Low back pain, unspecified: Secondary | ICD-10-CM | POA: Diagnosis not present

## 2022-10-16 DIAGNOSIS — J329 Chronic sinusitis, unspecified: Secondary | ICD-10-CM | POA: Diagnosis not present

## 2022-10-16 DIAGNOSIS — M9701XD Periprosthetic fracture around internal prosthetic right hip joint, subsequent encounter: Secondary | ICD-10-CM | POA: Diagnosis not present

## 2022-10-16 DIAGNOSIS — Z9181 History of falling: Secondary | ICD-10-CM | POA: Diagnosis not present

## 2022-10-16 DIAGNOSIS — K219 Gastro-esophageal reflux disease without esophagitis: Secondary | ICD-10-CM | POA: Diagnosis not present

## 2022-10-16 DIAGNOSIS — N1832 Chronic kidney disease, stage 3b: Secondary | ICD-10-CM | POA: Diagnosis not present

## 2022-10-16 DIAGNOSIS — M069 Rheumatoid arthritis, unspecified: Secondary | ICD-10-CM | POA: Diagnosis not present

## 2022-10-16 DIAGNOSIS — M80051D Age-related osteoporosis with current pathological fracture, right femur, subsequent encounter for fracture with routine healing: Secondary | ICD-10-CM | POA: Diagnosis not present

## 2022-10-16 DIAGNOSIS — L89612 Pressure ulcer of right heel, stage 2: Secondary | ICD-10-CM | POA: Diagnosis not present

## 2022-10-16 DIAGNOSIS — G8929 Other chronic pain: Secondary | ICD-10-CM | POA: Diagnosis not present

## 2022-10-17 ENCOUNTER — Ambulatory Visit: Payer: PPO | Admitting: Nurse Practitioner

## 2022-10-18 NOTE — Progress Notes (Signed)
[name], [name] ([id number]) [phone].pdf Page 1 of 10 Visit Report for 10/12/2022 Arrival Information Details Patient Name: Date of Service: [name], [name] 10/12/2022 1:15 PM Medical Record Number: [id number] Patient Account Number: [id number] Date of Birth/Sex: Treating RN: Apr 08, 1951 (72 y.o. F) Primary Care Katherine Syme: Leretha Pol Other Clinician: Referring Geovanna Simko: Treating Ezma Rehm/Extender: Olin Pia in Treatment: 5 Visit Information History Since Last Visit All ordered tests and consults were completed: No Patient Arrived: Wheel Chair Added or deleted any medications: No Arrival Time: 13:15 Any new allergies or adverse reactions: No Accompanied By: daughter Had a fall or experienced change in No Transfer Assistance: None activities of daily living that may affect Patient Identification Verified: Yes risk of falls: Secondary Verification Process Completed: Yes Signs or symptoms of abuse/neglect since last visito No Patient Requires Transmission-Based Precautions: No Hospitalized since last visit: No Patient Has Alerts: No Implantable device outside of the clinic excluding No cellular tissue based products placed in the center since last visit: Has Dressing in Place as Prescribed: Yes Pain Present Now: No Electronic Signature(s) Signed: 10/12/2022 4:59:17 PM By: Baruch Gouty RN, BSN Entered By: Baruch Gouty on 10/12/2022 13:39:10 -------------------------------------------------------------------------------- Clinic Level of Care Assessment Details Patient Name: Date of Service: [name], [name] 10/12/2022 1:15 PM Medical Record Number: [id number] Patient Account Number: [id number] Date of Birth/Sex: Treating RN: [birth year] (72 y.o. [name] Primary Care Branko Steeves: Leretha Pol Other Clinician: Referring Tee Richeson: Treating Josimar Corning/Extender: Olin Pia in Treatment:  5 Clinic Level of Care Assessment Items TOOL 4 Quantity Score '[]'$  - 0 Use when only an EandM is performed on FOLLOW-UP visit ASSESSMENTS - Nursing Assessment / Reassessment X- 1 10 Reassessment of Co-morbidities (includes updates in patient status) X- 1 5 Reassessment of Adherence to Treatment Plan ASSESSMENTS - Wound and Skin A ssessment / Reassessment '[]'$  - 0 Simple Wound Assessment / Reassessment - one wound X- 2 5 Complex Wound Assessment / Reassessment - multiple wounds '[]'$  - 0 Dermatologic / Skin Assessment (not related to wound area) ASSESSMENTS - Focused Assessment X- 1 5 Circumferential Edema Measurements - multi extremities '[]'$  - 0 Nutritional Assessment / Counseling / Intervention X- 1 5 Lower Extremity Assessment (monofilament, tuning fork, pulses) '[]'$  - 0 Peripheral Arterial Disease Assessment (using hand held doppler) ASSESSMENTS - Ostomy and/or Continence Assessment and Care '[]'$  - 0 Incontinence Assessment and Management '[]'$  - 0 Ostomy Care Assessment and Management (repouching, etc.) PROCESS - Coordination of Care [name], [name] ([id number]) [id number] Page 2 of 10 X- 1 15 Simple Patient / Family Education for ongoing care '[]'$  - 0 Complex (extensive) Patient / Family Education for ongoing care X- 1 10 Staff obtains Programmer, systems, Records, T Results / Process Orders est X- 1 10 Staff telephones HHA, Nursing Homes / Clarify orders / etc '[]'$  - 0 Routine Transfer to another Facility (non-emergent condition) '[]'$  - 0 Routine Hospital Admission (non-emergent condition) '[]'$  - 0 New Admissions / Biomedical engineer / Ordering NPWT Apligraf, etc. , '[]'$  - 0 Emergency Hospital Admission (emergent condition) X- 1 10 Simple Discharge Coordination '[]'$  - 0 Complex (extensive) Discharge Coordination PROCESS - Special Needs '[]'$  - 0 Pediatric / Minor Patient Management '[]'$  - 0 Isolation Patient Management '[]'$  - 0 Hearing / Language / Visual special  needs '[]'$  - 0 Assessment of Community assistance (transportation, D/C planning, etc.) '[]'$  - 0 Additional assistance / Altered mentation '[]'$  - 0 Support Surface(s) Assessment (bed, cushion, seat, etc.) INTERVENTIONS - Wound Cleansing / Measurement '[]'$  -  0 Simple Wound Cleansing - one wound X- 2 5 Complex Wound Cleansing - multiple wounds X- 1 5 Wound Imaging (photographs - any number of wounds) '[]'$  - 0 Wound Tracing (instead of photographs) '[]'$  - 0 Simple Wound Measurement - one wound X- 2 5 Complex Wound Measurement - multiple wounds INTERVENTIONS - Wound Dressings X - Small Wound Dressing one or multiple wounds 2 10 '[]'$  - 0 Medium Wound Dressing one or multiple wounds '[]'$  - 0 Large Wound Dressing one or multiple wounds X- 1 5 Application of Medications - topical '[]'$  - 0 Application of Medications - injection INTERVENTIONS - Miscellaneous '[]'$  - 0 External ear exam '[]'$  - 0 Specimen Collection (cultures, biopsies, blood, body fluids, etc.) '[]'$  - 0 Specimen(s) / Culture(s) sent or taken to Lab for analysis '[]'$  - 0 Patient Transfer (multiple staff / Civil Service fast streamer / Similar devices) '[]'$  - 0 Simple Staple / Suture removal (25 or less) '[]'$  - 0 Complex Staple / Suture removal (26 or more) '[]'$  - 0 Hypo / Hyperglycemic Management (close monitor of Blood Glucose) '[]'$  - 0 Ankle / Brachial Index (ABI) - do not check if billed separately X- 1 5 Vital Signs Has the patient been seen at the hospital within the last three years: Yes Total Score: 135 Level Of Care: New/Established - Level 4 Electronic Signature(s) Signed: 10/12/2022 4:59:17 PM By: Baruch Gouty RN, BSN Rowan, Rudi Rummage ([id number]) [id number] Page 3 of 10 Entered By: Baruch Gouty on 10/12/2022 14:16:02 -------------------------------------------------------------------------------- Encounter Discharge Information Details Patient Name: Date of Service: [name], [name] 10/12/2022 1:15 PM Medical Record  Number: [id number] Patient Account Number: [id number] Date of Birth/Sex: Treating RN: [birth year] (72 y.o. [name] Primary Care Maia Handa: Leretha Pol Other Clinician: Referring Modelle Vollmer: Treating Catia Todorov/Extender: Olin Pia in Treatment: 5 Encounter Discharge Information Items Discharge Condition: Stable Ambulatory Status: Wheelchair Discharge Destination: Home Transportation: Private Auto Accompanied By: daughter Schedule Follow-up Appointment: Yes Clinical Summary of Care: Patient Declined Electronic Signature(s) Signed: 10/12/2022 4:59:17 PM By: Baruch Gouty RN, BSN Entered By: Baruch Gouty on 10/12/2022 14:28:07 -------------------------------------------------------------------------------- Lower Extremity Assessment Details Patient Name: Date of Service: [name], [name] 10/12/2022 1:15 PM Medical Record Number: [id number] Patient Account Number: [id number] Date of Birth/Sex: Treating RN: 13-Feb-1951 (72 y.o. [name] Primary Care Nautia Lem: Leretha Pol Other Clinician: Referring Damien [name]: Treating Ladaisha Portillo/Extender: Olin Pia in Treatment: 5 Edema Assessment Assessed: [Left: No] [Right: No] [Left: Edema] [Right: :] Calf Left: Right: Point of Measurement: From Medial Instep 38.5 cm Ankle Left: Right: Point of Measurement: From Medial Instep 25.5 cm Vascular Assessment Pulses: Dorsalis Pedis Palpable: [Right:Yes] Electronic Signature(s) Signed: 10/12/2022 4:59:17 PM By: Baruch Gouty RN, BSN Entered By: Baruch Gouty on 10/12/2022 13:42:20 -------------------------------------------------------------------------------- Multi Wound Chart Details Patient Name: Date of Service: [name] 10/12/2022 1:15 PM Medical Record Number: [id number] Patient Account Number: [id number] Date of Birth/Sex: Treating RN: 26-Jul-1951 (72 y.o. F) Primary Care Danett Palazzo: Leretha Pol  Other Clinician: Referring Nicki Furlan: Treating Zyriah Mask/Extender: Josephina, Hattar ([id number]) [id number] Page 4 of 10 Weeks in Treatment: 5 Vital Signs Height(in): 62 Pulse(bpm): 71 Weight(lbs): 207 Blood Pressure(mmHg): 117/58 Body Mass Index(BMI): 37.9 Temperature(F): 98.2 Respiratory Rate(breaths/min): 20 [1:Photos:] [N/A:N/A] Right Gluteus Right Calcaneus N/A Wound Location: Pressure Injury Pressure Injury N/A Wounding Event: Pressure Ulcer Pressure Ulcer N/A Primary Etiology: Chronic sinus problems/congestion, Chronic sinus problems/congestion, N/A Comorbid History: Sleep Apnea, Congestive Heart Sleep Apnea, Congestive Heart Failure, Raynauds, Rheumatoid Failure, Raynauds, Rheumatoid  Arthritis, Neuropathy Arthritis, Neuropathy 08/11/2022 09/21/2022 N/A Date Acquired: 5 2 N/A Weeks of Treatment: Open Open N/A Wound Status: No No N/A Wound Recurrence: 3x6.4x5.6 2.6x3.1x0.1 N/A Measurements L x W x D (cm) 15.08 6.33 N/A A (cm) : rea 84.446 0.633 N/A Volume (cm) : 31.40% 34.50% N/A % Reduction in A rea: 36.00% 34.50% N/A % Reduction in Volume: 10 Starting Position 1 (o'clock): 2 Ending Position 1 (o'clock): 10.8 Maximum Distance 1 (cm): Yes No N/A Undermining: Category/Stage IV Unstageable/Unclassified N/A Classification: Large None Present N/A Exudate A mount: Serosanguineous N/A N/A Exudate Type: red, brown N/A N/A Exudate Color: Distinct, outline attached Flat and Intact N/A Wound Margin: Large (67-100%) None Present (0%) N/A Granulation A mount: Red N/A N/A Granulation Quality: Small (1-33%) Large (67-100%) N/A Necrotic A mount: Adherent Slough Eschar N/A Necrotic Tissue: Fat Layer (Subcutaneous Tissue): Yes Fat Layer (Subcutaneous Tissue): Yes N/A Exposed Structures: Fascia: No Fascia: No Tendon: No Tendon: No Muscle: No Muscle: No Joint: No Joint: No Bone: No Bone:  No None Small (1-33%) N/A Epithelialization: Excoriation: No No Abnormalities Noted N/A Periwound Skin Texture: Induration: No Callus: No Crepitus: No Rash: No Scarring: No Maceration: No Maceration: No N/A Periwound Skin Moisture: Dry/Scaly: No Dry/Scaly: No Atrophie Blanche: No No Abnormalities Noted N/A Periwound Skin Color: Cyanosis: No Ecchymosis: No Erythema: No Hemosiderin Staining: No Mottled: No Pallor: No Rubor: No No Abnormality No Abnormality N/A Temperature: Yes Yes N/A Tenderness on Palpation: Treatment Notes Electronic Signature(s) Signed: 10/12/2022 2:21:26 PM By: Fredirick Maudlin MD FACS Entered By: Fredirick Maudlin on 10/12/2022 14:21:26 [name] ([id number]) [id number] Page 5 of 10 -------------------------------------------------------------------------------- Multi-Disciplinary Care Plan Details Patient Name: Date of Service: [name], [name] 10/12/2022 1:15 PM Medical Record Number: [id number] Patient Account Number: [id number] Date of Birth/Sex: Treating RN: 1951-02-08 (72 y.o. [name] Primary Care Geovani Tootle: Leretha Pol Other Clinician: Referring Kerri-Anne Haeberle: Treating Ladan Vanderzanden/Extender: Olin Pia in Treatment: 5 Multidisciplinary Care Plan reviewed with physician Active Inactive Medication Nursing Diagnoses: Knowledge deficit related to medication safety: actual or potential Goals: Patient/caregiver will demonstrate understanding of all current medications Date Initiated: 09/15/2022 T arget Resolution Date: 01/12/2023 Goal Status: Active Interventions: Assess patient/caregiver ability to manage medication regimen upon admission and as needed Patient/Caregiver given reconciled medication list upon admission, changes in medications and discharge from the [place]: New medication prescribed at [place] : 09/15/2022 Notes: Pressure Nursing  Diagnoses: Knowledge deficit related to management of pressures ulcers Goals: Patient will remain free of pressure ulcers Date Initiated: 09/15/2022 Date Inactivated: 09/28/2022 Target Resolution Date: 11/09/2022 Unmet Reason: new pressure ulcer Goal Status: Unmet right heel Patient/caregiver will verbalize risk factors for pressure ulcer development Date Initiated: 09/15/2022 Target Resolution Date: 01/12/2023 Goal Status: Active Interventions: Assess potential for pressure ulcer upon admission and as needed Treatment Activities: Patient referred for pressure reduction/relief devices : 09/15/2022 Pressure reduction/relief device ordered : 09/15/2022 Notes: Wound/Skin Impairment Nursing Diagnoses: Impaired tissue integrity Knowledge deficit related to ulceration/compromised skin integrity Goals: Patient/caregiver will verbalize understanding of skin care regimen Date Initiated: 09/28/2022 Target Resolution Date: 11/02/2022 Goal Status: Active Ulcer/skin breakdown will have a volume reduction of 50% by week 8 Date Initiated: 09/28/2022 Target Resolution Date: 11/02/2022 Goal Status: Active Interventions: Assess patient/caregiver ability to obtain necessary supplies Assess patient/caregiver ability to perform ulcer/skin care regimen upon admission and as needed [name], [name] ([id number]) [phone].pdf Page 6 of 10 Assess ulceration(s) every visit Provide education on ulcer and skin care Treatment Activities: Skin care  regimen initiated : 09/28/2022 Topical wound management initiated : 09/28/2022 Notes: Electronic Signature(s) Signed: 10/12/2022 4:59:17 PM By: Baruch Gouty RN, BSN Entered By: Baruch Gouty on 10/12/2022 13:54:15 -------------------------------------------------------------------------------- Pain Assessment Details Patient Name: Date of Service: [name], [name] 10/12/2022 1:15 PM Medical Record Number: [id number] Patient Account Number:  [id number] Date of Birth/Sex: Treating RN: [birth year] (72 y.o. F) Primary Care Shekia Kuper: Leretha Pol Other Clinician: Referring Teleshia Lemere: Treating Merlie Noga/Extender: Olin Pia in Treatment: 5 Active Problems Location of Pain Severity and Description of Pain Patient Has Paino Yes Site Locations Rate the pain. Current Pain Level: 2 Worst Pain Level: 10 Least Pain Level: 0 Tolerable Pain Level: 0 Character of Pain Describe the Pain: Aching Pain Management and Medication Current Pain Management: Medication: Yes Other: Tylenol prn Electronic Signature(s) Signed: 10/12/2022 4:59:17 PM By: Baruch Gouty RN, BSN Entered By: Baruch Gouty on 10/12/2022 13:41:02 -------------------------------------------------------------------------------- Patient/Caregiver Education Details Patient Name: Date of Service: [name] 2/29/2024andnbsp1:15 PM Medical Record Number: [id number] Patient Account Number: [id number] Date of Birth/Gender: Treating RN: May 09, 1951 (72 y.o. [name] Primary Care Physician: Leretha Pol Other Clinician: Referring Physician: Treating Physician/Extender: Olin Pia in Treatment: 5 Education [address] HENRETTA, CLAREY ([id number]) [id number] Page 7 of 10 Education Provided To: Patient Education Topics Provided Pressure: Methods: Explain/Verbal Responses: Reinforcements needed, State content correctly Wound/Skin Impairment: Methods: Explain/Verbal Responses: Reinforcements needed, State content correctly Electronic Signature(s) Signed: 10/12/2022 4:59:17 PM By: Baruch Gouty RN, BSN Entered By: Baruch Gouty on 10/12/2022 13:55:12 -------------------------------------------------------------------------------- Wound Assessment Details Patient Name: Date of Service: [name], [name] 10/12/2022 1:15 PM Medical Record Number: [id number] Patient  Account Number: [id number] Date of Birth/Sex: Treating RN: 08/14/1951 (72 y.o. F) Primary Care Efraim Vanallen: Leretha Pol Other Clinician: Referring Latrel Szymczak: Treating Tarquin Welcher/Extender: Olin Pia in Treatment: 5 Wound Status Wound Number: 1 Primary Pressure Ulcer Etiology: Wound Location: Right Gluteus Wound Open Wounding Event: Pressure Injury Status: Date Acquired: 08/11/2022 Comorbid Chronic sinus problems/congestion, Sleep Apnea, Congestive Heart Weeks Of Treatment: 5 History: Failure, Raynauds, Rheumatoid Arthritis, Neuropathy Clustered Wound: No Photos Wound Measurements Length: (cm) 3 Width: (cm) 6.4 Depth: (cm) 5.6 Area: (cm) 15.08 Volume: (cm) 84.446 % Reduction in Area: 31.4% % Reduction in Volume: 36% Epithelialization: None Tunneling: No Undermining: Yes Starting Position (o'clock): 10 Ending Position (o'clock): 2 Maximum Distance: (cm) 10.8 Wound Description Classification: Category/Stage IV Wound Margin: Distinct, outline attached Exudate Amount: Large Exudate Type: Serosanguineous Exudate Color: red, brown Foul Odor After Cleansing: No Slough/Fibrino Yes Wound Bed Granulation Amount: Large (67-100%) Exposed Structure Granulation Quality: Red Fascia Exposed: No [name], [name] ([id number]) [phone].pdf Page 8 of 10 Necrotic Amount: Small (1-33%) Fat Layer (Subcutaneous Tissue) Exposed: Yes Necrotic Quality: Adherent Slough Tendon Exposed: No Muscle Exposed: No Joint Exposed: No Bone Exposed: No Periwound Skin Texture Texture Color No Abnormalities Noted: Yes No Abnormalities Noted: Yes Moisture Temperature / Pain No Abnormalities Noted: Yes Temperature: No Abnormality Tenderness on Palpation: Yes Treatment Notes Wound #1 (Gluteus) Wound Laterality: Right Cleanser Wound Cleanser Discharge Instruction: Cleanse the wound with wound cleanser prior to applying a clean dressing using gauze  sponges, not tissue or cotton balls. Peri-Wound Care Topical Keystone antibiotic compound Discharge Instruction: squirt and spray into base of wound before packing Primary Dressing Dakin's Solution 0.25%, 16 (oz) Discharge Instruction: Moisten gauze with Dakin's solution and pack into wound Secondary Dressing Zetuvit Plus Silicone Border Sacrum Dressing, Sm, 7x7 (in/in) Discharge Instruction: Apply silicone border over primary dressing as directed. Secured  With Compression Wrap Compression Stockings Add-Ons Electronic Signature(s) Signed: 10/12/2022 4:59:17 PM By: Baruch Gouty RN, BSN Entered By: Baruch Gouty on 10/12/2022 13:51:20 -------------------------------------------------------------------------------- Wound Assessment Details Patient Name: Date of Service: YUJIN, ANTES 10/12/2022 1:15 PM Medical Record Number: [id number] Patient Account Number: [id number] Date of Birth/Sex: Treating RN: Aug 01, 1951 (72 y.o. [name] Primary Care Jil Penland: Leretha Pol Other Clinician: Referring Choice Kleinsasser: Treating Nedra Mcinnis/Extender: Olin Pia in Treatment: 5 Wound Status Wound Number: 3 Primary Pressure Ulcer Etiology: Wound Location: Right Calcaneus Wound Open Wounding Event: Pressure Injury Status: Date Acquired: 09/21/2022 Comorbid Chronic sinus problems/congestion, Sleep Apnea, Congestive Heart Weeks Of Treatment: 2 History: Failure, Raynauds, Rheumatoid Arthritis, Neuropathy Clustered Wound: No Photos SYMPHANIE, SMALLRIDGE ([id number]) [id number] Page 9 of 10 Wound Measurements Length: (cm) 2.6 Width: (cm) 3.1 Depth: (cm) 0.1 Area: (cm) 6.33 Volume: (cm) 0.633 % Reduction in Area: 34.5% % Reduction in Volume: 34.5% Epithelialization: Small (1-33%) Tunneling: No Undermining: No Wound Description Classification: Unstageable/Unclassified Wound Margin: Flat and Intact Exudate Amount: None  Present Foul Odor After Cleansing: No Slough/Fibrino Yes Wound Bed Granulation Amount: None Present (0%) Exposed Structure Necrotic Amount: Large (67-100%) Fascia Exposed: No Necrotic Quality: Eschar Fat Layer (Subcutaneous Tissue) Exposed: Yes Tendon Exposed: No Muscle Exposed: No Joint Exposed: No Bone Exposed: No Periwound Skin Texture Texture Color No Abnormalities Noted: Yes No Abnormalities Noted: Yes Moisture Temperature / Pain No Abnormalities Noted: No Temperature: No Abnormality Dry / Scaly: No Tenderness on Palpation: Yes Maceration: No Treatment Notes Wound #3 (Calcaneus) Wound Laterality: Right Cleanser Peri-Wound Care Topical Betadine Solution Discharge Instruction: paint right heel with betadine Primary Dressing Secondary Dressing ALLEVYN Heel 4 1/2in x 5 1/2in / 10.5cm x 13.5cm Discharge Instruction: Apply over primary dressing as directed. Woven Gauze Sponge, Non-Sterile 4x4 in Discharge Instruction: Apply over primary dressing as directed. Secured With [employer], 4.5x3.1 (in/yd) Discharge Instruction: Secure with Kerlix as directed. Paper Tape, 2x10 (in/yd) Discharge Instruction: Secure dressing with tape as directed. Compression Wrap Compression Stockings Add-Ons EVELINA, MCMACKIN ([id number]) [phone].pdf Page 10 of 10 Electronic Signature(s) Signed: 10/12/2022 4:59:17 PM By: Baruch Gouty RN, BSN Entered By: Baruch Gouty on 10/12/2022 13:46:24 -------------------------------------------------------------------------------- Vitals Details Patient Name: Date of Service: CA MARYELLA, SIEGMUND 10/12/2022 1:15 PM Medical Record Number: [id number] Patient Account Number: [id number] Date of Birth/Sex: Treating RN: August 29, 1950 (72 y.o. F) Primary Care Janellie Tennison: Leretha Pol Other Clinician: Referring Andrej Spagnoli: Treating Pierre Cumpton/Extender: Olin Pia in Treatment: 5 Vital Signs Time  Taken: 01:15 Temperature (F): 98.2 Height (in): 62 Pulse (bpm): 71 Weight (lbs): 207 Respiratory Rate (breaths/min): 20 Body Mass Index (BMI): 37.9 Blood Pressure (mmHg): 117/58 Reference Range: 80 - 120 mg / dl Electronic Signature(s) Signed: 10/12/2022 4:59:17 PM By: Baruch Gouty RN, BSN Entered By: Baruch Gouty on 10/12/2022 13:39:23

## 2022-10-19 ENCOUNTER — Encounter (HOSPITAL_BASED_OUTPATIENT_CLINIC_OR_DEPARTMENT_OTHER): Payer: PPO | Attending: General Surgery | Admitting: General Surgery

## 2022-10-19 DIAGNOSIS — L89610 Pressure ulcer of right heel, unstageable: Secondary | ICD-10-CM | POA: Diagnosis not present

## 2022-10-19 DIAGNOSIS — I5032 Chronic diastolic (congestive) heart failure: Secondary | ICD-10-CM | POA: Insufficient documentation

## 2022-10-19 DIAGNOSIS — N1832 Chronic kidney disease, stage 3b: Secondary | ICD-10-CM | POA: Insufficient documentation

## 2022-10-19 DIAGNOSIS — E785 Hyperlipidemia, unspecified: Secondary | ICD-10-CM | POA: Insufficient documentation

## 2022-10-19 DIAGNOSIS — G4730 Sleep apnea, unspecified: Secondary | ICD-10-CM | POA: Insufficient documentation

## 2022-10-19 DIAGNOSIS — I130 Hypertensive heart and chronic kidney disease with heart failure and stage 1 through stage 4 chronic kidney disease, or unspecified chronic kidney disease: Secondary | ICD-10-CM | POA: Diagnosis not present

## 2022-10-19 DIAGNOSIS — M069 Rheumatoid arthritis, unspecified: Secondary | ICD-10-CM | POA: Insufficient documentation

## 2022-10-19 DIAGNOSIS — G629 Polyneuropathy, unspecified: Secondary | ICD-10-CM | POA: Diagnosis not present

## 2022-10-19 DIAGNOSIS — I7300 Raynaud's syndrome without gangrene: Secondary | ICD-10-CM | POA: Insufficient documentation

## 2022-10-19 DIAGNOSIS — L89314 Pressure ulcer of right buttock, stage 4: Secondary | ICD-10-CM | POA: Insufficient documentation

## 2022-10-21 NOTE — Progress Notes (Signed)
LARIANA, PULLAN ([id number]) [id number] Page 1 of 9 Visit Report for 10/19/2022 Arrival Information Details Patient Name: Date of Service: NIKELLE, TUSS 10/19/2022 12:30 PM Medical Record Number: [id number] Patient Account Number: [id number] Date of Birth/Sex: Treating RN: 1951-06-05 (72 y.o. [name] Primary Care Blanchard Willhite: Leretha Pol Other Clinician: Referring Montrelle Eddings: Treating Chloe Miyoshi/Extender: Olin Pia in Treatment: 6 Visit Information History Since Last Visit Added or deleted any medications: No Patient Arrived: Wheel Chair Any new allergies or adverse reactions: No Arrival Time: 12:54 Had a fall or experienced change in No Accompanied By: daughter activities of daily living that may affect Transfer Assistance: None risk of falls: Patient Identification Verified: Yes Signs or symptoms of abuse/neglect since last visito No Secondary Verification Process Completed: Yes Hospitalized since last visit: No Patient Requires Transmission-Based Precautions: No Implantable device outside of the clinic excluding No Patient Has Alerts: No cellular tissue based products placed in the center since last visit: Has Dressing in Place as Prescribed: Yes Pain Present Now: Yes Electronic Signature(s) Signed: 10/19/2022 5:28:22 PM By: Baruch Gouty RN, BSN Entered By: Baruch Gouty on 10/19/2022 12:54:51 -------------------------------------------------------------------------------- Encounter Discharge Information Details Patient Name: Date of Service: [name]. 10/19/2022 12:30 PM Medical Record Number: [id number] Patient Account Number: [id number] Date of Birth/Sex: Treating RN: [birth year] (72 y.o. [name] Primary Care Ashar Lewinski: Leretha Pol Other Clinician: Referring Chatara Lucente: Treating Travelle Mcclimans/Extender: Olin Pia in Treatment: 6 Encounter Discharge Information  Items Post Procedure Vitals Discharge Condition: Stable Temperature (F): 98.4 Ambulatory Status: Wheelchair Pulse (bpm): 78 Discharge Destination: Home Respiratory Rate (breaths/min): 18 Transportation: Private Auto Blood Pressure (mmHg): 143/81 Accompanied By: daughter Schedule Follow-up Appointment: Yes Clinical Summary of Care: Patient Declined Electronic Signature(s) Signed: 10/19/2022 5:28:22 PM By: Baruch Gouty RN, BSN Entered By: Baruch Gouty on 10/19/2022 14:31:12 -------------------------------------------------------------------------------- Lower Extremity Assessment Details Patient Name: Date of Service: OYINDAMOLA, MUNTER 10/19/2022 12:30 PM Medical Record Number: [id number] Patient Account Number: [id number] Date of Birth/Sex: Treating RN: 11/16/1950 (72 y.o. [name] Primary Care Karisa Nesser: Leretha Pol Other Clinician: Referring Madi Bonfiglio: Treating Laiza Veenstra/Extender: Olin Pia in Treatment: 6 Edema Assessment Assessed: [Left: No] [Right: No] C[LeftEUDELL, HOLZKNECHT E [id number] [[id number] Page 2 of 9] Edema: [Left: Ye] [Right: s] Calf Left: Right: Point of Measurement: From Medial Instep 40 cm Ankle Left: Right: Point of Measurement: From Medial Instep 26 cm Vascular Assessment Pulses: Dorsalis Pedis Palpable: [Right:Yes] Electronic Signature(s) Signed: 10/19/2022 5:28:22 PM By: Baruch Gouty RN, BSN Entered By: Baruch Gouty on 10/19/2022 13:00:58 -------------------------------------------------------------------------------- Multi Wound Chart Details Patient Name: Date of Service: [name]. 10/19/2022 12:30 PM Medical Record Number: [id number] Patient Account Number: [id number] Date of Birth/Sex: Treating RN: October 13, 1950 (72 y.o. F) Primary Care Roxi Hlavaty: Leretha Pol Other Clinician: Referring Lynnda Wiersma: Treating Delva Derden/Extender: Olin Pia in Treatment: 6 Vital Signs Height(in): 40 Pulse(bpm): 30 Weight(lbs): 207 Blood Pressure(mmHg): 141/83 Body Mass Index(BMI): 37.9 Temperature(F): 98.4 Respiratory Rate(breaths/min): 18 [1:Photos:] [N/A:N/A] Right Gluteus Right Calcaneus N/A Wound Location: Pressure Injury Pressure Injury N/A Wounding Event: Pressure Ulcer Pressure Ulcer N/A Primary Etiology: Chronic sinus problems/congestion, Chronic sinus problems/congestion, N/A Comorbid History: Sleep Apnea, Congestive Heart Sleep Apnea, Congestive Heart Failure, Raynauds, Rheumatoid Failure, Raynauds, Rheumatoid Arthritis, Neuropathy Arthritis, Neuropathy 08/11/2022 09/21/2022 N/A Date Acquired: 6 3 N/A Weeks of Treatment: Open Open N/A Wound Status: No No N/A Wound Recurrence: 3x5.5x5.5 2.4x3.9x0.1 N/A Measurements L x W x D (cm) 12.959 7.351 N/A A (  cm) : rea 71.275 0.735 N/A Volume (cm) : 41.10% 23.90% N/A % Reduction in A rea: 46.00% 23.90% N/A % Reduction in Volume: 12 Starting Position 1 (o'clock): 2 Ending Position 1 (o'clock): 9.9 Maximum Distance 1 (cm): Yes No N/A Undermining: Category/Stage IV Unstageable/Unclassified N/A Classification: Large Small N/A Exudate A mount: Serosanguineous Serosanguineous N/A Exudate Type: red, brown red, brown N/A Exudate Color: Well defined, not attached Flat and Intact N/A Wound MarginNATESSA, EXUM ([id number]) [id number] Page 3 of 9 Large (67-100%) Small (1-33%) N/A Granulation Amount: Red Red N/A Granulation Quality: Small (1-33%) Large (67-100%) N/A Necrotic Amount: Adherent Slough Eschar N/A Necrotic Tissue: Fat Layer (Subcutaneous Tissue): Yes Fat Layer (Subcutaneous Tissue): Yes N/A Exposed Structures: Fascia: No Fascia: No Tendon: No Tendon: No Muscle: No Muscle: No Joint: No Joint: No Bone: No Bone: No None Small (1-33%) N/A Epithelialization: Debridement - Selective/Open Wound N/A  N/A Debridement: Pre-procedure Verification/Time Out 13:50 N/A N/A Taken: Lidocaine 4% Topical Solution N/A N/A Pain Control: Slough N/A N/A Tissue Debrided: Non-Viable Tissue N/A N/A Level: 16.5 N/A N/A Debridement A (sq cm): rea Curette N/A N/A Instrument: Minimum N/A N/A Bleeding: Pressure N/A N/A Hemostasis A chieved: 0 N/A N/A Procedural Pain: 0 N/A N/A Post Procedural Pain: Procedure was tolerated well N/A N/A Debridement Treatment Response: 3x5.5x5.5 N/A N/A Post Debridement Measurements L x W x D (cm) 71.275 N/A N/A Post Debridement Volume: (cm) Category/Stage IV N/A N/A Post Debridement Stage: Excoriation: No No Abnormalities Noted N/A Periwound Skin Texture: Induration: No Callus: No Crepitus: No Rash: No Scarring: No Maceration: No Maceration: No N/A Periwound Skin Moisture: Dry/Scaly: No Dry/Scaly: No Atrophie Blanche: No No Abnormalities Noted N/A Periwound Skin Color: Cyanosis: No Ecchymosis: No Erythema: No Hemosiderin Staining: No Mottled: No Pallor: No Rubor: No No Abnormality No Abnormality N/A Temperature: Yes Yes N/A Tenderness on Palpation: Debridement N/A N/A Procedures Performed: Negative Pressure Wound Therapy Application (NPWT) Treatment Notes Electronic Signature(s) Signed: 10/19/2022 2:17:01 PM By: Fredirick Maudlin MD FACS Entered By: Fredirick Maudlin on 10/19/2022 14:17:01 -------------------------------------------------------------------------------- Multi-Disciplinary Care Plan Details Patient Name: Date of Service: [name]. 10/19/2022 12:30 PM Medical Record Number: [id number] Patient Account Number: [id number] Date of Birth/Sex: Treating RN: 1951-07-09 (72 y.o. [name] Primary Care Rowena Moilanen: Leretha Pol Other Clinician: Referring Karley Pho: Treating Maricsa Sammons/Extender: Olin Pia in Treatment: [place] reviewed with physician Active  Inactive Pressure Nursing Diagnoses: Knowledge deficit related to management of pressures ulcers Goals: Patient will remain free of pressure ulcers REGINE, GERMER ([id number]) [id number] Page 4 of 9 Date Initiated: 09/15/2022 Date Inactivated: 09/28/2022 Target Resolution Date: 11/09/2022 Unmet Reason: new pressure ulcer Goal Status: Unmet right heel Patient/caregiver will verbalize risk factors for pressure ulcer development Date Initiated: 09/15/2022 Target Resolution Date: 01/12/2023 Goal Status: Active Interventions: Assess potential for pressure ulcer upon admission and as needed Treatment Activities: Patient referred for pressure reduction/relief devices : 09/15/2022 Pressure reduction/relief device ordered : 09/15/2022 Notes: Wound/Skin Impairment Nursing Diagnoses: Impaired tissue integrity Knowledge deficit related to ulceration/compromised skin integrity Goals: Patient/caregiver will verbalize understanding of skin care regimen Date Initiated: 09/28/2022 Target Resolution Date: 11/02/2022 Goal Status: Active Ulcer/skin breakdown will have a volume reduction of 50% by week 8 Date Initiated: 09/28/2022 Target Resolution Date: 11/02/2022 Goal Status: Active Interventions: Assess patient/caregiver ability to obtain necessary supplies Assess patient/caregiver ability to perform ulcer/skin care regimen upon admission and as needed Assess ulceration(s) every visit Provide education on ulcer and skin care Treatment Activities: Skin care regimen initiated :  09/28/2022 Topical wound management initiated : 09/28/2022 Notes: Electronic Signature(s) Signed: 10/19/2022 5:28:22 PM By: Baruch Gouty RN, BSN Entered By: Baruch Gouty on 10/19/2022 13:13:07 -------------------------------------------------------------------------------- Negative Pressure Wound Therapy Application (NPWT) Details Patient Name: Date of Service: PARISSA, ALONSO 10/19/2022 12:30  PM Medical Record Number: [id number] Patient Account Number: [id number] Date of Birth/Sex: Treating RN: 1950/12/24 (72 y.o. [name] Primary Care Gabreille Dardis: Leretha Pol Other Clinician: Referring Adaeze Better: Treating Tanisa Lagace/Extender: Olin Pia in Treatment: 6 NPWT Application Performed for: Wound #1 Right Gluteus Additional Injuries Covered: No Performed By: Baruch Gouty, RN Type: VAC System Coverage Size (sq cm): 16.5 Pressure Type: Constant Pressure Setting: 125 mmHG Drain Type: None Primary Contact: None Quantity of Sponges/Gauze Inserted: 1 Sponge/Dressing Type: Foam- Black Date Initiated: 10/16/2022 Response to Treatment: good Post Procedure Diagnosis Same as Pre-procedure Notes CHRISTINNA, CHATTMAN ([id number]) [id number] Page 5 of 9 initiated by home health Electronic Signature(s) Signed: 10/19/2022 5:28:22 PM By: Baruch Gouty RN, BSN Entered By: Baruch Gouty on 10/19/2022 13:22:31 -------------------------------------------------------------------------------- Pain Assessment Details Patient Name: Date of Service: GRACIELYNN, SHUE 10/19/2022 12:30 PM Medical Record Number: [id number] Patient Account Number: [id number] Date of Birth/Sex: Treating RN: 07/07/1951 (72 y.o. [name] Primary Care Jamorion Gomillion: Leretha Pol Other Clinician: Referring Devine Dant: Treating Azariel Banik/Extender: Olin Pia in Treatment: 6 Active Problems Location of Pain Severity and Description of Pain Patient Has Paino Yes Site Locations Pain Location: Pain in Ulcers With Dressing Change: Yes Duration of the Pain. Constant / Intermittento Intermittent Rate the pain. Current Pain Level: 0 Worst Pain Level: 3 Least Pain Level: 0 Character of Pain Describe the Pain: Aching Pain Management and Medication Current Pain Management: Medication: Yes Is the Current Pain Management Adequate:  Adequate How does your wound impact your activities of daily livingo Sleep: No Bathing: No Appetite: No Relationship With Others: No Bladder Continence: No Emotions: No Bowel Continence: No Work: No Toileting: No Drive: No Dressing: No Hobbies: No Electronic Signature(s) Signed: 10/19/2022 5:28:22 PM By: Baruch Gouty RN, BSN Entered By: Baruch Gouty on 10/19/2022 12:57:24 -------------------------------------------------------------------------------- Patient/Caregiver Education Details Patient Name: Date of Service: [name] 3/7/2024andnbsp12:30 PM Medical Record Number: [id number] Patient Account Number: [id number] Date of Birth/Gender: Treating RN: 09/01/1950 (71 y.o. [name] Primary Care Physician: Leretha Pol Other Clinician: Referring Physician: Treating Physician/Extender: Olin Pia in Treatment: 6 Education [address] KAM, INCLAN ([id number]) [id number] Page 6 of 9 Education Provided To: Patient Education Topics Provided Pressure: Methods: Explain/Verbal Responses: Reinforcements needed, State content correctly Wound/Skin Impairment: Methods: Explain/Verbal Responses: Reinforcements needed, State content correctly Electronic Signature(s) Signed: 10/19/2022 5:28:22 PM By: Baruch Gouty RN, BSN Entered By: Baruch Gouty on 10/19/2022 13:14:26 -------------------------------------------------------------------------------- Wound Assessment Details Patient Name: Date of Service: [name]. 10/19/2022 12:30 PM Medical Record Number: [id number] Patient Account Number: [id number] Date of Birth/Sex: Treating RN: 11/30/1950 (72 y.o. [name] Primary Care Ceyda Peterka: Leretha Pol Other Clinician: Referring Antiono Ettinger: Treating Jarmar Rousseau/Extender: Olin Pia in Treatment: 6 Wound Status Wound Number: 1 Primary Pressure  Ulcer Etiology: Wound Location: Right Gluteus Wound Open Wounding Event: Pressure Injury Status: Date Acquired: 08/11/2022 Comorbid Chronic sinus problems/congestion, Sleep Apnea, Congestive Heart Weeks Of Treatment: 6 History: Failure, Raynauds, Rheumatoid Arthritis, Neuropathy Clustered Wound: No Photos Wound Measurements Length: (cm) 3 Width: (cm) 5.5 Depth: (cm) 5.5 Area: (cm) 12.959 Volume: (cm) 71.275 % Reduction in Area: 41.1% % Reduction in Volume: 46% Epithelialization: None Tunneling: No Undermining: Yes  Starting Position (o'clock): 12 Ending Position (o'clock): 2 Maximum Distance: (cm) 9.9 Wound Description Classification: Category/Stage IV Wound Margin: Well defined, not attached Exudate Amount: Large Exudate Type: Serosanguineous Exudate Color: red, brown Foul Odor After Cleansing: No Slough/Fibrino Yes Wound Bed Granulation Amount: Large (67-100%) Exposed MILAYNA, AMERSON ([id number]) [id number] Page 7 of 9 Granulation Quality: Red Fascia Exposed: No Necrotic Amount: Small (1-33%) Fat Layer (Subcutaneous Tissue) Exposed: Yes Necrotic Quality: Adherent Slough Tendon Exposed: No Muscle Exposed: No Joint Exposed: No Bone Exposed: No Periwound Skin Texture Texture Color No Abnormalities Noted: Yes No Abnormalities Noted: Yes Moisture Temperature / Pain No Abnormalities Noted: Yes Temperature: No Abnormality Tenderness on Palpation: Yes Treatment Notes Wound #1 (Gluteus) Wound Laterality: Right Cleanser Wound Cleanser Discharge Instruction: Cleanse the wound with wound cleanser prior to applying a clean dressing using gauze sponges, not tissue or cotton balls. Peri-Wound Care Topical Keystone antibiotic compound Discharge Instruction: squirt and spray into base of wound before packing Primary Dressing VAC Secondary Dressing Zetuvit Plus Silicone Border Sacrum Dressing, Sm, 7x7 (in/in) Discharge  Instruction: Apply silicone border over primary dressing as directed. Secured With Compression Wrap Compression Stockings Environmental education officer) Signed: 10/19/2022 5:28:22 PM By: Baruch Gouty RN, BSN Entered By: Baruch Gouty on 10/19/2022 13:11:55 -------------------------------------------------------------------------------- Wound Assessment Details Patient Name: Date of Service: KERRYL, DEVONSHIRE 10/19/2022 12:30 PM Medical Record Number: [id number] Patient Account Number: [id number] Date of Birth/Sex: Treating RN: 1951/05/22 (72 y.o. [name] Primary Care Jarelly Rinck: Leretha Pol Other Clinician: Referring Aniruddh Ciavarella: Treating Criag Wicklund/Extender: Olin Pia in Treatment: 6 Wound Status Wound Number: 3 Primary Pressure Ulcer Etiology: Wound Location: Right Calcaneus Wound Open Wounding Event: Pressure Injury Status: Date Acquired: 09/21/2022 Comorbid Chronic sinus problems/congestion, Sleep Apnea, Congestive Heart Weeks Of Treatment: 3 History: Failure, Raynauds, Rheumatoid Arthritis, Neuropathy Clustered Wound: No Photos CHARLENE, HASBUN ([id number]) [id number] Page 8 of 9 Wound Measurements Length: (cm) 2.4 Width: (cm) 3.9 Depth: (cm) 0.1 Area: (cm) 7.351 Volume: (cm) 0.735 % Reduction in Area: 23.9% % Reduction in Volume: 23.9% Epithelialization: Small (1-33%) Tunneling: No Undermining: No Wound Description Classification: Unstageable/Unclassified Wound Margin: Flat and Intact Exudate Amount: Small Exudate Type: Serosanguineous Exudate Color: red, brown Foul Odor After Cleansing: No Slough/Fibrino Yes Wound Bed Granulation Amount: Small (1-33%) Exposed Structure Granulation Quality: Red Fascia Exposed: No Necrotic Amount: Large (67-100%) Fat Layer (Subcutaneous Tissue) Exposed: Yes Necrotic Quality: Eschar Tendon Exposed: No Muscle Exposed: No Joint Exposed: No Bone Exposed:  No Periwound Skin Texture Texture Color No Abnormalities Noted: Yes No Abnormalities Noted: Yes Moisture Temperature / Pain No Abnormalities Noted: No Temperature: No Abnormality Dry / Scaly: No Tenderness on Palpation: Yes Maceration: No Treatment Notes Wound #3 (Calcaneus) Wound Laterality: Right Cleanser Peri-Wound Care Topical Betadine Solution Discharge Instruction: paint right heel with betadine Primary Dressing Secondary Dressing ALLEVYN Heel 4 1/2in x 5 1/2in / 10.5cm x 13.5cm Discharge Instruction: Apply over primary dressing as directed. Woven Gauze Sponge, Non-Sterile 4x4 in Discharge Instruction: Apply over primary dressing as directed. Secured With [employer], 4.5x3.1 (in/yd) Discharge Instruction: Secure with Kerlix as directed. Paper Tape, 2x10 (in/yd) Discharge Instruction: Secure dressing with tape as directed. Compression Wrap Compression Stockings YULINDA, SCOVEL ([id number]) [id number].pdf Page 9 of 9 Add-Ons Electronic Signature(s) Signed: 10/19/2022 5:28:22 PM By: Baruch Gouty RN, BSN Entered By: Baruch Gouty on 10/19/2022 13:12:30 -------------------------------------------------------------------------------- Vitals Details Patient Name: Date of Service: [place] Verda Cumins. 10/19/2022 12:30 PM Medical Record Number: [id number] Patient Account Number: [id number] Date of  Birth/Sex: Treating RN: 08-20-1950 (72 y.o. [name] Primary Care Voncile Schwarz: Leretha Pol Other Clinician: Referring Ruchi Stoney: Treating Toren Tucholski/Extender: Olin Pia in Treatment: 6 Vital Signs Time Taken: 12:54 Temperature (F): 98.4 Height (in): 62 Pulse (bpm): 78 Weight (lbs): 207 Respiratory Rate (breaths/min): 18 Body Mass Index (BMI): 37.9 Blood Pressure (mmHg): 141/83 Reference Range: 80 - 120 mg / dl Electronic Signature(s) Signed: 10/19/2022 5:28:22 PM By: Baruch Gouty RN, BSN Entered By:  Baruch Gouty on 10/19/2022 12:56:39

## 2022-10-21 NOTE — Progress Notes (Signed)
Marisa, Cooper ([id number]) [id number] Page 1 of 10 Visit Report for 10/19/2022 Chief Complaint Document Details Patient Name: Date of Service: [name], [name] 10/19/2022 12:30 PM Medical Record Number: [id number] Patient Account Number: [id number] Date of Birth/Sex: Treating RN: [birth year] (72 y.o. F) Primary Care Provider: Leretha Pol Other Clinician: Referring Provider: Treating Provider/Extender: Olin Pia in Treatment: 6 Information Obtained from: Patient Chief Complaint Patient is at the clinic for treatment of open pressure ulcers Electronic Signature(s) Signed: 10/19/2022 2:17:07 PM By: Fredirick Maudlin MD FACS Entered By: Fredirick Maudlin on 10/19/2022 14:17:07 -------------------------------------------------------------------------------- Debridement Details Patient Name: Date of Service: [name]. 10/19/2022 12:30 PM Medical Record Number: [id number] Patient Account Number: [id number] Date of Birth/Sex: Treating RN: 1951-03-01 (72 y.o. [name], [name] Primary Care Provider: Leretha Pol Other Clinician: Referring Provider: Treating Provider/Extender: Olin Pia in Treatment: 6 Debridement Performed for Assessment: Wound #1 Right Gluteus Performed By: Physician Fredirick Maudlin, MD Debridement Type: Debridement Level of Consciousness (Pre-procedure): Awake and Alert Pre-procedure Verification/Time Out Yes - 13:50 Taken: Start Time: 13:52 Pain Control: Lidocaine 4% T opical Solution T Area Debrided (L x W): otal 3 (cm) x 5.5 (cm) = 16.5 (cm) Tissue and other material debrided: Non-Viable, Slough, Biofilm, Slough Level: Non-Viable Tissue Debridement Description: Selective/Open Wound Instrument: Curette Bleeding: Minimum Hemostasis Achieved: Pressure Procedural Pain: 0 Post Procedural Pain: 0 Response to Treatment: Procedure was tolerated well Level of Consciousness  (Post- Awake and Alert procedure): Post Debridement Measurements of Total Wound Length: (cm) 3 Stage: Category/Stage IV Width: (cm) 5.5 Depth: (cm) 5.5 Volume: (cm) 71.275 Character of Wound/Ulcer Post Debridement: Improved Post Procedure Diagnosis Same as Pre-procedure Notes scribed for Dr. Celine Ahr by Baruch Gouty, RN Electronic Signature(s) Signed: 10/19/2022 4:22:40 PM By: Fredirick Maudlin MD FACS Signed: 10/19/2022 5:28:22 PM By: Baruch Gouty RN, BSN Entered By: Baruch Gouty on 10/19/2022 13:53:22 Marisa, Cooper ([id number]) [id number] Page 2 of 10 -------------------------------------------------------------------------------- HPI Details Patient Name: Date of Service: [name], [name] 10/19/2022 12:30 PM Medical Record Number: [id number] Patient Account Number: [id number] Date of Birth/Sex: Treating RN: April 28, 1951 (72 y.o. F) Primary Care Provider: Leretha Pol Other Clinician: Referring Provider: Treating Provider/Extender: Olin Pia in Treatment: 6 History of Present Illness HPI Description: ADMISSION 09/07/2023 This is a 72 year old woman with a past medical history notable for obesity, congestive heart failure, Raynaud's syndrome, CKD stage IIIb, osteoporosis, and rheumatoid arthritis. In November 2023, she suffered a fall that resulted in a femur fracture. She was hospitalized for about a week and underwent surgical repair of the fracture. She subsequently developed pressure ulcers on her right buttocks and ischium. She has been receiving home health services and they have been applying Medihoney. They have been reporting the wounds as stage II, but on evaluation, the large ulcer was probably unstageable at the time they were evaluating it but it is clearly a stage IV at this point. The smaller ulcer does have fat layer exposure and therefore is a stage III. The patient is accompanied by her daughter. She  says she has been sleeping on a regular bed but recently ordered an air mattress T opper, but does not have it yet. She is on Ozempic for weight loss and therefore has a poor appetite and struggles to get adequate protein intake. 09/15/2022: The large stage IV ulcer is substantially cleaner this week. The stage III ulcer is a little smaller. Both have slough accumulation. The culture that I took last week was  polymicrobial. The Augmentin that I prescribed was adequate coverage for the species and she continues to take this. We have also ordered Keystone topical antibiotic compound, but this has not yet arrived. 09/21/2022: The stage IV ulcer has some necrotic muscle at the base but is otherwise fairly clean. The stage III ulcer is smaller again with light slough on the surface. She has her Keystone topical antibiotic compound with her today. 09/29/2022: There is still some necrotic muscle at the base of the stage IV ulcer that I was unable to get to last week. The stage III ulcer has healed. She unfortunately has developed a new pressure ulcer on her right heel. 10/06/2022: Still with some devitalized muscle at the base of the stage IV ulcer, but otherwise this wound is looking quite clean. The pressure induced tissue injury on her heel is demarcating and much of the area appears to be epithelialized, but there is still 2 areas that remain questionable. 10/12/2022: The stage IV ulcer is very clean and ready for wound VAC, which will be delivered tomorrow according to the patient. The tissue injury on her heel is drying up and does not feel particularly boggy this week. 10/19/2022: The heel injury continues to improve. There is still some dry eschar present on the more plantar aspect of it. There is some slough accumulation on the surface of the stage IV pressure ulcer. The wound VAC was initiated by home health last week. Electronic Signature(s) Signed: 10/19/2022 2:18:24 PM By: Fredirick Maudlin MD  FACS Entered By: Fredirick Maudlin on 10/19/2022 14:18:24 -------------------------------------------------------------------------------- Physical Exam Details Patient Name: Date of Service: [name], [name] 10/19/2022 12:30 PM Medical Record Number: [id number] Patient Account Number: [id number] Date of Birth/Sex: Treating RN: 11-06-1950 (72 y.o. F) Primary Care Provider: Leretha Pol Other Clinician: Referring Provider: Treating Provider/Extender: Olin Pia in Treatment: 6 Constitutional Slightly hypertensive. . . . no acute distress. Respiratory Normal work of breathing on room air. Notes 10/19/2022: The heel injury continues to improve. There is still some dry eschar present on the more plantar aspect of it. There is some slough accumulation on the surface of the stage IV pressure ulcer. Electronic Signature(s) Signed: 10/19/2022 2:20:01 PM By: Fredirick Maudlin MD FACS Entered By: Fredirick Maudlin on 10/19/2022 14:20:01 [name] ([id number]) [id number] Page 3 of 10 -------------------------------------------------------------------------------- Physician Orders Details Patient Name: Date of Service: TANNIS, DYMENT 10/19/2022 12:30 PM Medical Record Number: [id number] Patient Account Number: [id number] Date of Birth/Sex: Treating RN: 1950-08-30 (72 y.o. [name] Primary Care Provider: Leretha Pol Other Clinician: Referring Provider: Treating Provider/Extender: Olin Pia in Treatment: 6 Verbal / Phone Orders: No Diagnosis Coding ICD-10 Coding Code Description L89.314 Pressure ulcer of right buttock, stage 4 L89.610 Pressure ulcer of right heel, unstageable I73.00 Raynaud's syndrome without gangrene 99991111 Chronic diastolic (congestive) heart failure N18.32 Chronic kidney disease, stage 3b R73.02 Impaired glucose tolerance (oral) E66.9 Obesity, unspecified Follow-up  Appointments ppointment in 2 weeks. - Dr. Celine Ahr RM 1 Return A Wednesday 2/20 @ 12:30 pm Anesthetic Wound #1 Right Gluteus (In clinic) Topical Lidocaine 5% applied to wound bed (In clinic) Topical Lidocaine 4% applied to wound bed Wound #3 Right Calcaneus (In clinic) Topical Lidocaine 5% applied to wound bed (In clinic) Topical Lidocaine 4% applied to wound bed Bathing/ Shower/ Hygiene May shower and wash wound with soap and water. Negative Presssure Wound Therapy Wound #1 Right Gluteus Wound Vac to wound continuously at 144m/hg pressure Black Foam Off-Loading Low  air-loss mattress (Group 2) - ordered from [facility] needs hospital bed Turn and reposition every 2 hours - avoid lying on back, stand at least every hour while out of bed, float heels off bed with pillows under calves while in bed Prevalon Boot - right foot esp. while in bed. Additional Orders / Instructions Follow Nutritious Diet - increase protein to 70-80 gms per day, hold ozempic Home Health No change in wound care orders this week; continue Home Health for wound care. May utilize formulary equivalent dressing for wound treatment orders unless otherwise specified. Other Home Health Orders/Instructions: - [facility] Wound Treatment Wound #1 - Gluteus Wound Laterality: Right Cleanser: Wound Cleanser 3 x Per Week/30 Days Discharge Instructions: Cleanse the wound with wound cleanser prior to applying a clean dressing using gauze sponges, not tissue or cotton balls. Topical: Keystone antibiotic compound 3 x Per Week/30 Days Discharge Instructions: squirt and spray into base of wound before packing Prim Dressing: VAC ary 3 x Per Week/30 Days Secondary Dressing: Zetuvit Plus Silicone Border Sacrum Dressing, Sm, 7x7 (in/in) 3 x Per Week/30 Days Discharge Instructions: Apply silicone border over primary dressing as directed. Wound #3 - Calcaneus Wound Laterality: Right Topical: Betadine Solution 1 x Per Day/30  Days Discharge Instructions: paint right heel with betadine ZENDAYA, EICHORN ([id number]) [phone] Page 4 of 10 Secondary Dressing: ALLEVYN Heel 4 1/2in x 5 1/2in / 10.5cm x 13.5cm 1 x Per Day/30 Days Discharge Instructions: Apply over primary dressing as directed. Secondary Dressing: Woven Gauze Sponge, Non-Sterile 4x4 in 1 x Per Day/30 Days Discharge Instructions: Apply over primary dressing as directed. Secured With: [employer], 4.5x3.1 (in/yd) 1 x Per Day/30 Days Discharge Instructions: Secure with Kerlix as directed. Secured With: Paper Tape, 2x10 (in/yd) 1 x Per Day/30 Days Discharge Instructions: Secure dressing with tape as directed. Patient Medications llergies: Sulfa (Sulfonamide Antibiotics), Cymbalta, Demerol, Iodinated Contrast Media, morphine, sulfasalazine, adhesive A Notifications Medication Indication Start End prior to debridement 10/19/2022 lidocaine DOSE topical 4 % cream - cream topical Electronic Signature(s) Signed: 10/19/2022 4:22:40 PM By: Fredirick Maudlin MD FACS Entered By: Fredirick Maudlin on 10/19/2022 14:20:16 -------------------------------------------------------------------------------- Problem List Details Patient Name: Date of Service: [name]. 10/19/2022 12:30 PM Medical Record Number: [id number] Patient Account Number: [id number] Date of Birth/Sex: Treating RN: [birth year] (72 y.o. [name] Primary Care Provider: Leretha Pol Other Clinician: Referring Provider: Treating Provider/Extender: Olin Pia in Treatment: 6 Active Problems ICD-10 Encounter Code Description Active Date MDM Diagnosis L89.314 Pressure ulcer of right buttock, stage 4 09/07/2022 No Yes L89.610 Pressure ulcer of right heel, unstageable 09/28/2022 No Yes I73.00 Raynaud's syndrome without gangrene 09/07/2022 No Yes [id number] Chronic diastolic (congestive) heart failure 09/07/2022 No Yes N18.32 Chronic  kidney disease, stage 3b 09/07/2022 No Yes R73.02 Impaired glucose tolerance (oral) 09/07/2022 No Yes E66.9 Obesity, unspecified 09/07/2022 No Yes Inactive Problems QUINTELLA, BROZOWSKI ([id number]) [id number] Page 5 of 10 ICD-10 Code Description Active Date Inactive Date L89.313 Pressure ulcer of right buttock, stage 3 09/07/2022 09/07/2022 Resolved Problems Electronic Signature(s) Signed: 10/19/2022 2:16:12 PM By: Fredirick Maudlin MD FACS Entered By: Fredirick Maudlin on 10/19/2022 14:16:12 -------------------------------------------------------------------------------- Progress Note Details Patient Name: Date of Service: [name]. 10/19/2022 12:30 PM Medical Record Number: [id number] Patient Account Number: [id number] Date of Birth/Sex: Treating RN: May 23, 1951 (72 y.o. F) Primary Care Provider: Leretha Pol Other Clinician: Referring Provider: Treating Provider/Extender: Olin Pia in Treatment: 6 Subjective Chief Complaint Information obtained from Patient Patient is at  the clinic for treatment of open pressure ulcers History of Present Illness (HPI) ADMISSION 09/07/2023 This is a 72 year old woman with a past medical history notable for obesity, congestive heart failure, Raynaud's syndrome, CKD stage IIIb, osteoporosis, and rheumatoid arthritis. In November 2023, she suffered a fall that resulted in a femur fracture. She was hospitalized for about a week and underwent surgical repair of the fracture. She subsequently developed pressure ulcers on her right buttocks and ischium. She has been receiving home health services and they have been applying Medihoney. They have been reporting the wounds as stage II, but on evaluation, the large ulcer was probably unstageable at the time they were evaluating it but it is clearly a stage IV at this point. The smaller ulcer does have fat layer exposure and therefore is a stage III. The  patient is accompanied by her daughter. She says she has been sleeping on a regular bed but recently ordered an air mattress T opper, but does not have it yet. She is on Ozempic for weight loss and therefore has a poor appetite and struggles to get adequate protein intake. 09/15/2022: The large stage IV ulcer is substantially cleaner this week. The stage III ulcer is a little smaller. Both have slough accumulation. The culture that I took last week was polymicrobial. The Augmentin that I prescribed was adequate coverage for the species and she continues to take this. We have also ordered Keystone topical antibiotic compound, but this has not yet arrived. 09/21/2022: The stage IV ulcer has some necrotic muscle at the base but is otherwise fairly clean. The stage III ulcer is smaller again with light slough on the surface. She has her Keystone topical antibiotic compound with her today. 09/29/2022: There is still some necrotic muscle at the base of the stage IV ulcer that I was unable to get to last week. The stage III ulcer has healed. She unfortunately has developed a new pressure ulcer on her right heel. 10/06/2022: Still with some devitalized muscle at the base of the stage IV ulcer, but otherwise this wound is looking quite clean. The pressure induced tissue injury on her heel is demarcating and much of the area appears to be epithelialized, but there is still 2 areas that remain questionable. 10/12/2022: The stage IV ulcer is very clean and ready for wound VAC, which will be delivered tomorrow according to the patient. The tissue injury on her heel is drying up and does not feel particularly boggy this week. 10/19/2022: The heel injury continues to improve. There is still some dry eschar present on the more plantar aspect of it. There is some slough accumulation on the surface of the stage IV pressure ulcer. The wound VAC was initiated by home health last week. Patient History Information obtained from  Patient, Caregiver, Chart. Family History Cancer - Father,Paternal Grandparents, Heart Disease - Mother,Father,Paternal Grandparents, Hypertension - Mother, Stroke - Maternal Grandparents, No family history of Diabetes, Hereditary Spherocytosis, Kidney Disease, Lung Disease, Seizures, Thyroid Problems, Tuberculosis. 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 Medical History Eyes Denies history of Cataracts, Glaucoma, Optic Neuritis Ear/Nose/Mouth/Throat Patient has history of Chronic sinus problems/congestion - chronic rhinitis Denies history of Middle ear problems Respiratory Patient has history of Sleep Apnea - uses CPAP Cardiovascular Patient has history of Congestive Heart Failure Endocrine LUGARDA, IGNACIO ([id number]) [id number] Page 6 of 10 Denies history of Type I Diabetes,  Type II Diabetes Genitourinary Denies history of End Stage Renal Disease Immunological Patient has history of Raynaudoos Denies history of Lupus Erythematosus, Scleroderma Integumentary (Skin) Denies history of History of Burn Musculoskeletal Patient has history of Rheumatoid Arthritis Denies history of Gout, Osteoarthritis, Osteomyelitis Neurologic Patient has history of Neuropathy Denies history of Dementia, Quadriplegia, Paraplegia, Seizure Disorder Oncologic Denies history of Received Chemotherapy, Received Radiation Psychiatric Denies history of Anorexia/bulimia, Confinement Anxiety Hospitalization/Surgery History - right femur fx ORIR. - right knee replacement. - left breast mass excision. - left ankle tibia repair. - abdominal hysterectomy. - adnoidectomy/tonsillectomy. - cholecystectomy. - dental implants. - patoid cystectomy. - tubal ligation. Medical A Surgical History Notes nd Constitutional Symptoms (General Health) morbid  obesity Cardiovascular hyperlipidemia Gastrointestinal GERD, IBS, eosinophilic esophagitis, diverticulosis, Genitourinary CKD stage 3 Immunological Sjoegren syndrome, fibromyalgia Objective Constitutional Slightly hypertensive. no acute distress. Vitals Time Taken: 12:54 PM, Height: 62 in, Weight: 207 lbs, BMI: 37.9, Temperature: 98.4 F, Pulse: 78 bpm, Respiratory Rate: 18 breaths/min, Blood Pressure: 141/83 mmHg. Respiratory Normal work of breathing on room air. General Notes: 10/19/2022: The heel injury continues to improve. There is still some dry eschar present on the more plantar aspect of it. There is some slough accumulation on the surface of the stage IV pressure ulcer. Integumentary (Hair, Skin) Wound #1 status is Open. Original cause of wound was Pressure Injury. The date acquired was: 08/11/2022. The wound has been in treatment 6 weeks. The wound is located on the Right Gluteus. The wound measures 3cm length x 5.5cm width x 5.5cm depth; 12.959cm^2 area and 71.275cm^3 volume. There is Fat Layer (Subcutaneous Tissue) exposed. There is no tunneling noted, however, there is undermining starting at 12:00 and ending at 2:00 with a maximum distance of 9.9cm. There is a large amount of serosanguineous drainage noted. The wound margin is well defined and not attached to the wound base. There is large (67-100%) red granulation within the wound bed. There is a small (1-33%) amount of necrotic tissue within the wound bed including Adherent Slough. The periwound skin appearance had no abnormalities noted for texture. The periwound skin appearance had no abnormalities noted for moisture. The periwound skin appearance had no abnormalities noted for color. Periwound temperature was noted as No Abnormality. The periwound has tenderness on palpation. Wound #3 status is Open. Original cause of wound was Pressure Injury. The date acquired was: 09/21/2022. The wound has been in treatment 3 weeks.  The wound is located on the Right Calcaneus. The wound measures 2.4cm length x 3.9cm width x 0.1cm depth; 7.351cm^2 area and 0.735cm^3 volume. There is Fat Layer (Subcutaneous Tissue) exposed. There is no tunneling or undermining noted. There is a small amount of serosanguineous drainage noted. The wound margin is flat and intact. There is small (1-33%) red granulation within the wound bed. There is a large (67-100%) amount of necrotic tissue within the wound bed including Eschar. The periwound skin appearance had no abnormalities noted for texture. The periwound skin appearance had no abnormalities noted for color. The periwound skin appearance did not exhibit: Dry/Scaly, Maceration. Periwound temperature was noted as No Abnormality. The periwound has tenderness on palpation. Assessment Active Problems ICD-10 Pressure ulcer of right buttock, stage 4 Pressure ulcer of right heel, unstageable Raynaud's syndrome without gangrene Chronic diastolic (congestive) heart failure Chronic kidney disease, stage 3b JAKEA, WILLETT ([id number]) [id number] Page 7 of 10 Impaired glucose tolerance (oral) Obesity, unspecified Procedures Wound #1 Pre-procedure diagnosis of Wound #1 is a Pressure Ulcer located on the Right Gluteus .  There was a Selective/Open Wound Non-Viable Tissue Debridement with a total area of 16.5 sq cm performed by Fredirick Maudlin, MD. With the following instrument(s): Curette to remove Non-Viable tissue/material. Material removed includes Slough and Biofilm and after achieving pain control using Lidocaine 4% T opical Solution. No specimens were taken. A time out was conducted at 13:50, prior to the start of the procedure. A Minimum amount of bleeding was controlled with Pressure. The procedure was tolerated well with a pain level of 0 throughout and a pain level of 0 following the procedure. Post Debridement Measurements: 3cm length x 5.5cm width x 5.5cm  depth; 71.275cm^3 volume. Post debridement Stage noted as Category/Stage IV. Character of Wound/Ulcer Post Debridement is improved. Post procedure Diagnosis Wound #1: Same as Pre-Procedure General Notes: scribed for Dr. Celine Ahr by Baruch Gouty, RN. Plan Follow-up Appointments: Return Appointment in 2 weeks. - Dr. Celine Ahr RM 1 Wednesday 2/20 @ 12:30 pm Anesthetic: Wound #1 Right Gluteus: (In clinic) Topical Lidocaine 5% applied to wound bed (In clinic) Topical Lidocaine 4% applied to wound bed Wound #3 Right Calcaneus: (In clinic) Topical Lidocaine 5% applied to wound bed (In clinic) Topical Lidocaine 4% applied to wound bed Bathing/ Shower/ Hygiene: May shower and wash wound with soap and water. Negative Presssure Wound Therapy: Wound #1 Right Gluteus: Wound Vac to wound continuously at 129m/hg pressure Black Foam Off-Loading: Low air-loss mattress (Group 2) - ordered from [facility] needs hospital bed Turn and reposition every 2 hours - avoid lying on back, stand at least every hour while out of bed, float heels off bed with pillows under calves while in bed Prevalon Boot - right foot esp. while in bed. Additional Orders / Instructions: Follow Nutritious Diet - increase protein to 70-80 gms per day, hold ozempic Home Health: No change in wound care orders this week; continue Home Health for wound care. May utilize formulary equivalent dressing for wound treatment orders unless otherwise specified. Other Home Health Orders/Instructions: - [facility] The following medication(s) was prescribed: lidocaine topical 4 % cream cream topical for prior to debridement was prescribed at facility WOUND #1: - Gluteus Wound Laterality: Right Cleanser: Wound Cleanser 3 x Per Week/30 Days Discharge Instructions: Cleanse the wound with wound cleanser prior to applying a clean dressing using gauze sponges, not tissue or cotton balls. Topical: Keystone antibiotic compound 3 x Per Week/30 Days Discharge  Instructions: squirt and spray into base of wound before packing Prim Dressing: VAC 3 x Per Week/30 Days ary Secondary Dressing: Zetuvit Plus Silicone Border Sacrum Dressing, Sm, 7x7 (in/in) 3 x Per Week/30 Days Discharge Instructions: Apply silicone border over primary dressing as directed. WOUND #3: - Calcaneus Wound Laterality: Right Topical: Betadine Solution 1 x Per Day/30 Days Discharge Instructions: paint right heel with betadine Secondary Dressing: ALLEVYN Heel 4 1/2in x 5 1/2in / 10.5cm x 13.5cm 1 x Per Day/30 Days Discharge Instructions: Apply over primary dressing as directed. Secondary Dressing: Woven Gauze Sponge, Non-Sterile 4x4 in 1 x Per Day/30 Days Discharge Instructions: Apply over primary dressing as directed. Secured With: [employer] 4.5x3.1 (in/yd) 1 x Per Day/30 Days Discharge Instructions: Secure with Kerlix as directed. Secured With: Paper T ape, 2x10 (in/yd) 1 x Per Day/30 Days Discharge Instructions: Secure dressing with tape as directed. 10/19/2022: The heel injury continues to improve. There is still some dry eschar present on the more plantar aspect of it. There is some slough accumulation on the surface of the stage IV pressure ulcer. I used a curette to debride  slough off of the surface of the pressure ulcer on her gluteus. We will use the last of her Keystone topical antibiotic and reapply the wound VAC today. Continue to paint the heel with Betadine and float the heel at all times when not ambulating; minimize ambulation is much as possible. I think the rest of the dry eschar will ultimately lift and peel off. Follow-up in 1 week. Electronic Signature(s) Signed: 10/19/2022 2:21:35 PM By: Fredirick Maudlin MD FACS Stef[place], Signed:E3/02/2023 2:21:35 PM By: Fredirick Maudlin MD Helmut Muster ([id number]) [id number] Page 8 of 10 Entered By: Fredirick Maudlin on 10/19/2022  14:21:35 -------------------------------------------------------------------------------- HxROS Details Patient Name: Date of Service: AVELINA, [name] 10/19/2022 12:30 PM Medical Record Number: [id number] Patient Account Number: [id number] Date of Birth/Sex: Treating RN: [birth year] (72 y.o. F) Primary Care Provider: Leretha Pol Other Clinician: Referring Provider: Treating Provider/Extender: Olin Pia in Treatment: 6 Information Obtained From Patient Caregiver Chart Constitutional Symptoms (General Health) Medical History: Past Medical History Notes: morbid obesity Eyes Medical History: Negative for: Cataracts; Glaucoma; Optic Neuritis Ear/Nose/Mouth/Throat Medical History: Positive for: Chronic sinus problems/congestion - chronic rhinitis Negative for: Middle ear problems Respiratory Medical History: Positive for: Sleep Apnea - uses CPAP Cardiovascular Medical History: Positive for: Congestive Heart Failure Past Medical History Notes: hyperlipidemia Gastrointestinal Medical History: Past Medical History Notes: GERD, IBS, eosinophilic esophagitis, diverticulosis, Endocrine Medical History: Negative for: Type I Diabetes; Type II Diabetes Genitourinary Medical History: Negative for: End Stage Renal Disease Past Medical History Notes: CKD stage 3 Immunological Medical History: Positive for: Raynauds Negative for: Lupus Erythematosus; Scleroderma Past Medical History Notes: Sjoegren syndrome, fibromyalgia Integumentary (Skin) Medical History: Negative for: History of Burn Musculoskeletal [name], [name] ([id number]) [id number] Page 9 of 10 Medical History: Positive for: Rheumatoid Arthritis Negative for: Gout; Osteoarthritis; Osteomyelitis Neurologic Medical History: Positive for: Neuropathy Negative for: Dementia; Quadriplegia; Paraplegia; Seizure Disorder Oncologic Medical History: Negative for:  Received Chemotherapy; Received Radiation Psychiatric Medical History: Negative for: Anorexia/bulimia; Confinement Anxiety HBO Extended History Items Ear/Nose/Mouth/Throat: Chronic sinus problems/congestion Immunizations Pneumococcal Vaccine: Received Pneumococcal Vaccination: Yes Received Pneumococcal Vaccination On or After 60th Birthday: Yes Implantable Devices None Hospitalization / Surgery History Type of Hospitalization/Surgery right femur fx ORIR right knee replacement left breast mass excision left ankle tibia repair abdominal hysterectomy adnoidectomy/tonsillectomy cholecystectomy dental implants patoid cystectomy tubal ligation Family and Social History Cancer: Yes - Father,Paternal Grandparents; Diabetes: No; Heart Disease: Yes - Mother,Father,Paternal Grandparents; Hereditary Spherocytosis: No; Hypertension: Yes - Mother; Kidney Disease: No; Lung Disease: No; Seizures: No; Stroke: Yes - Maternal Grandparents; Thyroid Problems: No; Tuberculosis: No; Never smoker; Marital Status - Widowed; Alcohol Use: Rarely; Drug Use: Prior History - TCH; Caffeine Use: Daily - soda, tea; Financial Concerns: No; Food, Clothing or Shelter Needs: No; Support System Lacking: No; Transportation Concerns: No Electronic Signature(s) Signed: 10/19/2022 4:22:40 PM By: Fredirick Maudlin MD FACS Entered By: Fredirick Maudlin on 10/19/2022 14:19:03 -------------------------------------------------------------------------------- SuperBill Details Patient Name: Date of Service: [name] 10/19/2022 Medical Record Number: [id number] Patient Account Number: [id number] Date of Birth/Sex: Treating RN: Sep 15, 1950 (72 y.o. 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) E66.9 Obesity, unspecified  Facility Procedures : CPT4 Code: [id number] Description: [phone] - DEBRIDE WOUND 1ST 20 SQ CM OR < ICD-10 Diagnosis Description L89.314 Pressure ulcer of right buttock, stage 4 Modifier: Quantity: 1 Physician Procedures : CPT4 Code Description Modifier [id number] - WC PHYS LEVEL 4 - EST PT 25 ICD-10 Diagnosis Description L89.314 Pressure ulcer of right buttock, stage 4 L89.610 Pressure ulcer of right heel, unstageable N18.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

## 2022-10-23 DIAGNOSIS — L89310 Pressure ulcer of right buttock, unstageable: Secondary | ICD-10-CM | POA: Diagnosis not present

## 2022-10-23 DIAGNOSIS — M9701XD Periprosthetic fracture around internal prosthetic right hip joint, subsequent encounter: Secondary | ICD-10-CM | POA: Diagnosis not present

## 2022-10-23 DIAGNOSIS — M3500 Sicca syndrome, unspecified: Secondary | ICD-10-CM | POA: Diagnosis not present

## 2022-10-23 DIAGNOSIS — J329 Chronic sinusitis, unspecified: Secondary | ICD-10-CM | POA: Diagnosis not present

## 2022-10-23 DIAGNOSIS — Z9181 History of falling: Secondary | ICD-10-CM | POA: Diagnosis not present

## 2022-10-23 DIAGNOSIS — Z7985 Long-term (current) use of injectable non-insulin antidiabetic drugs: Secondary | ICD-10-CM | POA: Diagnosis not present

## 2022-10-23 DIAGNOSIS — I5032 Chronic diastolic (congestive) heart failure: Secondary | ICD-10-CM | POA: Diagnosis not present

## 2022-10-23 DIAGNOSIS — K219 Gastro-esophageal reflux disease without esophagitis: Secondary | ICD-10-CM | POA: Diagnosis not present

## 2022-10-23 DIAGNOSIS — N1832 Chronic kidney disease, stage 3b: Secondary | ICD-10-CM | POA: Diagnosis not present

## 2022-10-23 DIAGNOSIS — G4733 Obstructive sleep apnea (adult) (pediatric): Secondary | ICD-10-CM | POA: Diagnosis not present

## 2022-10-23 DIAGNOSIS — G8929 Other chronic pain: Secondary | ICD-10-CM | POA: Diagnosis not present

## 2022-10-23 DIAGNOSIS — L89612 Pressure ulcer of right heel, stage 2: Secondary | ICD-10-CM | POA: Diagnosis not present

## 2022-10-23 DIAGNOSIS — M545 Low back pain, unspecified: Secondary | ICD-10-CM | POA: Diagnosis not present

## 2022-10-23 DIAGNOSIS — Z7982 Long term (current) use of aspirin: Secondary | ICD-10-CM | POA: Diagnosis not present

## 2022-10-23 DIAGNOSIS — I130 Hypertensive heart and chronic kidney disease with heart failure and stage 1 through stage 4 chronic kidney disease, or unspecified chronic kidney disease: Secondary | ICD-10-CM | POA: Diagnosis not present

## 2022-10-23 DIAGNOSIS — M797 Fibromyalgia: Secondary | ICD-10-CM | POA: Diagnosis not present

## 2022-10-23 DIAGNOSIS — M069 Rheumatoid arthritis, unspecified: Secondary | ICD-10-CM | POA: Diagnosis not present

## 2022-10-23 DIAGNOSIS — M80051D Age-related osteoporosis with current pathological fracture, right femur, subsequent encounter for fracture with routine healing: Secondary | ICD-10-CM | POA: Diagnosis not present

## 2022-10-23 DIAGNOSIS — E1122 Type 2 diabetes mellitus with diabetic chronic kidney disease: Secondary | ICD-10-CM | POA: Diagnosis not present

## 2022-10-25 ENCOUNTER — Encounter (HOSPITAL_BASED_OUTPATIENT_CLINIC_OR_DEPARTMENT_OTHER): Payer: PPO | Admitting: Internal Medicine

## 2022-10-25 ENCOUNTER — Other Ambulatory Visit: Payer: Self-pay | Admitting: *Deleted

## 2022-10-25 MED ORDER — PROMETHAZINE HCL 25 MG PO TABS: 25.0000 mg | ORAL_TABLET | Freq: Four times a day (QID) | ORAL | 0 refills | Status: DC | PRN

## 2022-10-25 NOTE — Telephone Encounter (Signed)
Patient contacted the office to request a refill on Promethazine and to see if we can renew her handicap placard. Patient states she is still currently wheelchair bound.   Next Visit: 11/28/2022  Last Visit: 07/25/2022  Last Fill: 09/12/2022  Dx: Rheumatoid arthritis involving multiple sites with positive rheumatoid factor   Current Dose per office note on 07/25/2022: not discussed  Okay to refill Promethazine?

## 2022-10-26 ENCOUNTER — Telehealth: Payer: Self-pay

## 2022-10-26 DIAGNOSIS — R269 Unspecified abnormalities of gait and mobility: Secondary | ICD-10-CM | POA: Diagnosis not present

## 2022-10-26 DIAGNOSIS — G4733 Obstructive sleep apnea (adult) (pediatric): Secondary | ICD-10-CM | POA: Diagnosis not present

## 2022-10-26 NOTE — Telephone Encounter (Signed)
Joelene Millin, Physical Therapist is requesting verbal orders for patient to get 1 week for extension of OT, Joelene Millin can be reached at [phone].

## 2022-10-26 NOTE — Telephone Encounter (Signed)
Called patient to discuss follow up with Dr. Moshe Cipro, left detailed message per DPR as pateint did not answer. Instructed patient to call Dr. Moshe Cipro for follow up regarding fluid volume overload, fluid restriction and diuretic adjustment.

## 2022-10-26 NOTE — Telephone Encounter (Signed)
Verbal given and [name] said we have received the paper to be signed and will place in your box for signature.

## 2022-10-26 NOTE — Telephone Encounter (Signed)
Can you call and give them the ok for these verbal orders? Please and thank you.

## 2022-10-26 NOTE — Telephone Encounter (Signed)
-----   Message from Sueanne Margarita, MD sent at 10/25/2022 10:21 PM EDT ----- I got records and reviewed them before I went out on medical leave and the issue was the patient was having significant fluid overload and using diuretics causing hypokalemia so the nephrologist said she needed to go on fluid restriction so she would not retain fluid and could come down on diuretics to help with low K+.  Patient misunderstood and thought she was to fluid restrict to help her potassium get getter.  Please have nephrology manage her renal issues,  I am out on medical leave for another 2 weeks and virtual for 2 weeks after that.   I would like her to get back in with Dr, Moshe Cipro to followup on this ----- Message ----- From: Joni Reining, RN Sent: 10/17/2022  10:50 AM EDT To: Sueanne Margarita, MD; Freada Bergeron, MD  Hello Dr. Johney Frame (and Radford Pax), Back in February Dr. Moshe Cipro (nephrologist) sent over a progress note from date of service 09/13/22. We responded and asked Dr. Moshe Cipro if the note she sent over from date of service 09/13/22 was the most recent visit she had with patient.  She just sent [place] back a message stating that 09/13/22 was the last time she saw the patient. Below are the original messages that prompted the request for more info about this patient's kidneys and nephro recommendations, let me know how I can help:  ----- Message from Sueanne Margarita, MD sent at 09/22/2022  5:47 PM EST ----- That does not make sense to fluid restrict for somebody with a low potassium.  Can you please get the last office note from that nephrologist ----- Message ----- From: Joni Reining, RN Sent: 09/22/2022   5:44 PM EST To: Sueanne Margarita, MD   FYI-patient w/ recent  K+  of 2.7 found by nephrologist, who recommends fluid restriction.   ----- Message ----- From: Corliss Parish, MD Sent: 10/16/2022  12:08 PM EST To: Joni Reining, RN  No havent seen her since this  ----- Message  ----- From: Joni Reining, RN Sent: 09/27/2022   5:40 PM EST To: Corliss Parish, MD  Hi Dr. Moshe Cipro,  Dr. Radford Pax is requesting more information. Has this patient seen you since this visit? If so, could you forward that note? If you can forward a previous visit as well we'd be grateful, Dr. Radford Pax may want to adjust medications.   ThanksDanae Chen RN [phone]

## 2022-10-27 ENCOUNTER — Encounter (HOSPITAL_BASED_OUTPATIENT_CLINIC_OR_DEPARTMENT_OTHER): Payer: PPO | Admitting: Internal Medicine

## 2022-10-30 DIAGNOSIS — J329 Chronic sinusitis, unspecified: Secondary | ICD-10-CM | POA: Diagnosis not present

## 2022-10-30 DIAGNOSIS — L89310 Pressure ulcer of right buttock, unstageable: Secondary | ICD-10-CM | POA: Diagnosis not present

## 2022-10-30 DIAGNOSIS — G4733 Obstructive sleep apnea (adult) (pediatric): Secondary | ICD-10-CM | POA: Diagnosis not present

## 2022-10-30 DIAGNOSIS — M3500 Sicca syndrome, unspecified: Secondary | ICD-10-CM | POA: Diagnosis not present

## 2022-10-30 DIAGNOSIS — Z7985 Long-term (current) use of injectable non-insulin antidiabetic drugs: Secondary | ICD-10-CM | POA: Diagnosis not present

## 2022-10-30 DIAGNOSIS — N1832 Chronic kidney disease, stage 3b: Secondary | ICD-10-CM | POA: Diagnosis not present

## 2022-10-30 DIAGNOSIS — I5032 Chronic diastolic (congestive) heart failure: Secondary | ICD-10-CM | POA: Diagnosis not present

## 2022-10-30 DIAGNOSIS — M80051D Age-related osteoporosis with current pathological fracture, right femur, subsequent encounter for fracture with routine healing: Secondary | ICD-10-CM | POA: Diagnosis not present

## 2022-10-30 DIAGNOSIS — L89612 Pressure ulcer of right heel, stage 2: Secondary | ICD-10-CM | POA: Diagnosis not present

## 2022-10-30 DIAGNOSIS — Z7982 Long term (current) use of aspirin: Secondary | ICD-10-CM | POA: Diagnosis not present

## 2022-10-30 DIAGNOSIS — K219 Gastro-esophageal reflux disease without esophagitis: Secondary | ICD-10-CM | POA: Diagnosis not present

## 2022-10-30 DIAGNOSIS — M069 Rheumatoid arthritis, unspecified: Secondary | ICD-10-CM | POA: Diagnosis not present

## 2022-10-30 DIAGNOSIS — Z9181 History of falling: Secondary | ICD-10-CM | POA: Diagnosis not present

## 2022-10-30 DIAGNOSIS — M545 Low back pain, unspecified: Secondary | ICD-10-CM | POA: Diagnosis not present

## 2022-10-30 DIAGNOSIS — E1122 Type 2 diabetes mellitus with diabetic chronic kidney disease: Secondary | ICD-10-CM | POA: Diagnosis not present

## 2022-10-30 DIAGNOSIS — M797 Fibromyalgia: Secondary | ICD-10-CM | POA: Diagnosis not present

## 2022-10-30 DIAGNOSIS — G8929 Other chronic pain: Secondary | ICD-10-CM | POA: Diagnosis not present

## 2022-10-30 DIAGNOSIS — I130 Hypertensive heart and chronic kidney disease with heart failure and stage 1 through stage 4 chronic kidney disease, or unspecified chronic kidney disease: Secondary | ICD-10-CM | POA: Diagnosis not present

## 2022-10-30 DIAGNOSIS — M9701XD Periprosthetic fracture around internal prosthetic right hip joint, subsequent encounter: Secondary | ICD-10-CM | POA: Diagnosis not present

## 2022-10-31 DIAGNOSIS — Z7985 Long-term (current) use of injectable non-insulin antidiabetic drugs: Secondary | ICD-10-CM | POA: Diagnosis not present

## 2022-10-31 DIAGNOSIS — M80051D Age-related osteoporosis with current pathological fracture, right femur, subsequent encounter for fracture with routine healing: Secondary | ICD-10-CM | POA: Diagnosis not present

## 2022-10-31 DIAGNOSIS — M797 Fibromyalgia: Secondary | ICD-10-CM | POA: Diagnosis not present

## 2022-10-31 DIAGNOSIS — I130 Hypertensive heart and chronic kidney disease with heart failure and stage 1 through stage 4 chronic kidney disease, or unspecified chronic kidney disease: Secondary | ICD-10-CM | POA: Diagnosis not present

## 2022-10-31 DIAGNOSIS — N1832 Chronic kidney disease, stage 3b: Secondary | ICD-10-CM | POA: Diagnosis not present

## 2022-10-31 DIAGNOSIS — Z9181 History of falling: Secondary | ICD-10-CM | POA: Diagnosis not present

## 2022-10-31 DIAGNOSIS — K219 Gastro-esophageal reflux disease without esophagitis: Secondary | ICD-10-CM | POA: Diagnosis not present

## 2022-10-31 DIAGNOSIS — Z7982 Long term (current) use of aspirin: Secondary | ICD-10-CM | POA: Diagnosis not present

## 2022-10-31 DIAGNOSIS — E1122 Type 2 diabetes mellitus with diabetic chronic kidney disease: Secondary | ICD-10-CM | POA: Diagnosis not present

## 2022-10-31 DIAGNOSIS — G4733 Obstructive sleep apnea (adult) (pediatric): Secondary | ICD-10-CM | POA: Diagnosis not present

## 2022-10-31 DIAGNOSIS — M9701XD Periprosthetic fracture around internal prosthetic right hip joint, subsequent encounter: Secondary | ICD-10-CM | POA: Diagnosis not present

## 2022-10-31 DIAGNOSIS — G8929 Other chronic pain: Secondary | ICD-10-CM | POA: Diagnosis not present

## 2022-10-31 DIAGNOSIS — L89310 Pressure ulcer of right buttock, unstageable: Secondary | ICD-10-CM | POA: Diagnosis not present

## 2022-10-31 DIAGNOSIS — M545 Low back pain, unspecified: Secondary | ICD-10-CM | POA: Diagnosis not present

## 2022-10-31 DIAGNOSIS — J329 Chronic sinusitis, unspecified: Secondary | ICD-10-CM | POA: Diagnosis not present

## 2022-10-31 DIAGNOSIS — M069 Rheumatoid arthritis, unspecified: Secondary | ICD-10-CM | POA: Diagnosis not present

## 2022-10-31 DIAGNOSIS — I5032 Chronic diastolic (congestive) heart failure: Secondary | ICD-10-CM | POA: Diagnosis not present

## 2022-10-31 DIAGNOSIS — L89612 Pressure ulcer of right heel, stage 2: Secondary | ICD-10-CM | POA: Diagnosis not present

## 2022-10-31 DIAGNOSIS — M3500 Sicca syndrome, unspecified: Secondary | ICD-10-CM | POA: Diagnosis not present

## 2022-11-01 ENCOUNTER — Encounter (HOSPITAL_BASED_OUTPATIENT_CLINIC_OR_DEPARTMENT_OTHER): Payer: PPO | Admitting: General Surgery

## 2022-11-01 DIAGNOSIS — R7302 Impaired glucose tolerance (oral): Secondary | ICD-10-CM | POA: Diagnosis not present

## 2022-11-01 DIAGNOSIS — N1832 Chronic kidney disease, stage 3b: Secondary | ICD-10-CM | POA: Diagnosis not present

## 2022-11-01 DIAGNOSIS — L89610 Pressure ulcer of right heel, unstageable: Secondary | ICD-10-CM | POA: Diagnosis not present

## 2022-11-01 DIAGNOSIS — I5032 Chronic diastolic (congestive) heart failure: Secondary | ICD-10-CM | POA: Diagnosis not present

## 2022-11-01 DIAGNOSIS — L89314 Pressure ulcer of right buttock, stage 4: Secondary | ICD-10-CM | POA: Diagnosis not present

## 2022-11-01 DIAGNOSIS — L89313 Pressure ulcer of right buttock, stage 3: Secondary | ICD-10-CM | POA: Diagnosis not present

## 2022-11-02 DIAGNOSIS — G4733 Obstructive sleep apnea (adult) (pediatric): Secondary | ICD-10-CM | POA: Diagnosis not present

## 2022-11-02 DIAGNOSIS — M80051D Age-related osteoporosis with current pathological fracture, right femur, subsequent encounter for fracture with routine healing: Secondary | ICD-10-CM | POA: Diagnosis not present

## 2022-11-02 DIAGNOSIS — M545 Low back pain, unspecified: Secondary | ICD-10-CM | POA: Diagnosis not present

## 2022-11-02 DIAGNOSIS — G8929 Other chronic pain: Secondary | ICD-10-CM | POA: Diagnosis not present

## 2022-11-02 DIAGNOSIS — M3500 Sicca syndrome, unspecified: Secondary | ICD-10-CM | POA: Diagnosis not present

## 2022-11-02 DIAGNOSIS — L89310 Pressure ulcer of right buttock, unstageable: Secondary | ICD-10-CM | POA: Diagnosis not present

## 2022-11-02 DIAGNOSIS — N1832 Chronic kidney disease, stage 3b: Secondary | ICD-10-CM | POA: Diagnosis not present

## 2022-11-02 DIAGNOSIS — M797 Fibromyalgia: Secondary | ICD-10-CM | POA: Diagnosis not present

## 2022-11-02 DIAGNOSIS — M9701XD Periprosthetic fracture around internal prosthetic right hip joint, subsequent encounter: Secondary | ICD-10-CM | POA: Diagnosis not present

## 2022-11-02 DIAGNOSIS — Z7985 Long-term (current) use of injectable non-insulin antidiabetic drugs: Secondary | ICD-10-CM | POA: Diagnosis not present

## 2022-11-02 DIAGNOSIS — Z9181 History of falling: Secondary | ICD-10-CM | POA: Diagnosis not present

## 2022-11-02 DIAGNOSIS — K219 Gastro-esophageal reflux disease without esophagitis: Secondary | ICD-10-CM | POA: Diagnosis not present

## 2022-11-02 DIAGNOSIS — I5032 Chronic diastolic (congestive) heart failure: Secondary | ICD-10-CM | POA: Diagnosis not present

## 2022-11-02 DIAGNOSIS — L89612 Pressure ulcer of right heel, stage 2: Secondary | ICD-10-CM | POA: Diagnosis not present

## 2022-11-02 DIAGNOSIS — Z7982 Long term (current) use of aspirin: Secondary | ICD-10-CM | POA: Diagnosis not present

## 2022-11-02 DIAGNOSIS — I130 Hypertensive heart and chronic kidney disease with heart failure and stage 1 through stage 4 chronic kidney disease, or unspecified chronic kidney disease: Secondary | ICD-10-CM | POA: Diagnosis not present

## 2022-11-02 DIAGNOSIS — M069 Rheumatoid arthritis, unspecified: Secondary | ICD-10-CM | POA: Diagnosis not present

## 2022-11-02 DIAGNOSIS — J329 Chronic sinusitis, unspecified: Secondary | ICD-10-CM | POA: Diagnosis not present

## 2022-11-02 DIAGNOSIS — E1122 Type 2 diabetes mellitus with diabetic chronic kidney disease: Secondary | ICD-10-CM | POA: Diagnosis not present

## 2022-11-02 NOTE — Progress Notes (Signed)
[name], [place] ([id number]) [id number] Page 1 of 9 Visit Report for 11/01/2022 Arrival Information Details Patient Name: Date of Service: ZSAZSA, [name] 11/01/2022 12:30 PM Medical Record Number: [id number] Patient Account Number: [id number] Date of Birth/Sex: Treating RN: 02-11-1951 (72 y.o. [name] Primary Care Henny Strauch: Leretha Pol Other Clinician: Referring Lonnell Chaput: Treating Dashayla Theissen/Extender: Olin Pia in Treatment: 7 Visit Information History Since Last Visit Added or deleted any medications: No Patient Arrived: Wheel Chair Any new allergies or adverse reactions: No Arrival Time: 12:43 Had a fall or experienced change in No Accompanied By: daughter activities of daily living that may affect Transfer Assistance: None risk of falls: Patient Identification Verified: Yes Signs or symptoms of abuse/neglect since last visito No Secondary Verification Process Completed: Yes Hospitalized since last visit: No Patient Requires Transmission-Based Precautions: No Implantable device outside of the clinic excluding No Patient Has Alerts: No cellular tissue based products placed in the center since last visit: Has Dressing in Place as Prescribed: Yes Pain Present Now: No Electronic Signature(s) Signed: 11/01/2022 5:57:17 PM By: Baruch Gouty RN, BSN Entered By: Baruch Gouty on 11/01/2022 12:44:36 -------------------------------------------------------------------------------- Encounter Discharge Information Details Patient Name: Date of Service: [name]. 11/01/2022 12:30 PM Medical Record Number: [id number] Patient Account Number: [id number] Date of Birth/Sex: Treating RN: [birth year] (72 y.o. [name] Primary Care Danille Oppedisano: Leretha Pol Other Clinician: Referring Calista Crain: Treating Ildefonso Keaney/Extender: Olin Pia in Treatment: 7 Encounter Discharge Information  Items Discharge Condition: Stable Ambulatory Status: Wheelchair Discharge Destination: Home Transportation: Private Auto Accompanied By: daughter Schedule Follow-up Appointment: Yes Clinical Summary of Care: Patient Declined Electronic Signature(s) Signed: 11/01/2022 5:57:17 PM By: Baruch Gouty RN, BSN Entered By: Baruch Gouty on 11/01/2022 16:50:06 -------------------------------------------------------------------------------- Lower Extremity Assessment Details Patient Name: Date of Service: [name], [name] 11/01/2022 12:30 PM Medical Record Number: [id number] Patient Account Number: [id number] Date of Birth/Sex: Treating RN: 05/14/1951 (72 y.o. [name] Primary Care Joby Richart: Leretha Pol Other Clinician: Referring Kandas Oliveto: Treating Dorthea Maina/Extender: Olin Pia in Treatment: 7 Edema Assessment Assessed: [Left: No] [Right: No] C[LeftJAPLEEN, CEJA E [id number] [[id number].pdf Page 2 of 9] Edema: [Left: Ye] [Right: s] Calf Left: Right: Point of Measurement: From Medial Instep 40 cm Ankle Left: Right: Point of Measurement: From Medial Instep 25.5 cm Vascular Assessment Pulses: Dorsalis Pedis Palpable: [Right:Yes] Electronic Signature(s) Signed: 11/01/2022 5:57:17 PM By: Baruch Gouty RN, BSN Entered By: Baruch Gouty on 11/01/2022 12:49:14 -------------------------------------------------------------------------------- Multi Wound Chart Details Patient Name: Date of Service: [name]. 11/01/2022 12:30 PM Medical Record Number: [id number] Patient Account Number: [id number] Date of Birth/Sex: Treating RN: [birth year] (72 y.o. F) Primary Care Alistar Mcenery: Leretha Pol Other Clinician: Referring Teola Felipe: Treating Zyion Leidner/Extender: Olin Pia in Treatment: 7 Vital Signs Height(in): 2 Pulse(bpm): 5 Weight(lbs): 207 Blood Pressure(mmHg): 124/57 Body  Mass Index(BMI): 37.9 Temperature(F): 97.8 Respiratory Rate(breaths/min): 18 [1:Photos:] [N/A:N/A] Right Gluteus Right Calcaneus N/A Wound Location: Pressure Injury Pressure Injury N/A Wounding Event: Pressure Ulcer Pressure Ulcer N/A Primary Etiology: Chronic sinus problems/congestion, Chronic sinus problems/congestion, N/A Comorbid History: Sleep Apnea, Congestive Heart Sleep Apnea, Congestive Heart Failure, Raynauds, Rheumatoid Failure, Raynauds, Rheumatoid Arthritis, Neuropathy Arthritis, Neuropathy 08/11/2022 09/21/2022 N/A Date Acquired: 7 4 N/A Weeks of Treatment: Open Open N/A Wound Status: No No N/A Wound Recurrence: 2x4.6x6.3 1.8x3.9x0.1 N/A Measurements L x W x D (cm) 7.226 5.513 N/A A (cm) : rea 45.522 0.551 N/A Volume (cm) : 67.10% 42.90% N/A % Reduction in A rea:  65.50% 43.00% N/A % Reduction in Volume: 12 Starting Position 1 (o'clock): 3 Ending Position 1 (o'clock): 9.1 Maximum Distance 1 (cm): Yes No N/A Undermining: Category/Stage IV Unstageable/Unclassified N/A Classification: Large Small N/A Exudate A mount: Serosanguineous Serosanguineous N/A Exudate Type: red, brown red, brown N/A Exudate Color: Well defined, not attached Flat and Intact N/A Wound MarginAKYLA, HAVERFIELD ([id number]) [id number] Page 3 of 9 Large (67-100%) Small (1-33%) N/A Granulation Amount: Pink, Pale Pink N/A Granulation Quality: Small (1-33%) Large (67-100%) N/A Necrotic Amount: Adherent Slough Eschar N/A Necrotic Tissue: Fat Layer (Subcutaneous Tissue): Yes Fat Layer (Subcutaneous Tissue): Yes N/A Exposed Structures: Fascia: No Fascia: No Tendon: No Tendon: No Muscle: No Muscle: No Joint: No Joint: No Bone: No Bone: No None Small (1-33%) N/A Epithelialization: Excoriation: No No Abnormalities Noted N/A Periwound Skin Texture: Induration: No Callus: No Crepitus: No Rash: No Scarring: No Maceration: No Dry/Scaly: Yes  N/A Periwound Skin Moisture: Dry/Scaly: No Maceration: No Atrophie Blanche: No No Abnormalities Noted N/A Periwound Skin Color: Cyanosis: No Ecchymosis: No Erythema: No Hemosiderin Staining: No Mottled: No Pallor: No Rubor: No No Abnormality No Abnormality N/A Temperature: Yes Yes N/A Tenderness on Palpation: Negative Pressure Wound Therapy N/A N/A Procedures Performed: Maintenance (NPWT) Treatment Notes Electronic Signature(s) Signed: 11/01/2022 1:50:33 PM By: Fredirick Maudlin MD FACS Entered By: Fredirick Maudlin on 11/01/2022 13:50:33 -------------------------------------------------------------------------------- Multi-Disciplinary Care Plan Details Patient Name: Date of Service: [name]. 11/01/2022 12:30 PM Medical Record Number: [id number] Patient Account Number: [id number] Date of Birth/Sex: Treating RN: February 23, 1951 (72 y.o. [name] Primary Care Nadeem Romanoski: Leretha Pol Other Clinician: Referring Lindalee Huizinga: Treating Naylin Burkle/Extender: Olin Pia in Treatment: 7 Multidisciplinary Care Plan reviewed with physician Active Inactive Pressure Nursing Diagnoses: Knowledge deficit related to management of pressures ulcers Goals: Patient will remain free of pressure ulcers Date Initiated: 09/15/2022 Date Inactivated: 09/28/2022 Target Resolution Date: 11/09/2022 Unmet Reason: new pressure ulcer Goal Status: Unmet right heel Patient/caregiver will verbalize risk factors for pressure ulcer development Date Initiated: 09/15/2022 Target Resolution Date: 01/12/2023 Goal Status: Active Interventions: Assess potential for pressure ulcer upon admission and as needed Treatment Activities: Patient referred for pressure reduction/relief devices : 09/15/2022 Pressure reduction/relief device ordered : 09/15/2022 Notes: Wound/Skin Impairment RAYONA, LOVEDAY ([id number]) [id number].pdf Page 4 of 9 Nursing  Diagnoses: Impaired tissue integrity Knowledge deficit related to ulceration/compromised skin integrity Goals: Patient/caregiver will verbalize understanding of skin care regimen Date Initiated: 09/28/2022 Target Resolution Date: 12/07/2022 Goal Status: Active Ulcer/skin breakdown will have a volume reduction of 50% by week 8 Date Initiated: 09/28/2022 Date Inactivated: 11/01/2022 Target Resolution Date: 11/02/2022 Goal Status: Met Ulcer/skin breakdown will have a volume reduction of 80% by week 12 Date Initiated: 11/01/2022 Target Resolution Date: 11/29/2022 Goal Status: Active Interventions: Assess patient/caregiver ability to obtain necessary supplies Assess patient/caregiver ability to perform ulcer/skin care regimen upon admission and as needed Assess ulceration(s) every visit Provide education on ulcer and skin care Treatment Activities: Skin care regimen initiated : 09/28/2022 Topical wound management initiated : 09/28/2022 Notes: Electronic Signature(s) Signed: 11/01/2022 5:57:17 PM By: Baruch Gouty RN, BSN Entered By: Baruch Gouty on 11/01/2022 13:04:25 -------------------------------------------------------------------------------- Negative Pressure Wound Therapy Maintenance (NPWT) Details Patient Name: Date of Service: GANIYAH, [name] 11/01/2022 12:30 PM Medical Record Number: [id number] Patient Account Number: [id number] Date of Birth/Sex: Treating RN: [birth year] (72 y.o. [name] Primary Care Melayah Skorupski: Leretha Pol Other Clinician: Referring Nada Godley: Treating Carmela Piechowski/Extender: Olin Pia in Treatment: 7 NPWT Maintenance Performed for: Wound #1 Right  Gluteus Additional Injuries Covered: No Performed By: Baruch Gouty, RN Type: VAC System Coverage Size (sq cm): 9.2 Pressure Type: Constant Pressure Setting: 125 mmHG Drain Type: None Primary Contact: None Sponge/Dressing Type: Foam- Black Date Initiated:  10/16/2022 Dressing Removed: Yes Quantity of Sponges/Gauze Removed: 1 Canister Changed: Yes Canister Exudate Volume: 150 Dressing Reapplied: Yes Quantity of Sponges/Gauze Inserted: 1 Respones T Treatment: o good Days On NPWT : 17 Post Procedure Diagnosis Same as Pre-procedure Electronic Signature(s) Signed: 11/01/2022 5:57:17 PM By: Baruch Gouty RN, BSN Entered By: Baruch Gouty on 11/01/2022 13:11:46 [name] ([id number]) [id number] Page 5 of 9 -------------------------------------------------------------------------------- Pain Assessment Details Patient Name: Date of Service: CAMBRIA, UMEDA 11/01/2022 12:30 PM Medical Record Number: [id number] Patient Account Number: [id number] Date of Birth/Sex: Treating RN: 17-Jul-1951 (72 y.o. [name] Primary Care Alwilda Gilland: Leretha Pol Other Clinician: Referring Shemekia Patane: Treating Theresia Pree/Extender: Olin Pia in Treatment: 7 Active Problems Location of Pain Severity and Description of Pain Patient Has Paino Yes Site Locations Pain Location: Pain in Ulcers With Dressing Change: Yes Duration of the Pain. Constant / Intermittento Intermittent Rate the pain. Current Pain Level: 0 Worst Pain Level: 5 Least Pain Level: 0 Character of Pain Describe the Pain: Aching, Other: sore Pain Management and Medication Current Pain Management: Medication: Yes Is the Current Pain Management Adequate: Adequate How does your wound impact your activities of daily livingo Sleep: No Bathing: No Appetite: No Relationship With Others: No Bladder Continence: No Emotions: No Bowel Continence: No Work: No Toileting: No Drive: No Dressing: No Hobbies: No Engineer, maintenance) Signed: 11/01/2022 5:57:17 PM By: Baruch Gouty RN, BSN Entered By: Baruch Gouty on 11/01/2022  12:45:49 -------------------------------------------------------------------------------- Patient/Caregiver Education Details Patient Name: Date of Service: [name] 3/20/2024andnbsp12:30 PM Medical Record Number: [id number] Patient Account Number: [id number] Date of Birth/Gender: Treating RN: [birth year] (72 y.o. [name] Primary Care Physician: Leretha Pol Other Clinician: Referring Physician: Treating Physician/Extender: Olin Pia in Treatment: 7 Education Assessment Education Provided To: Patient Education Topics Provided Pressure: Methods: Explain/Verbal Responses: Reinforcements needed, State content correctly SUNDY, MAIDONADO ([id number]) [phone].pdf Page 6 of 9 Electronic Signature(s) Signed: 11/01/2022 5:57:17 PM By: Baruch Gouty RN, BSN Entered By: Baruch Gouty on 11/01/2022 13:04:48 -------------------------------------------------------------------------------- Wound Assessment Details Patient Name: Date of Service: JOHNETTA, EGERER 11/01/2022 12:30 PM Medical Record Number: [id number] Patient Account Number: [id number] Date of Birth/Sex: Treating RN: [birth year] (72 y.o. 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 Wound Open Wounding Event: Pressure Injury Status: Date Acquired: 08/11/2022 Comorbid Chronic sinus problems/congestion, Sleep Apnea, Congestive Heart Weeks Of Treatment: 7 History: Failure, Raynauds, Rheumatoid Arthritis, Neuropathy Clustered Wound: No Photos Wound Measurements Length: (cm) 2 Width: (cm) 4.6 Depth: (cm) 6.3 Area: (cm) 7.226 Volume: (cm) 45.522 % Reduction in Area: 67.1% % Reduction in Volume: 65.5% Epithelialization:  None Tunneling: No Undermining: Yes Starting Position (o'clock): 12 Ending Position (o'clock): 3 Maximum Distance: (cm) 9.1 Wound Description Classification: Category/Stage IV Wound Margin: Well defined, not attached Exudate Amount: Large Exudate Type: Serosanguineous Exudate Color: red, brown Foul Odor After Cleansing: No Slough/Fibrino Yes Wound Bed Granulation Amount: Large (67-100%) Exposed Structure Granulation Quality: Pink, Pale Fascia Exposed: No Necrotic Amount: Small (1-33%) Fat Layer (Subcutaneous Tissue) Exposed: Yes Necrotic Quality: Adherent Slough Tendon Exposed: No Muscle Exposed: No Joint Exposed: No Bone Exposed:  No Periwound Skin Texture Texture Color No Abnormalities Noted: Yes No Abnormalities Noted: Yes Moisture Temperature / Pain No Abnormalities Noted: Yes Temperature: No Abnormality CATLYN, SHIPTON ([id number]) [id number].pdf Page 7 of 9 Tenderness on Palpation: Yes Treatment Notes Wound #1 (Gluteus) Wound Laterality: Right Cleanser Wound Cleanser Discharge Instruction: Cleanse the wound with wound cleanser prior to applying a clean dressing using gauze sponges, not tissue or cotton balls. Peri-Wound Care Topical Primary Dressing Promogran Prisma Matrix, 4.34 (sq in) (silver collagen) Discharge Instruction: Moisten collagen with saline or hydrogel and place at base of wound under VAC sponge VAC Secondary Dressing Zetuvit Plus Silicone Border Sacrum Dressing, Sm, 7x7 (in/in) Discharge Instruction: Apply silicone border over primary dressing as directed. Secured With Compression Wrap Compression Stockings Environmental education officer) Signed: 11/01/2022 5:57:17 PM By: Baruch Gouty RN, BSN Entered By: Baruch Gouty on 11/01/2022 13:01:22 -------------------------------------------------------------------------------- Wound Assessment Details Patient Name: Date of Service: [name], [name] 11/01/2022 12:30  PM Medical Record Number: [id number] Patient Account Number: [id number] Date of Birth/Sex: Treating RN: 08-19-1950 (72 y.o. [name] Primary Care Edinson Domeier: Leretha Pol Other Clinician: Referring Bryson Gavia: Treating Shacoya Burkhammer/Extender: Olin Pia in Treatment: 7 Wound Status Wound Number: 3 Primary Pressure Ulcer Etiology: Wound Location: Right Calcaneus Wound Open Wounding Event: Pressure Injury Status: Date Acquired: 09/21/2022 Comorbid Chronic sinus problems/congestion, Sleep Apnea, Congestive Heart Weeks Of Treatment: 4 History: Failure, Raynauds, Rheumatoid Arthritis, Neuropathy Clustered Wound: No Photos Wound Measurements Length: (cm) 1. Width: (cm) 3. Depth: (cm) 0. Area: (cm) 5 Volume: (cm) 0 [name], [name] ([id number]) Wound Description Classification: Unstageable/Unclassified Wound Margin: Flat and Intact Exudate Amount: Small Exudate Type: Serosanguineous Exudate Color: red, brown Foul Odor After Cleansing: Slough/Fibrino 8 % Reduction in Area: 42.9% 9 % Reduction in Volume: 43% 1 Epithelialization: Small (1-33%) .513 Tunneling: No .551 Undermining: No [id number] Page 8 of 9 No Yes Wound Bed Granulation Amount: Small (1-33%) Exposed Structure Granulation Quality: Pink Fascia Exposed: No Necrotic Amount: Large (67-100%) Fat Layer (Subcutaneous Tissue) Exposed: Yes Necrotic Quality: Eschar Tendon Exposed: No Muscle Exposed: No Joint Exposed: No Bone Exposed: No Periwound Skin Texture Texture Color No Abnormalities Noted: Yes No Abnormalities Noted: Yes Moisture Temperature / Pain No Abnormalities Noted: No Temperature: No Abnormality Dry / Scaly: Yes Tenderness on Palpation: Yes Maceration: No Treatment Notes Wound #3 (Calcaneus) Wound Laterality: Right Cleanser Peri-Wound Care Topical Betadine Solution Discharge Instruction: paint right heel eschar only with  betadine Primary Dressing Maxorb Extra Ag+ Alginate Dressing, 2x2 (in/in) Discharge Instruction: Apply to pink area of wound bed Secondary Dressing ALLEVYN Heel 4 1/2in x 5 1/2in / 10.5cm x 13.5cm Discharge Instruction: Apply over primary dressing as directed. Woven Gauze Sponge, Non-Sterile 4x4 in Discharge Instruction: Apply over primary dressing as directed. Secured With [employer], 4.5x3.1 (in/yd) Discharge Instruction: Secure with Kerlix as directed. Paper Tape, 2x10 (in/yd) Discharge Instruction: Secure dressing with tape as directed. Compression Wrap Compression Stockings Add-Ons Electronic Signature(s) Signed: 11/01/2022 5:57:17 PM By: Baruch Gouty RN, BSN Entered By: Baruch Gouty on 11/01/2022 12:51:29 -------------------------------------------------------------------------------- Vitals Details Patient Name: Date of Service: [name] 11/01/2022 12:30 PM Medical Record Number: [id number] Patient Account Number: [id number] Date of Birth/Sex: Treating RN: 01/31/1951 (72 y.o. [name] Primary Care Truitt Cruey: Leretha Pol Other Clinician: Referring Coletta Lockner: Treating Matsue Strom/Extender: Olin Pia in Treatment: [place], [place] ([id number]) [id number] Page 9 of 9 Vital Signs Time Taken: 12:44 Temperature (F): 97.8 Height (in): 62 Pulse (bpm): 76 Weight (lbs): 207  Respiratory Rate (breaths/min): 18 Body Mass Index (BMI): 37.9 Blood Pressure (mmHg): 124/57 Reference Range: 80 - 120 mg / dl Electronic Signature(s) Signed: 11/01/2022 5:57:17 PM By: Baruch Gouty RN, BSN Entered By: Baruch Gouty on 11/01/2022 12:45:03

## 2022-11-02 NOTE — Progress Notes (Signed)
ICOLE, PLACHTA ([id number]) [id number] Page 1 of 9 Visit Report for 11/01/2022 Chief Complaint Document Details Patient Name: Date of Service: [name], [name] 11/01/2022 12:30 PM Medical Record Number: [id number] Patient Account Number: [id number] Date of Birth/Sex: Treating RN: Dec 22, 1950 (72 y.o. F) Primary Care Provider: Leretha Pol Other Clinician: Referring Provider: Treating Provider/Extender: Olin Pia in Treatment: 7 Information Obtained from: Patient Chief Complaint Patient is at the clinic for treatment of open pressure ulcers Electronic Signature(s) Signed: 11/01/2022 1:50:40 PM By: Fredirick Maudlin MD FACS Entered By: Fredirick Maudlin on 11/01/2022 13:50:40 -------------------------------------------------------------------------------- HPI Details Patient Name: Date of Service: [name] 11/01/2022 12:30 PM Medical Record Number: [id number] Patient Account Number: [id number] Date of Birth/Sex: Treating RN: [birth year] (72 y.o. F) Primary Care Provider: Leretha Pol Other Clinician: Referring Provider: Treating Provider/Extender: Olin Pia in Treatment: 7 History of Present Illness HPI Description: ADMISSION 09/07/2023 This is a 72 year old woman with a past medical history notable for obesity, congestive heart failure, Raynaud's syndrome, CKD stage IIIb, osteoporosis, and rheumatoid arthritis. In November 2023, she suffered a fall that resulted in a femur fracture. She was hospitalized for about a week and underwent surgical repair of the fracture. She subsequently developed pressure ulcers on her right buttocks and ischium. She has been receiving home health services and they have been applying Medihoney. They have been reporting the wounds as stage II, but on evaluation, the large ulcer was probably unstageable at the time they were evaluating it but it is clearly a stage  IV at this point. The smaller ulcer does have fat layer exposure and therefore is a stage III. The patient is accompanied by her daughter. She says she has been sleeping on a regular bed but recently ordered an air mattress T opper, but does not have it yet. She is on Ozempic for weight loss and therefore has a poor appetite and struggles to get adequate protein intake. 09/15/2022: The large stage IV ulcer is substantially cleaner this week. The stage III ulcer is a little smaller. Both have slough accumulation. The culture that I took last week was polymicrobial. The Augmentin that I prescribed was adequate coverage for the species and she continues to take this. We have also ordered Keystone topical antibiotic compound, but this has not yet arrived. 09/21/2022: The stage IV ulcer has some necrotic muscle at the base but is otherwise fairly clean. The stage III ulcer is smaller again with light slough on the surface. She has her Keystone topical antibiotic compound with her today. 09/29/2022: There is still some necrotic muscle at the base of the stage IV ulcer that I was unable to get to last week. The stage III ulcer has healed. She unfortunately has developed a new pressure ulcer on her right heel. 10/06/2022: Still with some devitalized muscle at the base of the stage IV ulcer, but otherwise this wound is looking quite clean. The pressure induced tissue injury on her heel is demarcating and much of the area appears to be epithelialized, but there is still 2 areas that remain questionable. 10/12/2022: The stage IV ulcer is very clean and ready for wound VAC, which will be delivered tomorrow according to the patient. The tissue injury on her heel is drying up and does not feel particularly boggy this week. 10/19/2022: The heel injury continues to improve. There is still some dry eschar present on the more plantar aspect of it. There is some slough accumulation on the surface of  the stage IV pressure ulcer.  The wound VAC was initiated by home health last week. 11/01/2022: The gluteal pressure ulcer is very clean without any necrotic tissue or slough. The depth has come in by over half a centimeter. The deep tissue injury on her heel continues to improve. There is still dry eschar that we are painting with Betadine as well as some fresh-looking viable tissue at the margin. Electronic Signature(s) Signed: 11/01/2022 1:51:41 PM By: Fredirick Maudlin MD FACS Entered By: Fredirick Maudlin on 11/01/2022 13:51:40 [name] ([id number]) [id number] Page 2 of 9 -------------------------------------------------------------------------------- Physical Exam Details Patient Name: Date of Service: [name], [name] 11/01/2022 12:30 PM Medical Record Number: [id number] Patient Account Number: [id number] Date of Birth/Sex: Treating RN: July 20, 1951 (72 y.o. F) Primary Care Provider: Leretha Pol Other Clinician: Referring Provider: Treating Provider/Extender: Olin Pia in Treatment: 7 Constitutional . . . . no acute distress. Respiratory Normal work of breathing on room air. Notes 11/01/2022: The gluteal pressure ulcer is very clean without any necrotic tissue or slough. The depth has come in by over half a centimeter. The deep tissue injury on her heel continues to improve. There is still dry eschar that we are painting with Betadine as well as some fresh-looking viable tissue at the margin. Electronic Signature(s) Signed: 11/01/2022 1:52:50 PM By: Fredirick Maudlin MD FACS Entered By: Fredirick Maudlin on 11/01/2022 13:52:50 -------------------------------------------------------------------------------- Physician Orders Details Patient Name: Date of Service: [name], [name] 11/01/2022 12:30 PM Medical Record Number: [id number] Patient Account Number: [id number] Date of Birth/Sex: Treating RN: 06-16-1951 (72 y.o. [name] Primary Care  Provider: Leretha Pol Other Clinician: Referring Provider: Treating Provider/Extender: Olin Pia in Treatment: 7 Verbal / Phone Orders: No Diagnosis Coding ICD-10 Coding Code Description L89.314 Pressure ulcer of right buttock, stage 4 L89.610 Pressure ulcer of right heel, unstageable I73.00 Raynaud's syndrome without gangrene 99991111 Chronic diastolic (congestive) heart failure N18.32 Chronic kidney disease, stage 3b R73.02 Impaired glucose tolerance (oral) E66.9 Obesity, unspecified Follow-up Appointments ppointment in 2 weeks. - Dr. Celine Ahr RM 1 Return A Wednesday 4/3 @ 2:00 pm Anesthetic Wound #1 Right Gluteus (In clinic) Topical Lidocaine 4% applied to wound bed Wound #3 Right Calcaneus (In clinic) Topical Lidocaine 4% applied to wound bed Bathing/ Shower/ Hygiene May shower and wash wound with soap and water. Negative Presssure Wound Therapy Wound #1 Right Gluteus Wound Vac to wound continuously at 141mm/hg pressure Black Foam Off-Loading Low air-loss mattress (Group 2) - Adapt Turn and reposition every 2 hours - avoid lying on back, stand at least every hour while out of bed, float heels off bed with pillows under calves while in bed Prevalon Boot - right foot esp. while in bed. LORMA, PASSAFIUME ([id number]) [id number] Page 3 of 9 Additional Orders / Instructions Follow Nutritious Diet - increase protein to 70-80 gms per day, hold ozempic Juven Shake 1-2 times daily. Home Health No change in wound care orders this week; continue Home Health for wound care. May utilize formulary equivalent dressing for wound treatment orders unless otherwise specified. New wound care orders this week; continue Home Health for wound care. May utilize formulary equivalent dressing for wound treatment orders unless otherwise specified. Other Home Health Orders/Instructions: - [facility] Wound Treatment Wound #1 - Gluteus Wound  Laterality: Right Cleanser: Wound Cleanser 3 x Per Week/30 Days Discharge Instructions: Cleanse the wound with wound cleanser prior to applying a clean dressing using gauze sponges, not tissue or cotton balls. Prim Dressing:  Promogran Prisma Matrix, 4.34 (sq in) (silver collagen) 3 x Per Week/30 Days ary Discharge Instructions: Moisten collagen with saline or hydrogel and place at base of wound under VAC sponge Prim Dressing: VAC ary 3 x Per Week/30 Days Secondary Dressing: Zetuvit Plus Silicone Border Sacrum Dressing, Sm, 7x7 (in/in) 3 x Per Week/30 Days Discharge Instructions: Apply silicone border over primary dressing as directed. Wound #3 - Calcaneus Wound Laterality: Right Topical: Betadine Solution 1 x Per Day/30 Days Discharge Instructions: paint right heel eschar only with betadine Prim Dressing: Maxorb Extra Ag+ Alginate Dressing, 2x2 (in/in) 1 x Per Day/30 Days ary Discharge Instructions: Apply to pink area of wound bed Secondary Dressing: ALLEVYN Heel 4 1/2in x 5 1/2in / 10.5cm x 13.5cm 1 x Per Day/30 Days Discharge Instructions: Apply over primary dressing as directed. Secondary Dressing: Woven Gauze Sponge, Non-Sterile 4x4 in 1 x Per Day/30 Days Discharge Instructions: Apply over primary dressing as directed. Secured With: [employer], 4.5x3.1 (in/yd) 1 x Per Day/30 Days Discharge Instructions: Secure with Kerlix as directed. Secured With: Paper Tape, 2x10 (in/yd) 1 x Per Day/30 Days Discharge Instructions: Secure dressing with tape as directed. Electronic Signature(s) Signed: 11/01/2022 3:48:41 PM By: Fredirick Maudlin MD FACS Entered By: Fredirick Maudlin on 11/01/2022 13:53:02 -------------------------------------------------------------------------------- Problem List Details Patient Name: Date of Service: [name], [name] 11/01/2022 12:30 PM Medical Record Number: [id number] Patient Account Number: [id number] Date of Birth/Sex: Treating RN: April 03, 1951 (72 y.o.  [name] Primary Care Provider: Leretha Pol Other Clinician: Referring Provider: Treating Provider/Extender: Olin Pia in Treatment: 7 Active Problems ICD-10 Encounter Code Description Active Date MDM Diagnosis L89.314 Pressure ulcer of right buttock, stage 4 09/07/2022 No Yes L89.610 Pressure ulcer of right heel, unstageable 09/28/2022 No Yes I73.00 Raynaud's syndrome without gangrene 09/07/2022 No Yes WESTYN, WINDHOLZ ([id number]) [id number] Page 4 of 9 [id number] Chronic diastolic (congestive) heart failure 09/07/2022 No Yes N18.32 Chronic kidney disease, stage 3b 09/07/2022 No Yes R73.02 Impaired glucose tolerance (oral) 09/07/2022 No Yes E66.9 Obesity, unspecified 09/07/2022 No Yes Inactive Problems ICD-10 Code Description Active Date Inactive Date L89.313 Pressure ulcer of right buttock, stage 3 09/07/2022 09/07/2022 Resolved Problems Electronic Signature(s) Signed: 11/01/2022 1:50:25 PM By: Fredirick Maudlin MD FACS Entered By: Fredirick Maudlin on 11/01/2022 13:50:25 -------------------------------------------------------------------------------- Progress Note Details Patient Name: Date of Service: [name] 11/01/2022 12:30 PM Medical Record Number: [id number] Patient Account Number: [id number] Date of Birth/Sex: Treating RN: 12/14/1950 (72 y.o. F) Primary Care Provider: Leretha Pol Other Clinician: Referring Provider: Treating Provider/Extender: Olin Pia in Treatment: 7 Subjective Chief Complaint Information obtained from Patient Patient is at the clinic for treatment of open pressure ulcers History of Present Illness (HPI) ADMISSION 09/07/2023 This is a 72 year old woman with a past medical history notable for obesity, congestive heart failure, Raynaud's syndrome, CKD stage IIIb, osteoporosis, and rheumatoid arthritis. In November 2023, she suffered a fall that resulted  in a femur fracture. She was hospitalized for about a week and underwent surgical repair of the fracture. She subsequently developed pressure ulcers on her right buttocks and ischium. She has been receiving home health services and they have been applying Medihoney. They have been reporting the wounds as stage II, but on evaluation, the large ulcer was probably unstageable at the time they were evaluating it but it is clearly a stage IV at this point. The smaller ulcer does have fat layer exposure and therefore is a stage III. The patient is accompanied by her  daughter. She says she has been sleeping on a regular bed but recently ordered an air mattress T opper, but does not have it yet. She is on Ozempic for weight loss and therefore has a poor appetite and struggles to get adequate protein intake. 09/15/2022: The large stage IV ulcer is substantially cleaner this week. The stage III ulcer is a little smaller. Both have slough accumulation. The culture that I took last week was polymicrobial. The Augmentin that I prescribed was adequate coverage for the species and she continues to take this. We have also ordered Keystone topical antibiotic compound, but this has not yet arrived. 09/21/2022: The stage IV ulcer has some necrotic muscle at the base but is otherwise fairly clean. The stage III ulcer is smaller again with light slough on the surface. She has her Keystone topical antibiotic compound with her today. 09/29/2022: There is still some necrotic muscle at the base of the stage IV ulcer that I was unable to get to last week. The stage III ulcer has healed. She unfortunately has developed a new pressure ulcer on her right heel. 10/06/2022: Still with some devitalized muscle at the base of the stage IV ulcer, but otherwise this wound is looking quite clean. The pressure induced tissue injury on her heel is demarcating and much of the area appears to be epithelialized, but there is still 2 areas that  remain questionable. 10/12/2022: The stage IV ulcer is very clean and ready for wound VAC, which will be delivered tomorrow according to the patient. The tissue injury on her heel is drying up and does not feel particularly boggy this week. NOORAH, GIAMMONA ([id number]) [id number] Page 5 of 9 10/19/2022: The heel injury continues to improve. There is still some dry eschar present on the more plantar aspect of it. There is some slough accumulation on the surface of the stage IV pressure ulcer. The wound VAC was initiated by home health last week. 11/01/2022: The gluteal pressure ulcer is very clean without any necrotic tissue or slough. The depth has come in by over half a centimeter. The deep tissue injury on her heel continues to improve. There is still dry eschar that we are painting with Betadine as well as some fresh-looking viable tissue at the margin. Patient History Information obtained from Patient, Caregiver, Chart. Family History Cancer - Father,Paternal Grandparents, Heart Disease - Mother,Father,Paternal Grandparents, Hypertension - Mother, Stroke - Maternal Grandparents, No family history of Diabetes, Hereditary Spherocytosis, Kidney Disease, Lung Disease, Seizures, Thyroid Problems, Tuberculosis. 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 Medical History Eyes Denies history of Cataracts, Glaucoma, Optic Neuritis Ear/Nose/Mouth/Throat Patient has history of Chronic sinus problems/congestion - chronic rhinitis Denies history of Middle ear problems Respiratory Patient has history of Sleep Apnea - uses CPAP Cardiovascular Patient has history of Congestive Heart Failure Endocrine Denies history of Type I Diabetes, Type II Diabetes Genitourinary Denies history of End Stage Renal Disease Immunological Patient has history of Raynaudoos Denies history of Lupus  Erythematosus, Scleroderma Integumentary (Skin) Denies history of History of Burn Musculoskeletal Patient has history of Rheumatoid Arthritis Denies history of Gout, Osteoarthritis, Osteomyelitis Neurologic Patient has history of Neuropathy Denies history of Dementia, Quadriplegia, Paraplegia, Seizure Disorder Oncologic Denies history of Received Chemotherapy, Received Radiation Psychiatric Denies history of Anorexia/bulimia, Confinement Anxiety Hospitalization/Surgery History - right femur fx ORIR. - right knee replacement. - left breast mass excision. - left  ankle tibia repair. - abdominal hysterectomy. - adnoidectomy/tonsillectomy. - cholecystectomy. - dental implants. - patoid cystectomy. - tubal ligation. Medical A Surgical History Notes nd Constitutional Symptoms (General Health) morbid obesity Cardiovascular hyperlipidemia Gastrointestinal GERD, IBS, eosinophilic esophagitis, diverticulosis, Genitourinary CKD stage 3 Immunological Sjoegren syndrome, fibromyalgia Objective Constitutional no acute distress. Vitals Time Taken: 12:44 PM, Height: 62 in, Weight: 207 lbs, BMI: 37.9, Temperature: 97.8 F, Pulse: 76 bpm, Respiratory Rate: 18 breaths/min, Blood Pressure: 124/57 mmHg. Respiratory Normal work of breathing on room air. General Notes: 11/01/2022: The gluteal pressure ulcer is very clean without any necrotic tissue or slough. The depth has come in by over half a centimeter. The deep tissue injury on her heel continues to improve. There is still dry eschar that we are painting with Betadine as well as some fresh-looking viable tissue at the margin. Integumentary (Hair, Skin) Wound #1 status is Open. Original cause of wound was Pressure Injury. The date acquired was: 08/11/2022. The wound has been in treatment 7 weeks. The KLEIGH, HAIN ([id number]) [id number] Page 6 of 9 wound is located on the Right Gluteus. The wound measures 2cm length x  4.6cm width x 6.3cm depth; 7.226cm^2 area and 45.522cm^3 volume. There is Fat Layer (Subcutaneous Tissue) exposed. There is no tunneling noted, however, there is undermining starting at 12:00 and ending at 3:00 with a maximum distance of 9.1cm. There is a large amount of serosanguineous drainage noted. The wound margin is well defined and not attached to the wound base. There is large (67-100%) pink, pale granulation within the wound bed. There is a small (1-33%) amount of necrotic tissue within the wound bed including Adherent Slough. The periwound skin appearance had no abnormalities noted for texture. The periwound skin appearance had no abnormalities noted for moisture. The periwound skin appearance had no abnormalities noted for color. Periwound temperature was noted as No Abnormality. The periwound has tenderness on palpation. Wound #3 status is Open. Original cause of wound was Pressure Injury. The date acquired was: 09/21/2022. The wound has been in treatment 4 weeks. The wound is located on the Right Calcaneus. The wound measures 1.8cm length x 3.9cm width x 0.1cm depth; 5.513cm^2 area and 0.551cm^3 volume. There is Fat Layer (Subcutaneous Tissue) exposed. There is no tunneling or undermining noted. There is a small amount of serosanguineous drainage noted. The wound margin is flat and intact. There is small (1-33%) pink granulation within the wound bed. There is a large (67-100%) amount of necrotic tissue within the wound bed including Eschar. The periwound skin appearance had no abnormalities noted for texture. The periwound skin appearance had no abnormalities noted for color. The periwound skin appearance exhibited: Dry/Scaly. The periwound skin appearance did not exhibit: Maceration. Periwound temperature was noted as No Abnormality. The periwound has tenderness on palpation. Assessment Active Problems ICD-10 Pressure ulcer of right buttock, stage 4 Pressure ulcer of right heel,  unstageable Raynaud's syndrome without gangrene Chronic diastolic (congestive) heart failure Chronic kidney disease, stage 3b Impaired glucose tolerance (oral) Obesity, unspecified Plan Follow-up Appointments: Return Appointment in 2 weeks. - Dr. Celine Ahr RM 1 Wednesday 4/3 @ 2:00 pm Anesthetic: Wound #1 Right Gluteus: (In clinic) Topical Lidocaine 4% applied to wound bed Wound #3 Right Calcaneus: (In clinic) Topical Lidocaine 4% applied to wound bed Bathing/ Shower/ Hygiene: May shower and wash wound with soap and water. Negative Presssure Wound Therapy: Wound #1 Right Gluteus: Wound Vac to wound continuously at 152mm/hg pressure Black Foam Off-Loading: Low air-loss mattress (Group 2) - Adapt Turn  and reposition every 2 hours - avoid lying on back, stand at least every hour while out of bed, float heels off bed with pillows under calves while in bed Prevalon Boot - right foot esp. while in bed. Additional Orders / Instructions: Follow Nutritious Diet - increase protein to 70-80 gms per day, hold ozempic Juven Shake 1-2 times daily. Home Health: No change in wound care orders this week; continue Home Health for wound care. May utilize formulary equivalent dressing for wound treatment orders unless otherwise specified. New wound care orders this week; continue Home Health for wound care. May utilize formulary equivalent dressing for wound treatment orders unless otherwise specified. Other Home Health Orders/Instructions: - [facility] WOUND #1: - Gluteus Wound Laterality: Right Cleanser: Wound Cleanser 3 x Per Week/30 Days Discharge Instructions: Cleanse the wound with wound cleanser prior to applying a clean dressing using gauze sponges, not tissue or cotton balls. Prim Dressing: Promogran Prisma Matrix, 4.34 (sq in) (silver collagen) 3 x Per Week/30 Days ary Discharge Instructions: Moisten collagen with saline or hydrogel and place at base of wound under VAC sponge Prim Dressing:  VAC 3 x Per Week/30 Days ary Secondary Dressing: Zetuvit Plus Silicone Border Sacrum Dressing, Sm, 7x7 (in/in) 3 x Per Week/30 Days Discharge Instructions: Apply silicone border over primary dressing as directed. WOUND #3: - Calcaneus Wound Laterality: Right Topical: Betadine Solution 1 x Per Day/30 Days Discharge Instructions: paint right heel eschar only with betadine Prim Dressing: Maxorb Extra Ag+ Alginate Dressing, 2x2 (in/in) 1 x Per Day/30 Days ary Discharge Instructions: Apply to pink area of wound bed Secondary Dressing: ALLEVYN Heel 4 1/2in x 5 1/2in / 10.5cm x 13.5cm 1 x Per Day/30 Days Discharge Instructions: Apply over primary dressing as directed. Secondary Dressing: Woven Gauze Sponge, Non-Sterile 4x4 in 1 x Per Day/30 Days Discharge Instructions: Apply over primary dressing as directed. Secured With: [employer], 4.5x3.1 (in/yd) 1 x Per Day/30 Days Discharge Instructions: Secure with Kerlix as directed. Secured With: Paper T ape, 2x10 (in/yd) 1 x Per Day/30 Days Discharge Instructions: Secure dressing with tape as directed. 11/01/2022: The gluteal pressure ulcer is very clean without any necrotic tissue or slough. The depth has come in by over half a centimeter. The deep tissue LISBETH, CERTO ([id number]) [id number] Page 7 of 9 injury on her heel continues to improve. There is still dry eschar that we are painting with Betadine as well as some fresh-looking viable tissue at the margin. No debridement was necessary from either site. We will apply Prisma silver collagen to the open area on her heel and continue to paint the dry eschar with Betadine. Continue wound VAC to the gluteal pressure ulcer but we are going to put some collagen at the base of the wound here, as well. She will follow-up in 2 weeks. Electronic Signature(s) Signed: 11/01/2022 1:53:45 PM By: Fredirick Maudlin MD FACS Entered By: Fredirick Maudlin on 11/01/2022  13:53:45 -------------------------------------------------------------------------------- HxROS Details Patient Name: Date of Service: CA LEEAH, [name] 11/01/2022 12:30 PM Medical Record Number: [id number] Patient Account Number: [id number] Date of Birth/Sex: Treating RN: 09-Apr-1951 (72 y.o. F) Primary Care Provider: Leretha Pol Other Clinician: Referring Provider: Treating Provider/Extender: Olin Pia in Treatment: 7 Information Obtained From Patient Caregiver Chart Constitutional Symptoms (General Health) Medical History: Past Medical History Notes: morbid obesity Eyes Medical History: Negative for: Cataracts; Glaucoma; Optic Neuritis Ear/Nose/Mouth/Throat Medical History: Positive for: Chronic sinus problems/congestion - chronic rhinitis Negative for: Middle ear problems Respiratory Medical History: Positive  for: Sleep Apnea - uses CPAP Cardiovascular Medical History: Positive for: Congestive Heart Failure Past Medical History Notes: hyperlipidemia Gastrointestinal Medical History: Past Medical History Notes: GERD, IBS, eosinophilic esophagitis, diverticulosis, Endocrine Medical History: Negative for: Type I Diabetes; Type II Diabetes Genitourinary Medical History: Negative for: End Stage Renal Disease Past Medical History Notes: CKD stage 3 Immunological Medical History: Positive forDEBBERA, [name] ([id number]) [id number] Page 8 of 9 Negative for: Lupus Erythematosus; Scleroderma Past Medical History Notes: Sjoegren syndrome, fibromyalgia Integumentary (Skin) Medical History: Negative for: History of Burn Musculoskeletal Medical History: Positive for: Rheumatoid Arthritis Negative for: Gout; Osteoarthritis; Osteomyelitis Neurologic Medical History: Positive for: Neuropathy Negative for: Dementia; Quadriplegia; Paraplegia; Seizure Disorder Oncologic Medical History: Negative for:  Received Chemotherapy; Received Radiation Psychiatric Medical History: Negative for: Anorexia/bulimia; Confinement Anxiety HBO Extended History Items Ear/Nose/Mouth/Throat: Chronic sinus problems/congestion Immunizations Pneumococcal Vaccine: Received Pneumococcal Vaccination: Yes Received Pneumococcal Vaccination On or After 60th Birthday: Yes Implantable Devices None Hospitalization / Surgery History Type of Hospitalization/Surgery right femur fx ORIR right knee replacement left breast mass excision left ankle tibia repair abdominal hysterectomy adnoidectomy/tonsillectomy cholecystectomy dental implants patoid cystectomy tubal ligation Family and Social History Cancer: Yes - Father,Paternal Grandparents; Diabetes: No; Heart Disease: Yes - Mother,Father,Paternal Grandparents; Hereditary Spherocytosis: No; Hypertension: Yes - Mother; Kidney Disease: No; Lung Disease: No; Seizures: No; Stroke: Yes - Maternal Grandparents; Thyroid Problems: No; Tuberculosis: No; Never smoker; Marital Status - Widowed; Alcohol Use: Rarely; Drug Use: Prior History - TCH; Caffeine Use: Daily - soda, tea; Financial Concerns: No; Food, Clothing or Shelter Needs: No; Support System Lacking: No; Transportation Concerns: No Electronic Signature(s) Signed: 11/01/2022 3:48:41 PM By: Fredirick Maudlin MD FACS Entered By: Fredirick Maudlin on 11/01/2022 13:52:18 -------------------------------------------------------------------------------- SuperBill Details Patient Name: Date of Service: [name] 11/01/2022 Medical Record Number: [id number] Patient Account Number: [id number] Date of Birth/Sex: Treating RN: Dec 27, 1950 (72 y.o. F) Primary Care Provider: Leretha Pol Other Clinician: CHARYN, HAERING ([id number]) [id number] Page 9 of 9 Referring Provider: Treating Provider/Extender: Olin Pia in Treatment: 7 Diagnosis Coding ICD-10  Codes Code Description L89.314 Pressure ulcer of right buttock, stage 4 L89.610 Pressure ulcer of right heel, unstageable I73.00 Raynaud's syndrome without gangrene [id number] Chronic diastolic (congestive) heart failure N18.32 Chronic kidney disease, stage 3b R73.02 Impaired glucose tolerance (oral) E66.9 Obesity, unspecified Facility Procedures : CPT4 Code: [id number] Description: [id number] - WOUND VAC-50 SQ CM OR LESS Modifier: Quantity: 1 Physician Procedures : CPT4 Code Description Modifier [id number] - WC PHYS LEVEL 4 - EST PT ICD-10 Diagnosis Description L89.314 Pressure ulcer of right buttock, stage 4 L89.610 Pressure ulcer of right heel, unstageable [id number] Chronic diastolic (congestive) heart failure  N18.32 Chronic kidney disease, stage 3b Quantity: 1 Electronic Signature(s) Signed: 11/01/2022 1:54:01 PM By: Fredirick Maudlin MD FACS Entered By: Fredirick Maudlin on 11/01/2022 13:54:01

## 2022-11-03 DIAGNOSIS — E1122 Type 2 diabetes mellitus with diabetic chronic kidney disease: Secondary | ICD-10-CM | POA: Diagnosis not present

## 2022-11-03 DIAGNOSIS — K219 Gastro-esophageal reflux disease without esophagitis: Secondary | ICD-10-CM | POA: Diagnosis not present

## 2022-11-03 DIAGNOSIS — G8929 Other chronic pain: Secondary | ICD-10-CM | POA: Diagnosis not present

## 2022-11-03 DIAGNOSIS — I130 Hypertensive heart and chronic kidney disease with heart failure and stage 1 through stage 4 chronic kidney disease, or unspecified chronic kidney disease: Secondary | ICD-10-CM | POA: Diagnosis not present

## 2022-11-03 DIAGNOSIS — L89310 Pressure ulcer of right buttock, unstageable: Secondary | ICD-10-CM | POA: Diagnosis not present

## 2022-11-03 DIAGNOSIS — M9701XD Periprosthetic fracture around internal prosthetic right hip joint, subsequent encounter: Secondary | ICD-10-CM | POA: Diagnosis not present

## 2022-11-03 DIAGNOSIS — M069 Rheumatoid arthritis, unspecified: Secondary | ICD-10-CM | POA: Diagnosis not present

## 2022-11-03 DIAGNOSIS — N1832 Chronic kidney disease, stage 3b: Secondary | ICD-10-CM | POA: Diagnosis not present

## 2022-11-03 DIAGNOSIS — L89612 Pressure ulcer of right heel, stage 2: Secondary | ICD-10-CM | POA: Diagnosis not present

## 2022-11-03 DIAGNOSIS — J329 Chronic sinusitis, unspecified: Secondary | ICD-10-CM | POA: Diagnosis not present

## 2022-11-03 DIAGNOSIS — M3500 Sicca syndrome, unspecified: Secondary | ICD-10-CM | POA: Diagnosis not present

## 2022-11-03 DIAGNOSIS — M797 Fibromyalgia: Secondary | ICD-10-CM | POA: Diagnosis not present

## 2022-11-03 DIAGNOSIS — I5032 Chronic diastolic (congestive) heart failure: Secondary | ICD-10-CM | POA: Diagnosis not present

## 2022-11-03 DIAGNOSIS — Z9181 History of falling: Secondary | ICD-10-CM | POA: Diagnosis not present

## 2022-11-03 DIAGNOSIS — M80051D Age-related osteoporosis with current pathological fracture, right femur, subsequent encounter for fracture with routine healing: Secondary | ICD-10-CM | POA: Diagnosis not present

## 2022-11-03 DIAGNOSIS — G4733 Obstructive sleep apnea (adult) (pediatric): Secondary | ICD-10-CM | POA: Diagnosis not present

## 2022-11-03 DIAGNOSIS — M545 Low back pain, unspecified: Secondary | ICD-10-CM | POA: Diagnosis not present

## 2022-11-03 DIAGNOSIS — Z7982 Long term (current) use of aspirin: Secondary | ICD-10-CM | POA: Diagnosis not present

## 2022-11-03 DIAGNOSIS — Z7985 Long-term (current) use of injectable non-insulin antidiabetic drugs: Secondary | ICD-10-CM | POA: Diagnosis not present

## 2022-11-06 DIAGNOSIS — E1122 Type 2 diabetes mellitus with diabetic chronic kidney disease: Secondary | ICD-10-CM | POA: Diagnosis not present

## 2022-11-06 DIAGNOSIS — M3500 Sicca syndrome, unspecified: Secondary | ICD-10-CM | POA: Diagnosis not present

## 2022-11-06 DIAGNOSIS — N1832 Chronic kidney disease, stage 3b: Secondary | ICD-10-CM | POA: Diagnosis not present

## 2022-11-06 DIAGNOSIS — L89612 Pressure ulcer of right heel, stage 2: Secondary | ICD-10-CM | POA: Diagnosis not present

## 2022-11-06 DIAGNOSIS — M797 Fibromyalgia: Secondary | ICD-10-CM | POA: Diagnosis not present

## 2022-11-06 DIAGNOSIS — Z9181 History of falling: Secondary | ICD-10-CM | POA: Diagnosis not present

## 2022-11-06 DIAGNOSIS — I5032 Chronic diastolic (congestive) heart failure: Secondary | ICD-10-CM | POA: Diagnosis not present

## 2022-11-06 DIAGNOSIS — M9701XD Periprosthetic fracture around internal prosthetic right hip joint, subsequent encounter: Secondary | ICD-10-CM | POA: Diagnosis not present

## 2022-11-06 DIAGNOSIS — L89314 Pressure ulcer of right buttock, stage 4: Secondary | ICD-10-CM | POA: Diagnosis not present

## 2022-11-06 DIAGNOSIS — L89310 Pressure ulcer of right buttock, unstageable: Secondary | ICD-10-CM | POA: Diagnosis not present

## 2022-11-06 DIAGNOSIS — G4733 Obstructive sleep apnea (adult) (pediatric): Secondary | ICD-10-CM | POA: Diagnosis not present

## 2022-11-06 DIAGNOSIS — M069 Rheumatoid arthritis, unspecified: Secondary | ICD-10-CM | POA: Diagnosis not present

## 2022-11-06 DIAGNOSIS — I130 Hypertensive heart and chronic kidney disease with heart failure and stage 1 through stage 4 chronic kidney disease, or unspecified chronic kidney disease: Secondary | ICD-10-CM | POA: Diagnosis not present

## 2022-11-06 DIAGNOSIS — Z7982 Long term (current) use of aspirin: Secondary | ICD-10-CM | POA: Diagnosis not present

## 2022-11-06 DIAGNOSIS — M545 Low back pain, unspecified: Secondary | ICD-10-CM | POA: Diagnosis not present

## 2022-11-06 DIAGNOSIS — J329 Chronic sinusitis, unspecified: Secondary | ICD-10-CM | POA: Diagnosis not present

## 2022-11-06 DIAGNOSIS — M80051D Age-related osteoporosis with current pathological fracture, right femur, subsequent encounter for fracture with routine healing: Secondary | ICD-10-CM | POA: Diagnosis not present

## 2022-11-06 DIAGNOSIS — K219 Gastro-esophageal reflux disease without esophagitis: Secondary | ICD-10-CM | POA: Diagnosis not present

## 2022-11-06 DIAGNOSIS — G8929 Other chronic pain: Secondary | ICD-10-CM | POA: Diagnosis not present

## 2022-11-06 DIAGNOSIS — Z7985 Long-term (current) use of injectable non-insulin antidiabetic drugs: Secondary | ICD-10-CM | POA: Diagnosis not present

## 2022-11-08 ENCOUNTER — Ambulatory Visit (HOSPITAL_BASED_OUTPATIENT_CLINIC_OR_DEPARTMENT_OTHER): Payer: PPO | Admitting: General Surgery

## 2022-11-10 ENCOUNTER — Telehealth: Payer: Self-pay

## 2022-11-10 DIAGNOSIS — G8929 Other chronic pain: Secondary | ICD-10-CM | POA: Diagnosis not present

## 2022-11-10 DIAGNOSIS — N1832 Chronic kidney disease, stage 3b: Secondary | ICD-10-CM | POA: Diagnosis not present

## 2022-11-10 DIAGNOSIS — K219 Gastro-esophageal reflux disease without esophagitis: Secondary | ICD-10-CM | POA: Diagnosis not present

## 2022-11-10 DIAGNOSIS — J329 Chronic sinusitis, unspecified: Secondary | ICD-10-CM | POA: Diagnosis not present

## 2022-11-10 DIAGNOSIS — Z7982 Long term (current) use of aspirin: Secondary | ICD-10-CM | POA: Diagnosis not present

## 2022-11-10 DIAGNOSIS — I5032 Chronic diastolic (congestive) heart failure: Secondary | ICD-10-CM | POA: Diagnosis not present

## 2022-11-10 DIAGNOSIS — G4733 Obstructive sleep apnea (adult) (pediatric): Secondary | ICD-10-CM | POA: Diagnosis not present

## 2022-11-10 DIAGNOSIS — E1122 Type 2 diabetes mellitus with diabetic chronic kidney disease: Secondary | ICD-10-CM | POA: Diagnosis not present

## 2022-11-10 DIAGNOSIS — M797 Fibromyalgia: Secondary | ICD-10-CM | POA: Diagnosis not present

## 2022-11-10 DIAGNOSIS — M80051D Age-related osteoporosis with current pathological fracture, right femur, subsequent encounter for fracture with routine healing: Secondary | ICD-10-CM | POA: Diagnosis not present

## 2022-11-10 DIAGNOSIS — L89310 Pressure ulcer of right buttock, unstageable: Secondary | ICD-10-CM | POA: Diagnosis not present

## 2022-11-10 DIAGNOSIS — M545 Low back pain, unspecified: Secondary | ICD-10-CM | POA: Diagnosis not present

## 2022-11-10 DIAGNOSIS — L89612 Pressure ulcer of right heel, stage 2: Secondary | ICD-10-CM | POA: Diagnosis not present

## 2022-11-10 DIAGNOSIS — I130 Hypertensive heart and chronic kidney disease with heart failure and stage 1 through stage 4 chronic kidney disease, or unspecified chronic kidney disease: Secondary | ICD-10-CM | POA: Diagnosis not present

## 2022-11-10 DIAGNOSIS — Z9181 History of falling: Secondary | ICD-10-CM | POA: Diagnosis not present

## 2022-11-10 DIAGNOSIS — Z7985 Long-term (current) use of injectable non-insulin antidiabetic drugs: Secondary | ICD-10-CM | POA: Diagnosis not present

## 2022-11-10 DIAGNOSIS — M069 Rheumatoid arthritis, unspecified: Secondary | ICD-10-CM | POA: Diagnosis not present

## 2022-11-10 DIAGNOSIS — M3500 Sicca syndrome, unspecified: Secondary | ICD-10-CM | POA: Diagnosis not present

## 2022-11-10 DIAGNOSIS — M9701XD Periprosthetic fracture around internal prosthetic right hip joint, subsequent encounter: Secondary | ICD-10-CM | POA: Diagnosis not present

## 2022-11-10 NOTE — Telephone Encounter (Signed)
Called patient to discuss labs sent over from Dr. Moshe Cipro in late January. Patient states she has not been on a fluid restriction. She states her fluid retention has been much better. She also states to her knowledge, her Cr was improved at 1.28 on her last labwork and her K+ was "normal" at 4.2. She states she does not have any current follow up with Dr. Moshe Cipro as she only needs routine follow up and Dr. Shelva Majestic books are not open "that far out right now". Patient states she has no  concerns about her heart or kidneys right now, her main concern is that she is receiving treatment for pressure ulcers on her heel and back and is being treated with wound vac therapy.  Forwarded to Dr. Radford Pax.

## 2022-11-10 NOTE — Telephone Encounter (Signed)
-----   Message from Traci R Turner, MD sent at 10/25/2022 10:21 PM EDT ----- I got records and reviewed them before I went out on medical leave and the issue was the patient was having significant fluid overload and using diuretics causing hypokalemia so the nephrologist said she needed to go on fluid restriction so she would not retain fluid and could come down on diuretics to help with low K+.  Patient misunderstood and thought she was to fluid restrict to help her potassium get getter.  Please have nephrology manage her renal issues,  I am out on medical leave for another 2 weeks and virtual for 2 weeks after that.   I would like her to get back in with Dr, Goldsborough to followup on this ----- Message ----- From: Ragena Fiola L, RN Sent: 10/17/2022  10:50 AM EDT To: Traci R Turner, MD; Heather E Pemberton, MD  Hello Dr. Pemberton (and Turner), Back in February Dr. Goldsborough (nephrologist) sent over a progress note from date of service 09/13/22. We responded and asked Dr. Goldsborough if the note she sent over from date of service 09/13/22 was the most recent visit she had with patient.  She just sent us back a message stating that 09/13/22 was the last time she saw the patient. Below are the original messages that prompted the request for more info about this patient's kidneys and nephro recommendations, let me know how I can help:  ----- Message from Traci R Turner, MD sent at 09/22/2022  5:47 PM EST ----- That does not make sense to fluid restrict for somebody with a low potassium.  Can you please get the last office note from that nephrologist ----- Message ----- From: Kevontae Burgoon L, RN Sent: 09/22/2022   5:44 PM EST To: Traci R Turner, MD   FYI-patient w/ recent  K+  of 2.7 found by nephrologist, who recommends fluid restriction.   ----- Message ----- From: Goldsborough, Kellie, MD Sent: 10/16/2022  12:08 PM EST To: Reganne Messerschmidt L Kegan Mckeithan, RN  No havent seen her since this  ----- Message  ----- From: Asahel Risden L, RN Sent: 09/27/2022   5:40 PM EST To: Kellie Goldsborough, MD  Hi Dr. Goldsborough,  Dr. Turner is requesting more information. Has this patient seen you since this visit? If so, could you forward that note? If you can forward a previous visit as well we'd be grateful, Dr. Turner may want to adjust medications.   Thanks,  Salam Micucci RN [phone]    

## 2022-11-11 DIAGNOSIS — M6281 Muscle weakness (generalized): Secondary | ICD-10-CM | POA: Diagnosis not present

## 2022-11-11 DIAGNOSIS — S72401D Unspecified fracture of lower end of right femur, subsequent encounter for closed fracture with routine healing: Secondary | ICD-10-CM | POA: Diagnosis not present

## 2022-11-11 DIAGNOSIS — R2689 Other abnormalities of gait and mobility: Secondary | ICD-10-CM | POA: Diagnosis not present

## 2022-11-11 DIAGNOSIS — I5022 Chronic systolic (congestive) heart failure: Secondary | ICD-10-CM | POA: Diagnosis not present

## 2022-11-12 DIAGNOSIS — L89313 Pressure ulcer of right buttock, stage 3: Secondary | ICD-10-CM | POA: Diagnosis not present

## 2022-11-12 DIAGNOSIS — L89314 Pressure ulcer of right buttock, stage 4: Secondary | ICD-10-CM | POA: Diagnosis not present

## 2022-11-12 DIAGNOSIS — N1832 Chronic kidney disease, stage 3b: Secondary | ICD-10-CM | POA: Diagnosis not present

## 2022-11-12 DIAGNOSIS — R7302 Impaired glucose tolerance (oral): Secondary | ICD-10-CM | POA: Diagnosis not present

## 2022-11-12 DIAGNOSIS — I5032 Chronic diastolic (congestive) heart failure: Secondary | ICD-10-CM | POA: Diagnosis not present

## 2022-11-13 DIAGNOSIS — G4733 Obstructive sleep apnea (adult) (pediatric): Secondary | ICD-10-CM | POA: Diagnosis not present

## 2022-11-13 DIAGNOSIS — E1122 Type 2 diabetes mellitus with diabetic chronic kidney disease: Secondary | ICD-10-CM | POA: Diagnosis not present

## 2022-11-13 DIAGNOSIS — K219 Gastro-esophageal reflux disease without esophagitis: Secondary | ICD-10-CM | POA: Diagnosis not present

## 2022-11-13 DIAGNOSIS — Z9181 History of falling: Secondary | ICD-10-CM | POA: Diagnosis not present

## 2022-11-13 DIAGNOSIS — M3500 Sicca syndrome, unspecified: Secondary | ICD-10-CM | POA: Diagnosis not present

## 2022-11-13 DIAGNOSIS — M069 Rheumatoid arthritis, unspecified: Secondary | ICD-10-CM | POA: Diagnosis not present

## 2022-11-13 DIAGNOSIS — L89310 Pressure ulcer of right buttock, unstageable: Secondary | ICD-10-CM | POA: Diagnosis not present

## 2022-11-13 DIAGNOSIS — Z7985 Long-term (current) use of injectable non-insulin antidiabetic drugs: Secondary | ICD-10-CM | POA: Diagnosis not present

## 2022-11-13 DIAGNOSIS — G8929 Other chronic pain: Secondary | ICD-10-CM | POA: Diagnosis not present

## 2022-11-13 DIAGNOSIS — M80051D Age-related osteoporosis with current pathological fracture, right femur, subsequent encounter for fracture with routine healing: Secondary | ICD-10-CM | POA: Diagnosis not present

## 2022-11-13 DIAGNOSIS — I5032 Chronic diastolic (congestive) heart failure: Secondary | ICD-10-CM | POA: Diagnosis not present

## 2022-11-13 DIAGNOSIS — J329 Chronic sinusitis, unspecified: Secondary | ICD-10-CM | POA: Diagnosis not present

## 2022-11-13 DIAGNOSIS — M797 Fibromyalgia: Secondary | ICD-10-CM | POA: Diagnosis not present

## 2022-11-13 DIAGNOSIS — L89314 Pressure ulcer of right buttock, stage 4: Secondary | ICD-10-CM | POA: Diagnosis not present

## 2022-11-13 DIAGNOSIS — M9701XD Periprosthetic fracture around internal prosthetic right hip joint, subsequent encounter: Secondary | ICD-10-CM | POA: Diagnosis not present

## 2022-11-13 DIAGNOSIS — Z7982 Long term (current) use of aspirin: Secondary | ICD-10-CM | POA: Diagnosis not present

## 2022-11-13 DIAGNOSIS — I130 Hypertensive heart and chronic kidney disease with heart failure and stage 1 through stage 4 chronic kidney disease, or unspecified chronic kidney disease: Secondary | ICD-10-CM | POA: Diagnosis not present

## 2022-11-13 DIAGNOSIS — L89612 Pressure ulcer of right heel, stage 2: Secondary | ICD-10-CM | POA: Diagnosis not present

## 2022-11-13 DIAGNOSIS — M545 Low back pain, unspecified: Secondary | ICD-10-CM | POA: Diagnosis not present

## 2022-11-13 DIAGNOSIS — N1832 Chronic kidney disease, stage 3b: Secondary | ICD-10-CM | POA: Diagnosis not present

## 2022-11-14 NOTE — Progress Notes (Signed)
Office Visit Note  Patient: [name]             Date of Birth: 01/04/1951           MRN: [id number]             PCP: Carlean Jews, NP Referring: Mayer Masker, PA-C Visit Date: 11/28/2022 Occupation: @GUAROCC @  Subjective:  Pain in multiple joints  History of Present Illness: [name] is a 72 y.o. female with history of seropositive rheumatoid arthritis, osteoarthritis, degenerative disc disease and fibromyalgia syndrome.  She returns today after her last visit in January 2023.  We did a virtual visit in December 2023 because of the femur fracture.  Patient states that she fell on June 21, 2022 and acquired right femur fracture.  She underwent surgery and then she was in a rehab facility.  She developed a sacral decubitus ulcer for which she had been going to the wound care.  She also developed COVID-19 virus infection in February 2024 from which she gradually recovered.  She has been going to wound care on a regular basis and she states that the wound is gradually healing.  She recently developed an ulcer on her right heel for which also she is going to wound care.  She had been off Arava for last several months.  She decided to take dose of leflunomide last week.  Patient states that she continues to take hydroxychloroquine.  She complains of discomfort in her bilateral hands, her feet and her lower back.  Notices some swelling in her hands which she describes over the MCP joints.  She states the right femur fracture has healed well.  Has off-and-on discomfort in the left knee.  Right knee joint is replaced.  She has generalized pain and discomfort from fibromyalgia.    Activities of Daily Living:  Patient reports morning stiffness for 10-15 minutes.   Patient Reports nocturnal pain.  Difficulty dressing/grooming: Reports Difficulty climbing stairs: Reports Difficulty getting out of chair: Denies Difficulty using hands for taps, buttons, cutlery, and/or writing:  Denies  Review of Systems  Constitutional:  Positive for fatigue.  HENT:  Positive for mouth dryness. Negative for mouth sores.   Eyes:  Negative for dryness.  Respiratory:  Negative for shortness of breath.   Cardiovascular:  Negative for chest pain and palpitations.  Gastrointestinal:  Positive for diarrhea. Negative for blood in stool and constipation.  Endocrine: Negative for increased urination.  Genitourinary:  Negative for involuntary urination.  Musculoskeletal:  Positive for joint pain, joint pain, joint swelling, myalgias, muscle weakness, morning stiffness, muscle tenderness and myalgias.  Skin:  Positive for rash, hair loss and ulcers. Negative for color change and sensitivity to sunlight.  Allergic/Immunologic: Negative for susceptible to infections.  Neurological:  Negative for dizziness and headaches.  Hematological:  Positive for swollen glands.  Psychiatric/Behavioral:  Positive for sleep disturbance. Negative for depressed mood. The patient is not nervous/anxious.     PMFS History:  Patient Active Problem List   Diagnosis Date Noted   Situational anxiety 10/15/2022   Pressure sore of left ischial area, stage II 09/10/2022   Pain in right leg 09/10/2022   Aftercare for healing traumatic fracture of right femur 06/21/2022   Chronic kidney disease, stage 3b 06/21/2022   DNR (do not resuscitate) 06/21/2022   Agatston coronary artery calcium score greater than 400 05/25/2022   Laryngopharyngeal reflux (LPR) 10/10/2021   Chronic maxillary sinusitis 10/10/2021   Other fatigue 02/14/2021  Body mass index (BMI) of 36.0-36.9 in adult 02/14/2021   Prediabetes 02/14/2021   Abnormal kidney function 08/01/2020   Muscle spasm of back 08/01/2020   Microalbuminuria 08/01/2020   Post-menopausal 09/02/2019   Pain in left knee 08/23/2018   Lumbar pain 07/04/2018   Recurrent urticaria 05/20/2018   Rhinitis 05/20/2018   Vitamin D deficiency 11/20/2017   Mixed hyperlipidemia  11/20/2017   Family history of coronary arteriosclerosis- strong fam h/o CAD and early CAD.  07/17/2017   Raynaud's disease without gangrene 03/12/2017   Abnormal weight gain 02/06/2017   Shingles outbreak 02/06/2017   Cyst (solitary) of breast, left 01/31/2017   Eosinophilic esophagitis 10/12/2016   History of hyperlipidemia 10/04/2016   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   History of diverticulitis 09/03/2016   Osteoporosis 09/03/2016   Autoimmune disease 09/02/2016   High risk medication use 09/02/2016   History of esophagitis 09/02/2016   Elevated triglycerides with high cholesterol 08/23/2016   Low serum HDL 08/23/2016   Breast cyst, left 06/14/2016   Encounter for wellness examination 05/23/2016   Abnormality of gait 05/09/2016   Sjoegren syndrome 02/29/2016   GERD (gastroesophageal reflux disease) 01/19/2016   Glucose intolerance (impaired glucose tolerance) 01/19/2016   Chronic diastolic CHF (congestive heart failure) 04/20/2015   Peripheral polyneuropathy 02/02/2014   Class 2 obesity due to excess calories with body mass index (BMI) of 36.0 to 36.9 in adult 11/17/2013   Heart murmur    OSA (obstructive sleep apnea)    Rheumatoid arthritis    Hiatal hernia    Fibromyalgia    PVC's (premature ventricular contractions)    History of total knee replacement, right 06/24/2011   Unilateral primary osteoarthritis, left knee 06/22/2011    Past Medical History:  Diagnosis Date   Agatston coronary artery calcium score greater than 400 05/2022   coronary Ca score 856   Back pain    Blood transfusion    1981   Chronic diastolic CHF (congestive heart failure)    diastolic    COVID    DDD (degenerative disc disease), cervical    DDD (degenerative disc disease), lumbar    Fibromyalgia    GERD (gastroesophageal reflux disease)    Heart murmur    as a child   Hiatal hernia    sjorgens syndrome   High blood pressure    High cholesterol    IBS (irritable bowel  syndrome)    OSA (obstructive sleep apnea)    Osteoarthritis    Peripheral autonomic neuropathy of unknown cause    Pre-diabetes    PVC (premature ventricular contraction)    Raynaud disease    Rheumatoid arthritis    Sjogren's disease    Urticaria    Vitamin D deficiency     Family History  Problem Relation Age of Onset   Alzheimer's disease Mother    Heart attack Mother    Hypertension Mother    Glaucoma Mother    High Cholesterol Mother    Heart disease Mother    Depression Mother    Cancer Father    High Cholesterol Father    Heart disease Father    Sleep apnea Father    Breast cancer Maternal Aunt    Heart attack Brother    Heart disease Brother    Glaucoma Brother    Hyperlipidemia Brother    Glaucoma Brother    Asthma Son    Allergic rhinitis Neg Hx    Angioedema Neg Hx    Eczema Neg Hx  Urticaria Neg Hx    Neuropathy Neg Hx    Past Surgical History:  Procedure Laterality Date   ABDOMINAL HYSTERECTOMY     BTL, BSO   ADENOIDECTOMY     BREAST EXCISIONAL BIOPSY Left    BREAST SURGERY     mass removal    CHOLECYSTECTOMY     dental implants     DIAGNOSTIC LAPAROSCOPY     x3   FEMUR FRACTURE SURGERY Right    KNEE ARTHROSCOPY     x2   MASS EXCISION Left 03/22/2017   Procedure: EXCISION OF LEFT BREAST MASS;  Surgeon: Manus Rudd, MD;  Location: [facility];  Service: General;  Laterality: Left;   ORIF FEMUR FRACTURE Right 06/22/2022   Procedure: RIGHT OPEN REDUCTION INTERNAL FIXATION (ORIF) DISTAL FEMUR FRACTURE;  Surgeon: Myrene Galas, MD;  Location: [facility];  Service: Orthopedics;  Laterality: Right;   patotid cystectomy     RIGHT/LEFT HEART CATH AND CORONARY ANGIOGRAPHY N/A 07/25/2019   Procedure: RIGHT/LEFT HEART CATH AND CORONARY ANGIOGRAPHY;  Surgeon: Dolores Patty, MD;  Location: [facility];  Service: Cardiovascular;  Laterality: N/A;   TENDON REPAIR  1980   left ankle and tibia   TONSILLECTOMY     TOTAL KNEE ARTHROPLASTY  06/21/2011    Procedure: TOTAL KNEE ARTHROPLASTY;  Surgeon: Loanne Drilling;  Location: [facility];  Service: Orthopedics;  Laterality: Right;   TUBAL LIGATION     Social History   Social History Narrative   Lives at home alone   Right-handed   Drinks 1 or less cups of coffee and 2 servings of either tea or soda per day   Immunization History  Administered Date(s) Administered   Fluad Quad(high Dose 65+) 05/05/2021   Influenza, High Dose Seasonal PF 06/12/2018, 05/17/2019   Influenza,inj,Quad PF,6+ Mos 05/23/2016, 06/01/2017   Influenza-Unspecified 06/05/2018, 05/17/2019, 06/07/2020, 05/18/2022   PFIZER(Purple Top)SARS-COV-2 Vaccination 09/03/2019, 09/24/2019, 04/01/2020, 10/04/2020   Pfizer Covid-19 Vaccine Bivalent Booster 38yrs & up 05/05/2021, 05/18/2022   Pneumococcal Conjugate-13 07/02/2017   Pneumococcal Polysaccharide-23 05/17/2005   Respiratory Syncytial Virus Vaccine,Recomb Aduvanted(Arexvy) 05/18/2022   Td 10/16/2007   Tdap 03/21/2018   Zoster Recombinat (Shingrix) 06/10/2019, 12/11/2019     Objective: Vital Signs: BP 104/66 (BP Location: Left Arm, Patient Position: Sitting, Cuff Size: Normal)   Pulse 72   Resp 17   Ht 5\' 2"  (1.575 m)   Wt 212 lb (96.2 kg) Comment: per patient  BMI 38.78 kg/m    Physical Exam Vitals and nursing note reviewed.  Constitutional:      Appearance: She is well-developed.  HENT:     Head: Normocephalic and atraumatic.  Eyes:     Conjunctiva/sclera: Conjunctivae normal.  Cardiovascular:     Rate and Rhythm: Normal rate and regular rhythm.     Heart sounds: Normal heart sounds.  Pulmonary:     Effort: Pulmonary effort is normal.     Breath sounds: Normal breath sounds.  Abdominal:     General: Bowel sounds are normal.     Palpations: Abdomen is soft.  Musculoskeletal:     Cervical back: Normal range of motion.  Lymphadenopathy:     Cervical: No cervical adenopathy.  Skin:    General: Skin is warm and dry.     Capillary Refill: Capillary  refill takes less than 2 seconds.  Neurological:     Mental Status: She is alert and oriented to person, place, and time.  Psychiatric:        Behavior:  Behavior normal.      Musculoskeletal Exam: She had limited range of motion of the cervical spine.  Thoracolumbar scoliosis was noted.  Patient was in a wheelchair.  Shoulder joints and elbow joints were in good range of motion.  There was synovial thickening but no synovitis over MCP joints.  There was no synovitis over the PIP or DIP joints.  Hip joints were difficult to assess in the sitting position.  She had limited range of motion of her knee joints with discomfort.  Right foot was wrapped by the wound care.  Left foot had good range of motion without discomfort.  CDAI Exam: CDAI Score: -- Patient Global: 5 mm; Provider Global: 5 mm Swollen: --; Tender: -- Joint Exam 11/28/2022   No joint exam has been documented for this visit   There is currently no information documented on the homunculus. Go to the Rheumatology activity and complete the homunculus joint exam.  Investigation: No additional findings.  Imaging: No results found.  Recent Labs: Lab Results  Component Value Date   WBC 5.9 06/25/2022   HGB 9.5 (L) 06/25/2022   PLT 176 06/25/2022   NA 136 06/25/2022   K 4.2 06/25/2022   CL 103 06/25/2022   CO2 26 06/25/2022   GLUCOSE 106 (H) 06/25/2022   BUN 23 06/25/2022   CREATININE 1.28 (H) 06/25/2022   BILITOT 0.7 06/23/2022   ALKPHOS 45 06/23/2022   AST 26 06/23/2022   ALT 15 06/23/2022   PROT 5.4 (L) 06/23/2022   ALBUMIN 2.8 (L) 06/23/2022   CALCIUM 8.2 (L) 06/25/2022   GFRAA 58 (L) 09/23/2020    Speciality Comments: PLQ Eye Exam: 02/08/2021 Z[facility]@ [facility] follow up in 1 year   Procedures:  No procedures performed Allergies: Sulfa antibiotics, Cymbalta [duloxetine hcl], Demerol, Ivp dye [iodinated contrast media], Morphine and related, Sulfasalazine, and Adhesive [tape]   Assessment / Plan:      Visit Diagnoses: Rheumatoid arthritis involving multiple sites with positive rheumatoid factor - Positive RF, positive ANA: Patient continues to have pain and discomfort in multiple joints.  No synovitis was noted on the examination.  She has been off leflunomide since she was diagnosed with gluteal pressure ulcer.  She states she continues to take hydroxychloroquine.  Patient states she has a wound VAC for the gluteal ulcer.  She also developed a right heel ulcer.  She herself decided to restart leflunomide last week.  I advised her to stop leflunomide as she has active ulcer currently.  In my opinion she should be of hydroxychloroquine as well.  I advised her to discuss that further with the wound care specialist.  I do not see any synovitis on the examination.  Advised her that her rheumatoid arthritis is not active and does not need more aggressive immunosuppression at this time.  I advised her to call me once her ulcers have completely healed.  High risk medication use - Arava 20 mg 1 tablet by mouth daily and Plaquenil 200 mg 1 tablet by mouth by mouth daily.  Advised to reduce the dose of plaquenil due to CKD.  Patient was advised to discontinue leflunomide and also discontinue hydroxychloroquine unless approved by the wound care specialist.  Labs obtained on June 25, 2022 CBC showed hemoglobin of 9.5 creatinine was 1.28 and calcium was low at 8.2.  She has been followed by nephrology.  Will check labs today.- Plan: CBC with Differential/Platelet, COMPLETE METABOLIC PANEL WITH GFR.  Will contact her once the lab results are  available.  Sicca syndrome - ANA-, Ro-, and La-: She continues to have dry mouth and dry eye symptoms.  Over-the-counter products were discussed.  Raynaud's disease without gangrene-she had good capillary refill without any nailbed capillary changes or telangiectasia.  Chronic right shoulder pain-she has intermittent pain.  History of total knee replacement, right-she is  limited extension with some discomfort.  Unilateral primary osteoarthritis, left knee-she continues to have discomfort in her left knee joint.  DDD (degenerative disc disease), lumbar-she has chronic and in her lower back.  Pressure injury of right buttock, stage 4-patient states that she was diagnosed with a stage IV right gluteal ulcer in January 2024.  Since then she has been going to the wound care.  She states gradually healing.  Pressure injury of deep tissue of right heel-she developed an ulcer on her right heel for which she is going to wound care.  Fibromyalgia-she continues to generalized pain and discomfort from fibromyalgia.  Chronic SI joint pain-chronic pain.  Age-related osteoporosis without current pathological fracture - DEXA 08/27/2019: The BMD measured at Femur Total Right is 0.782 g/cm2 with a T-score of -1.8. s/p Forteo.  Status post fracture of femur-patient developed right femur fracture in November 2023 after a fall.  She states the fracture is healed well.  Other medical problems are listed as follows:  History of hyperlipidemia  History of cardiac murmur  History of diabetes mellitus  History of peripheral neuropathy  CKD (chronic kidney disease) stage 4, GFR 15-29 ml/min-she is followed by nephrology.  Eosinophilic esophagitis  Sjogren's syndrome with keratoconjunctivitis sicca  History of diverticulitis  History of depression  Orders: Orders Placed This Encounter  Procedures   CBC with Differential/Platelet   COMPLETE METABOLIC PANEL WITH GFR   No orders of the defined types were placed in this encounter.    Follow-Up Instructions: Return in about 6 months (around 05/30/2023) for Rheumatoid arthritis.   Pollyann Savoy, MD  Note - This record has been created using Animal nutritionist.  Chart creation errors have been sought, but may not always  have been located. Such creation errors do not reflect on  the standard of medical care.

## 2022-11-15 ENCOUNTER — Encounter (HOSPITAL_BASED_OUTPATIENT_CLINIC_OR_DEPARTMENT_OTHER): Payer: PPO | Attending: General Surgery | Admitting: General Surgery

## 2022-11-15 DIAGNOSIS — I7300 Raynaud's syndrome without gangrene: Secondary | ICD-10-CM | POA: Insufficient documentation

## 2022-11-15 DIAGNOSIS — M069 Rheumatoid arthritis, unspecified: Secondary | ICD-10-CM | POA: Diagnosis not present

## 2022-11-15 DIAGNOSIS — E785 Hyperlipidemia, unspecified: Secondary | ICD-10-CM | POA: Diagnosis not present

## 2022-11-15 DIAGNOSIS — G4730 Sleep apnea, unspecified: Secondary | ICD-10-CM | POA: Diagnosis not present

## 2022-11-15 DIAGNOSIS — N1832 Chronic kidney disease, stage 3b: Secondary | ICD-10-CM | POA: Insufficient documentation

## 2022-11-15 DIAGNOSIS — L89314 Pressure ulcer of right buttock, stage 4: Secondary | ICD-10-CM | POA: Insufficient documentation

## 2022-11-15 DIAGNOSIS — L89613 Pressure ulcer of right heel, stage 3: Secondary | ICD-10-CM | POA: Diagnosis not present

## 2022-11-15 DIAGNOSIS — I5032 Chronic diastolic (congestive) heart failure: Secondary | ICD-10-CM | POA: Diagnosis not present

## 2022-11-15 DIAGNOSIS — G629 Polyneuropathy, unspecified: Secondary | ICD-10-CM | POA: Diagnosis not present

## 2022-11-15 NOTE — Progress Notes (Signed)
[name], [name] ([id number]) [id number] Page 1 of 10 Visit Report for 11/15/2022 Chief Complaint Document Details Patient Name: Date of Service: [name], [name] 11/15/2022 2:00 PM Medical Record Number: [id number] Patient Account Number: [id number] Date of Birth/Sex: Treating RN: February 22, 1951 (72 y.o. F) Primary Care Provider: Leretha Pol Other Clinician: Referring Provider: Treating Provider/Extender: Olin Pia in Treatment: 9 Information Obtained from: Patient Chief Complaint Patient is at the clinic for treatment of open pressure ulcers Electronic Signature(s) Signed: 11/15/2022 3:12:14 PM By: Fredirick Maudlin MD FACS Entered By: Fredirick Maudlin on 11/15/2022 15:12:13 -------------------------------------------------------------------------------- Debridement Details Patient Name: Date of Service: [name]. 11/15/2022 2:00 PM Medical Record Number: [id number] Patient Account Number: [id number] Date of Birth/Sex: Treating RN: 07/02/1951 (72 y.o. [name] Primary Care Provider: Leretha Pol Other Clinician: Referring Provider: Treating Provider/Extender: Olin Pia in Treatment: 9 Debridement Performed for Assessment: Wound #3 Right Calcaneus Performed By: Physician Fredirick Maudlin, MD Debridement Type: Debridement Level of Consciousness (Pre-procedure): Awake and Alert Pre-procedure Verification/Time Out Yes - 14:40 Taken: Start Time: 14:43 Pain Control: Lidocaine 4% T opical Solution T Area Debrided (L x W): otal 2.5 (cm) x 4.2 (cm) = 10.5 (cm) Tissue and other material debrided: Viable, Non-Viable, Eschar, Slough, Subcutaneous, Slough Level: Skin/Subcutaneous Tissue Debridement Description: Excisional Instrument: Blade, Curette, Forceps Bleeding: Minimum Hemostasis Achieved: Pressure Procedural Pain: 3 Post Procedural Pain: 1 Response to Treatment: Procedure was  tolerated well Level of Consciousness (Post- Awake and Alert procedure): Post Debridement Measurements of Total Wound Length: (cm) 2.5 Stage: Category/Stage III Width: (cm) 4.2 Depth: (cm) 0.3 Volume: (cm) 2.474 Character of Wound/Ulcer Post Debridement: Improved Post Procedure Diagnosis Same as Pre-procedure Notes Scribed for Dr. Celine Ahr by Baruch Gouty, RN Electronic Signature(s) Signed: 11/15/2022 4:09:11 PM By: Fredirick Maudlin MD FACS Signed: 11/15/2022 6:19:38 PM By: Baruch Gouty RN, BSN Entered By: Baruch Gouty on 11/15/2022 14:50:37 [name] ([id number]) [id number] Page 2 of 10 -------------------------------------------------------------------------------- HPI Details Patient Name: Date of Service: [name] 11/15/2022 2:00 PM Medical Record Number: [id number] Patient Account Number: [id number] Date of Birth/Sex: Treating RN: 1951/04/02 (72 y.o. F) Primary Care Provider: Leretha Pol Other Clinician: Referring Provider: Treating Provider/Extender: Olin Pia in Treatment: 9 History of Present Illness HPI Description: ADMISSION 09/07/2023 This is a 72 year old woman with a past medical history notable for obesity, congestive heart failure, Raynaud's syndrome, CKD stage IIIb, osteoporosis, and rheumatoid arthritis. In November 2023, she suffered a fall that resulted in a femur fracture. She was hospitalized for about a week and underwent surgical repair of the fracture. She subsequently developed pressure ulcers on her right buttocks and ischium. She has been receiving home health services and they have been applying Medihoney. They have been reporting the wounds as stage II, but on evaluation, the large ulcer was probably unstageable at the time they were evaluating it but it is clearly a stage IV at this point. The smaller ulcer does have fat layer exposure and therefore is a stage III. The patient  is accompanied by her daughter. She says she has been sleeping on a regular bed but recently ordered an air mattress T opper, but does not have it yet. She is on Ozempic for weight loss and therefore has a poor appetite and struggles to get adequate protein intake. 09/15/2022: The large stage IV ulcer is substantially cleaner this week. The stage III ulcer is a little smaller. Both have slough accumulation. The culture that I took  last week was polymicrobial. The Augmentin that I prescribed was adequate coverage for the species and she continues to take this. We have also ordered Keystone topical antibiotic compound, but this has not yet arrived. 09/21/2022: The stage IV ulcer has some necrotic muscle at the base but is otherwise fairly clean. The stage III ulcer is smaller again with light slough on the surface. She has her Keystone topical antibiotic compound with her today. 09/29/2022: There is still some necrotic muscle at the base of the stage IV ulcer that I was unable to get to last week. The stage III ulcer has healed. She unfortunately has developed a new pressure ulcer on her right heel. 10/06/2022: Still with some devitalized muscle at the base of the stage IV ulcer, but otherwise this wound is looking quite clean. The pressure induced tissue injury on her heel is demarcating and much of the area appears to be epithelialized, but there is still 2 areas that remain questionable. 10/12/2022: The stage IV ulcer is very clean and ready for wound VAC, which will be delivered tomorrow according to the patient. The tissue injury on her heel is drying up and does not feel particularly boggy this week. 10/19/2022: The heel injury continues to improve. There is still some dry eschar present on the more plantar aspect of it. There is some slough accumulation on the surface of the stage IV pressure ulcer. The wound VAC was initiated by home health last week. 11/01/2022: The gluteal pressure ulcer is very clean  without any necrotic tissue or slough. The depth has come in by over half a centimeter. The deep tissue injury on her heel continues to improve. There is still dry eschar that we are painting with Betadine as well as some fresh-looking viable tissue at the margin. 11/15/2022: The gluteal pressure ulcer is clean and contracting. The eschar on her heel ulcer is beginning to separate from the underlying tissue. Electronic Signature(s) Signed: 11/15/2022 3:12:51 PM By: Fredirick Maudlin MD FACS Entered By: Fredirick Maudlin on 11/15/2022 15:12:51 -------------------------------------------------------------------------------- Physical Exam Details Patient Name: Date of Service: Marisa Ar Cooper. 11/15/2022 2:00 PM Medical Record Number: [id number] Patient Account Number: [id number] Date of Birth/Sex: Treating RN: [birth year] (72 y.o. F) Primary Care Provider: Leretha Pol Other Clinician: Referring Provider: Treating Provider/Extender: Olin Pia in Treatment: 9 Constitutional . . . . no acute distress. Respiratory Normal work of breathing on room air. Notes 11/15/2022: The gluteal pressure ulcer is clean and contracting. The eschar on her heel ulcer is beginning to separate from the underlying tissue. Electronic Signature(s) Signed: 11/15/2022 3:13:17 PM By: Fredirick Maudlin MD FACS Entered By: Fredirick Maudlin on 11/15/2022 15:13:17 [name] ([id number]) [id number] Page 3 of 10 -------------------------------------------------------------------------------- Physician Orders Details Patient Name: Date of Service: [name], [name] 11/15/2022 2:00 PM Medical Record Number: [id number] Patient Account Number: [id number] Date of Birth/Sex: Treating RN: 1950-11-14 (72 y.o. [name] Primary Care Provider: Leretha Pol Other Clinician: Referring Provider: Treating Provider/Extender: Olin Pia in  Treatment: 9 Verbal / Phone Orders: No Diagnosis Coding ICD-10 Coding Code Description L89.314 Pressure ulcer of right buttock, stage 4 L89.610 Pressure ulcer of right heel, unstageable I73.00 Raynaud's syndrome without gangrene 99991111 Chronic diastolic (congestive) heart failure N18.32 Chronic kidney disease, stage 3b R73.02 Impaired glucose tolerance (oral) E66.9 Obesity, unspecified Follow-up Appointments ppointment in 2 weeks. - Dr. Celine Ahr RM 1 Return A Wednesday 4/17 @ 2:00 pm Anesthetic Wound #1 Right Gluteus (In clinic) Topical Lidocaine  4% applied to wound bed Wound #3 Right Calcaneus (In clinic) Topical Lidocaine 4% applied to wound bed Bathing/ Shower/ Hygiene May shower and wash wound with soap and water. Negative Presssure Wound Therapy Wound #1 Right Gluteus Wound Vac to wound continuously at 123mm/hg pressure Black Foam Off-Loading Low air-loss mattress (Group 2) - Adapt Turn and reposition every 2 hours - avoid lying on back, stand at least every hour while out of bed, float heels off bed with pillows under calves while in bed Prevalon Boot - right foot esp. while in bed. Additional Orders / Instructions Follow Nutritious Diet - increase protein to 70-80 gms per day, hold ozempic Juven Shake 1-2 times daily. Home Health New wound care orders this week; continue Home Health for wound care. May utilize formulary equivalent dressing for wound treatment orders unless otherwise specified. Other Home Health Orders/Instructions: - [facility] Wound Treatment Wound #1 - Gluteus Wound Laterality: Right Cleanser: Wound Cleanser 3 x Per Week/30 Days Discharge Instructions: Cleanse the wound with wound cleanser prior to applying a clean dressing using gauze sponges, not tissue or cotton balls. Prim Dressing: Promogran Prisma Matrix, 4.34 (sq in) (silver collagen) 3 x Per Week/30 Days ary Discharge Instructions: Moisten collagen with saline or hydrogel and place at base  of wound under VAC sponge Prim Dressing: VAC ary 3 x Per Week/30 Days Wound #3 - Calcaneus Wound Laterality: Right Prim Dressing: Maxorb Extra Ag+ Alginate Dressing, 2x2 (in/in) 1 x Per Day/30 Days ary Discharge Instructions: Apply to pink area of wound bed Secondary Dressing: ALLEVYN Heel 4 1/2in x 5 1/2in / 10.5cm x 13.5cm 1 x Per Day/30 Days [name], [name] ([id number]) [id number] Page 4 of 10 Discharge Instructions: Apply over primary dressing as directed. Secondary Dressing: Woven Gauze Sponge, Non-Sterile 4x4 in 1 x Per Day/30 Days Discharge Instructions: Apply over primary dressing as directed. Secured With: [employer], 4.5x3.1 (in/yd) 1 x Per Day/30 Days Discharge Instructions: Secure with Kerlix as directed. Secured With: Paper Tape, 2x10 (in/yd) 1 x Per Day/30 Days Discharge Instructions: Secure dressing with tape as directed. Electronic Signature(s) Signed: 11/15/2022 4:09:11 PM By: Fredirick Maudlin MD FACS Entered By: Fredirick Maudlin on 11/15/2022 15:13:28 -------------------------------------------------------------------------------- Problem List Details Patient Name: Date of Service: [name]. 11/15/2022 2:00 PM Medical Record Number: [id number] Patient Account Number: [id number] Date of Birth/Sex: Treating RN: 1950/12/16 (72 y.o. F) Primary Care Provider: Leretha Pol Other Clinician: Referring Provider: Treating Provider/Extender: Olin Pia in Treatment: 9 Active Problems ICD-10 Encounter Code Description Active Date MDM Diagnosis L89.314 Pressure ulcer of right buttock, stage 4 09/07/2022 No Yes L89.610 Pressure ulcer of right heel, unstageable 09/28/2022 No Yes I73.00 Raynaud's syndrome without gangrene 09/07/2022 No Yes 99991111 Chronic diastolic (congestive) heart failure 09/07/2022 No Yes N18.32 Chronic kidney disease, stage 3b 09/07/2022 No Yes R73.02 Impaired glucose tolerance (oral)  09/07/2022 No Yes E66.9 Obesity, unspecified 09/07/2022 No Yes Inactive Problems ICD-10 Code Description Active Date Inactive Date L89.313 Pressure ulcer of right buttock, stage 3 09/07/2022 09/07/2022 Resolved Problems [name], [name] ([id number]) [id number] Page 5 of 10 Electronic Signature(s) Signed: 11/15/2022 3:12:01 PM By: Fredirick Maudlin MD FACS Previous Signature: 11/15/2022 2:25:54 PM Version By: Fredirick Maudlin MD FACS Entered By: Fredirick Maudlin on 11/15/2022 15:12:00 -------------------------------------------------------------------------------- Progress Note Details Patient Name: Date of Service: Marisa Franchot Gallo Cooper. 11/15/2022 2:00 PM Medical Record Number: [id number] Patient Account Number: [id number] Date of Birth/Sex: Treating RN: [birth year] (72 y.o. F) Primary Care Provider: Leretha Pol Other Clinician: Referring Provider:  Treating Provider/Extender: Olin Pia in Treatment: 9 Subjective Chief Complaint Information obtained from Patient Patient is at the clinic for treatment of open pressure ulcers History of Present Illness (HPI) ADMISSION 09/07/2023 This is a 72 year old woman with a past medical history notable for obesity, congestive heart failure, Raynaud's syndrome, CKD stage IIIb, osteoporosis, and rheumatoid arthritis. In November 2023, she suffered a fall that resulted in a femur fracture. She was hospitalized for about a week and underwent surgical repair of the fracture. She subsequently developed pressure ulcers on her right buttocks and ischium. She has been receiving home health services and they have been applying Medihoney. They have been reporting the wounds as stage II, but on evaluation, the large ulcer was probably unstageable at the time they were evaluating it but it is clearly a stage IV at this point. The smaller ulcer does have fat layer exposure and therefore is a stage III. The patient  is accompanied by her daughter. She says she has been sleeping on a regular bed but recently ordered an air mattress T opper, but does not have it yet. She is on Ozempic for weight loss and therefore has a poor appetite and struggles to get adequate protein intake. 09/15/2022: The large stage IV ulcer is substantially cleaner this week. The stage III ulcer is a little smaller. Both have slough accumulation. The culture that I took last week was polymicrobial. The Augmentin that I prescribed was adequate coverage for the species and she continues to take this. We have also ordered Keystone topical antibiotic compound, but this has not yet arrived. 09/21/2022: The stage IV ulcer has some necrotic muscle at the base but is otherwise fairly clean. The stage III ulcer is smaller again with light slough on the surface. She has her Keystone topical antibiotic compound with her today. 09/29/2022: There is still some necrotic muscle at the base of the stage IV ulcer that I was unable to get to last week. The stage III ulcer has healed. She unfortunately has developed a new pressure ulcer on her right heel. 10/06/2022: Still with some devitalized muscle at the base of the stage IV ulcer, but otherwise this wound is looking quite clean. The pressure induced tissue injury on her heel is demarcating and much of the area appears to be epithelialized, but there is still 2 areas that remain questionable. 10/12/2022: The stage IV ulcer is very clean and ready for wound VAC, which will be delivered tomorrow according to the patient. The tissue injury on her heel is drying up and does not feel particularly boggy this week. 10/19/2022: The heel injury continues to improve. There is still some dry eschar present on the more plantar aspect of it. There is some slough accumulation on the surface of the stage IV pressure ulcer. The wound VAC was initiated by home health last week. 11/01/2022: The gluteal pressure ulcer is very clean  without any necrotic tissue or slough. The depth has come in by over half a centimeter. The deep tissue injury on her heel continues to improve. There is still dry eschar that we are painting with Betadine as well as some fresh-looking viable tissue at the margin. 11/15/2022: The gluteal pressure ulcer is clean and contracting. The eschar on her heel ulcer is beginning to separate from the underlying tissue. Patient History Information obtained from Patient, Caregiver, Chart. Family History Cancer - Father,Paternal Grandparents, Heart Disease - Mother,Father,Paternal Grandparents, Hypertension - Mother, Stroke - Maternal Grandparents, No family history of  Diabetes, Hereditary Spherocytosis, Kidney Disease, Lung Disease, Seizures, Thyroid Problems, Tuberculosis. 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 Medical History Eyes Denies history of Cataracts, Glaucoma, Optic Neuritis Ear/Nose/Mouth/Throat Patient has history of Chronic sinus problems/congestion - chronic rhinitis Denies history of Middle ear problems Respiratory Patient has history of Sleep Apnea - uses CPAP Cardiovascular Patient has history of Congestive Heart Failure Endocrine Denies history of Type I Diabetes, Type II Diabetes Genitourinary Denies history of End Stage Renal Disease Immunological Patient has history of Raynaudoos Denies history of Lupus Erythematosus, Scleroderma [name], [name] ([id number]) [id number] Page 6 of 10 Integumentary (Skin) Denies history of History of Burn Musculoskeletal Patient has history of Rheumatoid Arthritis Denies history of Gout, Osteoarthritis, Osteomyelitis Neurologic Patient has history of Neuropathy Denies history of Dementia, Quadriplegia, Paraplegia, Seizure Disorder Oncologic Denies history of Received Chemotherapy, Received  Radiation Psychiatric Denies history of Anorexia/bulimia, Confinement Anxiety Hospitalization/Surgery History - right femur fx ORIR. - right knee replacement. - left breast mass excision. - left ankle tibia repair. - abdominal hysterectomy. - adnoidectomy/tonsillectomy. - cholecystectomy. - dental implants. - patoid cystectomy. - tubal ligation. Medical A Surgical History Notes nd Constitutional Symptoms (General Health) morbid obesity Cardiovascular hyperlipidemia Gastrointestinal GERD, IBS, eosinophilic esophagitis, diverticulosis, Genitourinary CKD stage 3 Immunological Sjoegren syndrome, fibromyalgia Objective Constitutional no acute distress. Vitals Time Taken: 2:17 PM, Height: 62 in, Weight: 207 lbs, BMI: 37.9, Temperature: 98.6 F, Pulse: 72 bpm, Respiratory Rate: 18 breaths/min, Blood Pressure: 126/61 mmHg. Respiratory Normal work of breathing on room air. General Notes: 11/15/2022: The gluteal pressure ulcer is clean and contracting. The eschar on her heel ulcer is beginning to separate from the underlying tissue. Integumentary (Hair, Skin) Wound #1 status is Open. Original cause of wound was Pressure Injury. The date acquired was: 08/11/2022. The wound has been in treatment 9 weeks. The wound is located on the Right Gluteus. The wound measures 2.5cm length x 4cm width x 6.2cm depth; 7.854cm^2 area and 48.695cm^3 volume. There is Fat Layer (Subcutaneous Tissue) exposed. There is no tunneling noted, however, there is undermining starting at 1:00 and ending at 3:00 with a maximum distance of 10.2cm. There is a large amount of serosanguineous drainage noted. The wound margin is well defined and not attached to the wound base. There is large (67-100%) red granulation within the wound bed. There is no necrotic tissue within the wound bed. The periwound skin appearance had no abnormalities noted for texture. The periwound skin appearance had no abnormalities noted for moisture. The  periwound skin appearance had no abnormalities noted for color. Periwound temperature was noted as No Abnormality. The periwound has tenderness on palpation. Wound #3 status is Open. Original cause of wound was Pressure Injury. The date acquired was: 09/21/2022. The wound has been in treatment 6 weeks. The wound is located on the Right Calcaneus. The wound measures 2.5cm length x 4.2cm width x 0.1cm depth; 8.247cm^2 area and 0.825cm^3 volume. There is Fat Layer (Subcutaneous Tissue) exposed. There is no tunneling or undermining noted. There is a small amount of serosanguineous drainage noted. The wound margin is flat and intact. There is small (1-33%) pink granulation within the wound bed. There is a large (67-100%) amount of necrotic tissue within the wound bed including Eschar. The periwound skin appearance had no abnormalities noted for texture. The periwound skin appearance had no abnormalities noted for color. The periwound skin appearance exhibited: Dry/Scaly. The periwound  skin appearance did not exhibit: Maceration. Periwound temperature was noted as No Abnormality. The periwound has tenderness on palpation. Assessment Active Problems ICD-10 Pressure ulcer of right buttock, stage 4 Pressure ulcer of right heel, unstageable Raynaud's syndrome without gangrene Chronic diastolic (congestive) heart failure Chronic kidney disease, stage 3b Impaired glucose tolerance (oral) Obesity, unspecified [name], [name] ([id number]) [id number] Page 7 of 10 Procedures Wound #3 Pre-procedure diagnosis of Wound #3 is a Pressure Ulcer located on the Right Calcaneus . There was a Excisional Skin/Subcutaneous Tissue Debridement with a total area of 10.5 sq cm performed by Fredirick Maudlin, MD. With the following instrument(s): Blade, Curette, and Forceps to remove Viable and Non-Viable tissue/material. Material removed includes Eschar, Subcutaneous Tissue, and Slough after  achieving pain control using Lidocaine 4% T opical Solution. No specimens were taken. A time out was conducted at 14:40, prior to the start of the procedure. A Minimum amount of bleeding was controlled with Pressure. The procedure was tolerated well with a pain level of 3 throughout and a pain level of 1 following the procedure. Post Debridement Measurements: 2.5cm length x 4.2cm width x 0.3cm depth; 2.474cm^3 volume. Post debridement Stage noted as Category/Stage III. Character of Wound/Ulcer Post Debridement is improved. Post procedure Diagnosis Wound #3: Same as Pre-Procedure General Notes: Scribed for Dr. Celine Ahr by Baruch Gouty, RN. Plan Follow-up Appointments: Return Appointment in 2 weeks. - Dr. Celine Ahr RM 1 Wednesday 4/17 @ 2:00 pm Anesthetic: Wound #1 Right Gluteus: (In clinic) Topical Lidocaine 4% applied to wound bed Wound #3 Right Calcaneus: (In clinic) Topical Lidocaine 4% applied to wound bed Bathing/ Shower/ Hygiene: May shower and wash wound with soap and water. Negative Presssure Wound Therapy: Wound #1 Right Gluteus: Wound Vac to wound continuously at 119mm/hg pressure Black Foam Off-Loading: Low air-loss mattress (Group 2) - Adapt Turn and reposition every 2 hours - avoid lying on back, stand at least every hour while out of bed, float heels off bed with pillows under calves while in bed Prevalon Boot - right foot esp. while in bed. Additional Orders / Instructions: Follow Nutritious Diet - increase protein to 70-80 gms per day, hold ozempic Juven Shake 1-2 times daily. Home Health: New wound care orders this week; continue Home Health for wound care. May utilize formulary equivalent dressing for wound treatment orders unless otherwise specified. Other Home Health Orders/Instructions: - [facility] WOUND #1: - Gluteus Wound Laterality: Right Cleanser: Wound Cleanser 3 x Per Week/30 Days Discharge Instructions: Cleanse the wound with wound cleanser prior to applying  a clean dressing using gauze sponges, not tissue or cotton balls. Prim Dressing: Promogran Prisma Matrix, 4.34 (sq in) (silver collagen) 3 x Per Week/30 Days ary Discharge Instructions: Moisten collagen with saline or hydrogel and place at base of wound under VAC sponge Prim Dressing: VAC 3 x Per Week/30 Days ary WOUND #3: - Calcaneus Wound Laterality: Right Prim Dressing: Maxorb Extra Ag+ Alginate Dressing, 2x2 (in/in) 1 x Per Day/30 Days ary Discharge Instructions: Apply to pink area of wound bed Secondary Dressing: ALLEVYN Heel 4 1/2in x 5 1/2in / 10.5cm x 13.5cm 1 x Per Day/30 Days Discharge Instructions: Apply over primary dressing as directed. Secondary Dressing: Woven Gauze Sponge, Non-Sterile 4x4 in 1 x Per Day/30 Days Discharge Instructions: Apply over primary dressing as directed. Secured With: [employer], 4.5x3.1 (in/yd) 1 x Per Day/30 Days Discharge Instructions: Secure with Kerlix as directed. Secured With: Paper T ape, 2x10 (in/yd) 1 x Per Day/30 Days Discharge Instructions: Secure dressing with  tape as directed. 11/15/2022: The gluteal pressure ulcer is clean and contracting. The eschar on her heel ulcer is beginning to separate from the underlying tissue. No debridement was necessary for the gluteal ulcer. I used a combination of forceps, scalpel, and curette to debride the heel ulcer. I was able to remove the black eschar. Underneath this, I encountered nonviable fat and subcutaneous tissue, both of which were also debrided back to bleeding tissues. We will continue to use the wound VAC on her gluteal ulcer. We will use silver alginate on the heel. Continue Prevalon boot and other offloading measures for the heel. Follow-up in 2 weeks. Electronic Signature(s) Signed: 11/15/2022 3:14:28 PM By: Fredirick Maudlin MD FACS Entered By: Fredirick Maudlin on 11/15/2022 15:14:28 -------------------------------------------------------------------------------- HxROS  Details Patient Name: Date of Service: Marisa Franchot Gallo Cooper. 11/15/2022 2:00 PM Medical Record Number: [id number] Patient Account Number: [id number] [name], [name] ([id number]) [id number] Page 8 of 10 Date of Birth/Sex: Treating RN: 12-16-1950 (72 y.o. F) Primary Care Provider: Other Clinician: Leretha Pol Referring Provider: Treating Provider/Extender: Olin Pia in Treatment: 9 Information Obtained From Patient Caregiver Chart Constitutional Symptoms (General Health) Medical History: Past Medical History Notes: morbid obesity Eyes Medical History: Negative for: Cataracts; Glaucoma; Optic Neuritis Ear/Nose/Mouth/Throat Medical History: Positive for: Chronic sinus problems/congestion - chronic rhinitis Negative for: Middle ear problems Respiratory Medical History: Positive for: Sleep Apnea - uses CPAP Cardiovascular Medical History: Positive for: Congestive Heart Failure Past Medical History Notes: hyperlipidemia Gastrointestinal Medical History: Past Medical History Notes: GERD, IBS, eosinophilic esophagitis, diverticulosis, Endocrine Medical History: Negative for: Type I Diabetes; Type II Diabetes Genitourinary Medical History: Negative for: End Stage Renal Disease Past Medical History Notes: CKD stage 3 Immunological Medical History: Positive for: Raynauds Negative for: Lupus Erythematosus; Scleroderma Past Medical History Notes: Sjoegren syndrome, fibromyalgia Integumentary (Skin) Medical History: Negative for: History of Burn Musculoskeletal Medical History: Positive for: Rheumatoid Arthritis Negative for: Gout; Osteoarthritis; Osteomyelitis Neurologic Medical History: Positive for: Neuropathy Negative for: Dementia; Quadriplegia; Paraplegia; Seizure Disorder [name], [name] ([id number]) [id number] Page 9 of 10 Oncologic Medical History: Negative for: Received  Chemotherapy; Received Radiation Psychiatric Medical History: Negative for: Anorexia/bulimia; Confinement Anxiety HBO Extended History Items Ear/Nose/Mouth/Throat: Chronic sinus problems/congestion Immunizations Pneumococcal Vaccine: Received Pneumococcal Vaccination: Yes Received Pneumococcal Vaccination On or After 60th Birthday: Yes Implantable Devices None Hospitalization / Surgery History Type of Hospitalization/Surgery right femur fx ORIR right knee replacement left breast mass excision left ankle tibia repair abdominal hysterectomy adnoidectomy/tonsillectomy cholecystectomy dental implants patoid cystectomy tubal ligation Family and Social History Cancer: Yes - Father,Paternal Grandparents; Diabetes: No; Heart Disease: Yes - Mother,Father,Paternal Grandparents; Hereditary Spherocytosis: No; Hypertension: Yes - Mother; Kidney Disease: No; Lung Disease: No; Seizures: No; Stroke: Yes - Maternal Grandparents; Thyroid Problems: No; Tuberculosis: No; Never smoker; Marital Status - Widowed; Alcohol Use: Rarely; Drug Use: Prior History - TCH; Caffeine Use: Daily - soda, tea; Financial Concerns: No; Food, Clothing or Shelter Needs: No; Support System Lacking: No; Transportation Concerns: No Electronic Signature(s) Signed: 11/15/2022 4:09:11 PM By: Fredirick Maudlin MD FACS Entered By: Fredirick Maudlin on 11/15/2022 15:12:57 -------------------------------------------------------------------------------- SuperBill Details Patient Name: Date of Service: [name] 11/15/2022 Medical Record Number: [id number] Patient Account Number: [id number] Date of Birth/Sex: Treating RN: March 03, 1951 (72 y.o. 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) E66.9 Obesity, unspecified Facility Procedures : MUSHKA, MOLITORIS CodeRance Muir ([id number]) [id number] I Description: [id number] - DEB SUBQ TISSUE 20 SQ CM/< CD-10 Diagnosis Description L89.610 Pressure ulcer of right heel, unstageable Modifier: [id number] 1 Quantity: df Page 10 of 10 : CPT4 Code: [id number] Description: [id number] - WOUND VAC-50 SQ CM OR LESS 24 Modifier: 1 Quantity: Physician Procedures : CPT4 Code Description Modifier [id number] - WC PHYS LEVEL 4 - EST PT 25 ICD-10 Diagnosis Description L89.314 Pressure ulcer of right buttock, stage 4 L89.610 Pressure ulcer of right heel, unstageable [id number] Chronic diastolic (congestive) heart  failure N18.32 Chronic kidney disease, stage 3b Quantity: 1 : [id number] - WC PHYS SUBQ TISS 20 SQ CM ICD-10 Diagnosis Description L89.610 Pressure ulcer of right heel, unstageable Quantity: 1 Electronic Signature(s) Signed: 11/15/2022 6:19:38 PM By: Baruch Gouty RN, BSN Signed: 11/16/2022 8:21:02 AM By: Fredirick Maudlin MD FACS Previous Signature: 11/15/2022 3:16:35 PM Version By: Fredirick Maudlin MD FACS Entered By: Baruch Gouty on 11/15/2022 16:56:10

## 2022-11-16 NOTE — Progress Notes (Signed)
[name], [name] ([id number]) [id number] Page 1 of 9 Visit Report for 11/15/2022 Arrival Information Details Patient Name: Date of Service: [name], [name] 11/15/2022 2:00 PM Medical Record Number: [id number] Patient Account Number: [id number] Date of Birth/Sex: Treating RN: 02-15-1951 (72 y.o. F) Primary Care Azzan Butler: Leretha Pol Other Clinician: Referring Bern Fare: Treating Juliene Kirsh/Extender: Olin Pia in Treatment: 9 Visit Information History Since Last Visit Added or deleted any medications: No Patient Arrived: Wheel Chair Any new allergies or adverse reactions: No Arrival Time: 14:17 Had a fall or experienced change in No Accompanied By: dughter activities of daily living that may affect Transfer Assistance: None risk of falls: Patient Identification Verified: Yes Signs or symptoms of abuse/neglect since last visito No Secondary Verification Process Completed: Yes Hospitalized since last visit: No Patient Requires Transmission-Based Precautions: No Implantable device outside of the clinic excluding No Patient Has Alerts: No cellular tissue based products placed in the center since last visit: Has Dressing in Place as Prescribed: Yes Pain Present Now: Yes Electronic Signature(s) Signed: 11/15/2022 6:19:38 PM By: Baruch Gouty RN, BSN Entered By: Baruch Gouty on 11/15/2022 14:20:00 -------------------------------------------------------------------------------- Encounter Discharge Information Details Patient Name: Date of Service: [name]. 11/15/2022 2:00 PM Medical Record Number: [id number] Patient Account Number: [id number] Date of Birth/Sex: Treating RN: 1951/01/25 (72 y.o. [name] Primary Care Chamari Cutbirth: Leretha Pol Other Clinician: Referring Kelleen Stolze: Treating Jadin Creque/Extender: Olin Pia in Treatment: 9 Encounter Discharge Information Items Post Procedure  Vitals Discharge Condition: Stable Temperature (F): 98.6 Ambulatory Status: Wheelchair Pulse (bpm): 72 Discharge Destination: Home Respiratory Rate (breaths/min): 18 Transportation: Private Auto Blood Pressure (mmHg): 126/61 Accompanied By: daughter Schedule Follow-up Appointment: Yes Clinical Summary of Care: Patient Declined Electronic Signature(s) Signed: 11/15/2022 6:19:38 PM By: Baruch Gouty RN, BSN Entered By: Baruch Gouty on 11/15/2022 16:57:38 -------------------------------------------------------------------------------- Lower Extremity Assessment Details Patient Name: Date of Service: [name]. 11/15/2022 2:00 PM Medical Record Number: [id number] Patient Account Number: [id number] Date of Birth/Sex: Treating RN: 1951-02-26 (72 y.o. F) Primary Care Ninah Moccio: Leretha Pol Other Clinician: Referring Yesika Rispoli: Treating Keaghan Staton/Extender: Olin Pia in Treatment: 9 Edema Assessment Assessed: [Left: No] [Right: No] C[LeftSIRIYAH, [name] [id number] [Right: [id number] Page 2 of 9] Edema: [Left: Ye] [Right: s] Calf Left: Right: Point of Measurement: From Medial Instep 40 cm Ankle Left: Right: Point of Measurement: From Medial Instep 25.5 cm Vascular Assessment Pulses: Dorsalis Pedis Palpable: [Right:Yes] Electronic Signature(s) Signed: 11/15/2022 6:19:38 PM By: Baruch Gouty RN, BSN Entered By: Baruch Gouty on 11/15/2022 14:22:06 -------------------------------------------------------------------------------- Multi Wound Chart Details Patient Name: Date of Service: [name]. 11/15/2022 2:00 PM Medical Record Number: [id number] Patient Account Number: [id number] Date of Birth/Sex: Treating RN: 1951/01/25 (72 y.o. F) Primary Care Symeon Puleo: Leretha Pol Other Clinician: Referring Tabria Steines: Treating Peter Keyworth/Extender: Olin Pia in Treatment: 9 Vital  Signs Height(in): 62 Pulse(bpm): 72 Weight(lbs): 207 Blood Pressure(mmHg): 126/61 Body Mass Index(BMI): 37.9 Temperature(F): 98.6 Respiratory Rate(breaths/min): 18 [1:Photos:] [Marisa/A:Marisa/A] Right Gluteus Right Calcaneus Marisa/A Wound Location: Pressure Injury Pressure Injury Marisa/A Wounding Event: Pressure Ulcer Pressure Ulcer Marisa/A Primary Etiology: Chronic sinus problems/congestion, Chronic sinus problems/congestion, Marisa/A Comorbid History: Sleep Apnea, Congestive Heart Sleep Apnea, Congestive Heart Failure, Raynauds, Rheumatoid Failure, Raynauds, Rheumatoid Arthritis, Neuropathy Arthritis, Neuropathy 08/11/2022 09/21/2022 Marisa/A Date Acquired: 9 6 Marisa/A Weeks of Treatment: Open Open Marisa/A Wound Status: No No Marisa/A Wound Recurrence: 2.5x4x6.2 2.5x4.2x0.1 Marisa/A Measurements L x W x D (cm) 7.854 8.247 Marisa/A A (cm) : rea [id number]  0.825 Marisa/A Volume (cm) : 64.30% 14.60% Marisa/A % Reduction in A rea: 63.10% 14.60% Marisa/A % Reduction in Volume: 1 Starting Position 1 (o'clock): 3 Ending Position 1 (o'clock): 10.2 Maximum Distance 1 (cm): Yes No Marisa/A Undermining: Category/Stage IV Unstageable/Unclassified Marisa/A Classification: Large Small Marisa/A Exudate A mount: Serosanguineous Serosanguineous Marisa/A Exudate Type: red, brown red, brown Marisa/A Exudate Color: Well defined, not attached Flat and Intact Marisa/A Wound MarginNAYALI, Cooper ([id number]) [id number] Page 3 of 9 Large (67-100%) Small (1-33%) Marisa/A Granulation Amount: Red Pink Marisa/A Granulation Quality: None Present (0%) Large (67-100%) Marisa/A Necrotic Amount: Marisa/A Eschar Marisa/A Necrotic Tissue: Fat Layer (Subcutaneous Tissue): Yes Fat Layer (Subcutaneous Tissue): Yes Marisa/A Exposed Structures: Fascia: No Fascia: No Tendon: No Tendon: No Muscle: No Muscle: No Joint: No Joint: No Bone: No Bone: No None Small (1-33%) Marisa/A Epithelialization: Marisa/A Debridement - Excisional Marisa/A Debridement: Pre-procedure Verification/Time Out Marisa/A  14:40 Marisa/A Taken: Marisa/A Lidocaine 4% T opical Solution Marisa/A Pain Control: Marisa/A Necrotic/Eschar, Subcutaneous, Marisa/A Tissue Debrided: Slough Marisa/A Skin/Subcutaneous Tissue Marisa/A Level: Marisa/A 10.5 Marisa/A Debridement A (sq cm): rea Marisa/A Blade, Curette, Forceps Marisa/A Instrument: Marisa/A Minimum Marisa/A Bleeding: Marisa/A Pressure Marisa/A Hemostasis Achieved: Marisa/A 3 Marisa/A Procedural Pain: Marisa/A 1 Marisa/A Post Procedural Pain: Marisa/A Procedure was tolerated well Marisa/A Debridement Treatment Response: Marisa/A 2.5x4.2x0.3 Marisa/A Post Debridement Measurements L x W x D (cm) Marisa/A 2.474 Marisa/A Post Debridement Volume: (cm) Marisa/A Category/Stage III Marisa/A Post Debridement Stage: Excoriation: No No Abnormalities Noted Marisa/A Periwound Skin Texture: Induration: No Callus: No Crepitus: No Rash: No Scarring: No Maceration: No Dry/Scaly: Yes Marisa/A Periwound Skin Moisture: Dry/Scaly: No Maceration: No Atrophie Blanche: No No Abnormalities Noted Marisa/A Periwound Skin Color: Cyanosis: No Ecchymosis: No Erythema: No Hemosiderin Staining: No Mottled: No Pallor: No Rubor: No No Abnormality No Abnormality Marisa/A Temperature: Yes Yes Marisa/A Tenderness on Palpation: Negative Pressure Wound Therapy Debridement Marisa/A Procedures Performed: Maintenance (NPWT) Treatment Notes Electronic Signature(s) Signed: 11/15/2022 3:12:08 PM By: Fredirick Maudlin MD FACS Entered By: Fredirick Maudlin on 11/15/2022 15:12:08 -------------------------------------------------------------------------------- Multi-Disciplinary Care Plan Details Patient Name: Date of Service: [name]. 11/15/2022 2:00 PM Medical Record Number: [id number] Patient Account Number: [id number] Date of Birth/Sex: Treating RN: 08/28/1950 (72 y.o. [name] Primary Care Elizabelle Fite: Leretha Pol Other Clinician: Referring Devonda Pequignot: Treating Antoino Westhoff/Extender: Olin Pia in Treatment: [place] reviewed with physician Active  Inactive Pressure Nursing Diagnoses: Knowledge deficit related to management of pressures ulcers Goals: Patient will remain free of pressure ulcers [name], [name] ([id number]) [id number] Page 4 of 9 Date Initiated: 09/15/2022 Date Inactivated: 09/28/2022 Target Resolution Date: 11/09/2022 Unmet Reason: new pressure ulcer Goal Status: Unmet right heel Patient/caregiver will verbalize risk factors for pressure ulcer development Date Initiated: 09/15/2022 Target Resolution Date: 01/12/2023 Goal Status: Active Interventions: Assess potential for pressure ulcer upon admission and as needed Treatment Activities: Patient referred for pressure reduction/relief devices : 09/15/2022 Pressure reduction/relief device ordered : 09/15/2022 Notes: Wound/Skin Impairment Nursing Diagnoses: Impaired tissue integrity Knowledge deficit related to ulceration/compromised skin integrity Goals: Patient/caregiver will verbalize understanding of skin care regimen Date Initiated: 09/28/2022 Target Resolution Date: 12/07/2022 Goal Status: Active Ulcer/skin breakdown will have a volume reduction of 50% by week 8 Date Initiated: 09/28/2022 Date Inactivated: 11/01/2022 Target Resolution Date: 11/02/2022 Goal Status: Met Ulcer/skin breakdown will have a volume reduction of 80% by week 12 Date Initiated: 11/01/2022 Target Resolution Date: 11/29/2022 Goal Status: Active Interventions: Assess patient/caregiver ability to obtain necessary supplies Assess patient/caregiver ability to perform ulcer/skin care  regimen upon admission and as needed Assess ulceration(s) every visit Provide education on ulcer and skin care Treatment Activities: Skin care regimen initiated : 09/28/2022 Topical wound management initiated : 09/28/2022 Notes: Electronic Signature(s) Signed: 11/15/2022 6:19:38 PM By: Baruch Gouty RN, BSN Entered By: Baruch Gouty on 11/15/2022  14:35:48 -------------------------------------------------------------------------------- Negative Pressure Wound Therapy Maintenance (NPWT) Details Patient Name: Date of Service: [name], [name] 11/15/2022 2:00 PM Medical Record Number: [id number] Patient Account Number: [id number] Date of Birth/Sex: Treating RN: Mar 20, 1951 (72 y.o. [name] Primary Care Tyechia Allmendinger: Leretha Pol Other Clinician: Referring Trevyn Lumpkin: Treating Priseis Cratty/Extender: Olin Pia in Treatment: 9 NPWT Maintenance Performed for: Wound #1 Right Gluteus Additional Injuries Covered: No Performed By: Baruch Gouty, RN Type: VAC System Coverage Size (sq cm): 10 Pressure Type: Constant Pressure Setting: 125 mmHG Drain Type: None Primary Contact: Silver Sponge/Dressing Type: Foam- Black Date Initiated: 10/16/2022 Dressing Removed: Yes Quantity of Sponges/Gauze Removed: 1 Canister Changed: No Canister Exudate Volume: 100 Dressing ReappliedNYIA, [name] ([id number]) [id number] Page 5 of 9 Quantity of Sponges/Gauze Inserted: 1 Respones T Treatment: o good Days On NPWT : 31 Post Procedure Diagnosis Same as Pre-procedure Electronic Signature(s) Signed: 11/15/2022 6:19:38 PM By: Baruch Gouty RN, BSN Entered By: Baruch Gouty on 11/15/2022 14:52:52 -------------------------------------------------------------------------------- Pain Assessment Details Patient Name: Date of Service: [name]. 11/15/2022 2:00 PM Medical Record Number: [id number] Patient Account Number: [id number] Date of Birth/Sex: Treating RN: [birth year] (72 y.o. F) Primary Care Emilyrose Darrah: Leretha Pol Other Clinician: Referring Markon Jares: Treating Jaysa Kise/Extender: Olin Pia in Treatment: 9 Active Problems Location of Pain Severity and Description of Pain Patient Has Paino No Site Locations Rate the pain. Current Pain Level:  0 Pain Management and Medication Current Pain Management: Electronic Signature(s) Signed: 11/15/2022 6:19:38 PM By: Baruch Gouty RN, BSN Entered By: Baruch Gouty on 11/15/2022 14:21:11 -------------------------------------------------------------------------------- Patient/Caregiver Education Details Patient Name: Date of Service: [name] 4/3/2024andnbsp2:00 PM Medical Record Number: [id number] Patient Account Number: [id number] Date of Birth/Gender: Treating RN: 08/30/1950 (72 y.o. [name] Primary Care Physician: Leretha Pol Other Clinician: Referring Physician: Treating Physician/Extender: Olin Pia in Treatment: 9 Education Assessment Education Provided To: Patient [name], [name] ([id number]) [id number] Page 6 of 9 Education Topics Provided Pressure: Methods: Explain/Verbal Responses: Reinforcements needed, State content correctly Wound/Skin Impairment: Methods: Explain/Verbal Responses: Reinforcements needed, State content correctly Electronic Signature(s) Signed: 11/15/2022 6:19:38 PM By: Baruch Gouty RN, BSN Entered By: Baruch Gouty on 11/15/2022 14:37:29 -------------------------------------------------------------------------------- Wound Assessment Details Patient Name: Date of Service: [name]. 11/15/2022 2:00 PM Medical Record Number: [id number] Patient Account Number: [id number] Date of Birth/Sex: Treating RN: [birth year] (72 y.o. [name] Primary Care Johnavon Mcclafferty: Leretha Pol Other Clinician: Referring Jacinto Keil: Treating Seville Downs/Extender: Olin Pia in Treatment: 9 Wound Status Wound Number: 1 Primary Pressure Ulcer Etiology: Wound Location: Right Gluteus Wound Open Wounding Event: Pressure Injury Status: Date Acquired: 08/11/2022 Comorbid Chronic sinus problems/congestion, Sleep Apnea, Congestive Heart Weeks Of Treatment:  9 History: Failure, Raynauds, Rheumatoid Arthritis, Neuropathy Clustered Wound: No Photos Wound Measurements Length: (cm) 2.5 Width: (cm) 4 Depth: (cm) 6.2 Area: (cm) 7.854 Volume: (cm) 48.695 % Reduction in Area: 64.3% % Reduction in Volume: 63.1% Epithelialization: None Tunneling: No Undermining: Yes Starting Position (o'clock): 1 Ending Position (o'clock): 3 Maximum Distance: (cm) 10.2 Wound Description Classification: Category/Stage IV Wound Margin: Well defined, not attached Exudate Amount: Large Exudate Type: Serosanguineous Exudate Color: red, brown Foul Odor After Cleansing: No  Slough/Fibrino Yes Wound Bed Granulation Amount: Large (67-100%) Exposed Structure Granulation Quality: Red Fascia Exposed: No Necrotic Amount: None Present (0%) Fat Layer (Subcutaneous Tissue) Exposed: Yes Tendon Exposed: No Muscle Exposed: No Joint Exposed: No [name], [name] ([id number]) [id number] Page 7 of 9 Bone Exposed: No Periwound Skin Texture Texture Color No Abnormalities Noted: Yes No Abnormalities Noted: Yes Moisture Temperature / Pain No Abnormalities Noted: Yes Temperature: No Abnormality Tenderness on Palpation: Yes Treatment Notes Wound #1 (Gluteus) Wound Laterality: Right Cleanser Wound Cleanser Discharge Instruction: Cleanse the wound with wound cleanser prior to applying a clean dressing using gauze sponges, not tissue or cotton balls. Peri-Wound Care Topical Primary Dressing Promogran Prisma Matrix, 4.34 (sq in) (silver collagen) Discharge Instruction: Moisten collagen with saline or hydrogel and place at base of wound under VAC sponge VAC Secondary Dressing Secured With Compression Wrap Compression Stockings Add-Ons Electronic Signature(s) Signed: 11/15/2022 6:19:38 PM By: Baruch Gouty RN, BSN Entered By: Baruch Gouty on 11/15/2022  14:31:55 -------------------------------------------------------------------------------- Wound Assessment Details Patient Name: Date of Service: [name]. 11/15/2022 2:00 PM Medical Record Number: [id number] Patient Account Number: [id number] Date of Birth/Sex: Treating RN: [birth year] (72 y.o. [name] Primary Care Lisbet Busker: Leretha Pol Other Clinician: Referring Janet Humphreys: Treating Mersadez Linden/Extender: Olin Pia in Treatment: 9 Wound Status Wound Number: 3 Primary Pressure Ulcer Etiology: Wound Location: Right Calcaneus Wound Open Wounding Event: Pressure Injury Status: Date Acquired: 09/21/2022 Comorbid Chronic sinus problems/congestion, Sleep Apnea, Congestive Heart Weeks Of Treatment: 6 History: Failure, Raynauds, Rheumatoid Arthritis, Neuropathy Clustered Wound: No Photos Wound Measurements [name], [name] ([id number]) Length: (cm) 2.5 Width: (cm) 4.2 Depth: (cm) 0.1 Area: (cm) 8.247 Volume: (cm) 0.825 [id number] Page 8 of 9 % Reduction in Area: 14.6% % Reduction in Volume: 14.6% Epithelialization: Small (1-33%) Tunneling: No Undermining: No Wound Description Classification: Unstageable/Unclassified Wound Margin: Flat and Intact Exudate Amount: Small Exudate Type: Serosanguineous Exudate Color: red, brown Foul Odor After Cleansing: No Slough/Fibrino Yes Wound Bed Granulation Amount: Small (1-33%) Exposed Structure Granulation Quality: Pink Fascia Exposed: No Necrotic Amount: Large (67-100%) Fat Layer (Subcutaneous Tissue) Exposed: Yes Necrotic Quality: Eschar Tendon Exposed: No Muscle Exposed: No Joint Exposed: No Bone Exposed: No Periwound Skin Texture Texture Color No Abnormalities Noted: Yes No Abnormalities Noted: Yes Moisture Temperature / Pain No Abnormalities Noted: No Temperature: No Abnormality Dry / Scaly: Yes Tenderness on Palpation: Yes Maceration: No Treatment  Notes Wound #3 (Calcaneus) Wound Laterality: Right Cleanser Peri-Wound Care Topical Primary Dressing Maxorb Extra Ag+ Alginate Dressing, 2x2 (in/in) Discharge Instruction: Apply to pink area of wound bed Secondary Dressing ALLEVYN Heel 4 1/2in x 5 1/2in / 10.5cm x 13.5cm Discharge Instruction: Apply over primary dressing as directed. Woven Gauze Sponge, Non-Sterile 4x4 in Discharge Instruction: Apply over primary dressing as directed. Secured With [employer], 4.5x3.1 (in/yd) Discharge Instruction: Secure with Kerlix as directed. Paper Tape, 2x10 (in/yd) Discharge Instruction: Secure dressing with tape as directed. Compression Wrap Compression Stockings Add-Ons Electronic Signature(s) Signed: 11/15/2022 6:19:38 PM By: Baruch Gouty RN, BSN Entered By: Baruch Gouty on 11/15/2022 14:27:03 -------------------------------------------------------------------------------- Vitals Details Patient Name: Date of Service: [name]. 11/15/2022 2:00 PM Medical Record Number: [id number] Patient Account Number: [id number] Date of Birth/Sex: Treating RN: 1951/04/01 (72 y.o. [name], [name] ([id number]) [id number] Page 9 of 9 Primary Care Corday Wyka: Leretha Pol Other Clinician: Referring [name] Clayson: Treating Ellanore Vanhook/Extender: Olin Pia in Treatment: 9 Vital Signs Time Taken: 14:17 Temperature (F): 98.6 Height (in): 62 Pulse (bpm): 72 Weight (lbs):  207 Respiratory Rate (breaths/min): 18 Body Mass Index (BMI): 37.9 Blood Pressure (mmHg): 126/61 Reference Range: 80 - 120 mg / dl Electronic Signature(s) Signed: 11/16/2022 11:27:10 AM By: Sandre Kitty Entered By: Sandre Kitty on 11/15/2022 14:18:52

## 2022-11-17 DIAGNOSIS — N1832 Chronic kidney disease, stage 3b: Secondary | ICD-10-CM | POA: Diagnosis not present

## 2022-11-17 DIAGNOSIS — I130 Hypertensive heart and chronic kidney disease with heart failure and stage 1 through stage 4 chronic kidney disease, or unspecified chronic kidney disease: Secondary | ICD-10-CM | POA: Diagnosis not present

## 2022-11-17 DIAGNOSIS — K219 Gastro-esophageal reflux disease without esophagitis: Secondary | ICD-10-CM | POA: Diagnosis not present

## 2022-11-17 DIAGNOSIS — Z7985 Long-term (current) use of injectable non-insulin antidiabetic drugs: Secondary | ICD-10-CM | POA: Diagnosis not present

## 2022-11-17 DIAGNOSIS — J329 Chronic sinusitis, unspecified: Secondary | ICD-10-CM | POA: Diagnosis not present

## 2022-11-17 DIAGNOSIS — M545 Low back pain, unspecified: Secondary | ICD-10-CM | POA: Diagnosis not present

## 2022-11-17 DIAGNOSIS — M80051D Age-related osteoporosis with current pathological fracture, right femur, subsequent encounter for fracture with routine healing: Secondary | ICD-10-CM | POA: Diagnosis not present

## 2022-11-17 DIAGNOSIS — Z9181 History of falling: Secondary | ICD-10-CM | POA: Diagnosis not present

## 2022-11-17 DIAGNOSIS — G4733 Obstructive sleep apnea (adult) (pediatric): Secondary | ICD-10-CM | POA: Diagnosis not present

## 2022-11-17 DIAGNOSIS — M797 Fibromyalgia: Secondary | ICD-10-CM | POA: Diagnosis not present

## 2022-11-17 DIAGNOSIS — Z7982 Long term (current) use of aspirin: Secondary | ICD-10-CM | POA: Diagnosis not present

## 2022-11-17 DIAGNOSIS — I5032 Chronic diastolic (congestive) heart failure: Secondary | ICD-10-CM | POA: Diagnosis not present

## 2022-11-17 DIAGNOSIS — L89612 Pressure ulcer of right heel, stage 2: Secondary | ICD-10-CM | POA: Diagnosis not present

## 2022-11-17 DIAGNOSIS — M069 Rheumatoid arthritis, unspecified: Secondary | ICD-10-CM | POA: Diagnosis not present

## 2022-11-17 DIAGNOSIS — L89310 Pressure ulcer of right buttock, unstageable: Secondary | ICD-10-CM | POA: Diagnosis not present

## 2022-11-17 DIAGNOSIS — E1122 Type 2 diabetes mellitus with diabetic chronic kidney disease: Secondary | ICD-10-CM | POA: Diagnosis not present

## 2022-11-17 DIAGNOSIS — M3500 Sicca syndrome, unspecified: Secondary | ICD-10-CM | POA: Diagnosis not present

## 2022-11-17 DIAGNOSIS — M9701XD Periprosthetic fracture around internal prosthetic right hip joint, subsequent encounter: Secondary | ICD-10-CM | POA: Diagnosis not present

## 2022-11-17 DIAGNOSIS — G8929 Other chronic pain: Secondary | ICD-10-CM | POA: Diagnosis not present

## 2022-11-21 DIAGNOSIS — L89612 Pressure ulcer of right heel, stage 2: Secondary | ICD-10-CM | POA: Diagnosis not present

## 2022-11-21 DIAGNOSIS — G4733 Obstructive sleep apnea (adult) (pediatric): Secondary | ICD-10-CM | POA: Diagnosis not present

## 2022-11-21 DIAGNOSIS — M9701XD Periprosthetic fracture around internal prosthetic right hip joint, subsequent encounter: Secondary | ICD-10-CM | POA: Diagnosis not present

## 2022-11-21 DIAGNOSIS — M797 Fibromyalgia: Secondary | ICD-10-CM | POA: Diagnosis not present

## 2022-11-21 DIAGNOSIS — K219 Gastro-esophageal reflux disease without esophagitis: Secondary | ICD-10-CM | POA: Diagnosis not present

## 2022-11-21 DIAGNOSIS — L89310 Pressure ulcer of right buttock, unstageable: Secondary | ICD-10-CM | POA: Diagnosis not present

## 2022-11-21 DIAGNOSIS — Z7985 Long-term (current) use of injectable non-insulin antidiabetic drugs: Secondary | ICD-10-CM | POA: Diagnosis not present

## 2022-11-21 DIAGNOSIS — E1122 Type 2 diabetes mellitus with diabetic chronic kidney disease: Secondary | ICD-10-CM | POA: Diagnosis not present

## 2022-11-21 DIAGNOSIS — Z9181 History of falling: Secondary | ICD-10-CM | POA: Diagnosis not present

## 2022-11-21 DIAGNOSIS — M80051D Age-related osteoporosis with current pathological fracture, right femur, subsequent encounter for fracture with routine healing: Secondary | ICD-10-CM | POA: Diagnosis not present

## 2022-11-21 DIAGNOSIS — Z7982 Long term (current) use of aspirin: Secondary | ICD-10-CM | POA: Diagnosis not present

## 2022-11-21 DIAGNOSIS — N1832 Chronic kidney disease, stage 3b: Secondary | ICD-10-CM | POA: Diagnosis not present

## 2022-11-21 DIAGNOSIS — J329 Chronic sinusitis, unspecified: Secondary | ICD-10-CM | POA: Diagnosis not present

## 2022-11-21 DIAGNOSIS — M3500 Sicca syndrome, unspecified: Secondary | ICD-10-CM | POA: Diagnosis not present

## 2022-11-21 DIAGNOSIS — I130 Hypertensive heart and chronic kidney disease with heart failure and stage 1 through stage 4 chronic kidney disease, or unspecified chronic kidney disease: Secondary | ICD-10-CM | POA: Diagnosis not present

## 2022-11-21 DIAGNOSIS — M545 Low back pain, unspecified: Secondary | ICD-10-CM | POA: Diagnosis not present

## 2022-11-21 DIAGNOSIS — M069 Rheumatoid arthritis, unspecified: Secondary | ICD-10-CM | POA: Diagnosis not present

## 2022-11-21 DIAGNOSIS — I5032 Chronic diastolic (congestive) heart failure: Secondary | ICD-10-CM | POA: Diagnosis not present

## 2022-11-21 DIAGNOSIS — G8929 Other chronic pain: Secondary | ICD-10-CM | POA: Diagnosis not present

## 2022-11-24 DIAGNOSIS — I130 Hypertensive heart and chronic kidney disease with heart failure and stage 1 through stage 4 chronic kidney disease, or unspecified chronic kidney disease: Secondary | ICD-10-CM | POA: Diagnosis not present

## 2022-11-24 DIAGNOSIS — Z7982 Long term (current) use of aspirin: Secondary | ICD-10-CM | POA: Diagnosis not present

## 2022-11-24 DIAGNOSIS — M069 Rheumatoid arthritis, unspecified: Secondary | ICD-10-CM | POA: Diagnosis not present

## 2022-11-24 DIAGNOSIS — M3500 Sicca syndrome, unspecified: Secondary | ICD-10-CM | POA: Diagnosis not present

## 2022-11-24 DIAGNOSIS — Z7985 Long-term (current) use of injectable non-insulin antidiabetic drugs: Secondary | ICD-10-CM | POA: Diagnosis not present

## 2022-11-24 DIAGNOSIS — N1832 Chronic kidney disease, stage 3b: Secondary | ICD-10-CM | POA: Diagnosis not present

## 2022-11-24 DIAGNOSIS — G8929 Other chronic pain: Secondary | ICD-10-CM | POA: Diagnosis not present

## 2022-11-24 DIAGNOSIS — M9701XD Periprosthetic fracture around internal prosthetic right hip joint, subsequent encounter: Secondary | ICD-10-CM | POA: Diagnosis not present

## 2022-11-24 DIAGNOSIS — E1122 Type 2 diabetes mellitus with diabetic chronic kidney disease: Secondary | ICD-10-CM | POA: Diagnosis not present

## 2022-11-24 DIAGNOSIS — M797 Fibromyalgia: Secondary | ICD-10-CM | POA: Diagnosis not present

## 2022-11-24 DIAGNOSIS — G4733 Obstructive sleep apnea (adult) (pediatric): Secondary | ICD-10-CM | POA: Diagnosis not present

## 2022-11-24 DIAGNOSIS — M80051D Age-related osteoporosis with current pathological fracture, right femur, subsequent encounter for fracture with routine healing: Secondary | ICD-10-CM | POA: Diagnosis not present

## 2022-11-24 DIAGNOSIS — L89612 Pressure ulcer of right heel, stage 2: Secondary | ICD-10-CM | POA: Diagnosis not present

## 2022-11-24 DIAGNOSIS — L89310 Pressure ulcer of right buttock, unstageable: Secondary | ICD-10-CM | POA: Diagnosis not present

## 2022-11-24 DIAGNOSIS — J329 Chronic sinusitis, unspecified: Secondary | ICD-10-CM | POA: Diagnosis not present

## 2022-11-24 DIAGNOSIS — I5032 Chronic diastolic (congestive) heart failure: Secondary | ICD-10-CM | POA: Diagnosis not present

## 2022-11-24 DIAGNOSIS — K219 Gastro-esophageal reflux disease without esophagitis: Secondary | ICD-10-CM | POA: Diagnosis not present

## 2022-11-24 DIAGNOSIS — Z9181 History of falling: Secondary | ICD-10-CM | POA: Diagnosis not present

## 2022-11-24 DIAGNOSIS — M545 Low back pain, unspecified: Secondary | ICD-10-CM | POA: Diagnosis not present

## 2022-11-26 DIAGNOSIS — R269 Unspecified abnormalities of gait and mobility: Secondary | ICD-10-CM | POA: Diagnosis not present

## 2022-11-26 DIAGNOSIS — G4733 Obstructive sleep apnea (adult) (pediatric): Secondary | ICD-10-CM | POA: Diagnosis not present

## 2022-11-27 DIAGNOSIS — G4733 Obstructive sleep apnea (adult) (pediatric): Secondary | ICD-10-CM | POA: Diagnosis not present

## 2022-11-27 DIAGNOSIS — L89612 Pressure ulcer of right heel, stage 2: Secondary | ICD-10-CM | POA: Diagnosis not present

## 2022-11-27 DIAGNOSIS — M545 Low back pain, unspecified: Secondary | ICD-10-CM | POA: Diagnosis not present

## 2022-11-27 DIAGNOSIS — E1122 Type 2 diabetes mellitus with diabetic chronic kidney disease: Secondary | ICD-10-CM | POA: Diagnosis not present

## 2022-11-27 DIAGNOSIS — Z9181 History of falling: Secondary | ICD-10-CM | POA: Diagnosis not present

## 2022-11-27 DIAGNOSIS — Z7985 Long-term (current) use of injectable non-insulin antidiabetic drugs: Secondary | ICD-10-CM | POA: Diagnosis not present

## 2022-11-27 DIAGNOSIS — I130 Hypertensive heart and chronic kidney disease with heart failure and stage 1 through stage 4 chronic kidney disease, or unspecified chronic kidney disease: Secondary | ICD-10-CM | POA: Diagnosis not present

## 2022-11-27 DIAGNOSIS — M797 Fibromyalgia: Secondary | ICD-10-CM | POA: Diagnosis not present

## 2022-11-27 DIAGNOSIS — M3500 Sicca syndrome, unspecified: Secondary | ICD-10-CM | POA: Diagnosis not present

## 2022-11-27 DIAGNOSIS — M80051D Age-related osteoporosis with current pathological fracture, right femur, subsequent encounter for fracture with routine healing: Secondary | ICD-10-CM | POA: Diagnosis not present

## 2022-11-27 DIAGNOSIS — N1832 Chronic kidney disease, stage 3b: Secondary | ICD-10-CM | POA: Diagnosis not present

## 2022-11-27 DIAGNOSIS — M069 Rheumatoid arthritis, unspecified: Secondary | ICD-10-CM | POA: Diagnosis not present

## 2022-11-27 DIAGNOSIS — J329 Chronic sinusitis, unspecified: Secondary | ICD-10-CM | POA: Diagnosis not present

## 2022-11-27 DIAGNOSIS — Z7982 Long term (current) use of aspirin: Secondary | ICD-10-CM | POA: Diagnosis not present

## 2022-11-27 DIAGNOSIS — L89310 Pressure ulcer of right buttock, unstageable: Secondary | ICD-10-CM | POA: Diagnosis not present

## 2022-11-27 DIAGNOSIS — I5032 Chronic diastolic (congestive) heart failure: Secondary | ICD-10-CM | POA: Diagnosis not present

## 2022-11-27 DIAGNOSIS — M9701XD Periprosthetic fracture around internal prosthetic right hip joint, subsequent encounter: Secondary | ICD-10-CM | POA: Diagnosis not present

## 2022-11-27 DIAGNOSIS — K219 Gastro-esophageal reflux disease without esophagitis: Secondary | ICD-10-CM | POA: Diagnosis not present

## 2022-11-27 DIAGNOSIS — G8929 Other chronic pain: Secondary | ICD-10-CM | POA: Diagnosis not present

## 2022-11-28 ENCOUNTER — Ambulatory Visit: Payer: PPO | Attending: Rheumatology | Admitting: Rheumatology

## 2022-11-28 ENCOUNTER — Encounter: Payer: Self-pay | Admitting: Rheumatology

## 2022-11-28 ENCOUNTER — Telehealth: Payer: Self-pay

## 2022-11-28 VITALS — BP 104/66 | HR 72 | Resp 17 | Ht 62.0 in | Wt 212.0 lb

## 2022-11-28 DIAGNOSIS — Z79899 Other long term (current) drug therapy: Secondary | ICD-10-CM

## 2022-11-28 DIAGNOSIS — M51369 Other intervertebral disc degeneration, lumbar region without mention of lumbar back pain or lower extremity pain: Secondary | ICD-10-CM

## 2022-11-28 DIAGNOSIS — M0579 Rheumatoid arthritis with rheumatoid factor of multiple sites without organ or systems involvement: Secondary | ICD-10-CM

## 2022-11-28 DIAGNOSIS — M5136 Other intervertebral disc degeneration, lumbar region: Secondary | ICD-10-CM

## 2022-11-28 DIAGNOSIS — G8929 Other chronic pain: Secondary | ICD-10-CM

## 2022-11-28 DIAGNOSIS — N184 Chronic kidney disease, stage 4 (severe): Secondary | ICD-10-CM

## 2022-11-28 DIAGNOSIS — L89616 Pressure-induced deep tissue damage of right heel: Secondary | ICD-10-CM

## 2022-11-28 DIAGNOSIS — M533 Sacrococcygeal disorders, not elsewhere classified: Secondary | ICD-10-CM | POA: Diagnosis not present

## 2022-11-28 DIAGNOSIS — Z8679 Personal history of other diseases of the circulatory system: Secondary | ICD-10-CM

## 2022-11-28 DIAGNOSIS — M1712 Unilateral primary osteoarthritis, left knee: Secondary | ICD-10-CM | POA: Diagnosis not present

## 2022-11-28 DIAGNOSIS — M3501 Sicca syndrome with keratoconjunctivitis: Secondary | ICD-10-CM

## 2022-11-28 DIAGNOSIS — M810 Age-related osteoporosis without current pathological fracture: Secondary | ICD-10-CM

## 2022-11-28 DIAGNOSIS — L89314 Pressure ulcer of right buttock, stage 4: Secondary | ICD-10-CM

## 2022-11-28 DIAGNOSIS — M3500 Sicca syndrome, unspecified: Secondary | ICD-10-CM

## 2022-11-28 DIAGNOSIS — I7300 Raynaud's syndrome without gangrene: Secondary | ICD-10-CM

## 2022-11-28 DIAGNOSIS — M25511 Pain in right shoulder: Secondary | ICD-10-CM | POA: Diagnosis not present

## 2022-11-28 DIAGNOSIS — Z8719 Personal history of other diseases of the digestive system: Secondary | ICD-10-CM

## 2022-11-28 DIAGNOSIS — M797 Fibromyalgia: Secondary | ICD-10-CM

## 2022-11-28 DIAGNOSIS — Z96651 Presence of right artificial knee joint: Secondary | ICD-10-CM

## 2022-11-28 DIAGNOSIS — Z8639 Personal history of other endocrine, nutritional and metabolic disease: Secondary | ICD-10-CM

## 2022-11-28 DIAGNOSIS — Z8781 Personal history of (healed) traumatic fracture: Secondary | ICD-10-CM

## 2022-11-28 DIAGNOSIS — Z8669 Personal history of other diseases of the nervous system and sense organs: Secondary | ICD-10-CM

## 2022-11-28 DIAGNOSIS — Z8659 Personal history of other mental and behavioral disorders: Secondary | ICD-10-CM

## 2022-11-28 DIAGNOSIS — K200 Eosinophilic esophagitis: Secondary | ICD-10-CM

## 2022-11-28 NOTE — Telephone Encounter (Signed)
[name] was calling to get VO on pt HH PT 1 time a week for 4 weeks.   Verbal was given will faxed written order

## 2022-11-29 ENCOUNTER — Telehealth: Payer: Self-pay | Admitting: *Deleted

## 2022-11-29 ENCOUNTER — Encounter (HOSPITAL_BASED_OUTPATIENT_CLINIC_OR_DEPARTMENT_OTHER): Payer: PPO | Admitting: General Surgery

## 2022-11-29 DIAGNOSIS — L89314 Pressure ulcer of right buttock, stage 4: Secondary | ICD-10-CM | POA: Diagnosis not present

## 2022-11-29 DIAGNOSIS — L89613 Pressure ulcer of right heel, stage 3: Secondary | ICD-10-CM | POA: Diagnosis not present

## 2022-11-29 LAB — COMPLETE METABOLIC PANEL WITH GFR
AG Ratio: 1.5 (calc) (ref 1.0–2.5)
ALT: 6 U/L (ref 6–29)
AST: 14 U/L (ref 10–35)
Albumin: 3.4 g/dL — ABNORMAL LOW (ref 3.6–5.1)
Alkaline phosphatase (APISO): 82 U/L (ref 37–153)
BUN/Creatinine Ratio: 18 (calc) (ref 6–22)
BUN: 24 mg/dL (ref 7–25)
CO2: 24 mmol/L (ref 20–32)
Calcium: 8.6 mg/dL (ref 8.6–10.4)
Chloride: 106 mmol/L (ref 98–110)
Creat: 1.32 mg/dL — ABNORMAL HIGH (ref 0.60–1.00)
Globulin: 2.3 g/dL (calc) (ref 1.9–3.7)
Glucose, Bld: 109 mg/dL (ref 65–139)
Potassium: 3.9 mmol/L (ref 3.5–5.3)
Sodium: 140 mmol/L (ref 135–146)
Total Bilirubin: 0.3 mg/dL (ref 0.2–1.2)
Total Protein: 5.7 g/dL — ABNORMAL LOW (ref 6.1–8.1)
eGFR: 43 mL/min/{1.73_m2} — ABNORMAL LOW (ref >=60)

## 2022-11-29 LAB — CBC WITH DIFFERENTIAL/PLATELET
Absolute Monocytes: 811 cells/uL (ref 200–950)
Basophils Absolute: 121 cells/uL (ref 0–200)
Basophils Relative: 1.8 %
Eosinophils Absolute: 630 cells/uL — ABNORMAL HIGH (ref 15–500)
Eosinophils Relative: 9.4 %
HCT: 28.6 % — ABNORMAL LOW (ref 35.0–45.0)
Hemoglobin: 9.1 g/dL — ABNORMAL LOW (ref 11.7–15.5)
Lymphs Abs: 1106 cells/uL (ref 850–3900)
MCH: 27.6 pg (ref 27.0–33.0)
MCHC: 31.8 g/dL — ABNORMAL LOW (ref 32.0–36.0)
MCV: 86.7 fL (ref 80.0–100.0)
MPV: 11.2 fL (ref 7.5–12.5)
Monocytes Relative: 12.1 %
Neutro Abs: 4033 cells/uL (ref 1500–7800)
Neutrophils Relative %: 60.2 %
Platelets: 314 10*3/uL (ref 140–400)
RBC: 3.3 10*6/uL — ABNORMAL LOW (ref 3.80–5.10)
RDW: 13.7 % (ref 11.0–15.0)
Total Lymphocyte: 16.5 %
WBC: 6.7 10*3/uL (ref 3.8–10.8)

## 2022-11-29 NOTE — Telephone Encounter (Signed)
Okay to give handicap placard  

## 2022-11-29 NOTE — Telephone Encounter (Signed)
Patient is requesting a handicap placard. If approved she would like it mailed to her. Please advise.

## 2022-11-29 NOTE — Progress Notes (Signed)
Discuss labs with patient during visit 12/29/2022

## 2022-11-29 NOTE — Telephone Encounter (Signed)
Patient advised we have completed the handicap placard and are mailing it out to her. Advised patient it may take a week to get to her. Patient expressed understanding.

## 2022-11-29 NOTE — Progress Notes (Signed)
Hemoglobin is low at 9.1 creatinine is elevated at 1.32 please forward results to her PCP, wound care specialist and nephrologist.

## 2022-11-30 NOTE — Progress Notes (Signed)
[name], [name] ([id number]) [id number] Page 1 of 10 Visit Report for 11/29/2022 Chief Complaint Document Details Patient Name: Date of Service: [name], [name] 11/29/2022 2:00 PM Medical Record Number: [id number] Patient Account Number: [id number] Date of Birth/Sex: Treating RN: 1951/06/17 (72 y.o. F) Primary Care Provider: Vincent Gros Other Clinician: Referring Provider: Treating Provider/Extender: Greer Pickerel in Treatment: 11 Information Obtained from: Patient Chief Complaint Patient is at the clinic for treatment of open pressure ulcers Electronic Signature(s) Signed: 11/29/2022 2:57:11 PM By: Duanne Guess MD FACS Entered By: Duanne Guess on 11/29/2022 14:57:10 -------------------------------------------------------------------------------- Debridement Details Patient Name: Date of Service: [name]. 11/29/2022 2:00 PM Medical Record Number: [id number] Patient Account Number: [id number] Date of Birth/Sex: Treating RN: 1951/07/14 (72 y.o. [name], [name] Primary Care Provider: Vincent Gros Other Clinician: Referring Provider: Treating Provider/Extender: Greer Pickerel in Treatment: 11 Debridement Performed for Assessment: Wound #3 Right Calcaneus Performed By: Physician Duanne Guess, MD Debridement Type: Debridement Level of Consciousness (Pre-procedure): Awake and Alert Pre-procedure Verification/Time Out Yes - 14:45 Taken: Start Time: 14:45 Pain Control: Lidocaine 5% topical ointment T Area Debrided (L x W): otal 2.7 (cm) x 4.2 (cm) = 11.34 (cm) Tissue and other material debrided: Viable, Non-Viable, Slough, Subcutaneous, Slough Level: Skin/Subcutaneous Tissue Debridement Description: Excisional Instrument: Curette Bleeding: Minimum Hemostasis Achieved: Pressure Procedural Pain: 3 Post Procedural Pain: 1 Response to Treatment: Procedure was tolerated well Level  of Consciousness (Post- Awake and Alert procedure): Post Debridement Measurements of Total Wound Length: (cm) 2.7 Stage: Category/Stage III Width: (cm) 4.2 Depth: (cm) 0.4 Volume: (cm) 3.563 Character of Wound/Ulcer Post Debridement: Requires Further Debridement Post Procedure Diagnosis Same as Pre-procedure Notes Scribed for Dr. Lady Gary by Zenaida Deed, RN Electronic Signature(s) Signed: 11/29/2022 3:59:24 PM By: Duanne Guess MD FACS Signed: 11/29/2022 5:05:24 PM By: Zenaida Deed RN, BSN Entered By: Zenaida Deed on 11/29/2022 14:48:40 [name] ([id number]) [id number] Page 2 of 10 -------------------------------------------------------------------------------- HPI Details Patient Name: Date of Service: [name] 11/29/2022 2:00 PM Medical Record Number: [id number] Patient Account Number: [id number] Date of Birth/Sex: Treating RN: 06/19/1951 (72 y.o. F) Primary Care Provider: Vincent Gros Other Clinician: Referring Provider: Treating Provider/Extender: Greer Pickerel in Treatment: 11 History of Present Illness HPI Description: ADMISSION 09/07/2023 This is a 72 year old woman with a past medical history notable for obesity, congestive heart failure, Raynaud's syndrome, CKD stage IIIb, osteoporosis, and rheumatoid arthritis. In November 2023, she suffered a fall that resulted in a femur fracture. She was hospitalized for about a week and underwent surgical repair of the fracture. She subsequently developed pressure ulcers on her right buttocks and ischium. She has been receiving home health services and they have been applying Medihoney. They have been reporting the wounds as stage II, but on evaluation, the large ulcer was probably unstageable at the time they were evaluating it but it is clearly a stage IV at this point. The smaller ulcer does have fat layer exposure and therefore is a stage III. The  patient is accompanied by her daughter. She says she has been sleeping on a regular bed but recently ordered an air mattress T opper, but does not have it yet. She is on Ozempic for weight loss and therefore has a poor appetite and struggles to get adequate protein intake. 09/15/2022: The large stage IV ulcer is substantially cleaner this week. The stage III ulcer is a little smaller. Both have slough accumulation. The culture that I took last week  was polymicrobial. The Augmentin that I prescribed was adequate coverage for the species and she continues to take this. We have also ordered Keystone topical antibiotic compound, but this has not yet arrived. 09/21/2022: The stage IV ulcer has some necrotic muscle at the base but is otherwise fairly clean. The stage III ulcer is smaller again with light slough on the surface. She has her Keystone topical antibiotic compound with her today. 09/29/2022: There is still some necrotic muscle at the base of the stage IV ulcer that I was unable to get to last week. The stage III ulcer has healed. She unfortunately has developed a new pressure ulcer on her right heel. 10/06/2022: Still with some devitalized muscle at the base of the stage IV ulcer, but otherwise this wound is looking quite clean. The pressure induced tissue injury on her heel is demarcating and much of the area appears to be epithelialized, but there is still 2 areas that remain questionable. 10/12/2022: The stage IV ulcer is very clean and ready for wound VAC, which will be delivered tomorrow according to the patient. The tissue injury on her heel is drying up and does not feel particularly boggy this week. 10/19/2022: The heel injury continues to improve. There is still some dry eschar present on the more plantar aspect of it. There is some slough accumulation on the surface of the stage IV pressure ulcer. The wound VAC was initiated by home health last week. 11/01/2022: The gluteal pressure ulcer is  very clean without any necrotic tissue or slough. The depth has come in by over half a centimeter. The deep tissue injury on her heel continues to improve. There is still dry eschar that we are painting with Betadine as well as some fresh-looking viable tissue at the margin. 11/15/2022: The gluteal pressure ulcer is clean and contracting. The eschar on her heel ulcer is beginning to separate from the underlying tissue. 11/29/2022: The gluteal pressure ulcer is very clean. The depth has come in by about a centimeter. The eschar has completely separated off of her heel. There is some fibrinous exudate and slough on the heel. It has declared itself as a stage III at this point. Electronic Signature(s) Signed: 11/29/2022 2:58:21 PM By: Duanne Guess MD FACS Entered By: Duanne Guess on 11/29/2022 14:58:21 -------------------------------------------------------------------------------- Physical Exam Details Patient Name: Date of Service: Marisa Roux E. 11/29/2022 2:00 PM Medical Record Number: [id number] Patient Account Number: [id number] Date of Birth/Sex: Treating RN: 1951-01-02 (72 y.o. F) Primary Care Provider: Vincent Gros Other Clinician: Referring Provider: Treating Provider/Extender: Greer Pickerel in Treatment: 11 Constitutional . . . . no acute distress. Respiratory Normal work of breathing on room air. Notes 11/29/2022: The gluteal pressure ulcer is very clean. The depth has come in by about a centimeter. The eschar has completely separated off of her heel. There is some fibrinous exudate and slough on the heel. It has declared itself as a stage III at this point. Electronic Signature(s) KEENYA, MATERA ([id number]) [id number] Page 3 of 10 Signed: 11/29/2022 2:59:35 PM By: Duanne Guess MD FACS Entered By: Duanne Guess on 11/29/2022  14:59:34 -------------------------------------------------------------------------------- Physician Orders Details Patient Name: Date of Service: Marisa Roux E. 11/29/2022 2:00 PM Medical Record Number: [id number] Patient Account Number: [id number] Date of Birth/Sex: Treating RN: 03/27/1951 (73 y.o. [name] Primary Care Provider: Vincent Gros Other Clinician: Referring Provider: Treating Provider/Extender: Greer Pickerel in Treatment: [id number] Verbal / Phone Orders: No Diagnosis Coding ICD-10 Coding  Code Description L89.314 Pressure ulcer of right buttock, stage 4 L89.610 Pressure ulcer of right heel, unstageable I73.00 Raynaud's syndrome without gangrene I50.32 Chronic diastolic (congestive) heart failure N18.32 Chronic kidney disease, stage 3b R73.02 Impaired glucose tolerance (oral) E66.9 Obesity, unspecified Follow-up Appointments ppointment in 2 weeks. - Dr. Lady Gary RM 1 Return A Wednesday 5/1 @ 2:00 pm Anesthetic Wound #1 Right Gluteus (In clinic) Topical Lidocaine 4% applied to wound bed Wound #3 Right Calcaneus (In clinic) Topical Lidocaine 4% applied to wound bed Bathing/ Shower/ Hygiene May shower and wash wound with soap and water. Negative Presssure Wound Therapy Wound #1 Right Gluteus Wound Vac to wound continuously at 164mm/hg pressure Black Foam Off-Loading Heel suspension boot to: - globoped right foot to ambulate Low air-loss mattress (Group 2) - Adapt Turn and reposition every 2 hours - avoid lying on back, stand at least every hour while out of bed, float heels off bed with pillows under calves while in bed Prevalon Boot - right foot esp. while in bed. Additional Orders / Instructions Follow Nutritious Diet - increase protein to 70-80 gms per day, hold ozempic Juven Shake 1-2 times daily. Home Health New wound care orders this week; continue Home Health for wound care. May utilize formulary equivalent dressing for  wound treatment orders unless otherwise specified. Other Home Health Orders/Instructions: - [facility] Wound Treatment Wound #1 - Gluteus Wound Laterality: Right Cleanser: Wound Cleanser 3 x Per Week/30 Days Discharge Instructions: Cleanse the wound with wound cleanser prior to applying a clean dressing using gauze sponges, not tissue or cotton balls. Prim Dressing: Promogran Prisma Matrix, 4.34 (sq in) (silver collagen) 3 x Per Week/30 Days ary Discharge Instructions: Moisten collagen with saline or hydrogel and place at base of wound under VAC sponge Prim Dressing: VAC ary 3 x Per Week/30 Days Wound #3 - Calcaneus Wound Laterality: Right [name], [name] ([id number]) [id number] Page 4 of 10 Prim Dressing: Maxorb Extra Calcium Alginate, 2x2 (in/in) 1 x Per Day/30 Days ary Discharge Instructions: Apply to wound bed if santyl unavailable Prim Dressing: Santyl Ointment 1 x Per Day/30 Days ary Discharge Instructions: Apply nickel thick amount to wound bed as instructed Secondary Dressing: ALLEVYN Heel 4 1/2in x 5 1/2in / 10.5cm x 13.5cm 1 x Per Day/30 Days Discharge Instructions: Apply over primary dressing as directed. Secondary Dressing: Woven Gauze Sponge, Non-Sterile 4x4 in 1 x Per Day/30 Days Discharge Instructions: Apply over primary dressing as directed. Secured With: [employer], 4.5x3.1 (in/yd) 1 x Per Day/30 Days Discharge Instructions: Secure with Kerlix as directed. Secured With: Paper Tape, 2x10 (in/yd) 1 x Per Day/30 Days Discharge Instructions: Secure dressing with tape as directed. Patient Medications llergies: Sulfa (Sulfonamide Antibiotics), Cymbalta, Demerol, Iodinated Contrast Media, morphine, sulfasalazine, adhesive A Notifications Medication Indication Start End 11/29/2022 Santyl DOSE topical 250 unit/gram ointment - Apply nickel thick layer to heel ulcer with dressing changes Electronic Signature(s) Signed: 11/29/2022 3:01:46 PM  By: Duanne Guess MD FACS Entered By: Duanne Guess on 11/29/2022 15:01:45 -------------------------------------------------------------------------------- Problem List Details Patient Name: Date of Service: [name]. 11/29/2022 2:00 PM Medical Record Number: [id number] Patient Account Number: [id number] Date of Birth/Sex: Treating RN: 1951/02/05 (72 y.o. [name] Primary Care Provider: Vincent Gros Other Clinician: Referring Provider: Treating Provider/Extender: Greer Pickerel in Treatment: 11 Active Problems ICD-10 Encounter Code Description Active Date MDM Diagnosis L89.314 Pressure ulcer of right buttock, stage 4 09/07/2022 No Yes L89.613 Pressure ulcer of right heel, stage 3 09/28/2022 No Yes I73.00  Raynaud's syndrome without gangrene 09/07/2022 No Yes I50.32 Chronic diastolic (congestive) heart failure 09/07/2022 No Yes N18.32 Chronic kidney disease, stage 3b 09/07/2022 No Yes R73.02 Impaired glucose tolerance (oral) 09/07/2022 No Yes E66.9 Obesity, unspecified 09/07/2022 No Yes BASSY, EAKINS ([id number]) [id number] Page 5 of 10 Inactive Problems ICD-10 Code Description Active Date Inactive Date L89.313 Pressure ulcer of right buttock, stage 3 09/07/2022 09/07/2022 Resolved Problems Electronic Signature(s) Signed: 11/29/2022 2:56:33 PM By: Duanne Guess MD FACS Entered By: Duanne Guess on 11/29/2022 14:56:33 -------------------------------------------------------------------------------- Progress Note Details Patient Name: Date of Service: [name]. 11/29/2022 2:00 PM Medical Record Number: [id number] Patient Account Number: [id number] Date of Birth/Sex: Treating RN: 12-06-1950 (72 y.o. F) Primary Care Provider: Vincent Gros Other Clinician: Referring Provider: Treating Provider/Extender: Greer Pickerel in Treatment: 11 Subjective Chief Complaint Information  obtained from Patient Patient is at the clinic for treatment of open pressure ulcers History of Present Illness (HPI) ADMISSION 09/07/2023 This is a 72 year old woman with a past medical history notable for obesity, congestive heart failure, Raynaud's syndrome, CKD stage IIIb, osteoporosis, and rheumatoid arthritis. In November 2023, she suffered a fall that resulted in a femur fracture. She was hospitalized for about a week and underwent surgical repair of the fracture. She subsequently developed pressure ulcers on her right buttocks and ischium. She has been receiving home health services and they have been applying Medihoney. They have been reporting the wounds as stage II, but on evaluation, the large ulcer was probably unstageable at the time they were evaluating it but it is clearly a stage IV at this point. The smaller ulcer does have fat layer exposure and therefore is a stage III. The patient is accompanied by her daughter. She says she has been sleeping on a regular bed but recently ordered an air mattress T opper, but does not have it yet. She is on Ozempic for weight loss and therefore has a poor appetite and struggles to get adequate protein intake. 09/15/2022: The large stage IV ulcer is substantially cleaner this week. The stage III ulcer is a little smaller. Both have slough accumulation. The culture that I took last week was polymicrobial. The Augmentin that I prescribed was adequate coverage for the species and she continues to take this. We have also ordered Keystone topical antibiotic compound, but this has not yet arrived. 09/21/2022: The stage IV ulcer has some necrotic muscle at the base but is otherwise fairly clean. The stage III ulcer is smaller again with light slough on the surface. She has her Keystone topical antibiotic compound with her today. 09/29/2022: There is still some necrotic muscle at the base of the stage IV ulcer that I was unable to get to last week. The stage  III ulcer has healed. She unfortunately has developed a new pressure ulcer on her right heel. 10/06/2022: Still with some devitalized muscle at the base of the stage IV ulcer, but otherwise this wound is looking quite clean. The pressure induced tissue injury on her heel is demarcating and much of the area appears to be epithelialized, but there is still 2 areas that remain questionable. 10/12/2022: The stage IV ulcer is very clean and ready for wound VAC, which will be delivered tomorrow according to the patient. The tissue injury on her heel is drying up and does not feel particularly boggy this week. 10/19/2022: The heel injury continues to improve. There is still some dry eschar present on the more plantar aspect of it. There is some  slough accumulation on the surface of the stage IV pressure ulcer. The wound VAC was initiated by home health last week. 11/01/2022: The gluteal pressure ulcer is very clean without any necrotic tissue or slough. The depth has come in by over half a centimeter. The deep tissue injury on her heel continues to improve. There is still dry eschar that we are painting with Betadine as well as some fresh-looking viable tissue at the margin. 11/15/2022: The gluteal pressure ulcer is clean and contracting. The eschar on her heel ulcer is beginning to separate from the underlying tissue. 11/29/2022: The gluteal pressure ulcer is very clean. The depth has come in by about a centimeter. The eschar has completely separated off of her heel. There is some fibrinous exudate and slough on the heel. It has declared itself as a stage III at this point. Patient History Information obtained from Patient, Caregiver, Chart. Family History Cancer - Father,Paternal Grandparents, Heart Disease - Mother,Father,Paternal Grandparents, Hypertension - Mother, Stroke - Maternal Grandparents, No family history of Diabetes, Hereditary Spherocytosis, Kidney Disease, Lung Disease, Seizures, Thyroid Problems,  Tuberculosis. [name], [name] ([id number]) [id number] Page 6 of 10 Social History Never smoker, Marital Status - Widowed, Alcohol Use - Rarely, Drug Use - Prior History - TCH, Caffeine Use - Daily - soda, tea. Medical History Eyes Denies history of Cataracts, Glaucoma, Optic Neuritis Ear/Nose/Mouth/Throat Patient has history of Chronic sinus problems/congestion - chronic rhinitis Denies history of Middle ear problems Respiratory Patient has history of Sleep Apnea - uses CPAP Cardiovascular Patient has history of Congestive Heart Failure Endocrine Denies history of Type I Diabetes, Type II Diabetes Genitourinary Denies history of End Stage Renal Disease Immunological Patient has history of Raynaudoos Denies history of Lupus Erythematosus, Scleroderma Integumentary (Skin) Denies history of History of Burn Musculoskeletal Patient has history of Rheumatoid Arthritis Denies history of Gout, Osteoarthritis, Osteomyelitis Neurologic Patient has history of Neuropathy Denies history of Dementia, Quadriplegia, Paraplegia, Seizure Disorder Oncologic Denies history of Received Chemotherapy, Received Radiation Psychiatric Denies history of Anorexia/bulimia, Confinement Anxiety Hospitalization/Surgery History - right femur fx ORIR. - right knee replacement. - left breast mass excision. - left ankle tibia repair. - abdominal hysterectomy. - adnoidectomy/tonsillectomy. - cholecystectomy. - dental implants. - patoid cystectomy. - tubal ligation. Medical A Surgical History Notes nd Constitutional Symptoms (General Health) morbid obesity Cardiovascular hyperlipidemia Gastrointestinal GERD, IBS, eosinophilic esophagitis, diverticulosis, Genitourinary CKD stage 3 Immunological Sjoegren syndrome, fibromyalgia Objective Constitutional no acute distress. Vitals Time Taken: 2:15 PM, Height: 62 in, Weight: 207 lbs, BMI: 37.9, Temperature: 99.1 F, Pulse: 72 bpm,  Respiratory Rate: 20 breaths/min, Blood Pressure: 126/64 mmHg. Respiratory Normal work of breathing on room air. General Notes: 11/29/2022: The gluteal pressure ulcer is very clean. The depth has come in by about a centimeter. The eschar has completely separated off of her heel. There is some fibrinous exudate and slough on the heel. It has declared itself as a stage III at this point. Integumentary (Hair, Skin) Wound #1 status is Open. Original cause of wound was Pressure Injury. The date acquired was: 08/11/2022. The wound has been in treatment 11 weeks. The wound is located on the Right Gluteus. The wound measures 1.8cm length x 4.4cm width x 5.3cm depth; 6.22cm^2 area and 32.968cm^3 volume. There is Fat Layer (Subcutaneous Tissue) exposed. There is no tunneling noted, however, there is undermining starting at 12:00 and ending at 2:00 with a maximum distance of 9.3cm. There is a large amount of serosanguineous drainage noted. The wound margin is well defined and  not attached to the wound base. There is large (67-100%) red, pale granulation within the wound bed. There is no necrotic tissue within the wound bed. The periwound skin appearance had no abnormalities noted for texture. The periwound skin appearance had no abnormalities noted for moisture. The periwound skin appearance had no abnormalities noted for color. Periwound temperature was noted as No Abnormality. The periwound has tenderness on palpation. Wound #3 status is Open. Original cause of wound was Pressure Injury. The date acquired was: 09/21/2022. The wound has been in treatment 8 weeks. The wound is located on the Right Calcaneus. The wound measures 2.7cm length x 4.2cm width x 0.4cm depth; 8.906cm^2 area and 3.563cm^3 volume. There is Fat Layer (Subcutaneous Tissue) exposed. There is no tunneling or undermining noted. There is a medium amount of serosanguineous drainage noted. The wound margin is flat and intact. There is small  (1-33%) pink granulation within the wound bed. There is a large (67-100%) amount of necrotic tissue within the wound bed including Adherent Slough. The periwound skin appearance had no abnormalities noted for texture. The periwound skin appearance had no abnormalities noted for color. The periwound skin appearance exhibited: Dry/Scaly. The periwound skin appearance did not exhibit: Maceration. Periwound temperature was noted as No Abnormality. The periwound has tenderness on palpation. [name], [name] ([id number]) [id number] Page 7 of 10 Assessment Active Problems ICD-10 Pressure ulcer of right buttock, stage 4 Pressure ulcer of right heel, stage 3 Raynaud's syndrome without gangrene Chronic diastolic (congestive) heart failure Chronic kidney disease, stage 3b Impaired glucose tolerance (oral) Obesity, unspecified Procedures Wound #3 Pre-procedure diagnosis of Wound #3 is a Pressure Ulcer located on the Right Calcaneus . There was a Excisional Skin/Subcutaneous Tissue Debridement with a total area of 11.34 sq cm performed by Duanne Guess, MD. With the following instrument(s): Curette to remove Viable and Non-Viable tissue/material. Material removed includes Subcutaneous Tissue and Slough and after achieving pain control using Lidocaine 5% topical ointment. No specimens were taken. A time out was conducted at 14:45, prior to the start of the procedure. A Minimum amount of bleeding was controlled with Pressure. The procedure was tolerated well with a pain level of 3 throughout and a pain level of 1 following the procedure. Post Debridement Measurements: 2.7cm length x 4.2cm width x 0.4cm depth; 3.563cm^3 volume. Post debridement Stage noted as Category/Stage III. Character of Wound/Ulcer Post Debridement requires further debridement. Post procedure Diagnosis Wound #3: Same as Pre-Procedure General Notes: Scribed for Dr. Lady Gary by Zenaida Deed,  RN. Plan Follow-up Appointments: Return Appointment in 2 weeks. - Dr. Lady Gary RM 1 Wednesday 5/1 @ 2:00 pm Anesthetic: Wound #1 Right Gluteus: (In clinic) Topical Lidocaine 4% applied to wound bed Wound #3 Right Calcaneus: (In clinic) Topical Lidocaine 4% applied to wound bed Bathing/ Shower/ Hygiene: May shower and wash wound with soap and water. Negative Presssure Wound Therapy: Wound #1 Right Gluteus: Wound Vac to wound continuously at 138mm/hg pressure Black Foam Off-Loading: Heel suspension boot to: - globoped right foot to ambulate Low air-loss mattress (Group 2) - Adapt Turn and reposition every 2 hours - avoid lying on back, stand at least every hour while out of bed, float heels off bed with pillows under calves while in bed Prevalon Boot - right foot esp. while in bed. Additional Orders / Instructions: Follow Nutritious Diet - increase protein to 70-80 gms per day, hold ozempic Juven Shake 1-2 times daily. Home Health: New wound care orders this week; continue Home Health for wound care. May  utilize formulary equivalent dressing for wound treatment orders unless otherwise specified. Other Home Health Orders/Instructions: - [facility] The following medication(s) was prescribed: Santyl topical 250 unit/gram ointment Apply nickel thick layer to heel ulcer with dressing changes starting 11/29/2022 WOUND #1: - Gluteus Wound Laterality: Right Cleanser: Wound Cleanser 3 x Per Week/30 Days Discharge Instructions: Cleanse the wound with wound cleanser prior to applying a clean dressing using gauze sponges, not tissue or cotton balls. Prim Dressing: Promogran Prisma Matrix, 4.34 (sq in) (silver collagen) 3 x Per Week/30 Days ary Discharge Instructions: Moisten collagen with saline or hydrogel and place at base of wound under VAC sponge Prim Dressing: VAC 3 x Per Week/30 Days ary WOUND #3: - Calcaneus Wound Laterality: Right Prim Dressing: Maxorb Extra Calcium Alginate, 2x2 (in/in)  1 x Per Day/30 Days ary Discharge Instructions: Apply to wound bed if santyl unavailable Prim Dressing: Santyl Ointment 1 x Per Day/30 Days ary Discharge Instructions: Apply nickel thick amount to wound bed as instructed Secondary Dressing: ALLEVYN Heel 4 1/2in x 5 1/2in / 10.5cm x 13.5cm 1 x Per Day/30 Days Discharge Instructions: Apply over primary dressing as directed. Secondary Dressing: Woven Gauze Sponge, Non-Sterile 4x4 in 1 x Per Day/30 Days Discharge Instructions: Apply over primary dressing as directed. Secured With: [employer], 4.5x3.1 (in/yd) 1 x Per Day/30 Days Discharge Instructions: Secure with Kerlix as directed. Secured With: Paper T ape, 2x10 (in/yd) 1 x Per Day/30 Days Discharge Instructions: Secure dressing with tape as directed. [name], [name] ([id number]) [id number] Page 8 of 10 11/29/2022: The gluteal pressure ulcer is very clean. The depth has come in by about a centimeter. The eschar has completely separated off of her heel. There is some fibrinous exudate and slough on the heel. It has declared itself as a stage III at this point. The gluteal ulcer did not require any debridement. We will continue to use Prisma silver collagen at the base with the wound VAC. I debrided slough and subcutaneous tissue from the heel ulcer. This wound would benefit from ongoing enzymatic debridement so we will add Santyl. She is currently wearing a regular sandal but trying to ambulate more; we will switch her to a heel offloading Globo ped shoe. Follow-up in 2 weeks. Electronic Signature(s) Signed: 11/29/2022 4:25:22 PM By: Duanne Guess MD FACS Signed: 11/29/2022 5:05:24 PM By: Zenaida Deed RN, BSN Previous Signature: 11/29/2022 3:04:22 PM Version By: Duanne Guess MD FACS Entered By: Zenaida Deed on 11/29/2022 16:18:06 -------------------------------------------------------------------------------- HxROS Details Patient Name: Date of  Service: Marisa Roux E. 11/29/2022 2:00 PM Medical Record Number: [id number] Patient Account Number: [id number] Date of Birth/Sex: Treating RN: Feb 16, 1951 (72 y.o. F) Primary Care Provider: Vincent Gros Other Clinician: Referring Provider: Treating Provider/Extender: Greer Pickerel in Treatment: 11 Information Obtained From Patient Caregiver Chart Constitutional Symptoms (General Health) Medical History: Past Medical History Notes: morbid obesity Eyes Medical History: Negative for: Cataracts; Glaucoma; Optic Neuritis Ear/Nose/Mouth/Throat Medical History: Positive for: Chronic sinus problems/congestion - chronic rhinitis Negative for: Middle ear problems Respiratory Medical History: Positive for: Sleep Apnea - uses CPAP Cardiovascular Medical History: Positive for: Congestive Heart Failure Past Medical History Notes: hyperlipidemia Gastrointestinal Medical History: Past Medical History Notes: GERD, IBS, eosinophilic esophagitis, diverticulosis, Endocrine Medical History: Negative for: Type I Diabetes; Type II Diabetes Genitourinary Medical History: Negative for: End Stage Renal Disease Past Medical History Notes: CKD stage 3 [name], [name] ([id number]) [id number] Page 9 of 10 Immunological Medical History: Positive for: Raynauds Negative for: Lupus Erythematosus; Scleroderma  Past Medical History Notes: Sjoegren syndrome, fibromyalgia Integumentary (Skin) Medical History: Negative for: History of Burn Musculoskeletal Medical History: Positive for: Rheumatoid Arthritis Negative for: Gout; Osteoarthritis; Osteomyelitis Neurologic Medical History: Positive for: Neuropathy Negative for: Dementia; Quadriplegia; Paraplegia; Seizure Disorder Oncologic Medical History: Negative for: Received Chemotherapy; Received Radiation Psychiatric Medical History: Negative for: Anorexia/bulimia; Confinement  Anxiety HBO Extended History Items Ear/Nose/Mouth/Throat: Chronic sinus problems/congestion Immunizations Pneumococcal Vaccine: Received Pneumococcal Vaccination: Yes Received Pneumococcal Vaccination On or After 60th Birthday: Yes Implantable Devices None Hospitalization / Surgery History Type of Hospitalization/Surgery right femur fx ORIR right knee replacement left breast mass excision left ankle tibia repair abdominal hysterectomy adnoidectomy/tonsillectomy cholecystectomy dental implants patoid cystectomy tubal ligation Family and Social History Cancer: Yes - Father,Paternal Grandparents; Diabetes: No; Heart Disease: Yes - Mother,Father,Paternal Grandparents; Hereditary Spherocytosis: No; Hypertension: Yes - Mother; Kidney Disease: No; Lung Disease: No; Seizures: No; Stroke: Yes - Maternal Grandparents; Thyroid Problems: No; Tuberculosis: No; Never smoker; Marital Status - Widowed; Alcohol Use: Rarely; Drug Use: Prior History - TCH; Caffeine Use: Daily - soda, tea; Financial Concerns: No; Food, Clothing or Shelter Needs: No; Support System Lacking: No; Transportation Concerns: No Electronic Signature(s) Signed: 11/29/2022 3:59:24 PM By: Duanne Guess MD FACS Entered By: Duanne Guess on 11/29/2022 14:59:04 -------------------------------------------------------------------------------- SuperBill Details Patient Name: Date of Service: [name], [name] 11/29/2022 [name] ([id number]) [id number] Page 10 of 10 Medical Record Number: [id number] Patient Account Number: [id number] Date of Birth/Sex: Treating RN: Dec 20, 1950 (72 y.o. 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) E66.9 Obesity, unspecified Facility Procedures : CPT4 Code: [id number] Description: 11042 - DEB SUBQ TISSUE 20 SQ CM/< ICD-10 Diagnosis Description L89.613 Pressure ulcer of right heel, stage 3 Modifier: Quantity: 1 : CPT4 Code: 46962952 Description: 97605 - WOUND VAC-50 SQ CM OR LESS Modifier: 59 Quantity: 1 Physician Procedures : CPT4 Code Description Modifier [id number] 99214 - WC PHYS LEVEL 4 - EST PT 25 ICD-10 Diagnosis Description L89.314 Pressure ulcer of right buttock, stage 4 L89.613 Pressure ulcer of right heel, stage 3 I50.32 Chronic diastolic (congestive) heart failure  N18.32 Chronic kidney disease, stage 3b Quantity: 1 : [id number] 11042 - WC PHYS SUBQ TISS 20 SQ CM ICD-10 Diagnosis Description L89.613 Pressure ulcer of right heel, stage 3 Quantity: 1 Electronic Signature(s) Signed: 11/29/2022 4:12:23 PM By: Duanne Guess MD FACS Signed: 11/29/2022 5:05:24 PM By: Zenaida Deed RN, BSN Previous Signature: 11/29/2022 3:05:17 PM Version By: Duanne Guess MD FACS Entered By: Zenaida Deed on 11/29/2022 16:07:26

## 2022-11-30 NOTE — Progress Notes (Signed)
BEUNKA, MALPASS ([id number]) [id number] Page 1 of 9 Visit Report for 11/29/2022 Arrival Information Details Patient Name: Date of Service: [name], [name] 11/29/2022 2:00 PM Medical Record Number: [id number] Patient Account Number: [id number] Date of Birth/Sex: Treating RN: 06-04-1951 (72 y.o. [name] Primary Care Kisean Rollo: Vincent Gros Other Clinician: Referring Kema Santaella: Treating Rector Devonshire/Extender: Greer Pickerel in Treatment: 11 Visit Information History Since Last Visit Added or deleted any medications: No Patient Arrived: Walker Any new allergies or adverse reactions: No Arrival Time: 14:11 Had a fall or experienced change in No Accompanied By: daughter activities of daily living that may affect Transfer Assistance: None risk of falls: Patient Identification Verified: Yes Signs or symptoms of abuse/neglect since last visito No Secondary Verification Process Completed: Yes Hospitalized since last visit: No Patient Requires Transmission-Based Precautions: No Implantable device outside of the clinic excluding No Patient Has Alerts: No cellular tissue based products placed in the center since last visit: Has Dressing in Place as Prescribed: Yes Pain Present Now: Yes Electronic Signature(s) Signed: 11/29/2022 5:05:24 PM By: Zenaida Deed RN, BSN Entered By: Zenaida Deed on 11/29/2022 14:15:47 -------------------------------------------------------------------------------- Encounter Discharge Information Details Patient Name: Date of Service: Marisa Roux Cooper. 11/29/2022 2:00 PM Medical Record Number: [id number] Patient Account Number: [id number] Date of Birth/Sex: Treating RN: 1950/12/25 (72 y.o. [name] Primary Care Aulani Shipton: Vincent Gros Other Clinician: Referring Vanderbilt Ranieri: Treating Plato Alspaugh/Extender: Greer Pickerel in Treatment: 11 Encounter Discharge Information  Items Post Procedure Vitals Discharge Condition: Stable Temperature (F): 99.1 Ambulatory Status: Walker Pulse (bpm): 72 Discharge Destination: Home Respiratory Rate (breaths/min): 18 Transportation: Private Auto Blood Pressure (mmHg): 126/74 Accompanied By: daughter Schedule Follow-up Appointment: Yes Clinical Summary of Care: Patient Declined Electronic Signature(s) Signed: 11/29/2022 5:05:24 PM By: Zenaida Deed RN, BSN Entered By: Zenaida Deed on 11/29/2022 16:09:42 -------------------------------------------------------------------------------- Lower Extremity Assessment Details Patient Name: Date of Service: Marisa Roux Cooper. 11/29/2022 2:00 PM Medical Record Number: [id number] Patient Account Number: [id number] Date of Birth/Sex: Treating RN: 1951/02/03 (72 y.o. [name] Primary Care Keyira Mondesir: Vincent Gros Other Clinician: Referring Vista Sawatzky: Treating Alben Jepsen/Extender: Greer Pickerel in Treatment: 11 Edema Assessment Assessed: [name]: No] [name]: No] C[LeftZIANNE, [name] ([id number])] [Right: [id number] Page 2 of 9] Edema: [Left: Ye] [Right: s] Calf Left: Right: Point of Measurement: From Medial Instep 41 cm Ankle Left: Right: Point of Measurement: From Medial Instep 25 cm Vascular Assessment Pulses: Dorsalis Pedis Palpable: [Right:Yes] Electronic Signature(s) Signed: 11/29/2022 5:05:24 PM By: Zenaida Deed RN, BSN Entered By: Zenaida Deed on 11/29/2022 14:23:05 -------------------------------------------------------------------------------- Multi Wound Chart Details Patient Name: Date of Service: Marisa Roux Cooper. 11/29/2022 2:00 PM Medical Record Number: [id number] Patient Account Number: [id number] Date of Birth/Sex: Treating RN: [birth year] (72 y.o. F) Primary Care Akshat Minehart: Vincent Gros Other Clinician: Referring Terius Jacuinde: Treating Porfirio Bollier/Extender: Greer Pickerel in Treatment: 11 Vital Signs Height(in): 62 Pulse(bpm): 72 Weight(lbs): 207 Blood Pressure(mmHg): 126/64 Body Mass Index(BMI): 37.9 Temperature(F): 99.1 Respiratory Rate(breaths/min): 20 [1:Photos: No Photos Right Gluteus Wound Location: Pressure Injury Wounding Event: Pressure Ulcer Primary Etiology: Chronic sinus problems/congestion, Chronic sinus problems/congestion, N/A Comorbid History: Sleep Apnea, Congestive Heart Failure, Raynauds,  Rheumatoid Arthritis, Neuropathy 08/11/2022 Date Acquired: 11 Weeks of Treatment: Open Wound Status: No Wound Recurrence: 1.8x4.4x5.3 Measurements L x W x D (cm) 6.22 A (cm) : rea 32.968 Volume (cm) : 71.70% % Reduction in A rea: 75.00% % Reduction in  Volume: 12 Starting Position 1 (o'clock): 2 Ending Position 1 (o'clock):  9.3 Maximum Distance 1 (cm): Yes Undermining: Category/Stage IV Classification: Large Exudate A mount: Serosanguineous Exudate Type: red, brown Exudate Color: Well defined, not  attached Wound Margin: Large (67-100%) Granulation A mount: Red, Pale Granulation Quality: None Present (0%) Necrotic A mount: Fat Layer (Subcutaneous Tissue): Yes Fat Layer (Subcutaneous Tissue): Yes N/A Exposed Structures: Fascia: No Tendon: No Muscle:  No Joint: No Bone: No] [3:No Photos Right Calcaneus Pressure Injury Pressure Ulcer Sleep Apnea, Congestive Heart Failure, Raynauds, Rheumatoid Arthritis, Neuropathy /03/2023 8 Open No .7x4.2x0.4 8.906 3.563 7.80% -268.80% No Category/Stage III Medium  Serosanguineous red, brown Flat and Intact Small (1-33%) Pink Large (67-100%) Fascia: No Tendon: No Muscle: No Joint: No Bone: No] [N/A:N/A N/A N/A N/A N/A N/A N/A N/A N/A N/A N/A N/A N/A N/A N/A N/A N/A N/A N/A N/A N/A N/A] [name], [name] ([id number]) [1:None Epithelialization: N/A Debridement: N/A Pre-procedure Verification/Time Out Taken: N/A Pain Control: N/A Tissue Debrided: N/A Level: N/A Debridement A (sq cm): rea N/A Instrument: N/A Bleeding: N/A  Hemostasis A chieved: N/A Procedural Pain: N/A  Post Procedural Pain: N/A Debridement Treatment Response: N/A Post Debridement Measurements L x W x D (cm) N/A Post Debridement Volume: (cm) N/A Post Debridement Stage: Excoriation: No Periwound Skin Texture: Induration: No Callus: No Crepitus: No Rash:  No Scarring: No Maceration: No Periwound Skin Moisture: Dry/Scaly: No Atrophie Blanche: No Periwound Skin Color: Cyanosis: No Ecchymosis: No Erythema: No Hemosiderin Staining: No Mottled: No Pallor: No Rubor: No No Abnormality Temperature: Yes  Tenderness on Palpation: Negative Pressure Wound Therapy Procedures Performed: Maintenance (NPWT)] [3:Small (1-33%) Debridement - Excisional 14:45 Lidocaine 5% topical ointment Subcutaneous, Slough Skin/Subcutaneous Tissue 11.34 Curette Minimum Pressure  3 1 Procedure was tolerated well 2.7x4.2x0.4 3.563 Category/Stage III No Abnormalities Noted Dry/Scaly: Yes Maceration: No No Abnormalities Noted No Abnormality Yes Debridement] [N/A:[id number] Page 3 of 9 N/A N/A N/A N/A N/A  N/A N/A N/A N/A N/A N/A N/A N/A N/A N/A N/A N/A N/A N/A N/A N/A N/A] Treatment Notes Electronic Signature(s) Signed: 11/29/2022 2:57:01 PM By: Duanne Guess MD FACS Entered By: Duanne Guess on 11/29/2022 14:57:01 -------------------------------------------------------------------------------- Multi-Disciplinary Care Plan Details Patient Name: Date of Service: Marisa Roux Cooper. 11/29/2022 2:00 PM Medical Record Number: [id number] Patient Account Number: [id number] Date of Birth/Sex: Treating RN: Oct 30, 1950 (72 y.o. [name] Primary Care Wilberth Damon: Vincent Gros Other Clinician: Referring Kaziyah Parkison: Treating Oluwakemi Salsberry/Extender: Greer Pickerel in Treatment: 11 Multidisciplinary Care Plan reviewed with physician Active Inactive Pressure Nursing Diagnoses: Knowledge deficit related to management of pressures  ulcers Goals: Patient will remain free of pressure ulcers Date Initiated: 09/15/2022 Date Inactivated: 09/28/2022 Target Resolution Date: 11/09/2022 Unmet Reason: new pressure ulcer Goal Status: Unmet right heel Patient/caregiver will verbalize risk factors for pressure ulcer development Date Initiated: 09/15/2022 Target Resolution Date: 01/12/2023 Goal Status: Active Interventions: Assess potential for pressure ulcer upon admission and as needed [name], [name] ([id number]) [phone].pdf Page 4 of 9 Treatment Activities: Patient referred for pressure reduction/relief devices : 09/15/2022 Pressure reduction/relief device ordered : 09/15/2022 Notes: Wound/Skin Impairment Nursing Diagnoses: Impaired tissue integrity Knowledge deficit related to ulceration/compromised skin integrity Goals: Patient/caregiver will verbalize understanding of skin care regimen Date Initiated: 09/28/2022 Target Resolution Date: 12/07/2022 Goal Status: Active Ulcer/skin breakdown will have a volume reduction of 50% by week 8 Date Initiated: 09/28/2022 Date Inactivated: 11/01/2022 Target Resolution Date: 11/02/2022 Goal Status: Met Ulcer/skin breakdown will have a volume reduction of 80% by week 12 Date Initiated: 11/01/2022 Date Inactivated: 11/29/2022 Target Resolution Date: 11/29/2022 Unmet Reason:  vac in place, still Goal Status: Unmet debriding heel Interventions: Assess patient/caregiver ability to obtain necessary supplies Assess patient/caregiver ability to perform ulcer/skin care regimen upon admission and as needed Assess ulceration(s) every visit Provide education on ulcer and skin care Treatment Activities: Skin care regimen initiated : 09/28/2022 Topical wound management initiated : 09/28/2022 Notes: Electronic Signature(s) Signed: 11/29/2022 5:05:24 PM By: Zenaida Deed RN, BSN Entered By: Zenaida Deed on 11/29/2022  14:40:05 -------------------------------------------------------------------------------- Negative Pressure Wound Therapy Maintenance (NPWT) Details Patient Name: Date of Service: MADDYSON, KEIL 11/29/2022 2:00 PM Medical Record Number: [id number] Patient Account Number: [id number] Date of Birth/Sex: Treating RN: 09/04/1950 (72 y.o. [name] Primary Care Ladene Allocca: Vincent Gros Other Clinician: Referring Latisa Belay: Treating Kadrian Partch/Extender: Greer Pickerel in Treatment: 11 NPWT Maintenance Performed for: Wound #1 Right Gluteus Additional Injuries Covered: No Performed By: Zenaida Deed, RN Type: VAC System Coverage Size (sq cm): 7.92 Pressure Type: Constant Pressure Setting: 125 mmHG Drain Type: None Primary Contact: Other : collagen Sponge/Dressing Type: Foam- Black Date Initiated: 10/16/2022 Dressing Removed: Yes Quantity of Sponges/Gauze Removed: 1 Canister Changed: Yes Canister Exudate Volume: 150 Dressing Reapplied: Yes Quantity of Sponges/Gauze Inserted: 1 Respones T Treatment: o good Days On NPWT : 45 Post Procedure Diagnosis Same as [name], [name] ([id number]) [id number] Page 5 of 9 Electronic Signature(s) Signed: 11/29/2022 5:05:24 PM By: Zenaida Deed RN, BSN Entered By: Zenaida Deed on 11/29/2022 14:47:17 -------------------------------------------------------------------------------- Pain Assessment Details Patient Name: Date of Service: Marisa Roux Cooper. 11/29/2022 2:00 PM Medical Record Number: [id number] Patient Account Number: [id number] Date of Birth/Sex: Treating RN: [birth year] (72 y.o. [name] Primary Care Christabel Camire: Vincent Gros Other Clinician: Referring Elvia Aydin: Treating Jasemine Nawaz/Extender: Greer Pickerel in Treatment: 11 Active Problems Location of Pain Severity and Description of Pain Patient Has Paino Yes Site Locations Pain  Location: Pain in Ulcers With Dressing Change: Yes Duration of the Pain. Constant / Intermittento Intermittent Rate the pain. Current Pain Level: 0 Worst Pain Level: 5 Least Pain Level: 0 Character of Pain Describe the Pain: Aching Pain Management and Medication Current Pain Management: Medication: Yes Is the Current Pain Management Adequate: Adequate How does your wound impact your activities of daily livingo Sleep: Yes Bathing: No Appetite: No Relationship With Others: No Bladder Continence: No Emotions: Yes Bowel Continence: No Work: No Toileting: No Drive: No Dressing: No Hobbies: No Electronic Signature(s) Signed: 11/29/2022 5:05:24 PM By: Zenaida Deed RN, BSN Entered By: Zenaida Deed on 11/29/2022 14:18:48 -------------------------------------------------------------------------------- Patient/Caregiver Education Details Patient Name: Date of Service: [name] 4/17/2024andnbsp2:00 PM Medical Record Number: [id number] Patient Account Number: [id number] Date of Birth/Gender: Treating RN: Dec 09, 1950 (72 y.o. [name] Primary Care Physician: Vincent Gros Other Clinician: Referring Physician: Treating Physician/Extender: Greer Pickerel in Treatment: [address] [name], [name] ([id number]) [id number] Page 6 of 9 Education Provided To: Patient Education Topics Provided Pressure: Methods: Explain/Verbal Responses: Reinforcements needed, State content correctly Wound/Skin Impairment: Methods: Explain/Verbal Responses: Reinforcements needed, State content correctly Electronic Signature(s) Signed: 11/29/2022 5:05:24 PM By: Zenaida Deed RN, BSN Entered By: Zenaida Deed on 11/29/2022 14:40:53 -------------------------------------------------------------------------------- Wound Assessment Details Patient Name: Date of Service: Marisa Roux Cooper. 11/29/2022 2:00 PM Medical  Record Number: [id number] Patient Account Number: [id number] Date of Birth/Sex: Treating RN: 07/04/1951 (72 y.o. [name] Primary Care Willadeen Colantuono: Vincent Gros Other Clinician: Referring Amonte Brookover: Treating Jakari Sada/Extender: Greer Pickerel in Treatment: 11 Wound Status Wound Number: 1 Primary Pressure Ulcer Etiology:  Wound Location: Right Gluteus Wound Open Wounding Event: Pressure Injury Status: Date Acquired: 08/11/2022 Comorbid Chronic sinus problems/congestion, Sleep Apnea, Congestive Heart Weeks Of Treatment: 11 History: Failure, Raynauds, Rheumatoid Arthritis, Neuropathy Clustered Wound: No Photos Wound Measurements Length: (cm) 1.8 Width: (cm) 4.4 Depth: (cm) 5.3 Area: (cm) 6.22 Volume: (cm) 32.968 % Reduction in Area: 71.7% % Reduction in Volume: 75% Epithelialization: None Tunneling: No Undermining: Yes Starting Position (o'clock): 12 Ending Position (o'clock): 2 Maximum Distance: (cm) 9.3 Wound Description Classification: Category/Stage IV Wound Margin: Well defined, not attached Exudate Amount: Large Exudate Type: Serosanguineous Exudate Color: red, brown Foul Odor After Cleansing: No Slough/Fibrino Yes Wound Bed Granulation Amount: Large (67-100%) Exposed Structure Granulation Quality: Red, Pale Fascia Exposed: No [name], [name] ([id number]) [id number] Page 7 of 9 Necrotic Amount: None Present (0%) Fat Layer (Subcutaneous Tissue) Exposed: Yes Tendon Exposed: No Muscle Exposed: No Joint Exposed: No Bone Exposed: No Periwound Skin Texture Texture Color No Abnormalities Noted: Yes No Abnormalities Noted: Yes Moisture Temperature / Pain No Abnormalities Noted: Yes Temperature: No Abnormality Tenderness on Palpation: Yes Treatment Notes Wound #1 (Gluteus) Wound Laterality: Right Cleanser Wound Cleanser Discharge Instruction: Cleanse the wound with wound cleanser prior to applying a  clean dressing using gauze sponges, not tissue or cotton balls. Peri-Wound Care Topical Primary Dressing Promogran Prisma Matrix, 4.34 (sq in) (silver collagen) Discharge Instruction: Moisten collagen with saline or hydrogel and place at base of wound under VAC sponge VAC Secondary Dressing Secured With Compression Wrap Compression Stockings Add-Ons Electronic Signature(s) Signed: 11/29/2022 5:05:24 PM By: Zenaida Deed RN, BSN Entered By: Zenaida Deed on 11/29/2022 16:08:03 -------------------------------------------------------------------------------- Wound Assessment Details Patient Name: Date of Service: Marisa Roux Cooper. 11/29/2022 2:00 PM Medical Record Number: [id number] Patient Account Number: [id number] Date of Birth/Sex: Treating RN: Dec 11, 1950 (72 y.o. [name] Primary Care Leyan Branden: Vincent Gros Other Clinician: Referring Safari Cinque: Treating Leemon Ayala/Extender: Greer Pickerel in Treatment: 11 Wound Status Wound Number: 3 Primary Pressure Ulcer Etiology: Wound Location: Right Calcaneus Wound Open Wounding Event: Pressure Injury Status: Date Acquired: 09/21/2022 Comorbid Chronic sinus problems/congestion, Sleep Apnea, Congestive Heart Weeks Of Treatment: 8 History: Failure, Raynauds, Rheumatoid Arthritis, Neuropathy Clustered Wound: No Photos [name], [name] ([id number]) [id number] Page 8 of 9 Wound Measurements Length: (cm) 2.7 Width: (cm) 4.2 Depth: (cm) 0.4 Area: (cm) 8.906 Volume: (cm) 3.563 % Reduction in Area: 7.8% % Reduction in Volume: -268.8% Epithelialization: Small (1-33%) Tunneling: No Undermining: No Wound Description Classification: Category/Stage III Wound Margin: Flat and Intact Exudate Amount: Medium Exudate Type: Serosanguineous Exudate Color: red, brown Foul Odor After Cleansing: No Slough/Fibrino Yes Wound Bed Granulation Amount: Small (1-33%) Exposed  Structure Granulation Quality: Pink Fascia Exposed: No Necrotic Amount: Large (67-100%) Fat Layer (Subcutaneous Tissue) Exposed: Yes Necrotic Quality: Adherent Slough Tendon Exposed: No Muscle Exposed: No Joint Exposed: No Bone Exposed: No Periwound Skin Texture Texture Color No Abnormalities Noted: Yes No Abnormalities Noted: Yes Moisture Temperature / Pain No Abnormalities Noted: No Temperature: No Abnormality Dry / Scaly: Yes Tenderness on Palpation: Yes Maceration: No Treatment Notes Wound #3 (Calcaneus) Wound Laterality: Right Cleanser Peri-Wound Care Topical Primary Dressing Maxorb Extra Calcium Alginate, 2x2 (in/in) Discharge Instruction: Apply to wound bed if santyl unavailable Santyl Ointment Discharge Instruction: Apply nickel thick amount to wound bed as instructed Secondary Dressing ALLEVYN Heel 4 1/2in x 5 1/2in / 10.5cm x 13.5cm Discharge Instruction: Apply over primary dressing as directed. Woven Gauze Sponge, Non-Sterile 4x4 in Discharge Instruction: Apply over primary dressing as directed. Secured With [employer]  Roll Sterile, 4.5x3.1 (in/yd) Discharge Instruction: Secure with Kerlix as directed. Paper Tape, 2x10 (in/yd) Discharge Instruction: Secure dressing with tape as directed. Compression [name], DOLBOW ([id number]) [phone].pdf Page 9 of 9 Compression Stockings Add-Ons Electronic Signature(s) Signed: 11/29/2022 5:05:24 PM By: Zenaida Deed RN, BSN Entered By: Zenaida Deed on 11/29/2022 16:08:23 -------------------------------------------------------------------------------- Vitals Details Patient Name: Date of Service: Marisa Roux Cooper. 11/29/2022 2:00 PM Medical Record Number: [id number] Patient Account Number: [id number] Date of Birth/Sex: Treating RN: 1951/03/26 (72 y.o. [name] Primary Care Jerrin Recore: Vincent Gros Other Clinician: Referring Cleveland Yarbro: Treating Jimmie Dattilio/Extender: Greer Pickerel in Treatment: 11 Vital Signs Time Taken: 14:15 Temperature (F): 99.1 Height (in): 62 Pulse (bpm): 72 Weight (lbs): 207 Respiratory Rate (breaths/min): 20 Body Mass Index (BMI): 37.9 Blood Pressure (mmHg): 126/64 Reference Range: 80 - 120 mg / dl Electronic Signature(s) Signed: 11/29/2022 5:05:24 PM By: Zenaida Deed RN, BSN Entered By: Zenaida Deed on 11/29/2022 14:16:42

## 2022-12-01 DIAGNOSIS — Z9181 History of falling: Secondary | ICD-10-CM | POA: Diagnosis not present

## 2022-12-01 DIAGNOSIS — L89612 Pressure ulcer of right heel, stage 2: Secondary | ICD-10-CM | POA: Diagnosis not present

## 2022-12-01 DIAGNOSIS — M797 Fibromyalgia: Secondary | ICD-10-CM | POA: Diagnosis not present

## 2022-12-01 DIAGNOSIS — G4733 Obstructive sleep apnea (adult) (pediatric): Secondary | ICD-10-CM | POA: Diagnosis not present

## 2022-12-01 DIAGNOSIS — J329 Chronic sinusitis, unspecified: Secondary | ICD-10-CM | POA: Diagnosis not present

## 2022-12-01 DIAGNOSIS — Z7982 Long term (current) use of aspirin: Secondary | ICD-10-CM | POA: Diagnosis not present

## 2022-12-01 DIAGNOSIS — Z7985 Long-term (current) use of injectable non-insulin antidiabetic drugs: Secondary | ICD-10-CM | POA: Diagnosis not present

## 2022-12-01 DIAGNOSIS — M069 Rheumatoid arthritis, unspecified: Secondary | ICD-10-CM | POA: Diagnosis not present

## 2022-12-01 DIAGNOSIS — G8929 Other chronic pain: Secondary | ICD-10-CM | POA: Diagnosis not present

## 2022-12-01 DIAGNOSIS — N1832 Chronic kidney disease, stage 3b: Secondary | ICD-10-CM | POA: Diagnosis not present

## 2022-12-01 DIAGNOSIS — Z96641 Presence of right artificial hip joint: Secondary | ICD-10-CM | POA: Diagnosis not present

## 2022-12-01 DIAGNOSIS — K219 Gastro-esophageal reflux disease without esophagitis: Secondary | ICD-10-CM | POA: Diagnosis not present

## 2022-12-01 DIAGNOSIS — M545 Low back pain, unspecified: Secondary | ICD-10-CM | POA: Diagnosis not present

## 2022-12-01 DIAGNOSIS — M810 Age-related osteoporosis without current pathological fracture: Secondary | ICD-10-CM | POA: Diagnosis not present

## 2022-12-01 DIAGNOSIS — I130 Hypertensive heart and chronic kidney disease with heart failure and stage 1 through stage 4 chronic kidney disease, or unspecified chronic kidney disease: Secondary | ICD-10-CM | POA: Diagnosis not present

## 2022-12-01 DIAGNOSIS — I5032 Chronic diastolic (congestive) heart failure: Secondary | ICD-10-CM | POA: Diagnosis not present

## 2022-12-01 DIAGNOSIS — L89314 Pressure ulcer of right buttock, stage 4: Secondary | ICD-10-CM | POA: Diagnosis not present

## 2022-12-01 DIAGNOSIS — M3500 Sicca syndrome, unspecified: Secondary | ICD-10-CM | POA: Diagnosis not present

## 2022-12-01 DIAGNOSIS — E1122 Type 2 diabetes mellitus with diabetic chronic kidney disease: Secondary | ICD-10-CM | POA: Diagnosis not present

## 2022-12-01 DIAGNOSIS — Z87310 Personal history of (healed) osteoporosis fracture: Secondary | ICD-10-CM | POA: Diagnosis not present

## 2022-12-02 DIAGNOSIS — R7302 Impaired glucose tolerance (oral): Secondary | ICD-10-CM | POA: Diagnosis not present

## 2022-12-02 DIAGNOSIS — L89314 Pressure ulcer of right buttock, stage 4: Secondary | ICD-10-CM | POA: Diagnosis not present

## 2022-12-02 DIAGNOSIS — N1832 Chronic kidney disease, stage 3b: Secondary | ICD-10-CM | POA: Diagnosis not present

## 2022-12-02 DIAGNOSIS — I5032 Chronic diastolic (congestive) heart failure: Secondary | ICD-10-CM | POA: Diagnosis not present

## 2022-12-02 DIAGNOSIS — L89313 Pressure ulcer of right buttock, stage 3: Secondary | ICD-10-CM | POA: Diagnosis not present

## 2022-12-06 ENCOUNTER — Telehealth: Payer: Self-pay

## 2022-12-06 NOTE — Telephone Encounter (Signed)
Hey. Do you mind calling to give verbal order OK for this? Please and thank you

## 2022-12-06 NOTE — Telephone Encounter (Signed)
Called Miorah from [facility] orders were given for OT

## 2022-12-06 NOTE — Telephone Encounter (Signed)
Miorah from [facility] is requesting verbal orders for mutual patient for  Occupational Therapy , 1x a week foe 8 weeks, please give her a call at [phone].

## 2022-12-11 ENCOUNTER — Other Ambulatory Visit: Payer: Self-pay | Admitting: Cardiology

## 2022-12-11 ENCOUNTER — Other Ambulatory Visit: Payer: Self-pay | Admitting: Nurse Practitioner

## 2022-12-11 DIAGNOSIS — M79604 Pain in right leg: Secondary | ICD-10-CM

## 2022-12-11 DIAGNOSIS — S7291XD Unspecified fracture of right femur, subsequent encounter for closed fracture with routine healing: Secondary | ICD-10-CM

## 2022-12-12 DIAGNOSIS — R7302 Impaired glucose tolerance (oral): Secondary | ICD-10-CM | POA: Diagnosis not present

## 2022-12-12 DIAGNOSIS — I5032 Chronic diastolic (congestive) heart failure: Secondary | ICD-10-CM | POA: Diagnosis not present

## 2022-12-12 DIAGNOSIS — S72401D Unspecified fracture of lower end of right femur, subsequent encounter for closed fracture with routine healing: Secondary | ICD-10-CM | POA: Diagnosis not present

## 2022-12-12 DIAGNOSIS — L89313 Pressure ulcer of right buttock, stage 3: Secondary | ICD-10-CM | POA: Diagnosis not present

## 2022-12-12 DIAGNOSIS — I5022 Chronic systolic (congestive) heart failure: Secondary | ICD-10-CM | POA: Diagnosis not present

## 2022-12-12 DIAGNOSIS — N1832 Chronic kidney disease, stage 3b: Secondary | ICD-10-CM | POA: Diagnosis not present

## 2022-12-12 DIAGNOSIS — L89314 Pressure ulcer of right buttock, stage 4: Secondary | ICD-10-CM | POA: Diagnosis not present

## 2022-12-12 DIAGNOSIS — M6281 Muscle weakness (generalized): Secondary | ICD-10-CM | POA: Diagnosis not present

## 2022-12-12 DIAGNOSIS — R2689 Other abnormalities of gait and mobility: Secondary | ICD-10-CM | POA: Diagnosis not present

## 2022-12-13 ENCOUNTER — Encounter (HOSPITAL_BASED_OUTPATIENT_CLINIC_OR_DEPARTMENT_OTHER): Payer: PPO | Attending: General Surgery | Admitting: General Surgery

## 2022-12-13 DIAGNOSIS — N1832 Chronic kidney disease, stage 3b: Secondary | ICD-10-CM | POA: Insufficient documentation

## 2022-12-13 DIAGNOSIS — G4730 Sleep apnea, unspecified: Secondary | ICD-10-CM | POA: Insufficient documentation

## 2022-12-13 DIAGNOSIS — M797 Fibromyalgia: Secondary | ICD-10-CM | POA: Diagnosis not present

## 2022-12-13 DIAGNOSIS — E785 Hyperlipidemia, unspecified: Secondary | ICD-10-CM | POA: Insufficient documentation

## 2022-12-13 DIAGNOSIS — M069 Rheumatoid arthritis, unspecified: Secondary | ICD-10-CM | POA: Diagnosis not present

## 2022-12-13 DIAGNOSIS — L89314 Pressure ulcer of right buttock, stage 4: Secondary | ICD-10-CM | POA: Insufficient documentation

## 2022-12-13 DIAGNOSIS — I7300 Raynaud's syndrome without gangrene: Secondary | ICD-10-CM | POA: Insufficient documentation

## 2022-12-13 DIAGNOSIS — L89613 Pressure ulcer of right heel, stage 3: Secondary | ICD-10-CM | POA: Diagnosis not present

## 2022-12-13 DIAGNOSIS — Z7985 Long-term (current) use of injectable non-insulin antidiabetic drugs: Secondary | ICD-10-CM | POA: Insufficient documentation

## 2022-12-13 DIAGNOSIS — R7302 Impaired glucose tolerance (oral): Secondary | ICD-10-CM | POA: Diagnosis not present

## 2022-12-13 DIAGNOSIS — K219 Gastro-esophageal reflux disease without esophagitis: Secondary | ICD-10-CM | POA: Diagnosis not present

## 2022-12-13 DIAGNOSIS — I5032 Chronic diastolic (congestive) heart failure: Secondary | ICD-10-CM | POA: Insufficient documentation

## 2022-12-13 DIAGNOSIS — G4733 Obstructive sleep apnea (adult) (pediatric): Secondary | ICD-10-CM | POA: Diagnosis not present

## 2022-12-14 ENCOUNTER — Telehealth: Payer: Self-pay | Admitting: *Deleted

## 2022-12-14 DIAGNOSIS — M069 Rheumatoid arthritis, unspecified: Secondary | ICD-10-CM | POA: Diagnosis not present

## 2022-12-14 DIAGNOSIS — J329 Chronic sinusitis, unspecified: Secondary | ICD-10-CM | POA: Diagnosis not present

## 2022-12-14 DIAGNOSIS — Z96641 Presence of right artificial hip joint: Secondary | ICD-10-CM | POA: Diagnosis not present

## 2022-12-14 DIAGNOSIS — M810 Age-related osteoporosis without current pathological fracture: Secondary | ICD-10-CM | POA: Diagnosis not present

## 2022-12-14 DIAGNOSIS — L89612 Pressure ulcer of right heel, stage 2: Secondary | ICD-10-CM | POA: Diagnosis not present

## 2022-12-14 DIAGNOSIS — N1832 Chronic kidney disease, stage 3b: Secondary | ICD-10-CM | POA: Diagnosis not present

## 2022-12-14 DIAGNOSIS — I130 Hypertensive heart and chronic kidney disease with heart failure and stage 1 through stage 4 chronic kidney disease, or unspecified chronic kidney disease: Secondary | ICD-10-CM | POA: Diagnosis not present

## 2022-12-14 DIAGNOSIS — Z7985 Long-term (current) use of injectable non-insulin antidiabetic drugs: Secondary | ICD-10-CM | POA: Diagnosis not present

## 2022-12-14 DIAGNOSIS — Z87310 Personal history of (healed) osteoporosis fracture: Secondary | ICD-10-CM | POA: Diagnosis not present

## 2022-12-14 DIAGNOSIS — M797 Fibromyalgia: Secondary | ICD-10-CM | POA: Diagnosis not present

## 2022-12-14 DIAGNOSIS — Z9181 History of falling: Secondary | ICD-10-CM | POA: Diagnosis not present

## 2022-12-14 DIAGNOSIS — G4733 Obstructive sleep apnea (adult) (pediatric): Secondary | ICD-10-CM | POA: Diagnosis not present

## 2022-12-14 DIAGNOSIS — I5032 Chronic diastolic (congestive) heart failure: Secondary | ICD-10-CM | POA: Diagnosis not present

## 2022-12-14 DIAGNOSIS — L89314 Pressure ulcer of right buttock, stage 4: Secondary | ICD-10-CM | POA: Diagnosis not present

## 2022-12-14 DIAGNOSIS — Z7982 Long term (current) use of aspirin: Secondary | ICD-10-CM | POA: Diagnosis not present

## 2022-12-14 DIAGNOSIS — G8929 Other chronic pain: Secondary | ICD-10-CM | POA: Diagnosis not present

## 2022-12-14 DIAGNOSIS — M3500 Sicca syndrome, unspecified: Secondary | ICD-10-CM | POA: Diagnosis not present

## 2022-12-14 DIAGNOSIS — K219 Gastro-esophageal reflux disease without esophagitis: Secondary | ICD-10-CM | POA: Diagnosis not present

## 2022-12-14 DIAGNOSIS — E1122 Type 2 diabetes mellitus with diabetic chronic kidney disease: Secondary | ICD-10-CM | POA: Diagnosis not present

## 2022-12-14 DIAGNOSIS — M545 Low back pain, unspecified: Secondary | ICD-10-CM | POA: Diagnosis not present

## 2022-12-14 NOTE — Progress Notes (Signed)
Marisa, Cooper ([id number]) [id number] Page 1 of 10 Visit Report for 12/13/2022 Chief Complaint Document Details Patient Name: Date of Service: [name], [name] 12/13/2022 2:00 PM Medical Record Number: [id number] Patient Account Number: [id number] Date of Birth/Sex: Treating RN: 02-16-1951 (72 y.o. F) Primary Care Provider: Vincent Gros Other Clinician: Referring Provider: Treating Provider/Extender: Greer Pickerel in Treatment: 13 Information Obtained from: Patient Chief Complaint Patient is at the clinic for treatment of open pressure ulcers Electronic Signature(s) Signed: 12/13/2022 2:53:52 PM By: Duanne Guess MD FACS Entered By: Duanne Guess on 12/13/2022 14:53:52 -------------------------------------------------------------------------------- Debridement Details Patient Name: Date of Service: [name] 12/13/2022 2:00 PM Medical Record Number: [id number] Patient Account Number: [id number] Date of Birth/Sex: Treating RN: [birth year] (72 y.o. [name] Primary Care Provider: Vincent Gros Other Clinician: Referring Provider: Treating Provider/Extender: Greer Pickerel in Treatment: 13 Debridement Performed for Assessment: Wound #3 Right Calcaneus Performed By: Physician Duanne Guess, MD Debridement Type: Debridement Level of Consciousness (Pre-procedure): Awake and Alert Pre-procedure Verification/Time Out Yes - 14:40 Taken: Start Time: 14:41 Pain Control: Lidocaine 4% T opical Solution Percent of Wound Bed Debrided: 100% T Area Debrided (cm): otal 7.91 Tissue and other material debrided: Viable, Non-Viable, Slough, Subcutaneous, Slough Level: Skin/Subcutaneous Tissue Debridement Description: Excisional Instrument: Curette Bleeding: Minimum Hemostasis Achieved: Pressure Procedural Pain: 0 Post Procedural Pain: 0 Response to Treatment: Procedure was tolerated  well Level of Consciousness (Post- Awake and Alert procedure): Post Debridement Measurements of Total Wound Length: (cm) 2.4 Stage: Category/Stage III Width: (cm) 4.2 Depth: (cm) 0.5 Volume: (cm) 3.958 Character of Wound/Ulcer Post Debridement: Improved Post Procedure Diagnosis Same as Pre-procedure Notes Scribed for Dr Lady Gary by Zenaida Deed, RN Electronic Signature(s) Signed: 12/13/2022 4:34:50 PM By: Duanne Guess MD FACS Signed: 12/13/2022 4:44:16 PM By: Zenaida Deed RN, BSN [place], [name] ([id number]) [id number] Page 2 of 10 Entered By: Zenaida Deed on 12/13/2022 14:44:11 -------------------------------------------------------------------------------- HPI Details Patient Name: Date of Service: Marisa, Cooper 12/13/2022 2:00 PM Medical Record Number: [id number] Patient Account Number: [id number] Date of Birth/Sex: Treating RN: [birth year] (72 y.o. F) Primary Care Provider: Vincent Gros Other Clinician: Referring Provider: Treating Provider/Extender: Greer Pickerel in Treatment: 13 History of Present Illness HPI Description: ADMISSION 09/07/2023 This is a 72 year old woman with a past medical history notable for obesity, congestive heart failure, Raynaud's syndrome, CKD stage IIIb, osteoporosis, and rheumatoid arthritis. In November 2023, she suffered a fall that resulted in a femur fracture. She was hospitalized for about a week and underwent surgical repair of the fracture. She subsequently developed pressure ulcers on her right buttocks and ischium. She has been receiving home health services and they have been applying Medihoney. They have been reporting the wounds as stage II, but on evaluation, the large ulcer was probably unstageable at the time they were evaluating it but it is clearly a stage IV at this point. The smaller ulcer does have fat layer exposure and therefore is a stage III. The patient  is accompanied by her daughter. She says she has been sleeping on a regular bed but recently ordered an air mattress T opper, but does not have it yet. She is on Ozempic for weight loss and therefore has a poor appetite and struggles to get adequate protein intake. 09/15/2022: The large stage IV ulcer is substantially cleaner this week. The stage III ulcer is a little smaller. Both have slough accumulation. The culture that I took last week was polymicrobial. The Augmentin  that I prescribed was adequate coverage for the species and she continues to take this. We have also ordered Keystone topical antibiotic compound, but this has not yet arrived. 09/21/2022: The stage IV ulcer has some necrotic muscle at the base but is otherwise fairly clean. The stage III ulcer is smaller again with light slough on the surface. She has her Keystone topical antibiotic compound with her today. 09/29/2022: There is still some necrotic muscle at the base of the stage IV ulcer that I was unable to get to last week. The stage III ulcer has healed. She unfortunately has developed a new pressure ulcer on her right heel. 10/06/2022: Still with some devitalized muscle at the base of the stage IV ulcer, but otherwise this wound is looking quite clean. The pressure induced tissue injury on her heel is demarcating and much of the area appears to be epithelialized, but there is still 2 areas that remain questionable. 10/12/2022: The stage IV ulcer is very clean and ready for wound VAC, which will be delivered tomorrow according to the patient. The tissue injury on her heel is drying up and does not feel particularly boggy this week. 10/19/2022: The heel injury continues to improve. There is still some dry eschar present on the more plantar aspect of it. There is some slough accumulation on the surface of the stage IV pressure ulcer. The wound VAC was initiated by home health last week. 11/01/2022: The gluteal pressure ulcer is very clean  without any necrotic tissue or slough. The depth has come in by over half a centimeter. The deep tissue injury on her heel continues to improve. There is still dry eschar that we are painting with Betadine as well as some fresh-looking viable tissue at the margin. 11/15/2022: The gluteal pressure ulcer is clean and contracting. The eschar on her heel ulcer is beginning to separate from the underlying tissue. 11/29/2022: The gluteal pressure ulcer is very clean. The depth has come in by about a centimeter. The eschar has completely separated off of her heel. There is some fibrinous exudate and slough on the heel. It has declared itself as a stage III at this point. 12/13/2022: The heel ulcer is smaller and much cleaner, but still has some non-viable tissue present. The orifice of the gluteal ulcer is contracting, but the depth remains the same. Fortunately, the sponge for the [facility] is being packed appropriately into the full depth of the wound. Electronic Signature(s) Signed: 12/13/2022 2:57:17 PM By: Duanne Guess MD FACS Entered By: Duanne Guess on 12/13/2022 14:57:17 -------------------------------------------------------------------------------- Physical Exam Details Patient Name: Date of Service: [name] 12/13/2022 2:00 PM Medical Record Number: [id number] Patient Account Number: [id number] Date of Birth/Sex: Treating RN: 1950/12/31 (72 y.o. F) Primary Care Provider: Vincent Gros Other Clinician: Referring Provider: Treating Provider/Extender: Greer Pickerel in Treatment: 13 Constitutional . . . . no acute distress. Respiratory Normal work of breathing on room air. Notes DESTENIE, INGBER ([id number]) [id number] Page 3 of 10 12/13/2022: The heel ulcer is smaller and much cleaner, but still has some non-viable tissue present. The orifice of the gluteal ulcer is contracting, but the depth remains the same. Electronic  Signature(s) Signed: 12/13/2022 2:57:41 PM By: Duanne Guess MD FACS Entered By: Duanne Guess on 12/13/2022 14:57:41 -------------------------------------------------------------------------------- Physician Orders Details Patient Name: Date of Service: [name] 12/13/2022 2:00 PM Medical Record Number: [id number] Patient Account Number: [id number] Date of Birth/Sex: Treating RN: [birth year] (72 y.o. [place], [facility]  Provider: Vincent Gros Other Clinician: Referring Provider: Treating Provider/Extender: Greer Pickerel in Treatment: 65 Verbal / Phone Orders: No Diagnosis Coding ICD-10 Coding Code Description L89.314 Pressure ulcer of right buttock, stage 4 L89.613 Pressure ulcer of right heel, stage 3 I73.00 Raynaud's syndrome without gangrene I50.32 Chronic diastolic (congestive) heart failure N18.32 Chronic kidney disease, stage 3b R73.02 Impaired glucose tolerance (oral) E66.9 Obesity, unspecified Follow-up Appointments ppointment in 2 weeks. - Dr. Lady Gary RM 1 Return A Wednesday 5/15 @ 2:00 pm Anesthetic Wound #1 Right Gluteus (In clinic) Topical Lidocaine 4% applied to wound bed Wound #3 Right Calcaneus (In clinic) Topical Lidocaine 4% applied to wound bed Bathing/ Shower/ Hygiene May shower and wash wound with soap and water. Negative Presssure Wound Therapy Wound #1 Right Gluteus Wound Vac to wound continuously at 172mm/hg pressure Black Foam Off-Loading Heel suspension boot to: - globoped right foot to ambulate Low air-loss mattress (Group 2) - Adapt Turn and reposition every 2 hours - avoid lying on back, stand at least every hour while out of bed, float heels off bed with pillows under calves while in bed Prevalon Boot - right foot esp. while in bed. Additional Orders / Instructions Follow Nutritious Diet - increase protein to 70-80 gms per day, hold ozempic Juven Shake 1-2 times daily. Home Health No change  in wound care orders this week; continue Home Health for wound care. May utilize formulary equivalent dressing for wound treatment orders unless otherwise specified. Other Home Health Orders/Instructions: - [facility] Wound Treatment Wound #1 - Gluteus Wound Laterality: Right Cleanser: Wound Cleanser 3 x Per Week/30 Days Discharge Instructions: Cleanse the wound with wound cleanser prior to applying a clean dressing using gauze sponges, not tissue or cotton balls. Prim Dressing: Promogran Prisma Matrix, 4.34 (sq in) (silver collagen) 3 x Per Week/30 Days ary Discharge Instructions: Moisten collagen with saline or hydrogel and place at base of wound under VAC sponge MYRLA, MALANOWSKI E ([id number]) [id number] Page 4 of 10 Prim Dressing: VAC ary 3 x Per Week/30 Days Wound #3 - Calcaneus Wound Laterality: Right Prim Dressing: Maxorb Extra Calcium Alginate, 2x2 (in/in) 1 x Per Day/30 Days ary Discharge Instructions: Apply to wound bed if santyl unavailable Prim Dressing: Santyl Ointment 1 x Per Day/30 Days ary Discharge Instructions: Apply nickel thick amount to wound bed as instructed Secondary Dressing: ALLEVYN Heel 4 1/2in x 5 1/2in / 10.5cm x 13.5cm 1 x Per Day/30 Days Discharge Instructions: Apply over primary dressing as directed. Secondary Dressing: Woven Gauze Sponge, Non-Sterile 4x4 in 1 x Per Day/30 Days Discharge Instructions: Apply over primary dressing as directed. Secured With: [employer], 4.5x3.1 (in/yd) 1 x Per Day/30 Days Discharge Instructions: Secure with Kerlix as directed. Secured With: Paper Tape, 2x10 (in/yd) 1 x Per Day/30 Days Discharge Instructions: Secure dressing with tape as directed. Electronic Signature(s) Signed: 12/13/2022 4:34:50 PM By: Duanne Guess MD FACS Entered By: Duanne Guess on 12/13/2022 14:58:01 -------------------------------------------------------------------------------- Problem List Details Patient  Name: Date of Service: [name] 12/13/2022 2:00 PM Medical Record Number: [id number] Patient Account Number: [id number] Date of Birth/Sex: Treating RN: 1950/09/19 (72 y.o. [name] Primary Care Provider: Vincent Gros Other Clinician: Referring Provider: Treating Provider/Extender: Greer Pickerel in Treatment: 13 Active Problems ICD-10 Encounter Code Description Active Date MDM Diagnosis L89.314 Pressure ulcer of right buttock, stage 4 09/07/2022 No Yes L89.613 Pressure ulcer of right heel, stage 3 09/28/2022 No Yes I73.00 Raynaud's syndrome without gangrene 09/07/2022 No Yes I50.32 Chronic  diastolic (congestive) heart failure 09/07/2022 No Yes N18.32 Chronic kidney disease, stage 3b 09/07/2022 No Yes R73.02 Impaired glucose tolerance (oral) 09/07/2022 No Yes E66.9 Obesity, unspecified 09/07/2022 No Yes Inactive Problems MORA, PEDRAZA ([id number]) [id number] Page 5 of 10 ICD-10 Code Description Active Date Inactive Date L89.313 Pressure ulcer of right buttock, stage 3 09/07/2022 09/07/2022 Resolved Problems Electronic Signature(s) Signed: 12/13/2022 2:53:32 PM By: Duanne Guess MD FACS Entered By: Duanne Guess on 12/13/2022 14:53:32 -------------------------------------------------------------------------------- Progress Note Details Patient Name: Date of Service: [name] 12/13/2022 2:00 PM Medical Record Number: [id number] Patient Account Number: [id number] Date of Birth/Sex: Treating RN: 09-03-1950 (72 y.o. F) Primary Care Provider: Vincent Gros Other Clinician: Referring Provider: Treating Provider/Extender: Greer Pickerel in Treatment: 13 Subjective Chief Complaint Information obtained from Patient Patient is at the clinic for treatment of open pressure ulcers History of Present Illness (HPI) ADMISSION 09/07/2023 This is a 72 year old woman with a past medical  history notable for obesity, congestive heart failure, Raynaud's syndrome, CKD stage IIIb, osteoporosis, and rheumatoid arthritis. In November 2023, she suffered a fall that resulted in a femur fracture. She was hospitalized for about a week and underwent surgical repair of the fracture. She subsequently developed pressure ulcers on her right buttocks and ischium. She has been receiving home health services and they have been applying Medihoney. They have been reporting the wounds as stage II, but on evaluation, the large ulcer was probably unstageable at the time they were evaluating it but it is clearly a stage IV at this point. The smaller ulcer does have fat layer exposure and therefore is a stage III. The patient is accompanied by her daughter. She says she has been sleeping on a regular bed but recently ordered an air mattress T opper, but does not have it yet. She is on Ozempic for weight loss and therefore has a poor appetite and struggles to get adequate protein intake. 09/15/2022: The large stage IV ulcer is substantially cleaner this week. The stage III ulcer is a little smaller. Both have slough accumulation. The culture that I took last week was polymicrobial. The Augmentin that I prescribed was adequate coverage for the species and she continues to take this. We have also ordered Keystone topical antibiotic compound, but this has not yet arrived. 09/21/2022: The stage IV ulcer has some necrotic muscle at the base but is otherwise fairly clean. The stage III ulcer is smaller again with light slough on the surface. She has her Keystone topical antibiotic compound with her today. 09/29/2022: There is still some necrotic muscle at the base of the stage IV ulcer that I was unable to get to last week. The stage III ulcer has healed. She unfortunately has developed a new pressure ulcer on her right heel. 10/06/2022: Still with some devitalized muscle at the base of the stage IV ulcer, but otherwise  this wound is looking quite clean. The pressure induced tissue injury on her heel is demarcating and much of the area appears to be epithelialized, but there is still 2 areas that remain questionable. 10/12/2022: The stage IV ulcer is very clean and ready for wound VAC, which will be delivered tomorrow according to the patient. The tissue injury on her heel is drying up and does not feel particularly boggy this week. 10/19/2022: The heel injury continues to improve. There is still some dry eschar present on the more plantar aspect of it. There is some slough accumulation on the surface of the stage IV  pressure ulcer. The wound VAC was initiated by home health last week. 11/01/2022: The gluteal pressure ulcer is very clean without any necrotic tissue or slough. The depth has come in by over half a centimeter. The deep tissue injury on her heel continues to improve. There is still dry eschar that we are painting with Betadine as well as some fresh-looking viable tissue at the margin. 11/15/2022: The gluteal pressure ulcer is clean and contracting. The eschar on her heel ulcer is beginning to separate from the underlying tissue. 11/29/2022: The gluteal pressure ulcer is very clean. The depth has come in by about a centimeter. The eschar has completely separated off of her heel. There is some fibrinous exudate and slough on the heel. It has declared itself as a stage III at this point. 12/13/2022: The heel ulcer is smaller and much cleaner, but still has some non-viable tissue present. The orifice of the gluteal ulcer is contracting, but the depth remains the same. Fortunately, the sponge for the [facility] is being packed appropriately into the full depth of the wound. Patient History Information obtained from Patient, Caregiver, Chart. Family History Cancer - Father,Paternal Grandparents, Heart Disease - Mother,Father,Paternal Grandparents, Hypertension - Mother, Stroke - Maternal Grandparents, No family history of  Diabetes, Hereditary Spherocytosis, Kidney Disease, Lung Disease, Seizures, Thyroid Problems, Tuberculosis. 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 Medical History OLUWATOBI, RUPPE ([id number]) [phone] Page 6 of 10 Eyes Denies history of Cataracts, Glaucoma, Optic Neuritis Ear/Nose/Mouth/Throat Patient has history of Chronic sinus problems/congestion - chronic rhinitis Denies history of Middle ear problems Respiratory Patient has history of Sleep Apnea - uses CPAP Cardiovascular Patient has history of Congestive Heart Failure Endocrine Denies history of Type I Diabetes, Type II Diabetes Genitourinary Denies history of End Stage Renal Disease Immunological Patient has history of Raynaudoos Denies history of Lupus Erythematosus, Scleroderma Integumentary (Skin) Denies history of History of Burn Musculoskeletal Patient has history of Rheumatoid Arthritis Denies history of Gout, Osteoarthritis, Osteomyelitis Neurologic Patient has history of Neuropathy Denies history of Dementia, Quadriplegia, Paraplegia, Seizure Disorder Oncologic Denies history of Received Chemotherapy, Received Radiation Psychiatric Denies history of Anorexia/bulimia, Confinement Anxiety Hospitalization/Surgery History - right femur fx ORIR. - right knee replacement. - left breast mass excision. - left ankle tibia repair. - abdominal hysterectomy. - adnoidectomy/tonsillectomy. - cholecystectomy. - dental implants. - patoid cystectomy. - tubal ligation. Medical A Surgical History Notes nd Constitutional Symptoms (General Health) morbid obesity Cardiovascular hyperlipidemia Gastrointestinal GERD, IBS, eosinophilic esophagitis, diverticulosis, Genitourinary CKD stage 3 Immunological Sjoegren syndrome, fibromyalgia Objective Constitutional no acute distress. Vitals Time  Taken: 2:15 PM, Height: 62 in, Weight: 207 lbs, BMI: 37.9, Temperature: 98.3 F, Pulse: 71 bpm, Respiratory Rate: 18 breaths/min, Blood Pressure: 126/76 mmHg. Respiratory Normal work of breathing on room air. General Notes: 12/13/2022: The heel ulcer is smaller and much cleaner, but still has some non-viable tissue present. The orifice of the gluteal ulcer is contracting, but the depth remains the same. Integumentary (Hair, Skin) Wound #1 status is Open. Original cause of wound was Pressure Injury. The date acquired was: 08/11/2022. The wound has been in treatment 13 weeks. The wound is located on the Right Gluteus. The wound measures 1.8cm length x 3.5cm width x 4.7cm depth; 4.948cm^2 area and 23.256cm^3 volume. There is Fat Layer (Subcutaneous Tissue) exposed. There is no tunneling noted, however, there is undermining starting at 12:00 and ending at 2:00 with  a maximum distance of 9.5cm. There is a large amount of serosanguineous drainage noted. The wound margin is well defined and not attached to the wound base. There is large (67-100%) red, pale granulation within the wound bed. There is a small (1-33%) amount of necrotic tissue within the wound bed including Adherent Slough. The periwound skin appearance had no abnormalities noted for texture. The periwound skin appearance had no abnormalities noted for moisture. The periwound skin appearance had no abnormalities noted for color. Periwound temperature was noted as No Abnormality. The periwound has tenderness on palpation. Wound #3 status is Open. Original cause of wound was Pressure Injury. The date acquired was: 09/21/2022. The wound has been in treatment 10 weeks. The wound is located on the Right Calcaneus. The wound measures 2.4cm length x 4.2cm width x 0.5cm depth; 7.917cm^2 area and 3.958cm^3 volume. There is Fat Layer (Subcutaneous Tissue) exposed. There is no tunneling or undermining noted. There is a medium amount of purulent drainage  noted. The wound margin is flat and intact. There is medium (34-66%) pink granulation within the wound bed. There is a medium (34-66%) amount of necrotic tissue within the wound bed including Adherent Slough. The periwound skin appearance had no abnormalities noted for texture. The periwound skin appearance had no abnormalities noted for color. The periwound skin appearance did not exhibit: Dry/Scaly, Maceration. Periwound temperature was noted as No Abnormality. The periwound has tenderness on palpation. ROSLAND, RIDING ([id number]) [id number] Page 7 of 10 Assessment Active Problems ICD-10 Pressure ulcer of right buttock, stage 4 Pressure ulcer of right heel, stage 3 Raynaud's syndrome without gangrene Chronic diastolic (congestive) heart failure Chronic kidney disease, stage 3b Impaired glucose tolerance (oral) Obesity, unspecified Procedures Wound #3 Pre-procedure diagnosis of Wound #3 is a Pressure Ulcer located on the Right Calcaneus . There was a Excisional Skin/Subcutaneous Tissue Debridement with a total area of 7.91 sq cm performed by Duanne Guess, MD. With the following instrument(s): Curette to remove Viable and Non-Viable tissue/material. Material removed includes Subcutaneous Tissue and Slough and after achieving pain control using Lidocaine 4% T opical Solution. No specimens were taken. A time out was conducted at 14:40, prior to the start of the procedure. A Minimum amount of bleeding was controlled with Pressure. The procedure was tolerated well with a pain level of 0 throughout and a pain level of 0 following the procedure. Post Debridement Measurements: 2.4cm length x 4.2cm width x 0.5cm depth; 3.958cm^3 volume. Post debridement Stage noted as Category/Stage III. Character of Wound/Ulcer Post Debridement is improved. Post procedure Diagnosis Wound #3: Same as Pre-Procedure General Notes: Scribed for Dr Lady Gary by Zenaida Deed,  RN. Plan Follow-up Appointments: Return Appointment in 2 weeks. - Dr. Lady Gary RM 1 Wednesday 5/15 @ 2:00 pm Anesthetic: Wound #1 Right Gluteus: (In clinic) Topical Lidocaine 4% applied to wound bed Wound #3 Right Calcaneus: (In clinic) Topical Lidocaine 4% applied to wound bed Bathing/ Shower/ Hygiene: May shower and wash wound with soap and water. Negative Presssure Wound Therapy: Wound #1 Right Gluteus: Wound Vac to wound continuously at 14mm/hg pressure Black Foam Off-Loading: Heel suspension boot to: - globoped right foot to ambulate Low air-loss mattress (Group 2) - Adapt Turn and reposition every 2 hours - avoid lying on back, stand at least every hour while out of bed, float heels off bed with pillows under calves while in bed Prevalon Boot - right foot esp. while in bed. Additional Orders / Instructions: Follow Nutritious Diet - increase protein to 70-80 gms per  day, hold ozempic Juven Shake 1-2 times daily. Home Health: No change in wound care orders this week; continue Home Health for wound care. May utilize formulary equivalent dressing for wound treatment orders unless otherwise specified. Other Home Health Orders/Instructions: - [facility] WOUND #1: - Gluteus Wound Laterality: Right Cleanser: Wound Cleanser 3 x Per Week/30 Days Discharge Instructions: Cleanse the wound with wound cleanser prior to applying a clean dressing using gauze sponges, not tissue or cotton balls. Prim Dressing: Promogran Prisma Matrix, 4.34 (sq in) (silver collagen) 3 x Per Week/30 Days ary Discharge Instructions: Moisten collagen with saline or hydrogel and place at base of wound under VAC sponge Prim Dressing: VAC 3 x Per Week/30 Days ary WOUND #3: - Calcaneus Wound Laterality: Right Prim Dressing: Maxorb Extra Calcium Alginate, 2x2 (in/in) 1 x Per Day/30 Days ary Discharge Instructions: Apply to wound bed if santyl unavailable Prim Dressing: Santyl Ointment 1 x Per Day/30  Days ary Discharge Instructions: Apply nickel thick amount to wound bed as instructed Secondary Dressing: ALLEVYN Heel 4 1/2in x 5 1/2in / 10.5cm x 13.5cm 1 x Per Day/30 Days Discharge Instructions: Apply over primary dressing as directed. Secondary Dressing: Woven Gauze Sponge, Non-Sterile 4x4 in 1 x Per Day/30 Days Discharge Instructions: Apply over primary dressing as directed. Secured With: [employer], 4.5x3.1 (in/yd) 1 x Per Day/30 Days Discharge Instructions: Secure with Kerlix as directed. Secured With: Paper T ape, 2x10 (in/yd) 1 x Per Day/30 Days Discharge Instructions: Secure dressing with tape as directed. 12/13/2022: The heel ulcer is smaller and much cleaner, but still has some non-viable tissue present. The orifice of the gluteal ulcer is contracting, but the depth remains the same. JASLEN, ADCO[employer] ([id number]) [id number] Page 8 of 10 I used a curette to debride slough and subcutaneous tissue from the heel ulcer. We will continue Santyl and silver alginate with a heel cup. She will continue to offload and float this heel. No debridement was necessary for the gluteal ulcer. Continue Prisma silver collagen at the base with negative pressure wound therapy. Continue offloading. Follow-up in 2 weeks. Electronic Signature(s) Signed: 12/13/2022 4:37:56 PM By: Duanne Guess MD FACS Signed: 12/13/2022 4:44:16 PM By: Zenaida Deed RN, BSN Previous Signature: 12/13/2022 2:58:47 PM Version By: Duanne Guess MD FACS Entered By: Zenaida Deed on 12/13/2022 16:37:26 -------------------------------------------------------------------------------- HxROS Details Patient Name: Date of Service: [name] 12/13/2022 2:00 PM Medical Record Number: [id number] Patient Account Number: [id number] Date of Birth/Sex: Treating RN: 05/07/1951 (72 y.o. F) Primary Care Provider: Vincent Gros Other Clinician: Referring Provider: Treating Provider/Extender:  Greer Pickerel in Treatment: 13 Information Obtained From Patient Caregiver Chart Constitutional Symptoms (General Health) Medical History: Past Medical History Notes: morbid obesity Eyes Medical History: Negative for: Cataracts; Glaucoma; Optic Neuritis Ear/Nose/Mouth/Throat Medical History: Positive for: Chronic sinus problems/congestion - chronic rhinitis Negative for: Middle ear problems Respiratory Medical History: Positive for: Sleep Apnea - uses CPAP Cardiovascular Medical History: Positive for: Congestive Heart Failure Past Medical History Notes: hyperlipidemia Gastrointestinal Medical History: Past Medical History Notes: GERD, IBS, eosinophilic esophagitis, diverticulosis, Endocrine Medical History: Negative for: Type I Diabetes; Type II Diabetes Genitourinary Medical History: Negative for: End Stage Renal Disease Past Medical History Notes: CKD stage 3 Immunological Medical History: Positive for: Raynauds Negative for: Lupus Erythematosus; Scleroderma Past Medical History NotesROSALYNN, SERGENT ([id number]) [id number] Page 9 of 10 Sjoegren syndrome, fibromyalgia Integumentary (Skin) Medical History: Negative for: History of Burn Musculoskeletal Medical History: Positive for: Rheumatoid Arthritis Negative for: Gout; Osteoarthritis;  Osteomyelitis Neurologic Medical History: Positive for: Neuropathy Negative for: Dementia; Quadriplegia; Paraplegia; Seizure Disorder Oncologic Medical History: Negative for: Received Chemotherapy; Received Radiation Psychiatric Medical History: Negative for: Anorexia/bulimia; Confinement Anxiety HBO Extended History Items Ear/Nose/Mouth/Throat: Chronic sinus problems/congestion Immunizations Pneumococcal Vaccine: Received Pneumococcal Vaccination: Yes Received Pneumococcal Vaccination On or After 60th Birthday: Yes Implantable Devices None Hospitalization /  Surgery History Type of Hospitalization/Surgery right femur fx ORIR right knee replacement left breast mass excision left ankle tibia repair abdominal hysterectomy adnoidectomy/tonsillectomy cholecystectomy dental implants patoid cystectomy tubal ligation Family and Social History Cancer: Yes - Father,Paternal Grandparents; Diabetes: No; Heart Disease: Yes - Mother,Father,Paternal Grandparents; Hereditary Spherocytosis: No; Hypertension: Yes - Mother; Kidney Disease: No; Lung Disease: No; Seizures: No; Stroke: Yes - Maternal Grandparents; Thyroid Problems: No; Tuberculosis: No; Never smoker; Marital Status - Widowed; Alcohol Use: Rarely; Drug Use: Prior History - TCH; Caffeine Use: Daily - soda, tea; Financial Concerns: No; Food, Clothing or Shelter Needs: No; Support System Lacking: No; Transportation Concerns: No Electronic Signature(s) Signed: 12/13/2022 4:34:50 PM By: Duanne Guess MD FACS Entered By: Duanne Guess on 12/13/2022 14:57:22 -------------------------------------------------------------------------------- SuperBill Details Patient Name: Date of Service: [name] 12/13/2022 Medical Record Number: [id number] Patient Account Number: [id number] Date of Birth/Sex: Treating RN: July 11, 1951 (72 y.o. F) Primary Care Provider: Vincent Gros Other Clinician: Referring Provider: Treating Provider/Extender: Carolin Coy Elsberry, [name] ([id number]) [id number] Page 10 of 10 Weeks in Treatment: 13 Diagnosis Coding ICD-10 Codes Code Description L89.314 Pressure ulcer of right buttock, stage 4 L89.613 Pressure ulcer of right heel, stage 3 I73.00 Raynaud's syndrome without gangrene I50.32 Chronic diastolic (congestive) heart failure N18.32 Chronic kidney disease, stage 3b R73.02 Impaired glucose tolerance (oral) E66.9 Obesity, unspecified Facility Procedures : CPT4 Code: 95284132 Description: 11042 - DEB SUBQ TISSUE  20 SQ CM/< ICD-10 Diagnosis Description L89.613 Pressure ulcer of right heel, stage 3 Modifier: Quantity: 1 : CPT4 Code: 44010272 Description: 53664 - WOUND VAC-50 SQ CM OR LESS Modifier: Quantity: 1 Physician Procedures : CPT4 Code Description Modifier [id number] 99214 - WC PHYS LEVEL 4 - EST PT 25 ICD-10 Diagnosis Description L89.314 Pressure ulcer of right buttock, stage 4 L89.613 Pressure ulcer of right heel, stage 3 I50.32 Chronic diastolic (congestive) heart failure  N18.32 Chronic kidney disease, stage 3b Quantity: 1 : [id number] 11042 - WC PHYS SUBQ TISS 20 SQ CM ICD-10 Diagnosis Description L89.613 Pressure ulcer of right heel, stage 3 Quantity: 1 Electronic Signature(s) Signed: 12/13/2022 2:59:10 PM By: Duanne Guess MD FACS Entered By: Duanne Guess on 12/13/2022 14:59:10

## 2022-12-14 NOTE — Telephone Encounter (Signed)
Morah from [facility] calling to report that pt had a fall today, did not hit her head but reports soreness.  Morah did a fall assessment and went over precautions to seek help.  She said that pt would be with family tonight and just wanted to update provider.

## 2022-12-14 NOTE — Progress Notes (Signed)
STEPHANEY, STEVEN ([id number]) [id number] Page 1 of 9 Visit Report for 12/13/2022 Arrival Information Details Patient Name: Date of Service: [name], [name] 12/13/2022 2:00 PM Medical Record Number: [id number] Patient Account Number: [id number] Date of Birth/Sex: Treating RN: 1951-06-18 (72 y.o. [name] Primary Care Marshawn Ninneman: Vincent Gros Other Clinician: Referring Luca Burston: Treating Kymoni Lesperance/Extender: Greer Pickerel in Treatment: 13 Visit Information History Since Last Visit Added or deleted any medications: No Patient Arrived: Walker Any new allergies or adverse reactions: No Arrival Time: 14:14 Had a fall or experienced change in No Accompanied By: daughter activities of daily living that may affect Transfer Assistance: None risk of falls: Patient Identification Verified: Yes Signs or symptoms of abuse/neglect since last visito No Secondary Verification Process Completed: Yes Hospitalized since last visit: No Patient Requires Transmission-Based Precautions: No Implantable device outside of the clinic excluding No Patient Has Alerts: No cellular tissue based products placed in the center since last visit: Has Dressing in Place as Prescribed: Yes Pain Present Now: No Electronic Signature(s) Signed: 12/13/2022 4:44:16 PM By: Zenaida Deed RN, BSN Entered By: Zenaida Deed on 12/13/2022 14:15:26 -------------------------------------------------------------------------------- Encounter Discharge Information Details Patient Name: Date of Service: Marisa Roux Cooper. 12/13/2022 2:00 PM Medical Record Number: [id number] Patient Account Number: [id number] Date of Birth/Sex: Treating RN: [birth year] (72 y.o. [name] Primary Care Tannia Contino: Vincent Gros Other Clinician: Referring Daanish Copes: Treating Taevion Sikora/Extender: Greer Pickerel in Treatment: 13 Encounter Discharge Information Items  Post Procedure Vitals Discharge Condition: Stable Temperature (F): 98.3 Ambulatory Status: Walker Pulse (bpm): 71 Discharge Destination: Home Respiratory Rate (breaths/min): 18 Transportation: Private Auto Blood Pressure (mmHg): 126/76 Accompanied By: daughter Schedule Follow-up Appointment: Yes Clinical Summary of Care: Patient Declined Electronic Signature(s) Signed: 12/13/2022 4:44:16 PM By: Zenaida Deed RN, BSN Entered By: Zenaida Deed on 12/13/2022 16:39:04 -------------------------------------------------------------------------------- Lower Extremity Assessment Details Patient Name: Date of Service: Marisa Kathie Rhodes Cooper. 12/13/2022 2:00 PM Medical Record Number: [id number] Patient Account Number: [id number] Date of Birth/Sex: Treating RN: 12-26-1950 (72 y.o. [name] Primary Care Kuba Shepherd: Vincent Gros Other Clinician: Referring Jye Fariss: Treating Emmah Bratcher/Extender: Greer Pickerel in Treatment: 13 Edema Assessment Assessed: [name]: No] [name]: No] C[LeftMADYSYN, [name] ([id number]: [id number] Page 2 of 9] Edema: [Left: Ye] [Right: s] Calf Left: Right: Point of Measurement: From Medial Instep 41 cm Ankle Left: Right: Point of Measurement: From Medial Instep 25 cm Vascular Assessment Pulses: Dorsalis Pedis Palpable: [Right:Yes] Electronic Signature(s) Signed: 12/13/2022 4:44:16 PM By: Zenaida Deed RN, BSN Entered By: Zenaida Deed on 12/13/2022 14:34:55 -------------------------------------------------------------------------------- Multi Wound Chart Details Patient Name: Date of Service: [name]. 12/13/2022 2:00 PM Medical Record Number: [id number] Patient Account Number: [id number] Date of Birth/Sex: Treating RN: 1951-07-11 (72 y.o. F) Primary Care Karly Pitter: Vincent Gros Other Clinician: Referring Verlyn Lambert: Treating Saisha Hogue/Extender: Greer Pickerel in  Treatment: 13 Vital Signs Height(in): 62 Pulse(bpm): 71 Weight(lbs): 207 Blood Pressure(mmHg): 126/76 Body Mass Index(BMI): 37.9 Temperature(F): 98.3 Respiratory Rate(breaths/min): 18 [1:Photos: No Photos Right Gluteus Wound Location: Pressure Injury Wounding Event: Pressure Ulcer Primary Etiology: Chronic sinus problems/congestion, Chronic sinus problems/congestion, N/A Comorbid History: Sleep Apnea, Congestive Heart Failure, Raynauds,  Rheumatoid Arthritis, Neuropathy 08/11/2022 Date Acquired: 13 Weeks of Treatment: Open Wound Status: No Wound Recurrence: 1.8x3.5x4.7 Measurements L x W x D (cm) 4.948 A (cm) : rea 23.256 Volume (cm) : 77.50% % Reduction in A rea: 82.40% % Reduction in  Volume: 12 Starting Position 1 (o'clock): 2 Ending Position 1 (o'clock):  9.5 Maximum Distance 1 (cm): Yes Undermining: Category/Stage IV Classification: Large Exudate A mount: Serosanguineous Exudate Type: red, brown Exudate Color: Well defined, not  attached Wound Margin: Large (67-100%) Granulation A mount: Red, Pale Granulation Quality: Small (1-33%) Necrotic A mount: Fat Layer (Subcutaneous Tissue): Yes Fat Layer (Subcutaneous Tissue): Yes N/A Exposed Structures: Fascia: No Tendon: No Muscle: No  Joint: No Bone: No] [3:No Photos Right Calcaneus Pressure Injury Pressure Ulcer Sleep Apnea, Congestive Heart Failure, Raynauds, Rheumatoid Arthritis, Neuropathy /03/2023 10 Open No .4x4.2x0.5 7.917 3.958 18.00% -309.70% No Category/Stage III Medium  Purulent yellow, brown, green Flat and Intact Medium (34-66%) Pink Medium (34-66%) Fascia: No Tendon: No Muscle: No Joint: No Bone: No] [N/A:N/A N/A N/A N/A N/A N/A N/A N/A N/A N/A N/A N/A N/A N/A N/A N/A N/A N/A N/A N/A N/A N/A] JAELIE, AGUILERA ([id number]) [1:None Epithelialization: N/A Debridement: N/A Pre-procedure Verification/Time Out Taken: N/A Pain Control: N/A Tissue Debrided: N/A Level: N/A Debridement A (sq cm): rea N/A Instrument: N/A Bleeding: N/A Hemostasis A  chieved: N/A Procedural Pain: N/A  Post Procedural Pain: N/A Debridement Treatment Response: N/A Post Debridement Measurements L x W x D (cm) N/A Post Debridement Volume: (cm) N/A Post Debridement Stage: Excoriation: No Periwound Skin Texture: Induration: No Callus: No Crepitus: No Rash:  No Scarring: No Maceration: No Periwound Skin Moisture: Dry/Scaly: No Atrophie Blanche: No Periwound Skin Color: Cyanosis: No Ecchymosis: No Erythema: No Hemosiderin Staining: No Mottled: No Pallor: No Rubor: No No Abnormality Temperature: Yes  Tenderness on Palpation: Negative Pressure Wound Therapy Procedures Performed: Maintenance (NPWT)] [3:Small (1-33%) Debridement - Excisional 14:40 Lidocaine 4% Topical Solution Subcutaneous, Slough Skin/Subcutaneous Tissue 7.91 Curette Minimum Pressure 0  0 Procedure was tolerated well 2.4x4.2x0.5 3.958 Category/Stage III No Abnormalities Noted Maceration: No Dry/Scaly: No No Abnormalities Noted No Abnormality Yes Debridement] [N/A:[id number] Page 3 of 9 N/A N/A N/A N/A N/A N/A  N/A N/A N/A N/A N/A N/A N/A N/A N/A N/A N/A N/A N/A N/A N/A N/A] Treatment Notes Electronic Signature(s) Signed: 12/13/2022 2:53:37 PM By: Duanne Guess MD FACS Entered By: Duanne Guess on 12/13/2022 14:53:37 -------------------------------------------------------------------------------- Multi-Disciplinary Care Plan Details Patient Name: Date of Service: [name]. 12/13/2022 2:00 PM Medical Record Number: [id number] Patient Account Number: [id number] Date of Birth/Sex: Treating RN: 1951-04-01 (72 y.o. [name] Primary Care Ophie Burrowes: Vincent Gros Other Clinician: Referring Zarah Carbon: Treating Tenicia Gural/Extender: Greer Pickerel in Treatment: 13 Multidisciplinary Care Plan reviewed with physician Active Inactive Pressure Nursing Diagnoses: Knowledge deficit related to management of pressures ulcers Goals: Patient will  remain free of pressure ulcers Date Initiated: 09/15/2022 Date Inactivated: 09/28/2022 Target Resolution Date: 11/09/2022 Unmet Reason: new pressure ulcer Goal Status: Unmet right heel Patient/caregiver will verbalize risk factors for pressure ulcer development Date Initiated: 09/15/2022 Target Resolution Date: 01/12/2023 Goal Status: Active Interventions: Assess potential for pressure ulcer upon admission and as needed CHANTAE, SOO ([id number]) [id number] Page 4 of 9 Treatment Activities: Patient referred for pressure reduction/relief devices : 09/15/2022 Pressure reduction/relief device ordered : 09/15/2022 Notes: Wound/Skin Impairment Nursing Diagnoses: Impaired tissue integrity Knowledge deficit related to ulceration/compromised skin integrity Goals: Patient/caregiver will verbalize understanding of skin care regimen Date Initiated: 09/28/2022 Target Resolution Date: 01/04/2023 Goal Status: Active Ulcer/skin breakdown will have a volume reduction of 50% by week 8 Date Initiated: 09/28/2022 Date Inactivated: 11/01/2022 Target Resolution Date: 11/02/2022 Goal Status: Met Ulcer/skin breakdown will have a volume reduction of 80% by week 12 Date Initiated: 11/01/2022 Date Inactivated: 11/29/2022 Target Resolution Date: 11/29/2022 Unmet Reason:  vac in place, still Goal Status: Unmet debriding heel Interventions: Assess patient/caregiver ability to obtain necessary supplies Assess patient/caregiver ability to perform ulcer/skin care regimen upon admission and as needed Assess ulceration(s) every visit Provide education on ulcer and skin care Treatment Activities: Skin care regimen initiated : 09/28/2022 Topical wound management initiated : 09/28/2022 Notes: Electronic Signature(s) Signed: 12/13/2022 4:44:16 PM By: Zenaida Deed RN, BSN Entered By: Zenaida Deed on 12/13/2022  14:35:23 -------------------------------------------------------------------------------- Negative Pressure Wound Therapy Maintenance (NPWT) Details Patient Name: Date of Service: [name], [name] 12/13/2022 2:00 PM Medical Record Number: [id number] Patient Account Number: [id number] Date of Birth/Sex: Treating RN: [birth year] (72 y.o. [name] Primary Care Chandlor Noecker: Vincent Gros Other Clinician: Referring Jahvier Aldea: Treating Brelyn Woehl/Extender: Greer Pickerel in Treatment: 13 NPWT Maintenance Performed for: Wound #1 Right Gluteus Additional Injuries Covered: No Performed By: Zenaida Deed, RN Type: VAC System Coverage Size (sq cm): 6.3 Pressure Type: Constant Pressure Setting: 125 mmHG Drain Type: None Primary Contact: Other : prisma Sponge/Dressing Type: Foam- Black Date Initiated: 10/16/2022 Dressing Removed: Yes Quantity of Sponges/Gauze Removed: 1 Canister Changed: No Dressing Reapplied: Yes Quantity of Sponges/Gauze Inserted: 1 Respones T Treatment: o good Days On NPWT : 59 Post Procedure Diagnosis Same as Pre-procedure Electronic Signature(s) REIZY, DUNLOW Cooper ([id number]) [id number] Page 5 of 9 Signed: 12/13/2022 4:44:16 PM By: Zenaida Deed RN, BSN Entered By: Zenaida Deed on 12/13/2022 14:46:56 -------------------------------------------------------------------------------- Pain Assessment Details Patient Name: Date of Service: Marisa [name], [name] 12/13/2022 2:00 PM Medical Record Number: [id number] Patient Account Number: [id number] Date of Birth/Sex: Treating RN: 04/08/1951 (72 y.o. [name] Primary Care Hilberto Burzynski: Vincent Gros Other Clinician: Referring Titus Drone: Treating Syanne Looney/Extender: Greer Pickerel in Treatment: 13 Active Problems Location of Pain Severity and Description of Pain Patient Has Paino No Site Locations Rate the pain. Current Pain Level:  0 Character of Pain Describe the Pain: Tender Pain Management and Medication Current Pain Management: Electronic Signature(s) Signed: 12/13/2022 4:44:16 PM By: Zenaida Deed RN, BSN Entered By: Zenaida Deed on 12/13/2022 14:16:31 -------------------------------------------------------------------------------- Patient/Caregiver Education Details Patient Name: Date of Service: [name] 5/1/2024andnbsp2:00 PM Medical Record Number: [id number] Patient Account Number: [id number] Date of Birth/Gender: Treating RN: Mar 07, 1951 (72 y.o. [name] Primary Care Physician: Vincent Gros Other Clinician: Referring Physician: Treating Physician/Extender: Greer Pickerel in Treatment: 13 Education Assessment Education Provided To: Patient Education Topics Provided Pressure: Methods: Explain/Verbal Responses: Reinforcements needed, State content correctly Wound/Skin ImpairmentROZELLE, [name] ([id number]) [id number] Page 6 of 9 Methods: Explain/Verbal Responses: Reinforcements needed, State content correctly Electronic Signature(s) Signed: 12/13/2022 4:44:16 PM By: Zenaida Deed RN, BSN Entered By: Zenaida Deed on 12/13/2022 14:35:44 -------------------------------------------------------------------------------- Wound Assessment Details Patient Name: Date of Service: Marisa Roux Cooper. 12/13/2022 2:00 PM Medical Record Number: [id number] Patient Account Number: [id number] Date of Birth/Sex: Treating RN: Mar 29, 1951 (72 y.o. [name] Primary Care Justus Droke: Vincent Gros Other Clinician: Referring Damesha Lawler: Treating Cleota Pellerito/Extender: Greer Pickerel in Treatment: 13 Wound Status Wound Number: 1 Primary Pressure Ulcer Etiology: Wound Location: Right Gluteus Wound Open Wounding Event: Pressure Injury Status: Date Acquired: 08/11/2022 Comorbid Chronic sinus problems/congestion,  Sleep Apnea, Congestive Heart Weeks Of Treatment: 13 History: Failure, Raynauds, Rheumatoid Arthritis, Neuropathy Clustered Wound: No Photos Wound Measurements Length: (cm) 1.8 Width: (cm) 3.5 Depth: (cm) 4.7 Area: (cm) 4.948 Volume: (cm) 23.256 % Reduction in Area: 77.5% % Reduction in Volume: 82.4% Epithelialization: None Tunneling: No Undermining: Yes Starting Position (o'clock): 12 Ending Position (o'clock):  2 Maximum Distance: (cm) 9.5 Wound Description Classification: Category/Stage IV Wound Margin: Well defined, not attached Exudate Amount: Large Exudate Type: Serosanguineous Exudate Color: red, brown Foul Odor After Cleansing: No Slough/Fibrino Yes Wound Bed Granulation Amount: Large (67-100%) Exposed Structure Granulation Quality: Red, Pale Fascia Exposed: No Necrotic Amount: Small (1-33%) Fat Layer (Subcutaneous Tissue) Exposed: Yes Necrotic Quality: Adherent Slough Tendon Exposed: No Muscle Exposed: No Joint Exposed: No Bone Exposed: No Periwound Skin Texture Texture Color No Abnormalities Noted: Yes No Abnormalities Noted: Yes Moisture Temperature / Pain [name], [name] ([id number]) [id number] Page 7 of 9 No Abnormalities Noted: Yes Temperature: No Abnormality Tenderness on Palpation: Yes Treatment Notes Wound #1 (Gluteus) Wound Laterality: Right Cleanser Wound Cleanser Discharge Instruction: Cleanse the wound with wound cleanser prior to applying a clean dressing using gauze sponges, not tissue or cotton balls. Peri-Wound Care Topical Primary Dressing Promogran Prisma Matrix, 4.34 (sq in) (silver collagen) Discharge Instruction: Moisten collagen with saline or hydrogel and place at base of wound under VAC sponge VAC Secondary Dressing Secured With Compression Wrap Compression Stockings Add-Ons Electronic Signature(s) Signed: 12/13/2022 4:44:16 PM By: Zenaida Deed RN, BSN Entered By: Zenaida Deed on 12/13/2022  16:36:39 -------------------------------------------------------------------------------- Wound Assessment Details Patient Name: Date of Service: Marisa Roux Cooper. 12/13/2022 2:00 PM Medical Record Number: [id number] Patient Account Number: [id number] Date of Birth/Sex: Treating RN: March 22, 1951 (72 y.o. [name] Primary Care Navdeep Fessenden: Vincent Gros Other Clinician: Referring Ayodele Hartsock: Treating Faustina Gebert/Extender: Greer Pickerel in Treatment: 13 Wound Status Wound Number: 3 Primary Pressure Ulcer Etiology: Wound Location: Right Calcaneus Wound Open Wounding Event: Pressure Injury Status: Date Acquired: 09/21/2022 Comorbid Chronic sinus problems/congestion, Sleep Apnea, Congestive Heart Weeks Of Treatment: 10 History: Failure, Raynauds, Rheumatoid Arthritis, Neuropathy Clustered Wound: No Photos Wound Measurements Length: (cm) 2.4 Width: (cm) 4.2 Depth: (cm) 0.5 Area: (cm) 7.917 Volume: (cm) 3.958 % Reduction in Area: 18% % Reduction in Volume: -309.7% Epithelialization: Small (1-33%) Tunneling: No Undermining: No Wound Description [name], [name] ([id number]) Classification: Category/Stage III Wound Margin: Flat and Intact Exudate Amount: Medium Exudate Type: Purulent Exudate Color: yellow, brown, green [id number] Page 8 of 9 Foul Odor After Cleansing: No Slough/Fibrino Yes Wound Bed Granulation Amount: Medium (34-66%) Exposed Structure Granulation Quality: Pink Fascia Exposed: No Necrotic Amount: Medium (34-66%) Fat Layer (Subcutaneous Tissue) Exposed: Yes Necrotic Quality: Adherent Slough Tendon Exposed: No Muscle Exposed: No Joint Exposed: No Bone Exposed: No Periwound Skin Texture Texture Color No Abnormalities Noted: Yes No Abnormalities Noted: Yes Moisture Temperature / Pain No Abnormalities Noted: No Temperature: No Abnormality Dry / Scaly: No Tenderness on Palpation: Yes Maceration:  No Treatment Notes Wound #3 (Calcaneus) Wound Laterality: Right Cleanser Peri-Wound Care Topical Primary Dressing Maxorb Extra Calcium Alginate, 2x2 (in/in) Discharge Instruction: Apply to wound bed if santyl unavailable Santyl Ointment Discharge Instruction: Apply nickel thick amount to wound bed as instructed Secondary Dressing ALLEVYN Heel 4 1/2in x 5 1/2in / 10.5cm x 13.5cm Discharge Instruction: Apply over primary dressing as directed. Woven Gauze Sponge, Non-Sterile 4x4 in Discharge Instruction: Apply over primary dressing as directed. Secured With [employer], 4.5x3.1 (in/yd) Discharge Instruction: Secure with Kerlix as directed. Paper Tape, 2x10 (in/yd) Discharge Instruction: Secure dressing with tape as directed. Compression Wrap Compression Stockings Add-Ons Electronic Signature(s) Signed: 12/13/2022 4:44:16 PM By: Zenaida Deed RN, BSN Entered By: Zenaida Deed on 12/13/2022 16:37:04 -------------------------------------------------------------------------------- Vitals Details Patient Name: Date of Service: Marisa Kathie Rhodes Cooper. 12/13/2022 2:00 PM Medical Record Number: [id number] Patient Account Number: [id number] Date of Birth/Sex:  Treating RN: [birth year] (72 y.o. [name] Primary Care Ashantia Amaral: Vincent Gros Other Clinician: Referring Keiland Pickering: Treating Arif Amendola/Extender: Greer Pickerel in Treatment: [address] [name], BLANDO Cooper ([id number]) [id number] Page 9 of 9 Time Taken: 14:15 Temperature (F): 98.3 Height (in): 62 Pulse (bpm): 71 Weight (lbs): 207 Respiratory Rate (breaths/min): 18 Body Mass Index (BMI): 37.9 Blood Pressure (mmHg): 126/76 Reference Range: 80 - 120 mg / dl Electronic Signature(s) Signed: 12/13/2022 4:44:16 PM By: Zenaida Deed RN, BSN Entered By: Zenaida Deed on 12/13/2022 14:16:20

## 2022-12-15 NOTE — Telephone Encounter (Signed)
Ok. Thank you.

## 2022-12-18 DIAGNOSIS — S72451D Displaced supracondylar fracture without intracondylar extension of lower end of right femur, subsequent encounter for closed fracture with routine healing: Secondary | ICD-10-CM | POA: Diagnosis not present

## 2022-12-19 ENCOUNTER — Telehealth: Payer: Self-pay | Admitting: *Deleted

## 2022-12-19 NOTE — Telephone Encounter (Signed)
LVM for pt to call office back to see about changing her AWV to a phone visit with AWV Team on a Tues/Thurs, also provider is out of office that week.

## 2022-12-21 ENCOUNTER — Other Ambulatory Visit: Payer: Self-pay | Admitting: Rheumatology

## 2022-12-21 ENCOUNTER — Other Ambulatory Visit: Payer: Self-pay | Admitting: Internal Medicine

## 2022-12-21 NOTE — Telephone Encounter (Signed)
Last Fill: 10/25/2022  Next Visit: 05/30/2023  Last Visit: 11/28/2022  Dx:  Rheumatoid arthritis involving multiple sites with positive rheumatoid factor   Current Dose per office note on 416/2024: not mentioned  Okay to refill phenergan?

## 2022-12-22 ENCOUNTER — Other Ambulatory Visit: Payer: Self-pay | Admitting: Cardiology

## 2022-12-26 DIAGNOSIS — G4733 Obstructive sleep apnea (adult) (pediatric): Secondary | ICD-10-CM | POA: Diagnosis not present

## 2022-12-26 DIAGNOSIS — R269 Unspecified abnormalities of gait and mobility: Secondary | ICD-10-CM | POA: Diagnosis not present

## 2022-12-27 ENCOUNTER — Encounter (HOSPITAL_BASED_OUTPATIENT_CLINIC_OR_DEPARTMENT_OTHER): Payer: PPO | Admitting: General Surgery

## 2022-12-27 ENCOUNTER — Encounter: Payer: PPO | Admitting: Nurse Practitioner

## 2022-12-27 DIAGNOSIS — L89314 Pressure ulcer of right buttock, stage 4: Secondary | ICD-10-CM | POA: Diagnosis not present

## 2022-12-28 ENCOUNTER — Ambulatory Visit (INDEPENDENT_AMBULATORY_CARE_PROVIDER_SITE_OTHER): Payer: PPO

## 2022-12-28 ENCOUNTER — Other Ambulatory Visit: Payer: Self-pay | Admitting: Nurse Practitioner

## 2022-12-28 ENCOUNTER — Telehealth: Payer: Self-pay

## 2022-12-28 VITALS — Ht 62.0 in | Wt 212.0 lb

## 2022-12-28 DIAGNOSIS — Z0000 Encounter for general adult medical examination without abnormal findings: Secondary | ICD-10-CM | POA: Diagnosis not present

## 2022-12-28 DIAGNOSIS — B372 Candidiasis of skin and nail: Secondary | ICD-10-CM

## 2022-12-28 DIAGNOSIS — M79604 Pain in right leg: Secondary | ICD-10-CM

## 2022-12-28 DIAGNOSIS — S7291XD Unspecified fracture of right femur, subsequent encounter for closed fracture with routine healing: Secondary | ICD-10-CM

## 2022-12-28 MED ORDER — OXYCODONE HCL 5 MG PO TABS
5.0000 mg | ORAL_TABLET | Freq: Three times a day (TID) | ORAL | 0 refills | Status: AC | PRN
Start: 2022-12-28 — End: 2023-01-11

## 2022-12-28 MED ORDER — NYSTATIN 100000 UNIT/GM EX POWD
1.0000 | Freq: Three times a day (TID) | CUTANEOUS | 1 refills | Status: DC
Start: 2022-12-28 — End: 2023-12-26

## 2022-12-28 NOTE — Telephone Encounter (Signed)
Pt is being followed by wound care and was told that she has yeast on back of skin near wound on back and also yeast in folds in the front of body and wound care suggested treatment from primary with Nystain powder.  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  Pharmacy: [facility] - [place], [place] - [address]

## 2022-12-28 NOTE — Patient Instructions (Addendum)
[name] , Thank you for taking time to come for your Medicare Wellness Visit. I appreciate your ongoing commitment to your health goals. Please review the following plan we discussed and let me know if I can assist you in the future.   These are the goals we discussed:  Goals       Patient advised to contact PCP for AWV and vaccines      Patient stated (pt-stated)      I want to be back to where I was before I fell.        This is a list of the screening recommended for you and due dates:  Health Maintenance  Topic Date Due   COVID-19 Vaccine (7 - 2023-24 season) 01/13/2023*   Pneumonia Vaccine (3 of 3 - PPSV23 or PCV20) 12/28/2023*   Colon Cancer Screening  12/28/2023*   Flu Shot  03/15/2023   Mammogram  12/21/2023   Medicare Annual Wellness Visit  12/28/2023   DTaP/Tdap/Td vaccine (3 - Td or Tdap) 03/21/2028   DEXA scan (bone density measurement)  Completed   Hepatitis C Screening: USPSTF Recommendation to screen - Ages 65-79 yo.  Completed   Zoster (Shingles) Vaccine  Completed   HPV Vaccine  Aged Out  *Topic was postponed. The date shown is not the original due date.    Advanced directives: Please bring a copy of your health care power of attorney and living will to the office to be added to your chart at your convenience.   Conditions/risks identified: None  Next appointment: Follow up in one year for your annual wellness visit    Preventive Care 65 Years and Older, Female Preventive care refers to lifestyle choices and visits with your health care provider that can promote health and wellness. What does preventive care include? A yearly physical exam. This is also called an annual well check. Dental exams once or twice a year. Routine eye exams. Ask your health care provider how often you should have your eyes checked. Personal lifestyle choices, including: Daily care of your teeth and gums. Regular physical activity. Eating a healthy diet. Avoiding tobacco and  drug use. Limiting alcohol use. Practicing safe sex. Taking low-dose aspirin every day. Taking vitamin and mineral supplements as recommended by your health care provider. What happens during an annual well check? The services and screenings done by your health care provider during your annual well check will depend on your age, overall health, lifestyle risk factors, and family history of disease. Counseling  Your health care provider may ask you questions about your: Alcohol use. Tobacco use. Drug use. Emotional well-being. Home and relationship well-being. Sexual activity. Eating habits. History of falls. Memory and ability to understand (cognition). Work and work Astronomer. Reproductive health. Screening  You may have the following tests or measurements: Height, weight, and BMI. Blood pressure. Lipid and cholesterol levels. These may be checked every 5 years, or more frequently if you are over 36 years old. Skin check. Lung cancer screening. You may have this screening every year starting at age 3 if you have a 30-pack-year history of smoking and currently smoke or have quit within the past 15 years. Fecal occult blood test (FOBT) of the stool. You may have this test every year starting at age 74. Flexible sigmoidoscopy or colonoscopy. You may have a sigmoidoscopy every 5 years or a colonoscopy every 10 years starting at age 53. Hepatitis C blood test. Hepatitis B blood test. Sexually transmitted disease (STD) testing. Diabetes  screening. This is done by checking your blood sugar (glucose) after you have not eaten for a while (fasting). You may have this done every 1-3 years. Bone density scan. This is done to screen for osteoporosis. You may have this done starting at age 19. Mammogram. This may be done every 1-2 years. Talk to your health care provider about how often you should have regular mammograms. Talk with your health care provider about your test results, treatment  options, and if necessary, the need for more tests. Vaccines  Your health care provider may recommend certain vaccines, such as: Influenza vaccine. This is recommended every year. Tetanus, diphtheria, and acellular pertussis (Tdap, Td) vaccine. You may need a Td booster every 10 years. Zoster vaccine. You may need this after age 40. Pneumococcal 13-valent conjugate (PCV13) vaccine. One dose is recommended after age 55. Pneumococcal polysaccharide (PPSV23) vaccine. One dose is recommended after age 66. Talk to your health care provider about which screenings and vaccines you need and how often you need them. This information is not intended to replace advice given to you by your health care provider. Make sure you discuss any questions you have with your health care provider. Document Released: 08/27/2015 Document Revised: 04/19/2016 Document Reviewed: 06/01/2015 Elsevier Interactive Patient Education  2017 [employer] Prevention in the Home Falls can cause injuries. They can happen to people of all ages. There are many things you can do to make your home safe and to help prevent falls. What can I do on the outside of my home? Regularly fix the edges of walkways and driveways and fix any cracks. Remove anything that might make you trip as you walk through a door, such as a raised step or threshold. Trim any bushes or trees on the path to your home. Use bright outdoor lighting. Clear any walking paths of anything that might make someone trip, such as rocks or tools. Regularly check to see if handrails are loose or broken. Make sure that both sides of any steps have handrails. Any raised decks and porches should have guardrails on the edges. Have any leaves, snow, or ice cleared regularly. Use sand or salt on walking paths during winter. Clean up any spills in your garage right away. This includes oil or grease spills. What can I do in the bathroom? Use night lights. Install grab  bars by the toilet and in the tub and shower. Do not use towel bars as grab bars. Use non-skid mats or decals in the tub or shower. If you need to sit down in the shower, use a plastic, non-slip stool. Keep the floor dry. Clean up any water that spills on the floor as soon as it happens. Remove soap buildup in the tub or shower regularly. Attach bath mats securely with double-sided non-slip rug tape. Do not have throw rugs and other things on the floor that can make you trip. What can I do in the bedroom? Use night lights. Make sure that you have a light by your bed that is easy to reach. Do not use any sheets or blankets that are too big for your bed. They should not hang down onto the floor. Have a firm chair that has side arms. You can use this for support while you get dressed. Do not have throw rugs and other things on the floor that can make you trip. What can I do in the kitchen? Clean up any spills right away. Avoid walking on wet floors. Keep  items that you use a lot in easy-to-reach places. If you need to reach something above you, use a strong step stool that has a grab bar. Keep electrical cords out of the way. Do not use floor polish or wax that makes floors slippery. If you must use wax, use non-skid floor wax. Do not have throw rugs and other things on the floor that can make you trip. What can I do with my stairs? Do not leave any items on the stairs. Make sure that there are handrails on both sides of the stairs and use them. Fix handrails that are broken or loose. Make sure that handrails are as long as the stairways. Check any carpeting to make sure that it is firmly attached to the stairs. Fix any carpet that is loose or worn. Avoid having throw rugs at the top or bottom of the stairs. If you do have throw rugs, attach them to the floor with carpet tape. Make sure that you have a light switch at the top of the stairs and the bottom of the stairs. If you do not have them,  ask someone to add them for you. What else can I do to help prevent falls? Wear shoes that: Do not have high heels. Have rubber bottoms. Are comfortable and fit you well. Are closed at the toe. Do not wear sandals. If you use a stepladder: Make sure that it is fully opened. Do not climb a closed stepladder. Make sure that both sides of the stepladder are locked into place. Ask someone to hold it for you, if possible. Clearly mark and make sure that you can see: Any grab bars or handrails. First and last steps. Where the edge of each step is. Use tools that help you move around (mobility aids) if they are needed. These include: Canes. Walkers. Scooters. Crutches. Turn on the lights when you go into a dark area. Replace any light bulbs as soon as they burn out. Set up your furniture so you have a clear path. Avoid moving your furniture around. If any of your floors are uneven, fix them. If there are any pets around you, be aware of where they are. Review your medicines with your doctor. Some medicines can make you feel dizzy. This can increase your chance of falling. Ask your doctor what other things that you can do to help prevent falls. This information is not intended to replace advice given to you by your health care provider. Make sure you discuss any questions you have with your health care provider. Document Released: 05/27/2009 Document Revised: 01/06/2016 Document Reviewed: 09/04/2014 Elsevier Interactive Patient Education  2017 [employer].

## 2022-12-28 NOTE — Progress Notes (Signed)
Subjective:   [name] is a 72 y.o. female who presents for Medicare Annual (Subsequent) preventive examination.  Review of Systems    Virtual Visit via Telephone Note  I connected with  [name] on 12/28/22 at 12:30 PM EDT by telephone and verified that I am speaking with the correct person using two identifiers.  Location: Patient: Home Provider: Office Persons participating in the virtual visit: patient/Nurse Health Advisor   I discussed the limitations, risks, security and privacy concerns of performing an evaluation and management service by telephone and the availability of in person appointments. The patient expressed understanding and agreed to proceed.  Interactive audio and video telecommunications were attempted between this nurse and patient, however failed, due to patient having technical difficulties OR patient did not have access to video capability.  We continued and completed visit with audio only.  Some vital signs may be absent or patient reported.   Tillie Rung, LPN  Cardiac Risk Factors include: advanced age (>53men, >53 women);Other (see comment), Risk factor comments: Dx: CHF     Objective:    Today's Vitals   12/28/22 1233  Weight: 212 lb (96.2 kg)  Height: 5\' 2"  (1.575 m)   Body mass index is 38.78 kg/m.     12/28/2022   12:51 PM 06/22/2022   10:03 AM 12/13/2021    8:18 PM 07/25/2019    6:18 AM 09/26/2017    1:22 PM 03/20/2017    1:37 PM 02/03/2017    3:38 PM  Advanced Directives  Does Patient Have a Medical Advance Directive? Yes No;Yes Yes Yes Yes Yes No  Type of Estate agent of [place];Living will Healthcare Power of [place];Out of facility DNR (pink MOST or yellow form) Healthcare Power of [employer] of [place];Living will Healthcare Power of [place];Living will Healthcare Power of [place];Living will   Does patient want to make changes to medical advance directive?   No - Patient declined  No - Patient declined  No - Patient declined   Copy of Healthcare Power of Attorney in Chart? No - copy requested  Yes - validated most recent copy scanned in chart (See row information) Yes - validated most recent copy scanned in chart (See row information)       Current Medications (verified) Outpatient Encounter Medications as of 12/28/2022  Medication Sig   amiodarone (PACERONE) 200 MG tablet TAKE 1 TABLET (200 MG TOTAL) BY MOUTH DAILY.   aspirin EC 81 MG tablet Take 81 mg by mouth at bedtime.   cetirizine (ZYRTEC) 10 MG tablet Take 1 tablet (10 mg total) by mouth daily. (Patient taking differently: Take 10 mg by mouth at bedtime.)   colestipol (COLESTID) 1 g tablet Take 2 g by mouth at bedtime.   cycloSPORINE (RESTASIS) 0.05 % ophthalmic emulsion Place 2 drops into both eyes 2 (two) times daily as needed (irritation).   diphenoxylate-atropine (LOMOTIL) 2.5-0.025 MG per tablet Take 1 tablet by mouth 4 (four) times daily as needed for diarrhea or loose stools.   doxylamine, Sleep, (UNISOM) 25 MG tablet Take 1 tablet (25 mg total) by mouth at bedtime as needed. (Patient taking differently: Take 25 mg by mouth at bedtime.)   fenofibrate (TRICOR) 48 MG tablet Take 1 tablet (48 mg total) by mouth daily.   fluticasone (FLONASE) 50 MCG/ACT nasal spray Place 1 spray into both nostrils daily. (Patient taking differently: Place 1 spray into both nostrils as needed for allergies.)   gabapentin (NEURONTIN) 300 MG  capsule TAKE 2 CAPSULES BY MOUTH 2 TIMES DAILY. (Patient taking differently: Take 600 mg by mouth 2 (two) times daily.)   hydroxychloroquine (PLAQUENIL) 200 MG tablet Take 1 tablet (200 mg total) by mouth daily. (Patient taking differently: Take 200 mg by mouth every Monday, Tuesday, Wednesday, Thursday, and Friday.)   hyoscyamine (ANASPAZ) 0.125 MG TBDP disintergrating tablet Place 0.25 mg under the tongue every 6 (six) hours as needed for cramping.    ibuprofen (IBU) 800 MG tablet TAKE 1 TABLET  BY MOUTH EVERY 6 HOURS AS NEEDED FOR MILD PAIN.   leflunomide (ARAVA) 20 MG tablet TAKE 1 TABLET (20 MG TOTAL) BY MOUTH DAILY.   lidocaine (LIDODERM) 5 % PLACE 1 PATCH ONTO THE SKIN DAILY AS NEEDED FOR PAIN. REMOVE AND DISCARD PATCH WITHIN 12 HOURS OR AS DIRECTED BY MD. (Patient taking differently: Place 1 patch onto the skin 3 (three) times daily as needed (pain).)   metoprolol succinate (TOPROL-XL) 25 MG 24 hr tablet TAKE 1/2 TABLET BY MOUTH (12.5 MG TOTAL) DAILY   Multiple Vitamin (MULTIVITAMIN WITH MINERALS) TABS tablet Take 1 tablet by mouth daily.   NONFORMULARY OR COMPOUNDED ITEM Triamcinolone 0.1% & Silvadene cream 1:1- Apply as directed to affected areas as needed   omeprazole (PRILOSEC) 40 MG capsule Take 40 mg by mouth as needed (nausea).   potassium chloride (KLOR-CON) 10 MEQ tablet Take 2 tablets (20 mEq total) by mouth daily.   promethazine (PHENERGAN) 25 MG tablet TAKE 1 TABLET BY MOUTH EVERY 6 HOURS AS NEEDED.   rosuvastatin (CRESTOR) 10 MG tablet TAKE 1 TABLET BY MOUTH DAILY.   spironolactone (ALDACTONE) 25 MG tablet Take 12.5 mg by mouth daily.   torsemide 40 MG TABS Take 40 mg by mouth daily.   UNABLE TO FIND CPAP: AT bedtime; setting is "12"   [DISCONTINUED] LORazepam (ATIVAN) 0.5 MG tablet Take 1 tablet po QD prn 1 hour prior to dressing changes   [DISCONTINUED] oxyCODONE (ROXICODONE) 5 MG immediate release tablet Take 1 tablet (5 mg total) by mouth every 6 (six) hours as needed for severe pain.   [DISCONTINUED] Probiotic Product (PROBIOTIC DAILY PO) Take by mouth. Ultraflora IB Probiotic   No facility-administered encounter medications on file as of 12/28/2022.    Allergies (verified) Sulfa antibiotics, Cymbalta [duloxetine hcl], Demerol, Ivp dye [iodinated contrast media], Morphine and codeine, Sulfasalazine, and Adhesive [tape]   History: Past Medical History:  Diagnosis Date   Agatston coronary artery calcium score greater than 400 05/2022   coronary Ca score 856    Back pain    Blood transfusion    1981   Chronic diastolic CHF (congestive heart failure) (HCC)    diastolic    COVID    DDD (degenerative disc disease), cervical    DDD (degenerative disc disease), lumbar    Fibromyalgia    GERD (gastroesophageal reflux disease)    Heart murmur    as a child   Hiatal hernia    sjorgens syndrome   High blood pressure    High cholesterol    IBS (irritable bowel syndrome)    OSA (obstructive sleep apnea)    Osteoarthritis    Peripheral autonomic neuropathy of unknown cause    Pre-diabetes    PVC (premature ventricular contraction)    Raynaud disease    Rheumatoid arthritis (HCC)    Sjogren's disease (HCC)    Urticaria    Vitamin D deficiency    Past Surgical History:  Procedure Laterality Date   ABDOMINAL HYSTERECTOMY  BTL, BSO   ADENOIDECTOMY     BREAST EXCISIONAL BIOPSY Left    BREAST SURGERY     mass removal    CHOLECYSTECTOMY     dental implants     DIAGNOSTIC LAPAROSCOPY     x3   FEMUR FRACTURE SURGERY Right    KNEE ARTHROSCOPY     x2   MASS EXCISION Left 03/22/2017   Procedure: EXCISION OF LEFT BREAST MASS;  Surgeon: Manus Rudd, MD;  Location: [facility];  Service: General;  Laterality: Left;   ORIF FEMUR FRACTURE Right 06/22/2022   Procedure: RIGHT OPEN REDUCTION INTERNAL FIXATION (ORIF) DISTAL FEMUR FRACTURE;  Surgeon: Myrene Galas, MD;  Location: [facility];  Service: Orthopedics;  Laterality: Right;   patotid cystectomy     RIGHT/LEFT HEART CATH AND CORONARY ANGIOGRAPHY N/A 07/25/2019   Procedure: RIGHT/LEFT HEART CATH AND CORONARY ANGIOGRAPHY;  Surgeon: Dolores Patty, MD;  Location: [facility];  Service: Cardiovascular;  Laterality: N/A;   TENDON REPAIR  1980   left ankle and tibia   TONSILLECTOMY     TOTAL KNEE ARTHROPLASTY  06/21/2011   Procedure: TOTAL KNEE ARTHROPLASTY;  Surgeon: Loanne Drilling;  Location: [facility];  Service: Orthopedics;  Laterality: Right;   TUBAL LIGATION     Family History   Problem Relation Age of Onset   Alzheimer's disease Mother    Heart attack Mother    Hypertension Mother    Glaucoma Mother    High Cholesterol Mother    Heart disease Mother    Depression Mother    Cancer Father    High Cholesterol Father    Heart disease Father    Sleep apnea Father    Breast cancer Maternal Aunt    Heart attack Brother    Heart disease Brother    Glaucoma Brother    Hyperlipidemia Brother    Glaucoma Brother    Asthma Son    Allergic rhinitis Neg Hx    Angioedema Neg Hx    Eczema Neg Hx    Urticaria Neg Hx    Neuropathy Neg Hx    Social History   Socioeconomic History   Marital status: Widowed    Spouse name: Not on file   Number of children: 2   Years of education: AS   Highest education level: Not on file  Occupational History   Occupation: retired Engineer, civil (consulting)   Occupation: RN  Tobacco Use   Smoking status: Never    Passive exposure: Past   Smokeless tobacco: Never  Vaping Use   Vaping Use: Never used  Substance and Sexual Activity   Alcohol use: Yes    Comment: rarely   Drug use: No   Sexual activity: Never  Other Topics Concern   Not on file  Social History Narrative   Lives at home alone   Right-handed   Drinks 1 or less cups of coffee and 2 servings of either tea or soda per day   Social Determinants of Health   Financial Resource Strain: Low Risk  (12/28/2022)   Overall Financial Resource Strain (CARDIA)    Difficulty of Paying Living Expenses: Not hard at all  Food Insecurity: No Food Insecurity (12/28/2022)   Hunger Vital Sign    Worried About Running Out of Food in the Last Year: Never true    Ran Out of Food in the Last Year: Never true  Transportation Needs: No Transportation Needs (12/28/2022)   PRAPARE - Transportation    Lack of Transportation (  Medical): No    Lack of Transportation (Non-Medical): No  Physical Activity: Unknown (12/28/2022)   Exercise Vital Sign    Days of Exercise per Week: Not on file    Minutes of  Exercise per Session: 10 min  Stress: No Stress Concern Present (12/28/2022)   [employer] of Occupational Health - Occupational Stress Questionnaire    Feeling of Stress : Not at all  Social Connections: Moderately Integrated (12/28/2022)   Social Connection and Isolation Panel [NHANES]    Frequency of Communication with Friends and Family: More than three times a week    Frequency of Social Gatherings with Friends and Family: More than three times a week    Attends Religious Services: More than 4 times per year    Active Member of [employer] or Organizations: Yes    Attends Banker Meetings: More than 4 times per year    Marital Status: Widowed    Tobacco Counseling Counseling given: Not Answered   Clinical Intake:  Pre-visit preparation completed: No  Pain : No/denies pain     BMI - recorded: 38.78 Nutritional Status: BMI > 30  Obese Nutritional Risks: None Diabetes: No  How often do you need to have someone help you when you read instructions, pamphlets, or other written materials from your doctor or pharmacy?: 1 - Never  Diabetic?  No  Interpreter Needed?: No  Information entered by :: Theresa Mulligan LPN   Activities of Daily Living    12/28/2022   12:45 PM 12/24/2022    4:08 PM  In your present state of health, do you have any difficulty performing the following activities:  Hearing? 0 0  Vision? 0 0  Difficulty concentrating or making decisions? 0 0  Walking or climbing stairs? 1 1  Comment Uses Walker, wheelchair   Dressing or bathing? 1 1  Comment Aide assist   Doing errands, shopping? 1 1  Comment Aide Advice worker and eating ? N N  Using the Toilet? N N  In the past six months, have you accidently leaked urine? Malvin Johns  Comment Wears depends. Followed by [facility]   Do you have problems with loss of bowel control? N N  Managing your Medications? N N  Managing your Finances? N N  Housekeeping or managing your  Housekeeping? N Y    Patient Care Team: Carlean Jews, NP as PCP - General (Family Medicine) Quintella Reichert, MD as PCP - Cardiology (Cardiology) Pollyann Savoy, MD as Consulting Physician (Rheumatology) Quintella Reichert, MD as Consulting Physician (Cardiology) Ollen Gross, MD as Consulting Physician (Orthopedic Surgery) York Spaniel, MD (Inactive) as Consulting Physician (Neurology) Charna Elizabeth, MD as Consulting Physician (Gastroenterology) [place], Amy, MD as Consulting Physician (Dermatology) Annie Sable, MD as Consulting Physician (Nephrology) Bobbitt, Heywood Iles, MD as Consulting Physician (Allergy and Immunology)  Indicate any recent Medical Services you may have received from other than [facility] providers in the past year (date may be approximate).     Assessment:   This is a routine wellness examination for [employer].  Hearing/Vision screen Hearing Screening - Comments:: Denies hearing difficulties   Vision Screening - Comments:: Wears rx glasses - up to date with routine eye exams with  Dr Randon Goldsmith  Dietary issues and exercise activities discussed: Exercise limited by: orthopedic condition(s)   Goals Addressed               This Visit's Progress     Patient stated (pt-stated)  I want to be back to where I was before I fell.       Depression Screen    12/28/2022   12:43 PM 09/18/2022    2:28 PM 08/15/2022   10:08 AM 03/27/2022    1:47 PM 05/10/2021    2:20 PM 02/04/2021   11:31 AM 07/28/2020    1:16 PM  PHQ 2/9 Scores  PHQ - 2 Score 0 2 0 0 0 0 0  PHQ- 9 Score  5 6 4 4 5 6     Fall Risk    12/28/2022   12:48 PM 12/24/2022    4:08 PM 09/18/2022    2:28 PM 08/15/2022   10:09 AM 03/27/2022    1:47 PM  Fall Risk   Falls in the past year? 1 1 1 1 1   Number falls in past yr: 1 1 1 1 1   Injury with Fall? 0 1 1 1 1   Risk for fall due to : No Fall Risks    History of fall(s);Impaired balance/gait;Impaired mobility  Follow up Falls prevention  discussed    Falls evaluation completed    FALL RISK PREVENTION PERTAINING TO THE HOME:  Any stairs in or around the home? Yes  If so, are there any without handrails? No  Home free of loose throw rugs in walkways, pet beds, electrical cords, etc? Yes  Adequate lighting in your home to reduce risk of falls? Yes   ASSISTIVE DEVICES UTILIZED TO PREVENT FALLS:  Life alert? Yes  Use of a cane, walker or w/c? Yes  Grab bars in the bathroom? Yes  Shower chair or bench in shower? Yes  Elevated toilet seat or a handicapped toilet? Yes   TIMED UP AND GO:  Was the test performed? No . Audio Visit   Cognitive Function:        12/28/2022   12:51 PM 09/27/2021    3:41 PM 04/29/2019   11:23 AM  6CIT Screen  What Year? 0 points 0 points 0 points  What month? 0 points 0 points 0 points  What time? 0 points 0 points 0 points  Count back from 20 0 points 0 points 0 points  Months in reverse 0 points 0 points 0 points  Repeat phrase 0 points 0 points 0 points  Total Score 0 points 0 points 0 points    Immunizations Immunization History  Administered Date(s) Administered   Fluad Quad(high Dose 65+) 05/05/2021   Influenza, High Dose Seasonal PF 06/12/2018, 05/17/2019   Influenza,inj,Quad PF,6+ Mos 05/23/2016, 06/01/2017   Influenza-Unspecified 06/05/2018, 05/17/2019, 06/07/2020, 05/18/2022   PFIZER(Purple Top)SARS-COV-2 Vaccination 09/03/2019, 09/24/2019, 04/01/2020, 10/04/2020   Pfizer Covid-19 Vaccine Bivalent Booster 6yrs & up 05/05/2021, 05/18/2022   Pneumococcal Conjugate-13 07/02/2017   Pneumococcal Polysaccharide-23 05/17/2005   Respiratory Syncytial Virus Vaccine,Recomb Aduvanted(Arexvy) 05/18/2022   Td 10/16/2007   Tdap 03/21/2018   Zoster Recombinat (Shingrix) 06/10/2019, 12/11/2019    TDAP status: Up to date  Flu Vaccine status: Up to date  Pneumococcal vaccine status: Due, Education has been provided regarding the importance of this vaccine. Advised may receive this  vaccine at local pharmacy or Health Dept. Aware to provide a copy of the vaccination record if obtained from local pharmacy or Health Dept. Verbalized acceptance and understanding.  Covid-19 vaccine status: Completed vaccines  Qualifies for Shingles Vaccine? Yes   Zostavax completed Yes   Shingrix Completed?: Yes  Screening Tests Health Maintenance  Topic Date Due   COVID-19 Vaccine (7 - 2023-24 season) 01/13/2023 (Originally  07/13/2022)   Pneumonia Vaccine 60+ Years old (3 of 3 - PPSV23 or PCV20) 12/28/2023 (Originally 07/02/2022)   COLONOSCOPY (Pts 45-89yrs Insurance coverage will need to be confirmed)  12/28/2023 (Originally 11/21/2022)   INFLUENZA VACCINE  03/15/2023   MAMMOGRAM  12/21/2023   Medicare Annual Wellness (AWV)  12/28/2023   DTaP/Tdap/Td (3 - Td or Tdap) 03/21/2028   DEXA SCAN  Completed   Hepatitis C Screening  Completed   Zoster Vaccines- Shingrix  Completed   HPV VACCINES  Aged Out    Health Maintenance  There are no preventive care reminders to display for this patient.   Colorectal cancer screening: Referral to GI placed Patient declined. Pt aware the office will call re: appt.  Mammogram status: Completed 12/20/21. Repeat every year  Bone Density status: Ordered Scheduled for 02/28/23. Pt provided with contact info and advised to call to schedule appt.  Lung Cancer Screening: (Low Dose CT Chest recommended if Age 60-80 years, 30 pack-year currently smoking OR have quit w/in 15years.) does not qualify.     Additional Screening:  Hepatitis C Screening: does qualify; Completed 05/23/16  Vision Screening: Recommended annual ophthalmology exams for early detection of glaucoma and other disorders of the eye. Is the patient up to date with their annual eye exam?  Yes  Who is the provider or what is the name of the office in which the patient attends annual eye exams? Dr Randon Goldsmith If pt is not established with a provider, would they like to be referred to a provider  to establish care? No .   Dental Screening: Recommended annual dental exams for proper oral hygiene  Community Resource Referral / Chronic Care Management:  CRR required this visit?  No   CCM required this visit?  No      Plan:     I have personally reviewed and noted the following in the patient's chart:   Medical and social history Use of alcohol, tobacco or illicit drugs  Current medications and supplements including opioid prescriptions. Patient is not currently taking opioid prescriptions. Functional ability and status Nutritional status Physical activity Advanced directives List of other physicians Hospitalizations, surgeries, and ER visits in previous 12 months Vitals Screenings to include cognitive, depression, and falls Referrals and appointments  In addition, I have reviewed and discussed with patient certain preventive protocols, quality metrics, and best practice recommendations. A written personalized care plan for preventive services as well as general preventive health recommendations were provided to patient.     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

## 2022-12-28 NOTE — Telephone Encounter (Signed)
Please let the patient know that I sent in nystop powder. This should be applied to the effected areas three times daily. I also renewed the prescription for oxycodone 5mg  which can be taken as needed for dressing changes and/or severe pain. Both prescriptions were sent to [facility].  Thanks so much.   -HB

## 2022-12-29 DIAGNOSIS — I130 Hypertensive heart and chronic kidney disease with heart failure and stage 1 through stage 4 chronic kidney disease, or unspecified chronic kidney disease: Secondary | ICD-10-CM | POA: Diagnosis not present

## 2022-12-29 DIAGNOSIS — G8929 Other chronic pain: Secondary | ICD-10-CM | POA: Diagnosis not present

## 2022-12-29 DIAGNOSIS — Z7982 Long term (current) use of aspirin: Secondary | ICD-10-CM | POA: Diagnosis not present

## 2022-12-29 DIAGNOSIS — L89612 Pressure ulcer of right heel, stage 2: Secondary | ICD-10-CM | POA: Diagnosis not present

## 2022-12-29 DIAGNOSIS — J329 Chronic sinusitis, unspecified: Secondary | ICD-10-CM | POA: Diagnosis not present

## 2022-12-29 DIAGNOSIS — I5032 Chronic diastolic (congestive) heart failure: Secondary | ICD-10-CM | POA: Diagnosis not present

## 2022-12-29 DIAGNOSIS — M3500 Sicca syndrome, unspecified: Secondary | ICD-10-CM | POA: Diagnosis not present

## 2022-12-29 DIAGNOSIS — K219 Gastro-esophageal reflux disease without esophagitis: Secondary | ICD-10-CM | POA: Diagnosis not present

## 2022-12-29 DIAGNOSIS — Z9181 History of falling: Secondary | ICD-10-CM | POA: Diagnosis not present

## 2022-12-29 DIAGNOSIS — Z7985 Long-term (current) use of injectable non-insulin antidiabetic drugs: Secondary | ICD-10-CM | POA: Diagnosis not present

## 2022-12-29 DIAGNOSIS — M797 Fibromyalgia: Secondary | ICD-10-CM | POA: Diagnosis not present

## 2022-12-29 DIAGNOSIS — M545 Low back pain, unspecified: Secondary | ICD-10-CM | POA: Diagnosis not present

## 2022-12-29 DIAGNOSIS — Z96641 Presence of right artificial hip joint: Secondary | ICD-10-CM | POA: Diagnosis not present

## 2022-12-29 DIAGNOSIS — Z87310 Personal history of (healed) osteoporosis fracture: Secondary | ICD-10-CM | POA: Diagnosis not present

## 2022-12-29 DIAGNOSIS — M069 Rheumatoid arthritis, unspecified: Secondary | ICD-10-CM | POA: Diagnosis not present

## 2022-12-29 DIAGNOSIS — N1832 Chronic kidney disease, stage 3b: Secondary | ICD-10-CM | POA: Diagnosis not present

## 2022-12-29 DIAGNOSIS — M810 Age-related osteoporosis without current pathological fracture: Secondary | ICD-10-CM | POA: Diagnosis not present

## 2022-12-29 DIAGNOSIS — G4733 Obstructive sleep apnea (adult) (pediatric): Secondary | ICD-10-CM | POA: Diagnosis not present

## 2022-12-29 DIAGNOSIS — L89314 Pressure ulcer of right buttock, stage 4: Secondary | ICD-10-CM | POA: Diagnosis not present

## 2022-12-29 DIAGNOSIS — E1122 Type 2 diabetes mellitus with diabetic chronic kidney disease: Secondary | ICD-10-CM | POA: Diagnosis not present

## 2023-01-01 DIAGNOSIS — Z7985 Long-term (current) use of injectable non-insulin antidiabetic drugs: Secondary | ICD-10-CM | POA: Diagnosis not present

## 2023-01-01 DIAGNOSIS — L89314 Pressure ulcer of right buttock, stage 4: Secondary | ICD-10-CM | POA: Diagnosis not present

## 2023-01-01 DIAGNOSIS — L89612 Pressure ulcer of right heel, stage 2: Secondary | ICD-10-CM | POA: Diagnosis not present

## 2023-01-01 DIAGNOSIS — I5032 Chronic diastolic (congestive) heart failure: Secondary | ICD-10-CM | POA: Diagnosis not present

## 2023-01-01 DIAGNOSIS — Z85828 Personal history of other malignant neoplasm of skin: Secondary | ICD-10-CM | POA: Diagnosis not present

## 2023-01-01 DIAGNOSIS — R7302 Impaired glucose tolerance (oral): Secondary | ICD-10-CM | POA: Diagnosis not present

## 2023-01-01 DIAGNOSIS — M810 Age-related osteoporosis without current pathological fracture: Secondary | ICD-10-CM | POA: Diagnosis not present

## 2023-01-01 DIAGNOSIS — D225 Melanocytic nevi of trunk: Secondary | ICD-10-CM | POA: Diagnosis not present

## 2023-01-01 DIAGNOSIS — Z87310 Personal history of (healed) osteoporosis fracture: Secondary | ICD-10-CM | POA: Diagnosis not present

## 2023-01-01 DIAGNOSIS — I130 Hypertensive heart and chronic kidney disease with heart failure and stage 1 through stage 4 chronic kidney disease, or unspecified chronic kidney disease: Secondary | ICD-10-CM | POA: Diagnosis not present

## 2023-01-01 DIAGNOSIS — J329 Chronic sinusitis, unspecified: Secondary | ICD-10-CM | POA: Diagnosis not present

## 2023-01-01 DIAGNOSIS — M069 Rheumatoid arthritis, unspecified: Secondary | ICD-10-CM | POA: Diagnosis not present

## 2023-01-01 DIAGNOSIS — L821 Other seborrheic keratosis: Secondary | ICD-10-CM | POA: Diagnosis not present

## 2023-01-01 DIAGNOSIS — K219 Gastro-esophageal reflux disease without esophagitis: Secondary | ICD-10-CM | POA: Diagnosis not present

## 2023-01-01 DIAGNOSIS — D1801 Hemangioma of skin and subcutaneous tissue: Secondary | ICD-10-CM | POA: Diagnosis not present

## 2023-01-01 DIAGNOSIS — Z9181 History of falling: Secondary | ICD-10-CM | POA: Diagnosis not present

## 2023-01-01 DIAGNOSIS — Z7982 Long term (current) use of aspirin: Secondary | ICD-10-CM | POA: Diagnosis not present

## 2023-01-01 DIAGNOSIS — G8929 Other chronic pain: Secondary | ICD-10-CM | POA: Diagnosis not present

## 2023-01-01 DIAGNOSIS — M797 Fibromyalgia: Secondary | ICD-10-CM | POA: Diagnosis not present

## 2023-01-01 DIAGNOSIS — E1122 Type 2 diabetes mellitus with diabetic chronic kidney disease: Secondary | ICD-10-CM | POA: Diagnosis not present

## 2023-01-01 DIAGNOSIS — N1832 Chronic kidney disease, stage 3b: Secondary | ICD-10-CM | POA: Diagnosis not present

## 2023-01-01 DIAGNOSIS — M545 Low back pain, unspecified: Secondary | ICD-10-CM | POA: Diagnosis not present

## 2023-01-01 DIAGNOSIS — G4733 Obstructive sleep apnea (adult) (pediatric): Secondary | ICD-10-CM | POA: Diagnosis not present

## 2023-01-01 DIAGNOSIS — M3500 Sicca syndrome, unspecified: Secondary | ICD-10-CM | POA: Diagnosis not present

## 2023-01-01 DIAGNOSIS — L89313 Pressure ulcer of right buttock, stage 3: Secondary | ICD-10-CM | POA: Diagnosis not present

## 2023-01-01 DIAGNOSIS — L0293 Carbuncle, unspecified: Secondary | ICD-10-CM | POA: Diagnosis not present

## 2023-01-01 DIAGNOSIS — L304 Erythema intertrigo: Secondary | ICD-10-CM | POA: Diagnosis not present

## 2023-01-01 DIAGNOSIS — Z96641 Presence of right artificial hip joint: Secondary | ICD-10-CM | POA: Diagnosis not present

## 2023-01-03 DIAGNOSIS — I130 Hypertensive heart and chronic kidney disease with heart failure and stage 1 through stage 4 chronic kidney disease, or unspecified chronic kidney disease: Secondary | ICD-10-CM | POA: Diagnosis not present

## 2023-01-03 DIAGNOSIS — M069 Rheumatoid arthritis, unspecified: Secondary | ICD-10-CM | POA: Diagnosis not present

## 2023-01-03 DIAGNOSIS — G8929 Other chronic pain: Secondary | ICD-10-CM | POA: Diagnosis not present

## 2023-01-03 DIAGNOSIS — Z7982 Long term (current) use of aspirin: Secondary | ICD-10-CM | POA: Diagnosis not present

## 2023-01-03 DIAGNOSIS — M3500 Sicca syndrome, unspecified: Secondary | ICD-10-CM | POA: Diagnosis not present

## 2023-01-03 DIAGNOSIS — M810 Age-related osteoporosis without current pathological fracture: Secondary | ICD-10-CM | POA: Diagnosis not present

## 2023-01-03 DIAGNOSIS — E1122 Type 2 diabetes mellitus with diabetic chronic kidney disease: Secondary | ICD-10-CM | POA: Diagnosis not present

## 2023-01-03 DIAGNOSIS — K219 Gastro-esophageal reflux disease without esophagitis: Secondary | ICD-10-CM | POA: Diagnosis not present

## 2023-01-03 DIAGNOSIS — I5032 Chronic diastolic (congestive) heart failure: Secondary | ICD-10-CM | POA: Diagnosis not present

## 2023-01-03 DIAGNOSIS — M545 Low back pain, unspecified: Secondary | ICD-10-CM | POA: Diagnosis not present

## 2023-01-03 DIAGNOSIS — N1832 Chronic kidney disease, stage 3b: Secondary | ICD-10-CM | POA: Diagnosis not present

## 2023-01-03 DIAGNOSIS — Z9181 History of falling: Secondary | ICD-10-CM | POA: Diagnosis not present

## 2023-01-03 DIAGNOSIS — Z7985 Long-term (current) use of injectable non-insulin antidiabetic drugs: Secondary | ICD-10-CM | POA: Diagnosis not present

## 2023-01-03 DIAGNOSIS — Z87310 Personal history of (healed) osteoporosis fracture: Secondary | ICD-10-CM | POA: Diagnosis not present

## 2023-01-03 DIAGNOSIS — J329 Chronic sinusitis, unspecified: Secondary | ICD-10-CM | POA: Diagnosis not present

## 2023-01-03 DIAGNOSIS — Z96641 Presence of right artificial hip joint: Secondary | ICD-10-CM | POA: Diagnosis not present

## 2023-01-03 DIAGNOSIS — G4733 Obstructive sleep apnea (adult) (pediatric): Secondary | ICD-10-CM | POA: Diagnosis not present

## 2023-01-03 DIAGNOSIS — L89612 Pressure ulcer of right heel, stage 2: Secondary | ICD-10-CM | POA: Diagnosis not present

## 2023-01-03 DIAGNOSIS — L89314 Pressure ulcer of right buttock, stage 4: Secondary | ICD-10-CM | POA: Diagnosis not present

## 2023-01-03 DIAGNOSIS — M797 Fibromyalgia: Secondary | ICD-10-CM | POA: Diagnosis not present

## 2023-01-04 DIAGNOSIS — M3500 Sicca syndrome, unspecified: Secondary | ICD-10-CM | POA: Diagnosis not present

## 2023-01-04 DIAGNOSIS — Z9181 History of falling: Secondary | ICD-10-CM | POA: Diagnosis not present

## 2023-01-04 DIAGNOSIS — L89314 Pressure ulcer of right buttock, stage 4: Secondary | ICD-10-CM | POA: Diagnosis not present

## 2023-01-04 DIAGNOSIS — M545 Low back pain, unspecified: Secondary | ICD-10-CM | POA: Diagnosis not present

## 2023-01-04 DIAGNOSIS — M069 Rheumatoid arthritis, unspecified: Secondary | ICD-10-CM | POA: Diagnosis not present

## 2023-01-04 DIAGNOSIS — N1832 Chronic kidney disease, stage 3b: Secondary | ICD-10-CM | POA: Diagnosis not present

## 2023-01-04 DIAGNOSIS — G4733 Obstructive sleep apnea (adult) (pediatric): Secondary | ICD-10-CM | POA: Diagnosis not present

## 2023-01-04 DIAGNOSIS — G8929 Other chronic pain: Secondary | ICD-10-CM | POA: Diagnosis not present

## 2023-01-04 DIAGNOSIS — Z96641 Presence of right artificial hip joint: Secondary | ICD-10-CM | POA: Diagnosis not present

## 2023-01-04 DIAGNOSIS — Z7985 Long-term (current) use of injectable non-insulin antidiabetic drugs: Secondary | ICD-10-CM | POA: Diagnosis not present

## 2023-01-04 DIAGNOSIS — M797 Fibromyalgia: Secondary | ICD-10-CM | POA: Diagnosis not present

## 2023-01-04 DIAGNOSIS — L89612 Pressure ulcer of right heel, stage 2: Secondary | ICD-10-CM | POA: Diagnosis not present

## 2023-01-04 DIAGNOSIS — J329 Chronic sinusitis, unspecified: Secondary | ICD-10-CM | POA: Diagnosis not present

## 2023-01-04 DIAGNOSIS — Z7982 Long term (current) use of aspirin: Secondary | ICD-10-CM | POA: Diagnosis not present

## 2023-01-04 DIAGNOSIS — I5032 Chronic diastolic (congestive) heart failure: Secondary | ICD-10-CM | POA: Diagnosis not present

## 2023-01-04 DIAGNOSIS — Z87310 Personal history of (healed) osteoporosis fracture: Secondary | ICD-10-CM | POA: Diagnosis not present

## 2023-01-04 DIAGNOSIS — E1122 Type 2 diabetes mellitus with diabetic chronic kidney disease: Secondary | ICD-10-CM | POA: Diagnosis not present

## 2023-01-04 DIAGNOSIS — M810 Age-related osteoporosis without current pathological fracture: Secondary | ICD-10-CM | POA: Diagnosis not present

## 2023-01-04 DIAGNOSIS — I130 Hypertensive heart and chronic kidney disease with heart failure and stage 1 through stage 4 chronic kidney disease, or unspecified chronic kidney disease: Secondary | ICD-10-CM | POA: Diagnosis not present

## 2023-01-04 DIAGNOSIS — K219 Gastro-esophageal reflux disease without esophagitis: Secondary | ICD-10-CM | POA: Diagnosis not present

## 2023-01-07 ENCOUNTER — Encounter (HOSPITAL_BASED_OUTPATIENT_CLINIC_OR_DEPARTMENT_OTHER): Payer: PPO | Admitting: General Surgery

## 2023-01-10 ENCOUNTER — Encounter (HOSPITAL_BASED_OUTPATIENT_CLINIC_OR_DEPARTMENT_OTHER): Payer: PPO | Admitting: General Surgery

## 2023-01-10 DIAGNOSIS — L89314 Pressure ulcer of right buttock, stage 4: Secondary | ICD-10-CM | POA: Diagnosis not present

## 2023-01-10 DIAGNOSIS — L89614 Pressure ulcer of right heel, stage 4: Secondary | ICD-10-CM | POA: Diagnosis not present

## 2023-01-11 DIAGNOSIS — R2689 Other abnormalities of gait and mobility: Secondary | ICD-10-CM | POA: Diagnosis not present

## 2023-01-11 DIAGNOSIS — S72401D Unspecified fracture of lower end of right femur, subsequent encounter for closed fracture with routine healing: Secondary | ICD-10-CM | POA: Diagnosis not present

## 2023-01-11 DIAGNOSIS — I5022 Chronic systolic (congestive) heart failure: Secondary | ICD-10-CM | POA: Diagnosis not present

## 2023-01-11 DIAGNOSIS — L89614 Pressure ulcer of right heel, stage 4: Secondary | ICD-10-CM | POA: Diagnosis not present

## 2023-01-11 DIAGNOSIS — S72409D Unspecified fracture of lower end of unspecified femur, subsequent encounter for closed fracture with routine healing: Secondary | ICD-10-CM | POA: Diagnosis not present

## 2023-01-11 DIAGNOSIS — M6281 Muscle weakness (generalized): Secondary | ICD-10-CM | POA: Diagnosis not present

## 2023-01-12 DIAGNOSIS — Z87310 Personal history of (healed) osteoporosis fracture: Secondary | ICD-10-CM | POA: Diagnosis not present

## 2023-01-12 DIAGNOSIS — M069 Rheumatoid arthritis, unspecified: Secondary | ICD-10-CM | POA: Diagnosis not present

## 2023-01-12 DIAGNOSIS — Z7985 Long-term (current) use of injectable non-insulin antidiabetic drugs: Secondary | ICD-10-CM | POA: Diagnosis not present

## 2023-01-12 DIAGNOSIS — J329 Chronic sinusitis, unspecified: Secondary | ICD-10-CM | POA: Diagnosis not present

## 2023-01-12 DIAGNOSIS — Z7982 Long term (current) use of aspirin: Secondary | ICD-10-CM | POA: Diagnosis not present

## 2023-01-12 DIAGNOSIS — G4733 Obstructive sleep apnea (adult) (pediatric): Secondary | ICD-10-CM | POA: Diagnosis not present

## 2023-01-12 DIAGNOSIS — L89314 Pressure ulcer of right buttock, stage 4: Secondary | ICD-10-CM | POA: Diagnosis not present

## 2023-01-12 DIAGNOSIS — I5032 Chronic diastolic (congestive) heart failure: Secondary | ICD-10-CM | POA: Diagnosis not present

## 2023-01-12 DIAGNOSIS — K219 Gastro-esophageal reflux disease without esophagitis: Secondary | ICD-10-CM | POA: Diagnosis not present

## 2023-01-12 DIAGNOSIS — Z9181 History of falling: Secondary | ICD-10-CM | POA: Diagnosis not present

## 2023-01-12 DIAGNOSIS — I130 Hypertensive heart and chronic kidney disease with heart failure and stage 1 through stage 4 chronic kidney disease, or unspecified chronic kidney disease: Secondary | ICD-10-CM | POA: Diagnosis not present

## 2023-01-12 DIAGNOSIS — N1832 Chronic kidney disease, stage 3b: Secondary | ICD-10-CM | POA: Diagnosis not present

## 2023-01-12 DIAGNOSIS — R7302 Impaired glucose tolerance (oral): Secondary | ICD-10-CM | POA: Diagnosis not present

## 2023-01-12 DIAGNOSIS — M810 Age-related osteoporosis without current pathological fracture: Secondary | ICD-10-CM | POA: Diagnosis not present

## 2023-01-12 DIAGNOSIS — M3500 Sicca syndrome, unspecified: Secondary | ICD-10-CM | POA: Diagnosis not present

## 2023-01-12 DIAGNOSIS — M797 Fibromyalgia: Secondary | ICD-10-CM | POA: Diagnosis not present

## 2023-01-12 DIAGNOSIS — Z96641 Presence of right artificial hip joint: Secondary | ICD-10-CM | POA: Diagnosis not present

## 2023-01-12 DIAGNOSIS — L89612 Pressure ulcer of right heel, stage 2: Secondary | ICD-10-CM | POA: Diagnosis not present

## 2023-01-12 DIAGNOSIS — M545 Low back pain, unspecified: Secondary | ICD-10-CM | POA: Diagnosis not present

## 2023-01-12 DIAGNOSIS — E1122 Type 2 diabetes mellitus with diabetic chronic kidney disease: Secondary | ICD-10-CM | POA: Diagnosis not present

## 2023-01-12 DIAGNOSIS — L89313 Pressure ulcer of right buttock, stage 3: Secondary | ICD-10-CM | POA: Diagnosis not present

## 2023-01-12 DIAGNOSIS — G8929 Other chronic pain: Secondary | ICD-10-CM | POA: Diagnosis not present

## 2023-01-13 DIAGNOSIS — L89314 Pressure ulcer of right buttock, stage 4: Secondary | ICD-10-CM | POA: Diagnosis not present

## 2023-01-15 DIAGNOSIS — M069 Rheumatoid arthritis, unspecified: Secondary | ICD-10-CM | POA: Diagnosis not present

## 2023-01-15 DIAGNOSIS — G4733 Obstructive sleep apnea (adult) (pediatric): Secondary | ICD-10-CM | POA: Diagnosis not present

## 2023-01-15 DIAGNOSIS — Z7982 Long term (current) use of aspirin: Secondary | ICD-10-CM | POA: Diagnosis not present

## 2023-01-15 DIAGNOSIS — M810 Age-related osteoporosis without current pathological fracture: Secondary | ICD-10-CM | POA: Diagnosis not present

## 2023-01-15 DIAGNOSIS — L89612 Pressure ulcer of right heel, stage 2: Secondary | ICD-10-CM | POA: Diagnosis not present

## 2023-01-15 DIAGNOSIS — Z9181 History of falling: Secondary | ICD-10-CM | POA: Diagnosis not present

## 2023-01-15 DIAGNOSIS — N1832 Chronic kidney disease, stage 3b: Secondary | ICD-10-CM | POA: Diagnosis not present

## 2023-01-15 DIAGNOSIS — G8929 Other chronic pain: Secondary | ICD-10-CM | POA: Diagnosis not present

## 2023-01-15 DIAGNOSIS — I5032 Chronic diastolic (congestive) heart failure: Secondary | ICD-10-CM | POA: Diagnosis not present

## 2023-01-15 DIAGNOSIS — K219 Gastro-esophageal reflux disease without esophagitis: Secondary | ICD-10-CM | POA: Diagnosis not present

## 2023-01-15 DIAGNOSIS — M3500 Sicca syndrome, unspecified: Secondary | ICD-10-CM | POA: Diagnosis not present

## 2023-01-15 DIAGNOSIS — Z7985 Long-term (current) use of injectable non-insulin antidiabetic drugs: Secondary | ICD-10-CM | POA: Diagnosis not present

## 2023-01-15 DIAGNOSIS — E1122 Type 2 diabetes mellitus with diabetic chronic kidney disease: Secondary | ICD-10-CM | POA: Diagnosis not present

## 2023-01-15 DIAGNOSIS — M797 Fibromyalgia: Secondary | ICD-10-CM | POA: Diagnosis not present

## 2023-01-15 DIAGNOSIS — J329 Chronic sinusitis, unspecified: Secondary | ICD-10-CM | POA: Diagnosis not present

## 2023-01-15 DIAGNOSIS — M545 Low back pain, unspecified: Secondary | ICD-10-CM | POA: Diagnosis not present

## 2023-01-15 DIAGNOSIS — L89314 Pressure ulcer of right buttock, stage 4: Secondary | ICD-10-CM | POA: Diagnosis not present

## 2023-01-15 DIAGNOSIS — Z96641 Presence of right artificial hip joint: Secondary | ICD-10-CM | POA: Diagnosis not present

## 2023-01-15 DIAGNOSIS — I130 Hypertensive heart and chronic kidney disease with heart failure and stage 1 through stage 4 chronic kidney disease, or unspecified chronic kidney disease: Secondary | ICD-10-CM | POA: Diagnosis not present

## 2023-01-15 DIAGNOSIS — Z87310 Personal history of (healed) osteoporosis fracture: Secondary | ICD-10-CM | POA: Diagnosis not present

## 2023-01-24 ENCOUNTER — Encounter (HOSPITAL_BASED_OUTPATIENT_CLINIC_OR_DEPARTMENT_OTHER): Payer: PPO | Attending: General Surgery | Admitting: General Surgery

## 2023-01-24 DIAGNOSIS — I7300 Raynaud's syndrome without gangrene: Secondary | ICD-10-CM | POA: Insufficient documentation

## 2023-01-24 DIAGNOSIS — Z7985 Long-term (current) use of injectable non-insulin antidiabetic drugs: Secondary | ICD-10-CM | POA: Diagnosis not present

## 2023-01-24 DIAGNOSIS — M797 Fibromyalgia: Secondary | ICD-10-CM | POA: Diagnosis not present

## 2023-01-24 DIAGNOSIS — E785 Hyperlipidemia, unspecified: Secondary | ICD-10-CM | POA: Diagnosis not present

## 2023-01-24 DIAGNOSIS — Z6837 Body mass index (BMI) 37.0-37.9, adult: Secondary | ICD-10-CM | POA: Diagnosis not present

## 2023-01-24 DIAGNOSIS — I5032 Chronic diastolic (congestive) heart failure: Secondary | ICD-10-CM | POA: Diagnosis not present

## 2023-01-24 DIAGNOSIS — N1832 Chronic kidney disease, stage 3b: Secondary | ICD-10-CM | POA: Diagnosis not present

## 2023-01-24 DIAGNOSIS — I130 Hypertensive heart and chronic kidney disease with heart failure and stage 1 through stage 4 chronic kidney disease, or unspecified chronic kidney disease: Secondary | ICD-10-CM | POA: Insufficient documentation

## 2023-01-24 DIAGNOSIS — E669 Obesity, unspecified: Secondary | ICD-10-CM | POA: Diagnosis not present

## 2023-01-24 DIAGNOSIS — G4730 Sleep apnea, unspecified: Secondary | ICD-10-CM | POA: Insufficient documentation

## 2023-01-24 DIAGNOSIS — L89314 Pressure ulcer of right buttock, stage 4: Secondary | ICD-10-CM | POA: Insufficient documentation

## 2023-01-24 DIAGNOSIS — R7302 Impaired glucose tolerance (oral): Secondary | ICD-10-CM | POA: Insufficient documentation

## 2023-01-24 DIAGNOSIS — K219 Gastro-esophageal reflux disease without esophagitis: Secondary | ICD-10-CM | POA: Diagnosis not present

## 2023-01-24 DIAGNOSIS — L89614 Pressure ulcer of right heel, stage 4: Secondary | ICD-10-CM | POA: Diagnosis not present

## 2023-01-24 DIAGNOSIS — M069 Rheumatoid arthritis, unspecified: Secondary | ICD-10-CM | POA: Diagnosis not present

## 2023-01-25 NOTE — Progress Notes (Signed)
Marisa, Cooper ([id number]) [id number] Page 1 of 9 Visit Report for 01/24/2023 Arrival Information Details Patient Name: Date of Service: [name], [name] 01/24/2023 2:00 PM Medical Record Number: [id number] Patient Account Number: [id number] Date of Birth/Sex: Treating RN: 1950/12/13 (72 y.o. [name] Primary Care Haelee Bolen: Vincent Gros Other Clinician: Referring Kaled Allende: Treating Whitney Bingaman/Extender: Greer Pickerel in Treatment: 19 Visit Information History Since Last Visit Added or deleted any medications: No Patient Arrived: Walker Any new allergies or adverse reactions: No Arrival Time: 14:16 Had a fall or experienced change in No Accompanied By: daughter activities of daily living that may affect Transfer Assistance: None risk of falls: Patient Identification Verified: Yes Signs or symptoms of abuse/neglect since last visito No Secondary Verification Process Completed: Yes Hospitalized since last visit: No Patient Requires Transmission-Based Precautions: No Implantable device outside of the clinic excluding No Patient Has Alerts: No cellular tissue based products placed in the center since last visit: Has Dressing in Place as Prescribed: Yes Pain Present Now: Yes Electronic Signature(s) Signed: 01/24/2023 4:59:51 PM By: Zenaida Deed RN, BSN Entered By: Zenaida Deed on 01/24/2023 14:19:28 -------------------------------------------------------------------------------- Encounter Discharge Information Details Patient Name: Date of Service: [name] 01/24/2023 2:00 PM Medical Record Number: [id number] Patient Account Number: [id number] Date of Birth/Sex: Treating RN: Jul 19, 1951 (72 y.o. [name] Primary Care Ozan Maclay: Vincent Gros Other Clinician: Referring Thoma Paulsen: Treating Otniel Hoe/Extender: Greer Pickerel in Treatment: 32 Encounter Discharge Information  Items Post Procedure Vitals Discharge Condition: Stable Temperature (F): 97.8 Ambulatory Status: Walker Pulse (bpm): 75 Discharge Destination: Home Respiratory Rate (breaths/min): 18 Transportation: Private Auto Blood Pressure (mmHg): 113/53 Accompanied By: daughter Schedule Follow-up Appointment: Yes Clinical Summary of Care: Patient Declined Electronic Signature(s) Signed: 01/24/2023 4:59:51 PM By: Zenaida Deed RN, BSN Entered By: Zenaida Deed on 01/24/2023 16:58:51 -------------------------------------------------------------------------------- Lower Extremity Assessment Details Patient Name: Date of Service: [name] 01/24/2023 2:00 PM Medical Record Number: [id number] Patient Account Number: [id number] Date of Birth/Sex: Treating RN: 1951-03-13 (72 y.o. [name] Primary Care [name]: Vincent Gros Other Clinician: Referring [name]: Treating [name]/Extender: Greer Pickerel in Treatment: 19 Edema Assessment Assessed: [name]: No] [name]: No] C[LeftERYKAH, LIPPERT E ([id number])] [Right: [id number] Page 2 of 9] Edema: [Left: Ye] [Right: s] Calf Left: Right: Point of Measurement: From Medial Instep 37.5 cm Ankle Left: Right: Point of Measurement: From Medial Instep 26.5 cm Vascular Assessment Pulses: Dorsalis Pedis Palpable: [Right:Yes] Electronic Signature(s) Signed: 01/24/2023 4:59:51 PM By: Zenaida Deed RN, BSN Entered By: Zenaida Deed on 01/24/2023 14:30:29 -------------------------------------------------------------------------------- Multi Wound Chart Details Patient Name: Date of Service: [name] 01/24/2023 2:00 PM Medical Record Number: [id number] Patient Account Number: [id number] Date of Birth/Sex: Treating RN: 12-Nov-1950 (72 y.o. F) Primary Care Deeya Richeson: Vincent Gros Other Clinician: Referring Bettina Warn: Treating Shevawn Langenberg/Extender: Greer Pickerel in Treatment: 19 Vital Signs Height(in): 62 Pulse(bpm): 75 Weight(lbs): 207 Blood Pressure(mmHg): 113/53 Body Mass Index(BMI): 37.9 Temperature(F): 97.8 Respiratory Rate(breaths/min): 20 [1:Photos:] [N/A:N/A] Right Gluteus Right Calcaneus N/A Wound Location: Pressure Injury Pressure Injury N/A Wounding Event: Pressure Ulcer Pressure Ulcer N/A Primary Etiology: Chronic sinus problems/congestion, Chronic sinus problems/congestion, N/A Comorbid History: Sleep Apnea, Congestive Heart Sleep Apnea, Congestive Heart Failure, Raynauds, Rheumatoid Failure, Raynauds, Rheumatoid Arthritis, Neuropathy Arthritis, Neuropathy 08/11/2022 09/21/2022 N/A Date Acquired: 41 16 N/A Weeks of Treatment: Open Open N/A Wound Status: No No N/A Wound Recurrence: 4x9x6.2 2x4.3x1.2 N/A Measurements L x W x D (cm) 28.274 6.754 N/A A (cm) :  rea 175.301 8.105 N/A Volume (cm) : -28.60% 30.10% N/A % Reduction in A rea: -32.90% -739.00% N/A % Reduction in Volume: 1 Starting Position 1 (o'clock): 2 Ending Position 1 (o'clock): 11.1 Maximum Distance 1 (cm): Yes No N/A Undermining: Category/Stage IV Category/Stage IV N/A Classification: Large Large N/A Exudate A mount: Serosanguineous Serosanguineous N/A Exudate Type: red, brown red, brown N/A Exudate Color: Well defined, not attached Distinct, outline attached N/A Wound MarginJULIEA, EPPINGER ([id number]) [id number] Page 3 of 9 Medium (34-66%) Medium (34-66%) N/A Granulation Amount: Pink, Pale Red, Hyper-granulation N/A Granulation Quality: Medium (34-66%) Medium (34-66%) N/A Necrotic Amount: Fat Layer (Subcutaneous Tissue): Yes Fat Layer (Subcutaneous Tissue): Yes N/A Exposed Structures: Fascia: No Tendon: Yes Tendon: No Bone: Yes Muscle: No Fascia: No Joint: No Muscle: No Bone: No Joint: No None None N/A Epithelialization: Debridement - Selective/Open Wound Debridement - Excisional  N/A Debridement: Pre-procedure Verification/Time Out 14:55 14:55 N/A Taken: Lidocaine 4% Topical Solution Lidocaine 4% Topical Solution N/A Pain Control: Slough Tendon, Subcutaneous, Slough N/A Tissue Debrided: Non-Viable Tissue Skin/Subcutaneous Tissue/Muscle N/A Level: 2.83 6.75 N/A Debridement A (sq cm): rea Curette Curette N/A Instrument: Minimum Minimum N/A Bleeding: Pressure Pressure N/A Hemostasis A chieved: 5 5 N/A Procedural Pain: 3 3 N/A Post Procedural Pain: Procedure was tolerated well Procedure was tolerated well N/A Debridement Treatment Response: 4x9x6.2 2x4.3x1.2 N/A Post Debridement Measurements L x W x D (cm) 175.301 8.105 N/A Post Debridement Volume: (cm) Category/Stage IV Category/Stage IV N/A Post Debridement Stage: Excoriation: Yes No Abnormalities Noted N/A Periwound Skin Texture: Induration: No Callus: No Crepitus: No Rash: No Scarring: No Maceration: No Maceration: Yes N/A Periwound Skin Moisture: Dry/Scaly: No Dry/Scaly: No Atrophie Blanche: No No Abnormalities Noted N/A Periwound Skin Color: Cyanosis: No Ecchymosis: No Erythema: No Hemosiderin Staining: No Mottled: No Pallor: No Rubor: No No Abnormality No Abnormality N/A Temperature: Yes Yes N/A Tenderness on Palpation: Debridement Debridement N/A Procedures Performed: Treatment Notes Electronic Signature(s) Signed: 01/24/2023 3:25:29 PM By: Duanne Guess MD FACS Entered By: Duanne Guess on 01/24/2023 15:25:29 -------------------------------------------------------------------------------- Multi-Disciplinary Care Plan Details Patient Name: Date of Service: [name] 01/24/2023 2:00 PM Medical Record Number: [id number] Patient Account Number: [id number] Date of Birth/Sex: Treating RN: 1951/03/29 (72 y.o. [name] Primary Care Katja Blue: Vincent Gros Other Clinician: Referring Alona Danford: Treating Iniya Matzek/Extender: Greer Pickerel in Treatment: 19 Multidisciplinary Care Plan reviewed with physician Active Inactive Pressure Nursing Diagnoses: Knowledge deficit related to management of pressures ulcers Goals: Patient will remain free of pressure ulcers Date Initiated: 09/15/2022 Date Inactivated: 09/28/2022 Target Resolution Date: 11/09/2022 Unmet Reason: new pressure ulcer Goal Status: Unmet right heel TRYNITI, MOORER ([id number]) [id number] Page 4 of 9 Patient/caregiver will verbalize risk factors for pressure ulcer development Date Initiated: 09/15/2022 Target Resolution Date: 02/09/2023 Goal Status: Active Interventions: Assess potential for pressure ulcer upon admission and as needed Treatment Activities: Patient referred for pressure reduction/relief devices : 09/15/2022 Pressure reduction/relief device ordered : 09/15/2022 Notes: Wound/Skin Impairment Nursing Diagnoses: Impaired tissue integrity Knowledge deficit related to ulceration/compromised skin integrity Goals: Patient/caregiver will verbalize understanding of skin care regimen Date Initiated: 09/28/2022 Target Resolution Date: 02/09/2023 Goal Status: Active Ulcer/skin breakdown will have a volume reduction of 50% by week 8 Date Initiated: 09/28/2022 Date Inactivated: 11/01/2022 Target Resolution Date: 11/02/2022 Goal Status: Met Ulcer/skin breakdown will have a volume reduction of 80% by week 12 Date Initiated: 11/01/2022 Date Inactivated: 11/29/2022 Target Resolution Date: 11/29/2022 Unmet Reason: vac in place, still Goal Status: Unmet debriding heel Interventions: Assess  patient/caregiver ability to obtain necessary supplies Assess patient/caregiver ability to perform ulcer/skin care regimen upon admission and as needed Assess ulceration(s) every visit Provide education on ulcer and skin care Treatment Activities: Skin care regimen initiated : 09/28/2022 Topical wound management initiated :  09/28/2022 Notes: Electronic Signature(s) Signed: 01/24/2023 4:59:51 PM By: Zenaida Deed RN, BSN Entered By: Zenaida Deed on 01/24/2023 14:48:23 -------------------------------------------------------------------------------- Pain Assessment Details Patient Name: Date of Service: [name] 01/24/2023 2:00 PM Medical Record Number: [id number] Patient Account Number: [id number] Date of Birth/Sex: Treating RN: 1951-04-06 (72 y.o. [name] Primary Care Nowell Sites: Vincent Gros Other Clinician: Referring Kallin Henk: Treating Viliami Bracco/Extender: Greer Pickerel in Treatment: 19 Active Problems Location of Pain Severity and Description of Pain Patient Has Paino Yes Site Locations Pain Location: LEELYNN, TWIDWELL ([id number]) [phone].pdf Page 5 of 9 Pain Location: Pain in Ulcers With Dressing Change: Yes Duration of the Pain. Constant / Intermittento Intermittent Rate the pain. Current Pain Level: 3 Worst Pain Level: 6 Least Pain Level: 0 Character of Pain Describe the Pain: Aching, Burning, Tender Pain Management and Medication Current Pain Management: Medication: Yes Other: reposition Is the Current Pain Management Adequate: Adequate How does your wound impact your activities of daily livingo Sleep: Yes Bathing: No Appetite: No Relationship With Others: No Bladder Continence: No Emotions: No Bowel Continence: No Hobbies: No Toileting: No Dressing: No Electronic Signature(s) Signed: 01/24/2023 4:59:51 PM By: Zenaida Deed RN, BSN Entered By: Zenaida Deed on 01/24/2023 14:47:50 -------------------------------------------------------------------------------- Patient/Caregiver Education Details Patient Name: Date of Service: [name] 6/12/2024andnbsp2:00 PM Medical Record Number: [id number] Patient Account Number: [id number] Date of Birth/Gender: Treating RN: Mar 22, 1951 (72 y.o. [name] Primary Care Physician: Vincent Gros Other Clinician: Referring Physician: Treating Physician/Extender: Greer Pickerel in Treatment: 77 Education Assessment Education Provided To: Patient Education Topics Provided Pressure: Methods: Explain/Verbal Responses: Reinforcements needed, State content correctly Wound/Skin Impairment: Methods: Explain/Verbal Responses: Reinforcements needed, State content correctly Electronic Signature(s) Signed: 01/24/2023 4:59:51 PM By: Zenaida Deed RN, BSN Entered By: Zenaida Deed on 01/24/2023 14:48:44 [name] ([id number]) [id number] Page 6 of 9 -------------------------------------------------------------------------------- Wound Assessment Details Patient Name: Date of Service: ANJELA, WHITSEL 01/24/2023 2:00 PM Medical Record Number: [id number] Patient Account Number: [id number] Date of Birth/Sex: Treating RN: 08/07/1951 (72 y.o. 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 Wound Open Wounding Event: Pressure Injury Status: Date Acquired: 08/11/2022 Comorbid Chronic sinus problems/congestion, Sleep Apnea, Congestive Heart Weeks Of Treatment: 19 History: Failure, Raynauds, Rheumatoid Arthritis, Neuropathy Clustered Wound: No Photos Wound Measurements Length: (cm) 4 Width: (cm) 9 Depth: (cm) 6.2 Area: (cm) 28.274 Volume: (cm) 175.301 % Reduction in Area: -28.6% % Reduction in Volume: -32.9% Epithelialization: None Tunneling: No Undermining: Yes Starting Position (o'clock): 1 Ending Position (o'clock): 2 Maximum Distance: (cm) 11.1 Wound Description Classification: Category/Stage IV Wound Margin: Well defined, not attached Exudate  Amount: Large Exudate Type: Serosanguineous Exudate Color: red, brown Foul Odor After Cleansing: No Slough/Fibrino Yes Wound Bed Granulation Amount: Medium (34-66%) Exposed Structure Granulation Quality: Pink, Pale Fascia Exposed: No Necrotic Amount: Medium (34-66%) Fat Layer (Subcutaneous Tissue) Exposed: Yes Necrotic Quality: Adherent Slough Tendon Exposed: No Muscle Exposed: No Joint Exposed: No Bone Exposed: No Periwound Skin Texture Texture Color No Abnormalities Noted: No No Abnormalities Noted: Yes Callus: No Temperature / Pain Crepitus: No Temperature:  No Abnormality Excoriation: Yes Tenderness on Palpation: Yes Induration: No Rash: No Scarring: No Moisture No Abnormalities Noted: Yes Treatment Notes SHIRLETTA, SARSFIELD ([id number]) [id number] Page 7 of 9 Wound #1 (Gluteus) Wound Laterality: Right Cleanser Wound Cleanser Discharge Instruction: Cleanse the wound with wound cleanser prior to applying a clean dressing using gauze sponges, not tissue or cotton balls. Peri-Wound Care Nystop Powder (Nystatin) Discharge Instruction: Apply Nystop (Nystatin) Powder mix with zinc oxide to irritated periwound skin Zinc Oxide Ointment 30g tube Discharge Instruction: Apply Zinc Oxide to periwound with each dressing change Topical Primary Dressing Vashe Discharge Instruction: moisten gauze with Vashe and pack into wound Secondary Dressing ABD Pad, 8x10 Discharge Instruction: Apply over primary dressing as directed. Secured With Paper Tape, 2x10 (in/yd) Discharge Instruction: Secure dressing with tape as directed. Compression Wrap Compression Stockings Add-Ons Electronic Signature(s) Signed: 01/24/2023 4:59:51 PM By: Zenaida Deed RN, BSN Entered By: Zenaida Deed on 01/24/2023 14:45:05 -------------------------------------------------------------------------------- Wound Assessment Details Patient Name: Date of Service: [name]  01/24/2023 2:00 PM Medical Record Number: [id number] Patient Account Number: [id number] Date of Birth/Sex: Treating RN: Apr 23, 1951 (72 y.o. 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 Wound Open Wounding Event: Pressure Injury Status: Date Acquired: 09/21/2022 Comorbid Chronic sinus problems/congestion, Sleep Apnea, Congestive Heart Weeks Of Treatment: 16 History: Failure, Raynauds, Rheumatoid Arthritis, Neuropathy Clustered Wound: No Photos Wound Measurements Length: (cm) 2 Width: (cm) 4.3 Depth: (cm) 1.2 AYZIA, LEWERENZ E ([id number]) Area: (cm) 6.754 Volume: (cm) 8.105 % Reduction in Area: 30.1% % Reduction in Volume: -739% Epithelialization: None [id number] Page 8 of 9 Tunneling: No Undermining: No Wound Description Classification: Category/Stage IV Wound Margin: Distinct, outline attached Exudate Amount: Large Exudate Type: Serosanguineous Exudate Color: red, brown Foul Odor After Cleansing: No Slough/Fibrino Yes Wound Bed Granulation Amount: Medium (34-66%) Exposed Structure Granulation Quality: Red, Hyper-granulation Fascia Exposed: No Necrotic Amount: Medium (34-66%) Fat Layer (Subcutaneous Tissue) Exposed: Yes Necrotic Quality: Adherent Slough Tendon Exposed: Yes Muscle Exposed: No Joint Exposed: No Bone Exposed: Yes Periwound Skin Texture Texture Color No Abnormalities Noted: Yes No Abnormalities Noted: Yes Moisture Temperature / Pain No Abnormalities Noted: No Temperature: No Abnormality Dry / Scaly: No Tenderness on Palpation: Yes Maceration: Yes Treatment Notes Wound #3 (Calcaneus) Wound Laterality: Right Cleanser Peri-Wound Care Zinc Oxide Ointment 30g tube Discharge Instruction: Apply Zinc  Oxide to periwound with each dressing change Topical Gentamicin Discharge Instruction: mix 50:50 with mupirocin and rub into wound bed Mupirocin Ointment Discharge Instruction: Apply Mupirocin (Bactroban) as instructed Primary Dressing Maxorb Extra Ag+ Alginate Dressing, 4x4.75 (in/in) Discharge Instruction: Apply to wound bed as instructed Secondary Dressing ALLEVYN Heel 4 1/2in x 5 1/2in / 10.5cm x 13.5cm Discharge Instruction: Apply over primary dressing as directed. Woven Gauze Sponge, Non-Sterile 4x4 in Discharge Instruction: Apply over primary dressing as directed. Secured With [employer], 4.5x3.1 (in/yd) Discharge Instruction: Secure with Kerlix as directed. Compression Wrap Compression Stockings Add-Ons Electronic Signature(s) Signed: 01/24/2023 4:59:51 PM By: Zenaida Deed RN, BSN Entered By: Zenaida Deed on 01/24/2023 14:33:05 -------------------------------------------------------------------------------- Vitals Details Patient Name: Date of Service: [name] 01/24/2023 2:00 PM [name] ([id number]) [id number] Page 9 of 9 Medical Record Number: [id number] Patient Account Number: [id number] Date of Birth/Sex: Treating RN: 1950-09-12 (72 y.o. [name] Primary Care Mayra Brahm: Vincent Gros Other Clinician: Referring Kaedynce Tapp: Treating Vickie Melnik/Extender: Raynelle Chary,  Heather Weeks in Treatment: 19 Vital Signs Time Taken: 14:19 Temperature (F): 97.8 Height (in): 62 Pulse (bpm): 75 Weight (lbs): 207 Respiratory Rate (breaths/min): 20 Body Mass Index (BMI): 37.9 Blood Pressure (mmHg): 113/53 Reference Range: 80 - 120 mg / dl Electronic Signature(s) Signed: 01/24/2023 4:59:51 PM By: Zenaida Deed RN, BSN Entered By: Zenaida Deed on 01/24/2023 14:19:50

## 2023-01-25 NOTE — Progress Notes (Signed)
FLORECE, SPIESS ([id number]) [id number] Page 1 of 12 Visit Report for 01/24/2023 Chief Complaint Document Details Patient Name: Date of Service: [name], [name] 01/24/2023 2:00 PM Medical Record Number: [id number] Patient Account Number: [id number] Date of Birth/Sex: Treating RN: 20-Feb-1951 (72 y.o. F) Primary Care Provider: Vincent Gros Other Clinician: Referring Provider: Treating Provider/Extender: Greer Pickerel in Treatment: 37 Information Obtained from: Patient Chief Complaint Patient is at the clinic for treatment of open pressure ulcers Electronic Signature(s) Signed: 01/24/2023 3:25:36 PM By: Duanne Guess MD FACS Entered By: Duanne Guess on 01/24/2023 15:25:36 -------------------------------------------------------------------------------- Debridement Details Patient Name: Date of Service: Marisa Roux E. 01/24/2023 2:00 PM Medical Record Number: [id number] Patient Account Number: [id number] Date of Birth/Sex: Treating RN: [birth year] (72 y.o. [name] Primary Care Provider: Vincent Gros Other Clinician: Referring Provider: Treating Provider/Extender: Greer Pickerel in Treatment: 19 Debridement Performed for Assessment: Wound #3 Right Calcaneus Performed By: Physician Duanne Guess, MD Debridement Type: Debridement Level of Consciousness (Pre-procedure): Awake and Alert Pre-procedure Verification/Time Out Yes - 14:55 Taken: Start Time: 14:55 Pain Control: Lidocaine 4% T opical Solution Percent of Wound Bed Debrided: 100% T Area Debrided (cm): otal 6.75 Tissue and other material debrided: Viable, Non-Viable, Slough, Subcutaneous, Tendon, Slough Level: Skin/Subcutaneous Tissue/Muscle Debridement Description: Excisional Instrument: Curette Bleeding: Minimum Hemostasis Achieved: Pressure Procedural Pain: 5 Post Procedural Pain: 3 Response to Treatment: Procedure was  tolerated well Level of Consciousness (Post- Awake and Alert procedure): Post Debridement Measurements of Total Wound Length: (cm) 2 Stage: Category/Stage IV Width: (cm) 4.3 Depth: (cm) 1.2 Volume: (cm) 8.105 Character of Wound/Ulcer Post Debridement: Requires Further Debridement Post Procedure Diagnosis Same as Pre-procedure Notes scribed for Dr. Lady Gary by Zenaida Deed, RN Electronic Signature(s) Signed: 01/24/2023 4:16:31 PM By: Duanne Guess MD FACS Signed: 01/24/2023 4:59:51 PM By: Zenaida Deed RN, BSN Davenport, Tawny Asal ([id number]) [id number] Page 2 of 12 Entered By: Zenaida Deed on 01/24/2023 15:07:46 -------------------------------------------------------------------------------- Debridement Details Patient Name: Date of Service: [name], [name] 01/24/2023 2:00 PM Medical Record Number: [id number] Patient Account Number: [id number] Date of Birth/Sex: Treating RN: 05-14-1951 (72 y.o. [name] Primary Care Provider: Vincent Gros Other Clinician: Referring Provider: Treating Provider/Extender: Greer Pickerel in Treatment: 19 Debridement Performed for Assessment: Wound #1 Right Gluteus Performed By: Physician Duanne Guess, MD Debridement Type: Debridement Level of Consciousness (Pre-procedure): Awake and Alert Pre-procedure Verification/Time Out Yes - 14:55 Taken: Start Time: 14:55 Pain Control: Lidocaine 4% T opical Solution Percent of Wound Bed Debrided: 10% T Area Debrided (cm): otal 2.83 Tissue and other material debrided: Non-Viable, Slough, Slough Level: Non-Viable Tissue Debridement Description: Selective/Open Wound Instrument: Curette Bleeding: Minimum Hemostasis Achieved: Pressure Procedural Pain: 5 Post Procedural Pain: 3 Response to Treatment: Procedure was tolerated well Level of Consciousness (Post- Awake and Alert procedure): Post Debridement Measurements of Total  Wound Length: (cm) 4 Stage: Category/Stage IV Width: (cm) 9 Depth: (cm) 6.2 Volume: (cm) 175.301 Character of Wound/Ulcer Post Debridement: Requires Further Debridement Post Procedure Diagnosis Same as Pre-procedure Notes scribed for Dr. Lady Gary by Zenaida Deed, RN Electronic Signature(s) Signed: 01/24/2023 4:16:31 PM By: Duanne Guess MD FACS Signed: 01/24/2023 4:59:51 PM By: Zenaida Deed RN, BSN Entered By: Zenaida Deed on 01/24/2023 15:08:14 -------------------------------------------------------------------------------- HPI Details Patient Name: Date of Service: Marisa Roux E. 01/24/2023 2:00 PM Medical Record Number: [id number] Patient Account Number: [id number] Date of Birth/Sex: Treating RN: [birth year] (72 y.o. F) Primary Care Provider: Vincent Gros Other Clinician: Referring Provider: Treating Provider/Extender: Duanne Guess  Vincent Gros Weeks in Treatment: 19 History of Present Illness HPI Description: ADMISSION 09/07/2023 This is a 72 year old woman with a past medical history notable for obesity, congestive heart failure, Raynaud's syndrome, CKD stage IIIb, osteoporosis, and rheumatoid arthritis. In November 2023, she suffered a fall that resulted in a femur fracture. She was hospitalized for about a week and underwent surgical repair of the fracture. She subsequently developed pressure ulcers on her right buttocks and ischium. She has been receiving home health services and they have been applying Medihoney. They have been reporting the wounds as stage II, but on evaluation, the large ulcer was probably unstageable at the time they were evaluating it but it is clearly a stage IV at this point. The smaller ulcer does have fat layer exposure and therefore is a stage III. The patient is accompanied by her daughter. She says she has been sleeping on a regular bed but recently ordered an air mattress T opper, but does not have it yet. She is [name], [name]  ([id number]) [id number] Page 3 of 12 on Ozempic for weight loss and therefore has a poor appetite and struggles to get adequate protein intake. 09/15/2022: The large stage IV ulcer is substantially cleaner this week. The stage III ulcer is a little smaller. Both have slough accumulation. The culture that I took last week was polymicrobial. The Augmentin that I prescribed was adequate coverage for the species and she continues to take this. We have also ordered Keystone topical antibiotic compound, but this has not yet arrived. 09/21/2022: The stage IV ulcer has some necrotic muscle at the base but is otherwise fairly clean. The stage III ulcer is smaller again with light slough on the surface. She has her Keystone topical antibiotic compound with her today. 09/29/2022: There is still some necrotic muscle at the base of the stage IV ulcer that I was unable to get to last week. The stage III ulcer has healed. She unfortunately has developed a new pressure ulcer on her right heel. 10/06/2022: Still with some devitalized muscle at the base of the stage IV ulcer, but otherwise this wound is looking quite clean. The pressure induced tissue injury on her heel is demarcating and much of the area appears to be epithelialized, but there is still 2 areas that remain questionable. 10/12/2022: The stage IV ulcer is very clean and ready for wound VAC, which will be delivered tomorrow according to the patient. The tissue injury on her heel is drying up and does not feel particularly boggy this week. 10/19/2022: The heel injury continues to improve. There is still some dry eschar present on the more plantar aspect of it. There is some slough accumulation on the surface of the stage IV pressure ulcer. The wound VAC was initiated by home health last week. 11/01/2022: The gluteal pressure ulcer is very clean without any necrotic tissue or slough. The depth has come in by over half a centimeter. The  deep tissue injury on her heel continues to improve. There is still dry eschar that we are painting with Betadine as well as some fresh-looking viable tissue at the margin. 11/15/2022: The gluteal pressure ulcer is clean and contracting. The eschar on her heel ulcer is beginning to separate from the underlying tissue. 11/29/2022: The gluteal pressure ulcer is very clean. The depth has come in by about a centimeter. The eschar has completely separated off of her heel. There is some fibrinous exudate and slough on the heel. It has declared itself as a  stage III at this point. 12/13/2022: The heel ulcer is smaller and much cleaner, but still has some non-viable tissue present. The orifice of the gluteal ulcer is contracting, but the depth remains the same. Fortunately, the sponge for the [facility] is being packed appropriately into the full depth of the wound. 12/27/2022: The heel ulcer is smaller, but I think the depth and degree of tissue injury has finally declared itself, with tendon being exposed at the posterior aspect of the ulcer. It is now stage IV. The gluteal ulcer continues to contract circumferentially, but the depth is still unchanged. 01/10/2023: The heel ulcer has contracted further. There is still rubbery slough on the surface. There has been no further tissue breakdown; I think the last of the nonviable tissue was removed at her visit 2 weeks ago. The gluteal ulcer has contracted further circumferentially, but there is still a tunnel that ankles off cranially that remains about the same depth. The tissues appear viable at both sites. 01/24/2023: Unfortunately, the home health nurse that was applying the wound VAC did not put any drape on her skin for the bridge and was applying sponge directly to the patient's skin. This is resulted in significant tissue breakdown and pain for the patient. Once we were notified of this, we discontinued the wound VAC and they have been packing the wound with saline  moistened gauze. The heel has also deteriorated. Bone is now exposed. Electronic Signature(s) Signed: 01/24/2023 3:27:14 PM By: Duanne Guess MD FACS Entered By: Duanne Guess on 01/24/2023 15:27:14 -------------------------------------------------------------------------------- Physical Exam Details Patient Name: Date of Service: Marisa Roux E. 01/24/2023 2:00 PM Medical Record Number: [id number] Patient Account Number: [id number] Date of Birth/Sex: Treating RN: 1951/05/15 (72 y.o. F) Primary Care Provider: Vincent Gros Other Clinician: Referring Provider: Treating Provider/Extender: Greer Pickerel in Treatment: 19 Constitutional . . . . no acute distress. Respiratory Normal work of breathing on room air. Notes 01/24/2023: The periwound of the gluteal ulcer is quite red and irritated. There are areas of superficial skin breakdown with slough accumulation. The tissue within the depths of the wound is more pale and the cavity is larger. The heel is macerated around the wound and bone is palpable. Electronic Signature(s) Signed: 01/24/2023 3:28:54 PM By: Duanne Guess MD FACS Entered By: Duanne Guess on 01/24/2023 15:28:54 -------------------------------------------------------------------------------- Physician Orders Details Patient Name: Date of Service: Marisa Roux E. 01/24/2023 2:00 PM Medical Record Number: [id number] Patient Account Number: [id number] Date of Birth/Sex: Treating RN: 02-09-1951 (72 y.o. [address], [address] [place], [place] E ([id number]) [phone].pdf Page 4 of 12 Primary Care Provider: Vincent Gros Other Clinician: Referring Provider: Treating Provider/Extender: Greer Pickerel in Treatment: 77 Verbal / Phone Orders: No Diagnosis Coding ICD-10 Coding Code Description L89.314 Pressure ulcer of right buttock, stage 4 L89.614 Pressure ulcer of right heel, stage 4 I73.00  Raynaud's syndrome without gangrene I50.32 Chronic diastolic (congestive) heart failure N18.32 Chronic kidney disease, stage 3b R73.02 Impaired glucose tolerance (oral) E66.9 Obesity, unspecified Follow-up Appointments ppointment in 1 week. - Dr. Lady Gary RM 1 Return A Tuesday 6/18 @ 2:45 Anesthetic Wound #1 Right Gluteus (In clinic) Topical Lidocaine 4% applied to wound bed Wound #3 Right Calcaneus (In clinic) Topical Lidocaine 4% applied to wound bed Bathing/ Shower/ Hygiene May shower and wash wound with soap and water. Negative Presssure Wound Therapy Wound #1 Right Gluteus Wound Vac to wound continuously at 112mm/hg pressure - Hold NPWT this week Black Foam Off-Loading Heel suspension boot to: -  globoped right foot to ambulate Low air-loss mattress (Group 2) - Adapt Turn and reposition every 2 hours - avoid lying on back, stand at least every hour while out of bed, float heels off bed with pillows under calves while in bed Prevalon Boot - right foot esp. while in bed. Additional Orders / Instructions Follow Nutritious Diet - increase protein to 70-80 gms per day, hold ozempic Juven Shake 1-2 times daily. Home Health New wound care orders this week; continue Home Health for wound care. May utilize formulary equivalent dressing for wound treatment orders unless otherwise specified. Other Home Health Orders/Instructions: - [facility] Wound Treatment Wound #1 - Gluteus Wound Laterality: Right Cleanser: Wound Cleanser 1 x Per Day/30 Days Discharge Instructions: Cleanse the wound with wound cleanser prior to applying a clean dressing using gauze sponges, not tissue or cotton balls. Peri-Wound Care: Nystop Powder (Nystatin) 1 x Per Day/30 Days Discharge Instructions: Apply Nystop (Nystatin) Powder mix with zinc oxide to irritated periwound skin Peri-Wound Care: Zinc Oxide Ointment 30g tube 1 x Per Day/30 Days Discharge Instructions: Apply Zinc Oxide to periwound with each dressing  change Prim Dressing: Vashe 1 x Per Day/30 Days ary Discharge Instructions: moisten gauze with Vashe and pack into wound Secondary Dressing: ABD Pad, 8x10 1 x Per Day/30 Days Discharge Instructions: Apply over primary dressing as directed. Secured With: Paper Tape, 2x10 (in/yd) 1 x Per Day/30 Days Discharge Instructions: Secure dressing with tape as directed. Wound #3 - Calcaneus Wound Laterality: Right Peri-Wound Care: Zinc Oxide Ointment 30g tube 1 x Per Day/30 Days Discharge Instructions: Apply Zinc Oxide to periwound with each dressing change LEAUNDRA, EERNISSE ([id number]) [id number] Page 5 of 12 Topical: Gentamicin 1 x Per Day/30 Days Discharge Instructions: mix 50:50 with mupirocin and rub into wound bed Topical: Mupirocin Ointment 1 x Per Day/30 Days Discharge Instructions: Apply Mupirocin (Bactroban) as instructed Prim Dressing: Maxorb Extra Ag+ Alginate Dressing, 4x4.75 (in/in) 1 x Per Day/30 Days ary Discharge Instructions: Apply to wound bed as instructed Secondary Dressing: ALLEVYN Heel 4 1/2in x 5 1/2in / 10.5cm x 13.5cm 1 x Per Day/30 Days Discharge Instructions: Apply over primary dressing as directed. Secondary Dressing: Woven Gauze Sponge, Non-Sterile 4x4 in 1 x Per Day/30 Days Discharge Instructions: Apply over primary dressing as directed. Secured With: [employer], 4.5x3.1 (in/yd) 1 x Per Day/30 Days Discharge Instructions: Secure with Kerlix as directed. Laboratory naerobe culture (MICRO) - PCR culture Bacteria identified in Unspecified specimen by A LOINC Code: 635-3 Convenience Name: Anaerobic culture Patient Medications llergies: Sulfa (Sulfonamide Antibiotics), Cymbalta, Demerol, Iodinated Contrast Media, morphine, sulfasalazine, adhesive A Notifications Medication Indication Start End 01/24/2023 amoxicillin-pot clavulanate DOSE oral 875 mg-125 mg tablet - 1 tab p.o. twice daily x 10 days 01/24/2023 gentamicin DOSE topical  0.1 % ointment - Apply to heel wound with dressing changes as directed 01/24/2023 mupirocin DOSE topical 2 % ointment - Apply to heel wound with dressing changes as directed Electronic Signature(s) Signed: 01/24/2023 4:16:31 PM By: Duanne Guess MD FACS Previous Signature: 01/24/2023 3:32:30 PM Version By: Duanne Guess MD FACS Entered By: Duanne Guess on 01/24/2023 15:34:24 -------------------------------------------------------------------------------- Problem List Details Patient Name: Date of Service: Marisa Roux E. 01/24/2023 2:00 PM Medical Record Number: [id number] Patient Account Number: [id number] Date of Birth/Sex: Treating RN: 1950/12/11 (72 y.o. [name] Primary Care Provider: Vincent Gros Other Clinician: Referring Provider: Treating Provider/Extender: Greer Pickerel in Treatment: 78 Active Problems ICD-10 Encounter Code Description Active Date MDM Diagnosis L89.314 Pressure ulcer  of right buttock, stage 4 09/07/2022 No Yes L89.614 Pressure ulcer of right heel, stage 4 09/28/2022 No Yes I73.00 Raynaud's syndrome without gangrene 09/07/2022 No Yes I50.32 Chronic diastolic (congestive) heart failure 09/07/2022 No Yes [name], [name] ([id number]) [id number] Page 6 of 12 N18.32 Chronic kidney disease, stage 3b 09/07/2022 No Yes R73.02 Impaired glucose tolerance (oral) 09/07/2022 No Yes E66.9 Obesity, unspecified 09/07/2022 No Yes Inactive Problems ICD-10 Code Description Active Date Inactive Date L89.313 Pressure ulcer of right buttock, stage 3 09/07/2022 09/07/2022 Resolved Problems Electronic Signature(s) Signed: 01/24/2023 3:23:31 PM By: Duanne Guess MD FACS Entered By: Duanne Guess on 01/24/2023 15:23:31 -------------------------------------------------------------------------------- Progress Note Details Patient Name: Date of Service: Marisa Roux E. 01/24/2023 2:00 PM Medical Record  Number: [id number] Patient Account Number: [id number] Date of Birth/Sex: Treating RN: February 02, 1951 (72 y.o. F) Primary Care Provider: Vincent Gros Other Clinician: Referring Provider: Treating Provider/Extender: Greer Pickerel in Treatment: 4 Subjective Chief Complaint Information obtained from Patient Patient is at the clinic for treatment of open pressure ulcers History of Present Illness (HPI) ADMISSION 09/07/2023 This is a 71 year old woman with a past medical history notable for obesity, congestive heart failure, Raynaud's syndrome, CKD stage IIIb, osteoporosis, and rheumatoid arthritis. In November 2023, she suffered a fall that resulted in a femur fracture. She was hospitalized for about a week and underwent surgical repair of the fracture. She subsequently developed pressure ulcers on her right buttocks and ischium. She has been receiving home health services and they have been applying Medihoney. They have been reporting the wounds as stage II, but on evaluation, the large ulcer was probably unstageable at the time they were evaluating it but it is clearly a stage IV at this point. The smaller ulcer does have fat layer exposure and therefore is a stage III. The patient is accompanied by her daughter. She says she has been sleeping on a regular bed but recently ordered an air mattress T opper, but does not have it yet. She is on Ozempic for weight loss and therefore has a poor appetite and struggles to get adequate protein intake. 09/15/2022: The large stage IV ulcer is substantially cleaner this week. The stage III ulcer is a little smaller. Both have slough accumulation. The culture that I took last week was polymicrobial. The Augmentin that I prescribed was adequate coverage for the species and she continues to take this. We have also ordered Keystone topical antibiotic compound, but this has not yet arrived. 09/21/2022: The stage IV ulcer has some necrotic  muscle at the base but is otherwise fairly clean. The stage III ulcer is smaller again with light slough on the surface. She has her Keystone topical antibiotic compound with her today. 09/29/2022: There is still some necrotic muscle at the base of the stage IV ulcer that I was unable to get to last week. The stage III ulcer has healed. She unfortunately has developed a new pressure ulcer on her right heel. 10/06/2022: Still with some devitalized muscle at the base of the stage IV ulcer, but otherwise this wound is looking quite clean. The pressure induced tissue injury on her heel is demarcating and much of the area appears to be epithelialized, but there is still 2 areas that remain questionable. 10/12/2022: The stage IV ulcer is very clean and ready for wound VAC, which will be delivered tomorrow according to the patient. The tissue injury on her heel is drying up and does not feel particularly boggy this week. 10/19/2022: The heel injury  continues to improve. There is still some dry eschar present on the more plantar aspect of it. There is some slough accumulation on the surface of the stage IV pressure ulcer. The wound VAC was initiated by home health last week. 11/01/2022: The gluteal pressure ulcer is very clean without any necrotic tissue or slough. The depth has come in by over half a centimeter. The deep tissue injury on her heel continues to improve. There is still dry eschar that we are painting with Betadine as well as some fresh-looking viable tissue at the margin. [name], [name] ([id number]) [id number] Page 7 of 12 11/15/2022: The gluteal pressure ulcer is clean and contracting. The eschar on her heel ulcer is beginning to separate from the underlying tissue. 11/29/2022: The gluteal pressure ulcer is very clean. The depth has come in by about a centimeter. The eschar has completely separated off of her heel. There is some fibrinous exudate and slough on the heel. It  has declared itself as a stage III at this point. 12/13/2022: The heel ulcer is smaller and much cleaner, but still has some non-viable tissue present. The orifice of the gluteal ulcer is contracting, but the depth remains the same. Fortunately, the sponge for the [facility] is being packed appropriately into the full depth of the wound. 12/27/2022: The heel ulcer is smaller, but I think the depth and degree of tissue injury has finally declared itself, with tendon being exposed at the posterior aspect of the ulcer. It is now stage IV. The gluteal ulcer continues to contract circumferentially, but the depth is still unchanged. 01/10/2023: The heel ulcer has contracted further. There is still rubbery slough on the surface. There has been no further tissue breakdown; I think the last of the nonviable tissue was removed at her visit 2 weeks ago. The gluteal ulcer has contracted further circumferentially, but there is still a tunnel that ankles off cranially that remains about the same depth. The tissues appear viable at both sites. 01/24/2023: Unfortunately, the home health nurse that was applying the wound VAC did not put any drape on her skin for the bridge and was applying sponge directly to the patient's skin. This is resulted in significant tissue breakdown and pain for the patient. Once we were notified of this, we discontinued the wound VAC and they have been packing the wound with saline moistened gauze. The heel has also deteriorated. Bone is now exposed. Patient History Information obtained from Patient, Caregiver, Chart. Family History Cancer - Father,Paternal Grandparents, Heart Disease - Mother,Father,Paternal Grandparents, Hypertension - Mother, Stroke - Maternal Grandparents, No family history of Diabetes, Hereditary Spherocytosis, Kidney Disease, Lung Disease, Seizures, Thyroid Problems, Tuberculosis. Social History Never smoker, Marital Status - Widowed, Alcohol Use - Rarely, Drug Use - Prior  History - TCH, Caffeine Use - Daily - soda, tea. Medical History Eyes Denies history of Cataracts, Glaucoma, Optic Neuritis Ear/Nose/Mouth/Throat Patient has history of Chronic sinus problems/congestion - chronic rhinitis Denies history of Middle ear problems Respiratory Patient has history of Sleep Apnea - uses CPAP Cardiovascular Patient has history of Congestive Heart Failure Endocrine Denies history of Type I Diabetes, Type II Diabetes Genitourinary Denies history of End Stage Renal Disease Immunological Patient has history of Raynauds Denies history of Lupus Erythematosus, Scleroderma Integumentary (Skin) Denies history of History of Burn Musculoskeletal Patient has history of Rheumatoid Arthritis Denies history of Gout, Osteoarthritis, Osteomyelitis Neurologic Patient has history of Neuropathy Denies history of Dementia, Quadriplegia, Paraplegia, Seizure Disorder Oncologic Denies history of Received Chemotherapy, Received  Radiation Psychiatric Denies history of Anorexia/bulimia, Confinement Anxiety Hospitalization/Surgery History - right femur fx ORIR. - right knee replacement. - left breast mass excision. - left ankle tibia repair. - abdominal hysterectomy. - adnoidectomy/tonsillectomy. - cholecystectomy. - dental implants. - patoid cystectomy. - tubal ligation. Medical A Surgical History Notes nd Constitutional Symptoms (General Health) morbid obesity Cardiovascular hyperlipidemia Gastrointestinal GERD, IBS, eosinophilic esophagitis, diverticulosis, Genitourinary CKD stage 3 Immunological Sjoegren syndrome, fibromyalgia Objective Constitutional no acute distress. Vitals Time Taken: 2:19 PM, Height: 62 in, Weight: 207 lbs, BMI: 37.9, Temperature: 97.8 F, Pulse: 75 bpm, Respiratory Rate: 20 breaths/min, Blood Pressure: [name], [name] ([id number]) [id number] Page 8 of 12 113/53 mmHg. Respiratory Normal work of breathing on room  air. General Notes: 01/24/2023: The periwound of the gluteal ulcer is quite red and irritated. There are areas of superficial skin breakdown with slough accumulation. The tissue within the depths of the wound is more pale and the cavity is larger. The heel is macerated around the wound and bone is palpable. Integumentary (Hair, Skin) Wound #1 status is Open. Original cause of wound was Pressure Injury. The date acquired was: 08/11/2022. The wound has been in treatment 19 weeks. The wound is located on the Right Gluteus. The wound measures 4cm length x 9cm width x 6.2cm depth; 28.274cm^2 area and 175.301cm^3 volume. There is Fat Layer (Subcutaneous Tissue) exposed. There is no tunneling noted, however, there is undermining starting at 1:00 and ending at 2:00 with a maximum distance of 11.1cm. There is a large amount of serosanguineous drainage noted. The wound margin is well defined and not attached to the wound base. There is medium (34-66%) pink, pale granulation within the wound bed. There is a medium (34-66%) amount of necrotic tissue within the wound bed including Adherent Slough. The periwound skin appearance had no abnormalities noted for moisture. The periwound skin appearance had no abnormalities noted for color. The periwound skin appearance exhibited: Excoriation. The periwound skin appearance did not exhibit: Callus, Crepitus, Induration, Rash, Scarring. Periwound temperature was noted as No Abnormality. The periwound has tenderness on palpation. Wound #3 status is Open. Original cause of wound was Pressure Injury. The date acquired was: 09/21/2022. The wound has been in treatment 16 weeks. The wound is located on the Right Calcaneus. The wound measures 2cm length x 4.3cm width x 1.2cm depth; 6.754cm^2 area and 8.105cm^3 volume. There is bone, tendon, and Fat Layer (Subcutaneous Tissue) exposed. There is no tunneling or undermining noted. There is a large amount of serosanguineous  drainage noted. The wound margin is distinct with the outline attached to the wound base. There is medium (34-66%) red, hyper - granulation within the wound bed. There is a medium (34-66%) amount of necrotic tissue within the wound bed including Adherent Slough. The periwound skin appearance had no abnormalities noted for texture. The periwound skin appearance had no abnormalities noted for color. The periwound skin appearance exhibited: Maceration. The periwound skin appearance did not exhibit: Dry/Scaly. Periwound temperature was noted as No Abnormality. The periwound has tenderness on palpation. 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 There was a Selective/Open Wound Non-Viable Tissue Debridement with a total area of 2.83 sq cm performed by Duanne Guess, MD. With the following instrument(s):  Curette to remove Non-Viable tissue/material. Material removed includes Slough after achieving pain control using Lidocaine 4% Topical Solution. A time out was conducted at 14:55, prior to the start of the procedure. A Minimum amount of bleeding was controlled with Pressure. The procedure was tolerated well with a pain level of 5 throughout and a pain level of 3 following the procedure. Post Debridement Measurements: 4cm length x 9cm width x 6.2cm depth; 175.301cm^3 volume. Post debridement Stage noted as Category/Stage IV. Character of Wound/Ulcer Post Debridement requires further debridement. Post procedure Diagnosis Wound #1: Same as Pre-Procedure General Notes: scribed for Dr. Lady Gary by Zenaida Deed, RN. Wound #3 Pre-procedure diagnosis of Wound  #3 is a Pressure Ulcer located on the Right Calcaneus . There was a Excisional Skin/Subcutaneous Tissue/Muscle Debridement with a total area of 6.75 sq cm performed by Duanne Guess, MD. With the following instrument(s): Curette to remove Viable and Non-Viable tissue/material. Material removed includes T endon, Subcutaneous Tissue, and Slough after achieving pain control using Lidocaine 4% Topical Solution. A time out was conducted at 14:55, prior to the start of the procedure. A Minimum amount of bleeding was controlled with Pressure. The procedure was tolerated well with a pain level of 5 throughout and a pain level of 3 following the procedure. Post Debridement Measurements: 2cm length x 4.3cm width x 1.2cm depth; 8.105cm^3 volume. Post debridement Stage noted as Category/Stage IV. Character of Wound/Ulcer Post Debridement requires further debridement. Post procedure Diagnosis Wound #3: Same as Pre-Procedure General Notes: scribed for Dr. Lady Gary by Zenaida Deed, RN. Plan Follow-up Appointments: Return Appointment in 1 week. - Dr. Lady Gary RM 1 Tuesday 6/18 @ 2:45 Anesthetic: Wound #1 Right Gluteus: (In clinic) Topical Lidocaine 4% applied to wound bed Wound #3 Right Calcaneus: (In clinic) Topical Lidocaine 4% applied to wound bed Bathing/ Shower/ Hygiene: May shower and wash wound with soap and water. [name], [name] ([id number]) [id number] Page 9 of 12 Negative Presssure Wound Therapy: Wound #1 Right Gluteus: Wound Vac to wound continuously at 1103mm/hg pressure - Hold NPWT this week Black Foam Off-Loading: Heel suspension boot to: - globoped right foot to ambulate Low air-loss mattress (Group 2) - Adapt Turn and reposition every 2 hours - avoid lying on back, stand at least every hour while out of bed, float heels off bed with pillows under calves while in bed Prevalon Boot - right foot esp. while in bed. Additional Orders / Instructions: Follow  Nutritious Diet - increase protein to 70-80 gms per day, hold ozempic Juven Shake 1-2 times daily. Home Health: New wound care orders this week; continue Home Health for wound care. May utilize formulary equivalent dressing for wound treatment orders unless otherwise specified. Other Home Health Orders/Instructions: - [facility] ordered were: Anaerobic culture - PCR culture The following medication(s) was prescribed: amoxicillin-pot clavulanate oral 875 mg-125 mg tablet 1 tab p.o. twice daily x 10 days starting 01/24/2023 gentamicin topical 0.1 % ointment Apply to heel wound with dressing changes as directed starting 01/24/2023 mupirocin topical 2 % ointment Apply to heel wound with dressing changes as directed starting 01/24/2023 WOUND #1: - Gluteus Wound Laterality: Right Cleanser: Wound Cleanser 1 x Per Day/30 Days Discharge Instructions: Cleanse the wound with wound cleanser prior to applying a clean dressing using gauze sponges, not tissue or cotton balls. Peri-Wound Care: Nystop Powder (Nystatin) 1 x Per Day/30 Days Discharge Instructions: Apply Nystop (Nystatin) Powder mix with zinc oxide to irritated periwound skin Peri-Wound Care: Zinc Oxide Ointment 30g tube 1 x Per Day/30  Days Discharge Instructions: Apply Zinc Oxide to periwound with each dressing change Prim Dressing: Vashe 1 x Per Day/30 Days ary Discharge Instructions: moisten gauze with Vashe and pack into wound Secondary Dressing: ABD Pad, 8x10 1 x Per Day/30 Days Discharge Instructions: Apply over primary dressing as directed. Secured With: Paper T ape, 2x10 (in/yd) 1 x Per Day/30 Days Discharge Instructions: Secure dressing with tape as directed. WOUND #3: - Calcaneus Wound Laterality: Right Peri-Wound Care: Zinc Oxide Ointment 30g tube 1 x Per Day/30 Days Discharge Instructions: Apply Zinc Oxide to periwound with each dressing change Topical: Gentamicin 1 x Per Day/30 Days Discharge Instructions: mix 50:50  with mupirocin and rub into wound bed Topical: Mupirocin Ointment 1 x Per Day/30 Days Discharge Instructions: Apply Mupirocin (Bactroban) as instructed Prim Dressing: Maxorb Extra Ag+ Alginate Dressing, 4x4.75 (in/in) 1 x Per Day/30 Days ary Discharge Instructions: Apply to wound bed as instructed Secondary Dressing: ALLEVYN Heel 4 1/2in x 5 1/2in / 10.5cm x 13.5cm 1 x Per Day/30 Days Discharge Instructions: Apply over primary dressing as directed. Secondary Dressing: Woven Gauze Sponge, Non-Sterile 4x4 in 1 x Per Day/30 Days Discharge Instructions: Apply over primary dressing as directed. Secured With: [employer], 4.5x3.1 (in/yd) 1 x Per Day/30 Days Discharge Instructions: Secure with Kerlix as directed. 01/24/2023: The periwound of the gluteal ulcer is quite red and irritated. There are areas of superficial skin breakdown with slough accumulation. The tissue within the depths of the wound is more pale and the cavity is larger. The heel is macerated around the wound and bone is palpable. I used a curette to debride slough from the periwound breakdown at the gluteal ulcer. We will apply silver alginate to these areas and pack the gluteal ulcer with Vashe-moistened gauze. Will apply a mixture of nystatin and zinc oxide or Desitin to protect the periwound skin here. I debrided slough, tendon, and subcutaneous tissue from the heel ulcer. I then took a culture to see if there is an infectious etiology for the breakdown of this wound. I empirically prescribed Augmentin and will tailor antibiotic therapy appropriately once culture data return. Will apply topical gentamicin and mupirocin to the wound, along with silver alginate. I will see her back in 1 week. Electronic Signature(s) Signed: 01/24/2023 3:36:54 PM By: Duanne Guess MD FACS Previous Signature: 01/24/2023 3:35:58 PM Version By: Duanne Guess MD FACS Entered By: Duanne Guess on 01/24/2023  15:36:54 -------------------------------------------------------------------------------- HxROS Details Patient Name: Date of Service: Marisa Roux E. 01/24/2023 2:00 PM Medical Record Number: [id number] Patient Account Number: [id number] Date of Birth/Sex: Treating RN: 12/25/1950 (72 y.o. F) Primary Care Provider: Vincent Gros Other Clinician: Referring Provider: Treating Provider/Extender: Greer Pickerel in Treatment: 24 Information Obtained From Patient Caregiver Chart [name], [name] ([id number]) [id number] Page 10 of 12 Constitutional Symptoms (General Health) Medical History: Past Medical History Notes: morbid obesity Eyes Medical History: Negative for: Cataracts; Glaucoma; Optic Neuritis Ear/Nose/Mouth/Throat Medical History: Positive for: Chronic sinus problems/congestion - chronic rhinitis Negative for: Middle ear problems Respiratory Medical History: Positive for: Sleep Apnea - uses CPAP Cardiovascular Medical History: Positive for: Congestive Heart Failure Past Medical History Notes: hyperlipidemia Gastrointestinal Medical History: Past Medical History Notes: GERD, IBS, eosinophilic esophagitis, diverticulosis, Endocrine Medical History: Negative for: Type I Diabetes; Type II Diabetes Genitourinary Medical History: Negative for: End Stage Renal Disease Past Medical History Notes: CKD stage 3 Immunological Medical History: Positive for: Raynauds Negative for: Lupus Erythematosus; Scleroderma Past Medical History Notes: Sjoegren syndrome, fibromyalgia Integumentary (Skin) Medical  History: Negative for: History of Burn Musculoskeletal Medical History: Positive for: Rheumatoid Arthritis Negative for: Gout; Osteoarthritis; Osteomyelitis Neurologic Medical History: Positive for: Neuropathy Negative for: Dementia; Quadriplegia; Paraplegia; Seizure Disorder Oncologic Medical History: Negative  for: Received Chemotherapy; Received Radiation Psychiatric Medical History: Negative for: Lanney Gins Anxiety [name], [name] ([id number]) [id number] Page 11 of 12 HBO Extended History Items Ear/Nose/Mouth/Throat: Chronic sinus problems/congestion Immunizations Pneumococcal Vaccine: Received Pneumococcal Vaccination: Yes Received Pneumococcal Vaccination On or After 60th Birthday: Yes Implantable Devices None Hospitalization / Surgery History Type of Hospitalization/Surgery right femur fx ORIR right knee replacement left breast mass excision left ankle tibia repair abdominal hysterectomy adnoidectomy/tonsillectomy cholecystectomy dental implants patoid cystectomy tubal ligation Family and Social History Cancer: Yes - Father,Paternal Grandparents; Diabetes: No; Heart Disease: Yes - Mother,Father,Paternal Grandparents; Hereditary Spherocytosis: No; Hypertension: Yes - Mother; Kidney Disease: No; Lung Disease: No; Seizures: No; Stroke: Yes - Maternal Grandparents; Thyroid Problems: No; Tuberculosis: No; Never smoker; Marital Status - Widowed; Alcohol Use: Rarely; Drug Use: Prior History - TCH; Caffeine Use: Daily - soda, tea; Financial Concerns: No; Food, Clothing or Shelter Needs: No; Support System Lacking: No; Transportation Concerns: No Electronic Signature(s) Signed: 01/24/2023 4:16:31 PM By: Duanne Guess MD FACS Entered By: Duanne Guess on 01/24/2023 15:27:21 -------------------------------------------------------------------------------- SuperBill Details Patient Name: Date of Service: [name] 01/24/2023 Medical Record Number: [id number] Patient Account Number: [id number] Date of Birth/Sex: Treating RN: August 06, 1951 (72 y.o. F) Primary Care Provider: Vincent Gros Other Clinician: Referring Provider: Treating Provider/Extender: Greer Pickerel in Treatment: 19 Diagnosis Coding ICD-10  Codes Code Description L89.314 Pressure ulcer of right buttock, stage 4 L89.614 Pressure ulcer of right heel, stage 4 I73.00 Raynaud's syndrome without gangrene I50.32 Chronic diastolic (congestive) heart failure N18.32 Chronic kidney disease, stage 3b R73.02 Impaired glucose tolerance (oral) E66.9 Obesity, unspecified Facility Procedures : CPT4 Code: 10175102 Description: 11043 - DEB MUSC/FASCIA 20 SQ CM/< ICD-10 Diagnosis Description L89.614 Pressure ulcer of right heel, stage 4 Modifier: Quantity: 1 : [name], [name] Code: [id number] ([id number]) Description: 97597 - DEBRIDE WOUND 1ST 20 SQ CM OR < ICD-10 Diagnosis Description L89.314 Pressure ulcer of right buttock, stage 4 [id number] Modifier: [id number] Quantity: 1 f Page 12 of 12 Physician Procedures : CPT4 Code Description Modifier [id number] 99214 - WC PHYS LEVEL 4 - EST PT 25 ICD-10 Diagnosis Description L89.314 Pressure ulcer of right buttock, stage 4 L89.614 Pressure ulcer of right heel, stage 4 I50.32 Chronic diastolic (congestive) heart failure  N18.32 Chronic kidney disease, stage 3b Quantity: 1 : [id number] 11043 - WC PHYS DEBR MUSCLE/FASCIA 20 SQ CM ICD-10 Diagnosis Description L89.614 Pressure ulcer of right heel, stage 4 Quantity: 1 : [id number] 97597 - WC PHYS DEBR WO ANESTH 20 SQ CM ICD-10 Diagnosis Description L89.314 Pressure ulcer of right buttock, stage 4 Quantity: 1 Electronic Signature(s) Signed: 01/24/2023 3:37:20 PM By: Duanne Guess MD FACS Entered By: Duanne Guess on 01/24/2023 15:37:20

## 2023-01-26 DIAGNOSIS — G4733 Obstructive sleep apnea (adult) (pediatric): Secondary | ICD-10-CM | POA: Diagnosis not present

## 2023-01-26 DIAGNOSIS — R269 Unspecified abnormalities of gait and mobility: Secondary | ICD-10-CM | POA: Diagnosis not present

## 2023-01-30 ENCOUNTER — Other Ambulatory Visit: Payer: Self-pay | Admitting: Nurse Practitioner

## 2023-01-30 ENCOUNTER — Other Ambulatory Visit: Payer: Self-pay | Admitting: Physician Assistant

## 2023-01-30 ENCOUNTER — Encounter (HOSPITAL_BASED_OUTPATIENT_CLINIC_OR_DEPARTMENT_OTHER): Payer: PPO | Admitting: General Surgery

## 2023-01-30 DIAGNOSIS — S7291XD Unspecified fracture of right femur, subsequent encounter for closed fracture with routine healing: Secondary | ICD-10-CM

## 2023-01-30 DIAGNOSIS — L89614 Pressure ulcer of right heel, stage 4: Secondary | ICD-10-CM | POA: Diagnosis not present

## 2023-01-30 DIAGNOSIS — M79604 Pain in right leg: Secondary | ICD-10-CM

## 2023-01-30 DIAGNOSIS — L89314 Pressure ulcer of right buttock, stage 4: Secondary | ICD-10-CM | POA: Diagnosis not present

## 2023-01-30 NOTE — Progress Notes (Signed)
AINHARA, BURRI ([id number]) [id number] Page 1 of 10 Visit Report for 01/30/2023 Arrival Information Details Patient Name: Date of Service: QUEENESTER, WEISS 01/30/2023 2:45 PM Medical Record Number: [id number] Patient Account Number: [id number] Date of Birth/Sex: Treating RN: [birth year] (72 y.o. [name] Primary Care Aamna Mallozzi: Vincent Gros Other Clinician: Referring Sal Spratley: Treating Alynn Ellithorpe/Extender: Greer Pickerel in Treatment: 20 Visit Information History Since Last Visit Added or deleted any medications: No Patient Arrived: [name] Any new allergies or adverse reactions: No Arrival Time: 14:58 Had a fall or experienced change in No Accompanied By: self activities of daily living that may affect Transfer Assistance: None risk of falls: Patient Identification Verified: Yes Signs or symptoms of abuse/neglect since last visito No Secondary Verification Process Completed: Yes Hospitalized since last visit: No Patient Requires Transmission-Based Precautions: No Implantable device outside of the clinic excluding No Patient Has Alerts: No cellular tissue based products placed in the center since last visit: Has Dressing in Place as Prescribed: Yes Pain Present Now: Yes Electronic Signature(s) Signed: 01/30/2023 4:43:15 PM By: Zenaida Deed RN, BSN Entered By: Zenaida Deed on 01/30/2023 14:59:08 -------------------------------------------------------------------------------- Encounter Discharge Information Details Patient Name: Date of Service: [name] 01/30/2023 2:45 PM Medical Record Number: [id number] Patient Account Number: [id number] Date of Birth/Sex: Treating RN: 02-29-1951 (72 y.o. [name] Primary Care Danne Vasek: Vincent Gros Other Clinician: Referring Hafsa Lohn: Treating Erielle Gawronski/Extender: Greer Pickerel in Treatment: 20 Encounter Discharge Information Items  Post Procedure Vitals Discharge Condition: Stable Temperature (F): 98.3 Ambulatory Status: Walker Pulse (bpm): 79 Discharge Destination: Home Respiratory Rate (breaths/min): 18 Transportation: Private Auto Blood Pressure (mmHg): 123/53 Accompanied By: self Schedule Follow-up Appointment: Yes Clinical Summary of Care: Patient Declined Electronic Signature(s) Signed: 01/30/2023 4:43:15 PM By: Zenaida Deed RN, BSN Entered By: Zenaida Deed on 01/30/2023 16:27:03 [name] ([id number]) [id number] Page 2 of 10 -------------------------------------------------------------------------------- Lower Extremity Assessment Details Patient Name: Date of Service: CALEA, HEMPSTEAD 01/30/2023 2:45 PM Medical Record Number: [id number] Patient Account Number: [id number] Date of Birth/Sex: Treating RN: [birth year] (72 y.o. [name] Primary Care Jibreel Fedewa: Vincent Gros Other Clinician: Referring Miroslav Gin: Treating Leisl Spurrier/Extender: Greer Pickerel in Treatment: 20 Edema Assessment Assessed: [Left: No] [Right: No] Edema: [Left: Ye] [Right: s] Calf Left: Right: Point of Measurement: From Medial Instep 41 cm Ankle Left: Right: Point of Measurement: From Medial Instep 27 cm Vascular Assessment Pulses: Dorsalis Pedis Palpable: [Right:Yes] Electronic Signature(s) Signed: 01/30/2023 4:43:15 PM By: Zenaida Deed RN, BSN Entered By: Zenaida Deed on 01/30/2023 15:06:21 -------------------------------------------------------------------------------- Multi Wound Chart Details Patient Name: Date of Service: [name] 01/30/2023 2:45 PM Medical Record Number: [id number] Patient Account Number: [id number] Date of Birth/Sex: Treating RN: Aug 16, 1950 (72 y.o. F) Primary Care Akeira Lahm: Vincent Gros Other Clinician: Referring Maghan Jessee: Treating Chantalle Defilippo/Extender: Greer Pickerel in Treatment:  20 Vital Signs Height(in): 62 Pulse(bpm): 79 Weight(lbs): 207 Blood Pressure(mmHg): 123/53 Body Mass Index(BMI): 37.9 Temperature(F): 98.3 Respiratory Rate(breaths/min): 20 [1:Photos:] [N/A:N/A [id number] Page 3 of 10] Right Gluteus Right Calcaneus N/A Wound Location: Pressure Injury Pressure Injury N/A Wounding Event: Pressure Ulcer Pressure Ulcer N/A Primary Etiology: Chronic sinus problems/congestion, Chronic sinus problems/congestion, N/A Comorbid History: Sleep Apnea, Congestive Heart Sleep Apnea, Congestive Heart Failure, Raynauds, Rheumatoid Failure, Raynauds, Rheumatoid Arthritis, Neuropathy Arthritis, Neuropathy 08/11/2022 09/21/2022 N/A Date Acquired: 20 17 N/A Weeks of Treatment: Open Open N/A Wound Status: No No N/A Wound Recurrence: 3.5x4.5x4.2 2x3.9x1 N/A Measurements L x W x D (cm) 12.37 6.126  N/A A (cm) : rea 51.954 6.126 N/A Volume (cm) : 43.70% 36.60% N/A % Reduction in A rea: 60.60% -534.20% N/A % Reduction in Volume: 1 Starting Position 1 (o'clock): 2 Ending Position 1 (o'clock): 11 Maximum Distance 1 (cm): Yes No N/A Undermining: Category/Stage IV Category/Stage IV N/A Classification: Large Medium N/A Exudate A mount: Serosanguineous Purulent N/A Exudate Type: red, brown yellow, brown, green N/A Exudate Color: Well defined, not attached Distinct, outline attached N/A Wound Margin: Large (67-100%) Medium (34-66%) N/A Granulation A mount: Red Red, Pale, Hyper-granulation N/A Granulation Quality: None Present (0%) Medium (34-66%) N/A Necrotic A mount: Fat Layer (Subcutaneous Tissue): Yes Fat Layer (Subcutaneous Tissue): Yes N/A Exposed Structures: Fascia: No Tendon: Yes Tendon: No Bone: Yes Muscle: No Fascia: No Joint: No Muscle: No Bone: No Joint: No None None N/A Epithelialization: N/A Debridement - Excisional N/A Debridement: Pre-procedure Verification/Time Out N/A 15:25 N/A Taken: N/A  Lidocaine 4% Topical Solution N/A Pain Control: N/A Subcutaneous, Slough N/A Tissue Debrided: N/A Skin/Subcutaneous Tissue N/A Level: N/A 6.12 N/A Debridement A (sq cm): rea N/A Curette N/A Instrument: N/A Minimum N/A Bleeding: N/A Pressure N/A Hemostasis A chieved: N/A 0 N/A Procedural Pain: N/A 0 N/A Post Procedural Pain: N/A Procedure was tolerated well N/A Debridement Treatment Response: N/A 2x3.9x1 N/A Post Debridement Measurements L x W x D (cm) N/A 6.126 N/A Post Debridement Volume: (cm) N/A Category/Stage IV N/A Post Debridement Stage: Excoriation: No No Abnormalities Noted N/A Periwound Skin Texture: Induration: No Callus: No Crepitus: No Rash: No Scarring: No Maceration: No Maceration: No N/A Periwound Skin Moisture: Dry/Scaly: No Dry/Scaly: No Atrophie Blanche: No No Abnormalities Noted N/A Periwound Skin Color: Cyanosis: No Ecchymosis: No Erythema: No Hemosiderin Staining: No Mottled: No Pallor: No Rubor: No No Abnormality No Abnormality N/A Temperature: Yes Yes N/A Tenderness on Palpation: Negative Pressure Wound Therapy Debridement N/A Procedures Performed: Maintenance (NPWT) Treatment Notes Electronic Signature(s) Signed: 01/30/2023 3:39:28 PM By: Duanne Guess MD FACS Entered By: Duanne Guess on 01/30/2023 15:39:28 [name] ([id number]) [id number] Page 4 of 10 -------------------------------------------------------------------------------- Multi-Disciplinary Care Plan Details Patient Name: Date of Service: MELANYE, HILLIGOSS 01/30/2023 2:45 PM Medical Record Number: [id number] Patient Account Number: [id number] Date of Birth/Sex: Treating RN: 02-19-1951 (72 y.o. [name] Primary Care Marisabel Macpherson: Vincent Gros Other Clinician: Referring Alizae Bechtel: Treating Merrilyn Legler/Extender: Greer Pickerel in Treatment: 20 Multidisciplinary Care Plan reviewed with  physician Active Inactive Pressure Nursing Diagnoses: Knowledge deficit related to management of pressures ulcers Goals: Patient will remain free of pressure ulcers Date Initiated: 09/15/2022 Date Inactivated: 09/28/2022 Target Resolution Date: 11/09/2022 Unmet Reason: new pressure ulcer Goal Status: Unmet right heel Patient/caregiver will verbalize risk factors for pressure ulcer development Date Initiated: 09/15/2022 Target Resolution Date: 02/09/2023 Goal Status: Active Interventions: Assess potential for pressure ulcer upon admission and as needed Treatment Activities: Patient referred for pressure reduction/relief devices : 09/15/2022 Pressure reduction/relief device ordered : 09/15/2022 Notes: Wound/Skin Impairment Nursing Diagnoses: Impaired tissue integrity Knowledge deficit related to ulceration/compromised skin integrity Goals: Patient/caregiver will verbalize understanding of skin care regimen Date Initiated: 09/28/2022 Target Resolution Date: 02/09/2023 Goal Status: Active Ulcer/skin breakdown will have a volume reduction of 50% by week 8 Date Initiated: 09/28/2022 Date Inactivated: 11/01/2022 Target Resolution Date: 11/02/2022 Goal Status: Met Ulcer/skin breakdown will have a volume reduction of 80% by week 12 Date Initiated: 11/01/2022 Date Inactivated: 11/29/2022 Target Resolution Date: 11/29/2022 Unmet Reason: vac in place, still Goal Status: Unmet debriding heel Interventions: Assess patient/caregiver ability to obtain necessary supplies Assess patient/caregiver  ability to perform ulcer/skin care regimen upon admission and as needed Assess ulceration(s) every visit Provide education on ulcer and skin care Treatment Activities: Skin care regimen initiated : 09/28/2022 Topical wound management initiated : 09/28/2022 Notes: Electronic Signature(s) Signed: 01/30/2023 4:43:15 PM By: Zenaida Deed RN, BSN [place], Tawny Asal ([id number]) [id number]  Page 5 of 10 Entered By: Zenaida Deed on 01/30/2023 15:19:22 -------------------------------------------------------------------------------- Negative Pressure Wound Therapy Maintenance (NPWT) Details Patient Name: Date of Service: MOO, HOLZMANN 01/30/2023 2:45 PM Medical Record Number: [id number] Patient Account Number: [id number] Date of Birth/Sex: Treating RN: 1951-06-27 (72 y.o. [name] Primary Care Israa Caban: Vincent Gros Other Clinician: Referring Jencarlo Bonadonna: Treating Shia Delaine/Extender: Greer Pickerel in Treatment: 20 NPWT Maintenance Performed for: Wound #1 Right Gluteus Additional Injuries Covered: No Performed By: Zenaida Deed, RN Type: VAC System Coverage Size (sq cm): 15.75 Pressure Type: Constant Pressure Setting: 125 mmHG Drain Type: None Primary Contact: None Sponge/Dressing Type: Foam- Black Date Initiated: 10/16/2022 Dressing Removed: No Quantity of Sponges/Gauze Removed: removed at home Canister Changed: Yes Dressing Reapplied: Yes Quantity of Sponges/Gauze Inserted: 1 Respones T Treatment: o good Days On NPWT : 107 Post Procedure Diagnosis Same as Pre-procedure Electronic Signature(s) Signed: 01/30/2023 4:43:15 PM By: Zenaida Deed RN, BSN Entered By: Zenaida Deed on 01/30/2023 15:30:58 -------------------------------------------------------------------------------- Pain Assessment Details Patient Name: Date of Service: [name] 01/30/2023 2:45 PM Medical Record Number: [id number] Patient Account Number: [id number] Date of Birth/Sex: Treating RN: September 07, 1950 (72 y.o. [name] Primary Care Nicolaas Savo: Vincent Gros Other Clinician: Referring Richie Bonanno: Treating Shiori Adcox/Extender: Greer Pickerel in Treatment: 20 Active Problems Location of Pain Severity and Description of Pain Patient Has Paino Yes Site Locations Pain Location: BREONAH, RUGGLES ([id number])  [phone].pdf Page 6 of 10 Pain Location: Pain in Ulcers With Dressing Change: Yes Duration of the Pain. Constant / Intermittento Intermittent Rate the pain. Current Pain Level: 3 Worst Pain Level: 5 Least Pain Level: 0 Character of Pain Describe the Pain: Aching, Tender, Other: sore Pain Management and Medication Current Pain Management: Medication: Yes Is the Current Pain Management Adequate: Adequate How does your wound impact your activities of daily livingo Sleep: Yes Bathing: No Appetite: No Relationship With Others: No Bladder Continence: No Emotions: Yes Bowel Continence: No Work: No Toileting: No Drive: No Dressing: No Hobbies: No Electronic Signature(s) Signed: 01/30/2023 4:43:15 PM By: Zenaida Deed RN, BSN Entered By: Zenaida Deed on 01/30/2023 15:00:39 -------------------------------------------------------------------------------- Patient/Caregiver Education Details Patient Name: Date of Service: [name] 6/18/2024andnbsp2:45 PM Medical Record Number: [id number] Patient Account Number: [id number] Date of Birth/Gender: Treating RN: 10-13-1950 (72 y.o. [name] Primary Care Physician: Vincent Gros Other Clinician: Referring Physician: Treating Physician/Extender: Greer Pickerel in Treatment: 20 Education Assessment Education Provided To: Patient Education Topics Provided Pressure: Methods: Explain/Verbal Responses: Reinforcements needed, State content correctly Wound/Skin Impairment: Methods: Explain/Verbal Responses: Reinforcements needed, State content correctly Electronic Signature(s) Signed: 01/30/2023 4:43:15 PM By: Zenaida Deed RN, BSN Flor[place], Tawny Asal ([id number]) [id number] Page 7 of 10 Entered By: Zenaida Deed on 01/30/2023 15:20:14 -------------------------------------------------------------------------------- Wound Assessment  Details Patient Name: Date of Service: MALARI, PASKINS 01/30/2023 2:45 PM Medical Record Number: [id number] Patient Account Number: [id number] Date of Birth/Sex: Treating RN: 07-Apr-1951 (72 y.o. 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 Wound Open Wounding Event: Pressure  Injury Status: Date Acquired: 08/11/2022 Comorbid Chronic sinus problems/congestion, Sleep Apnea, Congestive Heart Weeks Of Treatment: 20 History: Failure, Raynauds, Rheumatoid Arthritis, Neuropathy Clustered Wound: No Photos Wound Measurements Length: (cm) 3.5 Width: (cm) 4.5 Depth: (cm) 4.2 Area: (cm) 12.37 Volume: (cm) 51.954 % Reduction in Area: 43.7% % Reduction in Volume: 60.6% Epithelialization: None Tunneling: No Undermining: Yes Starting Position (o'clock): 1 Ending Position (o'clock): 2 Maximum Distance: (cm) 11 Wound Description Classification: Category/Stage IV Wound Margin: Well defined, not attached Exudate Amount: Large Exudate Type: Serosanguineous Exudate Color: red, brown Foul Odor After Cleansing: No Slough/Fibrino Yes Wound Bed Granulation Amount: Large (67-100%) Exposed Structure Granulation Quality: Red Fascia Exposed: No Necrotic Amount: None Present (0%) Fat Layer (Subcutaneous Tissue) Exposed: Yes Tendon Exposed: No Muscle Exposed: No Joint Exposed: No Bone Exposed: No Periwound Skin Texture Texture Color No Abnormalities Noted: No No Abnormalities Noted: Yes Callus: No Temperature / Pain Crepitus: No Temperature: No Abnormality Excoriation: No Tenderness on PalpationLERLENE, PEACH ([id number]) [id number] Page 8 of 10 Induration: No Rash: No Scarring: No Moisture No Abnormalities Noted:  Yes Treatment Notes Wound #1 (Gluteus) Wound Laterality: Right Cleanser Wound Cleanser Discharge Instruction: Cleanse the wound with wound cleanser prior to applying a clean dressing using gauze sponges, not tissue or cotton balls. Peri-Wound Care Nystop Powder (Nystatin) Discharge Instruction: Apply Nystop (Nystatin) Powder as needed Topical Primary Dressing VAC Secondary Dressing Secured With Compression Wrap Compression Stockings Add-Ons Electronic Signature(s) Signed: 01/30/2023 4:43:15 PM By: Zenaida Deed RN, BSN Entered By: Zenaida Deed on 01/30/2023 15:18:10 -------------------------------------------------------------------------------- Wound Assessment Details Patient Name: Date of Service: [name] 01/30/2023 2:45 PM Medical Record Number: [id number] Patient Account Number: [id number] Date of Birth/Sex: Treating RN: June 22, 1951 (72 y.o. [name] Primary Care Ranata Laughery: Vincent Gros Other Clinician: Referring Amyiah Gaba: Treating Biagio Snelson/Extender: Greer Pickerel in Treatment: 20 Wound Status Wound Number: 3 Primary Pressure Ulcer Etiology: Wound Location: Right Calcaneus Wound Open Wounding Event: Pressure Injury Status: Date Acquired: 09/21/2022 Comorbid Chronic sinus problems/congestion, Sleep Apnea, Congestive Heart Weeks Of Treatment: 17 History: Failure, Raynauds, Rheumatoid Arthritis, Neuropathy Clustered Wound: No Photos WHISPER, PANASUK ([id number]) [id number] Page 9 of 10 Wound Measurements Length: (cm) 2 Width: (cm) 3.9 Depth: (cm) 1 Area: (cm) 6.126 Volume: (cm) 6.126 % Reduction in Area: 36.6% % Reduction in Volume: -534.2% Epithelialization: None Tunneling: No Undermining: No Wound Description Classification: Category/Stage IV Wound Margin: Distinct, outline attached Exudate Amount: Medium Exudate Type: Purulent Exudate Color: yellow, brown, green Foul Odor After  Cleansing: No Slough/Fibrino Yes Wound Bed Granulation Amount: Medium (34-66%) Exposed Structure Granulation Quality: Red, Pale, Hyper-granulation Fascia Exposed: No Necrotic Amount: Medium (34-66%) Fat Layer (Subcutaneous Tissue) Exposed: Yes Necrotic Quality: Adherent Slough Tendon Exposed: Yes Muscle Exposed: No Joint Exposed: No Bone Exposed: Yes Periwound Skin Texture Texture Color No Abnormalities Noted: Yes No Abnormalities Noted: Yes Moisture Temperature / Pain No Abnormalities Noted: No Temperature: No Abnormality Dry / Scaly: No Tenderness on Palpation: Yes Maceration: No Treatment Notes Wound #3 (Calcaneus) Wound Laterality: Right Cleanser Peri-Wound Care Zinc Oxide Ointment 30g tube Discharge Instruction: Apply Zinc Oxide to periwound with each dressing change Topical Gentamicin Discharge Instruction: mix 50:50 with mupirocin and rub into wound bed Mupirocin Ointment Discharge Instruction: Apply Mupirocin (Bactroban) as instructed Primary Dressing Maxorb Extra Ag+ Alginate Dressing, 4x4.75 (in/in) Discharge Instruction: Apply to wound bed as instructed Secondary Dressing ALLEVYN Heel 4 1/2in x 5 1/2in / 10.5cm x 13.5cm Discharge Instruction: Apply over primary dressing as directed. Woven Gauze Sponge,  Non-Sterile 4x4 in Discharge Instruction: Apply over primary dressing as directed. Secured With [employer], 4.5x3.1 (in/yd) Discharge Instruction: Secure with Kerlix as directed. Compression Wrap Compression Stockings Add-Ons Electronic Signature(s) Signed: 01/30/2023 4:43:15 PM By: Zenaida Deed RN, BSN Entered By: Zenaida Deed on 01/30/2023 15:19:08 EZABELLA, HENDRIKS ([id number]) [id number] Page 10 of 10 -------------------------------------------------------------------------------- Vitals Details Patient Name: Date of Service: WILLIAMS, PIZZI 01/30/2023 2:45 PM Medical Record Number: [id number] Patient Account  Number: [id number] Date of Birth/Sex: Treating RN: Jan 15, 1951 (72 y.o. [name] Primary Care Arel Tippen: Vincent Gros Other Clinician: Referring Damisha Wolff: Treating Haizlee Henton/Extender: Greer Pickerel in Treatment: 20 Vital Signs Time Taken: 14:58 Temperature (F): 98.3 Height (in): 62 Pulse (bpm): 79 Weight (lbs): 207 Respiratory Rate (breaths/min): 20 Body Mass Index (BMI): 37.9 Blood Pressure (mmHg): 123/53 Reference Range: 80 - 120 mg / dl Electronic Signature(s) Signed: 01/30/2023 4:43:15 PM By: Zenaida Deed RN, BSN Entered By: Zenaida Deed on 01/30/2023 14:59:54

## 2023-01-31 NOTE — Progress Notes (Signed)
TYEASE, [place] ([id number]) [id number] Page 1 of 11 Visit Report for 01/30/2023 Chief Complaint Document Details Patient Name: Date of Service: [name], [name] 01/30/2023 2:45 PM Medical Record Number: [id number] Patient Account Number: [id number] Date of Birth/Sex: Treating RN: Jan 01, 1951 (72 y.o. F) Primary Care Provider: Vincent Cooper Other Clinician: Referring Provider: Treating Provider/Extender: [name] in Treatment: 20 Information Obtained from: Patient Chief Complaint Patient is at the clinic for treatment of open pressure ulcers Electronic Signature(s) Signed: 01/30/2023 3:39:57 PM By: Duanne Guess MD FACS Entered By: Duanne Guess on 01/30/2023 15:39:57 -------------------------------------------------------------------------------- Debridement Details Patient Name: Date of Service: [name] 01/30/2023 2:45 PM Medical Record Number: [id number] Patient Account Number: [id number] Date of Birth/Sex: Treating RN: [birth year] (72 y.o. [name] Primary Care Provider: Vincent Cooper Other Clinician: Referring Provider: Treating Provider/Extender: [name] in Treatment: 20 Debridement Performed for Assessment: Wound #3 Right Calcaneus Performed By: Physician Duanne Guess, MD Debridement Type: Debridement Level of Consciousness (Pre-procedure): Awake and Alert Pre-procedure Verification/Time Out Yes - 15:25 Taken: Start Time: 15:27 Pain Control: Lidocaine 4% T opical Solution Percent of Wound Bed Debrided: 100% T Area Debrided (cm): otal 6.12 Tissue and other material debrided: Viable, Non-Viable, Slough, Subcutaneous, Slough Level: Skin/Subcutaneous Tissue Debridement Description: Excisional Instrument: Curette Bleeding: Minimum Hemostasis Achieved: Pressure Procedural Pain: 0 Post Procedural Pain: 0 Response to Treatment: Procedure was tolerated  well Level of Consciousness (Post- Awake and Alert procedure): Post Debridement Measurements of Total Wound Length: (cm) 2 Stage: Category/Stage IV Width: (cm) 3.9 Depth: (cm) 1 Volume: (cm) 6.126 Character of Wound/Ulcer Post Debridement: Improved Post Procedure Diagnosis [name], [name] ([id number]) [id number] Page 2 of 11 Same as Pre-procedure Notes Scribed for Dr. Lady Gary by Zenaida Deed, RN Electronic Signature(s) Signed: 01/30/2023 3:57:36 PM By: Duanne Guess MD FACS Signed: 01/30/2023 4:43:15 PM By: Zenaida Deed RN, BSN Entered By: Zenaida Deed on 01/30/2023 15:29:24 -------------------------------------------------------------------------------- HPI Details Patient Name: Date of Service: [name] 01/30/2023 2:45 PM Medical Record Number: [id number] Patient Account Number: [id number] Date of Birth/Sex: Treating RN: 03/17/1951 (72 y.o. F) Primary Care Provider: Vincent Cooper Other Clinician: Referring Provider: Treating Provider/Extender: [name] in Treatment: 20 History of Present Illness HPI Description: ADMISSION 09/07/2023 This is a 72 year old woman with a past medical history notable for obesity, congestive heart failure, Raynaud's syndrome, CKD stage IIIb, osteoporosis, and rheumatoid arthritis. In November 2023, she suffered a fall that resulted in a femur fracture. She was hospitalized for about a week and underwent surgical repair of the fracture. She subsequently developed pressure ulcers on her right buttocks and ischium. She has been receiving home health services and they have been applying Medihoney. They have been reporting the wounds as stage II, but on evaluation, the large ulcer was probably unstageable at the time they were evaluating it but it is clearly a stage IV at this point. The smaller ulcer does have fat layer exposure and therefore is a stage III. The patient  is accompanied by her daughter. She says she has been sleeping on a regular bed but recently ordered an air mattress T opper, but does not have it yet. She is on Ozempic for weight loss and therefore has a poor appetite and struggles to get adequate protein intake. 09/15/2022: The large stage IV ulcer is substantially cleaner this week. The stage III ulcer is a little smaller. Both have slough accumulation. The culture that I took last week was polymicrobial. The Augmentin  that I prescribed was adequate coverage for the species and she continues to take this. We have also ordered Keystone topical antibiotic compound, but this has not yet arrived. 09/21/2022: The stage IV ulcer has some necrotic muscle at the base but is otherwise fairly clean. The stage III ulcer is smaller again with light slough on the surface. She has her Keystone topical antibiotic compound with her today. 09/29/2022: There is still some necrotic muscle at the base of the stage IV ulcer that I was unable to get to last week. The stage III ulcer has healed. She unfortunately has developed a new pressure ulcer on her right heel. 10/06/2022: Still with some devitalized muscle at the base of the stage IV ulcer, but otherwise this wound is looking quite clean. The pressure induced tissue injury on her heel is demarcating and much of the area appears to be epithelialized, but there is still 2 areas that remain questionable. 10/12/2022: The stage IV ulcer is very clean and ready for wound VAC, which will be delivered tomorrow according to the patient. The tissue injury on her heel is drying up and does not feel particularly boggy this week. 10/19/2022: The heel injury continues to improve. There is still some dry eschar present on the more plantar aspect of it. There is some slough accumulation on the surface of the stage IV pressure ulcer. The wound VAC was initiated by home health last week. 11/01/2022: The gluteal pressure ulcer is very clean  without any necrotic tissue or slough. The depth has come in by over half a centimeter. The deep tissue injury on her heel continues to improve. There is still dry eschar that we are painting with Betadine as well as some fresh-looking viable tissue at the margin. 11/15/2022: The gluteal pressure ulcer is clean and contracting. The eschar on her heel ulcer is beginning to separate from the underlying tissue. 11/29/2022: The gluteal pressure ulcer is very clean. The depth has come in by about a centimeter. The eschar has completely separated off of her heel. There is some fibrinous exudate and slough on the heel. It has declared itself as a stage III at this point. 12/13/2022: The heel ulcer is smaller and much cleaner, but still has some non-viable tissue present. The orifice of the gluteal ulcer is contracting, but the depth remains the same. Fortunately, the sponge for the [facility] is being packed appropriately into the full depth of the wound. 12/27/2022: The heel ulcer is smaller, but I think the depth and degree of tissue injury has finally declared itself, with tendon being exposed at the posterior aspect of the ulcer. It is now stage IV. The gluteal ulcer continues to contract circumferentially, but the depth is still unchanged. 01/10/2023: The heel ulcer has contracted further. There is still rubbery slough on the surface. There has been no further tissue breakdown; I think the last of the nonviable tissue was removed at her visit 2 weeks ago. The gluteal ulcer has contracted further circumferentially, but there is still a tunnel that ankles off cranially that remains about the same depth. The tissues appear viable at both sites. 01/24/2023: Unfortunately, the home health nurse that was applying the wound VAC did not put any drape on her skin for the bridge and was applying sponge directly to the patient's skin. This is resulted in significant tissue breakdown and pain for the patient. Once we were notified  of this, we discontinued the wound VAC and they have been packing the wound with saline  moistened gauze. The heel has also deteriorated. Bone is now exposed. 01/30/2023: Her skin looks significantly better this week. It has recovered from the insult caused by applying the sponge directly to it. Her heel looks a little bit better as well. The culture that I took last week grew out group A strep, Proteus mirabilis, and Enterococcus, along with other skin flora. Although Levaquin was the recommended antibiotic, she is on amiodarone and therefore Levaquin is contraindicated. She has been taking Augmentin and is showing signs of RAEVYN, FENZEL ([id number]) [id number] Page 3 of 11 improvement. Electronic Signature(s) Signed: 01/30/2023 3:41:12 PM By: Duanne Guess MD FACS Entered By: Duanne Guess on 01/30/2023 15:41:12 -------------------------------------------------------------------------------- Physical Exam Details Patient Name: Date of Service: [name] 01/30/2023 2:45 PM Medical Record Number: [id number] Patient Account Number: [id number] Date of Birth/Sex: Treating RN: 1951-02-01 (72 y.o. F) Primary Care Provider: Vincent Cooper Other Clinician: Referring Provider: Treating Provider/Extender: [name] in Treatment: 20 Constitutional . . . . no acute distress. Respiratory Normal work of breathing on room air. Notes 01/30/2023: The periwound of the gluteal ulcer is still a little red, but significantly improved from last week. The tissue within the depths of the wound is also a healthier beefy red and the cavity has returned to its preinsult size. The tissue around the heel also looks better. Bone is still exposed and there is some slough on the surface. Electronic Signature(s) Signed: 01/30/2023 3:42:41 PM By: Duanne Guess MD FACS Entered By: Duanne Guess on 01/30/2023  15:42:41 -------------------------------------------------------------------------------- Physician Orders Details Patient Name: Date of Service: [name] 01/30/2023 2:45 PM Medical Record Number: [id number] Patient Account Number: [id number] Date of Birth/Sex: Treating RN: [birth year] (72 y.o. [name] Primary Care Provider: Vincent Cooper Other Clinician: Referring Provider: Treating Provider/Extender: [name] in Treatment: 20 Verbal / Phone Orders: No Diagnosis Coding ICD-10 Coding Code Description L89.314 Pressure ulcer of right buttock, stage 4 L89.614 Pressure ulcer of right heel, stage 4 I73.00 Raynaud's syndrome without gangrene I50.32 Chronic diastolic (congestive) heart failure N18.32 Chronic kidney disease, stage 3b R73.02 Impaired glucose tolerance (oral) E66.9 Obesity, unspecified Follow-up Appointments ppointment in 1 week. - Dr. Lady Gary RM 1 Return A Wed 6/26 @ 2:00 pm [name], [name] ([id number]) [id number] Page 4 of 11 Anesthetic Wound #1 Right Gluteus (In clinic) Topical Lidocaine 4% applied to wound bed Wound #3 Right Calcaneus (In clinic) Topical Lidocaine 4% applied to wound bed Bathing/ Shower/ Hygiene May shower and wash wound with soap and water. Negative Presssure Wound Therapy Wound #1 Right Gluteus Wound Vac to wound continuously at 12mm/hg pressure - resume changes 3 times per week Black Foam Off-Loading Heel suspension boot to: - globoped right foot to ambulate Low air-loss mattress (Group 2) - Adapt Turn and reposition every 2 hours - avoid lying on back, stand at least every hour while out of bed, float heels off bed with pillows under calves while in bed Prevalon Boot - right foot esp. while in bed. Additional Orders / Instructions Follow Nutritious Diet - increase protein to 70-80 gms per day, hold ozempic Juven Shake 1-2 times daily. Home Health New wound care  orders this week; continue Home Health for wound care. May utilize formulary equivalent dressing for wound treatment orders unless otherwise specified. - resume VAC, bridge up toward back Other Home Health Orders/Instructions: - [facility] Wound Treatment Wound #1 - Gluteus Wound Laterality: Right Cleanser: Wound Cleanser 3 x Per Week/30 Days Discharge  Instructions: Cleanse the wound with wound cleanser prior to applying a clean dressing using gauze sponges, not tissue or cotton balls. Peri-Wound Care: Nystop Powder (Nystatin) 3 x Per Week/30 Days Discharge Instructions: Apply Nystop (Nystatin) Powder as needed Prim Dressing: VAC ary 3 x Per Week/30 Days Wound #3 - Calcaneus Wound Laterality: Right Peri-Wound Care: Zinc Oxide Ointment 30g tube 1 x Per Day/30 Days Discharge Instructions: Apply Zinc Oxide to periwound with each dressing change Topical: Gentamicin 1 x Per Day/30 Days Discharge Instructions: mix 50:50 with mupirocin and rub into wound bed Topical: Mupirocin Ointment 1 x Per Day/30 Days Discharge Instructions: Apply Mupirocin (Bactroban) as instructed Prim Dressing: Maxorb Extra Ag+ Alginate Dressing, 4x4.75 (in/in) 1 x Per Day/30 Days ary Discharge Instructions: Apply to wound bed as instructed Secondary Dressing: ALLEVYN Heel 4 1/2in x 5 1/2in / 10.5cm x 13.5cm 1 x Per Day/30 Days Discharge Instructions: Apply over primary dressing as directed. Secondary Dressing: Woven Gauze Sponge, Non-Sterile 4x4 in 1 x Per Day/30 Days Discharge Instructions: Apply over primary dressing as directed. Secured With: [employer], 4.5x3.1 (in/yd) 1 x Per Day/30 Days Discharge Instructions: Secure with Kerlix as directed. Electronic Signature(s) Signed: 01/30/2023 3:57:36 PM By: Duanne Guess MD FACS Entered By: Duanne Guess on 01/30/2023 15:43:07 [name] ([id number]) [id number] Page 5 of  11 -------------------------------------------------------------------------------- Problem List Details Patient Name: Date of Service: [name], [name] 01/30/2023 2:45 PM Medical Record Number: [id number] Patient Account Number: [id number] Date of Birth/Sex: Treating RN: 26-Nov-1950 (72 y.o. [name] Primary Care Provider: Vincent Cooper Other Clinician: Referring Provider: Treating Provider/Extender: [name] in Treatment: 20 Active Problems ICD-10 Encounter Code Description Active Date MDM Diagnosis L89.314 Pressure ulcer of right buttock, stage 4 09/07/2022 No Yes L89.614 Pressure ulcer of right heel, stage 4 09/28/2022 No Yes I73.00 Raynaud's syndrome without gangrene 09/07/2022 No Yes I50.32 Chronic diastolic (congestive) heart failure 09/07/2022 No Yes N18.32 Chronic kidney disease, stage 3b 09/07/2022 No Yes R73.02 Impaired glucose tolerance (oral) 09/07/2022 No Yes E66.9 Obesity, unspecified 09/07/2022 No Yes Inactive Problems ICD-10 Code Description Active Date Inactive Date L89.313 Pressure ulcer of right buttock, stage 3 09/07/2022 09/07/2022 Resolved Problems Electronic Signature(s) Signed: 01/30/2023 3:39:19 PM By: Duanne Guess MD FACS Entered By: Duanne Guess on 01/30/2023 15:39:19 -------------------------------------------------------------------------------- Progress Note Details Patient Name: Date of Service: [name] 01/30/2023 2:45 PM Medical Record Number: [id number] Patient Account Number: [id number] Date of Birth/Sex: Treating RN: 10/04/1950 (72 y.o. F) Primary Care Provider: Vincent Cooper Other Clinician: Referring Provider: Treating Provider/Extender: [name] in Treatment: [address] ([id number]) [id number] Page 6 of 11 Subjective Chief Complaint Information obtained from Patient Patient is at the clinic for treatment of open pressure  ulcers History of Present Illness (HPI) ADMISSION 09/07/2023 This is a 72 year old woman with a past medical history notable for obesity, congestive heart failure, Raynaud's syndrome, CKD stage IIIb, osteoporosis, and rheumatoid arthritis. In November 2023, she suffered a fall that resulted in a femur fracture. She was hospitalized for about a week and underwent surgical repair of the fracture. She subsequently developed pressure ulcers on her right buttocks and ischium. She has been receiving home health services and they have been applying Medihoney. They have been reporting the wounds as stage II, but on evaluation, the large ulcer was probably unstageable at the time they were evaluating it but it is clearly a stage IV at this point. The smaller ulcer does have fat layer exposure and  therefore is a stage III. The patient is accompanied by her daughter. She says she has been sleeping on a regular bed but recently ordered an air mattress T opper, but does not have it yet. She is on Ozempic for weight loss and therefore has a poor appetite and struggles to get adequate protein intake. 09/15/2022: The large stage IV ulcer is substantially cleaner this week. The stage III ulcer is a little smaller. Both have slough accumulation. The culture that I took last week was polymicrobial. The Augmentin that I prescribed was adequate coverage for the species and she continues to take this. We have also ordered Keystone topical antibiotic compound, but this has not yet arrived. 09/21/2022: The stage IV ulcer has some necrotic muscle at the base but is otherwise fairly clean. The stage III ulcer is smaller again with light slough on the surface. She has her Keystone topical antibiotic compound with her today. 09/29/2022: There is still some necrotic muscle at the base of the stage IV ulcer that I was unable to get to last week. The stage III ulcer has healed. She unfortunately has developed a new pressure ulcer on  her right heel. 10/06/2022: Still with some devitalized muscle at the base of the stage IV ulcer, but otherwise this wound is looking quite clean. The pressure induced tissue injury on her heel is demarcating and much of the area appears to be epithelialized, but there is still 2 areas that remain questionable. 10/12/2022: The stage IV ulcer is very clean and ready for wound VAC, which will be delivered tomorrow according to the patient. The tissue injury on her heel is drying up and does not feel particularly boggy this week. 10/19/2022: The heel injury continues to improve. There is still some dry eschar present on the more plantar aspect of it. There is some slough accumulation on the surface of the stage IV pressure ulcer. The wound VAC was initiated by home health last week. 11/01/2022: The gluteal pressure ulcer is very clean without any necrotic tissue or slough. The depth has come in by over half a centimeter. The deep tissue injury on her heel continues to improve. There is still dry eschar that we are painting with Betadine as well as some fresh-looking viable tissue at the margin. 11/15/2022: The gluteal pressure ulcer is clean and contracting. The eschar on her heel ulcer is beginning to separate from the underlying tissue. 11/29/2022: The gluteal pressure ulcer is very clean. The depth has come in by about a centimeter. The eschar has completely separated off of her heel. There is some fibrinous exudate and slough on the heel. It has declared itself as a stage III at this point. 12/13/2022: The heel ulcer is smaller and much cleaner, but still has some non-viable tissue present. The orifice of the gluteal ulcer is contracting, but the depth remains the same. Fortunately, the sponge for the [facility] is being packed appropriately into the full depth of the wound. 12/27/2022: The heel ulcer is smaller, but I think the depth and degree of tissue injury has finally declared itself, with tendon being exposed at  the posterior aspect of the ulcer. It is now stage IV. The gluteal ulcer continues to contract circumferentially, but the depth is still unchanged. 01/10/2023: The heel ulcer has contracted further. There is still rubbery slough on the surface. There has been no further tissue breakdown; I think the last of the nonviable tissue was removed at her visit 2 weeks ago. The gluteal ulcer has  contracted further circumferentially, but there is still a tunnel that ankles off cranially that remains about the same depth. The tissues appear viable at both sites. 01/24/2023: Unfortunately, the home health nurse that was applying the wound VAC did not put any drape on her skin for the bridge and was applying sponge directly to the patient's skin. This is resulted in significant tissue breakdown and pain for the patient. Once we were notified of this, we discontinued the wound VAC and they have been packing the wound with saline moistened gauze. The heel has also deteriorated. Bone is now exposed. 01/30/2023: Her skin looks significantly better this week. It has recovered from the insult caused by applying the sponge directly to it. Her heel looks a little bit better as well. The culture that I took last week grew out group A strep, Proteus mirabilis, and Enterococcus, along with other skin flora. Although Levaquin was the recommended antibiotic, she is on amiodarone and therefore Levaquin is contraindicated. She has been taking Augmentin and is showing signs of improvement. Patient History Information obtained from Patient, Caregiver, Chart. Family History Cancer - Father,Paternal Grandparents, Heart Disease - Mother,Father,Paternal Grandparents, Hypertension - Mother, Stroke - Maternal Grandparents, No family history of Diabetes, Hereditary Spherocytosis, Kidney Disease, Lung Disease, Seizures, Thyroid Problems, Tuberculosis. Social History Never smoker, Marital Status - Widowed, Alcohol Use - Rarely, Drug Use -  Prior History - TCH, Caffeine Use - Daily - soda, tea. Medical History Eyes Denies history of Cataracts, Glaucoma, Optic Neuritis Ear/Nose/Mouth/Throat Patient has history of Chronic sinus problems/congestion - chronic rhinitis Denies history of Middle ear problems Respiratory Patient has history of Sleep Apnea - uses CPAP Cardiovascular Patient has history of Congestive Heart Failure Endocrine Denies history of Type I Diabetes, Type II Diabetes Genitourinary Denies history of End Stage Renal Disease Immunological Patient has history of Raynauds NALANIE, SLOMA ([id number]) [id number] Page 7 of 11 Denies history of Lupus Erythematosus, Scleroderma Integumentary (Skin) Denies history of History of Burn Musculoskeletal Patient has history of Rheumatoid Arthritis Denies history of Gout, Osteoarthritis, Osteomyelitis Neurologic Patient has history of Neuropathy Denies history of Dementia, Quadriplegia, Paraplegia, Seizure Disorder Oncologic Denies history of Received Chemotherapy, Received Radiation Psychiatric Denies history of Anorexia/bulimia, Confinement Anxiety Hospitalization/Surgery History - right femur fx ORIR. - right knee replacement. - left breast mass excision. - left ankle tibia repair. - abdominal hysterectomy. - adnoidectomy/tonsillectomy. - cholecystectomy. - dental implants. - patoid cystectomy. - tubal ligation. Medical A Surgical History Notes nd Constitutional Symptoms (General Health) morbid obesity Cardiovascular hyperlipidemia Gastrointestinal GERD, IBS, eosinophilic esophagitis, diverticulosis, Genitourinary CKD stage 3 Immunological Sjoegren syndrome, fibromyalgia Objective Constitutional no acute distress. Vitals Time Taken: 2:58 PM, Height: 62 in, Weight: 207 lbs, BMI: 37.9, Temperature: 98.3 F, Pulse: 79 bpm, Respiratory Rate: 20 breaths/min, Blood Pressure: 123/53 mmHg. Respiratory Normal work of breathing on room  air. General Notes: 01/30/2023: The periwound of the gluteal ulcer is still a little red, but significantly improved from last week. The tissue within the depths of the wound is also a healthier beefy red and the cavity has returned to its preinsult size. The tissue around the heel also looks better. Bone is still exposed and there is some slough on the surface. Integumentary (Hair, Skin) Wound #1 status is Open. Original cause of wound was Pressure Injury. The date acquired was: 08/11/2022. The wound has been in treatment 20 weeks. The wound is located on the Right Gluteus. The wound measures 3.5cm length x 4.5cm width x 4.2cm depth; 12.37cm^2 area and  51.954cm^3 volume. There is Fat Layer (Subcutaneous Tissue) exposed. There is no tunneling noted, however, there is undermining starting at 1:00 and ending at 2:00 with a maximum distance of 11cm. There is a large amount of serosanguineous drainage noted. The wound margin is well defined and not attached to the wound base. There is large (67- 100%) red granulation within the wound bed. There is no necrotic tissue within the wound bed. The periwound skin appearance had no abnormalities noted for moisture. The periwound skin appearance had no abnormalities noted for color. The periwound skin appearance did not exhibit: Callus, Crepitus, Excoriation, Induration, Rash, Scarring. Periwound temperature was noted as No Abnormality. The periwound has tenderness on palpation. Wound #3 status is Open. Original cause of wound was Pressure Injury. The date acquired was: 09/21/2022. The wound has been in treatment 17 weeks. The wound is located on the Right Calcaneus. The wound measures 2cm length x 3.9cm width x 1cm depth; 6.126cm^2 area and 6.126cm^3 volume. There is bone, tendon, and Fat Layer (Subcutaneous Tissue) exposed. There is no tunneling or undermining noted. There is a medium amount of purulent drainage noted. The wound margin is distinct with the outline  attached to the wound base. There is medium (34-66%) red, pale, hyper - granulation within the wound bed. There is a medium (34-66%) amount of necrotic tissue within the wound bed including Adherent Slough. The periwound skin appearance had no abnormalities noted for texture. The periwound skin appearance had no abnormalities noted for color. The periwound skin appearance did not exhibit: Dry/Scaly, Maceration. Periwound temperature was noted as No Abnormality. The periwound has tenderness on palpation. 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 There was a Excisional Skin/Subcutaneous Tissue Debridement with a total area of 6.12 sq cm performed by Duanne Guess, MD. With the following instrument(s): Curette to remove Viable and Non-Viable tissue/material. Material removed includes Subcutaneous Tissue and Slough and after achieving pain control using Lidocaine 4% T opical Solution. No specimens were taken. A time out was conducted at 15:25, prior to the start of the procedure. A Minimum amount of bleeding was controlled with Pressure. The procedure was tolerated well with a pain level of 0 throughout and a pain level of 0 following the procedure. Post Debridement Measurements: 2cm length x 3.9cm width x 1cm depth; 6.126cm^3 volume. Post debridement Stage noted as Category/Stage IV. Character of Wound/Ulcer Post Debridement is improved. Post procedure Diagnosis Wound #3: Same as Pre-Procedure General Notes: Scribed for Dr. Lady Gary by Zenaida Deed, RN. Plan Follow-up  Appointments: Return Appointment in 1 week. - Dr. Lady Gary RM 1 Wed 6/26 @ 2:00 pm Anesthetic: Wound #1 Right Gluteus: (In clinic) Topical Lidocaine 4% applied to wound bed Wound #3 Right Calcaneus: (In clinic) Topical Lidocaine 4% applied to wound bed Bathing/ Shower/ Hygiene: May shower and wash wound with soap and water. Negative Presssure Wound Therapy: Wound #1 Right Gluteus: Wound Vac to wound continuously at 166mm/hg pressure - resume changes 3 times per week Black Foam Off-Loading: Heel suspension boot to: - globoped right foot to ambulate Low air-loss mattress (Group 2) - Adapt Turn and reposition every 2 hours - avoid lying on back, stand at  least every hour while out of bed, float heels off bed with pillows under calves while in bed Prevalon Boot - right foot esp. while in bed. Additional Orders / Instructions: Follow Nutritious Diet - increase protein to 70-80 gms per day, hold ozempic Juven Shake 1-2 times daily. Home Health: New wound care orders this week; continue Home Health for wound care. May utilize formulary equivalent dressing for wound treatment orders unless otherwise specified. - resume VAC, bridge up toward back Other Home Health Orders/Instructions: - [facility] WOUND #1: - Gluteus Wound Laterality: Right Cleanser: Wound Cleanser 3 x Per Week/30 Days Discharge Instructions: Cleanse the wound with wound cleanser prior to applying a clean dressing using gauze sponges, not tissue or cotton balls. Peri-Wound Care: Nystop Powder (Nystatin) 3 x Per Week/30 Days Discharge Instructions: Apply Nystop (Nystatin) Powder as needed Prim Dressing: VAC 3 x Per Week/30 Days ary WOUND #3: - Calcaneus Wound Laterality: Right Peri-Wound Care: Zinc Oxide Ointment 30g tube 1 x Per Day/30 Days Discharge Instructions: Apply Zinc Oxide to periwound with each dressing change Topical: Gentamicin 1 x Per Day/30 Days Discharge Instructions: mix 50:50 with mupirocin and rub into wound  bed Topical: Mupirocin Ointment 1 x Per Day/30 Days Discharge Instructions: Apply Mupirocin (Bactroban) as instructed Prim Dressing: Maxorb Extra Ag+ Alginate Dressing, 4x4.75 (in/in) 1 x Per Day/30 Days ary Discharge Instructions: Apply to wound bed as instructed Secondary Dressing: ALLEVYN Heel 4 1/2in x 5 1/2in / 10.5cm x 13.5cm 1 x Per Day/30 Days Discharge Instructions: Apply over primary dressing as directed. Secondary Dressing: Woven Gauze Sponge, Non-Sterile 4x4 in 1 x Per Day/30 Days Discharge Instructions: Apply over primary dressing as directed. Secured With: [employer], 4.5x3.1 (in/yd) 1 x Per Day/30 Days Discharge Instructions: Secure with Kerlix as directed. 01/30/2023: The periwound of the gluteal ulcer is still a little red, but significantly improved from last week. The tissue within the depths of the wound is also a healthier beefy red and the cavity has returned to its preinsult size. The tissue around the heel also looks better. Bone is still exposed and there is some slough on the surface. The gluteal ulcer did not require debridement. I think the skin has improved to the point that we can reinstitute the wound VAC. I used a curette to debride slough and subcutaneous tissue from the heel wound. We will continue the mixture of topical gentamicin and mupirocin with silver alginate on the heel. She will continue the oral Augmentin that was prescribed. She will follow-up in 1 week. Electronic Signature(s) Signed: 01/30/2023 3:45:09 PM By: Duanne Guess MD FACS Entered By: Duanne Guess on 01/30/2023 15:45:09 [name] ([id number]) [id number] Page 9 of 11 -------------------------------------------------------------------------------- HxROS Details Patient Name: Date of Service: [name], [name] 01/30/2023 2:45 PM Medical Record Number: [id number] Patient Account Number: [id number] Date of Birth/Sex: Treating RN: 1950-09-08 (72  y.o. F) Primary Care Provider: Vincent Cooper Other Clinician: Referring Provider: Treating Provider/Extender: [name] in Treatment: 20 Information Obtained From Patient Caregiver Chart Constitutional Symptoms (General Health) Medical History: Past Medical History Notes: morbid obesity Eyes Medical History: Negative for: Cataracts; Glaucoma; Optic Neuritis Ear/Nose/Mouth/Throat Medical History: Positive for: Chronic sinus problems/congestion - chronic rhinitis Negative for: Middle ear problems Respiratory Medical History: Positive for: Sleep Apnea - uses CPAP Cardiovascular Medical History: Positive for: Congestive Heart Failure Past Medical History Notes: hyperlipidemia Gastrointestinal Medical History: Past Medical History Notes: GERD, IBS, eosinophilic esophagitis, diverticulosis, Endocrine Medical History: Negative for: Type I Diabetes; Type  II Diabetes Genitourinary Medical History: Negative for: End Stage Renal Disease Past Medical History Notes: CKD stage 3 Immunological Medical History: Positive for: Raynauds Negative for: Lupus Erythematosus; Scleroderma Past Medical History Notes: Sjoegren syndrome, fibromyalgia Integumentary (Skin) Medical History: Negative for: History of Burn [name], [name] ([id number]) [id number] Page 10 of 11 Musculoskeletal Medical History: Positive for: Rheumatoid Arthritis Negative for: Gout; Osteoarthritis; Osteomyelitis Neurologic Medical History: Positive for: Neuropathy Negative for: Dementia; Quadriplegia; Paraplegia; Seizure Disorder Oncologic Medical History: Negative for: Received Chemotherapy; Received Radiation Psychiatric Medical History: Negative for: Anorexia/bulimia; Confinement Anxiety HBO Extended History Items Ear/Nose/Mouth/Throat: Chronic sinus problems/congestion Immunizations Pneumococcal Vaccine: Received Pneumococcal Vaccination:  Yes Received Pneumococcal Vaccination On or After 60th Birthday: Yes Implantable Devices None Hospitalization / Surgery History Type of Hospitalization/Surgery right femur fx ORIR right knee replacement left breast mass excision left ankle tibia repair abdominal hysterectomy adnoidectomy/tonsillectomy cholecystectomy dental implants patoid cystectomy tubal ligation Family and Social History Cancer: Yes - Father,Paternal Grandparents; Diabetes: No; Heart Disease: Yes - Mother,Father,Paternal Grandparents; Hereditary Spherocytosis: No; Hypertension: Yes - Mother; Kidney Disease: No; Lung Disease: No; Seizures: No; Stroke: Yes - Maternal Grandparents; Thyroid Problems: No; Tuberculosis: No; Never smoker; Marital Status - Widowed; Alcohol Use: Rarely; Drug Use: Prior History - TCH; Caffeine Use: Daily - soda, tea; Financial Concerns: No; Food, Clothing or Shelter Needs: No; Support System Lacking: No; Transportation Concerns: No Electronic Signature(s) Signed: 01/30/2023 3:57:36 PM By: Duanne Guess MD FACS Entered By: Duanne Guess on 01/30/2023 15:41:20 -------------------------------------------------------------------------------- SuperBill Details Patient Name: Date of Service: [name] 01/30/2023 Medical Record Number: [id number] Patient Account Number: [id number] Date of Birth/Sex: Treating RN: March 07, 1951 (72 y.o. F) Primary Care Provider: Vincent Cooper Other Clinician: Referring Provider: Treating Provider/Extender: [name] in Treatment: [address] ([id number]) [id number] Page 11 of 11 Diagnosis Coding ICD-10 Codes Code Description L89.314 Pressure ulcer of right buttock, stage 4 L89.614 Pressure ulcer of right heel, stage 4 I73.00 Raynaud's syndrome without gangrene I50.32 Chronic diastolic (congestive) heart failure N18.32 Chronic kidney disease, stage 3b R73.02 Impaired glucose tolerance  (oral) E66.9 Obesity, unspecified Facility Procedures : CPT4 Code: 08657846 Description: 11042 - DEB SUBQ TISSUE 20 SQ CM/< ICD-10 Diagnosis Description L89.614 Pressure ulcer of right heel, stage 4 Modifier: Quantity: 1 : CPT4 Code: [id number] Description: 97605 - WOUND VAC-50 SQ CM OR LESS Modifier: 59 Quantity: 1 Physician Procedures : CPT4 Code Description Modifier [id number] 99214 - WC PHYS LEVEL 4 - EST PT 25 ICD-10 Diagnosis Description L89.314 Pressure ulcer of right buttock, stage 4 L89.614 Pressure ulcer of right heel, stage 4 E66.9 Obesity, unspecified I50.32 Chronic diastolic  (congestive) heart failure Quantity: 1 : [id number] 11042 - WC PHYS SUBQ TISS 20 SQ CM ICD-10 Diagnosis Description L89.614 Pressure ulcer of right heel, stage 4 Quantity: 1 Electronic Signature(s) Signed: 01/30/2023 4:43:15 PM By: Zenaida Deed RN, BSN Signed: 01/31/2023 8:53:37 AM By: Duanne Guess MD FACS Previous Signature: 01/30/2023 3:46:00 PM Version By: Duanne Guess MD FACS Entered By: Zenaida Deed on 01/30/2023 16:25:40

## 2023-02-01 DIAGNOSIS — I5032 Chronic diastolic (congestive) heart failure: Secondary | ICD-10-CM | POA: Diagnosis not present

## 2023-02-01 DIAGNOSIS — N1832 Chronic kidney disease, stage 3b: Secondary | ICD-10-CM | POA: Diagnosis not present

## 2023-02-01 DIAGNOSIS — L89313 Pressure ulcer of right buttock, stage 3: Secondary | ICD-10-CM | POA: Diagnosis not present

## 2023-02-01 DIAGNOSIS — L89314 Pressure ulcer of right buttock, stage 4: Secondary | ICD-10-CM | POA: Diagnosis not present

## 2023-02-01 DIAGNOSIS — R7302 Impaired glucose tolerance (oral): Secondary | ICD-10-CM | POA: Diagnosis not present

## 2023-02-05 DIAGNOSIS — L89614 Pressure ulcer of right heel, stage 4: Secondary | ICD-10-CM | POA: Diagnosis not present

## 2023-02-07 ENCOUNTER — Encounter (HOSPITAL_BASED_OUTPATIENT_CLINIC_OR_DEPARTMENT_OTHER): Payer: PPO | Admitting: General Surgery

## 2023-02-07 DIAGNOSIS — L89314 Pressure ulcer of right buttock, stage 4: Secondary | ICD-10-CM | POA: Diagnosis not present

## 2023-02-07 DIAGNOSIS — L89614 Pressure ulcer of right heel, stage 4: Secondary | ICD-10-CM | POA: Diagnosis not present

## 2023-02-07 NOTE — Progress Notes (Signed)
Marisa, Cooper ([id number]) [phone].pdf Page 1 of 8 Visit Report for 02/07/2023 Arrival Information Details Patient Name: Date of Service: [name], [name] 02/07/2023 2:00 PM Medical Record Number: [id number] Patient Account Number: [id number] Date of Birth/Sex: Treating RN: August 07, 1951 (72 y.o. F) Primary Care Lynford Espinoza: Vincent Gros Other Clinician: Referring Regana Kemple: Treating Luka Stohr/Extender: Greer Pickerel in Treatment: 21 Visit Information History Since Last Visit All ordered tests and consults were completed: No Patient Arrived: [name] Added or deleted any medications: No Arrival Time: 14:04 Any new allergies or adverse reactions: No Accompanied By: son Had a fall or experienced change in No Transfer Assistance: None activities of daily living that may affect Patient Identification Verified: Yes risk of falls: Secondary Verification Process Completed: Yes Signs or symptoms of abuse/neglect since last visito No Patient Requires Transmission-Based Precautions: No Hospitalized since last visit: No Patient Has Alerts: No Implantable device outside of the clinic excluding No cellular tissue based products placed in the center since last visit: Pain Present Now: No Electronic Signature(s) Signed: 02/07/2023 4:13:56 PM By: Dayton Scrape Entered By: Dayton Scrape on 02/07/2023 14:05:11 -------------------------------------------------------------------------------- Encounter Discharge Information Details Patient Name: Date of Service: [name]. 02/07/2023 2:00 PM Medical Record Number: [id number] Patient Account Number: [id number] Date of Birth/Sex: Treating RN: Sep 21, 1950 (72 y.o. Orville Govern Primary Care Kayleann Mccaffery: Vincent Gros Other Clinician: Referring Sharyon Peitz: Treating Laurel Harnden/Extender: Greer Pickerel in Treatment: 21 Encounter Discharge Information Items Post Procedure  Vitals Discharge Condition: Stable Temperature (F): 97.8 Ambulatory Status: Walker Pulse (bpm): 68 Discharge Destination: Home Respiratory Rate (breaths/min): 18 Transportation: Private Auto Blood Pressure (mmHg): 159/79 Accompanied By: self Schedule Follow-up Appointment: Yes Clinical Summary of Care: Patient Declined Electronic Signature(s) Signed: 02/07/2023 4:16:09 PM By: Redmond Pulling RN, BSN Entered By: Redmond Pulling on 02/07/2023 15:45:15 [name] ([id number]) [id number].pdf Page 2 of 8 -------------------------------------------------------------------------------- Lower Extremity Assessment Details Patient Name: Date of Service: Marisa, Cooper 02/07/2023 2:00 PM Medical Record Number: [id number] Patient Account Number: [id number] Date of Birth/Sex: Treating RN: 06-30-1951 (72 y.o. F) Primary Care Chace Bisch: Vincent Gros Other Clinician: Referring Eldean Nanna: Treating Rockford Leinen/Extender: Greer Pickerel in Treatment: 21 Edema Assessment Assessed: [Left: No] [Right: No] Edema: [Left: Ye] [Right: s] Calf Left: Right: Point of Measurement: From Medial Instep 42 cm Ankle Left: Right: Point of Measurement: From Medial Instep 26.5 cm Electronic Signature(s) Signed: 02/07/2023 4:13:56 PM By: Dayton Scrape Entered By: Dayton Scrape on 02/07/2023 14:17:11 -------------------------------------------------------------------------------- Multi Wound Chart Details Patient Name: Date of Service: [name]. 02/07/2023 2:00 PM Medical Record Number: [id number] Patient Account Number: [id number] Date of Birth/Sex: Treating RN: 12-11-1950 (72 y.o. F) Primary Care Ruwayda Curet: Vincent Gros Other Clinician: Referring Rita Vialpando: Treating Garland Hincapie/Extender: Greer Pickerel in Treatment: 21 Vital Signs Height(in): 62 Pulse(bpm): 65 Weight(lbs): 207 Blood Pressure(mmHg): 159/79 Body Mass Index(BMI):  37.9 Temperature(F): 97.8 Respiratory Rate(breaths/min): 20 [1:Photos:] [N/A:N/A] Right Gluteus Right Calcaneus N/A Wound Location: Pressure Injury Pressure Injury N/A Wounding Event: Pressure Ulcer Pressure Ulcer N/A Primary Etiology: Chronic sinus problems/congestion, Chronic sinus problems/congestion, N/A Comorbid History: Sleep Apnea, Congestive Heart Sleep Apnea, Congestive Heart Failure, Raynauds, Rheumatoid Failure, Raynauds, Rheumatoid MARRI, MCNEFF ([id number]) [id number] Page 3 of 8 Arthritis, Neuropathy Arthritis, Neuropathy 08/11/2022 09/21/2022 N/A Date Acquired: 21 18 N/A Weeks of Treatment: Open Open N/A Wound Status: No No N/A Wound Recurrence: 3.5x2.7x9.8 2.2x3.9x1 N/A Measurements L x W x D (cm) 7.422 6.739 N/A A (cm) : rea 72.736 6.739 N/A Volume (cm) :  66.20% 30.20% N/A % Reduction in A rea: 44.90% -597.60% N/A % Reduction in Volume: Category/Stage IV Category/Stage IV N/A Classification: Large Medium N/A Exudate A mount: Serosanguineous Serosanguineous N/A Exudate Type: red, brown red, brown N/A Exudate Color: Well defined, not attached Distinct, outline attached N/A Wound Margin: Large (67-100%) Medium (34-66%) N/A Granulation A mount: Red Red, Pale, Hyper-granulation N/A Granulation Quality: None Present (0%) Medium (34-66%) N/A Necrotic A mount: Fat Layer (Subcutaneous Tissue): Yes Fat Layer (Subcutaneous Tissue): Yes N/A Exposed Structures: Fascia: No Tendon: Yes Tendon: No Bone: Yes Muscle: No Fascia: No Joint: No Muscle: No Bone: No Joint: No None None N/A Epithelialization: N/A Debridement - Selective/Open Wound N/A Debridement: Pre-procedure Verification/Time Out N/A 14:35 N/A Taken: N/A Lidocaine 5% topical ointment N/A Pain Control: N/A Slough N/A Tissue Debrided: N/A Non-Viable Tissue N/A Level: N/A 6.74 N/A Debridement A (sq cm): rea N/A Curette N/A Instrument: N/A Minimum  N/A Bleeding: N/A Pressure N/A Hemostasis A chieved: N/A Procedure was tolerated well N/A Debridement Treatment Response: N/A 2.2x3.9x1 N/A Post Debridement Measurements L x W x D (cm) N/A 6.739 N/A Post Debridement Volume: (cm) N/A Category/Stage IV N/A Post Debridement Stage: Excoriation: No No Abnormalities Noted N/A Periwound Skin Texture: Induration: No Callus: No Crepitus: No Rash: No Scarring: No Maceration: No Maceration: No N/A Periwound Skin Moisture: Dry/Scaly: No Dry/Scaly: No Atrophie Blanche: No No Abnormalities Noted N/A Periwound Skin Color: Cyanosis: No Ecchymosis: No Erythema: No Hemosiderin Staining: No Mottled: No Pallor: No Rubor: No No Abnormality No Abnormality N/A Temperature: Yes Yes N/A Tenderness on Palpation: N/A Debridement N/A Procedures Performed: Treatment Notes Electronic Signature(s) Signed: 02/07/2023 2:58:33 PM By: Duanne Guess MD FACS Entered By: Duanne Guess on 02/07/2023 14:58:33 -------------------------------------------------------------------------------- Multi-Disciplinary Care Plan Details Patient Name: Date of Service: [name] 02/07/2023 2:00 PM Medical Record Number: [id number] Patient Account Number: [id number] Date of Birth/Sex: Treating RN: 1951/02/09 (72 y.o. Orville Govern Primary Care Conni Knighton: Vincent Gros Other Clinician: Referring Corrie Brannen: Treating Marleny Faller/Extender: Greer Pickerel in Treatment: [address] ([id number]) [id number] Page 4 of 8 Multidisciplinary Care Plan reviewed with physician Active Inactive Pressure Nursing Diagnoses: Knowledge deficit related to management of pressures ulcers Goals: Patient will remain free of pressure ulcers Date Initiated: 09/15/2022 Date Inactivated: 09/28/2022 Target Resolution Date: 11/09/2022 Unmet Reason: new pressure ulcer Goal Status: Unmet right heel Patient/caregiver will  verbalize risk factors for pressure ulcer development Date Initiated: 09/15/2022 Target Resolution Date: 02/09/2023 Goal Status: Active Interventions: Assess potential for pressure ulcer upon admission and as needed Treatment Activities: Patient referred for pressure reduction/relief devices : 09/15/2022 Pressure reduction/relief device ordered : 09/15/2022 Notes: Wound/Skin Impairment Nursing Diagnoses: Impaired tissue integrity Knowledge deficit related to ulceration/compromised skin integrity Goals: Patient/caregiver will verbalize understanding of skin care regimen Date Initiated: 09/28/2022 Target Resolution Date: 02/09/2023 Goal Status: Active Ulcer/skin breakdown will have a volume reduction of 50% by week 8 Date Initiated: 09/28/2022 Date Inactivated: 11/01/2022 Target Resolution Date: 11/02/2022 Goal Status: Met Ulcer/skin breakdown will have a volume reduction of 80% by week 12 Date Initiated: 11/01/2022 Date Inactivated: 11/29/2022 Target Resolution Date: 11/29/2022 Unmet Reason: vac in place, still Goal Status: Unmet debriding heel Interventions: Assess patient/caregiver ability to obtain necessary supplies Assess patient/caregiver ability to perform ulcer/skin care regimen upon admission and as needed Assess ulceration(s) every visit Provide education on ulcer and skin care Treatment Activities: Skin care regimen initiated : 09/28/2022 Topical wound management initiated : 09/28/2022 Notes: Electronic Signature(s) Signed: 02/07/2023 4:16:09 PM By: Redmond Pulling RN,  BSN Entered By: Redmond Pulling on 02/07/2023 14:21:28 -------------------------------------------------------------------------------- Pain Assessment Details Patient Name: Date of Service: BRITANI, BEATTIE 02/07/2023 2:00 PM Medical Record Number: [id number] Patient Account Number: [id number] Date of Birth/Sex: Treating RN: 06/02/1951 (72 y.o. SHANAY, WOOLMAN E ([id number]) [id number]  Page 5 of 8 Primary Care Jalesia Loudenslager: Vincent Gros Other Clinician: Referring Jihan Mellette: Treating Brinna Divelbiss/Extender: Greer Pickerel in Treatment: 21 Active Problems Location of Pain Severity and Description of Pain Patient Has Paino No Site Locations Rate the pain. Current Pain Level: 0 Pain Management and Medication Current Pain Management: Electronic Signature(s) Signed: 02/07/2023 4:13:56 PM By: Dayton Scrape Entered By: Dayton Scrape on 02/07/2023 14:05:46 -------------------------------------------------------------------------------- Patient/Caregiver Education Details Patient Name: Date of Service: [name] 6/26/2024andnbsp2:00 PM Medical Record Number: [id number] Patient Account Number: [id number] Date of Birth/Gender: Treating RN: 1950/10/01 (72 y.o. Orville Govern Primary Care Physician: Vincent Gros Other Clinician: Referring Physician: Treating Physician/Extender: Greer Pickerel in Treatment: 21 Education Assessment Education Provided To: Patient Education Topics Provided Wound/Skin Impairment: Methods: Explain/Verbal Responses: State content correctly [employer]) Signed: 02/07/2023 4:16:09 PM By: Redmond Pulling RN, BSN Entered By: Redmond Pulling on 02/07/2023 14:21:49 SHERENE, PLANCARTE ([id number]) [id number] Page 6 of 8 -------------------------------------------------------------------------------- Wound Assessment Details Patient Name: Date of Service: BERLINDA, FARVE 02/07/2023 2:00 PM Medical Record Number: [id number] Patient Account Number: [id number] Date of Birth/Sex: Treating RN: 12/15/1950 (72 y.o. Orville Govern Primary Care Gregorio Worley: Vincent Gros Other Clinician: Referring Pratik Dalziel: Treating Kamaree Wheatley/Extender: Greer Pickerel in Treatment: 21 Wound Status Wound Number: 1 Primary Pressure Ulcer Etiology: Wound Location:  Right Gluteus Wound Open Wounding Event: Pressure Injury Status: Date Acquired: 08/11/2022 Comorbid Chronic sinus problems/congestion, Sleep Apnea, Congestive Heart Weeks Of Treatment: 21 History: Failure, Raynauds, Rheumatoid Arthritis, Neuropathy Clustered Wound: No Photos Wound Measurements Length: (cm) 3.5 Width: (cm) 2.7 Depth: (cm) 9.8 Area: (cm) 7.422 Volume: (cm) 72.736 % Reduction in Area: 66.2% % Reduction in Volume: 44.9% Epithelialization: None Tunneling: No Undermining: No Wound Description Classification: Category/Stage IV Wound Margin: Well defined, not attached Exudate Amount: Large Exudate Type: Serosanguineous Exudate Color: red, brown Foul Odor After Cleansing: No Slough/Fibrino Yes Wound Bed Granulation Amount: Large (67-100%) Exposed Structure Granulation Quality: Red Fascia Exposed: No Necrotic Amount: None Present (0%) Fat Layer (Subcutaneous Tissue) Exposed: Yes Tendon Exposed: No Muscle Exposed: No Joint Exposed: No Bone Exposed: No Periwound Skin Texture Texture Color No Abnormalities Noted: No No Abnormalities Noted: Yes Callus: No Temperature / Pain Crepitus: No Temperature: No Abnormality Excoriation: No Tenderness on Palpation: Yes Induration: No Rash: No Scarring: No Moisture No Abnormalities Noted: Yes Electronic Signature(sLATORSHA, CURLING ([id number]) [id number].pdf Page 7 of 8 Signed: 02/07/2023 4:16:09 PM By: Redmond Pulling RN, BSN Entered By: Redmond Pulling on 02/07/2023 14:24:10 -------------------------------------------------------------------------------- Wound Assessment Details Patient Name: Date of Service: Marisa AZILEE, PIRRO 02/07/2023 2:00 PM Medical Record Number: [id number] Patient Account Number: [id number] Date of Birth/Sex: Treating RN: Aug 07, 1951 (72 y.o. F) Primary Care Jeraldean Wechter: Vincent Gros Other Clinician: Referring Gaberiel Youngblood: Treating Shiquan Mathieu/Extender: Greer Pickerel in Treatment: 21 Wound Status Wound Number: 3 Primary Pressure Ulcer Etiology: Wound Location: Right Calcaneus Wound Open Wounding Event: Pressure Injury Status: Date Acquired: 09/21/2022 Comorbid Chronic sinus problems/congestion, Sleep Apnea, Congestive Heart Weeks Of Treatment: 18 History: Failure, Raynauds, Rheumatoid Arthritis, Neuropathy Clustered Wound: No Photos Wound Measurements Length: (cm) 2.2 Width: (cm) 3.9 Depth: (cm) 1 Area: (cm) 6.739 Volume: (cm) 6.739 % Reduction in Area: 30.2% % Reduction  in Volume: -597.6% Epithelialization: None Tunneling: No Undermining: No Wound Description Classification: Category/Stage IV Wound Margin: Distinct, outline attached Exudate Amount: Medium Exudate Type: Serosanguineous Exudate Color: red, brown Foul Odor After Cleansing: No Slough/Fibrino Yes Wound Bed Granulation Amount: Medium (34-66%) Exposed Structure Granulation Quality: Red, Pale, Hyper-granulation Fascia Exposed: No Necrotic Amount: Medium (34-66%) Fat Layer (Subcutaneous Tissue) Exposed: Yes Necrotic Quality: Adherent Slough Tendon Exposed: Yes Muscle Exposed: No Joint Exposed: No Bone Exposed: Yes Periwound Skin Texture Texture Color No Abnormalities Noted: Yes No Abnormalities Noted: Yes Moisture Temperature / Pain No Abnormalities Noted: No Temperature: No Abnormality Dry / Scaly: No Tenderness on Palpation: Yes Maceration: No PATSIE, MCCARDLE ([id number]) [id number] Page 8 of 8 Electronic Signature(s) Signed: 02/07/2023 4:16:09 PM By: Redmond Pulling RN, BSN Entered By: Redmond Pulling on 02/07/2023 14:23:26 -------------------------------------------------------------------------------- Vitals Details Patient Name: Date of Service: [name] 02/07/2023 2:00 PM Medical Record Number: [id number] Patient Account Number: [id number] Date of Birth/Sex: Treating RN: Jul 27, 1951 (72 y.o.  F) Primary Care Keymari Sato: Vincent Gros Other Clinician: Referring Helix Lafontaine: Treating Money Mckeithan/Extender: Greer Pickerel in Treatment: 21 Vital Signs Time Taken: 02:05 Temperature (F): 97.8 Height (in): 62 Pulse (bpm): 65 Weight (lbs): 207 Respiratory Rate (breaths/min): 20 Body Mass Index (BMI): 37.9 Blood Pressure (mmHg): 159/79 Reference Range: 80 - 120 mg / dl Electronic Signature(s) Signed: 02/07/2023 4:13:56 PM By: Dayton Scrape Entered By: Dayton Scrape on 02/07/2023 14:05:32

## 2023-02-07 NOTE — Progress Notes (Addendum)
SASHENKA, FAKES ([id number]) [id number] Page 1 of 11 Visit Report for 02/07/2023 Chief Complaint Document Details Patient Name: Date of Service: [name], [name] 02/07/2023 2:00 PM Medical Record Number: [id number] Patient Account Number: [id number] Date of Birth/Sex: Treating RN: Nov 14, 1950 (72 y.o. F) Primary Care Provider: Vincent Gros Other Clinician: Referring Provider: Treating Provider/Extender: Greer Pickerel in Treatment: 21 Information Obtained from: Patient Chief Complaint Patient is at the clinic for treatment of open pressure ulcers Electronic Signature(s) Signed: 02/07/2023 2:59:00 PM By: Duanne Guess MD FACS Entered By: Duanne Guess on 02/07/2023 14:59:00 -------------------------------------------------------------------------------- Debridement Details Patient Name: Date of Service: Marisa Score. 02/07/2023 2:00 PM Medical Record Number: [id number] Patient Account Number: [id number] Date of Birth/Sex: Treating RN: 18-Feb-1951 (72 y.o. F) Primary Care Provider: Vincent Gros Other Clinician: Referring Provider: Treating Provider/Extender: Greer Pickerel in Treatment: 21 Debridement Performed for Assessment: Wound #3 Right Calcaneus Performed By: Physician Duanne Guess, MD Debridement Type: Debridement Level of Consciousness (Pre-procedure): Awake and Alert Pre-procedure Verification/Time Out Yes - 14:35 Taken: Start Time: 14:39 Pain Control: Lidocaine 5% topical ointment Percent of Wound Bed Debrided: 100% T Area Debrided (cm): otal 6.74 Tissue and other material debrided: Non-Viable, Slough, Slough Level: Non-Viable Tissue Debridement Description: Selective/Open Wound Instrument: Curette Bleeding: Minimum Hemostasis Achieved: Pressure Response to Treatment: Procedure was tolerated well Level of Consciousness (Post- Awake and Alert procedure): Post Debridement  Measurements of Total Wound Length: (cm) 2.2 Stage: Category/Stage IV Width: (cm) 3.9 Depth: (cm) 1 Volume: (cm) 6.739 Character of Wound/Ulcer Post Debridement: Improved Post Procedure Diagnosis Same as [name], [name] ([id number]) [id number] Page 2 of 11 Notes Scribed for Dr Lady Gary by Redmond Pulling, RN Electronic Signature(s) Signed: 02/07/2023 2:58:53 PM By: Duanne Guess MD FACS Entered By: Duanne Guess on 02/07/2023 14:58:53 -------------------------------------------------------------------------------- HPI Details Patient Name: Date of Service: Marisa Roux Cooper. 02/07/2023 2:00 PM Medical Record Number: [id number] Patient Account Number: [id number] Date of Birth/Sex: Treating RN: [birth year] (72 y.o. F) Primary Care Provider: Vincent Gros Other Clinician: Referring Provider: Treating Provider/Extender: Greer Pickerel in Treatment: 21 History of Present Illness HPI Description: ADMISSION 09/07/2023 This is a 72 year old woman with a past medical history notable for obesity, congestive heart failure, Raynaud's syndrome, CKD stage IIIb, osteoporosis, and rheumatoid arthritis. In November 2023, she suffered a fall that resulted in a femur fracture. She was hospitalized for about a week and underwent surgical repair of the fracture. She subsequently developed pressure ulcers on her right buttocks and ischium. She has been receiving home health services and they have been applying Medihoney. They have been reporting the wounds as stage II, but on evaluation, the large ulcer was probably unstageable at the time they were evaluating it but it is clearly a stage IV at this point. The smaller ulcer does have fat layer exposure and therefore is a stage III. The patient is accompanied by her daughter. She says she has been sleeping on a regular bed but recently ordered an air mattress T opper, but does not have it yet.  She is on Ozempic for weight loss and therefore has a poor appetite and struggles to get adequate protein intake. 09/15/2022: The large stage IV ulcer is substantially cleaner this week. The stage III ulcer is a little smaller. Both have slough accumulation. The culture that I took last week was polymicrobial. The Augmentin that I prescribed was adequate coverage for the species and she continues to take this. We have also ordered [facility]  topical antibiotic compound, but this has not yet arrived. 09/21/2022: The stage IV ulcer has some necrotic muscle at the base but is otherwise fairly clean. The stage III ulcer is smaller again with light slough on the surface. She has her Keystone topical antibiotic compound with her today. 09/29/2022: There is still some necrotic muscle at the base of the stage IV ulcer that I was unable to get to last week. The stage III ulcer has healed. She unfortunately has developed a new pressure ulcer on her right heel. 10/06/2022: Still with some devitalized muscle at the base of the stage IV ulcer, but otherwise this wound is looking quite clean. The pressure induced tissue injury on her heel is demarcating and much of the area appears to be epithelialized, but there is still 2 areas that remain questionable. 10/12/2022: The stage IV ulcer is very clean and ready for wound VAC, which will be delivered tomorrow according to the patient. The tissue injury on her heel is drying up and does not feel particularly boggy this week. 10/19/2022: The heel injury continues to improve. There is still some dry eschar present on the more plantar aspect of it. There is some slough accumulation on the surface of the stage IV pressure ulcer. The wound VAC was initiated by home health last week. 11/01/2022: The gluteal pressure ulcer is very clean without any necrotic tissue or slough. The depth has come in by over half a centimeter. The deep tissue injury on her heel continues to improve. There  is still dry eschar that we are painting with Betadine as well as some fresh-looking viable tissue at the margin. 11/15/2022: The gluteal pressure ulcer is clean and contracting. The eschar on her heel ulcer is beginning to separate from the underlying tissue. 11/29/2022: The gluteal pressure ulcer is very clean. The depth has come in by about a centimeter. The eschar has completely separated off of her heel. There is some fibrinous exudate and slough on the heel. It has declared itself as a stage III at this point. 12/13/2022: The heel ulcer is smaller and much cleaner, but still has some non-viable tissue present. The orifice of the gluteal ulcer is contracting, but the depth remains the same. Fortunately, the sponge for the [facility] is being packed appropriately into the full depth of the wound. 12/27/2022: The heel ulcer is smaller, but I think the depth and degree of tissue injury has finally declared itself, with tendon being exposed at the posterior aspect of the ulcer. It is now stage IV. The gluteal ulcer continues to contract circumferentially, but the depth is still unchanged. 01/10/2023: The heel ulcer has contracted further. There is still rubbery slough on the surface. There has been no further tissue breakdown; I think the last of the nonviable tissue was removed at her visit 2 weeks ago. The gluteal ulcer has contracted further circumferentially, but there is still a tunnel that ankles off cranially that remains about the same depth. The tissues appear viable at both sites. 01/24/2023: Unfortunately, the home health nurse that was applying the wound VAC did not put any drape on her skin for the bridge and was applying sponge directly to the patient's skin. This is resulted in significant tissue breakdown and pain for the patient. Once we were notified of this, we discontinued the wound VAC and they have been packing the wound with saline moistened gauze. The heel has also deteriorated. Bone is now  exposed. 01/30/2023: Her skin looks significantly better this week. It  has recovered from the insult caused by applying the sponge directly to it. Her heel looks a little bit better as well. The culture that I took last week grew out group A strep, Proteus mirabilis, and Enterococcus, along with other skin flora. Although Levaquin was the recommended antibiotic, she is on amiodarone and therefore Levaquin is contraindicated. She has been taking Augmentin and is showing signs of improvement. 02/07/2023: The insult to her skin caused by direct application of wound VAC sponge has nearly completely healed. The gluteal ulcer cavity continues to contract circumferentially, but still probes quite deeply; I suspect the sponge for her VAC is not getting placed completely down into that tunnel. The heel is SMANTHA, EHRENREICH ([id number]) [id number] Page 3 of 11 looking better today. There is more granulation filling in. I did not feel bone today. Electronic Signature(s) Signed: 02/07/2023 3:00:24 PM By: Duanne Guess MD FACS Entered By: Duanne Guess on 02/07/2023 15:00:23 -------------------------------------------------------------------------------- Physical Exam Details Patient Name: Date of Service: Marisa Score 02/07/2023 2:00 PM Medical Record Number: [id number] Patient Account Number: [id number] Date of Birth/Sex: Treating RN: 1951/03/11 (72 y.o. F) Primary Care Provider: Vincent Gros Other Clinician: Referring Provider: Treating Provider/Extender: Greer Pickerel in Treatment: 21 Constitutional Hypertensive, asymptomatic. . . . no acute distress. Respiratory Normal work of breathing on room air. Notes 02/07/2023: The insult to her skin caused by direct application of wound VAC sponge has nearly completely healed. The gluteal ulcer cavity continues to contract circumferentially, but still probes quite deeply. The heel is looking better  today. There is more granulation filling in. I did not feel bone today. Electronic Signature(s) Signed: 02/07/2023 3:01:06 PM By: Duanne Guess MD FACS Entered By: Duanne Guess on 02/07/2023 15:01:06 -------------------------------------------------------------------------------- Physician Orders Details Patient Name: Date of Service: Marisa Score 02/07/2023 2:00 PM Medical Record Number: [id number] Patient Account Number: [id number] Date of Birth/Sex: Treating RN: 1951-05-20 (72 y.o. Orville Govern Primary Care Provider: Vincent Gros Other Clinician: Referring Provider: Treating Provider/Extender: Greer Pickerel in Treatment: 3 Verbal / Phone Orders: No Diagnosis Coding ICD-10 Coding Code Description L89.314 Pressure ulcer of right buttock, stage 4 L89.614 Pressure ulcer of right heel, stage 4 I73.00 Raynaud's syndrome without gangrene I50.32 Chronic diastolic (congestive) heart failure N18.32 Chronic kidney disease, stage 3b R73.02 Impaired glucose tolerance (oral) E66.9 Obesity, unspecified Follow-up Appointments ppointment in 1 week. - Dr. Lady Gary RM 1 Return A Wed 7/3 @ 2:00 pm Anesthetic [name], [name] ([id number]) [id number] Page 4 of 11 Wound #1 Right Gluteus (In clinic) Topical Lidocaine 4% applied to wound bed Bathing/ Shower/ Hygiene May shower and wash wound with soap and water. Negative Presssure Wound Therapy Wound #1 Right Gluteus Wound Vac to wound continuously at 161mm/hg pressure - resume changes 3 times per week Black Foam Off-Loading Heel suspension boot to: - globoped right foot to ambulate Low air-loss mattress (Group 2) - Adapt Turn and reposition every 2 hours - avoid lying on back, stand at least every hour while out of bed, float heels off bed with pillows under calves while in bed Prevalon Boot - right foot esp. while in bed. Additional Orders / Instructions Follow Nutritious  Diet - increase protein to 70-80 gms per day, hold ozempic Juven Shake 1-2 times daily. Home Health New wound care orders this week; continue Home Health for wound care. May utilize formulary equivalent dressing for wound treatment orders unless otherwise specified. - resume VAC, bridge up toward back, prisma to base  of wound then place black foam Other Home Health Orders/Instructions: - [facility] Wound Treatment Wound #1 - Gluteus Wound Laterality: Right Cleanser: Wound Cleanser 3 x Per Week/30 Days Discharge Instructions: Cleanse the wound with wound cleanser prior to applying a clean dressing using gauze sponges, not tissue or cotton balls. Peri-Wound Care: Nystop Powder (Nystatin) 3 x Per Week/30 Days Discharge Instructions: Apply Nystop (Nystatin) Powder as needed Prim Dressing: Promogran Prisma Matrix, 4.34 (sq in) (silver collagen) 3 x Per Week/30 Days ary Discharge Instructions: Moisten collagen with saline or hydrogel Prim Dressing: VAC ary 3 x Per Week/30 Days Wound #3 - Calcaneus Wound Laterality: Right Peri-Wound Care: Zinc Oxide Ointment 30g tube 1 x Per Day/30 Days Discharge Instructions: Apply Zinc Oxide to periwound with each dressing change Topical: Gentamicin 1 x Per Day/30 Days Discharge Instructions: mix 50:50 with mupirocin and rub into wound bed Topical: Mupirocin Ointment 1 x Per Day/30 Days Discharge Instructions: Apply Mupirocin (Bactroban) as instructed Prim Dressing: Maxorb Extra Ag+ Alginate Dressing, 4x4.75 (in/in) 1 x Per Day/30 Days ary Discharge Instructions: Apply to wound bed as instructed Secondary Dressing: ALLEVYN Heel 4 1/2in x 5 1/2in / 10.5cm x 13.5cm 1 x Per Day/30 Days Discharge Instructions: Apply over primary dressing as directed. Secondary Dressing: Woven Gauze Sponge, Non-Sterile 4x4 in 1 x Per Day/30 Days Discharge Instructions: Apply over primary dressing as directed. Secured With: [employer], 4.5x3.1 (in/yd) 1 x Per Day/30  Days Discharge Instructions: Secure with Kerlix as directed. Patient Medications llergies: Sulfa (Sulfonamide Antibiotics), Cymbalta, Demerol, Iodinated Contrast Media, morphine, sulfasalazine, adhesive A Notifications Medication Indication Start End 02/07/2023 lidocaine DOSE topical 5 % ointment - ointment topical once daily Electronic Signature(s) Signed: 02/07/2023 4:05:17 PM By: Duanne Guess MD FACS Signed: 02/07/2023 4:16:09 PM By: Redmond Pulling RN, BSN Previous Signature: 02/07/2023 3:11:40 PM Version By: Duanne Guess MD FACS [name], [name] ([id number]) [id number] Page 5 of 11 Previous Signature: 02/07/2023 3:11:40 PM Version By: Duanne Guess MD FACS Entered By: Redmond Pulling on 02/07/2023 15:46:37 -------------------------------------------------------------------------------- Problem List Details Patient Name: Date of Service: Marisa Franne Grip. 02/07/2023 2:00 PM Medical Record Number: [id number] Patient Account Number: [id number] Date of Birth/Sex: Treating RN: November 19, 1950 (72 y.o. F) Primary Care Provider: Vincent Gros Other Clinician: Referring Provider: Treating Provider/Extender: Greer Pickerel in Treatment: 21 Active Problems ICD-10 Encounter Code Description Active Date MDM Diagnosis L89.314 Pressure ulcer of right buttock, stage 4 09/07/2022 No Yes L89.614 Pressure ulcer of right heel, stage 4 09/28/2022 No Yes I73.00 Raynaud's syndrome without gangrene 09/07/2022 No Yes I50.32 Chronic diastolic (congestive) heart failure 09/07/2022 No Yes N18.32 Chronic kidney disease, stage 3b 09/07/2022 No Yes R73.02 Impaired glucose tolerance (oral) 09/07/2022 No Yes E66.9 Obesity, unspecified 09/07/2022 No Yes Inactive Problems ICD-10 Code Description Active Date Inactive Date L89.313 Pressure ulcer of right buttock, stage 3 09/07/2022 09/07/2022 Resolved Problems Electronic Signature(s) Signed: 02/07/2023 2:58:22 PM  By: Duanne Guess MD FACS Entered By: Duanne Guess on 02/07/2023 14:58:22 [name] ([id number]) [id number] Page 6 of 11 -------------------------------------------------------------------------------- Progress Note Details Patient Name: Date of Service: [name], [name] 02/07/2023 2:00 PM Medical Record Number: [id number] Patient Account Number: [id number] Date of Birth/Sex: Treating RN: Aug 27, 1950 (72 y.o. F) Primary Care Provider: Vincent Gros Other Clinician: Referring Provider: Treating Provider/Extender: Greer Pickerel in Treatment: 21 Subjective Chief Complaint Information obtained from Patient Patient is at the clinic for treatment of open pressure ulcers History of Present Illness (HPI) ADMISSION 09/07/2023 This is a 72 year old woman  with a past medical history notable for obesity, congestive heart failure, Raynaud's syndrome, CKD stage IIIb, osteoporosis, and rheumatoid arthritis. In November 2023, she suffered a fall that resulted in a femur fracture. She was hospitalized for about a week and underwent surgical repair of the fracture. She subsequently developed pressure ulcers on her right buttocks and ischium. She has been receiving home health services and they have been applying Medihoney. They have been reporting the wounds as stage II, but on evaluation, the large ulcer was probably unstageable at the time they were evaluating it but it is clearly a stage IV at this point. The smaller ulcer does have fat layer exposure and therefore is a stage III. The patient is accompanied by her daughter. She says she has been sleeping on a regular bed but recently ordered an air mattress T opper, but does not have it yet. She is on Ozempic for weight loss and therefore has a poor appetite and struggles to get adequate protein intake. 09/15/2022: The large stage IV ulcer is substantially cleaner this week. The stage III  ulcer is a little smaller. Both have slough accumulation. The culture that I took last week was polymicrobial. The Augmentin that I prescribed was adequate coverage for the species and she continues to take this. We have also ordered [facility] topical antibiotic compound, but this has not yet arrived. 09/21/2022: The stage IV ulcer has some necrotic muscle at the base but is otherwise fairly clean. The stage III ulcer is smaller again with light slough on the surface. She has her Keystone topical antibiotic compound with her today. 09/29/2022: There is still some necrotic muscle at the base of the stage IV ulcer that I was unable to get to last week. The stage III ulcer has healed. She unfortunately has developed a new pressure ulcer on her right heel. 10/06/2022: Still with some devitalized muscle at the base of the stage IV ulcer, but otherwise this wound is looking quite clean. The pressure induced tissue injury on her heel is demarcating and much of the area appears to be epithelialized, but there is still 2 areas that remain questionable. 10/12/2022: The stage IV ulcer is very clean and ready for wound VAC, which will be delivered tomorrow according to the patient. The tissue injury on her heel is drying up and does not feel particularly boggy this week. 10/19/2022: The heel injury continues to improve. There is still some dry eschar present on the more plantar aspect of it. There is some slough accumulation on the surface of the stage IV pressure ulcer. The wound VAC was initiated by home health last week. 11/01/2022: The gluteal pressure ulcer is very clean without any necrotic tissue or slough. The depth has come in by over half a centimeter. The deep tissue injury on her heel continues to improve. There is still dry eschar that we are painting with Betadine as well as some fresh-looking viable tissue at the margin. 11/15/2022: The gluteal pressure ulcer is clean and contracting. The eschar on her heel  ulcer is beginning to separate from the underlying tissue. 11/29/2022: The gluteal pressure ulcer is very clean. The depth has come in by about a centimeter. The eschar has completely separated off of her heel. There is some fibrinous exudate and slough on the heel. It has declared itself as a stage III at this point. 12/13/2022: The heel ulcer is smaller and much cleaner, but still has some non-viable tissue present. The orifice of the gluteal ulcer is  contracting, but the depth remains the same. Fortunately, the sponge for the [facility] is being packed appropriately into the full depth of the wound. 12/27/2022: The heel ulcer is smaller, but I think the depth and degree of tissue injury has finally declared itself, with tendon being exposed at the posterior aspect of the ulcer. It is now stage IV. The gluteal ulcer continues to contract circumferentially, but the depth is still unchanged. 01/10/2023: The heel ulcer has contracted further. There is still rubbery slough on the surface. There has been no further tissue breakdown; I think the last of the nonviable tissue was removed at her visit 2 weeks ago. The gluteal ulcer has contracted further circumferentially, but there is still a tunnel that ankles off cranially that remains about the same depth. The tissues appear viable at both sites. 01/24/2023: Unfortunately, the home health nurse that was applying the wound VAC did not put any drape on her skin for the bridge and was applying sponge directly to the patient's skin. This is resulted in significant tissue breakdown and pain for the patient. Once we were notified of this, we discontinued the wound VAC and they have been packing the wound with saline moistened gauze. The heel has also deteriorated. Bone is now exposed. 01/30/2023: Her skin looks significantly better this week. It has recovered from the insult caused by applying the sponge directly to it. Her heel looks a little bit better as well. The culture  that I took last week grew out group A strep, Proteus mirabilis, and Enterococcus, along with other skin flora. Although Levaquin was the recommended antibiotic, she is on amiodarone and therefore Levaquin is contraindicated. She has been taking Augmentin and is showing signs of improvement. 02/07/2023: The insult to her skin caused by direct application of wound VAC sponge has nearly completely healed. The gluteal ulcer cavity continues to contract circumferentially, but still probes quite deeply; I suspect the sponge for her VAC is not getting placed completely down into that tunnel. The heel is looking better today. There is more granulation filling in. I did not feel bone today. Patient History Information obtained from Patient, Caregiver, Chart. Family History [name], [name] ([id number]) [id number] Page 7 of 11 Cancer - Father,Paternal Grandparents, Heart Disease - Mother,Father,Paternal Grandparents, Hypertension - Mother, Stroke - Maternal Grandparents, No family history of Diabetes, Hereditary Spherocytosis, Kidney Disease, Lung Disease, Seizures, Thyroid Problems, Tuberculosis. 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 Medical History Eyes Denies history of Cataracts, Glaucoma, Optic Neuritis Ear/Nose/Mouth/Throat Patient has history of Chronic sinus problems/congestion - chronic rhinitis Denies history of Middle ear problems Respiratory Patient has history of Sleep Apnea - uses CPAP Cardiovascular Patient has history of Congestive Heart Failure Endocrine Denies history of Type I Diabetes, Type II Diabetes Genitourinary Denies history of End Stage Renal Disease Immunological Patient has history of Raynauds Denies history of Lupus Erythematosus, Scleroderma Integumentary (Skin) Denies history of History of Burn Musculoskeletal Patient has history of  Rheumatoid Arthritis Denies history of Gout, Osteoarthritis, Osteomyelitis Neurologic Patient has history of Neuropathy Denies history of Dementia, Quadriplegia, Paraplegia, Seizure Disorder Oncologic Denies history of Received Chemotherapy, Received Radiation Psychiatric Denies history of Anorexia/bulimia, Confinement Anxiety Hospitalization/Surgery History - right femur fx ORIR. - right knee replacement. - left breast mass excision. - left ankle tibia repair. - abdominal hysterectomy. - adnoidectomy/tonsillectomy. - cholecystectomy. - dental implants. - patoid cystectomy. - tubal ligation. Medical  A Surgical History Notes nd Constitutional Symptoms (General Health) morbid obesity Cardiovascular hyperlipidemia Gastrointestinal GERD, IBS, eosinophilic esophagitis, diverticulosis, Genitourinary CKD stage 3 Immunological Sjoegren syndrome, fibromyalgia Objective Constitutional Hypertensive, asymptomatic. no acute distress. Vitals Time Taken: 2:05 AM, Height: 62 in, Weight: 207 lbs, BMI: 37.9, Temperature: 97.8 F, Pulse: 65 bpm, Respiratory Rate: 20 breaths/min, Blood Pressure: 159/79 mmHg. Respiratory Normal work of breathing on room air. General Notes: 02/07/2023: The insult to her skin caused by direct application of wound VAC sponge has nearly completely healed. The gluteal ulcer cavity continues to contract circumferentially, but still probes quite deeply. The heel is looking better today. There is more granulation filling in. I did not feel bone today. Integumentary (Hair, Skin) Wound #1 status is Open. Original cause of wound was Pressure Injury. The date acquired was: 08/11/2022. The wound has been in treatment 21 weeks. The wound is located on the Right Gluteus. The wound measures 3.5cm length x 2.7cm width x 9.8cm depth; 7.422cm^2 area and 72.736cm^3 volume. There is Fat Layer (Subcutaneous Tissue) exposed. There is no tunneling or undermining noted. There is a large amount  of serosanguineous drainage noted. The wound margin is well defined and not attached to the wound base. There is large (67-100%) red granulation within the wound bed. There is no necrotic tissue within the wound bed. The periwound skin appearance had no abnormalities noted for moisture. The periwound skin appearance had no abnormalities noted for color. The periwound skin appearance did not exhibit: Callus, Crepitus, Excoriation, Induration, Rash, Scarring. Periwound temperature was noted as No Abnormality. The periwound has tenderness on palpation. Wound #3 status is Open. Original cause of wound was Pressure Injury. The date acquired was: 09/21/2022. The wound has been in treatment 18 weeks. The wound is located on the Right Calcaneus. The wound measures 2.2cm length x 3.9cm width x 1cm depth; 6.739cm^2 area and 6.739cm^3 volume. There is bone, tendon, and Fat Layer (Subcutaneous Tissue) exposed. There is no tunneling or undermining noted. There is a medium amount of serosanguineous drainage noted. The wound margin is distinct with the outline attached to the wound base. There is medium (34-66%) red, pale, hyper - granulation within the wound bed. There is a medium (34-66%) amount of necrotic tissue within the wound bed including Adherent Slough. The periwound skin appearance had no CALISA, DIXON ([id number]) [id number] Page 8 of 11 abnormalities noted for texture. The periwound skin appearance had no abnormalities noted for color. The periwound skin appearance did not exhibit: Dry/Scaly, Maceration. Periwound temperature was noted as No Abnormality. The periwound has tenderness on palpation. 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 There was a Selective/Open Wound Non-Viable Tissue Debridement with a total area of 6.74 sq cm performed by Duanne Guess, MD. With the following instrument(s): Curette to remove Non-Viable tissue/material. Material removed includes [facility] after achieving pain control using Lidocaine 5% topical ointment. No specimens were taken. A time out was conducted at 14:35, prior to the start of the procedure. A Minimum amount of bleeding was controlled with Pressure. The procedure was tolerated well. Post Debridement Measurements: 2.2cm length x 3.9cm width x 1cm depth; 6.739cm^3 volume. Post debridement Stage noted as Category/Stage IV. Character of Wound/Ulcer Post Debridement is  improved. Post procedure Diagnosis Wound #3: Same as Pre-Procedure General Notes: Scribed for Dr Lady Gary by Redmond Pulling, RN. Plan Follow-up Appointments: Return Appointment in 1 week. - Dr. Lady Gary RM 1 Wed 7/3 @ 2:00 pm Anesthetic: Wound #1 Right Gluteus: (In clinic) Topical Lidocaine 4% applied to wound bed Bathing/ Shower/ Hygiene: May shower and wash wound with soap and water. Negative Presssure Wound Therapy: Wound #1 Right Gluteus: Wound Vac to wound continuously at 147mm/hg pressure - resume changes 3 times per week Black Foam Off-Loading: Heel suspension boot to: - globoped right foot to ambulate Low air-loss mattress (Group 2) - Adapt Turn and reposition every 2 hours - avoid lying on back, stand at least every hour while out of bed, float heels off bed with pillows under calves while in bed Prevalon Boot - right foot esp. while in bed. Additional Orders / Instructions: Follow Nutritious Diet - increase protein to 70-80 gms per day, hold ozempic Juven Shake 1-2 times daily. Home Health: New wound care orders this week; continue Home Health for wound care. May utilize formulary  equivalent dressing for wound treatment orders unless otherwise specified. - resume VAC, bridge up toward back, prisma to base of wound then place black foam Other Home Health Orders/Instructions: - [facility] The following medication(s) was prescribed: lidocaine topical 5 % ointment ointment topical once daily was prescribed at facility WOUND #1: - Gluteus Wound Laterality: Right Cleanser: Wound Cleanser 3 x Per Week/30 Days Discharge Instructions: Cleanse the wound with wound cleanser prior to applying a clean dressing using gauze sponges, not tissue or cotton balls. Peri-Wound Care: Nystop Powder (Nystatin) 3 x Per Week/30 Days Discharge Instructions: Apply Nystop (Nystatin) Powder as needed Prim Dressing: Promogran Prisma Matrix, 4.34 (sq in) (silver collagen) 3 x Per Week/30 Days ary Discharge Instructions: Moisten collagen with saline or hydrogel Prim Dressing: VAC 3 x Per Week/30 Days ary WOUND #3: - Calcaneus Wound Laterality: Right Peri-Wound Care: Zinc Oxide Ointment 30g tube 1 x Per Day/30 Days Discharge Instructions: Apply Zinc Oxide to periwound with each dressing change Topical: Gentamicin 1 x Per Day/30 Days Discharge Instructions: mix 50:50 with mupirocin and rub into wound bed Topical: Mupirocin Ointment 1 x Per Day/30 Days Discharge Instructions: Apply Mupirocin (Bactroban) as instructed Prim Dressing: Maxorb Extra Ag+ Alginate Dressing, 4x4.75 (in/in) 1 x Per Day/30 Days ary Discharge Instructions: Apply to wound bed as instructed Secondary Dressing: ALLEVYN Heel 4 1/2in x 5 1/2in / 10.5cm x 13.5cm 1 x Per Day/30 Days Discharge Instructions: Apply over primary dressing as directed. Secondary Dressing: Woven Gauze Sponge, Non-Sterile 4x4 in 1 x Per Day/30 Days Discharge Instructions: Apply over primary dressing as directed. [name], [name] ([id number]) [id number] Page 9 of 11 Secured With: [employer], 4.5x3.1 (in/yd) 1 x Per Day/30  Days Discharge Instructions: Secure with Kerlix as directed. 02/07/2023: The insult to her skin caused by direct application of wound VAC sponge has nearly completely healed. The gluteal ulcer cavity continues to contract circumferentially, but still probes quite deeply; I suspect the sponge for her VAC is not getting placed completely down into that tunnel. The heel is looking better today. There is more granulation filling in. I did not feel bone today. The gluteal wound did not require any debridement. I am going to add some collagen to be placed at the very depths of the wound and we will reiterate the need to have the sponge placed all the way into the tunnel. Continue negative pressure wound therapy. I debrided slough  from the heel wound. We will continue topical gentamicin and mupirocin to this site with silver alginate and a heel cup protector. Follow-up in 1 week. Electronic Signature(s) Signed: 02/09/2023 2:14:44 PM By: Shawn Stall RN, BSN Signed: 02/09/2023 2:47:25 PM By: Duanne Guess MD FACS Previous Signature: 02/07/2023 3:02:47 PM Version By: Duanne Guess MD FACS Entered By: Shawn Stall on 02/09/2023 14:04:13 -------------------------------------------------------------------------------- HxROS Details Patient Name: Date of Service: Marisa Roux Cooper. 02/07/2023 2:00 PM Medical Record Number: [id number] Patient Account Number: [id number] Date of Birth/Sex: Treating RN: 08-01-1951 (72 y.o. F) Primary Care Provider: Vincent Gros Other Clinician: Referring Provider: Treating Provider/Extender: Greer Pickerel in Treatment: 21 Information Obtained From Patient Caregiver Chart Constitutional Symptoms (General Health) Medical History: Past Medical History Notes: morbid obesity Eyes Medical History: Negative for: Cataracts; Glaucoma; Optic Neuritis Ear/Nose/Mouth/Throat Medical History: Positive for: Chronic sinus problems/congestion - chronic  rhinitis Negative for: Middle ear problems Respiratory Medical History: Positive for: Sleep Apnea - uses CPAP Cardiovascular Medical History: Positive for: Congestive Heart Failure Past Medical History Notes: hyperlipidemia Gastrointestinal Medical History: Past Medical History Notes: GERD, IBS, eosinophilic esophagitis, diverticulosis, Endocrine Medical HistoryELEANOR, [name] ([id number]) [id number] Page 10 of 11 Negative for: Type I Diabetes; Type II Diabetes Genitourinary Medical History: Negative for: End Stage Renal Disease Past Medical History Notes: CKD stage 3 Immunological Medical History: Positive for: Raynauds Negative for: Lupus Erythematosus; Scleroderma Past Medical History Notes: Sjoegren syndrome, fibromyalgia Integumentary (Skin) Medical History: Negative for: History of Burn Musculoskeletal Medical History: Positive for: Rheumatoid Arthritis Negative for: Gout; Osteoarthritis; Osteomyelitis Neurologic Medical History: Positive for: Neuropathy Negative for: Dementia; Quadriplegia; Paraplegia; Seizure Disorder Oncologic Medical History: Negative for: Received Chemotherapy; Received Radiation Psychiatric Medical History: Negative for: Anorexia/bulimia; Confinement Anxiety HBO Extended History Items Ear/Nose/Mouth/Throat: Chronic sinus problems/congestion Immunizations Pneumococcal Vaccine: Received Pneumococcal Vaccination: Yes Received Pneumococcal Vaccination On or After 60th Birthday: Yes Implantable Devices None Hospitalization / Surgery History Type of Hospitalization/Surgery right femur fx ORIR right knee replacement left breast mass excision left ankle tibia repair abdominal hysterectomy adnoidectomy/tonsillectomy cholecystectomy dental implants patoid cystectomy tubal ligation Family and Social History Cancer: Yes - Father,Paternal Grandparents; Diabetes: No; Heart Disease: Yes -  Mother,Father,Paternal Grandparents; Hereditary Spherocytosis: No; Hypertension: Yes - Mother; Kidney Disease: No; Lung Disease: No; Seizures: No; Stroke: Yes - Maternal Grandparents; Thyroid Problems: No; Tuberculosis: No; Never smoker; Marital Status - Widowed; Alcohol Use: Rarely; Drug Use: Prior History - TCH; Caffeine Use: Daily - soda, tea; Financial Concerns: No; Food, Clothing or Shelter Needs: No; Support System Lacking: No; Transportation Concerns: No Electronic Signature(s) [name], MONAHAN ([id number]) [id number] Page 11 of 11 Signed: 02/07/2023 3:11:40 PM By: Duanne Guess MD FACS Entered By: Duanne Guess on 02/07/2023 15:00:30 -------------------------------------------------------------------------------- SuperBill Details Patient Name: Date of Service: ZEANNA, ANTELL 02/07/2023 Medical Record Number: [id number] Patient Account Number: [id number] Date of Birth/Sex: Treating RN: [birth year] (72 y.o. F) Primary Care Provider: Vincent Gros Other Clinician: Referring Provider: Treating Provider/Extender: Greer Pickerel in Treatment: 21 Diagnosis Coding ICD-10 Codes Code Description L89.314 Pressure ulcer of right buttock, stage 4 L89.614 Pressure ulcer of right heel, stage 4 I73.00 Raynaud's syndrome without gangrene I50.32 Chronic diastolic (congestive) heart failure N18.32 Chronic kidney disease, stage 3b R73.02 Impaired glucose tolerance (oral) E66.9 Obesity, unspecified Facility Procedures : CPT4 Code: [id number] Description: 97597 - DEBRIDE WOUND 1ST 20 SQ CM OR < ICD-10 Diagnosis Description L89.614 Pressure ulcer of right heel, stage 4 Modifier: Quantity: 1 Physician Procedures : CPT4 Code Description Modifier [id number]  [id number] - WC PHYS LEVEL 4 - EST PT 25 ICD-10 Diagnosis Description L89.314 Pressure ulcer of right buttock, stage 4 L89.614 Pressure ulcer of right heel, stage 4 I50.32 Chronic diastolic  (congestive) heart failure  I73.00 Raynaud's syndrome without gangrene Quantity: 1 : [id number] 97597 - WC PHYS DEBR WO ANESTH 20 SQ CM ICD-10 Diagnosis Description L89.614 Pressure ulcer of right heel, stage 4 Quantity: 1 Electronic Signature(s) Signed: 02/07/2023 3:03:26 PM By: Duanne Guess MD FACS Entered By: Duanne Guess on 02/07/2023 15:03:26

## 2023-02-08 ENCOUNTER — Other Ambulatory Visit: Payer: Self-pay | Admitting: Physician Assistant

## 2023-02-08 ENCOUNTER — Other Ambulatory Visit: Payer: Self-pay | Admitting: Rheumatology

## 2023-02-08 ENCOUNTER — Encounter: Payer: Self-pay | Admitting: Nurse Practitioner

## 2023-02-08 DIAGNOSIS — L89314 Pressure ulcer of right buttock, stage 4: Secondary | ICD-10-CM | POA: Diagnosis not present

## 2023-02-08 NOTE — Telephone Encounter (Signed)
Please clarify how often the patient is taking promethazine? Has she had a workup for nausea?

## 2023-02-08 NOTE — Telephone Encounter (Signed)
Patient called regarding this refill. Patient states she is taking the phenergan daily, mostly every 6 hours prn. Sometimes, she can go a little longer in between doses. Patient states she is taking it so often due to the nausea from pain due to the wounds she currently has. I clarified to see if the patient has been taking it daily for years and patient states she was previously taking it prn but it is now daily.

## 2023-02-08 NOTE — Telephone Encounter (Signed)
Spoke with Dr. Corliss Skains and Sherron Ales, PA-C regarding this refill. They are both okay with refilling this time but for future refills, patient should seek a refill from wound care, pain management or PCP.   I spoke with patient and advised. Patient states that wound care will not prescribe any meds unless they are topical for the wounds. Patient declined a referral to pain management when I offered. I advised patient that for future refills she will need to get them from her PCP since she is taking this so regularly. Patient states she will try not to take it so much.

## 2023-02-08 NOTE — Telephone Encounter (Signed)
Last Fill: 12/21/2022  Next Visit: 1016/2024  Last Visit: 11/28/2022  Dx: High risk medication use   Current Dose per office note on 11/28/2022: not mentioned  Okay to refill phenergan?

## 2023-02-09 ENCOUNTER — Telehealth: Payer: Self-pay | Admitting: *Deleted

## 2023-02-09 ENCOUNTER — Encounter (HOSPITAL_BASED_OUTPATIENT_CLINIC_OR_DEPARTMENT_OTHER): Payer: PPO | Admitting: General Surgery

## 2023-02-09 ENCOUNTER — Other Ambulatory Visit: Payer: Self-pay | Admitting: Nurse Practitioner

## 2023-02-09 DIAGNOSIS — M79604 Pain in right leg: Secondary | ICD-10-CM

## 2023-02-09 DIAGNOSIS — S7291XD Unspecified fracture of right femur, subsequent encounter for closed fracture with routine healing: Secondary | ICD-10-CM

## 2023-02-09 MED ORDER — OXYCODONE HCL 5 MG PO TABS
5.0000 mg | ORAL_TABLET | Freq: Four times a day (QID) | ORAL | 0 refills | Status: DC | PRN
Start: 2023-02-09 — End: 2023-04-03

## 2023-02-09 NOTE — Telephone Encounter (Signed)
I took are of this and replied to her mychart message

## 2023-02-09 NOTE — Telephone Encounter (Signed)
Pt calling about her mychart request.  Please advise what can be done for her and she would like to be notified please.

## 2023-02-11 DIAGNOSIS — R7302 Impaired glucose tolerance (oral): Secondary | ICD-10-CM | POA: Diagnosis not present

## 2023-02-11 DIAGNOSIS — R2689 Other abnormalities of gait and mobility: Secondary | ICD-10-CM | POA: Diagnosis not present

## 2023-02-11 DIAGNOSIS — I5022 Chronic systolic (congestive) heart failure: Secondary | ICD-10-CM | POA: Diagnosis not present

## 2023-02-11 DIAGNOSIS — N1832 Chronic kidney disease, stage 3b: Secondary | ICD-10-CM | POA: Diagnosis not present

## 2023-02-11 DIAGNOSIS — L89314 Pressure ulcer of right buttock, stage 4: Secondary | ICD-10-CM | POA: Diagnosis not present

## 2023-02-11 DIAGNOSIS — L89313 Pressure ulcer of right buttock, stage 3: Secondary | ICD-10-CM | POA: Diagnosis not present

## 2023-02-11 DIAGNOSIS — I5032 Chronic diastolic (congestive) heart failure: Secondary | ICD-10-CM | POA: Diagnosis not present

## 2023-02-11 DIAGNOSIS — S72401D Unspecified fracture of lower end of right femur, subsequent encounter for closed fracture with routine healing: Secondary | ICD-10-CM | POA: Diagnosis not present

## 2023-02-11 DIAGNOSIS — M6281 Muscle weakness (generalized): Secondary | ICD-10-CM | POA: Diagnosis not present

## 2023-02-12 DIAGNOSIS — L89314 Pressure ulcer of right buttock, stage 4: Secondary | ICD-10-CM | POA: Diagnosis not present

## 2023-02-14 ENCOUNTER — Encounter (HOSPITAL_BASED_OUTPATIENT_CLINIC_OR_DEPARTMENT_OTHER): Payer: PPO | Attending: General Surgery | Admitting: General Surgery

## 2023-02-14 DIAGNOSIS — I7300 Raynaud's syndrome without gangrene: Secondary | ICD-10-CM | POA: Insufficient documentation

## 2023-02-14 DIAGNOSIS — L89314 Pressure ulcer of right buttock, stage 4: Secondary | ICD-10-CM | POA: Insufficient documentation

## 2023-02-14 DIAGNOSIS — I5032 Chronic diastolic (congestive) heart failure: Secondary | ICD-10-CM | POA: Insufficient documentation

## 2023-02-14 DIAGNOSIS — G629 Polyneuropathy, unspecified: Secondary | ICD-10-CM | POA: Insufficient documentation

## 2023-02-14 DIAGNOSIS — N1832 Chronic kidney disease, stage 3b: Secondary | ICD-10-CM | POA: Insufficient documentation

## 2023-02-14 DIAGNOSIS — L89313 Pressure ulcer of right buttock, stage 3: Secondary | ICD-10-CM | POA: Diagnosis not present

## 2023-02-14 DIAGNOSIS — M069 Rheumatoid arthritis, unspecified: Secondary | ICD-10-CM | POA: Diagnosis not present

## 2023-02-14 DIAGNOSIS — L89614 Pressure ulcer of right heel, stage 4: Secondary | ICD-10-CM | POA: Diagnosis not present

## 2023-02-14 DIAGNOSIS — I130 Hypertensive heart and chronic kidney disease with heart failure and stage 1 through stage 4 chronic kidney disease, or unspecified chronic kidney disease: Secondary | ICD-10-CM | POA: Diagnosis not present

## 2023-02-14 NOTE — Progress Notes (Signed)
[name], [name] ([id number]) [id number] Page 1 of 10 Visit Report for 02/14/2023 Arrival Information Details Patient Name: Date of Service: [name], [name] 02/14/2023 2:00 PM Medical Record Number: [id number] Patient Account Number: [id number] Date of Birth/Sex: Treating RN: 1950/10/12 (72 y.o. F) Primary Care Linnea Todisco: [name] Other Clinician: Referring Davidmichael Zarazua: Treating Beyla Loney/Extender: Greer Pickerel in Treatment: 22 Visit Information History Since Last Visit All ordered tests and consults were completed: No Patient Arrived: [name] Added or deleted any medications: No Arrival Time: 14:03 Any new allergies or adverse reactions: No Accompanied By: self Had a fall or experienced change in No Transfer Assistance: None activities of daily living that may affect Patient Identification Verified: Yes risk of falls: Secondary Verification Process Completed: Yes Signs or symptoms of abuse/neglect since last visito No Patient Requires Transmission-Based Precautions: No Hospitalized since last visit: No Patient Has Alerts: No Implantable device outside of the clinic excluding No cellular tissue based products placed in the center since last visit: Pain Present Now: No Electronic Signature(s) Signed: 02/14/2023 4:35:47 PM By: Dayton Scrape Entered By: Dayton Scrape on 02/14/2023 14:04:16 -------------------------------------------------------------------------------- Encounter Discharge Information Details Patient Name: Date of Service: Marisa Franne Grip. 02/14/2023 2:00 PM Medical Record Number: [id number] Patient Account Number: [id number] Date of Birth/Sex: Treating RN: Dec 04, 1950 (72 y.o. Orville Govern Primary Care Havah Ammon: [name] Other Clinician: Referring [name]: Treating Kehlani Vancamp/Extender: Greer Pickerel in Treatment: 22 Encounter Discharge Information Items Post Procedure  Vitals Discharge Condition: Stable Temperature (F): 99.1 Ambulatory Status: Walker Pulse (bpm): 76 Discharge Destination: Home Respiratory Rate (breaths/min): 18 Transportation: Private Auto Blood Pressure (mmHg): 113/71 Accompanied By: self Schedule Follow-up Appointment: Yes Clinical Summary of Care: Patient Declined Electronic Signature(s) Signed: 02/14/2023 4:27:51 PM By: Redmond Pulling RN, BSN Entered By: Redmond Pulling on 02/14/2023 15:16:41 [name] ([id number]) [id number] Page 2 of 10 -------------------------------------------------------------------------------- Lower Extremity Assessment Details Patient Name: Date of Service: [name], [name] 02/14/2023 2:00 PM Medical Record Number: [id number] Patient Account Number: [id number] Date of Birth/Sex: Treating RN: 1950/10/30 (72 y.o. F) Primary Care Michaila Kenney: [name] Other Clinician: Referring [name]: Treating [name]/Extender: Greer Pickerel in Treatment: 22 Edema Assessment Assessed: [Left: No] [Right: No] Edema: [Left: Ye] [Right: s] Calf Left: Right: Point of Measurement: From Medial Instep 42 cm Ankle Left: Right: Point of Measurement: From Medial Instep 26.5 cm Vascular Assessment Pulses: Dorsalis Pedis Palpable: [Right:Yes] Electronic Signature(s) Signed: 02/14/2023 4:35:47 PM By: Dayton Scrape Entered By: Dayton Scrape on 02/14/2023 14:11:07 -------------------------------------------------------------------------------- Multi Wound Chart Details Patient Name: Date of Service: Marisa Franne Grip. 02/14/2023 2:00 PM Medical Record Number: [id number] Patient Account Number: [id number] Date of Birth/Sex: Treating RN: [birth year] (72 y.o. F) Primary Care [name]: [name] Other Clinician: Referring [name]: Treating [name]/Extender: Greer Pickerel in Treatment: 22 Vital Signs Height(in): 62 Pulse(bpm):  76 Weight(lbs): 207 Blood Pressure(mmHg): 113/71 Body Mass Index(BMI): 37.9 Temperature(F): 99.1 Respiratory Rate(breaths/min): 20 [1:Photos:] [N/A:N/A [id number] Page 3 of 10] Right Gluteus Right Calcaneus N/A Wound Location: Pressure Injury Pressure Injury N/A Wounding Event: Pressure Ulcer Pressure Ulcer N/A Primary Etiology: Chronic sinus problems/congestion, Chronic sinus problems/congestion, N/A Comorbid History: Sleep Apnea, Congestive Heart Sleep Apnea, Congestive Heart Failure, Raynauds, Rheumatoid Failure, Raynauds, Rheumatoid Arthritis, Neuropathy Arthritis, Neuropathy 08/11/2022 09/21/2022 N/A Date Acquired: 22 19 N/A Weeks of Treatment: Open Open N/A Wound Status: No No N/A Wound Recurrence: 3.5x2.5x10 1.9x3.8x1 N/A Measurements L x W x D (cm) 6.872 5.671 N/A A (cm) : rea 96.789 5.671  N/A Volume (cm) : 68.80% 41.30% N/A % Reduction in A rea: 47.90% -487.10% N/A % Reduction in Volume: Category/Stage IV Category/Stage IV N/A Classification: Large Medium N/A Exudate A mount: Serosanguineous Serosanguineous N/A Exudate Type: red, brown red, brown N/A Exudate Color: Well defined, not attached Distinct, outline attached N/A Wound Margin: Large (67-100%) Medium (34-66%) N/A Granulation A mount: Red Red, Pale, Hyper-granulation N/A Granulation Quality: None Present (0%) Medium (34-66%) N/A Necrotic A mount: Fat Layer (Subcutaneous Tissue): Yes Fat Layer (Subcutaneous Tissue): Yes N/A Exposed Structures: Fascia: No Tendon: Yes Tendon: No Bone: Yes Muscle: No Fascia: No Joint: No Muscle: No Bone: No Joint: No Medium (34-66%) None N/A Epithelialization: Debridement - Selective/Open Wound Debridement - Selective/Open Wound N/A Debridement: Pre-procedure Verification/Time Out 14:20 14:20 N/A Taken: Lidocaine 5% topical ointment Lidocaine 5% topical ointment N/A Pain Control: [employer] N/A Tissue  Debrided: Non-Viable Tissue Non-Viable Tissue N/A Level: 6.87 5.67 N/A Debridement A (sq cm): rea Curette Curette N/A Instrument: Minimum Minimum N/A Bleeding: Pressure Pressure N/A Hemostasis A chieved: Procedure was tolerated well Procedure was tolerated well N/A Debridement Treatment Response: 3.5x2.5x10 1.9x3.8x1 N/A Post Debridement Measurements L x W x D (cm) 68.722 5.671 N/A Post Debridement Volume: (cm) Category/Stage IV Category/Stage IV N/A Post Debridement Stage: Excoriation: No No Abnormalities Noted N/A Periwound Skin Texture: Induration: No Callus: No Crepitus: No Rash: No Scarring: No Maceration: No Maceration: No N/A Periwound Skin Moisture: Dry/Scaly: No Dry/Scaly: No Atrophie Blanche: No No Abnormalities Noted N/A Periwound Skin Color: Cyanosis: No Ecchymosis: No Erythema: No Hemosiderin Staining: No Mottled: No Pallor: No Rubor: No No Abnormality No Abnormality N/A Temperature: Yes Yes N/A Tenderness on Palpation: Debridement Debridement N/A Procedures Performed: Treatment Notes Electronic Signature(s) Signed: 02/14/2023 2:40:55 PM By: Duanne Guess MD FACS Entered By: Duanne Guess on 02/14/2023 14:40:55 Multi-Disciplinary Care Plan Details -------------------------------------------------------------------------------- [name] ([id number]) [id number] Page 4 of 10 Patient Name: Date of Service: [name], [name] 02/14/2023 2:00 PM Medical Record Number: [id number] Patient Account Number: [id number] Date of Birth/Sex: Treating RN: 10/24/1950 (72 y.o. Orville Govern Primary Care Victorine Mcnee: [name] Other Clinician: Referring Daryus Sowash: Treating Jaleyah Longhi/Extender: Greer Pickerel in Treatment: 22 Multidisciplinary Care Plan reviewed with physician Active Inactive Pressure Nursing Diagnoses: Knowledge deficit related to management of pressures ulcers Goals: Patient  will remain free of pressure ulcers Date Initiated: 09/15/2022 Date Inactivated: 09/28/2022 Target Resolution Date: 11/09/2022 Unmet Reason: new pressure ulcer Goal Status: Unmet right heel Patient/caregiver will verbalize risk factors for pressure ulcer development Date Initiated: 09/15/2022 Target Resolution Date: 03/01/2023 Goal Status: Active Interventions: Assess potential for pressure ulcer upon admission and as needed Treatment Activities: Patient referred for pressure reduction/relief devices : 09/15/2022 Pressure reduction/relief device ordered : 09/15/2022 Notes: Wound/Skin Impairment Nursing Diagnoses: Impaired tissue integrity Knowledge deficit related to ulceration/compromised skin integrity Goals: Patient/caregiver will verbalize understanding of skin care regimen Date Initiated: 09/28/2022 Target Resolution Date: 03/08/2023 Goal Status: Active Ulcer/skin breakdown will have a volume reduction of 50% by week 8 Date Initiated: 09/28/2022 Date Inactivated: 11/01/2022 Target Resolution Date: 11/02/2022 Goal Status: Met Ulcer/skin breakdown will have a volume reduction of 80% by week 12 Date Initiated: 11/01/2022 Date Inactivated: 11/29/2022 Target Resolution Date: 11/29/2022 Unmet Reason: vac in place, still Goal Status: Unmet debriding heel Interventions: Assess patient/caregiver ability to obtain necessary supplies Assess patient/caregiver ability to perform ulcer/skin care regimen upon admission and as needed Assess ulceration(s) every visit Provide education on ulcer and skin care Treatment Activities: Skin care regimen initiated : 09/28/2022 Topical  wound management initiated : 09/28/2022 Notes: Electronic Signature(s) Signed: 02/14/2023 4:27:51 PM By: Redmond Pulling RN, BSN Entered By: Redmond Pulling on 02/14/2023 14:16:29 TYLAYA, MONTECALVO ([id number]) [id number] Page 5 of  10 -------------------------------------------------------------------------------- Negative Pressure Wound Therapy Maintenance (NPWT) Details Patient Name: Date of Service: XARIAH, VOLKERT 02/14/2023 2:00 PM Medical Record Number: [id number] Patient Account Number: [id number] Date of Birth/Sex: Treating RN: January 29, 1951 (72 y.o. Orville Govern Primary Care Porshea Janowski: [name] Other Clinician: Referring Zuleima Haser: Treating Mattox Schorr/Extender: Greer Pickerel in Treatment: 22 NPWT Maintenance Performed for: Wound #1 Right Gluteus Additional Injuries Covered: No Performed By: Redmond Pulling, RN Type: VAC System Coverage Size (sq cm): 8.75 Pressure Type: Constant Pressure Setting: 125 mmHG Drain Type: None Primary Contact: None Sponge/Dressing Type: Foam- Black Date Initiated: 10/16/2022 Dressing Removed: Yes Quantity of Sponges/Gauze Removed: 1 Canister Changed: No Canister Exudate Volume: 0 Dressing Reapplied: Yes Quantity of Sponges/Gauze Inserted: 1 Respones T Treatment: o pt tolerated well Days On NPWT : 122 Post Procedure Diagnosis Same as Pre-procedure Electronic Signature(s) Signed: 02/14/2023 4:27:51 PM By: Redmond Pulling RN, BSN Entered By: Redmond Pulling on 02/14/2023 15:12:26 -------------------------------------------------------------------------------- Pain Assessment Details Patient Name: Date of Service: [name] 02/14/2023 2:00 PM Medical Record Number: [id number] Patient Account Number: [id number] Date of Birth/Sex: Treating RN: 10/16/1950 (72 y.o. F) Primary Care [address]: [name] Other Clinician: Referring Laiyah Exline: Treating Faye Strohman/Extender: Greer Pickerel in Treatment: 22 Active Problems Location of Pain Severity and Description of Pain Patient Has Paino No Site Locations [place], [place] [name] ([id number]) [id number] Page 6 of 10 Pain Management and  Medication Current Pain Management: Electronic Signature(s) Signed: 02/14/2023 4:35:47 PM By: Dayton Scrape Entered By: Dayton Scrape on 02/14/2023 14:04:45 -------------------------------------------------------------------------------- Patient/Caregiver Education Details Patient Name: Date of Service: Marisa Franne Grip 7/3/2024andnbsp2:00 PM Medical Record Number: [id number] Patient Account Number: [id number] Date of Birth/Gender: Treating RN: 1951-01-20 (72 y.o. Orville Govern Primary Care Physician: [name] Other Clinician: Referring Physician: Treating Physician/Extender: Greer Pickerel in Treatment: 22 Education Assessment Education Provided To: Patient Education Topics Provided Wound/Skin Impairment: Methods: Explain/Verbal Responses: State content correctly [employer]) Signed: 02/14/2023 4:27:51 PM By: Redmond Pulling RN, BSN Entered By: Redmond Pulling on 02/14/2023 14:17:37 -------------------------------------------------------------------------------- Wound Assessment Details Patient Name: Date of Service: [name] 02/14/2023 2:00 PM Medical Record Number: [id number] Patient Account Number: [id number] Date of Birth/Sex: Treating RN: [birth year] (72 y.o. 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 Wound Open Wounding Event: Pressure Injury Status: Date Acquired: 08/11/2022 Comorbid Chronic sinus problems/congestion, Sleep Apnea, Congestive Heart Weeks Of Treatment: 22 History: Failure, Raynauds, Rheumatoid Arthritis, Neuropathy Clustered Wound: No Photos Wound Measurements Length: (cm) 3.5 Width: (cm) 2.5 Depth: (cm) 10 Area:  (cm) 6.872 Volume: (cm) 68.722 % Reduction in Area: 68.8% % Reduction in Volume: 47.9% Epithelialization: Medium (34-66%) Tunneling: No Undermining: No Wound Description Classification: Category/Stage IV Wound Margin: Well defined, not attached Exudate Amount: Large Exudate Type: Serosanguineous Exudate Color: red, brown Foul Odor After Cleansing: No Slough/Fibrino Yes Wound Bed Granulation Amount: Large (67-100%) Exposed Structure Granulation Quality: Red Fascia Exposed: No Necrotic Amount: None Present (0%) Fat Layer (Subcutaneous Tissue) Exposed: Yes Tendon Exposed: No Muscle Exposed: No Joint Exposed: No Bone Exposed: No Periwound Skin Texture Texture Color No Abnormalities Noted: No  No Abnormalities Noted: Yes Callus: No Temperature / Pain Crepitus: No Temperature: No Abnormality Excoriation: No Tenderness on Palpation: Yes Induration: No Rash: No Scarring: No Moisture No Abnormalities Noted: Yes Treatment Notes Wound #1 (Gluteus) Wound Laterality: Right Cleanser Wound Cleanser Discharge Instruction: Cleanse the wound with wound cleanser prior to applying a clean dressing using gauze sponges, not tissue or cotton balls. Peri-Wound Care Nystop Powder (Nystatin) Discharge Instruction: Apply Nystop (Nystatin) Powder as needed Topical CARRISA, ROA ([id number]) [id number] Page 8 of 10 Primary Dressing Promogran Prisma Matrix, 4.34 (sq in) (silver collagen) Discharge Instruction: Moisten collagen with saline or hydrogel VAC Secondary Dressing Duoderm Discharge Instruction: apply to periwound, under vac drape Secured With Compression Wrap Compression Stockings Add-Ons Electronic Signature(s) Signed: 02/14/2023 4:35:47 PM By: Dayton Scrape Entered By: Dayton Scrape on 02/14/2023 14:16:01 -------------------------------------------------------------------------------- Wound Assessment Details Patient Name: Date of Service: [name], [name] 02/14/2023 2:00 PM Medical Record Number: [id number] Patient Account Number: [id number] Date of Birth/Sex: Treating RN: June 05, 1951 (72 y.o. F) Primary Care Lennard Capek: [name] Other Clinician: Referring Ellajane Stong: Treating Montario Zilka/Extender: Greer Pickerel in Treatment: 22 Wound Status Wound Number: 3 Primary Pressure Ulcer Etiology: Wound Location: Right Calcaneus Wound Open Wounding Event: Pressure Injury Status: Date Acquired: 09/21/2022 Comorbid Chronic sinus problems/congestion, Sleep Apnea, Congestive Heart Weeks Of Treatment: 19 History: Failure, Raynauds, Rheumatoid Arthritis, Neuropathy Clustered Wound: No Photos Wound Measurements Length: (cm) 1.9 Width: (cm) 3.8 Depth: (cm) 1 Area: (cm) 5.671 Volume: (cm) 5.671 % Reduction in Area: 41.3% % Reduction in Volume: -487.1% Epithelialization: None Wound Description Classification: Category/Stage IV Wound Margin: Distinct, outline attached Exudate Amount: Medium Exudate Type: Serosanguineous Exudate Color: red, brown Foul Odor After Cleansing: No Slough/Fibrino Yes Wound Bed [name], [name] ([id number]) [id number] Page 9 of 10 Granulation Amount: Medium (34-66%) Exposed Structure Granulation Quality: Red, Pale, Hyper-granulation Fascia Exposed: No Necrotic Amount: Medium (34-66%) Fat Layer (Subcutaneous Tissue) Exposed: Yes Necrotic Quality: Adherent Slough Tendon Exposed: Yes Muscle Exposed: No Joint Exposed: No Bone Exposed: Yes Periwound Skin Texture Texture Color No Abnormalities Noted: Yes No Abnormalities Noted: Yes Moisture Temperature / Pain No Abnormalities Noted: No Temperature: No Abnormality Dry / Scaly: No Tenderness on Palpation: Yes Maceration: No Treatment Notes Wound #3 (Calcaneus) Wound Laterality: Right Cleanser Peri-Wound Care Zinc Oxide Ointment 30g tube Discharge Instruction: Apply Zinc Oxide to periwound with each  dressing change Topical Gentamicin Discharge Instruction: mix 50:50 with mupirocin and rub into wound bed Mupirocin Ointment Discharge Instruction: Apply Mupirocin (Bactroban) as instructed Primary Dressing Maxorb Extra Ag+ Alginate Dressing, 4x4.75 (in/in) Discharge Instruction: Apply to wound bed as instructed Secondary Dressing ALLEVYN Heel 4 1/2in x 5 1/2in / 10.5cm x 13.5cm Discharge Instruction: Apply over primary dressing as directed. Woven Gauze Sponge, Non-Sterile 4x4 in Discharge Instruction: Apply over primary dressing as directed. Secured With [employer] 4x5 (in/yd) Discharge Instruction: Secure with Coban as directed. Kerlix Roll Sterile, 4.5x3.1 (in/yd) Discharge Instruction: Secure with Kerlix as directed. Compression Wrap Compression Stockings Add-Ons Electronic Signature(s) Signed: 02/14/2023 4:35:47 PM By: Dayton Scrape Entered By: Dayton Scrape on 02/14/2023 14:15:23 -------------------------------------------------------------------------------- Vitals Details Patient Name: Date of Service: [name] [name]. 02/14/2023 2:00 PM Medical Record Number: [id number] Patient Account Number: [id number] Date of Birth/Sex: Treating RN: 02/06/1951 (72 y.o. F) Primary Care Keilani Terrance: [name] Other Clinician: Referring Emil Weigold: Treating Sharita Bienaime/Extender: Greer Pickerel in Treatment: [address] Larita Fife [name] ([id number]) [id number] Page 10 of 10 Vital Signs Time Taken: 02:04 Temperature (F): 99.1 Height (in): 62 Pulse (  bpm): 76 Weight (lbs): 207 Respiratory Rate (breaths/min): 20 Body Mass Index (BMI): 37.9 Blood Pressure (mmHg): 113/71 Reference Range: 80 - 120 mg / dl Electronic Signature(s) Signed: 02/14/2023 4:35:47 PM By: Dayton Scrape Entered By: Dayton Scrape on 02/14/2023 14:04:39

## 2023-02-14 NOTE — Progress Notes (Addendum)
[name] ([id number]) [id number] Page 1 of 12 Visit Report for 02/14/2023 Chief Complaint Document Details Patient Name: Date of Service: [name], [name] 02/14/2023 2:00 PM Medical Record Number: [id number] Patient Account Number: [id number] Date of Birth/Sex: Treating RN: 02/20/1951 (72 y.o. F) Primary Care Provider: Vincent Cooper Other Clinician: Referring Provider: Treating Provider/Extender: [name] in Treatment: 22 Information Obtained from: Patient Chief Complaint Patient is at the clinic for treatment of open pressure ulcers Electronic Signature(s) Signed: 02/14/2023 2:41:01 PM By: Marisa Guess MD FACS Entered By: [name] on 02/14/2023 14:41:00 -------------------------------------------------------------------------------- Debridement Details Patient Name: Date of Service: [name]. 02/14/2023 2:00 PM Medical Record Number: [id number] Patient Account Number: [id number] Date of Birth/Sex: Treating RN: [birth year] (72 y.o. [name] Primary Care Provider: Vincent Cooper Other Clinician: Referring Provider: Treating Provider/Extender: [name] in Treatment: 22 Debridement Performed for Assessment: Wound #1 Right Gluteus Performed By: Physician Marisa Guess, MD Debridement Type: Debridement Level of Consciousness (Pre-procedure): Awake and Alert Pre-procedure Verification/Time Out Yes - 14:20 Taken: Start Time: 14:25 Pain Control: Lidocaine 5% topical ointment Percent of Wound Bed Debrided: 100% T Area Debrided (cm): otal 6.87 Tissue and other material debrided: Non-Viable, Slough, Slough Level: Non-Viable Tissue Debridement Description: Selective/Open Wound Instrument: Curette Bleeding: Minimum Hemostasis Achieved: Pressure Response to Treatment: Procedure was tolerated well Level of Consciousness (Post- Awake and Alert procedure): Post  Debridement Measurements of Total Wound Length: (cm) 3.5 Stage: Category/Stage IV Width: (cm) 2.5 Depth: (cm) 10 Volume: (cm) 68.722 Character of Wound/Ulcer Post Debridement: Improved Post Procedure Diagnosis Same as [name], [name] ([id number]) [id number] Page 2 of 12 Notes Scribed for Dr Lady Gary by Marisa Pulling, RN Electronic Signature(s) Signed: 02/14/2023 4:27:51 PM By: Marisa Pulling RN, BSN Signed: 02/14/2023 4:41:42 PM By: Marisa Guess MD FACS Entered By: [name] on 02/14/2023 14:29:52 -------------------------------------------------------------------------------- Debridement Details Patient Name: Date of Service: [name] 02/14/2023 2:00 PM Medical Record Number: [id number] Patient Account Number: [id number] Date of Birth/Sex: Treating RN: [birth year] (72 y.o. [name] Primary Care Provider: Vincent Cooper Other Clinician: Referring Provider: Treating Provider/Extender: [name] in Treatment: 22 Debridement Performed for Assessment: Wound #3 Right Calcaneus Performed By: Physician Marisa Guess, MD Debridement Type: Debridement Level of Consciousness (Pre-procedure): Awake and Alert Pre-procedure Verification/Time Out Yes - 14:20 Taken: Start Time: 14:25 Pain Control: Lidocaine 5% topical ointment Percent of Wound Bed Debrided: 100% T Area Debrided (cm): otal 5.67 Tissue and other material debrided: Non-Viable, Slough, Slough Level: Non-Viable Tissue Debridement Description: Selective/Open Wound Instrument: Curette Bleeding: Minimum Hemostasis Achieved: Pressure Response to Treatment: Procedure was tolerated well Level of Consciousness (Post- Awake and Alert procedure): Post Debridement Measurements of Total Wound Length: (cm) 1.9 Stage: Category/Stage IV Width: (cm) 3.8 Depth: (cm) 1 Volume: (cm) 5.671 Character of Wound/Ulcer Post Debridement: Improved Post  Procedure Diagnosis Same as Pre-procedure Notes Scribed for Dr Lady Gary by Marisa Pulling, RN Electronic Signature(s) Signed: 02/14/2023 4:27:51 PM By: Marisa Pulling RN, BSN Signed: 02/14/2023 4:41:42 PM By: Marisa Guess MD FACS Entered By: [name] on 02/14/2023 14:30:54 -------------------------------------------------------------------------------- HPI Details Patient Name: Date of Service: Marisa Kathie Rhodes Cooper. 02/14/2023 2:00 PM Medical Record Number: [id number] Patient Account Number: [id number] [name], [name] ([id number]) [id number] Page 3 of 12 Date of Birth/Sex: Treating RN: 1950/11/23 (72 y.o. F) Primary Care Provider: Other Clinician: Vincent Cooper Referring Provider: Treating Provider/Extender: [name] in Treatment: 22 History of Present Illness HPI Description: ADMISSION  09/07/2023 This is a 72 year old woman with a past medical history notable for obesity, congestive heart failure, Raynaud's syndrome, CKD stage IIIb, osteoporosis, and rheumatoid arthritis. In November 2023, she suffered a fall that resulted in a femur fracture. She was hospitalized for about a week and underwent surgical repair of the fracture. She subsequently developed pressure ulcers on her right buttocks and ischium. She has been receiving home health services and they have been applying Medihoney. They have been reporting the wounds as stage II, but on evaluation, the large ulcer was probably unstageable at the time they were evaluating it but it is clearly a stage IV at this point. The smaller ulcer does have fat layer exposure and therefore is a stage III. The patient is accompanied by her daughter. She says she has been sleeping on a regular bed but recently ordered an air mattress T opper, but does not have it yet. She is on Ozempic for weight loss and therefore has a poor appetite and struggles to get adequate protein intake. 09/15/2022: The  large stage IV ulcer is substantially cleaner this week. The stage III ulcer is a little smaller. Both have slough accumulation. The culture that I took last week was polymicrobial. The Augmentin that I prescribed was adequate coverage for the species and she continues to take this. We have also ordered Keystone topical antibiotic compound, but this has not yet arrived. 09/21/2022: The stage IV ulcer has some necrotic muscle at the base but is otherwise fairly clean. The stage III ulcer is smaller again with light slough on the surface. She has her Keystone topical antibiotic compound with her today. 09/29/2022: There is still some necrotic muscle at the base of the stage IV ulcer that I was unable to get to last week. The stage III ulcer has healed. She unfortunately has developed a new pressure ulcer on her right heel. 10/06/2022: Still with some devitalized muscle at the base of the stage IV ulcer, but otherwise this wound is looking quite clean. The pressure induced tissue injury on her heel is demarcating and much of the area appears to be epithelialized, but there is still 2 areas that remain questionable. 10/12/2022: The stage IV ulcer is very clean and ready for wound VAC, which will be delivered tomorrow according to the patient. The tissue injury on her heel is drying up and does not feel particularly boggy this week. 10/19/2022: The heel injury continues to improve. There is still some dry eschar present on the more plantar aspect of it. There is some slough accumulation on the surface of the stage IV pressure ulcer. The wound VAC was initiated by home health last week. 11/01/2022: The gluteal pressure ulcer is very clean without any necrotic tissue or slough. The depth has come in by over half a centimeter. The deep tissue injury on her heel continues to improve. There is still dry eschar that we are painting with Betadine as well as some fresh-looking viable tissue at the margin. 11/15/2022: The  gluteal pressure ulcer is clean and contracting. The eschar on her heel ulcer is beginning to separate from the underlying tissue. 11/29/2022: The gluteal pressure ulcer is very clean. The depth has come in by about a centimeter. The eschar has completely separated off of her heel. There is some fibrinous exudate and slough on the heel. It has declared itself as a stage III at this point. 12/13/2022: The heel ulcer is smaller and much cleaner, but still has some non-viable tissue present. The  orifice of the gluteal ulcer is contracting, but the depth remains the same. Fortunately, the sponge for the [facility] is being packed appropriately into the full depth of the wound. 12/27/2022: The heel ulcer is smaller, but I think the depth and degree of tissue injury has finally declared itself, with tendon being exposed at the posterior aspect of the ulcer. It is now stage IV. The gluteal ulcer continues to contract circumferentially, but the depth is still unchanged. 01/10/2023: The heel ulcer has contracted further. There is still rubbery slough on the surface. There has been no further tissue breakdown; I think the last of the nonviable tissue was removed at her visit 2 weeks ago. The gluteal ulcer has contracted further circumferentially, but there is still a tunnel that ankles off cranially that remains about the same depth. The tissues appear viable at both sites. 01/24/2023: Unfortunately, the home health nurse that was applying the wound VAC did not put any drape on her skin for the bridge and was applying sponge directly to the patient's skin. This is resulted in significant tissue breakdown and pain for the patient. Once we were notified of this, we discontinued the wound VAC and they have been packing the wound with saline moistened gauze. The heel has also deteriorated. Bone is now exposed. 01/30/2023: Her skin looks significantly better this week. It has recovered from the insult caused by applying the sponge  directly to it. Her heel looks a little bit better as well. The culture that I took last week grew out group A strep, Proteus mirabilis, and Enterococcus, along with other skin flora. Although Levaquin was the recommended antibiotic, she is on amiodarone and therefore Levaquin is contraindicated. She has been taking Augmentin and is showing signs of improvement. 02/07/2023: The insult to her skin caused by direct application of wound VAC sponge has nearly completely healed. The gluteal ulcer cavity continues to contract circumferentially, but still probes quite deeply; I suspect the sponge for her VAC is not getting placed completely down into that tunnel. The heel is looking better today. There is more granulation filling in. I did not feel bone today. 02/14/2023: The gluteal ulcer cavity has circumferentially contracted even further, although the depth remains the same. The heel has improved and there is good tissue overlying where the bone had been exposed. Both wounds have some slough on the surface. Electronic Signature(s) Signed: 02/14/2023 2:41:52 PM By: Marisa Guess MD FACS Entered By: [name] on 02/14/2023 14:41:52 -------------------------------------------------------------------------------- Physical Exam Details Patient Name: Date of Service: [name], [name] 02/14/2023 2:00 PM [name] ([id number]) [id number] Page 4 of 12 Medical Record Number: [id number] Patient Account Number: [id number] Date of Birth/Sex: Treating RN: Jun 14, 1951 (72 y.o. F) Primary Care Provider: Vincent Cooper Other Clinician: Referring Provider: Treating Provider/Extender: [name] in Treatment: 22 Constitutional . . . . no acute distress. Respiratory Normal work of breathing on room air. Notes 02/14/2023: The gluteal ulcer cavity has circumferentially contracted even further, although the depth remains the same. The heel has improved  and there is good tissue overlying where the bone had been exposed. Both wounds have some slough on the surface. Electronic Signature(s) Signed: 02/14/2023 2:42:20 PM By: Marisa Guess MD FACS Entered By: [name] on 02/14/2023 14:42:20 -------------------------------------------------------------------------------- Physician Orders Details Patient Name: Date of Service: [name]. 02/14/2023 2:00 PM Medical Record Number: [id number] Patient Account Number: [id number] Date of Birth/Sex: Treating RN: 22-Aug-1950 (72 y.o. [name] Primary Care Provider: Hermenia Fiscal,  Heather Other Clinician: Referring Provider: Treating Provider/Extender: [name] in Treatment: 22 Verbal / Phone Orders: No Diagnosis Coding ICD-10 Coding Code Description L89.314 Pressure ulcer of right buttock, stage 4 L89.614 Pressure ulcer of right heel, stage 4 I73.00 Raynaud's syndrome without gangrene I50.32 Chronic diastolic (congestive) heart failure N18.32 Chronic kidney disease, stage 3b R73.02 Impaired glucose tolerance (oral) E66.9 Obesity, unspecified Follow-up Appointments ppointment in 1 week. - Dr. Lady Gary RM 1 Return A Wed 7/3 @ 2:00 pm Anesthetic Wound #1 Right Gluteus (In clinic) Topical Lidocaine 4% applied to wound bed Bathing/ Shower/ Hygiene May shower and wash wound with soap and water. Negative Presssure Wound Therapy Wound #1 Right Gluteus Wound Vac to wound continuously at 121mm/hg pressure - resume changes 3 times per week Black Foam Off-Loading Heel suspension boot to: - globoped right foot to ambulate Low air-loss mattress (Group 2) - Adapt Turn and reposition every 2 hours - avoid lying on back, stand at least every hour while out of bed, float heels off bed with pillows under calves while in bed Prevalon Boot - right foot esp. while in bed. LAKETHA, FU ([id number]) [id number] Page 5 of 12 Additional  Orders / Instructions Follow Nutritious Diet - increase protein to 70-80 gms per day, hold ozempic Juven Shake 1-2 times daily. Home Health New wound care orders this week; continue Home Health for wound care. May utilize formulary equivalent dressing for wound treatment orders unless otherwise specified. - resume VAC, bridge up toward back, prisma to base of wound then place black foam Duoderm to periwound under drape to protect skin Other Home Health Orders/Instructions: - [facility] Wound Treatment Wound #1 - Gluteus Wound Laterality: Right Cleanser: Wound Cleanser 3 x Per Week/30 Days Discharge Instructions: Cleanse the wound with wound cleanser prior to applying a clean dressing using gauze sponges, not tissue or cotton balls. Peri-Wound Care: Nystop Powder (Nystatin) 3 x Per Week/30 Days Discharge Instructions: Apply Nystop (Nystatin) Powder as needed Prim Dressing: Promogran Prisma Matrix, 4.34 (sq in) (silver collagen) 3 x Per Week/30 Days ary Discharge Instructions: Moisten collagen with saline or hydrogel Prim Dressing: VAC ary 3 x Per Week/30 Days Secondary Dressing: Duoderm 3 x Per Week/30 Days Discharge Instructions: apply to periwound, under vac drape Wound #3 - Calcaneus Wound Laterality: Right Peri-Wound Care: Zinc Oxide Ointment 30g tube 1 x Per Day/30 Days Discharge Instructions: Apply Zinc Oxide to periwound with each dressing change Topical: Gentamicin 1 x Per Day/30 Days Discharge Instructions: mix 50:50 with mupirocin and rub into wound bed Topical: Mupirocin Ointment 1 x Per Day/30 Days Discharge Instructions: Apply Mupirocin (Bactroban) as instructed Prim Dressing: Maxorb Extra Ag+ Alginate Dressing, 4x4.75 (in/in) 1 x Per Day/30 Days ary Discharge Instructions: Apply to wound bed as instructed Secondary Dressing: ALLEVYN Heel 4 1/2in x 5 1/2in / 10.5cm x 13.5cm 1 x Per Day/30 Days Discharge Instructions: Apply over primary dressing as directed. Secondary  Dressing: Woven Gauze Sponge, Non-Sterile 4x4 in 1 x Per Day/30 Days Discharge Instructions: Apply over primary dressing as directed. Secured With: Coban Self-Adherent Wrap 4x5 (in/yd) 1 x Per Day/30 Days Discharge Instructions: Secure with Coban as directed. Secured With: [employer], 4.5x3.1 (in/yd) 1 x Per Day/30 Days Discharge Instructions: Secure with Kerlix as directed. Patient Medications llergies: Sulfa (Sulfonamide Antibiotics), Cymbalta, Demerol, Iodinated Contrast Media, morphine, sulfasalazine, adhesive A Notifications Medication Indication Start End 02/14/2023 lidocaine DOSE topical 5 % ointment - ointment topical once daily Electronic Signature(s) Signed: 02/14/2023 4:27:51 PM By: Rubye Oaks,  Lyla Son RN, BSN Signed: 02/14/2023 4:41:42 PM By: Marisa Guess MD FACS Entered By: [name] on 02/14/2023 15:15:23 Problem List Details -------------------------------------------------------------------------------- [name] ([id number]) [id number] Page 6 of 12 Patient Name: Date of Service: [name], [name] 02/14/2023 2:00 PM Medical Record Number: [id number] Patient Account Number: [id number] Date of Birth/Sex: Treating RN: July 22, 1951 (72 y.o. F) Primary Care Provider: Vincent Cooper Other Clinician: Referring Provider: Treating Provider/Extender: [name] in Treatment: 22 Active Problems ICD-10 Encounter Code Description Active Date MDM Diagnosis L89.314 Pressure ulcer of right buttock, stage 4 09/07/2022 No Yes L89.614 Pressure ulcer of right heel, stage 4 09/28/2022 No Yes I73.00 Raynaud's syndrome without gangrene 09/07/2022 No Yes I50.32 Chronic diastolic (congestive) heart failure 09/07/2022 No Yes N18.32 Chronic kidney disease, stage 3b 09/07/2022 No Yes R73.02 Impaired glucose tolerance (oral) 09/07/2022 No Yes E66.9 Obesity, unspecified 09/07/2022 No Yes Inactive Problems ICD-10 Code Description  Active Date Inactive Date L89.313 Pressure ulcer of right buttock, stage 3 09/07/2022 09/07/2022 Resolved Problems Electronic Signature(s) Signed: 02/14/2023 2:40:49 PM By: Marisa Guess MD FACS Entered By: [name] on 02/14/2023 14:40:49 -------------------------------------------------------------------------------- Progress Note Details Patient Name: Date of Service: Marisa Franne Grip. 02/14/2023 2:00 PM Medical Record Number: [id number] Patient Account Number: [id number] Date of Birth/Sex: Treating RN: 1951-04-18 (72 y.o. F) Primary Care Provider: Vincent Cooper Other Clinician: Referring Provider: Treating Provider/Extender: [name] in Treatment: [address] [name], [name] ([id number]) [id number] Page 7 of 12 Chief Complaint Information obtained from Patient Patient is at the clinic for treatment of open pressure ulcers History of Present Illness (HPI) ADMISSION 09/07/2023 This is a 72 year old woman with a past medical history notable for obesity, congestive heart failure, Raynaud's syndrome, CKD stage IIIb, osteoporosis, and rheumatoid arthritis. In November 2023, she suffered a fall that resulted in a femur fracture. She was hospitalized for about a week and underwent surgical repair of the fracture. She subsequently developed pressure ulcers on her right buttocks and ischium. She has been receiving home health services and they have been applying Medihoney. They have been reporting the wounds as stage II, but on evaluation, the large ulcer was probably unstageable at the time they were evaluating it but it is clearly a stage IV at this point. The smaller ulcer does have fat layer exposure and therefore is a stage III. The patient is accompanied by her daughter. She says she has been sleeping on a regular bed but recently ordered an air mattress T opper, but does not have it yet. She is on Ozempic for weight loss  and therefore has a poor appetite and struggles to get adequate protein intake. 09/15/2022: The large stage IV ulcer is substantially cleaner this week. The stage III ulcer is a little smaller. Both have slough accumulation. The culture that I took last week was polymicrobial. The Augmentin that I prescribed was adequate coverage for the species and she continues to take this. We have also ordered Keystone topical antibiotic compound, but this has not yet arrived. 09/21/2022: The stage IV ulcer has some necrotic muscle at the base but is otherwise fairly clean. The stage III ulcer is smaller again with light slough on the surface. She has her Keystone topical antibiotic compound with her today. 09/29/2022: There is still some necrotic muscle at the base of the stage IV ulcer that I was unable to get to last week. The stage III ulcer has healed. She unfortunately has developed a new pressure ulcer on her right heel.  10/06/2022: Still with some devitalized muscle at the base of the stage IV ulcer, but otherwise this wound is looking quite clean. The pressure induced tissue injury on her heel is demarcating and much of the area appears to be epithelialized, but there is still 2 areas that remain questionable. 10/12/2022: The stage IV ulcer is very clean and ready for wound VAC, which will be delivered tomorrow according to the patient. The tissue injury on her heel is drying up and does not feel particularly boggy this week. 10/19/2022: The heel injury continues to improve. There is still some dry eschar present on the more plantar aspect of it. There is some slough accumulation on the surface of the stage IV pressure ulcer. The wound VAC was initiated by home health last week. 11/01/2022: The gluteal pressure ulcer is very clean without any necrotic tissue or slough. The depth has come in by over half a centimeter. The deep tissue injury on her heel continues to improve. There is still dry eschar that we are  painting with Betadine as well as some fresh-looking viable tissue at the margin. 11/15/2022: The gluteal pressure ulcer is clean and contracting. The eschar on her heel ulcer is beginning to separate from the underlying tissue. 11/29/2022: The gluteal pressure ulcer is very clean. The depth has come in by about a centimeter. The eschar has completely separated off of her heel. There is some fibrinous exudate and slough on the heel. It has declared itself as a stage III at this point. 12/13/2022: The heel ulcer is smaller and much cleaner, but still has some non-viable tissue present. The orifice of the gluteal ulcer is contracting, but the depth remains the same. Fortunately, the sponge for the [facility] is being packed appropriately into the full depth of the wound. 12/27/2022: The heel ulcer is smaller, but I think the depth and degree of tissue injury has finally declared itself, with tendon being exposed at the posterior aspect of the ulcer. It is now stage IV. The gluteal ulcer continues to contract circumferentially, but the depth is still unchanged. 01/10/2023: The heel ulcer has contracted further. There is still rubbery slough on the surface. There has been no further tissue breakdown; I think the last of the nonviable tissue was removed at her visit 2 weeks ago. The gluteal ulcer has contracted further circumferentially, but there is still a tunnel that ankles off cranially that remains about the same depth. The tissues appear viable at both sites. 01/24/2023: Unfortunately, the home health nurse that was applying the wound VAC did not put any drape on her skin for the bridge and was applying sponge directly to the patient's skin. This is resulted in significant tissue breakdown and pain for the patient. Once we were notified of this, we discontinued the wound VAC and they have been packing the wound with saline moistened gauze. The heel has also deteriorated. Bone is now exposed. 01/30/2023: Her skin  looks significantly better this week. It has recovered from the insult caused by applying the sponge directly to it. Her heel looks a little bit better as well. The culture that I took last week grew out group A strep, Proteus mirabilis, and Enterococcus, along with other skin flora. Although Levaquin was the recommended antibiotic, she is on amiodarone and therefore Levaquin is contraindicated. She has been taking Augmentin and is showing signs of improvement. 02/07/2023: The insult to her skin caused by direct application of wound VAC sponge has nearly completely healed. The gluteal ulcer cavity  continues to contract circumferentially, but still probes quite deeply; I suspect the sponge for her VAC is not getting placed completely down into that tunnel. The heel is looking better today. There is more granulation filling in. I did not feel bone today. 02/14/2023: The gluteal ulcer cavity has circumferentially contracted even further, although the depth remains the same. The heel has improved and there is good tissue overlying where the bone had been exposed. Both wounds have some slough on the surface. Patient History Information obtained from Patient, Caregiver, Chart. Family History Cancer - Father,Paternal Grandparents, Heart Disease - Mother,Father,Paternal Grandparents, Hypertension - Mother, Stroke - Maternal Grandparents, No family history of Diabetes, Hereditary Spherocytosis, Kidney Disease, Lung Disease, Seizures, Thyroid Problems, Tuberculosis. 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 Medical History Eyes Denies history of Cataracts, Glaucoma, Optic Neuritis Ear/Nose/Mouth/Throat Patient has history of Chronic sinus problems/congestion - chronic rhinitis Denies history of Middle ear problems Respiratory Patient has history of Sleep Apnea - uses CPAP Cardiovascular Patient has  history of Congestive Heart Failure Endocrine [name], [name] ([id number]) [id number] Page 8 of 12 Denies history of Type I Diabetes, Type II Diabetes Genitourinary Denies history of End Stage Renal Disease Immunological Patient has history of Raynauds Denies history of Lupus Erythematosus, Scleroderma Integumentary (Skin) Denies history of History of Burn Musculoskeletal Patient has history of Rheumatoid Arthritis Denies history of Gout, Osteoarthritis, Osteomyelitis Neurologic Patient has history of Neuropathy Denies history of Dementia, Quadriplegia, Paraplegia, Seizure Disorder Oncologic Denies history of Received Chemotherapy, Received Radiation Psychiatric Denies history of Anorexia/bulimia, Confinement Anxiety Hospitalization/Surgery History - right femur fx ORIR. - right knee replacement. - left breast mass excision. - left ankle tibia repair. - abdominal hysterectomy. - adnoidectomy/tonsillectomy. - cholecystectomy. - dental implants. - patoid cystectomy. - tubal ligation. Medical A Surgical History Notes nd Constitutional Symptoms (General Health) morbid obesity Cardiovascular hyperlipidemia Gastrointestinal GERD, IBS, eosinophilic esophagitis, diverticulosis, Genitourinary CKD stage 3 Immunological Sjoegren syndrome, fibromyalgia Objective Constitutional no acute distress. Vitals Time Taken: 2:04 AM, Height: 62 in, Weight: 207 lbs, BMI: 37.9, Temperature: 99.1 F, Pulse: 76 bpm, Respiratory Rate: 20 breaths/min, Blood Pressure: 113/71 mmHg. Respiratory Normal work of breathing on room air. General Notes: 02/14/2023: The gluteal ulcer cavity has circumferentially contracted even further, although the depth remains the same. The heel has improved and there is good tissue overlying where the bone had been exposed. Both wounds have some slough on the surface. Integumentary (Hair, Skin) Wound #1 status is Open. Original cause of wound was  Pressure Injury. The date acquired was: 08/11/2022. The wound has been in treatment 22 weeks. The wound is located on the Right Gluteus. The wound measures 3.5cm length x 2.5cm width x 10cm depth; 6.872cm^2 area and 68.722cm^3 volume. There is Fat Layer (Subcutaneous Tissue) exposed. There is no tunneling or undermining noted. There is a large amount of serosanguineous drainage noted. The wound margin is well defined and not attached to the wound base. There is large (67-100%) red granulation within the wound bed. There is no necrotic tissue within the wound bed. The periwound skin appearance had no abnormalities noted for moisture. The periwound skin appearance had no abnormalities noted for color. The periwound skin appearance did not exhibit: Callus, Crepitus, Excoriation, Induration, Rash, Scarring. Periwound temperature was noted as No Abnormality. The periwound has tenderness on palpation. Wound #3 status is Open. Original cause of wound was Pressure Injury. The date  acquired was: 09/21/2022. The wound has been in treatment 19 weeks. The wound is located on the Right Calcaneus. The wound measures 1.9cm length x 3.8cm width x 1cm depth; 5.671cm^2 area and 5.671cm^3 volume. There is bone, tendon, and Fat Layer (Subcutaneous Tissue) exposed. There is a medium amount of serosanguineous drainage noted. The wound margin is distinct with the outline attached to the wound base. There is medium (34-66%) red, pale, hyper - granulation within the wound bed. There is a medium (34-66%) amount of necrotic tissue within the wound bed including Adherent Slough. The periwound skin appearance had no abnormalities noted for texture. The periwound skin appearance had no abnormalities noted for color. The periwound skin appearance did not exhibit: Dry/Scaly, Maceration. Periwound temperature was noted as No Abnormality. The periwound has tenderness on palpation. 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 There was a Selective/Open Wound Non-Viable Tissue Debridement with a total area of 6.87 sq cm performed by Marisa Guess, MD. With the following instrument(s): Curette to remove Non-Viable tissue/material. Material removed includes [facility] after achieving pain control using Lidocaine 5% topical ointment. No specimens were taken. A time out was conducted at 14:20, prior to the start of the procedure. A Minimum amount of bleeding was controlled with Pressure. The procedure was tolerated well. Post Debridement Measurements: 3.5cm length x 2.5cm width x 10cm depth; 68.722cm^3 volume. Post debridement Stage noted as Category/Stage IV. Character of Wound/Ulcer Post Debridement is improved. Post procedure Diagnosis Wound #1: Same as Pre-Procedure General Notes: Scribed for Dr Lady Gary by Marisa Pulling, RN. Wound #3 Pre-procedure diagnosis of Wound #3 is a Pressure Ulcer located on the Right Calcaneus . There was a Selective/Open Wound Non-Viable Tissue Debridement with a total area of 5.67 sq cm performed by Marisa Guess, MD. With the following instrument(s): Curette to remove Non-Viable tissue/material. Material removed includes [facility] after achieving pain control using Lidocaine 5% topical ointment. No specimens were taken. A time out was conducted at 14:20, prior to the start of the procedure. A Minimum amount of bleeding was controlled with Pressure. The procedure was tolerated well. Post Debridement Measurements: 1.9cm length x 3.8cm width x 1cm depth; 5.671cm^3 volume. Post  debridement Stage noted as Category/Stage IV. Character of Wound/Ulcer Post Debridement is improved. Post procedure Diagnosis Wound #3: Same as Pre-Procedure General Notes: Scribed for Dr Lady Gary by Marisa Pulling, RN. Plan Follow-up Appointments: Return Appointment in 1 week. - Dr. Lady Gary RM 1 Wed 7/3 @ 2:00 pm Anesthetic: Wound #1 Right Gluteus: (In clinic) Topical Lidocaine 4% applied to wound bed Bathing/ Shower/ Hygiene: May shower and wash wound with soap and water. Negative Presssure Wound Therapy: Wound #1 Right Gluteus: Wound Vac to wound continuously at 151mm/hg pressure - resume changes 3 times per week Black Foam Off-Loading: Heel suspension boot to: - globoped right foot to ambulate Low air-loss mattress (Group 2) - Adapt Turn and reposition every 2 hours - avoid lying on back, stand at least every hour while out of bed, float heels off bed with pillows under calves while in bed Prevalon Boot - right foot esp. while in bed. Additional Orders / Instructions: Follow Nutritious Diet - increase protein to  70-80 gms per day, hold ozempic Juven Shake 1-2 times daily. Home Health: New wound care orders this week; continue Home Health for wound care. May utilize formulary equivalent dressing for wound treatment orders unless otherwise specified. - resume VAC, bridge up toward back, prisma to base of wound then place black foam Duoderm to periwound under drape to protect skin Other Home Health Orders/Instructions: - [facility] The following medication(s) was prescribed: lidocaine topical 5 % ointment ointment topical once daily was prescribed at facility WOUND #1: - Gluteus Wound Laterality: Right Cleanser: Wound Cleanser 3 x Per Week/30 Days Discharge Instructions: Cleanse the wound with wound cleanser prior to applying a clean dressing using gauze sponges, not tissue or cotton balls. Peri-Wound Care: Nystop Powder (Nystatin) 3 x Per Week/30 Days Discharge Instructions: Apply  Nystop (Nystatin) Powder as needed Prim Dressing: Promogran Prisma Matrix, 4.34 (sq in) (silver collagen) 3 x Per Week/30 Days ary Discharge Instructions: Moisten collagen with saline or hydrogel Prim Dressing: VAC 3 x Per Week/30 Days ary Secondary Dressing: Duoderm 3 x Per Week/30 Days Discharge Instructions: apply to periwound, under vac drape WOUND #3: - Calcaneus Wound Laterality: Right Peri-Wound Care: Zinc Oxide Ointment 30g tube 1 x Per Day/30 Days Discharge Instructions: Apply Zinc Oxide to periwound with each dressing change Topical: Gentamicin 1 x Per Day/30 Days Discharge Instructions: mix 50:50 with mupirocin and rub into wound bed Topical: Mupirocin Ointment 1 x Per Day/30 Days Discharge Instructions: Apply Mupirocin (Bactroban) as instructed Prim Dressing: Maxorb Extra Ag+ Alginate Dressing, 4x4.75 (in/in) 1 x Per Day/30 Days ary Discharge Instructions: Apply to wound bed as instructed Secondary Dressing: ALLEVYN Heel 4 1/2in x 5 1/2in / 10.5cm x 13.5cm 1 x Per Day/30 Days Discharge Instructions: Apply over primary dressing as directed. Secondary Dressing: Woven Gauze Sponge, Non-Sterile 4x4 in 1 x Per Day/30 Days Discharge Instructions: Apply over primary dressing as directed. Secured With: Coban Self-Adherent Wrap 4x5 (in/yd) 1 x Per Day/30 Days Discharge Instructions: Secure with Coban as directed. Secured With: [employer], 4.5x3.1 (in/yd) 1 x Per Day/30 Days Discharge Instructions: Secure with Kerlix as directed. [name], [name] ([id number]) [id number] Page 10 of 12 02/14/2023: The gluteal ulcer cavity has circumferentially contracted even further, although the depth remains the same. The heel has improved and there is good tissue overlying where the bone had been exposed. Both wounds have some slough on the surface. I used a curette to debride slough from both wounds. We will continue to use negative pressure wound therapy on the  gluteal ulcer, placing Prisma silver collagen at the depths of the wound bed. Continue topical gentamicin and mupirocin on the heel with silver alginate. Continue to adequately offload both sites. Continue to ensure 60 to 100 g of protein intake daily. She will follow-up in 1 week. Electronic Signature(s) Signed: 02/16/2023 3:32:15 PM By: Shawn Stall RN, BSN Signed: 02/16/2023 3:32:56 PM By: Marisa Guess MD FACS Previous Signature: 02/14/2023 2:44:03 PM Version By: Marisa Guess MD FACS Entered By: Shawn Stall on 02/16/2023 15:30:42 -------------------------------------------------------------------------------- HxROS Details Patient Name: Date of Service: Marisa Kathie Rhodes Cooper. 02/14/2023 2:00 PM Medical Record Number: [id number] Patient Account Number: [id number] Date of Birth/Sex: Treating RN: 1950/09/21 (72 y.o. F) Primary Care Provider: Vincent Cooper Other Clinician: Referring Provider: Treating Provider/Extender: [name] in Treatment: 22 Information Obtained From Patient Caregiver Chart Constitutional Symptoms (General Health) Medical History: Past Medical History Notes: morbid obesity Eyes Medical History: Negative for: Cataracts; Glaucoma; Optic Neuritis Ear/Nose/Mouth/Throat Medical History: Positive for:  Chronic sinus problems/congestion - chronic rhinitis Negative for: Middle ear problems Respiratory Medical History: Positive for: Sleep Apnea - uses CPAP Cardiovascular Medical History: Positive for: Congestive Heart Failure Past Medical History Notes: hyperlipidemia Gastrointestinal Medical History: Past Medical History Notes: GERD, IBS, eosinophilic esophagitis, diverticulosis, Endocrine Medical History: Negative for: Type I Diabetes; Type II Diabetes Genitourinary Medical HistoryYOANNA, TIMPONE ([id number]) [id number] Page 11 of 12 Negative for: End Stage Renal Disease Past Medical History  Notes: CKD stage 3 Immunological Medical History: Positive for: Raynauds Negative for: Lupus Erythematosus; Scleroderma Past Medical History Notes: Sjoegren syndrome, fibromyalgia Integumentary (Skin) Medical History: Negative for: History of Burn Musculoskeletal Medical History: Positive for: Rheumatoid Arthritis Negative for: Gout; Osteoarthritis; Osteomyelitis Neurologic Medical History: Positive for: Neuropathy Negative for: Dementia; Quadriplegia; Paraplegia; Seizure Disorder Oncologic Medical History: Negative for: Received Chemotherapy; Received Radiation Psychiatric Medical History: Negative for: Anorexia/bulimia; Confinement Anxiety HBO Extended History Items Ear/Nose/Mouth/Throat: Chronic sinus problems/congestion Immunizations Pneumococcal Vaccine: Received Pneumococcal Vaccination: Yes Received Pneumococcal Vaccination On or After 60th Birthday: Yes Implantable Devices None Hospitalization / Surgery History Type of Hospitalization/Surgery right femur fx ORIR right knee replacement left breast mass excision left ankle tibia repair abdominal hysterectomy adnoidectomy/tonsillectomy cholecystectomy dental implants patoid cystectomy tubal ligation Family and Social History Cancer: Yes - Father,Paternal Grandparents; Diabetes: No; Heart Disease: Yes - Mother,Father,Paternal Grandparents; Hereditary Spherocytosis: No; Hypertension: Yes - Mother; Kidney Disease: No; Lung Disease: No; Seizures: No; Stroke: Yes - Maternal Grandparents; Thyroid Problems: No; Tuberculosis: No; Never smoker; Marital Status - Widowed; Alcohol Use: Rarely; Drug Use: Prior History - TCH; Caffeine Use: Daily - soda, tea; Financial Concerns: No; Food, Clothing or Shelter Needs: No; Support System Lacking: No; Transportation Concerns: No Electronic Signature(s) Signed: 02/14/2023 4:41:42 PM By: Marisa Guess MD FACS Entered By: [name] on 02/14/2023 14:41:58 [name]  ([id number]) [id number] Page 12 of 12 -------------------------------------------------------------------------------- SuperBill Details Patient Name: Date of Service: [name], [name] 02/14/2023 Medical Record Number: [id number] Patient Account Number: [id number] Date of Birth/Sex: Treating RN: [birth year] (72 y.o. F) Primary Care Provider: Vincent Cooper Other Clinician: Referring Provider: Treating Provider/Extender: [name] in Treatment: 22 Diagnosis Coding ICD-10 Codes Code Description L89.314 Pressure ulcer of right buttock, stage 4 L89.614 Pressure ulcer of right heel, stage 4 I73.00 Raynaud's syndrome without gangrene I50.32 Chronic diastolic (congestive) heart failure N18.32 Chronic kidney disease, stage 3b R73.02 Impaired glucose tolerance (oral) E66.9 Obesity, unspecified Facility Procedures : CPT4 Code: [id number] Description: 97597 - DEBRIDE WOUND 1ST 20 SQ CM OR < ICD-10 Diagnosis Description L89.314 Pressure ulcer of right buttock, stage 4 L89.614 Pressure ulcer of right heel, stage 4 Modifier: Quantity: 1 Physician Procedures : CPT4 Code Description Modifier [id number] - WC PHYS LEVEL 4 - EST PT 25 ICD-10 Diagnosis Description L89.314 Pressure ulcer of right buttock, stage 4 L89.614 Pressure ulcer of right heel, stage 4 I50.32 Chronic diastolic (congestive) heart failure  I73.00 Raynaud's syndrome without gangrene Quantity: 1 : [id number] - WC PHYS DEBR WO ANESTH 20 SQ CM ICD-10 Diagnosis Description L89.314 Pressure ulcer of right buttock, stage 4 L89.614 Pressure ulcer of right heel, stage 4 Quantity: 1 Electronic Signature(s) Signed: 02/14/2023 2:44:23 PM By: Marisa Guess MD FACS Entered By: [name] on 02/14/2023 14:44:23

## 2023-02-21 ENCOUNTER — Encounter (HOSPITAL_BASED_OUTPATIENT_CLINIC_OR_DEPARTMENT_OTHER): Payer: PPO | Admitting: General Surgery

## 2023-02-21 ENCOUNTER — Other Ambulatory Visit: Payer: Self-pay | Admitting: Nurse Practitioner

## 2023-02-21 DIAGNOSIS — M810 Age-related osteoporosis without current pathological fracture: Secondary | ICD-10-CM

## 2023-02-21 DIAGNOSIS — L89614 Pressure ulcer of right heel, stage 4: Secondary | ICD-10-CM | POA: Diagnosis not present

## 2023-02-22 NOTE — Progress Notes (Signed)
Marisa, Cooper ([id number]) [id number] Page 1 of 12 Visit Report for 02/21/2023 Chief Complaint Document Details Patient Name: Date of Service: [name], [name] 02/21/2023 2:00 PM Medical Record Number: [id number] Patient Account Number: [id number] Date of Birth/Sex: Treating RN: [birth year] (72 y.o. F) Primary Care Provider: Vincent Gros Other Clinician: Referring Provider: Treating Provider/Extender: Greer Pickerel in Treatment: 23 Information Obtained from: Patient Chief Complaint Patient is at the clinic for treatment of open pressure ulcers Electronic Signature(s) Signed: 02/21/2023 2:56:51 PM By: Duanne Guess MD FACS Entered By: Duanne Guess on 02/21/2023 14:56:51 -------------------------------------------------------------------------------- Debridement Details Patient Name: Date of Service: [name] 02/21/2023 2:00 PM Medical Record Number: [id number] Patient Account Number: [id number] Date of Birth/Sex: Treating RN: 10/05/1950 (72 y.o. [name] Primary Care Provider: Vincent Gros Other Clinician: Referring Provider: Treating Provider/Extender: Greer Pickerel in Treatment: 23 Debridement Performed for Assessment: Wound #3 Right Calcaneus Performed By: Physician Duanne Guess, MD Debridement Type: Debridement Level of Consciousness (Pre-procedure): Awake and Alert Pre-procedure Verification/Time Out Yes - 14:45 Taken: Start Time: 14:47 Pain Control: Lidocaine 4% T opical Solution Percent of Wound Bed Debrided: 50% T Area Debrided (cm): otal 2.6 Tissue and other material debrided: Non-Viable, Slough, Slough Level: Non-Viable Tissue Debridement Description: Selective/Open Wound Instrument: Curette Bleeding: Minimum Hemostasis Achieved: Pressure Procedural Pain: 0 Post Procedural Pain: 0 Response to Treatment: Procedure was tolerated well Level of  Consciousness (Post- Awake and Alert procedure): Post Debridement Measurements of Total Wound Length: (cm) 1.7 Stage: Category/Stage IV Width: (cm) 3.9 Depth: (cm) 1 Volume: (cm) 5.207 Character of Wound/Ulcer Post Debridement: Improved Post Procedure Diagnosis KALECIA, HARTNEY ([id number]) [id number] Page 2 of 12 Same as Pre-procedure Notes scribed for Dr. Lady Gary by Zenaida Deed, RN Electronic Signature(s) Signed: 02/21/2023 3:01:56 PM By: Duanne Guess MD FACS Signed: 02/21/2023 5:46:42 PM By: Zenaida Deed RN, BSN Entered By: Zenaida Deed on 02/21/2023 14:51:25 -------------------------------------------------------------------------------- HPI Details Patient Name: Date of Service: Marisa Roux E. 02/21/2023 2:00 PM Medical Record Number: [id number] Patient Account Number: [id number] Date of Birth/Sex: Treating RN: May 09, 1951 (72 y.o. F) Primary Care Provider: Vincent Gros Other Clinician: Referring Provider: Treating Provider/Extender: Greer Pickerel in Treatment: 23 History of Present Illness HPI Description: ADMISSION 09/07/2023 This is a 72 year old woman with a past medical history notable for obesity, congestive heart failure, Raynaud's syndrome, CKD stage IIIb, osteoporosis, and rheumatoid arthritis. In November 2023, she suffered a fall that resulted in a femur fracture. She was hospitalized for about a week and underwent surgical repair of the fracture. She subsequently developed pressure ulcers on her right buttocks and ischium. She has been receiving home health services and they have been applying Medihoney. They have been reporting the wounds as stage II, but on evaluation, the large ulcer was probably unstageable at the time they were evaluating it but it is clearly a stage IV at this point. The smaller ulcer does have fat layer exposure and therefore is a stage III. The patient is accompanied by her  daughter. She says she has been sleeping on a regular bed but recently ordered an air mattress T opper, but does not have it yet. She is on Ozempic for weight loss and therefore has a poor appetite and struggles to get adequate protein intake. 09/15/2022: The large stage IV ulcer is substantially cleaner this week. The stage III ulcer is a little smaller. Both have slough accumulation. The culture that I took last week was polymicrobial. The Augmentin that  I prescribed was adequate coverage for the species and she continues to take this. We have also ordered Keystone topical antibiotic compound, but this has not yet arrived. 09/21/2022: The stage IV ulcer has some necrotic muscle at the base but is otherwise fairly clean. The stage III ulcer is smaller again with light slough on the surface. She has her Keystone topical antibiotic compound with her today. 09/29/2022: There is still some necrotic muscle at the base of the stage IV ulcer that I was unable to get to last week. The stage III ulcer has healed. She unfortunately has developed a new pressure ulcer on her right heel. 10/06/2022: Still with some devitalized muscle at the base of the stage IV ulcer, but otherwise this wound is looking quite clean. The pressure induced tissue injury on her heel is demarcating and much of the area appears to be epithelialized, but there is still 2 areas that remain questionable. 10/12/2022: The stage IV ulcer is very clean and ready for wound VAC, which will be delivered tomorrow according to the patient. The tissue injury on her heel is drying up and does not feel particularly boggy this week. 10/19/2022: The heel injury continues to improve. There is still some dry eschar present on the more plantar aspect of it. There is some slough accumulation on the surface of the stage IV pressure ulcer. The wound VAC was initiated by home health last week. 11/01/2022: The gluteal pressure ulcer is very clean without any necrotic  tissue or slough. The depth has come in by over half a centimeter. The deep tissue injury on her heel continues to improve. There is still dry eschar that we are painting with Betadine as well as some fresh-looking viable tissue at the margin. 11/15/2022: The gluteal pressure ulcer is clean and contracting. The eschar on her heel ulcer is beginning to separate from the underlying tissue. 11/29/2022: The gluteal pressure ulcer is very clean. The depth has come in by about a centimeter. The eschar has completely separated off of her heel. There is some fibrinous exudate and slough on the heel. It has declared itself as a stage III at this point. 12/13/2022: The heel ulcer is smaller and much cleaner, but still has some non-viable tissue present. The orifice of the gluteal ulcer is contracting, but the depth remains the same. Fortunately, the sponge for the [facility] is being packed appropriately into the full depth of the wound. 12/27/2022: The heel ulcer is smaller, but I think the depth and degree of tissue injury has finally declared itself, with tendon being exposed at the posterior aspect of the ulcer. It is now stage IV. The gluteal ulcer continues to contract circumferentially, but the depth is still unchanged. 01/10/2023: The heel ulcer has contracted further. There is still rubbery slough on the surface. There has been no further tissue breakdown; I think the last of the nonviable tissue was removed at her visit 2 weeks ago. The gluteal ulcer has contracted further circumferentially, but there is still a tunnel that ankles off cranially that remains about the same depth. The tissues appear viable at both sites. 01/24/2023: Unfortunately, the home health nurse that was applying the wound VAC did not put any drape on her skin for the bridge and was applying sponge directly to the patient's skin. This is resulted in significant tissue breakdown and pain for the patient. Once we were notified of this, we  discontinued the wound VAC and they have been packing the wound with saline moistened  gauze. The heel has also deteriorated. Bone is now exposed. 01/30/2023: Her skin looks significantly better this week. It has recovered from the insult caused by applying the sponge directly to it. Her heel looks a little bit better as well. The culture that I took last week grew out group A strep, Proteus mirabilis, and Enterococcus, along with other skin flora. Although Levaquin was the recommended antibiotic, she is on amiodarone and therefore Levaquin is contraindicated. She has been taking Augmentin and is showing signs of EILLEEN, DAVOLI ([id number]) [phone].pdf Page 3 of 12 improvement. 02/07/2023: The insult to her skin caused by direct application of wound VAC sponge has nearly completely healed. The gluteal ulcer cavity continues to contract circumferentially, but still probes quite deeply; I suspect the sponge for her VAC is not getting placed completely down into that tunnel. The heel is looking better today. There is more granulation filling in. I did not feel bone today. 02/14/2023: The gluteal ulcer cavity has circumferentially contracted even further, although the depth remains the same. The heel has improved and there is good tissue overlying where the bone had been exposed. Both wounds have some slough on the surface. 02/21/2023: The gluteal ulcer cavity continues to contract and is about half a centimeter shallower this week. The surface is clean without any slough or other debris accumulation. The heel continues to fill in granulation tissue over where the bone had been exposed. There is some slough on the wound surface. Electronic Signature(s) Signed: 02/21/2023 2:57:35 PM By: Duanne Guess MD FACS Entered By: Duanne Guess on 02/21/2023 14:57:35 -------------------------------------------------------------------------------- Physical Exam Details Patient Name: Date  of Service: [name] 02/21/2023 2:00 PM Medical Record Number: [id number] Patient Account Number: [id number] Date of Birth/Sex: Treating RN: 04/28/1951 (72 y.o. F) Primary Care Provider: Vincent Gros Other Clinician: Referring Provider: Treating Provider/Extender: Greer Pickerel in Treatment: 23 Constitutional Hypertensive, asymptomatic. . . . no acute distress. Respiratory Normal work of breathing on room air. Notes 02/21/2023: The gluteal ulcer cavity continues to contract and is about half a centimeter shallower this week. The surface is clean without any slough or other debris accumulation. The heel continues to fill in granulation tissue over where the bone had been exposed. There is some slough on the wound surface. Electronic Signature(s) Signed: 02/21/2023 2:59:16 PM By: Duanne Guess MD FACS Entered By: Duanne Guess on 02/21/2023 14:59:16 -------------------------------------------------------------------------------- Physician Orders Details Patient Name: Date of Service: [name] 02/21/2023 2:00 PM Medical Record Number: [id number] Patient Account Number: [id number] Date of Birth/Sex: Treating RN: February 14, 1951 (72 y.o. [name] Primary Care Provider: Vincent Gros Other Clinician: Referring Provider: Treating Provider/Extender: Greer Pickerel in Treatment: 66 Verbal / Phone Orders: No Diagnosis Coding ICD-10 Coding Code Description L89.314 Pressure ulcer of right buttock, stage 4 L89.614 Pressure ulcer of right heel, stage 4 I73.00 Raynaud's syndrome without gangrene I50.32 Chronic diastolic (congestive) heart failure N18.32 Chronic kidney disease, stage 3b JHADE, BERKO ([id number]) [id number] Page 4 of 12 R73.02 Impaired glucose tolerance (oral) E66.9 Obesity, unspecified Follow-up Appointments ppointment in 1 week. - Dr. Lady Gary RM 3 Return A Wed 7/24  @ 11:00 am Anesthetic Wound #1 Right Gluteus (In clinic) Topical Lidocaine 4% applied to wound bed Bathing/ Shower/ Hygiene May shower and wash wound with soap and water. Negative Presssure Wound Therapy Wound #1 Right Gluteus Wound Vac to wound continuously at 169mm/hg pressure - resume changes 3 times per week Black Foam Off-Loading Heel suspension boot  to: - globoped right foot to ambulate Low air-loss mattress (Group 2) - Adapt Turn and reposition every 2 hours - avoid lying on back, stand at least every hour while out of bed, float heels off bed with pillows under calves while in bed Prevalon Boot - right foot esp. while in bed. Additional Orders / Instructions Follow Nutritious Diet - increase protein to 70-80 gms per day, hold ozempic Juven Shake 1-2 times daily. Home Health New wound care orders this week; continue Home Health for wound care. May utilize formulary equivalent dressing for wound treatment orders unless otherwise specified. - resume VAC, bridge up toward back, prisma to base of wound then place black foam Duoderm to periwound under drape to protect skin Other Home Health Orders/Instructions: - [facility] Wound Treatment Wound #1 - Gluteus Wound Laterality: Right Cleanser: Wound Cleanser 3 x Per Week/30 Days Discharge Instructions: Cleanse the wound with wound cleanser prior to applying a clean dressing using gauze sponges, not tissue or cotton balls. Prim Dressing: Promogran Prisma Matrix, 4.34 (sq in) (silver collagen) 3 x Per Week/30 Days ary Discharge Instructions: Moisten collagen with saline or hydrogel Prim Dressing: VAC ary 3 x Per Week/30 Days Secondary Dressing: Duoderm 3 x Per Week/30 Days Discharge Instructions: apply to periwound, under vac drape Wound #3 - Calcaneus Wound Laterality: Right Peri-Wound Care: Zinc Oxide Ointment 30g tube 1 x Per Day/30 Days Discharge Instructions: Apply Zinc Oxide to periwound with each dressing change Topical:  Gentamicin 1 x Per Day/30 Days Discharge Instructions: mix 50:50 with mupirocin and rub into wound bed Topical: Mupirocin Ointment 1 x Per Day/30 Days Discharge Instructions: Apply Mupirocin (Bactroban) as instructed Prim Dressing: Maxorb Extra Ag+ Alginate Dressing, 4x4.75 (in/in) 1 x Per Day/30 Days ary Discharge Instructions: Apply to wound bed as instructed Secondary Dressing: ALLEVYN Heel 4 1/2in x 5 1/2in / 10.5cm x 13.5cm 1 x Per Day/30 Days Discharge Instructions: Apply over primary dressing as directed. Secondary Dressing: Woven Gauze Sponge, Non-Sterile 4x4 in 1 x Per Day/30 Days Discharge Instructions: Apply over primary dressing as directed. Secured With: Coban Self-Adherent Wrap 4x5 (in/yd) 1 x Per Day/30 Days Discharge Instructions: Secure with Coban as directed. Secured With: [employer], 4.5x3.1 (in/yd) 1 x Per Day/30 Days Discharge Instructions: Secure with Kerlix as directed. DANELLA, PHILSON ([id number]) [id number] Page 5 of 12 Electronic Signature(s) Signed: 02/21/2023 3:01:56 PM By: Duanne Guess MD FACS Entered By: Duanne Guess on 02/21/2023 14:59:43 Prescription 02/21/2023 -------------------------------------------------------------------------------- Darnelle Catalan MD Patient Name: Provider: Sep 16, 1950 [id number] Date of Birth: [place] Sex: [name] #: [phone] Phone #: License #: UPN: Patient Address: [address] Eligha Bridegroom [facility] [place], [place] [address] [address] [place], [place] [address] [phone] Allergies Sulfa (Sulfonamide Antibiotics); Cymbalta; Demerol; Iodinated Contrast Media; morphine; sulfasalazine; adhesive Provider's Orders ppointment in 1 week. - Dr. Lady Gary RM 3 Wed 7/24 @ 11:00 am Return A Hand Signature: Date(s): Electronic Signature(s) Signed: 02/21/2023 3:01:56 PM By: Duanne Guess MD  FACS Entered By: Duanne Guess on 02/21/2023 14:59:44 -------------------------------------------------------------------------------- Problem List Details Patient Name: Date of Service: [name] 02/21/2023 2:00 PM Medical Record Number: [id number] Patient Account Number: [id number] Date of Birth/Sex: Treating RN: Dec 22, 1950 (72 y.o. [name] Primary Care Provider: Vincent Gros Other Clinician: Referring Provider: Treating Provider/Extender: Greer Pickerel in Treatment: 23 Active Problems ICD-10 Encounter Code Description Active Date MDM Diagnosis L89.314 Pressure ulcer of right buttock, stage 4  09/07/2022 No Yes L89.614 Pressure ulcer of right heel, stage 4 09/28/2022 No Yes I73.00 Raynaud's syndrome without gangrene 09/07/2022 No Yes PRARTHANA, PARLIN ([id number]) [phone].pdf Page 6 of 12 I50.32 Chronic diastolic (congestive) heart failure 09/07/2022 No Yes N18.32 Chronic kidney disease, stage 3b 09/07/2022 No Yes R73.02 Impaired glucose tolerance (oral) 09/07/2022 No Yes E66.9 Obesity, unspecified 09/07/2022 No Yes Inactive Problems ICD-10 Code Description Active Date Inactive Date L89.313 Pressure ulcer of right buttock, stage 3 09/07/2022 09/07/2022 Resolved Problems Electronic Signature(s) Signed: 02/21/2023 2:56:35 PM By: Duanne Guess MD FACS Entered By: Duanne Guess on 02/21/2023 14:56:35 -------------------------------------------------------------------------------- Progress Note Details Patient Name: Date of Service: [name] 02/21/2023 2:00 PM Medical Record Number: [id number] Patient Account Number: [id number] Date of Birth/Sex: Treating RN: 1950-09-12 (72 y.o. F) Primary Care Provider: Vincent Gros Other Clinician: Referring Provider: Treating Provider/Extender: Greer Pickerel in Treatment: 23 Subjective Chief Complaint Information obtained from  Patient Patient is at the clinic for treatment of open pressure ulcers History of Present Illness (HPI) ADMISSION 09/07/2023 This is a 72 year old woman with a past medical history notable for obesity, congestive heart failure, Raynaud's syndrome, CKD stage IIIb, osteoporosis, and rheumatoid arthritis. In November 2023, she suffered a fall that resulted in a femur fracture. She was hospitalized for about a week and underwent surgical repair of the fracture. She subsequently developed pressure ulcers on her right buttocks and ischium. She has been receiving home health services and they have been applying Medihoney. They have been reporting the wounds as stage II, but on evaluation, the large ulcer was probably unstageable at the time they were evaluating it but it is clearly a stage IV at this point. The smaller ulcer does have fat layer exposure and therefore is a stage III. The patient is accompanied by her daughter. She says she has been sleeping on a regular bed but recently ordered an air mattress T opper, but does not have it yet. She is on Ozempic for weight loss and therefore has a poor appetite and struggles to get adequate protein intake. 09/15/2022: The large stage IV ulcer is substantially cleaner this week. The stage III ulcer is a little smaller. Both have slough accumulation. The culture that I took last week was polymicrobial. The Augmentin that I prescribed was adequate coverage for the species and she continues to take this. We have also ordered Keystone topical antibiotic compound, but this has not yet arrived. 09/21/2022: The stage IV ulcer has some necrotic muscle at the base but is otherwise fairly clean. The stage III ulcer is smaller again with light slough on the surface. She has her Keystone topical antibiotic compound with her today. 09/29/2022: There is still some necrotic muscle at the base of the stage IV ulcer that I was unable to get to last week. The stage III ulcer has  healed. She unfortunately has developed a new pressure ulcer on her right heel. 10/06/2022: Still with some devitalized muscle at the base of the stage IV ulcer, but otherwise this wound is looking quite clean. The pressure induced tissue injury on her heel is demarcating and much of the area appears to be epithelialized, but there is still 2 areas that remain questionable. BRIUNNA, LEICHT ([id number]) [id number] Page 7 of 12 10/12/2022: The stage IV ulcer is very clean and ready for wound VAC, which will be delivered tomorrow according to the patient. The tissue injury on her heel is drying up and does not feel particularly boggy this week.  10/19/2022: The heel injury continues to improve. There is still some dry eschar present on the more plantar aspect of it. There is some slough accumulation on the surface of the stage IV pressure ulcer. The wound VAC was initiated by home health last week. 11/01/2022: The gluteal pressure ulcer is very clean without any necrotic tissue or slough. The depth has come in by over half a centimeter. The deep tissue injury on her heel continues to improve. There is still dry eschar that we are painting with Betadine as well as some fresh-looking viable tissue at the margin. 11/15/2022: The gluteal pressure ulcer is clean and contracting. The eschar on her heel ulcer is beginning to separate from the underlying tissue. 11/29/2022: The gluteal pressure ulcer is very clean. The depth has come in by about a centimeter. The eschar has completely separated off of her heel. There is some fibrinous exudate and slough on the heel. It has declared itself as a stage III at this point. 12/13/2022: The heel ulcer is smaller and much cleaner, but still has some non-viable tissue present. The orifice of the gluteal ulcer is contracting, but the depth remains the same. Fortunately, the sponge for the [facility] is being packed appropriately into the full depth of the  wound. 12/27/2022: The heel ulcer is smaller, but I think the depth and degree of tissue injury has finally declared itself, with tendon being exposed at the posterior aspect of the ulcer. It is now stage IV. The gluteal ulcer continues to contract circumferentially, but the depth is still unchanged. 01/10/2023: The heel ulcer has contracted further. There is still rubbery slough on the surface. There has been no further tissue breakdown; I think the last of the nonviable tissue was removed at her visit 2 weeks ago. The gluteal ulcer has contracted further circumferentially, but there is still a tunnel that ankles off cranially that remains about the same depth. The tissues appear viable at both sites. 01/24/2023: Unfortunately, the home health nurse that was applying the wound VAC did not put any drape on her skin for the bridge and was applying sponge directly to the patient's skin. This is resulted in significant tissue breakdown and pain for the patient. Once we were notified of this, we discontinued the wound VAC and they have been packing the wound with saline moistened gauze. The heel has also deteriorated. Bone is now exposed. 01/30/2023: Her skin looks significantly better this week. It has recovered from the insult caused by applying the sponge directly to it. Her heel looks a little bit better as well. The culture that I took last week grew out group A strep, Proteus mirabilis, and Enterococcus, along with other skin flora. Although Levaquin was the recommended antibiotic, she is on amiodarone and therefore Levaquin is contraindicated. She has been taking Augmentin and is showing signs of improvement. 02/07/2023: The insult to her skin caused by direct application of wound VAC sponge has nearly completely healed. The gluteal ulcer cavity continues to contract circumferentially, but still probes quite deeply; I suspect the sponge for her VAC is not getting placed completely down into that tunnel.  The heel is looking better today. There is more granulation filling in. I did not feel bone today. 02/14/2023: The gluteal ulcer cavity has circumferentially contracted even further, although the depth remains the same. The heel has improved and there is good tissue overlying where the bone had been exposed. Both wounds have some slough on the surface. 02/21/2023: The gluteal ulcer cavity continues  to contract and is about half a centimeter shallower this week. The surface is clean without any slough or other debris accumulation. The heel continues to fill in granulation tissue over where the bone had been exposed. There is some slough on the wound surface. Patient History Information obtained from Patient, Caregiver, Chart. Family History Cancer - Father,Paternal Grandparents, Heart Disease - Mother,Father,Paternal Grandparents, Hypertension - Mother, Stroke - Maternal Grandparents, No family history of Diabetes, Hereditary Spherocytosis, Kidney Disease, Lung Disease, Seizures, Thyroid Problems, Tuberculosis. 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 Medical History Eyes Denies history of Cataracts, Glaucoma, Optic Neuritis Ear/Nose/Mouth/Throat Patient has history of Chronic sinus problems/congestion - chronic rhinitis Denies history of Middle ear problems Respiratory Patient has history of Sleep Apnea - uses CPAP Cardiovascular Patient has history of Congestive Heart Failure Endocrine Denies history of Type I Diabetes, Type II Diabetes Genitourinary Denies history of End Stage Renal Disease Immunological Patient has history of Raynauds Denies history of Lupus Erythematosus, Scleroderma Integumentary (Skin) Denies history of History of Burn Musculoskeletal Patient has history of Rheumatoid Arthritis Denies history of Gout, Osteoarthritis, Osteomyelitis Neurologic Patient has history  of Neuropathy Denies history of Dementia, Quadriplegia, Paraplegia, Seizure Disorder Oncologic Denies history of Received Chemotherapy, Received Radiation Psychiatric Denies history of Anorexia/bulimia, Confinement Anxiety Hospitalization/Surgery History - right femur fx ORIR. - right knee replacement. - left breast mass excision. - left ankle tibia repair. - abdominal hysterectomy. - adnoidectomy/tonsillectomy. - cholecystectomy. - dental implants. - patoid cystectomy. - tubal ligation. Medical A Surgical History Notes nd Constitutional Symptoms (General Health) morbid obesity Cardiovascular hyperlipidemia CASSONDRA, STACHOWSKI ([id number]) [phone].pdf Page 8 of 12 Gastrointestinal GERD, IBS, eosinophilic esophagitis, diverticulosis, Genitourinary CKD stage 3 Immunological Sjoegren syndrome, fibromyalgia Objective Constitutional Hypertensive, asymptomatic. no acute distress. Vitals Time Taken: 2:21 PM, Height: 62 in, Weight: 207 lbs, BMI: 37.9, Temperature: 98 F, Pulse: 69 bpm, Respiratory Rate: 20 breaths/min, Blood Pressure: 150/73 mmHg. Respiratory Normal work of breathing on room air. General Notes: 02/21/2023: The gluteal ulcer cavity continues to contract and is about half a centimeter shallower this week. The surface is clean without any slough or other debris accumulation. The heel continues to fill in granulation tissue over where the bone had been exposed. There is some slough on the wound surface. Integumentary (Hair, Skin) Wound #1 status is Open. Original cause of wound was Pressure Injury. The date acquired was: 08/11/2022. The wound has been in treatment 23 weeks. The wound is located on the Right Gluteus. The wound measures 1.5cm length x 3cm width x 9.5cm depth; 3.534cm^2 area and 33.576cm^3 volume. There is Fat Layer (Subcutaneous Tissue) exposed. There is no tunneling or undermining noted. There is a large amount of serosanguineous drainage  noted. The wound margin is well defined and not attached to the wound base. There is large (67-100%) red granulation within the wound bed. There is no necrotic tissue within the wound bed. The periwound skin appearance had no abnormalities noted for moisture. The periwound skin appearance had no abnormalities noted for color. The periwound skin appearance did not exhibit: Callus, Crepitus, Excoriation, Induration, Rash, Scarring. Periwound temperature was noted as No Abnormality. The periwound has tenderness on palpation. Wound #3 status is Open. Original cause of wound was Pressure Injury. The date acquired was: 09/21/2022. The wound has been in treatment 20 weeks. The wound is located on the Right Calcaneus. The wound measures 1.7cm length x  3.9cm width x 1cm depth; 5.207cm^2 area and 5.207cm^3 volume. There is bone and Fat Layer (Subcutaneous Tissue) exposed. There is no tunneling or undermining noted. There is a medium amount of serosanguineous drainage noted. The wound margin is distinct with the outline attached to the wound base. There is medium (34-66%) red, pale granulation within the wound bed. There is a medium (34-66%) amount of necrotic tissue within the wound bed including Adherent Slough. The periwound skin appearance had no abnormalities noted for texture. The periwound skin appearance had no abnormalities noted for color. The periwound skin appearance did not exhibit: Dry/Scaly, Maceration. Periwound temperature was noted as No Abnormality. The periwound has tenderness on palpation. 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 There was a  Selective/Open Wound Non-Viable Tissue Debridement with a total area of 2.6 sq cm performed by Duanne Guess, MD. With the following instrument(s): Curette to remove Non-Viable tissue/material. Material removed includes [facility] after achieving pain control using Lidocaine 4% Topical Solution. No specimens were taken. A time out was conducted at 14:45, prior to the start of the procedure. A Minimum amount of bleeding was controlled with Pressure. The procedure was tolerated well with a pain level of 0 throughout and a pain level of 0 following the procedure. Post Debridement Measurements: 1.7cm length x 3.9cm width x 1cm depth; 5.207cm^3 volume. Post debridement Stage noted as Category/Stage IV. Character of Wound/Ulcer Post Debridement is improved. Post procedure Diagnosis Wound #3: Same as Pre-Procedure General Notes: scribed for Dr. Lady Gary by Zenaida Deed, RN. CANDIE, GINTZ ([id number]) [id number] Page 9 of 12 Plan Follow-up Appointments: Return Appointment in 1 week. - Dr. Lady Gary RM 3 Wed 7/24 @ 11:00 am Anesthetic: Wound #1 Right Gluteus: (In clinic) Topical Lidocaine 4% applied to wound bed Bathing/ Shower/ Hygiene: May shower and wash wound with soap and water. Negative Presssure Wound Therapy: Wound #1 Right Gluteus: Wound Vac to wound continuously at 175mm/hg pressure - resume changes 3 times per week Black Foam Off-Loading: Heel suspension boot to: - globoped right foot to ambulate Low air-loss mattress (Group 2) - Adapt Turn and reposition every 2 hours - avoid lying on back, stand at least every hour while out of bed, float heels off bed with pillows under calves while in bed Prevalon Boot - right foot esp. while in bed. Additional Orders / Instructions: Follow Nutritious Diet - increase protein to 70-80 gms per day, hold ozempic Juven Shake 1-2 times daily. Home Health: New wound care orders this week; continue Home Health for wound care.  May utilize formulary equivalent dressing for wound treatment orders unless otherwise specified. - resume VAC, bridge up toward back, prisma to base of wound then place black foam Duoderm to periwound under drape to protect skin Other Home Health Orders/Instructions: - [facility] WOUND #1: - Gluteus Wound Laterality: Right Cleanser: Wound Cleanser 3 x Per Week/30 Days Discharge Instructions: Cleanse the wound with wound cleanser prior to applying a clean dressing using gauze sponges, not tissue or cotton balls. Prim Dressing: Promogran Prisma Matrix, 4.34 (sq in) (silver collagen) 3 x Per Week/30 Days ary Discharge Instructions: Moisten collagen with saline or hydrogel Prim Dressing: VAC 3 x Per Week/30 Days ary Secondary Dressing: Duoderm 3 x  Per Week/30 Days Discharge Instructions: apply to periwound, under vac drape WOUND #3: - Calcaneus Wound Laterality: Right Peri-Wound Care: Zinc Oxide Ointment 30g tube 1 x Per Day/30 Days Discharge Instructions: Apply Zinc Oxide to periwound with each dressing change Topical: Gentamicin 1 x Per Day/30 Days Discharge Instructions: mix 50:50 with mupirocin and rub into wound bed Topical: Mupirocin Ointment 1 x Per Day/30 Days Discharge Instructions: Apply Mupirocin (Bactroban) as instructed Prim Dressing: Maxorb Extra Ag+ Alginate Dressing, 4x4.75 (in/in) 1 x Per Day/30 Days ary Discharge Instructions: Apply to wound bed as instructed Secondary Dressing: ALLEVYN Heel 4 1/2in x 5 1/2in / 10.5cm x 13.5cm 1 x Per Day/30 Days Discharge Instructions: Apply over primary dressing as directed. Secondary Dressing: Woven Gauze Sponge, Non-Sterile 4x4 in 1 x Per Day/30 Days Discharge Instructions: Apply over primary dressing as directed. Secured With: Coban Self-Adherent Wrap 4x5 (in/yd) 1 x Per Day/30 Days Discharge Instructions: Secure with Coban as directed. Secured With: [employer], 4.5x3.1 (in/yd) 1 x Per Day/30 Days Discharge Instructions:  Secure with Kerlix as directed. 02/21/2023: The gluteal ulcer cavity continues to contract and is about half a centimeter shallower this week. The surface is clean without any slough or other debris accumulation. The heel continues to fill in granulation tissue over where the bone had been exposed. There is some slough on the wound surface. The gluteal ulcer did not require any debridement. We will continue to place Prisma silver collagen in the base and continue negative pressure wound therapy. I debrided slough from the heel ulcer. We will continue the mixture of topical gentamicin and mupirocin with silver alginate and a foam heel cup. I think we can extend her visits to every other week at this point, so I will see her 2 weeks from today. Electronic Signature(s) Signed: 02/21/2023 3:00:33 PM By: Duanne Guess MD FACS Entered By: Duanne Guess on 02/21/2023 15:00:32 -------------------------------------------------------------------------------- HxROS Details Patient Name: Date of Service: [place] Kathie Rhodes E. 02/21/2023 2:00 PM Medical Record Number: [id number] Patient Account Number: [id number] Date of Birth/Sex: Treating RN: [birth year] (72 y.o. F) Primary Care Provider: Vincent Gros Other Clinician: Referring Provider: Treating Provider/Extender: Greer Pickerel in Treatment: [address] Larita Fife E ([id number]) [id number] Page 10 of 12 Information Obtained From Patient Caregiver Chart Constitutional Symptoms (General Health) Medical History: Past Medical History Notes: morbid obesity Eyes Medical History: Negative for: Cataracts; Glaucoma; Optic Neuritis Ear/Nose/Mouth/Throat Medical History: Positive for: Chronic sinus problems/congestion - chronic rhinitis Negative for: Middle ear problems Respiratory Medical History: Positive for: Sleep Apnea - uses CPAP Cardiovascular Medical History: Positive for: Congestive Heart  Failure Past Medical History Notes: hyperlipidemia Gastrointestinal Medical History: Past Medical History Notes: GERD, IBS, eosinophilic esophagitis, diverticulosis, Endocrine Medical History: Negative for: Type I Diabetes; Type II Diabetes Genitourinary Medical History: Negative for: End Stage Renal Disease Past Medical History Notes: CKD stage 3 Immunological Medical History: Positive for: Raynauds Negative for: Lupus Erythematosus; Scleroderma Past Medical History Notes: Sjoegren syndrome, fibromyalgia Integumentary (Skin) Medical History: Negative for: History of Burn Musculoskeletal Medical History: Positive for: Rheumatoid Arthritis Negative for: Gout; Osteoarthritis; Osteomyelitis Neurologic Medical History: Positive for: Neuropathy Negative for: Dementia; Quadriplegia; Paraplegia; Seizure Disorder Oncologic Medical History: Negative for: Received Chemotherapy; Received Radiation SAMANATHA, BRAMMER ([id number]) [phone].pdf Page 11 of 12 Psychiatric Medical History: Negative for: Anorexia/bulimia; Confinement Anxiety HBO Extended History Items Ear/Nose/Mouth/Throat: Chronic sinus problems/congestion Immunizations Pneumococcal Vaccine: Received Pneumococcal Vaccination: Yes Received Pneumococcal Vaccination On or After 60th Birthday: Yes Implantable Devices None Hospitalization / Surgery History  Type of Hospitalization/Surgery right femur fx ORIR right knee replacement left breast mass excision left ankle tibia repair abdominal hysterectomy adnoidectomy/tonsillectomy cholecystectomy dental implants patoid cystectomy tubal ligation Family and Social History Cancer: Yes - Father,Paternal Grandparents; Diabetes: No; Heart Disease: Yes - Mother,Father,Paternal Grandparents; Hereditary Spherocytosis: No; Hypertension: Yes - Mother; Kidney Disease: No; Lung Disease: No; Seizures: No; Stroke: Yes - Maternal Grandparents; Thyroid  Problems: No; Tuberculosis: No; Never smoker; Marital Status - Widowed; Alcohol Use: Rarely; Drug Use: Prior History - TCH; Caffeine Use: Daily - soda, tea; Financial Concerns: No; Food, Clothing or Shelter Needs: No; Support System Lacking: No; Transportation Concerns: No Electronic Signature(s) Signed: 02/21/2023 3:01:56 PM By: Duanne Guess MD FACS Entered By: Duanne Guess on 02/21/2023 14:58:52 -------------------------------------------------------------------------------- SuperBill Details Patient Name: Date of Service: [name] 02/21/2023 Medical Record Number: [id number] Patient Account Number: [id number] Date of Birth/Sex: Treating RN: 12-28-1950 (72 y.o. F) Primary Care Provider: Vincent Gros Other Clinician: Referring Provider: Treating Provider/Extender: Greer Pickerel in Treatment: 23 Diagnosis Coding ICD-10 Codes Code Description L89.314 Pressure ulcer of right buttock, stage 4 L89.614 Pressure ulcer of right heel, stage 4 I73.00 Raynaud's syndrome without gangrene I50.32 Chronic diastolic (congestive) heart failure N18.32 Chronic kidney disease, stage 3b R73.02 Impaired glucose tolerance (oral) E66.9 Obesity, unspecified Facility Procedures : DAVAN, HARK CodeDavid Stall ([id number]) [id number] I Description: [id number] - DEBRIDE WOUND 1ST 20 SQ CM OR < CD-10 Diagnosis Description L89.614 Pressure ulcer of right heel, stage 4 Modifier: [email or web address] 1 Quantity: f Page 12 of 12 : CPT4 Code: [id number] Description: 7605 - WOUND VAC-50 SQ CM OR LESS 59 Modifier: 1 Quantity: Physician Procedures : CPT4 Code Description Modifier [id number] - WC PHYS LEVEL 4 - EST PT 25 ICD-10 Diagnosis Description L89.314 Pressure ulcer of right buttock, stage 4 L89.614 Pressure ulcer of right heel, stage 4 I50.32 Chronic diastolic (congestive) heart failure  I73.00 Raynaud's syndrome without gangrene Quantity: 1 : [id number]  [id number] - WC PHYS DEBR WO ANESTH 20 SQ CM ICD-10 Diagnosis Description L89.614 Pressure ulcer of right heel, stage 4 Quantity: 1 Electronic Signature(s) Signed: 02/21/2023 5:46:42 PM By: Zenaida Deed RN, BSN Signed: 02/22/2023 7:57:56 AM By: Duanne Guess MD FACS Previous Signature: 02/21/2023 3:00:56 PM Version By: Duanne Guess MD FACS Entered By: Zenaida Deed on 02/21/2023 17:15:26

## 2023-02-22 NOTE — Progress Notes (Signed)
DANECIA, UNDERDOWN ([id number]) [id number] Page 1 of 10 Visit Report for 02/21/2023 Arrival Information Details Patient Name: Date of Service: [name], [name] 02/21/2023 2:00 PM Medical Record Number: [id number] Patient Account Number: [id number] Date of Birth/Sex: Treating RN: 1951/02/23 (72 y.o. [name] Primary Care Jailine Lieder: Vincent Gros Other Clinician: Referring Sarai January: Treating Marchetta Navratil/Extender: Greer Pickerel in Treatment: 23 Visit Information History Since Last Visit Added or deleted any medications: No Patient Arrived: Walker Any new allergies or adverse reactions: No Arrival Time: 14:18 Had a fall or experienced change in No Accompanied By: self activities of daily living that may affect Transfer Assistance: None risk of falls: Patient Identification Verified: Yes Signs or symptoms of abuse/neglect since last visito No Secondary Verification Process Completed: Yes Hospitalized since last visit: No Patient Requires Transmission-Based Precautions: No Implantable device outside of the clinic excluding No Patient Has Alerts: No cellular tissue based products placed in the center since last visit: Has Dressing in Place as Prescribed: Yes Has Compression in Place as Prescribed: Yes Pain Present Now: Yes Electronic Signature(s) Signed: 02/21/2023 5:46:42 PM By: Zenaida Deed RN, BSN Entered By: Zenaida Deed on 02/21/2023 14:21:32 -------------------------------------------------------------------------------- Encounter Discharge Information Details Patient Name: Date of Service: Marisa Score. 02/21/2023 2:00 PM Medical Record Number: [id number] Patient Account Number: [id number] Date of Birth/Sex: Treating RN: [birth year] (72 y.o. [name] Primary Care Natacha Jepsen: Vincent Gros Other Clinician: Referring Dennisha Mouser: Treating Corgan Mormile/Extender: Greer Pickerel in  Treatment: 23 Encounter Discharge Information Items Post Procedure Vitals Discharge Condition: Stable Temperature (F): 98.4 Ambulatory Status: Walker Pulse (bpm): 69 Discharge Destination: Home Respiratory Rate (breaths/min): 20 Transportation: Private Auto Blood Pressure (mmHg): 150/73 Accompanied By: son Schedule Follow-up Appointment: Yes Clinical Summary of Care: Patient Declined Electronic Signature(s) Signed: 02/21/2023 5:46:42 PM By: Zenaida Deed RN, BSN Entered By: Zenaida Deed on 02/21/2023 17:16:32 [name] ([id number]) [id number] Page 2 of 10 -------------------------------------------------------------------------------- Lower Extremity Assessment Details Patient Name: Date of Service: [name], [name] 02/21/2023 2:00 PM Medical Record Number: [id number] Patient Account Number: [id number] Date of Birth/Sex: Treating RN: 10-14-1950 (72 y.o. [name] Primary Care Javed Cotto: Vincent Gros Other Clinician: Referring De Libman: Treating Romie Tay/Extender: Greer Pickerel in Treatment: 23 Edema Assessment Assessed: [Left: No] [Right: No] Edema: [Left: Ye] [Right: s] Calf Left: Right: Point of Measurement: From Medial Instep 39 cm Ankle Left: Right: Point of Measurement: From Medial Instep 22.5 cm Vascular Assessment Pulses: Dorsalis Pedis Palpable: [Right:Yes] Extremity colors, hair growth, and conditions: Extremity Color: [Right:Normal] Hair Growth on Extremity: [Right:Yes] Temperature of Extremity: [Right:Warm < 3 seconds] Electronic Signature(s) Signed: 02/21/2023 5:46:42 PM By: Zenaida Deed RN, BSN Entered By: Zenaida Deed on 02/21/2023 14:30:25 -------------------------------------------------------------------------------- Multi Wound Chart Details Patient Name: Date of Service: Marisa Score. 02/21/2023 2:00 PM Medical Record Number: [id number] Patient Account Number:  [id number] Date of Birth/Sex: Treating RN: [birth year] (72 y.o. F) Primary Care Stryker Veasey: Vincent Gros Other Clinician: Referring Raffaela Ladley: Treating Adrijana Haros/Extender: Greer Pickerel in Treatment: 23 Vital Signs Height(in): 62 Pulse(bpm): 69 Weight(lbs): 207 Blood Pressure(mmHg): 150/73 Body Mass Index(BMI): 37.9 Temperature(F): 98 Respiratory Rate(breaths/min): 20 [1:Photos:] [N/A:N/A [id number] Page 3 of 10] Right Gluteus Right Calcaneus N/A Wound Location: Pressure Injury Pressure Injury N/A Wounding Event: Pressure Ulcer Pressure Ulcer N/A Primary Etiology: Chronic sinus problems/congestion, Chronic sinus problems/congestion, N/A Comorbid History: Sleep Apnea, Congestive Heart Sleep Apnea, Congestive Heart Failure, Raynauds, Rheumatoid Failure, Raynauds, Rheumatoid Arthritis, Neuropathy Arthritis, Neuropathy 08/11/2022 09/21/2022 N/A Date Acquired:  23 20 N/A Weeks of Treatment: Open Open N/A Wound Status: No No N/A Wound Recurrence: 1.5x3x9.5 1.7x3.9x1 N/A Measurements L x W x D (cm) 3.534 5.207 N/A A (cm) : rea 33.576 5.207 N/A Volume (cm) : 83.90% 46.10% N/A % Reduction in A rea: 74.60% -439.00% N/A % Reduction in Volume: Category/Stage IV Category/Stage IV N/A Classification: Large Medium N/A Exudate A mount: Serosanguineous Serosanguineous N/A Exudate Type: red, brown red, brown N/A Exudate Color: Well defined, not attached Distinct, outline attached N/A Wound Margin: Large (67-100%) Medium (34-66%) N/A Granulation A mount: Red Red, Pale N/A Granulation Quality: None Present (0%) Medium (34-66%) N/A Necrotic A mount: Fat Layer (Subcutaneous Tissue): Yes Fat Layer (Subcutaneous Tissue): Yes N/A Exposed Structures: Fascia: No Bone: Yes Tendon: No Fascia: No Muscle: No Tendon: No Joint: No Muscle: No Bone: No Joint: No Small (1-33%) None N/A Epithelialization: N/A Debridement -  Selective/Open Wound N/A Debridement: Pre-procedure Verification/Time Out N/A 14:45 N/A Taken: N/A Lidocaine 4% Topical Solution N/A Pain Control: N/A Slough N/A Tissue Debrided: N/A Non-Viable Tissue N/A Level: N/A 2.6 N/A Debridement A (sq cm): rea N/A Curette N/A Instrument: N/A Minimum N/A Bleeding: N/A Pressure N/A Hemostasis A chieved: N/A 0 N/A Procedural Pain: N/A 0 N/A Post Procedural Pain: N/A Procedure was tolerated well N/A Debridement Treatment Response: N/A 1.7x3.9x1 N/A Post Debridement Measurements L x W x D (cm) N/A 5.207 N/A Post Debridement Volume: (cm) N/A Category/Stage IV N/A Post Debridement Stage: Excoriation: No No Abnormalities Noted N/A Periwound Skin Texture: Induration: No Callus: No Crepitus: No Rash: No Scarring: No Maceration: No Maceration: No N/A Periwound Skin Moisture: Dry/Scaly: No Dry/Scaly: No Atrophie Blanche: No No Abnormalities Noted N/A Periwound Skin Color: Cyanosis: No Ecchymosis: No Erythema: No Hemosiderin Staining: No Mottled: No Pallor: No Rubor: No No Abnormality No Abnormality N/A Temperature: Yes Yes N/A Tenderness on Palpation: Negative Pressure Wound Therapy Debridement N/A Procedures Performed: Maintenance (NPWT) Treatment Notes Electronic Signature(s) Signed: 02/21/2023 2:56:42 PM By: Duanne Guess MD FACS Entered By: Duanne Guess on 02/21/2023 14:56:42 [name] ([id number]) [id number] Page 4 of 10 -------------------------------------------------------------------------------- Multi-Disciplinary Care Plan Details Patient Name: Date of Service: KYMBERLIE, BRAZEAU 02/21/2023 2:00 PM Medical Record Number: [id number] Patient Account Number: [id number] Date of Birth/Sex: Treating RN: 12-31-1950 (72 y.o. [name] Primary Care Osiris Charles: Vincent Gros Other Clinician: Referring Renai Lopata: Treating Kaziyah Parkison/Extender: Greer Pickerel in Treatment: 23 Multidisciplinary Care Plan reviewed with physician Active Inactive Pressure Nursing Diagnoses: Knowledge deficit related to management of pressures ulcers Goals: Patient will remain free of pressure ulcers Date Initiated: 09/15/2022 Date Inactivated: 09/28/2022 Target Resolution Date: 11/09/2022 Unmet Reason: new pressure ulcer Goal Status: Unmet right heel Patient/caregiver will verbalize risk factors for pressure ulcer development Date Initiated: 09/15/2022 Target Resolution Date: 03/01/2023 Goal Status: Active Interventions: Assess potential for pressure ulcer upon admission and as needed Treatment Activities: Patient referred for pressure reduction/relief devices : 09/15/2022 Pressure reduction/relief device ordered : 09/15/2022 Notes: Wound/Skin Impairment Nursing Diagnoses: Impaired tissue integrity Knowledge deficit related to ulceration/compromised skin integrity Goals: Patient/caregiver will verbalize understanding of skin care regimen Date Initiated: 09/28/2022 Target Resolution Date: 03/08/2023 Goal Status: Active Ulcer/skin breakdown will have a volume reduction of 50% by week 8 Date Initiated: 09/28/2022 Date Inactivated: 11/01/2022 Target Resolution Date: 11/02/2022 Goal Status: Met Ulcer/skin breakdown will have a volume reduction of 80% by week 12 Date Initiated: 11/01/2022 Date Inactivated: 11/29/2022 Target Resolution Date: 11/29/2022 Unmet Reason: vac in place, still Goal Status: Unmet debriding heel Interventions: Assess  patient/caregiver ability to obtain necessary supplies Assess patient/caregiver ability to perform ulcer/skin care regimen upon admission and as needed Assess ulceration(s) every visit Provide education on ulcer and skin care Treatment Activities: Skin care regimen initiated : 09/28/2022 Topical wound management initiated : 09/28/2022 Notes: Electronic Signature(s) Signed: 02/21/2023 5:46:42 PM By: Zenaida Deed  RN, BSN [place], Tawny Asal ([id number]) [id number] Page 5 of 10 Entered By: Zenaida Deed on 02/21/2023 14:42:49 -------------------------------------------------------------------------------- Negative Pressure Wound Therapy Maintenance (NPWT) Details Patient Name: Date of Service: [name], [name] 02/21/2023 2:00 PM Medical Record Number: [id number] Patient Account Number: [id number] Date of Birth/Sex: Treating RN: October 27, 1950 (71 y.o. [name] Primary Care Desia Saban: Vincent Gros Other Clinician: Referring Wesleigh Markovic: Treating Samanthamarie Ezzell/Extender: Greer Pickerel in Treatment: 23 NPWT Maintenance Performed for: Wound #1 Right Gluteus Additional Injuries Covered: No Performed By: Zenaida Deed, RN Coverage Size (sq cm): 4.5 Pressure Type: Constant Pressure Setting: 125 mmHG Drain Type: None Primary Contact: Other : silver collagen Sponge/Dressing Type: Foam- Black Date Initiated: 10/16/2022 Dressing Removed: Yes Quantity of Sponges/Gauze Removed: 1 Canister Changed: No Canister Exudate Volume: 10 Dressing Reapplied: Yes Quantity of Sponges/Gauze Inserted: 1 Respones T Treatment: o good Days On NPWT : 129 Post Procedure Diagnosis Same as Pre-procedure Electronic Signature(s) Signed: 02/21/2023 5:46:42 PM By: Zenaida Deed RN, BSN Entered By: Zenaida Deed on 02/21/2023 14:48:53 -------------------------------------------------------------------------------- Pain Assessment Details Patient Name: Date of Service: Marisa Score. 02/21/2023 2:00 PM Medical Record Number: [id number] Patient Account Number: [id number] Date of Birth/Sex: Treating RN: 1950-08-19 (72 y.o. [name] Primary Care Eulah Walkup: Vincent Gros Other Clinician: Referring Rollande Thursby: Treating Nishi Neiswonger/Extender: Greer Pickerel in Treatment: 23 Active Problems Location of Pain Severity and Description of  Pain Patient Has Paino Yes Site Locations Pain Location: [name], [name] ([id number]) [id number] Page 6 of 10 Pain Location: Pain in Ulcers With Dressing Change: Yes Duration of the Pain. Constant / Intermittento Intermittent Rate the pain. Current Pain Level: 1 Worst Pain Level: 3 Least Pain Level: 0 Character of Pain Describe the Pain: Other: sore Pain Management and Medication Current Pain Management: Is the Current Pain Management Adequate: Adequate How does your wound impact your activities of daily livingo Sleep: No Bathing: No Appetite: No Relationship With Others: No Bladder Continence: No Emotions: No Bowel Continence: No Work: No Toileting: No Drive: No Dressing: No Hobbies: No Electronic Signature(s) Signed: 02/21/2023 5:46:42 PM By: Zenaida Deed RN, BSN Entered By: Zenaida Deed on 02/21/2023 14:22:31 -------------------------------------------------------------------------------- Patient/Caregiver Education Details Patient Name: Date of Service: Marisa Score 7/10/2024andnbsp2:00 PM Medical Record Number: [id number] Patient Account Number: [id number] Date of Birth/Gender: Treating RN: 1951-06-21 (72 y.o. [name] Primary Care Physician: Vincent Gros Other Clinician: Referring Physician: Treating Physician/Extender: Greer Pickerel in Treatment: 13 Education Assessment Education Provided To: Patient Education Topics Provided Pressure: Methods: Explain/Verbal Responses: Reinforcements needed, State content correctly Wound/Skin Impairment: Methods: Explain/Verbal Responses: Reinforcements needed, State content correctly Electronic Signature(s) Signed: 02/21/2023 5:46:42 PM By: Zenaida Deed RN, BSN [place], Tawny Asal ([id number]) [id number] Page 7 of 10 Entered By: Zenaida Deed on 02/21/2023  14:43:12 -------------------------------------------------------------------------------- Wound Assessment Details Patient Name: Date of Service: [name], [name] 02/21/2023 2:00 PM Medical Record Number: [id number] Patient Account Number: [id number] Date of Birth/Sex: Treating RN: April 10, 1951 (72 y.o. [name] Primary Care Bartlett Enke: Vincent Gros Other Clinician: Referring Toretto Tingler: Treating Karlea Mckibbin/Extender: Greer Pickerel in Treatment: 23 Wound Status Wound Number: 1 Primary Pressure Ulcer Etiology: Wound Location: Right  Gluteus Wound Open Wounding Event: Pressure Injury Status: Date Acquired: 08/11/2022 Comorbid Chronic sinus problems/congestion, Sleep Apnea, Congestive Heart Weeks Of Treatment: 23 History: Failure, Raynauds, Rheumatoid Arthritis, Neuropathy Clustered Wound: No Photos Wound Measurements Length: (cm) 1.5 Width: (cm) 3 Depth: (cm) 9.5 Area: (cm) 3.534 Volume: (cm) 33.576 % Reduction in Area: 83.9% % Reduction in Volume: 74.6% Epithelialization: Small (1-33%) Tunneling: No Undermining: No Wound Description Classification: Category/Stage IV Wound Margin: Well defined, not attached Exudate Amount: Large Exudate Type: Serosanguineous Exudate Color: red, brown Foul Odor After Cleansing: No Slough/Fibrino Yes Wound Bed Granulation Amount: Large (67-100%) Exposed Structure Granulation Quality: Red Fascia Exposed: No Necrotic Amount: None Present (0%) Fat Layer (Subcutaneous Tissue) Exposed: Yes Tendon Exposed: No Muscle Exposed: No Joint Exposed: No Bone Exposed: No Periwound Skin Texture Texture Color No Abnormalities Noted: No No Abnormalities Noted: Yes Callus: No Temperature / Pain Crepitus: No Temperature: No Abnormality Excoriation: No Tenderness on Palpation: Yes Induration: No Rash: No Scarring: No [address] [name], [name] ([id number]) [id number] Page 8 of 10 No  Abnormalities Noted: Yes Treatment Notes Wound #1 (Gluteus) Wound Laterality: Right Cleanser Wound Cleanser Discharge Instruction: Cleanse the wound with wound cleanser prior to applying a clean dressing using gauze sponges, not tissue or cotton balls. Peri-Wound Care Topical Primary Dressing Promogran Prisma Matrix, 4.34 (sq in) (silver collagen) Discharge Instruction: Moisten collagen with saline or hydrogel VAC Secondary Dressing Duoderm Discharge Instruction: apply to periwound, under vac drape Secured With Compression Wrap Compression Stockings Add-Ons Electronic Signature(s) Signed: 02/21/2023 5:46:42 PM By: Zenaida Deed RN, BSN Entered By: Zenaida Deed on 02/21/2023 14:42:12 -------------------------------------------------------------------------------- Wound Assessment Details Patient Name: Date of Service: Marisa Score. 02/21/2023 2:00 PM Medical Record Number: [id number] Patient Account Number: [id number] Date of Birth/Sex: Treating RN: [birth year] (72 y.o. [name] Primary Care Maylon Sailors: Vincent Gros Other Clinician: Referring Graceanna Theissen: Treating Raeanne Deschler/Extender: Greer Pickerel in Treatment: 23 Wound Status Wound Number: 3 Primary Pressure Ulcer Etiology: Wound Location: Right Calcaneus Wound Open Wounding Event: Pressure Injury Status: Date Acquired: 09/21/2022 Comorbid Chronic sinus problems/congestion, Sleep Apnea, Congestive Heart Weeks Of Treatment: 20 History: Failure, Raynauds, Rheumatoid Arthritis, Neuropathy Clustered Wound: No Photos Wound Measurements Length: (cm) 1.7 [name], [name] ([id number]) Width: (cm) 3.9 Depth: (cm) 1 Area: (cm) 5.207 Volume: (cm) 5.207 % Reduction in Area: 46.1% [id number] Page 9 of 10 % Reduction in Volume: -439% Epithelialization: None Tunneling: No Undermining: No Wound Description Classification: Category/Stage IV Wound Margin: Distinct,  outline attached Exudate Amount: Medium Exudate Type: Serosanguineous Exudate Color: red, brown Foul Odor After Cleansing: No Slough/Fibrino Yes Wound Bed Granulation Amount: Medium (34-66%) Exposed Structure Granulation Quality: Red, Pale Fascia Exposed: No Necrotic Amount: Medium (34-66%) Fat Layer (Subcutaneous Tissue) Exposed: Yes Necrotic Quality: Adherent Slough Tendon Exposed: No Muscle Exposed: No Joint Exposed: No Bone Exposed: Yes Periwound Skin Texture Texture Color No Abnormalities Noted: Yes No Abnormalities Noted: Yes Moisture Temperature / Pain No Abnormalities Noted: No Temperature: No Abnormality Dry / Scaly: No Tenderness on Palpation: Yes Maceration: No Treatment Notes Wound #3 (Calcaneus) Wound Laterality: Right Cleanser Peri-Wound Care Zinc Oxide Ointment 30g tube Discharge Instruction: Apply Zinc Oxide to periwound with each dressing change Topical Gentamicin Discharge Instruction: mix 50:50 with mupirocin and rub into wound bed Mupirocin Ointment Discharge Instruction: Apply Mupirocin (Bactroban) as instructed Primary Dressing Maxorb Extra Ag+ Alginate Dressing, 4x4.75 (in/in) Discharge Instruction: Apply to wound bed as instructed Secondary Dressing ALLEVYN Heel 4 1/2in x 5 1/2in / 10.5cm x 13.5cm Discharge Instruction: Apply over  primary dressing as directed. Woven Gauze Sponge, Non-Sterile 4x4 in Discharge Instruction: Apply over primary dressing as directed. Secured With [employer] 4x5 (in/yd) Discharge Instruction: Secure with Coban as directed. Kerlix Roll Sterile, 4.5x3.1 (in/yd) Discharge Instruction: Secure with Kerlix as directed. Compression Wrap Compression Stockings Add-Ons Electronic Signature(s) Signed: 02/21/2023 5:46:42 PM By: Zenaida Deed RN, BSN Entered By: Zenaida Deed on 02/21/2023 14:41:37 [name] ([id number]) [id number] Page 10 of  10 -------------------------------------------------------------------------------- Vitals Details Patient Name: Date of Service: [name], [name] 02/21/2023 2:00 PM Medical Record Number: [id number] Patient Account Number: [id number] Date of Birth/Sex: Treating RN: 1951-08-08 (72 y.o. [name] Primary Care Arthor Gorter: Vincent Gros Other Clinician: Referring Ora Bollig: Treating Mireille Lacombe/Extender: Greer Pickerel in Treatment: 23 Vital Signs Time Taken: 14:21 Temperature (F): 98 Height (in): 62 Pulse (bpm): 69 Weight (lbs): 207 Respiratory Rate (breaths/min): 20 Body Mass Index (BMI): 37.9 Blood Pressure (mmHg): 150/73 Reference Range: 80 - 120 mg / dl Electronic Signature(s) Signed: 02/21/2023 5:46:42 PM By: Zenaida Deed RN, BSN Entered By: Zenaida Deed on 02/21/2023 14:21:54

## 2023-02-23 ENCOUNTER — Other Ambulatory Visit: Payer: Self-pay | Admitting: Family Medicine

## 2023-02-23 ENCOUNTER — Telehealth: Payer: Self-pay | Admitting: *Deleted

## 2023-02-23 DIAGNOSIS — M810 Age-related osteoporosis without current pathological fracture: Secondary | ICD-10-CM

## 2023-02-23 NOTE — Telephone Encounter (Signed)
Pt calling requesting a new order for a dexa scan, the current order was placed by previous provider and she will need a new order to be placed so she can schedule. Please let patient know once this is completed so she can call them.

## 2023-02-25 DIAGNOSIS — R269 Unspecified abnormalities of gait and mobility: Secondary | ICD-10-CM | POA: Diagnosis not present

## 2023-02-25 DIAGNOSIS — G4733 Obstructive sleep apnea (adult) (pediatric): Secondary | ICD-10-CM | POA: Diagnosis not present

## 2023-02-26 NOTE — Telephone Encounter (Signed)
It looks like the order was placed on the 12th of July and scheduled that day as well, nothing else need to be completed.

## 2023-02-26 NOTE — Telephone Encounter (Signed)
From what I can see, it looks like she has an appointment already scheduled for DEXA scan with [facility] imaging on 08/27/2023.  It might have already signed something that they faxed over directly.  Please let me know if this is actually something that I need to resend, or if it has already been taken care of.

## 2023-02-28 ENCOUNTER — Other Ambulatory Visit: Payer: PPO

## 2023-03-03 DIAGNOSIS — N1832 Chronic kidney disease, stage 3b: Secondary | ICD-10-CM | POA: Diagnosis not present

## 2023-03-03 DIAGNOSIS — R7302 Impaired glucose tolerance (oral): Secondary | ICD-10-CM | POA: Diagnosis not present

## 2023-03-03 DIAGNOSIS — I5032 Chronic diastolic (congestive) heart failure: Secondary | ICD-10-CM | POA: Diagnosis not present

## 2023-03-03 DIAGNOSIS — L89313 Pressure ulcer of right buttock, stage 3: Secondary | ICD-10-CM | POA: Diagnosis not present

## 2023-03-03 DIAGNOSIS — L89314 Pressure ulcer of right buttock, stage 4: Secondary | ICD-10-CM | POA: Diagnosis not present

## 2023-03-05 DIAGNOSIS — Z79899 Other long term (current) drug therapy: Secondary | ICD-10-CM | POA: Diagnosis not present

## 2023-03-05 DIAGNOSIS — H2513 Age-related nuclear cataract, bilateral: Secondary | ICD-10-CM | POA: Diagnosis not present

## 2023-03-05 DIAGNOSIS — H5213 Myopia, bilateral: Secondary | ICD-10-CM | POA: Diagnosis not present

## 2023-03-07 ENCOUNTER — Encounter (HOSPITAL_BASED_OUTPATIENT_CLINIC_OR_DEPARTMENT_OTHER): Payer: PPO | Admitting: General Surgery

## 2023-03-07 DIAGNOSIS — L89614 Pressure ulcer of right heel, stage 4: Secondary | ICD-10-CM | POA: Diagnosis not present

## 2023-03-07 NOTE — Progress Notes (Signed)
SANDE, PICKERT ([id number]) [id number] Page 1 of 9 Visit Report for 03/07/2023 Arrival Information Details Patient Name: Date of Service: [name], [name] 03/07/2023 11:00 A M Medical Record Number: [id number] Patient Account Number: [id number] Date of Birth/Sex: Treating RN: 1951/03/02 (72 y.o. Gevena Mart Primary Care Kamiah Fite: Saralyn Pilar Other Clinician: Referring Nastashia Gallo: Treating Yashas Camilli/Extender: Greer Pickerel in Treatment: 25 Visit Information History Since Last Visit All ordered tests and consults were completed: Yes Patient Arrived: [name] Added or deleted any medications: No Arrival Time: 11:15 Any new allergies or adverse reactions: No Accompanied By: self Had a fall or experienced change in No Transfer Assistance: None activities of daily living that may affect Patient Identification Verified: Yes risk of falls: Secondary Verification Process Completed: Yes Signs or symptoms of abuse/neglect since last visito No Patient Requires Transmission-Based Precautions: No Hospitalized since last visit: No Patient Has Alerts: No Implantable device outside of the clinic excluding No cellular tissue based products placed in the center since last visit: Has Dressing in Place as Prescribed: Yes Pain Present Now: No Electronic Signature(s) Signed: 03/07/2023 5:05:32 PM By: Brenton Grills Entered By: Brenton Grills on 03/07/2023 11:20:05 -------------------------------------------------------------------------------- Encounter Discharge Information Details Patient Name: Date of Service: [name] E. 03/07/2023 11:00 A M Medical Record Number: [id number] Patient Account Number: [id number] Date of Birth/Sex: Treating RN: 1950-12-17 (72 y.o. Gevena Mart Primary Care Madelena Maturin: Saralyn Pilar Other Clinician: Referring Locklan Canoy: Treating Jewelle Whitner/Extender: Greer Pickerel in Treatment:  25 Encounter Discharge Information Items Post Procedure Vitals Discharge Condition: Stable Temperature (F): 98 Ambulatory Status: Ambulatory Pulse (bpm): 74 Discharge Destination: Home Respiratory Rate (breaths/min): 18 Transportation: Private Auto Blood Pressure (mmHg): 132/78 Accompanied By: self Schedule Follow-up Appointment: Yes Clinical Summary of Care: Patient Declined Electronic Signature(s) Signed: 03/07/2023 5:05:32 PM By: Brenton Grills Entered By: Brenton Grills on 03/07/2023 12:46:46 [name] ([id number]) [id number] Page 2 of 9 -------------------------------------------------------------------------------- Lower Extremity Assessment Details Patient Name: Date of Service: [name], [name] 03/07/2023 11:00 A M Medical Record Number: [id number] Patient Account Number: [id number] Date of Birth/Sex: Treating RN: [birth year] (72 y.o. Gevena Mart Primary Care Trey Gulbranson: Saralyn Pilar Other Clinician: Referring Maghen Group: Treating Kaitlinn Iversen/Extender: Greer Pickerel in Treatment: 25 Edema Assessment Assessed: [Left: No] [name]: No] Edema: [Left: Ye] [Right: s] Calf Left: Right: Point of Measurement: From Medial Instep 39 cm Ankle Left: Right: Point of Measurement: From Medial Instep 22.5 cm Vascular Assessment Pulses: Dorsalis Pedis Palpable: [Right:Yes] Extremity colors, hair growth, and conditions: Extremity Color: [Right:Normal] Hair Growth on Extremity: [Right:Yes] Temperature of Extremity: [Right:Warm] Capillary Refill: [Right:< 3 seconds] Dependent Rubor: [Right:No] Blanched when Elevated: [Right:No No] Toe Nail Assessment Left: Right: Thick: No Discolored: No Deformed: No Improper Length and Hygiene: No Electronic Signature(s) Signed: 03/07/2023 5:05:32 PM By: Brenton Grills Entered By: Brenton Grills on 03/07/2023  11:24:35 -------------------------------------------------------------------------------- Multi Wound Chart Details Patient Name: Date of Service: [name] E. 03/07/2023 11:00 A M Medical Record Number: [id number] Patient Account Number: [id number] Date of Birth/Sex: Treating RN: [birth year] (72 y.o. F) Primary Care Pavan Bring: Saralyn Pilar Other Clinician: Referring Slayden Mennenga: Treating Albert Hersch/Extender: Greer Pickerel in Treatment: 25 Vital Signs Height(in): 62 Pulse(bpm): 64 Weight(lbs): 207 Blood Pressure(mmHg): 122/54 [name], [name] ([id number]) [phone].pdf Page 3 of 9 Body Mass Index(BMI): 37.9 Temperature(F): 98.7 Respiratory Rate(breaths/min): 18 [1:Photos:] [3:No Photos] [N/A:N/A] Right Gluteus Right Calcaneus N/A Wound Location: Pressure Injury Pressure Injury N/A Wounding Event: Pressure Ulcer Pressure Ulcer N/A Primary Etiology: Chronic  sinus problems/congestion, N/A N/A Comorbid History: Sleep Apnea, Congestive Heart Failure, Raynauds, Rheumatoid Arthritis, Neuropathy 08/11/2022 09/21/2022 N/A Date Acquired: 25 22 N/A Weeks of Treatment: Open Open N/A Wound Status: No No N/A Wound Recurrence: 1.5x3x11 1.7x3.9x1 N/A Measurements L x W x D (cm) 3.534 5.207 N/A A (cm) : rea 38.877 5.207 N/A Volume (cm) : 83.90% 46.10% N/A % Reduction in A rea: 70.50% -439.00% N/A % Reduction in Volume: Category/Stage IV Category/Stage IV N/A Classification: Large Medium N/A Exudate A mount: Serosanguineous Serosanguineous N/A Exudate Type: red, brown red, brown N/A Exudate Color: Well defined, not attached N/A N/A Wound Margin: Large (67-100%) N/A N/A Granulation A mount: Red N/A N/A Granulation Quality: None Present (0%) N/A N/A Necrotic A mount: Fat Layer (Subcutaneous Tissue): Yes N/A N/A Exposed Structures: Fascia: No Tendon: No Muscle: No Joint: No Bone: No Small (1-33%) N/A  N/A Epithelialization: N/A Debridement - Selective/Open Wound N/A Debridement: Pre-procedure Verification/Time Out N/A 12:01 N/A Taken: N/A Lidocaine 4% Topical Solution N/A Pain Control: N/A Slough N/A Tissue Debrided: N/A Non-Viable Tissue N/A Level: N/A 5.2 N/A Debridement A (sq cm): rea N/A Curette N/A Instrument: N/A Minimum N/A Bleeding: N/A Pressure N/A Hemostasis A chieved: N/A 0 N/A Procedural Pain: N/A 0 N/A Post Procedural Pain: N/A Procedure was tolerated well N/A Debridement Treatment Response: N/A 1.7x3.9x0.5 N/A Post Debridement Measurements L x W x D (cm) N/A 2.604 N/A Post Debridement Volume: (cm) N/A Category/Stage IV N/A Post Debridement Stage: Excoriation: No No Abnormalities Noted N/A Periwound Skin Texture: Induration: No Callus: No Crepitus: No Rash: No Scarring: No Maceration: No No Abnormalities Noted N/A Periwound Skin Moisture: Dry/Scaly: No Atrophie Blanche: No No Abnormalities Noted N/A Periwound Skin Color: Cyanosis: No Ecchymosis: No Erythema: No Hemosiderin Staining: No Mottled: No Pallor: No Rubor: No No Abnormality N/A N/A Temperature: Yes N/A N/A Tenderness on Palpation: N/A Debridement N/A Procedures Performed: IRENE, COLLINGS ([id number]) [id number] Page 4 of 9 Treatment Notes Electronic Signature(s) Signed: 03/07/2023 12:06:51 PM By: Duanne Guess MD FACS Entered By: Duanne Guess on 03/07/2023 12:06:50 -------------------------------------------------------------------------------- Multi-Disciplinary Care Plan Details Patient Name: Date of Service: [name] E. 03/07/2023 11:00 A M Medical Record Number: [id number] Patient Account Number: [id number] Date of Birth/Sex: Treating RN: August 01, 1951 (72 y.o. Gevena Mart Primary Care Luise Yamamoto: Saralyn Pilar Other Clinician: Referring Stephana Morell: Treating Brenyn Petrey/Extender: Greer Pickerel in Treatment:  25 Multidisciplinary Care Plan reviewed with physician Active Inactive Pressure Nursing Diagnoses: Knowledge deficit related to management of pressures ulcers Goals: Patient will remain free of pressure ulcers Date Initiated: 09/15/2022 Date Inactivated: 09/28/2022 Target Resolution Date: 11/09/2022 Unmet Reason: new pressure ulcer Goal Status: Unmet right heel Patient/caregiver will verbalize risk factors for pressure ulcer development Date Initiated: 09/15/2022 Target Resolution Date: 03/01/2023 Goal Status: Active Interventions: Assess potential for pressure ulcer upon admission and as needed Treatment Activities: Patient referred for pressure reduction/relief devices : 09/15/2022 Pressure reduction/relief device ordered : 09/15/2022 Notes: Wound/Skin Impairment Nursing Diagnoses: Impaired tissue integrity Knowledge deficit related to ulceration/compromised skin integrity Goals: Patient/caregiver will verbalize understanding of skin care regimen Date Initiated: 09/28/2022 Target Resolution Date: 03/08/2023 Goal Status: Active Ulcer/skin breakdown will have a volume reduction of 50% by week 8 Date Initiated: 09/28/2022 Date Inactivated: 11/01/2022 Target Resolution Date: 11/02/2022 Goal Status: Met Ulcer/skin breakdown will have a volume reduction of 80% by week 12 Date Initiated: 11/01/2022 Date Inactivated: 11/29/2022 Target Resolution Date: 11/29/2022 Unmet Reason: vac in place, still Goal Status: Unmet debriding heel Interventions: Assess patient/caregiver ability to obtain necessary  supplies Assess patient/caregiver ability to perform ulcer/skin care regimen upon admission and as needed Assess ulceration(s) every visit Provide education on ulcer and skin care [name], [name] ([id number]) [phone].pdf Page 5 of 9 Treatment Activities: Skin care regimen initiated : 09/28/2022 Topical wound management initiated : 09/28/2022 Notes: Electronic  Signature(s) Signed: 03/07/2023 5:05:32 PM By: Brenton Grills Entered By: Brenton Grills on 03/07/2023 12:03:55 -------------------------------------------------------------------------------- Negative Pressure Wound Therapy Maintenance (NPWT) Details Patient Name: Date of Service: [name], [name] 03/07/2023 11:00 A M Medical Record Number: [id number] Patient Account Number: [id number] Date of Birth/Sex: Treating RN: [birth year] (72 y.o. Gevena Mart Primary Care Zen Cedillos: Saralyn Pilar Other Clinician: Referring Lancelot Alyea: Treating Huong Luthi/Extender: Greer Pickerel in Treatment: 25 NPWT Maintenance Performed for: Wound #1 Right Gluteus Additional Injuries Covered: No Performed By: Brenton Grills, RN Coverage Size (sq cm): 4.5 Pressure Type: Constant Pressure Setting: 125 mmHG Drain Type: None Primary Contact: Other : Sponge/Dressing Type: Foam- Black Date Initiated: 10/16/2022 Dressing Removed: Yes Quantity of Sponges/Gauze Removed: 3 Canister Changed: No Canister Exudate Volume: 25 Dressing Reapplied: Yes Quantity of Sponges/Gauze Inserted: 3 Days On NPWT : 143 Post Procedure Diagnosis Same as Pre-procedure Notes Scribed for Dr Lady Gary by Brenton Grills RN Electronic Signature(s) Signed: 03/07/2023 5:05:32 PM By: Brenton Grills Entered By: Brenton Grills on 03/07/2023 12:08:01 -------------------------------------------------------------------------------- Pain Assessment Details Patient Name: Date of Service: [name] E. 03/07/2023 11:00 A M Medical Record Number: [id number] Patient Account Number: [id number] Date of Birth/Sex: Treating RN: 12-Jul-1951 (72 y.o. Gevena Mart Primary Care Ryian Lynde: Saralyn Pilar Other Clinician: Referring Kimmie Doren: Treating Alezander Dimaano/Extender: Greer Pickerel in Treatment: 25 Active Problems Location of Pain Severity and Description of Pain LARESHA, BACORN ([id number])  [phone].pdf Page 6 of 9 Patient Has Paino No Site Locations Pain Management and Medication Current Pain Management: Electronic Signature(s) Signed: 03/07/2023 5:05:32 PM By: Brenton Grills Entered By: Brenton Grills on 03/07/2023 11:23:34 -------------------------------------------------------------------------------- Patient/Caregiver Education Details Patient Name: Date of Service: [name], Marisa E. 7/24/2024andnbsp11:00 A M Medical Record Number: [id number] Patient Account Number: [id number] Date of Birth/Gender: Treating RN: May 28, 1951 (72 y.o. Gevena Mart Primary Care Physician: Saralyn Pilar Other Clinician: Referring Physician: Treating Physician/Extender: Greer Pickerel in Treatment: 25 Education Assessment Education Provided To: Patient Education Topics Provided Wound/Skin Impairment: Methods: Demonstration Responses: State content correctly [employer]) Signed: 03/07/2023 5:05:32 PM By: Brenton Grills Entered By: Brenton Grills on 03/07/2023 12:04:14 -------------------------------------------------------------------------------- Wound Assessment Details Patient Name: Date of Service: [name] 03/07/2023 11:00 A M Medical Record Number: [id number] Patient Account Number: [id number] Date of Birth/Sex: Treating RN: 06-Apr-1951 (72 y.o. Tyniesha, Howald, Atlantis E ([id number]) [phone].pdf Page 7 of 9 Primary Care Demonte Dobratz: Saralyn Pilar Other Clinician: Referring Brenae Lasecki: Treating Zabrina Brotherton/Extender: Greer Pickerel in Treatment: 25 Wound Status Wound Number: 1 Primary Pressure Ulcer Etiology: Wound Location: Right Gluteus Wound Open Wounding Event: Pressure Injury Status: Date Acquired: 08/11/2022 Comorbid Chronic sinus problems/congestion, Sleep Apnea, Congestive Heart Weeks Of Treatment: 25 History: Failure, Raynauds, Rheumatoid  Arthritis, Neuropathy Clustered Wound: No Photos Wound Measurements Length: (cm) 1.5 Width: (cm) 3 Depth: (cm) 11 Area: (cm) 3.534 Volume: (cm) 38.877 % Reduction in Area: 83.9% % Reduction in Volume: 70.5% Epithelialization: Small (1-33%) Wound Description Classification: Category/Stage IV Wound Margin: Well defined, not attached Exudate Amount: Large Exudate Type: Serosanguineous Exudate Color: red, brown Foul Odor After Cleansing: No Slough/Fibrino Yes Wound Bed Granulation Amount: Large (67-100%) Exposed Structure Granulation Quality: Red Fascia Exposed: No  Necrotic Amount: None Present (0%) Fat Layer (Subcutaneous Tissue) Exposed: Yes Tendon Exposed: No Muscle Exposed: No Joint Exposed: No Bone Exposed: No Periwound Skin Texture Texture Color No Abnormalities Noted: No No Abnormalities Noted: Yes Callus: No Temperature / Pain Crepitus: No Temperature: No Abnormality Excoriation: No Tenderness on Palpation: Yes Induration: No Rash: No Scarring: No Moisture No Abnormalities Noted: Yes Treatment Notes Wound #1 (Gluteus) Wound Laterality: Right Cleanser Wound Cleanser Discharge Instruction: Cleanse the wound with wound cleanser prior to applying a clean dressing using gauze sponges, not tissue or cotton balls. Peri-Wound Care Topical Primary Dressing [name], [name] ([id number]) [phone].pdf Page 8 of 9 Promogran Prisma Matrix, 4.34 (sq in) (silver collagen) Discharge Instruction: Moisten collagen with saline or hydrogel VAC Secondary Dressing Duoderm Discharge Instruction: apply to periwound, under vac drape Secured With Compression Wrap Compression Stockings Add-Ons Electronic Signature(s) Signed: 03/07/2023 5:05:32 PM By: Brenton Grills Entered By: Brenton Grills on 03/07/2023 11:44:53 -------------------------------------------------------------------------------- Wound Assessment Details Patient Name: Date of Service: [name]. 03/07/2023 11:00 A M Medical Record Number: [id number] Patient Account Number: [id number] Date of Birth/Sex: Treating RN: August 13, 1951 (72 y.o. Gevena Mart Primary Care Milanni Ayub: Saralyn Pilar Other Clinician: Referring Scott Fix: Treating Hilarie Sinha/Extender: Greer Pickerel in Treatment: 25 Wound Status Wound Number: 3 Primary Etiology: Pressure Ulcer Wound Location: Right Calcaneus Wound Status: Open Wounding Event: Pressure Injury Date Acquired: 09/21/2022 Weeks Of Treatment: 22 Clustered Wound: No Wound Measurements Length: (cm) 1.7 Width: (cm) 3.9 Depth: (cm) 1 Area: (cm) 5.207 Volume: (cm) 5.207 % Reduction in Area: 46.1% % Reduction in Volume: -439% Wound Description Classification: Category/Stage IV Exudate Amount: Medium Exudate Type: Serosanguineous Exudate Color: red, brown Periwound Skin Texture Texture Color No Abnormalities Noted: No No Abnormalities Noted: No Moisture No Abnormalities Noted: No Treatment Notes Wound #3 (Calcaneus) Wound Laterality: Right Cleanser Peri-Wound Care Zinc Oxide Ointment 30g tube Discharge Instruction: Apply Zinc Oxide to periwound with each dressing change Topical [name], [name] ([id number]) [id number] Page 9 of 9 Gentamicin Discharge Instruction: mix 50:50 with mupirocin and rub into wound bed Mupirocin Ointment Discharge Instruction: Apply Mupirocin (Bactroban) as instructed Primary Dressing Maxorb Extra Ag+ Alginate Dressing, 4x4.75 (in/in) Discharge Instruction: Apply to wound bed as instructed Secondary Dressing ALLEVYN Heel 4 1/2in x 5 1/2in / 10.5cm x 13.5cm Discharge Instruction: Apply over primary dressing as directed. Woven Gauze Sponge, Non-Sterile 4x4 in Discharge Instruction: Apply over primary dressing as directed. Secured With [employer] 4x5 (in/yd) Discharge Instruction: Secure with Coban as directed. Kerlix Roll Sterile,  4.5x3.1 (in/yd) Discharge Instruction: Secure with Kerlix as directed. Compression Wrap Compression Stockings Add-Ons Electronic Signature(s) Signed: 03/07/2023 5:05:32 PM By: Brenton Grills Entered By: Brenton Grills on 03/07/2023 11:41:29 -------------------------------------------------------------------------------- Vitals Details Patient Name: Date of Service: [name] E. 03/07/2023 11:00 A M Medical Record Number: [id number] Patient Account Number: [id number] Date of Birth/Sex: Treating RN: [birth year] (72 y.o. Gevena Mart Primary Care Carolanne Mercier: Saralyn Pilar Other Clinician: Referring Manasseh Pittsley: Treating Marithza Malachi/Extender: Greer Pickerel in Treatment: 25 Vital Signs Time Taken: 11:20 Temperature (F): 98.7 Height (in): 62 Pulse (bpm): 64 Weight (lbs): 207 Respiratory Rate (breaths/min): 18 Body Mass Index (BMI): 37.9 Blood Pressure (mmHg): 122/54 Reference Range: 80 - 120 mg / dl Electronic Signature(s) Signed: 03/07/2023 5:05:32 PM By: Brenton Grills Entered By: Brenton Grills on 03/07/2023 11:23:19

## 2023-03-07 NOTE — Progress Notes (Signed)
[name], [name] ([id number]) [id number] Page 1 of 11 Visit Report for 03/07/2023 Chief Complaint Document Details Patient Name: Date of Service: [name] 03/07/2023 11:00 A M Medical Record Number: [id number] Patient Account Number: [id number] Date of Birth/Sex: Treating RN: 10/01/1950 (72 y.o. F) Primary Care Provider: Saralyn Cooper Other Clinician: Referring Provider: Treating Provider/Extender: [name] in Treatment: 25 Information Obtained from: Patient Chief Complaint Patient is at the clinic for treatment of open pressure ulcers Electronic Signature(s) Signed: 03/07/2023 12:06:57 PM By: Marisa Guess MD FACS Entered By: [name] on 03/07/2023 12:06:57 -------------------------------------------------------------------------------- Debridement Details Patient Name: Date of Service: [name]. 03/07/2023 11:00 A M Medical Record Number: [id number] Patient Account Number: [id number] Date of Birth/Sex: Treating RN: [birth year] (72 y.o. [name] Primary Care Provider: Saralyn Cooper Other Clinician: Referring Provider: Treating Provider/Extender: [name] in Treatment: 25 Debridement Performed for Assessment: Wound #3 Right Calcaneus Performed By: Physician Marisa Guess, MD Debridement Type: Debridement Level of Consciousness (Pre-procedure): Awake and Alert Pre-procedure Verification/Time Out Yes - 12:01 Taken: Start Time: 12:02 Pain Control: Lidocaine 4% Topical Solution Percent of Wound Bed Debrided: 100% T Area Debrided (cm): otal 5.2 Tissue and other material debrided: Slough, Slough Level: Non-Viable Tissue Debridement Description: Selective/Open Wound Instrument: Curette Bleeding: Minimum Hemostasis Achieved: Pressure End Time: 12:05 Procedural Pain: 0 Post Procedural Pain: 0 Response to Treatment: Procedure was tolerated well Level of  Consciousness (Post- Awake and Alert procedure): Post Debridement Measurements of Total Wound Length: (cm) 1.7 Stage: Category/Stage IV Width: (cm) 3.9 Depth: (cm) 0.5 Volume: (cm) 2.604 Character of Wound/Ulcer Post Debridement: Improved [name], [name] ([id number]) [id number] Page 2 of 11 Post Procedure Diagnosis Same as Pre-procedure Notes Scribed for Dr [name] by Marisa Grills RN Electronic Signature(s) Signed: 03/07/2023 12:18:26 PM By: Marisa Guess MD FACS Signed: 03/07/2023 5:05:32 PM By: [name] Entered By: [name] on 03/07/2023 12:03:01 -------------------------------------------------------------------------------- HPI Details Patient Name: Date of Service: Marisa Roux E. 03/07/2023 11:00 A M Medical Record Number: [id number] Patient Account Number: [id number] Date of Birth/Sex: Treating RN: Oct 23, 1950 (72 y.o. F) Primary Care Provider: Saralyn Cooper Other Clinician: Referring Provider: Treating Provider/Extender: [name] in Treatment: 25 History of Present Illness HPI Description: ADMISSION 09/07/2023 This is a 72 year old woman with a past medical history notable for obesity, congestive heart failure, Raynaud's syndrome, CKD stage IIIb, osteoporosis, and rheumatoid arthritis. In November 2023, she suffered a fall that resulted in a femur fracture. She was hospitalized for about a week and underwent surgical repair of the fracture. She subsequently developed pressure ulcers on her right buttocks and ischium. She has been receiving home health services and they have been applying Medihoney. They have been reporting the wounds as stage II, but on evaluation, the large ulcer was probably unstageable at the time they were evaluating it but it is clearly a stage IV at this point. The smaller ulcer does have fat layer exposure and therefore is a stage III. The patient is accompanied by her daughter.  She says she has been sleeping on a regular bed but recently ordered an air mattress T opper, but does not have it yet. She is on Ozempic for weight loss and therefore has a poor appetite and struggles to get adequate protein intake. 09/15/2022: The large stage IV ulcer is substantially cleaner this week. The stage III ulcer is a little smaller. Both have slough accumulation. The culture that I took last week was polymicrobial. The  Augmentin that I prescribed was adequate coverage for the species and she continues to take this. We have also ordered Keystone topical antibiotic compound, but this has not yet arrived. 09/21/2022: The stage IV ulcer has some necrotic muscle at the base but is otherwise fairly clean. The stage III ulcer is smaller again with light slough on the surface. She has her Keystone topical antibiotic compound with her today. 09/29/2022: There is still some necrotic muscle at the base of the stage IV ulcer that I was unable to get to last week. The stage III ulcer has healed. She unfortunately has developed a new pressure ulcer on her right heel. 10/06/2022: Still with some devitalized muscle at the base of the stage IV ulcer, but otherwise this wound is looking quite clean. The pressure induced tissue injury on her heel is demarcating and much of the area appears to be epithelialized, but there is still 2 areas that remain questionable. 10/12/2022: The stage IV ulcer is very clean and ready for wound VAC, which will be delivered tomorrow according to the patient. The tissue injury on her heel is drying up and does not feel particularly boggy this week. 10/19/2022: The heel injury continues to improve. There is still some dry eschar present on the more plantar aspect of it. There is some slough accumulation on the surface of the stage IV pressure ulcer. The wound VAC was initiated by home health last week. 11/01/2022: The gluteal pressure ulcer is very clean without any necrotic tissue or  slough. The depth has come in by over half a centimeter. The deep tissue injury on her heel continues to improve. There is still dry eschar that we are painting with Betadine as well as some fresh-looking viable tissue at the margin. 11/15/2022: The gluteal pressure ulcer is clean and contracting. The eschar on her heel ulcer is beginning to separate from the underlying tissue. 11/29/2022: The gluteal pressure ulcer is very clean. The depth has come in by about a centimeter. The eschar has completely separated off of her heel. There is some fibrinous exudate and slough on the heel. It has declared itself as a stage III at this point. 12/13/2022: The heel ulcer is smaller and much cleaner, but still has some non-viable tissue present. The orifice of the gluteal ulcer is contracting, but the depth remains the same. Fortunately, the sponge for the [facility] is being packed appropriately into the full depth of the wound. 12/27/2022: The heel ulcer is smaller, but I think the depth and degree of tissue injury has finally declared itself, with tendon being exposed at the posterior aspect of the ulcer. It is now stage IV. The gluteal ulcer continues to contract circumferentially, but the depth is still unchanged. 01/10/2023: The heel ulcer has contracted further. There is still rubbery slough on the surface. There has been no further tissue breakdown; I think the last of the nonviable tissue was removed at her visit 2 weeks ago. The gluteal ulcer has contracted further circumferentially, but there is still a tunnel that ankles off cranially that remains about the same depth. The tissues appear viable at both sites. 01/24/2023: Unfortunately, the home health nurse that was applying the wound VAC did not put any drape on her skin for the bridge and was applying sponge directly to the patient's skin. This is resulted in significant tissue breakdown and pain for the patient. Once we were notified of this, we discontinued the  wound VAC and they have been packing the wound with  saline moistened gauze. The heel has also deteriorated. Bone is now exposed. 01/30/2023: Her skin looks significantly better this week. It has recovered from the insult caused by applying the sponge directly to it. Her heel looks a little bit PEYSON, DELAO ([id number]) [phone].pdf Page 3 of 11 better as well. The culture that I took last week grew out group A strep, Proteus mirabilis, and Enterococcus, along with other skin flora. Although Levaquin was the recommended antibiotic, she is on amiodarone and therefore Levaquin is contraindicated. She has been taking Augmentin and is showing signs of improvement. 02/07/2023: The insult to her skin caused by direct application of wound VAC sponge has nearly completely healed. The gluteal ulcer cavity continues to contract circumferentially, but still probes quite deeply; I suspect the sponge for her VAC is not getting placed completely down into that tunnel. The heel is looking better today. There is more granulation filling in. I did not feel bone today. 02/14/2023: The gluteal ulcer cavity has circumferentially contracted even further, although the depth remains the same. The heel has improved and there is good tissue overlying where the bone had been exposed. Both wounds have some slough on the surface. 02/21/2023: The gluteal ulcer cavity continues to contract and is about half a centimeter shallower this week. The surface is clean without any slough or other debris accumulation. The heel continues to fill in granulation tissue over where the bone had been exposed. There is some slough on the wound surface. 03/07/2023: The gluteal ulcer was measured slightly deeper today, but on palpation, it feels roughly the same. The cavity is certainly more contracted. She was complaining of more pain in the area and it appears that GranuFoam was applied directly to her skin and she has some  irritation from this. The heel ulcer is filling in with excellent granulation tissue. It does not probe to bone any longer. Minimal slough accumulation. Electronic Signature(s) Signed: 03/07/2023 12:08:27 PM By: Marisa Guess MD FACS Entered By: [name] on 03/07/2023 12:08:27 -------------------------------------------------------------------------------- Physical Exam Details Patient Name: Date of Service: Marisa Roux E. 03/07/2023 11:00 A M Medical Record Number: [id number] Patient Account Number: [id number] Date of Birth/Sex: Treating RN: July 09, 1951 (72 y.o. F) Primary Care Provider: Saralyn Cooper Other Clinician: Referring Provider: Treating Provider/Extender: [name] in Treatment: 25 Constitutional . . . . no acute distress. Respiratory Normal work of breathing on room air. Notes 03/07/2023: The gluteal ulcer was measured slightly deeper today, but on palpation, it feels roughly the same. The cavity is certainly more contracted. She was complaining of more pain in the area and it appears that GranuFoam was applied directly to her skin and she has some irritation from this. The heel ulcer is filling in with excellent granulation tissue. It does not probe to bone any longer. Minimal slough accumulation. Electronic Signature(s) Signed: 03/07/2023 12:09:11 PM By: Marisa Guess MD FACS Entered By: [name] on 03/07/2023 12:09:11 -------------------------------------------------------------------------------- Physician Orders Details Patient Name: Date of Service: Marisa Roux E. 03/07/2023 11:00 A M Medical Record Number: [id number] Patient Account Number: [id number] Date of Birth/Sex: Treating RN: Aug 30, 1950 (72 y.o. [name] Primary Care Provider: Saralyn Cooper Other Clinician: Referring Provider: Treating Provider/Extender: [name] in Treatment: 25 Verbal / Phone Orders:  No Diagnosis Coding ICD-10 Coding Code Description TALISA, PETRAK ([id number]) [phone].pdf Page 4 of 11 L89.314 Pressure ulcer of right buttock, stage 4 L89.614 Pressure ulcer of right heel, stage 4 I73.00 Raynaud's syndrome without  gangrene I50.32 Chronic diastolic (congestive) heart failure N18.32 Chronic kidney disease, stage 3b R73.02 Impaired glucose tolerance (oral) E66.9 Obesity, unspecified Follow-up Appointments ppointment in 2 weeks. - Dr [name] Rm 3 Return A Anesthetic Wound #1 Right Gluteus (In clinic) Topical Lidocaine 4% applied to wound bed Bathing/ Shower/ Hygiene May shower and wash wound with soap and water. Negative Presssure Wound Therapy Wound #1 Right Gluteus Wound Vac to wound continuously at 114mm/hg pressure - resume changes 3 times per week Black Foam Off-Loading Heel suspension boot to: - globoped right foot to ambulate Low air-loss mattress (Group 2) - Adapt Turn and reposition every 2 hours - avoid lying on back, stand at least every hour while out of bed, float heels off bed with pillows under calves while in bed Prevalon Boot - right foot esp. while in bed. Additional Orders / Instructions Follow Nutritious Diet - increase protein to 70-80 gms per day, hold ozempic Juven Shake 1-2 times daily. Home Health New wound care orders this week; continue Home Health for wound care. May utilize formulary equivalent dressing for wound treatment orders unless otherwise specified. - resume VAC, bridge up toward back, prisma to base of wound then place black foam Duoderm to periwound under drape to protect skin Other Home Health Orders/Instructions: - [facility] Wound Treatment Wound #1 - Gluteus Wound Laterality: Right Cleanser: Wound Cleanser 3 x Per Week/30 Days Discharge Instructions: Cleanse the wound with wound cleanser prior to applying a clean dressing using gauze sponges, not tissue or cotton balls. Prim Dressing:  Promogran Prisma Matrix, 4.34 (sq in) (silver collagen) 3 x Per Week/30 Days ary Discharge Instructions: Moisten collagen with saline or hydrogel Prim Dressing: VAC ary 3 x Per Week/30 Days Secondary Dressing: Duoderm 3 x Per Week/30 Days Discharge Instructions: apply to periwound, under vac drape Wound #3 - Calcaneus Wound Laterality: Right Peri-Wound Care: Zinc Oxide Ointment 30g tube 1 x Per Day/30 Days Discharge Instructions: Apply Zinc Oxide to periwound with each dressing change Topical: Gentamicin 1 x Per Day/30 Days Discharge Instructions: mix 50:50 with mupirocin and rub into wound bed Topical: Mupirocin Ointment 1 x Per Day/30 Days Discharge Instructions: Apply Mupirocin (Bactroban) as instructed Prim Dressing: Maxorb Extra Ag+ Alginate Dressing, 4x4.75 (in/in) 1 x Per Day/30 Days ary Discharge Instructions: Apply to wound bed as instructed Secondary Dressing: ALLEVYN Heel 4 1/2in x 5 1/2in / 10.5cm x 13.5cm 1 x Per Day/30 Days Discharge Instructions: Apply over primary dressing as directed. Secondary Dressing: Woven Gauze Sponge, Non-Sterile 4x4 in 1 x Per Day/30 Days Discharge Instructions: Apply over primary dressing as directed. Secured With: Coban Self-Adherent Wrap 4x5 (in/yd) 1 x Per Day/30 Days Discharge Instructions: Secure with Coban as directed. [name], [name] ([id number]) [id number] Page 5 of 11 Secured With: [employer], 4.5x3.1 (in/yd) 1 x Per Day/30 Days Discharge Instructions: Secure with Kerlix as directed. Electronic Signature(s) Signed: 03/07/2023 12:18:26 PM By: Marisa Guess MD FACS Entered By: [name] on 03/07/2023 12:09:32 -------------------------------------------------------------------------------- Problem List Details Patient Name: Date of Service: Marisa Roux E. 03/07/2023 11:00 A M Medical Record Number: [id number] Patient Account Number: [id number] Date of Birth/Sex: Treating RN: [birth year] (72  y.o. F) Primary Care Provider: Saralyn Cooper Other Clinician: Referring Provider: Treating Provider/Extender: [name] in Treatment: 25 Active Problems ICD-10 Encounter Code Description Active Date MDM Diagnosis L89.314 Pressure ulcer of right buttock, stage 4 09/07/2022 No Yes L89.614 Pressure ulcer of right heel, stage 4 09/28/2022 No Yes I73.00 Raynaud's syndrome without gangrene 09/07/2022  No Yes I50.32 Chronic diastolic (congestive) heart failure 09/07/2022 No Yes N18.32 Chronic kidney disease, stage 3b 09/07/2022 No Yes R73.02 Impaired glucose tolerance (oral) 09/07/2022 No Yes E66.9 Obesity, unspecified 09/07/2022 No Yes Inactive Problems ICD-10 Code Description Active Date Inactive Date L89.313 Pressure ulcer of right buttock, stage 3 09/07/2022 09/07/2022 Resolved Problems Electronic Signature(s) Signed: 03/07/2023 12:06:43 PM By: Marisa Guess MD FACS Entered By: [name] on 03/07/2023 12:06:43 [name] ([id number]) [id number] Page 6 of 11 -------------------------------------------------------------------------------- Progress Note Details Patient Name: Date of Service: [name], [name] 03/07/2023 11:00 A M Medical Record Number: [id number] Patient Account Number: [id number] Date of Birth/Sex: Treating RN: [birth year] (72 y.o. F) Primary Care Provider: Saralyn Cooper Other Clinician: Referring Provider: Treating Provider/Extender: [name] in Treatment: 25 Subjective Chief Complaint Information obtained from Patient Patient is at the clinic for treatment of open pressure ulcers History of Present Illness (HPI) ADMISSION 09/07/2023 This is a 72 year old woman with a past medical history notable for obesity, congestive heart failure, Raynaud's syndrome, CKD stage IIIb, osteoporosis, and rheumatoid arthritis. In November 2023, she suffered a fall that resulted in a  femur fracture. She was hospitalized for about a week and underwent surgical repair of the fracture. She subsequently developed pressure ulcers on her right buttocks and ischium. She has been receiving home health services and they have been applying Medihoney. They have been reporting the wounds as stage II, but on evaluation, the large ulcer was probably unstageable at the time they were evaluating it but it is clearly a stage IV at this point. The smaller ulcer does have fat layer exposure and therefore is a stage III. The patient is accompanied by her daughter. She says she has been sleeping on a regular bed but recently ordered an air mattress T opper, but does not have it yet. She is on Ozempic for weight loss and therefore has a poor appetite and struggles to get adequate protein intake. 09/15/2022: The large stage IV ulcer is substantially cleaner this week. The stage III ulcer is a little smaller. Both have slough accumulation. The culture that I took last week was polymicrobial. The Augmentin that I prescribed was adequate coverage for the species and she continues to take this. We have also ordered Keystone topical antibiotic compound, but this has not yet arrived. 09/21/2022: The stage IV ulcer has some necrotic muscle at the base but is otherwise fairly clean. The stage III ulcer is smaller again with light slough on the surface. She has her Keystone topical antibiotic compound with her today. 09/29/2022: There is still some necrotic muscle at the base of the stage IV ulcer that I was unable to get to last week. The stage III ulcer has healed. She unfortunately has developed a new pressure ulcer on her right heel. 10/06/2022: Still with some devitalized muscle at the base of the stage IV ulcer, but otherwise this wound is looking quite clean. The pressure induced tissue injury on her heel is demarcating and much of the area appears to be epithelialized, but there is still 2 areas that remain  questionable. 10/12/2022: The stage IV ulcer is very clean and ready for wound VAC, which will be delivered tomorrow according to the patient. The tissue injury on her heel is drying up and does not feel particularly boggy this week. 10/19/2022: The heel injury continues to improve. There is still some dry eschar present on the more plantar aspect of it. There is some slough accumulation on the  surface of the stage IV pressure ulcer. The wound VAC was initiated by home health last week. 11/01/2022: The gluteal pressure ulcer is very clean without any necrotic tissue or slough. The depth has come in by over half a centimeter. The deep tissue injury on her heel continues to improve. There is still dry eschar that we are painting with Betadine as well as some fresh-looking viable tissue at the margin. 11/15/2022: The gluteal pressure ulcer is clean and contracting. The eschar on her heel ulcer is beginning to separate from the underlying tissue. 11/29/2022: The gluteal pressure ulcer is very clean. The depth has come in by about a centimeter. The eschar has completely separated off of her heel. There is some fibrinous exudate and slough on the heel. It has declared itself as a stage III at this point. 12/13/2022: The heel ulcer is smaller and much cleaner, but still has some non-viable tissue present. The orifice of the gluteal ulcer is contracting, but the depth remains the same. Fortunately, the sponge for the [facility] is being packed appropriately into the full depth of the wound. 12/27/2022: The heel ulcer is smaller, but I think the depth and degree of tissue injury has finally declared itself, with tendon being exposed at the posterior aspect of the ulcer. It is now stage IV. The gluteal ulcer continues to contract circumferentially, but the depth is still unchanged. 01/10/2023: The heel ulcer has contracted further. There is still rubbery slough on the surface. There has been no further tissue breakdown; I think  the last of the nonviable tissue was removed at her visit 2 weeks ago. The gluteal ulcer has contracted further circumferentially, but there is still a tunnel that ankles off cranially that remains about the same depth. The tissues appear viable at both sites. 01/24/2023: Unfortunately, the home health nurse that was applying the wound VAC did not put any drape on her skin for the bridge and was applying sponge directly to the patient's skin. This is resulted in significant tissue breakdown and pain for the patient. Once we were notified of this, we discontinued the wound VAC and they have been packing the wound with saline moistened gauze. The heel has also deteriorated. Bone is now exposed. 01/30/2023: Her skin looks significantly better this week. It has recovered from the insult caused by applying the sponge directly to it. Her heel looks a little bit better as well. The culture that I took last week grew out group A strep, Proteus mirabilis, and Enterococcus, along with other skin flora. Although Levaquin was the recommended antibiotic, she is on amiodarone and therefore Levaquin is contraindicated. She has been taking Augmentin and is showing signs of improvement. 02/07/2023: The insult to her skin caused by direct application of wound VAC sponge has nearly completely healed. The gluteal ulcer cavity continues to contract circumferentially, but still probes quite deeply; I suspect the sponge for her VAC is not getting placed completely down into that tunnel. The heel is looking better today. There is more granulation filling in. I did not feel bone today. 02/14/2023: The gluteal ulcer cavity has circumferentially contracted even further, although the depth remains the same. The heel has improved and there is good tissue overlying where the bone had been exposed. Both wounds have some slough on the surface. [name], [name] ([id number]) [id number] Page 7 of 11 02/21/2023: The  gluteal ulcer cavity continues to contract and is about half a centimeter shallower this week. The surface is clean without any slough or  other debris accumulation. The heel continues to fill in granulation tissue over where the bone had been exposed. There is some slough on the wound surface. 03/07/2023: The gluteal ulcer was measured slightly deeper today, but on palpation, it feels roughly the same. The cavity is certainly more contracted. She was complaining of more pain in the area and it appears that GranuFoam was applied directly to her skin and she has some irritation from this. The heel ulcer is filling in with excellent granulation tissue. It does not probe to bone any longer. Minimal slough accumulation. Patient History Information obtained from Patient, Caregiver, Chart. Family History Cancer - Father,Paternal Grandparents, Heart Disease - Mother,Father,Paternal Grandparents, Hypertension - Mother, Stroke - Maternal Grandparents, No family history of Diabetes, Hereditary Spherocytosis, Kidney Disease, Lung Disease, Seizures, Thyroid Problems, Tuberculosis. 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 Medical History Eyes Denies history of Cataracts, Glaucoma, Optic Neuritis Ear/Nose/Mouth/Throat Patient has history of Chronic sinus problems/congestion - chronic rhinitis Denies history of Middle ear problems Respiratory Patient has history of Sleep Apnea - uses CPAP Cardiovascular Patient has history of Congestive Heart Failure Endocrine Denies history of Type I Diabetes, Type II Diabetes Genitourinary Denies history of End Stage Renal Disease Immunological Patient has history of Raynauds Denies history of Lupus Erythematosus, Scleroderma Integumentary (Skin) Denies history of History of Burn Musculoskeletal Patient has history of Rheumatoid Arthritis Denies history of Gout,  Osteoarthritis, Osteomyelitis Neurologic Patient has history of Neuropathy Denies history of Dementia, Quadriplegia, Paraplegia, Seizure Disorder Oncologic Denies history of Received Chemotherapy, Received Radiation Psychiatric Denies history of Anorexia/bulimia, Confinement Anxiety Hospitalization/Surgery History - right femur fx ORIR. - right knee replacement. - left breast mass excision. - left ankle tibia repair. - abdominal hysterectomy. - adnoidectomy/tonsillectomy. - cholecystectomy. - dental implants. - patoid cystectomy. - tubal ligation. Medical A Surgical History Notes nd Constitutional Symptoms (General Health) morbid obesity Cardiovascular hyperlipidemia Gastrointestinal GERD, IBS, eosinophilic esophagitis, diverticulosis, Genitourinary CKD stage 3 Immunological Sjoegren syndrome, fibromyalgia Objective Constitutional no acute distress. Vitals Time Taken: 11:20 AM, Height: 62 in, Weight: 207 lbs, BMI: 37.9, Temperature: 98.7 F, Pulse: 64 bpm, Respiratory Rate: 18 breaths/min, Blood Pressure: 122/54 mmHg. Respiratory Normal work of breathing on room air. General Notes: 03/07/2023: The gluteal ulcer was measured slightly deeper today, but on palpation, it feels roughly the same. The cavity is certainly more contracted. She was complaining of more pain in the area and it appears that GranuFoam was applied directly to her skin and she has some irritation from this. The heel ulcer is filling in with excellent granulation tissue. It does not probe to bone any longer. Minimal slough accumulation. [name], [name] ([id number]) [id number] Page 8 of 11 Integumentary (Hair, Skin) Wound #1 status is Open. Original cause of wound was Pressure Injury. The date acquired was: 08/11/2022. The wound has been in treatment 25 weeks. The wound is located on the Right Gluteus. The wound measures 1.5cm length x 3cm width x 11cm depth; 3.534cm^2 area and 38.877cm^3  volume. There is Fat Layer (Subcutaneous Tissue) exposed. There is a large amount of serosanguineous drainage noted. The wound margin is well defined and not attached to the wound base. There is large (67-100%) red granulation within the wound bed. There is no necrotic tissue within the wound bed. The periwound skin appearance had no abnormalities noted for moisture. The periwound skin appearance had no abnormalities noted for color. The periwound  skin appearance did not exhibit: Callus, Crepitus, Excoriation, Induration, Rash, Scarring. Periwound temperature was noted as No Abnormality. The periwound has tenderness on palpation. Wound #3 status is Open. Original cause of wound was Pressure Injury. The date acquired was: 09/21/2022. The wound has been in treatment 22 weeks. The wound is located on the Right Calcaneus. The wound measures 1.7cm length x 3.9cm width x 1cm depth; 5.207cm^2 area and 5.207cm^3 volume. There is a medium amount of serosanguineous drainage noted. 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 There was a Selective/Open Wound Non-Viable Tissue Debridement with a total area of 5.2 sq cm performed by Marisa Guess, MD. With the following instrument(s): Curette Material removed includes [facility] after achieving pain control using Lidocaine 4% T opical Solution. No specimens were taken. A time out was conducted at 12:01, prior to the start of the procedure. A Minimum amount of bleeding was controlled with Pressure. The procedure was tolerated well with a pain level of 0 throughout and a pain level of 0 following the procedure. Post  Debridement Measurements: 1.7cm length x 3.9cm width x 0.5cm depth; 2.604cm^3 volume. Post debridement Stage noted as Category/Stage IV. Character of Wound/Ulcer Post Debridement is improved. Post procedure Diagnosis Wound #3: Same as Pre-Procedure General Notes: Scribed for Dr [name] by Marisa Grills RN. Plan Follow-up Appointments: Return Appointment in 2 weeks. - Dr [name] Rm 3 Anesthetic: Wound #1 Right Gluteus: (In clinic) Topical Lidocaine 4% applied to wound bed Bathing/ Shower/ Hygiene: May shower and wash wound with soap and water. Negative Presssure Wound Therapy: Wound #1 Right Gluteus: Wound Vac to wound continuously at 133mm/hg pressure - resume changes 3 times per week Black Foam Off-Loading: Heel suspension boot to: - globoped right foot to ambulate Low air-loss mattress (Group 2) - Adapt Turn and reposition every 2 hours - avoid lying on back, stand at least every hour while out of bed, float heels off bed with pillows under calves while in bed Prevalon Boot - right foot esp. while in bed. Additional Orders / Instructions: Follow Nutritious Diet - increase protein to 70-80 gms per day, hold ozempic Juven Shake 1-2 times daily. Home Health: New wound care orders this week; continue Home Health for wound care. May utilize formulary equivalent dressing for wound treatment orders unless otherwise specified. - resume VAC, bridge up toward back, prisma to base of wound then place black foam Duoderm to periwound under drape to protect skin Other Home Health Orders/Instructions: - [facility] WOUND #1: - Gluteus Wound Laterality: Right Cleanser: Wound Cleanser 3 x Per Week/30 Days Discharge Instructions: Cleanse the wound with wound cleanser prior to applying a clean dressing using gauze sponges, not tissue or cotton balls. Prim Dressing: Promogran Prisma Matrix, 4.34 (sq in) (silver collagen) 3 x Per Week/30 Days ary Discharge Instructions: Moisten collagen with saline or  hydrogel Prim Dressing: VAC 3 x Per Week/30 Days ary Secondary Dressing: Duoderm 3 x Per Week/30 Days Discharge Instructions: apply to periwound, under vac drape WOUND #3: - Calcaneus Wound Laterality: Right Peri-Wound Care: Zinc Oxide Ointment 30g tube 1 x Per Day/30 Days Discharge Instructions: Apply Zinc Oxide to periwound with each dressing change Topical: Gentamicin 1 x Per Day/30 Days Discharge Instructions: mix 50:50  with mupirocin and rub into wound bed [name], [name] ([id number]) [phone].pdf Page 9 of 11 Topical: Mupirocin Ointment 1 x Per Day/30 Days Discharge Instructions: Apply Mupirocin (Bactroban) as instructed Prim Dressing: Maxorb Extra Ag+ Alginate Dressing, 4x4.75 (in/in) 1 x Per Day/30 Days ary Discharge Instructions: Apply to wound bed as instructed Secondary Dressing: ALLEVYN Heel 4 1/2in x 5 1/2in / 10.5cm x 13.5cm 1 x Per Day/30 Days Discharge Instructions: Apply over primary dressing as directed. Secondary Dressing: Woven Gauze Sponge, Non-Sterile 4x4 in 1 x Per Day/30 Days Discharge Instructions: Apply over primary dressing as directed. Secured With: Coban Self-Adherent Wrap 4x5 (in/yd) 1 x Per Day/30 Days Discharge Instructions: Secure with Coban as directed. Secured With: [employer], 4.5x3.1 (in/yd) 1 x Per Day/30 Days Discharge Instructions: Secure with Kerlix as directed. 03/07/2023: The gluteal ulcer was measured slightly deeper today, but on palpation, it feels roughly the same. The cavity is certainly more contracted. She was complaining of more pain in the area and it appears that GranuFoam was applied directly to her skin and she has some irritation from this. The heel ulcer is filling in with excellent granulation tissue. It does not probe to bone any longer. Minimal slough accumulation. The gluteal ulcer did not require any debridement today. We will continue to apply Prisma silver collagen at the base and continue  negative pressure wound therapy. We will apply some DuoDERM over the skin at the area where the foam caused some irritation and breakdown. We will also communicate this to the home health agency that has been performing her dressing changes. I debrided slough from the heel ulcer. We will continue topical gentamicin mixed with mupirocin and silver alginate with a foam heel cup. She was reminded of the importance of offloading both wound sites. Follow-up in 2 weeks. Electronic Signature(s) Signed: 03/07/2023 12:10:49 PM By: Marisa Guess MD FACS Entered By: [name] on 03/07/2023 12:10:49 -------------------------------------------------------------------------------- HxROS Details Patient Name: Date of Service: Marisa Roux E. 03/07/2023 11:00 A M Medical Record Number: [id number] Patient Account Number: [id number] Date of Birth/Sex: Treating RN: 1951-03-25 (72 y.o. F) Primary Care Provider: Saralyn Cooper Other Clinician: Referring Provider: Treating Provider/Extender: [name] in Treatment: 25 Information Obtained From Patient Caregiver Chart Constitutional Symptoms (General Health) Medical History: Past Medical History Notes: morbid obesity Eyes Medical History: Negative for: Cataracts; Glaucoma; Optic Neuritis Ear/Nose/Mouth/Throat Medical History: Positive for: Chronic sinus problems/congestion - chronic rhinitis Negative for: Middle ear problems Respiratory Medical History: Positive for: Sleep Apnea - uses CPAP Cardiovascular Medical History: Positive for: Congestive Heart Failure Past Medical History Notes: hyperlipidemia [name], [name] ([id number]) [id number] Page 10 of 11 Gastrointestinal Medical History: Past Medical History Notes: GERD, IBS, eosinophilic esophagitis, diverticulosis, Endocrine Medical History: Negative for: Type I Diabetes; Type II Diabetes Genitourinary Medical  History: Negative for: End Stage Renal Disease Past Medical History Notes: CKD stage 3 Immunological Medical History: Positive for: Raynauds Negative for: Lupus Erythematosus; Scleroderma Past Medical History Notes: Sjoegren syndrome, fibromyalgia Integumentary (Skin) Medical History: Negative for: History of Burn Musculoskeletal Medical History: Positive for: Rheumatoid Arthritis Negative for: Gout; Osteoarthritis; Osteomyelitis Neurologic Medical History: Positive for: Neuropathy Negative for: Dementia; Quadriplegia; Paraplegia; Seizure Disorder Oncologic Medical History: Negative for: Received Chemotherapy; Received Radiation Psychiatric Medical History: Negative for: Anorexia/bulimia; Confinement Anxiety HBO Extended History Items Ear/Nose/Mouth/Throat: Chronic sinus problems/congestion Immunizations Pneumococcal Vaccine: Received Pneumococcal Vaccination: Yes Received Pneumococcal Vaccination On or After 60th Birthday: Yes Implantable Devices None Hospitalization / Surgery History Type of Hospitalization/Surgery right  femur fx ORIR right knee replacement left breast mass excision left ankle tibia repair abdominal hysterectomy adnoidectomy/tonsillectomy cholecystectomy dental implants patoid cystectomy tubal ligation [name], [name] ([id number]) [id number] Page 11 of 17 Family and Social History Cancer: Yes - Father,Paternal Grandparents; Diabetes: No; Heart Disease: Yes - Mother,Father,Paternal Grandparents; Hereditary Spherocytosis: No; Hypertension: Yes - Mother; Kidney Disease: No; Lung Disease: No; Seizures: No; Stroke: Yes - Maternal Grandparents; Thyroid Problems: No; Tuberculosis: No; Never smoker; Marital Status - Widowed; Alcohol Use: Rarely; Drug Use: Prior History - TCH; Caffeine Use: Daily - soda, tea; Financial Concerns: No; Food, Clothing or Shelter Needs: No; Support System Lacking: No; Transportation Concerns:  No Electronic Signature(s) Signed: 03/07/2023 12:18:26 PM By: Marisa Guess MD FACS Entered By: [name] on 03/07/2023 12:08:45 -------------------------------------------------------------------------------- SuperBill Details Patient Name: Date of Service: [name] 03/07/2023 Medical Record Number: [id number] Patient Account Number: [id number] Date of Birth/Sex: Treating RN: Sep 27, 1950 (72 y.o. [name] Primary Care Provider: Saralyn Cooper Other Clinician: Referring Provider: Treating Provider/Extender: [name] in Treatment: 25 Diagnosis Coding ICD-10 Codes Code Description L89.314 Pressure ulcer of right buttock, stage 4 L89.614 Pressure ulcer of right heel, stage 4 I73.00 Raynaud's syndrome without gangrene I50.32 Chronic diastolic (congestive) heart failure N18.32 Chronic kidney disease, stage 3b R73.02 Impaired glucose tolerance (oral) E66.9 Obesity, unspecified Facility Procedures : CPT4 Code: [id number] Description: 97597 - DEBRIDE WOUND 1ST 20 SQ CM OR < ICD-10 Diagnosis Description L89.614 Pressure ulcer of right heel, stage 4 Modifier: Quantity: 1 : CPT4 Code: [id number] Description: 97607 NEG PRESS WND TX <=50 SQ CM Modifier: Quantity: 1 Physician Procedures : CPT4 Code Description Modifier [id number] - WC PHYS LEVEL 4 - EST PT 25 ICD-10 Diagnosis Description L89.314 Pressure ulcer of right buttock, stage 4 L89.614 Pressure ulcer of right heel, stage 4 E66.9 Obesity, unspecified I73.00 Raynaud's syndrome  without gangrene Quantity: 1 : [id number] 97597 - WC PHYS DEBR WO ANESTH 20 SQ CM ICD-10 Diagnosis Description L89.614 Pressure ulcer of right heel, stage 4 Quantity: 1 Electronic Signature(s) Signed: 03/07/2023 12:11:17 PM By: Marisa Guess MD FACS Entered By: [name] on 03/07/2023 12:11:17

## 2023-03-09 ENCOUNTER — Other Ambulatory Visit (INDEPENDENT_AMBULATORY_CARE_PROVIDER_SITE_OTHER): Payer: Self-pay | Admitting: Family Medicine

## 2023-03-09 ENCOUNTER — Other Ambulatory Visit: Payer: Self-pay | Admitting: Adult Health

## 2023-03-09 DIAGNOSIS — J3089 Other allergic rhinitis: Secondary | ICD-10-CM

## 2023-03-12 ENCOUNTER — Other Ambulatory Visit (INDEPENDENT_AMBULATORY_CARE_PROVIDER_SITE_OTHER): Payer: Self-pay | Admitting: Nurse Practitioner

## 2023-03-12 ENCOUNTER — Other Ambulatory Visit (INDEPENDENT_AMBULATORY_CARE_PROVIDER_SITE_OTHER): Payer: Self-pay | Admitting: Family Medicine

## 2023-03-12 DIAGNOSIS — J3089 Other allergic rhinitis: Secondary | ICD-10-CM

## 2023-03-13 ENCOUNTER — Telehealth: Payer: Self-pay | Admitting: Adult Health

## 2023-03-13 DIAGNOSIS — K219 Gastro-esophageal reflux disease without esophagitis: Secondary | ICD-10-CM | POA: Diagnosis not present

## 2023-03-13 DIAGNOSIS — M6281 Muscle weakness (generalized): Secondary | ICD-10-CM | POA: Diagnosis not present

## 2023-03-13 DIAGNOSIS — K580 Irritable bowel syndrome with diarrhea: Secondary | ICD-10-CM | POA: Diagnosis not present

## 2023-03-13 DIAGNOSIS — K5730 Diverticulosis of large intestine without perforation or abscess without bleeding: Secondary | ICD-10-CM | POA: Diagnosis not present

## 2023-03-13 DIAGNOSIS — I5022 Chronic systolic (congestive) heart failure: Secondary | ICD-10-CM | POA: Diagnosis not present

## 2023-03-13 DIAGNOSIS — S72409D Unspecified fracture of lower end of unspecified femur, subsequent encounter for closed fracture with routine healing: Secondary | ICD-10-CM | POA: Diagnosis not present

## 2023-03-13 DIAGNOSIS — K9089 Other intestinal malabsorption: Secondary | ICD-10-CM | POA: Diagnosis not present

## 2023-03-13 DIAGNOSIS — R2689 Other abnormalities of gait and mobility: Secondary | ICD-10-CM | POA: Diagnosis not present

## 2023-03-13 MED ORDER — GABAPENTIN 300 MG PO CAPS: 600.0000 mg | ORAL_CAPSULE | Freq: Two times a day (BID) | ORAL | 1 refills | Status: DC

## 2023-03-13 NOTE — Telephone Encounter (Addendum)
Rx  refill sent to pharmacy this afternoon

## 2023-03-13 NOTE — Telephone Encounter (Signed)
Pt called needing a refill request for her  gabapentin (NEURONTIN) 300 MG capsule sent in to the [facility]

## 2023-03-14 DIAGNOSIS — S71009A Unspecified open wound, unspecified hip, initial encounter: Secondary | ICD-10-CM | POA: Diagnosis not present

## 2023-03-14 DIAGNOSIS — I5032 Chronic diastolic (congestive) heart failure: Secondary | ICD-10-CM | POA: Diagnosis not present

## 2023-03-14 DIAGNOSIS — N1831 Chronic kidney disease, stage 3a: Secondary | ICD-10-CM | POA: Diagnosis not present

## 2023-03-14 DIAGNOSIS — L89314 Pressure ulcer of right buttock, stage 4: Secondary | ICD-10-CM | POA: Diagnosis not present

## 2023-03-14 DIAGNOSIS — L89313 Pressure ulcer of right buttock, stage 3: Secondary | ICD-10-CM | POA: Diagnosis not present

## 2023-03-14 DIAGNOSIS — N1832 Chronic kidney disease, stage 3b: Secondary | ICD-10-CM | POA: Diagnosis not present

## 2023-03-14 DIAGNOSIS — R7302 Impaired glucose tolerance (oral): Secondary | ICD-10-CM | POA: Diagnosis not present

## 2023-03-14 DIAGNOSIS — E876 Hypokalemia: Secondary | ICD-10-CM | POA: Diagnosis not present

## 2023-03-14 DIAGNOSIS — S71109A Unspecified open wound, unspecified thigh, initial encounter: Secondary | ICD-10-CM | POA: Diagnosis not present

## 2023-03-14 LAB — BASIC METABOLIC PANEL
BUN: 17 (ref 4–21)
CO2: 23 — AB (ref 13–22)
Chloride: 107 (ref 99–108)
Creatinine: 1 (ref 0.5–1.1)
Glucose: 126
Potassium: 3.9 mEq/L (ref 3.5–5.1)
Sodium: 139 (ref 137–147)

## 2023-03-14 LAB — IRON,TIBC AND FERRITIN PANEL
Ferritin: 119
Iron: 25
TIBC: 278
UIBC: 253

## 2023-03-14 LAB — COMPREHENSIVE METABOLIC PANEL
Albumin: 3.8 (ref 3.5–5.0)
Calcium: 9.1 (ref 8.7–10.7)

## 2023-03-14 LAB — CBC AND DIFFERENTIAL: Hemoglobin: 10.4 — AB (ref 12.0–16.0)

## 2023-03-15 DIAGNOSIS — L89314 Pressure ulcer of right buttock, stage 4: Secondary | ICD-10-CM | POA: Diagnosis not present

## 2023-03-20 ENCOUNTER — Other Ambulatory Visit: Payer: Self-pay | Admitting: Nurse Practitioner

## 2023-03-20 DIAGNOSIS — S7291XD Unspecified fracture of right femur, subsequent encounter for closed fracture with routine healing: Secondary | ICD-10-CM

## 2023-03-20 DIAGNOSIS — M79604 Pain in right leg: Secondary | ICD-10-CM

## 2023-03-20 DIAGNOSIS — L89314 Pressure ulcer of right buttock, stage 4: Secondary | ICD-10-CM | POA: Diagnosis not present

## 2023-03-21 ENCOUNTER — Encounter (HOSPITAL_BASED_OUTPATIENT_CLINIC_OR_DEPARTMENT_OTHER): Payer: PPO | Attending: General Surgery | Admitting: General Surgery

## 2023-03-21 DIAGNOSIS — R7302 Impaired glucose tolerance (oral): Secondary | ICD-10-CM | POA: Insufficient documentation

## 2023-03-21 DIAGNOSIS — G629 Polyneuropathy, unspecified: Secondary | ICD-10-CM | POA: Insufficient documentation

## 2023-03-21 DIAGNOSIS — N1832 Chronic kidney disease, stage 3b: Secondary | ICD-10-CM | POA: Insufficient documentation

## 2023-03-21 DIAGNOSIS — L89614 Pressure ulcer of right heel, stage 4: Secondary | ICD-10-CM | POA: Diagnosis not present

## 2023-03-21 DIAGNOSIS — Z6837 Body mass index (BMI) 37.0-37.9, adult: Secondary | ICD-10-CM | POA: Insufficient documentation

## 2023-03-21 DIAGNOSIS — Z7985 Long-term (current) use of injectable non-insulin antidiabetic drugs: Secondary | ICD-10-CM | POA: Insufficient documentation

## 2023-03-21 DIAGNOSIS — E785 Hyperlipidemia, unspecified: Secondary | ICD-10-CM | POA: Diagnosis not present

## 2023-03-21 DIAGNOSIS — M069 Rheumatoid arthritis, unspecified: Secondary | ICD-10-CM | POA: Insufficient documentation

## 2023-03-21 DIAGNOSIS — I5032 Chronic diastolic (congestive) heart failure: Secondary | ICD-10-CM | POA: Insufficient documentation

## 2023-03-21 DIAGNOSIS — K219 Gastro-esophageal reflux disease without esophagitis: Secondary | ICD-10-CM | POA: Insufficient documentation

## 2023-03-21 DIAGNOSIS — G4730 Sleep apnea, unspecified: Secondary | ICD-10-CM | POA: Diagnosis not present

## 2023-03-21 DIAGNOSIS — I7300 Raynaud's syndrome without gangrene: Secondary | ICD-10-CM | POA: Diagnosis not present

## 2023-03-21 DIAGNOSIS — L89314 Pressure ulcer of right buttock, stage 4: Secondary | ICD-10-CM | POA: Diagnosis not present

## 2023-03-22 NOTE — Progress Notes (Signed)
[name], [name] ([id number]) [phone].pdf Page 1 of 9 Visit Report for 03/21/2023 Arrival Information Details Patient Name: Date of Service: Marisa, Cooper 03/21/2023 11:15 A M Medical Record Number: [id number] Patient Account Number: [id number] Date of Birth/Sex: Treating RN: [birth year] (72 y.o. [name] Primary Care : Saralyn Pilar Other Clinician: Referring : Treating /Extender: Greer Pickerel in Treatment: 27 Visit Information History Since Last Visit Added or deleted any medications: No Patient Arrived: Walker Any new allergies or adverse reactions: No Arrival Time: 11:24 Had a fall or experienced change in No Accompanied By: son activities of daily living that may affect Transfer Assistance: None risk of falls: Patient Identification Verified: Yes Signs or symptoms of abuse/neglect since last visito No Secondary Verification Process Completed: Yes Hospitalized since last visit: No Patient Requires Transmission-Based Precautions: No Implantable device outside of the clinic excluding No Patient Has Alerts: No cellular tissue based products placed in the center since last visit: Has Dressing in Place as Prescribed: Yes Has Compression in Place as Prescribed: Yes Pain Present Now: Yes Electronic Signature(s) Signed: 03/22/2023 12:03:42 PM By: Zenaida Deed RN, BSN Entered By: Zenaida Deed on 03/21/2023 11:25:21 -------------------------------------------------------------------------------- Lower Extremity Assessment Details Patient Name: Date of Service: [name], [name] 03/21/2023 11:15 A M Medical Record Number: [id number] Patient Account Number: [id number] Date of Birth/Sex: Treating RN: 1950-11-04 (72 y.o. [name] Primary Care : Saralyn Pilar Other Clinician: Referring : Treating /Extender: Greer Pickerel in Treatment:  27 Edema Assessment Assessed: [Left: No] [Right: No] Edema: [Left: Ye] [Right: s] Calf Left: Right: Point of Measurement: From Medial Instep 30 cm Ankle Left: Right: Point of Measurement: From Medial Instep 24 cm Vascular Assessment Pulses: Dorsalis Pedis Palpable: [Right:Yes] [name], [name] Cooper ([id number]) [Right:[id number] Page 2 of 9] Extremity colors, hair growth, and conditions: Extremity Color: [Right:Normal] Hair Growth on Extremity: [Right:Yes] Temperature of Extremity: [Right:Warm] Capillary Refill: [Right:< 3 seconds] Dependent Rubor: [Right:No No] Electronic Signature(s) Signed: 03/22/2023 12:03:42 PM By: Zenaida Deed RN, BSN Entered By: Zenaida Deed on 03/21/2023 11:34:28 -------------------------------------------------------------------------------- Multi Wound Chart Details Patient Name: Date of Service: [name]. 03/21/2023 11:15 A M Medical Record Number: [id number] Patient Account Number: [id number] Date of Birth/Sex: Treating RN: Jul 27, 1951 (72 y.o. F) Primary Care : Saralyn Pilar Other Clinician: Referring : Treating /Extender: Greer Pickerel in Treatment: 27 Vital Signs Height(in): 62 Pulse(bpm): 31 Weight(lbs): 207 Blood Pressure(mmHg): 132/63 Body Mass Index(BMI): 37.9 Temperature(F): 98.2 Respiratory Rate(breaths/min): 20 [1:Photos:] [N/A:N/A] Right Gluteus Right Calcaneus N/A Wound Location: Pressure Injury Pressure Injury N/A Wounding Event: Pressure Ulcer Pressure Ulcer N/A Primary Etiology: Chronic sinus problems/congestion, Chronic sinus problems/congestion, N/A Comorbid History: Sleep Apnea, Congestive Heart Sleep Apnea, Congestive Heart Failure, Raynauds, Rheumatoid Failure, Raynauds, Rheumatoid Arthritis, Neuropathy Arthritis, Neuropathy 08/11/2022 09/21/2022 N/A Date Acquired: 27 24 N/A Weeks of Treatment: Open Open N/A Wound Status: No No  N/A Wound Recurrence: 2.4x3.1x6.3 1.1x3.2x0.6 N/A Measurements L x W x D (cm) 5.843 2.765 N/A A (cm) : rea 36.813 1.659 N/A Volume (cm) : 73.40% 71.40% N/A % Reduction in A rea: 72.10% -71.70% N/A % Reduction in Volume: 1 Position 1 (o'clock): 9.3 Maximum Distance 1 (cm): Yes No N/A Tunneling: Category/Stage IV Category/Stage IV N/A Classification: Large Medium N/A Exudate A mount: Serosanguineous Serosanguineous N/A Exudate Type: red, brown red, brown N/A Exudate Color: Well defined, not attached Distinct, outline attached N/A Wound Margin: Large (67-100%) Large (67-100%) N/A Granulation A mount: Red Red, Pale N/A Granulation Quality:  None Present (0%) Small (1-33%) N/A Necrotic A mount: Fat Layer (Subcutaneous Tissue): Yes Fat Layer (Subcutaneous Tissue): Yes N/A Exposed Structures: Fascia: No Fascia: No Tendon: No Tendon: No [name], [name] ([id number]) [id number].pdf Page 3 of 9 Muscle: No Muscle: No Joint: No Joint: No Bone: No Bone: No Small (1-33%) Small (1-33%) N/A Epithelialization: N/A Debridement - Selective/Open Wound N/A Debridement: Pre-procedure Verification/Time Out N/A 11:55 N/A Taken: N/A Lidocaine 4% Topical Solution N/A Pain Control: N/A Slough N/A Tissue Debrided: N/A Non-Viable Tissue N/A Level: N/A 2.76 N/A Debridement A (sq cm): rea N/A Curette N/A Instrument: N/A Minimum N/A Bleeding: N/A Pressure N/A Hemostasis A chieved: N/A 0 N/A Procedural Pain: N/A 0 N/A Post Procedural Pain: N/A Procedure was tolerated well N/A Debridement Treatment Response: N/A 1.1x3.2x0.6 N/A Post Debridement Measurements L x W x D (cm) N/A 1.659 N/A Post Debridement Volume: (cm) N/A Category/Stage IV N/A Post Debridement Stage: Excoriation: No No Abnormalities Noted N/A Periwound Skin Texture: Induration: No Callus: No Crepitus: No Rash: No Scarring: No Maceration: No No Abnormalities Noted N/A Periwound  Skin Moisture: Dry/Scaly: No Atrophie Blanche: No No Abnormalities Noted N/A Periwound Skin Color: Cyanosis: No Ecchymosis: No Erythema: No Hemosiderin Staining: No Mottled: No Pallor: No Rubor: No No Abnormality No Abnormality N/A Temperature: Yes Yes N/A Tenderness on Palpation: Negative Pressure Wound Therapy Debridement N/A Procedures Performed: Maintenance (NPWT) Treatment Notes Electronic Signature(s) Signed: 03/21/2023 12:05:04 PM By: Duanne Guess MD FACS Entered By: Duanne Guess on 03/21/2023 12:05:04 -------------------------------------------------------------------------------- Multi-Disciplinary Care Plan Details Patient Name: Date of Service: [name]. 03/21/2023 11:15 A M Medical Record Number: [id number] Patient Account Number: [id number] Date of Birth/Sex: Treating RN: Mar 10, 1951 (72 y.o. [name] Primary Care : Saralyn Pilar Other Clinician: Referring : Treating /Extender: Greer Pickerel in Treatment: 27 Multidisciplinary Care Plan reviewed with physician Active Inactive Pressure Nursing Diagnoses: Knowledge deficit related to management of pressures ulcers Goals: Patient will remain free of pressure ulcers Date Initiated: 09/15/2022 Date Inactivated: 09/28/2022 Target Resolution Date: 11/09/2022 Unmet Reason: new pressure ulcer Goal Status: Unmet right heel [name], [name] ([id number]) [phone].pdf Page 4 of 9 Patient/caregiver will verbalize risk factors for pressure ulcer development Date Initiated: 09/15/2022 Target Resolution Date: 04/05/2023 Goal Status: Active Interventions: Assess potential for pressure ulcer upon admission and as needed Treatment Activities: Patient referred for pressure reduction/relief devices : 09/15/2022 Pressure reduction/relief device ordered : 09/15/2022 Notes: Wound/Skin Impairment Nursing Diagnoses: Impaired tissue  integrity Knowledge deficit related to ulceration/compromised skin integrity Goals: Patient/caregiver will verbalize understanding of skin care regimen Date Initiated: 09/28/2022 Target Resolution Date: 04/05/2023 Goal Status: Active Ulcer/skin breakdown will have a volume reduction of 50% by week 8 Date Initiated: 09/28/2022 Date Inactivated: 11/01/2022 Target Resolution Date: 11/02/2022 Goal Status: Met Ulcer/skin breakdown will have a volume reduction of 80% by week 12 Date Initiated: 11/01/2022 Date Inactivated: 11/29/2022 Target Resolution Date: 11/29/2022 Unmet Reason: vac in place, still Goal Status: Unmet debriding heel Interventions: Assess patient/caregiver ability to obtain necessary supplies Assess patient/caregiver ability to perform ulcer/skin care regimen upon admission and as needed Assess ulceration(s) every visit Provide education on ulcer and skin care Treatment Activities: Skin care regimen initiated : 09/28/2022 Topical wound management initiated : 09/28/2022 Notes: Electronic Signature(s) Signed: 03/22/2023 12:03:42 PM By: Zenaida Deed RN, BSN Entered By: Zenaida Deed on 03/21/2023 11:50:33 -------------------------------------------------------------------------------- Negative Pressure Wound Therapy Maintenance (NPWT) Details Patient Name: Date of Service: [name], [name] 03/21/2023 11:15 A M Medical Record Number: [id number] Patient Account Number: [id number]  Date of Birth/Sex: Treating RN: December 05, 1950 (72 y.o. [name] Primary Care : Saralyn Pilar Other Clinician: Referring : Treating /Extender: Greer Pickerel in Treatment: 27 NPWT Maintenance Performed for: Wound #1 Right Gluteus Additional Injuries Covered: No Performed By: Zenaida Deed, RN Coverage Size (sq cm): 7.44 Pressure Type: Constant Pressure Setting: 125 mmHG Drain Type: None Primary Contact: Other : prisma Sponge/Dressing  Type: Foam- Black Date Initiated: 10/16/2022 Dressing Removed: Yes Quantity of Sponges/Gauze Removed: 1 Canister Changed: No JAMARRIA, JUBB ([id number]) [id number].pdf Page 5 of 9 Canister Exudate Volume: 150 Dressing Reapplied: Yes Quantity of Sponges/Gauze Inserted: 1 Respones T Treatment: o good Days On NPWT : 157 Post Procedure Diagnosis Same as Pre-procedure Electronic Signature(s) Signed: 03/22/2023 12:03:42 PM By: Zenaida Deed RN, BSN Entered By: Zenaida Deed on 03/21/2023 11:58:56 -------------------------------------------------------------------------------- Pain Assessment Details Patient Name: Date of Service: [name]. 03/21/2023 11:15 A M Medical Record Number: [id number] Patient Account Number: [id number] Date of Birth/Sex: Treating RN: 1951-02-27 (72 y.o. [name] Primary Care : Saralyn Pilar Other Clinician: Referring : Treating /Extender: Greer Pickerel in Treatment: 27 Active Problems Location of Pain Severity and Description of Pain Patient Has Paino Yes Site Locations Pain Location: Pain in Ulcers With Dressing Change: Yes Duration of the Pain. Constant / Intermittento Intermittent Rate the pain. Current Pain Level: 2 Worst Pain Level: 5 Least Pain Level: 0 Character of Pain Describe the Pain: Aching, Tender Pain Management and Medication Current Pain Management: Medication: Yes Is the Current Pain Management Adequate: Adequate How does your wound impact your activities of daily livingo Sleep: No Bathing: No Appetite: No Relationship With Others: No Bladder Continence: No Emotions: No Bowel Continence: No Hobbies: No Toileting: No Dressing: No Electronic Signature(s) Signed: 03/22/2023 12:03:42 PM By: Zenaida Deed RN, BSN Entered By: Zenaida Deed on 03/21/2023 11:29:13 [name] ([id number]) [id number].pdf Page 6 of  9 -------------------------------------------------------------------------------- Patient/Caregiver Education Details Patient Name: Date of Service: [name], [name] 8/7/2024andnbsp11:15 A M Medical Record Number: [id number] Patient Account Number: [id number] Date of Birth/Gender: Treating RN: 09-Aug-1951 (72 y.o. [name] Primary Care Physician: Saralyn Pilar Other Clinician: Referring Physician: Treating Physician/Extender: Greer Pickerel in Treatment: 60 Education Assessment Education Provided To: Patient Education Topics Provided Pressure: Methods: Explain/Verbal Responses: Reinforcements needed, State content correctly Venous: Methods: Explain/Verbal Responses: Reinforcements needed, State content correctly Wound/Skin Impairment: Methods: Explain/Verbal Responses: Reinforcements needed, State content correctly Electronic Signature(s) Signed: 03/22/2023 12:03:42 PM By: Zenaida Deed RN, BSN Entered By: Zenaida Deed on 03/21/2023 11:51:04 -------------------------------------------------------------------------------- Wound Assessment Details Patient Name: Date of Service: [name]. 03/21/2023 11:15 A M Medical Record Number: [id number] Patient Account Number: [id number] Date of Birth/Sex: Treating RN: [birth year] (72 y.o. 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 Wound Open Wounding Event: Pressure Injury Status: Date Acquired: 08/11/2022 Comorbid Chronic sinus problems/congestion, Sleep Apnea, Congestive Heart Weeks Of Treatment: 27 History: Failure, Raynauds, Rheumatoid Arthritis, Neuropathy Clustered Wound: No Photos [name], [name] ([id number]) [phone].pdf Page 7 of  9 Wound Measurements Length: (cm) 2.4 Width: (cm) 3.1 Depth: (cm) 6.3 Area: (cm) 5.843 Volume: (cm) 36.813 % Reduction in Area: 73.4% % Reduction in Volume: 72.1% Epithelialization: Small (1-33%) Tunneling: Yes Position (o'clock): 1 Maximum Distance: (cm) 9.3 Undermining: No Wound Description Classification: Category/Stage IV Wound Margin: Well defined, not  attached Exudate Amount: Large Exudate Type: Serosanguineous Exudate Color: red, brown Foul Odor After Cleansing: No Slough/Fibrino Yes Wound Bed Granulation Amount: Large (67-100%) Exposed Structure Granulation Quality: Red Fascia Exposed: No Necrotic Amount: None Present (0%) Fat Layer (Subcutaneous Tissue) Exposed: Yes Tendon Exposed: No Muscle Exposed: No Joint Exposed: No Bone Exposed: No Periwound Skin Texture Texture Color No Abnormalities Noted: No No Abnormalities Noted: Yes Callus: No Temperature / Pain Crepitus: No Temperature: No Abnormality Excoriation: No Tenderness on Palpation: Yes Induration: No Rash: No Scarring: No Moisture No Abnormalities Noted: Yes Electronic Signature(s) Signed: 03/22/2023 12:03:42 PM By: Zenaida Deed RN, BSN Entered By: Zenaida Deed on 03/21/2023 11:49:07 -------------------------------------------------------------------------------- Wound Assessment Details Patient Name: Date of Service: [name]. 03/21/2023 11:15 A M Medical Record Number: [id number] Patient Account Number: [id number] Date of Birth/Sex: Treating RN: [birth year] (72 y.o. [name] Primary Care : Saralyn Pilar Other Clinician: Referring : Treating /Extender: Greer Pickerel in Treatment: [address] MISHAWN, [name] ([id number]) [id number] Page 8 of 9 Wound Number: 3 Primary Pressure Ulcer Etiology: Wound Location: Right Calcaneus Wound Open Wounding Event: Pressure Injury Status: Date Acquired:  09/21/2022 Comorbid Chronic sinus problems/congestion, Sleep Apnea, Congestive Heart Weeks Of Treatment: 24 History: Failure, Raynauds, Rheumatoid Arthritis, Neuropathy Clustered Wound: No Photos Wound Measurements Length: (cm) 1.1 Width: (cm) 3.2 Depth: (cm) 0.6 Area: (cm) 2.765 Volume: (cm) 1.659 % Reduction in Area: 71.4% % Reduction in Volume: -71.7% Epithelialization: Small (1-33%) Tunneling: No Undermining: No Wound Description Classification: Category/Stage IV Wound Margin: Distinct, outline attached Exudate Amount: Medium Exudate Type: Serosanguineous Exudate Color: red, brown Foul Odor After Cleansing: No Slough/Fibrino Yes Wound Bed Granulation Amount: Large (67-100%) Exposed Structure Granulation Quality: Red, Pale Fascia Exposed: No Necrotic Amount: Small (1-33%) Fat Layer (Subcutaneous Tissue) Exposed: Yes Necrotic Quality: Adherent Slough Tendon Exposed: No Muscle Exposed: No Joint Exposed: No Bone Exposed: No Periwound Skin Texture Texture Color No Abnormalities Noted: Yes No Abnormalities Noted: Yes Moisture Temperature / Pain No Abnormalities Noted: Yes Temperature: No Abnormality Tenderness on Palpation: Yes Electronic Signature(s) Signed: 03/22/2023 12:03:42 PM By: Zenaida Deed RN, BSN Entered By: Zenaida Deed on 03/21/2023 11:50:02 -------------------------------------------------------------------------------- Vitals Details Patient Name: Date of Service: [name]. 03/21/2023 11:15 A M Medical Record Number: [id number] Patient Account Number: [id number] Date of Birth/Sex: Treating RN: 02/08/1951 (72 y.o. [name] Primary Care : Saralyn Pilar Other Clinician: Referring : Treating /Extender: Greer Pickerel in Treatment: [address] [name], [name] ([id number]) [id number] Page 9 of 9 Time Taken: 11:25 Temperature (F): 98.2 Height (in):  62 Pulse (bpm): 31 Weight (lbs): 207 Respiratory Rate (breaths/min): 20 Body Mass Index (BMI): 37.9 Blood Pressure (mmHg): 132/63 Reference Range: 80 - 120 mg / dl Electronic Signature(s) Signed: 03/22/2023 12:03:42 PM By: Zenaida Deed RN, BSN Entered By: Zenaida Deed on 03/21/2023 53:66:44

## 2023-03-22 NOTE — Progress Notes (Signed)
[name], [name] ([id number]) [id number] Page 1 of 12 Visit Report for 03/21/2023 Chief Complaint Document Details Patient Name: Date of Service: [name] 03/21/2023 11:15 A M Medical Record Number: [id number] Patient Account Number: [id number] Date of Birth/Sex: Treating RN: [birth year] (72 y.o. F) Primary Care Provider: Saralyn Pilar Other Clinician: Referring Provider: Treating Provider/Extender: Greer Pickerel in Treatment: 27 Information Obtained from: Patient Chief Complaint Patient is at the clinic for treatment of open pressure ulcers Electronic Signature(s) Signed: 03/21/2023 12:05:15 PM By: Duanne Guess MD FACS Entered By: Duanne Guess on 03/21/2023 12:05:15 -------------------------------------------------------------------------------- Debridement Details Patient Name: Date of Service: [name] 03/21/2023 11:15 A M Medical Record Number: [id number] Patient Account Number: [id number] Date of Birth/Sex: Treating RN: 1951-07-08 (72 y.o. [name] Primary Care Provider: Saralyn Pilar Other Clinician: Referring Provider: Treating Provider/Extender: Greer Pickerel in Treatment: 27 Debridement Performed for Assessment: Wound #3 Right Calcaneus Performed By: Physician Duanne Guess, MD Debridement Type: Debridement Level of Consciousness (Pre-procedure): Awake and Alert Pre-procedure Verification/Time Out Yes - 11:55 Taken: Start Time: 11:57 Pain Control: Lidocaine 4% T opical Solution Percent of Wound Bed Debrided: 100% T Area Debrided (cm): otal 2.76 Tissue and other material debrided: Non-Viable, Slough, Slough Level: Non-Viable Tissue Debridement Description: Selective/Open Wound Instrument: Curette Bleeding: Minimum Hemostasis Achieved: Pressure Procedural Pain: 0 Post Procedural Pain: 0 Response to Treatment: Procedure was tolerated well Level of  Consciousness (Post- Awake and Alert procedure): Post Debridement Measurements of Total Wound Length: (cm) 1.1 Stage: Category/Stage IV Width: (cm) 3.2 Depth: (cm) 0.6 Volume: (cm) 1.659 Character of Wound/Ulcer Post Debridement: Improved Post Procedure Diagnosis [name], [name] ([id number]) [id number] Page 2 of 12 Same as Pre-procedure Notes scribed for Dr. Lady Gary by Zenaida Deed, RN Electronic Signature(s) Signed: 03/21/2023 12:22:30 PM By: Duanne Guess MD FACS Signed: 03/22/2023 12:03:42 PM By: Zenaida Deed RN, BSN Entered By: Zenaida Deed on 03/21/2023 12:03:51 -------------------------------------------------------------------------------- HPI Details Patient Name: Date of Service: [name]. 03/21/2023 11:15 A M Medical Record Number: [id number] Patient Account Number: [id number] Date of Birth/Sex: Treating RN: 04/10/1951 (72 y.o. F) Primary Care Provider: Saralyn Pilar Other Clinician: Referring Provider: Treating Provider/Extender: Greer Pickerel in Treatment: 28 History of Present Illness HPI Description: ADMISSION 09/07/2023 This is a 72 year old woman with a past medical history notable for obesity, congestive heart failure, Raynaud's syndrome, CKD stage IIIb, osteoporosis, and rheumatoid arthritis. In November 2023, she suffered a fall that resulted in a femur fracture. She was hospitalized for about a week and underwent surgical repair of the fracture. She subsequently developed pressure ulcers on her right buttocks and ischium. She has been receiving home health services and they have been applying Medihoney. They have been reporting the wounds as stage II, but on evaluation, the large ulcer was probably unstageable at the time they were evaluating it but it is clearly a stage IV at this point. The smaller ulcer does have fat layer exposure and therefore is a stage III. The patient is accompanied by her  daughter. She says she has been sleeping on a regular bed but recently ordered an air mattress T opper, but does not have it yet. She is on Ozempic for weight loss and therefore has a poor appetite and struggles to get adequate protein intake. 09/15/2022: The large stage IV ulcer is substantially cleaner this week. The stage III ulcer is a little smaller. Both have slough accumulation. The culture that I took last week was polymicrobial.  The Augmentin that I prescribed was adequate coverage for the species and she continues to take this. We have also ordered Keystone topical antibiotic compound, but this has not yet arrived. 09/21/2022: The stage IV ulcer has some necrotic muscle at the base but is otherwise fairly clean. The stage III ulcer is smaller again with light slough on the surface. She has her Keystone topical antibiotic compound with her today. 09/29/2022: There is still some necrotic muscle at the base of the stage IV ulcer that I was unable to get to last week. The stage III ulcer has healed. She unfortunately has developed a new pressure ulcer on her right heel. 10/06/2022: Still with some devitalized muscle at the base of the stage IV ulcer, but otherwise this wound is looking quite clean. The pressure induced tissue injury on her heel is demarcating and much of the area appears to be epithelialized, but there is still 2 areas that remain questionable. 10/12/2022: The stage IV ulcer is very clean and ready for wound VAC, which will be delivered tomorrow according to the patient. The tissue injury on her heel is drying up and does not feel particularly boggy this week. 10/19/2022: The heel injury continues to improve. There is still some dry eschar present on the more plantar aspect of it. There is some slough accumulation on the surface of the stage IV pressure ulcer. The wound VAC was initiated by home health last week. 11/01/2022: The gluteal pressure ulcer is very clean without any necrotic  tissue or slough. The depth has come in by over half a centimeter. The deep tissue injury on her heel continues to improve. There is still dry eschar that we are painting with Betadine as well as some fresh-looking viable tissue at the margin. 11/15/2022: The gluteal pressure ulcer is clean and contracting. The eschar on her heel ulcer is beginning to separate from the underlying tissue. 11/29/2022: The gluteal pressure ulcer is very clean. The depth has come in by about a centimeter. The eschar has completely separated off of her heel. There is some fibrinous exudate and slough on the heel. It has declared itself as a stage III at this point. 12/13/2022: The heel ulcer is smaller and much cleaner, but still has some non-viable tissue present. The orifice of the gluteal ulcer is contracting, but the depth remains the same. Fortunately, the sponge for the [facility] is being packed appropriately into the full depth of the wound. 12/27/2022: The heel ulcer is smaller, but I think the depth and degree of tissue injury has finally declared itself, with tendon being exposed at the posterior aspect of the ulcer. It is now stage IV. The gluteal ulcer continues to contract circumferentially, but the depth is still unchanged. 01/10/2023: The heel ulcer has contracted further. There is still rubbery slough on the surface. There has been no further tissue breakdown; I think the last of the nonviable tissue was removed at her visit 2 weeks ago. The gluteal ulcer has contracted further circumferentially, but there is still a tunnel that ankles off cranially that remains about the same depth. The tissues appear viable at both sites. 01/24/2023: Unfortunately, the home health nurse that was applying the wound VAC did not put any drape on her skin for the bridge and was applying sponge directly to the patient's skin. This is resulted in significant tissue breakdown and pain for the patient. Once we were notified of this, we  discontinued the wound VAC and they have been packing the wound  with saline moistened gauze. The heel has also deteriorated. Bone is now exposed. 01/30/2023: Her skin looks significantly better this week. It has recovered from the insult caused by applying the sponge directly to it. Her heel looks a little bit better as well. The culture that I took last week grew out group A strep, Proteus mirabilis, and Enterococcus, along with other skin flora. Although Levaquin was the recommended antibiotic, she is on amiodarone and therefore Levaquin is contraindicated. She has been taking Augmentin and is showing signs of FARZANA, TANNENBAUM ([id number]) [phone].pdf Page 3 of 12 improvement. 02/07/2023: The insult to her skin caused by direct application of wound VAC sponge has nearly completely healed. The gluteal ulcer cavity continues to contract circumferentially, but still probes quite deeply; I suspect the sponge for her VAC is not getting placed completely down into that tunnel. The heel is looking better today. There is more granulation filling in. I did not feel bone today. 02/14/2023: The gluteal ulcer cavity has circumferentially contracted even further, although the depth remains the same. The heel has improved and there is good tissue overlying where the bone had been exposed. Both wounds have some slough on the surface. 02/21/2023: The gluteal ulcer cavity continues to contract and is about half a centimeter shallower this week. The surface is clean without any slough or other debris accumulation. The heel continues to fill in granulation tissue over where the bone had been exposed. There is some slough on the wound surface. 03/07/2023: The gluteal ulcer was measured slightly deeper today, but on palpation, it feels roughly the same. The cavity is certainly more contracted. She was complaining of more pain in the area and it appears that GranuFoam was applied directly to her skin  and she has some irritation from this. The heel ulcer is filling in with excellent granulation tissue. It does not probe to bone any longer. Minimal slough accumulation. 03/21/2023: The gluteal ulcer is shallower and the cavity is tighter. She has some periwound skin breakdown that we have been trying to address by applying DuoDERM to the skin around the wound opening. The home health nurse also added nystatin powder to address some yeasty-looking areas. The heel ulcer is much shallower and smaller. Minimal slough and biofilm buildup. Electronic Signature(s) Signed: 03/21/2023 12:06:31 PM By: Duanne Guess MD FACS Entered By: Duanne Guess on 03/21/2023 12:06:31 -------------------------------------------------------------------------------- Physical Exam Details Patient Name: Date of Service: [name], [name] 03/21/2023 11:15 A M Medical Record Number: [id number] Patient Account Number: [id number] Date of Birth/Sex: Treating RN: 05/08/1951 (72 y.o. F) Primary Care Provider: Saralyn Pilar Other Clinician: Referring Provider: Treating Provider/Extender: Greer Pickerel in Treatment: 27 Constitutional . Bradycardic, asymptomatic. . . no acute distress. Respiratory Normal work of breathing on room air. Notes 03/21/2023: The gluteal ulcer is shallower and the cavity is tighter. She has some periwound skin breakdown. The heel ulcer is much shallower and smaller. Minimal slough and biofilm buildup. Electronic Signature(s) Signed: 03/21/2023 12:11:05 PM By: Duanne Guess MD FACS Entered By: Duanne Guess on 03/21/2023 12:11:05 -------------------------------------------------------------------------------- Physician Orders Details Patient Name: Date of Service: [name] 03/21/2023 11:15 A M Medical Record Number: [id number] Patient Account Number: [id number] Date of Birth/Sex: Treating RN: 06/06/1951 (72 y.o. [name] Primary Care Provider:  Saralyn Pilar Other Clinician: Referring Provider: Treating Provider/Extender: Greer Pickerel in Treatment: 69 Verbal / Phone Orders: No Diagnosis Coding ICD-10 Coding Code Description TRANESHA, DRUMHELLER ([id number]) [phone].pdf Page 4 of  12 L89.314 Pressure ulcer of right buttock, stage 4 L89.614 Pressure ulcer of right heel, stage 4 I73.00 Raynaud's syndrome without gangrene I50.32 Chronic diastolic (congestive) heart failure N18.32 Chronic kidney disease, stage 3b R73.02 Impaired glucose tolerance (oral) E66.9 Obesity, unspecified Follow-up Appointments ppointment in 2 weeks. - Dr Lady Gary Rm 1 Return A Wed 8/21 @ 10:30 am Anesthetic Wound #1 Right Gluteus (In clinic) Topical Lidocaine 4% applied to wound bed Bathing/ Shower/ Hygiene May shower and wash wound with soap and water. Negative Presssure Wound Therapy Wound #1 Right Gluteus Wound Vac to wound continuously at 165mm/hg pressure Black Foam Off-Loading Heel suspension boot to: - globoped right foot to ambulate Low air-loss mattress (Group 2) - Adapt Turn and reposition every 2 hours - avoid lying on back, stand at least every hour while out of bed, float heels off bed with pillows under calves while in bed Prevalon Boot - right foot esp. while in bed. Additional Orders / Instructions Follow Nutritious Diet - increase protein to 70-80 gms per day, hold ozempic Juven Shake 1-2 times daily. Home Health No change in wound care orders this week; continue Home Health for wound care. May utilize formulary equivalent dressing for wound treatment orders unless otherwise specified. Other Home Health Orders/Instructions: - [facility] Wound Treatment Wound #1 - Gluteus Wound Laterality: Right Cleanser: Wound Cleanser 3 x Per Week/30 Days Discharge Instructions: Cleanse the wound with wound cleanser prior to applying a clean dressing using gauze sponges, not tissue or cotton  balls. Peri-Wound Care: Nystop Powder (Nystatin) 3 x Per Week/30 Days Discharge Instructions: Apply Nystop (Nystatin) Powder as needed to irritated skin Prim Dressing: Promogran Prisma Matrix, 4.34 (sq in) (silver collagen) 3 x Per Week/30 Days ary Discharge Instructions: Moisten collagen with saline or hydrogel Prim Dressing: Maxorb Extra Calcium Alginate, 2x2 (in/in) 3 x Per Week/30 Days ary Discharge Instructions: Apply to weeping periwound under drape Prim Dressing: 29M T ary egaderm Hydrocolloid Dressing, 4x4 (in/in) 3 x Per Week/30 Days Discharge Instructions: to periwound to protect Prim Dressing: VAC ary 3 x Per Week/30 Days Secondary Dressing: Duoderm 3 x Per Week/30 Days Discharge Instructions: apply to periwound, under vac drape Wound #3 - Calcaneus Wound Laterality: Right Peri-Wound Care: Zinc Oxide Ointment 30g tube 1 x Per Day/30 Days Discharge Instructions: Apply Zinc Oxide to periwound with each dressing change Topical: Gentamicin 1 x Per Day/30 Days Discharge Instructions: mix 50:50 with mupirocin and rub into wound bed Topical: Mupirocin Ointment 1 x Per Day/30 Days Discharge Instructions: Apply Mupirocin (Bactroban) as instructed Prim Dressing: Maxorb Extra Ag+ Alginate Dressing, 4x4.75 (in/in) 1 x Per Day/30 Days ary Discharge Instructions: Apply to wound bed as instructed [name], [name] ([id number]) [id number] Page 5 of 12 Secondary Dressing: ALLEVYN Heel 4 1/2in x 5 1/2in / 10.5cm x 13.5cm 1 x Per Day/30 Days Discharge Instructions: Apply over primary dressing as directed. Secondary Dressing: Woven Gauze Sponge, Non-Sterile 4x4 in 1 x Per Day/30 Days Discharge Instructions: Apply over primary dressing as directed. Secured With: [employer], 4.5x3.1 (in/yd) 1 x Per Day/30 Days Discharge Instructions: Secure with Kerlix as directed. Compression Stockings: Circaid Juxta Lite Compression Wrap Right Leg Compression Amount: 30-40  mmHG Discharge Instructions: Apply Circaid Juxta Lite Compression Wrap daily as instructed. Apply first thing in the morning, remove at night before bed. Electronic Signature(s) Signed: 03/21/2023 12:22:30 PM By: Duanne Guess MD FACS Entered By: Duanne Guess on 03/21/2023 12:12:28 -------------------------------------------------------------------------------- Problem List Details Patient Name: Date of Service: [name]. 03/21/2023  11:15 A M Medical Record Number: [id number] Patient Account Number: [id number] Date of Birth/Sex: Treating RN: Apr 18, 1951 (72 y.o. [name] Primary Care Provider: Saralyn Pilar Other Clinician: Referring Provider: Treating Provider/Extender: Greer Pickerel in Treatment: 27 Active Problems ICD-10 Encounter Code Description Active Date MDM Diagnosis L89.314 Pressure ulcer of right buttock, stage 4 09/07/2022 No Yes L89.614 Pressure ulcer of right heel, stage 4 09/28/2022 No Yes I73.00 Raynaud's syndrome without gangrene 09/07/2022 No Yes I50.32 Chronic diastolic (congestive) heart failure 09/07/2022 No Yes N18.32 Chronic kidney disease, stage 3b 09/07/2022 No Yes R73.02 Impaired glucose tolerance (oral) 09/07/2022 No Yes E66.9 Obesity, unspecified 09/07/2022 No Yes Inactive Problems ICD-10 Code Description Active Date Inactive Date TALYIA, RAIKES ([id number]) [id number] Page 6 of 12 L89.313 Pressure ulcer of right buttock, stage 3 09/07/2022 09/07/2022 Resolved Problems Electronic Signature(s) Signed: 03/21/2023 12:04:51 PM By: Duanne Guess MD FACS Entered By: Duanne Guess on 03/21/2023 12:04:50 -------------------------------------------------------------------------------- Progress Note Details Patient Name: Date of Service: [name] 03/21/2023 11:15 A M Medical Record Number: [id number] Patient Account Number: [id number] Date of Birth/Sex: Treating RN: 04-27-1951 (72  y.o. F) Primary Care Provider: Saralyn Pilar Other Clinician: Referring Provider: Treating Provider/Extender: Greer Pickerel in Treatment: 27 Subjective Chief Complaint Information obtained from Patient Patient is at the clinic for treatment of open pressure ulcers History of Present Illness (HPI) ADMISSION 09/07/2023 This is a 72 year old woman with a past medical history notable for obesity, congestive heart failure, Raynaud's syndrome, CKD stage IIIb, osteoporosis, and rheumatoid arthritis. In November 2023, she suffered a fall that resulted in a femur fracture. She was hospitalized for about a week and underwent surgical repair of the fracture. She subsequently developed pressure ulcers on her right buttocks and ischium. She has been receiving home health services and they have been applying Medihoney. They have been reporting the wounds as stage II, but on evaluation, the large ulcer was probably unstageable at the time they were evaluating it but it is clearly a stage IV at this point. The smaller ulcer does have fat layer exposure and therefore is a stage III. The patient is accompanied by her daughter. She says she has been sleeping on a regular bed but recently ordered an air mattress T opper, but does not have it yet. She is on Ozempic for weight loss and therefore has a poor appetite and struggles to get adequate protein intake. 09/15/2022: The large stage IV ulcer is substantially cleaner this week. The stage III ulcer is a little smaller. Both have slough accumulation. The culture that I took last week was polymicrobial. The Augmentin that I prescribed was adequate coverage for the species and she continues to take this. We have also ordered Keystone topical antibiotic compound, but this has not yet arrived. 09/21/2022: The stage IV ulcer has some necrotic muscle at the base but is otherwise fairly clean. The stage III ulcer is smaller again with light slough  on the surface. She has her Keystone topical antibiotic compound with her today. 09/29/2022: There is still some necrotic muscle at the base of the stage IV ulcer that I was unable to get to last week. The stage III ulcer has healed. She unfortunately has developed a new pressure ulcer on her right heel. 10/06/2022: Still with some devitalized muscle at the base of the stage IV ulcer, but otherwise this wound is looking quite clean. The pressure induced tissue injury on her heel is demarcating and much of the area appears  to be epithelialized, but there is still 2 areas that remain questionable. 10/12/2022: The stage IV ulcer is very clean and ready for wound VAC, which will be delivered tomorrow according to the patient. The tissue injury on her heel is drying up and does not feel particularly boggy this week. 10/19/2022: The heel injury continues to improve. There is still some dry eschar present on the more plantar aspect of it. There is some slough accumulation on the surface of the stage IV pressure ulcer. The wound VAC was initiated by home health last week. 11/01/2022: The gluteal pressure ulcer is very clean without any necrotic tissue or slough. The depth has come in by over half a centimeter. The deep tissue injury on her heel continues to improve. There is still dry eschar that we are painting with Betadine as well as some fresh-looking viable tissue at the margin. 11/15/2022: The gluteal pressure ulcer is clean and contracting. The eschar on her heel ulcer is beginning to separate from the underlying tissue. 11/29/2022: The gluteal pressure ulcer is very clean. The depth has come in by about a centimeter. The eschar has completely separated off of her heel. There is some fibrinous exudate and slough on the heel. It has declared itself as a stage III at this point. 12/13/2022: The heel ulcer is smaller and much cleaner, but still has some non-viable tissue present. The orifice of the gluteal ulcer is  contracting, but the depth remains the same. Fortunately, the sponge for the [facility] [place] [facility] is being packed appropriately into the full depth of the wound. 12/27/2022: The heel ulcer is smaller, but I think the depth and degree of tissue injury has finally declared itself, with tendon being exposed at the posterior aspect of the ulcer. It is now stage IV. The gluteal ulcer continues to contract circumferentially, but the depth is still unchanged. 01/10/2023: The heel ulcer has contracted further. There is still rubbery slough on the surface. There has been no further tissue breakdown; I think the last of the nonviable tissue was removed at her visit 2 weeks ago. The gluteal ulcer has contracted further circumferentially, but there is still a tunnel that ankles off cranially that remains about the same depth. The tissues appear viable at both sites. 01/24/2023: Unfortunately, the home health nurse that was applying the wound VAC did not put any drape on her skin for the bridge and was applying sponge directly to the patient's skin. This is resulted in significant tissue breakdown and pain for the patient. Once we were notified of this, we discontinued the wound [name], [name] ([id number]) [phone].pdf Page 7 of 12 VAC and they have been packing the wound with saline moistened gauze. The heel has also deteriorated. Bone is now exposed. 01/30/2023: Her skin looks significantly better this week. It has recovered from the insult caused by applying the sponge directly to it. Her heel looks a little bit better as well. The culture that I took last week grew out group A strep, Proteus mirabilis, and Enterococcus, along with other skin flora. Although Levaquin was the recommended antibiotic, she is on amiodarone and therefore Levaquin is contraindicated. She has been taking Augmentin and is showing signs of improvement. 02/07/2023: The insult to her skin caused by direct application of wound VAC  sponge has nearly completely healed. The gluteal ulcer cavity continues to contract circumferentially, but still probes quite deeply; I suspect the sponge for her VAC is not getting placed completely down into that tunnel. The heel is looking  better today. There is more granulation filling in. I did not feel bone today. 02/14/2023: The gluteal ulcer cavity has circumferentially contracted even further, although the depth remains the same. The heel has improved and there is good tissue overlying where the bone had been exposed. Both wounds have some slough on the surface. 02/21/2023: The gluteal ulcer cavity continues to contract and is about half a centimeter shallower this week. The surface is clean without any slough or other debris accumulation. The heel continues to fill in granulation tissue over where the bone had been exposed. There is some slough on the wound surface. 03/07/2023: The gluteal ulcer was measured slightly deeper today, but on palpation, it feels roughly the same. The cavity is certainly more contracted. She was complaining of more pain in the area and it appears that GranuFoam was applied directly to her skin and she has some irritation from this. The heel ulcer is filling in with excellent granulation tissue. It does not probe to bone any longer. Minimal slough accumulation. 03/21/2023: The gluteal ulcer is shallower and the cavity is tighter. She has some periwound skin breakdown that we have been trying to address by applying DuoDERM to the skin around the wound opening. The home health nurse also added nystatin powder to address some yeasty-looking areas. The heel ulcer is much shallower and smaller. Minimal slough and biofilm buildup. Patient History Information obtained from Patient, Caregiver, Chart. Family History Cancer - Father,Paternal Grandparents, Heart Disease - Mother,Father,Paternal Grandparents, Hypertension - Mother, Stroke - Maternal Grandparents, No family history  of Diabetes, Hereditary Spherocytosis, Kidney Disease, Lung Disease, Seizures, Thyroid Problems, Tuberculosis. 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 Medical History Eyes Denies history of Cataracts, Glaucoma, Optic Neuritis Ear/Nose/Mouth/Throat Patient has history of Chronic sinus problems/congestion - chronic rhinitis Denies history of Middle ear problems Respiratory Patient has history of Sleep Apnea - uses CPAP Cardiovascular Patient has history of Congestive Heart Failure Endocrine Denies history of Type I Diabetes, Type II Diabetes Genitourinary Denies history of End Stage Renal Disease Immunological Patient has history of Raynauds Denies history of Lupus Erythematosus, Scleroderma Integumentary (Skin) Denies history of History of Burn Musculoskeletal Patient has history of Rheumatoid Arthritis Denies history of Gout, Osteoarthritis, Osteomyelitis Neurologic Patient has history of Neuropathy Denies history of Dementia, Quadriplegia, Paraplegia, Seizure Disorder Oncologic Denies history of Received Chemotherapy, Received Radiation Psychiatric Denies history of Anorexia/bulimia, Confinement Anxiety Hospitalization/Surgery History - right femur fx ORIR. - right knee replacement. - left breast mass excision. - left ankle tibia repair. - abdominal hysterectomy. - adnoidectomy/tonsillectomy. - cholecystectomy. - dental implants. - patoid cystectomy. - tubal ligation. Medical A Surgical History Notes nd Constitutional Symptoms (General Health) morbid obesity Cardiovascular hyperlipidemia Gastrointestinal GERD, IBS, eosinophilic esophagitis, diverticulosis, Genitourinary CKD stage 3 Immunological Sjoegren syndrome, fibromyalgia Objective [name], [name] ([id number]) [id number] Page 8 of 12 Constitutional Bradycardic, asymptomatic. no acute  distress. Vitals Time Taken: 11:25 AM, Height: 62 in, Weight: 207 lbs, BMI: 37.9, Temperature: 98.2 F, Pulse: 31 bpm, Respiratory Rate: 20 breaths/min, Blood Pressure: 132/63 mmHg. Respiratory Normal work of breathing on room air. General Notes: 03/21/2023: The gluteal ulcer is shallower and the cavity is tighter. She has some periwound skin breakdown. The heel ulcer is much shallower and smaller. Minimal slough and biofilm buildup. Integumentary (Hair, Skin) Wound #1 status is Open. Original cause of wound was Pressure Injury. The date acquired was: 08/11/2022. The wound  has been in treatment 27 weeks. The wound is located on the Right Gluteus. The wound measures 2.4cm length x 3.1cm width x 6.3cm depth; 5.843cm^2 area and 36.813cm^3 volume. There is Fat Layer (Subcutaneous Tissue) exposed. There is no undermining noted, however, there is tunneling at 1:00 with a maximum distance of 9.3cm. There is a large amount of serosanguineous drainage noted. The wound margin is well defined and not attached to the wound base. There is large (67-100%) red granulation within the wound bed. There is no necrotic tissue within the wound bed. The periwound skin appearance had no abnormalities noted for moisture. The periwound skin appearance had no abnormalities noted for color. The periwound skin appearance did not exhibit: Callus, Crepitus, Excoriation, Induration, Rash, Scarring. Periwound temperature was noted as No Abnormality. The periwound has tenderness on palpation. Wound #3 status is Open. Original cause of wound was Pressure Injury. The date acquired was: 09/21/2022. The wound has been in treatment 24 weeks. The wound is located on the Right Calcaneus. The wound measures 1.1cm length x 3.2cm width x 0.6cm depth; 2.765cm^2 area and 1.659cm^3 volume. There is Fat Layer (Subcutaneous Tissue) exposed. There is no tunneling or undermining noted. There is a medium amount of serosanguineous drainage noted.  The wound margin is distinct with the outline attached to the wound base. There is large (67-100%) red, pale granulation within the wound bed. There is a small (1- 33%) amount of necrotic tissue within the wound bed including Adherent Slough. The periwound skin appearance had no abnormalities noted for texture. The periwound skin appearance had no abnormalities noted for moisture. The periwound skin appearance had no abnormalities noted for color. Periwound temperature was noted as No Abnormality. The periwound has tenderness on palpation. 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 There was a Selective/Open Wound Non-Viable Tissue Debridement with a total area of 2.76 sq cm performed by Duanne Guess, MD. With the following instrument(s): Curette to remove Non-Viable tissue/material. Material removed includes [facility] after achieving pain control using Lidocaine 4% Topical Solution. No specimens were taken. A time out was conducted at 11:55, prior to the start of the procedure. A Minimum amount of bleeding was controlled with Pressure. The procedure was tolerated well with a pain level of 0 throughout and a pain level of 0 following the procedure. Post Debridement Measurements: 1.1cm length x 3.2cm width x 0.6cm depth; 1.659cm^3 volume. Post debridement Stage noted as Category/Stage IV. Character of Wound/Ulcer Post Debridement is improved. Post procedure Diagnosis Wound #3: Same as Pre-Procedure General Notes: scribed for Dr. Lady Gary by Zenaida Deed, RN. Plan Follow-up Appointments: Return Appointment in 2 weeks. - Dr Lady Gary Rm 1 Wed 8/21 @  10:30 am Anesthetic: Wound #1 Right Gluteus: (In clinic) Topical Lidocaine 4% applied to wound bed Bathing/ Shower/ Hygiene: May shower and wash wound with soap and water. Negative Presssure Wound Therapy: Wound #1 Right Gluteus: Wound Vac to wound continuously at 142mm/hg pressure Black Foam Off-Loading: Heel suspension boot to: - globoped right foot to ambulate Low air-loss mattress (Group 2) - Adapt Turn and reposition every 2 hours - avoid lying on back, stand at least every hour while out of bed, float heels off bed with pillows under calves while  in bed Prevalon Boot - right foot esp. while in bed. [name], [name] ([id number]) [id number] Page 9 of 12 Additional Orders / Instructions: Follow Nutritious Diet - increase protein to 70-80 gms per day, hold ozempic Juven Shake 1-2 times daily. Home Health: No change in wound care orders this week; continue Home Health for wound care. May utilize formulary equivalent dressing for wound treatment orders unless otherwise specified. Other Home Health Orders/Instructions: - [facility] WOUND #1: - Gluteus Wound Laterality: Right Cleanser: Wound Cleanser 3 x Per Week/30 Days Discharge Instructions: Cleanse the wound with wound cleanser prior to applying a clean dressing using gauze sponges, not tissue or cotton balls. Peri-Wound Care: Nystop Powder (Nystatin) 3 x Per Week/30 Days Discharge Instructions: Apply Nystop (Nystatin) Powder as needed to irritated skin Prim Dressing: Promogran Prisma Matrix, 4.34 (sq in) (silver collagen) 3 x Per Week/30 Days ary Discharge Instructions: Moisten collagen with saline or hydrogel Prim Dressing: Maxorb Extra Calcium Alginate, 2x2 (in/in) 3 x Per Week/30 Days ary Discharge Instructions: Apply to weeping periwound under drape Prim Dressing: 33M T ary egaderm Hydrocolloid Dressing, 4x4 (in/in) 3 x Per Week/30 Days Discharge Instructions: to periwound to protect Prim Dressing:  VAC 3 x Per Week/30 Days ary Secondary Dressing: Duoderm 3 x Per Week/30 Days Discharge Instructions: apply to periwound, under vac drape WOUND #3: - Calcaneus Wound Laterality: Right Peri-Wound Care: Zinc Oxide Ointment 30g tube 1 x Per Day/30 Days Discharge Instructions: Apply Zinc Oxide to periwound with each dressing change Topical: Gentamicin 1 x Per Day/30 Days Discharge Instructions: mix 50:50 with mupirocin and rub into wound bed Topical: Mupirocin Ointment 1 x Per Day/30 Days Discharge Instructions: Apply Mupirocin (Bactroban) as instructed Prim Dressing: Maxorb Extra Ag+ Alginate Dressing, 4x4.75 (in/in) 1 x Per Day/30 Days ary Discharge Instructions: Apply to wound bed as instructed Secondary Dressing: ALLEVYN Heel 4 1/2in x 5 1/2in / 10.5cm x 13.5cm 1 x Per Day/30 Days Discharge Instructions: Apply over primary dressing as directed. Secondary Dressing: Woven Gauze Sponge, Non-Sterile 4x4 in 1 x Per Day/30 Days Discharge Instructions: Apply over primary dressing as directed. Secured With: [employer], 4.5x3.1 (in/yd) 1 x Per Day/30 Days Discharge Instructions: Secure with Kerlix as directed. Com pression Stockings: Circaid Juxta Lite Compression Wrap Compression Amount: 30-40 mmHg (right) Discharge Instructions: Apply Circaid Juxta Lite Compression Wrap daily as instructed. Apply first thing in the morning, remove at night before bed. 03/21/2023: The gluteal ulcer is shallower and the cavity is tighter. She has some periwound skin breakdown that we have been trying to address by applying DuoDERM to the skin around the wound opening. The home health nurse also added nystatin powder to address some yeasty-looking areas. The heel ulcer is much shallower and smaller. Minimal slough and biofilm buildup. The gluteal ulcer did not require debridement. Will continue negative pressure wound therapy and continue our efforts to protect her periwound skin. I debrided some skin slough  and biofilm from the heel ulcer. Continue makeshift topical gentamicin and mupirocin with silver alginate and a heel cup. Follow-up in 2 weeks. Electronic Signature(s) Signed: 03/21/2023 12:13:17 PM By: Duanne Guess MD FACS Entered By: Duanne Guess on 03/21/2023 12:13:17 -------------------------------------------------------------------------------- HxROS Details Patient Name: Date of Service: [name]. 03/21/2023 11:15 A M Medical Record Number: [id number] Patient Account Number: [id number] Date of Birth/Sex: Treating RN: 10-Sep-1950 (72 y.o. F) Primary Care Provider: Saralyn Pilar Other Clinician: Referring Provider: Treating Provider/Extender: Greer Pickerel in Treatment: 27 Information Obtained From Patient Caregiver  Chart Constitutional Symptoms (General Health) Medical History: Past Medical History Notes: morbid obesity [name], [name] ([id number]) [id number] Page 10 of 12 Eyes Medical History: Negative for: Cataracts; Glaucoma; Optic Neuritis Ear/Nose/Mouth/Throat Medical History: Positive for: Chronic sinus problems/congestion - chronic rhinitis Negative for: Middle ear problems Respiratory Medical History: Positive for: Sleep Apnea - uses CPAP Cardiovascular Medical History: Positive for: Congestive Heart Failure Past Medical History Notes: hyperlipidemia Gastrointestinal Medical History: Past Medical History Notes: GERD, IBS, eosinophilic esophagitis, diverticulosis, Endocrine Medical History: Negative for: Type I Diabetes; Type II Diabetes Genitourinary Medical History: Negative for: End Stage Renal Disease Past Medical History Notes: CKD stage 3 Immunological Medical History: Positive for: Raynauds Negative for: Lupus Erythematosus; Scleroderma Past Medical History Notes: Sjoegren syndrome, fibromyalgia Integumentary (Skin) Medical History: Negative for: History of  Burn Musculoskeletal Medical History: Positive for: Rheumatoid Arthritis Negative for: Gout; Osteoarthritis; Osteomyelitis Neurologic Medical History: Positive for: Neuropathy Negative for: Dementia; Quadriplegia; Paraplegia; Seizure Disorder Oncologic Medical History: Negative for: Received Chemotherapy; Received Radiation Psychiatric Medical History: Negative for: Anorexia/bulimia; Confinement Anxiety HBO Extended History Items Ear/Nose/Mouth/Throat: Chronic sinus problems/congestion SUVI, HUSMAN ([id number]) [id number] Page 11 of 12 Immunizations Pneumococcal Vaccine: Received Pneumococcal Vaccination: Yes Received Pneumococcal Vaccination On or After 60th Birthday: Yes Implantable Devices None Hospitalization / Surgery History Type of Hospitalization/Surgery right femur fx ORIR right knee replacement left breast mass excision left ankle tibia repair abdominal hysterectomy adnoidectomy/tonsillectomy cholecystectomy dental implants patoid cystectomy tubal ligation Family and Social History Cancer: Yes - Father,Paternal Grandparents; Diabetes: No; Heart Disease: Yes - Mother,Father,Paternal Grandparents; Hereditary Spherocytosis: No; Hypertension: Yes - Mother; Kidney Disease: No; Lung Disease: No; Seizures: No; Stroke: Yes - Maternal Grandparents; Thyroid Problems: No; Tuberculosis: No; Never smoker; Marital Status - Widowed; Alcohol Use: Rarely; Drug Use: Prior History - TCH; Caffeine Use: Daily - soda, tea; Financial Concerns: No; Food, Clothing or Shelter Needs: No; Support System Lacking: No; Transportation Concerns: No Electronic Signature(s) Signed: 03/21/2023 12:22:30 PM By: Duanne Guess MD FACS Entered By: Duanne Guess on 03/21/2023 12:06:39 -------------------------------------------------------------------------------- SuperBill Details Patient Name: Date of Service: [name] 03/21/2023 Medical Record Number:  [id number] Patient Account Number: [id number] Date of Birth/Sex: Treating RN: [birth year] (72 y.o. F) Primary Care Provider: Saralyn Pilar Other Clinician: Referring Provider: Treating Provider/Extender: Greer Pickerel in Treatment: 27 Diagnosis Coding ICD-10 Codes Code Description L89.314 Pressure ulcer of right buttock, stage 4 L89.614 Pressure ulcer of right heel, stage 4 I73.00 Raynaud's syndrome without gangrene I50.32 Chronic diastolic (congestive) heart failure N18.32 Chronic kidney disease, stage 3b R73.02 Impaired glucose tolerance (oral) E66.9 Obesity, unspecified Facility Procedures : CPT4 Code: 08657846 Description: 97597 - DEBRIDE WOUND 1ST 20 SQ CM OR < ICD-10 Diagnosis Description L89.614 Pressure ulcer of right heel, stage 4 Modifier: Quantity: 1 : CPT4 Code: 96295284 Description: 13244 - WOUND VAC-50 SQ CM OR LESS Modifier: Quantity: 1 Physician Procedures [name], [name] ([id number]): CPT4 Code Description [id number] 99214 - WC PHYS LEVEL 4 - EST PT ICD-10 Diagnosis Description L89.314 Pressure ulcer of right buttock, stage 4 L89.614 Pressure ulcer of right heel, stage 4 I73.00 Raynaud's syndrome without  gangrene I50.32 Chronic diastolic (congestive) heart failure [id number] Page 12 of 12: Quantity Modifier [address] [name], [name] ([id number]): [id number] - WC PHYS DEBR WO ANESTH 20 SQ CM ICD-10 Diagnosis Description L89.614 Pressure ulcer of right heel, stage 4 [id number] Page 12 of 12: 1 Electronic Signature(s) Signed: 03/21/2023 12:14:04 PM By: Duanne Guess MD FACS Entered By: Duanne Guess on 03/21/2023 12:14:04

## 2023-04-02 ENCOUNTER — Other Ambulatory Visit (INDEPENDENT_AMBULATORY_CARE_PROVIDER_SITE_OTHER): Payer: Self-pay

## 2023-04-03 ENCOUNTER — Telehealth: Payer: Self-pay | Admitting: *Deleted

## 2023-04-03 DIAGNOSIS — S7291XD Unspecified fracture of right femur, subsequent encounter for closed fracture with routine healing: Secondary | ICD-10-CM

## 2023-04-03 DIAGNOSIS — L89314 Pressure ulcer of right buttock, stage 4: Secondary | ICD-10-CM | POA: Diagnosis not present

## 2023-04-03 DIAGNOSIS — N1832 Chronic kidney disease, stage 3b: Secondary | ICD-10-CM | POA: Diagnosis not present

## 2023-04-03 DIAGNOSIS — R7302 Impaired glucose tolerance (oral): Secondary | ICD-10-CM | POA: Diagnosis not present

## 2023-04-03 DIAGNOSIS — L89313 Pressure ulcer of right buttock, stage 3: Secondary | ICD-10-CM | POA: Diagnosis not present

## 2023-04-03 DIAGNOSIS — M79604 Pain in right leg: Secondary | ICD-10-CM

## 2023-04-03 DIAGNOSIS — I5032 Chronic diastolic (congestive) heart failure: Secondary | ICD-10-CM | POA: Diagnosis not present

## 2023-04-03 MED ORDER — OXYCODONE HCL 5 MG PO TABS
5.0000 mg | ORAL_TABLET | Freq: Four times a day (QID) | ORAL | 0 refills | Status: DC | PRN
Start: 2023-04-03 — End: 2023-05-10

## 2023-04-03 NOTE — Telephone Encounter (Signed)
Pt calling requesting something for pain, she said she is having excruciating pain with the wound especially when changing the dressing and Dr Lady Gary does not prescribe any pain medication and advised pt to call primary to see abut getting something called in and also she would like to have lidocaine gel 4% at the suggestion of Dr. Lady Gary. Please advise.

## 2023-04-03 NOTE — Telephone Encounter (Signed)
I sent in a prescription for oxycodone with the same instructions that Herbert Seta previously gave her:  I went ahead and sent new prescription for oxycodone 5 mg. The prescription says for three times daily as needed, however, I would like for you to take this only for dressing changes and severe pain. I was able to send #30 tablets to [place] [facility] for you. Let me know if you have any questions.   I did not see anything in any of Dr. America Brown notes about lidocaine gel, so I would recommend calling Dr. Lady Gary to clarify.  She should be able to send in that prescription if it is something that she would like you to do.  I would also encourage you to ask Dr. Lady Gary for pain medication in the future since she is managing your wound care!

## 2023-04-04 ENCOUNTER — Encounter (HOSPITAL_BASED_OUTPATIENT_CLINIC_OR_DEPARTMENT_OTHER): Payer: PPO | Admitting: General Surgery

## 2023-04-04 DIAGNOSIS — L89314 Pressure ulcer of right buttock, stage 4: Secondary | ICD-10-CM | POA: Diagnosis not present

## 2023-04-04 DIAGNOSIS — L89614 Pressure ulcer of right heel, stage 4: Secondary | ICD-10-CM | POA: Diagnosis not present

## 2023-04-04 NOTE — Progress Notes (Signed)
[name] ([id number]) [id number] Page 1 of 12 Visit Report for 04/04/2023 Chief Complaint Document Details Patient Name: Date of Service: [name] 04/04/2023 10:30 A M Medical Record Number: [id number] Patient Account Number: [id number] Date of Birth/Sex: Treating RN: [birth year] (72 y.o. [name] Primary Care Provider: Saralyn Pilar Other Clinician: Referring Provider: Treating Provider/Extender: Park Liter in Treatment: 29 Information Obtained from: Patient Chief Complaint Patient is at the clinic for treatment of open pressure ulcers Electronic Signature(s) Signed: 04/04/2023 11:37:01 AM By: Duanne Guess MD FACS Entered By: Duanne Guess on 04/04/2023 08:37:00 -------------------------------------------------------------------------------- Debridement Details Patient Name: Date of Service: [name]. 04/04/2023 10:30 A M Medical Record Number: [id number] Patient Account Number: [id number] Date of Birth/Sex: Treating RN: 02-04-1951 (72 y.o. [name] Primary Care Provider: Saralyn Pilar Other Clinician: Referring Provider: Treating Provider/Extender: Park Liter in Treatment: 29 Debridement Performed for Assessment: Wound #3 Right Calcaneus Performed By: Physician Duanne Guess, MD Debridement Type: Debridement Level of Consciousness (Pre-procedure): Awake and Alert Pre-procedure Verification/Time Out Yes - 11:05 Taken: Start Time: 11:06 Pain Control: Lidocaine 4% Marisa opical Solution Percent of Wound Bed Debrided: 80% Marisa Area Debrided (cm): otal 0.94 Tissue and other material debrided: Non-Viable, Slough, Slough Level: Non-Viable Tissue Debridement Description: Selective/Open Wound Instrument: Curette Bleeding: Minimum Hemostasis Achieved: Pressure Procedural Pain: 0 Post Procedural Pain: 0 Response to Treatment: Procedure was tolerated  well Level of Consciousness (Post- Awake and Alert procedure): Post Debridement Measurements of Total Wound Length: (cm) 0.6 Stage: Category/Stage IV Width: (cm) 2.5 Depth: (cm) 0.2 Volume: (cm) 0.236 Character of Wound/Ulcer Post Debridement: Improved Post Procedure Diagnosis [name], DUCK ([id number]) [id number] Page 2 of 12 Same as Pre-procedure Electronic Signature(s) Signed: 04/04/2023 1:07:25 PM By: Duanne Guess MD FACS Signed: 04/04/2023 4:36:46 PM By: Zenaida Deed RN, BSN Entered By: Zenaida Deed on 04/04/2023 08:10:56 -------------------------------------------------------------------------------- HPI Details Patient Name: Date of Service: Marisa Roux E. 04/04/2023 10:30 A M Medical Record Number: [id number] Patient Account Number: [id number] Date of Birth/Sex: Treating RN: 1951-05-26 (72 y.o. [name] Primary Care Provider: Saralyn Pilar Other Clinician: Referring Provider: Treating Provider/Extender: Park Liter in Treatment: 29 History of Present Illness HPI Description: ADMISSION 09/07/2023 This is a 71 year old woman with a past medical history notable for obesity, congestive heart failure, Raynaud's syndrome, CKD stage IIIb, osteoporosis, and rheumatoid arthritis. In November 2023, she suffered a fall that resulted in a femur fracture. She was hospitalized for about a week and underwent surgical repair of the fracture. She subsequently developed pressure ulcers on her right buttocks and ischium. She has been receiving home health services and they have been applying Medihoney. They have been reporting the wounds as stage II, but on evaluation, the large ulcer was probably unstageable at the time they were evaluating it but it is clearly a stage IV at this point. The smaller ulcer does have fat layer exposure and therefore is a stage III. The patient is accompanied by her daughter. She says  she has been sleeping on a regular bed but recently ordered an air mattress Marisa opper, but does not have it yet. She is on Ozempic for weight loss and therefore has a poor appetite and struggles to get adequate protein intake. 09/15/2022: The large stage IV ulcer is substantially cleaner this week. The stage III ulcer is a little smaller. Both have slough accumulation. The culture that I took last week was polymicrobial. The Augmentin that I prescribed  was adequate coverage for the species and she continues to take this. We have also ordered Keystone topical antibiotic compound, but this has not yet arrived. 09/21/2022: The stage IV ulcer has some necrotic muscle at the base but is otherwise fairly clean. The stage III ulcer is smaller again with light slough on the surface. She has her Keystone topical antibiotic compound with her today. 09/29/2022: There is still some necrotic muscle at the base of the stage IV ulcer that I was unable to get to last week. The stage III ulcer has healed. She unfortunately has developed a new pressure ulcer on her right heel. 10/06/2022: Still with some devitalized muscle at the base of the stage IV ulcer, but otherwise this wound is looking quite clean. The pressure induced tissue injury on her heel is demarcating and much of the area appears to be epithelialized, but there is still 2 areas that remain questionable. 10/12/2022: The stage IV ulcer is very clean and ready for wound VAC, which will be delivered tomorrow according to the patient. The tissue injury on her heel is drying up and does not feel particularly boggy this week. 10/19/2022: The heel injury continues to improve. There is still some dry eschar present on the more plantar aspect of it. There is some slough accumulation on the surface of the stage IV pressure ulcer. The wound VAC was initiated by home health last week. 11/01/2022: The gluteal pressure ulcer is very clean without any necrotic tissue or slough. The  depth has come in by over half a centimeter. The deep tissue injury on her heel continues to improve. There is still dry eschar that we are painting with Betadine as well as some fresh-looking viable tissue at the margin. 11/15/2022: The gluteal pressure ulcer is clean and contracting. The eschar on her heel ulcer is beginning to separate from the underlying tissue. 11/29/2022: The gluteal pressure ulcer is very clean. The depth has come in by about a centimeter. The eschar has completely separated off of her heel. There is some fibrinous exudate and slough on the heel. It has declared itself as a stage III at this point. 12/13/2022: The heel ulcer is smaller and much cleaner, but still has some non-viable tissue present. The orifice of the gluteal ulcer is contracting, but the depth remains the same. Fortunately, the sponge for the [facility] is being packed appropriately into the full depth of the wound. 12/27/2022: The heel ulcer is smaller, but I think the depth and degree of tissue injury has finally declared itself, with tendon being exposed at the posterior aspect of the ulcer. It is now stage IV. The gluteal ulcer continues to contract circumferentially, but the depth is still unchanged. 01/10/2023: The heel ulcer has contracted further. There is still rubbery slough on the surface. There has been no further tissue breakdown; I think the last of the nonviable tissue was removed at her visit 2 weeks ago. The gluteal ulcer has contracted further circumferentially, but there is still a tunnel that ankles off cranially that remains about the same depth. The tissues appear viable at both sites. 01/24/2023: Unfortunately, the home health nurse that was applying the wound VAC did not put any drape on her skin for the bridge and was applying sponge directly to the patient's skin. This is resulted in significant tissue breakdown and pain for the patient. Once we were notified of this, we discontinued the wound VAC and  they have been packing the wound with saline moistened gauze. The  heel has also deteriorated. Bone is now exposed. 01/30/2023: Her skin looks significantly better this week. It has recovered from the insult caused by applying the sponge directly to it. Her heel looks a little bit better as well. The culture that I took last week grew out group A strep, Proteus mirabilis, and Enterococcus, along with other skin flora. Although Levaquin was the recommended antibiotic, she is on amiodarone and therefore Levaquin is contraindicated. She has been taking Augmentin and is showing signs of improvement. 02/07/2023: The insult to her skin caused by direct application of wound VAC sponge has nearly completely healed. The gluteal ulcer cavity continues to contract circumferentially, but still probes quite deeply; I suspect the sponge for her VAC is not getting placed completely down into that tunnel. The heel is Cooper, LUSH ([id number]) [id number] Page 3 of 12 looking better today. There is more granulation filling in. I did not feel bone today. 02/14/2023: The gluteal ulcer cavity has circumferentially contracted even further, although the depth remains the same. The heel has improved and there is good tissue overlying where the bone had been exposed. Both wounds have some slough on the surface. 02/21/2023: The gluteal ulcer cavity continues to contract and is about half a centimeter shallower this week. The surface is clean without any slough or other debris accumulation. The heel continues to fill in granulation tissue over where the bone had been exposed. There is some slough on the wound surface. 03/07/2023: The gluteal ulcer was measured slightly deeper today, but on palpation, it feels roughly the same. The cavity is certainly more contracted. She was complaining of more pain in the area and it appears that GranuFoam was applied directly to her skin and she has some irritation from  this. The heel ulcer is filling in with excellent granulation tissue. It does not probe to bone any longer. Minimal slough accumulation. 03/21/2023: The gluteal ulcer is shallower and the cavity is tighter. She has some periwound skin breakdown that we have been trying to address by applying DuoDERM to the skin around the wound opening. The home health nurse also added nystatin powder to address some yeasty-looking areas. The heel ulcer is much shallower and smaller. Minimal slough and biofilm buildup. 04/04/2023: The gluteal ulcer continues to get shallower and narrower. The periwound skin looks much better this week. The heel ulcer has a band of epithelialized tissue separating the site into 2 openings. There is very little depth remaining and the wounds are quite clean with minimal slough on the surface. Electronic Signature(s) Signed: 04/04/2023 11:37:53 AM By: Duanne Guess MD FACS Entered By: Duanne Guess on 04/04/2023 08:37:52 -------------------------------------------------------------------------------- Physical Exam Details Patient Name: Date of Service: [name] 04/04/2023 10:30 A M Medical Record Number: [id number] Patient Account Number: [id number] Date of Birth/Sex: Treating RN: Apr 13, 1951 (72 y.o. [name] Primary Care Provider: Saralyn Pilar Other Clinician: Referring Provider: Treating Provider/Extender: [name] Weeks in Treatment: 29 Constitutional . . . . no acute distress. Respiratory Normal work of breathing on room air. Notes 04/04/2023: The gluteal ulcer continues to get shallower and narrower. The periwound skin looks much better this week. The heel ulcer has a band of epithelialized tissue separating the site into 2 openings. There is very little depth remaining and the wounds are quite clean with minimal slough on the surface. Electronic Signature(s) Signed: 04/04/2023 11:38:20 AM By: Duanne Guess MD  FACS Entered By: Duanne Guess on 04/04/2023 08:38:20 -------------------------------------------------------------------------------- Physician Orders Details Patient Name:  Date of Service: JAYELYNN, PETERSHEIM 04/04/2023 10:30 A M Medical Record Number: [id number] Patient Account Number: [id number] Date of Birth/Sex: Treating RN: [birth year] (72 y.o. [name] Primary Care Provider: Saralyn Pilar Other Clinician: Referring Provider: Treating Provider/Extender: Park Liter in Treatment: 81 Verbal / Phone Orders: No Diagnosis Coding ICD-10 Coding Code Description AMILEE, PARKINS ([id number]) [id number] Page 4 of 12 L89.314 Pressure ulcer of right buttock, stage 4 L89.614 Pressure ulcer of right heel, stage 4 I73.00 Raynaud's syndrome without gangrene I50.32 Chronic diastolic (congestive) heart failure N18.32 Chronic kidney disease, stage 3b R73.02 Impaired glucose tolerance (oral) E66.9 Obesity, unspecified Follow-up Appointments ppointment in 2 weeks. - Dr Lady Gary Rm 1 Return A Wed 9/4 @ 12:30 pm Anesthetic Wound #1 Right Gluteus (In clinic) Topical Lidocaine 4% applied to wound bed Bathing/ Shower/ Hygiene May shower and wash wound with soap and water. Negative Presssure Wound Therapy Wound #1 Right Gluteus Wound Vac to wound continuously at 139mm/hg pressure Black Foam Off-Loading Heel suspension boot to: - globoped right foot to ambulate Low air-loss mattress (Group 2) - Adapt Turn and reposition every 2 hours - avoid lying on back, stand at least every hour while out of bed, float heels off bed with pillows under calves while in bed Prevalon Boot - right foot esp. while in bed. Additional Orders / Instructions Follow Nutritious Diet - increase protein to 70-80 gms per day, hold ozempic Juven Shake 1-2 times daily. Home Health No change in wound care orders this week; continue Home Health for wound care.  May utilize formulary equivalent dressing for wound treatment orders unless otherwise specified. Other Home Health Orders/Instructions: - [facility] Wound Treatment Wound #1 - Gluteus Wound Laterality: Right Cleanser: Wound Cleanser 3 x Per Week/30 Days Discharge Instructions: Cleanse the wound with wound cleanser prior to applying a clean dressing using gauze sponges, not tissue or cotton balls. Peri-Wound Care: Nystop Powder (Nystatin) 3 x Per Week/30 Days Discharge Instructions: Apply Nystop (Nystatin) Powder as needed to irritated skin Prim Dressing: Promogran Prisma Matrix, 4.34 (sq in) (silver collagen) 3 x Per Week/30 Days ary Discharge Instructions: Moisten collagen with saline or hydrogel Prim Dressing: Maxorb Extra Calcium Alginate, 2x2 (in/in) 3 x Per Week/30 Days ary Discharge Instructions: Apply to weeping periwound under drape Prim Dressing: 64M Marisa ary egaderm Hydrocolloid Dressing, 4x4 (in/in) 3 x Per Week/30 Days Discharge Instructions: to periwound to protect Prim Dressing: VAC ary 3 x Per Week/30 Days Wound #3 - Calcaneus Wound Laterality: Right Topical: Gentamicin 1 x Per Day/30 Days Discharge Instructions: mix 50:50 with mupirocin and rub into wound bed Topical: Mupirocin Ointment 1 x Per Day/30 Days Discharge Instructions: Apply Mupirocin (Bactroban) as instructed Prim Dressing: Maxorb Extra Ag+ Alginate Dressing, 4x4.75 (in/in) 1 x Per Day/30 Days ary Discharge Instructions: Apply to wound bed as instructed Secondary Dressing: ALLEVYN Heel 4 1/2in x 5 1/2in / 10.5cm x 13.5cm 1 x Per Day/30 Days Discharge Instructions: Apply over primary dressing as directed. Secondary Dressing: Woven Gauze Sponge, Non-Sterile 4x4 in 1 x Per Day/30 Days Discharge Instructions: Apply over primary dressing as directed. ATZI, KETCHEN ([id number]) [id number] Page 5 of 12 Secured With: [employer], 4.5x3.1 (in/yd) 1 x Per Day/30 Days Discharge  Instructions: Secure with Kerlix as directed. Compression Stockings: Circaid Juxta Lite Compression Wrap Right Leg Compression Amount: 30-40 mmHG Discharge Instructions: Apply Circaid Juxta Lite Compression Wrap daily as instructed. Apply first thing in the morning, remove at night before bed. Electronic Signature(s) Signed:  04/04/2023 1:07:25 PM By: Duanne Guess MD FACS Entered By: Duanne Guess on 04/04/2023 08:39:37 -------------------------------------------------------------------------------- Problem List Details Patient Name: Date of Service: [name]. 04/04/2023 10:30 A M Medical Record Number: [id number] Patient Account Number: [id number] Date of Birth/Sex: Treating RN: August 02, 1951 (72 y.o. [name] Primary Care Provider: Saralyn Pilar Other Clinician: Referring Provider: Treating Provider/Extender: [name] Weeks in Treatment: 29 Active Problems ICD-10 Encounter Code Description Active Date MDM Diagnosis L89.314 Pressure ulcer of right buttock, stage 4 09/07/2022 No Yes L89.614 Pressure ulcer of right heel, stage 4 09/28/2022 No Yes I73.00 Raynaud's syndrome without gangrene 09/07/2022 No Yes I50.32 Chronic diastolic (congestive) heart failure 09/07/2022 No Yes N18.32 Chronic kidney disease, stage 3b 09/07/2022 No Yes R73.02 Impaired glucose tolerance (oral) 09/07/2022 No Yes E66.9 Obesity, unspecified 09/07/2022 No Yes Inactive Problems ICD-10 Code Description Active Date Inactive Date L89.313 Pressure ulcer of right buttock, stage 3 09/07/2022 09/07/2022 Resolved Problems TOMIYA, SAMA ([id number]) [id number] Page 6 of 12 Electronic Signature(s) Signed: 04/04/2023 11:36:43 AM By: Duanne Guess MD FACS Entered By: Duanne Guess on 04/04/2023 08:36:43 -------------------------------------------------------------------------------- Progress Note Details Patient Name: Date of Service: Marisa Roux E. 04/04/2023 10:30 A M Medical Record Number: [id number] Patient Account Number: [id number] Date of Birth/Sex: Treating RN: 1950-11-17 (72 y.o. [name] Primary Care Provider: Saralyn Pilar Other Clinician: Referring Provider: Treating Provider/Extender: Park Liter in Treatment: 29 Subjective Chief Complaint Information obtained from Patient Patient is at the clinic for treatment of open pressure ulcers History of Present Illness (HPI) ADMISSION 09/07/2023 This is a 72 year old woman with a past medical history notable for obesity, congestive heart failure, Raynaud's syndrome, CKD stage IIIb, osteoporosis, and rheumatoid arthritis. In November 2023, she suffered a fall that resulted in a femur fracture. She was hospitalized for about a week and underwent surgical repair of the fracture. She subsequently developed pressure ulcers on her right buttocks and ischium. She has been receiving home health services and they have been applying Medihoney. They have been reporting the wounds as stage II, but on evaluation, the large ulcer was probably unstageable at the time they were evaluating it but it is clearly a stage IV at this point. The smaller ulcer does have fat layer exposure and therefore is a stage III. The patient is accompanied by her daughter. She says she has been sleeping on a regular bed but recently ordered an air mattress Marisa opper, but does not have it yet. She is on Ozempic for weight loss and therefore has a poor appetite and struggles to get adequate protein intake. 09/15/2022: The large stage IV ulcer is substantially cleaner this week. The stage III ulcer is a little smaller. Both have slough accumulation. The culture that I took last week was polymicrobial. The Augmentin that I prescribed was adequate coverage for the species and she continues to take this. We have also ordered Keystone topical antibiotic compound, but this has not yet  arrived. 09/21/2022: The stage IV ulcer has some necrotic muscle at the base but is otherwise fairly clean. The stage III ulcer is smaller again with light slough on the surface. She has her Keystone topical antibiotic compound with her today. 09/29/2022: There is still some necrotic muscle at the base of the stage IV ulcer that I was unable to get to last week. The stage III ulcer has healed. She unfortunately has developed a new pressure ulcer on her right heel. 10/06/2022: Still with some devitalized muscle at  the base of the stage IV ulcer, but otherwise this wound is looking quite clean. The pressure induced tissue injury on her heel is demarcating and much of the area appears to be epithelialized, but there is still 2 areas that remain questionable. 10/12/2022: The stage IV ulcer is very clean and ready for wound VAC, which will be delivered tomorrow according to the patient. The tissue injury on her heel is drying up and does not feel particularly boggy this week. 10/19/2022: The heel injury continues to improve. There is still some dry eschar present on the more plantar aspect of it. There is some slough accumulation on the surface of the stage IV pressure ulcer. The wound VAC was initiated by home health last week. 11/01/2022: The gluteal pressure ulcer is very clean without any necrotic tissue or slough. The depth has come in by over half a centimeter. The deep tissue injury on her heel continues to improve. There is still dry eschar that we are painting with Betadine as well as some fresh-looking viable tissue at the margin. 11/15/2022: The gluteal pressure ulcer is clean and contracting. The eschar on her heel ulcer is beginning to separate from the underlying tissue. 11/29/2022: The gluteal pressure ulcer is very clean. The depth has come in by about a centimeter. The eschar has completely separated off of her heel. There is some fibrinous exudate and slough on the heel. It has declared itself as a  stage III at this point. 12/13/2022: The heel ulcer is smaller and much cleaner, but still has some non-viable tissue present. The orifice of the gluteal ulcer is contracting, but the depth remains the same. Fortunately, the sponge for the [facility] is being packed appropriately into the full depth of the wound. 12/27/2022: The heel ulcer is smaller, but I think the depth and degree of tissue injury has finally declared itself, with tendon being exposed at the posterior aspect of the ulcer. It is now stage IV. The gluteal ulcer continues to contract circumferentially, but the depth is still unchanged. 01/10/2023: The heel ulcer has contracted further. There is still rubbery slough on the surface. There has been no further tissue breakdown; I think the last of the nonviable tissue was removed at her visit 2 weeks ago. The gluteal ulcer has contracted further circumferentially, but there is still a tunnel that ankles off cranially that remains about the same depth. The tissues appear viable at both sites. 01/24/2023: Unfortunately, the home health nurse that was applying the wound VAC did not put any drape on her skin for the bridge and was applying sponge directly to the patient's skin. This is resulted in significant tissue breakdown and pain for the patient. Once we were notified of this, we discontinued the wound VAC and they have been packing the wound with saline moistened gauze. The heel has also deteriorated. Bone is now exposed. 01/30/2023: Her skin looks significantly better this week. It has recovered from the insult caused by applying the sponge directly to it. Her heel looks a little bit better as well. The culture that I took last week grew out group A strep, Proteus mirabilis, and Enterococcus, along with other skin flora. Although Levaquin was the recommended antibiotic, she is on amiodarone and therefore Levaquin is contraindicated. She has been taking Augmentin and is showing signs  of improvement. CHRISTIANE, [name] ([id number]) [id number] Page 7 of 12 02/07/2023: The insult to her skin caused by direct application of wound VAC sponge has nearly completely healed. The gluteal  ulcer cavity continues to contract circumferentially, but still probes quite deeply; I suspect the sponge for her VAC is not getting placed completely down into that tunnel. The heel is looking better today. There is more granulation filling in. I did not feel bone today. 02/14/2023: The gluteal ulcer cavity has circumferentially contracted even further, although the depth remains the same. The heel has improved and there is good tissue overlying where the bone had been exposed. Both wounds have some slough on the surface. 02/21/2023: The gluteal ulcer cavity continues to contract and is about half a centimeter shallower this week. The surface is clean without any slough or other debris accumulation. The heel continues to fill in granulation tissue over where the bone had been exposed. There is some slough on the wound surface. 03/07/2023: The gluteal ulcer was measured slightly deeper today, but on palpation, it feels roughly the same. The cavity is certainly more contracted. She was complaining of more pain in the area and it appears that GranuFoam was applied directly to her skin and she has some irritation from this. The heel ulcer is filling in with excellent granulation tissue. It does not probe to bone any longer. Minimal slough accumulation. 03/21/2023: The gluteal ulcer is shallower and the cavity is tighter. She has some periwound skin breakdown that we have been trying to address by applying DuoDERM to the skin around the wound opening. The home health nurse also added nystatin powder to address some yeasty-looking areas. The heel ulcer is much shallower and smaller. Minimal slough and biofilm buildup. 04/04/2023: The gluteal ulcer continues to get shallower and narrower. The  periwound skin looks much better this week. The heel ulcer has a band of epithelialized tissue separating the site into 2 openings. There is very little depth remaining and the wounds are quite clean with minimal slough on the surface. Patient History Information obtained from Patient, Caregiver, Chart. Family History Cancer - Father,Paternal Grandparents, Heart Disease - Mother,Father,Paternal Grandparents, Hypertension - Mother, Stroke - Maternal Grandparents, No family history of Diabetes, Hereditary Spherocytosis, Kidney Disease, Lung Disease, Seizures, Thyroid Problems, Tuberculosis. 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 Medical History Eyes Denies history of Cataracts, Glaucoma, Optic Neuritis Ear/Nose/Mouth/Throat Patient has history of Chronic sinus problems/congestion - chronic rhinitis Denies history of Middle ear problems Respiratory Patient has history of Sleep Apnea - uses CPAP Cardiovascular Patient has history of Congestive Heart Failure Endocrine Denies history of Type I Diabetes, Type II Diabetes Genitourinary Denies history of End Stage Renal Disease Immunological Patient has history of Raynauds Denies history of Lupus Erythematosus, Scleroderma Integumentary (Skin) Denies history of History of Burn Musculoskeletal Patient has history of Rheumatoid Arthritis Denies history of Gout, Osteoarthritis, Osteomyelitis Neurologic Patient has history of Neuropathy Denies history of Dementia, Quadriplegia, Paraplegia, Seizure Disorder Oncologic Denies history of Received Chemotherapy, Received Radiation Psychiatric Denies history of Anorexia/bulimia, Confinement Anxiety Hospitalization/Surgery History - right femur fx ORIR. - right knee replacement. - left breast mass excision. - left ankle tibia repair. - abdominal hysterectomy. - adnoidectomy/tonsillectomy. -  cholecystectomy. - dental implants. - patoid cystectomy. - tubal ligation. Medical A Surgical History Notes nd Constitutional Symptoms (General Health) morbid obesity Cardiovascular hyperlipidemia Gastrointestinal GERD, IBS, eosinophilic esophagitis, diverticulosis, Genitourinary CKD stage 3 Immunological Sjoegren syndrome, fibromyalgia Objective Constitutional no acute distress. KEYONDRA, WELCH ([id number]) [id number] Page 8 of 12 Vitals Time Taken: 10:37 AM, Height: 62 in, Weight:  207 lbs, BMI: 37.9, Temperature: 98.6 F, Pulse: 63 bpm, Respiratory Rate: 18 breaths/min, Blood Pressure: 133/55 mmHg. Respiratory Normal work of breathing on room air. General Notes: 04/04/2023: The gluteal ulcer continues to get shallower and narrower. The periwound skin looks much better this week. The heel ulcer has a band of epithelialized tissue separating the site into 2 openings. There is very little depth remaining and the wounds are quite clean with minimal slough on the surface. Integumentary (Hair, Skin) Wound #1 status is Open. Original cause of wound was Pressure Injury. The date acquired was: 08/11/2022. The wound has been in treatment 29 weeks. The wound is located on the Right Gluteus. The wound measures 3cm length x 4.2cm width x 5.7cm depth; 9.896cm^2 area and 56.407cm^3 volume. There is Fat Layer (Subcutaneous Tissue) exposed. There is no tunneling or undermining noted. There is a medium amount of serosanguineous drainage noted. The wound margin is well defined and not attached to the wound base. There is large (67-100%) red granulation within the wound bed. There is a small (1-33%) amount of necrotic tissue within the wound bed including Adherent Slough. The periwound skin appearance had no abnormalities noted for moisture. The periwound skin appearance had no abnormalities noted for color. The periwound skin appearance did not exhibit: Callus, Crepitus,  Excoriation, Induration, Rash, Scarring. Periwound temperature was noted as No Abnormality. The periwound has tenderness on palpation. Wound #3 status is Open. Original cause of wound was Pressure Injury. The date acquired was: 09/21/2022. The wound has been in treatment 26 weeks. The wound is located on the Right Calcaneus. The wound measures 0.6cm length x 2.5cm width x 0.2cm depth; 1.178cm^2 area and 0.236cm^3 volume. There is Fat Layer (Subcutaneous Tissue) exposed. There is no tunneling or undermining noted. There is a medium amount of serosanguineous drainage noted. The wound margin is distinct with the outline attached to the wound base. There is large (67-100%) red granulation within the wound bed. There is a small (1-33%) amount of necrotic tissue within the wound bed including Adherent Slough. The periwound skin appearance had no abnormalities noted for texture. The periwound skin appearance had no abnormalities noted for moisture. The periwound skin appearance had no abnormalities noted for color. Periwound temperature was noted as No Abnormality. The periwound has tenderness on palpation. 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 There was a Selective/Open Wound Non-Viable Tissue Debridement with a total area of 0.94 sq cm performed by Duanne Guess, MD. With the following instrument(s): Curette to remove Non-Viable tissue/material. Material removed includes [facility] after achieving pain control using Lidocaine 4% Topical Solution. No specimens were taken. A time out was conducted at 11:05, prior to the start of the procedure. A Minimum  amount of bleeding was controlled with Pressure. The procedure was tolerated well with a pain level of 0 throughout and a pain level of 0 following the procedure. Post Debridement Measurements: 0.6cm length x 2.5cm width x 0.2cm depth; 0.236cm^3 volume. Post debridement Stage noted as Category/Stage IV. Character of Wound/Ulcer Post Debridement is improved. Post procedure Diagnosis Wound #3: Same as Pre-Procedure Plan Follow-up Appointments: Return Appointment in 2 weeks. - Dr Lady Gary Rm 1 Wed 9/4 @ 12:30 pm Anesthetic: Wound #1 Right Gluteus: (  In clinic) Topical Lidocaine 4% applied to wound bed Bathing/ Shower/ Hygiene: May shower and wash wound with soap and water. Negative Presssure Wound Therapy: Wound #1 Right Gluteus: Wound Vac to wound continuously at 126mm/hg pressure Black Foam Off-Loading: Heel suspension boot to: - globoped right foot to ambulate Low air-loss mattress (Group 2) - Adapt Turn and reposition every 2 hours - avoid lying on back, stand at least every hour while out of bed, float heels off bed with pillows under calves while in bed Prevalon Boot - right foot esp. while in bed. Additional Orders / Instructions: Follow Nutritious Diet - increase protein to 70-80 gms per day, hold ozempic Cooper, PARKISON ([id number]) [id number] Page 9 of 12 Juven Shake 1-2 times daily. Home Health: No change in wound care orders this week; continue Home Health for wound care. May utilize formulary equivalent dressing for wound treatment orders unless otherwise specified. Other Home Health Orders/Instructions: - [facility] WOUND #1: - Gluteus Wound Laterality: Right Cleanser: Wound Cleanser 3 x Per Week/30 Days Discharge Instructions: Cleanse the wound with wound cleanser prior to applying a clean dressing using gauze sponges, not tissue or cotton balls. Peri-Wound Care: Nystop Powder (Nystatin) 3 x Per Week/30 Days Discharge Instructions: Apply Nystop  (Nystatin) Powder as needed to irritated skin Prim Dressing: Promogran Prisma Matrix, 4.34 (sq in) (silver collagen) 3 x Per Week/30 Days ary Discharge Instructions: Moisten collagen with saline or hydrogel Prim Dressing: Maxorb Extra Calcium Alginate, 2x2 (in/in) 3 x Per Week/30 Days ary Discharge Instructions: Apply to weeping periwound under drape Prim Dressing: 53M Marisa ary egaderm Hydrocolloid Dressing, 4x4 (in/in) 3 x Per Week/30 Days Discharge Instructions: to periwound to protect Prim Dressing: VAC 3 x Per Week/30 Days ary WOUND #3: - Calcaneus Wound Laterality: Right Topical: Gentamicin 1 x Per Day/30 Days Discharge Instructions: mix 50:50 with mupirocin and rub into wound bed Topical: Mupirocin Ointment 1 x Per Day/30 Days Discharge Instructions: Apply Mupirocin (Bactroban) as instructed Prim Dressing: Maxorb Extra Ag+ Alginate Dressing, 4x4.75 (in/in) 1 x Per Day/30 Days ary Discharge Instructions: Apply to wound bed as instructed Secondary Dressing: ALLEVYN Heel 4 1/2in x 5 1/2in / 10.5cm x 13.5cm 1 x Per Day/30 Days Discharge Instructions: Apply over primary dressing as directed. Secondary Dressing: Woven Gauze Sponge, Non-Sterile 4x4 in 1 x Per Day/30 Days Discharge Instructions: Apply over primary dressing as directed. Secured With: [employer], 4.5x3.1 (in/yd) 1 x Per Day/30 Days Discharge Instructions: Secure with Kerlix as directed. Com pression Stockings: Circaid Juxta Lite Compression Wrap Compression Amount: 30-40 mmHg (right) Discharge Instructions: Apply Circaid Juxta Lite Compression Wrap daily as instructed. Apply first thing in the morning, remove at night before bed. 04/04/2023: The gluteal ulcer continues to get shallower and narrower. The periwound skin looks much better this week. The heel ulcer has a band of epithelialized tissue separating the site into 2 openings. There is very little depth remaining and the wounds are quite clean with minimal slough  on the surface. The gluteal ulcer did not require any debridement. We will continue negative pressure wound therapy with Prisma silver collagen at the base. I debrided slough from the heel ulcer. We will continue the mixture of gentamicin and mupirocin with silver alginate, and a heel cup, and a heel offloading shoe. Follow-up in 2 weeks. Electronic Signature(s) Signed: 04/04/2023 11:40:24 AM By: Duanne Guess MD FACS Entered By: Duanne Guess on 04/04/2023 08:40:24 -------------------------------------------------------------------------------- HxROS Details Patient Name: Date of Service: [place] Dalbert Garnet, Larita Fife E. 04/04/2023 10:30 A  M Medical Record Number: [id number] Patient Account Number: [id number] Date of Birth/Sex: Treating RN: 14-Aug-1951 (72 y.o. [name] Primary Care Provider: Saralyn Pilar Other Clinician: Referring Provider: Treating Provider/Extender: Park Liter in Treatment: 29 Information Obtained From Patient Caregiver Chart Constitutional Symptoms (General Health) Medical History: Past Medical History Notes: morbid obesity Eyes Medical History: Negative for: Cataracts; Glaucoma; Optic Neuritis SARALYNN, TORSIELLO ([id number]) [id number] Page 10 of 12 Ear/Nose/Mouth/Throat Medical History: Positive for: Chronic sinus problems/congestion - chronic rhinitis Negative for: Middle ear problems Respiratory Medical History: Positive for: Sleep Apnea - uses CPAP Cardiovascular Medical History: Positive for: Congestive Heart Failure Past Medical History Notes: hyperlipidemia Gastrointestinal Medical History: Past Medical History Notes: GERD, IBS, eosinophilic esophagitis, diverticulosis, Endocrine Medical History: Negative for: Type I Diabetes; Type II Diabetes Genitourinary Medical History: Negative for: End Stage Renal Disease Past Medical History Notes: CKD stage 3 Immunological Medical  History: Positive for: Raynauds Negative for: Lupus Erythematosus; Scleroderma Past Medical History Notes: Sjoegren syndrome, fibromyalgia Integumentary (Skin) Medical History: Negative for: History of Burn Musculoskeletal Medical History: Positive for: Rheumatoid Arthritis Negative for: Gout; Osteoarthritis; Osteomyelitis Neurologic Medical History: Positive for: Neuropathy Negative for: Dementia; Quadriplegia; Paraplegia; Seizure Disorder Oncologic Medical History: Negative for: Received Chemotherapy; Received Radiation Psychiatric Medical History: Negative for: Anorexia/bulimia; Confinement Anxiety HBO Extended History Items Ear/Nose/Mouth/Throat: Chronic sinus problems/congestion Immunizations Pneumococcal Vaccine: Received Pneumococcal Vaccination: Yes Received Pneumococcal Vaccination On or After [name]AMANDAH, KOORS ([id number]) [id number] Page 11 of 12 Implantable Devices None Hospitalization / Surgery History Type of Hospitalization/Surgery right femur fx ORIR right knee replacement left breast mass excision left ankle tibia repair abdominal hysterectomy adnoidectomy/tonsillectomy cholecystectomy dental implants patoid cystectomy tubal ligation Family and Social History Cancer: Yes - Father,Paternal Grandparents; Diabetes: No; Heart Disease: Yes - Mother,Father,Paternal Grandparents; Hereditary Spherocytosis: No; Hypertension: Yes - Mother; Kidney Disease: No; Lung Disease: No; Seizures: No; Stroke: Yes - Maternal Grandparents; Thyroid Problems: No; Tuberculosis: No; Never smoker; Marital Status - Widowed; Alcohol Use: Rarely; Drug Use: Prior History - TCH; Caffeine Use: Daily - soda, tea; Financial Concerns: No; Food, Clothing or Shelter Needs: No; Support System Lacking: No; Transportation Concerns: No Psychologist, prison and probation services) Signed: 04/04/2023 1:07:25 PM By: Duanne Guess MD FACS Signed: 04/04/2023 4:36:46 PM By:  Zenaida Deed RN, BSN Entered By: Duanne Guess on 04/04/2023 08:37:59 -------------------------------------------------------------------------------- SuperBill Details Patient Name: Date of Service: [name] 04/04/2023 Medical Record Number: [id number] Patient Account Number: [id number] Date of Birth/Sex: Treating RN: 07-Nov-1950 (72 y.o. Billy Coast, [name] Primary Care Provider: Saralyn Pilar Other Clinician: Referring Provider: Treating Provider/Extender: [name] Weeks in Treatment: 29 Diagnosis Coding ICD-10 Codes Code Description L89.314 Pressure ulcer of right buttock, stage 4 L89.614 Pressure ulcer of right heel, stage 4 I73.00 Raynaud's syndrome without gangrene I50.32 Chronic diastolic (congestive) heart failure N18.32 Chronic kidney disease, stage 3b R73.02 Impaired glucose tolerance (oral) E66.9 Obesity, unspecified Facility Procedures : CPT4 Code: 63016010 Description: 97597 - DEBRIDE WOUND 1ST 20 SQ CM OR < ICD-10 Diagnosis Description L89.614 Pressure ulcer of right heel, stage 4 Modifier: Quantity: 1 : CPT4 Code: [id number] Description: 97605 - WOUND VAC-50 SQ CM OR LESS Modifier: 59 Quantity: 1 Physician Procedures : CPT4 Code Description Modifier [id number] - WC PHYS LEVEL 4 - EST PT 25 ICD-10 Diagnosis Description L89.314 Pressure ulcer of right buttock, stage 4 Drew, Kahlie E ([id number]) [id number] L89.614 Pressure ulcer of right  heel, stage 4 I73.00 Raynaud's syndrome without gangrene I50.32 Chronic diastolic (congestive) heart failure Quantity: 1  df Page 12 of 12 : [id number] 97597 - WC PHYS DEBR WO ANESTH 20 SQ CM 1 ICD-10 Diagnosis Description L89.614 Pressure ulcer of right heel, stage 4 Quantity: Electronic Signature(s) Signed: 04/04/2023 1:07:25 PM By: Duanne Guess MD FACS Signed: 04/04/2023 4:36:46 PM By: Zenaida Deed RN, BSN Previous Signature: 04/04/2023 11:40:57 AM Version By:  Duanne Guess MD FACS Entered By: Zenaida Deed on 04/04/2023 08:41:55

## 2023-04-04 NOTE — Telephone Encounter (Signed)
Informed pt of below and she said that Dr. Lady Gary and the wound center will NOT prescribe pain meds.  They refer their patients to primary care for those request she said. She is thankful for the medicine that has been sent in.

## 2023-04-04 NOTE — Progress Notes (Signed)
[name] ([id number]) [phone].pdf Page 1 of 10 Visit Report for 04/04/2023 Arrival Information Details Patient Name: Date of Service: [name], [name] 04/04/2023 10:30 A M Medical Record Number: [id number] Patient Account Number: [id number] Date of Birth/Sex: Treating RN: 23-Jan-1951 (72 y.o. [name] Primary Care Esgar Barnick: Saralyn Pilar Other Clinician: Referring Smith Mcnicholas: Treating Radames Mejorado/Extender: Park Liter in Treatment: 29 Visit Information History Since Last Visit Added or deleted any medications: No Patient Arrived: [name] Any new allergies or adverse reactions: No Arrival Time: 10:36 Had a fall or experienced change in No Accompanied By: self activities of daily living that may affect Transfer Assistance: None risk of falls: Patient Identification Verified: Yes Signs or symptoms of abuse/neglect since last visito No Secondary Verification Process Completed: Yes Hospitalized since last visit: No Patient Requires Transmission-Based Precautions: No Implantable device outside of the clinic excluding No Patient Has Alerts: No cellular tissue based products placed in the center since last visit: Has Dressing in Place as Prescribed: Yes Has Footwear/Offloading in Place as Prescribed: Yes Right: Other:globoped Pain Present Now: Yes Electronic Signature(s) Signed: 04/04/2023 4:36:46 PM By: Zenaida Deed RN, BSN Entered By: Zenaida Deed on 04/04/2023 07:37:22 -------------------------------------------------------------------------------- Encounter Discharge Information Details Patient Name: Date of Service: [name] E. 04/04/2023 10:30 A M Medical Record Number: [id number] Patient Account Number: [id number] Date of Birth/Sex: Treating RN: [birth year] (72 y.o. [name] Primary Care Chaitanya Amedee: Saralyn Pilar Other Clinician: Referring Tanisha Lutes: Treating Kamee Bobst/Extender: Park Liter in Treatment: 29 Encounter Discharge Information Items Post Procedure Vitals Discharge Condition: Stable Temperature (F): 98.6 Ambulatory Status: Walker Pulse (bpm): 63 Discharge Destination: Home Respiratory Rate (breaths/min): 18 Transportation: Private Auto Blood Pressure (mmHg): 133/55 Accompanied By: self Schedule Follow-up Appointment: Yes Clinical Summary of Care: Patient Declined Electronic Signature(s) Signed: 04/04/2023 4:36:46 PM By: Zenaida Deed RN, BSN Entered By: Zenaida Deed on 04/04/2023 08:41:24 [name] ([id number]) [id number] Page 2 of 10 -------------------------------------------------------------------------------- Lower Extremity Assessment Details Patient Name: Date of Service: [name], [name] 04/04/2023 10:30 A M Medical Record Number: [id number] Patient Account Number: [id number] Date of Birth/Sex: Treating RN: 1950/12/22 (72 y.o. [name] Primary Care Zelphia Glover: Saralyn Pilar Other Clinician: Referring Jamara Vary: Treating Naamah Boggess/Extender: [name] Weeks in Treatment: 29 Edema Assessment Assessed: [name]: No] [Right: No] Edema: [Left: Ye] [Right: s] Calf Left: Right: Point of Measurement: From Medial Instep 32 cm Ankle Left: Right: Point of Measurement: From Medial Instep 24 cm Vascular Assessment Pulses: Dorsalis Pedis Palpable: [Right:Yes] Extremity colors, hair growth, and conditions: Extremity Color: [Right:Normal] Hair Growth on Extremity: [Right:Yes] Temperature of Extremity: [Right:Warm] Capillary Refill: [Right:< 3 seconds] Dependent Rubor: [Right:No No] Electronic Signature(s) Signed: 04/04/2023 4:36:46 PM By: Zenaida Deed RN, BSN Entered By: Zenaida Deed on 04/04/2023 07:46:01 -------------------------------------------------------------------------------- Multi Wound Chart Details Patient Name: Date of Service: [name] E. 04/04/2023 10:30 A M Medical Record Number: [id number] Patient Account Number: [id number] Date of Birth/Sex: Treating RN: May 08, 1951 (72 y.o. [name] Primary Care Jadarian Mckay: Saralyn Pilar Other Clinician: Referring Cedra Villalon: Treating Kenyada Dosch/Extender: [name] Weeks in Treatment: 29 Vital Signs Height(in): 62 Pulse(bpm): 63 Weight(lbs): 207 Blood Pressure(mmHg): 133/55 Body Mass Index(BMI): 37.9 Temperature(F): 98.6 Respiratory Rate(breaths/min): 18 [1:Photos:] [N/A:N/A [id number] Page 3 of 10] Right Gluteus Right Calcaneus N/A Wound Location: Pressure Injury Pressure Injury N/A Wounding Event: Pressure Ulcer Pressure Ulcer N/A Primary Etiology: Chronic sinus problems/congestion, Chronic sinus problems/congestion, N/A Comorbid History: Sleep Apnea, Congestive Heart Sleep Apnea, Congestive Heart Failure,  Raynauds, Rheumatoid Failure, Raynauds, Rheumatoid Arthritis, Neuropathy Arthritis, Neuropathy 08/11/2022 09/21/2022 N/A Date Acquired: 29 26 N/A Weeks of Treatment: Open Open N/A Wound Status: No No N/A Wound Recurrence: 3x4.2x5.7 0.6x2.5x0.2 N/A Measurements L x W x D (cm) 9.896 1.178 N/A A (cm) : rea 56.407 0.236 N/A Volume (cm) : 55.00% 87.80% N/A % Reduction in A rea: 57.30% 75.60% N/A % Reduction in Volume: Category/Stage IV Category/Stage IV N/A Classification: Medium Medium N/A Exudate A mount: Serosanguineous Serosanguineous N/A Exudate Type: red, brown red, brown N/A Exudate Color: Well defined, not attached Distinct, outline attached N/A Wound Margin: Large (67-100%) Large (67-100%) N/A Granulation A mount: Red Red N/A Granulation Quality: Small (1-33%) Small (1-33%) N/A Necrotic A mount: Fat Layer (Subcutaneous Tissue): Yes Fat Layer (Subcutaneous Tissue): Yes N/A Exposed Structures: Fascia: No Fascia: No Tendon: No Tendon: No Muscle: No Muscle: No Joint: No Joint:  No Bone: No Bone: No Small (1-33%) Small (1-33%) N/A Epithelialization: N/A Debridement - Selective/Open Wound N/A Debridement: Pre-procedure Verification/Time Out N/A 11:05 N/A Taken: N/A Lidocaine 4% Topical Solution N/A Pain Control: N/A Slough N/A Tissue Debrided: N/A Non-Viable Tissue N/A Level: N/A 0.94 N/A Debridement A (sq cm): rea N/A Curette N/A Instrument: N/A Minimum N/A Bleeding: N/A Pressure N/A Hemostasis A chieved: N/A 0 N/A Procedural Pain: N/A 0 N/A Post Procedural Pain: N/A Procedure was tolerated well N/A Debridement Treatment Response: N/A 0.6x2.5x0.2 N/A Post Debridement Measurements L x W x D (cm) N/A 0.236 N/A Post Debridement Volume: (cm) N/A Category/Stage IV N/A Post Debridement Stage: Excoriation: No No Abnormalities Noted N/A Periwound Skin Texture: Induration: No Callus: No Crepitus: No Rash: No Scarring: No Maceration: No No Abnormalities Noted N/A Periwound Skin Moisture: Dry/Scaly: No Atrophie Blanche: No No Abnormalities Noted N/A Periwound Skin Color: Cyanosis: No Ecchymosis: No Erythema: No Hemosiderin Staining: No Mottled: No Pallor: No Rubor: No No Abnormality No Abnormality N/A Temperature: Yes Yes N/A Tenderness on Palpation: Negative Pressure Wound Therapy Debridement N/A Procedures Performed: Maintenance (NPWT) Treatment Notes Electronic Signature(s) Signed: 04/04/2023 11:36:54 AM By: Duanne Guess MD FACS Signed: 04/04/2023 4:36:46 PM By: Zenaida Deed RN, BSN Entered By: Duanne Guess on 04/04/2023 08:36:54 [name] ([id number]) [id number] Page 4 of 10 -------------------------------------------------------------------------------- Multi-Disciplinary Care Plan Details Patient Name: Date of Service: [name], [name] 04/04/2023 10:30 A M Medical Record Number: [id number] Patient Account Number: [id number] Date of Birth/Sex: Treating RN: [birth year] (72 y.o. [name] Primary Care Daine Croker: Saralyn Pilar Other Clinician: Referring Garrit Marrow: Treating Dazani Norby/Extender: Park Liter in Treatment: 29 Multidisciplinary Care Plan reviewed with physician Active Inactive Pressure Nursing Diagnoses: Knowledge deficit related to management of pressures ulcers Goals: Patient will remain free of pressure ulcers Date Initiated: 09/15/2022 Date Inactivated: 09/28/2022 Target Resolution Date: 11/09/2022 Unmet Reason: new pressure ulcer Goal Status: Unmet right heel Patient/caregiver will verbalize risk factors for pressure ulcer development Date Initiated: 09/15/2022 Target Resolution Date: 05/03/2023 Goal Status: Active Interventions: Assess potential for pressure ulcer upon admission and as needed Treatment Activities: Patient referred for pressure reduction/relief devices : 09/15/2022 Pressure reduction/relief device ordered : 09/15/2022 Notes: Wound/Skin Impairment Nursing Diagnoses: Impaired tissue integrity Knowledge deficit related to ulceration/compromised skin integrity Goals: Patient/caregiver will verbalize understanding of skin care regimen Date Initiated: 09/28/2022 Target Resolution Date: 05/03/2023 Goal Status: Active Ulcer/skin breakdown will have a volume reduction of 50% by week 8 Date Initiated: 09/28/2022 Date Inactivated: 11/01/2022 Target Resolution Date: 11/02/2022 Goal Status: Met Ulcer/skin breakdown will have a volume reduction of 80% by week 12 Date  Initiated: 11/01/2022 Date Inactivated: 11/29/2022 Target Resolution Date: 11/29/2022 Unmet Reason: vac in place, still Goal Status: Unmet debriding heel Interventions: Assess patient/caregiver ability to obtain necessary supplies Assess patient/caregiver ability to perform ulcer/skin care regimen upon admission and as needed Assess ulceration(s) every visit Provide education on ulcer and skin care Treatment Activities: Skin care regimen  initiated : 09/28/2022 Topical wound management initiated : 09/28/2022 Notes: Electronic Signature(s) Signed: 04/04/2023 4:36:46 PM By: Zenaida Deed RN, BSN [place], Tawny Asal ([id number]) [id number] Page 5 of 10 Entered By: Zenaida Deed on 04/04/2023 07:59:26 -------------------------------------------------------------------------------- Negative Pressure Wound Therapy Maintenance (NPWT) Details Patient Name: Date of Service: YASMEEN, EGERER 04/04/2023 10:30 A M Medical Record Number: [id number] Patient Account Number: [id number] Date of Birth/Sex: Treating RN: 06/04/1951 (72 y.o. [name] Primary Care Ryver Poblete: Saralyn Pilar Other Clinician: Referring Rema Lievanos: Treating Tao Satz/Extender: [name] Weeks in Treatment: 29 NPWT Maintenance Performed for: Wound #1 Right Gluteus Additional Injuries Covered: No Performed By: Zenaida Deed, RN Type: VAC System Coverage Size (sq cm): 12.6 Pressure Type: Constant Pressure Setting: 125 mmHG Drain Type: None Primary Contact: Other : silver collagen Sponge/Dressing Type: Foam- Black Date Initiated: 10/16/2022 Dressing Removed: Yes Quantity of Sponges/Gauze Removed: 1 Canister Changed: No Dressing Reapplied: Yes Quantity of Sponges/Gauze Inserted: 1 Respones T Treatment: o good Days On NPWT : 171 Post Procedure Diagnosis Same as Pre-procedure Electronic Signature(s) Signed: 04/04/2023 4:36:46 PM By: Zenaida Deed RN, BSN Entered By: Zenaida Deed on 04/04/2023 08:05:27 -------------------------------------------------------------------------------- Pain Assessment Details Patient Name: Date of Service: [name] E. 04/04/2023 10:30 A M Medical Record Number: [id number] Patient Account Number: [id number] Date of Birth/Sex: Treating RN: 08-Oct-1950 (72 y.o. [name] Primary Care Amando Ishikawa: Saralyn Pilar Other Clinician: Referring Rodriguez Aguinaldo: Treating  Uziel Covault/Extender: Park Liter in Treatment: 29 Active Problems Location of Pain Severity and Description of Pain Patient Has Paino Yes Site Locations Pain Location: RAISSA, SCHON ([id number]) [phone].pdf Page 6 of 10 Pain Location: Pain in Ulcers With Dressing Change: Yes Duration of the Pain. Constant / Intermittento Intermittent Rate the pain. Current Pain Level: 3 Worst Pain Level: 7 Least Pain Level: 0 Character of Pain Describe the Pain: Aching Pain Management and Medication Current Pain Management: Medication: Yes Is the Current Pain Management Adequate: Adequate How does your wound impact your activities of daily livingo Sleep: No Bathing: No Appetite: No Relationship With Others: No Bladder Continence: No Emotions: No Bowel Continence: No Work: No Toileting: No Drive: No Dressing: No Hobbies: No Psychologist, prison and probation services) Signed: 04/04/2023 4:36:46 PM By: Zenaida Deed RN, BSN Entered By: Zenaida Deed on 04/04/2023 07:39:13 -------------------------------------------------------------------------------- Patient/Caregiver Education Details Patient Name: Date of Service: [name] 8/21/2024andnbsp10:30 A M Medical Record Number: [id number] Patient Account Number: [id number] Date of Birth/Gender: Treating RN: Dec 26, 1950 (72 y.o. [name] Primary Care Physician: Saralyn Pilar Other Clinician: Referring Physician: Treating Physician/Extender: Park Liter in Treatment: 29 Education Assessment Education Provided To: Patient Education Topics Provided Pressure: Methods: Explain/Verbal Responses: Reinforcements needed, State content correctly Wound/Skin Impairment: Methods: Explain/Verbal Responses: Reinforcements needed, State content correctly Electronic Signature(s) Signed: 04/04/2023 4:36:46 PM By: Zenaida Deed RN, BSN [place], Tawny Asal ([id number])  [id number] Page 7 of 10 Entered By: Zenaida Deed on 04/04/2023 08:00:02 -------------------------------------------------------------------------------- Wound Assessment Details Patient Name: Date of Service: [name], [name] 04/04/2023 10:30 A M Medical Record Number: [id number] Patient Account Number: [id number] Date of Birth/Sex: Treating RN: 09-05-1950 (72 y.o. 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 Wound Open Wounding Event: Pressure Injury Status: Date Acquired: 08/11/2022 Comorbid Chronic sinus problems/congestion, Sleep Apnea, Congestive Heart Weeks Of Treatment: 29 History: Failure, Raynauds, Rheumatoid Arthritis, Neuropathy Clustered Wound: No Photos Wound Measurements Length: (cm) 3 Width: (cm) 4.2 Depth: (cm) 5.7 Area: (cm) 9.896 Volume: (cm) 56.407 % Reduction in Area: 55% % Reduction in Volume: 57.3% Epithelialization: Small (1-33%) Tunneling: No Undermining: No Wound Description Classification: Category/Stage IV Wound Margin: Well defined, not attached Exudate Amount: Medium Exudate Type: Serosanguineous Exudate Color: red, brown Foul Odor After Cleansing: No Slough/Fibrino Yes Wound Bed Granulation Amount: Large (67-100%) Exposed Structure Granulation Quality: Red Fascia Exposed: No Necrotic Amount: Small (1-33%) Fat Layer (Subcutaneous Tissue) Exposed: Yes Necrotic Quality: Adherent Slough Tendon Exposed: No Muscle Exposed: No Joint Exposed: No Bone Exposed: No Periwound Skin Texture Texture Color No Abnormalities Noted: No No Abnormalities Noted: Yes Callus: No Temperature / Pain Crepitus: No Temperature: No Abnormality Excoriation: No Tenderness on Palpation: Yes Induration:  No Rash: No Scarring: No [address] ALIAA, KENNEMER ([id number]) [id number] Page 8 of 10 No Abnormalities Noted: Yes Treatment Notes Wound #1 (Gluteus) Wound Laterality: Right Cleanser Wound Cleanser Discharge Instruction: Cleanse the wound with wound cleanser prior to applying a clean dressing using gauze sponges, not tissue or cotton balls. Peri-Wound Care Nystop Powder (Nystatin) Discharge Instruction: Apply Nystop (Nystatin) Powder as needed to irritated skin Topical Primary Dressing Promogran Prisma Matrix, 4.34 (sq in) (silver collagen) Discharge Instruction: Moisten collagen with saline or hydrogel Maxorb Extra Calcium Alginate, 2x2 (in/in) Discharge Instruction: Apply to weeping periwound under drape 22M Tegaderm Hydrocolloid Dressing, 4x4 (in/in) Discharge Instruction: to periwound to protect VAC Secondary Dressing Secured With Compression Wrap Compression Stockings Add-Ons Electronic Signature(s) Signed: 04/04/2023 4:36:46 PM By: Zenaida Deed RN, BSN Entered By: Zenaida Deed on 04/04/2023 07:58:02 -------------------------------------------------------------------------------- Wound Assessment Details Patient Name: Date of Service: [name]. 04/04/2023 10:30 A M Medical Record Number: [id number] Patient Account Number: [id number] Date of Birth/Sex: Treating RN: 06-21-1951 (72 y.o. [name] Primary Care Myson Levi: Saralyn Pilar Other Clinician: Referring Matalyn Nawaz: Treating Khyree Carillo/Extender: [name] Weeks in Treatment: 29 Wound Status Wound Number: 3 Primary Pressure Ulcer Etiology: Wound Location: Right Calcaneus Wound Open Wounding Event: Pressure Injury Status: Date Acquired: 09/21/2022 Comorbid Chronic sinus problems/congestion, Sleep Apnea, Congestive Heart Weeks Of Treatment: 26 History: Failure, Raynauds, Rheumatoid Arthritis, Neuropathy Clustered Wound: No Photos EMALEA, ARO  ([id number]) [phone].pdf Page 9 of 10 Wound Measurements Length: (cm) 0.6 Width: (cm) 2.5 Depth: (cm) 0.2 Area: (cm) 1.178 Volume: (cm) 0.236 % Reduction in Area: 87.8% % Reduction in Volume: 75.6% Epithelialization: Small (1-33%) Tunneling: No Undermining: No Wound Description Classification: Category/Stage IV Wound Margin: Distinct, outline attached Exudate Amount: Medium Exudate Type: Serosanguineous Exudate Color: red, brown Foul Odor After Cleansing: No Slough/Fibrino Yes Wound Bed Granulation Amount: Large (67-100%) Exposed Structure Granulation Quality: Red Fascia Exposed: No Necrotic Amount: Small (1-33%) Fat Layer (Subcutaneous Tissue) Exposed: Yes Necrotic Quality: Adherent Slough Tendon Exposed: No Muscle Exposed: No Joint Exposed: No Bone Exposed: No Periwound Skin Texture Texture Color No Abnormalities Noted: Yes No Abnormalities Noted: Yes Moisture Temperature / Pain No Abnormalities Noted: Yes Temperature: No Abnormality Tenderness on Palpation: Yes Treatment Notes Wound #3 (Calcaneus) Wound Laterality: Right Cleanser Peri-Wound Care Topical Gentamicin Discharge Instruction: mix 50:50 with mupirocin and rub into wound bed Mupirocin Ointment Discharge Instruction:  Apply Mupirocin (Bactroban) as instructed Primary Dressing Maxorb Extra Ag+ Alginate Dressing, 4x4.75 (in/in) Discharge Instruction: Apply to wound bed as instructed Secondary Dressing ALLEVYN Heel 4 1/2in x 5 1/2in / 10.5cm x 13.5cm Discharge Instruction: Apply over primary dressing as directed. Woven Gauze Sponge, Non-Sterile 4x4 in Discharge Instruction: Apply over primary dressing as directed. Secured With [employer], 4.5x3.1 (in/yd) Discharge Instruction: Secure with Kerlix as directed. Compression GAURI, CHEW ([id number]) [phone].pdf Page 10 of 10 Compression Stockings Circaid Juxta Lite Compression  Wrap Quantity: 1 Right Leg Compression Amount: 30-40 mmHg Discharge Instruction: Apply Circaid Juxta Lite Compression Wrap daily as instructed. Apply first thing in the morning, remove at night before bed. Add-Ons Electronic Signature(s) Signed: 04/04/2023 4:36:46 PM By: Zenaida Deed RN, BSN Entered By: Zenaida Deed on 04/04/2023 07:58:52 -------------------------------------------------------------------------------- Vitals Details Patient Name: Date of Service: [name] E. 04/04/2023 10:30 A M Medical Record Number: [id number] Patient Account Number: [id number] Date of Birth/Sex: Treating RN: [birth year] (72 y.o. [name] Primary Care Jaxxen Voong: Saralyn Pilar Other Clinician: Referring Jolly Carlini: Treating Hala Narula/Extender: [name] Weeks in Treatment: 29 Vital Signs Time Taken: 10:37 Temperature (F): 98.6 Height (in): 62 Pulse (bpm): 63 Weight (lbs): 207 Respiratory Rate (breaths/min): 18 Body Mass Index (BMI): 37.9 Blood Pressure (mmHg): 133/55 Reference Range: 80 - 120 mg / dl Electronic Signature(s) Signed: 04/04/2023 4:36:46 PM By: Zenaida Deed RN, BSN Entered By: Zenaida Deed on 04/04/2023 07:38:01

## 2023-04-11 ENCOUNTER — Telehealth: Payer: Self-pay | Admitting: Adult Health

## 2023-04-11 NOTE — Telephone Encounter (Signed)
Pt said, have a wound vac from a bed sore from being in rehab. Have bed sores back side, heel both wound stage 4. The doctor suggested increasing Gabapentin.  Would like to know if you can increase dosage for gabapentin until the wound vac comes out. Would like a call back.

## 2023-04-12 NOTE — Telephone Encounter (Signed)
Call to patient, she states she has 2 wound vacs from beds sores that are healing ( back and heel) and due to that she is getting feeling back and dressing changes are painful due to nerve pain. She is requesting for increase in gabapentin for increased pain. Advised I would send to [facility] for advice and recommendations. Patient appreciative of call

## 2023-04-13 DIAGNOSIS — I5022 Chronic systolic (congestive) heart failure: Secondary | ICD-10-CM | POA: Diagnosis not present

## 2023-04-13 DIAGNOSIS — R2689 Other abnormalities of gait and mobility: Secondary | ICD-10-CM | POA: Diagnosis not present

## 2023-04-13 DIAGNOSIS — S72409D Unspecified fracture of lower end of unspecified femur, subsequent encounter for closed fracture with routine healing: Secondary | ICD-10-CM | POA: Diagnosis not present

## 2023-04-13 DIAGNOSIS — M6281 Muscle weakness (generalized): Secondary | ICD-10-CM | POA: Diagnosis not present

## 2023-04-13 NOTE — Telephone Encounter (Signed)
If she is having pain from wounds/uclers. I think it would be best to discuss with her wound MD. If they want to increase gabapentin that's fine.

## 2023-04-13 NOTE — Telephone Encounter (Signed)
Call to patient, advised per Butch Penny, Np to follow up with wound care MD for pain management with wound care and dressing changes. Patient states that she had asked them and they will only prescribe antibiotics if needed but do not provide pain management. She stated that they defer to prescribing providers if medications are already in place and education was provided on different disease processes. Patient verbalized understanding and in agreement to contact PCP for advice/recommendations.  Patient appreciative of call.

## 2023-04-14 DIAGNOSIS — N1832 Chronic kidney disease, stage 3b: Secondary | ICD-10-CM | POA: Diagnosis not present

## 2023-04-14 DIAGNOSIS — L89314 Pressure ulcer of right buttock, stage 4: Secondary | ICD-10-CM | POA: Diagnosis not present

## 2023-04-14 DIAGNOSIS — R7302 Impaired glucose tolerance (oral): Secondary | ICD-10-CM | POA: Diagnosis not present

## 2023-04-14 DIAGNOSIS — L89313 Pressure ulcer of right buttock, stage 3: Secondary | ICD-10-CM | POA: Diagnosis not present

## 2023-04-14 DIAGNOSIS — I5032 Chronic diastolic (congestive) heart failure: Secondary | ICD-10-CM | POA: Diagnosis not present

## 2023-04-15 DIAGNOSIS — L89314 Pressure ulcer of right buttock, stage 4: Secondary | ICD-10-CM | POA: Diagnosis not present

## 2023-04-18 ENCOUNTER — Encounter (HOSPITAL_BASED_OUTPATIENT_CLINIC_OR_DEPARTMENT_OTHER): Payer: PPO | Attending: General Surgery | Admitting: General Surgery

## 2023-04-18 DIAGNOSIS — M069 Rheumatoid arthritis, unspecified: Secondary | ICD-10-CM | POA: Diagnosis not present

## 2023-04-18 DIAGNOSIS — E785 Hyperlipidemia, unspecified: Secondary | ICD-10-CM | POA: Insufficient documentation

## 2023-04-18 DIAGNOSIS — L89314 Pressure ulcer of right buttock, stage 4: Secondary | ICD-10-CM | POA: Diagnosis not present

## 2023-04-18 DIAGNOSIS — L89614 Pressure ulcer of right heel, stage 4: Secondary | ICD-10-CM | POA: Diagnosis not present

## 2023-04-18 DIAGNOSIS — I130 Hypertensive heart and chronic kidney disease with heart failure and stage 1 through stage 4 chronic kidney disease, or unspecified chronic kidney disease: Secondary | ICD-10-CM | POA: Insufficient documentation

## 2023-04-18 DIAGNOSIS — G629 Polyneuropathy, unspecified: Secondary | ICD-10-CM | POA: Insufficient documentation

## 2023-04-18 DIAGNOSIS — I5032 Chronic diastolic (congestive) heart failure: Secondary | ICD-10-CM | POA: Insufficient documentation

## 2023-04-18 DIAGNOSIS — N1832 Chronic kidney disease, stage 3b: Secondary | ICD-10-CM | POA: Diagnosis not present

## 2023-04-19 NOTE — Progress Notes (Signed)
[name], [name] ([id number]) [id number] Page 1 of 12 Visit Report for 04/18/2023 Chief Complaint Document Details Patient Name: Date of Service: [name], [name] 04/18/2023 12:30 PM Medical Record Number: [id number] Patient Account Number: [id number] Date of Birth/Sex: Treating RN: 07-26-1951 (72 y.o. F) Primary Care Provider: Saralyn Cooper Other Clinician: Referring Provider: Treating Provider/Extender: [name] in Treatment: 31 Information Obtained from: Patient Chief Complaint Patient is at the clinic for treatment of open pressure ulcers Electronic Signature(s) Signed: 04/18/2023 1:32:06 PM By: Marisa Guess MD FACS Entered By: [name] on 04/18/2023 10:32:06 -------------------------------------------------------------------------------- Debridement Details Patient Name: Date of Service: [name] 04/18/2023 12:30 PM Medical Record Number: [id number] Patient Account Number: [id number] Date of Birth/Sex: Treating RN: [birth year] (72 y.o. [name] Primary Care Provider: Saralyn Cooper Other Clinician: Referring Provider: Treating Provider/Extender: [name] in Treatment: 31 Debridement Performed for Assessment: Wound #3 Right Calcaneus Performed By: Physician Marisa Guess, MD Debridement Type: Debridement Level of Consciousness (Pre-procedure): Awake and Alert Pre-procedure Verification/Time Out Yes - 13:20 Taken: Start Time: 13:22 Pain Control: Lidocaine 4% T opical Solution Percent of Wound Bed Debrided: 200% T Area Debrided (cm): otal 0.19 Tissue and other material debrided: Non-Viable, Slough, Skin: Epidermis, Slough Level: Skin/Epidermis Debridement Description: Selective/Open Wound Instrument: Curette Bleeding: Minimum Hemostasis Achieved: Pressure Procedural Pain: 0 Post Procedural Pain: 0 Response to Treatment: Procedure was tolerated well Level  of Consciousness (Post- Awake and Alert procedure): Post Debridement Measurements of Total Wound Length: (cm) 0.2 Stage: Category/Stage IV Width: (cm) 0.6 Depth: (cm) 0.1 Volume: (cm) 0.009 Character of Wound/Ulcer Post Debridement: Improved Post Procedure Diagnosis [name], [name] ([id number]) [id number] Page 2 of 12 Same as Pre-procedure Notes Scribed for Dr [name] by Marisa Deed, RN Electronic Signature(s) Signed: 04/18/2023 1:37:38 PM By: Marisa Guess MD FACS Signed: 04/18/2023 5:38:06 PM By: Marisa Deed RN, BSN Entered By: [name] on 04/18/2023 10:25:37 -------------------------------------------------------------------------------- HPI Details Patient Name: Date of Service: [name], [name] 04/18/2023 12:30 PM Medical Record Number: [id number] Patient Account Number: [id number] Date of Birth/Sex: Treating RN: 07-Nov-1950 (72 y.o. F) Primary Care Provider: Saralyn Cooper Other Clinician: Referring Provider: Treating Provider/Extender: [name] in Treatment: 31 History of Present Illness HPI Description: ADMISSION 09/07/2023 This is a 72 year old woman with a past medical history notable for obesity, congestive heart failure, Raynaud's syndrome, CKD stage IIIb, osteoporosis, and rheumatoid arthritis. In November 2023, she suffered a fall that resulted in a femur fracture. She was hospitalized for about a week and underwent surgical repair of the fracture. She subsequently developed pressure ulcers on her right buttocks and ischium. She has been receiving home health services and they have been applying Medihoney. They have been reporting the wounds as stage II, but on evaluation, the large ulcer was probably unstageable at the time they were evaluating it but it is clearly a stage IV at this point. The smaller ulcer does have fat layer exposure and therefore is a stage III. The patient is accompanied by her  daughter. She says she has been sleeping on a regular bed but recently ordered an air mattress T opper, but does not have it yet. She is on Ozempic for weight loss and therefore has a poor appetite and struggles to get adequate protein intake. 09/15/2022: The large stage IV ulcer is substantially cleaner this week. The stage III ulcer is a little smaller. Both have slough accumulation. The culture that I took last week was polymicrobial. The Augmentin  that I prescribed was adequate coverage for the species and she continues to take this. We have also ordered Keystone topical antibiotic compound, but this has not yet arrived. 09/21/2022: The stage IV ulcer has some necrotic muscle at the base but is otherwise fairly clean. The stage III ulcer is smaller again with light slough on the surface. She has her Keystone topical antibiotic compound with her today. 09/29/2022: There is still some necrotic muscle at the base of the stage IV ulcer that I was unable to get to last week. The stage III ulcer has healed. She unfortunately has developed a new pressure ulcer on her right heel. 10/06/2022: Still with some devitalized muscle at the base of the stage IV ulcer, but otherwise this wound is looking quite clean. The pressure induced tissue injury on her heel is demarcating and much of the area appears to be epithelialized, but there is still 2 areas that remain questionable. 10/12/2022: The stage IV ulcer is very clean and ready for wound VAC, which will be delivered tomorrow according to the patient. The tissue injury on her heel is drying up and does not feel particularly boggy this week. 10/19/2022: The heel injury continues to improve. There is still some dry eschar present on the more plantar aspect of it. There is some slough accumulation on the surface of the stage IV pressure ulcer. The wound VAC was initiated by home health last week. 11/01/2022: The gluteal pressure ulcer is very clean without any necrotic  tissue or slough. The depth has come in by over half a centimeter. The deep tissue injury on her heel continues to improve. There is still dry eschar that we are painting with Betadine as well as some fresh-looking viable tissue at the margin. 11/15/2022: The gluteal pressure ulcer is clean and contracting. The eschar on her heel ulcer is beginning to separate from the underlying tissue. 11/29/2022: The gluteal pressure ulcer is very clean. The depth has come in by about a centimeter. The eschar has completely separated off of her heel. There is some fibrinous exudate and slough on the heel. It has declared itself as a stage III at this point. 12/13/2022: The heel ulcer is smaller and much cleaner, but still has some non-viable tissue present. The orifice of the gluteal ulcer is contracting, but the depth remains the same. Fortunately, the sponge for the [facility] is being packed appropriately into the full depth of the wound. 12/27/2022: The heel ulcer is smaller, but I think the depth and degree of tissue injury has finally declared itself, with tendon being exposed at the posterior aspect of the ulcer. It is now stage IV. The gluteal ulcer continues to contract circumferentially, but the depth is still unchanged. 01/10/2023: The heel ulcer has contracted further. There is still rubbery slough on the surface. There has been no further tissue breakdown; I think the last of the nonviable tissue was removed at her visit 2 weeks ago. The gluteal ulcer has contracted further circumferentially, but there is still a tunnel that ankles off cranially that remains about the same depth. The tissues appear viable at both sites. 01/24/2023: Unfortunately, the home health nurse that was applying the wound VAC did not put any drape on her skin for the bridge and was applying sponge directly to the patient's skin. This is resulted in significant tissue breakdown and pain for the patient. Once we were notified of this, we  discontinued the wound VAC and they have been packing the wound with saline  moistened gauze. The heel has also deteriorated. Bone is now exposed. 01/30/2023: Her skin looks significantly better this week. It has recovered from the insult caused by applying the sponge directly to it. Her heel looks a little bit better as well. The culture that I took last week grew out group A strep, Proteus mirabilis, and Enterococcus, along with other skin flora. Although Levaquin was the recommended antibiotic, she is on amiodarone and therefore Levaquin is contraindicated. She has been taking Augmentin and is showing signs of JAIDALYNN, ACCURSO ([id number]) [id number] Page 3 of 12 improvement. 02/07/2023: The insult to her skin caused by direct application of wound VAC sponge has nearly completely healed. The gluteal ulcer cavity continues to contract circumferentially, but still probes quite deeply; I suspect the sponge for her VAC is not getting placed completely down into that tunnel. The heel is looking better today. There is more granulation filling in. I did not feel bone today. 02/14/2023: The gluteal ulcer cavity has circumferentially contracted even further, although the depth remains the same. The heel has improved and there is good tissue overlying where the bone had been exposed. Both wounds have some slough on the surface. 02/21/2023: The gluteal ulcer cavity continues to contract and is about half a centimeter shallower this week. The surface is clean without any slough or other debris accumulation. The heel continues to fill in granulation tissue over where the bone had been exposed. There is some slough on the wound surface. 03/07/2023: The gluteal ulcer was measured slightly deeper today, but on palpation, it feels roughly the same. The cavity is certainly more contracted. She was complaining of more pain in the area and it appears that GranuFoam was applied directly to her skin  and she has some irritation from this. The heel ulcer is filling in with excellent granulation tissue. It does not probe to bone any longer. Minimal slough accumulation. 03/21/2023: The gluteal ulcer is shallower and the cavity is tighter. She has some periwound skin breakdown that we have been trying to address by applying DuoDERM to the skin around the wound opening. The home health nurse also added nystatin powder to address some yeasty-looking areas. The heel ulcer is much shallower and smaller. Minimal slough and biofilm buildup. 04/04/2023: The gluteal ulcer continues to get shallower and narrower. The periwound skin looks much better this week. The heel ulcer has a band of epithelialized tissue separating the site into 2 openings. There is very little depth remaining and the wounds are quite clean with minimal slough on the surface. 04/18/2023: The heel ulcer is nearly completely closed. There is just 1 open very superficial area remaining underneath some eschar and slough. The gluteal ulcer still has the same depth at its deepest point, but everything is much narrower and constricted. The wound is clean without any slough. Electronic Signature(s) Signed: 04/18/2023 1:33:07 PM By: Marisa Guess MD FACS Entered By: [name] on 04/18/2023 10:33:07 -------------------------------------------------------------------------------- Physical Exam Details Patient Name: Date of Service: [name], [name] 04/18/2023 12:30 PM Medical Record Number: [id number] Patient Account Number: [id number] Date of Birth/Sex: Treating RN: 1951/05/30 (72 y.o. F) Primary Care Provider: Saralyn Cooper Other Clinician: Referring Provider: Treating Provider/Extender: [name] in Treatment: 31 Constitutional Hypertensive, asymptomatic. . . . no acute distress. Respiratory Normal work of breathing on room air. Notes 04/18/2023: The heel ulcer is nearly completely closed. There is just  1 open very superficial area remaining underneath some eschar and slough. The gluteal ulcer still  has the same depth at its deepest point, but everything is much narrower and constricted. The wound is clean without any slough. Electronic Signature(s) Signed: 04/18/2023 1:34:00 PM By: Marisa Guess MD FACS Entered By: [name] on 04/18/2023 10:34:00 -------------------------------------------------------------------------------- Physician Orders Details Patient Name: Date of Service: [name], [name] 04/18/2023 12:30 PM Medical Record Number: [id number] Patient Account Number: [id number] Date of Birth/Sex: Treating RN: 09/10/1950 (72 y.o. [name] Primary Care Provider: Saralyn Cooper Other Clinician: Referring Provider: Treating Provider/Extender: [name] in Treatment: [address] / Phone Orders: No [name], [name] ([id number]) [id number] Page 4 of 12 Diagnosis Coding ICD-10 Coding Code Description L89.314 Pressure ulcer of right buttock, stage 4 L89.614 Pressure ulcer of right heel, stage 4 I73.00 Raynaud's syndrome without gangrene I50.32 Chronic diastolic (congestive) heart failure N18.32 Chronic kidney disease, stage 3b R73.02 Impaired glucose tolerance (oral) E66.9 Obesity, unspecified Follow-up Appointments ppointment in 2 weeks. - Dr [name] Rm 1 Return A Wed 9/18 @ 12:30 pm Anesthetic Wound #1 Right Gluteus (In clinic) Topical Lidocaine 4% applied to wound bed Bathing/ Shower/ Hygiene May shower and wash wound with soap and water. Negative Presssure Wound Therapy Wound #1 Right Gluteus Wound Vac to wound continuously at 114mm/hg pressure Black Foam Off-Loading Heel suspension boot to: - globoped right foot to ambulate Low air-loss mattress (Group 2) - Adapt Turn and reposition every 2 hours - avoid lying on back, stand at least every hour while out of bed, float heels off bed with pillows  under calves while in bed Prevalon Boot - right foot esp. while in bed. Additional Orders / Instructions Follow Nutritious Diet - increase protein to 70-80 gms per day, hold ozempic Juven Shake 1-2 times daily. Home Health No change in wound care orders this week; continue Home Health for wound care. May utilize formulary equivalent dressing for wound treatment orders unless otherwise specified. Other Home Health Orders/Instructions: - [facility] Wound Treatment Wound #1 - Gluteus Wound Laterality: Right Cleanser: Wound Cleanser 3 x Per Week/30 Days Discharge Instructions: Cleanse the wound with wound cleanser prior to applying a clean dressing using gauze sponges, not tissue or cotton balls. Peri-Wound Care: Nystop Powder (Nystatin) 3 x Per Week/30 Days Discharge Instructions: Apply Nystop (Nystatin) Powder as needed to irritated skin Prim Dressing: Promogran Prisma Matrix, 4.34 (sq in) (silver collagen) 3 x Per Week/30 Days ary Discharge Instructions: Moisten collagen with saline or hydrogel Prim Dressing: Maxorb Extra Calcium Alginate, 2x2 (in/in) 3 x Per Week/30 Days ary Discharge Instructions: Apply to weeping periwound under drape as needed for maceration Prim Dressing: VAC ary 3 x Per Week/30 Days Wound #3 - Calcaneus Wound Laterality: Right Topical: Gentamicin 3 x Per Day/30 Days Discharge Instructions: mix 50:50 with mupirocin and rub into wound bed Topical: Mupirocin Ointment 3 x Per Day/30 Days Discharge Instructions: Apply Mupirocin (Bactroban) as instructed Prim Dressing: Maxorb Extra Ag+ Alginate Dressing, 4x4.75 (in/in) 3 x Per Day/30 Days ary Discharge Instructions: Apply to wound bed as instructed Secondary Dressing: ALLEVYN Heel 4 1/2in x 5 1/2in / 10.5cm x 13.5cm 3 x Per Day/30 Days Discharge Instructions: Apply over primary dressing as directed. Secondary Dressing: Woven Gauze Sponge, Non-Sterile 4x4 in 3 x Per Day/30 Days [name], [name] ([id number])  [id number] Page 5 of 12 Discharge Instructions: Apply over primary dressing as directed. Secured With: [employer], 4.5x3.1 (in/yd) 3 x Per Day/30 Days Discharge Instructions: Secure with Kerlix as directed. Compression Stockings: Circaid Juxta Lite Compression Wrap Right Leg Compression  Amount: 30-40 mmHG Discharge Instructions: Apply Circaid Juxta Lite Compression Wrap daily as instructed. Apply first thing in the morning, remove at night before bed. Electronic Signature(s) Signed: 04/18/2023 1:37:38 PM By: Marisa Guess MD FACS Entered By: [name] on 04/18/2023 10:35:34 -------------------------------------------------------------------------------- Problem List Details Patient Name: Date of Service: [name] 04/18/2023 12:30 PM Medical Record Number: [id number] Patient Account Number: [id number] Date of Birth/Sex: Treating RN: 1950/10/11 (72 y.o. [name] Primary Care Provider: Saralyn Cooper Other Clinician: Referring Provider: Treating Provider/Extender: [name] Weeks in Treatment: 31 Active Problems ICD-10 Encounter Code Description Active Date MDM Diagnosis L89.314 Pressure ulcer of right buttock, stage 4 09/07/2022 No Yes L89.614 Pressure ulcer of right heel, stage 4 09/28/2022 No Yes I73.00 Raynaud's syndrome without gangrene 09/07/2022 No Yes I50.32 Chronic diastolic (congestive) heart failure 09/07/2022 No Yes N18.32 Chronic kidney disease, stage 3b 09/07/2022 No Yes R73.02 Impaired glucose tolerance (oral) 09/07/2022 No Yes E66.9 Obesity, unspecified 09/07/2022 No Yes Inactive Problems ICD-10 Code Description Active Date Inactive Date L89.313 Pressure ulcer of right buttock, stage 3 09/07/2022 09/07/2022 [name], [name] ([id number]) [id number] Page 6 of 12 Resolved Problems Electronic Signature(s) Signed: 04/18/2023 1:31:43 PM By: Marisa Guess MD  FACS Entered By: [name] on 04/18/2023 10:31:43 -------------------------------------------------------------------------------- Progress Note Details Patient Name: Date of Service: Marisa Franne Grip. 04/18/2023 12:30 PM Medical Record Number: [id number] Patient Account Number: [id number] Date of Birth/Sex: Treating RN: April 13, 1951 (72 y.o. F) Primary Care Provider: Saralyn Cooper Other Clinician: Referring Provider: Treating Provider/Extender: [name] in Treatment: 31 Subjective Chief Complaint Information obtained from Patient Patient is at the clinic for treatment of open pressure ulcers History of Present Illness (HPI) ADMISSION 09/07/2023 This is a 72 year old woman with a past medical history notable for obesity, congestive heart failure, Raynaud's syndrome, CKD stage IIIb, osteoporosis, and rheumatoid arthritis. In November 2023, she suffered a fall that resulted in a femur fracture. She was hospitalized for about a week and underwent surgical repair of the fracture. She subsequently developed pressure ulcers on her right buttocks and ischium. She has been receiving home health services and they have been applying Medihoney. They have been reporting the wounds as stage II, but on evaluation, the large ulcer was probably unstageable at the time they were evaluating it but it is clearly a stage IV at this point. The smaller ulcer does have fat layer exposure and therefore is a stage III. The patient is accompanied by her daughter. She says she has been sleeping on a regular bed but recently ordered an air mattress T opper, but does not have it yet. She is on Ozempic for weight loss and therefore has a poor appetite and struggles to get adequate protein intake. 09/15/2022: The large stage IV ulcer is substantially cleaner this week. The stage III ulcer is a little smaller. Both have slough accumulation. The culture that I took last week was  polymicrobial. The Augmentin that I prescribed was adequate coverage for the species and she continues to take this. We have also ordered Keystone topical antibiotic compound, but this has not yet arrived. 09/21/2022: The stage IV ulcer has some necrotic muscle at the base but is otherwise fairly clean. The stage III ulcer is smaller again with light slough on the surface. She has her Keystone topical antibiotic compound with her today. 09/29/2022: There is still some necrotic muscle at the base of the stage IV ulcer that I was unable to get to last week. The  stage III ulcer has healed. She unfortunately has developed a new pressure ulcer on her right heel. 10/06/2022: Still with some devitalized muscle at the base of the stage IV ulcer, but otherwise this wound is looking quite clean. The pressure induced tissue injury on her heel is demarcating and much of the area appears to be epithelialized, but there is still 2 areas that remain questionable. 10/12/2022: The stage IV ulcer is very clean and ready for wound VAC, which will be delivered tomorrow according to the patient. The tissue injury on her heel is drying up and does not feel particularly boggy this week. 10/19/2022: The heel injury continues to improve. There is still some dry eschar present on the more plantar aspect of it. There is some slough accumulation on the surface of the stage IV pressure ulcer. The wound VAC was initiated by home health last week. 11/01/2022: The gluteal pressure ulcer is very clean without any necrotic tissue or slough. The depth has come in by over half a centimeter. The deep tissue injury on her heel continues to improve. There is still dry eschar that we are painting with Betadine as well as some fresh-looking viable tissue at the margin. 11/15/2022: The gluteal pressure ulcer is clean and contracting. The eschar on her heel ulcer is beginning to separate from the underlying tissue. 11/29/2022: The gluteal pressure ulcer  is very clean. The depth has come in by about a centimeter. The eschar has completely separated off of her heel. There is some fibrinous exudate and slough on the heel. It has declared itself as a stage III at this point. 12/13/2022: The heel ulcer is smaller and much cleaner, but still has some non-viable tissue present. The orifice of the gluteal ulcer is contracting, but the depth remains the same. Fortunately, the sponge for the [facility] is being packed appropriately into the full depth of the wound. 12/27/2022: The heel ulcer is smaller, but I think the depth and degree of tissue injury has finally declared itself, with tendon being exposed at the posterior aspect of the ulcer. It is now stage IV. The gluteal ulcer continues to contract circumferentially, but the depth is still unchanged. 01/10/2023: The heel ulcer has contracted further. There is still rubbery slough on the surface. There has been no further tissue breakdown; I think the last of the nonviable tissue was removed at her visit 2 weeks ago. The gluteal ulcer has contracted further circumferentially, but there is still a tunnel that ankles off cranially that remains about the same depth. The tissues appear viable at both sites. 01/24/2023: Unfortunately, the home health nurse that was applying the wound VAC did not put any drape on her skin for the bridge and was applying sponge directly to the patient's skin. This is resulted in significant tissue breakdown and pain for the patient. Once we were notified of this, we discontinued the wound VAC and they have been packing the wound with saline moistened gauze. The heel has also deteriorated. Bone is now exposed. 01/30/2023: Her skin looks significantly better this week. It has recovered from the insult caused by applying the sponge directly to it. Her heel looks a little bit better as well. The culture that I took last week grew out group A strep, Proteus mirabilis, and Enterococcus, along with  other skin flora. Although Levaquin was the recommended antibiotic, she is on amiodarone and therefore Levaquin is contraindicated. She has been taking Augmentin and is showing signs of [name], [name] ([id number]) [id number] Page  7 of 12 improvement. 02/07/2023: The insult to her skin caused by direct application of wound VAC sponge has nearly completely healed. The gluteal ulcer cavity continues to contract circumferentially, but still probes quite deeply; I suspect the sponge for her VAC is not getting placed completely down into that tunnel. The heel is looking better today. There is more granulation filling in. I did not feel bone today. 02/14/2023: The gluteal ulcer cavity has circumferentially contracted even further, although the depth remains the same. The heel has improved and there is good tissue overlying where the bone had been exposed. Both wounds have some slough on the surface. 02/21/2023: The gluteal ulcer cavity continues to contract and is about half a centimeter shallower this week. The surface is clean without any slough or other debris accumulation. The heel continues to fill in granulation tissue over where the bone had been exposed. There is some slough on the wound surface. 03/07/2023: The gluteal ulcer was measured slightly deeper today, but on palpation, it feels roughly the same. The cavity is certainly more contracted. She was complaining of more pain in the area and it appears that GranuFoam was applied directly to her skin and she has some irritation from this. The heel ulcer is filling in with excellent granulation tissue. It does not probe to bone any longer. Minimal slough accumulation. 03/21/2023: The gluteal ulcer is shallower and the cavity is tighter. She has some periwound skin breakdown that we have been trying to address by applying DuoDERM to the skin around the wound opening. The home health nurse also added nystatin powder to address some  yeasty-looking areas. The heel ulcer is much shallower and smaller. Minimal slough and biofilm buildup. 04/04/2023: The gluteal ulcer continues to get shallower and narrower. The periwound skin looks much better this week. The heel ulcer has a band of epithelialized tissue separating the site into 2 openings. There is very little depth remaining and the wounds are quite clean with minimal slough on the surface. 04/18/2023: The heel ulcer is nearly completely closed. There is just 1 open very superficial area remaining underneath some eschar and slough. The gluteal ulcer still has the same depth at its deepest point, but everything is much narrower and constricted. The wound is clean without any slough. Patient History Information obtained from Patient, Caregiver, Chart. Family History Cancer - Father,Paternal Grandparents, Heart Disease - Mother,Father,Paternal Grandparents, Hypertension - Mother, Stroke - Maternal Grandparents, No family history of Diabetes, Hereditary Spherocytosis, Kidney Disease, Lung Disease, Seizures, Thyroid Problems, Tuberculosis. 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 Medical History Eyes Denies history of Cataracts, Glaucoma, Optic Neuritis Ear/Nose/Mouth/Throat Patient has history of Chronic sinus problems/congestion - chronic rhinitis Denies history of Middle ear problems Respiratory Patient has history of Sleep Apnea - uses CPAP Cardiovascular Patient has history of Congestive Heart Failure Endocrine Denies history of Type I Diabetes, Type II Diabetes Genitourinary Denies history of End Stage Renal Disease Immunological Patient has history of Raynauds Denies history of Lupus Erythematosus, Scleroderma Integumentary (Skin) Denies history of History of Burn Musculoskeletal Patient has history of Rheumatoid Arthritis Denies history of Gout, Osteoarthritis,  Osteomyelitis Neurologic Patient has history of Neuropathy Denies history of Dementia, Quadriplegia, Paraplegia, Seizure Disorder Oncologic Denies history of Received Chemotherapy, Received Radiation Psychiatric Denies history of Anorexia/bulimia, Confinement Anxiety Hospitalization/Surgery History - right femur fx ORIR. - right knee replacement. - left breast mass excision. -  left ankle tibia repair. - abdominal hysterectomy. - adnoidectomy/tonsillectomy. - cholecystectomy. - dental implants. - patoid cystectomy. - tubal ligation. Medical A Surgical History Notes nd Constitutional Symptoms (General Health) morbid obesity Cardiovascular hyperlipidemia Gastrointestinal GERD, IBS, eosinophilic esophagitis, diverticulosis, Genitourinary CKD stage 3 Immunological Sjoegren syndrome, fibromyalgia [name], AUCLAIR ([id number]) [id number] Page 8 of 12 Objective Constitutional Hypertensive, asymptomatic. no acute distress. Vitals Time Taken: 12:58 PM, Height: 62 in, Weight: 207 lbs, BMI: 37.9, Temperature: 98.6 F, Pulse: 69 bpm, Respiratory Rate: 20 breaths/min, Blood Pressure: 154/68 mmHg. Respiratory Normal work of breathing on room air. General Notes: 04/18/2023: The heel ulcer is nearly completely closed. There is just 1 open very superficial area remaining underneath some eschar and slough. The gluteal ulcer still has the same depth at its deepest point, but everything is much narrower and constricted. The wound is clean without any slough. Integumentary (Hair, Skin) Wound #1 status is Open. Original cause of wound was Pressure Injury. The date acquired was: 08/11/2022. The wound has been in treatment 31 weeks. The wound is located on the Right Gluteus. The wound measures 2.5cm length x 3.5cm width x 6.1cm depth; 6.872cm^2 area and 41.921cm^3 volume. There is Fat Layer (Subcutaneous Tissue) exposed. There is no tunneling or undermining noted. There is a medium  amount of serosanguineous drainage noted. The wound margin is well defined and not attached to the wound base. There is large (67-100%) red granulation within the wound bed. There is no necrotic tissue within the wound bed. The periwound skin appearance had no abnormalities noted for moisture. The periwound skin appearance had no abnormalities noted for color. The periwound skin appearance did not exhibit: Callus, Crepitus, Excoriation, Induration, Rash, Scarring. Periwound temperature was noted as No Abnormality. The periwound has tenderness on palpation. Wound #3 status is Open. Original cause of wound was Pressure Injury. The date acquired was: 09/21/2022. The wound has been in treatment 28 weeks. The wound is located on the Right Calcaneus. The wound measures 0.2cm length x 0.6cm width x 0.1cm depth; 0.094cm^2 area and 0.009cm^3 volume. There is Fat Layer (Subcutaneous Tissue) exposed. There is no tunneling or undermining noted. There is a small amount of serosanguineous drainage noted. The wound margin is distinct with the outline attached to the wound base. There is large (67-100%) red granulation within the wound bed. There is a small (1-33%) amount of necrotic tissue within the wound bed including Adherent Slough. The periwound skin appearance had no abnormalities noted for texture. The periwound skin appearance had no abnormalities noted for moisture. The periwound skin appearance had no abnormalities noted for color. Periwound temperature was noted as No Abnormality. The periwound has tenderness on palpation. 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 There was a Selective/Open Wound Skin/Epidermis Debridement with a total area of 0.19 sq cm performed by Marisa Guess, MD. With the following instrument(s): Curette to remove Non-Viable tissue/material. Material removed includes [facility] and Skin: Epidermis and after achieving pain control using Lidocaine 4% T opical Solution. No specimens were taken. A time out was conducted at 13:20, prior to the start of the procedure. A Minimum amount of bleeding was controlled with Pressure. The procedure was tolerated well with a pain level of 0 throughout  and a pain level of 0 following the procedure. Post Debridement Measurements: 0.2cm length x 0.6cm width x 0.1cm depth; 0.009cm^3 volume. Post debridement Stage noted as Category/Stage IV. Character of Wound/Ulcer Post Debridement is improved. Post procedure Diagnosis Wound #3: Same as Pre-Procedure General Notes: Scribed for Dr [name] by Marisa Deed, RN. Plan Follow-up Appointments: Return Appointment in 2 weeks. - Dr [name] Rm 1 Wed 9/18 @ 12:30 pm Anesthetic: Wound #1 Right Gluteus: (In clinic) Topical Lidocaine 4% applied to wound bed Bathing/ Shower/ Hygiene: May shower and wash wound with soap and water. Negative Presssure Wound Therapy: Wound #1 Right Gluteus: Wound Vac to wound continuously at 12mm/hg pressure Black Foam Off-Loading: Heel suspension boot to: - globoped right foot to ambulate Low air-loss mattress (Group 2) - [address] [name], [name] ([id number]) [id number] Page 9 of 12 Turn and reposition every 2 hours - avoid lying on back, stand at least every hour while out of bed, float heels off bed with pillows under calves while in bed Prevalon Boot - right foot esp. while in bed. Additional Orders / Instructions: Follow Nutritious Diet - increase protein to 70-80 gms per day, hold ozempic Juven Shake 1-2 times daily. Home Health: No change in wound care orders this week; continue Home  Health for wound care. May utilize formulary equivalent dressing for wound treatment orders unless otherwise specified. Other Home Health Orders/Instructions: - [facility] WOUND #1: - Gluteus Wound Laterality: Right Cleanser: Wound Cleanser 3 x Per Week/30 Days Discharge Instructions: Cleanse the wound with wound cleanser prior to applying a clean dressing using gauze sponges, not tissue or cotton balls. Peri-Wound Care: Nystop Powder (Nystatin) 3 x Per Week/30 Days Discharge Instructions: Apply Nystop (Nystatin) Powder as needed to irritated skin Prim Dressing: Promogran Prisma Matrix, 4.34 (sq in) (silver collagen) 3 x Per Week/30 Days ary Discharge Instructions: Moisten collagen with saline or hydrogel Prim Dressing: Maxorb Extra Calcium Alginate, 2x2 (in/in) 3 x Per Week/30 Days ary Discharge Instructions: Apply to weeping periwound under drape as needed for maceration Prim Dressing: VAC 3 x Per Week/30 Days ary WOUND #3: - Calcaneus Wound Laterality: Right Topical: Gentamicin 3 x Per Day/30 Days Discharge Instructions: mix 50:50 with mupirocin and rub into wound bed Topical: Mupirocin Ointment 3 x Per Day/30 Days Discharge Instructions: Apply Mupirocin (Bactroban) as instructed Prim Dressing: Maxorb Extra Ag+ Alginate Dressing, 4x4.75 (in/in) 3 x Per Day/30 Days ary Discharge Instructions: Apply to wound bed as instructed Secondary Dressing: ALLEVYN Heel 4 1/2in x 5 1/2in / 10.5cm x 13.5cm 3 x Per Day/30 Days Discharge Instructions: Apply over primary dressing as directed. Secondary Dressing: Woven Gauze Sponge, Non-Sterile 4x4 in 3 x Per Day/30 Days Discharge Instructions: Apply over primary dressing as directed. Secured With: [employer], 4.5x3.1 (in/yd) 3 x Per Day/30 Days Discharge Instructions: Secure with Kerlix as directed. Com pression Stockings: Circaid Juxta Lite Compression Wrap Compression Amount: 30-40 mmHg (right) Discharge Instructions: Apply Circaid Juxta  Lite Compression Wrap daily as instructed. Apply first thing in the morning, remove at night before bed. 04/18/2023: The heel ulcer is nearly completely closed. There is just 1 open very superficial area remaining underneath some eschar and slough. The gluteal ulcer still has the same depth at its deepest point, but everything is much narrower and constricted. The wound is clean without any slough. I used a curette to debride eschar and slough from the heel ulcer. We will continue topical gentamicin and mupirocin with silver alginate and a heel cup. The gluteal ulcer  did not require any debridement. Will continue to put Prisma silver collagen at the base of the wound and continue negative pressure wound therapy. She will follow-up in 2 weeks. Electronic Signature(s) Signed: 04/18/2023 1:36:18 PM By: Marisa Guess MD FACS Entered By: [name] on 04/18/2023 10:36:18 -------------------------------------------------------------------------------- HxROS Details Patient Name: Date of Service: Marisa Kathie Rhodes E. 04/18/2023 12:30 PM Medical Record Number: [id number] Patient Account Number: [id number] Date of Birth/Sex: Treating RN: [birth year] (72 y.o. F) Primary Care Provider: Saralyn Cooper Other Clinician: Referring Provider: Treating Provider/Extender: [name] in Treatment: 31 Information Obtained From Patient Caregiver Chart Constitutional Symptoms (General Health) Medical History: Past Medical History Notes: morbid obesity Eyes Medical History: Negative for: Cataracts; Glaucoma; Optic Neuritis NICKKI, MONSIVAIS ([id number]) [id number] Page 10 of 12 Ear/Nose/Mouth/Throat Medical History: Positive for: Chronic sinus problems/congestion - chronic rhinitis Negative for: Middle ear problems Respiratory Medical History: Positive for: Sleep Apnea - uses CPAP Cardiovascular Medical History: Positive for: Congestive Heart  Failure Past Medical History Notes: hyperlipidemia Gastrointestinal Medical History: Past Medical History Notes: GERD, IBS, eosinophilic esophagitis, diverticulosis, Endocrine Medical History: Negative for: Type I Diabetes; Type II Diabetes Genitourinary Medical History: Negative for: End Stage Renal Disease Past Medical History Notes: CKD stage 3 Immunological Medical History: Positive for: Raynauds Negative for: Lupus Erythematosus; Scleroderma Past Medical History Notes: Sjoegren syndrome, fibromyalgia Integumentary (Skin) Medical History: Negative for: History of Burn Musculoskeletal Medical History: Positive for: Rheumatoid Arthritis Negative for: Gout; Osteoarthritis; Osteomyelitis Neurologic Medical History: Positive for: Neuropathy Negative for: Dementia; Quadriplegia; Paraplegia; Seizure Disorder Oncologic Medical History: Negative for: Received Chemotherapy; Received Radiation Psychiatric Medical History: Negative for: Anorexia/bulimia; Confinement Anxiety HBO Extended History Items Ear/Nose/Mouth/Throat: Chronic sinus problems/congestion Immunizations Pneumococcal Vaccine: Received Pneumococcal VaccinationKYNLIE, HYUN ([id number]) [id number] Page 11 of 12 Received Pneumococcal Vaccination On or After 60th Birthday: Yes Implantable Devices None Hospitalization / Surgery History Type of Hospitalization/Surgery right femur fx ORIR right knee replacement left breast mass excision left ankle tibia repair abdominal hysterectomy adnoidectomy/tonsillectomy cholecystectomy dental implants patoid cystectomy tubal ligation Family and Social History Cancer: Yes - Father,Paternal Grandparents; Diabetes: No; Heart Disease: Yes - Mother,Father,Paternal Grandparents; Hereditary Spherocytosis: No; Hypertension: Yes - Mother; Kidney Disease: No; Lung Disease: No; Seizures: No; Stroke: Yes - Maternal Grandparents; Thyroid  Problems: No; Tuberculosis: No; Never smoker; Marital Status - Widowed; Alcohol Use: Rarely; Drug Use: Prior History - TCH; Caffeine Use: Daily - soda, tea; Financial Concerns: No; Food, Clothing or Shelter Needs: No; Support System Lacking: No; Transportation Concerns: No Electronic Signature(s) Signed: 04/18/2023 1:37:38 PM By: Marisa Guess MD FACS Entered By: [name] on 04/18/2023 10:33:15 -------------------------------------------------------------------------------- SuperBill Details Patient Name: Date of Service: [name] 04/18/2023 Medical Record Number: [id number] Patient Account Number: [id number] Date of Birth/Sex: Treating RN: 05/03/1951 (72 y.o. F) Primary Care Provider: Saralyn Cooper Other Clinician: Referring Provider: Treating Provider/Extender: [name] in Treatment: 31 Diagnosis Coding ICD-10 Codes Code Description L89.314 Pressure ulcer of right buttock, stage 4 L89.614 Pressure ulcer of right heel, stage 4 I73.00 Raynaud's syndrome without gangrene I50.32 Chronic diastolic (congestive) heart failure N18.32 Chronic kidney disease, stage 3b R73.02 Impaired glucose tolerance (oral) E66.9 Obesity, unspecified Facility Procedures : CPT4 Code: 62952841 Description: 97597 - DEBRIDE WOUND 1ST 20 SQ CM OR < ICD-10 Diagnosis Description L89.614 Pressure ulcer of right heel, stage 4 Modifier: Quantity: 1 : CPT4 Code: 32440102 Description: 72536 - WOUND VAC-50 SQ CM OR LESS Modifier: Quantity: 1 Physician Procedures : CPT4 Code Description Modifier [id number]  [id number] - WC PHYS LEVEL 4 - EST PT 25 ICD-10 Diagnosis Description L89.314 Pressure ulcer of right buttock, stage 4 JULIAMARIE, PLACHY ([id number]) [id number] L89.314 Pressure ulcer of right  buttock, stage 4 L89.614 Pressure ulcer of right heel, stage 4 I73.00 Raynaud's syndrome without gangrene I50.32 Chronic diastolic (congestive) heart  failure Quantity: 1 f Page 12 of 12 : [id number] 97597 - WC PHYS DEBR WO ANESTH 20 SQ CM 1 ICD-10 Diagnosis Description L89.614 Pressure ulcer of right heel, stage 4 Quantity: Electronic Signature(s) Signed: 04/18/2023 1:36:42 PM By: Marisa Guess MD FACS Entered By: [name] on 04/18/2023 10:36:41

## 2023-04-19 NOTE — Progress Notes (Signed)
[name] ([id number]) [id number] Page 1 of 10 Visit Report for 04/18/2023 Arrival Information Details Patient Name: Date of Service: [name], [name] 04/18/2023 12:30 PM Medical Record Number: [id number] Patient Account Number: [id number] Date of Birth/Sex: Treating RN: 20-Sep-1950 (72 y.o. Billy Coast, [name] Primary Care Averee Harb: Saralyn Pilar Other Clinician: Referring Verlena Marlette: Treating Genessa Beman/Extender: Park Liter in Treatment: 31 Visit Information History Since Last Visit Added or deleted any medications: No Patient Arrived: [name] Any new allergies or adverse reactions: No Arrival Time: 12:55 Had a fall or experienced change in No Accompanied By: self activities of daily living that may affect Transfer Assistance: None risk of falls: Patient Identification Verified: Yes Signs or symptoms of abuse/neglect since last visito No Secondary Verification Process Completed: Yes Hospitalized since last visit: No Patient Requires Transmission-Based Precautions: No Implantable device outside of the clinic excluding No Patient Has Alerts: No cellular tissue based products placed in the center since last visit: Has Dressing in Place as Prescribed: Yes Has Compression in Place as Prescribed: Yes Pain Present Now: Yes Electronic Signature(s) Signed: 04/18/2023 5:38:06 PM By: Zenaida Deed RN, BSN Entered By: Zenaida Deed on 04/18/2023 09:58:55 -------------------------------------------------------------------------------- Encounter Discharge Information Details Patient Name: Date of Service: Marisa Score. 04/18/2023 12:30 PM Medical Record Number: [id number] Patient Account Number: [id number] Date of Birth/Sex: Treating RN: 25-Oct-1950 (72 y.o. [name] Primary Care Zebbie Ace: Saralyn Pilar Other Clinician: Referring [name]: Treating [name]/Extender: Park Liter in Treatment:  31 Encounter Discharge Information Items Post Procedure Vitals Discharge Condition: Stable Temperature (F): 98.6 Ambulatory Status: Walker Pulse (bpm): 69 Discharge Destination: Home Respiratory Rate (breaths/min): 18 Transportation: Private Auto Blood Pressure (mmHg): 154/68 Accompanied By: self Schedule Follow-up Appointment: Yes Clinical Summary of Care: Patient Declined Electronic Signature(s) Signed: 04/18/2023 5:38:06 PM By: Zenaida Deed RN, BSN Entered By: Zenaida Deed on 04/18/2023 13:35:39 [name] ([id number]) [id number] Page 2 of 10 -------------------------------------------------------------------------------- Lower Extremity Assessment Details Patient Name: Date of Service: [name] 04/18/2023 12:30 PM Medical Record Number: [id number] Patient Account Number: [id number] Date of Birth/Sex: Treating RN: 01/05/1951 (72 y.o. [name] Primary Care [name]: Saralyn Pilar Other Clinician: Referring Scotti Motter: Treating [name]/Extender: [name] Weeks in Treatment: 31 Edema Assessment Assessed: [name]: No] [name]: No] Edema: [Left: Ye] [Right: s] Calf Left: Right: Point of Measurement: From Medial Instep 33 cm Ankle Left: Right: Point of Measurement: From Medial Instep 24.5 cm Vascular Assessment Pulses: Dorsalis Pedis Palpable: [Right:Yes] Extremity colors, hair growth, and conditions: Extremity Color: [Right:Normal] Hair Growth on Extremity: [Right:Yes] Temperature of Extremity: [Right:Warm] Capillary Refill: [Right:< 3 seconds] Dependent Rubor: [Right:No No] Electronic Signature(s) Signed: 04/18/2023 5:38:06 PM By: Zenaida Deed RN, BSN Entered By: Zenaida Deed on 04/18/2023 10:04:26 -------------------------------------------------------------------------------- Multi Wound Chart Details Patient Name: Date of Service: Marisa Score. 04/18/2023 12:30 PM Medical Record  Number: [id number] Patient Account Number: [id number] Date of Birth/Sex: Treating RN: 31-Mar-1951 (72 y.o. F) Primary Care [name]: Saralyn Pilar Other Clinician: Referring [name]: Treating [name]/Extender: Park Liter in Treatment: 31 Vital Signs Height(in): 62 Pulse(bpm): 69 Weight(lbs): 207 Blood Pressure(mmHg): 154/68 Body Mass Index(BMI): 37.9 Temperature(F): 98.6 Respiratory Rate(breaths/min): 20 [1:Photos:] [N/A:N/A [id number] Page 3 of 10] Right Gluteus Right Calcaneus N/A Wound Location: Pressure Injury Pressure Injury N/A Wounding Event: Pressure Ulcer Pressure Ulcer N/A Primary Etiology: Chronic sinus problems/congestion, Chronic sinus problems/congestion, N/A Comorbid History: Sleep Apnea, Congestive Heart Sleep Apnea, Congestive Heart Failure, Raynauds, Rheumatoid Failure, Raynauds, Rheumatoid Arthritis, Neuropathy Arthritis,  Neuropathy 08/11/2022 09/21/2022 N/A Date Acquired: 12 28 N/A Weeks of Treatment: Open Open N/A Wound Status: No No N/A Wound Recurrence: 2.5x3.5x6.1 0.2x0.6x0.1 N/A Measurements L x W x D (cm) 6.872 0.094 N/A A (cm) : rea 41.921 0.009 N/A Volume (cm) : 68.80% 99.00% N/A % Reduction in A rea: 68.20% 99.10% N/A % Reduction in Volume: Category/Stage IV Category/Stage IV N/A Classification: Medium Small N/A Exudate A mount: Serosanguineous Serosanguineous N/A Exudate Type: red, brown red, brown N/A Exudate Color: Well defined, not attached Distinct, outline attached N/A Wound Margin: Large (67-100%) Large (67-100%) N/A Granulation A mount: Red Red N/A Granulation Quality: None Present (0%) Small (1-33%) N/A Necrotic A mount: Fat Layer (Subcutaneous Tissue): Yes Fat Layer (Subcutaneous Tissue): Yes N/A Exposed Structures: Fascia: No Fascia: No Tendon: No Tendon: No Muscle: No Muscle: No Joint: No Joint: No Bone: No Bone: No Small (1-33%) Large (67-100%)  N/A Epithelialization: N/A Debridement - Selective/Open Wound N/A Debridement: Pre-procedure Verification/Time Out N/A 13:20 N/A Taken: N/A Lidocaine 4% Topical Solution N/A Pain Control: N/A Slough N/A Tissue Debrided: N/A Skin/Epidermis N/A Level: N/A 0.19 N/A Debridement A (sq cm): rea N/A Curette N/A Instrument: N/A Minimum N/A Bleeding: N/A Pressure N/A Hemostasis A chieved: N/A 0 N/A Procedural Pain: N/A 0 N/A Post Procedural Pain: N/A Procedure was tolerated well N/A Debridement Treatment Response: N/A 0.2x0.6x0.1 N/A Post Debridement Measurements L x W x D (cm) N/A 0.009 N/A Post Debridement Volume: (cm) N/A Category/Stage IV N/A Post Debridement Stage: Excoriation: No No Abnormalities Noted N/A Periwound Skin Texture: Induration: No Callus: No Crepitus: No Rash: No Scarring: No Maceration: No No Abnormalities Noted N/A Periwound Skin Moisture: Dry/Scaly: No Atrophie Blanche: No No Abnormalities Noted N/A Periwound Skin Color: Cyanosis: No Ecchymosis: No Erythema: No Hemosiderin Staining: No Mottled: No Pallor: No Rubor: No No Abnormality No Abnormality N/A Temperature: Yes Yes N/A Tenderness on Palpation: Negative Pressure Wound Therapy Debridement N/A Procedures Performed: Maintenance (NPWT) Treatment Notes Electronic Signature(s) Signed: 04/18/2023 1:31:57 PM By: Duanne Guess MD FACS Entered By: Duanne Guess on 04/18/2023 10:31:57 [name] ([id number]) [id number] Page 4 of 10 -------------------------------------------------------------------------------- Multi-Disciplinary Care Plan Details Patient Name: Date of Service: ZAHAVA, BREZINSKI 04/18/2023 12:30 PM Medical Record Number: [id number] Patient Account Number: [id number] Date of Birth/Sex: Treating RN: Mar 11, 1951 (72 y.o. [name] Primary Care Mayson Mcneish: Saralyn Pilar Other Clinician: Referring Duquan Gillooly: Treating Mileigh Tilley/Extender:  Park Liter in Treatment: 31 Multidisciplinary Care Plan reviewed with physician Active Inactive Pressure Nursing Diagnoses: Knowledge deficit related to management of pressures ulcers Goals: Patient will remain free of pressure ulcers Date Initiated: 09/15/2022 Date Inactivated: 09/28/2022 Target Resolution Date: 11/09/2022 Unmet Reason: new pressure ulcer Goal Status: Unmet right heel Patient/caregiver will verbalize risk factors for pressure ulcer development Date Initiated: 09/15/2022 Target Resolution Date: 05/03/2023 Goal Status: Active Interventions: Assess potential for pressure ulcer upon admission and as needed Treatment Activities: Patient referred for pressure reduction/relief devices : 09/15/2022 Pressure reduction/relief device ordered : 09/15/2022 Notes: Wound/Skin Impairment Nursing Diagnoses: Impaired tissue integrity Knowledge deficit related to ulceration/compromised skin integrity Goals: Patient/caregiver will verbalize understanding of skin care regimen Date Initiated: 09/28/2022 Target Resolution Date: 05/03/2023 Goal Status: Active Ulcer/skin breakdown will have a volume reduction of 50% by week 8 Date Initiated: 09/28/2022 Date Inactivated: 11/01/2022 Target Resolution Date: 11/02/2022 Goal Status: Met Ulcer/skin breakdown will have a volume reduction of 80% by week 12 Date Initiated: 11/01/2022 Date Inactivated: 11/29/2022 Target Resolution Date: 11/29/2022 Unmet Reason: vac in place, still Goal Status: Unmet  debriding heel Interventions: Assess patient/caregiver ability to obtain necessary supplies Assess patient/caregiver ability to perform ulcer/skin care regimen upon admission and as needed Assess ulceration(s) every visit Provide education on ulcer and skin care Treatment Activities: Skin care regimen initiated : 09/28/2022 Topical wound management initiated : 09/28/2022 Notes: Electronic Signature(s) Signed: 04/18/2023 5:38:06  PM By: Zenaida Deed RN, BSN [place], Signed:E9/11/2022 5:38:06 PM By: Zenaida Deed RN, BSN Novali ([id number]) [id number] Page 5 of 10 Entered By: Zenaida Deed on 04/18/2023 10:16:54 -------------------------------------------------------------------------------- Negative Pressure Wound Therapy Maintenance (NPWT) Details Patient Name: Date of Service: ROZELLA, DUTROW 04/18/2023 12:30 PM Medical Record Number: [id number] Patient Account Number: [id number] Date of Birth/Sex: Treating RN: 11/03/1950 (72 y.o. [name] Primary Care Kaulin Chaves: Saralyn Pilar Other Clinician: Referring Omer Monter: Treating Urvi Imes/Extender: [name] Weeks in Treatment: 31 NPWT Maintenance Performed for: Wound #1 Right Gluteus Additional Injuries Covered: No Performed By: Zenaida Deed, RN Coverage Size (sq cm): 8.75 Pressure Type: Constant Pressure Setting: 125 mmHG Drain Type: None Primary Contact: Silver Sponge/Dressing Type: Foam- Black Date Initiated: 10/16/2022 Dressing Removed: Yes Quantity of Sponges/Gauze Removed: 1 Canister Changed: Yes Dressing Reapplied: Yes Quantity of Sponges/Gauze Inserted: 1 Respones T Treatment: o good Days On NPWT : 185 Post Procedure Diagnosis Same as Pre-procedure Electronic Signature(s) Signed: 04/18/2023 5:38:06 PM By: Zenaida Deed RN, BSN Entered By: Zenaida Deed on 04/18/2023 10:23:59 -------------------------------------------------------------------------------- Pain Assessment Details Patient Name: Date of Service: Marisa Score. 04/18/2023 12:30 PM Medical Record Number: [id number] Patient Account Number: [id number] Date of Birth/Sex: Treating RN: August 09, 1951 (72 y.o. [name] Primary Care Vannah Nadal: Saralyn Pilar Other Clinician: Referring Parthenia Tellefsen: Treating Paulla Mcclaskey/Extender: Park Liter in Treatment: 31 Active Problems Location of Pain  Severity and Description of Pain Patient Has Paino Yes Site Locations Pain Location: DESAREE, PETRY ([id number]) [id number].pdf Page 6 of 10 Pain Location: Pain in Ulcers With Dressing Change: Yes Duration of the Pain. Constant / Intermittento Intermittent Rate the pain. Current Pain Level: 1 Worst Pain Level: 7 Least Pain Level: 0 Character of Pain Describe the Pain: Aching Pain Management and Medication Current Pain Management: Medication: Yes Other: reposition Is the Current Pain Management Adequate: Adequate How does your wound impact your activities of daily livingo Sleep: Yes Bathing: No Appetite: No Relationship With Others: No Bladder Continence: No Emotions: No Bowel Continence: No Work: No Toileting: No Drive: No Dressing: No Hobbies: No Electronic Signature(s) Signed: 04/18/2023 5:38:06 PM By: Zenaida Deed RN, BSN Entered By: Zenaida Deed on 04/18/2023 09:59:51 -------------------------------------------------------------------------------- Patient/Caregiver Education Details Patient Name: Date of Service: Marisa Score 9/4/2024andnbsp12:30 PM Medical Record Number: [id number] Patient Account Number: [id number] Date of Birth/Gender: Treating RN: 01/07/1951 (72 y.o. [name] Primary Care Physician: Saralyn Pilar Other Clinician: Referring Physician: Treating Physician/Extender: Park Liter in Treatment: 31 Education Assessment Education Provided To: Patient Education Topics Provided Pressure: Methods: Explain/Verbal Responses: Reinforcements needed, State content correctly Wound/Skin Impairment: Methods: Explain/Verbal Responses: Reinforcements needed, State content correctly [employer]) Signed: 04/18/2023 5:38:06 PM By: Zenaida Deed RN, BSN Harl, Signed:E9/11/2022 5:38:06 PM By: Zenaida Deed RN, BSN Levenia ([id number]) [id number] Page 7  of 10 Entered By: Zenaida Deed on 04/18/2023 10:17:20 -------------------------------------------------------------------------------- Wound Assessment Details Patient Name: Date of Service: CASSY, SHEAN 04/18/2023 12:30 PM Medical Record Number: [id number] Patient Account Number: [id number] Date of Birth/Sex: Treating RN: 1951/04/28 (72 y.o. [name] Primary Care Dangelo Guzzetta: Saralyn Pilar Other Clinician: Referring Quintavious Rinck: Treating Cerria Randhawa/Extender: [name] Weeks in  Treatment: 31 Wound Status Wound Number: 1 Primary Pressure Ulcer Etiology: Wound Location: Right Gluteus Wound Open Wounding Event: Pressure Injury Status: Date Acquired: 08/11/2022 Comorbid Chronic sinus problems/congestion, Sleep Apnea, Congestive Heart Weeks Of Treatment: 31 History: Failure, Raynauds, Rheumatoid Arthritis, Neuropathy Clustered Wound: No Photos Wound Measurements Length: (cm) 2.5 Width: (cm) 3.5 Depth: (cm) 6.1 Area: (cm) 6.872 Volume: (cm) 41.921 % Reduction in Area: 68.8% % Reduction in Volume: 68.2% Epithelialization: Small (1-33%) Tunneling: No Undermining: No Wound Description Classification: Category/Stage IV Wound Margin: Well defined, not attached Exudate Amount: Medium Exudate Type: Serosanguineous Exudate Color: red, brown Foul Odor After Cleansing: No Slough/Fibrino Yes Wound Bed Granulation Amount: Large (67-100%) Exposed Structure Granulation Quality: Red Fascia Exposed: No Necrotic Amount: None Present (0%) Fat Layer (Subcutaneous Tissue) Exposed: Yes Tendon Exposed: No Muscle Exposed: No Joint Exposed: No Bone Exposed: No Periwound Skin Texture Texture Color No Abnormalities Noted: No No Abnormalities Noted: Yes Callus: No Temperature / Pain Crepitus: No Temperature: No Abnormality Excoriation: No Tenderness on Palpation: Yes Induration: No Rash: No Scarring: No Moisture MONROE, PRUCHA ([id number])  [id number] Page 8 of 10 No Abnormalities Noted: Yes Treatment Notes Wound #1 (Gluteus) Wound Laterality: Right Cleanser Wound Cleanser Discharge Instruction: Cleanse the wound with wound cleanser prior to applying a clean dressing using gauze sponges, not tissue or cotton balls. Peri-Wound Care Nystop Powder (Nystatin) Discharge Instruction: Apply Nystop (Nystatin) Powder as needed to irritated skin Topical Primary Dressing Promogran Prisma Matrix, 4.34 (sq in) (silver collagen) Discharge Instruction: Moisten collagen with saline or hydrogel Maxorb Extra Calcium Alginate, 2x2 (in/in) Discharge Instruction: Apply to weeping periwound under drape as needed for maceration VAC Secondary Dressing Secured With Compression Wrap Compression Stockings Add-Ons Electronic Signature(s) Signed: 04/18/2023 5:38:06 PM By: Zenaida Deed RN, BSN Entered By: Zenaida Deed on 04/18/2023 10:15:34 -------------------------------------------------------------------------------- Wound Assessment Details Patient Name: Date of Service: Marisa Score. 04/18/2023 12:30 PM Medical Record Number: [id number] Patient Account Number: [id number] Date of Birth/Sex: Treating RN: [birth year] (72 y.o. [name] Primary Care Wadell Craddock: Saralyn Pilar Other Clinician: Referring Miko Markwood: Treating Jermone Geister/Extender: [name] Weeks in Treatment: 31 Wound Status Wound Number: 3 Primary Pressure Ulcer Etiology: Wound Location: Right Calcaneus Wound Open Wounding Event: Pressure Injury Status: Date Acquired: 09/21/2022 Comorbid Chronic sinus problems/congestion, Sleep Apnea, Congestive Heart Weeks Of Treatment: 28 History: Failure, Raynauds, Rheumatoid Arthritis, Neuropathy Clustered Wound: No Photos MATILDA, WALDON ([id number]) [id number] Page 9 of 10 Wound Measurements Length: (cm) 0.2 Width: (cm) 0.6 Depth: (cm)  0.1 Area: (cm) 0.094 Volume: (cm) 0.009 % Reduction in Area: 99% % Reduction in Volume: 99.1% Epithelialization: Large (67-100%) Tunneling: No Undermining: No Wound Description Classification: Category/Stage IV Wound Margin: Distinct, outline attached Exudate Amount: Small Exudate Type: Serosanguineous Exudate Color: red, brown Foul Odor After Cleansing: No Slough/Fibrino Yes Wound Bed Granulation Amount: Large (67-100%) Exposed Structure Granulation Quality: Red Fascia Exposed: No Necrotic Amount: Small (1-33%) Fat Layer (Subcutaneous Tissue) Exposed: Yes Necrotic Quality: Adherent Slough Tendon Exposed: No Muscle Exposed: No Joint Exposed: No Bone Exposed: No Periwound Skin Texture Texture Color No Abnormalities Noted: Yes No Abnormalities Noted: Yes Moisture Temperature / Pain No Abnormalities Noted: Yes Temperature: No Abnormality Tenderness on Palpation: Yes Treatment Notes Wound #3 (Calcaneus) Wound Laterality: Right Cleanser Peri-Wound Care Topical Gentamicin Discharge Instruction: mix 50:50 with mupirocin and rub into wound bed Mupirocin Ointment Discharge Instruction: Apply Mupirocin (Bactroban) as instructed Primary Dressing Maxorb Extra Ag+ Alginate Dressing, 4x4.75 (in/in) Discharge Instruction: Apply to wound bed as instructed Secondary Dressing  ALLEVYN Heel 4 1/2in x 5 1/2in / 10.5cm x 13.5cm Discharge Instruction: Apply over primary dressing as directed. Woven Gauze Sponge, Non-Sterile 4x4 in Discharge Instruction: Apply over primary dressing as directed. Secured With [employer], 4.5x3.1 (in/yd) Discharge Instruction: Secure with Kerlix as directed. Compression Wrap Compression Stockings Circaid Juxta Lite Compression Wrap Quantity: 1 Right Leg Compression Amount: 30-40 mmHg Discharge Instruction: Apply Circaid Juxta Lite Compression Wrap daily as instructed. Apply first thing in the morning, remove at night  before bed. Add-Ons Electronic Signature(s) Signed: 04/18/2023 5:38:06 PM By: Zenaida Deed RN, BSN Entered By: Zenaida Deed on 04/18/2023 10:07:24 [name] ([id number]) [id number] Page 10 of 10 -------------------------------------------------------------------------------- Vitals Details Patient Name: Date of Service: SIMAYA, KRAMER 04/18/2023 12:30 PM Medical Record Number: [id number] Patient Account Number: [id number] Date of Birth/Sex: Treating RN: 22-Apr-1951 (72 y.o. [name] Primary Care Nicolaos Mitrano: Saralyn Pilar Other Clinician: Referring Fedra Lanter: Treating [name] Shevchenko/Extender: [name] Weeks in Treatment: 31 Vital Signs Time Taken: 12:58 Temperature (F): 98.6 Height (in): 62 Pulse (bpm): 69 Weight (lbs): 207 Respiratory Rate (breaths/min): 20 Body Mass Index (BMI): 37.9 Blood Pressure (mmHg): 154/68 Reference Range: 80 - 120 mg / dl Electronic Signature(s) Signed: 04/18/2023 5:38:06 PM By: Zenaida Deed RN, BSN Entered By: Zenaida Deed on 04/18/2023 09:59:17

## 2023-04-24 ENCOUNTER — Other Ambulatory Visit: Payer: Self-pay | Admitting: Nurse Practitioner

## 2023-04-24 DIAGNOSIS — F418 Other specified anxiety disorders: Secondary | ICD-10-CM

## 2023-04-24 DIAGNOSIS — M79604 Pain in right leg: Secondary | ICD-10-CM

## 2023-04-24 DIAGNOSIS — S7291XD Unspecified fracture of right femur, subsequent encounter for closed fracture with routine healing: Secondary | ICD-10-CM

## 2023-04-25 DIAGNOSIS — L89314 Pressure ulcer of right buttock, stage 4: Secondary | ICD-10-CM | POA: Diagnosis not present

## 2023-05-02 ENCOUNTER — Encounter (HOSPITAL_BASED_OUTPATIENT_CLINIC_OR_DEPARTMENT_OTHER): Payer: PPO | Admitting: General Surgery

## 2023-05-02 DIAGNOSIS — L89314 Pressure ulcer of right buttock, stage 4: Secondary | ICD-10-CM | POA: Diagnosis not present

## 2023-05-02 NOTE — Progress Notes (Signed)
[name], [name] ([id number]) [id number] Page 1 of 10 Visit Report for 05/02/2023 Chief Complaint Document Details Patient Name: Date of Service: [name], [name] 05/02/2023 12:30 PM Medical Record Number: [id number] Patient Account Number: [id number] Date of Birth/Sex: Treating RN: Sep 25, 1950 (72 y.o. F) Primary Care Provider: Saralyn Pilar Other Clinician: Referring Provider: Treating Provider/Extender: Park Liter in Treatment: 24 Information Obtained from: Patient Chief Complaint Patient is at the clinic for treatment of open pressure ulcers Electronic Signature(s) Signed: 05/02/2023 1:49:09 PM By: Duanne Guess MD FACS Entered By: Duanne Guess on 05/02/2023 10:49:08 -------------------------------------------------------------------------------- HPI Details Patient Name: Date of Service: Marisa Roux Cooper. 05/02/2023 12:30 PM Medical Record Number: [id number] Patient Account Number: [id number] Date of Birth/Sex: Treating RN: 1951/08/12 (72 y.o. F) Primary Care Provider: Saralyn Pilar Other Clinician: Referring Provider: Treating Provider/Extender: Park Liter in Treatment: 21 History of Present Illness HPI Description: ADMISSION 09/07/2023 This is a 72 year old woman with a past medical history notable for obesity, congestive heart failure, Raynaud's syndrome, CKD stage IIIb, osteoporosis, and rheumatoid arthritis. In November 2023, she suffered a fall that resulted in a femur fracture. She was hospitalized for about a week and underwent surgical repair of the fracture. She subsequently developed pressure ulcers on her right buttocks and ischium. She has been receiving home health services and they have been applying Medihoney. They have been reporting the wounds as stage II, but on evaluation, the large ulcer was probably unstageable at the time they were evaluating it but it is clearly a  stage IV at this point. The smaller ulcer does have fat layer exposure and therefore is a stage III. The patient is accompanied by her daughter. She says she has been sleeping on a regular bed but recently ordered an air mattress T opper, but does not have it yet. She is on Ozempic for weight loss and therefore has a poor appetite and struggles to get adequate protein intake. 09/15/2022: The large stage IV ulcer is substantially cleaner this week. The stage III ulcer is a little smaller. Both have slough accumulation. The culture that I took last week was polymicrobial. The Augmentin that I prescribed was adequate coverage for the species and she continues to take this. We have also ordered Keystone topical antibiotic compound, but this has not yet arrived. 09/21/2022: The stage IV ulcer has some necrotic muscle at the base but is otherwise fairly clean. The stage III ulcer is smaller again with light slough on the surface. She has her Keystone topical antibiotic compound with her today. 09/29/2022: There is still some necrotic muscle at the base of the stage IV ulcer that I was unable to get to last week. The stage III ulcer has healed. She unfortunately has developed a new pressure ulcer on her right heel. 10/06/2022: Still with some devitalized muscle at the base of the stage IV ulcer, but otherwise this wound is looking quite clean. The pressure induced tissue injury on her heel is demarcating and much of the area appears to be epithelialized, but there is still 2 areas that remain questionable. 10/12/2022: The stage IV ulcer is very clean and ready for wound VAC, which will be delivered tomorrow according to the patient. The tissue injury on her heel is drying up and does not feel particularly boggy this week. 10/19/2022: The heel injury continues to improve. There is still some dry eschar present on the more plantar aspect of it. There is some slough accumulation on the surface of  [name], [name] ([id number]) [id number] Page 1 of 10 Visit Report for 05/02/2023 Chief Complaint Document Details Patient Name: Date of Service: [name], [name] 05/02/2023 12:30 PM Medical Record Number: [id number] Patient Account Number: [id number] Date of Birth/Sex: Treating RN: Sep 25, 1950 (72 y.o. F) Primary Care Provider: Saralyn Pilar Other Clinician: Referring Provider: Treating Provider/Extender: Park Liter in Treatment: 24 Information Obtained from: Patient Chief Complaint Patient is at the clinic for treatment of open pressure ulcers Electronic Signature(s) Signed: 05/02/2023 1:49:09 PM By: Duanne Guess MD FACS Entered By: Duanne Guess on 05/02/2023 10:49:08 -------------------------------------------------------------------------------- HPI Details Patient Name: Date of Service: Marisa Roux Cooper. 05/02/2023 12:30 PM Medical Record Number: [id number] Patient Account Number: [id number] Date of Birth/Sex: Treating RN: 1951/08/12 (72 y.o. F) Primary Care Provider: Saralyn Pilar Other Clinician: Referring Provider: Treating Provider/Extender: Park Liter in Treatment: 21 History of Present Illness HPI Description: ADMISSION 09/07/2023 This is a 72 year old woman with a past medical history notable for obesity, congestive heart failure, Raynaud's syndrome, CKD stage IIIb, osteoporosis, and rheumatoid arthritis. In November 2023, she suffered a fall that resulted in a femur fracture. She was hospitalized for about a week and underwent surgical repair of the fracture. She subsequently developed pressure ulcers on her right buttocks and ischium. She has been receiving home health services and they have been applying Medihoney. They have been reporting the wounds as stage II, but on evaluation, the large ulcer was probably unstageable at the time they were evaluating it but it is clearly a  stage IV at this point. The smaller ulcer does have fat layer exposure and therefore is a stage III. The patient is accompanied by her daughter. She says she has been sleeping on a regular bed but recently ordered an air mattress T opper, but does not have it yet. She is on Ozempic for weight loss and therefore has a poor appetite and struggles to get adequate protein intake. 09/15/2022: The large stage IV ulcer is substantially cleaner this week. The stage III ulcer is a little smaller. Both have slough accumulation. The culture that I took last week was polymicrobial. The Augmentin that I prescribed was adequate coverage for the species and she continues to take this. We have also ordered Keystone topical antibiotic compound, but this has not yet arrived. 09/21/2022: The stage IV ulcer has some necrotic muscle at the base but is otherwise fairly clean. The stage III ulcer is smaller again with light slough on the surface. She has her Keystone topical antibiotic compound with her today. 09/29/2022: There is still some necrotic muscle at the base of the stage IV ulcer that I was unable to get to last week. The stage III ulcer has healed. She unfortunately has developed a new pressure ulcer on her right heel. 10/06/2022: Still with some devitalized muscle at the base of the stage IV ulcer, but otherwise this wound is looking quite clean. The pressure induced tissue injury on her heel is demarcating and much of the area appears to be epithelialized, but there is still 2 areas that remain questionable. 10/12/2022: The stage IV ulcer is very clean and ready for wound VAC, which will be delivered tomorrow according to the patient. The tissue injury on her heel is drying up and does not feel particularly boggy this week. 10/19/2022: The heel injury continues to improve. There is still some dry eschar present on the more plantar aspect of it. There is some slough accumulation on the surface of  [name], [name] ([id number]) [id number] Page 1 of 10 Visit Report for 05/02/2023 Chief Complaint Document Details Patient Name: Date of Service: [name], [name] 05/02/2023 12:30 PM Medical Record Number: [id number] Patient Account Number: [id number] Date of Birth/Sex: Treating RN: Sep 25, 1950 (72 y.o. F) Primary Care Provider: Saralyn Pilar Other Clinician: Referring Provider: Treating Provider/Extender: Park Liter in Treatment: 24 Information Obtained from: Patient Chief Complaint Patient is at the clinic for treatment of open pressure ulcers Electronic Signature(s) Signed: 05/02/2023 1:49:09 PM By: Duanne Guess MD FACS Entered By: Duanne Guess on 05/02/2023 10:49:08 -------------------------------------------------------------------------------- HPI Details Patient Name: Date of Service: Marisa Roux Cooper. 05/02/2023 12:30 PM Medical Record Number: [id number] Patient Account Number: [id number] Date of Birth/Sex: Treating RN: 1951/08/12 (72 y.o. F) Primary Care Provider: Saralyn Pilar Other Clinician: Referring Provider: Treating Provider/Extender: Park Liter in Treatment: 21 History of Present Illness HPI Description: ADMISSION 09/07/2023 This is a 72 year old woman with a past medical history notable for obesity, congestive heart failure, Raynaud's syndrome, CKD stage IIIb, osteoporosis, and rheumatoid arthritis. In November 2023, she suffered a fall that resulted in a femur fracture. She was hospitalized for about a week and underwent surgical repair of the fracture. She subsequently developed pressure ulcers on her right buttocks and ischium. She has been receiving home health services and they have been applying Medihoney. They have been reporting the wounds as stage II, but on evaluation, the large ulcer was probably unstageable at the time they were evaluating it but it is clearly a  stage IV at this point. The smaller ulcer does have fat layer exposure and therefore is a stage III. The patient is accompanied by her daughter. She says she has been sleeping on a regular bed but recently ordered an air mattress T opper, but does not have it yet. She is on Ozempic for weight loss and therefore has a poor appetite and struggles to get adequate protein intake. 09/15/2022: The large stage IV ulcer is substantially cleaner this week. The stage III ulcer is a little smaller. Both have slough accumulation. The culture that I took last week was polymicrobial. The Augmentin that I prescribed was adequate coverage for the species and she continues to take this. We have also ordered Keystone topical antibiotic compound, but this has not yet arrived. 09/21/2022: The stage IV ulcer has some necrotic muscle at the base but is otherwise fairly clean. The stage III ulcer is smaller again with light slough on the surface. She has her Keystone topical antibiotic compound with her today. 09/29/2022: There is still some necrotic muscle at the base of the stage IV ulcer that I was unable to get to last week. The stage III ulcer has healed. She unfortunately has developed a new pressure ulcer on her right heel. 10/06/2022: Still with some devitalized muscle at the base of the stage IV ulcer, but otherwise this wound is looking quite clean. The pressure induced tissue injury on her heel is demarcating and much of the area appears to be epithelialized, but there is still 2 areas that remain questionable. 10/12/2022: The stage IV ulcer is very clean and ready for wound VAC, which will be delivered tomorrow according to the patient. The tissue injury on her heel is drying up and does not feel particularly boggy this week. 10/19/2022: The heel injury continues to improve. There is still some dry eschar present on the more plantar aspect of it. There is some slough accumulation on the surface of  Shower/ Hygiene May shower and wash wound with soap and water. Negative Presssure Wound Therapy Wound #1 Right  Gluteus Wound Vac to wound continuously at 116mm/hg pressure Black Foam Off-Loading Heel suspension boot to: - globoped right foot to ambulate Low air-loss mattress (Group 2) - Adapt Turn and reposition every 2 hours - avoid lying on back, stand at least every hour while out of bed, float heels off bed with pillows under calves while in bed Prevalon Boot - right foot esp. while in bed. Additional Orders / Instructions Follow Nutritious Diet - increase protein to 70-80 gms per day, hold ozempic Juven Shake 1-2 times daily. Home Health New wound care orders this week; continue Home Health for wound care. May utilize formulary equivalent dressing for wound treatment orders unless otherwise specified. Other Home Health Orders/Instructions: - [facility] Wound Treatment Wound #1 - Gluteus Wound Laterality: Right Cleanser: Wound Cleanser 3 x Per Week/30 Days Discharge Instructions: Cleanse the wound with wound cleanser prior to applying a clean dressing using gauze sponges, not tissue or cotton balls. Peri-Wound Care: Nystop Powder (Nystatin) 3 x Per Week/30 Days Discharge Instructions: Apply Nystop (Nystatin) Powder as needed to irritated skin Prim Dressing: Promogran Prisma Matrix, 4.34 (sq in) (silver collagen) 3 x Per Week/30 Days ary Discharge Instructions: Moisten collagen with saline or hydrogel GRAVIELA, [name] ([id number]) [id number] Page 4 of 10 Prim Dressing: Maxorb Extra Calcium Alginate, 2x2 (in/in) 3 x Per Week/30 Days ary Discharge Instructions: Apply to irritated periwound under drape as needed for maceration Prim Dressing: VAC ary 3 x Per Week/30 Days Patient Medications llergies: Sulfa (Sulfonamide Antibiotics), Cymbalta, Demerol, Iodinated Contrast Media, morphine, sulfasalazine, adhesive A Notifications Medication Indication Start End 05/02/2023 Antifungal (miconazole) DOSE topical 2 % powder - Apply to skin around gluteal ulcer with dressing  changes Electronic Signature(s) Signed: 05/02/2023 4:02:03 PM By: Duanne Guess MD FACS Previous Signature: 05/02/2023 1:56:12 PM Version By: Duanne Guess MD FACS Entered By: Duanne Guess on 05/02/2023 10:56:27 -------------------------------------------------------------------------------- Problem List Details Patient Name: Date of Service: [name]. 05/02/2023 12:30 PM Medical Record Number: [id number] Patient Account Number: [id number] Date of Birth/Sex: Treating RN: 02-24-1951 (72 y.o. 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) 09/07/2022 No Yes E66.9 Obesity, unspecified 09/07/2022 No Yes Inactive Problems ICD-10 Code Description Active Date Inactive Date L89.313 Pressure ulcer of right buttock, stage 3 09/07/2022 09/07/2022 NEILY, GABLER ([id number]) [id number] Page 5 of 10 Resolved Problems ICD-10 Code Description Active Date Resolved Date L89.614 Pressure ulcer of right heel, stage 4 09/28/2022 09/28/2022 Electronic Signature(s) Signed: 05/02/2023 1:47:58 PM By: Duanne Guess MD FACS Entered By: Duanne Guess on 05/02/2023 10:47:58 -------------------------------------------------------------------------------- Progress Note Details Patient Name: Date of Service: [name]. 05/02/2023 12:30 PM Medical Record Number: [id number] Patient Account Number: [id number] Date of Birth/Sex: Treating RN: Jan 11, 1951 (72 y.o.  F) Primary Care Provider: Saralyn Pilar Other Clinician: Referring Provider: Treating Provider/Extender: Park Liter in Treatment: 84 Subjective Chief Complaint Information obtained from Patient Patient is at the clinic for treatment of open pressure ulcers History of Present Illness (HPI) ADMISSION 09/07/2023 This is a 72 year old  going to apply miconazole powder around the wound and cover this with silver alginate and reapply the wound VAC. Follow-up in 2 weeks. Electronic Signature(s) Signed: 05/02/2023 1:57:02 PM By: Duanne Guess MD FACS Previous Signature: 05/02/2023 1:54:12 PM Version By: Duanne Guess MD FACS Entered By: Duanne Guess on 05/02/2023 10:57:02 -------------------------------------------------------------------------------- HxROS Details Patient Name: Date of Service: Marisa Roux Cooper. 05/02/2023 12:30 PM Medical Record Number: [id number] Patient Account Number: [id number] Date of Birth/Sex: Treating RN: 26-May-1951 (72 y.o. F) Primary Care Provider: Saralyn Pilar Other Clinician: Referring Provider: Treating Provider/Extender: Park Liter in Treatment: 33 Information Obtained From Patient Caregiver Chart Constitutional Symptoms (General Health) Medical History: Past Medical History Notes: morbid obesity Eyes Medical History: Negative for: Cataracts; Glaucoma; Optic Neuritis Ear/Nose/Mouth/Throat Medical History: Positive for: Chronic sinus problems/congestion - chronic rhinitis Negative for: Middle ear problems Respiratory Medical History: Positive for: Sleep Apnea - uses CPAP Cardiovascular Medical History: Positive for: Congestive Heart Failure Past Medical History Notes: hyperlipidemia [name], [name] ([id number]) [id number] Page 9 of 10 Gastrointestinal Medical History: Past Medical History Notes: GERD, IBS, eosinophilic esophagitis, diverticulosis, Endocrine Medical History: Negative for: Type I Diabetes; Type II Diabetes Genitourinary Medical History: Negative for: End Stage Renal Disease Past Medical History Notes: CKD stage 3 Immunological Medical History: Positive for: Raynauds Negative for: Lupus Erythematosus;  Scleroderma Past Medical History Notes: Sjoegren syndrome, fibromyalgia Integumentary (Skin) Medical History: Negative for: History of Burn Musculoskeletal Medical History: Positive for: Rheumatoid Arthritis Negative for: Gout; Osteoarthritis; Osteomyelitis Neurologic Medical History: Positive for: Neuropathy Negative for: Dementia; Quadriplegia; Paraplegia; Seizure Disorder Oncologic Medical History: Negative for: Received Chemotherapy; Received Radiation Psychiatric Medical History: Negative for: Anorexia/bulimia; Confinement Anxiety HBO Extended History Items Ear/Nose/Mouth/Throat: Chronic sinus problems/congestion Immunizations Pneumococcal Vaccine: Received Pneumococcal Vaccination: Yes Received Pneumococcal Vaccination On or After 60th Birthday: Yes Implantable Devices None Hospitalization / Surgery History Type of Hospitalization/Surgery right femur fx ORIR right knee replacement left breast mass excision left ankle tibia repair abdominal hysterectomy adnoidectomy/tonsillectomy cholecystectomy dental implants patoid cystectomy tubal ligation [name], [name] ([id number]) [id number] Page 10 of 10 Family and Social History Cancer: Yes - Father,Paternal Grandparents; Diabetes: No; Heart Disease: Yes - Mother,Father,Paternal Grandparents; Hereditary Spherocytosis: No; Hypertension: Yes - Mother; Kidney Disease: No; Lung Disease: No; Seizures: No; Stroke: Yes - Maternal Grandparents; Thyroid Problems: No; Tuberculosis: No; Never smoker; Marital Status - Widowed; Alcohol Use: Rarely; Drug Use: Prior History - TCH; Caffeine Use: Daily - soda, tea; Financial Concerns: No; Food, Clothing or Shelter Needs: No; Support System Lacking: No; Transportation Concerns: No Electronic Signature(s) Signed: 05/02/2023 4:02:03 PM By: Duanne Guess MD FACS Entered By: Duanne Guess on 05/02/2023  10:51:30 -------------------------------------------------------------------------------- SuperBill Details Patient Name: Date of Service: [name] 05/02/2023 Medical Record Number: [id number] Patient Account Number: [id number] Date of Birth/Sex: Treating RN: 08/01/1951 (72 y.o. 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 Negative Presssure Wound Therapy Wound #1 Right  Gluteus Wound Vac to wound continuously at 116mm/hg pressure Black Foam Off-Loading Heel suspension boot to: - globoped right foot to ambulate Low air-loss mattress (Group 2) - Adapt Turn and reposition every 2 hours - avoid lying on back, stand at least every hour while out of bed, float heels off bed with pillows under calves while in bed Prevalon Boot - right foot esp. while in bed. Additional Orders / Instructions Follow Nutritious Diet - increase protein to 70-80 gms per day, hold ozempic Juven Shake 1-2 times daily. Home Health New wound care orders this week; continue Home Health for wound care. May utilize formulary equivalent dressing for wound treatment orders unless otherwise specified. Other Home Health Orders/Instructions: - [facility] Wound Treatment Wound #1 - Gluteus Wound Laterality: Right Cleanser: Wound Cleanser 3 x Per Week/30 Days Discharge Instructions: Cleanse the wound with wound cleanser prior to applying a clean dressing using gauze sponges, not tissue or cotton balls. Peri-Wound Care: Nystop Powder (Nystatin) 3 x Per Week/30 Days Discharge Instructions: Apply Nystop (Nystatin) Powder as needed to irritated skin Prim Dressing: Promogran Prisma Matrix, 4.34 (sq in) (silver collagen) 3 x Per Week/30 Days ary Discharge Instructions: Moisten collagen with saline or hydrogel GRAVIELA, [name] ([id number]) [id number] Page 4 of 10 Prim Dressing: Maxorb Extra Calcium Alginate, 2x2 (in/in) 3 x Per Week/30 Days ary Discharge Instructions: Apply to irritated periwound under drape as needed for maceration Prim Dressing: VAC ary 3 x Per Week/30 Days Patient Medications llergies: Sulfa (Sulfonamide Antibiotics), Cymbalta, Demerol, Iodinated Contrast Media, morphine, sulfasalazine, adhesive A Notifications Medication Indication Start End 05/02/2023 Antifungal (miconazole) DOSE topical 2 % powder - Apply to skin around gluteal ulcer with dressing  changes Electronic Signature(s) Signed: 05/02/2023 4:02:03 PM By: Duanne Guess MD FACS Previous Signature: 05/02/2023 1:56:12 PM Version By: Duanne Guess MD FACS Entered By: Duanne Guess on 05/02/2023 10:56:27 -------------------------------------------------------------------------------- Problem List Details Patient Name: Date of Service: [name]. 05/02/2023 12:30 PM Medical Record Number: [id number] Patient Account Number: [id number] Date of Birth/Sex: Treating RN: 02-24-1951 (72 y.o. 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) 09/07/2022 No Yes E66.9 Obesity, unspecified 09/07/2022 No Yes Inactive Problems ICD-10 Code Description Active Date Inactive Date L89.313 Pressure ulcer of right buttock, stage 3 09/07/2022 09/07/2022 NEILY, GABLER ([id number]) [id number] Page 5 of 10 Resolved Problems ICD-10 Code Description Active Date Resolved Date L89.614 Pressure ulcer of right heel, stage 4 09/28/2022 09/28/2022 Electronic Signature(s) Signed: 05/02/2023 1:47:58 PM By: Duanne Guess MD FACS Entered By: Duanne Guess on 05/02/2023 10:47:58 -------------------------------------------------------------------------------- Progress Note Details Patient Name: Date of Service: [name]. 05/02/2023 12:30 PM Medical Record Number: [id number] Patient Account Number: [id number] Date of Birth/Sex: Treating RN: Jan 11, 1951 (72 y.o.  F) Primary Care Provider: Saralyn Pilar Other Clinician: Referring Provider: Treating Provider/Extender: Park Liter in Treatment: 84 Subjective Chief Complaint Information obtained from Patient Patient is at the clinic for treatment of open pressure ulcers History of Present Illness (HPI) ADMISSION 09/07/2023 This is a 72 year old  Shower/ Hygiene May shower and wash wound with soap and water. Negative Presssure Wound Therapy Wound #1 Right  Gluteus Wound Vac to wound continuously at 116mm/hg pressure Black Foam Off-Loading Heel suspension boot to: - globoped right foot to ambulate Low air-loss mattress (Group 2) - Adapt Turn and reposition every 2 hours - avoid lying on back, stand at least every hour while out of bed, float heels off bed with pillows under calves while in bed Prevalon Boot - right foot esp. while in bed. Additional Orders / Instructions Follow Nutritious Diet - increase protein to 70-80 gms per day, hold ozempic Juven Shake 1-2 times daily. Home Health New wound care orders this week; continue Home Health for wound care. May utilize formulary equivalent dressing for wound treatment orders unless otherwise specified. Other Home Health Orders/Instructions: - [facility]ound Treatment Wound #1 - Gluteus Wound Laterality: Right Cleanser: Wound Cleanser 3 x Per Week/30 Days Discharge Instructions: Cleanse the wound with wound cleanser prior to applying a clean dressing using gauze sponges, not tissue or cotton balls. Peri-Wound Care: Nystop Powder (Nystatin) 3 x Per Week/30 Days Discharge Instructions: Apply Nystop (Nystatin) Powder as needed to irritated skin Prim Dressing: Promogran Prisma Matrix, 4.34 (sq in) (silver collagen) 3 x Per Week/30 Days ary Discharge Instructions: Moisten collagen with saline or hydrogel GRAVIELA, [name] ([id number]) [id number] Page 4 of 10 Prim Dressing: Maxorb Extra Calcium Alginate, 2x2 (in/in) 3 x Per Week/30 Days ary Discharge Instructions: Apply to irritated periwound under drape as needed for maceration Prim Dressing: VAC ary 3 x Per Week/30 Days Patient Medications llergies: Sulfa (Sulfonamide Antibiotics), Cymbalta, Demerol, Iodinated Contrast Media, morphine, sulfasalazine, adhesive A Notifications Medication Indication Start End 05/02/2023 Antifungal (miconazole) DOSE topical 2 % powder - Apply to skin around gluteal ulcer with dressing  changes Electronic Signature(s) Signed: 05/02/2023 4:02:03 PM By: Duanne Guess MD FACS Previous Signature: 05/02/2023 1:56:12 PM Version By: Duanne Guess MD FACS Entered By: Duanne Guess on 05/02/2023 10:56:27 -------------------------------------------------------------------------------- Problem List Details Patient Name: Date of Service: [name]. 05/02/2023 12:30 PM Medical Record Number: [id number] Patient Account Number: [id number] Date of Birth/Sex: Treating RN: 02-24-1951 (72 y.o. 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) 09/07/2022 No Yes E66.9 Obesity, unspecified 09/07/2022 No Yes Inactive Problems ICD-10 Code Description Active Date Inactive Date L89.313 Pressure ulcer of right buttock, stage 3 09/07/2022 09/07/2022 NEILY, GABLER ([id number]) [id number] Page 5 of 10 Resolved Problems ICD-10 Code Description Active Date Resolved Date L89.614 Pressure ulcer of right heel, stage 4 09/28/2022 09/28/2022 Electronic Signature(s) Signed: 05/02/2023 1:47:58 PM By: Duanne Guess MD FACS Entered By: Duanne Guess on 05/02/2023 10:47:58 -------------------------------------------------------------------------------- Progress Note Details Patient Name: Date of Service: [name]. 05/02/2023 12:30 PM Medical Record Number: [id number] Patient Account Number: [id number] Date of Birth/Sex: Treating RN: Jan 11, 1951 (72 y.o.  F) Primary Care Provider: Saralyn Pilar Other Clinician: Referring Provider: Treating Provider/Extender: Park Liter in Treatment: 84 Subjective Chief Complaint Information obtained from Patient Patient is at the clinic for treatment of open pressure ulcers History of Present Illness (HPI) ADMISSION 09/07/2023 This is a 72 year old  going to apply miconazole powder around the wound and cover this with silver alginate and reapply the wound VAC. Follow-up in 2 weeks. Electronic Signature(s) Signed: 05/02/2023 1:57:02 PM By: Duanne Guess MD FACS Previous Signature: 05/02/2023 1:54:12 PM Version By: Duanne Guess MD FACS Entered By: Duanne Guess on 05/02/2023 10:57:02 -------------------------------------------------------------------------------- HxROS Details Patient Name: Date of Service: Marisa Roux Cooper. 05/02/2023 12:30 PM Medical Record Number: [id number] Patient Account Number: [id number] Date of Birth/Sex: Treating RN: 26-May-1951 (72 y.o. F) Primary Care Provider: Saralyn Pilar Other Clinician: Referring Provider: Treating Provider/Extender: Park Liter in Treatment: 33 Information Obtained From Patient Caregiver Chart Constitutional Symptoms (General Health) Medical History: Past Medical History Notes: morbid obesity Eyes Medical History: Negative for: Cataracts; Glaucoma; Optic Neuritis Ear/Nose/Mouth/Throat Medical History: Positive for: Chronic sinus problems/congestion - chronic rhinitis Negative for: Middle ear problems Respiratory Medical History: Positive for: Sleep Apnea - uses CPAP Cardiovascular Medical History: Positive for: Congestive Heart Failure Past Medical History Notes: hyperlipidemia [name], [name] ([id number]) [id number] Page 9 of 10 Gastrointestinal Medical History: Past Medical History Notes: GERD, IBS, eosinophilic esophagitis, diverticulosis, Endocrine Medical History: Negative for: Type I Diabetes; Type II Diabetes Genitourinary Medical History: Negative for: End Stage Renal Disease Past Medical History Notes: CKD stage 3 Immunological Medical History: Positive for: Raynauds Negative for: Lupus Erythematosus;  Scleroderma Past Medical History Notes: Sjoegren syndrome, fibromyalgia Integumentary (Skin) Medical History: Negative for: History of Burn Musculoskeletal Medical History: Positive for: Rheumatoid Arthritis Negative for: Gout; Osteoarthritis; Osteomyelitis Neurologic Medical History: Positive for: Neuropathy Negative for: Dementia; Quadriplegia; Paraplegia; Seizure Disorder Oncologic Medical History: Negative for: Received Chemotherapy; Received Radiation Psychiatric Medical History: Negative for: Anorexia/bulimia; Confinement Anxiety HBO Extended History Items Ear/Nose/Mouth/Throat: Chronic sinus problems/congestion Immunizations Pneumococcal Vaccine: Received Pneumococcal Vaccination: Yes Received Pneumococcal Vaccination On or After 60th Birthday: Yes Implantable Devices None Hospitalization / Surgery History Type of Hospitalization/Surgery right femur fx ORIR right knee replacement left breast mass excision left ankle tibia repair abdominal hysterectomy adnoidectomy/tonsillectomy cholecystectomy dental implants patoid cystectomy tubal ligation [name], [name] ([id number]) [id number] Page 10 of 10 Family and Social History Cancer: Yes - Father,Paternal Grandparents; Diabetes: No; Heart Disease: Yes - Mother,Father,Paternal Grandparents; Hereditary Spherocytosis: No; Hypertension: Yes - Mother; Kidney Disease: No; Lung Disease: No; Seizures: No; Stroke: Yes - Maternal Grandparents; Thyroid Problems: No; Tuberculosis: No; Never smoker; Marital Status - Widowed; Alcohol Use: Rarely; Drug Use: Prior History - TCH; Caffeine Use: Daily - soda, tea; Financial Concerns: No; Food, Clothing or Shelter Needs: No; Support System Lacking: No; Transportation Concerns: No Electronic Signature(s) Signed: 05/02/2023 4:02:03 PM By: Duanne Guess MD FACS Entered By: Duanne Guess on 05/02/2023  10:51:30 -------------------------------------------------------------------------------- SuperBill Details Patient Name: Date of Service: [name] 05/02/2023 Medical Record Number: [id number] Patient Account Number: [id number] Date of Birth/Sex: Treating RN: 08/01/1951 (72 y.o. 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 Follow-up in 2 weeks. Electronic Signature(s) Signed: 05/02/2023 1:57:02 PM By: Duanne Guess MD FACS Previous Signature: 05/02/2023 1:54:12 PM Version By: Duanne Guess MD FACS Entered By: Duanne Guess on 05/02/2023 10:57:02 -------------------------------------------------------------------------------- HxROS Details Patient Name: Date of Service: Marisa Roux Cooper. 05/02/2023 12:30 PM Medical Record Number: [id number] Patient Account Number: [id number] Date of Birth/Sex: Treating RN: 26-May-1951 (72 y.o. F) Primary Care Provider: Saralyn Pilar Other Clinician: Referring Provider: Treating Provider/Extender: Park Liter in Treatment: 33 Information Obtained From Patient Caregiver Chart Constitutional Symptoms (General Health) Medical History: Past Medical History Notes: morbid obesity Eyes Medical History: Negative for: Cataracts; Glaucoma; Optic Neuritis Ear/Nose/Mouth/Throat Medical History: Positive for: Chronic sinus problems/congestion - chronic rhinitis Negative for: Middle ear problems Respiratory Medical History: Positive for: Sleep Apnea - uses CPAP Cardiovascular Medical History: Positive for: Congestive Heart Failure Past Medical History Notes: hyperlipidemia [name], [name] ([id number]) [id number] Page 9 of 10 Gastrointestinal Medical History: Past Medical History Notes: GERD, IBS, eosinophilic esophagitis, diverticulosis, Endocrine Medical History: Negative for: Type I Diabetes; Type II Diabetes Genitourinary Medical History: Negative for: End Stage Renal Disease Past Medical History Notes: CKD stage 3 Immunological Medical History: Positive for: Raynauds Negative for: Lupus Erythematosus;  Scleroderma Past Medical History Notes: Sjoegren syndrome, fibromyalgia Integumentary (Skin) Medical History: Negative for: History of Burn Musculoskeletal Medical History: Positive for: Rheumatoid Arthritis Negative for: Gout; Osteoarthritis; Osteomyelitis Neurologic Medical History: Positive for: Neuropathy Negative for: Dementia; Quadriplegia; Paraplegia; Seizure Disorder Oncologic Medical History: Negative for: Received Chemotherapy; Received Radiation Psychiatric Medical History: Negative for: Anorexia/bulimia; Confinement Anxiety HBO Extended History Items Ear/Nose/Mouth/Throat: Chronic sinus problems/congestion Immunizations Pneumococcal Vaccine: Received Pneumococcal Vaccination: Yes Received Pneumococcal Vaccination On or After 60th Birthday: Yes Implantable Devices None Hospitalization / Surgery History Type of Hospitalization/Surgery right femur fx ORIR right knee replacement left breast mass excision left ankle tibia repair abdominal hysterectomy adnoidectomy/tonsillectomy cholecystectomy dental implants patoid cystectomy tubal ligation [name], [name] ([id number]) [id number] Page 10 of 10 Family and Social History Cancer: Yes - Father,Paternal Grandparents; Diabetes: No; Heart Disease: Yes - Mother,Father,Paternal Grandparents; Hereditary Spherocytosis: No; Hypertension: Yes - Mother; Kidney Disease: No; Lung Disease: No; Seizures: No; Stroke: Yes - Maternal Grandparents; Thyroid Problems: No; Tuberculosis: No; Never smoker; Marital Status - Widowed; Alcohol Use: Rarely; Drug Use: Prior History - TCH; Caffeine Use: Daily - soda, tea; Financial Concerns: No; Food, Clothing or Shelter Needs: No; Support System Lacking: No; Transportation Concerns: No Electronic Signature(s) Signed: 05/02/2023 4:02:03 PM By: Duanne Guess MD FACS Entered By: Duanne Guess on 05/02/2023  10:51:30 -------------------------------------------------------------------------------- SuperBill Details Patient Name: Date of Service: [name] 05/02/2023 Medical Record Number: [id number] Patient Account Number: [id number] Date of Birth/Sex: Treating RN: 08/01/1951 (72 y.o. 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 Negative Presssure Wound Therapy Wound #1 Right  Gluteus Wound Vac to wound continuously at 116mm/hg pressure Black Foam Off-Loading Heel suspension boot to: - globoped right foot to ambulate Low air-loss mattress (Group 2) - Adapt Turn and reposition every 2 hours - avoid lying on back, stand at least every hour while out of bed, float heels off bed with pillows under calves while in bed Prevalon Boot - right foot esp. while in bed. Additional Orders / Instructions Follow Nutritious Diet - increase protein to 70-80 gms per day, hold ozempic Juven Shake 1-2 times daily. Home Health New wound care orders this week; continue Home Health for wound care. May utilize formulary equivalent dressing for wound treatment orders unless otherwise specified. Other Home Health Orders/Instructions: - [facility]ound [facility] Wound #1 - Gluteus Wound Laterality: Right Cleanser: Wound Cleanser 3 x Per Week/30 Days Discharge Instructions: Cleanse the wound with wound cleanser prior to applying a clean dressing using gauze sponges, not tissue or cotton balls. Peri-Wound Care: Nystop Powder (Nystatin) 3 x Per Week/30 Days Discharge Instructions: Apply Nystop (Nystatin) Powder as needed to irritated skin Prim Dressing: Promogran Prisma Matrix, 4.34 (sq in) (silver collagen) 3 x Per Week/30 Days ary Discharge Instructions: Moisten collagen with saline or hydrogel GRAVIELA, [name] ([id number]) [id number] Page 4 of 10 Prim Dressing: Maxorb Extra Calcium Alginate, 2x2 (in/in) 3 x Per Week/30 Days ary Discharge Instructions: Apply to irritated periwound under drape as needed for maceration Prim Dressing: VAC ary 3 x Per Week/30 Days Patient Medications llergies: Sulfa (Sulfonamide Antibiotics), Cymbalta, Demerol, Iodinated Contrast Media, morphine, sulfasalazine, adhesive A Notifications Medication Indication Start End 05/02/2023 Antifungal (miconazole) DOSE topical 2 % powder - Apply to skin around gluteal ulcer with dressing  changes Electronic Signature(s) Signed: 05/02/2023 4:02:03 PM By: Duanne Guess MD FACS Previous Signature: 05/02/2023 1:56:12 PM Version By: Duanne Guess MD FACS Entered By: Duanne Guess on 05/02/2023 10:56:27 -------------------------------------------------------------------------------- Problem List Details Patient Name: Date of Service: [name]. 05/02/2023 12:30 PM Medical Record Number: [id number] Patient Account Number: [id number] Date of Birth/Sex: Treating RN: 02-24-1951 (72 y.o. 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) 09/07/2022 No Yes E66.9 Obesity, unspecified 09/07/2022 No Yes Inactive Problems ICD-10 Code Description Active Date Inactive Date L89.313 Pressure ulcer of right buttock, stage 3 09/07/2022 09/07/2022 NEILY, GABLER ([id number]) [id number] Page 5 of 10 Resolved Problems ICD-10 Code Description Active Date Resolved Date L89.614 Pressure ulcer of right heel, stage 4 09/28/2022 09/28/2022 Electronic Signature(s) Signed: 05/02/2023 1:47:58 PM By: Duanne Guess MD FACS Entered By: Duanne Guess on 05/02/2023 10:47:58 -------------------------------------------------------------------------------- Progress Note Details Patient Name: Date of Service: [name]. 05/02/2023 12:30 PM Medical Record Number: [id number] Patient Account Number: [id number] Date of Birth/Sex: Treating RN: Jan 11, 1951 (72 y.o.  F) Primary Care Provider: Saralyn Pilar Other Clinician: Referring Provider: Treating Provider/Extender: Park Liter in Treatment: 84 Subjective Chief Complaint Information obtained from Patient Patient is at the clinic for treatment of open pressure ulcers History of Present Illness (HPI) ADMISSION 09/07/2023 This is a 72 year old

## 2023-05-02 NOTE — Progress Notes (Signed)
Birth/Sex: Treating RN: May 31, 1951 (72 y.o. [name] Primary Care Lyndon Chapel: Saralyn Pilar Other Clinician: Referring Analy Bassford: Treating Ellajane Stong/Extender: [name] Weeks in Treatment: 33 NPWT Maintenance Performed for: Wound #1 Right Gluteus Additional Injuries Covered: No Performed By:  Zenaida Deed, RN Type: VAC System Coverage Size (sq cm): 11.4 Pressure Type: Constant Pressure Setting: 125 mmHG Drain Type: None Primary Contact: Other : silver collagen Sponge/Dressing Type: Foam- Black Date Initiated: 10/16/2022 Dressing Removed: No Quantity of Sponges/Gauze Removed: dressing removed by home health Canister Changed: Yes Dressing Reapplied: Yes Quantity of Sponges/Gauze Inserted: 1 Respones T Treatment: o good Days On NPWT : 199 Post Procedure Diagnosis Same as Pre-procedure Electronic Signature(s) Signed: 05/02/2023 5:08:04 PM By: Zenaida Deed RN, BSN Entered By: Zenaida Deed on 05/02/2023 10:12:38 -------------------------------------------------------------------------------- Pain Assessment Details Patient Name: Date of Service: [name] 05/02/2023 12:30 PM Medical Record Number: [id number] Patient Account Number: [id number] Date of Birth/Sex: Treating RN: [birth year] (72 y.o. [name] Primary Care Akanksha Bellmore: Saralyn Pilar Other Clinician: Referring Garlin Batdorf: Treating Brooksie Ellwanger/Extender: Park Liter in Treatment: 72 Active Problems Location of Pain Severity and Description of Pain Patient Has Paino Yes Site Locations Pain Location: SHAYNEE, GINGERY ([id number]) [phone].pdf Page 6 of 9 Pain Location: Generalized Pain With Dressing Change: Yes Duration of the Pain. Constant / Intermittento Intermittent Rate the pain. Current Pain Level: 2 Worst Pain Level: 3 Character of Pain Describe the Pain: Aching Pain Management and Medication Current Pain Management: Medication: Yes Is the Current Pain Management Adequate: Adequate How does your wound impact your activities of daily livingo Sleep: No Bathing: No Appetite: No Relationship With Others: No Bladder Continence: No Emotions: No Bowel Continence: No Work: No Toileting: No Drive: No Dressing: No Hobbies:  No Electronic Signature(s) Signed: 05/02/2023 5:08:04 PM By: Zenaida Deed RN, BSN Entered By: Zenaida Deed on 05/02/2023 09:53:10 -------------------------------------------------------------------------------- Patient/Caregiver Education Details Patient Name: Date of Service: [name] 9/18/2024andnbsp12:30 PM Medical Record Number: [id number] Patient Account Number: [id number] Date of Birth/Gender: Treating RN: 1951/05/12 (72 y.o. [name] Primary Care Physician: Saralyn Pilar Other Clinician: Referring Physician: Treating Physician/Extender: Park Liter in Treatment: 66 Education Assessment Education Provided To: Patient Education Topics Provided Pressure: Methods: Explain/Verbal Responses: Reinforcements needed, State content correctly Wound/Skin Impairment: Methods: Explain/Verbal Responses: Reinforcements needed, State content correctly Electronic Signature(s) Signed: 05/02/2023 5:08:04 PM By: Zenaida Deed RN, BSN [employer], Tawny Asal ([id number]) [id number] Page 7 of 9 Entered By: Zenaida Deed on 05/02/2023 10:04:15 -------------------------------------------------------------------------------- Wound Assessment Details Patient Name: Date of Service: [name] 05/02/2023 12:30 PM Medical Record Number: [id number] Patient Account Number: [id number] Date of Birth/Sex: Treating RN: 1951/04/10 (72 y.o. 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, Raynauds, Rheumatoid  Arthritis, Neuropathy Clustered Wound: No Photos Wound Measurements Length: (cm) 3 Width: (cm) 3.8 Depth: (cm) 7.3 Area: (cm) 8.954 Volume: (cm) 65.361 % Reduction in Area: 59.3% % Reduction in Volume: 50.5% Epithelialization: Small (1-33%) Tunneling: No Undermining: No Wound Description Classification: Category/Stage IV Wound Margin: Well defined, not attached Exudate Amount: Medium Exudate Type: Serosanguineous Exudate Color: red, brown Foul Odor After Cleansing: No Slough/Fibrino Yes Wound Bed Granulation Amount: Large (67-100%) Exposed Structure  Granulation Quality: Red Fascia Exposed: No Necrotic Amount: None Present (0%) Fat Layer (Subcutaneous Tissue) Exposed: Yes Tendon Exposed: No Muscle Exposed: No Joint Exposed: No Bone Exposed: No Periwound Skin Texture Texture Color No Abnormalities Noted: No No Abnormalities Noted: Yes Callus: No Temperature / Pain Crepitus: No Temperature: No Abnormality Excoriation: No Tenderness on Palpation: Yes Induration: No Rash: Yes Scarring: No Moisture [name] ([id number]) [id number] Page 8 of 9 No Abnormalities Noted: Yes Treatment Notes Wound #1 (Gluteus) Wound Laterality: Right Cleanser Wound Cleanser Discharge Instruction: Cleanse the wound with wound cleanser prior to applying a clean dressing using gauze sponges, not tissue or cotton balls. Peri-Wound Care Nystop Powder (Nystatin) Discharge Instruction: Apply Nystop (Nystatin) Powder as needed to irritated skin Topical Primary Dressing Promogran Prisma Matrix, 4.34 (sq in) (silver collagen) Discharge Instruction: Moisten collagen with saline or hydrogel Maxorb Extra Calcium Alginate, 2x2 (in/in) Discharge Instruction: Apply to irritated periwound under drape as needed for maceration VAC Secondary Dressing Secured With Compression Wrap Compression Stockings Add-Ons Electronic Signature(s) Signed: 05/02/2023 5:08:04 PM By:  Zenaida Deed RN, BSN Entered By: Zenaida Deed on 05/02/2023 10:02:55 -------------------------------------------------------------------------------- Wound Assessment Details Patient Name: Date of Service: [name] 05/02/2023 12:30 PM Medical Record Number: [id number] Patient Account Number: [id number] Date of Birth/Sex: Treating RN: October 27, 1950 (72 y.o. 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 Wound Healed - Epithelialized Wounding Event: Pressure Injury Status: Date Acquired: 09/21/2022 Comorbid Chronic sinus problems/congestion, Sleep Apnea, Congestive Heart Weeks Of Treatment: 30 History: Failure, Raynauds, Rheumatoid Arthritis, Neuropathy Clustered Wound: No Photos YIZEL, SELVAGGIO ([id number]) [id number] Page 9 of 9 Wound Measurements Length: (cm) Width: (cm) Depth: (cm) Area: (cm) Volume: (cm) 0 % Reduction in Area: 100% 0 % Reduction in Volume: 100% 0 Epithelialization: Large (67-100%) 0 Tunneling: No 0 Undermining: No Wound Description Classification: Category/Stage IV Exudate Amount: None Present Foul Odor After Cleansing: No Slough/Fibrino No Wound Bed Granulation Amount: None Present (0%) Exposed Structure Necrotic Amount: None Present (0%) Fascia Exposed: No Fat Layer (Subcutaneous Tissue) Exposed: No Tendon Exposed: No Muscle Exposed: No Joint Exposed: No Bone Exposed: No Periwound Skin Texture Texture Color No Abnormalities Noted: Yes No Abnormalities Noted: Yes Moisture Temperature / Pain No Abnormalities Noted: Yes Temperature: No Abnormality Tenderness on Palpation: Yes Electronic Signature(s) Signed: 05/02/2023 5:08:04 PM By: Zenaida Deed RN, BSN Entered By: Zenaida Deed  on 05/02/2023 10:03:28 -------------------------------------------------------------------------------- Vitals Details Patient Name: Date of Service: [name] 05/02/2023 12:30 PM Medical Record Number: [id number] Patient Account Number: [id number] Date of Birth/Sex: Treating RN: 08/01/1951 (72 y.o. [name] Primary Care Emmali Karow: Saralyn Pilar Other Clinician: Referring Courtney Fenlon: Treating Mililani Murthy/Extender: [name] Weeks in Treatment: 33 Vital Signs Time Taken: 12:51 Temperature (F): 97.9 Height (in): 62 Pulse (bpm): 69 Weight (lbs): 207 Respiratory Rate (breaths/min): 20 Body Mass Index (BMI): 37.9 Blood Pressure (mmHg): 152/74 Reference Range: 80 - 120 mg / dl Electronic Signature(s) Signed: 05/02/2023 5:08:04 PM By: Zenaida Deed RN, BSN Entered By: Zenaida Deed on 05/02/2023 09:52:22  Granulation Quality: Red Fascia Exposed: No Necrotic Amount: None Present (0%) Fat Layer (Subcutaneous Tissue) Exposed: Yes Tendon Exposed: No Muscle Exposed: No Joint Exposed: No Bone Exposed: No Periwound Skin Texture Texture Color No Abnormalities Noted: No No Abnormalities Noted: Yes Callus: No Temperature / Pain Crepitus: No Temperature: No Abnormality Excoriation: No Tenderness on Palpation: Yes Induration: No Rash: Yes Scarring: No Moisture [name] ([id number]) [id number] Page 8 of 9 No Abnormalities Noted: Yes Treatment Notes Wound #1 (Gluteus) Wound Laterality: Right Cleanser Wound Cleanser Discharge Instruction: Cleanse the wound with wound cleanser prior to applying a clean dressing using gauze sponges, not tissue or cotton balls. Peri-Wound Care Nystop Powder (Nystatin) Discharge Instruction: Apply Nystop (Nystatin) Powder as needed to irritated skin Topical Primary Dressing Promogran Prisma Matrix, 4.34 (sq in) (silver collagen) Discharge Instruction: Moisten collagen with saline or hydrogel Maxorb Extra Calcium Alginate, 2x2 (in/in) Discharge Instruction: Apply to irritated periwound under drape as needed for maceration VAC Secondary Dressing Secured With Compression Wrap Compression Stockings Add-Ons Electronic Signature(s) Signed: 05/02/2023 5:08:04 PM By:  Zenaida Deed RN, BSN Entered By: Zenaida Deed on 05/02/2023 10:02:55 -------------------------------------------------------------------------------- Wound Assessment Details Patient Name: Date of Service: [name] 05/02/2023 12:30 PM Medical Record Number: [id number] Patient Account Number: [id number] Date of Birth/Sex: Treating RN: October 27, 1950 (72 y.o. 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 Wound Healed - Epithelialized Wounding Event: Pressure Injury Status: Date Acquired: 09/21/2022 Comorbid Chronic sinus problems/congestion, Sleep Apnea, Congestive Heart Weeks Of Treatment: 30 History: Failure, Raynauds, Rheumatoid Arthritis, Neuropathy Clustered Wound: No Photos YIZEL, SELVAGGIO ([id number]) [id number] Page 9 of 9 Wound Measurements Length: (cm) Width: (cm) Depth: (cm) Area: (cm) Volume: (cm) 0 % Reduction in Area: 100% 0 % Reduction in Volume: 100% 0 Epithelialization: Large (67-100%) 0 Tunneling: No 0 Undermining: No Wound Description Classification: Category/Stage IV Exudate Amount: None Present Foul Odor After Cleansing: No Slough/Fibrino No Wound Bed Granulation Amount: None Present (0%) Exposed Structure Necrotic Amount: None Present (0%) Fascia Exposed: No Fat Layer (Subcutaneous Tissue) Exposed: No Tendon Exposed: No Muscle Exposed: No Joint Exposed: No Bone Exposed: No Periwound Skin Texture Texture Color No Abnormalities Noted: Yes No Abnormalities Noted: Yes Moisture Temperature / Pain No Abnormalities Noted: Yes Temperature: No Abnormality Tenderness on Palpation: Yes Electronic Signature(s) Signed: 05/02/2023 5:08:04 PM By: Zenaida Deed RN, BSN Entered By: Zenaida Deed  on 05/02/2023 10:03:28 -------------------------------------------------------------------------------- Vitals Details Patient Name: Date of Service: [name] 05/02/2023 12:30 PM Medical Record Number: [id number] Patient Account Number: [id number] Date of Birth/Sex: Treating RN: 08/01/1951 (72 y.o. [name] Primary Care Emmali Karow: Saralyn Pilar Other Clinician: Referring Courtney Fenlon: Treating Mililani Murthy/Extender: [name] Weeks in Treatment: 33 Vital Signs Time Taken: 12:51 Temperature (F): 97.9 Height (in): 62 Pulse (bpm): 69 Weight (lbs): 207 Respiratory Rate (breaths/min): 20 Body Mass Index (BMI): 37.9 Blood Pressure (mmHg): 152/74 Reference Range: 80 - 120 mg / dl Electronic Signature(s) Signed: 05/02/2023 5:08:04 PM By: Zenaida Deed RN, BSN Entered By: Zenaida Deed on 05/02/2023 09:52:22  Granulation Quality: Red Fascia Exposed: No Necrotic Amount: None Present (0%) Fat Layer (Subcutaneous Tissue) Exposed: Yes Tendon Exposed: No Muscle Exposed: No Joint Exposed: No Bone Exposed: No Periwound Skin Texture Texture Color No Abnormalities Noted: No No Abnormalities Noted: Yes Callus: No Temperature / Pain Crepitus: No Temperature: No Abnormality Excoriation: No Tenderness on Palpation: Yes Induration: No Rash: Yes Scarring: No Moisture [name] ([id number]) [id number] Page 8 of 9 No Abnormalities Noted: Yes Treatment Notes Wound #1 (Gluteus) Wound Laterality: Right Cleanser Wound Cleanser Discharge Instruction: Cleanse the wound with wound cleanser prior to applying a clean dressing using gauze sponges, not tissue or cotton balls. Peri-Wound Care Nystop Powder (Nystatin) Discharge Instruction: Apply Nystop (Nystatin) Powder as needed to irritated skin Topical Primary Dressing Promogran Prisma Matrix, 4.34 (sq in) (silver collagen) Discharge Instruction: Moisten collagen with saline or hydrogel Maxorb Extra Calcium Alginate, 2x2 (in/in) Discharge Instruction: Apply to irritated periwound under drape as needed for maceration VAC Secondary Dressing Secured With Compression Wrap Compression Stockings Add-Ons Electronic Signature(s) Signed: 05/02/2023 5:08:04 PM By:  Zenaida Deed RN, BSN Entered By: Zenaida Deed on 05/02/2023 10:02:55 -------------------------------------------------------------------------------- Wound Assessment Details Patient Name: Date of Service: [name] 05/02/2023 12:30 PM Medical Record Number: [id number] Patient Account Number: [id number] Date of Birth/Sex: Treating RN: October 27, 1950 (72 y.o. 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 Wound Healed - Epithelialized Wounding Event: Pressure Injury Status: Date Acquired: 09/21/2022 Comorbid Chronic sinus problems/congestion, Sleep Apnea, Congestive Heart Weeks Of Treatment: 30 History: Failure, Raynauds, Rheumatoid Arthritis, Neuropathy Clustered Wound: No Photos YIZEL, SELVAGGIO ([id number]) [id number] Page 9 of 9 Wound Measurements Length: (cm) Width: (cm) Depth: (cm) Area: (cm) Volume: (cm) 0 % Reduction in Area: 100% 0 % Reduction in Volume: 100% 0 Epithelialization: Large (67-100%) 0 Tunneling: No 0 Undermining: No Wound Description Classification: Category/Stage IV Exudate Amount: None Present Foul Odor After Cleansing: No Slough/Fibrino No Wound Bed Granulation Amount: None Present (0%) Exposed Structure Necrotic Amount: None Present (0%) Fascia Exposed: No Fat Layer (Subcutaneous Tissue) Exposed: No Tendon Exposed: No Muscle Exposed: No Joint Exposed: No Bone Exposed: No Periwound Skin Texture Texture Color No Abnormalities Noted: Yes No Abnormalities Noted: Yes Moisture Temperature / Pain No Abnormalities Noted: Yes Temperature: No Abnormality Tenderness on Palpation: Yes Electronic Signature(s) Signed: 05/02/2023 5:08:04 PM By: Zenaida Deed RN, BSN Entered By: Zenaida Deed  on 05/02/2023 10:03:28 -------------------------------------------------------------------------------- Vitals Details Patient Name: Date of Service: [name] 05/02/2023 12:30 PM Medical Record Number: [id number] Patient Account Number: [id number] Date of Birth/Sex: Treating RN: 08/01/1951 (72 y.o. [name] Primary Care Emmali Karow: Saralyn Pilar Other Clinician: Referring Courtney Fenlon: Treating Mililani Murthy/Extender: [name] Weeks in Treatment: 33 Vital Signs Time Taken: 12:51 Temperature (F): 97.9 Height (in): 62 Pulse (bpm): 69 Weight (lbs): 207 Respiratory Rate (breaths/min): 20 Body Mass Index (BMI): 37.9 Blood Pressure (mmHg): 152/74 Reference Range: 80 - 120 mg / dl Electronic Signature(s) Signed: 05/02/2023 5:08:04 PM By: Zenaida Deed RN, BSN Entered By: Zenaida Deed on 05/02/2023 09:52:22

## 2023-05-04 DIAGNOSIS — L89313 Pressure ulcer of right buttock, stage 3: Secondary | ICD-10-CM | POA: Diagnosis not present

## 2023-05-04 DIAGNOSIS — R7302 Impaired glucose tolerance (oral): Secondary | ICD-10-CM | POA: Diagnosis not present

## 2023-05-04 DIAGNOSIS — I5032 Chronic diastolic (congestive) heart failure: Secondary | ICD-10-CM | POA: Diagnosis not present

## 2023-05-04 DIAGNOSIS — N1832 Chronic kidney disease, stage 3b: Secondary | ICD-10-CM | POA: Diagnosis not present

## 2023-05-04 DIAGNOSIS — L89314 Pressure ulcer of right buttock, stage 4: Secondary | ICD-10-CM | POA: Diagnosis not present

## 2023-05-09 ENCOUNTER — Other Ambulatory Visit: Payer: Self-pay | Admitting: Family Medicine

## 2023-05-09 ENCOUNTER — Telehealth: Payer: Self-pay

## 2023-05-09 ENCOUNTER — Encounter: Payer: Self-pay | Admitting: Family Medicine

## 2023-05-09 DIAGNOSIS — M79604 Pain in right leg: Secondary | ICD-10-CM

## 2023-05-09 DIAGNOSIS — S7291XD Unspecified fracture of right femur, subsequent encounter for closed fracture with routine healing: Secondary | ICD-10-CM

## 2023-05-09 NOTE — Telephone Encounter (Signed)
I have reached out to Dr. Lady Gary to see if she has any suggestions.  I am not able to refill oxycodone at this time, but I would recommend staying on a schedule of Tylenol 650 mg every 6 hours to start.  She can reach back out and I can adjust this if it does not offer any relief.

## 2023-05-09 NOTE — Telephone Encounter (Signed)
Received a message from Dr. Lady Gary, they are practice policy limits her ability to prescribe any pain medication.  I recommend Tylenol 650 mg every 6 hours if she needs daily pain management.  The oxycodone from primary care should be used only during wound care.  If she does need more than that, I can refer her to pain management.

## 2023-05-09 NOTE — Telephone Encounter (Signed)
Pt is requesting a refill for Oxycodone due to still having a lot of pain in the wound. Pt still the area is getting better but it hurts even when the care team is not working on the wound.   Pt states if you can give her Oxycodone please consider something for pain.

## 2023-05-09 NOTE — Telephone Encounter (Signed)
Sent patient a MyChart message with her options as documented below:   On 04/03/2023, I sent in a prescription for oxycodone with the same instructions that Herbert Seta previously gave her:   I went ahead and sent new prescription for oxycodone 5 mg. The prescription says for three times daily as needed, however, I would like for you to take this only for dressing changes and severe pain. I was able to send #30 tablets to [facility] for you. Let me know if you have any questions.      She has wound care every 2 weeks.  Spoke to [place] on the phone, Delice Bison informed her that we can provide oxycodone prescription to use only for dressing changes.  If she is needing medication for daily use to manage pain, recommend starting with acetaminophen 650 mg 4 times daily.  If this is not enough to control the pain, it can be increased to a maximum of 1000 mg 4 times daily with monitoring of hepatic function periodically.  If she needs something stronger, I can refer her to pain management.   Sent MyChart message today, 05/09/2023, informing patient of this.

## 2023-05-09 NOTE — Progress Notes (Signed)
On 04/03/2023, I sent in a prescription for oxycodone with the same instructions that Herbert Seta previously gave her:   I went ahead and sent new prescription for oxycodone 5 mg. The prescription says for three times daily as needed, however, I would like for you to take this only for dressing changes and severe pain. I was able to send #30 tablets to [place] [facility] for you. Let me know if you have any questions.    She has wound care every 2 weeks.  Spoke to [place] on the phone, Delice Bison informed her that we can provide oxycodone prescription to use only for dressing changes.  If she is needing medication for daily use to manage pain, recommend starting with acetaminophen 650 mg 4 times daily.  If this is not enough to control the pain, it can be increased to a maximum of 1000 mg 4 times daily with monitoring of hepatic function periodically.  If she needs something stronger, I can refer her to pain management.  Sent MyChart message today, 05/09/2023, informing patient of this.

## 2023-05-09 NOTE — Telephone Encounter (Signed)
LVM requesting a return call.

## 2023-05-09 NOTE — Telephone Encounter (Signed)
Patient was informed of provider's response. Patient states she takes Tylenol 650 mg TID and it is not helping. Patient is requesting Oxycodone or Tramadol to relieve pain during wound care.   Patient was notified that she can take Tylenol 650 mg 4 times daily.   Pt verbalized understanding. All questions and concerns have been addressed.

## 2023-05-10 ENCOUNTER — Other Ambulatory Visit: Payer: Self-pay

## 2023-05-10 DIAGNOSIS — R5383 Other fatigue: Secondary | ICD-10-CM

## 2023-05-10 DIAGNOSIS — E782 Mixed hyperlipidemia: Secondary | ICD-10-CM

## 2023-05-10 DIAGNOSIS — R7303 Prediabetes: Secondary | ICD-10-CM

## 2023-05-10 MED ORDER — OXYCODONE HCL 5 MG PO TABS
5.0000 mg | ORAL_TABLET | Freq: Four times a day (QID) | ORAL | 0 refills | Status: DC | PRN
Start: 2023-05-10 — End: 2023-10-29

## 2023-05-14 DIAGNOSIS — I5032 Chronic diastolic (congestive) heart failure: Secondary | ICD-10-CM | POA: Diagnosis not present

## 2023-05-14 DIAGNOSIS — M6281 Muscle weakness (generalized): Secondary | ICD-10-CM | POA: Diagnosis not present

## 2023-05-14 DIAGNOSIS — R2689 Other abnormalities of gait and mobility: Secondary | ICD-10-CM | POA: Diagnosis not present

## 2023-05-14 DIAGNOSIS — I5022 Chronic systolic (congestive) heart failure: Secondary | ICD-10-CM | POA: Diagnosis not present

## 2023-05-14 DIAGNOSIS — L89313 Pressure ulcer of right buttock, stage 3: Secondary | ICD-10-CM | POA: Diagnosis not present

## 2023-05-14 DIAGNOSIS — N1832 Chronic kidney disease, stage 3b: Secondary | ICD-10-CM | POA: Diagnosis not present

## 2023-05-14 DIAGNOSIS — R7302 Impaired glucose tolerance (oral): Secondary | ICD-10-CM | POA: Diagnosis not present

## 2023-05-14 DIAGNOSIS — S72409D Unspecified fracture of lower end of unspecified femur, subsequent encounter for closed fracture with routine healing: Secondary | ICD-10-CM | POA: Diagnosis not present

## 2023-05-14 DIAGNOSIS — L89314 Pressure ulcer of right buttock, stage 4: Secondary | ICD-10-CM | POA: Diagnosis not present

## 2023-05-15 ENCOUNTER — Other Ambulatory Visit: Payer: PPO

## 2023-05-15 DIAGNOSIS — L89314 Pressure ulcer of right buttock, stage 4: Secondary | ICD-10-CM | POA: Diagnosis not present

## 2023-05-16 ENCOUNTER — Encounter (HOSPITAL_BASED_OUTPATIENT_CLINIC_OR_DEPARTMENT_OTHER): Payer: PPO | Attending: General Surgery | Admitting: General Surgery

## 2023-05-16 DIAGNOSIS — R7302 Impaired glucose tolerance (oral): Secondary | ICD-10-CM | POA: Insufficient documentation

## 2023-05-16 DIAGNOSIS — I5032 Chronic diastolic (congestive) heart failure: Secondary | ICD-10-CM | POA: Diagnosis not present

## 2023-05-16 DIAGNOSIS — G4733 Obstructive sleep apnea (adult) (pediatric): Secondary | ICD-10-CM | POA: Diagnosis not present

## 2023-05-16 DIAGNOSIS — L89314 Pressure ulcer of right buttock, stage 4: Secondary | ICD-10-CM | POA: Diagnosis not present

## 2023-05-16 DIAGNOSIS — I130 Hypertensive heart and chronic kidney disease with heart failure and stage 1 through stage 4 chronic kidney disease, or unspecified chronic kidney disease: Secondary | ICD-10-CM | POA: Insufficient documentation

## 2023-05-16 DIAGNOSIS — N1832 Chronic kidney disease, stage 3b: Secondary | ICD-10-CM | POA: Diagnosis not present

## 2023-05-16 DIAGNOSIS — I7300 Raynaud's syndrome without gangrene: Secondary | ICD-10-CM | POA: Diagnosis not present

## 2023-05-16 DIAGNOSIS — Z6837 Body mass index (BMI) 37.0-37.9, adult: Secondary | ICD-10-CM | POA: Insufficient documentation

## 2023-05-16 NOTE — Progress Notes (Deleted)
Office Visit Note  Patient: [name]             Date of Birth: 03/29/1951           MRN: [id number]             PCP: Melida Quitter, PA Referring: Carlean Jews, NP Visit Date: 05/30/2023 Occupation: @GUAROCC @  Subjective:  No chief complaint on file.   History of Present Illness: [name] is a 72 y.o. female ***     Activities of Daily Living:  Patient reports morning stiffness for *** {minute/hour:19697}.   Patient {ACTIONS;DENIES/REPORTS:21021675::"Denies"} nocturnal pain.  Difficulty dressing/grooming: {ACTIONS;DENIES/REPORTS:21021675::"Denies"} Difficulty climbing stairs: {ACTIONS;DENIES/REPORTS:21021675::"Denies"} Difficulty getting out of chair: {ACTIONS;DENIES/REPORTS:21021675::"Denies"} Difficulty using hands for taps, buttons, cutlery, and/or writing: {ACTIONS;DENIES/REPORTS:21021675::"Denies"}  No Rheumatology ROS completed.   PMFS History:  Patient Active Problem List   Diagnosis Date Noted   Situational anxiety 10/15/2022   Pressure sore of left ischial area, stage II (HCC) 09/10/2022   Pain in right leg 09/10/2022   Aftercare for healing traumatic fracture of right femur 06/21/2022   Chronic kidney disease, stage 3b (HCC) 06/21/2022   DNR (do not resuscitate) 06/21/2022   Agatston coronary artery calcium score greater than 400 05/25/2022   Laryngopharyngeal reflux (LPR) 10/10/2021   Chronic maxillary sinusitis 10/10/2021   Other fatigue 02/14/2021   Body mass index (BMI) of 36.0-36.9 in adult 02/14/2021   Prediabetes 02/14/2021   Abnormal kidney function 08/01/2020   Muscle spasm of back 08/01/2020   Microalbuminuria 08/01/2020   Post-menopausal 09/02/2019   Pain in left knee 08/23/2018   Lumbar pain 07/04/2018   Recurrent urticaria 05/20/2018   Rhinitis 05/20/2018   Vitamin D deficiency 11/20/2017   Mixed hyperlipidemia 11/20/2017   Family history of coronary arteriosclerosis- strong fam h/o CAD and early CAD.  07/17/2017    Raynaud's disease without gangrene 03/12/2017   Abnormal weight gain 02/06/2017   Shingles outbreak 02/06/2017   Cyst (solitary) of breast, left 01/31/2017   Eosinophilic esophagitis 10/12/2016   History of hyperlipidemia 10/04/2016   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   History of diverticulitis 09/03/2016   Osteoporosis 09/03/2016   Autoimmune disease (HCC) 09/02/2016   High risk medication use 09/02/2016   History of esophagitis 09/02/2016   Elevated triglycerides with high cholesterol 08/23/2016   Low serum HDL 08/23/2016   Breast cyst, left 06/14/2016   Encounter for wellness examination 05/23/2016   Abnormality of gait 05/09/2016   Sjogren's syndrome (HCC) 02/29/2016   GERD (gastroesophageal reflux disease) 01/19/2016   Glucose intolerance (impaired glucose tolerance) 01/19/2016   Chronic diastolic CHF (congestive heart failure) (HCC) 04/20/2015   Peripheral polyneuropathy 02/02/2014   Class 2 obesity due to excess calories with body mass index (BMI) of 36.0 to 36.9 in adult 11/17/2013   Heart murmur    OSA (obstructive sleep apnea)    Rheumatoid arthritis (HCC)    Hiatal hernia    Fibromyalgia    PVC's (premature ventricular contractions)    History of total knee replacement, right 06/24/2011   Unilateral primary osteoarthritis, left knee 06/22/2011    Past Medical History:  Diagnosis Date   Agatston coronary artery calcium score greater than 400 05/2022   coronary Ca score 856   Back pain    Blood transfusion    1981   Chronic diastolic CHF (congestive heart failure) (HCC)    diastolic    COVID    DDD (degenerative disc disease), cervical    DDD (degenerative disc  disease), lumbar    Fibromyalgia    GERD (gastroesophageal reflux disease)    Heart murmur    as a child   Hiatal hernia    sjorgens syndrome   High blood pressure    High cholesterol    IBS (irritable bowel syndrome)    OSA (obstructive sleep apnea)    Osteoarthritis    Peripheral  autonomic neuropathy of unknown cause    Pre-diabetes    PVC (premature ventricular contraction)    Raynaud disease    Rheumatoid arthritis (HCC)    Sjogren's disease (HCC)    Urticaria    Vitamin D deficiency     Family History  Problem Relation Age of Onset   Alzheimer's disease Mother    Heart attack Mother    Hypertension Mother    Glaucoma Mother    High Cholesterol Mother    Heart disease Mother    Depression Mother    Cancer Father    High Cholesterol Father    Heart disease Father    Sleep apnea Father    Breast cancer Maternal Aunt    Heart attack Brother    Heart disease Brother    Glaucoma Brother    Hyperlipidemia Brother    Glaucoma Brother    Asthma Son    Allergic rhinitis Neg Hx    Angioedema Neg Hx    Eczema Neg Hx    Urticaria Neg Hx    Neuropathy Neg Hx    Past Surgical History:  Procedure Laterality Date   ABDOMINAL HYSTERECTOMY     BTL, BSO   ADENOIDECTOMY     BREAST EXCISIONAL BIOPSY Left    BREAST SURGERY     mass removal    CHOLECYSTECTOMY     dental implants     DIAGNOSTIC LAPAROSCOPY     x3   FEMUR FRACTURE SURGERY Right    KNEE ARTHROSCOPY     x2   MASS EXCISION Left 03/22/2017   Procedure: EXCISION OF LEFT BREAST MASS;  Surgeon: Manus Rudd, MD;  Location: [facility];  Service: General;  Laterality: Left;   ORIF FEMUR FRACTURE Right 06/22/2022   Procedure: RIGHT OPEN REDUCTION INTERNAL FIXATION (ORIF) DISTAL FEMUR FRACTURE;  Surgeon: Myrene Galas, MD;  Location: [facility];  Service: Orthopedics;  Laterality: Right;   patotid cystectomy     RIGHT/LEFT HEART CATH AND CORONARY ANGIOGRAPHY N/A 07/25/2019   Procedure: RIGHT/LEFT HEART CATH AND CORONARY ANGIOGRAPHY;  Surgeon: Dolores Patty, MD;  Location: [facility];  Service: Cardiovascular;  Laterality: N/A;   TENDON REPAIR  1980   left ankle and tibia   TONSILLECTOMY     TOTAL KNEE ARTHROPLASTY  06/21/2011   Procedure: TOTAL KNEE ARTHROPLASTY;  Surgeon: Loanne Drilling;   Location: [facility];  Service: Orthopedics;  Laterality: Right;   TUBAL LIGATION     Social History   Social History Narrative   Lives at home alone   Right-handed   Drinks 1 or less cups of coffee and 2 servings of either tea or soda per day   Immunization History  Administered Date(s) Administered   Fluad Quad(high Dose 65+) 05/05/2021   Influenza, High Dose Seasonal PF 06/12/2018, 05/17/2019   Influenza,inj,Quad PF,6+ Mos 05/23/2016, 06/01/2017   Influenza-Unspecified 06/05/2018, 05/17/2019, 06/07/2020, 05/18/2022   PFIZER(Purple Top)SARS-COV-2 Vaccination 09/03/2019, 09/24/2019, 04/01/2020, 10/04/2020   Pfizer Covid-19 Vaccine Bivalent Booster 62yrs & up 05/05/2021, 05/18/2022   Pneumococcal Conjugate-13 07/02/2017   Pneumococcal Polysaccharide-23 05/17/2005   Respiratory Syncytial Virus  Vaccine,Recomb Aduvanted(Arexvy) 05/18/2022   Td 10/16/2007   Tdap 03/21/2018   Zoster Recombinant(Shingrix) 06/10/2019, 12/11/2019     Objective: Vital Signs: There were no vitals taken for this visit.   Physical Exam   Musculoskeletal Exam: ***  CDAI Exam: CDAI Score: -- Patient Global: --; Provider Global: -- Swollen: --; Tender: -- Joint Exam 05/30/2023   No joint exam has been documented for this visit   There is currently no information documented on the homunculus. Go to the Rheumatology activity and complete the homunculus joint exam.  Investigation: No additional findings.  Imaging: No results found.  Recent Labs: Lab Results  Component Value Date   WBC 6.7 11/28/2022   HGB 10.4 (A) 03/14/2023   PLT 314 11/28/2022   NA 139 03/14/2023   K 3.9 03/14/2023   CL 107 03/14/2023   CO2 23 (A) 03/14/2023   GLUCOSE 109 11/28/2022   BUN 17 03/14/2023   CREATININE 1.0 03/14/2023   BILITOT 0.3 11/28/2022   ALKPHOS 45 06/23/2022   AST 14 11/28/2022   ALT 6 11/28/2022   PROT 5.7 (L) 11/28/2022   ALBUMIN 3.8 03/14/2023   CALCIUM 9.1 03/14/2023   GFRAA 58 (L) 09/23/2020     Speciality Comments: PLQ Eye Exam: 03/05/2023 [facility] f/u in 1 year   Procedures:  No procedures performed Allergies: Sulfa antibiotics, Cymbalta [duloxetine hcl], Demerol, Ivp dye [iodinated contrast media], Morphine and codeine, Sulfasalazine, and Adhesive [tape]   Assessment / Plan:     Visit Diagnoses: Rheumatoid arthritis involving multiple sites with positive rheumatoid factor (HCC)  High risk medication use  Sicca syndrome (HCC)  Raynaud's disease without gangrene  History of total knee replacement, right  Unilateral primary osteoarthritis, left knee  Degeneration of intervertebral disc of lumbar region without discogenic back pain or lower extremity pain  Pressure injury of right buttock, stage 4 (HCC)  Pressure injury of deep tissue of right heel  Fibromyalgia  Chronic SI joint pain  Age-related osteoporosis without current pathological fracture  Status post fracture of femur  History of hyperlipidemia  History of cardiac murmur  History of diabetes mellitus  History of peripheral neuropathy  CKD (chronic kidney disease) stage 4, GFR 15-29 ml/min (HCC)  Eosinophilic esophagitis  History of diverticulitis  History of depression  Orders: No orders of the defined types were placed in this encounter.  No orders of the defined types were placed in this encounter.   Face-to-face time spent with patient was *** minutes. Greater than 50% of time was spent in counseling and coordination of care.  Follow-Up Instructions: No follow-ups on file.   Gearldine Bienenstock, PA-C  Note - This record has been created using Dragon software.  Chart creation errors have been sought, but may not always  have been located. Such creation errors do not reflect on  the standard of medical care.

## 2023-05-17 ENCOUNTER — Other Ambulatory Visit: Payer: PPO

## 2023-05-17 DIAGNOSIS — E782 Mixed hyperlipidemia: Secondary | ICD-10-CM | POA: Diagnosis not present

## 2023-05-17 DIAGNOSIS — R5383 Other fatigue: Secondary | ICD-10-CM | POA: Diagnosis not present

## 2023-05-17 DIAGNOSIS — R7303 Prediabetes: Secondary | ICD-10-CM | POA: Diagnosis not present

## 2023-05-17 NOTE — Progress Notes (Signed)
05/16/2023 09:17:55 -------------------------------------------------------------------------------- Progress Note Details Patient Name: Date of Service: [name], [name] 05/16/2023 10:30 A M Medical Record Number: [id number] Patient Account Number: [id number] Date of Birth/Sex: Treating RN: Aug 16, 1950 (72 y.o. F) Primary Care Provider: Saralyn Pilar Other Clinician: Referring Provider: Treating Provider/Extender: Park Liter in Treatment: 35 Subjective Chief Complaint Information obtained from Patient Patient is at the clinic for treatment of open pressure ulcers History of Present Illness (HPI) ADMISSION 09/07/2023 This is a 72 year old woman with a past medical history notable for obesity, congestive heart failure, Raynaud's syndrome, CKD stage IIIb, osteoporosis, and rheumatoid arthritis. In November 2023, she suffered a fall that resulted in a femur fracture. She was hospitalized for about a week and underwent surgical repair of the fracture. She subsequently developed pressure ulcers on her right buttocks and ischium. She has been receiving home health services and they have been applying Medihoney. They have been reporting the wounds as stage II, but on evaluation, the large ulcer was probably unstageable at the time they were evaluating it but it is clearly a stage IV at this point. The smaller ulcer does have fat layer exposure and therefore is a stage III. The patient is accompanied by her daughter. She says she has been sleeping on a regular bed but recently ordered an air mattress T opper, but does not have it yet. She is on Ozempic for weight loss and therefore has a poor appetite and struggles to get adequate protein intake. 09/15/2022: The large stage IV ulcer is substantially  cleaner this week. The stage III ulcer is a little smaller. Both have slough accumulation. The culture that I took last week was polymicrobial. The Augmentin that I prescribed was adequate coverage for the species and she continues to take this. We have also ordered Keystone topical antibiotic compound, but this has not yet arrived. 09/21/2022: The stage IV ulcer has some necrotic muscle at the base but is otherwise fairly clean. The stage III ulcer is smaller again with light slough on the surface. She has her Keystone topical antibiotic compound with her today. 09/29/2022: There is still some necrotic muscle at the base of the stage IV ulcer that I was unable to get to last week. The stage III ulcer has healed. She unfortunately has developed a new pressure ulcer on her right heel. 10/06/2022: Still with some devitalized muscle at the base of the stage IV ulcer, but otherwise this wound is looking quite clean. The pressure induced tissue injury on her heel is demarcating and much of the area appears to be epithelialized, but there is still 2 areas that remain questionable. 10/12/2022: The stage IV ulcer is very clean and ready for wound VAC, which will be delivered tomorrow according to the patient. The tissue injury on her heel is drying up and does not feel particularly boggy this week. 10/19/2022: The heel injury continues to improve. There is still some dry eschar present on the more plantar aspect of it. There is some slough accumulation on the surface of the stage IV pressure ulcer. The wound VAC was initiated by home health last week. 11/01/2022: The gluteal pressure ulcer is very clean without any necrotic tissue or slough. The depth has come in by over half a centimeter. The deep tissue injury on her heel continues to improve. There is still dry eschar that we are painting with Betadine as well as some fresh-looking viable tissue at the margin. [name], [name] ([id number])  [id number] Page 6 of  other debris accumulation. The heel continues to fill in granulation tissue over where the bone had been exposed. There is some slough on the wound surface. 03/07/2023: The gluteal ulcer was measured slightly deeper today, but on palpation, it feels roughly the same. The cavity is certainly  more contracted. She was complaining of more pain in the area and it appears that GranuFoam was applied directly to her skin and she has some irritation from this. The heel ulcer is filling in with excellent granulation tissue. It does not probe to bone any longer. Minimal slough accumulation. 03/21/2023: The gluteal ulcer is shallower and the cavity is tighter. She has some periwound skin breakdown that we have been trying to address by applying DuoDERM to the skin around the wound opening. The home health nurse also added nystatin powder to address some yeasty-looking areas. The heel ulcer is much shallower and smaller. Minimal slough and biofilm buildup. 04/04/2023: The gluteal ulcer continues to get shallower and narrower. The periwound skin looks much better this week. The heel ulcer has a band of epithelialized tissue separating the site into 2 openings. There is very little depth remaining and the wounds are quite clean with minimal slough on the surface. 04/18/2023: The heel ulcer is nearly completely closed. There is just 1 open very superficial area remaining underneath some eschar and slough. The gluteal ulcer still has the same depth at its deepest point, but everything is much narrower and constricted. The wound is clean without any slough. 05/02/2023: The heel ulcer is closed. She has had a recurrence of the rash on her gluteus around the wound. Her home health nurse left the wound VAC off after her visit on Monday because of this. The gluteal ulcer continues to contract and has a very clean surface. 05/16/2023: The gluteal fungal rash has worsened. The patient is complaining of significant itching and discomfort. Her home health nurse has been applying silver alginate to the periwound skin to try and protect it, but she really does not have a whole lot of drainage due to the excellent function of the wound VAC. The wound depth has come in a bit more and the orifice is narrowing  further. Electronic Signature(s) Signed: 05/16/2023 12:19:18 PM By: Duanne Guess MD FACS Entered By: Duanne Guess on 05/16/2023 09:19:18 -------------------------------------------------------------------------------- Physical Exam Details Patient Name: Date of Service: [name] 05/16/2023 10:30 A M Medical Record Number: [id number] Patient Account Number: [id number] Date of Birth/Sex: Treating RN: [birth year] (72 y.o. F) Primary Care Provider: Saralyn Pilar Other Clinician: Referring Provider: Treating Provider/Extender: [name] Weeks in Treatment: 35 Constitutional . . . . no acute distress. Respiratory Normal work of breathing on room air. [name], [name] ([id number]) [id number] Page 3 of 10 Notes 05/16/2023: The gluteal fungal rash has worsened. The patient is complaining of significant itching and discomfort. The wound depth has come in a bit more and the orifice is narrowing further. Electronic Signature(s) Signed: 05/16/2023 12:19:53 PM By: Duanne Guess MD FACS Entered By: Duanne Guess on 05/16/2023 09:19:53 -------------------------------------------------------------------------------- Physician Orders Details Patient Name: Date of Service: [name]. 05/16/2023 10:30 A M Medical Record Number: [id number] Patient Account Number: [id number] Date of Birth/Sex: Treating RN: February 22, 1951 (72 y.o. [name] Primary Care Provider: Saralyn Pilar Other Clinician: Referring Provider: Treating Provider/Extender: Park Liter in Treatment: 58 The following information was scribed by: Zenaida Deed The information was scribed for: Duanne Guess Verbal / Phone Orders: No Diagnosis Coding ICD-10  Coding Code Description L89.314 Pressure ulcer of right buttock, stage 4 I73.00 Raynaud's syndrome without gangrene I50.32 Chronic diastolic (congestive) heart  failure N18.32 Chronic kidney disease, stage 3b R73.02 Impaired glucose tolerance (oral) E66.9 Obesity, unspecified Follow-up Appointments ppointment in 2 weeks. - Dr Lady Gary Rm 1 Return A Wed 10/16 @ 11:15 am Anesthetic Wound #1 Right Gluteus (In clinic) Topical Lidocaine 4% applied to wound bed Bathing/ Shower/ Hygiene May shower and wash wound with soap and water. Negative Presssure Wound Therapy Wound #1 Right Gluteus Wound Vac to wound continuously at 146mm/hg pressure Black Foam Off-Loading Heel suspension boot to: - globoped right foot to ambulate Low air-loss mattress (Group 2) - Adapt Turn and reposition every 2 hours - avoid lying on back, stand at least every hour while out of bed, float heels off bed with pillows under calves while in bed Prevalon Boot - right foot esp. while in bed. Additional Orders / Instructions Follow Nutritious Diet - increase protein to 70-80 gms per day, hold ozempic Juven Shake 1-2 times daily. Home Health New wound care orders this week; continue Home Health for wound care. May utilize formulary equivalent dressing for wound treatment orders unless otherwise specified. Other Home Health Orders/Instructions: - [facility] Wound Treatment Wound #1 - Gluteus Wound Laterality: Right Cleanser: Wound Cleanser 3 x Per Week/30 Days Discharge Instructions: Cleanse the wound with wound cleanser prior to applying a clean dressing using gauze sponges, not tissue or cotton balls. JOAN, HERSCHBERGER ([id number]) [id number] Page 4 of 10 Peri-Wound Care: Skin Prep 3 x Per Week/30 Days Discharge Instructions: Use skin prep as directed Peri-Wound Care: Nystop Powder (Nystatin) 3 x Per Week/30 Days Discharge Instructions: Apply Nystop (Nystatin) Powder as needed to irritated skin Prim Dressing: Promogran Prisma Matrix, 4.34 (sq in) (silver collagen) 3 x Per Week/30 Days ary Discharge Instructions: Moisten collagen with saline or  hydrogel Prim Dressing: Maxorb Extra Calcium Alginate, 2x2 (in/in) 3 x Per Week/30 Days ary Discharge Instructions: Apply to irritated periwound under drape as needed for maceration Prim Dressing: VAC ary 3 x Per Week/30 Days Patient Medications llergies: Sulfa (Sulfonamide Antibiotics), Cymbalta, Demerol, Iodinated Contrast Media, morphine, sulfasalazine, adhesive A Notifications Medication Indication Start End 05/16/2023 fluconazole DOSE oral 200 mg tablet - 1 tab p.o. weekly x 4 weeks Electronic Signature(s) Signed: 05/16/2023 4:32:07 PM By: Duanne Guess MD FACS Previous Signature: 05/16/2023 12:16:27 PM Version By: Duanne Guess MD FACS Entered By: Duanne Guess on 05/16/2023 09:29:32 -------------------------------------------------------------------------------- Problem List Details Patient Name: Date of Service: [name]. 05/16/2023 10:30 A M Medical Record Number: [id number] Patient Account Number: [id number] Date of Birth/Sex: Treating RN: [birth year] (72 y.o. [name] Primary Care Provider: Saralyn Pilar Other Clinician: Referring Provider: Treating Provider/Extender: [name] Weeks in Treatment: 63 Active Problems ICD-10 Encounter Code Description Active Date MDM Diagnosis L89.314 Pressure ulcer of right buttock, stage 4 09/07/2022 No Yes I73.00 Raynaud's syndrome without gangrene 09/07/2022 No Yes I50.32 Chronic diastolic (congestive) heart failure 09/07/2022 No Yes N18.32 Chronic kidney disease, stage 3b 09/07/2022 No Yes R73.02 Impaired glucose tolerance (oral) 09/07/2022 No Yes E66.9 Obesity, unspecified 09/07/2022 No Yes [name], [name] ([id number]) [id number] Page 5 of 10 Inactive Problems ICD-10 Code Description Active Date Inactive Date L89.313 Pressure ulcer of right buttock, stage 3 09/07/2022 09/07/2022 Resolved Problems ICD-10 Code Description Active Date Resolved Date L89.614  Pressure ulcer of right heel, stage 4 09/28/2022 09/28/2022 Electronic Signature(s) Signed: 05/16/2023 12:17:55 PM By: Duanne Guess MD FACS Entered By: Duanne Guess on  [name], [name] ([id number]) [id number] Page 1 of 10 Visit Report for 05/16/2023 Chief Complaint Document Details Patient Name: Date of Service: [name], [name] 05/16/2023 10:30 A M Medical Record Number: [id number] Patient Account Number: [id number] Date of Birth/Sex: Treating RN: 12/24/1950 (72 y.o. F) Primary Care Provider: Saralyn Pilar Other Clinician: Referring Provider: Treating Provider/Extender: Park Liter in Treatment: 35 Information Obtained from: Patient Chief Complaint Patient is at the clinic for treatment of open pressure ulcers Electronic Signature(s) Signed: 05/16/2023 12:18:08 PM By: Duanne Guess MD FACS Entered By: Duanne Guess on 05/16/2023 09:18:08 -------------------------------------------------------------------------------- HPI Details Patient Name: Date of Service: Marisa Roux E. 05/16/2023 10:30 A M Medical Record Number: [id number] Patient Account Number: [id number] Date of Birth/Sex: Treating RN: [birth year] (72 y.o. F) Primary Care Provider: Saralyn Pilar Other Clinician: Referring Provider: Treating Provider/Extender: Park Liter in Treatment: 35 History of Present Illness HPI Description: ADMISSION 09/07/2023 This is a 72 year old woman with a past medical history notable for obesity, congestive heart failure, Raynaud's syndrome, CKD stage IIIb, osteoporosis, and rheumatoid arthritis. In November 2023, she suffered a fall that resulted in a femur fracture. She was hospitalized for about a week and underwent surgical repair of the fracture. She subsequently developed pressure ulcers on her right buttocks and ischium. She has been receiving home health services and they have been applying Medihoney. They have been reporting the wounds as stage II, but on evaluation, the large ulcer was probably unstageable at the time they were evaluating it but it is clearly a  stage IV at this point. The smaller ulcer does have fat layer exposure and therefore is a stage III. The patient is accompanied by her daughter. She says she has been sleeping on a regular bed but recently ordered an air mattress T opper, but does not have it yet. She is on Ozempic for weight loss and therefore has a poor appetite and struggles to get adequate protein intake. 09/15/2022: The large stage IV ulcer is substantially cleaner this week. The stage III ulcer is a little smaller. Both have slough accumulation. The culture that I took last week was polymicrobial. The Augmentin that I prescribed was adequate coverage for the species and she continues to take this. We have also ordered Keystone topical antibiotic compound, but this has not yet arrived. 09/21/2022: The stage IV ulcer has some necrotic muscle at the base but is otherwise fairly clean. The stage III ulcer is smaller again with light slough on the surface. She has her Keystone topical antibiotic compound with her today. 09/29/2022: There is still some necrotic muscle at the base of the stage IV ulcer that I was unable to get to last week. The stage III ulcer has healed. She unfortunately has developed a new pressure ulcer on her right heel. 10/06/2022: Still with some devitalized muscle at the base of the stage IV ulcer, but otherwise this wound is looking quite clean. The pressure induced tissue injury on her heel is demarcating and much of the area appears to be epithelialized, but there is still 2 areas that remain questionable. 10/12/2022: The stage IV ulcer is very clean and ready for wound VAC, which will be delivered tomorrow according to the patient. The tissue injury on her heel is drying up and does not feel particularly boggy this week. 10/19/2022: The heel injury continues to improve. There is still some dry eschar present on the more plantar aspect of it. There is some slough accumulation on the  other debris accumulation. The heel continues to fill in granulation tissue over where the bone had been exposed. There is some slough on the wound surface. 03/07/2023: The gluteal ulcer was measured slightly deeper today, but on palpation, it feels roughly the same. The cavity is certainly  more contracted. She was complaining of more pain in the area and it appears that GranuFoam was applied directly to her skin and she has some irritation from this. The heel ulcer is filling in with excellent granulation tissue. It does not probe to bone any longer. Minimal slough accumulation. 03/21/2023: The gluteal ulcer is shallower and the cavity is tighter. She has some periwound skin breakdown that we have been trying to address by applying DuoDERM to the skin around the wound opening. The home health nurse also added nystatin powder to address some yeasty-looking areas. The heel ulcer is much shallower and smaller. Minimal slough and biofilm buildup. 04/04/2023: The gluteal ulcer continues to get shallower and narrower. The periwound skin looks much better this week. The heel ulcer has a band of epithelialized tissue separating the site into 2 openings. There is very little depth remaining and the wounds are quite clean with minimal slough on the surface. 04/18/2023: The heel ulcer is nearly completely closed. There is just 1 open very superficial area remaining underneath some eschar and slough. The gluteal ulcer still has the same depth at its deepest point, but everything is much narrower and constricted. The wound is clean without any slough. 05/02/2023: The heel ulcer is closed. She has had a recurrence of the rash on her gluteus around the wound. Her home health nurse left the wound VAC off after her visit on Monday because of this. The gluteal ulcer continues to contract and has a very clean surface. 05/16/2023: The gluteal fungal rash has worsened. The patient is complaining of significant itching and discomfort. Her home health nurse has been applying silver alginate to the periwound skin to try and protect it, but she really does not have a whole lot of drainage due to the excellent function of the wound VAC. The wound depth has come in a bit more and the orifice is narrowing  further. Electronic Signature(s) Signed: 05/16/2023 12:19:18 PM By: Duanne Guess MD FACS Entered By: Duanne Guess on 05/16/2023 09:19:18 -------------------------------------------------------------------------------- Physical Exam Details Patient Name: Date of Service: [name] 05/16/2023 10:30 A M Medical Record Number: [id number] Patient Account Number: [id number] Date of Birth/Sex: Treating RN: [birth year] (72 y.o. F) Primary Care Provider: Saralyn Pilar Other Clinician: Referring Provider: Treating Provider/Extender: [name] Weeks in Treatment: 35 Constitutional . . . . no acute distress. Respiratory Normal work of breathing on room air. [name], [name] ([id number]) [id number] Page 3 of 10 Notes 05/16/2023: The gluteal fungal rash has worsened. The patient is complaining of significant itching and discomfort. The wound depth has come in a bit more and the orifice is narrowing further. Electronic Signature(s) Signed: 05/16/2023 12:19:53 PM By: Duanne Guess MD FACS Entered By: Duanne Guess on 05/16/2023 09:19:53 -------------------------------------------------------------------------------- Physician Orders Details Patient Name: Date of Service: [name]. 05/16/2023 10:30 A M Medical Record Number: [id number] Patient Account Number: [id number] Date of Birth/Sex: Treating RN: February 22, 1951 (72 y.o. [name] Primary Care Provider: Saralyn Pilar Other Clinician: Referring Provider: Treating Provider/Extender: Park Liter in Treatment: 58 The following information was scribed by: Zenaida Deed The information was scribed for: Duanne Guess Verbal / Phone Orders: No Diagnosis Coding ICD-10  [name], [name] ([id number]) [id number] Page 1 of 10 Visit Report for 05/16/2023 Chief Complaint Document Details Patient Name: Date of Service: [name], [name] 05/16/2023 10:30 A M Medical Record Number: [id number] Patient Account Number: [id number] Date of Birth/Sex: Treating RN: 12/24/1950 (72 y.o. F) Primary Care Provider: Saralyn Pilar Other Clinician: Referring Provider: Treating Provider/Extender: Park Liter in Treatment: 35 Information Obtained from: Patient Chief Complaint Patient is at the clinic for treatment of open pressure ulcers Electronic Signature(s) Signed: 05/16/2023 12:18:08 PM By: Duanne Guess MD FACS Entered By: Duanne Guess on 05/16/2023 09:18:08 -------------------------------------------------------------------------------- HPI Details Patient Name: Date of Service: Marisa Roux E. 05/16/2023 10:30 A M Medical Record Number: [id number] Patient Account Number: [id number] Date of Birth/Sex: Treating RN: [birth year] (72 y.o. F) Primary Care Provider: Saralyn Pilar Other Clinician: Referring Provider: Treating Provider/Extender: Park Liter in Treatment: 35 History of Present Illness HPI Description: ADMISSION 09/07/2023 This is a 72 year old woman with a past medical history notable for obesity, congestive heart failure, Raynaud's syndrome, CKD stage IIIb, osteoporosis, and rheumatoid arthritis. In November 2023, she suffered a fall that resulted in a femur fracture. She was hospitalized for about a week and underwent surgical repair of the fracture. She subsequently developed pressure ulcers on her right buttocks and ischium. She has been receiving home health services and they have been applying Medihoney. They have been reporting the wounds as stage II, but on evaluation, the large ulcer was probably unstageable at the time they were evaluating it but it is clearly a  stage IV at this point. The smaller ulcer does have fat layer exposure and therefore is a stage III. The patient is accompanied by her daughter. She says she has been sleeping on a regular bed but recently ordered an air mattress T opper, but does not have it yet. She is on Ozempic for weight loss and therefore has a poor appetite and struggles to get adequate protein intake. 09/15/2022: The large stage IV ulcer is substantially cleaner this week. The stage III ulcer is a little smaller. Both have slough accumulation. The culture that I took last week was polymicrobial. The Augmentin that I prescribed was adequate coverage for the species and she continues to take this. We have also ordered Keystone topical antibiotic compound, but this has not yet arrived. 09/21/2022: The stage IV ulcer has some necrotic muscle at the base but is otherwise fairly clean. The stage III ulcer is smaller again with light slough on the surface. She has her Keystone topical antibiotic compound with her today. 09/29/2022: There is still some necrotic muscle at the base of the stage IV ulcer that I was unable to get to last week. The stage III ulcer has healed. She unfortunately has developed a new pressure ulcer on her right heel. 10/06/2022: Still with some devitalized muscle at the base of the stage IV ulcer, but otherwise this wound is looking quite clean. The pressure induced tissue injury on her heel is demarcating and much of the area appears to be epithelialized, but there is still 2 areas that remain questionable. 10/12/2022: The stage IV ulcer is very clean and ready for wound VAC, which will be delivered tomorrow according to the patient. The tissue injury on her heel is drying up and does not feel particularly boggy this week. 10/19/2022: The heel injury continues to improve. There is still some dry eschar present on the more plantar aspect of it. There is some slough accumulation on the  05/16/2023 09:17:55 -------------------------------------------------------------------------------- Progress Note Details Patient Name: Date of Service: [name], [name] 05/16/2023 10:30 A M Medical Record Number: [id number] Patient Account Number: [id number] Date of Birth/Sex: Treating RN: Aug 16, 1950 (72 y.o. F) Primary Care Provider: Saralyn Pilar Other Clinician: Referring Provider: Treating Provider/Extender: Park Liter in Treatment: 35 Subjective Chief Complaint Information obtained from Patient Patient is at the clinic for treatment of open pressure ulcers History of Present Illness (HPI) ADMISSION 09/07/2023 This is a 72 year old woman with a past medical history notable for obesity, congestive heart failure, Raynaud's syndrome, CKD stage IIIb, osteoporosis, and rheumatoid arthritis. In November 2023, she suffered a fall that resulted in a femur fracture. She was hospitalized for about a week and underwent surgical repair of the fracture. She subsequently developed pressure ulcers on her right buttocks and ischium. She has been receiving home health services and they have been applying Medihoney. They have been reporting the wounds as stage II, but on evaluation, the large ulcer was probably unstageable at the time they were evaluating it but it is clearly a stage IV at this point. The smaller ulcer does have fat layer exposure and therefore is a stage III. The patient is accompanied by her daughter. She says she has been sleeping on a regular bed but recently ordered an air mattress T opper, but does not have it yet. She is on Ozempic for weight loss and therefore has a poor appetite and struggles to get adequate protein intake. 09/15/2022: The large stage IV ulcer is substantially  cleaner this week. The stage III ulcer is a little smaller. Both have slough accumulation. The culture that I took last week was polymicrobial. The Augmentin that I prescribed was adequate coverage for the species and she continues to take this. We have also ordered Keystone topical antibiotic compound, but this has not yet arrived. 09/21/2022: The stage IV ulcer has some necrotic muscle at the base but is otherwise fairly clean. The stage III ulcer is smaller again with light slough on the surface. She has her Keystone topical antibiotic compound with her today. 09/29/2022: There is still some necrotic muscle at the base of the stage IV ulcer that I was unable to get to last week. The stage III ulcer has healed. She unfortunately has developed a new pressure ulcer on her right heel. 10/06/2022: Still with some devitalized muscle at the base of the stage IV ulcer, but otherwise this wound is looking quite clean. The pressure induced tissue injury on her heel is demarcating and much of the area appears to be epithelialized, but there is still 2 areas that remain questionable. 10/12/2022: The stage IV ulcer is very clean and ready for wound VAC, which will be delivered tomorrow according to the patient. The tissue injury on her heel is drying up and does not feel particularly boggy this week. 10/19/2022: The heel injury continues to improve. There is still some dry eschar present on the more plantar aspect of it. There is some slough accumulation on the surface of the stage IV pressure ulcer. The wound VAC was initiated by home health last week. 11/01/2022: The gluteal pressure ulcer is very clean without any necrotic tissue or slough. The depth has come in by over half a centimeter. The deep tissue injury on her heel continues to improve. There is still dry eschar that we are painting with Betadine as well as some fresh-looking viable tissue at the margin. [name], [name] ([id number])  [id number] Page 6 of  Coding Code Description L89.314 Pressure ulcer of right buttock, stage 4 I73.00 Raynaud's syndrome without gangrene I50.32 Chronic diastolic (congestive) heart  failure N18.32 Chronic kidney disease, stage 3b R73.02 Impaired glucose tolerance (oral) E66.9 Obesity, unspecified Follow-up Appointments ppointment in 2 weeks. - Dr Lady Gary Rm 1 Return A Wed 10/16 @ 11:15 am Anesthetic Wound #1 Right Gluteus (In clinic) Topical Lidocaine 4% applied to wound bed Bathing/ Shower/ Hygiene May shower and wash wound with soap and water. Negative Presssure Wound Therapy Wound #1 Right Gluteus Wound Vac to wound continuously at 146mm/hg pressure Black Foam Off-Loading Heel suspension boot to: - globoped right foot to ambulate Low air-loss mattress (Group 2) - Adapt Turn and reposition every 2 hours - avoid lying on back, stand at least every hour while out of bed, float heels off bed with pillows under calves while in bed Prevalon Boot - right foot esp. while in bed. Additional Orders / Instructions Follow Nutritious Diet - increase protein to 70-80 gms per day, hold ozempic Juven Shake 1-2 times daily. Home Health New wound care orders this week; continue Home Health for wound care. May utilize formulary equivalent dressing for wound treatment orders unless otherwise specified. Other Home Health Orders/Instructions: - [facility] Wound Treatment Wound #1 - Gluteus Wound Laterality: Right Cleanser: Wound Cleanser 3 x Per Week/30 Days Discharge Instructions: Cleanse the wound with wound cleanser prior to applying a clean dressing using gauze sponges, not tissue or cotton balls. JOAN, HERSCHBERGER ([id number]) [id number] Page 4 of 10 Peri-Wound Care: Skin Prep 3 x Per Week/30 Days Discharge Instructions: Use skin prep as directed Peri-Wound Care: Nystop Powder (Nystatin) 3 x Per Week/30 Days Discharge Instructions: Apply Nystop (Nystatin) Powder as needed to irritated skin Prim Dressing: Promogran Prisma Matrix, 4.34 (sq in) (silver collagen) 3 x Per Week/30 Days ary Discharge Instructions: Moisten collagen with saline or  hydrogel Prim Dressing: Maxorb Extra Calcium Alginate, 2x2 (in/in) 3 x Per Week/30 Days ary Discharge Instructions: Apply to irritated periwound under drape as needed for maceration Prim Dressing: VAC ary 3 x Per Week/30 Days Patient Medications llergies: Sulfa (Sulfonamide Antibiotics), Cymbalta, Demerol, Iodinated Contrast Media, morphine, sulfasalazine, adhesive A Notifications Medication Indication Start End 05/16/2023 fluconazole DOSE oral 200 mg tablet - 1 tab p.o. weekly x 4 weeks Electronic Signature(s) Signed: 05/16/2023 4:32:07 PM By: Duanne Guess MD FACS Previous Signature: 05/16/2023 12:16:27 PM Version By: Duanne Guess MD FACS Entered By: Duanne Guess on 05/16/2023 09:29:32 -------------------------------------------------------------------------------- Problem List Details Patient Name: Date of Service: [name]. 05/16/2023 10:30 A M Medical Record Number: [id number] Patient Account Number: [id number] Date of Birth/Sex: Treating RN: [birth year] (72 y.o. [name] Primary Care Provider: Saralyn Pilar Other Clinician: Referring Provider: Treating Provider/Extender: [name] Weeks in Treatment: 63 Active Problems ICD-10 Encounter Code Description Active Date MDM Diagnosis L89.314 Pressure ulcer of right buttock, stage 4 09/07/2022 No Yes I73.00 Raynaud's syndrome without gangrene 09/07/2022 No Yes I50.32 Chronic diastolic (congestive) heart failure 09/07/2022 No Yes N18.32 Chronic kidney disease, stage 3b 09/07/2022 No Yes R73.02 Impaired glucose tolerance (oral) 09/07/2022 No Yes E66.9 Obesity, unspecified 09/07/2022 No Yes [name], [name] ([id number]) [id number] Page 5 of 10 Inactive Problems ICD-10 Code Description Active Date Inactive Date L89.313 Pressure ulcer of right buttock, stage 3 09/07/2022 09/07/2022 Resolved Problems ICD-10 Code Description Active Date Resolved Date L89.614  Pressure ulcer of right heel, stage 4 09/28/2022 09/28/2022 Electronic Signature(s) Signed: 05/16/2023 12:17:55 PM By: Duanne Guess MD FACS Entered By: Duanne Guess on  to try and protect it, but she really does not have a whole lot of drainage due to the excellent function of the wound VAC. The wound depth has come in a bit more and the orifice is narrowing further. No debridement was necessary for the gluteal ulcer. We have been using topical nystatin with limited positive result to address the gluteal fungal rash. I am going to go ahead and send in a prescription for fluconazole to see if we can systemically conquer this issue. Continue negative pressure wound therapy. Follow-up in 2 weeks. Electronic Signature(s) Signed: 05/16/2023 12:30:38 PM By: Duanne Guess MD FACS Entered By: Duanne Guess on 05/16/2023 09:30:38 -------------------------------------------------------------------------------- HxROS Details Patient Name: Date of Service: Marisa Roux E. 05/16/2023 10:30 A M Medical Record Number: [id number] Patient Account Number: [id number] Date of Birth/Sex: Treating RN: 1950/10/09 (72 y.o. F) Primary Care Provider: Saralyn Pilar Other Clinician: Referring Provider: Treating Provider/Extender: Park Liter in Treatment: 35 Information Obtained From Patient Caregiver Chart Constitutional Symptoms (General Health) Medical History: Past Medical History Notes: morbid obesity Eyes Medical History: Negative for: Cataracts; Glaucoma; Optic Neuritis Ear/Nose/Mouth/Throat Medical History: Positive for: Chronic sinus problems/congestion - chronic rhinitis Negative for: Middle ear problems Respiratory JENASIS, STRALEY ([id number]) [id number] Page 9 of 10 Medical History: Positive for: Sleep Apnea - uses CPAP Cardiovascular Medical History: Positive for: Congestive Heart Failure Past Medical History Notes: hyperlipidemia Gastrointestinal Medical History: Past Medical History Notes: GERD, IBS,  eosinophilic esophagitis, diverticulosis, Endocrine Medical History: Negative for: Type I Diabetes; Type II Diabetes Genitourinary Medical History: Negative for: End Stage Renal Disease Past Medical History Notes: CKD stage 3 Immunological Medical History: Positive for: Raynauds Negative for: Lupus Erythematosus; Scleroderma Past Medical History Notes: Sjoegren syndrome, fibromyalgia Integumentary (Skin) Medical History: Negative for: History of Burn Musculoskeletal Medical History: Positive for: Rheumatoid Arthritis Negative for: Gout; Osteoarthritis; Osteomyelitis Neurologic Medical History: Positive for: Neuropathy Negative for: Dementia; Quadriplegia; Paraplegia; Seizure Disorder Oncologic Medical History: Negative for: Received Chemotherapy; Received Radiation Psychiatric Medical History: Negative for: Anorexia/bulimia; Confinement Anxiety HBO Extended History Items Ear/Nose/Mouth/Throat: Chronic sinus problems/congestion Immunizations Pneumococcal Vaccine: Received Pneumococcal Vaccination: Yes Received Pneumococcal Vaccination On or After 60th Birthday: Yes Implantable Devices None Hospitalization / Surgery History Type of Hospitalization/Surgery AIJAH, LATTNER ([id number]) [id number] Page 10 of 10 right femur fx ORIR right knee replacement left breast mass excision left ankle tibia repair abdominal hysterectomy adnoidectomy/tonsillectomy cholecystectomy dental implants patoid cystectomy tubal ligation Family and Social History Cancer: Yes - Father,Paternal Grandparents; Diabetes: No; Heart Disease: Yes - Mother,Father,Paternal Grandparents; Hereditary Spherocytosis: No; Hypertension: Yes - Mother; Kidney Disease: No; Lung Disease: No; Seizures: No; Stroke: Yes - Maternal Grandparents; Thyroid Problems: No; Tuberculosis: No; Never smoker; Marital Status - Widowed; Alcohol Use: Rarely; Drug Use: Prior History - TCH; Caffeine  Use: Daily - soda, tea; Financial Concerns: No; Food, Clothing or Shelter Needs: No; Support System Lacking: No; Transportation Concerns: No Electronic Signature(s) Signed: 05/16/2023 4:32:07 PM By: Duanne Guess MD FACS Entered By: Duanne Guess on 05/16/2023 09:19:24 -------------------------------------------------------------------------------- SuperBill Details Patient Name: Date of Service: [name] 05/16/2023 Medical Record Number: [id number] Patient Account Number: [id number] Date of Birth/Sex: Treating RN: 03/01/1951 (72 y.o. [name] Primary Care Provider: Saralyn Pilar Other Clinician: Referring Provider: Treating Provider/Extender: [name] Weeks in Treatment: 35 Diagnosis Coding ICD-10 Codes Code Description L89.314 Pressure ulcer of right buttock, stage 4 I73.00 Raynaud's syndrome without gangrene I50.32 Chronic diastolic (congestive) heart failure N18.32 Chronic kidney disease, stage 3b R73.02 Impaired glucose  other debris accumulation. The heel continues to fill in granulation tissue over where the bone had been exposed. There is some slough on the wound surface. 03/07/2023: The gluteal ulcer was measured slightly deeper today, but on palpation, it feels roughly the same. The cavity is certainly  more contracted. She was complaining of more pain in the area and it appears that GranuFoam was applied directly to her skin and she has some irritation from this. The heel ulcer is filling in with excellent granulation tissue. It does not probe to bone any longer. Minimal slough accumulation. 03/21/2023: The gluteal ulcer is shallower and the cavity is tighter. She has some periwound skin breakdown that we have been trying to address by applying DuoDERM to the skin around the wound opening. The home health nurse also added nystatin powder to address some yeasty-looking areas. The heel ulcer is much shallower and smaller. Minimal slough and biofilm buildup. 04/04/2023: The gluteal ulcer continues to get shallower and narrower. The periwound skin looks much better this week. The heel ulcer has a band of epithelialized tissue separating the site into 2 openings. There is very little depth remaining and the wounds are quite clean with minimal slough on the surface. 04/18/2023: The heel ulcer is nearly completely closed. There is just 1 open very superficial area remaining underneath some eschar and slough. The gluteal ulcer still has the same depth at its deepest point, but everything is much narrower and constricted. The wound is clean without any slough. 05/02/2023: The heel ulcer is closed. She has had a recurrence of the rash on her gluteus around the wound. Her home health nurse left the wound VAC off after her visit on Monday because of this. The gluteal ulcer continues to contract and has a very clean surface. 05/16/2023: The gluteal fungal rash has worsened. The patient is complaining of significant itching and discomfort. Her home health nurse has been applying silver alginate to the periwound skin to try and protect it, but she really does not have a whole lot of drainage due to the excellent function of the wound VAC. The wound depth has come in a bit more and the orifice is narrowing  further. Electronic Signature(s) Signed: 05/16/2023 12:19:18 PM By: Duanne Guess MD FACS Entered By: Duanne Guess on 05/16/2023 09:19:18 -------------------------------------------------------------------------------- Physical Exam Details Patient Name: Date of Service: [name] 05/16/2023 10:30 A M Medical Record Number: [id number] Patient Account Number: [id number] Date of Birth/Sex: Treating RN: [birth year] (72 y.o. F) Primary Care Provider: Saralyn Pilar Other Clinician: Referring Provider: Treating Provider/Extender: [name] Weeks in Treatment: 35 Constitutional . . . . no acute distress. Respiratory Normal work of breathing on room air. [name], [name] ([id number]) [id number] Page 3 of 10 Notes 05/16/2023: The gluteal fungal rash has worsened. The patient is complaining of significant itching and discomfort. The wound depth has come in a bit more and the orifice is narrowing further. Electronic Signature(s) Signed: 05/16/2023 12:19:53 PM By: Duanne Guess MD FACS Entered By: Duanne Guess on 05/16/2023 09:19:53 -------------------------------------------------------------------------------- Physician Orders Details Patient Name: Date of Service: [name]. 05/16/2023 10:30 A M Medical Record Number: [id number] Patient Account Number: [id number] Date of Birth/Sex: Treating RN: February 22, 1951 (72 y.o. [name] Primary Care Provider: Saralyn Pilar Other Clinician: Referring Provider: Treating Provider/Extender: Park Liter in Treatment: 58 The following information was scribed by: Zenaida Deed The information was scribed for: Duanne Guess Verbal / Phone Orders: No Diagnosis Coding ICD-10

## 2023-05-17 NOTE — Progress Notes (Signed)
Has Paino Yes Site Locations Pain Location: Pain in Ulcers With Dressing Change: Yes Duration of the Pain. Constant / Intermittento Intermittent Rate the pain. Current Pain Level: 3 Worst Pain Level: 8 Least Pain Level: 0 Character of Pain Describe the Pain: Aching Pain Management and Medication Current Pain Management: Medication: Yes Is the Current Pain Management Adequate: Adequate How does your wound  impact your activities of daily livingo Sleep: No Bathing: No Appetite: No Relationship With Others: No Bladder Continence: No Emotions: No Bowel Continence: No Work: No Toileting: No Drive: No Dressing: No Hobbies: No Electronic Signature(s) Signed: 05/16/2023 5:18:43 PM By: Zenaida Deed RN, BSN Entered By: Zenaida Deed on 05/16/2023 07:42:37 -------------------------------------------------------------------------------- Patient/Caregiver Education Details Patient Name: Date of Service: [name] 10/2/2024andnbsp10:30 A M Medical Record Number: [id number] Patient Account Number: [id number] Date of Birth/Gender: Treating RN: 03/28/1951 (72 y.o. [name] Primary Care Physician: Saralyn Pilar Other Clinician: Referring Physician: Treating Physician/Extender: Park Liter in Treatment: 35 Education Assessment Education Provided To: Patient Education Topics Provided Wound/Skin Impairment: Methods: Explain/Verbal Responses: Reinforcements needed, State content correctly Electronic Signature(s) [name], [name] ([id number]) [id number].pdf Page 6 of 7 Signed: 05/16/2023 5:18:43 PM By: Zenaida Deed RN, BSN Entered By: Zenaida Deed on 05/16/2023 08:08:59 -------------------------------------------------------------------------------- Wound Assessment Details Patient Name: Date of Service: Marisa, Cooper 05/16/2023 10:30 A M Medical Record Number: [id number] Patient Account Number: [id number] Date of Birth/Sex: Treating RN: [birth year] (72 y.o. [name] Primary Care Lindbergh Winkles: Saralyn Pilar Other Clinician: Referring Brynja Marker: Treating Maxtyn Nuzum/Extender: [name] Weeks in Treatment: 35 Wound Status Wound Number: 1 Primary Pressure Ulcer Etiology: Wound Location: Right Gluteus Wound Open Wounding Event: Pressure Injury Status: Date Acquired: 08/11/2022 Comorbid  Chronic sinus problems/congestion, Sleep Apnea, Congestive Heart Weeks Of Treatment: 35 History: Failure, Raynauds, Rheumatoid Arthritis, Neuropathy Clustered Wound: No Photos Wound Measurements Length: (cm) 3.5 Width: (cm) 4 Depth: (cm) 5.7 Area: (cm) 10.996 Volume: (cm) 62.675 % Reduction in Area: 50% % Reduction in Volume: 52.5% Epithelialization: None Tunneling: Yes Position (o'clock): 1 Maximum Distance: (cm) 7 Undermining: No Wound Description Classification: Category/Stage IV Wound Margin: Well defined, not attached Exudate Amount: Medium Exudate Type: Serosanguineous Exudate Color: red, brown Foul Odor After Cleansing: No Slough/Fibrino No Wound Bed Granulation Amount: Large (67-100%) Exposed Structure Granulation Quality: Red Fascia Exposed: No Necrotic Amount: None Present (0%) Fat Layer (Subcutaneous Tissue) Exposed: Yes Tendon Exposed: No Muscle Exposed: No Joint Exposed: No Bone Exposed: No Periwound Skin Texture Texture Color No Abnormalities Noted: No No Abnormalities Noted: Yes Callus: No Temperature / Pain Crepitus: No Temperature: No Abnormality Excoriation: No Tenderness on PalpationALEXZANDREA, Cooper ([id number]) [id number] Page 7 of 7 Induration: No Rash: Yes Scarring: No Moisture No Abnormalities Noted: Yes Treatment Notes Wound #1 (Gluteus) Wound Laterality: Right Cleanser Wound Cleanser Discharge Instruction: Cleanse the wound with wound cleanser prior to applying a clean dressing using gauze sponges, not tissue or cotton balls. Peri-Wound Care Skin Prep Discharge Instruction: Use skin prep as directed Nystop Powder (Nystatin) Discharge Instruction: Apply Nystop (Nystatin) Powder as needed to irritated skin Topical Primary Dressing Promogran Prisma Matrix, 4.34 (sq in) (silver collagen) Discharge Instruction: Moisten collagen with saline or hydrogel Maxorb Extra Calcium Alginate, 2x2  (in/in) Discharge Instruction: Apply to irritated periwound under drape as needed for maceration VAC Secondary Dressing Secured With Compression Wrap Compression Stockings Add-Ons Electronic Signature(s) Signed: 05/16/2023 5:18:43 PM By: Zenaida Deed RN, BSN Entered By: Zenaida Deed on 05/16/2023 08:06:20 -------------------------------------------------------------------------------- Vitals Details Patient  Marisa, Cooper ([id number]) [id number] Page 1 of 7 Visit Report for 05/16/2023 Arrival Information Details Patient Name: Date of Service: Marisa, Cooper 05/16/2023 10:30 A M Medical Record Number: [id number] Patient Account Number: [id number] Date of Birth/Sex: Treating RN: [birth year] (72 y.o. [name] Primary Care Pearlie Lafosse: Saralyn Pilar Other Clinician: Referring Kilynn Fitzsimmons: Treating Emiya Loomer/Extender: Park Liter in Treatment: 35 Visit Information History Since Last Visit Added or deleted any medications: No Patient Arrived: [name] Any new allergies or adverse reactions: No Arrival Time: 10:37 Had a fall or experienced change in No Accompanied By: caregiver activities of daily living that may affect Transfer Assistance: None risk of falls: Patient Identification Verified: Yes Signs or symptoms of abuse/neglect since last visito No Secondary Verification Process Completed: Yes Hospitalized since last visit: No Patient Requires Transmission-Based Precautions: No Implantable device outside of the clinic excluding No Patient Has Alerts: No cellular tissue based products placed in the center since last visit: Has Dressing in Place as Prescribed: Yes Pain Present Now: Yes Electronic Signature(s) Signed: 05/16/2023 5:18:43 PM By: Zenaida Deed RN, BSN Entered By: Zenaida Deed on 05/16/2023 07:41:37 -------------------------------------------------------------------------------- Encounter Discharge Information Details Patient Name: Date of Service: [name]. 05/16/2023 10:30 A M Medical Record Number: [id number] Patient Account Number: [id number] Date of Birth/Sex: Treating RN: 10/03/1950 (72 y.o. [name] Primary Care Davyon Fisch: Saralyn Pilar Other Clinician: Referring Zakeria Kulzer: Treating Kalah Pflum/Extender: Park Liter in Treatment: 35 Encounter Discharge Information  Items Discharge Condition: Stable Ambulatory Status: Walker Discharge Destination: Home Transportation: Private Auto Accompanied By: caregiver Schedule Follow-up Appointment: Yes Clinical Summary of Care: Patient Declined Electronic Signature(s) Signed: 05/16/2023 5:18:43 PM By: Zenaida Deed RN, BSN Entered By: Zenaida Deed on 05/16/2023 11:46:44 [name] ([id number]) [id number] Page 2 of 7 -------------------------------------------------------------------------------- Lower Extremity Assessment Details Patient Name: Date of Service: JALANI, CULLIFER 05/16/2023 10:30 A M Medical Record Number: [id number] Patient Account Number: [id number] Date of Birth/Sex: Treating RN: [birth year] (72 y.o. [name] Primary Care Tildon Silveria: Saralyn Pilar Other Clinician: Referring Dylan Monforte: Treating Salim Forero/Extender: [name] Weeks in Treatment: 35 Electronic Signature(s) Signed: 05/16/2023 5:18:43 PM By: Zenaida Deed RN, BSN Entered By: Zenaida Deed on 05/16/2023 07:45:52 -------------------------------------------------------------------------------- Multi Wound Chart Details Patient Name: Date of Service: [name]. 05/16/2023 10:30 A M Medical Record Number: [id number] Patient Account Number: [id number] Date of Birth/Sex: Treating RN: 1950-11-15 (72 y.o. F) Primary Care Avry Roedl: Saralyn Pilar Other Clinician: Referring Airyonna Franklyn: Treating Yahsir Wickens/Extender: Park Liter in Treatment: 35 Vital Signs Height(in): 62 Pulse(bpm): 60 Weight(lbs): 207 Blood Pressure(mmHg): 115/55 Body Mass Index(BMI): 37.9 Temperature(F): 98.2 Respiratory Rate(breaths/min): 20 [1:Photos:] [N/A:N/A] Right Gluteus N/A N/A Wound Location: Pressure Injury N/A N/A Wounding Event: Pressure Ulcer N/A N/A Primary Etiology: Chronic sinus problems/congestion, N/A N/A Comorbid History: Sleep Apnea,  Congestive Heart Failure, Raynauds, Rheumatoid Arthritis, Neuropathy 08/11/2022 N/A N/A Date Acquired: 35 N/A N/A Weeks of Treatment: Open N/A N/A Wound Status: No N/A N/A Wound Recurrence: 3.5x4x5.7 N/A N/A Measurements L x W x D (cm) 10.996 N/A N/A A (cm) : rea 62.675 N/A N/A Volume (cm) : 50.00% N/A N/A % Reduction in A rea: 52.50% N/A N/A % Reduction in Volume: 1 Position 1 (o'clock): 7 Maximum Distance 1 (cm): Yes N/A N/A Tunneling: Category/Stage IV N/A N/A Classification: Medium N/A N/A Exudate A mount: Serosanguineous N/A N/A Exudate TypeKATHERENE, DININO ([id number]) [id number] Page 3 of 7 red, brown N/A N/A Exudate Color: Well defined, not  Has Paino Yes Site Locations Pain Location: Pain in Ulcers With Dressing Change: Yes Duration of the Pain. Constant / Intermittento Intermittent Rate the pain. Current Pain Level: 3 Worst Pain Level: 8 Least Pain Level: 0 Character of Pain Describe the Pain: Aching Pain Management and Medication Current Pain Management: Medication: Yes Is the Current Pain Management Adequate: Adequate How does your wound  impact your activities of daily livingo Sleep: No Bathing: No Appetite: No Relationship With Others: No Bladder Continence: No Emotions: No Bowel Continence: No Work: No Toileting: No Drive: No Dressing: No Hobbies: No Electronic Signature(s) Signed: 05/16/2023 5:18:43 PM By: Zenaida Deed RN, BSN Entered By: Zenaida Deed on 05/16/2023 07:42:37 -------------------------------------------------------------------------------- Patient/Caregiver Education Details Patient Name: Date of Service: [name] 10/2/2024andnbsp10:30 A M Medical Record Number: [id number] Patient Account Number: [id number] Date of Birth/Gender: Treating RN: 03/28/1951 (72 y.o. [name] Primary Care Physician: Saralyn Pilar Other Clinician: Referring Physician: Treating Physician/Extender: Park Liter in Treatment: 35 Education Assessment Education Provided To: Patient Education Topics Provided Wound/Skin Impairment: Methods: Explain/Verbal Responses: Reinforcements needed, State content correctly Electronic Signature(s) [name], [name] ([id number]) [id number].pdf Page 6 of 7 Signed: 05/16/2023 5:18:43 PM By: Zenaida Deed RN, BSN Entered By: Zenaida Deed on 05/16/2023 08:08:59 -------------------------------------------------------------------------------- Wound Assessment Details Patient Name: Date of Service: Marisa, Cooper 05/16/2023 10:30 A M Medical Record Number: [id number] Patient Account Number: [id number] Date of Birth/Sex: Treating RN: [birth year] (72 y.o. [name] Primary Care Lindbergh Winkles: Saralyn Pilar Other Clinician: Referring Brynja Marker: Treating Maxtyn Nuzum/Extender: [name] Weeks in Treatment: 35 Wound Status Wound Number: 1 Primary Pressure Ulcer Etiology: Wound Location: Right Gluteus Wound Open Wounding Event: Pressure Injury Status: Date Acquired: 08/11/2022 Comorbid  Chronic sinus problems/congestion, Sleep Apnea, Congestive Heart Weeks Of Treatment: 35 History: Failure, Raynauds, Rheumatoid Arthritis, Neuropathy Clustered Wound: No Photos Wound Measurements Length: (cm) 3.5 Width: (cm) 4 Depth: (cm) 5.7 Area: (cm) 10.996 Volume: (cm) 62.675 % Reduction in Area: 50% % Reduction in Volume: 52.5% Epithelialization: None Tunneling: Yes Position (o'clock): 1 Maximum Distance: (cm) 7 Undermining: No Wound Description Classification: Category/Stage IV Wound Margin: Well defined, not attached Exudate Amount: Medium Exudate Type: Serosanguineous Exudate Color: red, brown Foul Odor After Cleansing: No Slough/Fibrino No Wound Bed Granulation Amount: Large (67-100%) Exposed Structure Granulation Quality: Red Fascia Exposed: No Necrotic Amount: None Present (0%) Fat Layer (Subcutaneous Tissue) Exposed: Yes Tendon Exposed: No Muscle Exposed: No Joint Exposed: No Bone Exposed: No Periwound Skin Texture Texture Color No Abnormalities Noted: No No Abnormalities Noted: Yes Callus: No Temperature / Pain Crepitus: No Temperature: No Abnormality Excoriation: No Tenderness on PalpationALEXZANDREA, Cooper ([id number]) [id number] Page 7 of 7 Induration: No Rash: Yes Scarring: No Moisture No Abnormalities Noted: Yes Treatment Notes Wound #1 (Gluteus) Wound Laterality: Right Cleanser Wound Cleanser Discharge Instruction: Cleanse the wound with wound cleanser prior to applying a clean dressing using gauze sponges, not tissue or cotton balls. Peri-Wound Care Skin Prep Discharge Instruction: Use skin prep as directed Nystop Powder (Nystatin) Discharge Instruction: Apply Nystop (Nystatin) Powder as needed to irritated skin Topical Primary Dressing Promogran Prisma Matrix, 4.34 (sq in) (silver collagen) Discharge Instruction: Moisten collagen with saline or hydrogel Maxorb Extra Calcium Alginate, 2x2  (in/in) Discharge Instruction: Apply to irritated periwound under drape as needed for maceration VAC Secondary Dressing Secured With Compression Wrap Compression Stockings Add-Ons Electronic Signature(s) Signed: 05/16/2023 5:18:43 PM By: Zenaida Deed RN, BSN Entered By: Zenaida Deed on 05/16/2023 08:06:20 -------------------------------------------------------------------------------- Vitals Details Patient  Has Paino Yes Site Locations Pain Location: Pain in Ulcers With Dressing Change: Yes Duration of the Pain. Constant / Intermittento Intermittent Rate the pain. Current Pain Level: 3 Worst Pain Level: 8 Least Pain Level: 0 Character of Pain Describe the Pain: Aching Pain Management and Medication Current Pain Management: Medication: Yes Is the Current Pain Management Adequate: Adequate How does your wound  impact your activities of daily livingo Sleep: No Bathing: No Appetite: No Relationship With Others: No Bladder Continence: No Emotions: No Bowel Continence: No Work: No Toileting: No Drive: No Dressing: No Hobbies: No Electronic Signature(s) Signed: 05/16/2023 5:18:43 PM By: Zenaida Deed RN, BSN Entered By: Zenaida Deed on 05/16/2023 07:42:37 -------------------------------------------------------------------------------- Patient/Caregiver Education Details Patient Name: Date of Service: [name] 10/2/2024andnbsp10:30 A M Medical Record Number: [id number] Patient Account Number: [id number] Date of Birth/Gender: Treating RN: 03/28/1951 (72 y.o. [name] Primary Care Physician: Saralyn Pilar Other Clinician: Referring Physician: Treating Physician/Extender: Park Liter in Treatment: 35 Education Assessment Education Provided To: Patient Education Topics Provided Wound/Skin Impairment: Methods: Explain/Verbal Responses: Reinforcements needed, State content correctly Electronic Signature(s) [name], [name] ([id number]) [id number].pdf Page 6 of 7 Signed: 05/16/2023 5:18:43 PM By: Zenaida Deed RN, BSN Entered By: Zenaida Deed on 05/16/2023 08:08:59 -------------------------------------------------------------------------------- Wound Assessment Details Patient Name: Date of Service: Marisa, Cooper 05/16/2023 10:30 A M Medical Record Number: [id number] Patient Account Number: [id number] Date of Birth/Sex: Treating RN: [birth year] (72 y.o. [name] Primary Care Lindbergh Winkles: Saralyn Pilar Other Clinician: Referring Brynja Marker: Treating Maxtyn Nuzum/Extender: [name] Weeks in Treatment: 35 Wound Status Wound Number: 1 Primary Pressure Ulcer Etiology: Wound Location: Right Gluteus Wound Open Wounding Event: Pressure Injury Status: Date Acquired: 08/11/2022 Comorbid  Chronic sinus problems/congestion, Sleep Apnea, Congestive Heart Weeks Of Treatment: 35 History: Failure, Raynauds, Rheumatoid Arthritis, Neuropathy Clustered Wound: No Photos Wound Measurements Length: (cm) 3.5 Width: (cm) 4 Depth: (cm) 5.7 Area: (cm) 10.996 Volume: (cm) 62.675 % Reduction in Area: 50% % Reduction in Volume: 52.5% Epithelialization: None Tunneling: Yes Position (o'clock): 1 Maximum Distance: (cm) 7 Undermining: No Wound Description Classification: Category/Stage IV Wound Margin: Well defined, not attached Exudate Amount: Medium Exudate Type: Serosanguineous Exudate Color: red, brown Foul Odor After Cleansing: No Slough/Fibrino No Wound Bed Granulation Amount: Large (67-100%) Exposed Structure Granulation Quality: Red Fascia Exposed: No Necrotic Amount: None Present (0%) Fat Layer (Subcutaneous Tissue) Exposed: Yes Tendon Exposed: No Muscle Exposed: No Joint Exposed: No Bone Exposed: No Periwound Skin Texture Texture Color No Abnormalities Noted: No No Abnormalities Noted: Yes Callus: No Temperature / Pain Crepitus: No Temperature: No Abnormality Excoriation: No Tenderness on PalpationALEXZANDREA, Cooper ([id number]) [id number] Page 7 of 7 Induration: No Rash: Yes Scarring: No Moisture No Abnormalities Noted: Yes Treatment Notes Wound #1 (Gluteus) Wound Laterality: Right Cleanser Wound Cleanser Discharge Instruction: Cleanse the wound with wound cleanser prior to applying a clean dressing using gauze sponges, not tissue or cotton balls. Peri-Wound Care Skin Prep Discharge Instruction: Use skin prep as directed Nystop Powder (Nystatin) Discharge Instruction: Apply Nystop (Nystatin) Powder as needed to irritated skin Topical Primary Dressing Promogran Prisma Matrix, 4.34 (sq in) (silver collagen) Discharge Instruction: Moisten collagen with saline or hydrogel Maxorb Extra Calcium Alginate, 2x2  (in/in) Discharge Instruction: Apply to irritated periwound under drape as needed for maceration VAC Secondary Dressing Secured With Compression Wrap Compression Stockings Add-Ons Electronic Signature(s) Signed: 05/16/2023 5:18:43 PM By: Zenaida Deed RN, BSN Entered By: Zenaida Deed on 05/16/2023 08:06:20 -------------------------------------------------------------------------------- Vitals Details Patient

## 2023-05-18 LAB — CBC WITH DIFFERENTIAL/PLATELET
Basophils Absolute: 0.1 10*3/uL (ref 0.0–0.2)
Basos: 1 %
EOS (ABSOLUTE): 0.3 10*3/uL (ref 0.0–0.4)
Eos: 5 %
Hematocrit: 32.3 % — ABNORMAL LOW (ref 34.0–46.6)
Hemoglobin: 9.7 g/dL — ABNORMAL LOW (ref 11.1–15.9)
Immature Grans (Abs): 0.1 10*3/uL (ref 0.0–0.1)
Immature Granulocytes: 1 %
Lymphocytes Absolute: 1 10*3/uL (ref 0.7–3.1)
Lymphs: 16 %
MCH: 24.5 pg — ABNORMAL LOW (ref 26.6–33.0)
MCHC: 30 g/dL — ABNORMAL LOW (ref 31.5–35.7)
MCV: 82 fL (ref 79–97)
Monocytes Absolute: 0.6 10*3/uL (ref 0.1–0.9)
Monocytes: 9 %
Neutrophils Absolute: 4.2 10*3/uL (ref 1.4–7.0)
Neutrophils: 68 %
Platelets: 345 10*3/uL (ref 150–450)
RBC: 3.96 x10E6/uL (ref 3.77–5.28)
RDW: 14.8 % (ref 11.7–15.4)
WBC: 6.2 10*3/uL (ref 3.4–10.8)

## 2023-05-18 LAB — COMPREHENSIVE METABOLIC PANEL
ALT: 6 [IU]/L (ref 0–32)
AST: 12 [IU]/L (ref 0–40)
Albumin: 3.9 g/dL (ref 3.9–4.9)
Alkaline Phosphatase: 97 [IU]/L (ref 44–121)
BUN/Creatinine Ratio: 27 (ref 12–28)
BUN: 30 mg/dL — ABNORMAL HIGH (ref 8–27)
Bilirubin Total: <0.2 mg/dL (ref 0.0–1.2)
CO2: 20 mmol/L (ref 20–29)
Calcium: 9.2 mg/dL (ref 8.7–10.3)
Chloride: 104 mmol/L (ref 96–106)
Creatinine, Ser: 1.11 mg/dL — ABNORMAL HIGH (ref 0.57–1.00)
Globulin, Total: 2.5 g/dL (ref 1.5–4.5)
Glucose: 90 mg/dL (ref 70–99)
Potassium: 4.8 mmol/L (ref 3.5–5.2)
Sodium: 137 mmol/L (ref 134–144)
Total Protein: 6.4 g/dL (ref 6.0–8.5)
eGFR: 53 mL/min/{1.73_m2} — ABNORMAL LOW (ref >59)

## 2023-05-18 LAB — HEMOGLOBIN A1C
Est. average glucose Bld gHb Est-mCnc: 108 mg/dL
Hgb A1c MFr Bld: 5.4 % (ref 4.8–5.6)

## 2023-05-18 LAB — LIPID PANEL
Chol/HDL Ratio: 2.7 {ratio} (ref 0.0–4.4)
Cholesterol, Total: 133 mg/dL (ref 100–199)
HDL: 49 mg/dL (ref >39)
LDL Chol Calc (NIH): 59 mg/dL (ref 0–99)
Triglycerides: 146 mg/dL (ref 0–149)
VLDL Cholesterol Cal: 25 mg/dL (ref 5–40)

## 2023-05-18 LAB — TSH: TSH: 2.46 u[IU]/mL (ref 0.450–4.500)

## 2023-05-22 ENCOUNTER — Ambulatory Visit: Payer: PPO | Admitting: Family Medicine

## 2023-05-22 ENCOUNTER — Other Ambulatory Visit: Payer: Self-pay | Admitting: Family Medicine

## 2023-05-22 DIAGNOSIS — D649 Anemia, unspecified: Secondary | ICD-10-CM

## 2023-05-23 ENCOUNTER — Telehealth: Payer: Self-pay | Admitting: Rheumatology

## 2023-05-23 NOTE — Telephone Encounter (Signed)
Noted  

## 2023-05-23 NOTE — Telephone Encounter (Signed)
Patient called to let Dr. Corliss Skains know that she had labwork on 05/17/23 with her PCP.  The results are in Epic.

## 2023-05-30 ENCOUNTER — Encounter (HOSPITAL_BASED_OUTPATIENT_CLINIC_OR_DEPARTMENT_OTHER): Payer: PPO | Admitting: General Surgery

## 2023-05-30 ENCOUNTER — Ambulatory Visit: Payer: PPO | Admitting: Physician Assistant

## 2023-05-30 DIAGNOSIS — K200 Eosinophilic esophagitis: Secondary | ICD-10-CM

## 2023-05-30 DIAGNOSIS — Z8659 Personal history of other mental and behavioral disorders: Secondary | ICD-10-CM

## 2023-05-30 DIAGNOSIS — L89314 Pressure ulcer of right buttock, stage 4: Secondary | ICD-10-CM | POA: Diagnosis not present

## 2023-05-30 DIAGNOSIS — M810 Age-related osteoporosis without current pathological fracture: Secondary | ICD-10-CM

## 2023-05-30 DIAGNOSIS — L89616 Pressure-induced deep tissue damage of right heel: Secondary | ICD-10-CM

## 2023-05-30 DIAGNOSIS — Z8679 Personal history of other diseases of the circulatory system: Secondary | ICD-10-CM

## 2023-05-30 DIAGNOSIS — M0579 Rheumatoid arthritis with rheumatoid factor of multiple sites without organ or systems involvement: Secondary | ICD-10-CM

## 2023-05-30 DIAGNOSIS — M797 Fibromyalgia: Secondary | ICD-10-CM

## 2023-05-30 DIAGNOSIS — Z8669 Personal history of other diseases of the nervous system and sense organs: Secondary | ICD-10-CM

## 2023-05-30 DIAGNOSIS — G8929 Other chronic pain: Secondary | ICD-10-CM

## 2023-05-30 DIAGNOSIS — I7300 Raynaud's syndrome without gangrene: Secondary | ICD-10-CM

## 2023-05-30 DIAGNOSIS — N184 Chronic kidney disease, stage 4 (severe): Secondary | ICD-10-CM

## 2023-05-30 DIAGNOSIS — M3500 Sicca syndrome, unspecified: Secondary | ICD-10-CM

## 2023-05-30 DIAGNOSIS — M1712 Unilateral primary osteoarthritis, left knee: Secondary | ICD-10-CM

## 2023-05-30 DIAGNOSIS — Z79899 Other long term (current) drug therapy: Secondary | ICD-10-CM

## 2023-05-30 DIAGNOSIS — Z96651 Presence of right artificial knee joint: Secondary | ICD-10-CM

## 2023-05-30 DIAGNOSIS — Z8639 Personal history of other endocrine, nutritional and metabolic disease: Secondary | ICD-10-CM

## 2023-05-30 DIAGNOSIS — Z8719 Personal history of other diseases of the digestive system: Secondary | ICD-10-CM

## 2023-05-30 DIAGNOSIS — Z8781 Personal history of (healed) traumatic fracture: Secondary | ICD-10-CM

## 2023-05-30 DIAGNOSIS — M51369 Other intervertebral disc degeneration, lumbar region without mention of lumbar back pain or lower extremity pain: Secondary | ICD-10-CM

## 2023-05-30 NOTE — Progress Notes (Signed)
Marisa, Cooper ([id number]) [id number] Page 1 of 10 Visit Report for 05/30/2023 Chief Complaint Document Details Patient Name: Date of Service: [name] 05/30/2023 11:15 A M Medical Record Number: [id number] Patient Account Number: [id number] Date of Birth/Sex: Treating RN: 19-Jun-1951 (72 y.o. F) Primary Care Provider: Saralyn Pilar Other Clinician: Referring Provider: Treating Provider/Extender: Park Liter in Treatment: 37 Information Obtained from: Patient Chief Complaint Patient is at the clinic for treatment of open pressure ulcers Electronic Signature(s) Signed: 05/30/2023 11:55:17 AM By: Duanne Guess MD FACS Entered By: Duanne Guess on 05/30/2023 08:55:16 -------------------------------------------------------------------------------- HPI Details Patient Name: Date of Service: Marisa Roux E. 05/30/2023 11:15 A M Medical Record Number: [id number] Patient Account Number: [id number] Date of Birth/Sex: Treating RN: March 23, 1951 (72 y.o. F) Primary Care Provider: Saralyn Pilar Other Clinician: Referring Provider: Treating Provider/Extender: Park Liter in Treatment: 37 History of Present Illness HPI Description: ADMISSION 09/07/2023 This is a 72 year old woman with a past medical history notable for obesity, congestive heart failure, Raynaud's syndrome, CKD stage IIIb, osteoporosis, and rheumatoid arthritis. In November 2023, she suffered a fall that resulted in a femur fracture. She was hospitalized for about a week and underwent surgical repair of the fracture. She subsequently developed pressure ulcers on her right buttocks and ischium. She has been receiving home health services and they have been applying Medihoney. They have been reporting the wounds as stage II, but on evaluation, the large ulcer was probably unstageable at the time they were evaluating it but it is  clearly a stage IV at this point. The smaller ulcer does have fat layer exposure and therefore is a stage III. The patient is accompanied by her daughter. She says she has been sleeping on a regular bed but recently ordered an air mattress T opper, but does not have it yet. She is on Ozempic for weight loss and therefore has a poor appetite and struggles to get adequate protein intake. 09/15/2022: The large stage IV ulcer is substantially cleaner this week. The stage III ulcer is a little smaller. Both have slough accumulation. The culture that I took last week was polymicrobial. The Augmentin that I prescribed was adequate coverage for the species and she continues to take this. We have also ordered Keystone topical antibiotic compound, but this has not yet arrived. 09/21/2022: The stage IV ulcer has some necrotic muscle at the base but is otherwise fairly clean. The stage III ulcer is smaller again with light slough on the surface. She has her Keystone topical antibiotic compound with her today. 09/29/2022: There is still some necrotic muscle at the base of the stage IV ulcer that I was unable to get to last week. The stage III ulcer has healed. She unfortunately has developed a new pressure ulcer on her right heel. 10/06/2022: Still with some devitalized muscle at the base of the stage IV ulcer, but otherwise this wound is looking quite clean. The pressure induced tissue injury on her heel is demarcating and much of the area appears to be epithelialized, but there is still 2 areas that remain questionable. 10/12/2022: The stage IV ulcer is very clean and ready for wound VAC, which will be delivered tomorrow according to the patient. The tissue injury on her heel is drying up and does not feel particularly boggy this week. 10/19/2022: The heel injury continues to improve. There is still some dry eschar present on the more plantar aspect of it. There is some slough accumulation on the  other debris accumulation. The heel continues to fill in granulation tissue over where the bone had been exposed. There is some slough on the wound surface. 03/07/2023: The gluteal ulcer was measured slightly deeper today, but on palpation, it feels roughly the same. The cavity is  certainly more contracted. She was complaining of more pain in the area and it appears that GranuFoam was applied directly to her skin and she has some irritation from this. The heel ulcer is filling in with excellent granulation tissue. It does not probe to bone any longer. Minimal slough accumulation. 03/21/2023: The gluteal ulcer is shallower and the cavity is tighter. She has some periwound skin breakdown that we have been trying to address by applying DuoDERM to the skin around the wound opening. The home health nurse also added nystatin powder to address some yeasty-looking areas. The heel ulcer is much shallower and smaller. Minimal slough and biofilm buildup. 04/04/2023: The gluteal ulcer continues to get shallower and narrower. The periwound skin looks much better this week. The heel ulcer has a band of epithelialized tissue separating the site into 2 openings. There is very little depth remaining and the wounds are quite clean with minimal slough on the surface. 04/18/2023: The heel ulcer is nearly completely closed. There is just 1 open very superficial area remaining underneath some eschar and slough. The gluteal ulcer still has the same depth at its deepest point, but everything is much narrower and constricted. The wound is clean without any slough. 05/02/2023: The heel ulcer is closed. She has had a recurrence of the rash on her gluteus around the wound. Her home health nurse left the wound VAC off after her visit on Monday because of this. The gluteal ulcer continues to contract and has a very clean surface. 05/16/2023: The gluteal fungal rash has worsened. The patient is complaining of significant itching and discomfort. Her home health nurse has been applying silver alginate to the periwound skin to try and protect it, but she really does not have a whole lot of drainage due to the excellent function of the wound VAC. The wound depth has come in a bit more and the orifice is narrowing  further. 05/30/2023: She has been taking fluconazole and the fungal rash on her buttocks is improving. She still has skin irritation that may be related to the drape adhesive. The wound itself is smaller and shallower with good granulation tissue on the surface. There are no longer any nooks and crannies in the tissue. Electronic Signature(s) Signed: 05/30/2023 11:56:34 AM By: Duanne Guess MD FACS Entered By: Duanne Guess on 05/30/2023 08:56:34 -------------------------------------------------------------------------------- Physical Exam Details Patient Name: Date of Service: [name] 05/30/2023 11:15 A M Medical Record Number: [id number] Patient Account Number: [id number] Date of Birth/Sex: Treating RN: 1950/10/05 (72 y.o. F) Primary Care Provider: Saralyn Pilar Other Clinician: Referring Provider: Treating Provider/Extender: [name] in Treatment: 37 Constitutional . . . . no acute distress. Respiratory Normal work of breathing on room air. [name], [name] ([id number]) [id number] Page 3 of 10 Notes 05/30/2023: She has been taking fluconazole and the fungal rash on her buttocks is improving. She still has skin irritation that may be related to the drape adhesive. The wound itself is smaller and shallower with good granulation tissue on the surface. There are no longer any nooks and crannies in the tissue. Electronic Signature(s) Signed: 05/30/2023 11:58:36 AM By: Duanne Guess MD FACS Entered By: Duanne Guess on 05/30/2023 08:58:36 -------------------------------------------------------------------------------- Physician Orders  Lowell E ([id number]) [id number] Page 5 of 10 L89.313 Pressure ulcer of right buttock, stage 3 09/07/2022 09/07/2022 Resolved Problems ICD-10 Code Description Active Date Resolved Date L89.614 Pressure ulcer of right heel, stage 4 09/28/2022 09/28/2022 Electronic Signature(s) Signed: 05/30/2023 11:40:48 AM By: Duanne Guess MD FACS Entered By: Duanne Guess on 05/30/2023 08:40:48 -------------------------------------------------------------------------------- Progress Note Details Patient Name: Date of Service: [name]. 05/30/2023 11:15 A M Medical Record Number: [id number] Patient Account Number: [id number] Date of Birth/Sex: Treating RN: 06-Jun-1951 (72 y.o. F) Primary Care Provider: Saralyn Pilar Other Clinician: Referring Provider: Treating Provider/Extender: Park Liter in Treatment: 37 Subjective Chief Complaint Information obtained from Patient Patient is at the clinic for treatment of open pressure ulcers History of Present Illness (HPI) ADMISSION 09/07/2023 This is a 72 year old woman with a past medical history notable for obesity, congestive heart failure, Raynaud's syndrome, CKD stage IIIb, osteoporosis, and rheumatoid arthritis. In November 2023, she suffered a fall that resulted in a femur fracture. She was hospitalized for about a week and underwent surgical repair of the fracture. She subsequently developed pressure ulcers on her right buttocks and ischium. She has been receiving home health services and they have been applying Medihoney. They have been reporting the wounds as stage II, but on evaluation, the large ulcer was probably unstageable at the time they were evaluating it but it is clearly a stage IV at this point. The smaller ulcer does have fat layer exposure and therefore is a stage III. The patient is accompanied by her daughter. She says she has been sleeping on a regular bed but recently  ordered an air mattress T opper, but does not have it yet. She is on Ozempic for weight loss and therefore has a poor appetite and struggles to get adequate protein intake. 09/15/2022: The large stage IV ulcer is substantially cleaner this week. The stage III ulcer is a little smaller. Both have slough accumulation. The culture that I took last week was polymicrobial. The Augmentin that I prescribed was adequate coverage for the species and she continues to take this. We have also ordered Keystone topical antibiotic compound, but this has not yet arrived. 09/21/2022: The stage IV ulcer has some necrotic muscle at the base but is otherwise fairly clean. The stage III ulcer is smaller again with light slough on the surface. She has her Keystone topical antibiotic compound with her today. 09/29/2022: There is still some necrotic muscle at the base of the stage IV ulcer that I was unable to get to last week. The stage III ulcer has healed. She unfortunately has developed a new pressure ulcer on her right heel. 10/06/2022: Still with some devitalized muscle at the base of the stage IV ulcer, but otherwise this wound is looking quite clean. The pressure induced tissue injury on her heel is demarcating and much of the area appears to be epithelialized, but there is still 2 areas that remain questionable. 10/12/2022: The stage IV ulcer is very clean and ready for wound VAC, which will be delivered tomorrow according to the patient. The tissue injury on her heel is drying up and does not feel particularly boggy this week. 10/19/2022: The heel injury continues to improve. There is still some dry eschar present on the more plantar aspect of it. There is some slough accumulation on the surface of the stage IV pressure ulcer. The wound VAC was initiated by home health last week. 11/01/2022: The gluteal pressure ulcer is very clean without any necrotic  Rarely; Drug Use: Prior History - TCH; Caffeine Use: Daily - soda, tea; Financial Concerns: No; Food, Clothing or Shelter Needs: No; Support System Lacking: No; Transportation Concerns: No Electronic Signature(s) Signed: 05/30/2023 12:00:41 PM By: Duanne Guess MD FACS Entered By: Duanne Guess on 05/30/2023 08:58:10 -------------------------------------------------------------------------------- SuperBill Details Patient Name: Date of Service: [name] 05/30/2023 Medical Record Number: [id number] Patient Account Number: [id number] Date of Birth/Sex: Treating RN: 02/21/1951 (72 y.o. [name] Primary Care Provider: Saralyn Pilar Other Clinician: Referring Provider: Treating Provider/Extender: [name] in Treatment: 37 Diagnosis Coding ICD-10 Codes Code Description L89.314 Pressure ulcer of right buttock, stage 4 I73.00 Raynaud's syndrome without gangrene I50.32 Chronic diastolic (congestive) heart failure N18.32 Chronic kidney disease, stage 3b R73.02 Impaired glucose tolerance (oral) E66.9 Obesity, unspecified Facility Procedures : CPT4 Code: [id number] Description: 99213 - WOUND CARE VISIT-LEV 3 EST PT Modifier: Quantity: 1 Physician Procedures : CPT4 Code Description Modifier [id number] 99214 - WC PHYS LEVEL 4 - EST PT ICD-10 Diagnosis Description  L89.314 Pressure ulcer of right buttock, stage 4 I73.00 Raynaud's syndrome without gangrene I50.32 Chronic diastolic (congestive) heart failure E66.9  Obesity, unspecified Quantity: 1 Electronic Signature(s) Signed: 05/30/2023 12:01:27 PM By: Duanne Guess MD FACS Previous Signature: 05/30/2023 12:00:28 PM Version By: Duanne Guess MD FACS Entered By: Duanne Guess on 05/30/2023 09:01:27  Nystatin) Powder as needed to irritated skin Peri-Wound Care: Zinc Oxide Ointment 30g tube 3 x Per  Week/30 Days Discharge Instructions: Apply Zinc Oxide to periwound with each dressing change Peri-Wound Care: hydrocortisone cream 1% 3 x Per Week/30 Days Discharge Instructions: may apply to rash for itching Prim Dressing: Vashe 3 x Per Week/30 Days ary Discharge Instructions: moisten gauze with Vashe and lightly pack into wound Secondary Dressing: Zetuvit Plus Silicone Border Dressing 5x5 (in/in) 3 x Per Week/30 Days Discharge Instructions: Apply silicone border over primary dressing as directed. 05/30/2023: She has been taking fluconazole and the fungal rash on her buttocks is improving. She still has skin irritation that may be related to the drape adhesive. The wound itself is smaller and shallower with good granulation tissue on the surface. There are no longer any nooks and crannies in the tissue. No debridement was necessary today. We are going to put the [facility] on hold until her next visit in hopes of getting her skin to heal up better. She will complete the oral fluconazole. We will continue to apply zinc oxide and nystatin powder to the periwound. I am just going to have her pack the wound with Vashe- moistened gauze for the next 2 Cooper. We will reassess at that point whether or not to reinstitute negative pressure wound therapy. Electronic Signature(s) Signed: 05/30/2023 12:00:08 PM By: Duanne Guess MD FACS Entered By: Duanne Guess on 05/30/2023 09:00:08 -------------------------------------------------------------------------------- HxROS Details Patient Name: Date of Service: Marisa Roux E. 05/30/2023 11:15 A M Medical Record Number: [id number] Patient Account Number: [id number] Date of Birth/Sex: Treating RN: 22-Aug-1950 (72 y.o. F) Primary Care Provider: Saralyn Pilar Other Clinician: Referring Provider: Treating Provider/Extender: Park Liter in Treatment: 37 Information Obtained From Patient Caregiver Chart Constitutional Symptoms  (General Health) Medical History: Past Medical History Notes: morbid obesity Eyes Medical History: Negative for: Cataracts; Glaucoma; Optic Neuritis Ear/Nose/Mouth/Throat Medical History: Positive for: Chronic sinus problems/congestion - chronic rhinitis Negative for: Middle ear problems Respiratory Medical History: Positive for: Sleep Apnea - uses CPAP Cardiovascular Medical HistoryMarland Kitchen [name], [name] ([id number]) [id number] Page 9 of 10 Positive for: Congestive Heart Failure Past Medical History Notes: hyperlipidemia Gastrointestinal Medical History: Past Medical History Notes: GERD, IBS, eosinophilic esophagitis, diverticulosis, Endocrine Medical History: Negative for: Type I Diabetes; Type II Diabetes Genitourinary Medical History: Negative for: End Stage Renal Disease Past Medical History Notes: CKD stage 3 Immunological Medical History: Positive for: Raynauds Negative for: Lupus Erythematosus; Scleroderma Past Medical History Notes: Sjoegren syndrome, fibromyalgia Integumentary (Skin) Medical History: Negative for: History of Burn Musculoskeletal Medical History: Positive for: Rheumatoid Arthritis Negative for: Gout; Osteoarthritis; Osteomyelitis Neurologic Medical History: Positive for: Neuropathy Negative for: Dementia; Quadriplegia; Paraplegia; Seizure Disorder Oncologic Medical History: Negative for: Received Chemotherapy; Received Radiation Psychiatric Medical History: Negative for: Anorexia/bulimia; Confinement Anxiety HBO Extended History Items Ear/Nose/Mouth/Throat: Chronic sinus problems/congestion Immunizations Pneumococcal Vaccine: Received Pneumococcal Vaccination: Yes Received Pneumococcal Vaccination On or After 60th Birthday: Yes Implantable Devices None Hospitalization / Surgery History Type of Hospitalization/Surgery right femur fx [facility]IR right knee replacement left breast mass excision left ankle tibia  repair abdominal hysterectomy adnoidectomy/tonsillectomy [name], [name] ([id number]) [id number] Page 10 of 10 cholecystectomy dental implants patoid cystectomy tubal ligation Family and Social History Cancer: Yes - Father,Paternal Grandparents; Diabetes: No; Heart Disease: Yes - Mother,Father,Paternal Grandparents; Hereditary Spherocytosis: No; Hypertension: Yes - Mother; Kidney Disease: No; Lung Disease: No; Seizures: No; Stroke: Yes - Maternal Grandparents; Thyroid Problems: No; Tuberculosis: No; Never smoker; Marital Status - Widowed; Alcohol Use:  other debris accumulation. The heel continues to fill in granulation tissue over where the bone had been exposed. There is some slough on the wound surface. 03/07/2023: The gluteal ulcer was measured slightly deeper today, but on palpation, it feels roughly the same. The cavity is  certainly more contracted. She was complaining of more pain in the area and it appears that GranuFoam was applied directly to her skin and she has some irritation from this. The heel ulcer is filling in with excellent granulation tissue. It does not probe to bone any longer. Minimal slough accumulation. 03/21/2023: The gluteal ulcer is shallower and the cavity is tighter. She has some periwound skin breakdown that we have been trying to address by applying DuoDERM to the skin around the wound opening. The home health nurse also added nystatin powder to address some yeasty-looking areas. The heel ulcer is much shallower and smaller. Minimal slough and biofilm buildup. 04/04/2023: The gluteal ulcer continues to get shallower and narrower. The periwound skin looks much better this week. The heel ulcer has a band of epithelialized tissue separating the site into 2 openings. There is very little depth remaining and the wounds are quite clean with minimal slough on the surface. 04/18/2023: The heel ulcer is nearly completely closed. There is just 1 open very superficial area remaining underneath some eschar and slough. The gluteal ulcer still has the same depth at its deepest point, but everything is much narrower and constricted. The wound is clean without any slough. 05/02/2023: The heel ulcer is closed. She has had a recurrence of the rash on her gluteus around the wound. Her home health nurse left the wound VAC off after her visit on Monday because of this. The gluteal ulcer continues to contract and has a very clean surface. 05/16/2023: The gluteal fungal rash has worsened. The patient is complaining of significant itching and discomfort. Her home health nurse has been applying silver alginate to the periwound skin to try and protect it, but she really does not have a whole lot of drainage due to the excellent function of the wound VAC. The wound depth has come in a bit more and the orifice is narrowing  further. 05/30/2023: She has been taking fluconazole and the fungal rash on her buttocks is improving. She still has skin irritation that may be related to the drape adhesive. The wound itself is smaller and shallower with good granulation tissue on the surface. There are no longer any nooks and crannies in the tissue. Electronic Signature(s) Signed: 05/30/2023 11:56:34 AM By: Duanne Guess MD FACS Entered By: Duanne Guess on 05/30/2023 08:56:34 -------------------------------------------------------------------------------- Physical Exam Details Patient Name: Date of Service: [name] 05/30/2023 11:15 A M Medical Record Number: [id number] Patient Account Number: [id number] Date of Birth/Sex: Treating RN: 1950/10/05 (72 y.o. F) Primary Care Provider: Saralyn Pilar Other Clinician: Referring Provider: Treating Provider/Extender: [name] in Treatment: 37 Constitutional . . . . no acute distress. Respiratory Normal work of breathing on room air. [name], [name] ([id number]) [id number] Page 3 of 10 Notes 05/30/2023: She has been taking fluconazole and the fungal rash on her buttocks is improving. She still has skin irritation that may be related to the drape adhesive. The wound itself is smaller and shallower with good granulation tissue on the surface. There are no longer any nooks and crannies in the tissue. Electronic Signature(s) Signed: 05/30/2023 11:58:36 AM By: Duanne Guess MD FACS Entered By: Duanne Guess on 05/30/2023 08:58:36 -------------------------------------------------------------------------------- Physician Orders  Details Patient Name: Date of Service: [name], [name] 05/30/2023 11:15 A M Medical Record Number: [id number] Patient Account Number: [id number] Date of Birth/Sex: Treating RN: March 14, 1951 (72 y.o. [name] Primary Care Provider: Saralyn Pilar Other  Clinician: Referring Provider: Treating Provider/Extender: Park Liter in Treatment: 37 The following information was scribed by: Zenaida Deed The information was scribed for: Duanne Guess Verbal / Phone Orders: No Diagnosis Coding ICD-10 Coding Code Description L89.314 Pressure ulcer of right buttock, stage 4 I73.00 Raynaud's syndrome without gangrene I50.32 Chronic diastolic (congestive) heart failure N18.32 Chronic kidney disease, stage 3b R73.02 Impaired glucose tolerance (oral) E66.9 Obesity, unspecified Follow-up Appointments ppointment in 2 Cooper. - Dr Lady Gary Rm 1 Return A Wed 10/30 @ 12:30 pm Anesthetic Wound #1 Right Gluteus (In clinic) Topical Lidocaine 4% applied to wound bed Bathing/ Shower/ Hygiene May shower and wash wound with soap and water. Negative Presssure Wound Therapy Wound #1 Right Gluteus Wound Vac to wound continuously at 152mm/hg pressure - Hold VAC for next 2 Cooper Black Foam Off-Loading Heel suspension boot to: - globoped right foot to ambulate Low air-loss mattress (Group 2) - Adapt Turn and reposition every 2 hours - avoid lying on back, stand at least every hour while out of bed, float heels off bed with pillows under calves while in bed Prevalon Boot - right foot esp. while in bed. Additional Orders / Instructions Follow Nutritious Diet - increase protein to 70-80 gms per day, hold ozempic Juven Shake 1-2 times daily. Home Health New wound care orders this week; continue Home Health for wound care. May utilize formulary equivalent dressing for wound treatment orders unless otherwise specified. - Hold VAC for next 2 Cooper Other Home Health Orders/Instructions: - [facility] Wound Treatment Wound #1 - Gluteus Wound Laterality: Right [name], [name] ([id number]) [id number] Page 4 of 10 Cleanser: Wound Cleanser 3 x Per Week/30 Days Discharge Instructions: Cleanse the wound with wound  cleanser prior to applying a clean dressing using gauze sponges, not tissue or cotton balls. Peri-Wound Care: Nystop Powder (Nystatin) 3 x Per Week/30 Days Discharge Instructions: Apply Nystop (Nystatin) Powder as needed to irritated skin Peri-Wound Care: Zinc Oxide Ointment 30g tube 3 x Per Week/30 Days Discharge Instructions: Apply Zinc Oxide to periwound with each dressing change Peri-Wound Care: hydrocortisone cream 1% 3 x Per Week/30 Days Discharge Instructions: may apply to rash for itching Prim Dressing: Vashe 3 x Per Week/30 Days ary Discharge Instructions: moisten gauze with Vashe and lightly pack into wound Secondary Dressing: Zetuvit Plus Silicone Border Dressing 5x5 (in/in) 3 x Per Week/30 Days Discharge Instructions: Apply silicone border over primary dressing as directed. Electronic Signature(s) Signed: 05/30/2023 12:00:41 PM By: Duanne Guess MD FACS Entered By: Duanne Guess on 05/30/2023 08:59:06 -------------------------------------------------------------------------------- Problem List Details Patient Name: Date of Service: [name]. 05/30/2023 11:15 A M Medical Record Number: [id number] Patient Account Number: [id number] Date of Birth/Sex: Treating RN: [birth year] (72 y.o. [name] Primary Care Provider: Saralyn Pilar Other Clinician: Referring Provider: Treating Provider/Extender: [name] in Treatment: 37 Active Problems ICD-10 Encounter Code Description Active Date MDM Diagnosis L89.314 Pressure ulcer of right buttock, stage 4 09/07/2022 No Yes I73.00 Raynaud's syndrome without gangrene 09/07/2022 No Yes I50.32 Chronic diastolic (congestive) heart failure 09/07/2022 No Yes N18.32 Chronic kidney disease, stage 3b 09/07/2022 No Yes R73.02 Impaired glucose tolerance (oral) 09/07/2022 No Yes E66.9 Obesity, unspecified 09/07/2022 No Yes Inactive Problems ICD-10 Code Description Active Date Inactive Date [place],  Nystatin) Powder as needed to irritated skin Peri-Wound Care: Zinc Oxide Ointment 30g tube 3 x Per  Week/30 Days Discharge Instructions: Apply Zinc Oxide to periwound with each dressing change Peri-Wound Care: hydrocortisone cream 1% 3 x Per Week/30 Days Discharge Instructions: may apply to rash for itching Prim Dressing: Vashe 3 x Per Week/30 Days ary Discharge Instructions: moisten gauze with Vashe and lightly pack into wound Secondary Dressing: Zetuvit Plus Silicone Border Dressing 5x5 (in/in) 3 x Per Week/30 Days Discharge Instructions: Apply silicone border over primary dressing as directed. 05/30/2023: She has been taking fluconazole and the fungal rash on her buttocks is improving. She still has skin irritation that may be related to the drape adhesive. The wound itself is smaller and shallower with good granulation tissue on the surface. There are no longer any nooks and crannies in the tissue. No debridement was necessary today. We are going to put the [facility] on hold until her next visit in hopes of getting her skin to heal up better. She will complete the oral fluconazole. We will continue to apply zinc oxide and nystatin powder to the periwound. I am just going to have her pack the wound with Vashe- moistened gauze for the next 2 Cooper. We will reassess at that point whether or not to reinstitute negative pressure wound therapy. Electronic Signature(s) Signed: 05/30/2023 12:00:08 PM By: Duanne Guess MD FACS Entered By: Duanne Guess on 05/30/2023 09:00:08 -------------------------------------------------------------------------------- HxROS Details Patient Name: Date of Service: Marisa Roux E. 05/30/2023 11:15 A M Medical Record Number: [id number] Patient Account Number: [id number] Date of Birth/Sex: Treating RN: 22-Aug-1950 (72 y.o. F) Primary Care Provider: Saralyn Pilar Other Clinician: Referring Provider: Treating Provider/Extender: Park Liter in Treatment: 37 Information Obtained From Patient Caregiver Chart Constitutional Symptoms  (General Health) Medical History: Past Medical History Notes: morbid obesity Eyes Medical History: Negative for: Cataracts; Glaucoma; Optic Neuritis Ear/Nose/Mouth/Throat Medical History: Positive for: Chronic sinus problems/congestion - chronic rhinitis Negative for: Middle ear problems Respiratory Medical History: Positive for: Sleep Apnea - uses CPAP Cardiovascular Medical HistoryMarland Kitchen [name], [name] ([id number]) [id number] Page 9 of 10 Positive for: Congestive Heart Failure Past Medical History Notes: hyperlipidemia Gastrointestinal Medical History: Past Medical History Notes: GERD, IBS, eosinophilic esophagitis, diverticulosis, Endocrine Medical History: Negative for: Type I Diabetes; Type II Diabetes Genitourinary Medical History: Negative for: End Stage Renal Disease Past Medical History Notes: CKD stage 3 Immunological Medical History: Positive for: Raynauds Negative for: Lupus Erythematosus; Scleroderma Past Medical History Notes: Sjoegren syndrome, fibromyalgia Integumentary (Skin) Medical History: Negative for: History of Burn Musculoskeletal Medical History: Positive for: Rheumatoid Arthritis Negative for: Gout; Osteoarthritis; Osteomyelitis Neurologic Medical History: Positive for: Neuropathy Negative for: Dementia; Quadriplegia; Paraplegia; Seizure Disorder Oncologic Medical History: Negative for: Received Chemotherapy; Received Radiation Psychiatric Medical History: Negative for: Anorexia/bulimia; Confinement Anxiety HBO Extended History Items Ear/Nose/Mouth/Throat: Chronic sinus problems/congestion Immunizations Pneumococcal Vaccine: Received Pneumococcal Vaccination: Yes Received Pneumococcal Vaccination On or After 60th Birthday: Yes Implantable Devices None Hospitalization / Surgery History Type of Hospitalization/Surgery right femur fx ORIR right knee replacement left breast mass excision left ankle tibia  repair abdominal hysterectomy adnoidectomy/tonsillectomy [name], [name] ([id number]) [id number] Page 10 of 10 cholecystectomy dental implants patoid cystectomy tubal ligation Family and Social History Cancer: Yes - Father,Paternal Grandparents; Diabetes: No; Heart Disease: Yes - Mother,Father,Paternal Grandparents; Hereditary Spherocytosis: No; Hypertension: Yes - Mother; Kidney Disease: No; Lung Disease: No; Seizures: No; Stroke: Yes - Maternal Grandparents; Thyroid Problems: No; Tuberculosis: No; Never smoker; Marital Status - Widowed; Alcohol Use:  Nystatin) Powder as needed to irritated skin Peri-Wound Care: Zinc Oxide Ointment 30g tube 3 x Per  Week/30 Days Discharge Instructions: Apply Zinc Oxide to periwound with each dressing change Peri-Wound Care: hydrocortisone cream 1% 3 x Per Week/30 Days Discharge Instructions: may apply to rash for itching Prim Dressing: Vashe 3 x Per Week/30 Days ary Discharge Instructions: moisten gauze with Vashe and lightly pack into wound Secondary Dressing: Zetuvit Plus Silicone Border Dressing 5x5 (in/in) 3 x Per Week/30 Days Discharge Instructions: Apply silicone border over primary dressing as directed. 05/30/2023: She has been taking fluconazole and the fungal rash on her buttocks is improving. She still has skin irritation that may be related to the drape adhesive. The wound itself is smaller and shallower with good granulation tissue on the surface. There are no longer any nooks and crannies in the tissue. No debridement was necessary today. We are going to put the [facility] on hold until her next visit in hopes of getting her skin to heal up better. She will complete the oral fluconazole. We will continue to apply zinc oxide and nystatin powder to the periwound. I am just going to have her pack the wound with Vashe- moistened gauze for the next 2 Cooper. We will reassess at that point whether or not to reinstitute negative pressure wound therapy. Electronic Signature(s) Signed: 05/30/2023 12:00:08 PM By: Duanne Guess MD FACS Entered By: Duanne Guess on 05/30/2023 09:00:08 -------------------------------------------------------------------------------- HxROS Details Patient Name: Date of Service: Marisa Roux E. 05/30/2023 11:15 A M Medical Record Number: [id number] Patient Account Number: [id number] Date of Birth/Sex: Treating RN: 22-Aug-1950 (72 y.o. F) Primary Care Provider: Saralyn Pilar Other Clinician: Referring Provider: Treating Provider/Extender: Park Liter in Treatment: 37 Information Obtained From Patient Caregiver Chart Constitutional Symptoms  (General Health) Medical History: Past Medical History Notes: morbid obesity Eyes Medical History: Negative for: Cataracts; Glaucoma; Optic Neuritis Ear/Nose/Mouth/Throat Medical History: Positive for: Chronic sinus problems/congestion - chronic rhinitis Negative for: Middle ear problems Respiratory Medical History: Positive for: Sleep Apnea - uses CPAP Cardiovascular Medical HistoryMarland Kitchen [name], [name] ([id number]) [id number] Page 9 of 10 Positive for: Congestive Heart Failure Past Medical History Notes: hyperlipidemia Gastrointestinal Medical History: Past Medical History Notes: GERD, IBS, eosinophilic esophagitis, diverticulosis, Endocrine Medical History: Negative for: Type I Diabetes; Type II Diabetes Genitourinary Medical History: Negative for: End Stage Renal Disease Past Medical History Notes: CKD stage 3 Immunological Medical History: Positive for: Raynauds Negative for: Lupus Erythematosus; Scleroderma Past Medical History Notes: Sjoegren syndrome, fibromyalgia Integumentary (Skin) Medical History: Negative for: History of Burn Musculoskeletal Medical History: Positive for: Rheumatoid Arthritis Negative for: Gout; Osteoarthritis; Osteomyelitis Neurologic Medical History: Positive for: Neuropathy Negative for: Dementia; Quadriplegia; Paraplegia; Seizure Disorder Oncologic Medical History: Negative for: Received Chemotherapy; Received Radiation Psychiatric Medical History: Negative for: Anorexia/bulimia; Confinement Anxiety HBO Extended History Items Ear/Nose/Mouth/Throat: Chronic sinus problems/congestion Immunizations Pneumococcal Vaccine: Received Pneumococcal Vaccination: Yes Received Pneumococcal Vaccination On or After 60th Birthday: Yes Implantable Devices None Hospitalization / Surgery History Type of Hospitalization/Surgery right femur fx ORIR right knee replacement left breast mass excision left ankle tibia  repair abdominal hysterectomy adnoidectomy/tonsillectomy [name], [name] ([id number]) [id number] Page 10 of 10 cholecystectomy dental implants patoid cystectomy tubal ligation Family and Social History Cancer: Yes - Father,Paternal Grandparents; Diabetes: No; Heart Disease: Yes - Mother,Father,Paternal Grandparents; Hereditary Spherocytosis: No; Hypertension: Yes - Mother; Kidney Disease: No; Lung Disease: No; Seizures: No; Stroke: Yes - Maternal Grandparents; Thyroid Problems: No; Tuberculosis: No; Never smoker; Marital Status - Widowed; Alcohol Use:  [place] E ([id number]) [id number] Page 5 of 10 L89.313 Pressure ulcer of right buttock, stage 3 09/07/2022 09/07/2022 Resolved Problems ICD-10 Code Description Active Date Resolved Date L89.614 Pressure ulcer of right heel, stage 4 09/28/2022 09/28/2022 Electronic Signature(s) Signed: 05/30/2023 11:40:48 AM By: Duanne Guess MD FACS Entered By: Duanne Guess on 05/30/2023 08:40:48 -------------------------------------------------------------------------------- Progress Note Details Patient Name: Date of Service: [name]. 05/30/2023 11:15 A M Medical Record Number: [id number] Patient Account Number: [id number] Date of Birth/Sex: Treating RN: 06-Jun-1951 (72 y.o. F) Primary Care Provider: Saralyn Pilar Other Clinician: Referring Provider: Treating Provider/Extender: Park Liter in Treatment: 37 Subjective Chief Complaint Information obtained from Patient Patient is at the clinic for treatment of open pressure ulcers History of Present Illness (HPI) ADMISSION 09/07/2023 This is a 72 year old woman with a past medical history notable for obesity, congestive heart failure, Raynaud's syndrome, CKD stage IIIb, osteoporosis, and rheumatoid arthritis. In November 2023, she suffered a fall that resulted in a femur fracture. She was hospitalized for about a week and underwent surgical repair of the fracture. She subsequently developed pressure ulcers on her right buttocks and ischium. She has been receiving home health services and they have been applying Medihoney. They have been reporting the wounds as stage II, but on evaluation, the large ulcer was probably unstageable at the time they were evaluating it but it is clearly a stage IV at this point. The smaller ulcer does have fat layer exposure and therefore is a stage III. The patient is accompanied by her daughter. She says she has been sleeping on a regular bed but recently  ordered an air mattress T opper, but does not have it yet. She is on Ozempic for weight loss and therefore has a poor appetite and struggles to get adequate protein intake. 09/15/2022: The large stage IV ulcer is substantially cleaner this week. The stage III ulcer is a little smaller. Both have slough accumulation. The culture that I took last week was polymicrobial. The Augmentin that I prescribed was adequate coverage for the species and she continues to take this. We have also ordered Keystone topical antibiotic compound, but this has not yet arrived. 09/21/2022: The stage IV ulcer has some necrotic muscle at the base but is otherwise fairly clean. The stage III ulcer is smaller again with light slough on the surface. She has her Keystone topical antibiotic compound with her today. 09/29/2022: There is still some necrotic muscle at the base of the stage IV ulcer that I was unable to get to last week. The stage III ulcer has healed. She unfortunately has developed a new pressure ulcer on her right heel. 10/06/2022: Still with some devitalized muscle at the base of the stage IV ulcer, but otherwise this wound is looking quite clean. The pressure induced tissue injury on her heel is demarcating and much of the area appears to be epithelialized, but there is still 2 areas that remain questionable. 10/12/2022: The stage IV ulcer is very clean and ready for wound VAC, which will be delivered tomorrow according to the patient. The tissue injury on her heel is drying up and does not feel particularly boggy this week. 10/19/2022: The heel injury continues to improve. There is still some dry eschar present on the more plantar aspect of it. There is some slough accumulation on the surface of the stage IV pressure ulcer. The wound VAC was initiated by home health last week. 11/01/2022: The gluteal pressure ulcer is very clean without any necrotic

## 2023-05-31 NOTE — Progress Notes (Signed)
Reduction in A rea: 46.00% N/A N/A % Reduction in Volume: Category/Stage IV N/A N/A Classification: Medium N/A N/A Exudate A mount: Purulent N/A N/A Exudate Type: yellow, brown, green N/A N/A Exudate Color: Well defined, not attached N/A N/A Wound Margin: Large (67-100%) N/A N/A Granulation A mount: Red N/A N/A Granulation Quality: None Present (0%) N/A N/A Necrotic A mount: Fat Layer (Subcutaneous Tissue): Yes N/A N/A Exposed Structures: Fascia: No Tendon: No Muscle: No Joint: No Bone: No None N/A N/A Epithelialization: Rash: Yes N/A N/A Periwound Skin Texture: Excoriation: No Induration: No Callus: No Crepitus: No Scarring: No Maceration: No N/A N/A Periwound Skin Moisture: Dry/Scaly: No Atrophie Blanche: No N/A N/A Periwound Skin Color: Cyanosis: No Ecchymosis: No Erythema: No Hemosiderin Staining: No Mottled: No Pallor: No Rubor: No No Abnormality N/A N/A Temperature: Yes N/A N/A Tenderness on Palpation: Treatment Notes Electronic Signature(s) Signed: 05/30/2023 11:41:05 AM By: [name] Entered By: Marisa Guess on 05/30/2023 08:41:05 [name] ([id number]) [id number] Page 5 of  8 -------------------------------------------------------------------------------- Multi-Disciplinary Cooper Plan Details Patient Name: Date of Service: [name], [name] 05/30/2023 11:15 A M Medical Record Number: [id number] Patient Account Number: [id number] Date of Birth/Sex: Treating RN: [birth year] (72 y.o. [name] Thomasene Dubow: Saralyn Pilar Other Clinician: Referring Julieanna Geraci: Treating Meelah Tallo/Extender: Park Liter in Treatment: 37 Multidisciplinary Cooper Plan reviewed with physician Active Inactive Wound/Skin Impairment Nursing Diagnoses: Impaired tissue integrity Knowledge deficit related to ulceration/compromised skin integrity Goals: Patient/caregiver will verbalize understanding of skin Cooper regimen Date Initiated: 09/28/2022 Target Resolution Date: 06/27/2023 Goal Status: Active Ulcer/skin breakdown will have a volume reduction of 50% by week 8 Date Initiated: 09/28/2022 Date Inactivated: 11/01/2022 Target Resolution Date: 11/02/2022 Goal Status: Met Ulcer/skin breakdown will have a volume reduction of 80% by week 12 Date Initiated: 11/01/2022 Date Inactivated: 11/29/2022 Target Resolution Date: 11/29/2022 Unmet Reason: vac in place, still Goal Status: Unmet debriding heel Interventions: Assess patient/caregiver ability to obtain necessary supplies Assess patient/caregiver ability to perform ulcer/skin Cooper regimen upon admission and as needed Assess ulceration(s) every visit Provide education on ulcer and skin Cooper Treatment Activities: Skin Cooper regimen initiated : 09/28/2022 Topical wound management initiated : 09/28/2022 Notes: Electronic Signature(s) Signed: 05/30/2023 5:25:43 PM By: [name] Entered By: Marisa Deed on 05/30/2023 08:37:25 -------------------------------------------------------------------------------- Pain Assessment Details Patient Name: Date of Service: [name]  05/30/2023 11:15 A M Medical Record Number: [id number] Patient Account Number: [id number] Date of Birth/Sex: Treating RN: June 16, 1951 (72 y.o. [name] Lewi Drost: Saralyn Pilar Other Clinician: Referring Krishawn Vanderweele: Treating Gerda Yin/Extender: Park Liter in Treatment: [address] ZIANA, HEYLIGER ([id number]) [id number] Page 6 of 8 Location of Pain Severity and Description of Pain Patient Has Paino No Site Locations Rate the pain. Current Pain Level: 0 Pain Management and Medication Current Pain Management: Electronic Signature(s) Signed: 05/30/2023 5:25:43 PM By: [name] Entered By: Marisa Deed on 05/30/2023 08:20:02 -------------------------------------------------------------------------------- Patient/Caregiver Education Details Patient Name: Date of Service: [name] 10/16/2024andnbsp11:15 A M Medical Record Number: [id number] Patient Account Number: [id number] Date of Birth/Gender: Treating RN: November 29, 1950 (72 y.o. [name] Physician: Saralyn Pilar Other Clinician: Referring Physician: Treating Physician/Extender: Park Liter in Treatment: 37 Education Assessment Education Provided To: Patient Education Topics Provided Pressure: Methods: Explain/Verbal Responses: Reinforcements needed, State content correctly Wound/Skin Impairment: Methods: Explain/Verbal Responses: Reinforcements needed, State content correctly Electronic Signature(s) Signed: 05/30/2023 5:25:43 PM By: Marisa Deed RN,  Wound Cleansing - one wound 1 5 []  - 0 Complex Wound Cleansing - multiple wounds X- 1 5 Wound Imaging (photographs - any number of wounds) []  - 0 Wound Tracing (instead of photographs) X- 1 5 Simple Wound Measurement - one wound []  - 0 Complex Wound Measurement - multiple wounds INTERVENTIONS - Wound Dressings X - Small Wound Dressing one or multiple wounds 1 10 []  - 0 Medium Wound Dressing one or multiple wounds []  - 0 Large Wound Dressing one or multiple wounds X- 1 5 Application of Medications - topical []  - 0 Application of Medications - injection INTERVENTIONS - Miscellaneous []  - 0 External ear exam []  - 0 Specimen Collection (cultures, biopsies, blood, body fluids, etc.) []  - 0 Specimen(s) / Culture(s) sent or taken to Lab for analysis []  - 0 Patient Transfer (multiple staff / Nurse, adult / Similar devices) []  - 0 Simple Staple / Suture removal (25 or less) []  - 0 Complex Staple / Suture removal (26 or more) []  - 0 Hypo / Hyperglycemic Management (close monitor of Blood Glucose) ROSANNA, BICKLE E ([id number]) [id number] Page 3 of 8 []  - 0 Ankle / Brachial Index (ABI) - do not check if billed separately X- 1 5 Vital Signs Has the patient been seen at the hospital within the last three years: Yes Total Score: 100 Level Of Cooper: New/Established - Level 3 Electronic Signature(s) Signed: 05/30/2023 5:25:43 PM By: [name] Entered By: Marisa Deed on 05/30/2023 09:00:15 -------------------------------------------------------------------------------- Encounter Discharge Information Details Patient Name: Date of Service: [name] 05/30/2023 11:15 A M Medical Record Number:  [id number] Patient Account Number: [id number] Date of Birth/Sex: Treating RN: 02/28/1951 (72 y.o. [name] Daron Stutz: Saralyn Pilar Other Clinician: Referring Kaari Zeigler: Treating Jessejames Steelman/Extender: Park Liter in Treatment: 37 Encounter Discharge Information Items Discharge Condition: Stable Ambulatory Status: Walker Discharge Destination: Home Transportation: Private Auto Accompanied By: self Schedule Follow-up Appointment: Yes Clinical Summary of Cooper: Patient Declined Electronic Signature(s) Signed: 05/30/2023 5:25:43 PM By: [name] Entered By: Marisa Deed on 05/30/2023 09:02:07 -------------------------------------------------------------------------------- Lower Extremity Assessment Details Patient Name: Date of Service: NORMAGENE, HARVIE 05/30/2023 11:15 A M Medical Record Number: [id number] Patient Account Number: [id number] Date of Birth/Sex: Treating RN: Sep 22, 1950 (72 y.o. [name] Onnie Alatorre: Saralyn Pilar Other Clinician: Referring Nicie Milan: Treating Johnaton Sonneborn/Extender: [name] Weeks in Treatment: 37 Electronic Signature(s) Signed: 05/30/2023 5:25:43 PM By: [name] Entered By: Marisa Deed on 05/30/2023 08:20:11 -------------------------------------------------------------------------------- Multi Wound Chart Details Patient Name: Date of Service: [name] 05/30/2023 11:15 A M Medical Record Number: [id number] Patient Account Number: [id number] AZARA, GEMME ([id number]) [id number].pdf Page 4 of 8 Date of Birth/Sex: Treating RN: [birth year] (72 y.o. F) Primary Cooper Rayansh Herbst: Other Clinician: Saralyn Pilar Referring Sherrelle Prochazka: Treating Kazandra Forstrom/Extender: Park Liter in Treatment: 37 Vital Signs Height(in): 62 Pulse(bpm): 64 Weight(lbs): 207 Blood Pressure(mmHg):  136/55 Body Mass Index(BMI): 37.9 Temperature(F): 97.6 Respiratory Rate(breaths/min): 18 [1:Photos:] [N/A:N/A] Right Gluteus N/A N/A Wound Location: Pressure Injury N/A N/A Wounding Event: Pressure Ulcer N/A N/A Primary Etiology: Chronic sinus problems/congestion, N/A N/A Comorbid History: Sleep Apnea, Congestive Heart Failure, Raynauds, Rheumatoid Arthritis, Neuropathy 08/11/2022 N/A N/A Date Acquired: 34 N/A N/A Weeks of Treatment: Open N/A N/A Wound Status: No N/A N/A Wound Recurrence: 3.3x5x5.5 N/A N/A Measurements L x W x D (cm) 12.959 N/A N/A A (cm) : rea 71.275 N/A N/A Volume (cm) : 41.10% N/A N/A %  Reduction in A rea: 46.00% N/A N/A % Reduction in Volume: Category/Stage IV N/A N/A Classification: Medium N/A N/A Exudate A mount: Purulent N/A N/A Exudate Type: yellow, brown, green N/A N/A Exudate Color: Well defined, not attached N/A N/A Wound Margin: Large (67-100%) N/A N/A Granulation A mount: Red N/A N/A Granulation Quality: None Present (0%) N/A N/A Necrotic A mount: Fat Layer (Subcutaneous Tissue): Yes N/A N/A Exposed Structures: Fascia: No Tendon: No Muscle: No Joint: No Bone: No None N/A N/A Epithelialization: Rash: Yes N/A N/A Periwound Skin Texture: Excoriation: No Induration: No Callus: No Crepitus: No Scarring: No Maceration: No N/A N/A Periwound Skin Moisture: Dry/Scaly: No Atrophie Blanche: No N/A N/A Periwound Skin Color: Cyanosis: No Ecchymosis: No Erythema: No Hemosiderin Staining: No Mottled: No Pallor: No Rubor: No No Abnormality N/A N/A Temperature: Yes N/A N/A Tenderness on Palpation: Treatment Notes Electronic Signature(s) Signed: 05/30/2023 11:41:05 AM By: [name] Entered By: Marisa Guess on 05/30/2023 08:41:05 [name] ([id number]) [id number] Page 5 of  8 -------------------------------------------------------------------------------- Multi-Disciplinary Cooper Plan Details Patient Name: Date of Service: [name], [name] 05/30/2023 11:15 A M Medical Record Number: [id number] Patient Account Number: [id number] Date of Birth/Sex: Treating RN: [birth year] (72 y.o. [name] Thomasene Dubow: Saralyn Pilar Other Clinician: Referring Julieanna Geraci: Treating Meelah Tallo/Extender: Park Liter in Treatment: 37 Multidisciplinary Cooper Plan reviewed with physician Active Inactive Wound/Skin Impairment Nursing Diagnoses: Impaired tissue integrity Knowledge deficit related to ulceration/compromised skin integrity Goals: Patient/caregiver will verbalize understanding of skin Cooper regimen Date Initiated: 09/28/2022 Target Resolution Date: 06/27/2023 Goal Status: Active Ulcer/skin breakdown will have a volume reduction of 50% by week 8 Date Initiated: 09/28/2022 Date Inactivated: 11/01/2022 Target Resolution Date: 11/02/2022 Goal Status: Met Ulcer/skin breakdown will have a volume reduction of 80% by week 12 Date Initiated: 11/01/2022 Date Inactivated: 11/29/2022 Target Resolution Date: 11/29/2022 Unmet Reason: vac in place, still Goal Status: Unmet debriding heel Interventions: Assess patient/caregiver ability to obtain necessary supplies Assess patient/caregiver ability to perform ulcer/skin Cooper regimen upon admission and as needed Assess ulceration(s) every visit Provide education on ulcer and skin Cooper Treatment Activities: Skin Cooper regimen initiated : 09/28/2022 Topical wound management initiated : 09/28/2022 Notes: Electronic Signature(s) Signed: 05/30/2023 5:25:43 PM By: [name] Entered By: Marisa Deed on 05/30/2023 08:37:25 -------------------------------------------------------------------------------- Pain Assessment Details Patient Name: Date of Service: [name]  05/30/2023 11:15 A M Medical Record Number: [id number] Patient Account Number: [id number] Date of Birth/Sex: Treating RN: June 16, 1951 (72 y.o. [name] Lewi Drost: Saralyn Pilar Other Clinician: Referring Krishawn Vanderweele: Treating Gerda Yin/Extender: Park Liter in Treatment: [address] ZIANA, HEYLIGER ([id number]) [id number] Page 6 of 8 Location of Pain Severity and Description of Pain Patient Has Paino No Site Locations Rate the pain. Current Pain Level: 0 Pain Management and Medication Current Pain Management: Electronic Signature(s) Signed: 05/30/2023 5:25:43 PM By: [name] Entered By: Marisa Deed on 05/30/2023 08:20:02 -------------------------------------------------------------------------------- Patient/Caregiver Education Details Patient Name: Date of Service: [name] 10/16/2024andnbsp11:15 A M Medical Record Number: [id number] Patient Account Number: [id number] Date of Birth/Gender: Treating RN: November 29, 1950 (72 y.o. [name] Physician: Saralyn Pilar Other Clinician: Referring Physician: Treating Physician/Extender: Park Liter in Treatment: 37 Education Assessment Education Provided To: Patient Education Topics Provided Pressure: Methods: Explain/Verbal Responses: Reinforcements needed, State content correctly Wound/Skin Impairment: Methods: Explain/Verbal Responses: Reinforcements needed, State content correctly Electronic Signature(s) Signed: 05/30/2023 5:25:43 PM By: Marisa Deed RN,  Wound Cleansing - one wound 1 5 []  - 0 Complex Wound Cleansing - multiple wounds X- 1 5 Wound Imaging (photographs - any number of wounds) []  - 0 Wound Tracing (instead of photographs) X- 1 5 Simple Wound Measurement - one wound []  - 0 Complex Wound Measurement - multiple wounds INTERVENTIONS - Wound Dressings X - Small Wound Dressing one or multiple wounds 1 10 []  - 0 Medium Wound Dressing one or multiple wounds []  - 0 Large Wound Dressing one or multiple wounds X- 1 5 Application of Medications - topical []  - 0 Application of Medications - injection INTERVENTIONS - Miscellaneous []  - 0 External ear exam []  - 0 Specimen Collection (cultures, biopsies, blood, body fluids, etc.) []  - 0 Specimen(s) / Culture(s) sent or taken to Lab for analysis []  - 0 Patient Transfer (multiple staff / Nurse, adult / Similar devices) []  - 0 Simple Staple / Suture removal (25 or less) []  - 0 Complex Staple / Suture removal (26 or more) []  - 0 Hypo / Hyperglycemic Management (close monitor of Blood Glucose) ROSANNA, BICKLE E ([id number]) [id number] Page 3 of 8 []  - 0 Ankle / Brachial Index (ABI) - do not check if billed separately X- 1 5 Vital Signs Has the patient been seen at the hospital within the last three years: Yes Total Score: 100 Level Of Cooper: New/Established - Level 3 Electronic Signature(s) Signed: 05/30/2023 5:25:43 PM By: [name] Entered By: Marisa Deed on 05/30/2023 09:00:15 -------------------------------------------------------------------------------- Encounter Discharge Information Details Patient Name: Date of Service: [name] 05/30/2023 11:15 A M Medical Record Number:  [id number] Patient Account Number: [id number] Date of Birth/Sex: Treating RN: 02/28/1951 (72 y.o. [name] Daron Stutz: Saralyn Pilar Other Clinician: Referring Kaari Zeigler: Treating Jessejames Steelman/Extender: Park Liter in Treatment: 37 Encounter Discharge Information Items Discharge Condition: Stable Ambulatory Status: Walker Discharge Destination: Home Transportation: Private Auto Accompanied By: self Schedule Follow-up Appointment: Yes Clinical Summary of Cooper: Patient Declined Electronic Signature(s) Signed: 05/30/2023 5:25:43 PM By: [name] Entered By: Marisa Deed on 05/30/2023 09:02:07 -------------------------------------------------------------------------------- Lower Extremity Assessment Details Patient Name: Date of Service: NORMAGENE, HARVIE 05/30/2023 11:15 A M Medical Record Number: [id number] Patient Account Number: [id number] Date of Birth/Sex: Treating RN: Sep 22, 1950 (72 y.o. [name] Onnie Alatorre: Saralyn Pilar Other Clinician: Referring Nicie Milan: Treating Johnaton Sonneborn/Extender: [name] Weeks in Treatment: 37 Electronic Signature(s) Signed: 05/30/2023 5:25:43 PM By: [name] Entered By: Marisa Deed on 05/30/2023 08:20:11 -------------------------------------------------------------------------------- Multi Wound Chart Details Patient Name: Date of Service: [name] 05/30/2023 11:15 A M Medical Record Number: [id number] Patient Account Number: [id number] AZARA, GEMME ([id number]) [id number].pdf Page 4 of 8 Date of Birth/Sex: Treating RN: [birth year] (72 y.o. F) Primary Cooper Rayansh Herbst: Other Clinician: Saralyn Pilar Referring Sherrelle Prochazka: Treating Kazandra Forstrom/Extender: Park Liter in Treatment: 37 Vital Signs Height(in): 62 Pulse(bpm): 64 Weight(lbs): 207 Blood Pressure(mmHg):  136/55 Body Mass Index(BMI): 37.9 Temperature(F): 97.6 Respiratory Rate(breaths/min): 18 [1:Photos:] [N/A:N/A] Right Gluteus N/A N/A Wound Location: Pressure Injury N/A N/A Wounding Event: Pressure Ulcer N/A N/A Primary Etiology: Chronic sinus problems/congestion, N/A N/A Comorbid History: Sleep Apnea, Congestive Heart Failure, Raynauds, Rheumatoid Arthritis, Neuropathy 08/11/2022 N/A N/A Date Acquired: 34 N/A N/A Weeks of Treatment: Open N/A N/A Wound Status: No N/A N/A Wound Recurrence: 3.3x5x5.5 N/A N/A Measurements L x W x D (cm) 12.959 N/A N/A A (cm) : rea 71.275 N/A N/A Volume (cm) : 41.10% N/A N/A %

## 2023-06-01 ENCOUNTER — Inpatient Hospital Stay: Payer: PPO

## 2023-06-01 ENCOUNTER — Inpatient Hospital Stay: Payer: PPO | Attending: Oncology | Admitting: Oncology

## 2023-06-01 ENCOUNTER — Encounter: Payer: Self-pay | Admitting: Oncology

## 2023-06-01 VITALS — BP 160/67 | HR 71 | Temp 97.7°F | Resp 14 | Wt 218.0 lb

## 2023-06-01 DIAGNOSIS — E538 Deficiency of other specified B group vitamins: Secondary | ICD-10-CM | POA: Diagnosis not present

## 2023-06-01 DIAGNOSIS — Z79899 Other long term (current) drug therapy: Secondary | ICD-10-CM | POA: Diagnosis not present

## 2023-06-01 DIAGNOSIS — E611 Iron deficiency: Secondary | ICD-10-CM | POA: Diagnosis not present

## 2023-06-01 DIAGNOSIS — L89324 Pressure ulcer of left buttock, stage 4: Secondary | ICD-10-CM

## 2023-06-01 DIAGNOSIS — D649 Anemia, unspecified: Secondary | ICD-10-CM

## 2023-06-01 DIAGNOSIS — I7300 Raynaud's syndrome without gangrene: Secondary | ICD-10-CM | POA: Diagnosis not present

## 2023-06-01 DIAGNOSIS — M069 Rheumatoid arthritis, unspecified: Secondary | ICD-10-CM | POA: Insufficient documentation

## 2023-06-01 DIAGNOSIS — M0579 Rheumatoid arthritis with rheumatoid factor of multiple sites without organ or systems involvement: Secondary | ICD-10-CM | POA: Diagnosis not present

## 2023-06-01 LAB — COMPREHENSIVE METABOLIC PANEL
ALT: 6 U/L (ref 0–44)
AST: 11 U/L — ABNORMAL LOW (ref 15–41)
Albumin: 3.7 g/dL (ref 3.5–5.0)
Alkaline Phosphatase: 81 U/L (ref 38–126)
Anion gap: 7 (ref 5–15)
BUN: 40 mg/dL — ABNORMAL HIGH (ref 8–23)
CO2: 22 mmol/L (ref 22–32)
Calcium: 9.2 mg/dL (ref 8.9–10.3)
Chloride: 110 mmol/L (ref 98–111)
Creatinine, Ser: 1.26 mg/dL — ABNORMAL HIGH (ref 0.44–1.00)
GFR, Estimated: 46 mL/min — ABNORMAL LOW (ref >60)
Glucose, Bld: 106 mg/dL — ABNORMAL HIGH (ref 70–99)
Potassium: 4.3 mmol/L (ref 3.5–5.1)
Sodium: 139 mmol/L (ref 135–145)
Total Bilirubin: 0.2 mg/dL — ABNORMAL LOW (ref 0.3–1.2)
Total Protein: 7.1 g/dL (ref 6.5–8.1)

## 2023-06-01 LAB — RETICULOCYTES
Immature Retic Fract: 16.9 % — ABNORMAL HIGH (ref 2.3–15.9)
RBC.: 3.64 MIL/uL — ABNORMAL LOW (ref 3.87–5.11)
Retic Count, Absolute: 89.9 10*3/uL (ref 19.0–186.0)
Retic Ct Pct: 2.5 % (ref 0.4–3.1)

## 2023-06-01 LAB — IRON AND IRON BINDING CAPACITY (CC-WL,HP ONLY)
Iron: 30 ug/dL (ref 28–170)
Saturation Ratios: 9 % — ABNORMAL LOW (ref 10.4–31.8)
TIBC: 351 ug/dL (ref 250–450)
UIBC: 321 ug/dL (ref 148–442)

## 2023-06-01 LAB — CBC WITH DIFFERENTIAL/PLATELET
Abs Immature Granulocytes: 0.06 10*3/uL (ref 0.00–0.07)
Basophils Absolute: 0.1 10*3/uL (ref 0.0–0.1)
Basophils Relative: 1 %
Eosinophils Absolute: 0.2 10*3/uL (ref 0.0–0.5)
Eosinophils Relative: 3 %
HCT: 31.5 % — ABNORMAL LOW (ref 36.0–46.0)
Hemoglobin: 9.5 g/dL — ABNORMAL LOW (ref 12.0–15.0)
Immature Granulocytes: 1 %
Lymphocytes Relative: 15 %
Lymphs Abs: 1 10*3/uL (ref 0.7–4.0)
MCH: 25.1 pg — ABNORMAL LOW (ref 26.0–34.0)
MCHC: 30.2 g/dL (ref 30.0–36.0)
MCV: 83.1 fL (ref 80.0–100.0)
Monocytes Absolute: 0.5 10*3/uL (ref 0.1–1.0)
Monocytes Relative: 7 %
Neutro Abs: 5 10*3/uL (ref 1.7–7.7)
Neutrophils Relative %: 73 %
Platelets: 289 10*3/uL (ref 150–400)
RBC: 3.79 MIL/uL — ABNORMAL LOW (ref 3.87–5.11)
RDW: 16.4 % — ABNORMAL HIGH (ref 11.5–15.5)
WBC: 6.8 10*3/uL (ref 4.0–10.5)
nRBC: 0 % (ref 0.0–0.2)

## 2023-06-01 LAB — LACTATE DEHYDROGENASE: LDH: 138 U/L (ref 98–192)

## 2023-06-01 LAB — DIRECT ANTIGLOBULIN TEST (NOT AT ARMC)
DAT, IgG: NEGATIVE
DAT, complement: NEGATIVE

## 2023-06-01 LAB — FOLATE: Folate: 5.8 ng/mL — ABNORMAL LOW (ref >5.9)

## 2023-06-01 LAB — VITAMIN B12: Vitamin B-12: 198 pg/mL (ref 180–914)

## 2023-06-01 LAB — FERRITIN: Ferritin: 33 ng/mL (ref 11–307)

## 2023-06-01 MED ORDER — FOLIC ACID 1 MG PO TABS
1.0000 mg | ORAL_TABLET | Freq: Every day | ORAL | 5 refills | Status: DC
Start: 2023-06-01 — End: 2024-05-23

## 2023-06-01 NOTE — Assessment & Plan Note (Addendum)
Stage 4 ulcer with wound vac, currently off due to skin issues. Possible contributor to anemia of chronic disease. -Continue current wound care plan. -Consider impact on anemia and monitor closely.

## 2023-06-01 NOTE — Assessment & Plan Note (Addendum)
New onset since November 2023, possibly secondary to surgery. No active bleeding noted. Possible contributing factors include chronic non-healing wound and rheumatoid arthritis. No signs of blood loss from GI tract or other sources. -Labs today showed hemoglobin of 9.5, hematocrit 31.5, MCV 83.1.  White count 6800 with normal differential.  Platelet count normal at 289,000.  CMP showed BUN of 40, creatinine 1.26, normal LFTs.  B12, LDH normal.  Folate low at 5.8.  Iron saturation decreased at 9%.  Ferritin pending. -I called patient with above-mentioned results.  Started her on folic acid 1 mg daily. -Given normocytic anemia, renal dysfunction, we will pursue additional workup to rule out any underlying monoclonal gammopathy. -Plan to discuss results in two weeks or sooner if urgent intervention needed. -If ferritin comes back low, we will arrange for IV iron infusions with Venofer.

## 2023-06-01 NOTE — Progress Notes (Signed)
[facility]  HEMATOLOGY CLINIC CONSULTATION NOTE    PATIENT NAME: [name]   MR#: [id number] DOB: 01-27-1951  DATE OF SERVICE: 06/01/2023  Patient Care Team: Melida Quitter, PA as PCP - General (Family Medicine) Quintella Reichert, MD as PCP - Cardiology (Cardiology) Pollyann Savoy, MD as Consulting Physician (Rheumatology) Quintella Reichert, MD as Consulting Physician (Cardiology) Ollen Gross, MD as Consulting Physician (Orthopedic Surgery) York Spaniel, MD (Inactive) as Consulting Physician (Neurology) Charna Elizabeth, MD as Consulting Physician (Gastroenterology) [place], Amy, MD as Consulting Physician (Dermatology) Annie Sable, MD as Consulting Physician (Nephrology) Bobbitt, Heywood Iles, MD as Consulting Physician (Allergy and Immunology)  REASON FOR CONSULTATION/ CHIEF COMPLAINT:  Evaluation of anemia.  HISTORY OF PRESENT ILLNESS:  OLUWATOBI SCHINDLER is a 72 y.o. 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    Discussed the use of AI scribe software for clinical note transcription with the patient, who gave verbal consent to proceed.   The patient reports feeling fatigued and cold, which are common symptoms of anemia. The patient's anemia was first noticed in November 2023 following surgery for a fracture. The patient's hemoglobin levels have not returned to normal since the surgery. The patient also reports some issues with feeling full quickly after eating. The patient's RA medications were stopped due to concerns about her wound healing.  On 05/17/2023, labs showed hemoglobin of 9.7, hematocrit 32.3, MCV 82.  White count, platelet count were normal.  Given persistent anemia, referral was sent to [place] for further evaluation.  She had not noticed any recent bleeding such as epistaxis, hematuria or  hematochezia  She had no prior history or diagnosis of cancer. Her age appropriate screening programs are up-to-date.  She denies any pica and eats a variety of diet.   MEDICAL HISTORY:  Past Medical History:  Diagnosis Date   Agatston coronary artery calcium score greater than 400 05/2022   coronary Ca score 856   Back pain    Blood transfusion    1981   Chronic diastolic CHF (congestive heart failure) (HCC)    diastolic    COVID    DDD (degenerative disc disease), cervical    DDD (degenerative disc disease), lumbar    Fibromyalgia    GERD (gastroesophageal reflux disease)    Heart murmur    as a child   Hiatal hernia    sjorgens syndrome   High blood pressure    High cholesterol    IBS (irritable bowel syndrome)    OSA (obstructive sleep apnea)    Osteoarthritis    Peripheral autonomic neuropathy of unknown cause    Pre-diabetes    PVC (premature ventricular contraction)    Raynaud disease    Rheumatoid arthritis (HCC)    Sjogren's disease (HCC)    Urticaria    Vitamin D deficiency     SURGICAL HISTORY: Past Surgical History:  Procedure Laterality Date   ABDOMINAL HYSTERECTOMY     BTL, BSO   ADENOIDECTOMY     BREAST EXCISIONAL BIOPSY Left    BREAST SURGERY     mass removal    CHOLECYSTECTOMY     dental implants     DIAGNOSTIC LAPAROSCOPY     x3   FEMUR FRACTURE SURGERY Right    KNEE ARTHROSCOPY     x2   MASS EXCISION Left 03/22/2017  Procedure: EXCISION OF LEFT BREAST MASS;  Surgeon: Manus Rudd, MD;  Location: [facility];  Service: General;  Laterality: Left;   ORIF FEMUR FRACTURE Right 06/22/2022   Procedure: RIGHT OPEN REDUCTION INTERNAL FIXATION (ORIF) DISTAL FEMUR FRACTURE;  Surgeon: Myrene Galas, MD;  Location: [facility];  Service: Orthopedics;  Laterality: Right;   patotid cystectomy     RIGHT/LEFT HEART CATH AND CORONARY ANGIOGRAPHY N/A 07/25/2019   Procedure: RIGHT/LEFT HEART CATH AND CORONARY ANGIOGRAPHY;  Surgeon: Dolores Patty, MD;   Location: [facility];  Service: Cardiovascular;  Laterality: N/A;   TENDON REPAIR  1980   left ankle and tibia   TONSILLECTOMY     TOTAL KNEE ARTHROPLASTY  06/21/2011   Procedure: TOTAL KNEE ARTHROPLASTY;  Surgeon: Loanne Drilling;  Location: [facility];  Service: Orthopedics;  Laterality: Right;   TUBAL LIGATION      SOCIAL HISTORY: She reports that she has never smoked. She has been exposed to tobacco smoke. She has never used smokeless tobacco. She reports current alcohol use. She reports that she does not use drugs. Social History   Socioeconomic History   Marital status: Widowed    Spouse name: Not on file   Number of children: 2   Years of education: AS   Highest education level: Not on file  Occupational History   Occupation: retired Engineer, civil (consulting)   Occupation: RN  Tobacco Use   Smoking status: Never    Passive exposure: Past   Smokeless tobacco: Never  Vaping Use   Vaping status: Never Used  Substance and Sexual Activity   Alcohol use: Yes    Comment: rarely   Drug use: No   Sexual activity: Never  Other Topics Concern   Not on file  Social History Narrative   Lives at home alone   Right-handed   Drinks 1 or less cups of coffee and 2 servings of either tea or soda per day   Social Determinants of Health   Financial Resource Strain: Low Risk  (12/28/2022)   Overall Financial Resource Strain (CARDIA)    Difficulty of Paying Living Expenses: Not hard at all  Food Insecurity: No Food Insecurity (12/28/2022)   Hunger Vital Sign    Worried About Running Out of Food in the Last Year: Never true    Ran Out of Food in the Last Year: Never true  Transportation Needs: No Transportation Needs (12/28/2022)   PRAPARE - Administrator, Civil Service (Medical): No    Lack of Transportation (Non-Medical): No  Physical Activity: Unknown (12/28/2022)   Exercise Vital Sign    Days of Exercise per Week: Not on file    Minutes of Exercise per Session: 10 min  Stress: No  Stress Concern Present (12/28/2022)   [employer] of Occupational Health - Occupational Stress Questionnaire    Feeling of Stress : Not at all  Social Connections: Moderately Integrated (12/28/2022)   Social Connection and Isolation Panel [NHANES]    Frequency of Communication with Friends and Family: More than three times a week    Frequency of Social Gatherings with Friends and Family: More than three times a week    Attends Religious Services: More than 4 times per year    Active Member of [employer] or Organizations: Yes    Attends Banker Meetings: More than 4 times per year    Marital Status: Widowed  Intimate Partner Violence: Not At Risk (12/28/2022)   Humiliation, Afraid, Rape, and Kick questionnaire  Fear of Current or Ex-Partner: No    Emotionally Abused: No    Physically Abused: No    Sexually Abused: No    FAMILY HISTORY: Family History  Problem Relation Age of Onset   Alzheimer's disease Mother    Heart attack Mother    Hypertension Mother    Glaucoma Mother    High Cholesterol Mother    Heart disease Mother    Depression Mother    Cancer Father    High Cholesterol Father    Heart disease Father    Sleep apnea Father    Breast cancer Maternal Aunt    Heart attack Brother    Heart disease Brother    Glaucoma Brother    Hyperlipidemia Brother    Glaucoma Brother    Asthma Son    Allergic rhinitis Neg Hx    Angioedema Neg Hx    Eczema Neg Hx    Urticaria Neg Hx    Neuropathy Neg Hx     ALLERGIES:  She is allergic to sulfa antibiotics, cymbalta [duloxetine hcl], demerol, ivp dye [iodinated contrast media], morphine and codeine, sulfasalazine, and adhesive [tape].  MEDICATIONS:  Current Outpatient Medications  Medication Sig Dispense Refill   fluconazole (DIFLUCAN) 200 MG tablet Take 200 mg by mouth once a week.     folic acid (FOLVITE) 1 MG tablet Take 1 tablet (1 mg total) by mouth daily. 30 tablet 5   amiodarone (PACERONE) 200 MG tablet  TAKE 1 TABLET (200 MG TOTAL) BY MOUTH DAILY. 90 tablet 3   aspirin EC 81 MG tablet Take 81 mg by mouth at bedtime.     cetirizine (ZYRTEC) 10 MG tablet TAKE 1 TABLET (10 MG TOTAL) BY MOUTH DAILY. 90 tablet 0   colestipol (COLESTID) 1 g tablet Take 2 g by mouth at bedtime.     cycloSPORINE (RESTASIS) 0.05 % ophthalmic emulsion Place 2 drops into both eyes 2 (two) times daily as needed (irritation).     diphenoxylate-atropine (LOMOTIL) 2.5-0.025 MG per tablet Take 1 tablet by mouth 4 (four) times daily as needed for diarrhea or loose stools.     doxylamine, Sleep, (UNISOM) 25 MG tablet Take 1 tablet (25 mg total) by mouth at bedtime as needed. (Patient taking differently: Take 25 mg by mouth at bedtime.) 30 tablet 0   fenofibrate (TRICOR) 48 MG tablet Take 1 tablet (48 mg total) by mouth daily. 90 tablet 1   fluticasone (FLONASE) 50 MCG/ACT nasal spray Place 1 spray into both nostrils daily. (Patient taking differently: Place 1 spray into both nostrils as needed for allergies.) 16 g 0   gabapentin (NEURONTIN) 300 MG capsule Take 2 capsules (600 mg total) by mouth 2 (two) times daily. 360 capsule 1   hydroxychloroquine (PLAQUENIL) 200 MG tablet Take 1 tablet (200 mg total) by mouth daily. (Patient taking differently: Take 200 mg by mouth every Monday, Tuesday, Wednesday, Thursday, and Friday. ON HOLD) 90 tablet 0   hyoscyamine (ANASPAZ) 0.125 MG TBDP disintergrating tablet Place 0.25 mg under the tongue every 6 (six) hours as needed for cramping.      IBU 800 MG tablet TAKE 1 TABLET BY MOUTH EVERY 6 HOURS AS NEEDED FOR MILD PAIN. 90 tablet 1   lidocaine (LIDODERM) 5 % PLACE 1 PATCH ONTO THE SKIN DAILY AS NEEDED FOR PAIN. REMOVE AND DISCARD PATCH WITHIN 12 HOURS OR AS DIRECTED BY MD. (Patient taking differently: Place 1 patch onto the skin 3 (three) times daily as needed (pain).) 90 patch  0   LORazepam (ATIVAN) 0.5 MG tablet TAKE 1 TABLET BY MOUTH ONCE DAILY 1 HOUR PRIOR TO DRESSING CHANGES 30 tablet 1    methocarbamol (ROBAXIN) 500 MG tablet TAKE 1 TABLET BY MOUTH EVERY 6 HOURS AS NEEDED FOR MUSCLE SPASMS. 120 tablet 1   metoprolol succinate (TOPROL-XL) 25 MG 24 hr tablet TAKE 1/2 TABLET BY MOUTH (12.5 MG TOTAL) DAILY 45 tablet 2   Multiple Vitamin (MULTIVITAMIN WITH MINERALS) TABS tablet Take 1 tablet by mouth daily.     NONFORMULARY OR COMPOUNDED ITEM Triamcinolone 0.1% & Silvadene cream 1:1- Apply as directed to affected areas as needed     nystatin (MYCOSTATIN/NYSTOP) powder Apply 1 Application topically 3 (three) times daily. 60 g 1   omeprazole (PRILOSEC) 40 MG capsule Take 40 mg by mouth as needed (nausea).     oxyCODONE (OXY IR/ROXICODONE) 5 MG immediate release tablet Take 1 tablet (5 mg total) by mouth every 6 (six) hours as needed for breakthrough pain ((for MODERATE breakthrough pain)). 30 tablet 0   potassium chloride (KLOR-CON) 10 MEQ tablet Take 2 tablets (20 mEq total) by mouth daily. 180 tablet 3   promethazine (PHENERGAN) 25 MG tablet TAKE 1 TABLET BY MOUTH EVERY 6 HOURS AS NEEDED. 90 tablet 0   rosuvastatin (CRESTOR) 10 MG tablet TAKE 1 TABLET BY MOUTH DAILY. 90 tablet 1   spironolactone (ALDACTONE) 25 MG tablet Take 12.5 mg by mouth daily.     torsemide 40 MG TABS Take 40 mg by mouth daily. 30 tablet 0   No current facility-administered medications for this visit.    REVIEW OF SYSTEMS:    Review of Systems - Oncology  All other pertinent systems were reviewed and were negative except as mentioned above.  PHYSICAL EXAMINATION:  ECOG PERFORMANCE STATUS: 1 - Symptomatic but completely ambulatory  Vitals:   06/01/23 1030  BP: (!) 160/67  Pulse: 71  Resp: 14  Temp: 97.7 F (36.5 C)  SpO2: 100%   Filed Weights   06/01/23 1030  Weight: 218 lb (98.9 kg)    Physical Exam Constitutional:      General: She is not in acute distress.    Appearance: Normal appearance. She is well-developed.  HENT:     Head: Normocephalic and atraumatic.  Eyes:     General: No  scleral icterus.    Extraocular Movements: Extraocular movements intact.     Conjunctiva/sclera: Conjunctivae normal.  Cardiovascular:     Rate and Rhythm: Normal rate and regular rhythm.     Heart sounds: Normal heart sounds.  Pulmonary:     Effort: Pulmonary effort is normal. No respiratory distress.     Breath sounds: Normal breath sounds.  Abdominal:     Tenderness: There is no abdominal tenderness.  Musculoskeletal:     Right lower leg: Edema present.     Left lower leg: Edema present.  Lymphadenopathy:     Cervical: No cervical adenopathy.  Neurological:     General: No focal deficit present.     Mental Status: She is alert and oriented to person, place, and time.  Psychiatric:        Mood and Affect: Mood normal.        Behavior: Behavior normal.        Thought Content: Thought content normal.        Judgment: Judgment normal.      LABORATORY DATA:   I have reviewed the data as listed  Results for orders placed or performed in visit  on 06/01/23  Reticulocytes  Result Value Ref Range   Retic Ct Pct 2.5 0.4 - 3.1 %   RBC. 3.64 (L) 3.87 - 5.11 MIL/uL   Retic Count, Absolute 89.9 19.0 - 186.0 K/uL   Immature Retic Fract 16.9 (H) 2.3 - 15.9 %  Vitamin B12  Result Value Ref Range   Vitamin B-12 198 180 - 914 pg/mL  Iron and Iron Binding Capacity ([facility] only)  Result Value Ref Range   Iron 30 28 - 170 ug/dL   TIBC 540 981 - 191 ug/dL   Saturation Ratios 9 (L) 10.4 - 31.8 %   UIBC 321 148 - 442 ug/dL  Lactate dehydrogenase  Result Value Ref Range   LDH 138 98 - 192 U/L  Folate  Result Value Ref Range   Folate 5.8 (L) >5.9 ng/mL  Comprehensive metabolic panel  Result Value Ref Range   Sodium 139 135 - 145 mmol/L   Potassium 4.3 3.5 - 5.1 mmol/L   Chloride 110 98 - 111 mmol/L   CO2 22 22 - 32 mmol/L   Glucose, Bld 106 (H) 70 - 99 mg/dL   BUN 40 (H) 8 - 23 mg/dL   Creatinine, Ser 4.78 (H) 0.44 - 1.00 mg/dL   Calcium 9.2 8.9 - 29.5 mg/dL   Total Protein  7.1 6.5 - 8.1 g/dL   Albumin 3.7 3.5 - 5.0 g/dL   AST 11 (L) 15 - 41 U/L   ALT 6 0 - 44 U/L   Alkaline Phosphatase 81 38 - 126 U/L   Total Bilirubin 0.2 (L) 0.3 - 1.2 mg/dL   GFR, Estimated 46 (L) >60 mL/min   Anion gap 7 5 - 15  CBC with Differential/Platelet  Result Value Ref Range   WBC 6.8 4.0 - 10.5 K/uL   RBC 3.79 (L) 3.87 - 5.11 MIL/uL   Hemoglobin 9.5 (L) 12.0 - 15.0 g/dL   HCT 62.1 (L) 30.8 - 65.7 %   MCV 83.1 80.0 - 100.0 fL   MCH 25.1 (L) 26.0 - 34.0 pg   MCHC 30.2 30.0 - 36.0 g/dL   RDW 84.6 (H) 96.2 - 95.2 %   Platelets 289 150 - 400 K/uL   nRBC 0.0 0.0 - 0.2 %   Neutrophils Relative % 73 %   Neutro Abs 5.0 1.7 - 7.7 K/uL   Lymphocytes Relative 15 %   Lymphs Abs 1.0 0.7 - 4.0 K/uL   Monocytes Relative 7 %   Monocytes Absolute 0.5 0.1 - 1.0 K/uL   Eosinophils Relative 3 %   Eosinophils Absolute 0.2 0.0 - 0.5 K/uL   Basophils Relative 1 %   Basophils Absolute 0.1 0.0 - 0.1 K/uL   Immature Granulocytes 1 %   Abs Immature Granulocytes 0.06 0.00 - 0.07 K/uL     RADIOGRAPHIC STUDIES:  No pertinent imaging studies to review.   ASSESSMENT & PLAN:   72 y.o. 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    Normocytic anemia New onset since November 2023, possibly secondary to surgery. No active bleeding noted. Possible contributing factors include chronic non-healing wound and rheumatoid arthritis. No signs of blood loss from GI tract or other sources. -Labs today showed hemoglobin of 9.5, hematocrit 31.5, MCV 83.1.  White count 6800 with normal differential.  Platelet count normal at 289,000.  CMP showed BUN of 40, creatinine 1.26, normal  LFTs.  B12, LDH normal.  Folate low at 5.8.  Iron saturation decreased at 9%.  Ferritin pending. -I called patient with above-mentioned results.  Started her on folic acid 1 mg daily. -Given normocytic  anemia, renal dysfunction, we will pursue additional workup to rule out any underlying monoclonal gammopathy. -Plan to discuss results in two weeks or sooner if urgent intervention needed. -If ferritin comes back low, we will arrange for IV iron infusions with Venofer.  Pressure sore of left ischial area, stage IV (HCC) Stage 4 ulcer with wound vac, currently off due to skin issues. Possible contributor to anemia of chronic disease. -Continue current wound care plan. -Consider impact on anemia and monitor closely.   Rheumatoid arthritis (HCC) Diagnosed in the 1990s, currently off all RA medications except for ibuprofen due to concerns about wound healing. Reports increased joint pain since stopping medications. -Continue ibuprofen as tolerated. -Plan to follow up with rheumatologist in November.   Orders Placed This Encounter  Procedures   CBC with Differential/Platelet    Standing Status:   Future    Number of Occurrences:   1    Standing Expiration Date:   05/31/2024   Comprehensive metabolic panel    Standing Status:   Future    Number of Occurrences:   1    Standing Expiration Date:   05/31/2024   Ferritin    Standing Status:   Future    Number of Occurrences:   1    Standing Expiration Date:   05/31/2024   Folate    Standing Status:   Future    Number of Occurrences:   1    Standing Expiration Date:   05/31/2024   Haptoglobin    Standing Status:   Future    Number of Occurrences:   1    Standing Expiration Date:   05/31/2024   Lactate dehydrogenase    Standing Status:   Future    Number of Occurrences:   1    Standing Expiration Date:   05/31/2024   Iron and Iron Binding Capacity ([facility] only)    Standing Status:   Future    Number of Occurrences:   1    Standing Expiration Date:   05/31/2024   Vitamin B12    Standing Status:   Future    Number of Occurrences:   1    Standing Expiration Date:   05/31/2024   Multiple Myeloma Panel (SPEP&IFE w/QIG)    Standing  Status:   Future    Number of Occurrences:   1    Standing Expiration Date:   05/31/2024   Reticulocytes    Standing Status:   Future    Number of Occurrences:   1    Standing Expiration Date:   05/31/2024   Kappa/lambda light chains    Standing Status:   Future    Number of Occurrences:   1    Standing Expiration Date:   05/31/2024   Direct antiglobulin test (not at [facility])    Standing Status:   Future    Number of Occurrences:   1    Standing Expiration Date:   05/31/2024     The total time spent in the appointment was 50 minutes encounter with patients including review of chart and various tests results, discussions about plan of care and coordination of care plan.  I reviewed lab results and outside records for this visit and discussed relevant results with the patient. Diagnosis, plan of care and  treatment options were also discussed in detail with the patient. Opportunity provided to ask questions and answers provided to her apparent satisfaction. Provided instructions to call our clinic with any problems, questions or concerns prior to return visit. I recommended to continue follow-up with PCP and sub-specialists. She verbalized understanding and agreed with the plan. No barriers to learning was detected.   Future Appointments  Date Time Provider Department Center  06/13/2023 12:30 PM Duanne Guess, MD [facility]  06/14/2023  3:15 PM Thaer Miyoshi, Archie Patten, MD [facility] None  06/19/2023 10:30 AM Melida Quitter, PA [facility] None  06/21/2023  1:50 PM Gearldine Bienenstock, PA-C [facility] None  06/27/2023 12:30 PM Duanne Guess, MD [facility]  08/27/2023  3:00 PM [facility] DX DEXA 1 [facility]  08/30/2023  1:00 PM Butch Penny, NP [facility] None  01/08/2024 11:30 AM [facility]-ANNUAL WELLNESS VISIT [facility] None     Meryl Crutch, MD [facility] [address] [place] [address] Dept: [phone] Dept Fax: [phone]   06/01/2023 4:15 PM   This document was completed utilizing speech recognition software. Grammatical errors, random word insertions, pronoun errors, and incomplete sentences are an occasional consequence of this system due to software limitations, ambient noise, and hardware issues. Any formal questions or concerns about the content, text or information contained within the body of this dictation should be directly addressed to the provider for clarification.

## 2023-06-01 NOTE — Assessment & Plan Note (Signed)
Diagnosed in the 1990s, currently off all RA medications except for ibuprofen due to concerns about wound healing. Reports increased joint pain since stopping medications. -Continue ibuprofen as tolerated. -Plan to follow up with rheumatologist in November.

## 2023-06-03 DIAGNOSIS — I5032 Chronic diastolic (congestive) heart failure: Secondary | ICD-10-CM | POA: Diagnosis not present

## 2023-06-03 DIAGNOSIS — L89314 Pressure ulcer of right buttock, stage 4: Secondary | ICD-10-CM | POA: Diagnosis not present

## 2023-06-03 DIAGNOSIS — L89313 Pressure ulcer of right buttock, stage 3: Secondary | ICD-10-CM | POA: Diagnosis not present

## 2023-06-03 DIAGNOSIS — R7302 Impaired glucose tolerance (oral): Secondary | ICD-10-CM | POA: Diagnosis not present

## 2023-06-03 DIAGNOSIS — N1832 Chronic kidney disease, stage 3b: Secondary | ICD-10-CM | POA: Diagnosis not present

## 2023-06-04 LAB — KAPPA/LAMBDA LIGHT CHAINS
Kappa free light chain: 33.9 mg/L — ABNORMAL HIGH (ref 3.3–19.4)
Kappa, lambda light chain ratio: 1.41 (ref 0.26–1.65)
Lambda free light chains: 24 mg/L (ref 5.7–26.3)

## 2023-06-05 ENCOUNTER — Telehealth: Payer: Self-pay

## 2023-06-05 ENCOUNTER — Other Ambulatory Visit: Payer: Self-pay | Admitting: Oncology

## 2023-06-05 ENCOUNTER — Other Ambulatory Visit: Payer: Self-pay

## 2023-06-05 DIAGNOSIS — D509 Iron deficiency anemia, unspecified: Secondary | ICD-10-CM | POA: Insufficient documentation

## 2023-06-05 DIAGNOSIS — D649 Anemia, unspecified: Secondary | ICD-10-CM | POA: Insufficient documentation

## 2023-06-05 DIAGNOSIS — D500 Iron deficiency anemia secondary to blood loss (chronic): Secondary | ICD-10-CM

## 2023-06-05 LAB — HAPTOGLOBIN: Haptoglobin: 337 mg/dL (ref 37–355)

## 2023-06-05 NOTE — Telephone Encounter (Signed)
Dr. Arlana Pouch, patient will be scheduled as soon as possible  Auth Submission: NO AUTH NEEDED Site of care: Site of care: [facility] Payer: [employer] Medication & CPT/J Code(s) submitted: Venofer (Iron Sucrose) J1756 Route of submission (phone, fax, portal):  Phone # Fax # Auth type: Buy/Bill PB Units/visits requested: 200mg  x 5 doses Reference number:  Approval from: 06/05/23 to 08/14/23

## 2023-06-05 NOTE — Progress Notes (Signed)
Patient has mild iron deficiency.  Given persistent anemia, we will replace iron IV, since patient does not tolerate oral iron.  Orders placed for Venofer 200 mg to be given weekly x 5 doses at our infusion center at [facility].  Patient called and informed about this plan and she is agreeable.

## 2023-06-05 NOTE — Telephone Encounter (Signed)
Dr. Arlana Pouch, patient will be scheduled as soon as possible  Auth Submission: NO AUTH NEEDED Site of care: Site of care: [facility] Payer: [employer] Medication & CPT/J Code(s) submitted: Venofer (Iron Sucrose) J1756 Route of submission (phone, fax, portal):  Phone # Fax # Auth type: Buy/Bill PB Units/visits requested: 200mg  x 5 doses Reference number:  Approval from: 06/05/23 to 08/14/23

## 2023-06-08 ENCOUNTER — Other Ambulatory Visit (INDEPENDENT_AMBULATORY_CARE_PROVIDER_SITE_OTHER): Payer: Self-pay | Admitting: Family Medicine

## 2023-06-08 ENCOUNTER — Other Ambulatory Visit: Payer: Self-pay | Admitting: Cardiology

## 2023-06-08 DIAGNOSIS — J3089 Other allergic rhinitis: Secondary | ICD-10-CM

## 2023-06-08 LAB — MULTIPLE MYELOMA PANEL, SERUM
Albumin SerPl Elph-Mcnc: 3.3 g/dL (ref 2.9–4.4)
Albumin/Glob SerPl: 1.1 (ref 0.7–1.7)
Alpha 1: 0.3 g/dL (ref 0.0–0.4)
Alpha2 Glob SerPl Elph-Mcnc: 1 g/dL (ref 0.4–1.0)
B-Globulin SerPl Elph-Mcnc: 0.9 g/dL (ref 0.7–1.3)
Gamma Glob SerPl Elph-Mcnc: 0.9 g/dL (ref 0.4–1.8)
Globulin, Total: 3.2 g/dL (ref 2.2–3.9)
IgA: 230 mg/dL (ref 87–352)
IgG (Immunoglobin G), Serum: 960 mg/dL (ref 586–1602)
IgM (Immunoglobulin M), Srm: 102 mg/dL (ref 26–217)
Total Protein ELP: 6.5 g/dL (ref 6.0–8.5)

## 2023-06-13 ENCOUNTER — Encounter (HOSPITAL_BASED_OUTPATIENT_CLINIC_OR_DEPARTMENT_OTHER): Payer: PPO | Admitting: General Surgery

## 2023-06-13 DIAGNOSIS — R2689 Other abnormalities of gait and mobility: Secondary | ICD-10-CM | POA: Diagnosis not present

## 2023-06-13 DIAGNOSIS — S72409D Unspecified fracture of lower end of unspecified femur, subsequent encounter for closed fracture with routine healing: Secondary | ICD-10-CM | POA: Diagnosis not present

## 2023-06-13 DIAGNOSIS — M6281 Muscle weakness (generalized): Secondary | ICD-10-CM | POA: Diagnosis not present

## 2023-06-13 DIAGNOSIS — L89314 Pressure ulcer of right buttock, stage 4: Secondary | ICD-10-CM | POA: Diagnosis not present

## 2023-06-13 DIAGNOSIS — I5022 Chronic systolic (congestive) heart failure: Secondary | ICD-10-CM | POA: Diagnosis not present

## 2023-06-14 ENCOUNTER — Ambulatory Visit: Payer: PPO | Admitting: *Deleted

## 2023-06-14 ENCOUNTER — Inpatient Hospital Stay: Payer: PPO | Admitting: Oncology

## 2023-06-14 ENCOUNTER — Encounter: Payer: Self-pay | Admitting: Oncology

## 2023-06-14 VITALS — BP 120/75 | HR 58 | Temp 97.8°F | Resp 18 | Ht 62.5 in | Wt 212.0 lb

## 2023-06-14 DIAGNOSIS — N1832 Chronic kidney disease, stage 3b: Secondary | ICD-10-CM | POA: Diagnosis not present

## 2023-06-14 DIAGNOSIS — D500 Iron deficiency anemia secondary to blood loss (chronic): Secondary | ICD-10-CM

## 2023-06-14 DIAGNOSIS — D649 Anemia, unspecified: Secondary | ICD-10-CM

## 2023-06-14 DIAGNOSIS — L89313 Pressure ulcer of right buttock, stage 3: Secondary | ICD-10-CM | POA: Diagnosis not present

## 2023-06-14 DIAGNOSIS — I5032 Chronic diastolic (congestive) heart failure: Secondary | ICD-10-CM | POA: Diagnosis not present

## 2023-06-14 DIAGNOSIS — R7302 Impaired glucose tolerance (oral): Secondary | ICD-10-CM | POA: Diagnosis not present

## 2023-06-14 DIAGNOSIS — L89314 Pressure ulcer of right buttock, stage 4: Secondary | ICD-10-CM | POA: Diagnosis not present

## 2023-06-14 MED ORDER — DIPHENHYDRAMINE HCL 25 MG PO CAPS
25.0000 mg | ORAL_CAPSULE | Freq: Once | ORAL | Status: AC
  Administered 2023-06-14: 25 mg via ORAL

## 2023-06-14 MED ORDER — AMOXICILLIN-POT CLAVULANATE 875-125 MG PO TABS: 1.0000 | ORAL_TABLET | Freq: Two times a day (BID) | ORAL | 0 refills | Status: DC

## 2023-06-14 MED ORDER — IRON SUCROSE 20 MG/ML IV SOLN
200.0000 mg | Freq: Once | INTRAVENOUS | Status: AC
  Administered 2023-06-14: 200 mg via INTRAVENOUS
  Filled 2023-06-14: qty 10

## 2023-06-14 MED ORDER — ACETAMINOPHEN 325 MG PO TABS
650.0000 mg | ORAL_TABLET | Freq: Once | ORAL | Status: AC
  Administered 2023-06-14: 650 mg via ORAL

## 2023-06-14 NOTE — Progress Notes (Signed)
Diagnosis: Iron Deficiency Anemia  Provider:  Chilton Greathouse MD  Procedure: IV Push  IV Type: Peripheral, IV Location: R Antecubital  Venofer (Iron Sucrose), Dose: 200 mg  Post Infusion IV Care: Observation period completed and Peripheral IV Discontinued  Discharge: Condition: Good, Destination: Home . AVS Provided  Performed by:  Forrest Moron, RN

## 2023-06-14 NOTE — Progress Notes (Signed)
[facility]  HEMATOLOGY-ONCOLOGY ELECTRONIC VISIT PROGRESS NOTE  Patient Care Team: Melida Quitter, PA as PCP - General (Family Medicine) Quintella Reichert, MD as PCP - Cardiology (Cardiology) Pollyann Savoy, MD as Consulting Physician (Rheumatology) Quintella Reichert, MD as Consulting Physician (Cardiology) Ollen Gross, MD as Consulting Physician (Orthopedic Surgery) York Spaniel, MD (Inactive) as Consulting Physician (Neurology) Charna Elizabeth, MD as Consulting Physician (Gastroenterology) [place], Amy, MD as Consulting Physician (Dermatology) Annie Sable, MD as Consulting Physician (Nephrology) Bobbitt, Heywood Iles, MD as Consulting Physician (Allergy and Immunology)  I connected with the patient via telephone conference and verified that I am speaking with the correct person using two identifiers. The patient's location is at home and I am providing care from the [facility].  I discussed the limitations, risks, security and privacy concerns of performing an evaluation and management service by e-visits and the availability of in person appointments.  I also discussed with the patient that there may be a patient responsible charge related to this service. The patient expressed understanding and agreed to proceed.   ASSESSMENT & PLAN:   Normocytic anemia New onset since November 2023, possibly secondary to surgery. No active bleeding noted. Possible contributing factors include chronic non-healing wound and rheumatoid arthritis. No signs of blood loss from GI tract or other sources. -On her initial visit with [place] on 06/01/2023, labs showed hemoglobin of 9.5, hematocrit 31.5, MCV 83.1.  White count 6800 with normal differential.  Platelet count normal at 289,000.  CMP showed BUN of 40, creatinine 1.26, normal LFTs.  B12, LDH normal.  Folate low at 5.8.  Iron saturation decreased at 9%.  Ferritin normal.  Workup for monoclonal gammopathy came back  negative.  Started her on folic acid 1 mg daily.  Also given mild iron deficiency as noted by decreased iron saturation, she was started on IV iron with Venofer 200 mg x 5 doses to be given weekly.  For upcoming dental procedure, she was advised to hold aspirin at least 3 days prior to procedure and start taking Augmentin for dental prophylaxis.  Prescription sent to her pharmacy.  Orders Placed This Encounter  Procedures   CBC with Differential/Platelet    Standing Status:   Future    Standing Expiration Date:   06/13/2024   Comprehensive metabolic panel    Standing Status:   Future    Standing Expiration Date:   06/13/2024   Iron and Iron Binding Capacity ([facility] only)    Standing Status:   Future    Standing Expiration Date:   06/13/2024   Ferritin    Standing Status:   Future    Standing Expiration Date:   06/13/2024    INTERVAL HISTORY:  Please see above for problem oriented charting.  The purpose of today's discussion is to explain all the lab results that were done recently.  She received first dose of IV iron today and tolerated it well.  She is scheduled to undergo dental procedure and was asking about precautions.  HEMATOLOGY HISTORY:   72 y.o. 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     Discussed the use of AI scribe software for clinical note transcription with the patient, who gave verbal consent to proceed.    The patient reports feeling fatigued and cold, which are common symptoms of anemia. The patient's anemia was  first noticed in November 2023 following surgery for a fracture. The patient's hemoglobin levels have not returned to normal since the surgery. The patient also reports some issues with feeling full quickly after eating. The patient's RA medications were stopped due to concerns about her wound healing.   On  05/17/2023, labs showed hemoglobin of 9.7, hematocrit 32.3, MCV 82.  White count, platelet count were normal.  Given persistent anemia, referral was sent to [place] for further evaluation.   She had not noticed any recent bleeding such as epistaxis, hematuria or hematochezia   She had no prior history or diagnosis of cancer. Her age appropriate screening programs are up-to-date.  On her initial visit with [place] on 06/01/2023, labs showed hemoglobin of 9.5, hematocrit 31.5, MCV 83.1.  White count 6800 with normal differential.  Platelet count normal at 289,000.  CMP showed BUN of 40, creatinine 1.26, normal LFTs.  B12, LDH normal.  Folate low at 5.8.  Iron saturation decreased at 9%.  Ferritin normal.  Workup for monoclonal gammopathy came back negative.  Started her on folic acid 1 mg daily.  Also given mild iron deficiency as noted by decreased iron saturation, she was started on IV iron with Venofer 200 mg x 5 doses to be given weekly.  REVIEW OF SYSTEMS:    Review of Systems - Oncology  All other pertinent systems were reviewed with the patient and are negative.  I have reviewed the past medical history, past surgical history, social history and family history with the patient and they are unchanged from previous note.  ALLERGIES:  She is allergic to sulfa antibiotics, cymbalta [duloxetine hcl], demerol, ivp dye [iodinated contrast media], morphine and codeine, sulfasalazine, and adhesive [tape].  MEDICATIONS:  Current Outpatient Medications  Medication Sig Dispense Refill   amoxicillin-clavulanate (AUGMENTIN) 875-125 MG tablet Take 1 tablet by mouth 2 (two) times daily. Start 2 days prior to dental procedure 14 tablet 0   amiodarone (PACERONE) 200 MG tablet TAKE 1 TABLET (200 MG TOTAL) BY MOUTH DAILY. 90 tablet 3   aspirin EC 81 MG tablet Take 81 mg by mouth at bedtime.     cetirizine (ZYRTEC) 10 MG tablet TAKE 1 TABLET (10 MG TOTAL) BY MOUTH DAILY. 90 tablet 0   colestipol (COLESTID) 1 g  tablet Take 2 g by mouth at bedtime.     cycloSPORINE (RESTASIS) 0.05 % ophthalmic emulsion Place 2 drops into both eyes 2 (two) times daily as needed (irritation).     diphenoxylate-atropine (LOMOTIL) 2.5-0.025 MG per tablet Take 1 tablet by mouth 4 (four) times daily as needed for diarrhea or loose stools.     doxylamine, Sleep, (UNISOM) 25 MG tablet Take 1 tablet (25 mg total) by mouth at bedtime as needed. (Patient taking differently: Take 25 mg by mouth at bedtime.) 30 tablet 0   fenofibrate (TRICOR) 48 MG tablet Take 1 tablet (48 mg total) by mouth daily. 90 tablet 1   fluconazole (DIFLUCAN) 200 MG tablet Take 200 mg by mouth once a week.     fluticasone (FLONASE) 50 MCG/ACT nasal spray Place 1 spray into both nostrils daily. (Patient taking differently: Place 1 spray into both nostrils as needed for allergies.) 16 g 0   folic acid (FOLVITE) 1 MG tablet Take 1 tablet (1 mg total) by mouth daily. 30 tablet 5   gabapentin (NEURONTIN) 300 MG capsule Take 2 capsules (600 mg total) by mouth 2 (two) times daily. 360 capsule 1   hydroxychloroquine (PLAQUENIL) 200 MG tablet  Take 1 tablet (200 mg total) by mouth daily. (Patient taking differently: Take 200 mg by mouth every Monday, Tuesday, Wednesday, Thursday, and Friday. ON HOLD) 90 tablet 0   hyoscyamine (ANASPAZ) 0.125 MG TBDP disintergrating tablet Place 0.25 mg under the tongue every 6 (six) hours as needed for cramping.      IBU 800 MG tablet TAKE 1 TABLET BY MOUTH EVERY 6 HOURS AS NEEDED FOR MILD PAIN. 90 tablet 1   lidocaine (LIDODERM) 5 % PLACE 1 PATCH ONTO THE SKIN DAILY AS NEEDED FOR PAIN. REMOVE AND DISCARD PATCH WITHIN 12 HOURS OR AS DIRECTED BY MD. (Patient taking differently: Place 1 patch onto the skin 3 (three) times daily as needed (pain).) 90 patch 0   LORazepam (ATIVAN) 0.5 MG tablet TAKE 1 TABLET BY MOUTH ONCE DAILY 1 HOUR PRIOR TO DRESSING CHANGES 30 tablet 1   methocarbamol (ROBAXIN) 500 MG tablet TAKE 1 TABLET BY MOUTH EVERY 6  HOURS AS NEEDED FOR MUSCLE SPASMS. 120 tablet 1   metoprolol succinate (TOPROL-XL) 25 MG 24 hr tablet TAKE 1/2 TABLET BY MOUTH (12.5 MG TOTAL) DAILY 45 tablet 2   Multiple Vitamin (MULTIVITAMIN WITH MINERALS) TABS tablet Take 1 tablet by mouth daily.     NONFORMULARY OR COMPOUNDED ITEM Triamcinolone 0.1% & Silvadene cream 1:1- Apply as directed to affected areas as needed     nystatin (MYCOSTATIN/NYSTOP) powder Apply 1 Application topically 3 (three) times daily. 60 g 1   omeprazole (PRILOSEC) 40 MG capsule Take 40 mg by mouth as needed (nausea).     oxyCODONE (OXY IR/ROXICODONE) 5 MG immediate release tablet Take 1 tablet (5 mg total) by mouth every 6 (six) hours as needed for breakthrough pain ((for MODERATE breakthrough pain)). 30 tablet 0   potassium chloride (KLOR-CON) 10 MEQ tablet Take 2 tablets (20 mEq total) by mouth daily. 180 tablet 3   promethazine (PHENERGAN) 25 MG tablet TAKE 1 TABLET BY MOUTH EVERY 6 HOURS AS NEEDED. 90 tablet 0   rosuvastatin (CRESTOR) 10 MG tablet TAKE 1 TABLET BY MOUTH DAILY. 90 tablet 1   spironolactone (ALDACTONE) 25 MG tablet TAKE 1 TABLET (25 MG TOTAL) BY MOUTH DAILY. 90 tablet 0   torsemide 40 MG TABS Take 40 mg by mouth daily. 30 tablet 0   No current facility-administered medications for this visit.    PHYSICAL EXAMINATION: ECOG PERFORMANCE STATUS: 1 - Symptomatic but completely ambulatory  LABORATORY DATA:   I have reviewed the data as listed     Latest Ref Rng & Units 06/01/2023   11:53 AM 05/17/2023   10:16 AM 03/14/2023   12:00 AM  CMP  Glucose 70 - 99 mg/dL 829  90    BUN 8 - 23 mg/dL 40  30  17      Creatinine 0.44 - 1.00 mg/dL 5.62  1.30  1.0      Sodium 135 - 145 mmol/L 139  137  139      Potassium 3.5 - 5.1 mmol/L 4.3  4.8  3.9      Chloride 98 - 111 mmol/L 110  104  107      CO2 22 - 32 mmol/L 22  20  23       Calcium 8.9 - 10.3 mg/dL 9.2  9.2  9.1      Total Protein 6.5 - 8.1 g/dL 7.1  6.4    Total Bilirubin 0.3 - 1.2 mg/dL 0.2   <8.6    Alkaline Phos 38 - 126 U/L 81  97    AST 15 - 41 U/L 11  12    ALT 0 - 44 U/L 6  6       This result is from an external source.    Lab Results  Component Value Date   WBC 6.8 06/01/2023   HGB 9.5 (L) 06/01/2023   HCT 31.5 (L) 06/01/2023   MCV 83.1 06/01/2023   PLT 289 06/01/2023   NEUTROABS 5.0 06/01/2023     I discussed the assessment and treatment plan with the patient. The patient was provided an opportunity to ask questions and all were answered. The patient agreed with the plan and demonstrated an understanding of the instructions. The patient was advised to call back or seek an in-person evaluation if the symptoms worsen or if the condition fails to improve as anticipated.    I spent 25 minutes for the appointment reviewing test results, discuss management and coordination of care.  Meryl Crutch, MD 06/14/2023 5:01 PM [facility] [address] [place] [address] Dept: [phone] Dept Fax: [phone]

## 2023-06-14 NOTE — Patient Instructions (Signed)
Iron Sucrose Injection What is this medication? IRON SUCROSE (EYE ern SOO krose) treats low levels of iron (iron deficiency anemia) in people with kidney disease. Iron is a mineral that plays an important role in making red blood cells, which carry oxygen from your lungs to the rest of your body. This medicine may be used for other purposes; ask your health care provider or pharmacist if you have questions. COMMON BRAND NAME(S): Venofer What should I tell my care team before I take this medication? They need to know if you have any of these conditions: Anemia not caused by low iron levels Heart disease High levels of iron in the blood Kidney disease Liver disease An unusual or allergic reaction to iron, other medications, foods, dyes, or preservatives Pregnant or trying to get pregnant Breastfeeding How should I use this medication? This medication is for infusion into a vein. It is given in a hospital or clinic setting. Talk to your care team about the use of this medication in children. While this medication may be prescribed for children as young as 2 years for selected conditions, precautions do apply. Overdosage: If you think you have taken too much of this medicine contact a poison control center or emergency room at once. NOTE: This medicine is only for you. Do not share this medicine with others. What if I miss a dose? Keep appointments for follow-up doses. It is important not to miss your dose. Call your care team if you are unable to keep an appointment. What may interact with this medication? Do not take this medication with any of the following: Deferoxamine Dimercaprol Other iron products This medication may also interact with the following: Chloramphenicol Deferasirox This list may not describe all possible interactions. Give your health care provider a list of all the medicines, herbs, non-prescription drugs, or dietary supplements you use. Also tell them if you smoke,  drink alcohol, or use illegal drugs. Some items may interact with your medicine. What should I watch for while using this medication? Visit your care team regularly. Tell your care team if your symptoms do not start to get better or if they get worse. You may need blood work done while you are taking this medication. You may need to follow a special diet. Talk to your care team. Foods that contain iron include: whole grains/cereals, dried fruits, beans, or peas, leafy green vegetables, and organ meats (liver, kidney). What side effects may I notice from receiving this medication? Side effects that you should report to your care team as soon as possible: Allergic reactions--skin rash, itching, hives, swelling of the face, lips, tongue, or throat Low blood pressure--dizziness, feeling faint or lightheaded, blurry vision Shortness of breath Side effects that usually do not require medical attention (report to your care team if they continue or are bothersome): Flushing Headache Joint pain Muscle pain Nausea Pain, redness, or irritation at injection site This list may not describe all possible side effects. Call your doctor for medical advice about side effects. You may report side effects to FDA at 1-800-FDA-1088. Where should I keep my medication? This medication is given in a hospital or clinic. It will not be stored at home. NOTE: This sheet is a summary. It may not cover all possible information. If you have questions about this medicine, talk to your doctor, pharmacist, or health care provider.  2024 Elsevier/Gold Standard (2023-01-05 00:00:00)

## 2023-06-14 NOTE — Assessment & Plan Note (Signed)
New onset since November 2023, possibly secondary to surgery. No active bleeding noted. Possible contributing factors include chronic non-healing wound and rheumatoid arthritis. No signs of blood loss from GI tract or other sources. -On her initial visit with [place] on 06/01/2023, labs showed hemoglobin of 9.5, hematocrit 31.5, MCV 83.1.  White count 6800 with normal differential.  Platelet count normal at 289,000.  CMP showed BUN of 40, creatinine 1.26, normal LFTs.  B12, LDH normal.  Folate low at 5.8.  Iron saturation decreased at 9%.  Ferritin normal.  Workup for monoclonal gammopathy came back negative.  Started her on folic acid 1 mg daily.  Also given mild iron deficiency as noted by decreased iron saturation, she was started on IV iron with Venofer 200 mg x 5 doses to be given weekly.

## 2023-06-15 HISTORY — PX: MULTIPLE TOOTH EXTRACTIONS: SHX2053

## 2023-06-15 NOTE — Progress Notes (Signed)
KASI, LASKY ([id number]) [phone].pdf Page 1 of 7 Visit Report for 06/13/2023 Arrival Information Details Patient Name: Date of Service: [name], [name] 06/13/2023 12:30 PM Medical Record Number: [id number] Patient Account Number: [id number] Date of Birth/Sex: Treating RN: Sep 17, 1950 (72 y.o. [name] Mayte Diers: Saralyn Pilar Other Clinician: Referring Tiffinie Caillier: Treating Ernesto Zukowski/Extender: Park Liter in Treatment: 39 Visit Information History Since Last Visit Added or deleted any medications: Yes Patient Arrived: Walker Any new allergies or adverse reactions: No Arrival Time: 12:33 Had a fall or experienced change in No Accompanied By: self activities of daily living that may affect Transfer Assistance: None risk of falls: Patient Identification Verified: Yes Signs or symptoms of abuse/neglect since last visito No Secondary Verification Process Completed: Yes Hospitalized since last visit: No Patient Requires Transmission-Based Precautions: No Implantable device outside of the clinic excluding No Patient Has Alerts: No cellular tissue based products placed in the center since last visit: Has Dressing in Place as Prescribed: Yes Pain Present Now: No Electronic Signature(s) Signed: 06/14/2023 5:49:41 PM By: Zenaida Deed RN, BSN Entered By: Zenaida Deed on 06/13/2023 09:38:01 -------------------------------------------------------------------------------- Encounter Discharge Information Details Patient Name: Date of Service: [name] Cooper 06/13/2023 12:30 PM Medical Record Number: [id number] Patient Account Number: [id number] Date of Birth/Sex: Treating RN: Sep 25, 1950 (72 y.o. [name] Danae Oland: Saralyn Pilar Other Clinician: Referring Kendale Rembold: Treating Aaliyha Mumford/Extender: Park Liter in Treatment: 39 Encounter Discharge Information  Items Discharge Condition: Stable Ambulatory Status: Walker Discharge Destination: Home Transportation: Private Auto Accompanied By: self Schedule Follow-up Appointment: Yes Clinical Summary of Cooper: Patient Declined Electronic Signature(s) Signed: 06/14/2023 5:49:41 PM By: Zenaida Deed RN, BSN Entered By: Zenaida Deed on 06/13/2023 10:09:54 [name] ([id number]) [id number] Page 2 of 7 -------------------------------------------------------------------------------- Lower Extremity Assessment Details Patient Name: Date of Service: [name] 06/13/2023 12:30 PM Medical Record Number: [id number] Patient Account Number: [id number] Date of Birth/Sex: Treating RN: [birth year] (72 y.o. [name] Deziya Amero: Saralyn Pilar Other Clinician: Referring Kaemon Barnett: Treating Calib Wadhwa/Extender: [name] Weeks in Treatment: 39 Electronic Signature(s) Signed: 06/14/2023 5:49:41 PM By: Zenaida Deed RN, BSN Entered By: Zenaida Deed on 06/13/2023 09:41:06 -------------------------------------------------------------------------------- Multi Wound Chart Details Patient Name: Date of Service: Marisa, Cooper 06/13/2023 12:30 PM Medical Record Number: [id number] Patient Account Number: [id number] Date of Birth/Sex: Treating RN: 25-May-1951 (72 y.o. F) Primary Cooper Kanishk Stroebel: Saralyn Pilar Other Clinician: Referring Ruford Dudzinski: Treating Haylee Mcanany/Extender: Park Liter in Treatment: 39 Vital Signs Height(in): 62 Pulse(bpm): 67 Weight(lbs): 207 Blood Pressure(mmHg): 151/70 Body Mass Index(BMI): 37.9 Temperature(F): 97.9 Respiratory Rate(breaths/min): 20 [1:Photos:] [N/A:N/A] Right Gluteus N/A N/A Wound Location: Pressure Injury N/A N/A Wounding Event: Pressure Ulcer N/A N/A Primary Etiology: Chronic sinus problems/congestion, N/A N/A Comorbid History: Sleep Apnea,  Congestive Heart Failure, Raynauds, Rheumatoid Arthritis, Neuropathy 08/11/2022 N/A N/A Date Acquired: 52 N/A N/A Weeks of Treatment: Open N/A N/A Wound Status: No N/A N/A Wound Recurrence: 2.1x4.3x2.8 N/A N/A Measurements L x W x D (cm) 7.092 N/A N/A A (cm) : rea 19.858 N/A N/A Volume (cm) : 67.80% N/A N/A % Reduction in A rea: 85.00% N/A N/A % Reduction in Volume: Category/Stage IV N/A N/A Classification: Large N/A N/A Exudate A mount: Sanguinous N/A N/A Exudate Type: red N/A N/A Exudate Color: Well defined, not attached N/A N/A Wound Margin: Large (67-100%) N/A N/A Granulation A mountNEEKA, Cooper ([id number]) [id number] Page 3 of 7 Red, Hyper-granulation N/A N/A Granulation Quality:  None Present (0%) N/A N/A Necrotic Amount: Fat Layer (Subcutaneous Tissue): Yes N/A N/A Exposed Structures: Fascia: No Tendon: No Muscle: No Joint: No Bone: No None N/A N/A Epithelialization: Rash: Yes N/A N/A Periwound Skin Texture: Excoriation: No Induration: No Callus: No Crepitus: No Scarring: No Maceration: No N/A N/A Periwound Skin Moisture: Dry/Scaly: No Atrophie Blanche: No N/A N/A Periwound Skin Color: Cyanosis: No Ecchymosis: No Erythema: No Hemosiderin Staining: No Mottled: No Pallor: No Rubor: No No Abnormality N/A N/A Temperature: Yes N/A N/A Tenderness on Palpation: Chemical Cauterization N/A N/A Procedures Performed: Treatment Notes Electronic Signature(s) Signed: 06/13/2023 1:07:35 PM By: Duanne Guess MD FACS Entered By: Duanne Guess on 06/13/2023 10:07:35 -------------------------------------------------------------------------------- Multi-Disciplinary Cooper Plan Details Patient Name: Date of Service: Marisa, Cooper 06/13/2023 12:30 PM Medical Record Number: [id number] Patient Account Number: [id number] Date of Birth/Sex: Treating RN: [birth year] (72 y.o. [name] Corianne Buccellato:  Saralyn Pilar Other Clinician: Referring Jedaiah Rathbun: Treating Mcclellan Demarais/Extender: Park Liter in Treatment: 39 Multidisciplinary Cooper Plan reviewed with physician Active Inactive Wound/Skin Impairment Nursing Diagnoses: Impaired tissue integrity Knowledge deficit related to ulceration/compromised skin integrity Goals: Patient/caregiver will verbalize understanding of skin Cooper regimen Date Initiated: 09/28/2022 Target Resolution Date: 06/27/2023 Goal Status: Active Ulcer/skin breakdown will have a volume reduction of 50% by week 8 Date Initiated: 09/28/2022 Date Inactivated: 11/01/2022 Target Resolution Date: 11/02/2022 Goal Status: Met Ulcer/skin breakdown will have a volume reduction of 80% by week 12 Date Initiated: 11/01/2022 Date Inactivated: 11/29/2022 Target Resolution Date: 11/29/2022 Unmet Reason: vac in place, still Goal Status: Unmet debriding heel Interventions: Assess patient/caregiver ability to obtain necessary supplies Assess patient/caregiver ability to perform ulcer/skin Cooper regimen upon admission and as needed AVAIAH, STEMPEL ([id number]) [id number] Page 4 of 7 Assess ulceration(s) every visit Provide education on ulcer and skin Cooper Treatment Activities: Skin Cooper regimen initiated : 09/28/2022 Topical wound management initiated : 09/28/2022 Notes: Electronic Signature(s) Signed: 06/14/2023 5:49:41 PM By: Zenaida Deed RN, BSN Entered By: Zenaida Deed on 06/13/2023 09:31:32 -------------------------------------------------------------------------------- Pain Assessment Details Patient Name: Date of Service: KHIANA, CAMINO 06/13/2023 12:30 PM Medical Record Number: [id number] Patient Account Number: [id number] Date of Birth/Sex: Treating RN: [birth year] (72 y.o. [name] Charmian Forbis: Saralyn Pilar Other Clinician: Referring Jazlen Ogarro: Treating Opha Mcghee/Extender: Park Liter in Treatment: 39 Active Problems Location of Pain Severity and Description of Pain Patient Has Paino Yes Site Locations Pain Location: Pain in Ulcers With Dressing Change: Yes Duration of the Pain. Constant / Intermittento Intermittent Rate the pain. Current Pain Level: 1 Worst Pain Level: 2 Least Pain Level: 0 Character of Pain Describe the Pain: Aching, Tender Pain Management and Medication Current Pain Management: Medication: Yes Is the Current Pain Management Adequate: Adequate How does your wound impact your activities of daily livingo Sleep: No Bathing: No Appetite: No Relationship With Others: No Bladder Continence: No Emotions: No Bowel Continence: No Work: No Toileting: No Drive: No Dressing: No Hobbies: No Electronic Signature(s) Signed: 06/14/2023 5:49:41 PM By: Zenaida Deed RN, BSN Entered By: Zenaida Deed on 06/13/2023 09:39:59 [name] ([id number]) [id number] Page 5 of 7 -------------------------------------------------------------------------------- Patient/Caregiver Education Details Patient Name: Date of Service: DESHUNDA, THACKSTON 10/30/2024andnbsp12:30 PM Medical Record Number: [id number] Patient Account Number: [id number] Date of Birth/Gender: Treating RN: Aug 07, 1951 (72 y.o. [name] Physician: Saralyn Pilar Other Clinician: Referring Physician: Treating Physician/Extender: Park Liter in Treatment: 67 Education Assessment Education Provided To: Patient Education Topics Provided Pressure:  Methods: Explain/Verbal Responses: Reinforcements needed, State content correctly Wound/Skin Impairment: Methods: Explain/Verbal Responses: Reinforcements needed Electronic Signature(s) Signed: 06/14/2023 5:49:41 PM By: Zenaida Deed RN, BSN Entered By: Zenaida Deed on 06/13/2023  09:31:50 -------------------------------------------------------------------------------- Wound Assessment Details Patient Name: Date of Service: CHRISTYNA, LETENDRE 06/13/2023 12:30 PM Medical Record Number: [id number] Patient Account Number: [id number] Date of Birth/Sex: Treating RN: [birth year] (72 y.o. [name] Vietta Bonifield: Saralyn Pilar Other Clinician: Referring Tujuana Kilmartin: Treating Merrissa Giacobbe/Extender: [name] Weeks in Treatment: 39 Wound Status Wound Number: 1 Primary Pressure Ulcer Etiology: Wound Location: Right Gluteus Wound Open Wounding Event: Pressure Injury Status: Date Acquired: 08/11/2022 Comorbid Chronic sinus problems/congestion, Sleep Apnea, Congestive Heart Weeks Of Treatment: 39 History: Failure, Raynauds, Rheumatoid Arthritis, Neuropathy Clustered Wound: No Photos DASHAWN, GOLDA ([id number]) [phone].pdf Page 6 of 7 Wound Measurements Length: (cm) 2.1 Width: (cm) 4.3 Depth: (cm) 2.8 Area: (cm) 7.092 Volume: (cm) 19.858 % Reduction in Area: 67.8% % Reduction in Volume: 85% Epithelialization: None Tunneling: No Undermining: No Wound Description Classification: Category/Stage IV Wound Margin: Well defined, not attached Exudate Amount: Large Exudate Type: Sanguinous Exudate Color: red Foul Odor After Cleansing: No Slough/Fibrino No Wound Bed Granulation Amount: Large (67-100%) Exposed Structure Granulation Quality: Red, Hyper-granulation Fascia Exposed: No Necrotic Amount: None Present (0%) Fat Layer (Subcutaneous Tissue) Exposed: Yes Tendon Exposed: No Muscle Exposed: No Joint Exposed: No Bone Exposed: No Periwound Skin Texture Texture Color No Abnormalities Noted: No No Abnormalities Noted: Yes Callus: No Temperature / Pain Crepitus: No Temperature: No Abnormality Excoriation: No Tenderness on Palpation: Yes Induration: No Rash: Yes Scarring: No Moisture No  Abnormalities Noted: Yes Treatment Notes Wound #1 (Gluteus) Wound Laterality: Right Cleanser Wound Cleanser Discharge Instruction: Cleanse the wound with wound cleanser prior to applying a clean dressing using gauze sponges, not tissue or cotton balls. Peri-Wound Cooper Nystop Powder (Nystatin) Discharge Instruction: Apply Nystop (Nystatin) Powder as needed to irritated skin Zinc Oxide Ointment 30g tube Discharge Instruction: Apply Zinc Oxide to periwound with each dressing change hydrocortisone cream 1% Discharge Instruction: may apply to rash for itching as needed Topical Primary Dressing Vashe Discharge Instruction: moisten gauze with Vashe and lightly pack into wound Secondary Dressing Zetuvit Plus Silicone Border Dressing 5x5 (in/in) Discharge Instruction: Apply silicone border over primary dressing as directed. Secured With Compression Wrap Compression Stockings Facilities manager) Signed: 06/14/2023 5:49:41 PM By: Zenaida Deed RN, BSN Entered By: Zenaida Deed on 06/13/2023 09:50:20 ASHANTE, YELLIN ([id number]) [id number] Page 7 of 7 -------------------------------------------------------------------------------- Vitals Details Patient Name: Date of Service: JAKELYN, SQUYRES 06/13/2023 12:30 PM Medical Record Number: [id number] Patient Account Number: [id number] Date of Birth/Sex: Treating RN: February 05, 1951 (72 y.o. [name] Pamalee Marcoe: Saralyn Pilar Other Clinician: Referring Jasiah Buntin: Treating Jos Cygan/Extender: [name] Weeks in Treatment: 39 Vital Signs Time Taken: 12:38 Temperature (F): 97.9 Height (in): 62 Pulse (bpm): 67 Weight (lbs): 207 Respiratory Rate (breaths/min): 20 Body Mass Index (BMI): 37.9 Blood Pressure (mmHg): 151/70 Reference Range: 80 - 120 mg / dl Electronic Signature(s) Signed: 06/14/2023 5:49:41 PM By: Zenaida Deed RN, BSN Entered By: Zenaida Deed on 06/13/2023 09:38:29

## 2023-06-15 NOTE — Progress Notes (Signed)
[name], [name] ([id number]) [id number] Page 1 of 11 Visit Report for 06/13/2023 Chief Complaint Document Details Patient Name: Date of Service: [name], [name] 06/13/2023 12:30 PM Medical Record Number: [id number] Patient Account Number: [id number] Date of Birth/Sex: Treating RN: [birth year] (72 y.o. F) Primary Care Provider: Saralyn Pilar Other Clinician: Referring Provider: Treating Provider/Extender: Park Liter in Treatment: 39 Information Obtained from: Patient Chief Complaint Patient is at the clinic for treatment of open pressure ulcers Electronic Signature(s) Signed: 06/13/2023 1:07:41 PM By: Duanne Guess MD FACS Entered By: Duanne Guess on 06/13/2023 10:07:41 -------------------------------------------------------------------------------- HPI Details Patient Name: Date of Service: [name] 06/13/2023 12:30 PM Medical Record Number: [id number] Patient Account Number: [id number] Date of Birth/Sex: Treating RN: 01/26/1951 (72 y.o. F) Primary Care Provider: Saralyn Pilar Other Clinician: Referring Provider: Treating Provider/Extender: Park Liter in Treatment: 39 History of Present Illness HPI Description: ADMISSION 09/07/2023 This is a 72 year old woman with a past medical history notable for obesity, congestive heart failure, Raynaud's syndrome, CKD stage IIIb, osteoporosis, and rheumatoid arthritis. In November 2023, she suffered a fall that resulted in a femur fracture. She was hospitalized for about a week and underwent surgical repair of the fracture. She subsequently developed pressure ulcers on her right buttocks and ischium. She has been receiving home health services and they have been applying Medihoney. They have been reporting the wounds as stage II, but on evaluation, the large ulcer was probably unstageable at the time they were evaluating it but it is clearly a  stage IV at this point. The smaller ulcer does have fat layer exposure and therefore is a stage III. The patient is accompanied by her daughter. She says she has been sleeping on a regular bed but recently ordered an air mattress T opper, but does not have it yet. She is on Ozempic for weight loss and therefore has a poor appetite and struggles to get adequate protein intake. 09/15/2022: The large stage IV ulcer is substantially cleaner this week. The stage III ulcer is a little smaller. Both have slough accumulation. The culture that I took last week was polymicrobial. The Augmentin that I prescribed was adequate coverage for the species and she continues to take this. We have also ordered Keystone topical antibiotic compound, but this has not yet arrived. 09/21/2022: The stage IV ulcer has some necrotic muscle at the base but is otherwise fairly clean. The stage III ulcer is smaller again with light slough on the surface. She has her Keystone topical antibiotic compound with her today. 09/29/2022: There is still some necrotic muscle at the base of the stage IV ulcer that I was unable to get to last week. The stage III ulcer has healed. She unfortunately has developed a new pressure ulcer on her right heel. 10/06/2022: Still with some devitalized muscle at the base of the stage IV ulcer, but otherwise this wound is looking quite clean. The pressure induced tissue injury on her heel is demarcating and much of the area appears to be epithelialized, but there is still 2 areas that remain questionable. 10/12/2022: The stage IV ulcer is very clean and ready for wound VAC, which will be delivered tomorrow according to the patient. The tissue injury on her heel is drying up and does not feel particularly boggy this week. 10/19/2022: The heel injury continues to improve. There is still some dry eschar present on the more plantar aspect of it. There is some slough accumulation on the surface of  the stage IV pressure  ulcer. The wound VAC was initiated by home health last week. 11/01/2022: The gluteal pressure ulcer is very clean without any necrotic tissue or slough. The depth has come in by over half a centimeter. The deep tissue injury on her heel continues to improve. There is still dry eschar that we are painting with Betadine as well as some fresh-looking viable tissue at the margin. [name], [name] ([id number]) [id number] Page 2 of 11 11/15/2022: The gluteal pressure ulcer is clean and contracting. The eschar on her heel ulcer is beginning to separate from the underlying tissue. 11/29/2022: The gluteal pressure ulcer is very clean. The depth has come in by about a centimeter. The eschar has completely separated off of her heel. There is some fibrinous exudate and slough on the heel. It has declared itself as a stage III at this point. 12/13/2022: The heel ulcer is smaller and much cleaner, but still has some non-viable tissue present. The orifice of the gluteal ulcer is contracting, but the depth remains the same. Fortunately, the sponge for the [facility] is being packed appropriately into the full depth of the wound. 12/27/2022: The heel ulcer is smaller, but I think the depth and degree of tissue injury has finally declared itself, with tendon being exposed at the posterior aspect of the ulcer. It is now stage IV. The gluteal ulcer continues to contract circumferentially, but the depth is still unchanged. 01/10/2023: The heel ulcer has contracted further. There is still rubbery slough on the surface. There has been no further tissue breakdown; I think the last of the nonviable tissue was removed at her visit 2 weeks ago. The gluteal ulcer has contracted further circumferentially, but there is still a tunnel that ankles off cranially that remains about the same depth. The tissues appear viable at both sites. 01/24/2023: Unfortunately, the home health nurse that was applying the wound VAC did not  put any drape on her skin for the bridge and was applying sponge directly to the patient's skin. This is resulted in significant tissue breakdown and pain for the patient. Once we were notified of this, we discontinued the wound VAC and they have been packing the wound with saline moistened gauze. The heel has also deteriorated. Bone is now exposed. 01/30/2023: Her skin looks significantly better this week. It has recovered from the insult caused by applying the sponge directly to it. Her heel looks a little bit better as well. The culture that I took last week grew out group A strep, Proteus mirabilis, and Enterococcus, along with other skin flora. Although Levaquin was the recommended antibiotic, she is on amiodarone and therefore Levaquin is contraindicated. She has been taking Augmentin and is showing signs of improvement. 02/07/2023: The insult to her skin caused by direct application of wound VAC sponge has nearly completely healed. The gluteal ulcer cavity continues to contract circumferentially, but still probes quite deeply; I suspect the sponge for her VAC is not getting placed completely down into that tunnel. The heel is looking better today. There is more granulation filling in. I did not feel bone today. 02/14/2023: The gluteal ulcer cavity has circumferentially contracted even further, although the depth remains the same. The heel has improved and there is good tissue overlying where the bone had been exposed. Both wounds have some slough on the surface. 02/21/2023: The gluteal ulcer cavity continues to contract and is about half a centimeter shallower this week. The surface is clean without any slough or other debris  accumulation. The heel continues to fill in granulation tissue over where the bone had been exposed. There is some slough on the wound surface. 03/07/2023: The gluteal ulcer was measured slightly deeper today, but on palpation, it feels roughly the same. The cavity is certainly  more contracted. She was complaining of more pain in the area and it appears that GranuFoam was applied directly to her skin and she has some irritation from this. The heel ulcer is filling in with excellent granulation tissue. It does not probe to bone any longer. Minimal slough accumulation. 03/21/2023: The gluteal ulcer is shallower and the cavity is tighter. She has some periwound skin breakdown that we have been trying to address by applying DuoDERM to the skin around the wound opening. The home health nurse also added nystatin powder to address some yeasty-looking areas. The heel ulcer is much shallower and smaller. Minimal slough and biofilm buildup. 04/04/2023: The gluteal ulcer continues to get shallower and narrower. The periwound skin looks much better this week. The heel ulcer has a band of epithelialized tissue separating the site into 2 openings. There is very little depth remaining and the wounds are quite clean with minimal slough on the surface. 04/18/2023: The heel ulcer is nearly completely closed. There is just 1 open very superficial area remaining underneath some eschar and slough. The gluteal ulcer still has the same depth at its deepest point, but everything is much narrower and constricted. The wound is clean without any slough. 05/02/2023: The heel ulcer is closed. She has had a recurrence of the rash on her gluteus around the wound. Her home health nurse left the wound VAC off after her visit on Monday because of this. The gluteal ulcer continues to contract and has a very clean surface. 05/16/2023: The gluteal fungal rash has worsened. The patient is complaining of significant itching and discomfort. Her home health nurse has been applying silver alginate to the periwound skin to try and protect it, but she really does not have a whole lot of drainage due to the excellent function of the wound VAC. The wound depth has come in a bit more and the orifice is narrowing  further. 05/30/2023: She has been taking fluconazole and the fungal rash on her buttocks is improving. She still has skin irritation that may be related to the drape adhesive. The wound itself is smaller and shallower with good granulation tissue on the surface. There are no longer any nooks and crannies in the tissue. 06/13/2023: The depth of the wound has come in by 50%. She has had a fair amount of drainage, likely attributable to the hypertrophic granulation tissue that has accumulated. Electronic Signature(s) Signed: 06/13/2023 1:09:26 PM By: Duanne Guess MD FACS Entered By: Duanne Guess on 06/13/2023 10:09:26 -------------------------------------------------------------------------------- Chemical Cauterization Details Patient Name: Date of Service: [name], [name] 06/13/2023 12:30 PM Medical Record Number: [id number] Patient Account Number: [id number] Date of Birth/Sex: Treating RN: [birth year] (72 y.o. [name] Primary Care Provider: Saralyn Pilar Other Clinician: Referring Provider: Treating Provider/Extender: Park Liter in Treatment: 40 Procedure Performed for: Wound #1 Right Gluteus Performed By: Physician Duanne Guess, MD [name] ([id number]) [id number] Page 3 of 11 The following information was scribed by: Zenaida Deed The information was scribed for: Duanne Guess Post Procedure Diagnosis Same as Pre-procedure Notes using silver nitrate sticks Electronic Signature(s) Signed: 06/13/2023 5:50:32 PM By: Duanne Guess MD FACS Signed: 06/14/2023 5:49:41 PM By: Zenaida Deed RN, BSN Entered By: Zenaida Deed  on 06/13/2023 09:56:52 -------------------------------------------------------------------------------- Physical Exam Details Patient Name: Date of Service: [name], [name] 06/13/2023 12:30 PM Medical Record Number: [id number] Patient Account Number: [id number] Date of  Birth/Sex: Treating RN: [birth year] (72 y.o. F) Primary Care Provider: Saralyn Pilar Other Clinician: Referring Provider: Treating Provider/Extender: Park Liter in Treatment: 39 Constitutional Hypertensive, asymptomatic. . . . no acute distress. Respiratory Normal work of breathing on room air.. Notes 06/13/2023: The depth of the wound has come in by 50%. She has had a fair amount of drainage, likely attributable to the hypertrophic granulation tissue that has accumulated. Electronic Signature(s) Signed: 06/13/2023 1:10:50 PM By: Duanne Guess MD FACS Entered By: Duanne Guess on 06/13/2023 10:10:50 -------------------------------------------------------------------------------- Physician Orders Details Patient Name: Date of Service: [name], [name] 06/13/2023 12:30 PM Medical Record Number: [id number] Patient Account Number: [id number] Date of Birth/Sex: Treating RN: [birth year] (72 y.o. [name] Primary Care Provider: Saralyn Pilar Other Clinician: Referring Provider: Treating Provider/Extender: Park Liter in Treatment: 39 The following information was scribed by: Zenaida Deed The information was scribed for: Duanne Guess Verbal / Phone Orders: No Diagnosis Coding ICD-10 Coding Code Description L89.314 Pressure ulcer of right buttock, stage 4 I73.00 Raynaud's syndrome without gangrene I50.32 Chronic diastolic (congestive) heart failure [name], [name] ([id number]) [id number] Page 4 of 11 N18.32 Chronic kidney disease, stage 3b R73.02 Impaired glucose tolerance (oral) E66.9 Obesity, unspecified Follow-up Appointments ppointment in 2 weeks. - Dr Lady Gary Rm 1 Return A Wed 10/30 @ 12:30 pm Anesthetic Wound #1 Right Gluteus (In clinic) Topical Lidocaine 4% applied to wound bed Bathing/ Shower/ Hygiene May shower and wash wound with soap and water. Negative  Presssure Wound Therapy Wound #1 Right Gluteus Discontinue wound vac. Call the number on the machine for the company to pick up. Off-Loading Heel suspension boot to: - globoped right foot to ambulate Low air-loss mattress (Group 2) - Adapt Turn and reposition every 2 hours - avoid lying on back, stand at least every hour while out of bed, float heels off bed with pillows under calves while in bed Prevalon Boot - right foot esp. while in bed. Additional Orders / Instructions Follow Nutritious Diet - increase protein to 70-80 gms per day, hold ozempic Juven Shake 1-2 times daily. Home Health New wound care orders this week; continue Home Health for wound care. May utilize formulary equivalent dressing for wound treatment orders unless otherwise specified. - discontinue NPWT Other Home Health Orders/Instructions: - [facility] Wound Treatment Wound #1 - Gluteus Wound Laterality: Right Cleanser: Wound Cleanser 3 x Per Week/30 Days Discharge Instructions: Cleanse the wound with wound cleanser prior to applying a clean dressing using gauze sponges, not tissue or cotton balls. Peri-Wound Care: Nystop Powder (Nystatin) 3 x Per Week/30 Days Discharge Instructions: Apply Nystop (Nystatin) Powder as needed to irritated skin Peri-Wound Care: Zinc Oxide Ointment 30g tube 3 x Per Week/30 Days Discharge Instructions: Apply Zinc Oxide to periwound with each dressing change Peri-Wound Care: hydrocortisone cream 1% 3 x Per Week/30 Days Discharge Instructions: may apply to rash for itching as needed Prim Dressing: Vashe 3 x Per Week/30 Days ary Discharge Instructions: moisten gauze with Vashe and lightly pack into wound Secondary Dressing: Zetuvit Plus Silicone Border Dressing 5x5 (in/in) 3 x Per Week/30 Days Discharge Instructions: Apply silicone border over primary dressing as directed. Electronic Signature(s) Signed: 06/13/2023 5:50:32 PM By: Duanne Guess MD FACS Entered By: Duanne Guess on  06/13/2023 10:12:41 -------------------------------------------------------------------------------- Problem List Details Patient Name: Date of  Service: [name], [name] 06/13/2023 12:30 PM Medical Record Number: [id number] Patient Account Number: [id number] Date of Birth/Sex: Treating RN: 1950/10/28 (72 y.o. [name] Primary Care Provider: Saralyn Pilar Other Clinician: Referring Provider: Treating Provider/Extender: Carianne, Taira ([id number]) [id number] Page 5 of 11 Weeks in Treatment: 39 Active Problems ICD-10 Encounter Code Description Active Date MDM Diagnosis L89.314 Pressure ulcer of right buttock, stage 4 09/07/2022 No Yes I73.00 Raynaud's syndrome without gangrene 09/07/2022 No Yes I50.32 Chronic diastolic (congestive) heart failure 09/07/2022 No Yes N18.32 Chronic kidney disease, stage 3b 09/07/2022 No Yes R73.02 Impaired glucose tolerance (oral) 09/07/2022 No Yes E66.9 Obesity, unspecified 09/07/2022 No Yes Inactive Problems ICD-10 Code Description Active Date Inactive Date L89.313 Pressure ulcer of right buttock, stage 3 09/07/2022 09/07/2022 Resolved Problems ICD-10 Code Description Active Date Resolved Date L89.614 Pressure ulcer of right heel, stage 4 09/28/2022 09/28/2022 Electronic Signature(s) Signed: 06/13/2023 1:07:09 PM By: Duanne Guess MD FACS Entered By: Duanne Guess on 06/13/2023 10:07:08 -------------------------------------------------------------------------------- Progress Note Details Patient Name: Date of Service: [name] 06/13/2023 12:30 PM Medical Record Number: [id number] Patient Account Number: [id number] Date of Birth/Sex: Treating RN: June 15, 1951 (72 y.o. F) Primary Care Provider: Saralyn Pilar Other Clinician: Referring Provider: Treating Provider/Extender: Park Liter in Treatment: 39 Subjective Chief Complaint Information  obtained from Patient ADDA, STOKES ([id number]) [id number] Page 6 of 11 Patient is at the clinic for treatment of open pressure ulcers History of Present Illness (HPI) ADMISSION 09/07/2023 This is a 72 year old woman with a past medical history notable for obesity, congestive heart failure, Raynaud's syndrome, CKD stage IIIb, osteoporosis, and rheumatoid arthritis. In November 2023, she suffered a fall that resulted in a femur fracture. She was hospitalized for about a week and underwent surgical repair of the fracture. She subsequently developed pressure ulcers on her right buttocks and ischium. She has been receiving home health services and they have been applying Medihoney. They have been reporting the wounds as stage II, but on evaluation, the large ulcer was probably unstageable at the time they were evaluating it but it is clearly a stage IV at this point. The smaller ulcer does have fat layer exposure and therefore is a stage III. The patient is accompanied by her daughter. She says she has been sleeping on a regular bed but recently ordered an air mattress T opper, but does not have it yet. She is on Ozempic for weight loss and therefore has a poor appetite and struggles to get adequate protein intake. 09/15/2022: The large stage IV ulcer is substantially cleaner this week. The stage III ulcer is a little smaller. Both have slough accumulation. The culture that I took last week was polymicrobial. The Augmentin that I prescribed was adequate coverage for the species and she continues to take this. We have also ordered Keystone topical antibiotic compound, but this has not yet arrived. 09/21/2022: The stage IV ulcer has some necrotic muscle at the base but is otherwise fairly clean. The stage III ulcer is smaller again with light slough on the surface. She has her Keystone topical antibiotic compound with her today. 09/29/2022: There is still some necrotic muscle at  the base of the stage IV ulcer that I was unable to get to last week. The stage III ulcer has healed. She unfortunately has developed a new pressure ulcer on her right heel. 10/06/2022: Still with some devitalized muscle at the base of the stage IV ulcer, but otherwise this wound is  looking quite clean. The pressure induced tissue injury on her heel is demarcating and much of the area appears to be epithelialized, but there is still 2 areas that remain questionable. 10/12/2022: The stage IV ulcer is very clean and ready for wound VAC, which will be delivered tomorrow according to the patient. The tissue injury on her heel is drying up and does not feel particularly boggy this week. 10/19/2022: The heel injury continues to improve. There is still some dry eschar present on the more plantar aspect of it. There is some slough accumulation on the surface of the stage IV pressure ulcer. The wound VAC was initiated by home health last week. 11/01/2022: The gluteal pressure ulcer is very clean without any necrotic tissue or slough. The depth has come in by over half a centimeter. The deep tissue injury on her heel continues to improve. There is still dry eschar that we are painting with Betadine as well as some fresh-looking viable tissue at the margin. 11/15/2022: The gluteal pressure ulcer is clean and contracting. The eschar on her heel ulcer is beginning to separate from the underlying tissue. 11/29/2022: The gluteal pressure ulcer is very clean. The depth has come in by about a centimeter. The eschar has completely separated off of her heel. There is some fibrinous exudate and slough on the heel. It has declared itself as a stage III at this point. 12/13/2022: The heel ulcer is smaller and much cleaner, but still has some non-viable tissue present. The orifice of the gluteal ulcer is contracting, but the depth remains the same. Fortunately, the sponge for the [facility] is being packed appropriately into the full depth  of the wound. 12/27/2022: The heel ulcer is smaller, but I think the depth and degree of tissue injury has finally declared itself, with tendon being exposed at the posterior aspect of the ulcer. It is now stage IV. The gluteal ulcer continues to contract circumferentially, but the depth is still unchanged. 01/10/2023: The heel ulcer has contracted further. There is still rubbery slough on the surface. There has been no further tissue breakdown; I think the last of the nonviable tissue was removed at her visit 2 weeks ago. The gluteal ulcer has contracted further circumferentially, but there is still a tunnel that ankles off cranially that remains about the same depth. The tissues appear viable at both sites. 01/24/2023: Unfortunately, the home health nurse that was applying the wound VAC did not put any drape on her skin for the bridge and was applying sponge directly to the patient's skin. This is resulted in significant tissue breakdown and pain for the patient. Once we were notified of this, we discontinued the wound VAC and they have been packing the wound with saline moistened gauze. The heel has also deteriorated. Bone is now exposed. 01/30/2023: Her skin looks significantly better this week. It has recovered from the insult caused by applying the sponge directly to it. Her heel looks a little bit better as well. The culture that I took last week grew out group A strep, Proteus mirabilis, and Enterococcus, along with other skin flora. Although Levaquin was the recommended antibiotic, she is on amiodarone and therefore Levaquin is contraindicated. She has been taking Augmentin and is showing signs of improvement. 02/07/2023: The insult to her skin caused by direct application of wound VAC sponge has nearly completely healed. The gluteal ulcer cavity continues to contract circumferentially, but still probes quite deeply; I suspect the sponge for her VAC is not getting placed  completely down into that  tunnel. The heel is looking better today. There is more granulation filling in. I did not feel bone today. 02/14/2023: The gluteal ulcer cavity has circumferentially contracted even further, although the depth remains the same. The heel has improved and there is good tissue overlying where the bone had been exposed. Both wounds have some slough on the surface. 02/21/2023: The gluteal ulcer cavity continues to contract and is about half a centimeter shallower this week. The surface is clean without any slough or other debris accumulation. The heel continues to fill in granulation tissue over where the bone had been exposed. There is some slough on the wound surface. 03/07/2023: The gluteal ulcer was measured slightly deeper today, but on palpation, it feels roughly the same. The cavity is certainly more contracted. She was complaining of more pain in the area and it appears that GranuFoam was applied directly to her skin and she has some irritation from this. The heel ulcer is filling in with excellent granulation tissue. It does not probe to bone any longer. Minimal slough accumulation. 03/21/2023: The gluteal ulcer is shallower and the cavity is tighter. She has some periwound skin breakdown that we have been trying to address by applying DuoDERM to the skin around the wound opening. The home health nurse also added nystatin powder to address some yeasty-looking areas. The heel ulcer is much shallower and smaller. Minimal slough and biofilm buildup. 04/04/2023: The gluteal ulcer continues to get shallower and narrower. The periwound skin looks much better this week. The heel ulcer has a band of epithelialized tissue separating the site into 2 openings. There is very little depth remaining and the wounds are quite clean with minimal slough on the surface. 04/18/2023: The heel ulcer is nearly completely closed. There is just 1 open very superficial area remaining underneath some eschar and slough. The gluteal  ulcer still has the same depth at its deepest point, but everything is much narrower and constricted. The wound is clean without any slough. 05/02/2023: The heel ulcer is closed. She has had a recurrence of the rash on her gluteus around the wound. Her home health nurse left the wound VAC off after her visit on Monday because of this. The gluteal ulcer continues to contract and has a very clean surface. 05/16/2023: The gluteal fungal rash has worsened. The patient is complaining of significant itching and discomfort. Her home health nurse has been applying silver alginate to the periwound skin to try and protect it, but she really does not have a whole lot of drainage due to the excellent function of the wound VAC. The wound depth has come in a bit more and the orifice is narrowing further. 05/30/2023: She has been taking fluconazole and the fungal rash on her buttocks is improving. She still has skin irritation that may be related to the drape [name], [name] ([id number]) [phone].pdf Page 7 of 11 adhesive. The wound itself is smaller and shallower with good granulation tissue on the surface. There are no longer any nooks and crannies in the tissue. 06/13/2023: The depth of the wound has come in by 50%. She has had a fair amount of drainage, likely attributable to the hypertrophic granulation tissue that has accumulated. Patient History Information obtained from Patient, Caregiver, Chart. Family History Cancer - Father,Paternal Grandparents, Heart Disease - Mother,Father,Paternal Grandparents, Hypertension - Mother, Stroke - Maternal Grandparents, No family history of Diabetes, Hereditary Spherocytosis, Kidney Disease, Lung Disease, Seizures, Thyroid Problems, Tuberculosis. Social History Never smoker,  Marital Status - Widowed, Alcohol Use - Rarely, Drug Use - Prior History - TCH, Caffeine Use - Daily - soda, tea. Medical History Eyes Denies history of Cataracts,  Glaucoma, Optic Neuritis Ear/Nose/Mouth/Throat Patient has history of Chronic sinus problems/congestion - chronic rhinitis Denies history of Middle ear problems Respiratory Patient has history of Sleep Apnea - uses CPAP Cardiovascular Patient has history of Congestive Heart Failure Endocrine Denies history of Type I Diabetes, Type II Diabetes Genitourinary Denies history of End Stage Renal Disease Immunological Patient has history of Raynauds Denies history of Lupus Erythematosus, Scleroderma Integumentary (Skin) Denies history of History of Burn Musculoskeletal Patient has history of Rheumatoid Arthritis Denies history of Gout, Osteoarthritis, Osteomyelitis Neurologic Patient has history of Neuropathy Denies history of Dementia, Quadriplegia, Paraplegia, Seizure Disorder Oncologic Denies history of Received Chemotherapy, Received Radiation Psychiatric Denies history of Anorexia/bulimia, Confinement Anxiety Hospitalization/Surgery History - right femur fx ORIR. - right knee replacement. - left breast mass excision. - left ankle tibia repair. - abdominal hysterectomy. - adnoidectomy/tonsillectomy. - cholecystectomy. - dental implants. - patoid cystectomy. - tubal ligation. Medical A Surgical History Notes nd Constitutional Symptoms (General Health) morbid obesity Cardiovascular hyperlipidemia Gastrointestinal GERD, IBS, eosinophilic esophagitis, diverticulosis, Genitourinary CKD stage 3 Immunological Sjoegren syndrome, fibromyalgia Objective Constitutional Hypertensive, asymptomatic. no acute distress. Vitals Time Taken: 12:38 PM, Height: 62 in, Weight: 207 lbs, BMI: 37.9, Temperature: 97.9 F, Pulse: 67 bpm, Respiratory Rate: 20 breaths/min, Blood Pressure: 151/70 mmHg. Respiratory Normal work of breathing on room air.. General Notes: 06/13/2023: The depth of the wound has come in by 50%. She has had a fair amount of drainage, likely attributable to the  hypertrophic granulation tissue that has accumulated. Integumentary (Hair, Skin) Wound #1 status is Open. Original cause of wound was Pressure Injury. The date acquired was: 08/11/2022. The wound has been in treatment 39 weeks. The wound is located on the Right Gluteus. The wound measures 2.1cm length x 4.3cm width x 2.8cm depth; 7.092cm^2 area and 19.858cm^3 volume. There is Fat Layer (Subcutaneous Tissue) exposed. There is no tunneling or undermining noted. There is a large amount of sanguinous drainage noted. The wound margin is [name], BLAYNEY ([id number]) [phone].pdf Page 8 of 11 well defined and not attached to the wound base. There is large (67-100%) red, hyper - granulation within the wound bed. There is no necrotic tissue within the wound bed. The periwound skin appearance had no abnormalities noted for moisture. The periwound skin appearance had no abnormalities noted for color. The periwound skin appearance exhibited: Rash. The periwound skin appearance did not exhibit: Callus, Crepitus, Excoriation, Induration, Scarring. Periwound temperature was noted as No Abnormality. The periwound has tenderness on palpation. Assessment Active Problems ICD-10 Pressure ulcer of right buttock, stage 4 Raynaud's syndrome without gangrene Chronic diastolic (congestive) heart failure Chronic kidney disease, stage 3b Impaired glucose tolerance (oral) Obesity, unspecified Procedures Wound #1 Pre-procedure diagnosis of Wound #1 is a Pressure Ulcer located on the Right Gluteus . An Chemical Cauterization procedure was performed by Duanne Guess, MD. Post procedure Diagnosis Wound #1: Same as Pre-Procedure Notes: using silver nitrate sticks Plan Follow-up Appointments: Return Appointment in 2 weeks. - Dr Lady Gary Rm 1 Wed 10/30 @ 12:30 pm Anesthetic: Wound #1 Right Gluteus: (In clinic) Topical Lidocaine 4% applied to wound bed Bathing/ Shower/ Hygiene: May shower and  wash wound with soap and water. Negative Presssure Wound Therapy: Wound #1 Right Gluteus: Discontinue wound vac. Call the number on the machine for the company to pick up. Off-Loading: Heel suspension boot  to: - globoped right foot to ambulate Low air-loss mattress (Group 2) - Adapt Turn and reposition every 2 hours - avoid lying on back, stand at least every hour while out of bed, float heels off bed with pillows under calves while in bed Prevalon Boot - right foot esp. while in bed. Additional Orders / Instructions: Follow Nutritious Diet - increase protein to 70-80 gms per day, hold ozempic Juven Shake 1-2 times daily. Home Health: New wound care orders this week; continue Home Health for wound care. May utilize formulary equivalent dressing for wound treatment orders unless otherwise specified. - discontinue NPWT Other Home Health Orders/Instructions: - [facility] WOUND #1: - Gluteus Wound Laterality: Right Cleanser: Wound Cleanser 3 x Per Week/30 Days Discharge Instructions: Cleanse the wound with wound cleanser prior to applying a clean dressing using gauze sponges, not tissue or cotton balls. Peri-Wound Care: Nystop Powder (Nystatin) 3 x Per Week/30 Days Discharge Instructions: Apply Nystop (Nystatin) Powder as needed to irritated skin Peri-Wound Care: Zinc Oxide Ointment 30g tube 3 x Per Week/30 Days Discharge Instructions: Apply Zinc Oxide to periwound with each dressing change Peri-Wound Care: hydrocortisone cream 1% 3 x Per Week/30 Days Discharge Instructions: may apply to rash for itching as needed Prim Dressing: Vashe 3 x Per Week/30 Days ary Discharge Instructions: moisten gauze with Vashe and lightly pack into wound Secondary Dressing: Zetuvit Plus Silicone Border Dressing 5x5 (in/in) 3 x Per Week/30 Days Discharge Instructions: Apply silicone border over primary dressing as directed. 06/13/2023: The depth of the wound has come in by 50%. She has had a fair amount of  drainage, likely attributable to the hypertrophic granulation tissue that has accumulated. No debridement was necessary today. I chemically cauterized the hypertrophic granulation tissue with silver nitrate. I think, given the continued improvement of the wound and healing of her periwound skin since she has been out of the wound VAC, we will discontinue using negative pressure wound therapy and continue to pack the wound with Vashe-moistened gauze. Follow-up in 2 weeks. SERYNA, MAREK ([id number]) [id number] Page 9 of 11 Electronic Signature(s) Signed: 06/13/2023 1:13:32 PM By: Duanne Guess MD FACS Entered By: Duanne Guess on 06/13/2023 10:13:32 -------------------------------------------------------------------------------- HxROS Details Patient Name: Date of Service: [name], [name] 06/13/2023 12:30 PM Medical Record Number: [id number] Patient Account Number: [id number] Date of Birth/Sex: Treating RN: 05-04-1951 (72 y.o. F) Primary Care Provider: Saralyn Pilar Other Clinician: Referring Provider: Treating Provider/Extender: Park Liter in Treatment: 39 Information Obtained From Patient Caregiver Chart Constitutional Symptoms (General Health) Medical History: Past Medical History Notes: morbid obesity Eyes Medical History: Negative for: Cataracts; Glaucoma; Optic Neuritis Ear/Nose/Mouth/Throat Medical History: Positive for: Chronic sinus problems/congestion - chronic rhinitis Negative for: Middle ear problems Respiratory Medical History: Positive for: Sleep Apnea - uses CPAP Cardiovascular Medical History: Positive for: Congestive Heart Failure Past Medical History Notes: hyperlipidemia Gastrointestinal Medical History: Past Medical History Notes: GERD, IBS, eosinophilic esophagitis, diverticulosis, Endocrine Medical History: Negative for: Type I Diabetes; Type II Diabetes Genitourinary Medical  History: Negative for: End Stage Renal Disease Past Medical History Notes: CKD stage 3 Immunological Medical History: Positive for: Raynauds Negative for: Lupus Erythematosus; Scleroderma Past Medical History Notes: Sjoegren syndrome, fibromyalgia MARYKATHRYN, CARBONI ([id number]) [id number] Page 10 of 11 Integumentary (Skin) Medical History: Negative for: History of Burn Musculoskeletal Medical History: Positive for: Rheumatoid Arthritis Negative for: Gout; Osteoarthritis; Osteomyelitis Neurologic Medical History: Positive for: Neuropathy Negative for: Dementia; Quadriplegia; Paraplegia; Seizure Disorder Oncologic Medical History: Negative for: Received Chemotherapy; Received Radiation  Psychiatric Medical History: Negative for: Anorexia/bulimia; Confinement Anxiety HBO Extended History Items Ear/Nose/Mouth/Throat: Chronic sinus problems/congestion Immunizations Pneumococcal Vaccine: Received Pneumococcal Vaccination: Yes Received Pneumococcal Vaccination On or After 60th Birthday: Yes Implantable Devices None Hospitalization / Surgery History Type of Hospitalization/Surgery right femur fx ORIR right knee replacement left breast mass excision left ankle tibia repair abdominal hysterectomy adnoidectomy/tonsillectomy cholecystectomy dental implants patoid cystectomy tubal ligation Family and Social History Cancer: Yes - Father,Paternal Grandparents; Diabetes: No; Heart Disease: Yes - Mother,Father,Paternal Grandparents; Hereditary Spherocytosis: No; Hypertension: Yes - Mother; Kidney Disease: No; Lung Disease: No; Seizures: No; Stroke: Yes - Maternal Grandparents; Thyroid Problems: No; Tuberculosis: No; Never smoker; Marital Status - Widowed; Alcohol Use: Rarely; Drug Use: Prior History - TCH; Caffeine Use: Daily - soda, tea; Financial Concerns: No; Food, Clothing or Shelter Needs: No; Support System Lacking: No; Transportation Concerns:  No Psychologist, prison and probation services) Signed: 06/13/2023 5:50:32 PM By: Duanne Guess MD FACS Entered By: Duanne Guess on 06/13/2023 10:10:22 -------------------------------------------------------------------------------- SuperBill Details Patient Name: Date of Service: [name] 06/13/2023 Medical Record Number: [id number] Patient Account Number: [id number] Date of Birth/Sex: Treating RN: 1950/12/02 (72 y.o. MELEENA, MUNROE E ([id number]) [id number] Page 11 of 11 Primary Care Provider: Saralyn Pilar Other Clinician: Referring Provider: Treating Provider/Extender: Park Liter in Treatment: 39 Diagnosis Coding ICD-10 Codes Code Description L89.314 Pressure ulcer of right buttock, stage 4 I73.00 Raynaud's syndrome without gangrene I50.32 Chronic diastolic (congestive) heart failure N18.32 Chronic kidney disease, stage 3b R73.02 Impaired glucose tolerance (oral) E66.9 Obesity, unspecified Facility Procedures : CPT4 Code: 86578469 Description: 17250 - CHEM CAUT GRANULATION TISS ICD-10 Diagnosis Description L89.314 Pressure ulcer of right buttock, stage 4 Modifier: Quantity: 1 Physician Procedures : CPT4 Code Description Modifier [id number] - WC PHYS LEVEL 4 - EST PT ICD-10 Diagnosis Description L89.314 Pressure ulcer of right buttock, stage 4 I50.32 Chronic diastolic (congestive) heart failure E66.9 Obesity, unspecified I73.00 Raynaud's  syndrome without gangrene Quantity: 1 : [id number] 17250 - WC PHYS CHEM CAUT GRAN TISSUE ICD-10 Diagnosis Description L89.314 Pressure ulcer of right buttock, stage 4 Quantity: 1 Electronic Signature(s) Signed: 06/13/2023 1:17:08 PM By: Duanne Guess MD FACS Entered By: Duanne Guess on 06/13/2023 10:17:08

## 2023-06-19 ENCOUNTER — Ambulatory Visit: Payer: PPO | Admitting: Family Medicine

## 2023-06-19 NOTE — Progress Notes (Deleted)
Established Patient Office Visit  Subjective   Patient ID: [name], female    DOB: 1951-06-02  Age: 72 y.o. MRN: [id number]  No chief complaint on file.   HPI TONYA WANTZ is a 72 y.o. female presenting today for follow up of hyperlipidemia, prediabetes, fibromyalgia. Hyperlipidemia: tolerating *** well with no myalgias or significant side effects. Currently consuming a {diet types:17450} diet. {types:19826} The 10-year ASCVD risk score (Arnett DK, et al., 2019) is: 20.9% Prediabetes: denies hypoglycemic events, wounds or sores that are not healing well, increased thirst or urination. Has been following *** diet and *** for exercise.   Outpatient Medications Prior to Visit  Medication Sig   amiodarone (PACERONE) 200 MG tablet TAKE 1 TABLET (200 MG TOTAL) BY MOUTH DAILY.   amoxicillin-clavulanate (AUGMENTIN) 875-125 MG tablet Take 1 tablet by mouth 2 (two) times daily. Start 2 days prior to dental procedure   aspirin EC 81 MG tablet Take 81 mg by mouth at bedtime.   cetirizine (ZYRTEC) 10 MG tablet TAKE 1 TABLET (10 MG TOTAL) BY MOUTH DAILY.   colestipol (COLESTID) 1 g tablet Take 2 g by mouth at bedtime.   cycloSPORINE (RESTASIS) 0.05 % ophthalmic emulsion Place 2 drops into both eyes 2 (two) times daily as needed (irritation).   diphenoxylate-atropine (LOMOTIL) 2.5-0.025 MG per tablet Take 1 tablet by mouth 4 (four) times daily as needed for diarrhea or loose stools.   doxylamine, Sleep, (UNISOM) 25 MG tablet Take 1 tablet (25 mg total) by mouth at bedtime as needed. (Patient taking differently: Take 25 mg by mouth at bedtime.)   fenofibrate (TRICOR) 48 MG tablet Take 1 tablet (48 mg total) by mouth daily.   fluconazole (DIFLUCAN) 200 MG tablet Take 200 mg by mouth once a week.   fluticasone (FLONASE) 50 MCG/ACT nasal spray Place 1 spray into both nostrils daily. (Patient taking differently: Place 1 spray into both nostrils as needed for allergies.)   folic acid (FOLVITE) 1 MG  tablet Take 1 tablet (1 mg total) by mouth daily.   gabapentin (NEURONTIN) 300 MG capsule Take 2 capsules (600 mg total) by mouth 2 (two) times daily.   hydroxychloroquine (PLAQUENIL) 200 MG tablet Take 1 tablet (200 mg total) by mouth daily. (Patient taking differently: Take 200 mg by mouth every Monday, Tuesday, Wednesday, Thursday, and Friday. ON HOLD)   hyoscyamine (ANASPAZ) 0.125 MG TBDP disintergrating tablet Place 0.25 mg under the tongue every 6 (six) hours as needed for cramping.    IBU 800 MG tablet TAKE 1 TABLET BY MOUTH EVERY 6 HOURS AS NEEDED FOR MILD PAIN.   lidocaine (LIDODERM) 5 % PLACE 1 PATCH ONTO THE SKIN DAILY AS NEEDED FOR PAIN. REMOVE AND DISCARD PATCH WITHIN 12 HOURS OR AS DIRECTED BY MD. (Patient taking differently: Place 1 patch onto the skin 3 (three) times daily as needed (pain).)   LORazepam (ATIVAN) 0.5 MG tablet TAKE 1 TABLET BY MOUTH ONCE DAILY 1 HOUR PRIOR TO DRESSING CHANGES   methocarbamol (ROBAXIN) 500 MG tablet TAKE 1 TABLET BY MOUTH EVERY 6 HOURS AS NEEDED FOR MUSCLE SPASMS.   metoprolol succinate (TOPROL-XL) 25 MG 24 hr tablet TAKE 1/2 TABLET BY MOUTH (12.5 MG TOTAL) DAILY   Multiple Vitamin (MULTIVITAMIN WITH MINERALS) TABS tablet Take 1 tablet by mouth daily.   NONFORMULARY OR COMPOUNDED ITEM Triamcinolone 0.1% & Silvadene cream 1:1- Apply as directed to affected areas as needed   nystatin (MYCOSTATIN/NYSTOP) powder Apply 1 Application topically 3 (three) times daily.  omeprazole (PRILOSEC) 40 MG capsule Take 40 mg by mouth as needed (nausea).   oxyCODONE (OXY IR/ROXICODONE) 5 MG immediate release tablet Take 1 tablet (5 mg total) by mouth every 6 (six) hours as needed for breakthrough pain ((for MODERATE breakthrough pain)).   potassium chloride (KLOR-CON) 10 MEQ tablet Take 2 tablets (20 mEq total) by mouth daily.   promethazine (PHENERGAN) 25 MG tablet TAKE 1 TABLET BY MOUTH EVERY 6 HOURS AS NEEDED.   rosuvastatin (CRESTOR) 10 MG tablet TAKE 1 TABLET BY  MOUTH DAILY.   spironolactone (ALDACTONE) 25 MG tablet TAKE 1 TABLET (25 MG TOTAL) BY MOUTH DAILY.   torsemide 40 MG TABS Take 40 mg by mouth daily.   No facility-administered medications prior to visit.    ROS Negative unless otherwise noted in HPI   Objective:     There were no vitals taken for this visit.  Physical Exam   No results found for any visits on 06/19/23.   Assessment & Plan:  There are no diagnoses linked to this encounter.  No follow-ups on file.    Melida Quitter, PA

## 2023-06-21 ENCOUNTER — Ambulatory Visit: Payer: PPO

## 2023-06-21 ENCOUNTER — Ambulatory Visit: Payer: PPO | Admitting: Physician Assistant

## 2023-06-21 VITALS — BP 120/55 | HR 56 | Temp 98.1°F | Resp 16 | Ht 62.0 in | Wt 212.8 lb

## 2023-06-21 DIAGNOSIS — D500 Iron deficiency anemia secondary to blood loss (chronic): Secondary | ICD-10-CM

## 2023-06-21 DIAGNOSIS — D649 Anemia, unspecified: Secondary | ICD-10-CM

## 2023-06-21 MED ORDER — IRON SUCROSE 20 MG/ML IV SOLN
200.0000 mg | Freq: Once | INTRAVENOUS | Status: AC
  Administered 2023-06-21: 200 mg via INTRAVENOUS
  Filled 2023-06-21: qty 10

## 2023-06-21 MED ORDER — ACETAMINOPHEN 325 MG PO TABS
650.0000 mg | ORAL_TABLET | Freq: Once | ORAL | Status: AC
  Administered 2023-06-21: 650 mg via ORAL
  Filled 2023-06-21: qty 2

## 2023-06-21 MED ORDER — DIPHENHYDRAMINE HCL 25 MG PO CAPS
25.0000 mg | ORAL_CAPSULE | Freq: Once | ORAL | Status: AC
  Administered 2023-06-21: 25 mg via ORAL
  Filled 2023-06-21: qty 1

## 2023-06-21 NOTE — Progress Notes (Signed)
Diagnosis: Iron Deficiency Anemia  Provider:  Chilton Greathouse MD  Procedure: IV Push  IV Type: Peripheral, IV Location: L Antecubital  Venofer (Iron Sucrose), Dose: 200 mg  Post Infusion IV Care: Observation period completed and Peripheral IV Discontinued  Discharge: Condition: Good, Destination: Home . AVS Declined  Performed by:  Adriana Mccallum, RN

## 2023-06-26 NOTE — Progress Notes (Unsigned)
Office Visit Note  Patient: [name]             Date of Birth: Apr 13, 1951           MRN: [id number]             PCP: Melida Quitter, PA Referring: Carlean Jews, NP Visit Date: 07/10/2023 Occupation: @GUAROCC @  Subjective:  Discuss restarting plaquenil   History of Present Illness: [name] is a 72 y.o. female with history of seropositive rheumatoid arthritis.  Patient has been under the care of wound care for the past 43 weeks for a gluteal pressure ulcer.  She stopped using a wound VAC on 06/13/2023.  She was evaluated by wound care yesterday and will now be only having to follow-up on a monthly basis.  Patient has been holding Arava as advised by Dr. Corliss Skains.  She states that she also decided to hold Plaquenil.  Patient states that she has had increased arthralgias in both shoulders, both wrists, both hands, and both knee joints since being off of therapy.  She has been having to take ibuprofen more frequently to manage her symptoms.  Patient would like to discuss resuming Plaquenil as prescribed.  Patient states that her primary care and wound care provider were in agreement that she can resume Plaquenil. Patient states that she has been under the care of hematology for chronic anemia.  She was started on folic acid and venofer infusions x5--last infusion is scheduled on Tuesday.  Her energy level has improved but she continues to have cold intolerance.   Activities of Daily Living:  Patient reports morning stiffness for 15-20 minutes.   Patient Reports nocturnal pain.  Difficulty dressing/grooming: Denies Difficulty climbing stairs: Reports Difficulty getting out of chair: Denies Difficulty using hands for taps, buttons, cutlery, and/or writing: Reports  Review of Systems  Constitutional:  Negative for fatigue.  HENT:  Negative for mouth sores and mouth dryness.   Eyes:  Positive for dryness. Negative for pain and visual disturbance.  Respiratory:  Negative for  shortness of breath.   Cardiovascular:  Positive for palpitations. Negative for chest pain.  Gastrointestinal:  Negative for blood in stool, constipation and diarrhea.  Endocrine: Negative for increased urination.  Genitourinary:  Positive for involuntary urination.  Musculoskeletal:  Positive for joint pain, joint pain, joint swelling, myalgias, morning stiffness, muscle tenderness and myalgias. Negative for muscle weakness.  Skin:  Positive for color change. Negative for rash, hair loss and sensitivity to sunlight.  Allergic/Immunologic: Negative for susceptible to infections.  Neurological:  Positive for numbness and headaches. Negative for dizziness.  Hematological:  Positive for swollen glands.  Psychiatric/Behavioral:  Negative for depressed mood and sleep disturbance. The patient is not nervous/anxious.     PMFS History:  Patient Active Problem List   Diagnosis Date Noted  . IDA (iron deficiency anemia) 06/05/2023  . Normocytic normochromic anemia 06/05/2023  . Normocytic anemia 06/01/2023  . Situational anxiety 10/15/2022  . Pressure sore of left ischial area, stage IV (HCC) 09/10/2022  . Pain in right leg 09/10/2022  . DNR (do not resuscitate) 06/21/2022  . Agatston coronary artery calcium score greater than 400 05/25/2022  . Laryngopharyngeal reflux (LPR) 10/10/2021  . Chronic maxillary sinusitis 10/10/2021  . Prediabetes 02/14/2021  . Muscle spasm of back 08/01/2020  . Microalbuminuria 08/01/2020  . Chronic kidney disease, stage 3b (HCC) 08/01/2020  . Post-menopausal 09/02/2019  . Recurrent urticaria 05/20/2018  . Vitamin D deficiency 11/20/2017  . Mixed  hyperlipidemia 11/20/2017  . Family history of coronary arteriosclerosis- strong fam h/o CAD and early CAD.  07/17/2017  . Raynaud's disease without gangrene 03/12/2017  . Eosinophilic esophagitis 10/12/2016  . 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 Autoimmune disease (HCC) 09/02/2016  . Abnormality of gait  05/09/2016  . Sjogren's syndrome (HCC) 02/29/2016  . GERD (gastroesophageal reflux disease) 01/19/2016  . Glucose intolerance (impaired glucose tolerance) 01/19/2016  . Chronic diastolic CHF (congestive heart failure) (HCC) 04/20/2015  . Peripheral polyneuropathy 02/02/2014  . Class 2 obesity due to excess calories with body mass index (BMI) of 36.0 to 36.9 in adult 11/17/2013  . Heart murmur   . OSA (obstructive sleep apnea)   . Rheumatoid arthritis (HCC)   . Hiatal hernia   . Fibromyalgia   . PVC's (premature ventricular contractions)   . Unilateral primary osteoarthritis, left knee 06/22/2011    Past Medical History:  Diagnosis Date  . Agatston coronary artery calcium score greater than 400 05/2022   coronary Ca score 856  . Back pain   . Blood transfusion    1981  . Chronic diastolic CHF (congestive heart failure) (HCC)    diastolic   . COVID   . DDD (degenerative disc disease), cervical   . DDD (degenerative disc disease), lumbar   . Fibromyalgia   . GERD (gastroesophageal reflux disease)   . Heart murmur    as a child  . Hiatal hernia    sjorgens syndrome  . High blood pressure   . High cholesterol   . IBS (irritable bowel syndrome)   . OSA (obstructive sleep apnea)   . Osteoarthritis   . Peripheral autonomic neuropathy of unknown cause   . Pre-diabetes   . PVC (premature ventricular contraction)   . Raynaud disease   . Rheumatoid arthritis (HCC)   . Sjogren's disease (HCC)   . Urticaria   . Vitamin D deficiency     Family History  Problem Relation Age of Onset  . Alzheimer's disease Mother   . Heart attack Mother   . Hypertension Mother   . Glaucoma Mother   . High Cholesterol Mother   . Heart disease Mother   . Depression Mother   . Cancer Father   . High Cholesterol Father   . Heart disease Father   . Sleep apnea Father   . Breast cancer Maternal Aunt   . Heart attack Brother   . Heart disease Brother   . Glaucoma Brother   . Hyperlipidemia  Brother   . Glaucoma Brother   . Asthma Son   . Allergic rhinitis Neg Hx   . Angioedema Neg Hx   . Eczema Neg Hx   . Urticaria Neg Hx   . Neuropathy Neg Hx    Past Surgical History:  Procedure Laterality Date  . ABDOMINAL HYSTERECTOMY     BTL, BSO  . ADENOIDECTOMY    . BREAST EXCISIONAL BIOPSY Left   . BREAST SURGERY     mass removal   . CHOLECYSTECTOMY    . dental implants    . DIAGNOSTIC LAPAROSCOPY     x3  . FEMUR FRACTURE SURGERY Right   . KNEE ARTHROSCOPY     x2  . MASS EXCISION Left 03/22/2017   Procedure: EXCISION OF LEFT BREAST MASS;  Surgeon: Manus Rudd, MD;  Location: [facility];  Service: General;  Laterality: Left;  [name] MULTIPLE TOOTH EXTRACTIONS  06/2023   x5  . ORIF FEMUR FRACTURE Right 06/22/2022   Procedure: RIGHT  OPEN REDUCTION INTERNAL FIXATION (ORIF) DISTAL FEMUR FRACTURE;  Surgeon: Myrene Galas, MD;  Location: [facility];  Service: Orthopedics;  Laterality: Right;  . patotid cystectomy    . RIGHT/LEFT HEART CATH AND CORONARY ANGIOGRAPHY N/A 07/25/2019   Procedure: RIGHT/LEFT HEART CATH AND CORONARY ANGIOGRAPHY;  Surgeon: Dolores Patty, MD;  Location: [facility];  Service: Cardiovascular;  Laterality: N/A;  . TENDON REPAIR  1980   left ankle and tibia  . TONSILLECTOMY    . TOTAL KNEE ARTHROPLASTY  06/21/2011   Procedure: TOTAL KNEE ARTHROPLASTY;  Surgeon: Loanne Drilling;  Location: [facility];  Service: Orthopedics;  Laterality: Right;  . TUBAL LIGATION     Social History   Social History Narrative   Lives at home alone   Right-handed   Drinks 1 or less cups of coffee and 2 servings of either tea or soda per day   Immunization History  Administered Date(s) Administered  . Fluad Quad(high Dose 65+) 05/05/2021  . Influenza, High Dose Seasonal PF 06/12/2018, 05/17/2019  . Influenza,inj,Quad PF,6+ Mos 05/23/2016, 06/01/2017  . Influenza-Unspecified 06/05/2018, 05/17/2019, 06/07/2020, 05/18/2022, 07/06/2023  . PFIZER(Purple Top)SARS-COV-2  Vaccination 09/03/2019, 09/24/2019, 04/01/2020, 10/04/2020  . [employer], [employer] 38yrs & up 05/05/2021, 05/18/2022  . Pneumococcal Conjugate-13 07/02/2017  . Pneumococcal Polysaccharide-23 05/17/2005  . Respiratory Syncytial Virus Vaccine,Recomb Aduvanted(Arexvy) 05/18/2022  . Td 10/16/2007  . Tdap 03/21/2018  . Unspecified SARS-COV-2 Vaccination 07/06/2023  . Zoster Recombinant(Shingrix) 06/10/2019, 12/11/2019     Objective: Vital Signs: BP 120/65 (BP Location: Left Arm, Patient Position: Sitting, Cuff Size: Normal)   Pulse 64   Resp 16   Ht 5' 2.5" (1.588 m)   Wt 215 lb 12.8 oz (97.9 kg)   BMI 38.84 kg/m    Physical Exam Vitals and nursing note reviewed.  Constitutional:      Appearance: She is well-developed.  HENT:     Head: Normocephalic and atraumatic.  Eyes:     Conjunctiva/sclera: Conjunctivae normal.  Cardiovascular:     Rate and Rhythm: Normal rate and regular rhythm.     Heart sounds: Normal heart sounds.  Pulmonary:     Effort: Pulmonary effort is normal.     Breath sounds: Normal breath sounds.  Abdominal:     General: Bowel sounds are normal.     Palpations: Abdomen is soft.  Musculoskeletal:     Cervical back: Normal range of motion.  Lymphadenopathy:     Cervical: No cervical adenopathy.  Skin:    General: Skin is warm and dry.     Capillary Refill: Capillary refill takes less than 2 seconds.  Neurological:     Mental Status: She is alert and oriented to person, place, and time.  Psychiatric:        Behavior: Behavior normal.     Musculoskeletal Exam: Patient remained seated during the examination today.  C-spine has limited ROM with lateral rotation. Thoracolumbar scoliosis.  Shoulder joints and elbow joints have good ROM.  Synovial thickening of both wrists with tenderness upon palpation.  Synovial thickening of MCP joints.  Tenderness over both CMC joints and 1st MCP joints.  Tenderness of the right 2nd PIP.  Right knee  replacement has slightly limited extension with warmth.  Left knee has slightly limited ROM but no warmth or effusion noted.   CDAI Exam: CDAI Score: -- Patient Global: --; Provider Global: -- Swollen: --; Tender: -- Joint Exam 07/10/2023   No joint exam has been documented for this visit  There is currently no information documented on the homunculus. Go to the Rheumatology activity and complete the homunculus joint exam.  Investigation: No additional findings.  Imaging: No results found.  Recent Labs: Lab Results  Component Value Date   WBC 6.8 06/01/2023   HGB 9.5 (L) 06/01/2023   PLT 289 06/01/2023   NA 139 06/01/2023   K 4.3 06/01/2023   CL 110 06/01/2023   CO2 22 06/01/2023   GLUCOSE 106 (H) 06/01/2023   BUN 40 (H) 06/01/2023   CREATININE 1.26 (H) 06/01/2023   BILITOT 0.2 (L) 06/01/2023   ALKPHOS 81 06/01/2023   AST 11 (L) 06/01/2023   ALT 6 06/01/2023   PROT 7.1 06/01/2023   ALBUMIN 3.7 06/01/2023   CALCIUM 9.2 06/01/2023   GFRAA 58 (L) 09/23/2020    Speciality Comments: PLQ Eye Exam: 03/05/2023 [facility] f/u in 1 year   Procedures:  No procedures performed Allergies: Sulfa antibiotics, Cymbalta [duloxetine hcl], Demerol, Ivp dye [iodinated contrast media], Morphine and codeine, Sulfasalazine, and Adhesive [tape]    Assessment / Plan:     Visit Diagnoses: Rheumatoid arthritis involving multiple sites with positive rheumatoid factor (HCC) - Positive RF, positive ANA: Patient presents today with increased arthralgias affecting both shoulders, both wrists, both hands, and both knees.  She has been taking ibuprofen as needed for pain relief.  She has been holding both Arava and Plaquenil while under the care of of the wound clinic while healing from a pressure ulcer in the right gluteal region.  Discontinued use of the wound vac on 06/13/23-she will now be spacing visits to once monthly.   She is been experiencing significantly increased joint  pain and stiffness while off of Plaquenil and Arava.  Discussed that she is not a good candidate for Arava since the ulcer has not completely healed.  She has requested to resume Plaquenil since her PCP and wound clinic have felt comfortable with her resuming therapy.  Plan to resume Plaquenil at a reduced dose of 1 tablet daily.  She will require close lab monitoring.  She was advised to notify us if her symptoms persist or worsen.  She will follow-up in the office in 4 months or sooner if needed.  High risk medication use -  Plaquenil 200 mg 1 tablet by mouth by mouth daily-restarting plaquenil at a reduced dose with close lab monitoring.  Holding Arava due to pressure ulcer-under care of wound care-she will require clearance from wound care in the future if restarting arava is medically necessary.  CBC and CMP updated on 06/01/23.  Under care of hematology for chronic anemia.   PLQ Eye Exam: 03/05/2023 [facility] f/u in 1 year   Sicca syndrome (HCC) - ANA-, Ro-, and La-: Chronic eye dryness.  She uses restasis twice daily as needed.   Raynaud's disease without gangrene: Not currently symptomatic.   Chronic right shoulder pain: Intermittent discomfort.   History of total knee replacement, right: Limited extension. Warmth but no effusion noted.   Unilateral primary osteoarthritis, left knee: Chronic pain.  Using rollator walker. No warmth or effusion noted.  Degeneration of intervertebral disc of lumbar region without discogenic back pain or lower extremity pain: Chronic pain.  Using a rollator walker to assist with ambulation.  Pressure injury of right buttock, stage 4 (HCC) - diagnosed with a stage IV right gluteal ulcer in January 2024.  Patient has been going to wound care for the past 43 weeks.  She is no longer using  a wound VAC--discontinued on 06/13/2023.  She was evaluated at wound care yesterday and will now be spacing her visits to once monthly since her healing has  been improving.  She has continued to hold arava as advised.  Pressure injury of deep tissue of right heel-Lymphedema-wearing compression.  Fibromyalgia: Generalized hyperalgesia and positive tender points on exam.  Chronic SI joint pain: Chronic pain.    Other medical conditions are listed as follows:   Age-related osteoporosis without current pathological fracture - DEXA 08/27/2019: The BMD measured at Femur Total Right is 0.782 g/cm2 with a T-score of -1.8. s/p Forteo.  Status post fracture of femur - Right femur fracture in November 2023 after a fall  History of hyperlipidemia  History of cardiac murmur  History of diabetes mellitus  History of peripheral neuropathy  CKD (chronic kidney disease) stage 4, GFR 15-29 ml/min (HCC)  Eosinophilic esophagitis  History of diverticulitis  History of depression  Orders: No orders of the defined types were placed in this encounter.  Meds ordered this encounter  Medications  . hydroxychloroquine (PLAQUENIL) 200 MG tablet    Sig: Take 1 tablet (200 mg total) by mouth daily.    Dispense:  90 tablet    Refill:  0     Follow-Up Instructions: Return in about 4 months (around 11/07/2023) for Rheumatoid arthritis.   Gearldine Bienenstock, PA-C  Note - This record has been created using Dragon software.  Chart creation errors have been sought, but may not always  have been located. Such creation errors do not reflect on  the standard of medical care.

## 2023-06-27 ENCOUNTER — Encounter (HOSPITAL_BASED_OUTPATIENT_CLINIC_OR_DEPARTMENT_OTHER): Payer: PPO | Attending: General Surgery | Admitting: General Surgery

## 2023-06-27 DIAGNOSIS — I7300 Raynaud's syndrome without gangrene: Secondary | ICD-10-CM | POA: Diagnosis not present

## 2023-06-27 DIAGNOSIS — I5032 Chronic diastolic (congestive) heart failure: Secondary | ICD-10-CM | POA: Insufficient documentation

## 2023-06-27 DIAGNOSIS — L299 Pruritus, unspecified: Secondary | ICD-10-CM | POA: Insufficient documentation

## 2023-06-27 DIAGNOSIS — G629 Polyneuropathy, unspecified: Secondary | ICD-10-CM | POA: Insufficient documentation

## 2023-06-27 DIAGNOSIS — L89314 Pressure ulcer of right buttock, stage 4: Secondary | ICD-10-CM | POA: Diagnosis not present

## 2023-06-27 DIAGNOSIS — N1832 Chronic kidney disease, stage 3b: Secondary | ICD-10-CM | POA: Diagnosis not present

## 2023-06-27 DIAGNOSIS — M069 Rheumatoid arthritis, unspecified: Secondary | ICD-10-CM | POA: Diagnosis not present

## 2023-06-27 NOTE — Progress Notes (Addendum)
LESETTE, BUSEY ([id number]) [id number] Page 1 of 10 Visit Report for 06/27/2023 Chief Complaint Document Details Patient Name: Date of Service: [name], [name] 06/27/2023 12:30 PM Medical Record Number: [id number] Patient Account Number: [id number] Date of Birth/Sex: Treating RN: September 12, 1950 (72 y.o. F) Primary Care Provider: Saralyn Pilar Other Clinician: Referring Provider: Treating Provider/Extender: Park Liter in Treatment: 50 Information Obtained from: Patient Chief Complaint Patient is at the clinic for treatment of open pressure ulcers Electronic Signature(s) Signed: 06/27/2023 1:04:20 PM By: Duanne Guess MD FACS Entered By: Duanne Guess on 06/27/2023 10:04:20 -------------------------------------------------------------------------------- HPI Details Patient Name: Date of Service: [name] 06/27/2023 12:30 PM Medical Record Number: [id number] Patient Account Number: [id number] Date of Birth/Sex: Treating RN: [birth year] (72 y.o. F) Primary Care Provider: Saralyn Pilar Other Clinician: Referring Provider: Treating Provider/Extender: Park Liter in Treatment: 19 History of Present Illness HPI Description: ADMISSION 09/07/2023 This is a 72 year old woman with a past medical history notable for obesity, congestive heart failure, Raynaud's syndrome, CKD stage IIIb, osteoporosis, and rheumatoid arthritis. In November 2023, she suffered a fall that resulted in a femur fracture. She was hospitalized for about a week and underwent surgical repair of the fracture. She subsequently developed pressure ulcers on her right buttocks and ischium. She has been receiving home health services and they have been applying Medihoney. They have been reporting the wounds as stage II, but on evaluation, the large ulcer was probably unstageable at the time they were evaluating it but it is clearly a  stage IV at this point. The smaller ulcer does have fat layer exposure and therefore is a stage III. The patient is accompanied by her daughter. She says she has been sleeping on a regular bed but recently ordered an air mattress T opper, but does not have it yet. She is on Ozempic for weight loss and therefore has a poor appetite and struggles to get adequate protein intake. 09/15/2022: The large stage IV ulcer is substantially cleaner this week. The stage III ulcer is a little smaller. Both have slough accumulation. The culture that I took last week was polymicrobial. The Augmentin that I prescribed was adequate coverage for the species and she continues to take this. We have also ordered Keystone topical antibiotic compound, but this has not yet arrived. 09/21/2022: The stage IV ulcer has some necrotic muscle at the base but is otherwise fairly clean. The stage III ulcer is smaller again with light slough on the surface. She has her Keystone topical antibiotic compound with her today. 09/29/2022: There is still some necrotic muscle at the base of the stage IV ulcer that I was unable to get to last week. The stage III ulcer has healed. She unfortunately has developed a new pressure ulcer on her right heel. 10/06/2022: Still with some devitalized muscle at the base of the stage IV ulcer, but otherwise this wound is looking quite clean. The pressure induced tissue injury on her heel is demarcating and much of the area appears to be epithelialized, but there is still 2 areas that remain questionable. 10/12/2022: The stage IV ulcer is very clean and ready for wound VAC, which will be delivered tomorrow according to the patient. The tissue injury on her heel is drying up and does not feel particularly boggy this week. 10/19/2022: The heel injury continues to improve. There is still some dry eschar present on the more plantar aspect of it. There is some slough accumulation on the surface of  the stage IV pressure  ulcer. The wound VAC was initiated by home health last week. 11/01/2022: The gluteal pressure ulcer is very clean without any necrotic tissue or slough. The depth has come in by over half a centimeter. The deep tissue injury on her heel continues to improve. There is still dry eschar that we are painting with Betadine as well as some fresh-looking viable tissue at the margin. LILEY, POPPELL ([id number]) [id number] Page 2 of 10 11/15/2022: The gluteal pressure ulcer is clean and contracting. The eschar on her heel ulcer is beginning to separate from the underlying tissue. 11/29/2022: The gluteal pressure ulcer is very clean. The depth has come in by about a centimeter. The eschar has completely separated off of her heel. There is some fibrinous exudate and slough on the heel. It has declared itself as a stage III at this point. 12/13/2022: The heel ulcer is smaller and much cleaner, but still has some non-viable tissue present. The orifice of the gluteal ulcer is contracting, but the depth remains the same. Fortunately, the sponge for the [facility] is being packed appropriately into the full depth of the wound. 12/27/2022: The heel ulcer is smaller, but I think the depth and degree of tissue injury has finally declared itself, with tendon being exposed at the posterior aspect of the ulcer. It is now stage IV. The gluteal ulcer continues to contract circumferentially, but the depth is still unchanged. 01/10/2023: The heel ulcer has contracted further. There is still rubbery slough on the surface. There has been no further tissue breakdown; I think the last of the nonviable tissue was removed at her visit 2 weeks ago. The gluteal ulcer has contracted further circumferentially, but there is still a tunnel that ankles off cranially that remains about the same depth. The tissues appear viable at both sites. 01/24/2023: Unfortunately, the home health nurse that was applying the wound VAC did not  put any drape on her skin for the bridge and was applying sponge directly to the patient's skin. This is resulted in significant tissue breakdown and pain for the patient. Once we were notified of this, we discontinued the wound VAC and they have been packing the wound with saline moistened gauze. The heel has also deteriorated. Bone is now exposed. 01/30/2023: Her skin looks significantly better this week. It has recovered from the insult caused by applying the sponge directly to it. Her heel looks a little bit better as well. The culture that I took last week grew out group A strep, Proteus mirabilis, and Enterococcus, along with other skin flora. Although Levaquin was the recommended antibiotic, she is on amiodarone and therefore Levaquin is contraindicated. She has been taking Augmentin and is showing signs of improvement. 02/07/2023: The insult to her skin caused by direct application of wound VAC sponge has nearly completely healed. The gluteal ulcer cavity continues to contract circumferentially, but still probes quite deeply; I suspect the sponge for her VAC is not getting placed completely down into that tunnel. The heel is looking better today. There is more granulation filling in. I did not feel bone today. 02/14/2023: The gluteal ulcer cavity has circumferentially contracted even further, although the depth remains the same. The heel has improved and there is good tissue overlying where the bone had been exposed. Both wounds have some slough on the surface. 02/21/2023: The gluteal ulcer cavity continues to contract and is about half a centimeter shallower this week. The surface is clean without any slough or other debris  accumulation. The heel continues to fill in granulation tissue over where the bone had been exposed. There is some slough on the wound surface. 03/07/2023: The gluteal ulcer was measured slightly deeper today, but on palpation, it feels roughly the same. The cavity is certainly  more contracted. She was complaining of more pain in the area and it appears that GranuFoam was applied directly to her skin and she has some irritation from this. The heel ulcer is filling in with excellent granulation tissue. It does not probe to bone any longer. Minimal slough accumulation. 03/21/2023: The gluteal ulcer is shallower and the cavity is tighter. She has some periwound skin breakdown that we have been trying to address by applying DuoDERM to the skin around the wound opening. The home health nurse also added nystatin powder to address some yeasty-looking areas. The heel ulcer is much shallower and smaller. Minimal slough and biofilm buildup. 04/04/2023: The gluteal ulcer continues to get shallower and narrower. The periwound skin looks much better this week. The heel ulcer has a band of epithelialized tissue separating the site into 2 openings. There is very little depth remaining and the wounds are quite clean with minimal slough on the surface. 04/18/2023: The heel ulcer is nearly completely closed. There is just 1 open very superficial area remaining underneath some eschar and slough. The gluteal ulcer still has the same depth at its deepest point, but everything is much narrower and constricted. The wound is clean without any slough. 05/02/2023: The heel ulcer is closed. She has had a recurrence of the rash on her gluteus around the wound. Her home health nurse left the wound VAC off after her visit on Monday because of this. The gluteal ulcer continues to contract and has a very clean surface. 05/16/2023: The gluteal fungal rash has worsened. The patient is complaining of significant itching and discomfort. Her home health nurse has been applying silver alginate to the periwound skin to try and protect it, but she really does not have a whole lot of drainage due to the excellent function of the wound VAC. The wound depth has come in a bit more and the orifice is narrowing  further. 05/30/2023: She has been taking fluconazole and the fungal rash on her buttocks is improving. She still has skin irritation that may be related to the drape adhesive. The wound itself is smaller and shallower with good granulation tissue on the surface. There are no longer any nooks and crannies in the tissue. 06/13/2023: The depth of the wound has come in by 50%. She has had a fair amount of drainage, likely attributable to the hypertrophic granulation tissue that has accumulated. 06/27/2023: The wound has contracted considerably. The hypertrophic granulation tissue has not reaccumulated. Drainage has decreased markedly. Her skin irritation has completely resolved. Electronic Signature(s) Signed: 06/27/2023 1:04:58 PM By: Duanne Guess MD FACS Entered By: Duanne Guess on 06/27/2023 10:04:58 -------------------------------------------------------------------------------- Physical Exam Details Patient Name: Date of Service: [name] 06/27/2023 12:30 PM Medical Record Number: [id number] Patient Account Number: [id number] Date of Birth/Sex: Treating RN: [birth year] (72 y.o. F) Primary Care Provider: Saralyn Pilar Other Clinician: Referring Provider: Treating Provider/Extender: Park Liter in Treatment: [address], [place] ([id number]) [id number] Page 3 of 10 Constitutional . . . . no acute distress. Respiratory Normal work of breathing on room air.. Notes 06/27/2023: The wound has contracted considerably. The hypertrophic granulation tissue has not reaccumulated. Drainage has decreased markedly. Her skin irritation has completely resolved. Electronic Signature(s)  Signed: 06/27/2023 1:07:23 PM By: Duanne Guess MD FACS Entered By: Duanne Guess on 06/27/2023 10:07:23 -------------------------------------------------------------------------------- Physician Orders Details Patient Name: Date of  Service: [name] 06/27/2023 12:30 PM Medical Record Number: [id number] Patient Account Number: [id number] Date of Birth/Sex: Treating RN: [birth year] (72 y.o. [name] Primary Care Provider: Saralyn Pilar Other Clinician: Referring Provider: Treating Provider/Extender: Park Liter in Treatment: 13 The following information was scribed by: Zenaida Deed The information was scribed for: Duanne Guess Verbal / Phone Orders: No Diagnosis Coding ICD-10 Coding Code Description L89.314 Pressure ulcer of right buttock, stage 4 I73.00 Raynaud's syndrome without gangrene I50.32 Chronic diastolic (congestive) heart failure N18.32 Chronic kidney disease, stage 3b R73.02 Impaired glucose tolerance (oral) E66.9 Obesity, unspecified Follow-up Appointments ppointment in 2 weeks. - Dr Lady Gary Rm 1 Return A Mon 11/25 @ 12:30 pm Anesthetic Wound #1 Right Gluteus (In clinic) Topical Lidocaine 4% applied to wound bed Bathing/ Shower/ Hygiene May shower and wash wound with soap and water. Negative Presssure Wound Therapy Wound #1 Right Gluteus Discontinue wound vac. Call the number on the machine for the company to pick up. Off-Loading Heel suspension boot to: - globoped right foot to ambulate Low air-loss mattress (Group 2) - Adapt Turn and reposition every 2 hours - avoid lying on back, stand at least every hour while out of bed, float heels off bed with pillows under calves while in bed Prevalon Boot - right foot esp. while in bed. Additional Orders / Instructions Follow Nutritious Diet - increase protein to 70-80 gms per day, hold ozempic Juven Shake 1-2 times daily. Home Health No change in wound care orders this week; continue Home Health for wound care. May utilize formulary equivalent dressing for wound QUINCEY, ROBBINSON ([id number]) [phone].pdf Page 4 of 10 treatment orders unless otherwise specified. Other  Home Health Orders/Instructions: - [facility] Wound Treatment Wound #1 - Gluteus Wound Laterality: Right Cleanser: Wound Cleanser 3 x Per Week/30 Days Discharge Instructions: Cleanse the wound with wound cleanser prior to applying a clean dressing using gauze sponges, not tissue or cotton balls. Peri-Wound Care: Nystop Powder (Nystatin) 3 x Per Week/30 Days Discharge Instructions: Apply Nystop (Nystatin) Powder as needed to irritated skin Peri-Wound Care: Zinc Oxide Ointment 30g tube 3 x Per Week/30 Days Discharge Instructions: Apply Zinc Oxide to periwound with each dressing change Peri-Wound Care: hydrocortisone cream 1% 3 x Per Week/30 Days Discharge Instructions: may apply to rash for itching as needed Prim Dressing: Vashe 3 x Per Week/30 Days ary Discharge Instructions: moisten gauze with Vashe and lightly pack into wound Secondary Dressing: Zetuvit Plus Silicone Border Dressing 5x5 (in/in) 3 x Per Week/30 Days Discharge Instructions: Apply silicone border over primary dressing as directed. Electronic Signature(s) Signed: 06/27/2023 4:45:05 PM By: Duanne Guess MD FACS Entered By: Duanne Guess on 06/27/2023 10:07:37 -------------------------------------------------------------------------------- Problem List Details Patient Name: Date of Service: [name] 06/27/2023 12:30 PM Medical Record Number: [id number] Patient Account Number: [id number] Date of Birth/Sex: Treating RN: 08-Jul-1951 (72 y.o. [name] Primary Care Provider: Saralyn Pilar Other Clinician: Referring Provider: Treating Provider/Extender: [name] Weeks in Treatment: 77 Active Problems ICD-10 Encounter Code Description Active Date MDM Diagnosis L89.314 Pressure ulcer of right buttock, stage 4 09/07/2022 No Yes I73.00 Raynaud's syndrome without gangrene 09/07/2022 No Yes I50.32 Chronic diastolic (congestive) heart failure 09/07/2022 No Yes N18.32 Chronic kidney  disease, stage 3b 09/07/2022 No Yes R73.02 Impaired glucose tolerance (oral) 09/07/2022 No Yes E66.9 Obesity, unspecified 09/07/2022  No Yes HARMANI, KOWALL ([id number]) [phone].pdf Page 5 of 10 Inactive Problems ICD-10 Code Description Active Date Inactive Date L89.313 Pressure ulcer of right buttock, stage 3 09/07/2022 09/07/2022 Resolved Problems ICD-10 Code Description Active Date Resolved Date L89.614 Pressure ulcer of right heel, stage 4 09/28/2022 09/28/2022 Electronic Signature(s) Signed: 06/27/2023 1:04:09 PM By: Duanne Guess MD FACS Entered By: Duanne Guess on 06/27/2023 10:04:09 -------------------------------------------------------------------------------- Progress Note Details Patient Name: Date of Service: [name] 06/27/2023 12:30 PM Medical Record Number: [id number] Patient Account Number: [id number] Date of Birth/Sex: Treating RN: 1951/04/21 (72 y.o. F) Primary Care Provider: Saralyn Pilar Other Clinician: Referring Provider: Treating Provider/Extender: Park Liter in Treatment: 29 Subjective Chief Complaint Information obtained from Patient Patient is at the clinic for treatment of open pressure ulcers History of Present Illness (HPI) ADMISSION 09/07/2023 This is a 72 year old woman with a past medical history notable for obesity, congestive heart failure, Raynaud's syndrome, CKD stage IIIb, osteoporosis, and rheumatoid arthritis. In November 2023, she suffered a fall that resulted in a femur fracture. She was hospitalized for about a week and underwent surgical repair of the fracture. She subsequently developed pressure ulcers on her right buttocks and ischium. She has been receiving home health services and they have been applying Medihoney. They have been reporting the wounds as stage II, but on evaluation, the large ulcer was probably unstageable at the time they were evaluating it but it is  clearly a stage IV at this point. The smaller ulcer does have fat layer exposure and therefore is a stage III. The patient is accompanied by her daughter. She says she has been sleeping on a regular bed but recently ordered an air mattress T opper, but does not have it yet. She is on Ozempic for weight loss and therefore has a poor appetite and struggles to get adequate protein intake. 09/15/2022: The large stage IV ulcer is substantially cleaner this week. The stage III ulcer is a little smaller. Both have slough accumulation. The culture that I took last week was polymicrobial. The Augmentin that I prescribed was adequate coverage for the species and she continues to take this. We have also ordered Keystone topical antibiotic compound, but this has not yet arrived. 09/21/2022: The stage IV ulcer has some necrotic muscle at the base but is otherwise fairly clean. The stage III ulcer is smaller again with light slough on the surface. She has her Keystone topical antibiotic compound with her today. 09/29/2022: There is still some necrotic muscle at the base of the stage IV ulcer that I was unable to get to last week. The stage III ulcer has healed. She unfortunately has developed a new pressure ulcer on her right heel. 10/06/2022: Still with some devitalized muscle at the base of the stage IV ulcer, but otherwise this wound is looking quite clean. The pressure induced tissue injury on her heel is demarcating and much of the area appears to be epithelialized, but there is still 2 areas that remain questionable. 10/12/2022: The stage IV ulcer is very clean and ready for wound VAC, which will be delivered tomorrow according to the patient. The tissue injury on her heel is drying up and does not feel particularly boggy this week. 10/19/2022: The heel injury continues to improve. There is still some dry eschar present on the more plantar aspect of it. There is some slough accumulation on the surface of the stage IV  pressure ulcer. The wound VAC was initiated by home health last  week. 11/01/2022: The gluteal pressure ulcer is very clean without any necrotic tissue or slough. The depth has come in by over half a centimeter. The deep tissue injury on her heel continues to improve. There is still dry eschar that we are painting with Betadine as well as some fresh-looking viable tissue at the margin. 11/15/2022: The gluteal pressure ulcer is clean and contracting. The eschar on her heel ulcer is beginning to separate from the underlying tissue. 11/29/2022: The gluteal pressure ulcer is very clean. The depth has come in by about a centimeter. The eschar has completely separated off of her heel. There is some fibrinous exudate and slough on the heel. It has declared itself as a stage III at this point. ISREAL, RUNQUIST ([id number]) [id number].pdf Page 6 of 10 12/13/2022: The heel ulcer is smaller and much cleaner, but still has some non-viable tissue present. The orifice of the gluteal ulcer is contracting, but the depth remains the same. Fortunately, the sponge for the [facility] is being packed appropriately into the full depth of the wound. 12/27/2022: The heel ulcer is smaller, but I think the depth and degree of tissue injury has finally declared itself, with tendon being exposed at the posterior aspect of the ulcer. It is now stage IV. The gluteal ulcer continues to contract circumferentially, but the depth is still unchanged. 01/10/2023: The heel ulcer has contracted further. There is still rubbery slough on the surface. There has been no further tissue breakdown; I think the last of the nonviable tissue was removed at her visit 2 weeks ago. The gluteal ulcer has contracted further circumferentially, but there is still a tunnel that ankles off cranially that remains about the same depth. The tissues appear viable at both sites. 01/24/2023: Unfortunately, the home health nurse that was applying the wound VAC  did not put any drape on her skin for the bridge and was applying sponge directly to the patient's skin. This is resulted in significant tissue breakdown and pain for the patient. Once we were notified of this, we discontinued the wound VAC and they have been packing the wound with saline moistened gauze. The heel has also deteriorated. Bone is now exposed. 01/30/2023: Her skin looks significantly better this week. It has recovered from the insult caused by applying the sponge directly to it. Her heel looks a little bit better as well. The culture that I took last week grew out group A strep, Proteus mirabilis, and Enterococcus, along with other skin flora. Although Levaquin was the recommended antibiotic, she is on amiodarone and therefore Levaquin is contraindicated. She has been taking Augmentin and is showing signs of improvement. 02/07/2023: The insult to her skin caused by direct application of wound VAC sponge has nearly completely healed. The gluteal ulcer cavity continues to contract circumferentially, but still probes quite deeply; I suspect the sponge for her VAC is not getting placed completely down into that tunnel. The heel is looking better today. There is more granulation filling in. I did not feel bone today. 02/14/2023: The gluteal ulcer cavity has circumferentially contracted even further, although the depth remains the same. The heel has improved and there is good tissue overlying where the bone had been exposed. Both wounds have some slough on the surface. 02/21/2023: The gluteal ulcer cavity continues to contract and is about half a centimeter shallower this week. The surface is clean without any slough or other debris accumulation. The heel continues to fill in granulation tissue over where the bone had been  exposed. There is some slough on the wound surface. 03/07/2023: The gluteal ulcer was measured slightly deeper today, but on palpation, it feels roughly the same. The cavity is  certainly more contracted. She was complaining of more pain in the area and it appears that GranuFoam was applied directly to her skin and she has some irritation from this. The heel ulcer is filling in with excellent granulation tissue. It does not probe to bone any longer. Minimal slough accumulation. 03/21/2023: The gluteal ulcer is shallower and the cavity is tighter. She has some periwound skin breakdown that we have been trying to address by applying DuoDERM to the skin around the wound opening. The home health nurse also added nystatin powder to address some yeasty-looking areas. The heel ulcer is much shallower and smaller. Minimal slough and biofilm buildup. 04/04/2023: The gluteal ulcer continues to get shallower and narrower. The periwound skin looks much better this week. The heel ulcer has a band of epithelialized tissue separating the site into 2 openings. There is very little depth remaining and the wounds are quite clean with minimal slough on the surface. 04/18/2023: The heel ulcer is nearly completely closed. There is just 1 open very superficial area remaining underneath some eschar and slough. The gluteal ulcer still has the same depth at its deepest point, but everything is much narrower and constricted. The wound is clean without any slough. 05/02/2023: The heel ulcer is closed. She has had a recurrence of the rash on her gluteus around the wound. Her home health nurse left the wound VAC off after her visit on Monday because of this. The gluteal ulcer continues to contract and has a very clean surface. 05/16/2023: The gluteal fungal rash has worsened. The patient is complaining of significant itching and discomfort. Her home health nurse has been applying silver alginate to the periwound skin to try and protect it, but she really does not have a whole lot of drainage due to the excellent function of the wound VAC. The wound depth has come in a bit more and the orifice is narrowing  further. 05/30/2023: She has been taking fluconazole and the fungal rash on her buttocks is improving. She still has skin irritation that may be related to the drape adhesive. The wound itself is smaller and shallower with good granulation tissue on the surface. There are no longer any nooks and crannies in the tissue. 06/13/2023: The depth of the wound has come in by 50%. She has had a fair amount of drainage, likely attributable to the hypertrophic granulation tissue that has accumulated. 06/27/2023: The wound has contracted considerably. The hypertrophic granulation tissue has not reaccumulated. Drainage has decreased markedly. Her skin irritation has completely resolved. Patient History Information obtained from Patient, Caregiver, Chart. Family History Cancer - Father,Paternal Grandparents, Heart Disease - Mother,Father,Paternal Grandparents, Hypertension - Mother, Stroke - Maternal Grandparents, No family history of Diabetes, Hereditary Spherocytosis, Kidney Disease, Lung Disease, Seizures, Thyroid Problems, Tuberculosis. 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 Medical History Eyes Denies history of Cataracts, Glaucoma, Optic Neuritis Ear/Nose/Mouth/Throat Patient has history of Chronic sinus problems/congestion - chronic rhinitis Denies history of Middle ear problems Respiratory Patient has history of Sleep Apnea - uses CPAP Cardiovascular Patient has history of Congestive Heart Failure Endocrine Denies history of Type I Diabetes, Type II Diabetes Genitourinary Denies history of End Stage Renal Disease Immunological Patient has history of Raynauds  Denies history of Lupus Erythematosus, Scleroderma Integumentary (Skin) Denies history of History of Burn Musculoskeletal Patient has history of Rheumatoid Arthritis CHARM, BROTHER ([id number])  [phone].pdf Page 7 of 10 Denies history of Gout, Osteoarthritis, Osteomyelitis Neurologic Patient has history of Neuropathy Denies history of Dementia, Quadriplegia, Paraplegia, Seizure Disorder Oncologic Denies history of Received Chemotherapy, Received Radiation Psychiatric Denies history of Anorexia/bulimia, Confinement Anxiety Hospitalization/Surgery History - right femur fx ORIR. - right knee replacement. - left breast mass excision. - left ankle tibia repair. - abdominal hysterectomy. - adnoidectomy/tonsillectomy. - cholecystectomy. - dental implants. - patoid cystectomy. - tubal ligation. Medical A Surgical History Notes nd Constitutional Symptoms (General Health) morbid obesity Cardiovascular hyperlipidemia Gastrointestinal GERD, IBS, eosinophilic esophagitis, diverticulosis, Genitourinary CKD stage 3 Immunological Sjoegren syndrome, fibromyalgia Objective Constitutional no acute distress. Vitals Time Taken: 12:39 PM, Height: 62 in, Weight: 207 lbs, BMI: 37.9, Temperature: 97.8 F, Pulse: 62 bpm, Respiratory Rate: 18 breaths/min, Blood Pressure: 134/63 mmHg. Respiratory Normal work of breathing on room air.. General Notes: 06/27/2023: The wound has contracted considerably. The hypertrophic granulation tissue has not reaccumulated. Drainage has decreased markedly. Her skin irritation has completely resolved. Integumentary (Hair, Skin) Wound #1 status is Open. Original cause of wound was Pressure Injury. The date acquired was: 08/11/2022. The wound has been in treatment 41 weeks. The wound is located on the Right Gluteus. The wound measures 1cm length x 2.4cm width x 2.2cm depth; 1.885cm^2 area and 4.147cm^3 volume. There is Fat Layer (Subcutaneous Tissue) exposed. There is no tunneling or undermining noted. There is a medium amount of serosanguineous drainage noted. The wound margin is well defined and not attached to the wound base. There is  large (67-100%) red granulation within the wound bed. There is no necrotic tissue within the wound bed. The periwound skin appearance had no abnormalities noted for moisture. The periwound skin appearance had no abnormalities noted for color. The periwound skin appearance did not exhibit: Callus, Crepitus, Excoriation, Induration, Rash, Scarring. Periwound temperature was noted as No Abnormality. The periwound has tenderness on palpation. Assessment Active Problems ICD-10 Pressure ulcer of right buttock, stage 4 Raynaud's syndrome without gangrene Chronic diastolic (congestive) heart failure Chronic kidney disease, stage 3b Impaired glucose tolerance (oral) Obesity, unspecified Plan Follow-up Appointments: Return Appointment in 2 weeks. - Dr Lady Gary Rm 1 Mon 11/25 @ 12:30 pm Anesthetic: Wound #1 Right Gluteus: (In clinic) Topical Lidocaine 4% applied to wound bed Bathing/ Shower/ Hygiene: May shower and wash wound with soap and water. DOSSIE, KONECNY ([id number]) [id number].pdf Page 8 of 10 Negative Presssure Wound Therapy: Wound #1 Right Gluteus: Discontinue wound vac. Call the number on the machine for the company to pick up. Off-Loading: Heel suspension boot to: - globoped right foot to ambulate Low air-loss mattress (Group 2) - Adapt Turn and reposition every 2 hours - avoid lying on back, stand at least every hour while out of bed, float heels off bed with pillows under calves while in bed Prevalon Boot - right foot esp. while in bed. Additional Orders / Instructions: Follow Nutritious Diet - increase protein to 70-80 gms per day, hold ozempic Juven Shake 1-2 times daily. Home Health: No change in wound care orders this week; continue Home Health for wound care. May utilize formulary equivalent dressing for wound treatment orders unless otherwise specified. Other Home Health Orders/Instructions: - [facility] WOUND #1: - Gluteus Wound Laterality:  Right Cleanser: Wound Cleanser 3 x Per Week/30 Days Discharge Instructions: Cleanse the wound with wound cleanser prior to applying a clean  dressing using gauze sponges, not tissue or cotton balls. Peri-Wound Care: Nystop Powder (Nystatin) 3 x Per Week/30 Days Discharge Instructions: Apply Nystop (Nystatin) Powder as needed to irritated skin Peri-Wound Care: Zinc Oxide Ointment 30g tube 3 x Per Week/30 Days Discharge Instructions: Apply Zinc Oxide to periwound with each dressing change Peri-Wound Care: hydrocortisone cream 1% 3 x Per Week/30 Days Discharge Instructions: may apply to rash for itching as needed Prim Dressing: Vashe 3 x Per Week/30 Days ary Discharge Instructions: moisten gauze with Vashe and lightly pack into wound Secondary Dressing: Zetuvit Plus Silicone Border Dressing 5x5 (in/in) 3 x Per Week/30 Days Discharge Instructions: Apply silicone border over primary dressing as directed. 06/27/2023: The wound has contracted considerably. The hypertrophic granulation tissue has not reaccumulated. Drainage has decreased markedly. Her skin irritation has completely resolved. No debridement was necessary today. We will continue to pack the wound with Vashe-moistened gauze. She will follow-up in 2 weeks. Electronic Signature(s) Signed: 06/27/2023 1:08:12 PM By: Duanne Guess MD FACS Entered By: Duanne Guess on 06/27/2023 10:08:11 -------------------------------------------------------------------------------- HxROS Details Patient Name: Date of Service: [name] 06/27/2023 12:30 PM Medical Record Number: [id number] Patient Account Number: [id number] Date of Birth/Sex: Treating RN: [birth year] (72 y.o. F) Primary Care Provider: Saralyn Pilar Other Clinician: Referring Provider: Treating Provider/Extender: Park Liter in Treatment: 72 Information Obtained From Patient Caregiver Chart Constitutional Symptoms (General Health) Medical  History: Past Medical History Notes: morbid obesity Eyes Medical History: Negative for: Cataracts; Glaucoma; Optic Neuritis Ear/Nose/Mouth/Throat Medical History: Positive for: Chronic sinus problems/congestion - chronic rhinitis Negative for: Middle ear problems Respiratory YARET, RIOPEL ([id number]) [id number].pdf Page 9 of 10 Medical History: Positive for: Sleep Apnea - uses CPAP Cardiovascular Medical History: Positive for: Congestive Heart Failure Past Medical History Notes: hyperlipidemia Gastrointestinal Medical History: Past Medical History Notes: GERD, IBS, eosinophilic esophagitis, diverticulosis, Endocrine Medical History: Negative for: Type I Diabetes; Type II Diabetes Genitourinary Medical History: Negative for: End Stage Renal Disease Past Medical History Notes: CKD stage 3 Immunological Medical History: Positive for: Raynauds Negative for: Lupus Erythematosus; Scleroderma Past Medical History Notes: Sjoegren syndrome, fibromyalgia Integumentary (Skin) Medical History: Negative for: History of Burn Musculoskeletal Medical History: Positive for: Rheumatoid Arthritis Negative for: Gout; Osteoarthritis; Osteomyelitis Neurologic Medical History: Positive for: Neuropathy Negative for: Dementia; Quadriplegia; Paraplegia; Seizure Disorder Oncologic Medical History: Negative for: Received Chemotherapy; Received Radiation Psychiatric Medical History: Negative for: Anorexia/bulimia; Confinement Anxiety HBO Extended History Items Ear/Nose/Mouth/Throat: Chronic sinus problems/congestion Immunizations Pneumococcal Vaccine: Received Pneumococcal Vaccination: Yes Received Pneumococcal Vaccination On or After 60th Birthday: Yes Implantable Devices None Hospitalization / Surgery History Type of Hospitalization/Surgery YOHANA, TASKER ([id number]) [id number].pdf Page 10 of 10 right femur fx ORIR right knee  replacement left breast mass excision left ankle tibia repair abdominal hysterectomy adnoidectomy/tonsillectomy cholecystectomy dental implants patoid cystectomy tubal ligation Family and Social History Cancer: Yes - Father,Paternal Grandparents; Diabetes: No; Heart Disease: Yes - Mother,Father,Paternal Grandparents; Hereditary Spherocytosis: No; Hypertension: Yes - Mother; Kidney Disease: No; Lung Disease: No; Seizures: No; Stroke: Yes - Maternal Grandparents; Thyroid Problems: No; Tuberculosis: No; Never smoker; Marital Status - Widowed; Alcohol Use: Rarely; Drug Use: Prior History - TCH; Caffeine Use: Daily - soda, tea; Financial Concerns: No; Food, Clothing or Shelter Needs: No; Support System Lacking: No; Transportation Concerns: No Electronic Signature(s) Signed: 06/27/2023 4:45:05 PM By: Duanne Guess MD FACS Entered By: Duanne Guess on 06/27/2023 10:05:06 -------------------------------------------------------------------------------- SuperBill Details Patient Name: Date of Service: [name] 06/27/2023 Medical Record Number: [id number] Patient Account Number:  [id number] Date of Birth/Sex: Treating RN: 06-05-1951 (72 y.o. F) Primary Care Provider: Saralyn Pilar Other Clinician: Referring Provider: Treating Provider/Extender: Park Liter in Treatment: 41 Diagnosis Coding ICD-10 Codes Code Description L89.314 Pressure ulcer of right buttock, stage 4 I73.00 Raynaud's syndrome without gangrene I50.32 Chronic diastolic (congestive) heart failure N18.32 Chronic kidney disease, stage 3b R73.02 Impaired glucose tolerance (oral) E66.9 Obesity, unspecified Facility Procedures : CPT4 Code: [id number] Description: 99213 - WOUND CARE VISIT-LEV 3 EST PT Modifier: Quantity: 1 Physician Procedures : CPT4 Code Description Modifier [id number] 99214 - WC PHYS LEVEL 4 - EST PT ICD-10 Diagnosis Description L89.314 Pressure ulcer of right buttock,  stage 4 E66.9 Obesity, unspecified I73.00 Raynaud's syndrome without gangrene I50.32 Chronic diastolic  (congestive) heart failure Quantity: 1 Electronic Signature(s) Signed: 07/09/2023 1:23:31 PM By: Pearletha Alfred Signed: 07/09/2023 4:39:09 PM By: Duanne Guess MD FACS Previous Signature: 06/27/2023 1:08:29 PM Version By: Duanne Guess MD FACS Entered By: Pearletha Alfred on 07/09/2023 10:23:31

## 2023-06-28 ENCOUNTER — Ambulatory Visit: Payer: PPO

## 2023-06-28 VITALS — BP 113/64 | HR 61 | Temp 98.2°F | Resp 16 | Ht 62.5 in | Wt 212.0 lb

## 2023-06-28 DIAGNOSIS — D649 Anemia, unspecified: Secondary | ICD-10-CM | POA: Diagnosis not present

## 2023-06-28 DIAGNOSIS — D500 Iron deficiency anemia secondary to blood loss (chronic): Secondary | ICD-10-CM

## 2023-06-28 MED ORDER — ACETAMINOPHEN 325 MG PO TABS
650.0000 mg | ORAL_TABLET | Freq: Once | ORAL | Status: AC
  Administered 2023-06-28: 650 mg via ORAL
  Filled 2023-06-28: qty 2

## 2023-06-28 MED ORDER — IRON SUCROSE 20 MG/ML IV SOLN
200.0000 mg | Freq: Once | INTRAVENOUS | Status: AC
  Administered 2023-06-28: 200 mg via INTRAVENOUS
  Filled 2023-06-28: qty 10

## 2023-06-28 MED ORDER — DIPHENHYDRAMINE HCL 25 MG PO CAPS
25.0000 mg | ORAL_CAPSULE | Freq: Once | ORAL | Status: AC
  Administered 2023-06-28: 25 mg via ORAL
  Filled 2023-06-28: qty 1

## 2023-06-28 NOTE — Progress Notes (Signed)
[name] ([id number]) [id number] Page 1 of 7 Visit Report for 06/27/2023 Arrival Information Details Patient Name: Date of Service: [name], [name] 06/27/2023 12:30 PM Medical Record Number: [id number] Patient Account Number: [id number] Date of Birth/Sex: Treating RN: 10-Apr-1951 (72 y.o. [name], [name] Primary Care [name]: [name] Other Clinician: Referring [name]: Treating [name]/Extender: [name] in Treatment: 71 Visit Information History Since Last Visit Added or deleted any medications: No Patient Arrived: [name] Any new allergies or adverse reactions: No Arrival Time: 12:36 Had a fall or experienced change in No Accompanied By: self activities of daily living that may affect Transfer Assistance: None risk of falls: Patient Identification Verified: Yes Signs or symptoms of abuse/neglect since last visito No Secondary Verification Process Completed: Yes Hospitalized since last visit: No Patient Requires Transmission-Based Precautions: No Implantable device outside of the clinic excluding No Patient Has Alerts: No cellular tissue based products placed in the center since last visit: Has Dressing in Place as Prescribed: Yes Pain Present Now: No Electronic Signature(s) Signed: 06/27/2023 5:10:59 PM By: Zenaida Deed RN, BSN Entered By: Zenaida Deed on 06/27/2023 12:39:15 -------------------------------------------------------------------------------- Encounter Discharge Information Details Patient Name: Date of Service: Marisa Roux E. 06/27/2023 12:30 PM Medical Record Number: [id number] Patient Account Number: [id number] Date of Birth/Sex: Treating RN: Dec 14, 1950 (72 y.o. [name] Primary Care Keny Donald: [name] Other Clinician: Referring Shanise Balch: Treating [name]/Extender: [name] in Treatment: 10 Encounter Discharge Information  Items Discharge Condition: Stable Ambulatory Status: Walker Discharge Destination: Home Transportation: Private Auto Accompanied By: self Schedule Follow-up Appointment: Yes Clinical Summary of Care: Patient Declined Electronic Signature(s) Signed: 06/27/2023 5:10:59 PM By: Zenaida Deed RN, BSN Entered By: Zenaida Deed on 06/27/2023 13:09:00 [name] ([id number]) [id number] Page 2 of 7 -------------------------------------------------------------------------------- Lower Extremity Assessment Details Patient Name: Date of Service: [name], [name] 06/27/2023 12:30 PM Medical Record Number: [id number] Patient Account Number: [id number] Date of Birth/Sex: Treating RN: 04-Nov-1950 (72 y.o. [name] Primary Care [name]: [name] Other Clinician: Referring Aamiyah Derrick: Treating Rosalee Tolley/Extender: [name] in Treatment: 72 Electronic Signature(s) Signed: 06/27/2023 5:10:59 PM By: Zenaida Deed RN, BSN Entered By: Zenaida Deed on 06/27/2023 12:41:53 -------------------------------------------------------------------------------- Multi Wound Chart Details Patient Name: Date of Service: [name] 06/27/2023 12:30 PM Medical Record Number: [id number] Patient Account Number: [id number] Date of Birth/Sex: Treating RN: January 28, 1951 (72 y.o. F) Primary Care Agapita Savarino: [name] Other Clinician: Referring Jaythen Hamme: Treating Virgle Arth/Extender: [name] in Treatment: 41 Vital Signs Height(in): 62 Pulse(bpm): 62 Weight(lbs): 207 Blood Pressure(mmHg): 134/63 Body Mass Index(BMI): 37.9 Temperature(F): 97.8 Respiratory Rate(breaths/min): 18 [1:Photos:] [N/A:N/A] Right Gluteus N/A N/A Wound Location: Pressure Injury N/A N/A Wounding Event: Pressure Ulcer N/A N/A Primary Etiology: Chronic sinus problems/congestion, N/A N/A Comorbid History: Sleep Apnea,  Congestive Heart Failure, Raynauds, Rheumatoid Arthritis, Neuropathy 08/11/2022 N/A N/A Date Acquired: 30 N/A N/A Cooper of Treatment: Open N/A N/A Wound Status: No N/A N/A Wound Recurrence: 1x2.4x2.2 N/A N/A Measurements L x W x D (cm) 1.885 N/A N/A A (cm) : rea 4.147 N/A N/A Volume (cm) : 91.40% N/A N/A % Reduction in A rea: 96.90% N/A N/A % Reduction in Volume: Category/Stage IV N/A N/A Classification: Medium N/A N/A Exudate A mount: Serosanguineous N/A N/A Exudate Type: red, brown N/A N/A Exudate Color: Well defined, not attached N/A N/A Wound Margin: Large (67-100%) N/A N/A Granulation A mountLILYEN, [name] ([id number]) [id number] Page 3 of 7 Red N/A N/A Granulation Quality:  None Present (0%) N/A N/A Necrotic Amount: Fat Layer (Subcutaneous Tissue): Yes N/A N/A Exposed Structures: Fascia: No Tendon: No Muscle: No Joint: No Bone: No None N/A N/A Epithelialization: Excoriation: No N/A N/A Periwound Skin Texture: Induration: No Callus: No Crepitus: No Rash: No Scarring: No Maceration: No N/A N/A Periwound Skin Moisture: Dry/Scaly: No Atrophie Blanche: No N/A N/A Periwound Skin Color: Cyanosis: No Ecchymosis: No Erythema: No Hemosiderin Staining: No Mottled: No Pallor: No Rubor: No No Abnormality N/A N/A Temperature: Yes N/A N/A Tenderness on Palpation: Treatment Notes Electronic Signature(s) Signed: 06/27/2023 1:04:14 PM By: Marisa Guess MD FACS Entered By: [name] on 06/27/2023 13:04:14 -------------------------------------------------------------------------------- Multi-Disciplinary Care Plan Details Patient Name: Date of Service: Marisa Roux E. 06/27/2023 12:30 PM Medical Record Number: [id number] Patient Account Number: [id number] Date of Birth/Sex: Treating RN: 1951/01/27 (72 y.o. [name] Primary Care Deara Bober: [name] Other Clinician: Referring Trevyon Swor: Treating  Janelis Stelzer/Extender: [name] in Treatment: 21 Multidisciplinary Care Plan reviewed with physician Active Inactive Wound/Skin Impairment Nursing Diagnoses: Impaired tissue integrity Knowledge deficit related to ulceration/compromised skin integrity Goals: Patient/caregiver will verbalize understanding of skin care regimen Date Initiated: 09/28/2022 Target Resolution Date: 07/25/2023 Goal Status: Active Ulcer/skin breakdown will have a volume reduction of 50% by week 8 Date Initiated: 09/28/2022 Date Inactivated: 11/01/2022 Target Resolution Date: 11/02/2022 Goal Status: Met Ulcer/skin breakdown will have a volume reduction of 80% by week 12 Date Initiated: 11/01/2022 Date Inactivated: 11/29/2022 Target Resolution Date: 11/29/2022 Unmet Reason: vac in place, still Goal Status: Unmet debriding heel Interventions: Assess patient/caregiver ability to obtain necessary supplies Assess patient/caregiver ability to perform ulcer/skin care regimen upon admission and as needed Assess ulceration(s) every visit [name], [name] ([id number]) [id number] Page 4 of 7 Provide education on ulcer and skin care Treatment Activities: Skin care regimen initiated : 09/28/2022 Topical wound management initiated : 09/28/2022 Notes: Electronic Signature(s) Signed: 06/27/2023 5:10:59 PM By: Zenaida Deed RN, BSN Entered By: Zenaida Deed on 06/27/2023 12:51:56 -------------------------------------------------------------------------------- Pain Assessment Details Patient Name: Date of Service: Marisa Roux E. 06/27/2023 12:30 PM Medical Record Number: [id number] Patient Account Number: [id number] Date of Birth/Sex: Treating RN: [birth year] (72 y.o. [name] Primary Care Bora Bost: [name] Other Clinician: Referring Jennine Peddy: Treating Asael Pann/Extender: [name] in Treatment: 58 Active  Problems Location of Pain Severity and Description of Pain Patient Has Paino No Site Locations Rate the pain. Current Pain Level: 0 Pain Management and Medication Current Pain Management: Electronic Signature(s) Signed: 06/27/2023 5:10:59 PM By: Zenaida Deed RN, BSN Entered By: Zenaida Deed on 06/27/2023 12:41:37 -------------------------------------------------------------------------------- Patient/Caregiver Education Details Patient Name: Date of Service: [name] 11/13/2024andnbsp12:30 PM Medical Record Number: [id number] Patient Account Number: [id number] Date of Birth/Gender: Treating RN: 09-04-1950 (73 y.o. [name] Primary Care Physician: [name] Other Clinician: Referring Physician: Treating Physician/Extender: [name], [name] ([id number]) [id number] Page 5 of 7 Cooper in Treatment: 19 Education Assessment Education Provided To: Patient Education Topics Provided Pressure: Methods: Explain/Verbal Responses: Reinforcements needed, State content correctly Wound/Skin Impairment: Methods: Explain/Verbal Responses: Reinforcements needed, State content correctly Electronic Signature(s) Signed: 06/27/2023 5:10:59 PM By: Zenaida Deed RN, BSN Entered By: Zenaida Deed on 06/27/2023 12:52:25 -------------------------------------------------------------------------------- Wound Assessment Details Patient Name: Date of Service: Marisa Roux E. 06/27/2023 12:30 PM Medical Record Number: [id number] Patient Account Number: [id number] Date of Birth/Sex: Treating RN: Mar 23, 1951 (72 y.o. 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 Wound Open Wounding Event:  Pressure Injury Status: Date Acquired: 08/11/2022 Comorbid Chronic sinus problems/congestion, Sleep Apnea, Congestive Heart Cooper Of Treatment: 41 History: Failure, Raynauds, Rheumatoid Arthritis, Neuropathy Clustered Wound: No Photos Wound Measurements Length: (cm) 1 Width: (cm) 2.4 Depth: (cm) 2.2 Area: (cm) 1.885 Volume: (cm) 4.147 % Reduction in Area: 91.4% % Reduction in Volume: 96.9% Epithelialization: None Tunneling: No Undermining: No Wound Description Classification: Category/Stage IV Wound Margin: Well defined, not attached Exudate Amount: Medium Exudate Type: Serosanguineous [name], Marisa E ([id number]) Exudate Color: red, brown Foul Odor After Cleansing: No Slough/Fibrino No [id number] Page 6 of 7 Wound Bed Granulation Amount: Large (67-100%) Exposed Structure Granulation Quality: Red Fascia Exposed: No Necrotic Amount: None Present (0%) Fat Layer (Subcutaneous Tissue) Exposed: Yes Tendon Exposed: No Muscle Exposed: No Joint Exposed: No Bone Exposed: No Periwound Skin Texture Texture Color No Abnormalities Noted: No No Abnormalities Noted: Yes Callus: No Temperature / Pain Crepitus: No Temperature: No Abnormality Excoriation: No Tenderness on Palpation: Yes Induration: No Rash: No Scarring: No Moisture No Abnormalities Noted: Yes Treatment Notes Wound #1 (Gluteus) Wound Laterality: Right Cleanser Wound Cleanser Discharge Instruction: Cleanse the wound with wound cleanser prior to applying a clean dressing using gauze sponges, not tissue or cotton balls. Peri-Wound Care Nystop Powder (Nystatin) Discharge Instruction: Apply Nystop (Nystatin) Powder as needed to irritated skin Zinc Oxide Ointment 30g tube Discharge Instruction: Apply Zinc Oxide to periwound with each dressing change hydrocortisone cream 1% Discharge Instruction: may apply to rash for itching as needed Topical Primary Dressing Vashe Discharge  Instruction: moisten gauze with Vashe and lightly pack into wound Secondary Dressing Zetuvit Plus Silicone Border Dressing 5x5 (in/in) Discharge Instruction: Apply silicone border over primary dressing as directed. Secured With Compression Wrap Compression Stockings Facilities manager) Signed: 06/27/2023 5:10:59 PM By: Zenaida Deed RN, BSN Entered By: Zenaida Deed on 06/27/2023 12:50:13 -------------------------------------------------------------------------------- Vitals Details Patient Name: Date of Service: Marisa Roux E. 06/27/2023 12:30 PM Medical Record Number: [id number] Patient Account Number: [id number] Date of Birth/Sex: Treating RN: [birth year] (72 y.o. [name] Primary Care Yvett Rossel: [name] Other Clinician: NIALANI, [name] ([id number]) [id number] Page 7 of 7 Referring Cassady Turano: Treating Silvio Sausedo/Extender: [name] in Treatment: 41 Vital Signs Time Taken: 12:39 Temperature (F): 97.8 Height (in): 62 Pulse (bpm): 62 Weight (lbs): 207 Respiratory Rate (breaths/min): 18 Body Mass Index (BMI): 37.9 Blood Pressure (mmHg): 134/63 Reference Range: 80 - 120 mg / dl Electronic Signature(s) Signed: 06/27/2023 5:10:59 PM By: Zenaida Deed RN, BSN Entered By: Zenaida Deed on 06/27/2023 12:41:30

## 2023-06-28 NOTE — Progress Notes (Signed)
 Diagnosis: Iron Deficiency Anemia  Provider:  Chilton Greathouse MD  Procedure: IV Push  IV Type: Peripheral, IV Location: R Antecubital  Venofer (Iron Sucrose), Dose: 200 mg  Post Infusion IV Care: Observation period completed and Peripheral IV Discontinued  Discharge: Condition: Good, Destination: Home . AVS Declined  Performed by:  Wyvonne Lenz, RN

## 2023-07-04 DIAGNOSIS — L89314 Pressure ulcer of right buttock, stage 4: Secondary | ICD-10-CM | POA: Diagnosis not present

## 2023-07-04 DIAGNOSIS — I5032 Chronic diastolic (congestive) heart failure: Secondary | ICD-10-CM | POA: Diagnosis not present

## 2023-07-04 DIAGNOSIS — L89313 Pressure ulcer of right buttock, stage 3: Secondary | ICD-10-CM | POA: Diagnosis not present

## 2023-07-04 DIAGNOSIS — N1832 Chronic kidney disease, stage 3b: Secondary | ICD-10-CM | POA: Diagnosis not present

## 2023-07-04 DIAGNOSIS — R7302 Impaired glucose tolerance (oral): Secondary | ICD-10-CM | POA: Diagnosis not present

## 2023-07-05 ENCOUNTER — Ambulatory Visit: Payer: PPO

## 2023-07-05 VITALS — BP 107/62 | HR 57 | Temp 97.9°F | Resp 16 | Ht 62.5 in | Wt 218.8 lb

## 2023-07-05 DIAGNOSIS — D509 Iron deficiency anemia, unspecified: Secondary | ICD-10-CM

## 2023-07-05 DIAGNOSIS — D500 Iron deficiency anemia secondary to blood loss (chronic): Secondary | ICD-10-CM

## 2023-07-05 DIAGNOSIS — D649 Anemia, unspecified: Secondary | ICD-10-CM

## 2023-07-05 MED ORDER — IRON SUCROSE 20 MG/ML IV SOLN
200.0000 mg | Freq: Once | INTRAVENOUS | Status: AC
Start: 2023-07-05 — End: 2023-07-05
  Administered 2023-07-05: 200 mg via INTRAVENOUS
  Filled 2023-07-05: qty 10

## 2023-07-05 MED ORDER — ACETAMINOPHEN 325 MG PO TABS
650.0000 mg | ORAL_TABLET | Freq: Once | ORAL | Status: AC
  Administered 2023-07-05: 650 mg via ORAL
  Filled 2023-07-05: qty 2

## 2023-07-05 MED ORDER — DIPHENHYDRAMINE HCL 25 MG PO CAPS
25.0000 mg | ORAL_CAPSULE | Freq: Once | ORAL | Status: AC
  Administered 2023-07-05: 25 mg via ORAL
  Filled 2023-07-05: qty 1

## 2023-07-05 NOTE — Progress Notes (Signed)
 Diagnosis: Iron Deficiency Anemia  Provider:  Chilton Greathouse MD  Procedure: IV Push  IV Type: Peripheral, IV Location: L Antecubital  Venofer (Iron Sucrose), Dose: 200 mg  Post Infusion IV Care: Observation period completed and Peripheral IV Discontinued  Discharge: Condition: Good, Destination: Home . AVS Declined  Performed by:  Rico Ala, LPN

## 2023-07-09 ENCOUNTER — Encounter (HOSPITAL_BASED_OUTPATIENT_CLINIC_OR_DEPARTMENT_OTHER): Payer: PPO | Admitting: General Surgery

## 2023-07-09 DIAGNOSIS — L89314 Pressure ulcer of right buttock, stage 4: Secondary | ICD-10-CM | POA: Diagnosis not present

## 2023-07-09 NOTE — Progress Notes (Signed)
[name], [name] ([id number]) [phone].pdf Page 1 of 12 Visit Report for 07/09/2023 Chief Complaint Document Details Patient Name: Date of Service: Marisa, Cooper 07/09/2023 12:30 PM Medical Record Number: [id number] Patient Account Number: [id number] Date of Birth/Sex: Treating RN: 02-Oct-1950 (72 y.o. F) Primary Care Provider: Saralyn Pilar Other Clinician: Referring Provider: Treating Provider/Extender: Park Liter in Treatment: 77 Information Obtained from: Patient Chief Complaint Patient is at the clinic for treatment of open pressure ulcers Electronic Signature(s) Signed: 07/09/2023 1:11:19 PM By: Duanne Guess MD FACS Entered By: Duanne Guess on 07/09/2023 10:11:19 -------------------------------------------------------------------------------- Debridement Details Patient Name: Date of Service: [name]. 07/09/2023 12:30 PM Medical Record Number: [id number] Patient Account Number: [id number] Date of Birth/Sex: Treating RN: [birth year] (72 y.o. [name] Primary Care Provider: Saralyn Pilar Other Clinician: Referring Provider: Treating Provider/Extender: Park Liter in Treatment: 43 Debridement Performed for Assessment: Wound #1 Right Gluteus Performed By: Physician Duanne Guess, MD The following information was scribed by: Zenaida Deed The information was scribed for: Duanne Guess Debridement Type: Debridement Level of Consciousness (Pre-procedure): Awake and Alert Pre-procedure Verification/Time Out Yes - 12:55 Taken: Start Time: 12:56 Pain Control: Lidocaine 4% T opical Solution Percent of Wound Bed Debrided: 100% T Area Debrided (cm): otal 1.26 Tissue and other material debrided: Viable, Non-Viable, Slough, Subcutaneous, Slough Level: Skin/Subcutaneous Tissue Debridement Description: Excisional Instrument: Curette Bleeding: Minimum Hemostasis  Achieved: Silver Nitrate Procedural Pain: 2 Post Procedural Pain: 1 Response to Treatment: Procedure was tolerated well Level of Consciousness (Post- Awake and Alert procedure): Post Debridement Measurements of Total Wound Length: (cm) 0.7 Stage: Category/Stage IV Width: (cm) 2.3 Depth: (cm) 1.5 Volume: (cm) 1.897 Character of Wound/Ulcer Post Debridement: Improved [name], [name] ([id number]) [id number] Page 2 of 12 Post Procedure Diagnosis Same as Pre-procedure Electronic Signature(s) Signed: 07/09/2023 1:22:06 PM By: Duanne Guess MD FACS Signed: 07/09/2023 4:25:54 PM By: Zenaida Deed RN, BSN Entered By: Zenaida Deed on 07/09/2023 10:00:34 -------------------------------------------------------------------------------- HPI Details Patient Name: Date of Service: [name]. 07/09/2023 12:30 PM Medical Record Number: [id number] Patient Account Number: [id number] Date of Birth/Sex: Treating RN: 12/24/1950 (72 y.o. F) Primary Care Provider: Saralyn Pilar Other Clinician: Referring Provider: Treating Provider/Extender: Park Liter in Treatment: 71 History of Present Illness HPI Description: ADMISSION 09/07/2023 This is a 72 year old woman with a past medical history notable for obesity, congestive heart failure, Raynaud's syndrome, CKD stage IIIb, osteoporosis, and rheumatoid arthritis. In November 2023, she suffered a fall that resulted in a femur fracture. She was hospitalized for about a week and underwent surgical repair of the fracture. She subsequently developed pressure ulcers on her right buttocks and ischium. She has been receiving home health services and they have been applying Medihoney. They have been reporting the wounds as stage II, but on evaluation, the large ulcer was probably unstageable at the time they were evaluating it but it is clearly a stage IV at this point. The smaller ulcer does have  fat layer exposure and therefore is a stage III. The patient is accompanied by her daughter. She says she has been sleeping on a regular bed but recently ordered an air mattress T opper, but does not have it yet. She is on Ozempic for weight loss and therefore has a poor appetite and struggles to get adequate protein intake. 09/15/2022: The large stage IV ulcer is substantially cleaner this week. The stage III ulcer is a little smaller. Both have slough accumulation. The culture that I  took last week was polymicrobial. The Augmentin that I prescribed was adequate coverage for the species and she continues to take this. We have also ordered Keystone topical antibiotic compound, but this has not yet arrived. 09/21/2022: The stage IV ulcer has some necrotic muscle at the base but is otherwise fairly clean. The stage III ulcer is smaller again with light slough on the surface. She has her Keystone topical antibiotic compound with her today. 09/29/2022: There is still some necrotic muscle at the base of the stage IV ulcer that I was unable to get to last week. The stage III ulcer has healed. She unfortunately has developed a new pressure ulcer on her right heel. 10/06/2022: Still with some devitalized muscle at the base of the stage IV ulcer, but otherwise this wound is looking quite clean. The pressure induced tissue injury on her heel is demarcating and much of the area appears to be epithelialized, but there is still 2 areas that remain questionable. 10/12/2022: The stage IV ulcer is very clean and ready for wound VAC, which will be delivered tomorrow according to the patient. The tissue injury on her heel is drying up and does not feel particularly boggy this week. 10/19/2022: The heel injury continues to improve. There is still some dry eschar present on the more plantar aspect of it. There is some slough accumulation on the surface of the stage IV pressure ulcer. The wound VAC was initiated by home health  last week. 11/01/2022: The gluteal pressure ulcer is very clean without any necrotic tissue or slough. The depth has come in by over half a centimeter. The deep tissue injury on her heel continues to improve. There is still dry eschar that we are painting with Betadine as well as some fresh-looking viable tissue at the margin. 11/15/2022: The gluteal pressure ulcer is clean and contracting. The eschar on her heel ulcer is beginning to separate from the underlying tissue. 11/29/2022: The gluteal pressure ulcer is very clean. The depth has come in by about a centimeter. The eschar has completely separated off of her heel. There is some fibrinous exudate and slough on the heel. It has declared itself as a stage III at this point. 12/13/2022: The heel ulcer is smaller and much cleaner, but still has some non-viable tissue present. The orifice of the gluteal ulcer is contracting, but the depth remains the same. Fortunately, the sponge for the [facility] is being packed appropriately into the full depth of the wound. 12/27/2022: The heel ulcer is smaller, but I think the depth and degree of tissue injury has finally declared itself, with tendon being exposed at the posterior aspect of the ulcer. It is now stage IV. The gluteal ulcer continues to contract circumferentially, but the depth is still unchanged. 01/10/2023: The heel ulcer has contracted further. There is still rubbery slough on the surface. There has been no further tissue breakdown; I think the last of the nonviable tissue was removed at her visit 2 weeks ago. The gluteal ulcer has contracted further circumferentially, but there is still a tunnel that ankles off cranially that remains about the same depth. The tissues appear viable at both sites. 01/24/2023: Unfortunately, the home health nurse that was applying the wound VAC did not put any drape on her skin for the bridge and was applying sponge directly to the patient's skin. This is resulted in significant  tissue breakdown and pain for the patient. Once we were notified of this, we discontinued the wound VAC and they  have been packing the wound with saline moistened gauze. The heel has also deteriorated. Bone is now exposed. 01/30/2023: Her skin looks significantly better this week. It has recovered from the insult caused by applying the sponge directly to it. Her heel looks a little bit better as well. The culture that I took last week grew out group A strep, Proteus mirabilis, and Enterococcus, along with other skin flora. Although Levaquin was the recommended antibiotic, she is on amiodarone and therefore Levaquin is contraindicated. She has been taking Augmentin and is showing signs of improvement. SHAYLIA, MOLDER ([id number]) [phone].pdf Page 3 of 12 02/07/2023: The insult to her skin caused by direct application of wound VAC sponge has nearly completely healed. The gluteal ulcer cavity continues to contract circumferentially, but still probes quite deeply; I suspect the sponge for her VAC is not getting placed completely down into that tunnel. The heel is looking better today. There is more granulation filling in. I did not feel bone today. 02/14/2023: The gluteal ulcer cavity has circumferentially contracted even further, although the depth remains the same. The heel has improved and there is good tissue overlying where the bone had been exposed. Both wounds have some slough on the surface. 02/21/2023: The gluteal ulcer cavity continues to contract and is about half a centimeter shallower this week. The surface is clean without any slough or other debris accumulation. The heel continues to fill in granulation tissue over where the bone had been exposed. There is some slough on the wound surface. 03/07/2023: The gluteal ulcer was measured slightly deeper today, but on palpation, it feels roughly the same. The cavity is certainly more contracted. She was complaining of more pain  in the area and it appears that GranuFoam was applied directly to her skin and she has some irritation from this. The heel ulcer is filling in with excellent granulation tissue. It does not probe to bone any longer. Minimal slough accumulation. 03/21/2023: The gluteal ulcer is shallower and the cavity is tighter. She has some periwound skin breakdown that we have been trying to address by applying DuoDERM to the skin around the wound opening. The home health nurse also added nystatin powder to address some yeasty-looking areas. The heel ulcer is much shallower and smaller. Minimal slough and biofilm buildup. 04/04/2023: The gluteal ulcer continues to get shallower and narrower. The periwound skin looks much better this week. The heel ulcer has a band of epithelialized tissue separating the site into 2 openings. There is very little depth remaining and the wounds are quite clean with minimal slough on the surface. 04/18/2023: The heel ulcer is nearly completely closed. There is just 1 open very superficial area remaining underneath some eschar and slough. The gluteal ulcer still has the same depth at its deepest point, but everything is much narrower and constricted. The wound is clean without any slough. 05/02/2023: The heel ulcer is closed. She has had a recurrence of the rash on her gluteus around the wound. Her home health nurse left the wound VAC off after her visit on Monday because of this. The gluteal ulcer continues to contract and has a very clean surface. 05/16/2023: The gluteal fungal rash has worsened. The patient is complaining of significant itching and discomfort. Her home health nurse has been applying silver alginate to the periwound skin to try and protect it, but she really does not have a whole lot of drainage due to the excellent function of the wound VAC. The wound depth has come  in a bit more and the orifice is narrowing further. 05/30/2023: She has been taking fluconazole and the  fungal rash on her buttocks is improving. She still has skin irritation that may be related to the drape adhesive. The wound itself is smaller and shallower with good granulation tissue on the surface. There are no longer any nooks and crannies in the tissue. 06/13/2023: The depth of the wound has come in by 50%. She has had a fair amount of drainage, likely attributable to the hypertrophic granulation tissue that has accumulated. 06/27/2023: The wound has contracted considerably. The hypertrophic granulation tissue has not reaccumulated. Drainage has decreased markedly. Her skin irritation has completely resolved. 07/09/2023: The depth of the wound has come in considerably and the orifice has contracted. There is a little slough on the surface and some hypertrophic granulation tissue at the orifice. Electronic Signature(s) Signed: 07/09/2023 1:11:48 PM By: Duanne Guess MD FACS Entered By: Duanne Guess on 07/09/2023 10:11:48 -------------------------------------------------------------------------------- Physical Exam Details Patient Name: Date of Service: [name] 07/09/2023 12:30 PM Medical Record Number: [id number] Patient Account Number: [id number] Date of Birth/Sex: Treating RN: [birth year] (72 y.o. F) Primary Care Provider: Saralyn Pilar Other Clinician: Referring Provider: Treating Provider/Extender: Park Liter in Treatment: 67 Constitutional . . . . no acute distress. Respiratory Normal work of breathing on room air.. Notes 07/09/2023: The depth of the wound has come in considerably and the orifice has contracted. There is a little slough on the surface and some hypertrophic granulation tissue at the orifice. Electronic Signature(s) Signed: 07/09/2023 1:12:17 PM By: Duanne Guess MD FACS Entered By: Duanne Guess on 07/09/2023 10:12:17 [name] ([id number]) [id number] Page 4 of  12 -------------------------------------------------------------------------------- Physician Orders Details Patient Name: Date of Service: [name], [name] 07/09/2023 12:30 PM Medical Record Number: [id number] Patient Account Number: [id number] Date of Birth/Sex: Treating RN: December 09, 1950 (72 y.o. [name] Primary Care Provider: Saralyn Pilar Other Clinician: Referring Provider: Treating Provider/Extender: Park Liter in Treatment: 49 Verbal / Phone Orders: Yes Clinician: Zenaida Deed Read Back and Verified: No Diagnosis Coding ICD-10 Coding Code Description L89.314 Pressure ulcer of right buttock, stage 4 I73.00 Raynaud's syndrome without gangrene I50.32 Chronic diastolic (congestive) heart failure N18.32 Chronic kidney disease, stage 3b R73.02 Impaired glucose tolerance (oral) E66.9 Obesity, unspecified Follow-up Appointments Return appointment in 1 month. - Dr. Lady Gary Anesthetic Wound #1 Right Gluteus (In clinic) Topical Lidocaine 4% applied to wound bed Bathing/ Shower/ Hygiene May shower and wash wound with soap and water. Negative Presssure Wound Therapy Wound #1 Right Gluteus Discontinue wound vac. Call the number on the machine for the company to pick up. Off-Loading Heel suspension boot to: - globoped right foot to ambulate Low air-loss mattress (Group 2) - Adapt Turn and reposition every 2 hours - avoid lying on back, stand at least every hour while out of bed, float heels off bed with pillows under calves while in bed Prevalon Boot - right foot esp. while in bed. Additional Orders / Instructions Follow Nutritious Diet - increase protein to 70-80 gms per day, hold ozempic Juven Shake 1-2 times daily. Home Health No change in wound care orders this week; continue Home Health for wound care. May utilize formulary equivalent dressing for wound treatment orders unless otherwise specified. Other Home Health  Orders/Instructions: - [facility] Wound Treatment Wound #1 - Gluteus Wound Laterality: Right Cleanser: Wound Cleanser 3 x Per Week/30 Days Discharge Instructions: Cleanse the wound with wound cleanser prior to  applying a clean dressing using gauze sponges, not tissue or cotton balls. Peri-Wound Care: Nystop Powder (Nystatin) 3 x Per Week/30 Days Discharge Instructions: Apply Nystop (Nystatin) Powder as needed to irritated skin Peri-Wound Care: Zinc Oxide Ointment 30g tube 3 x Per Week/30 Days Discharge Instructions: Apply Zinc Oxide to periwound with each dressing change Peri-Wound Care: hydrocortisone cream 1% 3 x Per Week/30 Days Discharge Instructions: may apply to rash for itching as needed Prim Dressing: Vashe 3 x Per Week/30 Days ary Discharge Instructions: moisten gauze with Vashe and lightly pack into wound Secondary Dressing: Zetuvit Plus Silicone Border Dressing 5x5 (in/in) 3 x Per Week/30 Days [name], [name] ([id number]) [phone].pdf Page 5 of 12 Discharge Instructions: Apply silicone border over primary dressing as directed. Electronic Signature(s) Signed: 07/09/2023 1:22:06 PM By: Duanne Guess MD FACS Entered By: Duanne Guess on 07/09/2023 10:12:33 -------------------------------------------------------------------------------- Problem List Details Patient Name: Date of Service: [name] 07/09/2023 12:30 PM Medical Record Number: [id number] Patient Account Number: [id number] Date of Birth/Sex: Treating RN: [birth year] (72 y.o. [name] Primary Care Provider: Saralyn Pilar Other Clinician: Referring Provider: Treating Provider/Extender: [name] Weeks in Treatment: 58 Active Problems ICD-10 Encounter Code Description Active Date MDM Diagnosis L89.314 Pressure ulcer of right buttock, stage 4 09/07/2022 No Yes I73.00 Raynaud's syndrome without gangrene 09/07/2022 No Yes I50.32 Chronic diastolic  (congestive) heart failure 09/07/2022 No Yes N18.32 Chronic kidney disease, stage 3b 09/07/2022 No Yes R73.02 Impaired glucose tolerance (oral) 09/07/2022 No Yes E66.9 Obesity, unspecified 09/07/2022 No Yes Inactive Problems ICD-10 Code Description Active Date Inactive Date L89.313 Pressure ulcer of right buttock, stage 3 09/07/2022 09/07/2022 Resolved Problems ICD-10 Code Description Active Date Resolved Date L89.614 Pressure ulcer of right heel, stage 4 09/28/2022 09/28/2022 Electronic Signature(s) Signed: 07/09/2023 1:11:06 PM By: Duanne Guess MD FACS Dyas, Signed:E11/25/2024 1:11:06 PM By: Duanne Guess MD Pleas Koch ([id number]) [id number] Page 6 of 12 Entered By: Duanne Guess on 07/09/2023 10:11:06 -------------------------------------------------------------------------------- Progress Note Details Patient Name: Date of Service: [name], [name] 07/09/2023 12:30 PM Medical Record Number: [id number] Patient Account Number: [id number] Date of Birth/Sex: Treating RN: Apr 23, 1951 (72 y.o. F) Primary Care Provider: Saralyn Pilar Other Clinician: Referring Provider: Treating Provider/Extender: Park Liter in Treatment: 10 Subjective Chief Complaint Information obtained from Patient Patient is at the clinic for treatment of open pressure ulcers History of Present Illness (HPI) ADMISSION 09/07/2023 This is a 72 year old woman with a past medical history notable for obesity, congestive heart failure, Raynaud's syndrome, CKD stage IIIb, osteoporosis, and rheumatoid arthritis. In November 2023, she suffered a fall that resulted in a femur fracture. She was hospitalized for about a week and underwent surgical repair of the fracture. She subsequently developed pressure ulcers on her right buttocks and ischium. She has been receiving home health services and they have been applying Medihoney. They have been reporting the wounds  as stage II, but on evaluation, the large ulcer was probably unstageable at the time they were evaluating it but it is clearly a stage IV at this point. The smaller ulcer does have fat layer exposure and therefore is a stage III. The patient is accompanied by her daughter. She says she has been sleeping on a regular bed but recently ordered an air mattress T opper, but does not have it yet. She is on Ozempic for weight loss and therefore has a poor appetite and struggles to get adequate protein intake. 09/15/2022: The large stage IV ulcer is substantially cleaner this week.  The stage III ulcer is a little smaller. Both have slough accumulation. The culture that I took last week was polymicrobial. The Augmentin that I prescribed was adequate coverage for the species and she continues to take this. We have also ordered Keystone topical antibiotic compound, but this has not yet arrived. 09/21/2022: The stage IV ulcer has some necrotic muscle at the base but is otherwise fairly clean. The stage III ulcer is smaller again with light slough on the surface. She has her Keystone topical antibiotic compound with her today. 09/29/2022: There is still some necrotic muscle at the base of the stage IV ulcer that I was unable to get to last week. The stage III ulcer has healed. She unfortunately has developed a new pressure ulcer on her right heel. 10/06/2022: Still with some devitalized muscle at the base of the stage IV ulcer, but otherwise this wound is looking quite clean. The pressure induced tissue injury on her heel is demarcating and much of the area appears to be epithelialized, but there is still 2 areas that remain questionable. 10/12/2022: The stage IV ulcer is very clean and ready for wound VAC, which will be delivered tomorrow according to the patient. The tissue injury on her heel is drying up and does not feel particularly boggy this week. 10/19/2022: The heel injury continues to improve. There is still some  dry eschar present on the more plantar aspect of it. There is some slough accumulation on the surface of the stage IV pressure ulcer. The wound VAC was initiated by home health last week. 11/01/2022: The gluteal pressure ulcer is very clean without any necrotic tissue or slough. The depth has come in by over half a centimeter. The deep tissue injury on her heel continues to improve. There is still dry eschar that we are painting with Betadine as well as some fresh-looking viable tissue at the margin. 11/15/2022: The gluteal pressure ulcer is clean and contracting. The eschar on her heel ulcer is beginning to separate from the underlying tissue. 11/29/2022: The gluteal pressure ulcer is very clean. The depth has come in by about a centimeter. The eschar has completely separated off of her heel. There is some fibrinous exudate and slough on the heel. It has declared itself as a stage III at this point. 12/13/2022: The heel ulcer is smaller and much cleaner, but still has some non-viable tissue present. The orifice of the gluteal ulcer is contracting, but the depth remains the same. Fortunately, the sponge for the [facility] is being packed appropriately into the full depth of the wound. 12/27/2022: The heel ulcer is smaller, but I think the depth and degree of tissue injury has finally declared itself, with tendon being exposed at the posterior aspect of the ulcer. It is now stage IV. The gluteal ulcer continues to contract circumferentially, but the depth is still unchanged. 01/10/2023: The heel ulcer has contracted further. There is still rubbery slough on the surface. There has been no further tissue breakdown; I think the last of the nonviable tissue was removed at her visit 2 weeks ago. The gluteal ulcer has contracted further circumferentially, but there is still a tunnel that ankles off cranially that remains about the same depth. The tissues appear viable at both sites. 01/24/2023: Unfortunately, the home health  nurse that was applying the wound VAC did not put any drape on her skin for the bridge and was applying sponge directly to the patient's skin. This is resulted in significant tissue breakdown and pain  for the patient. Once we were notified of this, we discontinued the wound VAC and they have been packing the wound with saline moistened gauze. The heel has also deteriorated. Bone is now exposed. 01/30/2023: Her skin looks significantly better this week. It has recovered from the insult caused by applying the sponge directly to it. Her heel looks a little bit better as well. The culture that I took last week grew out group A strep, Proteus mirabilis, and Enterococcus, along with other skin flora. Although Levaquin was the recommended antibiotic, she is on amiodarone and therefore Levaquin is contraindicated. She has been taking Augmentin and is showing signs of improvement. 02/07/2023: The insult to her skin caused by direct application of wound VAC sponge has nearly completely healed. The gluteal ulcer cavity continues to contract circumferentially, but still probes quite deeply; I suspect the sponge for her VAC is not getting placed completely down into that tunnel. The heel is looking better today. There is more granulation filling in. I did not feel bone today. [name], [name] ([id number]) [phone].pdf Page 7 of 12 02/14/2023: The gluteal ulcer cavity has circumferentially contracted even further, although the depth remains the same. The heel has improved and there is good tissue overlying where the bone had been exposed. Both wounds have some slough on the surface. 02/21/2023: The gluteal ulcer cavity continues to contract and is about half a centimeter shallower this week. The surface is clean without any slough or other debris accumulation. The heel continues to fill in granulation tissue over where the bone had been exposed. There is some slough on the wound  surface. 03/07/2023: The gluteal ulcer was measured slightly deeper today, but on palpation, it feels roughly the same. The cavity is certainly more contracted. She was complaining of more pain in the area and it appears that GranuFoam was applied directly to her skin and she has some irritation from this. The heel ulcer is filling in with excellent granulation tissue. It does not probe to bone any longer. Minimal slough accumulation. 03/21/2023: The gluteal ulcer is shallower and the cavity is tighter. She has some periwound skin breakdown that we have been trying to address by applying DuoDERM to the skin around the wound opening. The home health nurse also added nystatin powder to address some yeasty-looking areas. The heel ulcer is much shallower and smaller. Minimal slough and biofilm buildup. 04/04/2023: The gluteal ulcer continues to get shallower and narrower. The periwound skin looks much better this week. The heel ulcer has a band of epithelialized tissue separating the site into 2 openings. There is very little depth remaining and the wounds are quite clean with minimal slough on the surface. 04/18/2023: The heel ulcer is nearly completely closed. There is just 1 open very superficial area remaining underneath some eschar and slough. The gluteal ulcer still has the same depth at its deepest point, but everything is much narrower and constricted. The wound is clean without any slough. 05/02/2023: The heel ulcer is closed. She has had a recurrence of the rash on her gluteus around the wound. Her home health nurse left the wound VAC off after her visit on Monday because of this. The gluteal ulcer continues to contract and has a very clean surface. 05/16/2023: The gluteal fungal rash has worsened. The patient is complaining of significant itching and discomfort. Her home health nurse has been applying silver alginate to the periwound skin to try and protect it, but she really does not have a whole lot  of drainage due to the excellent function of the wound VAC. The wound depth has come in a bit more and the orifice is narrowing further. 05/30/2023: She has been taking fluconazole and the fungal rash on her buttocks is improving. She still has skin irritation that may be related to the drape adhesive. The wound itself is smaller and shallower with good granulation tissue on the surface. There are no longer any nooks and crannies in the tissue. 06/13/2023: The depth of the wound has come in by 50%. She has had a fair amount of drainage, likely attributable to the hypertrophic granulation tissue that has accumulated. 06/27/2023: The wound has contracted considerably. The hypertrophic granulation tissue has not reaccumulated. Drainage has decreased markedly. Her skin irritation has completely resolved. 07/09/2023: The depth of the wound has come in considerably and the orifice has contracted. There is a little slough on the surface and some hypertrophic granulation tissue at the orifice. Patient History Information obtained from Patient, Caregiver, Chart. Family History Cancer - Father,Paternal Grandparents, Heart Disease - Mother,Father,Paternal Grandparents, Hypertension - Mother, Stroke - Maternal Grandparents, No family history of Diabetes, Hereditary Spherocytosis, Kidney Disease, Lung Disease, Seizures, Thyroid Problems, Tuberculosis. 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 Medical History Eyes Denies history of Cataracts, Glaucoma, Optic Neuritis Ear/Nose/Mouth/Throat Patient has history of Chronic sinus problems/congestion - chronic rhinitis Denies history of Middle ear problems Respiratory Patient has history of Sleep Apnea - uses CPAP Cardiovascular Patient has history of Congestive Heart Failure Endocrine Denies history of Type I Diabetes, Type II Diabetes Genitourinary Denies  history of End Stage Renal Disease Immunological Patient has history of Raynauds Denies history of Lupus Erythematosus, Scleroderma Integumentary (Skin) Denies history of History of Burn Musculoskeletal Patient has history of Rheumatoid Arthritis Denies history of Gout, Osteoarthritis, Osteomyelitis Neurologic Patient has history of Neuropathy Denies history of Dementia, Quadriplegia, Paraplegia, Seizure Disorder Oncologic Denies history of Received Chemotherapy, Received Radiation Psychiatric Denies history of Anorexia/bulimia, Confinement Anxiety Hospitalization/Surgery History - right femur fx [facility]. - right knee replacement. - left breast mass excision. - left ankle tibia repair. - abdominal hysterectomy. - adnoidectomy/tonsillectomy. - cholecystectomy. - dental implants. - patoid cystectomy. - tubal ligation. Medical A Surgical History Notes nd Constitutional Symptoms (General Health) morbid obesity Cardiovascular hyperlipidemia Gastrointestinal GERD, IBS, eosinophilic esophagitis, diverticulosis, Genitourinary CKD stage 3 [name], [name] ([id number]) [id number].pdf Page 8 of 12 Immunological Sjoegren syndrome, fibromyalgia Objective Constitutional no acute distress. Vitals Time Taken: 12:42 PM, Height: 62 in, Weight: 207 lbs, BMI: 37.9, Temperature: 97.4 F, Pulse: 64 bpm, Respiratory Rate: 18 breaths/min, Blood Pressure: 122/60 mmHg. Respiratory Normal work of breathing on room air.. General Notes: 07/09/2023: The depth of the wound has come in considerably and the orifice has contracted. There is a little slough on the surface and some hypertrophic granulation tissue at the orifice. Integumentary (Hair, Skin) Wound #1 status is Open. Original cause of wound was Pressure Injury. The date acquired was: 08/11/2022. The wound has been in treatment 43 weeks. The wound is located on the Right Gluteus. The wound measures 0.7cm length x 2.3cm width x  1.5cm depth; 1.264cm^2 area and 1.897cm^3 volume. There is Fat Layer (Subcutaneous Tissue) exposed. There is no tunneling or undermining noted. There is a medium amount of serosanguineous drainage noted. The wound margin is well defined and not attached to the wound base. There is large (67-100%) red, hyper - granulation within  the wound bed. There is no necrotic tissue within the wound bed. The periwound skin appearance had no abnormalities noted for moisture. The periwound skin appearance had no abnormalities noted for color. The periwound skin appearance did not exhibit: Callus, Crepitus, Excoriation, Induration, Rash, Scarring. Periwound temperature was noted as No Abnormality. Assessment Active Problems ICD-10 Pressure ulcer of right buttock, stage 4 Raynaud's syndrome without gangrene Chronic diastolic (congestive) heart failure Chronic kidney disease, stage 3b Impaired glucose tolerance (oral) Obesity, unspecified Procedures Wound #1 Pre-procedure diagnosis of Wound #1 is a Pressure Ulcer located on the Right Gluteus . There was a Excisional Skin/Subcutaneous Tissue Debridement with a total area of 1.26 sq cm performed by Duanne Guess, MD. With the following instrument(s): Curette to remove Viable and Non-Viable tissue/material. Material removed includes Subcutaneous Tissue and Slough and after achieving pain control using Lidocaine 4% T opical Solution. No specimens were taken. A time out was conducted at 12:55, prior to the start of the procedure. A Minimum amount of bleeding was controlled with Silver Nitrate. The procedure was tolerated well with a pain level of 2 throughout and a pain level of 1 following the procedure. Post Debridement Measurements: 0.7cm length x 2.3cm width x 1.5cm depth; 1.897cm^3 volume. Post debridement Stage noted as Category/Stage IV. Character of Wound/Ulcer Post Debridement is improved. Post procedure Diagnosis Wound #1: Same as  Pre-Procedure Plan Follow-up Appointments: Return appointment in 1 month. - Dr. Lady Gary Anesthetic: Wound #1 Right Gluteus: (In clinic) Topical Lidocaine 4% applied to wound bed Bathing/ Shower/ Hygiene: May shower and wash wound with soap and water. Negative Presssure Wound Therapy: Wound #1 Right Gluteus: Discontinue wound vac. Call the number on the machine for the company to pick up. MARIJANA, DONNELLY ([id number]) [phone].pdf Page 9 of 12 Off-Loading: Heel suspension boot to: - globoped right foot to ambulate Low air-loss mattress (Group 2) - Adapt Turn and reposition every 2 hours - avoid lying on back, stand at least every hour while out of bed, float heels off bed with pillows under calves while in bed Prevalon Boot - right foot esp. while in bed. Additional Orders / Instructions: Follow Nutritious Diet - increase protein to 70-80 gms per day, hold ozempic Juven Shake 1-2 times daily. Home Health: No change in wound care orders this week; continue Home Health for wound care. May utilize formulary equivalent dressing for wound treatment orders unless otherwise specified. Other Home Health Orders/Instructions: - [facility] WOUND #1: - Gluteus Wound Laterality: Right Cleanser: Wound Cleanser 3 x Per Week/30 Days Discharge Instructions: Cleanse the wound with wound cleanser prior to applying a clean dressing using gauze sponges, not tissue or cotton balls. Peri-Wound Care: Nystop Powder (Nystatin) 3 x Per Week/30 Days Discharge Instructions: Apply Nystop (Nystatin) Powder as needed to irritated skin Peri-Wound Care: Zinc Oxide Ointment 30g tube 3 x Per Week/30 Days Discharge Instructions: Apply Zinc Oxide to periwound with each dressing change Peri-Wound Care: hydrocortisone cream 1% 3 x Per Week/30 Days Discharge Instructions: may apply to rash for itching as needed Prim Dressing: Vashe 3 x Per Week/30 Days ary Discharge Instructions: moisten gauze with  Vashe and lightly pack into wound Secondary Dressing: Zetuvit Plus Silicone Border Dressing 5x5 (in/in) 3 x Per Week/30 Days Discharge Instructions: Apply silicone border over primary dressing as directed. 07/09/2023: The depth of the wound has come in considerably and the orifice has contracted. There is a little slough on the surface and some hypertrophic granulation tissue at the orifice. I used a curette to  debride slough and subcutaneous tissue from the wound. I then chemically cauterized the hypertrophic granulation tissue with silver nitrate. We will continue to pack the wound with Vashe-moistened gauze. Due to clinic scheduling and provider availability, she will follow-up in 1 month. Of note, she is still requiring use of a walker and cannot drive herself anywhere; she still requires the assistance of home health services. Electronic Signature(s) Signed: 07/09/2023 1:17:34 PM By: Duanne Guess MD FACS Entered By: Duanne Guess on 07/09/2023 10:17:34 -------------------------------------------------------------------------------- HxROS Details Patient Name: Date of Service: [name]. 07/09/2023 12:30 PM Medical Record Number: [id number] Patient Account Number: [id number] Date of Birth/Sex: Treating RN: 1951-03-07 (72 y.o. F) Primary Care Provider: Saralyn Pilar Other Clinician: Referring Provider: Treating Provider/Extender: Park Liter in Treatment: 24 Information Obtained From Patient Caregiver Chart Constitutional Symptoms (General Health) Medical History: Past Medical History Notes: morbid obesity Eyes Medical History: Negative for: Cataracts; Glaucoma; Optic Neuritis Ear/Nose/Mouth/Throat Medical History: Positive for: Chronic sinus problems/congestion - chronic rhinitis Negative for: Middle ear problems Respiratory Medical HistoryAMME, LUMPKIN ([id number]) [phone].pdf Page 10 of 12 Positive  for: Sleep Apnea - uses CPAP Cardiovascular Medical History: Positive for: Congestive Heart Failure Past Medical History Notes: hyperlipidemia Gastrointestinal Medical History: Past Medical History Notes: GERD, IBS, eosinophilic esophagitis, diverticulosis, Endocrine Medical History: Negative for: Type I Diabetes; Type II Diabetes Genitourinary Medical History: Negative for: End Stage Renal Disease Past Medical History Notes: CKD stage 3 Immunological Medical History: Positive for: Raynauds Negative for: Lupus Erythematosus; Scleroderma Past Medical History Notes: Sjoegren syndrome, fibromyalgia Integumentary (Skin) Medical History: Negative for: History of Burn Musculoskeletal Medical History: Positive for: Rheumatoid Arthritis Negative for: Gout; Osteoarthritis; Osteomyelitis Neurologic Medical History: Positive for: Neuropathy Negative for: Dementia; Quadriplegia; Paraplegia; Seizure Disorder Oncologic Medical History: Negative for: Received Chemotherapy; Received Radiation Psychiatric Medical History: Negative for: Anorexia/bulimia; Confinement Anxiety HBO Extended History Items Ear/Nose/Mouth/Throat: Chronic sinus problems/congestion Immunizations Pneumococcal Vaccine: Received Pneumococcal Vaccination: Yes Received Pneumococcal Vaccination On or After 60th Birthday: Yes Implantable Devices None Hospitalization / Surgery History Type of Hospitalization/Surgery right femur fx [name], [name] ([id number]) [id number] Page 11 of 12 right knee replacement left breast mass excision left ankle tibia repair abdominal hysterectomy adnoidectomy/tonsillectomy cholecystectomy dental implants patoid cystectomy tubal ligation Family and Social History Cancer: Yes - Father,Paternal Grandparents; Diabetes: No; Heart Disease: Yes - Mother,Father,Paternal Grandparents; Hereditary Spherocytosis: No; Hypertension: Yes - Mother; Kidney  Disease: No; Lung Disease: No; Seizures: No; Stroke: Yes - Maternal Grandparents; Thyroid Problems: No; Tuberculosis: No; Never smoker; Marital Status - Widowed; Alcohol Use: Rarely; Drug Use: Prior History - TCH; Caffeine Use: Daily - soda, tea; Financial Concerns: No; Food, Clothing or Shelter Needs: No; Support System Lacking: No; Transportation Concerns: No Electronic Signature(s) Signed: 07/09/2023 1:22:06 PM By: Duanne Guess MD FACS Entered By: Duanne Guess on 07/09/2023 10:11:56 -------------------------------------------------------------------------------- SuperBill Details Patient Name: Date of Service: [name] 07/09/2023 Medical Record Number: [id number] Patient Account Number: [id number] Date of Birth/Sex: Treating RN: 01-Dec-1950 (72 y.o. F) Primary Care Provider: Saralyn Pilar Other Clinician: Referring Provider: Treating Provider/Extender: Park Liter in Treatment: 43 Diagnosis Coding ICD-10 Codes Code Description L89.314 Pressure ulcer of right buttock, stage 4 I73.00 Raynaud's syndrome without gangrene I50.32 Chronic diastolic (congestive) heart failure N18.32 Chronic kidney disease, stage 3b R73.02 Impaired glucose tolerance (oral) E66.9 Obesity, unspecified Facility Procedures : CPT4 Code: [id number] Description: 11042 - DEB SUBQ TISSUE 20 SQ CM/< ICD-10 Diagnosis Description L89.314 Pressure ulcer of right buttock, stage 4 Modifier:  Quantity: 1 Physician Procedures : CPT4 Code Description Modifier [id number] 99214 - WC PHYS LEVEL 4 - EST PT 25 ICD-10 Diagnosis Description L89.314 Pressure ulcer of right buttock, stage 4 I73.00 Raynaud's syndrome without gangrene I50.32 Chronic diastolic (congestive) heart failure  N18.32 Chronic kidney disease, stage 3b Quantity: 1 : [id number] 11042 - WC PHYS SUBQ TISS 20 SQ CM ICD-10 Diagnosis Description L89.314 Pressure ulcer of right buttock, stage 4 Quantity: 1 Electronic  Signature(s) [name], MALEK ([id number]) [id number] Page 12 of 12 Signed: 07/09/2023 1:18:02 PM By: Duanne Guess MD FACS Entered By: Duanne Guess on 07/09/2023 10:18:01

## 2023-07-09 NOTE — Progress Notes (Signed)
TIMIKO, GAU ([id number]) [id number] Page 1 of 6 Visit Report for 07/09/2023 Arrival Information Details Patient Name: Date of Service: [name], [name] 07/09/2023 12:30 PM Medical Record Number: [id number] Patient Account Number: [id number] Date of Birth/Sex: Treating RN: 04-10-1951 (72 y.o. Billy Coast, [name] Primary Care Gilma Bessette: Saralyn Pilar Other Clinician: Referring Rocsi Hazelbaker: Treating Sabrinia Prien/Extender: [name] in Treatment: 34 Visit Information History Since Last Visit Added or deleted any medications: No Patient Arrived: [name] Any new allergies or adverse reactions: No Arrival Time: 12:38 Had a fall or experienced change in No Accompanied By: son activities of daily living that may affect Transfer Assistance: None risk of falls: Patient Requires Transmission-Based Precautions: No Signs or symptoms of abuse/neglect since last visito No Patient Has Alerts: No Hospitalized since last visit: No Implantable device outside of the clinic excluding No cellular tissue based products placed in the center since last visit: Has Dressing in Place as Prescribed: Yes Pain Present Now: No Electronic Signature(s) Signed: 07/09/2023 4:25:54 PM By: Zenaida Deed RN, BSN Entered By: Zenaida Deed on 07/09/2023 09:43:18 -------------------------------------------------------------------------------- Lower Extremity Assessment Details Patient Name: Date of Service: [name], [name] 07/09/2023 12:30 PM Medical Record Number: [id number] Patient Account Number: [id number] Date of Birth/Sex: Treating RN: 12-25-1950 (72 y.o. [name] Primary Care Amry Cathy: Saralyn Pilar Other Clinician: Referring Hilma Steinhilber: Treating Sunni Richardson/Extender: [name] in Treatment: 20 Electronic Signature(s) Signed: 07/09/2023 4:25:54 PM By: Zenaida Deed RN, BSN Entered By: Zenaida Deed on 07/09/2023  09:44:30 -------------------------------------------------------------------------------- Multi Wound Chart Details Patient Name: Date of Service: [name] 07/09/2023 12:30 PM Medical Record Number: [id number] Patient Account Number: [id number] Date of Birth/Sex: Treating RN: [birth year] (72 y.o. F) Primary Care Rossie Scarfone: Saralyn Pilar Other Clinician: Referring Kyri Shader: Treating [name] Tocco/Extender: [name] in Treatment: [address] [place] ([id number]) [id number] Page 2 of 6 Vital Signs Height(in): 62 Pulse(bpm): 64 Weight(lbs): 207 Blood Pressure(mmHg): 122/60 Body Mass Index(BMI): 37.9 Temperature(F): 97.4 Respiratory Rate(breaths/min): 18 [1:Photos:] [N/A:N/A] Right Gluteus N/A N/A Wound Location: Pressure Injury N/A N/A Wounding Event: Pressure Ulcer N/A N/A Primary Etiology: Chronic sinus problems/congestion, N/A N/A Comorbid History: Sleep Apnea, Congestive Heart Failure, Raynauds, Rheumatoid Arthritis, Neuropathy 08/11/2022 N/A N/A Date Acquired: 70 N/A N/A Weeks of Treatment: Open N/A N/A Wound Status: No N/A N/A Wound Recurrence: 0.7x2.3x1.5 N/A N/A Measurements L x W x D (cm) 1.264 N/A N/A A (cm) : rea 1.897 N/A N/A Volume (cm) : 94.30% N/A N/A % Reduction in A rea: 98.60% N/A N/A % Reduction in Volume: Category/Stage IV N/A N/A Classification: Medium N/A N/A Exudate A mount: Serosanguineous N/A N/A Exudate Type: red, brown N/A N/A Exudate Color: Well defined, not attached N/A N/A Wound Margin: Large (67-100%) N/A N/A Granulation A mount: Red, Hyper-granulation N/A N/A Granulation Quality: None Present (0%) N/A N/A Necrotic A mount: Fat Layer (Subcutaneous Tissue): Yes N/A N/A Exposed Structures: Fascia: No Tendon: No Muscle: No Joint: No Bone: No None N/A N/A Epithelialization: Debridement - Excisional N/A N/A Debridement: Pre-procedure Verification/Time Out 12:55  N/A N/A Taken: Lidocaine 4% T opical Solution N/A N/A Pain Control: Subcutaneous, Slough N/A N/A Tissue Debrided: Skin/Subcutaneous Tissue N/A N/A Level: 1.26 N/A N/A Debridement A (sq cm): rea Curette N/A N/A Instrument: Minimum N/A N/A Bleeding: Silver Nitrate N/A N/A Hemostasis A chieved: 2 N/A N/A Procedural Pain: 1 N/A N/A Post Procedural Pain: Procedure was tolerated well N/A N/A Debridement Treatment Response: 0.7x2.3x1.5 N/A N/A Post Debridement Measurements L x W x D (cm)  1.897 N/A N/A Post Debridement Volume: (cm) Category/Stage IV N/A N/A Post Debridement Stage: Excoriation: No N/A N/A Periwound Skin Texture: Induration: No Callus: No Crepitus: No Rash: No Scarring: No Maceration: No N/A N/A Periwound Skin Moisture: Dry/Scaly: No Atrophie Blanche: No N/A N/A Periwound Skin Color: Cyanosis: No Ecchymosis: No Erythema: No Hemosiderin Staining: No Mottled: No Pallor: No Rubor: No [name], [name] ([id number]) [id number] Page 3 of 6 No Abnormality N/A N/A Temperature: Debridement N/A N/A Procedures Performed: Treatment Notes Electronic Signature(s) Signed: 07/09/2023 1:11:13 PM By: Duanne Guess MD FACS Entered By: Duanne Guess on 07/09/2023 10:11:13 -------------------------------------------------------------------------------- Multi-Disciplinary Care Plan Details Patient Name: Date of Service: Marisa Roux E. 07/09/2023 12:30 PM Medical Record Number: [id number] Patient Account Number: [id number] Date of Birth/Sex: Treating RN: May 13, 1951 (72 y.o. [name] Primary Care Shea Swalley: Saralyn Pilar Other Clinician: Referring [name]: Treating Nisaiah Bechtol/Extender: [name] in Treatment: 43 Multidisciplinary Care Plan reviewed with physician Active Inactive Wound/Skin Impairment Nursing Diagnoses: Impaired tissue integrity Knowledge deficit related to  ulceration/compromised skin integrity Goals: Patient/caregiver will verbalize understanding of skin care regimen Date Initiated: 09/28/2022 Target Resolution Date: 07/25/2023 Goal Status: Active Ulcer/skin breakdown will have a volume reduction of 50% by week 8 Date Initiated: 09/28/2022 Date Inactivated: 11/01/2022 Target Resolution Date: 11/02/2022 Goal Status: Met Ulcer/skin breakdown will have a volume reduction of 80% by week 12 Date Initiated: 11/01/2022 Date Inactivated: 11/29/2022 Target Resolution Date: 11/29/2022 Unmet Reason: vac in place, still Goal Status: Unmet debriding heel Interventions: Assess patient/caregiver ability to obtain necessary supplies Assess patient/caregiver ability to perform ulcer/skin care regimen upon admission and as needed Assess ulceration(s) every visit Provide education on ulcer and skin care Treatment Activities: Skin care regimen initiated : 09/28/2022 Topical wound management initiated : 09/28/2022 Notes: Electronic Signature(s) Signed: 07/09/2023 4:25:54 PM By: Zenaida Deed RN, BSN Entered By: Zenaida Deed on 07/09/2023 09:52:59 [name] ([id number]) [id number] Page 4 of 6 -------------------------------------------------------------------------------- Pain Assessment Details Patient Name: Date of Service: [name], [name] 07/09/2023 12:30 PM Medical Record Number: [id number] Patient Account Number: [id number] Date of Birth/Sex: Treating RN: 04/28/1951 (72 y.o. [name] Primary Care Barby Colvard: Saralyn Pilar Other Clinician: Referring Joshu Furukawa: Treating Naelle Diegel/Extender: [name] in Treatment: 11 Active Problems Location of Pain Severity and Description of Pain Patient Has Paino No Site Locations Rate the pain. Current Pain Level: 0 Pain Management and Medication Current Pain Management: Electronic Signature(s) Signed: 07/09/2023 4:25:54 PM By:  Zenaida Deed RN, BSN Entered By: Zenaida Deed on 07/09/2023 09:43:37 -------------------------------------------------------------------------------- Patient/Caregiver Education Details Patient Name: Date of Service: [name] 11/25/2024andnbsp12:30 PM Medical Record Number: [id number] Patient Account Number: [id number] Date of Birth/Gender: Treating RN: [birth year] (72 y.o. [name] Primary Care Physician: Saralyn Pilar Other Clinician: Referring Physician: Treating Physician/Extender: [name] in Treatment: 60 Education Assessment Education Provided To: Patient Education Topics Provided Pressure: Methods: Explain/Verbal Responses: Reinforcements needed, State content correctly Wound/Skin Impairment: Methods: Explain/Verbal Responses: Reinforcements needed, State content correctly [name], [name] ([id number]) [id number] Page 5 of 6 Electronic Signature(s) Signed: 07/09/2023 4:25:54 PM By: Zenaida Deed RN, BSN Entered By: Zenaida Deed on 07/09/2023 09:53:23 -------------------------------------------------------------------------------- Wound Assessment Details Patient Name: Date of Service: [name], [name] 07/09/2023 12:30 PM Medical Record Number: [id number] Patient Account Number: [id number] Date of Birth/Sex: Treating RN: 06-Dec-1950 (72 y.o. [name] Primary Care Kassondra Geil: Saralyn Pilar Other Clinician: Referring Leanna Hamid: Treating Spyridon Hornstein/Extender: [name] Weeks in Treatment: 06 Wound Status Wound Number:  1 Primary Pressure Ulcer Etiology: Wound Location: Right Gluteus Wound Open Wounding Event: Pressure Injury Status: Date Acquired: 08/11/2022 Comorbid Chronic sinus problems/congestion, Sleep Apnea, Congestive Heart Weeks Of Treatment: 43 History: Failure, Raynauds, Rheumatoid Arthritis, Neuropathy Clustered Wound: No Photos Wound  Measurements Length: (cm) 0.7 Width: (cm) 2.3 Depth: (cm) 1.5 Area: (cm) 1.264 Volume: (cm) 1.897 % Reduction in Area: 94.3% % Reduction in Volume: 98.6% Epithelialization: None Tunneling: No Undermining: No Wound Description Classification: Category/Stage IV Wound Margin: Well defined, not attached Exudate Amount: Medium Exudate Type: Serosanguineous Exudate Color: red, brown Foul Odor After Cleansing: No Slough/Fibrino No Wound Bed Granulation Amount: Large (67-100%) Exposed Structure Granulation Quality: Red, Hyper-granulation Fascia Exposed: No Necrotic Amount: None Present (0%) Fat Layer (Subcutaneous Tissue) Exposed: Yes Tendon Exposed: No Muscle Exposed: No Joint Exposed: No Bone Exposed: No Periwound Skin Texture Texture Color No Abnormalities Noted: No No Abnormalities Noted: Yes Callus: No Temperature / Pain Crepitus: No Temperature: No Abnormality Excoriation: No Induration: No Rash: No YAR, CLOWNEY ([id number]) [id number] Page 6 of 6 Scarring: No Moisture No Abnormalities Noted: Yes Electronic Signature(s) Signed: 07/09/2023 4:25:54 PM By: Zenaida Deed RN, BSN Entered By: Zenaida Deed on 07/09/2023 09:51:49 -------------------------------------------------------------------------------- Vitals Details Patient Name: Date of Service: Marisa Roux E. 07/09/2023 12:30 PM Medical Record Number: [id number] Patient Account Number: [id number] Date of Birth/Sex: Treating RN: 1951-07-14 (72 y.o. [name] Primary Care Sona Nations: Saralyn Pilar Other Clinician: Referring Calil Amor: Treating Tambi Thole/Extender: [name] Weeks in Treatment: 11 Vital Signs Time Taken: 12:42 Temperature (F): 97.4 Height (in): 62 Pulse (bpm): 64 Weight (lbs): 207 Respiratory Rate (breaths/min): 18 Body Mass Index (BMI): 37.9 Blood Pressure (mmHg): 122/60 Reference Range: 80 - 120 mg / dl Electronic  Signature(s) Signed: 07/09/2023 4:25:54 PM By: Zenaida Deed RN, BSN Entered By: Zenaida Deed on 07/09/2023 09:43:05

## 2023-07-10 ENCOUNTER — Other Ambulatory Visit: Payer: Self-pay | Admitting: Cardiology

## 2023-07-10 ENCOUNTER — Other Ambulatory Visit (INDEPENDENT_AMBULATORY_CARE_PROVIDER_SITE_OTHER): Payer: Self-pay | Admitting: Family Medicine

## 2023-07-10 ENCOUNTER — Encounter: Payer: Self-pay | Admitting: Physician Assistant

## 2023-07-10 ENCOUNTER — Ambulatory Visit: Payer: PPO | Attending: Physician Assistant | Admitting: Physician Assistant

## 2023-07-10 VITALS — BP 120/65 | HR 64 | Resp 16 | Ht 62.5 in | Wt 215.8 lb

## 2023-07-10 DIAGNOSIS — M1712 Unilateral primary osteoarthritis, left knee: Secondary | ICD-10-CM | POA: Diagnosis not present

## 2023-07-10 DIAGNOSIS — Z8639 Personal history of other endocrine, nutritional and metabolic disease: Secondary | ICD-10-CM

## 2023-07-10 DIAGNOSIS — L89314 Pressure ulcer of right buttock, stage 4: Secondary | ICD-10-CM

## 2023-07-10 DIAGNOSIS — Z8679 Personal history of other diseases of the circulatory system: Secondary | ICD-10-CM

## 2023-07-10 DIAGNOSIS — M797 Fibromyalgia: Secondary | ICD-10-CM

## 2023-07-10 DIAGNOSIS — M3500 Sicca syndrome, unspecified: Secondary | ICD-10-CM | POA: Diagnosis not present

## 2023-07-10 DIAGNOSIS — L89616 Pressure-induced deep tissue damage of right heel: Secondary | ICD-10-CM | POA: Diagnosis not present

## 2023-07-10 DIAGNOSIS — M533 Sacrococcygeal disorders, not elsewhere classified: Secondary | ICD-10-CM

## 2023-07-10 DIAGNOSIS — N184 Chronic kidney disease, stage 4 (severe): Secondary | ICD-10-CM

## 2023-07-10 DIAGNOSIS — M25511 Pain in right shoulder: Secondary | ICD-10-CM

## 2023-07-10 DIAGNOSIS — E7849 Other hyperlipidemia: Secondary | ICD-10-CM

## 2023-07-10 DIAGNOSIS — Z8669 Personal history of other diseases of the nervous system and sense organs: Secondary | ICD-10-CM

## 2023-07-10 DIAGNOSIS — M51369 Other intervertebral disc degeneration, lumbar region without mention of lumbar back pain or lower extremity pain: Secondary | ICD-10-CM | POA: Diagnosis not present

## 2023-07-10 DIAGNOSIS — G8929 Other chronic pain: Secondary | ICD-10-CM

## 2023-07-10 DIAGNOSIS — Z8719 Personal history of other diseases of the digestive system: Secondary | ICD-10-CM

## 2023-07-10 DIAGNOSIS — M0579 Rheumatoid arthritis with rheumatoid factor of multiple sites without organ or systems involvement: Secondary | ICD-10-CM

## 2023-07-10 DIAGNOSIS — Z8659 Personal history of other mental and behavioral disorders: Secondary | ICD-10-CM

## 2023-07-10 DIAGNOSIS — Z96651 Presence of right artificial knee joint: Secondary | ICD-10-CM | POA: Diagnosis not present

## 2023-07-10 DIAGNOSIS — K200 Eosinophilic esophagitis: Secondary | ICD-10-CM

## 2023-07-10 DIAGNOSIS — M810 Age-related osteoporosis without current pathological fracture: Secondary | ICD-10-CM

## 2023-07-10 DIAGNOSIS — Z79899 Other long term (current) drug therapy: Secondary | ICD-10-CM | POA: Diagnosis not present

## 2023-07-10 DIAGNOSIS — Z8781 Personal history of (healed) traumatic fracture: Secondary | ICD-10-CM

## 2023-07-10 DIAGNOSIS — I7300 Raynaud's syndrome without gangrene: Secondary | ICD-10-CM | POA: Diagnosis not present

## 2023-07-10 MED ORDER — HYDROXYCHLOROQUINE SULFATE 200 MG PO TABS: 200.0000 mg | ORAL_TABLET | Freq: Every day | ORAL | 0 refills | Status: DC

## 2023-07-14 DIAGNOSIS — L89314 Pressure ulcer of right buttock, stage 4: Secondary | ICD-10-CM | POA: Diagnosis not present

## 2023-07-14 DIAGNOSIS — R7302 Impaired glucose tolerance (oral): Secondary | ICD-10-CM | POA: Diagnosis not present

## 2023-07-14 DIAGNOSIS — N1832 Chronic kidney disease, stage 3b: Secondary | ICD-10-CM | POA: Diagnosis not present

## 2023-07-14 DIAGNOSIS — M6281 Muscle weakness (generalized): Secondary | ICD-10-CM | POA: Diagnosis not present

## 2023-07-14 DIAGNOSIS — I5022 Chronic systolic (congestive) heart failure: Secondary | ICD-10-CM | POA: Diagnosis not present

## 2023-07-14 DIAGNOSIS — R2689 Other abnormalities of gait and mobility: Secondary | ICD-10-CM | POA: Diagnosis not present

## 2023-07-14 DIAGNOSIS — L89313 Pressure ulcer of right buttock, stage 3: Secondary | ICD-10-CM | POA: Diagnosis not present

## 2023-07-14 DIAGNOSIS — I5032 Chronic diastolic (congestive) heart failure: Secondary | ICD-10-CM | POA: Diagnosis not present

## 2023-07-14 DIAGNOSIS — S72409D Unspecified fracture of lower end of unspecified femur, subsequent encounter for closed fracture with routine healing: Secondary | ICD-10-CM | POA: Diagnosis not present

## 2023-07-17 ENCOUNTER — Ambulatory Visit: Payer: PPO | Admitting: *Deleted

## 2023-07-17 VITALS — BP 113/63 | HR 57 | Temp 97.9°F | Resp 18 | Ht 62.5 in | Wt 217.4 lb

## 2023-07-17 DIAGNOSIS — D500 Iron deficiency anemia secondary to blood loss (chronic): Secondary | ICD-10-CM

## 2023-07-17 DIAGNOSIS — D649 Anemia, unspecified: Secondary | ICD-10-CM | POA: Diagnosis not present

## 2023-07-17 MED ORDER — DIPHENHYDRAMINE HCL 25 MG PO CAPS
25.0000 mg | ORAL_CAPSULE | Freq: Once | ORAL | Status: AC
  Administered 2023-07-17: 25 mg via ORAL
  Filled 2023-07-17: qty 1

## 2023-07-17 MED ORDER — ACETAMINOPHEN 325 MG PO TABS
650.0000 mg | ORAL_TABLET | Freq: Once | ORAL | Status: AC
  Administered 2023-07-17: 650 mg via ORAL
  Filled 2023-07-17: qty 2

## 2023-07-17 MED ORDER — IRON SUCROSE 20 MG/ML IV SOLN
200.0000 mg | Freq: Once | INTRAVENOUS | Status: AC
  Administered 2023-07-17: 200 mg via INTRAVENOUS
  Filled 2023-07-17: qty 10

## 2023-07-17 NOTE — Addendum Note (Signed)
Addended by: Primus Bravo E on: 07/17/2023 03:11 PM   Modules accepted: Orders

## 2023-07-17 NOTE — Progress Notes (Signed)
 Diagnosis: Iron Deficiency Anemia  Provider:  Chilton Greathouse MD  Procedure: IV Push  IV Type: Peripheral, IV Location: R Antecubital  Venofer (Iron Sucrose), Dose: 200 mg  Post Infusion IV Care: Observation period completed and Peripheral IV Discontinued  Discharge: Condition: Good, Destination: Home . AVS Declined  Performed by:  Forrest Moron, RN

## 2023-07-25 ENCOUNTER — Telehealth: Payer: Self-pay | Admitting: Cardiology

## 2023-07-25 ENCOUNTER — Other Ambulatory Visit: Payer: Self-pay

## 2023-07-25 ENCOUNTER — Encounter (HOSPITAL_BASED_OUTPATIENT_CLINIC_OR_DEPARTMENT_OTHER): Payer: PPO | Admitting: Internal Medicine

## 2023-07-25 DIAGNOSIS — E7849 Other hyperlipidemia: Secondary | ICD-10-CM

## 2023-07-25 MED ORDER — FENOFIBRATE 48 MG PO TABS: 48.0000 mg | ORAL_TABLET | Freq: Every day | ORAL | 0 refills | Status: DC

## 2023-07-25 NOTE — Telephone Encounter (Signed)
*  STAT* If patient is at the pharmacy, call can be transferred to refill team.   1. Which medications need to be refilled? (please list name of each medication and dose if known)  fenofibrate (TRICOR) 48 MG tablet  2. Which pharmacy/location (including street and city if local pharmacy) is medication to be sent to? [facility] - [place], [place] - [address] Phone: [phone]  Fax: [phone]     3. Do they need a 30 day or 90 day supply? 90

## 2023-07-30 ENCOUNTER — Ambulatory Visit (INDEPENDENT_AMBULATORY_CARE_PROVIDER_SITE_OTHER): Payer: PPO | Admitting: Family Medicine

## 2023-07-30 ENCOUNTER — Encounter: Payer: Self-pay | Admitting: Family Medicine

## 2023-07-30 VITALS — BP 127/66 | HR 64 | Resp 18 | Ht 62.5 in | Wt 213.0 lb

## 2023-07-30 DIAGNOSIS — F418 Other specified anxiety disorders: Secondary | ICD-10-CM | POA: Diagnosis not present

## 2023-07-30 DIAGNOSIS — Z23 Encounter for immunization: Secondary | ICD-10-CM | POA: Diagnosis not present

## 2023-07-30 DIAGNOSIS — M797 Fibromyalgia: Secondary | ICD-10-CM | POA: Diagnosis not present

## 2023-07-30 DIAGNOSIS — M79604 Pain in right leg: Secondary | ICD-10-CM | POA: Diagnosis not present

## 2023-07-30 DIAGNOSIS — L98429 Non-pressure chronic ulcer of back with unspecified severity: Secondary | ICD-10-CM | POA: Insufficient documentation

## 2023-07-30 DIAGNOSIS — E782 Mixed hyperlipidemia: Secondary | ICD-10-CM | POA: Diagnosis not present

## 2023-07-30 DIAGNOSIS — S7291XD Unspecified fracture of right femur, subsequent encounter for closed fracture with routine healing: Secondary | ICD-10-CM | POA: Diagnosis not present

## 2023-07-30 DIAGNOSIS — R7303 Prediabetes: Secondary | ICD-10-CM | POA: Diagnosis not present

## 2023-07-30 MED ORDER — LORAZEPAM 0.5 MG PO TABS: ORAL_TABLET | ORAL | 1 refills | Status: DC

## 2023-07-30 MED ORDER — METHOCARBAMOL 500 MG PO TABS: 500.0000 mg | ORAL_TABLET | Freq: Four times a day (QID) | ORAL | 1 refills | Status: DC | PRN

## 2023-07-30 NOTE — Progress Notes (Signed)
Established Patient Office Visit  Subjective   Patient ID: [name], female    DOB: 1950/12/10  Age: 72 y.o. MRN: [id number]  Chief Complaint  Patient presents with   Hyperlipidemia   Fibromyalgia    HPI [name] is a 72 y.o. female presenting today for follow up of hyperlipidemia, fibromyalgia.  Her sacral ulcer is healing well overall.  She does feel that between the ulcer and the right femur fracture, she has lost a lot of strength in her legs.  She is interested in starting physical therapy to regain strength and hopefully reduce the need for a walker.  She continues to occasionally take half of the lorazepam 0.5 mg tablet prior to wound/dressing changes.  Outpatient Medications Prior to Visit  Medication Sig   amiodarone (PACERONE) 200 MG tablet TAKE 1 TABLET (200 MG TOTAL) BY MOUTH DAILY.   aspirin EC 81 MG tablet Take 81 mg by mouth at bedtime.   cetirizine (ZYRTEC) 10 MG tablet TAKE 1 TABLET (10 MG TOTAL) BY MOUTH DAILY.   colestipol (COLESTID) 1 g tablet Take 2 g by mouth as needed.   cycloSPORINE (RESTASIS) 0.05 % ophthalmic emulsion Place 2 drops into both eyes 2 (two) times daily as needed (irritation).   diphenoxylate-atropine (LOMOTIL) 2.5-0.025 MG per tablet Take 1 tablet by mouth 4 (four) times daily as needed for diarrhea or loose stools.   doxylamine, Sleep, (UNISOM) 25 MG tablet Take 1 tablet (25 mg total) by mouth at bedtime as needed.   fenofibrate (TRICOR) 48 MG tablet Take 1 tablet (48 mg total) by mouth daily. Pt needs to keep upcoming appt in Mar, 2025 for further refills   fluticasone (FLONASE) 50 MCG/ACT nasal spray Place 1 spray into both nostrils daily. (Patient taking differently: Place 1 spray into both nostrils as needed for allergies.)   folic acid (FOLVITE) 1 MG tablet Take 1 tablet (1 mg total) by mouth daily.   gabapentin (NEURONTIN) 300 MG capsule Take 2 capsules (600 mg total) by mouth 2 (two) times daily.   hydroxychloroquine (PLAQUENIL)  200 MG tablet Take 1 tablet (200 mg total) by mouth daily.   hyoscyamine (ANASPAZ) 0.125 MG TBDP disintergrating tablet Place 0.25 mg under the tongue every 6 (six) hours as needed for cramping.    IBU 800 MG tablet TAKE 1 TABLET BY MOUTH EVERY 6 HOURS AS NEEDED FOR MILD PAIN.   lidocaine (LIDODERM) 5 % PLACE 1 PATCH ONTO THE SKIN DAILY AS NEEDED FOR PAIN. REMOVE AND DISCARD PATCH WITHIN 12 HOURS OR AS DIRECTED BY MD. (Patient taking differently: Place 1 patch onto the skin 3 (three) times daily as needed (pain).)   metoprolol succinate (TOPROL-XL) 25 MG 24 hr tablet TAKE 1/2 TABLET BY MOUTH (12.5 MG TOTAL) DAILY   Multiple Vitamin (MULTIVITAMIN WITH MINERALS) TABS tablet Take 1 tablet by mouth daily.   NONFORMULARY OR COMPOUNDED ITEM Triamcinolone 0.1% & Silvadene cream 1:1- Apply as directed to affected areas as needed   nystatin (MYCOSTATIN/NYSTOP) powder Apply 1 Application topically 3 (three) times daily. (Patient taking differently: Apply 1 Application topically as needed.)   omeprazole (PRILOSEC) 40 MG capsule Take 40 mg by mouth as needed (nausea).   oxyCODONE (OXY IR/ROXICODONE) 5 MG immediate release tablet Take 1 tablet (5 mg total) by mouth every 6 (six) hours as needed for breakthrough pain ((for MODERATE breakthrough pain)).   potassium chloride (KLOR-CON) 10 MEQ tablet Take 2 tablets (20 mEq total) by mouth daily.   promethazine (PHENERGAN)  25 MG tablet TAKE 1 TABLET BY MOUTH EVERY 6 HOURS AS NEEDED.   rosuvastatin (CRESTOR) 10 MG tablet TAKE 1 TABLET BY MOUTH DAILY.   spironolactone (ALDACTONE) 25 MG tablet TAKE 1 TABLET (25 MG TOTAL) BY MOUTH DAILY.   torsemide 40 MG TABS Take 40 mg by mouth daily.   [DISCONTINUED] amoxicillin-clavulanate (AUGMENTIN) 875-125 MG tablet Take 1 tablet by mouth 2 (two) times daily. Start 2 days prior to dental procedure   [DISCONTINUED] fluconazole (DIFLUCAN) 200 MG tablet Take 200 mg by mouth once a week. (Patient not taking: Reported on 07/10/2023)    [DISCONTINUED] LORazepam (ATIVAN) 0.5 MG tablet TAKE 1 TABLET BY MOUTH ONCE DAILY 1 HOUR PRIOR TO DRESSING CHANGES (Patient not taking: Reported on 07/10/2023)   [DISCONTINUED] methocarbamol (ROBAXIN) 500 MG tablet TAKE 1 TABLET BY MOUTH EVERY 6 HOURS AS NEEDED FOR MUSCLE SPASMS.   No facility-administered medications prior to visit.    ROS Negative unless otherwise noted in HPI   Objective:     BP 127/66 (BP Location: Left Arm, Patient Position: Sitting, Cuff Size: Large)   Pulse 64   Resp 18   Ht 5' 2.5" (1.588 m)   Wt 213 lb (96.6 kg)   SpO2 97%   BMI 38.34 kg/m   Physical Exam Constitutional:      General: She is not in acute distress.    Appearance: Normal appearance.  HENT:     Head: Normocephalic and atraumatic.  Cardiovascular:     Rate and Rhythm: Normal rate and regular rhythm.     Heart sounds: No murmur heard.    No friction rub. No gallop.  Pulmonary:     Effort: Pulmonary effort is normal. No respiratory distress.     Breath sounds: No wheezing, rhonchi or rales.  Skin:    General: Skin is warm and dry.  Neurological:     Mental Status: She is alert and oriented to person, place, and time.     Assessment & Plan:  Mixed hyperlipidemia Assessment & Plan: Last lipid panel: LDL 59, HDL 49, triglycerides 146.  At goal.  Continue fenofibrate 48 mg daily, rosuvastatin 10 mg daily.  Will continue to monitor in coordination with cardiology.  Orders: -     CBC with Differential/Platelet; Future -     Comprehensive metabolic panel; Future -     Lipid panel; Future  Prediabetes Assessment & Plan: Last A1c 5.4, well-controlled.  Continue weight loss efforts with diet changes including monitoring carbohydrates and sugar intake.   Orders: -     Hemoglobin A1c; Future  Fibromyalgia -     CBC with Differential/Platelet; Future -     Comprehensive metabolic panel; Future  Need for pneumococcal 20-valent conjugate vaccination -     Pneumococcal conjugate  vaccine 20-valent  Aftercare for healing traumatic fracture of right femur -     Methocarbamol; Take 1 tablet (500 mg total) by mouth every 6 (six) hours as needed for muscle spasms.  Dispense: 120 tablet; Refill: 1 -     Ambulatory referral to Physical Therapy  Pain in right leg -     Methocarbamol; Take 1 tablet (500 mg total) by mouth every 6 (six) hours as needed for muscle spasms.  Dispense: 120 tablet; Refill: 1  Situational anxiety -     LORazepam; TAKE 1 TABLET BY MOUTH ONCE DAILY 1 HOUR PRIOR TO DRESSING CHANGES  Dispense: 15 tablet; Refill: 1 -     CBC with Differential/Platelet; Future -  Comprehensive metabolic panel; Future  Provided refills of methocarbamol and lorazepam.  Will refill oxycodone for wound changes if needed in the future.  Patient also agreeable to completing pneumococcal vaccine series with PCV 20 today.  Will repeat labs prior to next appointment.  Return in about 3 months (around 10/28/2023) for follow-up for HLD, preDM, fibromyalgia, fasting blood work 1 week before.    Melida Quitter, PA

## 2023-07-30 NOTE — Assessment & Plan Note (Signed)
Last A1c 5.4, well-controlled.  Continue weight loss efforts with diet changes including monitoring carbohydrates and sugar intake.

## 2023-07-30 NOTE — Assessment & Plan Note (Signed)
Last lipid panel: LDL 59, HDL 49, triglycerides 146.  At goal.  Continue fenofibrate 48 mg daily, rosuvastatin 10 mg daily.  Will continue to monitor in coordination with cardiology.

## 2023-08-01 DIAGNOSIS — I5032 Chronic diastolic (congestive) heart failure: Secondary | ICD-10-CM | POA: Diagnosis not present

## 2023-08-01 DIAGNOSIS — E876 Hypokalemia: Secondary | ICD-10-CM | POA: Diagnosis not present

## 2023-08-01 DIAGNOSIS — S71009A Unspecified open wound, unspecified hip, initial encounter: Secondary | ICD-10-CM | POA: Diagnosis not present

## 2023-08-01 DIAGNOSIS — N1831 Chronic kidney disease, stage 3a: Secondary | ICD-10-CM | POA: Diagnosis not present

## 2023-08-01 DIAGNOSIS — S71109A Unspecified open wound, unspecified thigh, initial encounter: Secondary | ICD-10-CM | POA: Diagnosis not present

## 2023-08-01 LAB — BASIC METABOLIC PANEL
BUN: 35 — AB (ref 4–21)
CO2: 23 — AB (ref 13–22)
Chloride: 106 (ref 99–108)
Creatinine: 1.3 — AB (ref 0.5–1.1)
Glucose: 89
Potassium: 4.7 meq/L (ref 3.5–5.1)
Sodium: 139 (ref 137–147)

## 2023-08-01 LAB — IRON,TIBC AND FERRITIN PANEL
%SAT: 19
Ferritin: 393
Iron: 48
TIBC: 249
UIBC: 201

## 2023-08-01 LAB — COMPREHENSIVE METABOLIC PANEL
Albumin: 4 (ref 3.5–5.0)
Calcium: 9.7 (ref 8.7–10.7)

## 2023-08-01 LAB — CBC AND DIFFERENTIAL: Hemoglobin: 11 — AB (ref 12.0–16.0)

## 2023-08-02 LAB — LAB REPORT - SCANNED: EGFR: 44

## 2023-08-03 DIAGNOSIS — I5032 Chronic diastolic (congestive) heart failure: Secondary | ICD-10-CM | POA: Diagnosis not present

## 2023-08-03 DIAGNOSIS — R7302 Impaired glucose tolerance (oral): Secondary | ICD-10-CM | POA: Diagnosis not present

## 2023-08-03 DIAGNOSIS — L89313 Pressure ulcer of right buttock, stage 3: Secondary | ICD-10-CM | POA: Diagnosis not present

## 2023-08-03 DIAGNOSIS — N1832 Chronic kidney disease, stage 3b: Secondary | ICD-10-CM | POA: Diagnosis not present

## 2023-08-03 DIAGNOSIS — L89314 Pressure ulcer of right buttock, stage 4: Secondary | ICD-10-CM | POA: Diagnosis not present

## 2023-08-06 ENCOUNTER — Other Ambulatory Visit: Payer: Self-pay | Admitting: Family Medicine

## 2023-08-06 DIAGNOSIS — S7291XD Unspecified fracture of right femur, subsequent encounter for closed fracture with routine healing: Secondary | ICD-10-CM

## 2023-08-06 DIAGNOSIS — M79604 Pain in right leg: Secondary | ICD-10-CM

## 2023-08-09 ENCOUNTER — Encounter (HOSPITAL_BASED_OUTPATIENT_CLINIC_OR_DEPARTMENT_OTHER): Payer: PPO | Attending: General Surgery | Admitting: General Surgery

## 2023-08-09 DIAGNOSIS — I5032 Chronic diastolic (congestive) heart failure: Secondary | ICD-10-CM | POA: Insufficient documentation

## 2023-08-09 DIAGNOSIS — M069 Rheumatoid arthritis, unspecified: Secondary | ICD-10-CM | POA: Insufficient documentation

## 2023-08-09 DIAGNOSIS — Z6837 Body mass index (BMI) 37.0-37.9, adult: Secondary | ICD-10-CM | POA: Diagnosis not present

## 2023-08-09 DIAGNOSIS — I7300 Raynaud's syndrome without gangrene: Secondary | ICD-10-CM | POA: Insufficient documentation

## 2023-08-09 DIAGNOSIS — R7302 Impaired glucose tolerance (oral): Secondary | ICD-10-CM | POA: Insufficient documentation

## 2023-08-09 DIAGNOSIS — N1832 Chronic kidney disease, stage 3b: Secondary | ICD-10-CM | POA: Diagnosis not present

## 2023-08-09 DIAGNOSIS — Z7985 Long-term (current) use of injectable non-insulin antidiabetic drugs: Secondary | ICD-10-CM | POA: Diagnosis not present

## 2023-08-09 DIAGNOSIS — L89314 Pressure ulcer of right buttock, stage 4: Secondary | ICD-10-CM | POA: Diagnosis not present

## 2023-08-09 DIAGNOSIS — E669 Obesity, unspecified: Secondary | ICD-10-CM | POA: Insufficient documentation

## 2023-08-09 NOTE — Progress Notes (Addendum)
BREEANA, FUGUA ([id number]) [id number] Page 1 of 9 Visit Report for 08/09/2023 Chief Complaint Document Details Patient Name: Date of Service: IOLENE, POMAR 08/09/2023 3:00 PM Medical Record Number: [id number] Patient Account Number: [id number] Date of Birth/Sex: Treating RN: 07-20-1951 (72 y.o. F) Primary Care Provider: Saralyn Pilar Other Clinician: Referring Provider: Treating Provider/Extender: Park Liter in Treatment: 48 Information Obtained from: Patient Chief Complaint Patient is at the clinic for treatment of open pressure ulcers Electronic Signature(s) Signed: 08/09/2023 3:44:17 PM By: Duanne Guess MD FACS Entered By: Duanne Guess on 08/09/2023 15:35:35 -------------------------------------------------------------------------------- Debridement Details Patient Name: Date of Service: [name] 08/09/2023 3:00 PM Medical Record Number: [id number] Patient Account Number: [id number] Date of Birth/Sex: Treating RN: 02-07-1951 (72 y.o. Orville Govern Primary Care Provider: Saralyn Pilar Other Clinician: Referring Provider: Treating Provider/Extender: Park Liter in Treatment: 48 Debridement Performed for Assessment: Wound #1 Right Gluteus Performed By: Physician Duanne Guess, MD The following information was scribed by: Redmond Pulling The information was scribed for: Duanne Guess Debridement Type: Debridement Level of Consciousness (Pre-procedure): Awake and Alert Pre-procedure Verification/Time Out Yes - 15:27 Taken: Start Time: 15:27 Pain Control: Lidocaine 4% T opical Solution Percent of Wound Bed Debrided: 100% T Area Debrided (cm): otal 0.59 Tissue and other material debrided: Non-Viable, Slough, Subcutaneous, Slough Level: Skin/Subcutaneous Tissue Debridement Description: Excisional Instrument: Curette Bleeding: Minimum Hemostasis Achieved:  Pressure Response to Treatment: Procedure was tolerated well Level of Consciousness (Post- Awake and Alert procedure): Post Debridement Measurements of Total Wound Length: (cm) 0.5 Stage: Category/Stage IV Width: (cm) 1.5 Depth: (cm) 1.4 Volume: (cm) 0.825 Character of Wound/Ulcer Post Debridement: Improved Post Procedure Diagnosis AVARAE, LENGERICH E ([id number]) [id number] Page 2 of 9 Same as Pre-procedure Electronic Signature(s) Signed: 08/09/2023 3:44:17 PM By: Duanne Guess MD FACS Signed: 08/09/2023 3:45:54 PM By: Redmond Pulling RN, BSN Entered By: Redmond Pulling on 08/09/2023 15:28:37 -------------------------------------------------------------------------------- HPI Details Patient Name: Date of Service: [name] 08/09/2023 3:00 PM Medical Record Number: [id number] Patient Account Number: [id number] Date of Birth/Sex: Treating RN: 1950/12/12 (72 y.o. F) Primary Care Provider: Saralyn Pilar Other Clinician: Referring Provider: Treating Provider/Extender: Park Liter in Treatment: 48 History of Present Illness HPI Description: ADMISSION 09/07/2023 This is a 72 year old woman with a past medical history notable for obesity, congestive heart failure, Raynaud's syndrome, CKD stage IIIb, osteoporosis, and rheumatoid arthritis. In November 2023, she suffered a fall that resulted in a femur fracture. She was hospitalized for about a week and underwent surgical repair of the fracture. She subsequently developed pressure ulcers on her right buttocks and ischium. She has been receiving home health services and they have been applying Medihoney. They have been reporting the wounds as stage II, but on evaluation, the large ulcer was probably unstageable at the time they were evaluating it but it is clearly a stage IV at this point. The smaller ulcer does have fat layer exposure and therefore is a stage III. The patient  is accompanied by her daughter. She says she has been sleeping on a regular bed but recently ordered an air mattress T opper, but does not have it yet. She is on Ozempic for weight loss and therefore has a poor appetite and struggles to get adequate protein intake. 09/15/2022: The large stage IV ulcer is substantially cleaner this week. The stage III ulcer is a little smaller. Both have slough accumulation. The culture that I took last week was polymicrobial. The Augmentin that I  prescribed was adequate coverage for the species and she continues to take this. We have also ordered Keystone topical antibiotic compound, but this has not yet arrived. 09/21/2022: The stage IV ulcer has some necrotic muscle at the base but is otherwise fairly clean. The stage III ulcer is smaller again with light slough on the surface. She has her Keystone topical antibiotic compound with her today. 09/29/2022: There is still some necrotic muscle at the base of the stage IV ulcer that I was unable to get to last week. The stage III ulcer has healed. She unfortunately has developed a new pressure ulcer on her right heel. 10/06/2022: Still with some devitalized muscle at the base of the stage IV ulcer, but otherwise this wound is looking quite clean. The pressure induced tissue injury on her heel is demarcating and much of the area appears to be epithelialized, but there is still 2 areas that remain questionable. 10/12/2022: The stage IV ulcer is very clean and ready for wound VAC, which will be delivered tomorrow according to the patient. The tissue injury on her heel is drying up and does not feel particularly boggy this week. 10/19/2022: The heel injury continues to improve. There is still some dry eschar present on the more plantar aspect of it. There is some slough accumulation on the surface of the stage IV pressure ulcer. The wound VAC was initiated by home health last week. 11/01/2022: The gluteal pressure ulcer is very clean  without any necrotic tissue or slough. The depth has come in by over half a centimeter. The deep tissue injury on her heel continues to improve. There is still dry eschar that we are painting with Betadine as well as some fresh-looking viable tissue at the margin. 11/15/2022: The gluteal pressure ulcer is clean and contracting. The eschar on her heel ulcer is beginning to separate from the underlying tissue. 11/29/2022: The gluteal pressure ulcer is very clean. The depth has come in by about a centimeter. The eschar has completely separated off of her heel. There is some fibrinous exudate and slough on the heel. It has declared itself as a stage III at this point. 12/13/2022: The heel ulcer is smaller and much cleaner, but still has some non-viable tissue present. The orifice of the gluteal ulcer is contracting, but the depth remains the same. Fortunately, the sponge for the [facility] is being packed appropriately into the full depth of the wound. 12/27/2022: The heel ulcer is smaller, but I think the depth and degree of tissue injury has finally declared itself, with tendon being exposed at the posterior aspect of the ulcer. It is now stage IV. The gluteal ulcer continues to contract circumferentially, but the depth is still unchanged. 01/10/2023: The heel ulcer has contracted further. There is still rubbery slough on the surface. There has been no further tissue breakdown; I think the last of the nonviable tissue was removed at her visit 2 weeks ago. The gluteal ulcer has contracted further circumferentially, but there is still a tunnel that ankles off cranially that remains about the same depth. The tissues appear viable at both sites. 01/24/2023: Unfortunately, the home health nurse that was applying the wound VAC did not put any drape on her skin for the bridge and was applying sponge directly to the patient's skin. This is resulted in significant tissue breakdown and pain for the patient. Once we were notified  of this, we discontinued the wound VAC and they have been packing the wound with saline moistened gauze.  The heel has also deteriorated. Bone is now exposed. 01/30/2023: Her skin looks significantly better this week. It has recovered from the insult caused by applying the sponge directly to it. Her heel looks a little bit better as well. The culture that I took last week grew out group A strep, Proteus mirabilis, and Enterococcus, along with other skin flora. Although Levaquin was the recommended antibiotic, she is on amiodarone and therefore Levaquin is contraindicated. She has been taking Augmentin and is showing signs of improvement. 02/07/2023: The insult to her skin caused by direct application of wound VAC sponge has nearly completely healed. The gluteal ulcer cavity continues to JULI, CARRO ([id number]) [phone].pdf Page 3 of 9 contract circumferentially, but still probes quite deeply; I suspect the sponge for her VAC is not getting placed completely down into that tunnel. The heel is looking better today. There is more granulation filling in. I did not feel bone today. 02/14/2023: The gluteal ulcer cavity has circumferentially contracted even further, although the depth remains the same. The heel has improved and there is good tissue overlying where the bone had been exposed. Both wounds have some slough on the surface. 02/21/2023: The gluteal ulcer cavity continues to contract and is about half a centimeter shallower this week. The surface is clean without any slough or other debris accumulation. The heel continues to fill in granulation tissue over where the bone had been exposed. There is some slough on the wound surface. 03/07/2023: The gluteal ulcer was measured slightly deeper today, but on palpation, it feels roughly the same. The cavity is certainly more contracted. She was complaining of more pain in the area and it appears that GranuFoam was applied directly to  her skin and she has some irritation from this. The heel ulcer is filling in with excellent granulation tissue. It does not probe to bone any longer. Minimal slough accumulation. 03/21/2023: The gluteal ulcer is shallower and the cavity is tighter. She has some periwound skin breakdown that we have been trying to address by applying DuoDERM to the skin around the wound opening. The home health nurse also added nystatin powder to address some yeasty-looking areas. The heel ulcer is much shallower and smaller. Minimal slough and biofilm buildup. 04/04/2023: The gluteal ulcer continues to get shallower and narrower. The periwound skin looks much better this week. The heel ulcer has a band of epithelialized tissue separating the site into 2 openings. There is very little depth remaining and the wounds are quite clean with minimal slough on the surface. 04/18/2023: The heel ulcer is nearly completely closed. There is just 1 open very superficial area remaining underneath some eschar and slough. The gluteal ulcer still has the same depth at its deepest point, but everything is much narrower and constricted. The wound is clean without any slough. 05/02/2023: The heel ulcer is closed. She has had a recurrence of the rash on her gluteus around the wound. Her home health nurse left the wound VAC off after her visit on Monday because of this. The gluteal ulcer continues to contract and has a very clean surface. 05/16/2023: The gluteal fungal rash has worsened. The patient is complaining of significant itching and discomfort. Her home health nurse has been applying silver alginate to the periwound skin to try and protect it, but she really does not have a whole lot of drainage due to the excellent function of the wound VAC. The wound depth has come in a bit more and the orifice is narrowing  further. 05/30/2023: She has been taking fluconazole and the fungal rash on her buttocks is improving. She still has skin irritation  that may be related to the drape adhesive. The wound itself is smaller and shallower with good granulation tissue on the surface. There are no longer any nooks and crannies in the tissue. 06/13/2023: The depth of the wound has come in by 50%. She has had a fair amount of drainage, likely attributable to the hypertrophic granulation tissue that has accumulated. 06/27/2023: The wound has contracted considerably. The hypertrophic granulation tissue has not reaccumulated. Drainage has decreased markedly. Her skin irritation has completely resolved. 07/09/2023: The depth of the wound has come in considerably and the orifice has contracted. There is a little slough on the surface and some hypertrophic granulation tissue at the orifice. 08/09/2023: The wound orifice has contracted further. It is shallower. There is some slough on the surface. No reaccumulation of the hypertrophic granulation tissue and no senescent skin buildup. Electronic Signature(s) Signed: 08/09/2023 3:44:17 PM By: Duanne Guess MD FACS Entered By: Duanne Guess on 08/09/2023 15:36:31 -------------------------------------------------------------------------------- Physical Exam Details Patient Name: Date of Service: [name] 08/09/2023 3:00 PM Medical Record Number: [id number] Patient Account Number: [id number] Date of Birth/Sex: Treating RN: [birth year] (72 y.o. F) Primary Care Provider: Saralyn Pilar Other Clinician: Referring Provider: Treating Provider/Extender: [name] Weeks in Treatment: 48 Constitutional . . . . no acute distress. Respiratory Normal work of breathing on room air.. Notes 08/09/2023: The wound orifice has contracted further. It is shallower. There is some slough on the surface. No reaccumulation of the hypertrophic granulation tissue and no senescent skin buildup. Electronic Signature(s) Signed: 08/09/2023 3:44:17 PM By: Duanne Guess MD FACS Entered By:  Duanne Guess on 08/09/2023 15:37:23 [name] ([id number]) [id number] Page 4 of 9 -------------------------------------------------------------------------------- Physician Orders Details Patient Name: Date of Service: BALINDA, RESURRECCION 08/09/2023 3:00 PM Medical Record Number: [id number] Patient Account Number: [id number] Date of Birth/Sex: Treating RN: [birth year] (72 y.o. Orville Govern Primary Care Provider: Saralyn Pilar Other Clinician: Referring Provider: Treating Provider/Extender: Park Liter in Treatment: 83 Verbal / Phone Orders: No Diagnosis Coding ICD-10 Coding Code Description L89.314 Pressure ulcer of right buttock, stage 4 I73.00 Raynaud's syndrome without gangrene I50.32 Chronic diastolic (congestive) heart failure N18.32 Chronic kidney disease, stage 3b R73.02 Impaired glucose tolerance (oral) E66.9 Obesity, unspecified Follow-up Appointments Return appointment in 1 month. - Dr. Lady Gary Anesthetic Wound #1 Right Gluteus (In clinic) Topical Lidocaine 4% applied to wound bed Bathing/ Shower/ Hygiene May shower and wash wound with soap and water. Negative Presssure Wound Therapy Wound #1 Right Gluteus Discontinue wound vac. Call the number on the machine for the company to pick up. Off-Loading Heel suspension boot to: - globoped right foot to ambulate Low air-loss mattress (Group 2) - Adapt Turn and reposition every 2 hours - avoid lying on back, stand at least every hour while out of bed, float heels off bed with pillows under calves while in bed Prevalon Boot - right foot esp. while in bed. Additional Orders / Instructions Follow Nutritious Diet - increase protein to 70-80 gms per day, hold ozempic Juven Shake 1-2 times daily. Home Health New wound care orders this week; continue Home Health for wound care. May utilize formulary equivalent dressing for wound treatment orders unless otherwise  specified. - pack wound with silver alginate cover with foam boarder dressing, dressing to be changed 3x wk Other Home Health Orders/Instructions: - [facility]  Wound #1 - Gluteus Wound Laterality: Right Cleanser: Wound Cleanser 3 x Per Week/30 Days Discharge Instructions: Cleanse the wound with wound cleanser prior to applying a clean dressing using gauze sponges, not tissue or cotton balls. Peri-Wound Care: Nystop Powder (Nystatin) 3 x Per Week/30 Days Discharge Instructions: Apply Nystop (Nystatin) Powder as needed to irritated skin Peri-Wound Care: Zinc Oxide Ointment 30g tube 3 x Per Week/30 Days Discharge Instructions: Apply Zinc Oxide to periwound with each dressing change Peri-Wound Care: hydrocortisone cream 1% 3 x Per Week/30 Days Discharge Instructions: may apply to rash for itching as needed Prim Dressing: Maxorb Extra Ag+ Alginate Dressing, 2x2 (in/in) 3 x Per Week/30 Days ary Discharge Instructions: Apply to wound bed as instructed Secondary Dressing: Zetuvit Plus Silicone Border Dressing 5x5 (in/in) 3 x Per Week/30 Days SHALAY, HEIDEL ([id number]) [phone].pdf Page 5 of 9 Discharge Instructions: Apply silicone border over primary dressing as directed. Patient Medications llergies: Sulfa (Sulfonamide Antibiotics), Cymbalta, Demerol, Iodinated Contrast Media, morphine, sulfasalazine, adhesive A Notifications Medication Indication Start End 08/09/2023 lidocaine DOSE topical 4 % cream - cream topical once daily Electronic Signature(s) Signed: 08/09/2023 3:45:54 PM By: Redmond Pulling RN, BSN Signed: 08/09/2023 3:48:09 PM By: Duanne Guess MD FACS Previous Signature: 08/09/2023 3:44:17 PM Version By: Duanne Guess MD FACS Entered By: Redmond Pulling on 08/09/2023 15:44:18 -------------------------------------------------------------------------------- Problem List Details Patient Name: Date of Service: [name] 08/09/2023  3:00 PM Medical Record Number: [id number] Patient Account Number: [id number] Date of Birth/Sex: Treating RN: April 19, 1951 (72 y.o. F) Primary Care Provider: Saralyn Pilar Other Clinician: Referring Provider: Treating Provider/Extender: [name] Weeks in Treatment: 45 Active Problems ICD-10 Encounter Code Description Active Date MDM Diagnosis L89.314 Pressure ulcer of right buttock, stage 4 09/07/2022 No Yes I73.00 Raynaud's syndrome without gangrene 09/07/2022 No Yes I50.32 Chronic diastolic (congestive) heart failure 09/07/2022 No Yes N18.32 Chronic kidney disease, stage 3b 09/07/2022 No Yes R73.02 Impaired glucose tolerance (oral) 09/07/2022 No Yes E66.9 Obesity, unspecified 09/07/2022 No Yes Inactive Problems ICD-10 Code Description Active Date Inactive Date L89.313 Pressure ulcer of right buttock, stage 3 09/07/2022 09/07/2022 Resolved Problems KIANNY, DEVENEY ([id number]) [phone].pdf Page 6 of 9 ICD-10 Code Description Active Date Resolved Date L89.614 Pressure ulcer of right heel, stage 4 09/28/2022 09/28/2022 Electronic Signature(s) Signed: 08/09/2023 3:44:17 PM By: Duanne Guess MD FACS Entered By: Duanne Guess on 08/09/2023 15:35:15 -------------------------------------------------------------------------------- Progress Note Details Patient Name: Date of Service: [name] 08/09/2023 3:00 PM Medical Record Number: [id number] Patient Account Number: [id number] Date of Birth/Sex: Treating RN: 08-Jun-1951 (72 y.o. F) Primary Care Provider: Saralyn Pilar Other Clinician: Referring Provider: Treating Provider/Extender: Park Liter in Treatment: 48 Subjective Chief Complaint Information obtained from Patient Patient is at the clinic for treatment of open pressure ulcers History of Present Illness (HPI) ADMISSION 09/07/2023 This is a 72 year old woman with a past medical history  notable for obesity, congestive heart failure, Raynaud's syndrome, CKD stage IIIb, osteoporosis, and rheumatoid arthritis. In November 2023, she suffered a fall that resulted in a femur fracture. She was hospitalized for about a week and underwent surgical repair of the fracture. She subsequently developed pressure ulcers on her right buttocks and ischium. She has been receiving home health services and they have been applying Medihoney. They have been reporting the wounds as stage II, but on evaluation, the large ulcer was probably unstageable at the time they were evaluating it but it is clearly a stage IV at this point. The smaller ulcer does have  fat layer exposure and therefore is a stage III. The patient is accompanied by her daughter. She says she has been sleeping on a regular bed but recently ordered an air mattress T opper, but does not have it yet. She is on Ozempic for weight loss and therefore has a poor appetite and struggles to get adequate protein intake. 09/15/2022: The large stage IV ulcer is substantially cleaner this week. The stage III ulcer is a little smaller. Both have slough accumulation. The culture that I took last week was polymicrobial. The Augmentin that I prescribed was adequate coverage for the species and she continues to take this. We have also ordered Keystone topical antibiotic compound, but this has not yet arrived. 09/21/2022: The stage IV ulcer has some necrotic muscle at the base but is otherwise fairly clean. The stage III ulcer is smaller again with light slough on the surface. She has her Keystone topical antibiotic compound with her today. 09/29/2022: There is still some necrotic muscle at the base of the stage IV ulcer that I was unable to get to last week. The stage III ulcer has healed. She unfortunately has developed a new pressure ulcer on her right heel. 10/06/2022: Still with some devitalized muscle at the base of the stage IV ulcer, but otherwise this wound  is looking quite clean. The pressure induced tissue injury on her heel is demarcating and much of the area appears to be epithelialized, but there is still 2 areas that remain questionable. 10/12/2022: The stage IV ulcer is very clean and ready for wound VAC, which will be delivered tomorrow according to the patient. The tissue injury on her heel is drying up and does not feel particularly boggy this week. 10/19/2022: The heel injury continues to improve. There is still some dry eschar present on the more plantar aspect of it. There is some slough accumulation on the surface of the stage IV pressure ulcer. The wound VAC was initiated by home health last week. 11/01/2022: The gluteal pressure ulcer is very clean without any necrotic tissue or slough. The depth has come in by over half a centimeter. The deep tissue injury on her heel continues to improve. There is still dry eschar that we are painting with Betadine as well as some fresh-looking viable tissue at the margin. 11/15/2022: The gluteal pressure ulcer is clean and contracting. The eschar on her heel ulcer is beginning to separate from the underlying tissue. 11/29/2022: The gluteal pressure ulcer is very clean. The depth has come in by about a centimeter. The eschar has completely separated off of her heel. There is some fibrinous exudate and slough on the heel. It has declared itself as a stage III at this point. 12/13/2022: The heel ulcer is smaller and much cleaner, but still has some non-viable tissue present. The orifice of the gluteal ulcer is contracting, but the depth remains the same. Fortunately, the sponge for the [facility] is being packed appropriately into the full depth of the wound. 12/27/2022: The heel ulcer is smaller, but I think the depth and degree of tissue injury has finally declared itself, with tendon being exposed at the posterior aspect of the ulcer. It is now stage IV. The gluteal ulcer continues to contract circumferentially, but the  depth is still unchanged. 01/10/2023: The heel ulcer has contracted further. There is still rubbery slough on the surface. There has been no further tissue breakdown; I think the last of the nonviable tissue was removed at her visit 2 weeks ago.  The gluteal ulcer has contracted further circumferentially, but there is still a tunnel that ankles off cranially that remains about the same depth. The tissues appear viable at both sites. 01/24/2023: Unfortunately, the home health nurse that was applying the wound VAC did not put any drape on her skin for the bridge and was applying sponge HARMANY, POND ([id number]) [phone].pdf Page 7 of 9 directly to the patient's skin. This is resulted in significant tissue breakdown and pain for the patient. Once we were notified of this, we discontinued the wound VAC and they have been packing the wound with saline moistened gauze. The heel has also deteriorated. Bone is now exposed. 01/30/2023: Her skin looks significantly better this week. It has recovered from the insult caused by applying the sponge directly to it. Her heel looks a little bit better as well. The culture that I took last week grew out group A strep, Proteus mirabilis, and Enterococcus, along with other skin flora. Although Levaquin was the recommended antibiotic, she is on amiodarone and therefore Levaquin is contraindicated. She has been taking Augmentin and is showing signs of improvement. 02/07/2023: The insult to her skin caused by direct application of wound VAC sponge has nearly completely healed. The gluteal ulcer cavity continues to contract circumferentially, but still probes quite deeply; I suspect the sponge for her VAC is not getting placed completely down into that tunnel. The heel is looking better today. There is more granulation filling in. I did not feel bone today. 02/14/2023: The gluteal ulcer cavity has circumferentially contracted even further, although the  depth remains the same. The heel has improved and there is good tissue overlying where the bone had been exposed. Both wounds have some slough on the surface. 02/21/2023: The gluteal ulcer cavity continues to contract and is about half a centimeter shallower this week. The surface is clean without any slough or other debris accumulation. The heel continues to fill in granulation tissue over where the bone had been exposed. There is some slough on the wound surface. 03/07/2023: The gluteal ulcer was measured slightly deeper today, but on palpation, it feels roughly the same. The cavity is certainly more contracted. She was complaining of more pain in the area and it appears that GranuFoam was applied directly to her skin and she has some irritation from this. The heel ulcer is filling in with excellent granulation tissue. It does not probe to bone any longer. Minimal slough accumulation. 03/21/2023: The gluteal ulcer is shallower and the cavity is tighter. She has some periwound skin breakdown that we have been trying to address by applying DuoDERM to the skin around the wound opening. The home health nurse also added nystatin powder to address some yeasty-looking areas. The heel ulcer is much shallower and smaller. Minimal slough and biofilm buildup. 04/04/2023: The gluteal ulcer continues to get shallower and narrower. The periwound skin looks much better this week. The heel ulcer has a band of epithelialized tissue separating the site into 2 openings. There is very little depth remaining and the wounds are quite clean with minimal slough on the surface. 04/18/2023: The heel ulcer is nearly completely closed. There is just 1 open very superficial area remaining underneath some eschar and slough. The gluteal ulcer still has the same depth at its deepest point, but everything is much narrower and constricted. The wound is clean without any slough. 05/02/2023: The heel ulcer is closed. She has had a recurrence  of the rash on her gluteus around the wound.  Her home health nurse left the wound VAC off after her visit on Monday because of this. The gluteal ulcer continues to contract and has a very clean surface. 05/16/2023: The gluteal fungal rash has worsened. The patient is complaining of significant itching and discomfort. Her home health nurse has been applying silver alginate to the periwound skin to try and protect it, but she really does not have a whole lot of drainage due to the excellent function of the wound VAC. The wound depth has come in a bit more and the orifice is narrowing further. 05/30/2023: She has been taking fluconazole and the fungal rash on her buttocks is improving. She still has skin irritation that may be related to the drape adhesive. The wound itself is smaller and shallower with good granulation tissue on the surface. There are no longer any nooks and crannies in the tissue. 06/13/2023: The depth of the wound has come in by 50%. She has had a fair amount of drainage, likely attributable to the hypertrophic granulation tissue that has accumulated. 06/27/2023: The wound has contracted considerably. The hypertrophic granulation tissue has not reaccumulated. Drainage has decreased markedly. Her skin irritation has completely resolved. 07/09/2023: The depth of the wound has come in considerably and the orifice has contracted. There is a little slough on the surface and some hypertrophic granulation tissue at the orifice. 08/09/2023: The wound orifice has contracted further. It is shallower. There is some slough on the surface. No reaccumulation of the hypertrophic granulation tissue and no senescent skin buildup. Objective Constitutional no acute distress. Vitals Time Taken: 2:58 AM, Height: 62 in, Weight: 207 lbs, BMI: 37.9, Temperature: 99 F, Pulse: 60 bpm, Respiratory Rate: 18 breaths/min, Blood Pressure: 116/62 mmHg. Respiratory Normal work of breathing on room air.. General  Notes: 08/09/2023: The wound orifice has contracted further. It is shallower. There is some slough on the surface. No reaccumulation of the hypertrophic granulation tissue and no senescent skin buildup. Integumentary (Hair, Skin) Wound #1 status is Open. Original cause of wound was Pressure Injury. The date acquired was: 08/11/2022. The wound has been in treatment 48 weeks. The wound is located on the Right Gluteus. The wound measures 0.5cm length x 1.5cm width x 1.4cm depth; 0.589cm^2 area and 0.825cm^3 volume. There is Fat Layer (Subcutaneous Tissue) exposed. There is no tunneling or undermining noted. There is a medium amount of serosanguineous drainage noted. The wound margin is well defined and not attached to the wound base. There is large (67-100%) red, hyper - granulation within the wound bed. There is a small (1-33%) amount of necrotic tissue within the wound bed including Adherent Slough. The periwound skin appearance had no abnormalities noted for moisture. The periwound skin appearance had no abnormalities noted for color. The periwound skin appearance did not exhibit: Callus, Crepitus, Excoriation, Induration, Rash, Scarring. Periwound temperature was noted as No Abnormality. Assessment RASHIMA, HOLSTINE ([id number]) [id number] Page 8 of 9 Active Problems ICD-10 Pressure ulcer of right buttock, stage 4 Raynaud's syndrome without gangrene Chronic diastolic (congestive) heart failure Chronic kidney disease, stage 3b Impaired glucose tolerance (oral) Obesity, unspecified Procedures Wound #1 Pre-procedure diagnosis of Wound #1 is a Pressure Ulcer located on the Right Gluteus . There was a Excisional Skin/Subcutaneous Tissue Debridement with a total area of 0.59 sq cm performed by Duanne Guess, MD. With the following instrument(s): Curette to remove Non-Viable tissue/material. Material removed includes Subcutaneous Tissue and Slough and after achieving  pain control using Lidocaine 4% T opical Solution. No specimens were  taken. A time out was conducted at 15:27, prior to the start of the procedure. A Minimum amount of bleeding was controlled with Pressure. The procedure was tolerated well. Post Debridement Measurements: 0.5cm length x 1.5cm width x 1.4cm depth; 0.825cm^3 volume. Post debridement Stage noted as Category/Stage IV. Character of Wound/Ulcer Post Debridement is improved. Post procedure Diagnosis Wound #1: Same as Pre-Procedure Plan Follow-up Appointments: Return appointment in 1 month. - Dr. Lady Gary Anesthetic: Wound #1 Right Gluteus: (In clinic) Topical Lidocaine 4% applied to wound bed Bathing/ Shower/ Hygiene: May shower and wash wound with soap and water. Negative Presssure Wound Therapy: Wound #1 Right Gluteus: Discontinue wound vac. Call the number on the machine for the company to pick up. Off-Loading: Heel suspension boot to: - globoped right foot to ambulate Low air-loss mattress (Group 2) - Adapt Turn and reposition every 2 hours - avoid lying on back, stand at least every hour while out of bed, float heels off bed with pillows under calves while in bed Prevalon Boot - right foot esp. while in bed. Additional Orders / Instructions: Follow Nutritious Diet - increase protein to 70-80 gms per day, hold ozempic Juven Shake 1-2 times daily. Home Health: New wound care orders this week; continue Home Health for wound care. May utilize formulary equivalent dressing for wound treatment orders unless otherwise specified. - pack wound with silver alginate cover with foam boarder dressing, dressing to be changed 3x wk Other Home Health Orders/Instructions: - [facility] The following medication(s) was prescribed: lidocaine topical 4 % cream cream topical once daily was prescribed at facility WOUND #1: - Gluteus Wound Laterality: Right Cleanser: Wound Cleanser 3 x Per Week/30 Days Discharge Instructions: Cleanse the wound with  wound cleanser prior to applying a clean dressing using gauze sponges, not tissue or cotton balls. Peri-Wound Care: Nystop Powder (Nystatin) 3 x Per Week/30 Days Discharge Instructions: Apply Nystop (Nystatin) Powder as needed to irritated skin Peri-Wound Care: Zinc Oxide Ointment 30g tube 3 x Per Week/30 Days Discharge Instructions: Apply Zinc Oxide to periwound with each dressing change Peri-Wound Care: hydrocortisone cream 1% 3 x Per Week/30 Days Discharge Instructions: may apply to rash for itching as needed Prim Dressing: Maxorb Extra Ag+ Alginate Dressing, 2x2 (in/in) 3 x Per Week/30 Days ary Discharge Instructions: Apply to wound bed as instructed Secondary Dressing: Zetuvit Plus Silicone Border Dressing 5x5 (in/in) 3 x Per Week/30 Days Discharge Instructions: Apply silicone border over primary dressing as directed. 08/09/2023: The wound orifice has contracted further. It is shallower. There is some slough on the surface. No reaccumulation of the hypertrophic granulation tissue and no senescent skin buildup. I used a curette to debride slough and subcutaneous tissue from the wound. I think we can switch to using silver alginate to pack the site. She needs to continue offloading by avoiding sitting for prolonged periods, shifting her weight frequently, and avoiding lying on that side of her body. She will follow-up in 1 month. Electronic Signature(s) Signed: 08/13/2023 10:01:25 AM By: Shawn Stall RN, BSN Signed: 08/13/2023 10:08:42 AM By: Duanne Guess MD FACS Previous Signature: 08/09/2023 3:44:17 PM Version By: Duanne Guess MD FACS GRAYCIN, MARCUCCI ([id number]) [id number] Page 9 of 9 Previous Signature: 08/09/2023 3:44:17 PM Version By: Duanne Guess MD FACS Entered By: Shawn Stall on 08/13/2023 09:59:29 -------------------------------------------------------------------------------- SuperBill Details Patient Name: Date of Service: CA DAZIYAH, HUY 08/09/2023 Medical Record Number: [id number] Patient Account Number: [id number] Date of Birth/Sex: Treating RN: [birth year] (72 y.o. F) Primary Care Provider: Saralyn Pilar Other  Clinician: Referring Provider: Treating Provider/Extender: Park Liter in Treatment: 48 Diagnosis Coding ICD-10 Codes Code Description L89.314 Pressure ulcer of right buttock, stage 4 I73.00 Raynaud's syndrome without gangrene I50.32 Chronic diastolic (congestive) heart failure N18.32 Chronic kidney disease, stage 3b R73.02 Impaired glucose tolerance (oral) E66.9 Obesity, unspecified Facility Procedures : CPT4 Code: [id number] Description: 11042 - DEB SUBQ TISSUE 20 SQ CM/< ICD-10 Diagnosis Description L89.314 Pressure ulcer of right buttock, stage 4 Modifier: Quantity: 1 Physician Procedures : CPT4 Code Description Modifier [id number] 99214 - WC PHYS LEVEL 4 - EST PT ICD-10 Diagnosis Description L89.314 Pressure ulcer of right buttock, stage 4 I50.32 Chronic diastolic (congestive) heart failure I73.00 Raynaud's syndrome without gangrene R73.02  Impaired glucose tolerance (oral) Quantity: 1 : [id number] 11042 - WC PHYS SUBQ TISS 20 SQ CM ICD-10 Diagnosis Description L89.314 Pressure ulcer of right buttock, stage 4 Quantity: 1 Electronic Signature(s) Signed: 08/09/2023 3:44:17 PM By: Duanne Guess MD FACS Entered By: Duanne Guess on 08/09/2023 15:43:23

## 2023-08-09 NOTE — Progress Notes (Signed)
[name], [name] ([id number]) [id number] Page 1 of 7 Visit Report for 08/09/2023 Arrival Information Details Patient Name: Date of Service: [name], [name] 08/09/2023 3:00 PM Medical Record Number: [id number] Patient Account Number: [id number] Date of Birth/Sex: Treating RN: 10-Aug-1951 (72 y.o. F) Primary Care [name]: [name] Other Clinician: Referring [name]: Treating [name]/Extender: [name] in Treatment: 48 Visit Information History Since Last Visit Added or deleted any medications: No Patient Arrived: Wheel Chair Any new allergies or adverse reactions: No Arrival Time: 14:57 Had a fall or experienced change in No Accompanied By: self activities of daily living that may affect Transfer Assistance: None risk of falls: Patient Identification Verified: Yes Signs or symptoms of abuse/neglect since last visito No Secondary Verification Process Completed: Yes Hospitalized since last visit: No Patient Requires Transmission-Based Precautions: No Implantable device outside of the clinic excluding No Patient Has Alerts: No cellular tissue based products placed in the center since last visit: Pain Present Now: No Electronic Signature(s) Signed: 08/09/2023 3:09:51 PM By: [name] Entered By: [name] on 08/09/2023 11:58:13 -------------------------------------------------------------------------------- Encounter Discharge Information Details Patient Name: Date of Service: [name]. 08/09/2023 3:00 PM Medical Record Number: [id number] Patient Account Number: [id number] Date of Birth/Sex: Treating RN: 04-01-1951 (72 y.o. [name] Primary Care [name]: [name] Other Clinician: Referring Iman Reinertsen: Treating Greely Atiyeh/Extender: [name] in Treatment: 44 Encounter Discharge Information Items Post Procedure Vitals Discharge Condition: Stable Temperature (F):  99 Ambulatory Status: Walker Pulse (bpm): 60 Discharge Destination: Home Respiratory Rate (breaths/min): 18 Transportation: Private Auto Blood Pressure (mmHg): 116/62 Accompanied By: self Schedule Follow-up Appointment: Yes Clinical Summary of Care: Patient Declined Electronic Signature(s) Signed: 08/09/2023 3:45:54 PM By: Redmond Pulling RN, BSN Entered By: Redmond Pulling on 08/09/2023 12:37:57 [name] ([id number]) [id number] Page 2 of 7 -------------------------------------------------------------------------------- Lower Extremity Assessment Details Patient Name: Date of Service: [name], [name] 08/09/2023 3:00 PM Medical Record Number: [id number] Patient Account Number: [id number] Date of Birth/Sex: Treating RN: 25-Jul-1951 (72 y.o. [name] Primary Care Brevan Luberto: [name] Other Clinician: Referring Reniyah Gootee: Treating Persia Lintner/Extender: [name] in Treatment: 48 Electronic Signature(s) Signed: 08/09/2023 3:45:54 PM By: Redmond Pulling RN, BSN Entered By: Redmond Pulling on 08/09/2023 12:23:53 -------------------------------------------------------------------------------- Multi Wound Chart Details Patient Name: Date of Service: [name]. 08/09/2023 3:00 PM Medical Record Number: [id number] Patient Account Number: [id number] Date of Birth/Sex: Treating RN: July 30, 1951 (72 y.o. F) Primary Care Tashiba Timoney: [name] Other Clinician: Referring Dallin Mccorkel: Treating Yida Hyams/Extender: [name] in Treatment: 48 Vital Signs Height(in): 62 Pulse(bpm): 60 Weight(lbs): 207 Blood Pressure(mmHg): 116/62 Body Mass Index(BMI): 37.9 Temperature(F): 99 Respiratory Rate(breaths/min): 18 [1:Photos:] [N/A:N/A] Right Gluteus N/A N/A Wound Location: Pressure Injury N/A N/A Wounding Event: Pressure Ulcer N/A N/A Primary Etiology: Chronic sinus problems/congestion, N/A  N/A Comorbid History: Sleep Apnea, Congestive Heart Failure, Raynauds, Rheumatoid Arthritis, Neuropathy 08/11/2022 N/A N/A Date Acquired: 48 N/A N/A Cooper of Treatment: Open N/A N/A Wound Status: No N/A N/A Wound Recurrence: 0.5x1.5x1.4 N/A N/A Measurements L x W x D (cm) 0.589 N/A N/A A (cm) : rea 0.825 N/A N/A Volume (cm) : 97.30% N/A N/A % Reduction in A rea: 99.40% N/A N/A % Reduction in Volume: Category/Stage IV N/A N/A Classification: Medium N/A N/A Exudate A mount: Serosanguineous N/A N/A Exudate Type: red, brown N/A N/A Exudate Color: Well defined, not attached N/A N/A Wound Margin: Large (67-100%) N/A N/A Granulation A mountMAZI, Cooper ([id number]) [id number] Page 3  of 7 Red, Hyper-granulation N/A N/A Granulation Quality: Small (1-33%) N/A N/A Necrotic Amount: Fat Layer (Subcutaneous Tissue): Yes N/A N/A Exposed Structures: Fascia: No Tendon: No Muscle: No Joint: No Bone: No Large (67-100%) N/A N/A Epithelialization: Debridement - Excisional N/A N/A Debridement: Pre-procedure Verification/Time Out 15:27 N/A N/A Taken: Lidocaine 4% Topical Solution N/A N/A Pain Control: Subcutaneous, Slough N/A N/A Tissue Debrided: Skin/Subcutaneous Tissue N/A N/A Level: 0.59 N/A N/A Debridement A (sq cm): rea Curette N/A N/A Instrument: Minimum N/A N/A Bleeding: Pressure N/A N/A Hemostasis A chieved: Procedure was tolerated well N/A N/A Debridement Treatment Response: 0.5x1.5x1.4 N/A N/A Post Debridement Measurements L x W x D (cm) 0.825 N/A N/A Post Debridement Volume: (cm) Category/Stage IV N/A N/A Post Debridement Stage: Excoriation: No N/A N/A Periwound Skin Texture: Induration: No Callus: No Crepitus: No Rash: No Scarring: No Maceration: No N/A N/A Periwound Skin Moisture: Dry/Scaly: No Atrophie Blanche: No N/A N/A Periwound Skin Color: Cyanosis: No Ecchymosis: No Erythema: No Hemosiderin  Staining: No Mottled: No Pallor: No Rubor: No No Abnormality N/A N/A Temperature: Debridement N/A N/A Procedures Performed: Treatment Notes Electronic Signature(s) Signed: 08/09/2023 3:44:17 PM By: Duanne Guess MD FACS Entered By: Duanne Guess on 08/09/2023 12:35:29 -------------------------------------------------------------------------------- Multi-Disciplinary Care Plan Details Patient Name: Date of Service: [name]. 08/09/2023 3:00 PM Medical Record Number: [id number] Patient Account Number: [id number] Date of Birth/Sex: Treating RN: 1951/04/27 (72 y.o. [name] Primary Care Deina Lipsey: [name] Other Clinician: Referring Beverely Suen: Treating Ova Gillentine/Extender: [name] in Treatment: 48 Multidisciplinary Care Plan reviewed with physician Active Inactive Wound/Skin Impairment Nursing Diagnoses: Impaired tissue integrity Knowledge deficit related to ulceration/compromised skin integrity Goals: Patient/caregiver will verbalize understanding of skin care regimen Date Initiated: 09/28/2022 Target Resolution Date: 09/13/2023 FALESHA, HENRICK ([id number]) [id number] Page 4 of 7 Goal Status: Active Ulcer/skin breakdown will have a volume reduction of 50% by week 8 Date Initiated: 09/28/2022 Date Inactivated: 11/01/2022 Target Resolution Date: 11/02/2022 Goal Status: Met Ulcer/skin breakdown will have a volume reduction of 80% by week 12 Date Initiated: 11/01/2022 Date Inactivated: 11/29/2022 Target Resolution Date: 11/29/2022 Unmet Reason: vac in place, still Goal Status: Unmet debriding heel Interventions: Assess patient/caregiver ability to obtain necessary supplies Assess patient/caregiver ability to perform ulcer/skin care regimen upon admission and as needed Assess ulceration(s) every visit Provide education on ulcer and skin care Treatment Activities: Skin care regimen initiated :  09/28/2022 Topical wound management initiated : 09/28/2022 Notes: Electronic Signature(s) Signed: 08/09/2023 3:45:54 PM By: Redmond Pulling RN, BSN Entered By: Redmond Pulling on 08/09/2023 12:27:02 -------------------------------------------------------------------------------- Pain Assessment Details Patient Name: Date of Service: [name]. 08/09/2023 3:00 PM Medical Record Number: [id number] Patient Account Number: [id number] Date of Birth/Sex: Treating RN: April 15, 1951 (72 y.o. F) Primary Care Destine Ambroise: [name] Other Clinician: Referring Ariely Riddell: Treating Bethanne Mule/Extender: [name] in Treatment: 48 Active Problems Location of Pain Severity and Description of Pain Patient Has Paino No Site Locations Pain Management and Medication Current Pain Management: Electronic Signature(s) Signed: 08/09/2023 3:09:51 PM By: [name] Entered By: [name] on 08/09/2023 11:58:41 [name] ([id number]) [id number] Page 5 of 7 -------------------------------------------------------------------------------- Patient/Caregiver Education Details Patient Name: Date of Service: [name], [name] 12/26/2024andnbsp3:00 PM Medical Record Number: [id number] Patient Account Number: [id number] Date of Birth/Gender: Treating RN: 01-20-1951 (72 y.o. [name] Primary Care Physician: [name] Other Clinician: Referring Physician: Treating Physician/Extender: [name] in Treatment: 17 Education Assessment Education Provided To: Patient Education Topics Provided Wound/Skin Impairment:  Methods: Explain/Verbal Responses: State content correctly Electronic Signature(s) Signed: 08/09/2023 3:45:54 PM By: Redmond Pulling RN, BSN Entered By: Redmond Pulling on 08/09/2023 12:27:28 -------------------------------------------------------------------------------- Wound Assessment  Details Patient Name: Date of Service: [name]. 08/09/2023 3:00 PM Medical Record Number: [id number] Patient Account Number: [id number] Date of Birth/Sex: Treating RN: Jul 21, 1951 (72 y.o. 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 Wound Open Wounding Event: Pressure Injury Status: Date Acquired: 08/11/2022 Comorbid Chronic sinus problems/congestion, Sleep Apnea, Congestive Heart Cooper Of Treatment: 48 History: Failure, Raynauds, Rheumatoid Arthritis, Neuropathy Clustered Wound: No Photos Wound Measurements Length: (cm) 0.5 Width: (cm) 1.5 [name], [name] ([id number]) Depth: (cm) 1 Area: (cm) Volume: (cm) % Reduction in Area: 97.3% % Reduction in Volume: 99.4% [id number] Page 6 of 7 .4 Epithelialization: Large (67-100%) 0.589 Tunneling: No 0.825 Undermining: No Wound Description Classification: Category/Stage IV Wound Margin: Well defined, not attached Exudate Amount: Medium Exudate Type: Serosanguineous Exudate Color: red, brown Foul Odor After Cleansing: No Slough/Fibrino Yes Wound Bed Granulation Amount: Large (67-100%) Exposed Structure Granulation Quality: Red, Hyper-granulation Fascia Exposed: No Necrotic Amount: Small (1-33%) Fat Layer (Subcutaneous Tissue) Exposed: Yes Necrotic Quality: Adherent Slough Tendon Exposed: No Muscle Exposed: No Joint Exposed: No Bone Exposed: No Periwound Skin Texture Texture Color No Abnormalities Noted: No No Abnormalities Noted: Yes Callus: No Temperature / Pain Crepitus: No Temperature: No Abnormality Excoriation: No Induration: No Rash: No Scarring: No Moisture No Abnormalities Noted: Yes Treatment Notes Wound #1 (Gluteus) Wound Laterality:  Right Cleanser Wound Cleanser Discharge Instruction: Cleanse the wound with wound cleanser prior to applying a clean dressing using gauze sponges, not tissue or cotton balls. Peri-Wound Care Nystop Powder (Nystatin) Discharge Instruction: Apply Nystop (Nystatin) Powder as needed to irritated skin Zinc Oxide Ointment 30g tube Discharge Instruction: Apply Zinc Oxide to periwound with each dressing change hydrocortisone cream 1% Discharge Instruction: may apply to rash for itching as needed Topical Primary Dressing Maxorb Extra Ag+ Alginate Dressing, 2x2 (in/in) Discharge Instruction: Apply to wound bed as instructed Secondary Dressing Zetuvit Plus Silicone Border Dressing 5x5 (in/in) Discharge Instruction: Apply silicone border over primary dressing as directed. Secured With Compression Wrap Compression Stockings Facilities manager) Signed: 08/09/2023 3:45:54 PM By: Redmond Pulling RN, BSN Previous Signature: 08/09/2023 3:09:51 PM Version By: [name] Entered By: Redmond Pulling on 08/09/2023 12:22:36 [name], [name] ([id number]) [id number] Page 7 of 7 -------------------------------------------------------------------------------- Vitals Details Patient Name: Date of Service: [name], [name] 08/09/2023 3:00 PM Medical Record Number: [id number] Patient Account Number: [id number] Date of Birth/Sex: Treating RN: [birth year] (72 y.o. F) Primary Care Helaina Stefano: [name] Other Clinician: Referring Jasline Buskirk: Treating Deuce Paternoster/Extender: [name] in Treatment: 48 Vital Signs Time Taken: 02:58 Temperature (F): 99 Height (in): 62 Pulse (bpm): 60 Weight (lbs): 207 Respiratory Rate (breaths/min): 18 Body Mass Index (BMI): 37.9 Blood Pressure (mmHg): 116/62 Reference Range: 80 - 120 mg / dl Electronic Signature(s) Signed: 08/09/2023 3:09:51 PM By: [name] Entered By: [name] on 08/09/2023 11:58:33

## 2023-08-17 ENCOUNTER — Other Ambulatory Visit: Payer: Self-pay | Admitting: Cardiology

## 2023-08-17 DIAGNOSIS — E7849 Other hyperlipidemia: Secondary | ICD-10-CM

## 2023-08-19 ENCOUNTER — Encounter: Payer: Self-pay | Admitting: Cardiology

## 2023-08-19 DIAGNOSIS — G4733 Obstructive sleep apnea (adult) (pediatric): Secondary | ICD-10-CM | POA: Diagnosis not present

## 2023-08-20 ENCOUNTER — Other Ambulatory Visit: Payer: Self-pay

## 2023-08-20 ENCOUNTER — Other Ambulatory Visit (HOSPITAL_COMMUNITY): Payer: Self-pay | Admitting: Cardiology

## 2023-08-20 DIAGNOSIS — E7849 Other hyperlipidemia: Secondary | ICD-10-CM

## 2023-08-20 DIAGNOSIS — I5030 Unspecified diastolic (congestive) heart failure: Secondary | ICD-10-CM

## 2023-08-20 MED ORDER — FENOFIBRATE 48 MG PO TABS: 48.0000 mg | ORAL_TABLET | Freq: Every day | ORAL | 0 refills | Status: DC

## 2023-08-21 NOTE — Telephone Encounter (Signed)
 Pt is requesting a refill on potassium. Would Dr. Mayford Knife like to refill this medication? Please address

## 2023-08-27 ENCOUNTER — Inpatient Hospital Stay: Admission: RE | Admit: 2023-08-27 | Payer: PPO | Source: Ambulatory Visit

## 2023-08-27 ENCOUNTER — Other Ambulatory Visit: Payer: Self-pay | Admitting: Family Medicine

## 2023-08-27 DIAGNOSIS — M810 Age-related osteoporosis without current pathological fracture: Secondary | ICD-10-CM

## 2023-08-27 NOTE — Therapy (Signed)
 OUTPATIENT PHYSICAL THERAPY LOWER EXTREMITY EVALUATION   Patient Name: AARINI SLEE MRN: [id number] DOB:09-01-1950, 73 y.o., female Today's Date: 08/28/2023  END OF SESSION:  PT End of Session - 08/28/23 1614     Visit Number 1    Number of Visits 8    Date for PT Re-Evaluation 10/26/23    Authorization Type HTA    PT Start Time 1530    PT Stop Time 1615    PT Time Calculation (min) 45 min    Activity Tolerance Patient tolerated treatment well    Behavior During Therapy [facility] for tasks assessed/performed             Past Medical History:  Diagnosis Date   Agatston coronary artery calcium  score greater than 400 05/2022   coronary Ca score 856   Back pain    Blood transfusion    1981   Chronic diastolic CHF (congestive heart failure) (HCC)    diastolic    COVID    DDD (degenerative disc disease), cervical    DDD (degenerative disc disease), lumbar    Fibromyalgia    GERD (gastroesophageal reflux disease)    Heart murmur    as a child   Hiatal hernia    sjorgens syndrome   High blood pressure    High cholesterol    IBS (irritable bowel syndrome)    OSA (obstructive sleep apnea)    Osteoarthritis    Peripheral autonomic neuropathy of unknown cause    Pre-diabetes    PVC (premature ventricular contraction)    Raynaud disease    Rheumatoid arthritis (HCC)    Sjogren's disease (HCC)    Urticaria    Vitamin D  deficiency    Past Surgical History:  Procedure Laterality Date   ABDOMINAL HYSTERECTOMY     BTL, BSO   ADENOIDECTOMY     BREAST EXCISIONAL BIOPSY Left    BREAST SURGERY     mass removal    CHOLECYSTECTOMY     dental implants     DIAGNOSTIC LAPAROSCOPY     x3   FEMUR FRACTURE SURGERY Right    KNEE ARTHROSCOPY     x2   MASS EXCISION Left 03/22/2017   Procedure: EXCISION OF LEFT BREAST MASS;  Surgeon: Belinda Cough, MD;  Location: [facility];  Service: General;  Laterality: Left;   MULTIPLE TOOTH EXTRACTIONS  06/2023   x5   ORIF FEMUR FRACTURE  Right 06/22/2022   Procedure: RIGHT OPEN REDUCTION INTERNAL FIXATION (ORIF) DISTAL FEMUR FRACTURE;  Surgeon: Celena Sharper, MD;  Location: [facility];  Service: Orthopedics;  Laterality: Right;   patotid cystectomy     RIGHT/LEFT HEART CATH AND CORONARY ANGIOGRAPHY N/A 07/25/2019   Procedure: RIGHT/LEFT HEART CATH AND CORONARY ANGIOGRAPHY;  Surgeon: Cherrie Toribio SAUNDERS, MD;  Location: [facility];  Service: Cardiovascular;  Laterality: N/A;   TENDON REPAIR  1980   left ankle and tibia   TONSILLECTOMY     TOTAL KNEE ARTHROPLASTY  06/21/2011   Procedure: TOTAL KNEE ARTHROPLASTY;  Surgeon: Dempsey LULLA Moan;  Location: [facility];  Service: Orthopedics;  Laterality: Right;   TUBAL LIGATION     Patient Active Problem List   Diagnosis Date Noted   Sacral ulcer (HCC) 07/30/2023   IDA (iron  deficiency anemia) 06/05/2023   Normocytic normochromic anemia 06/05/2023   Normocytic anemia 06/01/2023   Situational anxiety 10/15/2022   Pressure sore of left ischial area, stage IV (HCC) 09/10/2022   Pain in right leg 09/10/2022   Aftercare  for healing traumatic fracture of right femur 06/21/2022   DNR (do not resuscitate) 06/21/2022   Agatston coronary artery calcium  score greater than 400 05/25/2022   Laryngopharyngeal reflux (LPR) 10/10/2021   Chronic maxillary sinusitis 10/10/2021   Prediabetes 02/14/2021   Muscle spasm of back 08/01/2020   Microalbuminuria 08/01/2020   Chronic kidney disease, stage 3b (HCC) 08/01/2020   Post-menopausal 09/02/2019   Recurrent urticaria 05/20/2018   Vitamin D  deficiency 11/20/2017   Mixed hyperlipidemia 11/20/2017   Family history of coronary arteriosclerosis- strong fam h/o CAD and early CAD.  07/17/2017   Raynaud's disease without gangrene 03/12/2017   Eosinophilic esophagitis 10/12/2016   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   Autoimmune disease (HCC) 09/02/2016   Abnormality of gait 05/09/2016   Sjogren's syndrome (HCC) 02/29/2016   GERD  (gastroesophageal reflux disease) 01/19/2016   Glucose intolerance (impaired glucose tolerance) 01/19/2016   Chronic diastolic CHF (congestive heart failure) (HCC) 04/20/2015   Peripheral polyneuropathy 02/02/2014   Class 2 obesity due to excess calories with body mass index (BMI) of 36.0 to 36.9 in adult 11/17/2013   Heart murmur    OSA (obstructive sleep apnea)    Rheumatoid arthritis (HCC)    Hiatal hernia    Fibromyalgia    PVC's (premature ventricular contractions)    Unilateral primary osteoarthritis, left knee 06/22/2011    PCP: Wallace Joesph LABOR, PA  REFERRING PROVIDER: Wallace Joesph LABOR, PA  REFERRING DIAG: [phone] (ICD-10-CM) - Aftercare for healing traumatic fracture of right femur  THERAPY DIAG:  Physical deconditioning  Other abnormalities of gait and mobility  Rationale for Evaluation and Treatment: Rehabilitation  ONSET DATE: 06/22/22  SUBJECTIVE:   SUBJECTIVE STATEMENT: Sustained a R distal femur fracture requiring ORIF.  Has had both inpatient rehab as well as HHPT.  Her last formal PT session was March 2024.  She developed pressure ulcers on her R heel and buttock.  Her heel has healed and her buttock ulcer is healing.   PERTINENT HISTORY: Aftercare for healing traumatic fracture of right femur -     Methocarbamol ; Take 1 tablet (500 mg total) by mouth every 6 (six) hours as needed for muscle spasms.  Dispense: 120 tablet; Refill: 1 -     Ambulatory referral to Physical Therapy   Pain in right leg -     Methocarbamol ; Take 1 tablet (500 mg total) by mouth every 6 (six) hours as needed for muscle spasms.  Dispense: 120 tablet; Refill: 1 PAIN:  Are you having pain? Yes: NPRS scale: 7/10 Pain location: global Pain description: ache, sore Aggravating factors: undetermined Relieving factors: medications and indeterminate  PRECAUTIONS: None  RED FLAGS: None   WEIGHT BEARING RESTRICTIONS: No  FALLS:  Has patient fallen in last 6 months?  No  OCCUPATION: retired  PLOF: Independent  PATIENT GOALS: To increase my strength and mobility  NEXT MD VISIT: 3 months  OBJECTIVE:  Note: Objective measures were completed at Evaluation unless otherwise noted.  DIAGNOSTIC FINDINGS:  Narrative & Impression  CLINICAL DATA:  Fracture, postop.   EXAM: PORTABLE RIGHT KNEE - 1-2 VIEW; RIGHT FEMUR PORTABLE 2 VIEW   COMPARISON:  Preoperative imaging.   FINDINGS: Lateral plate and multi screw fixation of distal femur fracture. The fracture is in improved alignment with mild persistent displacement. There is no new periprosthetic lucency. Right knee arthroplasty is in place. Alignment remains congruent. Recent postsurgical change includes air and edema in the joint space and soft tissues.   IMPRESSION: ORIF distal femur fracture, in  improved alignment. No immediate postoperative complication.     Electronically Signed   By: Andrea Gasman M.D.   On: 06/22/2022 15:08    PATIENT SURVEYS:  LEFS 22/80  MUSCLE LENGTH: Hamstrings: WFL for gait and transfers (assessed seated) Thomas test: UTA  POSTURE: deferred  PALPATION: deferred  LOWER EXTREMITY ROM:  Passive ROM Right eval Left eval  Hip flexion    Hip extension    Hip abduction    Hip adduction    Hip internal rotation    Hip external rotation    Knee flexion    Knee extension    Ankle dorsiflexion    Ankle plantarflexion    Ankle inversion    Ankle eversion     (Blank rows = not tested)  LOWER EXTREMITY MMT:  MMT Right eval Left eval  Hip flexion    Hip extension    Hip abduction    Hip adduction    Hip internal rotation    Hip external rotation    Knee flexion    Knee extension    Ankle dorsiflexion    Ankle plantarflexion    Ankle inversion    Ankle eversion     (Blank rows = not tested)  LOWER EXTREMITY SPECIAL TESTS:  mCTSIB 30s at position 1,2; unable to maintain position 3,4 for >5 s  FUNCTIONAL TESTS:  30 seconds chair stand  test 5 reps with UE support  GAIT: Distance walked: 32ftx2  Assistive device utilized: Environmental Consultant - 4 wheeled Level of assistance: Complete Independence Comments: slow cadence                                                                                                                                TREATMENT DATE:  08/28/23 Eval and HEP   PATIENT EDUCATION:  Education details: Discussed eval findings, rehab rationale and POC and patient is in agreement  Person educated: Patient Education method: Explanation Education comprehension: verbalized understanding and needs further education  HOME EXERCISE PROGRAM: Access Code: [email or web address] URL: [email or web address] Date: 08/28/2023 Prepared by: Reyes Kohut  Exercises - Sit to Stand with Counter Support  - 2 x daily - 5 x weekly - 1 sets - 5 reps - Standing Heel Raise with Support  - 2 x daily - 5 x weekly - 2 sets - 10 reps  ASSESSMENT:  CLINICAL IMPRESSION: Patient is a 73 y.o. female who was seen today for physical therapy evaluation and treatment for functional deconditioning and LE weakness following R femur ORIF 06/2022 with complex post-op course. She ambulates with a rollator and requires UE support to stand from chair.  Current neuropathy affects balance and proprioception.  OBJECTIVE IMPAIRMENTS: Abnormal gait, decreased activity tolerance, decreased balance, decreased coordination, decreased endurance, decreased mobility, difficulty walking, decreased strength, and impaired perceived functional ability.   ACTIVITY LIMITATIONS: carrying, lifting, sitting, standing, squatting, stairs, transfers, and locomotion level  PERSONAL FACTORS: Fitness, Past/current experiences, Time since onset of injury/illness/exacerbation,  and 3+ comorbidities: RA, neuropathy and open wounds  are also affecting patient's functional outcome.   REHAB POTENTIAL: Good  CLINICAL DECISION MAKING: Stable/uncomplicated  EVALUATION  COMPLEXITY: Low   GOALS: Goals reviewed with patient? No  SHORT TERM GOALS: Target date: 09/28/2023   Patient to demonstrate independence in HEP  Baseline: [email or web address] Goal status: INITIAL  2.  Assess for baseline mobility Baseline: TBD Goal status: INITIAL   LONG TERM GOALS: Target date: 10/23/2023  Patient will acknowledge 4/10 pain at least once during episode of care   Baseline: 7/10 Goal status: INITIAL  2.  Patient will increase 30s chair stand reps from 5 to 8 with arms to demonstrate and improved functional ability with less pain/difficulty as well as reduce fall risk.  Baseline: 5 Goal status: INITIAL  3.  Patient will score at least 38% on LEFS to signify clinically meaningful improvement in functional abilities.   Baseline: 28% (22/80) Goal status: INITIAL  4.  Re-assess 2 MWT for progress. Baseline:  Goal status: INITIAL  5.  10s stance time on positions 3,4 on mCTSIB Baseline: <5s each position Goal status: INITIAL    PLAN:  PT FREQUENCY: 1x/week  PT DURATION: 8 weeks  PLANNED INTERVENTIONS: 97164- PT Re-evaluation, 97110-Therapeutic exercises, 97530- Therapeutic activity, 97112- Neuromuscular re-education, 97535- Self Care, [id number]- Manual therapy, [phone]- Gait training, [phone]- Aquatic Therapy, Balance training, Stair training, and Dry Needling  PLAN FOR NEXT SESSION: HEP review and update, manual techniques as appropriate, aerobic tasks, ROM and flexibility activities, strengthening and PREs, TPDN, gait and balance training as needed     Reyes CHRISTELLA Kohut, PT 08/28/2023, 4:28 PM

## 2023-08-28 ENCOUNTER — Other Ambulatory Visit: Payer: Self-pay

## 2023-08-28 ENCOUNTER — Telehealth: Payer: Self-pay | Admitting: Adult Health

## 2023-08-28 ENCOUNTER — Ambulatory Visit: Payer: PPO | Attending: Family Medicine

## 2023-08-28 DIAGNOSIS — S7291XD Unspecified fracture of right femur, subsequent encounter for closed fracture with routine healing: Secondary | ICD-10-CM | POA: Insufficient documentation

## 2023-08-28 DIAGNOSIS — R5381 Other malaise: Secondary | ICD-10-CM | POA: Insufficient documentation

## 2023-08-28 DIAGNOSIS — R2689 Other abnormalities of gait and mobility: Secondary | ICD-10-CM | POA: Insufficient documentation

## 2023-08-28 NOTE — Telephone Encounter (Signed)
 Pt r/s appt due to dentist appt. Wait listed

## 2023-08-30 ENCOUNTER — Ambulatory Visit: Payer: PPO | Admitting: Adult Health

## 2023-09-03 DIAGNOSIS — L89313 Pressure ulcer of right buttock, stage 3: Secondary | ICD-10-CM | POA: Diagnosis not present

## 2023-09-03 DIAGNOSIS — I5032 Chronic diastolic (congestive) heart failure: Secondary | ICD-10-CM | POA: Diagnosis not present

## 2023-09-03 DIAGNOSIS — N1832 Chronic kidney disease, stage 3b: Secondary | ICD-10-CM | POA: Diagnosis not present

## 2023-09-03 DIAGNOSIS — R7302 Impaired glucose tolerance (oral): Secondary | ICD-10-CM | POA: Diagnosis not present

## 2023-09-03 DIAGNOSIS — L89314 Pressure ulcer of right buttock, stage 4: Secondary | ICD-10-CM | POA: Diagnosis not present

## 2023-09-05 ENCOUNTER — Encounter (HOSPITAL_BASED_OUTPATIENT_CLINIC_OR_DEPARTMENT_OTHER): Payer: PPO | Admitting: General Surgery

## 2023-09-06 ENCOUNTER — Ambulatory Visit: Payer: PPO | Admitting: Physical Therapy

## 2023-09-06 ENCOUNTER — Other Ambulatory Visit (INDEPENDENT_AMBULATORY_CARE_PROVIDER_SITE_OTHER): Payer: Self-pay | Admitting: Family Medicine

## 2023-09-06 DIAGNOSIS — I5030 Unspecified diastolic (congestive) heart failure: Secondary | ICD-10-CM

## 2023-09-06 NOTE — Telephone Encounter (Signed)
Call to patient to explain that Dr. Mayford Knife would like her potassium to be managed by her nephrologist. Patient verbalizes understanding and agrees to plan.

## 2023-09-11 ENCOUNTER — Encounter (HOSPITAL_BASED_OUTPATIENT_CLINIC_OR_DEPARTMENT_OTHER): Payer: PPO | Attending: General Surgery | Admitting: General Surgery

## 2023-09-11 DIAGNOSIS — L89314 Pressure ulcer of right buttock, stage 4: Secondary | ICD-10-CM | POA: Diagnosis not present

## 2023-09-11 DIAGNOSIS — I5032 Chronic diastolic (congestive) heart failure: Secondary | ICD-10-CM | POA: Insufficient documentation

## 2023-09-11 DIAGNOSIS — N1832 Chronic kidney disease, stage 3b: Secondary | ICD-10-CM | POA: Insufficient documentation

## 2023-09-11 DIAGNOSIS — R7302 Impaired glucose tolerance (oral): Secondary | ICD-10-CM | POA: Diagnosis not present

## 2023-09-11 DIAGNOSIS — Z7985 Long-term (current) use of injectable non-insulin antidiabetic drugs: Secondary | ICD-10-CM | POA: Diagnosis not present

## 2023-09-11 DIAGNOSIS — E669 Obesity, unspecified: Secondary | ICD-10-CM | POA: Insufficient documentation

## 2023-09-11 DIAGNOSIS — I7300 Raynaud's syndrome without gangrene: Secondary | ICD-10-CM | POA: Diagnosis not present

## 2023-09-12 ENCOUNTER — Ambulatory Visit: Payer: PPO

## 2023-09-12 NOTE — Therapy (Incomplete)
OUTPATIENT PHYSICAL THERAPY TREATMENT NOTE  Patient Name: [name] MRN: [id number] DOB:August 21, 1950, 73 y.o., female Today's Date: 09/12/2023  END OF SESSION:    Past Medical History:  Diagnosis Date   Agatston coronary artery calcium score greater than 400 05/2022   coronary Ca score 856   Back pain    Blood transfusion    1981   Chronic diastolic CHF (congestive heart failure) (HCC)    diastolic    COVID    DDD (degenerative disc disease), cervical    DDD (degenerative disc disease), lumbar    Fibromyalgia    GERD (gastroesophageal reflux disease)    Heart murmur    as a child   Hiatal hernia    sjorgens syndrome   High blood pressure    High cholesterol    IBS (irritable bowel syndrome)    OSA (obstructive sleep apnea)    Osteoarthritis    Peripheral autonomic neuropathy of unknown cause    Pre-diabetes    PVC (premature ventricular contraction)    Raynaud disease    Rheumatoid arthritis (HCC)    Sjogren's disease (HCC)    Urticaria    Vitamin D deficiency    Past Surgical History:  Procedure Laterality Date   ABDOMINAL HYSTERECTOMY     BTL, BSO   ADENOIDECTOMY     BREAST EXCISIONAL BIOPSY Left    BREAST SURGERY     mass removal    CHOLECYSTECTOMY     dental implants     DIAGNOSTIC LAPAROSCOPY     x3   FEMUR FRACTURE SURGERY Right    KNEE ARTHROSCOPY     x2   MASS EXCISION Left 03/22/2017   Procedure: EXCISION OF LEFT BREAST MASS;  Surgeon: Manus Rudd, MD;  Location: [facility];  Service: General;  Laterality: Left;   MULTIPLE TOOTH EXTRACTIONS  06/2023   x5   ORIF FEMUR FRACTURE Right 06/22/2022   Procedure: RIGHT OPEN REDUCTION INTERNAL FIXATION (ORIF) DISTAL FEMUR FRACTURE;  Surgeon: Myrene Galas, MD;  Location: [facility];  Service: Orthopedics;  Laterality: Right;   patotid cystectomy     RIGHT/LEFT HEART CATH AND CORONARY ANGIOGRAPHY N/A 07/25/2019   Procedure: RIGHT/LEFT HEART CATH AND CORONARY ANGIOGRAPHY;  Surgeon: Dolores Patty, MD;   Location: [facility];  Service: Cardiovascular;  Laterality: N/A;   TENDON REPAIR  1980   left ankle and tibia   TONSILLECTOMY     TOTAL KNEE ARTHROPLASTY  06/21/2011   Procedure: TOTAL KNEE ARTHROPLASTY;  Surgeon: Loanne Drilling;  Location: [facility];  Service: Orthopedics;  Laterality: Right;   TUBAL LIGATION     Patient Active Problem List   Diagnosis Date Noted   Sacral ulcer (HCC) 07/30/2023   IDA (iron deficiency anemia) 06/05/2023   Normocytic normochromic anemia 06/05/2023   Normocytic anemia 06/01/2023   Situational anxiety 10/15/2022   Pressure sore of left ischial area, stage IV (HCC) 09/10/2022   Pain in right leg 09/10/2022   Aftercare for healing traumatic fracture of right femur 06/21/2022   DNR (do not resuscitate) 06/21/2022   Agatston coronary artery calcium score greater than 400 05/25/2022   Laryngopharyngeal reflux (LPR) 10/10/2021   Chronic maxillary sinusitis 10/10/2021   Prediabetes 02/14/2021   Muscle spasm of back 08/01/2020   Microalbuminuria 08/01/2020   Chronic kidney disease, stage 3b (HCC) 08/01/2020   Post-menopausal 09/02/2019   Recurrent urticaria 05/20/2018   Vitamin D deficiency 11/20/2017   Mixed hyperlipidemia 11/20/2017   Family history of coronary arteriosclerosis-  strong fam h/o CAD and early CAD.  07/17/2017   Raynaud's disease without gangrene 03/12/2017   Eosinophilic esophagitis 10/12/2016   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   Autoimmune disease (HCC) 09/02/2016   Abnormality of gait 05/09/2016   Sjogren's syndrome (HCC) 02/29/2016   GERD (gastroesophageal reflux disease) 01/19/2016   Glucose intolerance (impaired glucose tolerance) 01/19/2016   Chronic diastolic CHF (congestive heart failure) (HCC) 04/20/2015   Peripheral polyneuropathy 02/02/2014   Class 2 obesity due to excess calories with body mass index (BMI) of 36.0 to 36.9 in adult 11/17/2013   Heart murmur    OSA (obstructive sleep apnea)    Rheumatoid  arthritis (HCC)    Hiatal hernia    Fibromyalgia    PVC's (premature ventricular contractions)    Unilateral primary osteoarthritis, left knee 06/22/2011    PCP: Melida Quitter, PA  REFERRING PROVIDER: Melida Quitter, PA  REFERRING DIAG: [phone] (ICD-10-CM) - Aftercare for healing traumatic fracture of right femur  THERAPY DIAG:  No diagnosis found.  Rationale for Evaluation and Treatment: Rehabilitation  ONSET DATE: 06/22/22  SUBJECTIVE:   SUBJECTIVE STATEMENT: ***  Sustained a R distal femur fracture requiring ORIF.  Has had both inpatient rehab as well as HHPT.  Her last formal PT session was March 2024.  She developed pressure ulcers on her R heel and buttock.  Her heel has healed and her buttock ulcer is healing.   PERTINENT HISTORY: Aftercare for healing traumatic fracture of right femur -     Methocarbamol; Take 1 tablet (500 mg total) by mouth every 6 (six) hours as needed for muscle spasms.  Dispense: 120 tablet; Refill: 1 -     Ambulatory referral to Physical Therapy   Pain in right leg -     Methocarbamol; Take 1 tablet (500 mg total) by mouth every 6 (six) hours as needed for muscle spasms.  Dispense: 120 tablet; Refill: 1 PAIN:  Are you having pain? Yes: NPRS scale: 7/10 Pain location: global Pain description: ache, sore Aggravating factors: undetermined Relieving factors: medications and indeterminate  PRECAUTIONS: None  RED FLAGS: None   WEIGHT BEARING RESTRICTIONS: No  FALLS:  Has patient fallen in last 6 months? No  OCCUPATION: retired  PLOF: Independent  PATIENT GOALS: To increase my strength and mobility  NEXT MD VISIT: 3 months  OBJECTIVE:  Note: Objective measures were completed at Evaluation unless otherwise noted.  DIAGNOSTIC FINDINGS:  Narrative & Impression  CLINICAL DATA:  Fracture, postop.   EXAM: PORTABLE RIGHT KNEE - 1-2 VIEW; RIGHT FEMUR PORTABLE 2 VIEW   COMPARISON:  Preoperative imaging.   FINDINGS: Lateral  plate and multi screw fixation of distal femur fracture. The fracture is in improved alignment with mild persistent displacement. There is no new periprosthetic lucency. Right knee arthroplasty is in place. Alignment remains congruent. Recent postsurgical change includes air and edema in the joint space and soft tissues.   IMPRESSION: ORIF distal femur fracture, in improved alignment. No immediate postoperative complication.     Electronically Signed   By: Narda Rutherford M.D.   On: 06/22/2022 15:08    PATIENT SURVEYS:  LEFS 22/80  MUSCLE LENGTH: Hamstrings: WFL for gait and transfers (assessed seated) Maisie Fus test: UTA  POSTURE: deferred  PALPATION: deferred  LOWER EXTREMITY ROM:  Passive ROM Right eval Left eval  Hip flexion    Hip extension    Hip abduction    Hip adduction    Hip internal rotation    Hip external rotation  Knee flexion    Knee extension    Ankle dorsiflexion    Ankle plantarflexion    Ankle inversion    Ankle eversion     (Blank rows = not tested)  LOWER EXTREMITY MMT:  MMT Right eval Left eval  Hip flexion    Hip extension    Hip abduction    Hip adduction    Hip internal rotation    Hip external rotation    Knee flexion    Knee extension    Ankle dorsiflexion    Ankle plantarflexion    Ankle inversion    Ankle eversion     (Blank rows = not tested)  LOWER EXTREMITY SPECIAL TESTS:  mCTSIB 30s at position 1,2; unable to maintain position 3,4 for >5 s  FUNCTIONAL TESTS:  30 seconds chair stand test 5 reps with UE support  GAIT: Distance walked: 68ftx2  Assistive device utilized: Environmental consultant - 4 wheeled Level of assistance: Complete Independence Comments: slow cadence                                                                                                                                TREATMENT DATE:  [facility] Adult PT Treatment:                                                DATE: 09/12/23 Therapeutic  Exercise: Nustep level 3 x 5 mins Seated marching LAQ Seated hip adduction ball squeeze Bridges SLR Sidelying clamshell STS Therapeutic Activity: *** ft   08/28/23 Eval and HEP   PATIENT EDUCATION:  Education details: Discussed eval findings, rehab rationale and POC and patient is in agreement  Person educated: Patient Education method: Explanation Education comprehension: verbalized understanding and needs further education  HOME EXERCISE PROGRAM: Access Code: [email or web address] URL: [email or web address] Date: 08/28/2023 Prepared by: Gustavus Bryant  Exercises - Sit to Stand with Counter Support  - 2 x daily - 5 x weekly - 1 sets - 5 reps - Standing Heel Raise with Support  - 2 x daily - 5 x weekly - 2 sets - 10 reps  ASSESSMENT:  CLINICAL IMPRESSION: ***  Patient is a 73 y.o. female who was seen today for physical therapy evaluation and treatment for functional deconditioning and LE weakness following R femur ORIF 06/2022 with complex post-op course. She ambulates with a rollator and requires UE support to stand from chair.  Current neuropathy affects balance and proprioception.  OBJECTIVE IMPAIRMENTS: Abnormal gait, decreased activity tolerance, decreased balance, decreased coordination, decreased endurance, decreased mobility, difficulty walking, decreased strength, and impaired perceived functional ability.   ACTIVITY LIMITATIONS: carrying, lifting, sitting, standing, squatting, stairs, transfers, and locomotion level  PERSONAL FACTORS: Fitness, Past/current experiences, Time since onset of injury/illness/exacerbation, and 3+ comorbidities: RA, neuropathy and open wounds  are also affecting patient's  functional outcome.   REHAB POTENTIAL: Good  CLINICAL DECISION MAKING: Stable/uncomplicated  EVALUATION COMPLEXITY: Low   GOALS: Goals reviewed with patient? No  SHORT TERM GOALS: Target date: 09/28/2023   Patient to demonstrate independence in HEP   Baseline: [id number] Goal status: INITIAL  2.  Assess for baseline mobility Baseline: TBD Goal status: INITIAL   LONG TERM GOALS: Target date: 10/23/2023  Patient will acknowledge 4/10 pain at least once during episode of care   Baseline: 7/10 Goal status: INITIAL  2.  Patient will increase 30s chair stand reps from 5 to 8 with arms to demonstrate and improved functional ability with less pain/difficulty as well as reduce fall risk.  Baseline: 5 Goal status: INITIAL  3.  Patient will score at least 38% on LEFS to signify clinically meaningful improvement in functional abilities.   Baseline: 28% (22/80) Goal status: INITIAL  4.  Re-assess 2 MWT for progress. Baseline:  Goal status: INITIAL  5.  10s stance time on positions 3,4 on mCTSIB Baseline: <5s each position Goal status: INITIAL    PLAN:  PT FREQUENCY: 1x/week  PT DURATION: 8 weeks  PLANNED INTERVENTIONS: 97164- PT Re-evaluation, 97110-Therapeutic exercises, 97530- Therapeutic activity, 97112- Neuromuscular re-education, 97535- Self Care, 62130- Manual therapy, [phone]- Gait training, [phone]- Aquatic Therapy, Balance training, Stair training, and Dry Needling  PLAN FOR NEXT SESSION: HEP review and update, manual techniques as appropriate, aerobic tasks, ROM and flexibility activities, strengthening and PREs, TPDN, gait and balance training as needed     Berta Minor, PTA 09/12/2023, 10:34 AM

## 2023-09-18 ENCOUNTER — Encounter: Payer: Self-pay | Admitting: Physical Therapy

## 2023-09-18 ENCOUNTER — Ambulatory Visit: Payer: PPO | Attending: Family Medicine | Admitting: Physical Therapy

## 2023-09-18 DIAGNOSIS — R2689 Other abnormalities of gait and mobility: Secondary | ICD-10-CM | POA: Insufficient documentation

## 2023-09-18 DIAGNOSIS — R5381 Other malaise: Secondary | ICD-10-CM | POA: Diagnosis not present

## 2023-09-18 NOTE — Therapy (Signed)
 OUTPATIENT PHYSICAL THERAPY TREATMENT NOTE  Patient Name: [name] MRN: [id number] DOB:November 03, 1950, 73 y.o., female Today's Date: 09/18/2023  END OF SESSION:  PT End of Session - 09/18/23 1438     Visit Number 2    Number of Visits 8    Date for PT Re-Evaluation 10/26/23    PT Start Time 1445    PT Stop Time 1525    PT Time Calculation (min) 40 min    Activity Tolerance Patient tolerated treatment well    Behavior During Therapy [facility] for tasks assessed/performed              Past Medical History:  Diagnosis Date   Agatston coronary artery calcium  score greater than 400 05/2022   coronary Ca score 856   Back pain    Blood transfusion    1981   Chronic diastolic CHF (congestive heart failure) (HCC)    diastolic    COVID    DDD (degenerative disc disease), cervical    DDD (degenerative disc disease), lumbar    Fibromyalgia    GERD (gastroesophageal reflux disease)    Heart murmur    as a child   Hiatal hernia    sjorgens syndrome   High blood pressure    High cholesterol    IBS (irritable bowel syndrome)    OSA (obstructive sleep apnea)    Osteoarthritis    Peripheral autonomic neuropathy of unknown cause    Pre-diabetes    PVC (premature ventricular contraction)    Raynaud disease    Rheumatoid arthritis (HCC)    Sjogren's disease (HCC)    Urticaria    Vitamin D  deficiency    Past Surgical History:  Procedure Laterality Date   ABDOMINAL HYSTERECTOMY     BTL, BSO   ADENOIDECTOMY     BREAST EXCISIONAL BIOPSY Left    BREAST SURGERY     mass removal    CHOLECYSTECTOMY     dental implants     DIAGNOSTIC LAPAROSCOPY     x3   FEMUR FRACTURE SURGERY Right    KNEE ARTHROSCOPY     x2   MASS EXCISION Left 03/22/2017   Procedure: EXCISION OF LEFT BREAST MASS;  Surgeon: Belinda Cough, MD;  Location: [facility];  Service: General;  Laterality: Left;   MULTIPLE TOOTH EXTRACTIONS  06/2023   x5   ORIF FEMUR FRACTURE Right 06/22/2022   Procedure: RIGHT OPEN  REDUCTION INTERNAL FIXATION (ORIF) DISTAL FEMUR FRACTURE;  Surgeon: Celena Sharper, MD;  Location: [facility] OR;  Service: Orthopedics;  Laterality: Right;   patotid cystectomy     RIGHT/LEFT HEART CATH AND CORONARY ANGIOGRAPHY N/A 07/25/2019   Procedure: RIGHT/LEFT HEART CATH AND CORONARY ANGIOGRAPHY;  Surgeon: Cherrie Toribio SAUNDERS, MD;  Location: [facility];  Service: Cardiovascular;  Laterality: N/A;   TENDON REPAIR  1980   left ankle and tibia   TONSILLECTOMY     TOTAL KNEE ARTHROPLASTY  06/21/2011   Procedure: TOTAL KNEE ARTHROPLASTY;  Surgeon: Dempsey LULLA Moan;  Location: [facility];  Service: Orthopedics;  Laterality: Right;   TUBAL LIGATION     Patient Active Problem List   Diagnosis Date Noted   Sacral ulcer (HCC) 07/30/2023   IDA (iron  deficiency anemia) 06/05/2023   Normocytic normochromic anemia 06/05/2023   Normocytic anemia 06/01/2023   Situational anxiety 10/15/2022   Pressure sore of left ischial area, stage IV (HCC) 09/10/2022   Pain in right leg 09/10/2022   Aftercare for healing traumatic fracture of right femur  06/21/2022   DNR (do not resuscitate) 06/21/2022   Agatston coronary artery calcium  score greater than 400 05/25/2022   Laryngopharyngeal reflux (LPR) 10/10/2021   Chronic maxillary sinusitis 10/10/2021   Prediabetes 02/14/2021   Muscle spasm of back 08/01/2020   Microalbuminuria 08/01/2020   Chronic kidney disease, stage 3b (HCC) 08/01/2020   Post-menopausal 09/02/2019   Recurrent urticaria 05/20/2018   Vitamin D  deficiency 11/20/2017   Mixed hyperlipidemia 11/20/2017   Family history of coronary arteriosclerosis- strong fam h/o CAD and early CAD.  07/17/2017   Raynaud's disease without gangrene 03/12/2017   Eosinophilic esophagitis 10/12/2016   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   Autoimmune disease (HCC) 09/02/2016   Abnormality of gait 05/09/2016   Sjogren's syndrome (HCC) 02/29/2016   GERD (gastroesophageal reflux disease) 01/19/2016    Glucose intolerance (impaired glucose tolerance) 01/19/2016   Chronic diastolic CHF (congestive heart failure) (HCC) 04/20/2015   Peripheral polyneuropathy 02/02/2014   Class 2 obesity due to excess calories with body mass index (BMI) of 36.0 to 36.9 in adult 11/17/2013   Heart murmur    OSA (obstructive sleep apnea)    Rheumatoid arthritis (HCC)    Hiatal hernia    Fibromyalgia    PVC's (premature ventricular contractions)    Unilateral primary osteoarthritis, left knee 06/22/2011    PCP: Wallace Joesph LABOR, PA  REFERRING PROVIDER: Wallace Joesph LABOR, PA  REFERRING DIAG: [phone] (ICD-10-CM) - Aftercare for healing traumatic fracture of right femur  THERAPY DIAG:  Physical deconditioning  Other abnormalities of gait and mobility  Rationale for Evaluation and Treatment: Rehabilitation  ONSET DATE: 06/22/22  SUBJECTIVE:   SUBJECTIVE STATEMENT: Pt has maintained compliance with current HEP, says today isnt a good day overall because of knee pain, but is still feeling good enough for PT.  Sustained a R distal femur fracture requiring ORIF.  Has had both inpatient rehab as well as HHPT.  Her last formal PT session was March 2024.  She developed pressure ulcers on her R heel and buttock.  Her heel has healed and her buttock ulcer is healing.   PERTINENT HISTORY: Aftercare for healing traumatic fracture of right femur -     Methocarbamol ; Take 1 tablet (500 mg total) by mouth every 6 (six) hours as needed for muscle spasms.  Dispense: 120 tablet; Refill: 1 -     Ambulatory referral to Physical Therapy   Pain in right leg -     Methocarbamol ; Take 1 tablet (500 mg total) by mouth every 6 (six) hours as needed for muscle spasms.  Dispense: 120 tablet; Refill: 1 PAIN:  Are you having pain? Yes: NPRS scale: 7/10 Pain location: global Pain description: ache, sore Aggravating factors: undetermined Relieving factors: medications and indeterminate  PRECAUTIONS: None  RED  FLAGS: None   WEIGHT BEARING RESTRICTIONS: No  FALLS:  Has patient fallen in last 6 months? No  OCCUPATION: retired  PLOF: Independent  PATIENT GOALS: To increase my strength and mobility  NEXT MD VISIT: 3 months  OBJECTIVE:  Note: Objective measures were completed at Evaluation unless otherwise noted.  DIAGNOSTIC FINDINGS:  Narrative & Impression  CLINICAL DATA:  Fracture, postop.   EXAM: PORTABLE RIGHT KNEE - 1-2 VIEW; RIGHT FEMUR PORTABLE 2 VIEW   COMPARISON:  Preoperative imaging.   FINDINGS: Lateral plate and multi screw fixation of distal femur fracture. The fracture is in improved alignment with mild persistent displacement. There is no new periprosthetic lucency. Right knee arthroplasty is in place. Alignment remains congruent. Recent postsurgical  change includes air and edema in the joint space and soft tissues.   IMPRESSION: ORIF distal femur fracture, in improved alignment. No immediate postoperative complication.     Electronically Signed   By: Andrea Gasman M.D.   On: 06/22/2022 15:08    PATIENT SURVEYS:  LEFS 22/80  MUSCLE LENGTH: Hamstrings: WFL for gait and transfers (assessed seated) Thomas test: UTA  POSTURE: deferred  PALPATION: deferred  LOWER EXTREMITY ROM:  Passive ROM Right eval Left eval  Hip flexion    Hip extension    Hip abduction    Hip adduction    Hip internal rotation    Hip external rotation    Knee flexion    Knee extension    Ankle dorsiflexion    Ankle plantarflexion    Ankle inversion    Ankle eversion     (Blank rows = not tested)  LOWER EXTREMITY MMT:  MMT Right eval Left eval  Hip flexion    Hip extension    Hip abduction    Hip adduction    Hip internal rotation    Hip external rotation    Knee flexion    Knee extension    Ankle dorsiflexion    Ankle plantarflexion    Ankle inversion    Ankle eversion     (Blank rows = not tested)  LOWER EXTREMITY SPECIAL TESTS:  mCTSIB 30s at  position 1,2; unable to maintain position 3,4 for >5 s  FUNCTIONAL TESTS:  30 seconds chair stand test 5 reps with UE support  GAIT: Distance walked: 3ftx2  Assistive device utilized: Environmental Consultant - 4 wheeled Level of assistance: Complete Independence Comments: slow cadence                                                                                                                                TREATMENT DATE:  [facility] Adult PT Treatment:                                                DATE: 09/18/2023  Therapeutic Exercise: Nustep level 3 x 5 mins Banded seated clamshell 2x12, 3s hold, red Therapeutic Activity: Standing marches with band 2x1', yellow  Standing reciprocal Hip extension, BW, 2x15 2s hold ea. LAQ w/lift x10, 3s hold STS 2x8 Bridges (d/c d/t intolerance for supine position and Knee ROM )   08/28/23 Eval and HEP   PATIENT EDUCATION:  Education details: Discussed eval findings, rehab rationale and POC and patient is in agreement  Person educated: Patient Education method: Explanation Education comprehension: verbalized understanding and needs further education  HOME EXERCISE PROGRAM: Access Code: [email or web address] URL: [email or web address] Date: 08/28/2023 Prepared by: Reyes Kohut  Exercises - Sit to Stand with Counter Support  - 2 x daily - 5 x weekly - 1 sets - 5 reps -  Standing Heel Raise with Support  - 2 x daily - 5 x weekly - 2 sets - 10 reps  ASSESSMENT:  CLINICAL IMPRESSION: Pt attended physical therapy session for continuation of treatment regarding deconditioning s/p R femur ORIF. Today's treatment focused on improvement of building stability in available ROM as well as foundational strengthening to improve gait quality. Pt showed  great tolerance to treatment and demonstrated improvement with transfer quality, standing tolerance, and prolonged activity tolerance. Pt required moderate cuing as well as minimal assistance in standing for safe and  appropriate performance of today's interventions. Continue with therapeutic focus on transfer training, balance, global hip strength, standing tolerance, and foundational Gait training.   Eval Impression:Patient is a 73 y.o. female who was seen today for physical therapy evaluation and treatment for functional deconditioning and LE weakness following R femur ORIF 06/2022 with complex post-op course. She ambulates with a rollator and requires UE support to stand from chair.  Current neuropathy affects balance and proprioception.  OBJECTIVE IMPAIRMENTS: Abnormal gait, decreased activity tolerance, decreased balance, decreased coordination, decreased endurance, decreased mobility, difficulty walking, decreased strength, and impaired perceived functional ability.   ACTIVITY LIMITATIONS: carrying, lifting, sitting, standing, squatting, stairs, transfers, and locomotion level  PERSONAL FACTORS: Fitness, Past/current experiences, Time since onset of injury/illness/exacerbation, and 3+ comorbidities: RA, neuropathy and open wounds  are also affecting patient's functional outcome.   REHAB POTENTIAL: Good  CLINICAL DECISION MAKING: Stable/uncomplicated  EVALUATION COMPLEXITY: Low   GOALS: Goals reviewed with patient? No  SHORT TERM GOALS: Target date: 09/28/2023   Patient to demonstrate independence in HEP  Baseline: [email or web address] Goal status: INITIAL  2.  Assess for baseline mobility Baseline: TBD Goal status: INITIAL   LONG TERM GOALS: Target date: 10/23/2023  Patient will acknowledge 4/10 pain at least once during episode of care   Baseline: 7/10 Goal status: INITIAL  2.  Patient will increase 30s chair stand reps from 5 to 8 with arms to demonstrate and improved functional ability with less pain/difficulty as well as reduce fall risk.  Baseline: 5 Goal status: INITIAL  3.  Patient will score at least 38% on LEFS to signify clinically meaningful improvement in functional abilities.    Baseline: 28% (22/80) Goal status: INITIAL  4.  Re-assess 2 MWT for progress. Baseline:  Goal status: INITIAL  5.  10s stance time on positions 3,4 on mCTSIB Baseline: <5s each position Goal status: INITIAL    PLAN:  PT FREQUENCY: 1x/week  PT DURATION: 8 weeks  PLANNED INTERVENTIONS: [id number]- PT Re-evaluation, 97110-Therapeutic exercises, 97530- Therapeutic activity, [id number]- Neuromuscular re-education, 97535- Self Care, [id number]- Manual therapy, [phone]- Gait training, [phone]- Aquatic Therapy, Balance training, Stair training, and Dry Needling  PLAN FOR NEXT SESSION: HEP review and update, manual techniques as appropriate, aerobic tasks, ROM and flexibility activities, strengthening and PREs, TPDN, gait and balance training as needed    Mabel Kiang, PT, DPT 09/18/2023, 3:36 PM

## 2023-09-20 ENCOUNTER — Encounter: Payer: Self-pay | Admitting: Family Medicine

## 2023-09-24 ENCOUNTER — Other Ambulatory Visit: Payer: Self-pay | Admitting: Cardiology

## 2023-09-24 ENCOUNTER — Other Ambulatory Visit: Payer: Self-pay | Admitting: Adult Health

## 2023-09-25 ENCOUNTER — Ambulatory Visit: Payer: PPO | Admitting: Physical Therapy

## 2023-09-26 ENCOUNTER — Encounter: Payer: Self-pay | Admitting: Physical Therapy

## 2023-09-26 ENCOUNTER — Ambulatory Visit: Payer: PPO | Admitting: Physical Therapy

## 2023-09-26 DIAGNOSIS — R5381 Other malaise: Secondary | ICD-10-CM

## 2023-09-26 DIAGNOSIS — R2689 Other abnormalities of gait and mobility: Secondary | ICD-10-CM

## 2023-09-26 NOTE — Therapy (Signed)
OUTPATIENT PHYSICAL THERAPY TREATMENT NOTE  Patient Name: [name] MRN: [id number] DOB:09-04-1950, 73 y.o., female Today's Date: 09/26/2023  END OF SESSION:  PT End of Session - 09/26/23 1524     Visit Number 3    Number of Visits 8    Date for PT Re-Evaluation 10/26/23    PT Start Time 1530    PT Stop Time 1615    PT Time Calculation (min) 45 min    Activity Tolerance Patient tolerated treatment well    Behavior During Therapy [place] for tasks assessed/performed               Past Medical History:  Diagnosis Date   Agatston coronary artery calcium score greater than 400 05/2022   coronary Ca score 856   Back pain    Blood transfusion    1981   Chronic diastolic CHF (congestive heart failure) (HCC)    diastolic    COVID    DDD (degenerative disc disease), cervical    DDD (degenerative disc disease), lumbar    Fibromyalgia    GERD (gastroesophageal reflux disease)    Heart murmur    as a child   Hiatal hernia    sjorgens syndrome   High blood pressure    High cholesterol    IBS (irritable bowel syndrome)    OSA (obstructive sleep apnea)    Osteoarthritis    Peripheral autonomic neuropathy of unknown cause    Pre-diabetes    PVC (premature ventricular contraction)    Raynaud disease    Rheumatoid arthritis (HCC)    Sjogren's disease (HCC)    Urticaria    Vitamin D deficiency    Past Surgical History:  Procedure Laterality Date   ABDOMINAL HYSTERECTOMY     BTL, BSO   ADENOIDECTOMY     BREAST EXCISIONAL BIOPSY Left    BREAST SURGERY     mass removal    CHOLECYSTECTOMY     dental implants     DIAGNOSTIC LAPAROSCOPY     x3   FEMUR FRACTURE SURGERY Right    KNEE ARTHROSCOPY     x2   MASS EXCISION Left 03/22/2017   Procedure: EXCISION OF LEFT BREAST MASS;  Surgeon: Manus Rudd, MD;  Location: [facility];  Service: General;  Laterality: Left;   MULTIPLE TOOTH EXTRACTIONS  06/2023   x5   ORIF FEMUR FRACTURE Right 06/22/2022   Procedure: RIGHT OPEN  REDUCTION INTERNAL FIXATION (ORIF) DISTAL FEMUR FRACTURE;  Surgeon: Myrene Galas, MD;  Location: [facility];  Service: Orthopedics;  Laterality: Right;   patotid cystectomy     RIGHT/LEFT HEART CATH AND CORONARY ANGIOGRAPHY N/A 07/25/2019   Procedure: RIGHT/LEFT HEART CATH AND CORONARY ANGIOGRAPHY;  Surgeon: Dolores Patty, MD;  Location: [facility];  Service: Cardiovascular;  Laterality: N/A;   TENDON REPAIR  1980   left ankle and tibia   TONSILLECTOMY     TOTAL KNEE ARTHROPLASTY  06/21/2011   Procedure: TOTAL KNEE ARTHROPLASTY;  Surgeon: Loanne Drilling;  Location: [facility];  Service: Orthopedics;  Laterality: Right;   TUBAL LIGATION     Patient Active Problem List   Diagnosis Date Noted   Sacral ulcer (HCC) 07/30/2023   IDA (iron deficiency anemia) 06/05/2023   Normocytic normochromic anemia 06/05/2023   Normocytic anemia 06/01/2023   Situational anxiety 10/15/2022   Pressure sore of left ischial area, stage IV (HCC) 09/10/2022   Pain in right leg 09/10/2022   Aftercare for healing traumatic fracture of right  femur 06/21/2022   DNR (do not resuscitate) 06/21/2022   Agatston coronary artery calcium score greater than 400 05/25/2022   Laryngopharyngeal reflux (LPR) 10/10/2021   Chronic maxillary sinusitis 10/10/2021   Prediabetes 02/14/2021   Muscle spasm of back 08/01/2020   Microalbuminuria 08/01/2020   Chronic kidney disease, stage 3b (HCC) 08/01/2020   Post-menopausal 09/02/2019   Recurrent urticaria 05/20/2018   Vitamin D deficiency 11/20/2017   Mixed hyperlipidemia 11/20/2017   Family history of coronary arteriosclerosis- strong fam h/o CAD and early CAD.  07/17/2017   Raynaud's disease without gangrene 03/12/2017   Eosinophilic esophagitis 10/12/2016   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   Autoimmune disease (HCC) 09/02/2016   Abnormality of gait 05/09/2016   Sjogren's syndrome (HCC) 02/29/2016   GERD (gastroesophageal reflux disease) 01/19/2016    Glucose intolerance (impaired glucose tolerance) 01/19/2016   Chronic diastolic CHF (congestive heart failure) (HCC) 04/20/2015   Peripheral polyneuropathy 02/02/2014   Class 2 obesity due to excess calories with body mass index (BMI) of 36.0 to 36.9 in adult 11/17/2013   Heart murmur    OSA (obstructive sleep apnea)    Rheumatoid arthritis (HCC)    Hiatal hernia    Fibromyalgia    PVC's (premature ventricular contractions)    Unilateral primary osteoarthritis, left knee 06/22/2011    PCP: Melida Quitter, PA  REFERRING PROVIDER: Melida Quitter, PA  REFERRING DIAG: [phone] (ICD-10-CM) - Aftercare for healing traumatic fracture of right femur  THERAPY DIAG:  Physical deconditioning  Other abnormalities of gait and mobility  Rationale for Evaluation and Treatment: Rehabilitation  ONSET DATE: 06/22/22  SUBJECTIVE:   SUBJECTIVE STATEMENT: Pt stated she's feeling good today. At end of session pt stated that today was a good day and she likes the way the session went and focus from here on out.  Sustained a R distal femur fracture requiring ORIF.  Has had both inpatient rehab as well as HHPT.  Her last formal PT session was March 2024.  She developed pressure ulcers on her R heel and buttock.  Her heel has healed and her buttock ulcer is healing.   PERTINENT HISTORY: Aftercare for healing traumatic fracture of right femur -     Methocarbamol; Take 1 tablet (500 mg total) by mouth every 6 (six) hours as needed for muscle spasms.  Dispense: 120 tablet; Refill: 1 -     Ambulatory referral to Physical Therapy   Pain in right leg -     Methocarbamol; Take 1 tablet (500 mg total) by mouth every 6 (six) hours as needed for muscle spasms.  Dispense: 120 tablet; Refill: 1 PAIN:  Are you having pain? Yes: NPRS scale: 7/10 Pain location: global Pain description: ache, sore Aggravating factors: undetermined Relieving factors: medications and indeterminate  PRECAUTIONS: None  RED  FLAGS: None   WEIGHT BEARING RESTRICTIONS: No  FALLS:  Has patient fallen in last 6 months? No  OCCUPATION: retired  PLOF: Independent  PATIENT GOALS: To increase my strength and mobility  NEXT MD VISIT: 3 months  OBJECTIVE:  Note: Objective measures were completed at Evaluation unless otherwise noted.  DIAGNOSTIC FINDINGS:  Narrative & Impression  CLINICAL DATA:  Fracture, postop.   EXAM: PORTABLE RIGHT KNEE - 1-2 VIEW; RIGHT FEMUR PORTABLE 2 VIEW   COMPARISON:  Preoperative imaging.   FINDINGS: Lateral plate and multi screw fixation of distal femur fracture. The fracture is in improved alignment with mild persistent displacement. There is no new periprosthetic lucency. Right knee arthroplasty is  in place. Alignment remains congruent. Recent postsurgical change includes air and edema in the joint space and soft tissues.   IMPRESSION: ORIF distal femur fracture, in improved alignment. No immediate postoperative complication.     Electronically Signed   By: Narda Rutherford M.D.   On: 06/22/2022 15:08    PATIENT SURVEYS:  LEFS 22/80  MUSCLE LENGTH: Hamstrings: WFL for gait and transfers (assessed seated) Thomas test: UTA  POSTURE: deferred  PALPATION: deferred  LOWER EXTREMITY ROM:  Passive ROM Right eval Left eval  Hip flexion    Hip extension    Hip abduction    Hip adduction    Hip internal rotation    Hip external rotation    Knee flexion    Knee extension    Ankle dorsiflexion    Ankle plantarflexion    Ankle inversion    Ankle eversion     (Blank rows = not tested)  LOWER EXTREMITY MMT:  MMT Right eval Left eval  Hip flexion    Hip extension    Hip abduction    Hip adduction    Hip internal rotation    Hip external rotation    Knee flexion    Knee extension    Ankle dorsiflexion    Ankle plantarflexion    Ankle inversion    Ankle eversion     (Blank rows = not tested)  LOWER EXTREMITY SPECIAL TESTS:  mCTSIB 30s at  position 1,2; unable to maintain position 3,4 for >5 s  FUNCTIONAL TESTS:  30 seconds chair stand test 5 reps with UE support  GAIT: Distance walked: 75ftx2  Assistive device utilized: Environmental consultant - 4 wheeled Level of assistance: Complete Independence Comments: slow cadence    [facility] Adult PT Treatment:                                                DATE: 09/26/2023  Therapeutic Exercise: Nustep level 3 x 5 mins Mini squat with UE assist 2x10  Gait training Walking no AD in //, hand hovering Walking backwards in // no AD hand hovering Neuromuscular re-ed Tandem stance  4-way weight shift w/ Stepping strategy training                                                                                                                              TREATMENT DATE:  [facility] Adult PT Treatment:                                                DATE: 09/18/2023  Therapeutic Exercise: Nustep level 3 x 5 mins Banded seated clamshell 2x12, 3s hold, red Therapeutic Activity: Standing marches with band 2x1', yellow  Standing reciprocal Hip extension, BW, 2x15 2s hold ea. LAQ w/lift x10, 3s hold STS 2x8 Bridges (d/c d/t intolerance for supine position and Knee ROM )   08/28/23 Eval and HEP   PATIENT EDUCATION:  Education details: Discussed eval findings, rehab rationale and POC and patient is in agreement  Person educated: Patient Education method: Explanation Education comprehension: verbalized understanding and needs further education  HOME EXERCISE PROGRAM: Access Code: [email or web address] URL: [email or web address] Date: 08/28/2023 Prepared by: Gustavus Bryant  Exercises - Sit to Stand with Counter Support  - 2 x daily - 5 x weekly - 1 sets - 5 reps - Standing Heel Raise with Support  - 2 x daily - 5 x weekly - 2 sets - 10 reps  ASSESSMENT:  CLINICAL IMPRESSION: Pt attended physical therapy session for continuation of treatment regarding deconditioning s/p R femur ORIF. Today's  treatment focused on improvement of building stability in available ROM as well as foundational strengthening to improve gait quality and balance strategies. Pt showed  great tolerance to treatment and demonstrated improvement with movement quality indicating an improvement in hip strength, as well as standing tolerance and stepping strategies. Pt required moderate cuing as well as minimal assistance in standing for safe and appropriate performance of today's interventions. Continue with therapeutic focus on  balance strategies, global hip strength, standing tolerance, and  Gait training with LRAD.   Eval Impression:Patient is a 73 y.o. female who was seen today for physical therapy evaluation and treatment for functional deconditioning and LE weakness following R femur ORIF 06/2022 with complex post-op course. She ambulates with a rollator and requires UE support to stand from chair.  Current neuropathy affects balance and proprioception.  OBJECTIVE IMPAIRMENTS: Abnormal gait, decreased activity tolerance, decreased balance, decreased coordination, decreased endurance, decreased mobility, difficulty walking, decreased strength, and impaired perceived functional ability.   ACTIVITY LIMITATIONS: carrying, lifting, sitting, standing, squatting, stairs, transfers, and locomotion level  PERSONAL FACTORS: Fitness, Past/current experiences, Time since onset of injury/illness/exacerbation, and 3+ comorbidities: RA, neuropathy and open wounds  are also affecting patient's functional outcome.   REHAB POTENTIAL: Good  CLINICAL DECISION MAKING: Stable/uncomplicated  EVALUATION COMPLEXITY: Low   GOALS: Goals reviewed with patient? No  SHORT TERM GOALS: Target date: 09/28/2023   Patient to demonstrate independence in HEP  Baseline: [email or web address] Goal status: INITIAL  2.  Assess for baseline mobility Baseline: TBD Goal status: INITIAL   LONG TERM GOALS: Target date: 10/23/2023  Patient will  acknowledge 4/10 pain at least once during episode of care   Baseline: 7/10 Goal status: INITIAL  2.  Patient will increase 30s chair stand reps from 5 to 8 with arms to demonstrate and improved functional ability with less pain/difficulty as well as reduce fall risk.  Baseline: 5 Goal status: INITIAL  3.  Patient will score at least 38% on LEFS to signify clinically meaningful improvement in functional abilities.   Baseline: 28% (22/80) Goal status: INITIAL  4.  Re-assess 2 MWT for progress. Baseline:  Goal status: INITIAL  5.  10s stance time on positions 3,4 on mCTSIB Baseline: <5s each position Goal status: INITIAL    PLAN:  PT FREQUENCY: 1x/week  PT DURATION: 8 weeks  PLANNED INTERVENTIONS: 97164- PT Re-evaluation, 97110-Therapeutic exercises, 97530- Therapeutic activity, [id number]- Neuromuscular re-education, 97535- Self Care, [id number]- Manual therapy, [id number]- Gait training, [phone]- Aquatic Therapy, Balance training, Stair training, and Dry Needling  PLAN FOR NEXT SESSION: HEP review and update, manual techniques as appropriate, aerobic tasks, ROM and flexibility activities,  strengthening and PREs, TPDN, gait and balance training as needed    Sheliah Plane, PT, DPT 09/26/2023, 4:22 PM

## 2023-09-29 NOTE — Progress Notes (Unsigned)
  Cardiology Office Note:  .   Date:  09/29/2023  ID:  [name], DOB 06-Nov-1950, MRN [id number] PCP: Melida Quitter, PA  [facility] Providers Cardiologist:  Armanda Magic, MD {  History of Present Illness: .   [name] is a 73 y.o. female w/PMHx of RA, chronic diastolic chf, fibromyalgia, Sjogren's, urticaria HTN, HLD OSA w/CPAP PVCs  She was referred to dr. Ladona Ridgel April 2023 for PVCs (16% burden at that time), given some renal disease > started on amiodarone  Last saw him Jan 2024, recovering from a broken femur, though doing well otherwise Palpitations reported as much improved Discussed elevated coronary Ca score > deferred to Dr. Mendel Corning Planned for surveillance labs  Today's visit is scheduled as an annual visit  ROS:   She is unaware of her PVCs anymore, had been, the amiodarone has helped her quite a bit No CP, SOB, DOE No near syncope or syncope. RA and he knee pain keep her activity very limited  Arrhythmia/AAD hx PVCs (morphology is within the septum with a QR complex in V1 and inferior axis) Amiodarone started 2023  Studies Reviewed: [name]    EKG done today and reviewed by myself:  SB 59bpm, 1st degree AVblock , icRBBB, no PVCs   11/15/21; TTE 1. Left ventricular ejection fraction, by estimation, is 60 to 65%. The  left ventricle has normal function. The left ventricle has no regional  wall motion abnormalities. There is mild concentric left ventricular  hypertrophy. Left ventricular diastolic  parameters are consistent with Grade I diastolic dysfunction (impaired  relaxation).   2. Right ventricular systolic function is normal. The right ventricular  size is normal.   3. The mitral valve is normal in structure. Trivial mitral valve  regurgitation.   4. The aortic valve is tricuspid. There is mild calcification of the  aortic valve. There is mild thickening of the aortic valve. Aortic valve  regurgitation is not visualized. Aortic valve  sclerosis/calcification is  present, without any evidence of  aortic stenosis.    Risk Assessment/Calculations:    Physical Exam:   VS:  There were no vitals taken for this visit.   Wt Readings from Last 3 Encounters:  07/30/23 213 lb (96.6 kg)  07/17/23 217 lb 6.4 oz (98.6 kg)  07/10/23 215 lb 12.8 oz (97.9 kg)    GEN: Well nourished, well developed in no acute distress NECK: No JVD; No carotid bruits CARDIAC: RRR, no murmurs, rubs, gallops RESPIRATORY:  CTA b/l without rales, wheezing or rhonchi  ABDOMEN: Soft, non-tender, non-distended EXTREMITIES: No edema; No deformity   ASSESSMENT AND PLAN: .    PVCs Chronic amiodarone Labs are UTD None on her EKG or exam   Encouraged heart healthy diet, chair exercises to her ability    Dispo: back in 5mo, sooner if needed  Signed, Sheilah Pigeon, PA-C

## 2023-10-01 ENCOUNTER — Ambulatory Visit: Payer: PPO | Attending: Physician Assistant | Admitting: Physician Assistant

## 2023-10-01 ENCOUNTER — Encounter: Payer: Self-pay | Admitting: Physician Assistant

## 2023-10-01 VITALS — BP 114/68 | HR 59 | Ht 62.5 in | Wt 216.0 lb

## 2023-10-01 DIAGNOSIS — I493 Ventricular premature depolarization: Secondary | ICD-10-CM

## 2023-10-01 NOTE — Patient Instructions (Signed)
Medication Instructions:    Your physician recommends that you continue on your current medications as directed. Please refer to the Current Medication list given to you today.   *If you need a refill on your cardiac medications before your next appointment, please call your pharmacy*   Lab Work:  NONE ORDERED  TODAY     If you have labs (blood work) drawn today and your tests are completely normal, you will receive your results only by: MyChart Message (if you have MyChart) OR A paper copy in the mail If you have any lab test that is abnormal or we need to change your treatment, we will call you to review the results.   Testing/Procedures: NONE ORDERED  TODAY     Follow-Up: At [facility], you and your health needs are our priority.  As part of our continuing mission to provide you with exceptional heart care, we have created designated Provider Care Teams.  These Care Teams include your primary Cardiologist (physician) and Advanced Practice Providers (APPs -  Physician Assistants and Nurse Practitioners) who all work together to provide you with the care you need, when you need it.  We recommend signing up for the patient portal called "MyChart".  Sign up information is provided on this After Visit Summary.  MyChart is used to connect with patients for Virtual Visits (Telemedicine).  Patients are able to view lab/test results, encounter notes, upcoming appointments, etc.  Non-urgent messages can be sent to your provider as well.   To learn more about what you can do with MyChart, go to [email or web address].    Your next appointment:   6 month(s)  Provider:   You may see  or one of the following Advanced Practice Providers on your designated Care Team:   Francis Dowse, [place]  Other Instructions    1st Floor: - Lobby - Registration  - Pharmacy  - Lab - Cafe  2nd Floor: - PV Lab - Diagnostic Testing (echo, CT, nuclear med)  3rd Floor: - Vacant  4th  Floor: - TCTS (cardiothoracic surgery) - AFib Clinic - Structural Heart Clinic - Vascular Surgery  - Vascular Ultrasound  5th Floor: - HeartCare Cardiology (general and EP) - Clinical Pharmacy for coumadin, hypertension, lipid, weight-loss medications, and med management appointments    Valet parking services will be available as well.

## 2023-10-03 ENCOUNTER — Ambulatory Visit: Payer: PPO | Admitting: Physical Therapy

## 2023-10-04 DIAGNOSIS — I5032 Chronic diastolic (congestive) heart failure: Secondary | ICD-10-CM | POA: Diagnosis not present

## 2023-10-04 DIAGNOSIS — L89314 Pressure ulcer of right buttock, stage 4: Secondary | ICD-10-CM | POA: Diagnosis not present

## 2023-10-04 DIAGNOSIS — R7302 Impaired glucose tolerance (oral): Secondary | ICD-10-CM | POA: Diagnosis not present

## 2023-10-04 DIAGNOSIS — L89313 Pressure ulcer of right buttock, stage 3: Secondary | ICD-10-CM | POA: Diagnosis not present

## 2023-10-04 DIAGNOSIS — N1832 Chronic kidney disease, stage 3b: Secondary | ICD-10-CM | POA: Diagnosis not present

## 2023-10-09 ENCOUNTER — Other Ambulatory Visit (INDEPENDENT_AMBULATORY_CARE_PROVIDER_SITE_OTHER): Payer: Self-pay

## 2023-10-09 ENCOUNTER — Ambulatory Visit: Payer: PPO | Admitting: Physical Therapy

## 2023-10-09 NOTE — Therapy (Incomplete)
OUTPATIENT PHYSICAL THERAPY TREATMENT NOTE  Patient Name: [name] MRN: [id number] DOB:29-Jul-1951, 73 y.o., female Today's Date: 10/09/2023  END OF SESSION:      Past Medical History:  Diagnosis Date   Agatston coronary artery calcium score greater than 400 05/2022   coronary Ca score 856   Back pain    Blood transfusion    1981   Chronic diastolic CHF (congestive heart failure) (HCC)    diastolic    COVID    DDD (degenerative disc disease), cervical    DDD (degenerative disc disease), lumbar    Fibromyalgia    GERD (gastroesophageal reflux disease)    Heart murmur    as a child   Hiatal hernia    sjorgens syndrome   High blood pressure    High cholesterol    IBS (irritable bowel syndrome)    OSA (obstructive sleep apnea)    Osteoarthritis    Peripheral autonomic neuropathy of unknown cause    Pre-diabetes    PVC (premature ventricular contraction)    Raynaud disease    Rheumatoid arthritis (HCC)    Sjogren's disease (HCC)    Urticaria    Vitamin D deficiency    Past Surgical History:  Procedure Laterality Date   ABDOMINAL HYSTERECTOMY     BTL, BSO   ADENOIDECTOMY     BREAST EXCISIONAL BIOPSY Left    BREAST SURGERY     mass removal    CHOLECYSTECTOMY     dental implants     DIAGNOSTIC LAPAROSCOPY     x3   FEMUR FRACTURE SURGERY Right    KNEE ARTHROSCOPY     x2   MASS EXCISION Left 03/22/2017   Procedure: EXCISION OF LEFT BREAST MASS;  Surgeon: Manus Rudd, MD;  Location: [facility];  Service: General;  Laterality: Left;   MULTIPLE TOOTH EXTRACTIONS  06/2023   x5   ORIF FEMUR FRACTURE Right 06/22/2022   Procedure: RIGHT OPEN REDUCTION INTERNAL FIXATION (ORIF) DISTAL FEMUR FRACTURE;  Surgeon: Myrene Galas, MD;  Location: [facility];  Service: Orthopedics;  Laterality: Right;   patotid cystectomy     RIGHT/LEFT HEART CATH AND CORONARY ANGIOGRAPHY N/A 07/25/2019   Procedure: RIGHT/LEFT HEART CATH AND CORONARY ANGIOGRAPHY;  Surgeon: Dolores Patty,  MD;  Location: [facility];  Service: Cardiovascular;  Laterality: N/A;   TENDON REPAIR  1980   left ankle and tibia   TONSILLECTOMY     TOTAL KNEE ARTHROPLASTY  06/21/2011   Procedure: TOTAL KNEE ARTHROPLASTY;  Surgeon: Loanne Drilling;  Location: [facility];  Service: Orthopedics;  Laterality: Right;   TUBAL LIGATION     Patient Active Problem List   Diagnosis Date Noted   Sacral ulcer (HCC) 07/30/2023   IDA (iron deficiency anemia) 06/05/2023   Normocytic normochromic anemia 06/05/2023   Normocytic anemia 06/01/2023   Situational anxiety 10/15/2022   Pressure sore of left ischial area, stage IV (HCC) 09/10/2022   Pain in right leg 09/10/2022   Aftercare for healing traumatic fracture of right femur 06/21/2022   DNR (do not resuscitate) 06/21/2022   Agatston coronary artery calcium score greater than 400 05/25/2022   Laryngopharyngeal reflux (LPR) 10/10/2021   Chronic maxillary sinusitis 10/10/2021   Prediabetes 02/14/2021   Muscle spasm of back 08/01/2020   Microalbuminuria 08/01/2020   Chronic kidney disease, stage 3b (HCC) 08/01/2020   Post-menopausal 09/02/2019   Recurrent urticaria 05/20/2018   Vitamin D deficiency 11/20/2017   Mixed hyperlipidemia 11/20/2017   Family history of  coronary arteriosclerosis- strong fam h/o CAD and early CAD.  07/17/2017   Raynaud's disease without gangrene 03/12/2017   Eosinophilic esophagitis 10/12/2016   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   Autoimmune disease (HCC) 09/02/2016   Abnormality of gait 05/09/2016   Sjogren's syndrome (HCC) 02/29/2016   GERD (gastroesophageal reflux disease) 01/19/2016   Glucose intolerance (impaired glucose tolerance) 01/19/2016   Chronic diastolic CHF (congestive heart failure) (HCC) 04/20/2015   Peripheral polyneuropathy 02/02/2014   Class 2 obesity due to excess calories with body mass index (BMI) of 36.0 to 36.9 in adult 11/17/2013   Heart murmur    OSA (obstructive sleep apnea)     Rheumatoid arthritis (HCC)    Hiatal hernia    Fibromyalgia    PVC's (premature ventricular contractions)    Unilateral primary osteoarthritis, left knee 06/22/2011    PCP: Melida Quitter, PA  REFERRING PROVIDER: Melida Quitter, PA  REFERRING DIAG: [phone] (ICD-10-CM) - Aftercare for healing traumatic fracture of right femur  THERAPY DIAG:  No diagnosis found.  Rationale for Evaluation and Treatment: Rehabilitation  ONSET DATE: 06/22/22  SUBJECTIVE:   SUBJECTIVE STATEMENT: ***  Sustained a R distal femur fracture requiring ORIF.  Has had both inpatient rehab as well as HHPT.  Her last formal PT session was March 2024.  She developed pressure ulcers on her R heel and buttock.  Her heel has healed and her buttock ulcer is healing.   PERTINENT HISTORY: Aftercare for healing traumatic fracture of right femur -     Methocarbamol; Take 1 tablet (500 mg total) by mouth every 6 (six) hours as needed for muscle spasms.  Dispense: 120 tablet; Refill: 1 -     Ambulatory referral to Physical Therapy   Pain in right leg -     Methocarbamol; Take 1 tablet (500 mg total) by mouth every 6 (six) hours as needed for muscle spasms.  Dispense: 120 tablet; Refill: 1 PAIN:  Are you having pain? Yes: NPRS scale: 7/10 Pain location: global Pain description: ache, sore Aggravating factors: undetermined Relieving factors: medications and indeterminate  PRECAUTIONS: None  RED FLAGS: None   WEIGHT BEARING RESTRICTIONS: No  FALLS:  Has patient fallen in last 6 months? No  OCCUPATION: retired  PLOF: Independent  PATIENT GOALS: To increase my strength and mobility  NEXT MD VISIT: 3 months  OBJECTIVE:  Note: Objective measures were completed at Evaluation unless otherwise noted.  DIAGNOSTIC FINDINGS:  Narrative & Impression  CLINICAL DATA:  Fracture, postop.   EXAM: PORTABLE RIGHT KNEE - 1-2 VIEW; RIGHT FEMUR PORTABLE 2 VIEW   COMPARISON:  Preoperative imaging.    FINDINGS: Lateral plate and multi screw fixation of distal femur fracture. The fracture is in improved alignment with mild persistent displacement. There is no new periprosthetic lucency. Right knee arthroplasty is in place. Alignment remains congruent. Recent postsurgical change includes air and edema in the joint space and soft tissues.   IMPRESSION: ORIF distal femur fracture, in improved alignment. No immediate postoperative complication.     Electronically Signed   By: Narda Rutherford M.D.   On: 06/22/2022 15:08    PATIENT SURVEYS:  LEFS 22/80  MUSCLE LENGTH: Hamstrings: WFL for gait and transfers (assessed seated) Maisie Fus test: UTA  POSTURE: deferred  PALPATION: deferred  LOWER EXTREMITY ROM:  Passive ROM Right eval Left eval  Hip flexion    Hip extension    Hip abduction    Hip adduction    Hip internal rotation    Hip  external rotation    Knee flexion    Knee extension    Ankle dorsiflexion    Ankle plantarflexion    Ankle inversion    Ankle eversion     (Blank rows = not tested)  LOWER EXTREMITY MMT:  MMT Right eval Left eval  Hip flexion    Hip extension    Hip abduction    Hip adduction    Hip internal rotation    Hip external rotation    Knee flexion    Knee extension    Ankle dorsiflexion    Ankle plantarflexion    Ankle inversion    Ankle eversion     (Blank rows = not tested)  LOWER EXTREMITY SPECIAL TESTS:  mCTSIB 30s at position 1,2; unable to maintain position 3,4 for >5 s  FUNCTIONAL TESTS:  30 seconds chair stand test 5 reps with UE support  GAIT: Distance walked: 2ftx2  Assistive device utilized: Environmental consultant - 4 wheeled Level of assistance: Complete Independence Comments: slow cadence    [facility] Adult PT Treatment:                                                DATE: 10/09/2023  Therapeutic Exercise: Nustep level 3 x 5 mins Mini squat with UE assist 2x10  Gait training Walking no AD in //, hand hovering Walking  backwards in // no AD hand hovering Neuromuscular re-ed Tandem stance  4-way weight shift w/ Stepping strategy training                                                                                                           [facility] Adult PT Treatment:                                                DATE: 09/26/2023  Therapeutic Exercise: Nustep level 3 x 5 mins Mini squat with UE assist 2x10  Gait training Walking no AD in //, hand hovering Walking backwards in // no AD hand hovering Neuromuscular re-ed Tandem stance  4-way weight shift w/ Stepping strategy training  PATIENT EDUCATION:  Education details: Discussed eval findings, rehab rationale and POC and patient is in agreement  Person educated: Patient Education method: Explanation Education comprehension: verbalized understanding and needs further education  HOME EXERCISE PROGRAM: Access Code: [email or web address] URL: [email or web address] Date: 08/28/2023 Prepared by: Gustavus Bryant  Exercises - Sit to Stand with Counter Support  - 2 x daily - 5 x weekly - 1 sets - 5 reps - Standing Heel Raise with Support  - 2 x daily - 5 x weekly - 2 sets - 10 reps  ASSESSMENT:  CLINICAL IMPRESSION: Pt attended physical therapy session for continuation of treatment regarding deconditioning s/p R femur ORIF. Today's treatment focused on improvement of building stability in available ROM as well as foundational strengthening to improve gait quality and balance strategies. Pt showed  great tolerance to treatment and demonstrated improvement with movement quality indicating an improvement in hip strength, as well as standing tolerance and stepping strategies. Pt required moderate cuing as well as minimal assistance in standing for safe and appropriate performance of today's interventions. Continue with therapeutic focus  on  balance strategies, global hip strength, standing tolerance, and  Gait training with LRAD.   Eval Impression:Patient is a 73 y.o. female who was seen today for physical therapy evaluation and treatment for functional deconditioning and LE weakness following R femur ORIF 06/2022 with complex post-op course. She ambulates with a rollator and requires UE support to stand from chair.  Current neuropathy affects balance and proprioception.  OBJECTIVE IMPAIRMENTS: Abnormal gait, decreased activity tolerance, decreased balance, decreased coordination, decreased endurance, decreased mobility, difficulty walking, decreased strength, and impaired perceived functional ability.   ACTIVITY LIMITATIONS: carrying, lifting, sitting, standing, squatting, stairs, transfers, and locomotion level  PERSONAL FACTORS: Fitness, Past/current experiences, Time since onset of injury/illness/exacerbation, and 3+ comorbidities: RA, neuropathy and open wounds  are also affecting patient's functional outcome.   REHAB POTENTIAL: Good  CLINICAL DECISION MAKING: Stable/uncomplicated  EVALUATION COMPLEXITY: Low   GOALS: Goals reviewed with patient? No  SHORT TERM GOALS: Target date: 09/28/2023   Patient to demonstrate independence in HEP  Baseline: [email or web address] Goal status: INITIAL  2.  Assess for baseline mobility Baseline: TBD Goal status: INITIAL   LONG TERM GOALS: Target date: 10/23/2023  Patient will acknowledge 4/10 pain at least once during episode of care   Baseline: 7/10 Goal status: INITIAL  2.  Patient will increase 30s chair stand reps from 5 to 8 with arms to demonstrate and improved functional ability with less pain/difficulty as well as reduce fall risk.  Baseline: 5 Goal status: INITIAL  3.  Patient will score at least 38% on LEFS to signify clinically meaningful improvement in functional abilities.   Baseline: 28% (22/80) Goal status: INITIAL  4.  Re-assess 2 MWT for  progress. Baseline:  Goal status: INITIAL  5.  10s stance time on positions 3,4 on mCTSIB Baseline: <5s each position Goal status: INITIAL    PLAN:  PT FREQUENCY: 1x/week  PT DURATION: 8 weeks  PLANNED INTERVENTIONS: 97164- PT Re-evaluation, 97110-Therapeutic exercises, 97530- Therapeutic activity, [id number]- Neuromuscular re-education, 97535- Self Care, [id number]- Manual therapy, [phone]- Gait training, [phone]- Aquatic Therapy, Balance training, Stair training, and Dry Needling  PLAN FOR NEXT SESSION: HEP review and update, manual techniques as appropriate, aerobic tasks, ROM and flexibility activities, strengthening and PREs, TPDN, gait and balance training as needed    Sheliah Plane, PT, DPT 10/09/2023, 8:50 AM

## 2023-10-10 ENCOUNTER — Encounter (HOSPITAL_BASED_OUTPATIENT_CLINIC_OR_DEPARTMENT_OTHER): Payer: PPO | Attending: General Surgery | Admitting: General Surgery

## 2023-10-10 DIAGNOSIS — I5032 Chronic diastolic (congestive) heart failure: Secondary | ICD-10-CM | POA: Diagnosis not present

## 2023-10-10 DIAGNOSIS — R7302 Impaired glucose tolerance (oral): Secondary | ICD-10-CM | POA: Diagnosis not present

## 2023-10-10 DIAGNOSIS — N1832 Chronic kidney disease, stage 3b: Secondary | ICD-10-CM | POA: Diagnosis not present

## 2023-10-10 DIAGNOSIS — Z872 Personal history of diseases of the skin and subcutaneous tissue: Secondary | ICD-10-CM | POA: Insufficient documentation

## 2023-10-10 DIAGNOSIS — I7300 Raynaud's syndrome without gangrene: Secondary | ICD-10-CM | POA: Diagnosis not present

## 2023-10-10 DIAGNOSIS — Z6837 Body mass index (BMI) 37.0-37.9, adult: Secondary | ICD-10-CM | POA: Diagnosis not present

## 2023-10-10 DIAGNOSIS — E669 Obesity, unspecified: Secondary | ICD-10-CM | POA: Diagnosis not present

## 2023-10-10 DIAGNOSIS — Z09 Encounter for follow-up examination after completed treatment for conditions other than malignant neoplasm: Secondary | ICD-10-CM | POA: Diagnosis not present

## 2023-10-10 DIAGNOSIS — L89314 Pressure ulcer of right buttock, stage 4: Secondary | ICD-10-CM | POA: Diagnosis not present

## 2023-10-12 ENCOUNTER — Other Ambulatory Visit (INDEPENDENT_AMBULATORY_CARE_PROVIDER_SITE_OTHER): Payer: Self-pay | Admitting: Family Medicine

## 2023-10-12 ENCOUNTER — Other Ambulatory Visit: Payer: Self-pay | Admitting: Cardiology

## 2023-10-12 ENCOUNTER — Other Ambulatory Visit: Payer: Self-pay | Admitting: Physician Assistant

## 2023-10-12 DIAGNOSIS — J3089 Other allergic rhinitis: Secondary | ICD-10-CM

## 2023-10-12 NOTE — Telephone Encounter (Signed)
 Last Fill: 07/10/2023  Eye exam: 03/05/2023 WNL   Labs: 08/01/2023 BUN 35, Creat. 1.30, GFR 44, Phosphorus 4.4, Hgb 11.0  Next Visit: 11/08/2023  Last Visit: 07/10/2023  [id number] arthritis involving multiple sites with positive rheumatoid factor   Current Dose per office note 07/10/2023:  Plaquenil 200 mg 1 tablet by mouth by mouth daily-   Okay to refill Plaquenil?

## 2023-10-16 ENCOUNTER — Ambulatory Visit: Payer: PPO | Attending: Family Medicine | Admitting: Physical Therapy

## 2023-10-16 ENCOUNTER — Encounter: Payer: Self-pay | Admitting: Physical Therapy

## 2023-10-16 DIAGNOSIS — R2689 Other abnormalities of gait and mobility: Secondary | ICD-10-CM | POA: Insufficient documentation

## 2023-10-16 DIAGNOSIS — R5381 Other malaise: Secondary | ICD-10-CM | POA: Insufficient documentation

## 2023-10-16 NOTE — Therapy (Signed)
 OUTPATIENT PHYSICAL THERAPY TREATMENT NOTE  Patient Name: [name] MRN: [id number] DOB:March 27, 1951, 73 y.o., female Today's Date: 10/16/2023  END OF SESSION:  PT End of Session - 10/16/23 1528     Visit Number 4    Number of Visits 8    Date for PT Re-Evaluation 10/26/23    PT Start Time 1530    PT Stop Time 1610    PT Time Calculation (min) 40 min    Activity Tolerance Patient tolerated treatment well    Behavior During Therapy [facility] for tasks assessed/performed                Past Medical History:  Diagnosis Date   Agatston coronary artery calcium score greater than 400 05/2022   coronary Ca score 856   Back pain    Blood transfusion    1981   Chronic diastolic CHF (congestive heart failure) (HCC)    diastolic    COVID    DDD (degenerative disc disease), cervical    DDD (degenerative disc disease), lumbar    Fibromyalgia    GERD (gastroesophageal reflux disease)    Heart murmur    as a child   Hiatal hernia    sjorgens syndrome   High blood pressure    High cholesterol    IBS (irritable bowel syndrome)    OSA (obstructive sleep apnea)    Osteoarthritis    Peripheral autonomic neuropathy of unknown cause    Pre-diabetes    PVC (premature ventricular contraction)    Raynaud disease    Rheumatoid arthritis (HCC)    Sjogren's disease (HCC)    Urticaria    Vitamin D deficiency    Past Surgical History:  Procedure Laterality Date   ABDOMINAL HYSTERECTOMY     BTL, BSO   ADENOIDECTOMY     BREAST EXCISIONAL BIOPSY Left    BREAST SURGERY     mass removal    CHOLECYSTECTOMY     dental implants     DIAGNOSTIC LAPAROSCOPY     x3   FEMUR FRACTURE SURGERY Right    KNEE ARTHROSCOPY     x2   MASS EXCISION Left 03/22/2017   Procedure: EXCISION OF LEFT BREAST MASS;  Surgeon: Manus Rudd, MD;  Location: [facility];  Service: General;  Laterality: Left;   MULTIPLE TOOTH EXTRACTIONS  06/2023   x5   ORIF FEMUR FRACTURE Right 06/22/2022   Procedure: RIGHT  OPEN REDUCTION INTERNAL FIXATION (ORIF) DISTAL FEMUR FRACTURE;  Surgeon: Myrene Galas, MD;  Location: [facility] OR;  Service: Orthopedics;  Laterality: Right;   patotid cystectomy     RIGHT/LEFT HEART CATH AND CORONARY ANGIOGRAPHY N/A 07/25/2019   Procedure: RIGHT/LEFT HEART CATH AND CORONARY ANGIOGRAPHY;  Surgeon: Dolores Patty, MD;  Location: [facility]AS[facility];  Service: Cardiovascular;  Laterality: N/A;   TENDON REPAIR  1980   left ankle and tibia   TONSILLECTOMY     TOTAL KNEE ARTHROPLASTY  06/21/2011   Procedure: TOTAL KNEE ARTHROPLASTY;  Surgeon: Loanne Drilling;  Location: [facility];  Service: Orthopedics;  Laterality: Right;   TUBAL LIGATION     Patient Active Problem List   Diagnosis Date Noted   Sacral ulcer (HCC) 07/30/2023   IDA (iron deficiency anemia) 06/05/2023   Normocytic normochromic anemia 06/05/2023   Normocytic anemia 06/01/2023   Situational anxiety 10/15/2022   Pressure sore of left ischial area, stage IV (HCC) 09/10/2022   Pain in right leg 09/10/2022   Aftercare for healing traumatic fracture of  right femur 06/21/2022   DNR (do not resuscitate) 06/21/2022   Agatston coronary artery calcium score greater than 400 05/25/2022   Laryngopharyngeal reflux (LPR) 10/10/2021   Chronic maxillary sinusitis 10/10/2021   Prediabetes 02/14/2021   Muscle spasm of back 08/01/2020   Microalbuminuria 08/01/2020   Chronic kidney disease, stage 3b (HCC) 08/01/2020   Post-menopausal 09/02/2019   Recurrent urticaria 05/20/2018   Vitamin D deficiency 11/20/2017   Mixed hyperlipidemia 11/20/2017   Family history of coronary arteriosclerosis- strong fam h/o CAD and early CAD.  07/17/2017   Raynaud's disease without gangrene 03/12/2017   Eosinophilic esophagitis 10/12/2016   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   Autoimmune disease (HCC) 09/02/2016   Abnormality of gait 05/09/2016   Sjogren's syndrome (HCC) 02/29/2016   GERD (gastroesophageal reflux disease) 01/19/2016    Glucose intolerance (impaired glucose tolerance) 01/19/2016   Chronic diastolic CHF (congestive heart failure) (HCC) 04/20/2015   Peripheral polyneuropathy 02/02/2014   Class 2 obesity due to excess calories with body mass index (BMI) of 36.0 to 36.9 in adult 11/17/2013   Heart murmur    OSA (obstructive sleep apnea)    Rheumatoid arthritis (HCC)    Hiatal hernia    Fibromyalgia    PVC's (premature ventricular contractions)    Unilateral primary osteoarthritis, left knee 06/22/2011    PCP: Melida Quitter, PA  REFERRING PROVIDER: Melida Quitter, PA  REFERRING DIAG: [id number] (ICD-10-CM) - Aftercare for healing traumatic fracture of right femur  THERAPY DIAG:  Physical deconditioning  Other abnormalities of gait and mobility  Rationale for Evaluation and Treatment: Rehabilitation  ONSET DATE: 06/22/22  SUBJECTIVE:   SUBJECTIVE STATEMENT: Feeling pretty good today, has been able to walk better both with and without Rollator. Pt has a NBQC she wanted to start working with.  Sustained a R distal femur fracture requiring ORIF.  Has had both inpatient rehab as well as HHPT.  Her last formal PT session was March 2024.  She developed pressure ulcers on her R heel and buttock.  Her heel has healed and her buttock ulcer is healing.   PERTINENT HISTORY: Aftercare for healing traumatic fracture of right femur -     Methocarbamol; Take 1 tablet (500 mg total) by mouth every 6 (six) hours as needed for muscle spasms.  Dispense: 120 tablet; Refill: 1 -     Ambulatory referral to Physical Therapy   Pain in right leg -     Methocarbamol; Take 1 tablet (500 mg total) by mouth every 6 (six) hours as needed for muscle spasms.  Dispense: 120 tablet; Refill: 1 PAIN:  Are you having pain? Yes: NPRS scale: 7/10 Pain location: global Pain description: ache, sore Aggravating factors: undetermined Relieving factors: medications and indeterminate  PRECAUTIONS: None  RED  FLAGS: None   WEIGHT BEARING RESTRICTIONS: No  FALLS:  Has patient fallen in last 6 months? No  OCCUPATION: retired  PLOF: Independent  PATIENT GOALS: To increase my strength and mobility  NEXT MD VISIT: 3 months  OBJECTIVE:  Note: Objective measures were completed at Evaluation unless otherwise noted.  DIAGNOSTIC FINDINGS:  Narrative & Impression  CLINICAL DATA:  Fracture, postop.   EXAM: PORTABLE RIGHT KNEE - 1-2 VIEW; RIGHT FEMUR PORTABLE 2 VIEW   COMPARISON:  Preoperative imaging.   FINDINGS: Lateral plate and multi screw fixation of distal femur fracture. The fracture is in improved alignment with mild persistent displacement. There is no new periprosthetic lucency. Right knee arthroplasty is in place. Alignment remains congruent. Recent  postsurgical change includes air and edema in the joint space and soft tissues.   IMPRESSION: ORIF distal femur fracture, in improved alignment. No immediate postoperative complication.     Electronically Signed   By: Narda Rutherford M.D.   On: 06/22/2022 15:08    PATIENT SURVEYS:  LEFS 22/80  MUSCLE LENGTH: Hamstrings: WFL for gait and transfers (assessed seated) Thomas test: UTA  POSTURE: deferred  PALPATION: deferred  LOWER EXTREMITY ROM:  Passive ROM Right eval Left eval  Hip flexion    Hip extension    Hip abduction    Hip adduction    Hip internal rotation    Hip external rotation    Knee flexion    Knee extension    Ankle dorsiflexion    Ankle plantarflexion    Ankle inversion    Ankle eversion     (Blank rows = not tested)  LOWER EXTREMITY MMT:  MMT Right eval Left eval  Hip flexion    Hip extension    Hip abduction    Hip adduction    Hip internal rotation    Hip external rotation    Knee flexion    Knee extension    Ankle dorsiflexion    Ankle plantarflexion    Ankle inversion    Ankle eversion     (Blank rows = not tested)  LOWER EXTREMITY SPECIAL TESTS:  mCTSIB 30s at  position 1,2; unable to maintain position 3,4 for >5 s  FUNCTIONAL TESTS:  30 seconds chair stand test 5 reps with UE support  GAIT: Distance walked: 77ftx2  Assistive device utilized: Environmental consultant - 4 wheeled Level of assistance: Complete Independence Comments: slow cadence   [facility] Adult PT Treatment:                                                DATE: 10/16/2023  Therapeutic Exercise: Nustep level 3 x 5 mins Hip 3 way 2x10, 3#  Gait training Walking w/ SPC  15' rest as needed Walking backwards in // no AD hand hovering    [facility] Adult PT Treatment:                                                DATE: 10/09/2023  Therapeutic Exercise: Nustep level 3 x 5 mins Mini squat with UE assist 2x10  Gait training Walking no AD in //, hand hovering Walking backwards in // no AD hand hovering Neuromuscular re-ed Tandem stance  4-way weight shift w/ Stepping strategy training  PATIENT EDUCATION:  Education details: Discussed eval findings, rehab rationale and POC and patient is in agreement  Person educated: Patient Education method: Explanation Education comprehension: verbalized understanding and needs further education  HOME EXERCISE PROGRAM: Access Code: [email or web address] URL: [email or web address] Date: 08/28/2023 Prepared by: Gustavus Bryant  Exercises - Sit to Stand with Counter Support  - 2 x daily - 5 x weekly - 1 sets - 5 reps - Standing Heel Raise with Support  - 2 x daily - 5 x weekly - 2 sets - 10 reps  ASSESSMENT:  CLINICAL IMPRESSION: Pt attended physical therapy session for continuation of treatment regarding deconditioning s/p R femur ORIF. Today's treatment focused on improvement of  Global hip strengthening and ambulation quality using  cane. Pt showed  great tolerance to treatment and demonstrated improvement with ambulation quality and hip strength. Pt required minimal verbal cuing as well as Contact guard assistance for safe and appropriate performance of ambulatory activities. Continue with therapeutic focus on ambulation quality, functional strength, and isolated global hip strength .  Pt attended physical therapy session for continuation of treatment regarding deconditioning s/p R femur ORIF. Today's treatment focused on improvement of building stability in available ROM as well as foundational strengthening to improve gait quality and balance strategies. Pt showed  great tolerance to treatment and demonstrated improvement with movement quality indicating an improvement in hip strength, as well as standing tolerance and stepping strategies. Pt required moderate cuing as well as minimal assistance in standing for safe and appropriate performance of today's interventions. Continue with therapeutic focus on  balance strategies, global hip strength, standing tolerance, and  Gait training with LRAD.   Eval Impression:Patient is a 73 y.o. female who was seen today for physical therapy evaluation and treatment for functional deconditioning and LE weakness following R femur ORIF 06/2022 with complex post-op course. She ambulates with a rollator and requires UE support to stand from chair.  Current neuropathy affects balance and proprioception.  OBJECTIVE IMPAIRMENTS: Abnormal gait, decreased activity tolerance, decreased balance, decreased coordination, decreased endurance, decreased mobility, difficulty walking, decreased strength, and impaired perceived functional ability.   ACTIVITY LIMITATIONS: carrying, lifting, sitting, standing, squatting, stairs, transfers, and locomotion level  PERSONAL FACTORS: Fitness, Past/current experiences, Time since onset of injury/illness/exacerbation, and 3+ comorbidities: RA, neuropathy and open wounds   are also affecting patient's functional outcome.   REHAB POTENTIAL: Good  CLINICAL DECISION MAKING: Stable/uncomplicated  EVALUATION COMPLEXITY: Low   GOALS: Goals reviewed with patient? No  SHORT TERM GOALS: Target date: 09/28/2023   Patient to demonstrate independence in HEP  Baseline: [email or web address] Goal status: INITIAL  2.  Assess for baseline mobility Baseline: TBD Goal status: INITIAL   LONG TERM GOALS: Target date: 10/23/2023  Patient will acknowledge 4/10 pain at least once during episode of care   Baseline: 7/10 Goal status: INITIAL  2.  Patient will increase 30s chair stand reps from 5 to 8 with arms to demonstrate and improved functional ability with less pain/difficulty as well as reduce fall risk.  Baseline: 5 Goal status: INITIAL  3.  Patient will score at least 38% on LEFS to signify clinically meaningful improvement in functional abilities.   Baseline: 28% (22/80) Goal status: INITIAL  4.  Re-assess 2 MWT for progress. Baseline:  Goal status: INITIAL  5.  10s stance time on positions 3,4 on mCTSIB Baseline: <5s each position Goal status: INITIAL    PLAN:  PT FREQUENCY: 1x/week  PT DURATION: 8 weeks  PLANNED INTERVENTIONS: 16109- PT Re-evaluation,  97110-Therapeutic exercises, 97530- Therapeutic activity, [id number]- Neuromuscular re-education, 97535- Self Care, 69629- Manual therapy, [phone]- Gait training, [phone]- Aquatic Therapy, Balance training, Stair training, and Dry Needling  PLAN FOR NEXT SESSION: Continue with therapeutic focus on ambulation quality, functional strength, and isolated global hip strength .  Sheliah Plane, PT, DPT 10/16/2023, 4:11 PM

## 2023-10-17 ENCOUNTER — Encounter: Payer: PPO | Admitting: Physical Therapy

## 2023-10-18 ENCOUNTER — Ambulatory Visit: Payer: PPO | Admitting: Physical Therapy

## 2023-10-18 ENCOUNTER — Encounter: Payer: Self-pay | Admitting: Physical Therapy

## 2023-10-18 DIAGNOSIS — R2689 Other abnormalities of gait and mobility: Secondary | ICD-10-CM

## 2023-10-18 DIAGNOSIS — R5381 Other malaise: Secondary | ICD-10-CM

## 2023-10-18 NOTE — Therapy (Signed)
 OUTPATIENT PHYSICAL THERAPY TREATMENT NOTE  Patient Name: [name] MRN: [id number] DOB:November 16, 1950, 73 y.o., female Today's Date: 10/18/2023  END OF SESSION:  PT End of Session - 10/18/23 1445     Visit Number 5    Number of Visits 8    Date for PT Re-Evaluation 10/26/23                 Past Medical History:  Diagnosis Date   Agatston coronary artery calcium score greater than 400 05/2022   coronary Ca score 856   Back pain    Blood transfusion    1981   Chronic diastolic CHF (congestive heart failure) (HCC)    diastolic    COVID    DDD (degenerative disc disease), cervical    DDD (degenerative disc disease), lumbar    Fibromyalgia    GERD (gastroesophageal reflux disease)    Heart murmur    as a child   Hiatal hernia    sjorgens syndrome   High blood pressure    High cholesterol    IBS (irritable bowel syndrome)    OSA (obstructive sleep apnea)    Osteoarthritis    Peripheral autonomic neuropathy of unknown cause    Pre-diabetes    PVC (premature ventricular contraction)    Raynaud disease    Rheumatoid arthritis (HCC)    Sjogren's disease (HCC)    Urticaria    Vitamin D deficiency    Past Surgical History:  Procedure Laterality Date   ABDOMINAL HYSTERECTOMY     BTL, BSO   ADENOIDECTOMY     BREAST EXCISIONAL BIOPSY Left    BREAST SURGERY     mass removal    CHOLECYSTECTOMY     dental implants     DIAGNOSTIC LAPAROSCOPY     x3   FEMUR FRACTURE SURGERY Right    KNEE ARTHROSCOPY     x2   MASS EXCISION Left 03/22/2017   Procedure: EXCISION OF LEFT BREAST MASS;  Surgeon: Manus Rudd, MD;  Location: [facility];  Service: General;  Laterality: Left;   MULTIPLE TOOTH EXTRACTIONS  06/2023   x5   ORIF FEMUR FRACTURE Right 06/22/2022   Procedure: RIGHT OPEN REDUCTION INTERNAL FIXATION (ORIF) DISTAL FEMUR FRACTURE;  Surgeon: Myrene Galas, MD;  Location: [facility];  Service: Orthopedics;  Laterality: Right;   patotid cystectomy     RIGHT/LEFT HEART  CATH AND CORONARY ANGIOGRAPHY N/A 07/25/2019   Procedure: RIGHT/LEFT HEART CATH AND CORONARY ANGIOGRAPHY;  Surgeon: Dolores Patty, MD;  Location: [facility];  Service: Cardiovascular;  Laterality: N/A;   TENDON REPAIR  1980   left ankle and tibia   TONSILLECTOMY     TOTAL KNEE ARTHROPLASTY  06/21/2011   Procedure: TOTAL KNEE ARTHROPLASTY;  Surgeon: Loanne Drilling;  Location: [facility];  Service: Orthopedics;  Laterality: Right;   TUBAL LIGATION     Patient Active Problem List   Diagnosis Date Noted   Sacral ulcer (HCC) 07/30/2023   IDA (iron deficiency anemia) 06/05/2023   Normocytic normochromic anemia 06/05/2023   Normocytic anemia 06/01/2023   Situational anxiety 10/15/2022   Pressure sore of left ischial area, stage IV (HCC) 09/10/2022   Pain in right leg 09/10/2022   Aftercare for healing traumatic fracture of right femur 06/21/2022   DNR (do not resuscitate) 06/21/2022   Agatston coronary artery calcium score greater than 400 05/25/2022   Laryngopharyngeal reflux (LPR) 10/10/2021   Chronic maxillary sinusitis 10/10/2021   Prediabetes 02/14/2021   Muscle spasm  of back 08/01/2020   Microalbuminuria 08/01/2020   Chronic kidney disease, stage 3b (HCC) 08/01/2020   Post-menopausal 09/02/2019   Recurrent urticaria 05/20/2018   Vitamin D deficiency 11/20/2017   Mixed hyperlipidemia 11/20/2017   Family history of coronary arteriosclerosis- strong fam h/o CAD and early CAD.  07/17/2017   Raynaud's disease without gangrene 03/12/2017   Eosinophilic esophagitis 10/12/2016   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   Autoimmune disease (HCC) 09/02/2016   Abnormality of gait 05/09/2016   Sjogren's syndrome (HCC) 02/29/2016   GERD (gastroesophageal reflux disease) 01/19/2016   Glucose intolerance (impaired glucose tolerance) 01/19/2016   Chronic diastolic CHF (congestive heart failure) (HCC) 04/20/2015   Peripheral polyneuropathy 02/02/2014   Class 2 obesity due to  excess calories with body mass index (BMI) of 36.0 to 36.9 in adult 11/17/2013   Heart murmur    OSA (obstructive sleep apnea)    Rheumatoid arthritis (HCC)    Hiatal hernia    Fibromyalgia    PVC's (premature ventricular contractions)    Unilateral primary osteoarthritis, left knee 06/22/2011    PCP: Melida Quitter, PA  REFERRING PROVIDER: Melida Quitter, PA  REFERRING DIAG: [phone] (ICD-10-CM) - Aftercare for healing traumatic fracture of right femur  THERAPY DIAG:  Physical deconditioning  Other abnormalities of gait and mobility  Rationale for Evaluation and Treatment: Rehabilitation  ONSET DATE: 06/22/22  SUBJECTIVE:   SUBJECTIVE STATEMENT: Pt stated that she is feeling a little sore today from last session, but feeling good overall. Feeling good with her cane on flat, uncluttered floor walking.  Eval statement: Sustained a R distal femur fracture requiring ORIF.  Has had both inpatient rehab as well as HHPT.  Her last formal PT session was March 2024.  She developed pressure ulcers on her R heel and buttock.  Her heel has healed and her buttock ulcer is healing.   PERTINENT HISTORY: Aftercare for healing traumatic fracture of right femur -     Methocarbamol; Take 1 tablet (500 mg total) by mouth every 6 (six) hours as needed for muscle spasms.  Dispense: 120 tablet; Refill: 1 -     Ambulatory referral to Physical Therapy   Pain in right leg -     Methocarbamol; Take 1 tablet (500 mg total) by mouth every 6 (six) hours as needed for muscle spasms.  Dispense: 120 tablet; Refill: 1 PAIN:  Are you having pain? Yes: NPRS scale: 7/10 Pain location: global Pain description: ache, sore Aggravating factors: undetermined Relieving factors: medications and indeterminate  PRECAUTIONS: None  RED FLAGS: None   WEIGHT BEARING RESTRICTIONS: No  FALLS:  Has patient fallen in last 6 months? No  OCCUPATION: retired  PLOF: Independent  PATIENT GOALS: To increase my  strength and mobility  NEXT MD VISIT: 3 months  OBJECTIVE:  Note: Objective measures were completed at Evaluation unless otherwise noted.  DIAGNOSTIC FINDINGS:  Narrative & Impression  CLINICAL DATA:  Fracture, postop.   EXAM: PORTABLE RIGHT KNEE - 1-2 VIEW; RIGHT FEMUR PORTABLE 2 VIEW   COMPARISON:  Preoperative imaging.   FINDINGS: Lateral plate and multi screw fixation of distal femur fracture. The fracture is in improved alignment with mild persistent displacement. There is no new periprosthetic lucency. Right knee arthroplasty is in place. Alignment remains congruent. Recent postsurgical change includes air and edema in the joint space and soft tissues.   IMPRESSION: ORIF distal femur fracture, in improved alignment. No immediate postoperative complication.     Electronically Signed   By: Shawna Orleans  Sanford M.D.   On: 06/22/2022 15:08    PATIENT SURVEYS:  LEFS 22/80  MUSCLE LENGTH: Hamstrings: WFL for gait and transfers (assessed seated) Thomas test: UTA  POSTURE: deferred  PALPATION: deferred  LOWER EXTREMITY ROM:  Passive ROM Right eval Left eval  Hip flexion    Hip extension    Hip abduction    Hip adduction    Hip internal rotation    Hip external rotation    Knee flexion    Knee extension    Ankle dorsiflexion    Ankle plantarflexion    Ankle inversion    Ankle eversion     (Blank rows = not tested)  LOWER EXTREMITY MMT:  MMT Right eval Left eval  Hip flexion    Hip extension    Hip abduction    Hip adduction    Hip internal rotation    Hip external rotation    Knee flexion    Knee extension    Ankle dorsiflexion    Ankle plantarflexion    Ankle inversion    Ankle eversion     (Blank rows = not tested)  LOWER EXTREMITY SPECIAL TESTS:  mCTSIB 30s at position 1,2; unable to maintain position 3,4 for >5 s  FUNCTIONAL TESTS:  30 seconds chair stand test 5 reps with UE support  GAIT: Distance walked: 48ftx2  Assistive  device utilized: Environmental consultant - 4 wheeled Level of assistance: Complete Independence Comments: slow cadence   [facility] Adult PT Treatment:                                                DATE: 10/18/2023  Gait training Walking w/NBQC Compliant surface training Technique education with AD Stair ambulation training  [facility] Adult PT Treatment:                                                DATE: 10/16/2023  Therapeutic Exercise: Nustep level 3 x 5 mins Hip 3 way 2x10, 3#  Gait training Walking w/ SPC  15' rest as needed Walking backwards in // no AD hand hovering  PATIENT EDUCATION:  Education details: Discussed eval findings, rehab rationale and POC and patient is in agreement  Person educated: Patient Education method: Explanation Education comprehension: verbalized understanding and needs further education  HOME EXERCISE PROGRAM: Access Code: [email or web address] URL: [email or web address] Date: 08/28/2023 Prepared by: Gustavus Bryant  Exercises - Sit to Stand with Counter Support  - 2 x daily - 5 x weekly - 1 sets - 5 reps - Standing Heel Raise with Support  - 2 x daily - 5 x weekly - 2 sets - 10 reps  ASSESSMENT:  CLINICAL IMPRESSION: Pt attended physical therapy session for continuation of treatment regarding deconditioning s/p R femur ORIF. Today's treatment focused on improvement of  Global hip strengthening and ambulation quality using cane. Pt showed  great tolerance to treatment and demonstrated improvement with ambulation quality and hip strength. Pt required minimal verbal cuing as well as Contact guard assistance for safe and appropriate performance of ambulatory activities. Continue with therapeutic focus on ambulation quality, functional strength, and  isolated global hip strength .  Eval Impression:Patient is a 73 y.o. female who was seen today for physical therapy evaluation and treatment for functional deconditioning and LE weakness following R femur ORIF 06/2022 with complex post-op course. She ambulates with a rollator and requires UE support to stand from chair.  Current neuropathy affects balance and proprioception.  OBJECTIVE IMPAIRMENTS: Abnormal gait, decreased activity tolerance, decreased balance, decreased coordination, decreased endurance, decreased mobility, difficulty walking, decreased strength, and impaired perceived functional ability.   ACTIVITY LIMITATIONS: carrying, lifting, sitting, standing, squatting, stairs, transfers, and locomotion level  PERSONAL FACTORS: Fitness, Past/current experiences, Time since onset of injury/illness/exacerbation, and 3+ comorbidities: RA, neuropathy and open wounds  are also affecting patient's functional outcome.   REHAB POTENTIAL: Good  CLINICAL DECISION MAKING: Stable/uncomplicated  EVALUATION COMPLEXITY: Low   GOALS: Goals reviewed with patient? No  SHORT TERM GOALS: Target date: 09/28/2023   Patient to demonstrate independence in HEP  Baseline: [email or web address] Goal status: INITIAL  2.  Assess for baseline mobility Baseline: TBD Goal status: INITIAL   LONG TERM GOALS: Target date: 10/23/2023  Patient will acknowledge 4/10 pain at least once during episode of care   Baseline: 7/10 Goal status: INITIAL  2.  Patient will increase 30s chair stand reps from 5 to 8 with arms to demonstrate and improved functional ability with less pain/difficulty as well as reduce fall risk.  Baseline: 5 Goal status: INITIAL  3.  Patient will score at least 38% on LEFS to signify clinically meaningful improvement in functional abilities.   Baseline: 28% (22/80) Goal status: INITIAL  4.  Re-assess 2 MWT for progress. Baseline:  Goal status: INITIAL  5.  10s stance time on positions 3,4  on mCTSIB Baseline: <5s each position Goal status: INITIAL    PLAN:  PT FREQUENCY: 1x/week  PT DURATION: 8 weeks  PLANNED INTERVENTIONS: 97164- PT Re-evaluation, 97110-Therapeutic exercises, 97530- Therapeutic activity, [id number]- Neuromuscular re-education, 97535- Self Care, 96045- Manual therapy, [id number]- Gait training, [phone]- Aquatic Therapy, Balance training, Stair training, and Dry Needling  PLAN FOR NEXT SESSION: Continue with therapeutic focus on ambulation quality, functional strength, and isolated global hip strength .  Sheliah Plane, PT, DPT 10/18/2023, 5:30 PM

## 2023-10-22 ENCOUNTER — Other Ambulatory Visit: Payer: Self-pay | Admitting: Family Medicine

## 2023-10-22 ENCOUNTER — Other Ambulatory Visit: Payer: Self-pay | Admitting: Neurology

## 2023-10-22 DIAGNOSIS — Z1231 Encounter for screening mammogram for malignant neoplasm of breast: Secondary | ICD-10-CM

## 2023-10-23 ENCOUNTER — Other Ambulatory Visit: Payer: PPO

## 2023-10-23 DIAGNOSIS — E782 Mixed hyperlipidemia: Secondary | ICD-10-CM | POA: Diagnosis not present

## 2023-10-23 DIAGNOSIS — R7303 Prediabetes: Secondary | ICD-10-CM

## 2023-10-23 DIAGNOSIS — M797 Fibromyalgia: Secondary | ICD-10-CM

## 2023-10-23 DIAGNOSIS — F418 Other specified anxiety disorders: Secondary | ICD-10-CM | POA: Diagnosis not present

## 2023-10-24 ENCOUNTER — Ambulatory Visit

## 2023-10-24 ENCOUNTER — Ambulatory Visit: Payer: PPO | Admitting: Physical Therapy

## 2023-10-24 ENCOUNTER — Encounter

## 2023-10-24 ENCOUNTER — Encounter: Payer: Self-pay | Admitting: Physical Therapy

## 2023-10-24 DIAGNOSIS — R2689 Other abnormalities of gait and mobility: Secondary | ICD-10-CM

## 2023-10-24 DIAGNOSIS — R5381 Other malaise: Secondary | ICD-10-CM | POA: Diagnosis not present

## 2023-10-24 LAB — COMPREHENSIVE METABOLIC PANEL
ALT: 9 IU/L (ref 0–32)
AST: 15 IU/L (ref 0–40)
Albumin: 4.1 g/dL (ref 3.8–4.8)
Alkaline Phosphatase: 117 IU/L (ref 44–121)
BUN/Creatinine Ratio: 26 (ref 12–28)
BUN: 36 mg/dL — ABNORMAL HIGH (ref 8–27)
Bilirubin Total: 0.3 mg/dL (ref 0.0–1.2)
CO2: 19 mmol/L — ABNORMAL LOW (ref 20–29)
Calcium: 9.5 mg/dL (ref 8.7–10.3)
Chloride: 102 mmol/L (ref 96–106)
Creatinine, Ser: 1.36 mg/dL — ABNORMAL HIGH (ref 0.57–1.00)
Globulin, Total: 3.1 g/dL (ref 1.5–4.5)
Glucose: 95 mg/dL (ref 70–99)
Potassium: 4.8 mmol/L (ref 3.5–5.2)
Sodium: 138 mmol/L (ref 134–144)
Total Protein: 7.2 g/dL (ref 6.0–8.5)
eGFR: 42 mL/min/{1.73_m2} — ABNORMAL LOW (ref >59)

## 2023-10-24 LAB — CBC WITH DIFFERENTIAL/PLATELET
Basophils Absolute: 0.1 10*3/uL (ref 0.0–0.2)
Basos: 1 %
EOS (ABSOLUTE): 0.2 10*3/uL (ref 0.0–0.4)
Eos: 3 %
Hematocrit: 38.1 % (ref 34.0–46.6)
Hemoglobin: 12.3 g/dL (ref 11.1–15.9)
Immature Grans (Abs): 0.1 10*3/uL (ref 0.0–0.1)
Immature Granulocytes: 1 %
Lymphocytes Absolute: 1.5 10*3/uL (ref 0.7–3.1)
Lymphs: 22 %
MCH: 27.4 pg (ref 26.6–33.0)
MCHC: 32.3 g/dL (ref 31.5–35.7)
MCV: 85 fL (ref 79–97)
Monocytes Absolute: 0.6 10*3/uL (ref 0.1–0.9)
Monocytes: 8 %
Neutrophils Absolute: 4.4 10*3/uL (ref 1.4–7.0)
Neutrophils: 65 %
Platelets: 281 10*3/uL (ref 150–450)
RBC: 4.49 x10E6/uL (ref 3.77–5.28)
RDW: 14.7 % (ref 11.7–15.4)
WBC: 6.9 10*3/uL (ref 3.4–10.8)

## 2023-10-24 LAB — LIPID PANEL
Chol/HDL Ratio: 2 ratio (ref 0.0–4.4)
Cholesterol, Total: 120 mg/dL (ref 100–199)
HDL: 60 mg/dL (ref >39)
LDL Chol Calc (NIH): 38 mg/dL (ref 0–99)
Triglycerides: 125 mg/dL (ref 0–149)
VLDL Cholesterol Cal: 22 mg/dL (ref 5–40)

## 2023-10-24 LAB — HEMOGLOBIN A1C
Est. average glucose Bld gHb Est-mCnc: 114 mg/dL
Hgb A1c MFr Bld: 5.6 % (ref 4.8–5.6)

## 2023-10-24 NOTE — Therapy (Signed)
 OUTPATIENT PHYSICAL THERAPY TREATMENT & PROGRESS NOTE  Patient Name: [name] MRN: [id number] DOB:1951-07-13, 73 y.o., female Today's Date: 10/24/2023  END OF SESSION:  PT End of Session - 10/24/23 1530     Visit Number 6    Number of Visits 8    Date for PT Re-Evaluation 10/26/23    PT Start Time 1530    PT Stop Time 1615    PT Time Calculation (min) 45 min                  Past Medical History:  Diagnosis Date   Agatston coronary artery calcium score greater than 400 05/2022   coronary Ca score 856   Back pain    Blood transfusion    1981   Chronic diastolic CHF (congestive heart failure) (HCC)    diastolic    COVID    DDD (degenerative disc disease), cervical    DDD (degenerative disc disease), lumbar    Fibromyalgia    GERD (gastroesophageal reflux disease)    Heart murmur    as a child   Hiatal hernia    sjorgens syndrome   High blood pressure    High cholesterol    IBS (irritable bowel syndrome)    OSA (obstructive sleep apnea)    Osteoarthritis    Peripheral autonomic neuropathy of unknown cause    Pre-diabetes    PVC (premature ventricular contraction)    Raynaud disease    Rheumatoid arthritis (HCC)    Sjogren's disease (HCC)    Urticaria    Vitamin D deficiency    Past Surgical History:  Procedure Laterality Date   ABDOMINAL HYSTERECTOMY     BTL, BSO   ADENOIDECTOMY     BREAST EXCISIONAL BIOPSY Left    BREAST SURGERY     mass removal    CHOLECYSTECTOMY     dental implants     DIAGNOSTIC LAPAROSCOPY     x3   FEMUR FRACTURE SURGERY Right    KNEE ARTHROSCOPY     x2   MASS EXCISION Left 03/22/2017   Procedure: EXCISION OF LEFT BREAST MASS;  Surgeon: Manus Rudd, MD;  Location: [facility];  Service: General;  Laterality: Left;   MULTIPLE TOOTH EXTRACTIONS  06/2023   x5   ORIF FEMUR FRACTURE Right 06/22/2022   Procedure: RIGHT OPEN REDUCTION INTERNAL FIXATION (ORIF) DISTAL FEMUR FRACTURE;  Surgeon: Myrene Galas, MD;  Location:  [facility];  Service: Orthopedics;  Laterality: Right;   patotid cystectomy     RIGHT/LEFT HEART CATH AND CORONARY ANGIOGRAPHY N/A 07/25/2019   Procedure: RIGHT/LEFT HEART CATH AND CORONARY ANGIOGRAPHY;  Surgeon: Dolores Patty, MD;  Location: [facility];  Service: Cardiovascular;  Laterality: N/A;   TENDON REPAIR  1980   left ankle and tibia   TONSILLECTOMY     TOTAL KNEE ARTHROPLASTY  06/21/2011   Procedure: TOTAL KNEE ARTHROPLASTY;  Surgeon: Loanne Drilling;  Location: [facility];  Service: Orthopedics;  Laterality: Right;   TUBAL LIGATION     Patient Active Problem List   Diagnosis Date Noted   Sacral ulcer (HCC) 07/30/2023   IDA (iron deficiency anemia) 06/05/2023   Normocytic normochromic anemia 06/05/2023   Normocytic anemia 06/01/2023   Situational anxiety 10/15/2022   Pressure sore of left ischial area, stage IV (HCC) 09/10/2022   Pain in right leg 09/10/2022   Aftercare for healing traumatic fracture of right femur 06/21/2022   DNR (do not resuscitate) 06/21/2022   Agatston coronary artery  calcium score greater than 400 05/25/2022   Laryngopharyngeal reflux (LPR) 10/10/2021   Chronic maxillary sinusitis 10/10/2021   Prediabetes 02/14/2021   Muscle spasm of back 08/01/2020   Microalbuminuria 08/01/2020   Chronic kidney disease, stage 3b (HCC) 08/01/2020   Post-menopausal 09/02/2019   Recurrent urticaria 05/20/2018   Vitamin D deficiency 11/20/2017   Mixed hyperlipidemia 11/20/2017   Family history of coronary arteriosclerosis- strong fam h/o CAD and early CAD.  07/17/2017   Raynaud's disease without gangrene 03/12/2017   Eosinophilic esophagitis 10/12/2016   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   Autoimmune disease (HCC) 09/02/2016   Abnormality of gait 05/09/2016   Sjogren's syndrome (HCC) 02/29/2016   GERD (gastroesophageal reflux disease) 01/19/2016   Glucose intolerance (impaired glucose tolerance) 01/19/2016   Chronic diastolic CHF (congestive heart  failure) (HCC) 04/20/2015   Peripheral polyneuropathy 02/02/2014   Class 2 obesity due to excess calories with body mass index (BMI) of 36.0 to 36.9 in adult 11/17/2013   Heart murmur    OSA (obstructive sleep apnea)    Rheumatoid arthritis (HCC)    Hiatal hernia    Fibromyalgia    PVC's (premature ventricular contractions)    Unilateral primary osteoarthritis, left knee 06/22/2011    PCP: Melida Quitter, PA  REFERRING PROVIDER: Melida Quitter, PA  REFERRING DIAG: [phone] (ICD-10-CM) - Aftercare for healing traumatic fracture of right femur  THERAPY DIAG:  Physical deconditioning  Other abnormalities of gait and mobility  Rationale for Evaluation and Treatment: Rehabilitation  ONSET DATE: 06/22/22  SUBJECTIVE:   SUBJECTIVE STATEMENT: Pt stated that she feels like she is doing much better, wants to continue working on community ambulation and balance so she can start to travel comfortably.  Eval statement: Sustained a R distal femur fracture requiring ORIF.  Has had both inpatient rehab as well as HHPT.  Her last formal PT session was March 2024.  She developed pressure ulcers on her R heel and buttock.  Her heel has healed and her buttock ulcer is healing.   PERTINENT HISTORY: Aftercare for healing traumatic fracture of right femur -     Methocarbamol; Take 1 tablet (500 mg total) by mouth every 6 (six) hours as needed for muscle spasms.  Dispense: 120 tablet; Refill: 1 -     Ambulatory referral to Physical Therapy   Pain in right leg -     Methocarbamol; Take 1 tablet (500 mg total) by mouth every 6 (six) hours as needed for muscle spasms.  Dispense: 120 tablet; Refill: 1 PAIN:  Are you having pain? Yes: NPRS scale: 7/10 Pain location: global Pain description: ache, sore Aggravating factors: undetermined Relieving factors: medications and indeterminate  PRECAUTIONS: None  RED FLAGS: None   WEIGHT BEARING RESTRICTIONS: No  FALLS:  Has patient fallen in  last 6 months? No  OCCUPATION: retired  PLOF: Independent  PATIENT GOALS: To increase my strength and mobility  NEXT MD VISIT: 3 months  OBJECTIVE:  Note: Objective measures were completed at Evaluation unless otherwise noted.  DIAGNOSTIC FINDINGS:  Narrative & Impression  CLINICAL DATA:  Fracture, postop.   EXAM: PORTABLE RIGHT KNEE - 1-2 VIEW; RIGHT FEMUR PORTABLE 2 VIEW   COMPARISON:  Preoperative imaging.   FINDINGS: Lateral plate and multi screw fixation of distal femur fracture. The fracture is in improved alignment with mild persistent displacement. There is no new periprosthetic lucency. Right knee arthroplasty is in place. Alignment remains congruent. Recent postsurgical change includes air and edema in the joint space and  soft tissues.   IMPRESSION: ORIF distal femur fracture, in improved alignment. No immediate postoperative complication.     Electronically Signed   By: Narda Rutherford M.D.   On: 06/22/2022 15:08    PATIENT SURVEYS:  LEFS 22/80 10/24/2023: 43/80  MUSCLE LENGTH: Hamstrings: WFL for gait and transfers (assessed seated) Maisie Fus test: UTA  POSTURE: deferred  PALPATION: deferred  LOWER EXTREMITY ROM:  Passive ROM Right eval Left eval  Hip flexion    Hip extension    Hip abduction    Hip adduction    Hip internal rotation    Hip external rotation    Knee flexion    Knee extension    Ankle dorsiflexion    Ankle plantarflexion    Ankle inversion    Ankle eversion     (Blank rows = not tested)  LOWER EXTREMITY MMT:  MMT Right eval Left eval  Hip flexion    Hip extension    Hip abduction    Hip adduction    Hip internal rotation    Hip external rotation    Knee flexion    Knee extension    Ankle dorsiflexion    Ankle plantarflexion    Ankle inversion    Ankle eversion     (Blank rows = not tested)  LOWER EXTREMITY SPECIAL TESTS:  mCTSIB 30s at position 1,2; unable to maintain position 3,4 for >5 s 10/24/2023:  maintained 20s at position 3 and 4  FUNCTIONAL TESTS:  30 seconds chair stand test 5 reps with UE support 10/24/2023: 12  GAIT: Distance walked: 10ftx2  Assistive device utilized: Environmental consultant - 4 wheeled Level of assistance: Complete Independence Comments: slow cadence   [facility] Adult PT Treatment:                                                DATE: 10/24/2023 Therapeutic Activity: Re-evaluative measures  Self Care: POC discussion Pt education   [facility] Adult PT Treatment:                                                DATE: 10/18/2023  Gait training Walking w/NBQC Compliant surface training Technique education with AD Stair ambulation training  [facility] Adult PT Treatment:                                                DATE: 10/16/2023  Therapeutic Exercise: Nustep level 3 x 5 mins Hip 3 way 2x10, 3#  Gait training Walking w/ SPC  15' rest as needed Walking backwards in // no AD hand hovering  PATIENT EDUCATION:  Education details: Discussed eval findings, rehab rationale and POC and patient is in agreement  Person educated: Patient Education method: Explanation Education comprehension: verbalized understanding and needs further education  HOME EXERCISE PROGRAM: Access Code: [email or web address] URL: [email or web address] Date: 08/28/2023 Prepared by: Gustavus Bryant  Exercises - Sit to Stand with Counter Support  - 2 x daily - 5 x weekly - 1 sets - 5 reps - Standing Heel Raise with Support  - 2 x daily - 5 x weekly - 2 sets - 10 reps  ASSESSMENT:  CLINICAL IMPRESSION: Pt attended physical therapy session for re-evaluation of deconditioning s/p R femur ORIF in 2023. Pt has made significant progress in all areas of current POC. Pt has met all therapeutic  goals and agrees that she would benfit from further therapeutic focus on ambulation and balance.  Pt required minimal cuing as well as contact guard assistance for safe and appropriate performance of today's activities.    Eval Impression:Patient is a 73 y.o. female who was seen today for physical therapy evaluation and treatment for functional deconditioning and LE weakness following R femur ORIF 06/2022 with complex post-op course. She ambulates with a rollator and requires UE support to stand from chair.  Current neuropathy affects balance and proprioception.  OBJECTIVE IMPAIRMENTS: Abnormal gait, decreased activity tolerance, decreased balance, decreased coordination, decreased endurance, decreased mobility, difficulty walking, decreased strength, and impaired perceived functional ability.   ACTIVITY LIMITATIONS: carrying, lifting, sitting, standing, squatting, stairs, transfers, and locomotion level  PERSONAL FACTORS: Fitness, Past/current experiences, Time since onset of injury/illness/exacerbation, and 3+ comorbidities: RA, neuropathy and open wounds  are also affecting patient's functional outcome.   REHAB POTENTIAL: Good  CLINICAL DECISION MAKING: Stable/uncomplicated  EVALUATION COMPLEXITY: Low   GOALS: Goals reviewed with patient? No  SHORT TERM GOALS: Target date: 09/28/2023   Patient to demonstrate independence in HEP  Baseline: [email or web address] Goal status: MET 10/24/2023   LONG TERM GOALS: Target date: 12/05/2023  Patient will acknowledge 4/10 pain at least once during episode of care   Baseline: 7/10 Goal status: MET 10/24/2023  (4/10)  2.  Patient will increase 30s chair stand reps from 5 to 8 with arms to demonstrate and improved functional ability with less pain/difficulty as well as reduce fall risk.  Baseline: 5 10/24/2023: 12 Goal status: MET 4/10  3.  Patient will score at least 38% on LEFS to signify clinically meaningful improvement in functional abilities.    Baseline: 28% (22/80) Goal status: MET 10/24/2023 (43/80; 53.75%)   5.  10s stance time on positions 3,4 on mCTSIB Baseline: <5s each position Goal status: MET 10/24/2023  6. Patient will report ability to ambulate for 30 minutes with LRAD with no LOB to demonstrate improved ambulation tolerance necessary for community ambulation.  Baseline: 10 minutes  Goal status Initial 10/24/2023    PLAN:  PT FREQUENCY: 1x/week  PT DURATION: 8 weeks  PLANNED INTERVENTIONS: 97164- PT Re-evaluation, 97110-Therapeutic exercises, 97530- Therapeutic activity, 97112- Neuromuscular re-education, 97535- Self Care, 29562- Manual therapy, [phone]- Gait training, [phone]- Aquatic Therapy, Balance training, Stair training, and Dry Needling  PLAN FOR NEXT SESSION: Continue with therapeutic focus on ambulation quality, functional strength, and isolated global hip strength .  Sheliah Plane, PT, DPT 10/24/2023, 5:14 PM

## 2023-10-25 NOTE — Progress Notes (Unsigned)
 Office Visit Note  Patient: [name]             Date of Birth: 1951-02-13           MRN: [id number]             PCP: Sandre Kitty, MD Referring: Melida Quitter, PA Visit Date: 11/08/2023 Occupation: @GUAROCC @  Subjective:  Right knee pain and swelling   History of Present Illness: [name] is a 73 y.o. female with history of rheumatoid arthritis.  Patient remains on  Plaquenil 200 mg 1 tablet by mouth by mouth daily.  She has been tolerating Plaquenil without any side effects and has not had any recent gaps in therapy.  Patient states it has been difficult to notice if there has been significant clinical benefit since reinitiating Plaquenil since she has had increased pain and swelling in her right knee replacement.  Patient states that she restarted physical therapy to try to improve her balance starting about 7 weeks ago.  Unfortunately she is unable to get in for an appointment with Dr. Antony Odea until May 2025.  Patient states that her balance has been improving but she continues to have significant pain and inflammation in the right knee.  She has been having to take ibuprofen 800 mg twice a day to alleviate her discomfort.  She is currently using a rollator walker to assist with ambulation. Patient was cleared by wound care on 10/10/2023 and has not had any recurrence of the ulcer.    Activities of Daily Living:  Patient reports morning stiffness for 20-30 minutes.   Patient Reports nocturnal pain.  Difficulty dressing/grooming: Denies Difficulty climbing stairs: Reports Difficulty getting out of chair: Reports Difficulty using hands for taps, buttons, cutlery, and/or writing: Reports  Review of Systems  Constitutional:  Positive for fatigue.  HENT:  Positive for mouth dryness. Negative for mouth sores and nose dryness.   Eyes:  Positive for dryness. Negative for pain.  Respiratory:  Negative for shortness of breath and difficulty breathing.   Cardiovascular:   Positive for palpitations. Negative for chest pain.  Gastrointestinal:  Negative for blood in stool, constipation and diarrhea.  Endocrine: Negative for increased urination.  Genitourinary:  Positive for involuntary urination.  Musculoskeletal:  Positive for joint pain, gait problem, joint pain, joint swelling, muscle weakness and morning stiffness. Negative for myalgias, muscle tenderness and myalgias.  Skin:  Positive for color change and sensitivity to sunlight. Negative for rash and hair loss.  Allergic/Immunologic: Negative for susceptible to infections.  Neurological:  Positive for numbness. Negative for dizziness and headaches.  Hematological:  Positive for swollen glands.  Psychiatric/Behavioral:  Negative for depressed mood and sleep disturbance. The patient is not nervous/anxious.     PMFS History:  Patient Active Problem List   Diagnosis Date Noted   IBS (irritable bowel syndrome) 11/02/2023   IDA (iron deficiency anemia) 06/05/2023   Normocytic normochromic anemia 06/05/2023   Normocytic anemia 06/01/2023   Situational anxiety 10/15/2022   Pressure sore of left ischial area, stage IV (HCC) 09/10/2022   DNR (do not resuscitate) 06/21/2022   Agatston coronary artery calcium score greater than 400 05/25/2022   Laryngopharyngeal reflux (LPR) 10/10/2021   Chronic maxillary sinusitis 10/10/2021   Prediabetes 02/14/2021   Chronic kidney disease, stage 3b (HCC) 08/01/2020   Post-menopausal 09/02/2019   Recurrent urticaria 05/20/2018   Vitamin D deficiency 11/20/2017   Mixed hyperlipidemia 11/20/2017   Raynaud's disease without gangrene 03/12/2017   Eosinophilic  esophagitis 10/12/2016   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   Autoimmune disease (HCC) 09/02/2016   Abnormality of gait 05/09/2016   Sjogren's syndrome (HCC) 02/29/2016   GERD (gastroesophageal reflux disease) 01/19/2016   Glucose intolerance (impaired glucose tolerance) 01/19/2016   Chronic diastolic CHF  (congestive heart failure) (HCC) 04/20/2015   Peripheral neuropathy 02/02/2014   Class 2 obesity due to excess calories with body mass index (BMI) of 36.0 to 36.9 in adult 11/17/2013   Heart murmur    OSA (obstructive sleep apnea)    Rheumatoid arthritis (HCC)    Hiatal hernia    Fibromyalgia    PVC's (premature ventricular contractions)    Unilateral primary osteoarthritis, left knee 06/22/2011    Past Medical History:  Diagnosis Date   Agatston coronary artery calcium score greater than 400 05/2022   coronary Ca score 856   Back pain    Blood transfusion    1981   Chronic diastolic CHF (congestive heart failure) (HCC)    diastolic    COVID    DDD (degenerative disc disease), cervical    DDD (degenerative disc disease), lumbar    Family history of coronary arteriosclerosis- strong fam h/o CAD and early CAD.  07/17/2017   Fibromyalgia    GERD (gastroesophageal reflux disease)    Heart murmur    as a child   Hiatal hernia    sjorgens syndrome   High blood pressure    High cholesterol    IBS (irritable bowel syndrome)    OSA (obstructive sleep apnea)    Osteoarthritis    Peripheral autonomic neuropathy of unknown cause    Pre-diabetes    PVC (premature ventricular contraction)    Raynaud disease    Rheumatoid arthritis (HCC)    Sjogren's disease (HCC)    Urticaria    Vitamin D deficiency     Family History  Problem Relation Age of Onset   Alzheimer's disease Mother    Heart attack Mother    Hypertension Mother    Glaucoma Mother    High Cholesterol Mother    Heart disease Mother    Depression Mother    Cancer Father    High Cholesterol Father    Heart disease Father    Sleep apnea Father    Breast cancer Maternal Aunt    Heart attack Brother    Heart disease Brother    Glaucoma Brother    Hyperlipidemia Brother    Glaucoma Brother    Asthma Son    Allergic rhinitis Neg Hx    Angioedema Neg Hx    Eczema Neg Hx    Urticaria Neg Hx    Neuropathy Neg Hx     Past Surgical History:  Procedure Laterality Date   ABDOMINAL HYSTERECTOMY     BTL, BSO   ADENOIDECTOMY     BREAST EXCISIONAL BIOPSY Left    BREAST SURGERY     mass removal    CHOLECYSTECTOMY     dental implants     DIAGNOSTIC LAPAROSCOPY     x3   FEMUR FRACTURE SURGERY Right    KNEE ARTHROSCOPY     x2   MASS EXCISION Left 03/22/2017   Procedure: EXCISION OF LEFT BREAST MASS;  Surgeon: Manus Rudd, MD;  Location: [facility];  Service: General;  Laterality: Left;   MULTIPLE TOOTH EXTRACTIONS  06/2023   x5   ORIF FEMUR FRACTURE Right 06/22/2022   Procedure: RIGHT OPEN REDUCTION INTERNAL FIXATION (ORIF) DISTAL FEMUR FRACTURE;  Surgeon: Myrene Galas,  MD;  Location: [facility];  Service: Orthopedics;  Laterality: Right;   patotid cystectomy     RIGHT/LEFT HEART CATH AND CORONARY ANGIOGRAPHY N/A 07/25/2019   Procedure: RIGHT/LEFT HEART CATH AND CORONARY ANGIOGRAPHY;  Surgeon: Dolores Patty, MD;  Location: [facility];  Service: Cardiovascular;  Laterality: N/A;   TENDON REPAIR  1980   left ankle and tibia   TONSILLECTOMY     TOTAL KNEE ARTHROPLASTY  06/21/2011   Procedure: TOTAL KNEE ARTHROPLASTY;  Surgeon: Loanne Drilling;  Location: [facility];  Service: Orthopedics;  Laterality: Right;   TUBAL LIGATION     Social History   Social History Narrative   Lives at home alone   Right-handed   Drinks 1 or less cups of coffee and 2 servings of either tea or soda per day   Immunization History  Administered Date(s) Administered   Fluad Quad(high Dose 65+) 05/05/2021   Influenza, High Dose Seasonal PF 06/12/2018, 05/17/2019   Influenza,inj,Quad PF,6+ Mos 05/23/2016, 06/01/2017   Influenza-Unspecified 06/05/2018, 05/17/2019, 06/07/2020, 05/18/2022, 07/06/2023   PFIZER(Purple Top)SARS-COV-2 Vaccination 09/03/2019, 09/24/2019, 04/01/2020, 10/04/2020   PNEUMOCOCCAL CONJUGATE-20 07/30/2023   Pfizer Covid-19 Vaccine Bivalent Booster 62yrs & up 05/05/2021, 05/18/2022   Pneumococcal  Conjugate-13 07/02/2017   Pneumococcal Polysaccharide-23 05/17/2005   Respiratory Syncytial Virus Vaccine,Recomb Aduvanted(Arexvy) 05/18/2022   Td 10/16/2007   Tdap 03/21/2018   Unspecified SARS-COV-2 Vaccination 07/06/2023   Zoster Recombinant(Shingrix) 06/10/2019, 12/11/2019     Objective: Vital Signs: BP 115/71 (BP Location: Left Arm, Patient Position: Sitting, Cuff Size: Normal)   Pulse (!) 59   Resp 15   Ht 5' 2.5" (1.588 m)   Wt 215 lb 6.4 oz (97.7 kg)   BMI 38.77 kg/m    Physical Exam Vitals and nursing note reviewed.  Constitutional:      Appearance: She is well-developed.  HENT:     Head: Normocephalic and atraumatic.  Eyes:     Conjunctiva/sclera: Conjunctivae normal.  Cardiovascular:     Rate and Rhythm: Normal rate and regular rhythm.     Heart sounds: Normal heart sounds.  Pulmonary:     Effort: Pulmonary effort is normal.     Breath sounds: Normal breath sounds.  Abdominal:     General: Bowel sounds are normal.     Palpations: Abdomen is soft.  Musculoskeletal:     Cervical back: Normal range of motion.  Lymphadenopathy:     Cervical: No cervical adenopathy.  Skin:    General: Skin is warm and dry.     Capillary Refill: Capillary refill takes less than 2 seconds.  Neurological:     Mental Status: She is alert and oriented to person, place, and time.  Psychiatric:        Behavior: Behavior normal.      Musculoskeletal Exam: Patient remained seated during examination today.  C-spine has limited range of motion.  Thoracolumbar scoliosis noted.  Shoulder joints and elbow joints have good range of motion.  Synovial thickening over both wrist joints but no synovitis noted.  Synovial thickening of all MCP joints.  PIP and DIP thickening.  Right knee replacement has limited extension with warmth and swelling.  Left knee joint has no warmth or effusion.  CDAI Exam: CDAI Score: -- Patient Global: --; Provider Global: -- Swollen: --; Tender: -- Joint Exam  11/08/2023   No joint exam has been documented for this visit   There is currently no information documented on the homunculus. Go to the Rheumatology activity and complete  the homunculus joint exam.  Investigation: No additional findings.  Imaging: MM 3D SCREENING MAMMOGRAM BILATERAL BREAST Result Date: 11/07/2023 CLINICAL DATA:  Screening. EXAM: DIGITAL SCREENING BILATERAL MAMMOGRAM WITH TOMOSYNTHESIS AND CAD TECHNIQUE: Bilateral screening digital craniocaudal and mediolateral oblique mammograms were obtained. Bilateral screening digital breast tomosynthesis was performed. The images were evaluated with computer-aided detection. COMPARISON:  Previous exam(s). ACR Breast Density Category b: There are scattered areas of fibroglandular density. FINDINGS: There are no findings suspicious for malignancy. IMPRESSION: No mammographic evidence of malignancy. A result letter of this screening mammogram will be mailed directly to the patient. RECOMMENDATION: Screening mammogram in one year. (Code:SM-B-01Y) BI-RADS CATEGORY  1: Negative. Electronically Signed   By: Edwin Cap M.D.   On: 11/07/2023 15:21    Recent Labs: Lab Results  Component Value Date   WBC 6.9 10/23/2023   HGB 12.3 10/23/2023   PLT 281 10/23/2023   NA 138 10/23/2023   K 4.8 10/23/2023   CL 102 10/23/2023   CO2 19 (L) 10/23/2023   GLUCOSE 95 10/23/2023   BUN 36 (H) 10/23/2023   CREATININE 1.36 (H) 10/23/2023   BILITOT 0.3 10/23/2023   ALKPHOS 117 10/23/2023   AST 15 10/23/2023   ALT 9 10/23/2023   PROT 7.2 10/23/2023   ALBUMIN 4.1 10/23/2023   CALCIUM 9.5 10/23/2023   GFRAA 58 (L) 09/23/2020    Speciality Comments: PLQ Eye Exam: 03/05/2023 [facility] f/u in 1 year   Procedures:  No procedures performed Allergies: Sulfa antibiotics, Cymbalta [duloxetine hcl], Demerol, Ivp dye [iodinated contrast media], Morphine and codeine, Sulfasalazine, and Adhesive [tape]    Assessment / Plan:      Visit Diagnoses: Rheumatoid arthritis involving multiple sites with positive rheumatoid factor (HCC) - Positive RF, positive ANA: No synovitis was noted on examination today.  She resumed taking Plaquenil at a reduced dose after her last office visit on 07/10/2023.  Her rheumatoid arthritis appears well-controlled taking Plaquenil 200 mg 1 tablet by mouth daily.  She is tolerating Plaquenil without any side effects and has not had any recent gaps in therapy.  She has been unsure of how much clinical benefit she has noticed since resuming Plaquenil and was wondering if she should resume Arava now that her pressure ulcer has healed.  Discussed the concern for resuming arava due to immunosuppression and her current renal function.  For now she will remain on Plaquenil as prescribed.  Plan to schedule ultrasound of both hands to assess for synovitis prior to adding any medications.    High risk medication use - Plaquenil 200 mg 1 tablet by mouth by mouth daily-reduced dose with close lab monitoring.  Cleared by wound care 10/10/23-pressure ulcer healed-no recurrence.   PLQ Eye Exam: 03/05/2023 [facility] f/u in 1 year  CBC and CMP updated on 10/23/23.    Sicca syndrome (HCC) - ANA-, Ro-, and La-: Chronic mouth and eye dryness.  She is using Restasis 2 drops twice daily for dry eyes.  Raynaud's disease without gangrene: Not currently symptomatic.  No signs of sclerodactyly.  History of total knee replacement, right: Patient presents today with severe pain and swelling in the right knee replacement.  She has limited extension on examination.  She is been going to physical therapy for the past 7 weeks which has improved her balance but she continues to have significant pain in her right knee replacement.  She is scheduled to see Dr. Lequita Halt in May 2025.   Unilateral primary osteoarthritis, left knee:  No warmth or effusion noted.   Degeneration of intervertebral disc of lumbar region without  discogenic back pain or lower extremity pain: Chronic pain. Using rollator walker to assist with ambulation.   Pressure injury of right buttock, stage 4 (HCC) - stage IV right gluteal ulcer in January 2024. going to wound care for the past 43 weeks.no longer using a wound VAC--discontinued on 06/13/2023.  Pressure injury of deep tissue of right heel: Cleared by wound care on 10/10/2023.  No recurrence.  Fibromyalgia: Patient continues to have chronic myalgias and muscle tenderness due to fibromyalgia.  Chronic SI joint pain: Chronic pain.  Age-related osteoporosis without current pathological fracture - DEXA 08/27/2019: The BMD measured at Femur Total Right is 0.782 g/cm2 with a T-score of -1.8. s/p Forteo. DEXA scheduled on 04/11/2024.  Status post fracture of femur - Right femur fracture in November 2023 after a fall  Other medical conditions are listed as follows:  History of hyperlipidemia  History of cardiac murmur  History of diabetes mellitus  History of peripheral neuropathy  CKD (chronic kidney disease) stage 4, GFR 15-29 ml/min (HCC)  Eosinophilic esophagitis  History of diverticulitis  History of depression  Orders: No orders of the defined types were placed in this encounter.  No orders of the defined types were placed in this encounter.     Follow-Up Instructions: Return in about 5 months (around 04/09/2024) for Rheumatoid arthritis.   Gearldine Bienenstock, PA-C  Note - This record has been created using Dragon software.  Chart creation errors have been sought, but may not always  have been located. Such creation errors do not reflect on  the standard of medical care.

## 2023-10-29 ENCOUNTER — Encounter: Payer: Self-pay | Admitting: Cardiology

## 2023-10-29 ENCOUNTER — Ambulatory Visit: Payer: PPO | Attending: Cardiology | Admitting: Cardiology

## 2023-10-29 VITALS — BP 98/60 | HR 68 | Resp 16 | Ht 62.0 in | Wt 214.8 lb

## 2023-10-29 DIAGNOSIS — I493 Ventricular premature depolarization: Secondary | ICD-10-CM

## 2023-10-29 DIAGNOSIS — R942 Abnormal results of pulmonary function studies: Secondary | ICD-10-CM

## 2023-10-29 DIAGNOSIS — G4733 Obstructive sleep apnea (adult) (pediatric): Secondary | ICD-10-CM

## 2023-10-29 DIAGNOSIS — R931 Abnormal findings on diagnostic imaging of heart and coronary circulation: Secondary | ICD-10-CM

## 2023-10-29 DIAGNOSIS — I5032 Chronic diastolic (congestive) heart failure: Secondary | ICD-10-CM | POA: Diagnosis not present

## 2023-10-29 DIAGNOSIS — E782 Mixed hyperlipidemia: Secondary | ICD-10-CM

## 2023-10-29 DIAGNOSIS — I10 Essential (primary) hypertension: Secondary | ICD-10-CM

## 2023-10-29 NOTE — Addendum Note (Signed)
 Addended by: Frutoso Schatz on: 10/29/2023 03:54 PM   Modules accepted: Orders

## 2023-10-29 NOTE — Patient Instructions (Signed)
 Medication Instructions:  Your physician recommends that you continue on your current medications as directed. Please refer to the Current Medication list given to you today.  *If you need a refill on your cardiac medications before your next appointment, please call your pharmacy*  Testing/Procedures: PFTs Your physician has recommended that you have a pulmonary function test. Pulmonary Function Tests are a group of tests that measure how well air moves in and out of your lungs.  Follow-Up: At [facility], you and your health needs are our priority.  As part of our continuing mission to provide you with exceptional heart care, we have created designated Provider Care Teams.  These Care Teams include your primary Cardiologist (physician) and Advanced Practice Providers (APPs -  Physician Assistants and Nurse Practitioners) who all work together to provide you with the care you need, when you need it.  Your next appointment:   1 year  Provider:   Armanda Magic, MD

## 2023-10-29 NOTE — Progress Notes (Signed)
 Date:  10/29/2023   ID:  [name], DOB 08/07/1951, MRN [id number] The patient was identified using 2 identifiers.  PCP:  Sandre Kitty, MD   [facility] Providers Cardiologist:  None     Evaluation Performed:  Follow-Up Visit  Chief Complaint:  CHF, OSA  History of Present Illness:    [name] is a 73 y.o. female with a hx of Chronic diastolic CHF, normal coronary arteries with normal filling pressures in 07/2019, chronic DOE related to obesity and sedentary state (evaluated in AHF clinic and felt that sx were related to this and now so much CHF), morbid obesity and OSA.  She had a coronary Ca score in 4/23 that was 813 and   When I saw her last January she had stopped using her CPAP there year before because she was traveling a lot and was not have any sleepiness.  At that time I explained the relationship between her CHF and OSA and she agreed to a home sleep study to reassess her OSA.  Her HST showed moderate OSA with an AHI of 15/hr and she underwent CPAP titration and was started on auto CPAP from 4 to 15cm H2O.     She is here today for followup and is doing well.  She denies any chest pain or pressure, SOB, DOE, PND, orthopnea, LE edema, dizziness or syncope.  Rarely she will have some racing of her heart beat. She is compliant with her meds and is tolerating meds with no SE.  She finally has gotten her chronic wound healed and no longer on a wound vac and has started PT.  She has gained about 14 lbs.  She is doing well with her PAP device and thinks that she has gotten used to it. Sometimes she falls asleep before putting it on.  She tolerates the mask and feels the pressure is adequate.  Since going on PAP she feels rested in the am and has no significant daytime sleepiness.  She denies any significant mouth or nasal dryness or nasal congestion.  She does not think that he snores.     Past Medical History:  Diagnosis Date   Agatston coronary artery calcium score  greater than 400 05/2022   coronary Ca score 856   Back pain    Blood transfusion    1981   Chronic diastolic CHF (congestive heart failure) (HCC)    diastolic    COVID    DDD (degenerative disc disease), cervical    DDD (degenerative disc disease), lumbar    Fibromyalgia    GERD (gastroesophageal reflux disease)    Heart murmur    as a child   Hiatal hernia    sjorgens syndrome   High blood pressure    High cholesterol    IBS (irritable bowel syndrome)    OSA (obstructive sleep apnea)    Osteoarthritis    Peripheral autonomic neuropathy of unknown cause    Pre-diabetes    PVC (premature ventricular contraction)    Raynaud disease    Rheumatoid arthritis (HCC)    Sjogren's disease (HCC)    Urticaria    Vitamin D deficiency    Past Surgical History:  Procedure Laterality Date   ABDOMINAL HYSTERECTOMY     BTL, BSO   ADENOIDECTOMY     BREAST EXCISIONAL BIOPSY Left    BREAST SURGERY     mass removal    CHOLECYSTECTOMY     dental implants     DIAGNOSTIC  LAPAROSCOPY     x3   FEMUR FRACTURE SURGERY Right    KNEE ARTHROSCOPY     x2   MASS EXCISION Left 03/22/2017   Procedure: EXCISION OF LEFT BREAST MASS;  Surgeon: Manus Rudd, MD;  Location: [facility];  Service: General;  Laterality: Left;   MULTIPLE TOOTH EXTRACTIONS  06/2023   x5   ORIF FEMUR FRACTURE Right 06/22/2022   Procedure: RIGHT OPEN REDUCTION INTERNAL FIXATION (ORIF) DISTAL FEMUR FRACTURE;  Surgeon: Myrene Galas, MD;  Location: [facility];  Service: Orthopedics;  Laterality: Right;   patotid cystectomy     RIGHT/LEFT HEART CATH AND CORONARY ANGIOGRAPHY N/A 07/25/2019   Procedure: RIGHT/LEFT HEART CATH AND CORONARY ANGIOGRAPHY;  Surgeon: Dolores Patty, MD;  Location: [facility];  Service: Cardiovascular;  Laterality: N/A;   TENDON REPAIR  1980   left ankle and tibia   TONSILLECTOMY     TOTAL KNEE ARTHROPLASTY  06/21/2011   Procedure: TOTAL KNEE ARTHROPLASTY;  Surgeon: Loanne Drilling;  Location:  [facility];  Service: Orthopedics;  Laterality: Right;   TUBAL LIGATION       Current Meds  Medication Sig   amiodarone (PACERONE) 200 MG tablet TAKE 1 TABLET (200 MG TOTAL) BY MOUTH DAILY.   aspirin EC 81 MG tablet Take 81 mg by mouth at bedtime.   cetirizine (ZYRTEC) 10 MG tablet TAKE 1 TABLET (10 MG TOTAL) BY MOUTH DAILY.   colestipol (COLESTID) 1 g tablet Take 2 g by mouth as needed.   cycloSPORINE (RESTASIS) 0.05 % ophthalmic emulsion Place 2 drops into both eyes 2 (two) times daily as needed (irritation).   diphenoxylate-atropine (LOMOTIL) 2.5-0.025 MG per tablet Take 1 tablet by mouth 4 (four) times daily as needed for diarrhea or loose stools.   doxylamine, Sleep, (UNISOM) 25 MG tablet Take 1 tablet (25 mg total) by mouth at bedtime as needed.   fenofibrate (TRICOR) 48 MG tablet Take 1 tablet (48 mg total) by mouth daily.   fluticasone (FLONASE) 50 MCG/ACT nasal spray Place 1 spray into both nostrils daily. (Patient taking differently: Place 1 spray into both nostrils as needed for allergies.)   folic acid (FOLVITE) 1 MG tablet Take 1 tablet (1 mg total) by mouth daily.   gabapentin (NEURONTIN) 300 MG capsule TAKE 2 CAPSULES BY MOUTH 2 TIMES DAILY.   hydroxychloroquine (PLAQUENIL) 200 MG tablet TAKE 1 TABLET (200 MG TOTAL) BY MOUTH DAILY.   hyoscyamine (ANASPAZ) 0.125 MG TBDP disintergrating tablet Place 0.25 mg under the tongue every 6 (six) hours as needed for cramping.    ibuprofen (ADVIL) 800 MG tablet TAKE 1 TABLET BY MOUTH EVERY 6 HOURS AS NEEDED FOR MILD PAIN.   LORazepam (ATIVAN) 0.5 MG tablet TAKE 1 TABLET BY MOUTH ONCE DAILY 1 HOUR PRIOR TO DRESSING CHANGES   methocarbamol (ROBAXIN) 500 MG tablet Take 1 tablet (500 mg total) by mouth every 6 (six) hours as needed for muscle spasms.   metoprolol succinate (TOPROL-XL) 25 MG 24 hr tablet TAKE 1/2 TABLET BY MOUTH (12.5 MG TOTAL) DAILY   Multiple Vitamin (MULTIVITAMIN WITH MINERALS) TABS tablet Take 1 tablet by mouth daily.    NONFORMULARY OR COMPOUNDED ITEM Triamcinolone 0.1% & Silvadene cream 1:1- Apply as directed to affected areas as needed   nystatin (MYCOSTATIN/NYSTOP) powder Apply 1 Application topically 3 (three) times daily. (Patient taking differently: Apply 1 Application topically as needed.)   omeprazole (PRILOSEC) 40 MG capsule Take 40 mg by mouth as needed (nausea).   potassium chloride (  KLOR-CON) 10 MEQ tablet Take 2 tablets (20 mEq total) by mouth daily.   promethazine (PHENERGAN) 25 MG tablet TAKE 1 TABLET BY MOUTH EVERY 6 HOURS AS NEEDED.   rosuvastatin (CRESTOR) 10 MG tablet Take 1 tablet (10 mg total) by mouth daily. Please keep scheduled appointment for future refills. Thank you.   spironolactone (ALDACTONE) 25 MG tablet TAKE 1 TABLET (25 MG TOTAL) BY MOUTH DAILY. (Patient taking differently: Take 12.5 mg by mouth daily.)   torsemide 40 MG TABS Take 40 mg by mouth daily.     Allergies:   Sulfa antibiotics, Cymbalta [duloxetine hcl], Demerol, Ivp dye [iodinated contrast media], Morphine and codeine, Sulfasalazine, and Adhesive [tape]   Social History   Tobacco Use   Smoking status: Never    Passive exposure: Past   Smokeless tobacco: Never  Vaping Use   Vaping status: Never Used  Substance Use Topics   Alcohol use: Yes    Comment: rarely   Drug use: No     Family Hx: The patient's family history includes Alzheimer's disease in her mother; Asthma in her son; Breast cancer in her maternal aunt; Cancer in her father; Depression in her mother; Glaucoma in her brother, brother, and mother; Heart attack in her brother and mother; Heart disease in her brother, father, and mother; High Cholesterol in her father and mother; Hyperlipidemia in her brother; Hypertension in her mother; Sleep apnea in her father. There is no history of Allergic rhinitis, Angioedema, Eczema, Urticaria, or Neuropathy.  ROS:   Please see the history of present illness.     All other systems reviewed and are  negative.   Prior CV studies:   The following studies were reviewed today:  none  Labs/Other Tests and Data Reviewed:    EKG Interpretation Date/Time:  Monday October 29 2023 15:36:52 EDT Ventricular Rate:  68 PR Interval:  208 QRS Duration:  98 QT Interval:  410 QTC Calculation: 435 R Axis:   -13  Text Interpretation: Normal sinus rhythm Incomplete right bundle branch block When compared with ECG of 01-Oct-2023 15:39, No significant change was found Confirmed by Armanda Magic ([id number]) on 10/29/2023 3:37:43 PM    Recent Labs: 05/17/2023: TSH 2.460 10/23/2023: ALT 9; BUN 36; Creatinine, Ser 1.36; Hemoglobin 12.3; Platelets 281; Potassium 4.8; Sodium 138   Recent Lipid Panel Lab Results  Component Value Date/Time   CHOL 120 10/23/2023 11:20 AM   TRIG 125 10/23/2023 11:20 AM   HDL 60 10/23/2023 11:20 AM   CHOLHDL 2.0 10/23/2023 11:20 AM   CHOLHDL 4.7 08/11/2016 10:17 AM   LDLCALC 38 10/23/2023 11:20 AM    Wt Readings from Last 3 Encounters:  10/29/23 214 lb 12.8 oz (97.4 kg)  10/01/23 216 lb (98 kg)  07/30/23 213 lb (96.6 kg)     Risk Assessment/Calculations:          Objective:    Vital Signs:  BP 98/60 (BP Location: Left Arm, Patient Position: Sitting, Cuff Size: Large)   Pulse 68   Resp 16   Ht 5\' 2"  (1.575 m)   Wt 214 lb 12.8 oz (97.4 kg)   SpO2 97%   BMI 39.29 kg/m   GEN: Well nourished, well developed in no acute distress HEENT: Normal NECK: No JVD; No carotid bruits LYMPHATICS: No lymphadenopathy CARDIAC:RRR, no murmurs, rubs, gallops RESPIRATORY:  Clear to auscultation without rales, wheezing or rhonchi  ABDOMEN: Soft, non-tender, non-distended MUSCULOSKELETAL:  1+ BLE edema; No deformity  SKIN: Warm and dry NEUROLOGIC:  Alert  and oriented x 3 PSYCHIATRIC:  Normal affect  ASSESSMENT & PLAN:    OSA - The patient is tolerating PAP therapy well without any problems. The PAP download performed by his DME was personally reviewed and interpreted by me  today and showed an AHI of 3.7/hr on auto CPAP from 4 to 15 cm H2O with 27% compliance in using more than 4 hours nightly.  The patient has been using and benefiting from PAP use and will continue to benefit from therapy.  -encouraged her to be more compliant with her device  Chronic diastolic CHF -evaluated in AHF clinic and felt that chronic SOB related to morbid obesity and sedentary state and recommended a goal of weight loss as her high school weight + 15lbs for her dry weight.   -normal coronary arteries and filling pressures on cath 07/2019 -Volume status difficult to assess in the setting of morbid obesity but does not appear markedly volume overloaded -She has chronic lower extremity and shortness of breath that are stable -Continue prescription drug management with Toprol XL 12.5 mg daily, spironolactone 25 mg daily and torsemide 40 mg daily with as needed refills  -she has CKD followed by nephrology and they are managing BP meds   HTN -BP is controlled on exam today -Continue prescription drug management with spironolactone 25 mg daily, Toprol-XL 12.5 mg daily with as needed refills -nephrology is managing BP meds  PVCs -PVCs well controlled beta-blocker for being amiodarone -followed by Dr. Ladona Ridgel with EP and was started on low dose Amio and has really been able to tell that they have reduced in frequency -Continue prescription drug management with amiodarone 200 mg daily except Sundays and Toprol XL 25 mg daily with as needed refills -I have personally reviewed and interpreted outside labs performed by patient's PCP which showed TSH 2.46 and ALT 06/01/23 -Check PFTs with DLCO -She gets yearly eye examinations  Coronary artery calcifications -her coronary Ca score was 856 in 2023 -She remains asymptomatic -needs aggressive risk factor modification -Continue prescription drug management with aspirin 81 mg daily, Toprol-XL 12.5 mg daily and Crestor 10 mg daily with as needed  refills  HLD -LDL goal < 70 -followed in lipid clinic -I have personally reviewed and interpreted outside labs performed by patient's PCP which showed LDL 59, HDL 49 on 05/17/23 -Continue prescription drug management with Crestor 10 mg daily, fenofibrate 48 mg daily,     Medication Adjustments/Labs and Tests Ordered: Current medicines are reviewed at length with the patient today.  Concerns regarding medicines are outlined above.   Tests Ordered: Orders Placed This Encounter  Procedures   EKG 12-Lead    Medication Changes: No orders of the defined types were placed in this encounter.   Follow Up:  In Person in 1 year(s)  Signed, Armanda Magic, MD  10/29/2023 3:43 PM    [facility]

## 2023-10-30 ENCOUNTER — Ambulatory Visit: Payer: PPO | Admitting: Family Medicine

## 2023-10-31 ENCOUNTER — Ambulatory Visit: Admitting: Physical Therapy

## 2023-10-31 ENCOUNTER — Other Ambulatory Visit: Payer: Self-pay | Admitting: Family Medicine

## 2023-10-31 DIAGNOSIS — F418 Other specified anxiety disorders: Secondary | ICD-10-CM

## 2023-11-01 ENCOUNTER — Encounter (HOSPITAL_COMMUNITY)

## 2023-11-01 NOTE — Progress Notes (Unsigned)
   Established Patient Office Visit  Subjective   Patient ID: [name], female    DOB: Jun 08, 1951  Age: 73 y.o. MRN: [id number]  No chief complaint on file.   HPI  Sacral ulcer - takes ativan prior to this?    The ASCVD Risk score (Arnett DK, et al., 2019) failed to calculate for the following reasons:   The valid total cholesterol range is 130 to 320 mg/dL  Health Maintenance Due  Topic Date Due   Colonoscopy  11/21/2022   Medicare Annual Wellness (AWV)  12/28/2023      Objective:     There were no vitals taken for this visit. {Vitals History (Optional):23777}  Physical Exam   No results found for any visits on 11/02/23.      Assessment & Plan:   There are no diagnoses linked to this encounter.   No follow-ups on file.    Sandre Kitty, MD

## 2023-11-02 ENCOUNTER — Ambulatory Visit (INDEPENDENT_AMBULATORY_CARE_PROVIDER_SITE_OTHER): Payer: PPO | Admitting: Family Medicine

## 2023-11-02 ENCOUNTER — Encounter: Payer: Self-pay | Admitting: Family Medicine

## 2023-11-02 VITALS — BP 130/89 | HR 78 | Ht 62.0 in | Wt 215.0 lb

## 2023-11-02 DIAGNOSIS — M3500 Sicca syndrome, unspecified: Secondary | ICD-10-CM

## 2023-11-02 DIAGNOSIS — K582 Mixed irritable bowel syndrome: Secondary | ICD-10-CM | POA: Diagnosis not present

## 2023-11-02 DIAGNOSIS — G6289 Other specified polyneuropathies: Secondary | ICD-10-CM | POA: Diagnosis not present

## 2023-11-02 DIAGNOSIS — I5032 Chronic diastolic (congestive) heart failure: Secondary | ICD-10-CM | POA: Diagnosis not present

## 2023-11-02 DIAGNOSIS — D649 Anemia, unspecified: Secondary | ICD-10-CM

## 2023-11-02 DIAGNOSIS — K589 Irritable bowel syndrome without diarrhea: Secondary | ICD-10-CM | POA: Insufficient documentation

## 2023-11-02 DIAGNOSIS — I493 Ventricular premature depolarization: Secondary | ICD-10-CM | POA: Diagnosis not present

## 2023-11-02 DIAGNOSIS — M0579 Rheumatoid arthritis with rheumatoid factor of multiple sites without organ or systems involvement: Secondary | ICD-10-CM

## 2023-11-02 DIAGNOSIS — L89324 Pressure ulcer of left buttock, stage 4: Secondary | ICD-10-CM

## 2023-11-02 DIAGNOSIS — G4733 Obstructive sleep apnea (adult) (pediatric): Secondary | ICD-10-CM

## 2023-11-02 DIAGNOSIS — N1832 Chronic kidney disease, stage 3b: Secondary | ICD-10-CM

## 2023-11-02 MED ORDER — TIRZEPATIDE-WEIGHT MANAGEMENT 2.5 MG/0.5ML ~~LOC~~ SOLN: 2.5000 mg | SUBCUTANEOUS | 0 refills | Status: DC

## 2023-11-02 NOTE — Assessment & Plan Note (Signed)
 On Plaquenil.  Follows with rheumatology.

## 2023-11-02 NOTE — Assessment & Plan Note (Signed)
 Wears CPAP.  Would benefit from improved weight loss.  Zepbound should be covered under Medicare.  Patient has previously been on Wegovy in the past.

## 2023-11-02 NOTE — Assessment & Plan Note (Signed)
 Continue amiodarone and metoprolol

## 2023-11-02 NOTE — Assessment & Plan Note (Signed)
 Continue metoprolol, spironolactone 12.5 mg, torsemide 20 mg daily (increases to 40 mg as needed for swelling)

## 2023-11-02 NOTE — Assessment & Plan Note (Signed)
 Follows with Dr. Lacy Duverney routinely at [facility].  Recent creatinine stable.

## 2023-11-02 NOTE — Assessment & Plan Note (Signed)
 Most recent hemoglobin normal.  Taking folic acid supplement.

## 2023-11-02 NOTE — Assessment & Plan Note (Signed)
 Takes colestipol, hyoscyamine, and Lomotil as needed depending on her symptoms.

## 2023-11-02 NOTE — Assessment & Plan Note (Signed)
 Uses Restasis eyedrops.  On Plaquenil for RA.

## 2023-11-02 NOTE — Assessment & Plan Note (Signed)
 Healed.  Last treatment at wound care center was February 26.  On exam today there are no concerns.  Large indentation due to loss of fat and subcutaneous tissue.  She continues to put nystatin on it daily.

## 2023-11-02 NOTE — Assessment & Plan Note (Signed)
 Continue gabapentin 600 twice daily

## 2023-11-02 NOTE — Patient Instructions (Signed)
 It was nice to see you today,  We addressed the following topics today: -I have sent in refills of your Ativan. - I am prescribing Zepbound for you.  This is a newer version of the [facility].  It is taken the same way once weekly - Follow-up with me in 2 months.  Have a great day,  Frederic Jericho, MD

## 2023-11-05 ENCOUNTER — Telehealth: Payer: Self-pay | Admitting: *Deleted

## 2023-11-05 ENCOUNTER — Ambulatory Visit: Admitting: Physical Therapy

## 2023-11-05 NOTE — Telephone Encounter (Signed)
 Copied from CRM [id number]. Topic: General - Other >> Nov 05, 2023  4:18 PM Higinio Roger wrote: Reason for CRM: [employer] would like to know the following for tirzepatide (ZEPBOUND) 2.5 MG/0.5ML injection vial:  What medications has the patient tried for sleep Apnea:  shot notes need to be submited for diagnosis of sleep apena  Callback #: [phone] opt 2  Fax #: [phone]

## 2023-11-06 ENCOUNTER — Ambulatory Visit
Admission: RE | Admit: 2023-11-06 | Discharge: 2023-11-06 | Disposition: A | Discharge status: Home / Self Care | Source: Ambulatory Visit | Attending: Family Medicine

## 2023-11-06 DIAGNOSIS — Z1231 Encounter for screening mammogram for malignant neoplasm of breast: Secondary | ICD-10-CM

## 2023-11-06 NOTE — Telephone Encounter (Signed)
 I have printed out her sleep study report in 2023.  My most recent note mentions that she wears a CPAP as treatment for this.

## 2023-11-07 ENCOUNTER — Encounter: Payer: Self-pay | Admitting: Family Medicine

## 2023-11-07 ENCOUNTER — Ambulatory Visit: Admitting: Physical Therapy

## 2023-11-07 ENCOUNTER — Encounter: Payer: Self-pay | Admitting: Physical Therapy

## 2023-11-07 DIAGNOSIS — R5381 Other malaise: Secondary | ICD-10-CM | POA: Diagnosis not present

## 2023-11-07 DIAGNOSIS — R2689 Other abnormalities of gait and mobility: Secondary | ICD-10-CM

## 2023-11-07 NOTE — Therapy (Signed)
 OUTPATIENT PHYSICAL THERAPY TREATMENT & PROGRESS NOTE  Patient Name: [name] MRN: [id number] DOB:12-20-1950, 73 y.o., female Today's Date: 11/07/2023  END OF SESSION:  PT End of Session - 11/07/23 1614     Visit Number 7    Number of Visits 8    Date for PT Re-Evaluation 12/05/23    Authorization Type HTA    PT Start Time 1615    PT Stop Time 1655    PT Time Calculation (min) 40 min    Equipment Utilized During Treatment Gait belt    Activity Tolerance Patient tolerated treatment well    Behavior During Therapy WFL for tasks assessed/performed                   Past Medical History:  Diagnosis Date   Agatston coronary artery calcium score greater than 400 05/2022   coronary Ca score 856   Back pain    Blood transfusion    1981   Chronic diastolic CHF (congestive heart failure) (HCC)    diastolic    COVID    DDD (degenerative disc disease), cervical    DDD (degenerative disc disease), lumbar    Family history of coronary arteriosclerosis- strong fam h/o CAD and early CAD.  07/17/2017   Fibromyalgia    GERD (gastroesophageal reflux disease)    Heart murmur    as a child   Hiatal hernia    sjorgens syndrome   High blood pressure    High cholesterol    IBS (irritable bowel syndrome)    OSA (obstructive sleep apnea)    Osteoarthritis    Peripheral autonomic neuropathy of unknown cause    Pre-diabetes    PVC (premature ventricular contraction)    Raynaud disease    Rheumatoid arthritis (HCC)    Sjogren's disease (HCC)    Urticaria    Vitamin D deficiency    Past Surgical History:  Procedure Laterality Date   ABDOMINAL HYSTERECTOMY     BTL, BSO   ADENOIDECTOMY     BREAST EXCISIONAL BIOPSY Left    BREAST SURGERY     mass removal    CHOLECYSTECTOMY     dental implants     DIAGNOSTIC LAPAROSCOPY     x3   FEMUR FRACTURE SURGERY Right    KNEE ARTHROSCOPY     x2   MASS EXCISION Left 03/22/2017   Procedure: EXCISION OF LEFT BREAST MASS;   Surgeon: Manus Rudd, MD;  Location: [facility];  Service: General;  Laterality: Left;   MULTIPLE TOOTH EXTRACTIONS  06/2023   x5   ORIF FEMUR FRACTURE Right 06/22/2022   Procedure: RIGHT OPEN REDUCTION INTERNAL FIXATION (ORIF) DISTAL FEMUR FRACTURE;  Surgeon: Myrene Galas, MD;  Location: [facility];  Service: Orthopedics;  Laterality: Right;   patotid cystectomy     RIGHT/LEFT HEART CATH AND CORONARY ANGIOGRAPHY N/A 07/25/2019   Procedure: RIGHT/LEFT HEART CATH AND CORONARY ANGIOGRAPHY;  Surgeon: Dolores Patty, MD;  Location: [facility];  Service: Cardiovascular;  Laterality: N/A;   TENDON REPAIR  1980   left ankle and tibia   TONSILLECTOMY     TOTAL KNEE ARTHROPLASTY  06/21/2011   Procedure: TOTAL KNEE ARTHROPLASTY;  Surgeon: Loanne Drilling;  Location: [facility];  Service: Orthopedics;  Laterality: Right;   TUBAL LIGATION     Patient Active Problem List   Diagnosis Date Noted   IBS (irritable bowel syndrome) 11/02/2023   IDA (iron deficiency anemia) 06/05/2023   Normocytic normochromic anemia 06/05/2023  Normocytic anemia 06/01/2023   Situational anxiety 10/15/2022   Pressure sore of left ischial area, stage IV (HCC) 09/10/2022   DNR (do not resuscitate) 06/21/2022   Agatston coronary artery calcium score greater than 400 05/25/2022   Laryngopharyngeal reflux (LPR) 10/10/2021   Chronic maxillary sinusitis 10/10/2021   Prediabetes 02/14/2021   Chronic kidney disease, stage 3b (HCC) 08/01/2020   Post-menopausal 09/02/2019   Recurrent urticaria 05/20/2018   Vitamin D deficiency 11/20/2017   Mixed hyperlipidemia 11/20/2017   Raynaud's disease without gangrene 03/12/2017   Eosinophilic esophagitis 10/12/2016   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   Autoimmune disease (HCC) 09/02/2016   Abnormality of gait 05/09/2016   Sjogren's syndrome (HCC) 02/29/2016   GERD (gastroesophageal reflux disease) 01/19/2016   Glucose intolerance (impaired glucose tolerance) 01/19/2016    Chronic diastolic CHF (congestive heart failure) (HCC) 04/20/2015   Peripheral neuropathy 02/02/2014   Class 2 obesity due to excess calories with body mass index (BMI) of 36.0 to 36.9 in adult 11/17/2013   Heart murmur    OSA (obstructive sleep apnea)    Rheumatoid arthritis (HCC)    Hiatal hernia    Fibromyalgia    PVC's (premature ventricular contractions)    Unilateral primary osteoarthritis, left knee 06/22/2011    PCP: Melida Quitter, PA  REFERRING PROVIDER: Melida Quitter, PA  REFERRING DIAG: [phone] (ICD-10-CM) - Aftercare for healing traumatic fracture of right femur  THERAPY DIAG:  Physical deconditioning  Other abnormalities of gait and mobility  Rationale for Evaluation and Treatment: Rehabilitation  ONSET DATE: 06/22/22  SUBJECTIVE:   SUBJECTIVE STATEMENT: Stated that she continues to ambulate at home with rollator, uses the cane occasionally. Getting easier to herself and rollator in and out of the car.  Eval statement: Sustained a R distal femur fracture requiring ORIF.  Has had both inpatient rehab as well as HHPT.  Her last formal PT session was March 2024.  She developed pressure ulcers on her R heel and buttock.  Her heel has healed and her buttock ulcer is healing.   PERTINENT HISTORY: Aftercare for healing traumatic fracture of right femur -     Methocarbamol; Take 1 tablet (500 mg total) by mouth every 6 (six) hours as needed for muscle spasms.  Dispense: 120 tablet; Refill: 1 -     Ambulatory referral to Physical Therapy   Pain in right leg -     Methocarbamol; Take 1 tablet (500 mg total) by mouth every 6 (six) hours as needed for muscle spasms.  Dispense: 120 tablet; Refill: 1 PAIN:  Are you having pain? Yes: NPRS scale: 7/10 Pain location: global Pain description: ache, sore Aggravating factors: undetermined Relieving factors: medications and indeterminate  PRECAUTIONS: None  RED FLAGS: None   WEIGHT BEARING RESTRICTIONS:  No  FALLS:  Has patient fallen in last 6 months? No  OCCUPATION: retired  PLOF: Independent  PATIENT GOALS: To increase my strength and mobility  NEXT MD VISIT: 3 months  OBJECTIVE:  Note: Objective measures were completed at Evaluation unless otherwise noted.  DIAGNOSTIC FINDINGS:  Narrative & Impression  CLINICAL DATA:  Fracture, postop.   EXAM: PORTABLE RIGHT KNEE - 1-2 VIEW; RIGHT FEMUR PORTABLE 2 VIEW   COMPARISON:  Preoperative imaging.   FINDINGS: Lateral plate and multi screw fixation of distal femur fracture. The fracture is in improved alignment with mild persistent displacement. There is no new periprosthetic lucency. Right knee arthroplasty is in place. Alignment remains congruent. Recent postsurgical change includes air and edema in  the joint space and soft tissues.   IMPRESSION: ORIF distal femur fracture, in improved alignment. No immediate postoperative complication.     Electronically Signed   By: Narda Rutherford M.D.   On: 06/22/2022 15:08    PATIENT SURVEYS:  LEFS 22/80 10/24/2023: 43/80  MUSCLE LENGTH: Hamstrings: WFL for gait and transfers (assessed seated) Maisie Fus test: UTA  POSTURE: deferred  PALPATION: deferred  LOWER EXTREMITY ROM:  Passive ROM Right eval Left eval  Hip flexion    Hip extension    Hip abduction    Hip adduction    Hip internal rotation    Hip external rotation    Knee flexion    Knee extension    Ankle dorsiflexion    Ankle plantarflexion    Ankle inversion    Ankle eversion     (Blank rows = not tested)  LOWER EXTREMITY MMT:  MMT Right eval Left eval  Hip flexion    Hip extension    Hip abduction    Hip adduction    Hip internal rotation    Hip external rotation    Knee flexion    Knee extension    Ankle dorsiflexion    Ankle plantarflexion    Ankle inversion    Ankle eversion     (Blank rows = not tested)  LOWER EXTREMITY SPECIAL TESTS:  mCTSIB 30s at position 1,2; unable to  maintain position 3,4 for >5 s 10/24/2023: maintained 20s at position 3 and 4  FUNCTIONAL TESTS:  30 seconds chair stand test 5 reps with UE support 10/24/2023: 12  GAIT: Distance walked: 4ftx2  Assistive device utilized: Environmental consultant - 4 wheeled Level of assistance: Complete Independence Comments: slow cadence  [facility] Adult PT Treatment:                                                DATE: 11/07/2023  Therapeutic Exercise: NuStep 8' Lateral/ Step down in // 2x8 Neuromuscular re-ed: Balance on compliant surface free space 3x1' Compliant surface with EC in // 3x'1 Gait training: Gait with pivot turns in //   [facility] Adult PT Treatment:                                                DATE: 10/24/2023 Therapeutic Activity: Re-evaluative measures  Self Care: POC discussion Pt education                                                                                                                                    PATIENT EDUCATION:  Education details: Discussed eval findings, rehab rationale and POC and patient is in agreement  Person educated: Patient Education  method: Explanation Education comprehension: verbalized understanding and needs further education  HOME EXERCISE PROGRAM: Access Code: [email or web address] URL: [email or web address] Date: 08/28/2023 Prepared by: Gustavus Bryant  Exercises - Sit to Stand with Counter Support  - 2 x daily - 5 x weekly - 1 sets - 5 reps - Standing Heel Raise with Support  - 2 x daily - 5 x weekly - 2 sets - 10 reps  ASSESSMENT:  CLINICAL IMPRESSION: Pt attended physical therapy session for continuation of treatment regarding s/p R femur ORIF in 2023. Today's treatment focused on improvement of  ambulation and balance. Pt showed  good tolerance to treatment and demonstrated improvement with balance strategies and confidence during ambulation. Some difficulties continue with R knee pain being the most limiting factor with general function  . Pt required moderate verbal/tactile cuing alongside no physical assistance for safe and appropriate performance of today's activities. Continue with therapeutic focus on balance practice, ensure to cover HEP updates next session for at home balance practice using corner.    Re-Eval Impression 10/24/2023:Pt attended physical therapy session for re-evaluation of deconditioning s/p R femur ORIF in 2023. Pt has made significant progress in all areas of current POC. Pt has met all therapeutic goals and agrees that she would benfit from further therapeutic focus on ambulation and balance.  Pt required minimal cuing as well as contact guard assistance for safe and appropriate performance of today's activities.     OBJECTIVE IMPAIRMENTS: Abnormal gait, decreased activity tolerance, decreased balance, decreased coordination, decreased endurance, decreased mobility, difficulty walking, decreased strength, and impaired perceived functional ability.   ACTIVITY LIMITATIONS: carrying, lifting, sitting, standing, squatting, stairs, transfers, and locomotion level  PERSONAL FACTORS: Fitness, Past/current experiences, Time since onset of injury/illness/exacerbation, and 3+ comorbidities: RA, neuropathy and open wounds  are also affecting patient's functional outcome.   REHAB POTENTIAL: Good  CLINICAL DECISION MAKING: Stable/uncomplicated  EVALUATION COMPLEXITY: Low   GOALS: Goals reviewed with patient? No  SHORT TERM GOALS: Target date: 09/28/2023   Patient to demonstrate independence in HEP  Baseline: [email or web address] Goal status: MET 10/24/2023   LONG TERM GOALS: Target date: 12/05/2023  Patient will acknowledge 4/10 pain at least once during episode of care   Baseline: 7/10 Goal status: MET 10/24/2023  (4/10)  2.  Patient will increase 30s chair stand reps from 5 to 8 with arms to demonstrate and improved functional ability with less pain/difficulty as well as reduce fall risk.  Baseline: 5 10/24/2023:  12 Goal status: MET 4/10  3.  Patient will score at least 38% on LEFS to signify clinically meaningful improvement in functional abilities.   Baseline: 28% (22/80) Goal status: MET 10/24/2023 (43/80; 53.75%)   5.  10s stance time on positions 3,4 on mCTSIB Baseline: <5s each position Goal status: MET 10/24/2023  6. Patient will report ability to ambulate for 30 minutes with LRAD with no LOB to demonstrate improved ambulation tolerance necessary for community ambulation.  Baseline: 10 minutes  Goal status Initial 10/24/2023    PLAN:  PT FREQUENCY: 1x/week  PT DURATION: 8 weeks  PLANNED INTERVENTIONS: 97164- PT Re-evaluation, 97110-Therapeutic exercises, 97530- Therapeutic activity, 97112- Neuromuscular re-education, 97535- Self Care, [id number]- Manual therapy, [phone]- Gait training, [phone]- Aquatic Therapy, Balance training, Stair training, and Dry Needling  PLAN FOR NEXT SESSION: Continue with therapeutic focus on balance practice, ensure to cover HEP updates next session for at home balance practice using corner.   Sheliah Plane, PT, DPT 11/07/2023, 6:06 PM

## 2023-11-08 ENCOUNTER — Encounter: Payer: Self-pay | Admitting: Physician Assistant

## 2023-11-08 ENCOUNTER — Inpatient Hospital Stay (HOSPITAL_COMMUNITY): Admission: RE | Admit: 2023-11-08 | Source: Ambulatory Visit

## 2023-11-08 ENCOUNTER — Ambulatory Visit: Payer: PPO | Attending: Physician Assistant | Admitting: Physician Assistant

## 2023-11-08 ENCOUNTER — Encounter: Payer: Self-pay | Admitting: Family Medicine

## 2023-11-08 VITALS — BP 115/71 | HR 59 | Resp 15 | Ht 62.5 in | Wt 215.4 lb

## 2023-11-08 DIAGNOSIS — M0579 Rheumatoid arthritis with rheumatoid factor of multiple sites without organ or systems involvement: Secondary | ICD-10-CM | POA: Diagnosis not present

## 2023-11-08 DIAGNOSIS — M51369 Other intervertebral disc degeneration, lumbar region without mention of lumbar back pain or lower extremity pain: Secondary | ICD-10-CM | POA: Diagnosis not present

## 2023-11-08 DIAGNOSIS — L89616 Pressure-induced deep tissue damage of right heel: Secondary | ICD-10-CM | POA: Diagnosis not present

## 2023-11-08 DIAGNOSIS — Z79899 Other long term (current) drug therapy: Secondary | ICD-10-CM | POA: Diagnosis not present

## 2023-11-08 DIAGNOSIS — Z8669 Personal history of other diseases of the nervous system and sense organs: Secondary | ICD-10-CM

## 2023-11-08 DIAGNOSIS — M1712 Unilateral primary osteoarthritis, left knee: Secondary | ICD-10-CM

## 2023-11-08 DIAGNOSIS — M533 Sacrococcygeal disorders, not elsewhere classified: Secondary | ICD-10-CM

## 2023-11-08 DIAGNOSIS — L89314 Pressure ulcer of right buttock, stage 4: Secondary | ICD-10-CM | POA: Diagnosis not present

## 2023-11-08 DIAGNOSIS — M797 Fibromyalgia: Secondary | ICD-10-CM

## 2023-11-08 DIAGNOSIS — Z8639 Personal history of other endocrine, nutritional and metabolic disease: Secondary | ICD-10-CM

## 2023-11-08 DIAGNOSIS — M3500 Sicca syndrome, unspecified: Secondary | ICD-10-CM | POA: Diagnosis not present

## 2023-11-08 DIAGNOSIS — Z96651 Presence of right artificial knee joint: Secondary | ICD-10-CM

## 2023-11-08 DIAGNOSIS — I7300 Raynaud's syndrome without gangrene: Secondary | ICD-10-CM | POA: Diagnosis not present

## 2023-11-08 DIAGNOSIS — K200 Eosinophilic esophagitis: Secondary | ICD-10-CM

## 2023-11-08 DIAGNOSIS — N184 Chronic kidney disease, stage 4 (severe): Secondary | ICD-10-CM

## 2023-11-08 DIAGNOSIS — Z8781 Personal history of (healed) traumatic fracture: Secondary | ICD-10-CM

## 2023-11-08 DIAGNOSIS — M810 Age-related osteoporosis without current pathological fracture: Secondary | ICD-10-CM | POA: Diagnosis not present

## 2023-11-08 DIAGNOSIS — G8929 Other chronic pain: Secondary | ICD-10-CM

## 2023-11-08 DIAGNOSIS — Z8679 Personal history of other diseases of the circulatory system: Secondary | ICD-10-CM

## 2023-11-08 DIAGNOSIS — Z8719 Personal history of other diseases of the digestive system: Secondary | ICD-10-CM

## 2023-11-08 DIAGNOSIS — Z8659 Personal history of other mental and behavioral disorders: Secondary | ICD-10-CM

## 2023-11-11 NOTE — Progress Notes (Unsigned)
 PATIENT: [name] DOB: [birth year]  REASON FOR VISIT: follow up HISTORY FROM: patient  No chief complaint on file.    HISTORY OF PRESENT ILLNESS: Today   08/29/22: [name] is a 73 y.o. female with a history of peripheral neuropathy. Returns today for follow-up.  She reports that overall she has been doing well.  She feels that her neuropathy is better.  Remains on gabapentin 600 mg twice a day.  Fell in November and broke her leg (femur). Surgery 11/9. Currently using a wheelchair but can ambulate short distances.  Returns today for evaluation.  08/25/21 [name] is a 73 year old female with a history of Peripheral Neuropathy. She returns today for follow-up. Continues on gabapentin 600 mg twice a day.  She reports that this controls her symptoms.  Occasionally she does have breakthrough symptoms.  She also uses lidocaine patches which she finds beneficial.  She does note that her balance has worsening since the last visit.  Her last fall was in the spring.  She does have a cane that she uses intermittently.  She returns today for an evaluation.  08/25/20: [name] is a 73 year old female with a history of peripheral neuropathy.  She remains on gabapentin 600 mg twice a day.  She states that she continues to have some discomfort in the lower extremities but she feels it is manageable.  She would like to continue to wean off of gabapentin but does not feel she will be able to tolerate the discomfort if she chooses to do so.  The patient denies any significant changes with her gait or balance.  Denies any recent falls.  She does have a cane or walker but only uses it if she feels that she needs it.  She returns today for follow-up.  6/29/21[name] is a 73 year old female with a history of peripheral neuropathy.  She returns today for follow-up.  Reports that she was able to wean herself off of carbamazepine.  She remains on gabapentin 600 mg in the morning and 900 mg at bedtime.   She states that this controls her discomfort.  Reports that she does have numbness and tingling in the lower extremities.  Denies any significant changes with her gait or balance.  No falls.  She returns today for an evaluation.  HISTORY 09/02/18 :   [name] is a 73 year old female with a history of rheumatoid arthritis and peripheral neuropathy.  She returns today for follow-up.  She is currently taking carbamazepine and gabapentin.  She reports that this is managing her neuropathy pain well.  She states that she still has some numbness and tingling throughout the day but it is manageable.  She denies any significant changes with her gait or balance.  She did have a fall back in November while in [place].  She states that she did break some vertebrae but has seen orthopedist and has since been released.  She states that she will use a cane and walker only for long distances.  She states that she was given lidocaine patches by her rheumatologist for RA.  She reports that she is used those on her feet and legs and it is beneficial for her neuropathy.  She would like [place] to try to refill this to see if it will be approved by her insurance.  Patient also states that gabapentin seems to have decreased her libido.  She expressed that she would like to wean off this medication as long as  her pain is under control.  She returns today for evaluation.  REVIEW OF SYSTEMS: Out of a complete 14 system review of symptoms, the patient complains only of the following symptoms, and all other reviewed systems are negative.  See HPI  ALLERGIES: Allergies  Allergen Reactions   Sulfa Antibiotics Hives and Itching    Flushing   Cymbalta [Duloxetine Hcl] Other (See Comments)    Manic reaction   Demerol Hives and Other (See Comments)    Fever    Ivp Dye [Iodinated Contrast Media] Hives and Other (See Comments)    Fever   Morphine And Codeine Other (See Comments)    ineffective   Sulfasalazine Hives and Other (See  Comments)    Flushing   Adhesive [Tape] Rash    HOME MEDICATIONS: Outpatient Medications Prior to Visit  Medication Sig Dispense Refill   amiodarone (PACERONE) 200 MG tablet TAKE 1 TABLET (200 MG TOTAL) BY MOUTH DAILY. 90 tablet 3   aspirin EC 81 MG tablet Take 81 mg by mouth at bedtime.     cetirizine (ZYRTEC) 10 MG tablet TAKE 1 TABLET (10 MG TOTAL) BY MOUTH DAILY. 90 tablet 1   colestipol (COLESTID) 1 g tablet Take 2 g by mouth as needed.     cycloSPORINE (RESTASIS) 0.05 % ophthalmic emulsion Place 2 drops into both eyes 2 (two) times daily as needed (irritation).     diphenoxylate-atropine (LOMOTIL) 2.5-0.025 MG per tablet Take 1 tablet by mouth 4 (four) times daily as needed for diarrhea or loose stools.     doxylamine, Sleep, (UNISOM) 25 MG tablet Take 1 tablet (25 mg total) by mouth at bedtime as needed. 30 tablet 0   fenofibrate (TRICOR) 48 MG tablet Take 1 tablet (48 mg total) by mouth daily. 90 tablet 0   fluticasone (FLONASE) 50 MCG/ACT nasal spray Place 1 spray into both nostrils daily. (Patient taking differently: Place 1 spray into both nostrils as needed for allergies.) 16 g 0   folic acid (FOLVITE) 1 MG tablet Take 1 tablet (1 mg total) by mouth daily. 30 tablet 5   gabapentin (NEURONTIN) 300 MG capsule TAKE 2 CAPSULES BY MOUTH 2 TIMES DAILY. 360 capsule 1   hydroxychloroquine (PLAQUENIL) 200 MG tablet TAKE 1 TABLET (200 MG TOTAL) BY MOUTH DAILY. 90 tablet 0   hyoscyamine (ANASPAZ) 0.125 MG TBDP disintergrating tablet Place 0.25 mg under the tongue every 6 (six) hours as needed for cramping.      ibuprofen (ADVIL) 800 MG tablet TAKE 1 TABLET BY MOUTH EVERY 6 HOURS AS NEEDED FOR MILD PAIN. 90 tablet 1   LORazepam (ATIVAN) 0.5 MG tablet At bedtime prn 15 tablet 2   methocarbamol (ROBAXIN) 500 MG tablet Take 1 tablet (500 mg total) by mouth every 6 (six) hours as needed for muscle spasms. 120 tablet 1   metoprolol succinate (TOPROL-XL) 25 MG 24 hr tablet TAKE 1/2 TABLET BY MOUTH  (12.5 MG TOTAL) DAILY 45 tablet 3   Multiple Vitamin (MULTIVITAMIN WITH MINERALS) TABS tablet Take 1 tablet by mouth daily.     NONFORMULARY OR COMPOUNDED ITEM Triamcinolone 0.1% & Silvadene cream 1:1- Apply as directed to affected areas as needed     nystatin (MYCOSTATIN/NYSTOP) powder Apply 1 Application topically 3 (three) times daily. (Patient taking differently: Apply 1 Application topically as needed.) 60 g 1   nystatin cream (MYCOSTATIN) Apply 1 Application topically daily.     omeprazole (PRILOSEC) 40 MG capsule Take 40 mg by mouth as needed (nausea).  potassium chloride (KLOR-CON) 10 MEQ tablet Take 2 tablets (20 mEq total) by mouth daily. 180 tablet 3   promethazine (PHENERGAN) 25 MG tablet TAKE 1 TABLET BY MOUTH EVERY 6 HOURS AS NEEDED. 90 tablet 0   rosuvastatin (CRESTOR) 10 MG tablet Take 1 tablet (10 mg total) by mouth daily. Please keep scheduled appointment for future refills. Thank you. 30 tablet 1   spironolactone (ALDACTONE) 25 MG tablet TAKE 1 TABLET (25 MG TOTAL) BY MOUTH DAILY. (Patient taking differently: Take 12.5 mg by mouth daily.) 90 tablet 0   tirzepatide (ZEPBOUND) 2.5 MG/0.5ML injection vial Inject 2.5 mg into the skin once a week. (Patient not taking: Reported on 11/08/2023) 2 mL 0   torsemide 40 MG TABS Take 40 mg by mouth daily. 30 tablet 0   No facility-administered medications prior to visit.    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  07/17/2017   Fibromyalgia    GERD (gastroesophageal reflux disease)    Heart murmur    as a child   Hiatal hernia     sjorgens syndrome   High blood pressure    High cholesterol    IBS (irritable bowel syndrome)    OSA (obstructive sleep apnea)    Osteoarthritis    Peripheral autonomic neuropathy of unknown cause    Pre-diabetes    PVC (premature ventricular contraction)    Raynaud disease    Rheumatoid arthritis (HCC)    Sjogren's disease (HCC)    Urticaria    Vitamin D deficiency     PAST SURGICAL HISTORY: Past Surgical History:  Procedure Laterality Date   ABDOMINAL HYSTERECTOMY     BTL, BSO   ADENOIDECTOMY     BREAST EXCISIONAL BIOPSY Left    BREAST SURGERY     mass removal    CHOLECYSTECTOMY     dental implants     DIAGNOSTIC LAPAROSCOPY     x3   FEMUR FRACTURE SURGERY Right    KNEE ARTHROSCOPY     x2   MASS EXCISION Left 03/22/2017   Procedure: EXCISION OF LEFT BREAST MASS;  Surgeon: Manus Rudd, MD;  Location: [facility];  Service: General;  Laterality: Left;   MULTIPLE TOOTH EXTRACTIONS  06/2023   x5   ORIF FEMUR FRACTURE Right 06/22/2022   Procedure: RIGHT OPEN REDUCTION INTERNAL FIXATION (ORIF) DISTAL FEMUR FRACTURE;  Surgeon: Myrene Galas, MD;  Location: [facility];  Service: Orthopedics;  Laterality: Right;   patotid cystectomy     RIGHT/LEFT HEART CATH AND CORONARY ANGIOGRAPHY N/A 07/25/2019   Procedure: RIGHT/LEFT HEART CATH AND CORONARY ANGIOGRAPHY;  Surgeon: Dolores Patty, MD;  Location: [facility];  Service: Cardiovascular;  Laterality: N/A;   TENDON REPAIR  1980   left ankle and tibia   TONSILLECTOMY     TOTAL KNEE ARTHROPLASTY  06/21/2011   Procedure: TOTAL KNEE ARTHROPLASTY;  Surgeon: Loanne Drilling;  Location: [facility];  Service: Orthopedics;  Laterality: Right;   TUBAL LIGATION      FAMILY HISTORY: Family History  Problem  Relation Age of Onset   Alzheimer's disease Mother    Heart attack Mother    Hypertension Mother    Glaucoma Mother    High Cholesterol Mother    Heart disease Mother    Depression Mother    Cancer Father    High Cholesterol  Father    Heart disease Father    Sleep apnea Father    Breast cancer Maternal Aunt    Heart attack Brother    Heart disease Brother    Glaucoma Brother    Hyperlipidemia Brother    Glaucoma Brother    Asthma Son    Allergic rhinitis Neg Hx    Angioedema Neg Hx    Eczema Neg Hx    Urticaria Neg Hx    Neuropathy Neg Hx     SOCIAL HISTORY: Social History   Socioeconomic History   Marital status: Widowed    Spouse name: Not on file   Number of children: 2   Years of education: AS   Highest education level: Bachelor's degree (e.g., BA, AB, BS)  Occupational History   Occupation: retired Engineer, civil (consulting)   Occupation: RN  Tobacco Use   Smoking status: Never    Passive exposure: Past   Smokeless tobacco: Never  Vaping Use   Vaping status: Never Used  Substance and Sexual Activity   Alcohol use: Yes    Comment: occ   Drug use: No   Sexual activity: Never  Other Topics Concern   Not on file  Social History Narrative   Lives at home alone   Right-handed   Drinks 1 or less cups of coffee and 2 servings of either tea or soda per day   Social Drivers of Health   Financial Resource Strain: Low Risk  (07/29/2023)   Overall Financial Resource Strain (CARDIA)    Difficulty of Paying Living Expenses: Not hard at all  Food Insecurity: No Food Insecurity (07/29/2023)   Hunger Vital Sign    Worried About Running Out of Food in the Last Year: Never true    Ran Out of Food in the Last Year: Never true  Transportation Needs: No Transportation Needs (07/29/2023)   PRAPARE - Administrator, Civil Service (Medical): No    Lack of Transportation (Non-Medical): No  Physical Activity: Inactive (07/29/2023)   Exercise Vital Sign    Days of Exercise per Week: 0 days    Minutes of Exercise per Session: 30 min  Stress: No Stress Concern Present (07/29/2023)   [employer] of Occupational Health - Occupational Stress Questionnaire    Feeling of Stress : Only a little  Social  Connections: Moderately Integrated (07/29/2023)   Social Connection and Isolation Panel [NHANES]    Frequency of Communication with Friends and Family: More than three times a week    Frequency of Social Gatherings with Friends and Family: Three times a week    Attends Religious Services: More than 4 times per year    Active Member of Clubs or Organizations: Yes    Attends Banker Meetings: Patient declined    Marital Status: Widowed  Intimate Partner Violence: Not At Risk (12/28/2022)   Humiliation, Afraid, Rape, and Kick questionnaire    Fear of Current or Ex-Partner: No    Emotionally Abused: No    Physically Abused: No    Sexually Abused: No      PHYSICAL EXAM  There were no vitals filed for this visit.   There is no height or  weight on file to calculate BMI.  Generalized: Well developed, in no acute distress   Neurological examination  Mentation: Alert oriented to time, place, history taking. Follows all commands speech and language fluent Cranial nerve II-XII: Pupils were equal round reactive to light. Extraocular movements were full, visual field were full on confrontational test. Facial sensation and strength were normal. Uvula tongue midline. Head turning and shoulder shrug  were normal and symmetric. Motor: The motor testing reveals 5 over 5 strength of all 4 extremities. Good symmetric motor tone is noted throughout.  Sensory: Sensory testing is intact to soft touch on all 4 extremities. No evidence of extinction is noted.  Coordination: Cerebellar testing reveals good finger-nose-finger and heel-to-shin bilaterally.  Gait and station: in a wheelchair Reflexes: Deep tendon reflexes are symmetric and normal bilaterally.   DIAGNOSTIC DATA (LABS, IMAGING, TESTING) - I reviewed patient records, labs, notes, testing and imaging myself where available.  Lab Results  Component Value Date   WBC 6.9 10/23/2023   HGB 12.3 10/23/2023   HCT 38.1 10/23/2023   MCV  85 10/23/2023   PLT 281 10/23/2023      Component Value Date/Time   NA 138 10/23/2023 1120   K 4.8 10/23/2023 1120   CL 102 10/23/2023 1120   CO2 19 (L) 10/23/2023 1120   GLUCOSE 95 10/23/2023 1120   GLUCOSE 106 (H) 06/01/2023 1153   BUN 36 (H) 10/23/2023 1120   CREATININE 1.36 (H) 10/23/2023 1120   CREATININE 1.32 (H) 11/28/2022 1544   CALCIUM 9.5 10/23/2023 1120   PROT 7.2 10/23/2023 1120   ALBUMIN 4.1 10/23/2023 1120   AST 15 10/23/2023 1120   ALT 9 10/23/2023 1120   ALKPHOS 117 10/23/2023 1120   BILITOT 0.3 10/23/2023 1120   GFRNONAA 46 (L) 06/01/2023 1153   GFRNONAA 45 (L) 02/10/2020 1222   GFRAA 58 (L) 09/23/2020 1627   GFRAA 52 (L) 02/10/2020 1222   Lab Results  Component Value Date   CHOL 120 10/23/2023   HDL 60 10/23/2023   LDLCALC 38 10/23/2023   TRIG 125 10/23/2023   CHOLHDL 2.0 10/23/2023   Lab Results  Component Value Date   HGBA1C 5.6 10/23/2023   Lab Results  Component Value Date   VITAMINB12 198 06/01/2023   Lab Results  Component Value Date   TSH 2.460 05/17/2023      ASSESSMENT AND PLAN 73 y.o. year old female  has a past medical history of Agatston coronary artery calcium score greater than 400 (05/2022), Back pain, Blood transfusion, Chronic diastolic CHF (congestive heart failure) (HCC), COVID, DDD (degenerative disc disease), cervical, DDD (degenerative disc disease), lumbar, Family history of coronary arteriosclerosis- strong fam h/o CAD and early CAD.  (07/17/2017), Fibromyalgia, GERD (gastroesophageal reflux disease), Heart murmur, Hiatal hernia, High blood pressure, High cholesterol, IBS (irritable bowel syndrome), OSA (obstructive sleep apnea), Osteoarthritis, Peripheral autonomic neuropathy of unknown cause, Pre-diabetes, PVC (premature ventricular contraction), Raynaud disease, Rheumatoid arthritis (HCC), Sjogren's disease (HCC), Urticaria, and Vitamin D deficiency. here with:  Peripheral neuropathy Gait and Balance abnormality    Continue gabapentin-600 mg twice a day  Referral to PT advised if symptoms worsen or she develops new symptoms she should let us know Follow-up in 1 year or sooner if needed     Butch Penny, MSN, NP-C 11/11/2023, 1:53 PM [facility] [address] [place], [place] [address] [phone]

## 2023-11-12 ENCOUNTER — Ambulatory Visit: Admitting: Adult Health

## 2023-11-12 VITALS — BP 107/57 | HR 56 | Ht 62.0 in | Wt 207.2 lb

## 2023-11-12 DIAGNOSIS — G629 Polyneuropathy, unspecified: Secondary | ICD-10-CM

## 2023-11-12 DIAGNOSIS — R269 Unspecified abnormalities of gait and mobility: Secondary | ICD-10-CM | POA: Diagnosis not present

## 2023-11-12 NOTE — Patient Instructions (Addendum)
 Your Plan:  Decrease gabapentin to 300 mg in the AM, 600 mg in the PM. If tolerating after 2 weeks then decrease to 300 mg twice a day. If tolerating then you can call and I will tell you how to further decrease.   Thank you for coming to see [place] at [facility]. I hope we have been able to provide you high quality care today.  You may receive a patient satisfaction survey over the next few weeks. We would appreciate your feedback and comments so that we may continue to improve ourselves and the health of our patients.

## 2023-11-14 ENCOUNTER — Ambulatory Visit: Admitting: Physical Therapy

## 2023-11-14 ENCOUNTER — Ambulatory Visit (HOSPITAL_COMMUNITY)
Admission: RE | Admit: 2023-11-14 | Discharge: 2023-11-14 | Disposition: A | Discharge status: Home / Self Care | Source: Ambulatory Visit | Attending: Cardiology | Admitting: Cardiology

## 2023-11-14 ENCOUNTER — Ambulatory Visit: Attending: Family Medicine | Admitting: Physical Therapy

## 2023-11-14 DIAGNOSIS — I493 Ventricular premature depolarization: Secondary | ICD-10-CM | POA: Insufficient documentation

## 2023-11-14 DIAGNOSIS — R2689 Other abnormalities of gait and mobility: Secondary | ICD-10-CM

## 2023-11-14 DIAGNOSIS — I5032 Chronic diastolic (congestive) heart failure: Secondary | ICD-10-CM | POA: Diagnosis not present

## 2023-11-14 DIAGNOSIS — I10 Essential (primary) hypertension: Secondary | ICD-10-CM | POA: Diagnosis not present

## 2023-11-14 DIAGNOSIS — E782 Mixed hyperlipidemia: Secondary | ICD-10-CM | POA: Insufficient documentation

## 2023-11-14 DIAGNOSIS — G4733 Obstructive sleep apnea (adult) (pediatric): Secondary | ICD-10-CM | POA: Insufficient documentation

## 2023-11-14 DIAGNOSIS — R5381 Other malaise: Secondary | ICD-10-CM | POA: Diagnosis not present

## 2023-11-14 DIAGNOSIS — R931 Abnormal findings on diagnostic imaging of heart and coronary circulation: Secondary | ICD-10-CM | POA: Insufficient documentation

## 2023-11-14 DIAGNOSIS — Z4889 Encounter for other specified surgical aftercare: Secondary | ICD-10-CM | POA: Diagnosis not present

## 2023-11-14 DIAGNOSIS — Z96651 Presence of right artificial knee joint: Secondary | ICD-10-CM | POA: Diagnosis not present

## 2023-11-14 LAB — PULMONARY FUNCTION TEST
DL/VA % pred: 75 %
DL/VA: 3.18 ml/min/mmHg/L
DLCO unc % pred: 27 %
DLCO unc: 4.91 ml/min/mmHg
FEF 25-75 Post: 1.97 L/s
FEF 25-75 Pre: 1.84 L/s
FEF2575-%Change-Post: 7 %
FEF2575-%Pred-Post: 115 %
FEF2575-%Pred-Pre: 107 %
FEV1-%Change-Post: 3 %
FEV1-%Pred-Post: 80 %
FEV1-%Pred-Pre: 78 %
FEV1-Post: 1.62 L
FEV1-Pre: 1.57 L
FEV1FVC-%Change-Post: 11 %
FEV1FVC-%Pred-Pre: 108 %
FEV6-%Change-Post: -6 %
FEV6-%Pred-Post: 69 %
FEV6-%Pred-Pre: 74 %
FEV6-Post: 1.77 L
FEV6-Pre: 1.88 L
FEV6FVC-%Pred-Post: 104 %
FEV6FVC-%Pred-Pre: 104 %
FVC-%Change-Post: -7 %
FVC-%Pred-Post: 66 %
FVC-%Pred-Pre: 71 %
FVC-Post: 1.77 L
FVC-Pre: 1.91 L
Post FEV1/FVC ratio: 92 %
Post FEV6/FVC ratio: 100 %
Pre FEV1/FVC ratio: 82 %
Pre FEV6/FVC Ratio: 100 %
RV % pred: 105 %
RV: 2.2 L
TLC % pred: 92 %
TLC: 4.35 L

## 2023-11-14 MED ORDER — ALBUTEROL SULFATE (2.5 MG/3ML) 0.083% IN NEBU
2.5000 mg | INHALATION_SOLUTION | Freq: Once | RESPIRATORY_TRACT | Status: AC
  Administered 2023-11-14: 2.5 mg via RESPIRATORY_TRACT

## 2023-11-14 NOTE — Therapy (Addendum)
 OUTPATIENT PHYSICAL THERAPY TREATMENT NOTE  Patient Name: [name] MRN: [id number] DOB:August 27, 1950, 73 y.o., female Today's Date: 11/14/2023  END OF SESSION:    PHYSICAL THERAPY DISCHARGE SUMMARY  Visits from Start of Care: 8  Current functional level related to goals / functional outcomes: See most recent assessment.   Remaining deficits: See most recent assessment.   Education / Equipment: See most recent assessment.   Patient agrees to discharge. Patient goals were partially met. Patient is being discharged due to maximized rehab potential.   Albesa Huguenin, PT, DPT 12/12/2023, 10:19 AM        Past Medical History:  Diagnosis Date   Agatston coronary artery calcium  score greater than 400 05/2022   coronary Ca score 856   Back pain    Blood transfusion    1981   Chronic diastolic CHF (congestive heart failure) (HCC)    diastolic    COVID    DDD (degenerative disc disease), cervical    DDD (degenerative disc disease), lumbar    Family history of coronary arteriosclerosis- strong fam h/o CAD and early CAD.  07/17/2017   Fibromyalgia    GERD (gastroesophageal reflux disease)    Heart murmur    as a child   Hiatal hernia    sjorgens syndrome   High blood pressure    High cholesterol    IBS (irritable bowel syndrome)    OSA (obstructive sleep apnea)    Osteoarthritis    Peripheral autonomic neuropathy of unknown cause    Pre-diabetes    PVC (premature ventricular contraction)    Raynaud disease    Rheumatoid arthritis (HCC)    Sjogren's disease (HCC)    Urticaria    Vitamin D  deficiency    Past Surgical History:  Procedure Laterality Date   ABDOMINAL HYSTERECTOMY     BTL, BSO   ADENOIDECTOMY     BREAST EXCISIONAL BIOPSY Left    BREAST SURGERY     mass removal    CHOLECYSTECTOMY     dental implants     DIAGNOSTIC LAPAROSCOPY     x3   FEMUR FRACTURE SURGERY Right    KNEE ARTHROSCOPY     x2   MASS EXCISION Left 03/22/2017   Procedure:  EXCISION OF LEFT BREAST MASS;  Surgeon: Dareen Ebbing, MD;  Location: [facility];  Service: General;  Laterality: Left;   MULTIPLE TOOTH EXTRACTIONS  06/2023   x5   ORIF FEMUR FRACTURE Right 06/22/2022   Procedure: RIGHT OPEN REDUCTION INTERNAL FIXATION (ORIF) DISTAL FEMUR FRACTURE;  Surgeon: Hardy Lia, MD;  Location: [facility];  Service: Orthopedics;  Laterality: Right;   patotid cystectomy     RIGHT/LEFT HEART CATH AND CORONARY ANGIOGRAPHY N/A 07/25/2019   Procedure: RIGHT/LEFT HEART CATH AND CORONARY ANGIOGRAPHY;  Surgeon: Mardell Shade, MD;  Location: [facility];  Service: Cardiovascular;  Laterality: N/A;   TENDON REPAIR  1980   left ankle and tibia   TONSILLECTOMY     TOTAL KNEE ARTHROPLASTY  06/21/2011   Procedure: TOTAL KNEE ARTHROPLASTY;  Surgeon: Aurther Blue;  Location: [facility];  Service: Orthopedics;  Laterality: Right;   TUBAL LIGATION     Patient Active Problem List   Diagnosis Date Noted   IBS (irritable bowel syndrome) 11/02/2023   IDA (iron  deficiency anemia) 06/05/2023   Normocytic normochromic anemia 06/05/2023   Normocytic anemia 06/01/2023   Situational anxiety 10/15/2022   Pressure sore of left ischial area, stage IV (HCC) 09/10/2022   DNR (do  not resuscitate) 06/21/2022   Agatston coronary artery calcium  score greater than 400 05/25/2022   Laryngopharyngeal reflux (LPR) 10/10/2021   Chronic maxillary sinusitis 10/10/2021   Prediabetes 02/14/2021   Chronic kidney disease, stage 3b (HCC) 08/01/2020   Post-menopausal 09/02/2019   Recurrent urticaria 05/20/2018   Vitamin D  deficiency 11/20/2017   Mixed hyperlipidemia 11/20/2017   Raynaud's disease without gangrene 03/12/2017   Eosinophilic esophagitis 10/12/2016   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   Autoimmune disease (HCC) 09/02/2016   Abnormality of gait 05/09/2016   Sjogren's syndrome (HCC) 02/29/2016   GERD (gastroesophageal reflux disease) 01/19/2016   Glucose intolerance (impaired  glucose tolerance) 01/19/2016   Chronic diastolic CHF (congestive heart failure) (HCC) 04/20/2015   Peripheral neuropathy 02/02/2014   Class 2 obesity due to excess calories with body mass index (BMI) of 36.0 to 36.9 in adult 11/17/2013   Heart murmur    OSA (obstructive sleep apnea)    Rheumatoid arthritis (HCC)    Hiatal hernia    Fibromyalgia    PVC's (premature ventricular contractions)    Unilateral primary osteoarthritis, left knee 06/22/2011    PCP: Noreene Bearded, PA  REFERRING PROVIDER: Noreene Bearded, PA  REFERRING DIAG: [phone] (ICD-10-CM) - Aftercare for healing traumatic fracture of right femur  THERAPY DIAG:  No diagnosis found.  Rationale for Evaluation and Treatment: Rehabilitation  ONSET DATE: 06/22/22  SUBJECTIVE:   SUBJECTIVE STATEMENT: Pt stated that she has been having a lot more pain and swelling in the R knee, has been going to many appointments to focus on that for right now.  Eval statement: Sustained a R distal femur fracture requiring ORIF.  Has had both inpatient rehab as well as HHPT.  Her last formal PT session was March 2024.  She developed pressure ulcers on her R heel and buttock.  Her heel has healed and her buttock ulcer is healing.   PERTINENT HISTORY: Aftercare for healing traumatic fracture of right femur -     Methocarbamol ; Take 1 tablet (500 mg total) by mouth every 6 (six) hours as needed for muscle spasms.  Dispense: 120 tablet; Refill: 1 -     Ambulatory referral to Physical Therapy   Pain in right leg -     Methocarbamol ; Take 1 tablet (500 mg total) by mouth every 6 (six) hours as needed for muscle spasms.  Dispense: 120 tablet; Refill: 1 PAIN:  Are you having pain? Yes: NPRS scale: 7/10 Pain location: global Pain description: ache, sore Aggravating factors: undetermined Relieving factors: medications and indeterminate  PRECAUTIONS: None  RED FLAGS: None   WEIGHT BEARING RESTRICTIONS: No  FALLS:  Has patient  fallen in last 6 months? No  OCCUPATION: retired  PLOF: Independent  PATIENT GOALS: To increase my strength and mobility  NEXT MD VISIT: 3 months  OBJECTIVE:  Note: Objective measures were completed at Evaluation unless otherwise noted.  DIAGNOSTIC FINDINGS:  Narrative & Impression  CLINICAL DATA:  Fracture, postop.   EXAM: PORTABLE RIGHT KNEE - 1-2 VIEW; RIGHT FEMUR PORTABLE 2 VIEW   COMPARISON:  Preoperative imaging.   FINDINGS: Lateral plate and multi screw fixation of distal femur fracture. The fracture is in improved alignment with mild persistent displacement. There is no new periprosthetic lucency. Right knee arthroplasty is in place. Alignment remains congruent. Recent postsurgical change includes air and edema in the joint space and soft tissues.   IMPRESSION: ORIF distal femur fracture, in improved alignment. No immediate postoperative complication.     Electronically Signed  By: Chadwick Colonel M.D.   On: 06/22/2022 15:08    PATIENT SURVEYS:  LEFS 22/80 10/24/2023: 43/80  MUSCLE LENGTH: Hamstrings: WFL for gait and transfers (assessed seated) Andy Bannister test: UTA  POSTURE: deferred  PALPATION: deferred  LOWER EXTREMITY ROM:  Passive ROM Right eval Left eval  Hip flexion    Hip extension    Hip abduction    Hip adduction    Hip internal rotation    Hip external rotation    Knee flexion    Knee extension    Ankle dorsiflexion    Ankle plantarflexion    Ankle inversion    Ankle eversion     (Blank rows = not tested)  LOWER EXTREMITY MMT:  MMT Right eval Left eval  Hip flexion    Hip extension    Hip abduction    Hip adduction    Hip internal rotation    Hip external rotation    Knee flexion    Knee extension    Ankle dorsiflexion    Ankle plantarflexion    Ankle inversion    Ankle eversion     (Blank rows = not tested)  LOWER EXTREMITY SPECIAL TESTS:  mCTSIB 30s at position 1,2; unable to maintain position 3,4 for >5  s 10/24/2023: maintained 20s at position 3 and 4  FUNCTIONAL TESTS:  30 seconds chair stand test 5 reps with UE support 10/24/2023: 12  GAIT: Distance walked: 50ftx2  Assistive device utilized: Environmental consultant - 4 wheeled Level of assistance: Complete Independence Comments: slow cadence  [facility] Adult PT Treatment:                                                DATE: 11/14/2023 Therapeutic Activity: Re-evaluative measures HEP performance to ensure competence Self Care: Long term HEP review POC discussion  [facility] Adult PT Treatment:                                                DATE: 11/07/2023  Therapeutic Exercise: NuStep 8' Lateral/ Step down in // 2x8 Neuromuscular re-ed: Balance on compliant surface free space 3x1' Compliant surface with EC in // 3x'1 Gait training: Gait with pivot turns in //                                                                                                                                    PATIENT EDUCATION:  Education details: Discussed eval findings, rehab rationale and POC and patient is in agreement  Person educated: Patient Education method: Explanation Education comprehension: verbalized understanding and needs further education  HOME EXERCISE PROGRAM: Access Code: [email or web address] URL: [email or web address] Date: 11/14/2023 Prepared by:  Albesa Huguenin  Exercises - Sit to Stand with [employer]  - 1 x daily - 7 x weekly - 3 sets - 10 reps - Side Stepping with Resistance at Ankles and Counter Support  - 1 x daily - 7 x weekly - 3 sets - 10 reps - Standing March with Counter Support  - 1 x daily - 7 x weekly - 3 sets - 10 reps - Semi-Tandem Corner Balance With Eyes Open  - 1 x daily - 7 x weekly - 2-3 sets - 1 reps - 73m hold - Standing Near Stance in Corner with Eyes Closed  - 1 x daily - 7 x weekly - 2-3 sets - 1 reps - 4m hold - Turning in Corner 360  - 1 x daily - 7 x weekly - 2-3 sets - 10 reps  ASSESSMENT:  CLINICAL  IMPRESSION: Pt attended physical therapy session for continuation of treatment regarding balance and global strength s/p R femur ORIF. Today's treatment focused on long term HEP performance and education.. Pt showed  great tolerance to treatment and demonstrated competence with provided education and HEP performance.    Re-Eval Impression 10/24/2023:Pt attended physical therapy session for re-evaluation of deconditioning s/p R femur ORIF in 2023. Pt has made significant progress in all areas of current POC. Pt has met all therapeutic goals and agrees that she would benfit from further therapeutic focus on ambulation and balance.  Pt required minimal cuing as well as contact guard assistance for safe and appropriate performance of today's activities.     OBJECTIVE IMPAIRMENTS: Abnormal gait, decreased activity tolerance, decreased balance, decreased coordination, decreased endurance, decreased mobility, difficulty walking, decreased strength, and impaired perceived functional ability.   ACTIVITY LIMITATIONS: carrying, lifting, sitting, standing, squatting, stairs, transfers, and locomotion level  PERSONAL FACTORS: Fitness, Past/current experiences, Time since onset of injury/illness/exacerbation, and 3+ comorbidities: RA, neuropathy and open wounds are also affecting patient's functional outcome.   REHAB POTENTIAL: Good  CLINICAL DECISION MAKING: Stable/uncomplicated  EVALUATION COMPLEXITY: Low   GOALS: Goals reviewed with patient? No  SHORT TERM GOALS: Target date: 09/28/2023   Patient to demonstrate independence in HEP  Baseline: [id number] Goal status: MET 10/24/2023   LONG TERM GOALS: Target date: 12/05/2023  Patient will acknowledge 4/10 pain at least once during episode of care   Baseline: 7/10 Goal status: MET 10/24/2023  (4/10)  2.  Patient will increase 30s chair stand reps from 5 to 8 with arms to demonstrate and improved functional ability with less pain/difficulty as well as  reduce fall risk.  Baseline: 5 10/24/2023: 12 Goal status: MET 4/10  3.  Patient will score at least 38% on LEFS to signify clinically meaningful improvement in functional abilities.   Baseline: 28% (22/80) Goal status: MET 10/24/2023 (43/80; 53.75%)   5.  10s stance time on positions 3,4 on mCTSIB Baseline: <5s each position Goal status: MET 10/24/2023  6. Patient will report ability to ambulate for 30 minutes with LRAD with no LOB to demonstrate improved ambulation tolerance necessary for community ambulation.  Baseline: 10 minutes  Goal status MET 11/14/2023 (reports being able to walk all morning with rollator)    PLAN:  PT FREQUENCY: 1x/week  PT DURATION: 8 weeks  PLANNED INTERVENTIONS: 97164- PT Re-evaluation, 97110-Therapeutic exercises, 97530- Therapeutic activity, 97112- Neuromuscular re-education, 97535- Self Care, 69629- Manual therapy, [phone]- Gait training, [phone]- Aquatic Therapy, Balance training, Stair training, and Dry Needling  PLAN FOR NEXT SESSION: Continue with therapeutic focus on balance practice, ensure to  cover HEP updates next session for at home balance practice using corner.   Albesa Huguenin, PT, DPT 11/14/2023, 1:08 PM

## 2023-11-14 NOTE — Therapy (Incomplete)
 OUTPATIENT PHYSICAL THERAPY TREATMENT & PROGRESS NOTE  Patient Name: [name] MRN: [id number] DOB:07/07/1951, 73 y.o., female Today's Date: 11/14/2023  END OF SESSION:          Past Medical History:  Diagnosis Date   Agatston coronary artery calcium score greater than 400 05/2022   coronary Ca score 856   Back pain    Blood transfusion    1981   Chronic diastolic CHF (congestive heart failure) (HCC)    diastolic    COVID    DDD (degenerative disc disease), cervical    DDD (degenerative disc disease), lumbar    Family history of coronary arteriosclerosis- strong fam h/o CAD and early CAD.  07/17/2017   Fibromyalgia    GERD (gastroesophageal reflux disease)    Heart murmur    as a child   Hiatal hernia    sjorgens syndrome   High blood pressure    High cholesterol    IBS (irritable bowel syndrome)    OSA (obstructive sleep apnea)    Osteoarthritis    Peripheral autonomic neuropathy of unknown cause    Pre-diabetes    PVC (premature ventricular contraction)    Raynaud disease    Rheumatoid arthritis (HCC)    Sjogren's disease (HCC)    Urticaria    Vitamin D deficiency    Past Surgical History:  Procedure Laterality Date   ABDOMINAL HYSTERECTOMY     BTL, BSO   ADENOIDECTOMY     BREAST EXCISIONAL BIOPSY Left    BREAST SURGERY     mass removal    CHOLECYSTECTOMY     dental implants     DIAGNOSTIC LAPAROSCOPY     x3   FEMUR FRACTURE SURGERY Right    KNEE ARTHROSCOPY     x2   MASS EXCISION Left 03/22/2017   Procedure: EXCISION OF LEFT BREAST MASS;  Surgeon: Manus Rudd, MD;  Location: [facility];  Service: General;  Laterality: Left;   MULTIPLE TOOTH EXTRACTIONS  06/2023   x5   ORIF FEMUR FRACTURE Right 06/22/2022   Procedure: RIGHT OPEN REDUCTION INTERNAL FIXATION (ORIF) DISTAL FEMUR FRACTURE;  Surgeon: Myrene Galas, MD;  Location: [facility];  Service: Orthopedics;  Laterality: Right;   patotid cystectomy     RIGHT/LEFT HEART CATH AND CORONARY  ANGIOGRAPHY N/A 07/25/2019   Procedure: RIGHT/LEFT HEART CATH AND CORONARY ANGIOGRAPHY;  Surgeon: Dolores Patty, MD;  Location: [facility];  Service: Cardiovascular;  Laterality: N/A;   TENDON REPAIR  1980   left ankle and tibia   TONSILLECTOMY     TOTAL KNEE ARTHROPLASTY  06/21/2011   Procedure: TOTAL KNEE ARTHROPLASTY;  Surgeon: Loanne Drilling;  Location: [facility];  Service: Orthopedics;  Laterality: Right;   TUBAL LIGATION     Patient Active Problem List   Diagnosis Date Noted   IBS (irritable bowel syndrome) 11/02/2023   IDA (iron deficiency anemia) 06/05/2023   Normocytic normochromic anemia 06/05/2023   Normocytic anemia 06/01/2023   Situational anxiety 10/15/2022   Pressure sore of left ischial area, stage IV (HCC) 09/10/2022   DNR (do not resuscitate) 06/21/2022   Agatston coronary artery calcium score greater than 400 05/25/2022   Laryngopharyngeal reflux (LPR) 10/10/2021   Chronic maxillary sinusitis 10/10/2021   Prediabetes 02/14/2021   Chronic kidney disease, stage 3b (HCC) 08/01/2020   Post-menopausal 09/02/2019   Recurrent urticaria 05/20/2018   Vitamin D deficiency 11/20/2017   Mixed hyperlipidemia 11/20/2017   Raynaud's disease without gangrene 03/12/2017   Eosinophilic esophagitis  10/12/2016   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   Autoimmune disease (HCC) 09/02/2016   Abnormality of gait 05/09/2016   Sjogren's syndrome (HCC) 02/29/2016   GERD (gastroesophageal reflux disease) 01/19/2016   Glucose intolerance (impaired glucose tolerance) 01/19/2016   Chronic diastolic CHF (congestive heart failure) (HCC) 04/20/2015   Peripheral neuropathy 02/02/2014   Class 2 obesity due to excess calories with body mass index (BMI) of 36.0 to 36.9 in adult 11/17/2013   Heart murmur    OSA (obstructive sleep apnea)    Rheumatoid arthritis (HCC)    Hiatal hernia    Fibromyalgia    PVC's (premature ventricular contractions)    Unilateral primary  osteoarthritis, left knee 06/22/2011    PCP: Melida Quitter, PA  REFERRING PROVIDER: Melida Quitter, PA  REFERRING DIAG: [phone] (ICD-10-CM) - Aftercare for healing traumatic fracture of right femur  THERAPY DIAG:  No diagnosis found.  Rationale for Evaluation and Treatment: Rehabilitation  ONSET DATE: 06/22/22  SUBJECTIVE:   SUBJECTIVE STATEMENT: Stated that she continues to ambulate at home with rollator, uses the cane occasionally. Getting easier to herself and rollator in and out of the car.  Eval statement: Sustained a R distal femur fracture requiring ORIF.  Has had both inpatient rehab as well as HHPT.  Her last formal PT session was March 2024.  She developed pressure ulcers on her R heel and buttock.  Her heel has healed and her buttock ulcer is healing.   PERTINENT HISTORY: Aftercare for healing traumatic fracture of right femur -     Methocarbamol; Take 1 tablet (500 mg total) by mouth every 6 (six) hours as needed for muscle spasms.  Dispense: 120 tablet; Refill: 1 -     Ambulatory referral to Physical Therapy   Pain in right leg -     Methocarbamol; Take 1 tablet (500 mg total) by mouth every 6 (six) hours as needed for muscle spasms.  Dispense: 120 tablet; Refill: 1 PAIN:  Are you having pain? Yes: NPRS scale: 7/10 Pain location: global Pain description: ache, sore Aggravating factors: undetermined Relieving factors: medications and indeterminate  PRECAUTIONS: None  RED FLAGS: None   WEIGHT BEARING RESTRICTIONS: No  FALLS:  Has patient fallen in last 6 months? No  OCCUPATION: retired  PLOF: Independent  PATIENT GOALS: To increase my strength and mobility  NEXT MD VISIT: 3 months  OBJECTIVE:  Note: Objective measures were completed at Evaluation unless otherwise noted.  DIAGNOSTIC FINDINGS:  Narrative & Impression  CLINICAL DATA:  Fracture, postop.   EXAM: PORTABLE RIGHT KNEE - 1-2 VIEW; RIGHT FEMUR PORTABLE 2 VIEW   COMPARISON:   Preoperative imaging.   FINDINGS: Lateral plate and multi screw fixation of distal femur fracture. The fracture is in improved alignment with mild persistent displacement. There is no new periprosthetic lucency. Right knee arthroplasty is in place. Alignment remains congruent. Recent postsurgical change includes air and edema in the joint space and soft tissues.   IMPRESSION: ORIF distal femur fracture, in improved alignment. No immediate postoperative complication.     Electronically Signed   By: Narda Rutherford M.D.   On: 06/22/2022 15:08    PATIENT SURVEYS:  LEFS 22/80 10/24/2023: 43/80  MUSCLE LENGTH: Hamstrings: WFL for gait and transfers (assessed seated) Maisie Fus test: UTA  POSTURE: deferred  PALPATION: deferred  LOWER EXTREMITY ROM:  Passive ROM Right eval Left eval  Hip flexion    Hip extension    Hip abduction    Hip adduction  Hip internal rotation    Hip external rotation    Knee flexion    Knee extension    Ankle dorsiflexion    Ankle plantarflexion    Ankle inversion    Ankle eversion     (Blank rows = not tested)  LOWER EXTREMITY MMT:  MMT Right eval Left eval  Hip flexion    Hip extension    Hip abduction    Hip adduction    Hip internal rotation    Hip external rotation    Knee flexion    Knee extension    Ankle dorsiflexion    Ankle plantarflexion    Ankle inversion    Ankle eversion     (Blank rows = not tested)  LOWER EXTREMITY SPECIAL TESTS:  mCTSIB 30s at position 1,2; unable to maintain position 3,4 for >5 s 10/24/2023: maintained 20s at position 3 and 4  FUNCTIONAL TESTS:  30 seconds chair stand test 5 reps with UE support 10/24/2023: 12  GAIT: Distance walked: 60ftx2  Assistive device utilized: Environmental consultant - 4 wheeled Level of assistance: Complete Independence Comments: slow cadence  [facility] Adult PT Treatment:                                                DATE: 11/14/2023 Therapeutic Activity: Re-evaluative  measures HEP performance to ensure competence Self Care: Long term HEP review POC discussion  [facility] Adult PT Treatment:                                                DATE: 11/07/2023  Therapeutic Exercise: NuStep 8' Lateral/ Step down in // 2x8 Neuromuscular re-ed: Balance on compliant surface free space 3x1' Compliant surface with EC in // 3x'1 Gait training: Gait with pivot turns in //                                                                                                                                    PATIENT EDUCATION:  Education details: Discussed eval findings, rehab rationale and POC and patient is in agreement  Person educated: Patient Education method: Explanation Education comprehension: verbalized understanding and needs further education  HOME EXERCISE PROGRAM: Access Code: [email or web address] URL: [email or web address] Date: 11/14/2023 Prepared by: Sheliah Plane  Exercises - Sit to Stand with Counter Support  - 1 x daily - 7 x weekly - 3 sets - 10 reps - Side Stepping with Resistance at Ankles and Counter Support  - 1 x daily - 7 x weekly - 3 sets - 10 reps - Standing March with Counter Support  - 1 x daily - 7 x weekly - 3 sets - 10 reps -  Semi-Tandem Corner Balance With Eyes Open  - 1 x daily - 7 x weekly - 2-3 sets - 1 reps - 74m hold - Standing Near Stance in Corner with Eyes Closed  - 1 x daily - 7 x weekly - 2-3 sets - 1 reps - 72m hold - Turning in Corner 360  - 1 x daily - 7 x weekly - 2-3 sets - 10 reps  ASSESSMENT:  CLINICAL IMPRESSION: Pt attended physical therapy session for continuation of treatment regarding ***. Today's treatment focused on improvement of  ***. Pt showed  *** tolerance to treatment and demonstrated improvement with ***. Some difficulties ***. Pt required *** cuing alongside *** physical assistance for safe and appropriate performance of ***. Continue with therapeutic focus on ***.   Pt attended physical therapy  session for continuation of treatment regarding s/p R femur ORIF in 2023. Today's treatment focused on improvement of  ambulation and balance. Pt showed  good tolerance to treatment and demonstrated improvement with balance strategies and confidence during ambulation. Some difficulties continue with R knee pain being the most limiting factor with general function . Pt required moderate verbal/tactile cuing alongside no physical assistance for safe and appropriate performance of today's activities. Continue with therapeutic focus on balance practice, ensure to cover HEP updates next session for at home balance practice using corner.    Re-Eval Impression 10/24/2023:Pt attended physical therapy session for re-evaluation of deconditioning s/p R femur ORIF in 2023. Pt has made significant progress in all areas of current POC. Pt has met all therapeutic goals and agrees that she would benfit from further therapeutic focus on ambulation and balance.  Pt required minimal cuing as well as contact guard assistance for safe and appropriate performance of today's activities.     OBJECTIVE IMPAIRMENTS: Abnormal gait, decreased activity tolerance, decreased balance, decreased coordination, decreased endurance, decreased mobility, difficulty walking, decreased strength, and impaired perceived functional ability.   ACTIVITY LIMITATIONS: carrying, lifting, sitting, standing, squatting, stairs, transfers, and locomotion level  PERSONAL FACTORS: Fitness, Past/current experiences, Time since onset of injury/illness/exacerbation, and 3+ comorbidities: RA, neuropathy and open wounds  are also affecting patient's functional outcome.   REHAB POTENTIAL: Good  CLINICAL DECISION MAKING: Stable/uncomplicated  EVALUATION COMPLEXITY: Low   GOALS: Goals reviewed with patient? No  SHORT TERM GOALS: Target date: 09/28/2023   Patient to demonstrate independence in HEP  Baseline: [id number] Goal status: MET 10/24/2023   LONG  TERM GOALS: Target date: 12/05/2023  Patient will acknowledge 4/10 pain at least once during episode of care   Baseline: 7/10 Goal status: MET 10/24/2023  (4/10)  2.  Patient will increase 30s chair stand reps from 5 to 8 with arms to demonstrate and improved functional ability with less pain/difficulty as well as reduce fall risk.  Baseline: 5 10/24/2023: 12 Goal status: MET 4/10  3.  Patient will score at least 38% on LEFS to signify clinically meaningful improvement in functional abilities.   Baseline: 28% (22/80) Goal status: MET 10/24/2023 (43/80; 53.75%)   5.  10s stance time on positions 3,4 on mCTSIB Baseline: <5s each position Goal status: MET 10/24/2023  6. Patient will report ability to ambulate for 30 minutes with LRAD with no LOB to demonstrate improved ambulation tolerance necessary for community ambulation.  Baseline: 10 minutes  Goal status Initial 10/24/2023    PLAN:  PT FREQUENCY: 1x/week  PT DURATION: 8 weeks  PLANNED INTERVENTIONS: 97164- PT Re-evaluation, 97110-Therapeutic exercises, 97530- Therapeutic activity, 97112- Neuromuscular re-education, 97535- Self Care, 16109-  Manual therapy, [id number]- Gait training, 44034- Aquatic Therapy, Balance training, Stair training, and Dry Needling  PLAN FOR NEXT SESSION: Continue with therapeutic focus on balance practice, ensure to cover HEP updates next session for at home balance practice using corner.   Sheliah Plane, PT, DPT 11/14/2023, 1:08 PM

## 2023-11-15 ENCOUNTER — Encounter: Payer: Self-pay | Admitting: Rheumatology

## 2023-11-15 ENCOUNTER — Encounter: Payer: Self-pay | Admitting: Cardiology

## 2023-11-16 ENCOUNTER — Telehealth: Payer: Self-pay

## 2023-11-16 ENCOUNTER — Telehealth: Payer: Self-pay | Admitting: *Deleted

## 2023-11-16 DIAGNOSIS — R942 Abnormal results of pulmonary function studies: Secondary | ICD-10-CM

## 2023-11-16 NOTE — Telephone Encounter (Signed)
 Spoke with pt regarding results. A referral to Dr. Marchelle Gearing was ordered. Pt aware that someone form his office will be reaching out to her. Pt verbalized understanding. All questions, if any, were answered.

## 2023-11-16 NOTE — Telephone Encounter (Signed)
 Patient called back to speak to April - CAL advised agent to advise Patient to call ortho regarding blood results.

## 2023-11-16 NOTE — Telephone Encounter (Signed)
 Lvm for pt to call office to inquire about below.

## 2023-11-16 NOTE — Addendum Note (Signed)
 Addended by: Erick Alley on: 11/16/2023 03:27 PM   Modules accepted: Orders

## 2023-11-16 NOTE — Telephone Encounter (Signed)
 CRM was routed to wrong office pt would need to reach out to orthopedic provider for those results.

## 2023-11-16 NOTE — Telephone Encounter (Signed)
 Copied from CRM [id number]. Topic: Clinical - Lab/Test Results >> Nov 15, 2023  4:59 PM Geneva B wrote: Reason for CRM: patient is calling to speak to someone about blood work results from her orthopedic dr  it is about the crp please call [phone]

## 2023-11-16 NOTE — Telephone Encounter (Signed)
-----   Message from Armanda Magic sent at 11/15/2023  9:31 AM EDT ----- PFTs shows possible effect of Amio on the lungs. Please refer her to Dr. Marchelle Gearing with Pulmonary

## 2023-11-16 NOTE — Telephone Encounter (Signed)
 Pending synovial analysis a decision can be made about restarting arava.   Infection needs to be ruled out because the inflammatory markers can increase with infection as well as with RA flares.   Spoke with Dr. Corliss Skains could consider low dose arava 10 mg every other day if infection is ruled out.

## 2023-11-19 ENCOUNTER — Telehealth: Payer: Self-pay

## 2023-11-19 ENCOUNTER — Ambulatory Visit: Admitting: Pulmonary Disease

## 2023-11-19 VITALS — BP 127/68 | HR 59 | Ht 61.0 in | Wt 215.0 lb

## 2023-11-19 DIAGNOSIS — R0602 Shortness of breath: Secondary | ICD-10-CM | POA: Diagnosis not present

## 2023-11-19 DIAGNOSIS — R942 Abnormal results of pulmonary function studies: Secondary | ICD-10-CM

## 2023-11-19 NOTE — Patient Instructions (Addendum)
 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 Ladona Ridgel and Mayford Knife about discontinuing amiodarone  I will see you back in about 6 weeks  Call us with significant concerns

## 2023-11-19 NOTE — Telephone Encounter (Signed)
-----   Message from Armanda Magic sent at 11/19/2023  1:52 PM EDT ----- Regarding: RE: Abnormal pulmonary function test Please call patient and tell her we need to stop her amiodarone because it is affecting her lungs.  I would like her to get back into see Dr. Ladona Ridgel in 3 to 4 weeks if she has palpitations the interim she needs to call ----- Message ----- From: Tomma Lightning, MD Sent: 11/19/2023   1:43 PM EDT To: Quintella Reichert, MD; Marinus Maw, MD Subject: Abnormal pulmonary function test               Our common patient with abnormal pulmonary function test noted on 11/14/2023 -Isolated reduction in diffusing capacity  - PFT did not have any restrictive physiology -No pulmonary hypertension on recent echocardiogram -not anemic  I do believe she needs to be taken off amiodarone, this is being used to treat SVT Other causes of reduced diffusing capacity will have an associated reduction in volume which she does not have.  Reaching out for options of treatment of her SVT and discontinuation of amiodarone  I am planning to get a high-resolution CT scan of the chest and a repeat PFT in 3 months  [place]

## 2023-11-19 NOTE — Telephone Encounter (Signed)
 Spoke with pt regarding her results. Pt aware to stop taking Amiodarone. Pt stated her pulmonologist gave her this instruction as well. Pt stated she would call us should she begin to have palpitations. An appointment with Dr. Ladona Ridgel was made on 12/31/23 at 9:45 am. Pt aware and agreeable. Pt verbalized understanding. All questions, if any, were answered.

## 2023-11-19 NOTE — Progress Notes (Signed)
 [name]    [id number]    [birth year]  Primary Care Physician:Olson, Linford Ribas, MD  Referring Physician: Jacqueline Matsu, MD [phone] 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 over the last year  Recently had a pulmonary function test showing an isolated reduction in diffusing capacity of 27%  Never smoker No history of underlying lung disease known to her  Has a history of scoliosis  Did have a femur fracture in November 2023 that required surgery, complicated recovery with bedsores and took a while for her to recover Uses a walker currently  She does have neuropathy Has some fluid accumulation in her right knee for which she will be seen orthopedics  Elevated markers of inflammation including ESR and CRP  Outpatient Encounter Medications as of 11/19/2023  Medication Sig   amiodarone (PACERONE) 200 MG tablet TAKE 1 TABLET (200 MG TOTAL) BY MOUTH DAILY.   aspirin EC 81 MG tablet Take 81 mg by mouth at bedtime.   cetirizine (ZYRTEC) 10 MG tablet TAKE 1 TABLET (10 MG TOTAL) BY MOUTH DAILY.   colestipol (COLESTID) 1 g tablet Take 2 g by mouth as needed.   cycloSPORINE (RESTASIS) 0.05 % ophthalmic emulsion Place 2 drops into both eyes 2 (two) times daily as needed (irritation).   diphenoxylate-atropine (LOMOTIL) 2.5-0.025 MG per tablet Take 1 tablet by mouth 4 (four) times daily as needed for diarrhea or loose stools.   doxylamine, Sleep, (UNISOM) 25 MG tablet Take 1 tablet (25 mg total) by mouth at bedtime as needed.   fenofibrate (TRICOR) 48 MG  tablet Take 1 tablet (48 mg total) by mouth daily.   fluticasone (FLONASE) 50 MCG/ACT nasal spray Place 1 spray into both nostrils daily. (Patient taking differently: Place 1 spray into both nostrils as needed for allergies.)   folic acid (FOLVITE) 1 MG tablet Take 1 tablet (1 mg total) by mouth daily.   gabapentin (NEURONTIN) 300 MG capsule TAKE 2 CAPSULES BY MOUTH 2 TIMES DAILY.   hydroxychloroquine (PLAQUENIL) 200 MG tablet TAKE 1 TABLET (200 MG TOTAL) BY MOUTH DAILY.   hyoscyamine (ANASPAZ) 0.125 MG TBDP disintergrating tablet Place 0.25 mg under the tongue every 6 (six) hours as needed for cramping.    ibuprofen (ADVIL) 800 MG tablet TAKE 1 TABLET BY MOUTH EVERY 6 HOURS AS NEEDED FOR MILD PAIN.   LORazepam (ATIVAN) 0.5 MG tablet At bedtime prn   methocarbamol (ROBAXIN) 500 MG tablet Take 1 tablet (500 mg total) by mouth every 6 (six) hours as needed for muscle spasms.   metoprolol succinate (TOPROL-XL) 25 MG 24 hr tablet TAKE 1/2 TABLET BY MOUTH (12.5 MG TOTAL) DAILY   Multiple Vitamin (MULTIVITAMIN WITH MINERALS) TABS tablet Take 1 tablet by mouth daily.   NONFORMULARY OR COMPOUNDED ITEM Triamcinolone 0.1% & Silvadene cream 1:1- Apply as directed to affected areas as needed   nystatin (MYCOSTATIN/NYSTOP) powder Apply 1 Application topically 3 (three) times daily. (Patient taking differently: Apply 1 Application topically as needed.)   nystatin cream (MYCOSTATIN) Apply  1 Application topically daily.   omeprazole (PRILOSEC) 40 MG capsule Take 40 mg by mouth as needed (nausea).   potassium chloride (KLOR-CON) 10 MEQ tablet Take 2 tablets (20 mEq total) by mouth daily.   promethazine (PHENERGAN) 25 MG tablet TAKE 1 TABLET BY MOUTH EVERY 6 HOURS AS NEEDED.   rosuvastatin (CRESTOR) 10 MG tablet Take 1 tablet (10 mg total) by mouth daily. Please keep scheduled appointment for future refills. Thank you.   spironolactone (ALDACTONE) 25 MG tablet TAKE 1 TABLET (25 MG TOTAL) BY MOUTH DAILY. (Patient  taking differently: Take 12.5 mg by mouth daily. 1/2 a Tablet)   tirzepatide (ZEPBOUND) 2.5 MG/0.5ML injection vial Inject 2.5 mg into the skin once a week. (Patient not taking: Reported on 11/12/2023)   torsemide 40 MG TABS Take 40 mg by mouth daily.   No facility-administered encounter medications on file as of 11/19/2023.    Allergies as of 11/19/2023 - Reviewed 11/14/2023  Allergen Reaction Noted   Sulfa antibiotics Hives and Itching 07/12/2017   Cymbalta [duloxetine hcl] Other (See Comments) 02/09/2016   Demerol Hives and Other (See Comments) 06/06/2011   Ivp dye [iodinated contrast media] Hives and Other (See Comments) 06/06/2011   Morphine and codeine Other (See Comments) 06/13/2011   Sulfasalazine Hives and Other (See Comments) 09/06/2017   Adhesive [tape] Rash 06/13/2011    Past Medical History:  Diagnosis Date   Agatston coronary artery calcium score greater than 400 05/2022   coronary Ca score 856   Back pain    Blood transfusion    1981   Chronic diastolic CHF (congestive heart failure) (HCC)    diastolic    COVID    DDD (degenerative disc disease), cervical    DDD (degenerative disc disease), lumbar    Family history of coronary arteriosclerosis- strong fam h/o CAD and early CAD.  07/17/2017   Fibromyalgia    GERD (gastroesophageal reflux disease)    Heart murmur    as a child   Hiatal hernia    sjorgens syndrome   High blood pressure    High cholesterol    IBS (irritable bowel syndrome)    OSA (obstructive sleep apnea)    Osteoarthritis    Peripheral autonomic neuropathy of unknown cause    Pre-diabetes    PVC (premature ventricular contraction)    Raynaud disease    Rheumatoid arthritis (HCC)    Sjogren's disease (HCC)    Urticaria    Vitamin D deficiency     Past Surgical History:  Procedure Laterality Date   ABDOMINAL HYSTERECTOMY     BTL, BSO   ADENOIDECTOMY     BREAST EXCISIONAL BIOPSY Left    BREAST SURGERY     mass removal     CHOLECYSTECTOMY     dental implants     DIAGNOSTIC LAPAROSCOPY     x3   FEMUR FRACTURE SURGERY Right    KNEE ARTHROSCOPY     x2   MASS EXCISION Left 03/22/2017   Procedure: EXCISION OF LEFT BREAST MASS;  Surgeon: Dareen Ebbing, MD;  Location: [facility];  Service: General;  Laterality: Left;   MULTIPLE TOOTH EXTRACTIONS  06/2023   x5   ORIF FEMUR FRACTURE Right 06/22/2022   Procedure: RIGHT OPEN REDUCTION INTERNAL FIXATION (ORIF) DISTAL FEMUR FRACTURE;  Surgeon: Hardy Lia, MD;  Location: [facility];  Service: Orthopedics;  Laterality: Right;   patotid cystectomy     RIGHT/LEFT HEART CATH AND CORONARY ANGIOGRAPHY N/A 07/25/2019   Procedure: RIGHT/LEFT HEART CATH  AND CORONARY ANGIOGRAPHY;  Surgeon: Mardell Shade, MD;  Location: [facility];  Service: Cardiovascular;  Laterality: N/A;   TENDON REPAIR  1980   left ankle and tibia   TONSILLECTOMY     TOTAL KNEE ARTHROPLASTY  06/21/2011   Procedure: TOTAL KNEE ARTHROPLASTY;  Surgeon: Aurther Blue;  Location: [facility];  Service: Orthopedics;  Laterality: Right;   TUBAL LIGATION      Family History  Problem Relation Age of Onset   Alzheimer's disease Mother    Heart attack Mother    Hypertension Mother    Glaucoma Mother    High Cholesterol Mother    Heart disease Mother    Depression Mother    Cancer Father    High Cholesterol Father    Heart disease Father    Sleep apnea Father    Breast cancer Maternal Aunt    Heart attack Brother    Heart disease Brother    Glaucoma Brother    Hyperlipidemia Brother    Glaucoma Brother    Asthma Son    Allergic rhinitis Neg Hx    Angioedema Neg Hx    Eczema Neg Hx    Urticaria Neg Hx    Neuropathy Neg Hx     Social History   Socioeconomic History   Marital status: Widowed    Spouse name: Not on file   Number of children: 2   Years of education: AS   Highest education level: Bachelor's degree (e.g., BA, AB, BS)  Occupational History   Occupation: retired Engineer, civil (consulting)    Occupation: RN  Tobacco Use   Smoking status: Never    Passive exposure: Past   Smokeless tobacco: Never  Vaping Use   Vaping status: Never Used  Substance and Sexual Activity   Alcohol use: Yes    Comment: occ   Drug use: No   Sexual activity: Never  Other Topics Concern   Not on file  Social History Narrative   Lives at home alone   Right-handed   Drinks 1 or less cups of coffee and 2 servings of either tea or soda per day   Social Drivers of Health   Financial Resource Strain: Low Risk  (07/29/2023)   Overall Financial Resource Strain (CARDIA)    Difficulty of Paying Living Expenses: Not hard at all  Food Insecurity: No Food Insecurity (07/29/2023)   Hunger Vital Sign    Worried About Running Out of Food in the Last Year: Never true    Ran Out of Food in the Last Year: Never true  Transportation Needs: No Transportation Needs (07/29/2023)   PRAPARE - Administrator, Civil Service (Medical): No    Lack of Transportation (Non-Medical): No  Physical Activity: Inactive (07/29/2023)   Exercise Vital Sign    Days of Exercise per Week: 0 days    Minutes of Exercise per Session: 30 min  Stress: No Stress Concern Present (07/29/2023)   [employer] of Occupational Health - Occupational Stress Questionnaire    Feeling of Stress : Only a little  Social Connections: Moderately Integrated (07/29/2023)   Social Connection and Isolation Panel [NHANES]    Frequency of Communication with Friends and Family: More than three times a week    Frequency of Social Gatherings with Friends and Family: Three times a week    Attends Religious Services: More than 4 times per year    Active Member of Clubs or Organizations: Yes    Attends Club or  Organization Meetings: Patient declined    Marital Status: Widowed  Intimate Partner Violence: Not At Risk (12/28/2022)   Humiliation, Afraid, Rape, and Kick questionnaire    Fear of Current or Ex-Partner: No    Emotionally Abused: No     Physically Abused: No    Sexually Abused: No    Review of Systems  Respiratory:  Positive for shortness of breath.   Psychiatric/Behavioral:  Negative for sleep disturbance.     There were no vitals filed for this visit.   Physical Exam Constitutional:      Appearance: She is obese.  HENT:     Nose: Nose normal.     Mouth/Throat:     Mouth: Mucous membranes are moist.  Cardiovascular:     Rate and Rhythm: Normal rate and regular rhythm.     Heart sounds: No murmur heard.    No friction rub.  Pulmonary:     Effort: No respiratory distress.     Breath sounds: No stridor. No wheezing or rhonchi.  Neurological:     Mental Status: She is alert.  Psychiatric:        Mood and Affect: Mood normal.    Data Reviewed: Elevated ESR, elevated CRP  Chest x-ray reviewed showing scoliosis, no acute infiltrate  CT scan of the chest reviewed by myself showing hypoventilated lung fields on the left  Pulmonary function test was reviewed with the patient showing isolated reduction in diffusing capacity with normal lung volumes  Echocardiogram - Normal ejection fraction, diastolic dysfunction.  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

## 2023-11-21 ENCOUNTER — Ambulatory Visit: Admitting: Physical Therapy

## 2023-11-22 ENCOUNTER — Other Ambulatory Visit: Payer: Self-pay | Admitting: Oncology

## 2023-11-22 ENCOUNTER — Telehealth: Payer: Self-pay

## 2023-11-22 DIAGNOSIS — G4733 Obstructive sleep apnea (adult) (pediatric): Secondary | ICD-10-CM | POA: Diagnosis not present

## 2023-11-22 DIAGNOSIS — D649 Anemia, unspecified: Secondary | ICD-10-CM

## 2023-11-22 NOTE — Telephone Encounter (Signed)
 Spoke with pt on phone and informed her that she will have another appt with Dr. Arlana Pouch, along with a lab appt and to expect a scheduler to reach out to her to make follow up appt.

## 2023-11-23 ENCOUNTER — Telehealth: Payer: Self-pay | Admitting: Oncology

## 2023-11-23 NOTE — Telephone Encounter (Signed)
 Patient scheduled appointments. Patient is aware of all appointment details.

## 2023-11-28 ENCOUNTER — Encounter: Admitting: Physical Therapy

## 2023-11-29 ENCOUNTER — Other Ambulatory Visit

## 2023-11-29 DIAGNOSIS — M069 Rheumatoid arthritis, unspecified: Secondary | ICD-10-CM | POA: Diagnosis not present

## 2023-11-29 DIAGNOSIS — Z96651 Presence of right artificial knee joint: Secondary | ICD-10-CM | POA: Diagnosis not present

## 2023-11-29 DIAGNOSIS — M25561 Pain in right knee: Secondary | ICD-10-CM | POA: Diagnosis not present

## 2023-11-30 ENCOUNTER — Ambulatory Visit
Admission: RE | Admit: 2023-11-30 | Discharge: 2023-11-30 | Disposition: A | Discharge status: Home / Self Care | Source: Ambulatory Visit | Attending: Pulmonary Disease | Admitting: Pulmonary Disease

## 2023-11-30 DIAGNOSIS — J398 Other specified diseases of upper respiratory tract: Secondary | ICD-10-CM | POA: Diagnosis not present

## 2023-11-30 DIAGNOSIS — R0602 Shortness of breath: Secondary | ICD-10-CM | POA: Diagnosis not present

## 2023-11-30 DIAGNOSIS — R942 Abnormal results of pulmonary function studies: Secondary | ICD-10-CM

## 2023-11-30 DIAGNOSIS — I700 Atherosclerosis of aorta: Secondary | ICD-10-CM | POA: Diagnosis not present

## 2023-12-04 ENCOUNTER — Ambulatory Visit: Admitting: Physical Therapy

## 2023-12-08 NOTE — Progress Notes (Signed)
 COVID Vaccine received:  []  No [x]  Yes Date of any COVID positive Test in last 90 days:  PCP - Ambrosio Junker, MD  Marceil Sensor, MD [phone] (Work)  Cardiologist - Gaylyn Keas, MD  Pulmonary- Myer Artis, MD   Chest x-ray - 06-23-2022  1v  Epic CT chest 11-30-2023  Epic   EKG -  10-29-2023  Epic Stress Test -  ECHO -11-15-2021  Epic  Cardiac Cath - 07-25-2019 [facility] By Dr. Julane Ny,  CT Cardiac calcium  score of 856 on 11-15-2021  Epic  Pacemaker / ICD device [x]  No []  Yes   Spinal Cord Stimulator:[x]  No []  Yes       History of Sleep Apnea? []  No [x]  Yes   CPAP used?- []  No []  Yes    Does the patient monitor blood sugar?   []  N/A   []  No []  Yes  Patient has: []  NO Hx DM   [x]  Pre-DM   []  DM1  []   DM2 Last A1c was: 5.6 on 10-23-23     Does patient have a [employer] or Dexcom? []  No []  Yes   Fasting Blood Sugar Ranges-  Checks Blood Sugar _____ times a day  Tirzepatide (ZepBound)-  hold x 7-10 days  Blood Thinner / Instructions:None Aspirin  Instructions:   ASA 81 mg  ERAS Protocol Ordered: []  No  []  Yes PRE-SURGERY []  ENSURE  []  G2   []  No Drink Ordered  Patient is to be NPO after:    NO ORDERS   Dental hx: []  Dentures:  []  N/A      []  Bridge or Partial:                   []  Loose or Damaged teeth:   Comments: Patient is a retired Charity fundraiser   Activity level: Patient is able / unable to climb a flight of stairs without difficulty; []  No CP  []  No SOB, but would have ___   Patient can / can not perform ADLs without assistance.   Anesthesia review: CHF, Murmur as a child, PVCs (currently not on amiodarone  d/t causing pulm. Fibrosis), Pre-DM, RA, Sjogren's, Raynaud's, Fibromyalgia, anxiety, CKD3b, anemia,   Patient denies shortness of breath, fever, cough and chest pain at PAT appointment.  Patient verbalized understanding and agreement to the Pre-Surgical Instructions that were given to them at this PAT appointment. Patient was also educated of the need to review these  PAT instructions again prior to her surgery.I reviewed the appropriate phone numbers to call if they have any and questions or concerns.

## 2023-12-08 NOTE — Patient Instructions (Addendum)
 SURGICAL WAITING ROOM VISITATION Patients having surgery or a procedure may have no more than 2 support people in the waiting area - these visitors may rotate in the visitor waiting room.   If the patient needs to stay at the hospital during part of their recovery, the visitor guidelines for inpatient rooms apply.  PRE-OP VISITATION  Pre-op nurse will coordinate an appropriate time for 1 support person to accompany the patient in pre-op.  This support person may not rotate.  This visitor will be contacted when the time is appropriate for the visitor to come back in the pre-op area.  Please refer to the [facility] website for the visitor guidelines for Inpatients (after your surgery is over and you are in a regular room).  You are not required to quarantine at this time prior to your surgery. However, you must do this: Hand Hygiene often Do NOT share personal items Notify your provider if you are in close contact with someone who has COVID or you develop fever 100.4 or greater, new onset of sneezing, cough, sore throat, shortness of breath or body aches.  If you test positive for Covid or have been in contact with anyone that has tested positive in the last 10 days please notify you surgeon.    Your procedure is scheduled on:  [facility]  December 12, 2023  Report to [facility] Main Entrance: [address] entrance where the [employer] is available.   Report to admitting at: 11:30    AM  Call this number if you have any questions or problems the morning of surgery [phone]  FOLLOW ANY ADDITIONAL PRE OP INSTRUCTIONS YOU RECEIVED FROM YOUR SURGEON'S OFFICE!!!  Do not eat food after Midnight the night prior to your surgery/procedure.  After Midnight you may have the following liquids until 11:00???? AM DAY OF SURGERY  Clear Liquid Diet Water Black Coffee (sugar ok, NO MILK/CREAM OR CREAMERS)  Tea (sugar ok, NO MILK/CREAM OR CREAMERS) regular and decaf                              Plain Jell-O  with no fruit (NO RED)                                           Fruit ices (not with fruit pulp, NO RED)                                     Popsicles (NO RED)                                                                  Juice: NO CITRUS JUICES: only apple, WHITE grape, WHITE cranberry Sports drinks like Gatorade or Powerade (NO RED)                   The day of surgery:  Drink ONE (1) Pre-Surgery G2 at   11:00 ???? AM the morning of surgery. Drink in one sitting. Do not sip.  This drink was given to  you during your hospital pre-op appointment visit. Nothing else to drink after completing the Pre-Surgery  G2 : No candy, chewing gum or throat lozenges.     Oral Hygiene is also important to reduce your risk of infection.        Remember - BRUSH YOUR TEETH THE MORNING OF SURGERY WITH YOUR REGULAR TOOTHPASTE  Do NOT smoke after Midnight the night before surgery.  STOP TAKING all Vitamins, Herbs and supplements 1 week before your surgery.   DO NOT TAKE TORSEMIDE  OR SPIRONOLACTONE  THE DAY OF SURGERY.  HYDROXYCHLOROQUINE  (PLAQUENIL )-  stop taking ?????  Take ONLY these medicines the morning of surgery with A SIP OF WATER: metoprolol , gabapentin , cetirizine  and you may take omeprazole if needed. You may use your Eye drops if needed.   If You have been diagnosed with Sleep Apnea - Bring CPAP mask and tubing day of surgery. We will provide you with a CPAP machine on the day of your surgery.                   You may not have any metal on your body including hair pins, jewelry, and body piercing  Do not wear make-up, lotions, powders, perfumes or deodorant  Do not wear nail polish including gel and S&S, artificial / acrylic nails, or any other type of covering on natural nails including finger and toenails. If you have artificial nails, gel coating, etc., that needs to be removed by a nail salon, Please have this removed prior to surgery. Not doing so may mean that your  surgery could be cancelled or delayed if the Surgeon or anesthesia staff feels like they are unable to monitor you safely.   Do not shave 48 hours prior to surgery to avoid nicks in your skin which may contribute to postoperative infections.   Contacts, Hearing Aids, dentures or bridgework may not be worn into surgery. DENTURES WILL BE REMOVED PRIOR TO SURGERY PLEASE DO NOT APPLY "Poly grip" OR ADHESIVES!!!  You may bring a small overnight bag with you on the day of surgery, only pack items that are not valuable. [facility] IS NOT RESPONSIBLE   FOR VALUABLES THAT ARE LOST OR STOLEN.   Do not bring your home medications to the hospital. The Pharmacy will dispense medications listed on your medication list to you during your admission in the Hospital.  Special Instructions: Bring a copy of your healthcare power of attorney and living will documents the day of surgery, if you wish to have them scanned into your [facility] Records- EPIC  Please read over the following fact sheets you were given: IF YOU HAVE QUESTIONS ABOUT YOUR PRE-OP INSTRUCTIONS, PLEASE CALL [phone]   [facility] - Preparing for Surgery Before surgery, you can play an important role.  Because skin is not sterile, your skin needs to be as free of germs as possible.  You can reduce the number of germs on your skin by washing with CHG (chlorahexidine gluconate) soap before surgery.  CHG is an antiseptic cleaner which kills germs and bonds with the skin to continue killing germs even after washing. Please DO NOT use if you have an allergy  to CHG or antibacterial soaps.  If your skin becomes reddened/irritated stop using the CHG and inform your nurse when you arrive at Short Stay. Do not shave (including legs and underarms) for at least 48 hours prior to the first CHG shower.  You may shave your face/neck.  Please follow these instructions carefully:  1.  Shower with CHG Soap the night before surgery and the  morning of  surgery.  2.  If you choose to wash your hair, wash your hair first as usual with your normal  shampoo.  3.  After you shampoo, rinse your hair and body thoroughly to remove the shampoo.                             4.  Use CHG as you would any other liquid soap.  You can apply chg directly to the skin and wash.  Gently with a scrungie or clean washcloth.  5.  Apply the CHG Soap to your body ONLY FROM THE NECK DOWN.   Do not use on face/ open                           Wound or open sores. Avoid contact with eyes, ears mouth and genitals (private parts).                       Wash face,  Genitals (private parts) with your normal soap.             6.  Wash thoroughly, paying special attention to the area where your  surgery  will be performed.  7.  Thoroughly rinse your body with warm water from the neck down.  8.  DO NOT shower/wash with your normal soap after using and rinsing off the CHG Soap.            9.  Pat yourself dry with a clean towel.            10.  Wear clean pajamas.            11.  Place clean sheets on your bed the night of your first shower and do not  sleep with pets.  ON THE DAY OF SURGERY : Do not apply any lotions/deodorants the morning of surgery.  Please wear clean clothes to the hospital/surgery center.     FAILURE TO FOLLOW THESE INSTRUCTIONS MAY RESULT IN THE CANCELLATION OF YOUR SURGERY  PATIENT SIGNATURE_________________________________  NURSE SIGNATURE__________________________________  ________________________________________________________________________

## 2023-12-10 ENCOUNTER — Telehealth: Payer: Self-pay

## 2023-12-10 ENCOUNTER — Other Ambulatory Visit: Payer: Self-pay | Admitting: Cardiology

## 2023-12-10 ENCOUNTER — Encounter (HOSPITAL_COMMUNITY)
Admission: RE | Admit: 2023-12-10 | Discharge: 2023-12-10 | Disposition: A | Discharge status: Home / Self Care | Source: Ambulatory Visit | Attending: Orthopedic Surgery | Admitting: Orthopedic Surgery

## 2023-12-10 ENCOUNTER — Telehealth: Payer: Self-pay | Admitting: Pulmonary Disease

## 2023-12-10 ENCOUNTER — Other Ambulatory Visit: Payer: Self-pay

## 2023-12-10 ENCOUNTER — Encounter (HOSPITAL_COMMUNITY): Payer: Self-pay

## 2023-12-10 ENCOUNTER — Encounter: Payer: Self-pay | Admitting: Cardiology

## 2023-12-10 ENCOUNTER — Encounter: Payer: Self-pay | Admitting: Pulmonary Disease

## 2023-12-10 VITALS — BP 128/60 | HR 62 | Temp 98.6°F | Resp 20 | Ht 61.0 in | Wt 218.0 lb

## 2023-12-10 DIAGNOSIS — I4719 Other supraventricular tachycardia: Secondary | ICD-10-CM | POA: Insufficient documentation

## 2023-12-10 DIAGNOSIS — G4733 Obstructive sleep apnea (adult) (pediatric): Secondary | ICD-10-CM | POA: Insufficient documentation

## 2023-12-10 DIAGNOSIS — Z96651 Presence of right artificial knee joint: Secondary | ICD-10-CM | POA: Insufficient documentation

## 2023-12-10 DIAGNOSIS — M797 Fibromyalgia: Secondary | ICD-10-CM | POA: Insufficient documentation

## 2023-12-10 DIAGNOSIS — I130 Hypertensive heart and chronic kidney disease with heart failure and stage 1 through stage 4 chronic kidney disease, or unspecified chronic kidney disease: Secondary | ICD-10-CM | POA: Insufficient documentation

## 2023-12-10 DIAGNOSIS — M069 Rheumatoid arthritis, unspecified: Secondary | ICD-10-CM | POA: Insufficient documentation

## 2023-12-10 DIAGNOSIS — Z01818 Encounter for other preprocedural examination: Secondary | ICD-10-CM | POA: Insufficient documentation

## 2023-12-10 DIAGNOSIS — E669 Obesity, unspecified: Secondary | ICD-10-CM | POA: Insufficient documentation

## 2023-12-10 DIAGNOSIS — N1831 Chronic kidney disease, stage 3a: Secondary | ICD-10-CM | POA: Insufficient documentation

## 2023-12-10 DIAGNOSIS — M3500 Sicca syndrome, unspecified: Secondary | ICD-10-CM | POA: Insufficient documentation

## 2023-12-10 DIAGNOSIS — Z6841 Body Mass Index (BMI) 40.0 and over, adult: Secondary | ICD-10-CM | POA: Insufficient documentation

## 2023-12-10 DIAGNOSIS — T8459XA Infection and inflammatory reaction due to other internal joint prosthesis, initial encounter: Secondary | ICD-10-CM | POA: Insufficient documentation

## 2023-12-10 DIAGNOSIS — Z96659 Presence of unspecified artificial knee joint: Secondary | ICD-10-CM | POA: Insufficient documentation

## 2023-12-10 DIAGNOSIS — K219 Gastro-esophageal reflux disease without esophagitis: Secondary | ICD-10-CM | POA: Insufficient documentation

## 2023-12-10 DIAGNOSIS — Z79899 Other long term (current) drug therapy: Secondary | ICD-10-CM | POA: Insufficient documentation

## 2023-12-10 DIAGNOSIS — I493 Ventricular premature depolarization: Secondary | ICD-10-CM | POA: Insufficient documentation

## 2023-12-10 DIAGNOSIS — M359 Systemic involvement of connective tissue, unspecified: Secondary | ICD-10-CM | POA: Insufficient documentation

## 2023-12-10 DIAGNOSIS — K449 Diaphragmatic hernia without obstruction or gangrene: Secondary | ICD-10-CM | POA: Insufficient documentation

## 2023-12-10 DIAGNOSIS — I5032 Chronic diastolic (congestive) heart failure: Secondary | ICD-10-CM | POA: Insufficient documentation

## 2023-12-10 DIAGNOSIS — R7303 Prediabetes: Secondary | ICD-10-CM | POA: Insufficient documentation

## 2023-12-10 DIAGNOSIS — E7849 Other hyperlipidemia: Secondary | ICD-10-CM

## 2023-12-10 HISTORY — DX: Malignant (primary) neoplasm, unspecified: C80.1

## 2023-12-10 HISTORY — DX: Chronic kidney disease, unspecified: N18.9

## 2023-12-10 HISTORY — DX: Anemia, unspecified: D64.9

## 2023-12-10 LAB — CBC
HCT: 38.1 % (ref 36.0–46.0)
Hemoglobin: 11.8 g/dL — ABNORMAL LOW (ref 12.0–15.0)
MCH: 27.7 pg (ref 26.0–34.0)
MCHC: 31 g/dL (ref 30.0–36.0)
MCV: 89.4 fL (ref 80.0–100.0)
Platelets: 243 10*3/uL (ref 150–400)
RBC: 4.26 MIL/uL (ref 3.87–5.11)
RDW: 14.6 % (ref 11.5–15.5)
WBC: 6.7 10*3/uL (ref 4.0–10.5)
nRBC: 0 % (ref 0.0–0.2)

## 2023-12-10 LAB — COMPREHENSIVE METABOLIC PANEL WITH GFR
ALT: 12 U/L (ref 0–44)
AST: 16 U/L (ref 15–41)
Albumin: 3.4 g/dL — ABNORMAL LOW (ref 3.5–5.0)
Alkaline Phosphatase: 88 U/L (ref 38–126)
Anion gap: 9 (ref 5–15)
BUN: 21 mg/dL (ref 8–23)
CO2: 24 mmol/L (ref 22–32)
Calcium: 9 mg/dL (ref 8.9–10.3)
Chloride: 104 mmol/L (ref 98–111)
Creatinine, Ser: 1 mg/dL (ref 0.44–1.00)
GFR, Estimated: >60 mL/min (ref >60)
Glucose, Bld: 112 mg/dL — ABNORMAL HIGH (ref 70–99)
Potassium: 4.3 mmol/L (ref 3.5–5.1)
Sodium: 137 mmol/L (ref 135–145)
Total Bilirubin: 0.6 mg/dL (ref 0.0–1.2)
Total Protein: 7.2 g/dL (ref 6.5–8.1)

## 2023-12-10 NOTE — Telephone Encounter (Signed)
 Copied from CRM [id number]. Topic: Clinical - Medical Advice >> Dec 10, 2023  9:39 AM Roseanne Cones wrote: Reason for CRM: Patient is scheduled for an open surgery with an orthopaedic surgeon for her knee in 2 days - the patient would like to know if we have any concerns or need to do any type of clearance for her. Patient sees Dr. Gaynell Keeler.  Spoke with patient regarding prior message.Patient is having surgery on 12/12/23 with Dr.Alusio .  Dr.Olalere can you please advise .  Thank you

## 2023-12-10 NOTE — Telephone Encounter (Signed)
 Patient contacted office, stating she will be having knee surgery on 4/13  Was recently seen in the pulmonary office for an abnormal PFT, isolated reduction in diffusing capacity She does have shortness of breath on exertion  History of rheumatoid arthritis, scoliosis  No acute changes in recent symptoms  She is cleared to have surgery from a pulmonary perspective, no acutely reversible problems  Mild risk of respiratory decompensation perioperatively, benefits of surgery outweighs risks at present  May contact the office if any further information is needed  [facility]

## 2023-12-11 ENCOUNTER — Encounter (HOSPITAL_COMMUNITY): Payer: Self-pay

## 2023-12-11 NOTE — Progress Notes (Signed)
 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 PMH of HFpEF, PVCs, moderate OSA (uses CPAP), hiatal hernia, GERD, prediabetes, anemia, neuropathy, fibromyalgia, RA, Sjogeren's syndrome, arthritis, obesity (BMI 41)  Patient follows with Cardiology for management of HFpEF and high calcium  score (however normal coronaries by cath in 2020). Last seen by Dr. Micael Adas on 10/29/23. She is tolerating CPAP. She has chronic LE edema and SOB that is stable. BP controlled. Takes Amiodarone  for high PVC burden. Advised f/u in 1 year.  She follows with Pulmonology for chronic SOB. Seen on 11/19/23 by Dr. Gaynell Keeler. There is some concern for amiodarone  induced lung injury.  Recently had a pulmonary function test showing an isolated reduction in diffusing capacity of 27%. SOB thought to be multifactorial. Cleared for surgery from pulm perspective: "She is cleared to have surgery from a pulmonary perspective, no acutely reversible problems. Mild risk of respiratory decompensation perioperatively, benefits of surgery outweighs risks at present"  LD Zepbound: hasn't started yet, VS: BP 128/60 Comment: right arm sitting  Pulse 62   Temp 37 C (Oral)   Resp 20   Ht 5\' 1"  (1.549 m)   Wt 98.9 kg   SpO2 100%   BMI 41.19 kg/m   PROVIDERS: Laneta Pintos, MD   LABS: Labs reviewed: Acceptable for surgery. (all labs ordered are listed, but only abnormal results are displayed)  Labs Reviewed  COMPREHENSIVE METABOLIC PANEL WITH GFR - Abnormal; Notable for the following components:      Result Value   Glucose, Bld  112 (*)    Albumin 3.4 (*)    All other components within normal limits  CBC - Abnormal; Notable for the following components:   Hemoglobin 11.8 (*)    All other components within normal limits     IMAGES:  CT Chest 11/30/23:  IMPRESSION: 1. No evidence of interstitial lung disease. Mild air trapping is indicative of small airways disease. 2. Borderline tracheobronchomalacia. 3. Right adrenal myelolipoma. 4. Aortic atherosclerosis (ICD10-I70.0). Coronary artery calcification. 5. Enlarged pulmonic trunk, indicative of pulmonary arterial hypertension.   EKG 10/29/23 Normal sinus rhythm, rate 68 Incomplete right bundle branch block  CV: Echo 11/15/2021:  IMPRESSIONS     1. Left ventricular ejection fraction, by estimation, is 60 to 65%. The  left ventricle has normal function. The left ventricle has no regional  wall motion abnormalities. There is mild concentric left ventricular  hypertrophy. Left ventricular diastolic  parameters are consistent with Grade I diastolic dysfunction (impaired  relaxation).   2. Right ventricular systolic function is normal. The right ventricular  size is normal.   3. The mitral valve is normal in structure. Trivial mitral valve  regurgitation.   4. The aortic valve is tricuspid. There is mild calcification of the  aortic valve. There is mild thickening of the aortic valve. Aortic valve  regurgitation is not visualized. Aortic valve sclerosis/calcification is  present, without any evidence of  aortic stenosis.   Comparison(s): Compared to prior TTE in 2020, there is no significant  change.    CT calcium  score 11/15/2021:  IMPRESSION:  Coronary calcium  score of 856. This was 97th percentile for age-, race-, and sex-matched controls.  Cardiac monitor 10/31/2021:  Predominant rhythm was normal sinus rhythm with average heart rate 76bpm and ranged from 61 to 132bpm. Frequent PVCs, ventricular couplets and triplets, bigeminal and trigeminal  PVCs with PVC load 16%. Occasional PACs and nonsustained atrial tachycardia with longest run for 4 beats and fastest run 132bpm.  R/LHC 07/25/2019:  Assessment: 1. Normal coronaries 2. Normal LV function by echo 3. Normal hemodynamics 4. Cath complicated by significant arterial spasm and bradycardia (possibly due to verapamil )   Plan/Discussion:   Normal cath. Suspect dyspnea related to her obesity primarily.    Past Medical History:  Diagnosis Date   Agatston coronary artery calcium  score greater than 400 05/2022   coronary Ca score 856   Anemia    Back pain    Blood transfusion    1981   Cancer (HCC)    basal cell carcinoma, x 3   on leg and bottom of spine   Chronic diastolic CHF (congestive heart failure) (HCC)    diastolic    Chronic kidney disease    CKD3a   COVID    DDD (degenerative disc disease), cervical    DDD (degenerative disc disease), lumbar    Family history of coronary arteriosclerosis- strong fam h/o CAD and early CAD.  07/17/2017   Fibromyalgia    GERD (gastroesophageal reflux disease)    Heart murmur    as a child   Hiatal hernia    sjorgens syndrome   High blood pressure    High cholesterol    IBS (irritable bowel syndrome)    OSA (obstructive sleep apnea)    Osteoarthritis    Peripheral autonomic neuropathy of unknown cause    Pneumonia    Pre-diabetes    PVC (premature ventricular contraction)    Raynaud disease    Rheumatoid arthritis (HCC)    Sjogren's disease (HCC)    Urticaria    Vitamin D  deficiency     Past Surgical History:  Procedure Laterality Date   ABDOMINAL HYSTERECTOMY     BTL, BSO   ADENOIDECTOMY     BREAST EXCISIONAL BIOPSY Left    BREAST SURGERY     mass removal    CHOLECYSTECTOMY     dental implants     DIAGNOSTIC LAPAROSCOPY     x3   FEMUR FRACTURE SURGERY Right    KNEE ARTHROSCOPY     x2   MASS EXCISION Left 03/22/2017   Procedure: EXCISION OF LEFT BREAST MASS;  Surgeon: Dareen Ebbing, MD;  Location: [facility];  Service: General;  Laterality: Left;   MULTIPLE TOOTH EXTRACTIONS  06/2023   x5   ORIF FEMUR FRACTURE Right 06/22/2022   Procedure: RIGHT OPEN REDUCTION INTERNAL FIXATION (ORIF) DISTAL FEMUR FRACTURE;  Surgeon: Hardy Lia, MD;  Location: [facility];  Service: Orthopedics;  Laterality: Right;   patotid cystectomy     RIGHT/LEFT HEART CATH AND CORONARY ANGIOGRAPHY N/A 07/25/2019   Procedure: RIGHT/LEFT HEART CATH AND CORONARY ANGIOGRAPHY;  Surgeon: Mardell Shade, MD;  Location: [facility];  Service: Cardiovascular;  Laterality: N/A;   TENDON REPAIR  1980   left ankle and tibia   TONSILLECTOMY     TOTAL KNEE ARTHROPLASTY  06/21/2011   Procedure: TOTAL KNEE ARTHROPLASTY;  Surgeon: Aurther Blue;  Location: [facility];  Service: Orthopedics;  Laterality: Right;   TUBAL LIGATION      MEDICATIONS:  amiodarone  (PACERONE ) 200 MG  tablet   aspirin  EC 81 MG tablet   cetirizine  (ZYRTEC ) 10 MG tablet   colestipol  (COLESTID ) 1 g tablet   cycloSPORINE  (RESTASIS ) 0.05 % ophthalmic emulsion   diphenoxylate-atropine  (LOMOTIL) 2.5-0.025 MG per tablet   doxylamine , Sleep, (UNISOM ) 25 MG tablet   fenofibrate  (TRICOR ) 48 MG tablet   fluticasone  (FLONASE ) 50 MCG/ACT nasal spray   folic acid  (FOLVITE ) 1 MG tablet   gabapentin  (NEURONTIN ) 300 MG capsule   hydroxychloroquine  (PLAQUENIL ) 200 MG tablet   hyoscyamine  (ANASPAZ ) 0.125 MG TBDP disintergrating tablet   ibuprofen  (ADVIL ) 800 MG tablet   LORazepam  (ATIVAN ) 0.5 MG tablet   methocarbamol  (ROBAXIN ) 500 MG tablet   metoprolol  succinate (TOPROL -XL) 25 MG 24 hr tablet   Multiple Vitamin (MULTIVITAMIN WITH MINERALS) TABS tablet   NONFORMULARY OR COMPOUNDED ITEM   nystatin  (MYCOSTATIN /NYSTOP ) powder   nystatin  cream (MYCOSTATIN )   omeprazole (PRILOSEC) 40 MG capsule   potassium chloride  (KLOR-CON ) 10 MEQ tablet   promethazine  (PHENERGAN ) 25 MG tablet   rosuvastatin  (CRESTOR ) 10 MG tablet   spironolactone  (ALDACTONE ) 25 MG tablet    tirzepatide (ZEPBOUND) 2.5 MG/0.5ML injection vial   torsemide  40 MG TABS   No current facility-administered medications for this encounter.    Antoinette Kirschner [facility] Short Stay/Anesthesiology [facility] Phone [phone] 12/11/2023 9:03 AM

## 2023-12-11 NOTE — Anesthesia Preprocedure Evaluation (Signed)
 Anesthesia Evaluation    Airway        Dental   Pulmonary           Cardiovascular hypertension,      Neuro/Psych    GI/Hepatic   Endo/Other    Renal/GU      Musculoskeletal   Abdominal   Peds  Hematology   Anesthesia Other Findings   Reproductive/Obstetrics                              Anesthesia Physical Anesthesia Plan  ASA:   Anesthesia Plan:    Post-op Pain Management:    Induction:   PONV Risk Score and Plan:   Airway Management Planned:   Additional Equipment:   Intra-op Plan:   Post-operative Plan:   Informed Consent:   Plan Discussed with:   Anesthesia Plan Comments: (See PAT note from 4/28)         Anesthesia Quick Evaluation  12/10/2023                BUN                      21                  12/10/2023                CREATININE               1.00                12/10/2023                CALCIUM                   9.0                 12/10/2023                GFR                      72.81               04/27/2015                EGFR                     42 (L)              10/23/2023                GFRNONAA                 >60                 12/10/2023                Musculoskeletal  (+) Arthritis ,  Fibromyalgia -  Abdominal   Peds  Hematology  (+) Blood dyscrasia, anemia Lab Results      Component                Value               Date                      WBC                      6.7                 12/10/2023                HGB                      11.8 (L)            12/10/2023                HCT                      38.1                12/10/2023                MCV                      89.4  12/10/2023                PLT                      243                 12/10/2023              Anesthesia Other Findings   Reproductive/Obstetrics                             Anesthesia Physical Anesthesia Plan  ASA: 3  Anesthesia Plan: General   Post-op Pain Management:    Induction: Intravenous  PONV Risk Score and Plan: 4 or greater and Ondansetron  and Dexamethasone   Airway Management Planned: LMA and Oral ETT  Additional Equipment: None  Intra-op Plan:   Post-operative Plan: Extubation in OR  Informed Consent: I have reviewed the patients  History and Physical, chart, labs and discussed the procedure including the risks, benefits and alternatives for the proposed anesthesia with the patient or authorized representative who has indicated his/her understanding and acceptance.     Dental advisory given  Plan Discussed with: CRNA  Anesthesia Plan Comments: (See PAT note from 4/28)        Anesthesia Quick Evaluation

## 2023-12-12 ENCOUNTER — Inpatient Hospital Stay (HOSPITAL_COMMUNITY): Payer: Self-pay | Admitting: Medical

## 2023-12-12 ENCOUNTER — Encounter (HOSPITAL_COMMUNITY): Payer: Self-pay | Admitting: Orthopedic Surgery

## 2023-12-12 ENCOUNTER — Other Ambulatory Visit: Admitting: Rheumatology

## 2023-12-12 ENCOUNTER — Inpatient Hospital Stay (HOSPITAL_COMMUNITY): Admitting: Anesthesiology

## 2023-12-12 ENCOUNTER — Inpatient Hospital Stay (HOSPITAL_COMMUNITY)
Admission: RE | Admit: 2023-12-12 | Discharge: 2023-12-15 | DRG: 464 | Disposition: A | Discharge status: Home Health | Attending: Orthopedic Surgery | Admitting: Orthopedic Surgery

## 2023-12-12 ENCOUNTER — Other Ambulatory Visit: Payer: Self-pay

## 2023-12-12 ENCOUNTER — Encounter (HOSPITAL_COMMUNITY)
Admission: RE | Disposition: A | Discharge status: Home Health | Payer: Self-pay | Source: Home / Self Care | Attending: Orthopedic Surgery

## 2023-12-12 DIAGNOSIS — I110 Hypertensive heart disease with heart failure: Secondary | ICD-10-CM

## 2023-12-12 DIAGNOSIS — M1712 Unilateral primary osteoarthritis, left knee: Secondary | ICD-10-CM | POA: Diagnosis present

## 2023-12-12 DIAGNOSIS — Z85828 Personal history of other malignant neoplasm of skin: Secondary | ICD-10-CM | POA: Diagnosis not present

## 2023-12-12 DIAGNOSIS — M069 Rheumatoid arthritis, unspecified: Secondary | ICD-10-CM | POA: Diagnosis not present

## 2023-12-12 DIAGNOSIS — R7303 Prediabetes: Secondary | ICD-10-CM | POA: Diagnosis present

## 2023-12-12 DIAGNOSIS — E559 Vitamin D deficiency, unspecified: Secondary | ICD-10-CM | POA: Diagnosis not present

## 2023-12-12 DIAGNOSIS — K589 Irritable bowel syndrome without diarrhea: Secondary | ICD-10-CM | POA: Diagnosis not present

## 2023-12-12 DIAGNOSIS — Z83438 Family history of other disorder of lipoprotein metabolism and other lipidemia: Secondary | ICD-10-CM

## 2023-12-12 DIAGNOSIS — I5032 Chronic diastolic (congestive) heart failure: Secondary | ICD-10-CM | POA: Diagnosis present

## 2023-12-12 DIAGNOSIS — K449 Diaphragmatic hernia without obstruction or gangrene: Secondary | ICD-10-CM | POA: Diagnosis not present

## 2023-12-12 DIAGNOSIS — I509 Heart failure, unspecified: Secondary | ICD-10-CM

## 2023-12-12 DIAGNOSIS — Z825 Family history of asthma and other chronic lower respiratory diseases: Secondary | ICD-10-CM | POA: Diagnosis not present

## 2023-12-12 DIAGNOSIS — Z96651 Presence of right artificial knee joint: Secondary | ICD-10-CM | POA: Diagnosis not present

## 2023-12-12 DIAGNOSIS — L299 Pruritus, unspecified: Secondary | ICD-10-CM

## 2023-12-12 DIAGNOSIS — Z8616 Personal history of COVID-19: Secondary | ICD-10-CM | POA: Diagnosis not present

## 2023-12-12 DIAGNOSIS — K2100 Gastro-esophageal reflux disease with esophagitis, without bleeding: Secondary | ICD-10-CM | POA: Diagnosis not present

## 2023-12-12 DIAGNOSIS — D631 Anemia in chronic kidney disease: Secondary | ICD-10-CM | POA: Diagnosis not present

## 2023-12-12 DIAGNOSIS — Y831 Surgical operation with implant of artificial internal device as the cause of abnormal reaction of the patient, or of later complication, without mention of misadventure at the time of the procedure: Secondary | ICD-10-CM | POA: Diagnosis present

## 2023-12-12 DIAGNOSIS — Z6841 Body Mass Index (BMI) 40.0 and over, adult: Secondary | ICD-10-CM

## 2023-12-12 DIAGNOSIS — J8410 Pulmonary fibrosis, unspecified: Secondary | ICD-10-CM | POA: Diagnosis not present

## 2023-12-12 DIAGNOSIS — M5030 Other cervical disc degeneration, unspecified cervical region: Secondary | ICD-10-CM | POA: Diagnosis present

## 2023-12-12 DIAGNOSIS — M47816 Spondylosis without myelopathy or radiculopathy, lumbar region: Secondary | ICD-10-CM | POA: Diagnosis not present

## 2023-12-12 DIAGNOSIS — Z8249 Family history of ischemic heart disease and other diseases of the circulatory system: Secondary | ICD-10-CM

## 2023-12-12 DIAGNOSIS — Z9049 Acquired absence of other specified parts of digestive tract: Secondary | ICD-10-CM

## 2023-12-12 DIAGNOSIS — Z820 Family history of epilepsy and other diseases of the nervous system: Secondary | ICD-10-CM

## 2023-12-12 DIAGNOSIS — Z967 Presence of other bone and tendon implants: Secondary | ICD-10-CM | POA: Diagnosis present

## 2023-12-12 DIAGNOSIS — I7300 Raynaud's syndrome without gangrene: Secondary | ICD-10-CM | POA: Diagnosis present

## 2023-12-12 DIAGNOSIS — Z888 Allergy status to other drugs, medicaments and biological substances status: Secondary | ICD-10-CM

## 2023-12-12 DIAGNOSIS — Z7982 Long term (current) use of aspirin: Secondary | ICD-10-CM

## 2023-12-12 DIAGNOSIS — M51379 Other intervertebral disc degeneration, lumbosacral region without mention of lumbar back pain or lower extremity pain: Secondary | ICD-10-CM | POA: Diagnosis not present

## 2023-12-12 DIAGNOSIS — Z882 Allergy status to sulfonamides status: Secondary | ICD-10-CM

## 2023-12-12 DIAGNOSIS — G4733 Obstructive sleep apnea (adult) (pediatric): Secondary | ICD-10-CM | POA: Diagnosis not present

## 2023-12-12 DIAGNOSIS — N1832 Chronic kidney disease, stage 3b: Secondary | ICD-10-CM | POA: Diagnosis not present

## 2023-12-12 DIAGNOSIS — Z91048 Other nonmedicinal substance allergy status: Secondary | ICD-10-CM

## 2023-12-12 DIAGNOSIS — E66812 Obesity, class 2: Secondary | ICD-10-CM | POA: Diagnosis not present

## 2023-12-12 DIAGNOSIS — D509 Iron deficiency anemia, unspecified: Secondary | ICD-10-CM | POA: Diagnosis not present

## 2023-12-12 DIAGNOSIS — M9711XD Periprosthetic fracture around internal prosthetic right knee joint, subsequent encounter: Secondary | ICD-10-CM | POA: Diagnosis not present

## 2023-12-12 DIAGNOSIS — I493 Ventricular premature depolarization: Secondary | ICD-10-CM | POA: Diagnosis not present

## 2023-12-12 DIAGNOSIS — F419 Anxiety disorder, unspecified: Secondary | ICD-10-CM | POA: Diagnosis not present

## 2023-12-12 DIAGNOSIS — M3500 Sicca syndrome, unspecified: Secondary | ICD-10-CM | POA: Diagnosis not present

## 2023-12-12 DIAGNOSIS — T462X5D Adverse effect of other antidysrhythmic drugs, subsequent encounter: Secondary | ICD-10-CM

## 2023-12-12 DIAGNOSIS — G629 Polyneuropathy, unspecified: Secondary | ICD-10-CM | POA: Diagnosis present

## 2023-12-12 DIAGNOSIS — R269 Unspecified abnormalities of gait and mobility: Secondary | ICD-10-CM | POA: Diagnosis present

## 2023-12-12 DIAGNOSIS — B952 Enterococcus as the cause of diseases classified elsewhere: Secondary | ICD-10-CM | POA: Diagnosis not present

## 2023-12-12 DIAGNOSIS — J703 Chronic drug-induced interstitial lung disorders: Secondary | ICD-10-CM | POA: Diagnosis not present

## 2023-12-12 DIAGNOSIS — K219 Gastro-esophageal reflux disease without esophagitis: Secondary | ICD-10-CM | POA: Diagnosis not present

## 2023-12-12 DIAGNOSIS — Z90710 Acquired absence of both cervix and uterus: Secondary | ICD-10-CM

## 2023-12-12 DIAGNOSIS — T8459XA Infection and inflammatory reaction due to other internal joint prosthesis, initial encounter: Secondary | ICD-10-CM | POA: Diagnosis present

## 2023-12-12 DIAGNOSIS — M797 Fibromyalgia: Secondary | ICD-10-CM | POA: Diagnosis present

## 2023-12-12 DIAGNOSIS — E782 Mixed hyperlipidemia: Secondary | ICD-10-CM | POA: Diagnosis present

## 2023-12-12 DIAGNOSIS — Z83511 Family history of glaucoma: Secondary | ICD-10-CM

## 2023-12-12 DIAGNOSIS — N1831 Chronic kidney disease, stage 3a: Secondary | ICD-10-CM | POA: Diagnosis not present

## 2023-12-12 DIAGNOSIS — M51369 Other intervertebral disc degeneration, lumbar region without mention of lumbar back pain or lower extremity pain: Secondary | ICD-10-CM | POA: Diagnosis not present

## 2023-12-12 DIAGNOSIS — I130 Hypertensive heart and chronic kidney disease with heart failure and stage 1 through stage 4 chronic kidney disease, or unspecified chronic kidney disease: Secondary | ICD-10-CM | POA: Diagnosis present

## 2023-12-12 DIAGNOSIS — S82001D Unspecified fracture of right patella, subsequent encounter for closed fracture with routine healing: Secondary | ICD-10-CM | POA: Diagnosis not present

## 2023-12-12 DIAGNOSIS — Z8701 Personal history of pneumonia (recurrent): Secondary | ICD-10-CM

## 2023-12-12 DIAGNOSIS — Z885 Allergy status to narcotic agent status: Secondary | ICD-10-CM

## 2023-12-12 DIAGNOSIS — G4730 Sleep apnea, unspecified: Secondary | ICD-10-CM

## 2023-12-12 DIAGNOSIS — Z79899 Other long term (current) drug therapy: Secondary | ICD-10-CM

## 2023-12-12 DIAGNOSIS — T8453XA Infection and inflammatory reaction due to internal right knee prosthesis, initial encounter: Principal | ICD-10-CM | POA: Diagnosis present

## 2023-12-12 DIAGNOSIS — Z7985 Long-term (current) use of injectable non-insulin antidiabetic drugs: Secondary | ICD-10-CM

## 2023-12-12 DIAGNOSIS — J320 Chronic maxillary sinusitis: Secondary | ICD-10-CM | POA: Diagnosis present

## 2023-12-12 DIAGNOSIS — Z91041 Radiographic dye allergy status: Secondary | ICD-10-CM

## 2023-12-12 HISTORY — PX: I & D KNEE WITH POLY EXCHANGE: SHX5024

## 2023-12-12 SURGERY — IRRIGATION AND DEBRIDEMENT KNEE WITH POLY EXCHANGE
Anesthesia: General | Site: Knee | Laterality: Right

## 2023-12-12 MED ORDER — ORAL CARE MOUTH RINSE: 15.0000 mL | Freq: Once | OROMUCOSAL | Status: DC

## 2023-12-12 MED ORDER — FENTANYL CITRATE PF 50 MCG/ML IJ SOSY
100.0000 ug | PREFILLED_SYRINGE | Freq: Once | INTRAMUSCULAR | Status: AC
  Administered 2023-12-12 (×2): 50 ug via INTRAVENOUS

## 2023-12-12 MED ORDER — DEXAMETHASONE SODIUM PHOSPHATE 4 MG/ML IJ SOLN
INTRAMUSCULAR | Status: DC | PRN
  Administered 2023-12-12: 4 mg via INTRAVENOUS

## 2023-12-12 MED ORDER — LORATADINE 10 MG PO TABS
10.0000 mg | ORAL_TABLET | Freq: Every day | ORAL | Status: DC
  Administered 2023-12-13 – 2023-12-15 (×3): 10 mg via ORAL
  Filled 2023-12-12 (×3): qty 1

## 2023-12-12 MED ORDER — ACETAMINOPHEN 500 MG PO TABS
1000.0000 mg | ORAL_TABLET | Freq: Four times a day (QID) | ORAL | Status: AC
  Administered 2023-12-12 – 2023-12-13 (×4): 1000 mg via ORAL
  Filled 2023-12-12 (×4): qty 2

## 2023-12-12 MED ORDER — METHOCARBAMOL 1000 MG/10ML IJ SOLN
500.0000 mg | Freq: Four times a day (QID) | INTRAMUSCULAR | Status: DC | PRN
  Administered 2023-12-12: 500 mg via INTRAVENOUS

## 2023-12-12 MED ORDER — ACETAMINOPHEN 325 MG PO TABS
325.0000 mg | ORAL_TABLET | Freq: Four times a day (QID) | ORAL | Status: DC | PRN
  Administered 2023-12-14: 650 mg via ORAL
  Filled 2023-12-12: qty 2

## 2023-12-12 MED ORDER — DIPHENHYDRAMINE HCL 12.5 MG/5ML PO ELIX
12.5000 mg | ORAL_SOLUTION | ORAL | Status: DC | PRN
  Administered 2023-12-14 (×3): 25 mg via ORAL
  Filled 2023-12-12 (×3): qty 10

## 2023-12-12 MED ORDER — PANTOPRAZOLE SODIUM 40 MG PO TBEC
80.0000 mg | DELAYED_RELEASE_TABLET | Freq: Every day | ORAL | Status: DC
  Administered 2023-12-13 – 2023-12-15 (×3): 80 mg via ORAL
  Filled 2023-12-12 (×3): qty 2

## 2023-12-12 MED ORDER — ONDANSETRON HCL 4 MG/2ML IJ SOLN
INTRAMUSCULAR | Status: AC
  Filled 2023-12-12: qty 2

## 2023-12-12 MED ORDER — ROSUVASTATIN CALCIUM 10 MG PO TABS
10.0000 mg | ORAL_TABLET | Freq: Every day | ORAL | Status: DC
  Administered 2023-12-13 – 2023-12-15 (×3): 10 mg via ORAL
  Filled 2023-12-12 (×3): qty 1

## 2023-12-12 MED ORDER — ASPIRIN 81 MG PO CHEW
81.0000 mg | CHEWABLE_TABLET | Freq: Two times a day (BID) | ORAL | Status: DC
  Administered 2023-12-13 – 2023-12-14 (×3): 81 mg via ORAL
  Filled 2023-12-12 (×4): qty 1

## 2023-12-12 MED ORDER — PROMETHAZINE HCL 25 MG PO TABS
25.0000 mg | ORAL_TABLET | Freq: Four times a day (QID) | ORAL | Status: DC | PRN
Start: 2023-12-12 — End: 2023-12-15

## 2023-12-12 MED ORDER — SODIUM CHLORIDE 0.9 % IV SOLN: INTRAVENOUS | Status: DC

## 2023-12-12 MED ORDER — CHLORHEXIDINE GLUCONATE 0.12 % MT SOLN: 15.0000 mL | Freq: Once | OROMUCOSAL | Status: DC

## 2023-12-12 MED ORDER — FLEET ENEMA RE ENEM: 1.0000 | ENEMA | Freq: Once | RECTAL | Status: DC | PRN

## 2023-12-12 MED ORDER — EPHEDRINE SULFATE-NACL 50-0.9 MG/10ML-% IV SOSY
PREFILLED_SYRINGE | INTRAVENOUS | Status: DC | PRN
  Administered 2023-12-12 (×2): 10 mg via INTRAVENOUS

## 2023-12-12 MED ORDER — TRANEXAMIC ACID-NACL 1000-0.7 MG/100ML-% IV SOLN
1000.0000 mg | INTRAVENOUS | Status: AC
  Administered 2023-12-12: 1000 mg via INTRAVENOUS
  Filled 2023-12-12: qty 100

## 2023-12-12 MED ORDER — ONDANSETRON HCL 4 MG/2ML IJ SOLN
INTRAMUSCULAR | Status: DC | PRN
  Administered 2023-12-12: 4 mg via INTRAVENOUS

## 2023-12-12 MED ORDER — HYDROMORPHONE HCL 2 MG/ML IJ SOLN
INTRAMUSCULAR | Status: AC
  Filled 2023-12-12: qty 1

## 2023-12-12 MED ORDER — FENOFIBRATE 54 MG PO TABS
54.0000 mg | ORAL_TABLET | Freq: Every day | ORAL | Status: DC
  Administered 2023-12-13 – 2023-12-15 (×3): 54 mg via ORAL
  Filled 2023-12-12 (×3): qty 1

## 2023-12-12 MED ORDER — BISACODYL 10 MG RE SUPP: 10.0000 mg | Freq: Every day | RECTAL | Status: DC | PRN

## 2023-12-12 MED ORDER — CEFAZOLIN SODIUM-DEXTROSE 2-4 GM/100ML-% IV SOLN
2.0000 g | INTRAVENOUS | Status: AC
  Administered 2023-12-12: 2 g via INTRAVENOUS
  Filled 2023-12-12: qty 100

## 2023-12-12 MED ORDER — TRAMADOL HCL 50 MG PO TABS
50.0000 mg | ORAL_TABLET | Freq: Four times a day (QID) | ORAL | Status: DC | PRN
  Administered 2023-12-12: 100 mg via ORAL
  Filled 2023-12-12: qty 2

## 2023-12-12 MED ORDER — HYDROXYCHLOROQUINE SULFATE 200 MG PO TABS
200.0000 mg | ORAL_TABLET | Freq: Every day | ORAL | Status: DC
  Filled 2023-12-12 (×3): qty 1

## 2023-12-12 MED ORDER — DEXAMETHASONE SODIUM PHOSPHATE 10 MG/ML IJ SOLN
10.0000 mg | Freq: Once | INTRAMUSCULAR | Status: AC
  Administered 2023-12-13: 10 mg via INTRAVENOUS
  Filled 2023-12-12: qty 1

## 2023-12-12 MED ORDER — MENTHOL 3 MG MT LOZG: 1.0000 | LOZENGE | OROMUCOSAL | Status: DC | PRN

## 2023-12-12 MED ORDER — POLYETHYLENE GLYCOL 3350 17 G PO PACK: 17.0000 g | PACK | Freq: Every day | ORAL | Status: DC | PRN

## 2023-12-12 MED ORDER — PROPOFOL 10 MG/ML IV BOLUS
INTRAVENOUS | Status: DC | PRN
  Administered 2023-12-12: 140 mg via INTRAVENOUS

## 2023-12-12 MED ORDER — POTASSIUM CHLORIDE ER 10 MEQ PO TBCR
20.0000 meq | EXTENDED_RELEASE_TABLET | Freq: Every day | ORAL | Status: DC
  Administered 2023-12-13 – 2023-12-15 (×3): 20 meq via ORAL
  Filled 2023-12-12 (×6): qty 2

## 2023-12-12 MED ORDER — EPHEDRINE 5 MG/ML INJ
INTRAVENOUS | Status: AC
  Filled 2023-12-12: qty 5

## 2023-12-12 MED ORDER — FENTANYL CITRATE PF 50 MCG/ML IJ SOSY
25.0000 ug | PREFILLED_SYRINGE | INTRAMUSCULAR | Status: DC | PRN
  Administered 2023-12-12 (×3): 50 ug via INTRAVENOUS

## 2023-12-12 MED ORDER — LIDOCAINE HCL (CARDIAC) PF 100 MG/5ML IV SOSY
PREFILLED_SYRINGE | INTRAVENOUS | Status: DC | PRN
  Administered 2023-12-12: 60 mg via INTRATRACHEAL

## 2023-12-12 MED ORDER — GABAPENTIN 300 MG PO CAPS
600.0000 mg | ORAL_CAPSULE | Freq: Every day | ORAL | Status: DC
  Administered 2023-12-12 – 2023-12-14 (×3): 600 mg via ORAL
  Filled 2023-12-12 (×3): qty 2

## 2023-12-12 MED ORDER — METHOCARBAMOL 500 MG PO TABS
500.0000 mg | ORAL_TABLET | Freq: Four times a day (QID) | ORAL | Status: DC | PRN
  Administered 2023-12-12 – 2023-12-15 (×7): 500 mg via ORAL
  Filled 2023-12-12 (×7): qty 1

## 2023-12-12 MED ORDER — LACTATED RINGERS IV SOLN: INTRAVENOUS | Status: DC

## 2023-12-12 MED ORDER — OXYCODONE HCL 5 MG PO TABS
5.0000 mg | ORAL_TABLET | ORAL | Status: DC | PRN
  Administered 2023-12-12 – 2023-12-13 (×5): 10 mg via ORAL
  Administered 2023-12-14: 5 mg via ORAL
  Administered 2023-12-14 (×3): 10 mg via ORAL
  Administered 2023-12-15 (×3): 5 mg via ORAL
  Filled 2023-12-12 (×2): qty 1
  Filled 2023-12-12: qty 2
  Filled 2023-12-12: qty 1
  Filled 2023-12-12 (×2): qty 2
  Filled 2023-12-12: qty 1
  Filled 2023-12-12: qty 2
  Filled 2023-12-12: qty 1
  Filled 2023-12-12: qty 2
  Filled 2023-12-12 (×2): qty 1
  Filled 2023-12-12 (×2): qty 2

## 2023-12-12 MED ORDER — FENTANYL CITRATE (PF) 100 MCG/2ML IJ SOLN
INTRAMUSCULAR | Status: AC
  Filled 2023-12-12: qty 2

## 2023-12-12 MED ORDER — DEXAMETHASONE SODIUM PHOSPHATE 10 MG/ML IJ SOLN: 8.0000 mg | Freq: Once | INTRAMUSCULAR | Status: DC

## 2023-12-12 MED ORDER — OXYCODONE HCL 5 MG PO TABS
5.0000 mg | ORAL_TABLET | Freq: Once | ORAL | Status: AC | PRN
  Administered 2023-12-12: 5 mg via ORAL

## 2023-12-12 MED ORDER — HYDROMORPHONE HCL 1 MG/ML IJ SOLN
INTRAMUSCULAR | Status: DC | PRN
  Administered 2023-12-12: .6 mg via INTRAVENOUS
  Administered 2023-12-12: .2 mg via INTRAVENOUS
  Administered 2023-12-12 (×2): .4 mg via INTRAVENOUS

## 2023-12-12 MED ORDER — FENTANYL CITRATE PF 50 MCG/ML IJ SOSY
PREFILLED_SYRINGE | INTRAMUSCULAR | Status: AC
  Filled 2023-12-12: qty 1

## 2023-12-12 MED ORDER — HYDROMORPHONE HCL 1 MG/ML IJ SOLN: 0.5000 mg | INTRAMUSCULAR | Status: DC | PRN

## 2023-12-12 MED ORDER — SODIUM CHLORIDE 0.9 % IR SOLN
Status: DC | PRN
Start: 2023-12-12 — End: 2023-12-12
  Administered 2023-12-12: 6000 mL

## 2023-12-12 MED ORDER — SPIRONOLACTONE 12.5 MG HALF TABLET
12.5000 mg | ORAL_TABLET | Freq: Every day | ORAL | Status: DC
  Administered 2023-12-13: 12.5 mg via ORAL
  Filled 2023-12-12 (×3): qty 1

## 2023-12-12 MED ORDER — BUPIVACAINE LIPOSOME 1.3 % IJ SUSP: 20.0000 mL | Freq: Once | INTRAMUSCULAR | Status: DC

## 2023-12-12 MED ORDER — COLESTIPOL HCL 1 G PO TABS
2.0000 g | ORAL_TABLET | Freq: Every day | ORAL | Status: DC
  Administered 2023-12-13: 2 g via ORAL
  Filled 2023-12-12 (×2): qty 2

## 2023-12-12 MED ORDER — OXYCODONE HCL 5 MG/5ML PO SOLN: 5.0000 mg | Freq: Once | ORAL | Status: AC | PRN

## 2023-12-12 MED ORDER — POVIDONE-IODINE 10 % EX SWAB: 2.0000 | Freq: Once | CUTANEOUS | Status: DC

## 2023-12-12 MED ORDER — ONDANSETRON HCL 4 MG PO TABS: 4.0000 mg | ORAL_TABLET | Freq: Four times a day (QID) | ORAL | Status: DC | PRN

## 2023-12-12 MED ORDER — METHOCARBAMOL 1000 MG/10ML IJ SOLN
INTRAMUSCULAR | Status: AC
Start: 2023-12-12 — End: 2023-12-13
  Filled 2023-12-12: qty 10

## 2023-12-12 MED ORDER — DEXAMETHASONE SODIUM PHOSPHATE 10 MG/ML IJ SOLN
INTRAMUSCULAR | Status: AC
  Filled 2023-12-12: qty 1

## 2023-12-12 MED ORDER — GABAPENTIN 300 MG PO CAPS
300.0000 mg | ORAL_CAPSULE | Freq: Every day | ORAL | Status: DC
  Administered 2023-12-13 – 2023-12-15 (×3): 300 mg via ORAL
  Filled 2023-12-12 (×3): qty 1

## 2023-12-12 MED ORDER — ACETAMINOPHEN 10 MG/ML IV SOLN
1000.0000 mg | Freq: Four times a day (QID) | INTRAVENOUS | Status: DC
  Administered 2023-12-12: 1000 mg via INTRAVENOUS
  Filled 2023-12-12: qty 100

## 2023-12-12 MED ORDER — ACETAMINOPHEN 10 MG/ML IV SOLN: 1000.0000 mg | Freq: Once | INTRAVENOUS | Status: DC | PRN

## 2023-12-12 MED ORDER — ONDANSETRON HCL 4 MG/2ML IJ SOLN
4.0000 mg | Freq: Four times a day (QID) | INTRAMUSCULAR | Status: DC | PRN
  Administered 2023-12-12 – 2023-12-14 (×2): 4 mg via INTRAVENOUS
  Filled 2023-12-12 (×2): qty 2

## 2023-12-12 MED ORDER — PHENOL 1.4 % MT LIQD: 1.0000 | OROMUCOSAL | Status: DC | PRN

## 2023-12-12 MED ORDER — MIDAZOLAM HCL 2 MG/2ML IJ SOLN
2.0000 mg | Freq: Once | INTRAMUSCULAR | Status: AC
  Administered 2023-12-12: 2 mg via INTRAVENOUS

## 2023-12-12 MED ORDER — GABAPENTIN 300 MG PO CAPS: 300.0000 mg | ORAL_CAPSULE | ORAL | Status: DC

## 2023-12-12 MED ORDER — CEFAZOLIN SODIUM-DEXTROSE 2-4 GM/100ML-% IV SOLN
2.0000 g | Freq: Four times a day (QID) | INTRAVENOUS | Status: AC
  Administered 2023-12-12 – 2023-12-13 (×2): 2 g via INTRAVENOUS
  Filled 2023-12-12 (×2): qty 100

## 2023-12-12 MED ORDER — MIDAZOLAM HCL 2 MG/2ML IJ SOLN
INTRAMUSCULAR | Status: AC
  Filled 2023-12-12: qty 2

## 2023-12-12 MED ORDER — OXYCODONE HCL 5 MG PO TABS
ORAL_TABLET | ORAL | Status: AC
  Filled 2023-12-12: qty 1

## 2023-12-12 MED ORDER — FENTANYL CITRATE PF 50 MCG/ML IJ SOSY
PREFILLED_SYRINGE | INTRAMUSCULAR | Status: AC
  Filled 2023-12-12: qty 2

## 2023-12-12 MED ORDER — METOPROLOL SUCCINATE ER 25 MG PO TB24
12.5000 mg | ORAL_TABLET | Freq: Every day | ORAL | Status: DC
  Administered 2023-12-13 – 2023-12-14 (×2): 12.5 mg via ORAL
  Filled 2023-12-12 (×2): qty 1

## 2023-12-12 MED ORDER — DOCUSATE SODIUM 100 MG PO CAPS
100.0000 mg | ORAL_CAPSULE | Freq: Two times a day (BID) | ORAL | Status: DC
  Administered 2023-12-12 – 2023-12-13 (×3): 100 mg via ORAL
  Filled 2023-12-12 (×5): qty 1

## 2023-12-12 SURGICAL SUPPLY — 40 items
BAG COUNTER SPONGE SURGICOUNT (BAG) IMPLANT
BAG ZIPLOCK 12X15 (MISCELLANEOUS) ×1 IMPLANT
BNDG ELASTIC 6INX 5YD STR LF (GAUZE/BANDAGES/DRESSINGS) ×1 IMPLANT
COVER SURGICAL LIGHT HANDLE (MISCELLANEOUS) ×1 IMPLANT
CUFF TRNQT CYL 34X4.125X (TOURNIQUET CUFF) ×1 IMPLANT
DRAPE INCISE IOBAN 66X45 STRL (DRAPES) ×3 IMPLANT
DRAPE U-SHAPE 47X51 STRL (DRAPES) ×1 IMPLANT
DRSG ADAPTIC 3X8 NADH LF (GAUZE/BANDAGES/DRESSINGS) ×1 IMPLANT
DURAPREP 26ML APPLICATOR (WOUND CARE) ×1 IMPLANT
ELECT REM PT RETURN 15FT ADLT (MISCELLANEOUS) ×1 IMPLANT
EVACUATOR 1/8 PVC DRAIN (DRAIN) ×1 IMPLANT
GAUZE PAD ABD 8X10 STRL (GAUZE/BANDAGES/DRESSINGS) ×3 IMPLANT
GAUZE SPONGE 4X4 12PLY STRL (GAUZE/BANDAGES/DRESSINGS) ×1 IMPLANT
GLOVE BIO SURGEON STRL SZ 6.5 (GLOVE) ×2 IMPLANT
GLOVE BIO SURGEON STRL SZ7 (GLOVE) ×1 IMPLANT
GLOVE BIO SURGEON STRL SZ8 (GLOVE) ×2 IMPLANT
GLOVE BIOGEL PI IND STRL 6.5 (GLOVE) ×1 IMPLANT
GLOVE BIOGEL PI IND STRL 7.0 (GLOVE) ×3 IMPLANT
GLOVE BIOGEL PI IND STRL 8 (GLOVE) ×1 IMPLANT
GLOVE SURG SS PI 6.5 STRL IVOR (GLOVE) ×1 IMPLANT
GOWN STRL REUS W/ TWL LRG LVL3 (GOWN DISPOSABLE) ×3 IMPLANT
IMMOBILIZER KNEE 20 (SOFTGOODS) ×1 IMPLANT
IMMOBILIZER KNEE 20 THIGH 36 (SOFTGOODS) ×1 IMPLANT
KIT TURNOVER KIT A (KITS) IMPLANT
MANIFOLD NEPTUNE II (INSTRUMENTS) ×1 IMPLANT
NS IRRIG 1000ML POUR BTL (IV SOLUTION) ×1 IMPLANT
PACK TOTAL KNEE CUSTOM (KITS) ×1 IMPLANT
PADDING CAST COTTON 6X4 STRL (CAST SUPPLIES) ×2 IMPLANT
PLATE ROT INSERT 12.5MM SIZE 3 (Plate) IMPLANT
PROTECTOR NERVE ULNAR (MISCELLANEOUS) ×1 IMPLANT
SET HNDPC FAN SPRY TIP SCT (DISPOSABLE) ×1 IMPLANT
STAPLER SKIN PROX WIDE 3.9 (STAPLE) ×1 IMPLANT
STRIP CLOSURE SKIN 1/2X4 (GAUZE/BANDAGES/DRESSINGS) ×2 IMPLANT
SUT MNCRL AB 4-0 PS2 18 (SUTURE) ×1 IMPLANT
SUT VIC AB 2-0 CT1 TAPERPNT 27 (SUTURE) ×3 IMPLANT
SUTURE STRATFX 0 PDS 27 VIOLET (SUTURE) IMPLANT
SWAB COLLECTION DEVICE MRSA (MISCELLANEOUS) ×1 IMPLANT
SWAB CULTURE ESWAB REG 1ML (MISCELLANEOUS) ×1 IMPLANT
WATER STERILE IRR 1000ML POUR (IV SOLUTION) ×1 IMPLANT
WRAP KNEE MAXI GEL POST OP (GAUZE/BANDAGES/DRESSINGS) ×2 IMPLANT

## 2023-12-12 NOTE — Transfer of Care (Signed)
 Immediate Anesthesia Transfer of Care Note  Patient: [name]  Procedure(s) Performed: IRRIGATION AND DEBRIDEMENT KNEE WITH POLY EXCHANGE (Right: Knee)  Patient Location: PACU  Anesthesia Type:General  Level of Consciousness: awake, alert , and oriented  Airway & Oxygen Therapy: Patient Spontanous Breathing and Patient connected to face mask oxygen  Post-op Assessment: Report given to RN and Post -op Vital signs reviewed and stable  Post vital signs: Reviewed and stable  Last Vitals:  Vitals Value Taken Time  BP 113/68 12/12/23 1609  Temp    Pulse 73 12/12/23 1610  Resp 15 12/12/23 1610  SpO2 100 % 12/12/23 1610  Vitals shown include unfiled device data.  Last Pain:  Vitals:   12/12/23 1145  TempSrc:   PainSc: 0-No pain         Complications: No notable events documented.

## 2023-12-12 NOTE — Interval H&P Note (Signed)
 History and Physical Interval Note:  12/12/2023 1:20 PM  [name]  has presented today for surgery, with the diagnosis of Infected Right Total Knee arthroplasty.  The various methods of treatment have been discussed with the patient and family. After consideration of risks, benefits and other options for treatment, the patient has consented to  Procedure(s): IRRIGATION AND DEBRIDEMENT KNEE WITH POLY EXCHANGE (Right) as a surgical intervention.  The patient's history has been reviewed, patient examined, no change in status, stable for surgery.  I have reviewed the patient's chart and labs.  Questions were answered to the patient's satisfaction.     Samuel Crock Noorah Giammona

## 2023-12-12 NOTE — Anesthesia Postprocedure Evaluation (Signed)
 Anesthesia Post Note  Patient: [name]  Procedure(s) Performed: IRRIGATION AND DEBRIDEMENT KNEE WITH POLY EXCHANGE (Right: Knee)     Patient location during evaluation: Nursing Unit Anesthesia Type: General Level of consciousness: awake and patient cooperative Pain management: pain level controlled Vital Signs Assessment: post-procedure vital signs reviewed and stable Respiratory status: spontaneous breathing, nonlabored ventilation and respiratory function stable Cardiovascular status: blood pressure returned to baseline and stable Postop Assessment: no apparent nausea or vomiting Anesthetic complications: no   No notable events documented.  Last Vitals:  Vitals:   12/12/23 1745 12/12/23 1804  BP: 106/69 (!) 127/58  Pulse: 62 65  Resp: 13 18  Temp:  36.4 C  SpO2: 100% 98%    Last Pain:  Vitals:   12/12/23 2018  TempSrc:   PainSc: 6                  Adaora Mchaney

## 2023-12-12 NOTE — H&P (Signed)
 TOTAL KNEE ADMISSION H&P  Patient is being admitted for right total knee arthroplasty.  Subjective:  Chief Complaint: Right knee pain.  HPI: [name], 73 y.o. 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 The knee was initially fixed in 09/2010. She then had an incident two years ago, in November 2023, where she had to have an ORIF of a right femur fracture. She reports that the knee has been painful and hot since the fracture fixation, with significant swelling that has persisted since the surgery. The swelling has worsened over the last three to four months. She has rheumatoid arthritis (RA) and notes that her sedimentation rate (Sedrate) has never been as high as it is currently. She mentions that the knee has never been free of swelling since the surgery, which she was told was common, but it has not improved. She has been taking Motrin  a couple of times a day, which has provided some relief.  [name] also developed a stage IV buttocks ulcer during rehab from the ORIF, which required 56 weeks of treatment at a wound center. The ulcer was 12.5 centimeters deep and reached the muscle. She also developed an ulcer on her heel, which made it difficult to put pressure on the leg and hindered her ability to participate in physical therapy.  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 She has been to a pulmonologist for this condition. She expresses concern about the possibility of needing another surgery and the potential for infection in her knee.  Patient Active Problem List   Diagnosis Date Noted   IBS (irritable bowel syndrome) 11/02/2023   IDA (iron  deficiency anemia) 06/05/2023   Normocytic normochromic anemia 06/05/2023   Normocytic anemia 06/01/2023   Situational anxiety 10/15/2022   Pressure sore of left ischial area, stage IV (HCC) 09/10/2022   DNR (do not resuscitate)  06/21/2022   Agatston coronary artery calcium  score greater than 400 05/25/2022   Laryngopharyngeal reflux (LPR) 10/10/2021   Chronic maxillary sinusitis 10/10/2021   Prediabetes 02/14/2021   Chronic kidney disease, stage 3b (HCC) 08/01/2020   Post-menopausal 09/02/2019   Recurrent urticaria 05/20/2018   Vitamin D  deficiency 11/20/2017   Mixed hyperlipidemia 11/20/2017   Raynaud's disease without gangrene 03/12/2017   Eosinophilic esophagitis 10/12/2016   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   Autoimmune disease (HCC) 09/02/2016   Abnormality of gait 05/09/2016   Sjogren's syndrome (HCC) 02/29/2016   GERD (gastroesophageal reflux disease) 01/19/2016   Glucose intolerance (impaired glucose tolerance) 01/19/2016   Chronic diastolic CHF (congestive heart failure) (HCC) 04/20/2015   Peripheral neuropathy 02/02/2014   Class 2 obesity due to excess calories with body mass index (BMI) of 36.0 to 36.9 in adult 11/17/2013   Heart murmur    OSA (obstructive sleep apnea)    Rheumatoid arthritis (HCC)    Hiatal hernia    Fibromyalgia    PVC's (premature ventricular contractions)    Unilateral primary osteoarthritis, left knee 06/22/2011    Past Medical History:  Diagnosis Date   Agatston coronary artery calcium  score greater than 400 05/2022   coronary Ca score 856   Anemia    Back pain    Blood transfusion    1981   Cancer (HCC)    basal cell carcinoma, x 3   on leg and bottom of spine   Chronic diastolic CHF (congestive heart failure) (HCC)    diastolic  Chronic kidney disease    CKD3a   COVID    DDD (degenerative disc disease), cervical    DDD (degenerative disc disease), lumbar    Family history of coronary arteriosclerosis- strong fam h/o CAD and early CAD.  07/17/2017   Fibromyalgia    GERD (gastroesophageal reflux disease)    Heart murmur    as a child   Hiatal hernia    sjorgens syndrome   High blood pressure    High cholesterol    IBS (irritable bowel  syndrome)    OSA (obstructive sleep apnea)    Osteoarthritis    Peripheral autonomic neuropathy of unknown cause    Pneumonia    Pre-diabetes    PVC (premature ventricular contraction)    Raynaud disease    Rheumatoid arthritis (HCC)    Sjogren's disease (HCC)    Urticaria    Vitamin D  deficiency     Past Surgical History:  Procedure Laterality Date   ABDOMINAL HYSTERECTOMY     BTL, BSO   ADENOIDECTOMY     BREAST EXCISIONAL BIOPSY Left    BREAST SURGERY     mass removal    CHOLECYSTECTOMY     dental implants     DIAGNOSTIC LAPAROSCOPY     x3   FEMUR FRACTURE SURGERY Right    KNEE ARTHROSCOPY     x2   MASS EXCISION Left 03/22/2017   Procedure: EXCISION OF LEFT BREAST MASS;  Surgeon: Dareen Ebbing, MD;  Location: [facility];  Service: General;  Laterality: Left;   MULTIPLE TOOTH EXTRACTIONS  06/2023   x5   ORIF FEMUR FRACTURE Right 06/22/2022   Procedure: RIGHT OPEN REDUCTION INTERNAL FIXATION (ORIF) DISTAL FEMUR FRACTURE;  Surgeon: Hardy Lia, MD;  Location: [facility];  Service: Orthopedics;  Laterality: Right;   patotid cystectomy     RIGHT/LEFT HEART CATH AND CORONARY ANGIOGRAPHY N/A 07/25/2019   Procedure: RIGHT/LEFT HEART CATH AND CORONARY ANGIOGRAPHY;  Surgeon: Mardell Shade, MD;  Location: [facility];  Service: Cardiovascular;  Laterality: N/A;   TENDON REPAIR  1980   left ankle and tibia   TONSILLECTOMY     TOTAL KNEE ARTHROPLASTY  06/21/2011   Procedure: TOTAL KNEE ARTHROPLASTY;  Surgeon: Aurther Blue;  Location: [facility];  Service: Orthopedics;  Laterality: Right;   TUBAL LIGATION      Prior to Admission medications   Medication Sig Start Date End Date Taking? Authorizing Provider  aspirin  EC 81 MG tablet Take 81 mg by mouth at bedtime.   Yes [provider]  cetirizine  (ZYRTEC ) 10 MG tablet TAKE 1 TABLET (10 MG TOTAL) BY MOUTH DAILY. 10/12/23  Yes Maryclare Smoke A, PA  colestipol  (COLESTID ) 1 g tablet Take 2 g by mouth daily as needed  (lowering cholesterol).   Yes [provider]  cycloSPORINE  (RESTASIS ) 0.05 % ophthalmic emulsion Place 2 drops into both eyes 2 (two) times daily as needed (dry eyes).   Yes [provider]  diphenoxylate-atropine  (LOMOTIL) 2.5-0.025 MG per tablet Take 1 tablet by mouth 4 (four) times daily as needed for diarrhea or loose stools.   Yes [provider]  fenofibrate  (TRICOR ) 48 MG tablet Take 1 tablet (48 mg total) by mouth daily. 08/20/23  Yes Turner, Rufus Council, MD  folic acid  (FOLVITE ) 1 MG tablet Take 1 tablet (1 mg total) by mouth daily. 06/01/23  Yes Pasam, Avinash, MD  gabapentin  (NEURONTIN ) 300 MG capsule TAKE 2 CAPSULES BY MOUTH 2 TIMES DAILY. Patient taking differently: Take  300-600 mg by mouth See admin instructions. Take 300 mg by mouth in the morning and 600 mg at night 09/24/23  Yes Millikan, Megan, NP  hydroxychloroquine  (PLAQUENIL ) 200 MG tablet TAKE 1 TABLET (200 MG TOTAL) BY MOUTH DAILY. 10/12/23  Yes Romayne Clubs, PA-C  hyoscyamine  (ANASPAZ ) 0.125 MG TBDP disintergrating tablet Place 0.25 mg under the tongue every 6 (six) hours as needed for cramping.    Yes [provider]  ibuprofen  (ADVIL ) 800 MG tablet TAKE 1 TABLET BY MOUTH EVERY 6 HOURS AS NEEDED FOR MILD PAIN. Patient taking differently: Take 800 mg by mouth in the morning and at bedtime. 08/06/23  Yes Noreene Bearded, PA  LORazepam  (ATIVAN ) 0.5 MG tablet At bedtime prn 11/02/23  Yes Laneta Pintos, MD  methocarbamol  (ROBAXIN ) 500 MG tablet Take 1 tablet (500 mg total) by mouth every 6 (six) hours as needed for muscle spasms. 07/30/23  Yes Maryclare Smoke A, PA  metoprolol  succinate (TOPROL -XL) 25 MG 24 hr tablet TAKE 1/2 TABLET BY MOUTH (12.5 MG TOTAL) DAILY 10/12/23  Yes Turner, Rufus Council, MD  Multiple Vitamin (MULTIVITAMIN WITH MINERALS) TABS tablet Take 1 tablet by mouth daily.   Yes [provider]  NONFORMULARY OR COMPOUNDED ITEM Apply 1 application  topically daily as needed  (irritation). Triamcinolone  0.1% & Silvadene cream 1:1- Apply as directed to affected areas as needed   Yes [provider]  nystatin  (MYCOSTATIN /NYSTOP ) powder Apply 1 Application topically 3 (three) times daily. Patient taking differently: Apply 1 Application topically daily as needed (irritation). 12/28/22  Yes Boscia, Heather E, NP  nystatin  cream (MYCOSTATIN ) Apply 1 Application topically daily.   Yes [provider]  omeprazole (PRILOSEC) 40 MG capsule Take 40 mg by mouth daily as needed (acid reflux). 10/05/21  Yes [provider]  potassium chloride  (KLOR-CON ) 10 MEQ tablet Take 2 tablets (20 mEq total) by mouth daily. 05/04/21  Yes Jenean Minus, MD  promethazine  (PHENERGAN ) 25 MG tablet TAKE 1 TABLET BY MOUTH EVERY 6 HOURS AS NEEDED. 02/08/23  Yes Romayne Clubs, PA-C  rosuvastatin  (CRESTOR ) 10 MG tablet Take 1 tablet (10 mg total) by mouth daily. Please keep scheduled appointment for future refills. Thank you. 09/24/23  Yes Turner, Rufus Council, MD  spironolactone  (ALDACTONE ) 25 MG tablet TAKE 1 TABLET (25 MG TOTAL) BY MOUTH DAILY. Patient taking differently: Take 12.5 mg by mouth daily. 06/08/23  Yes Tammie Fall, MD  torsemide  40 MG TABS Take 40 mg by mouth daily. 01/12/21  Yes Bensimhon, Rheta Celestine, MD  amiodarone  (PACERONE ) 200 MG tablet TAKE 1 TABLET (200 MG TOTAL) BY MOUTH DAILY. Patient not taking: Reported on 12/07/2023 12/21/22   Tammie Fall, MD  doxylamine , Sleep, (UNISOM ) 25 MG tablet Take 1 tablet (25 mg total) by mouth at bedtime as needed. Patient not taking: Reported on 12/07/2023 09/09/20   Jenean Minus, MD  fluticasone  (FLONASE ) 50 MCG/ACT nasal spray Place 1 spray into both nostrils daily. Patient not taking: Reported on 12/07/2023 06/14/21   Jenean Minus, MD  tirzepatide (ZEPBOUND) 2.5 MG/0.5ML injection vial Inject 2.5 mg into the skin once a week. 11/02/23   Laneta Pintos, MD    Allergies  Allergen Reactions   Sulfa  Antibiotics Hives and Itching    Flushing   Cymbalta [Duloxetine Hcl] Other (See Comments)    Manic reaction   Demerol Hives and Other (See Comments)    Fever    Ivp Dye [Iodinated Contrast Media] Hives and  Other (See Comments)    Fever   Morphine  And Codeine Other (See Comments)    ineffective   Sulfasalazine Hives and Other (See Comments)    Flushing   Adhesive [Tape] Rash    Social History   Socioeconomic History   Marital status: Widowed    Spouse name: Not on file   Number of children: 2   Years of education: AS   Highest education level: Bachelor's degree (e.g., BA, AB, BS)  Occupational History   Occupation: retired Engineer, civil (consulting)   Occupation: RN  Tobacco Use   Smoking status: Never    Passive exposure: Past   Smokeless tobacco: Never  Vaping Use   Vaping status: Never Used  Substance and Sexual Activity   Alcohol  use: Yes    Comment: occ   Drug use: No   Sexual activity: Never  Other Topics Concern   Not on file  Social History Narrative   Lives at home alone   Right-handed   Drinks 1 or less cups of coffee and 2 servings of either tea or soda per day   Social Drivers of Health   Financial Resource Strain: Low Risk  (07/29/2023)   Overall Financial Resource Strain (CARDIA)    Difficulty of Paying Living Expenses: Not hard at all  Food Insecurity: No Food Insecurity (07/29/2023)   Hunger Vital Sign    Worried About Running Out of Food in the Last Year: Never true    Ran Out of Food in the Last Year: Never true  Transportation Needs: No Transportation Needs (07/29/2023)   PRAPARE - Administrator, Civil Service (Medical): No    Lack of Transportation (Non-Medical): No  Physical Activity: Inactive (07/29/2023)   Exercise Vital Sign    Days of Exercise per Week: 0 days    Minutes of Exercise per Session: 30 min  Stress: No Stress Concern Present (07/29/2023)   [employer] of Occupational Health - Occupational Stress Questionnaire    Feeling  of Stress : Only a little  Social Connections: Moderately Integrated (07/29/2023)   Social Connection and Isolation Panel [NHANES]    Frequency of Communication with Friends and Family: More than three times a week    Frequency of Social Gatherings with Friends and Family: Three times a week    Attends Religious Services: More than 4 times per year    Active Member of Clubs or Organizations: Yes    Attends Banker Meetings: Patient declined    Marital Status: Widowed  Intimate Partner Violence: Not At Risk (12/28/2022)   Humiliation, Afraid, Rape, and Kick questionnaire    Fear of Current or Ex-Partner: No    Emotionally Abused: No    Physically Abused: No    Sexually Abused: No    Tobacco Use: Low Risk  (12/12/2023)   Patient History    Smoking Tobacco Use: Never    Smokeless Tobacco Use: Never    Passive Exposure: Past   Social History   Substance and Sexual Activity  Alcohol  Use Yes   Comment: occ    Family History  Problem Relation Age of Onset   Alzheimer's disease Mother    Heart attack Mother    Hypertension Mother    Glaucoma Mother    High Cholesterol Mother    Heart disease Mother    Depression Mother    Cancer Father    High Cholesterol Father    Heart disease Father    Sleep apnea Father  Breast cancer Maternal Aunt    Heart attack Brother    Heart disease Brother    Glaucoma Brother    Hyperlipidemia Brother    Glaucoma Brother    Asthma Son    Allergic rhinitis Neg Hx    Angioedema Neg Hx    Eczema Neg Hx    Urticaria Neg Hx    Neuropathy Neg Hx     ROS  Objective:  Physical Exam: - Right knee shows very slight swelling. - No warmth or redness about the knee. - Range of motion: 0-115 degrees. - Anterior-posterior laxity and a little bit of varus-valgus laxity in the knee. - No gross instability.  Vital signs in last 24 hours: Temp:  [98.6 F (37 C)] 98.6 F (37 C) (04/30 1136) Pulse Rate:  [69] 69 (04/30 1136) Resp:   [18] 18 (04/30 1136) BP: (152)/(66) 152/66 (04/30 1136) SpO2:  [98 %] 98 % (04/30 1136) Weight:  [98.9 kg] 98.9 kg (04/30 1145)  Imaging Review Reviewed radiographs AP and lateral views of the knee. The prosthesis is in good position with no definitive periprosthetic abnormalities. There is a lateral plate from a periprosthetic fracture.  Assessment/Plan:  Infected total knee, right  She has an infection with Enterococcus. I told her that these infections are very difficult to eradicate and that surgery would be needed. Options would be a 2-stage revision vs a debridement and poly exchange. The complicating factor with a 2-stage is that she has a long lateral locking plate on her femur from a previous fracture that would also have to be removed. Her medical comorbidities make that procedure far less appealing. She decided to proceed with a debridement and poly exchange, knowing it had a slightly lower likelihood of eradicating the infection, but would be far less disabling and safer given her medical condition.  Anticipated LOS equal to or greater than 2 midnights due to - Age 65 and older with one or more of the following:  - Obesity  - Expected need for hospital services (PT, OT, Nursing) required for safe  discharge  - Anticipated need for postoperative skilled nursing care or inpatient rehab  - Active co-morbidities: Heart Failure, Respiratory Failure/COPD, and Anemia OR   - Unanticipated findings during/Post Surgery: Infected knee joint or operative site  - Patient is a high risk of re-admission due to: None    R. Brinton Canavan, PA-C Orthopedic Surgery [facility] [place]

## 2023-12-12 NOTE — Discharge Instructions (Signed)
Marisa Gross, MD Total Joint Specialist [facility] [address], [address] [place], [place] [address] [phone]  KNEE POSTOPERATIVE DIRECTIONS  Knee Rehabilitation, Guidelines Following Surgery  Results after knee surgery are often greatly improved when you follow the exercise, range of motion and muscle strengthening exercises prescribed by your doctor. Safety measures are also important to protect the knee from further injury. If any of these exercises cause you to have increased pain or swelling in your knee joint, decrease the amount until you are comfortable again and slowly increase them. If you have problems or questions, call your caregiver or physical therapist for advice.   HOME CARE INSTRUCTIONS  Remove items at home which could result in a fall. This includes throw rugs or furniture in walking pathways.  ICE to the affected knee as much as tolerated. Icing helps control swelling. If the swelling is well controlled you will be more comfortable and rehab easier. Continue to use ice on the knee for pain and swelling from surgery. You may notice swelling that will progress down to the foot and ankle. This is normal after surgery. Elevate the leg when you are not up walking on it.    Continue to use the breathing machine which will help keep your temperature down. It is common for your temperature to cycle up and down following surgery, especially at night when you are not up moving around and exerting yourself. The breathing machine keeps your lungs expanded and your temperature down. Do not place pillow under the operative knee, focus on keeping the knee straight while resting  DIET You may resume your previous home diet once you are discharged from the hospital.  DRESSING / WOUND CARE / SHOWERING Keep your bulky bandage on for 2 days. On the third post-operative day you may remove the Ace bandage and gauze. There is a waterproof adhesive bandage on your skin  which will stay in place until your first follow-up appointment. Once you remove this you will not need to place another bandage You may begin showering 3 days following surgery, but do not submerge the incision under water.  ACTIVITY For the first 5 days, the key is rest and control of pain and swelling Do your home exercises twice a day starting on post-operative day 3. On the days you go to physical therapy, just do the home exercises once that day. You should rest, ice and elevate the leg for 50 minutes out of every hour. Get up and walk/stretch for 10 minutes per hour. After 5 days you can increase your activity slowly as tolerated. Walk with your walker as instructed. Use the walker until you are comfortable transitioning to a cane. Walk with the cane in the opposite hand of the operative leg. You may discontinue the cane once you are comfortable and walking steadily. Avoid periods of inactivity such as sitting longer than an hour when not asleep. This helps prevent blood clots.  You may discontinue the knee immobilizer once you are able to perform a straight leg raise while lying down. You may resume a sexual relationship in one month or when given the OK by your doctor.  You may return to work once you are cleared by your doctor.  Do not drive a car for 6 weeks or until released by your surgeon.  Do not drive while taking narcotics.  TED HOSE STOCKINGS Wear the elastic stockings on both legs for three weeks following surgery during the day. You may remove  them at night for sleeping.  WEIGHT BEARING Weight bearing as tolerated with assist device (walker, cane, etc) as directed, use it as long as suggested by your surgeon or therapist, typically at least 4-6 weeks.  POSTOPERATIVE CONSTIPATION PROTOCOL Constipation - defined medically as fewer than three stools per week and severe constipation as less than one stool per week.  One of the most common issues patients have following surgery  is constipation.  Even if you have a regular bowel pattern at home, your normal regimen is likely to be disrupted due to multiple reasons following surgery.  Combination of anesthesia, postoperative narcotics, change in appetite and fluid intake all can affect your bowels.  In order to avoid complications following surgery, here are some recommendations in order to help you during your recovery period.  Colace (docusate) - Pick up an over-the-counter form of Colace or another stool softener and take twice a day as long as you are requiring postoperative pain medications.  Take with a full glass of water daily.  If you experience loose stools or diarrhea, hold the colace until you stool forms back up. If your symptoms do not get better within 1 week or if they get worse, check with your doctor. Dulcolax (bisacodyl) - Pick up over-the-counter and take as directed by the product packaging as needed to assist with the movement of your bowels.  Take with a full glass of water.  Use this product as needed if not relieved by Colace only.  MiraLax (polyethylene glycol) - Pick up over-the-counter to have on hand. MiraLax is a solution that will increase the amount of water in your bowels to assist with bowel movements.  Take as directed and can mix with a glass of water, juice, soda, coffee, or tea. Take if you go more than two days without a movement. Do not use MiraLax more than once per day. Call your doctor if you are still constipated or irregular after using this medication for 7 days in a row.  If you continue to have problems with postoperative constipation, please contact the office for further assistance and recommendations.  If you experience "the worst abdominal pain ever" or develop nausea or vomiting, please contact the office immediatly for further recommendations for treatment.  ITCHING If you experience itching with your medications, try taking only a single pain pill, or even half a pain pill at a  time.  You can also use Benadryl over the counter for itching or also to help with sleep.   MEDICATIONS See your medication summary on the "After Visit Summary" that the nursing staff will review with you prior to discharge.  You may have some home medications which will be placed on hold until you complete the course of blood thinner medication.  It is important for you to complete the blood thinner medication as prescribed by your surgeon.  Continue your approved medications as instructed at time of discharge.  PRECAUTIONS If you experience chest pain or shortness of breath - call 911 immediately for transfer to the hospital emergency department.  If you develop a fever greater that 101 F, purulent drainage from wound, increased redness or drainage from wound, foul odor from the wound/dressing, or calf pain - CONTACT YOUR SURGEON.  FOLLOW-UP APPOINTMENTS Make sure you keep all of your appointments after your operation with your surgeon and caregivers. You should call the office at the above phone number and make an appointment for approximately two weeks after the date of your surgery or on the date instructed by your surgeon outlined in the "After Visit Summary".  RANGE OF MOTION AND STRENGTHENING EXERCISES  Rehabilitation of the knee is important following a knee injury or an operation. After just a few days of immobilization, the muscles of the thigh which control the knee become weakened and shrink (atrophy). Knee exercises are designed to build up the tone and strength of the thigh muscles and to improve knee motion. Often times heat used for twenty to thirty minutes before working out will loosen up your tissues and help with improving the range of motion but do not use heat for the first two weeks following surgery. These exercises can be done on a training (exercise) mat, on the floor, on a table or on a bed. Use what ever works the best and is  most comfortable for you Knee exercises include:  Leg Lifts - While your knee is still immobilized in a splint or cast, you can do straight leg raises. Lift the leg to 60 degrees, hold for 3 sec, and slowly lower the leg. Repeat 10-20 times 2-3 times daily. Perform this exercise against resistance later as your knee gets better.  Quad and Hamstring Sets - Tighten up the muscle on the front of the thigh (Quad) and hold for 5-10 sec. Repeat this 10-20 times hourly. Hamstring sets are done by pushing the foot backward against an object and holding for 5-10 sec. Repeat as with quad sets.  Leg Slides: Lying on your back, slowly slide your foot toward your buttocks, bending your knee up off the floor (only go as far as is comfortable). Then slowly slide your foot back down until your leg is flat on the floor again. Angel Wings: Lying on your back spread your legs to the side as far apart as you can without causing discomfort.  A rehabilitation program following serious knee injuries can speed recovery and prevent re-injury in the future due to weakened muscles. Contact your doctor or a physical therapist for more information on knee rehabilitation.   POST-OPERATIVE OPIOID TAPER INSTRUCTIONS: It is important to wean off of your opioid medication as soon as possible. If you do not need pain medication after your surgery it is ok to stop day one. Opioids include: Codeine, Hydrocodone(Norco, Vicodin), Oxycodone(Percocet, oxycontin) and hydromorphone amongst others.  Long term and even short term use of opiods can cause: Increased pain response Dependence Constipation Depression Respiratory depression And more.  Withdrawal symptoms can include Flu like symptoms Nausea, vomiting And more Techniques to manage these symptoms Hydrate well Eat regular healthy meals Stay active Use relaxation techniques(deep breathing, meditating, yoga) Do Not substitute Alcohol to help with tapering If you have been on  opioids for less than two weeks and do not have pain than it is ok to stop all together.  Plan to wean off of opioids This plan should start within one week post op of your joint replacement. Maintain the same interval or time between taking each dose and first decrease the dose.  Cut the total daily intake of opioids by one tablet each day Next start to increase the time between doses. The last dose that should be eliminated is the evening dose.   IF YOU ARE TRANSFERRED TO  A SKILLED REHAB FACILITY If the patient is transferred to a skilled rehab facility following release from the hospital, a list of the current medications will be sent to the facility for the patient to continue.  When discharged from the skilled rehab facility, please have the facility set up the patient's Home Health Physical Therapy prior to being released. Also, the skilled facility will be responsible for providing the patient with their medications at time of release from the facility to include their pain medication, the muscle relaxants, and their blood thinner medication. If the patient is still at the rehab facility at time of the two week follow up appointment, the skilled rehab facility will also need to assist the patient in arranging follow up appointment in our office and any transportation needs.  MAKE SURE YOU:  Understand these instructions.  Get help right away if you are not doing well or get worse.   DENTAL ANTIBIOTICS:  In most cases prophylactic antibiotics for Dental procdeures after total joint surgery are not necessary.  Exceptions are as follows:  1. History of prior total joint infection  2. Severely immunocompromised (Organ Transplant, cancer chemotherapy, Rheumatoid biologic medications such as Humera)  3. Poorly controlled diabetes (A1C &gt; 8.0, blood glucose over 200)  If you have one of these conditions, contact your surgeon for an antibiotic prescription, prior to your dental procedure.     Pick up stool softner and laxative for home use following surgery while on pain medications. Do not submerge incision under water. Please use good hand washing techniques while changing dressing each day. May shower starting three days after surgery. Please use a clean towel to pat the incision dry following showers. Continue to use ice for pain and swelling after surgery. Do not use any lotions or creams on the incision until instructed by your surgeon.

## 2023-12-12 NOTE — Progress Notes (Signed)
   12/12/23 2257  BiPAP/CPAP/SIPAP  BiPAP/CPAP/SIPAP Pt Type Adult  BiPAP/CPAP/SIPAP Resmed  Mask Type Full face mask  Dentures removed? Yes - Given to RN (Per pt have been out all day)  Mask Size Medium  Flow Rate 2 lpm  Patient Home Machine No  Patient Home Mask Yes  Patient Home Tubing No  Auto Titrate Yes  Minimum cmH2O 5 cmH2O  Maximum cmH2O 20 cmH2O  Device Plugged into RED Power Outlet Yes

## 2023-12-12 NOTE — Progress Notes (Signed)
 Orthopedic Tech Progress Note Patient Details:  [name] 1951-07-15 [id number]  CPM Right Knee CPM Right Knee: On Right Knee Flexion (Degrees): 40 Right Knee Extension (Degrees): 10  Post Interventions Patient Tolerated: Well Instructions Provided: Adjustment of device, Care of device  Leodis Rainwater 12/12/2023, 4:30 PM

## 2023-12-12 NOTE — Progress Notes (Signed)
 [name]

## 2023-12-12 NOTE — Brief Op Note (Signed)
 12/12/2023  3:45 PM  PATIENT:  [name]  73 y.o. female  PRE-OPERATIVE DIAGNOSIS:  Infected Right Total Knee arthroplasty  POST-OPERATIVE DIAGNOSIS:  Infected Right Total Knee arthroplasty  PROCEDURE:  Procedure(s): IRRIGATION AND DEBRIDEMENT KNEE WITH POLY EXCHANGE (Right)  SURGEON:  Surgeons and Role:    Liliane Rei, MD - Primary  PHYSICIAN ASSISTANT:   ASSISTANTS: Sharlynn Dear, PA-C   ANESTHESIA:   general  EBL:  25 ml  BLOOD ADMINISTERED:none  DRAINS: none   LOCAL MEDICATIONS USED:  NONE  COUNTS:  YES  TOURNIQUET:  * Missing tourniquet times found for documented tourniquets in log: [id number] *  DICTATION: .dictation # [id number]  PLAN OF CARE: Admit to inpatient   PATIENT DISPOSITION:  PACU - hemodynamically stable.

## 2023-12-12 NOTE — Progress Notes (Signed)
 Orthopedic Tech Progress Note Patient Details:  [name] June 13, 1951 [id number]  CPM Right Knee CPM Right Knee: Off Right Knee Flexion (Degrees): 40 Right Knee Extension (Degrees): 10  Post Interventions Patient Tolerated: Well Instructions Provided: Adjustment of device, Care of device  Leodis Rainwater 12/12/2023, 8:00 PM

## 2023-12-13 ENCOUNTER — Other Ambulatory Visit: Payer: Self-pay

## 2023-12-13 ENCOUNTER — Encounter (HOSPITAL_COMMUNITY): Payer: Self-pay | Admitting: Orthopedic Surgery

## 2023-12-13 DIAGNOSIS — T8453XA Infection and inflammatory reaction due to internal right knee prosthesis, initial encounter: Secondary | ICD-10-CM

## 2023-12-13 DIAGNOSIS — B952 Enterococcus as the cause of diseases classified elsewhere: Secondary | ICD-10-CM | POA: Diagnosis not present

## 2023-12-13 LAB — CBC
HCT: 35.9 % — ABNORMAL LOW (ref 36.0–46.0)
Hemoglobin: 11.3 g/dL — ABNORMAL LOW (ref 12.0–15.0)
MCH: 27.6 pg (ref 26.0–34.0)
MCHC: 31.5 g/dL (ref 30.0–36.0)
MCV: 87.8 fL (ref 80.0–100.0)
Platelets: 249 10*3/uL (ref 150–400)
RBC: 4.09 MIL/uL (ref 3.87–5.11)
RDW: 14.1 % (ref 11.5–15.5)
WBC: 8.7 10*3/uL (ref 4.0–10.5)
nRBC: 0 % (ref 0.0–0.2)

## 2023-12-13 LAB — BASIC METABOLIC PANEL WITH GFR
Anion gap: 12 (ref 5–15)
BUN: 22 mg/dL (ref 8–23)
CO2: 20 mmol/L — ABNORMAL LOW (ref 22–32)
Calcium: 8.8 mg/dL — ABNORMAL LOW (ref 8.9–10.3)
Chloride: 99 mmol/L (ref 98–111)
Creatinine, Ser: 1.14 mg/dL — ABNORMAL HIGH (ref 0.44–1.00)
GFR, Estimated: 51 mL/min — ABNORMAL LOW (ref >60)
Glucose, Bld: 150 mg/dL — ABNORMAL HIGH (ref 70–99)
Potassium: 4.1 mmol/L (ref 3.5–5.1)
Sodium: 131 mmol/L — ABNORMAL LOW (ref 135–145)

## 2023-12-13 MED ORDER — SODIUM CHLORIDE 0.9% FLUSH: 10.0000 mL | INTRAVENOUS | Status: DC | PRN

## 2023-12-13 MED ORDER — SODIUM CHLORIDE 0.9 % IV SOLN
2.0000 g | INTRAVENOUS | Status: DC
  Administered 2023-12-13 – 2023-12-15 (×12): 2 g via INTRAVENOUS
  Filled 2023-12-13 (×14): qty 2000

## 2023-12-13 MED ORDER — CHLORHEXIDINE GLUCONATE CLOTH 2 % EX PADS
6.0000 | MEDICATED_PAD | Freq: Every day | CUTANEOUS | Status: DC
  Administered 2023-12-13 – 2023-12-15 (×3): 6 via TOPICAL

## 2023-12-13 MED ORDER — AMPICILLIN IV (FOR PTA / DISCHARGE USE ONLY)
12.0000 g | INTRAVENOUS | 0 refills | Status: DC
Start: 2023-12-13 — End: 2023-12-28

## 2023-12-13 NOTE — TOC Transition Note (Signed)
 Transition of Care [facility]) - Discharge Note   Patient Details  Name: [name] MRN: [id number] Date of Birth: February 22, 1951  [facility]) CM/SW Contact:  Delilah Fend, LCSW Phone Number: 12/13/2023, 4:04 PM   Clinical Narrative:     Met with pt and son today to review anticipated dc needs.  Pt confirms plan to dc home and son able to provide intermittent support.  Pt aware and agreeable with plan for home IV abx x 6weeks.  Discussed need to make HHRN/ PTreferral and IV abx referral.  Pt requesting [facility] and no preference for IV infusion.  Unfortunately, [facility] unable to accept due to staffing and pt has accepted [facility] for the [facility] services.  Referrals placed with [facility] for abx coverage.  Pt has needed DME in the home.  No further TOC needs.  Please   Final next level of care: Home w Home Health Services Barriers to Discharge: No Barriers Identified   Patient Goals and CMS Choice Patient states their goals for this hospitalization and ongoing recovery are:: return home          Discharge Placement                       Discharge Plan and Services Additional resources added to the After Visit Summary for                  DME Arranged: N/A DME Agency: NA       HH Arranged: RN, IV Antibiotics, PT HH Agency: [facility] Date [facility] Agency Contacted: 12/13/23 Time HH Agency Contacted: [phone] Representative spoke with at [facility] Agency: [facility] - Kay Parson; [facility] - Marcina Severe  Social Drivers of Health (SDOH) Interventions SDOH Screenings   Food Insecurity: No Food Insecurity (12/12/2023)  Housing: Low Risk  (12/12/2023)  Transportation Needs: No Transportation Needs (12/12/2023)  Utilities: Not At Risk (12/12/2023)  Alcohol  Screen: Low Risk  (07/29/2023)  Depression (PHQ2-9): Low Risk  (11/02/2023)  Financial Resource Strain: Low Risk  (07/29/2023)  Physical Activity: Inactive (07/29/2023)  Social Connections: Moderately  Integrated (12/12/2023)  Stress: No Stress Concern Present (07/29/2023)  Tobacco Use: Low Risk  (12/12/2023)     Readmission Risk Interventions    12/13/2023    2:57 PM  Readmission Risk Prevention Plan  Transportation Screening Complete  PCP or Specialist Appt within 5-7 Days Complete  Home Care Screening Complete  Medication Review (RN CM) Complete

## 2023-12-13 NOTE — Progress Notes (Signed)
 Physical Therapy Treatment Patient Details Name: [name] MRN: [id number] DOB: 03-Sep-1950 Today's Date: 12/13/2023   History of Present Illness [name], 73 y.o. 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 The knee was initially fixed in 09/2010. She then had an incident two years ago, in November 2023, where she had to have an ORIF of a right femur fracture. She reports that the knee has been painful and hot since the fracture fixation, with significant swelling that has persisted since the surgery. The swelling has worsened over the last three to four months.    PT Comments  Pt states that she is about to have central line placed. Able to review HEP which inclides ankle pumps, quad set, SLR, hip abd, knee flexion, knee extension 2 sets of 10 up to 2x/day to assist in recovery. Pt verbalizes understanding and is familiar with these interventions. Handout provided.     If plan is discharge home, recommend the following: A little help with walking and/or transfers;A little help with bathing/dressing/bathroom;Assistance with cooking/housework;Assist for transportation;Help with stairs or ramp for entrance   Can travel by private vehicle        Equipment Recommendations  Rolling walker (2 wheels)    Recommendations for Other Services       Precautions / Restrictions Precautions Precautions: Fall Recall of Precautions/Restrictions: Intact Restrictions Weight Bearing Restrictions Per Provider Order: Yes RLE Weight Bearing Per Provider Order: Weight bearing as tolerated     Mobility  Bed Mobility Overal bed mobility: Needs Assistance Bed Mobility: Supine to Sit     Supine to sit: Supervision     General bed mobility comments: HOB elevated, has hospital bed at home    Transfers Overall transfer level: Needs assistance Equipment used: Rolling walker (2 wheels) Transfers: Sit to/from Stand, Bed to chair/wheelchair/BSC Sit to Stand: Contact guard  assist   Step pivot transfers: Contact guard assist            Ambulation/Gait Ambulation/Gait assistance: Contact guard assist Gait Distance (Feet): 12 Feet Assistive device: Rolling walker (2 wheels) Gait Pattern/deviations: Step-to pattern, Wide base of support, Antalgic Gait velocity: dec     General Gait Details: pt able to take small non reciprocal steps in the room with 2WW at [facility], has inc pain with directional changes >90 degrees to prepare for sitting   Stairs             Wheelchair Mobility     Tilt Bed    Modified Rankin (Stroke Patients Only)       Balance Overall balance assessment: Needs assistance Sitting-balance support: Feet supported, No upper extremity supported Sitting balance-Leahy Scale: Good     Standing balance support: Reliant on assistive device for balance, No upper extremity supported Standing balance-Leahy Scale: Fair                              Hotel manager: No apparent difficulties  Cognition Arousal: Alert Behavior During Therapy: WFL for tasks assessed/performed   PT - Cognitive impairments: No apparent impairments                         Following commands: Intact      Cueing Cueing Techniques: Verbal cues, Visual cues  Exercises Total Joint Exercises Ankle Circles/Pumps: AROM, Both, Other reps (comment) (x30) Quad Sets: AROM, 10 reps, Right    General Comments  Pertinent Vitals/Pain Pain Assessment Pain Assessment: 0-10 Pain Score: 4  Pain Location: R knee Pain Descriptors / Indicators: Aching, Constant, Discomfort, Operative site guarding, Sharp Pain Intervention(s): Monitored during session    Home Living                          Prior Function            PT Goals (current goals can now be found in the care plan section) Acute Rehab PT Goals Patient Stated Goal: dec pain, improve mobility PT Goal Formulation: With  patient Time For Goal Achievement: 12/27/23 Potential to Achieve Goals: Good Progress towards PT goals: Progressing toward goals    Frequency    Min 5X/week      PT Plan      Co-evaluation              AM-PAC PT "6 Clicks" Mobility   Outcome Measure  Help needed turning from your back to your side while in a flat bed without using bedrails?: A Little Help needed moving from lying on your back to sitting on the side of a flat bed without using bedrails?: A Little Help needed moving to and from a bed to a chair (including a wheelchair)?: A Little Help needed standing up from a chair using your arms (e.g., wheelchair or bedside chair)?: A Little Help needed to walk in hospital room?: A Little Help needed climbing 3-5 steps with a railing? : A Lot 6 Click Score: 17    End of Session   Activity Tolerance: Patient tolerated treatment well Patient left: in bed;with family/visitor present Nurse Communication: Mobility status PT Visit Diagnosis: Difficulty in walking, not elsewhere classified (R26.2);Pain Pain - Right/Left: Right Pain - part of body: Knee     Time: 9147-8295 PT Time Calculation (min) (ACUTE ONLY): 17 min  Charges:    $Therapeutic Exercise: 8-22 mins PT General Charges $$ ACUTE PT VISIT: 1 Visit                     Darien Eden, PT Acute Rehabilitation Services Office: [phone] 12/13/2023    Serafin Dames 12/13/2023, 4:58 PM

## 2023-12-13 NOTE — Evaluation (Signed)
 Physical Therapy Evaluation Patient Details Name: [name] MRN: [id number] DOB: Oct 31, 1950 Today's Date: 12/13/2023  History of Present Illness  TRANAE EICKMEYER, 73 y.o. 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 The knee was initially fixed in 09/2010. She then had an incident two years ago, in November 2023, where she had to have an ORIF of a right femur fracture. She reports that the knee has been painful and hot since the fracture fixation, with significant swelling that has persisted since the surgery. The swelling has worsened over the last three to four months.  Clinical Impression  Pt admitted with above diagnosis. Pt able to come to sit EOB at sup A with HOB elevated, has hospital bed at home. STS at [facility] and continues this level of assist throughout session. Pt trtansfers to [facility] and is able to don/doff briefs in standing. pt able to take small non reciprocal steps in the room with 2WW at [facility], has inc pain with directional changes >90 degrees to prepare for sitting  Pt currently with functional limitations due to the deficits listed below (see PT Problem List). Pt will benefit from acute skilled PT to increase their independence and safety with mobility to allow discharge.           If plan is discharge home, recommend the following: A little help with walking and/or transfers;A little help with bathing/dressing/bathroom;Assistance with cooking/housework;Assist for transportation;Help with stairs or ramp for entrance   Can travel by private vehicle        Equipment Recommendations Rolling walker (2 wheels)  Recommendations for Other Services       Functional Status Assessment Patient has had a recent decline in their functional status and demonstrates the ability to make significant improvements in function in a reasonable and predictable amount of time.     Precautions / Restrictions Precautions Precautions: Fall Recall of Precautions/Restrictions:  Intact Restrictions Weight Bearing Restrictions Per Provider Order: Yes RLE Weight Bearing Per Provider Order: Weight bearing as tolerated      Mobility  Bed Mobility Overal bed mobility: Needs Assistance Bed Mobility: Supine to Sit     Supine to sit: Supervision     General bed mobility comments: HOB elevated, has hospital bed at home    Transfers Overall transfer level: Needs assistance Equipment used: Rolling walker (2 wheels) Transfers: Sit to/from Stand, Bed to chair/wheelchair/BSC Sit to Stand: Contact guard assist   Step pivot transfers: Contact guard assist            Ambulation/Gait Ambulation/Gait assistance: Contact guard assist Gait Distance (Feet): 12 Feet Assistive device: Rolling walker (2 wheels) Gait Pattern/deviations: Step-to pattern, Wide base of support, Antalgic Gait velocity: dec     General Gait Details: pt able to take small non reciprocal steps in the room with 2WW at [facility], has inc pain with directional changes >90 degrees to prepare for sitting  Stairs            Wheelchair Mobility     Tilt Bed    Modified Rankin (Stroke Patients Only)       Balance Overall balance assessment: Needs assistance Sitting-balance support: Feet supported, No upper extremity supported Sitting balance-Leahy Scale: Good     Standing balance support: Reliant on assistive device for balance, No upper extremity supported Standing balance-Leahy Scale: Fair  Pertinent Vitals/Pain Pain Assessment Pain Assessment: 0-10 Pain Score: 4  Pain Location: R knee Pain Descriptors / Indicators: Aching, Constant, Discomfort, Operative site guarding, Sharp Pain Intervention(s): Limited activity within patient's tolerance, Monitored during session, Repositioned    Home Living Family/patient expects to be discharged to:: Private residence Living Arrangements: Alone Available Help at Discharge: Available 24  hours/day;Family (son nearby) Type of Home: House Home Access: Ramped entrance       Home Layout: One level Home Equipment: Rollator (4 wheels);Cane - single point;Shower seat;Hospital bed      Prior Function Prior Level of Function : Needs assist               ADLs Comments: assist with bathing     Extremity/Trunk Assessment             Cervical / Trunk Assessment Cervical / Trunk Assessment: Kyphotic  Communication   Communication Communication: No apparent difficulties    Cognition Arousal: Alert Behavior During Therapy: WFL for tasks assessed/performed   PT - Cognitive impairments: No apparent impairments                         Following commands: Intact       Cueing Cueing Techniques: Verbal cues, Visual cues     General Comments General comments (skin integrity, edema, etc.): R sacral wound, pt just finished wound clinic tx in Feb. Some discahrge noted this session, nursing assists with dressing and covering wound    Exercises     Assessment/Plan    PT Assessment Patient needs continued PT services  PT Problem List Decreased strength;Decreased activity tolerance;Decreased mobility;Decreased range of motion;Decreased balance       PT Treatment Interventions DME instruction;Functional mobility training;Balance training;Patient/family education;Gait training;Therapeutic activities;Therapeutic exercise    PT Goals (Current goals can be found in the Care Plan section)  Acute Rehab PT Goals Patient Stated Goal: dec pain, improve mobility PT Goal Formulation: With patient Time For Goal Achievement: 12/27/23 Potential to Achieve Goals: Good    Frequency Min 5X/week     Co-evaluation               AM-PAC PT "6 Clicks" Mobility  Outcome Measure Help needed turning from your back to your side while in a flat bed without using bedrails?: A Little Help needed moving from lying on your back to sitting on the side of a flat bed  without using bedrails?: A Little Help needed moving to and from a bed to a chair (including a wheelchair)?: A Little Help needed standing up from a chair using your arms (e.g., wheelchair or bedside chair)?: A Little Help needed to walk in hospital room?: A Little Help needed climbing 3-5 steps with a railing? : A Lot 6 Click Score: 17    End of Session Equipment Utilized During Treatment: Gait belt Activity Tolerance: Patient limited by pain Patient left: in chair;with call bell/phone within reach;with chair alarm set;with family/visitor present;with nursing/sitter in room Nurse Communication: Mobility status PT Visit Diagnosis: Difficulty in walking, not elsewhere classified (R26.2);Pain Pain - Right/Left: Right Pain - part of body: Knee    Time: 4540-9811 PT Time Calculation (min) (ACUTE ONLY): 24 min   Charges:   PT Evaluation $PT Eval Low Complexity: 1 Low PT Treatments $Therapeutic Activity: 8-22 mins PT General Charges $$ ACUTE PT VISIT: 1 Visit         Darien Eden, PT Acute Rehabilitation Services Office: [phone] 12/13/2023   Darien Eden  A Janalyn Higby 12/13/2023, 12:30 PM

## 2023-12-13 NOTE — Plan of Care (Signed)
  Problem: Education: Goal: Knowledge of General Education information will improve Description: Including pain rating scale, medication(s)/side effects and non-pharmacologic comfort measures Outcome: Progressing   Problem: Health Behavior/Discharge Planning: Goal: Ability to manage health-related needs will improve Outcome: Progressing   Problem: Clinical Measurements: Goal: Ability to maintain clinical measurements within normal limits will improve Outcome: Progressing Goal: Will remain free from infection Outcome: Progressing Goal: Diagnostic test results will improve Outcome: Progressing Goal: Respiratory complications will improve Outcome: Progressing Goal: Cardiovascular complication will be avoided Outcome: Progressing   Problem: Activity: Goal: Risk for activity intolerance will decrease Outcome: Progressing   Problem: Nutrition: Goal: Adequate nutrition will be maintained Outcome: Completed/Met   Problem: Coping: Goal: Level of anxiety will decrease Outcome: Progressing   Problem: Elimination: Goal: Will not experience complications related to bowel motility Outcome: Progressing Goal: Will not experience complications related to urinary retention Outcome: Completed/Met   Problem: Pain Managment: Goal: General experience of comfort will improve and/or be controlled Outcome: Progressing   Problem: Safety: Goal: Ability to remain free from injury will improve Outcome: Progressing   Problem: Skin Integrity: Goal: Risk for impaired skin integrity will decrease Outcome: Progressing   Problem: Education: Goal: Knowledge of the prescribed therapeutic regimen will improve Outcome: Progressing Goal: Individualized Educational Video(s) Outcome: Completed/Met   Problem: Activity: Goal: Ability to avoid complications of mobility impairment will improve Outcome: Progressing Goal: Range of joint motion will improve Outcome: Adequate for Discharge   Problem:  Clinical Measurements: Goal: Postoperative complications will be avoided or minimized Outcome: Progressing   Problem: Pain Management: Goal: Pain level will decrease with appropriate interventions Outcome: Progressing   Problem: Skin Integrity: Goal: Will show signs of wound healing Outcome: Progressing

## 2023-12-13 NOTE — Progress Notes (Addendum)
 Pt refusing CPAP at this time. CPAP in room on standby.  Pt is currently on Room Air and is tolerating well. No distress noted. Will continue to monitor pt.

## 2023-12-13 NOTE — Op Note (Signed)
 NAME: [name], [name]. MEDICAL RECORD NO: [id number] ACCOUNT NO: [id number] DATE OF BIRTH: 31-Oct-1950 FACILITY: [facility]: [id number] PHYSICIAN: Henri Loft. Suprina Mandeville, MD  Operative Report   DATE OF PROCEDURE: 12/12/2023   PREOPERATIVE DIAGNOSIS: Infected right total knee arthroplasty.  POSTOPERATIVE DIAGNOSIS: Infected right total knee arthroplasty.  PROCEDURE PERFORMED: Irrigation and debridement, polyethylene exchange, right knee.  SURGEON: Henri Loft. Rupert Azzara, MD  ASSISTANT: Thayne Fine, PA-C  ANESTHESIA: General.  ESTIMATED BLOOD LOSS: 25 mL.  DRAINS: None.  TOURNIQUET TIME: 37 minutes at 300 mmHg.  COMPLICATIONS: None.  CONDITION: Stable to recovery.  BRIEF CLINICAL NOTE: The patient is a 73 year old female who had a right total knee arthroplasty performed in 2012. She had done fine with the knee and then had a periprosthetic fracture about a year and a half ago, which was fixed by Dr. Guyann Leitz and  healed uneventfully. Unfortunately, she had a large buttock/sacral decubitus and took over a year to heal. She recently started to develop increasing pain and swelling in her right knee. I saw her in the office last week, aspirated the knee, and there  was infection. She presents now for the above-mentioned procedure. We discussed options including what we performed today versus a two-stage revision, taking out all the hardware, but given her comorbid conditions and given that she has that long plate  on the femur, we decided to retain the hardware and attempt to salvage the knee with an irrigation, debridement, and poly exchange and postop antibiotics.  DESCRIPTION OF PROCEDURE: After successful administration of general anesthetic, a tourniquet was placed on her right thigh and right lower extremity prepped and draped in the usual sterile fashion. Extremity was wrapped in Esmarch. The tourniquet was  inflated to 300 mmHg. Midline incision was made with a 10 blade through the  subcutaneous tissue to the extensor mechanism. A fresh blade was used to make a medial parapatellar arthrotomy. There was a lot of purulent-appearing fluid in the joint and that  is sent for Gram stain, cultures and sensitivity for aerobic and anaerobic cultures. Soft tissue over the proximal medial tibia was then subperiosteally elevated to the joint line with a knife and a 2 cm was dispersed with a Cobb elevator. Soft tissue  laterally was elevated with attention being paid to avoid any patellar tendon over the tibial tubercle. I then did a thorough synovectomy removing all of the inflamed and infected appearing tissue back to normal-appearing tissue. We started on the medial  side restoring the medial gutter and then went superiorly, restoring the superior space and then going laterally. We then removed the tibial polyethylene from the tibial tray. It was a size 3, 10 mm thick Sigma RP poly. Posteriorly, there was a small  amount of inflamed tissue, which was also removed. When I was satisfied with the synovectomy and removal of scar tissue, I thoroughly irrigated the joint with 3 liters of saline with pulsatile lavage and then 750 mL of Prontosan. We then changed our  gloves and changed the suction device. We then placed a permanent new 10-mm posterior stabilized rotating platform insert for the Sigma RP. The knee was reduced and then further irrigated with another 3 liters of saline with pulsatile lavage. I then  placed the final 50 mL of Prontosan into the joint and left it in there. We closed the arthrotomy with a running 0 Stratafix suture. Flexion against gravity about 130 degrees. Patella tracks normally. Tourniquet was released. Total time of 37 minutes.  Subcutaneous was closed  with interrupted 2-0 Vicryl and skin with staples. Incision was cleaned and dried and a bulky sterile dressing applied. She was awakened and transported to recovery in stable condition.  Note that the surgical assistance  of medical necessity for this procedure to do in a safe and expeditious manner. Surgical assistance necessary for retraction of vital ligaments and neurovascular structures for safe removal of the old implant and safe  and accurate placement of the new implant.     SUJ D: 12/12/2023 3:51:15 pm T: 12/13/2023 2:21:00 am  JOB: [id number]/ [id number]

## 2023-12-13 NOTE — Progress Notes (Signed)
 Subjective: 1 Day Post-Op Procedure(s) (LRB): IRRIGATION AND DEBRIDEMENT KNEE WITH POLY EXCHANGE (Right) Patient seen in rounds by Dr. France Ina. Patient is well, and has had no acute complaints or problems. Denies SOB or chest pain. Denies calf pain. Patient reports pain as moderate.  Objective: Vital signs in last 24 hours: Temp:  [97.4 F (36.3 C)-98.6 F (37 C)] 97.8 F (36.6 C) (05/01 0532) Pulse Rate:  [62-73] 70 (05/01 0532) Resp:  [11-18] 18 (05/01 0532) BP: (106-152)/(58-81) 138/59 (05/01 0532) SpO2:  [92 %-100 %] 100 % (05/01 0532) Weight:  [98.9 kg] 98.9 kg (04/30 1145)  Intake/Output from previous day:  Intake/Output Summary (Last 24 hours) at 12/13/2023 0835 Last data filed at 12/13/2023 0600 Gross per 24 hour  Intake 1930.83 ml  Output 650 ml  Net 1280.83 ml     Intake/Output this shift: No intake/output data recorded.  Labs: Recent Labs    12/10/23 1122 12/13/23 0329  HGB 11.8* 11.3*   Recent Labs    12/10/23 1122 12/13/23 0329  WBC 6.7 8.7  RBC 4.26 4.09  HCT 38.1 35.9*  PLT 243 249   Recent Labs    12/10/23 1122 12/13/23 0329  NA 137 131*  K 4.3 4.1  CL 104 99  CO2 24 20*  BUN 21 22  CREATININE 1.00 1.14*  GLUCOSE 112* 150*  CALCIUM  9.0 8.8*   No results for input(s): "LABPT", "INR" in the last 72 hours.  Exam: General - Patient is Alert and Oriented Extremity - Neurologically intact Neurovascular intact Sensation intact distally Dorsiflexion/Plantar flexion intact Dressing - dressing C/D/I Motor Function - intact, moving foot and toes well on exam.  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  07/17/2017   Fibromyalgia    GERD (gastroesophageal reflux disease)    Heart murmur    as a child   Hiatal hernia    sjorgens syndrome   High blood pressure    High cholesterol    IBS (irritable bowel syndrome)    OSA (obstructive sleep apnea)    Osteoarthritis    Peripheral autonomic neuropathy of unknown cause    Pneumonia    Pre-diabetes    PVC (premature ventricular contraction)    Raynaud disease    Rheumatoid arthritis (HCC)    Sjogren's disease (HCC)    Urticaria    Vitamin D  deficiency     Assessment/Plan: 1 Day Post-Op Procedure(s) (LRB): IRRIGATION AND DEBRIDEMENT KNEE WITH POLY EXCHANGE (Right) Principal Problem:   Infection of prosthetic right knee joint (HCC) Active Problems:   Infection of total right knee replacement (HCC)  Estimated body mass index is 41.19 kg/m as calculated from the following:   Height as of this encounter: 5\' 1"  (1.549 m).   Weight as of this encounter: 98.9 kg.   DVT Prophylaxis - Aspirin  Weight bearing as tolerated.  Will  consult ID for recommendations on management of infection. Synovasure results in prior progress note. Appreciate assistance.  Start physical therapy today.  R. Brinton Canavan, PA-C Orthopedic Surgery 12/13/2023, 8:35 AM

## 2023-12-13 NOTE — Progress Notes (Signed)
 Peripherally Inserted Central Catheter Placement  The IV Nurse has discussed with the patient and/or persons authorized to consent for the patient, the purpose of this procedure and the potential benefits and risks involved with this procedure.  The benefits include less needle sticks, lab draws from the catheter, and the patient may be discharged home with the catheter. Risks include, but not limited to, infection, bleeding, blood clot (thrombus formation), and puncture of an artery; nerve damage and irregular heartbeat and possibility to perform a PICC exchange if needed/ordered by physician.  Alternatives to this procedure were also discussed.  Bard Power PICC patient education guide, fact sheet on infection prevention and patient information card has been provided to patient /or left at bedside.    PICC Placement Documentation  PICC Single Lumen 12/13/23 Right Brachial 41 cm 0 cm (Active)  Indication for Insertion or Continuance of Line Home intravenous therapies (PICC only) 12/13/23 1800  Exposed Catheter (cm) 0 cm 12/13/23 1800  Site Assessment Clean, Dry, Intact 12/13/23 1800  Line Status Saline locked;Blood return noted 12/13/23 1800  Dressing Type Transparent;Securing device 12/13/23 1800  Dressing Status Antimicrobial disc/dressing in place;Clean, Dry, Intact 12/13/23 1800  Line Care Connections checked and tightened 12/13/23 1800  Line Adjustment (NICU/IV Team Only) No 12/13/23 1800  Dressing Intervention New dressing 12/13/23 1800  Dressing Change Due 12/20/23 12/13/23 1800       Lulu Sales Chenice 12/13/2023, 6:07 PM

## 2023-12-13 NOTE — Progress Notes (Addendum)
 PHARMACY CONSULT NOTE FOR:  OUTPATIENT  PARENTERAL ANTIBIOTIC THERAPY (OPAT)  Indication: R-knee PJI Regimen: Ampicillin  12g daily as a continuous infusion End date: 01/24/24 (6 weeks from start of abx on 5/1)  IV antibiotic discharge orders are pended. To discharging provider:  please sign these orders via discharge navigator,  Select New Orders & click on the button choice - Manage This Unsigned Work.     Thank you for allowing pharmacy to be a part of this patient's care.  Garland Junk, PharmD, BCPS, BCIDP Infectious Diseases Clinical Pharmacist 12/13/2023 2:45 PM   **Pharmacist phone directory can now be found on [email or web address] ([email or web address]).  Listed under [facility].

## 2023-12-13 NOTE — Progress Notes (Signed)
 Pharmacy: Antimicrobial Stewardship Note  33 YOF with R-knee PJI s/p OR 5/1 for I&D and polyexchange. Noted cultures drawn in the ortho office on 4/17 reflecting Enterococcus faecalis (sensitive to Ampicillin , in chart note by Perla Bradford 4/30).   The patient was not on antibiotics prior to admission. OR cultures today are pending. Will start ampicillin  based on recent office aspirate.   Renal function at boarder of antibiotic dose adjustment - SCr 1.14, nCrCl 50-55 ml/min. Will start with q4h dosing but monitor for need to adjust.   Plan - Start Ampicillin  2g IV every 4 hours - Will monitor renal function for necessary dose adjustments  Thank you for allowing pharmacy to be a part of this patient's care.  Garland Junk, PharmD, BCPS, BCIDP Infectious Diseases Clinical Pharmacist 12/13/2023 1:45 PM   **Pharmacist phone directory can now be found on [email or web address] ([email or web address]).  Listed under [facility].

## 2023-12-13 NOTE — Consult Note (Signed)
 [facility]    Date of Admission:  12/12/2023     Reason for Consult: right knee pji    Referring Provider: Aluisio     Lines:  piv  Abx: 5/1-c ampicillin   Periop cefazolin    Assessment: 73 yo 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, recent prolonged healing of sacral decub ulcer infection but no abx the past several months (initial abx then wound care), dental care a few months ago with abx prophylaxis, admitted with right pji of the knee  Sx of the knee swelling/pain is about 3 months 11/09/23 aspiration in ortho clinic grew e faecalis  She is s/p I&D with liner exchange 4/30; operative cx ngtd  She hasn't received any abx otherwise prior to I&D    Plan: Will plan for picc and iv ampicillin  for 6 weeks After the iv abx will transition to oral amoxicillin  1 gram tid for the next 5 months prior to transitioning to bid suppressive amox there after Maintain standard isolation precaution Picc ordered Once  hh setup, ok to discharge from id standpoint Discussed with primary team  OPAT Orders Discharge antibiotics to be given via PICC line  Ampicillin  for 6 weeks   End Date: 01/24/24, then transition to 5 months of amoxicillin  1 gram tid, then maintain bid suppression dose  [facility] Care Per Protocol:  Home health RN for IV administration and teaching; PICC line care and labs.    Labs weekly while on IV antibiotics: _x_ CBC with differential __ BMP _x_ CMP _x_ CRP _x_ ESR __ Vancomycin  trough __ CK  _x_ Please pull PIC at completion of IV antibiotics __ Please leave PIC in place until doctor has seen patient or been notified  Fax weekly labs to [phone]  Clinic Follow Up Appt: 5/27 @ 345pm  @  [facility] [address], [place], [place] [address] Phone: [phone]      ------------------------------------------------ Principal  Problem:   Infection of prosthetic right knee joint (HCC) Active Problems:   Infection of total right knee replacement (HCC)    HPI: [name] is a 73 y.o. 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, recent prolonged healing of sacral decub ulcer infection but no abx the past several months (initial abx then wound care), dental care a few months ago with abx prophylaxis, admitted with right pji of the knee   Patient had dental repair about 3 months ago with use of abx prophy She has a chronic sacral wound/right heel decub that healed also about 3 months ago  She did well with her right pji until 3-4 months prior when pain/swelling began to occur  No abx  She had aspirate 3/28 in ortho clinic that grew e faecalis and admitted for I&D with one stage poly exchange  Cx pending  Doing well  No pred/dmards with ra outside of plaquenil  for the past several years   Other issue mentioned included amio induced pneumotoxicity but no steroid required either. Amio stopped 2 weeks prior to admission   Family History  Problem Relation Age of Onset   Alzheimer's disease Mother    Heart attack Mother    Hypertension Mother    Glaucoma Mother    High Cholesterol Mother    Heart disease Mother    Depression Mother    Cancer Father    High Cholesterol  Father    Heart disease Father    Sleep apnea Father    Breast cancer Maternal Aunt    Heart attack Brother    Heart disease Brother    Glaucoma Brother    Hyperlipidemia Brother    Glaucoma Brother    Asthma Son    Allergic rhinitis Neg Hx    Angioedema Neg Hx    Eczema Neg Hx    Urticaria Neg Hx    Neuropathy Neg Hx     Social History   Tobacco Use   Smoking status: Never    Passive exposure: Past   Smokeless tobacco: Never  Vaping Use   Vaping status: Never Used  Substance Use Topics   Alcohol  use: Yes    Comment: occ   Drug use: No    Allergies  Allergen  Reactions   Sulfa Antibiotics Hives and Itching    Flushing   Cymbalta [Duloxetine Hcl] Other (See Comments)    Manic reaction   Demerol Hives and Other (See Comments)    Fever    Ivp Dye [Iodinated Contrast Media] Hives and Other (See Comments)    Fever   Morphine  And Codeine Other (See Comments)    ineffective   Sulfasalazine Hives and Other (See Comments)    Flushing   Adhesive [Tape] Rash    Review of Systems: ROS All Other ROS was negative, except mentioned above   Past Medical History:  Diagnosis Date   Agatston coronary artery calcium  score greater than 400 05/2022   coronary Ca score 856   Anemia    Back pain    Blood transfusion    1981   Cancer (HCC)    basal cell carcinoma, x 3   on leg and bottom of spine   Chronic diastolic CHF (congestive heart failure) (HCC)    diastolic    Chronic kidney disease    CKD3a   COVID    DDD (degenerative disc disease), cervical    DDD (degenerative disc disease), lumbar    Family history of coronary arteriosclerosis- strong fam h/o CAD and early CAD.  07/17/2017   Fibromyalgia    GERD (gastroesophageal reflux disease)    Heart murmur    as a child   Hiatal hernia    sjorgens syndrome   High blood pressure    High cholesterol    IBS (irritable bowel syndrome)    OSA (obstructive sleep apnea)    Osteoarthritis    Peripheral autonomic neuropathy of unknown cause    Pneumonia    Pre-diabetes    PVC (premature ventricular contraction)    Raynaud disease    Rheumatoid arthritis (HCC)    Sjogren's disease (HCC)    Urticaria    Vitamin D  deficiency        Scheduled Meds:  acetaminophen   1,000 mg Oral Q6H   aspirin   81 mg Oral BID   colestipol   2 g Oral Daily   docusate sodium   100 mg Oral BID   fenofibrate   54 mg Oral Daily   gabapentin   300 mg Oral Daily   gabapentin   600 mg Oral QHS   hydroxychloroquine   200 mg Oral Daily   loratadine   10 mg Oral Daily   metoprolol  succinate  12.5 mg Oral Daily    pantoprazole   80 mg Oral Daily   potassium chloride   20 mEq Oral Daily   rosuvastatin   10 mg Oral Daily   spironolactone   12.5 mg Oral Daily   Continuous Infusions:  sodium chloride  Stopped (12/13/23 0825)   ampicillin  (OMNIPEN) IV     PRN Meds:.acetaminophen , bisacodyl , diphenhydrAMINE , HYDROmorphone  (DILAUDID ) injection, menthol -cetylpyridinium **OR** phenol, methocarbamol  **OR** methocarbamol  (ROBAXIN ) injection, ondansetron  **OR** ondansetron  (ZOFRAN ) IV, oxyCODONE , polyethylene glycol, promethazine , sodium phosphate , traMADol    OBJECTIVE: Blood pressure (!) 144/63, pulse 66, temperature 98 F (36.7 C), resp. rate 18, height 5\' 1"  (1.549 m), weight 98.9 kg, SpO2 97%.  Physical Exam  General/constitutional: no distress, pleasant HEENT: Normocephalic, PER, Conj Clear, EOMI, Oropharynx clear Neck supple CV: rrr no mrg Lungs: clear to auscultation, normal respiratory effort Abd: Soft, Nontender Ext: no edema Skin: No Rash Neuro: nonfocal MSK: fingers with slight swan neck and ulnar deviation. Right knee in dressing that is c/d/i   Lab Results Lab Results  Component Value Date   WBC 8.7 12/13/2023   HGB 11.3 (L) 12/13/2023   HCT 35.9 (L) 12/13/2023   MCV 87.8 12/13/2023   PLT 249 12/13/2023    Lab Results  Component Value Date   CREATININE 1.14 (H) 12/13/2023   BUN 22 12/13/2023   NA 131 (L) 12/13/2023   K 4.1 12/13/2023   CL 99 12/13/2023   CO2 20 (L) 12/13/2023    Lab Results  Component Value Date   ALT 12 12/10/2023   AST 16 12/10/2023   ALKPHOS 88 12/10/2023   BILITOT 0.6 12/10/2023      Microbiology: Recent Results (from the past 240 hours)  Aerobic/Anaerobic Culture w Gram Stain (surgical/deep wound)     Status: None (Preliminary result)   Collection Time: 12/12/23  3:05 PM   Specimen: Soft Tissue, Other  Result Value Ref Range Status   Specimen Description   Final    TISSUE RIGHT KNEE CULTURE SWABS 1 AND 2 Performed at [facility], [address] [place], [place] [address]    Special Requests   Final    NONE Performed at [facility], [address], [place], [place] [address]    Gram Stain   Final    ABUNDANT WBC PRESENT, PREDOMINANTLY PMN NO ORGANISMS SEEN    Culture   Final    NO GROWTH < 24 HOURS Performed at [facility], [address], [place], [place] [address]    Report Status PENDING  Incomplete     Serology:    Imaging: If present, new imagings (plain films, ct scans, and mri) have been personally visualized and interpreted; radiology reports have been reviewed. Decision making incorporated into the Impression / Recommendations.    Jamesetta Mcbride, MD [facility] [phone] pager    12/13/2023, 1:58 PM

## 2023-12-14 DIAGNOSIS — L299 Pruritus, unspecified: Secondary | ICD-10-CM | POA: Diagnosis not present

## 2023-12-14 DIAGNOSIS — T8453XA Infection and inflammatory reaction due to internal right knee prosthesis, initial encounter: Secondary | ICD-10-CM | POA: Diagnosis not present

## 2023-12-14 DIAGNOSIS — B952 Enterococcus as the cause of diseases classified elsewhere: Secondary | ICD-10-CM | POA: Diagnosis not present

## 2023-12-14 LAB — CBC
HCT: 35 % — ABNORMAL LOW (ref 36.0–46.0)
Hemoglobin: 10.7 g/dL — ABNORMAL LOW (ref 12.0–15.0)
MCH: 27.7 pg (ref 26.0–34.0)
MCHC: 30.6 g/dL (ref 30.0–36.0)
MCV: 90.7 fL (ref 80.0–100.0)
Platelets: 282 10*3/uL (ref 150–400)
RBC: 3.86 MIL/uL — ABNORMAL LOW (ref 3.87–5.11)
RDW: 14.4 % (ref 11.5–15.5)
WBC: 9.9 10*3/uL (ref 4.0–10.5)
nRBC: 0 % (ref 0.0–0.2)

## 2023-12-14 MED ORDER — DOCUSATE SODIUM 100 MG PO CAPS
100.0000 mg | ORAL_CAPSULE | Freq: Two times a day (BID) | ORAL | Status: DC
  Filled 2023-12-14: qty 1

## 2023-12-14 MED ORDER — ASPIRIN 81 MG PO CHEW
81.0000 mg | CHEWABLE_TABLET | Freq: Two times a day (BID) | ORAL | Status: DC
  Administered 2023-12-14 – 2023-12-15 (×2): 81 mg via ORAL
  Filled 2023-12-14 (×2): qty 1

## 2023-12-14 MED ORDER — TRAMADOL HCL 50 MG PO TABS: 50.0000 mg | ORAL_TABLET | Freq: Four times a day (QID) | ORAL | 0 refills | Status: DC | PRN

## 2023-12-14 MED ORDER — METHOCARBAMOL 500 MG PO TABS: 500.0000 mg | ORAL_TABLET | Freq: Four times a day (QID) | ORAL | 0 refills | Status: DC | PRN

## 2023-12-14 MED ORDER — ASPIRIN 81 MG PO CHEW: 81.0000 mg | CHEWABLE_TABLET | Freq: Two times a day (BID) | ORAL | 0 refills | Status: DC

## 2023-12-14 MED ORDER — OXYCODONE HCL 5 MG PO TABS: 5.0000 mg | ORAL_TABLET | Freq: Four times a day (QID) | ORAL | 0 refills | Status: DC | PRN

## 2023-12-14 MED ORDER — ONDANSETRON HCL 4 MG PO TABS: 4.0000 mg | ORAL_TABLET | Freq: Four times a day (QID) | ORAL | 0 refills | Status: DC | PRN

## 2023-12-14 NOTE — Care Management Important Message (Signed)
 Important Message  Patient Details IM Letter given to the Patient Name: [name] MRN: [id number] Date of Birth: February 10, 1951   Important Message Given:  Yes - Medicare IM     Herndon Grill 12/14/2023, 9:39 AM

## 2023-12-14 NOTE — Progress Notes (Signed)
 Subjective: 2 Days Post-Op Procedure(s) (LRB): IRRIGATION AND DEBRIDEMENT KNEE WITH POLY EXCHANGE (Right) Patient seen in rounds by Dr. France Ina. Patient is well, and has had no acute complaints or problems. No issues overnight. Patient reports pain as moderate.    Objective: Vital signs in last 24 hours: Temp:  [98 F (36.7 C)-98.2 F (36.8 C)] 98.2 F (36.8 C) (05/02 0530) Pulse Rate:  [54-66] 55 (05/02 0530) Resp:  [17-18] 17 (05/02 0530) BP: (111-144)/(57-80) 127/63 (05/02 0530) SpO2:  [97 %-100 %] 100 % (05/02 0530)  Intake/Output from previous day:  Intake/Output Summary (Last 24 hours) at 12/14/2023 0801 Last data filed at 12/14/2023 0530 Gross per 24 hour  Intake 628.33 ml  Output 2600 ml  Net -1971.67 ml    Intake/Output this shift: No intake/output data recorded.  Labs: Recent Labs    12/13/23 0329 12/14/23 0322  HGB 11.3* 10.7*   Recent Labs    12/13/23 0329 12/14/23 0322  WBC 8.7 9.9  RBC 4.09 3.86*  HCT 35.9* 35.0*  PLT 249 282   Recent Labs    12/13/23 0329  NA 131*  K 4.1  CL 99  CO2 20*  BUN 22  CREATININE 1.14*  GLUCOSE 150*  CALCIUM  8.8*   No results for input(s): "LABPT", "INR" in the last 72 hours.  Exam: General - Patient is Alert and Oriented Extremity - Neurologically intact Neurovascular intact Sensation intact distally Dorsiflexion/Plantar flexion intact Dressing/Incision - clean, dry Motor Function - intact, moving foot and toes well on exam.   Past Medical History:  Diagnosis Date   Agatston coronary artery calcium  score greater than 400 05/2022   coronary Ca score 856   Anemia    Back pain    Blood transfusion    1981   Cancer (HCC)    basal cell carcinoma, x 3   on leg and bottom of spine   Chronic diastolic CHF (congestive heart failure) (HCC)    diastolic    Chronic kidney disease    CKD3a   COVID    DDD (degenerative disc disease), cervical    DDD (degenerative disc disease), lumbar    Family history  of coronary arteriosclerosis- strong fam h/o CAD and early CAD.  07/17/2017   Fibromyalgia    GERD (gastroesophageal reflux disease)    Heart murmur    as a child   Hiatal hernia    sjorgens syndrome   High blood pressure    High cholesterol    IBS (irritable bowel syndrome)    OSA (obstructive sleep apnea)    Osteoarthritis    Peripheral autonomic neuropathy of unknown cause    Pneumonia    Pre-diabetes    PVC (premature ventricular contraction)    Raynaud disease    Rheumatoid arthritis (HCC)    Sjogren's disease (HCC)    Urticaria    Vitamin D  deficiency     Assessment/Plan: 2 Days Post-Op Procedure(s) (LRB): IRRIGATION AND DEBRIDEMENT KNEE WITH POLY EXCHANGE (Right) Principal Problem:   Infection of prosthetic right knee joint (HCC) Active Problems:   Infection of total right knee replacement (HCC)  Estimated body mass index is 41.19 kg/m as calculated from the following:   Height as of this encounter: 5\' 1"  (1.549 m).   Weight as of this encounter: 98.9 kg.  DVT Prophylaxis - Aspirin  Weight-bearing as tolerated.  PICC placed yesterday. Appreciate ID's recommendations.  Continue physical therapy while admitted. Expected discharge home today pending progress. HH services arranged. F/u in clinic  in 2 weeks.  The PDMP database was reviewed today prior to any opioid medications being prescribed to this patient.  R. Brinton Canavan, PA-C Orthopedic Surgery 12/14/2023, 8:01 AM

## 2023-12-14 NOTE — Progress Notes (Addendum)
 ID brief note  Secure chatted by RN regarding report of itching, as well as redness in the left elbow, bilateral cheeks and upper eyelids.   Afebrile CBC today with hemoglobin 10.7.  No BMP and CMP  Patient being treated for right knee PJI, antibiotics switched to IV ampicillin  starting yesterday afternoon.  She denies any signs or symptoms suggestive of type I hypersensitivity reaction like generalized hives, shortness of breath, facial swelling or lip swelling. Does not appear to be type 3 delayed hypersensitivity either.   She however noted itching while walking with PT this morning, redness in the left elbow area where she previously had an IV line placed with tape and reports history of allergy  to tape and could be related  No hives or similar rashes at other sites of the body including trunk, right upper extremity and lower extremities.   She has some bilateral redness in her cheeks and bilateral eyelids, unclear cause   Rt knee surgical site C/D/I, distal NV status intact  She reports getting Benadryl  at 11 AM and feels her itching has improved and tolerating IV ampicillin  without concerns.  She reports she has taken p.o. amoxicillin /clavulanate in the past without any concerns except some nausea  Discussed less likely an allergic reaction to IV ampicillin  and it would be preferable to continue IV ampicillin  with close monitoring for next 24 hours, until tomorrow.  If no concerns by tomorrow afternoon, no change in OPAT.  If any new concerns for allergy , we will likely change to daptomycin  Discussed with primary team as well as RN and ID pharmacist  Patient is agreeable with the plan  Terre Ferri, MD Infectious Disease Physician [facility] [address] [place], [place] [address] Phone: [phone]  Fax: [phone]

## 2023-12-14 NOTE — Progress Notes (Signed)
 Physical Therapy Treatment Patient Details Name: [name] MRN: [id number] DOB: Dec 27, 1950 Today's Date: 12/14/2023   History of Present Illness [name], 73 y.o. 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 The knee was initially fixed in 09/2010. She then had an incident two years ago, in November 2023, where she had to have an ORIF of a right femur fracture. She reports that the knee has been painful and hot since the fracture fixation, with significant swelling that has persisted since the surgery. The swelling has worsened over the last three to four months.    PT Comments  Pt is progressing well with therapy. Pt reports her son is available to assist with needs. Pt has a ramped entrance and necessary DME. Pt is supervision with gait in the hall with RW. Pt educated on HEP and given a handout. Pt demonstrated good understanding. Pt is supervision with bed mobility using the bed rails and HOB elevated. Pt does have a hospital bed at home. Pt is safe to d/c home today with her son providing some assistance as needed and follow-up HHPT services.    If plan is discharge home, recommend the following: A little help with walking and/or transfers;A little help with bathing/dressing/bathroom;Assistance with cooking/housework;Assist for transportation;Help with stairs or ramp for entrance   Can travel by private vehicle        Equipment Recommendations       Recommendations for Other Services       Precautions / Restrictions Precautions Precautions: Fall Recall of Precautions/Restrictions: Intact Restrictions Weight Bearing Restrictions Per Provider Order: No RLE Weight Bearing Per Provider Order: Weight bearing as tolerated     Mobility  Bed Mobility Overal bed mobility: Needs Assistance Bed Mobility: Sit to Supine     Supine to sit: HOB elevated, Used rails, Supervision Sit to supine: HOB elevated, Used rails, Supervision   General bed mobility  comments: HOB elevated, has hospital bed at home    Transfers Overall transfer level: Needs assistance Equipment used: Rolling walker (2 wheels) Transfers: Sit to/from Stand, Bed to chair/wheelchair/BSC Sit to Stand: Supervision           General transfer comment: incresed difficulty to stand from lower surfaces, discussed sitting in higher chairs and use of arm rests to push up.    Ambulation/Gait Ambulation/Gait assistance: Supervision Gait Distance (Feet): 150 Feet Assistive device: Rolling walker (2 wheels) Gait Pattern/deviations: Step-to pattern, Decreased stride length, Antalgic Gait velocity: dec         Stairs Stairs:  (discussed technique with curbs, pt has a ramp at home)           Wheelchair Mobility     Tilt Bed    Modified Rankin (Stroke Patients Only)       Balance Overall balance assessment: Needs assistance Sitting-balance support: Feet supported, No upper extremity supported Sitting balance-Leahy Scale: Good     Standing balance support: Reliant on assistive device for balance, No upper extremity supported Standing balance-Leahy Scale: Fair                              Hotel manager: No apparent difficulties  Cognition Arousal: Alert Behavior During Therapy: WFL for tasks assessed/performed   PT - Cognitive impairments: No apparent impairments                         Following commands: Intact  Cueing Cueing Techniques: Verbal cues, Visual cues  Exercises Total Joint Exercises Ankle Circles/Pumps: AROM, Both, 10 reps, Supine Quad Sets: AROM, Strengthening, Right, 10 reps, Supine Hip ABduction/ADduction: AROM, Strengthening, Right, 10 reps, Supine Straight Leg Raises: AAROM, Strengthening, Right, Supine, 10 reps Long Arc Quad: AROM, Strengthening, Right, 10 reps, Seated Knee Flexion: AAROM, Strengthening, Right, Seated, 5 reps Goniometric ROM: 0-50*    General Comments         Pertinent Vitals/Pain Pain Assessment Pain Assessment: 0-10 Pain Score: 4  Pain Location: R knee Pain Descriptors / Indicators: Aching, Constant, Discomfort, Operative site guarding Pain Intervention(s): Limited activity within patient's tolerance, Monitored during session, Repositioned    Home Living                          Prior Function            PT Goals (current goals can now be found in the care plan section) Progress towards PT goals: Progressing toward goals    Frequency           PT Plan      Co-evaluation              AM-PAC PT "6 Clicks" Mobility   Outcome Measure  Help needed turning from your back to your side while in a flat bed without using bedrails?: A Little Help needed moving from lying on your back to sitting on the side of a flat bed without using bedrails?: A Little Help needed moving to and from a bed to a chair (including a wheelchair)?: A Little Help needed standing up from a chair using your arms (e.g., wheelchair or bedside chair)?: A Little Help needed to walk in hospital room?: A Little Help needed climbing 3-5 steps with a railing? : A Little 6 Click Score: 18    End of Session Equipment Utilized During Treatment: Gait belt Activity Tolerance: Patient tolerated treatment well Patient left: in bed;with call bell/phone within reach Nurse Communication: Mobility status PT Visit Diagnosis: Difficulty in walking, not elsewhere classified (R26.2);Pain Pain - Right/Left: Right Pain - part of body: Knee     Time: 0930-1005 PT Time Calculation (min) (ACUTE ONLY): 35 min  Charges:    $Gait Training: 8-22 mins $Therapeutic Exercise: 8-22 mins PT General Charges $$ ACUTE PT VISIT: 1 Visit                      [name] 12/14/2023, 10:18 AM

## 2023-12-14 NOTE — Progress Notes (Signed)
 [facility] Infectious Diseases Follow Up Note  Patient Identification: Patient Name: [name] MRN: [id number] Admit Date: 12/12/2023 11:27 AM Age: 73 y.o.Today's Date: 12/14/2023  Reason for Visit: Right knee prosthetic joint infection, itching, ? rash  Principal Problem:   Infection of prosthetic right knee joint (HCC) Active Problems:   Infection of total right knee replacement (HCC)   Itching   Antibiotics: Ampicillin  5/1- Cefazolin  4/30  Lines/Hardwares: Right arm PICC line  Interval Events: Continues to be afebrile, 5/1 Cr 1.14, 5/2 hemoglobin 10.7  Assessment 73 year old female with prior history of basal cell carcinoma, CHF, CKD, DDD, GERD, Sjogren's syndrome, RA, Raynaud's phenomenon ( on Plaquenil ) HTN, HLD, obesity/OSA, OA, prediabetes, right knee arthroplasty, prolonged healing of sacral decub infection (managed with initial antibiotics and wound care, no antibiotics in the last several months) admitted with   # Right knee PJI - 3/28 aspiration in Ortho clinic with E faecalis - 4/30 I&D with liner exchange.  Or culture with E faecalis - No antibiotics prior to I&D - Started on IV ampicillin  since yesterday with some itching and redness noted this morning while walking with PT ( see my another progress note for details) - Unlikely to be allergic to IV ampicillin  and she is okay to stay in-house to monitor until tomorrow to make sure no new concerns  Recommendations - Continue IV ampicillin  as in OPAT with close monitoring, have discussed with RN - If any new concerns regarding allergy , will need to switch IV ampicillin  to daptomycin - Monitor CBC and CMP - If no concerns by tomorrow afternoon, OK to discharge on IV ampicillin  from ID standpoint - Universal/standard isolation precautions  D/w primary team  Dr Zelda Hickman covering this weekend and will follow OR cultures. Please reach out with questions or  concerns  Rest of the management as per the primary team. Thank you for the consult. Please page with pertinent questions or concerns.  ______________________________________________________________________ Subjective patient seen and examined at the bedside. Some itching, redness in left elbow and b/l cheeks and upper eye lids this morning. Son at bedside.   Past Medical History:  Diagnosis Date   Agatston coronary artery calcium  score greater than 400 05/2022   coronary Ca score 856   Anemia    Back pain    Blood transfusion    1981   Cancer (HCC)    basal cell carcinoma, x 3   on leg and bottom of spine   Chronic diastolic CHF (congestive heart failure) (HCC)    diastolic    Chronic kidney disease    CKD3a   COVID    DDD (degenerative disc disease), cervical    DDD (degenerative disc disease), lumbar    Family history of coronary arteriosclerosis- strong fam h/o CAD and early CAD.  07/17/2017   Fibromyalgia    GERD (gastroesophageal reflux disease)    Heart murmur    as a child   Hiatal hernia    sjorgens syndrome   High blood pressure    High cholesterol    IBS (irritable bowel syndrome)    OSA (obstructive sleep apnea)    Osteoarthritis    Peripheral autonomic neuropathy of unknown cause    Pneumonia    Pre-diabetes    PVC (premature ventricular contraction)    Raynaud disease    Rheumatoid arthritis (HCC)    Sjogren's disease (HCC)    Urticaria    Vitamin D  deficiency    Past Surgical History:  Procedure Laterality Date  ABDOMINAL HYSTERECTOMY     BTL, BSO   ADENOIDECTOMY     BREAST EXCISIONAL BIOPSY Left    BREAST SURGERY     mass removal    CHOLECYSTECTOMY     dental implants     DIAGNOSTIC LAPAROSCOPY     x3   FEMUR FRACTURE SURGERY Right    I & D KNEE WITH POLY EXCHANGE Right 12/12/2023   Procedure: IRRIGATION AND DEBRIDEMENT KNEE WITH POLY EXCHANGE;  Surgeon: Liliane Rei, MD;  Location: [facility];  Service: Orthopedics;  Laterality: Right;    KNEE ARTHROSCOPY     x2   MASS EXCISION Left 03/22/2017   Procedure: EXCISION OF LEFT BREAST MASS;  Surgeon: Dareen Ebbing, MD;  Location: [facility];  Service: General;  Laterality: Left;   MULTIPLE TOOTH EXTRACTIONS  06/2023   x5   ORIF FEMUR FRACTURE Right 06/22/2022   Procedure: RIGHT OPEN REDUCTION INTERNAL FIXATION (ORIF) DISTAL FEMUR FRACTURE;  Surgeon: Hardy Lia, MD;  Location: [facility];  Service: Orthopedics;  Laterality: Right;   patotid cystectomy     RIGHT/LEFT HEART CATH AND CORONARY ANGIOGRAPHY N/A 07/25/2019   Procedure: RIGHT/LEFT HEART CATH AND CORONARY ANGIOGRAPHY;  Surgeon: Mardell Shade, MD;  Location: [facility];  Service: Cardiovascular;  Laterality: N/A;   TENDON REPAIR  1980   left ankle and tibia   TONSILLECTOMY     TOTAL KNEE ARTHROPLASTY  06/21/2011   Procedure: TOTAL KNEE ARTHROPLASTY;  Surgeon: Aurther Blue;  Location: [facility];  Service: Orthopedics;  Laterality: Right;   TUBAL LIGATION      Vitals BP (!) 140/69 (BP Location: Left Arm)   Pulse (!) 57   Temp 97.8 F (36.6 C) (Oral)   Resp 16   Ht 5\' 1"  (1.549 m)   Wt 98.9 kg   SpO2 100%   BMI 41.19 kg/m     Physical Exam Constitutional: Elderly female sitting in the bed, comfortable not in acute distress    Comments: HEENT WNL  Cardiovascular:     Rate and Rhythm: Normal rate and regular rhythm.     Heart sounds: S1 and S2  Pulmonary:     Effort: Pulmonary effort is normal.     Comments: Normal breath sounds  Abdominal:     Palpations: Abdomen is soft.     Tenderness: Nondistended and nontender  Musculoskeletal:        General: No swelling or tenderness in peripheral joints. Rt knee surgical site C/D/I.   Skin:    Comments: Faint redness in the left elbow where she previously had an IV line from surgery with a tape.  No cellulitis.  Bilateral upper eyelids and cheeks with mild redness  No rashes at other sites of the body including trunk, right upper extremity and lower  extremities.  No oral sores or ulcers  Neurological:     General: Awake, alert and oriented, grossly nonfocal  Psychiatric:        Mood and Affect: Mood normal.   Pertinent Microbiology Results for orders placed or performed during the hospital encounter of 12/12/23  Aerobic/Anaerobic Culture w Gram Stain (surgical/deep wound)     Status: None (Preliminary result)   Collection Time: 12/12/23  3:05 PM   Specimen: Soft Tissue, Other  Result Value Ref Range Status   Specimen Description   Final    TISSUE RIGHT KNEE CULTURE SWABS 1 AND 2 Performed at [facility], [address] [place], [place] [address]  Special Requests   Final    NONE Performed at [facility], [address] [place], [place] [address]    Gram Stain   Final    ABUNDANT WBC PRESENT, PREDOMINANTLY PMN NO ORGANISMS SEEN Performed at [facility], [address], [place], [place] [address]    Culture   Final    RARE ENTEROCOCCUS FAECALIS SUSCEPTIBILITIES TO FOLLOW NO ANAEROBES ISOLATED; CULTURE IN PROGRESS FOR 5 DAYS    Report Status PENDING  Incomplete   *Note: Due to a large number of results and/or encounters for the requested time period, some results have not been displayed. A complete set of results can be found in Results Review.   Pertinent Lab.    Latest Ref Rng & Units 12/14/2023    3:22 AM 12/13/2023    3:29 AM 12/10/2023   11:22 AM  CBC  WBC 4.0 - 10.5 K/uL 9.9  8.7  6.7   Hemoglobin 12.0 - 15.0 g/dL 09.8  11.9  14.7   Hematocrit 36.0 - 46.0 % 35.0  35.9  38.1   Platelets 150 - 400 K/uL 282  249  243       Latest Ref Rng & Units 12/13/2023    3:29 AM 12/10/2023   11:22 AM 10/23/2023   11:20 AM  CMP  Glucose 70 - 99 mg/dL 829  562  95   BUN 8 - 23 mg/dL 22  21  36   Creatinine 0.44 - 1.00 mg/dL 1.30  8.65  7.84   Sodium 135 - 145 mmol/L 131  137  138   Potassium 3.5 - 5.1 mmol/L 4.1  4.3  4.8   Chloride 98 - 111 mmol/L 99  104  102   CO2 22 - 32  mmol/L 20  24  19    Calcium  8.9 - 10.3 mg/dL 8.8  9.0  9.5   Total Protein 6.5 - 8.1 g/dL  7.2  7.2   Total Bilirubin 0.0 - 1.2 mg/dL  0.6  0.3   Alkaline Phos 38 - 126 U/L  88  117   AST 15 - 41 U/L  16  15   ALT 0 - 44 U/L  12  9      Pertinent Imaging today Plain films and CT images have been personally visualized and interpreted; radiology reports have been reviewed. Decision making incorporated into the Impression /   US  EKG SITE RITE Result Date: 12/13/2023 If Site Rite image not attached, placement could not be confirmed due to current cardiac rhythm.  CT Chest High Resolution Result Date: 12/10/2023 CLINICAL DATA:  Shortness of breath, interstitial lung disease. Abnormal pulmonary function testing. EXAM: CT CHEST WITHOUT CONTRAST TECHNIQUE: Multidetector CT imaging of the chest was performed following the standard protocol without intravenous contrast. High resolution imaging of the lungs, as well as inspiratory and expiratory imaging, was performed. RADIATION DOSE REDUCTION: This exam was performed according to the departmental dose-optimization program which includes automated exposure control, adjustment of the mA and/or kV according to patient size and/or use of iterative reconstruction technique. COMPARISON:  Cardiac CT 07/17/2022. FINDINGS: Cardiovascular: Atherosclerotic calcification of the aorta, aortic valve and coronary arteries. Enlarged pulmonic trunk and heart. No pericardial effusion. Mediastinum/Nodes: No pathologically enlarged mediastinal or axillary lymph nodes. Hilar regions are difficult to definitively evaluate without IV contrast. Esophagus is grossly unremarkable. Lungs/Pleura: Negative for subpleural reticulation, traction bronchiectasis/bronchiolectasis, ground glass, architectural distortion or honeycombing. Scarring in the left lower lobe. No pleural fluid. Airway  is unremarkable. Mild air trapping. Borderline tracheobronchomalacia. Upper Abdomen: Cholecystectomy.  Fat density 2.2 cm right adrenal myelolipoma. No specific follow-up necessary. Visualized portions of the liver, adrenal glands, spleen, pancreas, stomach and bowel are otherwise grossly unremarkable. No upper abdominal adenopathy. Musculoskeletal: Degenerative changes in the spine. IMPRESSION: 1. No evidence of interstitial lung disease. Mild air trapping is indicative of small airways disease. 2. Borderline tracheobronchomalacia. 3. Right adrenal myelolipoma. 4. Aortic atherosclerosis (ICD10-I70.0). Coronary artery calcification. 5. Enlarged pulmonic trunk, indicative of pulmonary arterial hypertension. Electronically Signed   By: Shearon Denis M.D.   On: 12/10/2023 15:50    I have personally spent 53  minutes involved in face-to-face and non-face-to-face activities for this patient on the day of the visit. Professional time spent includes the following activities: Preparing to see the patient (review of tests), Obtaining and/or reviewing separately obtained history (admission/discharge record), Performing a medically appropriate examination and/or evaluation , Ordering medications/tests/procedures, referring and communicating with other health care professionals, Documenting clinical information in the EMR, Independently interpreting results (not separately reported), Communicating results to the patient/family/caregiver, Counseling and educating the patient/family/caregiver and Care coordination (not separately reported).   Plan d/w requesting provider as well as ID pharm D  Of note, portions of this note may have been created with voice recognition software. While this note has been edited for accuracy, occasional wrong-word or 'sound-a-like' substitutions may have occurred due to the inherent limitations of voice recognition software.   Electronically signed by:   Terre Ferri, MD Infectious Disease Physician [facility] for Infectious Disease Pager: [phone]

## 2023-12-14 NOTE — Progress Notes (Signed)
 Pt placed on CPAP at this time. Pt is tolerating well with no distress noted. Will continue to monitor pt.    12/14/23 2312  BiPAP/CPAP/SIPAP  BiPAP/CPAP/SIPAP Pt Type Adult  BiPAP/CPAP/SIPAP Resmed  Mask Type Full face mask  Dentures removed? Not applicable  Mask Size Medium  Patient Home Machine No  Patient Home Mask No  Patient Home Tubing No  Auto Titrate Yes  CPAP/SIPAP surface wiped down Yes  Device Plugged into RED Power Outlet Yes  BiPAP/CPAP /SiPAP Vitals  Pulse Rate (!) 59  Resp 20  SpO2 100 %  Bilateral Breath Sounds Clear;Diminished  MEWS Score/Color  MEWS Score 0  MEWS Score Color Marrie Sizer

## 2023-12-14 NOTE — Plan of Care (Signed)
  Problem: Education: Goal: Knowledge of General Education information will improve Description: Including pain rating scale, medication(s)/side effects and non-pharmacologic comfort measures Outcome: Progressing   Problem: Clinical Measurements: Goal: Respiratory complications will improve Outcome: Progressing   Problem: Coping: Goal: Level of anxiety will decrease Outcome: Progressing   

## 2023-12-15 DIAGNOSIS — G4733 Obstructive sleep apnea (adult) (pediatric): Secondary | ICD-10-CM | POA: Diagnosis not present

## 2023-12-15 DIAGNOSIS — K2100 Gastro-esophageal reflux disease with esophagitis, without bleeding: Secondary | ICD-10-CM | POA: Diagnosis not present

## 2023-12-15 DIAGNOSIS — D631 Anemia in chronic kidney disease: Secondary | ICD-10-CM | POA: Diagnosis not present

## 2023-12-15 DIAGNOSIS — E782 Mixed hyperlipidemia: Secondary | ICD-10-CM | POA: Diagnosis not present

## 2023-12-15 DIAGNOSIS — M5030 Other cervical disc degeneration, unspecified cervical region: Secondary | ICD-10-CM | POA: Diagnosis not present

## 2023-12-15 DIAGNOSIS — B952 Enterococcus as the cause of diseases classified elsewhere: Secondary | ICD-10-CM | POA: Diagnosis not present

## 2023-12-15 DIAGNOSIS — M069 Rheumatoid arthritis, unspecified: Secondary | ICD-10-CM | POA: Diagnosis not present

## 2023-12-15 DIAGNOSIS — S82001D Unspecified fracture of right patella, subsequent encounter for closed fracture with routine healing: Secondary | ICD-10-CM | POA: Diagnosis not present

## 2023-12-15 DIAGNOSIS — T8453XA Infection and inflammatory reaction due to internal right knee prosthesis, initial encounter: Secondary | ICD-10-CM | POA: Diagnosis not present

## 2023-12-15 DIAGNOSIS — N1831 Chronic kidney disease, stage 3a: Secondary | ICD-10-CM | POA: Diagnosis not present

## 2023-12-15 DIAGNOSIS — M9711XD Periprosthetic fracture around internal prosthetic right knee joint, subsequent encounter: Secondary | ICD-10-CM | POA: Diagnosis not present

## 2023-12-15 DIAGNOSIS — K449 Diaphragmatic hernia without obstruction or gangrene: Secondary | ICD-10-CM | POA: Diagnosis not present

## 2023-12-15 DIAGNOSIS — M51379 Other intervertebral disc degeneration, lumbosacral region without mention of lumbar back pain or lower extremity pain: Secondary | ICD-10-CM | POA: Diagnosis not present

## 2023-12-15 DIAGNOSIS — E559 Vitamin D deficiency, unspecified: Secondary | ICD-10-CM | POA: Diagnosis not present

## 2023-12-15 DIAGNOSIS — M47816 Spondylosis without myelopathy or radiculopathy, lumbar region: Secondary | ICD-10-CM | POA: Diagnosis not present

## 2023-12-15 DIAGNOSIS — I5032 Chronic diastolic (congestive) heart failure: Secondary | ICD-10-CM | POA: Diagnosis not present

## 2023-12-15 DIAGNOSIS — M3500 Sicca syndrome, unspecified: Secondary | ICD-10-CM | POA: Diagnosis not present

## 2023-12-15 DIAGNOSIS — J8410 Pulmonary fibrosis, unspecified: Secondary | ICD-10-CM | POA: Diagnosis not present

## 2023-12-15 DIAGNOSIS — I130 Hypertensive heart and chronic kidney disease with heart failure and stage 1 through stage 4 chronic kidney disease, or unspecified chronic kidney disease: Secondary | ICD-10-CM | POA: Diagnosis not present

## 2023-12-15 DIAGNOSIS — F419 Anxiety disorder, unspecified: Secondary | ICD-10-CM | POA: Diagnosis not present

## 2023-12-15 DIAGNOSIS — K589 Irritable bowel syndrome without diarrhea: Secondary | ICD-10-CM | POA: Diagnosis not present

## 2023-12-15 DIAGNOSIS — M797 Fibromyalgia: Secondary | ICD-10-CM | POA: Diagnosis not present

## 2023-12-15 DIAGNOSIS — I493 Ventricular premature depolarization: Secondary | ICD-10-CM | POA: Diagnosis not present

## 2023-12-15 DIAGNOSIS — D509 Iron deficiency anemia, unspecified: Secondary | ICD-10-CM | POA: Diagnosis not present

## 2023-12-15 MED ORDER — HEPARIN SOD (PORK) LOCK FLUSH 100 UNIT/ML IV SOLN
250.0000 [IU] | INTRAVENOUS | Status: AC | PRN
  Administered 2023-12-15: 250 [IU]

## 2023-12-15 NOTE — Progress Notes (Signed)
 ]        [facility]  Date of Admission:  12/12/2023   Total days of inpatient antibiotics 2  Principal Problem:   Infection of prosthetic right knee joint (HCC) Active Problems:   Infection of total right knee replacement (HCC)   Itching          Assessment:  73 year old female with prior history of basal cell carcinoma, CHF, CKD, DDD, GERD, Sjogren's syndrome, RA, Raynaud's phenomenon ( on Plaquenil ) HTN, HLD, obesity/OSA, OA, prediabetes, right knee arthroplasty, prolonged healing of sacral decub infection (managed with initial antibiotics and wound care, no antibiotics in the last several months) admitted with    # Right knee PJI - 3/28 aspiration in Ortho clinic with E faecalis - 4/30 I&D with liner exchange.  Or culture with E faecalis - No antibiotics prior to I&D - Started on IV ampicillin  since yesterday with some itching and redness noted this morning while walking with PT ( see my another progress note for details) - Unlikely to be allergic to IV ampicillin  as itching has resolved. Pt amenable to continueing amp Recommendations: -Ampicllin x 6 weeks from OR EOT 6/12 then suppressive amox 1gm tid -F/U with Dr. Gillian Lacrosse on 5/27. OPAT orders as placed on 12/13/23 -Standard precaution  Evaluation of this patient requires complex antimicrobial therapy evaluation and counseling + isolation needs for disease transmission risk assessment and mitigation    Microbiology:   Antibiotics: ampiclline   SUBJECTIVE: Resting in bed. Family at bedside. Itching resolved   Review of Systems: Review of Systems  All other systems reviewed and are negative.    Scheduled Meds:  aspirin   81 mg Oral BID   Chlorhexidine  Gluconate Cloth  6 each Topical Daily   colestipol   2 g Oral Daily   docusate sodium   100 mg Oral BID   fenofibrate   54 mg Oral Daily   gabapentin   300 mg Oral Daily   gabapentin   600 mg Oral QHS   hydroxychloroquine   200 mg Oral Daily    loratadine   10 mg Oral Daily   metoprolol  succinate  12.5 mg Oral Daily   pantoprazole   80 mg Oral Daily   potassium chloride   20 mEq Oral Daily   rosuvastatin   10 mg Oral Daily   spironolactone   12.5 mg Oral Daily   Continuous Infusions:  sodium chloride  Stopped (12/13/23 0825)   ampicillin  (OMNIPEN) IV 2 g (12/15/23 1233)   PRN Meds:.acetaminophen , bisacodyl , diphenhydrAMINE , HYDROmorphone  (DILAUDID ) injection, menthol -cetylpyridinium **OR** phenol, methocarbamol  **OR** methocarbamol  (ROBAXIN ) injection, ondansetron  **OR** ondansetron  (ZOFRAN ) IV, oxyCODONE , polyethylene glycol, promethazine , sodium chloride  flush, sodium phosphate , traMADol  Allergies  Allergen Reactions   Sulfa Antibiotics Hives and Itching    Flushing   Cymbalta [Duloxetine Hcl] Other (See Comments)    Manic reaction   Demerol Hives and Other (See Comments)    Fever    Ivp Dye [Iodinated Contrast Media] Hives and Other (See Comments)    Fever   Morphine  And Codeine Other (See Comments)    ineffective   Sulfasalazine Hives and Other (See Comments)    Flushing   Adhesive [Tape] Rash    OBJECTIVE: Vitals:   12/14/23 1958 12/14/23 2137 12/14/23 2312 12/15/23 0522  BP:  (!) 130/91  (!) 142/64  Pulse:  (!) 58 (!) 59 (!) 58  Resp:  18 20 18   Temp:  98.3 F (36.8 C)  97.8 F (36.6 C)  TempSrc:  Oral  Oral  SpO2: 100% 100% 100% 98%  Weight:      Height:       Body mass index is 41.19 kg/m.  Physical Exam Constitutional:      Appearance: Normal appearance.  HENT:     Head: Normocephalic and atraumatic.     Right Ear: Tympanic membrane normal.     Left Ear: Tympanic membrane normal.     Nose: Nose normal.     Mouth/Throat:     Mouth: Mucous membranes are moist.  Eyes:     Extraocular Movements: Extraocular movements intact.     Conjunctiva/sclera: Conjunctivae normal.     Pupils: Pupils are equal, round, and reactive to light.  Cardiovascular:     Rate and Rhythm: Normal rate and regular  rhythm.     Heart sounds: No murmur heard.    No friction rub. No gallop.  Pulmonary:     Effort: Pulmonary effort is normal.     Breath sounds: Normal breath sounds.  Abdominal:     General: Abdomen is flat.     Palpations: Abdomen is soft.  Musculoskeletal:     Comments: Rt knee wound  Skin:    General: Skin is warm and dry.  Neurological:     General: No focal deficit present.     Mental Status: She is alert and oriented to person, place, and time.  Psychiatric:        Mood and Affect: Mood normal.       Lab Results Lab Results  Component Value Date   WBC 9.9 12/14/2023   HGB 10.7 (L) 12/14/2023   HCT 35.0 (L) 12/14/2023   MCV 90.7 12/14/2023   PLT 282 12/14/2023    Lab Results  Component Value Date   CREATININE 1.14 (H) 12/13/2023   BUN 22 12/13/2023   NA 131 (L) 12/13/2023   K 4.1 12/13/2023   CL 99 12/13/2023   CO2 20 (L) 12/13/2023    Lab Results  Component Value Date   ALT 12 12/10/2023   AST 16 12/10/2023   ALKPHOS 88 12/10/2023   BILITOT 0.6 12/10/2023        Orlie Bjornstad, MD [facility] 12/15/2023, 6:44 PM

## 2023-12-15 NOTE — Progress Notes (Signed)
 Pt states she is not itching today.  No redness to face seen today.  Left arm with a slight redness, much less than yesterday.  Patient has questions about the redness yesterday. ID MD notified of questions and will be in to see the patient before discharge.

## 2023-12-15 NOTE — Progress Notes (Signed)
 Subjective: 3 Days Post-Op Procedure(s) (LRB): IRRIGATION AND DEBRIDEMENT KNEE WITH POLY EXCHANGE (Right)  Patient reports pain as mild to moderate.  Denies fever, chills, N/V, CP, SOB.  Tolerating POs well.  Admits to flatus.  States that she is doing well from an orthopedic standpoint but has concerns over redness on her face that is slowly improving.    Objective:   VITALS:  Temp:  [97.8 F (36.6 C)-98.3 F (36.8 C)] 97.8 F (36.6 C) (05/03 0522) Pulse Rate:  [57-59] 58 (05/03 0522) Resp:  [16-20] 18 (05/03 0522) BP: (130-142)/(64-91) 142/64 (05/03 0522) SpO2:  [98 %-100 %] 98 % (05/03 0522)  General: WDWN patient in NAD. Psych:  Appropriate mood and affect. Neuro:  A&O x 3, Moving all extremities, sensation intact to light touch HEENT:  EOMs intact Chest:  Even non-labored respirations Skin:  Aquacel dressing C/D/I, no rashes or lesions Extremities: warm/dry, mild edema to R knee, no erythema or echymosis.  No lymphadenopathy. Pulses: Popliteus 2+ MSK:  ROM: lacks 5 degrees TKE, MMT: able to perform quad set, (-) Homan's    LABS Recent Labs    12/13/23 0329 12/14/23 0322  HGB 11.3* 10.7*  WBC 8.7 9.9  PLT 249 282   Recent Labs    12/13/23 0329  NA 131*  K 4.1  CL 99  CO2 20*  BUN 22  CREATININE 1.14*  GLUCOSE 150*   No results for input(s): "LABPT", "INR" in the last 72 hours.   Assessment/Plan: 3 Days Post-Op Procedure(s) (LRB): IRRIGATION AND DEBRIDEMENT KNEE WITH POLY EXCHANGE (Right)  Patient seen in rounds for Dr. France Ina DVT ppx: ASA WBAT  ABX per ID.  Monitoring patient due to facial redness.  Plan to D/C home with [facility] services later this afternoon if cleared by ID. Plan for 2 week outpatient post-op visit. D/C order placed.  If needs to be discontinued please inform me.  Adelfa Adolph PA-C [facility] Office:  [phone]

## 2023-12-15 NOTE — Discharge Summary (Signed)
 Physician Discharge Summary  Patient ID: ADAMA COREA MRN: [id number] DOB/AGE: 1951/07/26 73 y.o.  Admit date: 12/12/2023 Discharge date: 12/15/2023  Admission Diagnoses: Infection of prosthetic right knee joint; itching; hx of heart murmur, OSA, RA, hiatal hernia, fibormyalgia, PVC's, obesity, peripheral neuropathy, CHF, GERD, impaired glucose tolerance, sjorgren's syndrome, gait abnormality, lumbar OA, eosinophilic esophagitis, raynaud's disease, vit D def, hyperlipidemia, prediabetes, chronic maxillary sinusitus, situational anxiety, IDA, normorcytic anemia, and IBS.  Discharge Diagnoses:  Principal Problem:   Infection of prosthetic right knee joint (HCC) Active Problems:   Infection of total right knee replacement (HCC)   Itching Same as above.  Discharged Condition: stable  Hospital Course: Patient presented to [facility] OR for elective R knee I&D with poly exchange by Dr. France Ina on 12/12/23.  She tolerated the procedure well without complication.  She was then admitted to the hospital.  Aspirate knee culture prior to surgery demonstrates E faecalis.  ID consulted.  PICC line placed and patient on IV ampicillin  per ID recommendation.  There was concerns over redness on her face.  Monitored by ID and patient D/C'd home with [facility] services IV ampicillin .  Patient tolerated her stay well without complication.  Consults: ID  Significant Diagnostic Studies: microbiology: wound culture: positive for E faecalis  Treatments: IV hydration, antibiotics: Ancef  and ampicillin , analgesia: acetaminophen , Dilaudid , and oxycodone , cardiac meds: spironolactone , anticoagulation: ASA, procedures: PICC line, and surgery: as stated above.  Discharge Exam: Blood pressure (!) 142/64, pulse (!) 58, temperature 97.8 F (36.6 C), temperature source Oral, resp. rate 18, height 5\' 1"  (1.549 m), weight 98.9 kg, SpO2 98%. General: WDWN patient in NAD. Psych:  Appropriate mood and affect. Neuro:  A&O x 3, Moving all  extremities, sensation intact to light touch HEENT:  EOMs intact Chest:  Even non-labored respirations Skin:  Aquacel dressing C/D/I, no rashes or lesions Extremities: warm/dry, no edema, erythema or echymosis.  No lymphadenopathy. Pulses: Popliteus 2+ MSK:  ROM: lacks 5 degrees TKE, MMT: able to perform quad set, (-) Homan's   Disposition: Discharge disposition: 06-Home-Health Care Svc       Discharge Instructions     [facility] pharmacist to adjust dose for Vancomycin , Aminoglycosides and other anti-infective therapies as requested by physician.   Complete by: As directed    [facility] to provide Cath Flo 2mg    Complete by: As directed    Administer for PICC line occlusion and as ordered by physician for other access device issues.   Anaphylaxis Kit: Provided to treat any anaphylactic reaction to the medication being provided to the patient if First Dose or when requested by physician   Complete by: As directed    Epinephrine  1mg /ml vial / amp: Administer 0.3mg  (0.43ml) subcutaneously once for moderate to severe anaphylaxis, nurse to call physician and pharmacy when reaction occurs and call 911 if needed for immediate care   Diphenhydramine  50mg /ml IV vial: Administer 25-50mg  IV/IM PRN for first dose reaction, rash, itching, mild reaction, nurse to call physician and pharmacy when reaction occurs   Sodium Chloride  0.9% NS 500ml IV: Administer if needed for hypovolemic blood pressure drop or as ordered by physician after call to physician with anaphylactic reaction   Call MD / Call 911   Complete by: As directed    If you experience chest pain or shortness of breath, CALL 911 and be transported to the hospital emergency room.  If you develope a fever above 101 F, pus (white drainage) or increased drainage or redness at the  wound, or calf pain, call your surgeon's office.   Call MD / Call 911   Complete by: As directed    If you experience chest pain or  shortness of breath, CALL 911 and be transported to the hospital emergency room.  If you develope a fever above 101 F, pus (white drainage) or increased drainage or redness at the wound, or calf pain, call your surgeon's office.   Change dressing   Complete by: As directed    You may remove the bulky bandage (ACE wrap and gauze) two days after surgery. You will have an adhesive waterproof bandage underneath. Leave this in place until your first follow-up appointment.   Change dressing on IV access line weekly and PRN   Complete by: As directed    Constipation Prevention   Complete by: As directed    Drink plenty of fluids.  Prune juice may be helpful.  You may use a stool softener, such as Colace (over the counter) 100 mg twice a day.  Use MiraLax  (over the counter) for constipation as needed.   Constipation Prevention   Complete by: As directed    Drink plenty of fluids.  Prune juice may be helpful.  You may use a stool softener, such as Colace (over the counter) 100 mg twice a day.  Use MiraLax  (over the counter) for constipation as needed.   Diet - low sodium heart healthy   Complete by: As directed    Diet - low sodium heart healthy   Complete by: As directed    Do not put a pillow under the knee. Place it under the heel.   Complete by: As directed    Driving restrictions   Complete by: As directed    No driving for two weeks   Flush IV access with Sodium Chloride  0.9% and Heparin  10 units/ml or 100 units/ml   Complete by: As directed    Home infusion instructions - [facility]   Complete by: As directed    Instructions: Flush IV access with Sodium Chloride  0.9% and Heparin  10units/ml or 100units/ml   Change dressing on IV access line: Weekly and PRN   Instructions Cath Flo 2mg : Administer for PICC Line occlusion and as ordered by physician for other access device   [facility] pharmacist to adjust dose for: Vancomycin , Aminoglycosides and other anti-infective  therapies as requested by physician   Increase activity slowly as tolerated   Complete by: As directed    Method of administration may be changed at the discretion of home infusion pharmacist based upon assessment of the patient and/or caregiver's ability to self-administer the medication ordered   Complete by: As directed    Post-operative opioid taper instructions:   Complete by: As directed    POST-OPERATIVE OPIOID TAPER INSTRUCTIONS: It is important to wean off of your opioid medication as soon as possible. If you do not need pain medication after your surgery it is ok to stop day one. Opioids include: Codeine, Hydrocodone (Norco, Vicodin), Oxycodone (Percocet, oxycontin ) and hydromorphone  amongst others.  Long term and even short term use of opiods can cause: Increased pain response Dependence Constipation Depression Respiratory depression And more.  Withdrawal symptoms can include Flu like symptoms Nausea, vomiting And more Techniques to manage these symptoms Hydrate well Eat regular healthy meals Stay active Use relaxation techniques(deep breathing, meditating, yoga) Do Not substitute Alcohol  to help with tapering If you have been on opioids for less than two weeks and do not have pain than  it is ok to stop all together.  Plan to wean off of opioids This plan should start within one week post op of your joint replacement. Maintain the same interval or time between taking each dose and first decrease the dose.  Cut the total daily intake of opioids by one tablet each day Next start to increase the time between doses. The last dose that should be eliminated is the evening dose.      Post-operative opioid taper instructions:   Complete by: As directed    POST-OPERATIVE OPIOID TAPER INSTRUCTIONS: It is important to wean off of your opioid medication as soon as possible. If you do not need pain medication after your surgery it is ok to stop day one. Opioids include: Codeine,  Hydrocodone (Norco, Vicodin), Oxycodone (Percocet, oxycontin ) and hydromorphone  amongst others.  Long term and even short term use of opiods can cause: Increased pain response Dependence Constipation Depression Respiratory depression And more.  Withdrawal symptoms can include Flu like symptoms Nausea, vomiting And more Techniques to manage these symptoms Hydrate well Eat regular healthy meals Stay active Use relaxation techniques(deep breathing, meditating, yoga) Do Not substitute Alcohol  to help with tapering If you have been on opioids for less than two weeks and do not have pain than it is ok to stop all together.  Plan to wean off of opioids This plan should start within one week post op of your joint replacement. Maintain the same interval or time between taking each dose and first decrease the dose.  Cut the total daily intake of opioids by one tablet each day Next start to increase the time between doses. The last dose that should be eliminated is the evening dose.      TED hose   Complete by: As directed    Use stockings (TED hose) for three weeks on both leg(s).  You may remove them at night for sleeping.   Weight bearing as tolerated   Complete by: As directed    Weight bearing as tolerated   Complete by: As directed    With walker.      Allergies as of 12/15/2023       Reactions   Sulfa Antibiotics Hives, Itching   Flushing   Cymbalta [duloxetine Hcl] Other (See Comments)   Manic reaction   Demerol Hives, Other (See Comments)   Fever    Ivp Dye [iodinated Contrast Media] Hives, Other (See Comments)   Fever   Morphine  And Codeine Other (See Comments)   ineffective   Sulfasalazine Hives, Other (See Comments)   Flushing   Adhesive [tape] Rash        Medication List     STOP taking these medications    aspirin  EC 81 MG tablet Replaced by: aspirin  81 MG chewable tablet   ibuprofen  800 MG tablet Commonly known as: ADVIL        TAKE these  medications    amiodarone  200 MG tablet Commonly known as: PACERONE  TAKE 1 TABLET (200 MG TOTAL) BY MOUTH DAILY.   ampicillin  IVPB Inject 12 g into the vein daily. Indication:  E faecalis PJI First Dose: Yes Last Day of Therapy:  01/24/24 Labs - Once weekly:  CBC/D and BMP, Labs - Once weekly: ESR and CRP Method of administration: Ambulatory Pump (Continuous Infusion) Method of administration may be changed at the discretion of home infusion pharmacist based upon assessment of the patient and/or caregiver's ability to self-administer the medication ordered.   aspirin  81 MG chewable tablet Chew 1 tablet (  81 mg total) by mouth 2 (two) times daily for 19 days. Then resume an 81 mg aspirin  once a day. Replaces: aspirin  EC 81 MG tablet   cetirizine  10 MG tablet Commonly known as: ZYRTEC  TAKE 1 TABLET (10 MG TOTAL) BY MOUTH DAILY.   colestipol  1 g tablet Commonly known as: COLESTID  Take 2 g by mouth daily as needed (lowering cholesterol).   cycloSPORINE  0.05 % ophthalmic emulsion Commonly known as: RESTASIS  Place 2 drops into both eyes 2 (two) times daily as needed (dry eyes).   diphenoxylate-atropine  2.5-0.025 MG tablet Commonly known as: LOMOTIL Take 1 tablet by mouth 4 (four) times daily as needed for diarrhea or loose stools.   doxylamine  (Sleep) 25 MG tablet Commonly known as: UNISOM  Take 1 tablet (25 mg total) by mouth at bedtime as needed.   fenofibrate  48 MG tablet Commonly known as: Tricor  Take 1 tablet (48 mg total) by mouth daily.   fluticasone  50 MCG/ACT nasal spray Commonly known as: FLONASE  Place 1 spray into both nostrils daily.   folic acid  1 MG tablet Commonly known as: FOLVITE  Take 1 tablet (1 mg total) by mouth daily.   gabapentin  300 MG capsule Commonly known as: NEURONTIN  TAKE 2 CAPSULES BY MOUTH 2 TIMES DAILY. What changed: See the new instructions.   hydroxychloroquine  200 MG tablet Commonly known as: PLAQUENIL  TAKE 1 TABLET (200 MG TOTAL) BY  MOUTH DAILY.   hyoscyamine  0.125 MG Tbdp disintergrating tablet Commonly known as: ANASPAZ  Place 0.25 mg under the tongue every 6 (six) hours as needed for cramping.   LORazepam  0.5 MG tablet Commonly known as: ATIVAN  At bedtime prn   methocarbamol  500 MG tablet Commonly known as: ROBAXIN  Take 1 tablet (500 mg total) by mouth every 6 (six) hours as needed for muscle spasms.   metoprolol  succinate 25 MG 24 hr tablet Commonly known as: TOPROL -XL TAKE 1/2 TABLET BY MOUTH (12.5 MG TOTAL) DAILY   multivitamin with minerals Tabs tablet Take 1 tablet by mouth daily.   NONFORMULARY OR COMPOUNDED ITEM Apply 1 application  topically daily as needed (irritation). Triamcinolone  0.1% & Silvadene cream 1:1- Apply as directed to affected areas as needed   nystatin  cream Commonly known as: MYCOSTATIN  Apply 1 Application topically daily. What changed: Another medication with the same name was changed. Make sure you understand how and when to take each.   nystatin  powder Commonly known as: MYCOSTATIN /NYSTOP  Apply 1 Application topically 3 (three) times daily. What changed:  when to take this reasons to take this   omeprazole 40 MG capsule Commonly known as: PRILOSEC Take 40 mg by mouth daily as needed (acid reflux).   ondansetron  4 MG tablet Commonly known as: ZOFRAN  Take 1 tablet (4 mg total) by mouth every 6 (six) hours as needed for nausea.   oxyCODONE  5 MG immediate release tablet Commonly known as: Oxy IR/ROXICODONE  Take 1-2 tablets (5-10 mg total) by mouth every 6 (six) hours as needed for severe pain (pain score 7-10).   potassium chloride  10 MEQ tablet Commonly known as: KLOR-CON  Take 2 tablets (20 mEq total) by mouth daily.   promethazine  25 MG tablet Commonly known as: PHENERGAN  TAKE 1 TABLET BY MOUTH EVERY 6 HOURS AS NEEDED.   rosuvastatin  10 MG tablet Commonly known as: CRESTOR  Take 1 tablet (10 mg total) by mouth daily. Please keep scheduled appointment for future  refills. Thank you.   spironolactone  25 MG tablet Commonly known as: ALDACTONE  TAKE 1 TABLET (25 MG TOTAL) BY MOUTH DAILY. What changed:  how much to take   tirzepatide 2.5 MG/0.5ML injection vial Commonly known as: ZEPBOUND Inject 2.5 mg into the skin once a week.   Torsemide  40 MG Tabs Take 40 mg by mouth daily.   traMADol  50 MG tablet Commonly known as: ULTRAM  Take 1-2 tablets (50-100 mg total) by mouth every 6 (six) hours as needed for moderate pain (pain score 4-6).               Discharge Care Instructions  (From admission, onward)           Start     Ordered   12/15/23 0000  Weight bearing as tolerated       Comments: With walker.   12/15/23 0829   12/14/23 0000  Weight bearing as tolerated        12/14/23 0849   12/14/23 0000  Change dressing       Comments: You may remove the bulky bandage (ACE wrap and gauze) two days after surgery. You will have an adhesive waterproof bandage underneath. Leave this in place until your first follow-up appointment.   12/14/23 0849   12/13/23 0000  Change dressing on IV access line weekly and PRN  (Home infusion instructions - [facility] )        12/13/23 1513            Follow-up Information     Liliane Rei, MD. Schedule an appointment as soon as possible for a visit in 2 week(s).   Specialty: Orthopedic Surgery Contact information: [address] [place] [address] [place] [address] [phone]                 Signed: Jaci Martini Office:  [phone]

## 2023-12-15 NOTE — Progress Notes (Signed)
 Patient has been c/o itching today. some redness to left middle arm. upper back, cheeks and eyelids bilat. Infectious disease MD notified. Will be in to see patient.

## 2023-12-17 DIAGNOSIS — Z7689 Persons encountering health services in other specified circumstances: Secondary | ICD-10-CM | POA: Diagnosis not present

## 2023-12-17 LAB — AEROBIC/ANAEROBIC CULTURE W GRAM STAIN (SURGICAL/DEEP WOUND)

## 2023-12-19 DIAGNOSIS — T8453XA Infection and inflammatory reaction due to internal right knee prosthesis, initial encounter: Secondary | ICD-10-CM | POA: Diagnosis not present

## 2023-12-20 DIAGNOSIS — T8453XA Infection and inflammatory reaction due to internal right knee prosthesis, initial encounter: Secondary | ICD-10-CM | POA: Diagnosis not present

## 2023-12-21 DIAGNOSIS — T8453XA Infection and inflammatory reaction due to internal right knee prosthesis, initial encounter: Secondary | ICD-10-CM | POA: Diagnosis not present

## 2023-12-24 DIAGNOSIS — T8453XA Infection and inflammatory reaction due to internal right knee prosthesis, initial encounter: Secondary | ICD-10-CM | POA: Diagnosis not present

## 2023-12-25 DIAGNOSIS — W19XXXA Unspecified fall, initial encounter: Secondary | ICD-10-CM | POA: Diagnosis not present

## 2023-12-25 DIAGNOSIS — M25561 Pain in right knee: Secondary | ICD-10-CM | POA: Diagnosis not present

## 2023-12-25 DIAGNOSIS — S80919A Unspecified superficial injury of unspecified knee, initial encounter: Secondary | ICD-10-CM | POA: Diagnosis not present

## 2023-12-26 ENCOUNTER — Encounter (HOSPITAL_COMMUNITY): Payer: Self-pay

## 2023-12-26 ENCOUNTER — Other Ambulatory Visit: Payer: Self-pay

## 2023-12-26 ENCOUNTER — Inpatient Hospital Stay (HOSPITAL_COMMUNITY)

## 2023-12-26 ENCOUNTER — Inpatient Hospital Stay (HOSPITAL_COMMUNITY)
Admission: EM | Admit: 2023-12-26 | Discharge: 2024-01-02 | DRG: 493 | Disposition: A | Discharge status: Skilled Nursing Facility | Attending: Family Medicine | Admitting: Family Medicine

## 2023-12-26 ENCOUNTER — Emergency Department (HOSPITAL_COMMUNITY)

## 2023-12-26 DIAGNOSIS — S8251XA Displaced fracture of medial malleolus of right tibia, initial encounter for closed fracture: Secondary | ICD-10-CM | POA: Diagnosis not present

## 2023-12-26 DIAGNOSIS — Z7982 Long term (current) use of aspirin: Secondary | ICD-10-CM | POA: Diagnosis not present

## 2023-12-26 DIAGNOSIS — I959 Hypotension, unspecified: Secondary | ICD-10-CM | POA: Diagnosis not present

## 2023-12-26 DIAGNOSIS — K589 Irritable bowel syndrome without diarrhea: Secondary | ICD-10-CM | POA: Diagnosis not present

## 2023-12-26 DIAGNOSIS — S82851A Displaced trimalleolar fracture of right lower leg, initial encounter for closed fracture: Secondary | ICD-10-CM | POA: Diagnosis not present

## 2023-12-26 DIAGNOSIS — Z7985 Long-term (current) use of injectable non-insulin antidiabetic drugs: Secondary | ICD-10-CM

## 2023-12-26 DIAGNOSIS — Z043 Encounter for examination and observation following other accident: Secondary | ICD-10-CM | POA: Diagnosis not present

## 2023-12-26 DIAGNOSIS — E7849 Other hyperlipidemia: Secondary | ICD-10-CM | POA: Diagnosis not present

## 2023-12-26 DIAGNOSIS — K219 Gastro-esophageal reflux disease without esophagitis: Secondary | ICD-10-CM | POA: Diagnosis not present

## 2023-12-26 DIAGNOSIS — S82843A Displaced bimalleolar fracture of unspecified lower leg, initial encounter for closed fracture: Secondary | ICD-10-CM | POA: Diagnosis present

## 2023-12-26 DIAGNOSIS — I2510 Atherosclerotic heart disease of native coronary artery without angina pectoris: Secondary | ICD-10-CM | POA: Diagnosis not present

## 2023-12-26 DIAGNOSIS — T8453XA Infection and inflammatory reaction due to internal right knee prosthesis, initial encounter: Secondary | ICD-10-CM | POA: Diagnosis present

## 2023-12-26 DIAGNOSIS — I110 Hypertensive heart disease with heart failure: Secondary | ICD-10-CM | POA: Diagnosis not present

## 2023-12-26 DIAGNOSIS — E876 Hypokalemia: Secondary | ICD-10-CM

## 2023-12-26 DIAGNOSIS — E119 Type 2 diabetes mellitus without complications: Secondary | ICD-10-CM | POA: Diagnosis not present

## 2023-12-26 DIAGNOSIS — N179 Acute kidney failure, unspecified: Secondary | ICD-10-CM | POA: Diagnosis not present

## 2023-12-26 DIAGNOSIS — F418 Other specified anxiety disorders: Secondary | ICD-10-CM

## 2023-12-26 DIAGNOSIS — S0990XA Unspecified injury of head, initial encounter: Secondary | ICD-10-CM | POA: Diagnosis not present

## 2023-12-26 DIAGNOSIS — Z8719 Personal history of other diseases of the digestive system: Secondary | ICD-10-CM | POA: Diagnosis not present

## 2023-12-26 DIAGNOSIS — M3500 Sicca syndrome, unspecified: Secondary | ICD-10-CM | POA: Diagnosis present

## 2023-12-26 DIAGNOSIS — Z882 Allergy status to sulfonamides status: Secondary | ICD-10-CM

## 2023-12-26 DIAGNOSIS — J8410 Pulmonary fibrosis, unspecified: Secondary | ICD-10-CM | POA: Diagnosis not present

## 2023-12-26 DIAGNOSIS — I5032 Chronic diastolic (congestive) heart failure: Secondary | ICD-10-CM

## 2023-12-26 DIAGNOSIS — S82853A Displaced trimalleolar fracture of unspecified lower leg, initial encounter for closed fracture: Secondary | ICD-10-CM

## 2023-12-26 DIAGNOSIS — Z7401 Bed confinement status: Secondary | ICD-10-CM | POA: Diagnosis not present

## 2023-12-26 DIAGNOSIS — D500 Iron deficiency anemia secondary to blood loss (chronic): Secondary | ICD-10-CM | POA: Diagnosis not present

## 2023-12-26 DIAGNOSIS — E7800 Pure hypercholesterolemia, unspecified: Secondary | ICD-10-CM | POA: Diagnosis not present

## 2023-12-26 DIAGNOSIS — T8453XD Infection and inflammatory reaction due to internal right knee prosthesis, subsequent encounter: Principal | ICD-10-CM

## 2023-12-26 DIAGNOSIS — Z8739 Personal history of other diseases of the musculoskeletal system and connective tissue: Secondary | ICD-10-CM | POA: Diagnosis not present

## 2023-12-26 DIAGNOSIS — M069 Rheumatoid arthritis, unspecified: Secondary | ICD-10-CM | POA: Diagnosis present

## 2023-12-26 DIAGNOSIS — Z9049 Acquired absence of other specified parts of digestive tract: Secondary | ICD-10-CM

## 2023-12-26 DIAGNOSIS — K200 Eosinophilic esophagitis: Secondary | ICD-10-CM | POA: Diagnosis not present

## 2023-12-26 DIAGNOSIS — N1832 Chronic kidney disease, stage 3b: Secondary | ICD-10-CM | POA: Diagnosis not present

## 2023-12-26 DIAGNOSIS — M810 Age-related osteoporosis without current pathological fracture: Secondary | ICD-10-CM | POA: Diagnosis not present

## 2023-12-26 DIAGNOSIS — I7300 Raynaud's syndrome without gangrene: Secondary | ICD-10-CM | POA: Diagnosis present

## 2023-12-26 DIAGNOSIS — Z90710 Acquired absence of both cervix and uterus: Secondary | ICD-10-CM

## 2023-12-26 DIAGNOSIS — M25561 Pain in right knee: Secondary | ICD-10-CM | POA: Diagnosis not present

## 2023-12-26 DIAGNOSIS — S82431A Displaced oblique fracture of shaft of right fibula, initial encounter for closed fracture: Secondary | ICD-10-CM | POA: Diagnosis not present

## 2023-12-26 DIAGNOSIS — S82891A Other fracture of right lower leg, initial encounter for closed fracture: Secondary | ICD-10-CM | POA: Diagnosis not present

## 2023-12-26 DIAGNOSIS — Z91041 Radiographic dye allergy status: Secondary | ICD-10-CM

## 2023-12-26 DIAGNOSIS — Z66 Do not resuscitate: Secondary | ICD-10-CM | POA: Diagnosis not present

## 2023-12-26 DIAGNOSIS — W19XXXA Unspecified fall, initial encounter: Secondary | ICD-10-CM | POA: Diagnosis not present

## 2023-12-26 DIAGNOSIS — E785 Hyperlipidemia, unspecified: Secondary | ICD-10-CM | POA: Diagnosis present

## 2023-12-26 DIAGNOSIS — G629 Polyneuropathy, unspecified: Secondary | ICD-10-CM

## 2023-12-26 DIAGNOSIS — R931 Abnormal findings on diagnostic imaging of heart and coronary circulation: Secondary | ICD-10-CM | POA: Diagnosis not present

## 2023-12-26 DIAGNOSIS — M797 Fibromyalgia: Secondary | ICD-10-CM | POA: Diagnosis present

## 2023-12-26 DIAGNOSIS — Z85828 Personal history of other malignant neoplasm of skin: Secondary | ICD-10-CM

## 2023-12-26 DIAGNOSIS — E559 Vitamin D deficiency, unspecified: Secondary | ICD-10-CM | POA: Diagnosis not present

## 2023-12-26 DIAGNOSIS — W1830XA Fall on same level, unspecified, initial encounter: Secondary | ICD-10-CM | POA: Diagnosis present

## 2023-12-26 DIAGNOSIS — S82401A Unspecified fracture of shaft of right fibula, initial encounter for closed fracture: Secondary | ICD-10-CM | POA: Diagnosis not present

## 2023-12-26 DIAGNOSIS — G4733 Obstructive sleep apnea (adult) (pediatric): Secondary | ICD-10-CM | POA: Diagnosis not present

## 2023-12-26 DIAGNOSIS — S82851D Displaced trimalleolar fracture of right lower leg, subsequent encounter for closed fracture with routine healing: Secondary | ICD-10-CM | POA: Diagnosis not present

## 2023-12-26 DIAGNOSIS — Y831 Surgical operation with implant of artificial internal device as the cause of abnormal reaction of the patient, or of later complication, without mention of misadventure at the time of the procedure: Secondary | ICD-10-CM | POA: Diagnosis present

## 2023-12-26 DIAGNOSIS — Z885 Allergy status to narcotic agent status: Secondary | ICD-10-CM

## 2023-12-26 DIAGNOSIS — R5383 Other fatigue: Secondary | ICD-10-CM | POA: Diagnosis not present

## 2023-12-26 DIAGNOSIS — E66813 Obesity, class 3: Secondary | ICD-10-CM | POA: Diagnosis not present

## 2023-12-26 DIAGNOSIS — I10 Essential (primary) hypertension: Secondary | ICD-10-CM | POA: Diagnosis not present

## 2023-12-26 DIAGNOSIS — Z79899 Other long term (current) drug therapy: Secondary | ICD-10-CM

## 2023-12-26 DIAGNOSIS — M419 Scoliosis, unspecified: Secondary | ICD-10-CM | POA: Diagnosis not present

## 2023-12-26 DIAGNOSIS — D62 Acute posthemorrhagic anemia: Secondary | ICD-10-CM | POA: Diagnosis not present

## 2023-12-26 DIAGNOSIS — S82841A Displaced bimalleolar fracture of right lower leg, initial encounter for closed fracture: Secondary | ICD-10-CM | POA: Diagnosis not present

## 2023-12-26 DIAGNOSIS — I493 Ventricular premature depolarization: Secondary | ICD-10-CM

## 2023-12-26 DIAGNOSIS — M6281 Muscle weakness (generalized): Secondary | ICD-10-CM | POA: Diagnosis not present

## 2023-12-26 DIAGNOSIS — T8453XS Infection and inflammatory reaction due to internal right knee prosthesis, sequela: Secondary | ICD-10-CM | POA: Diagnosis not present

## 2023-12-26 DIAGNOSIS — I130 Hypertensive heart and chronic kidney disease with heart failure and stage 1 through stage 4 chronic kidney disease, or unspecified chronic kidney disease: Secondary | ICD-10-CM | POA: Diagnosis present

## 2023-12-26 DIAGNOSIS — E1122 Type 2 diabetes mellitus with diabetic chronic kidney disease: Secondary | ICD-10-CM | POA: Diagnosis not present

## 2023-12-26 DIAGNOSIS — M0579 Rheumatoid arthritis with rheumatoid factor of multiple sites without organ or systems involvement: Secondary | ICD-10-CM | POA: Diagnosis not present

## 2023-12-26 DIAGNOSIS — Z6841 Body Mass Index (BMI) 40.0 and over, adult: Secondary | ICD-10-CM | POA: Diagnosis not present

## 2023-12-26 DIAGNOSIS — D649 Anemia, unspecified: Secondary | ICD-10-CM | POA: Diagnosis not present

## 2023-12-26 DIAGNOSIS — M7731 Calcaneal spur, right foot: Secondary | ICD-10-CM | POA: Diagnosis not present

## 2023-12-26 DIAGNOSIS — M545 Low back pain, unspecified: Secondary | ICD-10-CM | POA: Diagnosis not present

## 2023-12-26 DIAGNOSIS — D509 Iron deficiency anemia, unspecified: Secondary | ICD-10-CM | POA: Diagnosis present

## 2023-12-26 DIAGNOSIS — R278 Other lack of coordination: Secondary | ICD-10-CM | POA: Diagnosis not present

## 2023-12-26 DIAGNOSIS — Y92009 Unspecified place in unspecified non-institutional (private) residence as the place of occurrence of the external cause: Secondary | ICD-10-CM

## 2023-12-26 DIAGNOSIS — Z888 Allergy status to other drugs, medicaments and biological substances status: Secondary | ICD-10-CM

## 2023-12-26 DIAGNOSIS — Z9889 Other specified postprocedural states: Secondary | ICD-10-CM | POA: Diagnosis not present

## 2023-12-26 DIAGNOSIS — Z96651 Presence of right artificial knee joint: Secondary | ICD-10-CM | POA: Diagnosis not present

## 2023-12-26 DIAGNOSIS — R2689 Other abnormalities of gait and mobility: Secondary | ICD-10-CM | POA: Diagnosis not present

## 2023-12-26 DIAGNOSIS — E1140 Type 2 diabetes mellitus with diabetic neuropathy, unspecified: Secondary | ICD-10-CM | POA: Diagnosis not present

## 2023-12-26 DIAGNOSIS — N189 Chronic kidney disease, unspecified: Secondary | ICD-10-CM

## 2023-12-26 DIAGNOSIS — S82451A Displaced comminuted fracture of shaft of right fibula, initial encounter for closed fracture: Secondary | ICD-10-CM | POA: Diagnosis not present

## 2023-12-26 DIAGNOSIS — G8918 Other acute postprocedural pain: Secondary | ICD-10-CM | POA: Diagnosis not present

## 2023-12-26 DIAGNOSIS — R600 Localized edema: Secondary | ICD-10-CM | POA: Diagnosis not present

## 2023-12-26 DIAGNOSIS — Z471 Aftercare following joint replacement surgery: Secondary | ICD-10-CM | POA: Diagnosis not present

## 2023-12-26 DIAGNOSIS — J329 Chronic sinusitis, unspecified: Secondary | ICD-10-CM | POA: Diagnosis not present

## 2023-12-26 DIAGNOSIS — Z8249 Family history of ischemic heart disease and other diseases of the circulatory system: Secondary | ICD-10-CM

## 2023-12-26 DIAGNOSIS — S93431A Sprain of tibiofibular ligament of right ankle, initial encounter: Secondary | ICD-10-CM | POA: Diagnosis not present

## 2023-12-26 DIAGNOSIS — M19071 Primary osteoarthritis, right ankle and foot: Secondary | ICD-10-CM | POA: Diagnosis not present

## 2023-12-26 LAB — CBC WITH DIFFERENTIAL/PLATELET
Abs Immature Granulocytes: 0.12 10*3/uL — ABNORMAL HIGH (ref 0.00–0.07)
Basophils Absolute: 0.1 10*3/uL (ref 0.0–0.1)
Basophils Relative: 0 %
Eosinophils Absolute: 0.1 10*3/uL (ref 0.0–0.5)
Eosinophils Relative: 1 %
HCT: 36.4 % (ref 36.0–46.0)
Hemoglobin: 11 g/dL — ABNORMAL LOW (ref 12.0–15.0)
Immature Granulocytes: 1 %
Lymphocytes Relative: 5 %
Lymphs Abs: 0.9 10*3/uL (ref 0.7–4.0)
MCH: 27.5 pg (ref 26.0–34.0)
MCHC: 30.2 g/dL (ref 30.0–36.0)
MCV: 91 fL (ref 80.0–100.0)
Monocytes Absolute: 0.9 10*3/uL (ref 0.1–1.0)
Monocytes Relative: 6 %
Neutro Abs: 14.6 10*3/uL — ABNORMAL HIGH (ref 1.7–7.7)
Neutrophils Relative %: 87 %
Platelets: 277 10*3/uL (ref 150–400)
RBC: 4 MIL/uL (ref 3.87–5.11)
RDW: 14.7 % (ref 11.5–15.5)
WBC: 16.6 10*3/uL — ABNORMAL HIGH (ref 4.0–10.5)
nRBC: 0 % (ref 0.0–0.2)

## 2023-12-26 LAB — SEDIMENTATION RATE: Sed Rate: 67 mm/h — ABNORMAL HIGH (ref 0–22)

## 2023-12-26 LAB — BASIC METABOLIC PANEL WITH GFR
Anion gap: 12 (ref 5–15)
BUN: 21 mg/dL (ref 8–23)
CO2: 22 mmol/L (ref 22–32)
Calcium: 8.8 mg/dL — ABNORMAL LOW (ref 8.9–10.3)
Chloride: 103 mmol/L (ref 98–111)
Creatinine, Ser: 1.37 mg/dL — ABNORMAL HIGH (ref 0.44–1.00)
GFR, Estimated: 41 mL/min — ABNORMAL LOW (ref >60)
Glucose, Bld: 124 mg/dL — ABNORMAL HIGH (ref 70–99)
Potassium: 3.2 mmol/L — ABNORMAL LOW (ref 3.5–5.1)
Sodium: 137 mmol/L (ref 135–145)

## 2023-12-26 LAB — C-REACTIVE PROTEIN: CRP: 4.1 mg/dL — ABNORMAL HIGH (ref <1.0)

## 2023-12-26 MED ORDER — ONDANSETRON HCL 4 MG PO TABS
4.0000 mg | ORAL_TABLET | Freq: Four times a day (QID) | ORAL | Status: DC | PRN
Start: 2023-12-26 — End: 2024-01-03

## 2023-12-26 MED ORDER — SODIUM CHLORIDE 0.9 % IV SOLN
2.0000 g | Freq: Four times a day (QID) | INTRAVENOUS | Status: DC
  Administered 2023-12-26 – 2023-12-27 (×4): 2 g via INTRAVENOUS
  Filled 2023-12-26 (×6): qty 2000

## 2023-12-26 MED ORDER — ONDANSETRON HCL 4 MG/2ML IJ SOLN
4.0000 mg | Freq: Once | INTRAMUSCULAR | Status: AC
  Administered 2023-12-26: 4 mg via INTRAVENOUS
  Filled 2023-12-26: qty 2

## 2023-12-26 MED ORDER — METHOCARBAMOL 500 MG PO TABS
500.0000 mg | ORAL_TABLET | Freq: Once | ORAL | Status: AC
  Administered 2023-12-26: 500 mg via ORAL
  Filled 2023-12-26: qty 1

## 2023-12-26 MED ORDER — POTASSIUM CHLORIDE CRYS ER 10 MEQ PO TBCR
20.0000 meq | EXTENDED_RELEASE_TABLET | Freq: Every day | ORAL | Status: DC
  Administered 2023-12-27 – 2024-01-02 (×6): 20 meq via ORAL
  Filled 2023-12-26 (×6): qty 2

## 2023-12-26 MED ORDER — SODIUM CHLORIDE 0.9% FLUSH: 3.0000 mL | INTRAVENOUS | Status: DC | PRN

## 2023-12-26 MED ORDER — DIPHENOXYLATE-ATROPINE 2.5-0.025 MG PO TABS: 1.0000 | ORAL_TABLET | Freq: Four times a day (QID) | ORAL | Status: DC | PRN

## 2023-12-26 MED ORDER — ACETAMINOPHEN 650 MG RE SUPP: 650.0000 mg | Freq: Four times a day (QID) | RECTAL | Status: DC | PRN

## 2023-12-26 MED ORDER — HYDROMORPHONE HCL 1 MG/ML IJ SOLN
1.0000 mg | INTRAMUSCULAR | Status: DC | PRN
  Administered 2023-12-26 – 2024-01-02 (×20): 1 mg via INTRAVENOUS
  Filled 2023-12-26 (×20): qty 1

## 2023-12-26 MED ORDER — SODIUM CHLORIDE 0.9 % IV SOLN
2.0000 g | INTRAVENOUS | Status: DC
  Filled 2023-12-26 (×2): qty 2000

## 2023-12-26 MED ORDER — FENOFIBRATE 54 MG PO TABS
54.0000 mg | ORAL_TABLET | Freq: Every day | ORAL | Status: DC
  Administered 2023-12-27 – 2024-01-02 (×6): 54 mg via ORAL
  Filled 2023-12-26 (×7): qty 1

## 2023-12-26 MED ORDER — LACTATED RINGERS IV SOLN
INTRAVENOUS | Status: AC
Start: 2023-12-26 — End: 2023-12-27

## 2023-12-26 MED ORDER — HYDROMORPHONE HCL 1 MG/ML IJ SOLN
1.0000 mg | Freq: Once | INTRAMUSCULAR | Status: AC
  Administered 2023-12-26: 1 mg via INTRAVENOUS
  Filled 2023-12-26: qty 1

## 2023-12-26 MED ORDER — SODIUM CHLORIDE 0.9% FLUSH
3.0000 mL | Freq: Two times a day (BID) | INTRAVENOUS | Status: DC
  Administered 2023-12-26 – 2024-01-02 (×8): 3 mL via INTRAVENOUS

## 2023-12-26 MED ORDER — METOPROLOL SUCCINATE ER 25 MG PO TB24
12.5000 mg | ORAL_TABLET | Freq: Every day | ORAL | Status: DC
  Administered 2023-12-26 – 2024-01-02 (×7): 12.5 mg via ORAL
  Filled 2023-12-26 (×7): qty 1

## 2023-12-26 MED ORDER — OXYCODONE HCL 5 MG PO TABS
5.0000 mg | ORAL_TABLET | Freq: Four times a day (QID) | ORAL | Status: DC | PRN
  Administered 2023-12-26: 10 mg via ORAL
  Filled 2023-12-26 (×3): qty 2

## 2023-12-26 MED ORDER — SODIUM CHLORIDE 0.9% FLUSH: 10.0000 mL | INTRAVENOUS | Status: DC | PRN

## 2023-12-26 MED ORDER — CHLORHEXIDINE GLUCONATE CLOTH 2 % EX PADS
6.0000 | MEDICATED_PAD | Freq: Every day | CUTANEOUS | Status: DC
  Administered 2023-12-26 – 2024-01-02 (×8): 6 via TOPICAL

## 2023-12-26 MED ORDER — METOPROLOL SUCCINATE ER 25 MG PO TB24: 12.5000 mg | ORAL_TABLET | Freq: Every day | ORAL | Status: DC

## 2023-12-26 MED ORDER — HYDROXYCHLOROQUINE SULFATE 200 MG PO TABS: 200.0000 mg | ORAL_TABLET | Freq: Every day | ORAL | Status: DC

## 2023-12-26 MED ORDER — SODIUM CHLORIDE 0.9% FLUSH
10.0000 mL | Freq: Two times a day (BID) | INTRAVENOUS | Status: DC
  Administered 2023-12-27 – 2024-01-01 (×3): 10 mL

## 2023-12-26 MED ORDER — POTASSIUM CHLORIDE CRYS ER 20 MEQ PO TBCR: 40.0000 meq | EXTENDED_RELEASE_TABLET | Freq: Once | ORAL | Status: DC

## 2023-12-26 MED ORDER — HYOSCYAMINE SULFATE 0.125 MG SL SUBL: 0.2500 mg | SUBLINGUAL_TABLET | Freq: Four times a day (QID) | SUBLINGUAL | Status: DC | PRN

## 2023-12-26 MED ORDER — SODIUM CHLORIDE 0.9 % IV SOLN: 250.0000 mL | INTRAVENOUS | Status: AC | PRN

## 2023-12-26 MED ORDER — ACETAMINOPHEN 325 MG PO TABS
650.0000 mg | ORAL_TABLET | Freq: Four times a day (QID) | ORAL | Status: DC | PRN
  Administered 2023-12-27: 650 mg via ORAL
  Filled 2023-12-26: qty 2

## 2023-12-26 MED ORDER — METHOCARBAMOL 500 MG PO TABS
500.0000 mg | ORAL_TABLET | Freq: Four times a day (QID) | ORAL | Status: DC | PRN
Start: 2023-12-26 — End: 2024-01-03
  Administered 2023-12-26 – 2024-01-02 (×18): 500 mg via ORAL
  Filled 2023-12-26 (×18): qty 1

## 2023-12-26 MED ORDER — GABAPENTIN 400 MG PO CAPS
600.0000 mg | ORAL_CAPSULE | Freq: Every day | ORAL | Status: DC
  Administered 2023-12-26 – 2024-01-01 (×7): 600 mg via ORAL
  Filled 2023-12-26: qty 6
  Filled 2023-12-26: qty 2
  Filled 2023-12-26 (×2): qty 6
  Filled 2023-12-26 (×3): qty 2

## 2023-12-26 MED ORDER — PANTOPRAZOLE SODIUM 40 MG PO TBEC
40.0000 mg | DELAYED_RELEASE_TABLET | Freq: Every day | ORAL | Status: DC
  Administered 2023-12-27 – 2024-01-02 (×6): 40 mg via ORAL
  Filled 2023-12-26 (×6): qty 1

## 2023-12-26 MED ORDER — SPIRONOLACTONE 12.5 MG HALF TABLET: 12.5000 mg | ORAL_TABLET | Freq: Every day | ORAL | Status: DC

## 2023-12-26 MED ORDER — FOLIC ACID 1 MG PO TABS
1.0000 mg | ORAL_TABLET | Freq: Every day | ORAL | Status: DC
  Administered 2023-12-27 – 2023-12-31 (×4): 1 mg via ORAL
  Filled 2023-12-26 (×4): qty 1

## 2023-12-26 MED ORDER — ONDANSETRON HCL 4 MG/2ML IJ SOLN
4.0000 mg | Freq: Four times a day (QID) | INTRAMUSCULAR | Status: DC | PRN
  Administered 2023-12-29: 4 mg via INTRAVENOUS
  Filled 2023-12-26: qty 2

## 2023-12-26 MED ORDER — AMIODARONE HCL 200 MG PO TABS: 200.0000 mg | ORAL_TABLET | Freq: Every day | ORAL | Status: DC

## 2023-12-26 MED ORDER — ROSUVASTATIN CALCIUM 10 MG PO TABS
10.0000 mg | ORAL_TABLET | Freq: Every day | ORAL | Status: DC
  Administered 2023-12-27 – 2024-01-02 (×6): 10 mg via ORAL
  Filled 2023-12-26 (×6): qty 1

## 2023-12-26 MED ORDER — GABAPENTIN 100 MG PO CAPS
300.0000 mg | ORAL_CAPSULE | Freq: Every day | ORAL | Status: DC
  Administered 2023-12-27 – 2024-01-02 (×6): 300 mg via ORAL
  Filled 2023-12-26 (×6): qty 3

## 2023-12-26 MED ORDER — HEPARIN SODIUM (PORCINE) 5000 UNIT/ML IJ SOLN
5000.0000 [IU] | Freq: Three times a day (TID) | INTRAMUSCULAR | Status: DC
  Administered 2023-12-26 – 2023-12-28 (×7): 5000 [IU] via SUBCUTANEOUS
  Filled 2023-12-26 (×7): qty 1

## 2023-12-26 MED ORDER — SODIUM CHLORIDE 0.9% FLUSH
3.0000 mL | Freq: Two times a day (BID) | INTRAVENOUS | Status: DC
  Administered 2023-12-26 – 2024-01-01 (×4): 3 mL via INTRAVENOUS

## 2023-12-26 MED ORDER — TORSEMIDE 40 MG PO TABS: 40.0000 mg | ORAL_TABLET | Freq: Every day | ORAL | Status: DC

## 2023-12-26 MED ORDER — HYOSCYAMINE SULFATE 0.125 MG PO TBDP: 0.2500 mg | ORAL_TABLET | Freq: Four times a day (QID) | ORAL | Status: DC | PRN

## 2023-12-26 NOTE — ED Provider Notes (Signed)
 [place] EMERGENCY DEPARTMENT AT [facility] Provider Note   CSN: [id number] Arrival date & time: 12/26/23  0050     History  Chief Complaint  Patient presents with   Fall   Knee Pain    [name] is a 73 y.o. female.  The history is provided by the patient.  Fall  Knee Pain [name] is a 73 y.o. female who presents to the Emergency Department complaining of fall.  She presents to the emergency department by EMS for evaluation of injuries following a fall at home.  She was ambulating with a walker when she fell down and struck her right knee.  No head injury or loss of consciousness.  She needed assistance to get up.  She complains of severe pain to the right knee.  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  She is on PICC line antibiotics.  Denies any fevers, nausea, vomiting.  She did take an oxycodone  at 5:00 and has ongoing pain at the site.      Home Medications Prior to Admission medications   Medication Sig Start Date End Date Taking? Authorizing Provider  amiodarone  (PACERONE ) 200 MG tablet TAKE 1 TABLET (200 MG TOTAL) BY MOUTH DAILY. Patient not taking: Reported on 12/07/2023 12/21/22   Tammie Fall, MD  ampicillin  IVPB Inject 12 g into the vein daily. Indication:  E faecalis PJI First Dose: Yes Last Day of Therapy:  01/24/24 Labs - Once weekly:  CBC/D and BMP, Labs - Once weekly: ESR and CRP Method of administration: Ambulatory Pump (Continuous Infusion) Method of administration may be changed at the discretion of home infusion pharmacist based upon assessment of the patient and/or caregiver's ability to self-administer the medication ordered. 12/13/23 01/24/24  Perla Bradford, PA  aspirin  81 MG chewable tablet Chew 1 tablet (81 mg total) by mouth 2 (two) times daily for 19 days. Then resume an 81 mg aspirin  once a day. 12/14/23 01/02/24  Perla Bradford, PA  cetirizine  (ZYRTEC ) 10 MG tablet TAKE 1 TABLET (10 MG TOTAL) BY  MOUTH DAILY. 10/12/23   Noreene Bearded, PA  colestipol  (COLESTID ) 1 g tablet Take 2 g by mouth daily as needed (lowering cholesterol).    [provider]  cycloSPORINE  (RESTASIS ) 0.05 % ophthalmic emulsion Place 2 drops into both eyes 2 (two) times daily as needed (dry eyes).    [provider]  diphenoxylate-atropine  (LOMOTIL) 2.5-0.025 MG per tablet Take 1 tablet by mouth 4 (four) times daily as needed for diarrhea or loose stools.    [provider]  doxylamine , Sleep, (UNISOM ) 25 MG tablet Take 1 tablet (25 mg total) by mouth at bedtime as needed. Patient not taking: Reported on 12/07/2023 09/09/20   Jenean Minus, MD  fenofibrate  (TRICOR ) 48 MG tablet Take 1 tablet (48 mg total) by mouth daily. 08/20/23   Jacqueline Matsu, MD  fluticasone  (FLONASE ) 50 MCG/ACT nasal spray Place 1 spray into both nostrils daily. Patient not taking: Reported on 12/07/2023 06/14/21   Jenean Minus, MD  folic acid  (FOLVITE ) 1 MG tablet Take 1 tablet (1 mg total) by mouth daily. 06/01/23   Pasam, Gale Jude, MD  gabapentin  (NEURONTIN ) 300 MG capsule TAKE 2 CAPSULES BY MOUTH 2 TIMES DAILY. Patient taking differently: Take 300-600 mg by mouth See admin instructions. Take 300 mg by mouth in the morning and 600 mg at night 09/24/23   Millikan, Megan, NP  hydroxychloroquine  (PLAQUENIL ) 200 MG tablet  TAKE 1 TABLET (200 MG TOTAL) BY MOUTH DAILY. 10/12/23   Romayne Clubs, PA-C  hyoscyamine  (ANASPAZ ) 0.125 MG TBDP disintergrating tablet Place 0.25 mg under the tongue every 6 (six) hours as needed for cramping.     [provider]  LORazepam  (ATIVAN ) 0.5 MG tablet At bedtime prn 11/02/23   Laneta Pintos, MD  methocarbamol  (ROBAXIN ) 500 MG tablet Take 1 tablet (500 mg total) by mouth every 6 (six) hours as needed for muscle spasms. 12/14/23   Perla Bradford, PA  metoprolol  succinate (TOPROL -XL) 25 MG 24 hr tablet TAKE 1/2 TABLET BY MOUTH (12.5 MG TOTAL) DAILY 10/12/23   Jacqueline Matsu,  MD  Multiple Vitamin (MULTIVITAMIN WITH MINERALS) TABS tablet Take 1 tablet by mouth daily.    [provider]  NONFORMULARY OR COMPOUNDED ITEM Apply 1 application  topically daily as needed (irritation). Triamcinolone  0.1% & Silvadene cream 1:1- Apply as directed to affected areas as needed    [provider]  nystatin  (MYCOSTATIN /NYSTOP ) powder Apply 1 Application topically 3 (three) times daily. Patient taking differently: Apply 1 Application topically daily as needed (irritation). 12/28/22   Boscia, Heather E, NP  nystatin  cream (MYCOSTATIN ) Apply 1 Application topically daily.    [provider]  omeprazole (PRILOSEC) 40 MG capsule Take 40 mg by mouth daily as needed (acid reflux). 10/05/21   [provider]  ondansetron  (ZOFRAN ) 4 MG tablet Take 1 tablet (4 mg total) by mouth every 6 (six) hours as needed for nausea. 12/14/23   Perla Bradford, PA  oxyCODONE  (OXY IR/ROXICODONE ) 5 MG immediate release tablet Take 1-2 tablets (5-10 mg total) by mouth every 6 (six) hours as needed for severe pain (pain score 7-10). 12/14/23   Perla Bradford, PA  potassium chloride  (KLOR-CON ) 10 MEQ tablet Take 2 tablets (20 mEq total) by mouth daily. 05/04/21   Jenean Minus, MD  promethazine  (PHENERGAN ) 25 MG tablet TAKE 1 TABLET BY MOUTH EVERY 6 HOURS AS NEEDED. 02/08/23   Romayne Clubs, PA-C  rosuvastatin  (CRESTOR ) 10 MG tablet Take 1 tablet (10 mg total) by mouth daily. Please keep scheduled appointment for future refills. Thank you. 09/24/23   Jacqueline Matsu, MD  spironolactone  (ALDACTONE ) 25 MG tablet TAKE 1 TABLET (25 MG TOTAL) BY MOUTH DAILY. Patient taking differently: Take 12.5 mg by mouth daily. 06/08/23   Tammie Fall, MD  tirzepatide [facility]) 2.5 MG/0.5ML injection vial Inject 2.5 mg into the skin once a week. 11/02/23   Laneta Pintos, MD  torsemide  40 MG TABS Take 40 mg by mouth daily. 01/12/21   Bensimhon, Rheta Celestine, MD  traMADol  (ULTRAM ) 50 MG tablet Take  1-2 tablets (50-100 mg total) by mouth every 6 (six) hours as needed for moderate pain (pain score 4-6). 12/14/23   Perla Bradford, PA      Allergies    Sulfa antibiotics, Cymbalta [duloxetine hcl], Demerol, Ivp dye [iodinated contrast media], Morphine  and codeine, Sulfasalazine, and Adhesive [tape]    Review of Systems   Review of Systems  All other systems reviewed and are negative.   Physical Exam Updated Vital Signs BP (!) 145/71 (BP Location: Left Arm)   Pulse 92   Temp 98.2 F (36.8 C) (Oral)   Resp 16   Ht 5' 1.5" (1.562 m)   Wt 97.5 kg   SpO2 99%   BMI 39.97 kg/m  Physical Exam Vitals and nursing note reviewed.  Constitutional:      Appearance: She is well-developed.  HENT:  Head: Normocephalic and atraumatic.  Cardiovascular:     Rate and Rhythm: Normal rate and regular rhythm.  Pulmonary:     Effort: Pulmonary effort is normal. No respiratory distress.  Abdominal:     Palpations: Abdomen is soft.     Tenderness: There is no abdominal tenderness. There is no guarding or rebound.  Musculoskeletal:     Comments: Edema throughout the RLE.  There is a dressing in place to the right anterior knee with underlying blood to mid and inferior aspect.  2+DP pulses bilaterally.  No tenderness over the hips, femurs.  TTP throughout the right knee.  Mild tenderness over the right ankle.    Skin:    General: Skin is warm and dry.  Neurological:     Mental Status: She is alert and oriented to person, place, and time.  Psychiatric:        Behavior: Behavior normal.     ED Results / Procedures / Treatments   Labs (all labs ordered are listed, but only abnormal results are displayed) Labs Reviewed  BASIC METABOLIC PANEL WITH GFR - Abnormal; Notable for the following components:      Result Value   Potassium 3.2 (*)    Glucose, Bld 124 (*)    Creatinine, Ser 1.37 (*)    Calcium  8.8 (*)    GFR, Estimated 41 (*)    All other components within normal limits  CBC WITH  DIFFERENTIAL/PLATELET - Abnormal; Notable for the following components:   WBC 16.6 (*)    Hemoglobin 11.0 (*)    Neutro Abs 14.6 (*)    Abs Immature Granulocytes 0.12 (*)    All other components within normal limits  SEDIMENTATION RATE  C-REACTIVE PROTEIN  URINALYSIS, ROUTINE W REFLEX MICROSCOPIC    EKG None  Radiology DG Ankle 2 Views Right Result Date: 12/26/2023 CLINICAL DATA:  Complex right ankle fracture following closed reduction. EXAM: RIGHT ANKLE - 2 VIEW COMPARISON:  Earlier study right ankle series today at 2:07 a.m. FINDINGS: 5:39 a.m. AP and lateral two views only. There is overlying fiberglass casting material. Alignment is improved. The distal fibular shaft fracture is now only displaced about 1 cortex width anteriorly and laterally and no longer angulated. Prior widening of the medial mortise has been reduced with the medial malleolar fracture fragment better in line with the parent bone. 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 No new fracture has become apparent. There is severe diffuse soft tissue swelling. IMPRESSION: 1. Improved alignment of the distal fibular shaft fracture and medial malleolar fracture fragment following closed reduction. 2. Prior widening of the medial mortise has been reduced. 3. Severe diffuse soft tissue swelling. Electronically Signed   By: Denman Fischer M.D.   On: 12/26/2023 06:01   DG Ankle Complete Right Result Date: 12/26/2023 CLINICAL DATA:  fall EXAM: RIGHT ANKLE - COMPLETE 3+ VIEW COMPARISON:  X-ray right ankle 05/23/2022 FINDINGS: Acute comminuted and displaced medial malleolar and transsyndesmotic distal fibular fractures. No posterior malleolar fracture. No definite talar fracture. Midfoot degenerative changes. Plantar calcaneal spur. No aggressive appearing focal bone abnormality. Subcutaneus soft tissue edema. Vascular calcifications. IMPRESSION: Acute comminuted and  displaced bimalleolar fracture. Electronically Signed   By: Morgane  Naveau M.D.   On: 12/26/2023 02:39   DG Knee Complete 4 Views Right Result Date: 12/26/2023 CLINICAL DATA:  Fall EXAM: RIGHT KNEE - COMPLETE 4+ VIEW COMPARISON:  None Available. FINDINGS: Markedly swollen soft tissues at the right knee  with anterior skin staples. Status post total knee arthroplasty. Screw and plate fixation of the distal femur. No periprosthetic fracture IMPRESSION: Markedly swollen soft tissues at the right knee. No periprosthetic fracture. Electronically Signed   By: Juanetta Nordmann M.D.   On: 12/26/2023 02:34    Procedures .Ortho Injury Treatment  Date/Time: 12/26/2023 5:54 AM  Performed by: Kelsey Patricia, MD Authorized by: Kelsey Patricia, MD   Consent:    Consent obtained:  Verbal   Consent given by:  Patient   Risks discussed:  Fracture   Alternatives discussed:  ImmobilizationInjury location: ankle Location details: right ankle Injury type: fracture Fracture type: bimalleolar Pre-procedure neurovascular assessment: neurovascularly intact  Patient sedated: NoImmobilization: splint Splint type: short leg Splint Applied by: Ortho Tech Supplies used: elastic bandage and Ortho-Glass Post-procedure distal perfusion: normal Post-procedure neurological function: normal       Medications Ordered in ED Medications  oxyCODONE  (Oxy IR/ROXICODONE ) immediate release tablet 5-10 mg (has no administration in time range)  hydroxychloroquine  (PLAQUENIL ) tablet 200 mg (has no administration in time range)  fenofibrate  tablet 54 mg (has no administration in time range)  amiodarone  (PACERONE ) tablet 200 mg (has no administration in time range)  metoprolol  succinate (TOPROL -XL) 24 hr tablet 12.5 mg (has no administration in time range)  rosuvastatin  (CRESTOR ) tablet 10 mg (has no administration in time range)  hyoscyamine  (ANASPAZ ) disintergrating tablet 0.25 mg (has no administration in time range)   pantoprazole  (PROTONIX ) EC tablet 40 mg (has no administration in time range)  folic acid  (FOLVITE ) tablet 1 mg (has no administration in time range)  methocarbamol  (ROBAXIN ) tablet 500 mg (has no administration in time range)  gabapentin  (NEURONTIN ) capsule 300-600 mg (has no administration in time range)  heparin  injection 5,000 Units (has no administration in time range)  sodium chloride  flush (NS) 0.9 % injection 3 mL (has no administration in time range)  sodium chloride  flush (NS) 0.9 % injection 3 mL (has no administration in time range)  sodium chloride  flush (NS) 0.9 % injection 3 mL (has no administration in time range)  0.9 %  sodium chloride  infusion (has no administration in time range)  acetaminophen  (TYLENOL ) tablet 650 mg (has no administration in time range)    Or  acetaminophen  (TYLENOL ) suppository 650 mg (has no administration in time range)  ondansetron  (ZOFRAN ) tablet 4 mg (has no administration in time range)    Or  ondansetron  (ZOFRAN ) injection 4 mg (has no administration in time range)  lactated ringers  infusion (has no administration in time range)  potassium chloride  (KLOR-CON ) CR tablet 20 mEq (has no administration in time range)  ampicillin  (OMNIPEN) 2 g in sodium chloride  0.9 % 100 mL IVPB (has no administration in time range)  HYDROmorphone  (DILAUDID ) injection 1 mg (1 mg Intravenous Given 12/26/23 0149)  ondansetron  (ZOFRAN ) injection 4 mg (4 mg Intravenous Given 12/26/23 0148)  HYDROmorphone  (DILAUDID ) injection 1 mg (1 mg Intravenous Given 12/26/23 0403)  methocarbamol  (ROBAXIN ) tablet 500 mg (500 mg Oral Given 12/26/23 0403)  HYDROmorphone  (DILAUDID ) injection 1 mg (1 mg Intravenous Given 12/26/23 0525)    ED Course/ Medical Decision Making/ A&P                                 Medical Decision Making Amount and/or Complexity of Data Reviewed Labs: ordered. Radiology: ordered.  Risk Prescription drug management. Decision regarding  hospitalization.   Patient with history of RA, recent knee revision due  to infected prosthetic knee on PICC line antibiotics here for evaluation of injuries following a mechanical fall.  She has pain throughout the knee as well as the ankle.  Imaging is significant for right bimalleolar fracture.  Patient will be unable to be discharged home due to her significant pain from her knee, will not be able to ambulate with a nonweightbearing status.  Discussed with Dr. Charol Copas with orthopedics-recommendation for admission to [facility] under medicine service.  Orthopedic service will see the patient in consult.  Discussed with patient findings of studies.  Reduction performed per note and she was placed in a splint.  Medicine consulted for admission for ongoing care.        Final Clinical Impression(s) / ED Diagnoses Final diagnoses:  Closed bimalleolar fracture of right ankle, initial encounter  Fall, initial encounter    Rx / DC Orders ED Discharge Orders     None         Kelsey Patricia, MD 12/26/23 [phone]

## 2023-12-26 NOTE — Care Plan (Signed)
 Orthopaedic Surgery Plan of Care Note   -pt reviewed at request of on call team -pt has right trimalleolar ankle fracture with syndesmosis disruption s/p closed reduction  -admitted to Hospitalist team -plan for ORIF right ankle on Sat 5/16 first round -please keep NPO and hold VTE ppx from midnight for surgery Saturday morning    Ali Ink, MD Orthopaedic Surgery [facility]

## 2023-12-26 NOTE — ED Triage Notes (Signed)
 Fall this evening with excrutiating Right knee pain. Just had surgery where the knee was scraped, some plastic replaced, and is on continuous IV abx. States she cannot handle this pain.

## 2023-12-26 NOTE — H&P (Addendum)
 History and Physical    SAVREEN MAHONY WUJ:[id number] DOB: [birth year] DOA: 12/26/2023  PCP: Laneta Pintos, MD   Patient coming from: Home   Chief Complaint:  Chief Complaint  Patient presents with   Fall   Knee Pain   ED TRIAGE note:  Fall this evening with excrutiating Right knee pain. Just had surgery where the knee was scraped, some plastic replaced, and is on continuous IV abx. States she cannot handle this pain.             HPI:  [name] is a 73 y.o. 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 Patient was ambulating with a walker when she fell down and struck her right knee.  Since the fall complaining about severe right-sided knee pain.  Patient had a surgery 2 weeks ago due to prosthetic knee infection. Denies loss of consciousness and head injury. Denies any fever and chills.  Patient reported she has recent lung function test which showed pulmonary fibrosis and she has been off amiodarone .  ED Course:  At presentation to ED patient is hemodynamically stable. CBC showing leukocytosis 16.6, stable H&H normal platelet count. BMP showing low potassium 3.2, elevated creatinine 1.37, low GFR 41.  X-ray of the right knee showed mild swelling of the soft tissue of the knee no periprosthetic fracture.  X-ray of the right ankle showing communicated and displaced by bimalleolar fracture.   ED physician Dr. Monique Ano spoke with  orthopedic surgeon Dr. Charol Copas who recommended admission to [facility] under medicine service.  Orthopedics will see the patient for formal consult.    Significant labs in the ED: Lab Orders         Basic metabolic panel         CBC with Differential         Sedimentation rate         C-reactive protein         CBC         Basic metabolic panel         Urinalysis, Routine w reflex microscopic -Urine, Clean Catch       Review of Systems:  Review of Systems  Constitutional:  Negative for chills, fever and weight loss.  Respiratory:  Negative for cough, sputum production and shortness of breath.   Cardiovascular:  Negative for chest pain and palpitations.  Gastrointestinal:  Negative for heartburn and nausea.  Musculoskeletal:  Positive for falls and joint pain. Negative for back pain and myalgias.       Right-sided knee joint pain  Neurological:  Negative for dizziness and headaches.  Psychiatric/Behavioral:  The patient is not nervous/anxious.     Past Medical History:  Diagnosis Date   Agatston coronary artery calcium  score greater than 400 05/2022   coronary Ca score 856   Anemia    Back pain    Blood transfusion    1981   Cancer (HCC)    basal cell carcinoma, x 3   on leg and bottom of spine   Chronic diastolic CHF (congestive heart failure) (  HCC)    diastolic    Chronic kidney disease    CKD3a   COVID    DDD (degenerative disc disease), cervical    DDD (degenerative disc disease), lumbar    Family history of coronary arteriosclerosis- strong fam h/o CAD and early CAD.  07/17/2017   Fibromyalgia    GERD (gastroesophageal reflux disease)    Heart murmur    as a child   Hiatal hernia    sjorgens syndrome   High blood pressure    High cholesterol    IBS (irritable bowel syndrome)    OSA (obstructive sleep apnea)    Osteoarthritis    Peripheral autonomic neuropathy of unknown cause    Pneumonia    Pre-diabetes    PVC (premature ventricular contraction)     Raynaud disease    Rheumatoid arthritis (HCC)    Sjogren's disease (HCC)    Urticaria    Vitamin D  deficiency     Past Surgical History:  Procedure Laterality Date   ABDOMINAL HYSTERECTOMY     BTL, BSO   ADENOIDECTOMY     BREAST EXCISIONAL BIOPSY Left    BREAST SURGERY     mass removal    CHOLECYSTECTOMY     dental implants     DIAGNOSTIC LAPAROSCOPY     x3   FEMUR FRACTURE SURGERY Right    I & D KNEE WITH POLY EXCHANGE Right 12/12/2023   Procedure: IRRIGATION AND DEBRIDEMENT KNEE WITH POLY EXCHANGE;  Surgeon: Liliane Rei, MD;  Location: [facility];  Service: Orthopedics;  Laterality: Right;   KNEE ARTHROSCOPY     x2   MASS EXCISION Left 03/22/2017   Procedure: EXCISION OF LEFT BREAST MASS;  Surgeon: Dareen Ebbing, MD;  Location: [facility];  Service: General;  Laterality: Left;   MULTIPLE TOOTH EXTRACTIONS  06/2023   x5   ORIF FEMUR FRACTURE Right 06/22/2022   Procedure: RIGHT OPEN REDUCTION INTERNAL FIXATION (ORIF) DISTAL FEMUR FRACTURE;  Surgeon: Hardy Lia, MD;  Location: [facility] OR;  Service: Orthopedics;  Laterality: Right;   patotid cystectomy     RIGHT/LEFT HEART CATH AND CORONARY ANGIOGRAPHY N/A 07/25/2019   Procedure: RIGHT/LEFT HEART CATH AND CORONARY ANGIOGRAPHY;  Surgeon: Mardell Shade, MD;  Location: [facility] INVASIVE CV LAB;  Service: Cardiovascular;  Laterality: N/A;   TENDON REPAIR  1980   left ankle and tibia   TONSILLECTOMY     TOTAL KNEE ARTHROPLASTY  06/21/2011   Procedure: TOTAL KNEE ARTHROPLASTY;  Surgeon: Aurther Blue;  Location: [facility];  Service: Orthopedics;  Laterality: Right;   TUBAL LIGATION       reports that she has never smoked. She has been exposed to tobacco smoke. She has never used smokeless tobacco. She reports current alcohol  use. She reports that she does not use drugs.  Allergies  Allergen Reactions   Sulfa Antibiotics Hives and Itching    Flushing   Cymbalta [Duloxetine Hcl] Other (See Comments)    Manic reaction   Demerol Hives  and Other (See Comments)    Fever    Ivp Dye [Iodinated Contrast Media] Hives and Other (See Comments)    Fever   Morphine  And Codeine Other (See Comments)    ineffective   Sulfasalazine Hives and Other (See Comments)    Flushing   Adhesive [Tape] Rash    Family History  Problem Relation Age of Onset   Alzheimer's disease Mother    Heart attack Mother    Hypertension Mother    Glaucoma Mother  High Cholesterol Mother    Heart disease Mother    Depression Mother    Cancer Father    High Cholesterol Father    Heart disease Father    Sleep apnea Father    Breast cancer Maternal Aunt    Heart attack Brother    Heart disease Brother    Glaucoma Brother    Hyperlipidemia Brother    Glaucoma Brother    Asthma Son    Allergic rhinitis Neg Hx    Angioedema Neg Hx    Eczema Neg Hx    Urticaria Neg Hx    Neuropathy Neg Hx     Prior to Admission medications   Medication Sig Start Date End Date Taking? Authorizing Provider  amiodarone  (PACERONE ) 200 MG tablet TAKE 1 TABLET (200 MG TOTAL) BY MOUTH DAILY. Patient not taking: Reported on 12/07/2023 12/21/22   Tammie Fall, MD  ampicillin  IVPB Inject 12 g into the vein daily. Indication:  E faecalis PJI First Dose: Yes Last Day of Therapy:  01/24/24 Labs - Once weekly:  CBC/D and BMP, Labs - Once weekly: ESR and CRP Method of administration: Ambulatory Pump (Continuous Infusion) Method of administration may be changed at the discretion of home infusion pharmacist based upon assessment of the patient and/or caregiver's ability to self-administer the medication ordered. 12/13/23 01/24/24  Perla Bradford, PA  aspirin  81 MG chewable tablet Chew 1 tablet (81 mg total) by mouth 2 (two) times daily for 19 days. Then resume an 81 mg aspirin  once a day. 12/14/23 01/02/24  Perla Bradford, PA  cetirizine  (ZYRTEC ) 10 MG tablet TAKE 1 TABLET (10 MG TOTAL) BY MOUTH DAILY. 10/12/23   Noreene Bearded, PA  colestipol  (COLESTID ) 1 g tablet Take 2 g  by mouth daily as needed (lowering cholesterol).    [provider]  cycloSPORINE  (RESTASIS ) 0.05 % ophthalmic emulsion Place 2 drops into both eyes 2 (two) times daily as needed (dry eyes).    [provider]  diphenoxylate-atropine  (LOMOTIL) 2.5-0.025 MG per tablet Take 1 tablet by mouth 4 (four) times daily as needed for diarrhea or loose stools.    [provider]  doxylamine , Sleep, (UNISOM ) 25 MG tablet Take 1 tablet (25 mg total) by mouth at bedtime as needed. Patient not taking: Reported on 12/07/2023 09/09/20   Jenean Minus, MD  fenofibrate  (TRICOR ) 48 MG tablet Take 1 tablet (48 mg total) by mouth daily. 08/20/23   Jacqueline Matsu, MD  fluticasone  (FLONASE ) 50 MCG/ACT nasal spray Place 1 spray into both nostrils daily. Patient not taking: Reported on 12/07/2023 06/14/21   Jenean Minus, MD  folic acid  (FOLVITE ) 1 MG tablet Take 1 tablet (1 mg total) by mouth daily. 06/01/23   Pasam, Gale Jude, MD  gabapentin  (NEURONTIN ) 300 MG capsule TAKE 2 CAPSULES BY MOUTH 2 TIMES DAILY. Patient taking differently: Take 300-600 mg by mouth See admin instructions. Take 300 mg by mouth in the morning and 600 mg at night 09/24/23   Millikan, Megan, NP  hydroxychloroquine  (PLAQUENIL ) 200 MG tablet TAKE 1 TABLET (200 MG TOTAL) BY MOUTH DAILY. 10/12/23   Romayne Clubs, PA-C  hyoscyamine  (ANASPAZ ) 0.125 MG TBDP disintergrating tablet Place 0.25 mg under the tongue every 6 (six) hours as needed for cramping.     [provider]  LORazepam  (ATIVAN ) 0.5 MG tablet At bedtime prn 11/02/23   Laneta Pintos, MD  methocarbamol  (ROBAXIN ) 500 MG tablet Take 1 tablet (500 mg total) by mouth every 6 (  six) hours as needed for muscle spasms. 12/14/23   Perla Bradford, PA  metoprolol  succinate (TOPROL -XL) 25 MG 24 hr tablet TAKE 1/2 TABLET BY MOUTH (12.5 MG TOTAL) DAILY 10/12/23   Jacqueline Matsu, MD  Multiple Vitamin (MULTIVITAMIN WITH MINERALS) TABS tablet Take 1 tablet by mouth  daily.    [provider]  NONFORMULARY OR COMPOUNDED ITEM Apply 1 application  topically daily as needed (irritation). Triamcinolone  0.1% & Silvadene cream 1:1- Apply as directed to affected areas as needed    [provider]  nystatin  (MYCOSTATIN /NYSTOP ) powder Apply 1 Application topically 3 (three) times daily. Patient taking differently: Apply 1 Application topically daily as needed (irritation). 12/28/22   Boscia, Heather E, NP  nystatin  cream (MYCOSTATIN ) Apply 1 Application topically daily.    [provider]  omeprazole (PRILOSEC) 40 MG capsule Take 40 mg by mouth daily as needed (acid reflux). 10/05/21   [provider]  ondansetron  (ZOFRAN ) 4 MG tablet Take 1 tablet (4 mg total) by mouth every 6 (six) hours as needed for nausea. 12/14/23   Perla Bradford, PA  oxyCODONE  (OXY IR/ROXICODONE ) 5 MG immediate release tablet Take 1-2 tablets (5-10 mg total) by mouth every 6 (six) hours as needed for severe pain (pain score 7-10). 12/14/23   Perla Bradford, PA  potassium chloride  (KLOR-CON ) 10 MEQ tablet Take 2 tablets (20 mEq total) by mouth daily. 05/04/21   Jenean Minus, MD  promethazine  (PHENERGAN ) 25 MG tablet TAKE 1 TABLET BY MOUTH EVERY 6 HOURS AS NEEDED. 02/08/23   Romayne Clubs, PA-C  rosuvastatin  (CRESTOR ) 10 MG tablet Take 1 tablet (10 mg total) by mouth daily. Please keep scheduled appointment for future refills. Thank you. 09/24/23   Jacqueline Matsu, MD  spironolactone  (ALDACTONE ) 25 MG tablet TAKE 1 TABLET (25 MG TOTAL) BY MOUTH DAILY. Patient taking differently: Take 12.5 mg by mouth daily. 06/08/23   Tammie Fall, MD  tirzepatide [facility]) 2.5 MG/0.5ML injection vial Inject 2.5 mg into the skin once a week. 11/02/23   Laneta Pintos, MD  torsemide  40 MG TABS Take 40 mg by mouth daily. 01/12/21   Bensimhon, Rheta Celestine, MD  traMADol  (ULTRAM ) 50 MG tablet Take 1-2 tablets (50-100 mg total) by mouth every 6 (six) hours as needed for moderate  pain (pain score 4-6). 12/14/23   Perla Bradford, PA     Physical Exam: Vitals:   12/26/23 0415 12/26/23 0430 12/26/23 0500 12/26/23 0530  BP: (!) 144/65 (!) 149/76 (!) 143/61 (!) 145/71  Pulse: 81 81 86 92  Resp: 16  16 16   Temp:    98.2 F (36.8 C)  TempSrc:    Oral  SpO2: 100% 97% 98% 99%  Weight:      Height:        Physical Exam Vitals and nursing note reviewed.  Constitutional:      Appearance: Normal appearance. She is not ill-appearing.  HENT:     Mouth/Throat:     Mouth: Mucous membranes are moist.  Eyes:     Pupils: Pupils are equal, round, and reactive to light.  Cardiovascular:     Rate and Rhythm: Normal rate and regular rhythm.     Pulses: Normal pulses.     Heart sounds: Normal heart sounds.  Pulmonary:     Effort: Pulmonary effort is normal.     Breath sounds: Normal breath sounds.  Abdominal:     General: Bowel sounds are normal.  Musculoskeletal:  General: Tenderness present. No signs of injury.     Cervical back: Neck supple.     Comments: Right-sided knee joint pain  Skin:    General: Skin is warm.     Capillary Refill: Capillary refill takes less than 2 seconds.  Neurological:     Mental Status: She is alert and oriented to person, place, and time.  Psychiatric:        Mood and Affect: Mood normal.        Thought Content: Thought content normal.        Judgment: Judgment normal.      Labs on Admission: I have personally reviewed following labs and imaging studies  CBC:   Media Information   Document Information  Photos    12/26/2023 05:14  Attached To:  Hospital Encounter on 12/26/23  Source Information  Kelsey Patricia, MD  [facility]  Document History    Basic Metabolic Panel: Recent Labs  Lab 12/26/23 0153  NA 137  K 3.2*  CL 103  CO2 22  GLUCOSE 124*  BUN 21  CREATININE 1.37*  CALCIUM  8.8*   GFR: Estimated Creatinine Clearance: 40.7 mL/min (A) (by C-G formula based on SCr of 1.37 mg/dL  (H)). Liver Function Tests: No results for input(s): "AST", "ALT", "ALKPHOS", "BILITOT", "PROT", "ALBUMIN" in the last 168 hours. No results for input(s): "LIPASE", "AMYLASE" in the last 168 hours. No results for input(s): "AMMONIA" in the last 168 hours. Coagulation Profile: No results for input(s): "INR", "PROTIME" in the last 168 hours. Cardiac Enzymes: No results for input(s): "CKTOTAL", "CKMB", "CKMBINDEX", "TROPONINI", "TROPONINIHS" in the last 168 hours. BNP (last 3 results) No results for input(s): "BNP" in the last 8760 hours. HbA1C: No results for input(s): "HGBA1C" in the last 72 hours. CBG: No results for input(s): "GLUCAP" in the last 168 hours. Lipid Profile: No results for input(s): "CHOL", "HDL", "LDLCALC", "TRIG", "CHOLHDL", "LDLDIRECT" in the last 72 hours. Thyroid  Function Tests: No results for input(s): "TSH", "T4TOTAL", "FREET4", "T3FREE", "THYROIDAB" in the last 72 hours. Anemia Panel: No results for input(s): "VITAMINB12", "FOLATE", "FERRITIN", "TIBC", "IRON ", "RETICCTPCT" in the last 72 hours. Urine analysis:    Component Value Date/Time   COLORURINE YELLOW 01/19/2022 1443   APPEARANCEUR CLEAR 01/19/2022 1443   LABSPEC 1.008 01/19/2022 1443   PHURINE < OR = 5.0 01/19/2022 1443   GLUCOSEU NEGATIVE 01/19/2022 1443   HGBUR NEGATIVE 01/19/2022 1443   BILIRUBINUR negative 07/28/2020 1351   KETONESUR NEGATIVE 01/19/2022 1443   PROTEINUR NEGATIVE 01/19/2022 1443   UROBILINOGEN 0.2 07/28/2020 1351   UROBILINOGEN 0.2 06/13/2011 1230   NITRITE NEGATIVE 01/19/2022 1443   LEUKOCYTESUR TRACE (A) 01/19/2022 1443    Radiological Exams on Admission: I have personally reviewed images DG Ankle 2 Views Right Result Date: 12/26/2023 CLINICAL DATA:  Complex right ankle fracture following closed reduction. EXAM: RIGHT ANKLE - 2 VIEW COMPARISON:  Earlier study right ankle series today at 2:07 a.m. FINDINGS: 5:39 a.m. AP and lateral two views only. There is overlying  fiberglass casting material. Alignment is improved. The distal fibular shaft fracture is now only displaced about 1 cortex width anteriorly and laterally and no longer angulated. Prior widening of the medial mortise has been reduced with the medial malleolar fracture fragment better in line with the parent bone. 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 No new fracture has become apparent. There is severe diffuse soft tissue swelling. IMPRESSION: 1. Improved alignment of the distal fibular shaft  fracture and medial malleolar fracture fragment following closed reduction. 2. Prior widening of the medial mortise has been reduced. 3. Severe diffuse soft tissue swelling. Electronically Signed   By: Denman Fischer M.D.   On: 12/26/2023 06:01   DG Ankle Complete Right Result Date: 12/26/2023 CLINICAL DATA:  fall EXAM: RIGHT ANKLE - COMPLETE 3+ VIEW COMPARISON:  X-ray right ankle 05/23/2022 FINDINGS: Acute comminuted and displaced medial malleolar and transsyndesmotic distal fibular fractures. No posterior malleolar fracture. No definite talar fracture. Midfoot degenerative changes. Plantar calcaneal spur. No aggressive appearing focal bone abnormality. Subcutaneus soft tissue edema. Vascular calcifications. IMPRESSION: Acute comminuted and displaced bimalleolar fracture. Electronically Signed   By: Morgane  Naveau M.D.   On: 12/26/2023 02:39   DG Knee Complete 4 Views Right Result Date: 12/26/2023 CLINICAL DATA:  Fall EXAM: RIGHT KNEE - COMPLETE 4+ VIEW COMPARISON:  None Available. FINDINGS: Markedly swollen soft tissues at the right knee with anterior skin staples. Status post total knee arthroplasty. Screw and plate fixation of the distal femur. No periprosthetic fracture IMPRESSION: Markedly swollen soft tissues at the right knee. No periprosthetic fracture. Electronically Signed   By: Juanetta Nordmann M.D.   On: 12/26/2023 02:34      Assessment/Plan: Principal Problem:   Ankle fracture, bimalleolar, closed Active Problems:   Peripheral neuropathy   Infection of prosthetic right knee joint (HCC)   Fall at home, initial encounter   Acute kidney injury superimposed on chronic kidney disease (HCC)   History of PVC (premature ventricular contraction)   Chronic diastolic CHF (congestive heart failure) (HCC)   GERD (gastroesophageal reflux disease)   Sjogren syndrome (HCC)   History of IBS   Hyperlipidemia   Essential hypertension   Infection of total right knee replacement (HCC)   History of rheumatoid arthritis   Hypokalemia    Assessment and Plan: Right-sided ankle fracture status post closed reduction Mechanical fall - Patient presented to emergency department due to mechanical fall at home.  See the fall patient is complaining about right-sided knee joint pain. - At presentation to ED patient is hemodynamically stable.  CBC showing mild leukocytosis.  CMP showing low potassium and elevated creatinine. - Surprisingly patient does not have any ankle pain and she is complaining about right-sided knee joint pain - X-ray of the right ankle showed Acute comminuted and displaced medial malleolar and transsyndesmotic distal fibular fractures. No posterior malleolar fracture. No definite talar fracture. Midfoot degenerative changes.  -Dr. Monique Ano has been reduced the ankle fracture and repeat x-ray showing improved alignment of the distal fibular shaft fracture and medial malleolar fracture fragment following closed reduction.  -X-ray of the right ankle showing markedly swollen soft tissue of the right knee no periprosthetic fracture. - Orthopedic surgeon Dr. Dr. Charol Copas will see patient in the daytime. - Continue pain control. -After orthopedic surgeon evaluation need to consult PT and OT for evaluation.  History of right-sided hip replacement 2022. History of right knee replacement 2012 Right knee joint  prosthetic infection status post I&D/30 and on IV ampicillin  via PICC line -Continue IV ampicillin  via PICC line. - Consulted pharmacy for assistance for ampicillin  dosing. -Checking ESR and CRP -Holding aspirin  81 mg twice daily as currently treating with IV heparin  for DVT prophylaxis.  Will resume aspirin  on discharge.  Chronic hypokalemia -Continue oral potassium.  Acute kidney injury on CKD stage IIIa -Elevated creatinine 1.37 and low GFR 41.  Concern for prerenal acute kidney injury in the setting of acute setting fracture and mild dehydration. -  Checking UA. - Starting maintenance fluid LR 75 cc/h.  History of PVC - Patient reported he is off amiodarone  due to recent diagnosis of pulmonary fibrosis and while he has been taking Toprol -XL. -Toprol -XL 12.5 mg daily  Chronic diastolic heart failure Essential hypertension - Continue Toprol -XL.  Holding Lasix  and spironolactone  in setting of AKI.  If renal function remains stable and improved will resume.  History of IBS -Continue hyoscyamine  as needed  Hyperlipidemia -Continue fenofibrate  and Crestor   History rheumatoid arthritis - Continue hydroxychloroquine  and folic acid .  Peripheral neuropathy - Continue gabapentin  300 mg in the morning and 600 milligram in the nighttime.  DVT prophylaxis:  SQ Heparin  Code Status:  Full Code Diet: Heart healthy diet Family Communication:   Family was present at bedside, at the time of interview. Opportunity was given to ask question and all questions were answered satisfactorily.  Disposition Plan: Pending orthopedic formal evaluation.  Monitor improvement of renal function. Consults: Orthopedic surgeon. Admission status:   Inpatient, Telemetry bed  Severity of Illness: The appropriate patient status for this patient is INPATIENT. Inpatient status is judged to be reasonable and necessary in order to provide the required intensity of service to ensure the patient's safety. The  patient's presenting symptoms, physical exam findings, and initial radiographic and laboratory data in the context of their chronic comorbidities is felt to place them at high risk for further clinical deterioration. Furthermore, it is not anticipated that the patient will be medically stable for discharge from the hospital within 2 midnights of admission.   * I certify that at the point of admission it is my clinical judgment that the patient will require inpatient hospital care spanning beyond 2 midnights from the point of admission due to high intensity of service, high risk for further deterioration and high frequency of surveillance required.Aaron Aas    Cieanna Stormes, MD [facility]  How to contact the [facility] Attending or Consulting provider 7A - 7P or covering provider during after hours 7P -7A, for this patient.  Check the care team in [facility] and look for a) attending/consulting [facility] provider listed and b) the [facility] team listed Log into [email or web address] and use [facility] universal password to access. If you do not have the password, please contact the hospital operator. Locate the [facility] provider you are looking for under [facility] and page to a number that you can be directly reached. If you still have difficulty reaching the provider, please page the [facility] (Director on Call) for the Hospitalists listed on amion for assistance.  12/26/2023, 7:02 AM

## 2023-12-26 NOTE — Progress Notes (Signed)
 PHARMACY CONSULT NOTE FOR:  OUTPATIENT  PARENTERAL ANTIBIOTIC THERAPY (OPAT)  Indication: R-knee PJI Regimen: Ampicillin  12g daily as a continuous infusion End date: 01/24/24 (6 weeks from start of abx on 5/1)  IV antibiotic discharge orders are pended. To discharging provider:  please sign these orders via discharge navigator,  Select New Orders & click on the button choice - Manage This Unsigned Work.     Thank you for allowing pharmacy to be a part of this patient's care.  Garland Junk, PharmD, BCPS, BCIDP Infectious Diseases Clinical Pharmacist 12/26/2023 7:43 AM   **Pharmacist phone directory can now be found on [email or web address] ([email or web address]).  Listed under [facility].

## 2023-12-26 NOTE — Consult Note (Signed)
 ORTHOPAEDIC CONSULTATION  REQUESTING PHYSICIAN: Trenton Frock, MD  PCP:  Laneta Pintos, MD  Chief Complaint: Right knee pain Right ankle pain  HPI: [name] is a 73 y.o. female who is status post I&D with poly exchange from 12/12/2023 presented to [facility] last evening after a mechanical ground level fall. She reports she was ambulating with her walker when she lost balance and landed on her right knee. She states she had immediate right knee pain although this is improving with rest and help of pain medication. She is on IV ampicillin  per ID recommendation.  She also notes right ankle pain. No prior history of right ankle injury or treatment.  Denies fever or chills.  Past Medical History:  Diagnosis Date   Agatston coronary artery calcium  score greater than 400 05/2022   coronary Ca score 856   Anemia    Back pain    Blood transfusion    1981   Cancer (HCC)    basal cell carcinoma, x 3   on leg and bottom of spine   Chronic diastolic CHF (congestive heart failure) (HCC)    diastolic    Chronic kidney disease    CKD3a   COVID    DDD (degenerative disc disease), cervical    DDD (degenerative disc disease), lumbar    Family history of coronary arteriosclerosis- strong fam h/o CAD and early CAD.  07/17/2017   Fibromyalgia    GERD (gastroesophageal reflux disease)    Heart murmur    as a child   Hiatal hernia    sjorgens syndrome   High blood pressure    High cholesterol    IBS (irritable bowel syndrome)    OSA (obstructive sleep apnea)    Osteoarthritis    Peripheral autonomic neuropathy of unknown cause    Pneumonia    Pre-diabetes    PVC (premature ventricular contraction)    Raynaud disease    Rheumatoid arthritis (HCC)    Sjogren's disease (HCC)    Urticaria    Vitamin D  deficiency    Past Surgical History:  Procedure Laterality Date   ABDOMINAL HYSTERECTOMY     BTL, BSO   ADENOIDECTOMY     BREAST EXCISIONAL BIOPSY Left    BREAST  SURGERY     mass removal    CHOLECYSTECTOMY     dental implants     DIAGNOSTIC LAPAROSCOPY     x3   FEMUR FRACTURE SURGERY Right    I & D KNEE WITH POLY EXCHANGE Right 12/12/2023   Procedure: IRRIGATION AND DEBRIDEMENT KNEE WITH POLY EXCHANGE;  Surgeon: Liliane Rei, MD;  Location: [facility];  Service: Orthopedics;  Laterality: Right;   KNEE ARTHROSCOPY     x2   MASS EXCISION Left 03/22/2017   Procedure: EXCISION OF LEFT BREAST MASS;  Surgeon: Dareen Ebbing, MD;  Location: [facility];  Service: General;  Laterality: Left;   MULTIPLE TOOTH EXTRACTIONS  06/2023   x5   ORIF FEMUR FRACTURE Right 06/22/2022   Procedure: RIGHT OPEN REDUCTION INTERNAL FIXATION (ORIF) DISTAL FEMUR FRACTURE;  Surgeon: Hardy Lia, MD;  Location: [facility];  Service: Orthopedics;  Laterality: Right;   patotid cystectomy     RIGHT/LEFT HEART CATH AND CORONARY ANGIOGRAPHY N/A 07/25/2019   Procedure: RIGHT/LEFT HEART CATH AND CORONARY ANGIOGRAPHY;  Surgeon: Mardell Shade, MD;  Location: [facility];  Service: Cardiovascular;  Laterality: N/A;   TENDON REPAIR  1980   left ankle and tibia  TONSILLECTOMY     TOTAL KNEE ARTHROPLASTY  06/21/2011   Procedure: TOTAL KNEE ARTHROPLASTY;  Surgeon: Aurther Blue;  Location: [facility];  Service: Orthopedics;  Laterality: Right;   TUBAL LIGATION     Social History   Socioeconomic History   Marital status: Widowed    Spouse name: Not on file   Number of children: 2   Years of education: AS   Highest education level: Bachelor's degree (e.g., BA, AB, BS)  Occupational History   Occupation: retired Engineer, civil (consulting)   Occupation: RN  Tobacco Use   Smoking status: Never    Passive exposure: Past   Smokeless tobacco: Never  Vaping Use   Vaping status: Never Used  Substance and Sexual Activity   Alcohol  use: Yes    Comment: occ   Drug use: No   Sexual activity: Never  Other Topics Concern   Not on file  Social History Narrative   Lives at home alone   Right-handed    Drinks 1 or less cups of coffee and 2 servings of either tea or soda per day   Social Drivers of Health   Financial Resource Strain: Low Risk  (07/29/2023)   Overall Financial Resource Strain (CARDIA)    Difficulty of Paying Living Expenses: Not hard at all  Food Insecurity: No Food Insecurity (12/12/2023)   Hunger Vital Sign    Worried About Running Out of Food in the Last Year: Never true    Ran Out of Food in the Last Year: Never true  Transportation Needs: No Transportation Needs (12/12/2023)   PRAPARE - Administrator, Civil Service (Medical): No    Lack of Transportation (Non-Medical): No  Physical Activity: Inactive (07/29/2023)   Exercise Vital Sign    Days of Exercise per Week: 0 days    Minutes of Exercise per Session: 30 min  Stress: No Stress Concern Present (07/29/2023)   [employer] of Occupational Health - Occupational Stress Questionnaire    Feeling of Stress : Only a little  Social Connections: Moderately Integrated (12/12/2023)   Social Connection and Isolation Panel [NHANES]    Frequency of Communication with Friends and Family: More than three times a week    Frequency of Social Gatherings with Friends and Family: Three times a week    Attends Religious Services: More than 4 times per year    Active Member of Clubs or Organizations: Yes    Attends Banker Meetings: More than 4 times per year    Marital Status: Widowed   Family History  Problem Relation Age of Onset   Alzheimer's disease Mother    Heart attack Mother    Hypertension Mother    Glaucoma Mother    High Cholesterol Mother    Heart disease Mother    Depression Mother    Cancer Father    High Cholesterol Father    Heart disease Father    Sleep apnea Father    Breast cancer Maternal Aunt    Heart attack Brother    Heart disease Brother    Glaucoma Brother    Hyperlipidemia Brother    Glaucoma Brother    Asthma Son    Allergic rhinitis Neg Hx    Angioedema Neg  Hx    Eczema Neg Hx    Urticaria Neg Hx    Neuropathy Neg Hx    Allergies  Allergen Reactions   Sulfa Antibiotics Hives and Itching    Flushing   Cymbalta [Duloxetine Hcl]  Other (See Comments)    Manic reaction   Demerol Hives and Other (See Comments)    Fever    Ivp Dye [Iodinated Contrast Media] Hives and Other (See Comments)    Fever   Nsaids Other (See Comments)    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 Authorizing Provider  amiodarone  (PACERONE ) 200 MG tablet TAKE 1 TABLET (200 MG TOTAL) BY MOUTH DAILY. Patient not taking: Reported on 12/07/2023 12/21/22   Tammie Fall, MD  ampicillin  IVPB Inject 12 g into the vein daily. Indication:  E faecalis PJI First Dose: Yes Last Day of Therapy:  01/24/24 Labs - Once weekly:  CBC/D and BMP, Labs - Once weekly: ESR and CRP Method of administration: Ambulatory Pump (Continuous Infusion) Method of administration may be changed at the discretion of home infusion pharmacist based upon assessment of the patient and/or caregiver's ability to self-administer the medication ordered. 12/13/23 01/24/24  Perla Bradford, PA  aspirin  81 MG chewable tablet Chew 1 tablet (81 mg total) by mouth 2 (two) times daily for 19 days. Then resume an 81 mg aspirin  once a day. Patient taking differently: Chew 81 mg by mouth 2 (two) times daily. Then resume an 81 mg aspirin  once a day. From 12/14/2023 to 12/31/2023 12/14/23 01/02/24  Perla Bradford, PA  cetirizine  (ZYRTEC ) 10 MG tablet TAKE 1 TABLET (10 MG TOTAL) BY MOUTH DAILY. 10/12/23   Noreene Bearded, PA  colestipol  (COLESTID ) 1 g tablet Take 2 g by mouth daily as needed (lowering cholesterol).    [provider]  cycloSPORINE  (RESTASIS ) 0.05 % ophthalmic emulsion Place 2 drops into both eyes 2 (two) times daily as needed (dry eyes).    [provider]  diphenoxylate-atropine  (LOMOTIL) 2.5-0.025 MG per tablet  Take 1 tablet by mouth 4 (four) times daily as needed for diarrhea or loose stools.    [provider]  fenofibrate  (TRICOR ) 48 MG tablet Take 1 tablet (48 mg total) by mouth daily. 08/20/23   Jacqueline Matsu, MD  folic acid  (FOLVITE ) 1 MG tablet Take 1 tablet (1 mg total) by mouth daily. 06/01/23   Pasam, Gale Jude, MD  gabapentin  (NEURONTIN ) 300 MG capsule TAKE 2 CAPSULES BY MOUTH 2 TIMES DAILY. Patient taking differently: Take 300-600 mg by mouth See admin instructions. Take 300 mg by mouth in the morning and 600 mg at night 09/24/23   Millikan, Megan, NP  hydroxychloroquine  (PLAQUENIL ) 200 MG tablet TAKE 1 TABLET (200 MG TOTAL) BY MOUTH DAILY. 10/12/23   Romayne Clubs, PA-C  hyoscyamine  (ANASPAZ ) 0.125 MG TBDP disintergrating tablet Place 0.125 mg under the tongue every 6 (six) hours as needed for cramping.    [provider]  LORazepam  (ATIVAN ) 0.5 MG tablet At bedtime prn Patient taking differently: Take 0.5 mg by mouth at bedtime as needed for anxiety, sedation or sleep. 11/02/23   Laneta Pintos, MD  methocarbamol  (ROBAXIN ) 500 MG tablet Take 1 tablet (500 mg total) by mouth every 6 (six) hours as needed for muscle spasms. 12/14/23   Perla Bradford, PA  metoprolol  succinate (TOPROL -XL) 25 MG 24 hr tablet TAKE 1/2 TABLET BY MOUTH (12.5 MG TOTAL) DAILY Patient taking differently: Take 12.5 mg by mouth daily. 10/12/23   Jacqueline Matsu, MD  Multiple Vitamin (MULTIVITAMIN WITH MINERALS) TABS tablet Take 1 tablet by mouth daily.    [provider]  NONFORMULARY OR  COMPOUNDED ITEM Apply 1 application  topically daily as needed (irritation). Triamcinolone  0.1% & Silvadene cream 1:1- Apply as directed to affected areas as needed    [provider]  nystatin  (MYCOSTATIN /NYSTOP ) powder Apply 1 Application topically 3 (three) times daily. Patient taking differently: Apply 1 Application topically daily as needed (irritation). 12/28/22   Boscia, Heather E, NP  nystatin  cream  (MYCOSTATIN ) Apply 1 Application topically daily.    [provider]  omeprazole (PRILOSEC) 40 MG capsule Take 40 mg by mouth daily as needed (acid reflux). 10/05/21   [provider]  ondansetron  (ZOFRAN ) 4 MG tablet Take 1 tablet (4 mg total) by mouth every 6 (six) hours as needed for nausea. 12/14/23   Perla Bradford, PA  oxyCODONE  (OXY IR/ROXICODONE ) 5 MG immediate release tablet Take 1-2 tablets (5-10 mg total) by mouth every 6 (six) hours as needed for severe pain (pain score 7-10). 12/14/23   Perla Bradford, PA  potassium chloride  (KLOR-CON ) 10 MEQ tablet Take 2 tablets (20 mEq total) by mouth daily. 05/04/21   Jenean Minus, MD  promethazine  (PHENERGAN ) 25 MG tablet TAKE 1 TABLET BY MOUTH EVERY 6 HOURS AS NEEDED. Patient taking differently: Take 25 mg by mouth every 6 (six) hours as needed for nausea, vomiting or refractory nausea / vomiting. 02/08/23   Romayne Clubs, PA-C  rosuvastatin  (CRESTOR ) 10 MG tablet Take 1 tablet (10 mg total) by mouth daily. Please keep scheduled appointment for future refills. Thank you. 09/24/23   Jacqueline Matsu, MD  spironolactone  (ALDACTONE ) 25 MG tablet TAKE 1 TABLET (25 MG TOTAL) BY MOUTH DAILY. Patient taking differently: Take 12.5 mg by mouth daily. 06/08/23   Tammie Fall, MD  tirzepatide [facility]) 2.5 MG/0.5ML injection vial Inject 2.5 mg into the skin once a week. 11/02/23   Laneta Pintos, MD  torsemide  40 MG TABS Take 40 mg by mouth daily. 01/12/21   Bensimhon, Rheta Celestine, MD  traMADol  (ULTRAM ) 50 MG tablet Take 1-2 tablets (50-100 mg total) by mouth every 6 (six) hours as needed for moderate pain (pain score 4-6). 12/14/23   Perla Bradford, PA   DG Ankle 2 Views Right Result Date: 12/26/2023 CLINICAL DATA:  Complex right ankle fracture following closed reduction. EXAM: RIGHT ANKLE - 2 VIEW COMPARISON:  Earlier study right ankle series today at 2:07 a.m. FINDINGS: 5:39 a.m. AP and lateral two views only. There is overlying  fiberglass casting material. Alignment is improved. The distal fibular shaft fracture is now only displaced about 1 cortex width anteriorly and laterally and no longer angulated. Prior widening of the medial mortise has been reduced with the medial malleolar fracture fragment better in line with the parent bone. 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 No new fracture has become apparent. There is severe diffuse soft tissue swelling. IMPRESSION: 1. Improved alignment of the distal fibular shaft fracture and medial malleolar fracture fragment following closed reduction. 2. Prior widening of the medial mortise has been reduced. 3. Severe diffuse soft tissue swelling. Electronically Signed   By: Denman Fischer M.D.   On: 12/26/2023 06:01   DG Ankle Complete Right Result Date: 12/26/2023 CLINICAL DATA:  fall EXAM: RIGHT ANKLE - COMPLETE 3+ VIEW COMPARISON:  X-ray right ankle 05/23/2022 FINDINGS: Acute comminuted and displaced medial malleolar and transsyndesmotic distal fibular fractures. No posterior malleolar fracture. No definite talar fracture. Midfoot degenerative changes. Plantar calcaneal spur. No aggressive appearing focal bone abnormality. Subcutaneus soft tissue edema.  Vascular calcifications. IMPRESSION: Acute comminuted and displaced bimalleolar fracture. Electronically Signed   By: Morgane  Naveau M.D.   On: 12/26/2023 02:39   DG Knee Complete 4 Views Right Result Date: 12/26/2023 CLINICAL DATA:  Fall EXAM: RIGHT KNEE - COMPLETE 4+ VIEW COMPARISON:  None Available. FINDINGS: Markedly swollen soft tissues at the right knee with anterior skin staples. Status post total knee arthroplasty. Screw and plate fixation of the distal femur. No periprosthetic fracture IMPRESSION: Markedly swollen soft tissues at the right knee. No periprosthetic fracture. Electronically Signed   By: Juanetta Nordmann M.D.   On: 12/26/2023 02:34    Positive ROS:  All other systems have been reviewed and were otherwise negative with the exception of those mentioned in the HPI and as above.  Physical Exam: General: Alert, no acute distress Cardiovascular: No pedal edema Respiratory: No cyanosis, no use of accessory musculature GI: No organomegaly, abdomen is soft and non-tender Skin: No lesions in the area of chief complaint Neurologic: Sensation intact distally Psychiatric: Patient is competent for consent with normal mood and affect Lymphatic: No axillary or cervical lymphadenopathy  MUSCULOSKELETAL: Right Knee Exam: Incision remains approximated with staples in place. Scant bloody drainage noted. Diffuse swelling throughout the knee. Tenderness noted medially. No palpable defect in extensor mechanism. Unable to perform SLR but this may be due to bulky splint and dressing on right ankle.  Right Ankle Exam: Patient in splint. Notable ecchymosis and swelling. Able to move toes.  Assessment: Status post right knee I&D with poly exchange. Increase in right knee pain Right ankle fracture  Plan: I reviewed her knee x-rays that do not show any periprosthetic abnormalities. I have low suspicion she may have disrupted her extensor mechanism. Will continue to monitor. IV ampicillin  per ID recommendations. Right ankle fracture reduced in ED but will likely require surgical intervention. I discussed this with the patient. Dr. France Ina will reach out to Dr. Cherl Corner for management of ankle. Hold aspirin .   Brinton Canavan, PA Orthopedic Surgery 12/26/2023 8:11 AM

## 2023-12-26 NOTE — Progress Notes (Signed)
   12/26/23 2249  BiPAP/CPAP/SIPAP  $ Non-Invasive Home Ventilator  Initial  BiPAP/CPAP/SIPAP Pt Type Adult  BiPAP/CPAP/SIPAP Resmed  Mask Type Full face mask  Dentures removed? Not applicable  FiO2 (%) 21 %  Patient Home Machine No  Patient Home Mask Yes  Patient Home Tubing No  Auto Titrate Yes  Minimum cmH2O 5 cmH2O  Maximum cmH2O 20 cmH2O  CPAP/SIPAP surface wiped down Yes  Device Plugged into RED Power Outlet Yes

## 2023-12-26 NOTE — ED Notes (Signed)
 RN applied nonadherent dressing with surgical tape over right knee.

## 2023-12-26 NOTE — Progress Notes (Signed)
 PROGRESS NOTE  [name]  ZOX:[id number] DOB: 1951/01/18 DOA: 12/26/2023 PCP: Laneta Pintos, MD  Consultants  Brief Narrative: 73 y.o. 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  Found to have trimalleolar ankle fracture with syndesmotic disruption.  Admitted for the same.   Assessment & Plan: Right-sided ankle fracture status post closed reduction Mechanical fall -X-ray showed trimalleolar fracture. - Orthopedics has been consulted.  Appreciate input. - Plan is for open reduction internal fixation of right ankle on Saturday 5/16. -Plan will be to keep patient n.p.o. and hold VTE midnight for surgery. -Continue current pain regimen. -Will need PT/OT after surgery.   History of right-sided hip replacement 2022. 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 -Holding aspirin  81 mg twice daily as currently treating with IV heparin  for DVT prophylaxis.  Will resume aspirin  on discharge. -Knee actually looks good on exam today.  No notable warmth when compared to left knee. -Bandage in place and clean dry intact.   Chronic hypokalemia -Continue oral potassium.   Acute kidney injury on CKD stage IIIa -Elevated creatinine 1.37 and low GFR 41.  Concern for prerenal acute kidney injury in the setting of acute setting fracture  and mild dehydration. -Continue maintenance fluid LR 75 cc/h. - Trend creatinine   History of PVC - Patient reported he is off amiodarone  due to recent diagnosis of pulmonary fibrosis and while he has been taking Toprol -XL. -Toprol -XL 12.5 mg daily   Chronic diastolic heart failure Essential hypertension - Continue Toprol -XL.  Holding Lasix  and spironolactone  in setting of AKI.  If renal function remains stable and improved will resume.   History of IBS -Continue hyoscyamine  as needed   Hyperlipidemia -Continue fenofibrate  and Crestor    History rheumatoid arthritis - Continue hydroxychloroquine  and folic acid .   Peripheral neuropathy - Continue gabapentin  300 mg in the morning and 600 milligram in the nighttime.  OSA: - Patient evidently uses CPAP at home.  Will order this for tonight.         DVT prophylaxis:  heparin  injection 5,000 Units Start: 12/26/23 1400 SCDs Start: 12/26/23 0630 Place TED hose Start: 12/26/23 0630  Code Status:   Code Status: Full Code Level of care: Telemetry Status is: Inpatient   Consults called: Orthopedics  Subjective: Patient awake and alert on my exam this afternoon.  No pain when she is laying in bed but does have fairly notable pain when she is moving around at all in bed.  Eating and drinking well.  Objective: Vitals:   12/26/23 0530 12/26/23 0824 12/26/23 1326 12/26/23 1331  BP: (!) 145/71 139/68 139/68 129/61  Pulse: 92 83 83 77  Resp: 16 15  16   Temp: 98.2 F (36.8 C) 98 F (36.7 C)  98.3 F (36.8 C)  TempSrc: Oral   Oral  SpO2: 99% 99%  99%  Weight:      Height:        Intake/Output Summary (Last 24 hours) at 12/26/2023  1638 Last data filed at 12/26/2023 1500 Gross per 24 hour  Intake 356.41 ml  Output --  Net 356.41 ml   Filed Weights   12/26/23 0105  Weight: 97.5 kg   Body mass index is 39.97 kg/m.  Gen: 73 y.o. female in no apparent distress.  Nontoxic Pulm: Non-labored breathing.  Clear to  auscultation bilaterally.  CV: Regular rate and rhythm. No murmur, rub, or gallop. No JVD GI: Abdomen soft, non-tender, non-distended, with normoactive bowel sounds. No organomegaly or masses felt. Ext: Warm, no deformities.  Right knee with joint tenderness and some mild swelling.  Bandage overlying patella clean dry and intact.  Right ankle splint in place.  Able to move toes.  Good sensation.  Good cap refill. Skin: No rashes, lesions  Neuro: Alert and oriented. No focal neurological deficits. Psych: Calm  Judgement and insight appear normal. Mood & affect appropriate.     I have personally reviewed the following labs and images: CBC: Recent Labs  Lab 12/26/23 0153  WBC 16.6*  NEUTROABS 14.6*  HGB 11.0*  HCT 36.4  MCV 91.0  PLT 277   BMP &GFR Recent Labs  Lab 12/26/23 0153  NA 137  K 3.2*  CL 103  CO2 22  GLUCOSE 124*  BUN 21  CREATININE 1.37*  CALCIUM  8.8*   Estimated Creatinine Clearance: 40.7 mL/min (A) (by C-G formula based on SCr of 1.37 mg/dL (H)). Liver & Pancreas: No results for input(s): "AST", "ALT", "ALKPHOS", "BILITOT", "PROT", "ALBUMIN" in the last 168 hours. No results for input(s): "LIPASE", "AMYLASE" in the last 168 hours. No results for input(s): "AMMONIA" in the last 168 hours. Diabetic: No results for input(s): "HGBA1C" in the last 72 hours. No results for input(s): "GLUCAP" in the last 168 hours. Cardiac Enzymes: No results for input(s): "CKTOTAL", "CKMB", "CKMBINDEX", "TROPONINI" in the last 168 hours. No results for input(s): "PROBNP" in the last 8760 hours. Coagulation Profile: No results for input(s): "INR", "PROTIME" in the last 168 hours. Thyroid  Function Tests: No results for input(s): "TSH", "T4TOTAL", "FREET4", "T3FREE", "THYROIDAB" in the last 72 hours. Lipid Profile: No results for input(s): "CHOL", "HDL", "LDLCALC", "TRIG", "CHOLHDL", "LDLDIRECT" in the last 72 hours. Anemia Panel: No results for input(s): "VITAMINB12", "FOLATE",  "FERRITIN", "TIBC", "IRON ", "RETICCTPCT" in the last 72 hours. Urine analysis:    Component Value Date/Time   COLORURINE YELLOW 01/19/2022 1443   APPEARANCEUR CLEAR 01/19/2022 1443   LABSPEC 1.008 01/19/2022 1443   PHURINE < OR = 5.0 01/19/2022 1443   GLUCOSEU NEGATIVE 01/19/2022 1443   HGBUR NEGATIVE 01/19/2022 1443   BILIRUBINUR negative 07/28/2020 1351   KETONESUR NEGATIVE 01/19/2022 1443   PROTEINUR NEGATIVE 01/19/2022 1443   UROBILINOGEN 0.2 07/28/2020 1351   UROBILINOGEN 0.2 06/13/2011 1230   NITRITE NEGATIVE 01/19/2022 1443   LEUKOCYTESUR TRACE (A) 01/19/2022 1443   Sepsis Labs: Invalid input(s): "PROCALCITONIN", "LACTICIDVEN"  Microbiology: No results found for this or any previous visit (from the past 240 hours).  Radiology Studies: CT ANKLE RIGHT WO CONTRAST Result Date: 12/26/2023 CLINICAL DATA:  Ankle trauma after fall. EXAM: CT OF THE RIGHT ANKLE WITHOUT CONTRAST TECHNIQUE: Multidetector CT imaging of the right ankle was performed according to the standard protocol. Multiplanar CT image reconstructions were also generated. RADIATION DOSE REDUCTION: This exam was performed according to the departmental dose-optimization program which includes automated exposure control, adjustment of the mA and/or kV according to patient size and/or use of iterative reconstruction technique. COMPARISON:  Right ankle radiographs dated 12/26/2023. FINDINGS: Bones/Joint/Cartilage Acute  distal tibial posterior malleolar fracture with approximately 3 mm above superior displacement of the posterior malleolar fracture fragment (series 7, images 23-29). Acute comminuted fracture of the medial malleolus with approximately 3 mm of medial displacement of the distal fracture component. Mild asymmetric narrowing of the medial clear space of the ankle mortise. Acute comminuted transverse oblique transsyndesmotic distal fibular fracture with approximately 4 mm of lateral displacement of the distal fracture  component. Adjacent osseous fragments along the anterolateral joint space likely reflect ligamentous avulsion injury. Calcaneal enthesopathy at the insertion of the Achilles tendon and the origin of the central cord of the plantar fascia. Mild degenerative changes of the tibiotalar, posterior subtalar, and talonavicular joints with joint space narrowing and osteophytosis. Ligaments Ligaments are suboptimally evaluated by CT. Muscles and Tendons Fatty atrophy of the intrinsic musculature of the foot. Visualized tendons are grossly unremarkable, although suboptimally evaluated on this exam. Soft tissue Circumferential subcutaneous edema of the right ankle extending to the foot. No loculated fluid collection identified. IMPRESSION: 1. Acute mildly displaced fracture of the posterior malleolus. 2. Acute comminuted mildly displaced fracture of the medial malleolus. Mild asymmetric narrowing of the medial clear space of the ankle mortise. 3. Acute comminuted mildly displaced oblique fracture of the distal fibula. Adjacent osseous fragments along the anterolateral joint space likely reflect ligamentous avulsion injury. 4. Diffuse subcutaneous edema of the right ankle and foot. No loculated fluid collection. Electronically Signed   By: Mannie Seek M.D.   On: 12/26/2023 13:45   DG Ankle 2 Views Right Result Date: 12/26/2023 CLINICAL DATA:  Complex right ankle fracture following closed reduction. EXAM: RIGHT ANKLE - 2 VIEW COMPARISON:  Earlier study right ankle series today at 2:07 a.m. FINDINGS: 5:39 a.m. AP and lateral two views only. There is overlying fiberglass casting material. Alignment is improved. The distal fibular shaft fracture is now only displaced about 1 cortex width anteriorly and laterally and no longer angulated. Prior widening of the medial mortise has been reduced with the medial malleolar fracture fragment better in line with the parent bone. Background changes of arthrosis of the tibiotalar and  midfoot articulations and plantar calcaneal spurring. No new fracture has become apparent. There is severe diffuse soft tissue swelling. IMPRESSION: 1. Improved alignment of the distal fibular shaft fracture and medial malleolar fracture fragment following closed reduction. 2. Prior widening of the medial mortise has been reduced. 3. Severe diffuse soft tissue swelling. Electronically Signed   By: Denman Fischer M.D.   On: 12/26/2023 06:01   DG Ankle Complete Right Result Date: 12/26/2023 CLINICAL DATA:  fall EXAM: RIGHT ANKLE - COMPLETE 3+ VIEW COMPARISON:  X-ray right ankle 05/23/2022 FINDINGS: Acute comminuted and displaced medial malleolar and transsyndesmotic distal fibular fractures. No posterior malleolar fracture. No definite talar fracture. Midfoot degenerative changes. Plantar calcaneal spur. No aggressive appearing focal bone abnormality. Subcutaneus soft tissue edema. Vascular calcifications. IMPRESSION: Acute comminuted and displaced bimalleolar fracture. Electronically Signed   By: Morgane  Naveau M.D.   On: 12/26/2023 02:39   DG Knee Complete 4 Views Right Result Date: 12/26/2023 CLINICAL DATA:  Fall EXAM: RIGHT KNEE - COMPLETE 4+ VIEW COMPARISON:  None Available. FINDINGS: Markedly swollen soft tissues at the right knee with anterior skin staples. Status post total knee arthroplasty. Screw and plate fixation of the distal femur. No periprosthetic fracture IMPRESSION: Markedly swollen soft tissues at the right knee. No periprosthetic fracture. Electronically Signed   By: Juanetta Nordmann M.D.   On: 12/26/2023 02:34    Scheduled Meds:  Chlorhexidine  Gluconate Cloth  6 each Topical Daily   fenofibrate   54 mg Oral Daily   folic acid   1 mg Oral Daily   [START ON 12/27/2023] gabapentin   300 mg Oral Daily   gabapentin   600 mg Oral QHS   heparin   5,000 Units Subcutaneous Q8H   metoprolol  succinate  12.5 mg Oral Daily   pantoprazole   40 mg Oral Daily   potassium chloride   20 mEq Oral Daily    rosuvastatin   10 mg Oral Daily   sodium chloride  flush  10-40 mL Intracatheter Q12H   sodium chloride  flush  3 mL Intravenous Q12H   sodium chloride  flush  3 mL Intravenous Q12H   Continuous Infusions:  sodium chloride      ampicillin  (OMNIPEN) IV     lactated ringers  100 mL/hr at 12/26/23 0652     LOS: 0 days   35 minutes with more than 50% spent in reviewing records, counseling patient/family and coordinating care.  Trenton Frock, MD [facility] [email or web address] 12/26/2023, 4:38 PM

## 2023-12-26 NOTE — ED Notes (Signed)
 RN informed Diplomatic Services operational officer to call ortho tech for splint application

## 2023-12-27 ENCOUNTER — Ambulatory Visit: Payer: PPO | Admitting: Adult Health

## 2023-12-27 LAB — CBC
HCT: 27.2 % — ABNORMAL LOW (ref 36.0–46.0)
Hemoglobin: 8.5 g/dL — ABNORMAL LOW (ref 12.0–15.0)
MCH: 28.2 pg (ref 26.0–34.0)
MCHC: 31.3 g/dL (ref 30.0–36.0)
MCV: 90.4 fL (ref 80.0–100.0)
Platelets: 193 10*3/uL (ref 150–400)
RBC: 3.01 MIL/uL — ABNORMAL LOW (ref 3.87–5.11)
RDW: 14.9 % (ref 11.5–15.5)
WBC: 8.2 10*3/uL (ref 4.0–10.5)
nRBC: 0 % (ref 0.0–0.2)

## 2023-12-27 LAB — BASIC METABOLIC PANEL WITH GFR
Anion gap: 7 (ref 5–15)
BUN: 16 mg/dL (ref 8–23)
CO2: 24 mmol/L (ref 22–32)
Calcium: 8.2 mg/dL — ABNORMAL LOW (ref 8.9–10.3)
Chloride: 104 mmol/L (ref 98–111)
Creatinine, Ser: 1.18 mg/dL — ABNORMAL HIGH (ref 0.44–1.00)
GFR, Estimated: 49 mL/min — ABNORMAL LOW (ref >60)
Glucose, Bld: 101 mg/dL — ABNORMAL HIGH (ref 70–99)
Potassium: 3.6 mmol/L (ref 3.5–5.1)
Sodium: 135 mmol/L (ref 135–145)

## 2023-12-27 MED ORDER — OXYCODONE HCL 5 MG PO TABS
5.0000 mg | ORAL_TABLET | Freq: Four times a day (QID) | ORAL | Status: DC | PRN
Start: 2023-12-27 — End: 2024-01-03
  Administered 2023-12-27 – 2023-12-29 (×6): 10 mg via ORAL
  Administered 2023-12-29: 5 mg via ORAL
  Administered 2023-12-30 – 2024-01-01 (×8): 10 mg via ORAL
  Administered 2024-01-02: 5 mg via ORAL
  Administered 2024-01-02 (×2): 10 mg via ORAL
  Filled 2023-12-27 (×2): qty 2
  Filled 2023-12-27: qty 1
  Filled 2023-12-27 (×6): qty 2
  Filled 2023-12-27: qty 1
  Filled 2023-12-27 (×9): qty 2

## 2023-12-27 MED ORDER — SODIUM CHLORIDE 0.9 % IV SOLN
2.0000 g | INTRAVENOUS | Status: DC
  Administered 2023-12-27 – 2024-01-02 (×36): 2 g via INTRAVENOUS
  Filled 2023-12-27 (×41): qty 2000

## 2023-12-27 NOTE — Progress Notes (Signed)
 PROGRESS NOTE  RUFUS MORRISETTE  BJY:[id number] DOB: 01/19/1951 DOA: 12/26/2023 PCP: Laneta Pintos, MD  Consultants  Brief Narrative: 73 y.o. 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  Found to have trimalleolar ankle fracture with syndesmotic disruption.  Admitted for the same.   Assessment & Plan: Right-sided ankle fracture status post closed reduction Mechanical fall -X-ray showed trimalleolar fracture. - Plan is for open reduction internal fixation of right ankle on Saturday 5/16. -Plan will be to keep patient n.p.o. and hold VTE prophylaxis midnight on Friday for surgery. -Continue current pain regimen.  Reports it is fairly well-controlled. -Will need PT/OT after surgery.   History of right-sided hip replacement 2022. 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 -Holding aspirin  81 mg twice daily as currently treating with IV heparin  for DVT prophylaxis.  Will resume aspirin  on discharge. -Bandage in place and clean dry intact.   Chronic hypokalemia -Resolved, will follow   Acute kidney injury on CKD stage IIIa -Elevated creatinine 1.37 and low GFR 41.  Concern for prerenal acute kidney injury in the setting of acute setting fracture and mild dehydration. -Now resolved back to her baseline CKD.  She seems to be  hydrating herself orally well and will DC any further IV fluids   History of PVC - Patient reported he is off amiodarone  due to recent diagnosis of pulmonary fibrosis and while he has been taking Toprol -XL. -Toprol -XL 12.5 mg daily   Chronic diastolic heart failure Essential hypertension - Continue Toprol -XL.  Holding Lasix  and spironolactone  in setting of AKI.  If renal function remains stable and improved will resume.   History of IBS -Continue hyoscyamine  as needed   Hyperlipidemia -Continue fenofibrate  and Crestor    History rheumatoid arthritis - Continue folic acid .   Peripheral neuropathy - Continue gabapentin  300 mg in the morning and 600 milligram in the nighttime.  OSA: - Patient evidently uses CPAP at home.  Will order this for tonight.         DVT prophylaxis:  heparin  injection 5,000 Units Start: 12/26/23 1400 SCDs Start: 12/26/23 0630 Place TED hose Start: 12/26/23 0630  Code Status:   Code Status: Full Code Level of care: Telemetry Status is: Inpatient   Consults called: Orthopedics  Subjective: Patient awake and alert on my exam this afternoon.  No pain when she is laying in bed but does have fairly notable pain when she is moving around at all in bed.  Eating and drinking well.  Objective: Vitals:   12/27/23 0226 12/27/23 0544 12/27/23 0848 12/27/23 1329  BP: 129/63 128/66 128/66 131/70  Pulse: 68 73 73 72  Resp: 18 18  18   Temp: 98.7 F (37.1 C) 98.7 F (37.1 C)  99.4 F (37.4 C)  TempSrc: Oral Oral  Oral  SpO2: 98% 97%  99%  Weight:      Height:        Intake/Output Summary (Last 24 hours) at 12/27/2023 1414 Last data filed  at 12/27/2023 1400 Gross per 24 hour  Intake 2711.41 ml  Output 1450 ml  Net 1261.41 ml   Filed Weights   12/26/23 0105  Weight: 97.5 kg   Body mass index is 39.97 kg/m.  Gen: 73 y.o. female in no apparent distress.  Nontoxic Pulm: Non-labored breathing.  Clear to auscultation bilaterally.  CV: Regular rate  and rhythm. No murmur, rub, or gallop. No JVD GI: Abdomen soft, non-tender, non-distended, with normoactive bowel sounds. No organomegaly or masses felt. Ext: Warm, no deformities.  Right knee with joint tenderness and some mild swelling.  Bandage overlying patella clean dry and intact.  Right ankle splint in place.  Able to move toes.  Good sensation.  Good cap refill. Skin: No rashes, lesions  Neuro: Alert and oriented. No focal neurological deficits. Psych: Calm  Judgement and insight appear normal. Mood & affect appropriate.     I have personally reviewed the following labs and images: CBC: Recent Labs  Lab 12/26/23 0153 12/27/23 0335  WBC 16.6* 8.2  NEUTROABS 14.6*  --   HGB 11.0* 8.5*  HCT 36.4 27.2*  MCV 91.0 90.4  PLT 277 193   BMP &GFR Recent Labs  Lab 12/26/23 0153 12/27/23 0335  NA 137 135  K 3.2* 3.6  CL 103 104  CO2 22 24  GLUCOSE 124* 101*  BUN 21 16  CREATININE 1.37* 1.18*  CALCIUM  8.8* 8.2*   Estimated Creatinine Clearance: 47.2 mL/min (A) (by C-G formula based on SCr of 1.18 mg/dL (H)). Liver & Pancreas: No results for input(s): "AST", "ALT", "ALKPHOS", "BILITOT", "PROT", "ALBUMIN" in the last 168 hours. No results for input(s): "LIPASE", "AMYLASE" in the last 168 hours. No results for input(s): "AMMONIA" in the last 168 hours. Diabetic: No results for input(s): "HGBA1C" in the last 72 hours. No results for input(s): "GLUCAP" in the last 168 hours. Cardiac Enzymes: No results for input(s): "CKTOTAL", "CKMB", "CKMBINDEX", "TROPONINI" in the last 168 hours. No results for input(s): "PROBNP" in the last 8760 hours. Coagulation Profile: No results for input(s): "INR", "PROTIME" in the last 168 hours. Thyroid  Function Tests: No results for input(s): "TSH", "T4TOTAL", "FREET4", "T3FREE", "THYROIDAB" in the last 72 hours. Lipid Profile: No results for input(s): "CHOL", "HDL", "LDLCALC", "TRIG", "CHOLHDL", "LDLDIRECT" in the last 72 hours. Anemia  Panel: No results for input(s): "VITAMINB12", "FOLATE", "FERRITIN", "TIBC", "IRON ", "RETICCTPCT" in the last 72 hours. Urine analysis:    Component Value Date/Time   COLORURINE YELLOW 01/19/2022 1443   APPEARANCEUR CLEAR 01/19/2022 1443   LABSPEC 1.008 01/19/2022 1443   PHURINE < OR = 5.0 01/19/2022 1443   GLUCOSEU NEGATIVE 01/19/2022 1443   HGBUR NEGATIVE 01/19/2022 1443   BILIRUBINUR negative 07/28/2020 1351   KETONESUR NEGATIVE 01/19/2022 1443   PROTEINUR NEGATIVE 01/19/2022 1443   UROBILINOGEN 0.2 07/28/2020 1351   UROBILINOGEN 0.2 06/13/2011 1230   NITRITE NEGATIVE 01/19/2022 1443   LEUKOCYTESUR TRACE (A) 01/19/2022 1443   Sepsis Labs: Invalid input(s): "PROCALCITONIN", "LACTICIDVEN"  Microbiology: No results found for this or any previous visit (from the past 240 hours).  Radiology Studies: No results found.   Scheduled Meds:  Chlorhexidine  Gluconate Cloth  6 each Topical Daily   fenofibrate   54 mg Oral Daily   folic acid   1 mg Oral Daily   gabapentin   300 mg Oral Daily   gabapentin   600 mg Oral QHS   heparin   5,000 Units Subcutaneous Q8H   metoprolol  succinate  12.5 mg Oral Daily   pantoprazole   40 mg  Oral Daily   potassium chloride   20 mEq Oral Daily   rosuvastatin   10 mg Oral Daily   sodium chloride  flush  10-40 mL Intracatheter Q12H   sodium chloride  flush  3 mL Intravenous Q12H   sodium chloride  flush  3 mL Intravenous Q12H   Continuous Infusions:  ampicillin  (OMNIPEN) IV       LOS: 1 day   35 minutes with more than 50% spent in reviewing records, counseling patient/family and coordinating care.  Trenton Frock, MD [facility] [email or web address] 12/27/2023, 2:14 PM

## 2023-12-27 NOTE — Progress Notes (Signed)
 PHARMACY NOTE:  ANTIMICROBIAL RENAL DOSAGE ADJUSTMENT  Current antimicrobial regimen includes a mismatch between antimicrobial dosage and estimated renal function.  As per policy approved by the Pharmacy & Therapeutics and Medical Executive Committees, the antimicrobial dosage will be adjusted accordingly.  Current antimicrobial dosage:  Ampicillin  2g IV every 6 hours  Indication: Enterococcal PJI  Renal Function:  Estimated Creatinine Clearance: 47.2 mL/min (A) (by C-G formula based on SCr of 1.18 mg/dL (H)). []      On intermittent HD, scheduled: []      On CRRT    Antimicrobial dosage has been changed to:  Ampicillin  2g IV every 4 hours  Additional comments: nCrCl~50 ml/min and SCr downtrending   Thank you for allowing pharmacy to be a part of this patient's care.  Garland Junk, PharmD, BCPS, BCIDP Infectious Diseases Clinical Pharmacist 12/27/2023 1:06 PM   **Pharmacist phone directory can now be found on [email or web address] ([email or web address]).  Listed under [facility].

## 2023-12-27 NOTE — Plan of Care (Signed)
 ?  Problem: Clinical Measurements: ?Goal: Will remain free from infection ?Outcome: Progressing ?  ?Problem: Clinical Measurements: ?Goal: Diagnostic test results will improve ?Outcome: Progressing ?  ?

## 2023-12-27 NOTE — Progress Notes (Signed)
 Orthopedic Tech Progress Note Patient Details:  [name] [birth year] [id number]  Ortho Devices Type of Ortho Device: Short leg splint, Stirrup splint Ortho Device/Splint Location: RLE Ortho Device/Splint Interventions: Ordered, Application, Adjustment   Post Interventions Patient Tolerated: Well Splint applied by ortho tech in training while I help fiberglass slabs in place following reduction in the ED on 05/14. [name] 12/27/2023, 7:19 PM

## 2023-12-27 NOTE — TOC Initial Note (Signed)
 Transition of Care [facility]) - Initial/Assessment Note    Patient Details  Name: [name] MRN: [id number] Date of Birth: May 23, 1951  Transition of Care [facility]) CM/SW Contact:    Delilah Fend, LCSW Phone Number: 12/27/2023, 11:09 AM  Clinical Narrative:                  Met with pt today to review anticipated dc needs.  Pt familiar to this CSW from recent hospitalization just a couple of weeks ago.  Very pleasant but admits frustration with her fall and new ankle fx.  Awaiting surgery that is currently planned for Saturday 5/17.  Pt notes that her son is very supportive, however, believes that SNF rehab "will be best this time... I'd like to go to [facility] if that's possible."  She notes that she anticipates being NWB after surgery and simply believes that her assistance needs will now be "too much" for family.  Notes she had a very positive experience with SNF rehab in 2023.  We discussed that I will reach out to [facility] to see if they may possibly have a bed available beginning of next week - pt agreeable.  Will continue to follow.  Expected Discharge Plan: Skilled Nursing Facility Barriers to Discharge: Continued Medical Work up   Patient Goals and CMS Choice Patient states their goals for this hospitalization and ongoing recovery are:: pt hopeful that have SNF rehab prior to return home CMS Medicare.gov Compare Post Acute Care list provided to:: Patient Choice offered to / list presented to : Patient [place] ownership interest in [facility].provided to:: Patient    Expected Discharge Plan and Services In-house Referral: Clinical Social Work   Post Acute Care Choice: Skilled Nursing Facility Living arrangements for the past 2 months: Single Family Home                 DME Arranged: N/A DME Agency: NA                  Prior Living Arrangements/Services Living arrangements for the past 2 months: Single Family Home Lives with:: Adult Children Patient  language and need for interpreter reviewed:: Yes        Need for Family Participation in Patient Care: Yes (Comment) Care giver support system in place?: Yes (comment) Current home services: Home PT, Home RN ([facility] and [facility] (home IV abx) following pt PTA) Criminal Activity/Legal Involvement Pertinent to Current Situation/Hospitalization: No - Comment as needed  Activities of Daily Living   ADL Screening (condition at time of admission) Independently performs ADLs?: Yes (appropriate for developmental age) Is the patient deaf or have difficulty hearing?: Yes Does the patient have difficulty seeing, even when wearing glasses/contacts?: No Does the patient have difficulty concentrating, remembering, or making decisions?: No  Permission Sought/Granted Permission sought to share information with : Facility Medical sales representative, Family Supports Permission granted to share information with : Yes, Verbal Permission Granted  Share Information with NAME: son, Aislinn Kowalke @ [phone]  Permission granted to share info w AGENCY: SNFs        Emotional Assessment Appearance:: Appears stated age Attitude/Demeanor/Rapport: Engaged, Gracious Affect (typically observed): Accepting, Pleasant Orientation: : Oriented to Self, Oriented to Place, Oriented to  Time, Oriented to Situation Alcohol  / Substance Use: Not Applicable Psych Involvement: No (comment)  Admission diagnosis:  Ankle fracture, bimalleolar, closed [S82.843A] Fall, initial encounter [W19.XXXA] Closed bimalleolar fracture of right ankle, initial encounter [W29.562Z] Patient Active Problem List   Diagnosis Date  Noted   Ankle fracture, bimalleolar, closed 12/26/2023   Fall at home, initial encounter 12/26/2023   History of rheumatoid arthritis 12/26/2023   Hypokalemia 12/26/2023   Acute kidney injury superimposed on chronic kidney disease (HCC) 12/26/2023   Itching 12/14/2023   Infection of prosthetic right knee joint (HCC)  12/12/2023   Infection of total right knee replacement (HCC) 12/12/2023   IBS (irritable bowel syndrome) 11/02/2023   IDA (iron  deficiency anemia) 06/05/2023   Normocytic normochromic anemia 06/05/2023   Normocytic anemia 06/01/2023   Situational anxiety 10/15/2022   Pressure sore of left ischial area, stage IV (HCC) 09/10/2022   DNR (do not resuscitate) 06/21/2022   Agatston coronary artery calcium  score greater than 400 05/25/2022   Laryngopharyngeal reflux (LPR) 10/10/2021   Chronic maxillary sinusitis 10/10/2021   Prediabetes 02/14/2021   Chronic kidney disease, stage 3b (HCC) 08/01/2020   Post-menopausal 09/02/2019   Essential hypertension 08/20/2018   Recurrent urticaria 05/20/2018   Vitamin D  deficiency 11/20/2017   Hyperlipidemia 11/20/2017   Raynaud's disease without gangrene 03/12/2017   Eosinophilic esophagitis 10/12/2016   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   History of IBS 09/03/2016   Autoimmune disease (HCC) 09/02/2016   Abnormality of gait 05/09/2016   Sjogren syndrome (HCC) 02/29/2016   GERD (gastroesophageal reflux disease) 01/19/2016   Glucose intolerance (impaired glucose tolerance) 01/19/2016   Chronic diastolic CHF (congestive heart failure) (HCC) 04/20/2015   Peripheral neuropathy 02/02/2014   Class 2 obesity due to excess calories with body mass index (BMI) of 36.0 to 36.9 in adult 11/17/2013   Heart murmur    OSA (obstructive sleep apnea)    Rheumatoid arthritis (HCC)    Hiatal hernia    Fibromyalgia    History of PVC (premature ventricular contraction)    Unilateral primary osteoarthritis, left knee 06/22/2011   PCP:  Laneta Pintos, MD Pharmacy:   [facility] - [place], [place] - [address] [place] [address] Phone: [phone] Fax: [phone]     Social Drivers of Health (SDOH) Social History: SDOH Screenings   Food Insecurity: No Food Insecurity (12/26/2023)  Housing: Low Risk   (12/26/2023)  Transportation Needs: No Transportation Needs (12/26/2023)  Utilities: Not At Risk (12/26/2023)  Alcohol  Screen: Low Risk  (07/29/2023)  Depression (PHQ2-9): Low Risk  (11/02/2023)  Financial Resource Strain: Low Risk  (07/29/2023)  Physical Activity: Inactive (07/29/2023)  Social Connections: Moderately Integrated (12/26/2023)  Stress: No Stress Concern Present (07/29/2023)  Tobacco Use: Low Risk  (12/26/2023)   SDOH Interventions:     Readmission Risk Interventions    12/27/2023   11:05 AM 12/13/2023    2:57 PM  Readmission Risk Prevention Plan  Transportation Screening Complete Complete  PCP or Specialist Appt within 5-7 Days  Complete  PCP or Specialist Appt within 3-5 Days Complete   Home Care Screening  Complete  Medication Review (RN CM)  Complete  HRI or Home Care Consult Complete   Social Work Consult for Recovery Care Planning/Counseling Complete   Palliative Care Screening Not Applicable   Medication Review Oceanographer) Complete

## 2023-12-28 DIAGNOSIS — S82841A Displaced bimalleolar fracture of right lower leg, initial encounter for closed fracture: Secondary | ICD-10-CM | POA: Diagnosis not present

## 2023-12-28 LAB — CBC
HCT: 25.4 % — ABNORMAL LOW (ref 36.0–46.0)
HCT: 26.3 % — ABNORMAL LOW (ref 36.0–46.0)
Hemoglobin: 7.8 g/dL — ABNORMAL LOW (ref 12.0–15.0)
Hemoglobin: 8.1 g/dL — ABNORMAL LOW (ref 12.0–15.0)
MCH: 28.2 pg (ref 26.0–34.0)
MCH: 28.2 pg (ref 26.0–34.0)
MCHC: 30.7 g/dL (ref 30.0–36.0)
MCHC: 30.8 g/dL (ref 30.0–36.0)
MCV: 91.7 fL (ref 80.0–100.0)
MCV: 91.6 fL (ref 80.0–100.0)
Platelets: 193 10*3/uL (ref 150–400)
Platelets: 188 10*3/uL (ref 150–400)
RBC: 2.77 MIL/uL — ABNORMAL LOW (ref 3.87–5.11)
RBC: 2.87 MIL/uL — ABNORMAL LOW (ref 3.87–5.11)
RDW: 14.6 % (ref 11.5–15.5)
RDW: 14.5 % (ref 11.5–15.5)
WBC: 8.9 10*3/uL (ref 4.0–10.5)
WBC: 7.3 10*3/uL (ref 4.0–10.5)
nRBC: 0 % (ref 0.0–0.2)
nRBC: 0 % (ref 0.0–0.2)

## 2023-12-28 LAB — BASIC METABOLIC PANEL WITH GFR
Anion gap: 9 (ref 5–15)
BUN: 13 mg/dL (ref 8–23)
CO2: 23 mmol/L (ref 22–32)
Calcium: 7.2 mg/dL — ABNORMAL LOW (ref 8.9–10.3)
Chloride: 101 mmol/L (ref 98–111)
Creatinine, Ser: 1.03 mg/dL — ABNORMAL HIGH (ref 0.44–1.00)
GFR, Estimated: 58 mL/min — ABNORMAL LOW (ref >60)
Glucose, Bld: 112 mg/dL — ABNORMAL HIGH (ref 70–99)
Potassium: 3.8 mmol/L (ref 3.5–5.1)
Sodium: 133 mmol/L — ABNORMAL LOW (ref 135–145)

## 2023-12-28 MED ORDER — ACETAMINOPHEN 325 MG PO TABS
650.0000 mg | ORAL_TABLET | Freq: Three times a day (TID) | ORAL | Status: DC
  Administered 2023-12-28 – 2024-01-02 (×15): 650 mg via ORAL
  Filled 2023-12-28 (×15): qty 2

## 2023-12-28 NOTE — Progress Notes (Signed)
 PROGRESS NOTE  [name]  MWU:[id number] DOB: [birth year] DOA: 12/26/2023 PCP: Laneta Pintos, MD  Consultants  Brief Narrative: 73 y.o. 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  Found to have trimalleolar ankle fracture with syndesmotic disruption.  Admitted for the same.   Assessment & Plan: Right-sided ankle fracture status post closed reduction Mechanical fall -X-ray showed trimalleolar fracture. - Plan is for open reduction internal fixation of right ankle on Saturday 5/16. -Plan will be to keep patient n.p.o. and hold VTE prophylaxis midnight on Friday for surgery. -Continue current pain regimen.  Reports it is fairly well-controlled. -Will need PT/OT after surgery.   History of right-sided hip replacement 2022. 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 -Holding aspirin  81 mg twice daily as currently treating with IV heparin  for DVT prophylaxis.  Will resume aspirin  on discharge. - holding heparin  today in light of planned surgery tomorrow -Bandage in place and clean dry intact.  Normocytic anemia: - Hgb dropped from 11 on admission.  More likely admit Hgb was hemoconcentrated - Has been ~8 since then.  Dropped to 7.8 today, repeat this PM to ensure not still dropping/below 8 in light of planned surgery  tomorrow.  - repeat hgb 8.1, no need to transfuse - transfusion threshold <7   Chronic hypokalemia -Resolved, will follow   Acute kidney injury on CKD stage IIIa -Elevated creatinine 1.37 and low GFR 41.  Concern for prerenal acute kidney injury in the setting of acute setting fracture and mild dehydration. -Now resolved back to her baseline CKD.  She seems to be hydrating herself orally well and will DC any further IV fluids   History of PVC - Patient reported he is off amiodarone  due to recent diagnosis of pulmonary fibrosis and while he has been taking Toprol -XL. -Toprol -XL 12.5 mg daily   Chronic diastolic heart failure Essential hypertension - Continue Toprol -XL.  Holding Lasix  and spironolactone  in setting of AKI.  If renal function remains stable and improved will resume.   History of IBS -Continue hyoscyamine  as needed   Hyperlipidemia -Continue fenofibrate  and Crestor    History rheumatoid arthritis - Continue folic acid .   Peripheral neuropathy - Continue gabapentin  300 mg in the morning and 600 milligram in the nighttime.  OSA: - Patient evidently uses CPAP at home.  Ordered for at night here as well.        DVT prophylaxis:  heparin  injection 5,000 Units Start: 12/26/23 1400 SCDs Start: 12/26/23 0630 Place TED hose Start: 12/26/23 0630  Code Status:   Code Status: Full Code Level of care: Telemetry Status is: Inpatient   Consults called: Orthopedics  Subjective: Patient awake and alert on my exam this afternoon. Pain better controlled than it has been.  Denies any overt bleeding or melena.  Feels well.   Objective: Vitals:   12/27/23 1329 12/27/23 2147 12/28/23 0541 12/28/23 1358  BP: 131/70 (!) 131/55 (!) 113/90 (!) 113/96  Pulse: 72 68 63 60  Resp: 18 18 18 18   Temp: 99.4 F (37.4 C) 100.2 F (37.9 C) 98.5 F (36.9 C) 98.2 F (36.8 C)  TempSrc: Oral Oral Oral Oral  SpO2: 99% 99% 99% 100%  Weight:      Height:        Intake/Output  Summary (Last 24 hours) at 12/28/2023 1540 Last data filed at 12/28/2023 1500 Gross per 24 hour  Intake 2283 ml  Output 3600 ml  Net -1317 ml   Filed Weights   12/26/23 0105  Weight: 97.5 kg   Body mass index is 39.97 kg/m.  Gen: 73 y.o. female in no apparent distress.  Nontoxic Pulm: Non-labored breathing.  Clear to auscultation bilaterally.  CV: Regular rate and rhythm. No murmur, rub, or gallop. No JVD GI: Abdomen soft, non-tender, non-distended, with normoactive bowel sounds. No organomegaly or masses felt. Ext: Warm, no deformities.  Right knee with joint tenderness and some mild swelling.  Bandage overlying patella clean dry and intact.  Right ankle splint in place.  Able to move toes.  Good sensation.  Good cap refill. Skin: No rashes, lesions  Neuro: Alert and oriented. No focal neurological deficits. Psych: Calm  Judgement and insight appear normal. Mood & affect appropriate.     I have personally reviewed the following labs and images: CBC: Recent Labs  Lab 12/26/23 0153 12/27/23 0335 12/28/23 0306 12/28/23 1308  WBC 16.6* 8.2 8.9 7.3  NEUTROABS 14.6*  --   --   --   HGB 11.0* 8.5* 7.8* 8.1*  HCT 36.4 27.2* 25.4* 26.3*  MCV 91.0 90.4 91.7 91.6  PLT 277 193 193 188   BMP &GFR Recent Labs  Lab 12/26/23 0153 12/27/23 0335 12/28/23 0306  NA 137 135 133*  K 3.2* 3.6 3.8  CL 103 104 101  CO2 22 24 23   GLUCOSE 124* 101* 112*  BUN 21 16 13   CREATININE 1.37* 1.18* 1.03*  CALCIUM  8.8* 8.2* 7.2*   Estimated Creatinine Clearance: 54.1 mL/min (A) (by C-G formula based on SCr of 1.03 mg/dL (H)). Liver & Pancreas: No results for input(s): "AST", "ALT", "ALKPHOS", "BILITOT", "PROT", "ALBUMIN" in the last 168 hours. No results for input(s): "LIPASE", "AMYLASE" in the last 168 hours. No results for input(s): "AMMONIA" in the last 168 hours. Diabetic: No results for input(s): "HGBA1C" in the last 72 hours. No results for input(s): "GLUCAP" in the last 168  hours. Cardiac Enzymes: No results for input(s): "CKTOTAL", "CKMB", "CKMBINDEX", "TROPONINI" in the last 168 hours. No results for input(s): "PROBNP" in the last 8760 hours. Coagulation Profile: No results for input(s): "INR", "PROTIME" in the last 168 hours. Thyroid  Function Tests: No results for input(s): "TSH", "T4TOTAL", "FREET4", "T3FREE", "THYROIDAB" in the last 72 hours. Lipid Profile: No results for input(s): "CHOL", "HDL", "LDLCALC", "TRIG", "CHOLHDL", "LDLDIRECT" in the last 72 hours. Anemia Panel: No results for input(s): "VITAMINB12", "FOLATE", "FERRITIN", "TIBC", "IRON ", "RETICCTPCT" in the last 72 hours. Urine analysis:    Component Value Date/Time   COLORURINE YELLOW 01/19/2022 1443   APPEARANCEUR CLEAR 01/19/2022 1443   LABSPEC 1.008 01/19/2022 1443   PHURINE < OR = 5.0 01/19/2022 1443   GLUCOSEU NEGATIVE 01/19/2022 1443   HGBUR NEGATIVE 01/19/2022 1443   BILIRUBINUR negative 07/28/2020 1351   KETONESUR NEGATIVE 01/19/2022 1443   PROTEINUR NEGATIVE 01/19/2022 1443   UROBILINOGEN 0.2 07/28/2020 1351   UROBILINOGEN 0.2 06/13/2011 1230   NITRITE NEGATIVE 01/19/2022 1443   LEUKOCYTESUR TRACE (A) 01/19/2022 1443   Sepsis Labs: Invalid input(s): "PROCALCITONIN", "LACTICIDVEN"  Microbiology: No results found for this or any previous visit (from the past 240 hours).  Radiology Studies: No results found.   Scheduled Meds:  acetaminophen   650 mg Oral TID   Chlorhexidine  Gluconate Cloth  6 each Topical Daily   fenofibrate   54 mg Oral Daily   folic acid   1 mg Oral Daily   gabapentin   300 mg Oral Daily   gabapentin   600 mg Oral QHS   heparin   5,000 Units Subcutaneous Q8H   metoprolol  succinate  12.5 mg Oral Daily   pantoprazole   40 mg Oral Daily   potassium chloride   20 mEq Oral Daily   rosuvastatin   10 mg Oral Daily   sodium chloride  flush  10-40 mL Intracatheter Q12H   sodium chloride  flush  3 mL Intravenous Q12H   sodium chloride  flush  3 mL Intravenous Q12H    Continuous Infusions:  ampicillin  (OMNIPEN) IV 2 g (12/28/23 1100)     LOS: 2 days   35 minutes with more than 50% spent in reviewing records, counseling patient/family and coordinating care.  Trenton Frock, MD [facility] [email or web address] 12/28/2023, 3:40 PM

## 2023-12-28 NOTE — Anesthesia Preprocedure Evaluation (Addendum)
 Anesthesia Evaluation  Patient identified by MRN, date of birth, ID band Patient awake    Reviewed: Allergy  & Precautions, H&P , NPO status , Patient's Chart, lab work & pertinent test results  Airway Mallampati: II  TM Distance: >3 FB Neck ROM: Full    Dental no notable dental hx. (+) Edentulous Upper, Partial Lower, Dental Advisory Given   Pulmonary sleep apnea    Pulmonary exam normal breath sounds clear to auscultation       Cardiovascular Exercise Tolerance: Good hypertension, Pt. on medications and Pt. on home beta blockers + CAD and +CHF   Rhythm:Regular Rate:Normal     Neuro/Psych   Anxiety     negative neurological ROS     GI/Hepatic Neg liver ROS, hiatal hernia,GERD  Medicated,,  Endo/Other    Class 3 obesity  Renal/GU Renal InsufficiencyRenal disease  negative genitourinary   Musculoskeletal  (+) Arthritis , Rheumatoid disorders,  Fibromyalgia -  Abdominal   Peds  Hematology  (+) Blood dyscrasia, anemia   Anesthesia Other Findings   Reproductive/Obstetrics negative OB ROS                             Anesthesia Physical Anesthesia Plan  ASA: 3  Anesthesia Plan: General   Post-op Pain Management: Regional block* and Tylenol  PO (pre-op)*   Induction: Intravenous  PONV Risk Score and Plan: 4 or greater and Ondansetron , Dexamethasone  and Treatment may vary due to age or medical condition  Airway Management Planned: LMA  Additional Equipment:   Intra-op Plan:   Post-operative Plan: Extubation in OR  Informed Consent: I have reviewed the patients History and Physical, chart, labs and discussed the procedure including the risks, benefits and alternatives for the proposed anesthesia with the patient or authorized representative who has indicated his/her understanding and acceptance.     Dental advisory given  Plan Discussed with: CRNA  Anesthesia Plan Comments:         Anesthesia Quick Evaluation

## 2023-12-28 NOTE — Plan of Care (Signed)
  Problem: Education: Goal: Knowledge of General Education information will improve Description: Including pain rating scale, medication(s)/side effects and non-pharmacologic comfort measures Outcome: Progressing   Problem: Activity: Goal: Risk for activity intolerance will decrease Outcome: Progressing   Problem: Elimination: Goal: Will not experience complications related to bowel motility Outcome: Progressing Goal: Will not experience complications related to urinary retention Outcome: Progressing   Problem: Elimination: Goal: Will not experience complications related to bowel motility Outcome: Progressing Goal: Will not experience complications related to urinary retention Outcome: Progressing   Problem: Coping: Goal: Level of anxiety will decrease Outcome: Progressing   Problem: Pain Managment: Goal: General experience of comfort will improve and/or be controlled Outcome: Progressing   Problem: Safety: Goal: Ability to remain free from injury will improve Outcome: Progressing

## 2023-12-28 NOTE — Plan of Care (Signed)
  Problem: Clinical Measurements: Goal: Diagnostic test results will improve Outcome: Progressing   Problem: Nutrition: Goal: Adequate nutrition will be maintained Outcome: Completed/Met

## 2023-12-28 NOTE — Progress Notes (Signed)
   12/28/23 0000  BiPAP/CPAP/SIPAP  BiPAP/CPAP/SIPAP Pt Type Adult  BiPAP/CPAP/SIPAP Resmed  Mask Type Full face mask  Dentures removed? Not applicable  Mask Size Medium  FiO2 (%) 21 %  Patient Home Machine No  Patient Home Mask Yes  Patient Home Tubing No  Auto Titrate Yes  Minimum cmH2O 5 cmH2O  Maximum cmH2O 20 cmH2O  Device Plugged into RED Power Outlet Yes

## 2023-12-29 ENCOUNTER — Other Ambulatory Visit: Payer: Self-pay

## 2023-12-29 ENCOUNTER — Encounter (HOSPITAL_COMMUNITY)
Admission: EM | Disposition: A | Discharge status: Skilled Nursing Facility | Payer: Self-pay | Source: Home / Self Care | Attending: Family Medicine

## 2023-12-29 ENCOUNTER — Inpatient Hospital Stay (HOSPITAL_COMMUNITY): Payer: Self-pay | Admitting: Anesthesiology

## 2023-12-29 ENCOUNTER — Inpatient Hospital Stay (HOSPITAL_COMMUNITY)

## 2023-12-29 DIAGNOSIS — I110 Hypertensive heart disease with heart failure: Secondary | ICD-10-CM | POA: Diagnosis not present

## 2023-12-29 DIAGNOSIS — S82841A Displaced bimalleolar fracture of right lower leg, initial encounter for closed fracture: Secondary | ICD-10-CM | POA: Diagnosis not present

## 2023-12-29 DIAGNOSIS — S82851A Displaced trimalleolar fracture of right lower leg, initial encounter for closed fracture: Secondary | ICD-10-CM

## 2023-12-29 DIAGNOSIS — I5032 Chronic diastolic (congestive) heart failure: Secondary | ICD-10-CM | POA: Diagnosis not present

## 2023-12-29 DIAGNOSIS — I2510 Atherosclerotic heart disease of native coronary artery without angina pectoris: Secondary | ICD-10-CM

## 2023-12-29 HISTORY — PX: ORIF ANKLE FRACTURE: SHX5408

## 2023-12-29 LAB — CBC
HCT: 25.4 % — ABNORMAL LOW (ref 36.0–46.0)
Hemoglobin: 7.7 g/dL — ABNORMAL LOW (ref 12.0–15.0)
MCH: 27.8 pg (ref 26.0–34.0)
MCHC: 30.3 g/dL (ref 30.0–36.0)
MCV: 91.7 fL (ref 80.0–100.0)
Platelets: 212 10*3/uL (ref 150–400)
RBC: 2.77 MIL/uL — ABNORMAL LOW (ref 3.87–5.11)
RDW: 14.5 % (ref 11.5–15.5)
WBC: 7.3 10*3/uL (ref 4.0–10.5)
nRBC: 0 % (ref 0.0–0.2)

## 2023-12-29 LAB — BASIC METABOLIC PANEL WITH GFR
Anion gap: 8 (ref 5–15)
BUN: 11 mg/dL (ref 8–23)
CO2: 24 mmol/L (ref 22–32)
Calcium: 8.1 mg/dL — ABNORMAL LOW (ref 8.9–10.3)
Chloride: 103 mmol/L (ref 98–111)
Creatinine, Ser: 1.18 mg/dL — ABNORMAL HIGH (ref 0.44–1.00)
GFR, Estimated: 49 mL/min — ABNORMAL LOW (ref >60)
Glucose, Bld: 126 mg/dL — ABNORMAL HIGH (ref 70–99)
Potassium: 4 mmol/L (ref 3.5–5.1)
Sodium: 135 mmol/L (ref 135–145)

## 2023-12-29 LAB — SURGICAL PCR SCREEN
MRSA, PCR: POSITIVE — AB
Staphylococcus aureus: POSITIVE — AB

## 2023-12-29 SURGERY — OPEN REDUCTION INTERNAL FIXATION (ORIF) ANKLE FRACTURE
Anesthesia: General | Site: Ankle | Laterality: Right

## 2023-12-29 MED ORDER — FENTANYL CITRATE (PF) 250 MCG/5ML IJ SOLN
INTRAMUSCULAR | Status: DC | PRN
  Administered 2023-12-29 (×2): 50 ug via INTRAVENOUS

## 2023-12-29 MED ORDER — HYDROMORPHONE HCL 1 MG/ML IJ SOLN: 0.2500 mg | INTRAMUSCULAR | Status: DC | PRN

## 2023-12-29 MED ORDER — LACTATED RINGERS IV SOLN: INTRAVENOUS | Status: DC | PRN

## 2023-12-29 MED ORDER — FENTANYL CITRATE (PF) 250 MCG/5ML IJ SOLN: INTRAMUSCULAR | Status: DC | PRN

## 2023-12-29 MED ORDER — LIDOCAINE 2% (20 MG/ML) 5 ML SYRINGE: INTRAMUSCULAR | Status: DC | PRN

## 2023-12-29 MED ORDER — ONDANSETRON HCL 4 MG/2ML IJ SOLN
INTRAMUSCULAR | Status: DC | PRN
  Administered 2023-12-29: 4 mg via INTRAVENOUS

## 2023-12-29 MED ORDER — DEXAMETHASONE SODIUM PHOSPHATE 10 MG/ML IJ SOLN
INTRAMUSCULAR | Status: DC | PRN
  Administered 2023-12-29: 5 mg via INTRAVENOUS

## 2023-12-29 MED ORDER — MIDAZOLAM HCL 2 MG/2ML IJ SOLN
INTRAMUSCULAR | Status: AC
  Filled 2023-12-29: qty 2

## 2023-12-29 MED ORDER — CEFAZOLIN SODIUM-DEXTROSE 1-4 GM/50ML-% IV SOLN
1.0000 g | Freq: Three times a day (TID) | INTRAVENOUS | Status: AC
Start: 2023-12-29 — End: 2023-12-29
  Administered 2023-12-29 (×2): 1 g via INTRAVENOUS
  Filled 2023-12-29 (×2): qty 50

## 2023-12-29 MED ORDER — VANCOMYCIN HCL IN DEXTROSE 1-5 GM/200ML-% IV SOLN
INTRAVENOUS | Status: AC
  Filled 2023-12-29: qty 200

## 2023-12-29 MED ORDER — CEFAZOLIN SODIUM-DEXTROSE 2-3 GM-%(50ML) IV SOLR
INTRAVENOUS | Status: DC | PRN
  Administered 2023-12-29: 2 g via INTRAVENOUS

## 2023-12-29 MED ORDER — FENTANYL CITRATE (PF) 100 MCG/2ML IJ SOLN
INTRAMUSCULAR | Status: AC
  Filled 2023-12-29: qty 2

## 2023-12-29 MED ORDER — MUPIROCIN 2 % EX OINT
1.0000 | TOPICAL_OINTMENT | Freq: Two times a day (BID) | CUTANEOUS | Status: DC
  Administered 2023-12-29 – 2024-01-02 (×9): 1 via NASAL
  Filled 2023-12-29: qty 22

## 2023-12-29 MED ORDER — LIDOCAINE HCL (PF) 2 % IJ SOLN
INTRAMUSCULAR | Status: DC | PRN
Start: 2023-12-29 — End: 2023-12-29
  Administered 2023-12-29: 40 mg via INTRADERMAL

## 2023-12-29 MED ORDER — PROPOFOL 10 MG/ML IV BOLUS
INTRAVENOUS | Status: AC
  Filled 2023-12-29: qty 20

## 2023-12-29 MED ORDER — VANCOMYCIN HCL 1000 MG IV SOLR
INTRAVENOUS | Status: DC | PRN
  Administered 2023-12-29: 1000 mg via INTRAVENOUS

## 2023-12-29 MED ORDER — PROPOFOL 10 MG/ML IV BOLUS
INTRAVENOUS | Status: DC | PRN
  Administered 2023-12-29: 100 mg via INTRAVENOUS

## 2023-12-29 MED ORDER — CEFAZOLIN SODIUM-DEXTROSE 2-4 GM/100ML-% IV SOLN
INTRAVENOUS | Status: AC
  Filled 2023-12-29: qty 100

## 2023-12-29 MED ORDER — VANCOMYCIN HCL 1000 MG IV SOLR
INTRAVENOUS | Status: AC
  Filled 2023-12-29: qty 20

## 2023-12-29 MED ORDER — 0.9 % SODIUM CHLORIDE (POUR BTL) OPTIME
TOPICAL | Status: DC | PRN
  Administered 2023-12-29: 1000 mL

## 2023-12-29 MED ORDER — BUPIVACAINE-EPINEPHRINE (PF) 0.5% -1:200000 IJ SOLN
INTRAMUSCULAR | Status: DC | PRN
  Administered 2023-12-29: 20 mL via PERINEURAL
  Administered 2023-12-29: 25 mL via PERINEURAL

## 2023-12-29 MED ORDER — VANCOMYCIN HCL IN DEXTROSE 1-5 GM/200ML-% IV SOLN
1000.0000 mg | Freq: Two times a day (BID) | INTRAVENOUS | Status: AC
  Administered 2023-12-29: 1000 mg via INTRAVENOUS
  Filled 2023-12-29: qty 200

## 2023-12-29 MED ORDER — MIDAZOLAM HCL 2 MG/2ML IJ SOLN
INTRAMUSCULAR | Status: DC | PRN
  Administered 2023-12-29: 2 mg via INTRAVENOUS

## 2023-12-29 SURGICAL SUPPLY — 52 items
BAG COUNTER SPONGE SURGICOUNT (BAG) IMPLANT
BAG ZIPLOCK 12X15 (MISCELLANEOUS) ×1 IMPLANT
BANDAGE ESMARK 6X9 LF (GAUZE/BANDAGES/DRESSINGS) ×1 IMPLANT
BIT DRILL 2.5X2.75 QC CALB (BIT) IMPLANT
BIT DRILL 2.9 CANN QC NONSTRL (BIT) IMPLANT
BIT DRILL 2.9X70 QC CALB (BIT) IMPLANT
BLADE SURG 15 STRL LF DISP TIS (BLADE) ×2 IMPLANT
BNDG COHESIVE 6X5 TAN ST LF (GAUZE/BANDAGES/DRESSINGS) ×1 IMPLANT
BNDG ELASTIC 4INX 5YD STR LF (GAUZE/BANDAGES/DRESSINGS) ×1 IMPLANT
BNDG ELASTIC 6INX 5YD STR LF (GAUZE/BANDAGES/DRESSINGS) ×1 IMPLANT
BNDG ELASTIC 6X10 VLCR STRL LF (GAUZE/BANDAGES/DRESSINGS) IMPLANT
BNDG GAUZE DERMACEA FLUFF 4 (GAUZE/BANDAGES/DRESSINGS) ×1 IMPLANT
CHLORAPREP W/TINT 26 (MISCELLANEOUS) ×2 IMPLANT
COVER SURGICAL LIGHT HANDLE (MISCELLANEOUS) ×1 IMPLANT
CUFF TRNQT CYL 34X4.125X (TOURNIQUET CUFF) ×1 IMPLANT
DRAPE C-ARM 42X120 X-RAY (DRAPES) IMPLANT
DRAPE C-ARMOR (DRAPES) ×1 IMPLANT
DRAPE EXTREMITY T 121X128X90 (DISPOSABLE) ×1 IMPLANT
DRAPE U-SHAPE 47X51 STRL (DRAPES) ×1 IMPLANT
DRSG AQUACEL AG ADV 3.5X10 (GAUZE/BANDAGES/DRESSINGS) IMPLANT
DRSG MEPILEX SACRM 8.7X9.8 (GAUZE/BANDAGES/DRESSINGS) IMPLANT
ELECT REM PT RETURN 15FT ADLT (MISCELLANEOUS) ×1 IMPLANT
GAUZE 4X4 16PLY ~~LOC~~+RFID DBL (SPONGE) ×1 IMPLANT
GAUZE PAD ABD 8X10 STRL (GAUZE/BANDAGES/DRESSINGS) ×5 IMPLANT
GAUZE SPONGE 4X4 12PLY STRL (GAUZE/BANDAGES/DRESSINGS) ×2 IMPLANT
GAUZE XEROFORM 1X8 LF (GAUZE/BANDAGES/DRESSINGS) ×1 IMPLANT
GLOVE BIOGEL PI IND STRL 7.5 (GLOVE) ×2 IMPLANT
GLOVE INDICATOR 7.5 STRL GRN (GLOVE) ×1 IMPLANT
GOWN STRL REUS W/ TWL LRG LVL3 (GOWN DISPOSABLE) ×1 IMPLANT
KIT BASIN OR (CUSTOM PROCEDURE TRAY) ×1 IMPLANT
KIT TURNOVER KIT A (KITS) IMPLANT
KWIRE ACE 1.6X6 (WIRE) IMPLANT
NS IRRIG 1000ML POUR BTL (IV SOLUTION) ×1 IMPLANT
PACK TOTAL JOINT (CUSTOM PROCEDURE TRAY) ×1 IMPLANT
PLATE LOCK 8H 103 BILAT FIB (Plate) IMPLANT
PROTECTOR NERVE ULNAR (MISCELLANEOUS) ×1 IMPLANT
SCREW ACE CAN 4.0 50M (Screw) IMPLANT
SCREW LOCK 3.5X12 DIST TIB (Screw) IMPLANT
SCREW LOCK 3.5X44 DIST TIB (Screw) IMPLANT
SCREW LOCK CORT STAR 3.5X12 (Screw) IMPLANT
SCREW LOCK CORT STAR 3.5X14 (Screw) IMPLANT
SCREW LOW PROFILE 12MMX3.5MM (Screw) IMPLANT
SCREW NLOCK CANC HEX 4X55 (Screw) IMPLANT
SCREW NLOCK CANC HEX 4X60 (Screw) IMPLANT
STOCKINETTE 4X48 STRL (DRAPES) ×1 IMPLANT
STRAP ANKLE DISTRACTOR (MISCELLANEOUS) ×1 IMPLANT
SUCTION TUBE FRAZIER 10FR DISP (SUCTIONS) ×1 IMPLANT
SUT ETHILON 3 0 PS 1 (SUTURE) ×2 IMPLANT
SUT VIC AB 0 CT1 36 (SUTURE) ×1 IMPLANT
SUT VIC AB 2-0 SH 27XBRD (SUTURE) ×1 IMPLANT
SUT VIC AB 3-0 SH 27X BRD (SUTURE) ×2 IMPLANT
WATER STERILE IRR 1000ML POUR (IV SOLUTION) ×1 IMPLANT

## 2023-12-29 NOTE — Anesthesia Postprocedure Evaluation (Signed)
 Anesthesia Post Note  Patient: [name]  Procedure(s) Performed: OPEN REDUCTION INTERNAL FIXATION (ORIF) ANKLE FRACTURE (Right: Ankle)     Patient location during evaluation: PACU Anesthesia Type: General and Regional Level of consciousness: awake and alert Pain management: pain level controlled Vital Signs Assessment: post-procedure vital signs reviewed and stable Respiratory status: spontaneous breathing, nonlabored ventilation and respiratory function stable Cardiovascular status: blood pressure returned to baseline and stable Postop Assessment: no apparent nausea or vomiting Anesthetic complications: no  No notable events documented.  Last Vitals:  Vitals:   12/29/23 1000 12/29/23 1036  BP: (!) 115/44 128/62  Pulse: (!) 58   Resp: 16 15  Temp:  36.7 C  SpO2: 94% 95%    Last Pain:  Vitals:   12/29/23 1036  TempSrc: Oral  PainSc: 0-No pain                 Shekelia Boutin,W. EDMOND

## 2023-12-29 NOTE — H&P (Signed)

## 2023-12-29 NOTE — Op Note (Signed)
 12/29/2023  9:34 AM   PATIENT: [name]  73 y.o. female  MRN: [id number]   PRE-OPERATIVE DIAGNOSIS:   Right displaced trimalleolar ankle fracture with syndesmosis disruption   POST-OPERATIVE DIAGNOSIS:   Same   PROCEDURE: 1] Right trimalleolar ankle fracture ORIF without internal fixation of posterior malleolus 2] Right ankle syndesmosis ORIF   SURGEON:  Ali Ink, MD   ASSISTANT: None   ANESTHESIA: General, regional   EBL: Minimal   TOURNIQUET:   65 min   COMPLICATIONS: None apparent   DISPOSITION: Extubated, awake and stable to recovery.   INDICATION FOR PROCEDURE: The patient presented with above diagnosis.  We discussed the diagnosis, alternative treatment options, risks and benefits of the above surgical intervention, as well as alternative non-operative treatments. All questions/concerns were addressed and the patient/family demonstrated appropriate understanding of the diagnosis, the procedure, the postoperative course, and overall prognosis. The patient wished to proceed with surgical intervention and signed an informed surgical consent as such, in each others presence prior to surgery.   PROCEDURE IN DETAIL: After preoperative consent was obtained and the correct operative site was identified, the patient was brought to the operating room supine on stretcher and transferred onto operating table. General anesthesia was induced. Preoperative antibiotics were administered. Surgical timeout was taken. The patient was then positioned supine with an ipsilateral hip bump. The operative lower extremity was prepped and draped in standard sterile fashion with a tourniquet around the thigh. The extremity was exsanguinated and the tourniquet was inflated to 275 mmHg.  A standard lateral incision was made over the distal fibula. Dissection was carried down to the level of the fibula and the fracture site identified. The superficial peroneal nerve was  identified and protected throughout the procedure. The fibula was noted to be shortened with interposed periosteum. The fibula was brought out to length. The fibula fracture was debrided and the edges defined to achieve cortical read. Reduction maneuver was performed using pointed reduction forceps and lobster forceps. In this manner, the fibula length was restored and fracture reduced. A lag screw was not placed given the orientation of fracture lines and comminution. Due to poor bone quality and extensive comminution at the fracture site, it was decided to use a locking distal fibula plate. We then selected a Zimmer locking plate to match the anatomy of the distal fibula and placed it laterally. This was implanted under intraoperative fluoroscopy with a combination of distal locking screws and proximal cortical & locking screws.  We then turned to the medial malleolar fracture. After obtaining reduction under fluoroscopy, a Kirschner wire was placed to secure this reduction and to serve as guide for a cannulated screw. We then made an incision around the wire and overdrilled this with a cannulated drill. We then placed a 4.0 mm Zimmer Biomet partially threaded lag screw. This screw was noted to achieve excellent compression across the fracture site and also have excellent purchase. We verified position of the screw and fracture reduction in all planes with fluoroscopy.  A manual external rotation stress radiograph was obtained and demonstrated widening of the ankle mortise. Given this intraoperative finding as well as preoperative subluxation, it was decided to reduce and fix the syndesmosis. Therefore a nonlocking quadricortical hex head 4.0 mm screw was implanted through the fibula plate in appropriate fashion to fix the syndesmosis. Screw position was verified along anteromedial tibial cortex by fluoroscopy. A repeat stress radiograph showed complete stability of the ankle mortise to testing. This was  reinforced with  an identical 2nd non locking screw and then a 3.5 mm square head MDS locking screw proximally.  The posterior malleolus fracture was openly reduced but deemed too small and comminuted to receive internal fixation.  The surgical sites were thoroughly irrigated. The tourniquet was deflated and hemostasis achieved. Betadine  and vancomycin  powder were applied. The deep layers were closed using 2-0 vicryl. The skin was closed without tension.    The leg was cleaned with saline and sterile dressings with gauze were applied. A well padded bulky short leg splint was applied. The patient was awakened from anesthesia and transported to the recovery room in stable condition.    FOLLOW UP PLAN: -transfer to PACU, then return to RNF -strict NWB operative extremity, maximum elevation -maintain short leg splint until follow up -DVT ppx per primary team -Vanc & Ancef  periop x 24 hr, definitely should get full course of IV abx for ongoing right TKA PJI to treat that as well as protected right ankle new hardware site -follow up as outpatient within 7-10 days for wound check with exchange of short leg splint to short leg cast -sutures out in 2-3 weeks in outpatient office   RADIOGRAPHS: AP, lateral, oblique and stress radiographs of the right ankle were obtained intraoperatively. These showed interval reduction and fixation of the fractures. Manual stress radiographs were taken and the joints were noted to be stable following fixation. All hardware is appropriately positioned and of the appropriate lengths. No other acute injuries are noted.   Ali Ink Orthopaedic Surgery [facility]

## 2023-12-29 NOTE — Progress Notes (Addendum)
 Subjective: * Day of Surgery * Procedure(s) (LRB): OPEN REDUCTION INTERNAL FIXATION (ORIF) ANKLE FRACTURE (Right)  Patient reports pain as appropriately controlled. Denies any new numbness/tingling. NPO since midnight.   Objective:   VITALS:  Temp:  [98.2 F (36.8 C)-99 F (37.2 C)] 99 F (37.2 C) (05/17 0700) Pulse Rate:  [60-69] 69 (05/17 0700) Resp:  [12-18] 12 (05/17 0700) BP: (113-125)/(45-96) 125/45 (05/17 0700) SpO2:  [96 %-100 %] 96 % (05/17 0700)  Gen: AAOx3, NAD  Right lower extremity: Short leg splint in place Wiggles toes SILT over toes CR<2s    LABS Recent Labs    12/27/23 0335 12/28/23 0306 12/28/23 1308 12/29/23 0332  HGB 8.5* 7.8* 8.1* 7.7*  WBC 8.2 8.9 7.3 7.3  PLT 193 193 188 212   Recent Labs    12/28/23 0306 12/29/23 0332  NA 133* 135  K 3.8 4.0  CL 101 103  CO2 23 24  BUN 13 11  CREATININE 1.03* 1.18*  GLUCOSE 112* 126*   No results for input(s): "LABPT", "INR" in the last 72 hours.   Assessment/Plan: * Day of Surgery * Procedure(s) (LRB): OPEN REDUCTION INTERNAL FIXATION (ORIF) ANKLE FRACTURE (Right)  -reviewed history, imaging and plan with patient/son at bedside -ORIF right ankle today with possible syndesmosis and/or deltoid fixation, possible allograft, possible external fixation -NPO and held VTE ppx since MN -PT/OT postop -MRSA + so will do periop Vanc + Ancef  (already on ampicillin  for right TKA PJI)  Ali Ink 12/29/2023, 7:47 AM  The risks and benefits were presented and reviewed. The risks due to hardware failure/irritation, new/persistent/recurrent infection, stiffness, nerve/vessel/tendon injury, nonunion/malunion of any fracture, wound healing issues, allograft usage, development of arthritis, failure of this surgery, possibility of external fixation in certain situations, possibility of delayed definitive surgery, need for further surgery, prolonged wound care including further soft tissue coverage  procedures, thromboembolic events, anesthesia/medical complications/events perioperatively and beyond, amputation, death among others were discussed. The patient acknowledged the explanation, agreed to proceed with the plan and a consent was signed.

## 2023-12-29 NOTE — Anesthesia Procedure Notes (Signed)
 Procedure Name: LMA Insertion Date/Time: 12/29/2023 7:52 AM  Performed by: Virgil Griffiths, CRNAPre-anesthesia Checklist: Patient identified, Emergency Drugs available, Suction available and Patient being monitored Patient Re-evaluated:Patient Re-evaluated prior to induction Oxygen Delivery Method: Circle System Utilized Preoxygenation: Pre-oxygenation with 100% oxygen Induction Type: IV induction Ventilation: Mask ventilation without difficulty LMA: LMA inserted LMA Size: 4.0 Number of attempts: 1 Airway Equipment and Method: Bite block Placement Confirmation: positive ETCO2 Tube secured with: Tape Dental Injury: Teeth and Oropharynx as per pre-operative assessment

## 2023-12-29 NOTE — Anesthesia Procedure Notes (Signed)
 Anesthesia Regional Block: Adductor canal block   Pre-Anesthetic Checklist: , timeout performed,  Correct Patient, Correct Site, Correct Laterality,  Correct Procedure, Correct Position, site marked,  Risks and benefits discussed,  Pre-op evaluation,  At surgeon's request and post-op pain management  Laterality: Right  Prep: Maximum Sterile Barrier Precautions used, chloraprep       Needles:  Injection technique: Single-shot  Needle Type: Echogenic Stimulator Needle     Needle Length: 9cm  Needle Gauge: 21     Additional Needles:   Procedures:,,,, ultrasound used (permanent image in chart),,    Narrative:  Start time: 12/29/2023 7:28 AM End time: 12/29/2023 7:35 AM Injection made incrementally with aspirations every 5 mL.  Performed by: Personally  Anesthesiologist: Jake Mayers, MD

## 2023-12-29 NOTE — Plan of Care (Signed)
  Problem: Education: Goal: Knowledge of General Education information will improve Description: Including pain rating scale, medication(s)/side effects and non-pharmacologic comfort measures 12/29/2023 0005 by Joen Muskrat, RN Outcome: Progressing 12/28/2023 2216 by Joen Muskrat, RN Outcome: Progressing   Problem: Activity: Goal: Risk for activity intolerance will decrease 12/29/2023 0005 by Joen Muskrat, RN Outcome: Progressing 12/28/2023 2216 by Joen Muskrat, RN Outcome: Progressing   Problem: Safety: Goal: Ability to remain free from injury will improve 12/29/2023 0005 by Joen Muskrat, RN Outcome: Progressing 12/28/2023 2216 by Joen Muskrat, RN Outcome: Progressing

## 2023-12-29 NOTE — Progress Notes (Addendum)
 PROGRESS NOTE  [name]  MWU:[id number] DOB: 1950-11-17 DOA: 12/26/2023 PCP: Laneta Pintos, MD  Consultants  Brief Narrative: 73 y.o. 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  Found to have trimalleolar ankle fracture with syndesmotic disruption.  Admitted for the same.   Assessment & Plan: Right-sided ankle fracture status post closed reduction Mechanical fall -X-ray showed trimalleolar fracture. Status post open reduction internal fixation earlier this morning. - Pain is currently under control. - She is hungry and ready to eat. -Will need PT/OT and likely rehab at discharge that she cannot bear weight on that right foot.Aaron Aas   History of right-sided hip replacement 2022. 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 -Holding aspirin  81 mg twice daily as currently treating with IV heparin  for DVT prophylaxis.  Will resume aspirin  on discharge. - holding heparin  today in light of planned surgery tomorrow -Bandage in place and clean dry intact. - MRSA nares swab noted.  Patient doing well without any fevers or chills and white count is normal.  Normocytic anemia: - Trend tomorrow since surgical repair of ankle was today. - transfusion threshold <7   Chronic hypokalemia -Resolved, will follow   Acute kidney injury  on CKD stage IIIa -Now resolved back to her baseline CKD.  She seems to be hydrating herself orally well and will DC any further IV fluids   History of PVC - Patient reported he is off amiodarone  due to recent diagnosis of pulmonary fibrosis and while he has been taking Toprol -XL. -Toprol -XL 12.5 mg daily   Chronic diastolic heart failure Essential hypertension - Continue Toprol -XL.  Holding Lasix  and spironolactone  in setting of AKI.  If renal function remains stable and improved will resume.   History of IBS -Continue hyoscyamine  as needed   Hyperlipidemia -Continue fenofibrate  and Crestor    History rheumatoid arthritis - Continue folic acid .   Peripheral neuropathy - Continue gabapentin  300 mg in the morning and 600 milligram in the nighttime.  OSA: - CPAP at night here       DVT prophylaxis:  SCDs Start: 12/26/23 0630 Place TED hose Start: 12/26/23 0630 - Currently being held until 24 hours s/p surgery, to resume 5/18  Code Status:   Code Status: Full Code Level of care: Telemetry Status is: Inpatient   Consults called: Orthopedics  Subjective: Patient seen after surgery.  Awake and alert.  Not feeling any pain currently since she had nerve block during surgery.  Hungry and ready to eat  Objective: Vitals:   12/29/23 1000 12/29/23 1036 12/29/23 1150 12/29/23 1250  BP: (!) 115/44 128/62 125/63 (!) 133/53  Pulse: (!) 58  64   Resp: 16 15 16 16   Temp:  98 F (36.7 C) 97.7 F (36.5 C) 98.4 F (36.9 C)  TempSrc:  Oral Oral Oral  SpO2: 94% 95%  99%  Weight:      Height:        Intake/Output Summary (Last 24 hours) at 12/29/2023  1455 Last data filed at 12/29/2023 1330 Gross per 24 hour  Intake 1660 ml  Output 2940 ml  Net -1280 ml   Filed Weights   12/26/23 0105  Weight: 97.5 kg   Body mass index is 39.97 kg/m.  Gen: 72 y.o. female in no apparent distress.  Nontoxic Pulm: Non-labored breathing.  Clear to auscultation bilaterally.  CV: Regular rate  and rhythm. No murmur, rub, or gallop. No JVD GI: Abdomen soft, non-tender, non-distended Ext: Warm, no deformities.  Right knee with joint tenderness and some mild swelling that has improved.  Bandage overlying patella clean dry and intact.  Right cast in place.  Able to move toes with good cap refill Skin: No rashes, lesions  Neuro: Alert and oriented. No focal neurological deficits. Psych: Calm  Judgement and insight appear normal. Mood & affect appropriate.     I have personally reviewed the following labs and images: CBC: Recent Labs  Lab 12/26/23 0153 12/27/23 0335 12/28/23 0306 12/28/23 1308 12/29/23 0332  WBC 16.6* 8.2 8.9 7.3 7.3  NEUTROABS 14.6*  --   --   --   --   HGB 11.0* 8.5* 7.8* 8.1* 7.7*  HCT 36.4 27.2* 25.4* 26.3* 25.4*  MCV 91.0 90.4 91.7 91.6 91.7  PLT 277 193 193 188 212   BMP &GFR Recent Labs  Lab 12/26/23 0153 12/27/23 0335 12/28/23 0306 12/29/23 0332  NA 137 135 133* 135  K 3.2* 3.6 3.8 4.0  CL 103 104 101 103  CO2 22 24 23 24   GLUCOSE 124* 101* 112* 126*  BUN 21 16 13 11   CREATININE 1.37* 1.18* 1.03* 1.18*  CALCIUM  8.8* 8.2* 7.2* 8.1*   Estimated Creatinine Clearance: 47.2 mL/min (A) (by C-G formula based on SCr of 1.18 mg/dL (H)). Liver & Pancreas: No results for input(s): "AST", "ALT", "ALKPHOS", "BILITOT", "PROT", "ALBUMIN" in the last 168 hours. No results for input(s): "LIPASE", "AMYLASE" in the last 168 hours. No results for input(s): "AMMONIA" in the last 168 hours. Diabetic: No results for input(s): "HGBA1C" in the last 72 hours. No results for input(s): "GLUCAP" in the last 168 hours. Cardiac Enzymes: No results for input(s): "CKTOTAL", "CKMB", "CKMBINDEX", "TROPONINI" in the last 168 hours. No results for input(s): "PROBNP" in the last 8760 hours. Coagulation Profile: No results for input(s): "INR", "PROTIME" in the last 168 hours. Thyroid  Function Tests: No results for input(s): "TSH", "T4TOTAL", "FREET4", "T3FREE",  "THYROIDAB" in the last 72 hours. Lipid Profile: No results for input(s): "CHOL", "HDL", "LDLCALC", "TRIG", "CHOLHDL", "LDLDIRECT" in the last 72 hours. Anemia Panel: No results for input(s): "VITAMINB12", "FOLATE", "FERRITIN", "TIBC", "IRON ", "RETICCTPCT" in the last 72 hours. Urine analysis:    Component Value Date/Time   COLORURINE YELLOW 01/19/2022 1443   APPEARANCEUR CLEAR 01/19/2022 1443   LABSPEC 1.008 01/19/2022 1443   PHURINE < OR = 5.0 01/19/2022 1443   GLUCOSEU NEGATIVE 01/19/2022 1443   HGBUR NEGATIVE 01/19/2022 1443   BILIRUBINUR negative 07/28/2020 1351   KETONESUR NEGATIVE 01/19/2022 1443   PROTEINUR NEGATIVE 01/19/2022 1443   UROBILINOGEN 0.2 07/28/2020 1351   UROBILINOGEN 0.2 06/13/2011 1230   NITRITE NEGATIVE 01/19/2022 1443   LEUKOCYTESUR TRACE (A) 01/19/2022 1443   Sepsis Labs: Invalid input(s): "PROCALCITONIN", "LACTICIDVEN"  Microbiology: Recent Results (from the past 240 hours)  Surgical PCR screen     Status: Abnormal   Collection Time: 12/29/23 12:27 AM   Specimen: Nasal Mucosa; Nasal Swab  Result Value Ref Range Status   MRSA, PCR POSITIVE (A) NEGATIVE Final  Comment: RESULT CALLED TO, READ BACK BY AND VERIFIED WITH: PRESS, A. RN AT 12/29/2023 0231 BY JEREMY C    Staphylococcus aureus POSITIVE (A) NEGATIVE Final    Comment: (NOTE) The Xpert SA Assay (FDA approved for NASAL specimens in patients 90 years of age and older), is one component of a comprehensive surveillance program. It is not intended to diagnose infection nor to guide or monitor treatment. Performed at [facility], [address], [place], [place] [address]     Radiology Studies: DG Ankle 2 Views Right Result Date: 12/29/2023 CLINICAL DATA:  Elective surgery. Intraoperative fluoroscopy for right ankle ORIF. EXAM: RIGHT ANKLE - 2 VIEW COMPARISON:  Right ankle radiographs 12/26/2023 FINDINGS: Images were performed intraoperatively without the presence of a  radiologist. New lateral plate and screw fixation of the distal fibula. This includes 3 distal tibiofibular syndesmosis interlocking screws. Single screw ORIF of the medial malleolus. The ankle mortise is symmetric and intact. Total fluoroscopy images: 3 Total fluoroscopy time: 84 seconds Total dose: Radiation Exposure Index (as provided by the fluoroscopic device): 3.1 mGy air Kerma Please see intraoperative findings for further detail. IMPRESSION: Intraoperative fluoroscopy for right ankle ORIF. Electronically Signed   By: Bertina Broccoli M.D.   On: 12/29/2023 10:44   DG C-Arm 1-60 Min-No Report Result Date: 12/29/2023 Fluoroscopy was utilized by the requesting physician.  No radiographic interpretation.   DG C-Arm 1-60 Min-No Report Result Date: 12/29/2023 Fluoroscopy was utilized by the requesting physician.  No radiographic interpretation.     Scheduled Meds:  acetaminophen   650 mg Oral TID   Chlorhexidine  Gluconate Cloth  6 each Topical Daily   fenofibrate   54 mg Oral Daily   folic acid   1 mg Oral Daily   gabapentin   300 mg Oral Daily   gabapentin   600 mg Oral QHS   metoprolol  succinate  12.5 mg Oral Daily   mupirocin  ointment  1 Application Nasal BID   pantoprazole   40 mg Oral Daily   potassium chloride   20 mEq Oral Daily   rosuvastatin   10 mg Oral Daily   sodium chloride  flush  10-40 mL Intracatheter Q12H   sodium chloride  flush  3 mL Intravenous Q12H   sodium chloride  flush  3 mL Intravenous Q12H   Continuous Infusions:  ampicillin  (OMNIPEN) IV 2 g (12/29/23 1054)    ceFAZolin  (ANCEF ) IV     vancomycin        LOS: 3 days   35 minutes with more than 50% spent in reviewing records, counseling patient/family and coordinating care.  Trenton Frock, MD [facility] [email or web address] 12/29/2023, 2:55 PM

## 2023-12-29 NOTE — Transfer of Care (Signed)
 Immediate Anesthesia Transfer of Care Note  Patient: [name]  Procedure(s) Performed: OPEN REDUCTION INTERNAL FIXATION (ORIF) ANKLE FRACTURE (Right: Ankle)  Patient Location: PACU  Anesthesia Type:General  Level of Consciousness: awake, alert , and oriented  Airway & Oxygen Therapy: Patient Spontanous Breathing and Patient connected to face mask oxygen  Post-op Assessment: Report given to RN and Post -op Vital signs reviewed and stable  Post vital signs: Reviewed and stable  Last Vitals:  Vitals Value Taken Time  BP 135/66 12/29/23 0930  Temp    Pulse 65 12/29/23 0930  Resp 17 12/29/23 0930  SpO2 98 % 12/29/23 0930  Vitals shown include unfiled device data.  Last Pain:  Vitals:   12/29/23 0706  TempSrc:   PainSc: Asleep      Patients Stated Pain Goal: 0 (12/28/23 2010)  Complications: No notable events documented.

## 2023-12-29 NOTE — Progress Notes (Signed)
   12/29/23 2200  BiPAP/CPAP/SIPAP  Reason BIPAP/CPAP not in use Other(comment) (patient is not ready to put the cpap on at this time. She stated that she can put it on herself when she gets ready to use it.)

## 2023-12-29 NOTE — Anesthesia Procedure Notes (Signed)
 Anesthesia Regional Block: Popliteal block   Pre-Anesthetic Checklist: , timeout performed,  Correct Patient, Correct Site, Correct Laterality,  Correct Procedure, Correct Position, site marked,  Risks and benefits discussed,  Pre-op evaluation,  At surgeon's request and post-op pain management  Laterality: Right  Prep: Maximum Sterile Barrier Precautions used, chloraprep       Needles:  Injection technique: Single-shot  Needle Type: Echogenic Stimulator Needle     Needle Length: 9cm  Needle Gauge: 21     Additional Needles:   Procedures:,,,, ultrasound used (permanent image in chart),,    Narrative:  Start time: 12/29/2023 7:18 AM End time: 12/29/2023 7:28 AM Injection made incrementally with aspirations every 5 mL.  Performed by: Personally  Anesthesiologist: Jake Mayers, MD

## 2023-12-30 DIAGNOSIS — S82841A Displaced bimalleolar fracture of right lower leg, initial encounter for closed fracture: Secondary | ICD-10-CM | POA: Diagnosis not present

## 2023-12-30 LAB — CBC
HCT: 24.2 % — ABNORMAL LOW (ref 36.0–46.0)
Hemoglobin: 7.6 g/dL — ABNORMAL LOW (ref 12.0–15.0)
MCH: 28.3 pg (ref 26.0–34.0)
MCHC: 31.4 g/dL (ref 30.0–36.0)
MCV: 90 fL (ref 80.0–100.0)
Platelets: 252 10*3/uL (ref 150–400)
RBC: 2.69 MIL/uL — ABNORMAL LOW (ref 3.87–5.11)
RDW: 14.3 % (ref 11.5–15.5)
WBC: 9.3 10*3/uL (ref 4.0–10.5)
nRBC: 0 % (ref 0.0–0.2)

## 2023-12-30 LAB — BASIC METABOLIC PANEL WITH GFR
Anion gap: 10 (ref 5–15)
BUN: 20 mg/dL (ref 8–23)
CO2: 23 mmol/L (ref 22–32)
Calcium: 8.3 mg/dL — ABNORMAL LOW (ref 8.9–10.3)
Chloride: 103 mmol/L (ref 98–111)
Creatinine, Ser: 0.95 mg/dL (ref 0.44–1.00)
GFR, Estimated: >60 mL/min (ref >60)
Glucose, Bld: 159 mg/dL — ABNORMAL HIGH (ref 70–99)
Potassium: 4 mmol/L (ref 3.5–5.1)
Sodium: 136 mmol/L (ref 135–145)

## 2023-12-30 LAB — PREPARE RBC (CROSSMATCH)

## 2023-12-30 LAB — IRON AND TIBC
Iron: 19 ug/dL — ABNORMAL LOW (ref 28–170)
Saturation Ratios: 10 % — ABNORMAL LOW (ref 10.4–31.8)
TIBC: 183 ug/dL — ABNORMAL LOW (ref 250–450)
UIBC: 164 ug/dL

## 2023-12-30 LAB — CALCIUM, IONIZED: Calcium, Ionized, Serum: 4.8 mg/dL (ref 4.5–5.6)

## 2023-12-30 MED ORDER — SODIUM CHLORIDE 0.9% IV SOLUTION: Freq: Once | INTRAVENOUS | Status: DC

## 2023-12-30 MED ORDER — ASPIRIN 81 MG PO TBEC
81.0000 mg | DELAYED_RELEASE_TABLET | Freq: Every day | ORAL | Status: DC
  Administered 2023-12-30 – 2024-01-02 (×4): 81 mg via ORAL
  Filled 2023-12-30 (×4): qty 1

## 2023-12-30 MED ORDER — DOCUSATE SODIUM 100 MG PO CAPS
100.0000 mg | ORAL_CAPSULE | Freq: Every day | ORAL | Status: DC | PRN
  Administered 2023-12-30 – 2023-12-31 (×2): 100 mg via ORAL
  Filled 2023-12-30 (×2): qty 1

## 2023-12-30 MED ORDER — ENOXAPARIN SODIUM 40 MG/0.4ML IJ SOSY
40.0000 mg | PREFILLED_SYRINGE | INTRAMUSCULAR | Status: DC
  Administered 2023-12-30 – 2024-01-02 (×4): 40 mg via SUBCUTANEOUS
  Filled 2023-12-30 (×4): qty 0.4

## 2023-12-30 NOTE — Progress Notes (Signed)
   12/30/23 2213  BiPAP/CPAP/SIPAP  Reason BIPAP/CPAP not in use Other(comment) (patient is not ready to put cpap on at this time.)

## 2023-12-30 NOTE — TOC Progression Note (Addendum)
 Transition of Care [facility]) - Progression Note    Patient Details  Name: [name] MRN: [id number] Date of Birth: 1951-06-22  [facility]) CM/SW Contact  Bari Leys, RN Phone Number: 12/30/2023, 1:44 PM  Clinical Narrative: PT eval completed, recommendation for short term rehab/SNF, pt previously confirmed agreeable to SNF, preference [facility], faxed out for bed offers. Text sent to [place], admit coord at [facility], bed offer extended.   -1:53am Call to patient to review PT recommendation , confirmed agreeable with preference for [facility], updated patient [name] has made bed offer, patient officially accepted. Pt reports she is currently receiving a blood transfusion. SNF auth not initiated at this time, SNF auth not initiated at this time, will continue to follow.       Expected Discharge Plan: Skilled Nursing Facility Barriers to Discharge: Continued Medical Work up  Expected Discharge Plan and Services In-house Referral: Clinical Social Work   Post Acute Care Choice: Skilled Nursing Facility Living arrangements for the past 2 months: Single Family Home                 DME Arranged: N/A DME Agency: NA                   Social Determinants of Health (SDOH) Interventions SDOH Screenings   Food Insecurity: No Food Insecurity (12/26/2023)  Housing: Low Risk  (12/26/2023)  Transportation Needs: No Transportation Needs (12/26/2023)  Utilities: Not At Risk (12/26/2023)  Alcohol  Screen: Low Risk  (07/29/2023)  Depression (PHQ2-9): Low Risk  (11/02/2023)  Financial Resource Strain: Low Risk  (07/29/2023)  Physical Activity: Inactive (07/29/2023)  Social Connections: Moderately Integrated (12/26/2023)  Stress: No Stress Concern Present (07/29/2023)  Tobacco Use: Low Risk  (12/26/2023)    Readmission Risk Interventions    12/27/2023   11:05 AM 12/13/2023    2:57 PM  Readmission Risk Prevention Plan  Transportation Screening Complete Complete  PCP  or Specialist Appt within 5-7 Days  Complete  PCP or Specialist Appt within 3-5 Days Complete   Home Care Screening  Complete  Medication Review (RN CM)  Complete  HRI or Home Care Consult Complete   Social Work Consult for Recovery Care Planning/Counseling Complete   Palliative Care Screening Not Applicable   Medication Review Oceanographer) Complete

## 2023-12-30 NOTE — Progress Notes (Signed)
 Subjective: 1 Day Post-Op Procedure(s) (LRB): OPEN REDUCTION INTERNAL FIXATION (ORIF) ANKLE FRACTURE (Right) Patient reports pain as moderate and severe.   Pain poorly controlled overnight after block wore off but doing a little better now  Objective: Vital signs in last 24 hours: Temp:  [97.5 F (36.4 C)-98.6 F (37 C)] 97.5 F (36.4 C) (05/18 0529) Pulse Rate:  [58-73] 65 (05/18 0529) Resp:  [15-19] 16 (05/18 0529) BP: (111-141)/(44-76) 141/72 (05/18 0529) SpO2:  [94 %-100 %] 100 % (05/18 0529)  Intake/Output from previous day: 05/17 0701 - 05/18 0700 In: 1380 [P.O.:580; I.V.:500; IV Piggyback:300] Out: 2140 [Urine:2125; Blood:15] Intake/Output this shift: Total I/O In: -  Out: 650 [Urine:650]  Recent Labs    12/28/23 0306 12/28/23 1308 12/29/23 0332 12/30/23 0302  HGB 7.8* 8.1* 7.7* 7.6*   Recent Labs    12/29/23 0332 12/30/23 0302  WBC 7.3 9.3  RBC 2.77* 2.69*  HCT 25.4* 24.2*  PLT 212 252   Recent Labs    12/29/23 0332 12/30/23 0302  NA 135 136  K 4.0 4.0  CL 103 103  CO2 24 23  BUN 11 20  CREATININE 1.18* 0.95  GLUCOSE 126* 159*  CALCIUM  8.1* 8.3*   No results for input(s): "LABPT", "INR" in the last 72 hours.  Neurologically intact Neurovascular intact Compartment soft Knee with no warmth or effusion   Assessment/Plan: 1 Day Post-Op Procedure(s) (LRB): OPEN REDUCTION INTERNAL FIXATION (ORIF) ANKLE FRACTURE (Right) Up with therapy non-weight bearing RLE      [name] 12/30/2023, 7:51 AM

## 2023-12-30 NOTE — Progress Notes (Signed)
 PROGRESS NOTE  [name]  ZOX:[id number] DOB: 06-03-1951 DOA: 12/26/2023 PCP: Laneta Pintos, MD  Consultants  Brief Narrative: 73 y.o. 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  Found to have trimalleolar ankle fracture with syndesmotic disruption.  Admitted for the same.   Assessment & Plan: Right-sided ankle fracture status post closed reduction Mechanical fall -X-ray showed trimalleolar fracture. Status post open reduction internal fixation 5/18. - Pain block wore off last night and then more pain today.  She is little frustrated because of inability to obtain regular IV pain medication. -Will need PT/OT and likely rehab at discharge that she cannot bear weight on that right foot.Aaron Aas   History of right-sided hip replacement 2022. 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 -Holding aspirin  81 mg twice daily as currently treating with heparin  for DVT prophylaxis.  Will resume aspirin  on discharge. -Bandage in place and clean dry intact.  Normocytic anemia: - transfusion threshold <8 in light of her heart failure diagnosis. - Transfuse x 1 today.  Will trend CBCs.   Chronic hypokalemia -Resolved, will follow   Acute kidney injury on CKD stage IIIa -Downtrending back to her baseline CKD stage IIIa.  Today GFR is  actually greater than 60 which is good news   History of PVC - Patient reported he is off amiodarone  due to recent diagnosis of pulmonary fibrosis and while he has been taking Toprol -XL. -Toprol -XL 12.5 mg daily   Chronic diastolic heart failure Essential hypertension - Continue Toprol -XL.  Holding Lasix  and spironolactone  in setting of AKI.  If renal function remains stable and improved will resume.   History of IBS -Continue hyoscyamine  as needed   Hyperlipidemia -Continue fenofibrate  and Crestor    History rheumatoid arthritis - Continue folic acid .   Peripheral neuropathy - Continue gabapentin  300 mg in the morning and 600 milligram in the nighttime.  OSA: - CPAP at night here       DVT prophylaxis:  enoxaparin  (LOVENOX ) injection 40 mg Start: 12/30/23 1530 SCDs Start: 12/26/23 0630 Place TED hose Start: 12/26/23 0630  Code Status:   Code Status: Full Code Level of care: Telemetry Status is: Inpatient   Consults called: Orthopedics  Subjective: Patient is a little more pain today after her pain block wore off.  She expresses some frustration about not knowing how much pain medication she supposed to be getting.  Otherwise no concerns today.  Objective: Vitals:   12/30/23 0119 12/30/23 0529 12/30/23 0902 12/30/23 1357  BP: 111/60 (!) 141/72 (!) 132/58 (!) 126/55  Pulse: 66 65 67 69  Resp: 16 16 18 16   Temp: 98.6 F (37 C) (!) 97.5 F (36.4 C) 98.2 F (36.8 C) 97.7 F (36.5 C)  TempSrc: Oral Oral Oral Oral  SpO2: 100% 100% 100% 96%  Weight:      Height:        Intake/Output Summary (Last 24 hours) at 12/30/2023 1439 Last data filed at 12/30/2023 1400 Gross per  24 hour  Intake 1420 ml  Output 2650 ml  Net -1230 ml   Filed Weights   12/26/23 0105  Weight: 97.5 kg   Body mass index is 39.97 kg/m.  Gen: 73 y.o. female in no apparent distress.  Nontoxic Pulm: Non-labored breathing.  Clear to auscultation bilaterally.  CV: Regular rate and rhythm.  No murmur, rub, or gallop. No JVD GI: Abdomen soft, non-tender, non-distended Ext: Warm, no deformities.  Right knee with joint tenderness and some mild swelling that has improved.  Bandage overlying patella clean dry and intact.  Right cast in place.  Able to move toes with good cap refill Skin: No rashes, lesions  Neuro: Alert and oriented. No focal neurological deficits. Psych: Calm  Judgement and insight appear normal. Mood & affect appropriate.     I have personally reviewed the following labs and images: CBC: Recent Labs  Lab 12/26/23 0153 12/27/23 0335 12/28/23 0306 12/28/23 1308 12/29/23 0332 12/30/23 0302  WBC 16.6* 8.2 8.9 7.3 7.3 9.3  NEUTROABS 14.6*  --   --   --   --   --   HGB 11.0* 8.5* 7.8* 8.1* 7.7* 7.6*  HCT 36.4 27.2* 25.4* 26.3* 25.4* 24.2*  MCV 91.0 90.4 91.7 91.6 91.7 90.0  PLT 277 193 193 188 212 252   BMP &GFR Recent Labs  Lab 12/26/23 0153 12/27/23 0335 12/28/23 0306 12/29/23 0332 12/30/23 0302  NA 137 135 133* 135 136  K 3.2* 3.6 3.8 4.0 4.0  CL 103 104 101 103 103  CO2 22 24 23 24 23   GLUCOSE 124* 101* 112* 126* 159*  BUN 21 16 13 11 20   CREATININE 1.37* 1.18* 1.03* 1.18* 0.95  CALCIUM  8.8* 8.2* 7.2* 8.1* 8.3*   Estimated Creatinine Clearance: 58.7 mL/min (by C-G formula based on SCr of 0.95 mg/dL). Liver & Pancreas: No results for input(s): "AST", "ALT", "ALKPHOS", "BILITOT", "PROT", "ALBUMIN" in the last 168 hours. No results for input(s): "LIPASE", "AMYLASE" in the last 168 hours. No results for input(s): "AMMONIA" in the last 168 hours. Diabetic: No results for input(s): "HGBA1C" in the last 72 hours. No results for input(s): "GLUCAP" in the last 168 hours. Cardiac Enzymes: No results for input(s): "CKTOTAL", "CKMB", "CKMBINDEX", "TROPONINI" in the last 168 hours. No results for input(s): "PROBNP" in the last 8760 hours. Coagulation Profile: No results for input(s): "INR", "PROTIME" in the last 168 hours. Thyroid  Function  Tests: No results for input(s): "TSH", "T4TOTAL", "FREET4", "T3FREE", "THYROIDAB" in the last 72 hours. Lipid Profile: No results for input(s): "CHOL", "HDL", "LDLCALC", "TRIG", "CHOLHDL", "LDLDIRECT" in the last 72 hours. Anemia Panel: Recent Labs    12/30/23 0302  TIBC 183*  IRON  19*   Urine analysis:    Component Value Date/Time   COLORURINE YELLOW 01/19/2022 1443   APPEARANCEUR CLEAR 01/19/2022 1443   LABSPEC 1.008 01/19/2022 1443   PHURINE < OR = 5.0 01/19/2022 1443   GLUCOSEU NEGATIVE 01/19/2022 1443   HGBUR NEGATIVE 01/19/2022 1443   BILIRUBINUR negative 07/28/2020 1351   KETONESUR NEGATIVE 01/19/2022 1443   PROTEINUR NEGATIVE 01/19/2022 1443   UROBILINOGEN 0.2 07/28/2020 1351   UROBILINOGEN 0.2 06/13/2011 1230   NITRITE NEGATIVE 01/19/2022 1443   LEUKOCYTESUR TRACE (A) 01/19/2022 1443   Sepsis Labs: Invalid input(s): "PROCALCITONIN", "LACTICIDVEN"  Microbiology: Recent Results (from the past 240 hours)  Surgical PCR screen     Status: Abnormal   Collection Time: 12/29/23 12:27 AM   Specimen: Nasal Mucosa; Nasal Swab  Result Value Ref Range  Status   MRSA, PCR POSITIVE (A) NEGATIVE Final    Comment: RESULT CALLED TO, READ BACK BY AND VERIFIED WITH: PRESS, A. RN AT 12/29/2023 0231 BY JEREMY C    Staphylococcus aureus POSITIVE (A) NEGATIVE Final    Comment: (NOTE) The Xpert SA Assay (FDA approved for NASAL specimens in patients 70 years of age and older), is one component of a comprehensive surveillance program. It is not intended to diagnose infection nor to guide or monitor treatment. Performed at [facility], [address] [place], [place] [address]     Radiology Studies: No results found.    Scheduled Meds:  sodium chloride    Intravenous Once   acetaminophen   650 mg Oral TID   aspirin  EC  81 mg Oral Daily   Chlorhexidine  Gluconate Cloth  6 each Topical Daily   enoxaparin  (LOVENOX ) injection  40 mg Subcutaneous Q24H    fenofibrate   54 mg Oral Daily   folic acid   1 mg Oral Daily   gabapentin   300 mg Oral Daily   gabapentin   600 mg Oral QHS   metoprolol  succinate  12.5 mg Oral Daily   mupirocin  ointment  1 Application Nasal BID   pantoprazole   40 mg Oral Daily   potassium chloride   20 mEq Oral Daily   rosuvastatin   10 mg Oral Daily   sodium chloride  flush  10-40 mL Intracatheter Q12H   sodium chloride  flush  3 mL Intravenous Q12H   sodium chloride  flush  3 mL Intravenous Q12H   Continuous Infusions:  ampicillin  (OMNIPEN) IV 2 g (12/30/23 1139)     LOS: 4 days   35 minutes with more than 50% spent in reviewing records, counseling patient/family and coordinating care.  Trenton Frock, MD [facility] [email or web address] 12/30/2023, 2:39 PM

## 2023-12-30 NOTE — Evaluation (Signed)
 Occupational Therapy Evaluation Patient Details Name: [name] MRN: [id number] DOB: [birth year] Today's Date: 12/30/2023   History of Present Illness   [name] is a 73 y.o. 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     Clinical Impressions Pt admitted with the above diagnoses and presents with below problem list. Pt will benefit from continued acute OT to address the below listed deficits and maximize independence with basic ADLs prior to d/c. At baseline, pt endorses needing assist with bathing. Pt currently need significant assist with ADLs. Pt able to come to EOB and sit for several minutes with constant support provided to keep RLE elevated. Unable to progress to standing today d/t pain; premedicated. Son present during evaluation.        If plan is discharge home, recommend the following:         Functional Status Assessment   Patient has had a recent decline in their functional status and demonstrates the ability to make significant improvements in function in a reasonable and predictable amount of time.     Equipment Recommendations   Other (comment) (defer to next  venue)     Recommendations for Other Services         Precautions/Restrictions   Precautions Precautions: Fall Recall of Precautions/Restrictions: Intact Required Braces or Orthoses: Splint/Cast Splint/Cast: R ankle Restrictions Weight Bearing  Restrictions Per Provider Order: Yes RLE Weight Bearing Per Provider Order: Non weight bearing     Mobility Bed Mobility Overal bed mobility: Needs Assistance Bed Mobility: Sit to Supine, Supine to Sit     Supine to sit: HOB elevated, Used rails, Mod assist Sit to supine: HOB elevated, Used rails, Min assist   General bed mobility comments: assist with RLE for bed entry/exit, assist with scooting up in bed    Transfers Overall transfer level: Needs assistance                 General transfer comment: unable to progress past sitting EOB      Balance Overall balance assessment: Needs assistance Sitting-balance support: Feet supported, No upper extremity supported Sitting balance-Leahy Scale: Fair Sitting balance - Comments: requires consistent support to RLE                                   ADL either performed or assessed with clinical judgement   ADL Overall ADL's : Needs assistance/impaired Eating/Feeding: Independent;Bed level   Grooming: Set up;Bed level   Upper Body Bathing: Minimal assistance;Sitting;Bed level   Lower Body Bathing: Maximal assistance;Bed level   Upper Body Dressing : Minimal assistance;Sitting;Bed level   Lower Body Dressing: Maximal assistance;Bed level                 General ADL Comments: Will need +2 to attempt standing. may benefit from [facility] walker for standing trial?     Vision         Perception         Praxis  Pertinent Vitals/Pain Pain Assessment Pain Assessment: 0-10 Pain Score: 4  Pain Location: R knee and ankle Pain Descriptors / Indicators: Aching, Constant, Discomfort, Operative site guarding, Squeezing, Throbbing, Jabbing Pain Intervention(s): Premedicated before session, Monitored during session, Limited activity within patient's tolerance, Repositioned     Extremity/Trunk Assessment Upper Extremity Assessment Upper Extremity Assessment: Generalized weakness;Overall [facility] for  tasks assessed   Lower Extremity Assessment Lower Extremity Assessment: Defer to PT evaluation RLE Deficits / Details: recent hx of R TKA 12/12/23, trimall fx 12/26/23 RLE: Unable to fully assess due to pain   Cervical / Trunk Assessment Cervical / Trunk Assessment: Kyphotic   Communication Communication Communication: No apparent difficulties   Cognition Arousal: Alert Behavior During Therapy: WFL for tasks assessed/performed, Anxious Cognition: No apparent impairments                               Following commands: Intact       Cueing  General Comments   Cueing Techniques: Verbal cues;Visual cues  R knee edema present-see imaging-no fx noted   Exercises     Shoulder Instructions      Home Living Family/patient expects to be discharged to:: Private residence Living Arrangements: Alone Available Help at Discharge: Available 24 hours/day;Family (son nearby) Type of Home: House Home Access: Ramped entrance     Home Layout: One level     Bathroom Shower/Tub: Producer, television/film/video: Standard     Home Equipment: Rollator (4 wheels);Cane - single point;Shower seat;Hospital bed          Prior Functioning/Environment Prior Level of Function : History of Falls (last six months);Needs assist       Physical Assist : Mobility (physical)       ADLs Comments: assist with bathing    OT Problem List: Decreased strength;Decreased activity tolerance;Impaired balance (sitting and/or standing);Pain;Decreased knowledge of precautions;Decreased knowledge of use of DME or AE;Decreased safety awareness;Decreased range of motion   OT Treatment/Interventions: Self-care/ADL training;Therapeutic exercise;DME and/or AE instruction;Therapeutic activities;Patient/family education;Balance training      OT Goals(Current goals can be found in the care plan section)   Acute Rehab OT Goals Patient Stated Goal: pain management OT Goal Formulation: With  patient/family Time For Goal Achievement: 01/13/24 Potential to Achieve Goals: Good ADL Goals Pt Will Perform Grooming: with supervision;sitting Pt Will Transfer to Toilet: with mod assist;with +2 assist;squat pivot transfer;bedside commode Pt/caregiver will Perform Home Exercise Program: Increased strength;Both right and left upper extremity;With theraband;With written HEP provided;Independently Additional ADL Goal #1: Pt will complete bed mobility at min A level to prepare for EOB/OOB ADLs.   OT Frequency:  Min 3X/week    Co-evaluation PT/OT/SLP Co-Evaluation/Treatment: Yes Reason for Co-Treatment: Complexity of the patient's impairments (multi-system involvement);For patient/therapist safety PT goals addressed during session: Mobility/safety with mobility;Balance OT goals addressed during session: ADL's and self-care;Proper use of Adaptive equipment and DME      AM-PAC OT "6 Clicks" Daily Activity     Outcome Measure Help from another person eating meals?: None Help from another person taking care of personal grooming?: None Help from another person toileting, which includes using toliet, bedpan, or urinal?: Total Help from another person bathing (including washing, rinsing, drying)?: A Lot Help from another person to put on and taking off regular upper body clothing?: A Little Help from another person to put on and taking off regular lower body clothing?: Total 6 Click Score: 15   End of  Session Nurse Communication: Patient requests pain meds;Mobility status;Precautions;Weight bearing status  Activity Tolerance: Patient limited by pain Patient left: in bed;with call bell/phone within reach;with family/visitor present  OT Visit Diagnosis: Unsteadiness on feet (R26.81);History of falling (Z91.81);Muscle weakness (generalized) (M62.81);Pain                Time: 4098-1191 OT Time Calculation (min): 34 min Charges:  OT General Charges $OT Visit: 1 Visit OT Evaluation $OT Eval  Moderate Complexity: 1 Mod  Lael Pierce, OT Acute Rehabilitation Services Office: [phone]   Brinton Canavan 12/30/2023, 1:33 PM

## 2023-12-30 NOTE — Plan of Care (Signed)

## 2023-12-30 NOTE — Evaluation (Signed)
 Physical Therapy Evaluation Patient Details Name: [name] MRN: [id number] DOB: 1950/10/18 Today's Date: 12/30/2023  History of Present Illness  [name] is a 73 y.o. 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  Clinical Impression  Pt admitted with above diagnosis 12/26/23. Pt recently underwent R TKA 12/12/23, had a mechanical fall at home resulting in R ankle fx, ORIF 12/29/23. Pt anxious about session this morning due to pain, premedicated in optimal window for this session. Pt requires mod A to sit EOB and requires repositioning and acoomodation of the RLE in this postion. Pt tends to have a slight dec in pain with small degree of hip flexion. Returns to supine and bed placed in chair position with RLE elevated. Continue to address R knee impairments as well as mobility to mitigate risk of further complications as she is still in the early healing stages of TKA. Pt currently with functional limitations due to the deficits listed below (see PT Problem List). Pt will benefit from acute skilled PT to increase their independence and safety with mobility to allow discharge.           If plan is discharge home, recommend the following: Assistance with cooking/housework;Assist for transportation;Help with stairs or ramp for entrance;A lot of help with walking and/or transfers;Two people to help with walking and/or transfers;A lot of help with  bathing/dressing/bathroom;Two people to help with bathing/dressing/bathroom   Can travel by private vehicle   No    Equipment Recommendations None recommended by PT  Recommendations for Other Services       Functional Status Assessment Patient has had a recent decline in their functional status and demonstrates the ability to make significant improvements in function in a reasonable and predictable amount of time.     Precautions / Restrictions Precautions Precautions: Fall Recall of Precautions/Restrictions: Intact Required Braces or Orthoses: Splint/Cast Splint/Cast: R ankle Restrictions Weight Bearing Restrictions Per Provider Order: Yes RLE Weight Bearing Per Provider Order: Non weight bearing      Mobility  Bed Mobility Overal bed mobility: Needs Assistance Bed Mobility: Sit to Supine, Supine to Sit     Supine to sit: HOB elevated, Used rails, Mod assist Sit to supine: HOB elevated, Used rails, Min assist   General bed mobility comments: assist with RLE for bed entry/exit, assist with scooting up in bed    Transfers Overall transfer level: Needs assistance Equipment used: Rolling walker (2 wheels)               General transfer comment: unable to progress past sitting EOB    Ambulation/Gait                  Stairs            Wheelchair Mobility     Tilt Bed    Modified Rankin (Stroke Patients Only)       Balance Overall balance assessment: Needs assistance Sitting-balance support: Feet supported, No upper extremity supported Sitting balance-Leahy Scale: Fair Sitting balance - Comments: requires consistent support to RLE  Pertinent Vitals/Pain Pain Assessment Pain Assessment: Faces Faces Pain Scale: Hurts whole lot Pain Location: R knee and ankle Pain Descriptors / Indicators: Aching, Constant, Discomfort, Operative site guarding, Squeezing, Throbbing, Jabbing Pain  Intervention(s): Limited activity within patient's tolerance, Monitored during session, Repositioned, Premedicated before session    Home Living Family/patient expects to be discharged to:: Private residence Living Arrangements: Alone Available Help at Discharge: Available 24 hours/day;Family (son nearby) Type of Home: House Home Access: Ramped entrance       Home Layout: One level Home Equipment: Rollator (4 wheels);Cane - single point;Shower seat;Hospital bed      Prior Function Prior Level of Function : History of Falls (last six months);Needs assist       Physical Assist : Mobility (physical)       ADLs Comments: assist with bathing     Extremity/Trunk Assessment   Upper Extremity Assessment Upper Extremity Assessment: Defer to OT evaluation    Lower Extremity Assessment Lower Extremity Assessment: RLE deficits/detail RLE Deficits / Details: recent hx of R TKA 12/12/23, trimall fx 12/26/23 RLE: Unable to fully assess due to pain    Cervical / Trunk Assessment Cervical / Trunk Assessment: Kyphotic  Communication   Communication Communication: No apparent difficulties    Cognition Arousal: Alert Behavior During Therapy: WFL for tasks assessed/performed, Anxious   PT - Cognitive impairments: No apparent impairments                         Following commands: Intact       Cueing Cueing Techniques: Verbal cues, Visual cues     General Comments General comments (skin integrity, edema, etc.): R knee edema present-see imaging-no fx noted    Exercises     Assessment/Plan    PT Assessment Patient needs continued PT services  PT Problem List Decreased strength;Decreased activity tolerance;Decreased mobility;Decreased range of motion;Decreased balance;Pain       PT Treatment Interventions DME instruction;Functional mobility training;Balance training;Patient/family education;Gait training;Therapeutic activities;Therapeutic exercise    PT Goals  (Current goals can be found in the Care Plan section)  Acute Rehab PT Goals Patient Stated Goal: decrease pain Time For Goal Achievement: 01/13/24 Potential to Achieve Goals: Good    Frequency Min 3X/week     Co-evaluation PT/OT/SLP Co-Evaluation/Treatment: Yes Reason for Co-Treatment: Complexity of the patient's impairments (multi-system involvement);For patient/therapist safety PT goals addressed during session: Mobility/safety with mobility;Balance OT goals addressed during session: ADL's and self-care;Proper use of Adaptive equipment and DME       AM-PAC PT "6 Clicks" Mobility  Outcome Measure Help needed turning from your back to your side while in a flat bed without using bedrails?: A Lot Help needed moving from lying on your back to sitting on the side of a flat bed without using bedrails?: A Lot Help needed moving to and from a bed to a chair (including a wheelchair)?: Total Help needed standing up from a chair using your arms (e.g., wheelchair or bedside chair)?: Total Help needed to walk in hospital room?: Total Help needed climbing 3-5 steps with a railing? : Total 6 Click Score: 8    End of Session   Activity Tolerance: Patient tolerated treatment well Patient left: in bed;with call bell/phone within reach;with family/visitor present;with bed alarm set Nurse Communication: Mobility status PT Visit Diagnosis: Difficulty in walking, not elsewhere classified (R26.2);Pain;Unsteadiness on feet (R26.81) Pain - Right/Left: Right Pain - part of body: Knee;Ankle and joints of foot;Leg    Time: 1610-9604 PT  Time Calculation (min) (ACUTE ONLY): 34 min   Charges:   PT Evaluation $PT Eval Moderate Complexity: 1 Mod PT Treatments $Therapeutic Activity: 8-22 mins PT General Charges $$ ACUTE PT VISIT: 1 Visit         Darien Eden, PT Acute Rehabilitation Services Office: [phone] 12/30/2023   Serafin Dames 12/30/2023, 1:23 PM

## 2023-12-31 ENCOUNTER — Ambulatory Visit: Admitting: Internal Medicine

## 2023-12-31 ENCOUNTER — Encounter (HOSPITAL_COMMUNITY): Payer: Self-pay | Admitting: Orthopaedic Surgery

## 2023-12-31 DIAGNOSIS — S82841A Displaced bimalleolar fracture of right lower leg, initial encounter for closed fracture: Secondary | ICD-10-CM | POA: Diagnosis not present

## 2023-12-31 LAB — CBC
HCT: 30.1 % — ABNORMAL LOW (ref 36.0–46.0)
Hemoglobin: 9.3 g/dL — ABNORMAL LOW (ref 12.0–15.0)
MCH: 28.4 pg (ref 26.0–34.0)
MCHC: 30.9 g/dL (ref 30.0–36.0)
MCV: 92 fL (ref 80.0–100.0)
Platelets: 282 10*3/uL (ref 150–400)
RBC: 3.27 MIL/uL — ABNORMAL LOW (ref 3.87–5.11)
RDW: 14.4 % (ref 11.5–15.5)
WBC: 9.5 10*3/uL (ref 4.0–10.5)
nRBC: 0 % (ref 0.0–0.2)

## 2023-12-31 LAB — BASIC METABOLIC PANEL WITH GFR
Anion gap: 8 (ref 5–15)
BUN: 20 mg/dL (ref 8–23)
CO2: 25 mmol/L (ref 22–32)
Calcium: 8.3 mg/dL — ABNORMAL LOW (ref 8.9–10.3)
Chloride: 102 mmol/L (ref 98–111)
Creatinine, Ser: 1.19 mg/dL — ABNORMAL HIGH (ref 0.44–1.00)
GFR, Estimated: 49 mL/min — ABNORMAL LOW (ref >60)
Glucose, Bld: 112 mg/dL — ABNORMAL HIGH (ref 70–99)
Potassium: 4.3 mmol/L (ref 3.5–5.1)
Sodium: 135 mmol/L (ref 135–145)

## 2023-12-31 LAB — BPAM RBC
Blood Product Expiration Date: 202506082359
ISSUE DATE / TIME: 202505182325
Unit Type and Rh: 6200

## 2023-12-31 LAB — TYPE AND SCREEN
ABO/RH(D): A POS
Antibody Screen: NEGATIVE
Unit division: 0

## 2023-12-31 MED ORDER — POLYETHYLENE GLYCOL 3350 17 G PO PACK
17.0000 g | PACK | Freq: Every day | ORAL | Status: DC
  Administered 2023-12-31: 17 g via ORAL
  Filled 2023-12-31 (×3): qty 1

## 2023-12-31 MED ORDER — SENNA 8.6 MG PO TABS
1.0000 | ORAL_TABLET | Freq: Every day | ORAL | Status: DC
  Administered 2023-12-31 – 2024-01-01 (×2): 8.6 mg via ORAL
  Filled 2023-12-31 (×2): qty 1

## 2023-12-31 MED ORDER — FERROUS SULFATE 325 (65 FE) MG PO TABS
325.0000 mg | ORAL_TABLET | ORAL | Status: DC
  Administered 2023-12-31 – 2024-01-02 (×2): 325 mg via ORAL
  Filled 2023-12-31 (×2): qty 1

## 2023-12-31 NOTE — Progress Notes (Addendum)
  Progress Note   Patient: [name] ZOX:[id number] DOB: 1950-11-12 DOA: 12/26/2023     5 DOS: the patient was seen and examined on 12/31/2023 at 8:20AM      Brief hospital course: 73 y.o. F with recent prosthetic R knee infection on ampicillin , hx RA on Arava /HCQ, fibromyalgia/neuropathy, CKD IIIb baseline 1.2-1.4, eosinophilic esophagitis, frequent PVCs no longer on amiodarone  due to concern for amio-lung toxicity, OSA, HTN, dCHF, and MO who presented with fall and right ankle fracture.  S/p ORIF by Dr. Cherl Corner on 5/17     Assessment and Plan: Right trimalleolar ankle fracture S/p ORIF by Dr. Cherl Corner 5/17 -Postop care per orthopedics   Right knee prosthetic infection -Continue ampicillin   Rheumatoid arthritis Arava  on hold due to a pressure ulcer last fall.  Given her worsening renal function and concerns for immunosuppression and her joint infection, this remains on hold.  Patient reports hydroxychloroquine  also on hold, although last Rheum note 3/27 states both "She has been unsure of how much clinical benefit she has noticed since resuming Plaquenil " but also "For now she will remain on Plaquenil  as prescribed " and I see no other documentation that it was stopped later. - Follow up with Rheum after discharge regarding HCQ  Chronic kidney disease stage IIIb Cr stable relative to baseline.  Morbid obesity Class III obesity BMI 40 with comorbid HTN, sleep apnea, hyperlipidemia.  Fibromyalgia Polyneuropathy -Continue gabapentin   OSA - CPAP at night  Hypertension Chronic diastolic congestive heart failure Hyperlipidemia - Continue fenofibrate , Crestor  - Hold colestipol  - Hold spironolactone  and torsemide  today  Frequent PVCs Amiodarone  recently stopped due to concern for lung toxicity - Hold amiodarone  - Continue metoprolol   Eosinophilic esophagitis - Continue pantoprazole   Normocytic anemia due to blood loss, likely postoperative Iron  stores  low Transfused 2 units 5/18, hemoglobin up to 9.3 today - Start oral iron       Subjective: Appears comfortable but reports severe pain in right leg with any movement.  Unable to even dangle the leg of the bed today and yeserday.  No fever, no vomiting, no respiratory symptoms, no confusion.     Physical Exam: BP (!) 117/55 (BP Location: Left Arm)   Pulse (!) 56   Temp 97.7 F (36.5 C) (Oral)   Resp 16   Ht 5' 1.5" (1.562 m)   Wt 97.5 kg   SpO2 98%   BMI 39.97 kg/m   Obese adult female, sitting up in bed, interactive and appropriate RRR, no murmurs, no pitting edema in the extremities, JVP not visible due to body habitus Respiratory rate normal, lungs diminished but no rales or wheezes appreciated Abdomen soft, no tenderness palpation Attention normal, affect normal, upper extremity strength symmetric, face symmetric, speech fluent, oriented to person, place, and time    Data Reviewed: Outside records reviewed Basic metabolic panel unremarkable Iron  studies show iron  deficiency Hemoglobin improved to 9.3 after transfusions, white count normal    Family Communication:     Disposition: Status is: Inpatient Unable to progress due to frequent dosing of IV opiates due to uncontrolled pain.        Author: Ephriam Hashimoto, MD 12/31/2023 11:27 AM  For on call review [email or web address].

## 2023-12-31 NOTE — TOC Progression Note (Signed)
 Transition of Care [facility]) - Progression Note    Patient Details  Name: [name] MRN: [id number] Date of Birth: Nov 24, 1950  Transition of [facility]) CM/SW Contact  Delilah Fend, LCSW Phone Number: 12/31/2023, 1:36 PM  Clinical Narrative:     Pt not yet medically ready for dc to [facility].  Facility and pt aware.  Insurance authorization begun and pending.   Expected Discharge Plan: Skilled Nursing Facility Barriers to Discharge: Continued Medical Work up  Expected Discharge Plan and Services In-house Referral: Clinical Social Work   Post Acute Care Choice: Skilled Nursing Facility Living arrangements for the past 2 months: Single Family Home                 DME Arranged: N/A DME Agency: NA                   Social Determinants of Health (SDOH) Interventions SDOH Screenings   Food Insecurity: No Food Insecurity (12/26/2023)  Housing: Low Risk  (12/26/2023)  Transportation Needs: No Transportation Needs (12/26/2023)  Utilities: Not At Risk (12/26/2023)  Alcohol  Screen: Low Risk  (07/29/2023)  Depression (PHQ2-9): Low Risk  (11/02/2023)  Financial Resource Strain: Low Risk  (07/29/2023)  Physical Activity: Inactive (07/29/2023)  Social Connections: Moderately Integrated (12/26/2023)  Stress: No Stress Concern Present (07/29/2023)  Tobacco Use: Low Risk  (12/26/2023)    Readmission Risk Interventions    12/27/2023   11:05 AM 12/13/2023    2:57 PM  Readmission Risk Prevention Plan  Transportation Screening Complete Complete  PCP or Specialist Appt within 5-7 Days  Complete  PCP or Specialist Appt within 3-5 Days Complete   Home Care Screening  Complete  Medication Review (RN CM)  Complete  HRI or Home Care Consult Complete   Social Work Consult for Recovery Care Planning/Counseling Complete   Palliative Care Screening Not Applicable   Medication Review Oceanographer) Complete

## 2023-12-31 NOTE — Progress Notes (Signed)
 Physical Therapy Treatment Patient Details Name: [name] MRN: [id number] DOB: 1951/04/21 Today's Date: 12/31/2023   History of Present Illness [name] is a 73 y.o. 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    PT Comments  Pt in bed, agreeable to PT, reports improved pain today to 4/10, and is able to come to sit EOB at [facility] with HOB slightly elevated and uses rails. Pt requires slight assistance to attain a full upright position, requires RLE assist for bed mobility. Pt able to complete sitting balance with improved tolerance and dec pain, knee also improved today without wincing pain as seen yesterday. Tolerates gross knee flexion and extension sitting EOB. Pt reports inc fatigue today, unable to progress further mobility.     If plan is discharge home, recommend the following: Assistance with cooking/housework;Assist for transportation;Help with stairs or ramp for entrance;A lot of help with walking and/or transfers;Two people to help with walking and/or transfers;A lot of help with bathing/dressing/bathroom;Two people to help with bathing/dressing/bathroom   Can travel by private vehicle     No  Equipment Recommendations  None recommended by PT    Recommendations for Other Services       Precautions / Restrictions Precautions Precautions: Fall Recall of Precautions/Restrictions: Intact Required Braces or Orthoses:  Splint/Cast Splint/Cast: R ankle Restrictions Weight Bearing Restrictions Per Provider Order: Yes RLE Weight Bearing Per Provider Order: Non weight bearing     Mobility  Bed Mobility Overal bed mobility: Needs Assistance Bed Mobility: Sit to Supine, Supine to Sit     Supine to sit: HOB elevated, Used rails, Contact guard Sit to supine: HOB elevated, Used rails, Min assist   General bed mobility comments: assist with RLE for bed entry/exit, assist with scooting up in bed, able to better tolerate coming to sit EOB today vs yesterday    Transfers                        Ambulation/Gait                   Stairs             Wheelchair Mobility     Tilt Bed    Modified Rankin (Stroke Patients Only)       Balance Overall balance assessment: Needs assistance Sitting-balance support: Feet supported, No upper extremity supported Sitting balance-Leahy Scale: Fair Sitting balance - Comments: requires consistent support to RLE                                    Communication Communication Communication: No apparent difficulties  Cognition Arousal: Alert Behavior During Therapy: WFL for tasks assessed/performed, Anxious   PT - Cognitive impairments: No apparent impairments                         Following commands: Intact      Cueing Cueing Techniques: Verbal cues  Exercises  General Comments General comments (skin integrity, edema, etc.): R knee edema improving      Pertinent Vitals/Pain Pain Assessment Pain Assessment: 0-10 Pain Score: 4  Pain Location: R ankle Pain Descriptors / Indicators: Aching, Constant, Operative site guarding, Squeezing, Throbbing Pain Intervention(s): Monitored during session, Limited activity within patient's tolerance    Home Living                          Prior Function            PT Goals (current goals can now be found in the care plan section) Acute Rehab  PT Goals Patient Stated Goal: decrease pain PT Goal Formulation: With patient Time For Goal Achievement: 01/13/24 Potential to Achieve Goals: Good Progress towards PT goals: Progressing toward goals    Frequency    Min 3X/week      PT Plan      Co-evaluation              AM-PAC PT "6 Clicks" Mobility   Outcome Measure  Help needed turning from your back to your side while in a flat bed without using bedrails?: A Lot Help needed moving from lying on your back to sitting on the side of a flat bed without using bedrails?: A Lot Help needed moving to and from a bed to a chair (including a wheelchair)?: Total Help needed standing up from a chair using your arms (e.g., wheelchair or bedside chair)?: Total Help needed to walk in hospital room?: Total Help needed climbing 3-5 steps with a railing? : Total 6 Click Score: 8    End of Session Equipment Utilized During Treatment: Gait belt Activity Tolerance: Patient tolerated treatment well;Patient limited by fatigue Patient left: in bed;with call bell/phone within reach;with family/visitor present;with bed alarm set Nurse Communication: Mobility status PT Visit Diagnosis: Difficulty in walking, not elsewhere classified (R26.2);Pain;Unsteadiness on feet (R26.81) Pain - Right/Left: Right Pain - part of body: Ankle and joints of foot     Time: 7829-5621 PT Time Calculation (min) (ACUTE ONLY): 17 min  Charges:    $Therapeutic Activity: 8-22 mins PT General Charges $$ ACUTE PT VISIT: 1 Visit                     Darien Eden, PT Acute Rehabilitation Services Office: [phone] 12/31/2023    Serafin Dames 12/31/2023, 3:37 PM

## 2023-12-31 NOTE — Hospital Course (Addendum)
 73 year old woman PMH including prosthetic joint infection right knee on IV antibiotics, fell sustaining right ankle fracture.  Seen by orthopedics and underwent operative fixation.  Plan for transfer to SNF.  [facility] Orthoped[facility]   Procedures/Events 5/17 ORIF right trimalleolar ankle fracture, syndesmosis

## 2023-12-31 NOTE — Progress Notes (Signed)
   12/31/23 2255  BiPAP/CPAP/SIPAP  BiPAP/CPAP/SIPAP Pt Type Adult  BiPAP/CPAP/SIPAP Resmed  Mask Type Full face mask  Dentures removed?  (already out)  Mask Size Medium  FiO2 (%) 21 %  Patient Home Machine No  Patient Home Mask Yes  Patient Home Tubing Yes  Auto Titrate Yes  Minimum cmH2O 5 cmH2O  Maximum cmH2O 20 cmH2O  Device Plugged into [employer] Power Outlet Yes

## 2024-01-01 DIAGNOSIS — E66813 Obesity, class 3: Secondary | ICD-10-CM | POA: Insufficient documentation

## 2024-01-01 DIAGNOSIS — S82841A Displaced bimalleolar fracture of right lower leg, initial encounter for closed fracture: Secondary | ICD-10-CM | POA: Diagnosis not present

## 2024-01-01 LAB — COMPREHENSIVE METABOLIC PANEL WITH GFR
ALT: 9 U/L (ref 0–44)
AST: 17 U/L (ref 15–41)
Albumin: 2.3 g/dL — ABNORMAL LOW (ref 3.5–5.0)
Alkaline Phosphatase: 75 U/L (ref 38–126)
Anion gap: 10 (ref 5–15)
BUN: 26 mg/dL — ABNORMAL HIGH (ref 8–23)
CO2: 24 mmol/L (ref 22–32)
Calcium: 8.4 mg/dL — ABNORMAL LOW (ref 8.9–10.3)
Chloride: 101 mmol/L (ref 98–111)
Creatinine, Ser: 1.2 mg/dL — ABNORMAL HIGH (ref 0.44–1.00)
GFR, Estimated: 48 mL/min — ABNORMAL LOW (ref >60)
Glucose, Bld: 146 mg/dL — ABNORMAL HIGH (ref 70–99)
Potassium: 4.1 mmol/L (ref 3.5–5.1)
Sodium: 135 mmol/L (ref 135–145)
Total Bilirubin: 0.6 mg/dL (ref 0.0–1.2)
Total Protein: 6.3 g/dL — ABNORMAL LOW (ref 6.5–8.1)

## 2024-01-01 LAB — CBC
HCT: 30.3 % — ABNORMAL LOW (ref 36.0–46.0)
Hemoglobin: 9.3 g/dL — ABNORMAL LOW (ref 12.0–15.0)
MCH: 28 pg (ref 26.0–34.0)
MCHC: 30.7 g/dL (ref 30.0–36.0)
MCV: 91.3 fL (ref 80.0–100.0)
Platelets: 301 10*3/uL (ref 150–400)
RBC: 3.32 MIL/uL — ABNORMAL LOW (ref 3.87–5.11)
RDW: 14.9 % (ref 11.5–15.5)
WBC: 8.5 10*3/uL (ref 4.0–10.5)
nRBC: 0 % (ref 0.0–0.2)

## 2024-01-01 NOTE — Assessment & Plan Note (Addendum)
 Fibromyalgia - Continue gabapentin 

## 2024-01-01 NOTE — Assessment & Plan Note (Addendum)
 Had developed progressive right knee pain over preceding months.  Admitted for elective knee I&D with poly-exchange by Dr. France Ina on 4/30, culture grew E faecalis and discharged on ampicillin  via PICC on 5/3 to home. - Continue ampicillin  EOT 6/12 - Has follow-up with Dr. Gillian Lacrosse on 5/27

## 2024-01-01 NOTE — Assessment & Plan Note (Signed)
 Normocytic anemia due to blood loss, likely postoperative on chronic iron  deficiency.  Stores low here.     Transfused 2 units 5/18, hemoglobin up to 9.3 and stable - Continue oral iron 

## 2024-01-01 NOTE — Plan of Care (Signed)

## 2024-01-01 NOTE — Assessment & Plan Note (Signed)
 S/p ORIF by Dr. Cherl Corner 5/17 - Strict NWB RLE - Short leg splint until follow up with Dr. Cherl Corner - Suture removal in Ortho office

## 2024-01-01 NOTE — Assessment & Plan Note (Signed)
 -  CPAP at night

## 2024-01-01 NOTE — TOC Progression Note (Signed)
 Transition of Care [facility]) - Progression Note    Patient Details  Name: [name] MRN: [id number] Date of Birth: 08-25-1950  Transition [facility] Care [facility]) CM/SW Contact  Delilah Fend, LCSW Phone Number: 01/01/2024, 2:03 PM  Clinical Narrative:     Have received insurance authorization for [facility] SNF (auth# [id number]) and ambulance transport (auth# [id number]).  MD anticipates likely medically ready for dc tomorrow.  Pt and daughter updated.  Expected Discharge Plan: Skilled Nursing Facility Barriers to Discharge: Continued Medical Work up  Expected Discharge Plan and Services In-house Referral: Clinical Social Work   Post Acute Care Choice: Skilled Nursing Facility Living arrangements for the past 2 months: Single Family Home                 DME Arranged: N/A DME Agency: NA                   Social Determinants of Health (SDOH) Interventions SDOH Screenings   Food Insecurity: No Food Insecurity (12/26/2023)  Housing: Low Risk  (12/26/2023)  Transportation Needs: No Transportation Needs (12/26/2023)  Utilities: Not At Risk (12/26/2023)  Alcohol  Screen: Low Risk  (07/29/2023)  Depression (PHQ2-9): Low Risk  (11/02/2023)  Financial Resource Strain: Low Risk  (07/29/2023)  Physical Activity: Inactive (07/29/2023)  Social Connections: Moderately Integrated (12/26/2023)  Stress: No Stress Concern Present (07/29/2023)  Tobacco Use: Low Risk  (12/26/2023)    Readmission Risk Interventions    12/27/2023   11:05 AM 12/13/2023    2:57 PM  Readmission Risk Prevention Plan  Transportation Screening Complete Complete  PCP or Specialist Appt within 5-7 Days  Complete  PCP or Specialist Appt within 3-5 Days Complete   Home Care Screening  Complete  Medication Review (RN CM)  Complete  HRI or Home Care Consult Complete   Social Work Consult for Recovery Care Planning/Counseling Complete   Palliative Care Screening Not Applicable   Medication Review Oceanographer) Complete

## 2024-01-01 NOTE — Assessment & Plan Note (Signed)
AKI ruled out.  Cr stable relative to baseline

## 2024-01-01 NOTE — Assessment & Plan Note (Signed)
 See note from 5/19

## 2024-01-01 NOTE — Assessment & Plan Note (Signed)
 Had been on amiodarone  for frequent PVCs.  Amiodarone  recently stopped due to concern for lung toxicity - Hold amiodarone  - Continue metoprolol 

## 2024-01-01 NOTE — Progress Notes (Signed)
 Subjective: 3 Days Post-Op Procedure(s) (LRB): OPEN REDUCTION INTERNAL FIXATION (ORIF) ANKLE FRACTURE (Right) Patient reports pain as mild.   Patient seen in rounds for Dr. France Ina. Patient is doing well in regards to knee  Objective: Vital signs in last 24 hours: Temp:  [97.7 F (36.5 C)-98.3 F (36.8 C)] 97.7 F (36.5 C) (05/20 0554) Pulse Rate:  [61-64] 64 (05/20 0554) Resp:  [16-18] 16 (05/20 0554) BP: (104-110)/(50-53) 104/53 (05/20 0554) SpO2:  [97 %-99 %] 97 % (05/20 0554) FiO2 (%):  [21 %] 21 % (05/19 2255)  Intake/Output from previous day:  Intake/Output Summary (Last 24 hours) at 01/01/2024 1137 Last data filed at 01/01/2024 1000 Gross per 24 hour  Intake 2060 ml  Output 2850 ml  Net -790 ml    Intake/Output this shift: Total I/O In: 600 [P.O.:600] Out: 100 [Urine:100]  Labs: Recent Labs    12/30/23 0302 12/31/23 0512 01/01/24 0812  HGB 7.6* 9.3* 9.3*   Recent Labs    12/31/23 0512 01/01/24 0812  WBC 9.5 8.5  RBC 3.27* 3.32*  HCT 30.1* 30.3*  PLT 282 301   Recent Labs    12/31/23 0512 01/01/24 0812  NA 135 135  K 4.3 4.1  CL 102 101  CO2 25 24  BUN 20 26*  CREATININE 1.19* 1.20*  GLUCOSE 112* 146*  CALCIUM  8.3* 8.4*   No results for input(s): "LABPT", "INR" in the last 72 hours.  Exam: General - Patient is Alert and Oriented Extremity - Neurologically intact Neurovascular intact Sensation intact distally Dorsiflexion/Plantar flexion intact Dressing/Incision -  Aquacel dressing without drainage. This was removed, incision shows no bloody drainage. Minimal swelling. No erythema or overt warmth.  Right ankle is in a short leg splint with soft padding.  Motor Function - intact, moving toes well on exam.  Past Medical History:  Diagnosis Date   Agatston coronary artery calcium  score greater than 400 05/2022   coronary Ca score 856   Anemia    Back pain    Blood transfusion    1981   Cancer (HCC)    basal cell carcinoma, x 3    on leg and bottom of spine   Chronic diastolic CHF (congestive heart failure) (HCC)    diastolic    Chronic kidney disease    CKD3a   COVID    DDD (degenerative disc disease), cervical    DDD (degenerative disc disease), lumbar    Family history of coronary arteriosclerosis- strong fam h/o CAD and early CAD.  07/17/2017   Fibromyalgia    GERD (gastroesophageal reflux disease)    Heart murmur    as a child   Hiatal hernia    sjorgens syndrome   High blood pressure    High cholesterol    IBS (irritable bowel syndrome)    OSA (obstructive sleep apnea)    Osteoarthritis    Peripheral autonomic neuropathy of unknown cause    Pneumonia    Pre-diabetes    PVC (premature ventricular contraction)    Raynaud disease    Rheumatoid arthritis (HCC)    Sjogren's disease (HCC)    Urticaria    Vitamin D  deficiency     Assessment/Plan: 3 Days Post-Op Procedure(s) (LRB): OPEN REDUCTION INTERNAL FIXATION (ORIF) ANKLE FRACTURE (Right) Principal Problem:   Ankle fracture, bimalleolar, closed Active Problems:   History of PVC (premature ventricular contraction)   Peripheral neuropathy   Chronic diastolic CHF (congestive heart failure) (HCC)   GERD (gastroesophageal reflux disease)   Sjogren  syndrome (HCC)   History of IBS   Hyperlipidemia   Essential hypertension   Infection of prosthetic right knee joint (HCC)   Infection of total right knee replacement (HCC)   Fall at home, initial encounter   History of rheumatoid arthritis   Hypokalemia   Acute kidney injury superimposed on chronic kidney disease (HCC)  Estimated body mass index is 39.97 kg/m as calculated from the following:   Height as of this encounter: 5' 1.5" (1.562 m).   Weight as of this encounter: 97.5 kg.  We can remove remaining staples today and apply gauze/paper tape dressing to keep incision covered while at hospital/SNF. Will follow-up in our office in 2 weeks.  Sharlynn Dear, PA-C Orthopedic  Surgery [phone] 01/01/2024, 11:37 AM

## 2024-01-01 NOTE — Assessment & Plan Note (Signed)
 Eosinophilic esophagitis - Continue pantoprazole 

## 2024-01-01 NOTE — Progress Notes (Signed)
 Progress Note   Patient: [name] ZOX:[id number] DOB: [birth year] DOA: 12/26/2023     6 DOS: the patient was seen and examined on 01/01/2024 at 10:42 AM      Brief hospital course: 73 y.o. F with recent prosthetic R knee infection on ampicillin , hx RA on Arava /HCQ, fibromyalgia/neuropathy, CKD IIIb baseline 1.2-1.4, eosinophilic esophagitis, frequent PVCs no longer on amiodarone  due to concern for amio-lung toxicity, OSA, HTN, dCHF, and MO who presented with fall and right ankle fracture.  Had had right knee washout 3 weeks ago.  Did well, discharged home with PICC but on day of admission, was ambulating with walker, and she fell and had severe leg pain.  In the ER, found to have ankle fracture.  S/p ORIF by Dr. Cherl Corner on 5/17     Assessment and Plan: * Right displaced trimalleolar ankle fracture with syndesmosis disruption S/p ORIF by Dr. Cherl Corner 5/17 - Strict NWB RLE - Short leg splint until follow up with Dr. Cherl Corner - Suture removal in Ortho office    Infection of prosthetic right knee joint (HCC) Had developed progressive right knee pain over preceding months.  Admitted for elective knee I&D with poly-exchange by Dr. France Ina on 4/30, culture grew E faecalis and discharged on ampicillin  via PICC on 5/3 to home. - Continue ampicillin  EOT 6/12 - Has follow-up with Dr. Gillian Lacrosse on 5/27  Obesity, Class III, BMI 40-49.9 (morbid obesity)    IDA (iron  deficiency anemia) Normocytic anemia due to blood loss, likely postoperative on chronic iron  deficiency.  Stores low here.     Transfused 2 units 5/18, hemoglobin up to 9.3 and stable - Continue oral iron   Chronic kidney disease, stage 3b (HCC) AKI ruled out.  Cr stable relative to baseline  GERD (gastroesophageal reflux disease) Eosinophilic esophagitis - Continue pantoprazole   Chronic diastolic CHF (congestive heart failure) (HCC) Hypertension Hyperlipidemia BP normal.  Appears euvolemic -Continue  fenofibrate , Crestor  - Resume colestipol , spironolactone , torsemide  at discharge  Peripheral neuropathy Fibromyalgia - Continue gabapentin   History of PVC (premature ventricular contraction) Had been on amiodarone  for frequent PVCs.  Amiodarone  recently stopped due to concern for lung toxicity - Hold amiodarone  - Continue metoprolol   Rheumatoid arthritis (HCC) See note from 5/19  OSA (obstructive sleep apnea) - CPAP at night          Subjective: Patient reported her pain was finally improving with rest, and she was better but still in too much pain to mobilize safely.   No fever, no dyspnea, no swelling.     Physical Exam: BP 120/61 (BP Location: Left Arm)   Pulse 64   Temp 97.6 F (36.4 C) (Oral)   Resp 16   Ht 5' 1.5" (1.562 m)   Wt 97.5 kg   SpO2 99%   BMI 39.97 kg/m   Obese adult female, lying bed, interactive and appropriate RRR, no murmurs, no peripheral edema Respiratory rate normal, lungs clear without rales or wheezes Abdomen soft no tenderness palpation or guarding, no ascites or distention The right leg has no redness around her knee incision, the right ankle is wrapped in a splint Attention normal, affect normal, judgment and insight appear normal, face metric, speech fluent, upper extremity strength normal    Data Reviewed: Basic metabolic panel shows creatinine stable at 1.2, sodium and potassium normal CBC shows hemoglobin stable at 9.3, no leukocytosis  Family Communication: Daughter at the bedside    Disposition: Status is: Inpatient Patient was admitted for ankle fracture.  In the setting  of her fibromyalgia, recent knee infection and prosthesis exchange, she is having considerable pain in inability to move.  Given her obesity and inability to bear weight on the right ankle, and ongoing pain, feel it is safest to keep her 1 additional day in the hospital to build up her strength for rehabilitation  tomorrow        Author: Ephriam Hashimoto, MD 01/01/2024 6:21 PM  For on call review [email or web address].

## 2024-01-01 NOTE — Assessment & Plan Note (Signed)
 Hypertension Hyperlipidemia BP normal.  Appears euvolemic -Continue fenofibrate , Crestor  - Resume colestipol , spironolactone , torsemide  at discharge

## 2024-01-02 ENCOUNTER — Other Ambulatory Visit

## 2024-01-02 ENCOUNTER — Ambulatory Visit: Admitting: Family Medicine

## 2024-01-02 ENCOUNTER — Ambulatory Visit: Admitting: Oncology

## 2024-01-02 DIAGNOSIS — E785 Hyperlipidemia, unspecified: Secondary | ICD-10-CM | POA: Diagnosis not present

## 2024-01-02 DIAGNOSIS — R278 Other lack of coordination: Secondary | ICD-10-CM | POA: Diagnosis not present

## 2024-01-02 DIAGNOSIS — R0602 Shortness of breath: Secondary | ICD-10-CM | POA: Diagnosis not present

## 2024-01-02 DIAGNOSIS — H5213 Myopia, bilateral: Secondary | ICD-10-CM | POA: Diagnosis not present

## 2024-01-02 DIAGNOSIS — R5383 Other fatigue: Secondary | ICD-10-CM | POA: Diagnosis not present

## 2024-01-02 DIAGNOSIS — M545 Low back pain, unspecified: Secondary | ICD-10-CM | POA: Diagnosis not present

## 2024-01-02 DIAGNOSIS — H524 Presbyopia: Secondary | ICD-10-CM | POA: Diagnosis not present

## 2024-01-02 DIAGNOSIS — Z452 Encounter for adjustment and management of vascular access device: Secondary | ICD-10-CM | POA: Diagnosis not present

## 2024-01-02 DIAGNOSIS — M3500 Sicca syndrome, unspecified: Secondary | ICD-10-CM | POA: Diagnosis not present

## 2024-01-02 DIAGNOSIS — H2513 Age-related nuclear cataract, bilateral: Secondary | ICD-10-CM | POA: Diagnosis not present

## 2024-01-02 DIAGNOSIS — I959 Hypotension, unspecified: Secondary | ICD-10-CM | POA: Diagnosis not present

## 2024-01-02 DIAGNOSIS — M069 Rheumatoid arthritis, unspecified: Secondary | ICD-10-CM | POA: Diagnosis not present

## 2024-01-02 DIAGNOSIS — R52 Pain, unspecified: Secondary | ICD-10-CM | POA: Diagnosis not present

## 2024-01-02 DIAGNOSIS — Z66 Do not resuscitate: Secondary | ICD-10-CM | POA: Diagnosis not present

## 2024-01-02 DIAGNOSIS — B952 Enterococcus as the cause of diseases classified elsewhere: Secondary | ICD-10-CM | POA: Diagnosis not present

## 2024-01-02 DIAGNOSIS — I493 Ventricular premature depolarization: Secondary | ICD-10-CM | POA: Diagnosis not present

## 2024-01-02 DIAGNOSIS — G4733 Obstructive sleep apnea (adult) (pediatric): Secondary | ICD-10-CM | POA: Diagnosis not present

## 2024-01-02 DIAGNOSIS — D649 Anemia, unspecified: Secondary | ICD-10-CM | POA: Diagnosis not present

## 2024-01-02 DIAGNOSIS — Z79899 Other long term (current) drug therapy: Secondary | ICD-10-CM | POA: Diagnosis not present

## 2024-01-02 DIAGNOSIS — M6281 Muscle weakness (generalized): Secondary | ICD-10-CM | POA: Diagnosis not present

## 2024-01-02 DIAGNOSIS — I10 Essential (primary) hypertension: Secondary | ICD-10-CM | POA: Diagnosis not present

## 2024-01-02 DIAGNOSIS — M810 Age-related osteoporosis without current pathological fracture: Secondary | ICD-10-CM | POA: Diagnosis not present

## 2024-01-02 DIAGNOSIS — M0579 Rheumatoid arthritis with rheumatoid factor of multiple sites without organ or systems involvement: Secondary | ICD-10-CM | POA: Diagnosis not present

## 2024-01-02 DIAGNOSIS — E119 Type 2 diabetes mellitus without complications: Secondary | ICD-10-CM | POA: Diagnosis not present

## 2024-01-02 DIAGNOSIS — T8453XD Infection and inflammatory reaction due to internal right knee prosthesis, subsequent encounter: Secondary | ICD-10-CM

## 2024-01-02 DIAGNOSIS — I5032 Chronic diastolic (congestive) heart failure: Secondary | ICD-10-CM | POA: Diagnosis not present

## 2024-01-02 DIAGNOSIS — S82851D Displaced trimalleolar fracture of right lower leg, subsequent encounter for closed fracture with routine healing: Secondary | ICD-10-CM | POA: Diagnosis not present

## 2024-01-02 DIAGNOSIS — R2689 Other abnormalities of gait and mobility: Secondary | ICD-10-CM | POA: Diagnosis not present

## 2024-01-02 DIAGNOSIS — R942 Abnormal results of pulmonary function studies: Secondary | ICD-10-CM | POA: Diagnosis not present

## 2024-01-02 DIAGNOSIS — K2100 Gastro-esophageal reflux disease with esophagitis, without bleeding: Secondary | ICD-10-CM | POA: Diagnosis not present

## 2024-01-02 DIAGNOSIS — R931 Abnormal findings on diagnostic imaging of heart and coronary circulation: Secondary | ICD-10-CM | POA: Diagnosis not present

## 2024-01-02 DIAGNOSIS — S82851A Displaced trimalleolar fracture of right lower leg, initial encounter for closed fracture: Principal | ICD-10-CM

## 2024-01-02 DIAGNOSIS — E559 Vitamin D deficiency, unspecified: Secondary | ICD-10-CM | POA: Diagnosis not present

## 2024-01-02 DIAGNOSIS — D500 Iron deficiency anemia secondary to blood loss (chronic): Secondary | ICD-10-CM | POA: Diagnosis not present

## 2024-01-02 DIAGNOSIS — Z7401 Bed confinement status: Secondary | ICD-10-CM | POA: Diagnosis not present

## 2024-01-02 DIAGNOSIS — E611 Iron deficiency: Secondary | ICD-10-CM | POA: Diagnosis not present

## 2024-01-02 DIAGNOSIS — M797 Fibromyalgia: Secondary | ICD-10-CM | POA: Diagnosis not present

## 2024-01-02 DIAGNOSIS — K219 Gastro-esophageal reflux disease without esophagitis: Secondary | ICD-10-CM | POA: Diagnosis not present

## 2024-01-02 DIAGNOSIS — J329 Chronic sinusitis, unspecified: Secondary | ICD-10-CM | POA: Diagnosis not present

## 2024-01-02 DIAGNOSIS — K5900 Constipation, unspecified: Secondary | ICD-10-CM | POA: Diagnosis not present

## 2024-01-02 DIAGNOSIS — N1832 Chronic kidney disease, stage 3b: Secondary | ICD-10-CM | POA: Diagnosis not present

## 2024-01-02 DIAGNOSIS — L89324 Pressure ulcer of left buttock, stage 4: Secondary | ICD-10-CM | POA: Diagnosis not present

## 2024-01-02 LAB — CBC
HCT: 29.6 % — ABNORMAL LOW (ref 36.0–46.0)
Hemoglobin: 9.2 g/dL — ABNORMAL LOW (ref 12.0–15.0)
MCH: 28.5 pg (ref 26.0–34.0)
MCHC: 31.1 g/dL (ref 30.0–36.0)
MCV: 91.6 fL (ref 80.0–100.0)
Platelets: 299 10*3/uL (ref 150–400)
RBC: 3.23 MIL/uL — ABNORMAL LOW (ref 3.87–5.11)
RDW: 14.9 % (ref 11.5–15.5)
WBC: 8.2 10*3/uL (ref 4.0–10.5)
nRBC: 0 % (ref 0.0–0.2)

## 2024-01-02 LAB — BASIC METABOLIC PANEL WITH GFR
Anion gap: 7 (ref 5–15)
BUN: 26 mg/dL — ABNORMAL HIGH (ref 8–23)
CO2: 25 mmol/L (ref 22–32)
Calcium: 8.4 mg/dL — ABNORMAL LOW (ref 8.9–10.3)
Chloride: 104 mmol/L (ref 98–111)
Creatinine, Ser: 1.08 mg/dL — ABNORMAL HIGH (ref 0.44–1.00)
GFR, Estimated: 55 mL/min — ABNORMAL LOW (ref >60)
Glucose, Bld: 112 mg/dL — ABNORMAL HIGH (ref 70–99)
Potassium: 4.1 mmol/L (ref 3.5–5.1)
Sodium: 136 mmol/L (ref 135–145)

## 2024-01-02 MED ORDER — OXYCODONE HCL 5 MG PO TABS: 5.0000 mg | ORAL_TABLET | Freq: Four times a day (QID) | ORAL | 0 refills | Status: DC | PRN

## 2024-01-02 MED ORDER — AMPICILLIN IV (FOR PTA / DISCHARGE USE ONLY)
12.0000 g | INTRAVENOUS | 0 refills | Status: DC
Start: 2024-01-02 — End: 2024-01-02

## 2024-01-02 MED ORDER — LORAZEPAM 0.5 MG PO TABS
0.5000 mg | ORAL_TABLET | Freq: Every evening | ORAL | 0 refills | Status: AC | PRN
Start: 2024-01-02 — End: ?

## 2024-01-02 MED ORDER — LORAZEPAM 0.5 MG PO TABS: 0.5000 mg | ORAL_TABLET | Freq: Every evening | ORAL | 0 refills | Status: DC | PRN

## 2024-01-02 MED ORDER — AMPICILLIN IV (FOR PTA / DISCHARGE USE ONLY): 2.0000 g | INTRAVENOUS | Status: AC

## 2024-01-02 MED ORDER — ONDANSETRON HCL 4 MG PO TABS: 4.0000 mg | ORAL_TABLET | Freq: Four times a day (QID) | ORAL | 0 refills | Status: AC | PRN

## 2024-01-02 MED ORDER — POLYETHYLENE GLYCOL 3350 17 G PO PACK: 17.0000 g | PACK | Freq: Every day | ORAL | Status: AC | PRN

## 2024-01-02 MED ORDER — HEPARIN SOD (PORK) LOCK FLUSH 100 UNIT/ML IV SOLN
250.0000 [IU] | INTRAVENOUS | Status: AC | PRN
  Administered 2024-01-02: 250 [IU]

## 2024-01-02 MED ORDER — FERROUS SULFATE 325 (65 FE) MG PO TABS: 325.0000 mg | ORAL_TABLET | ORAL | 0 refills | Status: AC

## 2024-01-02 MED ORDER — METHOCARBAMOL 500 MG PO TABS: 500.0000 mg | ORAL_TABLET | Freq: Four times a day (QID) | ORAL | 0 refills | Status: AC | PRN

## 2024-01-02 MED ORDER — AMPICILLIN IV (FOR PTA / DISCHARGE USE ONLY)
12.0000 g | INTRAVENOUS | Status: DC
Start: 2024-01-02 — End: 2024-01-02

## 2024-01-02 MED ORDER — ASPIRIN 81 MG PO TBEC: 81.0000 mg | DELAYED_RELEASE_TABLET | Freq: Every day | ORAL | 12 refills | Status: AC

## 2024-01-02 MED ORDER — LORAZEPAM 0.5 MG PO TABS
0.5000 mg | ORAL_TABLET | Freq: Every evening | ORAL | 0 refills | Status: DC | PRN
Start: 2024-01-02 — End: 2024-01-02

## 2024-01-02 NOTE — Progress Notes (Signed)
 Physical Therapy Treatment Patient Details Name: [name] MRN: [id number] DOB: [birth year] Today's Date: 01/02/2024   History of Present Illness [name] is a 73 y.o. 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    PT Comments  Pt comes to sitting EOB with min A for RLE management, increased time and use of bedrail/elevated HOB. Once sitting, pt with increased pain as knee flexes and leg goes into dependent position, needing seated rest break. Attempted STS from EOB with pt pulling from braced RW and from pushing from bed without success. Pillow under R foot to assess RLW NWB, pt reports feeling as though she is weightbearing but no pressure noted, returned to sitting for safety. Pt laterally scoots up to the [facility] using BUE and LLE, min A for therapist to maintain RLE off of floor, increased time to complete. Assisted pt back to supine with all needs in reach.    If plan is discharge home, recommend the following: Assistance with cooking/housework;Assist for transportation;Help with stairs or ramp for entrance;A lot of help with walking and/or transfers;Two people to help with walking and/or transfers;A lot of help with bathing/dressing/bathroom;Two people to help with bathing/dressing/bathroom   Can travel by private vehicle     No  Equipment Recommendations  None recommended by PT    Recommendations for Other Services       Precautions /  Restrictions Precautions Precautions: Fall Recall of Precautions/Restrictions: Intact Required Braces or Orthoses: Splint/Cast Splint/Cast: R ankle Restrictions Weight Bearing Restrictions Per Provider Order: Yes RLE Weight Bearing Per Provider Order: Non weight bearing     Mobility  Bed Mobility Overal bed mobility: Needs Assistance Bed Mobility: Sit to Supine, Supine to Sit     Supine to sit: Min assist, HOB elevated, Used rails Sit to supine: HOB elevated, Used rails, Min assist   General bed mobility comments: min A to mobilize RLE into/out of bed, increased time and effort    Transfers Overall transfer level: Needs assistance Equipment used: Rolling walker (2 wheels)               General transfer comment: attempted STS with pt pushing from bed and pulling from braced RW, as pt powers to stand she yells that she feels like she is putting weight on R foot, placed pillow under foot and no pressure noted but returned to sitting for safety; pt laterally scoots along EOB up to [facility] with min A as therapist maintains RLE elevated off of floor    Ambulation/Gait                   Stairs             Wheelchair Mobility     Tilt Bed    Modified Rankin (Stroke Patients Only)       Balance Overall balance assessment: Needs assistance Sitting-balance support: Feet supported, Bilateral upper extremity supported Sitting balance-Leahy Scale: Poor Sitting balance - Comments: UE support  Communication Communication Communication: No apparent difficulties  Cognition Arousal: Alert Behavior During Therapy: WFL for tasks assessed/performed, Anxious   PT - Cognitive impairments: No apparent impairments                       PT - Cognition Comments: pt aware of WB precautions, verbalizes frustrations with current mobility limitations, daughter at bedside offering encouragement Following commands:  Intact      Cueing Cueing Techniques: Verbal cues  Exercises      General Comments        Pertinent Vitals/Pain Pain Assessment Pain Assessment: Faces Faces Pain Scale: Hurts worst Pain Location: R ankle and knee Pain Descriptors / Indicators: Stabbing, Grimacing, Guarding, Constant Pain Intervention(s): Limited activity within patient's tolerance, Monitored during session, Premedicated before session, Repositioned    Home Living                          Prior Function            PT Goals (current goals can now be found in the care plan section) Acute Rehab PT Goals Patient Stated Goal: decrease pain PT Goal Formulation: With patient Time For Goal Achievement: 01/13/24 Potential to Achieve Goals: Good Progress towards PT goals: Progressing toward goals    Frequency    Min 3X/week      PT Plan      Co-evaluation              AM-PAC PT "6 Clicks" Mobility   Outcome Measure  Help needed turning from your back to your side while in a flat bed without using bedrails?: A Lot Help needed moving from lying on your back to sitting on the side of a flat bed without using bedrails?: A Lot Help needed moving to and from a bed to a chair (including a wheelchair)?: Total Help needed standing up from a chair using your arms (e.g., wheelchair or bedside chair)?: Total Help needed to walk in hospital room?: Total Help needed climbing 3-5 steps with a railing? : Total 6 Click Score: 8    End of Session Equipment Utilized During Treatment: Gait belt Activity Tolerance: Patient tolerated treatment well;Patient limited by pain Patient left: in bed;with call bell/phone within reach;with family/visitor present;with bed alarm set Nurse Communication: Mobility status PT Visit Diagnosis: Difficulty in walking, not elsewhere classified (R26.2);Pain;Unsteadiness on feet (R26.81) Pain - Right/Left: Right Pain - part of body: Ankle and joints of foot     Time:  1134-1207 PT Time Calculation (min) (ACUTE ONLY): 33 min  Charges:    $Therapeutic Activity: 23-37 mins PT General Charges $$ ACUTE PT VISIT: 1 Visit                     Tori Gearline Spilman PT, DPT 01/02/24, 12:23 PM

## 2024-01-02 NOTE — Progress Notes (Signed)
 Nurse called report to [facility].  Pt's AVS in discharge packet for PTAR. Pt aware of discharge to SNF today and has been hep locked.  Pt awaiting transport.

## 2024-01-02 NOTE — Discharge Summary (Addendum)
 Physician Discharge Summary   Patient: [name] MRN: [id number] DOB: 1951-05-10  Admit date:     12/26/2023  Discharge date: 01/02/24  Discharge Physician: Jerline Moon   PCP: Laneta Pintos, MD   Recommendations at discharge:   * Right displaced trimalleolar ankle fracture with syndesmosis disruption S/p ORIF present on admission 5/17 - Strict NWB RLE - Maintain short leg splint until follow up with Dr. Cherl Corner 7-10 days for wound check, exchange of splint for cast    Infection of prosthetic right knee joint [facility]) present on admission Continue ampicillin  EOT 6/12 Has follow-up with Dr. Gillian Lacrosse on 5/27   Rheumatoid arthritis (HCC) Arava  on hold due to a pressure ulcer last fall.  Given her worsening renal function and concerns for immunosuppression and her joint infection, this remains on hold. Patient reports hydroxychloroquine  also on hold, although last Rheum note 3/27 states both "She has been unsure of how much clinical benefit she has noticed since resuming Plaquenil " but also "For now she will remain on Plaquenil  as prescribed " and I see no other documentation that it was stopped later. Follow up with Rheum after discharge regarding HCQ  Discharge Diagnoses: Principal Problem:   Right displaced trimalleolar ankle fracture with syndesmosis disruption Active Problems:   Infection of prosthetic right knee joint (HCC)   OSA (obstructive sleep apnea)   Rheumatoid arthritis (HCC)   History of PVC (premature ventricular contraction)   Peripheral neuropathy   Chronic diastolic CHF (congestive heart failure) (HCC)   GERD (gastroesophageal reflux disease)   Hyperlipidemia   Essential hypertension   Chronic kidney disease, stage 3b (HCC)   IDA (iron  deficiency anemia)   Hypokalemia   Obesity, Class III, BMI 40-49.9 (morbid obesity)  Resolved Problems:   * No resolved hospital problems. *  Hospital Course: 73 year old woman PMH including prosthetic joint  infection right knee on IV antibiotics, fell sustaining right ankle fracture.  Seen by orthopedics and underwent operative fixation.  Plan for transfer to SNF.  [facility] Orthopedics   Procedures/Events 5/17 ORIF right trimalleolar ankle fracture, syndesmosis  * Right displaced trimalleolar ankle fracture with syndesmosis disruption S/p ORIF present on admission 5/17 - Strict NWB RLE - Maintain short leg splint until follow up with Dr. Cherl Corner 7-10 days for wound check, exchange of splint for cast    Infection of prosthetic right knee joint (HCC) present on admission Had developed progressive right knee pain over preceding months.  Admitted for elective knee I&D with poly-exchange by Dr. France Ina on 4/30, culture grew E faecalis and discharged on ampicillin  via PICC on 5/3 to home. Continue ampicillin  EOT 6/12 Has follow-up with Dr. Gillian Lacrosse on 5/27     IDA (iron  deficiency anemia) Normocytic anemia due to blood loss, likely postoperative on chronic iron  deficiency.  Transfused 2 units 5/18, hemoglobin up to 9.3 and stable Continue oral iron    Chronic kidney disease, stage 3b (HCC) AKI ruled out.  Cr stable relative to baseline   GERD (gastroesophageal reflux disease) Eosinophilic esophagitis Continue pantoprazole    Chronic diastolic CHF (congestive heart failure) (HCC) Hypertension Hyperlipidemia Stable. Continue fenofibrate , Crestor  Resume colestipol , spironolactone , torsemide  at discharge   Peripheral neuropathy Fibromyalgia Continue gabapentin    Frequent PVCs Amiodarone  recently stopped due to concern for lung toxicity Continue metoprolol    Rheumatoid arthritis (HCC) Arava  on hold due to a pressure ulcer last fall.  Given her worsening renal function and concerns for immunosuppression and her joint infection, this remains on hold. Patient reports hydroxychloroquine  also on hold,  although last Rheum note 3/27 states both "She has been unsure of how much clinical  benefit she has noticed since resuming Plaquenil " but also "For now she will remain on Plaquenil  as prescribed " and I see no other documentation that it was stopped later. Follow up with Rheum after discharge regarding HCQ   OSA (obstructive sleep apnea) CPAP at night  Obesity, Class III, BMI 40-49.9 (morbid obesity)   Pain control - [place]  Controlled Substance Reporting System database was reviewed.   Disposition: Skilled nursing facility Diet recommendation:  Regular diet DISCHARGE MEDICATION: Allergies as of 01/02/2024       Reactions   Sulfa Antibiotics Hives, Itching   Flushing   Cymbalta [duloxetine Hcl] Other (See Comments)   Manic reaction   Demerol Hives, Other (See Comments)   Fever    Ivp Dye [iodinated Contrast Media] Hives, Other (See Comments)   Fever   Nsaids Other (See Comments)   CKD stage 3b - GFR 41   Adhesive [tape] Rash        Medication List     PAUSE taking these medications    hydroxychloroquine  200 MG tablet Wait to take this until your doctor or other care provider tells you to start again. Commonly known as: PLAQUENIL  TAKE 1 TABLET (200 MG TOTAL) BY MOUTH DAILY.       STOP taking these medications    amiodarone  200 MG tablet Commonly known as: PACERONE    aspirin  81 MG chewable tablet Replaced by: aspirin  EC 81 MG tablet   diphenoxylate -atropine  2.5-0.025 MG tablet Commonly known as: LOMOTIL    promethazine  25 MG tablet Commonly known as: PHENERGAN    traMADol  50 MG tablet Commonly known as: ULTRAM        TAKE these medications    ampicillin  IVPB Inject 2 g into the vein every 4 (four) hours for 22 days. Indication:  E faecalis PJI First Dose: Yes Last Day of Therapy:  01/24/24 Labs - Once weekly:  CBC/D and BMP, Labs - Once weekly: ESR and CRP Method of administration may be changed at the discretion of home infusion pharmacist based upon assessment of the patient and/or caregiver's ability to self-administer the  medication ordered. What changed:  how much to take when to take this additional instructions   aspirin  EC 81 MG tablet Take 1 tablet (81 mg total) by mouth daily. Swallow whole. Start taking on: Jan 03, 2024 Replaces: aspirin  81 MG chewable tablet   cetirizine  10 MG tablet Commonly known as: ZYRTEC  TAKE 1 TABLET (10 MG TOTAL) BY MOUTH DAILY.   colestipol  1 g tablet Commonly known as: COLESTID  Take 1-2 g by mouth daily as needed (ibs flare up).   cycloSPORINE  0.05 % ophthalmic emulsion Commonly known as: RESTASIS  Place 2 drops into both eyes 2 (two) times daily as needed (dry eyes).   fenofibrate  48 MG tablet Commonly known as: Tricor  Take 1 tablet (48 mg total) by mouth daily.   ferrous sulfate  325 (65 FE) MG tablet Take 1 tablet (325 mg total) by mouth every other day. Start taking on: Jan 04, 2024   folic acid  1 MG tablet Commonly known as: FOLVITE  Take 1 tablet (1 mg total) by mouth daily.   gabapentin  300 MG capsule Commonly known as: NEURONTIN  TAKE 2 CAPSULES BY MOUTH 2 TIMES DAILY. What changed: See the new instructions.   hyoscyamine  0.125 MG Tbdp disintergrating tablet Commonly known as: ANASPAZ  Place 0.125 mg under the tongue every 6 (six) hours as needed for cramping.  LORazepam  0.5 MG tablet Commonly known as: ATIVAN  Take 1 tablet (0.5 mg total) by mouth at bedtime as needed for sleep. At bedtime prn What changed:  how much to take how to take this when to take this reasons to take this   methocarbamol  500 MG tablet Commonly known as: ROBAXIN  Take 1 tablet (500 mg total) by mouth every 6 (six) hours as needed for muscle spasms.   metoprolol  succinate 25 MG 24 hr tablet Commonly known as: TOPROL -XL TAKE 1/2 TABLET BY MOUTH (12.5 MG TOTAL) DAILY What changed: See the new instructions.   multivitamin with minerals Tabs tablet Take 1 tablet by mouth daily.   NONFORMULARY OR COMPOUNDED ITEM Apply 1 application  topically daily as needed  (irritation). Triamcinolone  0.1% & Silvadene cream 1:1- Apply as directed to affected areas as needed   nystatin  cream Commonly known as: MYCOSTATIN  Apply 1 Application topically daily.   omeprazole 40 MG capsule Commonly known as: PRILOSEC Take 40 mg by mouth daily as needed (acid reflux).   ondansetron  4 MG tablet Commonly known as: ZOFRAN  Take 1 tablet (4 mg total) by mouth every 6 (six) hours as needed for nausea.   oxyCODONE  5 MG immediate release tablet Commonly known as: Oxy IR/ROXICODONE  Take 1-2 tablets (5-10 mg total) by mouth every 6 (six) hours as needed for severe pain (pain score 7-10).   polyethylene glycol 17 g packet Commonly known as: MiraLax  Take 17 g by mouth daily as needed for mild constipation.   potassium chloride  10 MEQ tablet Commonly known as: KLOR-CON  Take 2 tablets (20 mEq total) by mouth daily.   rosuvastatin  10 MG tablet Commonly known as: CRESTOR  Take 1 tablet (10 mg total) by mouth daily. Please keep scheduled appointment for future refills. Thank you.   spironolactone  25 MG tablet Commonly known as: ALDACTONE  TAKE 1 TABLET (25 MG TOTAL) BY MOUTH DAILY. What changed: how much to take   Torsemide  40 MG Tabs Take 40 mg by mouth daily.               Discharge Care Instructions  (From admission, onward)           Start     Ordered   01/02/24 0000  Change dressing on IV access line weekly and PRN  (Home infusion instructions - [facility] )        01/02/24 1120   01/02/24 0000  Discharge wound care:       Comments: As per orthopedics   01/02/24 1123            Contact information for follow-up providers     Ali Ink, MD. Schedule an appointment as soon as possible for a visit in 10 day(s).   Specialty: Orthopedic Surgery Contact information: [address] [place] [address] [phone]         Liliane Rei, MD Follow up in 2 week(s).   Specialty: Orthopedic  Surgery Contact information: [address] [place] [address] [place] [address] [phone]              Contact information for after-discharge care     Destination     [facility]erred SN[facility] .   Service: Skilled Nursing Contact information: [address] [place]  [address] [phone]                    Had some constipation but had a BM Feeling otherwise ok  Discharge Exam: [facility]  12/26/23 0105  Weight: 97.5 kg   Physical Exam Vitals reviewed.  Constitutional:      General: She is not in acute distress.    Appearance: She is not ill-appearing or toxic-appearing.  Cardiovascular:     Rate and Rhythm: Normal rate and regular rhythm.     Heart sounds: No murmur heard. Pulmonary:     Effort: Pulmonary effort is normal. No respiratory distress.     Breath sounds: No wheezing, rhonchi or rales.  Neurological:     Mental Status: She is alert.  Psychiatric:        Mood and Affect: Mood normal.        Behavior: Behavior normal.    Condition at discharge: good  The results of significant diagnostics from this hospitalization (including imaging, microbiology, ancillary and laboratory) are listed below for reference.   Imaging Studies: DG Ankle 2 Views Right Result Date: 12/29/2023 CLINICAL DATA:  Elective surgery. Intraoperative fluoroscopy for right ankle ORIF. EXAM: RIGHT ANKLE - 2 VIEW COMPARISON:  Right ankle radiographs 12/26/2023 FINDINGS: Images were performed intraoperatively without the presence of a radiologist. New lateral plate and screw fixation of the distal fibula. This includes 3 distal tibiofibular syndesmosis interlocking screws. Single screw ORIF of the medial malleolus. The ankle mortise is symmetric and intact. Total fluoroscopy images: 3 Total fluoroscopy time: 84 seconds Total dose: Radiation Exposure Index (as provided by the fluoroscopic device): 3.1 mGy air Kerma Please see intraoperative findings  for further detail. IMPRESSION: Intraoperative fluoroscopy for right ankle ORIF. Electronically Signed   By: Bertina Broccoli M.D.   On: 12/29/2023 10:44   DG C-Arm 1-60 Min-No Report Result Date: 12/29/2023 Fluoroscopy was utilized by the requesting physician.  No radiographic interpretation.   DG C-Arm 1-60 Min-No Report Result Date: 12/29/2023 Fluoroscopy was utilized by the requesting physician.  No radiographic interpretation.   CT ANKLE RIGHT WO CONTRAST Result Date: 12/26/2023 CLINICAL DATA:  Ankle trauma after fall. EXAM: CT OF THE RIGHT ANKLE WITHOUT CONTRAST TECHNIQUE: Multidetector CT imaging of the right ankle was performed according to the standard protocol. Multiplanar CT image reconstructions were also generated. RADIATION DOSE REDUCTION: This exam was performed according to the departmental dose-optimization program which includes automated exposure control, adjustment of the mA and/or kV according to patient size and/or use of iterative reconstruction technique. COMPARISON:  Right ankle radiographs dated 12/26/2023. FINDINGS: Bones/Joint/Cartilage Acute distal tibial posterior malleolar fracture with approximately 3 mm above superior displacement of the posterior malleolar fracture fragment (series 7, images 23-29). Acute comminuted fracture of the medial malleolus with approximately 3 mm of medial displacement of the distal fracture component. Mild asymmetric narrowing of the medial clear space of the ankle mortise. Acute comminuted transverse oblique transsyndesmotic distal fibular fracture with approximately 4 mm of lateral displacement of the distal fracture component. Adjacent osseous fragments along the anterolateral joint space likely reflect ligamentous avulsion injury. Calcaneal enthesopathy at the insertion of the Achilles tendon and the origin of the central cord of the plantar fascia. Mild degenerative changes of the tibiotalar, posterior subtalar, and talonavicular joints with  joint space narrowing and osteophytosis. Ligaments Ligaments are suboptimally evaluated by CT. Muscles and Tendons Fatty atrophy of the intrinsic musculature of the foot. Visualized tendons are grossly unremarkable, although suboptimally evaluated on this exam. Soft tissue Circumferential subcutaneous edema of the right ankle extending to the foot. No loculated fluid collection identified. IMPRESSION: 1. Acute mildly displaced fracture of the posterior malleolus. 2. Acute comminuted mildly displaced fracture of the medial  malleolus. Mild asymmetric narrowing of the medial clear space of the ankle mortise. 3. Acute comminuted mildly displaced oblique fracture of the distal fibula. Adjacent osseous fragments along the anterolateral joint space likely reflect ligamentous avulsion injury. 4. Diffuse subcutaneous edema of the right ankle and foot. No loculated fluid collection. Electronically Signed   By: Mannie Seek M.D.   On: 12/26/2023 13:45   DG Ankle 2 Views Right Result Date: 12/26/2023 CLINICAL DATA:  Complex right ankle fracture following closed reduction. EXAM: RIGHT ANKLE - 2 VIEW COMPARISON:  Earlier study right ankle series today at 2:07 a.m. FINDINGS: 5:39 a.m. AP and lateral two views only. There is overlying fiberglass casting material. Alignment is improved. The distal fibular shaft fracture is now only displaced about 1 cortex width anteriorly and laterally and no longer angulated. Prior widening of the medial mortise has been reduced with the medial malleolar fracture fragment better in line with the parent bone. 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 No new fracture has become apparent. There is severe diffuse soft tissue swelling. IMPRESSION: 1. Improved alignment of the distal fibular shaft fracture and medial malleolar fracture fragment following closed reduction. 2. Prior widening of the medial mortise has been reduced. 3.  Severe diffuse soft tissue swelling. Electronically Signed   By: Denman Fischer M.D.   On: 12/26/2023 06:01   DG Ankle Complete Right Result Date: 12/26/2023 CLINICAL DATA:  fall EXAM: RIGHT ANKLE - COMPLETE 3+ VIEW COMPARISON:  X-ray right ankle 05/23/2022 FINDINGS: Acute comminuted and displaced medial malleolar and transsyndesmotic distal fibular fractures. No posterior malleolar fracture. No definite talar fracture. Midfoot degenerative changes. Plantar calcaneal spur. No aggressive appearing focal bone abnormality. Subcutaneus soft tissue edema. Vascular calcifications. IMPRESSION: Acute comminuted and displaced bimalleolar fracture. Electronically Signed   By: Morgane  Naveau M.D.   On: 12/26/2023 02:39   DG Knee Complete 4 Views Right Result Date: 12/26/2023 CLINICAL DATA:  Fall EXAM: RIGHT KNEE - COMPLETE 4+ VIEW COMPARISON:  None Available. FINDINGS: Markedly swollen soft tissues at the right knee with anterior skin staples. Status post total knee arthroplasty. Screw and plate fixation of the distal femur. No periprosthetic fracture IMPRESSION: Markedly swollen soft tissues at the right knee. No periprosthetic fracture. Electronically Signed   By: Juanetta Nordmann M.D.   On: 12/26/2023 02:34   US  EKG SITE RITE Result Date: 12/13/2023 If Site Rite image not attached, placement could not be confirmed due to current cardiac rhythm.   Microbiology: Results for orders placed or performed during the hospital encounter of 12/26/23  Surgical PCR screen     Status: Abnormal   Collection Time: 12/29/23 12:27 AM   Specimen: Nasal Mucosa; Nasal Swab  Result Value Ref Range Status   MRSA, PCR POSITIVE (A) NEGATIVE Final    Comment: RESULT CALLED TO, READ BACK BY AND VERIFIED WITH: PRESS, A. RN AT 12/29/2023 0231 BY JEREMY C    Staphylococcus aureus POSITIVE (A) NEGATIVE Final    Comment: (NOTE) The Xpert SA Assay (FDA approved for NASAL specimens in patients 84 years of age and older), is one  component of a comprehensive surveillance program. It is not intended to diagnose infection nor to guide or monitor treatment. Performed at [facility], [address] [place], [place] [address]    *Note: Due to a large number of results and/or encounters for the requested time period, some results have not been displayed. A complete set of results can  be found in Results Review.    Labs: CBC: Recent Labs  Lab 12/29/23 0332 12/30/23 0302 12/31/23 0512 01/01/24 0812 01/02/24 0334  WBC 7.3 9.3 9.5 8.5 8.2  HGB 7.7* 7.6* 9.3* 9.3* 9.2*  HCT 25.4* 24.2* 30.1* 30.3* 29.6*  MCV 91.7 90.0 92.0 91.3 91.6  PLT 212 252 282 301 299   Basic Metabolic Panel: Recent Labs  Lab 12/29/23 0332 12/30/23 0302 12/31/23 0512 01/01/24 0812 01/02/24 0334  NA 135 136 135 135 136  K 4.0 4.0 4.3 4.1 4.1  CL 103 103 102 101 104  CO2 24 23 25 24 25   GLUCOSE 126* 159* 112* 146* 112*  BUN 11 20 20  26* 26*  CREATININE 1.18* 0.95 1.19* 1.20* 1.08*  CALCIUM  8.1* 8.3* 8.3* 8.4* 8.4*   Liver Function Tests: Recent Labs  Lab 01/01/24 0812  AST 17  ALT 9  ALKPHOS 75  BILITOT 0.6  PROT 6.3*  ALBUMIN 2.3*   CBG: No results for input(s): "GLUCAP" in the last 168 hours.  Discharge time spent: greater than 30 minutes.  Signed: Jerline Moon, MD [facility] 01/02/2024

## 2024-01-02 NOTE — Progress Notes (Signed)
 PT Cancellation Note  Patient Details Name: [name] MRN: [id number] DOB: Aug 23, 1950   Cancelled Treatment:    Reason Eval/Treat Not Completed: Pain limiting ability to participate. Pt requesting to hold PT until after pain medication. Will check back as schedule permits.   Tori Jelisa Gracey PT, DPT 01/02/24, 10:15 AM

## 2024-01-02 NOTE — Progress Notes (Signed)
 OT Cancellation Note  Patient Details Name: [name] MRN: [id number] DOB: April 25, 1951   Cancelled Treatment:    Reason Eval/Treat Not Completed: Other (comment) Patient declined reporting she was planning to d/c to SNF in next hour. OT to defer to next level of care for further OT at this time.  Wynette Heckler, MS Acute Rehabilitation Department Office# [phone]  01/02/2024, 2:48 PM

## 2024-01-02 NOTE — TOC Transition Note (Signed)
 Transition of Care [facility]) - Discharge Note   Patient Details  Name: [name] MRN: [id number] Date of Birth: August 08, 1951  [facility]) CM/SW Contact:  Delilah Fend, LCSW Phone Number: 01/02/2024, 3:28 PM   Clinical Narrative:     Pt medically cleared for dc to [facility] SNF today and have insurance authorization.  Pt and daughter aware and agreeable.  Completing abx dosing at this time and PT[facility] has been called with request that pt arrive at SNF in time for 8pm dosing - they have made a note of this.  RN to call report to [phone].  No further TOC needs.  Final next level of care: Skilled Nursing Facility Barriers to Discharge: Barriers Resolved   Patient Goals and CMS Choice Patient states their goals for this hospitalization and ongoing recovery are:: pt hopeful that have SNF rehab prior to return home CMS Medicare.gov Compare Post Acute Care list provided to:: Patient Choice offered to / list presented to : Patient [place] ownership interest in [facility].provided to:: Patient    Discharge Placement              Patient chooses bed at: [facility] Patient to be transferred to facility by: PTAR Name of family member notified: daughter Patient and family notified of of transfer: 01/02/24  Discharge Plan and Services Additional resources added to the After Visit Summary for   In-house Referral: Clinical Social Work   Post Acute Care Choice: Skilled Nursing Facility          DME Arranged: N/A DME Agency: NA       HH Arranged: NA HH Agency: NA        Social Drivers of Health (SDOH) Interventions SDOH Screenings   Food Insecurity: No Food Insecurity (12/26/2023)  Housing: Low Risk  (12/26/2023)  Transportation Needs: No Transportation Needs (12/26/2023)  Utilities: Not At Risk (12/26/2023)  Alcohol  Screen: Low Risk  (07/29/2023)  Depression (PHQ2-9): Low Risk  (11/02/2023)  Financial Resource Strain: Low Risk  (07/29/2023)  Physical  Activity: Inactive (07/29/2023)  Social Connections: Moderately Integrated (12/26/2023)  Stress: No Stress Concern Present (07/29/2023)  Tobacco Use: Low Risk  (12/26/2023)     Readmission Risk Interventions    12/27/2023   11:05 AM 12/13/2023    2:57 PM  Readmission Risk Prevention Plan  Transportation Screening Complete Complete  PCP or Specialist Appt within 5-7 Days  Complete  PCP or Specialist Appt within 3-5 Days Complete   Home Care Screening  Complete  Medication Review (RN CM)  Complete  HRI or Home Care Consult Complete   Social Work Consult for Recovery Care Planning/Counseling Complete   Palliative Care Screening Not Applicable   Medication Review [employer]) Complete

## 2024-01-02 NOTE — Progress Notes (Signed)
 Patient discharged to SNF([facility]). [facility] came to pick her up. Patient discharged with picc line for her antibiotics. Incontinent care provided, oxy& robaxin  administered for pain before she left.

## 2024-01-02 NOTE — Discharge Instructions (Signed)
 Right knee incision directions: Keep covered with gauze and paper tape. Change dressing daily. Okay to remove for showering, no submersion of incision.

## 2024-01-02 NOTE — Progress Notes (Signed)
 [facility] Liaison Note  01/02/2024  [name] Feb 09, 1951 [id number]  Location: RN Hospital Liaison screened the patient remotely at [facility].  Insurance: [employer]   [name] is a 73 y.o. female who is a Primary Care Patient of [place], Linford Ribas, MD [facility] Primary care [facility]. The patient was screened for 30 day readmission hospitalization with noted high risk score for unplanned readmission risk with 2 IP in 6 months.  The patient was assessed for potential Care Management service needs for post hospital transition for care coordination. Review of patient's electronic medical record reveals patient was admitted for [facility] with internal right knee prosthesis. Pt will transition to [facility] for ongoing rehab at a SNF level of care. This facility will continue to address pt's needs.   Plan: [facility] Liaison will continue to follow progress and disposition to asess for post hospital community care coordination/management needs.  Referral request for community care coordination: pending disposition.   [facility] Care Management/Population Health does not replace or interfere with any arrangements made by the Inpatient Transition of Care team.   For questions contact:   Lilla Reichert, RN, BSN Hospital Liaison [place]   [facility], Population Health Office Hours MTWF  8:00 am-6:00 pm Direct Dial: [phone] mobile [email or web address]

## 2024-01-03 DIAGNOSIS — D649 Anemia, unspecified: Secondary | ICD-10-CM | POA: Diagnosis not present

## 2024-01-03 DIAGNOSIS — E559 Vitamin D deficiency, unspecified: Secondary | ICD-10-CM | POA: Diagnosis not present

## 2024-01-03 DIAGNOSIS — G4733 Obstructive sleep apnea (adult) (pediatric): Secondary | ICD-10-CM | POA: Diagnosis not present

## 2024-01-03 DIAGNOSIS — M069 Rheumatoid arthritis, unspecified: Secondary | ICD-10-CM | POA: Diagnosis not present

## 2024-01-08 ENCOUNTER — Other Ambulatory Visit: Payer: Self-pay

## 2024-01-08 ENCOUNTER — Ambulatory Visit: Admitting: Infectious Diseases

## 2024-01-08 DIAGNOSIS — T8453XD Infection and inflammatory reaction due to internal right knee prosthesis, subsequent encounter: Secondary | ICD-10-CM

## 2024-01-08 DIAGNOSIS — M0579 Rheumatoid arthritis with rheumatoid factor of multiple sites without organ or systems involvement: Secondary | ICD-10-CM

## 2024-01-08 DIAGNOSIS — Z79899 Other long term (current) drug therapy: Secondary | ICD-10-CM | POA: Insufficient documentation

## 2024-01-08 DIAGNOSIS — B952 Enterococcus as the cause of diseases classified elsewhere: Secondary | ICD-10-CM | POA: Diagnosis not present

## 2024-01-08 DIAGNOSIS — Z452 Encounter for adjustment and management of vascular access device: Secondary | ICD-10-CM | POA: Insufficient documentation

## 2024-01-08 DIAGNOSIS — S82851A Displaced trimalleolar fracture of right lower leg, initial encounter for closed fracture: Secondary | ICD-10-CM

## 2024-01-08 NOTE — Progress Notes (Unsigned)
 Patient Active Problem List   Diagnosis Date Noted  . Medication management 01/08/2024  . Obesity, Class III, BMI 40-49.9 (morbid obesity) 01/01/2024  . Right displaced trimalleolar ankle fracture with syndesmosis disruption 12/26/2023  . Fall at home, initial encounter 12/26/2023  . History of rheumatoid arthritis 12/26/2023  . Hypokalemia 12/26/2023  . Itching 12/14/2023  . Infection of prosthetic right knee joint (HCC) 12/12/2023  . IBS (irritable bowel syndrome) 11/02/2023  . IDA (iron  deficiency anemia) 06/05/2023  . Normocytic normochromic anemia 06/05/2023  . Normocytic anemia 06/01/2023  . Situational anxiety 10/15/2022  . Pressure sore of left ischial area, stage IV (HCC) 09/10/2022  . DNR (do not resuscitate) 06/21/2022  . Agatston coronary artery calcium  score greater than 400 05/25/2022  . Laryngopharyngeal reflux (LPR) 10/10/2021  . Chronic maxillary sinusitis 10/10/2021  . Prediabetes 02/14/2021  . Chronic kidney disease, stage 3b (HCC) 08/01/2020  . Post-menopausal 09/02/2019  . Essential hypertension 08/20/2018  . Recurrent urticaria 05/20/2018  . Vitamin D  deficiency 11/20/2017  . Hyperlipidemia 11/20/2017  . Raynaud's disease without gangrene 03/12/2017  . Eosinophilic esophagitis 10/12/2016  . 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 History of IBS 09/03/2016  . Autoimmune disease (HCC) 09/02/2016  . Abnormality of gait 05/09/2016  . Sjogren syndrome (HCC) 02/29/2016  . GERD (gastroesophageal reflux disease) 01/19/2016  . Glucose intolerance (impaired glucose tolerance) 01/19/2016  . Chronic diastolic CHF (congestive heart failure) (HCC) 04/20/2015  . Peripheral neuropathy 02/02/2014  . Class 2 obesity due to excess calories with body mass index (BMI) of 36.0 to 36.9 in adult 11/17/2013  . Heart murmur   . OSA (obstructive sleep apnea)   . Rheumatoid arthritis (HCC)   . Hiatal hernia   . Fibromyalgia   . History of PVC (premature ventricular  contraction)   . Unilateral primary osteoarthritis, left knee 06/22/2011    Patient's Medications  New Prescriptions   No medications on file  Previous Medications   AMPICILLIN  IVPB    Inject 2 g into the vein every 4 (four) hours for 22 days. Indication:  E faecalis PJI First Dose: Yes Last Day of Therapy:  01/24/24 Labs - Once weekly:  CBC/D and BMP, Labs - Once weekly: ESR and CRP Method of administration may be changed at the discretion of home infusion pharmacist based upon assessment of the patient and/or caregiver's ability to self-administer the medication ordered.   ASPIRIN  EC 81 MG TABLET    Take 1 tablet (81 mg total) by mouth daily. Swallow whole.   CETIRIZINE  (ZYRTEC ) 10 MG TABLET    TAKE 1 TABLET (10 MG TOTAL) BY MOUTH DAILY.   COLESTIPOL  (COLESTID ) 1 G TABLET    Take 1-2 g by mouth daily as needed (ibs flare up).   CYCLOSPORINE  (RESTASIS ) 0.05 % OPHTHALMIC EMULSION    Place 2 drops into both eyes 2 (two) times daily as needed (dry eyes).   FENOFIBRATE  (TRICOR ) 48 MG TABLET    Take 1 tablet (48 mg total) by mouth daily.   FERROUS SULFATE  325 (65 FE) MG TABLET    Take 1 tablet (325 mg total) by mouth every other day.   FOLIC ACID  (FOLVITE ) 1 MG TABLET    Take 1 tablet (1 mg total) by mouth daily.   GABAPENTIN  (NEURONTIN ) 300 MG CAPSULE    TAKE 2 CAPSULES BY MOUTH 2 TIMES DAILY.   HYDROXYCHLOROQUINE  (PLAQUENIL ) 200 MG TABLET    TAKE 1 TABLET (200 MG TOTAL) BY MOUTH DAILY.  HYOSCYAMINE  (ANASPAZ ) 0.125 MG TBDP DISINTERGRATING TABLET    Place 0.125 mg under the tongue every 6 (six) hours as needed for cramping.   LORAZEPAM  (ATIVAN ) 0.5 MG TABLET    Take 1 tablet (0.5 mg total) by mouth at bedtime as needed for sleep. At bedtime prn   METHOCARBAMOL  (ROBAXIN ) 500 MG TABLET    Take 1 tablet (500 mg total) by mouth every 6 (six) hours as needed for muscle spasms.   METOPROLOL  SUCCINATE (TOPROL -XL) 25 MG 24 HR TABLET    TAKE 1/2 TABLET BY MOUTH (12.5 MG TOTAL) DAILY   MULTIPLE VITAMIN  (MULTIVITAMIN WITH MINERALS) TABS TABLET    Take 1 tablet by mouth daily.   NONFORMULARY OR COMPOUNDED ITEM    Apply 1 application  topically daily as needed (irritation). Triamcinolone  0.1% & Silvadene cream 1:1- Apply as directed to affected areas as needed   NYSTATIN  CREAM (MYCOSTATIN )    Apply 1 Application topically daily.   OMEPRAZOLE (PRILOSEC) 40 MG CAPSULE    Take 40 mg by mouth daily as needed (acid reflux).   ONDANSETRON  (ZOFRAN ) 4 MG TABLET    Take 1 tablet (4 mg total) by mouth every 6 (six) hours as needed for nausea.   OXYCODONE  (OXY IR/ROXICODONE ) 5 MG IMMEDIATE RELEASE TABLET    Take 1-2 tablets (5-10 mg total) by mouth every 6 (six) hours as needed for severe pain (pain score 7-10).   POLYETHYLENE GLYCOL (MIRALAX ) 17 G PACKET    Take 17 g by mouth daily as needed for mild constipation.   POTASSIUM CHLORIDE  (KLOR-CON ) 10 MEQ TABLET    Take 2 tablets (20 mEq total) by mouth daily.   ROSUVASTATIN  (CRESTOR ) 10 MG TABLET    Take 1 tablet (10 mg total) by mouth daily. Please keep scheduled appointment for future refills. Thank you.   SPIRONOLACTONE  (ALDACTONE ) 25 MG TABLET    TAKE 1 TABLET (25 MG TOTAL) BY MOUTH DAILY.   TORSEMIDE  40 MG TABS    Take 40 mg by mouth daily.  Modified Medications   No medications on file  Discontinued Medications   No medications on file    Subjective: 73 year old female with prior history of basal cell carcinoma, CHF, CKD, DDD, GERD, Sjogren's syndrome, RA not on tx, Raynaud's phenomenon, HTN, HLD, obesity/OSA, OA, prediabetes, right knee arthroplasty, prolonged healing of sacral decub infection (managed with initial antibiotics and wound care) admitted who is here for HFU ( 4/30-5/3) for rt knee PJI.   She got re-admitted 5/14-5/21for fall leading to rt ankle #. She underwent ORIF on 5/17. She was seen by Ortho for her rt knee while admitted and was told her wound healing as expected.   5/27 Accompanied by her daughter. She comes from SNF. Received  getting IV ampicillin  through PICC. MAR reviewed, not on plaquenil  or any treatment for RA. She reports some hit flushes and need to pee a lot while on antibiotics. Denies urgency or burning of urine. Denies fevers and chills. Denies nausea, vomiting and diarrhea. Rt knee wound has healed. Has a fu with Ortho for rt ankle Thursday and for Rt knee with Dr Rossie Coon in June. No other concerns.   Review of Systems: all systems reviewed with pertinent positives and negatives as listed above   Past Medical History:  Diagnosis Date  . Agatston coronary artery calcium  score greater than 400 05/2022   coronary Ca score 856  . Anemia   . Back pain   . Blood transfusion    1981  .  Cancer (HCC)    basal cell carcinoma, x 3   on leg and bottom of spine  . Chronic diastolic CHF (congestive heart failure) (HCC)    diastolic   . Chronic kidney disease    CKD3a  . COVID   . DDD (degenerative disc disease), cervical   . DDD (degenerative disc disease), lumbar   . Family history of coronary arteriosclerosis- strong fam h/o CAD and early CAD.  07/17/2017  . Fibromyalgia   . GERD (gastroesophageal reflux disease)   . Heart murmur    as a child  . Hiatal hernia    sjorgens syndrome  . High blood pressure   . High cholesterol   . IBS (irritable bowel syndrome)   . OSA (obstructive sleep apnea)   . Osteoarthritis   . Peripheral autonomic neuropathy of unknown cause   . Pneumonia   . Pre-diabetes   . PVC (premature ventricular contraction)   . Raynaud disease   . Rheumatoid arthritis (HCC)   . Sjogren's disease (HCC)   . Urticaria   . Vitamin D  deficiency    Past Surgical History:  Procedure Laterality Date  . ABDOMINAL HYSTERECTOMY     BTL, BSO  . ADENOIDECTOMY    . BREAST EXCISIONAL BIOPSY Left   . BREAST SURGERY     mass removal   . CHOLECYSTECTOMY    . dental implants    . DIAGNOSTIC LAPAROSCOPY     x3  . FEMUR FRACTURE SURGERY Right   . I & D KNEE WITH POLY EXCHANGE Right 12/12/2023    Procedure: IRRIGATION AND DEBRIDEMENT KNEE WITH POLY EXCHANGE;  Surgeon: Liliane Rei, MD;  Location: [facility];  Service: Orthopedics;  Laterality: Right;  . KNEE ARTHROSCOPY     x2  . MASS EXCISION Left 03/22/2017   Procedure: EXCISION OF LEFT BREAST MASS;  Surgeon: Dareen Ebbing, MD;  Location: [facility];  Service: General;  Laterality: Left;  Aaron Aas MULTIPLE TOOTH EXTRACTIONS  06/2023   x5  . ORIF ANKLE FRACTURE Right 12/29/2023   Procedure: OPEN REDUCTION INTERNAL FIXATION (ORIF) ANKLE FRACTURE;  Surgeon: Ali Ink, MD;  Location: [facility];  Service: Orthopedics;  Laterality: Right;  . ORIF FEMUR FRACTURE Right 06/22/2022   Procedure: RIGHT OPEN REDUCTION INTERNAL FIXATION (ORIF) DISTAL FEMUR FRACTURE;  Surgeon: Hardy Lia, MD;  Location: [facility] OR;  Service: Orthopedics;  Laterality: Right;  . patotid cystectomy    . RIGHT/LEFT HEART CATH AND CORONARY ANGIOGRAPHY N/A 07/25/2019   Procedure: RIGHT/LEFT HEART CATH AND CORONARY ANGIOGRAPHY;  Surgeon: Mardell Shade, MD;  Location: [facility] INVASIVE CV LAB;  Service: Cardiovascular;  Laterality: N/A;  . TENDON REPAIR  1980   left ankle and tibia  . TONSILLECTOMY    . TOTAL KNEE ARTHROPLASTY  06/21/2011   Procedure: TOTAL KNEE ARTHROPLASTY;  Surgeon: Aurther Blue;  Location: [facility];  Service: Orthopedics;  Laterality: Right;  . TUBAL LIGATION      Social History   Tobacco Use  . Smoking status: Never    Passive exposure: Past  . Smokeless tobacco: Never  Vaping Use  . Vaping status: Never Used  Substance Use Topics  . Alcohol  use: Yes    Comment: occ  . Drug use: No    Family History  Problem Relation Age of Onset  . Alzheimer's disease Mother   . Heart attack Mother   . Hypertension Mother   . Glaucoma Mother   . High Cholesterol Mother   . Heart disease Mother   .  Depression Mother   . Cancer Father   . High Cholesterol Father   . Heart disease Father   . Sleep apnea Father   . Breast cancer Maternal Aunt   .  Heart attack Brother   . Heart disease Brother   . Glaucoma Brother   . Hyperlipidemia Brother   . Glaucoma Brother   . Asthma Son   . Allergic rhinitis Neg Hx   . Angioedema Neg Hx   . Eczema Neg Hx   . Urticaria Neg Hx   . Neuropathy Neg Hx     Allergies  Allergen Reactions  . Sulfa Antibiotics Hives and Itching    Flushing  . Cymbalta [Duloxetine Hcl] Other (See Comments)    Manic reaction  . Demerol Hives and Other (See Comments)    Fever   . Ivp Dye [Iodinated Contrast Media] Hives and Other (See Comments)    Fever  . Nsaids Other (See Comments)    CKD stage 3b - GFR 41  . Adhesive [Tape] Rash    Health Maintenance  Topic Date Due  . Colonoscopy  11/21/2022  . Medicare Annual Wellness (AWV)  12/28/2023  . COVID-19 Vaccine (8 - Pfizer risk 2024-25 season) 01/03/2024  . INFLUENZA VACCINE  03/14/2024  . MAMMOGRAM  11/05/2025  . DTaP/Tdap/Td (3 - Td or Tdap) 03/21/2028  . Pneumonia Vaccine 61+ Years old  Completed  . DEXA SCAN  Completed  . Hepatitis C Screening  Completed  . Zoster Vaccines- Shingrix   Completed  . HPV VACCINES  Aged Out  . Meningococcal B Vaccine  Aged Out    Objective: There were no vitals taken for this visit.   Physical Exam Constitutional:      Appearance: Normal appearance.  HENT:     Head: Normocephalic and atraumatic.      Mouth: Mucous membranes are moist.  Eyes:    Conjunctiva/sclera: Conjunctivae normal.     Pupils: Pupils are equal, round, and b/l symmetrical    Cardiovascular:     Rate and Rhythm: Normal rate and regular rhythm.     Heart sounds:   Pulmonary:     Effort: Pulmonary effort is normal.     Breath sounds:   Abdominal:     General: Non distended     Palpations: soft.   Musculoskeletal:        General: sitting in the wheelchair, Rt foot and ankle wrapped in a surgical bandage. Rt knee surgical site has healed with no signs of infection. Rt arm PICC OK with no signs of infection   Skin:    General: Skin  is warm and dry.     Comments:  Neurological:     General: grossly non focal     Mental Status: awake, alert and oriented to person, place, and time.   Psychiatric:        Mood and Affect: Mood normal.   Lab Results Lab Results  Component Value Date   WBC 8.2 01/02/2024   HGB 9.2 (L) 01/02/2024   HCT 29.6 (L) 01/02/2024   MCV 91.6 01/02/2024   PLT 299 01/02/2024    Lab Results  Component Value Date   CREATININE 1.08 (H) 01/02/2024   BUN 26 (H) 01/02/2024   NA 136 01/02/2024   K 4.1 01/02/2024   CL 104 01/02/2024   CO2 25 01/02/2024    Lab Results  Component Value Date   ALT 9 01/01/2024   AST 17 01/01/2024   ALKPHOS 75 01/01/2024  BILITOT 0.6 01/01/2024    Lab Results  Component Value Date   CHOL 120 10/23/2023   HDL 60 10/23/2023   LDLCALC 38 10/23/2023   TRIG 125 10/23/2023   CHOLHDL 2.0 10/23/2023   No results found for: "LABRPR", "RPRTITER" No results found for: "HIV1RNAQUANT", "HIV1RNAVL", "CD4TABS"   Assessment/Plan  I spent 60 minutes involved in face-to-face and non-face-to-face activities for this patient on the day of the visit. Professional time spent includes the following activities: Preparing to see the patient (review of tests), Obtaining and/or reviewing separately obtained history (admission/discharge record), Performing a medically appropriate examination and/or evaluation , Ordering medications, referring and communicating with other health care professionals, Documenting clinical information in the EMR, Independently interpreting results (not separately reported), Communicating results to the patient/family/caregiver, Counseling and educating the patient/family/caregiver and Care coordination (not separately reported).   Of note, portions of this note may have been created with voice recognition software. While this note has been edited for accuracy, occasional wrong-word or 'sound-a-like' substitutions may have occurred due to the inherent  limitations of voice recognition software.   Melvina Stage, MD [facility] 01/08/2024, 4:04 PM

## 2024-01-09 LAB — CBC
HCT: 34.3 % — ABNORMAL LOW (ref 35.0–45.0)
Hemoglobin: 10.8 g/dL — ABNORMAL LOW (ref 11.7–15.5)
MCH: 28.1 pg (ref 27.0–33.0)
MCHC: 31.5 g/dL — ABNORMAL LOW (ref 32.0–36.0)
MCV: 89.3 fL (ref 80.0–100.0)
MPV: 10.1 fL (ref 7.5–12.5)
Platelets: 416 10*3/uL — ABNORMAL HIGH (ref 140–400)
RBC: 3.84 10*6/uL (ref 3.80–5.10)
RDW: 14.8 % (ref 11.0–15.0)
WBC: 10.1 10*3/uL (ref 3.8–10.8)

## 2024-01-09 LAB — BASIC METABOLIC PANEL WITH GFR
BUN/Creatinine Ratio: 23 (calc) — ABNORMAL HIGH (ref 6–22)
BUN: 32 mg/dL — ABNORMAL HIGH (ref 7–25)
CO2: 28 mmol/L (ref 20–32)
Calcium: 8.7 mg/dL (ref 8.6–10.4)
Chloride: 100 mmol/L (ref 98–110)
Creat: 1.37 mg/dL — ABNORMAL HIGH (ref 0.60–1.00)
Glucose, Bld: 183 mg/dL — ABNORMAL HIGH (ref 65–99)
Potassium: 4 mmol/L (ref 3.5–5.3)
Sodium: 139 mmol/L (ref 135–146)
eGFR: 41 mL/min/{1.73_m2} — ABNORMAL LOW (ref >=60)

## 2024-01-09 LAB — C-REACTIVE PROTEIN: CRP: 42.5 mg/L — ABNORMAL HIGH (ref <8.0)

## 2024-01-10 DIAGNOSIS — S82851A Displaced trimalleolar fracture of right lower leg, initial encounter for closed fracture: Secondary | ICD-10-CM | POA: Diagnosis not present

## 2024-01-10 DIAGNOSIS — S82851D Displaced trimalleolar fracture of right lower leg, subsequent encounter for closed fracture with routine healing: Secondary | ICD-10-CM | POA: Diagnosis not present

## 2024-01-11 ENCOUNTER — Ambulatory Visit: Payer: Self-pay | Admitting: Infectious Diseases

## 2024-01-11 DIAGNOSIS — E559 Vitamin D deficiency, unspecified: Secondary | ICD-10-CM | POA: Diagnosis not present

## 2024-01-11 DIAGNOSIS — G4733 Obstructive sleep apnea (adult) (pediatric): Secondary | ICD-10-CM | POA: Diagnosis not present

## 2024-01-11 DIAGNOSIS — D649 Anemia, unspecified: Secondary | ICD-10-CM | POA: Diagnosis not present

## 2024-01-11 DIAGNOSIS — M069 Rheumatoid arthritis, unspecified: Secondary | ICD-10-CM | POA: Diagnosis not present

## 2024-01-17 ENCOUNTER — Encounter: Payer: Self-pay | Admitting: Pulmonary Disease

## 2024-01-17 ENCOUNTER — Ambulatory Visit: Admitting: Pulmonary Disease

## 2024-01-17 VITALS — BP 110/72 | HR 65 | Temp 98.7°F | Ht 61.0 in | Wt 200.0 lb

## 2024-01-17 DIAGNOSIS — R942 Abnormal results of pulmonary function studies: Secondary | ICD-10-CM | POA: Diagnosis not present

## 2024-01-17 NOTE — Patient Instructions (Signed)
  The CAT scan of your lungs did not reveal any findings to suggest interstitial lung disease/scarring in the lung  We will repeat your breathing study in July We will plan to see you after that breathing study - Expectation from the breathing study is stabilization of the diffusing capacity, more likely improvement since you have been off the amiodarone   We are not making any changes today  Call us  with any significant concerns  Will plan to see you after the PFTs sometime in July to August  You may discuss the enlarged pulmonary artery with Dr. Micael Adas -An echocardiogram can be repeated.

## 2024-01-17 NOTE — Progress Notes (Signed)
 [name]    [id number]    11/14/1950  Primary Care Physician:Olson, Linford Ribas, MD  Referring Physician: Laneta Pintos, MD [id number] [place],  [place] [address]  Chief complaint:   Patient being seen for abnormal pulmonary function test, shortness of breath on exertion of about a month  HPI:  Breathing feels relatively stable Has had 2 surgeries since her last visit  one for knee joint and then the other one following a fall-required ankle surgery  Breathing feels relatively stable  Was on amiodarone  for SVT, this was held following abnormal PFT showing isolated reduction in diffusing capacity  Never smoker No history of underlying lung disease known to her  Has a history of scoliosis  Did have a femur fracture in November 2023 that required surgery, complicated recovery with bedsores and took a while for her to recover  She does have neuropathy Has some fluid accumulation in her right knee for which she will be seen orthopedics  Elevated markers of inflammation including ESR and CRP  Outpatient Encounter Medications as of 01/17/2024  Medication Sig   ampicillin  IVPB Inject 2 g into the vein every 4 (four) hours for 22 days. Indication:  E faecalis PJI First Dose: Yes Last Day of Therapy:  01/24/24 Labs - Once weekly:  CBC/D and BMP, Labs - Once weekly: ESR and CRP Method of administration may be changed at the discretion of home infusion pharmacist based upon assessment of the patient and/or caregiver's ability to self-administer the medication ordered.   aspirin  EC 81 MG tablet Take 1 tablet (81 mg total) by mouth daily. Swallow whole.   cetirizine  (ZYRTEC ) 10 MG tablet TAKE 1 TABLET (10 MG TOTAL) BY MOUTH DAILY.   colestipol  (COLESTID ) 1 g tablet Take 1-2 g by mouth daily as needed (ibs flare up).   cycloSPORINE  (RESTASIS ) 0.05 % ophthalmic emulsion Place 2 drops into both eyes 2 (two) times daily as needed (dry eyes).   fenofibrate  (TRICOR ) 48 MG  tablet Take 1 tablet (48 mg total) by mouth daily.   ferrous sulfate  325 (65 FE) MG tablet Take 1 tablet (325 mg total) by mouth every other day.   folic acid  (FOLVITE ) 1 MG tablet Take 1 tablet (1 mg total) by mouth daily.   gabapentin  (NEURONTIN ) 300 MG capsule TAKE 2 CAPSULES BY MOUTH 2 TIMES DAILY. (Patient taking differently: Take 300-600 mg by mouth See admin instructions. Take 300 mg by mouth in the morning and 600 mg at night)   [Paused] hydroxychloroquine  (PLAQUENIL ) 200 MG tablet TAKE 1 TABLET (200 MG TOTAL) BY MOUTH DAILY. (Patient not taking: Reported on 12/26/2023)   hyoscyamine  (ANASPAZ ) 0.125 MG TBDP disintergrating tablet Place 0.125 mg under the tongue every 6 (six) hours as needed for cramping.   LORazepam  (ATIVAN ) 0.5 MG tablet Take 1 tablet (0.5 mg total) by mouth at bedtime as needed for sleep. At bedtime prn   methocarbamol  (ROBAXIN ) 500 MG tablet Take 1 tablet (500 mg total) by mouth every 6 (six) hours as needed for muscle spasms.   metoprolol  succinate (TOPROL -XL) 25 MG 24 hr tablet TAKE 1/2 TABLET BY MOUTH (12.5 MG TOTAL) DAILY (Patient taking differently: Take 12.5 mg by mouth daily.)   Multiple Vitamin (MULTIVITAMIN WITH MINERALS) TABS tablet Take 1 tablet by mouth daily. (Patient not taking: Reported on 12/26/2023)   NONFORMULARY OR COMPOUNDED ITEM Apply 1 application  topically daily as needed (irritation). Triamcinolone  0.1% & Silvadene cream 1:1-  Apply as directed to affected areas as needed   nystatin  cream (MYCOSTATIN ) Apply 1 Application topically daily.   omeprazole (PRILOSEC) 40 MG capsule Take 40 mg by mouth daily as needed (acid reflux).   ondansetron  (ZOFRAN ) 4 MG tablet Take 1 tablet (4 mg total) by mouth every 6 (six) hours as needed for nausea.   oxyCODONE  (OXY IR/ROXICODONE ) 5 MG immediate release tablet Take 1-2 tablets (5-10 mg total) by mouth every 6 (six) hours as needed for severe pain (pain score 7-10).   polyethylene glycol (MIRALAX ) 17 g packet Take 17  g by mouth daily as needed for mild constipation.   potassium chloride  (KLOR-CON ) 10 MEQ tablet Take 2 tablets (20 mEq total) by mouth daily.   rosuvastatin  (CRESTOR ) 10 MG tablet Take 1 tablet (10 mg total) by mouth daily. Please keep scheduled appointment for future refills. Thank you.   spironolactone  (ALDACTONE ) 25 MG tablet TAKE 1 TABLET (25 MG TOTAL) BY MOUTH DAILY. (Patient taking differently: Take 12.5 mg by mouth daily.)   torsemide  40 MG TABS Take 40 mg by mouth daily.   No facility-administered encounter medications on file as of 01/17/2024.    Allergies as of 01/17/2024 - Review Complete 01/17/2024  Allergen Reaction Noted   Sulfa antibiotics Hives and Itching 07/12/2017   Cymbalta [duloxetine hcl] Other (See Comments) 02/09/2016   Demerol Hives and Other (See Comments) 06/06/2011   Ivp dye [iodinated contrast media] Hives and Other (See Comments) 06/06/2011   Nsaids Other (See Comments) 12/26/2023   Adhesive [tape] Rash 06/13/2011    Past Medical History:  Diagnosis Date   Agatston coronary artery calcium  score greater than 400 05/2022   coronary Ca score 856   Anemia    Back pain    Blood transfusion    1981   Cancer (HCC)    basal cell carcinoma, x 3   on leg and bottom of spine   Chronic diastolic CHF (congestive heart failure) (HCC)    diastolic    Chronic kidney disease    CKD3a   COVID    DDD (degenerative disc disease), cervical    DDD (degenerative disc disease), lumbar    Family history of coronary arteriosclerosis- strong fam h/o CAD and early CAD.  07/17/2017   Fibromyalgia    GERD (gastroesophageal reflux disease)    Heart murmur    as a child   Hiatal hernia    sjorgens syndrome   High blood pressure    High cholesterol    IBS (irritable bowel syndrome)    OSA (obstructive sleep apnea)    Osteoarthritis    Peripheral autonomic neuropathy of unknown cause    Pneumonia    Pre-diabetes    PVC (premature ventricular contraction)    Raynaud  disease    Rheumatoid arthritis (HCC)    Sjogren's disease (HCC)    Urticaria    Vitamin D  deficiency     Past Surgical History:  Procedure Laterality Date   ABDOMINAL HYSTERECTOMY     BTL, BSO   ADENOIDECTOMY     BREAST EXCISIONAL BIOPSY Left    BREAST SURGERY     mass removal    CHOLECYSTECTOMY     dental implants     DIAGNOSTIC LAPAROSCOPY     x3   FEMUR FRACTURE SURGERY Right    I & D KNEE WITH POLY EXCHANGE Right 12/12/2023   Procedure: IRRIGATION AND DEBRIDEMENT KNEE WITH POLY EXCHANGE;  Surgeon: Liliane Rei, MD;  Location: [facility];  Service:  Orthopedics;  Laterality: Right;   KNEE ARTHROSCOPY     x2   MASS EXCISION Left 03/22/2017   Procedure: EXCISION OF LEFT BREAST MASS;  Surgeon: Dareen Ebbing, MD;  Location: [facility];  Service: General;  Laterality: Left;   MULTIPLE TOOTH EXTRACTIONS  06/2023   x5   ORIF ANKLE FRACTURE Right 12/29/2023   Procedure: OPEN REDUCTION INTERNAL FIXATION (ORIF) ANKLE FRACTURE;  Surgeon: Ali Ink, MD;  Location: [facility];  Service: Orthopedics;  Laterality: Right;   ORIF FEMUR FRACTURE Right 06/22/2022   Procedure: RIGHT OPEN REDUCTION INTERNAL FIXATION (ORIF) DISTAL FEMUR FRACTURE;  Surgeon: Hardy Lia, MD;  Location: [facility];  Service: Orthopedics;  Laterality: Right;   patotid cystectomy     RIGHT/LEFT HEART CATH AND CORONARY ANGIOGRAPHY N/A 07/25/2019   Procedure: RIGHT/LEFT HEART CATH AND CORONARY ANGIOGRAPHY;  Surgeon: Mardell Shade, MD;  Location: [facility];  Service: Cardiovascular;  Laterality: N/A;   TENDON REPAIR  1980   left ankle and tibia   TONSILLECTOMY     TOTAL KNEE ARTHROPLASTY  06/21/2011   Procedure: TOTAL KNEE ARTHROPLASTY;  Surgeon: Aurther Blue;  Location: [facility];  Service: Orthopedics;  Laterality: Right;   TUBAL LIGATION      Family History  Problem Relation Age of Onset   Alzheimer's disease Mother    Heart attack Mother    Hypertension Mother    Glaucoma Mother    High  Cholesterol Mother    Heart disease Mother    Depression Mother    Cancer Father    High Cholesterol Father    Heart disease Father    Sleep apnea Father    Breast cancer Maternal Aunt    Heart attack Brother    Heart disease Brother    Glaucoma Brother    Hyperlipidemia Brother    Glaucoma Brother    Asthma Son    Allergic rhinitis Neg Hx    Angioedema Neg Hx    Eczema Neg Hx    Urticaria Neg Hx    Neuropathy Neg Hx     Social History   Socioeconomic History   Marital status: Widowed    Spouse name: Not on file   Number of children: 2   Years of education: AS   Highest education level: Bachelor's degree (e.g., BA, AB, BS)  Occupational History   Occupation: retired Engineer, civil (consulting)   Occupation: RN  Tobacco Use   Smoking status: Never    Passive exposure: Past   Smokeless tobacco: Never  Vaping Use   Vaping status: Never Used  Substance and Sexual Activity   Alcohol  use: Yes    Comment: occ   Drug use: No   Sexual activity: Never  Other Topics Concern   Not on file  Social History Narrative   Lives at home alone   Right-handed   Drinks 1 or less cups of coffee and 2 servings of either tea or soda per day   Social Drivers of Health   Financial Resource Strain: Low Risk  (07/29/2023)   Overall Financial Resource Strain (CARDIA)    Difficulty of Paying Living Expenses: Not hard at all  Food Insecurity: No Food Insecurity (12/26/2023)   Hunger Vital Sign    Worried About Running Out of Food in the Last Year: Never true    Ran Out of Food in the Last Year: Never true  Transportation Needs: No Transportation Needs (12/26/2023)   PRAPARE - Administrator, Civil Service (Medical):  No    Lack of Transportation (Non-Medical): No  Physical Activity: Inactive (07/29/2023)   Exercise Vital Sign    Days of Exercise per Week: 0 days    Minutes of Exercise per Session: 30 min  Stress: No Stress Concern Present (07/29/2023)   [employer] of Occupational Health  - Occupational Stress Questionnaire    Feeling of Stress : Only a little  Social Connections: Moderately Integrated (12/26/2023)   Social Connection and Isolation Panel [NHANES]    Frequency of Communication with Friends and Family: More than three times a week    Frequency of Social Gatherings with Friends and Family: Three times a week    Attends Religious Services: More than 4 times per year    Active Member of Clubs or Organizations: Yes    Attends Banker Meetings: More than 4 times per year    Marital Status: Widowed  Intimate Partner Violence: Not At Risk (12/26/2023)   Humiliation, Afraid, Rape, and Kick questionnaire    Fear of Current or Ex-Partner: No    Emotionally Abused: No    Physically Abused: No    Sexually Abused: No    Review of Systems  Respiratory:  Positive for shortness of breath.   Psychiatric/Behavioral:  Negative for sleep disturbance.     There were no vitals filed for this visit.   Physical Exam Constitutional:      Appearance: She is obese.  HENT:     Nose: Nose normal.     Mouth/Throat:     Mouth: Mucous membranes are moist.  Cardiovascular:     Rate and Rhythm: Normal rate and regular rhythm.     Heart sounds: No murmur heard.    No friction rub.  Pulmonary:     Effort: No respiratory distress.     Breath sounds: No stridor. No wheezing or rhonchi.  Neurological:     Mental Status: She is alert.  Psychiatric:        Mood and Affect: Mood normal.    Data Reviewed: Elevated ESR, elevated CRP  Chest x-ray reviewed showing scoliosis, no acute infiltrate  CT scan of the chest reviewed by myself showing hypoventilated lung fields on the left  Pulmonary function test was reviewed with the patient showing isolated reduction in diffusing capacity with normal lung volumes  Echocardiogram - Normal ejection fraction, diastolic dysfunction.  Normal right ventricular systolic function  CT scan of the chest-no evidence of  interstitial lung disease, some atelectasis  Assessment:   Abnormal pulmonary function test with isolated reduction in diffusing capacity - Stopped amiodarone  - Has been doing well  Has had 2 surgeries since then which he tolerated okay  History of rheumatoid arthritis - History of polyneuropathy  History of obstructive sleep apnea - Dr. Micael Adas follows  Multifactorial shortness of breath is stable   Plan/Recommendations:  No changes  Follow-up with PFT in July  I will try to see as soon after the PFT is done  Call us  with significant concerns  2 months  May further discuss CT scan findings with Dr. Micael Adas or cardiologist regarding echo findings of enlarged pulmonary artery, last echo was normal   Myer Artis MD [facility] 01/17/2024, 11:34 AM  CC: Laneta Pintos, MD

## 2024-01-21 ENCOUNTER — Ambulatory Visit: Admitting: Infectious Diseases

## 2024-01-21 ENCOUNTER — Encounter: Payer: Self-pay | Admitting: Infectious Diseases

## 2024-01-21 ENCOUNTER — Telehealth: Payer: Self-pay

## 2024-01-21 ENCOUNTER — Other Ambulatory Visit: Payer: Self-pay

## 2024-01-21 VITALS — BP 120/72 | HR 70 | Temp 97.7°F

## 2024-01-21 DIAGNOSIS — Z79899 Other long term (current) drug therapy: Secondary | ICD-10-CM | POA: Diagnosis not present

## 2024-01-21 DIAGNOSIS — Z452 Encounter for adjustment and management of vascular access device: Secondary | ICD-10-CM | POA: Diagnosis not present

## 2024-01-21 DIAGNOSIS — T8453XD Infection and inflammatory reaction due to internal right knee prosthesis, subsequent encounter: Secondary | ICD-10-CM

## 2024-01-21 NOTE — Progress Notes (Signed)
 Patient Active Problem List   Diagnosis Date Noted  . Medication management 01/08/2024  . PICC (peripherally inserted central catheter) in place 01/08/2024  . Obesity, Class III, BMI 40-49.9 (morbid obesity) 01/01/2024  . Right displaced trimalleolar ankle fracture with syndesmosis disruption 12/26/2023  . Fall at home, initial encounter 12/26/2023  . History of rheumatoid arthritis 12/26/2023  . Hypokalemia 12/26/2023  . Itching 12/14/2023  . Infection of prosthetic right knee joint (HCC) 12/12/2023  . IBS (irritable bowel syndrome) 11/02/2023  . IDA (iron  deficiency anemia) 06/05/2023  . Normocytic normochromic anemia 06/05/2023  . Normocytic anemia 06/01/2023  . Situational anxiety 10/15/2022  . Pressure sore of left ischial area, stage IV (HCC) 09/10/2022  . DNR (do not resuscitate) 06/21/2022  . Agatston coronary artery calcium  score greater than 400 05/25/2022  . Laryngopharyngeal reflux (LPR) 10/10/2021  . Chronic maxillary sinusitis 10/10/2021  . Prediabetes 02/14/2021  . Chronic kidney disease, stage 3b (HCC) 08/01/2020  . Post-menopausal 09/02/2019  . Essential hypertension 08/20/2018  . Recurrent urticaria 05/20/2018  . Vitamin D  deficiency 11/20/2017  . Hyperlipidemia 11/20/2017  . Raynaud's disease without gangrene 03/12/2017  . Eosinophilic esophagitis 10/12/2016  . 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 History of IBS 09/03/2016  . Autoimmune disease (HCC) 09/02/2016  . Abnormality of gait 05/09/2016  . Sjogren syndrome (HCC) 02/29/2016  . GERD (gastroesophageal reflux disease) 01/19/2016  . Glucose intolerance (impaired glucose tolerance) 01/19/2016  . Chronic diastolic CHF (congestive heart failure) (HCC) 04/20/2015  . Peripheral neuropathy 02/02/2014  . Class 2 obesity due to excess calories with body mass index (BMI) of 36.0 to 36.9 in adult 11/17/2013  . Heart murmur   . OSA (obstructive sleep apnea)   . Rheumatoid arthritis (HCC)   .  Hiatal hernia   . Fibromyalgia   . History of PVC (premature ventricular contraction)   . Unilateral primary osteoarthritis, left knee 06/22/2011    Patient's Medications  New Prescriptions   No medications on file  Previous Medications   AMPICILLIN  IVPB    Inject 2 g into the vein every 4 (four) hours for 22 days. Indication:  E faecalis PJI First Dose: Yes Last Day of Therapy:  01/24/24 Labs - Once weekly:  CBC/D and BMP, Labs - Once weekly: ESR and CRP Method of administration may be changed at the discretion of home infusion pharmacist based upon assessment of the patient and/or caregiver's ability to self-administer the medication ordered.   ASPIRIN  EC 81 MG TABLET    Take 1 tablet (81 mg total) by mouth daily. Swallow whole.   CETIRIZINE  (ZYRTEC ) 10 MG TABLET    TAKE 1 TABLET (10 MG TOTAL) BY MOUTH DAILY.   COLESTIPOL  (COLESTID ) 1 G TABLET    Take 1-2 g by mouth daily as needed (ibs flare up).   CYCLOSPORINE  (RESTASIS ) 0.05 % OPHTHALMIC EMULSION    Place 2 drops into both eyes 2 (two) times daily as needed (dry eyes).   FENOFIBRATE  (TRICOR ) 48 MG TABLET    Take 1 tablet (48 mg total) by mouth daily.   FERROUS SULFATE  325 (65 FE) MG TABLET    Take 1 tablet (325 mg total) by mouth every other day.   FOLIC ACID  (FOLVITE ) 1 MG TABLET    Take 1 tablet (1 mg total) by mouth daily.   GABAPENTIN  (NEURONTIN ) 300 MG CAPSULE    TAKE 2 CAPSULES BY MOUTH 2 TIMES DAILY.   HYDROXYCHLOROQUINE  (PLAQUENIL ) 200 MG TABLET    TAKE  1 TABLET (200 MG TOTAL) BY MOUTH DAILY.   HYOSCYAMINE  (ANASPAZ ) 0.125 MG TBDP DISINTERGRATING TABLET    Place 0.125 mg under the tongue every 6 (six) hours as needed for cramping.   LORAZEPAM  (ATIVAN ) 0.5 MG TABLET    Take 1 tablet (0.5 mg total) by mouth at bedtime as needed for sleep. At bedtime prn   METHOCARBAMOL  (ROBAXIN ) 500 MG TABLET    Take 1 tablet (500 mg total) by mouth every 6 (six) hours as needed for muscle spasms.   METOPROLOL  SUCCINATE (TOPROL -XL) 25 MG 24 HR  TABLET    TAKE 1/2 TABLET BY MOUTH (12.5 MG TOTAL) DAILY   MULTIPLE VITAMIN (MULTIVITAMIN WITH MINERALS) TABS TABLET    Take 1 tablet by mouth daily.   NONFORMULARY OR COMPOUNDED ITEM    Apply 1 application  topically daily as needed (irritation). Triamcinolone  0.1% & Silvadene cream 1:1- Apply as directed to affected areas as needed   NYSTATIN  CREAM (MYCOSTATIN )    Apply 1 Application topically daily.   OMEPRAZOLE (PRILOSEC) 40 MG CAPSULE    Take 40 mg by mouth daily as needed (acid reflux).   ONDANSETRON  (ZOFRAN ) 4 MG TABLET    Take 1 tablet (4 mg total) by mouth every 6 (six) hours as needed for nausea.   OXYCODONE  (OXY IR/ROXICODONE ) 5 MG IMMEDIATE RELEASE TABLET    Take 1-2 tablets (5-10 mg total) by mouth every 6 (six) hours as needed for severe pain (pain score 7-10).   POLYETHYLENE GLYCOL (MIRALAX ) 17 G PACKET    Take 17 g by mouth daily as needed for mild constipation.   POTASSIUM CHLORIDE  (KLOR-CON ) 10 MEQ TABLET    Take 2 tablets (20 mEq total) by mouth daily.   ROSUVASTATIN  (CRESTOR ) 10 MG TABLET    Take 1 tablet (10 mg total) by mouth daily. Please keep scheduled appointment for future refills. Thank you.   SPIRONOLACTONE  (ALDACTONE ) 25 MG TABLET    TAKE 1 TABLET (25 MG TOTAL) BY MOUTH DAILY.   TORSEMIDE  40 MG TABS    Take 40 mg by mouth daily.  Modified Medications   No medications on file  Discontinued Medications   No medications on file    Subjective: 73 year old female with prior history of basal cell carcinoma, CHF, CKD, DDD, GERD, Sjogren's syndrome, RA not on tx, Raynaud's phenomenon, peripheral neuropathy, fibromyalgia, HTN, HLD, obesity/OSA, OA, prediabetes, right knee arthroplasty, prolonged healing of sacral decub infection (managed with initial antibiotics and wound care) admitted who is here for HFU ( 4/30-5/3) for rt knee PJI.   She got re-admitted 5/14-5/21 after a fall leading to rt ankle #. She underwent ORIF on 5/17. She was seen by Ortho for her rt knee while  admitted and was told her wound healing as expected.   5/27 Accompanied by her daughter. She comes from SNF. Receiving IV ampicillin  through PICC. MAR reviewed, not on plaquenil  or any treatment for RA. She reports some hot flushes and need to pee a lot while on antibiotics. Denies urgency or burning of urine. Denies fevers and chills. Denies nausea, vomiting and diarrhea. Rt knee wound has healed. Has a fu with Ortho for rt ankle Thursday and for Rt knee with Dr Rossie Coon in June. No other concerns.   6/8 Accompanied by daughter. Mild redness at PICC insertion site but no other concerns. Rt knee wound has healed. No seen Alusio yet. Saw ortho for rt ankle. Keep rt knee covered. Labs drawn from today and Monday last wee,k.  Review of Systems: all systems reviewed with pertinent positives and negatives as listed above   Past Medical History:  Diagnosis Date  . Agatston coronary artery calcium  score greater than 400 05/2022   coronary Ca score 856  . Anemia   . Back pain   . Blood transfusion    1981  . Cancer (HCC)    basal cell carcinoma, x 3   on leg and bottom of spine  . Chronic diastolic CHF (congestive heart failure) (HCC)    diastolic   . Chronic kidney disease    CKD3a  . COVID   . DDD (degenerative disc disease), cervical   . DDD (degenerative disc disease), lumbar   . Family history of coronary arteriosclerosis- strong fam h/o CAD and early CAD.  07/17/2017  . Fibromyalgia   . GERD (gastroesophageal reflux disease)   . Heart murmur    as a child  . Hiatal hernia    sjorgens syndrome  . High blood pressure   . High cholesterol   . IBS (irritable bowel syndrome)   . OSA (obstructive sleep apnea)   . Osteoarthritis   . Peripheral autonomic neuropathy of unknown cause   . Pneumonia   . Pre-diabetes   . PVC (premature ventricular contraction)   . Raynaud disease   . Rheumatoid arthritis (HCC)   . Sjogren's disease (HCC)   . Urticaria   . Vitamin D  deficiency    Past  Surgical History:  Procedure Laterality Date  . ABDOMINAL HYSTERECTOMY     BTL, BSO  . ADENOIDECTOMY    . BREAST EXCISIONAL BIOPSY Left   . BREAST SURGERY     mass removal   . CHOLECYSTECTOMY    . dental implants    . DIAGNOSTIC LAPAROSCOPY     x3  . FEMUR FRACTURE SURGERY Right   . I & D KNEE WITH POLY EXCHANGE Right 12/12/2023   Procedure: IRRIGATION AND DEBRIDEMENT KNEE WITH POLY EXCHANGE;  Surgeon: Liliane Rei, MD;  Location: [facility];  Service: Orthopedics;  Laterality: Right;  . KNEE ARTHROSCOPY     x2  . MASS EXCISION Left 03/22/2017   Procedure: EXCISION OF LEFT BREAST MASS;  Surgeon: Dareen Ebbing, MD;  Location: [facility];  Service: General;  Laterality: Left;  Aaron Aas MULTIPLE TOOTH EXTRACTIONS  06/2023   x5  . ORIF ANKLE FRACTURE Right 12/29/2023   Procedure: OPEN REDUCTION INTERNAL FIXATION (ORIF) ANKLE FRACTURE;  Surgeon: Ali Ink, MD;  Location: [facility];  Service: Orthopedics;  Laterality: Right;  . ORIF FEMUR FRACTURE Right 06/22/2022   Procedure: RIGHT OPEN REDUCTION INTERNAL FIXATION (ORIF) DISTAL FEMUR FRACTURE;  Surgeon: Hardy Lia, MD;  Location: [facility];  Service: Orthopedics;  Laterality: Right;  . patotid cystectomy    . RIGHT/LEFT HEART CATH AND CORONARY ANGIOGRAPHY N/A 07/25/2019   Procedure: RIGHT/LEFT HEART CATH AND CORONARY ANGIOGRAPHY;  Surgeon: Mardell Shade, MD;  Location: [facility];  Service: Cardiovascular;  Laterality: N/A;  . TENDON REPAIR  1980   left ankle and tibia  . TONSILLECTOMY    . TOTAL KNEE ARTHROPLASTY  06/21/2011   Procedure: TOTAL KNEE ARTHROPLASTY;  Surgeon: Aurther Blue;  Location: [facility];  Service: Orthopedics;  Laterality: Right;  . TUBAL LIGATION      Social History   Tobacco Use  . Smoking status: Never    Passive exposure: Past  . Smokeless tobacco: Never  Vaping Use  . Vaping status: Never Used  Substance Use Topics  . Alcohol   use: Yes    Comment: occ  . Drug use: No    Family History   Problem Relation Age of Onset  . Alzheimer's disease Mother   . Heart attack Mother   . Hypertension Mother   . Glaucoma Mother   . High Cholesterol Mother   . Heart disease Mother   . Depression Mother   . Cancer Father   . High Cholesterol Father   . Heart disease Father   . Sleep apnea Father   . Breast cancer Maternal Aunt   . Heart attack Brother   . Heart disease Brother   . Glaucoma Brother   . Hyperlipidemia Brother   . Glaucoma Brother   . Asthma Son   . Allergic rhinitis Neg Hx   . Angioedema Neg Hx   . Eczema Neg Hx   . Urticaria Neg Hx   . Neuropathy Neg Hx     Allergies  Allergen Reactions  . Sulfa Antibiotics Hives and Itching    Flushing  . Cymbalta [Duloxetine Hcl] Other (See Comments)    Manic reaction  . Demerol Hives and Other (See Comments)    Fever   . Ivp Dye [Iodinated Contrast Media] Hives and Other (See Comments)    Fever  . Nsaids Other (See Comments)    CKD stage 3b - GFR 41  . Adhesive [Tape] Rash    Health Maintenance  Topic Date Due  . Colonoscopy  11/21/2022  . Medicare Annual Wellness (AWV)  12/28/2023  . COVID-19 Vaccine (8 - Pfizer risk 2024-25 season) 01/03/2024  . INFLUENZA VACCINE  03/14/2024  . MAMMOGRAM  11/05/2025  . DTaP/Tdap/Td (3 - Td or Tdap) 03/21/2028  . Pneumonia Vaccine 50+ Years old  Completed  . DEXA SCAN  Completed  . Hepatitis C Screening  Completed  . Zoster Vaccines- Shingrix   Completed  . HPV VACCINES  Aged Out  . Meningococcal B Vaccine  Aged Out    Objective: There were no vitals taken for this visit.  Physical Exam Constitutional:      Appearance: Normal appearance.  HENT:     Head: Normocephalic and atraumatic.      Mouth: Mucous membranes are moist.  Eyes:    Conjunctiva/sclera: Conjunctivae normal.     Pupils: Pupils are equal, round, and b/l symmetrical    Cardiovascular:     Rate and Rhythm: Normal rate and regular rhythm.     Heart sounds:   Pulmonary:     Effort: Pulmonary  effort is normal.     Breath sounds:   Abdominal:     General: Non distended     Palpations: soft.   Musculoskeletal:        General: sitting in the wheelchair, Rt foot and ankle wrapped in a surgical bandage. Rt knee surgical site has healed with no signs of infection. Rt arm PICC OK with no signs of infection   Skin:    General: Skin is warm and dry.     Comments:  Neurological:     General: grossly non focal     Mental Status: awake, alert and oriented to person, place, and time.   Psychiatric:        Mood and Affect: Mood normal.   Lab Results Lab Results  Component Value Date   WBC 10.1 01/08/2024   HGB 10.8 (L) 01/08/2024   HCT 34.3 (L) 01/08/2024   MCV 89.3 01/08/2024   PLT 416 (H) 01/08/2024    Lab Results  Component  Value Date   CREATININE 1.37 (H) 01/08/2024   BUN 32 (H) 01/08/2024   NA 139 01/08/2024   K 4.0 01/08/2024   CL 100 01/08/2024   CO2 28 01/08/2024    Lab Results  Component Value Date   ALT 9 01/01/2024   AST 17 01/01/2024   ALKPHOS 75 01/01/2024   BILITOT 0.6 01/01/2024    Lab Results  Component Value Date   CHOL 120 10/23/2023   HDL 60 10/23/2023   LDLCALC 38 10/23/2023   TRIG 125 10/23/2023   CHOLHDL 2.0 10/23/2023   No results found for: "LABRPR", "RPRTITER" No results found for: "HIV1RNAQUANT", "HIV1RNAVL", "CD4TABS"   Assessment/Plan # Right knee PJI - 3/28 aspiration in Ortho clinic with E faecalis - 4/30 I&D with liner exchange.  Or culture with E faecalis - No antibiotics prior to I&D  Plan - complete 6 weeks of IV ampicillin  through 6/12, plan to switch to PO amoxicillin  1g po tid starting 6/13 - fu in 2 weeks prior to EOT - fu in Orthopedics Dr Rossie Coon as instructed  # Rt ankle #  - s/p ORIF on 5/17  - fu with Dr Cherl Corner  # Medication management  - 5/21 BMP and CBC unremarkable  - 5/5 ESR 61 - 5/12 ESR 95, CRP 32 - 5/14 ESR 67, CRP 4.1 - Labs today, no labs from facility   # PICC  - no concerns   # RA   - not on any treatment per patient report and MAR  - Fu with Rheumatology   I spent 35 minutes involved in face-to-face and non-face-to-face activities for this patient on the day of the visit. Professional time spent includes the following activities: Preparing to see the patient (review of tests), Obtaining and reviewing separately obtained history (admission/discharge record from may 14, 21, 2025), Performing a medically appropriate examination and evaluation , Ordering lab, Documenting clinical information in the EMR, Independently interpreting results (not separately reported), Communicating results to the patient, Counseling and educating the patient and Care coordination (not separately reported).   Of note, portions of this note may have been created with voice recognition software. While this note has been edited for accuracy, occasional wrong-word or 'sound-a-like' substitutions may have occurred due to the inherent limitations of voice recognition software.   Melvina Stage, MD [facility] 01/21/2024, 11:41 AM

## 2024-01-21 NOTE — Telephone Encounter (Signed)
 Called [facility] [phone]) to have them fax over a copy of patients labs. No answer left message on nursing supervisor vm to call back.

## 2024-01-23 ENCOUNTER — Ambulatory Visit: Admitting: Family Medicine

## 2024-01-24 DIAGNOSIS — S82851D Displaced trimalleolar fracture of right lower leg, subsequent encounter for closed fracture with routine healing: Secondary | ICD-10-CM | POA: Diagnosis not present

## 2024-02-01 ENCOUNTER — Inpatient Hospital Stay: Attending: Oncology

## 2024-02-01 ENCOUNTER — Inpatient Hospital Stay: Admitting: Oncology

## 2024-02-01 ENCOUNTER — Encounter: Payer: Self-pay | Admitting: Oncology

## 2024-02-01 DIAGNOSIS — M0579 Rheumatoid arthritis with rheumatoid factor of multiple sites without organ or systems involvement: Secondary | ICD-10-CM

## 2024-02-01 DIAGNOSIS — M069 Rheumatoid arthritis, unspecified: Secondary | ICD-10-CM | POA: Diagnosis not present

## 2024-02-01 DIAGNOSIS — N1832 Chronic kidney disease, stage 3b: Secondary | ICD-10-CM | POA: Insufficient documentation

## 2024-02-01 DIAGNOSIS — E611 Iron deficiency: Secondary | ICD-10-CM | POA: Diagnosis not present

## 2024-02-01 DIAGNOSIS — D649 Anemia, unspecified: Secondary | ICD-10-CM | POA: Insufficient documentation

## 2024-02-01 DIAGNOSIS — L89324 Pressure ulcer of left buttock, stage 4: Secondary | ICD-10-CM | POA: Diagnosis not present

## 2024-02-01 LAB — CMP (CANCER CENTER ONLY)
ALT: 8 U/L (ref 0–44)
AST: 14 U/L — ABNORMAL LOW (ref 15–41)
Albumin: 4 g/dL (ref 3.5–5.0)
Alkaline Phosphatase: 99 U/L (ref 38–126)
Anion gap: 12 (ref 5–15)
BUN: 43 mg/dL — ABNORMAL HIGH (ref 8–23)
CO2: 20 mmol/L — ABNORMAL LOW (ref 22–32)
Calcium: 9.4 mg/dL (ref 8.9–10.3)
Chloride: 106 mmol/L (ref 98–111)
Creatinine: 1.36 mg/dL — ABNORMAL HIGH (ref 0.44–1.00)
GFR, Estimated: 42 mL/min — ABNORMAL LOW (ref >60)
Glucose, Bld: 110 mg/dL — ABNORMAL HIGH (ref 70–99)
Potassium: 4.4 mmol/L (ref 3.5–5.1)
Sodium: 138 mmol/L (ref 135–145)
Total Bilirubin: 0.3 mg/dL (ref 0.0–1.2)
Total Protein: 7.8 g/dL (ref 6.5–8.1)

## 2024-02-01 LAB — CBC WITH DIFFERENTIAL (CANCER CENTER ONLY)
Abs Immature Granulocytes: 0.06 10*3/uL (ref 0.00–0.07)
Basophils Absolute: 0.1 10*3/uL (ref 0.0–0.1)
Basophils Relative: 1 %
Eosinophils Absolute: 0.3 10*3/uL (ref 0.0–0.5)
Eosinophils Relative: 4 %
HCT: 38.2 % (ref 36.0–46.0)
Hemoglobin: 12.4 g/dL (ref 12.0–15.0)
Immature Granulocytes: 1 %
Lymphocytes Relative: 18 %
Lymphs Abs: 1.3 10*3/uL (ref 0.7–4.0)
MCH: 28.1 pg (ref 26.0–34.0)
MCHC: 32.5 g/dL (ref 30.0–36.0)
MCV: 86.6 fL (ref 80.0–100.0)
Monocytes Absolute: 0.7 10*3/uL (ref 0.1–1.0)
Monocytes Relative: 10 %
Neutro Abs: 4.9 10*3/uL (ref 1.7–7.7)
Neutrophils Relative %: 66 %
Platelet Count: 240 10*3/uL (ref 150–400)
RBC: 4.41 MIL/uL (ref 3.87–5.11)
RDW: 15.7 % — ABNORMAL HIGH (ref 11.5–15.5)
WBC Count: 7.3 10*3/uL (ref 4.0–10.5)
nRBC: 0 % (ref 0.0–0.2)

## 2024-02-01 LAB — FOLATE: Folate: >40 ng/mL (ref >5.9)

## 2024-02-01 LAB — IRON AND IRON BINDING CAPACITY (CC-WL,HP ONLY)
Iron: 60 ug/dL (ref 28–170)
Saturation Ratios: 20 % (ref 10.4–31.8)
TIBC: 307 ug/dL (ref 250–450)
UIBC: 247 ug/dL (ref 148–442)

## 2024-02-01 LAB — FERRITIN: Ferritin: 416 ng/mL — ABNORMAL HIGH (ref 11–307)

## 2024-02-01 NOTE — Assessment & Plan Note (Addendum)
 Diagnosed in the 1990s, currently off all RA medications except for ibuprofen  due to concerns about wound healing.   Reports flare-ups with joint swelling and pain, particularly in wrists and fingers. Plaquenil  was previously held to avoid impairing healing.

## 2024-02-01 NOTE — Progress Notes (Signed)
 [facility]  HEMATOLOGY CLINIC PROGRESS NOTE  PATIENT NAME: [name]   MR#: [id number] DOB: 1950-09-11  Patient Care Team: Laneta Pintos, MD as PCP - General (Family Medicine) Nicholas Bari, MD as Consulting Physician (Rheumatology) Jacqueline Matsu, MD as Consulting Physician (Cardiology) Liliane Rei, MD as Consulting Physician (Orthopedic Surgery) Brian Campanile, MD (Inactive) as Consulting Physician (Neurology) Tami Falcon, MD as Consulting Physician (Gastroenterology) [place], Amy, MD as Consulting Physician (Dermatology) Nicolas Barren, MD as Consulting Physician (Nephrology) Bobbitt, Colen Daunt, MD as Consulting Physician (Allergy  and Immunology) Tita Form, MD as Consulting Physician (Internal Medicine)  Date of visit: 02/01/2024   ASSESSMENT & PLAN:   [name] is a 73 y.o. 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    Normocytic anemia New onset since November 2023, possibly secondary to surgery. No active bleeding noted. Possible contributing factors include chronic non-healing wound and rheumatoid arthritis. No signs of blood loss from GI tract or other sources. -On her initial visit with us  on 06/01/2023, labs showed hemoglobin of 9.5, hematocrit 31.5, MCV 83.1.  White count 6800 with normal differential.  Platelet count normal at 289,000.  CMP showed BUN of 40, creatinine 1.26, normal LFTs.  B12, LDH normal.  Folate low at 5.8.  Iron  saturation decreased at 9%.  Ferritin normal.  Workup for monoclonal gammopathy came back negative.  Started her on folic acid  1 mg daily.  Also given mild iron  deficiency as noted by decreased iron  saturation, she was treated with IV iron  using Venofer  x 5 doses.  She has maintained on oral iron  supplements and folic acid  daily and tolerating this  well.  Hemoglobin is currently 12.4, normal.  Normal MCV.  White count and platelet count are also within normal limits.  She was advised to continue iron  and folic acid  supplements.  Hx of Pressure sore of left ischial area, stage IV- healed as of February 2025 This is completely healed.  She is off of wound VAC.   Rheumatoid arthritis (HCC) Diagnosed in the 1990s, currently off all RA medications except for ibuprofen  due to concerns about wound healing.   Reports flare-ups with joint swelling and pain, particularly in wrists and fingers. Plaquenil  was previously held to avoid impairing healing.   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 Currently on oral amoxicillin  3000 mg daily after completing IV antibiotics via PICC line. Duration of antibiotic therapy may range from six months to life, depending on clinical response and laboratory markers. 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 Fluctuations in creatinine and GFR may be influenced by hydration status and medications.  Fracture of ankle Recent fracture of the ankle in three places, currently in the second cast. Undergoing a 10-12 week non-weight-bearing recovery process. X-rays will be performed to assess healing progress. - Perform follow-up x-rays to assess healing progress  I spent a total of 20 minutes during this encounter with the patient including review of chart and various tests results, discussions about plan of care and coordination of care plan.  I reviewed lab results and outside records for this visit and discussed relevant results with the patient. Diagnosis, plan of care and treatment  options were also discussed in detail with the patient. Opportunity provided to ask questions and answers provided to her apparent  satisfaction. Provided instructions to call our clinic with any problems, questions or concerns prior to return visit. I recommended to continue follow-up with PCP and sub-specialists. She verbalized understanding and agreed with the plan. No barriers to learning was detected.  Marisa Berber, MD  02/01/2024 5:02 PM  [facility] - A DEPT OF [facility] [address] [place] [address] Dept: [phone] Dept Fax: [phone]   CHIEF COMPLAINT/ REASON FOR VISIT:  Follow-up for normocytic anemia.  Previously noted to have iron  deficiency and folic acid  deficiency.  Treated with IV iron  in October-November 2024.  INTERVAL HISTORY:  Discussed the use of AI scribe software for clinical note transcription with the patient, who gave verbal consent to proceed.  History of Present Illness [name] is a 73 year old female who presents for follow-up of her hematological status.  She recently experienced an Enterococcus infection that required surgical intervention on her knee, involving a procedure to clean and replace parts of her knee. Two weeks post-surgery, she suffered a fall resulting in a trimalleolar fracture of her ankle. She has been in a cast for approximately five weeks, with an expected total immobilization period of ten to twelve weeks. She is currently on her second cast and is awaiting further evaluation with x-rays.  She was initially on IV antibiotics via a PICC line and is now taking 3000 mg of oral amoxicillin  daily. The infectious disease team has indicated that she may need to continue this regimen for an extended period, depending on her response to treatment.  Her hemoglobin levels dropped to the 7s during her last hospitalization related to her ankle injury, necessitating a blood transfusion. Her hemoglobin is currently stable at 12.4 g/dL. She is taking iron  supplements every other day, which has helped  maintain her iron  levels. Her white blood cell count and platelet count are normal. She is also taking folic acid  daily, and her folic acid  levels are pending.  Her creatinine levels have fluctuated, with a current level of 1.36 mg/dL. She has stage 3B chronic kidney disease with a GFR of 42 mL/min/1.62m, which has been variable. Her creatinine has been as high as 1.42 mg/dL in the past.  She is not currently on treatment for rheumatoid arthritis due to concerns about medication interference with healing. She experiences significant flare-ups, particularly in her wrists and fingers, with noticeable swelling.     SUMMARY OF HEMATOLOGIC HISTORY:  73 y.o. 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  The patient reports feeling fatigued and cold, which are common symptoms of anemia. The patient's anemia was first noticed in November 2023 following surgery for a fracture. The patient's hemoglobin levels have not returned to normal since the surgery. The patient also reports some issues with feeling full quickly after eating. The patient's RA medications were stopped due to concerns about her wound healing.   On 05/17/2023, labs showed hemoglobin of 9.7, hematocrit 32.3, MCV 82.  White count, platelet count were normal.  Given persistent anemia, referral was sent to us  for further evaluation.   She had not noticed any recent bleeding such as epistaxis, hematuria or hematochezia   She had  no prior history or diagnosis of cancer. Her age appropriate screening programs are up-to-date.   On her initial visit with us  on 06/01/2023, labs showed hemoglobin of 9.5, hematocrit 31.5, MCV 83.1.  White count 6800 with normal differential.  Platelet count normal at 289,000.  CMP showed BUN of 40, creatinine 1.26, normal LFTs.  B12, LDH normal.  Folate low at 5.8.  Iron   saturation decreased at 9%.  Ferritin normal.  Workup for monoclonal gammopathy came back negative.   Started her on folic acid  1 mg daily.   Also given mild iron  deficiency as noted by decreased iron  saturation, she was treated with IV iron  with Venofer  200 mg x 5 doses given weekly.  Patient later had Enterococcus infection, knee surgery, ankle fracture and multiple other related complications.  Currently in rehab.  Completed IV antibiotic course and is currently on oral antibiotics.    I have reviewed the past medical history, past surgical history, social history and family history with the patient and they are unchanged from previous note.  ALLERGIES: She is allergic to sulfa antibiotics, cymbalta [duloxetine hcl], demerol, ivp dye [iodinated contrast media], nsaids, and adhesive [tape].  MEDICATIONS:  Current Outpatient Medications  Medication Sig Dispense Refill   aspirin  EC 81 MG tablet Take 1 tablet (81 mg total) by mouth daily. Swallow whole. 30 tablet 12   cetirizine  (ZYRTEC ) 10 MG tablet TAKE 1 TABLET (10 MG TOTAL) BY MOUTH DAILY. 90 tablet 1   colestipol  (COLESTID ) 1 g tablet Take 1-2 g by mouth daily as needed (ibs flare up).     cycloSPORINE  (RESTASIS ) 0.05 % ophthalmic emulsion Place 2 drops into both eyes 2 (two) times daily as needed (dry eyes).     fenofibrate  (TRICOR ) 48 MG tablet Take 1 tablet (48 mg total) by mouth daily. 90 tablet 0   ferrous sulfate  325 (65 FE) MG tablet Take 1 tablet (325 mg total) by mouth every other day. 14 tablet 0   folic acid  (FOLVITE ) 1 MG tablet Take 1 tablet (1 mg total) by mouth daily. 30 tablet 5   gabapentin  (NEURONTIN ) 300 MG capsule TAKE 2 CAPSULES BY MOUTH 2 TIMES DAILY. (Patient taking differently: Take 300-600 mg by mouth See admin instructions. Take 300 mg by mouth in the morning and 600 mg at night) 360 capsule 1   [Paused] hydroxychloroquine  (PLAQUENIL ) 200 MG tablet TAKE 1 TABLET (200 MG TOTAL) BY MOUTH DAILY. 90 tablet 0    hyoscyamine  (ANASPAZ ) 0.125 MG TBDP disintergrating tablet Place 0.125 mg under the tongue every 6 (six) hours as needed for cramping.     LORazepam  (ATIVAN ) 0.5 MG tablet Take 1 tablet (0.5 mg total) by mouth at bedtime as needed for sleep. At bedtime prn 3 tablet 0   methocarbamol  (ROBAXIN ) 500 MG tablet Take 1 tablet (500 mg total) by mouth every 6 (six) hours as needed for muscle spasms. 40 tablet 0   metoprolol  succinate (TOPROL -XL) 25 MG 24 hr tablet TAKE 1/2 TABLET BY MOUTH (12.5 MG TOTAL) DAILY (Patient taking differently: Take 12.5 mg by mouth daily.) 45 tablet 3   Multiple Vitamin (MULTIVITAMIN WITH MINERALS) TABS tablet Take 1 tablet by mouth daily.     NONFORMULARY OR COMPOUNDED ITEM Apply 1 application  topically daily as needed (irritation). Triamcinolone  0.1% & Silvadene cream 1:1- Apply as directed to affected areas as needed     nystatin  cream (MYCOSTATIN ) Apply 1 Application topically daily.     omeprazole (PRILOSEC) 40 MG capsule Take 40 mg  by mouth daily as needed (acid reflux).     ondansetron  (ZOFRAN ) 4 MG tablet Take 1 tablet (4 mg total) by mouth every 6 (six) hours as needed for nausea. 20 tablet 0   polyethylene glycol (MIRALAX ) 17 g packet Take 17 g by mouth daily as needed for mild constipation.     potassium chloride  (KLOR-CON ) 10 MEQ tablet Take 2 tablets (20 mEq total) by mouth daily. 180 tablet 3   rosuvastatin  (CRESTOR ) 10 MG tablet Take 1 tablet (10 mg total) by mouth daily. Please keep scheduled appointment for future refills. Thank you. 30 tablet 1   spironolactone  (ALDACTONE ) 25 MG tablet TAKE 1 TABLET (25 MG TOTAL) BY MOUTH DAILY. (Patient taking differently: Take 12.5 mg by mouth daily.) 90 tablet 0   torsemide  40 MG TABS Take 40 mg by mouth daily. 30 tablet 0   No current facility-administered medications for this visit.     REVIEW OF SYSTEMS:    Review of Systems - Oncology  All other pertinent systems were reviewed with the patient and are  negative.  PHYSICAL EXAMINATION:   Onc Performance Status - 02/01/24 1531       ECOG Perf Status   ECOG Perf Status Capable of only limited selfcare, confined to bed or chair more than 50% of waking hours      KPS SCALE   KPS % SCORE Disabled, requires special care and assistance          There were no vitals filed for this visit. There were no vitals filed for this visit.  Physical Exam Constitutional:      General: She is not in acute distress.    Appearance: Normal appearance.  HENT:     Head: Normocephalic and atraumatic.   Cardiovascular:     Rate and Rhythm: Normal rate.  Pulmonary:     Effort: Pulmonary effort is normal. No respiratory distress.  Abdominal:     General: There is no distension.   Musculoskeletal:     Comments: Right lower extremity is in a cast.  Scars from right knee surgery are well-healed.   Neurological:     Mental Status: She is alert and oriented to person, place, and time.   Psychiatric:        Mood and Affect: Mood normal.        Behavior: Behavior normal.     LABORATORY DATA:   I have reviewed the data as listed.  Results for orders placed or performed in visit on 02/01/24  CBC with Differential (Cancer Center Only)  Result Value Ref Range   WBC Count 7.3 4.0 - 10.5 K/uL   RBC 4.41 3.87 - 5.11 MIL/uL   Hemoglobin 12.4 12.0 - 15.0 g/dL   HCT 16.1 09.6 - 04.5 %   MCV 86.6 80.0 - 100.0 fL   MCH 28.1 26.0 - 34.0 pg   MCHC 32.5 30.0 - 36.0 g/dL   RDW 40.9 (H) 81.1 - 91.4 %   Platelet Count 240 150 - 400 K/uL   nRBC 0.0 0.0 - 0.2 %   Neutrophils Relative % 66 %   Neutro Abs 4.9 1.7 - 7.7 K/uL   Lymphocytes Relative 18 %   Lymphs Abs 1.3 0.7 - 4.0 K/uL   Monocytes Relative 10 %   Monocytes Absolute 0.7 0.1 - 1.0 K/uL   Eosinophils Relative 4 %   Eosinophils Absolute 0.3 0.0 - 0.5 K/uL   Basophils Relative 1 %   Basophils Absolute 0.1 0.0 -  0.1 K/uL   Immature Granulocytes 1 %   Abs Immature Granulocytes 0.06 0.00 -  0.07 K/uL  CMP (Cancer Center only)  Result Value Ref Range   Sodium 138 135 - 145 mmol/L   Potassium 4.4 3.5 - 5.1 mmol/L   Chloride 106 98 - 111 mmol/L   CO2 20 (L) 22 - 32 mmol/L   Glucose, Bld 110 (H) 70 - 99 mg/dL   BUN 43 (H) 8 - 23 mg/dL   Creatinine 1.61 (H) 0.96 - 1.00 mg/dL   Calcium  9.4 8.9 - 10.3 mg/dL   Total Protein 7.8 6.5 - 8.1 g/dL   Albumin 4.0 3.5 - 5.0 g/dL   AST 14 (L) 15 - 41 U/L   ALT 8 0 - 44 U/L   Alkaline Phosphatase 99 38 - 126 U/L   Total Bilirubin 0.3 0.0 - 1.2 mg/dL   GFR, Estimated 42 (L) >60 mL/min   Anion gap 12 5 - 15  Iron  and Iron  Binding Capacity ([facility] only)  Result Value Ref Range   Iron  60 28 - 170 ug/dL   TIBC 045 409 - 811 ug/dL   Saturation Ratios 20 10.4 - 31.8 %   UIBC 247 148 - 442 ug/dL     RADIOGRAPHIC STUDIES:  No recent pertinent imaging studies available to review.  Orders Placed This Encounter  Procedures   CBC with Differential (Cancer Center Only)    Standing Status:   Future    Expected Date:   06/02/2024    Expiration Date:   08/31/2024   CMP (Cancer Center only)    Standing Status:   Future    Expected Date:   06/02/2024    Expiration Date:   08/31/2024   Iron  and Iron  Binding Capacity ([facility] only)    Standing Status:   Future    Expected Date:   06/02/2024    Expiration Date:   08/31/2024   Ferritin    Standing Status:   Future    Expected Date:   06/02/2024    Expiration Date:   08/31/2024   Folate    Standing Status:   Future    Expected Date:   06/02/2024    Expiration Date:   08/31/2024     Future Appointments  Date Time Provider Department Center  02/19/2024 11:00 AM [facility] PFT ROOM [facility] None  02/26/2024  1:45 PM Terre Ferri, MD [facility]  03/03/2024 11:45 AM Tammie Fall, MD [facility]-MAGST H&V  03/24/2024  3:00 PM Phebe Brasil, MD [facility]NA None  04/10/2024  1:10 PM Romayne Clubs, PA-C [facility]-GSO None  05/29/2024  3:00 PM PCFO-ANNUAL WELLNESS VISIT PCFO-PCFO None  06/02/2024  11:00 AM [facility] LAB [facility] None  06/02/2024 11:40 AM Ander Bame, MD [facility] None     This document was completed utilizing speech recognition software. Grammatical errors, random word insertions, pronoun errors, and incomplete sentences are an occasional consequence of this system due to software limitations, ambient noise, and hardware issues. Any formal questions or concerns about the content, text or information contained within the body of this dictation should be directly addressed to the provider for clarification.

## 2024-02-01 NOTE — Assessment & Plan Note (Addendum)
 New onset since November 2023, possibly secondary to surgery. No active bleeding noted. Possible contributing factors include chronic non-healing wound and rheumatoid arthritis. No signs of blood loss from GI tract or other sources. -On her initial visit with us  on 06/01/2023, labs showed hemoglobin of 9.5, hematocrit 31.5, MCV 83.1.  White count 6800 with normal differential.  Platelet count normal at 289,000.  CMP showed BUN of 40, creatinine 1.26, normal LFTs.  B12, LDH normal.  Folate low at 5.8.  Iron  saturation decreased at 9%.  Ferritin normal.  Workup for monoclonal gammopathy came back negative.  Started her on folic acid  1 mg daily.  Also given mild iron  deficiency as noted by decreased iron  saturation, she was treated with IV iron  using Venofer  x 5 doses.  She has maintained on oral iron  supplements and folic acid  daily and tolerating this well.  Hemoglobin is currently 12.4, normal.  Normal MCV.  White count and platelet count are also within normal limits.  She was advised to continue iron  and folic acid  supplements.

## 2024-02-01 NOTE — Assessment & Plan Note (Addendum)
 This is completely healed.  She is off of wound VAC.

## 2024-02-14 DIAGNOSIS — S82851D Displaced trimalleolar fracture of right lower leg, subsequent encounter for closed fracture with routine healing: Secondary | ICD-10-CM | POA: Diagnosis not present

## 2024-02-18 DIAGNOSIS — R52 Pain, unspecified: Secondary | ICD-10-CM | POA: Diagnosis not present

## 2024-02-18 DIAGNOSIS — I10 Essential (primary) hypertension: Secondary | ICD-10-CM | POA: Diagnosis not present

## 2024-02-18 DIAGNOSIS — K5900 Constipation, unspecified: Secondary | ICD-10-CM | POA: Diagnosis not present

## 2024-02-18 DIAGNOSIS — E785 Hyperlipidemia, unspecified: Secondary | ICD-10-CM | POA: Diagnosis not present

## 2024-02-18 DIAGNOSIS — K2100 Gastro-esophageal reflux disease with esophagitis, without bleeding: Secondary | ICD-10-CM | POA: Diagnosis not present

## 2024-02-19 ENCOUNTER — Encounter

## 2024-02-26 ENCOUNTER — Ambulatory Visit: Admitting: Infectious Diseases

## 2024-03-03 ENCOUNTER — Ambulatory Visit: Attending: Internal Medicine | Admitting: Internal Medicine

## 2024-03-03 ENCOUNTER — Ambulatory Visit

## 2024-03-03 ENCOUNTER — Encounter: Payer: Self-pay | Admitting: Internal Medicine

## 2024-03-03 VITALS — BP 98/62 | HR 71 | Ht 62.5 in | Wt 212.0 lb

## 2024-03-03 DIAGNOSIS — I493 Ventricular premature depolarization: Secondary | ICD-10-CM

## 2024-03-03 NOTE — Progress Notes (Unsigned)
 Enrolled for [employer] to mail a ZIO XT long term holter monitor to the patients address on file.

## 2024-03-03 NOTE — Patient Instructions (Addendum)
 Medication Instructions:  Your physician recommends that you continue on your current medications as directed. Please refer to the Current Medication list given to you today.  *If you need a refill on your cardiac medications before your next appointment, please call your pharmacy*  Lab Work: None ordered.  You may go to any [facility] Location for your lab work:  [facility] - [address] ([facility]) - [address] [phone] [address]  [place] - [address]   [place]  - [address]   [place] - [address] - [address] [employer]  - [address] ([facility]   If you have labs (blood work) drawn today and your tests are completely normal, you will receive your results only by: [employer] (if you have MyChart)  If you have any lab test that is abnormal or we need to change your treatment, we will call you or send a MyChart message to review the results.  Testing/Procedures: GEOFFRY HEWS- Long Term Monitor Instructions  Your physician has requested you wear a ZIO patch monitor for 3 days.   This is a single patch monitor. [employer] supplies one patch monitor per enrollment. Additional  stickers are not available. Please do not apply patch if you will be having a Nuclear Stress Test,  Echocardiogram, Cardiac CT, MRI, or Chest Xray during the period you would be wearing the  monitor. The patch cannot be worn during these tests. You cannot remove and re-apply the  ZIO XT patch monitor.   Your ZIO patch monitor will be mailed 3 day USPS to your address on file. It may take 3-5 days  to receive your monitor after you have been enrolled.   Once you have received your monitor, please review the enclosed instructions. Your monitor  has already been registered assigning a specific monitor serial # to you.     Billing and Patient Assistance  Program Information  We have supplied [employer] with any of your insurance information on file for billing purposes.  [employer] offers a sliding scale Patient Assistance Program for patients that do not have  insurance, or whose insurance does not completely cover the cost of the ZIO monitor.  You must apply for the Patient Assistance Program to qualify for this discounted rate.   To apply, please call [employer] at [phone], select option 4, select option 2, ask to apply for  Patient Assistance Program. Meredeth will ask your household income, and how many people  are in your household. They will quote your out-of-pocket cost based on that information.  [employer] will also be able to set up a 50-month, interest-free payment plan if needed.     Applying the monitor  Shave hair from upper left chest.  Hold abrader disc by orange tab. Rub abrader in 40 strokes over the upper left chest as  indicated in your monitor instructions.  Clean area with 4 enclosed alcohol  pads. Let dry.  Apply patch as indicated in monitor instructions. Patch will be placed under collarbone on left  side of chest with arrow pointing upward.  Rub patch adhesive wings for 2 minutes. Remove white label marked 1. Remove the white  label marked 2. Rub patch adhesive wings for 2 additional minutes.  While looking in a mirror, press and release button in center of patch. A small green light  will  flash 3-4 times. This will be your only indicator that the monitor has been turned on.  Do not shower for the first 24 hours. You may shower after the first 24 hours.  Press the button if you feel a symptom. You will hear a small click. Record Date, Time and  Symptom in the Patient Logbook.  When you are ready to remove the patch, follow instructions on the last 2 pages of Patient  Logbook. Stick patch monitor onto the last page of Patient Logbook.   Place Patient Logbook in the blue and white box. Use locking tab on box and tape  box closed  securely. The blue and white box has prepaid postage on it. Please place it in the mailbox as  soon as possible. Your physician should have your test results approximately 7 days after the  monitor has been mailed back to [facility].   Call [facility] Customer Care at [phone] if you have questions regarding  your ZIO XT patch monitor. Call them immediately if you see an orange light blinking on your  monitor.   If your monitor falls off in less than 4 days, contact our Monitor department at [phone].   If your monitor becomes loose or falls off after 4 days call [employer] at [phone] for  suggestions on securing your monitor.     Follow-Up: At [facility], you and your health needs are our priority.  As part of our continuing mission to provide you with exceptional heart care, we have created designated Provider Care Teams.  These Care Teams include your primary Cardiologist (physician) and Advanced Practice Providers (APPs -  Physician Assistants and Nurse Practitioners) who all work together to provide you with the care you need, when you need it.  We recommend signing up for the patient portal called MyChart.  Sign up information is provided on this After Visit Summary.  MyChart is used to connect with patients for Virtual Visits (Telemedicine).  Patients are able to view lab/test results, encounter notes, upcoming appointments, etc.  Non-urgent messages can be sent to your provider as well.   To learn more about what you can do with MyChart, go to [email or web address].    Your next appointment:   1 year(s)  The format for your next appointment:   In Person  Provider:   Donnice Primus, MD or one of the following Advanced Practice Providers on your designated Care Team:   Charlies Arthur, [place] Ozell Jodie Passey, [place] Leotis Barrack, NP  Note: Remote monitoring is used to monitor your Pacemaker/ ICD from home. This monitoring reduces the  number of office visits required to check your device to one time per year. It allows us  to keep an eye on the functioning of your device to ensure it is working properly.

## 2024-03-03 NOTE — Progress Notes (Signed)
 HPI [name] returns for ongoing evaluation of PVC's. 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 She wore a cardiac monitor which demonstrated 16% PVC's. She was started on amiodarone  by me.  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 She broke her femur in the interim but otherwise feels well. He palpitations are much improved on amio. Since stopping she notes some return of her symptoms but not as bad.  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 Swallow whole. 30 tablet 12   cetirizine  (ZYRTEC ) 10 MG tablet TAKE 1 TABLET (10 MG TOTAL) BY MOUTH DAILY. 90 tablet 1   colestipol  (COLESTID ) 1 g tablet Take 1-2 g by mouth daily as needed (ibs flare up).     cycloSPORINE  (RESTASIS ) 0.05 % ophthalmic emulsion Place 2 drops into both eyes 2 (two) times daily as needed (dry eyes).     fenofibrate  (TRICOR ) 48 MG tablet Take 1 tablet (48 mg total) by mouth daily. 90 tablet 0   ferrous sulfate  325 (65 FE) MG tablet Take 1 tablet (325 mg total) by mouth every other day. 14 tablet 0   folic acid  (FOLVITE ) 1 MG tablet Take 1 tablet (1 mg total) by mouth daily. 30 tablet 5   gabapentin  (NEURONTIN ) 300 MG capsule TAKE 2 CAPSULES BY MOUTH 2 TIMES DAILY. (Patient taking differently: Take 300-600 mg by mouth See admin instructions. Take 300 mg by mouth in the morning and 600 mg at night) 360 capsule 1   [Paused] hydroxychloroquine  (PLAQUENIL ) 200 MG tablet TAKE 1 TABLET (200 MG TOTAL) BY MOUTH DAILY. 90 tablet 0    hyoscyamine  (ANASPAZ ) 0.125 MG TBDP disintergrating tablet Place 0.125 mg under the tongue every 6 (six) hours as needed for cramping.     LORazepam  (ATIVAN ) 0.5 MG tablet Take 1 tablet (0.5 mg total) by mouth at bedtime as needed for sleep. At bedtime prn 3 tablet 0   methocarbamol  (ROBAXIN ) 500 MG tablet Take 1 tablet (500 mg total) by mouth every 6 (six) hours as needed for muscle spasms. 40 tablet 0   metoprolol  succinate (TOPROL -XL) 25 MG 24 hr tablet TAKE 1/2 TABLET BY MOUTH (12.5 MG TOTAL) DAILY (Patient taking differently: Take 12.5 mg by mouth daily.) 45 tablet 3   Multiple Vitamin (MULTIVITAMIN WITH MINERALS) TABS tablet Take 1 tablet by mouth daily.     NONFORMULARY OR COMPOUNDED ITEM Apply 1 application  topically daily as needed (irritation). Triamcinolone  0.1% & Silvadene cream 1:1- Apply as directed to affected areas as needed     nystatin  cream (MYCOSTATIN ) Apply 1 Application topically daily.     omeprazole (PRILOSEC) 40 MG capsule Take 40 mg by mouth daily as needed (acid reflux).     ondansetron  (ZOFRAN ) 4 MG tablet Take 1 tablet (4 mg total) by mouth every 6 (six) hours as needed for nausea. 20 tablet 0   polyethylene  glycol (MIRALAX ) 17 g packet Take 17 g by mouth daily as needed for mild constipation.     potassium chloride  (KLOR-CON ) 10 MEQ tablet Take 2 tablets (20 mEq total) by mouth daily. 180 tablet 3   rosuvastatin  (CRESTOR ) 10 MG tablet Take 1 tablet (10 mg total) by mouth daily. Please keep scheduled appointment for future refills. Thank you. 30 tablet 1   spironolactone  (ALDACTONE ) 25 MG tablet TAKE 1 TABLET (25 MG TOTAL) BY MOUTH DAILY. (Patient taking differently: Take 12.5 mg by mouth daily.) 90 tablet 0   torsemide  40 MG TABS Take 40 mg by mouth daily. 30 tablet 0   No current facility-administered medications for this visit.     Past Medical History:  Diagnosis Date   Agatston coronary artery calcium  score greater than 400 05/2022   coronary Ca score 856    Anemia    Back pain    Blood transfusion    1981   Cancer (HCC)    basal cell carcinoma, x 3   on leg and bottom of spine   Chronic diastolic CHF (congestive heart failure) (HCC)    diastolic    Chronic kidney disease    CKD3a   COVID    DDD (degenerative disc disease), cervical    DDD (degenerative disc disease), lumbar    Family history of coronary arteriosclerosis- strong fam h/o CAD and early CAD.  07/17/2017   Fibromyalgia    GERD (gastroesophageal reflux disease)    Heart murmur    as a child   Hiatal hernia    sjorgens syndrome   High blood pressure    High cholesterol    IBS (irritable bowel syndrome)    OSA (obstructive sleep apnea)    Osteoarthritis    Peripheral autonomic neuropathy of unknown cause    Pneumonia    Pre-diabetes    PVC (premature ventricular contraction)    Raynaud disease    Rheumatoid arthritis (HCC)    Sjogren's disease (HCC)    Urticaria    Vitamin D  deficiency     ROS:   All systems reviewed and negative except as noted in the HPI.   Past Surgical History:  Procedure Laterality Date   ABDOMINAL HYSTERECTOMY     BTL, BSO   ADENOIDECTOMY     BREAST EXCISIONAL BIOPSY Left    BREAST SURGERY     mass removal    CHOLECYSTECTOMY     dental implants     DIAGNOSTIC LAPAROSCOPY     x3   FEMUR FRACTURE SURGERY Right    I & D KNEE WITH POLY EXCHANGE Right 12/12/2023   Procedure: IRRIGATION AND DEBRIDEMENT KNEE WITH POLY EXCHANGE;  Surgeon: Melodi Lerner, MD;  Location: [facility];  Service: Orthopedics;  Laterality: Right;   KNEE ARTHROSCOPY     x2   MASS EXCISION Left 03/22/2017   Procedure: EXCISION OF LEFT BREAST MASS;  Surgeon: Belinda Cough, MD;  Location: [facility];  Service: General;  Laterality: Left;   MULTIPLE TOOTH EXTRACTIONS  06/2023   x5   ORIF ANKLE FRACTURE Right 12/29/2023   Procedure: OPEN REDUCTION INTERNAL FIXATION (ORIF) ANKLE FRACTURE;  Surgeon: Barton Drape, MD;  Location: [facility];  Service: Orthopedics;   Laterality: Right;   ORIF FEMUR FRACTURE Right 06/22/2022   Procedure: RIGHT OPEN REDUCTION INTERNAL FIXATION (ORIF) DISTAL FEMUR FRACTURE;  Surgeon: Celena Sharper, MD;  Location: [facility];  Service: Orthopedics;  Laterality: Right;   patotid cystectomy     RIGHT/LEFT HEART CATH  AND CORONARY ANGIOGRAPHY N/A 07/25/2019   Procedure: RIGHT/LEFT HEART CATH AND CORONARY ANGIOGRAPHY;  Surgeon: Cherrie Toribio SAUNDERS, MD;  Location: [facility];  Service: Cardiovascular;  Laterality: N/A;   TENDON REPAIR  1980   left ankle and tibia   TONSILLECTOMY     TOTAL KNEE ARTHROPLASTY  06/21/2011   Procedure: TOTAL KNEE ARTHROPLASTY;  Surgeon: Dempsey LULLA Moan;  Location: [facility];  Service: Orthopedics;  Laterality: Right;   TUBAL LIGATION       Family History  Problem Relation Age of Onset   Alzheimer's disease Mother    Heart attack Mother    Hypertension Mother    Glaucoma Mother    High Cholesterol Mother    Heart disease Mother    Depression Mother    Cancer Father    High Cholesterol Father    Heart disease Father    Sleep apnea Father    Breast cancer Maternal Aunt    Heart attack Brother    Heart disease Brother    Glaucoma Brother    Hyperlipidemia Brother    Glaucoma Brother    Asthma Son    Allergic rhinitis Neg Hx    Angioedema Neg Hx    Eczema Neg Hx    Urticaria Neg Hx    Neuropathy Neg Hx      Social History   Socioeconomic History   Marital status: Widowed    Spouse name: Not on file   Number of children: 2   Years of education: AS   Highest education level: Bachelor's degree (e.g., BA, AB, BS)  Occupational History   Occupation: retired Engineer, civil (consulting)   Occupation: RN  Tobacco Use   Smoking status: Never    Passive exposure: Past   Smokeless tobacco: Never  Vaping Use   Vaping status: Never Used  Substance and Sexual Activity   Alcohol  use: Yes    Comment: occ   Drug use: No   Sexual activity: Never  Other Topics Concern   Not on file  Social History  Narrative   Lives at home alone   Right-handed   Drinks 1 or less cups of coffee and 2 servings of either tea or soda per day   Social Drivers of Health   Financial Resource Strain: Low Risk  (07/29/2023)   Overall Financial Resource Strain (CARDIA)    Difficulty of Paying Living Expenses: Not hard at all  Food Insecurity: No Food Insecurity (12/26/2023)   Hunger Vital Sign    Worried About Running Out of Food in the Last Year: Never true    Ran Out of Food in the Last Year: Never true  Transportation Needs: No Transportation Needs (12/26/2023)   PRAPARE - Administrator, Civil Service (Medical): No    Lack of Transportation (Non-Medical): No  Physical Activity: Inactive (07/29/2023)   Exercise Vital Sign    Days of Exercise per Week: 0 days    Minutes of Exercise per Session: 30 min  Stress: No Stress Concern Present (07/29/2023)   [employer] of Occupational Health - Occupational Stress Questionnaire    Feeling of Stress : Only a little  Social Connections: Moderately Integrated (12/26/2023)   Social Connection and Isolation Panel    Frequency of Communication with Friends and Family: More than three times a week    Frequency of Social Gatherings with Friends and Family: Three times a week    Attends Religious Services: More than 4 times per year    Active  Member of Clubs or Organizations: Yes    Attends Engineer, structural: More than 4 times per year    Marital Status: Widowed  Intimate Partner Violence: Not At Risk (12/26/2023)   Humiliation, Afraid, Rape, and Kick questionnaire    Fear of Current or Ex-Partner: No    Emotionally Abused: No    Physically Abused: No    Sexually Abused: No     BP 98/62   Pulse 71   Ht 5' 2.5 (1.588 m)   Wt 212 lb (96.2 kg)   SpO2 98%   BMI 38.16 kg/m   Physical Exam:  Well appearing NAD HEENT: Unremarkable Neck:  No JVD, no thyromegally Lymphatics:  No adenopathy Back:  No CVA tenderness Lungs:  Clear  with no wheezes HEART:  Regular rate rhythm, no murmurs, no rubs, no clicks Abd:  soft, positive bowel sounds, no organomegally, no rebound, no guarding Ext:  2 plus pulses, no edema, no cyanosis, no clubbing Skin:  No rashes no nodules Neuro:  CN II through XII intact, motor grossly intact  Assess/Plan:  PVC's -She will repeat her Zio monitor in about 3 months. We will reconsider her treatment if her PVC burden has increased. CAD - her coronary CT scan is elevated. I'l;l defer management to Dr. Shlomo.   Marisa Birmingham, MD

## 2024-03-04 ENCOUNTER — Ambulatory Visit (INDEPENDENT_AMBULATORY_CARE_PROVIDER_SITE_OTHER): Admitting: Pulmonary Disease

## 2024-03-04 DIAGNOSIS — R942 Abnormal results of pulmonary function studies: Secondary | ICD-10-CM

## 2024-03-04 DIAGNOSIS — R0602 Shortness of breath: Secondary | ICD-10-CM

## 2024-03-04 LAB — PULMONARY FUNCTION TEST
DL/VA % pred: 104 %
DL/VA: 4.4 ml/min/mmHg/L
DLCO cor % pred: 79 %
DLCO cor: 14.15 ml/min/mmHg
DLCO unc % pred: 77 %
DLCO unc: 13.7 ml/min/mmHg
FEF 25-75 Post: 1.91 L/s
FEF 25-75 Pre: 1.03 L/s
FEF2575-%Change-Post: 86 %
FEF2575-%Pred-Post: 111 %
FEF2575-%Pred-Pre: 60 %
FEV1-%Change-Post: 14 %
FEV1-%Pred-Post: 79 %
FEV1-%Pred-Pre: 69 %
FEV1-Post: 1.6 L
FEV1-Pre: 1.39 L
FEV1FVC-%Change-Post: 11 %
FEV1FVC-%Pred-Pre: 98 %
FEV6-%Change-Post: 3 %
FEV6-%Pred-Post: 75 %
FEV6-%Pred-Pre: 73 %
FEV6-Post: 1.93 L
FEV6-Pre: 1.87 L
FEV6FVC-%Change-Post: 0 %
FEV6FVC-%Pred-Post: 104 %
FEV6FVC-%Pred-Pre: 104 %
FVC-%Change-Post: 3 %
FVC-%Pred-Post: 72 %
FVC-%Pred-Pre: 70 %
FVC-Post: 1.93 L
FVC-Pre: 1.87 L
Post FEV1/FVC ratio: 83 %
Post FEV6/FVC ratio: 100 %
Pre FEV1/FVC ratio: 74 %
Pre FEV6/FVC Ratio: 100 %

## 2024-03-04 NOTE — Progress Notes (Signed)
 Patient non weight bearing and not able to get out of wheelchair. Pre/post spirometry and dlco performed today.Patient will call back to schedule lung volumes once cleared by physician.

## 2024-03-04 NOTE — Patient Instructions (Signed)
 Pre/post spirometry and dlco performed today.

## 2024-03-05 ENCOUNTER — Ambulatory Visit: Payer: Self-pay | Admitting: Pulmonary Disease

## 2024-03-06 ENCOUNTER — Ambulatory Visit: Admitting: Infectious Diseases

## 2024-03-06 ENCOUNTER — Other Ambulatory Visit: Payer: Self-pay

## 2024-03-06 VITALS — BP 109/68 | HR 75 | Temp 97.7°F

## 2024-03-06 DIAGNOSIS — Z79899 Other long term (current) drug therapy: Secondary | ICD-10-CM | POA: Diagnosis not present

## 2024-03-06 DIAGNOSIS — M0579 Rheumatoid arthritis with rheumatoid factor of multiple sites without organ or systems involvement: Secondary | ICD-10-CM | POA: Diagnosis not present

## 2024-03-06 DIAGNOSIS — T8453XD Infection and inflammatory reaction due to internal right knee prosthesis, subsequent encounter: Secondary | ICD-10-CM

## 2024-03-06 NOTE — Progress Notes (Addendum)
 Patient Active Problem List   Diagnosis Date Noted   Medication management 01/08/2024   PICC (peripherally inserted central catheter) in place 01/08/2024   Obesity, Class III, BMI 40-49.9 (morbid obesity) 01/01/2024   Right displaced trimalleolar ankle fracture with syndesmosis disruption 12/26/2023   Fall at home, initial encounter 12/26/2023   History of rheumatoid arthritis 12/26/2023   Hypokalemia 12/26/2023   Itching 12/14/2023   Infection of prosthetic right knee joint (HCC) 12/12/2023   IBS (irritable bowel syndrome) 11/02/2023   IDA (iron  deficiency anemia) 06/05/2023   Normocytic normochromic anemia 06/05/2023   Normocytic anemia 06/01/2023   Situational anxiety 10/15/2022   Hx of Pressure sore of left ischial area, stage IV- healed as of February 2025 09/10/2022   DNR (do not resuscitate) 06/21/2022   Agatston coronary artery calcium  score greater than 400 05/25/2022   Laryngopharyngeal reflux (LPR) 10/10/2021   Chronic maxillary sinusitis 10/10/2021   Prediabetes 02/14/2021   Chronic kidney disease, stage 3b (HCC) 08/01/2020   Post-menopausal 09/02/2019   Essential hypertension 08/20/2018   Recurrent urticaria 05/20/2018   Vitamin D  deficiency 11/20/2017   Hyperlipidemia 11/20/2017   Raynaud's disease without gangrene 03/12/2017   Eosinophilic esophagitis 10/12/2016   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   History of IBS 09/03/2016   Autoimmune disease (HCC) 09/02/2016   Abnormality of gait 05/09/2016   Sjogren syndrome (HCC) 02/29/2016   GERD (gastroesophageal reflux disease) 01/19/2016   Glucose intolerance (impaired glucose tolerance) 01/19/2016   Chronic diastolic CHF (congestive heart failure) (HCC) 04/20/2015   Peripheral neuropathy 02/02/2014   Class 2 obesity due to excess calories with body mass index (BMI) of 36.0 to 36.9 in adult 11/17/2013   Heart murmur    OSA (obstructive sleep apnea)    Rheumatoid arthritis (HCC)    Hiatal hernia     Fibromyalgia    History of PVC (premature ventricular contraction)    Unilateral primary osteoarthritis, left knee 06/22/2011    Patient's Medications  New Prescriptions   No medications on file  Previous Medications   ASPIRIN  EC 81 MG TABLET    Take 1 tablet (81 mg total) by mouth daily. Swallow whole.   CETIRIZINE  (ZYRTEC ) 10 MG TABLET    TAKE 1 TABLET (10 MG TOTAL) BY MOUTH DAILY.   COLESTIPOL  (COLESTID ) 1 G TABLET    Take 1-2 g by mouth daily as needed (ibs flare up).   CYCLOSPORINE  (RESTASIS ) 0.05 % OPHTHALMIC EMULSION    Place 2 drops into both eyes 2 (two) times daily as needed (dry eyes).   FENOFIBRATE  (TRICOR ) 48 MG TABLET    Take 1 tablet (48 mg total) by mouth daily.   FERROUS SULFATE  325 (65 FE) MG TABLET    Take 1 tablet (325 mg total) by mouth every other day.   FOLIC ACID  (FOLVITE ) 1 MG TABLET    Take 1 tablet (1 mg total) by mouth daily.   GABAPENTIN  (NEURONTIN ) 300 MG CAPSULE    TAKE 2 CAPSULES BY MOUTH 2 TIMES DAILY.   HYDROXYCHLOROQUINE  (PLAQUENIL ) 200 MG TABLET    TAKE 1 TABLET (200 MG TOTAL) BY MOUTH DAILY.   HYOSCYAMINE  (ANASPAZ ) 0.125 MG TBDP DISINTERGRATING TABLET    Place 0.125 mg under the tongue every 6 (six) hours as needed for cramping.   LORAZEPAM  (ATIVAN ) 0.5 MG TABLET    Take 1 tablet (0.5 mg total) by mouth at bedtime as needed for sleep. At bedtime prn   METHOCARBAMOL  (ROBAXIN ) 500 MG TABLET  Take 1 tablet (500 mg total) by mouth every 6 (six) hours as needed for muscle spasms.   METOPROLOL  SUCCINATE (TOPROL -XL) 25 MG 24 HR TABLET    TAKE 1/2 TABLET BY MOUTH (12.5 MG TOTAL) DAILY   MULTIPLE VITAMIN (MULTIVITAMIN WITH MINERALS) TABS TABLET    Take 1 tablet by mouth daily.   NONFORMULARY OR COMPOUNDED ITEM    Apply 1 application  topically daily as needed (irritation). Triamcinolone  0.1% & Silvadene cream 1:1- Apply as directed to affected areas as needed   NYSTATIN  CREAM (MYCOSTATIN )    Apply 1 Application topically daily.   OMEPRAZOLE (PRILOSEC) 40 MG  CAPSULE    Take 40 mg by mouth daily as needed (acid reflux).   ONDANSETRON  (ZOFRAN ) 4 MG TABLET    Take 1 tablet (4 mg total) by mouth every 6 (six) hours as needed for nausea.   POLYETHYLENE GLYCOL (MIRALAX ) 17 G PACKET    Take 17 g by mouth daily as needed for mild constipation.   POTASSIUM CHLORIDE  (KLOR-CON ) 10 MEQ TABLET    Take 2 tablets (20 mEq total) by mouth daily.   ROSUVASTATIN  (CRESTOR ) 10 MG TABLET    Take 1 tablet (10 mg total) by mouth daily. Please keep scheduled appointment for future refills. Thank you.   SPIRONOLACTONE  (ALDACTONE ) 25 MG TABLET    TAKE 1 TABLET (25 MG TOTAL) BY MOUTH DAILY.   TORSEMIDE  40 MG TABS    Take 40 mg by mouth daily.  Modified Medications   No medications on file  Discontinued Medications   No medications on file    Subjective: 73 year old female with prior history of basal cell carcinoma, CHF, CKD, DDD, GERD, Sjogren's syndrome, RA not on tx, Raynaud's phenomenon, peripheral neuropathy, fibromyalgia, HTN, HLD, obesity/OSA, OA, prediabetes, right knee arthroplasty, prolonged healing of sacral decub infection (managed with initial antibiotics and wound care) admitted who is here for HFU ( 4/30-5/3) for rt knee PJI.   She got re-admitted 5/14-5/21 after a fall leading to rt ankle #. She underwent ORIF on 5/17. She was seen by Ortho for her rt knee while admitted and was told her wound healing as expected.   5/27 Accompanied by her daughter. She comes from SNF. Receiving IV ampicillin  through PICC. MAR reviewed, not on plaquenil  or any treatment for RA. She reports some hot flushes and need to pee a lot while on antibiotics. Denies urgency or burning of urine. Denies fevers and chills. Denies nausea, vomiting and diarrhea. Rt knee wound has healed. Has a fu with Ortho for rt ankle Thursday and for Rt knee with Dr Hiram in June. No other concerns.   6/9 Accompanied by daughter. Getting IV ampicillin  through PICC without any concerns like nausea,  vomiting, diarrhea. Reports mild redness at PICC insertion site but no other symptoms concerning of infection like warmth, redness, swelling and tenderness. Rt knee wound has healed. She has not seen Dr Hiram after last visit. She has seen Ortho for rft ankle. Labs drawn from 6/9 and last Monday which has been requested from SNF.   7/24 Completed IV ampicillin  on 6/12 after which PICC removed and started taking Amoxicillin  1g po tid without missing doses or any concerns. Last seen by JONETTA Mountain on 7/3 and next appt is tomorrow and will decide if she can be WB. Seen by Ortho for rt knee on 7/8, Dr Hiram on 6/17.  Seen by Hematology on 6/20 for anemia. Next fu with Dr Hiram is in Aug. No concerns for  infection in rt hip and ankle.  She has a fu with Rheum up coming. No complaints.    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  07/17/2017   Fibromyalgia    GERD (gastroesophageal reflux disease)    Heart murmur    as a child   Hiatal hernia    sjorgens syndrome   High blood pressure    High cholesterol    IBS (irritable bowel syndrome)    OSA (obstructive sleep apnea)    Osteoarthritis    Peripheral autonomic neuropathy of unknown cause    Pneumonia    Pre-diabetes    PVC (premature ventricular contraction)    Raynaud disease    Rheumatoid arthritis (HCC)    Sjogren's disease (HCC)    Urticaria     Vitamin D  deficiency    Past Surgical History:  Procedure Laterality Date   ABDOMINAL HYSTERECTOMY     BTL, BSO   ADENOIDECTOMY     BREAST EXCISIONAL BIOPSY Left    BREAST SURGERY     mass removal    CHOLECYSTECTOMY     dental implants     DIAGNOSTIC LAPAROSCOPY     x3   FEMUR FRACTURE SURGERY Right    I & D KNEE WITH POLY EXCHANGE Right 12/12/2023   Procedure: IRRIGATION AND DEBRIDEMENT KNEE WITH POLY EXCHANGE;  Surgeon: Melodi Lerner, MD;  Location: [facility];  Service: Orthopedics;  Laterality: Right;   KNEE ARTHROSCOPY     x2   MASS EXCISION Left 03/22/2017   Procedure: EXCISION OF LEFT BREAST MASS;  Surgeon: Belinda Cough, MD;  Location: [facility];  Service: General;  Laterality: Left;   MULTIPLE TOOTH EXTRACTIONS  06/2023   x5   ORIF ANKLE FRACTURE Right 12/29/2023   Procedure: OPEN REDUCTION INTERNAL FIXATION (ORIF) ANKLE FRACTURE;  Surgeon: Barton Drape, MD;  Location: [facility];  Service: Orthopedics;  Laterality: Right;   ORIF FEMUR FRACTURE Right 06/22/2022   Procedure: RIGHT OPEN REDUCTION INTERNAL FIXATION (ORIF) DISTAL FEMUR FRACTURE;  Surgeon: Celena Sharper, MD;  Location: [facility];  Service: Orthopedics;  Laterality: Right;   patotid cystectomy     RIGHT/LEFT HEART CATH AND CORONARY ANGIOGRAPHY N/A 07/25/2019   Procedure: RIGHT/LEFT HEART CATH AND CORONARY ANGIOGRAPHY;  Surgeon: Cherrie Toribio SAUNDERS, MD;  Location: [facility];  Service: Cardiovascular;  Laterality: N/A;   TENDON REPAIR  1980   left ankle and tibia   TONSILLECTOMY     TOTAL KNEE ARTHROPLASTY  06/21/2011   Procedure: TOTAL KNEE ARTHROPLASTY;  Surgeon: Lerner LULLA Melodi;  Location: [facility];  Service: Orthopedics;  Laterality: Right;   TUBAL LIGATION      Social History   Tobacco Use   Smoking status: Never    Passive exposure: Past  Smokeless tobacco: Never  Vaping Use   Vaping status: Never Used  Substance Use Topics   Alcohol  use: Yes    Comment: occ   Drug use: No    Family  History  Problem Relation Age of Onset   Alzheimer's disease Mother    Heart attack Mother    Hypertension Mother    Glaucoma Mother    High Cholesterol Mother    Heart disease Mother    Depression Mother    Cancer Father    High Cholesterol Father    Heart disease Father    Sleep apnea Father    Breast cancer Maternal Aunt    Heart attack Brother    Heart disease Brother    Glaucoma Brother    Hyperlipidemia Brother    Glaucoma Brother    Asthma Son    Allergic rhinitis Neg Hx    Angioedema Neg Hx    Eczema Neg Hx    Urticaria Neg Hx    Neuropathy Neg Hx     Allergies  Allergen Reactions   Sulfa Antibiotics Hives and Itching    Flushing   Cymbalta [Duloxetine Hcl] Other (See Comments)    Manic reaction   Demerol Hives and Other (See Comments)    Fever    Ivp Dye [Iodinated Contrast Media] Hives and Other (See Comments)    Fever   Nsaids Other (See Comments)    CKD stage 3b - GFR 41   Adhesive [Tape] Rash    Health Maintenance  Topic Date Due   Colonoscopy  11/21/2022   Medicare Annual Wellness (AWV)  12/28/2023   COVID-19 Vaccine (8 - Pfizer risk 2024-25 season) 01/03/2024   INFLUENZA VACCINE  03/14/2024   MAMMOGRAM  11/05/2025   DTaP/Tdap/Td (3 - Td or Tdap) 03/21/2028   Pneumococcal Vaccine: 50+ Years  Completed   DEXA SCAN  Completed   Hepatitis C Screening  Completed   Zoster Vaccines- Shingrix   Completed   Hepatitis B Vaccines  Aged Out   HPV VACCINES  Aged Out   Meningococcal B Vaccine  Aged Out    Objective: BP 109/68   Pulse 75   Temp 97.7 F (36.5 C) (Temporal)   SpO2 95% '  Physical Exam Constitutional:      Appearance: Normal appearance.  HENT:     Head: Normocephalic and atraumatic.      Mouth: Mucous membranes are moist.  Eyes:    Conjunctiva/sclera: Conjunctivae normal.     Pupils: Pupils are equal, round, and b/l symmetrical    Cardiovascular:     Rate and Rhythm: Normal rate and regular rhythm.     Heart sounds:    Pulmonary:     Effort: Pulmonary effort is normal.     Breath sounds:   Abdominal:     General: Non distended     Palpations:   Musculoskeletal:        General: sitting in the wheelchair, Rt foot and ankle wrapped in a  bandage. Rt knee surgical site has healed with no signs of infection. Rt arm PICC removed   Skin:    General: Skin is warm and dry.     Comments:  Neurological:     General: grossly non focal     Mental Status: awake, alert and oriented to person, place, and time.   Psychiatric:        Mood and Affect: Mood normal.   Lab Results Lab Results  Component Value Date   WBC 7.3  02/01/2024   HGB 12.4 02/01/2024   HCT 38.2 02/01/2024   MCV 86.6 02/01/2024   PLT 240 02/01/2024    Lab Results  Component Value Date   CREATININE 1.36 (H) 02/01/2024   BUN 43 (H) 02/01/2024   NA 138 02/01/2024   K 4.4 02/01/2024   CL 106 02/01/2024   CO2 20 (L) 02/01/2024    Lab Results  Component Value Date   ALT 8 02/01/2024   AST 14 (L) 02/01/2024   ALKPHOS 99 02/01/2024   BILITOT 0.3 02/01/2024    Lab Results  Component Value Date   CHOL 120 10/23/2023   HDL 60 10/23/2023   LDLCALC 38 10/23/2023   TRIG 125 10/23/2023   CHOLHDL 2.0 10/23/2023   No results found for: LABRPR, RPRTITER No results found for: HIV1RNAQUANT, HIV1RNAVL, CD4TABS   Assessment/Plan # Right knee PJI - 3/28 aspiration in Ortho clinic with E faecalis - 4/30 I&D with liner exchange.  Or culture with E faecalis - No antibiotics prior to I&D - s/ 6 weeks of IV ampicillin  through 6/12,then switch PO amoxicillin  1g po tid starting 6/13 for 6 months total   Plan  - fu in a month - continue amoxicillin  1g po tid for at least 6 months, consider up to 12 months  - fu in Orthopedics Dr Hiram as instructed  # Rt ankle #  - s/p ORIF on 5/17  - fu with Dr Barton  # Medication management  - labs today, Patient requested ESR after collection of labs. Will order per her request.  Unclear significance in the setting of RA.   # RA  - not on any treatment per patient report and MAR  - Ok to resume Hydroxychloroquine  from ID standpoint  - Fu with Rheumatology   I spent 27 minutes involved in face-to-face and non-face-to-face activities for this patient on the day of the visit. Professional time spent includes the following activities: Preparing to see the patient (review of tests), Reviewing prior notes from Dr Barton, Dr Hiram, Ortho PA and Hematology,  Performing a medically appropriate examination and evaluation, Ordering labs, writing orders for SNF in separate note, Documenting clinical information in the EMR, Independently interpreting results (not separately reported), Communicating results to the patient/daughter, Counseling and educating the patient.   Of note, portions of this note may have been created with voice recognition software. While this note has been edited for accuracy, occasional wrong-word or 'sound-a-like' substitutions may have occurred due to the inherent limitations of voice recognition software.   Annalee Joseph, MD [facility] 03/06/2024, 1:52 PM

## 2024-03-07 DIAGNOSIS — S82851A Displaced trimalleolar fracture of right lower leg, initial encounter for closed fracture: Secondary | ICD-10-CM | POA: Diagnosis not present

## 2024-03-07 LAB — C-REACTIVE PROTEIN: CRP: 13.7 mg/L — ABNORMAL HIGH (ref <8.0)

## 2024-03-07 LAB — COMPREHENSIVE METABOLIC PANEL WITH GFR
AG Ratio: 1.5 (calc) (ref 1.0–2.5)
ALT: 11 U/L (ref 6–29)
AST: 16 U/L (ref 10–35)
Albumin: 4.2 g/dL (ref 3.6–5.1)
Alkaline phosphatase (APISO): 80 U/L (ref 37–153)
BUN/Creatinine Ratio: 35 (calc) — ABNORMAL HIGH (ref 6–22)
BUN: 46 mg/dL — ABNORMAL HIGH (ref 7–25)
CO2: 24 mmol/L (ref 20–32)
Calcium: 9.6 mg/dL (ref 8.6–10.4)
Chloride: 103 mmol/L (ref 98–110)
Creat: 1.3 mg/dL — ABNORMAL HIGH (ref 0.60–1.00)
Globulin: 2.8 g/dL (ref 1.9–3.7)
Glucose, Bld: 178 mg/dL — ABNORMAL HIGH (ref 65–99)
Potassium: 4.5 mmol/L (ref 3.5–5.3)
Sodium: 141 mmol/L (ref 135–146)
Total Bilirubin: 0.2 mg/dL (ref 0.2–1.2)
Total Protein: 7 g/dL (ref 6.1–8.1)
eGFR: 44 mL/min/1.73m2 — ABNORMAL LOW (ref >=60)

## 2024-03-07 LAB — CBC
HCT: 39 % (ref 35.0–45.0)
Hemoglobin: 12.6 g/dL (ref 11.7–15.5)
MCH: 28.8 pg (ref 27.0–33.0)
MCHC: 32.3 g/dL (ref 32.0–36.0)
MCV: 89 fL (ref 80.0–100.0)
MPV: 11 fL (ref 7.5–12.5)
Platelets: 237 Thousand/uL (ref 140–400)
RBC: 4.38 Million/uL (ref 3.80–5.10)
RDW: 15.8 % — ABNORMAL HIGH (ref 11.0–15.0)
WBC: 6.3 Thousand/uL (ref 3.8–10.8)

## 2024-03-17 DIAGNOSIS — H524 Presbyopia: Secondary | ICD-10-CM | POA: Diagnosis not present

## 2024-03-17 DIAGNOSIS — H5213 Myopia, bilateral: Secondary | ICD-10-CM | POA: Diagnosis not present

## 2024-03-17 DIAGNOSIS — Z79899 Other long term (current) drug therapy: Secondary | ICD-10-CM | POA: Diagnosis not present

## 2024-03-17 DIAGNOSIS — H2513 Age-related nuclear cataract, bilateral: Secondary | ICD-10-CM | POA: Diagnosis not present

## 2024-03-21 DIAGNOSIS — I130 Hypertensive heart and chronic kidney disease with heart failure and stage 1 through stage 4 chronic kidney disease, or unspecified chronic kidney disease: Secondary | ICD-10-CM | POA: Diagnosis not present

## 2024-03-21 DIAGNOSIS — D631 Anemia in chronic kidney disease: Secondary | ICD-10-CM | POA: Diagnosis not present

## 2024-03-21 DIAGNOSIS — E1122 Type 2 diabetes mellitus with diabetic chronic kidney disease: Secondary | ICD-10-CM | POA: Diagnosis not present

## 2024-03-21 DIAGNOSIS — K5900 Constipation, unspecified: Secondary | ICD-10-CM | POA: Diagnosis not present

## 2024-03-21 DIAGNOSIS — K5909 Other constipation: Secondary | ICD-10-CM | POA: Diagnosis not present

## 2024-03-21 DIAGNOSIS — K219 Gastro-esophageal reflux disease without esophagitis: Secondary | ICD-10-CM | POA: Diagnosis not present

## 2024-03-21 DIAGNOSIS — G4733 Obstructive sleep apnea (adult) (pediatric): Secondary | ICD-10-CM | POA: Diagnosis not present

## 2024-03-21 DIAGNOSIS — M800AXD Age-related osteoporosis with current pathological fracture, other site, subsequent encounter for fracture with routine healing: Secondary | ICD-10-CM | POA: Diagnosis not present

## 2024-03-21 DIAGNOSIS — E785 Hyperlipidemia, unspecified: Secondary | ICD-10-CM | POA: Diagnosis not present

## 2024-03-21 DIAGNOSIS — M3500 Sicca syndrome, unspecified: Secondary | ICD-10-CM | POA: Diagnosis not present

## 2024-03-21 DIAGNOSIS — Z96653 Presence of artificial knee joint, bilateral: Secondary | ICD-10-CM | POA: Diagnosis not present

## 2024-03-21 DIAGNOSIS — I5032 Chronic diastolic (congestive) heart failure: Secondary | ICD-10-CM | POA: Diagnosis not present

## 2024-03-21 DIAGNOSIS — M069 Rheumatoid arthritis, unspecified: Secondary | ICD-10-CM | POA: Diagnosis not present

## 2024-03-21 DIAGNOSIS — N1832 Chronic kidney disease, stage 3b: Secondary | ICD-10-CM | POA: Diagnosis not present

## 2024-03-21 DIAGNOSIS — Z9181 History of falling: Secondary | ICD-10-CM | POA: Diagnosis not present

## 2024-03-21 DIAGNOSIS — R32 Unspecified urinary incontinence: Secondary | ICD-10-CM | POA: Diagnosis not present

## 2024-03-21 DIAGNOSIS — S82851D Displaced trimalleolar fracture of right lower leg, subsequent encounter for closed fracture with routine healing: Secondary | ICD-10-CM | POA: Diagnosis not present

## 2024-03-21 DIAGNOSIS — M80051D Age-related osteoporosis with current pathological fracture, right femur, subsequent encounter for fracture with routine healing: Secondary | ICD-10-CM | POA: Diagnosis not present

## 2024-03-21 DIAGNOSIS — M797 Fibromyalgia: Secondary | ICD-10-CM | POA: Diagnosis not present

## 2024-03-21 DIAGNOSIS — Z90710 Acquired absence of both cervix and uterus: Secondary | ICD-10-CM | POA: Diagnosis not present

## 2024-03-21 DIAGNOSIS — Z9049 Acquired absence of other specified parts of digestive tract: Secondary | ICD-10-CM | POA: Diagnosis not present

## 2024-03-21 DIAGNOSIS — Z7982 Long term (current) use of aspirin: Secondary | ICD-10-CM | POA: Diagnosis not present

## 2024-03-24 ENCOUNTER — Ambulatory Visit: Admitting: Neurology

## 2024-03-24 ENCOUNTER — Telehealth: Payer: Self-pay | Admitting: Adult Health

## 2024-03-24 NOTE — Telephone Encounter (Signed)
 Request to reschedule appointment Pt sick

## 2024-03-26 ENCOUNTER — Telehealth: Payer: Self-pay

## 2024-03-26 NOTE — Telephone Encounter (Signed)
Verbal approved

## 2024-03-26 NOTE — Telephone Encounter (Signed)
 Copied from CRM [id number]. Topic: Clinical - Home Health Verbal Orders >> Mar 26, 2024  9:09 AM Precious C wrote: Caller/Agency: Jessica/[facility] Callback Number: [phone] Service Requested: Skilled Nursing Frequency: 1x for 3 weeks and 1x every other week for 4 weeks. Any new concerns about the patient? No

## 2024-03-27 NOTE — Progress Notes (Deleted)
 Office Visit Note  Patient: [name]             Date of Birth: 1951-07-07           MRN: [id number]             PCP: Chandra Toribio POUR, MD Referring: Chandra Toribio POUR, MD Visit Date: 04/10/2024 Occupation: @GUAROCC @  Subjective:  No chief complaint on file.   History of Present Illness: [name] is a 73 y.o. female ***     Activities of Daily Living:  Patient reports morning stiffness for *** {minute/hour:19697}.   Patient {ACTIONS;DENIES/REPORTS:21021675::Denies} nocturnal pain.  Difficulty dressing/grooming: {ACTIONS;DENIES/REPORTS:21021675::Denies} Difficulty climbing stairs: {ACTIONS;DENIES/REPORTS:21021675::Denies} Difficulty getting out of chair: {ACTIONS;DENIES/REPORTS:21021675::Denies} Difficulty using hands for taps, buttons, cutlery, and/or writing: {ACTIONS;DENIES/REPORTS:21021675::Denies}  No Rheumatology ROS completed.   PMFS History:  Patient Active Problem List   Diagnosis Date Noted   Medication management 01/08/2024   PICC (peripherally inserted central catheter) in place 01/08/2024   Obesity, Class III, BMI 40-49.9 (morbid obesity) 01/01/2024   Right displaced trimalleolar ankle fracture with syndesmosis disruption 12/26/2023   Fall at home, initial encounter 12/26/2023   History of rheumatoid arthritis 12/26/2023   Hypokalemia 12/26/2023   Itching 12/14/2023   Infection of prosthetic right knee joint (HCC) 12/12/2023   IBS (irritable bowel syndrome) 11/02/2023   IDA (iron  deficiency anemia) 06/05/2023   Normocytic normochromic anemia 06/05/2023   Normocytic anemia 06/01/2023   Situational anxiety 10/15/2022   Hx of Pressure sore of left ischial area, stage IV- healed as of February 2025 09/10/2022   DNR (do not resuscitate) 06/21/2022   Agatston coronary artery calcium  score greater than 400 05/25/2022   Laryngopharyngeal reflux (LPR) 10/10/2021   Chronic maxillary sinusitis 10/10/2021   Prediabetes 02/14/2021   Chronic kidney  disease, stage 3b (HCC) 08/01/2020   Post-menopausal 09/02/2019   Essential hypertension 08/20/2018   Recurrent urticaria 05/20/2018   Vitamin D  deficiency 11/20/2017   Hyperlipidemia 11/20/2017   Raynaud's disease without gangrene 03/12/2017   Eosinophilic esophagitis 10/12/2016   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   History of IBS 09/03/2016   Autoimmune disease (HCC) 09/02/2016   Abnormality of gait 05/09/2016   Sjogren syndrome (HCC) 02/29/2016   GERD (gastroesophageal reflux disease) 01/19/2016   Glucose intolerance (impaired glucose tolerance) 01/19/2016   Chronic diastolic CHF (congestive heart failure) (HCC) 04/20/2015   Peripheral neuropathy 02/02/2014   Class 2 obesity due to excess calories with body mass index (BMI) of 36.0 to 36.9 in adult 11/17/2013   Heart murmur    OSA (obstructive sleep apnea)    Rheumatoid arthritis (HCC)    Hiatal hernia    Fibromyalgia    History of PVC (premature ventricular contraction)    Unilateral primary osteoarthritis, left knee 06/22/2011    Past Medical History:  Diagnosis Date   Agatston coronary artery calcium  score greater than 400 05/2022   coronary Ca score 856   Anemia    Back pain    Blood transfusion    1981   Cancer (HCC)    basal cell carcinoma, x 3   on leg and bottom of spine   Chronic diastolic CHF (congestive heart failure) (HCC)    diastolic    Chronic kidney disease    CKD3a   COVID    DDD (degenerative disc disease), cervical    DDD (degenerative disc disease), lumbar    Family history of coronary arteriosclerosis- strong fam h/o CAD and early CAD.  07/17/2017   Fibromyalgia  GERD (gastroesophageal reflux disease)    Heart murmur    as a child   Hiatal hernia    sjorgens syndrome   High blood pressure    High cholesterol    IBS (irritable bowel syndrome)    OSA (obstructive sleep apnea)    Osteoarthritis    Peripheral autonomic neuropathy of unknown cause    Pneumonia    Pre-diabetes     PVC (premature ventricular contraction)    Raynaud disease    Rheumatoid arthritis (HCC)    Sjogren's disease (HCC)    Urticaria    Vitamin D  deficiency     Family History  Problem Relation Age of Onset   Alzheimer's disease Mother    Heart attack Mother    Hypertension Mother    Glaucoma Mother    High Cholesterol Mother    Heart disease Mother    Depression Mother    Cancer Father    High Cholesterol Father    Heart disease Father    Sleep apnea Father    Breast cancer Maternal Aunt    Heart attack Brother    Heart disease Brother    Glaucoma Brother    Hyperlipidemia Brother    Glaucoma Brother    Asthma Son    Allergic rhinitis Neg Hx    Angioedema Neg Hx    Eczema Neg Hx    Urticaria Neg Hx    Neuropathy Neg Hx    Past Surgical History:  Procedure Laterality Date   ABDOMINAL HYSTERECTOMY     BTL, BSO   ADENOIDECTOMY     BREAST EXCISIONAL BIOPSY Left    BREAST SURGERY     mass removal    CHOLECYSTECTOMY     dental implants     DIAGNOSTIC LAPAROSCOPY     x3   FEMUR FRACTURE SURGERY Right    I & D KNEE WITH POLY EXCHANGE Right 12/12/2023   Procedure: IRRIGATION AND DEBRIDEMENT KNEE WITH POLY EXCHANGE;  Surgeon: Melodi Lerner, MD;  Location: [facility];  Service: Orthopedics;  Laterality: Right;   KNEE ARTHROSCOPY     x2   MASS EXCISION Left 03/22/2017   Procedure: EXCISION OF LEFT BREAST MASS;  Surgeon: Belinda Cough, MD;  Location: [facility];  Service: General;  Laterality: Left;   MULTIPLE TOOTH EXTRACTIONS  06/2023   x5   ORIF ANKLE FRACTURE Right 12/29/2023   Procedure: OPEN REDUCTION INTERNAL FIXATION (ORIF) ANKLE FRACTURE;  Surgeon: Barton Drape, MD;  Location: [facility];  Service: Orthopedics;  Laterality: Right;   ORIF FEMUR FRACTURE Right 06/22/2022   Procedure: RIGHT OPEN REDUCTION INTERNAL FIXATION (ORIF) DISTAL FEMUR FRACTURE;  Surgeon: Celena Sharper, MD;  Location: [facility];  Service: Orthopedics;  Laterality: Right;   patotid cystectomy      RIGHT/LEFT HEART CATH AND CORONARY ANGIOGRAPHY N/A 07/25/2019   Procedure: RIGHT/LEFT HEART CATH AND CORONARY ANGIOGRAPHY;  Surgeon: Cherrie Toribio SAUNDERS, MD;  Location: [facility];  Service: Cardiovascular;  Laterality: N/A;   TENDON REPAIR  1980   left ankle and tibia   TONSILLECTOMY     TOTAL KNEE ARTHROPLASTY  06/21/2011   Procedure: TOTAL KNEE ARTHROPLASTY;  Surgeon: Lerner LULLA Melodi;  Location: [facility];  Service: Orthopedics;  Laterality: Right;   TUBAL LIGATION     Social History   Social History Narrative   Lives at home alone   Right-handed   Drinks 1 or less cups of coffee and 2 servings of either tea or soda per day  Immunization History  Administered Date(s) Administered   Fluad Quad(high Dose 65+) 05/05/2021   Influenza, High Dose Seasonal PF 06/12/2018, 05/17/2019   Influenza,inj,Quad PF,6+ Mos 05/23/2016, 06/01/2017   Influenza-Unspecified 06/05/2018, 05/17/2019, 06/07/2020, 05/18/2022, 07/06/2023   PFIZER(Purple Top)SARS-COV-2 Vaccination 09/03/2019, 09/24/2019, 04/01/2020, 10/04/2020   PNEUMOCOCCAL CONJUGATE-20 07/30/2023   Pfizer Covid-19 Vaccine Bivalent Booster 43yrs & up 05/05/2021, 05/18/2022   Pneumococcal Conjugate-13 07/02/2017   Pneumococcal Polysaccharide-23 05/17/2005   Respiratory Syncytial Virus Vaccine,Recomb Aduvanted(Arexvy) 05/18/2022   Td 10/16/2007   Tdap 03/21/2018   Unspecified SARS-COV-2 Vaccination 07/06/2023   Zoster Recombinant(Shingrix ) 06/10/2019, 12/11/2019     Objective: Vital Signs: There were no vitals taken for this visit.   Physical Exam   Musculoskeletal Exam: ***  CDAI Exam: CDAI Score: -- Patient Global: --; Provider Global: -- Swollen: --; Tender: -- Joint Exam 04/10/2024   No joint exam has been documented for this visit   There is currently no information documented on the homunculus. Go to the Rheumatology activity and complete the homunculus joint exam.  Investigation: No additional  findings.  Imaging: No results found.  Recent Labs: Lab Results  Component Value Date   WBC 6.3 03/06/2024   HGB 12.6 03/06/2024   PLT 237 03/06/2024   NA 141 03/06/2024   K 4.5 03/06/2024   CL 103 03/06/2024   CO2 24 03/06/2024   GLUCOSE 178 (H) 03/06/2024   BUN 46 (H) 03/06/2024   CREATININE 1.30 (H) 03/06/2024   BILITOT 0.2 03/06/2024   ALKPHOS 99 02/01/2024   AST 16 03/06/2024   ALT 11 03/06/2024   PROT 7.0 03/06/2024   ALBUMIN 4.0 02/01/2024   CALCIUM  9.6 03/06/2024   GFRAA 58 (L) 09/23/2020    Speciality Comments: PLQ Eye Exam: 03/05/2023 [facility] f/u in 1 year   Procedures:  No procedures performed Allergies: Sulfa antibiotics, Cymbalta [duloxetine hcl], Demerol, Ivp dye [iodinated contrast media], Nsaids, and Adhesive [tape]   Assessment / Plan:     Visit Diagnoses: Rheumatoid arthritis involving multiple sites with positive rheumatoid factor (HCC)  High risk medication use  Sicca syndrome (HCC)  Raynaud's disease without gangrene  History of total knee replacement, right  Unilateral primary osteoarthritis, left knee  Degeneration of intervertebral disc of lumbar region without discogenic back pain or lower extremity pain  Pressure injury of right buttock, stage 4 (HCC)  Pressure injury of deep tissue of right heel  Fibromyalgia  Chronic SI joint pain  Age-related osteoporosis without current pathological fracture  Status post fracture of femur  History of hyperlipidemia  History of cardiac murmur  History of diabetes mellitus  History of peripheral neuropathy  History of depression  CKD (chronic kidney disease) stage 4, GFR 15-29 ml/min (HCC)  Eosinophilic esophagitis  History of diverticulitis  Orders: No orders of the defined types were placed in this encounter.  No orders of the defined types were placed in this encounter.   Face-to-face time spent with patient was *** minutes. Greater than 50% of time  was spent in counseling and coordination of care.  Follow-Up Instructions: No follow-ups on file.   Waddell CHRISTELLA Craze, PA-C  Note - This record has been created using Dragon software.  Chart creation errors have been sought, but may not always  have been located. Such creation errors do not reflect on  the standard of medical care.

## 2024-03-28 ENCOUNTER — Telehealth: Payer: Self-pay | Admitting: *Deleted

## 2024-03-28 NOTE — Telephone Encounter (Signed)
 Verbal given for below.

## 2024-03-28 NOTE — Telephone Encounter (Signed)
 Copied from CRM #[id number]. Topic: Clinical - Home Health Verbal Orders >> Mar 27, 2024  4:24 PM Delon DASEN wrote: Caller/Agency: Melissa with [facility] Callback Number: [phone] Service Requested: Occupational Therapy Frequency: 2x wk 3 wks then 1x wk for 4 wks Any new concerns about the patient? No

## 2024-03-28 NOTE — Telephone Encounter (Signed)
 LVM giving verbal order for the below request.

## 2024-03-31 ENCOUNTER — Telehealth: Payer: Self-pay

## 2024-03-31 NOTE — Telephone Encounter (Signed)
 Contacted Marcey Duncans and gave the verbal confirmation for patient order.

## 2024-03-31 NOTE — Telephone Encounter (Signed)
 Copied from CRM #[id number]. Topic: Clinical - Home Health Verbal Orders >> Mar 28, 2024  4:24 PM Sophia H wrote: Caller/Agency: Marcey Duncans - [facility]  Callback Number: [phone] Service Requested: Physical Therapy Frequency: 1 time a week for 8 weeks  Any new concerns about the patient? No

## 2024-04-01 ENCOUNTER — Telehealth: Payer: Self-pay

## 2024-04-01 NOTE — Telephone Encounter (Signed)
 Error

## 2024-04-02 ENCOUNTER — Telehealth: Payer: Self-pay

## 2024-04-02 DIAGNOSIS — T8453XD Infection and inflammatory reaction due to internal right knee prosthesis, subsequent encounter: Secondary | ICD-10-CM

## 2024-04-02 MED ORDER — AMOXICILLIN 500 MG PO CAPS: 1000.0000 mg | ORAL_CAPSULE | Freq: Three times a day (TID) | ORAL | 0 refills | Status: DC

## 2024-04-02 NOTE — Telephone Encounter (Signed)
 Patient called to inform she has transferred from  [facility] to an independent facility. After she discharged from SNF noticed that NP did not send correct dose of amoxicillin . Was discharged with Amoxicillin  500 mg TID instead of Amoxicillin  1g TID.  Is almost out of 500mg  dose.  Would like new prescription sent to [facility]. Refills sent Lorenda CHRISTELLA Code, RMA

## 2024-04-03 ENCOUNTER — Telehealth: Payer: Self-pay

## 2024-04-03 DIAGNOSIS — S82851D Displaced trimalleolar fracture of right lower leg, subsequent encounter for closed fracture with routine healing: Secondary | ICD-10-CM | POA: Diagnosis not present

## 2024-04-03 NOTE — Telephone Encounter (Signed)
 Copied from CRM [id number]. Topic: Clinical - Home Health Verbal Orders >> Apr 03, 2024  3:41 PM Berwyn MATSU wrote: Caller/Agency: Marcey with [facility]  Callback Number: [phone]  Service Requested: Physical Therapy  Frequency: Marcey says patient has asked to increase frequency from once a week to twice a week in crease from 1 x a week to 2 x week for 8 weeks.   Any new concerns about the patient? No  Marcey has asked if this can be expedited as he is seeing the patient tomorrow 04/04/24. Please assist further.    Provider is advised

## 2024-04-04 ENCOUNTER — Other Ambulatory Visit: Payer: Self-pay | Admitting: Family Medicine

## 2024-04-04 ENCOUNTER — Telehealth: Payer: Self-pay | Admitting: Family Medicine

## 2024-04-04 MED ORDER — OXYCODONE HCL 5 MG PO TABS: 5.0000 mg | ORAL_TABLET | Freq: Four times a day (QID) | ORAL | 0 refills | Status: AC | PRN

## 2024-04-04 NOTE — Telephone Encounter (Signed)
 Pt is agreeable to you sending in the medication that you recommend.

## 2024-04-04 NOTE — Telephone Encounter (Signed)
 Copied from CRM [id number]. Topic: Clinical - Medication Question >> Apr 04, 2024 11:20 AM Donee H wrote: Reason for CRM: Patient called and stated she is currently recovering from sugery that was done in May. She is currently in independent living and doing pt at home. Patient is stating she is in pain after doing Pt. She would like to know if her pcp can prescribed her ibuprofen  (ADVIL ) 800 MG tablet for pain. She would like for it to be sent to updated pharmacy.

## 2024-04-04 NOTE — Telephone Encounter (Signed)
 With her chronic kidney and heart issues the ibuprofen  wouldn't be a ideal option.  If it is for short term only, a low dose opioid would be a better option.  If she is fine with that I can send it in.

## 2024-04-04 NOTE — Telephone Encounter (Signed)
 Copied from CRM [id number]. Topic: Clinical - Medication Question >> Apr 04, 2024 11:20 AM Donee H wrote: Reason for CRM: Patient called and stated she is currently recovering from sugery that was done in May. She is currently in independent living and doing pt at home. Patient is stating she is in pain after doing Pt. She would like to know if her pcp can prescribed her ibuprofen  (ADVIL ) 800 MG tablet for pain. She would like for it to be sent to updated pharmacy.   [facility] [place], [place] - [address] [place] [address] Phone: [phone] Fax: [phone]

## 2024-04-08 ENCOUNTER — Encounter: Payer: Self-pay | Admitting: Infectious Diseases

## 2024-04-08 ENCOUNTER — Ambulatory Visit: Admitting: Infectious Diseases

## 2024-04-08 ENCOUNTER — Other Ambulatory Visit: Payer: Self-pay

## 2024-04-08 VITALS — BP 111/51 | HR 73 | Temp 97.6°F

## 2024-04-08 DIAGNOSIS — M7989 Other specified soft tissue disorders: Secondary | ICD-10-CM

## 2024-04-08 DIAGNOSIS — M0579 Rheumatoid arthritis with rheumatoid factor of multiple sites without organ or systems involvement: Secondary | ICD-10-CM

## 2024-04-08 DIAGNOSIS — Z79899 Other long term (current) drug therapy: Secondary | ICD-10-CM

## 2024-04-08 DIAGNOSIS — B952 Enterococcus as the cause of diseases classified elsewhere: Secondary | ICD-10-CM

## 2024-04-08 DIAGNOSIS — T8453XD Infection and inflammatory reaction due to internal right knee prosthesis, subsequent encounter: Secondary | ICD-10-CM | POA: Diagnosis not present

## 2024-04-08 MED ORDER — AMOXICILLIN 500 MG PO CAPS: 1000.0000 mg | ORAL_CAPSULE | Freq: Three times a day (TID) | ORAL | 2 refills | Status: DC

## 2024-04-08 NOTE — Addendum Note (Signed)
 Addended by: [name] SHINER on: 04/08/2024 07:33 PM   Modules accepted: Orders

## 2024-04-08 NOTE — Progress Notes (Signed)
 Patient Active Problem List   Diagnosis Date Noted   Medication management 01/08/2024   PICC (peripherally inserted central catheter) in place 01/08/2024   Obesity, Class III, BMI 40-49.9 (morbid obesity) 01/01/2024   Right displaced trimalleolar ankle fracture with syndesmosis disruption 12/26/2023   Fall at home, initial encounter 12/26/2023   History of rheumatoid arthritis 12/26/2023   Hypokalemia 12/26/2023   Itching 12/14/2023   Infection of prosthetic right knee joint (HCC) 12/12/2023   IBS (irritable bowel syndrome) 11/02/2023   IDA (iron  deficiency anemia) 06/05/2023   Normocytic normochromic anemia 06/05/2023   Normocytic anemia 06/01/2023   Situational anxiety 10/15/2022   Hx of Pressure sore of left ischial area, stage IV- healed as of February 2025 09/10/2022   DNR (do not resuscitate) 06/21/2022   Agatston coronary artery calcium  score greater than 400 05/25/2022   Laryngopharyngeal reflux (LPR) 10/10/2021   Chronic maxillary sinusitis 10/10/2021   Prediabetes 02/14/2021   Chronic kidney disease, stage 3b (HCC) 08/01/2020   Post-menopausal 09/02/2019   Essential hypertension 08/20/2018   Recurrent urticaria 05/20/2018   Vitamin D  deficiency 11/20/2017   Hyperlipidemia 11/20/2017   Raynaud's disease without gangrene 03/12/2017   Eosinophilic esophagitis 10/12/2016   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   History of IBS 09/03/2016   Autoimmune disease (HCC) 09/02/2016   Abnormality of gait 05/09/2016   Sjogren syndrome (HCC) 02/29/2016   GERD (gastroesophageal reflux disease) 01/19/2016   Glucose intolerance (impaired glucose tolerance) 01/19/2016   Chronic diastolic CHF (congestive heart failure) (HCC) 04/20/2015   Peripheral neuropathy 02/02/2014   Class 2 obesity due to excess calories with body mass index (BMI) of 36.0 to 36.9 in adult 11/17/2013   Heart murmur    OSA (obstructive sleep apnea)    Rheumatoid arthritis (HCC)    Hiatal hernia     Fibromyalgia    History of PVC (premature ventricular contraction)    Unilateral primary osteoarthritis, left knee 06/22/2011    Patient's Medications  New Prescriptions   No medications on file  Previous Medications   AMOXICILLIN  (AMOXIL ) 500 MG CAPSULE    Take 2 capsules (1,000 mg total) by mouth 3 (three) times daily.   ASPIRIN  EC 81 MG TABLET    Take 1 tablet (81 mg total) by mouth daily. Swallow whole.   CETIRIZINE  (ZYRTEC ) 10 MG TABLET    TAKE 1 TABLET (10 MG TOTAL) BY MOUTH DAILY.   COLESTIPOL  (COLESTID ) 1 G TABLET    Take 1-2 g by mouth daily as needed (ibs flare up).   CYCLOSPORINE  (RESTASIS ) 0.05 % OPHTHALMIC EMULSION    Place 2 drops into both eyes 2 (two) times daily as needed (dry eyes).   FENOFIBRATE  (TRICOR ) 48 MG TABLET    Take 1 tablet (48 mg total) by mouth daily.   FERROUS SULFATE  325 (65 FE) MG TABLET    Take 1 tablet (325 mg total) by mouth every other day.   FOLIC ACID  (FOLVITE ) 1 MG TABLET    Take 1 tablet (1 mg total) by mouth daily.   GABAPENTIN  (NEURONTIN ) 300 MG CAPSULE    TAKE 2 CAPSULES BY MOUTH 2 TIMES DAILY.   HYDROXYCHLOROQUINE  (PLAQUENIL ) 200 MG TABLET    TAKE 1 TABLET (200 MG TOTAL) BY MOUTH DAILY.   HYOSCYAMINE  (ANASPAZ ) 0.125 MG TBDP DISINTERGRATING TABLET    Place 0.125 mg under the tongue every 6 (six) hours as needed for cramping.   LORAZEPAM  (ATIVAN ) 0.5 MG TABLET    Take 1 tablet (0.5  mg total) by mouth at bedtime as needed for sleep. At bedtime prn   METHOCARBAMOL  (ROBAXIN ) 500 MG TABLET    Take 1 tablet (500 mg total) by mouth every 6 (six) hours as needed for muscle spasms.   METOPROLOL  SUCCINATE (TOPROL -XL) 25 MG 24 HR TABLET    TAKE 1/2 TABLET BY MOUTH (12.5 MG TOTAL) DAILY   MULTIPLE VITAMIN (MULTIVITAMIN WITH MINERALS) TABS TABLET    Take 1 tablet by mouth daily.   NONFORMULARY OR COMPOUNDED ITEM    Apply 1 application  topically daily as needed (irritation). Triamcinolone  0.1% & Silvadene cream 1:1- Apply as directed to affected areas as needed    NYSTATIN  CREAM (MYCOSTATIN )    Apply 1 Application topically daily.   OMEPRAZOLE (PRILOSEC) 40 MG CAPSULE    Take 40 mg by mouth daily as needed (acid reflux).   ONDANSETRON  (ZOFRAN ) 4 MG TABLET    Take 1 tablet (4 mg total) by mouth every 6 (six) hours as needed for nausea.   OXYCODONE  (OXY IR/ROXICODONE ) 5 MG IMMEDIATE RELEASE TABLET    Take 1 tablet (5 mg total) by mouth every 6 (six) hours as needed for up to 5 days for severe pain (pain score 7-10) or breakthrough pain.   POLYETHYLENE GLYCOL (MIRALAX ) 17 G PACKET    Take 17 g by mouth daily as needed for mild constipation.   POTASSIUM CHLORIDE  (KLOR-CON ) 10 MEQ TABLET    Take 2 tablets (20 mEq total) by mouth daily.   ROSUVASTATIN  (CRESTOR ) 10 MG TABLET    Take 1 tablet (10 mg total) by mouth daily. Please keep scheduled appointment for future refills. Thank you.   SPIRONOLACTONE  (ALDACTONE ) 25 MG TABLET    TAKE 1 TABLET (25 MG TOTAL) BY MOUTH DAILY.   TORSEMIDE  40 MG TABS    Take 40 mg by mouth daily.  Modified Medications   No medications on file  Discontinued Medications   No medications on file    Subjective: 73 year old female with prior history of basal cell carcinoma, CHF, CKD, DDD, GERD, Sjogren's syndrome, RA not on tx, Raynaud's phenomenon, peripheral neuropathy, fibromyalgia, HTN, HLD, obesity/OSA, OA, prediabetes, right knee arthroplasty, prolonged healing of sacral decub infection (managed with initial antibiotics and wound care) admitted who is here for HFU ( 4/30-5/3) for rt knee PJI.   She got re-admitted 5/14-5/21 after a fall leading to rt ankle #. She underwent ORIF on 5/17. She was seen by Ortho for her rt knee while admitted and was told her wound healing as expected.   5/27 Accompanied by her daughter. She comes from SNF. Receiving IV ampicillin  through PICC. MAR reviewed, not on plaquenil  or any treatment for RA. She reports some hot flushes and need to pee a lot while on antibiotics. Denies urgency or burning of  urine. Denies fevers and chills. Denies nausea, vomiting and diarrhea. Rt knee wound has healed. Has a fu with Ortho for rt ankle Thursday and for Rt knee with Dr Hiram in June. No other concerns.   6/9 Accompanied by daughter. Getting IV ampicillin  through PICC without any concerns like nausea, vomiting, diarrhea. Reports mild redness at PICC insertion site but no other symptoms concerning of infection like warmth, redness, swelling and tenderness. Rt knee wound has healed. She has not seen Dr Hiram after last visit. She has seen Ortho for rft ankle. Labs drawn from 6/9 and last Monday which has been requested from SNF.   7/24 Completed IV ampicillin  on 6/12 after which PICC removed  and started taking Amoxicillin  1g po tid without missing doses or any concerns. Last seen by JONETTA Mountain on 7/3 and next appt is tomorrow and will decide if she can be WB. Seen by Ortho for rt knee on 7/8, Dr Hiram on 6/17.  Seen by Hematology on 6/20 for anemia. Next fu with Dr Hiram is in Aug. No concerns for infection in rt hip and ankle.  She has a fu with Rheum up coming. No complaints.    8/26 Compliant with PO amoxicillin . Currently at independent living. Seen by Dr Hiram 8/5. However, last Friday developed sharp pain in her left knee all of a sudden, some increased swelling/soreness. She is also doing PT and rt knee hurts when moves around and walks, no concerns in rt ankle. No pain in rt knee when sits still. Rt leg is also swollen. Denies fevers, chills. Denies nausea, vomiting, diarrhea. Denies malaise.   Review of Systems: all systems reviewed with pertinent positives and negatives as listed above   Past Medical History:  Diagnosis Date   Agatston coronary artery calcium  score greater than 400 05/2022   coronary Ca score 856   Anemia    Back pain    Blood transfusion    1981   Cancer (HCC)    basal cell carcinoma, x 3   on leg and bottom of spine   Chronic diastolic CHF (congestive heart failure)  (HCC)    diastolic    Chronic kidney disease    CKD3a   COVID    DDD (degenerative disc disease), cervical    DDD (degenerative disc disease), lumbar    Family history of coronary arteriosclerosis- strong fam h/o CAD and early CAD.  07/17/2017   Fibromyalgia    GERD (gastroesophageal reflux disease)    Heart murmur    as a child   Hiatal hernia    sjorgens syndrome   High blood pressure    High cholesterol    IBS (irritable bowel syndrome)    OSA (obstructive sleep apnea)    Osteoarthritis    Peripheral autonomic neuropathy of unknown cause    Pneumonia    Pre-diabetes    PVC (premature ventricular contraction)    Raynaud disease    Rheumatoid arthritis (HCC)    Sjogren's disease (HCC)    Urticaria    Vitamin D  deficiency    Past Surgical History:  Procedure Laterality Date   ABDOMINAL HYSTERECTOMY     BTL, BSO   ADENOIDECTOMY     BREAST EXCISIONAL BIOPSY Left    BREAST SURGERY     mass removal    CHOLECYSTECTOMY     dental implants     DIAGNOSTIC LAPAROSCOPY     x3   FEMUR FRACTURE SURGERY Right    I & D KNEE WITH POLY EXCHANGE Right 12/12/2023   Procedure: IRRIGATION AND DEBRIDEMENT KNEE WITH POLY EXCHANGE;  Surgeon: Melodi Lerner, MD;  Location: [facility];  Service: Orthopedics;  Laterality: Right;   KNEE ARTHROSCOPY     x2   MASS EXCISION Left 03/22/2017   Procedure: EXCISION OF LEFT BREAST MASS;  Surgeon: Belinda Cough, MD;  Location: [facility];  Service: General;  Laterality: Left;   MULTIPLE TOOTH EXTRACTIONS  06/2023   x5   ORIF ANKLE FRACTURE Right 12/29/2023   Procedure: OPEN REDUCTION INTERNAL FIXATION (ORIF) ANKLE FRACTURE;  Surgeon: Mountain Drape, MD;  Location: [facility];  Service: Orthopedics;  Laterality: Right;   ORIF FEMUR FRACTURE Right 06/22/2022   Procedure: RIGHT  OPEN REDUCTION INTERNAL FIXATION (ORIF) DISTAL FEMUR FRACTURE;  Surgeon: Celena Sharper, MD;  Location: [facility];  Service: Orthopedics;  Laterality: Right;   patotid cystectomy      RIGHT/LEFT HEART CATH AND CORONARY ANGIOGRAPHY N/A 07/25/2019   Procedure: RIGHT/LEFT HEART CATH AND CORONARY ANGIOGRAPHY;  Surgeon: Cherrie Toribio SAUNDERS, MD;  Location: [facility];  Service: Cardiovascular;  Laterality: N/A;   TENDON REPAIR  1980   left ankle and tibia   TONSILLECTOMY     TOTAL KNEE ARTHROPLASTY  06/21/2011   Procedure: TOTAL KNEE ARTHROPLASTY;  Surgeon: Dempsey LULLA Moan;  Location: [facility];  Service: Orthopedics;  Laterality: Right;   TUBAL LIGATION      Social History   Tobacco Use   Smoking status: Never    Passive exposure: Past   Smokeless tobacco: Never  Vaping Use   Vaping status: Never Used  Substance Use Topics   Alcohol  use: Yes    Comment: occ   Drug use: No    Family History  Problem Relation Age of Onset   Alzheimer's disease Mother    Heart attack Mother    Hypertension Mother    Glaucoma Mother    High Cholesterol Mother    Heart disease Mother    Depression Mother    Cancer Father    High Cholesterol Father    Heart disease Father    Sleep apnea Father    Breast cancer Maternal Aunt    Heart attack Brother    Heart disease Brother    Glaucoma Brother    Hyperlipidemia Brother    Glaucoma Brother    Asthma Son    Allergic rhinitis Neg Hx    Angioedema Neg Hx    Eczema Neg Hx    Urticaria Neg Hx    Neuropathy Neg Hx     Allergies  Allergen Reactions   Sulfa Antibiotics Hives and Itching    Flushing   Cymbalta [Duloxetine Hcl] Other (See Comments)    Manic reaction   Demerol Hives and Other (See Comments)    Fever    Ivp Dye [Iodinated Contrast Media] Hives and Other (See Comments)    Fever   Nsaids Other (See Comments)    CKD stage 3b - GFR 41   Adhesive [Tape] Rash    Health Maintenance  Topic Date Due   Colonoscopy  11/21/2022   Medicare Annual Wellness (AWV)  12/28/2023   COVID-19 Vaccine (8 - Pfizer risk 2024-25 season) 01/03/2024   INFLUENZA VACCINE  03/14/2024   MAMMOGRAM  11/05/2025   DTaP/Tdap/Td (3  - Td or Tdap) 03/21/2028   Pneumococcal Vaccine: 50+ Years  Completed   DEXA SCAN  Completed   Hepatitis C Screening  Completed   Zoster Vaccines- Shingrix   Completed   HPV VACCINES  Aged Out   Meningococcal B Vaccine  Aged Out    Objective: BP (!) 111/51   Pulse 73   Temp 97.6 F (36.4 C) (Temporal)   SpO2 95% '  Physical Exam Constitutional:      Appearance: Normal appearance.  HENT:     Head: Normocephalic and atraumatic.      Mouth: Mucous membranes are moist.  Eyes:    Conjunctiva/sclera: Conjunctivae normal.     Pupils: Pupils are equal, round, and b/l symmetrical    Cardiovascular:     Rate and Rhythm: Normal rate and regular rhythm.     Heart sounds:   Pulmonary:     Effort: Pulmonary effort is  normal.     Breath sounds:   Abdominal:     General: Non distended     Palpations:   Musculoskeletal:        General: sitting in the wheelchair, Rt foot and ankle wrapped in a  bandage. Rt knee surgical site has healed. RT knee with some swelling, no significant warmth, mild tenderness, no erythema. ROM is good. RT leg swollen than left. No cellulitis in rt lower extremity  Skin:    General: Skin is warm and dry.     Comments: no rashes   Neurological:     General: grossly non focal     Mental Status: awake, alert and oriented to person, place, and time.   Psychiatric:        Mood and Affect: Mood normal.   Lab Results Lab Results  Component Value Date   WBC 6.3 03/06/2024   HGB 12.6 03/06/2024   HCT 39.0 03/06/2024   MCV 89.0 03/06/2024   PLT 237 03/06/2024    Lab Results  Component Value Date   CREATININE 1.30 (H) 03/06/2024   BUN 46 (H) 03/06/2024   NA 141 03/06/2024   K 4.5 03/06/2024   CL 103 03/06/2024   CO2 24 03/06/2024    Lab Results  Component Value Date   ALT 11 03/06/2024   AST 16 03/06/2024   ALKPHOS 99 02/01/2024   BILITOT 0.2 03/06/2024    Lab Results  Component Value Date   CHOL 120 10/23/2023   HDL 60 10/23/2023   LDLCALC  38 10/23/2023   TRIG 125 10/23/2023   CHOLHDL 2.0 10/23/2023   No results found for: LABRPR, RPRTITER No results found for: HIV1RNAQUANT, HIV1RNAVL, CD4TABS   Assessment/Plan # Right knee PJI - 3/28 aspiration in Ortho clinic with E faecalis - 4/30 I&D with liner exchange.  Or culture with E faecalis - No antibiotics prior to I&D - s/ 6 weeks of IV ampicillin  through 6/12,then switch PO amoxicillin  1g po tid starting 6/13 for 6 months total   # Rt knee/Leg swelling  - does not seem septic arthritis clinically  - CBC, BMP, ESR and CRP today  - US  of rt leg to r/o DVT - Discussed to fu  with Ortho for +/- aspiration  - Continue PO amoxicillin  1g po tid as is  - Discussed signs and symptoms to seek immediate attention like increased swelling, redness, significant warmth, fevers and chills.  - fu pending labs and imaging   # Rt ankle #  - s/p ORIF on 5/17  - no concerns here - fu with Dr Barton, last seen 8/21  # RA  - not on any treatment per patient report - Fu with Rheumatology   I spent 25 minutes involved in face-to-face and non-face-to-face activities for this patient on the day of the visit. Professional time spent includes the following activities: Preparing to see the patient (review of tests), Reviewing prior notes from Dr Barton 8/21, Dr Hiram 8/5,  Performing a medically appropriate examination and evaluation, Ordering labs/Imaging,  Documenting clinical information in the EMR, Independently interpreting results (not separately reported), Communicating results to the patient, Counseling and educating the patient.   Of note, portions of this note may have been created with voice recognition software. While this note has been edited for accuracy, occasional wrong-word or 'sound-a-like' substitutions may have occurred due to the inherent limitations of voice recognition software.   Annalee Joseph, MD [facility]  04/08/2024, 3:25 PM

## 2024-04-09 ENCOUNTER — Ambulatory Visit: Payer: Self-pay | Admitting: Infectious Diseases

## 2024-04-09 ENCOUNTER — Ambulatory Visit (HOSPITAL_COMMUNITY)
Admission: RE | Admit: 2024-04-09 | Discharge: 2024-04-09 | Disposition: A | Discharge status: Home / Self Care | Source: Ambulatory Visit | Attending: Infectious Diseases | Admitting: Infectious Diseases

## 2024-04-09 ENCOUNTER — Encounter: Payer: Self-pay | Admitting: Infectious Diseases

## 2024-04-09 DIAGNOSIS — R7982 Elevated C-reactive protein (CRP): Secondary | ICD-10-CM | POA: Diagnosis not present

## 2024-04-09 DIAGNOSIS — Z5189 Encounter for other specified aftercare: Secondary | ICD-10-CM | POA: Diagnosis not present

## 2024-04-09 DIAGNOSIS — M7989 Other specified soft tissue disorders: Secondary | ICD-10-CM | POA: Diagnosis not present

## 2024-04-09 DIAGNOSIS — R600 Localized edema: Secondary | ICD-10-CM | POA: Diagnosis not present

## 2024-04-09 LAB — SEDIMENTATION RATE: Sed Rate: 70 mm/h — ABNORMAL HIGH (ref 0–30)

## 2024-04-09 LAB — CBC
HCT: 36.9 % (ref 35.0–45.0)
Hemoglobin: 12.2 g/dL (ref 11.7–15.5)
MCH: 29.3 pg (ref 27.0–33.0)
MCHC: 33.1 g/dL (ref 32.0–36.0)
MCV: 88.7 fL (ref 80.0–100.0)
MPV: 11.6 fL (ref 7.5–12.5)
Platelets: 278 Thousand/uL (ref 140–400)
RBC: 4.16 Million/uL (ref 3.80–5.10)
RDW: 14.4 % (ref 11.0–15.0)
WBC: 7.6 Thousand/uL (ref 3.8–10.8)

## 2024-04-09 LAB — BASIC METABOLIC PANEL WITH GFR
BUN/Creatinine Ratio: 16 (calc) (ref 6–22)
BUN: 25 mg/dL (ref 7–25)
CO2: 29 mmol/L (ref 20–32)
Calcium: 8.9 mg/dL (ref 8.6–10.4)
Chloride: 99 mmol/L (ref 98–110)
Creat: 1.6 mg/dL — ABNORMAL HIGH (ref 0.60–1.00)
Glucose, Bld: 115 mg/dL — ABNORMAL HIGH (ref 65–99)
Potassium: 3.8 mmol/L (ref 3.5–5.3)
Sodium: 138 mmol/L (ref 135–146)
eGFR: 34 mL/min/1.73m2 — ABNORMAL LOW (ref >=60)

## 2024-04-09 LAB — C-REACTIVE PROTEIN: CRP: 51.8 mg/L — ABNORMAL HIGH (ref <8.0)

## 2024-04-09 NOTE — Telephone Encounter (Signed)
 Spoke with Macario, discussed elevated creatinine and encouraged her to stay hydrated with water.   Discussed elevated inflammatory markers and the need to follow up with Dr. Hiram for possible right knee aspiration. Their office called her while we were on the phone so she disconnected our call to speak with them.   Will send MyChart message regarding repeat lab appointment.   Eilyn Polack, BSN, RN

## 2024-04-10 ENCOUNTER — Ambulatory Visit: Admitting: Physician Assistant

## 2024-04-10 DIAGNOSIS — L89616 Pressure-induced deep tissue damage of right heel: Secondary | ICD-10-CM

## 2024-04-10 DIAGNOSIS — K200 Eosinophilic esophagitis: Secondary | ICD-10-CM

## 2024-04-10 DIAGNOSIS — Z8669 Personal history of other diseases of the nervous system and sense organs: Secondary | ICD-10-CM

## 2024-04-10 DIAGNOSIS — M797 Fibromyalgia: Secondary | ICD-10-CM

## 2024-04-10 DIAGNOSIS — M810 Age-related osteoporosis without current pathological fracture: Secondary | ICD-10-CM

## 2024-04-10 DIAGNOSIS — I7300 Raynaud's syndrome without gangrene: Secondary | ICD-10-CM

## 2024-04-10 DIAGNOSIS — N184 Chronic kidney disease, stage 4 (severe): Secondary | ICD-10-CM

## 2024-04-10 DIAGNOSIS — Z96651 Presence of right artificial knee joint: Secondary | ICD-10-CM

## 2024-04-10 DIAGNOSIS — Z8679 Personal history of other diseases of the circulatory system: Secondary | ICD-10-CM

## 2024-04-10 DIAGNOSIS — Z8659 Personal history of other mental and behavioral disorders: Secondary | ICD-10-CM

## 2024-04-10 DIAGNOSIS — G8929 Other chronic pain: Secondary | ICD-10-CM

## 2024-04-10 DIAGNOSIS — Z8719 Personal history of other diseases of the digestive system: Secondary | ICD-10-CM

## 2024-04-10 DIAGNOSIS — Z8781 Personal history of (healed) traumatic fracture: Secondary | ICD-10-CM

## 2024-04-10 DIAGNOSIS — M1712 Unilateral primary osteoarthritis, left knee: Secondary | ICD-10-CM

## 2024-04-10 DIAGNOSIS — Z79899 Other long term (current) drug therapy: Secondary | ICD-10-CM

## 2024-04-10 DIAGNOSIS — L89314 Pressure ulcer of right buttock, stage 4: Secondary | ICD-10-CM

## 2024-04-10 DIAGNOSIS — Z8639 Personal history of other endocrine, nutritional and metabolic disease: Secondary | ICD-10-CM

## 2024-04-10 DIAGNOSIS — M3500 Sicca syndrome, unspecified: Secondary | ICD-10-CM

## 2024-04-10 DIAGNOSIS — M51369 Other intervertebral disc degeneration, lumbar region without mention of lumbar back pain or lower extremity pain: Secondary | ICD-10-CM

## 2024-04-10 DIAGNOSIS — M0579 Rheumatoid arthritis with rheumatoid factor of multiple sites without organ or systems involvement: Secondary | ICD-10-CM

## 2024-04-11 ENCOUNTER — Other Ambulatory Visit: Payer: PPO

## 2024-04-17 DIAGNOSIS — Z5189 Encounter for other specified aftercare: Secondary | ICD-10-CM | POA: Diagnosis not present

## 2024-04-18 DIAGNOSIS — G4733 Obstructive sleep apnea (adult) (pediatric): Secondary | ICD-10-CM | POA: Diagnosis not present

## 2024-04-21 ENCOUNTER — Encounter: Payer: Self-pay | Admitting: Neurology

## 2024-04-21 ENCOUNTER — Ambulatory Visit: Admitting: Neurology

## 2024-04-21 VITALS — BP 120/60 | HR 74 | Wt 212.0 lb

## 2024-04-21 DIAGNOSIS — R269 Unspecified abnormalities of gait and mobility: Secondary | ICD-10-CM

## 2024-04-21 DIAGNOSIS — G629 Polyneuropathy, unspecified: Secondary | ICD-10-CM

## 2024-04-21 MED ORDER — DULOXETINE HCL 30 MG PO CPEP: 30.0000 mg | ORAL_CAPSULE | Freq: Every day | ORAL | 11 refills | Status: AC

## 2024-04-21 NOTE — Progress Notes (Unsigned)
 Chief Complaint  Patient presents with   Follow-up    RM 15 Peripheral polyneuropathy     ASSESSMENT AND PLAN  [name] is a 73 y.o. female   Long history of peripheral neuropathy, worsening pain of bilateral feet and hands  Since her complicated right knee issues, right ankle fracture, surgery, hold off rheumatoid arthritis medication treatment, worsening kidney function,  Keep current dose of gabapentin  300 mg 2 tablets twice a day,  Add on low-dose Cymbalta  30 mg every day, she reported manic episode when tried Cymbalta  many years ago, will stop low-dose, call for progress report in 3 to 4 weeks, if she is doing well, will titrating to higher dose of Cymbalta  60 mg daily, if it does not help her, causing side effect, may consider low-dose of Trileptal up to 150 mg twice a day   Return to clinic in 3 months virtual visit  DIAGNOSTIC DATA (LABS, IMAGING, TESTING) - I reviewed patient records, labs, notes, testing and imaging myself where available.   MEDICAL HISTORY:  YOCELYN BROCIOUS, seen in request by  Chandra Toribio POUR,     History is obtained from the patient and review of electronic medical records. I personally reviewed pertinent available imaging films in PACS.   PMHx of  PCVs Chronic knee, ankle pain Chronic kidney disease Neuropathy HLD Obesity CAD Rheumatoid Arthritis, off plaquenil  since right ankle surgery  Right femur fracture in 2023, had surgery Sacral decubitus ulcer required wound center Jan 2024- Feb 2025,   Wheelchair since May, infection of right knee gram neagtive, ID, antibiotics, had hx of knee replacement in 2012, Apirl 30 2025, redo of right knee repalcement,   Seh fell right nakle in on May 14th, has surgery, only alb eto weight bear on right leg since end of August, non weight bearing,   Live to independent living, care giver daily few house, x2 hours  See Dr. Jenel in 2015,    Neuropahty involving feet fingers,  1999, feet, toes, in  her leg, nerve feel normal in her legs, concert block, never goes away,   Gabapenint 600mg  bid, to surgery, decline in repsonse, getting worse,   Toes cripping pain, nerver goes away, good relive before,   Finger Reynolds, tingling achy all the times, hard for her,   Cymbalta - Manic    PHYSICAL EXAM:   Vitals:   04/21/24 1117  BP: 120/60  Pulse: 74  SpO2: 96%  Weight: 212 lb (96.2 kg)   Not recorded     Body mass index is 38.16 kg/m.  PHYSICAL EXAMNIATION:  Gen: NAD, conversant, well nourised, well groomed                     Cardiovascular: Regular rate rhythm, no peripheral edema, warm, nontender. Eyes: Conjunctivae clear without exudates or hemorrhage Neck: Supple, no carotid bruits. Pulmonary: Clear to auscultation bilaterally   NEUROLOGICAL EXAM:  MENTAL STATUS: Speech/cognition: Obese, in wheelchair, awake, alert, oriented to history taking and casual conversation CRANIAL NERVES: CN II: Visual fields are full to confrontation. Pupils are round equal and briskly reactive to light. CN III, IV, VI: extraocular movement are normal. No ptosis. CN V: Facial sensation is intact to light touch CN VII: Face is symmetric with normal eye closure  CN VIII: Hearing is normal to causal conversation. CN IX, X: Phonation is normal. CN XI: Head turning and shoulder shrug are intact  MOTOR: Mild bilateral finger abduction weakness, significant lower extremity pitting edema,  mild to moderate right hip flexion weakness limited by right knee pain, otherwise no significant muscle weakness,  REFLEXES: Present at upper extremity, hypoactive, absent at bilateral knee and ankle,     SENSORY: Preserved to toe vibratory sensation, length-dependent light touch decreased pinprick to below knee level  COORDINATION: There is no trunk or limb dysmetria noted.  GAIT/STANCE: Deferred  REVIEW OF SYSTEMS:  Full 14 system review of systems performed and notable only for as above All  other review of systems were negative.   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 180 capsule 2   aspirin  EC 81 MG tablet Take 1 tablet (81 mg total) by mouth daily. Swallow whole. 30 tablet 12   cetirizine  (ZYRTEC ) 10 MG tablet TAKE 1 TABLET (10 MG TOTAL) BY MOUTH DAILY. 90 tablet 1   colestipol  (COLESTID ) 1 g tablet Take 1-2 g by mouth daily as needed (ibs flare up).     cycloSPORINE  (RESTASIS ) 0.05 % ophthalmic emulsion Place 2 drops into both eyes 2 (two) times daily as needed (dry eyes).     fenofibrate  (TRICOR ) 48 MG tablet Take 1 tablet (48 mg total) by mouth daily. 90 tablet 0   ferrous sulfate  325 (65 FE) MG tablet Take 1 tablet (325 mg total) by mouth every other day. 14 tablet 0   folic acid  (FOLVITE ) 1 MG tablet Take 1 tablet (1 mg total) by mouth daily. 30 tablet 5   gabapentin  (NEURONTIN ) 300 MG capsule TAKE 2 CAPSULES BY MOUTH 2 TIMES DAILY. 360 capsule 1   [Paused] hydroxychloroquine  (PLAQUENIL ) 200 MG tablet TAKE 1 TABLET (200 MG TOTAL) BY MOUTH DAILY. 90 tablet 0   hyoscyamine  (ANASPAZ ) 0.125 MG TBDP disintergrating tablet Place 0.125 mg under the tongue every 6 (six) hours as needed for cramping.     LORazepam  (ATIVAN ) 0.5 MG tablet Take 1 tablet (0.5 mg total) by mouth at bedtime as needed for sleep. At bedtime prn 3 tablet 0   methocarbamol  (ROBAXIN ) 500 MG tablet Take 1 tablet (500 mg total) by mouth every 6 (six) hours as needed for muscle spasms. 40 tablet 0    metoprolol  succinate (TOPROL -XL) 25 MG 24 hr tablet TAKE 1/2 TABLET BY MOUTH (12.5 MG TOTAL) DAILY 45 tablet 3   Multiple Vitamin (MULTIVITAMIN WITH MINERALS) TABS tablet Take 1 tablet by mouth daily.     NONFORMULARY OR COMPOUNDED ITEM Apply 1 application  topically daily as needed (irritation). Triamcinolone  0.1% & Silvadene cream 1:1- Apply as directed to affected areas as needed     nystatin  cream (MYCOSTATIN ) Apply 1 Application topically daily.     omeprazole (PRILOSEC) 40 MG capsule Take 40 mg by mouth daily as needed (acid reflux).     ondansetron  (ZOFRAN ) 4 MG tablet Take 1 tablet (4 mg total) by mouth every 6 (six) hours as needed for nausea. 20 tablet 0   polyethylene glycol (MIRALAX ) 17  g packet Take 17 g by mouth daily as needed for mild constipation.     potassium chloride  (KLOR-CON ) 10 MEQ tablet Take 2 tablets (20 mEq total) by mouth daily. 180 tablet 3   rosuvastatin  (CRESTOR ) 10 MG tablet Take 1 tablet (10 mg total) by mouth daily. Please keep scheduled appointment for future refills. Thank you. 30 tablet 1   spironolactone  (ALDACTONE ) 25 MG tablet TAKE 1 TABLET (25 MG TOTAL) BY MOUTH DAILY. 90 tablet 0   torsemide  40 MG TABS Take 40 mg by mouth daily. 30 tablet 0   oxyCODONE  (OXY IR/ROXICODONE ) 5 MG immediate release tablet Take 1 tablet (5 mg total) by mouth every 6 (six) hours as needed for up to 5 days for severe pain (pain score 7-10) or breakthrough pain.     No current facility-administered medications for this visit.    PAST MEDICAL HISTORY: Past Medical History:  Diagnosis Date   Agatston coronary artery calcium  score greater than 400 05/2022   coronary Ca score 856   Anemia    Back pain    Blood transfusion    1981   Cancer (HCC)    basal cell carcinoma, x 3   on leg and bottom of spine   Chronic diastolic CHF (congestive heart failure) (HCC)    diastolic    Chronic kidney disease    CKD3a   COVID    DDD (degenerative disc disease), cervical    DDD  (degenerative disc disease), lumbar    Family history of coronary arteriosclerosis- strong fam h/o CAD and early CAD.  07/17/2017   Fibromyalgia    GERD (gastroesophageal reflux disease)    Heart murmur    as a child   Hiatal hernia    sjorgens syndrome   High blood pressure    High cholesterol    IBS (irritable bowel syndrome)    OSA (obstructive sleep apnea)    Osteoarthritis    Peripheral autonomic neuropathy of unknown cause    Pneumonia    Pre-diabetes    PVC (premature ventricular contraction)    Raynaud disease    Rheumatoid arthritis (HCC)    Sjogren's disease (HCC)    Urticaria    Vitamin D  deficiency     PAST SURGICAL HISTORY: Past Surgical History:  Procedure Laterality Date   ABDOMINAL HYSTERECTOMY     BTL, BSO   ADENOIDECTOMY     BREAST EXCISIONAL BIOPSY Left    BREAST SURGERY     mass removal    CHOLECYSTECTOMY     dental implants     DIAGNOSTIC LAPAROSCOPY     x3   FEMUR FRACTURE SURGERY Right    I & D KNEE WITH POLY EXCHANGE Right 12/12/2023   Procedure: IRRIGATION AND DEBRIDEMENT KNEE WITH POLY EXCHANGE;  Surgeon: Melodi Lerner, MD;  Location: [facility];  Service: Orthopedics;  Laterality: Right;   KNEE ARTHROSCOPY     x2   MASS EXCISION Left 03/22/2017   Procedure: EXCISION OF LEFT BREAST MASS;  Surgeon: Belinda Cough, MD;  Location: [facility];  Service: General;  Laterality: Left;   MULTIPLE TOOTH EXTRACTIONS  06/2023   x5   ORIF ANKLE FRACTURE Right 12/29/2023   Procedure: OPEN REDUCTION INTERNAL FIXATION (ORIF) ANKLE FRACTURE;  Surgeon: Barton Drape, MD;  Location: [facility];  Service: Orthopedics;  Laterality: Right;   ORIF FEMUR FRACTURE Right 06/22/2022   Procedure: RIGHT OPEN REDUCTION INTERNAL FIXATION (ORIF) DISTAL FEMUR FRACTURE;  Surgeon: Celena Sharper, MD;  Location: [facility];  Service: Orthopedics;  Laterality: Right;   patotid cystectomy     RIGHT/LEFT HEART CATH AND CORONARY ANGIOGRAPHY N/A 07/25/2019   Procedure: RIGHT/LEFT HEART CATH  AND CORONARY ANGIOGRAPHY;  Surgeon: Cherrie Toribio SAUNDERS, MD;  Location: [facility];  Service: Cardiovascular;  Laterality: N/A;   TENDON REPAIR  1980   left ankle and tibia   TONSILLECTOMY     TOTAL KNEE ARTHROPLASTY  06/21/2011   Procedure: TOTAL KNEE ARTHROPLASTY;  Surgeon: Dempsey LULLA Moan;  Location: [facility];  Service: Orthopedics;  Laterality: Right;   TUBAL LIGATION      FAMILY HISTORY: Family History  Problem Relation Age of Onset   Alzheimer's disease Mother    Heart attack Mother    Hypertension Mother    Glaucoma Mother    High Cholesterol Mother    Heart disease Mother    Depression Mother    Cancer Father    High Cholesterol Father    Heart disease Father    Sleep apnea Father    Breast cancer Maternal Aunt    Heart attack Brother    Heart disease Brother    Glaucoma Brother    Hyperlipidemia Brother    Glaucoma Brother    Asthma Son    Allergic rhinitis Neg Hx    Angioedema Neg Hx    Eczema Neg Hx    Urticaria Neg Hx    Neuropathy Neg Hx     SOCIAL HISTORY: Social History   Socioeconomic History   Marital status: Widowed    Spouse name: Not on file   Number of children: 2   Years of education: AS   Highest education level: Bachelor's degree (e.g., BA, AB, BS)  Occupational History   Occupation: retired Engineer, civil (consulting)   Occupation: RN  Tobacco Use   Smoking status: Never    Passive exposure: Past   Smokeless tobacco: Never  Vaping Use   Vaping status: Never Used  Substance and Sexual Activity   Alcohol  use: Yes    Comment: occ   Drug use: No   Sexual activity: Never  Other Topics Concern   Not on file  Social History Narrative   Lives at home alone   Right-handed   Drinks 1 or less cups of coffee and 2 servings of either tea or soda per day   Social Drivers of Health   Financial Resource Strain: Low Risk  (07/29/2023)   Overall Financial Resource Strain (CARDIA)    Difficulty of Paying Living Expenses: Not hard at all  Food Insecurity:  No Food Insecurity (12/26/2023)   Hunger Vital Sign    Worried About Running Out of Food in the Last Year: Never true    Ran Out of Food in the Last Year: Never true  Transportation Needs: No Transportation Needs (12/26/2023)   PRAPARE - Administrator, Civil Service (Medical): No    Lack of Transportation (Non-Medical): No  Physical Activity: Inactive (07/29/2023)   Exercise Vital Sign    Days of Exercise per Week: 0 days    Minutes of Exercise per Session: 30 min  Stress: No Stress Concern Present (07/29/2023)   [employer] of Occupational Health - Occupational Stress Questionnaire    Feeling of Stress : Only a little  Social Connections: Moderately Integrated (12/26/2023)   Social Connection and Isolation Panel    Frequency of Communication with Friends and Family: More than three times a week    Frequency of Social Gatherings with Friends and Family: Three  times a week    Attends Religious Services: More than 4 times per year    Active Member of Clubs or Organizations: Yes    Attends Banker Meetings: More than 4 times per year    Marital Status: Widowed  Intimate Partner Violence: Not At Risk (12/26/2023)   Humiliation, Afraid, Rape, and Kick questionnaire    Fear of Current or Ex-Partner: No    Emotionally Abused: No    Physically Abused: No    Sexually Abused: No      Modena Callander, M.D. Ph.D.  [facility] [address], [address] [place], [place] [address] Ph: [phone] Fax: [phone]  CC:  Chandra Toribio POUR, MD [address] [place],  [place] [address]  Chandra Toribio POUR, MD

## 2024-04-22 ENCOUNTER — Ambulatory Visit: Admitting: Pulmonary Disease

## 2024-04-28 DIAGNOSIS — R600 Localized edema: Secondary | ICD-10-CM | POA: Diagnosis not present

## 2024-04-28 DIAGNOSIS — S71109A Unspecified open wound, unspecified thigh, initial encounter: Secondary | ICD-10-CM | POA: Diagnosis not present

## 2024-04-28 DIAGNOSIS — E876 Hypokalemia: Secondary | ICD-10-CM | POA: Diagnosis not present

## 2024-04-28 DIAGNOSIS — I5032 Chronic diastolic (congestive) heart failure: Secondary | ICD-10-CM | POA: Diagnosis not present

## 2024-04-28 DIAGNOSIS — S71009A Unspecified open wound, unspecified hip, initial encounter: Secondary | ICD-10-CM | POA: Diagnosis not present

## 2024-04-28 DIAGNOSIS — N1831 Chronic kidney disease, stage 3a: Secondary | ICD-10-CM | POA: Diagnosis not present

## 2024-04-29 DIAGNOSIS — S82851A Displaced trimalleolar fracture of right lower leg, initial encounter for closed fracture: Secondary | ICD-10-CM | POA: Diagnosis not present

## 2024-04-29 NOTE — Progress Notes (Unsigned)
 Office Visit Note  Patient: [name]             Date of Birth: 1951-04-15           MRN: [id number]             PCP: Chandra Toribio POUR, MD Referring: Chandra Toribio POUR, MD Visit Date: 05/13/2024 Occupation: @GUAROCC @  Subjective:  Discuss resuming Plaquenil   History of Present Illness: [name] is a 73 y.o. female with history of seropositive rheumatoid arthritis.  Patient has been off of Plaquenil  since April 2025.  Patient states that since her last office visit she has had several new concerns.  On 12/12/2023 she underwent irrigation and debridement with poly exchange due to an infected right total knee replacement.  She has remained under the close clear of Dr. Melodi.  Unfortunately after the surgery she had a fall leading to a right ankle fracture.  She underwent an open reduction internal fixation of the right ankle on 12/29/2023 by Dr. Barton.  She has remained nonweightbearing and has been working with physical therapy and Occupational Therapy.  She is now living at [facility].  Patient has remained off of Plaquenil  due to the concern for healing/infection.  She has remained under the close care of Dr. [name] and is prescribed amoxicillin  1 g 3 times daily. Patient has established care with Dr. Onita and has been restarted on Cymbalta .  She is taking Cymbalta  30 mg daily which she has been tolerating.  Patient continues to have chronic pain and stiffness involving multiple joints.  She feels that her rheumatoid arthritis is not adequately controlled since she has been off of Plaquenil .  She presents today to discuss restarting Plaquenil  or discussing other treatment alternatives.      Activities of Daily Living:  Patient reports morning stiffness for 30 minutes.   Patient Reports nocturnal pain.  Difficulty dressing/grooming: Reports Difficulty climbing stairs: Reports Difficulty getting out of chair: Reports Difficulty using hands for taps, buttons,  cutlery, and/or writing: Reports  Review of Systems  Constitutional:  Positive for fatigue.  HENT:  Positive for mouth dryness. Negative for mouth sores.   Eyes:  Positive for dryness.  Respiratory:  Negative for shortness of breath.   Cardiovascular:  Positive for palpitations. Negative for chest pain.  Gastrointestinal:  Negative for blood in stool, constipation and diarrhea.  Endocrine: Negative for increased urination.  Genitourinary:  Negative for involuntary urination.  Musculoskeletal:  Positive for joint pain, gait problem, joint pain, joint swelling, myalgias, morning stiffness, muscle tenderness and myalgias. Negative for muscle weakness.  Skin:  Negative for color change, rash, hair loss and sensitivity to sunlight.  Allergic/Immunologic: Positive for susceptible to infections.  Neurological:  Negative for dizziness and headaches.  Hematological:  Positive for swollen glands.  Psychiatric/Behavioral:  Negative for depressed mood and sleep disturbance. The patient is not nervous/anxious.     PMFS History:  Patient Active Problem List   Diagnosis Date Noted   Leg swelling 04/08/2024   Medication management 01/08/2024   Obesity, Class III, BMI 40-49.9 (morbid obesity) 01/01/2024   Right displaced trimalleolar ankle fracture with syndesmosis disruption 12/26/2023   Fall at home, initial encounter 12/26/2023   History of rheumatoid arthritis 12/26/2023   Hypokalemia 12/26/2023   Itching 12/14/2023   Infection of prosthetic right knee joint 12/12/2023   IBS (irritable bowel syndrome) 11/02/2023   IDA (iron  deficiency anemia) 06/05/2023   Normocytic normochromic anemia 06/05/2023   Normocytic anemia 06/01/2023  Situational anxiety 10/15/2022   Hx of Pressure sore of left ischial area, stage IV- healed as of February 2025 09/10/2022   DNR (do not resuscitate) 06/21/2022   Agatston coronary artery calcium  score greater than 400 05/25/2022   Laryngopharyngeal reflux (LPR)  10/10/2021   Chronic maxillary sinusitis 10/10/2021   Prediabetes 02/14/2021   Chronic kidney disease, stage 3b (HCC) 08/01/2020   Post-menopausal 09/02/2019   Essential hypertension 08/20/2018   Recurrent urticaria 05/20/2018   Vitamin D  deficiency 11/20/2017   Hyperlipidemia 11/20/2017   Raynaud's disease without gangrene 03/12/2017   Eosinophilic esophagitis 10/12/2016   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   History of IBS 09/03/2016   Autoimmune disease 09/02/2016   Abnormality of gait 05/09/2016   Sjogren syndrome 02/29/2016   GERD (gastroesophageal reflux disease) 01/19/2016   Glucose intolerance (impaired glucose tolerance) 01/19/2016   Chronic diastolic CHF (congestive heart failure) (HCC) 04/20/2015   Peripheral neuropathy 02/02/2014   Class 2 obesity due to excess calories with body mass index (BMI) of 36.0 to 36.9 in adult 11/17/2013   Heart murmur    OSA (obstructive sleep apnea)    Rheumatoid arthritis (HCC)    Hiatal hernia    Fibromyalgia    History of PVC (premature ventricular contraction)    Unilateral primary osteoarthritis, left knee 06/22/2011    Past Medical History:  Diagnosis Date   Agatston coronary artery calcium  score greater than 400 05/2022   coronary Ca score 856   Anemia    Back pain    Blood transfusion    1981   Cancer (HCC)    basal cell carcinoma, x 3   on leg and bottom of spine   Chronic diastolic CHF (congestive heart failure) (HCC)    diastolic    Chronic kidney disease    CKD3a   COVID    DDD (degenerative disc disease), cervical    DDD (degenerative disc disease), lumbar    Family history of coronary arteriosclerosis- strong fam h/o CAD and early CAD.  07/17/2017   Fibromyalgia    GERD (gastroesophageal reflux disease)    Heart murmur    as a child   Hiatal hernia    sjorgens syndrome   High blood pressure    High cholesterol    IBS (irritable bowel syndrome)    OSA (obstructive sleep apnea)    Osteoarthritis     Peripheral autonomic neuropathy of unknown cause    Pneumonia    Pre-diabetes    PVC (premature ventricular contraction)    Raynaud disease    Rheumatoid arthritis (HCC)    Sjogren's disease    Urticaria    Vitamin D  deficiency     Family History  Problem Relation Age of Onset   Alzheimer's disease Mother    Heart attack Mother    Hypertension Mother    Glaucoma Mother    High Cholesterol Mother    Heart disease Mother    Depression Mother    Cancer Father    High Cholesterol Father    Heart disease Father    Sleep apnea Father    Heart attack Brother    Heart disease Brother    Glaucoma Brother    Hyperlipidemia Brother    Glaucoma Brother    Breast cancer Maternal Aunt    Asthma Son    Allergic rhinitis Neg Hx    Angioedema Neg Hx    Eczema Neg Hx    Urticaria Neg Hx    Neuropathy Neg Hx  Past Surgical History:  Procedure Laterality Date   ABDOMINAL HYSTERECTOMY     BTL, BSO   ADENOIDECTOMY     BREAST EXCISIONAL BIOPSY Left    BREAST SURGERY     mass removal    CHOLECYSTECTOMY     dental implants     DIAGNOSTIC LAPAROSCOPY     x3   FEMUR FRACTURE SURGERY Right    I & D KNEE WITH POLY EXCHANGE Right 12/12/2023   Procedure: IRRIGATION AND DEBRIDEMENT KNEE WITH POLY EXCHANGE;  Surgeon: Melodi Lerner, MD;  Location: [facility];  Service: Orthopedics;  Laterality: Right;   KNEE ARTHROSCOPY     x2   MASS EXCISION Left 03/22/2017   Procedure: EXCISION OF LEFT BREAST MASS;  Surgeon: Belinda Cough, MD;  Location: [facility];  Service: General;  Laterality: Left;   MULTIPLE TOOTH EXTRACTIONS  06/2023   x5   ORIF ANKLE FRACTURE Right 12/29/2023   Procedure: OPEN REDUCTION INTERNAL FIXATION (ORIF) ANKLE FRACTURE;  Surgeon: Barton Drape, MD;  Location: [facility];  Service: Orthopedics;  Laterality: Right;   ORIF FEMUR FRACTURE Right 06/22/2022   Procedure: RIGHT OPEN REDUCTION INTERNAL FIXATION (ORIF) DISTAL FEMUR FRACTURE;  Surgeon: Celena Sharper, MD;  Location: [facility];   Service: Orthopedics;  Laterality: Right;   patotid cystectomy     RIGHT/LEFT HEART CATH AND CORONARY ANGIOGRAPHY N/A 07/25/2019   Procedure: RIGHT/LEFT HEART CATH AND CORONARY ANGIOGRAPHY;  Surgeon: Cherrie Toribio SAUNDERS, MD;  Location: [facility];  Service: Cardiovascular;  Laterality: N/A;   TENDON REPAIR  1980   left ankle and tibia   TONSILLECTOMY     TOTAL KNEE ARTHROPLASTY  06/21/2011   Procedure: TOTAL KNEE ARTHROPLASTY;  Surgeon: Lerner LULLA Melodi;  Location: [facility];  Service: Orthopedics;  Laterality: Right;   TUBAL LIGATION     Social History   Social History Narrative   Lives at home alone   Right-handed   Drinks 1 or less cups of coffee and 2 servings of either tea or soda per day   Immunization History  Administered Date(s) Administered   Fluad Quad(high Dose 65+) 05/05/2021   INFLUENZA, HIGH DOSE SEASONAL PF 06/12/2018, 05/17/2019, 05/06/2024   Influenza,inj,Quad PF,6+ Mos 05/23/2016, 06/01/2017   Influenza-Unspecified 06/05/2018, 05/17/2019, 06/07/2020, 05/18/2022, 07/06/2023   PFIZER(Purple Top)SARS-COV-2 Vaccination 09/03/2019, 09/24/2019, 04/01/2020, 10/04/2020   PNEUMOCOCCAL CONJUGATE-20 07/30/2023   Pfizer Covid-19 Vaccine Bivalent Booster 36yrs & up 05/05/2021, 05/18/2022   Pfizer(Comirnaty)Fall Seasonal Vaccine 12 years and older 05/06/2024   Pneumococcal Conjugate-13 07/02/2017   Pneumococcal Polysaccharide-23 05/17/2005   Respiratory Syncytial Virus Vaccine,Recomb Aduvanted(Arexvy) 05/18/2022   Td 10/16/2007   Tdap 03/21/2018   Unspecified SARS-COV-2 Vaccination 07/06/2023   Zoster Recombinant(Shingrix ) 06/10/2019, 12/11/2019     Objective: Vital Signs: BP (!) 123/52   Pulse (!) 35   Temp 97.6 F (36.4 C)   Resp 16   Ht 5' 2 (1.575 m)   BMI 38.78 kg/m    Physical Exam Vitals and nursing note reviewed.  Constitutional:      Appearance: She is well-developed.  HENT:     Head: Normocephalic and atraumatic.  Eyes:      Conjunctiva/sclera: Conjunctivae normal.  Cardiovascular:     Rate and Rhythm: Normal rate and regular rhythm.     Heart sounds: Normal heart sounds.  Pulmonary:     Effort: Pulmonary effort is normal.     Breath sounds: Normal breath sounds.  Abdominal:     General: Bowel sounds are normal.  Palpations: Abdomen is soft.  Musculoskeletal:     Cervical back: Normal range of motion.  Lymphadenopathy:     Cervical: No cervical adenopathy.  Skin:    General: Skin is warm and dry.     Capillary Refill: Capillary refill takes less than 2 seconds.  Neurological:     Mental Status: She is alert and oriented to person, place, and time.  Psychiatric:        Behavior: Behavior normal.      Musculoskeletal Exam: Patient remained in a wheelchair during examination. C-spine has limited range of motion.  Thoracolumbar scoliosis noted.  Shoulder joints have good range of motion with some stiffness and discomfort bilaterally.  Elbow joints good range of motion.  Synovial thickening of both wrist joints but no synovitis noted.  Synovial thickening of all MCP joints with tenderness.  PIP and DIP thickening.  Right knee replacement is warm and has limited extension.  No warmth or effusion of the left knee noted.  CDAI Exam: CDAI Score: -- Patient Global: --; Provider Global: -- Swollen: --; Tender: -- Joint Exam 05/13/2024   No joint exam has been documented for this visit   There is currently no information documented on the homunculus. Go to the Rheumatology activity and complete the homunculus joint exam.  Investigation: No additional findings.  Imaging: No results found.   Recent Labs: Lab Results  Component Value Date   WBC 7.6 04/08/2024   HGB 12.2 04/08/2024   PLT 278 04/08/2024   NA 136 05/12/2024   K 3.9 05/12/2024   CL 99 05/12/2024   CO2 26 05/12/2024   GLUCOSE 119 (H) 05/12/2024   BUN 28 (H) 05/12/2024   CREATININE 1.25 (H) 05/12/2024   BILITOT 0.2 03/06/2024    ALKPHOS 99 02/01/2024   AST 16 03/06/2024   ALT 11 03/06/2024   PROT 7.0 03/06/2024   ALBUMIN 4.0 02/01/2024   CALCIUM  9.3 05/12/2024   GFRAA 58 (L) 09/23/2020    Speciality Comments: PLQ Eye Exam: 03/05/2023 [facility] f/u in 1 year  Attempted to contact [facility] regarding the patient's most recent eye exam, left a message for them to fax it to our office.    Procedures:  No procedures performed Allergies: Sulfa antibiotics, Cymbalta  [duloxetine  hcl], Demerol, Ivp dye [iodinated contrast media], Nsaids, and Adhesive [tape]   Assessment / Plan:     Visit Diagnoses: Rheumatoid arthritis involving multiple sites with positive rheumatoid factor (HCC) - Positive RF, positive ANA: Patient presents today experiencing ongoing joint pain and stiffness involving multiple joints.  She has been off of Plaquenil  since April 2025 due to experiencing an infection of the right total knee replacement.  She required irrigation and debridement on 12/12/2023 by Dr. Melodi.  Patient required IV antibiotics and has had to remain on oral antibiotics under the care of infectious disease.  Her sed rate and CRP have remained elevated: ESR was 55 and CRP was 30.2 on 05/13/2024--improving.  Discussed that the elevation of her sed rate and CRP is likely due to the infection. She feels that she has had an exacerbation of symptoms due to rheumatoid arthritis since being off of Plaquenil .  She presents today to discuss resuming Plaquenil  or other treatment options.  Discussed that she is not a good candidate to resume Arava  or any other immunosuppressive agents given history of recurrent serious infections. I spoke with Dr. Dolphus regarding the patients current situation-ok to resume plaquenil .  Discussed that she can resume Plaquenil  200  mg 1 tablet by mouth daily.  She was in agreement.  Recommended updating CBC and CMP 1 month after reinitiating Plaquenil .  She will continue to require  close monitoring.  She will follow-up in the office in 3 months or sooner if needed.  High risk medication use - Plaquenil  200 mg 1 tablet by mouth daily--Plaquenil  has been on hold since April 2025. Cleared by wound care 10/10/23-pressure ulcer healed-no recurrence.  Previously taking Plaquenil  200 mg 1 tablet by mouth daily. Not a good candidate for restarting arava  or any other immunosuppressive agents. BMP updated on 05/12/24.   PLQ Eye Exam: 03/05/2023 [facility] f/u in 1 year.  Plan to call for records updated eye examination.  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  She is using Restasis  2 drops twice daily for dry eyes.  Raynaud's disease without gangrene: No signs of sclerodactyly at this time.   History of total knee replacement, right: Under close care of Dr. Melodi.  Underwent aspiration and synovial analysis on 11/09/2023: positive for E faecalis.  Underwent irrigation and debridement with poly exchange on 12/12/2023 by Dr. Melodi.  Received IV ampicillin  01/24/2024 then switched to p.o. ampicillin  1 g 3 times daily on 01/15/2024.  Patient now remains on amoxicillin  and will be following back up with ID in 1 month.  Currently working with PT and OT. Using a wheelchair primarily but uses a walker if assisted by PT.   She has been off of Plaquenil  since April 2025.   History of fracture of right ankle: Underwent open reduction internal fixation of the right ankle on 12/29/2023 performed by Dr. Barton.  Patient has been nonweightbearing.  She currently remains in a wheelchair but has been able to use a walker with the assistance of PT.  Unilateral primary osteoarthritis, left knee: No warmth or effusion noted.   Degeneration of intervertebral disc of lumbar region without discogenic back pain or lower extremity pain: Chronic pain   Pressure injury of right buttock, stage 4 (HCC) - stage IV right gluteal ulcer in January 2024. going to wound  care for the past 43 weeks.no longer using a wound VAC--discontinued on 06/13/2023.  Pressure injury of deep tissue of right heel: Cleared by wound care on 10/10/2023. No recurrence.   Fibromyalgia: Generalized hyperalgesia and positive tender points on exam.  She is taking Cymbalta  30 mg 1 tablet daily.  Chronic SI joint pain: Chronic pain.  Age-related osteoporosis without current pathological fracture - DEXA 08/27/2019: The BMD measured at Femur Total Right is 0.782 g/cm2 with a T-score of -1.8. s/p Forteo. DEXA scheduled on 04/11/2024.  Status post fracture of femur - Right femur fracture in November 2023 after a fall  Other medical conditions are listed as follows:  History of hyperlipidemia  History of diabetes mellitus  History of cardiac murmur  History of peripheral neuropathy  CKD (chronic kidney disease) stage 4, GFR 15-29 ml/min (HCC)  Eosinophilic esophagitis  History of diverticulitis  History of depression: She is taking Cymbalta  30 mg 1 capsule daily.  Orders: No orders of the defined types were placed in this encounter.  Meds ordered this encounter  Medications   hydroxychloroquine  (PLAQUENIL ) 200 MG tablet    Sig: Take 1 tablet (200 mg total) by mouth daily.    Dispense:  90 tablet    Refill:  0     Follow-Up Instructions: Return in about 3 months (around 08/12/2024) for Rheumatoid arthritis.   Waddell HERO  Cheryl, PA-C  Note - This record has been created using [employer].  Chart creation errors have been sought, but may not always  have been located. Such creation errors do not reflect on  the standard of medical care.

## 2024-05-12 ENCOUNTER — Other Ambulatory Visit: Payer: Self-pay

## 2024-05-12 ENCOUNTER — Encounter: Payer: Self-pay | Admitting: Infectious Diseases

## 2024-05-12 ENCOUNTER — Encounter: Payer: Self-pay | Admitting: Cardiology

## 2024-05-12 ENCOUNTER — Ambulatory Visit: Admitting: Infectious Diseases

## 2024-05-12 VITALS — BP 114/66 | HR 56 | Temp 98.6°F | Resp 16

## 2024-05-12 DIAGNOSIS — T8453XD Infection and inflammatory reaction due to internal right knee prosthesis, subsequent encounter: Secondary | ICD-10-CM | POA: Diagnosis not present

## 2024-05-12 DIAGNOSIS — R609 Edema, unspecified: Secondary | ICD-10-CM

## 2024-05-12 DIAGNOSIS — E7849 Other hyperlipidemia: Secondary | ICD-10-CM

## 2024-05-12 DIAGNOSIS — N189 Chronic kidney disease, unspecified: Secondary | ICD-10-CM | POA: Diagnosis not present

## 2024-05-12 DIAGNOSIS — M0579 Rheumatoid arthritis with rheumatoid factor of multiple sites without organ or systems involvement: Secondary | ICD-10-CM

## 2024-05-12 DIAGNOSIS — Z79899 Other long term (current) drug therapy: Secondary | ICD-10-CM

## 2024-05-12 MED ORDER — FENOFIBRATE 48 MG PO TABS
48.0000 mg | ORAL_TABLET | Freq: Every day | ORAL | 1 refills | Status: AC
Start: 2024-05-12 — End: ?

## 2024-05-12 MED ORDER — ROSUVASTATIN CALCIUM 10 MG PO TABS: 10.0000 mg | ORAL_TABLET | Freq: Every day | ORAL | 1 refills | Status: AC

## 2024-05-12 NOTE — Progress Notes (Unsigned)
 Patient Active Problem List   Diagnosis Date Noted  . Leg swelling 04/08/2024  . Medication management 01/08/2024  . PICC (peripherally inserted central catheter) in place 01/08/2024  . Obesity, Class III, BMI 40-49.9 (morbid obesity) 01/01/2024  . Right displaced trimalleolar ankle fracture with syndesmosis disruption 12/26/2023  . Fall at home, initial encounter 12/26/2023  . History of rheumatoid arthritis 12/26/2023  . Hypokalemia 12/26/2023  . Itching 12/14/2023  . Infection of prosthetic right knee joint 12/12/2023  . IBS (irritable bowel syndrome) 11/02/2023  . IDA (iron  deficiency anemia) 06/05/2023  . Normocytic normochromic anemia 06/05/2023  . Normocytic anemia 06/01/2023  . Situational anxiety 10/15/2022  . Hx of Pressure sore of left ischial area, stage IV- healed as of February 2025 09/10/2022  . DNR (do not resuscitate) 06/21/2022  . Agatston coronary artery calcium  score greater than 400 05/25/2022  . Laryngopharyngeal reflux (LPR) 10/10/2021  . Chronic maxillary sinusitis 10/10/2021  . Prediabetes 02/14/2021  . Chronic kidney disease, stage 3b (HCC) 08/01/2020  . Post-menopausal 09/02/2019  . Essential hypertension 08/20/2018  . Recurrent urticaria 05/20/2018  . Vitamin D  deficiency 11/20/2017  . Hyperlipidemia 11/20/2017  . Raynaud's disease without gangrene 03/12/2017  . Eosinophilic esophagitis 10/12/2016  . 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 History of IBS 09/03/2016  . Autoimmune disease 09/02/2016  . Abnormality of gait 05/09/2016  . Sjogren syndrome 02/29/2016  . GERD (gastroesophageal reflux disease) 01/19/2016  . Glucose intolerance (impaired glucose tolerance) 01/19/2016  . Chronic diastolic CHF (congestive heart failure) (HCC) 04/20/2015  . Peripheral neuropathy 02/02/2014  . Class 2 obesity due to excess calories with body mass index (BMI) of 36.0 to 36.9 in adult 11/17/2013  . Heart murmur   . OSA (obstructive sleep apnea)   .  Rheumatoid arthritis (HCC)   . Hiatal hernia   . Fibromyalgia   . History of PVC (premature ventricular contraction)   . Unilateral primary osteoarthritis, left knee 06/22/2011    Patient's Medications  New Prescriptions   No medications on file  Previous Medications   AMOXICILLIN  (AMOXIL ) 500 MG CAPSULE    Take 2 capsules (1,000 mg total) by mouth 3 (three) times daily.   ASPIRIN  EC 81 MG TABLET    Take 1 tablet (81 mg total) by mouth daily. Swallow whole.   CETIRIZINE  (ZYRTEC ) 10 MG TABLET    TAKE 1 TABLET (10 MG TOTAL) BY MOUTH DAILY.   COLESTIPOL  (COLESTID ) 1 G TABLET    Take 1-2 g by mouth daily as needed (ibs flare up).   CYCLOSPORINE  (RESTASIS ) 0.05 % OPHTHALMIC EMULSION    Place 2 drops into both eyes 2 (two) times daily as needed (dry eyes).   DULOXETINE  (CYMBALTA ) 30 MG CAPSULE    Take 1 capsule (30 mg total) by mouth daily.   FENOFIBRATE  (TRICOR ) 48 MG TABLET    Take 1 tablet (48 mg total) by mouth daily.   FERROUS SULFATE  325 (65 FE) MG TABLET    Take 1 tablet (325 mg total) by mouth every other day.   FOLIC ACID  (FOLVITE ) 1 MG TABLET    Take 1 tablet (1 mg total) by mouth daily.   GABAPENTIN  (NEURONTIN ) 300 MG CAPSULE    TAKE 2 CAPSULES BY MOUTH 2 TIMES DAILY.   HYDROXYCHLOROQUINE  (PLAQUENIL ) 200 MG TABLET    TAKE 1 TABLET (200 MG TOTAL) BY MOUTH DAILY.   HYOSCYAMINE  (ANASPAZ ) 0.125 MG TBDP DISINTERGRATING TABLET    Place 0.125 mg under the tongue every  6 (six) hours as needed for cramping.   LORAZEPAM  (ATIVAN ) 0.5 MG TABLET    Take 1 tablet (0.5 mg total) by mouth at bedtime as needed for sleep. At bedtime prn   METHOCARBAMOL  (ROBAXIN ) 500 MG TABLET    Take 1 tablet (500 mg total) by mouth every 6 (six) hours as needed for muscle spasms.   METOPROLOL  SUCCINATE (TOPROL -XL) 25 MG 24 HR TABLET    TAKE 1/2 TABLET BY MOUTH (12.5 MG TOTAL) DAILY   MULTIPLE VITAMIN (MULTIVITAMIN WITH MINERALS) TABS TABLET    Take 1 tablet by mouth daily.   NONFORMULARY OR COMPOUNDED ITEM    Apply 1  application  topically daily as needed (irritation). Triamcinolone  0.1% & Silvadene cream 1:1- Apply as directed to affected areas as needed   NYSTATIN  CREAM (MYCOSTATIN )    Apply 1 Application topically daily.   OMEPRAZOLE (PRILOSEC) 40 MG CAPSULE    Take 40 mg by mouth daily as needed (acid reflux).   ONDANSETRON  (ZOFRAN ) 4 MG TABLET    Take 1 tablet (4 mg total) by mouth every 6 (six) hours as needed for nausea.   OXYCODONE  (OXY IR/ROXICODONE ) 5 MG IMMEDIATE RELEASE TABLET    Take 1 tablet (5 mg total) by mouth every 6 (six) hours as needed for up to 5 days for severe pain (pain score 7-10) or breakthrough pain.   POLYETHYLENE GLYCOL (MIRALAX ) 17 G PACKET    Take 17 g by mouth daily as needed for mild constipation.   POTASSIUM CHLORIDE  (KLOR-CON ) 10 MEQ TABLET    Take 2 tablets (20 mEq total) by mouth daily.   ROSUVASTATIN  (CRESTOR ) 10 MG TABLET    Take 1 tablet (10 mg total) by mouth daily. Please keep scheduled appointment for future refills. Thank you.   SPIRONOLACTONE  (ALDACTONE ) 25 MG TABLET    TAKE 1 TABLET (25 MG TOTAL) BY MOUTH DAILY.   TORSEMIDE  40 MG TABS    Take 40 mg by mouth daily.  Modified Medications   No medications on file  Discontinued Medications   No medications on file    Subjective: 73 year old female with prior history of basal cell carcinoma, CHF, CKD, DDD, GERD, Sjogren's syndrome, RA not on tx, Raynaud's phenomenon, peripheral neuropathy, fibromyalgia, HTN, HLD, obesity/OSA, OA, prediabetes, right knee arthroplasty, prolonged healing of sacral decub infection (managed with initial antibiotics and wound care) admitted who is here for HFU ( 4/30-5/3) for rt knee PJI.   She got re-admitted 5/14-5/21 after a fall leading to rt ankle #. She underwent ORIF on 5/17. She was seen by Ortho for her rt knee while admitted and was told her wound healing as expected.   5/27 Accompanied by her daughter. She comes from SNF. Receiving IV ampicillin  through PICC. MAR reviewed, not  on plaquenil  or any treatment for RA. She reports some hot flushes and need to pee a lot while on antibiotics. Denies urgency or burning of urine. Denies fevers and chills. Denies nausea, vomiting and diarrhea. Rt knee wound has healed. Has a fu with Ortho for rt ankle Thursday and for Rt knee with Dr Hiram in June. No other concerns.   6/9 Accompanied by daughter. Getting IV ampicillin  through PICC without any concerns like nausea, vomiting, diarrhea. Reports mild redness at PICC insertion site but no other symptoms concerning of infection like warmth, redness, swelling and tenderness. Rt knee wound has healed. She has not seen Dr Hiram after last visit. She has seen Ortho for rft ankle. Labs drawn from 6/9  and last Monday which has been requested from SNF.   7/24 Completed IV ampicillin  on 6/12 after which PICC removed and started taking Amoxicillin  1g po tid without missing doses or any concerns. Last seen by JONETTA Mountain on 7/3 and next appt is tomorrow and will decide if she can be WB. Seen by Ortho for rt knee on 7/8, Dr Hiram on 6/17.  Seen by Hematology on 6/20 for anemia. Next fu with Dr Hiram is in Aug. No concerns for infection in rt hip and ankle.  She has a fu with Rheum up coming. No complaints.    8/26 Compliant with PO amoxicillin . Currently at independent living. Seen by Dr Hiram 8/5. However, last Friday developed sharp pain in her left knee all of a sudden, some increased swelling/soreness. She is also doing PT and rt knee hurts when moves around and walks, no concerns in rt ankle. No pain in rt knee when sits still. Rt leg is also swollen. Denies fevers, chills. Denies nausea, vomiting, diarrhea. Denies malaise.   9/29 She has an appt with Rheum tomorrow, needs lab work for nephrology and requesting, needs ride for lab. Lives in independent living. On diuretics and has helped with b/l leg swelling. Compliant with amox and can keep on 1 year if needed, no issues.   Review of  Systems: all systems reviewed with pertinent positives and negatives as listed above   Past Medical History:  Diagnosis Date  . Agatston coronary artery calcium  score greater than 400 05/2022   coronary Ca score 856  . Anemia   . Back pain   . Blood transfusion    1981  . Cancer (HCC)    basal cell carcinoma, x 3   on leg and bottom of spine  . Chronic diastolic CHF (congestive heart failure) (HCC)    diastolic   . Chronic kidney disease    CKD3a  . COVID   . DDD (degenerative disc disease), cervical   . DDD (degenerative disc disease), lumbar   . Family history of coronary arteriosclerosis- strong fam h/o CAD and early CAD.  07/17/2017  . Fibromyalgia   . GERD (gastroesophageal reflux disease)   . Heart murmur    as a child  . Hiatal hernia    sjorgens syndrome  . High blood pressure   . High cholesterol   . IBS (irritable bowel syndrome)   . OSA (obstructive sleep apnea)   . Osteoarthritis   . Peripheral autonomic neuropathy of unknown cause   . Pneumonia   . Pre-diabetes   . PVC (premature ventricular contraction)   . Raynaud disease   . Rheumatoid arthritis (HCC)   . Sjogren's disease   . Urticaria   . Vitamin D  deficiency    Past Surgical History:  Procedure Laterality Date  . ABDOMINAL HYSTERECTOMY     BTL, BSO  . ADENOIDECTOMY    . BREAST EXCISIONAL BIOPSY Left   . BREAST SURGERY     mass removal   . CHOLECYSTECTOMY    . dental implants    . DIAGNOSTIC LAPAROSCOPY     x3  . FEMUR FRACTURE SURGERY Right   . I & D KNEE WITH POLY EXCHANGE Right 12/12/2023   Procedure: IRRIGATION AND DEBRIDEMENT KNEE WITH POLY EXCHANGE;  Surgeon: Melodi Lerner, MD;  Location: [facility];  Service: Orthopedics;  Laterality: Right;  . KNEE ARTHROSCOPY     x2  . MASS EXCISION Left 03/22/2017   Procedure: EXCISION OF LEFT BREAST MASS;  Surgeon: Belinda Cough, MD;  Location: [facility];  Service: General;  Laterality: Left;  SABRA MULTIPLE TOOTH EXTRACTIONS  06/2023   x5  . ORIF ANKLE  FRACTURE Right 12/29/2023   Procedure: OPEN REDUCTION INTERNAL FIXATION (ORIF) ANKLE FRACTURE;  Surgeon: Barton Drape, MD;  Location: [facility];  Service: Orthopedics;  Laterality: Right;  . ORIF FEMUR FRACTURE Right 06/22/2022   Procedure: RIGHT OPEN REDUCTION INTERNAL FIXATION (ORIF) DISTAL FEMUR FRACTURE;  Surgeon: Celena Sharper, MD;  Location: [facility];  Service: Orthopedics;  Laterality: Right;  . patotid cystectomy    . RIGHT/LEFT HEART CATH AND CORONARY ANGIOGRAPHY N/A 07/25/2019   Procedure: RIGHT/LEFT HEART CATH AND CORONARY ANGIOGRAPHY;  Surgeon: Cherrie Toribio SAUNDERS, MD;  Location: [facility];  Service: Cardiovascular;  Laterality: N/A;  . TENDON REPAIR  1980   left ankle and tibia  . TONSILLECTOMY    . TOTAL KNEE ARTHROPLASTY  06/21/2011   Procedure: TOTAL KNEE ARTHROPLASTY;  Surgeon: Dempsey LULLA Moan;  Location: [facility];  Service: Orthopedics;  Laterality: Right;  . TUBAL LIGATION      Social History   Tobacco Use  . Smoking status: Never    Passive exposure: Past  . Smokeless tobacco: Never  Vaping Use  . Vaping status: Never Used  Substance Use Topics  . Alcohol  use: Yes    Comment: occ  . Drug use: No    Family History  Problem Relation Age of Onset  . Alzheimer's disease Mother   . Heart attack Mother   . Hypertension Mother   . Glaucoma Mother   . High Cholesterol Mother   . Heart disease Mother   . Depression Mother   . Cancer Father   . High Cholesterol Father   . Heart disease Father   . Sleep apnea Father   . Breast cancer Maternal Aunt   . Heart attack Brother   . Heart disease Brother   . Glaucoma Brother   . Hyperlipidemia Brother   . Glaucoma Brother   . Asthma Son   . Allergic rhinitis Neg Hx   . Angioedema Neg Hx   . Eczema Neg Hx   . Urticaria Neg Hx   . Neuropathy Neg Hx     Allergies  Allergen Reactions  . Sulfa Antibiotics Hives and Itching    Flushing  . Cymbalta  [Duloxetine  Hcl] Other (See Comments)    Manic reaction   . Demerol Hives and Other (See Comments)    Fever   . Ivp Dye [Iodinated Contrast Media] Hives and Other (See Comments)    Fever  . Nsaids Other (See Comments)    CKD stage 3b - GFR 41  . Adhesive [Tape] Rash    Health Maintenance  Topic Date Due  . Colonoscopy  11/21/2022  . Medicare Annual Wellness (AWV)  12/28/2023  . Influenza Vaccine  03/14/2024  . COVID-19 Vaccine (8 - Pfizer risk 2024-25 season) 04/14/2024  . Mammogram  11/05/2025  . DTaP/Tdap/Td (3 - Td or Tdap) 03/21/2028  . Pneumococcal Vaccine: 50+ Years  Completed  . DEXA SCAN  Completed  . Hepatitis C Screening  Completed  . Zoster Vaccines- Shingrix   Completed  . HPV VACCINES  Aged Out  . Meningococcal B Vaccine  Aged Out    Objective: There were no vitals taken for this visit.'  Physical Exam Constitutional:      Appearance: Normal appearance.  HENT:     Head: Normocephalic and atraumatic.      Mouth: Mucous membranes  are moist.  Eyes:    Conjunctiva/sclera: Conjunctivae normal.     Pupils: Pupils are equal, round, and b/l symmetrical    Cardiovascular:     Rate and Rhythm: Normal rate and regular rhythm.     Heart sounds:   Pulmonary:     Effort: Pulmonary effort is normal.     Breath sounds:   Abdominal:     General: Non distended     Palpations:   Musculoskeletal:        General: sitting in the wheelchair, Rt foot and ankle wrapped in a  bandage. Rt knee surgical site has healed. RT knee with some swelling, no significant warmth, mild tenderness, no erythema. ROM is good. RT leg swollen than left. No cellulitis in rt lower extremity  Skin:    General: Skin is warm and dry.     Comments: no rashes   Neurological:     General: grossly non focal     Mental Status: awake, alert and oriented to person, place, and time.   Psychiatric:        Mood and Affect: Mood normal.   Lab Results Lab Results  Component Value Date   WBC 7.6 04/08/2024   HGB 12.2 04/08/2024   HCT 36.9 04/08/2024    MCV 88.7 04/08/2024   PLT 278 04/08/2024    Lab Results  Component Value Date   CREATININE 1.60 (H) 04/08/2024   BUN 25 04/08/2024   NA 138 04/08/2024   K 3.8 04/08/2024   CL 99 04/08/2024   CO2 29 04/08/2024    Lab Results  Component Value Date   ALT 11 03/06/2024   AST 16 03/06/2024   ALKPHOS 99 02/01/2024   BILITOT 0.2 03/06/2024    Lab Results  Component Value Date   CHOL 120 10/23/2023   HDL 60 10/23/2023   LDLCALC 38 10/23/2023   TRIG 125 10/23/2023   CHOLHDL 2.0 10/23/2023   No results found for: LABRPR, RPRTITER No results found for: HIV1RNAQUANT, HIV1RNAVL, CD4TABS   Assessment/Plan # Right knee PJI - 3/28 aspiration in Ortho clinic with E faecalis - 4/30 I&D with liner exchange.  Or culture with E faecalis - No antibiotics prior to I&D - s/ 6 weeks of IV ampicillin  through 6/12,then switch PO amoxicillin  1g po tid starting 6/13 for 6 months total   # Rt knee/Leg swelling  - does not seem septic arthritis clinically  - CBC, BMP, ESR and CRP today  - US  of rt leg to r/o DVT - Discussed to fu  with Ortho for +/- aspiration  - Continue PO amoxicillin  1g po tid as is  - Discussed signs and symptoms to seek immediate attention like increased swelling, redness, significant warmth, fevers and chills.  - fu pending labs and imaging   # Rt ankle #  - s/p ORIF on 5/17  - no concerns here - fu with Dr Barton, last seen 8/21  # RA  - not on any treatment per patient report - Fu with Rheumatology   I spent 25 minutes involved in face-to-face and non-face-to-face activities for this patient on the day of the visit. Professional time spent includes the following activities: Preparing to see the patient (review of tests), Reviewing prior notes from Dr Barton 8/21, Dr Hiram 8/5,  Performing a medically appropriate examination and evaluation, Ordering labs/Imaging,  Documenting clinical information in the EMR, Independently interpreting results (not  separately reported), Communicating results to the patient, Counseling and educating the patient.  Of note, portions of this note may have been created with voice recognition software. While this note has been edited for accuracy, occasional wrong-word or 'sound-a-like' substitutions may have occurred due to the inherent limitations of voice recognition software.   Annalee Joseph, MD [facility] 05/12/2024, 1:40 PM

## 2024-05-13 ENCOUNTER — Ambulatory Visit: Attending: Physician Assistant | Admitting: Physician Assistant

## 2024-05-13 ENCOUNTER — Encounter: Payer: Self-pay | Admitting: Physician Assistant

## 2024-05-13 ENCOUNTER — Ambulatory Visit: Payer: Self-pay | Admitting: Infectious Diseases

## 2024-05-13 VITALS — BP 123/52 | HR 35 | Temp 97.6°F | Resp 16 | Ht 62.0 in

## 2024-05-13 DIAGNOSIS — M51369 Other intervertebral disc degeneration, lumbar region without mention of lumbar back pain or lower extremity pain: Secondary | ICD-10-CM | POA: Diagnosis not present

## 2024-05-13 DIAGNOSIS — Z8639 Personal history of other endocrine, nutritional and metabolic disease: Secondary | ICD-10-CM

## 2024-05-13 DIAGNOSIS — M533 Sacrococcygeal disorders, not elsewhere classified: Secondary | ICD-10-CM

## 2024-05-13 DIAGNOSIS — M1712 Unilateral primary osteoarthritis, left knee: Secondary | ICD-10-CM

## 2024-05-13 DIAGNOSIS — Z8781 Personal history of (healed) traumatic fracture: Secondary | ICD-10-CM

## 2024-05-13 DIAGNOSIS — M797 Fibromyalgia: Secondary | ICD-10-CM | POA: Diagnosis not present

## 2024-05-13 DIAGNOSIS — G8929 Other chronic pain: Secondary | ICD-10-CM

## 2024-05-13 DIAGNOSIS — L89616 Pressure-induced deep tissue damage of right heel: Secondary | ICD-10-CM | POA: Diagnosis not present

## 2024-05-13 DIAGNOSIS — Z96651 Presence of right artificial knee joint: Secondary | ICD-10-CM

## 2024-05-13 DIAGNOSIS — Z8669 Personal history of other diseases of the nervous system and sense organs: Secondary | ICD-10-CM

## 2024-05-13 DIAGNOSIS — Z8659 Personal history of other mental and behavioral disorders: Secondary | ICD-10-CM

## 2024-05-13 DIAGNOSIS — Z8679 Personal history of other diseases of the circulatory system: Secondary | ICD-10-CM

## 2024-05-13 DIAGNOSIS — I7300 Raynaud's syndrome without gangrene: Secondary | ICD-10-CM

## 2024-05-13 DIAGNOSIS — M810 Age-related osteoporosis without current pathological fracture: Secondary | ICD-10-CM

## 2024-05-13 DIAGNOSIS — M0579 Rheumatoid arthritis with rheumatoid factor of multiple sites without organ or systems involvement: Secondary | ICD-10-CM

## 2024-05-13 DIAGNOSIS — L89314 Pressure ulcer of right buttock, stage 4: Secondary | ICD-10-CM | POA: Diagnosis not present

## 2024-05-13 DIAGNOSIS — M3500 Sicca syndrome, unspecified: Secondary | ICD-10-CM | POA: Diagnosis not present

## 2024-05-13 DIAGNOSIS — Z8719 Personal history of other diseases of the digestive system: Secondary | ICD-10-CM

## 2024-05-13 DIAGNOSIS — Z79899 Other long term (current) drug therapy: Secondary | ICD-10-CM

## 2024-05-13 DIAGNOSIS — K200 Eosinophilic esophagitis: Secondary | ICD-10-CM

## 2024-05-13 DIAGNOSIS — N184 Chronic kidney disease, stage 4 (severe): Secondary | ICD-10-CM

## 2024-05-13 LAB — BASIC METABOLIC PANEL WITH GFR
BUN/Creatinine Ratio: 22 (calc) (ref 6–22)
BUN: 28 mg/dL — ABNORMAL HIGH (ref 7–25)
CO2: 26 mmol/L (ref 20–32)
Calcium: 9.3 mg/dL (ref 8.6–10.4)
Chloride: 99 mmol/L (ref 98–110)
Creat: 1.25 mg/dL — ABNORMAL HIGH (ref 0.60–1.00)
Glucose, Bld: 119 mg/dL — ABNORMAL HIGH (ref 65–99)
Potassium: 3.9 mmol/L (ref 3.5–5.3)
Sodium: 136 mmol/L (ref 135–146)
eGFR: 46 mL/min/1.73m2 — ABNORMAL LOW (ref >=60)

## 2024-05-13 LAB — SEDIMENTATION RATE: Sed Rate: 55 mm/h — ABNORMAL HIGH (ref 0–30)

## 2024-05-13 LAB — C-REACTIVE PROTEIN: CRP: 30.2 mg/L — ABNORMAL HIGH (ref <8.0)

## 2024-05-13 MED ORDER — HYDROXYCHLOROQUINE SULFATE 200 MG PO TABS: 200.0000 mg | ORAL_TABLET | Freq: Every day | ORAL | 0 refills | Status: AC

## 2024-05-13 NOTE — Patient Instructions (Signed)
 Standing Labs We placed an order today for your standing lab work.   Please have your standing labs drawn in 1 month   Please have your labs drawn 2 weeks prior to your appointment so that the provider can discuss your lab results at your appointment, if possible.  Please note that you may see your imaging and lab results in MyChart before we have reviewed them. We will contact you once all results are reviewed. Please allow our office up to 72 hours to thoroughly review all of the results before contacting the office for clarification of your results.  WALK-IN LAB HOURS  Monday through Thursday from 8:00 am -12:30 pm and 1:00 pm-4:30 pm and Friday from 8:00 am-12:00 pm.  Patients with office visits requiring labs will be seen before walk-in labs.  You may encounter longer than normal wait times. Please allow additional time. Wait times may be shorter on  Monday and Thursday afternoons.  We do not book appointments for walk-in labs. We appreciate your patience and understanding with our staff.   Labs are drawn by [facility]. Please bring your co-pay at the time of your lab draw.  You may receive a bill from [facility] for your lab work.  Please note if you are on Hydroxychloroquine  and and an order has been placed for a Hydroxychloroquine  level,  you will need to have it drawn 4 hours or more after your last dose.  If you wish to have your labs drawn at another location, please call the office 24 hours in advance so we can fax the orders.  The office is located at [address], [place], [place] [address]   If you have any questions regarding directions or hours of operation,  please call [phone].   As a reminder, please drink plenty of water prior to coming for your lab work. Thanks!

## 2024-05-19 ENCOUNTER — Encounter (INDEPENDENT_AMBULATORY_CARE_PROVIDER_SITE_OTHER): Payer: Self-pay

## 2024-05-19 ENCOUNTER — Telehealth: Payer: Self-pay

## 2024-05-19 NOTE — Telephone Encounter (Signed)
 Copied from CRM #[id number]. Topic: Clinical - Home Health Verbal Orders >> May 19, 2024 12:44 PM Ivette P wrote: Caller/Agency: Melissa _ [facility]  Callback Number: [phone] - secured line  Service Requested: Occupational Therapy Frequency: 1 week 1 time  Any new concerns about the patient? No, doing very well

## 2024-05-19 NOTE — Progress Notes (Unsigned)
 PATIENT: [name] DOB: Jan 02, 1951  REASON FOR VISIT: follow up HISTORY FROM: patient  Virtual Visit via Video Note  I connected with [name] on 05/20/24 at  2:30 PM EDT by a video enabled telemedicine application located remotely at [facility] and verified that I am speaking with the correct person using two identifiers who was located at their own home in [place]   I discussed the limitations of evaluation and management by telemedicine and the availability of in person appointments. The patient expressed understanding and agreed to proceed.   PATIENT: [name] DOB: [birth year]  REASON FOR VISIT: follow up HISTORY FROM: patient  HISTORY OF PRESENT ILLNESS: Today 05/20/24  [name] is a 73 y.o. female with a history of peripheral neuropathy. Returns today for follow-up.  At the last visit she saw Dr. Onita who started her on Cymbalta  30 mg daily. She reports that this has been beneficial. Less pain with the addition. Remains on gabapentin  600 mg BID. She would rather stay on the current dose of cymbalta . In the past when she was on higher dose it made her manic. Still wheelchair bound after knee surgery and infection. Currently in an independent living facility.   HISTORY (copied from Dr. Onita): [name], is a 73 year old female Is here to follow-up for her peripheral neuropathy, she was seen by Dr. Jenel since 2015, later by nurse practitioner Matraca Hunkins     History is obtained from the patient and review of electronic medical records. I personally reviewed pertinent available imaging films in PACS.    PMHx of  PCVs Chronic knee, ankle pain Chronic kidney disease Neuropathy HLD Obesity CAD Rheumatoid Arthritis, off plaquenil  since right ankle surgery  Right femur fracture in 2023, had surgery Sacral decubitus ulcer required wound center Jan 2024- Feb 2025,   Since 1999, she began to have slow onset bilateral feet paresthesia,  progressively worse, EMG nerve conduction study [facility] 2011 confirmed peripheral neuropathy,   She does have Sjogren's disease, rheumatoid factor, diabetes, which was under reasonable control   Over the years, she complains of neuropathic pain, tried different medications including Lyrica , Cymbalta , Topamax , gabapentin ,   Unfortunately, she had a lot of complicated medical condition over the past couple years,   Right femur fracture in November 2023, required surgery, put her into wheelchair,   Later also developed right knee infection, required prolonged antibiotic treatment, redo of right knee replacement in April 2025, also complicated by prolonged immobility, decubitus ulcer, required wound care,   Right ankle fracture in May 2025,   She is still in wheelchair, only started to have partial weightbearing, currently taking gabapentin  300 mg 2 tablets twice a day, oxycodone  as needed,   She complains of worsening neuropathic pain, numbness tingling in her leg, fingertips paresthesia,  REVIEW OF SYSTEMS: Out of a complete 14 system review of symptoms, the patient complains only of the following symptoms, and all other reviewed systems are negative.  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 180 capsule 2   aspirin  EC 81 MG tablet Take 1 tablet (81 mg total) by mouth daily. Swallow whole. 30 tablet 12   cetirizine  (ZYRTEC ) 10 MG tablet TAKE 1 TABLET (10 MG TOTAL) BY MOUTH  DAILY. 90 tablet 1   colestipol  (COLESTID ) 1 g tablet Take 1-2 g by mouth daily as needed (ibs flare up).     cycloSPORINE  (RESTASIS ) 0.05 % ophthalmic emulsion Place 2 drops into both eyes 2 (two) times daily as needed (dry eyes).     diphenoxylate -atropine  (LOMOTIL ) 2.5-0.025 MG tablet as needed.     DULoxetine  (CYMBALTA ) 30 MG capsule Take 1 capsule (30 mg total) by mouth daily. 30 capsule 11   fenofibrate  (TRICOR ) 48 MG tablet Take 1 tablet (48 mg total) by mouth daily. 90 tablet 1   ferrous sulfate  325 (65 FE) MG tablet Take 1 tablet (325 mg total) by mouth every other day. (Patient not taking: Reported on 05/13/2024) 14 tablet 0   folic acid  (FOLVITE ) 1 MG tablet Take 1 tablet (1 mg total) by mouth daily. (Patient not taking: Reported on 05/13/2024) 30 tablet 5   hydroxychloroquine  (PLAQUENIL ) 200 MG tablet Take 1 tablet (200 mg total) by mouth daily. 90 tablet 0   hyoscyamine  (ANASPAZ ) 0.125 MG TBDP disintergrating tablet Place 0.125 mg under the tongue every 6 (six) hours as needed for cramping.     ibuprofen  (ADVIL ) 800 MG tablet as needed.     LORazepam  (ATIVAN ) 0.5 MG tablet Take 1 tablet (0.5 mg total) by mouth at bedtime as needed for sleep. At bedtime prn 3 tablet 0   methocarbamol  (ROBAXIN ) 500 MG tablet Take 1 tablet (500 mg total) by mouth every 6 (six) hours as needed for muscle spasms. 40 tablet 0   metoprolol  succinate (TOPROL -XL) 25 MG 24 hr tablet TAKE 1/2 TABLET BY MOUTH (12.5 MG TOTAL) DAILY 45 tablet 3   Multiple Vitamin (MULTIVITAMIN WITH MINERALS) TABS tablet Take 1 tablet by mouth daily.     NONFORMULARY OR COMPOUNDED ITEM Apply 1 application  topically daily as needed (irritation). Triamcinolone  0.1% & Silvadene cream 1:1- Apply as directed to affected areas as needed     nystatin  cream (MYCOSTATIN ) Apply 1 Application topically daily. (Patient taking differently: Apply 1 Application topically as needed.)     omeprazole (PRILOSEC) 40 MG capsule Take 40 mg by mouth daily as  needed (acid reflux).     ondansetron  (ZOFRAN ) 4 MG tablet Take 1 tablet (4 mg total) by mouth every 6 (six) hours as needed for nausea. (Patient not taking: Reported on 05/13/2024) 20 tablet 0   oxyCODONE  (OXY IR/ROXICODONE ) 5 MG immediate release tablet Take 1 tablet (5 mg total) by mouth every 6 (six) hours as needed for up to 5 days for severe pain (pain score 7-10) or breakthrough pain. (Patient not taking: Reported on 05/13/2024)     polyethylene glycol (MIRALAX ) 17 g packet Take 17 g by mouth daily as needed for mild constipation.     potassium chloride  (KLOR-CON ) 10 MEQ tablet Take 2 tablets (20 mEq total) by mouth daily. 180 tablet 3   promethazine  (PHENERGAN ) 25 MG tablet as needed.     rosuvastatin  (CRESTOR ) 10 MG tablet Take 1 tablet (10 mg total) by mouth daily. 90 tablet 1   spironolactone  (ALDACTONE ) 25 MG tablet TAKE 1 TABLET (25 MG TOTAL)  BY MOUTH DAILY. 90 tablet 0   torsemide  40 MG TABS Take 40 mg by mouth daily. 30 tablet 0   gabapentin  (NEURONTIN ) 300 MG capsule TAKE 2 CAPSULES BY MOUTH 2 TIMES DAILY. 360 capsule 1   No facility-administered medications prior to visit.    PAST MEDICAL HISTORY: Past Medical History:  Diagnosis Date   Agatston coronary artery calcium  score greater than 400 05/2022   coronary Ca score 856   Anemia    Back pain    Blood transfusion    1981   Cancer (HCC)    basal cell carcinoma, x 3   on leg and bottom of spine   Chronic diastolic CHF (congestive heart failure) (HCC)    diastolic    Chronic kidney disease    CKD3a   COVID    DDD (degenerative disc disease), cervical    DDD (degenerative disc disease), lumbar    Family history of coronary arteriosclerosis- strong fam h/o CAD and early CAD.  07/17/2017   Fibromyalgia    GERD (gastroesophageal reflux disease)    Heart murmur    as a child   Hiatal hernia    sjorgens syndrome   High blood pressure    High cholesterol    IBS (irritable bowel syndrome)    OSA (obstructive sleep  apnea)    Osteoarthritis    Peripheral autonomic neuropathy of unknown cause    Pneumonia    Pre-diabetes    PVC (premature ventricular contraction)    Raynaud disease    Rheumatoid arthritis (HCC)    Sjogren's disease    Urticaria    Vitamin D  deficiency     PAST SURGICAL HISTORY: Past Surgical History:  Procedure Laterality Date   ABDOMINAL HYSTERECTOMY     BTL, BSO   ADENOIDECTOMY     BREAST EXCISIONAL BIOPSY Left    BREAST SURGERY     mass removal    CHOLECYSTECTOMY     dental implants     DIAGNOSTIC LAPAROSCOPY     x3   FEMUR FRACTURE SURGERY Right    I & D KNEE WITH POLY EXCHANGE Right 12/12/2023   Procedure: IRRIGATION AND DEBRIDEMENT KNEE WITH POLY EXCHANGE;  Surgeon: Melodi Lerner, MD;  Location: [facility];  Service: Orthopedics;  Laterality: Right;   KNEE ARTHROSCOPY     x2   MASS EXCISION Left 03/22/2017   Procedure: EXCISION OF LEFT BREAST MASS;  Surgeon: Belinda Cough, MD;  Location: [facility];  Service: General;  Laterality: Left;   MULTIPLE TOOTH EXTRACTIONS  06/2023   x5   ORIF ANKLE FRACTURE Right 12/29/2023   Procedure: OPEN REDUCTION INTERNAL FIXATION (ORIF) ANKLE FRACTURE;  Surgeon: Barton Drape, MD;  Location: [facility];  Service: Orthopedics;  Laterality: Right;   ORIF FEMUR FRACTURE Right 06/22/2022   Procedure: RIGHT OPEN REDUCTION INTERNAL FIXATION (ORIF) DISTAL FEMUR FRACTURE;  Surgeon: Celena Sharper, MD;  Location: [facility];  Service: Orthopedics;  Laterality: Right;   patotid cystectomy     RIGHT/LEFT HEART CATH AND CORONARY ANGIOGRAPHY N/A 07/25/2019   Procedure: RIGHT/LEFT HEART CATH AND CORONARY ANGIOGRAPHY;  Surgeon: Cherrie Toribio SAUNDERS, MD;  Location: [facility];  Service: Cardiovascular;  Laterality: N/A;   TENDON REPAIR  1980   left ankle and tibia   TONSILLECTOMY     TOTAL KNEE ARTHROPLASTY  06/21/2011   Procedure: TOTAL KNEE ARTHROPLASTY;  Surgeon: Lerner LULLA Melodi;  Location: [facility];  Service: Orthopedics;  Laterality: Right;    TUBAL LIGATION  FAMILY HISTORY: Family History  Problem Relation Age of Onset   Alzheimer's disease Mother    Heart attack Mother    Hypertension Mother    Glaucoma Mother    High Cholesterol Mother    Heart disease Mother    Depression Mother    Cancer Father    High Cholesterol Father    Heart disease Father    Sleep apnea Father    Heart attack Brother    Heart disease Brother    Glaucoma Brother    Hyperlipidemia Brother    Glaucoma Brother    Breast cancer Maternal Aunt    Asthma Son    Allergic rhinitis Neg Hx    Angioedema Neg Hx    Eczema Neg Hx    Urticaria Neg Hx    Neuropathy Neg Hx     SOCIAL HISTORY: Social History   Socioeconomic History   Marital status: Widowed    Spouse name: Not on file   Number of children: 2   Years of education: AS   Highest education level: Bachelor's degree (e.g., BA, AB, BS)  Occupational History   Occupation: retired Engineer, civil (consulting)   Occupation: RN  Tobacco Use   Smoking status: Never    Passive exposure: Past   Smokeless tobacco: Never  Vaping Use   Vaping status: Never Used  Substance and Sexual Activity   Alcohol  use: Yes    Comment: occ   Drug use: No   Sexual activity: Never  Other Topics Concern   Not on file  Social History Narrative   Lives at home alone   Right-handed   Drinks 1 or less cups of coffee and 2 servings of either tea or soda per day   Social Drivers of Health   Financial Resource Strain: Low Risk  (07/29/2023)   Overall Financial Resource Strain (CARDIA)    Difficulty of Paying Living Expenses: Not hard at all  Food Insecurity: No Food Insecurity (12/26/2023)   Hunger Vital Sign    Worried About Running Out of Food in the Last Year: Never true    Ran Out of Food in the Last Year: Never true  Transportation Needs: No Transportation Needs (12/26/2023)   PRAPARE - Administrator, Civil Service (Medical): No    Lack of Transportation (Non-Medical): No  Physical Activity: Inactive  (07/29/2023)   Exercise Vital Sign    Days of Exercise per Week: 0 days    Minutes of Exercise per Session: 30 min  Stress: No Stress Concern Present (07/29/2023)   [employer] of Occupational Health - Occupational Stress Questionnaire    Feeling of Stress : Only a little  Social Connections: Moderately Integrated (12/26/2023)   Social Connection and Isolation Panel    Frequency of Communication with Friends and Family: More than three times a week    Frequency of Social Gatherings with Friends and Family: Three times a week    Attends Religious Services: More than 4 times per year    Active Member of Clubs or Organizations: Yes    Attends Banker Meetings: More than 4 times per year    Marital Status: Widowed  Intimate Partner Violence: Not At Risk (12/26/2023)   Humiliation, Afraid, Rape, and Kick questionnaire    Fear of Current or Ex-Partner: No    Emotionally Abused: No    Physically Abused: No    Sexually Abused: No      PHYSICAL EXAM Generalized: Well developed, in no acute distress  Neurological examination  Mentation: Alert oriented to time, place, history taking. Follows all commands speech and language fluent Cranial nerve II-XII: Facial symmetry noted.   DIAGNOSTIC DATA (LABS, IMAGING, TESTING) - I reviewed patient records, labs, notes, testing and imaging myself where available.  Lab Results  Component Value Date   WBC 7.6 04/08/2024   HGB 12.2 04/08/2024   HCT 36.9 04/08/2024   MCV 88.7 04/08/2024   PLT 278 04/08/2024      Component Value Date/Time   NA 136 05/12/2024 1404   NA 138 10/23/2023 1120   K 3.9 05/12/2024 1404   CL 99 05/12/2024 1404   CO2 26 05/12/2024 1404   GLUCOSE 119 (H) 05/12/2024 1404   BUN 28 (H) 05/12/2024 1404   BUN 36 (H) 10/23/2023 1120   CREATININE 1.25 (H) 05/12/2024 1404   CALCIUM  9.3 05/12/2024 1404   PROT 7.0 03/06/2024 1424   PROT 7.2 10/23/2023 1120   ALBUMIN 4.0 02/01/2024 1449   ALBUMIN 4.1  10/23/2023 1120   AST 16 03/06/2024 1424   AST 14 (L) 02/01/2024 1449   ALT 11 03/06/2024 1424   ALT 8 02/01/2024 1449   ALKPHOS 99 02/01/2024 1449   BILITOT 0.2 03/06/2024 1424   BILITOT 0.3 02/01/2024 1449   GFRNONAA 42 (L) 02/01/2024 1449   GFRNONAA 45 (L) 02/10/2020 1222   GFRAA 58 (L) 09/23/2020 1627   GFRAA 52 (L) 02/10/2020 1222   Lab Results  Component Value Date   CHOL 120 10/23/2023   HDL 60 10/23/2023   LDLCALC 38 10/23/2023   TRIG 125 10/23/2023   CHOLHDL 2.0 10/23/2023   Lab Results  Component Value Date   HGBA1C 5.6 10/23/2023   Lab Results  Component Value Date   VITAMINB12 198 06/01/2023   Lab Results  Component Value Date   TSH 2.460 05/17/2023      ASSESSMENT AND PLAN 73 y.o. year old female  has a past medical history of Agatston coronary artery calcium  score greater than 400 (05/2022), Anemia, Back pain, Blood transfusion, Cancer (HCC), Chronic diastolic CHF (congestive heart failure) (HCC), Chronic kidney disease, COVID, DDD (degenerative disc disease), cervical, DDD (degenerative disc disease), lumbar, Family history of coronary arteriosclerosis- strong fam h/o CAD and early CAD.  (07/17/2017), Fibromyalgia, GERD (gastroesophageal reflux disease), Heart murmur, Hiatal hernia, High blood pressure, High cholesterol, IBS (irritable bowel syndrome), OSA (obstructive sleep apnea), Osteoarthritis, Peripheral autonomic neuropathy of unknown cause, Pneumonia, Pre-diabetes, PVC (premature ventricular contraction), Raynaud disease, Rheumatoid arthritis (HCC), Sjogren's disease, Urticaria, and Vitamin D  deficiency. here with:   Peripheral neuropathy  Continue gabapentin  600 mg twice a day Continue Cymbalta  30 mg daily Advised if symptoms worsen or she develops new symptoms she should let us  know Follow-up in 6 to 8 months or sooner if needed   Meds ordered this encounter  Medications   gabapentin  (NEURONTIN ) 300 MG capsule    Sig: Take 2 capsules (600 mg  total) by mouth 2 (two) times daily.    Dispense:  360 capsule    Refill:  3    Supervising Provider:   YAN, YIJUN [[id number]]     Duwaine Russell, MSN, NP-C 05/20/2024, 2:41 PM [facility] [address], [address] [place], [place] [address] [phone]

## 2024-05-20 ENCOUNTER — Telehealth (INDEPENDENT_AMBULATORY_CARE_PROVIDER_SITE_OTHER): Admitting: Adult Health

## 2024-05-20 ENCOUNTER — Telehealth: Payer: Self-pay

## 2024-05-20 DIAGNOSIS — G629 Polyneuropathy, unspecified: Secondary | ICD-10-CM

## 2024-05-20 MED ORDER — GABAPENTIN 300 MG PO CAPS: 600.0000 mg | ORAL_CAPSULE | Freq: Two times a day (BID) | ORAL | 3 refills | Status: AC

## 2024-05-20 NOTE — Patient Instructions (Addendum)
 Your Plan:  Continue gabapentin  600 mg twice a day Continue Cymbalta  30 mg daily     Thank you for coming to see us  at [facility]. I hope we have been able to provide you high quality care today.  You may receive a patient satisfaction survey over the next few weeks. We would appreciate your feedback and comments so that we may continue to improve ourselves and the health of our patients.

## 2024-05-20 NOTE — Telephone Encounter (Signed)
 Copied from CRM #[id number]. Topic: Clinical - Home Health Verbal Orders >> May 20, 2024  9:45 AM Avram MATSU wrote: Caller/Agency: Marcey Rushing Number: [phone] Service Requested: Physical Therapy Frequency: starting today 1*8 Any new concerns about the patient? No

## 2024-05-20 NOTE — Telephone Encounter (Signed)
 This is fine if they need verbal order! Thank you!

## 2024-05-20 NOTE — Telephone Encounter (Signed)
 Called Marcey gave verbal orders Per Saddie

## 2024-05-21 ENCOUNTER — Encounter: Payer: Self-pay | Admitting: Oncology

## 2024-05-22 ENCOUNTER — Telehealth: Payer: Self-pay

## 2024-05-22 NOTE — Telephone Encounter (Signed)
 Copied from CRM [id number]. Topic: Clinical - Home Health Verbal Orders >> May 22, 2024  4:16 PM Shanda MATSU wrote: Caller/Agency: Melissa with OT [facility] Callback Number: [phone] Service Requested: Occupational Therapy Frequency: once per week Any new concerns about the patient? No

## 2024-05-23 ENCOUNTER — Telehealth: Payer: Self-pay | Admitting: Oncology

## 2024-05-23 ENCOUNTER — Other Ambulatory Visit: Payer: Self-pay

## 2024-05-23 DIAGNOSIS — E538 Deficiency of other specified B group vitamins: Secondary | ICD-10-CM

## 2024-05-23 MED ORDER — FOLIC ACID 1 MG PO TABS: 1.0000 mg | ORAL_TABLET | Freq: Every day | ORAL | 5 refills | Status: AC

## 2024-05-23 NOTE — Telephone Encounter (Signed)
 Called the patient to reschedule her appointments per her message in MyChart. She is aware.

## 2024-05-24 DIAGNOSIS — I493 Ventricular premature depolarization: Secondary | ICD-10-CM | POA: Diagnosis not present

## 2024-05-28 ENCOUNTER — Telehealth: Payer: Self-pay | Admitting: *Deleted

## 2024-05-28 NOTE — Telephone Encounter (Signed)
 Copied from CRM [id number]. Topic: Clinical - Home Health Verbal Orders >> May 28, 2024  4:56 PM Wess RAMAN wrote: Caller/Agency: Elrod, Melissa/ [facility]  She would like an update on patient's home health orders  Callback #: [phone]

## 2024-05-28 NOTE — Telephone Encounter (Signed)
 Contacted melissa and she was needing a verbal for ot for this patient.  She stated that pt may be graduating from ot soon

## 2024-05-29 ENCOUNTER — Ambulatory Visit

## 2024-05-29 DIAGNOSIS — Z0000 Encounter for general adult medical examination without abnormal findings: Secondary | ICD-10-CM

## 2024-05-29 NOTE — Progress Notes (Signed)
 Subjective:   [name] is a 73 y.o. who presents for a Medicare Wellness preventive visit.  As a reminder, Annual Wellness Visits don't include a physical exam, and some assessments may be limited, especially if this visit is performed virtually. We may recommend an in-person follow-up visit with your provider if needed.  Visit Complete: Virtual I connected with  [name] on 05/29/24 by a audio enabled telemedicine application and verified that I am speaking with the correct person using two identifiers.  Patient Location: Home  Provider Location: Home Office  I discussed the limitations of evaluation and management by telemedicine. The patient expressed understanding and agreed to proceed.  Vital Signs: Because this visit was a virtual/telehealth visit, some criteria may be missing or patient reported. Any vitals not documented were not able to be obtained and vitals that have been documented are patient reported.  VideoError- Librarian, academic were attempted between this provider and patient, however failed, due to patient having technical difficulties OR patient did not have access to video capability.  We continued and completed visit with audio only.   Persons Participating in Visit: Patient.  AWV Questionnaire: Yes: Patient Medicare AWV questionnaire was completed by the patient on 05/27/2024; I have confirmed that all information answered by patient is correct and no changes since this date.  Cardiac Risk Factors include: advanced age (>45men, >52 women);dyslipidemia;hypertension     Objective:    Today's Vitals   05/29/24 1458  PainSc: 4    There is no height or weight on file to calculate BMI.     05/29/2024    3:07 PM 02/01/2024    3:28 PM 12/26/2023    8:21 AM 12/26/2023    1:08 AM 12/12/2023    6:04 PM 12/12/2023   11:44 AM 12/10/2023   11:49 AM  Advanced Directives  Does Patient Have a Medical Advance Directive? Yes No Yes  Yes Yes  Yes  Type of Estate agent of [place];Living will  Healthcare Power of [employer] Power of [employer] Power of [place];Living will Healthcare Power of [place];Living will  Does patient want to make changes to medical advance directive?   No - Patient declined No - Patient declined No - Patient declined    Copy of Healthcare Power of Attorney in Chart? No - copy requested  No - copy requested No - copy requested  No - copy requested   Would patient like information on creating a medical advance directive?  No - Patient declined         Current Medications (verified) Outpatient Encounter Medications as of 05/29/2024  Medication Sig   amoxicillin  (AMOXIL ) 500 MG capsule Take 2 capsules (1,000 mg total) by mouth 3 (three) times daily.   aspirin  EC 81 MG tablet Take 1 tablet (81 mg total) by mouth daily. Swallow whole.   cetirizine  (ZYRTEC ) 10 MG tablet TAKE 1 TABLET (10 MG TOTAL) BY MOUTH DAILY.   colestipol  (COLESTID ) 1 g tablet Take 1-2 g by mouth daily as needed (ibs flare up).   cycloSPORINE  (RESTASIS ) 0.05 % ophthalmic emulsion Place 2 drops into both eyes 2 (two) times daily as needed (dry eyes).   diphenoxylate -atropine  (LOMOTIL ) 2.5-0.025 MG tablet as needed.   DULoxetine  (CYMBALTA ) 30 MG capsule Take 1 capsule (30 mg total) by mouth daily.   fenofibrate  (TRICOR ) 48 MG tablet Take 1 tablet (48 mg total) by mouth daily.   folic acid  (FOLVITE ) 1 MG tablet Take  1 tablet (1 mg total) by mouth daily.   gabapentin  (NEURONTIN ) 300 MG capsule Take 2 capsules (600 mg total) by mouth 2 (two) times daily.   hydroxychloroquine  (PLAQUENIL ) 200 MG tablet Take 1 tablet (200 mg total) by mouth daily.   hyoscyamine  (ANASPAZ ) 0.125 MG TBDP disintergrating tablet Place 0.125 mg under the tongue every 6 (six) hours as needed for cramping.   ibuprofen  (ADVIL ) 800 MG tablet as needed.   LORazepam  (ATIVAN ) 0.5 MG tablet Take 1 tablet (0.5 mg total) by mouth at  bedtime as needed for sleep. At bedtime prn   methocarbamol  (ROBAXIN ) 500 MG tablet Take 1 tablet (500 mg total) by mouth every 6 (six) hours as needed for muscle spasms.   metoprolol  succinate (TOPROL -XL) 25 MG 24 hr tablet TAKE 1/2 TABLET BY MOUTH (12.5 MG TOTAL) DAILY   Multiple Vitamin (MULTIVITAMIN WITH MINERALS) TABS tablet Take 1 tablet by mouth daily.   NONFORMULARY OR COMPOUNDED ITEM Apply 1 application  topically daily as needed (irritation). Triamcinolone  0.1% & Silvadene cream 1:1- Apply as directed to affected areas as needed   nystatin  cream (MYCOSTATIN ) Apply 1 Application topically daily. (Patient taking differently: Apply 1 Application topically as needed.)   omeprazole (PRILOSEC) 40 MG capsule Take 40 mg by mouth daily as needed (acid reflux).   potassium chloride  (KLOR-CON ) 10 MEQ tablet Take 2 tablets (20 mEq total) by mouth daily.   promethazine  (PHENERGAN ) 25 MG tablet as needed.   rosuvastatin  (CRESTOR ) 10 MG tablet Take 1 tablet (10 mg total) by mouth daily.   spironolactone  (ALDACTONE ) 25 MG tablet TAKE 1 TABLET (25 MG TOTAL) BY MOUTH DAILY.   torsemide  40 MG TABS Take 40 mg by mouth daily.   ferrous sulfate  325 (65 FE) MG tablet Take 1 tablet (325 mg total) by mouth every other day. (Patient not taking: Reported on 05/29/2024)   ondansetron  (ZOFRAN ) 4 MG tablet Take 1 tablet (4 mg total) by mouth every 6 (six) hours as needed for nausea. (Patient not taking: Reported on 05/29/2024)   oxyCODONE  (OXY IR/ROXICODONE ) 5 MG immediate release tablet Take 1 tablet (5 mg total) by mouth every 6 (six) hours as needed for up to 5 days for severe pain (pain score 7-10) or breakthrough pain. (Patient not taking: Reported on 05/29/2024)   polyethylene glycol (MIRALAX ) 17 g packet Take 17 g by mouth daily as needed for mild constipation. (Patient not taking: Reported on 05/29/2024)   No facility-administered encounter medications on file as of 05/29/2024.    Allergies (verified) Sulfa  antibiotics, Cymbalta  [duloxetine  hcl], Demerol, Ivp dye [iodinated contrast media], Nsaids, and Adhesive [tape]   History: Past Medical History:  Diagnosis Date   Agatston coronary artery calcium  score greater than 400 05/2022   coronary Ca score 856   Anemia    Back pain    Blood transfusion    1981   Cancer (HCC)    basal cell carcinoma, x 3   on leg and bottom of spine   Chronic diastolic CHF (congestive heart failure) (HCC)    diastolic    Chronic kidney disease    CKD3a   COVID    DDD (degenerative disc disease), cervical    DDD (degenerative disc disease), lumbar    Family history of coronary arteriosclerosis- strong fam h/o CAD and early CAD.  07/17/2017   Fibromyalgia    GERD (gastroesophageal reflux disease)    Heart murmur    as a child   Hiatal hernia    sjorgens  syndrome   High blood pressure    High cholesterol    IBS (irritable bowel syndrome)    OSA (obstructive sleep apnea)    Osteoarthritis    Peripheral autonomic neuropathy of unknown cause    Pneumonia    Pre-diabetes    PVC (premature ventricular contraction)    Raynaud disease    Rheumatoid arthritis (HCC)    Sjogren's disease    Urticaria    Vitamin D  deficiency    Past Surgical History:  Procedure Laterality Date   ABDOMINAL HYSTERECTOMY     BTL, BSO   ADENOIDECTOMY     BREAST EXCISIONAL BIOPSY Left    BREAST SURGERY     mass removal    CHOLECYSTECTOMY     dental implants     DIAGNOSTIC LAPAROSCOPY     x3   FEMUR FRACTURE SURGERY Right    I & D KNEE WITH POLY EXCHANGE Right 12/12/2023   Procedure: IRRIGATION AND DEBRIDEMENT KNEE WITH POLY EXCHANGE;  Surgeon: Melodi Lerner, MD;  Location: [facility];  Service: Orthopedics;  Laterality: Right;   KNEE ARTHROSCOPY     x2   MASS EXCISION Left 03/22/2017   Procedure: EXCISION OF LEFT BREAST MASS;  Surgeon: Belinda Cough, MD;  Location: [facility];  Service: General;  Laterality: Left;   MULTIPLE TOOTH EXTRACTIONS  06/2023   x5   ORIF ANKLE  FRACTURE Right 12/29/2023   Procedure: OPEN REDUCTION INTERNAL FIXATION (ORIF) ANKLE FRACTURE;  Surgeon: Barton Drape, MD;  Location: [facility];  Service: Orthopedics;  Laterality: Right;   ORIF FEMUR FRACTURE Right 06/22/2022   Procedure: RIGHT OPEN REDUCTION INTERNAL FIXATION (ORIF) DISTAL FEMUR FRACTURE;  Surgeon: Celena Sharper, MD;  Location: [facility];  Service: Orthopedics;  Laterality: Right;   patotid cystectomy     RIGHT/LEFT HEART CATH AND CORONARY ANGIOGRAPHY N/A 07/25/2019   Procedure: RIGHT/LEFT HEART CATH AND CORONARY ANGIOGRAPHY;  Surgeon: Cherrie Toribio SAUNDERS, MD;  Location: [facility];  Service: Cardiovascular;  Laterality: N/A;   TENDON REPAIR  1980   left ankle and tibia   TONSILLECTOMY     TOTAL KNEE ARTHROPLASTY  06/21/2011   Procedure: TOTAL KNEE ARTHROPLASTY;  Surgeon: Lerner LULLA Melodi;  Location: [facility];  Service: Orthopedics;  Laterality: Right;   TUBAL LIGATION     Family History  Problem Relation Age of Onset   Alzheimer's disease Mother    Heart attack Mother    Hypertension Mother    Glaucoma Mother    High Cholesterol Mother    Heart disease Mother    Depression Mother    Cancer Father    High Cholesterol Father    Heart disease Father    Sleep apnea Father    Heart attack Brother    Heart disease Brother    Glaucoma Brother    Hyperlipidemia Brother    Glaucoma Brother    Breast cancer Maternal Aunt    Asthma Son    Allergic rhinitis Neg Hx    Angioedema Neg Hx    Eczema Neg Hx    Urticaria Neg Hx    Neuropathy Neg Hx    Social History   Socioeconomic History   Marital status: Widowed    Spouse name: Not on file   Number of children: 2   Years of education: AS   Highest education level: Bachelor's degree (e.g., BA, AB, BS)  Occupational History   Occupation: retired Engineer, civil (consulting)   Occupation: RN  Tobacco Use   Smoking status: Never  Passive exposure: Past   Smokeless tobacco: Never  Vaping Use   Vaping status: Never Used   Substance and Sexual Activity   Alcohol  use: Yes    Comment: occ   Drug use: No   Sexual activity: Never  Other Topics Concern   Not on file  Social History Narrative   Lives at home alone   Right-handed   Drinks 1 or less cups of coffee and 2 servings of either tea or soda per day   Social Drivers of Health   Financial Resource Strain: Low Risk  (05/27/2024)   Overall Financial Resource Strain (CARDIA)    Difficulty of Paying Living Expenses: Not hard at all  Food Insecurity: No Food Insecurity (05/27/2024)   Hunger Vital Sign    Worried About Running Out of Food in the Last Year: Never true    Ran Out of Food in the Last Year: Never true  Transportation Needs: Unknown (05/27/2024)   PRAPARE - Transportation    Lack of Transportation (Medical): No    Lack of Transportation (Non-Medical): Patient declined  Physical Activity: Insufficiently Active (05/27/2024)   Exercise Vital Sign    Days of Exercise per Week: 1 day    Minutes of Exercise per Session: 20 min  Stress: No Stress Concern Present (05/27/2024)   [employer] of Occupational Health - Occupational Stress Questionnaire    Feeling of Stress: Not at all  Social Connections: Moderately Integrated (05/27/2024)   Social Connection and Isolation Panel    Frequency of Communication with Friends and Family: More than three times a week    Frequency of Social Gatherings with Friends and Family: More than three times a week    Attends Religious Services: 1 to 4 times per year    Active Member of [employer] or Organizations: Yes    Attends Banker Meetings: 1 to 4 times per year    Marital Status: Widowed    Tobacco Counseling Counseling given: Not Answered    Clinical Intake:  Pre-visit preparation completed: Yes  Pain : 0-10 Pain Score: 4  Pain Type: Chronic pain Pain Location: Knee Pain Orientation: Left, Right Pain Descriptors / Indicators: Aching Pain Onset: More than a month ago Pain  Frequency: Constant     Nutritional Risks: None Diabetes: No  Lab Results  Component Value Date   HGBA1C 5.6 10/23/2023   HGBA1C 5.4 05/17/2023   HGBA1C 5.3 03/27/2022     How often do you need to have someone help you when you read instructions, pamphlets, or other written materials from your doctor or pharmacy?: 1 - Never  Interpreter Needed?: No  Information entered by :: NAllen LPN   Activities of Daily Living     05/27/2024   11:45 AM 12/26/2023    8:21 AM  In your present state of health, do you have any difficulty performing the following activities:  Hearing? 0 1  Vision? 0 0  Difficulty concentrating or making decisions? 0 0  Walking or climbing stairs? 1   Dressing or bathing? 0   Doing errands, shopping? 0 0  Preparing Food and eating ? N   Using the Toilet? N   In the past six months, have you accidently leaked urine? Y   Do you have problems with loss of bowel control? N   Managing your Medications? N   Managing your Finances? N   Housekeeping or managing your Housekeeping? Y     Patient Care Team: Chandra Toribio POUR, MD as  PCP - General (Family Medicine) Dolphus Reiter, MD as Consulting Physician (Rheumatology) Shlomo Wilbert SAUNDERS, MD as Consulting Physician (Cardiology) Melodi Lerner, MD as Consulting Physician (Orthopedic Surgery) Jenel Carlin POUR, MD (Inactive) as Consulting Physician (Neurology) Kristie Lamprey, MD as Consulting Physician (Gastroenterology) [place], Amy, MD as Consulting Physician (Dermatology) Prescilla Beams, MD as Consulting Physician (Nephrology) Bobbitt, Elgin Pepper, MD as Consulting Physician (Allergy  and Immunology) Caleen Dirks, MD as Consulting Physician (Internal Medicine)  I have updated your Care Teams any recent Medical Services you may have received from other providers in the past year.     Assessment:   This is a routine wellness examination for [employer].  Hearing/Vision screen Hearing Screening - Comments::  Denies hearing issues Vision Screening - Comments:: Regular eye exams, Dr. Charmayne   Goals Addressed             This Visit's Progress    Patient Stated       05/29/2024, get back to normal       Depression Screen     05/29/2024    3:08 PM 11/02/2023   10:37 AM 07/30/2023    2:42 PM 07/05/2023    1:08 PM 06/14/2023    1:14 PM 12/28/2022   12:43 PM 09/18/2022    2:28 PM  PHQ 2/9 Scores  PHQ - 2 Score 0 0 0 0 0 0 2  PHQ- 9 Score  2 3    5     Fall Risk     05/27/2024   11:45 AM 05/12/2024    1:46 PM 01/17/2024   11:33 AM 07/05/2023    1:08 PM 06/14/2023    1:14 PM  Fall Risk   Falls in the past year? 1 1  1 1   Number falls in past yr: 1 0 1 1 1   Injury with Fall? 1 0 1 1   Risk for fall due to : History of fall(s);Impaired mobility;Impaired balance/gait;Medication side effect Impaired mobility Orthopedic patient Impaired balance/gait;Impaired mobility Impaired balance/gait  Follow up Falls prevention discussed;Falls evaluation completed Falls evaluation completed  Falls evaluation completed Falls evaluation completed    MEDICARE RISK AT HOME:  Medicare Risk at Home Any stairs in or around the home?: (Patient-Rptd) No If so, are there any without handrails?: (Patient-Rptd) No Home free of loose throw rugs in walkways, pet beds, electrical cords, etc?: (Patient-Rptd) Yes Adequate lighting in your home to reduce risk of falls?: (Patient-Rptd) Yes Life alert?: (Patient-Rptd) No Use of a cane, walker or w/c?: (Patient-Rptd) Yes Grab bars in the bathroom?: (Patient-Rptd) Yes Shower chair or bench in shower?: (Patient-Rptd) Yes Elevated toilet seat or a handicapped toilet?: (Patient-Rptd) Yes  TIMED UP AND GO:  Was the test performed?  No  Cognitive Function: 6CIT completed        05/29/2024    3:09 PM 12/28/2022   12:51 PM 09/27/2021    3:41 PM 04/29/2019   11:23 AM  6CIT Screen  What Year? 0 points 0 points 0 points 0 points  What month? 0 points 0 points 0 points  0 points  What time? 0 points 0 points 0 points 0 points  Count back from 20 0 points 0 points 0 points 0 points  Months in reverse 0 points 0 points 0 points 0 points  Repeat phrase 0 points 0 points 0 points 0 points  Total Score 0 points 0 points 0 points 0 points    Immunizations Immunization History  Administered Date(s) Administered   Fluad Quad(high Dose 65+)  05/05/2021   INFLUENZA, HIGH DOSE SEASONAL PF 06/12/2018, 05/17/2019, 05/06/2024   Influenza,inj,Quad PF,6+ Mos 05/23/2016, 06/01/2017   Influenza-Unspecified 06/05/2018, 05/17/2019, 06/07/2020, 05/18/2022, 07/06/2023   PFIZER(Purple Top)SARS-COV-2 Vaccination 09/03/2019, 09/24/2019, 04/01/2020, 10/04/2020   PNEUMOCOCCAL CONJUGATE-20 07/30/2023   Pfizer Covid-19 Vaccine Bivalent Booster 74yrs & up 05/05/2021, 05/18/2022   Pfizer(Comirnaty)Fall Seasonal Vaccine 12 years and older 05/06/2024   Pneumococcal Conjugate-13 07/02/2017   Pneumococcal Polysaccharide-23 05/17/2005   Respiratory Syncytial Virus Vaccine,Recomb Aduvanted(Arexvy) 05/18/2022   Td 10/16/2007   Tdap 03/21/2018   Unspecified SARS-COV-2 Vaccination 07/06/2023   Zoster Recombinant(Shingrix ) 06/10/2019, 12/11/2019    Screening Tests Health Maintenance  Topic Date Due   Colonoscopy  11/21/2022   COVID-19 Vaccine (9 - Pfizer risk 2025-26 season) 11/03/2024   Medicare Annual Wellness (AWV)  05/29/2025   Mammogram  11/05/2025   DTaP/Tdap/Td (3 - Td or Tdap) 03/21/2028   Pneumococcal Vaccine: 50+ Years  Completed   Influenza Vaccine  Completed   DEXA SCAN  Completed   Hepatitis C Screening  Completed   Zoster Vaccines- Shingrix   Completed   Meningococcal B Vaccine  Aged Out    Health Maintenance Items Addressed: Due for colonoscopy, but needs to recover from injuries  Additional Screening:  Vision Screening: Recommended annual ophthalmology exams for early detection of glaucoma and other disorders of the eye. Is the patient up to date with their  annual eye exam?  Yes  Who is the provider or what is the name of the office in which the patient attends annual eye exams? Dr. Charmayne  Dental Screening: Recommended annual dental exams for proper oral hygiene  Community Resource Referral / Chronic Care Management: CRR required this visit?  No   CCM required this visit?  No   Plan:    I have personally reviewed and noted the following in the patient's chart:   Medical and social history Use of alcohol , tobacco or illicit drugs  Current medications and supplements including opioid prescriptions. Patient is not currently taking opioid prescriptions. Functional ability and status Nutritional status Physical activity Advanced directives List of other physicians Hospitalizations, surgeries, and ER visits in previous 12 months Vitals Screenings to include cognitive, depression, and falls Referrals and appointments  In addition, I have reviewed and discussed with patient certain preventive protocols, quality metrics, and best practice recommendations. A written personalized care plan for preventive services as well as general preventive health recommendations were provided to patient.   Marisa FORBES Dawn, LPN   [id number]   After Visit Summary: (MyChart) Due to this being a telephonic visit, the after visit summary with patients personalized plan was offered to patient via MyChart   Notes: Nothing significant to report at this time.

## 2024-05-29 NOTE — Patient Instructions (Signed)
 [name],  Thank you for taking the time for your Medicare Wellness Visit. I appreciate your continued commitment to your health goals. Please review the care plan we discussed, and feel free to reach out if I can assist you further.  Medicare recommends these wellness visits once per year to help you and your care team stay ahead of potential health issues. These visits are designed to focus on prevention, allowing your provider to concentrate on managing your acute and chronic conditions during your regular appointments.  Please note that Annual Wellness Visits do not include a physical exam. Some assessments may be limited, especially if the visit was conducted virtually. If needed, we may recommend a separate in-person follow-up with your provider.  Ongoing Care Seeing your primary care provider every 3 to 6 months helps us  monitor your health and provide consistent, personalized care.   Referrals If a referral was made during today's visit and you haven't received any updates within two weeks, please contact the referred provider directly to check on the status.  Recommended Screenings:  Health Maintenance  Topic Date Due   Colon Cancer Screening  11/21/2022   COVID-19 Vaccine (9 - Pfizer risk 2025-26 season) 11/03/2024   Medicare Annual Wellness Visit  05/29/2025   Breast Cancer Screening  11/05/2025   DTaP/Tdap/Td vaccine (3 - Td or Tdap) 03/21/2028   Pneumococcal Vaccine for age over 93  Completed   Flu Shot  Completed   DEXA scan (bone density measurement)  Completed   Hepatitis C Screening  Completed   Zoster (Shingles) Vaccine  Completed   Meningitis B Vaccine  Aged Out       05/29/2024    3:07 PM  Advanced Directives  Does Patient Have a Medical Advance Directive? Yes  Type of Estate agent of [place];Living will  Copy of Healthcare Power of Attorney in Chart? No - copy requested   Advance Care Planning is important because it: Ensures you  receive medical care that aligns with your values, goals, and preferences. Provides guidance to your family and loved ones, reducing the emotional burden of decision-making during critical moments.  Vision: Annual vision screenings are recommended for early detection of glaucoma, cataracts, and diabetic retinopathy. These exams can also reveal signs of chronic conditions such as diabetes and high blood pressure.  Dental: Annual dental screenings help detect early signs of oral cancer, gum disease, and other conditions linked to overall health, including heart disease and diabetes.  Please see the attached documents for additional preventive care recommendations.

## 2024-05-30 ENCOUNTER — Telehealth: Payer: Self-pay

## 2024-05-30 ENCOUNTER — Ambulatory Visit: Payer: Self-pay | Admitting: Internal Medicine

## 2024-05-30 DIAGNOSIS — I493 Ventricular premature depolarization: Secondary | ICD-10-CM | POA: Diagnosis not present

## 2024-05-30 NOTE — Telephone Encounter (Signed)
 Copied from CRM [id number]. Topic: General - Other >> May 29, 2024  5:29 PM Kevelyn M wrote: Reason for CRM: Redell calling in because of a missed visit for PT. Patient woke up with stomach troubles and would like to start back next week.

## 2024-06-02 ENCOUNTER — Inpatient Hospital Stay: Admitting: Hematology and Oncology

## 2024-06-02 ENCOUNTER — Inpatient Hospital Stay

## 2024-06-02 DIAGNOSIS — Z85828 Personal history of other malignant neoplasm of skin: Secondary | ICD-10-CM | POA: Diagnosis not present

## 2024-06-02 DIAGNOSIS — D225 Melanocytic nevi of trunk: Secondary | ICD-10-CM | POA: Diagnosis not present

## 2024-06-02 DIAGNOSIS — D1801 Hemangioma of skin and subcutaneous tissue: Secondary | ICD-10-CM | POA: Diagnosis not present

## 2024-06-02 DIAGNOSIS — L304 Erythema intertrigo: Secondary | ICD-10-CM | POA: Diagnosis not present

## 2024-06-02 DIAGNOSIS — L821 Other seborrheic keratosis: Secondary | ICD-10-CM | POA: Diagnosis not present

## 2024-06-04 ENCOUNTER — Telehealth: Payer: Self-pay

## 2024-06-04 NOTE — Telephone Encounter (Signed)
 Copied from CRM [id number]. Topic: General - Other >> Jun 04, 2024  4:34 PM Santiya F wrote: Reason for CRM: Redell with [facility] is calling in to report a missed a visit. Patient called him today to cancel.

## 2024-06-12 ENCOUNTER — Ambulatory Visit: Admitting: Infectious Diseases

## 2024-06-12 ENCOUNTER — Other Ambulatory Visit: Payer: Self-pay

## 2024-06-12 VITALS — BP 106/49 | HR 64 | Temp 99.2°F

## 2024-06-12 DIAGNOSIS — M0579 Rheumatoid arthritis with rheumatoid factor of multiple sites without organ or systems involvement: Secondary | ICD-10-CM | POA: Diagnosis not present

## 2024-06-12 DIAGNOSIS — N189 Chronic kidney disease, unspecified: Secondary | ICD-10-CM

## 2024-06-12 DIAGNOSIS — Z5181 Encounter for therapeutic drug level monitoring: Secondary | ICD-10-CM | POA: Diagnosis not present

## 2024-06-12 DIAGNOSIS — T8453XD Infection and inflammatory reaction due to internal right knee prosthesis, subsequent encounter: Secondary | ICD-10-CM | POA: Diagnosis not present

## 2024-06-12 DIAGNOSIS — Z7185 Encounter for immunization safety counseling: Secondary | ICD-10-CM

## 2024-06-12 MED ORDER — AMOXICILLIN 500 MG PO CAPS: 500.0000 mg | ORAL_CAPSULE | Freq: Three times a day (TID) | ORAL | 5 refills | Status: AC

## 2024-06-12 NOTE — Progress Notes (Addendum)
 Patient Active Problem List   Diagnosis Date Noted   Medication monitoring encounter 06/12/2024   Immunization counseling 06/12/2024   Leg swelling 04/08/2024   Medication management 01/08/2024   Obesity, Class III, BMI 40-49.9 (morbid obesity) (HCC) 01/01/2024   Right displaced trimalleolar ankle fracture with syndesmosis disruption 12/26/2023   Fall at home, initial encounter 12/26/2023   History of rheumatoid arthritis 12/26/2023   Hypokalemia 12/26/2023   Itching 12/14/2023   Infection of prosthetic right knee joint 12/12/2023   IBS (irritable bowel syndrome) 11/02/2023   IDA (iron  deficiency anemia) 06/05/2023   Normocytic normochromic anemia 06/05/2023   Normocytic anemia 06/01/2023   Situational anxiety 10/15/2022   Hx of Pressure sore of left ischial area, stage IV- healed as of February 2025 09/10/2022   DNR (do not resuscitate) 06/21/2022   Agatston coronary artery calcium  score greater than 400 05/25/2022   Laryngopharyngeal reflux (LPR) 10/10/2021   Chronic maxillary sinusitis 10/10/2021   Prediabetes 02/14/2021   Chronic kidney disease, stage 3b (HCC) 08/01/2020   Post-menopausal 09/02/2019   Essential hypertension 08/20/2018   Recurrent urticaria 05/20/2018   Vitamin D  deficiency 11/20/2017   Hyperlipidemia 11/20/2017   Raynaud's disease without gangrene 03/12/2017   Eosinophilic esophagitis 10/12/2016   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   History of IBS 09/03/2016   Autoimmune disease 09/02/2016   Abnormality of gait 05/09/2016   Sjogren syndrome 02/29/2016   GERD (gastroesophageal reflux disease) 01/19/2016   Glucose intolerance (impaired glucose tolerance) 01/19/2016   Chronic diastolic CHF (congestive heart failure) (HCC) 04/20/2015   Peripheral neuropathy 02/02/2014   Class 2 obesity due to excess calories with body mass index (BMI) of 36.0 to 36.9 in adult 11/17/2013   Heart murmur    OSA (obstructive sleep apnea)    Rheumatoid arthritis  (HCC)    Hiatal hernia    Fibromyalgia    History of PVC (premature ventricular contraction)    Unilateral primary osteoarthritis, left knee 06/22/2011   Current Outpatient Medications on File Prior to Visit  Medication Sig Dispense Refill   amoxicillin  (AMOXIL ) 500 MG capsule Take 2 capsules (1,000 mg total) by mouth 3 (three) times daily. 180 capsule 2   aspirin  EC 81 MG tablet Take 1 tablet (81 mg total) by mouth daily. Swallow whole. 30 tablet 12   cetirizine  (ZYRTEC ) 10 MG tablet TAKE 1 TABLET (10 MG TOTAL) BY MOUTH DAILY. 90 tablet 1   colestipol  (COLESTID ) 1 g tablet Take 1-2 g by mouth daily as needed (ibs flare up).     cycloSPORINE  (RESTASIS ) 0.05 % ophthalmic emulsion Place 2 drops into both eyes 2 (two) times daily as needed (dry eyes).     diphenoxylate -atropine  (LOMOTIL ) 2.5-0.025 MG tablet as needed.     DULoxetine  (CYMBALTA ) 30 MG capsule Take 1 capsule (30 mg total) by mouth daily. 30 capsule 11   fenofibrate  (TRICOR ) 48 MG tablet Take 1 tablet (48 mg total) by mouth daily. 90 tablet 1   ferrous sulfate  325 (65 FE) MG tablet Take 1 tablet (325 mg total) by mouth every other day. (Patient not taking: Reported on 05/29/2024) 14 tablet 0   folic acid  (FOLVITE ) 1 MG tablet Take 1 tablet (1 mg total) by mouth daily. 30 tablet 5   gabapentin  (NEURONTIN ) 300 MG capsule Take 2 capsules (600 mg total) by mouth 2 (two) times daily. 360 capsule 3   hydroxychloroquine  (PLAQUENIL ) 200 MG tablet Take 1 tablet (200 mg total) by mouth daily. 90 tablet 0  hyoscyamine  (ANASPAZ ) 0.125 MG TBDP disintergrating tablet Place 0.125 mg under the tongue every 6 (six) hours as needed for cramping.     ibuprofen  (ADVIL ) 800 MG tablet as needed.     LORazepam  (ATIVAN ) 0.5 MG tablet Take 1 tablet (0.5 mg total) by mouth at bedtime as needed for sleep. At bedtime prn 3 tablet 0   methocarbamol  (ROBAXIN ) 500 MG tablet Take 1 tablet (500 mg total) by mouth every 6 (six) hours as needed for muscle spasms. 40  tablet 0   metoprolol  succinate (TOPROL -XL) 25 MG 24 hr tablet TAKE 1/2 TABLET BY MOUTH (12.5 MG TOTAL) DAILY 45 tablet 3   Multiple Vitamin (MULTIVITAMIN WITH MINERALS) TABS tablet Take 1 tablet by mouth daily.     NONFORMULARY OR COMPOUNDED ITEM Apply 1 application  topically daily as needed (irritation). Triamcinolone  0.1% & Silvadene cream 1:1- Apply as directed to affected areas as needed     nystatin  cream (MYCOSTATIN ) Apply 1 Application topically daily. (Patient taking differently: Apply 1 Application topically as needed.)     omeprazole (PRILOSEC) 40 MG capsule Take 40 mg by mouth daily as needed (acid reflux).     ondansetron  (ZOFRAN ) 4 MG tablet Take 1 tablet (4 mg total) by mouth every 6 (six) hours as needed for nausea. (Patient not taking: Reported on 05/29/2024) 20 tablet 0   oxyCODONE  (OXY IR/ROXICODONE ) 5 MG immediate release tablet Take 1 tablet (5 mg total) by mouth every 6 (six) hours as needed for up to 5 days for severe pain (pain score 7-10) or breakthrough pain. (Patient not taking: Reported on 05/29/2024)     polyethylene glycol (MIRALAX ) 17 g packet Take 17 g by mouth daily as needed for mild constipation. (Patient not taking: Reported on 05/29/2024)     potassium chloride  (KLOR-CON ) 10 MEQ tablet Take 2 tablets (20 mEq total) by mouth daily. 180 tablet 3   promethazine  (PHENERGAN ) 25 MG tablet as needed.     rosuvastatin  (CRESTOR ) 10 MG tablet Take 1 tablet (10 mg total) by mouth daily. 90 tablet 1   spironolactone  (ALDACTONE ) 25 MG tablet TAKE 1 TABLET (25 MG TOTAL) BY MOUTH DAILY. 90 tablet 0   torsemide  40 MG TABS Take 40 mg by mouth daily. 30 tablet 0   No current facility-administered medications on file prior to visit.    Subjective: 73 year old female with prior history of basal cell carcinoma, CHF, CKD, DDD, GERD, Sjogren's syndrome, RA not on tx, Raynaud's phenomenon, peripheral neuropathy, fibromyalgia, HTN, HLD, obesity/OSA, OA, prediabetes, right knee  arthroplasty, prolonged healing of sacral decub infection (managed with initial antibiotics and wound care) admitted who is here for HFU ( 4/30-5/3) for rt knee PJI.   She got re-admitted 5/14-5/21 after a fall leading to rt ankle #. She underwent ORIF on 5/17. She was seen by Ortho for her rt knee while admitted and was told her wound healing as expected.   5/27 Accompanied by her daughter. She comes from SNF. Receiving IV ampicillin  through PICC. MAR reviewed, not on plaquenil  or any treatment for RA. She reports some hot flushes and need to pee a lot while on antibiotics. Denies urgency or burning of urine. Denies fevers and chills. Denies nausea, vomiting and diarrhea. Rt knee wound has healed. Has a fu with Ortho for rt ankle Thursday and for Rt knee with Dr Hiram in June. No other concerns.   6/9 Accompanied by daughter. Getting IV ampicillin  through PICC without any concerns like nausea, vomiting, diarrhea. Reports mild  redness at PICC insertion site but no other symptoms concerning of infection like warmth, redness, swelling and tenderness. Rt knee wound has healed. She has not seen Dr Hiram after last visit. She has seen Ortho for rft ankle. Labs drawn from 6/9 and last Monday which has been requested from SNF.   7/24 Completed IV ampicillin  on 6/12 after which PICC removed and started taking Amoxicillin  1g po tid without missing doses or any concerns. Last seen by JONETTA Mountain on 7/3 and next appt is tomorrow and will decide if she can be WB. Seen by Ortho for rt knee on 7/8, Dr Hiram on 6/17.  Seen by Hematology on 6/20 for anemia. Next fu with Dr Hiram is in Aug. No concerns for infection in rt hip and ankle.  She has a fu with Rheum up coming. No complaints.    8/26 Compliant with PO amoxicillin . Currently at independent living. Seen by Dr Hiram 8/5. However, last Friday developed sharp pain in her left knee all of a sudden, some increased swelling/soreness. She is also doing PT and  rt knee hurts when moves around and walks, no concerns in rt ankle. No pain in rt knee when sits still. Rt leg is also swollen. Denies fevers, chills. Denies nausea, vomiting, diarrhea. Denies malaise.   9/29 She was seen by Ortho couple of times after last visit and not thought to have rt knee infection and swelling more related to fluid overload. She has been closely following with Nephrology and on diuretics which has helped with the leg swelling. She would like to get BMP with us  today for Nephrology appt.  She has an appt with Rheumatology  tomorrow. Lives in independent living. Compliant with amoxicillin  and can take it as ling as needed. No concerns otherwise.  10/30 Compliant with PO amoxicillin . Walks with walker some at home and doing PT. Got Flu and covid vaccine. Reports she does not need to fu with her surgeons anymore. Started on plaquenil  by Rheumatology. Feels like neuropathic pain in rt foot. She has been started on cymbalta . Wants to get LFT added to today's lab for saving trip to Rheumatology. She is worried about infection in her knee may flare if stopped abtx. No new concerns.   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  07/17/2017   Fibromyalgia    GERD (gastroesophageal reflux disease)    Heart murmur    as a child   Hiatal hernia    sjorgens syndrome   High blood pressure    High  cholesterol    IBS (irritable bowel syndrome)    OSA (obstructive sleep apnea)    Osteoarthritis    Peripheral autonomic neuropathy of unknown cause    Pneumonia    Pre-diabetes  PVC (premature ventricular contraction)    Raynaud disease    Rheumatoid arthritis (HCC)    Sjogren's disease    Urticaria    Vitamin D  deficiency    Past Surgical History:  Procedure Laterality Date   ABDOMINAL HYSTERECTOMY     BTL, BSO   ADENOIDECTOMY     BREAST EXCISIONAL BIOPSY Left    BREAST SURGERY     mass removal    CHOLECYSTECTOMY     dental implants     DIAGNOSTIC LAPAROSCOPY     x3   FEMUR FRACTURE SURGERY Right    I & D KNEE WITH POLY EXCHANGE Right 12/12/2023   Procedure: IRRIGATION AND DEBRIDEMENT KNEE WITH POLY EXCHANGE;  Surgeon: Melodi Lerner, MD;  Location: [facility];  Service: Orthopedics;  Laterality: Right;   KNEE ARTHROSCOPY     x2   MASS EXCISION Left 03/22/2017   Procedure: EXCISION OF LEFT BREAST MASS;  Surgeon: Belinda Cough, MD;  Location: [facility];  Service: General;  Laterality: Left;   MULTIPLE TOOTH EXTRACTIONS  06/2023   x5   ORIF ANKLE FRACTURE Right 12/29/2023   Procedure: OPEN REDUCTION INTERNAL FIXATION (ORIF) ANKLE FRACTURE;  Surgeon: Barton Drape, MD;  Location: [facility];  Service: Orthopedics;  Laterality: Right;   ORIF FEMUR FRACTURE Right 06/22/2022   Procedure: RIGHT OPEN REDUCTION INTERNAL FIXATION (ORIF) DISTAL FEMUR FRACTURE;  Surgeon: Celena Sharper, MD;  Location: [facility];  Service: Orthopedics;  Laterality: Right;   patotid cystectomy     RIGHT/LEFT HEART CATH AND CORONARY ANGIOGRAPHY N/A 07/25/2019   Procedure: RIGHT/LEFT HEART CATH AND CORONARY ANGIOGRAPHY;  Surgeon: Cherrie Toribio SAUNDERS, MD;  Location: [facility];  Service: Cardiovascular;  Laterality: N/A;   TENDON REPAIR  1980   left ankle and tibia   TONSILLECTOMY     TOTAL KNEE ARTHROPLASTY  06/21/2011   Procedure: TOTAL KNEE ARTHROPLASTY;  Surgeon: Lerner LULLA Melodi;  Location: [facility];   Service: Orthopedics;  Laterality: Right;   TUBAL LIGATION      Social History   Tobacco Use   Smoking status: Never    Passive exposure: Past   Smokeless tobacco: Never  Vaping Use   Vaping status: Never Used  Substance Use Topics   Alcohol  use: Yes    Comment: occ   Drug use: No    Family History  Problem Relation Age of Onset   Alzheimer's disease Mother    Heart attack Mother    Hypertension Mother    Glaucoma Mother    High Cholesterol Mother    Heart disease Mother    Depression Mother    Cancer Father    High Cholesterol Father    Heart disease Father    Sleep apnea Father    Heart attack Brother    Heart disease Brother    Glaucoma Brother    Hyperlipidemia Brother    Glaucoma Brother    Breast cancer Maternal Aunt    Asthma Son    Allergic rhinitis Neg Hx    Angioedema Neg Hx    Eczema Neg Hx    Urticaria Neg Hx    Neuropathy Neg Hx     Allergies  Allergen Reactions   Sulfa Antibiotics Hives and Itching    Flushing   Cymbalta  [Duloxetine  Hcl] Other (See Comments)    Manic reaction   Demerol Hives and Other (See Comments)    Fever    Ivp Dye [Iodinated Contrast Media] Hives and Other (See Comments)  Fever   Nsaids Other (See Comments)    CKD stage 3b - GFR 41   Adhesive [Tape] Rash    Health Maintenance  Topic Date Due   Colonoscopy  11/21/2022   COVID-19 Vaccine (9 - Pfizer risk 2025-26 season) 11/03/2024   Medicare Annual Wellness (AWV)  05/29/2025   Mammogram  11/05/2025   DTaP/Tdap/Td (3 - Td or Tdap) 03/21/2028   Pneumococcal Vaccine: 50+ Years  Completed   Influenza Vaccine  Completed   DEXA SCAN  Completed   Hepatitis C Screening  Completed   Zoster Vaccines- Shingrix   Completed   Meningococcal B Vaccine  Aged Out    Objective: BP (!) 106/49   Pulse 64   Temp 99.2 F (37.3 C) (Oral)   SpO2 96%    Physical Exam Constitutional:      Appearance: Normal appearance.  HENT:     Head: Normocephalic and atraumatic.       Mouth: Mucous membranes are moist.  Eyes:    Conjunctiva/sclera: Conjunctivae normal.     Pupils: Pupils are equal, round, and b/l symmetrical    Cardiovascular:     Rate and Rhythm: Normal rate and regular rhythm.     Heart sounds:   Pulmonary:     Effort: Pulmonary effort is normal.     Breath sounds:   Abdominal:     General: Non distended     Palpations:   Musculoskeletal:        General: sitting in the wheelchair,  Rt knee surgical site has healed.  Skin:    General: Skin is warm and dry.     Comments: no rashes   Neurological:     General: grossly non focal     Mental Status: awake, alert and oriented to person, place, and time.   Psychiatric:        Mood and Affect: Mood normal.   Lab Results Lab Results  Component Value Date   WBC 7.6 04/08/2024   HGB 12.2 04/08/2024   HCT 36.9 04/08/2024   MCV 88.7 04/08/2024   PLT 278 04/08/2024    Lab Results  Component Value Date   CREATININE 1.25 (H) 05/12/2024   BUN 28 (H) 05/12/2024   NA 136 05/12/2024   K 3.9 05/12/2024   CL 99 05/12/2024   CO2 26 05/12/2024    Lab Results  Component Value Date   ALT 11 03/06/2024   AST 16 03/06/2024   ALKPHOS 99 02/01/2024   BILITOT 0.2 03/06/2024    Lab Results  Component Value Date   CHOL 120 10/23/2023   HDL 60 10/23/2023   LDLCALC 38 10/23/2023   TRIG 125 10/23/2023   CHOLHDL 2.0 10/23/2023   No results found for: LABRPR, RPRTITER No results found for: HIV1RNAQUANT, HIV1RNAVL, CD4TABS   Assessment/Plan # Right knee PJI - 3/28 aspiration in Ortho clinic with E faecalis - 4/30 I&D with liner exchange.  Or culture with E faecalis - No antibiotics prior to I&D - s/ 6 weeks of IV ampicillin  through 6/12,then switch PO amoxicillin  1g po tid starting 6/13 till date  - Adherence assessed, side effects reviewed/discussed and DDIs reviewed   Plan - will decrease PO amoxicillin  to 500 mg po tid, refills sent. - 9/29 labs reviewed and discussed, CRP down  to 27.2, ESR down to 55  - fu in a month  # CKD  - Labs today   # RA  - on plaquenil   - Fu with Rheumatology   #  Immunization counseling - discussed flu vaccine   I spent 30 minutes involved in face-to-face and non-face-to-face activities for this patient on the day of the visit. Professional time spent includes the following activities: Preparing to see the patient (review of tests), Reviewing Rheumatology note 9/30, Performing a medically appropriate examination and evaluation, Ordering labs/medications, Documenting clinical information in the EMR, Independently interpreting results (not separately reported), Communicating results to the patient, Counseling and educating the patient.   Of note, portions of this note may have been created with voice recognition software. While this note has been edited for accuracy, occasional wrong-word or 'sound-a-like' substitutions may have occurred due to the inherent limitations of voice recognition software.   Annalee Joseph, MD [facility] 06/12/2024, 9:54 AM

## 2024-06-13 ENCOUNTER — Telehealth: Payer: Self-pay

## 2024-06-13 LAB — CBC
HCT: 39.7 % (ref 35.0–45.0)
Hemoglobin: 13.2 g/dL (ref 11.7–15.5)
MCH: 29.3 pg (ref 27.0–33.0)
MCHC: 33.2 g/dL (ref 32.0–36.0)
MCV: 88.2 fL (ref 80.0–100.0)
MPV: 12 fL (ref 7.5–12.5)
Platelets: 260 Thousand/uL (ref 140–400)
RBC: 4.5 Million/uL (ref 3.80–5.10)
RDW: 13.1 % (ref 11.0–15.0)
WBC: 7.4 Thousand/uL (ref 3.8–10.8)

## 2024-06-13 LAB — COMPREHENSIVE METABOLIC PANEL WITH GFR
AG Ratio: 1.5 (calc) (ref 1.0–2.5)
ALT: 10 U/L (ref 6–29)
AST: 13 U/L (ref 10–35)
Albumin: 4 g/dL (ref 3.6–5.1)
Alkaline phosphatase (APISO): 175 U/L — ABNORMAL HIGH (ref 37–153)
BUN/Creatinine Ratio: 20 (calc) (ref 6–22)
BUN: 22 mg/dL (ref 7–25)
CO2: 25 mmol/L (ref 20–32)
Calcium: 9.3 mg/dL (ref 8.6–10.4)
Chloride: 102 mmol/L (ref 98–110)
Creat: 1.1 mg/dL — ABNORMAL HIGH (ref 0.60–1.00)
Globulin: 2.6 g/dL (ref 1.9–3.7)
Glucose, Bld: 119 mg/dL — ABNORMAL HIGH (ref 65–99)
Potassium: 4.1 mmol/L (ref 3.5–5.3)
Sodium: 138 mmol/L (ref 135–146)
Total Bilirubin: 0.4 mg/dL (ref 0.2–1.2)
Total Protein: 6.6 g/dL (ref 6.1–8.1)
eGFR: 53 mL/min/1.73m2 — ABNORMAL LOW (ref >=60)

## 2024-06-13 LAB — C-REACTIVE PROTEIN: CRP: 27.2 mg/L — ABNORMAL HIGH (ref <8.0)

## 2024-06-13 LAB — SEDIMENTATION RATE: Sed Rate: 55 mm/h — ABNORMAL HIGH (ref 0–30)

## 2024-06-13 NOTE — Telephone Encounter (Signed)
 Received voicemail from [employer] with [facility] wanting to clarify Rx for amoxicillin . They received Rx for amoxicillin  500 mg TID. Patient has previously been on 1000 mg TID.  If patient should be on 1000 mg TID, a new Rx will need to be sent with a note to pharmacy to d/c previous Rx.   Will route to provider.   Maddi Collar, BSN, RN

## 2024-06-15 ENCOUNTER — Ambulatory Visit: Payer: Self-pay | Admitting: Infectious Diseases

## 2024-06-18 ENCOUNTER — Inpatient Hospital Stay: Admitting: Oncology

## 2024-06-18 ENCOUNTER — Telehealth: Payer: Self-pay

## 2024-06-18 ENCOUNTER — Telehealth: Payer: Self-pay | Admitting: Oncology

## 2024-06-18 ENCOUNTER — Inpatient Hospital Stay

## 2024-06-18 NOTE — Telephone Encounter (Signed)
 Followed up with patient per phone call due to missed Oncology/Hematology and Lab appointments with Dr. Autumn. Patient reported that she had notified someone that she could not make her appointment due to feeling unwell.

## 2024-06-18 NOTE — Telephone Encounter (Signed)
 I spoke with patient regarding rescheduled appointments. Patient aware of date/time.

## 2024-06-26 ENCOUNTER — Ambulatory Visit: Admitting: Pulmonary Disease

## 2024-07-02 ENCOUNTER — Encounter: Payer: Self-pay | Admitting: Infectious Diseases

## 2024-07-02 ENCOUNTER — Ambulatory Visit: Admitting: Infectious Diseases

## 2024-07-02 ENCOUNTER — Other Ambulatory Visit: Payer: Self-pay

## 2024-07-02 VITALS — BP 99/61 | HR 74 | Temp 97.6°F

## 2024-07-02 DIAGNOSIS — N189 Chronic kidney disease, unspecified: Secondary | ICD-10-CM | POA: Diagnosis not present

## 2024-07-02 DIAGNOSIS — G6289 Other specified polyneuropathies: Secondary | ICD-10-CM

## 2024-07-02 DIAGNOSIS — Z79899 Other long term (current) drug therapy: Secondary | ICD-10-CM

## 2024-07-02 DIAGNOSIS — T8453XD Infection and inflammatory reaction due to internal right knee prosthesis, subsequent encounter: Secondary | ICD-10-CM

## 2024-07-02 DIAGNOSIS — Z5181 Encounter for therapeutic drug level monitoring: Secondary | ICD-10-CM | POA: Diagnosis not present

## 2024-07-02 DIAGNOSIS — M069 Rheumatoid arthritis, unspecified: Secondary | ICD-10-CM | POA: Diagnosis not present

## 2024-07-02 NOTE — Progress Notes (Signed)
 Patient Active Problem List   Diagnosis Date Noted   Medication monitoring encounter 06/12/2024   Immunization counseling 06/12/2024   Leg swelling 04/08/2024   Medication management 01/08/2024   Obesity, Class III, BMI 40-49.9 (morbid obesity) (HCC) 01/01/2024   Right displaced trimalleolar ankle fracture with syndesmosis disruption 12/26/2023   Fall at home, initial encounter 12/26/2023   History of rheumatoid arthritis 12/26/2023   Hypokalemia 12/26/2023   Itching 12/14/2023   Infection of prosthetic right knee joint 12/12/2023   IBS (irritable bowel syndrome) 11/02/2023   IDA (iron  deficiency anemia) 06/05/2023   Normocytic normochromic anemia 06/05/2023   Normocytic anemia 06/01/2023   Situational anxiety 10/15/2022   Hx of Pressure sore of left ischial area, stage IV- healed as of February 2025 09/10/2022   DNR (do not resuscitate) 06/21/2022   Agatston coronary artery calcium  score greater than 400 05/25/2022   Laryngopharyngeal reflux (LPR) 10/10/2021   Chronic maxillary sinusitis 10/10/2021   Prediabetes 02/14/2021   Chronic kidney disease, stage 3b (HCC) 08/01/2020   Post-menopausal 09/02/2019   Essential hypertension 08/20/2018   Recurrent urticaria 05/20/2018   Vitamin D  deficiency 11/20/2017   Hyperlipidemia 11/20/2017   Raynaud's disease without gangrene 03/12/2017   Eosinophilic esophagitis 10/12/2016   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   History of IBS 09/03/2016   Autoimmune disease 09/02/2016   Abnormality of gait 05/09/2016   Sjogren syndrome 02/29/2016   GERD (gastroesophageal reflux disease) 01/19/2016   Glucose intolerance (impaired glucose tolerance) 01/19/2016   Chronic diastolic CHF (congestive heart failure) (HCC) 04/20/2015   Peripheral neuropathy 02/02/2014   Class 2 obesity due to excess calories with body mass index (BMI) of 36.0 to 36.9 in adult 11/17/2013   Heart murmur    OSA (obstructive sleep apnea)    Rheumatoid arthritis  (HCC)    Hiatal hernia    Fibromyalgia    History of PVC (premature ventricular contraction)    Unilateral primary osteoarthritis, left knee 06/22/2011   Current Outpatient Medications on File Prior to Visit  Medication Sig Dispense Refill   amoxicillin  (AMOXIL ) 500 MG capsule Take 1 capsule (500 mg total) by mouth 3 (three) times daily. 90 capsule 5   aspirin  EC 81 MG tablet Take 1 tablet (81 mg total) by mouth daily. Swallow whole. 30 tablet 12   cetirizine  (ZYRTEC ) 10 MG tablet TAKE 1 TABLET (10 MG TOTAL) BY MOUTH DAILY. 90 tablet 1   colestipol  (COLESTID ) 1 g tablet Take 1-2 g by mouth daily as needed (ibs flare up).     cycloSPORINE  (RESTASIS ) 0.05 % ophthalmic emulsion Place 2 drops into both eyes 2 (two) times daily as needed (dry eyes).     diphenoxylate -atropine  (LOMOTIL ) 2.5-0.025 MG tablet as needed.     DULoxetine  (CYMBALTA ) 30 MG capsule Take 1 capsule (30 mg total) by mouth daily. 30 capsule 11   fenofibrate  (TRICOR ) 48 MG tablet Take 1 tablet (48 mg total) by mouth daily. 90 tablet 1   ferrous sulfate  325 (65 FE) MG tablet Take 1 tablet (325 mg total) by mouth every other day. 14 tablet 0   folic acid  (FOLVITE ) 1 MG tablet Take 1 tablet (1 mg total) by mouth daily. 30 tablet 5   gabapentin  (NEURONTIN ) 300 MG capsule Take 2 capsules (600 mg total) by mouth 2 (two) times daily. 360 capsule 3   hydroxychloroquine  (PLAQUENIL ) 200 MG tablet Take 1 tablet (200 mg total) by mouth daily. 90 tablet 0   hyoscyamine  (ANASPAZ ) 0.125 MG  TBDP disintergrating tablet Place 0.125 mg under the tongue every 6 (six) hours as needed for cramping.     ibuprofen  (ADVIL ) 800 MG tablet as needed.     LORazepam  (ATIVAN ) 0.5 MG tablet Take 1 tablet (0.5 mg total) by mouth at bedtime as needed for sleep. At bedtime prn 3 tablet 0   methocarbamol  (ROBAXIN ) 500 MG tablet Take 1 tablet (500 mg total) by mouth every 6 (six) hours as needed for muscle spasms. 40 tablet 0   metoprolol  succinate (TOPROL -XL) 25  MG 24 hr tablet TAKE 1/2 TABLET BY MOUTH (12.5 MG TOTAL) DAILY 45 tablet 3   Multiple Vitamin (MULTIVITAMIN WITH MINERALS) TABS tablet Take 1 tablet by mouth daily.     NONFORMULARY OR COMPOUNDED ITEM Apply 1 application  topically daily as needed (irritation). Triamcinolone  0.1% & Silvadene cream 1:1- Apply as directed to affected areas as needed     nystatin  cream (MYCOSTATIN ) Apply 1 Application topically daily. (Patient taking differently: Apply 1 Application topically as needed.)     omeprazole (PRILOSEC) 40 MG capsule Take 40 mg by mouth daily as needed (acid reflux).     ondansetron  (ZOFRAN ) 4 MG tablet Take 1 tablet (4 mg total) by mouth every 6 (six) hours as needed for nausea. 20 tablet 0   oxyCODONE  (OXY IR/ROXICODONE ) 5 MG immediate release tablet Take 1 tablet (5 mg total) by mouth every 6 (six) hours as needed for up to 5 days for severe pain (pain score 7-10) or breakthrough pain.     polyethylene glycol (MIRALAX ) 17 g packet Take 17 g by mouth daily as needed for mild constipation.     potassium chloride  (KLOR-CON ) 10 MEQ tablet Take 2 tablets (20 mEq total) by mouth daily. 180 tablet 3   promethazine  (PHENERGAN ) 25 MG tablet as needed.     rosuvastatin  (CRESTOR ) 10 MG tablet Take 1 tablet (10 mg total) by mouth daily. 90 tablet 1   spironolactone  (ALDACTONE ) 25 MG tablet TAKE 1 TABLET (25 MG TOTAL) BY MOUTH DAILY. 90 tablet 0   torsemide  40 MG TABS Take 40 mg by mouth daily. 30 tablet 0   No current facility-administered medications on file prior to visit.    Subjective: 73 year old female with prior history of basal cell carcinoma, CHF, CKD, DDD, GERD, Sjogren's syndrome, RA not on tx, Raynaud's phenomenon, peripheral neuropathy, fibromyalgia, HTN, HLD, obesity/OSA, OA, prediabetes, right knee arthroplasty, prolonged healing of sacral decub infection (managed with initial antibiotics and wound care) admitted who is here for HFU ( 4/30-5/3) for rt knee PJI.   She got re-admitted  5/14-5/21 after a fall leading to rt ankle #. She underwent ORIF on 5/17. She was seen by Ortho for her rt knee while admitted and was told her wound healing as expected.   5/27 - Rt knee wound has healed. Has a fu with Ortho for rt ankle Thursday and for Rt knee with Dr Hiram in June. No other concerns.   Completed IV ampicillin  on 6/12 after which PICC removed and started taking Amoxicillin  1g po tid without missing doses or any concerns.   10/30 - Reports she does not need to fu with her surgeons anymore. Started on plaquenil  by Rheumatology. Feels like neuropathic pain in rt foot. She has been started on cymbalta . She is worried about infection in her knee may flare if stopped abtx.  11/19 She has been taking Amoxicillin  500mg  three times daily with no concerns or missed doses. She reports being followed by Neurology  for neuropathic pain in the legs and wants to check with Neurology if any adjustment in gabapentin  needed. No concerns in rt knee and uses maual Wheelchair at home. Discussed about potentially stopping PO antibiotics suppression after a year total but she is worried about infection flaring and wants to continue until April 2026 and reconsider. No concerns otherwise.   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  07/17/2017   Fibromyalgia    GERD (gastroesophageal reflux disease)    Heart  murmur    as a child   Hiatal hernia    sjorgens syndrome   High blood pressure    High cholesterol    IBS (irritable bowel syndrome)    OSA (obstructive sleep apnea)    Osteoarthritis    Peripheral autonomic neuropathy of unknown cause    Pneumonia    Pre-diabetes    PVC (premature ventricular contraction)    Raynaud disease    Rheumatoid arthritis (HCC)    Sjogren's disease    Urticaria    Vitamin D  deficiency    Past Surgical History:  Procedure Laterality Date   ABDOMINAL HYSTERECTOMY     BTL, BSO   ADENOIDECTOMY     BREAST EXCISIONAL BIOPSY Left    BREAST SURGERY     mass removal    CHOLECYSTECTOMY     dental implants     DIAGNOSTIC LAPAROSCOPY     x3   FEMUR FRACTURE SURGERY Right    I & D KNEE WITH POLY EXCHANGE Right 12/12/2023   Procedure: IRRIGATION AND DEBRIDEMENT KNEE WITH POLY EXCHANGE;  Surgeon: Melodi Lerner, MD;  Location: [facility];  Service: Orthopedics;  Laterality: Right;   KNEE ARTHROSCOPY     x2   MASS EXCISION Left 03/22/2017   Procedure: EXCISION OF LEFT BREAST MASS;  Surgeon: Belinda Cough, MD;  Location: [facility];  Service: General;  Laterality: Left;   MULTIPLE TOOTH EXTRACTIONS  06/2023   x5   ORIF ANKLE FRACTURE Right 12/29/2023   Procedure: OPEN REDUCTION INTERNAL FIXATION (ORIF) ANKLE FRACTURE;  Surgeon: Barton Drape, MD;  Location: [facility];  Service: Orthopedics;  Laterality: Right;   ORIF FEMUR FRACTURE Right 06/22/2022   Procedure: RIGHT OPEN REDUCTION INTERNAL FIXATION (ORIF) DISTAL FEMUR FRACTURE;  Surgeon: Celena Sharper, MD;  Location: [facility];  Service: Orthopedics;  Laterality: Right;   patotid cystectomy     RIGHT/LEFT HEART CATH AND CORONARY ANGIOGRAPHY N/A 07/25/2019   Procedure: RIGHT/LEFT HEART CATH AND CORONARY ANGIOGRAPHY;  Surgeon: Cherrie Toribio SAUNDERS, MD;  Location: [facility];  Service: Cardiovascular;  Laterality: N/A;   TENDON REPAIR  1980   left ankle and tibia   TONSILLECTOMY     TOTAL KNEE ARTHROPLASTY   06/21/2011   Procedure: TOTAL KNEE ARTHROPLASTY;  Surgeon: Dempsey GAILS Aluisio;  Location: [facility];  Service: Orthopedics;  Laterality: Right;   TUBAL LIGATION      Social History   Tobacco Use   Smoking status: Never    Passive exposure: Past   Smokeless tobacco: Never  Vaping Use   Vaping status: Never Used  Substance Use Topics   Alcohol  use: Yes    Comment: occ   Drug use: No    Family History  Problem Relation Age of Onset   Alzheimer's disease Mother    Heart attack Mother    Hypertension Mother    Glaucoma Mother    High Cholesterol Mother    Heart disease Mother    Depression Mother    Cancer Father    High Cholesterol Father    Heart disease Father    Sleep apnea Father    Heart attack Brother    Heart disease Brother    Glaucoma Brother    Hyperlipidemia Brother    Glaucoma Brother    Breast cancer Maternal Aunt    Asthma Son    Allergic rhinitis Neg Hx    Angioedema Neg Hx    Eczema Neg Hx    Urticaria Neg Hx    Neuropathy Neg Hx     Allergies  Allergen Reactions   Sulfa Antibiotics Hives and Itching    Flushing   Cymbalta  [Duloxetine  Hcl] Other (See Comments)    Manic reaction   Demerol Hives and Other (See Comments)    Fever    Ivp Dye [Iodinated Contrast Media] Hives and Other (See Comments)    Fever   Nsaids Other (See Comments)    CKD stage 3b - GFR 41   Adhesive [Tape] Rash    Health Maintenance  Topic Date Due   Colonoscopy  11/21/2022   COVID-19 Vaccine (9 - Pfizer risk 2025-26 season) 11/03/2024   Medicare Annual Wellness (AWV)  05/29/2025   Mammogram  11/05/2025   DTaP/Tdap/Td (3 - Td or Tdap) 03/21/2028   Pneumococcal Vaccine: 50+ Years  Completed   Influenza Vaccine  Completed   Bone Density Scan  Completed   Hepatitis C Screening  Completed   Zoster Vaccines- Shingrix   Completed   Meningococcal B Vaccine  Aged Out    Objective: BP 99/61   Pulse 74   Temp 97.6 F (36.4 C) (Temporal)   SpO2 98%    Physical  Exam Constitutional:      Appearance: Normal appearance.  HENT:     Head: Normocephalic and atraumatic.      Mouth: Mucous membranes are moist.  Eyes:    Conjunctiva/sclera: Conjunctivae normal.     Pupils: Pupils are equal, round, and b/l symmetrical    Cardiovascular:     Rate and Rhythm: Normal rate    Heart sounds:   Pulmonary:     Effort: Pulmonary effort is normal.     Breath sounds:   Abdominal:     General: Non distended     Palpations:   Musculoskeletal:        General: sitting in the wheelchair,  Rt knee surgical site has healed.  Skin:    General: Skin is warm and dry.     Comments: no rashes   Neurological:     General: grossly non focal     Mental Status: awake, alert and oriented to person, place, and time.   Psychiatric:        Mood and Affect: Mood normal.   Lab Results Lab  Results  Component Value Date   WBC 7.4 06/12/2024   HGB 13.2 06/12/2024   HCT 39.7 06/12/2024   MCV 88.2 06/12/2024   PLT 260 06/12/2024    Lab Results  Component Value Date   CREATININE 1.10 (H) 06/12/2024   BUN 22 06/12/2024   NA 138 06/12/2024   K 4.1 06/12/2024   CL 102 06/12/2024   CO2 25 06/12/2024    Lab Results  Component Value Date   ALT 10 06/12/2024   AST 13 06/12/2024   ALKPHOS 99 02/01/2024   BILITOT 0.4 06/12/2024    Lab Results  Component Value Date   CHOL 120 10/23/2023   HDL 60 10/23/2023   LDLCALC 38 10/23/2023   TRIG 125 10/23/2023   CHOLHDL 2.0 10/23/2023   No results found for: LABRPR, RPRTITER No results found for: HIV1RNAQUANT, HIV1RNAVL, CD4TABS   Assessment/Plan # Right knee PJI - 3/28 aspiration in Ortho clinic with E faecalis - 4/30 I&D with liner exchange.  Or culture with E faecalis - No antibiotics prior to I&D - s/ 6 weeks of IV ampicillin  through 6/12,then switch PO amoxicillin  1g po tid starting 6/13 , switched to PO amoxicillin  500mg  po tid since 10/30 - Adherence assessed, side effects reviewed/discussed and  DDIs reviewed   Plan - will continue PO amoxicillin  500mg  po tid through end of April 2026 and discuss about trialing off antibiotics - ESR and CRP today. Last CRP down to 27.2 and ESR at 55  - fu in 6 weeks   # CKD  - 10/30 Cr better at 1.1  # RA  - on plaquenil   - Fu with Rheumatology   # Peripheral neuropathy - managed on duloxetine  and gabapentin   I spent 20 minutes involved in face-to-face and non-face-to-face activities for this patient on the day of the visit. Professional time spent includes the following activities: Preparing to see the patient (review of tests), Performing a medically appropriate examination and evaluation, Ordering labs, Documenting clinical information in the EMR, Independently interpreting results (not separately reported), Communicating results to the patient, Counseling and educating the patient.   Of note, portions of this note may have been created with voice recognition software. While this note has been edited for accuracy, occasional wrong-word or 'sound-a-like' substitutions may have occurred due to the inherent limitations of voice recognition software.   Annalee Joseph, MD [facility] 07/02/2024, 2:36 PM

## 2024-07-03 ENCOUNTER — Ambulatory Visit: Admitting: Infectious Diseases

## 2024-07-03 LAB — SEDIMENTATION RATE: Sed Rate: 48 mm/h — ABNORMAL HIGH (ref 0–30)

## 2024-07-03 LAB — C-REACTIVE PROTEIN: CRP: 12 mg/L — ABNORMAL HIGH (ref <8.0)

## 2024-07-07 ENCOUNTER — Ambulatory Visit: Payer: Self-pay | Admitting: Infectious Diseases

## 2024-07-09 ENCOUNTER — Ambulatory Visit: Admitting: Infectious Diseases

## 2024-07-24 ENCOUNTER — Telehealth: Payer: Self-pay | Admitting: Oncology

## 2024-07-24 NOTE — Telephone Encounter (Signed)
 I spoke with patient as she called in to reschedule appointments from 07/25/2024 to 1/16/20225 for lab and MD. Patient is aware of date/time change.

## 2024-07-25 ENCOUNTER — Inpatient Hospital Stay

## 2024-07-25 ENCOUNTER — Inpatient Hospital Stay: Admitting: Oncology

## 2024-08-05 NOTE — Progress Notes (Deleted)
 "  Office Visit Note  Patient: [name]             Date of Birth: 1951/01/12           MRN: [id number]             PCP: Chandra Toribio POUR, MD Referring: Chandra Toribio POUR, MD Visit Date: 08/19/2024 Occupation: disabled  Subjective:  No chief complaint on file.   History of Present Illness: [name] is a 73 y.o. female ***   History of total knee replacement, right: Under close care of Dr. Melodi.  Underwent aspiration and synovial analysis on 11/09/2023: positive for E faecalis.  Underwent irrigation and debridement with poly exchange on 12/12/2023 by Dr. Melodi.  Received IV ampicillin  01/24/2024 then switched to p.o. ampicillin  1 g 3 times daily on 01/15/2024.  Patient now remains on amoxicillin  and will be following back up with ID in 1 month.  Currently working with PT and OT. Using a wheelchair primarily but uses a walker if assisted by PT.   She has been off of Plaquenil  since April 2025  Activities of Daily Living:  Patient reports morning stiffness for *** {minute/hour:19697}.   Patient {ACTIONS;DENIES/REPORTS:21021675::Denies} nocturnal pain.  Difficulty dressing/grooming: {ACTIONS;DENIES/REPORTS:21021675::Denies} Difficulty climbing stairs: {ACTIONS;DENIES/REPORTS:21021675::Denies} Difficulty getting out of chair: {ACTIONS;DENIES/REPORTS:21021675::Denies} Difficulty using hands for taps, buttons, cutlery, and/or writing: {ACTIONS;DENIES/REPORTS:21021675::Denies}  No Rheumatology ROS completed.   PMFS History:  Patient Active Problem List   Diagnosis Date Noted   Medication monitoring encounter 06/12/2024   Immunization counseling 06/12/2024   Leg swelling 04/08/2024   Medication management 01/08/2024   Obesity, Class III, BMI 40-49.9 (morbid obesity) (HCC) 01/01/2024   Right displaced trimalleolar ankle fracture with syndesmosis disruption 12/26/2023   Fall at home, initial encounter 12/26/2023   History of rheumatoid arthritis 12/26/2023   Hypokalemia  12/26/2023   Itching 12/14/2023   Infection of prosthetic right knee joint 12/12/2023   IBS (irritable bowel syndrome) 11/02/2023   IDA (iron  deficiency anemia) 06/05/2023   Normocytic normochromic anemia 06/05/2023   Normocytic anemia 06/01/2023   Situational anxiety 10/15/2022   Hx of Pressure sore of left ischial area, stage IV- healed as of February 2025 09/10/2022   DNR (do not resuscitate) 06/21/2022   Agatston coronary artery calcium  score greater than 400 05/25/2022   Laryngopharyngeal reflux (LPR) 10/10/2021   Chronic maxillary sinusitis 10/10/2021   Prediabetes 02/14/2021   Chronic kidney disease, stage 3b (HCC) 08/01/2020   Post-menopausal 09/02/2019   Essential hypertension 08/20/2018   Recurrent urticaria 05/20/2018   Vitamin D  deficiency 11/20/2017   Hyperlipidemia 11/20/2017   Raynaud's disease without gangrene 03/12/2017   Eosinophilic esophagitis 10/12/2016   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   History of IBS 09/03/2016   Autoimmune disease 09/02/2016   Abnormality of gait 05/09/2016   Sjogren syndrome 02/29/2016   GERD (gastroesophageal reflux disease) 01/19/2016   Glucose intolerance (impaired glucose tolerance) 01/19/2016   Chronic diastolic CHF (congestive heart failure) (HCC) 04/20/2015   Peripheral neuropathy 02/02/2014   Class 2 obesity due to excess calories with body mass index (BMI) of 36.0 to 36.9 in adult 11/17/2013   Heart murmur    OSA (obstructive sleep apnea)    Rheumatoid arthritis (HCC)    Hiatal hernia    Fibromyalgia    History of PVC (premature ventricular contraction)    Unilateral primary osteoarthritis, left knee 06/22/2011    Past Medical History:  Diagnosis Date   Agatston coronary artery calcium  score greater than 400 05/2022  coronary Ca score 856   Anemia    Back pain    Blood transfusion    1981   Cancer (HCC)    basal cell carcinoma, x 3   on leg and bottom of spine   Chronic diastolic CHF (congestive heart  failure) (HCC)    diastolic    Chronic kidney disease    CKD3a   COVID    DDD (degenerative disc disease), cervical    DDD (degenerative disc disease), lumbar    Family history of coronary arteriosclerosis- strong fam h/o CAD and early CAD.  07/17/2017   Fibromyalgia    GERD (gastroesophageal reflux disease)    Heart murmur    as a child   Hiatal hernia    sjorgens syndrome   High blood pressure    High cholesterol    IBS (irritable bowel syndrome)    OSA (obstructive sleep apnea)    Osteoarthritis    Peripheral autonomic neuropathy of unknown cause    Pneumonia    Pre-diabetes    PVC (premature ventricular contraction)    Raynaud disease    Rheumatoid arthritis (HCC)    Sjogren's disease    Urticaria    Vitamin D  deficiency     Family History  Problem Relation Age of Onset   Alzheimer's disease Mother    Heart attack Mother    Hypertension Mother    Glaucoma Mother    High Cholesterol Mother    Heart disease Mother    Depression Mother    Cancer Father    High Cholesterol Father    Heart disease Father    Sleep apnea Father    Heart attack Brother    Heart disease Brother    Glaucoma Brother    Hyperlipidemia Brother    Glaucoma Brother    Breast cancer Maternal Aunt    Asthma Son    Allergic rhinitis Neg Hx    Angioedema Neg Hx    Eczema Neg Hx    Urticaria Neg Hx    Neuropathy Neg Hx    Past Surgical History:  Procedure Laterality Date   ABDOMINAL HYSTERECTOMY     BTL, BSO   ADENOIDECTOMY     BREAST EXCISIONAL BIOPSY Left    BREAST SURGERY     mass removal    CHOLECYSTECTOMY     dental implants     DIAGNOSTIC LAPAROSCOPY     x3   FEMUR FRACTURE SURGERY Right    I & D KNEE WITH POLY EXCHANGE Right 12/12/2023   Procedure: IRRIGATION AND DEBRIDEMENT KNEE WITH POLY EXCHANGE;  Surgeon: Melodi Lerner, MD;  Location: [facility];  Service: Orthopedics;  Laterality: Right;   KNEE ARTHROSCOPY     x2   MASS EXCISION Left 03/22/2017   Procedure: EXCISION OF  LEFT BREAST MASS;  Surgeon: Belinda Cough, MD;  Location: [facility];  Service: General;  Laterality: Left;   MULTIPLE TOOTH EXTRACTIONS  06/2023   x5   ORIF ANKLE FRACTURE Right 12/29/2023   Procedure: OPEN REDUCTION INTERNAL FIXATION (ORIF) ANKLE FRACTURE;  Surgeon: Barton Drape, MD;  Location: [facility];  Service: Orthopedics;  Laterality: Right;   ORIF FEMUR FRACTURE Right 06/22/2022   Procedure: RIGHT OPEN REDUCTION INTERNAL FIXATION (ORIF) DISTAL FEMUR FRACTURE;  Surgeon: Celena Sharper, MD;  Location: [facility];  Service: Orthopedics;  Laterality: Right;   patotid cystectomy     RIGHT/LEFT HEART CATH AND CORONARY ANGIOGRAPHY N/A 07/25/2019   Procedure: RIGHT/LEFT HEART CATH AND CORONARY ANGIOGRAPHY;  Surgeon:  Bensimhon, Toribio SAUNDERS, MD;  Location: [facility];  Service: Cardiovascular;  Laterality: N/A;   TENDON REPAIR  1980   left ankle and tibia   TONSILLECTOMY     TOTAL KNEE ARTHROPLASTY  06/21/2011   Procedure: TOTAL KNEE ARTHROPLASTY;  Surgeon: Dempsey LULLA Moan;  Location: [facility];  Service: Orthopedics;  Laterality: Right;   TUBAL LIGATION     Social History[1] Social History   Social History Narrative   Lives at home alone   Right-handed   Drinks 1 or less cups of coffee and 2 servings of either tea or soda per day     Immunization History  Administered Date(s) Administered   Fluad Quad(high Dose 65+) 05/05/2021   INFLUENZA, HIGH DOSE SEASONAL PF 06/12/2018, 05/17/2019, 05/06/2024   Influenza,inj,Quad PF,6+ Mos 05/23/2016, 06/01/2017   Influenza-Unspecified 06/05/2018, 05/17/2019, 06/07/2020, 05/18/2022, 07/06/2023   PFIZER(Purple Top)SARS-COV-2 Vaccination 09/03/2019, 09/24/2019, 04/01/2020, 10/04/2020   PNEUMOCOCCAL CONJUGATE-20 07/30/2023   Pfizer Covid-19 Vaccine Bivalent Booster 61yrs & up 05/05/2021, 05/18/2022   Pfizer(Comirnaty)Fall Seasonal Vaccine 12 years and older 05/06/2024   Pneumococcal Conjugate-13 07/02/2017   Pneumococcal Polysaccharide-23 05/17/2005    Respiratory Syncytial Virus Vaccine,Recomb Aduvanted(Arexvy) 05/18/2022   Td 10/16/2007   Tdap 03/21/2018   Unspecified SARS-COV-2 Vaccination 07/06/2023   Zoster Recombinant(Shingrix ) 06/10/2019, 12/11/2019     Objective: Vital Signs: There were no vitals taken for this visit.   Physical Exam   Musculoskeletal Exam: ***  CDAI Exam: CDAI Score: -- Patient Global: --; Provider Global: -- Swollen: --; Tender: -- Joint Exam 08/19/2024   No joint exam has been documented for this visit   There is currently no information documented on the homunculus. Go to the Rheumatology activity and complete the homunculus joint exam.  Investigation: No additional findings.  Imaging: No results found.  Recent Labs: Lab Results  Component Value Date   WBC 7.4 06/12/2024   HGB 13.2 06/12/2024   PLT 260 06/12/2024   NA 138 06/12/2024   K 4.1 06/12/2024   CL 102 06/12/2024   CO2 25 06/12/2024   GLUCOSE 119 (H) 06/12/2024   BUN 22 06/12/2024   CREATININE 1.10 (H) 06/12/2024   BILITOT 0.4 06/12/2024   ALKPHOS 99 02/01/2024   AST 13 06/12/2024   ALT 10 06/12/2024   PROT 6.6 06/12/2024   ALBUMIN 4.0 02/01/2024   CALCIUM  9.3 06/12/2024   GFRAA 58 (L) 09/23/2020    Speciality Comments: PLQ Eye Exam:  03/17/2024 WNL  @ [facility] [facility] f/u in 1 year  Procedures:  No procedures performed Allergies: Sulfa antibiotics, Cymbalta  [duloxetine  hcl], Demerol, Ivp dye [iodinated contrast media], Nsaids, and Adhesive [tape]   Assessment / Plan:     Visit Diagnoses: Rheumatoid arthritis involving multiple sites with positive rheumatoid factor (HCC)  High risk medication use  Sicca syndrome  Raynaud's disease without gangrene  History of total knee replacement, right  History of fracture of right ankle  Unilateral primary osteoarthritis, left knee  Degeneration of intervertebral disc of lumbar region without discogenic back pain or lower extremity pain  Pressure injury  of right buttock, stage 4 (HCC)  Pressure injury of deep tissue of right heel  Fibromyalgia  Chronic SI joint pain  Age-related osteoporosis without current pathological fracture  Status post fracture of femur  History of hyperlipidemia  History of cardiac murmur  History of diabetes mellitus  History of peripheral neuropathy  CKD (chronic kidney disease) stage 4, GFR 15-29 ml/min (HCC)  Eosinophilic esophagitis  History of diverticulitis  History  of depression  Orders: No orders of the defined types were placed in this encounter.  No orders of the defined types were placed in this encounter.   Face-to-face time spent with patient was *** minutes. Greater than 50% of time was spent in counseling and coordination of care.  Follow-Up Instructions: No follow-ups on file.   Waddell CHRISTELLA Craze, PA-C  Note - This record has been created using Dragon software.  Chart creation errors have been sought, but may not always  have been located. Such creation errors do not reflect on  the standard of medical care.     [1]  Social History Tobacco Use   Smoking status: Never    Passive exposure: Past   Smokeless tobacco: Never  Vaping Use   Vaping status: Never Used  Substance Use Topics   Alcohol  use: Yes    Comment: occ   Drug use: No   "

## 2024-08-12 ENCOUNTER — Ambulatory Visit: Admitting: Infectious Diseases

## 2024-08-19 ENCOUNTER — Ambulatory Visit: Admitting: Physician Assistant

## 2024-08-19 DIAGNOSIS — Z8719 Personal history of other diseases of the digestive system: Secondary | ICD-10-CM

## 2024-08-19 DIAGNOSIS — M3500 Sicca syndrome, unspecified: Secondary | ICD-10-CM

## 2024-08-19 DIAGNOSIS — Z8781 Personal history of (healed) traumatic fracture: Secondary | ICD-10-CM

## 2024-08-19 DIAGNOSIS — M810 Age-related osteoporosis without current pathological fracture: Secondary | ICD-10-CM

## 2024-08-19 DIAGNOSIS — M0579 Rheumatoid arthritis with rheumatoid factor of multiple sites without organ or systems involvement: Secondary | ICD-10-CM

## 2024-08-19 DIAGNOSIS — Z8659 Personal history of other mental and behavioral disorders: Secondary | ICD-10-CM

## 2024-08-19 DIAGNOSIS — M51369 Other intervertebral disc degeneration, lumbar region without mention of lumbar back pain or lower extremity pain: Secondary | ICD-10-CM

## 2024-08-19 DIAGNOSIS — M1712 Unilateral primary osteoarthritis, left knee: Secondary | ICD-10-CM

## 2024-08-19 DIAGNOSIS — Z79899 Other long term (current) drug therapy: Secondary | ICD-10-CM

## 2024-08-19 DIAGNOSIS — Z8669 Personal history of other diseases of the nervous system and sense organs: Secondary | ICD-10-CM

## 2024-08-19 DIAGNOSIS — Z8639 Personal history of other endocrine, nutritional and metabolic disease: Secondary | ICD-10-CM

## 2024-08-19 DIAGNOSIS — N184 Chronic kidney disease, stage 4 (severe): Secondary | ICD-10-CM

## 2024-08-19 DIAGNOSIS — L89616 Pressure-induced deep tissue damage of right heel: Secondary | ICD-10-CM

## 2024-08-19 DIAGNOSIS — Z96651 Presence of right artificial knee joint: Secondary | ICD-10-CM

## 2024-08-19 DIAGNOSIS — I7300 Raynaud's syndrome without gangrene: Secondary | ICD-10-CM

## 2024-08-19 DIAGNOSIS — G8929 Other chronic pain: Secondary | ICD-10-CM

## 2024-08-19 DIAGNOSIS — M797 Fibromyalgia: Secondary | ICD-10-CM

## 2024-08-19 DIAGNOSIS — Z8679 Personal history of other diseases of the circulatory system: Secondary | ICD-10-CM

## 2024-08-19 DIAGNOSIS — K200 Eosinophilic esophagitis: Secondary | ICD-10-CM

## 2024-08-19 DIAGNOSIS — L89314 Pressure ulcer of right buttock, stage 4: Secondary | ICD-10-CM

## 2024-08-21 ENCOUNTER — Encounter: Payer: Self-pay | Admitting: Infectious Diseases

## 2024-08-21 ENCOUNTER — Ambulatory Visit: Admitting: Infectious Diseases

## 2024-08-21 ENCOUNTER — Other Ambulatory Visit: Payer: Self-pay

## 2024-08-21 VITALS — BP 112/71 | HR 62 | Temp 99.2°F | Resp 16

## 2024-08-21 DIAGNOSIS — G629 Polyneuropathy, unspecified: Secondary | ICD-10-CM

## 2024-08-21 DIAGNOSIS — T8453XD Infection and inflammatory reaction due to internal right knee prosthesis, subsequent encounter: Secondary | ICD-10-CM | POA: Diagnosis not present

## 2024-08-21 DIAGNOSIS — M0579 Rheumatoid arthritis with rheumatoid factor of multiple sites without organ or systems involvement: Secondary | ICD-10-CM

## 2024-08-21 DIAGNOSIS — M059 Rheumatoid arthritis with rheumatoid factor, unspecified: Secondary | ICD-10-CM

## 2024-08-21 DIAGNOSIS — N189 Chronic kidney disease, unspecified: Secondary | ICD-10-CM | POA: Diagnosis not present

## 2024-08-21 DIAGNOSIS — Z5181 Encounter for therapeutic drug level monitoring: Secondary | ICD-10-CM

## 2024-08-21 NOTE — Progress Notes (Signed)
 "     Patient Active Problem List   Diagnosis Date Noted   Medication monitoring encounter 06/12/2024   Immunization counseling 06/12/2024   Leg swelling 04/08/2024   Medication management 01/08/2024   Obesity, Class III, BMI 40-49.9 (morbid obesity) (HCC) 01/01/2024   Right displaced trimalleolar ankle fracture with syndesmosis disruption 12/26/2023   Fall at home, initial encounter 12/26/2023   History of rheumatoid arthritis 12/26/2023   Hypokalemia 12/26/2023   Itching 12/14/2023   Infection of prosthetic right knee joint 12/12/2023   IBS (irritable bowel syndrome) 11/02/2023   IDA (iron  deficiency anemia) 06/05/2023   Normocytic normochromic anemia 06/05/2023   Normocytic anemia 06/01/2023   Situational anxiety 10/15/2022   Hx of Pressure sore of left ischial area, stage IV- healed as of February 2025 09/10/2022   DNR (do not resuscitate) 06/21/2022   Agatston coronary artery calcium  score greater than 400 05/25/2022   Laryngopharyngeal reflux (LPR) 10/10/2021   Chronic maxillary sinusitis 10/10/2021   Prediabetes 02/14/2021   Chronic kidney disease, stage 3b (HCC) 08/01/2020   Post-menopausal 09/02/2019   Essential hypertension 08/20/2018   Recurrent urticaria 05/20/2018   Vitamin D  deficiency 11/20/2017   Hyperlipidemia 11/20/2017   Raynaud's disease without gangrene 03/12/2017   Eosinophilic esophagitis 10/12/2016   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   History of IBS 09/03/2016   Autoimmune disease 09/02/2016   Abnormality of gait 05/09/2016   Sjogren syndrome 02/29/2016   GERD (gastroesophageal reflux disease) 01/19/2016   Glucose intolerance (impaired glucose tolerance) 01/19/2016   Chronic diastolic CHF (congestive heart failure) (HCC) 04/20/2015   Peripheral neuropathy 02/02/2014   Class 2 obesity due to excess calories with body mass index (BMI) of 36.0 to 36.9 in adult 11/17/2013   Heart murmur    OSA (obstructive sleep apnea)    Rheumatoid arthritis  (HCC)    Hiatal hernia    Fibromyalgia    History of PVC (premature ventricular contraction)    Unilateral primary osteoarthritis, left knee 06/22/2011   Current Outpatient Medications on File Prior to Visit  Medication Sig Dispense Refill   amoxicillin  (AMOXIL ) 500 MG capsule Take 1 capsule (500 mg total) by mouth 3 (three) times daily. 90 capsule 5   aspirin  EC 81 MG tablet Take 1 tablet (81 mg total) by mouth daily. Swallow whole. 30 tablet 12   cetirizine  (ZYRTEC ) 10 MG tablet TAKE 1 TABLET (10 MG TOTAL) BY MOUTH DAILY. 90 tablet 1   colestipol  (COLESTID ) 1 g tablet Take 1-2 g by mouth daily as needed (ibs flare up).     cycloSPORINE  (RESTASIS ) 0.05 % ophthalmic emulsion Place 2 drops into both eyes 2 (two) times daily as needed (dry eyes).     diphenoxylate -atropine  (LOMOTIL ) 2.5-0.025 MG tablet as needed.     DULoxetine  (CYMBALTA ) 30 MG capsule Take 1 capsule (30 mg total) by mouth daily. 30 capsule 11   fenofibrate  (TRICOR ) 48 MG tablet Take 1 tablet (48 mg total) by mouth daily. 90 tablet 1   ferrous sulfate  325 (65 FE) MG tablet Take 1 tablet (325 mg total) by mouth every other day. 14 tablet 0   folic acid  (FOLVITE ) 1 MG tablet Take 1 tablet (1 mg total) by mouth daily. 30 tablet 5   gabapentin  (NEURONTIN ) 300 MG capsule Take 2 capsules (600 mg total) by mouth 2 (two) times daily. 360 capsule 3   hydroxychloroquine  (PLAQUENIL ) 200 MG tablet Take 1 tablet (200 mg total) by mouth daily. 90 tablet 0   hyoscyamine  (ANASPAZ ) 0.125  MG TBDP disintergrating tablet Place 0.125 mg under the tongue every 6 (six) hours as needed for cramping.     ibuprofen  (ADVIL ) 800 MG tablet as needed.     LORazepam  (ATIVAN ) 0.5 MG tablet Take 1 tablet (0.5 mg total) by mouth at bedtime as needed for sleep. At bedtime prn 3 tablet 0   methocarbamol  (ROBAXIN ) 500 MG tablet Take 1 tablet (500 mg total) by mouth every 6 (six) hours as needed for muscle spasms. 40 tablet 0   metoprolol  succinate (TOPROL -XL) 25  MG 24 hr tablet TAKE 1/2 TABLET BY MOUTH (12.5 MG TOTAL) DAILY 45 tablet 3   Multiple Vitamin (MULTIVITAMIN WITH MINERALS) TABS tablet Take 1 tablet by mouth daily.     NONFORMULARY OR COMPOUNDED ITEM Apply 1 application  topically daily as needed (irritation). Triamcinolone  0.1% & Silvadene cream 1:1- Apply as directed to affected areas as needed     nystatin  cream (MYCOSTATIN ) Apply 1 Application topically daily. (Patient taking differently: Apply 1 Application topically as needed.)     omeprazole (PRILOSEC) 40 MG capsule Take 40 mg by mouth daily as needed (acid reflux).     ondansetron  (ZOFRAN ) 4 MG tablet Take 1 tablet (4 mg total) by mouth every 6 (six) hours as needed for nausea. 20 tablet 0   oxyCODONE  (OXY IR/ROXICODONE ) 5 MG immediate release tablet Take 1 tablet (5 mg total) by mouth every 6 (six) hours as needed for up to 5 days for severe pain (pain score 7-10) or breakthrough pain.     polyethylene glycol (MIRALAX ) 17 g packet Take 17 g by mouth daily as needed for mild constipation.     potassium chloride  (KLOR-CON ) 10 MEQ tablet Take 2 tablets (20 mEq total) by mouth daily. 180 tablet 3   promethazine  (PHENERGAN ) 25 MG tablet as needed.     rosuvastatin  (CRESTOR ) 10 MG tablet Take 1 tablet (10 mg total) by mouth daily. 90 tablet 1   spironolactone  (ALDACTONE ) 25 MG tablet TAKE 1 TABLET (25 MG TOTAL) BY MOUTH DAILY. 90 tablet 0   torsemide  40 MG TABS Take 40 mg by mouth daily. 30 tablet 0   No current facility-administered medications on file prior to visit.    Subjective: 74 year old female with prior history of basal cell carcinoma, CHF, CKD, DDD, GERD, Sjogren's syndrome, RA not on tx, Raynaud's phenomenon, peripheral neuropathy, fibromyalgia, HTN, HLD, obesity/OSA, OA, prediabetes, right knee arthroplasty, prolonged healing of sacral decub infection (managed with initial antibiotics and wound care) admitted who is here for HFU ( 4/30-5/3) for rt knee PJI.   She got re-admitted  5/14-5/21 after a fall leading to rt ankle #. She underwent ORIF on 5/17. She was seen by Ortho for her rt knee while admitted and was told her wound healing as expected.   5/27 - Rt knee wound has healed. Has a fu with Ortho for rt ankle Thursday and for Rt knee with Dr Hiram in June. No other concerns.   Completed IV ampicillin  on 6/12 after which PICC removed and started taking Amoxicillin  1g po tid without missing doses or any concerns.   10/30 - Reports she does not need to fu with her surgeons anymore. Started on plaquenil  by Rheumatology. Feels like neuropathic pain in rt foot. She has been started on cymbalta . She is worried about infection in her knee may flare if stopped abtx.  11/19 She has been taking Amoxicillin  500mg  three times daily with no concerns or missed doses. She reports being followed by  Neurology for neuropathic pain in the legs and wants to check with Neurology if any adjustment in gabapentin  needed. No concerns in rt knee and uses maual Wheelchair at home. Discussed about potentially stopping PO antibiotics suppression after a year total but she is worried about infection flaring and wants to continue until April 2026 and reconsider. No concerns otherwise.   1/8 Complaint with PO amoxicillin  with no missed doses or concerns. Had some dry cough few days ago but no fevers or sob or other symptoms. On and off swelling in rt knee but no other symptoms concerning for infection.  Review of Systems: all systems reviewed with pertinent positives and negatives as listed above   Past Medical History:  Diagnosis Date   Agatston coronary artery calcium  score greater than 400 05/2022   coronary Ca score 856   Anemia    Back pain    Blood transfusion    1981   Cancer (HCC)    basal cell carcinoma, x 3   on leg and bottom of spine   Chronic diastolic CHF (congestive heart failure) (HCC)    diastolic    Chronic kidney disease    CKD3a   COVID    DDD (degenerative disc  disease), cervical    DDD (degenerative disc disease), lumbar    Family history of coronary arteriosclerosis- strong fam h/o CAD and early CAD.  07/17/2017   Fibromyalgia    GERD (gastroesophageal reflux disease)    Heart murmur    as a child   Hiatal hernia    sjorgens syndrome   High blood pressure    High cholesterol    IBS (irritable bowel syndrome)    OSA (obstructive sleep apnea)    Osteoarthritis    Peripheral autonomic neuropathy of unknown cause    Pneumonia    Pre-diabetes    PVC (premature ventricular contraction)    Raynaud disease    Rheumatoid arthritis (HCC)    Sjogren's disease    Urticaria    Vitamin D  deficiency    Past Surgical History:  Procedure Laterality Date   ABDOMINAL HYSTERECTOMY     BTL, BSO   ADENOIDECTOMY     BREAST EXCISIONAL BIOPSY Left    BREAST SURGERY     mass removal    CHOLECYSTECTOMY     dental implants     DIAGNOSTIC LAPAROSCOPY     x3   FEMUR FRACTURE SURGERY Right    I & D KNEE WITH POLY EXCHANGE Right 12/12/2023   Procedure: IRRIGATION AND DEBRIDEMENT KNEE WITH POLY EXCHANGE;  Surgeon: Melodi Lerner, MD;  Location: [facility];  Service: Orthopedics;  Laterality: Right;   KNEE ARTHROSCOPY     x2   MASS EXCISION Left 03/22/2017   Procedure: EXCISION OF LEFT BREAST MASS;  Surgeon: Belinda Cough, MD;  Location: [facility];  Service: General;  Laterality: Left;   MULTIPLE TOOTH EXTRACTIONS  06/2023   x5   ORIF ANKLE FRACTURE Right 12/29/2023   Procedure: OPEN REDUCTION INTERNAL FIXATION (ORIF) ANKLE FRACTURE;  Surgeon: Barton Drape, MD;  Location: [facility];  Service: Orthopedics;  Laterality: Right;   ORIF FEMUR FRACTURE Right 06/22/2022   Procedure: RIGHT OPEN REDUCTION INTERNAL FIXATION (ORIF) DISTAL FEMUR FRACTURE;  Surgeon: Celena Sharper, MD;  Location: [facility];  Service: Orthopedics;  Laterality: Right;   patotid cystectomy     RIGHT/LEFT HEART CATH AND CORONARY ANGIOGRAPHY N/A 07/25/2019   Procedure: RIGHT/LEFT HEART CATH AND  CORONARY ANGIOGRAPHY;  Surgeon: Cherrie Toribio SAUNDERS, MD;  Location: [facility];  Service: Cardiovascular;  Laterality: N/A;   TENDON REPAIR  1980   left ankle and tibia   TONSILLECTOMY     TOTAL KNEE ARTHROPLASTY  06/21/2011   Procedure: TOTAL KNEE ARTHROPLASTY;  Surgeon: Dempsey LULLA Moan;  Location: [facility];  Service: Orthopedics;  Laterality: Right;   TUBAL LIGATION      Social History   Tobacco Use   Smoking status: Never    Passive exposure: Past   Smokeless tobacco: Never  Vaping Use   Vaping status: Never Used  Substance Use Topics   Alcohol  use: Yes    Comment: occ   Drug use: No    Family History  Problem Relation Age of Onset   Alzheimer's disease Mother    Heart attack Mother    Hypertension Mother    Glaucoma Mother    High Cholesterol Mother    Heart disease Mother    Depression Mother    Cancer Father    High Cholesterol Father    Heart disease Father    Sleep apnea Father    Heart attack Brother    Heart disease Brother    Glaucoma Brother    Hyperlipidemia Brother    Glaucoma Brother    Breast cancer Maternal Aunt    Asthma Son    Allergic rhinitis Neg Hx    Angioedema Neg Hx    Eczema Neg Hx    Urticaria Neg Hx    Neuropathy Neg Hx     Allergies  Allergen Reactions   Sulfa Antibiotics Hives and Itching    Flushing   Cymbalta  [Duloxetine  Hcl] Other (See Comments)    Manic reaction   Demerol Hives and Other (See Comments)    Fever    Ivp Dye [Iodinated Contrast Media] Hives and Other (See Comments)    Fever   Nsaids Other (See Comments)    CKD stage 3b - GFR 41   Adhesive [Tape] Rash    Health Maintenance  Topic Date Due   Colonoscopy  11/21/2022   COVID-19 Vaccine (9 - Pfizer risk 2025-26 season) 11/03/2024   Medicare Annual Wellness (AWV)  05/29/2025   Mammogram  11/05/2025   DTaP/Tdap/Td (3 - Td or Tdap) 03/21/2028   Pneumococcal Vaccine: 50+ Years  Completed   Influenza Vaccine  Completed   Bone Density Scan  Completed    Hepatitis C Screening  Completed   Zoster Vaccines- Shingrix   Completed   Meningococcal B Vaccine  Aged Out    Objective: BP 112/71   Pulse 62   Temp 99.2 F (37.3 C) (Oral)   Resp 16   SpO2 96%   Physical Exam Constitutional:      Appearance: Normal appearance.  HENT:     Head: Normocephalic and atraumatic.      Mouth: Mucous membranes are moist.  Eyes:    Conjunctiva/sclera: Conjunctivae normal.     Pupils: Pupils are equal, round, and b/l symmetrical    Cardiovascular:     Rate and Rhythm: Normal rate    Heart sounds:   Pulmonary:     Effort: Pulmonary effort is normal.     Breath sounds:   Abdominal:     General: Non distended     Palpations:   Musculoskeletal:        General: sitting in the wheelchair,  Rt knee surgical site has healed. Mild swelling but no other signs concerning for infection like redness, warmth or tenderness   Skin:  General: Skin is warm and dry.     Comments: no rashes   Neurological:     General: grossly non focal     Mental Status: awake, alert  Psychiatric:        Mood and Affect: Mood normal.   Lab Results Lab Results  Component Value Date   WBC 7.4 06/12/2024   HGB 13.2 06/12/2024   HCT 39.7 06/12/2024   MCV 88.2 06/12/2024   PLT 260 06/12/2024    Lab Results  Component Value Date   CREATININE 1.10 (H) 06/12/2024   BUN 22 06/12/2024   NA 138 06/12/2024   K 4.1 06/12/2024   CL 102 06/12/2024   CO2 25 06/12/2024    Lab Results  Component Value Date   ALT 10 06/12/2024   AST 13 06/12/2024   ALKPHOS 99 02/01/2024   BILITOT 0.4 06/12/2024    Lab Results  Component Value Date   CHOL 120 10/23/2023   HDL 60 10/23/2023   LDLCALC 38 10/23/2023   TRIG 125 10/23/2023   CHOLHDL 2.0 10/23/2023   No results found for: LABRPR, RPRTITER No results found for: HIV1RNAQUANT, HIV1RNAVL, CD4TABS   Assessment/Plan # Right knee PJI - 3/28 aspiration in Ortho clinic with E faecalis - 4/30 I&D with liner  exchange.  Or culture with E faecalis - No antibiotics prior to I&D - s/ 6 weeks of IV ampicillin  through 6/12,then switch PO amoxicillin  1g po tid starting 6/13 , switched to PO amoxicillin  500mg  po tid since 10/30 - Adherence assessed, side effects reviewed/discussed and DDIs reviewed   Plan - continue PO amoxicillin  500mg  po tid through end of April 2026 and plan to trial off antibiotics. She is agreeable - labs today  - fu in 2 months   # CKD  - BMP today   # RA  - on plaquenil   - Fu with Rheumatology   # Peripheral neuropathy - managed on duloxetine  and gabapentin   I personally spent a total of 21 in the care of the patient today including preparing to see the patient, getting/reviewing separately obtained history, performing a medically appropriate exam/evaluation, counseling and educating, placing orders, documenting clinical information in the EHR, independently interpreting results, and communicating results.  Annalee Joseph, MD [facility] 08/21/2024, 11:17 AM  "

## 2024-08-22 LAB — BASIC METABOLIC PANEL WITH GFR
BUN: 19 mg/dL (ref 7–25)
CO2: 23 mmol/L (ref 20–32)
Calcium: 9 mg/dL (ref 8.6–10.4)
Chloride: 109 mmol/L (ref 98–110)
Creat: 0.74 mg/dL (ref 0.60–1.00)
Glucose, Bld: 125 mg/dL — ABNORMAL HIGH (ref 65–99)
Potassium: 4 mmol/L (ref 3.5–5.3)
Sodium: 141 mmol/L (ref 135–146)
eGFR: 86 mL/min/1.73m2

## 2024-08-22 LAB — CBC
HCT: 40.6 % (ref 35.9–46.0)
Hemoglobin: 12.8 g/dL (ref 11.7–15.5)
MCH: 27.4 pg (ref 27.0–33.0)
MCHC: 31.5 g/dL — ABNORMAL LOW (ref 31.6–35.4)
MCV: 86.8 fL (ref 81.4–101.7)
MPV: 12.1 fL (ref 7.5–12.5)
Platelets: 243 Thousand/uL (ref 140–400)
RBC: 4.68 Million/uL (ref 3.80–5.10)
RDW: 13.8 % (ref 11.0–15.0)
WBC: 6.2 Thousand/uL (ref 3.8–10.8)

## 2024-08-22 LAB — SEDIMENTATION RATE: Sed Rate: 51 mm/h — ABNORMAL HIGH (ref 0–30)

## 2024-08-22 LAB — C-REACTIVE PROTEIN: CRP: 35.1 mg/L — ABNORMAL HIGH

## 2024-08-23 ENCOUNTER — Ambulatory Visit: Payer: Self-pay | Admitting: Infectious Diseases

## 2024-08-26 ENCOUNTER — Ambulatory Visit: Admitting: Neurology

## 2024-08-29 ENCOUNTER — Encounter: Payer: Self-pay | Admitting: Oncology

## 2024-08-29 ENCOUNTER — Inpatient Hospital Stay: Admitting: Oncology

## 2024-08-29 ENCOUNTER — Inpatient Hospital Stay: Attending: Oncology

## 2024-08-29 VITALS — BP 138/52 | HR 76 | Temp 97.9°F | Resp 17 | Wt 215.3 lb

## 2024-08-29 DIAGNOSIS — D649 Anemia, unspecified: Secondary | ICD-10-CM

## 2024-08-29 DIAGNOSIS — M0579 Rheumatoid arthritis with rheumatoid factor of multiple sites without organ or systems involvement: Secondary | ICD-10-CM

## 2024-08-29 DIAGNOSIS — L89324 Pressure ulcer of left buttock, stage 4: Secondary | ICD-10-CM | POA: Diagnosis not present

## 2024-08-29 LAB — CBC WITH DIFFERENTIAL (CANCER CENTER ONLY)
Abs Immature Granulocytes: 0.05 K/uL (ref 0.00–0.07)
Basophils Absolute: 0.1 K/uL (ref 0.0–0.1)
Basophils Relative: 1 %
Eosinophils Absolute: 0.1 K/uL (ref 0.0–0.5)
Eosinophils Relative: 2 %
HCT: 37.2 % (ref 36.0–46.0)
Hemoglobin: 12.2 g/dL (ref 12.0–15.0)
Immature Granulocytes: 1 %
Lymphocytes Relative: 16 %
Lymphs Abs: 1 K/uL (ref 0.7–4.0)
MCH: 27.8 pg (ref 26.0–34.0)
MCHC: 32.8 g/dL (ref 30.0–36.0)
MCV: 84.7 fL (ref 80.0–100.0)
Monocytes Absolute: 0.5 K/uL (ref 0.1–1.0)
Monocytes Relative: 8 %
Neutro Abs: 4.6 K/uL (ref 1.7–7.7)
Neutrophils Relative %: 72 %
Platelet Count: 237 K/uL (ref 150–400)
RBC: 4.39 MIL/uL (ref 3.87–5.11)
RDW: 14.1 % (ref 11.5–15.5)
WBC Count: 6.3 K/uL (ref 4.0–10.5)
nRBC: 0 % (ref 0.0–0.2)

## 2024-08-29 LAB — CMP (CANCER CENTER ONLY)
ALT: 8 U/L (ref 0–44)
AST: 16 U/L (ref 15–41)
Albumin: 3.8 g/dL (ref 3.5–5.0)
Alkaline Phosphatase: 155 U/L — ABNORMAL HIGH (ref 38–126)
Anion gap: 13 (ref 5–15)
BUN: 19 mg/dL (ref 8–23)
CO2: 21 mmol/L — ABNORMAL LOW (ref 22–32)
Calcium: 9.2 mg/dL (ref 8.9–10.3)
Chloride: 106 mmol/L (ref 98–111)
Creatinine: 0.89 mg/dL (ref 0.44–1.00)
GFR, Estimated: >60 mL/min
Glucose, Bld: 126 mg/dL — ABNORMAL HIGH (ref 70–99)
Potassium: 4.2 mmol/L (ref 3.5–5.1)
Sodium: 139 mmol/L (ref 135–145)
Total Bilirubin: 0.4 mg/dL (ref 0.0–1.2)
Total Protein: 6.9 g/dL (ref 6.5–8.1)

## 2024-08-29 LAB — IRON AND IRON BINDING CAPACITY (CC-WL,HP ONLY)
Iron: 53 ug/dL (ref 28–170)
Saturation Ratios: 19 % (ref 10.4–31.8)
TIBC: 276 ug/dL (ref 250–450)
UIBC: 223 ug/dL

## 2024-08-29 LAB — FERRITIN: Ferritin: 136 ng/mL (ref 11–307)

## 2024-08-29 LAB — FOLATE: Folate: 9.2 ng/mL

## 2024-08-29 NOTE — Assessment & Plan Note (Addendum)
 Diagnosed in the 1990s, currently off all RA medications except for ibuprofen  due to concerns about wound healing.   Reports flare-ups with joint swelling and pain, particularly in wrists and fingers. She is back on Plaquenil  now.

## 2024-08-29 NOTE — Progress Notes (Signed)
 "  [facility]  HEMATOLOGY CLINIC PROGRESS NOTE  PATIENT NAME: [name]   MR#: [id number] DOB: [birth year]  Patient Care Team: Chandra Toribio POUR, MD as PCP - General (Family Medicine) Dolphus Reiter, MD as Consulting Physician (Rheumatology) Shlomo Wilbert SAUNDERS, MD as Consulting Physician (Cardiology) Melodi Lerner, MD as Consulting Physician (Orthopedic Surgery) Jenel Carlin POUR, MD (Inactive) as Consulting Physician (Neurology) Kristie Lamprey, MD as Consulting Physician (Gastroenterology) Jordan, Amy, MD as Consulting Physician (Dermatology) Prescilla Beams, MD as Consulting Physician (Nephrology) Bobbitt, Elgin Pepper, MD as Consulting Physician (Allergy  and Immunology) Caleen Dirks, MD as Consulting Physician (Internal Medicine)  Date of visit: 08/29/2024   ASSESSMENT & PLAN:   [name] is a 74 y.o. 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    Normocytic anemia New onset since November 2023, possibly secondary to surgery. No active bleeding noted. Possible contributing factors include chronic non-healing wound and rheumatoid arthritis. No signs of blood loss from GI tract or other sources. -On her initial visit with us  on 06/01/2023, labs showed hemoglobin of 9.5, hematocrit 31.5, MCV 83.1.  White count 6800 with normal differential.  Platelet count normal at 289,000.  CMP showed BUN of 40, creatinine 1.26, normal LFTs.  B12, LDH normal.  Folate low at 5.8.  Iron  saturation decreased at 9%.  Ferritin normal.  Workup for monoclonal gammopathy came back negative.  Started her on folic acid  1 mg daily.  Also given mild iron  deficiency as noted by decreased iron  saturation, she was treated with IV iron  using Venofer  x 5 doses.  She has maintained on folic acid  daily and tolerating this well. Iron  supplements  caused constipation and hence she stopped taking them.   Hemoglobin is currently 12.2, normal.  Normal MCV.  White count and platelet count are also within normal limits.  Iron  studies show no evidence of iron  deficiency, ferritin normal at 136. No indication for IV iron  currently. She was advised to continue folic acid  supplements.  Rheumatoid arthritis (HCC) Diagnosed in the 1990s, currently off all RA medications except for ibuprofen  due to concerns about wound healing.   Reports flare-ups with joint swelling and pain, particularly in wrists and fingers. She is back on Plaquenil  now.  Hx of Pressure sore of left ischial area, stage IV- healed as of February 2025 This is completely healed.  She is off of wound VAC.   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 Currently on oral amoxicillin  3000 mg daily after completing IV antibiotics via PICC line. Duration of antibiotic therapy may range from six months to life, depending on clinical response and laboratory markers. - Continue oral amoxicillin  3000 mg daily   I spent a total of 32 minutes during this encounter with the patient including review of chart and various tests results, discussions about plan of care and coordination of care plan.  I reviewed lab results and outside records for this visit and discussed relevant results with the patient. Diagnosis, plan of care and treatment options were also discussed in detail with the patient. Opportunity provided to ask questions and answers provided to her apparent satisfaction. Provided instructions to call our clinic with any problems, questions or concerns prior to return visit. I recommended to continue follow-up with PCP and sub-specialists. She verbalized understanding and agreed with the plan.  No barriers to learning was detected.  Chinita Patten, MD  08/29/2024 7:25 PM  [facility] - A DEPT  [facility] [address] [place] [address] Dept: [phone] Dept Fax: [phone]   CHIEF COMPLAINT/ REASON FOR VISIT:  Follow-up for normocytic anemia.  Previously noted to have iron  deficiency and folic acid  deficiency.  Treated with IV iron  in October-November 2024.  INTERVAL HISTORY:  Discussed the use of AI scribe software for clinical note transcription with the patient, who gave verbal consent to proceed.  History of Present Illness  VIRGILENE Cooper is a 74 year old female with chronic anemia who presents for hematology follow-up to monitor blood counts and anemia status.  She has undergone regular laboratory monitoring, with recent results demonstrating stable hemoglobin (most recently 12.2 g/dL), normal leukocyte and platelet counts, and normal iron  studies except for ferritin, which is pending. She previously required transfusion following surgery in May 2025 and received one dose of intravenous iron , but her counts have remained stable since. She is not currently taking oral iron  due to gastrointestinal intolerance but continues folic acid  supplementation. She reports that she can tell when her blood drops because she experiences fatigue and weakness, but currently feels fine in that regard. She did not mention abnormal bleeding or bruising. She notes that her anemia tends to worsen perioperatively, and she previously required transfusion following surgery in May 2025.  Renal function, previously abnormal, has normalized with a current creatinine of 0.89 mg/dL. She is on chronic amoxicillin  therapy for infectious disease management, with a recent dose reduction. She expresses concern about potential infection recurrence if antibiotics are discontinued. Inflammatory markers (CRP, ESR) remain elevated. She previously experienced a fracture and skin ulcer, both of which have healed.  Her rheumatoid arthritis is currently flaring, with ongoing  neuropathy and joint pain. She restarted hydroxychloroquine  one month ago and intermittently uses ibuprofen  for symptom control, despite concerns regarding renal function. Acetaminophen  is ineffective for her pain. She continues to experience significant symptoms from rheumatoid arthritis and neuropathy, though there are no acute complications at this time.  She has recently moved to an independent living facility and is considering transitioning to a new primary care provider due to accessibility issues.     SUMMARY OF HEMATOLOGIC HISTORY:  74 y.o. 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  The patient reports feeling fatigued and cold, which are common symptoms of anemia. The patient's anemia was first noticed in November 2023 following surgery for a fracture. The patient's hemoglobin levels have not returned to normal since the surgery. The patient also reports some issues with feeling full quickly after eating. The patient's RA medications were stopped due to concerns about her wound healing.   On 05/17/2023, labs showed hemoglobin of 9.7, hematocrit 32.3, MCV 82.  White count, platelet count were normal.  Given persistent anemia, referral was sent to us  for further evaluation.   She had not noticed any recent bleeding such as epistaxis, hematuria or hematochezia   She had no prior history or diagnosis of cancer. Her age appropriate screening programs are up-to-date.   On her initial visit with us  on 06/01/2023, labs showed hemoglobin of 9.5, hematocrit 31.5, MCV 83.1.  White count 6800 with normal differential.  Platelet count normal at 289,000.  CMP  showed BUN of 40, creatinine 1.26, normal LFTs.  B12, LDH normal.  Folate low at 5.8.  Iron  saturation decreased at 9%.  Ferritin normal.  Workup for monoclonal gammopathy came back negative.    Started her on folic acid  1 mg daily.   Also given mild iron  deficiency as noted by decreased iron  saturation, she was treated with IV iron  with Venofer  200 mg x 5 doses given weekly.  Patient later had Enterococcus infection, knee surgery, ankle fracture and multiple other related complications.  Currently in rehab.  Completed IV antibiotic course and is currently on oral antibiotics.   I have reviewed the past medical history, past surgical history, social history and family history with the patient and they are unchanged from previous note.  ALLERGIES: She is allergic to sulfa antibiotics, cymbalta  [duloxetine  hcl], demerol, ivp dye [iodinated contrast media], nsaids, and adhesive [tape].  MEDICATIONS:  Current Outpatient Medications  Medication Sig Dispense Refill   amoxicillin  (AMOXIL ) 500 MG capsule Take 1 capsule (500 mg total) by mouth 3 (three) times daily. 90 capsule 5   aspirin  EC 81 MG tablet Take 1 tablet (81 mg total) by mouth daily. Swallow whole. 30 tablet 12   cetirizine  (ZYRTEC ) 10 MG tablet TAKE 1 TABLET (10 MG TOTAL) BY MOUTH DAILY. 90 tablet 1   colestipol  (COLESTID ) 1 g tablet Take 1-2 g by mouth daily as needed (ibs flare up).     cycloSPORINE  (RESTASIS ) 0.05 % ophthalmic emulsion Place 2 drops into both eyes 2 (two) times daily as needed (dry eyes).     diphenoxylate -atropine  (LOMOTIL ) 2.5-0.025 MG tablet as needed.     DULoxetine  (CYMBALTA ) 30 MG capsule Take 1 capsule (30 mg total) by mouth daily. 30 capsule 11   fenofibrate  (TRICOR ) 48 MG tablet Take 1 tablet (48 mg total) by mouth daily. 90 tablet 1   ferrous sulfate  325 (65 FE) MG tablet Take 1 tablet (325 mg total) by mouth every other day. 14 tablet 0   folic acid  (FOLVITE ) 1 MG tablet Take 1 tablet (1 mg total) by mouth daily. 30 tablet 5   gabapentin  (NEURONTIN ) 300 MG capsule Take 2 capsules (600 mg total) by mouth 2 (two) times daily. 360 capsule 3   hydroxychloroquine  (PLAQUENIL ) 200 MG tablet Take 1  tablet (200 mg total) by mouth daily. 90 tablet 0   hyoscyamine  (ANASPAZ ) 0.125 MG TBDP disintergrating tablet Place 0.125 mg under the tongue every 6 (six) hours as needed for cramping.     ibuprofen  (ADVIL ) 800 MG tablet as needed.     LORazepam  (ATIVAN ) 0.5 MG tablet Take 1 tablet (0.5 mg total) by mouth at bedtime as needed for sleep. At bedtime prn 3 tablet 0   methocarbamol  (ROBAXIN ) 500 MG tablet Take 1 tablet (500 mg total) by mouth every 6 (six) hours as needed for muscle spasms. 40 tablet 0   metoprolol  succinate (TOPROL -XL) 25 MG 24 hr tablet TAKE 1/2 TABLET BY MOUTH (12.5 MG TOTAL) DAILY 45 tablet 3   Multiple Vitamin (MULTIVITAMIN WITH MINERALS) TABS tablet Take 1 tablet by mouth daily.     NONFORMULARY OR COMPOUNDED ITEM Apply 1 application  topically daily as needed (irritation). Triamcinolone  0.1% & Silvadene cream 1:1- Apply as directed to affected areas as needed     nystatin  cream (MYCOSTATIN ) Apply 1 Application topically daily. (Patient taking differently: Apply 1 Application topically as needed.)     omeprazole (PRILOSEC) 40 MG capsule Take 40 mg by mouth daily as needed (acid reflux).  ondansetron  (ZOFRAN ) 4 MG tablet Take 1 tablet (4 mg total) by mouth every 6 (six) hours as needed for nausea. 20 tablet 0   oxyCODONE  (OXY IR/ROXICODONE ) 5 MG immediate release tablet Take 1 tablet (5 mg total) by mouth every 6 (six) hours as needed for up to 5 days for severe pain (pain score 7-10) or breakthrough pain.     polyethylene glycol (MIRALAX ) 17 g packet Take 17 g by mouth daily as needed for mild constipation.     potassium chloride  (KLOR-CON ) 10 MEQ tablet Take 2 tablets (20 mEq total) by mouth daily. 180 tablet 3   promethazine  (PHENERGAN ) 25 MG tablet as needed.     rosuvastatin  (CRESTOR ) 10 MG tablet Take 1 tablet (10 mg total) by mouth daily. 90 tablet 1   spironolactone  (ALDACTONE ) 25 MG tablet TAKE 1 TABLET (25 MG TOTAL) BY MOUTH DAILY. 90 tablet 0   torsemide  40 MG TABS  Take 40 mg by mouth daily. 30 tablet 0   No current facility-administered medications for this visit.     REVIEW OF SYSTEMS:    Review of Systems - Oncology  All other pertinent systems were reviewed with the patient and are negative.  PHYSICAL EXAMINATION:   Onc Performance Status - 08/29/24 1153       ECOG Perf Status   ECOG Perf Status Ambulatory and capable of all selfcare but unable to carry out any work activities.  Up and about more than 50% of waking hours      KPS SCALE   KPS % SCORE Normal activity with effort, some s/s of disease           Vitals:   08/29/24 1122  BP: (!) 138/52  Pulse: 76  Resp: 17  Temp: 97.9 F (36.6 C)  SpO2: 97%   Filed Weights   08/29/24 1122  Weight: 215 lb 4.8 oz (97.7 kg)    Physical Exam Constitutional:      General: She is not in acute distress.    Appearance: Normal appearance.  HENT:     Head: Normocephalic and atraumatic.  Cardiovascular:     Rate and Rhythm: Normal rate.  Pulmonary:     Effort: Pulmonary effort is normal. No respiratory distress.  Abdominal:     General: There is no distension.  Neurological:     Mental Status: She is alert and oriented to person, place, and time.  Psychiatric:        Mood and Affect: Mood normal.        Behavior: Behavior normal.     LABORATORY DATA:   I have reviewed the data as listed.  Results for orders placed or performed in visit on 08/29/24  Folate  Result Value Ref Range   Folate 9.2 >5.9 ng/mL  Ferritin  Result Value Ref Range   Ferritin 136 11 - 307 ng/mL  Iron  and Iron  Binding Capacity ([facility] only)  Result Value Ref Range   Iron  53 28 - 170 ug/dL   TIBC 723 749 - 549 ug/dL   Saturation Ratios 19 10.4 - 31.8 %   UIBC 223 ug/dL  CMP (Cancer Center only)  Result Value Ref Range   Sodium 139 135 - 145 mmol/L   Potassium 4.2 3.5 - 5.1 mmol/L   Chloride 106 98 - 111 mmol/L   CO2 21 (L) 22 - 32 mmol/L   Glucose, Bld 126 (H) 70 - 99 mg/dL   BUN 19 8  - 23 mg/dL   Creatinine  0.89 0.44 - 1.00 mg/dL   Calcium  9.2 8.9 - 10.3 mg/dL   Total Protein 6.9 6.5 - 8.1 g/dL   Albumin 3.8 3.5 - 5.0 g/dL   AST 16 15 - 41 U/L   ALT 8 0 - 44 U/L   Alkaline Phosphatase 155 (H) 38 - 126 U/L   Total Bilirubin 0.4 0.0 - 1.2 mg/dL   GFR, Estimated >39 >39 mL/min   Anion gap 13 5 - 15  CBC with Differential (Cancer Center Only)  Result Value Ref Range   WBC Count 6.3 4.0 - 10.5 K/uL   RBC 4.39 3.87 - 5.11 MIL/uL   Hemoglobin 12.2 12.0 - 15.0 g/dL   HCT 62.7 63.9 - 53.9 %   MCV 84.7 80.0 - 100.0 fL   MCH 27.8 26.0 - 34.0 pg   MCHC 32.8 30.0 - 36.0 g/dL   RDW 85.8 88.4 - 84.4 %   Platelet Count 237 150 - 400 K/uL   nRBC 0.0 0.0 - 0.2 %   Neutrophils Relative % 72 %   Neutro Abs 4.6 1.7 - 7.7 K/uL   Lymphocytes Relative 16 %   Lymphs Abs 1.0 0.7 - 4.0 K/uL   Monocytes Relative 8 %   Monocytes Absolute 0.5 0.1 - 1.0 K/uL   Eosinophils Relative 2 %   Eosinophils Absolute 0.1 0.0 - 0.5 K/uL   Basophils Relative 1 %   Basophils Absolute 0.1 0.0 - 0.1 K/uL   Immature Granulocytes 1 %   Abs Immature Granulocytes 0.05 0.00 - 0.07 K/uL     RADIOGRAPHIC STUDIES:  No recent pertinent imaging studies available to review.  Orders Placed This Encounter  Procedures   CBC with Differential (Cancer Center Only)    Standing Status:   Future    Expiration Date:   08/29/2025   CMP (Cancer Center only)    Standing Status:   Future    Expiration Date:   08/29/2025   Iron  and Iron  Binding Capacity ([facility] only)    Standing Status:   Future    Expiration Date:   08/29/2025   Ferritin    Standing Status:   Future    Expiration Date:   08/29/2025   Folate    Standing Status:   Future    Expiration Date:   08/29/2025     Future Appointments  Date Time Provider Department Center  09/23/2024  1:45 PM Neda Jennet LABOR, MD [facility] [address]  10/09/2024  1:30 PM Cheryl Waddell HERO, PA-C [facility] None  10/22/2024 11:15 AM [name] Shiner, MD [facility]  11/27/2024  2:00 PM Sherryl Bouchard, NP [facility] None  06/25/2025  2:20 PM [facility]NUAL WELLNESS VISIT [facility]  09/02/2025 11:15 AM [facility] None  09/02/2025 11:45 AM Arul Farabee, [place], MD [facility] None     This document was completed utilizing speech recognition software. Grammatical errors, random word insertions, pronoun errors, and incomplete sentences are an occasional consequence of this system due to software limitations, ambient noise, and hardware issues. Any formal questions or concerns about the content, text or information contained within the body of this dictation should be directly addressed to the provider for clarification.  "

## 2024-08-29 NOTE — Assessment & Plan Note (Addendum)
 New onset since November 2023, possibly secondary to surgery. No active bleeding noted. Possible contributing factors include chronic non-healing wound and rheumatoid arthritis. No signs of blood loss from GI tract or other sources. -On her initial visit with us  on 06/01/2023, labs showed hemoglobin of 9.5, hematocrit 31.5, MCV 83.1.  White count 6800 with normal differential.  Platelet count normal at 289,000.  CMP showed BUN of 40, creatinine 1.26, normal LFTs.  B12, LDH normal.  Folate low at 5.8.  Iron  saturation decreased at 9%.  Ferritin normal.  Workup for monoclonal gammopathy came back negative.  Started her on folic acid  1 mg daily.  Also given mild iron  deficiency as noted by decreased iron  saturation, she was treated with IV iron  using Venofer  x 5 doses.  She has maintained on folic acid  daily and tolerating this well. Iron  supplements caused constipation and hence she stopped taking them.   Hemoglobin is currently 12.2, normal.  Normal MCV.  White count and platelet count are also within normal limits.  Iron  studies show no evidence of iron  deficiency, ferritin normal at 136. No indication for IV iron  currently. She was advised to continue folic acid  supplements.

## 2024-08-29 NOTE — Assessment & Plan Note (Signed)
 This is completely healed.  She is off of wound VAC.

## 2024-09-23 ENCOUNTER — Ambulatory Visit: Admitting: Pulmonary Disease

## 2024-10-09 ENCOUNTER — Ambulatory Visit: Admitting: Physician Assistant

## 2024-10-22 ENCOUNTER — Ambulatory Visit: Admitting: Infectious Diseases

## 2024-11-27 ENCOUNTER — Telehealth: Admitting: Adult Health

## 2025-06-25 ENCOUNTER — Ambulatory Visit

## 2025-09-02 ENCOUNTER — Inpatient Hospital Stay

## 2025-09-02 ENCOUNTER — Inpatient Hospital Stay: Admitting: Oncology
